# Patient Record
Sex: Male | Born: 1955
Health system: Southern US, Community
[De-identification: ages and names within clinical notes are randomized; demographics above are authoritative.]

## PROBLEM LIST (undated history)

## (undated) DIAGNOSIS — Z87442 Personal history of urinary calculi: Secondary | ICD-10-CM

## (undated) DIAGNOSIS — E1140 Type 2 diabetes mellitus with diabetic neuropathy, unspecified: Secondary | ICD-10-CM

## (undated) DIAGNOSIS — G709 Myoneural disorder, unspecified: Secondary | ICD-10-CM

## (undated) DIAGNOSIS — G629 Polyneuropathy, unspecified: Secondary | ICD-10-CM

## (undated) DIAGNOSIS — G8929 Other chronic pain: Secondary | ICD-10-CM

## (undated) DIAGNOSIS — E785 Hyperlipidemia, unspecified: Secondary | ICD-10-CM

## (undated) DIAGNOSIS — L4050 Arthropathic psoriasis, unspecified: Secondary | ICD-10-CM

## (undated) DIAGNOSIS — K759 Inflammatory liver disease, unspecified: Secondary | ICD-10-CM

## (undated) DIAGNOSIS — L309 Dermatitis, unspecified: Secondary | ICD-10-CM

## (undated) DIAGNOSIS — N200 Calculus of kidney: Secondary | ICD-10-CM

## (undated) DIAGNOSIS — I10 Essential (primary) hypertension: Secondary | ICD-10-CM

## (undated) DIAGNOSIS — G913 Post-traumatic hydrocephalus, unspecified: Secondary | ICD-10-CM

## (undated) DIAGNOSIS — R7989 Other specified abnormal findings of blood chemistry: Secondary | ICD-10-CM

## (undated) DIAGNOSIS — M1990 Unspecified osteoarthritis, unspecified site: Secondary | ICD-10-CM

## (undated) DIAGNOSIS — M19132 Post-traumatic osteoarthritis, left wrist: Secondary | ICD-10-CM

## (undated) DIAGNOSIS — R519 Headache, unspecified: Secondary | ICD-10-CM

## (undated) DIAGNOSIS — K219 Gastro-esophageal reflux disease without esophagitis: Secondary | ICD-10-CM

## (undated) DIAGNOSIS — T8859XA Other complications of anesthesia, initial encounter: Secondary | ICD-10-CM

## (undated) DIAGNOSIS — R931 Abnormal findings on diagnostic imaging of heart and coronary circulation: Secondary | ICD-10-CM

## (undated) DIAGNOSIS — K760 Fatty (change of) liver, not elsewhere classified: Secondary | ICD-10-CM

## (undated) DIAGNOSIS — K5792 Diverticulitis of intestine, part unspecified, without perforation or abscess without bleeding: Secondary | ICD-10-CM

## (undated) DIAGNOSIS — L409 Psoriasis, unspecified: Secondary | ICD-10-CM

## (undated) DIAGNOSIS — R51 Headache: Secondary | ICD-10-CM

## (undated) DIAGNOSIS — G4730 Sleep apnea, unspecified: Secondary | ICD-10-CM

## (undated) DIAGNOSIS — Z5189 Encounter for other specified aftercare: Secondary | ICD-10-CM

## (undated) DIAGNOSIS — F32A Depression, unspecified: Secondary | ICD-10-CM

## (undated) DIAGNOSIS — T4145XA Adverse effect of unspecified anesthetic, initial encounter: Secondary | ICD-10-CM

## (undated) DIAGNOSIS — F329 Major depressive disorder, single episode, unspecified: Secondary | ICD-10-CM

## (undated) DIAGNOSIS — G4700 Insomnia, unspecified: Secondary | ICD-10-CM

## (undated) DIAGNOSIS — K7460 Unspecified cirrhosis of liver: Secondary | ICD-10-CM

## (undated) DIAGNOSIS — K635 Polyp of colon: Secondary | ICD-10-CM

## (undated) DIAGNOSIS — K259 Gastric ulcer, unspecified as acute or chronic, without hemorrhage or perforation: Secondary | ICD-10-CM

## (undated) DIAGNOSIS — E349 Endocrine disorder, unspecified: Secondary | ICD-10-CM

## (undated) DIAGNOSIS — R945 Abnormal results of liver function studies: Secondary | ICD-10-CM

## (undated) HISTORY — PX: JOINT REPLACEMENT: SHX530

## (undated) HISTORY — DX: Fatty (change of) liver, not elsewhere classified: K76.0

## (undated) HISTORY — DX: Type 2 diabetes mellitus with diabetic neuropathy, unspecified: E11.40

## (undated) HISTORY — DX: Other specified abnormal findings of blood chemistry: R79.89

## (undated) HISTORY — PX: TONSILLECTOMY: SUR1361

## (undated) HISTORY — DX: Abnormal findings on diagnostic imaging of heart and coronary circulation: R93.1

## (undated) HISTORY — PX: BRAIN SURGERY: SHX531

## (undated) HISTORY — DX: Headache: R51

## (undated) HISTORY — DX: Headache, unspecified: R51.9

## (undated) HISTORY — DX: Unspecified cirrhosis of liver: K74.60

## (undated) HISTORY — PX: COLONOSCOPY: SHX174

## (undated) HISTORY — DX: Other chronic pain: G89.29

## (undated) HISTORY — DX: Morbid (severe) obesity due to excess calories: E66.01

## (undated) HISTORY — DX: Polyp of colon: K63.5

## (undated) HISTORY — DX: Abnormal results of liver function studies: R94.5

## (undated) HISTORY — DX: Post-traumatic osteoarthritis, left wrist: M19.132

## (undated) HISTORY — DX: Insomnia, unspecified: G47.00

## (undated) HISTORY — DX: Endocrine disorder, unspecified: E34.9

## (undated) HISTORY — DX: Hyperlipidemia, unspecified: E78.5

## (undated) HISTORY — DX: Encounter for other specified aftercare: Z51.89

## (undated) HISTORY — DX: Depression, unspecified: F32.A

## (undated) HISTORY — DX: Arthropathic psoriasis, unspecified: L40.50

## (undated) HISTORY — DX: Major depressive disorder, single episode, unspecified: F32.9

## (undated) HISTORY — DX: Diverticulitis of intestine, part unspecified, without perforation or abscess without bleeding: K57.92

---
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
## 1978-06-29 HISTORY — PX: BACK SURGERY: SHX140

## 2005-06-29 HISTORY — PX: VENTRICULOPERITONEAL SHUNT: SHX204

## 2008-06-10 ENCOUNTER — Emergency Department (HOSPITAL_BASED_OUTPATIENT_CLINIC_OR_DEPARTMENT_OTHER): Admission: EM | Admit: 2008-06-10 | Discharge: 2008-06-10 | Payer: Self-pay | Admitting: Emergency Medicine

## 2008-06-10 ENCOUNTER — Ambulatory Visit: Payer: Self-pay | Admitting: Interventional Radiology

## 2008-06-29 HISTORY — PX: SHOULDER SURGERY: SHX246

## 2009-01-07 ENCOUNTER — Ambulatory Visit (HOSPITAL_COMMUNITY): Admission: RE | Admit: 2009-01-07 | Discharge: 2009-01-07 | Payer: Self-pay | Admitting: Orthopaedic Surgery

## 2009-06-29 HISTORY — PX: TOTAL HIP ARTHROPLASTY: SHX124

## 2009-09-12 ENCOUNTER — Ambulatory Visit (HOSPITAL_COMMUNITY): Admission: RE | Admit: 2009-09-12 | Discharge: 2009-09-12 | Payer: Self-pay | Admitting: Orthopaedic Surgery

## 2009-12-25 ENCOUNTER — Encounter: Admission: RE | Admit: 2009-12-25 | Discharge: 2009-12-25 | Payer: Self-pay | Admitting: Orthopaedic Surgery

## 2010-01-21 ENCOUNTER — Inpatient Hospital Stay (HOSPITAL_COMMUNITY): Admission: RE | Admit: 2010-01-21 | Discharge: 2010-01-24 | Payer: Self-pay | Admitting: Orthopaedic Surgery

## 2010-09-13 LAB — CBC
HCT: 33.8 % — ABNORMAL LOW (ref 39.0–52.0)
HCT: 38 % — ABNORMAL LOW (ref 39.0–52.0)
Hemoglobin: 11.7 g/dL — ABNORMAL LOW (ref 13.0–17.0)
MCH: 31.8 pg (ref 26.0–34.0)
MCV: 93.4 fL (ref 78.0–100.0)
MCV: 93.4 fL (ref 78.0–100.0)
MCV: 94 fL (ref 78.0–100.0)
Platelets: 138 10*3/uL — ABNORMAL LOW (ref 150–400)
Platelets: 159 10*3/uL (ref 150–400)
Platelets: 167 10*3/uL (ref 150–400)
RBC: 3.62 MIL/uL — ABNORMAL LOW (ref 4.22–5.81)
RBC: 4.04 MIL/uL — ABNORMAL LOW (ref 4.22–5.81)
WBC: 9.1 10*3/uL (ref 4.0–10.5)

## 2010-09-13 LAB — DIFFERENTIAL
Basophils Relative: 0 % (ref 0–1)
Eosinophils Absolute: 0.2 10*3/uL (ref 0.0–0.7)
Lymphocytes Relative: 35 % (ref 12–46)
Lymphs Abs: 2.2 10*3/uL (ref 0.7–4.0)
Neutro Abs: 3.4 10*3/uL (ref 1.7–7.7)

## 2010-09-13 LAB — BASIC METABOLIC PANEL
BUN: 11 mg/dL (ref 6–23)
BUN: 15 mg/dL (ref 6–23)
CO2: 24 mEq/L (ref 19–32)
CO2: 25 mEq/L (ref 19–32)
Calcium: 8.3 mg/dL — ABNORMAL LOW (ref 8.4–10.5)
Chloride: 101 mEq/L (ref 96–112)
Chloride: 104 mEq/L (ref 96–112)
Chloride: 99 mEq/L (ref 96–112)
Creatinine, Ser: 1.02 mg/dL (ref 0.4–1.5)
GFR calc Af Amer: >60 mL/min (ref >60)
GFR calc Af Amer: >60 mL/min (ref >60)
GFR calc Af Amer: >60 mL/min (ref >60)
GFR calc Af Amer: >60 mL/min (ref >60)
GFR calc non Af Amer: >60 mL/min (ref >60)
Glucose, Bld: 122 mg/dL — ABNORMAL HIGH (ref 70–99)
Glucose, Bld: 125 mg/dL — ABNORMAL HIGH (ref 70–99)
Glucose, Bld: 95 mg/dL (ref 70–99)
Potassium: 4.2 mEq/L (ref 3.5–5.1)
Potassium: 4.3 mEq/L (ref 3.5–5.1)
Potassium: 4.3 mEq/L (ref 3.5–5.1)
Sodium: 135 mEq/L (ref 135–145)
Sodium: 137 mEq/L (ref 135–145)

## 2010-09-13 LAB — SURGICAL PCR SCREEN: Staphylococcus aureus: NEGATIVE

## 2010-09-13 LAB — CROSSMATCH
ABO/RH(D): B POS
Antibody Screen: NEGATIVE

## 2010-09-13 LAB — ABO/RH: ABO/RH(D): B POS

## 2010-09-13 LAB — APTT: aPTT: 30 seconds (ref 24–37)

## 2010-09-13 LAB — HEPATIC FUNCTION PANEL: Bilirubin, Direct: 0.2 mg/dL (ref 0.0–0.3)

## 2010-09-13 LAB — PROTIME-INR
INR: 0.98 (ref 0.00–1.49)
Prothrombin Time: 12.9 seconds (ref 11.6–15.2)

## 2011-04-03 LAB — URINALYSIS, ROUTINE W REFLEX MICROSCOPIC
Glucose, UA: NEGATIVE mg/dL
Hgb urine dipstick: NEGATIVE
Ketones, ur: NEGATIVE mg/dL
Specific Gravity, Urine: 1.024 (ref 1.005–1.030)
pH: 6 (ref 5.0–8.0)

## 2011-04-28 ENCOUNTER — Ambulatory Visit (INDEPENDENT_AMBULATORY_CARE_PROVIDER_SITE_OTHER): Payer: Self-pay | Admitting: Pharmacist

## 2011-04-28 ENCOUNTER — Encounter: Payer: Self-pay | Admitting: Pharmacist

## 2011-04-28 VITALS — BP 143/85 | HR 69 | Ht 71.0 in | Wt 266.3 lb

## 2011-04-28 DIAGNOSIS — K219 Gastro-esophageal reflux disease without esophagitis: Secondary | ICD-10-CM

## 2011-04-28 DIAGNOSIS — R339 Retention of urine, unspecified: Secondary | ICD-10-CM

## 2011-04-28 DIAGNOSIS — R519 Headache, unspecified: Secondary | ICD-10-CM | POA: Insufficient documentation

## 2011-04-28 DIAGNOSIS — E349 Endocrine disorder, unspecified: Secondary | ICD-10-CM | POA: Insufficient documentation

## 2011-04-28 DIAGNOSIS — R51 Headache: Secondary | ICD-10-CM

## 2011-04-28 DIAGNOSIS — I10 Essential (primary) hypertension: Secondary | ICD-10-CM

## 2011-04-28 DIAGNOSIS — E291 Testicular hypofunction: Secondary | ICD-10-CM

## 2011-04-28 DIAGNOSIS — L4050 Arthropathic psoriasis, unspecified: Secondary | ICD-10-CM

## 2011-04-28 HISTORY — DX: Endocrine disorder, unspecified: E34.9

## 2011-04-28 HISTORY — DX: Headache, unspecified: R51.9

## 2011-04-28 HISTORY — DX: Gastro-esophageal reflux disease without esophagitis: K21.9

## 2011-04-28 NOTE — Assessment & Plan Note (Addendum)
Pt is fairly knowledgeable on medication doses and indications, and he reports compliance with his medications.   Pt counseled to continue taking medications as prescribed . Pt able to afford medications and report Humira is working well.  Time spent with patient: 15 minute. Patient seen with Volanda Napoleon, PharmD Candidate and Ralene Bathe, Pharmacy Resident

## 2011-04-28 NOTE — Progress Notes (Signed)
  Subjective:    Patient ID: James [name], male    DOB: [birth year], 55 y.o.   MRN: [id number]  HPI Pleasant 19 yoM arrive to clinic for a medication review and update of diagnoses in [facility].    Pt  reports compliance with his medications an d states he can afford Humira and that it is working well for him.   Pt states he began having urinary retention problem after starting Cymbalta (duloxetine) for headaches.  Review of Systems     Objective:   Physical Exam        Assessment & Plan:  Medication reviewed no significant problems identified. Pt is fairly knowledgeable on medication doses and indications, and he reports compliance with his medications.   Encouraged to continue taking medications as prescribed.   Pt states he began having urinary retention problem after starting Cymbalta (duloxetine) for headaches.  He is currently taking Uroxatral (alfuzosin) for urination retention.  It is unclear if this is drug-induced or due to another cause (ie: BPH).  We counseled patient on other medication options for urinary retention, specifically Flomax (tamsulosin), that may have fewer side effect and be more cost-effective.  Pt will bring up this issue with PCP at next office visit.    Time spent with patient: 15 minute. Patient seen with Volanda Napoleon, PharmD Candidate and Ralene Bathe, Pharmacy Resident

## 2011-04-28 NOTE — Assessment & Plan Note (Addendum)
Pt states he began having urinary retention problem after starting Cymbalta (duloxetine) for headaches.  He is currently taking Uroxatral (alfuzosin) for urination retention.  It is unclear if this is drug-induced or due to another cause (ie: BPH).  We counseled patient on other medication options for urinary retention, specifically Flomax (tamsulosin), that may have fewer side effect and be more cost-effective.  Pt will bring up this issue with PCP at next office visit.

## 2011-04-28 NOTE — Patient Instructions (Signed)
Thanks for coming in today, even in the bad weather!  Please consider alternative to Uroxatrol (alfuzosin) - tamsulosin is another option for you.  It was nice to meet you.

## 2011-04-28 NOTE — Progress Notes (Signed)
  Subjective:    Patient ID: James [name], male    DOB: [birth year], 55 y.o.   MRN: [id number]  HPI Reviewed and agree with Dr. Graylin Shiver management.    Review of Systems     Objective:   Physical Exam        Assessment & Plan:

## 2011-07-28 DIAGNOSIS — Z982 Presence of cerebrospinal fluid drainage device: Secondary | ICD-10-CM

## 2011-07-28 HISTORY — DX: Presence of cerebrospinal fluid drainage device: Z98.2

## 2012-04-15 ENCOUNTER — Ambulatory Visit (INDEPENDENT_AMBULATORY_CARE_PROVIDER_SITE_OTHER): Payer: 59 | Admitting: Pharmacist

## 2012-04-15 ENCOUNTER — Encounter: Payer: Self-pay | Admitting: Pharmacist

## 2012-04-15 VITALS — BP 112/75 | HR 73 | Ht 71.0 in | Wt 270.2 lb

## 2012-04-15 DIAGNOSIS — L4050 Arthropathic psoriasis, unspecified: Secondary | ICD-10-CM

## 2012-04-15 NOTE — Progress Notes (Signed)
  Subjective:    Patient ID: James [name], male    DOB: April 23, 1956, 56 y.o.   MRN: [id number]  HPI Patient arrives in good spirits for medication review.   Reports seeing Lanier Prude as primary care provider ([place]).  Reports being diagnosed with psoriatic arthritis for about 30 years and states this is currently under an acceptable level of control.      Review of Systems     Objective:   Physical Exam        Assessment & Plan:  Following medication review, we suggested a change of his immediate release carvedilol to CR formulation since the patient prefers to take it once daily. Complete medication list provided to patient.  Total time in face to face medication review: 25 minutes.  Patient seen with: Wenda Low, PharmD Candidate and Dicky Doe, PharmD, Pharmacy Resident.

## 2012-04-15 NOTE — Progress Notes (Signed)
Patient ID: James [name], male   DOB: 14-Sep-1955, 56 y.o.   MRN: [id number] Reviewed and agree with Dr. Graylin Shiver management and documentation.

## 2012-04-15 NOTE — Patient Instructions (Addendum)
Thank you for coming in today for medication review. Please discuss with your primary care provider regarding changing your carvedilol to a longer-acting form known as carvedilol CR so that you may continue to take that once daily. Please give me a call if you have any questions regarding your medication. Have a great day!

## 2012-04-15 NOTE — Assessment & Plan Note (Signed)
Following medication review, no suggestions for change.  Complete medication list provided to patient.  Total time in face to face medication review: 10 minutes.  Patient seen with: Wenda Low, PharmD Candidate and Dicky Doe, PharmD, Pharmacy Resident.

## 2012-06-10 ENCOUNTER — Ambulatory Visit (HOSPITAL_COMMUNITY): Payer: Medicare Other | Attending: Cardiology

## 2012-06-10 ENCOUNTER — Ambulatory Visit (HOSPITAL_COMMUNITY): Payer: 59 | Attending: Cardiology | Admitting: Radiology

## 2012-06-10 ENCOUNTER — Other Ambulatory Visit (HOSPITAL_COMMUNITY): Payer: Self-pay | Admitting: Radiology

## 2012-06-10 ENCOUNTER — Encounter: Payer: Self-pay | Admitting: Cardiology

## 2012-06-10 DIAGNOSIS — R0602 Shortness of breath: Secondary | ICD-10-CM

## 2012-06-10 DIAGNOSIS — G4733 Obstructive sleep apnea (adult) (pediatric): Secondary | ICD-10-CM | POA: Insufficient documentation

## 2012-06-10 DIAGNOSIS — R079 Chest pain, unspecified: Secondary | ICD-10-CM | POA: Insufficient documentation

## 2012-06-10 DIAGNOSIS — R0609 Other forms of dyspnea: Secondary | ICD-10-CM | POA: Insufficient documentation

## 2012-06-10 DIAGNOSIS — R0989 Other specified symptoms and signs involving the circulatory and respiratory systems: Secondary | ICD-10-CM | POA: Insufficient documentation

## 2012-06-10 DIAGNOSIS — E785 Hyperlipidemia, unspecified: Secondary | ICD-10-CM | POA: Insufficient documentation

## 2012-06-10 DIAGNOSIS — I10 Essential (primary) hypertension: Secondary | ICD-10-CM | POA: Insufficient documentation

## 2012-06-10 DIAGNOSIS — Z8249 Family history of ischemic heart disease and other diseases of the circulatory system: Secondary | ICD-10-CM | POA: Insufficient documentation

## 2012-06-10 NOTE — Progress Notes (Signed)
Stress Echocardiogram performed.

## 2012-07-27 ENCOUNTER — Ambulatory Visit (HOSPITAL_COMMUNITY): Payer: 59 | Attending: Cardiology | Admitting: Radiology

## 2012-07-27 VITALS — BP 141/87 | Ht 71.5 in | Wt 270.0 lb

## 2012-07-27 DIAGNOSIS — R0989 Other specified symptoms and signs involving the circulatory and respiratory systems: Secondary | ICD-10-CM | POA: Insufficient documentation

## 2012-07-27 DIAGNOSIS — R0789 Other chest pain: Secondary | ICD-10-CM | POA: Insufficient documentation

## 2012-07-27 DIAGNOSIS — R0609 Other forms of dyspnea: Secondary | ICD-10-CM | POA: Insufficient documentation

## 2012-07-27 DIAGNOSIS — R079 Chest pain, unspecified: Secondary | ICD-10-CM

## 2012-07-27 DIAGNOSIS — I10 Essential (primary) hypertension: Secondary | ICD-10-CM | POA: Insufficient documentation

## 2012-07-27 MED ORDER — TECHNETIUM TC 99M SESTAMIBI GENERIC - CARDIOLITE
11.0000 | Freq: Once | INTRAVENOUS | Status: AC | PRN
  Administered 2012-07-27: 11 via INTRAVENOUS

## 2012-07-27 MED ORDER — REGADENOSON 0.4 MG/5ML IV SOLN
0.4000 mg | Freq: Once | INTRAVENOUS | Status: AC
  Administered 2012-07-27: 0.4 mg via INTRAVENOUS

## 2012-07-27 MED ORDER — TECHNETIUM TC 99M SESTAMIBI GENERIC - CARDIOLITE
33.0000 | Freq: Once | INTRAVENOUS | Status: AC | PRN
  Administered 2012-07-27: 33 via INTRAVENOUS

## 2012-07-27 NOTE — Progress Notes (Signed)
[facility] [address] [place], [place] [address] [phone]    Cardiology Nuclear Med Study  James [name] is a 57 y.o. male     MRN : [id number]     DOB: [birth year]  Procedure Date: 07/27/2012  Nuclear Med Background Indication for Stress Test:  Evaluation for Ischemia History:  06/14/12 STRESS ECHO: NL Cardiac Risk Factors: Family History - CAD, Hypertension and Lipids  Symptoms:  Chest Pain, Chest Pressure, DOE, Fatigue and SOB   Nuclear Pre-Procedure Caffeine/Decaff Intake:  None > 12 hrs NPO After: 8:00pm   Lungs:  clear O2 Sat: 95% on room air. IV 0.9% NS with Angio Cath:  20g  IV Site: R Antecubital x 1, tolerated well IV Started by:  Irven Baltimore, RN  Chest Size (in):  48 Cup Size: n/a  Height: 5' 11.5" (1.816 m)  Weight:  270 lb (122.471 kg)  BMI:  Body mass index is 37.13 kg/(m^2). Tech Comments:  Held Carvedilol x 36 hrs    Nuclear Med Study 1 or 2 day study: 1 day  Stress Test Type:  Treadmill/Lexiscan  Reading MD: Darlin Coco, MD  Order Authorizing Provider:  Rubie Maid, MD, and L. Beane, PAC  Resting Radionuclide: Technetium 8mSestamibi  Resting Radionuclide Dose: 11.0 mCi   Stress Radionuclide:  Technetium 986mestamibi  Stress Radionuclide Dose: 33.0 mCi           Stress Protocol Rest HR: 82 Stress HR: 125  Rest BP: 141/87 Stress BP: 151/90  Exercise Time (min): n/a METS: n/a   Predicted Max HR: 166 bpm % Max HR: 75.3 bpm Rate Pressure Product: [id number]  Dose of Adenosine (mg):  n/a Dose of Lexiscan: 0.4 mg  Dose of Atropine (mg): n/a Dose of Dobutamine: n/a mcg/kg/min (at max HR)  Stress Test Technologist: SaPerrin MalteseEMT-P  Nuclear Technologist:  ToAnnye RuskCNMT     Rest Procedure:  Myocardial perfusion imaging was performed at rest 45 minutes following the intravenous administration of Technetium 9911mstamibi. Rest ECG: NSR - Normal EKG  Stress Procedure:  The patient received IV  Lexiscan 0.4 mg over 15-seconds with concurrent low level exercise and then Technetium 65m30mtamibi was injected at 30-seconds while the patient continued walking one more minute. The patient had sob and was lt. Headed with LexiUnion Pacific Corporationuantitative spect images were obtained after a 45-minute delay. Stress ECG: No significant change from baseline ECG  QPS Raw Data Images:  Normal; no motion artifact; normal heart/lung ratio. Stress Images:  Normal homogeneous uptake in all areas of the myocardium. Rest Images:  Normal homogeneous uptake in all areas of the myocardium. Subtraction (SDS):  No evidence of ischemia. Transient Ischemic Dilatation (Normal <1.22):  1.08 Lung/Heart Ratio (Normal <0.45):  0.26  Quantitative Gated Spect Images QGS EDV:  118 ml QGS ESV:  56 ml  Impression Exercise Capacity:  Lexiscan with low level exercise. BP Response:  Normal blood pressure response. Clinical Symptoms:  There is dyspnea. ECG Impression:  No significant ST segment change suggestive of ischemia. Comparison with Prior Nuclear Study: No images to compare  Overall Impression:  Normal stress nuclear study.  LV Ejection Fraction: 52%.  LV Wall Motion:  NL LV Function; NL Wall Motion  [employer]

## 2012-07-28 ENCOUNTER — Encounter (HOSPITAL_COMMUNITY): Payer: Self-pay | Admitting: Family Medicine

## 2013-03-05 ENCOUNTER — Emergency Department (HOSPITAL_BASED_OUTPATIENT_CLINIC_OR_DEPARTMENT_OTHER)
Admission: EM | Admit: 2013-03-05 | Discharge: 2013-03-05 | Disposition: A | Discharge status: Home / Self Care | Payer: 59 | Attending: Emergency Medicine | Admitting: Emergency Medicine

## 2013-03-05 ENCOUNTER — Encounter (HOSPITAL_BASED_OUTPATIENT_CLINIC_OR_DEPARTMENT_OTHER): Payer: Self-pay | Admitting: *Deleted

## 2013-03-05 DIAGNOSIS — E119 Type 2 diabetes mellitus without complications: Secondary | ICD-10-CM | POA: Insufficient documentation

## 2013-03-05 DIAGNOSIS — H16139 Photokeratitis, unspecified eye: Secondary | ICD-10-CM | POA: Insufficient documentation

## 2013-03-05 DIAGNOSIS — Z8669 Personal history of other diseases of the nervous system and sense organs: Secondary | ICD-10-CM | POA: Insufficient documentation

## 2013-03-05 DIAGNOSIS — Z79899 Other long term (current) drug therapy: Secondary | ICD-10-CM | POA: Insufficient documentation

## 2013-03-05 DIAGNOSIS — I10 Essential (primary) hypertension: Secondary | ICD-10-CM | POA: Insufficient documentation

## 2013-03-05 DIAGNOSIS — H16133 Photokeratitis, bilateral: Secondary | ICD-10-CM

## 2013-03-05 DIAGNOSIS — K219 Gastro-esophageal reflux disease without esophagitis: Secondary | ICD-10-CM | POA: Insufficient documentation

## 2013-03-05 DIAGNOSIS — Z8711 Personal history of peptic ulcer disease: Secondary | ICD-10-CM | POA: Insufficient documentation

## 2013-03-05 HISTORY — DX: Gastro-esophageal reflux disease without esophagitis: K21.9

## 2013-03-05 HISTORY — DX: Gastric ulcer, unspecified as acute or chronic, without hemorrhage or perforation: K25.9

## 2013-03-05 HISTORY — DX: Essential (primary) hypertension: I10

## 2013-03-05 MED ORDER — ERYTHROMYCIN 5 MG/GM OP OINT
TOPICAL_OINTMENT | OPHTHALMIC | Status: AC
  Filled 2013-03-05: qty 3.5

## 2013-03-05 MED ORDER — TETRACAINE HCL 0.5 % OP SOLN
OPHTHALMIC | Status: AC
  Administered 2013-03-05: 08:00:00
  Filled 2013-03-05: qty 2

## 2013-03-05 MED ORDER — ERYTHROMYCIN 5 MG/GM OP OINT
TOPICAL_OINTMENT | Freq: Every day | OPHTHALMIC | Status: DC
  Administered 2013-03-05: 09:00:00 via OPHTHALMIC

## 2013-03-05 MED ORDER — FLUORESCEIN SODIUM 1 MG OP STRP
ORAL_STRIP | OPHTHALMIC | Status: AC
  Administered 2013-03-05: 08:00:00
  Filled 2013-03-05: qty 1

## 2013-03-05 NOTE — ED Provider Notes (Signed)
CSN: [id number]     Arrival date & time 03/05/13  4854 History   First MD Initiated Contact with Patient 03/05/13 0700     Chief Complaint  Patient presents with  . Eye Pain   (Consider location/radiation/quality/duration/timing/severity/associated sxs/prior Treatment) HPI Comments: Patient complains of onset of bilateral eye burning and watering awoke him from sleep at about 0400 am.  He went to sleep around 0200 and did not have symptoms at that time. He has no known exposures to [employer], soldering, or sick contacts, but did install a uv light for raising fish last night. He states his vision is blurry.  No fever or chills, some rhinorrhea but no coughing sneezing, or fever.    Patient is a 57 y.o. male presenting with eye pain. The history is provided by the patient.  Eye Pain This is a new problem. The current episode started 1 to 2 hours ago. The problem occurs constantly. The problem has not changed since onset.Exacerbated by: Worsened by light.  Nothing relieves the symptoms. He has tried nothing for the symptoms. The treatment provided no relief.    Past Medical History  Diagnosis Date  . Hypertension   . Diabetes mellitus without complication     borderline per primary MD  . Stomach ulcer   . Acid reflux   . Neuropathy of both feet    Past Surgical History  Procedure Laterality Date  . Brain stent    . Total hip arthroplasty    . Shoulder surgery Left   . Back surgery     History reviewed. No pertinent family history. History  Substance Use Topics  . Smoking status: Never Smoker   . Smokeless tobacco: Never Used     Comment: Tried when younger.    . Alcohol Use: No    Review of Systems  Eyes: Positive for pain.  All other systems reviewed and are negative.    Allergies  Morphine and related and Sulfa drugs cross reactors  Home Medications   Current Outpatient Rx  Name  Route  Sig  Dispense  Refill  . Armodafinil (NUVIGIL) 250 MG  tablet   Oral   Take 250 mg by mouth daily.         Marland Kitchen gabapentin (NEURONTIN) 100 MG capsule   Oral   Take 100 mg by mouth 3 (three) times daily.         . metFORMIN (GLUCOPHAGE) 500 MG tablet   Oral   Take 500 mg by mouth daily.         Marland Kitchen testosterone cypionate (DEPOTESTOTERONE CYPIONATE) 200 MG/ML injection   Intramuscular   Inject into the muscle every 14 (fourteen) days.         Marland Kitchen adalimumab (HUMIRA) 40 MG/0.8ML injection   Subcutaneous   Inject 0.8 mLs (40 mg total) into the skin every 14 (fourteen) days.         Marland Kitchen alfuzosin (UROXATRAL) 10 MG 24 hr tablet   Oral   Take 1 tablet (10 mg total) by mouth daily. Take 1 tablet (65m total) by mouth daily with food.         . carvedilol (COREG) 6.25 MG tablet   Oral   Take 1 tablet (6.25 mg total) by mouth 2 (two) times daily.         . DULoxetine (CYMBALTA) 60 MG capsule   Oral   Take 1 capsule (60 mg total) by mouth daily. Take 2 capsule by mouth once daily         .  losartan (COZAAR) 100 MG tablet   Oral   Take 1 tablet (100 mg total) by mouth daily.         . Naproxen Sodium (ALEVE) 220 MG CAPS   Oral   Take 220 mg by mouth at bedtime as needed.         Marland Kitchen omeprazole (PRILOSEC) 40 MG capsule   Oral   Take 1 capsule (40 mg total) by mouth 2 (two) times daily.         . Testosterone 30 MG/ACT SOLN   Transdermal   Place 30 mg onto the skin daily.          BP 120/83  Pulse 76  Temp(Src) 98 F (36.7 C) (Oral)  Resp 20  Ht 5' 11"  (1.803 m)  Wt 270 lb (122.471 kg)  BMI 37.67 kg/m2  SpO2 97% Physical Exam  Nursing note and vitals reviewed. Constitutional: He appears well-developed and well-nourished.  HENT:  Head: Normocephalic and atraumatic.  Right Ear: External ear normal.  Left Ear: External ear normal.  Eyes: EOM and lids are normal. Pupils are equal, round, and reactive to light. Right conjunctiva is injected. Left conjunctiva is injected.  Slit lamp exam:      The right eye  shows fluorescein uptake. The right eye shows no corneal abrasion, no corneal ulcer, no foreign body, no hyphema and no hypopyon.       The left eye shows no corneal abrasion, no corneal ulcer, no foreign body, no hyphema, no hypopyon and no fluorescein uptake.   mildDiffuse corneal edema, right cornea with some 6 o clock fluorescein uptake.   Visual acuity od 20/30 os 20/40  Neck: Normal range of motion. Neck supple.    ED Course  Procedures (including critical care time) Labs Review Labs Reviewed - No data to display Imaging Review No results found.  MDM  Patient with exposure to uv light and exam consistent with solar UV photokeratitis.  Patient will be treated with oral analgesics and lubricant ointment.  Patient advised to follow up with his eye doctor in 48 hours, avoid light, and future eye protection.     Shaune Pollack, MD 03/05/13 [phone]

## 2013-03-05 NOTE — ED Notes (Signed)
Patient states he woke this A.M. To eye pain, unable to open eyelids w/out pain. Patient is accompanied by wife who states patient may be having reaction to meds. Patient's wife has large bag of meds. I asked her to explain to nurse. I took patient by wheelchair to get a visual acuity test. Patient was unable to open eyelids due to pain. Patient states he has no other pain.

## 2013-03-29 DIAGNOSIS — K5792 Diverticulitis of intestine, part unspecified, without perforation or abscess without bleeding: Secondary | ICD-10-CM | POA: Insufficient documentation

## 2013-03-29 HISTORY — DX: Diverticulitis of intestine, part unspecified, without perforation or abscess without bleeding: K57.92

## 2013-04-24 ENCOUNTER — Encounter (HOSPITAL_BASED_OUTPATIENT_CLINIC_OR_DEPARTMENT_OTHER): Payer: Self-pay | Admitting: Emergency Medicine

## 2013-04-24 DIAGNOSIS — K219 Gastro-esophageal reflux disease without esophagitis: Secondary | ICD-10-CM | POA: Diagnosis present

## 2013-04-24 DIAGNOSIS — I10 Essential (primary) hypertension: Secondary | ICD-10-CM | POA: Diagnosis present

## 2013-04-24 DIAGNOSIS — G5790 Unspecified mononeuropathy of unspecified lower limb: Secondary | ICD-10-CM | POA: Diagnosis present

## 2013-04-24 DIAGNOSIS — K7689 Other specified diseases of liver: Secondary | ICD-10-CM | POA: Diagnosis present

## 2013-04-24 DIAGNOSIS — Z96649 Presence of unspecified artificial hip joint: Secondary | ICD-10-CM

## 2013-04-24 DIAGNOSIS — L259 Unspecified contact dermatitis, unspecified cause: Secondary | ICD-10-CM | POA: Diagnosis present

## 2013-04-24 DIAGNOSIS — K5732 Diverticulitis of large intestine without perforation or abscess without bleeding: Secondary | ICD-10-CM | POA: Diagnosis present

## 2013-04-24 DIAGNOSIS — E119 Type 2 diabetes mellitus without complications: Secondary | ICD-10-CM | POA: Diagnosis present

## 2013-04-24 DIAGNOSIS — Z982 Presence of cerebrospinal fluid drainage device: Secondary | ICD-10-CM

## 2013-04-24 DIAGNOSIS — G4700 Insomnia, unspecified: Secondary | ICD-10-CM | POA: Diagnosis present

## 2013-04-24 DIAGNOSIS — Z79899 Other long term (current) drug therapy: Secondary | ICD-10-CM

## 2013-04-24 DIAGNOSIS — A419 Sepsis, unspecified organism: Principal | ICD-10-CM | POA: Diagnosis present

## 2013-04-24 DIAGNOSIS — L4050 Arthropathic psoriasis, unspecified: Secondary | ICD-10-CM | POA: Diagnosis present

## 2013-04-24 LAB — URINALYSIS, ROUTINE W REFLEX MICROSCOPIC
Bilirubin Urine: NEGATIVE
Hgb urine dipstick: NEGATIVE
Nitrite: NEGATIVE
Specific Gravity, Urine: 1.019 (ref 1.005–1.030)
Urobilinogen, UA: 1 mg/dL (ref 0.0–1.0)
pH: 5.5 (ref 5.0–8.0)

## 2013-04-24 NOTE — ED Notes (Signed)
Suprapubic pain since 5pm.

## 2013-04-25 ENCOUNTER — Encounter (HOSPITAL_BASED_OUTPATIENT_CLINIC_OR_DEPARTMENT_OTHER): Payer: Self-pay | Admitting: Emergency Medicine

## 2013-04-25 ENCOUNTER — Emergency Department (HOSPITAL_BASED_OUTPATIENT_CLINIC_OR_DEPARTMENT_OTHER): Payer: 59

## 2013-04-25 ENCOUNTER — Inpatient Hospital Stay (HOSPITAL_BASED_OUTPATIENT_CLINIC_OR_DEPARTMENT_OTHER)
Admission: EM | Admit: 2013-04-25 | Discharge: 2013-04-28 | DRG: 872 | Disposition: A | Discharge status: Home / Self Care | Payer: 59 | Attending: Internal Medicine | Admitting: Internal Medicine

## 2013-04-25 DIAGNOSIS — G4700 Insomnia, unspecified: Secondary | ICD-10-CM

## 2013-04-25 DIAGNOSIS — K5732 Diverticulitis of large intestine without perforation or abscess without bleeding: Secondary | ICD-10-CM

## 2013-04-25 DIAGNOSIS — K7460 Unspecified cirrhosis of liver: Secondary | ICD-10-CM

## 2013-04-25 DIAGNOSIS — E119 Type 2 diabetes mellitus without complications: Secondary | ICD-10-CM

## 2013-04-25 DIAGNOSIS — R51 Headache: Secondary | ICD-10-CM

## 2013-04-25 DIAGNOSIS — L4050 Arthropathic psoriasis, unspecified: Secondary | ICD-10-CM

## 2013-04-25 DIAGNOSIS — K219 Gastro-esophageal reflux disease without esophagitis: Secondary | ICD-10-CM

## 2013-04-25 DIAGNOSIS — E349 Endocrine disorder, unspecified: Secondary | ICD-10-CM

## 2013-04-25 DIAGNOSIS — I10 Essential (primary) hypertension: Secondary | ICD-10-CM

## 2013-04-25 DIAGNOSIS — R339 Retention of urine, unspecified: Secondary | ICD-10-CM

## 2013-04-25 DIAGNOSIS — A419 Sepsis, unspecified organism: Secondary | ICD-10-CM

## 2013-04-25 HISTORY — DX: Dermatitis, unspecified: L30.9

## 2013-04-25 HISTORY — DX: Psoriasis, unspecified: L40.9

## 2013-04-25 HISTORY — DX: Calculus of kidney: N20.0

## 2013-04-25 HISTORY — DX: Type 2 diabetes mellitus without complications: E11.9

## 2013-04-25 HISTORY — DX: Diverticulitis of large intestine without perforation or abscess without bleeding: K57.32

## 2013-04-25 HISTORY — DX: Post-traumatic hydrocephalus, unspecified: G91.3

## 2013-04-25 LAB — URINALYSIS, ROUTINE W REFLEX MICROSCOPIC
Bilirubin Urine: NEGATIVE
Glucose, UA: NEGATIVE mg/dL
Hgb urine dipstick: NEGATIVE
Specific Gravity, Urine: 1.031 — ABNORMAL HIGH (ref 1.005–1.030)
pH: 5.5 (ref 5.0–8.0)

## 2013-04-25 LAB — HEPATITIS PANEL, ACUTE
HCV Ab: NEGATIVE
Hepatitis B Surface Ag: NEGATIVE

## 2013-04-25 LAB — COMPREHENSIVE METABOLIC PANEL
AST: 42 U/L — ABNORMAL HIGH (ref 0–37)
Albumin: 4 g/dL (ref 3.5–5.2)
Alkaline Phosphatase: 118 U/L — ABNORMAL HIGH (ref 39–117)
BUN: 17 mg/dL (ref 6–23)
CO2: 26 mEq/L (ref 19–32)
Calcium: 10.3 mg/dL (ref 8.4–10.5)
Chloride: 98 mEq/L (ref 96–112)
GFR calc non Af Amer: 74 mL/min — ABNORMAL LOW (ref >90)
Glucose, Bld: 152 mg/dL — ABNORMAL HIGH (ref 70–99)
Potassium: 4.7 mEq/L (ref 3.5–5.1)
Total Bilirubin: 1.3 mg/dL — ABNORMAL HIGH (ref 0.3–1.2)

## 2013-04-25 LAB — CBC WITH DIFFERENTIAL/PLATELET
Basophils Relative: 0 % (ref 0–1)
Eosinophils Relative: 1 % (ref 0–5)
HCT: 46.8 % (ref 39.0–52.0)
Hemoglobin: 16.3 g/dL (ref 13.0–17.0)
Lymphocytes Relative: 13 % (ref 12–46)
MCHC: 34.8 g/dL (ref 30.0–36.0)
Monocytes Absolute: 1.4 10*3/uL — ABNORMAL HIGH (ref 0.1–1.0)
Monocytes Relative: 9 % (ref 3–12)
Neutro Abs: 11.8 10*3/uL — ABNORMAL HIGH (ref 1.7–7.7)
Neutrophils Relative %: 77 % (ref 43–77)
RBC: 5.23 MIL/uL (ref 4.22–5.81)
WBC: 15.4 10*3/uL — ABNORMAL HIGH (ref 4.0–10.5)

## 2013-04-25 LAB — GLUCOSE, CAPILLARY
Glucose-Capillary: 179 mg/dL — ABNORMAL HIGH (ref 70–99)
Glucose-Capillary: 152 mg/dL — ABNORMAL HIGH (ref 70–99)

## 2013-04-25 LAB — LACTIC ACID, PLASMA: Lactic Acid, Venous: 1.6 mmol/L (ref 0.5–2.2)

## 2013-04-25 LAB — HEMOGLOBIN A1C
Hgb A1c MFr Bld: 7.7 % — ABNORMAL HIGH (ref <5.7)
Mean Plasma Glucose: 174 mg/dL — ABNORMAL HIGH (ref <117)

## 2013-04-25 MED ORDER — SODIUM CHLORIDE 0.9 % IV SOLN
500.0000 mg | Freq: Once | INTRAVENOUS | Status: AC
  Administered 2013-04-25: 500 mg via INTRAVENOUS

## 2013-04-25 MED ORDER — PIPERACILLIN-TAZOBACTAM 3.375 G IVPB
3.3750 g | Freq: Three times a day (TID) | INTRAVENOUS | Status: DC
  Administered 2013-04-25 – 2013-04-27 (×6): 3.375 g via INTRAVENOUS
  Filled 2013-04-25 (×9): qty 50

## 2013-04-25 MED ORDER — IOHEXOL 300 MG/ML  SOLN
50.0000 mL | Freq: Once | INTRAMUSCULAR | Status: AC | PRN
  Administered 2013-04-25: 50 mL via ORAL

## 2013-04-25 MED ORDER — GABAPENTIN 100 MG PO CAPS
100.0000 mg | ORAL_CAPSULE | Freq: Three times a day (TID) | ORAL | Status: DC
  Administered 2013-04-25 – 2013-04-28 (×10): 100 mg via ORAL
  Filled 2013-04-25 (×14): qty 1

## 2013-04-25 MED ORDER — ONDANSETRON HCL 4 MG/2ML IJ SOLN: 4.0000 mg | Freq: Four times a day (QID) | INTRAMUSCULAR | Status: DC | PRN

## 2013-04-25 MED ORDER — FENTANYL CITRATE 0.05 MG/ML IJ SOLN: 200.0000 ug | Freq: Once | INTRAMUSCULAR | Status: DC

## 2013-04-25 MED ORDER — VANCOMYCIN HCL IN DEXTROSE 1-5 GM/200ML-% IV SOLN
1000.0000 mg | Freq: Three times a day (TID) | INTRAVENOUS | Status: DC
  Administered 2013-04-25 – 2013-04-26 (×2): 1000 mg via INTRAVENOUS
  Filled 2013-04-25 (×4): qty 200

## 2013-04-25 MED ORDER — ACETAMINOPHEN 325 MG PO TABS: 650.0000 mg | ORAL_TABLET | Freq: Four times a day (QID) | ORAL | Status: DC | PRN

## 2013-04-25 MED ORDER — HYDROMORPHONE HCL PF 1 MG/ML IJ SOLN
0.5000 mg | INTRAMUSCULAR | Status: DC | PRN
  Administered 2013-04-25 (×2): 1 mg via INTRAVENOUS
  Filled 2013-04-25 (×2): qty 1

## 2013-04-25 MED ORDER — FENTANYL CITRATE 0.05 MG/ML IJ SOLN
100.0000 ug | INTRAMUSCULAR | Status: DC | PRN
  Administered 2013-04-25 (×4): 100 ug via INTRAVENOUS
  Filled 2013-04-25 (×4): qty 2

## 2013-04-25 MED ORDER — INSULIN ASPART 100 UNIT/ML ~~LOC~~ SOLN
0.0000 [IU] | SUBCUTANEOUS | Status: DC
  Administered 2013-04-25: 2 [IU] via SUBCUTANEOUS
  Administered 2013-04-25 (×2): 1 [IU] via SUBCUTANEOUS
  Administered 2013-04-25: 2 [IU] via SUBCUTANEOUS
  Administered 2013-04-26 (×3): 1 [IU] via SUBCUTANEOUS

## 2013-04-25 MED ORDER — SODIUM CHLORIDE 0.9 % IV SOLN
INTRAVENOUS | Status: AC
  Administered 2013-04-25: 500 mg via INTRAVENOUS
  Filled 2013-04-25: qty 500

## 2013-04-25 MED ORDER — FENTANYL CITRATE 0.05 MG/ML IJ SOLN
INTRAMUSCULAR | Status: AC
  Filled 2013-04-25: qty 4

## 2013-04-25 MED ORDER — DOCUSATE SODIUM 100 MG PO CAPS
100.0000 mg | ORAL_CAPSULE | Freq: Two times a day (BID) | ORAL | Status: DC
  Administered 2013-04-25 – 2013-04-28 (×7): 100 mg via ORAL
  Filled 2013-04-25 (×7): qty 1

## 2013-04-25 MED ORDER — ONDANSETRON HCL 4 MG/2ML IJ SOLN: 4.0000 mg | Freq: Once | INTRAMUSCULAR | Status: DC

## 2013-04-25 MED ORDER — SODIUM CHLORIDE 0.9 % IV SOLN
INTRAVENOUS | Status: DC
  Administered 2013-04-26 (×2): via INTRAVENOUS

## 2013-04-25 MED ORDER — PIPERACILLIN-TAZOBACTAM 3.375 G IVPB 30 MIN
3.3750 g | Freq: Once | INTRAVENOUS | Status: DC
  Filled 2013-04-25: qty 50

## 2013-04-25 MED ORDER — DULOXETINE HCL 60 MG PO CPEP
60.0000 mg | ORAL_CAPSULE | Freq: Every day | ORAL | Status: DC
  Administered 2013-04-25 – 2013-04-27 (×3): 60 mg via ORAL
  Filled 2013-04-25 (×6): qty 1

## 2013-04-25 MED ORDER — ACETAMINOPHEN 650 MG RE SUPP: 650.0000 mg | Freq: Four times a day (QID) | RECTAL | Status: DC | PRN

## 2013-04-25 MED ORDER — SODIUM CHLORIDE 0.9 % IV SOLN
INTRAVENOUS | Status: DC
  Administered 2013-04-25 – 2013-04-26 (×3): via INTRAVENOUS

## 2013-04-25 MED ORDER — IOHEXOL 300 MG/ML  SOLN
100.0000 mL | Freq: Once | INTRAMUSCULAR | Status: AC | PRN
  Administered 2013-04-25: 100 mL via INTRAVENOUS

## 2013-04-25 MED ORDER — SODIUM CHLORIDE 0.9 % IV BOLUS (SEPSIS)
3000.0000 mL | Freq: Once | INTRAVENOUS | Status: AC
  Administered 2013-04-25: 3000 mL via INTRAVENOUS

## 2013-04-25 MED ORDER — HYDROMORPHONE HCL PF 1 MG/ML IJ SOLN
1.0000 mg | INTRAMUSCULAR | Status: DC | PRN
  Administered 2013-04-25 – 2013-04-27 (×9): 1 mg via INTRAVENOUS
  Filled 2013-04-25 (×9): qty 1

## 2013-04-25 MED ORDER — HYDROMORPHONE HCL PF 1 MG/ML IJ SOLN: 1.0000 mg | INTRAMUSCULAR | Status: DC | PRN

## 2013-04-25 MED ORDER — HYDROCODONE-ACETAMINOPHEN 5-325 MG PO TABS: 1.0000 | ORAL_TABLET | ORAL | Status: DC | PRN

## 2013-04-25 MED ORDER — SODIUM CHLORIDE 0.9 % IV SOLN
INTRAVENOUS | Status: DC
  Administered 2013-04-25: 02:00:00 via INTRAVENOUS

## 2013-04-25 MED ORDER — DULOXETINE HCL 60 MG PO CPEP
60.0000 mg | ORAL_CAPSULE | Freq: Every day | ORAL | Status: DC
Start: 2013-04-25 — End: 2013-04-25
  Administered 2013-04-25: 60 mg via ORAL
  Filled 2013-04-25: qty 1

## 2013-04-25 MED ORDER — PANTOPRAZOLE SODIUM 40 MG IV SOLR
40.0000 mg | Freq: Every day | INTRAVENOUS | Status: DC
  Administered 2013-04-25: 40 mg via INTRAVENOUS
  Filled 2013-04-25 (×2): qty 40

## 2013-04-25 MED ORDER — ONDANSETRON HCL 4 MG PO TABS: 4.0000 mg | ORAL_TABLET | Freq: Four times a day (QID) | ORAL | Status: DC | PRN

## 2013-04-25 MED ORDER — FENTANYL CITRATE 0.05 MG/ML IJ SOLN: 100.0000 ug | Freq: Once | INTRAMUSCULAR | Status: DC

## 2013-04-25 MED ORDER — FENTANYL CITRATE 0.05 MG/ML IJ SOLN
200.0000 ug | Freq: Once | INTRAMUSCULAR | Status: AC
  Administered 2013-04-25: 200 ug via INTRAMUSCULAR

## 2013-04-25 MED ORDER — LOSARTAN POTASSIUM 50 MG PO TABS
100.0000 mg | ORAL_TABLET | Freq: Every day | ORAL | Status: DC
  Administered 2013-04-25 – 2013-04-28 (×4): 100 mg via ORAL
  Filled 2013-04-25 (×4): qty 2

## 2013-04-25 MED ORDER — CARVEDILOL 6.25 MG PO TABS
6.2500 mg | ORAL_TABLET | Freq: Two times a day (BID) | ORAL | Status: DC
  Administered 2013-04-25 – 2013-04-28 (×7): 6.25 mg via ORAL
  Filled 2013-04-25 (×10): qty 1

## 2013-04-25 MED ORDER — ONDANSETRON HCL 4 MG/2ML IJ SOLN: 4.0000 mg | Freq: Three times a day (TID) | INTRAMUSCULAR | Status: DC | PRN

## 2013-04-25 NOTE — ED Notes (Signed)
MD at bedside. 

## 2013-04-25 NOTE — Progress Notes (Signed)
  Patient is 57 year old male with history of traumatic hydrocephalus requiring VP shunt placement, presented to emergency department with main concern of persistent, worsening suprapubic abdominal pain that started several hours prior to admission, with no specific alleviating or aggravating factors, no similar events in the past. Patient did report subjective fevers and chills but has not checked it. In emergency department he was noted fever low-grade of 99.5 F. CT of the abdomen and pelvis showed acute sigmoid diverticulitis without bowel perforation or abscess. Patient was given ertapenem in emergency department. Medical floor requested. Admission accepted  Faye Ramsay, MD  [facility] Pager [phone]  If 7PM-7AM, please contact night-coverage [email or web address] Password [email or web address]

## 2013-04-25 NOTE — ED Notes (Signed)
Patient is resting comfortably. 

## 2013-04-25 NOTE — ED Notes (Signed)
Family at bedside. 

## 2013-04-25 NOTE — H&P (Addendum)
PCP:   Manfred Shirts    Chief Complaint:  Abdominal pain  HPI: James [name] is a 57 y.o. male   has a past medical history of Hypertension; Diabetes mellitus without complication; Stomach ulcer; Acid reflux; Neuropathy of both feet; Kidney stone; Post-traumatic hydrocephalus; Psoriasis; and Eczema.   Presented with  He started to have sever supra pubic pain and presented to [facility]. He was having low grade fever and some chills. Denies nausea vomiting or diarrhea. CT scan showed Acute sigmoid diverticulitis, without bowel perforation or abscess. Suspected adjacent epiploic appendagitis. Patient has been transferred to [facility] under hospitalist service. He was started on Imepenem in ED.  Of note patient is sp ventricular shunt due to traumatic hydrocephalus.    Review of Systems:    Pertinent positives include: Fevers, chills, abdominal pain,   Constitutional:  No weight loss, night sweats,  fatigue, weight loss  HEENT:  No headaches, Difficulty swallowing,Tooth/dental problems,Sore throat,  No sneezing, itching, ear ache, nasal congestion, post nasal drip,  Cardio-vascular:  No chest pain, Orthopnea, PND, anasarca, dizziness, palpitations.no Bilateral lower extremity swelling  GI:  No heartburn, indigestion, nausea, vomiting, diarrhea, change in bowel habits, loss of appetite, melena, blood in stool, hematemesis Resp:  no shortness of breath at rest. No dyspnea on exertion, No excess mucus, no productive cough, No non-productive cough, No coughing up of blood.No change in color of mucus.No wheezing. Skin:  no rash or lesions. No jaundice GU:  no dysuria, change in color of urine, no urgency or frequency. No straining to urinate.  No flank pain.  Musculoskeletal:  No joint pain or no joint swelling. No decreased range of motion. No back pain.  Psych:  No change in mood or affect. No depression or anxiety. No memory loss.  Neuro: no localizing neurological complaints, no tingling, no  weakness, no double vision, no gait abnormality, no slurred speech, no confusion  Otherwise ROS are negative except for above, 10 systems were reviewed  Past Medical History: Past Medical History  Diagnosis Date  . Hypertension   . Diabetes mellitus without complication     borderline per primary MD  . Stomach ulcer   . Acid reflux   . Neuropathy of both feet   . Kidney stone   . Post-traumatic hydrocephalus   . Psoriasis   . Eczema    Past Surgical History  Procedure Laterality Date  . Total hip arthroplasty    . Shoulder surgery Left   . Back surgery    . Ventriculoperitoneal shunt       Medications: Prior to Admission medications   Medication Sig Start Date End Date Taking? Authorizing Provider  adalimumab (HUMIRA) 40 MG/0.8ML injection Inject 0.8 mLs (40 mg total) into the skin every 14 (fourteen) days. 04/28/11   Zigmund Gottron, MD  alfuzosin (UROXATRAL) 10 MG 24 hr tablet Take 1 tablet (10 mg total) by mouth daily. Take 1 tablet (39m total) by mouth daily with food. 04/28/11   WZigmund Gottron MD  Armodafinil (NUVIGIL) 250 MG tablet Take 250 mg by mouth daily.    Historical Provider, MD  carvedilol (COREG) 6.25 MG tablet Take 1 tablet (6.25 mg total) by mouth 2 (two) times daily. 04/28/11   WZigmund Gottron MD  DULoxetine (CYMBALTA) 60 MG capsule Take 1 capsule (60 mg total) by mouth daily. Take 2 capsule by mouth once daily 04/28/11   WZigmund Gottron MD  gabapentin (NEURONTIN) 100 MG capsule Take 100 mg by mouth  3 (three) times daily.    Historical Provider, MD  losartan (COZAAR) 100 MG tablet Take 1 tablet (100 mg total) by mouth daily. 04/28/11   Zigmund Gottron, MD  metFORMIN (GLUCOPHAGE) 500 MG tablet Take 500 mg by mouth daily.    Historical Provider, MD  Naproxen Sodium (ALEVE) 220 MG CAPS Take 220 mg by mouth at bedtime as needed.    Historical Provider, MD  omeprazole (PRILOSEC) 40 MG capsule Take 1 capsule (40 mg total) by mouth  2 (two) times daily. 04/28/11   Zigmund Gottron, MD  Testosterone 30 MG/ACT SOLN Place 30 mg onto the skin daily. 04/28/11   Zigmund Gottron, MD  testosterone cypionate (DEPOTESTOTERONE CYPIONATE) 200 MG/ML injection Inject into the muscle every 14 (fourteen) days.    Historical Provider, MD    Allergies:   Allergies  Allergen Reactions  . Morphine And Related Other (See Comments)    hallucinations  . Sulfa Drugs Cross Reactors Rash    Social History:  Ambulatory  Independently  Lives at home with wife   reports that he has never smoked. He has never used smokeless tobacco. He reports that he does not drink alcohol or use illicit drugs.   Family History: family history includes Heart disease in his brother; Lung cancer in his father.    Physical Exam: Patient Vitals for the past 24 hrs:  BP Temp Temp src Pulse Resp SpO2 Height Weight  04/25/13 0515 136/79 mmHg 100.2 F (37.9 C) Oral 94 20 97 % 5' 11"  (1.803 m) 121.4 kg (267 lb 10.2 oz)  04/25/13 0307 130/80 mmHg 100.3 F (37.9 C) Oral 104 20 96 % - -  04/24/13 2302 156/85 mmHg 101 F (38.3 C) Oral 96 16 97 % 5' 11"  (1.803 m) 122.471 kg (270 lb)    1. General:  in No Acute distress 2. Psychological: Alert and  Oriented 3. Head/ENT:   Moist Mucous Membranes                          Head Non traumatic, neck supple                          NormalDentition 4. SKIN: decreased Skin turgor,  Skin clean Dry evince of psoriasis over lower ext 5. Heart: Regular rate and rhythm no Murmur, Rub or gallop 6. Lungs: Clear to auscultation bilaterally, no wheezes or crackles   7. Abdomen: Soft, suprapubic tenderness, Non distended, bowel sounds present. Rebound tenderness noted 8. Lower extremities: no clubbing, cyanosis, or edema 9. Neurologically Grossly intact, moving all 4 extremities equally 10. MSK: Normal range of motion  body mass index is 37.34 kg/(m^2).   Labs on Admission:   Recent Labs  04/25/13 0155  NA  135  K 4.7  CL 98  CO2 26  GLUCOSE 152*  BUN 17  CREATININE 1.10  CALCIUM 10.3    Recent Labs  04/25/13 0155  AST 42*  ALT 58*  ALKPHOS 118*  BILITOT 1.3*  PROT 9.5*  ALBUMIN 4.0   No results found for this basename: LIPASE, AMYLASE,  in the last 72 hours  Recent Labs  04/25/13 0155  WBC 15.4*  NEUTROABS 11.8*  HGB 16.3  HCT 46.8  MCV 89.5  PLT 206   No results found for this basename: CKTOTAL, CKMB, CKMBINDEX, TROPONINI,  in the last 72 hours No results found for this basename: TSH, T4TOTAL, FREET3,  T3FREE, THYROIDAB,  in the last 72 hours No results found for this basename: VITAMINB12, FOLATE, FERRITIN, TIBC, IRON, RETICCTPCT,  in the last 72 hours No results found for this basename: HGBA1C    Estimated Creatinine Clearance: 100.6 ml/min (by C-G formula based on Cr of 1.1). ABG No results found for this basename: phart, pco2, po2, hco3, tco2, acidbasedef, o2sat     No results found for this basename: DDIMER     Other results:   UA no evidence of infection  Cultures: No results found for this basename: sdes, specrequest, cult, reptstatus       Radiological Exams on Admission: Ct Abdomen Pelvis W Contrast  04/25/2013   CLINICAL DATA:  Abdominal pain, a ventriculoperitoneal shunts, fever.  EXAM: CT ABDOMEN AND PELVIS WITH CONTRAST  TECHNIQUE: Multidetector CT imaging of the abdomen and pelvis was performed using the standard protocol following bolus administration of intravenous contrast.  CONTRAST:  27m OMNIPAQUE IOHEXOL 300 MG/ML SOLN, 1062mOMNIPAQUE IOHEXOL 300 MG/ML SOLN  COMPARISON:  CT of the abdomen and pelvis June 10, 2012  FINDINGS: Limited view of the lung bases are clear. Included heart and pericardium are unremarkable.  Sigmoid diverticulosis with superimposed inflammatory changes, no bowel perforation, abscess or free fluid in the pelvis. Adjacent to this sigmoid colon is a mildly enhancing fatty the 2.7 x 1.2 cm present epiploic  appendagitis, axial 73/96. Ventriculoperitoneal shunt in place, terminating in the pelvis. The stomach, small bowel are unremarkable, contrast has yet to reach the large bowel. Normal appendix.  Fatty liver, with nodular contour concerning for cirrhosis. The spleen, pancreas, adrenal glands and gallbladder are unremarkable.  Normal appearance of the kidneys. Great vessels are normal in course and caliber. Urinary bladder is partially distended, unremarkable ; evaluation the pelvis is limited by streak artifact from left hip arthroplasty. Deformity of the left iliac bone may reflect remote injury. Degenerative change of the lumbar spine.  IMPRESSION: Acute sigmoid diverticulitis, without bowel perforation or abscess. Suspected adjacent epiploic appendagitis.  Nodule liver concerning for cirrhosis.   Electronically Signed   By: CoElon Alas On: 04/25/2013 03:18    Chart has been reviewed  Assessment/Plan  [age]o M w hx of DM here with evidence of  epiploic appendagitis/ diverticulitis   Present on Admission:  . Sigmoid diverticulitis epiploic appendagitis/ diverticulitis - Spoke to Dr. BlNinfa Lindenurgery on call. At this point no indication for operative intervention. Pain management, will change to Zosyn, NPO except sips with meds for now and observe . Hypertension - continue home meds . GERD (gastroesophageal reflux disease)  - protonix . Type II or unspecified type diabetes mellitus without mention of complication, not stated as uncontrolled - SSI hold metformin Cirrhosis of the liver according to CT, denies any EtOH use, will obtain hepatitis serologies, would need to review drugs for hepato toxicity   Prophylaxis: SCD Protonix  CODE STATUS FULL CODE  Other plan as per orders.  I have spent a total of 55 min on this admission  Braxtyn Dorff 04/25/2013, 5:43 AM

## 2013-04-25 NOTE — Progress Notes (Signed)
[facility] PROGRESS NOTE  James [name] SKA:[id number] DOB: March 13, 1956 DOA: 04/25/2013 PCP: Manfred Shirts  Assessment/Plan: Sepsis; patient meets criteria for SIRS and has a source of infection; therefore meets the criteria for sepsis -Patient will require Zosyn+ vancomycin (pharmacy to handle). Counseled patient and family may need extended period of coverage. -Obtain blood cultures x2, urine culture times one, and Procalcitonin, lactic acid -Obtain daily CMP, CBC with differential. -Bolus 3 L normal saline, then restart normal saline at 145m/hr  Sigmoid diverticulitis epiploic appendagitis/ diverticulitis -admission H. and P. Phone consult with Dr. BNinfa Lindensurgery on call was obtained by admitting physician. No indication for operative intervention.  -Pain management, continue pain meds unchanged except for D/C Norco/Vicodin secondary to elevated liver enzymes. Increase Dilaudid. Continue Cymbalta, Neurontin  Hypertension - continue home meds  GERD (gastroesophageal reflux disease) - protonix   Type II or unspecified type diabetes mellitus without mention of complication, not stated as uncontrolled - continue SSI,  hold metformin   Liver cirrhosis; according to CT, denies any EtOH use, will obtain hepatitis serologies,  -Patient on Humira which can be hepatotoxic, given that his enzymes only slightly elevated would not stop at this point. NOTE last dose given 10/20 and patient receives every 14 days    Psoriatic arthritis; patient on Humira for this as well as his eczema last dose 10/20      Code Status: Full Family Communication: Family present for discussion of care Disposition Plan:   Consultants:    Procedures:  CT abdomen and pelvis with contrast 04/17/2013 Sigmoid diverticulosis with superimposed inflammatory changes, no  bowel perforation, abscess or free fluid in the pelvis. Adjacent to  this sigmoid colon is a mildly enhancing fatty the 2.7 x 1.2 cm   present epiploic appendagitis, axial 73/96. Ventriculoperitoneal  shunt in place, terminating in the pelvis. The stomach, small bowel  are unremarkable, contrast has yet to reach the large bowel. Normal  appendix.  Fatty liver, with nodular contour concerning for cirrhosis. The  spleen, pancreas, adrenal glands and gallbladder are unremarkable.  Normal appearance of the kidneys. Great vessels are normal in course  and caliber. Urinary bladder is partially distended, unremarkable ;  evaluation the pelvis is limited by streak artifact from left hip  arthroplasty.    Antibiotics:  Zosyn 10/28>>>  Vancomycin 10/28>>    HPI/Subjective: KFINDLEY VIis a 57y.o. WM PMHx  Hypertension; Diabetes mellitus without complication; Stomach ulcer; Acid reflux; Neuropathy of both feet; Kidney stone; Post-traumatic hydrocephalus S/P Ventriculoperitoneal shunt x2 (last placed 2007), Psoriasis; and Eczema, psoriatic arthritis on immunosuppressive therapy. Present[facility] with severe supra pubic pain [facility] He was having low grade fever and some chills. Denies nausea vomiting or diarrhea. CT scan showed Acute sigmoid diverticulitis, without bowel perforation or abscess. Suspected adjacent epiploic appendagitis. [facility] He was started on Imepenem in ED.  Currently (+) Abd pain controlled w/ pain meds, (+)Diaphoresis, (-) N/V/D     Objective: Filed Vitals:   04/25/13 0307 04/25/13 0515 04/25/13 1031 04/25/13 1342  BP: 130/80 136/79 132/76 134/72  Pulse: 104 94 96 95  Temp: 100.3 F (37.9 C) 100.2 F (37.9 C) 99.8 F (37.7 C) 99.8 F (37.7 C)  TempSrc: Oral Oral Oral Oral  Resp: 20 20 20 18   Height:  5' 11"  (1.803 m)    Weight:  121.4 kg (267 lb 10.2 oz)    SpO2: 96% 97% 98% 99%  Intake/Output Summary (Last 24 hours) at 04/25/13 1711 Last data filed at 04/25/13 1400  Gross per 24 hour  Intake   1000 ml  Output      0 ml   Net   1000 ml   Filed Weights   04/24/13 2302 04/25/13 0515  Weight: 122.471 kg (270 lb) 121.4 kg (267 lb 10.2 oz)    Exam:   General: A./O. X4, A. Mild distress (diaphoretic)  Cardiovascular: Regular rate, negative murmurs rubs or gallops, DP/PT pulses 1+ bilateral   Respiratory:  clear to auscultation bilateral   Abdomen: Obese, tender palpation left lower quadrant/suprapubic/right lower quadrant, plus bowel sounds  Musculoskeletal: Negative pedal edema, bilateral sausage fingers on hands consistent with psoriatic arthritis    Data Reviewed: Basic Metabolic Panel:  Recent Labs Lab 04/25/13 0155  NA 135  K 4.7  CL 98  CO2 26  GLUCOSE 152*  BUN 17  CREATININE 1.10  CALCIUM 10.3   Liver Function Tests:  Recent Labs Lab 04/25/13 0155  AST 42*  ALT 58*  ALKPHOS 118*  BILITOT 1.3*  PROT 9.5*  ALBUMIN 4.0   No results found for this basename: LIPASE, AMYLASE,  in the last 168 hours No results found for this basename: AMMONIA,  in the last 168 hours CBC:  Recent Labs Lab 04/25/13 0155  WBC 15.4*  NEUTROABS 11.8*  HGB 16.3  HCT 46.8  MCV 89.5  PLT 206   Cardiac Enzymes: No results found for this basename: CKTOTAL, CKMB, CKMBINDEX, TROPONINI,  in the last 168 hours BNP (last 3 results) No results found for this basename: PROBNP,  in the last 8760 hours CBG:  Recent Labs Lab 04/25/13 0743 04/25/13 1155 04/25/13 1559  GLUCAP 146* 179* 152*    No results found for this or any previous visit (from the past 240 hour(s)).   Studies: Ct Abdomen Pelvis W Contrast  04/25/2013   CLINICAL DATA:  Abdominal pain, a ventriculoperitoneal shunts, fever.  EXAM: CT ABDOMEN AND PELVIS WITH CONTRAST  TECHNIQUE: Multidetector CT imaging of the abdomen and pelvis was performed using the standard protocol following bolus administration of intravenous contrast.  CONTRAST:  37m OMNIPAQUE IOHEXOL 300 MG/ML SOLN, 102mOMNIPAQUE IOHEXOL 300 MG/ML SOLN  COMPARISON:  CT  of the abdomen and pelvis June 10, 2012  FINDINGS: Limited view of the lung bases are clear. Included heart and pericardium are unremarkable.  Sigmoid diverticulosis with superimposed inflammatory changes, no bowel perforation, abscess or free fluid in the pelvis. Adjacent to this sigmoid colon is a mildly enhancing fatty the 2.7 x 1.2 cm present epiploic appendagitis, axial 73/96. Ventriculoperitoneal shunt in place, terminating in the pelvis. The stomach, small bowel are unremarkable, contrast has yet to reach the large bowel. Normal appendix.  Fatty liver, with nodular contour concerning for cirrhosis. The spleen, pancreas, adrenal glands and gallbladder are unremarkable.  Normal appearance of the kidneys. Great vessels are normal in course and caliber. Urinary bladder is partially distended, unremarkable ; evaluation the pelvis is limited by streak artifact from left hip arthroplasty. Deformity of the left iliac bone may reflect remote injury. Degenerative change of the lumbar spine.  IMPRESSION: Acute sigmoid diverticulitis, without bowel perforation or abscess. Suspected adjacent epiploic appendagitis.  Nodule liver concerning for cirrhosis.   Electronically Signed   By: CoElon Alas On: 04/25/2013 03:18    Scheduled Meds: . carvedilol  6.25 mg Oral BID WC  . docusate sodium  100 mg Oral BID  . DULoxetine  60 mg Oral Daily  . gabapentin  100 mg Oral TID  . insulin aspart  0-9 Units Subcutaneous Q4H  . losartan  100 mg Oral Daily  . ondansetron (ZOFRAN) IV  4 mg Intravenous Once  . pantoprazole (PROTONIX) IV  40 mg Intravenous QHS  . piperacillin-tazobactam  3.375 g Intravenous Once  . piperacillin-tazobactam (ZOSYN)  IV  3.375 g Intravenous Q8H   Continuous Infusions: . sodium chloride 125 mL/hr at 04/25/13 1254    Active Problems:   GERD (gastroesophageal reflux disease)   Hypertension   Sigmoid diverticulitis   Type II or unspecified type diabetes mellitus without mention of  complication, not stated as uncontrolled   Cirrhosis    Time spent: 60 minutes   Deakon Frix, J  [facility] Pager [phone]. If 7PM-7AM, please contact night-coverage at [email or web address], password [facility] 04/25/2013, 5:11 PM  LOS: 0 days

## 2013-04-25 NOTE — Progress Notes (Signed)
ANTIBIOTIC CONSULT NOTE - INITIAL  Pharmacy Consult for Vancomycin Indication: Sepsis, Diverticulitis  Allergies  Allergen Reactions  . Morphine And Related Other (See Comments)    hallucinations  . Sulfa Drugs Cross Reactors Rash    Patient Measurements: Height: 5' 11"  (180.3 cm) Weight: 267 lb 10.2 oz (121.4 kg) IBW/kg (Calculated) : 75.3 Adjusted Body Weight:   Vital Signs: Temp: 99.4 F (37.4 C) (10/28 1856) Temp src: Oral (10/28 1856) BP: 130/76 mmHg (10/28 1856) Pulse Rate: 96 (10/28 1856) Intake/Output from previous day:   Intake/Output from this shift:    Labs:  Recent Labs  04/25/13 0155  WBC 15.4*  HGB 16.3  PLT 206  CREATININE 1.10   Estimated Creatinine Clearance: 100.6 ml/min (by C-G formula based on Cr of 1.1). No results found for this basename: VANCOTROUGH, VANCOPEAK, VANCORANDOM, GENTTROUGH, GENTPEAK, GENTRANDOM, TOBRATROUGH, TOBRAPEAK, TOBRARND, AMIKACINPEAK, AMIKACINTROU, AMIKACIN,  in the last 72 hours   Microbiology: No results found for this or any previous visit (from the past 720 hour(s)).  Medical History: Past Medical History  Diagnosis Date  . Hypertension   . Diabetes mellitus without complication     borderline per primary MD  . Stomach ulcer   . Acid reflux   . Neuropathy of both feet   . Kidney stone   . Post-traumatic hydrocephalus   . Psoriasis   . Eczema     Medications:  Scheduled:  . carvedilol  6.25 mg Oral BID WC  . docusate sodium  100 mg Oral BID  . DULoxetine  60 mg Oral Daily  . gabapentin  100 mg Oral TID  . insulin aspart  0-9 Units Subcutaneous Q4H  . losartan  100 mg Oral Daily  . ondansetron (ZOFRAN) IV  4 mg Intravenous Once  . pantoprazole (PROTONIX) IV  40 mg Intravenous QHS  . piperacillin-tazobactam  3.375 g Intravenous Once  . piperacillin-tazobactam (ZOSYN)  IV  3.375 g Intravenous Q8H  . sodium chloride  3,000 mL Intravenous Once  . vancomycin  1,000 mg Intravenous Q8H   Assessment: 57 yr  old male presents with severe supra pubic pain. He was seen at [facility] and transferred to [facility]. Pt has a ventricular shunt due to traumatic hydrocephalus. He also has DM, diverticulitis, HTN, Gerd. He has now gone into sepsis. He is getting vancomycin and Zosyn per pharmacy dosing.   Goal of Therapy:  Vancomycin trough level 15-20 mcg/ml  Plan:  1) Vancomycin 1 gm IV q8h. 2) Vanc trough levels when appropriate.  Minta Balsam 04/25/2013,7:21 PM

## 2013-04-25 NOTE — ED Provider Notes (Addendum)
CSN: [id number]     Arrival date & time 04/24/13  2247 History   None    This chart was scribed for James Fines, MD by Forrestine Him, ED Scribe. This patient was seen in room MH10/MH10 and the patient's care was started 12:59 AM.   Chief Complaint  Patient presents with  . Abdominal Pain   HPI HPI Comments: James [name] is a 57 y.o. male with a history of traumatic hydrocephalus requiring VP shunt placement. He presents to the Emergency Department complaining of gradually worsening, though somewhat intermittent, suprapubic abdominal pain that started around 5 PM today. Pt states the pain is hard to describe, but is sharp at times. Pt also reports associated fever, and nausea when the pain is at its worst. Pt denies vomiting, constipation, diarrhea, headache or neck stiffness.  PCP- [facility] here in [employer] Past Medical History  Diagnosis Date  . Hypertension   . Diabetes mellitus without complication     borderline per primary MD  . Stomach ulcer   . Acid reflux   . Neuropathy of both feet   . Kidney stone   . Post-traumatic hydrocephalus   . Psoriasis   . Eczema    Past Surgical History  Procedure Laterality Date  . Total hip arthroplasty    . Shoulder surgery Left   . Back surgery    . Ventriculoperitoneal shunt     No family history on file. History  Substance Use Topics  . Smoking status: Never Smoker   . Smokeless tobacco: Never Used     Comment: Tried when younger.    . Alcohol Use: No    Review of Systems  All other systems reviewed and are negative.    Allergies  Morphine and related and Sulfa drugs cross reactors  Home Medications   Current Outpatient Rx  Name  Route  Sig  Dispense  Refill  . adalimumab (HUMIRA) 40 MG/0.8ML injection   Subcutaneous   Inject 0.8 mLs (40 mg total) into the skin every 14 (fourteen) days.         Marland Kitchen alfuzosin (UROXATRAL) 10 MG 24 hr tablet   Oral   Take 1 tablet (10 mg total) by mouth daily. Take 1 tablet  (50m total) by mouth daily with food.         . Armodafinil (NUVIGIL) 250 MG tablet   Oral   Take 250 mg by mouth daily.         . carvedilol (COREG) 6.25 MG tablet   Oral   Take 1 tablet (6.25 mg total) by mouth 2 (two) times daily.         . DULoxetine (CYMBALTA) 60 MG capsule   Oral   Take 1 capsule (60 mg total) by mouth daily. Take 2 capsule by mouth once daily         . gabapentin (NEURONTIN) 100 MG capsule   Oral   Take 100 mg by mouth 3 (three) times daily.         .Marland Kitchenlosartan (COZAAR) 100 MG tablet   Oral   Take 1 tablet (100 mg total) by mouth daily.         . metFORMIN (GLUCOPHAGE) 500 MG tablet   Oral   Take 500 mg by mouth daily.         . Naproxen Sodium (ALEVE) 220 MG CAPS   Oral   Take 220 mg by mouth at bedtime as needed.         .Marland Kitchen  omeprazole (PRILOSEC) 40 MG capsule   Oral   Take 1 capsule (40 mg total) by mouth 2 (two) times daily.         . Testosterone 30 MG/ACT SOLN   Transdermal   Place 30 mg onto the skin daily.         Marland Kitchen testosterone cypionate (DEPOTESTOTERONE CYPIONATE) 200 MG/ML injection   Intramuscular   Inject into the muscle every 14 (fourteen) days.          BP 156/85  Pulse 96  Temp(Src) 101 F (38.3 C) (Oral)  Resp 16  Ht 5' 11"  (1.803 m)  Wt 270 lb (122.471 kg)  BMI 37.67 kg/m2  SpO2 97%  Physical Exam  General: Well-developed, well-nourished male in no acute distress; appearance consistent with age of record HENT: normocephalic; atraumatic. No meningeal signs. VP shunt palpable in scalp superior and posterior to left ear. Not tender to palpation. Eyes: pupils equal, round and reactive to light; extraocular muscles intact Neck: supple Heart: regular rate and rhythm; no murmurs, rubs or gallops Lungs: clear to auscultation bilaterally Abdomen: soft; nondistended; no masses or hepatosplenomegaly; bowel sounds present; suprapubic tenderness. Extremities: No deformity; full range of motion; pulses  normal; no edema Neurologic: Awake, alert and oriented; motor function intact in all extremities and symmetric; no facial droop Skin: Warm and dry. Scattered scaly plaques consistent with psoriasis  Psychiatric: Normal mood and affect   ED Course  Procedures (including critical care time)  DIAGNOSTIC STUDIES: Oxygen Saturation is 97% on RA, Normal by my interpretation.    COORDINATION OF CARE: 12:59 AM-Discussed treatment plan with pt at bedside and pt agreed to plan.    MDM   Nursing notes and vitals signs, including pulse oximetry, reviewed.  Summary of this visit's results, reviewed by myself:  Labs:  Results for orders placed during the hospital encounter of 04/25/13 (from the past 24 hour(s))  URINALYSIS, ROUTINE W REFLEX MICROSCOPIC     Status: None   Collection Time    04/24/13 11:10 PM      Result Value Range   Color, Urine YELLOW  YELLOW   APPearance CLEAR  CLEAR   Specific Gravity, Urine 1.019  1.005 - 1.030   pH 5.5  5.0 - 8.0   Glucose, UA NEGATIVE  NEGATIVE mg/dL   Hgb urine dipstick NEGATIVE  NEGATIVE   Bilirubin Urine NEGATIVE  NEGATIVE   Ketones, ur NEGATIVE  NEGATIVE mg/dL   Protein, ur NEGATIVE  NEGATIVE mg/dL   Urobilinogen, UA 1.0  0.0 - 1.0 mg/dL   Nitrite NEGATIVE  NEGATIVE   Leukocytes, UA NEGATIVE  NEGATIVE  CBC WITH DIFFERENTIAL     Status: Abnormal   Collection Time    04/25/13  1:55 AM      Result Value Range   WBC 15.4 (*) 4.0 - 10.5 K/uL   RBC 5.23  4.22 - 5.81 MIL/uL   Hemoglobin 16.3  13.0 - 17.0 g/dL   HCT 46.8  39.0 - 52.0 %   MCV 89.5  78.0 - 100.0 fL   MCH 31.2  26.0 - 34.0 pg   MCHC 34.8  30.0 - 36.0 g/dL   RDW 14.0  11.5 - 15.5 %   Platelets 206  150 - 400 K/uL   Neutrophils Relative % 77  43 - 77 %   Neutro Abs 11.8 (*) 1.7 - 7.7 K/uL   Lymphocytes Relative 13  12 - 46 %   Lymphs Abs 2.1  0.7 - 4.0 K/uL  Monocytes Relative 9  3 - 12 %   Monocytes Absolute 1.4 (*) 0.1 - 1.0 K/uL   Eosinophils Relative 1  0 - 5 %    Eosinophils Absolute 0.1  0.0 - 0.7 K/uL   Basophils Relative 0  0 - 1 %   Basophils Absolute 0.0  0.0 - 0.1 K/uL  COMPREHENSIVE METABOLIC PANEL     Status: Abnormal   Collection Time    04/25/13  1:55 AM      Result Value Range   Sodium 135  135 - 145 mEq/L   Potassium 4.7  3.5 - 5.1 mEq/L   Chloride 98  96 - 112 mEq/L   CO2 26  19 - 32 mEq/L   Glucose, Bld 152 (*) 70 - 99 mg/dL   BUN 17  6 - 23 mg/dL   Creatinine, Ser 1.10  0.50 - 1.35 mg/dL   Calcium 10.3  8.4 - 10.5 mg/dL   Total Protein 9.5 (*) 6.0 - 8.3 g/dL   Albumin 4.0  3.5 - 5.2 g/dL   AST 42 (*) 0 - 37 U/L   ALT 58 (*) 0 - 53 U/L   Alkaline Phosphatase 118 (*) 39 - 117 U/L   Total Bilirubin 1.3 (*) 0.3 - 1.2 mg/dL   GFR calc non Af Amer 74 (*) >90 mL/min   GFR calc Af Amer 86 (*) >90 mL/min    Imaging Studies: Ct Abdomen Pelvis W Contrast  04/25/2013   CLINICAL DATA:  Abdominal pain, a ventriculoperitoneal shunts, fever.  EXAM: CT ABDOMEN AND PELVIS WITH CONTRAST  TECHNIQUE: Multidetector CT imaging of the abdomen and pelvis was performed using the standard protocol following bolus administration of intravenous contrast.  CONTRAST:  49m OMNIPAQUE IOHEXOL 300 MG/ML SOLN, 1038mOMNIPAQUE IOHEXOL 300 MG/ML SOLN  COMPARISON:  CT of the abdomen and pelvis June 10, 2012  FINDINGS: Limited view of the lung bases are clear. Included heart and pericardium are unremarkable.  Sigmoid diverticulosis with superimposed inflammatory changes, no bowel perforation, abscess or free fluid in the pelvis. Adjacent to this sigmoid colon is a mildly enhancing fatty the 2.7 x 1.2 cm present epiploic appendagitis, axial 73/96. Ventriculoperitoneal shunt in place, terminating in the pelvis. The stomach, small bowel are unremarkable, contrast has yet to reach the large bowel. Normal appendix.  Fatty liver, with nodular contour concerning for cirrhosis. The spleen, pancreas, adrenal glands and gallbladder are unremarkable.  Normal appearance of the  kidneys. Great vessels are normal in course and caliber. Urinary bladder is partially distended, unremarkable ; evaluation the pelvis is limited by streak artifact from left hip arthroplasty. Deformity of the left iliac bone may reflect remote injury. Degenerative change of the lumbar spine.  IMPRESSION: Acute sigmoid diverticulitis, without bowel perforation or abscess. Suspected adjacent epiploic appendagitis.  Nodule liver concerning for cirrhosis.   Electronically Signed   By: CoElon Alas On: 04/25/2013 03:18   3:26 AM Primaxin 50061mtarted for diverticulitis. No neurologic symptoms (headache, stiff neck, altered LOC) at this time.   I personally performed the services described in this documentation, which was scribed in my presence.  The recorded information has been reviewed and is accurate.  JohWynetta FinesD 04/25/13 0327  JohWynetta FinesD 04/25/13 [phone]

## 2013-04-25 NOTE — ED Notes (Signed)
Pt. Has a brown to bloody nasal drainage he is spitting out.

## 2013-04-26 DIAGNOSIS — G4700 Insomnia, unspecified: Secondary | ICD-10-CM

## 2013-04-26 DIAGNOSIS — K7460 Unspecified cirrhosis of liver: Secondary | ICD-10-CM

## 2013-04-26 DIAGNOSIS — L4050 Arthropathic psoriasis, unspecified: Secondary | ICD-10-CM

## 2013-04-26 HISTORY — DX: Insomnia, unspecified: G47.00

## 2013-04-26 LAB — GLUCOSE, CAPILLARY
Glucose-Capillary: 117 mg/dL — ABNORMAL HIGH (ref 70–99)
Glucose-Capillary: 122 mg/dL — ABNORMAL HIGH (ref 70–99)
Glucose-Capillary: 130 mg/dL — ABNORMAL HIGH (ref 70–99)
Glucose-Capillary: 139 mg/dL — ABNORMAL HIGH (ref 70–99)
Glucose-Capillary: 76 mg/dL (ref 70–99)
Glucose-Capillary: 86 mg/dL (ref 70–99)

## 2013-04-26 LAB — COMPREHENSIVE METABOLIC PANEL
AST: 23 U/L (ref 0–37)
Albumin: 2.8 g/dL — ABNORMAL LOW (ref 3.5–5.2)
Alkaline Phosphatase: 88 U/L (ref 39–117)
CO2: 22 mEq/L (ref 19–32)
Calcium: 7.7 mg/dL — ABNORMAL LOW (ref 8.4–10.5)
Creatinine, Ser: 1.15 mg/dL (ref 0.50–1.35)
GFR calc non Af Amer: 70 mL/min — ABNORMAL LOW (ref >90)
Potassium: 4.2 mEq/L (ref 3.5–5.1)
Total Protein: 7 g/dL (ref 6.0–8.3)

## 2013-04-26 LAB — CBC WITH DIFFERENTIAL/PLATELET
Basophils Absolute: 0 10*3/uL (ref 0.0–0.1)
Eosinophils Absolute: 0.2 10*3/uL (ref 0.0–0.7)
Hemoglobin: 12.6 g/dL — ABNORMAL LOW (ref 13.0–17.0)
Lymphs Abs: 2.6 10*3/uL (ref 0.7–4.0)
MCH: 31 pg (ref 26.0–34.0)
MCHC: 34.6 g/dL (ref 30.0–36.0)
Monocytes Relative: 9 % (ref 3–12)
Neutrophils Relative %: 68 % (ref 43–77)
RBC: 4.06 MIL/uL — ABNORMAL LOW (ref 4.22–5.81)

## 2013-04-26 LAB — MAGNESIUM: Magnesium: 1.7 mg/dL (ref 1.5–2.5)

## 2013-04-26 LAB — PHOSPHORUS: Phosphorus: 2.2 mg/dL — ABNORMAL LOW (ref 2.3–4.6)

## 2013-04-26 LAB — TSH: TSH: 1.939 u[IU]/mL (ref 0.350–4.500)

## 2013-04-26 MED ORDER — INSULIN ASPART 100 UNIT/ML ~~LOC~~ SOLN: 0.0000 [IU] | Freq: Three times a day (TID) | SUBCUTANEOUS | Status: DC

## 2013-04-26 MED ORDER — PANTOPRAZOLE SODIUM 40 MG PO TBEC
40.0000 mg | DELAYED_RELEASE_TABLET | Freq: Every day | ORAL | Status: DC
  Administered 2013-04-27 – 2013-04-28 (×2): 40 mg via ORAL
  Filled 2013-04-26 (×2): qty 1

## 2013-04-26 MED ORDER — ZOLPIDEM TARTRATE 5 MG PO TABS
5.0000 mg | ORAL_TABLET | Freq: Every evening | ORAL | Status: DC | PRN
  Administered 2013-04-26 – 2013-04-27 (×2): 5 mg via ORAL
  Filled 2013-04-26 (×2): qty 1

## 2013-04-26 NOTE — Progress Notes (Signed)
Chart reviewed.  [facility] PROGRESS NOTE  James [name] QTM:[id number] DOB: Jan 11, 1956 DOA: 04/25/2013 PCP: Manfred Shirts  Assessment/Plan: No evidence of ongoing sepsis. Vitals all normal.  WBC improving.    Sigmoid diverticulitis epiploic appendagitis/ diverticulitis -admission H. and P. Phone consult with Dr. Ninfa Linden surgery on call was obtained by admitting physician. No indication for operative intervention.  Start clears. Decrease IVF, continue zosyn. Colonoscopy as outpt. Never had one.  Hypertension - stable  GERD (gastroesophageal reflux disease) - protonix   Type II or unspecified type diabetes mellitus without mention of complication, not stated as uncontrolled - continue SSI,  hold metformin   Liver cirrhosis; hepatitis panel negative, and denies h/o heavy alcohol.  Likely nonalcoholic fatty liver disease  Psoriatic arthritis; patient on Humira for this as well as his eczema last dose 10/20  H/o VP shunt. No reported neurologic sequelae  Code Status: Full Family Communication: Family present for discussion of care Disposition Plan:   Consultants:    Procedures:  CT abdomen and pelvis with contrast 04/17/2013 Sigmoid diverticulosis with superimposed inflammatory changes, no  bowel perforation, abscess or free fluid in the pelvis. Adjacent to  this sigmoid colon is a mildly enhancing fatty the 2.7 x 1.2 cm  present epiploic appendagitis, axial 73/96. Ventriculoperitoneal  shunt in place, terminating in the pelvis. The stomach, small bowel  are unremarkable, contrast has yet to reach the large bowel. Normal  appendix.  Fatty liver, with nodular contour concerning for cirrhosis. The  spleen, pancreas, adrenal glands and gallbladder are unremarkable.  Normal appearance of the kidneys. Great vessels are normal in course  and caliber. Urinary bladder is partially distended, unremarkable ;  evaluation the pelvis is limited by streak artifact from left hip   arthroplasty.    Antibiotics:  Zosyn 10/28>>>  Vancomycin 10/28>>    HPI/Subjective: Still with pain.  No nausea.  No h/o diverticulitis.  Never had colonoscopy  Objective: Filed Vitals:   04/25/13 2045 04/26/13 0221 04/26/13 0525 04/26/13 1013  BP: 98/64 103/64 103/56 110/68  Pulse: 75 76 83 82  Temp: 98.2 F (36.8 C) 98 F (36.7 C) 97.9 F (36.6 C) 98 F (36.7 C)  TempSrc: Oral   Oral  Resp: 18 18 18 18   Height:      Weight:      SpO2: 95% 95% 95% 98%    Intake/Output Summary (Last 24 hours) at 04/26/13 1302 Last data filed at 04/26/13 1109  Gross per 24 hour  Intake   1190 ml  Output      0 ml  Net   1190 ml   Filed Weights   04/24/13 2302 04/25/13 0515  Weight: 122.471 kg (270 lb) 121.4 kg (267 lb 10.2 oz)    Exam:   General: comfortable. Watching TV. nontoxic  Cardiovascular: Regular rate, (diaphoretic)negative murmurs rubs or gallops, DP/PT pulses 1+ bilateral   Respiratory:  clear to auscultation bilateral   Abdomen: Obese, tender palpation lower abdomen. BS present  Musculoskeletal: no edema  Skin: silvery plaques on extensor surfaces  Data Reviewed: Basic Metabolic Panel:  Recent Labs Lab 04/25/13 0155 04/26/13 0510  NA 135 136  K 4.7 4.2  CL 98 104  CO2 26 22  GLUCOSE 152* 130*  BUN 17 14  CREATININE 1.10 1.15  CALCIUM 10.3 7.7*  MG  --  1.7  PHOS  --  2.2*   Liver Function Tests:  Recent Labs Lab 04/25/13 0155 04/26/13 0510  AST 42* 23  ALT  58* 32  ALKPHOS 118* 88  BILITOT 1.3* 1.9*  PROT 9.5* 7.0  ALBUMIN 4.0 2.8*   No results found for this basename: LIPASE, AMYLASE,  in the last 168 hours No results found for this basename: AMMONIA,  in the last 168 hours CBC:  Recent Labs Lab 04/25/13 0155 04/26/13 0510  WBC 15.4* 12.3*  NEUTROABS 11.8* 8.4*  HGB 16.3 12.6*  HCT 46.8 36.4*  MCV 89.5 89.7  PLT 206 144*   Cardiac Enzymes: No results found for this basename: CKTOTAL, CKMB, CKMBINDEX, TROPONINI,  in  the last 168 hours BNP (last 3 results) No results found for this basename: PROBNP,  in the last 8760 hours CBG:  Recent Labs Lab 04/25/13 1951 04/25/13 2357 04/26/13 0419 04/26/13 0754 04/26/13 1153  GLUCAP 125* 117* 130* 122* 139*    No results found for this or any previous visit (from the past 240 hour(s)).   Studies: Ct Abdomen Pelvis W Contrast  04/25/2013   CLINICAL DATA:  Abdominal pain, a ventriculoperitoneal shunts, fever.  EXAM: CT ABDOMEN AND PELVIS WITH CONTRAST  TECHNIQUE: Multidetector CT imaging of the abdomen and pelvis was performed using the standard protocol following bolus administration of intravenous contrast.  CONTRAST:  79m OMNIPAQUE IOHEXOL 300 MG/ML SOLN, 1032mOMNIPAQUE IOHEXOL 300 MG/ML SOLN  COMPARISON:  CT of the abdomen and pelvis June 10, 2012  FINDINGS: Limited view of the lung bases are clear. Included heart and pericardium are unremarkable.  Sigmoid diverticulosis with superimposed inflammatory changes, no bowel perforation, abscess or free fluid in the pelvis. Adjacent to this sigmoid colon is a mildly enhancing fatty the 2.7 x 1.2 cm present epiploic appendagitis, axial 73/96. Ventriculoperitoneal shunt in place, terminating in the pelvis. The stomach, small bowel are unremarkable, contrast has yet to reach the large bowel. Normal appendix.  Fatty liver, with nodular contour concerning for cirrhosis. The spleen, pancreas, adrenal glands and gallbladder are unremarkable.  Normal appearance of the kidneys. Great vessels are normal in course and caliber. Urinary bladder is partially distended, unremarkable ; evaluation the pelvis is limited by streak artifact from left hip arthroplasty. Deformity of the left iliac bone may reflect remote injury. Degenerative change of the lumbar spine.  IMPRESSION: Acute sigmoid diverticulitis, without bowel perforation or abscess. Suspected adjacent epiploic appendagitis.  Nodule liver concerning for cirrhosis.    Electronically Signed   By: CoElon Alas On: 04/25/2013 03:18    Scheduled Meds: . carvedilol  6.25 mg Oral BID WC  . docusate sodium  100 mg Oral BID  . DULoxetine  60 mg Oral Daily  . gabapentin  100 mg Oral TID  . insulin aspart  0-9 Units Subcutaneous Q4H  . losartan  100 mg Oral Daily  . ondansetron (ZOFRAN) IV  4 mg Intravenous Once  . pantoprazole (PROTONIX) IV  40 mg Intravenous QHS  . piperacillin-tazobactam  3.375 g Intravenous Once  . piperacillin-tazobactam (ZOSYN)  IV  3.375 g Intravenous Q8H   Continuous Infusions: . sodium chloride 125 mL/hr at 04/25/13 1254  . sodium chloride 150 mL/hr at 04/26/13 1116   Time spent: 35 minutes  SUDelfina RedwoodMD  [facility] Pager [phone] 7PM-7AM, please contact night-coverage at [email or web address], password [email or web address]0/29/2014, 1:02 PM  LOS: 1 day

## 2013-04-26 NOTE — Progress Notes (Signed)
Met with James [name] at bedside to explain Link to Wellness program for DM and HTN management. States his wife is an Advertising copywriter [employer]. Reports he does not come to [facility] much so he is not sure if he will want to follow up with a Link to [facility] or not. Left Link to Wellness packet and contact information for [place] employees/dependents with [employer] insurance at bedside. James Stecher states he will have his wife look at information. Explained benefits of program. Appreciative of visit and wished patient well as he reports he is not feeling well at this time.  Marthenia Rolling, MSN-Ed, RN,BSN- [facility] [phone]

## 2013-04-27 LAB — URINE CULTURE
Colony Count: NO GROWTH
Culture: NO GROWTH

## 2013-04-27 LAB — GLUCOSE, CAPILLARY
Glucose-Capillary: 86 mg/dL (ref 70–99)
Glucose-Capillary: 96 mg/dL (ref 70–99)

## 2013-04-27 MED ORDER — METRONIDAZOLE 500 MG PO TABS
500.0000 mg | ORAL_TABLET | Freq: Three times a day (TID) | ORAL | Status: DC
  Administered 2013-04-27 – 2013-04-28 (×4): 500 mg via ORAL
  Filled 2013-04-27 (×6): qty 1

## 2013-04-27 MED ORDER — CIPROFLOXACIN HCL 500 MG PO TABS
500.0000 mg | ORAL_TABLET | Freq: Two times a day (BID) | ORAL | Status: DC
  Administered 2013-04-27 – 2013-04-28 (×3): 500 mg via ORAL
  Filled 2013-04-27 (×5): qty 1

## 2013-04-27 MED ORDER — HYDROMORPHONE HCL PF 1 MG/ML IJ SOLN
1.0000 mg | INTRAMUSCULAR | Status: DC | PRN
Start: 2013-04-27 — End: 2013-04-28

## 2013-04-27 MED ORDER — OXYCODONE HCL 5 MG PO TABS
5.0000 mg | ORAL_TABLET | ORAL | Status: DC | PRN
  Administered 2013-04-27 – 2013-04-28 (×3): 10 mg via ORAL
  Filled 2013-04-27 (×3): qty 2

## 2013-04-27 NOTE — Progress Notes (Signed)
[facility] PROGRESS NOTE  James [name] LSL:[id number] DOB: 02/10/1956 DOA: 04/25/2013 PCP: Manfred Shirts  Assessment/Plan: No evidence of ongoing sepsis.   Sigmoid diverticulitis epiploic appendagitis/ diverticulitis -tolerating clear liquids. Pain slightly improved but still requiring frequent IV pain medication.  will change to oral antibiotics. Advance to low residue diet. Home tomorrow if stable.increase activity.  Hypertension - stable  GERD (gastroesophageal reflux disease) - protonix   Type II or unspecified type diabetes mellitus without mention of complication, not stated as uncontrolled - continue SSI,  hold metformin   Liver cirrhosis; hepatitis panel negative, and denies h/o heavy alcohol.  Likely nonalcoholic fatty liver disease  Psoriatic arthritis; patient on Humira for this as well as his eczema last dose 10/20  H/o VP shunt. No reported neurologic sequelae  Code Status: Full Family Communication: Family present for discussion of care Disposition Plan:   Consultants:    Procedures:  CT abdomen and pelvis with contrast 04/17/2013 Sigmoid diverticulosis with superimposed inflammatory changes, no  bowel perforation, abscess or free fluid in the pelvis. Adjacent to  this sigmoid colon is a mildly enhancing fatty the 2.7 x 1.2 cm  present epiploic appendagitis, axial 73/96. Ventriculoperitoneal  shunt in place, terminating in the pelvis. The stomach, small bowel  are unremarkable, contrast has yet to reach the large bowel. Normal  appendix.  Fatty liver, with nodular contour concerning for cirrhosis. The  spleen, pancreas, adrenal glands and gallbladder are unremarkable.  Normal appearance of the kidneys. Great vessels are normal in course  and caliber. Urinary bladder is partially distended, unremarkable ;  evaluation the pelvis is limited by streak artifact from left hip  arthroplasty.    Antibiotics:  Zosyn 10/28>>>  Vancomycin  10/28>>    HPI/Subjective: Still requiring frequent pain medication but overall improved. No nausea vomiting. Tolerating clear liquid diet. No fevers or chills.  Objective: Filed Vitals:   04/26/13 1450 04/26/13 1700 04/26/13 2149 04/27/13 0504  BP: 105/66 101/65 107/60 123/73  Pulse: 88 70 69 79  Temp: 98.4 F (36.9 C) 98.7 F (37.1 C) 98.3 F (36.8 C) 97.6 F (36.4 C)  TempSrc: Oral Oral Oral   Resp: 18 18 19 18   Height:      Weight:      SpO2: 99% 93% 97% 94%    Intake/Output Summary (Last 24 hours) at 04/27/13 1049 Last data filed at 04/27/13 0500  Gross per 24 hour  Intake 5043.75 ml  Output      0 ml  Net 5043.75 ml   Filed Weights   04/24/13 2302 04/25/13 0515  Weight: 122.471 kg (270 lb) 121.4 kg (267 lb 10.2 oz)    Exam:   General: comfortable. Watching TV. nontoxic  Cardiovascular: Regular rate, (diaphoretic)negative murmurs rubs or gallops, DP/PT pulses 1+ bilateral   Respiratory:  clear to auscultation bilateral   Abdomen: Obese, tender palpation lower abdomen. BS present  Musculoskeletal: no edema  Skin: silvery plaques on extensor surfaces  Data Reviewed: Basic Metabolic Panel:  Recent Labs Lab 04/25/13 0155 04/26/13 0510  NA 135 136  K 4.7 4.2  CL 98 104  CO2 26 22  GLUCOSE 152* 130*  BUN 17 14  CREATININE 1.10 1.15  CALCIUM 10.3 7.7*  MG  --  1.7  PHOS  --  2.2*   Liver Function Tests:  Recent Labs Lab 04/25/13 0155 04/26/13 0510  AST 42* 23  ALT 58* 32  ALKPHOS 118* 88  BILITOT 1.3* 1.9*  PROT 9.5* 7.0  ALBUMIN 4.0 2.8*   No results found for this basename: LIPASE, AMYLASE,  in the last 168 hours No results found for this basename: AMMONIA,  in the last 168 hours CBC:  Recent Labs Lab 04/25/13 0155 04/26/13 0510  WBC 15.4* 12.3*  NEUTROABS 11.8* 8.4*  HGB 16.3 12.6*  HCT 46.8 36.4*  MCV 89.5 89.7  PLT 206 144*   Cardiac Enzymes: No results found for this basename: CKTOTAL, CKMB, CKMBINDEX, TROPONINI,   in the last 168 hours BNP (last 3 results) No results found for this basename: PROBNP,  in the last 8760 hours CBG:  Recent Labs Lab 04/26/13 0754 04/26/13 1153 04/26/13 1636 04/26/13 2205 04/27/13 0754  GLUCAP 122* 139* 86 76 92    Recent Results (from the past 240 hour(s))  URINE CULTURE     Status: None   Collection Time    04/25/13  6:43 PM      Result Value Range Status   Specimen Description URINE, CLEAN CATCH   Final   Special Requests Immunocompromised   Final   Culture  Setup Time     Final   Value: 04/26/2013 02:39     Performed at [place]     Final   Value: NO GROWTH     Performed at [facility]   Culture     Final   Value: NO GROWTH     Performed at [facility]   Report Status 04/27/2013 FINAL   Final  CULTURE, BLOOD (ROUTINE X 2)     Status: None   Collection Time    04/25/13  8:32 PM      Result Value Range Status   Specimen Description BLOOD RIGHT FOREARM   Final   Special Requests     Final   Value: BOTTLES DRAWN AEROBIC AND ANAEROBIC 10CC AER,8CC ANA   Culture  Setup Time     Final   Value: 04/26/2013 02:05     Performed at [facility]   Culture     Final   Value:        BLOOD CULTURE RECEIVED NO GROWTH TO DATE CULTURE WILL BE HELD FOR 5 DAYS BEFORE ISSUING A FINAL NEGATIVE REPORT     Performed at [facility]   Report Status PENDING   Incomplete  CULTURE, BLOOD (ROUTINE X 2)     Status: None   Collection Time    04/25/13  8:39 PM      Result Value Range Status   Specimen Description BLOOD RIGHT HAND   Final   Special Requests BOTTLES DRAWN AEROBIC AND ANAEROBIC 10CC   Final   Culture  Setup Time     Final   Value: 04/26/2013 02:04     Performed at [facility]   Culture     Final   Value:        BLOOD CULTURE RECEIVED NO GROWTH TO DATE CULTURE WILL BE HELD FOR 5 DAYS BEFORE ISSUING A FINAL NEGATIVE REPORT     Performed at [facility]   Report Status PENDING    Incomplete     Studies: No results found.  Scheduled Meds: . carvedilol  6.25 mg Oral BID WC  . docusate sodium  100 mg Oral BID  . DULoxetine  60 mg Oral Daily  . gabapentin  100 mg Oral TID  . insulin aspart  0-9 Units Subcutaneous TID WC  . losartan  100 mg Oral  Daily  . ondansetron (ZOFRAN) IV  4 mg Intravenous Once  . pantoprazole  40 mg Oral Daily  . piperacillin-tazobactam  3.375 g Intravenous Once  . piperacillin-tazobactam (ZOSYN)  IV  3.375 g Intravenous Q8H   Continuous Infusions: . sodium chloride 50 mL/hr at 04/26/13 2145   Time spent: 25 minutes  Delfina Redwood, MD  [facility] Pager [phone]. If 7PM-7AM, please contact night-coverage at [email or web address], password [facility] 04/27/2013, 10:49 AM  LOS: 2 days

## 2013-04-28 LAB — GLUCOSE, CAPILLARY: Glucose-Capillary: 116 mg/dL — ABNORMAL HIGH (ref 70–99)

## 2013-04-28 MED ORDER — METRONIDAZOLE 500 MG PO TABS: 500.0000 mg | ORAL_TABLET | Freq: Three times a day (TID) | ORAL | Status: DC

## 2013-04-28 MED ORDER — CIPROFLOXACIN HCL 500 MG PO TABS: 500.0000 mg | ORAL_TABLET | Freq: Two times a day (BID) | ORAL | Status: DC

## 2013-04-28 MED ORDER — OXYCODONE HCL 5 MG PO TABS: 5.0000 mg | ORAL_TABLET | Freq: Four times a day (QID) | ORAL | Status: DC | PRN

## 2013-04-28 MED ORDER — ACETAMINOPHEN 325 MG PO TABS: 650.0000 mg | ORAL_TABLET | Freq: Four times a day (QID) | ORAL | Status: DC | PRN

## 2013-04-28 NOTE — Progress Notes (Signed)
Discharge instructions reviewed with pt and pt's wife and prescriptions given.  Also gave information about a low fiber and high fiber diet.  Pt and pt's wife verbalized understanding and had no questions.  Pt discharged in stable condition with wife.  James [name] Capac

## 2013-04-28 NOTE — Discharge Summary (Signed)
Physician Discharge Summary  James [name] LMB:[id number] DOB: 1955/08/23 DOA: 04/25/2013  PCP: Manfred Shirts  Admit date: 04/25/2013 Discharge date: 04/28/2013  Time spent: greater than 30 minutes  Recommendations for Outpatient Follow-up:  1. Outpatient colonoscopy after resolution of diverticulitis  Discharge Diagnoses:  Sigmoid diverticulitis   Psoriatic arthritis   GERD (gastroesophageal reflux disease)   Hypertension   Type II or unspecified type diabetes mellitus without mention of complication, not stated as uncontrolled Nonalcoholic fatty liver disease   Insomnia   Discharge Condition: stable  Filed Weights   04/24/13 2302 04/25/13 0515  Weight: 122.471 kg (270 lb) 121.4 kg (267 lb 10.2 oz)    History of present illness:  57 y.o. male  has a past medical history of Hypertension; Diabetes mellitus without complication; Stomach ulcer; Acid reflux; Neuropathy of both feet; Kidney stone; Post-traumatic hydrocephalus; Psoriasis; and Eczema.  Presented with  He started to have sever supra pubic pain and presented to [facility]. He was having low grade fever and some chills. Denies nausea vomiting or diarrhea. CT scan showed Acute sigmoid diverticulitis, without bowel perforation or abscess. Suspected adjacent epiploic appendagitis. Patient has been transferred to [facility] under hospitalist service. He was started on Imepenem in ED.  Of note patient is sp ventricular shunt due to traumatic hydrocephalus.   Hospital Course:  Admitted to medsurg.  Started on bowel rest, IV antibiotics, pain medications.  Diet advanced as pain improved.  Leukocytosis improved.  Afebrile, pain improved, tolerating a solid diet at discharge. Has never had screening colonoscopy.  Referred to GI as outpatient.    CT scan showed nodular liver contour.  Patient has no known history of liver disease, heavy alcohol use or viral hepatitis.  Hepatitis serologies normal.  Likely nonalcoholic fatty liver  disease  Other medical problems stable  Procedures:  none  Consultations:  none  Discharge Exam: Filed Vitals:   04/28/13 0529  BP: 123/75  Pulse: 67  Temp: 98.2 F (36.8 C)  Resp: 19    General: comfortable Cardiovascular: RRR without MGR Respiratory: CTA without WRR Abd: S, NT, ND Ext: no CCE  Discharge Instructions  Discharge Orders   Future Orders Complete By Expires   Activity as tolerated - No restrictions  As directed    Discharge instructions  As directed    Comments:     Low fiber for 2 weeks, then high fiber, heart healthy       Medication List         acetaminophen 325 MG tablet  Commonly known as:  TYLENOL  Take 2 tablets (650 mg total) by mouth every 6 (six) hours as needed.     ALEVE PO  Take 1 tablet by mouth every 12 (twelve) hours as needed (pain).     carvedilol 6.25 MG tablet  Commonly known as:  COREG  Take 12.5 mg by mouth daily.     ciprofloxacin 500 MG tablet  Commonly known as:  CIPRO  Take 1 tablet (500 mg total) by mouth 2 (two) times daily.     COZAAR 100 MG tablet  Generic drug:  losartan  Take 1 tablet (100 mg total) by mouth daily.     DULoxetine 60 MG capsule  Commonly known as:  CYMBALTA  Take 120 mg by mouth daily.     gabapentin 100 MG capsule  Commonly known as:  NEURONTIN  Take 100 mg by mouth daily.     HUMIRA 40 MG/0.8ML injection  Generic drug:  adalimumab  Inject 0.8 mLs (40 mg total) into the skin every 14 (fourteen) days.     metFORMIN 500 MG 24 hr tablet  Commonly known as:  GLUCOPHAGE-XR  Take 500 mg by mouth daily with breakfast.     metroNIDAZOLE 500 MG tablet  Commonly known as:  FLAGYL  Take 1 tablet (500 mg total) by mouth every 8 (eight) hours.     omeprazole 40 MG capsule  Commonly known as:  PRILOSEC  Take 40 mg by mouth daily.     oxyCODONE 5 MG immediate release tablet  Commonly known as:  Oxy IR/ROXICODONE  Take 1 tablet (5 mg total) by mouth every 6 (six) hours as needed.      PRESCRIPTION MEDICATION  Inject 1 vial into the muscle See admin instructions. Testosterone injection at Dr. Arsenio Loader office at [facility] in [place].  Receives every few months when doctor instructs him.       Allergies  Allergen Reactions  . Morphine And Related Other (See Comments)    hallucinations  . Sulfa Drugs Cross Reactors Rash       Follow-up Information   Follow up with Scarlette Shorts, MD. Schedule an appointment as soon as possible for a visit in 2 months. (TO SCHEDULE COLONOSCOPY)    Specialty:  Gastroenterology   Contact information:   [address] [place] [address] [phone]        The results of significant diagnostics from this hospitalization (including imaging, microbiology, ancillary and laboratory) are listed below for reference.    Significant Diagnostic Studies: Ct Abdomen Pelvis W Contrast  04/25/2013   CLINICAL DATA:  Abdominal pain, a ventriculoperitoneal shunts, fever.  EXAM: CT ABDOMEN AND PELVIS WITH CONTRAST  TECHNIQUE: Multidetector CT imaging of the abdomen and pelvis was performed using the standard protocol following bolus administration of intravenous contrast.  CONTRAST:  24m OMNIPAQUE IOHEXOL 300 MG/ML SOLN, 1014mOMNIPAQUE IOHEXOL 300 MG/ML SOLN  COMPARISON:  CT of the abdomen and pelvis June 10, 2012  FINDINGS: Limited view of the lung bases are clear. Included heart and pericardium are unremarkable.  Sigmoid diverticulosis with superimposed inflammatory changes, no bowel perforation, abscess or free fluid in the pelvis. Adjacent to this sigmoid colon is a mildly enhancing fatty the 2.7 x 1.2 cm present epiploic appendagitis, axial 73/96. Ventriculoperitoneal shunt in place, terminating in the pelvis. The stomach, small bowel are unremarkable, contrast has yet to reach the large bowel. Normal appendix.  Fatty liver, with nodular contour concerning for cirrhosis. The spleen, pancreas, adrenal glands and gallbladder are  unremarkable.  Normal appearance of the kidneys. Great vessels are normal in course and caliber. Urinary bladder is partially distended, unremarkable ; evaluation the pelvis is limited by streak artifact from left hip arthroplasty. Deformity of the left iliac bone may reflect remote injury. Degenerative change of the lumbar spine.  IMPRESSION: Acute sigmoid diverticulitis, without bowel perforation or abscess. Suspected adjacent epiploic appendagitis.  Nodule liver concerning for cirrhosis.   Electronically Signed   By: CoElon Alas On: 04/25/2013 03:18    Microbiology: Recent Results (from the past 240 hour(s))  URINE CULTURE     Status: None   Collection Time    04/25/13  6:43 PM      Result Value Range Status   Specimen Description URINE, CLEAN CATCH   Final   Special Requests Immunocompromised   Final   Culture  Setup Time     Final   Value: 04/26/2013 02:39  Performed at [place]     Final   Value: NO GROWTH     Performed at [facility]   Culture     Final   Value: NO GROWTH     Performed at [facility]   Report Status 04/27/2013 FINAL   Final  CULTURE, BLOOD (ROUTINE X 2)     Status: None   Collection Time    04/25/13  8:32 PM      Result Value Range Status   Specimen Description BLOOD RIGHT FOREARM   Final   Special Requests     Final   Value: BOTTLES DRAWN AEROBIC AND ANAEROBIC 10CC AER,8CC ANA   Culture  Setup Time     Final   Value: 04/26/2013 02:05     Performed at [facility]   Culture     Final   Value:        BLOOD CULTURE RECEIVED NO GROWTH TO DATE CULTURE WILL BE HELD FOR 5 DAYS BEFORE ISSUING A FINAL NEGATIVE REPORT     Performed at [facility]   Report Status PENDING   Incomplete  CULTURE, BLOOD (ROUTINE X 2)     Status: None   Collection Time    04/25/13  8:39 PM      Result Value Range Status   Specimen Description BLOOD RIGHT HAND   Final   Special Requests BOTTLES DRAWN AEROBIC AND  ANAEROBIC 10CC   Final   Culture  Setup Time     Final   Value: 04/26/2013 02:04     Performed at [facility]   Culture     Final   Value:        BLOOD CULTURE RECEIVED NO GROWTH TO DATE CULTURE WILL BE HELD FOR 5 DAYS BEFORE ISSUING A FINAL NEGATIVE REPORT     Performed at [facility]   Report Status PENDING   Incomplete     Labs: Basic Metabolic Panel:  Recent Labs Lab 04/25/13 0155 04/26/13 0510  NA 135 136  K 4.7 4.2  CL 98 104  CO2 26 22  GLUCOSE 152* 130*  BUN 17 14  CREATININE 1.10 1.15  CALCIUM 10.3 7.7*  MG  --  1.7  PHOS  --  2.2*   Liver Function Tests:  Recent Labs Lab 04/25/13 0155 04/26/13 0510  AST 42* 23  ALT 58* 32  ALKPHOS 118* 88  BILITOT 1.3* 1.9*  PROT 9.5* 7.0  ALBUMIN 4.0 2.8*   No results found for this basename: LIPASE, AMYLASE,  in the last 168 hours No results found for this basename: AMMONIA,  in the last 168 hours CBC:  Recent Labs Lab 04/25/13 0155 04/26/13 0510  WBC 15.4* 12.3*  NEUTROABS 11.8* 8.4*  HGB 16.3 12.6*  HCT 46.8 36.4*  MCV 89.5 89.7  PLT 206 144*   Cardiac Enzymes: No results found for this basename: CKTOTAL, CKMB, CKMBINDEX, TROPONINI,  in the last 168 hours BNP: BNP (last 3 results) No results found for this basename: PROBNP,  in the last 8760 hours CBG:  Recent Labs Lab 04/27/13 1154 04/27/13 1659 04/27/13 2152 04/28/13 0743 04/28/13 1210  GLUCAP 99 86 96 89 116*    Signed:  Kailynne Ferrington L  [facility] 04/28/2013, 12:20 PM

## 2013-05-02 LAB — CULTURE, BLOOD (ROUTINE X 2): Culture: NO GROWTH

## 2013-06-02 ENCOUNTER — Encounter: Payer: Self-pay | Admitting: Internal Medicine

## 2013-06-05 ENCOUNTER — Encounter: Payer: Self-pay | Admitting: Internal Medicine

## 2013-07-24 ENCOUNTER — Ambulatory Visit (AMBULATORY_SURGERY_CENTER): Payer: Self-pay | Admitting: *Deleted

## 2013-07-24 ENCOUNTER — Telehealth: Payer: Self-pay | Admitting: *Deleted

## 2013-07-24 VITALS — Ht 72.0 in | Wt 269.2 lb

## 2013-07-24 DIAGNOSIS — Z1211 Encounter for screening for malignant neoplasm of colon: Secondary | ICD-10-CM

## 2013-07-24 MED ORDER — MOVIPREP 100 G PO SOLR: ORAL | Status: DC

## 2013-07-24 NOTE — Telephone Encounter (Signed)
Talked with pt; he is aware that colonoscopy will proceed as scheduled

## 2013-07-24 NOTE — Progress Notes (Signed)
No allergies to eggs or soy. No problems with anesthesia.

## 2013-07-24 NOTE — Telephone Encounter (Signed)
No answer.  Will call pt later

## 2013-07-24 NOTE — Telephone Encounter (Signed)
[place] for direct.

## 2013-07-24 NOTE — Telephone Encounter (Signed)
Dr Henrene Pastor: pt is scheduled for direct colonoscopy on 08/02/13.  He was admitted to hospital 10/28 for diverticulitis.  He did not have any follow up care after discharge.  During PV he says that he continues to have right sided abdominal pain intermittently since hospital admission.  Is he okay for direct colonoscopy or does he need OV with you before scheduling colon?  Thanks, Juliann Pulse

## 2013-08-02 ENCOUNTER — Encounter: Payer: Self-pay | Admitting: Internal Medicine

## 2013-08-02 ENCOUNTER — Ambulatory Visit (AMBULATORY_SURGERY_CENTER): Payer: 59 | Admitting: Internal Medicine

## 2013-08-02 VITALS — BP 118/78 | HR 74 | Temp 97.3°F | Resp 16 | Ht 72.0 in | Wt 269.0 lb

## 2013-08-02 DIAGNOSIS — D126 Benign neoplasm of colon, unspecified: Secondary | ICD-10-CM

## 2013-08-02 DIAGNOSIS — Z1211 Encounter for screening for malignant neoplasm of colon: Secondary | ICD-10-CM

## 2013-08-02 DIAGNOSIS — Z86010 Personal history of colonic polyps: Secondary | ICD-10-CM

## 2013-08-02 LAB — GLUCOSE, CAPILLARY
Glucose-Capillary: 157 mg/dL — ABNORMAL HIGH (ref 70–99)
Glucose-Capillary: 124 mg/dL — ABNORMAL HIGH (ref 70–99)

## 2013-08-02 MED ORDER — SODIUM CHLORIDE 0.9 % IV SOLN: 500.0000 mL | INTRAVENOUS | Status: DC

## 2013-08-02 NOTE — Patient Instructions (Signed)
YOU HAD AN ENDOSCOPIC PROCEDURE TODAY AT THE [facility]: Refer to the procedure report that was given to you for any specific questions about what was found during the examination.  If the procedure report does not answer your questions, please call your gastroenterologist to clarify.  If you requested that your care partner not be given the details of your procedure findings, then the procedure report has been included in a sealed envelope for you to review at your convenience later.  YOU SHOULD EXPECT: Some feelings of bloating in the abdomen. Passage of more gas than usual.  Walking can help get rid of the air that was put into your GI tract during the procedure and reduce the bloating. If you had a lower endoscopy (such as a colonoscopy or flexible sigmoidoscopy) you may notice spotting of blood in your stool or on the toilet paper. If you underwent a bowel prep for your procedure, then you may not have a normal bowel movement for a few days.  DIET: Your first meal following the procedure should be a light meal and then it is ok to progress to your normal diet.  A half-sandwich or bowl of soup is an example of a good first meal.  Heavy or fried foods are harder to digest and may make you feel nauseous or bloated.  Likewise meals heavy in dairy and vegetables can cause extra gas to form and this can also increase the bloating.  Drink plenty of fluids but you should avoid alcoholic beverages for 24 hours.  ACTIVITY: Your care partner should take you home directly after the procedure.  You should plan to take it easy, moving slowly for the rest of the day.  You can resume normal activity the day after the procedure however you should NOT DRIVE or use heavy machinery for 24 hours (because of the sedation medicines used during the test).    SYMPTOMS TO REPORT IMMEDIATELY: A gastroenterologist can be reached at any hour.  During normal business hours, 8:30 AM to 5:00 PM Monday through Friday,  call [phone].  After hours and on weekends, please call the GI answering service at [phone] who will take a message and have the physician on call contact you.   Following lower endoscopy (colonoscopy or flexible sigmoidoscopy):  Excessive amounts of blood in the stool  Significant tenderness or worsening of abdominal pains  Swelling of the abdomen that is new, acute  Fever of 100F or higher    FOLLOW UP: If any biopsies were taken you will be contacted by phone or by letter within the next 1-3 weeks.  Call your gastroenterologist if you have not heard about the biopsies in 3 weeks.  Our staff will call the home number listed on your records the next business day following your procedure to check on you and address any questions or concerns that you may have at that time regarding the information given to you following your procedure. This is a courtesy call and so if there is no answer at the home number and we have not heard from you through the emergency physician on call, we will assume that you have returned to your regular daily activities without incident.  SIGNATURES/CONFIDENTIALITY: You and/or your care partner have signed paperwork which will be entered into your electronic medical record.  These signatures attest to the fact that that the information above on your After Visit Summary has been reviewed and is understood.  Full responsibility of the confidentiality   of this discharge information lies with you and/or your care-partner.     

## 2013-08-02 NOTE — Op Note (Signed)
[facility]  [address] [place], [address]   COLONOSCOPY PROCEDURE REPORT  PATIENT: James [name], James [name]  MR#: [id number] BIRTHDATE: 1955-10-21 , 77  yrs. old GENDER: Male ENDOSCOPIST: Eustace Quail, MD REFERRED [id number] Tonita Cong, M.D. PROCEDURE DATE:  08/02/2013 PROCEDURE:   Colonoscopy with snare polypectomy x 1 First Screening Colonoscopy - Avg.  risk and is 50 yrs.  old or older Yes.  Prior Negative Screening - Now for repeat screening. N/A  History of Adenoma - Now for follow-up colonoscopy & has been > or = to 3 yrs.  N/A  Polyps Removed Today? Yes. ASA CLASS:   Class II INDICATIONS:average risk screening. MEDICATIONS: MAC sedation, administered by CRNA and propofol (Diprivan) 279m IV  DESCRIPTION OF PROCEDURE:   After the risks benefits and alternatives of the procedure were thoroughly explained, informed consent was obtained.  A digital rectal exam revealed no abnormalities of the rectum.   The LB [id number] endoscope was introduced through the anus and advanced to the cecum, which was identified by both the appendix and ileocecal valve. No adverse events experienced.   The quality of the prep was excellent, using MoviPrep  The instrument was then slowly withdrawn as the colon was fully examined.      COLON FINDINGS: A pedunculated polyp measuring 8 mm in size was found in the sigmoid colon.  A polypectomy was performed using snare cautery.  The resection was complete and the polyp tissue was completely retrieved.   Moderate diverticulosis was noted throughout the entire examined colon.   The colon mucosa was otherwise normal.  Retroflexed views revealed internal hemorrhoids. The time to cecum=2 minutes 26 seconds.  Withdrawal time=12 minutes 04 seconds.  The scope was withdrawn and the procedure completed. COMPLICATIONS: There were no complications.  ENDOSCOPIC IMPRESSION: 1.   Pedunculated polyp measuring 8 mm in size was found in  the sigmoid colon; polypectomy was performed using snare cautery 2.   Moderate diverticulosis was noted throughout the entire examined colon 3.   The colon mucosa was otherwise normal  RECOMMENDATIONS: Repeat colonoscopy in 5 years if polyp adenomatous; otherwise 10 years   eSigned:  JEustace Quail MD 08/02/2013 11:49 AM   cc: The Patient and  LLanier Prude MD   PATIENT NAME:  [name], CatoMR#: [id number]

## 2013-08-02 NOTE — Progress Notes (Signed)
Called to room to assist during endoscopic procedure.  Patient ID and intended procedure confirmed with present staff. Received instructions for my participation in the procedure from the performing physician. ewm 

## 2013-08-02 NOTE — Progress Notes (Signed)
Report to pacu rn, vss, bbs=clear 

## 2013-08-03 ENCOUNTER — Telehealth: Payer: Self-pay

## 2013-08-03 NOTE — Telephone Encounter (Signed)
  Follow up Call-  Call back number 08/02/2013  Post procedure Call Back phone  # [phone]  Permission to leave phone message Yes     Patient questions:  Do you have a fever, pain , or abdominal swelling? no Pain Score  0 *  Have you tolerated food without any problems? yes  Have you been able to return to your normal activities? yes  Do you have any questions about your discharge instructions: Diet   no Medications  no Follow up visit  no  Do you have questions or concerns about your Care? no  Actions: * If pain score is 4 or above: No action needed, pain <4.

## 2013-08-07 ENCOUNTER — Encounter: Payer: Self-pay | Admitting: Internal Medicine

## 2013-09-20 DIAGNOSIS — M2040 Other hammer toe(s) (acquired), unspecified foot: Secondary | ICD-10-CM | POA: Diagnosis not present

## 2013-10-04 DIAGNOSIS — R197 Diarrhea, unspecified: Secondary | ICD-10-CM | POA: Diagnosis not present

## 2013-10-04 DIAGNOSIS — N179 Acute kidney failure, unspecified: Secondary | ICD-10-CM | POA: Diagnosis not present

## 2013-10-04 DIAGNOSIS — IMO0002 Reserved for concepts with insufficient information to code with codable children: Secondary | ICD-10-CM | POA: Diagnosis not present

## 2013-10-04 DIAGNOSIS — L408 Other psoriasis: Secondary | ICD-10-CM | POA: Diagnosis not present

## 2013-10-04 DIAGNOSIS — IMO0001 Reserved for inherently not codable concepts without codable children: Secondary | ICD-10-CM | POA: Diagnosis present

## 2013-10-04 DIAGNOSIS — L4050 Arthropathic psoriasis, unspecified: Secondary | ICD-10-CM | POA: Diagnosis not present

## 2013-10-04 DIAGNOSIS — I10 Essential (primary) hypertension: Secondary | ICD-10-CM | POA: Diagnosis not present

## 2013-10-04 DIAGNOSIS — E875 Hyperkalemia: Secondary | ICD-10-CM | POA: Diagnosis present

## 2013-10-04 DIAGNOSIS — M6281 Muscle weakness (generalized): Secondary | ICD-10-CM | POA: Diagnosis not present

## 2013-10-04 DIAGNOSIS — Z8249 Family history of ischemic heart disease and other diseases of the circulatory system: Secondary | ICD-10-CM | POA: Diagnosis not present

## 2013-10-04 DIAGNOSIS — N178 Other acute kidney failure: Secondary | ICD-10-CM | POA: Diagnosis not present

## 2013-10-04 DIAGNOSIS — E1165 Type 2 diabetes mellitus with hyperglycemia: Secondary | ICD-10-CM | POA: Diagnosis not present

## 2013-10-04 DIAGNOSIS — R112 Nausea with vomiting, unspecified: Secondary | ICD-10-CM | POA: Diagnosis not present

## 2013-10-04 DIAGNOSIS — R5381 Other malaise: Secondary | ICD-10-CM | POA: Diagnosis not present

## 2013-10-04 DIAGNOSIS — E876 Hypokalemia: Secondary | ICD-10-CM | POA: Diagnosis not present

## 2013-10-04 DIAGNOSIS — Z833 Family history of diabetes mellitus: Secondary | ICD-10-CM | POA: Diagnosis not present

## 2013-10-04 DIAGNOSIS — Z79899 Other long term (current) drug therapy: Secondary | ICD-10-CM | POA: Diagnosis not present

## 2013-10-04 DIAGNOSIS — E872 Acidosis, unspecified: Secondary | ICD-10-CM | POA: Diagnosis present

## 2013-10-04 DIAGNOSIS — R945 Abnormal results of liver function studies: Secondary | ICD-10-CM | POA: Diagnosis not present

## 2013-10-04 DIAGNOSIS — A0472 Enterocolitis due to Clostridium difficile, not specified as recurrent: Secondary | ICD-10-CM | POA: Diagnosis not present

## 2013-10-04 DIAGNOSIS — R404 Transient alteration of awareness: Secondary | ICD-10-CM | POA: Diagnosis not present

## 2013-10-04 DIAGNOSIS — E119 Type 2 diabetes mellitus without complications: Secondary | ICD-10-CM | POA: Diagnosis not present

## 2013-10-04 DIAGNOSIS — R5383 Other fatigue: Secondary | ICD-10-CM | POA: Diagnosis not present

## 2013-10-04 DIAGNOSIS — E860 Dehydration: Secondary | ICD-10-CM | POA: Diagnosis not present

## 2013-10-23 DIAGNOSIS — K5730 Diverticulosis of large intestine without perforation or abscess without bleeding: Secondary | ICD-10-CM | POA: Insufficient documentation

## 2013-10-23 DIAGNOSIS — G911 Obstructive hydrocephalus: Secondary | ICD-10-CM | POA: Diagnosis not present

## 2013-10-23 DIAGNOSIS — G932 Benign intracranial hypertension: Secondary | ICD-10-CM | POA: Diagnosis not present

## 2013-10-23 DIAGNOSIS — Z982 Presence of cerebrospinal fluid drainage device: Secondary | ICD-10-CM | POA: Diagnosis not present

## 2013-11-04 ENCOUNTER — Encounter: Payer: 59 | Attending: General Practice

## 2013-11-04 VITALS — Ht 71.0 in | Wt 261.6 lb

## 2013-11-04 DIAGNOSIS — Z713 Dietary counseling and surveillance: Secondary | ICD-10-CM | POA: Insufficient documentation

## 2013-11-04 DIAGNOSIS — E119 Type 2 diabetes mellitus without complications: Secondary | ICD-10-CM | POA: Insufficient documentation

## 2013-11-04 NOTE — Progress Notes (Signed)
Patient was seen on 11/04/13 for the complete diabetes self-management series at the [facility]. This is a part of the [facility] [employer].  Current A1c = 9.1 on 09/2013  Handouts given during class include:  Living Well with Diabetes book  Carb Counting and Meal Planning book  Meal Plan Card  Carbohydrate guide  Meal planning worksheet  Low Sodium Flavoring Tips  The diabetes portion plate  Low Carbohydrate Snack Suggestions  A1c to eAG Conversion Chart  Diabetes Medications  Stress Management  Diabetes Recommended Care Schedule  Diabetes Success Plan  Core Class Satisfaction Survey  The following learning objectives were met by the patient during this course:  Describe diabetes  State some common risk factors for diabetes  Defines the role of glucose and insulin  Identifies type of diabetes and pathophysiology  Describe the relationship between diabetes and cardiovascular risk  State the members of the Healthcare Team  States the rationale for glucose monitoring  State when to test glucose  State their individual Target Range  State the importance of logging glucose readings  Describe how to interpret glucose readings  Identifies A1C target  Explain the correlation between A1c and eAG values  State symptoms and treatment of high blood glucose  State symptoms and treatment of low blood glucose  Explain proper technique for glucose testing  Identifies proper sharps disposal  Describe the role of different macronutrients on glucose  Explain how carbohydrates affect blood glucose  State what foods contain the most carbohydrates  Demonstrate carbohydrate counting  Demonstrate how to read Nutrition Facts food label  Describe effects of various fats on heart health  Describe the importance of good nutrition for health and healthy eating strategies  Describe techniques for managing your shopping, cooking and  meal planning  List strategies to follow meal plan when dining out  Describe the effects of alcohol on glucose and how to use it safely   State the amount of activity recommended for healthy living   Describe activities suitable for individual needs   Identify ways to regularly incorporate activity into daily life   Identify barriers to activity and ways to over come these barriers  Identify diabetes medications being personally used and their primary action for lowering glucose and possible side effects   Describe role of stress on blood glucose and develop strategies to address psychosocial issues   Identify diabetes complications and ways to prevent them  Explain how to manage diabetes during illness   Evaluate success in meeting personal goal   Establish 2-3 goals that they will plan to diligently work on until they return for the  19-monthfollow-up visit  Goals:  Follow Diabetes Meal Plan as instructed  Eat 3 meals and 2 snacks, every 3-5 hrs  Limit carbohydrate intake to 45 grams carbohydrate/meal Limit carbohydrate intake to 15 grams carbohydrate/snack Add lean protein foods to meals/snacks  Monitor glucose levels as instructed by your doctor  Aim for 15-30 mins of physical activity daily as tolerated  Bring food record and glucose log to all healthcare visits  Your patient has established the following 4 month goals in their individualized success plan:  Count carbohydrates at most meals and snacks  Be active 15 minutes 3 times a week  Test glucose BID  Your patient has identified these potential barriers to change:  motivation  Your patient has identified their diabetes self-care support plan as  [facility]  American Diabetes Association web site  wife  Plan: Attend Core 4 in 4 months

## 2013-12-14 DIAGNOSIS — L408 Other psoriasis: Secondary | ICD-10-CM | POA: Diagnosis not present

## 2013-12-14 DIAGNOSIS — Z79899 Other long term (current) drug therapy: Secondary | ICD-10-CM | POA: Diagnosis not present

## 2014-01-05 DIAGNOSIS — L408 Other psoriasis: Secondary | ICD-10-CM | POA: Diagnosis not present

## 2014-05-30 ENCOUNTER — Ambulatory Visit: Payer: 59 | Admitting: Internal Medicine

## 2014-06-05 ENCOUNTER — Encounter: Payer: Self-pay | Admitting: Internal Medicine

## 2014-06-05 ENCOUNTER — Ambulatory Visit (INDEPENDENT_AMBULATORY_CARE_PROVIDER_SITE_OTHER): Payer: 59 | Admitting: Internal Medicine

## 2014-06-05 VITALS — BP 123/80 | HR 78 | Temp 98.3°F | Wt 263.4 lb

## 2014-06-05 DIAGNOSIS — N200 Calculus of kidney: Secondary | ICD-10-CM | POA: Insufficient documentation

## 2014-06-05 DIAGNOSIS — R339 Retention of urine, unspecified: Secondary | ICD-10-CM

## 2014-06-05 DIAGNOSIS — I10 Essential (primary) hypertension: Secondary | ICD-10-CM

## 2014-06-05 DIAGNOSIS — L4050 Arthropathic psoriasis, unspecified: Secondary | ICD-10-CM

## 2014-06-05 DIAGNOSIS — E291 Testicular hypofunction: Secondary | ICD-10-CM

## 2014-06-05 DIAGNOSIS — E349 Endocrine disorder, unspecified: Secondary | ICD-10-CM

## 2014-06-05 DIAGNOSIS — K7460 Unspecified cirrhosis of liver: Secondary | ICD-10-CM

## 2014-06-05 DIAGNOSIS — E1142 Type 2 diabetes mellitus with diabetic polyneuropathy: Secondary | ICD-10-CM

## 2014-06-05 LAB — COMPREHENSIVE METABOLIC PANEL
ALBUMIN: 4.2 g/dL (ref 3.5–5.2)
ALT: 52 U/L (ref 0–53)
AST: 30 U/L (ref 0–37)
Alkaline Phosphatase: 108 U/L (ref 39–117)
BUN: 20 mg/dL (ref 6–23)
CALCIUM: 10 mg/dL (ref 8.4–10.5)
CHLORIDE: 101 meq/L (ref 96–112)
CO2: 22 mEq/L (ref 19–32)
Creatinine, Ser: 1.4 mg/dL (ref 0.4–1.5)
GFR: 55.21 mL/min — ABNORMAL LOW (ref >60.00)
GLUCOSE: 289 mg/dL — AB (ref 70–99)
Potassium: 4.4 mEq/L (ref 3.5–5.1)
SODIUM: 136 meq/L (ref 135–145)
Total Bilirubin: 1.2 mg/dL (ref 0.2–1.2)
Total Protein: 8.9 g/dL — ABNORMAL HIGH (ref 6.0–8.3)

## 2014-06-05 LAB — CBC WITH DIFFERENTIAL/PLATELET
BASOS PCT: 0.3 % (ref 0.0–3.0)
Basophils Absolute: 0 10*3/uL (ref 0.0–0.1)
EOS ABS: 0.2 10*3/uL (ref 0.0–0.7)
Eosinophils Relative: 2.3 % (ref 0.0–5.0)
HCT: 42.7 % (ref 39.0–52.0)
Hemoglobin: 14.9 g/dL (ref 13.0–17.0)
Lymphocytes Relative: 23.9 % (ref 12.0–46.0)
Lymphs Abs: 1.6 10*3/uL (ref 0.7–4.0)
MCHC: 34.9 g/dL (ref 30.0–36.0)
MCV: 92.3 fl (ref 78.0–100.0)
MONO ABS: 0.5 10*3/uL (ref 0.1–1.0)
Monocytes Relative: 6.9 % (ref 3.0–12.0)
NEUTROS PCT: 66.6 % (ref 43.0–77.0)
Neutro Abs: 4.5 10*3/uL (ref 1.4–7.7)
Platelets: 174 10*3/uL (ref 150.0–400.0)
RBC: 4.63 Mil/uL (ref 4.22–5.81)
RDW: 14.4 % (ref 11.5–15.5)
WBC: 6.7 10*3/uL (ref 4.0–10.5)

## 2014-06-05 LAB — PROTIME-INR
INR: 1 ratio (ref 0.8–1.0)
Prothrombin Time: 10.8 s (ref 9.6–13.1)

## 2014-06-05 LAB — HEMOGLOBIN A1C: HEMOGLOBIN A1C: 9.4 % — AB (ref 4.6–6.5)

## 2014-06-05 LAB — APTT: aPTT: 26.3 s (ref 23.4–32.7)

## 2014-06-05 MED ORDER — GABAPENTIN 100 MG PO CAPS: 100.0000 mg | ORAL_CAPSULE | Freq: Three times a day (TID) | ORAL | Status: DC

## 2014-06-05 NOTE — Progress Notes (Signed)
Subjective:    Patient ID: James [name], male    DOB: 1956-06-29, 58 y.o.   MRN: [id number]  DOS:  06/05/2014 Type of visit - description : new pt, transferring from Dr Tonita Cong @ [place] who left the practice Today we used most of the visit  time getting familiar with his multiple medical problems  Diabetes and a history of neuropathy, good compliance with metformin and gabapentin, neuropathy symptoms not completely well controlled. CBGs recently where high, as high as in the 300s.  Hypertension, good medication compliance, not ambulatory BPs  Low testosterone, no treatment at this time  History of psoriasis, under the care of dermatology, symptoms not completely well control. See assessment and plan    ROS Was seen 3 weeks ago at another office with respiratory symptoms, status post antibiotics, feeling better. Denies chest pain or difficulty breathing No nausea, vomiting, diarrhea No dysuria, gross hematuria or difficulty urinating  Past Medical History  Diagnosis Date  . Hypertension   . Diabetes mellitus without complication     borderline per primary MD  . Stomach ulcer   . Acid reflux   . Neuropathy of both feet   . Kidney stone   . Post-traumatic hydrocephalus     s/p shunts x 2 (first got infected )  . Psoriasis     sees Dr [name]  . Eczema   . Diverticulitis 03/2013  . Testosterone deficiency 04/28/2011  . Insomnia 04/26/2013    Past Surgical History  Procedure Laterality Date  . Total hip arthroplasty Left 2011  . Shoulder surgery Left 2010  . Back surgery  1980  . Ventriculoperitoneal shunt  2007    x2    History   Social History  . Marital Status: Married    Spouse Name: [name]    Number of Children: 2  . Years of Education: N/A   Occupational History  . disable     Social History Main Topics  . Smoking status: Never Smoker   . Smokeless tobacco: Never Used     Comment: Tried when younger.    . Alcohol Use: No  . Drug Use: No  .  Sexual Activity: Not on file   Other Topics Concern  . Not on file   Social History Narrative   Household-- pt , wife, one children     Family History  Problem Relation Age of Onset  . Lung cancer Father     alive, former smoker   . Heart disease Brother     MI age 68  . Diabetes Neg Hx   . Prostate cancer Neg Hx   . Colon cancer Neg Hx       Medication List       This list is accurate as of: 06/05/14  5:31 PM.  Always use your most recent med list.               acetaminophen 325 MG tablet  Commonly known as:  TYLENOL  Take 2 tablets (650 mg total) by mouth every 6 (six) hours as needed.     acitretin 25 MG capsule  Commonly known as:  SORIATANE  Take 25 mg by mouth daily before breakfast.     ALEVE PO  Take 1 tablet by mouth every 12 (twelve) hours as needed (pain).     carvedilol 6.25 MG tablet  Commonly known as:  COREG  Take 12.5 mg by mouth daily.     COSENTYX [place]  Inject 300 mg  into the skin. Every 4 weeks     COZAAR 100 MG tablet  Generic drug:  losartan  Take 1 tablet (100 mg total) by mouth daily.     DULoxetine 60 MG capsule  Commonly known as:  CYMBALTA  Take 120 mg by mouth daily.     gabapentin 100 MG capsule  Commonly known as:  NEURONTIN  Take 1 capsule (100 mg total) by mouth 3 (three) times daily.     metFORMIN 500 MG 24 hr tablet  Commonly known as:  GLUCOPHAGE-XR  Take 500 mg by mouth daily with breakfast.     omeprazole 40 MG capsule  Commonly known as:  PRILOSEC  Take 40 mg by mouth daily.           Objective:   Physical Exam BP 123/80 mmHg  Pulse 78  Temp(Src) 98.3 F (36.8 C) (Oral)  Wt 263 lb 6 oz (119.466 kg)  SpO2 98%  General -- alert, well-developed, NAD.  Neck --no thyromegaly  HEENT-- Not pale.  Lungs -- normal respiratory effort, no intercostal retractions, no accessory muscle use, and normal breath sounds.  Heart-- normal rate, regular rhythm, no murmur.  Abdomen-- Not distended, good bowel  sounds,soft, non-tender. Extremities-- no pretibial edema bilaterally  Neurologic--  alert & oriented X3. Speech normal, gait appropriate for age, strength symmetric and appropriate for age.  Psych-- Cognition and judgment appear intact. Cooperative with normal attention span and concentration. No anxious or depressed appearing.     Assessment & Plan:     F2F> 30

## 2014-06-05 NOTE — Assessment & Plan Note (Signed)
CT  2014 while inpatient for diverticulitis:  Fatty liver, with nodular contour concerning for cirrhosis. The spleen, pancreas, adrenal glands and gallbladder are unremarkable  No h/o ETOH Plan-- u/s, PT PTT LFTs

## 2014-06-05 NOTE — Assessment & Plan Note (Signed)
  good compliance w/ medication, not ambulatory BPs, BP today normal. Plan: Continue with carvedilol, losartan, check a BMP

## 2014-06-05 NOTE — Assessment & Plan Note (Signed)
Hypogonadism, Has taken HRT on and off, never stay on it for too long, when he was using hormones he did not feel any different or better subjectively

## 2014-06-05 NOTE — Patient Instructions (Signed)
Get your blood work before you leave   Increase gabapentin dose   SIGN A  release of information, I need to see the records from your previous PCP: Last 5 office visits, labs, x-rays and test of the last 3 years  Please come back to the office in 3 months for a routine check up   Come back fasting

## 2014-06-05 NOTE — Progress Notes (Signed)
Pre visit review using our clinic review tool, if applicable. No additional management support is needed unless otherwise documented below in the visit note. 

## 2014-06-05 NOTE — Assessment & Plan Note (Signed)
  Psoriasis, under the care of Dr. [name], they recently stop Humira because it wasn't working well, currently on cosentyx

## 2014-06-05 NOTE — Assessment & Plan Note (Signed)
Diabetes and neuropathy, We'll check A1c, continue with metformin Neuropathy --on gabapentin, symptoms not well-controlled, increased dose from 100 mg daily to 100 mg 3 times a day

## 2014-06-05 NOTE — Assessment & Plan Note (Signed)
Had mild sx when started cymbalta, now asx

## 2014-06-06 ENCOUNTER — Telehealth: Payer: Self-pay | Admitting: Internal Medicine

## 2014-06-06 MED ORDER — METFORMIN HCL ER 500 MG PO TB24: 500.0000 mg | ORAL_TABLET | Freq: Two times a day (BID) | ORAL | Status: DC

## 2014-06-06 MED ORDER — GLIMEPIRIDE 4 MG PO TABS: 4.0000 mg | ORAL_TABLET | Freq: Every day | ORAL | Status: DC

## 2014-06-06 NOTE — Telephone Encounter (Signed)
Advise patient: Diabetes needs better control Start glimepiride 4 mg 1 tablet with breakfast. (rx sent) Increase metformin from one tablet to 2 tablets daily Watch for low sugar symptoms, check CBGs twice a day, call if they are getting less than 100. Other labs okay,next visit in 3 months as planned

## 2014-06-06 NOTE — Telephone Encounter (Signed)
Spoke with [name], Pts wife, informed her of lab results, instructed her to inform Pt to begin Glimepiride 4 mg 1 tablet with breakfast and increase Metformin to 2 tablets daily. Informed her to check blood sugars daily (if Pt doesn't have meter we can supply one), call if they get below 100.

## 2014-06-07 ENCOUNTER — Ambulatory Visit (HOSPITAL_BASED_OUTPATIENT_CLINIC_OR_DEPARTMENT_OTHER)
Admission: RE | Admit: 2014-06-07 | Discharge: 2014-06-07 | Disposition: A | Discharge status: Home / Self Care | Payer: 59 | Source: Ambulatory Visit | Attending: Internal Medicine | Admitting: Internal Medicine

## 2014-06-07 DIAGNOSIS — E119 Type 2 diabetes mellitus without complications: Secondary | ICD-10-CM | POA: Diagnosis not present

## 2014-06-07 DIAGNOSIS — K760 Fatty (change of) liver, not elsewhere classified: Secondary | ICD-10-CM | POA: Insufficient documentation

## 2014-06-07 DIAGNOSIS — K7460 Unspecified cirrhosis of liver: Secondary | ICD-10-CM

## 2014-06-08 ENCOUNTER — Ambulatory Visit (HOSPITAL_BASED_OUTPATIENT_CLINIC_OR_DEPARTMENT_OTHER): Payer: 59

## 2014-07-01 ENCOUNTER — Telehealth: Payer: Self-pay | Admitting: Internal Medicine

## 2014-07-01 NOTE — Telephone Encounter (Signed)
Records reviewed. Will keep the relevant ones, others  will go back to the patient for safe keeping History of admission to the hospital with C. difficile April 2015 History of idiopathic intracranial hypertension Negative PPD 2012 Boostrix 05/22/2013  Labs from 09/22/2013: Creatinine 1.5, LFTs negative. Cholesterol 250, TG 582, LDL 124 A1c 9.7  Labs from 01/25/2014:  Creatinine 1.0, potassium 5.0, LFTs normal. Cholesterol 263, TG 434, LDL 166 A1c 6.8 A1c 6.8

## 2014-07-02 ENCOUNTER — Encounter: Payer: Self-pay | Admitting: Internal Medicine

## 2014-07-02 ENCOUNTER — Ambulatory Visit (INDEPENDENT_AMBULATORY_CARE_PROVIDER_SITE_OTHER): Payer: 59 | Admitting: Internal Medicine

## 2014-07-02 VITALS — BP 120/75 | HR 88 | Temp 98.3°F | Wt 265.2 lb

## 2014-07-02 DIAGNOSIS — J069 Acute upper respiratory infection, unspecified: Secondary | ICD-10-CM

## 2014-07-02 DIAGNOSIS — E1142 Type 2 diabetes mellitus with diabetic polyneuropathy: Secondary | ICD-10-CM

## 2014-07-02 MED ORDER — HYDROCODONE-HOMATROPINE 5-1.5 MG/5ML PO SYRP: 5.0000 mL | ORAL_SOLUTION | Freq: Four times a day (QID) | ORAL | Status: DC | PRN

## 2014-07-02 NOTE — Progress Notes (Signed)
Pre visit review using our clinic review tool, if applicable. No additional management support is needed unless otherwise documented below in the visit note. 

## 2014-07-02 NOTE — Patient Instructions (Addendum)
Rest, fluids , tylenol Take Mucinex DM twice a day x 1 week then as needed  If the cough severe take hydrocodone, will cause drowsiness  If nasal  congestion use OTC Nasocort or Flonase : 2 nasal sprays on each side of the nose daily until you feel better  Call if not gradually better over the next  10 days Call anytime if the symptoms are severe   Diabetes: Check your blood sugar   once a day at different times  GOALS: Fasting before a meal 70- 130 2 hours after a meal less than 180 At bedtime 90-150 Call if consistently not at goal ---- Eat 3 meals a day Watch for low sugars   Next visit by 08-2014  Hypoglycemia Hypoglycemia occurs when the glucose in your blood is too low. Glucose is a type of sugar that is your body's main energy source. Hormones, such as insulin and glucagon, control the level of glucose in the blood. Insulin lowers blood glucose and glucagon increases blood glucose. Having too much insulin in your blood stream, or not eating enough food containing sugar, can result in hypoglycemia. Hypoglycemia can happen to people with or without diabetes. It can develop quickly and can be a medical emergency.  CAUSES   Missing or delaying meals.  Not eating enough carbohydrates at meals.  Taking too much diabetes medicine.  Not timing your oral diabetes medicine or insulin doses with meals, snacks, and exercise.  Nausea and vomiting.  Certain medicines.  Severe illnesses, such as hepatitis, kidney disorders, and certain eating disorders.  Increased activity or exercise without eating something extra or adjusting medicines.  Drinking too much alcohol.  A nerve disorder that affects body functions like your heart rate, blood pressure, and digestion (autonomic neuropathy).  A condition where the stomach muscles do not function properly (gastroparesis). Therefore, medicines and food may not absorb properly.  Rarely, a tumor of the pancreas can produce too much  insulin. SYMPTOMS   Hunger.  Sweating (diaphoresis).  Change in body temperature.  Shakiness.  Headache.  Anxiety.  Lightheadedness.  Irritability.  Difficulty concentrating.  Dry mouth.  Tingling or numbness in the hands or feet.  Restless sleep or sleep disturbances.  Altered speech and coordination.  Change in mental status.  Seizures or prolonged convulsions.  Combativeness.  Drowsiness (lethargic).  Weakness.  Increased heart rate or palpitations.  Confusion.  Pale, gray skin color.  Blurred or double vision.  Fainting. DIAGNOSIS  A physical exam and medical history will be performed. Your caregiver may make a diagnosis based on your symptoms. Blood tests and other lab tests may be performed to confirm a diagnosis. Once the diagnosis is made, your caregiver will see if your signs and symptoms go away once your blood glucose is raised.  TREATMENT  Usually, you can easily treat your hypoglycemia when you notice symptoms.  Check your blood glucose. If it is less than 70 mg/dl, take one of the following:   3-4 glucose tablets.    cup juice.    cup regular soda.   1 cup skim milk.   -1 tube of glucose gel.   5-6 hard candies.   Avoid high-fat drinks or food that may delay a rise in blood glucose levels.  Do not take more than the recommended amount of sugary foods, drinks, gel, or tablets. Doing so will cause your blood glucose to go too high.   Wait 10-15 minutes and recheck your blood glucose. If it is still less  than 70 mg/dl or below your target range, repeat treatment.   Eat a snack if it is more than 1 hour until your next meal.  There may be a time when your blood glucose may go so low that you are unable to treat yourself at home when you start to notice symptoms. You may need someone to help you. You may even faint or be unable to swallow. If you cannot treat yourself, someone will need to bring you to the hospital.   [place]  If you have diabetes, follow your diabetes management plan by:  Taking your medicines as directed.  Following your exercise plan.  Following your meal plan. Do not skip meals. Eat on time.  Testing your blood glucose regularly. Check your blood glucose before and after exercise. If you exercise longer or different than usual, be sure to check blood glucose more frequently.  Wearing your medical alert jewelry that says you have diabetes.  Identify the cause of your hypoglycemia. Then, develop ways to prevent the recurrence of hypoglycemia.  Do not take a hot bath or shower right after an insulin shot.  Always carry treatment with you. Glucose tablets are the easiest to carry.  If you are going to drink alcohol, drink it only with meals.  Tell friends or family members ways to keep you safe during a seizure. This may include removing hard or sharp objects from the area or turning you on your side.  Maintain a healthy weight. SEEK MEDICAL CARE IF:   You are having problems keeping your blood glucose in your target range.  You are having frequent episodes of hypoglycemia.  You feel you might be having side effects from your medicines.  You are not sure why your blood glucose is dropping so low.  You notice a change in vision or a new problem with your vision. SEEK IMMEDIATE MEDICAL CARE IF:   Confusion develops.  A change in mental status occurs.  The inability to swallow develops.  Fainting occurs. Document Released: 06/15/2005 Document Revised: 06/20/2013 Document Reviewed: 10/12/2011 [facility] Patient Information 2015 [place], [place]. This information is not intended to replace advice given to you by your health care provider. Make sure you discuss any questions you have with your health care provider.

## 2014-07-02 NOTE — Progress Notes (Signed)
Subjective:    Patient ID: James [name], male    DOB: 1955/09/09, 59 y.o.   MRN: [id number]  DOS:  07/02/2014 Type of visit - description : acute Interval history:  Symptoms started 2 weeks ago with "deep" cough, no sputum production. Has not been taking any medications Also discussed diabetes, see assessment and plan.    ROS Diet ~ the same ,portions slightly smaller?. Denies fever, chills, aches or pains No nausea, vomiting, diarrhea. Denies wheezing, no history of asthma   Past Medical History  Diagnosis Date  . Hypertension   . Diabetes mellitus without complication     borderline per primary MD  . Stomach ulcer   . Acid reflux   . Neuropathy of both feet   . Kidney stone   . Post-traumatic hydrocephalus     s/p shunts x 2 (first got infected )  . Psoriasis     sees Dr [name]  . Eczema   . Diverticulitis 03/2013  . Testosterone deficiency 04/28/2011  . Insomnia 04/26/2013    Past Surgical History  Procedure Laterality Date  . Total hip arthroplasty Left 2011  . Shoulder surgery Left 2010  . Back surgery  1980  . Ventriculoperitoneal shunt  2007    x2    History   Social History  . Marital Status: Married    Spouse Name: [name]    Number of Children: 2  . Years of Education: N/A   Occupational History  . disable     Social History Main Topics  . Smoking status: Never Smoker   . Smokeless tobacco: Never Used     Comment: Tried when younger.    . Alcohol Use: No  . Drug Use: No  . Sexual Activity: Not on file   Other Topics Concern  . Not on file   Social History Narrative   Household-- pt , wife, one children        Medication List       This list is accurate as of: 07/02/14 11:59 PM.  Always use your most recent med list.               acetaminophen 325 MG tablet  Commonly known as:  TYLENOL  Take 2 tablets (650 mg total) by mouth every 6 (six) hours as needed.     acitretin 25 MG capsule  Commonly known as:  SORIATANE  Take  25 mg by mouth daily before breakfast.     ALEVE PO  Take 1 tablet by mouth every 12 (twelve) hours as needed (pain).     carvedilol 6.25 MG tablet  Commonly known as:  COREG  Take 12.5 mg by mouth daily.     COSENTYX [place]  Inject 300 mg into the skin. Every 4 weeks     COZAAR 100 MG tablet  Generic drug:  losartan  Take 1 tablet (100 mg total) by mouth daily.     DULoxetine 60 MG capsule  Commonly known as:  CYMBALTA  Take 120 mg by mouth daily.     gabapentin 100 MG capsule  Commonly known as:  NEURONTIN  Take 1 capsule (100 mg total) by mouth 3 (three) times daily.     glimepiride 4 MG tablet  Commonly known as:  AMARYL  Take 1 tablet (4 mg total) by mouth daily before breakfast.     HYDROcodone-homatropine 5-1.5 MG/5ML syrup  Commonly known as:  HYCODAN  Take 5 mLs by mouth every 6 (six) hours as  needed for cough.     metFORMIN 1000 MG tablet  Commonly known as:  GLUCOPHAGE  Take 1,000 mg by mouth 2 (two) times daily with a meal.     omeprazole 40 MG capsule  Commonly known as:  PRILOSEC  Take 40 mg by mouth daily.           Objective:   Physical Exam BP 120/75 mmHg  Pulse 88  Temp(Src) 98.3 F (36.8 C) (Oral)  Wt 265 lb 4 oz (120.317 kg)  SpO2 98% General -- alert, well-developed, NAD.  HEENT-- Not pale.  R Ear-- normal L ear-- normal Throat symmetric, no redness or discharge. Face symmetric, sinuses not tender to palpation. Nose not congested. Lungs -- normal respiratory effort, no intercostal retractions, no accessory muscle use, and normal breath sounds.  Heart-- normal rate, regular rhythm, no murmur.   Extremities-- no pretibial edema bilaterally  Neurologic--  alert & oriented X3. Speech normal, gait appropriate for age, strength symmetric and appropriate for age.  Psych-- Cognition and judgment appear intact. Cooperative with normal attention span and concentration. No anxious or depressed appearing.        Assessment & Plan:   URI, URI  with persisting cough. He has a history of C. difficile. Conservative treatment, will consider antibiotic in few days if he is not improving

## 2014-07-02 NOTE — Assessment & Plan Note (Signed)
Recent A1c was elevated, patient was already taking metformin 1000 mg daily, he is now taking 1000 mg twice a day. We had a conversation about diet and exercise, he is used to eat 1 time a day, I encouraged him to rather have 3  smaller size meals, he is somehow reluctant to my advice. No recent CBGs but a couple of times sugar has been as low as 60 (he remained asymptomatic while low sugars). We discussed the risk of low blood sugar and encouraged him to check daily and lett me know if the hypoglycemias are freq

## 2014-07-16 ENCOUNTER — Other Ambulatory Visit: Payer: Self-pay

## 2014-07-16 MED ORDER — GLUCOSE BLOOD VI STRP
ORAL_STRIP | Status: DC
Start: 2014-07-16 — End: 2015-08-09

## 2014-08-27 DIAGNOSIS — Z79899 Other long term (current) drug therapy: Secondary | ICD-10-CM | POA: Diagnosis not present

## 2014-08-27 DIAGNOSIS — L409 Psoriasis, unspecified: Secondary | ICD-10-CM | POA: Diagnosis not present

## 2014-08-27 LAB — LIPID PANEL: TRIGLYCERIDES: 427 mg/dL — AB (ref 40–160)

## 2014-09-04 ENCOUNTER — Ambulatory Visit (INDEPENDENT_AMBULATORY_CARE_PROVIDER_SITE_OTHER): Payer: 59 | Admitting: Internal Medicine

## 2014-09-04 ENCOUNTER — Telehealth: Payer: Self-pay

## 2014-09-04 ENCOUNTER — Other Ambulatory Visit: Payer: 59

## 2014-09-04 ENCOUNTER — Encounter: Payer: Self-pay | Admitting: Internal Medicine

## 2014-09-04 VITALS — BP 132/74 | HR 81 | Temp 98.0°F | Ht 71.0 in | Wt 264.4 lb

## 2014-09-04 DIAGNOSIS — I10 Essential (primary) hypertension: Secondary | ICD-10-CM

## 2014-09-04 DIAGNOSIS — R51 Headache: Secondary | ICD-10-CM

## 2014-09-04 DIAGNOSIS — R519 Headache, unspecified: Secondary | ICD-10-CM

## 2014-09-04 DIAGNOSIS — F329 Major depressive disorder, single episode, unspecified: Secondary | ICD-10-CM

## 2014-09-04 DIAGNOSIS — E1142 Type 2 diabetes mellitus with diabetic polyneuropathy: Secondary | ICD-10-CM

## 2014-09-04 DIAGNOSIS — E291 Testicular hypofunction: Secondary | ICD-10-CM

## 2014-09-04 DIAGNOSIS — F32A Depression, unspecified: Secondary | ICD-10-CM | POA: Insufficient documentation

## 2014-09-04 DIAGNOSIS — G4733 Obstructive sleep apnea (adult) (pediatric): Secondary | ICD-10-CM | POA: Insufficient documentation

## 2014-09-04 DIAGNOSIS — K7460 Unspecified cirrhosis of liver: Secondary | ICD-10-CM

## 2014-09-04 DIAGNOSIS — E349 Endocrine disorder, unspecified: Secondary | ICD-10-CM

## 2014-09-04 HISTORY — DX: Obstructive sleep apnea (adult) (pediatric): G47.33

## 2014-09-04 LAB — BASIC METABOLIC PANEL
BUN: 18 mg/dL (ref 6–23)
CO2: 25 meq/L (ref 19–32)
CREATININE: 1.15 mg/dL (ref 0.40–1.50)
Calcium: 9.6 mg/dL (ref 8.4–10.5)
Chloride: 105 mEq/L (ref 96–112)
GFR: 69.79 mL/min (ref >60.00)
GLUCOSE: 145 mg/dL — AB (ref 70–99)
Potassium: 4.4 mEq/L (ref 3.5–5.1)
Sodium: 137 mEq/L (ref 135–145)

## 2014-09-04 LAB — LIPID PANEL
CHOLESTEROL: 212 mg/dL — AB (ref 0–200)
HDL: 30.5 mg/dL — ABNORMAL LOW (ref >39.00)
NonHDL: 181.5
Total CHOL/HDL Ratio: 7
Triglycerides: 376 mg/dL — ABNORMAL HIGH (ref 0.0–149.0)
VLDL: 75.2 mg/dL — ABNORMAL HIGH (ref 0.0–40.0)

## 2014-09-04 LAB — HEMOGLOBIN A1C: Hgb A1c MFr Bld: 6.8 % — ABNORMAL HIGH (ref 4.6–6.5)

## 2014-09-04 LAB — AST: AST: 33 U/L (ref 0–37)

## 2014-09-04 LAB — LDL CHOLESTEROL, DIRECT: LDL DIRECT: 93 mg/dL

## 2014-09-04 LAB — ALT: ALT: 40 U/L (ref 0–53)

## 2014-09-04 LAB — HM DIABETES FOOT EXAM: HM Diabetic Foot Exam: POSITIVE

## 2014-09-04 MED ORDER — GABAPENTIN 300 MG PO CAPS: 300.0000 mg | ORAL_CAPSULE | Freq: Three times a day (TID) | ORAL | Status: DC

## 2014-09-04 NOTE — Assessment & Plan Note (Addendum)
Uncontrolled diabetes with neuropathy. Recently started on glimepiride, metformin dose increase. CBGs better, had a single episode of low blood sugar. Also, I received a fax from dermatology, triglycerides few days ago where 427.  Plan: A1c, BMP, LFTs, FLP Strongly consider Victoza to help wt loss (has OSA) Diet-exercise-consequences of DM (early death)  Discussed -- refer to nutritionist Declined pneumonia shot Increased gabapentin dose

## 2014-09-04 NOTE — Assessment & Plan Note (Signed)
Well controlled 

## 2014-09-04 NOTE — Telephone Encounter (Signed)
Okay to add free and total testosterone, DX hypogonadism

## 2014-09-04 NOTE — Assessment & Plan Note (Signed)
Wife requested labs, will do

## 2014-09-04 NOTE — Patient Instructions (Signed)
Get your blood work before you leave    Increase gabapentin 300 mg: 1 tablet twice a day for 2 weeks, then 1 tablet 3 times a day  Watch for low sugar symptoms, carrie glucose with you.   Come back to the office in 3 months   for a routine check up     Diabetes and Foot Care Diabetes may cause you to have problems because of poor blood supply (circulation) to your feet and legs. This may cause the skin on your feet to become thinner, break easier, and heal more slowly. Your skin may become dry, and the skin may peel and crack. You may also have nerve damage in your legs and feet causing decreased feeling in them. You may not notice minor injuries to your feet that could lead to infections or more serious problems. Taking care of your feet is one of the most important things you can do for yourself.  HOME CARE INSTRUCTIONS  Wear shoes at all times, even in the house. Do not go barefoot. Bare feet are easily injured.  Check your feet daily for blisters, cuts, and redness. If you cannot see the bottom of your feet, use a mirror or ask someone for help.  Wash your feet with warm water (do not use hot water) and mild soap. Then pat your feet and the areas between your toes until they are completely dry. Do not soak your feet as this can dry your skin.  Apply a moisturizing lotion or petroleum jelly (that does not contain alcohol and is unscented) to the skin on your feet and to dry, brittle toenails. Do not apply lotion between your toes.  Trim your toenails straight across. Do not dig under them or around the cuticle. File the edges of your nails with an emery board or nail file.  Do not cut corns or calluses or try to remove them with medicine.  Wear clean socks or stockings every day. Make sure they are not too tight. Do not wear knee-high stockings since they may decrease blood flow to your legs.  Wear shoes that fit properly and have enough cushioning. To break in new shoes, wear them for  just a few hours a day. This prevents you from injuring your feet. Always look in your shoes before you put them on to be sure there are no objects inside.  Do not cross your legs. This may decrease the blood flow to your feet.  If you find a minor scrape, cut, or break in the skin on your feet, keep it and the skin around it clean and dry. These areas may be cleansed with mild soap and water. Do not cleanse the area with peroxide, alcohol, or iodine.  When you remove an adhesive bandage, be sure not to damage the skin around it.  If you have a wound, look at it several times a day to make sure it is healing.  Do not use heating pads or hot water bottles. They may burn your skin. If you have lost feeling in your feet or legs, you may not know it is happening until it is too late.  Make sure your health care provider performs a complete foot exam at least annually or more often if you have foot problems. Report any cuts, sores, or bruises to your health care provider immediately. SEEK MEDICAL CARE IF:   You have an injury that is not healing.  You have cuts or breaks in the skin.  You have an ingrown nail.  You notice redness on your legs or feet.  You feel burning or tingling in your legs or feet.  You have pain or cramps in your legs and feet.  Your legs or feet are numb.  Your feet always feel cold. SEEK IMMEDIATE MEDICAL CARE IF:   There is increasing redness, swelling, or pain in or around a wound.  There is a red line that goes up your leg.  Pus is coming from a wound.  You develop a fever or as directed by your health care provider.  You notice a bad smell coming from an ulcer or wound. Document Released: 06/12/2000 Document Revised: 02/15/2013 Document Reviewed: 11/22/2012 [facility] Patient Information 2015 [place], [place]. This information is not intended to replace advice given to you by your health care provider. Make sure you discuss any questions you have with  your health care provider.

## 2014-09-04 NOTE — Telephone Encounter (Signed)
Add on request faxed to main lab.

## 2014-09-04 NOTE — Progress Notes (Signed)
Pre visit review using our clinic review tool, if applicable. No additional management support is needed unless otherwise documented below in the visit note. 

## 2014-09-04 NOTE — Assessment & Plan Note (Addendum)
Last LFTs, PT/PTT normal. Normal platelets Liver ultrasound 05-2014 as follows: IMPRESSION: 1. Echogenic inhomogeneous liver most consistent with fatty infiltration. The nodularity of the margins of the liver described on prior CT suggesting cirrhosis is not as well seen by ultrasound, but the prior CT does suggest early changes of cirrhosis. No focal abnormality is seen. 2. No gallstones. 3. Bowel gas obscures assessment of the pancreas and portions of the abdominal aorta.

## 2014-09-04 NOTE — Assessment & Plan Note (Signed)
History of chronic headaches, well controlled on Cymbalta

## 2014-09-04 NOTE — Telephone Encounter (Signed)
Spoke with [name], Pt's wife, she would like to request add on to check Pt's Testosterone.   Please advise

## 2014-09-04 NOTE — Progress Notes (Signed)
Subjective:    Patient ID: James [name], male    DOB: July 16, 1955, 59 y.o.   MRN: [id number]  DOS:  09/04/2014 Type of visit - description : f/u Interval history: Diabetes with neuropathy, gabapentin dose increased, not doing any better. DM medication was adjusted, ambulatory blood sugars around 119, one time CBG was 54. History of cirrhosis, workup reviewed. Complain of fatigue, patient reports he was diagnosed with sleep apnea 2012, intolerant to CPAP    Review of Systems Denies chest pain or difficulty breathing No nausea, vomiting, diarrhea. + Snoring.   Past Medical History  Diagnosis Date  . Hypertension   . Diabetes mellitus without complication     borderline per primary MD  . Stomach ulcer   . Acid reflux   . Neuropathy of both feet   . Kidney stone   . Post-traumatic hydrocephalus     s/p shunts x 2 (first got infected )  . Psoriasis     sees Dr [name]  . Eczema   . Diverticulitis 03/2013  . Testosterone deficiency 04/28/2011  . Insomnia 04/26/2013    Past Surgical History  Procedure Laterality Date  . Total hip arthroplasty Left 2011  . Shoulder surgery Left 2010  . Back surgery  1980  . Ventriculoperitoneal shunt  2007    x2    History   Social History  . Marital Status: Married    Spouse Name: [name]  . Number of Children: 2  . Years of Education: N/A   Occupational History  . disable     Social History Main Topics  . Smoking status: Never Smoker   . Smokeless tobacco: Never Used     Comment: Tried when younger.    . Alcohol Use: No  . Drug Use: No  . Sexual Activity: Not on file   Other Topics Concern  . Not on file   Social History Narrative   Household-- pt , wife, one children        Medication List       This list is accurate as of: 09/04/14  8:40 AM.  Always use your most recent med list.               acetaminophen 325 MG tablet  Commonly known as:  TYLENOL  Take 2 tablets (650 mg total) by mouth every 6 (six)  hours as needed.     acitretin 25 MG capsule  Commonly known as:  SORIATANE  Take 25 mg by mouth daily before breakfast.     ALEVE PO  Take 1 tablet by mouth every 12 (twelve) hours as needed (pain).     carvedilol 6.25 MG tablet  Commonly known as:  COREG  Take 12.5 mg by mouth daily.     COSENTYX [place]  Inject 300 mg into the skin. Every 4 weeks     COZAAR 100 MG tablet  Generic drug:  losartan  Take 1 tablet (100 mg total) by mouth daily.     DULoxetine 60 MG capsule  Commonly known as:  CYMBALTA  Take 120 mg by mouth daily.     gabapentin 100 MG capsule  Commonly known as:  NEURONTIN  Take 1 capsule (100 mg total) by mouth 3 (three) times daily.     glimepiride 4 MG tablet  Commonly known as:  AMARYL  Take 1 tablet (4 mg total) by mouth daily before breakfast.     glucose blood test strip  Check blood sugar twice daily  HYDROcodone-homatropine 5-1.5 MG/5ML syrup  Commonly known as:  HYCODAN  Take 5 mLs by mouth every 6 (six) hours as needed for cough.     metFORMIN 1000 MG tablet  Commonly known as:  GLUCOPHAGE  Take 1,000 mg by mouth 2 (two) times daily with a meal.     omeprazole 40 MG capsule  Commonly known as:  PRILOSEC  Take 40 mg by mouth daily.           Objective:   Physical Exam BP 132/74 mmHg  Pulse 81  Temp(Src) 98 F (36.7 C) (Oral)  Ht 5' 11"  (1.803 m)  Wt 264 lb 6 oz (119.92 kg)  BMI 36.89 kg/m2  SpO2 97%  General:   Well developed, well nourished . NAD.  HEENT:  Normocephalic . Face symmetric, atraumatic Lungs:  CTA B Normal respiratory effort, no intercostal retractions, no accessory muscle use. Heart: RRR,  no murmur.  Foot exam: No edema, good pedal pulses, decreased pinprick examination distally bilaterally. Nails are small and dystrophic Neurologic:  alert & oriented X3.  Speech normal, gait appropriate for age and unassisted Psych--  Cognition and judgment appear intact.  Cooperative with normal attention span and  concentration.  Behavior appropriate. No anxious or depressed appearing.       Assessment & Plan:

## 2014-09-05 LAB — TESTOSTERONE, FREE, TOTAL, SHBG
Sex Hormone Binding: 53 nmol/L (ref 22–77)
TESTOSTERONE: 217 ng/dL — AB (ref 300–890)
Testosterone, Free: 30.4 pg/mL — ABNORMAL LOW (ref 47.0–244.0)
Testosterone-% Free: 1.4 % — ABNORMAL LOW (ref 1.6–2.9)

## 2014-09-13 ENCOUNTER — Encounter: Payer: 59 | Attending: Internal Medicine | Admitting: *Deleted

## 2014-09-13 VITALS — Ht 71.0 in | Wt 262.5 lb

## 2014-09-13 DIAGNOSIS — Z713 Dietary counseling and surveillance: Secondary | ICD-10-CM | POA: Insufficient documentation

## 2014-09-13 DIAGNOSIS — E119 Type 2 diabetes mellitus without complications: Secondary | ICD-10-CM | POA: Diagnosis present
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
## 2014-09-13 NOTE — Patient Instructions (Signed)
Plan:  Aim for 3 Carb Choices per meal (45 grams) +/- 1 either way (at least 2 to help prevent low BG during the day) Aim for 0-1 Carbs per snack if hungry  Include protein in moderation with your meals and snacks Continue  with your activity level daily as tolerated Consider checking BG at alternate times per day as directed by MD  Continue taking medications as directed by MD

## 2014-09-30 ENCOUNTER — Encounter: Payer: Self-pay | Admitting: *Deleted

## 2014-10-10 ENCOUNTER — Other Ambulatory Visit (INDEPENDENT_AMBULATORY_CARE_PROVIDER_SITE_OTHER): Payer: 59

## 2014-10-10 DIAGNOSIS — E291 Testicular hypofunction: Secondary | ICD-10-CM | POA: Diagnosis not present

## 2014-10-10 LAB — FOLLICLE STIMULATING HORMONE: FSH: 4.4 m[IU]/mL (ref 1.4–18.1)

## 2014-10-10 LAB — LUTEINIZING HORMONE: LH: 2.75 m[IU]/mL (ref 1.50–9.30)

## 2014-10-11 LAB — PROLACTIN: Prolactin: 8.7 ng/mL (ref 2.1–17.1)

## 2014-10-12 ENCOUNTER — Other Ambulatory Visit: Payer: Self-pay | Admitting: *Deleted

## 2014-10-12 ENCOUNTER — Encounter: Payer: Self-pay | Admitting: *Deleted

## 2014-10-12 VITALS — BP 120/80 | Ht 71.0 in | Wt 261.6 lb

## 2014-10-12 DIAGNOSIS — G479 Sleep disorder, unspecified: Secondary | ICD-10-CM

## 2014-10-12 DIAGNOSIS — E118 Type 2 diabetes mellitus with unspecified complications: Secondary | ICD-10-CM

## 2014-10-12 LAB — POCT CBG (FASTING - GLUCOSE)-MANUAL ENTRY: Glucose Fasting, POC: 207 mg/dL — AB (ref 70–99)

## 2014-10-12 NOTE — Patient Outreach (Signed)
[facility]) Care Management   10/12/2014  James [name] 27-Sep-1955 [id number]  James [name] is an 59 y.o. male who presents for routine quarterly Link To Wellness follow up. Subjective:  States he is doing "the same". Denies significant behavior change to explain significantly lower A1C. States he is pleased with the results of the new psoriasis medicine he is taking.  Objective:   Review of Systems  Constitutional: Negative.     Physical Exam  Constitutional: He is oriented to person, place, and time. He appears well-developed and well-nourished. He is cooperative.  Neurological: He is alert and oriented to person, place, and time.  Skin:      Filed Weights   10/12/14 1003  Weight: 261 lb 9.6 oz (118.661 kg)   Filed Vitals:   10/12/14 1003  BP: 120/80    Current Medications:   Current Outpatient Prescriptions  Medication Sig Dispense Refill  . acetaminophen (TYLENOL) 325 MG tablet Take 2 tablets (650 mg total) by mouth every 6 (six) hours as needed.    Marland Kitchen acitretin (SORIATANE) 25 MG capsule Take 25 mg by mouth daily before breakfast.    . carvedilol (COREG) 6.25 MG tablet Take 12.5 mg by mouth daily.    . DULoxetine (CYMBALTA) 60 MG capsule Take 120 mg by mouth daily.    Marland Kitchen gabapentin (NEURONTIN) 300 MG capsule Take 1 capsule (300 mg total) by mouth 3 (three) times daily. 90 capsule 3  . glimepiride (AMARYL) 4 MG tablet Take 1 tablet (4 mg total) by mouth daily before breakfast. 30 tablet 3  . glucose blood test strip Check blood sugar twice daily 100 each 12  . losartan (COZAAR) 100 MG tablet Take 1 tablet (100 mg total) by mouth daily.    . metFORMIN (GLUCOPHAGE) 1000 MG tablet Take 1,000 mg by mouth 2 (two) times daily with a meal.    . Naproxen Sodium (ALEVE PO) Take 1 tablet by mouth every 12 (twelve) hours as needed (pain).    Marland Kitchen omeprazole (PRILOSEC) 40 MG capsule Take 40 mg by mouth daily.    . Secukinumab (COSENTYX [place]) Inject 300 mg into the  skin. Every 4 weeks    . HYDROcodone-homatropine (HYCODAN) 5-1.5 MG/5ML syrup Take 5 mLs by mouth every 6 (six) hours as needed for cough. (Patient not taking: Reported on 10/12/2014) 200 mL 0   No current facility-administered medications for this visit.    Functional Status:   In your present state of health, do you have any difficulty performing the following activities: 10/12/2014  Hearing? N  Vision? N  Difficulty concentrating or making decisions? N  Walking or climbing stairs? N  Dressing or bathing? N  Doing errands, shopping? N    Fall/Depression Screening:    PHQ 2/9 Scores 09/13/2014  PHQ - 2 Score 0   [facility] CM Care Plan        Patient Outreach from 10/12/2014 in [place] Problem One  Type II DM meeting A1C target as evidenced by A1C= 6.8 on 09/04/14   Care Plan for Problem One  Active   Interventions for Problem One Long Term Goal  using graphic reviewed basic pathopysiologic glucose metabolic defects in Type II DM, reviewed patient medications, discussed mechanism of action of Metformin and glimepiride, reminded patient to eat within 30 minutes of taking glimepiride to reduce risk of hypoglycemia,  reviewed lab work of 3/8 and discussed strategies to reduce  triglycerides, advised  patient that  Sor[place]ane  has increased triglycerides listed as a common side effect, reviewed notes and plan to improve food choices as outlined in patient's visit with RD on 3/17, advised patient that glucometer/strips will be changed to True Metrix at the op pharmacy when current  supplies are exhausted, reviewed likely cause of elevated fasting POC CBG as consumption of quart of fried rice, eggroll and ice cream at dinner last pm, reviewed date of upcoming appointment with Dr [name] on June 8 at 8:30 and reinforced the importance of keeping the appointment   [place] Term Goal (31-90 days)  Continued good glycemic control as evidenced by A1C<7.0% at next check by  primary care provider   [place] Term Goal Start Date  10/12/14      Assessment:   Link To Wellness member with Type II DM with improved glucemic control with addition of sulfonylurea Glimepiride in Dec 2015.  Plan:  RNCM will send today's note to Dr. [name] via [place]. RNCM will meet quarterly and as needed with patient per Link To Wellness program guidelines to assist with Type II DM self-management and assess patient's progress toward mutually set goals.   Barrington Ellison RN,CCM,CDE [place] Management Coordinator Office Phone [phone] Office Fax [phone]

## 2014-11-13 ENCOUNTER — Encounter: Payer: Self-pay | Admitting: *Deleted

## 2014-12-05 ENCOUNTER — Encounter: Payer: Self-pay | Admitting: Internal Medicine

## 2014-12-05 ENCOUNTER — Ambulatory Visit (INDEPENDENT_AMBULATORY_CARE_PROVIDER_SITE_OTHER): Payer: 59 | Admitting: Internal Medicine

## 2014-12-05 VITALS — BP 130/84 | HR 81 | Temp 97.6°F | Ht 71.0 in | Wt 258.0 lb

## 2014-12-05 DIAGNOSIS — Z79899 Other long term (current) drug therapy: Secondary | ICD-10-CM | POA: Diagnosis not present

## 2014-12-05 DIAGNOSIS — G629 Polyneuropathy, unspecified: Secondary | ICD-10-CM

## 2014-12-05 DIAGNOSIS — E291 Testicular hypofunction: Secondary | ICD-10-CM | POA: Diagnosis not present

## 2014-12-05 DIAGNOSIS — L409 Psoriasis, unspecified: Secondary | ICD-10-CM | POA: Diagnosis not present

## 2014-12-05 DIAGNOSIS — G4733 Obstructive sleep apnea (adult) (pediatric): Secondary | ICD-10-CM

## 2014-12-05 DIAGNOSIS — E119 Type 2 diabetes mellitus without complications: Secondary | ICD-10-CM

## 2014-12-05 DIAGNOSIS — E349 Endocrine disorder, unspecified: Secondary | ICD-10-CM

## 2014-12-05 LAB — VITAMIN B12: Vitamin B-12: 613 pg/mL (ref 211–911)

## 2014-12-05 LAB — FOLATE: FOLATE: 14 ng/mL (ref >5.9)

## 2014-12-05 LAB — HEMOGLOBIN A1C: Hgb A1c MFr Bld: 8.2 % — ABNORMAL HIGH (ref 4.6–6.5)

## 2014-12-05 MED ORDER — GLIMEPIRIDE 4 MG PO TABS: 4.0000 mg | ORAL_TABLET | Freq: Every day | ORAL | Status: DC

## 2014-12-05 NOTE — Assessment & Plan Note (Addendum)
Complaining  of fatigue and feeling sleepy, his CPAP equipment is very old, has been unable to get a new one. Plan: Refer to pulmonary

## 2014-12-05 NOTE — Assessment & Plan Note (Addendum)
Last free testosterone was slightly low, recheck. If continued to be only slightly low will probably not start HRT, would   Recommend to lose some weight and use a CPAP before further treatment

## 2014-12-05 NOTE — Progress Notes (Signed)
Pre visit review using our clinic review tool, if applicable. No additional management support is needed unless otherwise documented below in the visit note. 

## 2014-12-05 NOTE — Patient Instructions (Signed)
Get your blood work before you leave    Please see your eye doctor regularly

## 2014-12-05 NOTE — Assessment & Plan Note (Addendum)
Last A1c satisfactory, good compliance with medication, will check A1c.  diabetic foot exam  08-2014 showed neuropathy Eye  examined recommended strongly Continue current meds. --- Has moderate neuropathy, very bothersome to the patient, likely due to diabetes. Will check X27, folic acid, vitamin D, HIV and RPR to rule out other underlying pathologies

## 2014-12-05 NOTE — Progress Notes (Signed)
Subjective:    Patient ID: James [name], male    DOB: [birth year], 59 y.o.   MRN: [id number]  DOS:  12/05/2014 Type of visit - description : rov Interval history: Diabetes, good compliance of medication, ambulatory blood sugars range from 99-140. Neuropathy, ongoing problem described as numbness at the bottom of the feet and toes. Sleep apnea, not using CPAP, equipment is very old. Complain of severe fatigue and feeling sleepy Hypertension, good compliance with medication.    Review of Systems  Denies chest pain or difficulty breathing. No nausea or vomiting, had diarrhea temporarily this week. Symptoms resolved. No anxiety or depression. Has occasional headache and dizziness but symptoms are on and off, not new to him and at baseline.  Past Medical History  Diagnosis Date  . Hypertension   . Diabetes mellitus with neuropathy     borderline per primary MD  . Stomach ulcer   . Acid reflux   . Kidney stone   . Post-traumatic hydrocephalus     s/p shunts x 2 (first got infected )  . Psoriasis     sees Dr [name]  . Eczema   . Diverticulitis 03/2013  . Testosterone deficiency 04/28/2011  . Insomnia 04/26/2013  . Depression     on cymbalta  . Chronic headaches     on cymbalta  . Psoriatic arthritis     Past Surgical History  Procedure Laterality Date  . Total hip arthroplasty Left 2011  . Shoulder surgery Left 2010  . Back surgery  1980  . Ventriculoperitoneal shunt  2007    x2    History   Social History  . Marital Status: Married    Spouse Name: [name]  . Number of Children: 2  . Years of Education: N/A   Occupational History  . disable     Social History Main Topics  . Smoking status: Never Smoker   . Smokeless tobacco: Never Used     Comment: Tried when younger.    . Alcohol Use: No     Comment: patient states he cannot drink etoh because it causes him to flush   . Drug Use: No  . Sexual Activity: Not on file   Other Topics Concern  . Not on  file   Social History Narrative   Household-- pt , wife, one children        Medication List       This list is accurate as of: 12/05/14 11:59 PM.  Always use your most recent med list.               acetaminophen 325 MG tablet  Commonly known as:  TYLENOL  Take 2 tablets (650 mg total) by mouth every 6 (six) hours as needed.     acitretin 25 MG capsule  Commonly known as:  SORIATANE  Take 25 mg by mouth daily before breakfast.     ALEVE PO  Take 1 tablet by mouth every 12 (twelve) hours as needed (pain).     carvedilol 6.25 MG tablet  Commonly known as:  COREG  Take 12.5 mg by mouth daily.     COSENTYX [place]  Inject 300 mg into the skin. Every 4 weeks     COZAAR 100 MG tablet  Generic drug:  losartan  Take 1 tablet (100 mg total) by mouth daily.     DULoxetine 60 MG capsule  Commonly known as:  CYMBALTA  Take 120 mg by mouth daily.     gabapentin  300 MG capsule  Commonly known as:  NEURONTIN  Take 1 capsule (300 mg total) by mouth 3 (three) times daily.     glimepiride 4 MG tablet  Commonly known as:  AMARYL  Take 1 tablet (4 mg total) by mouth daily before breakfast.     glucose blood test strip  Check blood sugar twice daily     HYDROcodone-homatropine 5-1.5 MG/5ML syrup  Commonly known as:  HYCODAN  Take 5 mLs by mouth every 6 (six) hours as needed for cough.     metFORMIN 1000 MG tablet  Commonly known as:  GLUCOPHAGE  Take 1,000 mg by mouth 2 (two) times daily with a meal.     omeprazole 40 MG capsule  Commonly known as:  PRILOSEC  Take 40 mg by mouth daily.           Objective:   Physical Exam BP 130/84 mmHg  Pulse 81  Temp(Src) 97.6 F (36.4 C) (Oral)  Ht 5' 11"  (1.803 m)  Wt 258 lb (117.028 kg)  BMI 36.00 kg/m2  SpO2 95% General:   Well developed, well nourished . NAD.  HEENT:  Normocephalic . Face symmetric, atraumatic Lungs:  CTA B Normal respiratory effort, no intercostal retractions, no accessory muscle use. Heart: RRR,   no murmur.  no pretibial edema bilaterally  Skin: Not pale. Not jaundice Neurologic:  alert & oriented X3.  Speech normal, gait appropriate for age and unassisted Psych--  Cognition and judgment appear intact.  Cooperative with normal attention span and concentration.  Behavior appropriate. No anxious or depressed appearing.      Assessment & Plan:

## 2014-12-06 LAB — RPR

## 2014-12-06 LAB — HIV ANTIBODY (ROUTINE TESTING W REFLEX): HIV 1&2 Ab, 4th Generation: NONREACTIVE

## 2014-12-06 LAB — TESTOSTERONE, FREE, TOTAL, SHBG
Sex Hormone Binding: 47 nmol/L (ref 22–77)
Testosterone, Free: 54.3 pg/mL (ref 47.0–244.0)
Testosterone-% Free: 1.6 % (ref 1.6–2.9)
Testosterone: 342 ng/dL (ref 300–890)

## 2014-12-09 LAB — VITAMIN D 1,25 DIHYDROXY
Vitamin D 1, 25 (OH)2 Total: 27 pg/mL (ref 18–72)
Vitamin D2 1, 25 (OH)2: <8 pg/mL
Vitamin D3 1, 25 (OH)2: 27 pg/mL

## 2014-12-12 MED ORDER — SITAGLIPTIN PHOSPHATE 100 MG PO TABS: 100.0000 mg | ORAL_TABLET | Freq: Every day | ORAL | Status: DC

## 2014-12-12 NOTE — Addendum Note (Signed)
Addended by: Wilfrid Lund on: 12/12/2014 10:43 AM   Modules accepted: Orders

## 2015-01-28 ENCOUNTER — Encounter: Payer: Self-pay | Admitting: Neurology

## 2015-01-28 ENCOUNTER — Ambulatory Visit (INDEPENDENT_AMBULATORY_CARE_PROVIDER_SITE_OTHER): Payer: 59 | Admitting: Neurology

## 2015-01-28 DIAGNOSIS — G4710 Hypersomnia, unspecified: Secondary | ICD-10-CM | POA: Diagnosis not present

## 2015-01-28 DIAGNOSIS — E291 Testicular hypofunction: Secondary | ICD-10-CM | POA: Diagnosis not present

## 2015-01-28 DIAGNOSIS — R5382 Chronic fatigue, unspecified: Secondary | ICD-10-CM | POA: Diagnosis not present

## 2015-01-28 DIAGNOSIS — G4730 Sleep apnea, unspecified: Secondary | ICD-10-CM | POA: Diagnosis not present

## 2015-01-28 DIAGNOSIS — G911 Obstructive hydrocephalus: Secondary | ICD-10-CM | POA: Diagnosis not present

## 2015-01-28 DIAGNOSIS — E349 Endocrine disorder, unspecified: Secondary | ICD-10-CM

## 2015-01-28 HISTORY — DX: Hypersomnia, unspecified: G47.10

## 2015-01-28 HISTORY — DX: Chronic fatigue, unspecified: R53.82

## 2015-01-28 HISTORY — DX: Obstructive hydrocephalus: G91.1

## 2015-01-28 HISTORY — DX: Morbid (severe) obesity due to excess calories: E66.01
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
## 2015-01-28 NOTE — Patient Instructions (Signed)
Polysomnography (Sleep Studies) Polysomnography (PSG) is a series of tests used for detecting (diagnosing) obstructive sleep apnea and other sleep disorders. The tests measure how some parts of your body are working while you are sleeping. The tests are extensive and expensive. They are done in a sleep lab or hospital, and vary from center to center. Your caregiver may perform other more simple sleep studies and questionnaires before doing more complete and involved testing. Testing may not be covered by insurance. Some of these tests are:  An EEG (Electroencephalogram). This tests your brain waves and stages of sleep.  An EOG (Electrooculogram). This measures the movements of your eyes. It detects periods of REM (rapid eye movement) sleep, which is your dream sleep.  An EKG (Electrocardiogram). This measures your heart rhythm.  EMG (Electromyography). This is a measurement of how the muscles are working in your upper airway and your legs while sleeping.  An oximetry measurement. It measures how much oxygen (air) you are getting while sleeping.  Breathing efforts may be measured. The same test can be interpreted (understood) differently by different caregivers and centers that study sleep.  Studies may be given an apnea/hypopnea index (AHI). This is a number which is found by counting the times of no breathing or under breathing during the night, and relating those numbers to the amount of time spent in bed. When the AHI is greater than 15, the patient is likely to complain of daytime sleepiness. When the AHI is greater than 30, the patient is at increased risk for heart problems and must be followed more closely. Following the AHI also allows you to know how treatment is working. Simple oximetry (tracking the amount of oxygen that is taken in) can be used for screening patients who:  Do not have symptoms (problems) of OSA.  Have a normal Epworth Sleepiness Scale Score.  Have a low pre-test  probability of having OSA.  Have none of the upper airway problems likely to cause apnea.  Oximetry is also used to determine if treatment is effective in patients who showed significant desaturations (not getting enough oxygen) on their home sleep study. One extra measure of safety is to perform additional studies for the person who only snores. This is because no one can predict with absolute certainty who will have OSA. Those who show significant desaturations (not getting enough oxygen) are recommended to have a more detailed sleep study. Document Released: 12/20/2002 Document Revised: 09/07/2011 Document Reviewed: 08/21/2013 [facility] Patient Information 2015 [place], [place]. This information is not intended to replace advice given to you by your health care provider. Make sure you discuss any questions you have with your health care provider.

## 2015-02-06 ENCOUNTER — Telehealth: Payer: Self-pay

## 2015-02-11 ENCOUNTER — Other Ambulatory Visit: Payer: Self-pay

## 2015-02-11 DIAGNOSIS — I10 Essential (primary) hypertension: Secondary | ICD-10-CM

## 2015-02-11 MED ORDER — METFORMIN HCL 1000 MG PO TABS: 1000.0000 mg | ORAL_TABLET | Freq: Two times a day (BID) | ORAL | Status: DC

## 2015-02-11 MED ORDER — CARVEDILOL 6.25 MG PO TABS: 12.5000 mg | ORAL_TABLET | Freq: Every day | ORAL | Status: DC

## 2015-02-11 MED ORDER — LOSARTAN POTASSIUM 100 MG PO TABS: 100.0000 mg | ORAL_TABLET | Freq: Every day | ORAL | Status: DC

## 2015-02-13 ENCOUNTER — Other Ambulatory Visit: Payer: Self-pay | Admitting: *Deleted

## 2015-02-13 NOTE — Patient Outreach (Signed)
Left message for James [name] on his cell phone requesting he call and schedule Link To Wellness follow up appointment. He saw Dr. Larose [name] on 12/05/14 and his A1C was 8.2%, up from 6.8% on 09/04/14, so Januvia 100 mg was added. He was directed to call James [name], James [name] assistant, at [phone], to schedule a follow up appointment. James Ellison RN,CCM,CDE [place] Management Coordinator Link To Wellness Office Phone [phone] Office Fax [phone]

## 2015-02-14 ENCOUNTER — Telehealth: Payer: Self-pay

## 2015-02-19 ENCOUNTER — Other Ambulatory Visit: Payer: Self-pay | Admitting: Internal Medicine

## 2015-02-19 NOTE — Telephone Encounter (Signed)
Okay to refill for 6 months 

## 2015-02-19 NOTE — Telephone Encounter (Signed)
Pt is requesting refill on Gabapentin.  Last OV: 12/05/2014 Last Fill: 09/04/2014 #[id number]   Okay to refill?

## 2015-02-19 NOTE — Telephone Encounter (Signed)
Rx sent to pharmacy   

## 2015-03-11 ENCOUNTER — Ambulatory Visit (INDEPENDENT_AMBULATORY_CARE_PROVIDER_SITE_OTHER): Payer: 59 | Admitting: Neurology

## 2015-03-11 DIAGNOSIS — R5382 Chronic fatigue, unspecified: Secondary | ICD-10-CM

## 2015-03-11 DIAGNOSIS — G4733 Obstructive sleep apnea (adult) (pediatric): Secondary | ICD-10-CM | POA: Diagnosis not present

## 2015-03-11 DIAGNOSIS — E349 Endocrine disorder, unspecified: Secondary | ICD-10-CM

## 2015-03-11 DIAGNOSIS — G911 Obstructive hydrocephalus: Secondary | ICD-10-CM

## 2015-03-11 DIAGNOSIS — G4710 Hypersomnia, unspecified: Secondary | ICD-10-CM

## 2015-03-11 DIAGNOSIS — G4730 Sleep apnea, unspecified: Secondary | ICD-10-CM

## 2015-03-12 NOTE — Sleep Study (Signed)
Please see the scanned sleep study interpretation located in the Procedure tab within the Chart Review section. 

## 2015-03-14 ENCOUNTER — Other Ambulatory Visit: Payer: Self-pay | Admitting: *Deleted

## 2015-03-15 ENCOUNTER — Encounter: Payer: Self-pay | Admitting: *Deleted

## 2015-03-15 NOTE — Patient Outreach (Signed)
[facility]) Care Management   03/14/15  James [name] 1955-10-24 [id number]  James [name] is an 59 y.o. male who presents to the [place] office for routine Link To Wellness follow up for self management assistance with Type II DM.  Subjective:  [name] says his wife has been out of work for about a month due to mini strokes and she has some right sided weakness and is receiving physical therapy. He says he only checks his blood sugar when he doesn't feel well and does no exercise other than the physical exertion that goes into maintaining his [place]. He says he does not follow a CHO controlled meal plan and is not up to date on his eye exam even though he knows he should have one yearly. He says he saw Dr. [name] in June and another medication was added to his DM medication regimen. He says he takes all of his pills at one time, at night, because that is the only way he can remember to take them all.  Objective:   Review of Systems  Constitutional: Negative.     Physical Exam  Constitutional: He appears well-developed and well-nourished.  Skin: Skin is warm and dry.  Psychiatric: He has a normal mood and affect. His behavior is normal. Judgment and thought content normal.  . Filed Vitals:   03/14/15 0947  BP: 104/82   Filed Weights   03/14/15 0947  Weight: 264 lb 6.4 oz (119.931 kg)    Current Medications:   Current Outpatient Prescriptions  Medication Sig Dispense Refill  . Adalimumab (HUMIRA) 40 MG/0.8ML PSKT Inject into the skin.    . carvedilol (COREG) 6.25 MG tablet Take 2 tablets (12.5 mg total) by mouth daily. 60 tablet 2  . DULoxetine (CYMBALTA) 60 MG capsule Take 120 mg by mouth daily.    Marland Kitchen gabapentin (NEURONTIN) 300 MG capsule Take 1 capsule (300 mg total) by mouth 3 (three) times daily. 90 capsule 6  . glimepiride (AMARYL) 4 MG tablet Take 1 tablet (4 mg total) by mouth daily before breakfast. 30 tablet 6  . glucose blood test strip  Check blood sugar twice daily 100 each 12  . losartan (COZAAR) 100 MG tablet Take 1 tablet (100 mg total) by mouth daily. 30 tablet 2  . metFORMIN (GLUCOPHAGE) 1000 MG tablet Take 1 tablet (1,000 mg total) by mouth 2 (two) times daily with a meal. 60 tablet 2  . Naproxen Sodium (ALEVE PO) Take 1 tablet by mouth every 12 (twelve) hours as needed (pain).    Marland Kitchen omeprazole (PRILOSEC) 40 MG capsule Take 40 mg by mouth daily.    . sitaGLIPtin (JANUVIA) 100 MG tablet Take 1 tablet (100 mg total) by mouth daily. 30 tablet 6  . acitretin (SORIATANE) 25 MG capsule Take 25 mg by mouth daily before breakfast.    . Secukinumab (COSENTYX [place]) Inject 300 mg into the skin. Every 4 weeks     No current facility-administered medications for this visit.    Functional Status:   In your present state of health, do you have any difficulty performing the following activities: 03/14/2015 10/12/2014  Hearing? N N  Vision? N N  Difficulty concentrating or making decisions? N N  Walking or climbing stairs? N N  Dressing or bathing? N N  Doing errands, shopping? N N    Fall/Depression Screening:    PHQ 2/9 Scores 03/14/2015 01/28/2015 09/13/2014  PHQ - 2 Score 1 2 0  PHQ- 9 Score - 11 -    Assessment:   Spouse of [employer] employee and Link To Wellness member with Type II DM no longer meeting target A1C of <7.0% as evidenced by A1C= 8.2% on 12/05/14  Plan:  [place] Problem One        Most Recent Value   Care Plan Problem One  Type II DM not meeting A1C target as evidenced by A1C= 8.2% on 12/05/14   Role Documenting the Problem One  Care Management [place] for Problem One  Active   [facility] Long Term Goal (31-90 days)  Improved glycemic control as evidenced by A1C<7.0% at next check by primary care provider   [place] Term Goal Start Date  03/14/15   Interventions for Problem One Long Term Goal  discussed mechanism of action, dosage, dosing schedule and common side effects of Januvia, reviewed  the need for a combination of DM medications to correct the pathophysiologic core deficits of glucose metabolism and to prevent or slow beta cell failure, reinforced the importance of taking all of his DM medications since he is not ready to start an exercise program or follow a CHO controlled meal plan, arranged for Link To wellness follow up in January     Barrington Ellison RN,CCM,CDE [place] Management Coordinator [facility] To [facility]ness Office Phone [phone] Office Fax [phone]

## 2015-03-19 ENCOUNTER — Telehealth: Payer: Self-pay

## 2015-03-19 DIAGNOSIS — G4733 Obstructive sleep apnea (adult) (pediatric): Secondary | ICD-10-CM

## 2015-03-19 NOTE — Telephone Encounter (Signed)
error 

## 2015-03-19 NOTE — Telephone Encounter (Signed)
Called pt to discuss sleep study results. I advised him that severe osa was seen in his PSG and cpap therapy is advised. Pt is willing to proceed again with cpap. I advised pt that I would send his referral to [facility]. I advised him to wear the cpap at least four hours a night. Appt was made for pt on 11/8 at 11:00 for insurance requirements. Pt verbalized understanding.

## 2015-03-20 ENCOUNTER — Ambulatory Visit: Payer: Medicare Other | Admitting: *Deleted

## 2015-04-04 DIAGNOSIS — L409 Psoriasis, unspecified: Secondary | ICD-10-CM | POA: Diagnosis not present

## 2015-04-04 DIAGNOSIS — Z79899 Other long term (current) drug therapy: Secondary | ICD-10-CM | POA: Diagnosis not present

## 2015-04-04 LAB — HEPATIC FUNCTION PANEL
ALK PHOS: 70 U/L (ref 25–125)
ALT: 42 U/L — AB (ref 10–40)
AST: 32 U/L (ref 14–40)
Bilirubin, Direct: 0.1 mg/dL (ref 0.01–0.4)
Bilirubin, Total: 0.7 mg/dL

## 2015-04-04 LAB — BASIC METABOLIC PANEL
BUN: 28 mg/dL — AB (ref 4–21)
Creatinine: 1.4 mg/dL — AB (ref 0.6–1.3)
GLUCOSE: 143 mg/dL
POTASSIUM: 5.1 mmol/L (ref 3.4–5.3)
SODIUM: 136 mmol/L — AB (ref 137–147)

## 2015-04-08 ENCOUNTER — Ambulatory Visit (INDEPENDENT_AMBULATORY_CARE_PROVIDER_SITE_OTHER): Payer: 59 | Admitting: Internal Medicine

## 2015-04-08 ENCOUNTER — Encounter: Payer: Self-pay | Admitting: Internal Medicine

## 2015-04-08 VITALS — BP 118/74 | HR 73 | Temp 97.9°F | Ht 71.0 in | Wt 263.2 lb

## 2015-04-08 DIAGNOSIS — N529 Male erectile dysfunction, unspecified: Secondary | ICD-10-CM | POA: Diagnosis not present

## 2015-04-08 DIAGNOSIS — Z09 Encounter for follow-up examination after completed treatment for conditions other than malignant neoplasm: Secondary | ICD-10-CM

## 2015-04-08 DIAGNOSIS — R7989 Other specified abnormal findings of blood chemistry: Secondary | ICD-10-CM | POA: Diagnosis not present

## 2015-04-08 DIAGNOSIS — M71332 Other bursal cyst, left wrist: Secondary | ICD-10-CM

## 2015-04-08 DIAGNOSIS — Z0000 Encounter for general adult medical examination without abnormal findings: Secondary | ICD-10-CM | POA: Diagnosis not present

## 2015-04-08 DIAGNOSIS — E119 Type 2 diabetes mellitus without complications: Secondary | ICD-10-CM | POA: Diagnosis not present

## 2015-04-08 DIAGNOSIS — Z125 Encounter for screening for malignant neoplasm of prostate: Secondary | ICD-10-CM | POA: Diagnosis not present

## 2015-04-08 HISTORY — DX: Encounter for follow-up examination after completed treatment for conditions other than malignant neoplasm: Z09

## 2015-04-08 HISTORY — DX: Encounter for general adult medical examination without abnormal findings: Z00.00

## 2015-04-08 LAB — TSH: TSH: 1.62 u[IU]/mL (ref 0.35–4.50)

## 2015-04-08 LAB — BASIC METABOLIC PANEL
BUN: 19 mg/dL (ref 6–23)
CALCIUM: 10.3 mg/dL (ref 8.4–10.5)
CO2: 24 meq/L (ref 19–32)
CREATININE: 1.25 mg/dL (ref 0.40–1.50)
Chloride: 103 mEq/L (ref 96–112)
GFR: 63.25 mL/min (ref >60.00)
GLUCOSE: 153 mg/dL — AB (ref 70–99)
Potassium: 4.8 mEq/L (ref 3.5–5.1)
Sodium: 137 mEq/L (ref 135–145)

## 2015-04-08 LAB — LIPID PANEL
CHOL/HDL RATIO: 7
CHOLESTEROL: 270 mg/dL — AB (ref 0–200)
HDL: 36.3 mg/dL — AB (ref >39.00)
Triglycerides: 516 mg/dL — ABNORMAL HIGH (ref 0.0–149.0)

## 2015-04-08 LAB — LDL CHOLESTEROL, DIRECT: LDL DIRECT: 117 mg/dL

## 2015-04-08 LAB — AST: AST: 32 U/L (ref 0–37)

## 2015-04-08 LAB — PSA: PSA: 0.26 ng/mL (ref 0.10–4.00)

## 2015-04-08 LAB — ALT: ALT: 44 U/L (ref 0–53)

## 2015-04-08 LAB — HEMOGLOBIN A1C: HEMOGLOBIN A1C: 6.7 % — AB (ref 4.6–6.5)

## 2015-04-08 MED ORDER — SILDENAFIL CITRATE 20 MG PO TABS: 40.0000 mg | ORAL_TABLET | Freq: Every day | ORAL | Status: DC | PRN

## 2015-04-08 NOTE — Patient Instructions (Signed)
Get your blood work before you leave    Diabetes: Check your blood sugar  v  3-4 times a week  Check your blood sugar  at different times of the day  GOALS: Fasting before a meal 70- 130 2 hours after a meal less than 180 At bedtime 90-150 Call if consistently not at goal ---- Remember that you need your eyes checked at least once a year to be sure you don't have "retinopathy" Check your feet regularly    Next visit  for a routine checkup, no fasting, in 4 months    (30 minutes) Please schedule an appointment at the front desk

## 2015-04-08 NOTE — Assessment & Plan Note (Signed)
DM: Started Januvia d/t A1c of 8.2 on  11-2014. Check labs.He has seen a nutritionist recently.Encourage a healthier ifestyle. Hyperlipidemia:Labs  Sleep apnea: Started a trial with CPAP 2 weeks ago, risk of untreated sleep apnea discussed. ED: new problem. Trial with Viagra, compliance of medication side effects discussed. History of hypogonadism but normal testosterone recently

## 2015-04-08 NOTE — Assessment & Plan Note (Addendum)
Td 2014; declined the flu shot, pneumonia shot , zostavax --benefits discussed Colonoscopy 07-2013, Dr. Henrene Pastor, had a polyp, next per GI DRE normal today, check a PSA Diet and exercise discussed

## 2015-04-08 NOTE — Progress Notes (Signed)
Pre visit review using our clinic review tool, if applicable. No additional management support is needed unless otherwise documented below in the visit note. 

## 2015-04-08 NOTE — Progress Notes (Signed)
Subjective:    Patient ID: James [name], male    DOB: 1955-08-27, 59 y.o.   MRN: [id number]  DOS:  04/08/2015 Type of visit - description : CPX In addition to the CPX we discussed the following Left wrist noted a painful knot. Diabetes: Started Januvia a few months ago, CBGs ranged from 80-120. No apparent side effects. Reports ED  for many years, never been treated but interested in Viagra.   Review of Systems  Constitutional: No fever. No chills. No unexplained wt changes. No unusual sweats  HEENT: No dental problems, no ear discharge, no facial swelling, no voice changes. No eye discharge, no eye  redness , no  intolerance to light   Respiratory: No wheezing , no  difficulty breathing. No cough , no mucus production  Cardiovascular: No CP, no leg swelling , no  Palpitations  GI: no nausea, no vomiting, no diarrhea , no  abdominal pain.  No blood in the stools. No dysphagia, no odynophagia    Endocrine: No polyphagia, no polyuria , no polydipsia  GU: No dysuria, gross hematuria, difficulty urinating. No urinary urgency, no frequency.  Musculoskeletal: No joint swellings or unusual aches or pains  Skin: No change in the color of the skin, palor , no  Rash  Allergic, immunologic: No environmental allergies , no  food allergies  Neurological: No dizziness no  syncope. No headaches. No diplopia, no slurred, no slurred speech, no motor deficits, no facial  Numbness  Hematological: No enlarged lymph nodes, no easy bruising , no unusual bleedings  Psychiatry: No suicidal ideas, no hallucinations, no beavior problems, no confusion.  No unusual/severe anxiety, no depression  Past Medical History  Diagnosis Date  . Hypertension   . Diabetes mellitus with neuropathy ([place])     borderline per primary MD  . Stomach ulcer   . Acid reflux   . Kidney stone   . Post-traumatic hydrocephalus     s/p shunts x 2 (first got infected )  . Psoriasis     sees Dr [name]  . Eczema     . Diverticulitis 03/2013  . Testosterone deficiency 04/28/2011  . Insomnia 04/26/2013  . Depression     on cymbalta  . Chronic headaches     on cymbalta  . Psoriatic arthritis [facility])     Past Surgical History  Procedure Laterality Date  . Total hip arthroplasty Left 2011  . Shoulder surgery Left 2010  . Back surgery  1980  . Ventriculoperitoneal shunt  2007    x2    Social History   Social History  . Marital Status: Married    Spouse Name: [name]  . Number of Children: 2  . Years of Education: N/A   Occupational History  . disable     Social History Main Topics  . Smoking status: Never Smoker   . Smokeless tobacco: Never Used     Comment: Tried when younger.    . Alcohol Use: No     Comment: patient states he cannot drink etoh because it causes him to flush   . Drug Use: No  . Sexual Activity: Not on file   Other Topics Concern  . Not on file   Social History Narrative   Household-- pt , wife, one children         Family History  Problem Relation Age of Onset  . Lung cancer Father     alive, former smoker   . Heart disease Brother  MI age 59  . Diabetes Neg Hx   . Prostate cancer Neg Hx   . Colon cancer Neg Hx        Medication List       This list is accurate as of: 04/08/15  1:09 PM.  Always use your most recent med list.               ALEVE PO  Take 1 tablet by mouth every 12 (twelve) hours as needed (pain).     carvedilol 6.25 MG tablet  Commonly known as:  COREG  Take 2 tablets (12.5 mg total) by mouth daily.     DULoxetine 60 MG capsule  Commonly known as:  CYMBALTA  Take 120 mg by mouth daily.     gabapentin 300 MG capsule  Commonly known as:  NEURONTIN  Take 1 capsule (300 mg total) by mouth 3 (three) times daily.     glimepiride 4 MG tablet  Commonly known as:  AMARYL  Take 1 tablet (4 mg total) by mouth daily before breakfast.     glucose blood test strip  Check blood sugar twice daily     HUMIRA 40 MG/0.8ML Pskt   Generic drug:  Adalimumab  Inject into the skin.     losartan 100 MG tablet  Commonly known as:  COZAAR  Take 1 tablet (100 mg total) by mouth daily.     metFORMIN 1000 MG tablet  Commonly known as:  GLUCOPHAGE  Take 1 tablet (1,000 mg total) by mouth 2 (two) times daily with a meal.     omeprazole 40 MG capsule  Commonly known as:  PRILOSEC  Take 40 mg by mouth daily.     sildenafil 20 MG tablet  Commonly known as:  REVATIO  Take 2-3 tablets (40-60 mg total) by mouth daily as needed.     sitaGLIPtin 100 MG tablet  Commonly known as:  JANUVIA  Take 1 tablet (100 mg total) by mouth daily.           Objective:   Physical Exam BP 118/74 mmHg  Pulse 73  Temp(Src) 97.9 F (36.6 C) (Oral)  Ht 5' 11"  (1.803 m)  Wt 263 lb 4 oz (119.409 kg)  BMI 36.73 kg/m2  SpO2 97% General:   Well developed, well nourished . NAD.  Neck:  Normal carotid pulse  HEENT:  Normocephalic . Face symmetric, atraumatic Lungs:  CTA B Normal respiratory effort, no intercostal retractions, no accessory muscle use. Heart: RRR,  no murmur.  No pretibial edema bilaterally  Abdomen:  Not distended, soft, non-tender. No rebound or rigidity. No mass,organomegaly Rectal:  External abnormalities: none. Normal sphincter tone. No rectal masses or tenderness.  No stools found MSK: Dorsal aspect of the L wrist has a slightly tender, less than 1 cm fluctuant mass consistent with a cyst Prostate: Prostate gland firm and smooth, no enlargement, nodularity, tenderness, mass, asymmetry or induration.  Skin: Exposed areas without rash. Not pale. Not jaundice Neurologic:  alert & oriented X3.  Speech normal, gait appropriate for age and unassisted Strength symmetric and appropriate for age.  Psych: Cognition and judgment appear intact.  Cooperative with normal attention span and concentration.  Behavior appropriate. No anxious or depressed appearing.    Assessment & Plan:   Assessment > DM w/  neuropathy  HTN OSA ,   dx 2012, sleep study again 02-2015 Dr Dohmeier--> severe OSA, rx CPAP Depression, insomnia ----on Cymbalta Chronic headaches -----on Cymbalta Fatty liver per [place] 05-2014 (?  Of cirrhosis per CT 2014) MSK: on disability d/t back pain Disability: d/t back pain, HAs Hypogonadism  Dx 2012, normal T 11-2014 (on no RX) GI: GERD, diverticulitis 2014, h/o PUD Psoriasis, psoriatic arthritis -- on HUMIRA  Posttraumatic hydrocephalus s/p 2 shunts (first got infected) H/u urolithiasis +FH CAD brother MI age 52  Plan  DM: Started Januvia d/t A1c of 8.2 on  11-2014. Check labs.He has seen a nutritionist recently.Encourage a healthier ifestyle. Hyperlipidemia:Labs  Sleep apnea: Started a trial with CPAP 2 weeks ago, risk of untreated sleep apnea discussed. ED: new problem. Trial with Viagra, compliance of medication side effects discussed. History of hypogonadism but normal testosterone recently Left wrist cyst: Symptomatic, refer to orthopedic surgery

## 2015-04-11 MED ORDER — FENOFIBRATE 160 MG PO TABS: 160.0000 mg | ORAL_TABLET | Freq: Every day | ORAL | Status: DC

## 2015-04-11 NOTE — Addendum Note (Signed)
Addended by: Wilfrid Lund on: 04/11/2015 02:01 PM   Modules accepted: Orders

## 2015-04-12 ENCOUNTER — Encounter: Payer: Self-pay | Admitting: Internal Medicine

## 2015-04-12 ENCOUNTER — Ambulatory Visit (INDEPENDENT_AMBULATORY_CARE_PROVIDER_SITE_OTHER): Payer: 59 | Admitting: Internal Medicine

## 2015-04-12 ENCOUNTER — Ambulatory Visit (HOSPITAL_BASED_OUTPATIENT_CLINIC_OR_DEPARTMENT_OTHER)
Admission: RE | Admit: 2015-04-12 | Discharge: 2015-04-12 | Disposition: A | Discharge status: Home / Self Care | Payer: 59 | Source: Ambulatory Visit | Attending: Internal Medicine | Admitting: Internal Medicine

## 2015-04-12 VITALS — BP 116/74 | HR 72 | Temp 97.5°F | Ht 71.0 in | Wt 263.2 lb

## 2015-04-12 DIAGNOSIS — Z09 Encounter for follow-up examination after completed treatment for conditions other than malignant neoplasm: Secondary | ICD-10-CM

## 2015-04-12 DIAGNOSIS — R071 Chest pain on breathing: Secondary | ICD-10-CM | POA: Diagnosis not present

## 2015-04-12 DIAGNOSIS — R52 Pain, unspecified: Secondary | ICD-10-CM | POA: Insufficient documentation

## 2015-04-12 DIAGNOSIS — W19XXXA Unspecified fall, initial encounter: Secondary | ICD-10-CM | POA: Diagnosis not present

## 2015-04-12 DIAGNOSIS — R0781 Pleurodynia: Secondary | ICD-10-CM | POA: Diagnosis not present

## 2015-04-12 MED ORDER — OXYCODONE-ACETAMINOPHEN 5-325 MG PO TABS: 1.0000 | ORAL_TABLET | Freq: Three times a day (TID) | ORAL | Status: DC | PRN

## 2015-04-12 NOTE — Progress Notes (Signed)
Pre visit review using our clinic review tool, if applicable. No additional management support is needed unless otherwise documented below in the visit note. 

## 2015-04-12 NOTE — Patient Instructions (Addendum)
Get your XR at the first floor  Take  Aleve OTC with food as needed   Take oxycodone if the pain continue, watch for any type of intolerance or reaction such as constipation or a rash or sedation.  Call if no better in the next 2 or 3 weeks.

## 2015-04-12 NOTE — Progress Notes (Signed)
Subjective:    Patient ID: James [name], male    DOB: 1956-02-22, 59 y.o.   MRN: [id number]  DOS:  04/12/2015 Type of visit - description : Acute, here with his wife Interval history: Was working in his yard yesterday, had a fall, landing on the right side of the chest, having persistent pain since then, worse by moving, taking deep breaths. No taking any pain meds  Review of Systems Denies other injuries, no direct impact,on the head, no loss of consciousness. No abdominal pain, neck or back pain. No nausea or vomiting. No bruising or bleeding.  Past Medical History  Diagnosis Date  . Hypertension   . Diabetes mellitus with neuropathy ([place])     borderline per primary MD  . Stomach ulcer   . Acid reflux   . Kidney stone   . Post-traumatic hydrocephalus     s/p shunts x 2 (first got infected )  . Psoriasis     sees Dr [name]  . Eczema   . Diverticulitis 03/2013  . Testosterone deficiency 04/28/2011  . Insomnia 04/26/2013  . Depression     on cymbalta  . Chronic headaches     on cymbalta  . Psoriatic arthritis [facility])     Past Surgical History  Procedure Laterality Date  . Total hip arthroplasty Left 2011  . Shoulder surgery Left 2010  . Back surgery  1980  . Ventriculoperitoneal shunt  2007    x2    Social History   Social History  . Marital Status: Married    Spouse Name: [name]  . Number of Children: 2  . Years of Education: N/A   Occupational History  . disable     Social History Main Topics  . Smoking status: Never Smoker   . Smokeless tobacco: Never Used     Comment: Tried when younger.    . Alcohol Use: No     Comment: patient states he cannot drink etoh because it causes him to flush   . Drug Use: No  . Sexual Activity: Not on file   Other Topics Concern  . Not on file   Social History Narrative   Household-- pt , wife, one children                Medication List       This list is accurate as of: 04/12/15 11:59 PM.  Always  use your most recent med list.               ALEVE PO  Take 1 tablet by mouth every 12 (twelve) hours as needed (pain).     carvedilol 6.25 MG tablet  Commonly known as:  COREG  Take 2 tablets (12.5 mg total) by mouth daily.     DULoxetine 60 MG capsule  Commonly known as:  CYMBALTA  Take 120 mg by mouth daily.     fenofibrate 160 MG tablet  Take 1 tablet (160 mg total) by mouth daily.     gabapentin 300 MG capsule  Commonly known as:  NEURONTIN  Take 1 capsule (300 mg total) by mouth 3 (three) times daily.     glimepiride 4 MG tablet  Commonly known as:  AMARYL  Take 1 tablet (4 mg total) by mouth daily before breakfast.     glucose blood test strip  Check blood sugar twice daily     HUMIRA 40 MG/0.8ML Pskt  Generic drug:  Adalimumab  Inject into the skin.  losartan 100 MG tablet  Commonly known as:  COZAAR  Take 1 tablet (100 mg total) by mouth daily.     metFORMIN 1000 MG tablet  Commonly known as:  GLUCOPHAGE  Take 1 tablet (1,000 mg total) by mouth 2 (two) times daily with a meal.     omeprazole 40 MG capsule  Commonly known as:  PRILOSEC  Take 40 mg by mouth daily.     oxyCODONE-acetaminophen 5-325 MG tablet  Commonly known as:  ROXICET  Take 1 tablet by mouth every 8 (eight) hours as needed for severe pain.     sildenafil 20 MG tablet  Commonly known as:  REVATIO  Take 2-3 tablets (40-60 mg total) by mouth daily as needed.     sitaGLIPtin 100 MG tablet  Commonly known as:  JANUVIA  Take 1 tablet (100 mg total) by mouth daily.           Objective:   Physical Exam BP 116/74 mmHg  Pulse 72  Temp(Src) 97.5 F (36.4 C) (Oral)  Ht 5' 11"  (1.803 m)  Wt 263 lb 4 oz (119.409 kg)  BMI 36.73 kg/m2  SpO2 96% General:   Well developed, well nourished, + antalgic posture and movements.  Chest wall TTP without crackles at theright lateral anterior aspect of the chest. No bruising. HEENT:  Normocephalic . Face symmetric, atraumatic Lungs:  CTA  B Normal respiratory effort, no intercostal retractions, no accessory muscle use. Heart: RRR,  no murmur.  No pretibial edema bilaterally  Skin: Not pale. Not jaundice Neurologic:  alert & oriented X3.  Speech normal, gait appropriate for age and unassisted Psych--  Cognition and judgment appear intact.  Cooperative with normal attention span and concentration.  Behavior appropriate. No anxious or depressed appearing.       Assessment & Plan:   Assessment > DM w/ neuropathy  HTN OSA ,   dx 2012, sleep study again 02-2015 Dr Dohmeier--> severe OSA, rx CPAP Depression, insomnia ----on Cymbalta Chronic headaches -----on Cymbalta Fatty liver per [place] 05-2014 (? Of cirrhosis per CT 2014) MSK: on disability d/t back pain Disability: d/t back pain, HAs Hypogonadism  Dx 2012, normal T 11-2014 (on no RX) GI: GERD, diverticulitis 2014, h/o PUD Psoriasis, psoriatic arthritis -- on HUMIRA  Posttraumatic hydrocephalus s/p 2 shunts (first got infected) H/u urolithiasis +FH CAD brother MI age 18   Plan  Chest contusion:  X-ray to rule out a fracture. Aleve as needed which he tolerates well, GI precautions discussed OxyContin if pain persists, reports that has taken it previously without problems.see instructions

## 2015-04-14 NOTE — Assessment & Plan Note (Signed)
Chest contusion:  X-ray to rule out a fracture. Aleve as needed which he tolerates well, GI precautions discussed OxyContin if pain persists, reports that has taken it previously without problems.see instructions

## 2015-04-19 ENCOUNTER — Other Ambulatory Visit: Payer: Self-pay

## 2015-04-19 MED ORDER — DULOXETINE HCL 60 MG PO CPEP: 120.0000 mg | ORAL_CAPSULE | Freq: Every day | ORAL | Status: DC

## 2015-04-19 MED ORDER — OMEPRAZOLE 40 MG PO CPDR: 40.0000 mg | DELAYED_RELEASE_CAPSULE | Freq: Every day | ORAL | Status: DC

## 2015-04-22 ENCOUNTER — Encounter: Payer: Self-pay | Admitting: Internal Medicine

## 2015-04-24 ENCOUNTER — Other Ambulatory Visit: Payer: Self-pay | Admitting: Internal Medicine

## 2015-04-24 MED ORDER — NATEGLINIDE 60 MG PO TABS: 60.0000 mg | ORAL_TABLET | Freq: Every day | ORAL | Status: DC

## 2015-04-30 DIAGNOSIS — M19132 Post-traumatic osteoarthritis, left wrist: Secondary | ICD-10-CM

## 2015-04-30 HISTORY — DX: Post-traumatic osteoarthritis, left wrist: M19.132

## 2015-05-07 ENCOUNTER — Ambulatory Visit (INDEPENDENT_AMBULATORY_CARE_PROVIDER_SITE_OTHER): Payer: 59 | Admitting: Neurology

## 2015-05-07 ENCOUNTER — Encounter: Payer: Self-pay | Admitting: Neurology

## 2015-05-07 VITALS — BP 142/90 | HR 72 | Resp 20 | Ht 71.0 in | Wt 268.0 lb

## 2015-05-07 DIAGNOSIS — Z9889 Other specified postprocedural states: Secondary | ICD-10-CM | POA: Diagnosis not present

## 2015-05-07 DIAGNOSIS — G4733 Obstructive sleep apnea (adult) (pediatric): Secondary | ICD-10-CM

## 2015-05-07 DIAGNOSIS — Z9989 Dependence on other enabling machines and devices: Secondary | ICD-10-CM

## 2015-05-07 HISTORY — DX: Other specified postprocedural states: Z98.890

## 2015-05-07 MED ORDER — ZOLPIDEM TARTRATE 5 MG PO TABS: 5.0000 mg | ORAL_TABLET | Freq: Every evening | ORAL | Status: DC | PRN
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
## 2015-05-07 NOTE — Patient Instructions (Addendum)
CPAP and BIPAP Information CPAP and BIPAP are methods of helping you breathe with the use of air pressure. CPAP stands for "continuous positive airway pressure." BIPAP stands for "bi-level positive airway pressure." In both methods, air is blown into your air passages to help keep you breathing well. With CPAP, the amount of pressure stays the same while you breathe in and out. CPAP is most commonly used for obstructive sleep apnea. For obstructive sleep apnea, CPAP works by holding your airways open so that they do not collapse when your muscles relax during sleep. BIPAP is similar to CPAP except the amount of pressure is increased when you inhale. This helps you take larger breaths. Your health care provider will recommend whether CPAP or BIPAP would be more helpful for you.  WHY ARE CPAP AND BIPAP TREATMENTS USED? CPAP or BIPAP can be helpful if you have:   Sleep apnea.   Chronic obstructive pulmonary disease (COPD).   Diseases that weaken the muscles of the chest, including muscular dystrophy or neurological diseases such as amyotrophic lateral sclerosis (ALS).  Other problems that cause breathing to be weak, abnormal, or difficult.  HOW IS CPAP OR BIPAP ADMINISTERED? Both CPAP and BIPAP are provided by a small machine with a flexible plastic tube that attaches to a plastic mask. The mask fits on your face, and air is blown into your air passages through your nose or mouth. The amount of pressure that is used to blow the air into your air passages can be set on the machine. Your health care provider will determine the pressure setting that should be used based on your individual needs. WHEN SHOULD CPAP OR BIPAP BE USED? In most cases, the mask is worn only when sleeping. Generally, you will need to wear the mask throughout the night and during the daytime if you take a nap. In a few cases involving certain medical conditions, people also need to wear the mask at other times when they are awake.  Follow your health care provider's instructions for when to use the machine.  USING THE MASK  Because the mask needs to be snug, some people feel a trapped or closed-in feeling (claustrophobic) when first using the mask. You may need to get used to the mask gradually. To do this, you can first hold the mask loosely over your nose or mouth. Gradually apply the mask more snugly. You can also gradually increase the amount of time that you use the mask.  Masks are available in various types and sizes. Some fit over your mouth and nose, and some fit over just your nose. If your mask does not fit well, talk to your health care provider about getting a different one.  If you are using a nasal mask and you tend to breathe through your mouth, a chin strap may be applied to help keep your mouth closed.   The CPAP and BIPAP machines have alarms that may sound if the mask comes off or develops a leak.  If you have trouble with the mask, it is very important that you talk to your health care provider about finding a way to make the mask easier to tolerate. Do not stop using the mask. This could have a negative impact on your health. TIPS FOR USING THE MACHINE  Place your CPAP or BIPAP machine on a secure table or stand near an electrical outlet.   Know where the on-off switch is located on the machine.  Follow your health care provider's  instructions for how to set the pressure on your machine and when you should use it.  Do not eat or drink while the CPAP or BIPAP machine is on. Food or fluids could get pushed into your lungs by the pressure of the CPAP or BIPAP.  Do not smoke. Tobacco smoke residue can damage the machine.   For home use, CPAP and BIPAP machines can be rented or purchased through home health care companies. Many different brands of machines are available. Renting a machine before purchasing may help you find out which particular machine works well for you. SEEK IMMEDIATE MEDICAL CARE  IF:  You have redness or open areas around your nose or mouth where the mask fits.   You have trouble operating the CPAP or BIPAP machine.   You cannot tolerate wearing the CPAP or BIPAP mask.    This information is not intended to replace advice given to you by your health care provider. Make sure you discuss any questions you have with your health care provider.   Document Released: 03/13/2004 Document Revised: 07/06/2014 Document Reviewed: 01/12/2013 Elsevier Interactive Patient Education 2016 [place] with Np in 6 month and with CPAP compliance data. Zolpidem tablets What is this medicine? ZOLPIDEM (zole PI dem) is used to treat insomnia. This medicine helps you to fall asleep and sleep through the night. This medicine may be used for other purposes; ask your health care provider or pharmacist if you have questions. What should I tell my health care provider before I take this medicine? They need to know if you have any of these conditions: -depression -history of drug abuse or addiction -if you often drink alcohol -liver disease -lung or breathing disease -myasthenia gravis -sleep apnea -suicidal thoughts, plans, or attempt; a previous suicide attempt by you or a family member -an unusual or allergic reaction to zolpidem, other medicines, foods, dyes, or preservatives -pregnant or trying to get pregnant -breast-feeding How should I use this medicine? Take this medicine by mouth with a glass of water. Follow the directions on the prescription label. It is better to take this medicine on an empty stomach and only when you are ready for bed. Do not take your medicine more often than directed. If you have been taking this medicine for several weeks and suddenly stop taking it, you may get unpleasant withdrawal symptoms. Your doctor or health care professional may want to gradually reduce the dose. Do not stop taking this medicine on your own. Always follow your doctor  or health care professional's advice. A special MedGuide will be given to you by the pharmacist with each prescription and refill. Be sure to read this information carefully each time. Talk to your pediatrician regarding the use of this medicine in children. Special care may be needed. Overdosage: If you think you have taken too much of this medicine contact a poison control center or emergency room at once. NOTE: This medicine is only for you. Do not share this medicine with others. What if I miss a dose? This does not apply. This medicine should only be taken immediately before going to sleep. Do not take double or extra doses. What may interact with this medicine? -alcohol -antihistamines for allergy, cough and cold -certain medicines for anxiety or sleep -certain medicines for depression, like amitriptyline, fluoxetine, sertraline -certain medicines for fungal infections like ketoconazole and itraconazole -certain medicines for seizures like phenobarbital, primidone -ciprofloxacin -dietary supplements for sleep, like valerian or kava kava -general anesthetics  like halothane, isoflurane, methoxyflurane, propofol -local anesthetics like lidocaine, pramoxine, tetracaine -medicines that relax muscles for surgery -narcotic medicines for pain -phenothiazines like chlorpromazine, mesoridazine, prochlorperazine, thioridazine -rifampin This list may not describe all possible interactions. Give your health care provider a list of all the medicines, herbs, non-prescription drugs, or dietary supplements you use. Also tell them if you smoke, drink alcohol, or use illegal drugs. Some items may interact with your medicine. What should I watch for while using this medicine? Visit your doctor or health care professional for regular checks on your progress. Keep a regular sleep schedule by going to bed at about the same time each night. Avoid caffeine-containing drinks in the evening hours. When sleep  medicines are used every night for more than a few weeks, they may stop working. Talk to your doctor if you still have trouble sleeping. After taking this medicine for sleep, you may get up out of bed while not being fully awake and do an activity that you do not know you are doing. The next morning, you may have no memory of the event. Activities such as driving a car ("sleep-driving"), making and eating food, talking on the phone, sexual activity, and sleep-walking have been reported. Call your doctor right away if you find out you have done any of these activities. Do not take this medicine if you have used alcohol that evening or before bed or taken another medicine for sleep since your risk of doing these sleep-related activities will be increased. Wait for at least 8 hours after you take a dose before driving or doing other activities that require full mental alertness. Do not take this medicine unless you are able to stay in bed for a full night (7 to 8 hours) before you must be active again. You may have a decrease in mental alertness the day after use, even if you feel that you are fully awake. Tell your doctor if you will need to perform activities requiring full alertness, such as driving, the next day. Do not stand or sit up quickly after taking this medicine, especially if you are an older patient. This reduces the risk of dizzy or fainting spells. If you or your family notice any changes in your behavior, such as new or worsening depression, thoughts of harming yourself, anxiety, other unusual or disturbing thoughts, or memory loss, call your doctor right away. After you stop taking this medicine, you may have trouble falling asleep. This is called rebound insomnia. This problem usually goes away on its own after 1 or 2 nights. What side effects may I notice from receiving this medicine? Side effects that you should report to your doctor or health care professional as soon as possible: -allergic  reactions like skin rash, itching or hives, swelling of the face, lips, or tongue -breathing problems -changes in vision -confusion -depressed mood or other changes in moods or emotions -feeling faint or lightheaded, falls -hallucinations -loss of balance or coordination -loss of memory -restlessness, excitability, or feelings of anxiety or agitation -suicidal thoughts -unusual activities while asleep like driving, eating, making phone calls, or sexual activity Side effects that usually do not require medical attention (report to your doctor or health care professional if they continue or are bothersome): -dizziness -drowsiness the day after you take this medicine -headache This list may not describe all possible side effects. Call your doctor for medical advice about side effects. You may report side effects to FDA at 1-800-FDA-1088. Where should I keep my medicine?  Keep out of the reach of children. This medicine can be abused. Keep your medicine in a safe place to protect it from theft. Do not share this medicine with anyone. Selling or giving away this medicine is dangerous and against the law. This medicine may cause accidental overdose and death if taken by other adults, children, or pets. Mix any unused medicine with a substance like cat litter or coffee grounds. Then throw the medicine away in a sealed container like a sealed bag or a coffee can with a lid. Do not use the medicine after the expiration date. Store at room temperature between 20 and 25 degrees C (68 and 77 degrees F). NOTE: This sheet is a summary. It may not cover all possible information. If you have questions about this medicine, talk to your doctor, pharmacist, or health care provider.    2016, Elsevier/Gold Standard. (2015-02-18 17:53:29)

## 2015-05-10 DIAGNOSIS — M19032 Primary osteoarthritis, left wrist: Secondary | ICD-10-CM | POA: Diagnosis not present

## 2015-05-13 ENCOUNTER — Other Ambulatory Visit: Payer: Self-pay | Admitting: Internal Medicine

## 2015-06-07 DIAGNOSIS — M19032 Primary osteoarthritis, left wrist: Secondary | ICD-10-CM | POA: Diagnosis not present

## 2015-06-27 ENCOUNTER — Emergency Department (HOSPITAL_BASED_OUTPATIENT_CLINIC_OR_DEPARTMENT_OTHER): Payer: 59

## 2015-06-27 ENCOUNTER — Emergency Department (HOSPITAL_BASED_OUTPATIENT_CLINIC_OR_DEPARTMENT_OTHER)
Admission: EM | Admit: 2015-06-27 | Discharge: 2015-06-27 | Disposition: A | Discharge status: Home / Self Care | Payer: 59 | Attending: Emergency Medicine | Admitting: Emergency Medicine

## 2015-06-27 ENCOUNTER — Encounter (HOSPITAL_BASED_OUTPATIENT_CLINIC_OR_DEPARTMENT_OTHER): Payer: Self-pay | Admitting: *Deleted

## 2015-06-27 DIAGNOSIS — I10 Essential (primary) hypertension: Secondary | ICD-10-CM | POA: Diagnosis not present

## 2015-06-27 DIAGNOSIS — K529 Noninfective gastroenteritis and colitis, unspecified: Secondary | ICD-10-CM | POA: Diagnosis not present

## 2015-06-27 DIAGNOSIS — Z79899 Other long term (current) drug therapy: Secondary | ICD-10-CM | POA: Diagnosis not present

## 2015-06-27 DIAGNOSIS — Z872 Personal history of diseases of the skin and subcutaneous tissue: Secondary | ICD-10-CM | POA: Insufficient documentation

## 2015-06-27 DIAGNOSIS — G8929 Other chronic pain: Secondary | ICD-10-CM | POA: Diagnosis not present

## 2015-06-27 DIAGNOSIS — R1032 Left lower quadrant pain: Secondary | ICD-10-CM

## 2015-06-27 DIAGNOSIS — G4700 Insomnia, unspecified: Secondary | ICD-10-CM | POA: Insufficient documentation

## 2015-06-27 DIAGNOSIS — N179 Acute kidney failure, unspecified: Secondary | ICD-10-CM | POA: Diagnosis not present

## 2015-06-27 DIAGNOSIS — K219 Gastro-esophageal reflux disease without esophagitis: Secondary | ICD-10-CM | POA: Diagnosis not present

## 2015-06-27 DIAGNOSIS — R630 Anorexia: Secondary | ICD-10-CM | POA: Diagnosis not present

## 2015-06-27 DIAGNOSIS — F329 Major depressive disorder, single episode, unspecified: Secondary | ICD-10-CM | POA: Diagnosis not present

## 2015-06-27 DIAGNOSIS — Z87442 Personal history of urinary calculi: Secondary | ICD-10-CM | POA: Insufficient documentation

## 2015-06-27 DIAGNOSIS — R1084 Generalized abdominal pain: Secondary | ICD-10-CM | POA: Diagnosis present

## 2015-06-27 LAB — CBC WITH DIFFERENTIAL/PLATELET
BASOS PCT: 0 %
Basophils Absolute: 0 10*3/uL (ref 0.0–0.1)
EOS ABS: 0.3 10*3/uL (ref 0.0–0.7)
Eosinophils Relative: 3 %
HCT: 41.2 % (ref 39.0–52.0)
HEMOGLOBIN: 14.1 g/dL (ref 13.0–17.0)
Lymphocytes Relative: 31 %
Lymphs Abs: 2.7 10*3/uL (ref 0.7–4.0)
MCH: 30.5 pg (ref 26.0–34.0)
MCHC: 34.2 g/dL (ref 30.0–36.0)
MCV: 89.2 fL (ref 78.0–100.0)
Monocytes Absolute: 0.6 10*3/uL (ref 0.1–1.0)
Monocytes Relative: 7 %
NEUTROS PCT: 59 %
Neutro Abs: 5 10*3/uL (ref 1.7–7.7)
Platelets: 187 10*3/uL (ref 150–400)
RBC: 4.62 MIL/uL (ref 4.22–5.81)
RDW: 13.5 % (ref 11.5–15.5)
WBC: 8.5 10*3/uL (ref 4.0–10.5)

## 2015-06-27 LAB — COMPREHENSIVE METABOLIC PANEL
ALT: 39 U/L (ref 17–63)
ANION GAP: 7 (ref 5–15)
AST: 50 U/L — ABNORMAL HIGH (ref 15–41)
Albumin: 4.4 g/dL (ref 3.5–5.0)
Alkaline Phosphatase: 49 U/L (ref 38–126)
BUN: 29 mg/dL — ABNORMAL HIGH (ref 6–20)
CALCIUM: 9.6 mg/dL (ref 8.9–10.3)
CHLORIDE: 105 mmol/L (ref 101–111)
CO2: 24 mmol/L (ref 22–32)
CREATININE: 1.81 mg/dL — AB (ref 0.61–1.24)
GFR, EST AFRICAN AMERICAN: 46 mL/min — AB (ref >60)
GFR, EST NON AFRICAN AMERICAN: 40 mL/min — AB (ref >60)
Glucose, Bld: 108 mg/dL — ABNORMAL HIGH (ref 65–99)
Potassium: 4.4 mmol/L (ref 3.5–5.1)
Sodium: 136 mmol/L (ref 135–145)
Total Bilirubin: 1.1 mg/dL (ref 0.3–1.2)
Total Protein: 8.9 g/dL — ABNORMAL HIGH (ref 6.5–8.1)

## 2015-06-27 LAB — CBG MONITORING, ED: GLUCOSE-CAPILLARY: 130 mg/dL — AB (ref 65–99)

## 2015-06-27 LAB — URINALYSIS, ROUTINE W REFLEX MICROSCOPIC
Bilirubin Urine: NEGATIVE
Glucose, UA: NEGATIVE mg/dL
Hgb urine dipstick: NEGATIVE
Ketones, ur: NEGATIVE mg/dL
NITRITE: NEGATIVE
Protein, ur: NEGATIVE mg/dL
SPECIFIC GRAVITY, URINE: 1.021 (ref 1.005–1.030)
pH: 5.5 (ref 5.0–8.0)

## 2015-06-27 LAB — URINE MICROSCOPIC-ADD ON: RBC / HPF: NONE SEEN RBC/hpf (ref 0–5)

## 2015-06-27 MED ORDER — IOHEXOL 300 MG/ML  SOLN
50.0000 mL | Freq: Once | INTRAMUSCULAR | Status: AC | PRN
  Administered 2015-06-27: 50 mL via ORAL

## 2015-06-27 MED ORDER — ONDANSETRON HCL 4 MG/2ML IJ SOLN
4.0000 mg | Freq: Once | INTRAMUSCULAR | Status: AC
  Administered 2015-06-27: 4 mg via INTRAVENOUS
  Filled 2015-06-27: qty 2

## 2015-06-27 MED ORDER — SODIUM CHLORIDE 0.9 % IV BOLUS (SEPSIS)
1000.0000 mL | Freq: Once | INTRAVENOUS | Status: AC
  Administered 2015-06-27: 1000 mL via INTRAVENOUS

## 2015-06-27 MED ORDER — ONDANSETRON HCL 4 MG PO TABS: 4.0000 mg | ORAL_TABLET | Freq: Four times a day (QID) | ORAL | Status: DC

## 2015-06-27 NOTE — ED Provider Notes (Signed)
CSN: [id number]     Arrival date & time 06/27/15  1538 History  By signing my name below, I, Meriel Pica, attest that this documentation has been prepared under the direction and in the presence of Quintella Reichert, MD. Electronically Signed: Meriel Pica, ED Scribe. 06/27/2015. 6:47 PM.   Chief Complaint  Patient presents with  . Abdominal Pain   The history is provided by the patient. No language interpreter was used.   HPI Comments: James [name] is a 59 y.o. male, with a PMhx of HTN, DM, peptic ulcer, GERD, Cdiff, diverticulitis, and hydrocephalus s/p VP shunt who presents to the Emergency Department complaining of constant, moderate, dull and burning pain to generalized left abdomen X 3 days. He associates diarrhea onset 2 days ago that has since resolved, nausea, and a decrease in fluid and food intake secondary to nausea. Pt also states he has been experiencing hot and cold flashes but has not taken his temperature at home. He is afebrile on triage vitals. Per wife, the pt has a history of hydrocephalus s/p VP shunt placed 19 years ago. He also has a PMhx of diverticulitis for which he was admitted to the hospital for 2 years ago. Pt notes his pain is similar to the pain he experienced with diverticulitis, however it is not currently as severe. The pt is on daily Humira for psoriasis. Denies vomiting, a h/o seizure disorder or frequent EtOH consumption. No antibiotic use in the past month.    Past Medical History  Diagnosis Date  . Hypertension   . Diabetes mellitus with neuropathy ([place])     borderline per primary MD  . Stomach ulcer   . Acid reflux   . Kidney stone   . Post-traumatic hydrocephalus     s/p shunts x 2 (first got infected )  . Psoriasis     sees Dr [name]  . Eczema   . Diverticulitis 03/2013  . Testosterone deficiency 04/28/2011  . Insomnia 04/26/2013  . Depression     on cymbalta  . Chronic headaches     on cymbalta  . Psoriatic arthritis ([place])   .  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 Total hip arthroplasty Left 2011  . Shoulder surgery Left 2010  . Back surgery  1980  . Ventriculoperitoneal shunt  2007    x2   Family History  Problem Relation Age of Onset  . Lung cancer Father     alive, former smoker   . Heart disease Brother     MI age 57  . Diabetes Neg Hx   . Prostate cancer Neg Hx   . Colon cancer Neg Hx    Social History  Substance Use Topics  . Smoking status: Never Smoker   . Smokeless tobacco: Never Used     Comment: Tried when younger.    . Alcohol Use: No     Comment: patient states he cannot drink etoh because it causes him to flush     Review of Systems  Constitutional: Positive for fever, chills and appetite change.  Gastrointestinal: Positive for nausea, abdominal pain and diarrhea. Negative for vomiting.  All other systems reviewed and are negative.  Allergies  Morphine and related; Sulfa drugs cross reactors; and Hydrocodone-homatropine  Home Medications   Prior to Admission medications   Medication Sig Start Date End Date Taking? Authorizing Provider  Adalimumab (HUMIRA) 40 MG/0.8ML PSKT Inject  into the skin.    Historical Provider, MD  carvedilol (COREG) 6.25 MG tablet Take 1 tablet (6.25 mg total) by mouth 2 (two) times daily with a meal. 05/13/15   Colon Branch, MD  DULoxetine (CYMBALTA) 60 MG capsule Take 2 capsules (120 mg total) by mouth daily. 04/19/15   Colon Branch, MD  fenofibrate 160 MG tablet Take 1 tablet (160 mg total) by mouth daily. 04/11/15   Colon Branch, MD  gabapentin (NEURONTIN) 300 MG capsule Take 1 capsule (300 mg total) by mouth 3 (three) times daily. 02/19/15   Colon Branch, MD  glimepiride (AMARYL) 4 MG tablet Take 1 tablet (4 mg total) by mouth daily before breakfast. 12/05/14   Colon Branch, MD  glucose blood test strip Check blood sugar twice daily 07/16/14   Colon Branch, MD  losartan  (COZAAR) 100 MG tablet Take 1 tablet (100 mg total) by mouth daily. 05/13/15   Colon Branch, MD  metFORMIN (GLUCOPHAGE) 1000 MG tablet Take 1 tablet (1,000 mg total) by mouth 2 (two) times daily with a meal. 05/13/15   Colon Branch, MD  omeprazole (PRILOSEC) 40 MG capsule Take 1 capsule (40 mg total) by mouth daily. 04/19/15   Colon Branch, MD  ondansetron (ZOFRAN) 4 MG tablet Take 1 tablet (4 mg total) by mouth every 6 (six) hours. 06/27/15   Quintella Reichert, MD  oxyCODONE-acetaminophen (ROXICET) 5-325 MG tablet Take 1 tablet by mouth every 8 (eight) hours as needed for severe pain. 04/12/15   Colon Branch, MD  sildenafil (REVATIO) 20 MG tablet Take 2-3 tablets (40-60 mg total) by mouth daily as needed. 04/08/15   Colon Branch, MD  sitaGLIPtin (JANUVIA) 100 MG tablet Take 1 tablet (100 mg total) by mouth daily. 12/12/14   Colon Branch, MD  zolpidem (AMBIEN) 5 MG tablet Take 1 tablet (5 mg total) by mouth at bedtime as needed for sleep. 05/07/15   Carmen Dohmeier, MD   BP 140/86 mmHg  Pulse 94  Temp(Src) 98.4 F (36.9 C) (Oral)  Resp 17  Ht 5' 11"  (1.803 m)  Wt 260 lb (117.935 kg)  BMI 36.28 kg/m2  SpO2 96% Physical Exam  Constitutional: He is oriented to person, place, and time. He appears well-developed and well-nourished.  HENT:  Head: Normocephalic and atraumatic.  Cardiovascular: Normal rate and regular rhythm.   No murmur heard. Pulmonary/Chest: Effort normal and breath sounds normal. No respiratory distress.  Abdominal: Soft. There is tenderness in the left lower quadrant. There is no rebound and no guarding.  Mild LLQ tenderness.   Musculoskeletal: He exhibits no edema or tenderness.  Neurological: He is alert and oriented to person, place, and time.  Skin: Skin is warm and dry.  Psychiatric: He has a normal mood and affect. His behavior is normal.  Nursing note and vitals reviewed.   ED Course  Procedures  DIAGNOSTIC STUDIES: Oxygen Saturation is 96% on RA, adequate by my  interpretation.    COORDINATION OF CARE: 6:42 PM Discussed treatment plan which includes to order IV fluids and antiemetics. Will order CT abdomen pelvis and diagnostic labs. Pt acknowledges and agrees to plan.   Labs Review Labs Reviewed  COMPREHENSIVE METABOLIC PANEL - Abnormal; Notable for the following:    Glucose, Bld 108 (*)    BUN 29 (*)    Creatinine, Ser 1.81 (*)    Total Protein 8.9 (*)    AST 50 (*)    GFR  calc non Af Amer 40 (*)    GFR calc Af Amer 46 (*)    All other components within normal limits  URINALYSIS, ROUTINE W REFLEX MICROSCOPIC (NOT AT [facility]) - Abnormal; Notable for the following:    Leukocytes, UA SMALL (*)    All other components within normal limits  URINE MICROSCOPIC-ADD ON - Abnormal; Notable for the following:    Squamous Epithelial / LPF 0-5 (*)    Bacteria, UA RARE (*)    All other components within normal limits  CBG MONITORING, ED - Abnormal; Notable for the following:    Glucose-Capillary 130 (*)    All other components within normal limits  URINE CULTURE  CBC WITH DIFFERENTIAL/PLATELET    Imaging Review Ct Abdomen Pelvis Wo Contrast  06/27/2015  CLINICAL DATA:  59 year old male with left lower quadrant abdominal pain, and diarrhea. EXAM: CT ABDOMEN AND PELVIS WITHOUT CONTRAST TECHNIQUE: Multidetector CT imaging of the abdomen and pelvis was performed following the standard protocol without IV contrast. COMPARISON:  Abdominal ultrasound dated 06/07/2014 and CT dated 04/25/2013 FINDINGS: Evaluation of this exam is limited in the absence of intravenous contrast. The visualized lung bases are clear. No intra-abdominal free air or free fluid. Cirrhosis with diffuse hepatic steatosis. There is a 3.2 x 2.1 cm nodular soft tissue density adjacent to the tail of the pancreas (series 2, image 32) which appears similar to the study dated 04/25/2013. This may represent a portion of the pancreatic tissue. A pancreatic lesion is less likely but not excluded. MRI  may provide better evaluation. The pancreas is otherwise unremarkable. There is no pancreatic atrophy or dilatation of the pancreatic duct. No peripancreatic stranding. The gallbladder, spleen, adrenal glands, kidneys, visualized ureters, and urinary bladder appear unremarkable. The prostate and seminal vesicles are grossly unremarkable. There is sigmoid diverticulosis with muscular hypertrophy. No definite active inflammatory changes identified. There is apparent diffuse thickening of the colon, likely related to underdistention. No pericolonic stranding noted. There is mild apparent thickening of the jejunal folds. Clinical correlation is recommended to evaluate for enteritis. No evidence of bowel obstruction. Normal appendix. The abdominal aorta and IVC appear grossly unremarkable on this noncontrast study. No portal venous gas identified. There is no adenopathy. There is a circumaortic left renal vein. Small fat containing umbilical hernia. A VP shunt catheter is partially visualized with tip in the anterior pelvis. There multilevel degenerative changes of the spine. There is a total left hip arthroplasty. No acute fracture. IMPRESSION: Sigmoid diverticulosis without evidence of active inflammation. Mild thickening of the jejunal folds. Clinical correlation is recommended to evaluate for enteritis. No bowel obstruction. Normal appendix. Cirrhosis. Stable appearing nodular density adjacent to the tail of the pancreas similar to the study dated 2014 likely representing pancreatic tissue. A pancreatic lesion is less likely. CT with contrast or MRI may provide better evaluation. Electronically Signed   By: Anner Crete M.D.   On: 06/27/2015 21:45   I have personally reviewed and evaluated these images and lab results as part of my medical decision-making.   MDM   Final diagnoses:  Left lower quadrant pain  Enteritis  Acute kidney injury [facility]   Patient here for evaluation of abdominal pain, recently  had diarrhea but this is now resolved. BMP demonstrates acute on chronic kidney injury. Provided IV fluids in the emergency department. CT scan with no evidence of diverticulitis. There is some evidence of enteritis, his diarrhea has resolved, do not feel acute treatment is indicated at this time. Discussed  with patient importance of oral fluid hydration, PCP follow-up as well as GI follow-up and repeat BMP to recheck his renal function. Return precautions were discussed. He has been tolerating oral fluids without difficulty in the emergency department.  I personally performed the services described in this documentation, which was scribed in my presence. The recorded information has been reviewed and is accurate.    Quintella Reichert, MD 06/28/15 [phone]

## 2015-06-27 NOTE — Discharge Instructions (Signed)
Your creatinine was elevated today.  Drink plenty of fluids. Do not take any ibuprofen or Aleve. Get your kidney function (BMP) rechecked by your family doctor. Get rechecked in the emergency department if you develop any significant abdominal pain, vomiting, or new concerning symptoms.   Abdominal Pain, Adult Many things can cause abdominal pain. Usually, abdominal pain is not caused by a disease and will improve without treatment. It can often be observed and treated at home. Your health care provider will do a physical exam and possibly order blood tests and X-rays to help determine the seriousness of your pain. However, in many cases, more time must pass before a clear cause of the pain can be found. Before that point, your health care provider may not know if you need more testing or further treatment. HOME CARE INSTRUCTIONS Monitor your abdominal pain for any changes. The following actions may help to alleviate any discomfort you are experiencing:  Only take over-the-counter or prescription medicines as directed by your health care provider.  Do not take laxatives unless directed to do so by your health care provider.  Try a clear liquid diet (broth, tea, or water) as directed by your health care provider. Slowly move to a bland diet as tolerated. SEEK MEDICAL CARE IF:  You have unexplained abdominal pain.  You have abdominal pain associated with nausea or diarrhea.  You have pain when you urinate or have a bowel movement.  You experience abdominal pain that wakes you in the night.  You have abdominal pain that is worsened or improved by eating food.  You have abdominal pain that is worsened with eating fatty foods.  You have a fever. SEEK IMMEDIATE MEDICAL CARE IF:  Your pain does not go away within 2 hours.  You keep throwing up (vomiting).  Your pain is felt only in portions of the abdomen, such as the right side or the left lower portion of the abdomen.  You pass bloody  or black tarry stools. MAKE SURE YOU:  Understand these instructions.  Will watch your condition.  Will get help right away if you are not doing well or get worse.   This information is not intended to replace advice given to you by your health care provider. Make sure you discuss any questions you have with your health care provider.   Document Released: 03/25/2005 Document Revised: 03/06/2015 Document Reviewed: 02/22/2013 Elsevier Interactive Patient Education [employer].

## 2015-06-27 NOTE — ED Notes (Signed)
Pt. Reports abd. Pain since Monday.  Pt. Reports a few bouts of diarrhea  With more severe diarrhea on Monday and Tuesday.  No vomiting per Pt.

## 2015-06-28 ENCOUNTER — Telehealth: Payer: Self-pay | Admitting: Internal Medicine

## 2015-06-28 ENCOUNTER — Other Ambulatory Visit (INDEPENDENT_AMBULATORY_CARE_PROVIDER_SITE_OTHER): Payer: 59

## 2015-06-28 DIAGNOSIS — E860 Dehydration: Secondary | ICD-10-CM

## 2015-06-28 LAB — COMPREHENSIVE METABOLIC PANEL
ALT: 36 U/L (ref 0–53)
AST: 44 U/L — AB (ref 0–37)
Albumin: 4.2 g/dL (ref 3.5–5.2)
Alkaline Phosphatase: 47 U/L (ref 39–117)
BILIRUBIN TOTAL: 1 mg/dL (ref 0.2–1.2)
BUN: 24 mg/dL — AB (ref 6–23)
CALCIUM: 9.9 mg/dL (ref 8.4–10.5)
CO2: 25 meq/L (ref 19–32)
CREATININE: 1.76 mg/dL — AB (ref 0.40–1.50)
Chloride: 102 mEq/L (ref 96–112)
GFR: 42.58 mL/min — ABNORMAL LOW (ref >60.00)
GLUCOSE: 135 mg/dL — AB (ref 70–99)
Potassium: 4.2 mEq/L (ref 3.5–5.1)
SODIUM: 135 meq/L (ref 135–145)
Total Protein: 8.5 g/dL — ABNORMAL HIGH (ref 6.0–8.3)

## 2015-06-28 NOTE — Telephone Encounter (Signed)
ER records reviewed: "Patient here for evaluation of abdominal pain, recently had diarrhea but this is now resolved. BMP demonstrates acute on chronic kidney injury. Provided IV fluids in the emergency department. CT scan with no evidence of diverticulitis. There is some evidence of enteritis, his diarrhea has resolved, do not feel acute treatment is indicated at this time. Discussed with patient importance of oral fluid hydration, PCP follow-up as well as GI follow-up and repeat BMP to recheck his renal function. Return precautions were discussed. He has been tolerating oral fluids without difficulty in the emergency department. " Advise patient: 1. Unfortunately we don't have an appointment available today but I like to see him. Please arrange something for next week 2. Come back now for a CMP DX dehydration 3. Drink plenty of fluids, if he continued to have symptoms or is feeling poorly needs to go to urgent care or ER.

## 2015-06-28 NOTE — Telephone Encounter (Signed)
Pt's wife informed, CMP ordered STAT.

## 2015-06-28 NOTE — Telephone Encounter (Signed)
patient wife called in stating that pt was seen at the [facility] ED last night and was told that patient had cirrhosis of the liver. She states that the ED dr told them that this has been going on since 2014. Patient wife is wanting to talk to a nurse regarding this.

## 2015-06-28 NOTE — Telephone Encounter (Signed)
Please look at [facility] and other surrounding [place]'s. He must be seen somewhere today or he will need to go back to ED or Urgent Care.

## 2015-06-28 NOTE — Telephone Encounter (Signed)
Relation to IZ:XYOF Call back number:[phone]  Reason for call:  Spouse called and stated patient was seen in the [facility] ED and advised to follow up with PCP today, advised no availability and spouse stated the ED states its important patient has labs done today (reference ED telephone note) requesting orders.

## 2015-06-28 NOTE — Telephone Encounter (Signed)
Patient is scheduled for Dr. Henrene Pastor for 09/02/15 10:15

## 2015-06-28 NOTE — Telephone Encounter (Signed)
Pt will need to be seen by another provider in office or at another [facility] location.

## 2015-06-28 NOTE — Telephone Encounter (Signed)
No available slots anywhere, please advise

## 2015-06-29 LAB — URINE CULTURE

## 2015-07-02 ENCOUNTER — Ambulatory Visit (INDEPENDENT_AMBULATORY_CARE_PROVIDER_SITE_OTHER): Payer: 59 | Admitting: Internal Medicine

## 2015-07-02 ENCOUNTER — Encounter: Payer: Self-pay | Admitting: Internal Medicine

## 2015-07-02 VITALS — BP 136/82 | HR 83 | Temp 98.2°F | Ht 71.0 in | Wt 267.1 lb

## 2015-07-02 DIAGNOSIS — E1142 Type 2 diabetes mellitus with diabetic polyneuropathy: Secondary | ICD-10-CM

## 2015-07-02 DIAGNOSIS — I10 Essential (primary) hypertension: Secondary | ICD-10-CM

## 2015-07-02 LAB — COMPREHENSIVE METABOLIC PANEL
ALT: 38 U/L (ref 0–53)
AST: 30 U/L (ref 0–37)
Albumin: 4.1 g/dL (ref 3.5–5.2)
Alkaline Phosphatase: 63 U/L (ref 39–117)
BILIRUBIN TOTAL: 0.6 mg/dL (ref 0.2–1.2)
BUN: 24 mg/dL — ABNORMAL HIGH (ref 6–23)
CALCIUM: 10 mg/dL (ref 8.4–10.5)
CHLORIDE: 103 meq/L (ref 96–112)
CO2: 24 meq/L (ref 19–32)
Creatinine, Ser: 1.76 mg/dL — ABNORMAL HIGH (ref 0.40–1.50)
GFR: 42.58 mL/min — AB (ref >60.00)
GLUCOSE: 192 mg/dL — AB (ref 70–99)
POTASSIUM: 4.3 meq/L (ref 3.5–5.1)
Sodium: 135 mEq/L (ref 135–145)
Total Protein: 8.2 g/dL (ref 6.0–8.3)

## 2015-07-02 MED ORDER — CARVEDILOL 12.5 MG PO TABS: 12.5000 mg | ORAL_TABLET | Freq: Two times a day (BID) | ORAL | Status: DC

## 2015-07-02 MED FILL — HUMIRA PEN 40 MG/0.8ML PNKT: 40 | 30 days supply | Qty: 2 | Fill #2

## 2015-07-02 NOTE — Progress Notes (Signed)
Pre visit review using our clinic review tool, if applicable. No additional management support is needed unless otherwise documented below in the visit note. 

## 2015-07-02 NOTE — Patient Instructions (Addendum)
BEFORE YOU LEAVE THE OFFICE:  GO TO THE LAB  Get the blood work      AFTER YOU LEAVE THE OFFICE:  Go back on your routine med   Check the  blood pressure 2  weekly  Be sure your blood pressure is between 110/65 and  145/85. If it is consistently higher or lower, let me know   IF YOU EVER GET DEHYDRATED: STOP METFORMIN, LOSARTAN!

## 2015-07-02 NOTE — Progress Notes (Signed)
Subjective:    Patient ID: James [name], male    DOB: 1956-03-23, 60 y.o.   MRN: [id number]  DOS:  07/02/2015 Type of visit - description : ER follow-up Interval history:  Went to the ER 06/27/2015 with abdominal pain, diarrhea, nausea and decreased intake by mouth. CT abdomen showing enteritis, creatinine was increased, CBC normal. Subsequently labs were done, creatinine decreased to 1.76, still above baseline.  Review of Systems Based on the last CMP the patient was recommended to hold metformin, losartan. he did temporarily, but on 06/29/2015 notice his blood sugar to be elevated and he had a headache. BP was 170/130. He decided to go back on those medications. Currently feeling well No abdominal pain, good by mouth tolerance. No nausea, vomiting, diarrhea.   Past Medical History  Diagnosis Date  . Hypertension   . Diabetes mellitus with neuropathy ([place])     borderline per primary MD  . Stomach ulcer   . Acid reflux   . Kidney stone   . Post-traumatic hydrocephalus     s/p shunts x 2 (first got infected )  . Psoriasis     sees Dr [name]  . Eczema   . Diverticulitis 03/2013  . Testosterone deficiency 04/28/2011  . Insomnia 04/26/2013  . Depression     on cymbalta  . Chronic headaches     on cymbalta  . Psoriatic arthritis ([place])   . 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 Total hip arthroplasty Left 2011  . Shoulder surgery Left 2010  . Back surgery  1980  . Ventriculoperitoneal shunt  2007    x2    Social History   Social History  . Marital Status: Married    Spouse Name: [name]  . Number of Children: 2  . Years of Education: N/A   Occupational History  . disable     Social History Main Topics  . Smoking status: Never Smoker   . Smokeless tobacco: Never Used     Comment: Tried when younger.    . Alcohol Use: No     Comment: patient states he cannot drink  etoh because it causes him to flush   . Drug Use: No  . Sexual Activity: Not on file   Other Topics Concern  . Not on file   Social History Narrative   Household-- pt , wife, one children            Medication List       This list is accurate as of: 07/02/15  5:59 PM.  Always use your most recent med list.               carvedilol 6.25 MG tablet  Commonly known as:  COREG  Take 1 tablet (6.25 mg total) by mouth 2 (two) times daily with a meal.     DULoxetine 60 MG capsule  Commonly known as:  CYMBALTA  Take 2 capsules (120 mg total) by mouth daily.     fenofibrate 160 MG tablet  Take 1 tablet (160 mg total) by mouth daily.     gabapentin 300 MG capsule  Commonly known as:  NEURONTIN  Take 1 capsule (300 mg total) by mouth 3 (three) times daily.     glimepiride 4 MG tablet  Commonly known as:  AMARYL  Take 1 tablet (4 mg total) by mouth daily before breakfast.  glucose blood test strip  Check blood sugar twice daily     HUMIRA 40 MG/0.8ML Pskt  Generic drug:  Adalimumab  Inject into the skin.     losartan 100 MG tablet  Commonly known as:  COZAAR  Take 1 tablet (100 mg total) by mouth daily.     metFORMIN 1000 MG tablet  Commonly known as:  GLUCOPHAGE  Take 1 tablet (1,000 mg total) by mouth 2 (two) times daily with a meal.     omeprazole 40 MG capsule  Commonly known as:  PRILOSEC  Take 1 capsule (40 mg total) by mouth daily.     ondansetron 4 MG tablet  Commonly known as:  ZOFRAN  Take 1 tablet (4 mg total) by mouth every 6 (six) hours.     oxyCODONE-acetaminophen 5-325 MG tablet  Commonly known as:  ROXICET  Take 1 tablet by mouth every 8 (eight) hours as needed for severe pain.     sildenafil 20 MG tablet  Commonly known as:  REVATIO  Take 2-3 tablets (40-60 mg total) by mouth daily as needed.     sitaGLIPtin 100 MG tablet  Commonly known as:  JANUVIA  Take 1 tablet (100 mg total) by mouth daily.     zolpidem 5 MG tablet  Commonly  known as:  AMBIEN  Take 1 tablet (5 mg total) by mouth at bedtime as needed for sleep.           Objective:   Physical Exam BP 136/82 mmHg  Pulse 83  Temp(Src) 98.2 F (36.8 C) (Oral)  Ht 5' 11"  (1.803 m)  Wt 267 lb 2 oz (121.167 kg)  BMI 37.27 kg/m2  SpO2 97% General:   Well developed, well nourished . NAD.  HEENT:  Normocephalic . Face symmetric, atraumatic Lungs:  CTA B Normal respiratory effort, no intercostal retractions, no accessory muscle use. Heart: RRR,  no murmur.  no pretibial edema bilaterally  Abdomen:  Not distended, soft, non-tender. No rebound or rigidity. No mass,organomegaly Skin: Not pale. Not jaundice Neurologic:  alert & oriented X3.  Speech normal, gait appropriate for age and unassisted Psych--  Cognition and judgment appear intact.  Cooperative with normal attention span and concentration.  Behavior appropriate. No anxious or depressed appearing.    Assessment & Plan:   Assessment > DM w/ neuropathy  HTN Hyperlipidemia: Started fenofibrate 04/09/2015 OSA ,   dx 2012, sleep study again 02-2015 Dr Dohmeier--> severe OSA, rx CPAP Depression, insomnia ----on Cymbalta Chronic headaches -----on Cymbalta Fatty liver per [place] 05-2014 (? Of cirrhosis per CT 2014) MSK: on disability d/t back pain Disability: d/t back pain, HAs Hypogonadism  Dx 2012, normal T 11-2014 (on no RX) GI: GERD, diverticulitis 2014, h/o PUD Psoriasis, psoriatic arthritis -- on HUMIRA  Posttraumatic hydrocephalus s/p 2 shunts (first got infected) H/u urolithiasis +FH CAD brother MI age 27   Plan  Acute gastroenteritis: Resolved, it was complicated by increased creatinine. HTN:  temporarily held losartan due to increased creatinine,  check a CMP  DM: Held temporarily metformin, back on it. Check labs  Addendum:  Creatinine continue to be elevated: d/c  metformin, decrease losartan to 50 mg daily, increase carvedilol to 12.5 twice a day. Call if BP or blood sugar very  high. We will repeat her labs when he comes back. Discussed with the patient, he  verbalize understanding. If not better will need nephrology referral RTC 07-2014 as schedule

## 2015-07-03 MED FILL — CARVEDILOL 12.5 MG TABLET: 12.5 | 30 days supply | Qty: 60 | Fill #0

## 2015-07-19 ENCOUNTER — Other Ambulatory Visit: Payer: Self-pay | Admitting: Internal Medicine

## 2015-07-19 MED FILL — DULoxetine HCL 60 MG CPEP: 60 | 30 days supply | Qty: 60 | Fill #3

## 2015-07-19 MED FILL — FENOFIBRATE 160 MG TABLET: 160 | 30 days supply | Qty: 30 | Fill #3

## 2015-07-19 MED FILL — NATEGLINIDE 60 MG TABLET: 60 | 30 days supply | Qty: 30 | Fill #3

## 2015-07-19 MED FILL — JANUVIA 100 MG TABLET: 100 | 30 days supply | Qty: 30 | Fill #0

## 2015-07-19 MED FILL — OMEPRAZOLE DR 40 MG CAPSULE: 40 | 30 days supply | Qty: 30 | Fill #3

## 2015-07-25 ENCOUNTER — Encounter: Payer: Self-pay | Admitting: *Deleted

## 2015-07-25 ENCOUNTER — Encounter: Payer: Self-pay | Admitting: Internal Medicine

## 2015-07-25 ENCOUNTER — Other Ambulatory Visit: Payer: Self-pay | Admitting: *Deleted

## 2015-07-25 NOTE — Patient Outreach (Signed)
[facility]) Care Management   07/25/2015  James [name] 1956-02-07 [id number]  James [name] is an 60 y.o. male who presents to the [place] [facility] office for routine Link To Wellness follow up for self management assistance with Type II DM, HTN and hyperlipidemia.  Subjective:  James [name] says he went to the ED in late December (12/29) for a "stomach infection" and was found to have cirrhosis, fatty liver disease and acute on chronic kidney disease. He was told to stop taking his Metformin and to drink plenty of fluids since he was severely dehydrated when he was seen in the ED.Marland Kitchen He says he checked his fasting blood sugar this morning and it was 275 so he wants to start back on the Metformin. He says he is scheduled to see a specialist for his liver disease and may have to see a nephrologist if his kidney function doesn't improve. He says his psoriasis is much better since starting Humira. He also says she saw a neurologist for his sleep apnea and is currently working on getting an appliance that will enable him to adhere to CPAP therapy. He says he and his wife attended a bariatric surgery information session as she wishes to pursue weight loss surgery. James [name] also says he is in the midst of having teeth pulled and will eventually get partial dentures.  Objective:   Review of Systems  Constitutional: Negative.     Physical Exam  Constitutional: He appears well-developed and well-nourished.  Respiratory: Effort normal.  Skin: Skin is warm and dry.  Psychiatric: He has a normal mood and affect. His behavior is normal. Judgment and thought content normal.  Psoriatic lesions significantly improved with only mild redness and dryness on his elbows.  Filed Weights   07/25/15 0955  Weight: 266 lb 3.2 oz (120.748 kg)   Filed Vitals:   07/25/15 0955  BP: 108/72   Current Medications:   Current Outpatient Prescriptions  Medication Sig  Dispense Refill  . Adalimumab (HUMIRA) 40 MG/0.8ML PSKT Inject into the skin.    . carvedilol (COREG) 12.5 MG tablet Take 1 tablet (12.5 mg total) by mouth 2 (two) times daily with a meal. 60 tablet 3  . DULoxetine (CYMBALTA) 60 MG capsule Take 2 capsules (120 mg total) by mouth daily. 60 capsule 6  . fenofibrate 160 MG tablet Take 1 tablet (160 mg total) by mouth daily. 30 tablet 5  . gabapentin (NEURONTIN) 300 MG capsule Take 1 capsule (300 mg total) by mouth 3 (three) times daily. 90 capsule 6  . glimepiride (AMARYL) 4 MG tablet Take 1 tablet (4 mg total) by mouth daily before breakfast. 30 tablet 6  . losartan (COZAAR) 100 MG tablet Take 50 mg by mouth daily.    Marland Kitchen omeprazole (PRILOSEC) 40 MG capsule Take 1 capsule (40 mg total) by mouth daily. 30 capsule 6  . sildenafil (REVATIO) 20 MG tablet Take 2-3 tablets (40-60 mg total) by mouth daily as needed. 30 tablet 1  . sitaGLIPtin (JANUVIA) 100 MG tablet Take 1 tablet (100 mg total) by mouth daily. 30 tablet 3  . glucose blood test strip Check blood sugar twice daily (Patient not taking: Reported on 07/02/2015) 100 each 12  . ondansetron (ZOFRAN) 4 MG tablet Take 1 tablet (4 mg total) by mouth every 6 (six) hours. (Patient not taking: Reported on 07/02/2015) 12 tablet 0  . oxyCODONE-acetaminophen (ROXICET) 5-325 MG tablet Take 1 tablet by mouth every 8 (eight)  hours as needed for severe pain. (Patient not taking: Reported on 07/02/2015) 20 tablet 0  . zolpidem (AMBIEN) 5 MG tablet Take 1 tablet (5 mg total) by mouth at bedtime as needed for sleep. (Patient not taking: Reported on 07/25/2015) 30 tablet 0   No current facility-administered medications for this visit.    Functional Status:   In your present state of health, do you have any difficulty performing the following activities: 04/08/2015 03/14/2015  Hearing? N N  Vision? N N  Difficulty concentrating or making decisions? N N  Walking or climbing stairs? N N  Dressing or bathing? N N  Doing  errands, shopping? N N    Fall/Depression Screening:    PHQ 2/9 Scores 05/07/2015 04/08/2015 03/14/2015 01/28/2015 09/13/2014  PHQ - 2 Score 0 0 1 2 0  PHQ- 9 Score - - - 11 -    Assessment:   Spouse of [employer] employee and Link To Wellness member with Type II DM, HTN and hyperlipidemia. Meeting treatment target for DM (Hgb A1C= 6.7% on 04/08/15)  and HTN but with abnormal lipid profile, now on fenofibrate.  Plan:  [facility] CM Care Plan Problem One        Most Recent Value   Care Plan Problem One  Type II DM now meeting Hgb A1C target as evidenced by Hgb A1C= 6.7% on 04/08/15, meeting treatment targets for BP but abnormal lipid profile with elevated triglycerides and total cholesterol on 04/08/15   Role Documenting the Problem One  Care Management [place] for Problem One  Active   [facility] Long Term Goal (31-90 days)  Ongoing good glycemic control as evidenced by Hgb A1C<7.0%, improved lipid profile and ongoing BP readings of <140/<90 at next assessment    [facility] Long Term Goal Start Date  07/25/15   [facility] Long Term Goal Met Date     Interventions for Problem One Long Term Goal  reviewed medications, reviewed results of labs drawn 12/30 and reason for addition of fenofibrate to his medicine regimen, discussed mechanism of action of fenofibrate, discussed contraindications for resuming Metformin (decreased GFR and elevated creatinine) and advised him to call Dr. [name] to schedule labs before resuming Metformin to ensure his creatinine and GFR have improved, encouraged him to continue to work at finding a CPAP appliance that he can consistently use, discussed reasons for the significant improvement in his Hgb A1C most likely due to the addition of Januvia in June 2016, reviewed upcoming appointments with Dr. [name], Dr. Henrene Pastor and his neurologist, arranged for Link To Wellness follow up in April      RNCM to fax today's office visit note to Dr. [name]. RNCM will meet quarterly and as needed with  patient per Link To Wellness program guidelines to assist with Type II DM, HTN and hyperlipidemia self-management and assess patient's progress toward mutually set goals. Barrington Ellison RN,CCM,CDE [place] [facility] Coordinator Link To Wellness Office Phone [phone] Office Fax [phone]

## 2015-07-28 DIAGNOSIS — G4733 Obstructive sleep apnea (adult) (pediatric): Secondary | ICD-10-CM | POA: Diagnosis not present

## 2015-07-29 MED FILL — GABAPENTIN 300 MG CAPSULE: 300 | 30 days supply | Qty: 90 | Fill #5

## 2015-08-02 DIAGNOSIS — M19032 Primary osteoarthritis, left wrist: Secondary | ICD-10-CM | POA: Diagnosis not present

## 2015-08-05 DIAGNOSIS — S96912A Strain of unspecified muscle and tendon at ankle and foot level, left foot, initial encounter: Secondary | ICD-10-CM | POA: Diagnosis not present

## 2015-08-05 DIAGNOSIS — M25572 Pain in left ankle and joints of left foot: Secondary | ICD-10-CM | POA: Diagnosis not present

## 2015-08-07 ENCOUNTER — Encounter: Payer: Self-pay | Admitting: Internal Medicine

## 2015-08-07 ENCOUNTER — Ambulatory Visit (INDEPENDENT_AMBULATORY_CARE_PROVIDER_SITE_OTHER): Payer: 59 | Admitting: Internal Medicine

## 2015-08-07 VITALS — BP 114/76 | HR 71 | Temp 98.1°F | Ht 71.0 in | Wt 263.1 lb

## 2015-08-07 DIAGNOSIS — I10 Essential (primary) hypertension: Secondary | ICD-10-CM | POA: Diagnosis not present

## 2015-08-07 DIAGNOSIS — E119 Type 2 diabetes mellitus without complications: Secondary | ICD-10-CM

## 2015-08-07 LAB — BASIC METABOLIC PANEL
BUN: 27 mg/dL — ABNORMAL HIGH (ref 6–23)
CO2: 27 mEq/L (ref 19–32)
Calcium: 10.3 mg/dL (ref 8.4–10.5)
Chloride: 99 mEq/L (ref 96–112)
Creatinine, Ser: 2.06 mg/dL — ABNORMAL HIGH (ref 0.40–1.50)
GFR: 35.5 mL/min — AB (ref >60.00)
Glucose, Bld: 465 mg/dL — ABNORMAL HIGH (ref 70–99)
POTASSIUM: 5 meq/L (ref 3.5–5.1)
SODIUM: 133 meq/L — AB (ref 135–145)

## 2015-08-07 LAB — HEMOGLOBIN A1C: HEMOGLOBIN A1C: 10.1 % — AB (ref 4.6–6.5)

## 2015-08-07 MED FILL — CARVEDILOL 12.5 MG TABLET: 12.5 | 90 days supply | Qty: 180 | Fill #1

## 2015-08-07 NOTE — Progress Notes (Signed)
Pre visit review using our clinic review tool, if applicable. No additional management support is needed unless otherwise documented below in the visit note. 

## 2015-08-07 NOTE — Progress Notes (Signed)
Subjective:    Patient ID: James [name], male    DOB: 1955-12-01, 60 y.o.   MRN: [id number]  DOS:  08/07/2015 Type of visit - description : Follow-up from previous visit Interval history: Medications were adjusted, good compliance weight plan. Ambulatory BPs in the 120s. CBGs are not check consistently but sometimes has been as high as 275.    Review of Systems No further GI symptoms, no nausea, vomiting, diarrhea. Appetite is normal  Past Medical History  Diagnosis Date  . Hypertension   . Diabetes mellitus with neuropathy ([place])     borderline per primary MD  . Stomach ulcer   . Acid reflux   . Kidney stone   . Post-traumatic hydrocephalus     s/p shunts x 2 (first got infected )  . Psoriasis     sees Dr [name]  . Eczema   . Diverticulitis 03/2013  . Testosterone deficiency 04/28/2011  . Insomnia 04/26/2013  . Depression     on cymbalta  . Chronic headaches     on cymbalta  . Psoriatic arthritis ([place])   . 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 Total hip arthroplasty Left 2011  . Shoulder surgery Left 2010  . Back surgery  1980  . Ventriculoperitoneal shunt  2007    x2    Social History   Social History  . Marital Status: Married    Spouse Name: [name]  . Number of Children: 2  . Years of Education: N/A   Occupational History  . disable     Social History Main Topics  . Smoking status: Never Smoker   . Smokeless tobacco: Never Used     Comment: Tried when younger.    . Alcohol Use: No     Comment: patient states he cannot drink etoh because it causes him to flush   . Drug Use: No  . Sexual Activity: Not on file   Other Topics Concern  . Not on file   Social History Narrative   Household-- pt , wife, one children            Medication List       This list is accurate as of: 08/07/15  5:07 PM.  Always use your most recent med list.             carvedilol 12.5 MG tablet  Commonly known as:  COREG  Take 1 tablet (12.5 mg total) by mouth 2 (two) times daily with a meal.     DULoxetine 60 MG capsule  Commonly known as:  CYMBALTA  Take 2 capsules (120 mg total) by mouth daily.     fenofibrate 160 MG tablet  Take 1 tablet (160 mg total) by mouth daily.     gabapentin 300 MG capsule  Commonly known as:  NEURONTIN  Take 1 capsule (300 mg total) by mouth 3 (three) times daily.     glimepiride 4 MG tablet  Commonly known as:  AMARYL  Take 1 tablet (4 mg total) by mouth daily before breakfast.     glucose blood test strip  Check blood sugar twice daily     HUMIRA 40 MG/0.8ML Pskt  Generic drug:  Adalimumab  Inject into the skin.     losartan 100 MG tablet  Commonly known as:  COZAAR  Take 50 mg by mouth daily.     omeprazole 40 MG  capsule  Commonly known as:  PRILOSEC  Take 1 capsule (40 mg total) by mouth daily.     ondansetron 4 MG tablet  Commonly known as:  ZOFRAN  Take 1 tablet (4 mg total) by mouth every 6 (six) hours.     oxyCODONE-acetaminophen 5-325 MG tablet  Commonly known as:  ROXICET  Take 1 tablet by mouth every 8 (eight) hours as needed for severe pain.     sildenafil 20 MG tablet  Commonly known as:  REVATIO  Take 2-3 tablets (40-60 mg total) by mouth daily as needed.     sitaGLIPtin 100 MG tablet  Commonly known as:  JANUVIA  Take 1 tablet (100 mg total) by mouth daily.     zolpidem 5 MG tablet  Commonly known as:  AMBIEN  Take 1 tablet (5 mg total) by mouth at bedtime as needed for sleep.           Objective:   Physical Exam BP 114/76 mmHg  Pulse 71  Temp(Src) 98.1 F (36.7 C) (Oral)  Ht 5' 11"  (1.803 m)  Wt 263 lb 2 oz (119.353 kg)  BMI 36.71 kg/m2  SpO2 97% General:   Well developed, well nourished . NAD.  HEENT:  Normocephalic . Face symmetric, atraumatic Lungs:  CTA B Normal respiratory effort, no intercostal retractions, no accessory muscle use. Heart: RRR,  no  murmur.  no pretibial edema bilaterally  Abdomen:  Not distended, soft, non-tender. No rebound or rigidity. No mass,organomegaly Skin: Not pale. Not jaundice Neurologic:  alert & oriented X3.  Speech normal, gait appropriate for age and unassisted Psych--  Cognition and judgment appear intact.  Cooperative with normal attention span and concentration.  Behavior appropriate. No anxious or depressed appearing.    Assessment & Plan:   Assessment > DM w/ neuropathy  HTN Hyperlipidemia: Started fenofibrate 04/09/2015 OSA ,   dx 2012, sleep study again 02-2015 Dr Dohmeier--> severe OSA, rx CPAP Depression, insomnia ----on Cymbalta Chronic headaches -----on Cymbalta MSK: on disability d/t back pain Disability: d/t back pain, HAs Hypogonadism  Dx 2012, normal T 11-2014 (on no RX) GI:  --GERD, diverticulitis 2014, h/o PUD --Fatty liver per [place] 05-2014 (? Of cirrhosis per CT 2014) Psoriasis, psoriatic arthritis -- on HUMIRA  Posttraumatic hydrocephalus s/p 2 shunts (first got infected) H/u urolithiasis +FH CAD brother MI age 31   Plan  HTN: At the last visit, creat remained elevated >> losartan dose decreased to 50 mg and carvedilol dose increased to 12.5 twice a day. Good compliance, ambulatory BPs normal. Check a BMP, avoid NSAIDs, drink plenty of fluids  DM: metformin dc at the last visit due to elevated creat. Rechecking renal fx, further advise w/ results: ?restart metformin ?increase amaryl. rec to check CBGs qd RTC 3 months

## 2015-08-07 NOTE — Patient Instructions (Addendum)
GO TO THE LAB : Get the blood work    GO TO THE FRONT DESK  Schedule a routine office visit or check up to be done in  3 months  Please be fasting   Front desk:    30  Diabetes: Check your blood sugar  once a day      Check your blood sugar  at different times of the day  GOALS: Fasting before a meal 70- 130 2 hours after a meal less than 180 At bedtime 90-150

## 2015-08-09 ENCOUNTER — Encounter: Payer: Self-pay | Admitting: Internal Medicine

## 2015-08-09 ENCOUNTER — Ambulatory Visit (INDEPENDENT_AMBULATORY_CARE_PROVIDER_SITE_OTHER): Payer: 59 | Admitting: Internal Medicine

## 2015-08-09 VITALS — BP 90/62 | HR 74 | Temp 97.9°F | Resp 16 | Ht 71.0 in | Wt 263.8 lb

## 2015-08-09 DIAGNOSIS — E1165 Type 2 diabetes mellitus with hyperglycemia: Secondary | ICD-10-CM

## 2015-08-09 DIAGNOSIS — N19 Unspecified kidney failure: Secondary | ICD-10-CM

## 2015-08-09 MED ORDER — INSULIN GLARGINE 100 UNIT/ML SOLOSTAR PEN: 15.0000 [IU] | PEN_INJECTOR | Freq: Every day | SUBCUTANEOUS | Status: DC

## 2015-08-09 MED ORDER — INSULIN PEN NEEDLE 32G X 4 MM MISC: Status: DC

## 2015-08-09 MED ORDER — GLUCOSE BLOOD VI STRP: ORAL_STRIP | Status: DC

## 2015-08-09 MED FILL — BASAGLAR 100 UNIT/ML KWIKPE: 100 | 90 days supply | Qty: 15 | Fill #0

## 2015-08-09 MED FILL — BD PEN NDL NANO 32GX5/32: 32G X 4 MM | 25 days supply | Qty: 100 | Fill #0

## 2015-08-09 MED FILL — TRUE METRIX GLUCOSE TEST ST: 50 days supply | Qty: 100 | Fill #0

## 2015-08-09 NOTE — Patient Instructions (Addendum)
Start Basaglar 15 units every night  Check your blood sugar at least once a day in the mornings  Increase Basaglar  by 4 units every 2 days until  your morning sugar is 150 or less  Watch for low blood sugars  Stop losartan: Check your blood pressures daily, if they go over 145/85 let me know   Decrease Januvia 100 mg to only half tablet a day  Drink plenty of fluids  Avoid Motrin or similar medication  Call me with your blood sugar readings in 10 days  Come back in one month

## 2015-08-09 NOTE — Progress Notes (Signed)
Pre visit review using our clinic review tool, if applicable. No additional management support is needed unless otherwise documented below in the visit note/SLS  

## 2015-08-09 NOTE — Progress Notes (Signed)
Subjective:    Patient ID: James [name], male    DOB: 1955-12-15, 60 y.o.   MRN: [id number]  DOS:  08/09/2015 Type of visit - description : Acute visit to discuss diabetes management Interval history: A1c was extremely high and creatinine continue increasing. Last night, CBG was in the 500th. This morning 350. Other than polyuria he feels okay. Denied chest pain, difficulty breathing, nausea or vomiting.   Review of Systems   Past Medical History  Diagnosis Date  . Hypertension   . Diabetes mellitus with neuropathy ([place])     borderline per primary MD  . Stomach ulcer   . Acid reflux   . Kidney stone   . Post-traumatic hydrocephalus     s/p shunts x 2 (first got infected )  . Psoriasis     sees Dr [name]  . Eczema   . Diverticulitis 03/2013  . Testosterone deficiency 04/28/2011  . Insomnia 04/26/2013  . Depression     on cymbalta  . Chronic headaches     on cymbalta  . Psoriatic arthritis ([place])   . 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 Total hip arthroplasty Left 2011  . Shoulder surgery Left 2010  . Back surgery  1980  . Ventriculoperitoneal shunt  2007    x2    Social History   Social History  . Marital Status: Married    Spouse Name: [name]  . Number of Children: 2  . Years of Education: N/A   Occupational History  . disable     Social History Main Topics  . Smoking status: Never Smoker   . Smokeless tobacco: Never Used     Comment: Tried when younger.    . Alcohol Use: No     Comment: patient states he cannot drink etoh because it causes him to flush   . Drug Use: No  . Sexual Activity: Not on file   Other Topics Concern  . Not on file   Social History Narrative   Household-- pt , wife, one children            Medication List       This list is accurate as of: 08/09/15 11:59 PM.  Always use your most recent med list.               carvedilol 12.5 MG tablet  Commonly known as:  COREG  Take 1 tablet (12.5 mg total) by mouth 2 (two) times daily with a meal.     DULoxetine 60 MG capsule  Commonly known as:  CYMBALTA  Take 2 capsules (120 mg total) by mouth daily.     fenofibrate 160 MG tablet  Take 1 tablet (160 mg total) by mouth daily.     gabapentin 300 MG capsule  Commonly known as:  NEURONTIN  Take 1 capsule (300 mg total) by mouth 3 (three) times daily.     glimepiride 4 MG tablet  Commonly known as:  AMARYL  Take 1 tablet (4 mg total) by mouth daily before breakfast.     glucose blood test strip  Check blood sugar twice daily     HUMIRA 40 MG/0.8ML Pskt  Generic drug:  Adalimumab  Inject into the skin.     Insulin Glargine 100 UNIT/ML Solostar Pen  Commonly known as:  BASAGLAR KWIKPEN  Inject 15 Units into the skin daily at 10 pm. Increase  4 units every 2 days until blood sugars are 150 or less.     Insulin Pen Needle 32G X 4 MM Misc  To use with Basaglar     omeprazole 40 MG capsule  Commonly known as:  PRILOSEC  Take 1 capsule (40 mg total) by mouth daily.     ondansetron 4 MG tablet  Commonly known as:  ZOFRAN  Take 1 tablet (4 mg total) by mouth every 6 (six) hours.     oxyCODONE-acetaminophen 5-325 MG tablet  Commonly known as:  ROXICET  Take 1 tablet by mouth every 8 (eight) hours as needed for severe pain.     sildenafil 20 MG tablet  Commonly known as:  REVATIO  Take 2-3 tablets (40-60 mg total) by mouth daily as needed.     sitaGLIPtin 100 MG tablet  Commonly known as:  JANUVIA  Take 50 mg by mouth daily.     zolpidem 5 MG tablet  Commonly known as:  AMBIEN  Take 1 tablet (5 mg total) by mouth at bedtime as needed for sleep.           Objective:   Physical Exam BP 90/62 mmHg  Pulse 74  Temp(Src) 97.9 F (36.6 C) (Oral)  Resp 16  Ht 5' 11"  (1.803 m)  Wt 263 lb 13 oz (119.665 kg)  BMI 36.81 kg/m2  SpO2 97% General:   Well developed, well nourished . NAD.    HEENT:  Normocephalic . Face symmetric, atraumatic Neurologic:  alert & oriented X3.  Speech normal, gait appropriate for age and unassisted Psych--  Cognition and judgment appear intact.  Cooperative with normal attention span and concentration.  Behavior appropriate. No anxious or depressed appearing.      Assessment & Plan:   Assessment > DM w/ neuropathy ; rx insulin 08-09-15 HTN Hyperlipidemia: Started fenofibrate 04/09/2015 OSA ,   dx 2012, sleep study again 02-2015 Dr Dohmeier--> severe OSA, rx CPAP Depression, insomnia ----on Cymbalta Chronic headaches -----on Cymbalta MSK: on disability d/t back pain Disability: d/t back pain, HAs Hypogonadism  Dx 2012, normal T 11-2014 (on no RX) GI:  --GERD, diverticulitis 2014, h/o PUD --Fatty liver per [place] 05-2014 (? Of cirrhosis per CT 2014) Psoriasis, psoriatic arthritis -- on HUMIRA  Posttraumatic hydrocephalus s/p 2 shunts (first got infected) H/u urolithiasis +FH CAD brother MI age 13   PLAN 08-09-15 DM: poorly control, fortunately he is tolerating hyperglycemia well. Currently on glimepiride and Januvia 100 mg. Last A1c 10.1. Creatinine continue to be elevated. Plan: Start Basaglar 15 units daily at bedtime, increased by 4 units every 2 days. See instructions Decrease Januvia to 50 mg d/t kidney function RTC one month. Renal failure: Creatinine went from 1.7 to 2.06 >> dc  losartan, recheck renal function one month, refer to renal.  Previous plans Plan 08-07-15 HTN: At the last visit, creat remained elevated >> losartan dose decreased to 50 mg and carvedilol dose increased to 12.5 twice a day. Good compliance, ambulatory BPs normal. Check a BMP, avoid NSAIDs, drink plenty of fluids  DM: metformin dc at the last visit due to elevated creat. Rechecking renal fx, further advise w/ results: ?restart metformin ?increase amaryl. rec to check CBGs qd  Plan 07-02-15 Acute gastroenteritis: Resolved, it was complicated by increased  creatinine. HTN:  temporarily held losartan due to increased creatinine,  check a CMP  DM: Held temporarily metformin, back on it. Check labs  Addendum:  Creatinine continue to be elevated: d/c  metformin, decrease losartan  to 50 mg daily, increase carvedilol to 12.5 twice a day. Call if BP or blood sugar very high. We will repeat her labs when he comes back. Discussed with the patient, he  verbalize understanding. If not better will need nephrology referral   04-08-15 DM: Started Januvia d/t A1c of 8.2 on  11-2014. Check labs.He has seen a nutritionist recently.Encourage a healthier ifestyle. Hyperlipidemia:Labs  Sleep apnea: Started a trial with CPAP 2 weeks ago, risk of untreated sleep apnea discussed. ED: new problem. Trial with Viagra, compliance of medication side effects discussed. History of hypogonadism but normal testosterone recently Left wrist cyst: Symptomatic, refer to orthopedic surgery

## 2015-08-12 ENCOUNTER — Ambulatory Visit: Payer: Medicare Other | Admitting: Internal Medicine

## 2015-08-14 ENCOUNTER — Emergency Department (HOSPITAL_BASED_OUTPATIENT_CLINIC_OR_DEPARTMENT_OTHER)
Admission: EM | Admit: 2015-08-14 | Discharge: 2015-08-14 | Disposition: A | Discharge status: Home / Self Care | Payer: 59 | Attending: Emergency Medicine | Admitting: Emergency Medicine

## 2015-08-14 ENCOUNTER — Telehealth: Payer: Self-pay | Admitting: Internal Medicine

## 2015-08-14 ENCOUNTER — Ambulatory Visit (INDEPENDENT_AMBULATORY_CARE_PROVIDER_SITE_OTHER): Payer: 59 | Admitting: Internal Medicine

## 2015-08-14 ENCOUNTER — Encounter: Payer: Self-pay | Admitting: Internal Medicine

## 2015-08-14 ENCOUNTER — Encounter (HOSPITAL_BASED_OUTPATIENT_CLINIC_OR_DEPARTMENT_OTHER): Payer: Self-pay

## 2015-08-14 VITALS — BP 132/84 | HR 69 | Temp 98.2°F | Ht 71.0 in | Wt 263.0 lb

## 2015-08-14 DIAGNOSIS — E1122 Type 2 diabetes mellitus with diabetic chronic kidney disease: Secondary | ICD-10-CM

## 2015-08-14 DIAGNOSIS — F329 Major depressive disorder, single episode, unspecified: Secondary | ICD-10-CM | POA: Insufficient documentation

## 2015-08-14 DIAGNOSIS — N182 Chronic kidney disease, stage 2 (mild): Secondary | ICD-10-CM

## 2015-08-14 DIAGNOSIS — N19 Unspecified kidney failure: Secondary | ICD-10-CM | POA: Diagnosis not present

## 2015-08-14 DIAGNOSIS — Z872 Personal history of diseases of the skin and subcutaneous tissue: Secondary | ICD-10-CM | POA: Insufficient documentation

## 2015-08-14 DIAGNOSIS — I10 Essential (primary) hypertension: Secondary | ICD-10-CM | POA: Diagnosis not present

## 2015-08-14 DIAGNOSIS — Z79899 Other long term (current) drug therapy: Secondary | ICD-10-CM | POA: Diagnosis not present

## 2015-08-14 DIAGNOSIS — E1165 Type 2 diabetes mellitus with hyperglycemia: Secondary | ICD-10-CM

## 2015-08-14 DIAGNOSIS — E1140 Type 2 diabetes mellitus with diabetic neuropathy, unspecified: Secondary | ICD-10-CM | POA: Diagnosis not present

## 2015-08-14 DIAGNOSIS — H81399 Other peripheral vertigo, unspecified ear: Secondary | ICD-10-CM | POA: Insufficient documentation

## 2015-08-14 DIAGNOSIS — Z794 Long term (current) use of insulin: Secondary | ICD-10-CM | POA: Diagnosis not present

## 2015-08-14 DIAGNOSIS — E860 Dehydration: Secondary | ICD-10-CM | POA: Diagnosis not present

## 2015-08-14 DIAGNOSIS — N289 Disorder of kidney and ureter, unspecified: Secondary | ICD-10-CM | POA: Diagnosis not present

## 2015-08-14 DIAGNOSIS — R739 Hyperglycemia, unspecified: Secondary | ICD-10-CM

## 2015-08-14 DIAGNOSIS — Z87442 Personal history of urinary calculi: Secondary | ICD-10-CM | POA: Insufficient documentation

## 2015-08-14 DIAGNOSIS — G4700 Insomnia, unspecified: Secondary | ICD-10-CM | POA: Diagnosis not present

## 2015-08-14 DIAGNOSIS — R0981 Nasal congestion: Secondary | ICD-10-CM | POA: Diagnosis not present

## 2015-08-14 DIAGNOSIS — K219 Gastro-esophageal reflux disease without esophagitis: Secondary | ICD-10-CM | POA: Diagnosis not present

## 2015-08-14 DIAGNOSIS — Z09 Encounter for follow-up examination after completed treatment for conditions other than malignant neoplasm: Secondary | ICD-10-CM

## 2015-08-14 DIAGNOSIS — Z7984 Long term (current) use of oral hypoglycemic drugs: Secondary | ICD-10-CM | POA: Diagnosis not present

## 2015-08-14 LAB — CBC
HEMATOCRIT: 39 % (ref 39.0–52.0)
Hemoglobin: 13.5 g/dL (ref 13.0–17.0)
MCH: 29.9 pg (ref 26.0–34.0)
MCHC: 34.6 g/dL (ref 30.0–36.0)
MCV: 86.3 fL (ref 78.0–100.0)
Platelets: 143 10*3/uL — ABNORMAL LOW (ref 150–400)
RBC: 4.52 MIL/uL (ref 4.22–5.81)
RDW: 13 % (ref 11.5–15.5)
WBC: 6.3 10*3/uL (ref 4.0–10.5)

## 2015-08-14 LAB — URINE MICROSCOPIC-ADD ON
Bacteria, UA: NONE SEEN
RBC / HPF: NONE SEEN RBC/hpf (ref 0–5)
WBC UA: NONE SEEN WBC/hpf (ref 0–5)

## 2015-08-14 LAB — BASIC METABOLIC PANEL
ANION GAP: 6 (ref 5–15)
Anion gap: 9 (ref 5–15)
BUN: 27 mg/dL — AB (ref 6–20)
BUN: 24 mg/dL — ABNORMAL HIGH (ref 6–20)
CHLORIDE: 99 mmol/L — AB (ref 101–111)
CHLORIDE: 105 mmol/L (ref 101–111)
CO2: 25 mmol/L (ref 22–32)
CO2: 26 mmol/L (ref 22–32)
CREATININE: 1.99 mg/dL — AB (ref 0.61–1.24)
Calcium: 9.4 mg/dL (ref 8.9–10.3)
Calcium: 8.5 mg/dL — ABNORMAL LOW (ref 8.9–10.3)
Creatinine, Ser: 1.75 mg/dL — ABNORMAL HIGH (ref 0.61–1.24)
GFR calc Af Amer: 41 mL/min — ABNORMAL LOW (ref >60)
GFR calc non Af Amer: 36 mL/min — ABNORMAL LOW (ref >60)
GFR calc non Af Amer: 41 mL/min — ABNORMAL LOW (ref >60)
GFR, EST AFRICAN AMERICAN: 48 mL/min — AB (ref >60)
Glucose, Bld: 421 mg/dL — ABNORMAL HIGH (ref 65–99)
Glucose, Bld: 245 mg/dL — ABNORMAL HIGH (ref 65–99)
POTASSIUM: 4.7 mmol/L (ref 3.5–5.1)
POTASSIUM: 4 mmol/L (ref 3.5–5.1)
SODIUM: 133 mmol/L — AB (ref 135–145)
Sodium: 137 mmol/L (ref 135–145)

## 2015-08-14 LAB — URINALYSIS, ROUTINE W REFLEX MICROSCOPIC
Bilirubin Urine: NEGATIVE
HGB URINE DIPSTICK: NEGATIVE
Ketones, ur: NEGATIVE mg/dL
LEUKOCYTES UA: NEGATIVE
Nitrite: NEGATIVE
PH: 5.5 (ref 5.0–8.0)
Protein, ur: NEGATIVE mg/dL
Specific Gravity, Urine: 1.03 (ref 1.005–1.030)

## 2015-08-14 LAB — CBG MONITORING, ED
GLUCOSE-CAPILLARY: 303 mg/dL — AB (ref 65–99)
Glucose-Capillary: 250 mg/dL — ABNORMAL HIGH (ref 65–99)

## 2015-08-14 LAB — GLUCOSE, POCT (MANUAL RESULT ENTRY): POC GLUCOSE: 364 mg/dL — AB (ref 70–99)

## 2015-08-14 MED ORDER — SODIUM CHLORIDE 0.9 % IV BOLUS (SEPSIS)
1000.0000 mL | Freq: Once | INTRAVENOUS | Status: AC
  Administered 2015-08-14: 1000 mL via INTRAVENOUS

## 2015-08-14 MED ORDER — INSULIN REGULAR HUMAN 100 UNIT/ML IJ SOLN
10.0000 [IU] | Freq: Once | INTRAMUSCULAR | Status: AC
  Administered 2015-08-14: 10 [IU] via INTRAVENOUS
  Filled 2015-08-14: qty 1

## 2015-08-14 MED ORDER — ONDANSETRON HCL 4 MG/2ML IJ SOLN
4.0000 mg | Freq: Once | INTRAMUSCULAR | Status: AC
  Administered 2015-08-14: 4 mg via INTRAVENOUS
  Filled 2015-08-14: qty 2

## 2015-08-14 MED ORDER — LORATADINE 10 MG PO TABS: 10.0000 mg | ORAL_TABLET | Freq: Every day | ORAL | Status: DC

## 2015-08-14 MED ORDER — MECLIZINE HCL 25 MG PO TABS: 25.0000 mg | ORAL_TABLET | Freq: Three times a day (TID) | ORAL | Status: DC | PRN

## 2015-08-14 MED ORDER — MECLIZINE HCL 25 MG PO TABS
25.0000 mg | ORAL_TABLET | Freq: Once | ORAL | Status: AC
Start: 2015-08-14 — End: 2015-08-14
  Administered 2015-08-14: 25 mg via ORAL
  Filled 2015-08-14: qty 1

## 2015-08-14 NOTE — Telephone Encounter (Signed)
Informed patient of the provider's recommendations below. He understood, but expressed that he would like to keep his current appointment to be seen today at 2:30 PM.

## 2015-08-14 NOTE — ED Notes (Signed)
Pt sent form PCP in the building for elevated BS-BS 364 from paperwork sent-no meds given in office per pt

## 2015-08-14 NOTE — Telephone Encounter (Signed)
Patient Name: James [name]  DOB: June 10, 1956    Initial Comment Caller states his blood sugar has been high, over 500 yesterday and day before, has been having confusion and dizziness   Nurse Assessment  Nurse: Mallie Mussel, RN, Alveta Heimlich Date/Time Eilene Ghazi Time): 08/14/2015 10:11:47 AM  Confirm and document reason for call. If symptomatic, describe symptoms. You must click the next button to save text entered. ---Caller states that his blood sugar was 500 yesterday and the day before. This morning, the reading was 365. Current reading is 362. He was put on insulin recently. He uses Basaglar insulin which is a long acting insulin. His doctor took him off of Metformin. He also take Nateglinide and Januvia. Denies vomiting.  Has the patient traveled out of the country within the last 30 days? ---No  Does the patient have any new or worsening symptoms? ---Yes  Will a triage be completed? ---Yes  Related visit to physician within the last 2 weeks? ---No  Does the PT have any chronic conditions? (i.e. diabetes, asthma, etc.) ---Yes  List chronic conditions. ---Diabetes, HTN, Hypercholesterolemia  Is this a behavioral health or substance abuse call? ---No     Guidelines    Guideline Title Affirmed Question Affirmed Notes  Diabetes - High Blood Sugar [1] Blood glucose > 300 mg/dl (16.5 mmol/l) AND [2] two or more times in a row    Final Disposition User   Call PCP Now Mallie Mussel, RN, [place] states that he already has an appointment scheduled for today at 2:30pm. I advised him to drink a full glass of water ever hour for at least 4 hours then check his blood sugar again. I also advised him to call us back if anything changes.  Late Entry He is alert and answering all questions. He states that he only gets dizzy if he bends over then stands back up.   Referrals  REFERRED TO PCP OFFICE   Disagree/Comply: Comply

## 2015-08-14 NOTE — Progress Notes (Signed)
Pre visit review using our clinic review tool, if applicable. No additional management support is needed unless otherwise documented below in the visit note. 

## 2015-08-14 NOTE — ED Notes (Signed)
Pt verbalizes understanding of d/c instructions and denies any further needs at this time. 

## 2015-08-14 NOTE — Patient Instructions (Addendum)
Please go to the ER downstairs, we need to admit you to the hospital for treatment of  diabetes.

## 2015-08-14 NOTE — Telephone Encounter (Addendum)
Increase basaglar to 30 units in the morning. Increase by 5 units every 2 days until blood sugar in the mornings in the 150s. Call with blood sugar readings in 4-5 days Refer to endocrinology.  BP is okay for now, if it goes higher will increase carvedilol.

## 2015-08-14 NOTE — Telephone Encounter (Signed)
Advise patient:

## 2015-08-14 NOTE — ED Provider Notes (Signed)
CSN: [id number]     Arrival date & time 08/14/15  1514 History   First MD Initiated Contact with Patient 08/14/15 1551     Chief Complaint  Patient presents with  . Hyperglycemia     (Consider location/radiation/quality/duration/timing/severity/associated sxs/prior Treatment) HPI Patient is referred from his primary physician for evaluation for persistent hyperglycemia. Recently taken off his metformin and started on insulin. States he's been compliant with the medical regimen. The last couple days he's had increased fatigue, frontal headache and episodes of dizziness. Describes dizziness as room spinning sensation associated with nausea. He's had no fever or chills. Denies any vomiting or diarrhea. Reports increased urinary frequency. Past Medical History  Diagnosis Date  . Hypertension   . Diabetes mellitus with neuropathy ([place])     borderline per primary MD  . Stomach ulcer   . Acid reflux   . Kidney stone   . Post-traumatic hydrocephalus     s/p shunts x 2 (first got infected )  . Psoriasis     sees Dr [name]  . Eczema   . Diverticulitis 03/2013  . Testosterone deficiency 04/28/2011  . Insomnia 04/26/2013  . Depression     on cymbalta  . Chronic headaches     on cymbalta  . Psoriatic arthritis ([employer])   . 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 Total hip arthroplasty Left 2011  . Shoulder surgery Left 2010  . Back surgery  1980  . Ventriculoperitoneal shunt  2007    x2   Family History  Problem Relation Age of Onset  . Lung cancer Father     alive, former smoker   . Heart disease Brother     MI age 60  . Diabetes Neg Hx   . Prostate cancer Neg Hx   . Colon cancer Neg Hx    Social History  Substance Use Topics  . Smoking status: Never Smoker   . Smokeless tobacco: Never Used  . Alcohol Use: No    Review of Systems  Constitutional: Positive for fatigue. Negative for  fever and chills.  HENT: Positive for congestion and sinus pressure. Negative for ear pain, hearing loss, sore throat and tinnitus.   Eyes: Negative for visual disturbance.  Respiratory: Negative for cough and shortness of breath.   Cardiovascular: Negative for chest pain.  Gastrointestinal: Positive for nausea. Negative for vomiting, abdominal pain and diarrhea.  Genitourinary: Positive for frequency. Negative for dysuria, hematuria and flank pain.  Musculoskeletal: Negative for myalgias, back pain and neck pain.  Skin: Negative for rash and wound.  Neurological: Positive for dizziness and headaches. Negative for syncope, weakness, light-headedness and numbness.  All other systems reviewed and are negative.     Allergies  Morphine and related; Sulfa drugs cross reactors; and Hydrocodone-homatropine  Home Medications   Prior to Admission medications   Medication Sig Start Date End Date Taking? Authorizing Provider  Adalimumab (HUMIRA) 40 MG/0.8ML PSKT Inject into the skin.    Historical Provider, MD  carvedilol (COREG) 12.5 MG tablet Take 1 tablet (12.5 mg total) by mouth 2 (two) times daily with a meal. 07/02/15   Colon Branch, MD  DULoxetine (CYMBALTA) 60 MG capsule Take 2 capsules (120 mg total) by mouth daily. 04/19/15   Colon Branch, MD  fenofibrate 160 MG tablet Take 1 tablet (160 mg total) by mouth daily. 04/11/15   Colon Branch, MD  gabapentin (NEURONTIN)  300 MG capsule Take 1 capsule (300 mg total) by mouth 3 (three) times daily. 02/19/15   Colon Branch, MD  glimepiride (AMARYL) 4 MG tablet Take 1 tablet (4 mg total) by mouth daily before breakfast. 12/05/14   Colon Branch, MD  glucose blood test strip Check blood sugar twice daily Patient not taking: Reported on 08/14/2015 08/09/15   Colon Branch, MD  Insulin Glargine [facility]) 100 UNIT/ML Solostar Pen Inject 15 Units into the skin daily at 10 pm. Increase 4 units every 2 days until blood sugars are 150 or less. 08/09/15   Colon Branch, MD   Insulin Pen Needle 32G X 4 MM MISC To use with Basaglar Patient not taking: Reported on 08/14/2015 08/09/15   Colon Branch, MD  loratadine (CLARITIN) 10 MG tablet Take 1 tablet (10 mg total) by mouth daily. 08/14/15   Julianne Rice, MD  meclizine (ANTIVERT) 25 MG tablet Take 1 tablet (25 mg total) by mouth 3 (three) times daily as needed for dizziness or nausea. 08/14/15   Julianne Rice, MD  omeprazole (PRILOSEC) 40 MG capsule Take 1 capsule (40 mg total) by mouth daily. 04/19/15   Colon Branch, MD  ondansetron (ZOFRAN) 4 MG tablet Take 1 tablet (4 mg total) by mouth every 6 (six) hours. 06/27/15   Quintella Reichert, MD  oxyCODONE-acetaminophen (ROXICET) 5-325 MG tablet Take 1 tablet by mouth every 8 (eight) hours as needed for severe pain. 04/12/15   Colon Branch, MD  sildenafil (REVATIO) 20 MG tablet Take 2-3 tablets (40-60 mg total) by mouth daily as needed. 04/08/15   Colon Branch, MD  sitaGLIPtin (JANUVIA) 100 MG tablet Take 50 mg by mouth daily.    Historical Provider, MD  zolpidem (AMBIEN) 5 MG tablet Take 1 tablet (5 mg total) by mouth at bedtime as needed for sleep. 05/07/15   Carmen Dohmeier, MD   BP 128/87 mmHg  Pulse 64  Temp(Src) 97.5 F (36.4 C) (Oral)  Resp 20  Ht 5' 11"  (1.803 m)  Wt 263 lb (119.296 kg)  BMI 36.70 kg/m2  SpO2 97% Physical Exam  Constitutional: He is oriented to person, place, and time. He appears well-developed and well-nourished. No distress.  HENT:  Head: Normocephalic and atraumatic.  Mouth/Throat: Oropharynx is clear and moist. No oropharyngeal exudate.  No sinus tenderness with percussion. Patient does have bulging bilateral TMs. Dry lips  Eyes: EOM are normal. Pupils are equal, round, and reactive to light.  Fatigable rotary nystagmus  Neck: Normal range of motion. Neck supple.  Cardiovascular: Normal rate and regular rhythm.  Exam reveals no gallop and no friction rub.   No murmur heard. Pulmonary/Chest: Effort normal and breath sounds normal. No  respiratory distress. He has no wheezes. He has no rales.  Abdominal: Soft. Bowel sounds are normal. He exhibits no distension and no mass. There is no tenderness. There is no rebound and no guarding.  Musculoskeletal: Normal range of motion. He exhibits no edema or tenderness.  No lower extremity swelling or pain. Distal pulses equal and intact.  Lymphadenopathy:    He has no cervical adenopathy.  Neurological: He is alert and oriented to person, place, and time.  Patient is alert and oriented x3 with clear, goal oriented speech. Patient has 5/5 motor in all extremities. Sensation is intact to light touch. Bilateral finger-to-nose is normal with no signs of dysmetria.  Skin: Skin is warm and dry. No rash noted. He is not diaphoretic. No erythema.  Psychiatric: He has a normal mood and affect. His behavior is normal.  Nursing note and vitals reviewed.   ED Course  Procedures (including critical care time) Labs Review Labs Reviewed  BASIC METABOLIC PANEL - Abnormal; Notable for the following:    Sodium 133 (*)    Chloride 99 (*)    Glucose, Bld 421 (*)    BUN 27 (*)    Creatinine, Ser 1.99 (*)    GFR calc non Af Amer 36 (*)    GFR calc Af Amer 41 (*)    All other components within normal limits  CBC - Abnormal; Notable for the following:    Platelets 143 (*)    All other components within normal limits  URINALYSIS, ROUTINE W REFLEX MICROSCOPIC (NOT AT [facility]) - Abnormal; Notable for the following:    Glucose, UA >1000 (*)    All other components within normal limits  URINE MICROSCOPIC-ADD ON - Abnormal; Notable for the following:    Squamous Epithelial / LPF 0-5 (*)    All other components within normal limits  BASIC METABOLIC PANEL - Abnormal; Notable for the following:    Glucose, Bld 245 (*)    BUN 24 (*)    Creatinine, Ser 1.75 (*)    Calcium 8.5 (*)    GFR calc non Af Amer 41 (*)    GFR calc Af Amer 48 (*)    All other components within normal limits  CBG MONITORING, ED -  Abnormal; Notable for the following:    Glucose-Capillary 303 (*)    All other components within normal limits  CBG MONITORING, ED - Abnormal; Notable for the following:    Glucose-Capillary 250 (*)    All other components within normal limits  CBG MONITORING, ED    Imaging Review No results found. I have personally reviewed and evaluated these images and lab results as part of my medical decision-making.   EKG Interpretation   Date/Time:  Wednesday August 14 2015 16:19:09 EST Ventricular Rate:  68 PR Interval:  126 QRS Duration: 94 QT Interval:  403 QTC Calculation: 429 R Axis:   -9 Text Interpretation:  Sinus rhythm Abnormal R-wave progression, early  transition Inferior infarct, old Confirmed by Lita Mains  MD, Shelba Susi ([id number])  on 08/14/2015 9:47:01 PM      MDM   Final diagnoses:  Hyperglycemia  Renal insufficiency  Dehydration  Peripheral vertigo, unspecified laterality   Patient is feeling better after IV fluids. Repeat BMP shows improved glucose and improved creatinine. Believe the patient's dizziness is likely multifactorial. I believe it is related to dehydration but patient also has vertiginous symptoms. Patient does have bulging TMs bilaterally. He is ambulating without any dizziness. He has no focal weakness. His vital signs have remained stable in the emergency department. We'll discharge home to follow-up with his primary doctor. We'll give meclizine for vertiginous symptoms. Patient has been encouraged to drink plenty of fluids. Return precautions have been given.     Julianne Rice, MD 08/14/15 2147

## 2015-08-14 NOTE — Telephone Encounter (Signed)
Endo referral placed.

## 2015-08-14 NOTE — Telephone Encounter (Signed)
Pt called in to give an update on his blood sugar and blood pressure.   Blood sugar in morning running range 400- 596   Blood pressure has been running 120/90 as high as 147/90   He is experiencing some dizziness. Scheduled pt an appt. ALSO, transferred  pt to [facility] due to current symptom.

## 2015-08-14 NOTE — Addendum Note (Signed)
Addended byDamita Dunnings D on: 08/14/2015 11:44 AM   Modules accepted: Orders

## 2015-08-14 NOTE — Discharge Instructions (Signed)
Dehydration, Adult Dehydration is a condition in which you do not have enough fluid or water in your body. It happens when you take in less fluid than you lose. Vital organs such as the kidneys, brain, and heart cannot function without a proper amount of fluids. Any loss of fluids from the body can cause dehydration.  Dehydration can range from mild to severe. This condition should be treated right away to help prevent it from becoming severe. CAUSES  This condition may be caused by:  Vomiting.  Diarrhea.  Excessive sweating, such as when exercising in hot or humid weather.  Not drinking enough fluid during strenuous exercise or during an illness.  Excessive urine output.  Fever.  Certain medicines. RISK FACTORS This condition is more likely to develop in:  People who are taking certain medicines that cause the body to lose excess fluid (diuretics).   People who have a chronic illness, such as diabetes, that may increase urination.  Older adults.   People who live at high altitudes.   People who participate in endurance sports.  SYMPTOMS  Mild Dehydration  Thirst.  Dry lips.  Slightly dry mouth.  Dry, warm skin. Moderate Dehydration  Very dry mouth.   Muscle cramps.   Dark urine and decreased urine production.   Decreased tear production.   Headache.   Light-headedness, especially when you stand up from a sitting position.  Severe Dehydration  Changes in skin.   Cold and clammy skin.   Skin does not spring back quickly when lightly pinched and released.   Changes in body fluids.   Extreme thirst.   No tears.   Not able to sweat when body temperature is high, such as in hot weather.   Minimal urine production.   Changes in vital signs.   Rapid, weak pulse (more than 100 beats per minute when you are sitting still).   Rapid breathing.   Low blood pressure.   Other changes.   Sunken eyes.   Cold hands and feet.    Confusion.  Lethargy and difficulty being awakened.  Fainting (syncope).   Short-term weight loss.   Unconsciousness. DIAGNOSIS  This condition may be diagnosed based on your symptoms. You may also have tests to determine how severe your dehydration is. These tests may include:   Urine tests.   Blood tests.  TREATMENT  Treatment for this condition depends on the severity. Mild or moderate dehydration can often be treated at home. Treatment should be started right away. Do not wait until dehydration becomes severe. Severe dehydration needs to be treated at the hospital. Treatment for Mild Dehydration  Drinking plenty of water to replace the fluid you have lost.   Replacing minerals in your blood (electrolytes) that you may have lost.  Treatment for Moderate Dehydration  Consuming oral rehydration solution (ORS). Treatment for Severe Dehydration  Receiving fluid through an IV tube.   Receiving electrolyte solution through a feeding tube that is passed through your nose and into your stomach (nasogastric tube or NG tube).  Correcting any abnormalities in electrolytes. HOME CARE INSTRUCTIONS   Drink enough fluid to keep your urine clear or pale yellow.   Drink water or fluid slowly by taking small sips. You can also try sucking on ice cubes.  Have food or beverages that contain electrolytes. Examples include bananas and sports drinks.  Take over-the-counter and prescription medicines only as told by your health care provider.   Prepare ORS according to the manufacturer's instructions. Take sips  of ORS every 5 minutes until your urine returns to normal.  If you have vomiting or diarrhea, continue to try to drink water, ORS, or both.   If you have diarrhea, avoid:   Beverages that contain caffeine.   Fruit juice.   Milk.   Carbonated soft drinks.  Do not take salt tablets. This can lead to the condition of having too much sodium in your body  (hypernatremia).  SEEK MEDICAL CARE IF:  You cannot eat or drink without vomiting.  You have had moderate diarrhea during a period of more than 24 hours.  You have a fever. SEEK IMMEDIATE MEDICAL CARE IF:   You have extreme thirst.  You have severe diarrhea.  You have not urinated in 6-8 hours, or you have urinated only a small amount of very dark urine.  You have shriveled skin.  You are dizzy, confused, or both.   This information is not intended to replace advice given to you by your health care provider. Make sure you discuss any questions you have with your health care provider.   Document Released: 06/15/2005 Document Revised: 03/06/2015 Document Reviewed: 10/31/2014 Elsevier Interactive Patient Education 2016 [employer]  Dizziness Dizziness is a common problem. It is a feeling of unsteadiness or light-headedness. You may feel like you are about to faint. Dizziness can lead to injury if you stumble or fall. Anyone can become dizzy, but dizziness is more common in older adults. This condition can be caused by a number of things, including medicines, dehydration, or illness. HOME CARE INSTRUCTIONS Taking these steps may help with your condition: Eating and Drinking  Drink enough fluid to keep your urine clear or pale yellow. This helps to keep you from becoming dehydrated. Try to drink more clear fluids, such as water.  Do not drink alcohol.  Limit your caffeine intake if directed by your health care provider.  Limit your salt intake if directed by your health care provider. Activity  Avoid making quick movements.  Rise slowly from chairs and steady yourself until you feel okay.  In the morning, first sit up on the side of the bed. When you feel okay, stand slowly while you hold onto something until you know that your balance is fine.  Move your legs often if you need to stand in one place for a long time. Tighten and relax your muscles in your legs while you  are standing.  Do not drive or operate heavy machinery if you feel dizzy.  Avoid bending down if you feel dizzy. Place items in your home so that they are easy for you to reach without leaning over. Lifestyle  Do not use any tobacco products, including cigarettes, chewing tobacco, or electronic cigarettes. If you need help quitting, ask your health care provider.  Try to reduce your stress level, such as with yoga or meditation. Talk with your health care provider if you need help. General Instructions  Watch your dizziness for any changes.  Take medicines only as directed by your health care provider. Talk with your health care provider if you think that your dizziness is caused by a medicine that you are taking.  Tell a friend or a family member that you are feeling dizzy. If he or she notices any changes in your behavior, have this person call your health care provider.  Keep all follow-up visits as directed by your health care provider. This is important. SEEK MEDICAL CARE IF:  Your dizziness does not go away.  Your dizziness or light-headedness gets worse.  You feel nauseous.  You have reduced hearing.  You have new symptoms.  You are unsteady on your feet or you feel like the room is spinning. SEEK IMMEDIATE MEDICAL CARE IF:  You vomit or have diarrhea and are unable to eat or drink anything.  You have problems talking, walking, swallowing, or using your arms, hands, or legs.  You feel generally weak.  You are not thinking clearly or you have trouble forming sentences. It may take a friend or family member to notice this.  You have chest pain, abdominal pain, shortness of breath, or sweating.  Your vision changes.  You notice any bleeding.  You have a headache.  You have neck pain or a stiff neck.  You have a fever.   This information is not intended to replace advice given to you by your health care provider. Make sure you discuss any questions you have  with your health care provider.   Document Released: 12/09/2000 Document Revised: 10/30/2014 Document Reviewed: 06/11/2014 Elsevier Interactive Patient Education 2016 [place].  Hyperglycemia Hyperglycemia occurs when the glucose (sugar) in your blood is too high. Hyperglycemia can happen for many reasons, but it most often happens to people who do not know they have diabetes or are not managing their diabetes properly.  CAUSES  Whether you have diabetes or not, there are other causes of hyperglycemia. Hyperglycemia can occur when you have diabetes, but it can also occur in other situations that you might not be as aware of, such as: Diabetes  If you have diabetes and are having problems controlling your blood glucose, hyperglycemia could occur because of some of the following reasons:  Not following your meal plan.  Not taking your diabetes medications or not taking it properly.  Exercising less or doing less activity than you normally do.  Being sick. Pre-diabetes  This cannot be ignored. Before people develop Type 2 diabetes, they almost always have "pre-diabetes." This is when your blood glucose levels are higher than normal, but not yet high enough to be diagnosed as diabetes. Research has shown that some long-term damage to the body, especially the heart and circulatory system, may already be occurring during pre-diabetes. If you take action to manage your blood glucose when you have pre-diabetes, you may delay or prevent Type 2 diabetes from developing. Stress  If you have diabetes, you may be "diet" controlled or on oral medications or insulin to control your diabetes. However, you may find that your blood glucose is higher than usual in the hospital whether you have diabetes or not. This is often referred to as "stress hyperglycemia." Stress can elevate your blood glucose. This happens because of hormones put out by the body during times of stress. If stress has been the cause  of your high blood glucose, it can be followed regularly by your caregiver. That way he/she can make sure your hyperglycemia does not continue to get worse or progress to diabetes. Steroids  Steroids are medications that act on the infection fighting system (immune system) to block inflammation or infection. One side effect can be a rise in blood glucose. Most people can produce enough extra insulin to allow for this rise, but for those who cannot, steroids make blood glucose levels go even higher. It is not unusual for steroid treatments to "uncover" diabetes that is developing. It is not always possible to determine if the hyperglycemia will go away after the steroids are stopped. A special  blood test called an A1c is sometimes done to determine if your blood glucose was elevated before the steroids were started. SYMPTOMS  Thirsty.  Frequent urination.  Dry mouth.  Blurred vision.  Tired or fatigue.  Weakness.  Sleepy.  Tingling in feet or leg. DIAGNOSIS  Diagnosis is made by monitoring blood glucose in one or all of the following ways:  A1c test. This is a chemical found in your blood.  Fingerstick blood glucose monitoring.  Laboratory results. TREATMENT  First, knowing the cause of the hyperglycemia is important before the hyperglycemia can be treated. Treatment may include, but is not be limited to:  Education.  Change or adjustment in medications.  Change or adjustment in meal plan.  Treatment for an illness, infection, etc.  More frequent blood glucose monitoring.  Change in exercise plan.  Decreasing or stopping steroids.  Lifestyle changes. HOME CARE INSTRUCTIONS   Test your blood glucose as directed.  Exercise regularly. Your caregiver will give you instructions about exercise. Pre-diabetes or diabetes which comes on with stress is helped by exercising.  Eat wholesome, balanced meals. Eat often and at regular, fixed times. Your caregiver or nutritionist  will give you a meal plan to guide your sugar intake.  Being at an ideal weight is important. If needed, losing as little as 10 to 15 pounds may help improve blood glucose levels. SEEK MEDICAL CARE IF:   You have questions about medicine, activity, or diet.  You continue to have symptoms (problems such as increased thirst, urination, or weight gain). SEEK IMMEDIATE MEDICAL CARE IF:   You are vomiting or have diarrhea.  Your breath smells fruity.  You are breathing faster or slower.  You are very sleepy or incoherent.  You have numbness, tingling, or pain in your feet or hands.  You have chest pain.  Your symptoms get worse even though you have been following your caregiver's orders.  If you have any other questions or concerns.   This information is not intended to replace advice given to you by your health care provider. Make sure you discuss any questions you have with your health care provider.   Document Released: 12/09/2000 Document Revised: 09/07/2011 Document Reviewed: 02/19/2015 Elsevier Interactive Patient Education [employer].

## 2015-08-14 NOTE — Telephone Encounter (Signed)
FYI

## 2015-08-14 NOTE — Progress Notes (Signed)
Subjective:    Patient ID: James [name], male    DOB: December 23, 1955, 60 y.o.   MRN: [id number]  DOS:  08/14/2015 Type of visit - description : Acute visit, dizziness Interval history: The patient started insulin as recommended approximately 5 days ago with Basaglar15 units, CBGs in the morning in the 350 range, afternoon CBGs remain elevated above 500. The last 3 days he increased Basaglar to 19 units but  he is still having similar results. In fact, his blood sugar midmorning yesterday was in the 500th. CBG now 365. He is seen today urgently today because  is getting dizzy for the last 3 days, mostly when he bends over and stands up and in general feeling poorly despite initiation of insulin.  Review of Systems Occasionally vision is blurred. No abdominal pain, nausea, vomiting. he drove to [place] and come back yesterday, denies chest pain, difficulty breathing, leg swelling. Reports feet numbness, worse than previously No slurred speech or facial numbness  Past Medical History  Diagnosis Date  . Hypertension   . Diabetes mellitus with neuropathy ([place])     borderline per primary MD  . Stomach ulcer   . Acid reflux   . Kidney stone   . Post-traumatic hydrocephalus     s/p shunts x 2 (first got infected )  . Psoriasis     sees Dr [name]  . Eczema   . Diverticulitis 03/2013  . Testosterone deficiency 04/28/2011  . Insomnia 04/26/2013  . Depression     on cymbalta  . Chronic headaches     on cymbalta  . Psoriatic arthritis ([place])   . 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 Total hip arthroplasty Left 2011  . Shoulder surgery Left 2010  . Back surgery  1980  . Ventriculoperitoneal shunt  2007    x2    Social History   Social History  . Marital Status: Married    Spouse Name: [name]  . Number of Children: 2  . Years of Education: N/A   Occupational History  . disable      Social History Main Topics  . Smoking status: Never Smoker   . Smokeless tobacco: Never Used     Comment: Tried when younger.    . Alcohol Use: No     Comment: patient states he cannot drink etoh because it causes him to flush   . Drug Use: No  . Sexual Activity: Not on file   Other Topics Concern  . Not on file   Social History Narrative   Household-- pt , wife, one children            Medication List       This list is accurate as of: 08/14/15  2:42 PM.  Always use your most recent med list.               carvedilol 12.5 MG tablet  Commonly known as:  COREG  Take 1 tablet (12.5 mg total) by mouth 2 (two) times daily with a meal.     DULoxetine 60 MG capsule  Commonly known as:  CYMBALTA  Take 2 capsules (120 mg total) by mouth daily.     fenofibrate 160 MG tablet  Take 1 tablet (160 mg total) by mouth daily.     gabapentin 300 MG capsule  Commonly known as:  NEURONTIN  Take 1 capsule (300 mg  total) by mouth 3 (three) times daily.     glimepiride 4 MG tablet  Commonly known as:  AMARYL  Take 1 tablet (4 mg total) by mouth daily before breakfast.     glucose blood test strip  Check blood sugar twice daily     HUMIRA 40 MG/0.8ML Pskt  Generic drug:  Adalimumab  Inject into the skin.     Insulin Glargine 100 UNIT/ML Solostar Pen  Commonly known as:  BASAGLAR KWIKPEN  Inject 15 Units into the skin daily at 10 pm. Increase 4 units every 2 days until blood sugars are 150 or less.     Insulin Pen Needle 32G X 4 MM Misc  To use with Basaglar     omeprazole 40 MG capsule  Commonly known as:  PRILOSEC  Take 1 capsule (40 mg total) by mouth daily.     ondansetron 4 MG tablet  Commonly known as:  ZOFRAN  Take 1 tablet (4 mg total) by mouth every 6 (six) hours.     oxyCODONE-acetaminophen 5-325 MG tablet  Commonly known as:  ROXICET  Take 1 tablet by mouth every 8 (eight) hours as needed for severe pain.     sildenafil 20 MG tablet  Commonly known as:   REVATIO  Take 2-3 tablets (40-60 mg total) by mouth daily as needed.     sitaGLIPtin 100 MG tablet  Commonly known as:  JANUVIA  Take 50 mg by mouth daily.     zolpidem 5 MG tablet  Commonly known as:  AMBIEN  Take 1 tablet (5 mg total) by mouth at bedtime as needed for sleep.           Objective:   Physical Exam BP 132/84 mmHg  Pulse 69  Temp(Src) 98.2 F (36.8 C) (Oral)  Ht 5' 11"  (1.803 m)  Wt 263 lb (119.296 kg)  BMI 36.70 kg/m2  SpO2 97% General:   Well developed, well nourished, she definitely looks slightly weaker than previous days.  HEENT:  Normocephalic . Face symmetric, atraumatic Lungs:  CTA B Normal respiratory effort, no intercostal retractions, no accessory muscle use. Heart: RRR,  no murmur.  No pretibial edema bilaterally  Skin: Not pale. Not jaundice Neurologic:  alert & oriented X3.  Speech normal, gait appropriate for age and unassisted. He got MI, strength symmetric Psych--  Cognition and judgment appear intact.  Cooperative with normal attention span and concentration.  Behavior appropriate. No anxious or depressed appearing.      Assessment & Plan:   Assessment > DM w/ neuropathy ; rx insulin 08-09-15 HTN Hyperlipidemia: Started fenofibrate 04/09/2015 OSA ,   dx 2012, sleep study again 02-2015 Dr Dohmeier--> severe OSA, rx CPAP Depression, insomnia ----on Cymbalta Chronic headaches -----on Cymbalta MSK: on disability d/t back pain Disability: d/t back pain, HAs Hypogonadism  Dx 2012, normal T 11-2014 (on no RX) GI:  --GERD, diverticulitis 2014, h/o PUD --Fatty liver per [place] 05-2014 (? Of cirrhosis per CT 2014) Psoriasis, psoriatic arthritis -- on HUMIRA  Posttraumatic hydrocephalus s/p 2 shunts (first got infected) H/u urolithiasis +FH CAD brother MI age 56  PLAN DM: Diabetes previously well controlled (a1c 6.04 April 2016); CBGs started to increase significantly over the last few weeks, started Basaglar 15 units  5 days ago, CBGs  in the morning  in the 350s but at night they increase to  the 500s.  He was feeling okay until 3 days ago but now he has become more symptomatic. I discussed the situation  with the patient and the family, recommend admission to the hospital for symptomatic blood sugars > 500. They are in agreement. Care discussed with the ER doctor who agreed to take the patient. Likely will need IV fluids, aggressive insulin therapy and diabetes education. Renal failure: Creatinine increasing since December despite d/c  losartan, I already referred him to the nephrologists as an outpatient. His renal function needs to be closely monitored during this admission. HTN: Losartan was discontinued, currently on carvedilol, likely meds  need to be adjusted   Addendum:  Last treated in the emergency room and released home.

## 2015-08-15 ENCOUNTER — Encounter: Payer: Self-pay | Admitting: Internal Medicine

## 2015-08-15 ENCOUNTER — Telehealth: Payer: Self-pay | Admitting: Internal Medicine

## 2015-08-15 MED ORDER — INSULIN LISPRO 100 UNIT/ML (KWIKPEN): PEN_INJECTOR | SUBCUTANEOUS | Status: DC

## 2015-08-15 MED FILL — OMEPRAZOLE DR 40 MG CAPSULE: 40 | 30 days supply | Qty: 30 | Fill #4

## 2015-08-15 MED FILL — HUMALOG 100 UNITS/ML KWIKPE: 100 | 75 days supply | Qty: 15 | Fill #0

## 2015-08-15 MED FILL — DULoxetine HCL 60 MG CPEP: 60 | 30 days supply | Qty: 60 | Fill #4

## 2015-08-15 MED FILL — JANUVIA 100 MG TABLET: 100 | 30 days supply | Qty: 30 | Fill #1

## 2015-08-15 MED FILL — FENOFIBRATE 160 MG TABLET: 160 | 30 days supply | Qty: 30 | Fill #4

## 2015-08-15 MED FILL — NATEGLINIDE 60 MG TABLET: 60 | 30 days supply | Qty: 30 | Fill #4

## 2015-08-15 NOTE — Assessment & Plan Note (Signed)
DM: Diabetes previously well controlled (a1c 6.04 April 2016); CBGs started to increase significantly over the last few weeks, started Basaglar 15 units  5 days ago, CBGs in the morning  in the 350s but at night they increase to  the 500s.  He was feeling okay until 3 days ago but now he has become more symptomatic. I discussed the situation with the patient and the family, recommend admission to the hospital for symptomatic blood sugars > 500. They are in agreement. Care discussed with the ER doctor who agreed to take the patient. Likely will need IV fluids, aggressive insulin therapy and diabetes education. Renal failure: Creatinine increasing since December despite d/c  losartan, I already referred him to the nephrologists as an outpatient. His renal function needs to be closely monitored during this admission. HTN: Losartan was discontinued, currently on carvedilol, likely meds  need to be adjusted   Addendum:  Last treated in the emergency room and released home.

## 2015-08-15 NOTE — Telephone Encounter (Signed)
Was discharged from the ER after feeling better post IV fluids. CBGs morning 350. Call patient: Plan: basaglar 30 units qhs Start Humalog: 5 units before breakfast, 5 units before lunch and 10 units before dinner  (provide samples and Rx) Check CBGs fasting every morning and 2 hours after each meal. Call with readings tomorrow.

## 2015-08-15 NOTE — Telephone Encounter (Signed)
Spoke w/ Pt, informed him to increase Basaglar to 30 units qhs, and start Humalog (5 units before breakfast, 5 before lunch, and 10 units before dinner). Instructed him to check blood sugar fasting every morning and 2 hours after each meal. Instructed him to call us w/ readings tomorrow. Pt verbalized understanding. Humalog sent to [facility]. Informed him we are also still trying to get him in w/ [facility]. Pt again verbalized understanding.

## 2015-08-21 ENCOUNTER — Encounter: Payer: Self-pay | Admitting: Endocrinology

## 2015-08-21 ENCOUNTER — Ambulatory Visit (INDEPENDENT_AMBULATORY_CARE_PROVIDER_SITE_OTHER): Payer: 59 | Admitting: Endocrinology

## 2015-08-21 ENCOUNTER — Other Ambulatory Visit: Payer: Self-pay

## 2015-08-21 VITALS — BP 118/62 | HR 76 | Temp 98.9°F | Ht 71.0 in | Wt 267.0 lb

## 2015-08-21 DIAGNOSIS — G629 Polyneuropathy, unspecified: Secondary | ICD-10-CM

## 2015-08-21 DIAGNOSIS — E1165 Type 2 diabetes mellitus with hyperglycemia: Secondary | ICD-10-CM | POA: Diagnosis not present

## 2015-08-21 DIAGNOSIS — Z794 Long term (current) use of insulin: Secondary | ICD-10-CM | POA: Diagnosis not present

## 2015-08-21 LAB — GLUCOSE, POCT (MANUAL RESULT ENTRY): POC Glucose: 324 mg/dl — AB (ref 70–99)

## 2015-08-21 MED ORDER — GLUCOSE BLOOD VI STRP: ORAL_STRIP | Status: DC

## 2015-08-21 MED ORDER — DULAGLUTIDE 0.75 MG/0.5ML ~~LOC~~ SOAJ: SUBCUTANEOUS | Status: DC

## 2015-08-21 MED FILL — TRULICITY 0.75 MG/0.5 ML PE: 0.75 | 28 days supply | Qty: 2 | Fill #0

## 2015-08-21 NOTE — Progress Notes (Signed)
Patient ID: James [name], male   DOB: 01/06/1956, 60 y.o.   MRN: [id number]           Reason for Appointment: Consultation for Type 2 Diabetes  Referring physician: [name]  History of Present Illness:          Date of diagnosis of type 2 diabetes mellitus:  ?  2014      Background history:  He is not clear how his diabetes was diagnosed, likely on routine testing. Initially had been treated with metformin and also tried on Amaryl  Recent history:   INSULIN regimen is:   Basaglar 30 at bedtime, Humalog 10-31-08  before meals     Current blood sugar patterns and problems identified:  Patient had progressive increase in his blood sugars in January and February with stopping metformin and being on the regimen of Amaryl and Januvia, he thinks his blood sugars went up to 601 and he was having excessive urination  He was started on insulin with basal insulin probably 10 units and this was titrated up by 2 units.  He thinks blood sugars have now started to come down in the morning with increasing it to 30 units last night  Not clear how often he is checking his blood sugars after meals but he thinks they are generally higher by at least 50 mg at lunch and dinner and probably higher at bedtime  Today he took only 5 units for his high-fat breakfast and blood sugar is 324 in the office about 4 hours later  Although he has been seen by diabetes educators for diabetes education he is still not consistently watching his diet or avoiding drinks with sugar  He thinks he is not able to exercise because of various joint problems, back pain and other issues  Non-insulin hypoglycemic drugs the patient is taking are:      Side effects from medications have been:  Compliance with the medical regimen: Fair  Glucose monitoring:  done  times a day         Glucometer:  true result Blood Glucose readings by recall   PREMEAL Breakfast Lunch Dinner Bedtime  Overall   Glucose range: 170-270 300 300  up to  450   Median:        Self-care: The diet that the patient has been following is: None .     Meal times are:  Breakfast is periodically skipped Lunch: 2-3 PM Dinner: 5-6 PM   Typical meal intake: Breakfast is frequently nothing otherwise bacon and eggs.  He is drinking lemonade, Gatorade and green tea but does not avoid sugar drinks               Dietician visit, most recent:?.  He is being seen by her diabetes educator periodically               Exercise: none   Weight history:  Wt Readings from Last 3 Encounters:  08/21/15 267 lb (121.11 kg)  08/14/15 263 lb (119.296 kg)  08/14/15 263 lb (119.296 kg)    Glycemic control:   Lab Results  Component Value Date   HGBA1C 10.1* 08/07/2015   HGBA1C 6.7* 04/08/2015   HGBA1C 8.2* 12/05/2014   Lab Results  Component Value Date   CREATININE 1.75* 08/14/2015          Medication List       This list is accurate as of: 08/21/15  3:47 PM.  Always use your most recent med list.  carvedilol 12.5 MG tablet  Commonly known as:  COREG  Take 1 tablet (12.5 mg total) by mouth 2 (two) times daily with a meal.     Dulaglutide 0.75 MG/0.5ML Sopn  Commonly known as:  TRULICITY  Inject in the abdominal skin as directed once a week     DULoxetine 60 MG capsule  Commonly known as:  CYMBALTA  Take 2 capsules (120 mg total) by mouth daily.     fenofibrate 160 MG tablet  Take 1 tablet (160 mg total) by mouth daily.     gabapentin 300 MG capsule  Commonly known as:  NEURONTIN  Take 1 capsule (300 mg total) by mouth 3 (three) times daily.     glimepiride 4 MG tablet  Commonly known as:  AMARYL  Take 1 tablet (4 mg total) by mouth daily before breakfast.     glucose blood test strip  Check blood sugar twice daily     HUMIRA 40 MG/0.8ML Pskt  Generic drug:  Adalimumab  Inject into the skin.     Insulin Glargine 100 UNIT/ML Solostar Pen  Commonly known as:  BASAGLAR KWIKPEN  Inject 15 Units into the skin daily at  10 pm. Increase 4 units every 2 days until blood sugars are 150 or less.     insulin lispro 100 UNIT/ML KiwkPen  Commonly known as:  HUMALOG KWIKPEN  Inject 5 units before breakfast, 5 units before lunch and 10 units before dinner     Insulin Pen Needle 32G X 4 MM Misc  To use with Basaglar     loratadine 10 MG tablet  Commonly known as:  CLARITIN  Take 1 tablet (10 mg total) by mouth daily.     meclizine 25 MG tablet  Commonly known as:  ANTIVERT  Take 1 tablet (25 mg total) by mouth 3 (three) times daily as needed for dizziness or nausea.     omeprazole 40 MG capsule  Commonly known as:  PRILOSEC  Take 1 capsule (40 mg total) by mouth daily.     ondansetron 4 MG tablet  Commonly known as:  ZOFRAN  Take 1 tablet (4 mg total) by mouth every 6 (six) hours.     sildenafil 20 MG tablet  Commonly known as:  REVATIO  Take 2-3 tablets (40-60 mg total) by mouth daily as needed.     sitaGLIPtin 100 MG tablet  Commonly known as:  JANUVIA  Take 50 mg by mouth daily.     zolpidem 5 MG tablet  Commonly known as:  AMBIEN  Take 1 tablet (5 mg total) by mouth at bedtime as needed for sleep.        Allergies:  Allergies  Allergen Reactions  . Morphine And Related Other (See Comments)    Hallucinations, back in the 80s. States has taken vicodin before w/o problems   . Sulfa Drugs Cross Reactors Rash  . Hydrocodone-Homatropine Other (See Comments)    Depressed feeling    Past Medical History  Diagnosis Date  . Hypertension   . Diabetes mellitus with neuropathy ([place])     borderline per primary MD  . Stomach ulcer   . Acid reflux   . Kidney stone   . Post-traumatic hydrocephalus     s/p shunts x 2 (first got infected )  . Psoriasis     sees Dr [name]  . Eczema   . Diverticulitis 03/2013  . Testosterone deficiency 04/28/2011  . Insomnia 04/26/2013  . Depression  on cymbalta  . Chronic headaches     on cymbalta  . Psoriatic arthritis ([place])   . Scapholunate  advanced collapse of left wrist 04/2015    see's Dr.Ortmann    Past Surgical History  Procedure Laterality Date  . Total hip arthroplasty Left 2011  . Shoulder surgery Left 2010  . Back surgery  1980  . Ventriculoperitoneal shunt  2007    x2    Family History  Problem Relation Age of Onset  . Lung cancer Father     alive, former smoker   . Heart disease Brother     MI age 50  . Diabetes Neg Hx   . Prostate cancer Neg Hx   . Colon cancer Neg Hx     Social History:  reports that he has never smoked. He has never used smokeless tobacco. He reports that he does not drink alcohol or use illicit drugs.    Review of Systems    Lipid history: Only on fenofibrate for treatment, appears to have had high triglycerides    Lab Results  Component Value Date   CHOL 270* 04/08/2015   HDL 36.30* 04/08/2015   LDLDIRECT 117.0 04/08/2015   TRIG * 04/08/2015    516.0 Triglyceride is over 400; calculations on Lipids are invalid.   CHOLHDL 7 04/08/2015            Most recent eye exam was at least 2 years ago  Most recent foot exam: 07/2015  Review of Systems  Constitutional: Negative for weight loss and reduced appetite.  HENT: Negative for headaches.   Respiratory: Negative for shortness of breath.   Cardiovascular: Negative for chest pain, palpitations and leg swelling.  Gastrointestinal: Negative for constipation and diarrhea.  Endocrine: Positive for erectile dysfunction. Negative for abnormal weight gain, cold intolerance and polydipsia.  Genitourinary: Positive for frequency.  Musculoskeletal: Positive for back pain.  Neurological: Positive for numbness.  Psychiatric/Behavioral:       Depression controlled with Cymbalta      Physical Examination:  BP 118/62 mmHg  Pulse 76  Temp(Src) 98.9 F (37.2 C) (Oral)  Ht 5' 11"  (1.803 m)  Wt 267 lb (121.11 kg)  BMI 37.26 kg/m2  SpO2 96%  GENERAL:         Patient has generalized obesity.   HEENT:         Eye exam shows  normal external appearance. Fundus exam shows no retinopathy. Oral exam shows normal mucosa .  NECK:   There is no lymphadenopathy Thyroid is not enlarged and no nodules felt.  Carotids are normal to palpation and no bruit heard LUNGS:         Chest is symmetrical. Lungs are clear to auscultation.Marland Kitchen   HEART:         Heart sounds:  S1 and S2 are normal. No murmur or click heard., no S3 or S4.   ABDOMEN:   There is no distention present. Liver and spleen are not palpable. No other mass or tenderness present.   NEUROLOGICAL:   Ankle jerks are absent bilaterally.    Diabetic Foot Exam - Simple   No data filed             Vibration sense is  reduced in distal first toes. MUSCULOSKELETAL:  There is no swelling or deformity of the peripheral joints. Spine is normal to inspection.   EXTREMITIES:     There is no edema. No skin lesions present.Marland Kitchen SKIN:  No rash or lesions of concern.        ASSESSMENT:  Diabetes type 2, uncontrolled with BMI 37 His blood sugars escalated significantly after stopping metformin in 12/16, appears to have been better controlled previously Currently his blood sugars are poorly controlled even with taking regimen of insulin but primarily getting a basal insulin with low-dose mealtime coverage.  This indicates he is significantly insulin resistant Appears to have mostly high postprandial readings including today in the office after his first meal He is on insulin only at this time and does need to have significant motor weight loss which she may not achieve without additional pharmacological measures   Complications:?  Neuropathy.  He claims that his neuropathy antedated his diagnosis of diabetes but has no other explanation for his neuropathy  Multiple other medical problems including psoriatic arthritis, sleep apnea and history of obstructive hydrocephalus  PLAN:     Change Basaglar to twice a day for better 24 control  Titrate the dose Basaglar up to 40 units  for now and may need to increase it further if blood sugars consistently high fasting  Increase coverage at breakfast and lunch by 10 units and at suppertime by 5 units at least  Also would consider using the V-go pump depending on his final insulin dosage  Discussed timing and targets of blood sugars at various times  Discussed with the patient the nature of GLP-1 drugs, the action on various organ systems, how they benefit blood glucose control, as well as the benefit of weight loss and  increase satiety . Explained possible side effects especially nausea and vomiting; discussed safety information in package insert. Demonstrated the medication injection device and injection technique to the patient. Discussed injection sites and titration of Trulicity starting with 0.75 mg once a week  until his next visit  Stop Amaryl and Januvia as these are ineffective at this time  He will hold his fenofibrate until renal function improved  Improve diet with avoiding high-fat foods and drinks with sugar   [facility]   Patient Instructions  Change Basaglar to 20 units twice daily on waking up and 8-9 pm  HUMALOG 15 AT LUNCH/Bsft AND 20 AT SUPPER, if eating those meals  Check blood sugars on waking up 3  times a week Also check blood sugars about 2 hours after a meal and do this after different meals by rotation  Recommended blood sugar levels on waking up is 90-130 and about 2 hours after meal is 130-160  Please bring your blood sugar monitor to each visit, thank you  Stop fenofibrate, Januvia and Glimeperide  Start TRULICITY with the pen as shown once weekly on the same day of the week.  You may inject in the stomach, thigh or arm as indicated in the brochure given.   You will feel fullness of the stomach with starting the medication and should try to keep the portions at meals small.  You may experience nausea in the first few  days which usually gets better over time   If any questions or concerns are present call the office or the  [facility] at [phone]. Also visit [email or web address] website for more useful information       Counseling time on subjects discussed above is over 50% of today's 60 minute visit  Rayyan Burley 08/21/2015, 3:47 PM   Note: This office note was prepared with Dragon voice recognition system technology. Any transcriptional errors that result  from this process are unintentional.

## 2015-08-21 NOTE — Patient Instructions (Addendum)
Change Basaglar to 20 units twice daily on waking up and 8-9 pm  HUMALOG 15 AT LUNCH/Bsft AND 20 AT SUPPER, if eating those meals  Check blood sugars on waking up 3  times a week Also check blood sugars about 2 hours after a meal and do this after different meals by rotation  Recommended blood sugar levels on waking up is 90-130 and about 2 hours after meal is 130-160  Please bring your blood sugar monitor to each visit, thank you  Stop fenofibrate, Januvia and Glimeperide  Start TRULICITY with the pen as shown once weekly on the same day of the week.  You may inject in the stomach, thigh or arm as indicated in the brochure given.   You will feel fullness of the stomach with starting the medication and should try to keep the portions at meals small.  You may experience nausea in the first few days which usually gets better over time   If any questions or concerns are present call the office or the  [place] at [phone]. Also visit [email or web address] website for more useful information

## 2015-08-27 DIAGNOSIS — G4733 Obstructive sleep apnea (adult) (pediatric): Secondary | ICD-10-CM | POA: Diagnosis not present

## 2015-08-29 ENCOUNTER — Ambulatory Visit (HOSPITAL_BASED_OUTPATIENT_CLINIC_OR_DEPARTMENT_OTHER)
Admission: RE | Admit: 2015-08-29 | Discharge: 2015-08-29 | Disposition: A | Discharge status: Home / Self Care | Payer: 59 | Source: Ambulatory Visit | Attending: Internal Medicine | Admitting: Internal Medicine

## 2015-08-29 ENCOUNTER — Encounter: Payer: Self-pay | Admitting: Internal Medicine

## 2015-08-29 ENCOUNTER — Ambulatory Visit (INDEPENDENT_AMBULATORY_CARE_PROVIDER_SITE_OTHER): Payer: 59 | Admitting: Internal Medicine

## 2015-08-29 ENCOUNTER — Encounter: Payer: Self-pay | Admitting: Endocrinology

## 2015-08-29 VITALS — BP 128/76 | HR 81 | Temp 98.2°F | Ht 71.0 in | Wt 263.0 lb

## 2015-08-29 DIAGNOSIS — M50323 Other cervical disc degeneration at C6-C7 level: Secondary | ICD-10-CM | POA: Diagnosis not present

## 2015-08-29 DIAGNOSIS — Z09 Encounter for follow-up examination after completed treatment for conditions other than malignant neoplasm: Secondary | ICD-10-CM

## 2015-08-29 DIAGNOSIS — M542 Cervicalgia: Secondary | ICD-10-CM | POA: Insufficient documentation

## 2015-08-29 DIAGNOSIS — M50322 Other cervical disc degeneration at C5-C6 level: Secondary | ICD-10-CM | POA: Insufficient documentation

## 2015-08-29 MED ORDER — TRAMADOL HCL 50 MG PO TABS: 50.0000 mg | ORAL_TABLET | Freq: Three times a day (TID) | ORAL | Status: DC | PRN

## 2015-08-29 MED FILL — traMADol HCL 50 MG TABS: 50 | 30 days supply | Qty: 30 | Fill #0

## 2015-08-29 MED FILL — GABAPENTIN 300 MG CAPSULE: 300 | 30 days supply | Qty: 90 | Fill #6

## 2015-08-29 NOTE — Progress Notes (Signed)
Pre visit review using our clinic review tool, if applicable. No additional management support is needed unless otherwise documented below in the visit note. 

## 2015-08-29 NOTE — Patient Instructions (Signed)
  STOP BY THE FIRST FLOOR:  get the XR     Tylenol  500 mg OTC 2 tabs a day every 8 hours as needed for pain  If tylenol is not helping enough, try Ultram as needed; watch carefully for side effects   Call if not gradually improving, call if you feel worse

## 2015-08-29 NOTE — Progress Notes (Signed)
Subjective:    Patient ID: James [name], male    DOB: Nov 16, 1955, 60 y.o.   MRN: [id number]  DOS:  08/29/2015 Type of visit - description : Acute visit Interval history: Symptoms started approximately 10 days ago with right-sided neck pain, steady but mild, definitely increases depending on how he moves his head, some radiation upwards but no radiation to the shoulder or arms. No recent injury, fall. No upper or lower extremity paresthesias. Gait is at baseline   Review of Systems   Past Medical History  Diagnosis Date  . Hypertension   . Diabetes mellitus with neuropathy ([place])     borderline per primary MD  . Stomach ulcer   . Acid reflux   . Kidney stone   . Post-traumatic hydrocephalus     s/p shunts x 2 (first got infected )  . Psoriasis     sees Dr [name]  . Eczema   . Diverticulitis 03/2013  . Testosterone deficiency 04/28/2011  . Insomnia 04/26/2013  . Depression     on cymbalta  . Chronic headaches     on cymbalta  . Psoriatic arthritis ([place])   . 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 Colon polyps     Past Surgical History  Procedure Laterality Date  . Total hip arthroplasty Left 2011  . Shoulder surgery Left 2010  . Back surgery  1980  . Ventriculoperitoneal shunt  2007    x2    Social History   Social History  . Marital Status: Married    Spouse Name: [name]  . Number of Children: 2  . Years of Education: N/A   Occupational History  . disable     Social History Main Topics  . Smoking status: Never Smoker   . Smokeless tobacco: Never Used  . Alcohol Use: No  . Drug Use: No  . Sexual Activity: Not on file   Other Topics Concern  . Not on file   Social History Narrative   Household-- pt , wife, one children            Medication List       This list is accurate as of: 08/29/15 11:59 PM.  Always use your most recent med list.               carvedilol 12.5 MG tablet  Commonly known as:   COREG  Take 1 tablet (12.5 mg total) by mouth 2 (two) times daily with a meal.     Dulaglutide 0.75 MG/0.5ML Sopn  Commonly known as:  TRULICITY  Inject in the abdominal skin as directed once a week     DULoxetine 60 MG capsule  Commonly known as:  CYMBALTA  Take 2 capsules (120 mg total) by mouth daily.     fenofibrate 160 MG tablet  Take 1 tablet (160 mg total) by mouth daily.     gabapentin 300 MG capsule  Commonly known as:  NEURONTIN  Take 1 capsule (300 mg total) by mouth 3 (three) times daily.     glimepiride 4 MG tablet  Commonly known as:  AMARYL  Take 1 tablet (4 mg total) by mouth daily before breakfast.     glucose blood test strip  Check blood sugar twice daily     HUMIRA 40 MG/0.8ML Pskt  Generic drug:  Adalimumab  Inject into the skin.     Insulin Glargine 100 UNIT/ML Solostar Pen  Commonly known as:  BASAGLAR KWIKPEN  Inject 15 Units into the skin daily at 10 pm. Increase 4 units every 2 days until blood sugars are 150 or less.     insulin lispro 100 UNIT/ML KiwkPen  Commonly known as:  HUMALOG KWIKPEN  Inject 5 units before breakfast, 5 units before lunch and 10 units before dinner     Insulin Pen Needle 32G X 4 MM Misc  To use with Basaglar     loratadine 10 MG tablet  Commonly known as:  CLARITIN  Take 1 tablet (10 mg total) by mouth daily.     meclizine 25 MG tablet  Commonly known as:  ANTIVERT  Take 1 tablet (25 mg total) by mouth 3 (three) times daily as needed for dizziness or nausea.     omeprazole 40 MG capsule  Commonly known as:  PRILOSEC  Take 1 capsule (40 mg total) by mouth daily.     ondansetron 4 MG tablet  Commonly known as:  ZOFRAN  Take 1 tablet (4 mg total) by mouth every 6 (six) hours.     sildenafil 20 MG tablet  Commonly known as:  REVATIO  Take 2-3 tablets (40-60 mg total) by mouth daily as needed.     traMADol 50 MG tablet  Commonly known as:  ULTRAM  Take 1 tablet (50 mg total) by mouth every 8 (eight) hours as  needed.     zolpidem 5 MG tablet  Commonly known as:  AMBIEN  Take 1 tablet (5 mg total) by mouth at bedtime as needed for sleep.           Objective:   Physical Exam BP 128/76 mmHg  Pulse 81  Temp(Src) 98.2 F (36.8 C) (Oral)  Ht 5' 11"  (1.803 m)  Wt 263 lb (119.296 kg)  BMI 36.70 kg/m2  SpO2 95% General:   Well developed, well nourished . NAD.  HEENT:  Normocephalic . Face symmetric, atraumatic Neck: No TTP at the cervical spine. Range of motion: Slightly limited by pain when he tries to hyperextend the neck, also some pain when he moves his head laterally. Lungs:  CTA B Normal respiratory effort, no intercostal retractions, no accessory muscle use. Heart: RRR,  no murmur.  No pretibial edema bilaterally  Skin: Not pale. Not jaundice Neurologic:  alert & oriented X3.  Speech normal, gait appropriate for age and unassisted   DTRs, motor symmetric;  Normal  coordination Psych--  Cognition and judgment appear intact.  Cooperative with normal attention span and concentration.  Behavior appropriate. No anxious or depressed appearing.      Assessment & Plan:   Assessment > DM w/ neuropathy ; rx insulin 08-09-15 HTN Hyperlipidemia: Started fenofibrate 04/09/2015 OSA ,   dx 2012, sleep study again 02-2015 Dr Dohmeier--> severe OSA, rx CPAP Depression, insomnia ----on Cymbalta Chronic headaches -----on Cymbalta MSK: on disability d/t back pain Disability: d/t back pain, HAs Hypogonadism  Dx 2012, normal T 11-2014 (on no RX) GI:  --GERD, diverticulitis 2014, h/o PUD --Fatty liver per [place] 05-2014 (? Of cirrhosis per CT 2014) Psoriasis, psoriatic arthritis -- on HUMIRA  Posttraumatic hydrocephalus s/p 2 shunts (first got infected) H/u urolithiasis +FH CAD brother MI age 64  PLAN Acute neck pain: Neck pain with no evidence of radiculopathy/myelopathy on clinical grounds. Unable to use steroids or NSAIDs. Will try Tylenol, heat/ice . He is intolerant to morphine but  we agreed to try Ultram  with close monitoring of side effects. Get x-ray Call if not improving DM:  Now under the care of endocrinology Renal failure: Recheck a BMP when he comes backs around 09/16/2015

## 2015-08-30 ENCOUNTER — Other Ambulatory Visit: Payer: Self-pay | Admitting: *Deleted

## 2015-08-30 MED ORDER — GLUCOSE BLOOD VI STRP: ORAL_STRIP | Status: DC

## 2015-08-30 MED ORDER — BAYER MICROLET LANCETS MISC: Status: DC

## 2015-08-30 MED FILL — MICROLET LANCETS: 30 days supply | Qty: 100 | Fill #0

## 2015-08-30 MED FILL — CONTOUR NEXT STRIPS: 30 days supply | Qty: 100 | Fill #0

## 2015-08-30 NOTE — Assessment & Plan Note (Signed)
Acute neck pain: Neck pain with no evidence of radiculopathy/myelopathy on clinical grounds. Unable to use steroids or NSAIDs. Will try Tylenol, heat/ice . He is intolerant to morphine but we agreed to try Ultram  with close monitoring of side effects. Get x-ray Call if not improving DM: Now under the care of endocrinology Renal failure: Recheck a BMP when he comes backs around 09/16/2015

## 2015-09-02 ENCOUNTER — Ambulatory Visit (INDEPENDENT_AMBULATORY_CARE_PROVIDER_SITE_OTHER): Payer: 59 | Admitting: Internal Medicine

## 2015-09-02 ENCOUNTER — Other Ambulatory Visit (INDEPENDENT_AMBULATORY_CARE_PROVIDER_SITE_OTHER): Payer: 59

## 2015-09-02 ENCOUNTER — Encounter: Payer: Self-pay | Admitting: Internal Medicine

## 2015-09-02 VITALS — BP 118/78 | HR 76 | Ht 69.29 in | Wt 265.2 lb

## 2015-09-02 DIAGNOSIS — R935 Abnormal findings on diagnostic imaging of other abdominal regions, including retroperitoneum: Secondary | ICD-10-CM | POA: Diagnosis not present

## 2015-09-02 DIAGNOSIS — R945 Abnormal results of liver function studies: Principal | ICD-10-CM

## 2015-09-02 DIAGNOSIS — R799 Abnormal finding of blood chemistry, unspecified: Secondary | ICD-10-CM | POA: Diagnosis not present

## 2015-09-02 DIAGNOSIS — K7469 Other cirrhosis of liver: Secondary | ICD-10-CM

## 2015-09-02 DIAGNOSIS — K7460 Unspecified cirrhosis of liver: Secondary | ICD-10-CM | POA: Diagnosis not present

## 2015-09-02 DIAGNOSIS — R7989 Other specified abnormal findings of blood chemistry: Secondary | ICD-10-CM | POA: Diagnosis not present

## 2015-09-02 LAB — IBC PANEL
IRON: 117 ug/dL (ref 42–165)
SATURATION RATIOS: 30.4 % (ref 20.0–50.0)
TRANSFERRIN: 275 mg/dL (ref 212.0–360.0)

## 2015-09-02 LAB — PROTIME-INR
INR: 1.1 ratio — AB (ref 0.8–1.0)
PROTHROMBIN TIME: 11.6 s (ref 9.6–13.1)

## 2015-09-02 LAB — IRON: Iron: 117 ug/dL (ref 42–165)

## 2015-09-02 LAB — FERRITIN: Ferritin: 116.4 ng/mL (ref 22.0–322.0)

## 2015-09-02 NOTE — Progress Notes (Signed)
HISTORY OF PRESENT ILLNESS:  James [name] is a 60 y.o. male who is sent today by his primary care physician Dr. [name] with chief complaint of new diagnosed hepatic cirrhosis. The patient has not been seen in this office previously but did undergo routine screening colonoscopy in February 2015. He was found to have diverticulosis and a non-adenomatous colon polyp for which follow-up in 10 years was recommended. I have reviewed the patient's available laboratories and x-ray studies in Epic. Briefly, he was seen in the emergency room late December with complaints of left lower quadrant abdominal pain and diarrhea. A CT scan of the abdomen and pelvis was obtained. This revealed diverticulosis without inflammation. Nonspecific mild thickening of jejunal folds. An evidence for hepatic cirrhosis. This was the first time that he became aware of such. He has had intermittent mild elevation of hepatic transaminases (at least back to 2011). Most recent levels were normal. Normal albumin. Last CBC with slightly depressed platelets 143,000. Patient underwent imaging study 2014 which revealed fatty liver. It have liver test abnormalities at that time. Serologies for hepatitis A, B, and C were negative. The patient does not use alcohol. He denies a personal or family history of liver disease. He is accompanied today by his wife. He did use methotrexate for 7 or 8 years about 27 years ago. I'm told that he had liver biopsy for routine monitoring which was unremarkable. He has not been on the drug for 20 years. He does have psoriatic arthritis. Currently on Humira. He is insulin requiring diabetic with poor control currently. He is now seeing the endocrinologist. His last hemoglobin A1c was 10.1. He has been obese for years. Current BMI 39. He does have occasional acid reflux symptoms for which she takes omeprazole with good results. GI review of systems is otherwise negative. No evidence for encephalopathy or issues with edema  on questioning  REVIEW OF SYSTEMS:  All non-GI ROS negative except for visual change, headaches, increased urination  Past Medical History  Diagnosis Date  . Hypertension   . Diabetes mellitus with neuropathy ([place])     borderline per primary MD  . Stomach ulcer   . Acid reflux   . Kidney stone   . Post-traumatic hydrocephalus     s/p shunts x 2 (first got infected )  . Psoriasis     sees Dr [name]  . Eczema   . Diverticulitis 03/2013  . Testosterone deficiency 04/28/2011  . Insomnia 04/26/2013  . Depression     on cymbalta  . Chronic headaches     on cymbalta  . Psoriatic arthritis ([place])   . 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 Colon polyps     Past Surgical History  Procedure Laterality Date  . Total hip arthroplasty Left 2011  . Shoulder surgery Left 2010  . Back surgery  1980  . Ventriculoperitoneal shunt  2007    x2    Social History [place]  reports that he has never smoked. He has never used smokeless tobacco. He reports that he does not drink alcohol or use illicit drugs.  family history includes Heart disease in his brother; Lung cancer in his father. There is no history of Diabetes, Prostate cancer, or Colon cancer.  Allergies  Allergen Reactions  . Morphine And Related Other (See Comments)    Hallucinations, back in the 80s. States has taken vicodin before w/o problems   . Sulfa Drugs Cross Reactors Rash  .  Hydrocodone-Homatropine Other (See Comments)    Depressed feeling       PHYSICAL EXAMINATION: Vital signs: BP 118/78 mmHg  Pulse 76  Ht 5' 9.29" (1.76 m)  Wt 265 lb 4 oz (120.317 kg)  BMI 38.84 kg/m2  Constitutional:Pleasant, obese, generally well-appearing, no acute distress Psychiatric: alert and oriented x3, cooperative Eyes: extraocular movements intact, anicteric, conjunctiva pink Mouth: oral pharynx moist, no lesions Neck: supple without thyromegaly Lymph: no  lymphadenopathy Cardiovascular: heart regular rate and rhythm, no murmur Lungs: clear to auscultation bilaterally Abdomen: soft, obese, nontender, nondistended, no obvious ascites, no peritoneal signs, normal bowel sounds, no organomegaly appreciated Rectal: Omitted. Normal 2015 Extremities: no clubbing cyanosis or lower extremity edema bilaterally Skin: no lesions on visible extremities Neuro: No focal deficits. No asterixis.  ASSESSMENT:  #1. Hepatic cirrhosis on imaging. Suspect secondary to NASH. Negative hepatitis serologies previously. Appears well compensated #2. Chronic elevation of hepatic transaminases. Likely secondary to NASH. Rule out other etiologies #3. Morbid obesity #4. Multiple medical problems including poorly controlled diabetes   PLAN:  #1. Extensive discussion on hepatic cirrhosis, etiologies, complications, and outcomes #2. Discussed fatty liver as this is the most likely etiology #3. Discussed evaluation for other etiologies with blood work. We'll obtain other serologies to evaluate for chronic liver disease as well as prothrombin time and iron studies #4. Stressed the critical importance of exercise and weight loss #5. Repeat screening colonoscopy 2025 #6. Routine GI follow-up 2 months.  A copy of this consultation note has been sent to Dr. [name]

## 2015-09-02 NOTE — Patient Instructions (Signed)
Your physician has requested that you go to the basement for lab work before leaving today  Please follow up on 10/28/2015 at 10:00am

## 2015-09-03 LAB — MITOCHONDRIAL ANTIBODIES

## 2015-09-03 LAB — ANTI-SMOOTH MUSCLE ANTIBODY, IGG: Smooth Muscle Ab: 33 U — ABNORMAL HIGH (ref <20)

## 2015-09-03 LAB — ANA: ANA: NEGATIVE

## 2015-09-04 LAB — ALPHA-1-ANTITRYPSIN: A-1 Antitrypsin, Ser: 161 mg/dL (ref 83–199)

## 2015-09-04 LAB — CERULOPLASMIN: CERULOPLASMIN: 23 mg/dL (ref 18–36)

## 2015-09-11 MED FILL — AMOXICILLIN 500 MG CAPSULE: 500 | 7 days supply | Qty: 28 | Fill #0

## 2015-09-11 MED FILL — OMEPRAZOLE DR 40 MG CAPSULE: 40 | 30 days supply | Qty: 30 | Fill #5

## 2015-09-11 MED FILL — HUMIRA PEN 40 MG/0.8ML PNKT: 40 | 30 days supply | Qty: 2 | Fill #3

## 2015-09-11 MED FILL — DULoxetine HCL 60 MG CPEP: 60 | 30 days supply | Qty: 60 | Fill #5

## 2015-09-11 MED FILL — BD PEN NDL NANO 32GX5/32: 32G X 4 MM | 25 days supply | Qty: 100 | Fill #1

## 2015-09-16 ENCOUNTER — Encounter: Payer: Self-pay | Admitting: Endocrinology

## 2015-09-16 ENCOUNTER — Ambulatory Visit (INDEPENDENT_AMBULATORY_CARE_PROVIDER_SITE_OTHER): Payer: 59 | Admitting: Endocrinology

## 2015-09-16 VITALS — BP 134/84 | HR 72 | Temp 98.9°F | Resp 14 | Ht 69.0 in | Wt 263.4 lb

## 2015-09-16 DIAGNOSIS — E1165 Type 2 diabetes mellitus with hyperglycemia: Secondary | ICD-10-CM

## 2015-09-16 DIAGNOSIS — Z794 Long term (current) use of insulin: Secondary | ICD-10-CM

## 2015-09-16 LAB — BASIC METABOLIC PANEL
BUN: 14 mg/dL (ref 6–23)
CALCIUM: 9.4 mg/dL (ref 8.4–10.5)
CO2: 26 meq/L (ref 19–32)
CREATININE: 1.21 mg/dL (ref 0.40–1.50)
Chloride: 103 mEq/L (ref 96–112)
GFR: 65.57 mL/min (ref >60.00)
GLUCOSE: 108 mg/dL — AB (ref 70–99)
Potassium: 3.8 mEq/L (ref 3.5–5.1)
Sodium: 137 mEq/L (ref 135–145)

## 2015-09-16 NOTE — Progress Notes (Signed)
Patient ID: James [name], male   DOB: 1955/07/01, 60 y.o.   MRN: [id number]           Reason for Appointment:  for Type 2 Diabetes  Referring physician: [name]  History of Present Illness:          Date of diagnosis of type 2 diabetes mellitus:  ?  2014      Background history:  He is not clear how his diabetes was diagnosed, likely on routine testing. Initially had been treated with metformin and also tried on Amaryl Patient had progressive increase in his blood sugars since 1/17 with stopping metformin and being on the regimen of Amaryl and Januvia, he thinks his blood sugars went up to 601.  He was then started on basal bolus insulin   Recent history:   INSULIN regimen is:   Basaglar 30 bid, Humalog 15   before meals     Current management, blood sugar patterns and problems identified:  His insulin doses were increased on his initial consultation because he was having consistently high readings with 30 units of basal insulin and 5-10 units of mealtime doses  He was supposed to take 20 units of Basaglar twice a day but is taking 30 units  Although he has been started on Trulicity does not think it improves his satiety; however his weight is down 3 pounds despite improving blood sugars  He was told to start improving his diet that the avoiding drinks with sugar   His blood sugars have been improving but not consistently after evening meals when they are still over 200  He is not adjusting his mealtime doses based on his meal size pre-meal blood sugar  He thinks he is not able to exercise because of various joint problems, back pain and other issues  No hypoglycemia, his glucose was low normal at supper yesterday because of not eating all day  Non-insulin hypoglycemic drugs the patient is taking are: Trulicity 4.09 mg weekly      Side effects from medications have been: None  Compliance with the medical regimen: Fair  Glucose monitoring:  done  times a day          Glucometer:  Contour Blood Glucose readings by monitor download  Mean values apply above for all meters except median for One Touch  PRE-MEAL Fasting Lunch Dinner Bedtime Overall  Glucose range: 93-336   88-362   228-315    Mean/median: 153    198  196   Self-care: The diet that the patient has been following is: Less sugar .     Meal times are:  Breakfast is periodically skipped.  Lunch: 2-3 PM Dinner: 5-6 PM   Typical meal intake: Breakfast is frequently nothing otherwise bacon and eggs.  He is drinking lemonade, Gatorade and green tea but does not avoid sugar drinks                Dietician visit, most recent:?.  He is being seen by  diabetes educator periodically               Exercise: none   Weight history:  Wt Readings from Last 3 Encounters:  09/16/15 263 lb 6.4 oz (119.477 kg)  09/02/15 265 lb 4 oz (120.317 kg)  08/29/15 263 lb (119.296 kg)    Glycemic control:   Lab Results  Component Value Date   HGBA1C 10.1* 08/07/2015   HGBA1C 6.7* 04/08/2015   HGBA1C 8.2* 12/05/2014   Lab Results  Component Value Date   CREATININE 1.21 09/16/2015          Medication List       This list is accurate as of: 09/16/15  9:03 PM.  Always use your most recent med list.               amoxicillin 500 MG capsule  Commonly known as:  AMOXIL     BAYER MICROLET LANCETS lancets  Use as instructed to check blood sugar 3 times per day dx code E11.9     carvedilol 12.5 MG tablet  Commonly known as:  COREG  Take 1 tablet (12.5 mg total) by mouth 2 (two) times daily with a meal.     Dulaglutide 0.75 MG/0.5ML Sopn  Commonly known as:  TRULICITY  Inject in the abdominal skin as directed once a week     DULoxetine 60 MG capsule  Commonly known as:  CYMBALTA  Take 2 capsules (120 mg total) by mouth daily.     fenofibrate 160 MG tablet  Take 1 tablet (160 mg total) by mouth daily.     gabapentin 300 MG capsule  Commonly known as:  NEURONTIN  Take 1 capsule (300 mg  total) by mouth 3 (three) times daily.     glimepiride 4 MG tablet  Commonly known as:  AMARYL  Take 1 tablet (4 mg total) by mouth daily before breakfast.     glucose blood test strip  Commonly known as:  BAYER CONTOUR NEXT TEST  Use as instructed to check blood sugar 3 times per day dx code E11.9     HUMIRA 40 MG/0.8ML Pskt  Generic drug:  Adalimumab  Inject into the skin.     HUMIRA PEN 40 MG/0.8ML Pnkt  Generic drug:  Adalimumab     Insulin Glargine 100 UNIT/ML Solostar Pen  Commonly known as:  BASAGLAR KWIKPEN  Inject 15 Units into the skin daily at 10 pm. Increase 4 units every 2 days until blood sugars are 150 or less.     insulin lispro 100 UNIT/ML KiwkPen  Commonly known as:  HUMALOG KWIKPEN  Inject 5 units before breakfast, 5 units before lunch and 10 units before dinner     Insulin Pen Needle 32G X 4 MM Misc  To use with Basaglar     loratadine 10 MG tablet  Commonly known as:  CLARITIN  Take 1 tablet (10 mg total) by mouth daily.     meclizine 25 MG tablet  Commonly known as:  ANTIVERT  Take 1 tablet (25 mg total) by mouth 3 (three) times daily as needed for dizziness or nausea.     omeprazole 40 MG capsule  Commonly known as:  PRILOSEC  Take 1 capsule (40 mg total) by mouth daily.     ondansetron 4 MG tablet  Commonly known as:  ZOFRAN  Take 1 tablet (4 mg total) by mouth every 6 (six) hours.     sildenafil 20 MG tablet  Commonly known as:  REVATIO  Take 2-3 tablets (40-60 mg total) by mouth daily as needed.     traMADol 50 MG tablet  Commonly known as:  ULTRAM  Take 1 tablet (50 mg total) by mouth every 8 (eight) hours as needed.     zolpidem 5 MG tablet  Commonly known as:  AMBIEN  Take 1 tablet (5 mg total) by mouth at bedtime as needed for sleep.        Allergies:  Allergies  Allergen Reactions  .  Morphine And Related Other (See Comments)    Hallucinations, back in the 80s. States has taken vicodin before w/o problems   . Sulfa Drugs  Cross Reactors Rash  . Hydrocodone-Homatropine Other (See Comments)    Depressed feeling    Past Medical History  Diagnosis Date  . Hypertension   . Diabetes mellitus with neuropathy ([place])     borderline per primary MD  . Stomach ulcer   . Acid reflux   . Kidney stone   . Post-traumatic hydrocephalus     s/p shunts x 2 (first got infected )  . Psoriasis     sees Dr [name]  . Eczema   . Diverticulitis 03/2013  . Testosterone deficiency 04/28/2011  . Insomnia 04/26/2013  . Depression     on cymbalta  . Chronic headaches     on cymbalta  . Psoriatic arthritis ([place])   . 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 Colon polyps     Past Surgical History  Procedure Laterality Date  . Total hip arthroplasty Left 2011  . Shoulder surgery Left 2010  . Back surgery  1980  . Ventriculoperitoneal shunt  2007    x2    Family History  Problem Relation Age of Onset  . Lung cancer Father     alive, former smoker   . Heart disease Brother     MI age 34  . Diabetes Neg Hx   . Prostate cancer Neg Hx   . Colon cancer Neg Hx     Social History:  reports that he has never smoked. He has never used smokeless tobacco. He reports that he does not drink alcohol or use illicit drugs.    Review of Systems    Lipid history: Only on fenofibrate for treatment, appears to have had high triglycerides    Lab Results  Component Value Date   CHOL 270* 04/08/2015   HDL 36.30* 04/08/2015   LDLDIRECT 117.0 04/08/2015   TRIG * 04/08/2015    516.0 Triglyceride is over 400; calculations on Lipids are invalid.   CHOLHDL 7 04/08/2015            Most recent eye exam was at least 2 years ago  Most recent foot exam: 07/2015  Review of Systems    Physical Examination:  BP 134/84 mmHg  Pulse 72  Temp(Src) 98.9 F (37.2 C)  Resp 14  Ht 5' 9"  (1.753 m)  Wt 263 lb 6.4 oz (119.477 kg)  BMI 38.88 kg/m2  SpO2 94%      ASSESSMENT:  Diabetes type 2,  uncontrolled with BMI 39 See history of present illness for detailed discussion of current diabetes management, blood sugar patterns and problems identified  His blood sugars are gradually improving with increasing his insulin along with Trulicity 5.36 mg He is also doing a little better with diet but has lost only 3 pounds He had a marked increase in blood sugars with stopping metformin earlier this year for renal dysfunction Currently he is very reluctant to continue multiple injection doses  He has not had any formal meal planning instructions from her dietitian and needs to do this especially with need for weight loss  PLAN:    Check electrolytes and renal function and consider metformin again  Increase Humalog 5 units at suppertime as readings are the highest after supper  Increase Trulicity to 1.5 mg weekly  If morning sugars starts coming down below 90 reduce the [employer]  down to 25 units in the evening  Continue to improve diet  Discussed timing of glucose monitoring and blood sugar targets  If unable to taper off his insulin will consider V-go pump  Needs follow-up lipids when blood sugars are better controlled for significant hypertriglyceridemia  Consultation with dietitian   Patient Instructions  HUMALOG 15 AT LUNCH/Bsft AND 20 AT SUPPER  Basaglar 30 as before but if am sugar <90 reduce pm dose to 25    Counseling time on subjects discussed above is over 50% of today's 25 minute visit  Rosser [place] 09/16/2015, 9:03 PM   Note: This office note was prepared with Estate agent. Any transcriptional errors that result from this process are unintentional.

## 2015-09-16 NOTE — Patient Instructions (Signed)
HUMALOG 15 AT LUNCH/Bsft AND 20 AT SUPPER  Basaglar 30 as before but if am sugar <90 reduce pm dose to 25

## 2015-09-16 NOTE — Progress Notes (Signed)
Quick Note:  Please let patient know that the kidney function is normal, start metformin 1 g twice a day and reduce all insulin doses by 5 units, to taper off slowly. Will still need to change Trulicity to 1.5 mg weekly instead of 0.75 ______

## 2015-09-23 DIAGNOSIS — E119 Type 2 diabetes mellitus without complications: Secondary | ICD-10-CM | POA: Diagnosis not present

## 2015-09-23 DIAGNOSIS — H04123 Dry eye syndrome of bilateral lacrimal glands: Secondary | ICD-10-CM | POA: Diagnosis not present

## 2015-09-24 ENCOUNTER — Encounter: Payer: Self-pay | Admitting: Internal Medicine

## 2015-09-24 ENCOUNTER — Encounter: Payer: Self-pay | Admitting: *Deleted

## 2015-09-24 ENCOUNTER — Ambulatory Visit (INDEPENDENT_AMBULATORY_CARE_PROVIDER_SITE_OTHER): Payer: 59 | Admitting: Internal Medicine

## 2015-09-24 ENCOUNTER — Other Ambulatory Visit: Payer: Self-pay | Admitting: *Deleted

## 2015-09-24 VITALS — BP 126/74 | HR 71 | Temp 97.9°F | Ht 69.0 in | Wt 263.0 lb

## 2015-09-24 DIAGNOSIS — Z09 Encounter for follow-up examination after completed treatment for conditions other than malignant neoplasm: Secondary | ICD-10-CM | POA: Diagnosis not present

## 2015-09-24 DIAGNOSIS — N182 Chronic kidney disease, stage 2 (mild): Secondary | ICD-10-CM | POA: Diagnosis not present

## 2015-09-24 DIAGNOSIS — E1122 Type 2 diabetes mellitus with diabetic chronic kidney disease: Secondary | ICD-10-CM | POA: Diagnosis not present

## 2015-09-24 MED ORDER — GABAPENTIN 600 MG PO TABS: 600.0000 mg | ORAL_TABLET | Freq: Three times a day (TID) | ORAL | Status: DC

## 2015-09-24 MED ORDER — DULAGLUTIDE 1.5 MG/0.5ML ~~LOC~~ SOAJ: SUBCUTANEOUS | Status: DC

## 2015-09-24 MED ORDER — METFORMIN HCL 1000 MG PO TABS: 1000.0000 mg | ORAL_TABLET | Freq: Two times a day (BID) | ORAL | Status: DC

## 2015-09-24 MED FILL — NATEGLINIDE 60 MG TABLET: 60 | 30 days supply | Qty: 30 | Fill #5

## 2015-09-24 MED FILL — GABAPENTIN 600 MG TABLET: 600 | 90 days supply | Qty: 270 | Fill #0

## 2015-09-24 NOTE — Progress Notes (Signed)
Subjective:    Patient ID: James [name], male    DOB: 11-28-1955, 60 y.o.   MRN: [id number]  DOS:  09/24/2015 Type of visit - description : check up Interval history: His main concern today is neuropathy, on gabapentin 3 times a day, continue with symptoms described as numbness, "walking on thick socks or  on  top of cotton" Note from endocrinology reviewed, last creatinine 1.2, was recommended to go back on metformin and reduce insulin however the patient has not received the instructions.  Had his eyes checked yesterday. Reports exam was normal  Review of Systems Was recently seen with neck pain: Resolved spontaneously. Denies any back pain or rash  Past Medical History  Diagnosis Date  . Hypertension   . Diabetes mellitus with neuropathy ([place])     borderline per primary MD  . Stomach ulcer   . Acid reflux   . Kidney stone   . Post-traumatic hydrocephalus     s/p shunts x 2 (first got infected )  . Psoriasis     sees Dr [name]  . Eczema   . Diverticulitis 03/2013  . Testosterone deficiency 04/28/2011  . Insomnia 04/26/2013  . Depression     on cymbalta  . Chronic headaches     on cymbalta  . Psoriatic arthritis ([place])   . 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 Colon polyps     Past Surgical History  Procedure Laterality Date  . Total hip arthroplasty Left 2011  . Shoulder surgery Left 2010  . Back surgery  1980  . Ventriculoperitoneal shunt  2007    x2    Social History   Social History  . Marital Status: Married    Spouse Name: James [name]  . Number of Children: 2  . Years of Education: N/A   Occupational History  . disable     Social History Main Topics  . Smoking status: Never Smoker   . Smokeless tobacco: Never Used  . Alcohol Use: No  . Drug Use: No  . Sexual Activity: Not on file   Other Topics Concern  . Not on file   Social History Narrative   Household-- pt , wife, one children              Medication List       This list is accurate as of: 09/24/15  5:58 PM.  Always use your most recent med list.               aspirin 81 MG tablet  Take 81 mg by mouth daily.     BAYER MICROLET LANCETS lancets  Use as instructed to check blood sugar 3 times per day dx code E11.9     carvedilol 12.5 MG tablet  Commonly known as:  COREG  Take 1 tablet (12.5 mg total) by mouth 2 (two) times daily with a meal.     Dulaglutide 1.5 MG/0.5ML Sopn  Commonly known as:  TRULICITY  Inject the contents of one pen once per week     DULoxetine 60 MG capsule  Commonly known as:  CYMBALTA  Take 2 capsules (120 mg total) by mouth daily.     fenofibrate 160 MG tablet  Take 1 tablet (160 mg total) by mouth daily.     gabapentin 600 MG tablet  Commonly known as:  NEURONTIN  Take 1 tablet (600 mg total) by mouth 3 (three) times daily.     glimepiride  4 MG tablet  Commonly known as:  AMARYL  Take 1 tablet (4 mg total) by mouth daily before breakfast.     glucose blood test strip  Commonly known as:  BAYER CONTOUR NEXT TEST  Use as instructed to check blood sugar 3 times per day dx code E11.9     HUMIRA 40 MG/0.8ML Pskt  Generic drug:  Adalimumab  Inject into the skin.     Insulin Glargine 100 UNIT/ML Solostar Pen  Commonly known as:  BASAGLAR KWIKPEN  Inject 15 Units into the skin daily at 10 pm. Increase 4 units every 2 days until blood sugars are 150 or less.     insulin lispro 100 UNIT/ML KiwkPen  Commonly known as:  HUMALOG KWIKPEN  Inject 5 units before breakfast, 5 units before lunch and 10 units before dinner     Insulin Pen Needle 32G X 4 MM Misc  To use with Basaglar     loratadine 10 MG tablet  Commonly known as:  CLARITIN  Take 1 tablet (10 mg total) by mouth daily.     meclizine 25 MG tablet  Commonly known as:  ANTIVERT  Take 1 tablet (25 mg total) by mouth 3 (three) times daily as needed for dizziness or nausea.     metFORMIN 1000 MG tablet  Commonly known as:   GLUCOPHAGE  Take 1 tablet (1,000 mg total) by mouth 2 (two) times daily with a meal.     omeprazole 40 MG capsule  Commonly known as:  PRILOSEC  Take 1 capsule (40 mg total) by mouth daily.     ondansetron 4 MG tablet  Commonly known as:  ZOFRAN  Take 1 tablet (4 mg total) by mouth every 6 (six) hours.     sildenafil 20 MG tablet  Commonly known as:  REVATIO  Take 2-3 tablets (40-60 mg total) by mouth daily as needed.     zolpidem 5 MG tablet  Commonly known as:  AMBIEN  Take 1 tablet (5 mg total) by mouth at bedtime as needed for sleep.           Objective:   Physical Exam BP 126/74 mmHg  Pulse 71  Temp(Src) 97.9 F (36.6 C) (Oral)  Ht 5' 9"  (1.753 m)  Wt 263 lb (119.296 kg)  BMI 38.82 kg/m2  SpO2 97% General:   Well developed, well nourished . NAD.  HEENT:  Normocephalic . Face symmetric, atraumatic  DIABETIC FEET EXAM: No lower extremity edema Normal pedal pulses bilaterally Skin normal, nails small and dystrophic Pinprick examination: Patchy numbness  Neurologic:  alert & oriented X3.  Speech normal, gait appropriate for age and unassisted Psych--  Cognition and judgment appear intact.  Cooperative with normal attention span and concentration.  Behavior appropriate. No anxious or depressed appearing.      Assessment & Plan:   Assessment > DM w/ neuropathy (x years, rx bgaba 05-2014) ; rx insulin 08-09-15 HTN Hyperlipidemia: Started fenofibrate 04/09/2015 OSA ,   dx 2012, sleep study again 02-2015 Dr Dohmeier--> severe OSA, rx CPAP Depression, insomnia ----on Cymbalta Chronic headaches -----on Cymbalta MSK: on disability d/t back pain Disability: d/t back pain, HAs Hypogonadism  Dx 2012, normal T 11-2014 (on no RX) GI:  --GERD, diverticulitis 2014, h/o PUD --Fatty liver per [place] 05-2014 (? Of cirrhosis per CT 2014) -- NASH per GI note 08-2015  Psoriasis, psoriatic arthritis -- on HUMIRA  Posttraumatic hydrocephalus s/p 2 shunts (first got  infected) H/u urolithiasis +FH CAD brother MI  age 41  PLAN Acute neck pain: Improved, did not get to use Ultram DM: Letter from endocrinology regards last labs  printed, okay to go back on metformin, reduce  Insulin dose . Diabetic neuropathy: Going on for years, started gabapentin 2015, symptoms not well-controlled. Increase gabapentin to 600 mg 3 times a day; feet care  discussed  Renal failure: Last creatinine 1.2. Improved. RTC 6 months

## 2015-09-24 NOTE — Patient Instructions (Signed)
GO TO THE FRONT DESK Schedule your next appointment for a  Check up When?   6 months   Increase gabapentin to 600 mg 3 times a day, if that is not helping please call the office  Please read carefully the endocrinology letter.    Diabetes and Foot Care Diabetes may cause you to have problems because of poor blood supply (circulation) to your feet and legs. This may cause the skin on your feet to become thinner, break easier, and heal more slowly. Your skin may become dry, and the skin may peel and crack. You may also have nerve damage in your legs and feet causing decreased feeling in them. You may not notice minor injuries to your feet that could lead to infections or more serious problems. Taking care of your feet is one of the most important things you can do for yourself.  HOME CARE INSTRUCTIONS  Wear shoes at all times, even in the house. Do not go barefoot. Bare feet are easily injured.  Check your feet daily for blisters, cuts, and redness. If you cannot see the bottom of your feet, use a mirror or ask someone for help.  Wash your feet with warm water (do not use hot water) and mild soap. Then pat your feet and the areas between your toes until they are completely dry. Do not soak your feet as this can dry your skin.  Apply a moisturizing lotion or petroleum jelly (that does not contain alcohol and is unscented) to the skin on your feet and to dry, brittle toenails. Do not apply lotion between your toes.  Trim your toenails straight across. Do not dig under them or around the cuticle. File the edges of your nails with an emery board or nail file.  Do not cut corns or calluses or try to remove them with medicine.  Wear clean socks or stockings every day. Make sure they are not too tight. Do not wear knee-high stockings since they may decrease blood flow to your legs.  Wear shoes that fit properly and have enough cushioning. To break in new shoes, wear them for just a few hours a  day. This prevents you from injuring your feet. Always look in your shoes before you put them on to be sure there are no objects inside.  Do not cross your legs. This may decrease the blood flow to your feet.  If you find a minor scrape, cut, or break in the skin on your feet, keep it and the skin around it clean and dry. These areas may be cleansed with mild soap and water. Do not cleanse the area with peroxide, alcohol, or iodine.  When you remove an adhesive bandage, be sure not to damage the skin around it.  If you have a wound, look at it several times a day to make sure it is healing.  Do not use heating pads or hot water bottles. They may burn your skin. If you have lost feeling in your feet or legs, you may not know it is happening until it is too late.  Make sure your health care provider performs a complete foot exam at least annually or more often if you have foot problems. Report any cuts, sores, or bruises to your health care provider immediately. SEEK MEDICAL CARE IF:   You have an injury that is not healing.  You have cuts or breaks in the skin.  You have an ingrown nail.  You notice redness on your  legs or feet.  You feel burning or tingling in your legs or feet.  You have pain or cramps in your legs and feet.  Your legs or feet are numb.  Your feet always feel cold. SEEK IMMEDIATE MEDICAL CARE IF:   There is increasing redness, swelling, or pain in or around a wound.  There is a red line that goes up your leg.  Pus is coming from a wound.  You develop a fever or as directed by your health care provider.  You notice a bad smell coming from an ulcer or wound.   This information is not intended to replace advice given to you by your health care provider. Make sure you discuss any questions you have with your health care provider.   Document Released: 06/12/2000 Document Revised: 02/15/2013 Document Reviewed: 11/22/2012 Elsevier Interactive Patient Education  [employer].

## 2015-09-24 NOTE — Progress Notes (Signed)
Pre visit review using our clinic review tool, if applicable. No additional management support is needed unless otherwise documented below in the visit note. 

## 2015-09-24 NOTE — Assessment & Plan Note (Signed)
Acute neck pain: Improved, did not get to use Ultram DM: Letter from endocrinology regards last labs  printed, okay to go back on metformin, reduce  Insulin dose . Diabetic neuropathy: Going on for years, started gabapentin 2015, symptoms not well-controlled. Increase gabapentin to 600 mg 3 times a day; feet care  discussed  Renal failure: Last creatinine 1.2. Improved. RTC 6 months

## 2015-09-25 DIAGNOSIS — G4733 Obstructive sleep apnea (adult) (pediatric): Secondary | ICD-10-CM | POA: Diagnosis not present

## 2015-09-26 ENCOUNTER — Ambulatory Visit: Payer: Medicare Other | Admitting: Internal Medicine

## 2015-10-01 MED FILL — metFORMIN HCL 1000 MG TABS: 1000 | 30 days supply | Qty: 60 | Fill #0

## 2015-10-01 MED FILL — TRULICITY 1.5 MG/0.5 ML PEN: 1.5 | 28 days supply | Qty: 2 | Fill #0

## 2015-10-03 DIAGNOSIS — D225 Melanocytic nevi of trunk: Secondary | ICD-10-CM | POA: Diagnosis not present

## 2015-10-03 DIAGNOSIS — Z79899 Other long term (current) drug therapy: Secondary | ICD-10-CM | POA: Diagnosis not present

## 2015-10-03 DIAGNOSIS — L409 Psoriasis, unspecified: Secondary | ICD-10-CM | POA: Diagnosis not present

## 2015-10-14 DIAGNOSIS — N183 Chronic kidney disease, stage 3 (moderate): Secondary | ICD-10-CM | POA: Diagnosis not present

## 2015-10-14 DIAGNOSIS — I10 Essential (primary) hypertension: Secondary | ICD-10-CM | POA: Diagnosis not present

## 2015-10-14 DIAGNOSIS — K7581 Nonalcoholic steatohepatitis (NASH): Secondary | ICD-10-CM | POA: Diagnosis not present

## 2015-10-17 ENCOUNTER — Other Ambulatory Visit: Payer: Self-pay | Admitting: Internal Medicine

## 2015-10-17 MED FILL — DULoxetine HCL 60 MG CPEP: 60 | 30 days supply | Qty: 60 | Fill #6

## 2015-10-17 MED FILL — HUMIRA PEN 40 MG/0.8ML PNKT: 40 | 30 days supply | Qty: 2 | Fill #4

## 2015-10-17 MED FILL — OMEPRAZOLE DR 40 MG CAPSULE: 40 | 30 days supply | Qty: 30 | Fill #6

## 2015-10-23 NOTE — Telephone Encounter (Signed)
Rx discontinued by ED 06/24/2015 d/t Kidney Function.

## 2015-10-24 ENCOUNTER — Other Ambulatory Visit: Payer: Self-pay | Admitting: *Deleted

## 2015-10-24 ENCOUNTER — Other Ambulatory Visit (INDEPENDENT_AMBULATORY_CARE_PROVIDER_SITE_OTHER): Payer: 59

## 2015-10-24 ENCOUNTER — Other Ambulatory Visit: Payer: 59

## 2015-10-24 ENCOUNTER — Encounter: Payer: Self-pay | Admitting: *Deleted

## 2015-10-24 ENCOUNTER — Encounter: Payer: Self-pay | Admitting: Dietician

## 2015-10-24 ENCOUNTER — Encounter: Payer: 59 | Attending: Endocrinology | Admitting: Dietician

## 2015-10-24 VITALS — BP 120/88 | Ht 69.0 in | Wt 266.0 lb

## 2015-10-24 VITALS — Ht 70.0 in | Wt 266.0 lb

## 2015-10-24 DIAGNOSIS — E1142 Type 2 diabetes mellitus with diabetic polyneuropathy: Secondary | ICD-10-CM

## 2015-10-24 DIAGNOSIS — Z794 Long term (current) use of insulin: Secondary | ICD-10-CM

## 2015-10-24 DIAGNOSIS — E1165 Type 2 diabetes mellitus with hyperglycemia: Secondary | ICD-10-CM | POA: Insufficient documentation

## 2015-10-24 DIAGNOSIS — E118 Type 2 diabetes mellitus with unspecified complications: Secondary | ICD-10-CM

## 2015-10-24 LAB — COMPREHENSIVE METABOLIC PANEL
ALBUMIN: 4 g/dL (ref 3.5–5.2)
ALK PHOS: 73 U/L (ref 39–117)
ALT: 42 U/L (ref 0–53)
AST: 36 U/L (ref 0–37)
BILIRUBIN TOTAL: 0.8 mg/dL (ref 0.2–1.2)
BUN: 20 mg/dL (ref 6–23)
CO2: 25 mEq/L (ref 19–32)
Calcium: 10.1 mg/dL (ref 8.4–10.5)
Chloride: 106 mEq/L (ref 96–112)
Creatinine, Ser: 1.15 mg/dL (ref 0.40–1.50)
GFR: 69.51 mL/min (ref >60.00)
GLUCOSE: 130 mg/dL — AB (ref 70–99)
POTASSIUM: 4.7 meq/L (ref 3.5–5.1)
SODIUM: 138 meq/L (ref 135–145)
TOTAL PROTEIN: 8.2 g/dL (ref 6.0–8.3)

## 2015-10-24 LAB — POCT CBG (FASTING - GLUCOSE)-MANUAL ENTRY: Glucose Fasting, POC: 151 mg/dL — AB (ref 70–99)

## 2015-10-24 LAB — HEMOGLOBIN A1C: HEMOGLOBIN A1C: 7.9 % — AB (ref 4.6–6.5)

## 2015-10-24 NOTE — Progress Notes (Signed)
  Medical Nutrition Therapy:  Appt start time: 0800 end time:  0930.   Assessment:  Primary concerns today: Patient is her alone.  He does not know why he is here or what he wants to learn.  He has Type 2 Diabetes since around 2014.  Other hx includes:  rheumatoid arthritis, psoriasis HTN,, CKD, and GERD.  His GFR is 65.  HgbA1C 10.1% 08/07/15 increased from 6.7 04/08/15.  He is not taking the cholesterol medication.  He takes his other medication all at once every evening.  He states that if he takes it as it is prescribed that he would forget to take it.  He has an appointment with [facility] today after this appointment.  His weight today is 266 lbs which is his highest weight but overall stable for several days.  Patient lives with his wife.  He does the shopping and cooking.  "That way I get what I want."  Wife just had a gastric bypass sleeve.  He is on disability.   Preferred Learning Style:   No preference indicated   Learning Readiness:   Contemplating  MEDICATIONS: see list to include Trulicity, Amaryl, and Metformin   DIETARY INTAKE:  Usual eating pattern includes 1-2 meals and 1 snacks per day.  24-hr recall:  B ( AM): usually skips  Snk ( AM): none  L ( PM): often skips Snk ( PM): none D ( PM): pizza OR steak, potato with butter and sour cream, fruit Snk ( PM): sometimes popsickle or toast with jelly Beverages: powerade, cocoa cola, coolade or lemonade, bottled water, sweet green tea  Usual physical activity: working in the yard.  Neuropathy and hx of hip replacement.  Estimated energy needs: 1800 calories 200 g carbohydrates 113 g protein 60 g fat  Progress Towards Goal(s):  In progress.   Nutritional Diagnosis:  NB-1.1 Food and nutrition-related knowledge deficit As related to balance of carbohydrate, protein and fat.  As evidenced by diet hx and patient report.    Intervention:  Nutrition counseling and diabetes education initiated. Discussed Carb Counting by food  group as method of portion control, reading food labels, and benefits of increased activity. Also discussed basic physiology of Diabetes, target BG ranges pre and post meals, and A1c. He verbalized understanding but did not seem interested in change. He was seen last year by RD.  Rethink your drink! Stay as active as possible.  Find something that you enjoy most days of the week.  Try the pool. Don't skip meals.  Breakfast, Lunch, Dinner daily Be a spreader not a glopper (butter, salad dressing, mayo, and other fats) Aim for 3 Carb Choices per meal (45 grams) +/- 1 either way  Aim for 0-1 Carbs per snack if hungry  Include protein in moderation with your meals and snacks Consider reading food labels for Total Carbohydrate and Fat Grams of foods Consider checking BG at alternate times per day as directed by MD  Consider taking medication as directed by MD  Teaching Method Utilized: see list Visual Auditory Hands on  Handouts given during visit include: Living Well with Diabetes Carb Counting and Food Label handouts Meal Plan Card Snack list  HgbA1C sheet Label reading  Barriers to learning/adherence to lifestyle change: desire to change, physical problems  Demonstrated degree of understanding via:  Teach Back   Monitoring/Evaluation:  Dietary intake, exercise, label reading, and body weight prn.

## 2015-10-24 NOTE — Patient Outreach (Addendum)
[name]) Care Management   10/24/2015  James [name] 14-May-1956 [id number]  James [name] is an 60 y.o. male who presents to the [place] [facility] office for routine Link To Wellness follow up for self management assistance with Type II DM, HTN and hyperlipidemia.  Subjective:  James [name] says he just finished an appointment with [name] RD for DM nutritional management assistance. He says he is feeling better now that he is off insulin and back on Metformin. He says he is now taking Trulicity and denies adverse side effects.He says he is adherent with his medication but continues to take them all at one time in the evening otherwise he skips doses. He said he was sent to the ED by his primary care MD on 2/15 and received hydration and insulin because he had severe hyperglycemia. He relates the high blood sugars to being off Metformin and inefficacy of basal and bolus insulin that Dr. [name] prescribed after he saw him initially on 2/22. James [name] says his current blood sugars are much better with fasting sugars averaging 130. He says he is no longer requiring either insulins. He says he will have labs drawn  at Dr. [name] office today and see him in early  May.  Objective:   Review of Systems  Constitutional: Negative.     Physical Exam  Constitutional: He is oriented to person, place, and time. He appears well-developed and well-nourished.  Respiratory: Effort normal.  Neurological: He is alert and oriented to person, place, and time.  Skin: Skin is warm and dry.  Psychiatric: He has a normal mood and affect. His behavior is normal. Judgment and thought content normal.   Filed Weights   10/24/15 1013  Weight: 266 lb (120.657 kg)   Filed Vitals:   10/24/15 1013  BP: 120/88    Encounter Medications:   Outpatient Encounter Prescriptions as of 10/24/2015  Medication Sig Note  . Adalimumab (HUMIRA) 40 MG/0.8ML PSKT Inject into the  skin. 03/14/2015: Inject every other week  . BAYER MICROLET LANCETS lancets Use as instructed to check blood sugar 3 times per day dx code E11.9   . carvedilol (COREG) 12.5 MG tablet Take 1 tablet (12.5 mg total) by mouth 2 (two) times daily with a meal.   . Dulaglutide (TRULICITY) 1.5 KG/4.0NU SOPN Inject the contents of one pen once per week 10/24/2015: Injects Wednesday  . DULoxetine (CYMBALTA) 60 MG capsule Take 2 capsules (120 mg total) by mouth daily.   . fenofibrate 160 MG tablet Take 1 tablet (160 mg total) by mouth daily.   Marland Kitchen gabapentin (NEURONTIN) 600 MG tablet Take 1 tablet (600 mg total) by mouth 3 (three) times daily.   Marland Kitchen glimepiride (AMARYL) 4 MG tablet Take 1 tablet (4 mg total) by mouth daily before breakfast.   . glucose blood (BAYER CONTOUR NEXT TEST) test strip Use as instructed to check blood sugar 3 times per day dx code E11.9   . metFORMIN (GLUCOPHAGE) 1000 MG tablet Take 1 tablet (1,000 mg total) by mouth 2 (two) times daily with a meal.   . omeprazole (PRILOSEC) 40 MG capsule Take 1 capsule (40 mg total) by mouth daily.   Marland Kitchen aspirin 81 MG tablet Take 81 mg by mouth daily. Reported on 10/24/2015 Says he is not taking so counseled on the role of aspirin therapy with DM  . Insulin Glargine (BASAGLAR KWIKPEN) 100 UNIT/ML Solostar Pen Inject 15 Units into the skin daily at 10  pm. Increase 4 units every 2 days until blood sugars are 150 or less. (Patient not taking: Reported on 10/24/2015)   . insulin lispro (HUMALOG KWIKPEN) 100 UNIT/ML KiwkPen Inject 5 units before breakfast, 5 units before lunch and 10 units before dinner (Patient not taking: Reported on 10/24/2015)   . Insulin Pen Needle 32G X 4 MM MISC To use with Basaglar   . loratadine (CLARITIN) 10 MG tablet Take 1 tablet (10 mg total) by mouth daily. (Patient not taking: Reported on 10/24/2015)   . meclizine (ANTIVERT) 25 MG tablet Take 1 tablet (25 mg total) by mouth 3 (three) times daily as needed for dizziness or nausea.  (Patient not taking: Reported on 09/24/2015)   . ondansetron (ZOFRAN) 4 MG tablet Take 1 tablet (4 mg total) by mouth every 6 (six) hours. (Patient not taking: Reported on 09/24/2015)   . sildenafil (REVATIO) 20 MG tablet Take 2-3 tablets (40-60 mg total) by mouth daily as needed.   . zolpidem (AMBIEN) 5 MG tablet Take 1 tablet (5 mg total) by mouth at bedtime as needed for sleep. (Patient not taking: Reported on 10/24/2015)    No facility-administered encounter medications on file as of 10/24/2015.    Functional Status:   In your present state of health, do you have any difficulty performing the following activities: 10/24/2015 07/25/2015  Hearing? N N  Vision? N N  Difficulty concentrating or making decisions? N N  Walking or climbing stairs? N N  Dressing or bathing? N N  Doing errands, shopping? N N    Fall/Depression Screening:    PHQ 2/9 Scores 10/24/2015 05/07/2015 04/08/2015 03/14/2015 01/28/2015 09/13/2014  PHQ - 2 Score 0 0 0 1 2 0  PHQ- 9 Score - - - - 11 -    Assessment:   Spouse of [employer] employee and Link To Wellness member with increased  Hgb A1C = 10.1% on 08/07/15 but with improving glycemic control as evidenced by self monitored blood sugars ,   Plan:  [facility] CM Care Plan Problem One        Most Recent Value   Care Plan Problem One  Link To Wellness member with Type II DM with significantly worsened glycemic control as evidenced by Hgb A1C= 10.1% on 08/07/15, previous Hgb A1C= 6.7%, also with morbid obesity (BMI= 39.3) and HTN and hyperlipidemia with abnormal lipid profile on 04/14/15    Role Documenting the Problem One  Care Management [place] for Problem One  Active   [facility]N Long Term Goal (31-90 days)  Improved glycemic control as evidenced by Hgb A1C<8.0%, improved lipid profile at next check, ongoing good control of HTN as evidenced by consistent BP readings <140/<90 and no evidence of weight gain or weight loss at next assessment   [facility]N Long Term Goal Start  Date  10/24/15   Interventions for Problem One Long Term Goal  discussed details of  ED visit on 08/14/15 for hyperglycemia and subsequent referral to endocrinologist on 08/21/15, reviewed medications and medication adherence, discussed in detail mechanism of action of Trulicity and reviewed mechanism of action of Metformin, reviewed common side effects of current DM medications, reviewed office visit notes of 3/20 with Dr. [name] and of 3/28 with Dr. [name], discussed today's visit with RD [name] and reinforced nutritional goals set at the appointment, reviewed most recent labs result of 3/20 including improved creatinine and GFR, ensured health maintenance checks are up to date and positive reinforcement given to [facility] for  getting his dilated eye exam on 09/23/15, reviewed upcoming appointments for labs today at Dr. [name] office, with Dr Henrene Pastor on 5/1 related to fatty liver disease, with Dr. [name] on 5/2. with neurologist on 5/8 for OSA , and with Dr. [name] on 5/8, arranged for Link To Wellness follow up in July     RNCM to fax today's office visit note to Dr. [name]. RNCM will meet quarterly and as needed with patient per Link To Wellness program guidelines to assist with Type II DM, HTN and hyperlipidemia self-management and assess patient's progress toward mutually set goals.

## 2015-10-24 NOTE — Patient Instructions (Signed)
Rethink your drink! Stay as active as possible.  Find something that you enjoy most days of the week.  Try the pool. Don't skip meals.  Breakfast, Lunch, Dinner daily Be a spreader not a glopper (butter, salad dressing, mayo, and other fats) Aim for 3 Carb Choices per meal (45 grams) +/- 1 either way  Aim for 0-1 Carbs per snack if hungry  Include protein in moderation with your meals and snacks Consider reading food labels for Total Carbohydrate and Fat Grams of foods Consider checking BG at alternate times per day as directed by MD  Consider taking medication as directed by MD

## 2015-10-25 ENCOUNTER — Telehealth: Payer: Self-pay

## 2015-10-25 ENCOUNTER — Other Ambulatory Visit: Payer: 59

## 2015-10-26 DIAGNOSIS — G4733 Obstructive sleep apnea (adult) (pediatric): Secondary | ICD-10-CM | POA: Diagnosis not present

## 2015-10-28 ENCOUNTER — Encounter: Payer: Self-pay | Admitting: Internal Medicine

## 2015-10-28 ENCOUNTER — Ambulatory Visit (INDEPENDENT_AMBULATORY_CARE_PROVIDER_SITE_OTHER): Payer: 59 | Admitting: Internal Medicine

## 2015-10-28 VITALS — BP 110/86 | HR 76 | Ht 69.0 in | Wt 260.6 lb

## 2015-10-28 DIAGNOSIS — R7989 Other specified abnormal findings of blood chemistry: Secondary | ICD-10-CM

## 2015-10-28 DIAGNOSIS — K7460 Unspecified cirrhosis of liver: Secondary | ICD-10-CM

## 2015-10-28 DIAGNOSIS — R935 Abnormal findings on diagnostic imaging of other abdominal regions, including retroperitoneum: Secondary | ICD-10-CM

## 2015-10-28 DIAGNOSIS — Z23 Encounter for immunization: Secondary | ICD-10-CM

## 2015-10-28 DIAGNOSIS — R945 Abnormal results of liver function studies: Secondary | ICD-10-CM

## 2015-10-28 DIAGNOSIS — K7581 Nonalcoholic steatohepatitis (NASH): Secondary | ICD-10-CM

## 2015-10-28 NOTE — Patient Instructions (Addendum)
You have been scheduled for an endoscopy. Please follow written instructions given to you at your visit today. If you use inhalers (even only as needed), please bring them with you on the day of your procedure. Your physician has requested that you go to www.startemmi.com and enter the access code given to you at your visit today. This web site gives a general overview about your procedure. However, you should still follow specific instructions given to you by our office regarding your preparation for the procedure.   You have been given a Twin rix injection.  You are scheduled for your 2nd injection on 11/07/15 @ 10:00 am.

## 2015-10-28 NOTE — Progress Notes (Signed)
HISTORY OF PRESENT ILLNESS:  James [name] is a 60 y.o. male who was evaluated 09/02/2015 regarding newly diagnosed hepatic cirrhosis on imaging. See that dictation for details. The etiology was suspected to be NASH. Negative hepatitis serologies. Multiple other causes for liver disease were negative except for elevated anti-smooth muscle antibody. The patient has psoriatic arthritis. He continues to be obese with poorly controlled diabetes. No new complaints  REVIEW OF SYSTEMS:  All non-GI ROS negative upon review today  Past Medical History  Diagnosis Date  . Hypertension   . Diabetes mellitus with neuropathy ([place])     borderline per primary MD  . Stomach ulcer   . Acid reflux   . Kidney stone   . Post-traumatic hydrocephalus     s/p shunts x 2 (first got infected )  . Psoriasis     sees Dr [name]  . Eczema   . Diverticulitis 03/2013  . Testosterone deficiency 04/28/2011  . Insomnia 04/26/2013  . Depression     on cymbalta  . Chronic headaches     on cymbalta  . Psoriatic arthritis ([place])   . 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 Colon polyps   . Cirrhosis ([place])   . Elevated LFTs   . Morbid obesity ([place])   . Fatty liver   . Diverticulosis     Past Surgical History  Procedure Laterality Date  . Total hip arthroplasty Left 2011  . Shoulder surgery Left 2010  . Back surgery  1980  . Ventriculoperitoneal shunt  2007    x2    Social History THEO KRUMHOLZ  reports that he has never smoked. He has never used smokeless tobacco. He reports that he does not drink alcohol or use illicit drugs.  family history includes Heart disease in his brother; Lung cancer in his father. There is no history of Diabetes, Prostate cancer, or Colon cancer.  Allergies  Allergen Reactions  . Morphine And Related Other (See Comments)    Hallucinations, back in the 80s. States has taken vicodin before w/o problems   . Sulfa Drugs Cross Reactors  Rash  . Hydrocodone-Homatropine Other (See Comments)    Depressed feeling       PHYSICAL EXAMINATION: Vital signs: BP 110/86 mmHg  Pulse 76  Ht 5' 9"  (1.753 m)  Wt 260 lb 9.6 oz (118.207 kg)  BMI 38.47 kg/m2  Constitutional: Obese but generally well-appearing, no acute distress Psychiatric: alert and oriented x3, cooperative Eyes: extraocular movements intact, anicteric, conjunctiva pink Mouth: oral pharynx moist, no lesions Neck: supple without thyromegaly Lymph: no lymphadenopathy Cardiovascular: heart regular rate and rhythm, no murmur Lungs: clear to auscultation bilaterally Abdomen: soft, obese, nontender, nondistended, no obvious ascites, no peritoneal signs, normal bowel sounds, no organomegaly Rectal: Omitted Extremities: no clubbing cyanosis or lower extremity edema bilaterally Skin: no lesions on visible extremities Neuro: No focal deficits. No asterixis.  ASSESSMENT:  #1. Hepatic cirrhosis likely secondary to NASH. Compensated. Viral hepatitis nave #2. Obesity #3. Diabetes mellitus #4. Psoriatic arthritis on Humira #5. Negative screening colonoscopy 2015  PLAN:  #1. Long discussion today on hepatic cirrhosis. [place] cirrhosis. Complications of cirrhosis. #2. Recommend Twinrix vaccination series. Patient agreeable #3. Schedule screening upper endoscopy to rule out varices. Patient agreeable.The nature of the procedure, as well as the risks, benefits, and alternatives were carefully and thoroughly reviewed with the patient. Ample time for discussion and questions allowed. The patient understood, was satisfied, and agreed to proceed. #4.  Exercise and weight loss #5. Routine GI follow-up one year

## 2015-10-29 ENCOUNTER — Ambulatory Visit (INDEPENDENT_AMBULATORY_CARE_PROVIDER_SITE_OTHER): Payer: 59 | Admitting: Endocrinology

## 2015-10-29 ENCOUNTER — Encounter: Payer: Self-pay | Admitting: Endocrinology

## 2015-10-29 VITALS — BP 122/78 | HR 75 | Temp 98.6°F | Resp 16 | Ht 69.0 in | Wt 261.2 lb

## 2015-10-29 DIAGNOSIS — E1165 Type 2 diabetes mellitus with hyperglycemia: Secondary | ICD-10-CM

## 2015-10-29 NOTE — Progress Notes (Signed)
Patient ID: James [name], male   DOB: 22-Jul-1955, 60 y.o.   MRN: [id number]           Reason for Appointment:  for Type 2 Diabetes  Referring physician: [name]  History of Present Illness:          Date of diagnosis of type 2 diabetes mellitus:  ?  2014      Background history:  He is not clear how his diabetes was diagnosed, likely on routine testing. Initially had been treated with metformin and also tried on Amaryl Patient had progressive increase in his blood sugars since 1/17 with stopping metformin and being on the regimen of Amaryl and Januvia, he thinks his blood sugars went up to 601.  He was then started on basal bolus insulin   Recent history:   INSULIN regimen is:   none Non-insulin hypoglycemic drugs the patient is taking are: Trulicity 2.50 mg weekly,Metformin 2000 mg at dinner       Current management, blood sugar patterns and problems identified:   he was started back on 2000 mg a day In 3/17when his renal function has gone back to normal   He has also been on Trulicity 5.39 mg weekly  Although he was told to gradually reduce his insulin since his sugars were improving he stopped taking this completely a couple of weeks ago.  His blood sugars have been improving and are fairly good including after meals; blood sugars are better than when he was on insulin  Fasting readings may be still slightly high  He has maintained his weight   A1c has improved, now 7.9 compared to 10.1  Side effects from medications have been: None  Compliance with the medical regimen: Fair  Glucose monitoring:  done  times a day         Glucometer:  Contour Blood Glucose readings by monitor download for the last 4 weeks  Mean values apply above for all meters except median for One Touch  PRE-MEAL Fasting Lunch Dinner Bedtime Overall  Glucose range:  100-184  131-184   123-196   Mean/median:  142    150  145    Self-care: The diet that the patient has been following is: Less sugar  .     Meal times are:  Breakfast is periodically skipped.  Lunch: 2-3 PM Dinner: 5-6 PM   Typical meal intake: Breakfast is frequently nothing otherwise bacon and eggs.          Dietician visit, most recent:/2017               Exercise: gardening  Weight history:  Wt Readings from Last 3 Encounters:  10/29/15 261 lb 3.2 oz (118.48 kg)  10/28/15 260 lb 9.6 oz (118.207 kg)  10/24/15 266 lb (120.657 kg)    Glycemic control:   Lab Results  Component Value Date   HGBA1C 7.9* 10/24/2015   HGBA1C 10.1* 08/07/2015   HGBA1C 6.7* 04/08/2015   Lab Results  Component Value Date   CREATININE 1.15 10/24/2015    Lab on 10/24/2015  Component Date Value Ref Range Status  . Sodium 10/24/2015 138  135 - 145 mEq/L Final  . Potassium 10/24/2015 4.7  3.5 - 5.1 mEq/L Final  . Chloride 10/24/2015 106  96 - 112 mEq/L Final  . CO2 10/24/2015 25  19 - 32 mEq/L Final  . Glucose, Bld 10/24/2015 130* 70 - 99 mg/dL Final  . BUN 10/24/2015 20  6 - 23 mg/dL Final  .  Creatinine, Ser 10/24/2015 1.15  0.40 - 1.50 mg/dL Final  . Total Bilirubin 10/24/2015 0.8  0.2 - 1.2 mg/dL Final  . Alkaline Phosphatase 10/24/2015 73  39 - 117 U/L Final  . AST 10/24/2015 36  0 - 37 U/L Final  . ALT 10/24/2015 42  0 - 53 U/L Final  . Total Protein 10/24/2015 8.2  6.0 - 8.3 g/dL Final  . Albumin 10/24/2015 4.0  3.5 - 5.2 g/dL Final  . Calcium 10/24/2015 10.1  8.4 - 10.5 mg/dL Final  . GFR 10/24/2015 69.51  >60.00 mL/min Final  . Hgb A1c MFr Bld 10/24/2015 7.9* 4.6 - 6.5 % Final   Glycemic Control Guidelines for People with Diabetes:Non Diabetic:  <6%Goal of Therapy: <7%Additional Action Suggested:  >8%   Patient Outreach on 10/24/2015  Component Date Value Ref Range Status  . Glucose Fasting, POC 10/24/2015 151* 70 - 99 mg/dL Final   Specimen was not fasting, one hour post conumptio of 16 oz of grapefruit juice approx 46 CHOs         Medication List       This list is accurate as of: 10/29/15  2:38 PM.  Always  use your most recent med list.               aspirin 81 MG tablet  Take 81 mg by mouth daily. Reported on 10/24/2015     BAYER MICROLET LANCETS lancets  Use as instructed to check blood sugar 3 times per day dx code E11.9     carvedilol 12.5 MG tablet  Commonly known as:  COREG  Take 1 tablet (12.5 mg total) by mouth 2 (two) times daily with a meal.     Dulaglutide 1.5 MG/0.5ML Sopn  Commonly known as:  TRULICITY  Inject the contents of one pen once per week     DULoxetine 60 MG capsule  Commonly known as:  CYMBALTA  Take 2 capsules (120 mg total) by mouth daily.     fenofibrate 160 MG tablet  Take 1 tablet (160 mg total) by mouth daily.     gabapentin 600 MG tablet  Commonly known as:  NEURONTIN  Take 1 tablet (600 mg total) by mouth 3 (three) times daily.     glucose blood test strip  Commonly known as:  BAYER CONTOUR NEXT TEST  Use as instructed to check blood sugar 3 times per day dx code E11.9     HUMIRA 40 MG/0.8ML Pskt  Generic drug:  Adalimumab  Inject into the skin.     metFORMIN 1000 MG tablet  Commonly known as:  GLUCOPHAGE  Take 1 tablet (1,000 mg total) by mouth 2 (two) times daily with a meal.     omeprazole 40 MG capsule  Commonly known as:  PRILOSEC  Take 1 capsule (40 mg total) by mouth daily.        Allergies:  Allergies  Allergen Reactions  . Morphine And Related Other (See Comments)    Hallucinations, back in the 80s. States has taken vicodin before w/o problems   . Sulfa Drugs Cross Reactors Rash  . Hydrocodone-Homatropine Other (See Comments)    Depressed feeling    Past Medical History  Diagnosis Date  . Hypertension   . Diabetes mellitus with neuropathy ([place])     borderline per primary MD  . Stomach ulcer   . Acid reflux   . Kidney stone   . Post-traumatic hydrocephalus     s/p shunts x 2 (first got  infected )  . Psoriasis     sees Dr [name]  . Eczema   . Diverticulitis 03/2013  . Testosterone deficiency 04/28/2011  .  Insomnia 04/26/2013  . Depression     on cymbalta  . Chronic headaches     on cymbalta  . Psoriatic arthritis ([place])   . 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 Colon polyps   . Cirrhosis ([place])   . Elevated LFTs   . Morbid obesity ([place])   . Fatty liver   . Diverticulosis     Past Surgical History  Procedure Laterality Date  . Total hip arthroplasty Left 2011  . Shoulder surgery Left 2010  . Back surgery  1980  . Ventriculoperitoneal shunt  2007    x2    Family History  Problem Relation Age of Onset  . Lung cancer Father     alive, former smoker   . Heart disease Brother     MI age 1  . Diabetes Neg Hx   . Prostate cancer Neg Hx   . Colon cancer Neg Hx     Social History:  reports that he has never smoked. He has never used smokeless tobacco. He reports that he does not drink alcohol or use illicit drugs.    Review of Systems    Lipid history: Only on fenofibrate for treatment, appears to have had high triglycerides    Lab Results  Component Value Date   CHOL 270* 04/08/2015   HDL 36.30* 04/08/2015   LDLDIRECT 117.0 04/08/2015   TRIG * 04/08/2015    516.0 Triglyceride is over 400; calculations on Lipids are invalid.   CHOLHDL 7 04/08/2015            Most recent eye exam was at least 2 years ago  Most recent foot exam: 07/2015  Review of Systems    Physical Examination:  BP 122/78 mmHg  Pulse 75  Temp(Src) 98.6 F (37 C)  Resp 16  Ht 5' 9"  (1.753 m)  Wt 261 lb 3.2 oz (118.48 kg)  BMI 38.56 kg/m2  SpO2 95%      ASSESSMENT:  Diabetes type 2, uncontrolled with BMI 39 See history of present illness for detailed discussion of current diabetes management, blood sugar patterns and problems identified  His blood sugars are gradually improving with starting back on metformin and continuing Trulicity He has benefited significantly from adding metformin back even though he is taking all the dosage at suppertime  instead of twice a day He is also doing a little better with diet and starting to be a little more active  does need significant amount of weight loss  PLAN:     continue metformin but change this to lunch and suppertime  Start walking for regular exercise  Consistent diet   check blood sugars at various times as discussed   Patient Instructions  Check blood sugars on waking up   times a week Also check blood sugars about 2 hours after a meal and do this after different meals by rotation  Recommended blood sugar levels on waking up is 90-130 and about 2 hours after meal is 130-160  Please bring your blood sugar monitor to each visit, thank you  Call if sugars go up  Walk 10 miles a week at least  Take Metformin at lunch and supper    Conny Moening 10/29/2015, 2:38 PM   Note: This office note was prepared with Dragon voice recognition  system technology. Any transcriptional errors that result from this process are unintentional.

## 2015-10-29 NOTE — Patient Instructions (Addendum)
Check blood sugars on waking up   times a week Also check blood sugars about 2 hours after a meal and do this after different meals by rotation  Recommended blood sugar levels on waking up is 90-130 and about 2 hours after meal is 130-160  Please bring your blood sugar monitor to each visit, thank you  Call if sugars go up  Walk 10 miles a week at least  Take Metformin at lunch and supper

## 2015-10-31 MED FILL — metFORMIN HCL 1000 MG TABS: 1000 | 30 days supply | Qty: 60 | Fill #1

## 2015-11-04 ENCOUNTER — Ambulatory Visit: Payer: Medicare Other | Admitting: Internal Medicine

## 2015-11-04 ENCOUNTER — Ambulatory Visit: Payer: 59 | Admitting: Adult Health

## 2015-11-05 ENCOUNTER — Encounter: Payer: 59 | Admitting: Internal Medicine

## 2015-11-07 ENCOUNTER — Ambulatory Visit (INDEPENDENT_AMBULATORY_CARE_PROVIDER_SITE_OTHER): Payer: 59 | Admitting: Internal Medicine

## 2015-11-07 DIAGNOSIS — Z23 Encounter for immunization: Secondary | ICD-10-CM

## 2015-11-12 ENCOUNTER — Other Ambulatory Visit: Payer: Self-pay | Admitting: Internal Medicine

## 2015-11-12 MED FILL — HUMIRA PEN 40 MG/0.8ML PNKT: 40 | 30 days supply | Qty: 2 | Fill #5

## 2015-11-12 MED FILL — OMEPRAZOLE DR 40 MG CAPSULE: 40 | 30 days supply | Qty: 30 | Fill #0

## 2015-11-12 MED FILL — CARVEDILOL 12.5 MG TABLET: 12.5 | 30 days supply | Qty: 60 | Fill #0

## 2015-11-12 MED FILL — DULoxetine HCL 60 MG CPEP: 60 | 30 days supply | Qty: 60 | Fill #0

## 2015-11-12 MED FILL — TRULICITY 1.5 MG/0.5 ML PEN: 1.5 | 28 days supply | Qty: 2 | Fill #1

## 2015-11-13 ENCOUNTER — Ambulatory Visit (AMBULATORY_SURGERY_CENTER): Payer: 59 | Admitting: Internal Medicine

## 2015-11-13 ENCOUNTER — Encounter: Payer: Self-pay | Admitting: Internal Medicine

## 2015-11-13 VITALS — BP 127/82 | HR 70 | Temp 98.6°F | Resp 17 | Ht 69.0 in | Wt 260.0 lb

## 2015-11-13 DIAGNOSIS — K7581 Nonalcoholic steatohepatitis (NASH): Secondary | ICD-10-CM | POA: Diagnosis not present

## 2015-11-13 DIAGNOSIS — Z13810 Encounter for screening for upper gastrointestinal disorder: Secondary | ICD-10-CM | POA: Diagnosis not present

## 2015-11-13 DIAGNOSIS — K7460 Unspecified cirrhosis of liver: Secondary | ICD-10-CM

## 2015-11-13 LAB — GLUCOSE, CAPILLARY
Glucose-Capillary: 129 mg/dL — ABNORMAL HIGH (ref 65–99)
Glucose-Capillary: 108 mg/dL — ABNORMAL HIGH (ref 65–99)

## 2015-11-13 MED ORDER — SODIUM CHLORIDE 0.9 % IV SOLN: 500.0000 mL | INTRAVENOUS | Status: DC

## 2015-11-13 NOTE — Op Note (Signed)
[facility] Patient Name: James [name] Procedure Date: 11/13/2015 10:11 AM MRN: [id number] Endoscopist: Docia Chuck. Henrene Pastor , MD Age: 60 Referring MD:  Date of Birth: [birth year] Gender: Male Procedure:                Upper GI endoscopy Indications:              Cirrhosis rule out esophageal varices. Compensated                            NASH cirrhosis Medicines:                Monitored Anesthesia Care Procedure:                Pre-Anesthesia Assessment:                           - Prior to the procedure, a History and Physical                            was performed, and patient medications and                            allergies were reviewed. The patient's tolerance of                            previous anesthesia was also reviewed. The risks                            and benefits of the procedure and the sedation                            options and risks were discussed with the patient.                            All questions were answered, and informed consent                            was obtained. Prior Anticoagulants: The patient has                            taken no previous anticoagulant or antiplatelet                            agents. ASA Grade Assessment: III - A patient with                            severe systemic disease. After reviewing the risks                            and benefits, the patient was deemed in                            satisfactory condition to undergo the procedure.  After obtaining informed consent, the endoscope was                            passed under direct vision. Throughout the                            procedure, the patient's blood pressure, pulse, and                            oxygen saturations were monitored continuously. The                            Model GIF-[id number]) scope was introduced                            through the mouth, and advanced to the second part                    of duodenum. The upper GI endoscopy was                            accomplished without difficulty. The patient                            tolerated the procedure well. Scope In: Scope Out: Findings:                 The esophagus was normal. No varices.                           The stomach was normal.                           The examined duodenum was normal.                           The cardia and gastric fundus were normal on                            retroflexion. Complications:            No immediate complications. Estimated Blood Loss:     Estimated blood loss: none. Impression:               - Normal esophagus.                           - Normal stomach.                           - Normal examined duodenum.                           - No specimens collected. Recommendation:           - Continued weight loss and exercise.                           - Repeat upper endoscopy in 2-3 years for screening  purposes.                           - Return to GI clinic in 1 year, for routine                            follow-up. Docia Chuck. Henrene Pastor, MD 11/13/2015 10:32:01 AM This report has been signed electronically. CC Letter to:             Kathlene November, MD

## 2015-11-13 NOTE — Progress Notes (Signed)
A and O x 3 Report to rn

## 2015-11-13 NOTE — Patient Instructions (Signed)
YOU HAD AN ENDOSCOPIC PROCEDURE TODAY AT [facility]:   Refer to the procedure report that was given to you for any specific questions about what was found during the examination.  If the procedure report does not answer your questions, please call your gastroenterologist to clarify.  If you requested that your care partner not be given the details of your procedure findings, then the procedure report has been included in a sealed envelope for you to review at your convenience later.  YOU SHOULD EXPECT: Some feelings of bloating in the abdomen. Passage of more gas than usual.  Walking can help get rid of the air that was put into your GI tract during the procedure and reduce the bloating. If you had a lower endoscopy (such as a colonoscopy or flexible sigmoidoscopy) you may notice spotting of blood in your stool or on the toilet paper. If you underwent a bowel prep for your procedure, you may not have a normal bowel movement for a few days.  Please Note:  You might notice some irritation and congestion in your nose or some drainage.  This is from the oxygen used during your procedure.  There is no need for concern and it should clear up in a day or so.  SYMPTOMS TO REPORT IMMEDIATELY:    Following upper endoscopy (EGD)  Vomiting of blood or coffee ground material  New chest pain or pain under the shoulder blades  Painful or persistently difficult swallowing  New shortness of breath  Fever of 100F or higher  Black, tarry-looking stools  For urgent or emergent issues, a gastroenterologist can be reached at any hour by calling [phone].   DIET: Your first meal following the procedure should be a small meal and then it is ok to progress to your normal diet. Heavy or fried foods are harder to digest and may make you feel nauseous or bloated.  Likewise, meals heavy in dairy and vegetables can increase bloating.  Drink plenty of fluids but you should avoid alcoholic beverages  for 24 hours.  ACTIVITY:  You should plan to take it easy for the rest of today and you should NOT DRIVE or use heavy machinery until tomorrow (because of the sedation medicines used during the test).    FOLLOW UP: Our staff will call the number listed on your records the next business day following your procedure to check on you and address any questions or concerns that you may have regarding the information given to you following your procedure. If we do not reach you, we will leave a message.  However, if you are feeling well and you are not experiencing any problems, there is no need to return our call.  We will assume that you have returned to your regular daily activities without incident.  If any biopsies were taken you will be contacted by phone or by letter within the next 1-3 weeks.  Please call us at [phone] if you have not heard about the biopsies in 3 weeks.    SIGNATURES/CONFIDENTIALITY: You and/or your care partner have signed paperwork which will be entered into your electronic medical record.  These signatures attest to the fact that that the information above on your After Visit Summary has been reviewed and is understood.  Full responsibility of the confidentiality of this discharge information lies with you and/or your care-partner.  Repeat endoscopy in 2-3 years for screening.  See Dr. Henrene Pastor in one year for follow-up.

## 2015-11-14 ENCOUNTER — Telehealth: Payer: Self-pay

## 2015-11-14 ENCOUNTER — Ambulatory Visit: Payer: 59 | Admitting: Pharmacist

## 2015-11-14 DIAGNOSIS — L4050 Arthropathic psoriasis, unspecified: Secondary | ICD-10-CM

## 2015-11-14 MED ORDER — ADALIMUMAB 40 MG/0.8ML ~~LOC~~ PSKT: 40.0000 mg | PREFILLED_SYRINGE | SUBCUTANEOUS | Status: DC

## 2015-11-14 NOTE — Telephone Encounter (Signed)
  Follow up Call-  Call back number 11/13/2015 08/02/2013  Post procedure Call Back phone  # [phone]  Permission to leave phone message Yes Yes     Patient questions:  Do you have a fever, pain , or abdominal swelling? No. Pain Score  0 *  Have you tolerated food without any problems? Yes.    Have you been able to return to your normal activities? Yes.    Do you have any questions about your discharge instructions: Diet   No. Medications  No. Follow up visit  No.  Do you have questions or concerns about your Care? No.  Actions: * If pain score is 4 or above: No action needed, pain <4.

## 2015-11-14 NOTE — Progress Notes (Signed)
S: Patient presents today to the [facility].  Patient is currently taking Humira for psoriatic arthritis. Patient is managed by Crista Luria for this.   Adherence: denies any missed doses  Dosing: SubQ: 40 mg every other week  Drug-drug interactions:none  Screening: TB test: completed prior to drug initiation Hepatitis: completed prior to drug initiation   Monitoring: S/sx of infection: none CBC: last CBC normal S/sx of hypersensitivity: none S/sx of malignancy: none S/sx of heart failure: no diagnosis of HF  O:     Lab Results  Component Value Date   WBC 6.3 08/14/2015   HGB 13.5 08/14/2015   HCT 39.0 08/14/2015   MCV 86.3 08/14/2015   PLT 143* 08/14/2015      Chemistry      Component Value Date/Time   NA 138 10/24/2015 1132   NA 136* 04/04/2015   K 4.7 10/24/2015 1132   CL 106 10/24/2015 1132   CO2 25 10/24/2015 1132   BUN 20 10/24/2015 1132   BUN 28* 04/04/2015   CREATININE 1.15 10/24/2015 1132   CREATININE 1.4* 04/04/2015   GLU 143 04/04/2015      Component Value Date/Time   CALCIUM 10.1 10/24/2015 1132   ALKPHOS 73 10/24/2015 1132   AST 36 10/24/2015 1132   ALT 42 10/24/2015 1132   BILITOT 0.8 10/24/2015 1132       A/P: 1. Medication review: Patient on Humira for psoriatic arthritis and is tolerating it well. Medication reviewed with patient. Humira is a TNF blocking agent indicated for ankylosing spondylitis, Crohn's disease, Hidradenitis suppurativa, psoriatic arthritis, plaque psoriasis, ulcerative colitis, and uveitis. The most common adverse effects are infections, headache, and injection site reactions. There is the possibility of an increased risk of malignancy but it is not well understood if this increased risk is due to there medication or the disease state. There are rare cases of pancytopenia and aplastic anemia. Last CBC was normal and he has his CBC regularly monitored. No suggestions for  change. Patient will follow up with rheumatology as directed.   Nicoletta Ba, PharmD, BCPS, [facility] [phone]

## 2015-11-20 ENCOUNTER — Ambulatory Visit (INDEPENDENT_AMBULATORY_CARE_PROVIDER_SITE_OTHER): Payer: 59 | Admitting: Internal Medicine

## 2015-11-20 DIAGNOSIS — Z23 Encounter for immunization: Secondary | ICD-10-CM | POA: Diagnosis not present

## 2015-11-20 DIAGNOSIS — K7581 Nonalcoholic steatohepatitis (NASH): Principal | ICD-10-CM

## 2015-11-20 DIAGNOSIS — K7460 Unspecified cirrhosis of liver: Secondary | ICD-10-CM

## 2015-11-21 ENCOUNTER — Telehealth: Payer: Self-pay | Admitting: *Deleted

## 2015-11-21 NOTE — Telephone Encounter (Signed)
Completed form mailed to pt's home address, copy sent for scanning. JG//CMA

## 2015-11-25 DIAGNOSIS — G4733 Obstructive sleep apnea (adult) (pediatric): Secondary | ICD-10-CM | POA: Diagnosis not present

## 2015-11-29 NOTE — Telephone Encounter (Signed)
Erroneous Encounter

## 2015-12-02 MED FILL — metFORMIN HCL 1000 MG TABS: 1000 | 30 days supply | Qty: 60 | Fill #2

## 2015-12-17 MED FILL — CONTOUR NEXT STRIPS: 30 days supply | Qty: 100 | Fill #1

## 2015-12-17 MED FILL — OMEPRAZOLE DR 40 MG CAPSULE: 40 | 30 days supply | Qty: 30 | Fill #1

## 2015-12-17 MED FILL — TRULICITY 1.5 MG/0.5 ML PEN: 1.5 | 28 days supply | Qty: 2 | Fill #2

## 2015-12-17 MED FILL — HUMIRA PEN 40 MG/0.8ML PNKT: 40 | 28 days supply | Qty: 2 | Fill #0

## 2015-12-17 MED FILL — DULoxetine HCL 60 MG CPEP: 60 | 30 days supply | Qty: 60 | Fill #1

## 2015-12-17 MED FILL — CARVEDILOL 12.5 MG TABLET: 12.5 | 30 days supply | Qty: 60 | Fill #1

## 2015-12-17 MED FILL — GABAPENTIN 600 MG TABLET: 600 | 90 days supply | Qty: 270 | Fill #1

## 2015-12-26 DIAGNOSIS — G4733 Obstructive sleep apnea (adult) (pediatric): Secondary | ICD-10-CM | POA: Diagnosis not present

## 2016-01-02 ENCOUNTER — Other Ambulatory Visit: Payer: Self-pay | Admitting: *Deleted

## 2016-01-02 VITALS — BP 128/80 | Ht 69.0 in | Wt 262.2 lb

## 2016-01-02 DIAGNOSIS — K7460 Unspecified cirrhosis of liver: Secondary | ICD-10-CM

## 2016-01-02 DIAGNOSIS — K7581 Nonalcoholic steatohepatitis (NASH): Principal | ICD-10-CM

## 2016-01-02 DIAGNOSIS — K7469 Other cirrhosis of liver: Secondary | ICD-10-CM | POA: Insufficient documentation

## 2016-01-02 HISTORY — DX: Unspecified cirrhosis of liver: K74.60

## 2016-01-02 MED FILL — metFORMIN HCL 1000 MG TABS: 1000 | 90 days supply | Qty: 180 | Fill #2

## 2016-01-02 NOTE — Patient Outreach (Addendum)
[facility]) Care Management   01/02/2016  James [name] 1955/12/21 [id number]  James [name] is an 60 y.o. male who presents to the [place] Management office for routine Link To Wellness follow up for self management assistance with Type II DM, HTN and obesity.  Subjective:  James [name] says he is doing OK. No complaints except ongoing bilateral foot pain and numbness. He says he will see Dr. Larose [name] for on 7/14 to discuss increasing his Neurontin. He confirmed that he was seen by Dr. Henrene [name] for non alcoholic liver cirrhosis and received Twinrix injections for hepatitis A and B prevention. He confirms he also had an endoscopy on 5/17 that was normal and no esophageal varices.  He says he continues to go for long periods of time without eating and will check his blood sugar if he does not feel well, the lowest blood sugar in the last month was in the 70's.  Objective:   Review of Systems  Constitutional: Negative.     Physical Exam  Constitutional: He is oriented to person, place, and time. He appears well-developed and well-nourished.  Respiratory: Effort normal.  Neurological: He is alert and oriented to person, place, and time.  Skin: Skin is warm and dry.     Psychiatric: He has a normal mood and affect. His behavior is normal. Judgment and thought content normal.   Filed Weights   01/02/16 1008  Weight: 262 lb 3.2 oz (118.933 kg)   Filed Vitals:   01/02/16 1008  BP: 128/80    Encounter Medications:   Outpatient Encounter Prescriptions as of 01/02/2016  Medication Sig Note  . Adalimumab (HUMIRA) 40 MG/0.8ML PSKT Inject 0.8 mLs (40 mg total) into the skin every 14 (fourteen) days.   Marland Kitchen aspirin 81 MG tablet Take 81 mg by mouth daily. Reported on 10/24/2015   . BAYER MICROLET LANCETS lancets Use as instructed to check blood sugar 3 times per day dx code E11.9   . carvedilol (COREG) 12.5 MG tablet Take 1 tablet (12.5 mg total) by mouth  2 (two) times daily with a meal.   . Dulaglutide (TRULICITY) 1.5 ZS/8.2LM SOPN Inject the contents of one pen once per week 01/02/2016: Injects on Friday  . DULoxetine (CYMBALTA) 60 MG capsule Take 2 capsules (120 mg total) by mouth daily.   Marland Kitchen gabapentin (NEURONTIN) 600 MG tablet Take 1 tablet (600 mg total) by mouth 3 (three) times daily.   Marland Kitchen glucose blood (BAYER CONTOUR NEXT TEST) test strip Use as instructed to check blood sugar 3 times per day dx code E11.9   . metFORMIN (GLUCOPHAGE) 1000 MG tablet Take 1 tablet (1,000 mg total) by mouth 2 (two) times daily with a meal.   . omeprazole (PRILOSEC) 40 MG capsule Take 1 capsule (40 mg total) by mouth daily.   . fenofibrate 160 MG tablet Take 1 tablet (160 mg total) by mouth daily. (Patient not taking: Reported on 01/02/2016) 10/28/2015: Per pharmacy has not filled since February 2017   No facility-administered encounter medications on file as of 01/02/2016.    Functional Status:   In your present state of health, do you have any difficulty performing the following activities: 01/02/2016 10/24/2015  Hearing? N N  Vision? N N  Difficulty concentrating or making decisions? N N  Walking or climbing stairs? N N  Dressing or bathing? N N  Doing errands, shopping? N N    Fall/Depression Screening:    PHQ 2/9 Scores 01/02/2016  10/24/2015 05/07/2015 04/08/2015 03/14/2015 01/28/2015 09/13/2014  PHQ - 2 Score 0 0 0 0 1 2 0  PHQ- 9 Score - - - - - 11 -    Assessment:   Spouse of James [name] employee with Type II DM, HTN and obesity. Meeting treatment targets for HTN. Most recent Hgb A1C= 7.9% on 10/24/15, improved from 10.1% on 08/07/15. Abnormal lipid profile on 1010/16 and taking no medications.  Plan:  [facility] CM Care Plan Problem One        Most Recent Value   Care Plan Problem One  Link To Wellness member with Type II DM with improved glycemic control as evidenced by Hgb A1C= 7.9% on 10/24/15, previously 10.1% on 08/07/15,  also with morbid obesity (BMI= 39.3) and HTN  and meeting treatment targets as evidenced by consistent BP readings <140/<90, and hyperlipidemia with abnormal lipid profile on 04/08/15 and taking no medications   Role Documenting the Problem One  Care Management [place] for Problem One  Active   [facility] Long Term Goal (31-90 days)  Improved glycemic control as evidenced by Hgb A1C<8.0%, improved lipid profile at next check, ongoing good control of HTN as evidenced by consistent BP readings <140/<90 and no evidence of weight gain or weight loss at next assessment   [facility] Long Term Goal Start Date  01/02/16   [facility] Long Term Goal Met Date  01/02/16   Interventions for Problem One Long Term Goal  discussed details of  visit with gastroenterologist for consultation related to non alcoholic cirrhosis, results of endoscopy on 5/17 and result of colonoscopy on 08/02/13,  reviewed medications and medication adherence, discussed in detail mechanism of action of Trulicity and reviewed mechanism of action of Metformin, reviewed common side effects of current DM medications, reviewed office visit notes of 5/2 with Dr. [name], reviewed results of Hgb A1C of 4/27 and congratulated James [name] on improved glycemic control,  ensured health maintenance checks are up to date, reviewed upcoming appointments with Dr. Larose [name] on 01/10/16, will send in basket message to Dr. Larose [name] that James [name] is not taking fenofibrate, appointment for labs on 8/8 and appointment with Dr. [name] on 8/11, arranged for Link To Wellness follow up on 04/16/16     RNCM to fax today's office visit note to Dr. Larose [name]. RNCM will meet quarterly and as needed with patient per Link To Wellness program guidelines to assist with Type II DM, HTN, hyperlipidemia and obesity self-management and assess patient's progress toward mutually set goals. Barrington Ellison RN,CCM,CDE [place] Management Coordinator Link To Wellness Office Phone [phone] Office Fax [phone]

## 2016-01-10 ENCOUNTER — Ambulatory Visit (INDEPENDENT_AMBULATORY_CARE_PROVIDER_SITE_OTHER): Payer: 59 | Admitting: Internal Medicine

## 2016-01-10 ENCOUNTER — Encounter: Payer: Self-pay | Admitting: Internal Medicine

## 2016-01-10 VITALS — BP 128/78 | HR 63 | Temp 97.9°F | Ht 69.0 in | Wt 252.2 lb

## 2016-01-10 DIAGNOSIS — G629 Polyneuropathy, unspecified: Secondary | ICD-10-CM | POA: Diagnosis not present

## 2016-01-10 NOTE — Assessment & Plan Note (Signed)
Neuropathy: Persistent symptoms despite escalating gabapentin dose, currently 600 mg 3 times a day. Suspected sx d/t DM, previous labs negative (see assessment). Plan: Refer to neurology, further labs? NCS? Switch to Elavil? Lyrica? RTC 03-2016 CPX

## 2016-01-10 NOTE — Progress Notes (Signed)
Pre visit review using our clinic review tool, if applicable. No additional management support is needed unless otherwise documented below in the visit note. 

## 2016-01-10 NOTE — Patient Instructions (Signed)
Please try Capsaicin OTC twice a day  Will refer you to a neurologist

## 2016-01-10 NOTE — Progress Notes (Signed)
Subjective:    Patient ID: BRIANNA ESSON, male    DOB: 1955-07-04, 60 y.o.   MRN: [id number]  DOS:  01/10/2016 Type of visit - description : Acute visit Interval history:  Neuropathy symptoms are not well controlled despite taking gabapentin as prescribed Again he described sx as feet  feeling cold, discomfort, from the ankles down bilaterally. No neck pain, back pain, difficulty coordinating his lower extremities. Discomfort is worse when he walks Does not feel like is a  MSK type pain.  Review of Systems No claudication  Past Medical History  Diagnosis Date  . Hypertension   . Diabetes mellitus with neuropathy ([place])     borderline per primary MD  . Stomach ulcer   . Acid reflux   . Kidney stone   . Post-traumatic hydrocephalus     s/p shunts x 2 (first got infected )  . Psoriasis     sees Dr [name]  . Eczema   . Diverticulitis 03/2013  . Testosterone deficiency 04/28/2011  . Insomnia 04/26/2013  . Depression     on cymbalta  . Chronic headaches     on cymbalta  . Psoriatic arthritis ([place])   . 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 Colon polyps   . Cirrhosis ([place])   . Elevated LFTs   . Morbid obesity ([place])   . Fatty liver   . Diverticulosis     Past Surgical History  Procedure Laterality Date  . Total hip arthroplasty Left 2011  . Shoulder surgery Left 2010  . Back surgery  1980  . Ventriculoperitoneal shunt  2007    x2    Social History   Social History  . Marital Status: Married    Spouse Name: [name]  . Number of Children: 2  . Years of Education: N/A   Occupational History  . disable     Social History Main Topics  . Smoking status: Never Smoker   . Smokeless tobacco: Never Used  . Alcohol Use: No  . Drug Use: No  . Sexual Activity: Not on file   Other Topics Concern  . Not on file   Social History Narrative   Household-- pt , wife, one adult son with Down's syndrome, younger son lives in  [place]            Medication List       This list is accurate as of: 01/10/16  5:40 PM.  Always use your most recent med list.               Adalimumab 40 MG/0.8ML Pskt  Commonly known as:  HUMIRA  Inject 0.8 mLs (40 mg total) into the skin every 14 (fourteen) days.     aspirin 81 MG tablet  Take 81 mg by mouth daily. Reported on 10/24/2015     BAYER MICROLET LANCETS lancets  Use as instructed to check blood sugar 3 times per day dx code E11.9     carvedilol 12.5 MG tablet  Commonly known as:  COREG  Take 1 tablet (12.5 mg total) by mouth 2 (two) times daily with a meal.     Dulaglutide 1.5 MG/0.5ML Sopn  Commonly known as:  TRULICITY  Inject the contents of one pen once per week     DULoxetine 60 MG capsule  Commonly known as:  CYMBALTA  Take 2 capsules (120 mg total) by mouth daily.     fenofibrate 160 MG tablet  Take 1 tablet (160 mg total) by mouth daily.     gabapentin 600 MG tablet  Commonly known as:  NEURONTIN  Take 1 tablet (600 mg total) by mouth 3 (three) times daily.     glucose blood test strip  Commonly known as:  BAYER CONTOUR NEXT TEST  Use as instructed to check blood sugar 3 times per day dx code E11.9     metFORMIN 1000 MG tablet  Commonly known as:  GLUCOPHAGE  Take 1 tablet (1,000 mg total) by mouth 2 (two) times daily with a meal.     omeprazole 40 MG capsule  Commonly known as:  PRILOSEC  Take 1 capsule (40 mg total) by mouth daily.           Objective:   Physical Exam BP 128/78 mmHg  Pulse 63  Temp(Src) 97.9 F (36.6 C) (Oral)  Ht 5' 9"  (1.753 m)  Wt 252 lb 4 oz (114.42 kg)  BMI 37.23 kg/m2  SpO2 96% General:   Well developed, well nourished . NAD.  HEENT:  Normocephalic . Face symmetric, atraumatic Lower extremities: Normal pedal pulses Pinprick examination: Decreased sensation distally bilaterally at the plantar aspect of the feet more than at the dorsum. Neurologic:  alert & oriented X3.  Speech normal, gait  appropriate for age and unassisted DTRs symmetric Psych--  Cognition and judgment appear intact.  Cooperative with normal attention span and concentration.  Behavior appropriate. No anxious or depressed appearing.      Assessment & Plan:   Assessment > DM w/ neuropathy (x years, rx gaba 05-2014, w/u 11-2014  RPR neg, vit D-B12-Folic Acid wnl ) ; rx insulin 08-09-15 HTN Hyperlipidemia: Started fenofibrate 04/09/2015 OSA ,   dx 2012, sleep study again 02-2015 Dr Dohmeier--> severe OSA, rx CPAP Depression, insomnia ----on Cymbalta Chronic headaches -----on Cymbalta MSK: on disability d/t back pain Disability: d/t back pain, HAs Hypogonadism  Dx 2012, normal T 11-2014 (on no RX) GI:  --GERD, diverticulitis 2014, h/o PUD --Fatty liver per [place] 05-2014 (? Of cirrhosis per CT 2014) -- NASH per GI note 08-2015  Psoriasis, psoriatic arthritis -- on HUMIRA  Posttraumatic hydrocephalus s/p 2 shunts (first got infected) H/u urolithiasis +FH CAD brother MI age 71  PLAN Neuropathy: Persistent symptoms despite escalating gabapentin dose, currently 600 mg 3 times a day. Suspected sx d/t DM, previous labs negative (see assessment). Plan: Refer to neurology, further labs? NCS? Switch to Elavil? Lyrica? RTC 03-2016 CPX

## 2016-01-15 MED FILL — HUMIRA PEN 40 MG/0.8ML PNKT: 40 | 28 days supply | Qty: 2 | Fill #1

## 2016-01-15 MED FILL — DULoxetine HCL 60 MG CPEP: 60 | 30 days supply | Qty: 60 | Fill #2

## 2016-01-15 MED FILL — TRULICITY 1.5 MG/0.5 ML PEN: 1.5 | 28 days supply | Qty: 2 | Fill #3

## 2016-01-15 MED FILL — CARVEDILOL 12.5 MG TABLET: 12.5 | 30 days supply | Qty: 60 | Fill #2

## 2016-01-15 MED FILL — OMEPRAZOLE DR 40 MG CAPSULE: 40 | 30 days supply | Qty: 30 | Fill #2

## 2016-01-29 ENCOUNTER — Other Ambulatory Visit: Payer: 59

## 2016-02-04 ENCOUNTER — Other Ambulatory Visit (INDEPENDENT_AMBULATORY_CARE_PROVIDER_SITE_OTHER): Payer: 59

## 2016-02-04 DIAGNOSIS — E1165 Type 2 diabetes mellitus with hyperglycemia: Secondary | ICD-10-CM

## 2016-02-04 DIAGNOSIS — R7889 Finding of other specified substances, not normally found in blood: Secondary | ICD-10-CM

## 2016-02-04 LAB — MICROALBUMIN / CREATININE URINE RATIO
Creatinine,U: 180.1 mg/dL
MICROALB/CREAT RATIO: 0.4 mg/g (ref 0.0–30.0)
Microalb, Ur: <0.7 mg/dL (ref 0.0–1.9)

## 2016-02-04 LAB — LDL CHOLESTEROL, DIRECT: LDL DIRECT: 103 mg/dL

## 2016-02-04 LAB — COMPREHENSIVE METABOLIC PANEL
ALT: 29 U/L (ref 0–53)
AST: 25 U/L (ref 0–37)
Albumin: 4.1 g/dL (ref 3.5–5.2)
Alkaline Phosphatase: 76 U/L (ref 39–117)
BILIRUBIN TOTAL: 0.7 mg/dL (ref 0.2–1.2)
BUN: 17 mg/dL (ref 6–23)
CHLORIDE: 104 meq/L (ref 96–112)
CO2: 26 mEq/L (ref 19–32)
CREATININE: 1.14 mg/dL (ref 0.40–1.50)
Calcium: 10.1 mg/dL (ref 8.4–10.5)
GFR: 70.14 mL/min (ref >60.00)
GLUCOSE: 126 mg/dL — AB (ref 70–99)
POTASSIUM: 4.7 meq/L (ref 3.5–5.1)
Sodium: 138 mEq/L (ref 135–145)
TOTAL PROTEIN: 7.9 g/dL (ref 6.0–8.3)

## 2016-02-04 LAB — LIPID PANEL
CHOLESTEROL: 235 mg/dL — AB (ref 0–200)
HDL: 40.9 mg/dL (ref >39.00)
NonHDL: 194.41
TRIGLYCERIDES: 325 mg/dL — AB (ref 0.0–149.0)
Total CHOL/HDL Ratio: 6
VLDL: 65 mg/dL — ABNORMAL HIGH (ref 0.0–40.0)

## 2016-02-04 LAB — HEMOGLOBIN A1C: HEMOGLOBIN A1C: 6.1 % (ref 4.6–6.5)

## 2016-02-07 ENCOUNTER — Ambulatory Visit: Payer: 59 | Admitting: Endocrinology

## 2016-02-11 ENCOUNTER — Ambulatory Visit (INDEPENDENT_AMBULATORY_CARE_PROVIDER_SITE_OTHER): Payer: 59 | Admitting: Endocrinology

## 2016-02-11 ENCOUNTER — Encounter: Payer: Self-pay | Admitting: Endocrinology

## 2016-02-11 VITALS — BP 120/71 | HR 73 | Ht 69.0 in | Wt 259.0 lb

## 2016-02-11 DIAGNOSIS — E1165 Type 2 diabetes mellitus with hyperglycemia: Secondary | ICD-10-CM | POA: Diagnosis not present

## 2016-02-11 NOTE — Patient Instructions (Signed)
Check blood sugars on waking up    Also check blood sugars about 2 hours after a meal and do this after different meals by rotation  Recommended blood sugar levels on waking up is 90-130 and about 2 hours after meal is 130-160  Please bring your blood sugar monitor to each visit, thank you  Change metformin to ER next time

## 2016-02-11 NOTE — Progress Notes (Signed)
Patient ID: James [name], male   DOB: [birth year], 58 y.o.   MRN: [id number]           Reason for Appointment: f/u for Type 2 Diabetes  Referring physician: [name]  History of Present Illness:          Date of diagnosis of type 2 diabetes mellitus:  ?  2014      Background history:  He is not clear how his diabetes was diagnosed, likely on routine testing. Initially had been treated with metformin and also tried on Amaryl Patient had progressive increase in his blood sugars since 1/17 with stopping metformin and being on the regimen of Amaryl and Januvia, he thinks his blood sugars went up to 601.  He was then started on basal bolus insulin   Recent history:   Non-insulin hypoglycemic drugs the patient is taking are: Trulicity 1.5 mg weekly,Metformin 2000 mg at dinner     His A1c is now normal at 6.1 and has  progressively improved this year     Current management, blood sugar patterns and problems identified:   His Trulicity was increased to 1.5 mg to enable better control and some weight loss  He has not had any side effects on this but his weight is down only 2 pounds  He did not bring his monitor for download  He is apparently checking his blood sugars very sporadically and frequently not after meals  He is not doing any programmed exercise except yardwork  He was told to take metformin twice a day but he forgets to take this and takes both tablets at bedtime  Side effects from medications have been: None  Compliance with the medical regimen: Fair  Glucose monitoring:  done  times a day         Glucometer:  Contour Blood Glucose readings by recall  for the last 4 weeks Am 100-120 PC <150  Self-care: The diet that the patient has been following is: Decreased sugar intake .     Meal times are:  Breakfast is periodically skipped.  Lunch: 2-3 PM Dinner: 5-6 PM   Typical meal intake: Breakfast is frequently nothing otherwise bacon and eggs.          Dietician visit, most  recent:09/2015               Exercise: gardening  Weight history:  Wt Readings from Last 3 Encounters:  02/11/16 259 lb (117.5 kg)  01/10/16 252 lb 4 oz (114.4 kg)  01/02/16 262 lb 3.2 oz (118.9 kg)    Glycemic control:   Lab Results  Component Value Date   HGBA1C 6.1 02/04/2016   HGBA1C 7.9 (H) 10/24/2015   HGBA1C 10.1 (H) 08/07/2015   Lab Results  Component Value Date   MICROALBUR <0.7 02/04/2016   CREATININE 1.14 02/04/2016    No visits with results within 1 Week(s) from this visit.  Latest known visit with results is:  Lab on 02/04/2016  Component Date Value Ref Range Status  . Hgb A1c MFr Bld 02/04/2016 6.1  4.6 - 6.5 % Final  . Sodium 02/04/2016 138  135 - 145 mEq/L Final  . Potassium 02/04/2016 4.7  3.5 - 5.1 mEq/L Final  . Chloride 02/04/2016 104  96 - 112 mEq/L Final  . CO2 02/04/2016 26  19 - 32 mEq/L Final  . Glucose, Bld 02/04/2016 126* 70 - 99 mg/dL Final  . BUN 02/04/2016 17  6 - 23 mg/dL Final  . Creatinine,  Ser 02/04/2016 1.14  0.40 - 1.50 mg/dL Final  . Total Bilirubin 02/04/2016 0.7  0.2 - 1.2 mg/dL Final  . Alkaline Phosphatase 02/04/2016 76  39 - 117 U/L Final  . AST 02/04/2016 25  0 - 37 U/L Final  . ALT 02/04/2016 29  0 - 53 U/L Final  . Total Protein 02/04/2016 7.9  6.0 - 8.3 g/dL Final  . Albumin 02/04/2016 4.1  3.5 - 5.2 g/dL Final  . Calcium 02/04/2016 10.1  8.4 - 10.5 mg/dL Final  . GFR 02/04/2016 70.14  >60.00 mL/min Final  . Cholesterol 02/04/2016 235* 0 - 200 mg/dL Final  . Triglycerides 02/04/2016 325.0* 0.0 - 149.0 mg/dL Final  . HDL 02/04/2016 40.90  >39.00 mg/dL Final  . VLDL 02/04/2016 65.0* 0.0 - 40.0 mg/dL Final  . Total CHOL/HDL Ratio 02/04/2016 6   Final  . NonHDL 02/04/2016 194.41   Final  . Microalb, Ur 02/04/2016 <0.7  0.0 - 1.9 mg/dL Final  . Creatinine,U 02/04/2016 180.1  mg/dL Final  . Microalb Creat Ratio 02/04/2016 0.4  0.0 - 30.0 mg/g Final  . Direct LDL 02/04/2016 103.0  mg/dL Final         Medication List         Accurate as of 02/11/16  3:05 PM. Always use your most recent med list.          Adalimumab 40 MG/0.8ML Pskt Commonly known as:  HUMIRA Inject 0.8 mLs (40 mg total) into the skin every 14 (fourteen) days.   aspirin 81 MG tablet Take 81 mg by mouth daily. Reported on 10/24/2015   BAYER MICROLET LANCETS lancets Use as instructed to check blood sugar 3 times per day dx code E11.9   carvedilol 12.5 MG tablet Commonly known as:  COREG Take 1 tablet (12.5 mg total) by mouth 2 (two) times daily with a meal.   Dulaglutide 1.5 MG/0.5ML Sopn Commonly known as:  TRULICITY Inject the contents of one pen once per week   DULoxetine 60 MG capsule Commonly known as:  CYMBALTA Take 2 capsules (120 mg total) by mouth daily.   fenofibrate 160 MG tablet Take 1 tablet (160 mg total) by mouth daily.   gabapentin 600 MG tablet Commonly known as:  NEURONTIN Take 1 tablet (600 mg total) by mouth 3 (three) times daily.   glucose blood test strip Commonly known as:  BAYER CONTOUR NEXT TEST Use as instructed to check blood sugar 3 times per day dx code E11.9   metFORMIN 1000 MG tablet Commonly known as:  GLUCOPHAGE Take 1 tablet (1,000 mg total) by mouth 2 (two) times daily with a meal.   omeprazole 40 MG capsule Commonly known as:  PRILOSEC Take 1 capsule (40 mg total) by mouth daily.       Allergies:  Allergies  Allergen Reactions  . Hydrocodone-Homatropine Other (See Comments)    Depressed feeling  . Morphine And Related Other (See Comments)    Hallucinations, back in the 80s. States has taken vicodin before w/o problems   . Sulfa Drugs Cross Reactors Rash    Past Medical History:  Diagnosis Date  . Acid reflux   . Chronic headaches    on cymbalta  . Cirrhosis ([place])   . Colon polyps   . Depression    on cymbalta  . Diabetes mellitus with neuropathy ([place])    borderline per primary MD  . Diverticulitis 03/2013  . Diverticulosis   . Eczema   . Elevated LFTs   .  Fatty liver   . Hypertension   . Insomnia 04/26/2013  . Kidney stone   . Morbid obesity ([place])   . Post-traumatic hydrocephalus    s/p shunts x 2 (first got infected )  . Psoriasis    sees Dr [name]  . Psoriatic arthritis ([place])   . 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 Stomach ulcer   . Testosterone deficiency 04/28/2011    Past Surgical History:  Procedure Laterality Date  . BACK SURGERY  1980  . SHOULDER SURGERY Left 2010  . TOTAL HIP ARTHROPLASTY Left 2011  . VENTRICULOPERITONEAL SHUNT  2007   x2    Family History  Problem Relation Age of Onset  . Lung cancer Father     alive, former smoker   . Heart disease Brother     MI age 52  . Diabetes Neg Hx   . Prostate cancer Neg Hx   . Colon cancer Neg Hx     Social History:  reports that he has never smoked. He has never used smokeless tobacco. He reports that he does not drink alcohol or use drugs.    Review of Systems    Lipid history: Only on fenofibrate for treatment, appears to have had high triglycerides, still not well controlled    Lab Results  Component Value Date   CHOL 235 (H) 02/04/2016   HDL 40.90 02/04/2016   LDLDIRECT 103.0 02/04/2016   TRIG 325.0 (H) 02/04/2016   CHOLHDL 6 02/04/2016            Most recent eye exam was  2017  Most recent foot exam: 07/2015 He has symptomatic neuropathy and is being referred to the neurologist for continued pain not relieved by gabapentin and Cymbalta large doses  Review of Systems    Physical Examination:  BP 120/71   Pulse 73   Ht 5' 9"  (1.753 m)   Wt 259 lb (117.5 kg)   SpO2 97%   BMI 38.25 kg/m       ASSESSMENT:  Diabetes type 2, uncontrolled with BMI 39 See history of present illness for detailed discussion of current diabetes management, blood sugar patterns and problems identified  His blood sugars are much better as judged by his A1c of 6.1 now He has benefited significantly from maximum dose  metformin and Trulicity Did not bring his monitor and not clear if he is having any postprandial hyperglycemia However has difficulty losing weight and can be more consistent with dietary choices and regular exercise  HYPERLIPIDEMIA: Triglycerides are still high, needs weight loss, may also consider fish oil in addition to fenofibrate  PLAN:    Follow instructions previously given by dietitian  More postprandial readings and bring monitor for download on the next visit  Brisk walking if not able to do much physical activity during the day  No change in medications except switch to metformin ER on the next refill    Patient Instructions  Check blood sugars on waking up    Also check blood sugars about 2 hours after a meal and do this after different meals by rotation  Recommended blood sugar levels on waking up is 90-130 and about 2 hours after meal is 130-160  Please bring your blood sugar monitor to each visit, thank you  Change metformin to ER next time   [facility] 02/11/2016, 3:05 PM   Note: This office note was prepared with Dragon voice recognition system technology. Any transcriptional errors that result  from this process are unintentional.

## 2016-02-14 MED FILL — DULoxetine HCL 60 MG CPEP: 60 | 30 days supply | Qty: 60 | Fill #3

## 2016-02-14 MED FILL — OMEPRAZOLE DR 40 MG CAPSULE: 40 | 30 days supply | Qty: 30 | Fill #3

## 2016-02-14 MED FILL — CARVEDILOL 12.5 MG TABLET: 12.5 | 30 days supply | Qty: 60 | Fill #3

## 2016-03-09 ENCOUNTER — Encounter: Payer: Self-pay | Admitting: Neurology

## 2016-03-09 ENCOUNTER — Ambulatory Visit (INDEPENDENT_AMBULATORY_CARE_PROVIDER_SITE_OTHER): Payer: 59 | Admitting: Neurology

## 2016-03-09 VITALS — BP 124/70 | HR 70 | Ht 70.0 in | Wt 256.0 lb

## 2016-03-09 DIAGNOSIS — E669 Obesity, unspecified: Secondary | ICD-10-CM

## 2016-03-09 DIAGNOSIS — E1142 Type 2 diabetes mellitus with diabetic polyneuropathy: Secondary | ICD-10-CM | POA: Diagnosis not present

## 2016-03-09 DIAGNOSIS — E0842 Diabetes mellitus due to underlying condition with diabetic polyneuropathy: Secondary | ICD-10-CM

## 2016-03-09 NOTE — Progress Notes (Signed)
James [name]   Date: 03/09/16  James [name] MRN: [id number] DOB: 09-Jan-1956   Dear Dr. [name]:  Thank you for your kind referral of James [name] for consultation of neuropathy. Although his history is well known to you, please allow [place] to reiterate it for the purpose of our medical record. The patient was accompanied to the clinic by self.    History of Present Illness: James [name] is a 60 y.o. right-handed Caucasian male with diabetes mellitus, psoriatic arthritis on humira, GERD, post-traumatic hydrocephalus s/p shunt x 2, NASH, depression, morbid obesity, hypertension, hyperlipidemia, and severe OSA presenting for evaluation of neuropathy.    Starting around ~2010, he began having cold and numbness of the toes, which has gradually increased to involve the soles of the feet up to the ankle.  He does not have a lot of tingling or burning. Symptoms are constant and alleviating by nothing.  He currently takes gabapentin 635m TID for neuropathy and Cymbalta 672mfor headaches and depression and does not feel this has helped at all.  His does complain of imbalance and stumbles frequently.  He has suffered 3-4 falls this years without significant injury.  He walks independently.    He denies similar sensation of the hands. No history of alcohol use.  His father, who does not have diabetes, also has neuropathy which he developed in his 5024's His father is living and walks with cane, now 8045years of age.  Out-side paper records, electronic medical record, and images have been reviewed where available and summarized as:  Lab Results  Component Value Date   TSH 1.62 04/08/2015   Lab Results  Component Value Date   HGBA1C 6.1 02/04/2016   Lab Results  Component Value Date   [id number] 4406/01/2015   Lab Results  Component Value Date   FOLATE 14.0 12/05/2014   MRI lumbar spine wo contrast 12/25/2009:  Essentially normal  examination for a patient this age.  No cause of left hip pain identified.  There are very minimal disc bulges in the lower lumbar region and there is mild facet degeneration at L4-5 and L5-S1.  Past Medical History:  Diagnosis Date  . Acid reflux   . Chronic headaches    on cymbalta  . Cirrhosis ([facility]  . Colon polyps   . Depression    on cymbalta  . Diabetes mellitus with neuropathy ([facility]   borderline per primary MD  . Diverticulitis 03/2013  . Diverticulosis   . Eczema   . Elevated LFTs   . Fatty liver   . Hypertension   . Insomnia 04/26/2013  . Kidney stone   . Morbid obesity ([place]  . Post-traumatic hydrocephalus    s/p shunts x 2 (first got infected )  . Psoriasis    sees Dr GrHedy Jacob. Psoriatic arthritis ([facility]  . 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 Stomach ulcer   . Testosterone deficiency 04/28/2011    Past Surgical History:  Procedure Laterality Date  . BACK SURGERY  1980  . SHOULDER SURGERY Left 2010  . TOTAL HIP ARTHROPLASTY Left 2011  . VENTRICULOPERITONEAL SHUNT  2007   x2     Medications:  Outpatient Encounter Prescriptions as of 03/09/2016  Medication Sig Note  . Adalimumab (HUMIRA) 40 MG/0.8ML PSKT Inject 0.8 mLs (40 mg total) into the skin every 14 (fourteen) days.   . carvedilol (  COREG) 12.5 MG tablet Take 1 tablet (12.5 mg total) by mouth 2 (two) times daily with a meal.   . Dulaglutide (TRULICITY) 1.5 YJ/8.5UD SOPN Inject the contents of one pen once per week 01/02/2016: Injects on Friday  . DULoxetine (CYMBALTA) 60 MG capsule Take 2 capsules (120 mg total) by mouth daily.   . fenofibrate 160 MG tablet Take 1 tablet (160 mg total) by mouth daily.   Marland Kitchen gabapentin (NEURONTIN) 600 MG tablet Take 1 tablet (600 mg total) by mouth 3 (three) times daily. (Patient taking differently: Take 1,800 mg by mouth at bedtime. )   . metFORMIN (GLUCOPHAGE) 1000 MG tablet Take 1 tablet (1,000 mg total) by mouth 2 (two) times  daily with a meal.   . omeprazole (PRILOSEC) 40 MG capsule Take 1 capsule (40 mg total) by mouth daily.   Marland Kitchen BAYER MICROLET LANCETS lancets Use as instructed to check blood sugar 3 times per day dx code E11.9 (Patient not taking: Reported on 03/09/2016)   . [DISCONTINUED] aspirin 81 MG tablet Take 81 mg by mouth daily. Reported on 10/24/2015   . [DISCONTINUED] glucose blood (BAYER CONTOUR NEXT TEST) test strip Use as instructed to check blood sugar 3 times per day dx code E11.9    No facility-administered encounter medications on file as of 03/09/2016.      Allergies:  Allergies  Allergen Reactions  . Hydrocodone-Homatropine Other (See Comments)    Depressed feeling  . Morphine And Related Other (See Comments)    Hallucinations, back in the 80s. States has taken vicodin before w/o problems   . Sulfa Drugs Cross Reactors Rash    Family History: Family History  Problem Relation Age of Onset  . Healthy Mother   . Lung cancer Father     alive, former smoker   . Heart disease Brother     MI age 39  . Other Brother     Murdered  . Down syndrome Son   . Diabetes Neg Hx   . Prostate cancer Neg Hx   . Colon cancer Neg Hx     Social History: Social History  Substance Use Topics  . Smoking status: Never Smoker  . Smokeless tobacco: Never Used  . 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    Review of Systems:  CONSTITUTIONAL: No fevers, chills, night sweats, or weight loss.   EYES: No visual changes or eye pain ENT: No hearing changes.  No history of nose bleeds.   RESPIRATORY: No cough, wheezing and shortness of breath.   CARDIOVASCULAR: Negative for chest pain, and palpitations.   GI: Negative for abdominal discomfort, blood in stools or black stools.  No recent change in bowel habits.   GU:   No history of incontinence.   MUSCLOSKELETAL: No history of joint pain or swelling.  No myalgias.   SKIN: Negative for lesions, rash, and itching.   HEMATOLOGY/ONCOLOGY: Negative for prolonged bleeding, bruising easily, and swollen nodes.  No history of cancer.   ENDOCRINE: Negative for cold or heat intolerance, polydipsia or goiter.   PSYCH:  +depression or anxiety symptoms.   NEURO: As Above.   Vital Signs:  BP 124/70 (BP Location: Right Arm, Patient Position: Sitting, Cuff Size: Normal)   Pulse 70   Ht 5' 10"  (1.778 m)  Wt 256 lb (116.1 kg)   BMI 36.73 kg/m  Pain Scale: 0 on a scale of 0-10   General Medical Exam:   General:  Well appearing, comfortable.   Eyes/ENT: see cranial nerve examination.  Skull deformity from previous craniotomy for VP shunt Neck: No masses appreciated.  Full range of motion without tenderness.  No carotid bruits. Respiratory:  Clear to auscultation, good air entry bilaterally.   Cardiac:  Regular rate and rhythm, no murmur.   Extremities:  No deformities, edema, or skin discoloration.  Skin:  No rashes or lesions.  Neurological Exam: MENTAL STATUS including orientation to time, place, person, recent and remote memory, attention span and concentration, language, and fund of knowledge is normal.  Speech is not dysarthric.  CRANIAL NERVES: II:  No visual field defects.  Unremarkable fundi.   III-IV-VI: Pupils equal round and reactive to light.  Normal conjugate, extra-ocular eye movements in all directions of gaze.  No nystagmus.  No ptosis.   V:  Normal facial sensation   VII:  Normal facial symmetry and movements.  No pathologic facial reflexes.  VIII:  Normal hearing and vestibular function.   IX-X:  Normal palatal movement.   XI:  Normal shoulder shrug and head rotation.   XII:  Normal tongue strength and range of motion, no deviation or fasciculation.  MOTOR:  No atrophy, fasciculations or abnormal movements.  No pronator drift.  Tone is  normal.    Right Upper Extremity:    Left Upper Extremity:    Deltoid  5/5   Deltoid  5/5   Biceps  5/5   Biceps  5/5   Triceps  5/5   Triceps  5/5   Wrist extensors  5/5   Wrist extensors  5/5   Wrist flexors  5/5   Wrist flexors  5/5   Finger extensors  5/5   Finger extensors  5/5   Finger flexors  5/5   Finger flexors  5/5   Dorsal interossei  5/5   Dorsal interossei  5/5   Abductor pollicis  5/5   Abductor pollicis  5/5   Tone (Ashworth scale)  0  Tone (Ashworth scale)  0   Right Lower Extremity:    Left Lower Extremity:    Hip flexors  5/5   Hip flexors  5/5   Hip extensors  5/5   Hip extensors  5/5   Knee flexors  5/5   Knee flexors  5/5   Knee extensors  5/5   Knee extensors  5/5   Dorsiflexors  5/5   Dorsiflexors  5/5   Plantarflexors  5/5   Plantarflexors  5/5   Toe extensors  5-/5   Toe extensors  5-/5   Toe flexors  4/5   Toe flexors  4/5   Tone (Ashworth scale)  0  Tone (Ashworth scale)  0   MSRs:  Right                                                                 Left brachioradialis 2+  brachioradialis 2+  biceps 2+  biceps 2+  triceps 2+  triceps 2+  patellar 3+  patellar 3+  ankle jerk 1+  ankle jerk 1+  Hoffman no  Hoffman no  plantar response down  plantar response down   SENSORY:  All sensory modalities are reduced distal to ankles bilaterally.  There is moderate sway with Rhomberg testing.  COORDINATION/GAIT: Normal finger-to- nose-finger.  Intact rapid alternating movements bilaterally.  Able to rise from a chair without using arms.  Gait wide-based and stable. He is unsteady with stressed gait and unable to perform tandem gait.   IMPRESSION: 1.  Distal and symmetric neuropathy due to diabetes.  There are reports of demyelinating neuropathy associated with Humira, but given that his symptoms are chronic without abrupt onset or worsening, the likelihood of a demyelinating neuropathy is low.  He seems to have more negative symptoms (numbness, cold  sensation), than positive (burning, tingling, pain) which is less responsive to medications than burning paresthesias, so it would be reasonable to try to lower his gabapentin to 641m BID to minimize side effects of sedation and weight gain.   He will also have NCS/EMG of the legs to be sure this is axonal neuropathy (non-diabetic father also has neuropathy). Start using a cane for balance.  Fall precautions discussed and literature provided.  2.  Obesity.  Encouraged him to start low-sugar/carb diet and to start exercise program.  Further recommendations will be based on the results of the testing   The duration of this appointment [name] was 45 minutes of face-to-face time with the patient.  Greater than 50% of this time was spent in counseling, explanation of diagnosis, planning of further management, and coordination of care.   Thank you for allowing me to participate in patient's care.  If I can answer any additional questions, I would be pleased to do so.    Sincerely,    Donika K. PPosey Pronto DO

## 2016-03-09 NOTE — Patient Instructions (Signed)
1.  NCS/EMG of the legs 2.  You can try lowering the dose of gabapentin 636m twice daily 3.  Please start using a cane  [facility]  Preventing Falls in the Home   Falls are common, often dreaded events in the lives of older people. Aside from the obvious injuries and even death that may result, falls can cause wide-ranging consequences including loss of independence, mental decline, decreased activity, and mobility. Younger people are also at risk of falling, especially those with chronic illnesses and fatigue.  Ways to reduce the risk for falling:  * Examine diet and medications. Warm foods and alcohol dilate blood vessels, which can lead to dizziness when standing. Sleep aids, antidepressants, and pain medications can also increase the likelihood of a fall.  * Get a vison exam. Poor vision, cataracts, and glaucoma increase the chances of falling.  * Check foot gear. Shoes should fit snugly and have a sturdy, nonskid sole and broad, low heel.  * Participate in a physician-approved exercise program to build and maintain muscle strength and improve balance and coordination.  * Increase vitamin D intake. Vitamin D improves muscle strength and increases the amount of calcium the body is able to absorb and deposit in bones.  How to prevent falls from common hazards:  * Floors - Remove all loose wires, cords, and throw rugs. Minimize clutter. Make sure rugs are anchored and smooth. Keep furniture in its usual place.  * Chairs - Use chairs with straight backs, armrests, and firm seats. Add firm cushions to existing pieces to add height.  * Bathroom - Install grab bars and non-skid tape in the tub or shower. Use a bathtub transfer bench or a shower chair with a back support. Use an elevated toilet seat and/or safety rails to assist standing from a low surface. Do not use towel racks or bathroom tissue holders to help you stand.  * Lighting - Make sure halls, stairways, and entrances are well-lit.  Install a night light in your bathroom or hallway. Make sure there is a light switch at the top and bottom of the staircase. Turn lights on if you get up in the middle of the night. Make sure lamps or light switches are within reach of the bed if you have to get up during the night.  * Kitchen - Install non-skid rubber mats near the sink and stove. Clean spills immediately. Store frequently used utensils, pots, and pans between waist and eye level. This helps prevent reaching and bending. Sit when getting things out of the lower cupboards.  * Living room / BRepublicfurniture with wide spaces in between, giving enough room to move around. Establish a route through the living room that gives you something to hold onto as you walk.  * Stairs - Make sure treads, rails, and rugs are secure. Install a rail on both sides of the stairs. If stairs are a threat, it might be helpful to arrange most of your activities on the lower level to reduce the number of times you must climb the stairs.  * Entrances and doorways - Install metal handles on the walls adjacent to the doorknobs of all doors to make it more secure as you travel through the doorway.  Tips for maintaining balance:  * Keep at least one hand free at all times Try using a backpack or fanny pack to hold things rather than carrying them in your hands. Never carry objects in both hands when walking as  this interferes with keeping your balance.  * Attempt to swing both arms from front to back while walking. This might require a conscious effort if Parkinson's disease has diminished your movement. It will, however, help you to maintain balance and posture, and reduce fatigue.  * Consciously lift your feet off the ground when walking. Shuffling and dragging of the feet is a common culprit in losing your balance.  * When trying to navigate turns, use a "U" technique of facing forward and making a wide turn, rather than pivoting sharply.  * Try to stand  with your feet shoulder-length apart. When your feet are close together for any length of time, you increase your risk of losing your balance and falling.  * Do one thing at a time. Do not try to walk and accomplish another task, such as reading or looking around. The decrease in your automatic reflexes complicates motor function, so the less distraction, the better.  * Do not wear rubber or gripping soled shoes, they might "catch" on the floor and cause tripping.  * Move slowly when changing positions. Use deliberate, concentrated movements and, if needed, use a grab bar or walking aid. Count fifteen (15) seconds after standing to begin walking.  * If balance is a continuous problem, you might want to consider a walking aid such as a cane, walking stick, or walker. Once you have mastered walking with help, you may be ready to try it again on your own.  This information is provided by [facility] and is not intended to replace the medical advice of your physician or other health care providers. Please consult your physician or other health care providers for advice regarding your specific medical condition.

## 2016-03-12 ENCOUNTER — Other Ambulatory Visit: Payer: Self-pay | Admitting: Internal Medicine

## 2016-03-12 ENCOUNTER — Other Ambulatory Visit: Payer: Self-pay | Admitting: Endocrinology

## 2016-03-12 MED FILL — TRULICITY 1.5 MG/0.5 ML PEN: 1.5 | 28 days supply | Qty: 2 | Fill #0

## 2016-03-12 MED FILL — CARVEDILOL 12.5 MG TABLET: 12.5 | 30 days supply | Qty: 60 | Fill #4

## 2016-03-12 MED FILL — DULoxetine HCL 60 MG CPEP: 60 | 30 days supply | Qty: 60 | Fill #4

## 2016-03-12 MED FILL — OMEPRAZOLE DR 40 MG CAPSULE: 40 | 30 days supply | Qty: 30 | Fill #4

## 2016-03-13 ENCOUNTER — Other Ambulatory Visit: Payer: Self-pay | Admitting: Pharmacist

## 2016-03-13 MED ORDER — ADALIMUMAB 40 MG/0.8ML ~~LOC~~ PSKT: 40.0000 mg | PREFILLED_SYRINGE | SUBCUTANEOUS | 0 refills | Status: DC

## 2016-03-13 MED FILL — GABAPENTIN 600 MG TABLET: 600 | 90 days supply | Qty: 270 | Fill #2

## 2016-03-16 ENCOUNTER — Other Ambulatory Visit: Payer: Self-pay | Admitting: Pharmacist

## 2016-03-16 MED ORDER — ADALIMUMAB 40 MG/0.8ML ~~LOC~~ AJKT: 0.8000 mL | AUTO-INJECTOR | SUBCUTANEOUS | 0 refills | Status: DC

## 2016-03-24 MED FILL — HUMIRA PEN 40 MG/0.8ML PNKT: 40 | 28 days supply | Qty: 2 | Fill #0

## 2016-03-30 ENCOUNTER — Other Ambulatory Visit: Payer: Self-pay | Admitting: Pharmacist

## 2016-03-30 DIAGNOSIS — L409 Psoriasis, unspecified: Secondary | ICD-10-CM | POA: Diagnosis not present

## 2016-03-30 DIAGNOSIS — Z23 Encounter for immunization: Secondary | ICD-10-CM | POA: Diagnosis not present

## 2016-03-30 DIAGNOSIS — Z79899 Other long term (current) drug therapy: Secondary | ICD-10-CM | POA: Diagnosis not present

## 2016-03-30 MED ORDER — ADALIMUMAB 40 MG/0.8ML ~~LOC~~ AJKT
0.8000 mL | AUTO-INJECTOR | SUBCUTANEOUS | 5 refills | Status: DC
Start: 2016-03-30 — End: 2017-02-09

## 2016-03-30 MED FILL — TRIAMCINOLONE 0.1% OINTMENT: 0.1 | 14 days supply | Qty: 80 | Fill #0

## 2016-04-03 ENCOUNTER — Other Ambulatory Visit: Payer: Self-pay | Admitting: Internal Medicine

## 2016-04-03 NOTE — Telephone Encounter (Signed)
Patient see's Dr.Kumar for DM. Please advise    KP

## 2016-04-06 ENCOUNTER — Other Ambulatory Visit: Payer: Self-pay | Admitting: Internal Medicine

## 2016-04-06 ENCOUNTER — Other Ambulatory Visit: Payer: Self-pay | Admitting: *Deleted

## 2016-04-06 MED ORDER — METFORMIN HCL 1000 MG PO TABS: 1000.0000 mg | ORAL_TABLET | Freq: Two times a day (BID) | ORAL | 1 refills | Status: DC

## 2016-04-06 MED FILL — CARVEDILOL 12.5 MG TABLET: 12.5 | 30 days supply | Qty: 60 | Fill #5

## 2016-04-06 MED FILL — TRULICITY 1.5 MG/0.5 ML PEN: 1.5 | 28 days supply | Qty: 2 | Fill #1

## 2016-04-06 MED FILL — DULoxetine HCL 60 MG CPEP: 60 | 30 days supply | Qty: 60 | Fill #5

## 2016-04-06 MED FILL — OMEPRAZOLE DR 40 MG CAPSULE: 40 | 30 days supply | Qty: 30 | Fill #5

## 2016-04-06 MED FILL — metFORMIN HCL 1000 MG TABS: 1000 | 30 days supply | Qty: 60 | Fill #0

## 2016-04-06 NOTE — Telephone Encounter (Signed)
Caller name: [place] Relation to pt: self  Call back number: [place]  Reason for call: Pt came in office stating that pharmacy had sent a requesting refill for  metFORMIN (GLUCOPHAGE) 1000 MG tablet still have not receive refill at pharmacy, pt is requesting 90 days. Pt states is going to waiting for it at the pharmacy. Please advise ASAP.

## 2016-04-06 NOTE — Telephone Encounter (Signed)
Rx faxed.    KP 

## 2016-04-07 ENCOUNTER — Ambulatory Visit (INDEPENDENT_AMBULATORY_CARE_PROVIDER_SITE_OTHER): Payer: 59 | Admitting: Neurology

## 2016-04-07 DIAGNOSIS — E0842 Diabetes mellitus due to underlying condition with diabetic polyneuropathy: Secondary | ICD-10-CM

## 2016-04-07 DIAGNOSIS — E1140 Type 2 diabetes mellitus with diabetic neuropathy, unspecified: Secondary | ICD-10-CM

## 2016-04-07 NOTE — Procedures (Signed)
[facility]  [address]  [place], [place] [address] Tel: [phone] Fax:  [phone] Test Date:  04/07/2016  Patient: James [name] DOB: 01/21/1956 Physician: Narda Amber, DO  Sex: Male Height: 5' 11"  Ref Phys: Narda Amber, DO  ID#: [id number] Temp: 34.4C Technician: Jerilynn Mages. Dean   Patient Complaints: This is a 60 year old gentleman with history of diabetes and psoriatic arthritis on Humira referred for evaluation of bilateral feet paresthesias.  He also has a long history of pes planus since childhood.  NCV & EMG Findings: Extensive electrodiagnostic testing of the right lower extremity and additional studies of the left shows: 1. Bilateral sural and superficial peroneal sensory responses are absent. 2. Bilateral peroneal motor responses recording at the extensor digitorum brevis showed reduced amplitude and mild conduction velocity slowing on the left; however, peroneal motor responses recording at the tibialis anterior is within normal limits. Bilateral tibial motor responses are absent. 3. Bilateral tibial H reflexes show prolonged latency.  Right tibial F-wave reflex is absent.  4. Sparse chronic motor axon loss changes are seen affecting bilateral flexor digitorum longus muscles, without accompanied active denervation.  Impression: 1. The electrophysiologic findings are most consistent with a chronic, distal and symmetric sensorimotor polyneuropathy, predominantly axon loss in type, affecting the lower extremities. 2. There is no evidence of a superimposed lumbosacral radiculopathy.   ___________________________ Narda Amber, DO    Nerve Conduction Studies Anti Sensory Summary Table   Site NR Peak (ms) Norm Peak (ms) P-T Amp (V) Norm P-T Amp  Left Sup Peroneal Anti Sensory (Ant Lat Mall)  34.4C  12 cm NR  <4.6  >4  Right Sup Peroneal Anti Sensory (Ant Lat Mall)  34.4C  12 cm NR  <4.6  >4  Left Sural Anti Sensory (Lat Mall)  34.4C  Calf NR   <4.6  >4  Right Sural Anti Sensory (Lat Mall)  34.4C  Calf NR  <4.6  >4   Motor Summary Table   Site NR Onset (ms) Norm Onset (ms) O-P Amp (mV) Norm O-P Amp Site1 Site2 Delta-0 (ms) Dist (cm) Vel (m/s) Norm Vel (m/s)  Left Peroneal Motor (Ext Dig Brev)  34.4C  Ankle    4.3 <6.0 1.5 >2.5 B Fib Ankle 11.0 40.0 36 >40  B Fib    15.3  1.2  Poplt B Fib 2.7 10.0 37 >40  Poplt    18.0  1.1         Right Peroneal Motor (Ext Dig Brev)  34.4C  Ankle    4.7 <6.0 0.3 >2.5 B Fib Ankle 9.7 39.0 40 >40  B Fib    14.4  0.2  Poplt B Fib 2.5 10.0 40 >40  Poplt    16.9  0.2         Left Peroneal TA Motor (Tib Ant)  34.4C  Fib Head    3.2 <4.5 3.4 >3 Poplit Fib Head 2.5 10.0 40 >40  Poplit    5.7  3.3         Right Peroneal TA Motor (Tib Ant)  34.4C  Fib Head    3.0 <4.5 3.5 >3 Poplit Fib Head 2.5 10.0 40 >40  Poplit    5.5  3.5         Left Tibial Motor (Abd Hall Brev)  34.4C  Ankle NR  <6.0  >4 Knee Ankle  0.0  >40  Knee NR            Right Tibial Motor (  Abd Hall Brev)  34.4C  Ankle NR  <6.0  >4 Knee Ankle  0.0  >40  Knee NR             F Wave Studies   NR F-Lat (ms) Lat Norm (ms) L-R F-Lat (ms)  Right Tibial (Mrkrs) (Abd Hallucis)  34.4C  NR  <55    H Reflex Studies   NR H-Lat (ms) Lat Norm (ms) L-R H-Lat (ms) M-Lat (ms) HLat-MLat (ms)  Left Tibial (Gastroc)  34.4C     42.18 <35 2.31 5.85 36.33  Right Tibial (Gastroc)  34.4C     44.49 <35 2.31 5.85 38.64   EMG   Side Muscle Ins Act Fibs Psw Fasc Number Recrt Dur Dur. Amp Amp. Poly Poly. Comment  Left AntTibialis Nml Nml Nml Nml Nml Nml Nml Nml Nml Nml Nml Nml N/A  Left Gastroc Nml Nml Nml Nml Nml Nml Nml Nml Nml Nml Nml Nml N/A  Left Flex Dig Long Nml Nml Nml Nml 1- Rapid Some 1+ Some 1+ Nml Nml N/A  Left RectFemoris Nml Nml Nml Nml Nml Nml Nml Nml Nml Nml Nml Nml N/A  Left GluteusMed Nml Nml Nml Nml Nml Nml Nml Nml Nml Nml Nml Nml N/A  Left BicepsFemS Nml Nml Nml Nml Nml Nml Nml Nml Nml Nml Nml Nml N/A  Right AntTibialis  Nml Nml Nml Nml Nml Nml Nml Nml Nml Nml Nml Nml N/A  Right Gastroc Nml Nml Nml Nml Nml Nml Nml Nml Nml Nml Nml Nml N/A  Right Flex Dig Long Nml Nml Nml Nml 1- Rapid Some 1+ Some 1+ Nml Nml N/A  Right RectFemoris Nml Nml Nml Nml Nml Nml Nml Nml Nml Nml Nml Nml N/A  Right GluteusMed Nml Nml Nml Nml Nml Nml Nml Nml Nml Nml Nml Nml N/A  Right BicepsFemS Nml Nml Nml Nml Nml Nml Nml Nml Nml Nml Nml Nml N/A      Waveforms:

## 2016-04-14 ENCOUNTER — Ambulatory Visit: Payer: 59 | Admitting: Internal Medicine

## 2016-04-16 ENCOUNTER — Encounter: Payer: Self-pay | Admitting: *Deleted

## 2016-04-16 ENCOUNTER — Other Ambulatory Visit: Payer: Self-pay | Admitting: *Deleted

## 2016-04-16 NOTE — Patient Outreach (Signed)
[facility]) Care Management   04/16/2016  James [name] [birth year] [id number]  James [name] is an 60 y.o. male who presents to the [place] Management office for routine Link To Wellness follow up for self management assistance with Type II DM, HTN, hyperlipidemia  and obesity.  Subjective: James [name] says he is doing OK. No complaints except ongoing bilateral foot pain and numbness. He says he is seeing a neurologist, Dr. Posey Pronto,  for the neuropathy and last saw her on 9/11 and she reduced his Neurontin dosage. He says he also had a nerve conduction test done on 04/07/16. He says the neuropathy has not improved despite an improvement in his Hgb A1C.  He says he continues to go for long periods of time without eating and will check his blood sugar if he does not feel well, he denies hypoglycemia since his last visit. 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 He says he saw Dr. [name] on 8/15 and he was told his Metformin will be switched to extended release at the next refill since he takes all of his medicines at the same time.  He says his psoriasis is "acting up" again despite his adherence with Humira.  Objective:   Review of Systems  Constitutional: Negative.     Physical Exam  Constitutional: He is oriented to person, place, and time. He appears well-developed and well-nourished.  Respiratory: Effort normal.  Neurological: He is alert and oriented to person, place, and time.  Skin: Skin is warm and dry.     Psychiatric: He has a normal mood and affect. His behavior is normal. Judgment and thought content normal.  Scaly white plaque lesions on both lower legs.   Vitals:   04/16/16 1018  BP: 120/88   Filed Weights   04/16/16 1018  Weight: 254 lb 3.2 oz (115.3 kg)   Encounter Medications:   Outpatient Encounter Prescriptions as of 04/16/2016    Medication Sig Note  . Adalimumab (HUMIRA PEN) 40 MG/0.8ML PNKT Inject 0.8 mLs into the skin every 14 (fourteen) days.   . carvedilol (COREG) 12.5 MG tablet Take 1 tablet (12.5 mg total) by mouth 2 (two) times daily with a meal.   . DULoxetine (CYMBALTA) 60 MG capsule Take 2 capsules (120 mg total) by mouth daily.   . fenofibrate 160 MG tablet Take 1 tablet (160 mg total) by mouth daily. Patient has not had this filled since 08/15/15  . gabapentin (NEURONTIN) 600 MG tablet Take 1 tablet (600 mg total) by mouth 3 (three) times daily. (Patient taking differently: Take 1,800 mg by mouth at bedtime. ) 04/16/2016: Takes two 600 mg tablets at night  . metFORMIN (GLUCOPHAGE) 1000 MG tablet TAKE 1 TABLET BY MOUTH TWICE DAILY WITH A MEAL 04/16/2016: At next refill, he will go to extended release  . omeprazole (PRILOSEC) 40 MG capsule Take 1 capsule (40 mg total) by mouth daily.   . TRULICITY 1.5 JG/8.1LX SOPN INJECT THE CONTENTS OF ONE PEN ONCE PER WEEK   . BAYER MICROLET LANCETS lancets Use as instructed to check blood sugar 3 times per day dx code E11.9 (Patient not taking: Reported on 03/09/2016)   . metFORMIN (GLUCOPHAGE) 1000 MG tablet Take 1 tablet (1,000 mg total) by mouth 2 (two) times daily with a meal. (Patient not taking: Reported on 04/16/2016) 72/62/0355: duplication   No facility-administered encounter  medications on file as of 04/16/2016.     Functional Status:   In your present state of health, do you have any difficulty performing the following activities: 01/02/2016 10/24/2015  Hearing? N N  Vision? N N  Difficulty concentrating or making decisions? N N  Walking or climbing stairs? N N  Dressing or bathing? N N  Doing errands, shopping? N N  Some recent data might be hidden    Fall/Depression Screening:    PHQ 2/9 Scores 01/02/2016 10/24/2015 05/07/2015 04/08/2015 03/14/2015 01/28/2015 09/13/2014  PHQ - 2 Score 0 0 0 0 1 2 0  PHQ- 9 Score - - - - - 11 -    Assessment:  Spouse of [employer] employee with Type II DM, HTN, hyperlipidemia and obesity. Meeting treatment targets for HTN and Hgb A1C.  Most recent Hgb A1C= 6.1% on 02/04/16 previously 7.9% on 10/24/15, and much improved from 10.1% on 08/07/15. Abnormal lipid profile on 02/04/16 and taking no medications  Plan:   [facility] CM Care Plan Problem One        Most Recent Value   Care Plan Problem One  Link To Wellness member with Type II DM with ongoing improved  glycemic control as evidenced by Hgb A1C= 6.1% on 02/04/16, previously 7.9% on  10/24/15, and 10.1% on 08/07/15,  also with morbid obesity (BMI= 37.5) and HTN and meeting treatment targets as evidenced by consistent BP readings <140/<90, and hyperlipidemia with abnormal lipid profile on 02/04/16 and taking no medications   Role Documenting the Problem One  Care Management [place] for Problem One  Active   [facility] Long Term Goal (31-90 days)  Sustained good  glycemic control as evidenced by Hgb A1C<6.5% at next assessment, improved lipid profile at next check, ongoing good control of HTN as evidenced by consistent BP readings <140/<90 and no evidence of weight gain or weight loss at next assessment   [facility] Long Term Goal Start Date  04/16/16   [facility] Long Term Goal Met Date     Interventions for Problem One Long Term Goal  discussed details of  visit with neurologist on 9/11 and results of nerve conduction tests on 10/10,  reviewed medications and medication adherence, reviewed mechanism of action of Trulicity and Metformin, reviewed common side effects of both, will send in basket message to Dr. [name] and Dr. [name] that James [name] is not taking fenofibrate and that his last fill date was 08/15/15, reviewed office visit notes of 8/15 with Dr. [name] and lab results of Hgb A1C and lipid profile and CMET drawn 02/04/16, congratulated [name] on ongoing improvement in  glycemic control,  ensured health maintenance checks are up to date, reviewed upcoming  appointment for labs on 11/10 and appointment with  Dr. [name] on 11/15, since James [name] is not eligible to enroll in [facility] because he does not have a smartphone arranged for Link To Wellness follow up in January 2018     RNCM to fax today's office visit note to Dr. [name] and Dr. [name]. RNCM will meet quarterly and as needed with patient per Link To Wellness program guidelines to assist with Type II DM, HTN, hyperlipidemia and obesity self-management and assess patient's progress toward mutually set goals. Barrington Ellison RN,CCM,CDE [place] Management Coordinator Link To Wellness Office Phone [phone] Office Fax [phone]

## 2016-04-17 ENCOUNTER — Other Ambulatory Visit: Payer: Self-pay | Admitting: Endocrinology

## 2016-04-17 MED ORDER — FENOFIBRATE 160 MG PO TABS: 160.0000 mg | ORAL_TABLET | Freq: Every day | ORAL | 5 refills | Status: DC

## 2016-04-17 MED FILL — FENOFIBRATE 160 MG TABLET: 160 | 30 days supply | Qty: 30 | Fill #0

## 2016-04-21 ENCOUNTER — Other Ambulatory Visit: Payer: Self-pay | Admitting: *Deleted

## 2016-04-21 NOTE — Patient Outreach (Signed)
James [name] returned call and this RNCM advised him his prescription for fenofibrate is ready to be picked up at the [facility] outpatient pharmacy at [place]. Explained the medicine is to treat his elevated triglycerides. James [name] voiced understanding and compliance and said he will likely pick up the medicine today. Barrington Ellison RN,CCM,CDE [place] Management Coordinator Link To Wellness Office Phone [phone] Office Fax [phone]

## 2016-04-21 NOTE — Patient Outreach (Signed)
Left message on [name]'s cell phone requesting he return call to this RNCM to discuss resumption of fenofibrate therapy per Dr. [name] advice.  Await return call. Barrington Ellison RN,CCM,CDE [place] Management Coordinator Link To Wellness Office Phone [phone] Office Fax [phone]

## 2016-04-30 MED FILL — HUMIRA PEN 40 MG/0.8ML PNKT: 40 | 28 days supply | Qty: 2 | Fill #0

## 2016-05-06 MED FILL — metFORMIN HCL 1000 MG TABS: 1000 | 30 days supply | Qty: 60 | Fill #1

## 2016-05-08 ENCOUNTER — Other Ambulatory Visit (INDEPENDENT_AMBULATORY_CARE_PROVIDER_SITE_OTHER): Payer: 59

## 2016-05-08 DIAGNOSIS — E1165 Type 2 diabetes mellitus with hyperglycemia: Secondary | ICD-10-CM

## 2016-05-08 LAB — BASIC METABOLIC PANEL
BUN: 23 mg/dL (ref 6–23)
CO2: 24 meq/L (ref 19–32)
Calcium: 9.4 mg/dL (ref 8.4–10.5)
Chloride: 105 mEq/L (ref 96–112)
Creatinine, Ser: 1.57 mg/dL — ABNORMAL HIGH (ref 0.40–1.50)
GFR: 48.44 mL/min — AB (ref >60.00)
Glucose, Bld: 164 mg/dL — ABNORMAL HIGH (ref 70–99)
POTASSIUM: 4.5 meq/L (ref 3.5–5.1)
SODIUM: 138 meq/L (ref 135–145)

## 2016-05-08 LAB — HEMOGLOBIN A1C: Hgb A1c MFr Bld: 5.9 % (ref 4.6–6.5)

## 2016-05-13 ENCOUNTER — Ambulatory Visit (INDEPENDENT_AMBULATORY_CARE_PROVIDER_SITE_OTHER): Payer: 59 | Admitting: Endocrinology

## 2016-05-13 ENCOUNTER — Encounter: Payer: Self-pay | Admitting: Endocrinology

## 2016-05-13 VITALS — BP 138/80 | HR 78 | Ht 69.0 in | Wt 257.0 lb

## 2016-05-13 DIAGNOSIS — E1165 Type 2 diabetes mellitus with hyperglycemia: Secondary | ICD-10-CM

## 2016-05-13 NOTE — Patient Instructions (Addendum)
Check blood sugars on waking up  2x per week  Also check blood sugars about 2 hours after a meal and do this after different meals by rotation  Recommended blood sugar levels on waking up is 90-130 and about 2 hours after meal is 130-160  Please bring your blood sugar monitor to each visit, thank you  Start exercise daily  Metformin 1/2 in am and 1 at dinner  Fenofibrate 3x per week

## 2016-05-13 NOTE — Progress Notes (Signed)
Patient ID: James [name], male   DOB: 05/05/1956, 60 y.o.   MRN: [id number]           Reason for Appointment: f/u for Type 2 Diabetes  Referring physician: [name]  History of Present Illness:          Date of diagnosis of type 2 diabetes mellitus:  ?  2014      Background history:  James [name] is not clear how his diabetes was diagnosed, likely on routine testing. Initially had been treated with metformin and also tried on Amaryl Patient had progressive increase in his blood sugars since 1/17 with stopping metformin and being on the regimen of Amaryl and Januvia, James [name] thinks his blood sugars went up to 601.  James [name] was then started on basal bolus insulin   Recent history:   Non-insulin hypoglycemic drugs the patient is taking are: Trulicity 1.5 mg weekly,Metformin 2000 mg at dinner     His A1c is again normal at 5.9, previously 6.1  However his actual blood sugars are higher than expected  Current management, blood sugar patterns and problems identified:   Recently despite increasing his Trulicity James [name] has not lost any weight  James [name] has checked his blood sugars only very sporadically  James [name] has only about 4 readings midday and James [name] thinks these are usually before eating and is range between 122-201 with average of 150  Glucose before suppertime 106 and at 8 PM = 171  James [name] has been doing better with taking 2000 mg of metformin daily  Side effects from medications have been: None  Compliance with the medical regimen: Fair  Glucose monitoring:  done <1 times a day         Glucometer:  Contour Blood Glucose readings by download as above  Overall average 146  Self-care: The diet that the patient has been following is: Decreased sugar intake .     Meal times are:  Breakfast is periodically skipped.  Lunch: 2-3 PM Dinner: 5-6 PM   Typical meal intake: Breakfast is frequently nothing otherwise bacon and eggs.          Dietician visit, most recent:09/2015               Exercise:  minimal, just some  yardwork  Weight history:  Wt Readings from Last 3 Encounters:  05/13/16 257 lb (116.6 kg)  04/16/16 254 lb 3.2 oz (115.3 kg)  03/09/16 256 lb (116.1 kg)    Glycemic control:   Lab Results  Component Value Date   HGBA1C 5.9 05/08/2016   HGBA1C 6.1 02/04/2016   HGBA1C 7.9 (H) 10/24/2015   Lab Results  Component Value Date   MICROALBUR <0.7 02/04/2016   CREATININE 1.57 (H) 05/08/2016    Lab on 05/08/2016  Component Date Value Ref Range Status  . Hgb A1c MFr Bld 05/08/2016 5.9  4.6 - 6.5 % Final  . Sodium 05/08/2016 138  135 - 145 mEq/L Final  . Potassium 05/08/2016 4.5  3.5 - 5.1 mEq/L Final  . Chloride 05/08/2016 105  96 - 112 mEq/L Final  . CO2 05/08/2016 24  19 - 32 mEq/L Final  . Glucose, Bld 05/08/2016 164* 70 - 99 mg/dL Final  . BUN 05/08/2016 23  6 - 23 mg/dL Final  . Creatinine, Ser 05/08/2016 1.57* 0.40 - 1.50 mg/dL Final  . Calcium 05/08/2016 9.4  8.4 - 10.5 mg/dL Final  . GFR 05/08/2016 48.44* >60.00 mL/min Final         Medication List  Accurate as of 05/13/16  2:40 PM. Always use your most recent med list.          Adalimumab 40 MG/0.8ML Pnkt Commonly known as:  HUMIRA PEN Inject 0.8 mLs into the skin every 14 (fourteen) days.   BAYER MICROLET LANCETS lancets Use as instructed to check blood sugar 3 times per day dx code E11.9   carvedilol 12.5 MG tablet Commonly known as:  COREG Take 1 tablet (12.5 mg total) by mouth 2 (two) times daily with a meal.   DULoxetine 60 MG capsule Commonly known as:  CYMBALTA Take 2 capsules (120 mg total) by mouth daily.   fenofibrate 160 MG tablet Take 1 tablet (160 mg total) by mouth daily.   gabapentin 600 MG tablet Commonly known as:  NEURONTIN Take 1 tablet (600 mg total) by mouth 3 (three) times daily.   metFORMIN 1000 MG tablet Commonly known as:  GLUCOPHAGE Take 1 tablet (1,000 mg total) by mouth 2 (two) times daily with a meal.   metFORMIN 1000 MG tablet Commonly known as:   GLUCOPHAGE TAKE 1 TABLET BY MOUTH TWICE DAILY WITH A MEAL   omeprazole 40 MG capsule Commonly known as:  PRILOSEC Take 1 capsule (40 mg total) by mouth daily.   TRULICITY 1.5 UE/4.5WU Sopn Generic drug:  Dulaglutide INJECT THE CONTENTS OF ONE PEN ONCE PER WEEK       Allergies:  Allergies  Allergen Reactions  . Hydrocodone-Homatropine Other (See Comments)    Depressed feeling  . Morphine And Related Other (See Comments)    Hallucinations, back in the 80s. States has taken vicodin before w/o problems   . Sulfa Drugs Cross Reactors Rash    Past Medical History:  Diagnosis Date  . Acid reflux   . Chronic headaches    on cymbalta  . Cirrhosis ([place])   . Colon polyps   . Depression    on cymbalta  . Diabetes mellitus with neuropathy ([place])    borderline per primary MD  . Diverticulitis 03/2013  . Diverticulosis   . Eczema   . Elevated LFTs   . Fatty liver   . Hypertension   . Insomnia 04/26/2013  . Kidney stone   . Morbid obesity ([place])   . Post-traumatic hydrocephalus    s/p shunts x 2 (first got infected )  . Psoriasis    sees Dr [name]  . Psoriatic arthritis ([place])   . 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 Stomach ulcer   . Testosterone deficiency 04/28/2011    Past Surgical History:  Procedure Laterality Date  . BACK SURGERY  1980  . SHOULDER SURGERY Left 2010  . TOTAL HIP ARTHROPLASTY Left 2011  . VENTRICULOPERITONEAL SHUNT  2007   x2    Family History  Problem Relation Age of Onset  . Healthy Mother   . Lung cancer Father     alive, former smoker   . Heart disease Brother     MI age 60  . Other Brother     Murdered  . Down syndrome Son   . Diabetes Neg Hx   . Prostate cancer Neg Hx   . Colon cancer Neg Hx     Social History:  reports that James [name] has never smoked. James [name] has never used smokeless tobacco. James [name] reports that James [name] does not drink alcohol or use drugs.    Review of Systems    Lipid history: Currently  on fenofibrate for treatment, appears to have  had high triglycerides LDL 103 on the last lipid levels, still not well controlled    Lab Results  Component Value Date   CHOL 235 (H) 02/04/2016   HDL 40.90 02/04/2016   LDLDIRECT 103.0 02/04/2016   TRIG 325.0 (H) 02/04/2016   CHOLHDL 6 02/04/2016            Most recent eye exam was  2017  Most recent foot exam: 07/2015 James [name] has symptomatic neuropathy and is taking gabapentin twice a day and also Cymbalta  Review of Systems  RENAL dysfunction: His creatinine is higher than usual.  James [name] takes Aleve only once a week or so and no other nonsteroidal drugs Not on antihypertensives   Physical Examination:  BP 138/80   Pulse 78   Ht 5' 9"  (1.753 m)   Wt 257 lb (116.6 kg)   SpO2 94%   BMI 37.95 kg/m       ASSESSMENT:  Diabetes type 2, uncontrolled with BMI 38 See history of present illness for detailed discussion of current diabetes management, blood sugar patterns and problems identified  His blood sugars are much better as judged by his A1c of 5.9 although his blood sugars are higher than expected and averaging about 140+ Still has difficulty losing weight despite using Trulicity 1.5 mg Currently not motivated to exercise  HYPERLIPIDEMIA: Triglycerides are still high, needs weight loss, will recheck on the next visit  CREATININE is higher than usual, previously normal, etiology unclear  PLAN:    More postprandial readings and check blood sugar at least twice a week  Start exercising at the gym, James [name] is going to go with his wife.  Reduce metformin to 1500 mg for now until renal function improved  Take fenofibrate every other day  Follow-up with PCP for high creatinine  James [name] refuses the flu vaccine  Patient Instructions  Check blood sugars on waking up  2x per week  Also check blood sugars about 2 hours after a meal and do this after different meals by rotation  Recommended blood sugar levels on waking up is 90-130 and  about 2 hours after meal is 130-160  Please bring your blood sugar monitor to each visit, thank you  Start exercise daily  Metformin 1/2 in am and 1 at dinner  Fenofibrate 3x per week      James [name] 05/13/2016, 2:40 PM   Note: This office note was prepared with Estate agent. Any transcriptional errors that result from this process are unintentional.

## 2016-05-15 MED FILL — FENOFIBRATE 160 MG TABLET: 160 | 30 days supply | Qty: 30 | Fill #1

## 2016-05-15 MED FILL — TRULICITY 1.5 MG/0.5 ML PEN: 1.5 | 28 days supply | Qty: 2 | Fill #2

## 2016-05-15 MED FILL — CARVEDILOL 12.5 MG TABLET: 12.5 | 30 days supply | Qty: 60 | Fill #6

## 2016-05-15 MED FILL — DULoxetine HCL 60 MG CPEP: 60 | 30 days supply | Qty: 60 | Fill #6

## 2016-06-04 MED FILL — HUMIRA PEN 40 MG/0.8ML PNKT: 40 | 28 days supply | Qty: 2 | Fill #1

## 2016-06-04 MED FILL — metFORMIN HCL 1000 MG TABS: 1000 | 90 days supply | Qty: 180 | Fill #0

## 2016-06-12 ENCOUNTER — Other Ambulatory Visit: Payer: Self-pay | Admitting: Internal Medicine

## 2016-06-12 MED FILL — TRULICITY 1.5 MG/0.5 ML PEN: 1.5 | 28 days supply | Qty: 2 | Fill #3

## 2016-06-12 MED FILL — CARVEDILOL 12.5 MG TABLET: 12.5 | 30 days supply | Qty: 60 | Fill #0

## 2016-06-12 MED FILL — DULoxetine HCL 60 MG CPEP: 60 | 30 days supply | Qty: 60 | Fill #0

## 2016-06-12 MED FILL — FENOFIBRATE 160 MG TABLET: 160 | 30 days supply | Qty: 30 | Fill #2

## 2016-06-12 MED FILL — OMEPRAZOLE DR 40 MG CAPSULE: 40 | 30 days supply | Qty: 30 | Fill #6

## 2016-06-29 HISTORY — PX: TOE SURGERY: SHX1073

## 2016-07-13 MED FILL — FENOFIBRATE 160 MG TABLET: 160 | 30 days supply | Qty: 30 | Fill #3

## 2016-07-13 MED FILL — CARVEDILOL 12.5 MG TABLET: 12.5 | 30 days supply | Qty: 60 | Fill #1

## 2016-07-13 MED FILL — HUMIRA PEN 40 MG/0.8ML PNKT: 40 | 28 days supply | Qty: 2 | Fill #2

## 2016-07-13 MED FILL — DULoxetine HCL 60 MG CPEP: 60 | 30 days supply | Qty: 60 | Fill #1

## 2016-07-16 ENCOUNTER — Ambulatory Visit: Payer: Self-pay | Admitting: *Deleted

## 2016-07-24 ENCOUNTER — Encounter: Payer: Self-pay | Admitting: Pharmacist

## 2016-07-24 ENCOUNTER — Telehealth (HOSPITAL_BASED_OUTPATIENT_CLINIC_OR_DEPARTMENT_OTHER): Payer: 59 | Admitting: Pharmacist

## 2016-07-24 DIAGNOSIS — Z79899 Other long term (current) drug therapy: Secondary | ICD-10-CM

## 2016-07-24 NOTE — Progress Notes (Signed)
S: Patient has a follow up today for his specialty medication, Humira.   Patient is currently taking Humira for psoriatic arthritis. Patient is managed by Crista Luria for this.   Adherence: denies any missed doses  Dosing: SubQ: 40 mg every other week  Drug-drug interactions:none  Screening: TB test: completed prior to drug initiation Hepatitis: completed prior to drug initiation   Monitoring: S/sx of infection: none CBC: last CBC normal, needs a more recent one. S/sx of hypersensitivity: none S/sx of malignancy: none S/sx of heart failure: no diagnosis of HF    O:     Lab Results  Component Value Date   WBC 6.3 08/14/2015   HGB 13.5 08/14/2015   HCT 39.0 08/14/2015   MCV 86.3 08/14/2015   PLT 143 (L) 08/14/2015      Chemistry      Component Value Date/Time   NA 138 05/08/2016 1052   NA 136 (A) 04/04/2015   K 4.5 05/08/2016 1052   CL 105 05/08/2016 1052   CO2 24 05/08/2016 1052   BUN 23 05/08/2016 1052   BUN 28 (A) 04/04/2015   CREATININE 1.57 (H) 05/08/2016 1052   GLU 143 04/04/2015      Component Value Date/Time   CALCIUM 9.4 05/08/2016 1052   ALKPHOS 76 02/04/2016 1015   AST 25 02/04/2016 1015   ALT 29 02/04/2016 1015   BILITOT 0.7 02/04/2016 1015       A/P: 1. Medication review: Patient on Humira for psoriatic arthritis and is tolerating it well with no adverse effects and reported control of psoriasis. His last metabolic panel was relatively normal but he had elevated SCr - there are no drug dose reductions in reduced renal function with Humira. No recent CBC, recommended that he get an updated CBC either through dermatology or his primary care provider. Patient verbalized understanding. I have faxed Dr. Tonia Brooms, the dermatologist managing his psoriasis, for her most recent office note and any labs. No recommendations for any changes at this time.    Christella Hartigan, PharmD, BCPS, BCACP, [place] [facility] [phone]

## 2016-08-04 ENCOUNTER — Ambulatory Visit (INDEPENDENT_AMBULATORY_CARE_PROVIDER_SITE_OTHER): Payer: 59 | Admitting: Podiatry

## 2016-08-04 ENCOUNTER — Ambulatory Visit (HOSPITAL_BASED_OUTPATIENT_CLINIC_OR_DEPARTMENT_OTHER)
Admission: RE | Admit: 2016-08-04 | Discharge: 2016-08-04 | Disposition: A | Discharge status: Home / Self Care | Payer: 59 | Source: Ambulatory Visit | Attending: Podiatry | Admitting: Podiatry

## 2016-08-04 ENCOUNTER — Encounter: Payer: Self-pay | Admitting: Podiatry

## 2016-08-04 DIAGNOSIS — Q828 Other specified congenital malformations of skin: Secondary | ICD-10-CM

## 2016-08-04 DIAGNOSIS — M2042 Other hammer toe(s) (acquired), left foot: Secondary | ICD-10-CM | POA: Diagnosis not present

## 2016-08-04 DIAGNOSIS — E1149 Type 2 diabetes mellitus with other diabetic neurological complication: Secondary | ICD-10-CM | POA: Diagnosis not present

## 2016-08-04 DIAGNOSIS — M79675 Pain in left toe(s): Secondary | ICD-10-CM | POA: Diagnosis not present

## 2016-08-04 DIAGNOSIS — L84 Corns and callosities: Secondary | ICD-10-CM | POA: Diagnosis not present

## 2016-08-04 DIAGNOSIS — M19072 Primary osteoarthritis, left ankle and foot: Secondary | ICD-10-CM | POA: Diagnosis not present

## 2016-08-04 NOTE — Patient Instructions (Signed)

## 2016-08-04 NOTE — Progress Notes (Signed)
   Subjective:    Patient ID: James [name], male    DOB: 1955/12/31, 61 y.o.   MRN: [id number]  HPI  James [name] presents to the office today for concerns of a very painful corn on the left 5th toe which has been ongoing for about 20 years. He states he will trim the callus and pad the area and it starts to feel better but then the corn comes right back and he has pain. He has also tried changing his shoes without any relief. At this time he would like to discuss surgery to help decrease his pain and deformity to the toe. He has no other complaints today.    Review of Systems  All other systems reviewed and are negative.      Objective:   Physical Exam General: AAO x3, NAD  Dermatological: Hyperkeratotic lesion to the dorsal lateal aspect of the left 5th PIPJ. After debridement there is no underlying ulceration, drainage, or signs of infection. The toenail on the right 3rd toe did come off yesterday and the wound bed is granular. There is no surrounding erythema or increase in warmth. No other open lesions or identified today.   Vascular: Dorsalis Pedis artery and Posterior Tibial artery pedal pulses are 2/4 bilateral with immedate capillary fill time. Pedal hair growth present. No varicosities and no lower extremity edema present bilateral. There is no pain with calf compression, swelling, warmth, erythema.   Neruologic: Sensation decreasd with James [name] monofilament.  Musculoskeletal:hammertoes are present lesser digits most notably the left fifth toe is contracted and in an adductovarus position causing pressure to the left 5th toe.   Gait: Unassisted, Nonantalgic.     Assessment & Plan:  61 year old male with hammertoe resulting in an painful hyperkeratotic lesion. -Treatment options discussed including all alternatives, risks, and complications -Etiology of symptoms were discussed -X-rays were obtained and reviewed with the patient.  -Hyperkeratotic lesion was sharply  without complications or bleeding x 1 -I discussed both conservative and surgical treatment options with the patient. At this time he wishes to proceed with surgical intervention as he has tried numerous conservative treatments -Discussed Left 5th digit PIPJ arthroplasty. He wishes to proceed understanding potential risks and complications. -The incision placement as well as the postoperative course was discussed with the patient. I discussed risks of the surgery which include, but not limited to, infection, bleeding, pain, swelling, need for further surgery, delayed or nonhealing, painful or ugly scar, numbness or sensation changes, over/under correction, recurrence, transfer lesions, further deformity, hardware failure, DVT/PE, loss of toe/foot. Patient understands these risks and wishes to proceed with surgery. The surgical consent was reviewed with the patient all 3 pages were signed. No promises or guarantees were given to the outcome of the procedure. All questions were answered to the best of my ability. Before the surgery the patient was encouraged to call the office if there is any further questions. The surgery will be performed at the [facility] on an outpatient basis. -Surgical shoe dispensed for postop.   Celesta Gentile, DPM

## 2016-08-11 ENCOUNTER — Other Ambulatory Visit: Payer: Self-pay | Admitting: Endocrinology

## 2016-08-11 ENCOUNTER — Other Ambulatory Visit: Payer: Self-pay | Admitting: Internal Medicine

## 2016-08-11 MED FILL — GABAPENTIN 600 MG TABLET: 600 | 30 days supply | Qty: 90 | Fill #0

## 2016-08-11 MED FILL — HUMIRA PEN 40 MG/0.8ML PNKT: 40 | 28 days supply | Qty: 2 | Fill #3

## 2016-08-11 MED FILL — FENOFIBRATE 160 MG TABLET: 160 | 30 days supply | Qty: 30 | Fill #4

## 2016-08-11 MED FILL — OMEPRAZOLE DR 40 MG CAPSULE: 40 | 30 days supply | Qty: 30 | Fill #0

## 2016-08-11 MED FILL — TRULICITY 1.5 MG/0.5 ML PEN: 1.5 | 28 days supply | Qty: 2 | Fill #0

## 2016-08-11 MED FILL — CARVEDILOL 12.5 MG TABLET: 12.5 | 30 days supply | Qty: 60 | Fill #0

## 2016-08-11 MED FILL — DULoxetine HCL 60 MG CPEP: 60 | 30 days supply | Qty: 60 | Fill #0

## 2016-08-12 ENCOUNTER — Encounter: Payer: Self-pay | Admitting: Podiatry

## 2016-08-12 DIAGNOSIS — M2042 Other hammer toe(s) (acquired), left foot: Secondary | ICD-10-CM | POA: Diagnosis not present

## 2016-08-12 DIAGNOSIS — I10 Essential (primary) hypertension: Secondary | ICD-10-CM | POA: Diagnosis not present

## 2016-08-12 MED FILL — PROMETHAZINE 25 MG TABLET: 25 | 6 days supply | Qty: 20 | Fill #0

## 2016-08-12 MED FILL — HYDROCODON-APAP 5-325: 5-325 | 3 days supply | Qty: 30 | Fill #0

## 2016-08-12 MED FILL — CEPHALEXIN 500 MG CAPSULE: 500 | 7 days supply | Qty: 21 | Fill #0

## 2016-08-13 ENCOUNTER — Ambulatory Visit: Payer: Self-pay | Admitting: *Deleted

## 2016-08-18 ENCOUNTER — Encounter: Payer: Self-pay | Admitting: Podiatry

## 2016-08-18 ENCOUNTER — Ambulatory Visit (INDEPENDENT_AMBULATORY_CARE_PROVIDER_SITE_OTHER): Payer: Self-pay | Admitting: Podiatry

## 2016-08-18 ENCOUNTER — Ambulatory Visit (HOSPITAL_BASED_OUTPATIENT_CLINIC_OR_DEPARTMENT_OTHER): Admission: RE | Admit: 2016-08-18 | Payer: 59 | Source: Ambulatory Visit

## 2016-08-18 DIAGNOSIS — M2042 Other hammer toe(s) (acquired), left foot: Secondary | ICD-10-CM

## 2016-08-18 NOTE — Progress Notes (Signed)
Subjective: CORDARRYL MONRREAL is a 61 y.o. is seen today in office s/p left 5th digit hammertoe repair due to painful callus on the toe preformed on 08/12/16. He states that he is not having any pain. He has remained in the surgical shoe.  Denies any systemic complaints such as fevers, chills, nausea, vomiting. No calf pain, chest /pain, shortness of breath.   Objective: General: No acute distress, AAOx3  DP/PT pulses palpable 2/4, CRT < 3 sec to all digits.  Protective sensation intact. Motor function intact.  Right foot: Incision is well coapted without any evidence of dehiscence and sutures are intact. There is no surrounding erythema, ascending cellulitis, fluctuance, crepitus, malodor, drainage/purulence. There is minimal edema around the surgical site. There is no pain along the surgical site. Toe sits in a much more rectus position.  No other areas of tenderness to bilateral lower extremities.  No other open lesions or pre-ulcerative lesions.  No pain with calf compression, swelling, warmth, erythema.   Assessment and Plan:  Status post right hammertoe repair, doing well with no complications   -Treatment options discussed including all alternatives, risks, and complications -X-ray ordered today. Directed him to stop at the 1st floor radiology suite on the way out to get an x-ray. He understood -Antibiotic ointment and a bandage was applied. Keep the bandage clean, dry, intact. -Ice/elevation -Pain medication as needed. He has not been taking any medication -Monitor for any clinical signs or symptoms of infection and DVT/PE and directed to call the office immediately should any occur or go to the ER. -Follow-up in 1 week for suture removal or sooner if any problems arise. In the meantime, encouraged to call the office with any questions, concerns, change in symptoms.   *After the suture removal he can start to transition to a regular shoe as tolerated. I will see him back 2-3 weeks after  suture removal.   Celesta Gentile, DPM

## 2016-08-25 ENCOUNTER — Ambulatory Visit (INDEPENDENT_AMBULATORY_CARE_PROVIDER_SITE_OTHER): Payer: Self-pay

## 2016-08-25 DIAGNOSIS — M2042 Other hammer toe(s) (acquired), left foot: Secondary | ICD-10-CM

## 2016-08-25 NOTE — Progress Notes (Signed)
Patient presents today for POV #2 s/p 5th left hammertoe repair on 08/12/16. Sutures were removed and incision remained intact. Patient states toe feels good, no signs of redness, swelling or drainage. Informed patient for the next 48 hours to keep area covered with a band aid and to lightly wash and dry the area thoroughly. Reviewed signs of infections and asked patient to call if there were concerns. Follow up with Dr. Jacqualyn Posey in 2 weeks.

## 2016-08-26 NOTE — Progress Notes (Signed)
DOS 02.14.2018 Left fifth toe hammertoe repair.

## 2016-08-31 MED FILL — metFORMIN HCL 1000 MG TABS: 1000 | 90 days supply | Qty: 180 | Fill #1

## 2016-09-03 ENCOUNTER — Ambulatory Visit (INDEPENDENT_AMBULATORY_CARE_PROVIDER_SITE_OTHER): Payer: 59 | Admitting: Internal Medicine

## 2016-09-03 ENCOUNTER — Encounter: Payer: Self-pay | Admitting: Internal Medicine

## 2016-09-03 VITALS — BP 130/80 | HR 76 | Ht 69.0 in | Wt 261.6 lb

## 2016-09-03 DIAGNOSIS — R1032 Left lower quadrant pain: Secondary | ICD-10-CM | POA: Diagnosis not present

## 2016-09-03 DIAGNOSIS — K5732 Diverticulitis of large intestine without perforation or abscess without bleeding: Secondary | ICD-10-CM | POA: Diagnosis not present

## 2016-09-03 DIAGNOSIS — K7581 Nonalcoholic steatohepatitis (NASH): Secondary | ICD-10-CM | POA: Diagnosis not present

## 2016-09-03 DIAGNOSIS — K7460 Unspecified cirrhosis of liver: Secondary | ICD-10-CM

## 2016-09-03 MED ORDER — AMOXICILLIN-POT CLAVULANATE 875-125 MG PO TABS: 1.0000 | ORAL_TABLET | Freq: Two times a day (BID) | ORAL | 0 refills | Status: DC

## 2016-09-03 MED FILL — AMOX-CLAV 875-125 MG TABLET: 875-125 | 10 days supply | Qty: 20 | Fill #0

## 2016-09-03 NOTE — Progress Notes (Signed)
HISTORY OF PRESENT ILLNESS:  James [name] is a 61 y.o. male with multiple medical problems including psoriatic arthritis for which she is on Humira, obesity with metabolic syndrome, and [place] cirrhosis. His liver disease is compensated. Screening upper endoscopy May 2017 did not reveal varices. He is on carvedilol for cardiovascular purposes. He presents today with a chief complaint of 1-2 weeks of intermittent, and at times significant, left lower quadrant abdominal pain. Discomfort is worse with direct palpation or certain movements. He does have a history of kidney stones. This is different. No flank pain, hematuria, or dysuria, does have a history of diverticulitis. This seems similar. He denies fevers. He continues to work on weight reduction. No other complaints. Previous colonoscopy for routine screening was performed February 2015. He was found to have moderate diverticulosis throughout the colon and a non-adenomatous colon polyp.  REVIEW OF SYSTEMS:  All non-GI ROS negative upon comprehensive review  Past Medical History:  Diagnosis Date  . Acid reflux   . Chronic headaches    on cymbalta  . Cirrhosis ([place])   . Colon polyps   . Depression    on cymbalta  . Diabetes mellitus with neuropathy ([place])    borderline per primary MD  . Diverticulitis 03/2013  . Diverticulosis   . Eczema   . Elevated LFTs   . Fatty liver   . Hypertension   . Insomnia 04/26/2013  . Kidney stone   . Morbid obesity ([place])   . Post-traumatic hydrocephalus    s/p shunts x 2 (first got infected )  . Psoriasis    sees Dr [name]  . Psoriatic arthritis ([place])   . 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 Stomach ulcer   . Testosterone deficiency 04/28/2011    Past Surgical History:  Procedure Laterality Date  . BACK SURGERY  1980  . SHOULDER SURGERY Left 2010  . TOE SURGERY Left 2018  . TOTAL HIP ARTHROPLASTY Left 2011  . VENTRICULOPERITONEAL SHUNT  2007   x2     Social History JEANLUC WEGMAN  reports that he has never smoked. He has never used smokeless tobacco. He reports that he uses drugs, including Psilocybin. He reports that he does not drink alcohol.  family history includes Down syndrome in his son; Healthy in his mother; Heart disease in his brother; Lung cancer in his father; Other in his brother.  Allergies  Allergen Reactions  . Hydrocodone-Homatropine Other (See Comments)    Depressed feeling  . Morphine And Related Other (See Comments)    Hallucinations, back in the 80s. States has taken vicodin before w/o problems   . Sulfa Drugs Cross Reactors Rash       PHYSICAL EXAMINATION: Vital signs: BP 130/80   Pulse 76   Ht 5' 9"  (1.753 m)   Wt 261 lb 9.6 oz (118.7 kg)   BMI 38.63 kg/m   Constitutional: generally well-appearing, no acute distress Psychiatric: alert and oriented x3, cooperative Eyes: extraocular movements intact, anicteric, conjunctiva pink Mouth: oral pharynx moist, no lesions Neck: supple no lymphadenopathy Cardiovascular: heart regular rate and rhythm, no murmur Lungs: clear to auscultation bilaterally Abdomen: soft, obese, moderate tenderness to palpation in the left mid abdomen, nondistended, no obvious ascites, no peritoneal signs, normal bowel sounds, no organomegaly Rectal: Noted Extremities: no clubbing cyanosis or lower extremity edema bilaterally Skin: no lesions on visible extremities Neuro: No focal deficits. No asterixis.   ASSESSMENT:  #1. Acute diverticulitis to explain problems  with left lower quadrant pain #2. National cirrhosis. Compensated   PLAN:  #1. Low residue diet #2. Prescribed Augmentin 875 mg twice daily for 10 days #3. Told to contact the office after completing antibiotics if he is doing better for documentation purposes. However also told to contact the office at that time or in the interim should his symptoms persist or worsen. If so, he may need CT imaging for further  evaluation #4. Repeat screening EGD around May 2020 #5. Repeat screening colonoscopy around February 2025

## 2016-09-03 NOTE — Patient Instructions (Signed)
We have sent the following medications to your pharmacy for you to pick up at your convenience:  Augmentin  Follow a bland diet

## 2016-09-07 ENCOUNTER — Other Ambulatory Visit (INDEPENDENT_AMBULATORY_CARE_PROVIDER_SITE_OTHER): Payer: 59

## 2016-09-07 DIAGNOSIS — E1165 Type 2 diabetes mellitus with hyperglycemia: Secondary | ICD-10-CM

## 2016-09-07 LAB — COMPREHENSIVE METABOLIC PANEL
ALT: 28 U/L (ref 0–53)
AST: 28 U/L (ref 0–37)
Albumin: 4.2 g/dL (ref 3.5–5.2)
Alkaline Phosphatase: 47 U/L (ref 39–117)
BILIRUBIN TOTAL: 0.9 mg/dL (ref 0.2–1.2)
BUN: 25 mg/dL — ABNORMAL HIGH (ref 6–23)
CALCIUM: 9.9 mg/dL (ref 8.4–10.5)
CO2: 25 meq/L (ref 19–32)
CREATININE: 1.75 mg/dL — AB (ref 0.40–1.50)
Chloride: 105 mEq/L (ref 96–112)
GFR: 42.69 mL/min — AB (ref >60.00)
GLUCOSE: 137 mg/dL — AB (ref 70–99)
Potassium: 4.5 mEq/L (ref 3.5–5.1)
Sodium: 137 mEq/L (ref 135–145)
Total Protein: 8.2 g/dL (ref 6.0–8.3)

## 2016-09-07 LAB — LIPID PANEL
CHOL/HDL RATIO: 5
Cholesterol: 209 mg/dL — ABNORMAL HIGH (ref 0–200)
HDL: 43.2 mg/dL (ref >39.00)
LDL Cholesterol: 137 mg/dL — ABNORMAL HIGH (ref 0–99)
NONHDL: 165.68
TRIGLYCERIDES: 142 mg/dL (ref 0.0–149.0)
VLDL: 28.4 mg/dL (ref 0.0–40.0)

## 2016-09-07 LAB — HEMOGLOBIN A1C: Hgb A1c MFr Bld: 6.9 % — ABNORMAL HIGH (ref 4.6–6.5)

## 2016-09-08 ENCOUNTER — Ambulatory Visit (INDEPENDENT_AMBULATORY_CARE_PROVIDER_SITE_OTHER): Payer: Self-pay | Admitting: Podiatry

## 2016-09-08 ENCOUNTER — Encounter: Payer: Self-pay | Admitting: Podiatry

## 2016-09-08 DIAGNOSIS — M2042 Other hammer toe(s) (acquired), left foot: Secondary | ICD-10-CM

## 2016-09-08 DIAGNOSIS — L84 Corns and callosities: Secondary | ICD-10-CM

## 2016-09-08 MED FILL — DULoxetine HCL 60 MG CPEP: 60 | 30 days supply | Qty: 60 | Fill #1

## 2016-09-08 MED FILL — HUMIRA PEN 40 MG/0.8ML PNKT: 40 | 28 days supply | Qty: 2 | Fill #4

## 2016-09-08 MED FILL — FENOFIBRATE 160 MG TABLET: 160 | 30 days supply | Qty: 30 | Fill #5

## 2016-09-08 MED FILL — CARVEDILOL 12.5 MG TABLET: 12.5 | 30 days supply | Qty: 60 | Fill #1

## 2016-09-08 MED FILL — TRULICITY 1.5 MG/0.5 ML PEN: 1.5 | 28 days supply | Qty: 2 | Fill #1

## 2016-09-08 NOTE — Progress Notes (Signed)
Subjective: James [name] is a 61 y.o. is seen today in office s/p left 5th digit hammertoe repair due to painful callus on the toe preformed on 08/12/16. He states that he is not having any pain. He has returned to a regular shoe and he is able to wear without any issues. He states that his pain has resolved since the surgery. Denies any systemic complaints such as fevers, chills, nausea, vomiting. No calf pain, chest /pain, shortness of breath.   Objective: General: No acute distress, AAOx3  DP/PT pulses palpable 2/4, CRT < 3 sec to all digits.  Protective sensation intact. Motor function intact.  Right foot: Incision is well coapted without any evidence of dehiscence and scar has formed.  There is no surrounding erythema, ascending cellulitis, fluctuance, crepitus, malodor, drainage/purulence. There is minimal edema around the surgical site. There is no pain along the surgical site. Toe sits in a more rectus position compared to prior to surgery. There are hammer toes of the other lesser digits.  No other areas of tenderness to bilateral lower extremities.  No other open lesions or pre-ulcerative lesions.  No pain with calf compression, swelling, warmth, erythema.   Assessment and Plan:  Status post right hammertoe repair, doing well with no complications   -Treatment options discussed including all alternatives, risks, and complications -At this time he is doing well she's having no pain. Continue supportive shoe gear. I will discharge him from his postoperative course. He has any issues to call the office for follow-up. He agrees to this plan.  Celesta Gentile, DPM

## 2016-09-10 ENCOUNTER — Ambulatory Visit (INDEPENDENT_AMBULATORY_CARE_PROVIDER_SITE_OTHER): Payer: 59 | Admitting: Endocrinology

## 2016-09-10 ENCOUNTER — Encounter: Payer: Self-pay | Admitting: Endocrinology

## 2016-09-10 ENCOUNTER — Other Ambulatory Visit: Payer: Self-pay

## 2016-09-10 ENCOUNTER — Telehealth: Payer: Self-pay | Admitting: Internal Medicine

## 2016-09-10 VITALS — BP 130/86 | HR 72 | Ht 69.0 in | Wt 257.0 lb

## 2016-09-10 DIAGNOSIS — E1165 Type 2 diabetes mellitus with hyperglycemia: Secondary | ICD-10-CM | POA: Diagnosis not present

## 2016-09-10 DIAGNOSIS — R1030 Lower abdominal pain, unspecified: Secondary | ICD-10-CM

## 2016-09-10 MED ORDER — ATORVASTATIN CALCIUM 10 MG PO TABS: 10.0000 mg | ORAL_TABLET | Freq: Every day | ORAL | 3 refills | Status: DC

## 2016-09-10 MED FILL — ATORVASTATIN 10 MG TABLET: 10 | 90 days supply | Qty: 90 | Fill #0

## 2016-09-10 NOTE — Telephone Encounter (Signed)
He needs a CT abdomen and pelvis with oral and IV contrast.  However, he is on metformin, and radiology will require him to be off the metformin a certain period of time before receiving IV contrast.  Please find out from radiology how long they need him to be off.  If it is 48 hours, then we will most likely have to do the CT with oral contrast only.  Please let me know ASAP today.

## 2016-09-10 NOTE — Telephone Encounter (Signed)
Holding the metformin is day of exam and 48 hours after. Patient has also had recent BUN/Creatinine done on 3/12.

## 2016-09-10 NOTE — Patient Instructions (Addendum)
Metformin 1/2 in am and 1 at pm  Brisk walk daily  Stop Fenofibrate and start Lipitor 1 daily  More sugars at nite   Check blood sugars on waking up  2x weekly  Also check blood sugars about 2 hours after a meal and do this after different meals by rotation  Recommended blood sugar levels on waking up is 90-130 and about 2 hours after meal is 130-160  Please bring your blood sugar monitor to each visit, thank you

## 2016-09-10 NOTE — Progress Notes (Signed)
Patient ID: James [name], male   DOB: 11/05/1955, 61 y.o.   MRN: [id number]           Reason for Appointment: f/u for Type 2 Diabetes  Referring physician: [name]  History of Present Illness:          Date of diagnosis of type 2 diabetes mellitus:  ?  2014      Background history:  He is not clear how his diabetes was diagnosed, likely on routine testing. Initially had been treated with metformin and also tried on Amaryl Patient had progressive increase in his blood sugars since 1/17 with stopping metformin and being on the regimen of Amaryl and Januvia, he thinks his blood sugars went up to 601.  He was then started on basal bolus insulin   Recent history:   Non-insulin hypoglycemic drugs the patient is taking are: Trulicity 1.5 mg weekly,Metformin 2000 mg at dinner     His A1c is relatively higher at 6.9, previously has been around 6%  Current management, blood sugar patterns and problems identified:  He has checked his blood sugars only very sporadically and has only 4 readings in the mornings for the last month including today, recent range 129-158 with average 139  He has not been doing any formal exercise as directed  Also not very consistent with diet, sometimes may have higher fat meals  He was told to reduce his metformin because of abnormal renal function but he has not done so  He is doing his Trulicity every week and does not think he has any side effects  Side effects from medications have been: None  Compliance with the medical regimen: Fair  Glucose monitoring:  done <1 times a day         Glucometer:  Contour Blood Glucose readings by download as above  Overall average 139  Self-care: The diet that the patient has been following is: Decreased sugar intake .     Meal times are:  Breakfast is periodically skipped.  Lunch: 2-3 PM Dinner: 5-6 PM   Typical meal intake: Breakfast is frequently nothing otherwise bacon and eggs.          Dietician visit, most  recent:09/2015               Exercise:  minimal, just some yardwork  Weight history:  Wt Readings from Last 3 Encounters:  09/10/16 257 lb (116.6 kg)  09/03/16 261 lb 9.6 oz (118.7 kg)  05/13/16 257 lb (116.6 kg)    Glycemic control:   Lab Results  Component Value Date   HGBA1C 6.9 (H) 09/07/2016   HGBA1C 5.9 05/08/2016   HGBA1C 6.1 02/04/2016   Lab Results  Component Value Date   MICROALBUR <0.7 02/04/2016   LDLCALC 137 (H) 09/07/2016   CREATININE 1.75 (H) 09/07/2016    Lab on 09/07/2016  Component Date Value Ref Range Status  . Hgb A1c MFr Bld 09/07/2016 6.9* 4.6 - 6.5 % Final  . Sodium 09/07/2016 137  135 - 145 mEq/L Final  . Potassium 09/07/2016 4.5  3.5 - 5.1 mEq/L Final  . Chloride 09/07/2016 105  96 - 112 mEq/L Final  . CO2 09/07/2016 25  19 - 32 mEq/L Final  . Glucose, Bld 09/07/2016 137* 70 - 99 mg/dL Final  . BUN 09/07/2016 25* 6 - 23 mg/dL Final  . Creatinine, Ser 09/07/2016 1.75* 0.40 - 1.50 mg/dL Final  . Total Bilirubin 09/07/2016 0.9  0.2 - 1.2 mg/dL Final  .  Alkaline Phosphatase 09/07/2016 47  39 - 117 U/L Final  . AST 09/07/2016 28  0 - 37 U/L Final  . ALT 09/07/2016 28  0 - 53 U/L Final  . Total Protein 09/07/2016 8.2  6.0 - 8.3 g/dL Final  . Albumin 09/07/2016 4.2  3.5 - 5.2 g/dL Final  . Calcium 09/07/2016 9.9  8.4 - 10.5 mg/dL Final  . GFR 09/07/2016 42.69* >60.00 mL/min Final  . Cholesterol 09/07/2016 209* 0 - 200 mg/dL Final  . Triglycerides 09/07/2016 142.0  0.0 - 149.0 mg/dL Final  . HDL 09/07/2016 43.20  >39.00 mg/dL Final  . VLDL 09/07/2016 28.4  0.0 - 40.0 mg/dL Final  . LDL Cholesterol 09/07/2016 137* 0 - 99 mg/dL Final  . Total CHOL/HDL Ratio 09/07/2016 5   Final  . NonHDL 09/07/2016 165.68   Final       Allergies as of 09/10/2016      Reactions   Hydrocodone-homatropine Other (See Comments)   Depressed feeling   Morphine And Related Other (See Comments)   Hallucinations, back in the 80s. States has taken vicodin before w/o  problems    Sulfa Drugs Cross Reactors Rash      Medication List       Accurate as of 09/10/16 12:48 PM. Always use your most recent med list.          Adalimumab 40 MG/0.8ML Pnkt Commonly known as:  HUMIRA PEN Inject 0.8 mLs into the skin every 14 (fourteen) days.   amoxicillin-clavulanate 875-125 MG tablet Commonly known as:  AUGMENTIN Take 1 tablet by mouth 2 (two) times daily.   atorvastatin 10 MG tablet Commonly known as:  LIPITOR Take 1 tablet (10 mg total) by mouth daily.   BAYER MICROLET LANCETS lancets Use as instructed to check blood sugar 3 times per day dx code E11.9   carvedilol 12.5 MG tablet Commonly known as:  COREG Take 1 tablet (12.5 mg total) by mouth 2 (two) times daily with a meal.   cephALEXin 500 MG capsule Commonly known as:  KEFLEX Take 500 mg by mouth 3 (three) times daily.   DULoxetine 60 MG capsule Commonly known as:  CYMBALTA Take 2 capsules (120 mg total) by mouth daily.   gabapentin 600 MG tablet Commonly known as:  NEURONTIN Take 1 tablet (600 mg total) by mouth 3 (three) times daily.   HYDROcodone-acetaminophen 5-325 MG tablet Commonly known as:  NORCO/VICODIN Take 1-2 tablets by mouth every 4 (four) hours as needed for moderate pain.   metFORMIN 1000 MG tablet Commonly known as:  GLUCOPHAGE TAKE 1 TABLET BY MOUTH TWICE DAILY WITH A MEAL   omeprazole 40 MG capsule Commonly known as:  PRILOSEC Take 1 capsule (40 mg total) by mouth daily.   promethazine 25 MG tablet Commonly known as:  PHENERGAN Take 25 mg by mouth every 8 (eight) hours as needed for nausea or vomiting.   TRULICITY 1.5 RA/3.0NM Sopn Generic drug:  Dulaglutide INJECT THE CONTENTS OF ONE PEN ONCE PER WEEK       Allergies:  Allergies  Allergen Reactions  . Hydrocodone-Homatropine Other (See Comments)    Depressed feeling  . Morphine And Related Other (See Comments)    Hallucinations, back in the 80s. States has taken vicodin before w/o problems   .  Sulfa Drugs Cross Reactors Rash    Past Medical History:  Diagnosis Date  . Acid reflux   . Chronic headaches    on cymbalta  . Cirrhosis ([place])   .  Colon polyps   . Depression    on cymbalta  . Diabetes mellitus with neuropathy ([place])    borderline per primary MD  . Diverticulitis 03/2013  . Diverticulosis   . Eczema   . Elevated LFTs   . Fatty liver   . Hypertension   . Insomnia 04/26/2013  . Kidney stone   . Morbid obesity ([place])   . Post-traumatic hydrocephalus    s/p shunts x 2 (first got infected )  . Psoriasis    sees Dr [name]  . Psoriatic arthritis ([place])   . 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 Stomach ulcer   . Testosterone deficiency 04/28/2011    Past Surgical History:  Procedure Laterality Date  . BACK SURGERY  1980  . SHOULDER SURGERY Left 2010  . TOE SURGERY Left 2018  . TOTAL HIP ARTHROPLASTY Left 2011  . VENTRICULOPERITONEAL SHUNT  2007   x2    Family History  Problem Relation Age of Onset  . Healthy Mother   . Lung cancer Father     alive, former smoker   . Heart disease Brother     MI age 30  . Other Brother     Murdered  . Down syndrome Son   . Diabetes Neg Hx   . Prostate cancer Neg Hx   . Colon cancer Neg Hx     Social History:  reports that he has never smoked. He has never used smokeless tobacco. He reports that he uses drugs, including Psilocybin. He reports that he does not drink alcohol.    Review of Systems    Lipid history: Currently on fenofibrate for treatment, appears to have had high triglycerides LDL 103 on the last lipid levels, still not well controlled    Lab Results  Component Value Date   CHOL 209 (H) 09/07/2016   HDL 43.20 09/07/2016   LDLCALC 137 (H) 09/07/2016   LDLDIRECT 103.0 02/04/2016   TRIG 142.0 09/07/2016   CHOLHDL 5 09/07/2016            Most recent eye exam was  2017  Most recent foot exam: 07/2015  He has symptomatic neuropathy and is taking  gabapentin twice a day and also Cymbalta  Review of Systems  RENAL dysfunction: His creatinine is higher than usual.  He takes Aleve only once a week or so and no other nonsteroidal drugs Not on antihypertensives   Physical Examination:  BP 130/86   Pulse 72   Ht 5' 9"  (1.753 m)   Wt 257 lb (116.6 kg)   SpO2 95%   BMI 37.95 kg/m       ASSESSMENT:  Diabetes type 2, uncontrolled with BMI 38 See history of present illness for detailed discussion of current diabetes management, blood sugar patterns and problems identified  His blood sugars are much better as judged by his A1c of 5.9 although his blood sugars are higher than expected and averaging about 140+ Still has difficulty losing weight despite using Trulicity 1.5 mg Currently not motivated to exercise  HYPERLIPIDEMIA: Triglycerides are still high, needs weight loss, will recheck on the next visit  CREATININE is higher than usual, previously normal, etiology unclear  PLAN:    More postprandial readings and check blood sugar at least twice a week  Start exercising at the gym, he is going to go with his wife.  Reduce metformin to 1500 mg for now until renal function improved  Take fenofibrate every other day  Follow-up with PCP for high creatinine  He refuses the flu vaccine  Patient Instructions  Metformin 1/2 in am and 1 at pm  Brisk walk daily  Stop Fenofibrate and start Lipitor 1 daily  More sugars at nite   Check blood sugars on waking up  2x weekly  Also check blood sugars about 2 hours after a meal and do this after different meals by rotation  Recommended blood sugar levels on waking up is 90-130 and about 2 hours after meal is 130-160  Please bring your blood sugar monitor to each visit, thank you    [facility] 09/10/2016, 12:48 PM   Note: This office note was prepared with Dragon voice recognition system technology. Any transcriptional errors that result from this process are  unintentional. Patient ID: James [name], male   DOB: 08-Jan-1956, 61 y.o.   MRN: [id number]           Reason for Appointment: f/u for Type 2 Diabetes  Referring physician: [name]  History of Present Illness:          Date of diagnosis of type 2 diabetes mellitus:  ?  2014      Background history:  He is not clear how his diabetes was diagnosed, likely on routine testing. Initially had been treated with metformin and also tried on Amaryl Patient had progressive increase in his blood sugars since 1/17 with stopping metformin and being on the regimen of Amaryl and Januvia, he thinks his blood sugars went up to 601.  He was then started on basal bolus insulin   Recent history:   Non-insulin hypoglycemic drugs the patient is taking are: Trulicity 1.5 mg weekly,Metformin 2000 mg at dinner     His A1c is again normal at 5.9, previously 6.1  However his actual blood sugars are higher than expected  Current management, blood sugar patterns and problems identified:   Recently despite increasing his Trulicity he has not lost any weight  He has checked his blood sugars only very sporadically  He has only about 4 readings midday and he thinks these are usually before eating and is range between 122-201 with average of 150  Glucose before suppertime 106 and at 8 PM = 171  He has been doing better with taking 2000 mg of metformin daily  Side effects from medications have been: None  Compliance with the medical regimen: Fair  Glucose monitoring:  done <1 times a day         Glucometer:  Contour Blood Glucose readings by download as above  Overall average 146  Self-care: The diet that the patient has been following is: Decreased sugar intake .     Meal times are:  Breakfast is periodically skipped.  Lunch: 2-3 PM Dinner: 5-6 PM   Typical meal intake: Breakfast is frequently nothing otherwise bacon and eggs.          Dietician visit, most recent:09/2015               Exercise:  minimal,  just some yardwork  Weight history:  Wt Readings from Last 3 Encounters:  09/10/16 257 lb (116.6 kg)  09/03/16 261 lb 9.6 oz (118.7 kg)  05/13/16 257 lb (116.6 kg)    Glycemic control:   Lab Results  Component Value Date   HGBA1C 6.9 (H) 09/07/2016   HGBA1C 5.9 05/08/2016   HGBA1C 6.1 02/04/2016   Lab Results  Component Value Date   MICROALBUR <0.7 02/04/2016   LDLCALC 137 (H) 09/07/2016  CREATININE 1.75 (H) 09/07/2016    Lab on 09/07/2016  Component Date Value Ref Range Status  . Hgb A1c MFr Bld 09/07/2016 6.9* 4.6 - 6.5 % Final  . Sodium 09/07/2016 137  135 - 145 mEq/L Final  . Potassium 09/07/2016 4.5  3.5 - 5.1 mEq/L Final  . Chloride 09/07/2016 105  96 - 112 mEq/L Final  . CO2 09/07/2016 25  19 - 32 mEq/L Final  . Glucose, Bld 09/07/2016 137* 70 - 99 mg/dL Final  . BUN 09/07/2016 25* 6 - 23 mg/dL Final  . Creatinine, Ser 09/07/2016 1.75* 0.40 - 1.50 mg/dL Final  . Total Bilirubin 09/07/2016 0.9  0.2 - 1.2 mg/dL Final  . Alkaline Phosphatase 09/07/2016 47  39 - 117 U/L Final  . AST 09/07/2016 28  0 - 37 U/L Final  . ALT 09/07/2016 28  0 - 53 U/L Final  . Total Protein 09/07/2016 8.2  6.0 - 8.3 g/dL Final  . Albumin 09/07/2016 4.2  3.5 - 5.2 g/dL Final  . Calcium 09/07/2016 9.9  8.4 - 10.5 mg/dL Final  . GFR 09/07/2016 42.69* >60.00 mL/min Final  . Cholesterol 09/07/2016 209* 0 - 200 mg/dL Final  . Triglycerides 09/07/2016 142.0  0.0 - 149.0 mg/dL Final  . HDL 09/07/2016 43.20  >39.00 mg/dL Final  . VLDL 09/07/2016 28.4  0.0 - 40.0 mg/dL Final  . LDL Cholesterol 09/07/2016 137* 0 - 99 mg/dL Final  . Total CHOL/HDL Ratio 09/07/2016 5   Final  . NonHDL 09/07/2016 165.68   Final       Allergies as of 09/10/2016      Reactions   Hydrocodone-homatropine Other (See Comments)   Depressed feeling   Morphine And Related Other (See Comments)   Hallucinations, back in the 80s. States has taken vicodin before w/o problems    Sulfa Drugs Cross Reactors Rash        Medication List       Accurate as of 09/10/16 12:48 PM. Always use your most recent med list.          Adalimumab 40 MG/0.8ML Pnkt Commonly known as:  HUMIRA PEN Inject 0.8 mLs into the skin every 14 (fourteen) days.   amoxicillin-clavulanate 875-125 MG tablet Commonly known as:  AUGMENTIN Take 1 tablet by mouth 2 (two) times daily.   atorvastatin 10 MG tablet Commonly known as:  LIPITOR Take 1 tablet (10 mg total) by mouth daily.   BAYER MICROLET LANCETS lancets Use as instructed to check blood sugar 3 times per day dx code E11.9   carvedilol 12.5 MG tablet Commonly known as:  COREG Take 1 tablet (12.5 mg total) by mouth 2 (two) times daily with a meal.   cephALEXin 500 MG capsule Commonly known as:  KEFLEX Take 500 mg by mouth 3 (three) times daily.   DULoxetine 60 MG capsule Commonly known as:  CYMBALTA Take 2 capsules (120 mg total) by mouth daily.   gabapentin 600 MG tablet Commonly known as:  NEURONTIN Take 1 tablet (600 mg total) by mouth 3 (three) times daily.   HYDROcodone-acetaminophen 5-325 MG tablet Commonly known as:  NORCO/VICODIN Take 1-2 tablets by mouth every 4 (four) hours as needed for moderate pain.   metFORMIN 1000 MG tablet Commonly known as:  GLUCOPHAGE TAKE 1 TABLET BY MOUTH TWICE DAILY WITH A MEAL   omeprazole 40 MG capsule Commonly known as:  PRILOSEC Take 1 capsule (40 mg total) by mouth daily.   promethazine 25 MG tablet Commonly known  as:  PHENERGAN Take 25 mg by mouth every 8 (eight) hours as needed for nausea or vomiting.   TRULICITY 1.5 EP/3.2RJ Sopn Generic drug:  Dulaglutide INJECT THE CONTENTS OF ONE PEN ONCE PER WEEK       Allergies:  Allergies  Allergen Reactions  . Hydrocodone-Homatropine Other (See Comments)    Depressed feeling  . Morphine And Related Other (See Comments)    Hallucinations, back in the 80s. States has taken vicodin before w/o problems   . Sulfa Drugs Cross Reactors Rash    Past Medical  History:  Diagnosis Date  . Acid reflux   . Chronic headaches    on cymbalta  . Cirrhosis ([place])   . Colon polyps   . Depression    on cymbalta  . Diabetes mellitus with neuropathy ([place])    borderline per primary MD  . Diverticulitis 03/2013  . Diverticulosis   . Eczema   . Elevated LFTs   . Fatty liver   . Hypertension   . Insomnia 04/26/2013  . Kidney stone   . Morbid obesity ([place])   . Post-traumatic hydrocephalus    s/p shunts x 2 (first got infected )  . Psoriasis    sees Dr [name]  . Psoriatic arthritis ([place])   . 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 Stomach ulcer   . Testosterone deficiency 04/28/2011    Past Surgical History:  Procedure Laterality Date  . BACK SURGERY  1980  . SHOULDER SURGERY Left 2010  . TOE SURGERY Left 2018  . TOTAL HIP ARTHROPLASTY Left 2011  . VENTRICULOPERITONEAL SHUNT  2007   x2    Family History  Problem Relation Age of Onset  . Healthy Mother   . Lung cancer Father     alive, former smoker   . Heart disease Brother     MI age 42  . Other Brother     Murdered  . Down syndrome Son   . Diabetes Neg Hx   . Prostate cancer Neg Hx   . Colon cancer Neg Hx     Social History:  reports that he has never smoked. He has never used smokeless tobacco. He reports that he uses drugs, including Psilocybin. He reports that he does not drink alcohol.    Review of Systems    Lipid history: Currently on fenofibrate for treatment, appears to have had high triglycerides previously LDL has gone up further up to 137    Lab Results  Component Value Date   CHOL 209 (H) 09/07/2016   HDL 43.20 09/07/2016   LDLCALC 137 (H) 09/07/2016   LDLDIRECT 103.0 02/04/2016   TRIG 142.0 09/07/2016   CHOLHDL 5 09/07/2016            Most recent eye exam was  2017  Most recent foot exam: 07/2015 He has symptomatic neuropathy and is taking gabapentin twice a day and also Cymbalta, Has fair relief of the  symptoms  Review of Systems  RENAL dysfunction: His creatinine is higher than usual. And has gone up further  He has not discussed this with his PCP Not taking any significant amounts of Aleve or Advil Does not appear to have any other nephrotoxic drugs   Physical Examination:  BP 130/86   Pulse 72   Ht 5' 9"  (1.753 m)   Wt 257 lb (116.6 kg)   SpO2 95%   BMI 37.95 kg/m       ASSESSMENT:  Diabetes  type 2, uncontrolled with BMI 38 See history of present illness for detailed discussion of current diabetes management, blood sugar patterns and problems identified  His blood sugars are higher as judged by his A1c of 6.9, this is increased 1% from his last visit Most likely is not doing well with his diet even with taking Trulicity Not motivated to exercise and is only working around the house   HYPERLIPIDEMIA: Triglycerides are better but his LDL has gone up  CREATININE is higher than before and etiology is not evident He needs to be evaluated by PCP Meanwhile he can stop taking fenofibrate  PLAN:    More postprandial readings and check blood sugar at least twice a week  Start exercising with walking regularly  Reduce metformin to 1500 mg for now until renal function improved  Take Lipitor 10 mg daily  Stop fenofibrate   Follow-up with PCP for high creatinine and further evaluation   Patient Instructions  Metformin 1/2 in am and 1 at pm  Brisk walk daily  Stop Fenofibrate and start Lipitor 1 daily  More sugars at nite   Check blood sugars on waking up  2x weekly  Also check blood sugars about 2 hours after a meal and do this after different meals by rotation  Recommended blood sugar levels on waking up is 90-130 and about 2 hours after meal is 130-160  Please bring your blood sugar monitor to each visit, thank you    [facility] 09/10/2016, 12:48 PM   Note: This office note was prepared with Dragon voice recognition system technology. Any  transcriptional errors that result from this process are unintentional.

## 2016-09-10 NOTE — Telephone Encounter (Signed)
Patient advised of CT abdomen/pelvis scheduled for 3/16 at [facility], 10:30. Patient will come by our office to pick up oral contrast and prep instructions.

## 2016-09-10 NOTE — Telephone Encounter (Signed)
His recent creatinine of 1.75 means NO IV contrast.  Please arrange CT scan abdomen and pelvis with oral contrast only tomorrow.  Since he will not have IV contrast, he can continue his metformin as usual.

## 2016-09-10 NOTE — Telephone Encounter (Signed)
Patient of Dr. Henrene Pastor, who is out of the office. Routing to Dr. Loletha Carrow who is DOD:  patient called office to let us know he is still having abdominal pain on left but also now on right side. Denies fever, no blood in stool, and he is having bm's. Started on Augmentin 875 mg on 3/9. Looking at Dr. Blanch Media office note, he wanted patient to contact us if sxs worsening, possible CT. Please advise.

## 2016-09-11 ENCOUNTER — Ambulatory Visit (HOSPITAL_COMMUNITY)
Admission: RE | Admit: 2016-09-11 | Discharge: 2016-09-11 | Disposition: A | Discharge status: Home / Self Care | Payer: 59 | Source: Ambulatory Visit | Attending: Gastroenterology | Admitting: Gastroenterology

## 2016-09-11 ENCOUNTER — Telehealth: Payer: Self-pay | Admitting: Internal Medicine

## 2016-09-11 ENCOUNTER — Encounter (HOSPITAL_COMMUNITY): Payer: Self-pay

## 2016-09-11 DIAGNOSIS — R1030 Lower abdominal pain, unspecified: Secondary | ICD-10-CM

## 2016-09-11 DIAGNOSIS — K7460 Unspecified cirrhosis of liver: Secondary | ICD-10-CM | POA: Diagnosis not present

## 2016-09-11 DIAGNOSIS — K5730 Diverticulosis of large intestine without perforation or abscess without bleeding: Secondary | ICD-10-CM | POA: Insufficient documentation

## 2016-09-11 DIAGNOSIS — I700 Atherosclerosis of aorta: Secondary | ICD-10-CM | POA: Diagnosis not present

## 2016-09-11 DIAGNOSIS — K8020 Calculus of gallbladder without cholecystitis without obstruction: Secondary | ICD-10-CM | POA: Insufficient documentation

## 2016-09-11 NOTE — Telephone Encounter (Signed)
James [name] pt that called yesterday with abd pain, CT scan ordered and was done today. Pt calling for CT results. Please advise.

## 2016-09-11 NOTE — Telephone Encounter (Signed)
CT scan shows that he does not have diverticulitis.  He can stop any antibiotics that he has left. CT scan shows no visible cause for the pain he is describing.  It is not clear to me why he has pain since I have not seen him in clinic.  I will ask Dr Henrene Pastor to either call him or see him in clinic early next week.

## 2016-09-11 NOTE — Telephone Encounter (Signed)
Spoke with pt and he is aware.

## 2016-09-11 NOTE — Telephone Encounter (Signed)
Vaughan Basta, have him see APP next week as I'm in the hospital. Thanks

## 2016-09-14 NOTE — Telephone Encounter (Signed)
Spoke with pt and he is aware of appt.

## 2016-09-14 NOTE — Telephone Encounter (Signed)
Pt scheduled to see Tye Savoy NP 09/17/16@3pm . Left message for pt to call back.

## 2016-09-17 ENCOUNTER — Ambulatory Visit (INDEPENDENT_AMBULATORY_CARE_PROVIDER_SITE_OTHER): Payer: 59 | Admitting: Nurse Practitioner

## 2016-09-17 ENCOUNTER — Encounter: Payer: Self-pay | Admitting: *Deleted

## 2016-09-17 VITALS — BP 126/74 | HR 70 | Ht 69.0 in | Wt 259.0 lb

## 2016-09-17 DIAGNOSIS — R109 Unspecified abdominal pain: Secondary | ICD-10-CM

## 2016-09-17 MED ORDER — DICLOFENAC SODIUM 1 % TD GEL: 2.0000 g | Freq: Four times a day (QID) | TRANSDERMAL | 1 refills | Status: DC

## 2016-09-17 MED FILL — DICLOFENAC SODIUM 1% GEL: 1 | 50 days supply | Qty: 400 | Fill #0

## 2016-09-17 NOTE — Patient Instructions (Signed)
If you are age 59 or older, your body mass index should be between 23-30. Your Body mass index is 38.25 kg/m. If this is out of the aforementioned range listed, please consider follow up with your Primary Care Provider.  If you are age 60 or younger, your body mass index should be between 19-25. Your Body mass index is 38.25 kg/m. If this is out of the aformentioned range listed, please consider follow up with your Primary Care Provider.   We have sent the following medications to your pharmacy for you to pick up at your convenience:  Voltaren Gel  Please call Dr. Blanch Media nurse in a few days with an update.  Thank you.

## 2016-09-17 NOTE — Progress Notes (Signed)
     HPI: Patient is a 61 year old male with multiple medical problems as listed below. He saw Dr. Henrene Pastor on the eighth of this month with left lower quadrant pain. Acute diverticulitis felt likely, low residue diet recommended and  Ten days of Augmentin prescribed. Patient called the office March 15 for persistent abdominal pain.  CT scan done, did not show diverticulitis or any acute findings. There was a non-obstructing right renal stone. Antibiotics were stopped. Patient was given an appointment for today  His pain did not improve at all with antibiotics. He describes the pain as being aggravated by deep breaths and twisting but sometimes it occurs when just sitting down watching TV. Pain sometimes shoots upwards all the way into his neck. Episodes last 15-30 minutes then resolve spontaneously. No skin lesions in the area to suggest shingles. Bowel movements are fine. No urinary symptoms. No fevers. He has a history of back surgery / rods   Past Medical History:  Diagnosis Date  . Acid reflux   . Cholelithiasis   . Chronic headaches    on cymbalta  . Cirrhosis ([place])   . Colon polyps   . Depression    on cymbalta  . Diabetes mellitus with neuropathy ([place])    borderline per primary MD  . Diverticulitis 03/2013  . Diverticulosis   . Eczema   . Elevated LFTs   . Fatty liver   . Hypertension   . Insomnia 04/26/2013  . Kidney stone   . Morbid obesity ([place])   . Post-traumatic hydrocephalus    s/p shunts x 2 (first got infected )  . Psoriasis    sees Dr [name]  . Psoriatic arthritis ([place])   . 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 Stomach ulcer   . Testosterone deficiency 04/28/2011    Patient's surgical history, family medical history, social history, medications and allergies were all reviewed in Epic    Physical Exam: BP 126/74   Pulse 70   Ht 5' 9"  (1.753 m)   Wt 259 lb (117.5 kg)   BMI 38.25 kg/m   GENERAL: obese white male in  NAD PSYCH: :Pleasant, cooperative, normal affect EENT:  conjunctiva pink, mucous membranes moist, neck supple without masses CARDIAC:  RRR,  no peripheral edema PULM: Normal respiratory effort, lungs CTA bilaterally, no wheezing ABDOMEN:  soft, obese, nondistended, no obvious masses, normal bowel sounds. Localized area of superficial tenderness to very lateral left aspect of mid abdomen.  SKIN:  turgor, no lesions seen Musculoskeletal:  Normal muscle tone, normal strength NEURO: Alert and oriented x 3, no focal neurologic deficits   ASSESSMENT and PLAN:   61 yo old male with left sided abdominal pain (very lateral aspect of left mid abdomen) where there is localized superficial tenderness. Pain has strong positional component. Treated empirically for diverticulitis several days ago without any improvement in pain. Suspect musculoskeletal vr abdominal wall pain.  -Trial of Voltaren Gel QID.  -call us next week if not improving.     Tye Savoy , NP 09/17/2016, 3:14 PM

## 2016-09-18 NOTE — Progress Notes (Signed)
Agree with initial assessment and plan

## 2016-09-23 ENCOUNTER — Encounter: Payer: Self-pay | Admitting: Internal Medicine

## 2016-09-23 ENCOUNTER — Ambulatory Visit (INDEPENDENT_AMBULATORY_CARE_PROVIDER_SITE_OTHER): Payer: 59 | Admitting: Internal Medicine

## 2016-09-23 VITALS — BP 124/72 | HR 77 | Temp 97.8°F | Resp 14 | Ht 69.0 in | Wt 257.0 lb

## 2016-09-23 DIAGNOSIS — E785 Hyperlipidemia, unspecified: Secondary | ICD-10-CM

## 2016-09-23 DIAGNOSIS — Z0000 Encounter for general adult medical examination without abnormal findings: Secondary | ICD-10-CM

## 2016-09-23 MED ORDER — DULOXETINE HCL 60 MG PO CPEP: 120.0000 mg | ORAL_CAPSULE | Freq: Every day | ORAL | 5 refills | Status: DC

## 2016-09-23 MED ORDER — OMEPRAZOLE 40 MG PO CPDR: 40.0000 mg | DELAYED_RELEASE_CAPSULE | Freq: Every day | ORAL | 5 refills | Status: DC

## 2016-09-23 MED ORDER — GABAPENTIN 600 MG PO TABS: 600.0000 mg | ORAL_TABLET | Freq: Three times a day (TID) | ORAL | 1 refills | Status: DC

## 2016-09-23 MED ORDER — CARVEDILOL 12.5 MG PO TABS: 12.5000 mg | ORAL_TABLET | Freq: Two times a day (BID) | ORAL | 5 refills | Status: DC

## 2016-09-23 MED FILL — OMEPRAZOLE DR 40 MG CAPSULE: 40 | 90 days supply | Qty: 90 | Fill #0

## 2016-09-23 MED FILL — GABAPENTIN 600 MG TABLET: 600 | 30 days supply | Qty: 90 | Fill #0

## 2016-09-23 NOTE — Progress Notes (Signed)
Subjective:    Patient ID: James [name], male    DOB: 04/18/1956, 61 y.o.   MRN: [id number]  DOS:  09/23/2016 Type of visit - description : cpx Interval history: Reviewed labs and medication list.No new major concerns.   Review of Systems He had left-sided abdominal pain, saw GI, a noncontast CT of the abdomen showed no findings to explain his symptoms. Patient decided to stop Trulicity 2 weeks ago and the pain for the last 3 days is better. He thinks the medication is the culprit. At this point denies nausea, vomiting, diarrhea. No blood in the stools. Doesn't check CBGs regularly and could not tell me if by stopping trulicity CBGs are much higher.  . Other than above, a 14 point review of systems is negative     Past Medical History:  Diagnosis Date  . Acid reflux   . Cholelithiasis   . Chronic headaches    on cymbalta  . Cirrhosis ([place])   . Colon polyps   . Depression    on cymbalta  . Diabetes mellitus with neuropathy ([place])    borderline per primary MD  . Diverticulitis 03/2013  . Diverticulosis   . Eczema   . Elevated LFTs   . Fatty liver   . Hypertension   . Insomnia 04/26/2013  . Kidney stone   . Morbid obesity ([place])   . Post-traumatic hydrocephalus    s/p shunts x 2 (first got infected )  . Psoriasis    sees Dr [name]  . Psoriatic arthritis ([place])   . 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 Stomach ulcer   . Testosterone deficiency 04/28/2011    Past Surgical History:  Procedure Laterality Date  . BACK SURGERY  1980  . SHOULDER SURGERY Left 2010  . TOE SURGERY Left 2018  . TOTAL HIP ARTHROPLASTY Left 2011  . VENTRICULOPERITONEAL SHUNT  2007   x2    Social History   Social History  . Marital status: Married    Spouse name: [name]  . Number of children: 2  . Years of education: N/A   Occupational History  . disable     Social History Main Topics  . Smoking status: Never Smoker  . Smokeless tobacco:  Never Used  . Alcohol use No  . Drug use: Yes    Types: Psilocybin  . Sexual activity: Not on file   Other Topics Concern  . 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     Family History  Problem Relation Age of Onset  . Healthy Mother   . Lung cancer Father     alive, former smoker   . Heart disease Brother     MI age 68  . Other Brother     Murdered  . Down syndrome Son   . Diabetes Neg Hx   . Prostate cancer Neg Hx   . Colon cancer Neg Hx      Allergies as of 09/23/2016      Reactions   Hydrocodone-homatropine Other (See Comments)   Depressed feeling   Morphine And Related Other (See Comments)   Hallucinations, back in the 80s. States has taken vicodin before w/o problems    Sulfa Drugs Cross Reactors Rash      Medication List  Accurate as of 09/23/16 11:59 PM. Always use your most recent med list.          Adalimumab 40 MG/0.8ML Pnkt Commonly known as:  HUMIRA PEN Inject 0.8 mLs into the skin every 14 (fourteen) days.   atorvastatin 10 MG tablet Commonly known as:  LIPITOR Take 1 tablet (10 mg total) by mouth daily.   BAYER MICROLET LANCETS lancets Use as instructed to check blood sugar 3 times per day dx code E11.9   carvedilol 12.5 MG tablet Commonly known as:  COREG Take 1 tablet (12.5 mg total) by mouth 2 (two) times daily with a meal.   diclofenac sodium 1 % Gel Commonly known as:  VOLTAREN Apply 2 g topically 4 (four) times daily. Apply topically to affected area four times daily   DULoxetine 60 MG capsule Commonly known as:  CYMBALTA Take 2 capsules (120 mg total) by mouth daily.   gabapentin 600 MG tablet Commonly known as:  NEURONTIN Take 1 tablet (600 mg total) by mouth 3 (three) times daily.   metFORMIN 1000 MG tablet Commonly known as:  GLUCOPHAGE TAKE 1 TABLET  BY MOUTH TWICE DAILY WITH A MEAL   omeprazole 40 MG capsule Commonly known as:  PRILOSEC Take 1 capsule (40 mg total) by mouth daily.   promethazine 25 MG tablet Commonly known as:  PHENERGAN Take 25 mg by mouth every 8 (eight) hours as needed for nausea or vomiting.   TRULICITY 1.5 XT/0.6YI Sopn Generic drug:  Dulaglutide INJECT THE CONTENTS OF ONE PEN ONCE PER WEEK          Objective:   Physical Exam BP 124/72 (BP Location: Left Arm, Patient Position: Sitting, Cuff Size: Normal)   Pulse 77   Temp 97.8 F (36.6 C) (Oral)   Resp 14   Ht 5' 9"  (1.753 m)   Wt 257 lb (116.6 kg)   SpO2 98%   BMI 37.95 kg/m  General:   Well developed, well nourished . NAD.  HEENT:  Normocephalic . Face symmetric, atraumatic Neck: No thyromegaly Lungs:  CTA B Normal respiratory effort, no intercostal retractions, no accessory muscle use. Heart: RRR,  no murmur.  no pretibial edema bilaterally  Abdomen:  Not distended, soft, non-tender. No rebound or rigidity.  Skin: Not pale. Not jaundice Neurologic:  alert & oriented X3.  Speech normal, gait appropriate for age and unassisted Psych--  Cognition and judgment appear intact.  Cooperative with normal attention span and concentration.  Behavior appropriate. No anxious or depressed appearing.    Assessment & Plan:   Assessment  DM Neuropathy (x years, rx gaba 05-2014, w/u 11-2014  RPR neg, vit D-B12-Folic Acid wnl ); Saw Dr Posey Pronto, [place] 1. The electrophysiologic findings are most consistent with a chronic, distal and symmetric sensorimotor polyneuropathy, predominantly axon loss in type, affecting the lower extremities. 2. There is no evidence of a superimposed lumbosacral radiculopathy. CRI HTN Hyperlipidemia  OSA , dx 2012, sleep study again 02-2015 Dr Dohmeier--> severe OSA, intolerant to CPAP Depression, insomnia ----on Cymbalta Chronic headaches -----on Cymbalta MSK: on disability d/t back pain- HAs Hypogonadism  Dx  2012, normal T 11-2014 (on no RX) GI:  --GERD, diverticulitis 2014, h/o PUD --Fatty liver per [place] 05-2014 (? Of cirrhosis per CT 2014) -- NASH per GI note 08-2015  Psoriasis, psoriatic arthritis -- on HUMIRA  Posttraumatic hydrocephalus s/p 2 shunts (first got infected) H/u urolithiasis +FH CAD brother MI age 27  PLAN DM: Self DC Trulicity 2 weeks ago d/t  stomach pain, rec to notify endo, although pt plans to reintroduce trulicity in 2 weeks and see how he does. Hyperlipidemia: Check a FLP, currently on Lipitor Chronic renal insufficiency: Due to see renal, patient reluctant to go. I encouraged him to f/u w/ them at least yearly. Abdominal pain: saw GI, noncontrast CT with no explanation of his symptoms. Apparently getting better after patient DC Trulicity,  RTC: 4 months, if he decide to see renal okay to follow-up in 6-8 months.

## 2016-09-23 NOTE — Assessment & Plan Note (Addendum)
Td 2014; declined the flu shot, pneumonia shot , zostavax --benefits discussed particularly in the setting of DM and taking Humira Colonoscopy 07-2013, Dr. Henrene Pastor, had a polyp, next per GI DRE  PSA wnl 2016  Diet and exercise discussed

## 2016-09-23 NOTE — Progress Notes (Signed)
Pre visit review using our clinic review tool, if applicable. No additional management support is needed unless otherwise documented below in the visit note. 

## 2016-09-23 NOTE — Patient Instructions (Addendum)
  GO TO THE FRONT DESK  Schedule labs to be done fasting tomorrow .  Next visit at this office in 4 months however if you decide to see the kidney doctor, then I will need to see you  in 6-8 months.  Please notify Dr. [name] about trulicity   If you have any pain, remember the only medication you can take is Tylenol, do not take Motrin, naproxen or any similar medications OTC as they can damage her kidney.

## 2016-09-24 NOTE — Assessment & Plan Note (Signed)
DM: Self DC Trulicity 2 weeks ago d/t stomach pain, rec to notify endo, although pt plans to reintroduce trulicity in 2 weeks and see how he does. Hyperlipidemia: Check a FLP, currently on Lipitor Chronic renal insufficiency: Due to see renal, patient reluctant to go. I encouraged him to f/u w/ them at least yearly. Abdominal pain: saw GI, noncontrast CT with no explanation of his symptoms. Apparently getting better after patient DC Trulicity,  RTC: 4 months, if he decide to see renal okay to follow-up in 6-8 months.

## 2016-09-28 ENCOUNTER — Other Ambulatory Visit (INDEPENDENT_AMBULATORY_CARE_PROVIDER_SITE_OTHER): Payer: 59

## 2016-09-28 DIAGNOSIS — E785 Hyperlipidemia, unspecified: Secondary | ICD-10-CM | POA: Diagnosis not present

## 2016-09-28 LAB — LIPID PANEL
CHOL/HDL RATIO: 4
CHOLESTEROL: 158 mg/dL (ref 0–200)
HDL: 40.1 mg/dL (ref >39.00)
NonHDL: 118.18
TRIGLYCERIDES: 216 mg/dL — AB (ref 0.0–149.0)
VLDL: 43.2 mg/dL — ABNORMAL HIGH (ref 0.0–40.0)

## 2016-09-28 LAB — LDL CHOLESTEROL, DIRECT: Direct LDL: 72 mg/dL

## 2016-10-01 ENCOUNTER — Other Ambulatory Visit: Payer: Self-pay | Admitting: *Deleted

## 2016-10-04 NOTE — Patient Outreach (Signed)
[facility]) Care Management   10/01/2016  DRAYSON DORKO 10-26-1955 [id number]  James FARRUGGIA is an 61 y.o. male who presents to the [facility] office for routine Link To Wellness follow up for self management assistance with Type II DM, HTN, hyperlipidemia  and obesity.  Subjective: James [name] says he is doing OK. He says he had left 5th toe surgery to correct hammertoe on 08/12/16 and has done well. He reports ongoing bilateral foot pain and numbness. He continues to see a neurologist, Dr. Posey Pronto,  for the neuropathy. James [name] says he stopped taking the Trulicity several weeks ago due to left lower abdominal pain. He says he had a CT of his abdomen that did not show any reason for the pain. He says he will likely restart the Trulicity to see if the abdominal pain returns. If it does not, he says he will continue to take the Trulicity.  He says he continues to go for long periods of time without eating and will check his blood sugar if he does not feel well, he denies hypoglycemia since his last visit. 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 He says he saw Dr. [name] on 3/15  and his Hgb A1C was 6.9%.  He says his psoriasis is well controlled at present with Humira.  Objective:   Review of Systems  Constitutional: Negative.     Physical Exam  Constitutional: He is oriented to person, place, and time. He appears well-developed and well-nourished.  Respiratory: Effort normal.  Neurological: He is alert and oriented to person, place, and time.  Skin: Skin is warm and dry.  Some dry scaly skin on lower legs but no redness.   Psychiatric: He has a normal mood and affect. His behavior is normal. Judgment and thought content normal.  Scaly white plaque lesions on both lower legs.   Vitals:   10/01/16 1033  BP: 120/84   Filed Weights   10/01/16 1033   Weight: 255 lb 6.4 oz (115.8 kg)   POC CBG= 153 after eating a banana 1.5 hours ago  Encounter Medications:   Outpatient Encounter Prescriptions as of 04/16/2016  Medication Sig Note  . Adalimumab (HUMIRA PEN) 40 MG/0.8ML PNKT Inject 0.8 mLs into the skin every 14 (fourteen) days.   . carvedilol (COREG) 12.5 MG tablet Take 1 tablet (12.5 mg total) by mouth 2 (two) times daily with a meal.   . DULoxetine (CYMBALTA) 60 MG capsule Take 2 capsules (120 mg total) by mouth daily.   Marland Kitchen gabapentin (NEURONTIN) 600 MG tablet Take 1 tablet (600 mg total) by mouth 3 (three) times daily. (Patient taking differently: Take 1,800 mg by mouth at bedtime. ) 04/16/2016: Takes two 600 mg tablets at night  . metFORMIN (GLUCOPHAGE) 1000 MG tablet TAKE 1 TABLET BY MOUTH TWICE DAILY WITH A MEAL 04/16/2016: At next refill, he will go to extended release  . omeprazole (PRILOSEC) 40 MG capsule Take 1 capsule (40 mg total) by mouth daily.   . TRULICITY 1.59 YO/3.7CH SOPN INJECT THE CONTENTS OF ONE PEN ONCE PER WEEK   . BAYER MICROLET LANCETS lancets Use as instructed to check blood sugar 3 times per day dx code E11.9 (Patient not taking: Reported on 03/09/2016)   . metFORMIN (GLUCOPHAGE) 1000 MG tablet Take 1 tablet (1,000 mg total) by mouth 2 (two) times daily with a meal. (  Patient not taking: Reported on 04/16/2016) [id number]/42/6834: duplication   No facility-administered encounter medications on file as of 04/16/2016.     Functional Status:   In your present state of health, do you have any difficulty performing the following activities: 01/02/2016 10/24/2015  Hearing? N N  Vision? N N  Difficulty concentrating or making decisions? N N  Walking or climbing stairs? N N  Dressing or bathing? N N  Doing errands, shopping? N N  Some recent data might be hidden    Fall/Depression Screening:    PHQ 2/9 Scores 01/02/2016 10/24/2015 05/07/2015 04/08/2015 03/14/2015 01/28/2015 09/13/2014  PHQ - 2 Score 0 0 0 0 1 2 0  PHQ- 9 Score - -  - - - 11 -    Assessment:  Spouse of [employer] employee with Type II DM, HTN, hyperlipidemia and obesity. Meeting treatment targets for HTN and Hgb A1C.  Most recent Hgb A1C= 6.9% on 09/08/15 previously 5.9% on 05/08/16.  Lipid profile on 09/28/16 showed elevated triglycerides a 216;  taking no medications, current body mass index= 37.8  Plan:   [facility] CM Care Plan Problem One        Most Recent Value   Care Plan Problem One  Link To Wellness member with Type II DM meeting Hgb A1C of <7.0%  as evidenced by Hgb A1C= 6.9% on 09/07/16, previously 5.9%,  also with morbid obesity (BMI= 37.5) and HTN and meeting treatment targets as evidenced by consistent BP readings <140/<90, hypertriglyceridemia with abnormal lipid profile on 09/28/16 and taking no medications   Role Documenting the Problem One  Care Management Coordinator   Care Plan for Problem One  Active   [facility]N Long Term Goal (31-90 days)  Improved glycemic control as evidenced by Hgb A1C<6.5% at next assessment, improved lipid profile at next check, ongoing good control of HTN as evidenced by consistent BP readings <140/<90 and no evidence of weight gain or weight loss at next assessment   [facility]N Long Term Goal Start Date  10/01/16   [facility] Long Term Goal Met Date     Interventions for Problem One Long Term Goal  reviewed medications and medication adherence, discussed toe surgery ,  reviewed office visit notes of 3/15 with Dr. [name] and lab results of Hgb A1C done 09/07/16 and lipid profile done 09/28/16, discussed results of the CT of abdomen that showed NASH and cirrhosis, discussed chronic kidney disease as evidenced by low GFR and increased creatinine and encouraged him to make follow up appointment with Dr. Joelyn Oms , his nephrologist , assessed POC CBG and discussed results and targets, encouraged James [name] to check his blood sugars since he stopped taking the Trulicity,  ensured health maintenance checks are up to date, reviewed upcoming  appointment with Dr [name] on  02/26/17 and need to make follow up appointment with Dr [name] in June, since James [name] is not eligible to enroll in [facility] because he does not have a smart phone arranged for Link To Wellness follow on 01/07/17     RNCM to fax today's office visit note to Dr. [name] and Dr. [name]. RNCM will meet quarterly and as needed with patient per Link To Wellness program guidelines to assist with Type II DM, HTN, hyperlipidemia and obesity self-management and assess patient's progress toward mutually set goals.  Barrington Ellison RN,CCM,CDE [place] Management Coordinator Link To Wellness Office Phone [phone] Office Fax [phone]

## 2016-10-07 ENCOUNTER — Other Ambulatory Visit: Payer: Self-pay | Admitting: Internal Medicine

## 2016-10-07 MED FILL — DULoxetine HCL 60 MG CPEP: 60 | 30 days supply | Qty: 60 | Fill #0

## 2016-10-14 ENCOUNTER — Encounter: Payer: Self-pay | Admitting: Internal Medicine

## 2016-10-15 ENCOUNTER — Encounter (HOSPITAL_COMMUNITY): Payer: Self-pay | Admitting: Emergency Medicine

## 2016-10-15 ENCOUNTER — Emergency Department (HOSPITAL_COMMUNITY): Payer: 59

## 2016-10-15 ENCOUNTER — Emergency Department (HOSPITAL_COMMUNITY)
Admission: EM | Admit: 2016-10-15 | Discharge: 2016-10-15 | Disposition: A | Discharge status: Home / Self Care | Payer: 59 | Attending: Emergency Medicine | Admitting: Emergency Medicine

## 2016-10-15 DIAGNOSIS — Z96642 Presence of left artificial hip joint: Secondary | ICD-10-CM | POA: Insufficient documentation

## 2016-10-15 DIAGNOSIS — R519 Headache, unspecified: Secondary | ICD-10-CM

## 2016-10-15 DIAGNOSIS — E1140 Type 2 diabetes mellitus with diabetic neuropathy, unspecified: Secondary | ICD-10-CM | POA: Insufficient documentation

## 2016-10-15 DIAGNOSIS — Z7984 Long term (current) use of oral hypoglycemic drugs: Secondary | ICD-10-CM | POA: Insufficient documentation

## 2016-10-15 DIAGNOSIS — Z79899 Other long term (current) drug therapy: Secondary | ICD-10-CM | POA: Diagnosis not present

## 2016-10-15 DIAGNOSIS — R51 Headache: Secondary | ICD-10-CM | POA: Insufficient documentation

## 2016-10-15 DIAGNOSIS — I10 Essential (primary) hypertension: Secondary | ICD-10-CM | POA: Diagnosis not present

## 2016-10-15 LAB — CBC WITH DIFFERENTIAL/PLATELET
BASOS PCT: 0 %
Basophils Absolute: 0 10*3/uL (ref 0.0–0.1)
EOS ABS: 0.3 10*3/uL (ref 0.0–0.7)
Eosinophils Relative: 4 %
HCT: 43.4 % (ref 39.0–52.0)
HEMOGLOBIN: 15.5 g/dL (ref 13.0–17.0)
Lymphocytes Relative: 36 %
Lymphs Abs: 3.1 10*3/uL (ref 0.7–4.0)
MCH: 31.6 pg (ref 26.0–34.0)
MCHC: 35.7 g/dL (ref 30.0–36.0)
MCV: 88.6 fL (ref 78.0–100.0)
MONOS PCT: 6 %
Monocytes Absolute: 0.5 10*3/uL (ref 0.1–1.0)
NEUTROS PCT: 54 %
Neutro Abs: 4.7 10*3/uL (ref 1.7–7.7)
Platelets: 152 10*3/uL (ref 150–400)
RBC: 4.9 MIL/uL (ref 4.22–5.81)
RDW: 14.4 % (ref 11.5–15.5)
WBC: 8.7 10*3/uL (ref 4.0–10.5)

## 2016-10-15 LAB — BASIC METABOLIC PANEL
Anion gap: 6 (ref 5–15)
BUN: 20 mg/dL (ref 6–20)
CALCIUM: 9.9 mg/dL (ref 8.9–10.3)
CO2: 26 mmol/L (ref 22–32)
CREATININE: 1.24 mg/dL (ref 0.61–1.24)
Chloride: 107 mmol/L (ref 101–111)
GFR calc non Af Amer: >60 mL/min (ref >60)
Glucose, Bld: 125 mg/dL — ABNORMAL HIGH (ref 65–99)
Potassium: 4.7 mmol/L (ref 3.5–5.1)
SODIUM: 139 mmol/L (ref 135–145)

## 2016-10-15 NOTE — Discharge Instructions (Signed)
Keep your scheduled appointment with your neurosurgical office tomorrow. Take the disc of the CT scan of your head that he had with you to your office visit. If you wish a different neurosurgeon for the future, you can call [facility]. Your blood pressure should be rechecked within the next 3 weeks. Today's was mildly elevated at 148/93

## 2016-10-15 NOTE — ED Provider Notes (Signed)
[facility] DEPT Provider Note   CSN: [id number] Arrival date & time: 10/15/16  1454     History   Chief Complaint Chief Complaint  Patient presents with  . Headache    VP shunt    HPI MYSON LEVI is a 61 y.o. male.Complains of diffuse headache gradual onset 2 or 3 days ago. Associated symptoms include mild confusion and slightly unsteady gait and symptoms of room spinning. Treated himself with Tylenol without relief. He feels improved today over yesterday. Denies fever no nausea or vomiting no other associated symptoms. No visual changes. He reports that he gets headaches proximal me twice per month however not similar to this  HPI  Past Medical History:  Diagnosis Date  . Acid reflux   . Cholelithiasis   . Chronic headaches    on cymbalta  . Cirrhosis ([place])   . Colon polyps   . Depression    on cymbalta  . Diabetes mellitus with neuropathy ([place])    borderline per primary MD  . Diverticulitis 03/2013  . Diverticulosis   . Eczema   . Elevated LFTs   . Fatty liver   . Hypertension   . Insomnia 04/26/2013  . Kidney stone   . Morbid obesity ([place])   . Post-traumatic hydrocephalus    s/p shunts x 2 (first got infected )  . Psoriasis    sees Dr [name]  . Psoriatic arthritis ([place])   . 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 Stomach ulcer   . Testosterone deficiency 04/28/2011    Patient Active Problem List   Diagnosis Date Noted  . Liver cirrhosis secondary to NASH (nonalcoholic steatohepatitis) ([place]) 01/02/2016  . H/O craniotomy 05/07/2015  . OSA on CPAP 05/07/2015  . Annual physical exam 04/08/2015  . PCP NOTES >>> 04/08/2015  . Hypersomnia with sleep apnea 01/28/2015  . Severe obesity (BMI >= 40) ([place]) 01/28/2015  . Obstructive hydrocephalus 01/28/2015  . Chronic fatigue 01/28/2015  . Depression 09/04/2014  . OSA -- dx ~ 2012, cpap intolerant 09/04/2014  . Insomnia 04/26/2013  . Sigmoid diverticulitis 04/25/2013   . Diabetes with neuropathy 04/25/2013  . Cirrhosis ([place]) 04/25/2013  . Psoriatic arthritis ([place]) 04/28/2011  . Headache 04/28/2011  . GERD (gastroesophageal reflux disease) 04/28/2011  . Urinary retention 04/28/2011  . Hypertension 04/28/2011  . Testosterone deficiency 04/28/2011    Past Surgical History:  Procedure Laterality Date  . BACK SURGERY  1980  . SHOULDER SURGERY Left 2010  . TOE SURGERY Left 2018  . TOTAL HIP ARTHROPLASTY Left 2011  . VENTRICULOPERITONEAL SHUNT  2007   x2     VP shunt placed 2007   Home Medications    Prior to Admission medications   Medication Sig Start Date End Date Taking? Authorizing Provider  Adalimumab (HUMIRA PEN) 40 MG/0.8ML PNKT Inject 0.8 mLs into the skin every 14 (fourteen) days. 03/30/16   Tresa Garter, MD  atorvastatin (LIPITOR) 10 MG tablet Take 1 tablet (10 mg total) by mouth daily. 09/10/16   Elayne Snare, MD  BAYER MICROLET LANCETS lancets Use as instructed to check blood sugar 3 times per day dx code E11.9 Patient not taking: Reported on 09/23/2016 08/30/15   Elayne Snare, MD  carvedilol (COREG) 12.5 MG tablet Take 1 tablet (12.5 mg total) by mouth 2 (two) times daily with a meal. 09/23/16   Colon Branch, MD  diclofenac sodium (VOLTAREN) 1 % GEL Apply 2 g topically 4 (four) times daily. Apply  topically to affected area four times daily Patient not taking: Reported on 10/01/2016 09/17/16   Willia Craze, NP  DULoxetine (CYMBALTA) 60 MG capsule Take 2 capsules (120 mg total) by mouth daily. 09/23/16   Colon Branch, MD  gabapentin (NEURONTIN) 600 MG tablet Take 1 tablet (600 mg total) by mouth 3 (three) times daily. 09/23/16   Colon Branch, MD  metFORMIN (GLUCOPHAGE) 1000 MG tablet TAKE 1 TABLET BY MOUTH TWICE DAILY WITH A MEAL 04/09/16   Elayne Snare, MD  omeprazole (PRILOSEC) 40 MG capsule Take 1 capsule (40 mg total) by mouth daily. 09/23/16   Colon Branch, MD  promethazine (PHENERGAN) 25 MG tablet Take 25 mg by mouth every 8 (eight) hours as  needed for nausea or vomiting. 08/12/16   Trula Slade, DPM  TRULICITY 1.5 FV/4.9SW [facility] INJECT THE CONTENTS OF ONE PEN ONCE PER WEEK Patient not taking: Reported on 10/01/2016 08/11/16   Elayne Snare, MD    Family History Family History  Problem Relation Age of Onset  . Healthy Mother   . Lung cancer Father     alive, former smoker   . Heart disease Brother     MI age 2  . Other Brother     Murdered  . Down syndrome Son   . Diabetes Neg Hx   . Prostate cancer Neg Hx   . Colon cancer Neg Hx     Social History Social History  Substance Use Topics  . Smoking status: Never Smoker  . Smokeless tobacco: Never Used  . Alcohol use No     Allergies   Hydrocodone-homatropine; Morphine and related; and Sulfa drugs cross reactors   Review of Systems Review of Systems  Musculoskeletal: Positive for gait problem.  Allergic/Immunologic: Positive for immunocompromised state.       Diabetic  Neurological: Positive for headaches.     Physical Exam Updated Vital Signs BP (!) 141/98 (BP Location: Left Arm)   Pulse 79   Temp 98 F (36.7 C) (Oral)   Resp 16   SpO2 97%   Physical Exam  Constitutional: He is oriented to person, place, and time. He appears well-developed and well-nourished. No distress.  Alert [place] Coma Score 15  HENT:  Head: Normocephalic and atraumatic.  Eyes: Conjunctivae are normal. Pupils are equal, round, and reactive to light.  Fundi benign  Neck: Neck supple. No tracheal deviation present. No thyromegaly present.  Cardiovascular: Normal rate and regular rhythm.   No murmur heard. Pulmonary/Chest: Effort normal and breath sounds normal.  Abdominal: Soft. Bowel sounds are normal. He exhibits no distension. There is no tenderness.  Musculoskeletal: Normal range of motion. He exhibits no edema or tenderness.  Neurological: He is alert and oriented to person, place, and time. Coordination normal.  Gait normal Romberg normal pronator drift normal finger  to nose normal. DTR symmetric bilaterally at knee jerk ankle jerk and biceps. Toes downgoing bilaterally  Skin: Skin is warm and dry. No rash noted.  Psychiatric: He has a normal mood and affect.  Nursing note and vitals reviewed.    ED Treatments / Results  Labs (all labs ordered are listed, but only abnormal results are displayed) Labs Reviewed  BASIC METABOLIC PANEL  CBC WITH DIFFERENTIAL/PLATELET    EKG  EKG Interpretation  Date/Time:  Thursday October 15 2016 15:04:08 EDT Ventricular Rate:  80 PR Interval:    QRS Duration: 90 QT Interval:  356 QTC Calculation: 411 R Axis:   -2 Text Interpretation:  Sinus rhythm Abnormal R-wave progression, early transition Borderline T abnormalities, lateral leads Confirmed by Jeneen Rinks  MD, [place] ([id number]) on 10/15/2016 3:21:33 PM       Radiology No results found.  Procedures Procedures (including critical care time)  Medications Ordered in ED Medications - No data to display  Results for orders placed or performed during the hospital encounter of 94/70/96  Basic metabolic panel  Result Value Ref Range   Sodium 139 135 - 145 mmol/L   Potassium 4.7 3.5 - 5.1 mmol/L   Chloride 107 101 - 111 mmol/L   CO2 26 22 - 32 mmol/L   Glucose, Bld 125 (H) 65 - 99 mg/dL   BUN 20 6 - 20 mg/dL   Creatinine, Ser 1.24 0.61 - 1.24 mg/dL   Calcium 9.9 8.9 - 10.3 mg/dL   GFR calc non Af Amer >60 >60 mL/min   GFR calc Af Amer >60 >60 mL/min   Anion gap 6 5 - 15  CBC with Differential/Platelet  Result Value Ref Range   WBC 8.7 4.0 - 10.5 K/uL   RBC 4.90 4.22 - 5.81 MIL/uL   Hemoglobin 15.5 13.0 - 17.0 g/dL   HCT 43.4 39.0 - 52.0 %   MCV 88.6 78.0 - 100.0 fL   MCH 31.6 26.0 - 34.0 pg   MCHC 35.7 30.0 - 36.0 g/dL   RDW 14.4 11.5 - 15.5 %   Platelets 152 150 - 400 K/uL   Neutrophils Relative % 54 %   Neutro Abs 4.7 1.7 - 7.7 K/uL   Lymphocytes Relative 36 %   Lymphs Abs 3.1 0.7 - 4.0 K/uL   Monocytes Relative 6 %   Monocytes Absolute 0.5 0.1 -  1.0 K/uL   Eosinophils Relative 4 %   Eosinophils Absolute 0.3 0.0 - 0.7 K/uL   Basophils Relative 0 %   Basophils Absolute 0.0 0.0 - 0.1 K/uL   Ct Head Wo Contrast  Result Date: 10/15/2016 CLINICAL DATA:  Headache with ventriculoperitoneal shunt EXAM: CT HEAD WITHOUT CONTRAST TECHNIQUE: Contiguous axial images were obtained from the base of the skull through the vertex without intravenous contrast. COMPARISON:  Head CT 07/17/2011 FINDINGS: Brain: The shunt catheter entering from a left frontal approach is in unchanged position with the tip located intraparenchymal E within the right caudate head. Compared to the study of 07/17/2011, the right lateral ventricle has increased in size slightly, but there is no hydrocephalus. No intracranial hemorrhage or evidence of acute infarct. No midline shift or other mass effect. Vascular: No hyperdense vessel or unexpected calcification. Skull: Bilateral frontal burr holes. Sinuses/Orbits: The visualized portions of the paranasal sinuses and mastoid air cells are free of fluid. No advanced mucosal thickening. The visualized orbits are normal. Other: None IMPRESSION: 1. Unchanged position of left frontal approach VP shunt catheter with tip located intraparenchymal E within the right caudate head. No associated hemorrhage. 2. No hydrocephalus or other evidence of shunt failure. Electronically Signed   By: Ulyses Jarred M.D.   On: 10/15/2016 18:08  I consulted [facility] neurosurgical center spoke with 33 assistant who requested noncontrasted CT scan of head to check for chest malfunction Initial Impression / Assessment and Plan / ED Course  I have reviewed the triage vital signs and the nursing notes.  Pertinent labs & imaging results that were available during my care of the patient were reviewed by me and considered in my medical decision making (see chart for details).    6:35 PM patient states his headache is  mild. He is alert appropriate Glasgow Coma  Score 15. There is no evidence of shunt dysfunction. No evidence of stroke or encephalopathy Plan he will keep his scheduled plan with neurosurgical often [place] tomorrow.  Final Clinical Impressions(s) / ED Diagnoses  Diagnosis #1headache #2 elevated blood pressure Final diagnoses:  None    New Prescriptions New Prescriptions   No medications on file     Orlie Dakin, MD 10/15/16 1842

## 2016-10-15 NOTE — ED Triage Notes (Signed)
Pt c/o progressive generalized headaches and dizziness, symmetrical bilateral weakness, losing track of time, confusion. VP shunt in place. No vision changes.

## 2016-10-16 ENCOUNTER — Other Ambulatory Visit (HOSPITAL_BASED_OUTPATIENT_CLINIC_OR_DEPARTMENT_OTHER): Payer: Self-pay | Admitting: Neurosurgery

## 2016-10-16 DIAGNOSIS — I951 Orthostatic hypotension: Secondary | ICD-10-CM | POA: Diagnosis not present

## 2016-10-16 DIAGNOSIS — G932 Benign intracranial hypertension: Secondary | ICD-10-CM

## 2016-10-16 DIAGNOSIS — R42 Dizziness and giddiness: Secondary | ICD-10-CM | POA: Diagnosis not present

## 2016-10-23 ENCOUNTER — Other Ambulatory Visit (HOSPITAL_COMMUNITY): Payer: Self-pay | Admitting: Neurosurgery

## 2016-10-23 DIAGNOSIS — G932 Benign intracranial hypertension: Secondary | ICD-10-CM

## 2016-10-26 ENCOUNTER — Other Ambulatory Visit: Payer: Self-pay | Admitting: Radiology

## 2016-10-26 ENCOUNTER — Other Ambulatory Visit: Payer: Self-pay | Admitting: Endocrinology

## 2016-10-26 ENCOUNTER — Ambulatory Visit (HOSPITAL_COMMUNITY)
Admission: RE | Admit: 2016-10-26 | Discharge: 2016-10-26 | Disposition: A | Discharge status: Home / Self Care | Payer: 59 | Source: Ambulatory Visit | Attending: Neurosurgery | Admitting: Neurosurgery

## 2016-10-26 DIAGNOSIS — G932 Benign intracranial hypertension: Secondary | ICD-10-CM | POA: Insufficient documentation

## 2016-10-26 DIAGNOSIS — R51 Headache: Secondary | ICD-10-CM | POA: Insufficient documentation

## 2016-10-26 DIAGNOSIS — Z982 Presence of cerebrospinal fluid drainage device: Secondary | ICD-10-CM | POA: Insufficient documentation

## 2016-10-26 MED ORDER — LIDOCAINE HCL (PF) 1 % IJ SOLN
5.0000 mL | Freq: Once | INTRAMUSCULAR | Status: AC
  Administered 2016-10-26: 10 mL via INTRADERMAL

## 2016-10-26 MED ORDER — LIDOCAINE HCL 1 % IJ SOLN
INTRAMUSCULAR | Status: AC
  Filled 2016-10-26: qty 10

## 2016-10-26 MED FILL — TRULICITY 1.5 MG/0.5 ML PEN: 1.5 | 28 days supply | Qty: 2 | Fill #2

## 2016-10-26 MED FILL — CARVEDILOL 12.5 MG TABLET: 12.5 | 30 days supply | Qty: 60 | Fill #0

## 2016-10-26 MED FILL — HUMIRA PEN 40 MG/0.8ML PNKT: 40 | 28 days supply | Qty: 2 | Fill #5

## 2016-10-26 NOTE — Procedures (Signed)
LP performed at the L4-5 and L3-4 levels.  No fluid was able to be remove with the patient prone and decubitus positions.

## 2016-10-26 NOTE — Discharge Instructions (Signed)
Lumbar Puncture, Care After Refer to this sheet in the next few weeks. These instructions provide you with information on caring for yourself after your procedure. Your health care provider may also give you more specific instructions. Your treatment has been planned according to current medical practices, but problems sometimes occur. Call your health care provider if you have any problems or questions after your procedure. What can I expect after the procedure? After your procedure, it is typical to have the following sensations:  Mild discomfort or pain at the insertion site.  Mild headache that is relieved with pain medicines. Follow these instructions at home:   Avoid lifting anything heavier than 10 lb (4.5 kg) for at least 12 hours after the procedure.  Drink enough fluids to keep your urine clear or pale yellow. Contact a health care provider if:  You have fever or chills.  You have nausea or vomiting.  You have a headache that lasts for more than 2 days. Get help right away if:  You have any numbness or tingling in your legs.  You are unable to control your bowel or bladder.  You have bleeding or swelling in your back at the insertion site.  You are dizzy or faint. This information is not intended to replace advice given to you by your health care provider. Make sure you discuss any questions you have with your health care provider. Document Released: 06/20/2013 Document Revised: 11/21/2015 Document Reviewed: 02/21/2013 Elsevier Interactive Patient Education  2017 [employer].

## 2016-10-27 ENCOUNTER — Telehealth: Payer: Self-pay

## 2016-10-27 NOTE — Telephone Encounter (Signed)
-----   Message from Algernon Huxley, RN sent at 11/20/2015 10:52 AM EDT ----- Regarding: Twinrix#3 Pt needs last twinrix

## 2016-10-27 NOTE — Telephone Encounter (Signed)
Reminder letter mailed to pt.

## 2016-10-28 DIAGNOSIS — G4489 Other headache syndrome: Secondary | ICD-10-CM | POA: Diagnosis not present

## 2016-10-28 DIAGNOSIS — G932 Benign intracranial hypertension: Secondary | ICD-10-CM | POA: Diagnosis not present

## 2016-10-28 DIAGNOSIS — Z982 Presence of cerebrospinal fluid drainage device: Secondary | ICD-10-CM | POA: Diagnosis not present

## 2016-10-29 ENCOUNTER — Other Ambulatory Visit: Payer: Self-pay

## 2016-10-29 ENCOUNTER — Telehealth: Payer: Self-pay | Admitting: *Deleted

## 2016-10-29 MED ORDER — ONETOUCH VERIO IQ SYSTEM W/DEVICE KIT: PACK | 2 refills | Status: DC

## 2016-10-29 MED ORDER — GLUCOSE BLOOD VI STRP: ORAL_STRIP | 2 refills | Status: DC

## 2016-10-29 MED ORDER — FREESTYLE FREEDOM LITE W/DEVICE KIT: PACK | 1 refills | Status: DC

## 2016-10-29 MED ORDER — GLUCOSE BLOOD VI STRP: ORAL_STRIP | 12 refills | Status: DC

## 2016-10-29 MED ORDER — FREESTYLE LANCETS MISC: 12 refills | Status: DC

## 2016-10-29 MED ORDER — ONETOUCH DELICA LANCETS 33G MISC: 2 refills | Status: DC

## 2016-10-29 MED FILL — FREESTYLE LITE METER: 30 days supply | Qty: 1 | Fill #0

## 2016-10-29 MED FILL — FREESTYLE LANCETS: 34 days supply | Qty: 100 | Fill #0

## 2016-10-29 MED FILL — FREESTYLE LITE TEST STRIP: 34 days supply | Qty: 100 | Fill #0

## 2016-10-29 NOTE — Telephone Encounter (Signed)
[facility] called stating they sent over a request to change a medication on this patient and they received something different than requested. Please call [phone]

## 2016-10-29 NOTE — Telephone Encounter (Signed)
I have sent this to the pharmacy for the patient.

## 2016-10-29 NOTE — Telephone Encounter (Signed)
I have sent the Freestyle Lite Kit, test strips, and lancets to this pharmacy.

## 2016-11-04 MED FILL — DULoxetine HCL 60 MG CPEP: 60 | 30 days supply | Qty: 60 | Fill #1

## 2016-11-12 ENCOUNTER — Ambulatory Visit (HOSPITAL_BASED_OUTPATIENT_CLINIC_OR_DEPARTMENT_OTHER)
Admission: RE | Admit: 2016-11-12 | Discharge: 2016-11-12 | Disposition: A | Discharge status: Home / Self Care | Payer: 59 | Source: Ambulatory Visit | Attending: Neurosurgery | Admitting: Neurosurgery

## 2016-11-12 DIAGNOSIS — R51 Headache: Secondary | ICD-10-CM | POA: Diagnosis not present

## 2016-11-12 DIAGNOSIS — S0990XA Unspecified injury of head, initial encounter: Secondary | ICD-10-CM | POA: Diagnosis not present

## 2016-11-12 DIAGNOSIS — G4489 Other headache syndrome: Secondary | ICD-10-CM | POA: Diagnosis not present

## 2016-11-12 DIAGNOSIS — G932 Benign intracranial hypertension: Secondary | ICD-10-CM | POA: Insufficient documentation

## 2016-11-12 MED FILL — GABAPENTIN 600 MG TABLET: 600 | 30 days supply | Qty: 90 | Fill #1

## 2016-11-13 DIAGNOSIS — G4489 Other headache syndrome: Secondary | ICD-10-CM | POA: Diagnosis not present

## 2016-11-13 DIAGNOSIS — G932 Benign intracranial hypertension: Secondary | ICD-10-CM | POA: Diagnosis not present

## 2016-11-13 DIAGNOSIS — S0990XD Unspecified injury of head, subsequent encounter: Secondary | ICD-10-CM | POA: Diagnosis not present

## 2016-11-18 MED FILL — metFORMIN HCL 1000 MG TABS: 1000 | 30 days supply | Qty: 60 | Fill #2

## 2016-11-19 DIAGNOSIS — S0990XA Unspecified injury of head, initial encounter: Secondary | ICD-10-CM | POA: Insufficient documentation

## 2016-11-19 MED FILL — CARVEDILOL 12.5 MG TABLET: 12.5 | 90 days supply | Qty: 180 | Fill #1

## 2016-11-20 DIAGNOSIS — N183 Chronic kidney disease, stage 3 (moderate): Secondary | ICD-10-CM | POA: Diagnosis not present

## 2016-11-20 DIAGNOSIS — I10 Essential (primary) hypertension: Secondary | ICD-10-CM | POA: Diagnosis not present

## 2016-11-26 ENCOUNTER — Telehealth: Payer: Self-pay | Admitting: Neurology

## 2016-11-26 NOTE — Telephone Encounter (Signed)
This patient saw you in [facility] office and is being referred to [place] for HA syndrome and benign intracranial hypertension. He has seen Dr Brett Fairy here for sleep issues but wants to see you now. Is it ok to switch to you? dg

## 2016-11-26 NOTE — Telephone Encounter (Signed)
Yes , of course ! C. Sherard Sutch

## 2016-11-26 NOTE — Telephone Encounter (Signed)
This patient has seen you for sleep issues and is being referred to [place] for HA syndrome and benign intracranial hypertension and is requesting Dr Felecia Shelling He saw Dr Felecia Shelling in [facility] office and wants to see him now Is it ok to schedule with Dr Felecia Shelling? dg

## 2016-11-26 NOTE — Telephone Encounter (Signed)
ok 

## 2016-12-03 NOTE — Telephone Encounter (Signed)
Completed close out

## 2016-12-07 ENCOUNTER — Other Ambulatory Visit: Payer: 59

## 2016-12-07 ENCOUNTER — Other Ambulatory Visit (INDEPENDENT_AMBULATORY_CARE_PROVIDER_SITE_OTHER): Payer: 59

## 2016-12-07 DIAGNOSIS — E1165 Type 2 diabetes mellitus with hyperglycemia: Secondary | ICD-10-CM

## 2016-12-07 LAB — COMPREHENSIVE METABOLIC PANEL
ALT: 32 U/L (ref 0–53)
AST: 24 U/L (ref 0–37)
Albumin: 4.2 g/dL (ref 3.5–5.2)
Alkaline Phosphatase: 81 U/L (ref 39–117)
BUN: 22 mg/dL (ref 6–23)
CO2: 20 meq/L (ref 19–32)
CREATININE: 1.16 mg/dL (ref 0.40–1.50)
Calcium: 9.5 mg/dL (ref 8.4–10.5)
Chloride: 105 mEq/L (ref 96–112)
GFR: 68.55 mL/min (ref >60.00)
GLUCOSE: 167 mg/dL — AB (ref 70–99)
Potassium: 4.7 mEq/L (ref 3.5–5.1)
SODIUM: 136 meq/L (ref 135–145)
Total Bilirubin: 0.8 mg/dL (ref 0.2–1.2)
Total Protein: 7.8 g/dL (ref 6.0–8.3)

## 2016-12-07 LAB — HEMOGLOBIN A1C: HEMOGLOBIN A1C: 6.5 % (ref 4.6–6.5)

## 2016-12-07 MED FILL — DULoxetine HCL 60 MG CPEP: 60 | 30 days supply | Qty: 60 | Fill #2

## 2016-12-07 MED FILL — ATORVASTATIN 10 MG TABLET: 10 | 90 days supply | Qty: 90 | Fill #1

## 2016-12-10 ENCOUNTER — Ambulatory Visit: Payer: 59 | Admitting: Endocrinology

## 2016-12-10 NOTE — Progress Notes (Signed)
Patient ID: James [name], male   DOB: [birth year], 61 y.o.   MRN: [id number]           Reason for Appointment: f/u for Type 2 Diabetes  Referring physician: [name]  History of Present Illness:          Date of diagnosis of type 2 diabetes mellitus:  ?  2014      Background history:  He is not clear how his diabetes was diagnosed, likely on routine testing. Initially had been treated with metformin and also tried on Amaryl Patient had progressive increase in his blood sugars since 1/17 with stopping metformin and being on the regimen of Amaryl and Januvia, he thinks his blood sugars went up to 601.  He was then started on basal bolus insulin   Recent history:   Non-insulin hypoglycemic drugs the patient is taking are: Trulicity 1.5 mg weekly, Metformin 2000 mg at dinner     His A1c is better at 6.5, previously higher at 6.9  Current management, blood sugar patterns and problems identified:  He has done better and has lost weight since his last visit  Continues to benefit from taking Trulicity and able to keep portions better controlled now  Also tolerating metformin maximum dose, recent renal function better  However he is taking metformin at bedtime instead of at mealtimes  He is however checking his blood sugars mostly in the morning hours and has only 5 readings in the last month  checked his blood sugars only very sporadically and has only 4 readings in the mornings for the last month including today,   Blood sugar recent range 136-148  He says currently he is a little more active with doing outside work but no formal exercise  Side effects from medications have been: None  Compliance with the medical regimen: Fair  Glucose monitoring:  done <1 times a day         Glucometer:  Contour Blood Glucose readings by download as above  Overall average   Self-care: The diet that the patient has been following is: Decreased sugar intake .     Meal times are:  Breakfast is  periodically skipped.  Lunch: 2-3 PM Dinner: 5-6 PM           Dietician visit, most recent:09/2015               Exercise: just some yardwork  Weight history:  Wt Readings from Last 3 Encounters:  12/11/16 250 lb 3.2 oz (113.5 kg)  10/01/16 255 lb 6.4 oz (115.8 kg)  09/23/16 257 lb (116.6 kg)    Glycemic control:   Lab Results  Component Value Date   HGBA1C 6.5 12/07/2016   HGBA1C 6.9 (H) 09/07/2016   HGBA1C 5.9 05/08/2016   Lab Results  Component Value Date   MICROALBUR <0.7 02/04/2016   LDLCALC 137 (H) 09/07/2016   CREATININE 1.16 12/07/2016    Lab on 12/07/2016  Component Date Value Ref Range Status  . Hgb A1c MFr Bld 12/07/2016 6.5  4.6 - 6.5 % Final   Glycemic Control Guidelines for People with Diabetes:Non Diabetic:  <6%Goal of Therapy: <7%Additional Action Suggested:  >8%   . Sodium 12/07/2016 136  135 - 145 mEq/L Final  . Potassium 12/07/2016 4.7  3.5 - 5.1 mEq/L Final  . Chloride 12/07/2016 105  96 - 112 mEq/L Final  . CO2 12/07/2016 20  19 - 32 mEq/L Final  . Glucose, Bld 12/07/2016 167* 70 - 99 mg/dL  Final  . BUN 12/07/2016 22  6 - 23 mg/dL Final  . Creatinine, Ser 12/07/2016 1.16  0.40 - 1.50 mg/dL Final  . Total Bilirubin 12/07/2016 0.8  0.2 - 1.2 mg/dL Final  . Alkaline Phosphatase 12/07/2016 81  39 - 117 U/L Final  . AST 12/07/2016 24  0 - 37 U/L Final  . ALT 12/07/2016 32  0 - 53 U/L Final  . Total Protein 12/07/2016 7.8  6.0 - 8.3 g/dL Final  . Albumin 12/07/2016 4.2  3.5 - 5.2 g/dL Final  . Calcium 12/07/2016 9.5  8.4 - 10.5 mg/dL Final  . GFR 12/07/2016 68.55  >60.00 mL/min Final       Allergies as of 12/11/2016      Reactions   Hydrocodone-homatropine Other (See Comments)   Depressed feeling   Morphine And Related Other (See Comments)   Hallucinations, back in the 80s. States has taken vicodin before w/o problems    Sulfa Drugs Cross Reactors Rash      Medication List       Accurate as of 12/11/16 11:59 PM. Always use your most recent  med list.          ACETAMINOPHEN PO Take 2 tablets by mouth every 6 (six) hours as needed (headache).   Adalimumab 40 MG/0.8ML Pnkt Commonly known as:  HUMIRA PEN Inject 0.8 mLs into the skin every 14 (fourteen) days.   atorvastatin 10 MG tablet Commonly known as:  LIPITOR Take 1 tablet (10 mg total) by mouth daily.   carvedilol 12.5 MG tablet Commonly known as:  COREG Take 1 tablet (12.5 mg total) by mouth 2 (two) times daily with a meal.   diclofenac sodium 1 % Gel Commonly known as:  VOLTAREN Apply 2 g topically 4 (four) times daily. Apply topically to affected area four times daily   DULoxetine 60 MG capsule Commonly known as:  CYMBALTA Take 2 capsules (120 mg total) by mouth daily.   FREESTYLE FREEDOM LITE w/Device Kit Use to check blood sugars 3 times daily. Dx code: E11.9   freestyle lancets Use to check blood sugar 3 times daily. Dx code E11.9   gabapentin 600 MG tablet Commonly known as:  NEURONTIN Take 1 tablet (600 mg total) by mouth 3 (three) times daily.   glucose blood test strip Commonly known as:  FREESTYLE LITE Use to check blood sugar 3 times daily. Dx code E11.9   metFORMIN 1000 MG tablet Commonly known as:  GLUCOPHAGE TAKE 1 TABLET BY MOUTH TWICE DAILY WITH A MEAL   omeprazole 40 MG capsule Commonly known as:  PRILOSEC Take 1 capsule (40 mg total) by mouth daily.   TRULICITY 1.5 ER/1.5QM Sopn Generic drug:  Dulaglutide INJECT THE CONTENTS OF ONE PEN ONCE PER WEEK       Allergies:  Allergies  Allergen Reactions  . Hydrocodone-Homatropine Other (See Comments)    Depressed feeling  . Morphine And Related Other (See Comments)    Hallucinations, back in the 80s. States has taken vicodin before w/o problems   . Sulfa Drugs Cross Reactors Rash    Past Medical History:  Diagnosis Date  . Acid reflux   . Cholelithiasis   . Chronic headaches    on cymbalta  . Cirrhosis ([place])   . Colon polyps   . Depression    on cymbalta  .  Diabetes mellitus with neuropathy ([place])    borderline per primary MD  . Diverticulitis 03/2013  . Eczema   . Elevated LFTs   .  Fatty liver   . Hypertension   . Insomnia 04/26/2013  . Kidney stone   . Morbid obesity ([place])   . Post-traumatic hydrocephalus    s/p shunts x 2 (first got infected )  . Psoriasis    sees Dr [name]  . Psoriatic arthritis ([place])   . 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 Stomach ulcer   . Testosterone deficiency 04/28/2011    Past Surgical History:  Procedure Laterality Date  . BACK SURGERY  1980  . SHOULDER SURGERY Left 2010  . TOE SURGERY Left 2018  . TOTAL HIP ARTHROPLASTY Left 2011  . VENTRICULOPERITONEAL SHUNT  2007   x2    Family History  Problem Relation Age of Onset  . Healthy Mother   . Lung cancer Father        alive, former smoker   . Heart disease Brother        MI age 67  . Other Brother        Murdered  . Down syndrome Son   . Diabetes Neg Hx   . Prostate cancer Neg Hx   . Colon cancer Neg Hx     Social History:  reports that he has never smoked. He has never used smokeless tobacco. He reports that he uses drugs, including Psilocybin. He reports that he does not drink alcohol.    Review of Systems    HYPERTENSION: Blood pressure appears to be better today, was high on his first measurement   BP Readings from Last 3 Encounters:  12/11/16 105/80  10/26/16 128/87  10/15/16 140/87    Lipid history: He is on fenofibrate for treatment, has mildly increased triglycerides LDL improved in April     Lab Results  Component Value Date   CHOL 158 09/28/2016   HDL 40.10 09/28/2016   LDLCALC 137 (H) 09/07/2016   LDLDIRECT 72.0 09/28/2016   TRIG 216.0 (H) 09/28/2016   CHOLHDL 4 09/28/2016            Most recent eye exam was  2017  Most recent foot exam: 07/2015  He has symptomatic neuropathy and is taking gabapentin twice a day and also Cymbalta  Review of Systems    Physical  Examination:  BP 105/80   Pulse 83   Ht _0  (1.753 m)   Wt 250 lb 3.2 oz (113.5 kg)   SpO2 96%   BMI 36.95 kg/m       ASSESSMENT:  Diabetes type 2, uncontrolled with BMI 38 See history of present illness for detailed discussion of current diabetes management, blood sugar patterns and problems identified  His blood sugars are much better as judged by his A1c of 6.5, previously 6.9 He has been a little more active and is finally losing a little weight However checking blood sugars infrequently and usually not after meals  HYPERLIPIDEMIA: Triglycerides are still high, will recheck on the next visit  CREATININE is back to normal  PLAN:    More consistent glucose monitoring especially after meals  He will need to cut back on his Pepsi and other sweet drinks  Check blood pressure at consistently  Take metformin at dinnertime instead of bedtime  Consider Ozempic and he will check on the coverage before the next visit    Patient Instructions  More sugars after dinner  Low sugar drinks  Check coverage for Ozempic, replaces Trulicity  BP checks   Metformin with dinner   Kory Panjwani 12/12/2016, 2:22 PM  Note: This office note was prepared with Estate agent. Any transcriptional errors that result from this process are unintentional.

## 2016-12-11 ENCOUNTER — Encounter: Payer: Self-pay | Admitting: Endocrinology

## 2016-12-11 ENCOUNTER — Ambulatory Visit (INDEPENDENT_AMBULATORY_CARE_PROVIDER_SITE_OTHER): Payer: 59 | Admitting: Endocrinology

## 2016-12-11 VITALS — BP 105/80 | HR 83 | Ht 69.0 in | Wt 250.2 lb

## 2016-12-11 DIAGNOSIS — E1165 Type 2 diabetes mellitus with hyperglycemia: Secondary | ICD-10-CM

## 2016-12-11 NOTE — Patient Instructions (Addendum)
More sugars after dinner  Low sugar drinks  Check coverage for Ozempic, replaces Trulicity  BP checks   Metformin with dinner

## 2016-12-23 MED FILL — HUMIRA PEN 40 MG/0.8ML PNKT: 40 | 28 days supply | Qty: 2 | Fill #0

## 2016-12-23 MED FILL — OMEPRAZOLE DR 40 MG CAPSULE: 40 | 30 days supply | Qty: 30 | Fill #1

## 2016-12-23 MED FILL — TRULICITY 1.5 MG/0.5 ML PEN: 1.5 | 28 days supply | Qty: 2 | Fill #3

## 2016-12-23 MED FILL — GABAPENTIN 600 MG TABLET: 600 | 30 days supply | Qty: 90 | Fill #1

## 2016-12-23 MED FILL — metFORMIN HCL 1000 MG TABS: 1000 | 60 days supply | Qty: 120 | Fill #0

## 2017-01-07 ENCOUNTER — Ambulatory Visit: Payer: Self-pay | Admitting: *Deleted

## 2017-01-07 ENCOUNTER — Encounter: Payer: Self-pay | Admitting: *Deleted

## 2017-01-07 ENCOUNTER — Other Ambulatory Visit: Payer: Self-pay | Admitting: *Deleted

## 2017-01-07 MED FILL — DULoxetine HCL 60 MG CPEP: 60 | 30 days supply | Qty: 60 | Fill #3

## 2017-01-07 NOTE — Patient Outreach (Addendum)
[facility]) Care Management   01/07/2017  James [name] 1955/11/10 [id number]  SRIKAR CHIANG is an 61 y.o. male who presents to the [facility] office for routine Link To Wellness follow up for self management assistance with Type II DM, HTN, hyperlipidemia  and obesity.  Subjective: James [name] says he is doing OK. He says he has recently been bothered with headaches and dizziness that sent him to the [facility] emergency room on 4/19. He was subsequently seen by his neurosurgeon in [facility], Dr. Rollene Rotunda on 4/20 for an adjustment to his VP shunt without resolution of headache so he underwent a lumbar puncture on 5/5. He saw his neurosurgeon in follow up again on 5/18. He says the dizziness is usually associated with position changes.  James [name] attributes his ongoing slow weight loss to working outside for long periods of time on his [employer] and large tanks and doing yard work. He continues to go for long periods of time without eating. He says he only checks his blood sugar if he does not feel well, he denies hypoglycemia since his last visit. He says he saw Dr. [name] on 6/15  and his Hgb A1C was improved at 6.5% previously 6.9%.  He says he resumed the Trulicity after he stopped it for a short time due to abdominal pain.  He says his psoriasis remains well controlled at present with Humira.  Objective:   Review of Systems  Constitutional: Negative.     Physical Exam  Constitutional: He is oriented to person, place, and time. He appears well-developed and well-nourished.  Respiratory: Effort normal.  Neurological: He is alert and oriented to person, place, and time.  Skin: Skin is warm and dry.  Psychiatric: He has a normal mood and affect. His behavior is normal. Judgment and thought content normal.   Today's Vitals   01/07/17 1129  SpO2: 97%  Weight: 249 lb 9.6 oz (113.2 kg)  Height: 1.753 m (5' 9")  PainSc: 0-No pain    Orthostatic Vitals taken in right arm: Sitting: BP= 122/82, P= 81 Standing: BP= 100/70, P= 94   Encounter Medications:   Outpatient Encounter Prescriptions as of 04/16/2016  Medication Sig Note  . Adalimumab (HUMIRA PEN) 40 MG/0.8ML PNKT Inject 0.8 mLs into the skin every 14 (fourteen) days.   . carvedilol (COREG) 12.5 MG tablet Take 1 tablet (12.5 mg total) by mouth 2 (two) times daily with a meal.   . DULoxetine (CYMBALTA) 60 MG capsule Take 2 capsules (120 mg total) by mouth daily.   Marland Kitchen gabapentin (NEURONTIN) 600 MG tablet Take 1 tablet (600 mg total) by mouth 3 (three) times daily. (Patient taking differently: Take 1,800 mg by mouth at bedtime. ) 04/16/2016: Takes two 600 mg tablets at night  . metFORMIN (GLUCOPHAGE) 1000 MG tablet TAKE 1 TABLET BY MOUTH TWICE DAILY WITH A MEAL 04/16/2016: At next refill, he will go to extended release  . omeprazole (PRILOSEC) 40 MG capsule Take 1 capsule (40 mg total) by mouth daily.   . TRULICITY 1.5 WI/0.9BD SOPN INJECT THE CONTENTS OF ONE PEN ONCE PER WEEK   . BAYER MICROLET LANCETS lancets Use as instructed to check blood sugar 3 times per day dx code E11.9 (Patient not taking: Reported on 03/09/2016)   . metFORMIN (GLUCOPHAGE) 1000 MG tablet Take 1 tablet (1,000 mg total) by mouth 2 (two) times daily with a meal. (Patient not taking: Reported on 04/16/2016) 53/29/9242: duplication  No facility-administered encounter medications on file as of 04/16/2016.     Functional Status:   In your present state of health, do you have any difficulty performing the following activities: 01/07/2017  Hearing? N  Vision? N  Difficulty concentrating or making decisions? N  Walking or climbing stairs? N  Dressing or bathing? N  Doing errands, shopping? N  Preparing Food and eating ? N  Using the Toilet? N  In the past six months, have you accidently leaked urine? N  Do you have problems with loss of bowel control? N  Managing your Medications? N  Managing  your Finances? N  Housekeeping or managing your Housekeeping? N  Some recent data might be hidden    Fall/Depression Screening:    PHQ 2/9 Scores 01/02/2016 10/24/2015 05/07/2015 04/08/2015 03/14/2015 01/28/2015 09/13/2014  PHQ - 2 Score 0 0 0 0 1 2 0  PHQ- 9 Score - - - - - 11 -    Assessment:  Spouse of [employer] employee with Type II DM, HTN, hyperlipidemia and obesity. Meeting treatment targets for HTN and Hgb A1C.  Most recent Hgb A1C= 6.5% on 12/07/16 previously 6.9.  Lipid profile on 09/28/16 showed elevated triglycerides a 216;  taking no medications, decreased body mass index= 36.9  Plan:   [facility] CM Care Plan Problem One        Most Recent Value   Care Plan Problem One  Link To Wellness member with Type II DM meeting Hgb A1C of <7.0%  as evidenced by Hgb A1C= 6.5% on 12/07/16, previously 6.9%,  morbid obesity but with ongoing slow eight loss with current body mass index= 36.9 and HTN and meeting treatment targets as evidenced by consistent BP readings <140/<90 but with symptom of orthostatic hypotension demonstrated with orthostatic vitals, hypertriglyceridemia with abnormal lipid profile on 09/28/16 and taking no medications   Role Documenting the Problem One  Care Management Coordinator   Care Plan for Problem One  Active   [facility] Long Term Goal (31-90 days)  Improved glycemic control as evidenced by Hgb A1C<6.5% without hypoglycemic episodes at next assessment, improved lipid profile at next check, ongoing good control of HTN as evidenced by consistent BP readings <140/<90, James [name] will monitor orthostatic blood pressure at home and take home monitor to his visit with Dr. [name] on 8/31 to assist with medication adjustment if needed, no evidence of weight gain or evidence of weight loss at next assessment   [facility] Long Term Goal Start Date  01/07/17   [facility] Long Term Goal Met Date     Interventions for Problem One Long Term Goal  Discussed workup and treatment of headaches and dizziness, assessed orthostatic  vitals and discussed results, encouraged James [name] to monitor his orthostatic blood pressure at home and take his monitor to his primary care provider visit on 8/31 in case he needs to have his HTN medications adjusted, discussed fall prevention strategies, reviewed medications and medication adherence, reviewed office visit notes of 6/15 with Dr. [name] and lab results of Hgb A1C, reviewed upcoming  appointment with Dr [name] on 02/26/17 and for labs on 9/19 and with Dr [name] on 9/21, since James [name] is not eligible to enroll in [facility] because he does not have a smart phone arranged for Link To Wellness follow up on 04/15/17     RNCM to fax today's office visit note to Dr. [name] and Dr. [name]. RNCM will meet quarterly and as needed with patient per Link To Wellness program guidelines to assist  with Type II DM, HTN, hyperlipidemia and obesity self-management and assess patient's progress toward mutually set goals.  Barrington Ellison RN,CCM,CDE [facility] Coordinator Link To Wellness Office Phone [phone] Office Fax [phone]

## 2017-01-14 ENCOUNTER — Ambulatory Visit (INDEPENDENT_AMBULATORY_CARE_PROVIDER_SITE_OTHER): Payer: 59 | Admitting: Neurology

## 2017-01-14 ENCOUNTER — Encounter: Payer: Self-pay | Admitting: Neurology

## 2017-01-14 VITALS — BP 140/90 | HR 76 | Resp 18 | Ht 69.0 in | Wt 250.0 lb

## 2017-01-14 DIAGNOSIS — G932 Benign intracranial hypertension: Secondary | ICD-10-CM

## 2017-01-14 DIAGNOSIS — L4050 Arthropathic psoriasis, unspecified: Secondary | ICD-10-CM

## 2017-01-14 DIAGNOSIS — Z9989 Dependence on other enabling machines and devices: Secondary | ICD-10-CM | POA: Diagnosis not present

## 2017-01-14 DIAGNOSIS — R51 Headache: Secondary | ICD-10-CM

## 2017-01-14 DIAGNOSIS — G4733 Obstructive sleep apnea (adult) (pediatric): Secondary | ICD-10-CM

## 2017-01-14 DIAGNOSIS — G4752 REM sleep behavior disorder: Secondary | ICD-10-CM | POA: Insufficient documentation

## 2017-01-14 DIAGNOSIS — G8929 Other chronic pain: Secondary | ICD-10-CM

## 2017-01-14 DIAGNOSIS — G4700 Insomnia, unspecified: Secondary | ICD-10-CM | POA: Diagnosis not present

## 2017-01-14 DIAGNOSIS — R519 Headache, unspecified: Secondary | ICD-10-CM

## 2017-01-14 HISTORY — DX: Benign intracranial hypertension: G93.2

## 2017-01-14 HISTORY — DX: REM sleep behavior disorder: G47.52

## 2017-01-14 MED ORDER — CLONAZEPAM 0.5 MG PO TABS: 0.5000 mg | ORAL_TABLET | Freq: Every day | ORAL | 5 refills | Status: DC

## 2017-01-14 MED ORDER — LEVETIRACETAM 750 MG PO TABS: 750.0000 mg | ORAL_TABLET | Freq: Two times a day (BID) | ORAL | 11 refills | Status: DC

## 2017-01-14 MED ORDER — CYCLOBENZAPRINE HCL 5 MG PO TABS: 5.0000 mg | ORAL_TABLET | Freq: Every day | ORAL | 11 refills | Status: DC

## 2017-01-14 MED FILL — CYCLOBENZAPRINE 5 MG TABLET: 5 | 30 days supply | Qty: 30 | Fill #0

## 2017-01-14 MED FILL — clonazePAM 0.5 MG TABS: 0.5 | 30 days supply | Qty: 30 | Fill #0

## 2017-01-14 MED FILL — levETIRAcetam 750 MG TABS: 750 | 30 days supply | Qty: 60 | Fill #0

## 2017-01-14 NOTE — Patient Instructions (Signed)
Take 1/2 pill levetiracetam twice a day for 3 days then take 1 pill twice a day
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
## 2017-02-05 ENCOUNTER — Other Ambulatory Visit: Payer: Self-pay | Admitting: Endocrinology

## 2017-02-05 ENCOUNTER — Other Ambulatory Visit: Payer: Self-pay | Admitting: Internal Medicine

## 2017-02-05 MED FILL — DULoxetine HCL 60 MG CPEP: 60 | 30 days supply | Qty: 60 | Fill #4

## 2017-02-05 MED FILL — TRULICITY 1.5 MG/0.5 ML PEN: 1.5 | 28 days supply | Qty: 2 | Fill #0

## 2017-02-08 DIAGNOSIS — Z79899 Other long term (current) drug therapy: Secondary | ICD-10-CM | POA: Diagnosis not present

## 2017-02-08 DIAGNOSIS — L409 Psoriasis, unspecified: Secondary | ICD-10-CM | POA: Diagnosis not present

## 2017-02-09 ENCOUNTER — Other Ambulatory Visit: Payer: Self-pay | Admitting: Pharmacist

## 2017-02-09 MED ORDER — ADALIMUMAB 40 MG/0.8ML ~~LOC~~ AJKT: 0.8000 mL | AUTO-INJECTOR | SUBCUTANEOUS | 5 refills | Status: DC

## 2017-02-09 MED FILL — HUMIRA PEN 40 MG/0.8ML PNKT: 40 | 28 days supply | Qty: 2 | Fill #0

## 2017-02-12 ENCOUNTER — Telehealth: Payer: Self-pay | Admitting: Internal Medicine

## 2017-02-16 ENCOUNTER — Other Ambulatory Visit: Payer: Self-pay | Admitting: Internal Medicine

## 2017-02-16 MED FILL — OMEPRAZOLE DR 40 MG CAPSULE: 40 | 90 days supply | Qty: 90 | Fill #1

## 2017-02-16 MED FILL — levETIRAcetam 750 MG TABS: 750 | 30 days supply | Qty: 60 | Fill #1

## 2017-02-16 MED FILL — GABAPENTIN 600 MG TABLET: 600 | 30 days supply | Qty: 90 | Fill #0

## 2017-02-16 MED FILL — CYCLOBENZAPRINE 5 MG TABLET: 5 | 30 days supply | Qty: 30 | Fill #1

## 2017-02-26 ENCOUNTER — Ambulatory Visit: Payer: 59 | Admitting: Internal Medicine

## 2017-02-26 NOTE — Telephone Encounter (Signed)
Completed.

## 2017-03-05 ENCOUNTER — Other Ambulatory Visit: Payer: Self-pay | Admitting: Endocrinology

## 2017-03-05 MED FILL — metFORMIN HCL 1000 MG TABS: 1000 | 30 days supply | Qty: 60 | Fill #0

## 2017-03-05 MED FILL — CARVEDILOL 12.5 MG TABLET: 12.5 | 60 days supply | Qty: 120 | Fill #2

## 2017-03-05 MED FILL — ATORVASTATIN 10 MG TABLET: 10 | 90 days supply | Qty: 90 | Fill #2

## 2017-03-05 MED FILL — DULoxetine HCL 60 MG CPEP: 60 | 30 days supply | Qty: 60 | Fill #5

## 2017-03-17 ENCOUNTER — Other Ambulatory Visit (INDEPENDENT_AMBULATORY_CARE_PROVIDER_SITE_OTHER): Payer: 59

## 2017-03-17 DIAGNOSIS — E1165 Type 2 diabetes mellitus with hyperglycemia: Secondary | ICD-10-CM

## 2017-03-17 LAB — COMPREHENSIVE METABOLIC PANEL
ALT: 24 U/L (ref 0–53)
AST: 22 U/L (ref 0–37)
Albumin: 3.9 g/dL (ref 3.5–5.2)
Alkaline Phosphatase: 77 U/L (ref 39–117)
BUN: 19 mg/dL (ref 6–23)
CHLORIDE: 105 meq/L (ref 96–112)
CO2: 23 meq/L (ref 19–32)
Calcium: 9.4 mg/dL (ref 8.4–10.5)
Creatinine, Ser: 1.27 mg/dL (ref 0.40–1.50)
GFR: 61.68 mL/min (ref >60.00)
GLUCOSE: 186 mg/dL — AB (ref 70–99)
POTASSIUM: 4.4 meq/L (ref 3.5–5.1)
Sodium: 136 mEq/L (ref 135–145)
Total Bilirubin: 0.8 mg/dL (ref 0.2–1.2)
Total Protein: 7.6 g/dL (ref 6.0–8.3)

## 2017-03-17 LAB — LIPID PANEL
Cholesterol: 159 mg/dL (ref 0–200)
HDL: 41.7 mg/dL (ref >39.00)
NONHDL: 117.77
Total CHOL/HDL Ratio: 4
Triglycerides: 292 mg/dL — ABNORMAL HIGH (ref 0.0–149.0)
VLDL: 58.4 mg/dL — AB (ref 0.0–40.0)

## 2017-03-17 LAB — LDL CHOLESTEROL, DIRECT: Direct LDL: 77 mg/dL

## 2017-03-17 LAB — HEMOGLOBIN A1C: HEMOGLOBIN A1C: 6.2 % (ref 4.6–6.5)

## 2017-03-19 ENCOUNTER — Ambulatory Visit (INDEPENDENT_AMBULATORY_CARE_PROVIDER_SITE_OTHER): Payer: 59 | Admitting: Internal Medicine

## 2017-03-19 ENCOUNTER — Encounter: Payer: Self-pay | Admitting: Endocrinology

## 2017-03-19 ENCOUNTER — Ambulatory Visit (INDEPENDENT_AMBULATORY_CARE_PROVIDER_SITE_OTHER): Payer: 59 | Admitting: Endocrinology

## 2017-03-19 ENCOUNTER — Encounter: Payer: Self-pay | Admitting: Internal Medicine

## 2017-03-19 VITALS — BP 128/78 | HR 78 | Temp 98.0°F | Resp 14 | Ht 69.0 in | Wt 254.4 lb

## 2017-03-19 VITALS — BP 116/86 | HR 81 | Temp 98.1°F | Resp 15 | Ht 69.0 in | Wt 252.0 lb

## 2017-03-19 DIAGNOSIS — E1122 Type 2 diabetes mellitus with diabetic chronic kidney disease: Secondary | ICD-10-CM | POA: Diagnosis not present

## 2017-03-19 DIAGNOSIS — N182 Chronic kidney disease, stage 2 (mild): Secondary | ICD-10-CM | POA: Diagnosis not present

## 2017-03-19 DIAGNOSIS — E785 Hyperlipidemia, unspecified: Secondary | ICD-10-CM | POA: Diagnosis not present

## 2017-03-19 DIAGNOSIS — E1165 Type 2 diabetes mellitus with hyperglycemia: Secondary | ICD-10-CM

## 2017-03-19 MED ORDER — FENOFIBRATE MICRONIZED 134 MG PO CAPS: 134.0000 mg | ORAL_CAPSULE | Freq: Every day | ORAL | 3 refills | Status: DC

## 2017-03-19 MED FILL — GABAPENTIN 600 MG TABLET: 600 | 30 days supply | Qty: 90 | Fill #1

## 2017-03-19 MED FILL — levETIRAcetam 750 MG TABS: 750 | 30 days supply | Qty: 60 | Fill #2

## 2017-03-19 MED FILL — HUMIRA PEN 40 MG/0.8ML PNKT: 40 | 28 days supply | Qty: 2 | Fill #1

## 2017-03-19 MED FILL — FENOFIBRATE 134 MG CAPSULE: 134 | 30 days supply | Qty: 30 | Fill #0

## 2017-03-19 MED FILL — CYCLOBENZAPRINE 5 MG TABLET: 5 | 30 days supply | Qty: 30 | Fill #2

## 2017-03-19 NOTE — Progress Notes (Signed)
Pre visit review using our clinic review tool, if applicable. No additional management support is needed unless otherwise documented below in the visit note. 

## 2017-03-19 NOTE — Patient Instructions (Addendum)
Take both METFORMIN at dinner  Check blood sugars on waking up  3/7  Also check blood sugars about 2 hours after a meal and do this after different meals by rotation  Recommended blood sugar levels on waking up is 90-130 and about 2 hours after meal is 130-160  Please bring your blood sugar monitor to each visit, thank you  Stop all sugar drinks, review diet  Walk briskly  30 min daily

## 2017-03-19 NOTE — Patient Instructions (Signed)
   GO TO THE FRONT DESK Schedule your next appointment for a  Physical in 8 months

## 2017-03-19 NOTE — Progress Notes (Signed)
Patient ID: James [name], male   DOB: 1955-08-01, 61 y.o.   MRN: [id number]           Reason for Appointment: Follow-up for Type 2 Diabetes  Referring physician: [name]  History of Present Illness:          Date of diagnosis of type 2 diabetes mellitus:  ?  2014      Background history:  He is not clear how his diabetes was diagnosed, likely on routine testing. Initially had been treated with metformin and also tried on Amaryl Patient had progressive increase in his blood sugars since 1/17 with stopping metformin and being on the regimen of Amaryl and Januvia, he thinks his blood sugars went up to 601.  He was then started on basal bolus insulin   Recent history:   Non-insulin hypoglycemic drugs the patient is taking are: Trulicity 1.5 mg weekly, Metformin 1 g twice a day  His A1c is better at 6.2 and improving  Current management, blood sugar patterns and problems identified:  He has has not checked his blood sugar much and only sporadically in the morning, did not bring his monitor for download  Even though his A1c is better he cannot explain why his lab glucose was 185 fasting  He does not think his blood sugars are as high as this at home  He does not remember to check readings after meals  Although he has been advised to cut back on high sugar drinks he is still drinking regular soft drinks and Gatorade  He has seen the dietitian but does not always follow instructions  Blood sugar recent range 136-148 at home, mostly fastings He tries to be active with outside work but not doing any formal exercise of walking despite reminders  Side  effects from medications have been: None  Compliance with the medical regimen: Fair  Glucose monitoring:  done <1 times a day         Glucometer:  Contour Blood Glucose readings by 130-150 am  Self-care: The diet that the patient has been following is:   reducing fried food   Meal times are:  Breakfast is periodically skipped.  Lunch:  2-3 PM Dinner: 5-6 PM           Dietician visit, most recent:09/2015               Exercise: just some yardwork  Weight history:  Wt Readings from Last 3 Encounters:  03/19/17 252 lb (114.3 kg)  01/14/17 250 lb (113.4 kg)  01/07/17 249 lb 9.6 oz (113.2 kg)    Glycemic control:   Lab Results  Component Value Date   HGBA1C 6.2 03/17/2017   HGBA1C 6.5 12/07/2016   HGBA1C 6.9 (H) 09/07/2016   Lab Results  Component Value Date   MICROALBUR <0.7 02/04/2016   LDLCALC 137 (H) 09/07/2016   CREATININE 1.27 03/17/2017    Lab on 03/17/2017  Component Date Value Ref Range Status  . Sodium 03/17/2017 136  135 - 145 mEq/L Final  . Potassium 03/17/2017 4.4  3.5 - 5.1 mEq/L Final  . Chloride 03/17/2017 105  96 - 112 mEq/L Final  . CO2 03/17/2017 23  19 - 32 mEq/L Final  . Glucose, Bld 03/17/2017 186* 70 - 99 mg/dL Final  . BUN 03/17/2017 19  6 - 23 mg/dL Final  . Creatinine, Ser 03/17/2017 1.27  0.40 - 1.50 mg/dL Final  . Total Bilirubin 03/17/2017 0.8  0.2 - 1.2 mg/dL Final  .  Alkaline Phosphatase 03/17/2017 77  39 - 117 U/L Final  . AST 03/17/2017 22  0 - 37 U/L Final  . ALT 03/17/2017 24  0 - 53 U/L Final  . Total Protein 03/17/2017 7.6  6.0 - 8.3 g/dL Final  . Albumin 03/17/2017 3.9  3.5 - 5.2 g/dL Final  . Calcium 03/17/2017 9.4  8.4 - 10.5 mg/dL Final  . GFR 03/17/2017 61.68  >60.00 mL/min Final  . Cholesterol 03/17/2017 159  0 - 200 mg/dL Final   ATP III Classification       Desirable:  < 200 mg/dL               Borderline High:  200 - 239 mg/dL          High:  > = 240 mg/dL  . Triglycerides 03/17/2017 292.0* 0.0 - 149.0 mg/dL Final   Normal:  <150 mg/dLBorderline High:  150 - 199 mg/dL  . HDL 03/17/2017 41.70  >39.00 mg/dL Final  . VLDL 03/17/2017 58.4* 0.0 - 40.0 mg/dL Final  . Total CHOL/HDL Ratio 03/17/2017 4   Final                  Men          Women1/2 Average Risk     3.4          3.3Average Risk          5.0          4.42X Average Risk          9.6          7.13X  Average Risk          15.0          11.0                      . NonHDL 03/17/2017 117.77   Final   NOTE:  Non-HDL goal should be 30 mg/dL higher than patient's LDL goal (i.e. LDL goal of < 70 mg/dL, would have non-HDL goal of < 100 mg/dL)  . Hgb A1c MFr Bld 03/17/2017 6.2  4.6 - 6.5 % Final   Glycemic Control Guidelines for People with Diabetes:Non Diabetic:  <6%Goal of Therapy: <7%Additional Action Suggested:  >8%   . Direct LDL 03/17/2017 77.0  mg/dL Final   Optimal:  <100 mg/dLNear or Above Optimal:  100-129 mg/dLBorderline High:  130-159 mg/dLHigh:  160-189 mg/dLVery High:  >190 mg/dL       Allergies as of 03/19/2017      Reactions   Hydrocodone-homatropine Other (See Comments)   Depressed feeling   Morphine And Related Other (See Comments)   Hallucinations, back in the 80s. States has taken vicodin before w/o problems    Sulfa Drugs Cross Reactors Rash      Medication List       Accurate as of 03/19/17 12:36 PM. Always use your most recent med list.          ACETAMINOPHEN PO Take 2 tablets by mouth every 6 (six) hours as needed (headache).   Adalimumab 40 MG/0.8ML Pnkt Commonly known as:  HUMIRA PEN Inject 0.8 mLs into the skin every 14 (fourteen) days.   atorvastatin 10 MG tablet Commonly known as:  LIPITOR Take 1 tablet (10 mg total) by mouth daily.   carvedilol 12.5 MG tablet Commonly known as:  COREG Take 1 tablet (12.5 mg total) by mouth 2 (two) times daily with a meal.   clonazePAM  0.5 MG tablet Commonly known as:  KLONOPIN Take 1 tablet (0.5 mg total) by mouth at bedtime. Fax to [facility]  [phone]   cyclobenzaprine 5 MG tablet Commonly known as:  FLEXERIL Take 1 tablet (5 mg total) by mouth at bedtime.   DULoxetine 60 MG capsule Commonly known as:  CYMBALTA Take 2 capsules (120 mg total) by mouth daily.   fenofibrate micronized 134 MG capsule Commonly known as:  LOFIBRA Take 1 capsule (134 mg total) by mouth daily before breakfast.     FREESTYLE FREEDOM LITE w/Device Kit Use to check blood sugars 3 times daily. Dx code: E11.9   freestyle lancets Use to check blood sugar 3 times daily. Dx code E11.9   gabapentin 600 MG tablet Commonly known as:  NEURONTIN Take 1 tablet (600 mg total) by mouth 3 (three) times daily.   glucose blood test strip Commonly known as:  FREESTYLE LITE Use to check blood sugar 3 times daily. Dx code E11.9   levETIRAcetam 750 MG tablet Commonly known as:  KEPPRA Take 1 tablet (750 mg total) by mouth 2 (two) times daily.   metFORMIN 1000 MG tablet Commonly known as:  GLUCOPHAGE TAKE 1 TABLET BY MOUTH TWICE DAILY WITH A MEAL   omeprazole 40 MG capsule Commonly known as:  PRILOSEC Take 1 capsule (40 mg total) by mouth daily.   TRULICITY 1.5 XA/0.3VQ Sopn Generic drug:  Dulaglutide INJECT THE CONTENTS OF ONE PEN ONCE PER WEEK            Discharge Care Instructions        Start     Ordered   03/19/17 0000  Hemoglobin A1c     03/19/17 1118   03/19/17 0000  Lipid panel     03/19/17 1118   03/19/17 0000  Comprehensive metabolic panel     56/46/98 1118   03/19/17 0000  Microalbumin / creatinine urine ratio     03/19/17 1118   03/19/17 0000  fenofibrate micronized (LOFIBRA) 134 MG capsule  Daily before breakfast     03/19/17 1118      Allergies:  Allergies  Allergen Reactions  . Hydrocodone-Homatropine Other (See Comments)    Depressed feeling  . Morphine And Related Other (See Comments)    Hallucinations, back in the 80s. States has taken vicodin before w/o problems   . Sulfa Drugs Cross Reactors Rash    Past Medical History:  Diagnosis Date  . Acid reflux   . Cholelithiasis   . Chronic headaches    on cymbalta  . Cirrhosis ([place])   . Colon polyps   . Depression    on cymbalta  . Diabetes mellitus with neuropathy ([place])    borderline per primary MD  . Diverticulitis 03/2013  . Eczema   . Elevated LFTs   . Fatty liver   . Hypertension   . Insomnia  04/26/2013  . Kidney stone   . Morbid obesity ([place])   . Post-traumatic hydrocephalus    s/p shunts x 2 (first got infected )  . Psoriasis    sees Dr [name]  . Psoriatic arthritis ([place])   . 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 Stomach ulcer   . Testosterone deficiency 04/28/2011    Past Surgical History:  Procedure Laterality Date  . BACK SURGERY  1980  . SHOULDER SURGERY Left 2010  . TOE SURGERY Left 2018  . TOTAL HIP ARTHROPLASTY Left 2011  . VENTRICULOPERITONEAL SHUNT  2007  x2    Family History  Problem Relation Age of Onset  . Healthy Mother   . Lung cancer Father        alive, former smoker   . Heart disease Brother        MI age 29  . Other Brother        Murdered  . Down syndrome Son   . Diabetes Neg Hx   . Prostate cancer Neg Hx   . Colon cancer Neg Hx     Social History:  reports that he has never smoked. He has never used smokeless tobacco. He reports that he uses drugs, including Psilocybin. He reports that he does not drink alcohol.    Review of Systems    HYPERTENSION: Blood pressure  has been somewhat variable Followed by PCP   BP Readings from Last 3 Encounters:  03/19/17 116/86  01/14/17 140/90  12/11/16 105/80    Lipid history: He is on Lipitor only with good control of LDL    Was previously also on fenofibrate for treatment, has increased triglycerides     Lab Results  Component Value Date   CHOL 159 03/17/2017   HDL 41.70 03/17/2017   LDLCALC 137 (H) 09/07/2016   LDLDIRECT 77.0 03/17/2017   TRIG 292.0 (H) 03/17/2017   CHOLHDL 4 03/17/2017            Most recent eye exam was  2017  Most recent foot exam: 12/2015  He has symptomatic neuropathy and is taking gabapentin twice a day and  Cymbalta  Review of Systems    Physical Examination:  BP 116/86 (BP Location: Left Arm, Patient Position: Sitting, Cuff Size: Normal)   Pulse 81   Temp 98.1 F (36.7 C) (Oral)   Resp 15   Ht 5'  9" (1.753 m)   Wt 252 lb (114.3 kg)   SpO2 97%   BMI 37.21 kg/m       ASSESSMENT:  Diabetes type 2, uncontrolled with BMI 38 See history of present illness for detailed discussion of current diabetes management, blood sugar patterns and problems identified  His blood sugars are Still well controlled with A1c 6.2 However his lab glucose was 185 even though he reports readings below 140 usually at home Overall still has low motivation for exercising and not able to lose weight despite continuing Trulicity He does not monitor after meals  HYPERLIPIDEMIA: Triglycerides are still high, will need more treatment than Lipitor alone   PLAN:    More consistent glucose monitoring especially after meals, needs to bring his monitor  He will need to cut back on his drinking regular drinks including Gatorade  Check blood pressure at home consistently  Take metformin altogether at dinnertime instead of twice a day  Consider Ozempic if covered  Fenofibrate 134 mg daily  Needs follow-up fasting lipids  Encouraged him to start walking at least every other day    Patient Instructions  Take both METFORMIN at dinner  Check blood sugars on waking up  3/7  Also check blood sugars about 2 hours after a meal and do this after different meals by rotation  Recommended blood sugar levels on waking up is 90-130 and about 2 hours after meal is 130-160  Please bring your blood sugar monitor to each visit, thank you  Stop all sugar drinks, review diet  Walk briskly  30 min daily   Leena Tiede 03/19/2017, 12:36 PM   Note: This office note was prepared with Dragon voice recognition  system technology. Any transcriptional errors that result from this process are unintentional.

## 2017-03-19 NOTE — Progress Notes (Signed)
Subjective:    Patient ID: James [name], male    DOB: [birth year], 61 y.o.   MRN: [id number]  DOS:  03/19/2017 Type of visit - description : f/u Interval history: Since the last office visit, saw nephrology, note reviewed. Saw endocrinology today, labs were order. Good compliance of medication, no ambulatory BPs   Review of Systems Diet exercise: About the same, reports that he oftentimes skips meals and sometimes drink sugary sodas  or liquids. No anxiety or depression  Past Medical History:  Diagnosis Date  . Acid reflux   . Cholelithiasis   . Chronic headaches    on cymbalta  . Cirrhosis ([place])   . Colon polyps   . Depression    on cymbalta  . Diabetes mellitus with neuropathy ([place])    borderline per primary MD  . Diverticulitis 03/2013  . Eczema   . Elevated LFTs   . Fatty liver   . Hypertension   . Insomnia 04/26/2013  . Kidney stone   . Morbid obesity ([place])   . Post-traumatic hydrocephalus    s/p shunts x 2 (first got infected )  . Psoriasis    sees Dr [name]  . Psoriatic arthritis ([place])   . 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 Stomach ulcer   . Testosterone deficiency 04/28/2011    Past Surgical History:  Procedure Laterality Date  . BACK SURGERY  1980  . SHOULDER SURGERY Left 2010  . TOE SURGERY Left 2018  . TOTAL HIP ARTHROPLASTY Left 2011  . VENTRICULOPERITONEAL SHUNT  2007   x2    Social History   Social History  . Marital status: Married    Spouse name: [name]  . Number of children: 2  . Years of education: N/A   Occupational History  . disable     Social History Main Topics  . Smoking status: Never Smoker  . Smokeless tobacco: Never Used  . Alcohol use No  . Drug use: Yes    Types: Psilocybin  . Sexual activity: Not on file   Other Topics Concern  . 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      Allergies as of 03/19/2017      Reactions   Hydrocodone-homatropine Other (See Comments)   Depressed feeling   Morphine And Related Other (See Comments)   Hallucinations, back in the 80s. States has taken vicodin before w/o problems    Sulfa Drugs Cross Reactors Rash      Medication List       Accurate as of 03/19/17 11:59 PM. Always use your most recent med list.          ACETAMINOPHEN PO Take 2 tablets by mouth every 6 (six) hours as needed (headache).   Adalimumab 40 MG/0.8ML Pnkt Commonly known as:  HUMIRA PEN Inject 0.8 mLs into the skin every 14 (fourteen) days.   atorvastatin 10 MG tablet Commonly known as:  LIPITOR Take 1 tablet (10 mg total) by mouth daily.   carvedilol 12.5 MG tablet Commonly known as:  COREG Take 1 tablet (12.5 mg total) by mouth 2 (two) times daily with a meal.   clonazePAM 0.5 MG tablet Commonly known as:  KLONOPIN Take 1 tablet (0.5 mg total) by mouth at bedtime. Fax to [facility]  [phone]   cyclobenzaprine 5 MG tablet Commonly known as:  FLEXERIL Take 1 tablet (5 mg total) by mouth at bedtime.   DULoxetine 60 MG capsule Commonly known as:  CYMBALTA Take 2 capsules (120 mg total) by mouth daily.   fenofibrate micronized 134 MG capsule Commonly known as:  LOFIBRA Take 1 capsule (134 mg total) by mouth daily before breakfast.   FREESTYLE FREEDOM LITE w/Device Kit Use to check blood sugars 3 times daily. Dx code: E11.9   freestyle lancets Use to check blood sugar 3 times daily. Dx code E11.9   gabapentin 600 MG tablet Commonly known as:  NEURONTIN Take 1 tablet (600 mg total) by mouth 3 (three) times daily.   glucose blood test strip Commonly known as:  FREESTYLE LITE Use to check blood sugar 3 times daily. Dx code E11.9   levETIRAcetam 750 MG tablet Commonly known as:  KEPPRA Take 1 tablet (750 mg total) by mouth 2 (two) times daily.     metFORMIN 1000 MG tablet Commonly known as:  GLUCOPHAGE TAKE 1 TABLET BY MOUTH TWICE DAILY WITH A MEAL   omeprazole 40 MG capsule Commonly known as:  PRILOSEC Take 1 capsule (40 mg total) by mouth daily.   TRULICITY 1.5 BZ/1.6RC Sopn Generic drug:  Dulaglutide INJECT THE CONTENTS OF ONE PEN ONCE PER WEEK          Objective:   Physical Exam BP 128/78 (BP Location: Left Arm, Patient Position: Sitting, Cuff Size: Normal)   Pulse 78   Temp 98 F (36.7 C) (Oral)   Resp 14   Ht _0  (1.753 m)   Wt 254 lb 6 oz (115.4 kg)   SpO2 96%   BMI 37.56 kg/m  General:   Well developed, well nourished . NAD.  HEENT:  Normocephalic . Face symmetric, atraumatic Lungs:  CTA B Normal respiratory effort, no intercostal retractions, no accessory muscle use. Heart: RRR,  no murmur.  No pretibial edema bilaterally  Skin: Not pale. Not jaundice Neurologic:  alert & oriented X3.  Speech normal, gait appropriate for age and unassisted Psych--  Cognition and judgment appear intact.  Cooperative with normal attention span and concentration.  Behavior appropriate. No anxious or depressed appearing.      Assessment & Plan:   Assessment  DM Neuropathy (x years, rx gaba 05-2014, w/u 11-2014  RPR neg, vit D-B12-Folic Acid wnl ); Saw Dr Posey Pronto, [place] 1. The electrophysiologic findings are most consistent with a chronic, distal and symmetric sensorimotor polyneuropathy, predominantly axon loss in type, affecting the lower extremities. 2. There is no evidence of a superimposed lumbosacral radiculopathy. CRI HTN Hyperlipidemia  OSA , dx 2012, sleep study again 02-2015 Dr Dohmeier--> severe OSA, intolerant to CPAP Depression, insomnia ----on Cymbalta Chronic headaches -----on Cymbalta MSK: on disability d/t back pain- HAs Hypogonadism  Dx 2012, normal T 11-2014 (on no RX) GI:  --GERD, diverticulitis 2014, h/o PUD --Fatty liver per [place] 05-2014 (? Of cirrhosis per CT 2014) -- NASH per GI  note 08-2015  Psoriasis, psoriatic arthritis -- on HUMIRA  Posttraumatic hydrocephalus s/p 2 shunts (first got infected) H/u urolithiasis +FH CAD brother MI age 20  PLAN Labs from today reviewed. DM: Per endocrinology, last A1c satisfactory. Dietary advice provided, he does not seem to be motivated to change. Chronic renal insufficiency: Saw nephrology 10/2016, felt to be stable, follow-up in one year. Creatinine today 1.27. Stable HTN: No ambulatory BPs. BP today is very good. High cholesterol: LDL today 77, continue  atorvastatin and fenofibrate. Depression and insomnia: Well control. Continue Cymbalta Declined a flu shot. RTC 8 months, CPX

## 2017-03-20 NOTE — Assessment & Plan Note (Signed)
Labs from today reviewed. DM: Per endocrinology, last A1c satisfactory. Dietary advice provided, he does not seem to be motivated to change. Chronic renal insufficiency: Saw nephrology 10/2016, felt to be stable, follow-up in one year. Creatinine today 1.27. Stable HTN: No ambulatory BPs. BP today is very good. High cholesterol: LDL today 77, continue atorvastatin and fenofibrate. Depression and insomnia: Well control. Continue Cymbalta Declined a flu shot. RTC 8 months, CPX

## 2017-04-08 ENCOUNTER — Other Ambulatory Visit: Payer: Self-pay | Admitting: Internal Medicine

## 2017-04-08 MED FILL — DULoxetine HCL 60 MG CPEP: 60 | 30 days supply | Qty: 60 | Fill #0

## 2017-04-08 MED FILL — clonazePAM 0.5 MG TABS: 0.5 | 30 days supply | Qty: 30 | Fill #1

## 2017-04-09 MED FILL — metFORMIN HCL 1000 MG TABS: 1000 | 90 days supply | Qty: 180 | Fill #1

## 2017-04-15 ENCOUNTER — Telehealth: Payer: Self-pay | Admitting: Neurology

## 2017-04-15 ENCOUNTER — Encounter: Payer: Self-pay | Admitting: *Deleted

## 2017-04-15 ENCOUNTER — Other Ambulatory Visit: Payer: Self-pay | Admitting: *Deleted

## 2017-04-15 DIAGNOSIS — M1990 Unspecified osteoarthritis, unspecified site: Secondary | ICD-10-CM

## 2017-04-15 DIAGNOSIS — R292 Abnormal reflex: Secondary | ICD-10-CM

## 2017-04-15 DIAGNOSIS — G4489 Other headache syndrome: Secondary | ICD-10-CM

## 2017-04-15 NOTE — Addendum Note (Signed)
Addended by: France Ravens I on: 04/15/2017 04:33 PM   Modules accepted: Orders

## 2017-04-15 NOTE — Telephone Encounter (Signed)
MRI c-spine was to be ordered at last ov, but for some reason, was not completed.  Spoke with RAS and he would still like MRI to be done.  Spoke with pt. and he is agreeable. Order placed in EPIC today./fim

## 2017-04-15 NOTE — Patient Outreach (Signed)
[facility]) Care Management  04/15/2017  James [name] 11-09-1955 [id number]  James [name] is an 61 y.o. male who presents to the [place] Management office for routine Link To Wellness follow up for self management assistance with Type II DM, HTN, hyperlipidemia  and obesity.  Subjective: James [name] says he is doing OK but says he sleeps a lot during the day.  When questioned about his unsteady gait he says " I'm always like this".   He continues to go for long periods of time without eating. He says he only checks his blood sugar if he does not feel well, he denies hypoglycemia since his last visit. He says he saw Dr. [name] on 9/21 and his Hgb A1C was improved again at 6.2% previously 6.5%. He remains on Metformin and Trulicity.  He says he saw his neurologist Dr. Felecia Shelling on 7/19 and had new medications added to treat his headaches and sleep disorder. He says he cannot tolerate a CPAP mask even though he has OSA. He says he saw Dr. [name] on 9/21 and declined the flu shot. He says his psoriasis remains well controlled at present with Humira.  Objective:   James [name]'s gait was initially quite unsteady when obtaining his standing weight, it was steady when he walked out of the office at the end of his appointment.   Review of Systems  Constitutional: Negative.     Physical Exam  Constitutional: He is oriented to person, place, and time. He appears well-developed and well-nourished.  Respiratory: Effort normal.  Neurological: He is alert and oriented to person, place, and time.  Skin: Skin is warm and dry.  Psychiatric: He has a normal mood and affect. His behavior is normal. Judgment and thought content normal.   Today's Vitals   04/15/17 1053  BP: 96/76  Weight: 255 lb (115.7 kg)  Height: 1.753 m (5' 9" )      Outpatient Encounter Prescriptions as of 04/15/2017  Medication Sig  . Adalimumab (HUMIRA PEN) 40 MG/0.8ML PNKT Inject 0.8 mLs  into the skin every 14 (fourteen) days.  Marland Kitchen atorvastatin (LIPITOR) 10 MG tablet Take 1 tablet (10 mg total) by mouth daily.  . carvedilol (COREG) 12.5 MG tablet Take 1 tablet (12.5 mg total) by mouth 2 (two) times daily with a meal.  . clonazePAM (KLONOPIN) 0.5 MG tablet Take 1 tablet (0.5 mg total) by mouth at bedtime. Fax to [employer]  [phone]  . cyclobenzaprine (FLEXERIL) 5 MG tablet Take 1 tablet (5 mg total) by mouth at bedtime.  . DULoxetine (CYMBALTA) 60 MG capsule Take 2 capsules (120 mg total) by mouth daily.  . fenofibrate micronized (LOFIBRA) 134 MG capsule Take 1 capsule (134 mg total) by mouth daily before breakfast.  . gabapentin (NEURONTIN) 600 MG tablet Take 1 tablet (600 mg total) by mouth 3 (three) times daily.  Marland Kitchen levETIRAcetam (KEPPRA) 750 MG tablet Take 1 tablet (750 mg total) by mouth 2 (two) times daily.  . metFORMIN (GLUCOPHAGE) 1000 MG tablet TAKE 1 TABLET BY MOUTH TWICE DAILY WITH A MEAL  . omeprazole (PRILOSEC) 40 MG capsule Take 1 capsule (40 mg total) by mouth daily.  . TRULICITY 1.5 OM/7.6HM SOPN INJECT THE CONTENTS OF ONE PEN ONCE PER WEEK  . ACETAMINOPHEN PO Take 2 tablets by mouth every 6 (six) hours as needed (headache).  . Blood Glucose Monitoring Suppl (FREESTYLE FREEDOM LITE) w/Device KIT Use to check blood sugars 3 times daily. Dx code: E11.9  .  glucose blood (FREESTYLE LITE) test strip Use to check blood sugar 3 times daily. Dx code E11.9  . Lancets (FREESTYLE) lancets Use to check blood sugar 3 times daily. Dx code E11.9   No facility-administered encounter medications on file as of 04/15/2017.      Functional Status:   In your present state of health, do you have any difficulty performing the following activities: 01/07/2017  Hearing? N  Vision? N  Difficulty concentrating or making decisions? N  Walking or climbing stairs? N  Dressing or bathing? N  Doing errands, shopping? N  Preparing Food and eating ? N  Using the Toilet? N  In the past  six months, have you accidently leaked urine? N  Do you have problems with loss of bowel control? N  Managing your Medications? N  Managing your Finances? N  Housekeeping or managing your Housekeeping? N  Some recent data might be hidden    Fall/Depression Screening:    PHQ 2/9 Scores 01/02/2016 10/24/2015 05/07/2015 04/08/2015 03/14/2015 01/28/2015 09/13/2014  PHQ - 2 Score 0 0 0 0 1 2 0  PHQ- 9 Score - - - - - 11 -    Assessment:  Spouse of James [name] employee with Type II DM, HTN, hyperlipidemia and obesity. Meeting treatment targets for HTN and Hgb A1C.  Most recent Hgb A1C= 6.2% on 03/17/17 previously 6.5%.  Lipid profile on 03/17/17 showed ongoing elevated triglycerides a 216 ;  Now taking Lipitor (started 09/10/16) and fenofibrate (started 03/19/17), obese with increased  body mass index= 37.64  Plan:   [facility] CM Care Plan Problem One        Most Recent Value   Care Plan Problem One  Link To Wellness member with Type II DM meeting Hgb A1C of <7.0%  as evidenced by Hgb A1C= 6.2% on 03/17/17, previously 6.5%,  morbid obesity but with weight gain with current body mass index= 367.64  and HTN and meeting treatment targets as evidenced by consistent BP readings <140/<90 but reading low normal today,  hypertriglyceridemia with abnormal lipid profile on 03/17/17 now on lipitor and fenofibrate, intermittent unsteady gait likely secondary to side effects of medications to treat headache and sleep disorder   Role Documenting the Problem One  Care Management Coordinator   Care Plan for Problem One  Active   [facility] Long Term Goal (31-90 days)  Ongoing well managed glycemic control as evidenced by Hgb A1C<6.5% without hypoglycemic episodes at next assessment, improved lipid profile at next check, ongoing good control of HTN as evidenced by consistent BP readings <140/<90, James [name] will discuss drowsiness and unsteady gait with Dr. Felecia Shelling on 10/31,  no evidence of weight gain or evidence of weight loss at next assessment,  James [name] will onboard to the [employer] and be an active participant   [place] Term Goal Start Date  04/15/17   [facility] Long Term Goal Met Date     Interventions for Problem One Long Term Goal  Discussed workup and treatment of headaches and dizziness, reviewed office visit notes of Dr Garth Bigness on 7/19 and called Dr. Garth Bigness office and left message asking if James [name] is to have the MRI of his C spine as discussed in the note,  assessed blood pressure and discussed fall prevention strategies, reviewed medications and medication adherence, discussed common side effects of Flexeril, Klonopin and Keppra, reviewed office visit notes of 9/21 with Dr. [name] and Dr [name] and reviewed lab results of Hgb A1C and lipid profile of 03/17/17  and the addition of fenofibrate to treat his chronically elevated triglycerides, reviewed upcoming appointment  with Dr. Felecia Shelling on 04/28/17, for labs on 07/15/17, then with Dr [name] on 07/19/17, facilitated James [name]'s meeting with Danella Penton for [facility] onboarding but unable to complete onboarding because he did not know his [employer] ID number, arranged for Link To Wellness follow up in January 2019 in the event he is not a participant in the [place] to fax today's office visit note to Dr. [name] and Dr. [name]. RNCM will meet quarterly and as needed with patient per Link To Wellness program guidelines to assist with Type II DM, HTN, hyperlipidemia and obesity self-management and assess patient's progress toward mutually set goals.  Barrington Ellison RN,CCM,CDE [place] Management Coordinator Link To Wellness Office Phone [phone] Office Fax [phone]

## 2017-04-15 NOTE — Telephone Encounter (Signed)
Lovie Chol RN Case manager with [facility] saw pt and asked him when he had his MRI based on her reading Dr Garth Bigness notes from 7-19 that one would be looked into for pt's cervical spine.  Pt informed her he did not have one.  RN Marcie Bal is asking for a call to know more about why it was not done.  Marcie Bal can be reached at [phone]

## 2017-04-16 ENCOUNTER — Other Ambulatory Visit: Payer: Self-pay | Admitting: Neurology

## 2017-04-16 MED FILL — CYCLOBENZAPRINE 5 MG TABLET: 5 | 30 days supply | Qty: 30 | Fill #3

## 2017-04-16 MED FILL — FENOFIBRATE 134 MG CAPSULE: 134 | 30 days supply | Qty: 30 | Fill #1

## 2017-04-20 ENCOUNTER — Ambulatory Visit: Payer: 59 | Admitting: Neurology

## 2017-04-21 MED FILL — TRULICITY 1.5 MG/0.5 ML PEN: 1.5 | 28 days supply | Qty: 2 | Fill #1

## 2017-04-21 MED FILL — HUMIRA PEN 40 MG/0.8ML PNKT: 40 | 28 days supply | Qty: 2 | Fill #2

## 2017-04-27 ENCOUNTER — Ambulatory Visit
Admission: RE | Admit: 2017-04-27 | Discharge: 2017-04-27 | Disposition: A | Discharge status: Home / Self Care | Payer: 59 | Source: Ambulatory Visit | Attending: Neurology | Admitting: Neurology

## 2017-04-27 DIAGNOSIS — M47812 Spondylosis without myelopathy or radiculopathy, cervical region: Secondary | ICD-10-CM | POA: Diagnosis not present

## 2017-04-27 DIAGNOSIS — R292 Abnormal reflex: Secondary | ICD-10-CM

## 2017-04-27 DIAGNOSIS — G4489 Other headache syndrome: Secondary | ICD-10-CM

## 2017-04-27 DIAGNOSIS — M1990 Unspecified osteoarthritis, unspecified site: Secondary | ICD-10-CM

## 2017-04-28 ENCOUNTER — Encounter: Payer: Self-pay | Admitting: Neurology

## 2017-04-28 ENCOUNTER — Ambulatory Visit (INDEPENDENT_AMBULATORY_CARE_PROVIDER_SITE_OTHER): Payer: 59 | Admitting: Neurology

## 2017-04-28 VITALS — BP 123/81 | HR 74 | Resp 18 | Ht 69.0 in | Wt 258.0 lb

## 2017-04-28 DIAGNOSIS — L4050 Arthropathic psoriasis, unspecified: Secondary | ICD-10-CM

## 2017-04-28 DIAGNOSIS — G4730 Sleep apnea, unspecified: Secondary | ICD-10-CM

## 2017-04-28 DIAGNOSIS — R519 Headache, unspecified: Secondary | ICD-10-CM

## 2017-04-28 DIAGNOSIS — G4752 REM sleep behavior disorder: Secondary | ICD-10-CM

## 2017-04-28 DIAGNOSIS — G932 Benign intracranial hypertension: Secondary | ICD-10-CM | POA: Diagnosis not present

## 2017-04-28 DIAGNOSIS — G4733 Obstructive sleep apnea (adult) (pediatric): Secondary | ICD-10-CM

## 2017-04-28 DIAGNOSIS — G4710 Hypersomnia, unspecified: Secondary | ICD-10-CM

## 2017-04-28 DIAGNOSIS — R51 Headache: Secondary | ICD-10-CM | POA: Diagnosis not present

## 2017-04-28 MED ORDER — ARMODAFINIL 250 MG PO TABS: 250.0000 mg | ORAL_TABLET | Freq: Every day | ORAL | 5 refills | Status: DC

## 2017-04-28 MED FILL — ARMODAFINIL 250 MG TABLET: 250 | 30 days supply | Qty: 30 | Fill #0
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
## 2017-04-28 NOTE — Patient Instructions (Signed)
Stop the cyclobenzaprine.  Continue clonazepam and levetiracetam.  Nuvigil 250 mg every morning for the excessive sleepiness. This will be faxed into the [facility] [place].  I am going to refer you to ENT for consultation regarding possible obstructive sleep apnea surgery.

## 2017-05-04 DIAGNOSIS — G4733 Obstructive sleep apnea (adult) (pediatric): Secondary | ICD-10-CM | POA: Diagnosis not present

## 2017-05-04 DIAGNOSIS — R0683 Snoring: Secondary | ICD-10-CM | POA: Diagnosis not present

## 2017-05-07 MED FILL — DULoxetine HCL 60 MG CPEP: 60 | 30 days supply | Qty: 60 | Fill #1

## 2017-05-17 ENCOUNTER — Other Ambulatory Visit: Payer: Self-pay | Admitting: *Deleted

## 2017-05-17 NOTE — Patient Outreach (Signed)
Left message on [name]'s mobile requesting return call in order to close case to diabetes Link to Wellness program and explain transition of disease management services from Link To wellness to [facility] or Active Health Management.  Will close case to diabetes Link To [place] RN,CCM,CDE [place] Management Coordinator Link To Wellness and [employer] [phone] Office Fax [phone]

## 2017-05-21 ENCOUNTER — Other Ambulatory Visit: Payer: Self-pay | Admitting: Internal Medicine

## 2017-05-21 MED FILL — levETIRAcetam 750 MG TABS: 750 | 30 days supply | Qty: 60 | Fill #3

## 2017-05-21 MED FILL — FENOFIBRATE 134 MG CAPSULE: 134 | 30 days supply | Qty: 30 | Fill #2

## 2017-05-21 MED FILL — CYCLOBENZAPRINE 5 MG TABLET: 5 | 30 days supply | Qty: 30 | Fill #4

## 2017-05-24 MED FILL — CARVEDILOL 12.5 MG TABLET: 12.5 | 30 days supply | Qty: 60 | Fill #0

## 2017-05-25 ENCOUNTER — Other Ambulatory Visit: Payer: Self-pay | Admitting: *Deleted

## 2017-05-25 NOTE — Patient Outreach (Addendum)
[name] returned phone call to this [employer] and he was advised that disease self-management services will be transitioned from the Link To Wellness program to either [facility] or Active Health Management in 2019 for all [place] members.  Also advised him that a letter will be mailed to his home address with details of this transition.  Will close case to the diabetes Link To [place] RN,CCM,CDE [place] Management Coordinator Link To Wellness and [employer] [phone] Office Fax [phone]

## 2017-05-27 MED FILL — TRULICITY 1.5 MG/0.5 ML PEN: 1.5 | 28 days supply | Qty: 2 | Fill #2

## 2017-05-27 MED FILL — HUMIRA PEN 40 MG/0.8ML PNKT: 40 | 28 days supply | Qty: 2 | Fill #3

## 2017-06-03 ENCOUNTER — Other Ambulatory Visit: Payer: Self-pay | Admitting: Internal Medicine

## 2017-06-03 DIAGNOSIS — R0683 Snoring: Secondary | ICD-10-CM | POA: Diagnosis not present

## 2017-06-03 DIAGNOSIS — G4730 Sleep apnea, unspecified: Secondary | ICD-10-CM | POA: Diagnosis not present

## 2017-06-03 MED FILL — GABAPENTIN 600 MG TABS: 600 | 30 days supply | Qty: 90 | Fill #0

## 2017-06-03 MED FILL — ATORVASTATIN 10 MG TABLET: 10 | 90 days supply | Qty: 90 | Fill #3

## 2017-06-03 MED FILL — DULoxetine HCL 60 MG CPEP: 60 | 30 days supply | Qty: 60 | Fill #2

## 2017-06-17 MED FILL — CYCLOBENZAPRINE 5 MG TABLET: 5 | 30 days supply | Qty: 30 | Fill #5

## 2017-06-17 MED FILL — HUMIRA PEN 40 MG/0.8ML PNKT: 40 | 28 days supply | Qty: 2 | Fill #4

## 2017-06-17 MED FILL — FENOFIBRATE 134 MG CAPSULE: 134 | 30 days supply | Qty: 30 | Fill #3

## 2017-07-07 ENCOUNTER — Other Ambulatory Visit: Payer: Self-pay | Admitting: Endocrinology

## 2017-07-07 MED FILL — DULoxetine HCL 60 MG CPEP: 60 | 30 days supply | Qty: 60 | Fill #3

## 2017-07-07 MED FILL — CARVEDILOL 12.5 MG TABLET: 12.5 | 30 days supply | Qty: 60 | Fill #1

## 2017-07-07 MED FILL — levETIRAcetam 750 MG TABS: 750 | 30 days supply | Qty: 60 | Fill #4

## 2017-07-07 MED FILL — metFORMIN HCL 1000 MG TABS: 1000 | 90 days supply | Qty: 180 | Fill #2

## 2017-07-07 MED FILL — GABAPENTIN 600 MG TABS: 600 | 30 days supply | Qty: 90 | Fill #1

## 2017-07-15 ENCOUNTER — Other Ambulatory Visit (INDEPENDENT_AMBULATORY_CARE_PROVIDER_SITE_OTHER): Payer: 59

## 2017-07-15 ENCOUNTER — Ambulatory Visit: Payer: 59 | Admitting: *Deleted

## 2017-07-15 DIAGNOSIS — E1165 Type 2 diabetes mellitus with hyperglycemia: Secondary | ICD-10-CM

## 2017-07-15 LAB — LIPID PANEL
CHOL/HDL RATIO: 4
Cholesterol: 164 mg/dL (ref 0–200)
HDL: 39.4 mg/dL (ref >39.00)
NONHDL: 124.5
TRIGLYCERIDES: 212 mg/dL — AB (ref 0.0–149.0)
VLDL: 42.4 mg/dL — ABNORMAL HIGH (ref 0.0–40.0)

## 2017-07-15 LAB — MICROALBUMIN / CREATININE URINE RATIO
CREATININE, U: 182.6 mg/dL
MICROALB UR: 1.9 mg/dL (ref 0.0–1.9)
MICROALB/CREAT RATIO: 1 mg/g (ref 0.0–30.0)

## 2017-07-15 LAB — COMPREHENSIVE METABOLIC PANEL
ALT: 40 U/L (ref 0–53)
AST: 35 U/L (ref 0–37)
Albumin: 4.3 g/dL (ref 3.5–5.2)
Alkaline Phosphatase: 67 U/L (ref 39–117)
BUN: 21 mg/dL (ref 6–23)
CALCIUM: 10.3 mg/dL (ref 8.4–10.5)
CHLORIDE: 101 meq/L (ref 96–112)
CO2: 23 meq/L (ref 19–32)
Creatinine, Ser: 1.44 mg/dL (ref 0.40–1.50)
GFR: 53.3 mL/min — ABNORMAL LOW (ref >60.00)
GLUCOSE: 195 mg/dL — AB (ref 70–99)
Potassium: 4.4 mEq/L (ref 3.5–5.1)
SODIUM: 135 meq/L (ref 135–145)
Total Bilirubin: 1.2 mg/dL (ref 0.2–1.2)
Total Protein: 8.1 g/dL (ref 6.0–8.3)

## 2017-07-15 LAB — LDL CHOLESTEROL, DIRECT: Direct LDL: 101 mg/dL

## 2017-07-15 LAB — HEMOGLOBIN A1C: HEMOGLOBIN A1C: 7.3 % — AB (ref 4.6–6.5)

## 2017-07-17 ENCOUNTER — Emergency Department (HOSPITAL_COMMUNITY)
Admission: EM | Admit: 2017-07-17 | Discharge: 2017-07-17 | Disposition: A | Discharge status: Home / Self Care | Payer: 59 | Attending: Emergency Medicine | Admitting: Emergency Medicine

## 2017-07-17 ENCOUNTER — Other Ambulatory Visit: Payer: Self-pay

## 2017-07-17 ENCOUNTER — Emergency Department (HOSPITAL_COMMUNITY): Payer: 59

## 2017-07-17 ENCOUNTER — Encounter (HOSPITAL_COMMUNITY): Payer: Self-pay

## 2017-07-17 DIAGNOSIS — K8020 Calculus of gallbladder without cholecystitis without obstruction: Secondary | ICD-10-CM | POA: Diagnosis not present

## 2017-07-17 DIAGNOSIS — R109 Unspecified abdominal pain: Secondary | ICD-10-CM | POA: Diagnosis not present

## 2017-07-17 DIAGNOSIS — Z79899 Other long term (current) drug therapy: Secondary | ICD-10-CM | POA: Insufficient documentation

## 2017-07-17 DIAGNOSIS — R1084 Generalized abdominal pain: Secondary | ICD-10-CM | POA: Diagnosis not present

## 2017-07-17 DIAGNOSIS — I10 Essential (primary) hypertension: Secondary | ICD-10-CM | POA: Diagnosis not present

## 2017-07-17 DIAGNOSIS — Z96642 Presence of left artificial hip joint: Secondary | ICD-10-CM | POA: Insufficient documentation

## 2017-07-17 DIAGNOSIS — E1140 Type 2 diabetes mellitus with diabetic neuropathy, unspecified: Secondary | ICD-10-CM | POA: Diagnosis not present

## 2017-07-17 DIAGNOSIS — Z7984 Long term (current) use of oral hypoglycemic drugs: Secondary | ICD-10-CM | POA: Insufficient documentation

## 2017-07-17 HISTORY — DX: Polyneuropathy, unspecified: G62.9

## 2017-07-17 LAB — CBC
HEMATOCRIT: 44.9 % (ref 39.0–52.0)
HEMOGLOBIN: 15.9 g/dL (ref 13.0–17.0)
MCH: 31.7 pg (ref 26.0–34.0)
MCHC: 35.4 g/dL (ref 30.0–36.0)
MCV: 89.6 fL (ref 78.0–100.0)
Platelets: 162 10*3/uL (ref 150–400)
RBC: 5.01 MIL/uL (ref 4.22–5.81)
RDW: 14.8 % (ref 11.5–15.5)
WBC: 8.8 10*3/uL (ref 4.0–10.5)

## 2017-07-17 LAB — COMPREHENSIVE METABOLIC PANEL
ALT: 46 U/L (ref 17–63)
ANION GAP: 7 (ref 5–15)
AST: 40 U/L (ref 15–41)
Albumin: 4.5 g/dL (ref 3.5–5.0)
Alkaline Phosphatase: 77 U/L (ref 38–126)
BUN: 23 mg/dL — AB (ref 6–20)
CHLORIDE: 102 mmol/L (ref 101–111)
CO2: 27 mmol/L (ref 22–32)
Calcium: 10.3 mg/dL (ref 8.9–10.3)
Creatinine, Ser: 1.53 mg/dL — ABNORMAL HIGH (ref 0.61–1.24)
GFR calc Af Amer: 56 mL/min — ABNORMAL LOW (ref >60)
GFR, EST NON AFRICAN AMERICAN: 48 mL/min — AB (ref >60)
Glucose, Bld: 147 mg/dL — ABNORMAL HIGH (ref 65–99)
POTASSIUM: 4.4 mmol/L (ref 3.5–5.1)
Sodium: 136 mmol/L (ref 135–145)
Total Bilirubin: 1.6 mg/dL — ABNORMAL HIGH (ref 0.3–1.2)
Total Protein: 9.2 g/dL — ABNORMAL HIGH (ref 6.5–8.1)

## 2017-07-17 LAB — LIPASE, BLOOD: LIPASE: 80 U/L — AB (ref 11–51)

## 2017-07-17 MED ORDER — IOPAMIDOL (ISOVUE-300) INJECTION 61%
INTRAVENOUS | Status: AC
  Administered 2017-07-17: 75 mL
  Filled 2017-07-17: qty 75

## 2017-07-17 MED ORDER — SUCRALFATE 1 GM/10ML PO SUSP: 1.0000 g | Freq: Three times a day (TID) | ORAL | 0 refills | Status: DC

## 2017-07-17 MED ORDER — RANITIDINE HCL 150 MG PO CAPS: 150.0000 mg | ORAL_CAPSULE | Freq: Every day | ORAL | 0 refills | Status: DC

## 2017-07-17 NOTE — ED Notes (Signed)
Pt has been provided a urinal at this time to obtain a urine sample when ready.

## 2017-07-17 NOTE — ED Provider Notes (Signed)
[facility] DEPT Provider Note   CSN: [id number] Arrival date & time: 07/17/17  0806     History   Chief Complaint Chief Complaint  Patient presents with  . Abdominal Pain  . Headache    HPI James [name] is a 62 y.o. male with h/o GERD, ulcer, diverticulitis presents for evaluation of gradually worsening abdominal pain described as "achy" and "crampy" x 2 weeks. Pain is all over abdomen. Associated symptoms include mild nausea, increased flatus, belching with regurgitation, bloating, and constipation and diarrhea. Aggravating factors include eating, reports onset of symptoms soon after meals. States he will go 2-3 days without BM then will have diarrhea. Reports he has a VP shunt into his stomach and drug induced cirrhosis.   He denies fevers, chill, vomiting, CP, SOB, cough, dysuria, melena, hematochezia.   HPI  Past Medical History:  Diagnosis Date  . Acid reflux   . Cholelithiasis   . Chronic headaches    on cymbalta  . Cirrhosis ([place])   . Colon polyps   . Depression    on cymbalta  . Diabetes mellitus with neuropathy ([place])    borderline per primary MD  . Diverticulitis 03/2013  . Eczema   . Elevated LFTs   . Fatty liver   . Hypertension   . Insomnia 04/26/2013  . Kidney stone   . Morbid obesity ([place])   . Neuropathy   . Post-traumatic hydrocephalus    s/p shunts x 2 (first got infected )  . Psoriasis    sees Dr [name]  . Psoriatic arthritis ([place])   . 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 Stomach ulcer   . Testosterone deficiency 04/28/2011    Patient Active Problem List   Diagnosis Date Noted  . Idiopathic intracranial hypertension 01/14/2017  . REM behavioral disorder 01/14/2017  . Liver cirrhosis secondary to NASH (nonalcoholic steatohepatitis) ([place]) 01/02/2016  . H/O craniotomy 05/07/2015  . OSA on CPAP 05/07/2015  . Annual physical exam 04/08/2015  . PCP NOTES >>> 04/08/2015    . Hypersomnia with sleep apnea 01/28/2015  . Severe obesity (BMI >= 40) ([place]) 01/28/2015  . Obstructive hydrocephalus 01/28/2015  . Chronic fatigue 01/28/2015  . Depression 09/04/2014  . OSA -- dx ~ 2012, cpap intolerant 09/04/2014  . Insomnia 04/26/2013  . Sigmoid diverticulitis 04/25/2013  . Diabetes with neuropathy 04/25/2013  . Cirrhosis ([place]) 04/25/2013  . Presence of cerebrospinal fluid drainage device 07/28/2011  . Psoriatic arthritis ([place]) 04/28/2011  . Headache 04/28/2011  . GERD (gastroesophageal reflux disease) 04/28/2011  . Urinary retention 04/28/2011  . Hypertension 04/28/2011  . Testosterone deficiency 04/28/2011    Past Surgical History:  Procedure Laterality Date  . BACK SURGERY  1980  . SHOULDER SURGERY Left 2010  . TOE SURGERY Left 2018  . TOTAL HIP ARTHROPLASTY Left 2011  . VENTRICULOPERITONEAL SHUNT  2007   x2       Home Medications    Prior to Admission medications   Medication Sig Start Date End Date Taking? Authorizing Provider  Adalimumab (HUMIRA PEN) 40 MG/0.8ML PNKT Inject 0.8 mLs into the skin every 14 (fourteen) days. 02/09/17  Yes Tresa Garter, MD  atorvastatin (LIPITOR) 10 MG tablet Take 1 tablet (10 mg total) by mouth daily. 09/10/16  Yes Elayne Snare, MD  carvedilol (COREG) 12.5 MG tablet Take 1 tablet (12.5 mg total) by mouth 2 (two) times daily with a meal. 05/24/17  Yes Colon Branch, MD  clonazePAM (KLONOPIN) 0.5 MG tablet Take 1 tablet (0.5 mg total) by mouth at bedtime. Fax to [facility]  [phone] 01/14/17  Yes Sater, Nanine Means, MD  cyclobenzaprine (FLEXERIL) 5 MG tablet Take 1 tablet (5 mg total) by mouth at bedtime. 01/14/17  Yes Sater, Nanine Means, MD  DULoxetine (CYMBALTA) 60 MG capsule Take 2 capsules (120 mg total) by mouth daily. 04/08/17  Yes Paz, Alda Berthold, MD  fenofibrate micronized (LOFIBRA) 134 MG capsule TAKE 1 CAPSULE (134 MG TOTAL) BY MOUTH DAILY BEFORE BREAKFAST. 07/07/17  Yes Elayne Snare, MD  gabapentin (NEURONTIN) 600  MG tablet Take 1 tablet (600 mg total) by mouth 3 (three) times daily. 06/03/17  Yes Colon Branch, MD  levETIRAcetam (KEPPRA) 750 MG tablet Take 1 tablet (750 mg total) by mouth 2 (two) times daily. 01/14/17  Yes Sater, Nanine Means, MD  metFORMIN (GLUCOPHAGE) 1000 MG tablet TAKE 1 TABLET BY MOUTH TWICE DAILY WITH A MEAL 03/05/17  Yes Elayne Snare, MD  naproxen sodium (ALEVE) 220 MG tablet Take 440 mg by mouth daily as needed (PAIN).   Yes [provider]  omeprazole (PRILOSEC) 40 MG capsule Take 1 capsule (40 mg total) by mouth daily. 09/23/16  Yes Paz, Alda Berthold, MD  TRULICITY 1.5 AY/0.4HT [facility] INJECT THE CONTENTS OF ONE PEN ONCE PER WEEK 02/05/17  Yes Elayne Snare, MD  Armodafinil (NUVIGIL) 250 MG tablet Take 1 tablet (250 mg total) by mouth daily. Patient not taking: Reported on 07/17/2017 04/28/17   Britt Bottom, MD  Blood Glucose Monitoring Suppl (FREESTYLE FREEDOM LITE) w/Device KIT Use to check blood sugars 3 times daily. Dx code: E11.9 10/29/16   Elayne Snare, MD  glucose blood (FREESTYLE LITE) test strip Use to check blood sugar 3 times daily. Dx code E11.9 10/29/16   Elayne Snare, MD  Lancets (FREESTYLE) lancets Use to check blood sugar 3 times daily. Dx code E11.9 10/29/16   Elayne Snare, MD  ranitidine (ZANTAC) 150 MG capsule Take 1 capsule (150 mg total) by mouth daily. 07/17/17   Kinnie Feil, PA-C  sucralfate (CARAFATE) 1 GM/10ML suspension Take 10 mLs (1 g total) by mouth 4 (four) times daily -  with meals and at bedtime. 07/17/17   Kinnie Feil, PA-C    Family History Family History  Problem Relation Age of Onset  . Healthy Mother   . Lung cancer Father        alive, former smoker   . Heart disease Brother        MI age 33  . Other Brother        Murdered  . Down syndrome Son   . Diabetes Neg Hx   . Prostate cancer Neg Hx   . Colon cancer Neg Hx     Social History Social History   Tobacco Use  . Smoking status: Never Smoker  . Smokeless tobacco: Never Used    Substance Use Topics  . Alcohol use: No  . Drug use: Yes    Types: Psilocybin     Allergies   Hydrocodone-homatropine; Morphine and related; and Sulfa drugs cross reactors   Review of Systems Review of Systems  Gastrointestinal: Positive for abdominal distention, abdominal pain, constipation, diarrhea and nausea.  All other systems reviewed and are negative.    Physical Exam Updated Vital Signs BP 132/86 (BP Location: Right Arm)   Pulse 78   Temp 98.6 F (37 C) (Oral)   Resp 18   Ht _0  (1.803 m)  Wt 115.7 kg (255 lb)   SpO2 95%   BMI 35.57 kg/m   Physical Exam  Constitutional: He is oriented to person, place, and time. He appears well-developed and well-nourished. No distress.  Non toxic.  HENT:  Head: Normocephalic and atraumatic.  Nose: Nose normal.  Mouth/Throat: No oropharyngeal exudate.  Moist mucous membranes   Eyes: Conjunctivae and EOM are normal. Pupils are equal, round, and reactive to light.  Neck: Normal range of motion.  Cardiovascular: Normal rate, regular rhythm, normal heart sounds and intact distal pulses.  No murmur heard. 2+ DP and radial pulses bilaterally. No LE edema.   Pulmonary/Chest: Effort normal and breath sounds normal.  Abdominal: Soft. Bowel sounds are normal. There is tenderness in the epigastric area, suprapubic area, left upper quadrant and left lower quadrant.  TTP to epigastrium and diffuse lower abdomen. No G/R/R. No suprapubic or CVA tenderness.   Musculoskeletal: Normal range of motion. He exhibits no deformity.  Neurological: He is alert and oriented to person, place, and time.  Skin: Skin is warm and dry. Capillary refill takes less than 2 seconds.  Psychiatric: He has a normal mood and affect. His behavior is normal. Judgment and thought content normal.  Nursing note and vitals reviewed.    ED Treatments / Results  Labs (all labs ordered are listed, but only abnormal results are displayed) Labs Reviewed   LIPASE, BLOOD - Abnormal; Notable for the following components:      Result Value   Lipase 80 (*)    All other components within normal limits  COMPREHENSIVE METABOLIC PANEL - Abnormal; Notable for the following components:   Glucose, Bld 147 (*)    BUN 23 (*)    Creatinine, Ser 1.53 (*)    Total Protein 9.2 (*)    Total Bilirubin 1.6 (*)    GFR calc non Af Amer 48 (*)    GFR calc Af Amer 56 (*)    All other components within normal limits  CBC    EKG  EKG Interpretation None       Radiology Ct Abdomen Pelvis W Contrast  Result Date: 07/17/2017 CLINICAL DATA:  Abdominal pain for several weeks EXAM: CT ABDOMEN AND PELVIS WITH CONTRAST TECHNIQUE: Multidetector CT imaging of the abdomen and pelvis was performed using the standard protocol following bolus administration of intravenous contrast. CONTRAST:  80m ISOVUE-300 IOPAMIDOL (ISOVUE-300) INJECTION 61% COMPARISON:  CT 09/11/2016 FINDINGS: Lower chest: Lung bases are clear. Hepatobiliary: Liver has a nodular contour. Caudate lobe is enlarged. Peritoneal catheter terminates along the anterior margin liver. No collection. No enhancing hepatic lesion. Gallstones noted. Common bile duct Pancreas: Pancreas is normal. No ductal dilatation. No pancreatic inflammation. Spleen: Normal spleen Adrenals/urinary tract: Adrenal glands and kidneys are normal. The ureters and bladder normal. Stomach/Bowel: Stomach, small bowel, appendix, and cecum are normal. Several diverticular the descending colon sigmoid colon Vascular/Lymphatic: Abdominal aorta is normal caliber. There is no retroperitoneal or periportal lymphadenopathy. No pelvic lymphadenopathy. Reproductive: Prostate normal Other: No free fluid. Musculoskeletal: LEFT hip prosthetic.  No aggressive osseous lesion IMPRESSION: 1. No acute abdominopelvic findings. 2. Nodule liver with enlarged caudate lobe suggests early cirrhosis. No ascites 3. Cholelithiasis without cholecystitis 4. Diverticulosis  without evidence of diverticulitis. Electronically Signed   By: SSuzy BouchardM.D.   On: 07/17/2017 14:15    Procedures Procedures (including critical care time)  Medications Ordered in ED Medications  iopamidol (ISOVUE-300) 61 % injection (75 mLs  Contrast Given 07/17/17 1327)     Initial  Impression / Assessment and Plan / ED Course  I have reviewed the triage vital signs and the nursing notes.  Pertinent labs & imaging results that were available during my care of the patient were reviewed by me and considered in my medical decision making (see chart for details).  Clinical Course as of Jul 17 2000  Sat Jul 17, 2017  1302 Lipase: (!) 80 [CG]  1302 Creatinine: (!) 1.53 [CG]  1302 GFR, Est Non African American: (!) 48 [CG]  1959 IMPRESSION: 1. No acute abdominopelvic findings. 2. Nodule liver with enlarged caudate lobe suggests early cirrhosis. No ascites 3. Cholelithiasis without cholecystitis 4. Diverticulosis without evidence of diverticulitis. CT Abdomen Pelvis W Contrast [CG]    Clinical Course User Index [CG] Kinnie Feil, PA-C   63 yo male here for abdominal pain x 2 weeks. Worse after meals and associated with increased gas, bloating, regurgitation, diarrhea, constipation.   Exam remarkable for diffuse abdominal tenderness Negative Murphy's and McBurney's. Considering poorly controlled GERD, PUD, gastritis, pancreatitis, diverticulitis. Doubt UTI as he has no symptoms.   Final Clinical Impressions(s) / ED Diagnoses   ED work up remarkable for lipase 80. Creatinine 1.53.  CT AP w/o acute findings. Cholelithiasis noted which could be culprit, he does not have significant TTP at Murphy's on repeat exam. Discussed work up with pt and wife. He is tolerating PO challenge and has not needed analgesia or antiemetics. Will d/c with carafate, zantace, omeprazole and GI f/u. He has appt with Dr Henrene Pastor in 3 days. Discussed return precautions.  Final diagnoses:  Calculus of  gallbladder without cholecystitis without obstruction  Generalized abdominal pain    ED Discharge Orders        Ordered    sucralfate (CARAFATE) 1 GM/10ML suspension  3 times daily with meals & bedtime     07/17/17 1539    ranitidine (ZANTAC) 150 MG capsule  Daily     07/17/17 1539       Arlean Hopping 07/17/17 Johnnette Barrios, MD 07/19/17 2112

## 2017-07-17 NOTE — ED Notes (Signed)
2 failed attempts to collect labs.

## 2017-07-17 NOTE — Discharge Instructions (Signed)
CT scan showed stones in your gallbladder. This may be contributing to your symptoms.   Use carafate suspension 20-30 min before meals. Zantac twice daily. Omeprazole as prescribed. Avoid heavy, fatty, spicy, acidic foods. Avoid ibuprofen and alcohol.   Follow up with Dr Henrene Pastor as scheduled. Return to ED if you develop local pain to right upper abdomen, vomiting, fevers, bloody diarrhea.

## 2017-07-17 NOTE — ED Triage Notes (Signed)
Pt c/o generalized abdominal pain x 2 weeks, constipation for "a while" until diarrhea starting this morning, and headache and nausea starting this morning.  Pain score 4/10.  Pt has not taken anything for symptoms.  Pt reports excessive gas/abdominal distention.  Also, pt reports having a shunt in his brain.

## 2017-07-18 NOTE — Progress Notes (Signed)
Patient ID: James [name], male   DOB: 04-Feb-1956, 62 y.o.   MRN: [id number]           Reason for Appointment: Follow-up for Type 2 Diabetes  Referring physician: [name]  History of Present Illness:          Date of diagnosis of type 2 diabetes mellitus:  ?  2014      Background history:  He is not clear how his diabetes was diagnosed, likely on routine testing. Initially had been treated with metformin and also tried on Amaryl Patient had progressive increase in his blood sugars since 1/17 with stopping metformin and being on the regimen of Amaryl and Januvia, he thinks his blood sugars went up to 601.  He was then started on basal bolus insulin   Recent history:   Non-insulin hypoglycemic drugs the patient is taking are: Trulicity 1.5 mg weekly, Metformin 1 g twice a day  His A1c is generally under 7% and now is 7.3 compared to 6.2  Current management, blood sugar patterns and problems identified:  He has had higher blood sugars overall recently although still checking mostly fasting readings  Also he cannot explain why his blood sugar was 217 yesterday afternoon before suppertime  Also not clear if he is having high postprandial readings, lowest reading 135 before lunch, lab glucose 147  For various reasons he has not done any exercise and not losing weight  He was told not to start metformin 2 days ago because of having a CAT scan with contrast  He still is drinking juice and other drinks with sugar    Side  effects from medications have been: None  Compliance with the medical regimen: Fair  Glucose monitoring:  done <1 times a day         Glucometer:  Contour Blood Glucose readings by download  FASTING range 148-185 with AVERAGE 166 Nonfasting 135-217 with only 2 readings  Self-care: The diet that the patient has been following is:   reducing fried food   Meal times are:  Breakfast is periodically skipped.  Lunch: 2-3 PM Dinner: 5-6 PM          Dietician visit,  most recent:09/2015               Exercise: none recently  Weight history:  Wt Readings from Last 3 Encounters:  07/19/17 257 lb (116.6 kg)  07/17/17 255 lb (115.7 kg)  04/28/17 258 lb (117 kg)    Glycemic control:   Lab Results  Component Value Date   HGBA1C 7.3 (H) 07/15/2017   HGBA1C 6.2 03/17/2017   HGBA1C 6.5 12/07/2016   Lab Results  Component Value Date   MICROALBUR 1.9 07/15/2017   LDLCALC 137 (H) 09/07/2016   CREATININE 1.53 (H) 07/17/2017    Admission on 07/17/2017, Discharged on 07/17/2017  Component Date Value Ref Range Status  . Lipase 07/17/2017 80* 11 - 51 U/L Final  . Sodium 07/17/2017 136  135 - 145 mmol/L Final  . Potassium 07/17/2017 4.4  3.5 - 5.1 mmol/L Final  . Chloride 07/17/2017 102  101 - 111 mmol/L Final  . CO2 07/17/2017 27  22 - 32 mmol/L Final  . Glucose, Bld 07/17/2017 147* 65 - 99 mg/dL Final  . BUN 07/17/2017 23* 6 - 20 mg/dL Final  . Creatinine, Ser 07/17/2017 1.53* 0.61 - 1.24 mg/dL Final  . Calcium 07/17/2017 10.3  8.9 - 10.3 mg/dL Final  . Total Protein 07/17/2017 9.2* 6.5 - 8.1  g/dL Final  . Albumin 07/17/2017 4.5  3.5 - 5.0 g/dL Final  . AST 07/17/2017 40  15 - 41 U/L Final  . ALT 07/17/2017 46  17 - 63 U/L Final  . Alkaline Phosphatase 07/17/2017 77  38 - 126 U/L Final  . Total Bilirubin 07/17/2017 1.6* 0.3 - 1.2 mg/dL Final  . GFR calc non Af Amer 07/17/2017 48* >60 mL/min Final  . GFR calc Af Amer 07/17/2017 56* >60 mL/min Final   Comment: (NOTE) The eGFR has been calculated using the CKD EPI equation. This calculation has not been validated in all clinical situations. eGFR's persistently <60 mL/min signify possible Chronic Kidney Disease.   . Anion gap 07/17/2017 7  5 - 15 Final  . WBC 07/17/2017 8.8  4.0 - 10.5 K/uL Final  . RBC 07/17/2017 5.01  4.22 - 5.81 MIL/uL Final  . Hemoglobin 07/17/2017 15.9  13.0 - 17.0 g/dL Final  . HCT 07/17/2017 44.9  39.0 - 52.0 % Final  . MCV 07/17/2017 89.6  78.0 - 100.0 fL Final  . MCH  07/17/2017 31.7  26.0 - 34.0 pg Final  . MCHC 07/17/2017 35.4  30.0 - 36.0 g/dL Final  . RDW 07/17/2017 14.8  11.5 - 15.5 % Final  . Platelets 07/17/2017 162  150 - 400 K/uL Final  Lab on 07/15/2017  Component Date Value Ref Range Status  . Microalb, Ur 07/15/2017 1.9  0.0 - 1.9 mg/dL Final  . Creatinine,U 07/15/2017 182.6  mg/dL Final  . Microalb Creat Ratio 07/15/2017 1.0  0.0 - 30.0 mg/g Final  . Sodium 07/15/2017 135  135 - 145 mEq/L Final  . Potassium 07/15/2017 4.4  3.5 - 5.1 mEq/L Final  . Chloride 07/15/2017 101  96 - 112 mEq/L Final  . CO2 07/15/2017 23  19 - 32 mEq/L Final  . Glucose, Bld 07/15/2017 195* 70 - 99 mg/dL Final  . BUN 07/15/2017 21  6 - 23 mg/dL Final  . Creatinine, Ser 07/15/2017 1.44  0.40 - 1.50 mg/dL Final  . Total Bilirubin 07/15/2017 1.2  0.2 - 1.2 mg/dL Final  . Alkaline Phosphatase 07/15/2017 67  39 - 117 U/L Final  . AST 07/15/2017 35  0 - 37 U/L Final  . ALT 07/15/2017 40  0 - 53 U/L Final  . Total Protein 07/15/2017 8.1  6.0 - 8.3 g/dL Final  . Albumin 07/15/2017 4.3  3.5 - 5.2 g/dL Final  . Calcium 07/15/2017 10.3  8.4 - 10.5 mg/dL Final  . GFR 07/15/2017 53.30* >60.00 mL/min Final  . Cholesterol 07/15/2017 164  0 - 200 mg/dL Final   ATP III Classification       Desirable:  < 200 mg/dL               Borderline High:  200 - 239 mg/dL          High:  > = 240 mg/dL  . Triglycerides 07/15/2017 212.0* 0.0 - 149.0 mg/dL Final   Normal:  <150 mg/dLBorderline High:  150 - 199 mg/dL  . HDL 07/15/2017 39.40  >39.00 mg/dL Final  . VLDL 07/15/2017 42.4* 0.0 - 40.0 mg/dL Final  . Total CHOL/HDL Ratio 07/15/2017 4   Final                  Men          Women1/2 Average Risk     3.4          3.3Average Risk  5.0          4.42X Average Risk          9.6          7.13X Average Risk          15.0          11.0                      . NonHDL 07/15/2017 124.50   Final   NOTE:  Non-HDL goal should be 30 mg/dL higher than patient's LDL goal (i.e. LDL goal of < 70  mg/dL, would have non-HDL goal of < 100 mg/dL)  . Hgb A1c MFr Bld 07/15/2017 7.3* 4.6 - 6.5 % Final   Glycemic Control Guidelines for People with Diabetes:Non Diabetic:  <6%Goal of Therapy: <7%Additional Action Suggested:  >8%   . Direct LDL 07/15/2017 101.0  mg/dL Final   Optimal:  <100 mg/dLNear or Above Optimal:  100-129 mg/dLBorderline High:  130-159 mg/dLHigh:  160-189 mg/dLVery High:  >190 mg/dL       Allergies as of 07/19/2017      Reactions   Hydrocodone-homatropine Other (See Comments)   Depressed feeling   Morphine And Related Other (See Comments)   Hallucinations, back in the 80s. States has taken vicodin before w/o problems    Sulfa Drugs Cross Reactors Rash      Medication List        Accurate as of 07/19/17  3:06 PM. Always use your most recent med list.          Adalimumab 40 MG/0.8ML Pnkt Commonly known as:  HUMIRA PEN Inject 0.8 mLs into the skin every 14 (fourteen) days.   Armodafinil 250 MG tablet Commonly known as:  NUVIGIL Take 1 tablet (250 mg total) by mouth daily.   atorvastatin 10 MG tablet Commonly known as:  LIPITOR Take 1 tablet (10 mg total) by mouth daily.   carvedilol 12.5 MG tablet Commonly known as:  COREG Take 1 tablet (12.5 mg total) by mouth 2 (two) times daily with a meal.   clonazePAM 0.5 MG tablet Commonly known as:  KLONOPIN Take 1 tablet (0.5 mg total) by mouth at bedtime. Fax to [facility]  [phone]   cyclobenzaprine 5 MG tablet Commonly known as:  FLEXERIL Take 1 tablet (5 mg total) by mouth at bedtime.   DULoxetine 60 MG capsule Commonly known as:  CYMBALTA Take 2 capsules (120 mg total) by mouth daily.   fenofibrate micronized 134 MG capsule Commonly known as:  LOFIBRA TAKE 1 CAPSULE (134 MG TOTAL) BY MOUTH DAILY BEFORE BREAKFAST.   FREESTYLE FREEDOM LITE w/Device Kit Use to check blood sugars 3 times daily. Dx code: E11.9   freestyle lancets Use to check blood sugar 3 times daily. Dx code E11.9     gabapentin 600 MG tablet Commonly known as:  NEURONTIN Take 1 tablet (600 mg total) by mouth 3 (three) times daily.   glimepiride 1 MG tablet Commonly known as:  AMARYL Take 1 tablet (1 mg total) by mouth daily before supper.   glucose blood test strip Commonly known as:  ACCU-CHEK GUIDE Use to test blood sugar three times daily   levETIRAcetam 750 MG tablet Commonly known as:  KEPPRA Take 1 tablet (750 mg total) by mouth 2 (two) times daily.   metFORMIN 1000 MG tablet Commonly known as:  GLUCOPHAGE TAKE 1 TABLET BY MOUTH TWICE DAILY WITH A MEAL   naproxen sodium 220 MG tablet Commonly  known as:  ALEVE Take 440 mg by mouth daily as needed (PAIN).   omeprazole 40 MG capsule Commonly known as:  PRILOSEC Take 1 capsule (40 mg total) by mouth daily.   ranitidine 150 MG capsule Commonly known as:  ZANTAC Take 1 capsule (150 mg total) by mouth daily.   sucralfate 1 GM/10ML suspension Commonly known as:  CARAFATE Take 10 mLs (1 g total) by mouth 4 (four) times daily -  with meals and at bedtime.   TRULICITY 1.5 OZ/3.0QM Sopn Generic drug:  Dulaglutide INJECT THE CONTENTS OF ONE PEN ONCE PER WEEK       Allergies:  Allergies  Allergen Reactions  . Hydrocodone-Homatropine Other (See Comments)    Depressed feeling  . Morphine And Related Other (See Comments)    Hallucinations, back in the 80s. States has taken vicodin before w/o problems   . Sulfa Drugs Cross Reactors Rash    Past Medical History:  Diagnosis Date  . Acid reflux   . Cholelithiasis   . Chronic headaches    on cymbalta  . Cirrhosis ([place])   . Colon polyps   . Depression    on cymbalta  . Diabetes mellitus with neuropathy ([place])    borderline per primary MD  . Diverticulitis 03/2013  . Eczema   . Elevated LFTs   . Fatty liver   . Hypertension   . Insomnia 04/26/2013  . Kidney stone   . Morbid obesity ([place])   . Neuropathy   . Post-traumatic hydrocephalus    s/p shunts x 2 (first got infected  )  . Psoriasis    sees Dr [name]  . Psoriatic arthritis ([place])   . 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 Stomach ulcer   . Testosterone deficiency 04/28/2011    Past Surgical History:  Procedure Laterality Date  . BACK SURGERY  1980  . SHOULDER SURGERY Left 2010  . TOE SURGERY Left 2018  . TOTAL HIP ARTHROPLASTY Left 2011  . VENTRICULOPERITONEAL SHUNT  2007   x2    Family History  Problem Relation Age of Onset  . Healthy Mother   . Lung cancer Father        alive, former smoker   . Heart disease Brother        MI age 9  . Other Brother        Murdered  . Down syndrome Son   . Diabetes Neg Hx   . Prostate cancer Neg Hx   . Colon cancer Neg Hx     Social History:  reports that  has never smoked. he has never used smokeless tobacco. He reports that he uses drugs. Drug: Psilocybin. He reports that he does not drink alcohol.    Review of Systems    HYPERTENSION: Blood pressure  has been somewhat variable, recently high Followed by PCP   BP Readings from Last 3 Encounters:  07/19/17 132/90  07/17/17 132/86  04/28/17 123/81    Lipid history: He is on Lipitor  with fair control of LDL and is followed by PCP Is now on fenofibrate for triglyceride treatment, has increased triglycerides again     Lab Results  Component Value Date   CHOL 164 07/15/2017   HDL 39.40 07/15/2017   LDLCALC 137 (H) 09/07/2016   LDLDIRECT 101.0 07/15/2017   TRIG 212.0 (H) 07/15/2017   CHOLHDL 4 07/15/2017            Most recent eye exam  was  2017  Most recent foot exam: 12/2015  He has symptomatic neuropathy and is taking gabapentin twice a day and  Cymbalta  Review of Systems    Physical Examination:  BP 132/90   Pulse 97   Ht _0  (1.753 m)   Wt 257 lb (116.6 kg)   SpO2 97%   BMI 37.95 kg/m       ASSESSMENT:  Diabetes type 2, uncontrolled with BMI 38 See history of present illness for detailed discussion of current  diabetes management, blood sugar patterns and problems identified  His blood sugars are not looking as well controlled with A1c 7.3 and higher than before He is monitoring readings sporadically and they appear to be mostly high even fasting and has minimal readings after meals He can do better with diet and exercise and lose weight  Although he may benefit from [place] his renal function is recently worse  HYPERLIPIDEMIA: Triglycerides are slightly better with adding fenofibrate  HYPERTENSION: He needs to follow-up with PCP  PLAN:    Consultation with dietitian  Stop all juices and drinks with sugar  Trial of Amaryl 1 mg at dinnertime since fasting readings are rarely consistently high More consistent glucose monitoring after dinner Start walking when able to Check creatinine today and if back to normal can go back to 2 g metformin otherwise start only 1 g at dinnertime   Patient Instructions  Start Glimeperide at supper  Check blood sugars on waking up  2-3/7  Also check blood sugars about 2 hours after a meal and do this after different meals by rotation  Recommended blood sugar levels on waking up is 90-130 and about 2 hours after meal is 130-160  Please bring your blood sugar monitor to each visit, thank you   Stop grape juices, all drinks sugar free  Metformin 1 daily at supper  Avoid Alleve  See Dr [name]    Elayne Snare 07/19/2017, 3:06 PM   Note: This office note was prepared with Dragon voice recognition system technology. Any transcriptional errors that result from this process are unintentional.

## 2017-07-19 ENCOUNTER — Ambulatory Visit (INDEPENDENT_AMBULATORY_CARE_PROVIDER_SITE_OTHER): Payer: 59 | Admitting: Endocrinology

## 2017-07-19 ENCOUNTER — Encounter: Payer: Self-pay | Admitting: Endocrinology

## 2017-07-19 ENCOUNTER — Other Ambulatory Visit: Payer: Self-pay

## 2017-07-19 VITALS — BP 132/90 | HR 97 | Ht 69.0 in | Wt 257.0 lb

## 2017-07-19 DIAGNOSIS — E1165 Type 2 diabetes mellitus with hyperglycemia: Secondary | ICD-10-CM

## 2017-07-19 LAB — CREATININE, SERUM: CREATININE: 1.45 mg/dL (ref 0.40–1.50)

## 2017-07-19 MED ORDER — GLUCOSE BLOOD VI STRP: ORAL_STRIP | 5 refills | Status: DC

## 2017-07-19 MED ORDER — GLIMEPIRIDE 1 MG PO TABS: 1.0000 mg | ORAL_TABLET | Freq: Every day | ORAL | 1 refills | Status: DC

## 2017-07-19 NOTE — Patient Instructions (Addendum)
Start Glimeperide at supper  Check blood sugars on waking up  2-3/7  Also check blood sugars about 2 hours after a meal and do this after different meals by rotation  Recommended blood sugar levels on waking up is 90-130 and about 2 hours after meal is 130-160  Please bring your blood sugar monitor to each visit, thank you   Stop grape juices, all drinks sugar free  Metformin 1 daily at supper  Avoid Alleve  See Dr [name]

## 2017-07-20 ENCOUNTER — Encounter: Payer: Self-pay | Admitting: Endocrinology

## 2017-07-20 ENCOUNTER — Encounter: Payer: Self-pay | Admitting: Gastroenterology

## 2017-07-20 ENCOUNTER — Ambulatory Visit (INDEPENDENT_AMBULATORY_CARE_PROVIDER_SITE_OTHER): Payer: 59 | Admitting: Gastroenterology

## 2017-07-20 VITALS — BP 122/78 | HR 93 | Ht 71.0 in | Wt 259.0 lb

## 2017-07-20 DIAGNOSIS — R1011 Right upper quadrant pain: Secondary | ICD-10-CM

## 2017-07-20 DIAGNOSIS — K8020 Calculus of gallbladder without cholecystitis without obstruction: Secondary | ICD-10-CM | POA: Diagnosis not present

## 2017-07-20 DIAGNOSIS — K219 Gastro-esophageal reflux disease without esophagitis: Secondary | ICD-10-CM | POA: Diagnosis not present

## 2017-07-20 MED ORDER — OMEPRAZOLE 40 MG PO CPDR: 40.0000 mg | DELAYED_RELEASE_CAPSULE | Freq: Two times a day (BID) | ORAL | 1 refills | Status: DC

## 2017-07-20 MED ORDER — RANITIDINE HCL 150 MG PO TABS: 150.0000 mg | ORAL_TABLET | Freq: Every day | ORAL | 1 refills | Status: DC

## 2017-07-20 MED FILL — HUMIRA PEN 40 MG/0.8ML PNKT: 40 | 28 days supply | Qty: 2 | Fill #5

## 2017-07-20 MED FILL — GLIMEPIRIDE 1 MG TABLET: 1 | 30 days supply | Qty: 30 | Fill #0

## 2017-07-20 MED FILL — raNITIdine HCL 150 MG TABS: 150 | 90 days supply | Qty: 90 | Fill #0

## 2017-07-20 MED FILL — OMEPRAZOLE DR 40 MG CAPSULE: 40 | 90 days supply | Qty: 180 | Fill #0

## 2017-07-20 MED FILL — ACCU-CHEK GUIDE TEST STRIP: 90 days supply | Qty: 300 | Fill #0

## 2017-07-20 NOTE — Progress Notes (Signed)
07/20/2017 James [name] [id number] December 25, 1955   HISTORY OF PRESENT ILLNESS:  This is a 62 year old male who is known to Dr. Henrene Pastor.  He has been seen here twice earlier this year with complaints of abdominal pain.  Again is here today with complaints of abdominal pain.  Reports right upper quadrant abdominal pain, but also right lower quadrant abdominal pain and then says sometimes he has pain on the left side as well.  Also reports some issues with bad reflux at nighttime recently.  Denies nausea, vomiting, changes in bowel habits.  Currently he is taking his omeprazole 40 mg daily, but takes the medication right before bed.  He was seen in the ER 3 days ago where he was given prescriptions for Zantac and Carafate, but has not yet filled those medications.  He said that he wanted to be seen here first.  CT scan of the abdomen and pelvis with contrast showed only gallstones.  Lipase was slightly elevated at 80 but pancreas was normal on CT scan.  CBC was normal.  CMP showed a slight elevation in total bili at 1.6 but other LFT's normal.  Had a slight bump in Cr as well.  Last EGD was in May 2017 at which time the study was normal.  Colonoscopy is up-to-date with last being in February 2015 at which time he had one polyp removed that actually showed benign vascular proliferation.  Also had diverticulosis.  He is quite set on the fact that he has gallstones and if they may be causing his pain then he wants it taken out because "something needs to be done".  Just of note, he had a bruise in his right epigastrium that he said was from bending over to reach inside his fish aquarium.   Past Medical History:  Diagnosis Date  . Acid reflux   . Cholelithiasis   . Chronic headaches    on cymbalta  . Cirrhosis ([place])   . Colon polyps   . Depression    on cymbalta  . Diabetes mellitus with neuropathy ([place])    borderline per primary MD  . Diverticulitis 03/2013  . Eczema   . Elevated LFTs   . Fatty  liver   . Hypertension   . Insomnia 04/26/2013  . Kidney stone   . Morbid obesity ([place])   . Neuropathy   . Post-traumatic hydrocephalus    s/p shunts x 2 (first got infected )  . Psoriasis    sees Dr [name]  . Psoriatic arthritis ([place])   . 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 Stomach ulcer   . Testosterone deficiency 04/28/2011   Past Surgical History:  Procedure Laterality Date  . BACK SURGERY  1980  . SHOULDER SURGERY Left 2010  . TOE SURGERY Left 2018  . TOTAL HIP ARTHROPLASTY Left 2011  . VENTRICULOPERITONEAL SHUNT  2007   x2    reports that  has never smoked. he has never used smokeless tobacco. He reports that he uses drugs. Drug: Psilocybin. He reports that he does not drink alcohol. family history includes Down syndrome in his son; Healthy in his mother; Heart disease in his brother; Lung cancer in his father; Other in his brother. Allergies  Allergen Reactions  . Hydrocodone-Homatropine Other (See Comments)    Depressed feeling  . Morphine And Related Other (See Comments)    Hallucinations, back in the 80s. States has taken vicodin before w/o problems   .  Sulfa Drugs Cross Reactors Rash      Outpatient Encounter Medications as of 07/20/2017  Medication Sig  . Adalimumab (HUMIRA PEN) 40 MG/0.8ML PNKT Inject 0.8 mLs into the skin every 14 (fourteen) days.  . Armodafinil (NUVIGIL) 250 MG tablet Take 1 tablet (250 mg total) by mouth daily.  Marland Kitchen atorvastatin (LIPITOR) 10 MG tablet Take 1 tablet (10 mg total) by mouth daily.  . Blood Glucose Monitoring Suppl (FREESTYLE FREEDOM LITE) w/Device KIT Use to check blood sugars 3 times daily. Dx code: E11.9  . carvedilol (COREG) 12.5 MG tablet Take 1 tablet (12.5 mg total) by mouth 2 (two) times daily with a meal.  . clonazePAM (KLONOPIN) 0.5 MG tablet Take 1 tablet (0.5 mg total) by mouth at bedtime. Fax to [facility]  [phone]  . cyclobenzaprine (FLEXERIL) 5 MG tablet Take 1 tablet (5  mg total) by mouth at bedtime.  . DULoxetine (CYMBALTA) 60 MG capsule Take 2 capsules (120 mg total) by mouth daily.  . fenofibrate micronized (LOFIBRA) 134 MG capsule TAKE 1 CAPSULE (134 MG TOTAL) BY MOUTH DAILY BEFORE BREAKFAST.  Marland Kitchen gabapentin (NEURONTIN) 600 MG tablet Take 1 tablet (600 mg total) by mouth 3 (three) times daily.  Marland Kitchen glimepiride (AMARYL) 1 MG tablet Take 1 tablet (1 mg total) by mouth daily before supper.  Marland Kitchen glucose blood (ACCU-CHEK GUIDE) test strip Use to test blood sugar three times daily  . Lancets (FREESTYLE) lancets Use to check blood sugar 3 times daily. Dx code E11.9  . levETIRAcetam (KEPPRA) 750 MG tablet Take 1 tablet (750 mg total) by mouth 2 (two) times daily.  . metFORMIN (GLUCOPHAGE) 1000 MG tablet TAKE 1 TABLET BY MOUTH TWICE DAILY WITH A MEAL  . naproxen sodium (ALEVE) 220 MG tablet Take 440 mg by mouth daily as needed (PAIN).  Marland Kitchen omeprazole (PRILOSEC) 40 MG capsule Take 1 capsule (40 mg total) by mouth daily.  . ranitidine (ZANTAC) 150 MG capsule Take 1 capsule (150 mg total) by mouth daily.  . sucralfate (CARAFATE) 1 GM/10ML suspension Take 10 mLs (1 g total) by mouth 4 (four) times daily -  with meals and at bedtime.  . TRULICITY 1.5 GD/9.2EQ SOPN INJECT THE CONTENTS OF ONE PEN ONCE PER WEEK   No facility-administered encounter medications on file as of 07/20/2017.      REVIEW OF SYSTEMS  : All other systems reviewed and negative except where noted in the History of Present Illness.   PHYSICAL EXAM: BP 122/78   Pulse 93   Ht _0  (1.803 m)   Wt 259 lb (117.5 kg)   BMI 36.12 kg/m  General: Well developed white male in no acute distress Head: Normocephalic and atraumatic Eyes:  Sclerae anicteric, conjunctiva pink. Ears: Normal auditory acuity Lungs: Clear throughout to auscultation; no increased WOB. Heart: Regular rate and rhythm; no M/R/G. Abdomen: Soft, non-distended.  BS present.  Mild RUQ and epigastric TTP. Musculoskeletal: Symmetrical with  no gross deformities  Skin: No lesions on visible extremities Extremities: No edema  Neurological: Alert oriented x 4, grossly non-focal Psychological:  Alert and cooperative. Normal mood and affect  ASSESSMENT AND PLAN: *62 year old male with complaints of right sided abdominal pain and GERD.  He thinks that his gallstones are causing his symptoms.  Does have some RUQ/epigastric tenderness, which could be gallstone related, but could also be GERD related.  Also reports RLQ abdominal pain that is likely too low to be gallbladder related.  I am unsure what is causing his  pain.  Recent CT scan and labs otherwise negative.  ? If he has some IBS.  Seems somewhat anxious, but denies this and denies increased stressors.  Will treat his reflux maximally for now by increasing omeprazole to 40 mg BID and have him take zantac 150 mg at bedtime.  I will refer him to [facility] for consult to see what their opinion is regarding his symptoms and the gallstones.   CC:  Colon Branch, MD

## 2017-07-20 NOTE — Patient Instructions (Addendum)
We will schedule you for an appointment with [facility]. Please arrive at least 15 minutes early for registration. Make certain to bring a list of current medications, including any over the counter medications or vitamins. Also bring your co-pay if you have one as well as your insurance cards. [facility] is located at [address]. Should you need to reschedule your appointment, please contact them at [phone].  We have sent the following medications to your pharmacy for you to pick up at your convenience: Omeprazole 40 mg twice a day, 30-60 minutes before breakfast and dinner  Zantac 150 mg at bedtime.

## 2017-07-21 NOTE — Progress Notes (Signed)
This could very well could be symptomatic cholelithiasis without cholecystitis. I agree with surgical opinion

## 2017-07-26 ENCOUNTER — Telehealth: Payer: Self-pay | Admitting: Gastroenterology

## 2017-07-26 ENCOUNTER — Encounter: Payer: Self-pay | Admitting: Emergency Medicine

## 2017-07-26 NOTE — Telephone Encounter (Signed)
Patient wife states patient was suppose to be referred to [facility] per ov note on 1.22.19 and wants to check status of referral. Pt wife requesting call to pt after referral is sent.

## 2017-07-26 NOTE — Telephone Encounter (Signed)
Records have been faxed and refaxed today to [facility]. I did receive a confirmation on my end. I will call tomorrow if the patient does not receive a phone call today. I will message him via mychart.

## 2017-07-27 NOTE — Telephone Encounter (Signed)
Received fax from [facility] appt is scheduled on 07-29-17 with Dr. Marlou Starks. Patient was informed.

## 2017-07-29 ENCOUNTER — Ambulatory Visit: Payer: Self-pay | Admitting: General Surgery

## 2017-07-29 DIAGNOSIS — K8020 Calculus of gallbladder without cholecystitis without obstruction: Secondary | ICD-10-CM | POA: Diagnosis not present

## 2017-08-06 MED FILL — TRULICITY 1.5 MG/0.5 ML PEN: 1.5 | 28 days supply | Qty: 2 | Fill #3

## 2017-08-06 MED FILL — DULoxetine HCL 60 MG CPEP: 60 | 30 days supply | Qty: 60 | Fill #4

## 2017-08-12 ENCOUNTER — Encounter (HOSPITAL_COMMUNITY): Payer: Self-pay

## 2017-08-12 NOTE — Pre-Procedure Instructions (Addendum)
PAULETTE LYNCH  08/12/2017      [place] [place] [address] Phone: [phone] Fax: [phone]    Your procedure is scheduled on 08-25-2017 Wednesday  Report to [facility] Admitting at 6:30 AM  .  Call this number if you have problems the morning of surgery:  [phone]   Remember:  Do not eat food or drink liquids after midnight.   Take these medicines the morning of surgery with A SIP OF WATER    Atorvastatin(Lipitor) Carvedilol(Coreg) Duloxetine(Cymbalta) Fenofibrate(Lofibra) Gabapentin(Neurotin) Levetiracetam(Keppra) Omeprazole(Prilosec)   STOP ASPIRIN,ANTIINFLAMATORIES (IBUPROFEN,ALEVE,MOTRIN,ADVIL,GOODY'S POWDERS),HERBAL SUPPLEMENTS,FISH OIL,AND VITAMINS 5-7 DAYS PRIOR TO SURGERY   Drink the bottle of Ensure you were given before leaving for the hospital the morning of surgery. Try to drink it all at one time instead of sipping      How to Manage Your Diabetes Before and After Surgery  Why is it important to control my blood sugar before and after surgery? . Improving blood sugar levels before and after surgery helps healing and can limit problems. . A way of improving blood sugar control is eating a healthy diet by: o  Eating less sugar and carbohydrates o  Increasing activity/exercise o  Talking with your doctor about reaching your blood sugar goals . High blood sugars (greater than 180 mg/dL) can raise your risk of infections and slow your recovery, so you will need to focus on controlling your diabetes during the weeks before surgery. . Make sure that the doctor who takes care of your diabetes knows about your planned surgery including the date and location.  How do I manage my blood sugar before surgery? . Check your blood sugar at least 4 times a day, starting 2 days before surgery, to make sure that the level is not too high or  low. o Check your blood sugar the morning of your surgery when you wake up and every 2 hours until you get to the Short Stay unit. . If your blood sugar is less than 70 mg/dL, you will need to treat for low blood sugar: o Do not take insulin. o Treat a low blood sugar (less than 70 mg/dL) with  cup of clear juice (cranberry or apple), 4 glucose tablets, OR glucose gel. Recheck blood sugar in 15 minutes after treatment (to make sure it is greater than 70 mg/dL). If your blood sugar is not greater than 70 mg/dL on recheck, call [phone] o  for further instructions. . Report your blood sugar to the short stay nurse when you get to Short Stay.  . If you are admitted to the hospital after surgery: o Your blood sugar will be checked by the staff and you will probably be given insulin after surgery (instead of oral diabetes medicines) to make sure you have good blood sugar levels. o The goal for blood sugar control after surgery is 80-180 mg/dL.              WHAT DO I DO ABOUT MY DIABETES MEDICATION?   Marland Kitchen Do not take oral diabetes medicines (pills) the morning of surgery.Metformin,glimepiride(Amaryl) .    .  . The day of surgery, do not take other diabetes injectables, including Byetta (exenatide), Bydureon (exenatide ER), Victoza (liraglutide), or Trulicity (dulaglutide).  . If your CBG is greater than 220 mg/dL, you may take  of your sliding scale (correction) dose of insulin.  Other Instructions:          Patient Signature:  Date:   Nurse Signature:  Date:   Reviewed and Endorsed by [facility] Patient Education Committee, August 2015   Do not wear jewelry, make-up or nail polish.  Do not wear lotions, powders, or perfumes, or deodorant.  Do not shave 48 hours prior to surgery.  Men may shave face and neck.  Do not bring valuables to the hospital.  [facility] is not responsible for any belongings or valuables.  Contacts, dentures or bridgework may not be worn  into surgery.  Leave your suitcase in the car.  After surgery it may be brought to your room.  For patients admitted to the hospital, discharge time will be determined by your treatment team.  Patients discharged the day of surgery will not be allowed to drive home  Special Instructions: [facility] - Preparing for Surgery  Before surgery, you can play an important role.  Because skin is not sterile, your skin needs to be as free of germs as possible.  You can reduce the number of germs on you skin by washing with CHG (chlorahexidine gluconate) soap before surgery.  CHG is an antiseptic cleaner which kills germs and bonds with the skin to continue killing germs even after washing.  Please DO NOT use if you have an allergy to CHG or antibacterial soaps.  If your skin becomes reddened/irritated stop using the CHG and inform your nurse when you arrive at Short Stay.  Do not shave (including legs and underarms) for at least 48 hours prior to the first CHG shower.  You may shave your face.  Please follow these instructions carefully:   1.  Shower with CHG Soap the night before surgery and the   morning of Surgery.  2.  If you choose to wash your hair, wash your hair first as usual with your normal shampoo.  3.  After you shampoo, rinse your hair and body thoroughly to remove the  Shampoo.  4.  Use CHG as you would any other liquid soap.  You can apply chg directly  to the skin and wash gently with scrungie or a clean washcloth.  5.  Apply the CHG Soap to your body ONLY FROM THE NECK DOWN.   Do not use on open wounds or open sores.  Avoid contact with your eyes,  ears, mouth and genitals (private parts).  Wash genitals (private parts) with your normal soap.  6.  Wash thoroughly, paying special attention to the area where your surgery will be performed.  7.  Thoroughly rinse your body with warm water from the neck down.  8.  DO NOT shower/wash with your normal soap after using and rinsing o  the CHG  Soap.  9.  Pat yourself dry with a clean towel.            10.  Wear clean pajamas.            11.  Place clean sheets on your bed the night of your first shower and do not sleep with pets.  Day of Surgery  Do not apply any lotions/deodorants the morning of surgery.  Please wear clean clothes to the hospital/surgery center.    Please read over the following fact sheets that you were given. Coughing and Deep Breathing and Surgical Site Infection Prevention

## 2017-08-13 ENCOUNTER — Ambulatory Visit (INDEPENDENT_AMBULATORY_CARE_PROVIDER_SITE_OTHER): Payer: 59

## 2017-08-13 ENCOUNTER — Ambulatory Visit (INDEPENDENT_AMBULATORY_CARE_PROVIDER_SITE_OTHER): Payer: 59 | Admitting: Orthopaedic Surgery

## 2017-08-13 ENCOUNTER — Encounter (HOSPITAL_COMMUNITY): Payer: Self-pay

## 2017-08-13 ENCOUNTER — Encounter (HOSPITAL_COMMUNITY)
Admission: RE | Admit: 2017-08-13 | Discharge: 2017-08-13 | Disposition: A | Discharge status: Home / Self Care | Payer: 59 | Source: Ambulatory Visit | Attending: General Surgery | Admitting: General Surgery

## 2017-08-13 ENCOUNTER — Encounter (INDEPENDENT_AMBULATORY_CARE_PROVIDER_SITE_OTHER): Payer: Self-pay | Admitting: Orthopaedic Surgery

## 2017-08-13 VITALS — BP 136/90 | HR 77 | Resp 16 | Ht 69.0 in | Wt 256.0 lb

## 2017-08-13 DIAGNOSIS — Z79899 Other long term (current) drug therapy: Secondary | ICD-10-CM | POA: Insufficient documentation

## 2017-08-13 DIAGNOSIS — K219 Gastro-esophageal reflux disease without esophagitis: Secondary | ICD-10-CM | POA: Insufficient documentation

## 2017-08-13 DIAGNOSIS — E1122 Type 2 diabetes mellitus with diabetic chronic kidney disease: Secondary | ICD-10-CM | POA: Insufficient documentation

## 2017-08-13 DIAGNOSIS — G913 Post-traumatic hydrocephalus, unspecified: Secondary | ICD-10-CM | POA: Insufficient documentation

## 2017-08-13 DIAGNOSIS — I129 Hypertensive chronic kidney disease with stage 1 through stage 4 chronic kidney disease, or unspecified chronic kidney disease: Secondary | ICD-10-CM | POA: Insufficient documentation

## 2017-08-13 DIAGNOSIS — M25522 Pain in left elbow: Secondary | ICD-10-CM | POA: Diagnosis not present

## 2017-08-13 DIAGNOSIS — Z01812 Encounter for preprocedural laboratory examination: Secondary | ICD-10-CM | POA: Diagnosis not present

## 2017-08-13 DIAGNOSIS — N183 Chronic kidney disease, stage 3 (moderate): Secondary | ICD-10-CM | POA: Diagnosis not present

## 2017-08-13 DIAGNOSIS — K7460 Unspecified cirrhosis of liver: Secondary | ICD-10-CM | POA: Diagnosis not present

## 2017-08-13 HISTORY — DX: Sleep apnea, unspecified: G47.30

## 2017-08-13 HISTORY — DX: Adverse effect of unspecified anesthetic, initial encounter: T41.45XA

## 2017-08-13 HISTORY — DX: Myoneural disorder, unspecified: G70.9

## 2017-08-13 HISTORY — DX: Other complications of anesthesia, initial encounter: T88.59XA

## 2017-08-13 HISTORY — DX: Unspecified osteoarthritis, unspecified site: M19.90

## 2017-08-13 HISTORY — DX: Personal history of urinary calculi: Z87.442

## 2017-08-13 LAB — PROTIME-INR
INR: 1.11
Prothrombin Time: 14.3 seconds (ref 11.4–15.2)

## 2017-08-13 LAB — COMPREHENSIVE METABOLIC PANEL
ALBUMIN: 3.9 g/dL (ref 3.5–5.0)
ALK PHOS: 73 U/L (ref 38–126)
ALT: 39 U/L (ref 17–63)
ANION GAP: 13 (ref 5–15)
AST: 40 U/L (ref 15–41)
BUN: 22 mg/dL — ABNORMAL HIGH (ref 6–20)
CALCIUM: 9.4 mg/dL (ref 8.9–10.3)
CHLORIDE: 106 mmol/L (ref 101–111)
CO2: 20 mmol/L — AB (ref 22–32)
CREATININE: 1.67 mg/dL — AB (ref 0.61–1.24)
GFR calc Af Amer: 50 mL/min — ABNORMAL LOW (ref >60)
GFR calc non Af Amer: 43 mL/min — ABNORMAL LOW (ref >60)
Glucose, Bld: 192 mg/dL — ABNORMAL HIGH (ref 65–99)
Potassium: 4.3 mmol/L (ref 3.5–5.1)
SODIUM: 139 mmol/L (ref 135–145)
Total Bilirubin: 0.9 mg/dL (ref 0.3–1.2)
Total Protein: 7.8 g/dL (ref 6.5–8.1)

## 2017-08-13 LAB — CBC WITH DIFFERENTIAL/PLATELET
Basophils Absolute: 0 10*3/uL (ref 0.0–0.1)
Basophils Relative: 0 %
EOS ABS: 0.3 10*3/uL (ref 0.0–0.7)
Eosinophils Relative: 4 %
HCT: 42.6 % (ref 39.0–52.0)
HEMOGLOBIN: 14.8 g/dL (ref 13.0–17.0)
Lymphocytes Relative: 38 %
Lymphs Abs: 3.1 10*3/uL (ref 0.7–4.0)
MCH: 31.2 pg (ref 26.0–34.0)
MCHC: 34.7 g/dL (ref 30.0–36.0)
MCV: 89.7 fL (ref 78.0–100.0)
Monocytes Absolute: 0.4 10*3/uL (ref 0.1–1.0)
Monocytes Relative: 5 %
NEUTROS PCT: 53 %
Neutro Abs: 4.3 10*3/uL (ref 1.7–7.7)
Platelets: 141 10*3/uL — ABNORMAL LOW (ref 150–400)
RBC: 4.75 MIL/uL (ref 4.22–5.81)
RDW: 14.8 % (ref 11.5–15.5)
WBC: 8.2 10*3/uL (ref 4.0–10.5)

## 2017-08-13 LAB — GLUCOSE, CAPILLARY: GLUCOSE-CAPILLARY: 162 mg/dL — AB (ref 65–99)

## 2017-08-13 NOTE — Progress Notes (Signed)
Does not see a cardiologist Denies any problems with chest pain or shortness  Of breath   Pt. States that does not check his blood sugars on a regular basis. Instructed pt. To check the DOS and follow the instructions given to him.

## 2017-08-13 NOTE — Progress Notes (Signed)
Office Visit Note   Patient: James [name]           Date of Birth: 04/12/1956           MRN: [id number] Visit Date: 08/13/2017              Requested by: Colon Branch, [address] [place], [place] [address] PCP: Colon Branch, MD   Assessment & Plan: Visit Diagnoses:  1. Pain in left elbow     Plan: Areas of tenderness along the proximal left ulna just appears to be small fibroadipose nodules. I can't find any other pathology. I discussed these with him and just suggested protecting with elbow pads or joint Pennsaid. Samples given. We'll plan to see back as necessary  Follow-Up Instructions: Return if symptoms worsen or fail to improve.   Orders:  Orders Placed This Encounter  Procedures  . XR Elbow 2 Views Left   No orders of the defined types were placed in this encounter.     Procedures: No procedures performed   Clinical Data: No additional findings.   Subjective: Chief Complaint  Patient presents with  . Left Elbow - Pain    [name] is a 62 y o here today for Left elbow pain x several months. Denies injury. When he lays his L arm onto a solid surface, he feels pain and that there is "something in there moving around." hasn't grown Very irritating  No history of injury or trauma. Discomfort is is along the proximal ulnar border but distal to the olecranon. No skin changes. Has noted some small nodules beneath the skin but no skin change. No loss of motion of the elbow numbness or tingling. 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  HENT: Negative for hearing loss.   Respiratory: Negative for apnea, chest tightness and shortness of breath.   Cardiovascular: Negative for chest pain, palpitations and leg swelling.  Gastrointestinal: Positive for constipation and diarrhea. Negative for blood in stool.  Genitourinary: Negative for difficulty urinating.   Musculoskeletal: Negative for arthralgias, back pain, joint swelling, myalgias, neck pain and neck stiffness.  Neurological: Negative for weakness, numbness and headaches.  Hematological: Does not bruise/bleed easily.  Psychiatric/Behavioral: Positive for suicidal ideas. Negative for sleep disturbance. The patient is not nervous/anxious.      Objective: Vital Signs: BP 136/90   Pulse 77   Resp 16   Ht 5' 9"  (1.753 m)   Wt 256 lb (116.1 kg)   BMI 37.80 kg/m   Physical Exam  Ortho Exam awake alert and oriented 3. Comfortable sitting has some local tenderness along the ulnar border several inches distal to the olecranon. No skin changes. No numbness or tingling. No Tinel's. No loss of motion of elbow and pronation supination flexion or extension compared to the right elbow. No induration around the elbow biceps and triceps intact. No Tinel's along any of the major nerves. Several small fibroadipose nodules over the ulna which are uncomfortable but no skin changes. They are very mobile  Specialty Comments:  No specialty comments available.  Imaging: Xr Elbow 2 Views Left  Result Date: 08/13/2017 Films of the left elbow were obtained in 2 projections. Area of discomfort along the proximal ulna was negative for any obvious pathology. There is some prominence and hypertrophy about the coronoid. Some spurring along the distal humerus at the joint  line. Both could be consistent with some mild arthritis of the elbow joint    PMFS History: Patient Active Problem List   Diagnosis Date Noted  . RUQ abdominal pain 07/20/2017  . Gallstones 07/20/2017  . Idiopathic intracranial hypertension 01/14/2017  . REM behavioral disorder 01/14/2017  . Liver cirrhosis secondary to NASH (nonalcoholic steatohepatitis) ([place]) 01/02/2016  . H/O craniotomy 05/07/2015  . OSA on CPAP 05/07/2015  . Annual physical exam 04/08/2015  . PCP NOTES >>> 04/08/2015  . Hypersomnia with sleep apnea 01/28/2015  .  Severe obesity (BMI >= 40) ([place]) 01/28/2015  . Obstructive hydrocephalus 01/28/2015  . Chronic fatigue 01/28/2015  . Depression 09/04/2014  . OSA -- dx ~ 2012, cpap intolerant 09/04/2014  . Insomnia 04/26/2013  . Sigmoid diverticulitis 04/25/2013  . Diabetes with neuropathy 04/25/2013  . Cirrhosis ([place]) 04/25/2013  . Presence of cerebrospinal fluid drainage device 07/28/2011  . Psoriatic arthritis ([place]) 04/28/2011  . Headache 04/28/2011  . GERD (gastroesophageal reflux disease) 04/28/2011  . Urinary retention 04/28/2011  . Hypertension 04/28/2011  . Testosterone deficiency 04/28/2011   Past Medical History:  Diagnosis Date  . Acid reflux   . Arthritis   . Cholelithiasis   . Chronic headaches    on cymbalta  . Cirrhosis ([place])   . Colon polyps   . Complication of anesthesia    problems waking up from anesthesia  . Depression    on cymbalta  . Diabetes mellitus with neuropathy ([place])    borderline per primary MD  . Diverticulitis 03/2013  . Eczema   . Elevated LFTs   . Fatty liver   . History of kidney stones   . Hypertension   . Insomnia 04/26/2013  . Kidney stone   . Morbid obesity ([place])   . Neuromuscular disorder ([facility])    neuropathy  . Neuropathy   . Post-traumatic hydrocephalus    s/p shunts x 2 (first got infected )  . Psoriasis    sees Dr [name]  . Psoriatic arthritis ([place])   . 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 Sleep apnea    no CPAP     . Stomach ulcer   . Testosterone deficiency 04/28/2011    Family History  Problem Relation Age of Onset  . Healthy Mother   . Lung cancer Father        alive, former smoker   . Heart disease Brother        MI age 71  . Other Brother        Murdered  . Down syndrome Son   . Diabetes Neg Hx   . Prostate cancer Neg Hx   . Colon cancer Neg Hx     Past Surgical History:  Procedure Laterality Date  . BACK SURGERY  1980  . SHOULDER SURGERY Left 2010  . TOE SURGERY Left 2018   . TOTAL HIP ARTHROPLASTY Left 2011  . VENTRICULOPERITONEAL SHUNT  2007   x2   Social History   Occupational History  . Occupation: disable   Tobacco Use  . Smoking status: Never Smoker  . Smokeless tobacco: Never Used  Substance and Sexual Activity  . Alcohol use: No  . Drug use: No  . Sexual activity: Yes    Partners: Female

## 2017-08-16 ENCOUNTER — Telehealth: Payer: Self-pay | Admitting: Internal Medicine

## 2017-08-16 NOTE — Telephone Encounter (Signed)
Received message from [name], see below. I think he is ok to proceed w/ surgery , has well know mild renal insufficiency, needs to avoid hypotension and dehydration ; rec to hold metformin for 2-3 days post-op. Please advise pt to come to my office 3 days post op to check labs , thx JP ========= I am a NP with anesthesiology at [facility]. Your patient, James [name], is scheduled for a cholecystectomy with Dr. Autumn Messing on 08/25/17. At pre-admission testing, his renal function was worse than his usual baseline with a Cr of 1.67. Do you have any concerns with Mr. Berthelot proceeding with surgery as scheduled?   Thank you for your input.   [name], FNP-BC  [facility] Short Stay Surgical Center/Anesthesiology  Phone: [phone]

## 2017-08-16 NOTE — Progress Notes (Addendum)
Anesthesia Chart Review:  Pt is a 62 year old male scheduled for laparoscopic cholecystectomy with intraoperative cholangiogram on 08/25/2017 with [name], MD  - PCP is Kathlene November, MD - Endocrinologist is Elayne Snare, MD - Neurologist is Arlice Colt, MD - Nephrologist is Pearson Grippe, MD.  Last office visit 11/20/16; 1 year f/u recommended  PMH includes: HTN, DM, OSA, CKD (stage 3), fatty liver, cirrhosis, post-traumatic hydrocephalus (s/p shunt), GERD.  Never smoker.  BMI 38.  Medications include: Humira, Lipitor, carvedilol, fenofibrate, glimepiride, Keppra, metformin, Prilosec, Zantac, Trulicity  BP 763/94   Pulse 75   Temp 36.5 C   Resp 20   Ht 5' 11"  (1.803 m)   Wt 256 lb 12.8 oz (116.5 kg)   SpO2 98%   BMI 35.82 kg/m   Preoperative labs reviewed.   - Cr 1.67, BUN 22. Cr has ranged 1.16 - 1.75 over past year.  - Glucose 192. HbA1c was 7.3 on 07/15/17  EKG 10/15/16: Sinus rhythm. Abnormal R-wave progression, early transition. Borderline T abnormalities, lateral leads  Nuclear stress test 07/27/12:  - Normal stress nuclear study. - LV Ejection Fraction: 52%.  LV Wall Motion:  NL LV Function; NL Wall Motion  Stress echo 06/10/12:  - Stress ECG conclusions: The stress ECG was normal. - Impressions: Difficult acoustic windows. Normal study after maximal exercise.  [name], FNP-BC [facility] Short Stay Surgical Center/Anesthesiology Phone: [phone] 08/16/2017 4:49 PM  Addendum:   I reached out to Dr. [name] about pt's current renal function status. He responded "I think he is ok to proceed w/ surgery , has well know mild renal insufficiency, needs to avoid hypotension and dehydration ; rec to hold metformin for 2-3 days post-op. Please advise pt to come to my office 3 days post op to check labs"   I have notified pt and Abigail Butts in Dr. Ethlyn Gallery office of Dr. Ethel Rana recommendations.   If no changes, I anticipate pt can proceed with surgery as scheduled.   [name],  FNP-BC [facility] Short Stay Surgical Center/Anesthesiology Phone: [phone] 08/17/2017 9:37 AM

## 2017-08-17 ENCOUNTER — Ambulatory Visit: Payer: Self-pay | Admitting: *Deleted

## 2017-08-17 ENCOUNTER — Other Ambulatory Visit: Payer: Self-pay | Admitting: Endocrinology

## 2017-08-17 ENCOUNTER — Ambulatory Visit (INDEPENDENT_AMBULATORY_CARE_PROVIDER_SITE_OTHER): Payer: 59 | Admitting: Internal Medicine

## 2017-08-17 ENCOUNTER — Encounter: Payer: Self-pay | Admitting: Internal Medicine

## 2017-08-17 VITALS — BP 142/68 | HR 68 | Temp 98.0°F | Resp 14 | Ht 69.0 in | Wt 259.1 lb

## 2017-08-17 DIAGNOSIS — R079 Chest pain, unspecified: Secondary | ICD-10-CM | POA: Diagnosis not present

## 2017-08-17 DIAGNOSIS — E1122 Type 2 diabetes mellitus with diabetic chronic kidney disease: Secondary | ICD-10-CM

## 2017-08-17 DIAGNOSIS — N182 Chronic kidney disease, stage 2 (mild): Secondary | ICD-10-CM | POA: Diagnosis not present

## 2017-08-17 MED FILL — ATORVASTATIN 10 MG TABLET: 10 | 90 days supply | Qty: 90 | Fill #0

## 2017-08-17 MED FILL — levETIRAcetam 750 MG TABS: 750 | 30 days supply | Qty: 60 | Fill #5

## 2017-08-17 MED FILL — GLIMEPIRIDE 1 MG TABLET: 1 | 30 days supply | Qty: 30 | Fill #1

## 2017-08-17 MED FILL — CARVEDILOL 12.5 MG TABLET: 12.5 | 30 days supply | Qty: 60 | Fill #2

## 2017-08-17 MED FILL — FENOFIBRATE 134 MG CAPSULE: 134 | 30 days supply | Qty: 30 | Fill #0

## 2017-08-17 NOTE — Progress Notes (Signed)
Subjective:    Patient ID: James [name], male    DOB: Sep 22, 1955, 62 y.o.   MRN: [id number]  DOS:  08/17/2017 Type of visit - description : acute visit due to elevated BP, here with his wife.  He reports also chest pain. Interval history: Since the last office visit 02-2017 , he went to the ER complaining of upper abdominal pain, workup showed gallbladder stone by  CT, subsequently saw GI, then refer to surgery, they are scheduling him for a cholecystectomy. He continued to have on and off upper abdominal discomfort.  Not related to food.  On further questioning, the wife reports that he actually complained of chest pain, lasted 1 hour, nocturnal, located at the right/mid anterior chest, 2 episodes last week and the last one was last night. Apparently this pains are different from upper abdominal discomfort.  They are at rest, some associated nausea, no shortness of breath, no radiation, not related to food.  Recent   presurgery labs show slight increased creatinine. Finally, his BP today was elevated at 152/105.  BP Readings from Last 3 Encounters:  08/18/17 130/90  08/17/17 (!) 142/68  08/13/17 136/89    Review of Systems Able to do all his ADLs without difficulty breathing, no exertional chest pain. Denies blood in the stools Good p.o. Tolerance. Last night developed mild chills, mild cough. Having a bad headache today, history of chronic headaches  Past Medical History:  Diagnosis Date  . Acid reflux   . Arthritis   . Cholelithiasis   . Chronic headaches    on cymbalta  . Cirrhosis ([place])   . Colon polyps   . Complication of anesthesia    problems waking up from anesthesia  . Depression    on cymbalta  . Diabetes mellitus with neuropathy ([place])    borderline per primary MD  . Diverticulitis 03/2013  . Eczema   . Elevated LFTs   . Fatty liver   . History of kidney stones   . Hypertension   . Insomnia 04/26/2013  . Kidney stone   . Morbid obesity ([place])   .  Neuromuscular disorder (HCC)    neuropathy  . Neuropathy   . Post-traumatic hydrocephalus    s/p shunts x 2 (first got infected )  . Psoriasis    sees Dr [name]  . Psoriatic arthritis ([place])   . 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 Sleep apnea    no CPAP     . Stomach ulcer   . Testosterone deficiency 04/28/2011    Past Surgical History:  Procedure Laterality Date  . BACK SURGERY  1980  . SHOULDER SURGERY Left 2010  . TOE SURGERY Left 2018  . TOTAL HIP ARTHROPLASTY Left 2011  . VENTRICULOPERITONEAL SHUNT  2007   x2    Social History   Socioeconomic History  . Marital status: Married    Spouse name: [name]  . Number of children: 2  . Years of education: Not on file  . Highest education level: Not on file  Social Needs  . Financial resource strain: Not on file  . Food insecurity - worry: Not on file  . Food insecurity - inability: Not on file  . Transportation needs - medical: Not on file  . Transportation needs - non-medical: Not on file  Occupational History  . Occupation: disable   Tobacco Use  . Smoking status: Never Smoker  . Smokeless tobacco: Never Used  Substance and Sexual  Activity  . Alcohol use: No  . Drug use: No  . Sexual activity: Yes    Partners: Female  Other Topics Concern  . 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      Allergies as of 08/17/2017      Reactions   Hydrocodone-homatropine Other (See Comments)   Depressed feeling   Morphine And Related Other (See Comments)   Hallucinations, back in the 80s. States has taken vicodin before w/o problems    Sulfa Drugs Cross Reactors Rash      Medication List        Accurate as of 08/17/17 11:59 PM. Always use your most recent med list.          Adalimumab 40 MG/0.8ML Pnkt Commonly  known as:  HUMIRA PEN Inject 0.8 mLs into the skin every 14 (fourteen) days.   Armodafinil 250 MG tablet Commonly known as:  NUVIGIL Take 1 tablet (250 mg total) by mouth daily.   atorvastatin 10 MG tablet Commonly known as:  LIPITOR TAKE 1 TABLET (10 MG TOTAL) BY MOUTH DAILY.   carvedilol 12.5 MG tablet Commonly known as:  COREG Take 1 tablet (12.5 mg total) by mouth 2 (two) times daily with a meal.   clonazePAM 0.5 MG tablet Commonly known as:  KLONOPIN Take 1 tablet (0.5 mg total) by mouth at bedtime. Fax to [facility]  [phone]   cyclobenzaprine 5 MG tablet Commonly known as:  FLEXERIL Take 1 tablet (5 mg total) by mouth at bedtime.   DULoxetine 60 MG capsule Commonly known as:  CYMBALTA Take 2 capsules (120 mg total) by mouth daily.   fenofibrate micronized 134 MG capsule Commonly known as:  LOFIBRA TAKE 1 CAPSULE (134 MG TOTAL) BY MOUTH DAILY BEFORE BREAKFAST.   FREESTYLE FREEDOM LITE w/Device Kit Use to check blood sugars 3 times daily. Dx code: E11.9   freestyle lancets Use to check blood sugar 3 times daily. Dx code E11.9   gabapentin 600 MG tablet Commonly known as:  NEURONTIN Take 1 tablet (600 mg total) by mouth 3 (three) times daily.   glimepiride 1 MG tablet Commonly known as:  AMARYL Take 1 tablet (1 mg total) by mouth daily before supper.   glucose blood test strip Commonly known as:  ACCU-CHEK GUIDE Use to test blood sugar three times daily   levETIRAcetam 750 MG tablet Commonly known as:  KEPPRA Take 1 tablet (750 mg total) by mouth 2 (two) times daily.   metFORMIN 1000 MG tablet Commonly known as:  GLUCOPHAGE TAKE 1 TABLET BY MOUTH TWICE DAILY WITH A MEAL   naproxen sodium 220 MG tablet Commonly known as:  ALEVE Take 440 mg by mouth daily as needed (PAIN).   omeprazole 40 MG capsule Commonly known as:  PRILOSEC Take 1 capsule (40 mg total) by mouth 2 (two) times daily before a meal.   ranitidine 150 MG tablet Commonly known as:   ZANTAC Take 1 tablet (150 mg total) by mouth at bedtime.   sucralfate 1 GM/10ML suspension Commonly known as:  CARAFATE Take 10 mLs (1 g total) by mouth 4 (four) times daily -  with meals and at bedtime.   TRULICITY 1.5 HU/3.1SH Sopn Generic drug:  Dulaglutide INJECT THE CONTENTS OF ONE PEN ONCE PER WEEK  Objective:   Physical Exam  Musculoskeletal:       Arms:  BP (!) 142/68 (BP Location: Left Arm, Patient Position: Sitting, Cuff Size: Normal)   Pulse 68   Temp 98 F (36.7 C) (Oral)   Resp 14   Ht _0  (1.753 m)   Wt 259 lb 2 oz (117.5 kg)   SpO2 96%   BMI 38.27 kg/m  General:   Well developed, well nourished . NAD.  HEENT:  Normocephalic . Face symmetric, atraumatic Lungs:  CTA B Normal respiratory effort, no intercostal retractions, no accessory muscle use. Heart: RRR,  no murmur.  no pretibial edema bilaterally  Abdomen:  Not distended, soft, non-tender. No rebound or rigidity.   Skin: Not pale. Not jaundice Neurologic:  alert & oriented X3.  Speech normal, gait appropriate for age and unassisted Psych--  Cognition and judgment appear intact.  Cooperative with normal attention span and concentration.  Behavior appropriate. No anxious or depressed appearing.     Assessment & Plan:    Assessment  DM Neuropathy (x years, rx gaba 05-2014, w/u 11-2014  RPR neg, vit D-B12-Folic Acid wnl ); Saw Dr Posey Pronto, [place] 1. The electrophysiologic findings are most consistent with a chronic, distal and symmetric sensorimotor polyneuropathy, predominantly axon loss in type, affecting the lower extremities. 2. There is no evidence of a superimposed lumbosacral radiculopathy. CRI HTN Hyperlipidemia  OSA , dx 2012, sleep study again 02-2015 Dr Dohmeier--> severe OSA, intolerant to CPAP Depression, insomnia ----on Cymbalta Chronic headaches -----on Cymbalta MSK: on disability d/t back pain- HAs Hypogonadism  Dx 2012, normal T 11-2014 (on no RX) GI:    --GERD, diverticulitis 2014, h/o PUD --Fatty liver per [place] 05-2014 (? Of cirrhosis per CT 2014) -- NASH per GI note 08-2015  Psoriasis, psoriatic arthritis -- on HUMIRA  Posttraumatic hydrocephalus s/p 2 shunts (first got infected) H/u urolithiasis +FH CAD brother MI age 12  PLAN Chest pain: 3 episodes of nocturnal chest pain over the last week, last episode last night, lasted 1 hour, currently chest pain-free.  He has multiple cardiovascular RF.  This could be related to his gallbladder stone however CP is somewhat different to GB pain which is more abdominal.  Had a Myoview 5 years ago: (-). EKG today: Sinus rhythm Plan: card  referral ASAP.  Start aspirin.  ER if severe/persistent chest pain Cholelithiasis: Hold surgery until he is eval by cardiology HTN: Usually well controlled, BP today slightly elevated, currently on carvedilol, monitor BPs, consider adjust medications. Chronic kidney disease: creatinine fluctuates, last creatinine 4 days ago  1.67, slightly more than in the last year.  Recommend to have a BMP 2 days after  cholecystectomy DM: Hold metformin for 2-3 days after cholecystectomy Chronic headache: Having a headache today, not unusual for him.  Observation

## 2017-08-17 NOTE — Telephone Encounter (Signed)
Patient's wife is calling to reports patient's BP is elevated and he is not feeling well. PB reading - 153/105 and 152/101 with glucose 101. Call to patient - he states he is scheduled for surgery for his gallbladder and he had some discomfort with that last night. He answered protocol questions and retook his BP- 157/109 P 85. Per protocol patient should be seen within 24 hours. Diastolic approaching 292. Call to office and per office PCP will see him at 4:oo. Patient notified with instruction to go to ED if he has any changes. Reason for Disposition . Systolic BP  >= 909 OR Diastolic >= 030  Answer Assessment - Initial Assessment Questions 1. BLOOD PRESSURE: "What is the blood pressure?" "Did you take at least two measurements 5 minutes apart?"     153/105   152/101  2. ONSET: "When did you take your blood pressure?"     10:00 3. HOW: "How did you obtain the blood pressure?" (e.g., visiting nurse, automatic home BP monitor)     Automatic cuff 4. HISTORY: "Do you have a history of high blood pressure?"     yes 5. MEDICATIONS: "Are you taking any medications for blood pressure?" "Have you missed any doses recently?"     Yes- no missed doses 6. OTHER SYMPTOMS: "Do you have any symptoms?" (e.g., headache, chest pain, blurred vision, difficulty breathing, weakness)     Jumpy- gallbladder stone 7. PREGNANCY: "Is there any chance you are pregnant?" "When was your last menstrual period?"     n/a  Protocols used: HIGH BLOOD PRESSURE-A-AH

## 2017-08-17 NOTE — Telephone Encounter (Signed)
thx

## 2017-08-17 NOTE — Progress Notes (Signed)
Pre visit review using our clinic review tool, if applicable. No additional management support is needed unless otherwise documented below in the visit note. 

## 2017-08-17 NOTE — Patient Instructions (Signed)
Start Aspirin 81 mg 1 a day  ER if severe chest pain  Check the  blood pressure daily Be sure your blood pressure is between 110/65 and  135/85. If it is consistently higher or lower, let me know

## 2017-08-17 NOTE — Telephone Encounter (Signed)
FYI. Pt to come at [facility].

## 2017-08-18 ENCOUNTER — Telehealth (HOSPITAL_COMMUNITY): Payer: Self-pay | Admitting: *Deleted

## 2017-08-18 ENCOUNTER — Encounter: Payer: Self-pay | Admitting: Cardiology

## 2017-08-18 ENCOUNTER — Other Ambulatory Visit (HOSPITAL_COMMUNITY): Payer: 59

## 2017-08-18 ENCOUNTER — Ambulatory Visit (INDEPENDENT_AMBULATORY_CARE_PROVIDER_SITE_OTHER): Payer: 59 | Admitting: Cardiology

## 2017-08-18 VITALS — BP 130/90 | HR 86 | Ht 69.0 in | Wt 257.0 lb

## 2017-08-18 DIAGNOSIS — I10 Essential (primary) hypertension: Secondary | ICD-10-CM | POA: Diagnosis not present

## 2017-08-18 DIAGNOSIS — K7581 Nonalcoholic steatohepatitis (NASH): Secondary | ICD-10-CM

## 2017-08-18 DIAGNOSIS — R079 Chest pain, unspecified: Secondary | ICD-10-CM

## 2017-08-18 DIAGNOSIS — K7460 Unspecified cirrhosis of liver: Secondary | ICD-10-CM | POA: Diagnosis not present

## 2017-08-18 DIAGNOSIS — N182 Chronic kidney disease, stage 2 (mild): Secondary | ICD-10-CM

## 2017-08-18 DIAGNOSIS — E1122 Type 2 diabetes mellitus with diabetic chronic kidney disease: Secondary | ICD-10-CM

## 2017-08-18 DIAGNOSIS — G932 Benign intracranial hypertension: Secondary | ICD-10-CM

## 2017-08-18 NOTE — Telephone Encounter (Signed)
Patient given detailed instructions per Myocardial Perfusion Study Information Sheet for the test on 08/24/17 Patient notified to arrive 15 minutes early and that it is imperative to arrive on time for appointment to keep from having the test rescheduled.  If you need to cancel or reschedule your appointment, please call the office within 24 hours of your appointment. . Patient verbalized understanding.James [name]

## 2017-08-18 NOTE — Progress Notes (Signed)
Cardiology Consultation:    Date:  08/18/2017   ID:  James [name], DOB [birth year], MRN [id number]  PCP:  Colon Branch, MD  Cardiologist:  Jenne Campus, MD   Referring MD: Colon Branch, MD   Chief Complaint  Patient presents with  . Pre-op Exam    Gall bladder surgery  I need gallbladder surgery  History of Present Illness:    James [name] is a 62 y.o. male who is being seen today for the evaluation of heart before noncardiac surgery at the request of Colon Branch, MD.  He was referred to [place] for evaluation before gallbladder surgery.  He complained of having pain in the right upper quadrant.  CT of his abdomen was done he was find to have gallstone likely no evidence of active cholecystitis.  Does have multiple risk factors for coronary artery disease namely long-standing hypertension, long-standing diabetes, dyslipidemia.  Luckily he never smoked.  There is no family history of premature coronary artery disease.  He does not exercise on the regular basis he said his legs are bothering him he does have diabetic neuropathy involving lower extremities he said when he walks to have pain in his feet.  Denies having atypical tightness squeezing pressure burning chest that would be related to exercise.  Described however to have some sensation on the right upper quadrant of the abdomen as low as on the left side of his chest but not related to exercise lasting only 4 minutes.  Past Medical History:  Diagnosis Date  . Acid reflux   . Arthritis   . Cholelithiasis   . Chronic headaches    on cymbalta  . Cirrhosis ([place])   . Colon polyps   . Complication of anesthesia    problems waking up from anesthesia  . Depression    on cymbalta  . Diabetes mellitus with neuropathy ([place])    borderline per primary MD  . Diverticulitis 03/2013  . Eczema   . Elevated LFTs   . Fatty liver   . History of kidney stones   . Hypertension   . Insomnia 04/26/2013  . Kidney stone   . Morbid obesity  ([place])   . Neuromuscular disorder (HCC)    neuropathy  . Neuropathy   . Post-traumatic hydrocephalus    s/p shunts x 2 (first got infected )  . Psoriasis    sees Dr [name]  . Psoriatic arthritis ([place])   . 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 Sleep apnea    no CPAP     . Stomach ulcer   . Testosterone deficiency 04/28/2011    Past Surgical History:  Procedure Laterality Date  . BACK SURGERY  1980  . SHOULDER SURGERY Left 2010  . TOE SURGERY Left 2018  . TOTAL HIP ARTHROPLASTY Left 2011  . VENTRICULOPERITONEAL SHUNT  2007   x2    Current Medications: Current Meds  Medication Sig  . Adalimumab (HUMIRA PEN) 40 MG/0.8ML PNKT Inject 0.8 mLs into the skin every 14 (fourteen) days.  Marland Kitchen atorvastatin (LIPITOR) 10 MG tablet TAKE 1 TABLET (10 MG TOTAL) BY MOUTH DAILY.  Marland Kitchen Blood Glucose Monitoring Suppl (FREESTYLE FREEDOM LITE) w/Device KIT Use to check blood sugars 3 times daily. Dx code: E11.9  . carvedilol (COREG) 12.5 MG tablet Take 1 tablet (12.5 mg total) by mouth 2 (two) times daily with a meal.  . clonazePAM (KLONOPIN) 0.5 MG tablet Take 1 tablet (0.5 mg total)  by mouth at bedtime. Fax to [facility]  [phone]  . DULoxetine (CYMBALTA) 60 MG capsule Take 2 capsules (120 mg total) by mouth daily. (Patient taking differently: Take 60 mg by mouth 2 (two) times daily. )  . fenofibrate micronized (LOFIBRA) 134 MG capsule TAKE 1 CAPSULE (134 MG TOTAL) BY MOUTH DAILY BEFORE BREAKFAST.  Marland Kitchen gabapentin (NEURONTIN) 600 MG tablet Take 1 tablet (600 mg total) by mouth 3 (three) times daily.  Marland Kitchen glimepiride (AMARYL) 1 MG tablet Take 1 tablet (1 mg total) by mouth daily before supper.  Marland Kitchen glucose blood (ACCU-CHEK GUIDE) test strip Use to test blood sugar three times daily  . Lancets (FREESTYLE) lancets Use to check blood sugar 3 times daily. Dx code E11.9  . levETIRAcetam (KEPPRA) 750 MG tablet Take 1 tablet (750 mg total) by mouth 2 (two) times daily.  .  metFORMIN (GLUCOPHAGE) 1000 MG tablet TAKE 1 TABLET BY MOUTH TWICE DAILY WITH A MEAL  . naproxen sodium (ALEVE) 220 MG tablet Take 440 mg by mouth daily as needed (PAIN).  Marland Kitchen omeprazole (PRILOSEC) 40 MG capsule Take 1 capsule (40 mg total) by mouth 2 (two) times daily before a meal.  . TRULICITY 1.5 NO/0.3BC SOPN INJECT THE CONTENTS OF ONE PEN ONCE PER WEEK     Allergies:   Hydrocodone-homatropine; Morphine and related; and Sulfa drugs cross reactors   Social History   Socioeconomic History  . Marital status: Married    Spouse name: [name]  . Number of children: 2  . Years of education: None  . Highest education level: None  Social Needs  . Financial resource strain: None  . Food insecurity - worry: None  . Food insecurity - inability: None  . Transportation needs - medical: None  . Transportation needs - non-medical: None  Occupational History  . Occupation: disable   Tobacco Use  . Smoking status: Never Smoker  . Smokeless tobacco: Never Used  Substance and Sexual Activity  . Alcohol use: No  . Drug use: No  . Sexual activity: Yes    Partners: Female  Other Topics Concern  . 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     Family History: The patient's family history includes Down syndrome in his son; Healthy in his mother; Heart disease in his brother; Lung cancer in his father; Other in his brother. There is no history of Diabetes, Prostate cancer, or Colon cancer. ROS:   Please see the history of present illness.    All 14 point review of systems negative except as described per history of present illness.  EKGs/Labs/Other Studies Reviewed:    The following studies were reviewed today: CT of his abdomen has been reviewed.  EKG:  EKG is  ordered today.  The ekg ordered today demonstrates EKG showed normal sinus  rhythm with some APCs.  There is questionable Q in lead aVF and III.  I cannot rule out possibility of inferior wall microinfarction.  Nonspecific ST segment changes.  Recent Labs: 08/13/2017: ALT 39; BUN 22; Creatinine, Ser 1.67; Hemoglobin 14.8; Platelets 141; Potassium 4.3; Sodium 139  Recent Lipid Panel    Component Value Date/Time   CHOL 164 07/15/2017 1112   TRIG 212.0 (H) 07/15/2017 1112   HDL 39.40 07/15/2017 1112   CHOLHDL 4 07/15/2017 1112   VLDL 42.4 (H) 07/15/2017 1112  LDLCALC 137 (H) 09/07/2016 1025   LDLDIRECT 101.0 07/15/2017 1112    Physical Exam:    VS:  BP 130/90 (BP Location: Right Arm, Patient Position: Sitting, Cuff Size: Large)   Pulse 86   Ht 5' 9"  (1.753 m)   Wt 257 lb (116.6 kg)   SpO2 96%   BMI 37.95 kg/m     Wt Readings from Last 3 Encounters:  08/18/17 257 lb (116.6 kg)  08/17/17 259 lb 2 oz (117.5 kg)  08/13/17 256 lb 12.8 oz (116.5 kg)     GEN:  Well nourished, well developed in no acute distress HEENT: Normal NECK: No JVD; No carotid bruits LYMPHATICS: No lymphadenopathy CARDIAC: RRR, no murmurs, no rubs, no gallops RESPIRATORY:  Clear to auscultation without rales, wheezing or rhonchi  ABDOMEN: Soft, non-tender, non-distended MUSCULOSKELETAL:  No edema; No deformity  SKIN: Warm and dry NEUROLOGIC:  Alert and oriented x 3 PSYCHIATRIC:  Normal affect   ASSESSMENT:    1. Essential hypertension   2. Chest pain, unspecified type   3. Idiopathic intracranial hypertension   4. Cirrhosis of liver without ascites, unspecified hepatic cirrhosis type ([place])   5. Liver cirrhosis secondary to NASH (nonalcoholic steatohepatitis) ([place])   6. Type 2 diabetes mellitus with stage 2 chronic kidney disease, without long-term current use of insulin (HCC)    PLAN:    In order of problems listed above:  1. Preop evaluation for this gentleman with multiple risk factors for coronary artery disease.  I recommend stress testing to rule out significant  obstructive disease.  I did review his record he did have a stress test done a few years ago actually twice both of them were negative but because of his risk factors I think it still reasonable to perform another stress testing.  As a part of evaluation of these heart I will ask him to have an echocardiogram to assess his left ventricular ejection fraction and right ventricle pressure.  Ideally he need to be taking one baby aspirin every single day and he also need to be on statin however because of liver cirrhosis we need to be careful with statin therapy.  He does have elevated LDL but that can improve after cholecystectomy. 2. Idiopathic intracranial hypertension: Status post shunt is stable. 3. Liver cirrhosis which is not related to alcohol.  Apparently stable. 4. Dyslipidemia: On moderate intensity statin which I will continue for now   Medication Adjustments/Labs and Tests Ordered: Current medicines are reviewed at length with the patient today.  Concerns regarding medicines are outlined above.  Orders Placed This Encounter  Procedures  . Myocardial Perfusion Imaging  . ECHOCARDIOGRAM COMPLETE   No orders of the defined types were placed in this encounter.   Signed, Park Liter, MD, [facility]. 08/18/2017 11:19 AM    [place]

## 2017-08-18 NOTE — Assessment & Plan Note (Signed)
Chest pain: 3 episodes of nocturnal chest pain over the last week, last episode last night, lasted 1 hour, currently chest pain-free.  He has multiple cardiovascular RF.  This could be related to his gallbladder stone however CP is somewhat different to GB pain which is more abdominal.  Had a Myoview 5 years ago: (-). EKG today: Sinus rhythm Plan: card  referral ASAP.  Start aspirin.  ER if severe/persistent chest pain Cholelithiasis: Hold surgery until he is eval by cardiology HTN: Usually well controlled, BP today slightly elevated, currently on carvedilol, monitor BPs, consider adjust medications. Chronic kidney disease: creatinine fluctuates, last creatinine 4 days ago  1.67, slightly more than in the last year.  Recommend to have a BMP 2 days after  cholecystectomy DM: Hold metformin for 2-3 days after cholecystectomy Chronic headache: Having a headache today, not unusual for him.  Observation

## 2017-08-18 NOTE — Patient Instructions (Signed)
Medication Instructions:  Your physician recommends that you continue on your current medications as directed. Please refer to the Current Medication list given to you today.  Labwork: None ordered  Testing/Procedures: Your physician has requested that you have an echocardiogram. Echocardiography is a painless test that uses sound waves to create images of your heart. It provides your doctor with information about the size and shape of your heart and how well your heart's chambers and valves are working. This procedure takes approximately one hour. There are no restrictions for this procedure.  Your physician has requested that you have a lexiscan myoview. For further information please visit [email or web address]. Please follow instruction sheet, as given.  Follow-Up: Your physician recommends that you schedule a follow-up appointment in: 1 month with Dr. Agustin Cree   Any Other Special Instructions Will Be Listed Below (If Applicable).     If you need a refill on your cardiac medications before your next appointment, please call your pharmacy.

## 2017-08-24 ENCOUNTER — Ambulatory Visit (HOSPITAL_COMMUNITY): Payer: 59 | Attending: Cardiovascular Disease

## 2017-08-24 ENCOUNTER — Ambulatory Visit (HOSPITAL_BASED_OUTPATIENT_CLINIC_OR_DEPARTMENT_OTHER): Payer: 59

## 2017-08-24 VITALS — Ht 69.0 in | Wt 257.0 lb

## 2017-08-24 DIAGNOSIS — R079 Chest pain, unspecified: Secondary | ICD-10-CM

## 2017-08-24 DIAGNOSIS — I10 Essential (primary) hypertension: Secondary | ICD-10-CM

## 2017-08-24 DIAGNOSIS — R51 Headache: Secondary | ICD-10-CM

## 2017-08-24 DIAGNOSIS — R519 Headache, unspecified: Secondary | ICD-10-CM

## 2017-08-24 LAB — ECHOCARDIOGRAM COMPLETE
Height: 69 in
WEIGHTICAEL: 4112 [oz_av]

## 2017-08-24 LAB — MYOCARDIAL PERFUSION IMAGING
CHL CUP NUCLEAR SRS: 7
LHR: 0.3
LV dias vol: 114 mL (ref 62–150)
LV sys vol: 53 mL
NUC STRESS TID: 1.05
SDS: 0
SSS: 7

## 2017-08-24 MED ORDER — TECHNETIUM TC 99M TETROFOSMIN IV KIT
33.0000 | PACK | Freq: Once | INTRAVENOUS | Status: AC | PRN
  Administered 2017-08-24: 33 via INTRAVENOUS
  Filled 2017-08-24: qty 33

## 2017-08-24 MED ORDER — REGADENOSON 0.4 MG/5ML IV SOLN
0.4000 mg | Freq: Once | INTRAVENOUS | Status: AC
  Administered 2017-08-24: 0.4 mg via INTRAVENOUS

## 2017-08-24 MED ORDER — AMINOPHYLLINE 25 MG/ML IV SOLN
75.0000 mg | Freq: Once | INTRAVENOUS | Status: AC
  Administered 2017-08-24: 75 mg via INTRAVENOUS

## 2017-08-24 MED ORDER — TECHNETIUM TC 99M TETROFOSMIN IV KIT
10.2000 | PACK | Freq: Once | INTRAVENOUS | Status: AC | PRN
  Administered 2017-08-24: 10.2 via INTRAVENOUS
  Filled 2017-08-24: qty 11

## 2017-08-25 ENCOUNTER — Encounter (HOSPITAL_COMMUNITY)
Admission: RE | Disposition: A | Discharge status: Home / Self Care | Payer: Self-pay | Source: Ambulatory Visit | Attending: General Surgery

## 2017-08-25 ENCOUNTER — Ambulatory Visit (HOSPITAL_COMMUNITY): Payer: 59 | Admitting: Emergency Medicine

## 2017-08-25 ENCOUNTER — Other Ambulatory Visit: Payer: Self-pay

## 2017-08-25 ENCOUNTER — Ambulatory Visit (HOSPITAL_COMMUNITY): Payer: 59

## 2017-08-25 ENCOUNTER — Ambulatory Visit (HOSPITAL_COMMUNITY)
Admission: RE | Admit: 2017-08-25 | Discharge: 2017-08-26 | Disposition: A | Discharge status: Home / Self Care | Payer: 59 | Source: Ambulatory Visit | Attending: General Surgery | Admitting: General Surgery

## 2017-08-25 ENCOUNTER — Encounter (HOSPITAL_COMMUNITY): Payer: Self-pay

## 2017-08-25 ENCOUNTER — Ambulatory Visit (HOSPITAL_COMMUNITY): Payer: 59 | Admitting: Certified Registered"

## 2017-08-25 DIAGNOSIS — K8010 Calculus of gallbladder with chronic cholecystitis without obstruction: Secondary | ICD-10-CM | POA: Diagnosis not present

## 2017-08-25 DIAGNOSIS — Z882 Allergy status to sulfonamides status: Secondary | ICD-10-CM | POA: Diagnosis not present

## 2017-08-25 DIAGNOSIS — K7460 Unspecified cirrhosis of liver: Secondary | ICD-10-CM | POA: Diagnosis not present

## 2017-08-25 DIAGNOSIS — G4730 Sleep apnea, unspecified: Secondary | ICD-10-CM | POA: Diagnosis not present

## 2017-08-25 DIAGNOSIS — E7800 Pure hypercholesterolemia, unspecified: Secondary | ICD-10-CM | POA: Insufficient documentation

## 2017-08-25 DIAGNOSIS — K219 Gastro-esophageal reflux disease without esophagitis: Secondary | ICD-10-CM | POA: Diagnosis not present

## 2017-08-25 DIAGNOSIS — Z79899 Other long term (current) drug therapy: Secondary | ICD-10-CM | POA: Insufficient documentation

## 2017-08-25 DIAGNOSIS — I10 Essential (primary) hypertension: Secondary | ICD-10-CM | POA: Insufficient documentation

## 2017-08-25 DIAGNOSIS — E1140 Type 2 diabetes mellitus with diabetic neuropathy, unspecified: Secondary | ICD-10-CM | POA: Diagnosis not present

## 2017-08-25 DIAGNOSIS — E119 Type 2 diabetes mellitus without complications: Secondary | ICD-10-CM | POA: Diagnosis not present

## 2017-08-25 DIAGNOSIS — Z7984 Long term (current) use of oral hypoglycemic drugs: Secondary | ICD-10-CM | POA: Insufficient documentation

## 2017-08-25 DIAGNOSIS — K8020 Calculus of gallbladder without cholecystitis without obstruction: Secondary | ICD-10-CM | POA: Diagnosis not present

## 2017-08-25 DIAGNOSIS — Z419 Encounter for procedure for purposes other than remedying health state, unspecified: Secondary | ICD-10-CM

## 2017-08-25 HISTORY — PX: CHOLECYSTECTOMY: SHX55

## 2017-08-25 LAB — GLUCOSE, CAPILLARY
GLUCOSE-CAPILLARY: 137 mg/dL — AB (ref 65–99)
GLUCOSE-CAPILLARY: 315 mg/dL — AB (ref 65–99)
Glucose-Capillary: 232 mg/dL — ABNORMAL HIGH (ref 65–99)
Glucose-Capillary: 296 mg/dL — ABNORMAL HIGH (ref 65–99)
Glucose-Capillary: 430 mg/dL — ABNORMAL HIGH (ref 65–99)
Glucose-Capillary: 366 mg/dL — ABNORMAL HIGH (ref 65–99)

## 2017-08-25 SURGERY — LAPAROSCOPIC CHOLECYSTECTOMY WITH INTRAOPERATIVE CHOLANGIOGRAM
Anesthesia: General | Site: Abdomen

## 2017-08-25 MED ORDER — OXYCODONE-ACETAMINOPHEN 5-325 MG PO TABS
1.0000 | ORAL_TABLET | ORAL | Status: DC | PRN
  Administered 2017-08-25 – 2017-08-26 (×3): 2 via ORAL
  Filled 2017-08-25 (×3): qty 2

## 2017-08-25 MED ORDER — KETAMINE HCL-SODIUM CHLORIDE 100-0.9 MG/10ML-% IV SOSY
PREFILLED_SYRINGE | INTRAVENOUS | Status: AC
  Filled 2017-08-25: qty 10

## 2017-08-25 MED ORDER — PROPOFOL 10 MG/ML IV BOLUS
INTRAVENOUS | Status: AC
  Filled 2017-08-25: qty 20

## 2017-08-25 MED ORDER — DEXAMETHASONE SODIUM PHOSPHATE 10 MG/ML IJ SOLN
INTRAMUSCULAR | Status: DC | PRN
  Administered 2017-08-25: 10 mg via INTRAVENOUS

## 2017-08-25 MED ORDER — FENTANYL CITRATE (PF) 100 MCG/2ML IJ SOLN: 25.0000 ug | INTRAMUSCULAR | Status: DC | PRN

## 2017-08-25 MED ORDER — LACTATED RINGERS IV SOLN
INTRAVENOUS | Status: DC
  Administered 2017-08-25 (×2): via INTRAVENOUS

## 2017-08-25 MED ORDER — ONDANSETRON HCL 4 MG/2ML IJ SOLN
INTRAMUSCULAR | Status: DC | PRN
  Administered 2017-08-25: 4 mg via INTRAVENOUS

## 2017-08-25 MED ORDER — INSULIN ASPART 100 UNIT/ML ~~LOC~~ SOLN
0.0000 [IU] | Freq: Three times a day (TID) | SUBCUTANEOUS | Status: DC
  Administered 2017-08-25 – 2017-08-26 (×2): 5 [IU] via SUBCUTANEOUS
  Administered 2017-08-26: 3 [IU] via SUBCUTANEOUS

## 2017-08-25 MED ORDER — GABAPENTIN 300 MG PO CAPS
ORAL_CAPSULE | ORAL | Status: AC
  Filled 2017-08-25: qty 1

## 2017-08-25 MED ORDER — INSULIN ASPART 100 UNIT/ML ~~LOC~~ SOLN
15.0000 [IU] | Freq: Once | SUBCUTANEOUS | Status: AC
  Administered 2017-08-25: 15 [IU] via SUBCUTANEOUS

## 2017-08-25 MED ORDER — FENTANYL CITRATE (PF) 250 MCG/5ML IJ SOLN
INTRAMUSCULAR | Status: DC | PRN
  Administered 2017-08-25: 100 ug via INTRAVENOUS

## 2017-08-25 MED ORDER — CHLORHEXIDINE GLUCONATE CLOTH 2 % EX PADS: 6.0000 | MEDICATED_PAD | Freq: Once | CUTANEOUS | Status: DC

## 2017-08-25 MED ORDER — BUPIVACAINE-EPINEPHRINE 0.5% -1:200000 IJ SOLN
INTRAMUSCULAR | Status: DC | PRN
  Administered 2017-08-25: 20 mL

## 2017-08-25 MED ORDER — SODIUM CHLORIDE 0.9 % IV SOLN
INTRAVENOUS | Status: DC | PRN
  Administered 2017-08-25: 15 mL

## 2017-08-25 MED ORDER — PHENYLEPHRINE 40 MCG/ML (10ML) SYRINGE FOR IV PUSH (FOR BLOOD PRESSURE SUPPORT)
PREFILLED_SYRINGE | INTRAVENOUS | Status: DC | PRN
  Administered 2017-08-25 (×2): 200 ug via INTRAVENOUS

## 2017-08-25 MED ORDER — POTASSIUM CHLORIDE IN NACL 20-0.9 MEQ/L-% IV SOLN: INTRAVENOUS | Status: DC

## 2017-08-25 MED ORDER — DULOXETINE HCL 60 MG PO CPEP
60.0000 mg | ORAL_CAPSULE | Freq: Two times a day (BID) | ORAL | Status: DC
  Administered 2017-08-25 – 2017-08-26 (×2): 60 mg via ORAL
  Filled 2017-08-25 (×2): qty 1

## 2017-08-25 MED ORDER — SUCCINYLCHOLINE CHLORIDE 200 MG/10ML IV SOSY
PREFILLED_SYRINGE | INTRAVENOUS | Status: DC | PRN
  Administered 2017-08-25: 120 mg via INTRAVENOUS

## 2017-08-25 MED ORDER — FENTANYL CITRATE (PF) 100 MCG/2ML IJ SOLN
25.0000 ug | INTRAMUSCULAR | Status: DC | PRN
  Administered 2017-08-25 (×2): 50 ug via INTRAVENOUS
  Filled 2017-08-25 (×2): qty 2

## 2017-08-25 MED ORDER — PHENYLEPHRINE HCL 10 MG/ML IJ SOLN
INTRAVENOUS | Status: DC | PRN
  Administered 2017-08-25: 50 ug/min via INTRAVENOUS

## 2017-08-25 MED ORDER — SODIUM CHLORIDE 0.9 % IR SOLN
Status: DC | PRN
  Administered 2017-08-25: 1000 mL

## 2017-08-25 MED ORDER — ACETAMINOPHEN 325 MG PO TABS: 325.0000 mg | ORAL_TABLET | ORAL | Status: DC | PRN

## 2017-08-25 MED ORDER — ACETAMINOPHEN 500 MG PO TABS
ORAL_TABLET | ORAL | Status: AC
  Administered 2017-08-25: 1000 mg via ORAL
  Filled 2017-08-25: qty 2

## 2017-08-25 MED ORDER — INSULIN ASPART 100 UNIT/ML ~~LOC~~ SOLN: 0.0000 [IU] | Freq: Every day | SUBCUTANEOUS | Status: DC

## 2017-08-25 MED ORDER — CARVEDILOL 12.5 MG PO TABS
12.5000 mg | ORAL_TABLET | Freq: Two times a day (BID) | ORAL | Status: DC
  Administered 2017-08-25 – 2017-08-26 (×2): 12.5 mg via ORAL
  Filled 2017-08-25 (×2): qty 1

## 2017-08-25 MED ORDER — GABAPENTIN 600 MG PO TABS
600.0000 mg | ORAL_TABLET | Freq: Three times a day (TID) | ORAL | Status: DC
  Administered 2017-08-25 – 2017-08-26 (×3): 600 mg via ORAL
  Filled 2017-08-25 (×3): qty 1

## 2017-08-25 MED ORDER — INSULIN ASPART 100 UNIT/ML ~~LOC~~ SOLN
20.0000 [IU] | Freq: Once | SUBCUTANEOUS | Status: AC
  Administered 2017-08-25: 20 [IU] via SUBCUTANEOUS

## 2017-08-25 MED ORDER — ACETAMINOPHEN 500 MG PO TABS
1000.0000 mg | ORAL_TABLET | ORAL | Status: AC
  Administered 2017-08-25: 1000 mg via ORAL

## 2017-08-25 MED ORDER — GABAPENTIN 300 MG PO CAPS: 300.0000 mg | ORAL_CAPSULE | ORAL | Status: DC

## 2017-08-25 MED ORDER — FENTANYL CITRATE (PF) 100 MCG/2ML IJ SOLN
25.0000 ug | INTRAMUSCULAR | Status: DC | PRN
  Administered 2017-08-25 (×2): 50 ug via INTRAVENOUS

## 2017-08-25 MED ORDER — PROPOFOL 10 MG/ML IV BOLUS
INTRAVENOUS | Status: DC | PRN
  Administered 2017-08-25: 200 mg via INTRAVENOUS

## 2017-08-25 MED ORDER — FENTANYL CITRATE (PF) 100 MCG/2ML IJ SOLN
INTRAMUSCULAR | Status: AC
  Administered 2017-08-25: 50 ug via INTRAVENOUS
  Filled 2017-08-25: qty 2

## 2017-08-25 MED ORDER — FENTANYL CITRATE (PF) 250 MCG/5ML IJ SOLN
INTRAMUSCULAR | Status: AC
  Filled 2017-08-25: qty 5

## 2017-08-25 MED ORDER — LEVETIRACETAM 750 MG PO TABS
750.0000 mg | ORAL_TABLET | Freq: Two times a day (BID) | ORAL | Status: DC
  Administered 2017-08-25 – 2017-08-26 (×2): 750 mg via ORAL
  Filled 2017-08-25 (×2): qty 1

## 2017-08-25 MED ORDER — HEMOSTATIC AGENTS (NO CHARGE) OPTIME
TOPICAL | Status: DC | PRN
  Administered 2017-08-25: 1 via TOPICAL

## 2017-08-25 MED ORDER — IOPAMIDOL (ISOVUE-300) INJECTION 61%
INTRAVENOUS | Status: AC
  Filled 2017-08-25: qty 50

## 2017-08-25 MED ORDER — SUGAMMADEX SODIUM 200 MG/2ML IV SOLN
INTRAVENOUS | Status: DC | PRN
  Administered 2017-08-25: 250 mg via INTRAVENOUS

## 2017-08-25 MED ORDER — 0.9 % SODIUM CHLORIDE (POUR BTL) OPTIME
TOPICAL | Status: DC | PRN
  Administered 2017-08-25: 1000 mL

## 2017-08-25 MED ORDER — LIDOCAINE 2% (20 MG/ML) 5 ML SYRINGE
INTRAMUSCULAR | Status: DC | PRN
  Administered 2017-08-25: 60 mg via INTRAVENOUS

## 2017-08-25 MED ORDER — METFORMIN HCL 500 MG PO TABS
1000.0000 mg | ORAL_TABLET | Freq: Every day | ORAL | Status: DC
  Administered 2017-08-26: 1000 mg via ORAL
  Filled 2017-08-25: qty 2

## 2017-08-25 MED ORDER — ONDANSETRON 4 MG PO TBDP: 4.0000 mg | ORAL_TABLET | Freq: Four times a day (QID) | ORAL | Status: DC | PRN

## 2017-08-25 MED ORDER — OXYCODONE HCL 5 MG/5ML PO SOLN: 5.0000 mg | Freq: Once | ORAL | Status: DC | PRN

## 2017-08-25 MED ORDER — PANTOPRAZOLE SODIUM 40 MG PO TBEC
40.0000 mg | DELAYED_RELEASE_TABLET | Freq: Every day | ORAL | Status: DC
  Administered 2017-08-26: 40 mg via ORAL
  Filled 2017-08-25 (×2): qty 1

## 2017-08-25 MED ORDER — ONDANSETRON HCL 4 MG/2ML IJ SOLN: 4.0000 mg | Freq: Four times a day (QID) | INTRAMUSCULAR | Status: DC | PRN

## 2017-08-25 MED ORDER — ACETAMINOPHEN 160 MG/5ML PO SOLN: 325.0000 mg | ORAL | Status: DC | PRN

## 2017-08-25 MED ORDER — GLIMEPIRIDE 1 MG PO TABS
1.0000 mg | ORAL_TABLET | Freq: Every day | ORAL | Status: DC
  Administered 2017-08-25: 1 mg via ORAL
  Filled 2017-08-25: qty 1

## 2017-08-25 MED ORDER — ROCURONIUM BROMIDE 10 MG/ML (PF) SYRINGE
PREFILLED_SYRINGE | INTRAVENOUS | Status: DC | PRN
  Administered 2017-08-25: 50 mg via INTRAVENOUS
  Administered 2017-08-25: 20 mg via INTRAVENOUS

## 2017-08-25 MED ORDER — MIDAZOLAM HCL 2 MG/2ML IJ SOLN
INTRAMUSCULAR | Status: AC
  Filled 2017-08-25: qty 2

## 2017-08-25 MED ORDER — LIDOCAINE IN D5W 4-5 MG/ML-% IV SOLN
1.0000 mg/min | INTRAVENOUS | Status: AC
  Administered 2017-08-25: 25 ug/kg/min via INTRAVENOUS
  Filled 2017-08-25: qty 500

## 2017-08-25 MED ORDER — CEFAZOLIN SODIUM-DEXTROSE 2-4 GM/100ML-% IV SOLN
2.0000 g | INTRAVENOUS | Status: AC
  Administered 2017-08-25: 2 g via INTRAVENOUS

## 2017-08-25 MED ORDER — OXYCODONE HCL 5 MG PO TABS: 5.0000 mg | ORAL_TABLET | Freq: Once | ORAL | Status: DC | PRN

## 2017-08-25 MED ORDER — BUPIVACAINE-EPINEPHRINE (PF) 0.5% -1:200000 IJ SOLN
INTRAMUSCULAR | Status: AC
  Filled 2017-08-25: qty 30

## 2017-08-25 MED ORDER — CEFAZOLIN SODIUM-DEXTROSE 2-4 GM/100ML-% IV SOLN
INTRAVENOUS | Status: AC
  Filled 2017-08-25: qty 100

## 2017-08-25 MED ORDER — FENOFIBRATE 54 MG PO TABS
54.0000 mg | ORAL_TABLET | Freq: Every day | ORAL | Status: DC
  Administered 2017-08-26: 54 mg via ORAL
  Filled 2017-08-25: qty 1

## 2017-08-25 SURGICAL SUPPLY — 36 items
APPLIER CLIP 5 13 M/L LIGAMAX5 (MISCELLANEOUS) ×2
BLADE CLIPPER SURG (BLADE) IMPLANT
CANISTER SUCT 3000ML PPV (MISCELLANEOUS) ×2 IMPLANT
CATH REDDICK CHOLANGI 4FR 50CM (CATHETERS) ×2 IMPLANT
CHLORAPREP W/TINT 26ML (MISCELLANEOUS) ×2 IMPLANT
CLIP APPLIE 5 13 M/L LIGAMAX5 (MISCELLANEOUS) ×1 IMPLANT
COVER MAYO STAND STRL (DRAPES) ×2 IMPLANT
COVER SURGICAL LIGHT HANDLE (MISCELLANEOUS) ×2 IMPLANT
DERMABOND ADVANCED (GAUZE/BANDAGES/DRESSINGS) ×1
DERMABOND ADVANCED .7 DNX12 (GAUZE/BANDAGES/DRESSINGS) ×1 IMPLANT
DRAPE C-ARM 42X72 X-RAY (DRAPES) ×2 IMPLANT
ELECT REM PT RETURN 9FT ADLT (ELECTROSURGICAL) ×2
ELECTRODE REM PT RTRN 9FT ADLT (ELECTROSURGICAL) ×1 IMPLANT
GLOVE BIO SURGEON STRL SZ7 (GLOVE) ×2 IMPLANT
GLOVE BIO SURGEON STRL SZ7.5 (GLOVE) ×4 IMPLANT
GOWN STRL REUS W/ TWL LRG LVL3 (GOWN DISPOSABLE) ×4 IMPLANT
GOWN STRL REUS W/TWL LRG LVL3 (GOWN DISPOSABLE) ×4
HEMOSTAT SNOW SURGICEL 2X4 (HEMOSTASIS) ×2 IMPLANT
IV CATH 14GX2 1/4 (CATHETERS) ×2 IMPLANT
KIT BASIN OR (CUSTOM PROCEDURE TRAY) ×2 IMPLANT
KIT ROOM TURNOVER OR (KITS) ×2 IMPLANT
NS IRRIG 1000ML POUR BTL (IV SOLUTION) ×2 IMPLANT
PAD ARMBOARD 7.5X6 YLW CONV (MISCELLANEOUS) ×2 IMPLANT
POUCH SPECIMEN RETRIEVAL 10MM (ENDOMECHANICALS) ×2 IMPLANT
SCISSORS LAP 5X35 DISP (ENDOMECHANICALS) ×2 IMPLANT
SET IRRIG TUBING LAPAROSCOPIC (IRRIGATION / IRRIGATOR) ×2 IMPLANT
SLEEVE ENDOPATH XCEL 5M (ENDOMECHANICALS) ×4 IMPLANT
SPECIMEN JAR SMALL (MISCELLANEOUS) ×2 IMPLANT
SUT MNCRL AB 4-0 PS2 18 (SUTURE) ×2 IMPLANT
TOWEL OR 17X24 6PK STRL BLUE (TOWEL DISPOSABLE) ×2 IMPLANT
TOWEL OR 17X26 10 PK STRL BLUE (TOWEL DISPOSABLE) IMPLANT
TRAY LAPAROSCOPIC MC (CUSTOM PROCEDURE TRAY) ×2 IMPLANT
TROCAR XCEL BLUNT TIP 100MML (ENDOMECHANICALS) ×2 IMPLANT
TROCAR XCEL NON-BLD 5MMX100MML (ENDOMECHANICALS) ×2 IMPLANT
TUBING INSUFFLATION (TUBING) ×2 IMPLANT
WATER STERILE IRR 1000ML POUR (IV SOLUTION) ×2 IMPLANT

## 2017-08-25 NOTE — Anesthesia Preprocedure Evaluation (Signed)
Anesthesia Evaluation  Patient identified by MRN, date of birth, ID band Patient awake    Reviewed: Allergy & Precautions, NPO status , Patient's Chart, lab work & pertinent test results, reviewed documented beta blocker date and time   History of Anesthesia Complications (+) PROLONGED EMERGENCE and history of anesthetic complications  Airway Mallampati: III  TM Distance: >3 FB Neck ROM: Full    Dental  (+) Edentulous Upper   Pulmonary sleep apnea and Continuous Positive Airway Pressure Ventilation ,    breath sounds clear to auscultation       Cardiovascular hypertension, Pt. on medications and Pt. on home beta blockers  Rhythm:Regular     Neuro/Psych  Headaches, PSYCHIATRIC DISORDERS Depression  Neuromuscular disease    GI/Hepatic PUD, GERD  Medicated and Controlled,  Endo/Other  diabetes, Type 2Morbid obesity  Renal/GU Renal InsufficiencyRenal disease     Musculoskeletal  (+) Arthritis ,   Abdominal   Peds  Hematology   Anesthesia Other Findings Pt is a 62 year old male scheduled for laparoscopic cholecystectomy with intraoperative cholangiogram on 08/25/2017 with [name], MD  - PCP is Kathlene November, MD - Endocrinologist is Elayne Snare, MD - Neurologist is Arlice Colt, MD - Nephrologist is Pearson Grippe, MD.  Last office visit 11/20/16; 1 year f/u recommended  PMH includes: HTN, DM, OSA, CKD (stage 3), fatty liver, cirrhosis, post-traumatic hydrocephalus (s/p shunt), GERD.  Never smoker.  BMI 38.  Medications include: Humira, Lipitor, carvedilol, fenofibrate, glimepiride, Keppra, metformin, Prilosec, Zantac, Trulicity  BP 631/49   Pulse 75   Temp 36.5 C   Resp 20   Ht 5' 11"  (1.803 m)   Wt 256 lb 12.8 oz (116.5 kg)   SpO2 98%   BMI 35.82 kg/m   Preoperative labs reviewed.   - Cr 1.67, BUN 22. Cr has ranged 1.16 - 1.75 over past year.  - Glucose 192. HbA1c was 7.3 on 07/15/17  EKG 10/15/16: Sinus  rhythm. Abnormal R-wave progression, early transition. Borderline T abnormalities, lateral leads  Nuclear stress test 07/27/12:  - Normal stress nuclear study. - LV Ejection Fraction: 52%. LV Wall Motion: NL LV Function; NL Wall Motion  Stress echo 06/10/12:  - Stress ECG conclusions: The stress ECG was normal. - Impressions: Difficult acoustic windows. Normal study after maximal exercise.     Reproductive/Obstetrics                             Anesthesia Physical Anesthesia Plan  ASA: III  Anesthesia Plan: General   Post-op Pain Management:    Induction: Intravenous  PONV Risk Score and Plan: 2 and Ondansetron and Dexamethasone  Airway Management Planned: Oral ETT  Additional Equipment: None  Intra-op Plan:   Post-operative Plan: Extubation in OR  Informed Consent: I have reviewed the patients History and Physical, chart, labs and discussed the procedure including the risks, benefits and alternatives for the proposed anesthesia with the patient or authorized representative who has indicated his/her understanding and acceptance.   Dental advisory given  Plan Discussed with: CRNA and Surgeon  Anesthesia Plan Comments:         Anesthesia Quick Evaluation

## 2017-08-25 NOTE — Interval H&P Note (Signed)
History and Physical Interval Note:  08/25/2017 8:24 AM  James [name]  has presented today for surgery, with the diagnosis of GALLSTONES  The various methods of treatment have been discussed with the patient and family. After consideration of risks, benefits and other options for treatment, the patient has consented to  Procedure(s): LAPAROSCOPIC CHOLECYSTECTOMY WITH INTRAOPERATIVE CHOLANGIOGRAM (N/A) as a surgical intervention .  The patient's history has been reviewed, patient examined, no change in status, stable for surgery.  I have reviewed the patient's chart and labs.  Questions were answered to the patient's satisfaction.     TOTH III,PAUL S

## 2017-08-25 NOTE — Anesthesia Postprocedure Evaluation (Signed)
Anesthesia Post Note  Patient: James [name]  Procedure(s) Performed: LAPAROSCOPIC CHOLECYSTECTOMY WITH INTRAOPERATIVE CHOLANGIOGRAM (N/A Abdomen)     Patient location during evaluation: PACU Anesthesia Type: General Level of consciousness: awake and alert Pain management: pain level controlled Vital Signs Assessment: post-procedure vital signs reviewed and stable Respiratory status: spontaneous breathing, nonlabored ventilation, respiratory function stable and patient connected to nasal cannula oxygen Cardiovascular status: blood pressure returned to baseline and stable Postop Assessment: no apparent nausea or vomiting Anesthetic complications: no    Last Vitals:  Vitals:   08/25/17 1200 08/25/17 1443  BP: 113/79 121/72  Pulse:  80  Resp: 13 17  Temp: 36.8 C 36.9 C  SpO2: 94% 94%    Last Pain:  Vitals:   08/25/17 1443  TempSrc: Oral  PainSc:                  [name]

## 2017-08-25 NOTE — Transfer of Care (Signed)
Immediate Anesthesia Transfer of Care Note  Patient: James [name]  Procedure(s) Performed: LAPAROSCOPIC CHOLECYSTECTOMY WITH INTRAOPERATIVE CHOLANGIOGRAM (N/A Abdomen)  Patient Location: PACU  Anesthesia Type:General  Level of Consciousness: awake and alert   Airway & Oxygen Therapy: Patient Spontanous Breathing and Patient connected to nasal cannula oxygen  Post-op Assessment: Report given to RN and Post -op Vital signs reviewed and stable  Post vital signs: Reviewed and stable  Last Vitals:  Vitals:   08/25/17 0635  BP: (!) 151/90  Pulse: 80  Resp: 16  Temp: 36.7 C  SpO2: 98%    Last Pain:  Vitals:   08/25/17 0730  TempSrc:   PainSc: 0-No pain      Patients Stated Pain Goal: 5 (67/01/10 0349)  Complications: No apparent anesthesia complications

## 2017-08-25 NOTE — Anesthesia Procedure Notes (Signed)
Performed by: Imagene Riches, CRNA

## 2017-08-25 NOTE — Anesthesia Procedure Notes (Addendum)
Procedure Name: Intubation Date/Time: 08/25/2017 8:43 AM Performed by: Imagene Riches, CRNA Pre-anesthesia Checklist: Patient identified, Emergency Drugs available, Suction available and Patient being monitored Patient Re-evaluated:Patient Re-evaluated prior to induction Oxygen Delivery Method: Circle System Utilized Preoxygenation: Pre-oxygenation with 100% oxygen Induction Type: IV induction Laryngoscope Size: Miller and 3 Grade View: Grade I Tube type: Oral Tube size: 7.5 mm Number of attempts: 1 Airway Equipment and Method: Stylet and Oral airway Placement Confirmation: ETT inserted through vocal cords under direct vision,  positive ETCO2 and breath sounds checked- equal and bilateral Secured at: 23 cm Tube secured with: Tape Dental Injury: Teeth and Oropharynx as per pre-operative assessment

## 2017-08-25 NOTE — H&P (Signed)
James [name]  Location: [facility] Patient #: [id number] DOB: 06/07/1956 Married / Language: English / Race: White Male   History of Present Illness  The patient is a 61 year old male who presents with abdominal pain. We are asked to see the patient in consultation by Dr. Zehr to evaluate him for gallstones. The patient is a 61-year-old white male who is been experiencing right upper quadrant pain for the last 6 months or so. The pain often time last for a week before it resolves. The pain is been associated with significant nausea but no vomiting. His recent CT scan did show stones in the gallbladder but no gallbladder wall thickening or ductal dilatation. His most recent liver functions were essentially normal. He does have underlying cirrhosis and sleep apnea.   Past Surgical History  Foot Surgery  Left. Hip Surgery  Left. Shoulder Surgery  Left.  Diagnostic Studies History  Colonoscopy  1-5 years ago  Allergies Sulfa Drugs  Morphine Derivatives  Allergies Reconciled   Medication History  Accu-Chek Guide (In Vitro) Active. Atorvastatin Calcium (10MG Tablet, Oral) Active. Carvedilol (12.5MG Tablet, Oral) Active. Cyclobenzaprine HCl (5MG Tablet, Oral) Active. Gabapentin (600MG Tablet, Oral) Active. Glimepiride (1MG Tablet, Oral) Active. LevETIRAcetam (750MG Tablet, Oral) Active. Humira Pen (40MG/0.8ML Pen-inj Kit, Subcutaneous) Active. MetFORMIN HCl (1000MG Tablet, Oral) Active. Omeprazole (40MG Capsule DR, Oral) Active. RaNITidine HCl (150MG Tablet, Oral) Active. Fenofibrate Micronized (134MG Capsule, Oral) Active. Trulicity (1.5MG/0.5ML Soln Pen-inj, Subcutaneous) Active. Medications Reconciled  Social History  Caffeine use  Carbonated beverages, Tea. No alcohol use  No drug use  Tobacco use  Never smoker.  Family History  Cerebrovascular Accident  Father. Respiratory Condition  Father.  Other Problems Arthritis   Back Pain  Cholelithiasis  Cirrhosis Of Liver  Diabetes Mellitus  Diverticulosis  Gastric Ulcer  Gastroesophageal Reflux Disease  High blood pressure  Hypercholesterolemia  Kidney Stone  Migraine Headache  Sleep Apnea  Transfusion history     Review of Systems  General Present- Fatigue. Not Present- Appetite Loss, Chills, Fever, Night Sweats, Weight Gain and Weight Loss. Skin Not Present- Change in Wart/Mole, Dryness, Hives, Jaundice, New Lesions, Non-Healing Wounds, Rash and Ulcer. HEENT Present- Wears glasses/contact lenses. Not Present- Earache, Hearing Loss, Hoarseness, Nose Bleed, Oral Ulcers, Ringing in the Ears, Seasonal Allergies, Sinus Pain, Sore Throat, Visual Disturbances and Yellow Eyes. Respiratory Present- Snoring. Not Present- Bloody sputum, Chronic Cough, Difficulty Breathing and Wheezing. Breast Not Present- Breast Mass, Breast Pain, Nipple Discharge and Skin Changes. Cardiovascular Not Present- Chest Pain, Difficulty Breathing Lying Down, Leg Cramps, Palpitations, Rapid Heart Rate, Shortness of Breath and Swelling of Extremities. Gastrointestinal Present- Abdominal Pain, Change in Bowel Habits, Constipation and Excessive gas. Not Present- Bloating, Bloody Stool, Chronic diarrhea, Difficulty Swallowing, Gets full quickly at meals, Hemorrhoids, Indigestion, Nausea, Rectal Pain and Vomiting. Musculoskeletal Present- Joint Pain. Not Present- Back Pain, Joint Stiffness, Muscle Pain, Muscle Weakness and Swelling of Extremities. Neurological Present- Headaches and Numbness. Not Present- Decreased Memory, Fainting, Seizures, Tingling, Tremor, Trouble walking and Weakness. Psychiatric Not Present- Anxiety, Bipolar, Change in Sleep Pattern, Depression, Fearful and Frequent crying. Endocrine Not Present- Cold Intolerance, Excessive Hunger, Hair Changes, Heat Intolerance, Hot flashes and New Diabetes. Hematology Not Present- Blood Thinners, Easy Bruising, Excessive  bleeding, Gland problems, HIV and Persistent Infections.  Vitals Weight: 260.6 lb Height: 71in Body Surface Area: 2.36 m Body Mass Index: 36.35 kg/m  Temp.: 98.4F  Pulse: 96 (Regular)  BP: 145/90 (Sitting, Left Arm, Standard)         Physical Exam  General Mental Status-Alert. General Appearance-Consistent with stated age. Hydration-Well hydrated. Voice-Normal.  Head and Neck Head-normocephalic, atraumatic with no lesions or palpable masses. Trachea-midline. Thyroid Gland Characteristics - normal size and consistency.  Eye Eyeball - Bilateral-Extraocular movements intact. Sclera/Conjunctiva - Bilateral-No scleral icterus.  Chest and Lung Exam Chest and lung exam reveals -quiet, even and easy respiratory effort with no use of accessory muscles and on auscultation, normal breath sounds, no adventitious sounds and normal vocal resonance. Inspection Chest Wall - Normal. Back - normal.  Cardiovascular Cardiovascular examination reveals -normal heart sounds, regular rate and rhythm with no murmurs and normal pedal pulses bilaterally.  Abdomen Note: There is moderate right upper quadrant tenderness but no guarding or peritonitis. There is no palpable mass.   Neurologic Neurologic evaluation reveals -alert and oriented x 3 with no impairment of recent or remote memory. Mental Status-Normal.  Musculoskeletal Normal Exam - Left-Upper Extremity Strength Normal and Lower Extremity Strength Normal. Normal Exam - Right-Upper Extremity Strength Normal and Lower Extremity Strength Normal.  Lymphatic Head & Neck  General Head & Neck Lymphatics: Bilateral - Description - Normal. Axillary  General Axillary Region: Bilateral - Description - Normal. Tenderness - Non Tender. Femoral & Inguinal  Generalized Femoral & Inguinal Lymphatics: Bilateral - Description - Normal. Tenderness - Non Tender.    Assessment & Plan  GALLSTONES  (K80.20) Impression: The patient appears to have symptomatic gallstones. Because of the risk of further painful episodes and possible pancreatitis or think he would benefit from having his gallbladder removed. He would also like to have this done. I have discussed with him in detail the risks and benefits of the operation as well as some of the technical aspects and he understands and wishes to proceed. He realizes that he is at higher risk because of his underlying cirrhosis and his liver functions have been normal. He also has a VP shunt that we will try to avoid. I will plan for a laparoscopic cholecystectomy with intraoperative cholangiogram     

## 2017-08-25 NOTE — Op Note (Signed)
08/25/2017  9:47 AM  PATIENT:  James [name]  62 y.o. male  PRE-OPERATIVE DIAGNOSIS:  GALLSTONES  POST-OPERATIVE DIAGNOSIS:  GALLSTONES  PROCEDURE:  Procedure(s): LAPAROSCOPIC CHOLECYSTECTOMY WITH INTRAOPERATIVE CHOLANGIOGRAM (N/A)  SURGEON:  Surgeon(s) and Role:    * [name], MD - Primary    * Donnie Mesa, MD - Assisting  PHYSICIAN ASSISTANT:   ASSISTANTS: Dr. Georgette Dover   ANESTHESIA:   local and general  EBL:  minimal   BLOOD ADMINISTERED:none  DRAINS: none   LOCAL MEDICATIONS USED:  MARCAINE     SPECIMEN:  Source of Specimen:  gallbladder  DISPOSITION OF SPECIMEN:  PATHOLOGY  COUNTS:  YES  TOURNIQUET:  * No tourniquets in log *  DICTATION: .Dragon Dictation   Procedure: After informed consent was obtained the patient was brought to the operating room and placed in the supine position on the operating room table. After adequate induction of general anesthesia the patient's abdomen was prepped with ChloraPrep allowed to dry and draped in usual sterile manner. An appropriate timeout was performed. The area below the umbilicus was infiltrated with quarter percent  Marcaine. A small incision was made with a 15 blade knife. The incision was carried down through the subcutaneous tissue bluntly with a hemostat and [employer] retractors. The linea alba was identified. The linea alba was incised with a 15 blade knife and each side was grasped with Coker clamps. The preperitoneal space was then probed with a hemostat until the peritoneum was opened and access was gained to the abdominal cavity. A 0 Vicryl pursestring stitch was placed in the fascia surrounding the opening. A Hassan cannula was then placed through the opening and anchored in place with the previously placed Vicryl purse string stitch. The abdomen was insufflated with carbon dioxide without difficulty. A laparoscope was inserted through the [facility] cannula in the right upper quadrant was inspected. The liver had a  nodular cirrhotic appearance.  Next the epigastric region was infiltrated with % Marcaine. A small incision was made with a 15 blade knife. A 5 mm port was placed bluntly through this incision into the abdominal cavity under direct vision. Next 2 sites were chosen laterally on the right side of the abdomen for placement of 5 mm ports. Each of these areas was infiltrated with quarter percent Marcaine. Small stab incisions were made with a 15 blade knife. 5 mm ports were then placed bluntly through these incisions into the abdominal cavity under direct vision without difficulty. A blunt grasper was placed through the lateralmost 5 mm port and used to grasp the dome of the gallbladder and elevated anteriorly and superiorly. Another blunt grasper was placed through the other 5 mm port and used to retract the body and neck of the gallbladder. There were some omental adhesions to the body of the gallbladder that were taken down by blunt and sharp dissection.  A dissector was placed through the epigastric port and using the electrocautery the peritoneal reflection at the gallbladder neck was opened. Blunt dissection was then carried out in this area until the gallbladder neck-cystic duct junction was readily identified and a good window was created. A single clip was placed on the gallbladder neck. A small  ductotomy was made just below the clip with laparoscopic scissors. A 14-gauge Angiocath was then placed through the anterior abdominal wall under direct vision. A Reddick cholangiogram catheter was then placed through the Angiocath and flushed. The catheter was then placed in the cystic duct and anchored in place with  a clip. A cholangiogram was obtained that showed no filling defects good emptying into the duodenum an adequate length on the cystic duct. The anchoring clip and catheters were then removed from the patient. 3 clips were placed proximally on the cystic duct and the duct was divided between the 2 sets of  clips. Posterior to this the cystic artery was identified and again dissected bluntly in a circumferential manner until a good window  was created. 2 clips were placed proximally and one distally on the artery and the artery was divided between the 2 sets of clips. Next a laparoscopic hook cautery device was used to separate the gallbladder from the liver bed. Prior to completely detaching the gallbladder from the liver bed the liver bed was inspected and several small bleeding points were coagulated with the electrocautery until the area was completely hemostatic. The gallbladder was then detached the rest of it from the liver bed without difficulty. A laparoscopic bag was inserted through the hassan port. The laparoscope was moved to the epigastric port. The gallbladder was placed within the bag and the bag was sealed.  The bag with the gallbladder was then removed with the [facility] cannula through the infraumbilical port without difficulty. The fascial defect was then closed with the previously placed Vicryl pursestring stitch as well as with another figure-of-eight 0 Vicryl stitch. The liver bed was inspected again and found to be hemostatic. A piece of surgicel snow was placed in the liver bed. The abdomen was irrigated with copious amounts of saline until the effluent was clear. The ports were then removed under direct vision without difficulty and were found to be hemostatic. The gas was allowed to escape. The skin incisions were all closed with interrupted 4-0 Monocryl subcuticular stitches. Dermabond dressings were applied. The patient tolerated the procedure well. At the end of the case all needle sponge and instrument counts were correct. The patient was then awakened and taken to recovery in stable condition  PLAN OF CARE: Admit for overnight observation  PATIENT DISPOSITION:  PACU - hemodynamically stable.   Delay start of Pharmacological VTE agent (>24hrs) due to surgical blood loss or risk of  bleeding: yes

## 2017-08-26 ENCOUNTER — Telehealth: Payer: Self-pay | Admitting: Endocrinology

## 2017-08-26 ENCOUNTER — Other Ambulatory Visit (INDEPENDENT_AMBULATORY_CARE_PROVIDER_SITE_OTHER): Payer: 59

## 2017-08-26 ENCOUNTER — Encounter (HOSPITAL_COMMUNITY): Payer: Self-pay | Admitting: General Surgery

## 2017-08-26 DIAGNOSIS — K219 Gastro-esophageal reflux disease without esophagitis: Secondary | ICD-10-CM | POA: Diagnosis not present

## 2017-08-26 DIAGNOSIS — E1165 Type 2 diabetes mellitus with hyperglycemia: Secondary | ICD-10-CM

## 2017-08-26 DIAGNOSIS — E119 Type 2 diabetes mellitus without complications: Secondary | ICD-10-CM | POA: Diagnosis not present

## 2017-08-26 DIAGNOSIS — E7800 Pure hypercholesterolemia, unspecified: Secondary | ICD-10-CM | POA: Diagnosis not present

## 2017-08-26 DIAGNOSIS — Z79899 Other long term (current) drug therapy: Secondary | ICD-10-CM | POA: Diagnosis not present

## 2017-08-26 DIAGNOSIS — K7460 Unspecified cirrhosis of liver: Secondary | ICD-10-CM | POA: Diagnosis not present

## 2017-08-26 DIAGNOSIS — Z7984 Long term (current) use of oral hypoglycemic drugs: Secondary | ICD-10-CM | POA: Diagnosis not present

## 2017-08-26 DIAGNOSIS — I10 Essential (primary) hypertension: Secondary | ICD-10-CM | POA: Diagnosis not present

## 2017-08-26 DIAGNOSIS — K8010 Calculus of gallbladder with chronic cholecystitis without obstruction: Secondary | ICD-10-CM | POA: Diagnosis not present

## 2017-08-26 DIAGNOSIS — G4730 Sleep apnea, unspecified: Secondary | ICD-10-CM | POA: Diagnosis not present

## 2017-08-26 LAB — BASIC METABOLIC PANEL
BUN: 21 mg/dL (ref 6–23)
CALCIUM: 9.6 mg/dL (ref 8.4–10.5)
CO2: 25 mEq/L (ref 19–32)
CREATININE: 1.6 mg/dL — AB (ref 0.40–1.50)
Chloride: 101 mEq/L (ref 96–112)
GFR: 47.18 mL/min — AB (ref >60.00)
GLUCOSE: 283 mg/dL — AB (ref 70–99)
Potassium: 4.8 mEq/L (ref 3.5–5.1)
SODIUM: 135 meq/L (ref 135–145)

## 2017-08-26 LAB — GLUCOSE, CAPILLARY
GLUCOSE-CAPILLARY: 286 mg/dL — AB (ref 65–99)
Glucose-Capillary: 225 mg/dL — ABNORMAL HIGH (ref 65–99)
Glucose-Capillary: 270 mg/dL — ABNORMAL HIGH (ref 65–99)

## 2017-08-26 MED ORDER — OXYCODONE-ACETAMINOPHEN 5-325 MG PO TABS: 1.0000 | ORAL_TABLET | Freq: Four times a day (QID) | ORAL | 0 refills | Status: DC | PRN

## 2017-08-26 NOTE — Telephone Encounter (Signed)
Patient came in today to let dr know that patient had surgery and since then his blood sugar has been very high.  Yesterday was up to 430 This morning it was 271 Dr [name] instructed patient to not take the metformin in order to have the surgery since the patients enzymes were high  Patient would like some advice on what he should be doing   Please advise   Please call  [phone]

## 2017-08-26 NOTE — Progress Notes (Signed)
Pts' CBG at 2115 is 430. Dr Hulen Skains notified w/ an order to give 20units of novolog sq and recheck Cbg after 1hr and notify Md if it's >350. Cbg 1hr after giving insulin was 366. Md said to rechecked it again after another hour and if it's above 300 to give another 15units of novolog.

## 2017-08-26 NOTE — Discharge Summary (Signed)
Physician Discharge Summary  Patient ID: James [name] MRN: [id number] DOB/AGE: 60-Jan-1957 60 y.o.  Admit date: 08/25/2017 Discharge date: 08/26/2017  Admission Diagnoses:  Discharge Diagnoses:  Active Problems:   Gallstones   Discharged Condition: good  Hospital Course: the patient underwent lap chole. He tolerated surgery well. Because of his sleep apnea and cirrhosis he stayed overnight. His course was unremarkable. On pod 1 he was ready for discharge home  Consults: None  Significant Diagnostic Studies: none  Treatments: surgery: as above  Discharge Exam: Blood pressure 124/70, pulse 65, temperature 97.8 F (36.6 C), temperature source Oral, resp. rate 14, height 5' 11"  (1.803 m), weight 120 kg (264 lb 8.8 oz), SpO2 95 %. General appearance: alert and cooperative Resp: clear to auscultation bilaterally Cardio: regular rate and rhythm GI: soft, minimal tenderness  Disposition: 01-Home or Self Care  Discharge Instructions    Call MD for:  difficulty breathing, headache or visual disturbances   Complete by:  As directed    Call MD for:  extreme fatigue   Complete by:  As directed    Call MD for:  hives   Complete by:  As directed    Call MD for:  persistant dizziness or light-headedness   Complete by:  As directed    Call MD for:  persistant nausea and vomiting   Complete by:  As directed    Call MD for:  redness, tenderness, or signs of infection (pain, swelling, redness, odor or green/yellow discharge around incision site)   Complete by:  As directed    Call MD for:  severe uncontrolled pain   Complete by:  As directed    Call MD for:  temperature >100.4   Complete by:  As directed    Diet - low sodium heart healthy   Complete by:  As directed    Discharge instructions   Complete by:  As directed    May shower. Low fat diet. No heavy lifting   Increase activity slowly   Complete by:  As directed    No wound care   Complete by:  As directed       Allergies as of 08/26/2017      Reactions   Hydrocodone-homatropine Other (See Comments)   Depressed feeling   Morphine And Related Other (See Comments)   Hallucinations, back in the 80s. States has taken vicodin before w/o problems    Sulfa Drugs Cross Reactors Rash      Medication List    TAKE these medications   Adalimumab 40 MG/0.8ML Pnkt Commonly known as:  HUMIRA PEN Inject 0.8 mLs into the skin every 14 (fourteen) days.   atorvastatin 10 MG tablet Commonly known as:  LIPITOR TAKE 1 TABLET (10 MG TOTAL) BY MOUTH DAILY.   carvedilol 12.5 MG tablet Commonly known as:  COREG Take 1 tablet (12.5 mg total) by mouth 2 (two) times daily with a meal.   clonazePAM 0.5 MG tablet Commonly known as:  KLONOPIN Take 1 tablet (0.5 mg total) by mouth at bedtime. Fax to [facility]  [phone]   DULoxetine 60 MG capsule Commonly known as:  CYMBALTA Take 2 capsules (120 mg total) by mouth daily. What changed:    how much to take  when to take this   fenofibrate micronized 134 MG capsule Commonly known as:  LOFIBRA TAKE 1 CAPSULE (134 MG TOTAL) BY MOUTH DAILY BEFORE BREAKFAST.   FREESTYLE FREEDOM LITE w/Device Kit Use to check blood sugars 3 times daily. Dx code:  E11.9   freestyle lancets Use to check blood sugar 3 times daily. Dx code E11.9   gabapentin 600 MG tablet Commonly known as:  NEURONTIN Take 1 tablet (600 mg total) by mouth 3 (three) times daily.   glimepiride 1 MG tablet Commonly known as:  AMARYL Take 1 tablet (1 mg total) by mouth daily before supper.   glucose blood test strip Commonly known as:  ACCU-CHEK GUIDE Use to test blood sugar three times daily   levETIRAcetam 750 MG tablet Commonly known as:  KEPPRA Take 1 tablet (750 mg total) by mouth 2 (two) times daily.   metFORMIN 1000 MG tablet Commonly known as:  GLUCOPHAGE TAKE 1 TABLET BY MOUTH TWICE DAILY WITH A MEAL   naproxen sodium 220 MG tablet Commonly known as:  ALEVE Take 440 mg by  mouth daily as needed (PAIN).   omeprazole 40 MG capsule Commonly known as:  PRILOSEC Take 1 capsule (40 mg total) by mouth 2 (two) times daily before a meal.   oxyCODONE-acetaminophen 5-325 MG tablet Commonly known as:  PERCOCET/ROXICET Take 1-2 tablets by mouth every 6 (six) hours as needed for moderate pain.   TRULICITY 1.5 JQ/9.6KR Sopn Generic drug:  Dulaglutide INJECT THE CONTENTS OF ONE PEN ONCE PER WEEK      Follow-up Information    Autumn Messing III, MD Follow up in 2 week(s).   Specialty:  General Surgery Contact information: [address] [place] [address] [phone]           Signed: Merrie Roof 08/26/2017, 8:30 AM

## 2017-08-26 NOTE — Telephone Encounter (Signed)
Gave advice to the patients wife from the note below and requested she call us tomorrow if his blood sugar is still high tomorrow- she stated an understanding

## 2017-08-26 NOTE — Progress Notes (Signed)
1 Day Post-Op   Subjective/Chief Complaint: No complaints   Objective: Vital signs in last 24 hours: Temp:  [97.2 F (36.2 C)-98.5 F (36.9 C)] 97.8 F (36.6 C) (02/28 0700) Pulse Rate:  [65-97] 65 (02/28 0700) Resp:  [12-22] 14 (02/28 0700) BP: (108-144)/(70-85) 124/70 (02/28 0700) SpO2:  [92 %-97 %] 95 % (02/28 0700) Weight:  [120 kg (264 lb 8.8 oz)] 120 kg (264 lb 8.8 oz) (02/27 1200) Last BM Date: 08/24/17  Intake/Output from previous day: 02/27 0701 - 02/28 0700 In: 2925 [P.O.:1320; I.V.:1530] Out: 725 [Urine:700; Blood:25] Intake/Output this shift: No intake/output data recorded.  General appearance: alert and cooperative Resp: clear to auscultation bilaterally Cardio: regular rate and rhythm GI: soft, minimal tenderness  Lab Results:  No results for input(s): WBC, HGB, HCT, PLT in the last 72 hours. BMET No results for input(s): NA, K, CL, CO2, GLUCOSE, BUN, CREATININE, CALCIUM in the last 72 hours. PT/INR No results for input(s): LABPROT, INR in the last 72 hours. ABG No results for input(s): PHART, HCO3 in the last 72 hours.  Invalid input(s): PCO2, PO2  Studies/Results: Dg Cholangiogram Operative  Result Date: 08/25/2017 CLINICAL DATA:  Gallstones EXAM: INTRAOPERATIVE CHOLANGIOGRAM TECHNIQUE: Cholangiographic images from the C-arm fluoroscopic device were submitted for interpretation post-operatively. Please see the procedural report for the amount of contrast and the fluoroscopy time utilized. COMPARISON:  None. FINDINGS: Contrast fills the biliary tree and duodenum without filling defects in the common bile duct. There is web-like narrowing in the distal common bile duct. IMPRESSION: No common bile duct stones. There is web-like narrowing in the distal common bile duct. Electronically Signed   By: Marybelle Killings M.D.   On: 08/25/2017 10:52    Anti-infectives: Anti-infectives (From admission, onward)   Start     Dose/Rate Route Frequency Ordered Stop   08/25/17 0655  ceFAZolin (ANCEF) IVPB 2g/100 mL premix     2 g 200 mL/hr over 30 Minutes Intravenous On call to O.R. 08/25/17 8882 08/25/17 0851      Assessment/Plan: s/p Procedure(s): LAPAROSCOPIC CHOLECYSTECTOMY WITH INTRAOPERATIVE CHOLANGIOGRAM (N/A) Advance diet Discharge  LOS: 0 days    TOTH III,PAUL S 08/26/2017

## 2017-08-26 NOTE — Telephone Encounter (Signed)
His kidney test is pending from today but can still take 1 metformin tablet daily in the evening.  Also while blood sugars are higher he will take 3 tablets of glimepiride daily instead of 1.  He will need to call us if his blood sugar is still high tomorrow to start insulin

## 2017-08-27 ENCOUNTER — Other Ambulatory Visit: Payer: 59

## 2017-08-27 LAB — FRUCTOSAMINE: FRUCTOSAMINE: 323 umol/L — AB (ref 0–285)

## 2017-08-29 NOTE — Progress Notes (Signed)
Patient ID: James [name], male   DOB: June 26, 1956, 62 y.o.   MRN: [id number]           Reason for Appointment: Follow-up for Type 2 Diabetes  Referring physician: [name]  History of Present Illness:          Date of diagnosis of type 2 diabetes mellitus:  ?  2014      Background history:  He is not clear how his diabetes was diagnosed, likely on routine testing. Initially had been treated with metformin and also tried on Amaryl Patient had progressive increase in his blood sugars since 1/17 with stopping metformin and being on the regimen of Amaryl and Januvia, he thinks his blood sugars went up to 601.  He was then started on basal bolus insulin   Recent history:   Non-insulin hypoglycemic drugs the patient is taking are: Trulicity 1.5 mg weekly, Metformin 1 g twice a day  His A1c is generally under 7% and now is 7.3 compared to 6.2  Current management, blood sugar patterns and problems identified:  He has had higher sugars in the evenings more recently especially after his surgery and leaving off metformin for a couple of days  Has not had any steroids  However he was started on additional 2 mg of Amaryl during the day when he called recently  He has taken this but his blood sugars are still mostly high after supper, has not checked in a couple of days  Again checking blood sugar very sporadically at home  His main meal is in the evening and he is still drinking glass of sweet tea although he has cut back on juices  Still very inactive  His fasting blood sugars are however fairly close to normal and blood sugars are variably high in the afternoon   Side  effects from medications have been: None  Compliance with the medical regimen: Fair  Glucose monitoring:  done <1 times a day         Glucometer:  Accu-Chek Blood Glucose readings by download  Mean values apply above for all meters except median for One Touch  PRE-MEAL Fasting Lunch Dinner Bedtime Overall  Glucose  range:  89-136  100  118-201  112-359   Mean/median:      167    Self-care: The diet that the patient has been following is:   reducing fried food   Meal times are:  Breakfast is not always eaten.  Lunch: 2-3 PM Dinner: 5-6 PM          Dietician visit, most recent:09/2015               Exercise: none recently  Weight history:  Wt Readings from Last 3 Encounters:  08/30/17 260 lb 6.4 oz (118.1 kg)  08/25/17 264 lb 8.8 oz (120 kg)  08/24/17 257 lb (116.6 kg)    Glycemic control:   Lab Results  Component Value Date   HGBA1C 7.3 (H) 07/15/2017   HGBA1C 6.2 03/17/2017   HGBA1C 6.5 12/07/2016   Lab Results  Component Value Date   MICROALBUR 1.9 07/15/2017   LDLCALC 137 (H) 09/07/2016   CREATININE 1.60 (H) 08/26/2017    Admission on 08/25/2017, Discharged on 08/26/2017  Component Date Value Ref Range Status  . Glucose-Capillary 08/25/2017 232* 65 - 99 mg/dL Final  . Comment 1 08/25/2017 Notify RN   Final  . Comment 2 08/25/2017 Document in Chart   Final  . Glucose-Capillary 08/25/2017 137* 65 -  99 mg/dL Final  . Glucose-Capillary 08/25/2017 296* 65 - 99 mg/dL Final  . Glucose-Capillary 08/25/2017 430* 65 - 99 mg/dL Final  . Glucose-Capillary 08/25/2017 366* 65 - 99 mg/dL Final  . Glucose-Capillary 08/25/2017 315* 65 - 99 mg/dL Final  . Glucose-Capillary 08/26/2017 286* 65 - 99 mg/dL Final  . Glucose-Capillary 08/26/2017 225* 65 - 99 mg/dL Final  . Glucose-Capillary 08/26/2017 270* 65 - 99 mg/dL Final  Lab on 08/26/2017  Component Date Value Ref Range Status  . Sodium 08/26/2017 135  135 - 145 mEq/L Final  . Potassium 08/26/2017 4.8  3.5 - 5.1 mEq/L Final  . Chloride 08/26/2017 101  96 - 112 mEq/L Final  . CO2 08/26/2017 25  19 - 32 mEq/L Final  . Glucose, Bld 08/26/2017 283* 70 - 99 mg/dL Final  . BUN 08/26/2017 21  6 - 23 mg/dL Final  . Creatinine, Ser 08/26/2017 1.60* 0.40 - 1.50 mg/dL Final  . Calcium 08/26/2017 9.6  8.4 - 10.5 mg/dL Final  . GFR 08/26/2017 47.18*  >60.00 mL/min Final  . Fructosamine 08/26/2017 323* 0 - 285 umol/L Final   Comment: Published reference interval for apparently healthy subjects between age 89 and 74 is 56 - 285 umol/L and in a poorly controlled diabetic population is 228 - 563 umol/L with a mean of 396 umol/L.   Appointment on 08/24/2017  Component Date Value Ref Range Status  . Weight 08/24/2017 4,112  oz Final  . Height 08/24/2017 69.000  in Final  Appointment on 08/24/2017  Component Date Value Ref Range Status  . SSS 08/24/2017 7   Final  . [facility] 08/24/2017 7   Final  . SDS 08/24/2017 0   Final  . LHR 08/24/2017 0.30   Final  . TID 08/24/2017 1.05   Final  . LV sys vol 08/24/2017 53  mL Final  . LV dias vol 08/24/2017 114  62 - 150 mL Final       Allergies as of 08/30/2017      Reactions   Hydrocodone-homatropine Other (See Comments)   Depressed feeling   Morphine And Related Other (See Comments)   Hallucinations, back in the 80s. States has taken vicodin before w/o problems    Sulfa Drugs Cross Reactors Rash      Medication List        Accurate as of 08/30/17  1:14 PM. Always use your most recent med list.          Adalimumab 40 MG/0.8ML Pnkt Commonly known as:  HUMIRA PEN Inject 0.8 mLs into the skin every 14 (fourteen) days.   atorvastatin 10 MG tablet Commonly known as:  LIPITOR TAKE 1 TABLET (10 MG TOTAL) BY MOUTH DAILY.   carvedilol 12.5 MG tablet Commonly known as:  COREG Take 1 tablet (12.5 mg total) by mouth 2 (two) times daily with a meal.   clonazePAM 0.5 MG tablet Commonly known as:  KLONOPIN Take 1 tablet (0.5 mg total) by mouth at bedtime. Fax to [facility]  [phone]   DULoxetine 60 MG capsule Commonly known as:  CYMBALTA Take 2 capsules (120 mg total) by mouth daily.   fenofibrate micronized 134 MG capsule Commonly known as:  LOFIBRA TAKE 1 CAPSULE (134 MG TOTAL) BY MOUTH DAILY BEFORE BREAKFAST.   FREESTYLE FREEDOM LITE w/Device Kit Use to check blood sugars 3  times daily. Dx code: E11.9   freestyle lancets Use to check blood sugar 3 times daily. Dx code E11.9   gabapentin 600 MG tablet Commonly  known as:  NEURONTIN Take 1 tablet (600 mg total) by mouth 3 (three) times daily.   glimepiride 1 MG tablet Commonly known as:  AMARYL Take 1 tablet (1 mg total) by mouth daily before supper.   glucose blood test strip Commonly known as:  ACCU-CHEK GUIDE Use to test blood sugar three times daily   levETIRAcetam 750 MG tablet Commonly known as:  KEPPRA Take 1 tablet (750 mg total) by mouth 2 (two) times daily.   metFORMIN 1000 MG tablet Commonly known as:  GLUCOPHAGE TAKE 1 TABLET BY MOUTH TWICE DAILY WITH A MEAL   omeprazole 40 MG capsule Commonly known as:  PRILOSEC Take 1 capsule (40 mg total) by mouth 2 (two) times daily before a meal.   oxyCODONE-acetaminophen 5-325 MG tablet Commonly known as:  PERCOCET/ROXICET Take 1-2 tablets by mouth every 6 (six) hours as needed for moderate pain.   TRULICITY 1.5 WU/9.8JX Sopn Generic drug:  Dulaglutide INJECT THE CONTENTS OF ONE PEN ONCE PER WEEK       Allergies:  Allergies  Allergen Reactions  . Hydrocodone-Homatropine Other (See Comments)    Depressed feeling  . Morphine And Related Other (See Comments)    Hallucinations, back in the 80s. States has taken vicodin before w/o problems   . Sulfa Drugs Cross Reactors Rash    Past Medical History:  Diagnosis Date  . Acid reflux   . Arthritis   . Cholelithiasis   . Chronic headaches    on cymbalta  . Cirrhosis ([place])   . Colon polyps   . Complication of anesthesia    problems waking up from anesthesia  . Depression    on cymbalta  . Diabetes mellitus with neuropathy ([place])    borderline per primary MD  . Diverticulitis 03/2013  . Eczema   . Elevated LFTs   . Fatty liver   . History of kidney stones   . Hypertension   . Insomnia 04/26/2013  . Kidney stone   . Morbid obesity ([place])   . Neuromuscular disorder ([facility])     neuropathy  . Neuropathy   . Post-traumatic hydrocephalus    s/p shunts x 2 (first got infected )  . Psoriasis    sees Dr [name]  . Psoriatic arthritis ([place])   . 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 Sleep apnea    no CPAP     . Stomach ulcer   . Testosterone deficiency 04/28/2011    Past Surgical History:  Procedure Laterality Date  . BACK SURGERY  1980  . CHOLECYSTECTOMY  08/25/2017   laproscopic   . CHOLECYSTECTOMY N/A 08/25/2017   Procedure: LAPAROSCOPIC CHOLECYSTECTOMY WITH INTRAOPERATIVE CHOLANGIOGRAM;  Surgeon: [name], MD;  Location: [facility];  Service: General;  Laterality: N/A;  . SHOULDER SURGERY Left 2010  . TOE SURGERY Left 2018  . TOTAL HIP ARTHROPLASTY Left 2011  . VENTRICULOPERITONEAL SHUNT  2007   x2    Family History  Problem Relation Age of Onset  . Healthy Mother   . Lung cancer Father        alive, former smoker   . Heart disease Brother        MI age 77  . Other Brother        Murdered  . Down syndrome Son   . Diabetes Neg Hx   . Prostate cancer Neg Hx   . Colon cancer Neg Hx     Social History:  reports that  has never smoked. he has never used smokeless tobacco. He reports that he does not drink alcohol or use drugs.    Review of Systems     HYPERTENSION: Blood pressure    Followed by PCP   BP Readings from Last 3 Encounters:  08/30/17 124/80  08/26/17 124/70  08/18/17 130/90    Lipid history: He is on Lipitor  with fair control of LDL and is followed by PCP Is now on fenofibrate for triglyceride treatment, has increased triglycerides again     Lab Results  Component Value Date   CHOL 164 07/15/2017   HDL 39.40 07/15/2017   LDLCALC 137 (H) 09/07/2016   LDLDIRECT 101.0 07/15/2017   TRIG 212.0 (H) 07/15/2017   CHOLHDL 4 07/15/2017            Most recent eye exam was  2017  Most recent foot exam: 08/2017  He has symptomatic neuropathy and is taking gabapentin twice a day and   Cymbalta  Review of Systems    Physical Examination:  BP 124/80 (BP Location: Left Arm, Patient Position: Sitting, Cuff Size: Large)   Pulse 68   Ht 5' 11"  (1.803 m)   Wt 260 lb 6.4 oz (118.1 kg)   SpO2 95%   BMI 36.32 kg/m   Diabetic Foot Exam - Simple   Simple Foot Form Diabetic Foot exam was performed with the following findings:  Yes   Visual Inspection No deformities, no ulcerations, no other skin breakdown bilaterally:  Yes Sensation Testing Pulse Check Posterior Tibialis and Dorsalis pulse intact bilaterally:  Yes See comments:  Yes Comments Monofilament sensation absent in his toes except for some sensation in his left first toe Pedal pulses decreased on left and barely palpable        ASSESSMENT:  Diabetes type 2, uncontrolled not on insulin  See history of present illness for discussion of current diabetes management, blood sugar patterns and problems identified  He has postprandial hyperglycemia and getting some benefit from Amaryl However not losing any of his weight because of inactivity and inconsistent diet Most of his hypoglycemia is related to drinking sweet tea at dinnertime Also his metformin dose is limited by his renal dysfunction and cirrhosis  Currently not taking enough readings especially after meals and encouraged him to do so  Neuropathy with sensory loss: Advised him to check his feet daily  RENAL dysfunction: Etiology is unclear but for the time being will stop his fenofibrate and reassess dosage adjustment on his follow-up visit   PLAN:    Cut down intake of sweet tea by half at least  Consultation with dietitian to help with weight loss  Reduce metformin to 1500 mg a day with half a tablet in the evening  Take Amaryl 1 twice a day instead of 3  Stop fenofibrate Do not take any more naproxen in the future Would recommend that he discuss nephrology consultation with his PCP   Patient Instructions  Stop Fenofbrate   Take  only 1/2 metformin at dinner  Cut sweet tea in 1/2  Glimeperide 1 in am and 1 at dinner  Need to see eye Dr.   Elayne Snare 08/30/2017, 1:14 PM   Note: This office note was prepared with Dragon voice recognition system technology. Any transcriptional errors that result from this process are unintentional.

## 2017-08-30 ENCOUNTER — Encounter: Payer: Self-pay | Admitting: Endocrinology

## 2017-08-30 ENCOUNTER — Ambulatory Visit: Payer: 59 | Admitting: Neurology

## 2017-08-30 ENCOUNTER — Ambulatory Visit (INDEPENDENT_AMBULATORY_CARE_PROVIDER_SITE_OTHER): Payer: 59 | Admitting: Endocrinology

## 2017-08-30 VITALS — BP 124/80 | HR 68 | Ht 71.0 in | Wt 260.4 lb

## 2017-08-30 DIAGNOSIS — E1165 Type 2 diabetes mellitus with hyperglycemia: Secondary | ICD-10-CM

## 2017-08-30 NOTE — Patient Instructions (Addendum)
Stop Fenofbrate   Take only 1/2 metformin at dinner  Cut sweet tea in 1/2  Glimeperide 1 in am and 1 at dinner  Need to see eye Dr.

## 2017-09-02 ENCOUNTER — Telehealth: Payer: Self-pay

## 2017-09-02 NOTE — Telephone Encounter (Signed)
Per Dr. Lajuan Lines for Pt to restart metformin- post-op BMP looks good. Pt's wife- [name] informed.

## 2017-09-03 ENCOUNTER — Emergency Department (HOSPITAL_COMMUNITY): Payer: 59

## 2017-09-03 ENCOUNTER — Other Ambulatory Visit: Payer: Self-pay

## 2017-09-03 ENCOUNTER — Emergency Department (HOSPITAL_COMMUNITY)
Admission: EM | Admit: 2017-09-03 | Discharge: 2017-09-03 | Disposition: A | Discharge status: Home / Self Care | Payer: 59 | Attending: Emergency Medicine | Admitting: Emergency Medicine

## 2017-09-03 ENCOUNTER — Encounter (HOSPITAL_COMMUNITY): Payer: Self-pay | Admitting: Emergency Medicine

## 2017-09-03 DIAGNOSIS — I10 Essential (primary) hypertension: Secondary | ICD-10-CM | POA: Insufficient documentation

## 2017-09-03 DIAGNOSIS — R1031 Right lower quadrant pain: Secondary | ICD-10-CM | POA: Diagnosis not present

## 2017-09-03 DIAGNOSIS — E1140 Type 2 diabetes mellitus with diabetic neuropathy, unspecified: Secondary | ICD-10-CM | POA: Insufficient documentation

## 2017-09-03 DIAGNOSIS — Z7984 Long term (current) use of oral hypoglycemic drugs: Secondary | ICD-10-CM | POA: Diagnosis not present

## 2017-09-03 DIAGNOSIS — R110 Nausea: Secondary | ICD-10-CM | POA: Diagnosis not present

## 2017-09-03 DIAGNOSIS — G8918 Other acute postprocedural pain: Secondary | ICD-10-CM | POA: Diagnosis not present

## 2017-09-03 DIAGNOSIS — R109 Unspecified abdominal pain: Secondary | ICD-10-CM | POA: Diagnosis not present

## 2017-09-03 LAB — CBC
HCT: 41.9 % (ref 39.0–52.0)
Hemoglobin: 14.2 g/dL (ref 13.0–17.0)
MCH: 30.5 pg (ref 26.0–34.0)
MCHC: 33.9 g/dL (ref 30.0–36.0)
MCV: 90.1 fL (ref 78.0–100.0)
PLATELETS: 164 10*3/uL (ref 150–400)
RBC: 4.65 MIL/uL (ref 4.22–5.81)
RDW: 15.1 % (ref 11.5–15.5)
WBC: 6.9 10*3/uL (ref 4.0–10.5)

## 2017-09-03 LAB — URINALYSIS, ROUTINE W REFLEX MICROSCOPIC
BILIRUBIN URINE: NEGATIVE
Bacteria, UA: NONE SEEN
GLUCOSE, UA: NEGATIVE mg/dL
Hgb urine dipstick: NEGATIVE
Ketones, ur: NEGATIVE mg/dL
Leukocytes, UA: NEGATIVE
NITRITE: NEGATIVE
PH: 5 (ref 5.0–8.0)
Protein, ur: 30 mg/dL — AB
Specific Gravity, Urine: 1.024 (ref 1.005–1.030)

## 2017-09-03 LAB — COMPREHENSIVE METABOLIC PANEL
ALT: 43 U/L (ref 17–63)
AST: 35 U/L (ref 15–41)
Albumin: 3.8 g/dL (ref 3.5–5.0)
Alkaline Phosphatase: 71 U/L (ref 38–126)
Anion gap: 11 (ref 5–15)
BILIRUBIN TOTAL: 1.2 mg/dL (ref 0.3–1.2)
BUN: 14 mg/dL (ref 6–20)
CO2: 22 mmol/L (ref 22–32)
CREATININE: 1.59 mg/dL — AB (ref 0.61–1.24)
Calcium: 9.1 mg/dL (ref 8.9–10.3)
Chloride: 107 mmol/L (ref 101–111)
GFR, EST AFRICAN AMERICAN: 53 mL/min — AB (ref >60)
GFR, EST NON AFRICAN AMERICAN: 46 mL/min — AB (ref >60)
Glucose, Bld: 117 mg/dL — ABNORMAL HIGH (ref 65–99)
Potassium: 4.1 mmol/L (ref 3.5–5.1)
Sodium: 140 mmol/L (ref 135–145)
TOTAL PROTEIN: 7.7 g/dL (ref 6.5–8.1)

## 2017-09-03 LAB — LIPASE, BLOOD: Lipase: 41 U/L (ref 11–51)

## 2017-09-03 MED ORDER — IOPAMIDOL (ISOVUE-300) INJECTION 61%
INTRAVENOUS | Status: AC
  Administered 2017-09-03: 80 mL
  Filled 2017-09-03: qty 100

## 2017-09-03 NOTE — ED Notes (Signed)
Patient transported to CT 

## 2017-09-03 NOTE — Discharge Instructions (Signed)
Return here as needed.  Follow-up with your surgeon.

## 2017-09-03 NOTE — ED Triage Notes (Signed)
Pt to ER for evaluation of RLQ abdominal pain onset two days ago. States cholecystectomy last Wednesday without complication. States is having normal bowel movements, denies nausea and vomiting. States is relieved with prescribed pain medication. NAD at this time.

## 2017-09-03 NOTE — ED Provider Notes (Signed)
[facility] EMERGENCY DEPARTMENT Provider Note   CSN: [id number] Arrival date & time: 09/03/17  0825     History   Chief Complaint Chief Complaint  Patient presents with  . Post-op Problem  . Abdominal Pain    HPI James [name] is a 62 y.o. male.  HPI Patient presents to the emergency department with right lower abdominal pain that started 2 days ago.  Patient states he had cholecystectomy last week.  The patient states that he has been doing well up until this point when he started having this pain.  The patient states he been having bowel movements.  The patient states that nothing seems to make the condition better or worse.  Patient has had some nausea. states he did take his pain medication from his surgery which seemed to help with his symptoms.  The patient denies chest pain, shortness of breath, headache,blurred vision, neck pain, fever, cough, weakness, numbness, dizziness, anorexia, edema,  vomiting, diarrhea, rash, back pain, dysuria, hematemesis, bloody stool, near syncope, or syncope. Past Medical History:  Diagnosis Date  . Acid reflux   . Arthritis   . Cholelithiasis   . Chronic headaches    on cymbalta  . Cirrhosis ([place])   . Colon polyps   . Complication of anesthesia    problems waking up from anesthesia  . Depression    on cymbalta  . Diabetes mellitus with neuropathy ([place])    borderline per primary MD  . Diverticulitis 03/2013  . Eczema   . Elevated LFTs   . Fatty liver   . History of kidney stones   . Hypertension   . Insomnia 04/26/2013  . Kidney stone   . Morbid obesity ([place])   . Neuromuscular disorder ([facility])    neuropathy  . Neuropathy   . Post-traumatic hydrocephalus    s/p shunts x 2 (first got infected )  . Psoriasis    sees Dr [name]  . Psoriatic arthritis ([place])   . 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 Sleep apnea    no CPAP     . Stomach ulcer   . Testosterone deficiency  04/28/2011    Patient Active Problem List   Diagnosis Date Noted  . RUQ abdominal pain 07/20/2017  . Gallstones 07/20/2017  . Idiopathic intracranial hypertension 01/14/2017  . REM behavioral disorder 01/14/2017  . Liver cirrhosis secondary to NASH (nonalcoholic steatohepatitis) ([place]) 01/02/2016  . H/O craniotomy 05/07/2015  . OSA on CPAP 05/07/2015  . Annual physical exam 04/08/2015  . PCP NOTES >>> 04/08/2015  . Hypersomnia with sleep apnea 01/28/2015  . Severe obesity (BMI >= 40) ([place]) 01/28/2015  . Obstructive hydrocephalus 01/28/2015  . Chronic fatigue 01/28/2015  . Depression 09/04/2014  . OSA -- dx ~ 2012, cpap intolerant 09/04/2014  . Insomnia 04/26/2013  . Sigmoid diverticulitis 04/25/2013  . Diabetes with neuropathy 04/25/2013  . Cirrhosis ([place]) 04/25/2013  . Presence of cerebrospinal fluid drainage device 07/28/2011  . Psoriatic arthritis ([place]) 04/28/2011  . Headache 04/28/2011  . GERD (gastroesophageal reflux disease) 04/28/2011  . Urinary retention 04/28/2011  . Hypertension 04/28/2011  . Testosterone deficiency 04/28/2011    Past Surgical History:  Procedure Laterality Date  . BACK SURGERY  1980  . CHOLECYSTECTOMY  08/25/2017   laproscopic   . CHOLECYSTECTOMY N/A 08/25/2017   Procedure: LAPAROSCOPIC CHOLECYSTECTOMY WITH INTRAOPERATIVE CHOLANGIOGRAM;  Surgeon: [name], MD;  Location: [facility];  Service: General;  Laterality: N/A;  .  SHOULDER SURGERY Left 2010  . TOE SURGERY Left 2018  . TOTAL HIP ARTHROPLASTY Left 2011  . VENTRICULOPERITONEAL SHUNT  2007   x2       Home Medications    Prior to Admission medications   Medication Sig Start Date End Date Taking? Authorizing Provider  Adalimumab (HUMIRA PEN) 40 MG/0.8ML PNKT Inject 0.8 mLs into the skin every 14 (fourteen) days. 02/09/17  Yes Jegede, Olugbemiga E, MD  atorvastatin (LIPITOR) 10 MG tablet TAKE 1 TABLET (10 MG TOTAL) BY MOUTH DAILY. 08/17/17  Yes Elayne Snare, MD  Blood Glucose Monitoring  Suppl (FREESTYLE FREEDOM LITE) w/Device KIT Use to check blood sugars 3 times daily. Dx code: E11.9 10/29/16  Yes Elayne Snare, MD  carvedilol (COREG) 12.5 MG tablet Take 1 tablet (12.5 mg total) by mouth 2 (two) times daily with a meal. 05/24/17  Yes Paz, Alda Berthold, MD  clonazePAM (KLONOPIN) 0.5 MG tablet Take 1 tablet (0.5 mg total) by mouth at bedtime. Fax to [facility]  [phone] 01/14/17  Yes Sater, Nanine Means, MD  DULoxetine (CYMBALTA) 60 MG capsule Take 2 capsules (120 mg total) by mouth daily. Patient taking differently: Take 60 mg by mouth 2 (two) times daily.  04/08/17  Yes Paz, Alda Berthold, MD  fenofibrate micronized (LOFIBRA) 134 MG capsule TAKE 1 CAPSULE (134 MG TOTAL) BY MOUTH DAILY BEFORE BREAKFAST. 07/07/17  Yes Elayne Snare, MD  gabapentin (NEURONTIN) 600 MG tablet Take 1 tablet (600 mg total) by mouth 3 (three) times daily. 06/03/17  Yes Paz, Alda Berthold, MD  glimepiride (AMARYL) 1 MG tablet Take 1 tablet (1 mg total) by mouth daily before supper. 07/19/17  Yes Elayne Snare, MD  glucose blood (ACCU-CHEK GUIDE) test strip Use to test blood sugar three times daily 07/19/17  Yes Elayne Snare, MD  Lancets (FREESTYLE) lancets Use to check blood sugar 3 times daily. Dx code E11.9 10/29/16  Yes Elayne Snare, MD  levETIRAcetam (KEPPRA) 750 MG tablet Take 1 tablet (750 mg total) by mouth 2 (two) times daily. 01/14/17  Yes Sater, Nanine Means, MD  metFORMIN (GLUCOPHAGE) 1000 MG tablet TAKE 1 TABLET BY MOUTH TWICE DAILY WITH A MEAL 03/05/17  Yes Elayne Snare, MD  omeprazole (PRILOSEC) 40 MG capsule Take 1 capsule (40 mg total) by mouth 2 (two) times daily before a meal. 07/20/17  Yes Zehr, Laban Emperor, PA-C  oxyCODONE-acetaminophen (PERCOCET/ROXICET) 5-325 MG tablet Take 1-2 tablets by mouth every 6 (six) hours as needed for moderate pain. 08/26/17  Yes [name], MD  TRULICITY 1.5 PN/3.6[facility] THE CONTENTS OF ONE PEN ONCE PER WEEK 02/05/17  Yes Elayne Snare, MD    Family History Family History  Problem Relation Age  of Onset  . Healthy Mother   . Lung cancer Father        alive, former smoker   . Heart disease Brother        MI age 32  . Other Brother        Murdered  . Down syndrome Son   . Diabetes Neg Hx   . Prostate cancer Neg Hx   . Colon cancer Neg Hx     Social History Social History   Tobacco Use  . Smoking status: Never Smoker  . Smokeless tobacco: Never Used  Substance Use Topics  . Alcohol use: No  . Drug use: No     Allergies   Hydrocodone-homatropine; Morphine and related; and Sulfa drugs cross reactors   Review of Systems Review of  Systems All other systems negative except as documented in the HPI. All pertinent positives and negatives as reviewed in the HPI.  Physical Exam Updated Vital Signs BP 136/88   Pulse 64   Temp 98.7 F (37.1 C) (Oral)   Resp 17   Ht 5' 11" (1.803 m)   Wt 115.7 kg (255 lb)   SpO2 98%   BMI 35.57 kg/m   Physical Exam  Constitutional: He is oriented to person, place, and time. He appears well-developed and well-nourished. No distress.  HENT:  Head: Normocephalic and atraumatic.  Mouth/Throat: Oropharynx is clear and moist.  Eyes: Pupils are equal, round, and reactive to light.  Neck: Normal range of motion. Neck supple.  Cardiovascular: Normal rate, regular rhythm and normal heart sounds. Exam reveals no gallop and no friction rub.  No murmur heard. Pulmonary/Chest: Effort normal and breath sounds normal. No respiratory distress. He has no wheezes.  Abdominal: Soft. Bowel sounds are normal. He exhibits no distension. There is tenderness in the right lower quadrant. There is no rigidity, no rebound and no guarding.  Neurological: He is alert and oriented to person, place, and time. He exhibits normal muscle tone. Coordination normal.  Skin: Skin is warm and dry. Capillary refill takes less than 2 seconds. No rash noted. No erythema.  Psychiatric: He has a normal mood and affect. His behavior is normal.  Nursing note and vitals  reviewed.    ED Treatments / Results  Labs (all labs ordered are listed, but only abnormal results are displayed) Labs Reviewed  COMPREHENSIVE METABOLIC PANEL - Abnormal; Notable for the following components:      Result Value   Glucose, Bld 117 (*)    Creatinine, Ser 1.59 (*)    GFR calc non Af Amer 46 (*)    GFR calc Af Amer 53 (*)    All other components within normal limits  URINALYSIS, ROUTINE W REFLEX MICROSCOPIC - Abnormal; Notable for the following components:   Color, Urine AMBER (*)    Protein, ur 30 (*)    Squamous Epithelial / LPF 0-5 (*)    All other components within normal limits  LIPASE, BLOOD  CBC    EKG  EKG Interpretation None       Radiology Ct Abdomen Pelvis W Contrast  Result Date: 09/03/2017 CLINICAL DATA:  Generalized acute abdominal pain, RIGHT upper quadrant pain, cholecystectomy last Wednesday, history of VP shunt, diabetes mellitus, cirrhosis, hypertension EXAM: CT ABDOMEN AND PELVIS WITH CONTRAST TECHNIQUE: Multidetector CT imaging of the abdomen and pelvis was performed using the standard protocol following bolus administration of intravenous contrast. Sagittal and coronal MPR images reconstructed from axial data set. CONTRAST:  70m ISOVUE-300 IOPAMIDOL (ISOVUE-300) INJECTION 61% IV. No oral contrast. COMPARISON:  07/17/2017 FINDINGS: Lower chest: Subpleural fat at inferior RIGHT hemithorax. Lung bases otherwise clear. Hepatobiliary: Gallbladder surgically absent with a small amount of fluid at gallbladder fossa. No definite defined fluid collection/abscess. Liver is slightly low in attenuation with diffusely nodular margins consistent with cirrhosis. No discrete hepatic mass lesion. Pancreas: Normal appearance Spleen: Normal appearance Adrenals/Urinary Tract: Adrenal glands, kidneys, ureters, and bladder normal appearance Stomach/Bowel: Normal appendix. Stomach and bowel loops normal appearance. Vascular/Lymphatic: Minimal atherosclerotic calcification  aorta. Aorta normal caliber. Circumaortic LEFT renal vein. No adenopathy. Scattered normal sized retroperitoneal nodes. Reproductive: Upper normal prostate size. Seminal vesicles normal appearance. Other: Scattered beam hardening artifacts in pelvis from LEFT hip prosthesis. VP shunt tubing RIGHT abdomen. No free air. Minimal perihepatic free fluid which may  be related to surgery or cirrhosis. No hernia. Mild stranding at umbilicus likely related to laparoscopic procedure. Musculoskeletal: Unremarkable IMPRESSION: Small amount of fluid at gallbladder fossa not unexpected post recent cholecystectomy. Minimal peripelvic fluid which could be related to surgery or cirrhotic liver. Otherwise negative exam. Electronically Signed   By: Lavonia Dana M.D.   On: 09/03/2017 12:03    Procedures Procedures (including critical care time)  Medications Ordered in ED Medications  iopamidol (ISOVUE-300) 61 % injection (80 mLs  Contrast Given 09/03/17 1119)     Initial Impression / Assessment and Plan / ED Course  I have reviewed the triage vital signs and the nursing notes.  Pertinent labs & imaging results that were available during my care of the patient were reviewed by me and considered in my medical decision making (see chart for details).     The patient's laboratory testing and CT scans do not show any significant abnormality at this time.  I did review the CT scan images.  I did speak with general surgery just to get their take on the patient's CT scan along with his laboratory testing and chief complaint they agreed that at this point a monitor the situation for any worsening.  They did so they will follow-up in the office with the patient.  Patient is advised of the results and the plan and agrees.  All questions were answered for the patient.  patient voices an understanding of the plan and results Final Clinical Impressions(s) / ED Diagnoses   Final diagnoses:  Right lower quadrant abdominal pain    Post-op pain    ED Discharge Orders    None       Dalia Heading, PA-C 09/05/17 0488    Lajean Saver, MD 09/06/17 1104

## 2017-09-03 NOTE — ED Notes (Signed)
Patient ambulatory to bathroom with steady gait at this time 

## 2017-09-03 NOTE — ED Notes (Signed)
Patient verbalizes understanding of discharge instructions. Opportunity for questioning and answers were provided. Armband removed by staff, pt discharged from ED ambulatory.   

## 2017-09-08 ENCOUNTER — Other Ambulatory Visit: Payer: Self-pay | Admitting: Neurology

## 2017-09-08 MED FILL — DULoxetine HCL 60 MG CPEP: 60 | 30 days supply | Qty: 60 | Fill #5

## 2017-09-08 MED FILL — clonazePAM 0.5 MG TABS: 0.5 | 30 days supply | Qty: 30 | Fill #0

## 2017-09-09 ENCOUNTER — Other Ambulatory Visit: Payer: Self-pay

## 2017-09-09 ENCOUNTER — Ambulatory Visit (INDEPENDENT_AMBULATORY_CARE_PROVIDER_SITE_OTHER): Payer: 59 | Admitting: Neurology

## 2017-09-09 ENCOUNTER — Encounter: Payer: Self-pay | Admitting: Neurology

## 2017-09-09 VITALS — BP 120/80 | HR 70 | Resp 18 | Ht 71.0 in | Wt 256.0 lb

## 2017-09-09 DIAGNOSIS — K7581 Nonalcoholic steatohepatitis (NASH): Secondary | ICD-10-CM

## 2017-09-09 DIAGNOSIS — E1122 Type 2 diabetes mellitus with diabetic chronic kidney disease: Secondary | ICD-10-CM

## 2017-09-09 DIAGNOSIS — L4050 Arthropathic psoriasis, unspecified: Secondary | ICD-10-CM | POA: Diagnosis not present

## 2017-09-09 DIAGNOSIS — K7460 Unspecified cirrhosis of liver: Secondary | ICD-10-CM | POA: Diagnosis not present

## 2017-09-09 DIAGNOSIS — G932 Benign intracranial hypertension: Secondary | ICD-10-CM | POA: Diagnosis not present

## 2017-09-09 DIAGNOSIS — G4752 REM sleep behavior disorder: Secondary | ICD-10-CM

## 2017-09-09 DIAGNOSIS — N182 Chronic kidney disease, stage 2 (mild): Secondary | ICD-10-CM | POA: Diagnosis not present

## 2017-09-09 DIAGNOSIS — R1031 Right lower quadrant pain: Secondary | ICD-10-CM | POA: Diagnosis not present

## 2017-09-09 DIAGNOSIS — G4733 Obstructive sleep apnea (adult) (pediatric): Secondary | ICD-10-CM | POA: Diagnosis not present

## 2017-09-09 MED ORDER — LEVETIRACETAM 750 MG PO TABS: 750.0000 mg | ORAL_TABLET | Freq: Two times a day (BID) | ORAL | 11 refills | Status: DC

## 2017-09-09 MED ORDER — CLONAZEPAM 0.5 MG PO TABS: 0.5000 mg | ORAL_TABLET | Freq: Every day | ORAL | 5 refills | Status: DC
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
## 2017-09-09 NOTE — Patient Instructions (Signed)
We discussed weight loss for the OSA as he was unable to use CPAP and does nit think he would use an oral appliance.   He reports ENT told him he is not a surgical candidate.     Try to exercise more and eat healthy.   We discussed joining a gym.      Reduce Cymbalta to one 60 mg pill.

## 2017-09-12 ENCOUNTER — Other Ambulatory Visit: Payer: Self-pay

## 2017-09-12 ENCOUNTER — Emergency Department (HOSPITAL_COMMUNITY): Payer: 59

## 2017-09-12 ENCOUNTER — Encounter (HOSPITAL_COMMUNITY): Payer: Self-pay | Admitting: Emergency Medicine

## 2017-09-12 ENCOUNTER — Emergency Department (HOSPITAL_COMMUNITY)
Admission: EM | Admit: 2017-09-12 | Discharge: 2017-09-13 | Disposition: A | Discharge status: Home / Self Care | Payer: 59 | Attending: Emergency Medicine | Admitting: Emergency Medicine

## 2017-09-12 DIAGNOSIS — I10 Essential (primary) hypertension: Secondary | ICD-10-CM | POA: Diagnosis not present

## 2017-09-12 DIAGNOSIS — Z96642 Presence of left artificial hip joint: Secondary | ICD-10-CM | POA: Diagnosis not present

## 2017-09-12 DIAGNOSIS — Z7984 Long term (current) use of oral hypoglycemic drugs: Secondary | ICD-10-CM | POA: Diagnosis not present

## 2017-09-12 DIAGNOSIS — R1031 Right lower quadrant pain: Secondary | ICD-10-CM | POA: Diagnosis not present

## 2017-09-12 DIAGNOSIS — Z79899 Other long term (current) drug therapy: Secondary | ICD-10-CM | POA: Insufficient documentation

## 2017-09-12 DIAGNOSIS — K5730 Diverticulosis of large intestine without perforation or abscess without bleeding: Secondary | ICD-10-CM | POA: Diagnosis not present

## 2017-09-12 DIAGNOSIS — R51 Headache: Secondary | ICD-10-CM | POA: Diagnosis not present

## 2017-09-12 DIAGNOSIS — R1030 Lower abdominal pain, unspecified: Secondary | ICD-10-CM | POA: Diagnosis present

## 2017-09-12 DIAGNOSIS — E1140 Type 2 diabetes mellitus with diabetic neuropathy, unspecified: Secondary | ICD-10-CM | POA: Diagnosis not present

## 2017-09-12 DIAGNOSIS — R0989 Other specified symptoms and signs involving the circulatory and respiratory systems: Secondary | ICD-10-CM | POA: Diagnosis not present

## 2017-09-12 LAB — CBC
HEMATOCRIT: 42.4 % (ref 39.0–52.0)
HEMOGLOBIN: 14.2 g/dL (ref 13.0–17.0)
MCH: 29.7 pg (ref 26.0–34.0)
MCHC: 33.5 g/dL (ref 30.0–36.0)
MCV: 88.7 fL (ref 78.0–100.0)
Platelets: 174 10*3/uL (ref 150–400)
RBC: 4.78 MIL/uL (ref 4.22–5.81)
RDW: 14.7 % (ref 11.5–15.5)
WBC: 6.5 10*3/uL (ref 4.0–10.5)

## 2017-09-12 LAB — COMPREHENSIVE METABOLIC PANEL
ALT: 38 U/L (ref 17–63)
ANION GAP: 12 (ref 5–15)
AST: 41 U/L (ref 15–41)
Albumin: 4.1 g/dL (ref 3.5–5.0)
Alkaline Phosphatase: 75 U/L (ref 38–126)
BUN: 22 mg/dL — ABNORMAL HIGH (ref 6–20)
CO2: 20 mmol/L — AB (ref 22–32)
Calcium: 10.2 mg/dL (ref 8.9–10.3)
Chloride: 104 mmol/L (ref 101–111)
Creatinine, Ser: 1.59 mg/dL — ABNORMAL HIGH (ref 0.61–1.24)
GFR calc Af Amer: 53 mL/min — ABNORMAL LOW (ref >60)
GFR calc non Af Amer: 46 mL/min — ABNORMAL LOW (ref >60)
Glucose, Bld: 123 mg/dL — ABNORMAL HIGH (ref 65–99)
POTASSIUM: 4.2 mmol/L (ref 3.5–5.1)
SODIUM: 136 mmol/L (ref 135–145)
TOTAL PROTEIN: 8.3 g/dL — AB (ref 6.5–8.1)
Total Bilirubin: 1.2 mg/dL (ref 0.3–1.2)

## 2017-09-12 LAB — URINALYSIS, ROUTINE W REFLEX MICROSCOPIC
Bilirubin Urine: NEGATIVE
Glucose, UA: NEGATIVE mg/dL
Hgb urine dipstick: NEGATIVE
Ketones, ur: NEGATIVE mg/dL
Leukocytes, UA: NEGATIVE
NITRITE: NEGATIVE
PH: 5 (ref 5.0–8.0)
Protein, ur: NEGATIVE mg/dL
SPECIFIC GRAVITY, URINE: 1.027 (ref 1.005–1.030)

## 2017-09-12 LAB — LIPASE, BLOOD: LIPASE: 72 U/L — AB (ref 11–51)

## 2017-09-12 MED ORDER — SODIUM CHLORIDE 0.9 % IV BOLUS (SEPSIS)
1000.0000 mL | Freq: Once | INTRAVENOUS | Status: AC
Start: 2017-09-12 — End: 2017-09-12
  Administered 2017-09-12: 1000 mL via INTRAVENOUS

## 2017-09-12 MED ORDER — HYDROMORPHONE HCL 1 MG/ML IJ SOLN
1.0000 mg | Freq: Once | INTRAMUSCULAR | Status: AC
Start: 2017-09-12 — End: 2017-09-12
  Administered 2017-09-12: 1 mg via INTRAVENOUS
  Filled 2017-09-12: qty 1

## 2017-09-12 MED ORDER — IOPAMIDOL (ISOVUE-300) INJECTION 61%
INTRAVENOUS | Status: AC
  Administered 2017-09-12: 100 mL
  Filled 2017-09-12: qty 30

## 2017-09-12 MED ORDER — HYDROMORPHONE HCL 1 MG/ML IJ SOLN
1.0000 mg | Freq: Once | INTRAMUSCULAR | Status: AC
  Administered 2017-09-12: 1 mg via INTRAVENOUS
  Filled 2017-09-12: qty 1

## 2017-09-12 MED ORDER — IOPAMIDOL (ISOVUE-300) INJECTION 61%
INTRAVENOUS | Status: AC
  Filled 2017-09-12: qty 100

## 2017-09-12 NOTE — Discharge Instructions (Signed)
Follow-up with your general surgeon and your neurosurgeon this week as directed.  If you were given medicines take as directed.  If you are on coumadin or contraceptives realize their levels and effectiveness is altered by many different medicines.  If you have any reaction (rash, tongues swelling, other) to the medicines stop taking and see a physician.    If your blood pressure was elevated in the ER make sure you follow up for management with a primary doctor or return for chest pain, shortness of breath or stroke symptoms.  Please follow up as directed and return to the ER or see a physician for new or worsening symptoms.  Thank you. Vitals:   09/12/17 1815 09/12/17 2046 09/12/17 2223 09/12/17 2315  BP: 133/90 120/85 126/90 130/78  Pulse: 80 78 78 74  Resp: 15 14 18 18   Temp:      TempSrc:      SpO2: 92% 94% 95% 91%  Weight:      Height:

## 2017-09-12 NOTE — ED Notes (Signed)
Patient transported to CT 

## 2017-09-12 NOTE — ED Notes (Signed)
Per CT- pt to be given oral contrast and will be scanned at 2200.

## 2017-09-12 NOTE — ED Provider Notes (Signed)
[facility] EMERGENCY DEPARTMENT Provider Note   CSN: [id number] Arrival date & time: 09/12/17  1456     History   Chief Complaint Chief Complaint  Patient presents with  . Abdominal Pain    HPI James [name] is a 62 y.o. male.  Patient with history of reflux, cirrhosis, cholecystectomy approximately 3 weeks ago by Dr. Marlou Starks, , kidney stone, obesity, posttraumatic hydrocephalus with shunt infection and revision 2007, stomach ulcer presents with worsening right-sided abdominal pain for the past week. Patient felt that surgery went overall well patient was seen in the emergency room for workup and then by his surgeon who felt it was likely related to constipation. Patient's been taking laxatives with no improvement in the pain. Pain is worsened now in the right lower quadrant. No fevers.patient has had headaches intermittent overall similar to previous. No new neurologic deficits.      Past Medical History:  Diagnosis Date  . Acid reflux   . Arthritis   . Cholelithiasis   . Chronic headaches    on cymbalta  . Cirrhosis ([place])   . Colon polyps   . Complication of anesthesia    problems waking up from anesthesia  . Depression    on cymbalta  . Diabetes mellitus with neuropathy ([place])    borderline per primary MD  . Diverticulitis 03/2013  . Eczema   . Elevated LFTs   . Fatty liver   . History of kidney stones   . Hypertension   . Insomnia 04/26/2013  . Kidney stone   . Morbid obesity ([place])   . Neuromuscular disorder ([facility])    neuropathy  . Neuropathy   . Post-traumatic hydrocephalus    s/p shunts x 2 (first got infected )  . Psoriasis    sees Dr [name]  . Psoriatic arthritis ([place])   . 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 Sleep apnea    no CPAP     . Stomach ulcer   . Testosterone deficiency 04/28/2011    Patient Active Problem List   Diagnosis Date Noted  . RUQ abdominal pain 07/20/2017  .  Gallstones 07/20/2017  . Idiopathic intracranial hypertension 01/14/2017  . REM behavioral disorder 01/14/2017  . Liver cirrhosis secondary to NASH (nonalcoholic steatohepatitis) ([place]) 01/02/2016  . H/O craniotomy 05/07/2015  . OSA on CPAP 05/07/2015  . Annual physical exam 04/08/2015  . PCP NOTES >>> 04/08/2015  . Hypersomnia with sleep apnea 01/28/2015  . Severe obesity (BMI >= 40) ([place]) 01/28/2015  . Obstructive hydrocephalus 01/28/2015  . Chronic fatigue 01/28/2015  . Depression 09/04/2014  . OSA -- dx ~ 2012, cpap intolerant 09/04/2014  . Insomnia 04/26/2013  . Sigmoid diverticulitis 04/25/2013  . Diabetes with neuropathy 04/25/2013  . Cirrhosis ([place]) 04/25/2013  . Presence of cerebrospinal fluid drainage device 07/28/2011  . Psoriatic arthritis ([place]) 04/28/2011  . Headache 04/28/2011  . GERD (gastroesophageal reflux disease) 04/28/2011  . Urinary retention 04/28/2011  . Hypertension 04/28/2011  . Testosterone deficiency 04/28/2011    Past Surgical History:  Procedure Laterality Date  . BACK SURGERY  1980  . CHOLECYSTECTOMY  08/25/2017   laproscopic   . CHOLECYSTECTOMY N/A 08/25/2017   Procedure: LAPAROSCOPIC CHOLECYSTECTOMY WITH INTRAOPERATIVE CHOLANGIOGRAM;  Surgeon: [name], MD;  Location: [facility];  Service: General;  Laterality: N/A;  . SHOULDER SURGERY Left 2010  . TOE SURGERY Left 2018  . TOTAL HIP ARTHROPLASTY Left 2011  . VENTRICULOPERITONEAL SHUNT  2007  x2       Home Medications    Prior to Admission medications   Medication Sig Start Date End Date Taking? Authorizing Provider  Adalimumab (HUMIRA PEN) 40 MG/0.8ML PNKT Inject 0.8 mLs into the skin every 14 (fourteen) days. 02/09/17   Tresa Garter, MD  atorvastatin (LIPITOR) 10 MG tablet TAKE 1 TABLET (10 MG TOTAL) BY MOUTH DAILY. 08/17/17   Elayne Snare, MD  Blood Glucose Monitoring Suppl (FREESTYLE FREEDOM LITE) w/Device KIT Use to check blood sugars 3 times daily. Dx code: E11.9 10/29/16    Elayne Snare, MD  carvedilol (COREG) 12.5 MG tablet Take 1 tablet (12.5 mg total) by mouth 2 (two) times daily with a meal. 05/24/17   Colon Branch, MD  clonazePAM (KLONOPIN) 0.5 MG tablet Take 1 tablet (0.5 mg total) by mouth at bedtime. 09/09/17   Sater, Nanine Means, MD  DULoxetine (CYMBALTA) 60 MG capsule Take 2 capsules (120 mg total) by mouth daily. Patient taking differently: Take 60 mg by mouth 2 (two) times daily.  04/08/17   Colon Branch, MD  fenofibrate micronized (LOFIBRA) 134 MG capsule TAKE 1 CAPSULE (134 MG TOTAL) BY MOUTH DAILY BEFORE BREAKFAST. 07/07/17   Elayne Snare, MD  gabapentin (NEURONTIN) 600 MG tablet Take 1 tablet (600 mg total) by mouth 3 (three) times daily. 06/03/17   Colon Branch, MD  glimepiride (AMARYL) 1 MG tablet Take 1 tablet (1 mg total) by mouth daily before supper. 07/19/17   Elayne Snare, MD  glucose blood (ACCU-CHEK GUIDE) test strip Use to test blood sugar three times daily 07/19/17   Elayne Snare, MD  Lancets (FREESTYLE) lancets Use to check blood sugar 3 times daily. Dx code E11.9 10/29/16   Elayne Snare, MD  levETIRAcetam (KEPPRA) 750 MG tablet Take 1 tablet (750 mg total) by mouth 2 (two) times daily. 09/09/17   Sater, Nanine Means, MD  metFORMIN (GLUCOPHAGE) 1000 MG tablet TAKE 1 TABLET BY MOUTH TWICE DAILY WITH A MEAL 03/05/17   Elayne Snare, MD  omeprazole (PRILOSEC) 40 MG capsule Take 1 capsule (40 mg total) by mouth 2 (two) times daily before a meal. 07/20/17   Zehr, Janett Billow D, PA-C  TRULICITY 1.5 YD/7.4JO [facility] INJECT THE CONTENTS OF ONE PEN ONCE PER WEEK 02/05/17   Elayne Snare, MD    Family History Family History  Problem Relation Age of Onset  . Healthy Mother   . Lung cancer Father        alive, former smoker   . Heart disease Brother        MI age 53  . Other Brother        Murdered  . Down syndrome Son   . Diabetes Neg Hx   . Prostate cancer Neg Hx   . Colon cancer Neg Hx     Social History Social History   Tobacco Use  . Smoking status: Never Smoker  .  Smokeless tobacco: Never Used  Substance Use Topics  . Alcohol use: No  . Drug use: No     Allergies   Hydrocodone-homatropine; Morphine and related; and Sulfa drugs cross reactors   Review of Systems Review of Systems  Constitutional: Positive for appetite change. Negative for fever.  HENT: Negative for congestion.   Eyes: Negative for visual disturbance.  Respiratory: Negative for shortness of breath.   Cardiovascular: Negative for chest pain.  Gastrointestinal: Positive for abdominal pain. Negative for vomiting.  Genitourinary: Negative for dysuria and flank pain.  Musculoskeletal: Negative for  back pain, neck pain and neck stiffness.  Skin: Negative for rash.  Neurological: Positive for headaches. Negative for light-headedness.     Physical Exam Updated Vital Signs BP 130/78   Pulse 74   Temp 98.6 F (37 C) (Oral)   Resp 18   Ht 5' 11"  (1.803 m)   Wt 115.7 kg (255 lb)   SpO2 91%   BMI 35.57 kg/m   Physical Exam  Constitutional: He is oriented to person, place, and time. He appears well-developed and well-nourished.  HENT:  Head: Normocephalic and atraumatic.  Eyes: Conjunctivae are normal. Right eye exhibits no discharge. Left eye exhibits no discharge.  Neck: Normal range of motion. Neck supple. No tracheal deviation present.  Cardiovascular: Normal rate and regular rhythm.  Pulmonary/Chest: Effort normal and breath sounds normal.  Abdominal: Soft. He exhibits no distension. There is tenderness in the right lower quadrant. There is no guarding.  Musculoskeletal: He exhibits no edema.  Neurological: He is alert and oriented to person, place, and time.  Skin: Skin is warm. No rash noted.  Psychiatric: He has a normal mood and affect.  Nursing note and vitals reviewed.    ED Treatments / Results  Labs (all labs ordered are listed, but only abnormal results are displayed) Labs Reviewed  LIPASE, BLOOD - Abnormal; Notable for the following components:       Result Value   Lipase 72 (*)    All other components within normal limits  COMPREHENSIVE METABOLIC PANEL - Abnormal; Notable for the following components:   CO2 20 (*)    Glucose, Bld 123 (*)    BUN 22 (*)    Creatinine, Ser 1.59 (*)    Total Protein 8.3 (*)    GFR calc non Af Amer 46 (*)    GFR calc Af Amer 53 (*)    All other components within normal limits  CBC  URINALYSIS, ROUTINE W REFLEX MICROSCOPIC    EKG  EKG Interpretation None       Radiology Dg Chest 1 View  Result Date: 09/12/2017 CLINICAL DATA:  62 year old male with history of ventriculoperitoneal shunt. EXAM: CHEST  1 VIEW COMPARISON:  Chest x-ray 10/04/2013. FINDINGS: Left-sided VP shunt tubing noted. Unfortunately, the lower half of this tubing projects over the spine and is largely obscured on today's examination. The upper half of the tubing is intact without obvious discontinuity. Lung volumes are normal. No consolidative airspace disease. No pleural effusions. No pneumothorax. No pulmonary nodule or mass noted. Pulmonary vasculature and the cardiomediastinal silhouette are within normal limits. Orthopedic fixation hardware in the upper thoracic and lower cervical spine. IMPRESSION: 1. No radiographic evidence of acute cardiopulmonary disease. 2. VP shunt tubing is poorly visualized, but visualized portions appear intact. Electronically Signed   By: Vinnie Langton M.D.   On: 09/12/2017 19:48   Ct Head Wo Contrast  Result Date: 09/12/2017 CLINICAL DATA:  Acute headache. History of sleep apnea, posttraumatic hydrocephalus, hypertension and diabetes. EXAM: CT HEAD WITHOUT CONTRAST TECHNIQUE: Contiguous axial images were obtained from the base of the skull through the vertex without intravenous contrast. COMPARISON:  CT from 11/12/2016 FINDINGS: Brain: Left frontal approach ventricular shunt crosses midline with tip redemonstrated over the right anterior limb of the internal capsule. Mild encephalomalacia along the  shunt tract. No hydrocephalus. No acute intracranial hemorrhage, midline shift or edema. No large vascular territory infarct. Midline fourth ventricle and basal cisterns. Vascular: No hyperdense vessel or unexpected calcification. Skull: The ventricular shunt port is seen projecting  over the left temporal skull. No acute osseous abnormality. Sinuses/Orbits: Minimal bilateral maxillary and ethmoid sinus mucosal thickening. Intact orbits and globes. Other: None IMPRESSION: Chronic stable appearance of the brain with ventricular shunt in place. Stable ventricular size without hydrocephalus. Electronically Signed   By: Ashley Royalty M.D.   On: 09/12/2017 21:00   Ct Abdomen Pelvis W Contrast  Result Date: 09/12/2017 CLINICAL DATA:  Initial evaluation for acute right lower quadrant pain for 4 days, nausea. History of recent cholecystectomy. EXAM: CT ABDOMEN AND PELVIS WITH CONTRAST TECHNIQUE: Multidetector CT imaging of the abdomen and pelvis was performed using the standard protocol following bolus administration of intravenous contrast. CONTRAST:  119m ISOVUE-300 IOPAMIDOL (ISOVUE-300) INJECTION 61% COMPARISON:  Prior CT from 09/03/2017. FINDINGS: Lower chest: Visualized lung bases are clear. Hepatobiliary: Mild diffuse hypoattenuation of the liver, consistent with steatosis. Nodularity of the patent contour compatible with underlying cirrhosis. No focal intrahepatic masses. Postoperative changes from recent cholecystectomy seen with small amount of fluid density and soft tissue stranding within the gallbladder fossa of, felt to be within normal limits for normal expected postoperative changes. This is relatively similar in appearance as compared to prior exam. No new collections. Trace intrahepatic biliary dilatation within the right hepatic lobe is stable, likely related to post cholecystectomy changes. No extrahepatic biliary dilatation. Pancreas: Pancreas within normal limits. Mild hazy soft tissue stranding  adjacent to the uncinate process/pancreatic head most consistent with postoperative changes, stable. Spleen: Mild splenomegaly with the spleen measuring 15.6 cm in diameter, stable. Spleen otherwise unremarkable. Adrenals/Urinary Tract: Adrenal glands are normal. Kidneys equal size with symmetric enhancement. Few subcentimeter hypodensities noted, too small the characterize, but statistically likely reflects small cyst. Punctate nonobstructive right renal nephrolithiasis measuring up to 3 mm noted. No hydronephrosis. No focal enhancing renal mass. No hydroureter. Evaluation of the distal ureter somewhat limited by streak artifact from adjacent left hip arthroplasty. Bladder largely decompressed without definite acute abnormality. Mild circumferential bladder wall thickening most like related incomplete distension. Stomach/Bowel: Stomach within normal limits. No evidence for bowel obstruction. Appendix within normal limits. Colonic diverticulosis without evidence for acute diverticulitis. No acute inflammatory changes seen about the bowels. Vascular/Lymphatic: Normal intravascular enhancement seen throughout the intra-abdominal aorta no aneurysm. Mesenteric vessels are patent proximally. Retroaortic left renal vein noted. Mild aortic atherosclerosis. No pathologically enlarged intra-abdominal or pelvic lymph nodes. Reproductive: Prostate within normal limits. Other: No free air identified. Small amount of free perihepatic fluid noted, which may be postoperative and/or related to cirrhosis. 2.6 cm focus of fat necrosis within the left lower quadrant noted, similar to multiple previous exams. Small paraumbilical fat containing hernia. Pos periumbilical postoperative stranding has largely resolved since previous. VP shunt catheter again noted with tip terminating in the right lower quadrant. Musculoskeletal: Left total hip arthroplasty. No acute osseus abnormality. No worrisome lytic or blastic osseous lesions. Chronic  facet mediated grade 1 anterolisthesis of L4 on L5 noted. IMPRESSION: 1. Mild residual postoperative stranding within the gallbladder fossa of, within normal limits for normal expected postoperative changes from recent cholecystectomy. No complication identified. 2. No other acute intra-abdominal or pelvic process. 3. Normal appendix. 4. Trace free perihepatic fluid, which may be related to recent surgery and/or hepatic cirrhosis. 5. Mild splenomegaly. 6. Nonobstructive right renal nephrolithiasis. 7. Colonic diverticulosis without evidence for acute diverticulitis. 8. VP shunt catheter in place with tip in the right lower quadrant. Electronically Signed   By: BJeannine BogaM.D.   On: 09/12/2017 23:04    Procedures Procedures (including critical  care time)  Medications Ordered in ED Medications  iopamidol (ISOVUE-300) 61 % injection (not administered)  HYDROmorphone (DILAUDID) injection 1 mg (1 mg Intravenous Given 09/12/17 1923)  sodium chloride 0.9 % bolus 1,000 mL (0 mLs Intravenous Stopped 09/12/17 2149)  iopamidol (ISOVUE-300) 61 % injection (100 mLs  Contrast Given 09/12/17 2200)  HYDROmorphone (DILAUDID) injection 1 mg (1 mg Intravenous Given 09/12/17 2221)     Initial Impression / Assessment and Plan / ED Course  I have reviewed the triage vital signs and the nursing notes.  Pertinent labs & imaging results that were available during my care of the patient were reviewed by me and considered in my medical decision making (see chart for details).    Patient presents with worsening right lower quadrant abdominal pain differential includes appendicitis, liver related, bowel changes and surgery, shunt issue, urine related, other. Plan for CT scan of the head, chest x-ray to evaluate shunt, CT abdomen pelvis, pain meds, IV fluids and reassessment.  On recheck patient's pain improved. Delayed CT scan due to radiology requesting oral contrast.  CT scan results reviewed no acute findings.  On recheck pain control. Discussed importance of follow-up with patient's general surgeon and his neurosurgeon.  Results and differential diagnosis were discussed with the patient/parent/guardian. Xrays were independently reviewed by myself.  Close follow up outpatient was discussed, comfortable with the plan.   Medications  iopamidol (ISOVUE-300) 61 % injection (not administered)  HYDROmorphone (DILAUDID) injection 1 mg (1 mg Intravenous Given 09/12/17 1923)  sodium chloride 0.9 % bolus 1,000 mL (0 mLs Intravenous Stopped 09/12/17 2149)  iopamidol (ISOVUE-300) 61 % injection (100 mLs  Contrast Given 09/12/17 2200)  HYDROmorphone (DILAUDID) injection 1 mg (1 mg Intravenous Given 09/12/17 2221)    Vitals:   09/12/17 1815 09/12/17 2046 09/12/17 2223 09/12/17 2315  BP: 133/90 120/85 126/90 130/78  Pulse: 80 78 78 74  Resp: 15 14 18 18   Temp:      TempSrc:      SpO2: 92% 94% 95% 91%  Weight:      Height:        Final diagnoses:  Right lower quadrant abdominal pain    Final Clinical Impressions(s) / ED Diagnoses   Final diagnoses:  Right lower quadrant abdominal pain    ED Discharge Orders    None       Elnora Morrison, MD 09/12/17 2349

## 2017-09-12 NOTE — ED Triage Notes (Addendum)
PT states he had a cholecystectomy 2 weeks ago.  C/o RLQ pain since Wednesday and nausea today.  Taking Miralax- last BM yesterday.  Decreased urination.

## 2017-09-13 NOTE — ED Notes (Signed)
Pt verbalizes understanding of d/c instructions. Pt ambulatory at d/c with all belongings.

## 2017-09-15 ENCOUNTER — Ambulatory Visit (INDEPENDENT_AMBULATORY_CARE_PROVIDER_SITE_OTHER): Payer: 59 | Admitting: Medical

## 2017-09-15 ENCOUNTER — Telehealth: Payer: Self-pay | Admitting: Medical

## 2017-09-15 ENCOUNTER — Ambulatory Visit (INDEPENDENT_AMBULATORY_CARE_PROVIDER_SITE_OTHER): Payer: 59 | Admitting: Cardiology

## 2017-09-15 ENCOUNTER — Encounter: Payer: Self-pay | Admitting: Medical

## 2017-09-15 ENCOUNTER — Encounter: Payer: Self-pay | Admitting: Cardiology

## 2017-09-15 ENCOUNTER — Ambulatory Visit (HOSPITAL_BASED_OUTPATIENT_CLINIC_OR_DEPARTMENT_OTHER)
Admission: RE | Admit: 2017-09-15 | Discharge: 2017-09-15 | Disposition: A | Discharge status: Home / Self Care | Payer: 59 | Source: Ambulatory Visit | Attending: Medical | Admitting: Medical

## 2017-09-15 ENCOUNTER — Other Ambulatory Visit: Payer: Self-pay | Admitting: Endocrinology

## 2017-09-15 VITALS — BP 132/88 | HR 84 | Temp 98.8°F | Resp 16 | Ht 69.0 in | Wt 257.6 lb

## 2017-09-15 VITALS — BP 136/84 | HR 86 | Ht 69.0 in | Wt 256.0 lb

## 2017-09-15 DIAGNOSIS — K7460 Unspecified cirrhosis of liver: Secondary | ICD-10-CM

## 2017-09-15 DIAGNOSIS — E1122 Type 2 diabetes mellitus with diabetic chronic kidney disease: Secondary | ICD-10-CM

## 2017-09-15 DIAGNOSIS — J40 Bronchitis, not specified as acute or chronic: Secondary | ICD-10-CM | POA: Insufficient documentation

## 2017-09-15 DIAGNOSIS — J111 Influenza due to unidentified influenza virus with other respiratory manifestations: Secondary | ICD-10-CM | POA: Insufficient documentation

## 2017-09-15 DIAGNOSIS — K8020 Calculus of gallbladder without cholecystitis without obstruction: Secondary | ICD-10-CM

## 2017-09-15 DIAGNOSIS — I10 Essential (primary) hypertension: Secondary | ICD-10-CM | POA: Diagnosis not present

## 2017-09-15 DIAGNOSIS — N182 Chronic kidney disease, stage 2 (mild): Secondary | ICD-10-CM | POA: Diagnosis not present

## 2017-09-15 DIAGNOSIS — R05 Cough: Secondary | ICD-10-CM | POA: Insufficient documentation

## 2017-09-15 DIAGNOSIS — R059 Cough, unspecified: Secondary | ICD-10-CM

## 2017-09-15 MED ORDER — OSELTAMIVIR PHOSPHATE 75 MG PO CAPS: 75.0000 mg | ORAL_CAPSULE | Freq: Two times a day (BID) | ORAL | 0 refills | Status: DC

## 2017-09-15 MED FILL — TRULICITY 1.5 MG/0.5 ML PEN: 1.5 | 28 days supply | Qty: 2 | Fill #0

## 2017-09-15 MED FILL — CARVEDILOL 12.5 MG TABLET: 12.5 | 30 days supply | Qty: 60 | Fill #3

## 2017-09-15 MED FILL — BENZONATATE 200 MG CAP: 200 | 10 days supply | Qty: 30 | Fill #0

## 2017-09-15 MED FILL — FENOFIBRATE 134 MG CAPSULE: 134 | 30 days supply | Qty: 30 | Fill #1

## 2017-09-15 MED FILL — GLIMEPIRIDE 1 MG TABLET: 1 | 30 days supply | Qty: 30 | Fill #0

## 2017-09-15 MED FILL — OSELTAMIVIR PHOSPHATE 75 MG: 75 | 5 days supply | Qty: 10 | Fill #0

## 2017-09-15 MED FILL — levETIRAcetam 750 MG TABS: 750 | 30 days supply | Qty: 60 | Fill #6

## 2017-09-15 MED FILL — AZITHROMYCIN 250 MG TABLET: 250 | 5 days supply | Qty: 6 | Fill #0

## 2017-09-15 NOTE — Progress Notes (Signed)
Subjective:    Patient ID: James [name], male    DOB: January 25, 1956, 62 y.o.   MRN: [id number]  HPI   Pt in states last night he started with cough all night. Cough is moderate to severe. He could barely sleep with cough. Pt bodyaches all over. Pt also feels fatigued.  Pt was in ED the other day/sunday and he thinks he caught something there. Pt did not get flu vaccine this year.  Pt has cirrhosis of the liver. Pt had gallbladder removed on August 24, 2017. Pt states surgeon visualized liver and stated surgeon told him liver "did no look good" Also when he had to move liver some to do procedure stated was hard.  Pt work up in ED was negative. No cause found on CT of abdomen/pelvis with contrast. But some other finding such as diverticulosis.    Review of Systems  Constitutional: Positive for chills, fatigue and fever.  HENT: Positive for congestion. Negative for hearing loss, sinus pressure, sinus pain and sore throat.   Respiratory: Positive for cough. Negative for shortness of breath and wheezing.   Cardiovascular: Negative for chest pain and palpitations.  Gastrointestinal: Negative for abdominal pain, diarrhea, nausea, rectal pain and vomiting.       Pt has same level of pain he had the other day in ED. When coughs it is worse.  Genitourinary: Negative for decreased urine volume, frequency and urgency.  Musculoskeletal: Negative for back pain, myalgias and neck stiffness.  Neurological: Negative for dizziness, speech difficulty, weakness, numbness and headaches.  Hematological: Negative for adenopathy. Does not bruise/bleed easily.  Psychiatric/Behavioral: Negative for behavioral problems and confusion.    Past Medical History:  Diagnosis Date  . Acid reflux   . Arthritis   . Cholelithiasis   . Chronic headaches    on cymbalta  . Cirrhosis ([place])   . Colon polyps   . Complication of anesthesia    problems waking up from anesthesia  . Depression    on cymbalta  .  Diabetes mellitus with neuropathy ([place])    borderline per primary MD  . Diverticulitis 03/2013  . Eczema   . Elevated LFTs   . Fatty liver   . History of kidney stones   . Hypertension   . Insomnia 04/26/2013  . Kidney stone   . Morbid obesity ([place])   . Neuromuscular disorder (H[facility])    neuropathy  . Neuropathy   . Post-traumatic hydrocephalus    s/p shunts x 2 (first got infected )  . Psoriasis    sees Dr [name]  . Psoriatic arthritis ([place])   . 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 Sleep apnea    no CPAP     . Stomach ulcer   . Testosterone deficiency 04/28/2011     Social History   Socioeconomic History  . Marital status: Married    Spouse name: [name]  . Number of children: 2  . Years of education: Not on file  . Highest education level: Not on file  Social Needs  . Financial resource strain: Not on file  . Food insecurity - worry: Not on file  . Food insecurity - inability: Not on file  . Transportation needs - medical: Not on file  . Transportation needs - non-medical: Not on file  Occupational History  . Occupation: disable   Tobacco Use  . Smoking status: Never Smoker  . Smokeless tobacco: Never Used  Substance and Sexual Activity  .  Alcohol use: No  . Drug use: No  . Sexual activity: Yes    Partners: Female  Other Topics Concern  . 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    Past Surgical History:  Procedure Laterality Date  . BACK SURGERY  1980  . CHOLECYSTECTOMY  08/25/2017   laproscopic   . CHOLECYSTECTOMY N/A 08/25/2017   Procedure: LAPAROSCOPIC CHOLECYSTECTOMY WITH INTRAOPERATIVE CHOLANGIOGRAM;  Surgeon: [name], MD;  Location: [facility];  Service: General;  Laterality: N/A;  . SHOULDER SURGERY Left 2010  . TOE SURGERY Left 2018  . TOTAL  HIP ARTHROPLASTY Left 2011  . VENTRICULOPERITONEAL SHUNT  2007   x2    Family History  Problem Relation Age of Onset  . Healthy Mother   . Lung cancer Father        alive, former smoker   . Heart disease Brother        MI age 38  . Other Brother        Murdered  . Down syndrome Son   . Diabetes Neg Hx   . Prostate cancer Neg Hx   . Colon cancer Neg Hx     Allergies  Allergen Reactions  . Hydrocodone-Homatropine Other (See Comments)    Depressed feeling  . Morphine And Related Other (See Comments)    Hallucinations, back in the 80s. States has taken vicodin before w/o problems   . Sulfa Drugs Cross Reactors Rash    Current Outpatient Medications on File Prior to Visit  Medication Sig Dispense Refill  . Adalimumab (HUMIRA PEN) 40 MG/0.8ML PNKT Inject 0.8 mLs into the skin every 14 (fourteen) days. 2 each 5  . atorvastatin (LIPITOR) 10 MG tablet TAKE 1 TABLET (10 MG TOTAL) BY MOUTH DAILY. 90 tablet 3  . Blood Glucose Monitoring Suppl (FREESTYLE FREEDOM LITE) w/Device KIT Use to check blood sugars 3 times daily. Dx code: E11.9 1 each 1  . carvedilol (COREG) 12.5 MG tablet Take 1 tablet (12.5 mg total) by mouth 2 (two) times daily with a meal. 60 tablet 8  . clonazePAM (KLONOPIN) 0.5 MG tablet Take 1 tablet (0.5 mg total) by mouth at bedtime. 30 tablet 5  . DULoxetine (CYMBALTA) 60 MG capsule Take 2 capsules (120 mg total) by mouth daily. (Patient taking differently: Take 60 mg by mouth 2 (two) times daily. ) 60 capsule 8  . fenofibrate micronized (LOFIBRA) 134 MG capsule TAKE 1 CAPSULE (134 MG TOTAL) BY MOUTH DAILY BEFORE BREAKFAST. 30 capsule 3  . gabapentin (NEURONTIN) 600 MG tablet Take 1 tablet (600 mg total) by mouth 3 (three) times daily. 90 tablet 5  . glucose blood (ACCU-CHEK GUIDE) test strip Use to test blood sugar three times daily 300 each 5  . Lancets (FREESTYLE) lancets Use to check blood sugar 3 times daily. Dx code E11.9 100 each 12  . levETIRAcetam (KEPPRA) 750  MG tablet Take 1 tablet (750 mg total) by mouth 2 (two) times daily. 60 tablet 11  . metFORMIN (GLUCOPHAGE) 1000 MG tablet TAKE 1 TABLET BY MOUTH TWICE DAILY WITH A MEAL 60 tablet 6  . omeprazole (PRILOSEC) 40 MG capsule Take 1 capsule (40 mg total) by mouth 2 (two) times daily before a meal. 180 capsule 1   No current facility-administered medications on file prior to visit.  BP 132/88   Pulse 84   Temp 98.8 F (37.1 C) (Oral)   Resp 16   Ht 5' 9"  (1.753 m)   Wt 257 lb 9.6 oz (116.8 kg)   SpO2 98%   BMI 38.04 kg/m       Objective:   Physical Exam   General  Mental Status - Alert. General Appearance - Well groomed. Not in acute distress. Severe cough during interview.  Skin Rashes- No Rashes.  HEENT Head- Normal. Ear Auditory Canal - Left- Normal. Right - Normal.Tympanic Membrane- Left- Normal. Right- Normal. Eye Sclera/Conjunctiva- Left- Normal. Right- Normal. Nose & Sinuses Nasal Mucosa- Left-  Boggy and Congested. Right-  Boggy and  Congested.Bilateral no maxillary and no  frontal sinus pressure. Mouth & Throat Lips: Upper Lip- Normal: no dryness, cracking, pallor, cyanosis, or vesicular eruption. Lower Lip-Normal: no dryness, cracking, pallor, cyanosis or vesicular eruption. Buccal Mucosa- Bilateral- No Aphthous ulcers. Oropharynx- No Discharge or Erythema. Tonsils: Characteristics- Bilateral- No Erythema or Congestion. Size/Enlargement- Bilateral- No enlargement. Discharge- bilateral-None.  Neck Neck- Supple. No Masses.   Chest and Lung Exam Auscultation: Breath Sounds:-Clear even and unlabored but severe cough.   Cardiovascular Auscultation:Rythm- Regular, rate and rhythm. Murmurs & Other Heart Sounds:Ausculatation of the heart reveal- No Murmurs.  Lymphatic Head & Neck General Head & Neck Lymphatics: Bilateral: Description- No Localized lymphadenopathy.   Abdomen Inspection:-Inspection Normal.  Palpation/Perucssion: Palpation and Percussion of  the abdomen reveal- Non Tender, No Rebound tenderness, No rigidity(Guarding) and No Palpable abdominal masses.  Liver:-Normal.  Spleen:- Normal.   .      Assessment & Plan:  Your flu test was positive for type A.  Please start Tamiflu today.  With your very severe dry cough, I am prescribing benzonatate.  We will also prescribe a azithromycin antibiotic as I have concerned may have already developed early secondary infection.(Note history of Humira use.)  If you develop any wheezing, I am making albuterol inhaler available.  Prescription and Flonase for nasal congestion.  Please get chest x-ray, CBC and CMP today.  The workup other day did not reveal any cause for abdomen pain.  However I would still be careful and if you have any worsening abdomen pain particularly right lower quadrant pain then would be seen again in the emergency department.  Note also with flu there are sometimes secondary severe complications so if you feel a lot worse rather than gradual improvement then be seen in the ED as well.  Follow-up in 7 days or as needed.  Mackie Pai, PA-C

## 2017-09-15 NOTE — Telephone Encounter (Signed)
Would you please place flu test order again and resulted as positive type today.  I had to delete the order in order to close the chart last night.

## 2017-09-15 NOTE — Progress Notes (Signed)
Cardiology Office Note:    Date:  09/15/2017   ID:  James [name], DOB [birth year], MRN [id number]  PCP:  Colon Branch, MD  Cardiologist:  Jenne Campus, MD    Referring MD: Colon Branch, MD   Chief Complaint  Patient presents with  . 1 month follow up  Recovering after surgery  History of Present Illness:    James [name] is a 62 y.o. male whom I seen for evaluation before his elective gallbladder surgery.  Stress test was done which was negative for exercise-induced myocardial ischemia, echocardiogram was done which showed preserved left ventricular ejection fraction.  He did have surgery and apparently uneventful but he did have a rough time recovering.  Still complaining of having pain in the right upper quadrant of the abdomen.  He ended up being in the emergency room twice since the time of surgery on top of that he is complaining of having cold-like symptoms.  Denies have any chest pain tightness squeezing pressure burning chest.  The purpose of the visit today was to talk about modification of his risk factors for coronary artery disease which I initiated conversation about already.  However it looks like is not after the part to have this discussion today we briefly mentioned about the fact that him to take baby aspirin every single day we talked about the fact that his cholesterol need to be perfectly controlled and will try to call his primary care physician to get last fasting lipid profile.  We will talk about exercises on the regular basis but again in this kind of situation at this moment he is not up to the point to do that yet.  Therefore, I will see him back in my office in about 3 months or sooner to revisit all those issues  Past Medical History:  Diagnosis Date  . Acid reflux   . Arthritis   . Cholelithiasis   . Chronic headaches    on cymbalta  . Cirrhosis ([place])   . Colon polyps   . Complication of anesthesia    problems waking up from anesthesia  . Depression      on cymbalta  . Diabetes mellitus with neuropathy ([place])    borderline per primary MD  . Diverticulitis 03/2013  . Eczema   . Elevated LFTs   . Fatty liver   . History of kidney stones   . Hypertension   . Insomnia 04/26/2013  . Kidney stone   . Morbid obesity ([place])   . Neuromuscular disorder (HCC)    neuropathy  . Neuropathy   . Post-traumatic hydrocephalus    s/p shunts x 2 (first got infected )  . Psoriasis    sees Dr [name]  . Psoriatic arthritis ([place])   . 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 Sleep apnea    no CPAP     . Stomach ulcer   . Testosterone deficiency 04/28/2011    Past Surgical History:  Procedure Laterality Date  . BACK SURGERY  1980  . CHOLECYSTECTOMY  08/25/2017   laproscopic   . CHOLECYSTECTOMY N/A 08/25/2017   Procedure: LAPAROSCOPIC CHOLECYSTECTOMY WITH INTRAOPERATIVE CHOLANGIOGRAM;  Surgeon: [name], MD;  Location: [facility];  Service: General;  Laterality: N/A;  . SHOULDER SURGERY Left 2010  . TOE SURGERY Left 2018  . TOTAL HIP ARTHROPLASTY Left 2011  . VENTRICULOPERITONEAL SHUNT  2007   x2    Current Medications: Current Meds  Medication  Sig  . Adalimumab (HUMIRA PEN) 40 MG/0.8ML PNKT Inject 0.8 mLs into the skin every 14 (fourteen) days.  Marland Kitchen atorvastatin (LIPITOR) 10 MG tablet TAKE 1 TABLET (10 MG TOTAL) BY MOUTH DAILY.  Marland Kitchen Blood Glucose Monitoring Suppl (FREESTYLE FREEDOM LITE) w/Device KIT Use to check blood sugars 3 times daily. Dx code: E11.9  . carvedilol (COREG) 12.5 MG tablet Take 1 tablet (12.5 mg total) by mouth 2 (two) times daily with a meal.  . clonazePAM (KLONOPIN) 0.5 MG tablet Take 1 tablet (0.5 mg total) by mouth at bedtime.  . DULoxetine (CYMBALTA) 60 MG capsule Take 2 capsules (120 mg total) by mouth daily. (Patient taking differently: Take 60 mg by mouth 2 (two) times daily. )  . fenofibrate micronized (LOFIBRA) 134 MG capsule TAKE 1 CAPSULE (134 MG TOTAL) BY MOUTH DAILY BEFORE  BREAKFAST.  Marland Kitchen gabapentin (NEURONTIN) 600 MG tablet Take 1 tablet (600 mg total) by mouth 3 (three) times daily.  Marland Kitchen glimepiride (AMARYL) 1 MG tablet Take 1 tablet (1 mg total) by mouth daily before supper.  Marland Kitchen glucose blood (ACCU-CHEK GUIDE) test strip Use to test blood sugar three times daily  . Lancets (FREESTYLE) lancets Use to check blood sugar 3 times daily. Dx code E11.9  . levETIRAcetam (KEPPRA) 750 MG tablet Take 1 tablet (750 mg total) by mouth 2 (two) times daily.  . metFORMIN (GLUCOPHAGE) 1000 MG tablet TAKE 1 TABLET BY MOUTH TWICE DAILY WITH A MEAL  . omeprazole (PRILOSEC) 40 MG capsule Take 1 capsule (40 mg total) by mouth 2 (two) times daily before a meal.  . TRULICITY 1.5 OI/7.8MV SOPN INJECT THE CONTENTS OF ONE PEN ONCE PER WEEK     Allergies:   Hydrocodone-homatropine; Morphine and related; and Sulfa drugs cross reactors   Social History   Socioeconomic History  . Marital status: Married    Spouse name: [name]  . Number of children: 2  . Years of education: None  . Highest education level: None  Social Needs  . Financial resource strain: None  . Food insecurity - worry: None  . Food insecurity - inability: None  . Transportation needs - medical: None  . Transportation needs - non-medical: None  Occupational History  . Occupation: disable   Tobacco Use  . Smoking status: Never Smoker  . Smokeless tobacco: Never Used  Substance and Sexual Activity  . Alcohol use: No  . Drug use: No  . Sexual activity: Yes    Partners: Female  Other Topics Concern  . 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     Family History: The patient's family history includes Down syndrome in his son; Healthy in his mother; Heart disease in his brother; Lung cancer in his father; Other in his brother. There is no history of Diabetes,  Prostate cancer, or Colon cancer. ROS:   Please see the history of present illness.    All 14 point review of systems negative except as described per history of present illness  EKGs/Labs/Other Studies Reviewed:      Recent Labs: 09/12/2017: ALT 38; BUN 22; Creatinine, Ser 1.59; Hemoglobin 14.2; Platelets 174; Potassium 4.2; Sodium 136  Recent Lipid Panel    Component Value Date/Time   CHOL 164 07/15/2017 1112   TRIG 212.0 (H) 07/15/2017 1112   HDL 39.40 07/15/2017 1112   CHOLHDL  4 07/15/2017 1112   VLDL 42.4 (H) 07/15/2017 1112   LDLCALC 137 (H) 09/07/2016 1025   LDLDIRECT 101.0 07/15/2017 1112    Physical Exam:    VS:  BP 136/84   Pulse 86   Ht 5' 9"  (1.753 m)   Wt 256 lb (116.1 kg)   SpO2 97%   BMI 37.80 kg/m     Wt Readings from Last 3 Encounters:  09/15/17 256 lb (116.1 kg)  09/12/17 255 lb (115.7 kg)  09/09/17 256 lb (116.1 kg)     GEN:  Well nourished, well developed in no acute distress HEENT: Normal NECK: No JVD; No carotid bruits LYMPHATICS: No lymphadenopathy CARDIAC: RRR, no murmurs, no rubs, no gallops RESPIRATORY:  Clear to auscultation without rales, wheezing or rhonchi  ABDOMEN: Soft, non-tender, non-distended MUSCULOSKELETAL:  No edema; No deformity  SKIN: Warm and dry LOWER EXTREMITIES: no swelling NEUROLOGIC:  Alert and oriented x 3 PSYCHIATRIC:  Normal affect   ASSESSMENT:    1. Essential hypertension   2. Cirrhosis of liver without ascites, unspecified hepatic cirrhosis type (HCC)   3. Gallstones   4. Type 2 diabetes mellitus with stage 2 chronic kidney disease, without long-term current use of insulin (HCC)    PLAN:    In order of problems listed above:  1. Essential hypertension: Blood pressure well controlled continue present medications. 2. Cirrhosis of the liver: Followed by internal medicine team and GI 3. Gallstones status post cholecystectomy.  Rough time recovering. 4. Diabetes: He said since the time of surgery he gets  some difficult time controlling his diabetes.  That will be followed by internal medicine team.   Medication Adjustments/Labs and Tests Ordered: Current medicines are reviewed at length with the patient today.  Concerns regarding medicines are outlined above.  No orders of the defined types were placed in this encounter.  Medication changes: No orders of the defined types were placed in this encounter.   Signed, Park Liter, MD, [facility] 09/15/2017 10:45 AM    [place]

## 2017-09-15 NOTE — Patient Instructions (Signed)

## 2017-09-15 NOTE — Patient Instructions (Addendum)
Your flu test was positive for type A.  Please start Tamiflu today.  With your very severe dry cough, I am prescribing benzonatate.  We will also prescribe a azithromycin antibiotic as I have concerned may have already developed early secondary infection.(Note history of Humira use.)  If you develop any wheezing, I am making albuterol inhaler available.  Prescription and Flonase for nasal congestion.  Please get chest x-ray, CBC and CMP today.  The workup other day did not reveal any cause for abdomen pain.  However I would still be careful and if you have any worsening abdomen pain particularly right lower quadrant pain then would be seen again in the emergency department.  Note also with flu there are sometimes secondary severe complications so if you feel a lot worse rather than gradual improvement then be seen in the ED as well.  Follow-up in 7 days or as needed.  Call downstairs to our pharmacy benzonatate 200 mg 3 times daily as needed cough #30 Also call down the standard azithromycin 5-day pack.  Epic  Error sending some prescription electronically.

## 2017-09-16 ENCOUNTER — Ambulatory Visit: Payer: 59 | Admitting: Nurse Practitioner

## 2017-09-16 LAB — COMPREHENSIVE METABOLIC PANEL
ALBUMIN: 4.5 g/dL (ref 3.5–5.2)
ALT: 49 U/L (ref 0–53)
AST: 57 U/L — ABNORMAL HIGH (ref 0–37)
Alkaline Phosphatase: 76 U/L (ref 39–117)
BUN: 17 mg/dL (ref 6–23)
CALCIUM: 10 mg/dL (ref 8.4–10.5)
CHLORIDE: 104 meq/L (ref 96–112)
CO2: 24 meq/L (ref 19–32)
Creatinine, Ser: 1.44 mg/dL (ref 0.40–1.50)
GFR: 53.27 mL/min — AB (ref >60.00)
Glucose, Bld: 148 mg/dL — ABNORMAL HIGH (ref 70–99)
POTASSIUM: 4.5 meq/L (ref 3.5–5.1)
Sodium: 139 mEq/L (ref 135–145)
Total Bilirubin: 0.5 mg/dL (ref 0.2–1.2)
Total Protein: 8.3 g/dL (ref 6.0–8.3)

## 2017-09-16 LAB — POC INFLUENZA A&B (BINAX/QUICKVUE)
INFLUENZA A, POC: POSITIVE — AB
Influenza B, POC: NEGATIVE

## 2017-09-16 NOTE — Addendum Note (Signed)
Addended by: Hinton Dyer on: 09/16/2017 09:11 AM   Modules accepted: Orders

## 2017-09-17 DIAGNOSIS — K760 Fatty (change of) liver, not elsewhere classified: Secondary | ICD-10-CM | POA: Diagnosis not present

## 2017-09-17 DIAGNOSIS — R630 Anorexia: Secondary | ICD-10-CM | POA: Diagnosis not present

## 2017-09-17 DIAGNOSIS — R531 Weakness: Secondary | ICD-10-CM | POA: Diagnosis not present

## 2017-09-17 DIAGNOSIS — R404 Transient alteration of awareness: Secondary | ICD-10-CM | POA: Diagnosis not present

## 2017-09-17 DIAGNOSIS — R110 Nausea: Secondary | ICD-10-CM | POA: Diagnosis not present

## 2017-09-17 DIAGNOSIS — J111 Influenza due to unidentified influenza virus with other respiratory manifestations: Secondary | ICD-10-CM | POA: Diagnosis not present

## 2017-09-17 DIAGNOSIS — Z01419 Encounter for gynecological examination (general) (routine) without abnormal findings: Secondary | ICD-10-CM | POA: Diagnosis not present

## 2017-09-17 DIAGNOSIS — J101 Influenza due to other identified influenza virus with other respiratory manifestations: Secondary | ICD-10-CM | POA: Diagnosis not present

## 2017-09-17 DIAGNOSIS — R6883 Chills (without fever): Secondary | ICD-10-CM | POA: Diagnosis not present

## 2017-09-17 DIAGNOSIS — R1011 Right upper quadrant pain: Secondary | ICD-10-CM | POA: Diagnosis not present

## 2017-09-18 DIAGNOSIS — K219 Gastro-esophageal reflux disease without esophagitis: Secondary | ICD-10-CM | POA: Diagnosis not present

## 2017-09-18 DIAGNOSIS — Z794 Long term (current) use of insulin: Secondary | ICD-10-CM | POA: Diagnosis not present

## 2017-09-18 DIAGNOSIS — E119 Type 2 diabetes mellitus without complications: Secondary | ICD-10-CM | POA: Diagnosis not present

## 2017-09-18 DIAGNOSIS — J111 Influenza due to unidentified influenza virus with other respiratory manifestations: Secondary | ICD-10-CM | POA: Diagnosis not present

## 2017-09-18 DIAGNOSIS — E785 Hyperlipidemia, unspecified: Secondary | ICD-10-CM | POA: Diagnosis not present

## 2017-09-18 DIAGNOSIS — J101 Influenza due to other identified influenza virus with other respiratory manifestations: Secondary | ICD-10-CM | POA: Diagnosis not present

## 2017-09-18 DIAGNOSIS — K760 Fatty (change of) liver, not elsewhere classified: Secondary | ICD-10-CM | POA: Diagnosis not present

## 2017-09-18 DIAGNOSIS — R197 Diarrhea, unspecified: Secondary | ICD-10-CM | POA: Diagnosis not present

## 2017-09-18 DIAGNOSIS — R1011 Right upper quadrant pain: Secondary | ICD-10-CM | POA: Diagnosis not present

## 2017-09-18 DIAGNOSIS — E860 Dehydration: Secondary | ICD-10-CM | POA: Diagnosis not present

## 2017-09-18 DIAGNOSIS — R110 Nausea: Secondary | ICD-10-CM | POA: Diagnosis not present

## 2017-09-18 DIAGNOSIS — I10 Essential (primary) hypertension: Secondary | ICD-10-CM | POA: Diagnosis not present

## 2017-09-18 DIAGNOSIS — R630 Anorexia: Secondary | ICD-10-CM | POA: Diagnosis not present

## 2017-09-18 DIAGNOSIS — R509 Fever, unspecified: Secondary | ICD-10-CM | POA: Diagnosis not present

## 2017-09-19 DIAGNOSIS — J111 Influenza due to unidentified influenza virus with other respiratory manifestations: Secondary | ICD-10-CM | POA: Diagnosis not present

## 2017-09-19 DIAGNOSIS — E119 Type 2 diabetes mellitus without complications: Secondary | ICD-10-CM | POA: Diagnosis not present

## 2017-09-19 DIAGNOSIS — R110 Nausea: Secondary | ICD-10-CM | POA: Diagnosis not present

## 2017-09-19 DIAGNOSIS — J101 Influenza due to other identified influenza virus with other respiratory manifestations: Secondary | ICD-10-CM | POA: Diagnosis not present

## 2017-09-19 DIAGNOSIS — E860 Dehydration: Secondary | ICD-10-CM | POA: Diagnosis not present

## 2017-09-19 DIAGNOSIS — E785 Hyperlipidemia, unspecified: Secondary | ICD-10-CM | POA: Diagnosis not present

## 2017-09-19 DIAGNOSIS — K219 Gastro-esophageal reflux disease without esophagitis: Secondary | ICD-10-CM | POA: Diagnosis not present

## 2017-09-19 DIAGNOSIS — R197 Diarrhea, unspecified: Secondary | ICD-10-CM | POA: Diagnosis not present

## 2017-09-19 DIAGNOSIS — Z794 Long term (current) use of insulin: Secondary | ICD-10-CM | POA: Diagnosis not present

## 2017-09-19 DIAGNOSIS — R1011 Right upper quadrant pain: Secondary | ICD-10-CM | POA: Diagnosis not present

## 2017-09-19 DIAGNOSIS — R630 Anorexia: Secondary | ICD-10-CM | POA: Diagnosis not present

## 2017-09-19 DIAGNOSIS — I10 Essential (primary) hypertension: Secondary | ICD-10-CM | POA: Diagnosis not present

## 2017-09-20 DIAGNOSIS — E119 Type 2 diabetes mellitus without complications: Secondary | ICD-10-CM | POA: Diagnosis not present

## 2017-09-20 DIAGNOSIS — J101 Influenza due to other identified influenza virus with other respiratory manifestations: Secondary | ICD-10-CM | POA: Diagnosis not present

## 2017-09-20 DIAGNOSIS — R630 Anorexia: Secondary | ICD-10-CM | POA: Diagnosis not present

## 2017-09-20 DIAGNOSIS — R110 Nausea: Secondary | ICD-10-CM | POA: Diagnosis not present

## 2017-09-20 DIAGNOSIS — R1011 Right upper quadrant pain: Secondary | ICD-10-CM | POA: Diagnosis not present

## 2017-09-20 DIAGNOSIS — I10 Essential (primary) hypertension: Secondary | ICD-10-CM | POA: Diagnosis not present

## 2017-09-20 DIAGNOSIS — R51 Headache: Secondary | ICD-10-CM | POA: Diagnosis not present

## 2017-09-20 DIAGNOSIS — J111 Influenza due to unidentified influenza virus with other respiratory manifestations: Secondary | ICD-10-CM | POA: Diagnosis not present

## 2017-09-21 DIAGNOSIS — R110 Nausea: Secondary | ICD-10-CM | POA: Diagnosis not present

## 2017-09-21 DIAGNOSIS — J111 Influenza due to unidentified influenza virus with other respiratory manifestations: Secondary | ICD-10-CM | POA: Diagnosis not present

## 2017-09-21 DIAGNOSIS — R630 Anorexia: Secondary | ICD-10-CM | POA: Diagnosis not present

## 2017-09-21 DIAGNOSIS — R51 Headache: Secondary | ICD-10-CM | POA: Diagnosis not present

## 2017-09-21 DIAGNOSIS — J101 Influenza due to other identified influenza virus with other respiratory manifestations: Secondary | ICD-10-CM | POA: Diagnosis not present

## 2017-09-21 DIAGNOSIS — E119 Type 2 diabetes mellitus without complications: Secondary | ICD-10-CM | POA: Diagnosis not present

## 2017-09-21 DIAGNOSIS — R1011 Right upper quadrant pain: Secondary | ICD-10-CM | POA: Diagnosis not present

## 2017-09-21 DIAGNOSIS — I10 Essential (primary) hypertension: Secondary | ICD-10-CM | POA: Diagnosis not present

## 2017-09-21 MED ORDER — HYDROMORPHONE HCL 1 MG/ML IJ SOLN
.20 | INTRAMUSCULAR | Status: DC
Start: ? — End: 2017-09-21

## 2017-09-21 MED ORDER — GENERIC EXTERNAL MEDICATION
750.00 | Status: DC
Start: 2017-09-21 — End: 2017-09-21

## 2017-09-21 MED ORDER — GLUCOSE 40 % PO GEL
15.00 g | ORAL | Status: DC
Start: ? — End: 2017-09-21

## 2017-09-21 MED ORDER — HYDRALAZINE HCL 20 MG/ML IJ SOLN
10.00 | INTRAMUSCULAR | Status: DC
Start: ? — End: 2017-09-21

## 2017-09-21 MED ORDER — BENZONATATE 100 MG PO CAPS
100.00 | ORAL_CAPSULE | ORAL | Status: DC
Start: ? — End: 2017-09-21

## 2017-09-21 MED ORDER — GENERIC EXTERNAL MEDICATION
1.00 | Status: DC
Start: ? — End: 2017-09-21

## 2017-09-21 MED ORDER — ATORVASTATIN CALCIUM 10 MG PO TABS
10.00 | ORAL_TABLET | ORAL | Status: DC
Start: 2017-09-21 — End: 2017-09-21

## 2017-09-21 MED ORDER — DULOXETINE HCL 60 MG PO CPEP
120.00 | ORAL_CAPSULE | ORAL | Status: DC
Start: 2017-09-22 — End: 2017-09-21

## 2017-09-21 MED ORDER — GABAPENTIN 300 MG PO CAPS
600.00 | ORAL_CAPSULE | ORAL | Status: DC
Start: 2017-09-21 — End: 2017-09-21

## 2017-09-21 MED ORDER — CLONAZEPAM 0.5 MG PO TABS
0.50 | ORAL_TABLET | ORAL | Status: DC
Start: 2017-09-21 — End: 2017-09-21

## 2017-09-21 MED ORDER — DEXTROSE 50 % IV SOLN
12.00 g | INTRAVENOUS | Status: DC
Start: ? — End: 2017-09-21

## 2017-09-21 MED ORDER — ONDANSETRON HCL 4 MG/2ML IJ SOLN
4.00 | INTRAMUSCULAR | Status: DC
Start: ? — End: 2017-09-21

## 2017-09-21 MED ORDER — INSULIN LISPRO 100 UNIT/ML ~~LOC~~ SOLN
1.00 | SUBCUTANEOUS | Status: DC
Start: 2017-09-21 — End: 2017-09-21

## 2017-09-21 MED ORDER — PANTOPRAZOLE SODIUM 40 MG PO TBEC
40.00 | DELAYED_RELEASE_TABLET | ORAL | Status: DC
Start: 2017-09-22 — End: 2017-09-21

## 2017-09-21 MED ORDER — HEPARIN SODIUM (PORCINE) 5000 UNIT/ML IJ SOLN
5000.00 | INTRAMUSCULAR | Status: DC
Start: 2017-09-21 — End: 2017-09-21

## 2017-09-21 MED ORDER — SODIUM CHLORIDE 0.9 % IV SOLN
INTRAVENOUS | Status: DC
Start: ? — End: 2017-09-21

## 2017-09-21 MED ORDER — TRAMADOL HCL 50 MG PO TABS
50.00 | ORAL_TABLET | ORAL | Status: DC
Start: ? — End: 2017-09-21

## 2017-09-21 MED ORDER — ACETAMINOPHEN 325 MG PO TABS
650.00 | ORAL_TABLET | ORAL | Status: DC
Start: ? — End: 2017-09-21

## 2017-09-21 MED ORDER — CARVEDILOL 12.5 MG PO TABS
12.50 | ORAL_TABLET | ORAL | Status: DC
Start: 2017-09-21 — End: 2017-09-21

## 2017-09-22 ENCOUNTER — Ambulatory Visit: Payer: 59 | Admitting: Physician Assistant

## 2017-09-22 MED FILL — GABAPENTIN 600 MG TAB: 600 | 30 days supply | Qty: 90 | Fill #2

## 2017-09-25 ENCOUNTER — Emergency Department (HOSPITAL_COMMUNITY): Payer: 59

## 2017-09-25 ENCOUNTER — Inpatient Hospital Stay (HOSPITAL_COMMUNITY)
Admission: EM | Admit: 2017-09-25 | Discharge: 2017-09-28 | DRG: 392 | Disposition: A | Discharge status: Home / Self Care | Payer: 59 | Attending: Internal Medicine | Admitting: Internal Medicine

## 2017-09-25 ENCOUNTER — Encounter (HOSPITAL_COMMUNITY): Payer: Self-pay | Admitting: Emergency Medicine

## 2017-09-25 DIAGNOSIS — M1990 Unspecified osteoarthritis, unspecified site: Secondary | ICD-10-CM | POA: Diagnosis present

## 2017-09-25 DIAGNOSIS — G4733 Obstructive sleep apnea (adult) (pediatric): Secondary | ICD-10-CM | POA: Diagnosis present

## 2017-09-25 DIAGNOSIS — T383X5A Adverse effect of insulin and oral hypoglycemic [antidiabetic] drugs, initial encounter: Secondary | ICD-10-CM | POA: Diagnosis not present

## 2017-09-25 DIAGNOSIS — R52 Pain, unspecified: Secondary | ICD-10-CM

## 2017-09-25 DIAGNOSIS — K7581 Nonalcoholic steatohepatitis (NASH): Secondary | ICD-10-CM | POA: Diagnosis present

## 2017-09-25 DIAGNOSIS — E119 Type 2 diabetes mellitus without complications: Secondary | ICD-10-CM

## 2017-09-25 DIAGNOSIS — G913 Post-traumatic hydrocephalus, unspecified: Secondary | ICD-10-CM | POA: Diagnosis not present

## 2017-09-25 DIAGNOSIS — R197 Diarrhea, unspecified: Secondary | ICD-10-CM | POA: Diagnosis not present

## 2017-09-25 DIAGNOSIS — E785 Hyperlipidemia, unspecified: Secondary | ICD-10-CM | POA: Diagnosis not present

## 2017-09-25 DIAGNOSIS — Z86010 Personal history of colonic polyps: Secondary | ICD-10-CM

## 2017-09-25 DIAGNOSIS — E876 Hypokalemia: Secondary | ICD-10-CM | POA: Diagnosis present

## 2017-09-25 DIAGNOSIS — F419 Anxiety disorder, unspecified: Secondary | ICD-10-CM | POA: Diagnosis present

## 2017-09-25 DIAGNOSIS — E1122 Type 2 diabetes mellitus with diabetic chronic kidney disease: Secondary | ICD-10-CM

## 2017-09-25 DIAGNOSIS — I10 Essential (primary) hypertension: Secondary | ICD-10-CM | POA: Diagnosis present

## 2017-09-25 DIAGNOSIS — K219 Gastro-esophageal reflux disease without esophagitis: Secondary | ICD-10-CM | POA: Diagnosis present

## 2017-09-25 DIAGNOSIS — E1140 Type 2 diabetes mellitus with diabetic neuropathy, unspecified: Secondary | ICD-10-CM | POA: Diagnosis not present

## 2017-09-25 DIAGNOSIS — R05 Cough: Secondary | ICD-10-CM | POA: Diagnosis not present

## 2017-09-25 DIAGNOSIS — Z885 Allergy status to narcotic agent status: Secondary | ICD-10-CM

## 2017-09-25 DIAGNOSIS — Z87442 Personal history of urinary calculi: Secondary | ICD-10-CM

## 2017-09-25 DIAGNOSIS — K7460 Unspecified cirrhosis of liver: Secondary | ICD-10-CM | POA: Diagnosis present

## 2017-09-25 DIAGNOSIS — R112 Nausea with vomiting, unspecified: Secondary | ICD-10-CM | POA: Diagnosis not present

## 2017-09-25 DIAGNOSIS — Z79899 Other long term (current) drug therapy: Secondary | ICD-10-CM

## 2017-09-25 DIAGNOSIS — Z8711 Personal history of peptic ulcer disease: Secondary | ICD-10-CM

## 2017-09-25 DIAGNOSIS — L4050 Arthropathic psoriasis, unspecified: Secondary | ICD-10-CM | POA: Diagnosis present

## 2017-09-25 DIAGNOSIS — K591 Functional diarrhea: Secondary | ICD-10-CM | POA: Diagnosis not present

## 2017-09-25 DIAGNOSIS — Z7984 Long term (current) use of oral hypoglycemic drugs: Secondary | ICD-10-CM

## 2017-09-25 DIAGNOSIS — N182 Chronic kidney disease, stage 2 (mild): Secondary | ICD-10-CM | POA: Diagnosis not present

## 2017-09-25 DIAGNOSIS — Z982 Presence of cerebrospinal fluid drainage device: Secondary | ICD-10-CM

## 2017-09-25 DIAGNOSIS — Z882 Allergy status to sulfonamides status: Secondary | ICD-10-CM

## 2017-09-25 DIAGNOSIS — Z8249 Family history of ischemic heart disease and other diseases of the circulatory system: Secondary | ICD-10-CM

## 2017-09-25 LAB — COMPREHENSIVE METABOLIC PANEL
ALBUMIN: 3.9 g/dL (ref 3.5–5.0)
ALK PHOS: 76 U/L (ref 38–126)
ALT: 38 U/L (ref 17–63)
AST: 34 U/L (ref 15–41)
Anion gap: 10 (ref 5–15)
BUN: 11 mg/dL (ref 6–20)
CALCIUM: 8.9 mg/dL (ref 8.9–10.3)
CO2: 21 mmol/L — AB (ref 22–32)
CREATININE: 1.27 mg/dL — AB (ref 0.61–1.24)
Chloride: 106 mmol/L (ref 101–111)
GFR calc Af Amer: >60 mL/min (ref >60)
GFR calc non Af Amer: >60 mL/min (ref >60)
GLUCOSE: 124 mg/dL — AB (ref 65–99)
Potassium: 3.9 mmol/L (ref 3.5–5.1)
SODIUM: 137 mmol/L (ref 135–145)
Total Bilirubin: 1.9 mg/dL — ABNORMAL HIGH (ref 0.3–1.2)
Total Protein: 7.7 g/dL (ref 6.5–8.1)

## 2017-09-25 LAB — URINALYSIS, ROUTINE W REFLEX MICROSCOPIC
BILIRUBIN URINE: NEGATIVE
Bacteria, UA: NONE SEEN
Glucose, UA: NEGATIVE mg/dL
Hgb urine dipstick: NEGATIVE
Ketones, ur: NEGATIVE mg/dL
Leukocytes, UA: NEGATIVE
Nitrite: NEGATIVE
Protein, ur: 30 mg/dL — AB
SPECIFIC GRAVITY, URINE: 1.026 (ref 1.005–1.030)
pH: 5 (ref 5.0–8.0)

## 2017-09-25 LAB — CBG MONITORING, ED: GLUCOSE-CAPILLARY: 70 mg/dL (ref 65–99)

## 2017-09-25 LAB — CBC
HCT: 41.5 % (ref 39.0–52.0)
HEMOGLOBIN: 14.4 g/dL (ref 13.0–17.0)
MCH: 30.6 pg (ref 26.0–34.0)
MCHC: 34.7 g/dL (ref 30.0–36.0)
MCV: 88.1 fL (ref 78.0–100.0)
PLATELETS: 205 10*3/uL (ref 150–400)
RBC: 4.71 MIL/uL (ref 4.22–5.81)
RDW: 14.5 % (ref 11.5–15.5)
WBC: 7.1 10*3/uL (ref 4.0–10.5)

## 2017-09-25 LAB — LIPASE, BLOOD: Lipase: 37 U/L (ref 11–51)

## 2017-09-25 MED ORDER — LEVETIRACETAM 750 MG PO TABS
750.0000 mg | ORAL_TABLET | Freq: Two times a day (BID) | ORAL | Status: DC
  Administered 2017-09-25 – 2017-09-28 (×6): 750 mg via ORAL
  Filled 2017-09-25 (×6): qty 1

## 2017-09-25 MED ORDER — ATORVASTATIN CALCIUM 10 MG PO TABS
10.0000 mg | ORAL_TABLET | Freq: Every day | ORAL | Status: DC
  Administered 2017-09-26 – 2017-09-28 (×3): 10 mg via ORAL
  Filled 2017-09-25 (×3): qty 1

## 2017-09-25 MED ORDER — ACETAMINOPHEN 325 MG PO TABS
650.0000 mg | ORAL_TABLET | Freq: Four times a day (QID) | ORAL | Status: DC | PRN
  Administered 2017-09-26 – 2017-09-28 (×6): 650 mg via ORAL
  Filled 2017-09-25 (×7): qty 2

## 2017-09-25 MED ORDER — GABAPENTIN 600 MG PO TABS
600.0000 mg | ORAL_TABLET | Freq: Three times a day (TID) | ORAL | Status: DC
  Administered 2017-09-25 – 2017-09-28 (×8): 600 mg via ORAL
  Filled 2017-09-25 (×8): qty 1

## 2017-09-25 MED ORDER — ONDANSETRON HCL 4 MG/2ML IJ SOLN
4.0000 mg | Freq: Once | INTRAMUSCULAR | Status: AC
  Administered 2017-09-25: 4 mg via INTRAVENOUS
  Filled 2017-09-25: qty 2

## 2017-09-25 MED ORDER — ONDANSETRON 4 MG PO TBDP
4.0000 mg | ORAL_TABLET | Freq: Once | ORAL | Status: AC | PRN
  Administered 2017-09-25: 4 mg via ORAL
  Filled 2017-09-25: qty 1

## 2017-09-25 MED ORDER — ENOXAPARIN SODIUM 40 MG/0.4ML ~~LOC~~ SOLN
40.0000 mg | SUBCUTANEOUS | Status: DC
  Administered 2017-09-26 – 2017-09-28 (×3): 40 mg via SUBCUTANEOUS
  Filled 2017-09-25 (×3): qty 0.4

## 2017-09-25 MED ORDER — METRONIDAZOLE IN NACL 5-0.79 MG/ML-% IV SOLN
500.0000 mg | Freq: Three times a day (TID) | INTRAVENOUS | Status: DC
  Administered 2017-09-25 – 2017-09-28 (×8): 500 mg via INTRAVENOUS
  Filled 2017-09-25 (×9): qty 100

## 2017-09-25 MED ORDER — PNEUMOCOCCAL VAC POLYVALENT 25 MCG/0.5ML IJ INJ: 0.5000 mL | INJECTION | INTRAMUSCULAR | Status: DC | PRN

## 2017-09-25 MED ORDER — ACETAMINOPHEN 650 MG RE SUPP: 650.0000 mg | Freq: Four times a day (QID) | RECTAL | Status: DC | PRN

## 2017-09-25 MED ORDER — SODIUM CHLORIDE 0.9 % IV BOLUS
1000.0000 mL | Freq: Once | INTRAVENOUS | Status: AC
  Administered 2017-09-25: 1000 mL via INTRAVENOUS

## 2017-09-25 MED ORDER — FENOFIBRATE 160 MG PO TABS
160.0000 mg | ORAL_TABLET | Freq: Every day | ORAL | Status: DC
  Administered 2017-09-26 – 2017-09-28 (×3): 160 mg via ORAL
  Filled 2017-09-25 (×3): qty 1

## 2017-09-25 MED ORDER — LOPERAMIDE HCL 2 MG PO CAPS
2.0000 mg | ORAL_CAPSULE | ORAL | Status: DC | PRN
  Administered 2017-09-27: 2 mg via ORAL
  Filled 2017-09-25 (×2): qty 1

## 2017-09-25 MED ORDER — CLONAZEPAM 0.5 MG PO TABS
0.5000 mg | ORAL_TABLET | Freq: Every day | ORAL | Status: DC
  Administered 2017-09-25 – 2017-09-27 (×3): 0.5 mg via ORAL
  Filled 2017-09-25 (×3): qty 1

## 2017-09-25 MED ORDER — SODIUM CHLORIDE 0.9 % IV SOLN
INTRAVENOUS | Status: DC
  Administered 2017-09-25: via INTRAVENOUS

## 2017-09-25 MED ORDER — PANTOPRAZOLE SODIUM 40 MG PO TBEC
40.0000 mg | DELAYED_RELEASE_TABLET | Freq: Every day | ORAL | Status: DC
  Administered 2017-09-26 – 2017-09-28 (×3): 40 mg via ORAL
  Filled 2017-09-25 (×3): qty 1

## 2017-09-25 MED ORDER — DULOXETINE HCL 60 MG PO CPEP
60.0000 mg | ORAL_CAPSULE | Freq: Two times a day (BID) | ORAL | Status: DC
  Administered 2017-09-25 – 2017-09-28 (×6): 60 mg via ORAL
  Filled 2017-09-25 (×6): qty 1

## 2017-09-25 MED ORDER — CARVEDILOL 12.5 MG PO TABS
12.5000 mg | ORAL_TABLET | Freq: Two times a day (BID) | ORAL | Status: DC
  Administered 2017-09-26 – 2017-09-28 (×5): 12.5 mg via ORAL
  Filled 2017-09-25 (×5): qty 1

## 2017-09-25 MED ORDER — ACETAMINOPHEN 325 MG PO TABS
650.0000 mg | ORAL_TABLET | Freq: Once | ORAL | Status: AC
  Administered 2017-09-25: 650 mg via ORAL
  Filled 2017-09-25: qty 2

## 2017-09-25 MED ORDER — ONDANSETRON HCL 4 MG/2ML IJ SOLN: 4.0000 mg | Freq: Four times a day (QID) | INTRAMUSCULAR | Status: DC | PRN

## 2017-09-25 NOTE — ED Triage Notes (Signed)
Pt to ER for evaluation of diarrhea, nausea, and vomiting onset Wednesday. States hx of C Diff 4-5 years ago. States has received antibiotics over the last month for cholecystectomy and last week for concern for pneumonia. Pt reports very foul odor to stool. Reports too many episodes per day to count.

## 2017-09-25 NOTE — Progress Notes (Signed)
@  2300 Pt arrived to unit from ED. Admitted for possible C-diff. A&Ox4. Denies c/o pain. On Enteric precautions. Oriented to room and call bell.

## 2017-09-25 NOTE — ED Provider Notes (Signed)
[facility]ERGENCY DEPARTMENT Provider Note   CSN: [id number] Arrival date & time: 09/25/17  1225     History   Chief Complaint Chief Complaint  Patient presents with  . Abdominal Pain    HPI James [name] is a 62 y.o. male.  62 year old male with prior history of GERD, cirrhosis, diabetes, depression, psoriasis, and hypertension presents with complaint of nausea, vomiting, and profuse diarrhea.  Patient reports that he was recently admitted at [facility] for "severe influenza" and was hospitalized for "5 days."  His symptoms apparently improved and he was discharged on the 26th.  He returns today complaining of increased nausea, vomiting, and diarrhea over the last 36 hours.  Patient reports that he has been unable to keep anything down today at home.  Patient denies abdominal pain.  Patient denies recent fever in the last 48 hours.  Patient reports he has been using Zofran at home without improvement.  Patient with a prior history of C. difficile infection and feels that his symptoms today may be reflective of same.  Patient thinks that he was given antibiotics for a possible pneumonia while admitted earlier this week.  Care everywhere records reveal recent admission in [facility] from 3/23 through 3/26.  Patient was treated with Tamiflu for influenza.  Patient was given antiemetics for his persistent nausea and vomiting.  The history is provided by the patient and the spouse.  Emesis   This is a recurrent problem. The current episode started 2 days ago. The problem occurs more than 10 times per day. The problem has not changed since onset.There has been no fever. Associated symptoms include chills and a fever. Pertinent negatives include no abdominal pain.    Past Medical History:  Diagnosis Date  . Acid reflux   . Arthritis   . Cholelithiasis   . Chronic headaches    on cymbalta  . Cirrhosis ([place])   . Colon polyps   . Complication of anesthesia    problems waking up from anesthesia  . Depression    on cymbalta  . Diabetes mellitus with neuropathy ([place])    borderline per primary MD  . Diverticulitis 03/2013  . Eczema   . Elevated LFTs   . Fatty liver   . History of kidney stones   . Hypertension   . Insomnia 04/26/2013  . Kidney stone   . Morbid obesity ([place])   . Neuromuscular disorder (HCC)    neuropathy  . Neuropathy   . Post-traumatic hydrocephalus    s/p shunts x 2 (first got infected )  . Psoriasis    sees Dr [name]  . Psoriatic arthritis ([place])   . 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 Sleep apnea    no CPAP     . Stomach ulcer   . Testosterone deficiency 04/28/2011    Patient Active Problem List   Diagnosis Date Noted  . RUQ abdominal pain 07/20/2017  . Gallstones 07/20/2017  . Idiopathic intracranial hypertension 01/14/2017  . REM behavioral disorder 01/14/2017  . Liver cirrhosis secondary to NASH (nonalcoholic steatohepatitis) ([place]) 01/02/2016  . H/O craniotomy 05/07/2015  . OSA on CPAP 05/07/2015  . Annual physical exam 04/08/2015  . PCP NOTES >>> 04/08/2015  . Hypersomnia with sleep apnea 01/28/2015  . Severe obesity (BMI >= 40) ([place]) 01/28/2015  . Obstructive hydrocephalus 01/28/2015  . Chronic fatigue 01/28/2015  . Depression 09/04/2014  . OSA -- dx ~ 2012, cpap intolerant 09/04/2014  .  Insomnia 04/26/2013  . Sigmoid diverticulitis 04/25/2013  . Diabetes with neuropathy 04/25/2013  . Cirrhosis ([place]) 04/25/2013  . Presence of cerebrospinal fluid drainage device 07/28/2011  . Psoriatic arthritis ([place]) 04/28/2011  . Headache 04/28/2011  . GERD (gastroesophageal reflux disease) 04/28/2011  . Urinary retention 04/28/2011  . Hypertension 04/28/2011  . Testosterone deficiency 04/28/2011    Past Surgical History:  Procedure Laterality Date  . BACK SURGERY  1980  . CHOLECYSTECTOMY  08/25/2017   laproscopic   . CHOLECYSTECTOMY N/A 08/25/2017   Procedure:  LAPAROSCOPIC CHOLECYSTECTOMY WITH INTRAOPERATIVE CHOLANGIOGRAM;  Surgeon: [name], MD;  Location: [facility];  Service: General;  Laterality: N/A;  . SHOULDER SURGERY Left 2010  . TOE SURGERY Left 2018  . TOTAL HIP ARTHROPLASTY Left 2011  . VENTRICULOPERITONEAL SHUNT  2007   x2        Home Medications    Prior to Admission medications   Medication Sig Start Date End Date Taking? Authorizing Provider  Adalimumab (HUMIRA PEN) 40 MG/0.8ML PNKT Inject 0.8 mLs into the skin every 14 (fourteen) days. 02/09/17   Tresa Garter, MD  atorvastatin (LIPITOR) 10 MG tablet TAKE 1 TABLET (10 MG TOTAL) BY MOUTH DAILY. 08/17/17   Elayne Snare, MD  Blood Glucose Monitoring Suppl (FREESTYLE FREEDOM LITE) w/Device KIT Use to check blood sugars 3 times daily. Dx code: E11.9 10/29/16   Elayne Snare, MD  carvedilol (COREG) 12.5 MG tablet Take 1 tablet (12.5 mg total) by mouth 2 (two) times daily with a meal. 05/24/17   Colon Branch, MD  clonazePAM (KLONOPIN) 0.5 MG tablet Take 1 tablet (0.5 mg total) by mouth at bedtime. 09/09/17   Sater, Nanine Means, MD  DULoxetine (CYMBALTA) 60 MG capsule Take 2 capsules (120 mg total) by mouth daily. Patient taking differently: Take 60 mg by mouth 2 (two) times daily.  04/08/17   Colon Branch, MD  fenofibrate micronized (LOFIBRA) 134 MG capsule TAKE 1 CAPSULE (134 MG TOTAL) BY MOUTH DAILY BEFORE BREAKFAST. 07/07/17   Elayne Snare, MD  gabapentin (NEURONTIN) 600 MG tablet Take 1 tablet (600 mg total) by mouth 3 (three) times daily. 06/03/17   Colon Branch, MD  glimepiride (AMARYL) 1 MG tablet TAKE 1 TABLET (1 MG TOTAL) BY MOUTH DAILY BEFORE SUPPER. 09/15/17   Elayne Snare, MD  glucose blood (ACCU-CHEK GUIDE) test strip Use to test blood sugar three times daily 07/19/17   Elayne Snare, MD  Lancets (FREESTYLE) lancets Use to check blood sugar 3 times daily. Dx code E11.9 10/29/16   Elayne Snare, MD  levETIRAcetam (KEPPRA) 750 MG tablet Take 1 tablet (750 mg total) by mouth 2 (two) times  daily. 09/09/17   Sater, Nanine Means, MD  metFORMIN (GLUCOPHAGE) 1000 MG tablet TAKE 1 TABLET BY MOUTH TWICE DAILY WITH A MEAL 03/05/17   Elayne Snare, MD  omeprazole (PRILOSEC) 40 MG capsule Take 1 capsule (40 mg total) by mouth 2 (two) times daily before a meal. 07/20/17   Zehr, Laban Emperor, PA-C  oseltamivir (TAMIFLU) 75 MG capsule Take 1 capsule (75 mg total) by mouth 2 (two) times daily. 09/15/17   Saguier, Percell Miller, PA-C  TRULICITY 1.5 BR/8.3EN [facility] THE CONTENTS OF ONE PEN ONCE PER WEEK 09/15/17   Elayne Snare, MD    Family History Family History  Problem Relation Age of Onset  . Healthy Mother   . Lung cancer Father        alive, former smoker   . Heart disease Brother  MI age 30  . Other Brother        Murdered  . Down syndrome Son   . Diabetes Neg Hx   . Prostate cancer Neg Hx   . Colon cancer Neg Hx     Social History Social History   Tobacco Use  . Smoking status: Never Smoker  . Smokeless tobacco: Never Used  Substance Use Topics  . Alcohol use: No  . Drug use: No     Allergies   Hydrocodone-homatropine; Morphine and related; and Sulfa drugs cross reactors   Review of Systems Review of Systems  Constitutional: Positive for chills and fever.  Gastrointestinal: Positive for vomiting. Negative for abdominal pain.  All other systems reviewed and are negative.    Physical Exam Updated Vital Signs BP (!) 147/89   Pulse 67   Temp 98.6 F (37 C) (Oral)   Resp 17   Ht 5' 11"  (1.803 m)   Wt 115.7 kg (255 lb)   SpO2 100%   BMI 35.57 kg/m   Physical Exam  Constitutional: He is oriented to person, place, and time. He appears well-developed and well-nourished. No distress.  HENT:  Head: Normocephalic and atraumatic.  Mouth/Throat: Oropharynx is clear and moist.  Eyes: Pupils are equal, round, and reactive to light. Conjunctivae and EOM are normal.  Neck: Normal range of motion. Neck supple.  Cardiovascular: Normal rate, regular rhythm and normal heart  sounds.  Pulmonary/Chest: Effort normal and breath sounds normal. No respiratory distress.  Abdominal: Soft. He exhibits no distension. There is no tenderness.  Musculoskeletal: Normal range of motion. He exhibits no edema or deformity.  Neurological: He is alert and oriented to person, place, and time.  Skin: Skin is warm and dry.  Psychiatric: He has a normal mood and affect.  Nursing note and vitals reviewed.    ED Treatments / Results  Labs (all labs ordered are listed, but only abnormal results are displayed) Labs Reviewed  COMPREHENSIVE METABOLIC PANEL - Abnormal; Notable for the following components:      Result Value   CO2 21 (*)    Glucose, Bld 124 (*)    Creatinine, Ser 1.27 (*)    Total Bilirubin 1.9 (*)    All other components within normal limits  URINALYSIS, ROUTINE W REFLEX MICROSCOPIC - Abnormal; Notable for the following components:   Color, Urine AMBER (*)    APPearance HAZY (*)    Protein, ur 30 (*)    Squamous Epithelial / LPF 0-5 (*)    All other components within normal limits  C DIFFICILE QUICK SCREEN W PCR REFLEX  LIPASE, BLOOD  CBC  CBG MONITORING, ED    EKG None  Radiology Dg Chest Port 1 View  Result Date: 09/25/2017 CLINICAL DATA:  Cough. EXAM: PORTABLE CHEST 1 VIEW COMPARISON:  Radiographs of September 18, 2017. FINDINGS: Stable cardiomegaly. No pneumothorax or pleural effusion is noted. Left-sided ventriculoperitoneal shunt is unchanged. No acute pulmonary disease is noted. Harrington rods are again noted in the upper thoracic spine. IMPRESSION: No acute cardiopulmonary abnormality seen. Electronically Signed   By: Marijo Conception, M.D.   On: 09/25/2017 18:38   Dg Abd 2 Views  Result Date: 09/25/2017 CLINICAL DATA:  Diarrhea, nausea and vomiting EXAM: ABDOMEN - 2 VIEW COMPARISON:  None. FINDINGS: The bowel gas pattern is normal. There is no evidence of free air. No radio-opaque calculi or other significant radiographic abnormality is seen.  IMPRESSION: Negative. Electronically Signed   By: Cletus Gash.D.  On: 09/25/2017 19:06    Procedures Procedures (including critical care time)  Medications Ordered in ED Medications  ondansetron (ZOFRAN-ODT) disintegrating tablet 4 mg (4 mg Oral Given 09/25/17 1237)  sodium chloride 0.9 % bolus 1,000 mL (0 mLs Intravenous Stopped 09/25/17 1954)  ondansetron (ZOFRAN) injection 4 mg (4 mg Intravenous Given 09/25/17 1853)  acetaminophen (TYLENOL) tablet 650 mg (650 mg Oral Given 09/25/17 1839)  sodium chloride 0.9 % bolus 1,000 mL (1,000 mLs Intravenous New Bag/Given 09/25/17 2001)     Initial Impression / Assessment and Plan / ED Course  I have reviewed the triage vital signs and the nursing notes.  Pertinent labs & imaging results that were available during my care of the patient were reviewed by me and considered in my medical decision making (see chart for details).     2100 Patient reevaluation-  Patient continues to complain of persistent nausea.  Patient has been given the 2 L of IV fluids thus far.   MDM  Screen complete  Patient is presenting with complaints of nausea, vomiting, and diarrhea.  Screening labs do not suggest significant pathology.  C. difficile remains on the differential diagnosis.  Patient will be admitted for observation and continued IV fluids to the hospitalist service.   Final Clinical Impressions(s) / ED Diagnoses   Final diagnoses:  Non-intractable vomiting with nausea, unspecified vomiting type  Diarrhea, unspecified type    ED Discharge Orders    None       Valarie Merino, MD 09/25/17 2147

## 2017-09-25 NOTE — H&P (Addendum)
TRH H&P   Patient Demographics:    James [name], is a 62 y.o. male  MRN: [id number]   DOB - April 06, 1956  Admit Date - 09/25/2017  Outpatient Primary MD for the patient is Colon Branch, MD  Referring MD/NP/PA:   Dr. Francia Greaves  Outpatient Specialists:     Patient coming from: home  Chief Complaint  Patient presents with  . Abdominal Pain      HPI:    James [name]  is a 62 y.o. male, w hypertension, dm2, diabetic neuropathy, cirrhosis ([facility]), OSA, (not on cpap), VP shunt,  Psoriasis, apparently presents with c/o diarrhea, and nausea and vomitting.  Pt has had diarrhea "liquid stool" reminds him of C. Diff since Thursday.  Pt denies fever, chills, abd pain, constipation, brbpr, black stool.   In ED,  CXR  IMPRESSION: No acute cardiopulmonary abnormality seen.  Abdominal xray IMPRESSION: Negative.  Na 137, K 3.9, Glucose 124, Bun 11, Creatinine 1.27 Hco3 21 Ast 34, Alt 38 Alk phos 76, T. Bili 1.9  Wbc 7.1, Hgb 14.4, Plt 205  Urinalysis negative  Pt will be admitted for w/up of diarrhea, and nausea and vomitting.    Review of systems:    In addition to the HPI above,    No Fever-chills, No Headache, No changes with Vision or hearing, No problems swallowing food or Liquids, No Chest pain, Cough or Shortness of Breath, No Abdominal pain,  No Blood in stool or Urine, No dysuria, No new skin rashes or bruises, No new joints pains-aches,  No new weakness, tingling, numbness in any extremity, No recent weight gain or loss, No polyuria, polydypsia or polyphagia, No significant Mental Stressors.  A full 10 point Review of Systems was done, except as stated above, all other Review of Systems were negative.   With Past History of the following :    Past Medical History:  Diagnosis Date  . Acid reflux   . Arthritis   . Cholelithiasis   . Chronic headaches      on cymbalta  . Cirrhosis ([place])   . Colon polyps   . Complication of anesthesia    problems waking up from anesthesia  . Depression    on cymbalta  . Diabetes mellitus with neuropathy ([place])    borderline per primary MD  . Diverticulitis 03/2013  . Eczema   . Elevated LFTs   . Fatty liver   . History of kidney stones   . Hypertension   . Insomnia 04/26/2013  . Kidney stone   . Morbid obesity ([place])   . Neuromuscular disorder (HCC)    neuropathy  . Neuropathy   . Post-traumatic hydrocephalus    s/p shunts x 2 (first got infected )  . Psoriasis    sees Dr [name]  . Psoriatic arthritis ([place])   . 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   see's  Dr.Ortmann  . Sleep apnea    no CPAP     . Stomach ulcer   . Testosterone deficiency 04/28/2011      Past Surgical History:  Procedure Laterality Date  . BACK SURGERY  1980  . CHOLECYSTECTOMY  08/25/2017   laproscopic   . CHOLECYSTECTOMY N/A 08/25/2017   Procedure: LAPAROSCOPIC CHOLECYSTECTOMY WITH INTRAOPERATIVE CHOLANGIOGRAM;  Surgeon: [name], MD;  Location: [facility];  Service: General;  Laterality: N/A;  . SHOULDER SURGERY Left 2010  . TOE SURGERY Left 2018  . TOTAL HIP ARTHROPLASTY Left 2011  . VENTRICULOPERITONEAL SHUNT  2007   x2      Social History:     Social History   Tobacco Use  . Smoking status: Never Smoker  . Smokeless tobacco: Never Used  Substance Use Topics  . Alcohol use: No     Lives - at home  Mobility - walks by self   Family History :     Family History  Problem Relation Age of Onset  . Healthy Mother   . Lung cancer Father        alive, former smoker   . Heart disease Brother        MI age 5  . Other Brother        Murdered  . Down syndrome Son   . Diabetes Neg Hx   . Prostate cancer Neg Hx   . Colon cancer Neg Hx       Home Medications:   Prior to Admission medications   Medication Sig Start Date End Date Taking? Authorizing Provider  Adalimumab  (HUMIRA PEN) 40 MG/0.8ML PNKT Inject 0.8 mLs into the skin every 14 (fourteen) days. 02/09/17   Tresa Garter, MD  atorvastatin (LIPITOR) 10 MG tablet TAKE 1 TABLET (10 MG TOTAL) BY MOUTH DAILY. 08/17/17   Elayne Snare, MD  Blood Glucose Monitoring Suppl (FREESTYLE FREEDOM LITE) w/Device KIT Use to check blood sugars 3 times daily. Dx code: E11.9 10/29/16   Elayne Snare, MD  carvedilol (COREG) 12.5 MG tablet Take 1 tablet (12.5 mg total) by mouth 2 (two) times daily with a meal. 05/24/17   Colon Branch, MD  clonazePAM (KLONOPIN) 0.5 MG tablet Take 1 tablet (0.5 mg total) by mouth at bedtime. 09/09/17   Sater, Nanine Means, MD  DULoxetine (CYMBALTA) 60 MG capsule Take 2 capsules (120 mg total) by mouth daily. Patient taking differently: Take 60 mg by mouth 2 (two) times daily.  04/08/17   Colon Branch, MD  fenofibrate micronized (LOFIBRA) 134 MG capsule TAKE 1 CAPSULE (134 MG TOTAL) BY MOUTH DAILY BEFORE BREAKFAST. 07/07/17   Elayne Snare, MD  gabapentin (NEURONTIN) 600 MG tablet Take 1 tablet (600 mg total) by mouth 3 (three) times daily. 06/03/17   Colon Branch, MD  glimepiride (AMARYL) 1 MG tablet TAKE 1 TABLET (1 MG TOTAL) BY MOUTH DAILY BEFORE SUPPER. 09/15/17   Elayne Snare, MD  glucose blood (ACCU-CHEK GUIDE) test strip Use to test blood sugar three times daily 07/19/17   Elayne Snare, MD  Lancets (FREESTYLE) lancets Use to check blood sugar 3 times daily. Dx code E11.9 10/29/16   Elayne Snare, MD  levETIRAcetam (KEPPRA) 750 MG tablet Take 1 tablet (750 mg total) by mouth 2 (two) times daily. 09/09/17   Sater, Nanine Means, MD  metFORMIN (GLUCOPHAGE) 1000 MG tablet TAKE 1 TABLET BY MOUTH TWICE DAILY WITH A MEAL 03/05/17   Elayne Snare, MD  omeprazole (PRILOSEC) 40 MG capsule  Take 1 capsule (40 mg total) by mouth 2 (two) times daily before a meal. 07/20/17   Zehr, Laban Emperor, PA-C  oseltamivir (TAMIFLU) 75 MG capsule Take 1 capsule (75 mg total) by mouth 2 (two) times daily. 09/15/17   Saguier, Percell Miller, PA-C  TRULICITY 1.5  PJ/8.2NK [facility] THE CONTENTS OF ONE PEN ONCE PER WEEK 09/15/17   Elayne Snare, MD     Allergies:     Allergies  Allergen Reactions  . Hydrocodone-Homatropine Other (See Comments)    Depressed feeling  . Morphine And Related Other (See Comments)    Hallucinations, back in the 80s. States has taken vicodin before w/o problems   . Sulfa Drugs Cross Reactors Rash     Physical Exam:   Vitals  Blood pressure (!) 147/89, pulse 67, temperature 98.6 F (37 C), temperature source Oral, resp. rate 17, height _0  (1.803 m), weight 115.7 kg (255 lb), SpO2 100 %.   1. General  lying in bed in NAD,    2. Normal affect and insight, Not Suicidal or Homicidal, Awake Alert, Oriented X 3.  3. No F.N deficits, ALL C.Nerves Intact, Strength 5/5 all 4 extremities, Sensation intact all 4 extremities, Plantars down going.  4. Ears and Eyes appear Normal, Conjunctivae clear, PERRLA. Moist Oral Mucosa.  5. Supple Neck, No JVD, No cervical lymphadenopathy appriciated, No Carotid Bruits.  6. Symmetrical Chest wall movement, Good air movement bilaterally, CTAB.  7. RRR, No Gallops, Rubs or Murmurs, No Parasternal Heave.  8. Positive Bowel Sounds, Abdomen Soft, No tenderness, No organomegaly appriciated,No rebound -guarding or rigidity.  9.  No Cyanosis, Normal Skin Turgor, No Skin Rash or Bruise.  10. Good muscle tone,  joints appear normal , no effusions, Normal ROM.  11. No Palpable Lymph Nodes in Neck or Axillae     Data Review:    CBC Recent Labs  Lab 09/25/17 1251  WBC 7.1  HGB 14.4  HCT 41.5  PLT 205  MCV 88.1  MCH 30.6  MCHC 34.7  RDW 14.5   ------------------------------------------------------------------------------------------------------------------  Chemistries  Recent Labs  Lab 09/25/17 1251  NA 137  K 3.9  CL 106  CO2 21*  GLUCOSE 124*  BUN 11  CREATININE 1.27*  CALCIUM 8.9  AST 34  ALT 38  ALKPHOS 76  BILITOT 1.9*    ------------------------------------------------------------------------------------------------------------------ estimated creatinine clearance is 81.1 mL/min (A) (by C-G formula based on SCr of 1.27 mg/dL (H)). ------------------------------------------------------------------------------------------------------------------ No results for input(s): TSH, T4TOTAL, T3FREE, THYROIDAB in the last 72 hours.  Invalid input(s): FREET3  Coagulation profile No results for input(s): INR, PROTIME in the last 168 hours. ------------------------------------------------------------------------------------------------------------------- No results for input(s): DDIMER in the last 72 hours. -------------------------------------------------------------------------------------------------------------------  Cardiac Enzymes No results for input(s): CKMB, TROPONINI, MYOGLOBIN in the last 168 hours.  Invalid input(s): CK ------------------------------------------------------------------------------------------------------------------ No results found for: BNP   ---------------------------------------------------------------------------------------------------------------  Urinalysis    Component Value Date/Time   COLORURINE AMBER (A) 09/25/2017 1304   APPEARANCEUR HAZY (A) 09/25/2017 1304   LABSPEC 1.026 09/25/2017 1304   PHURINE 5.0 09/25/2017 1304   GLUCOSEU NEGATIVE 09/25/2017 1304   HGBUR NEGATIVE 09/25/2017 1304   BILIRUBINUR NEGATIVE 09/25/2017 1304   KETONESUR NEGATIVE 09/25/2017 1304   PROTEINUR 30 (A) 09/25/2017 1304   UROBILINOGEN 1.0 04/25/2013 1843   NITRITE NEGATIVE 09/25/2017 1304   LEUKOCYTESUR NEGATIVE 09/25/2017 1304    ----------------------------------------------------------------------------------------------------------------   Imaging Results:    Dg Chest Port 1 View  Result Date: 09/25/2017 CLINICAL DATA:  Cough. EXAM:  PORTABLE CHEST 1 VIEW COMPARISON:   Radiographs of September 18, 2017. FINDINGS: Stable cardiomegaly. No pneumothorax or pleural effusion is noted. Left-sided ventriculoperitoneal shunt is unchanged. No acute pulmonary disease is noted. Harrington rods are again noted in the upper thoracic spine. IMPRESSION: No acute cardiopulmonary abnormality seen. Electronically Signed   By: Marijo Conception, M.D.   On: 09/25/2017 18:38   Dg Abd 2 Views  Result Date: 09/25/2017 CLINICAL DATA:  Diarrhea, nausea and vomiting EXAM: ABDOMEN - 2 VIEW COMPARISON:  None. FINDINGS: The bowel gas pattern is normal. There is no evidence of free air. No radio-opaque calculi or other significant radiographic abnormality is seen. IMPRESSION: Negative. Electronically Signed   By: Ulyses Jarred M.D.   On: 09/25/2017 19:06       Assessment & Plan:    Principal Problem:   Diarrhea Active Problems:   Diabetes with neuropathy   Nausea & vomiting    Diarrhea STOP METFORMIN Check stool for GI pathogen panel Check stool for C. Diff NPO Hydrate with NS iv Start Flagyl 5104m iv tid Consider GI consultation in am   Nausea and vomitting ? Diabetic gastroparesis STOP TRULICITY Consider ruling out diabetic gastroparesis  Dm2 w neuropathy Cont Gabapentin fsbs ac and qhs, ISS  Hypertension Cont Carvedilol  Hyperlipidemia Cont fenofibrate Cont Lipitor 140mpo qhs  Chronic headaches Cont Keppra  Anxiety Cont Cymbalta Cont Clonazepam  Psoriasis HOLD off on Humira  DVT Prophylaxis Lovenox - SCDs   AM Labs Ordered, also please review Full Orders  Family Communication: Admission, patients condition and plan of care including tests being ordered have been discussed with the patient  who indicate understanding and agree with the plan and Code Status.  Code Status FULL CODE  Likely DC to  home  Condition GUARDED FULL CODE  Consults called: none  Admission status: observation  Time spent in minutes : 45   JaJani Gravel.D on 09/25/2017 at  9:30 PM  Between 7am to 7pm - Pager - [phone]. After 7pm go to [email or web address] - password [email or web address] - Office  [phone]

## 2017-09-26 ENCOUNTER — Encounter: Payer: Self-pay | Admitting: Internal Medicine

## 2017-09-26 LAB — COMPREHENSIVE METABOLIC PANEL
ALT: 30 U/L (ref 17–63)
ANION GAP: 10 (ref 5–15)
AST: 26 U/L (ref 15–41)
Albumin: 3.3 g/dL — ABNORMAL LOW (ref 3.5–5.0)
Alkaline Phosphatase: 62 U/L (ref 38–126)
BILIRUBIN TOTAL: 1.7 mg/dL — AB (ref 0.3–1.2)
BUN: 11 mg/dL (ref 6–20)
CO2: 22 mmol/L (ref 22–32)
Calcium: 8.3 mg/dL — ABNORMAL LOW (ref 8.9–10.3)
Chloride: 107 mmol/L (ref 101–111)
Creatinine, Ser: 1.2 mg/dL (ref 0.61–1.24)
GFR calc non Af Amer: >60 mL/min (ref >60)
Glucose, Bld: 65 mg/dL (ref 65–99)
POTASSIUM: 3.4 mmol/L — AB (ref 3.5–5.1)
Sodium: 139 mmol/L (ref 135–145)
TOTAL PROTEIN: 6.7 g/dL (ref 6.5–8.1)

## 2017-09-26 LAB — C DIFFICILE QUICK SCREEN W PCR REFLEX
C Diff antigen: NEGATIVE
C Diff interpretation: NOT DETECTED
C Diff toxin: NEGATIVE

## 2017-09-26 LAB — CBC
HEMATOCRIT: 37.6 % — AB (ref 39.0–52.0)
Hemoglobin: 12.8 g/dL — ABNORMAL LOW (ref 13.0–17.0)
MCH: 29.8 pg (ref 26.0–34.0)
MCHC: 34 g/dL (ref 30.0–36.0)
MCV: 87.6 fL (ref 78.0–100.0)
Platelets: 162 10*3/uL (ref 150–400)
RBC: 4.29 MIL/uL (ref 4.22–5.81)
RDW: 14.6 % (ref 11.5–15.5)
WBC: 6.1 10*3/uL (ref 4.0–10.5)

## 2017-09-26 LAB — GASTROINTESTINAL PANEL BY PCR, STOOL (REPLACES STOOL CULTURE)
ADENOVIRUS F40/41: NOT DETECTED
ASTROVIRUS: NOT DETECTED
CAMPYLOBACTER SPECIES: NOT DETECTED
CYCLOSPORA CAYETANENSIS: NOT DETECTED
Cryptosporidium: NOT DETECTED
ENTEROAGGREGATIVE E COLI (EAEC): NOT DETECTED
ENTEROPATHOGENIC E COLI (EPEC): NOT DETECTED
ENTEROTOXIGENIC E COLI (ETEC): NOT DETECTED
Entamoeba histolytica: NOT DETECTED
Giardia lamblia: NOT DETECTED
Norovirus GI/GII: NOT DETECTED
PLESIMONAS SHIGELLOIDES: NOT DETECTED
Rotavirus A: NOT DETECTED
Salmonella species: NOT DETECTED
Sapovirus (I, II, IV, and V): NOT DETECTED
Shiga like toxin producing E coli (STEC): NOT DETECTED
Shigella/Enteroinvasive E coli (EIEC): NOT DETECTED
VIBRIO CHOLERAE: NOT DETECTED
VIBRIO SPECIES: NOT DETECTED
Yersinia enterocolitica: NOT DETECTED

## 2017-09-26 LAB — TSH: TSH: 1.274 u[IU]/mL (ref 0.350–4.500)

## 2017-09-26 LAB — GLUCOSE, CAPILLARY: GLUCOSE-CAPILLARY: 76 mg/dL (ref 65–99)

## 2017-09-26 LAB — HIV ANTIBODY (ROUTINE TESTING W REFLEX): HIV Screen 4th Generation wRfx: NONREACTIVE

## 2017-09-26 MED ORDER — POTASSIUM CHLORIDE IN NACL 20-0.9 MEQ/L-% IV SOLN
INTRAVENOUS | Status: AC
  Administered 2017-09-26 – 2017-09-27 (×2): via INTRAVENOUS
  Filled 2017-09-26 (×2): qty 1000

## 2017-09-26 NOTE — Progress Notes (Signed)
Patient ID: James [name], male   DOB: July 07, 1955, 62 y.o.   MRN: [id number]                                                                PROGRESS NOTE                                                                                                                                                                                                             Patient Demographics:    James [name], is a 62 y.o. male, DOB - [birth year], AUQ:[id number]  Admit date - 09/25/2017   Admitting Physician Jani Gravel, MD  Outpatient Primary MD for the patient is Colon Branch, MD  LOS - 0  Outpatient Specialists:     Chief Complaint  Patient presents with  . Abdominal Pain       Brief Narrative   62 y.o. male, w hypertension, dm2, diabetic neuropathy, cirrhosis (NASH), OSA, (not on cpap), VP shunt,  Psoriasis, apparently presents with c/o diarrhea, and nausea and vomitting.  Pt has had diarrhea "liquid stool" reminds him of C. Diff since Thursday.  Pt denies fever, chills, abd pain, constipation, brbpr, black stool.   In ED,  CXR  IMPRESSION: No acute cardiopulmonary abnormality seen.  Abdominal xray IMPRESSION: Negative.  Na 137, K 3.9, Glucose 124, Bun 11, Creatinine 1.27 Hco3 21 Ast 34, Alt 38 Alk phos 76, T. Bili 1.9  Wbc 7.1, Hgb 14.4, Plt 205  Urinalysis negative  Pt will be admitted for w/up of diarrhea, and nausea and vomitting.      Subjective:    [name] today has had 2 liquid bm but this seems to be getting better.  Denies fever, chills, n/v, constipation, abd pain, brbpr, black stool  No headache, No chest pain, No new weakness tingling or numbness, No Cough - SOB.    Assessment  & Plan :    Principal Problem:   Diarrhea Active Problems:   Diabetes with neuropathy   Nausea & vomiting     Diarrhea (C. Diff negative) STOPPED METFORMIN on admission Check stool for GI pathogen panel Clear liquid diet and advance as tolerated Hydrate with NS iv Cont   Flagyl 530m iv tid Consider GI consultation in am if not improving, otherwise  Switch to PO  Flagyl and discharge if doing better.  Start Florastor   Hypokalemia Change IVF to NS with 20 meq KCL Check cmp in am  Nausea and vomitting ? Diabetic gastroparesis STOP TRULICITY on admission Consider ruling out diabetic gastroparesis  Dm2 w neuropathy Cont Gabapentin fsbs ac and qhs, ISS  Hypertension Cont Carvedilol  Hyperlipidemia Cont fenofibrate Cont Lipitor 66m po qhs  Chronic headaches Cont Keppra  Anxiety Cont Cymbalta Cont Clonazepam  Psoriasis HOLD off on Humira      Code Status :  FULL CODE  Family Communication  : w patient  Disposition Plan  : home  Barriers For Discharge :   Consults  :  none  Procedures  :   DVT Prophylaxis  :  Lovenox - - SCDs  Lab Results  Component Value Date   PLT 162 09/26/2017    Antibiotics  :  Flagyl 3/30=>  Anti-infectives (From admission, onward)   Start     Dose/Rate Route Frequency Ordered Stop   09/25/17 2330  metroNIDAZOLE (FLAGYL) IVPB 500 mg     500 mg 100 mL/hr over 60 Minutes Intravenous Every 8 hours 09/25/17 2251          Objective:   Vitals:   09/25/17 2211 09/25/17 2300 09/26/17 0450 09/26/17 0500  BP:  (!) 141/96 124/78   Pulse:  70 66   Resp:  18 18   Temp: 98.2 F (36.8 C) 98.2 F (36.8 C) 97.8 F (36.6 C)   TempSrc: Oral Oral    SpO2:  97% 97%   Weight:    112.4 kg (247 lb 12.8 oz)  Height:  _0  (1.803 m)      Wt Readings from Last 3 Encounters:  09/26/17 112.4 kg (247 lb 12.8 oz)  09/15/17 116.8 kg (257 lb 9.6 oz)  09/15/17 116.1 kg (256 lb)     Intake/Output Summary (Last 24 hours) at 09/26/2017 1155 Last data filed at 09/26/2017 0500 Gross per 24 hour  Intake 2798.31 ml  Output -  Net 2798.31 ml     Physical Exam  Awake Alert, Oriented X 3, No new F.N deficits, Normal affect [place].AT,PERRAL Supple Neck,No JVD, No cervical lymphadenopathy appriciated.    Symmetrical Chest wall movement, Good air movement bilaterally, CTAB RRR,No Gallops,Rubs or new Murmurs, No Parasternal Heave +ve B.Sounds, Abd Soft, No tenderness, No organomegaly appriciated, No rebound - guarding or rigidity. No Cyanosis, Clubbing or edema, No new Rash or bruise      Data Review:    CBC Recent Labs  Lab 09/25/17 1251 09/26/17 0351  WBC 7.1 6.1  HGB 14.4 12.8*  HCT 41.5 37.6*  PLT 205 162  MCV 88.1 87.6  MCH 30.6 29.8  MCHC 34.7 34.0  RDW 14.5 14.6    Chemistries  Recent Labs  Lab 09/25/17 1251 09/26/17 0351  NA 137 139  K 3.9 3.4*  CL 106 107  CO2 21* 22  GLUCOSE 124* 65  BUN 11 11  CREATININE 1.27* 1.20  CALCIUM 8.9 8.3*  AST 34 26  ALT 38 30  ALKPHOS 76 62  BILITOT 1.9* 1.7*   ------------------------------------------------------------------------------------------------------------------ No results for input(s): CHOL, HDL, LDLCALC, TRIG, CHOLHDL, LDLDIRECT in the last 72 hours.  Lab Results  Component Value Date   HGBA1C 7.3 (H) 07/15/2017   ------------------------------------------------------------------------------------------------------------------ Recent Labs    09/26/17 0351  TSH 1.274   ------------------------------------------------------------------------------------------------------------------ No results for input(s): VITAMINB12, FOLATE, FERRITIN, TIBC, IRON, RETICCTPCT in the last 72 hours.  Coagulation profile No results  for input(s): INR, PROTIME in the last 168 hours.  No results for input(s): DDIMER in the last 72 hours.  Cardiac Enzymes No results for input(s): CKMB, TROPONINI, MYOGLOBIN in the last 168 hours.  Invalid input(s): CK ------------------------------------------------------------------------------------------------------------------ No results found for: BNP  Inpatient Medications  Scheduled Meds: . atorvastatin  10 mg Oral Daily  . carvedilol  12.5 mg Oral BID WC  . clonazePAM  0.5  mg Oral QHS  . DULoxetine  60 mg Oral BID  . enoxaparin (LOVENOX) injection  40 mg Subcutaneous Q24H  . fenofibrate  160 mg Oral Daily  . gabapentin  600 mg Oral TID  . levETIRAcetam  750 mg Oral BID  . pantoprazole  40 mg Oral Daily   Continuous Infusions: . 0.9 % NaCl with KCl 20 mEq / L    . metronidazole 500 mg (09/26/17 0459)   PRN Meds:.acetaminophen **OR** acetaminophen, loperamide, ondansetron (ZOFRAN) IV, pneumococcal 23 valent vaccine  Micro Results Recent Results (from the past 240 hour(s))  C difficile quick scan w PCR reflex     Status: None   Collection Time: 09/25/17  9:23 PM  Result Value Ref Range Status   C Diff antigen NEGATIVE NEGATIVE Final   C Diff toxin NEGATIVE NEGATIVE Final   C Diff interpretation No C. difficile detected.  Final    Radiology Reports Dg Chest 1 View  Result Date: 09/12/2017 CLINICAL DATA:  62 year old male with history of ventriculoperitoneal shunt. EXAM: CHEST  1 VIEW COMPARISON:  Chest x-ray 10/04/2013. FINDINGS: Left-sided VP shunt tubing noted. Unfortunately, the lower half of this tubing projects over the spine and is largely obscured on today's examination. The upper half of the tubing is intact without obvious discontinuity. Lung volumes are normal. No consolidative airspace disease. No pleural effusions. No pneumothorax. No pulmonary nodule or mass noted. Pulmonary vasculature and the cardiomediastinal silhouette are within normal limits. Orthopedic fixation hardware in the upper thoracic and lower cervical spine. IMPRESSION: 1. No radiographic evidence of acute cardiopulmonary disease. 2. VP shunt tubing is poorly visualized, but visualized portions appear intact. Electronically Signed   By: Vinnie Langton M.D.   On: 09/12/2017 19:48   Dg Chest 2 View  Result Date: 09/15/2017 CLINICAL DATA:  Cough with flu like symptoms. EXAM: CHEST - 2 VIEW COMPARISON:  09/12/2017 FINDINGS: There is no focal parenchymal opacity. There is no pleural  effusion or pneumothorax. The heart and mediastinal contours are unremarkable. There is VP shunt catheter tubing again noted without discontinuity. There are posterior spinal fixation rods transfixing the cervicothoracic spine. IMPRESSION: No active cardiopulmonary disease. Electronically Signed   By: Kathreen Devoid   On: 09/15/2017 15:30   Ct Head Wo Contrast  Result Date: 09/12/2017 CLINICAL DATA:  Acute headache. History of sleep apnea, posttraumatic hydrocephalus, hypertension and diabetes. EXAM: CT HEAD WITHOUT CONTRAST TECHNIQUE: Contiguous axial images were obtained from the base of the skull through the vertex without intravenous contrast. COMPARISON:  CT from 11/12/2016 FINDINGS: Brain: Left frontal approach ventricular shunt crosses midline with tip redemonstrated over the right anterior limb of the internal capsule. Mild encephalomalacia along the shunt tract. No hydrocephalus. No acute intracranial hemorrhage, midline shift or edema. No large vascular territory infarct. Midline fourth ventricle and basal cisterns. Vascular: No hyperdense vessel or unexpected calcification. Skull: The ventricular shunt port is seen projecting over the left temporal skull. No acute osseous abnormality. Sinuses/Orbits: Minimal bilateral maxillary and ethmoid sinus mucosal thickening. Intact orbits and globes. Other: None IMPRESSION: Chronic stable appearance  of the brain with ventricular shunt in place. Stable ventricular size without hydrocephalus. Electronically Signed   By: Ashley Royalty M.D.   On: 09/12/2017 21:00   Ct Abdomen Pelvis W Contrast  Result Date: 09/12/2017 CLINICAL DATA:  Initial evaluation for acute right lower quadrant pain for 4 days, nausea. History of recent cholecystectomy. EXAM: CT ABDOMEN AND PELVIS WITH CONTRAST TECHNIQUE: Multidetector CT imaging of the abdomen and pelvis was performed using the standard protocol following bolus administration of intravenous contrast. CONTRAST:  168m  ISOVUE-300 IOPAMIDOL (ISOVUE-300) INJECTION 61% COMPARISON:  Prior CT from 09/03/2017. FINDINGS: Lower chest: Visualized lung bases are clear. Hepatobiliary: Mild diffuse hypoattenuation of the liver, consistent with steatosis. Nodularity of the patent contour compatible with underlying cirrhosis. No focal intrahepatic masses. Postoperative changes from recent cholecystectomy seen with small amount of fluid density and soft tissue stranding within the gallbladder fossa of, felt to be within normal limits for normal expected postoperative changes. This is relatively similar in appearance as compared to prior exam. No new collections. Trace intrahepatic biliary dilatation within the right hepatic lobe is stable, likely related to post cholecystectomy changes. No extrahepatic biliary dilatation. Pancreas: Pancreas within normal limits. Mild hazy soft tissue stranding adjacent to the uncinate process/pancreatic head most consistent with postoperative changes, stable. Spleen: Mild splenomegaly with the spleen measuring 15.6 cm in diameter, stable. Spleen otherwise unremarkable. Adrenals/Urinary Tract: Adrenal glands are normal. Kidneys equal size with symmetric enhancement. Few subcentimeter hypodensities noted, too small the characterize, but statistically likely reflects small cyst. Punctate nonobstructive right renal nephrolithiasis measuring up to 3 mm noted. No hydronephrosis. No focal enhancing renal mass. No hydroureter. Evaluation of the distal ureter somewhat limited by streak artifact from adjacent left hip arthroplasty. Bladder largely decompressed without definite acute abnormality. Mild circumferential bladder wall thickening most like related incomplete distension. Stomach/Bowel: Stomach within normal limits. No evidence for bowel obstruction. Appendix within normal limits. Colonic diverticulosis without evidence for acute diverticulitis. No acute inflammatory changes seen about the bowels.  Vascular/Lymphatic: Normal intravascular enhancement seen throughout the intra-abdominal aorta no aneurysm. Mesenteric vessels are patent proximally. Retroaortic left renal vein noted. Mild aortic atherosclerosis. No pathologically enlarged intra-abdominal or pelvic lymph nodes. Reproductive: Prostate within normal limits. Other: No free air identified. Small amount of free perihepatic fluid noted, which may be postoperative and/or related to cirrhosis. 2.6 cm focus of fat necrosis within the left lower quadrant noted, similar to multiple previous exams. Small paraumbilical fat containing hernia. Pos periumbilical postoperative stranding has largely resolved since previous. VP shunt catheter again noted with tip terminating in the right lower quadrant. Musculoskeletal: Left total hip arthroplasty. No acute osseus abnormality. No worrisome lytic or blastic osseous lesions. Chronic facet mediated grade 1 anterolisthesis of L4 on L5 noted. IMPRESSION: 1. Mild residual postoperative stranding within the gallbladder fossa of, within normal limits for normal expected postoperative changes from recent cholecystectomy. No complication identified. 2. No other acute intra-abdominal or pelvic process. 3. Normal appendix. 4. Trace free perihepatic fluid, which may be related to recent surgery and/or hepatic cirrhosis. 5. Mild splenomegaly. 6. Nonobstructive right renal nephrolithiasis. 7. Colonic diverticulosis without evidence for acute diverticulitis. 8. VP shunt catheter in place with tip in the right lower quadrant. Electronically Signed   By: BJeannine BogaM.D.   On: 09/12/2017 23:04   Ct Abdomen Pelvis W Contrast  Result Date: 09/03/2017 CLINICAL DATA:  Generalized acute abdominal pain, RIGHT upper quadrant pain, cholecystectomy last Wednesday, history of VP shunt, diabetes mellitus, cirrhosis, hypertension EXAM: CT  ABDOMEN AND PELVIS WITH CONTRAST TECHNIQUE: Multidetector CT imaging of the abdomen and pelvis  was performed using the standard protocol following bolus administration of intravenous contrast. Sagittal and coronal MPR images reconstructed from axial data set. CONTRAST:  75m ISOVUE-300 IOPAMIDOL (ISOVUE-300) INJECTION 61% IV. No oral contrast. COMPARISON:  07/17/2017 FINDINGS: Lower chest: Subpleural fat at inferior RIGHT hemithorax. Lung bases otherwise clear. Hepatobiliary: Gallbladder surgically absent with a small amount of fluid at gallbladder fossa. No definite defined fluid collection/abscess. Liver is slightly low in attenuation with diffusely nodular margins consistent with cirrhosis. No discrete hepatic mass lesion. Pancreas: Normal appearance Spleen: Normal appearance Adrenals/Urinary Tract: Adrenal glands, kidneys, ureters, and bladder normal appearance Stomach/Bowel: Normal appendix. Stomach and bowel loops normal appearance. Vascular/Lymphatic: Minimal atherosclerotic calcification aorta. Aorta normal caliber. Circumaortic LEFT renal vein. No adenopathy. Scattered normal sized retroperitoneal nodes. Reproductive: Upper normal prostate size. Seminal vesicles normal appearance. Other: Scattered beam hardening artifacts in pelvis from LEFT hip prosthesis. VP shunt tubing RIGHT abdomen. No free air. Minimal perihepatic free fluid which may be related to surgery or cirrhosis. No hernia. Mild stranding at umbilicus likely related to laparoscopic procedure. Musculoskeletal: Unremarkable IMPRESSION: Small amount of fluid at gallbladder fossa not unexpected post recent cholecystectomy. Minimal peripelvic fluid which could be related to surgery or cirrhotic liver. Otherwise negative exam. Electronically Signed   By: MLavonia DanaM.D.   On: 09/03/2017 12:03   Dg Chest Port 1 View  Result Date: 09/25/2017 CLINICAL DATA:  Cough. EXAM: PORTABLE CHEST 1 VIEW COMPARISON:  Radiographs of September 18, 2017. FINDINGS: Stable cardiomegaly. No pneumothorax or pleural effusion is noted. Left-sided  ventriculoperitoneal shunt is unchanged. No acute pulmonary disease is noted. Harrington rods are again noted in the upper thoracic spine. IMPRESSION: No acute cardiopulmonary abnormality seen. Electronically Signed   By: JMarijo Conception M.D.   On: 09/25/2017 18:38   Dg Abd 2 Views  Result Date: 09/25/2017 CLINICAL DATA:  Diarrhea, nausea and vomiting EXAM: ABDOMEN - 2 VIEW COMPARISON:  None. FINDINGS: The bowel gas pattern is normal. There is no evidence of free air. No radio-opaque calculi or other significant radiographic abnormality is seen. IMPRESSION: Negative. Electronically Signed   By: KUlyses JarredM.D.   On: 09/25/2017 19:06    Time Spent in minutes  30   JJani GravelM.D on 09/26/2017 at 11:55 AM  Between 7am to 7pm - Pager - [phone] After 7pm go to [email or web address] - password [email or web address] -  Office  [phone]

## 2017-09-27 ENCOUNTER — Inpatient Hospital Stay: Payer: 59 | Admitting: Internal Medicine

## 2017-09-27 ENCOUNTER — Other Ambulatory Visit: Payer: Self-pay | Admitting: Pharmacist

## 2017-09-27 DIAGNOSIS — M1990 Unspecified osteoarthritis, unspecified site: Secondary | ICD-10-CM | POA: Diagnosis present

## 2017-09-27 DIAGNOSIS — Z885 Allergy status to narcotic agent status: Secondary | ICD-10-CM | POA: Diagnosis not present

## 2017-09-27 DIAGNOSIS — R112 Nausea with vomiting, unspecified: Secondary | ICD-10-CM | POA: Diagnosis present

## 2017-09-27 DIAGNOSIS — Z8249 Family history of ischemic heart disease and other diseases of the circulatory system: Secondary | ICD-10-CM | POA: Diagnosis not present

## 2017-09-27 DIAGNOSIS — Z79899 Other long term (current) drug therapy: Secondary | ICD-10-CM | POA: Diagnosis not present

## 2017-09-27 DIAGNOSIS — K591 Functional diarrhea: Secondary | ICD-10-CM | POA: Diagnosis not present

## 2017-09-27 DIAGNOSIS — Z8711 Personal history of peptic ulcer disease: Secondary | ICD-10-CM | POA: Diagnosis not present

## 2017-09-27 DIAGNOSIS — G4733 Obstructive sleep apnea (adult) (pediatric): Secondary | ICD-10-CM | POA: Diagnosis present

## 2017-09-27 DIAGNOSIS — K7581 Nonalcoholic steatohepatitis (NASH): Secondary | ICD-10-CM | POA: Diagnosis present

## 2017-09-27 DIAGNOSIS — Z86010 Personal history of colonic polyps: Secondary | ICD-10-CM | POA: Diagnosis not present

## 2017-09-27 DIAGNOSIS — Z982 Presence of cerebrospinal fluid drainage device: Secondary | ICD-10-CM | POA: Diagnosis not present

## 2017-09-27 DIAGNOSIS — R197 Diarrhea, unspecified: Principal | ICD-10-CM

## 2017-09-27 DIAGNOSIS — E876 Hypokalemia: Secondary | ICD-10-CM | POA: Diagnosis present

## 2017-09-27 DIAGNOSIS — Z7984 Long term (current) use of oral hypoglycemic drugs: Secondary | ICD-10-CM | POA: Diagnosis not present

## 2017-09-27 DIAGNOSIS — Z882 Allergy status to sulfonamides status: Secondary | ICD-10-CM | POA: Diagnosis not present

## 2017-09-27 DIAGNOSIS — K219 Gastro-esophageal reflux disease without esophagitis: Secondary | ICD-10-CM | POA: Diagnosis present

## 2017-09-27 DIAGNOSIS — I10 Essential (primary) hypertension: Secondary | ICD-10-CM | POA: Diagnosis not present

## 2017-09-27 DIAGNOSIS — K7460 Unspecified cirrhosis of liver: Secondary | ICD-10-CM | POA: Diagnosis present

## 2017-09-27 DIAGNOSIS — E1122 Type 2 diabetes mellitus with diabetic chronic kidney disease: Secondary | ICD-10-CM | POA: Diagnosis not present

## 2017-09-27 DIAGNOSIS — Z87442 Personal history of urinary calculi: Secondary | ICD-10-CM | POA: Diagnosis not present

## 2017-09-27 DIAGNOSIS — L4050 Arthropathic psoriasis, unspecified: Secondary | ICD-10-CM | POA: Diagnosis present

## 2017-09-27 DIAGNOSIS — G913 Post-traumatic hydrocephalus, unspecified: Secondary | ICD-10-CM | POA: Diagnosis present

## 2017-09-27 DIAGNOSIS — F419 Anxiety disorder, unspecified: Secondary | ICD-10-CM | POA: Diagnosis present

## 2017-09-27 DIAGNOSIS — E785 Hyperlipidemia, unspecified: Secondary | ICD-10-CM | POA: Diagnosis present

## 2017-09-27 DIAGNOSIS — T383X5A Adverse effect of insulin and oral hypoglycemic [antidiabetic] drugs, initial encounter: Secondary | ICD-10-CM | POA: Diagnosis present

## 2017-09-27 DIAGNOSIS — E1140 Type 2 diabetes mellitus with diabetic neuropathy, unspecified: Secondary | ICD-10-CM | POA: Diagnosis present

## 2017-09-27 DIAGNOSIS — N182 Chronic kidney disease, stage 2 (mild): Secondary | ICD-10-CM | POA: Diagnosis not present

## 2017-09-27 LAB — CBC
HEMATOCRIT: 36.1 % — AB (ref 39.0–52.0)
Hemoglobin: 12.4 g/dL — ABNORMAL LOW (ref 13.0–17.0)
MCH: 30.3 pg (ref 26.0–34.0)
MCHC: 34.3 g/dL (ref 30.0–36.0)
MCV: 88.3 fL (ref 78.0–100.0)
Platelets: 145 10*3/uL — ABNORMAL LOW (ref 150–400)
RBC: 4.09 MIL/uL — ABNORMAL LOW (ref 4.22–5.81)
RDW: 15 % (ref 11.5–15.5)
WBC: 3.8 10*3/uL — ABNORMAL LOW (ref 4.0–10.5)

## 2017-09-27 LAB — COMPREHENSIVE METABOLIC PANEL
ALBUMIN: 3.3 g/dL — AB (ref 3.5–5.0)
ALT: 26 U/L (ref 17–63)
ANION GAP: 7 (ref 5–15)
AST: 22 U/L (ref 15–41)
Alkaline Phosphatase: 59 U/L (ref 38–126)
BILIRUBIN TOTAL: 1.3 mg/dL — AB (ref 0.3–1.2)
BUN: 8 mg/dL (ref 6–20)
CO2: 23 mmol/L (ref 22–32)
Calcium: 8.4 mg/dL — ABNORMAL LOW (ref 8.9–10.3)
Chloride: 110 mmol/L (ref 101–111)
Creatinine, Ser: 1.23 mg/dL (ref 0.61–1.24)
GFR calc non Af Amer: >60 mL/min (ref >60)
GLUCOSE: 97 mg/dL (ref 65–99)
POTASSIUM: 3.8 mmol/L (ref 3.5–5.1)
Sodium: 140 mmol/L (ref 135–145)
TOTAL PROTEIN: 6.4 g/dL — AB (ref 6.5–8.1)

## 2017-09-27 LAB — GLUCOSE, CAPILLARY
GLUCOSE-CAPILLARY: 99 mg/dL (ref 65–99)
Glucose-Capillary: 101 mg/dL — ABNORMAL HIGH (ref 65–99)
Glucose-Capillary: 182 mg/dL — ABNORMAL HIGH (ref 65–99)

## 2017-09-27 LAB — MAGNESIUM: Magnesium: 1.6 mg/dL — ABNORMAL LOW (ref 1.7–2.4)

## 2017-09-27 MED ORDER — INSULIN ASPART 100 UNIT/ML ~~LOC~~ SOLN
0.0000 [IU] | Freq: Three times a day (TID) | SUBCUTANEOUS | Status: DC
  Administered 2017-09-27: 3 [IU] via SUBCUTANEOUS
  Administered 2017-09-28: 2 [IU] via SUBCUTANEOUS

## 2017-09-27 MED ORDER — CHOLESTYRAMINE 4 G PO PACK
4.0000 g | PACK | Freq: Two times a day (BID) | ORAL | Status: DC
  Administered 2017-09-27 – 2017-09-28 (×3): 4 g via ORAL
  Filled 2017-09-27 (×4): qty 1

## 2017-09-27 MED ORDER — INSULIN ASPART 100 UNIT/ML ~~LOC~~ SOLN: 0.0000 [IU] | Freq: Every day | SUBCUTANEOUS | Status: DC

## 2017-09-27 MED ORDER — ADALIMUMAB 40 MG/0.8ML ~~LOC~~ AJKT: 0.8000 mL | AUTO-INJECTOR | SUBCUTANEOUS | 0 refills | Status: DC

## 2017-09-27 NOTE — Progress Notes (Signed)
PROGRESS NOTE    James [name]  LOV:[id number] DOB: Oct 29, 1955 DOA: 09/25/2017 PCP: Colon Branch, MD   Outpatient Specialists:     Brief Narrative:   James [name]  is a 62 y.o. male, w hypertension, dm2, diabetic neuropathy, cirrhosis (NASH), OSA, (not on cpap), VP shunt,  Psoriasis, apparently presents with c/o diarrhea, and nausea and vomitting.  Pt has had diarrhea "liquid stool" reminds him of C. Diff since Thursday.  REcently treated for the flu with tamiflu and had GB removed in [place]:   Principal Problem:   Diarrhea Active Problems:   Diabetes with neuropathy   Nausea & vomiting   Diarrhea (C. Diff negative/GI pathogen panel negative) -d/c METFORMIN -advance diet -on flagyl (not sure needs this) -recently on tamiflu and had GB removed a few months ago -add cholestymine Start Florastor  Hypokalemia -replete -check Mg  Nausea and vomitting ? Diabetic gastroparesis - no further  Dm2 w neuropathy Cont Gabapentin -SSI  Hypertension Cont Carvedilol  Hyperlipidemia Cont fenofibrate Cont Lipitor 23m po qhs  Anxiety Cont Cymbalta Cont Clonazepam  Psoriasis HOLD off on Humira      DVT prophylaxis:  Lovenox   Code Status: Full Code   Family Communication:   Disposition Plan:  Once diarrhea lessened   Subjective: Still with multiple stools-- red tinged now per patient  Objective: Vitals:   09/26/17 0500 09/26/17 1300 09/26/17 2141 09/27/17 0500  BP:  131/86 138/83 127/78  Pulse:  65 64 63  Resp:   17 18  Temp:  98.4 F (36.9 C) 97.7 F (36.5 C) 97.7 F (36.5 C)  TempSrc:  Oral Oral Oral  SpO2:  96% 97% 96%  Weight: 112.4 kg (247 lb 12.8 oz)   113.3 kg (249 lb 12.5 oz)  Height:        Intake/Output Summary (Last 24 hours) at 09/27/2017 1145 Last data filed at 09/27/2017 1038 Gross per 24 hour  Intake 1980 ml  Output -  Net 1980 ml   Filed Weights   09/25/17 1231 09/26/17 0500 09/27/17 0500    Weight: 115.7 kg (255 lb) 112.4 kg (247 lb 12.8 oz) 113.3 kg (249 lb 12.5 oz)    Examination:  General exam: Appears calm and comfortable  Respiratory system: Clear to auscultation. Respiratory effort normal. Cardiovascular system: S1 & S2 heard, RRR. No JVD, murmurs, rubs, gallops or clicks. No pedal edema. Gastrointestinal system: Abdomen is nondistended, soft and nontender. No organomegaly or masses felt. Normal bowel sounds heard. Central nervous system: Alert and oriented. No focal neurological deficits. Extremities: Symmetric 5 x 5 power. Skin: No rashes, lesions or ulcers Psychiatry: Judgement and insight appear normal. Mood & affect appropriate.     Data Reviewed: I have personally reviewed following labs and imaging studies  CBC: Recent Labs  Lab 09/25/17 1251 09/26/17 0351 09/27/17 0411  WBC 7.1 6.1 3.8*  HGB 14.4 12.8* 12.4*  HCT 41.5 37.6* 36.1*  MCV 88.1 87.6 88.3  PLT 205 162 1884   Basic Metabolic Panel: Recent Labs  Lab 09/25/17 1251 09/26/17 0351 09/27/17 0411  NA 137 139 140  K 3.9 3.4* 3.8  CL 106 107 110  CO2 21* 22 23  GLUCOSE 124* 65 97  BUN 11 11 8   CREATININE 1.27* 1.20 1.23  CALCIUM 8.9 8.3* 8.4*   GFR: Estimated Creatinine Clearance: 82.8 mL/min (by C-G formula based on SCr of 1.23 mg/dL). Liver Function Tests: Recent Labs  Lab 09/25/17 1251  09/26/17 0351 09/27/17 0411  AST 34 26 22  ALT 38 30 26  ALKPHOS 76 62 59  BILITOT 1.9* 1.7* 1.3*  PROT 7.7 6.7 6.4*  ALBUMIN 3.9 3.3* 3.3*   Recent Labs  Lab 09/25/17 1251  LIPASE 37   No results for input(s): AMMONIA in the last 168 hours. Coagulation Profile: No results for input(s): INR, PROTIME in the last 168 hours. Cardiac Enzymes: No results for input(s): CKTOTAL, CKMB, CKMBINDEX, TROPONINI in the last 168 hours. BNP (last 3 results) No results for input(s): PROBNP in the last 8760 hours. HbA1C: No results for input(s): HGBA1C in the last 72 hours. CBG: Recent Labs   Lab 09/25/17 1752 09/26/17 0540  GLUCAP 70 76   Lipid Profile: No results for input(s): CHOL, HDL, LDLCALC, TRIG, CHOLHDL, LDLDIRECT in the last 72 hours. Thyroid Function Tests: Recent Labs    09/26/17 0351  TSH 1.274   Anemia Panel: No results for input(s): VITAMINB12, FOLATE, FERRITIN, TIBC, IRON, RETICCTPCT in the last 72 hours. Urine analysis:    Component Value Date/Time   COLORURINE AMBER (A) 09/25/2017 1304   APPEARANCEUR HAZY (A) 09/25/2017 1304   LABSPEC 1.026 09/25/2017 1304   PHURINE 5.0 09/25/2017 1304   GLUCOSEU NEGATIVE 09/25/2017 1304   HGBUR NEGATIVE 09/25/2017 1304   BILIRUBINUR NEGATIVE 09/25/2017 1304   KETONESUR NEGATIVE 09/25/2017 1304   PROTEINUR 30 (A) 09/25/2017 1304   UROBILINOGEN 1.0 04/25/2013 1843   NITRITE NEGATIVE 09/25/2017 1304   LEUKOCYTESUR NEGATIVE 09/25/2017 1304     ) Recent Results (from the past 240 hour(s))  C difficile quick scan w PCR reflex     Status: None   Collection Time: 09/25/17  9:23 PM  Result Value Ref Range Status   C Diff antigen NEGATIVE NEGATIVE Final   C Diff toxin NEGATIVE NEGATIVE Final   C Diff interpretation No C. difficile detected.  Final  Gastrointestinal Panel by PCR , Stool     Status: None   Collection Time: 09/25/17 11:47 PM  Result Value Ref Range Status   Campylobacter species NOT DETECTED NOT DETECTED Final   Plesimonas shigelloides NOT DETECTED NOT DETECTED Final   Salmonella species NOT DETECTED NOT DETECTED Final   Yersinia enterocolitica NOT DETECTED NOT DETECTED Final   Vibrio species NOT DETECTED NOT DETECTED Final   Vibrio cholerae NOT DETECTED NOT DETECTED Final   Enteroaggregative E coli (EAEC) NOT DETECTED NOT DETECTED Final   Enteropathogenic E coli (EPEC) NOT DETECTED NOT DETECTED Final   Enterotoxigenic E coli (ETEC) NOT DETECTED NOT DETECTED Final   Shiga like toxin producing E coli (STEC) NOT DETECTED NOT DETECTED Final   Shigella/Enteroinvasive E coli (EIEC) NOT DETECTED NOT  DETECTED Final   Cryptosporidium NOT DETECTED NOT DETECTED Final   Cyclospora cayetanensis NOT DETECTED NOT DETECTED Final   Entamoeba histolytica NOT DETECTED NOT DETECTED Final   Giardia lamblia NOT DETECTED NOT DETECTED Final   Adenovirus F40/41 NOT DETECTED NOT DETECTED Final   Astrovirus NOT DETECTED NOT DETECTED Final   Norovirus GI/GII NOT DETECTED NOT DETECTED Final   Rotavirus A NOT DETECTED NOT DETECTED Final   Sapovirus (I, II, IV, and V) NOT DETECTED NOT DETECTED Final    Comment: Performed at [facility], [address] [place], [place] [address]      Anti-infectives (From admission, onward)   Start     Dose/Rate Route Frequency Ordered Stop   09/25/17 2330  metroNIDAZOLE (FLAGYL) IVPB 500 mg     500 mg  100 mL/hr over 60 Minutes Intravenous Every 8 hours 09/25/17 2251         Radiology Studies: Dg Chest Port 1 View  Result Date: 09/25/2017 CLINICAL DATA:  Cough. EXAM: PORTABLE CHEST 1 VIEW COMPARISON:  Radiographs of September 18, 2017. FINDINGS: Stable cardiomegaly. No pneumothorax or pleural effusion is noted. Left-sided ventriculoperitoneal shunt is unchanged. No acute pulmonary disease is noted. Harrington rods are again noted in the upper thoracic spine. IMPRESSION: No acute cardiopulmonary abnormality seen. Electronically Signed   By: Marijo Conception, M.D.   On: 09/25/2017 18:38   Dg Abd 2 Views  Result Date: 09/25/2017 CLINICAL DATA:  Diarrhea, nausea and vomiting EXAM: ABDOMEN - 2 VIEW COMPARISON:  None. FINDINGS: The bowel gas pattern is normal. There is no evidence of free air. No radio-opaque calculi or other significant radiographic abnormality is seen. IMPRESSION: Negative. Electronically Signed   By: Ulyses Jarred M.D.   On: 09/25/2017 19:06        Scheduled Meds: . atorvastatin  10 mg Oral Daily  . carvedilol  12.5 mg Oral BID WC  . cholestyramine  4 g Oral Q12H  . clonazePAM  0.5 mg Oral QHS  . DULoxetine  60 mg Oral BID  . enoxaparin  (LOVENOX) injection  40 mg Subcutaneous Q24H  . fenofibrate  160 mg Oral Daily  . gabapentin  600 mg Oral TID  . levETIRAcetam  750 mg Oral BID  . pantoprazole  40 mg Oral Daily   Continuous Infusions: . 0.9 % NaCl with KCl 20 mEq / L 75 mL/hr at 09/27/17 0146  . metronidazole Stopped (09/27/17 0122)     LOS: 0 days    Time spent: 35 min    Geradine Girt, DO [facility] Pager [phone]  If 7PM-7AM, please contact night-coverage [email or web address] Password [email or web address] 09/27/2017, 11:45 AM

## 2017-09-27 NOTE — Telephone Encounter (Signed)
Patient currently hospitalized. Will contact once discharged to follow up with [facility].

## 2017-09-28 ENCOUNTER — Other Ambulatory Visit: Payer: Self-pay

## 2017-09-28 ENCOUNTER — Encounter (HOSPITAL_COMMUNITY): Payer: Self-pay | Admitting: General Practice

## 2017-09-28 DIAGNOSIS — N182 Chronic kidney disease, stage 2 (mild): Secondary | ICD-10-CM

## 2017-09-28 DIAGNOSIS — E1122 Type 2 diabetes mellitus with diabetic chronic kidney disease: Secondary | ICD-10-CM

## 2017-09-28 DIAGNOSIS — K591 Functional diarrhea: Secondary | ICD-10-CM

## 2017-09-28 LAB — GLUCOSE, CAPILLARY: Glucose-Capillary: 124 mg/dL — ABNORMAL HIGH (ref 65–99)

## 2017-09-28 MED ORDER — DULOXETINE HCL 60 MG PO CPEP: 60.0000 mg | ORAL_CAPSULE | Freq: Two times a day (BID) | ORAL | Status: DC

## 2017-09-28 MED ORDER — CHOLESTYRAMINE 4 G PO PACK: 4.0000 g | PACK | Freq: Two times a day (BID) | ORAL | 0 refills | Status: DC

## 2017-09-28 MED ORDER — METRONIDAZOLE 500 MG PO TABS: 500.0000 mg | ORAL_TABLET | Freq: Three times a day (TID) | ORAL | Status: DC

## 2017-09-28 MED FILL — CHOLESTYRAMINE PACKET: 4 | 30 days supply | Qty: 60 | Fill #0

## 2017-09-28 NOTE — Progress Notes (Signed)
Pt for discharge going home, discontinued peripheral IV line, no bowel movement at this time, given health instructions, schedule meds and next appointment, given all his personal belongings and tylenol given, no s/s of distress at this time.

## 2017-09-28 NOTE — Discharge Summary (Signed)
Physician Discharge Summary  James [name] YYT:[id number] DOB: 1955/10/12 DOA: 09/25/2017  PCP: Colon Branch, MD  Admit date: 09/25/2017 Discharge date: 09/28/2017   Recommendations for Outpatient Follow-Up:   1. Close outpatient follow up 2. Holding diabetic medications (could worsen diarrhea-- have been on hold in hospital with good blood sugars)   Discharge Diagnosis:   Principal Problem:   Diarrhea Active Problems:   Diabetes with neuropathy   Nausea & vomiting   Discharge disposition:  Home  Discharge Condition: Improved.  Diet recommendation: carb mod diet  Wound care: None.   History of Present Illness:   James [name]  is a 62 y.o. male, w hypertension, dm2, diabetic neuropathy, cirrhosis (NASH), OSA, (not on cpap), VP shunt,  Psoriasis, apparently presents with c/o diarrhea, and nausea and vomitting.  Pt has had diarrhea "liquid stool" reminds him of C. Diff since Thursday.  Pt denies fever, chills, abd pain, constipation, brbpr, black stool.      Hospital Course by Problem:    Diarrhea(C. Diff negative/GI pathogen panel negative) -d/c METFORMIN/trulicity -advance diet -d/c flagyl -recently on tamiflu and had GB removed a few months ago -added cholestymine with good results   Hypokalemia -repleted -replace Mg  Nausea and vomitting -resolved  Dm2 w neuropathy Cont Gabapentin - blood sugars (76-124)  Hypertension Cont Carvedilol  Hyperlipidemia Cont fenofibrate Cont Lipitor 57m po qhs  Anxiety Cont Cymbalta Cont Clonazepam  Psoriasis -resume home meds     Medical Consultants:    None.   Discharge Exam:   Vitals:   09/27/17 2145 09/28/17 0428  BP: (!) 145/98 133/73  Pulse: (!) 59 61  Resp: 17 18  Temp: 98.7 F (37.1 C) 98 F (36.7 C)  SpO2: 98% 98%   Vitals:   09/27/17 1801 09/27/17 2145 09/28/17 0428 09/28/17 0500  BP: 133/75 (!) 145/98 133/73   Pulse: 63 (!) 59 61   Resp:  17 18   Temp:  98.7 F  (37.1 C) 98 F (36.7 C)   TempSrc:  Oral Oral   SpO2: 96% 98% 98%   Weight:    113.3 kg (249 lb 12.5 oz)  Height:        Gen:  NAD    The results of significant diagnostics from this hospitalization (including imaging, microbiology, ancillary and laboratory) are listed below for reference.     Procedures and Diagnostic Studies:   Dg Chest Port 1 View  Result Date: 09/25/2017 CLINICAL DATA:  Cough. EXAM: PORTABLE CHEST 1 VIEW COMPARISON:  Radiographs of September 18, 2017. FINDINGS: Stable cardiomegaly. No pneumothorax or pleural effusion is noted. Left-sided ventriculoperitoneal shunt is unchanged. No acute pulmonary disease is noted. Harrington rods are again noted in the upper thoracic spine. IMPRESSION: No acute cardiopulmonary abnormality seen. Electronically Signed   By: JMarijo Conception M.D.   On: 09/25/2017 18:38   Dg Abd 2 Views  Result Date: 09/25/2017 CLINICAL DATA:  Diarrhea, nausea and vomiting EXAM: ABDOMEN - 2 VIEW COMPARISON:  None. FINDINGS: The bowel gas pattern is normal. There is no evidence of free air. No radio-opaque calculi or other significant radiographic abnormality is seen. IMPRESSION: Negative. Electronically Signed   By: KUlyses JarredM.D.   On: 09/25/2017 19:06     Labs:   Basic Metabolic Panel: Recent Labs  Lab 09/25/17 1251 09/26/17 0351 09/27/17 0411  NA 137 139 140  K 3.9 3.4* 3.8  CL 106 107 110  CO2 21* 22 23  GLUCOSE 124* 65 97  BUN _0 CREATININE 1.27* 1.20 1.23  CALCIUM 8.9 8.3* 8.4*  MG  --   --  1.6*   GFR Estimated Creatinine Clearance: 82.8 mL/min (by C-G formula based on SCr of 1.23 mg/dL). Liver Function Tests: Recent Labs  Lab 09/25/17 1251 09/26/17 0351 09/27/17 0411  AST 34 26 22  ALT 38 30 26  ALKPHOS 76 62 59  BILITOT 1.9* 1.7* 1.3*  PROT 7.7 6.7 6.4*  ALBUMIN 3.9 3.3* 3.3*   Recent Labs  Lab 09/25/17 1251  LIPASE 37   No results for input(s): AMMONIA in the last 168 hours. Coagulation profile No  results for input(s): INR, PROTIME in the last 168 hours.  CBC: Recent Labs  Lab 09/25/17 1251 09/26/17 0351 09/27/17 0411  WBC 7.1 6.1 3.8*  HGB 14.4 12.8* 12.4*  HCT 41.5 37.6* 36.1*  MCV 88.1 87.6 88.3  PLT 205 162 145*   Cardiac Enzymes: No results for input(s): CKTOTAL, CKMB, CKMBINDEX, TROPONINI in the last 168 hours. BNP: Invalid input(s): POCBNP CBG: Recent Labs  Lab 09/26/17 0540 09/27/17 1219 09/27/17 1620 09/27/17 2142 09/28/17 0754  GLUCAP 76 101* 182* 99 124*   D-Dimer No results for input(s): DDIMER in the last 72 hours. Hgb A1c No results for input(s): HGBA1C in the last 72 hours. Lipid Profile No results for input(s): CHOL, HDL, LDLCALC, TRIG, CHOLHDL, LDLDIRECT in the last 72 hours. Thyroid function studies Recent Labs    09/26/17 0351  TSH 1.274   Anemia work up No results for input(s): VITAMINB12, FOLATE, FERRITIN, TIBC, IRON, RETICCTPCT in the last 72 hours. Microbiology Recent Results (from the past 240 hour(s))  C difficile quick scan w PCR reflex     Status: None   Collection Time: 09/25/17  9:23 PM  Result Value Ref Range Status   C Diff antigen NEGATIVE NEGATIVE Final   C Diff toxin NEGATIVE NEGATIVE Final   C Diff interpretation No C. difficile detected.  Final  Gastrointestinal Panel by PCR , Stool     Status: None   Collection Time: 09/25/17 11:47 PM  Result Value Ref Range Status   Campylobacter species NOT DETECTED NOT DETECTED Final   Plesimonas shigelloides NOT DETECTED NOT DETECTED Final   Salmonella species NOT DETECTED NOT DETECTED Final   Yersinia enterocolitica NOT DETECTED NOT DETECTED Final   Vibrio species NOT DETECTED NOT DETECTED Final   Vibrio cholerae NOT DETECTED NOT DETECTED Final   Enteroaggregative E coli (EAEC) NOT DETECTED NOT DETECTED Final   Enteropathogenic E coli (EPEC) NOT DETECTED NOT DETECTED Final   Enterotoxigenic E coli (ETEC) NOT DETECTED NOT DETECTED Final   Shiga like toxin producing E coli  (STEC) NOT DETECTED NOT DETECTED Final   Shigella/Enteroinvasive E coli (EIEC) NOT DETECTED NOT DETECTED Final   Cryptosporidium NOT DETECTED NOT DETECTED Final   Cyclospora cayetanensis NOT DETECTED NOT DETECTED Final   Entamoeba histolytica NOT DETECTED NOT DETECTED Final   Giardia lamblia NOT DETECTED NOT DETECTED Final   Adenovirus F40/41 NOT DETECTED NOT DETECTED Final   Astrovirus NOT DETECTED NOT DETECTED Final   Norovirus GI/GII NOT DETECTED NOT DETECTED Final   Rotavirus A NOT DETECTED NOT DETECTED Final   Sapovirus (I, II, IV, and V) NOT DETECTED NOT DETECTED Final    Comment: Performed at [facility], [address] [place], [place] [address]     Discharge Instructions:   Discharge Instructions    Diet Carb Modified   Complete by:  As directed  Discharge instructions   Complete by:  As directed    Both metformin and trulicity can cause diarrhea/abd pain-- have held until you can follow up with your PCP to discuss -see attached about diet  -close follow up with your PCP and surgeon   Increase activity slowly   Complete by:  As directed      Allergies as of 09/28/2017      Reactions   Bee Venom Swelling   Hydrocodone-homatropine Other (See Comments)   Depressed feeling   Morphine And Related Other (See Comments)   Hallucinations, back in the 80s. States has taken vicodin before w/o problems    Sulfa Drugs Cross Reactors Rash      Medication List    STOP taking these medications   azithromycin 250 MG tablet Commonly known as:  ZITHROMAX   benzonatate 100 MG capsule Commonly known as:  TESSALON   metFORMIN 1000 MG tablet Commonly known as:  GLUCOPHAGE   oseltamivir 75 MG capsule Commonly known as:  TAMIFLU   TRULICITY 1.5 QQ/2.2LN Sopn Generic drug:  Dulaglutide     TAKE these medications   acetaminophen 500 MG tablet Commonly known as:  TYLENOL Take 1,000 mg by mouth every 6 (six) hours as needed for mild pain or headache.     Adalimumab 40 MG/0.8ML Pnkt Commonly known as:  HUMIRA PEN Inject 0.8 mLs into the skin every 14 (fourteen) days.   atorvastatin 10 MG tablet Commonly known as:  LIPITOR TAKE 1 TABLET (10 MG TOTAL) BY MOUTH DAILY.   carvedilol 12.5 MG tablet Commonly known as:  COREG Take 1 tablet (12.5 mg total) by mouth 2 (two) times daily with a meal.   cholestyramine 4 g packet Commonly known as:  QUESTRAN Take 1 packet (4 g total) by mouth every 12 (twelve) hours.   clonazePAM 0.5 MG tablet Commonly known as:  KLONOPIN Take 1 tablet (0.5 mg total) by mouth at bedtime.   DULoxetine 60 MG capsule Commonly known as:  CYMBALTA Take 1 capsule (60 mg total) by mouth 2 (two) times daily.   fenofibrate micronized 134 MG capsule Commonly known as:  LOFIBRA TAKE 1 CAPSULE (134 MG TOTAL) BY MOUTH DAILY BEFORE BREAKFAST.   FREESTYLE FREEDOM LITE w/Device Kit Use to check blood sugars 3 times daily. Dx code: E11.9   freestyle lancets Use to check blood sugar 3 times daily. Dx code E11.9   gabapentin 600 MG tablet Commonly known as:  NEURONTIN Take 1 tablet (600 mg total) by mouth 3 (three) times daily.   glimepiride 1 MG tablet Commonly known as:  AMARYL TAKE 1 TABLET (1 MG TOTAL) BY MOUTH DAILY BEFORE SUPPER.   glucose blood test strip Commonly known as:  ACCU-CHEK GUIDE Use to test blood sugar three times daily   levETIRAcetam 750 MG tablet Commonly known as:  KEPPRA Take 1 tablet (750 mg total) by mouth 2 (two) times daily.   omeprazole 40 MG capsule Commonly known as:  PRILOSEC Take 1 capsule (40 mg total) by mouth 2 (two) times daily before a meal.      Follow-up Information    Colon Branch, MD Follow up in 1 week(s).   Specialty:  Internal Medicine Contact information: [address] [place] [address] [phone]            Time coordinating discharge: 35 min  Signed:  Geradine Girt   [facility] 09/28/2017, 9:38  AM

## 2017-09-29 ENCOUNTER — Telehealth: Payer: Self-pay | Admitting: Internal Medicine

## 2017-09-29 NOTE — Telephone Encounter (Signed)
Needs TCM visit

## 2017-09-29 NOTE — Telephone Encounter (Signed)
No answer

## 2017-09-30 DIAGNOSIS — H04123 Dry eye syndrome of bilateral lacrimal glands: Secondary | ICD-10-CM | POA: Diagnosis not present

## 2017-09-30 DIAGNOSIS — E119 Type 2 diabetes mellitus without complications: Secondary | ICD-10-CM | POA: Diagnosis not present

## 2017-09-30 LAB — HM DIABETES EYE EXAM

## 2017-09-30 NOTE — Telephone Encounter (Signed)
No answer

## 2017-10-01 ENCOUNTER — Ambulatory Visit (INDEPENDENT_AMBULATORY_CARE_PROVIDER_SITE_OTHER): Payer: 59 | Admitting: Internal Medicine

## 2017-10-01 ENCOUNTER — Encounter: Payer: Self-pay | Admitting: Internal Medicine

## 2017-10-01 ENCOUNTER — Other Ambulatory Visit: Payer: Self-pay | Admitting: *Deleted

## 2017-10-01 VITALS — BP 126/68 | HR 61 | Temp 97.6°F | Resp 14 | Ht 69.0 in | Wt 252.4 lb

## 2017-10-01 DIAGNOSIS — E1122 Type 2 diabetes mellitus with diabetic chronic kidney disease: Secondary | ICD-10-CM

## 2017-10-01 DIAGNOSIS — R197 Diarrhea, unspecified: Secondary | ICD-10-CM

## 2017-10-01 DIAGNOSIS — N182 Chronic kidney disease, stage 2 (mild): Secondary | ICD-10-CM

## 2017-10-01 DIAGNOSIS — L409 Psoriasis, unspecified: Secondary | ICD-10-CM | POA: Diagnosis not present

## 2017-10-01 LAB — CBC WITH DIFFERENTIAL/PLATELET
BASOS PCT: 0.6 % (ref 0.0–3.0)
Basophils Absolute: 0 10*3/uL (ref 0.0–0.1)
Eosinophils Absolute: 0.1 10*3/uL (ref 0.0–0.7)
Eosinophils Relative: 2.2 % (ref 0.0–5.0)
HCT: 41.6 % (ref 39.0–52.0)
HEMOGLOBIN: 14.1 g/dL (ref 13.0–17.0)
Lymphocytes Relative: 28.1 % (ref 12.0–46.0)
Lymphs Abs: 1.6 10*3/uL (ref 0.7–4.0)
MCHC: 33.8 g/dL (ref 30.0–36.0)
MCV: 91.1 fl (ref 78.0–100.0)
MONO ABS: 0.3 10*3/uL (ref 0.1–1.0)
MONOS PCT: 6.1 % (ref 3.0–12.0)
Neutro Abs: 3.6 10*3/uL (ref 1.4–7.7)
Neutrophils Relative %: 63 % (ref 43.0–77.0)
Platelets: 178 10*3/uL (ref 150.0–400.0)
RBC: 4.57 Mil/uL (ref 4.22–5.81)
RDW: 15.8 % — AB (ref 11.5–15.5)
WBC: 5.7 10*3/uL (ref 4.0–10.5)

## 2017-10-01 LAB — BASIC METABOLIC PANEL
BUN: 12 mg/dL (ref 6–23)
CO2: 25 mEq/L (ref 19–32)
Calcium: 9.5 mg/dL (ref 8.4–10.5)
Chloride: 107 mEq/L (ref 96–112)
Creatinine, Ser: 1.2 mg/dL (ref 0.40–1.50)
GFR: 65.73 mL/min (ref >60.00)
GLUCOSE: 108 mg/dL — AB (ref 70–99)
POTASSIUM: 4.2 meq/L (ref 3.5–5.1)
Sodium: 140 mEq/L (ref 135–145)

## 2017-10-01 MED ORDER — CHOLESTYRAMINE 4 G PO PACK: 4.0000 g | PACK | Freq: Two times a day (BID) | ORAL | 1 refills | Status: DC

## 2017-10-01 NOTE — Patient Outreach (Addendum)
[name]) Care Management  10/01/2017  James [name] 12-18-1955 [id number]   Post hospital transition of care call completed. See transition of care template for details. James [name] was in [facility] from 3/30-4/2 for non- intractable vomiting , nausea and diarrhea. James [name] and his wife state they believe the symptoms were a reaction to Tamiflu  after he tested positive for the flu on 3/21. C. difficile stool  test done 3/30 was negative. He states he was also in [facility] from 3/23-3/26 for generalized malaise, nausea, diarrhea and poor oral intake. James [name], his wife also informed this RNCM during the call that James [name] was diagnosed with severe non alcoholic liver cirrhosis when his gallbladder was removed on 08/25/17. James [name] says they told James [name] the chronic right sided abdominal pain he has experienced since his gallbladder was removed may be related to the cirrhosis. James [name] says they were told the cirrhosis was likely caused the numerous drugs James [name] has taken for his psoriatic arthritis over the years.  James [name] states his blood sugars have been between 80 and 300 and that he was told to stop Metformin and Trulicity upon discharge from the hospital but Dr [name] told him today it was [facility] to restart them when James [name] saw him in follow up.  He remains on Glimepiride and he was cautioned to watch for hypoglycemia. Dr. [name] continued the Questran twice daily to treat the diarrhea. Reviewed dates and times of provider follow up appointments with [facility] as follows: 5/3 10:00 Dr Henrene Pastor -gastroenterology, 5/6 lab appointment, 5/9 10:00 am Dr [name]- endocrinologist, 5/21 10:40 am- Dr [name] (primary care provider). No immediate care management needs identified so James [name] will resume chronic disease self management assistance with [facility].    Barrington Ellison RN,CCM,CDE [facility] Management Coordinator Office Phone [phone] Office Fax [phone]  Barrington Ellison RN,CCM,CDE [facility] Management Coordinator Office Phone [phone] Office Fax [phone]

## 2017-10-01 NOTE — Patient Instructions (Addendum)
GO TO THE LAB : Get the blood work     GO TO THE FRONT DESK Schedule your next appointment for a physical exam in 3 or 4 months from today  Go back on metformin and Trulicity, watch for low sugar symptoms. If diarrhea come back please let me know  Talk with your surgeon about the right-sided abdominal pain, if symptoms severe go to the ER.

## 2017-10-01 NOTE — Progress Notes (Signed)
Subjective:    Patient ID: James [name], male    DOB: 1956/05/29, 62 y.o.   MRN: [id number]  DOS:  10/01/2017 Type of visit - description : TCM 7 Interval history: Admitted to the hospital and discharge 09-28-17. He presented with diarrhea, nausea, vomiting. Was provided supportive care, C Diff and other pathogens in the stools were negative. Eventually improved and was discharged home.  Also, had a cholecystectomy and since then chronic pain at the right abdomen, status post ER  eval, CT negative except for recent surgical changes.   Review of Systems Since he left the hospital is feeling better. No fever chills Appetite is improving No vomiting, diarrhea.  Bowel movements are daily and solid. Continue with episodic right-sided abdominal pain. Not taking metformin or Trulicity (per hospital team recommendations)  Past Medical History:  Diagnosis Date  . Acid reflux   . Arthritis   . Cholelithiasis   . Chronic headaches    on cymbalta  . Cirrhosis ([place])   . Colon polyps   . Complication of anesthesia    problems waking up from anesthesia  . Depression    on cymbalta  . Diabetes mellitus with neuropathy ([place])    borderline per primary MD  . Diverticulitis 03/2013  . Eczema   . Elevated LFTs   . Fatty liver   . History of kidney stones   . Hypertension   . Insomnia 04/26/2013  . Kidney stone   . Morbid obesity ([place])   . Neuromuscular disorder (H[facility])    neuropathy  . Neuropathy   . Post-traumatic hydrocephalus    s/p shunts x 2 (first got infected )  . Psoriasis    sees Dr [name]  . Psoriatic arthritis ([place])   . 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 Sleep apnea    no CPAP     . Stomach ulcer   . Testosterone deficiency 04/28/2011    Past Surgical History:  Procedure Laterality Date  . BACK SURGERY  1980  . CHOLECYSTECTOMY  08/25/2017   laproscopic   . CHOLECYSTECTOMY N/A 08/25/2017   Procedure: LAPAROSCOPIC  CHOLECYSTECTOMY WITH INTRAOPERATIVE CHOLANGIOGRAM;  Surgeon: [name], MD;  Location: [facility];  Service: General;  Laterality: N/A;  . SHOULDER SURGERY Left 2010  . TOE SURGERY Left 2018  . TOTAL HIP ARTHROPLASTY Left 2011  . VENTRICULOPERITONEAL SHUNT  2007   x2    Social History   Socioeconomic History  . Marital status: Married    Spouse name: [name]  . Number of children: 2  . Years of education: Not on file  . Highest education level: Not on file  Occupational History  . Occupation: disable   Social Needs  . Financial resource strain: Not on file  . Food insecurity:    Worry: Not on file    Inability: Not on file  . Transportation needs:    Medical: Not on file    Non-medical: Not on file  Tobacco Use  . Smoking status: Never Smoker  . Smokeless tobacco: Never Used  Substance and Sexual Activity  . Alcohol use: No  . Drug use: No  . Sexual activity: Yes    Partners: Female  Lifestyle  . Physical activity:    Days per week: Not on file    Minutes per session: Not on file  . Stress: Not on file  Relationships  . Social connections:    Talks on phone: Not on file  Gets together: Not on file    Attends religious service: Not on file    Active member of club or organization: Not on file    Attends meetings of clubs or organizations: Not on file    Relationship status: Not on file  . Intimate partner violence:    Fear of current or ex partner: Not on file    Emotionally abused: Not on file    Physically abused: Not on file    Forced sexual activity: Not on file  Other Topics Concern  . 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      Allergies as of 10/01/2017      Reactions   Bee Venom Swelling   Hydrocodone-homatropine Other (See Comments)   Depressed feeling   Morphine And Related Other  (See Comments)   Hallucinations, back in the 80s. States has taken vicodin before w/o problems    Sulfa Drugs Cross Reactors Rash      Medication List        Accurate as of 10/01/17 11:59 PM. Always use your most recent med list.          acetaminophen 500 MG tablet Commonly known as:  TYLENOL Take 1,000 mg by mouth every 6 (six) hours as needed for mild pain or headache.   Adalimumab 40 MG/0.8ML Pnkt Commonly known as:  HUMIRA PEN Inject 0.8 mLs into the skin every 14 (fourteen) days.   atorvastatin 10 MG tablet Commonly known as:  LIPITOR TAKE 1 TABLET (10 MG TOTAL) BY MOUTH DAILY.   carvedilol 12.5 MG tablet Commonly known as:  COREG Take 1 tablet (12.5 mg total) by mouth 2 (two) times daily with a meal.   cholestyramine 4 g packet Commonly known as:  QUESTRAN Take 1 packet (4 g total) by mouth every 12 (twelve) hours.   clonazePAM 0.5 MG tablet Commonly known as:  KLONOPIN Take 1 tablet (0.5 mg total) by mouth at bedtime.   DULoxetine 60 MG capsule Commonly known as:  CYMBALTA Take 1 capsule (60 mg total) by mouth 2 (two) times daily.   fenofibrate micronized 134 MG capsule Commonly known as:  LOFIBRA TAKE 1 CAPSULE (134 MG TOTAL) BY MOUTH DAILY BEFORE BREAKFAST.   FREESTYLE FREEDOM LITE w/Device Kit Use to check blood sugars 3 times daily. Dx code: E11.9   freestyle lancets Use to check blood sugar 3 times daily. Dx code E11.9   gabapentin 600 MG tablet Commonly known as:  NEURONTIN Take 1 tablet (600 mg total) by mouth 3 (three) times daily.   glimepiride 1 MG tablet Commonly known as:  AMARYL TAKE 1 TABLET (1 MG TOTAL) BY MOUTH DAILY BEFORE SUPPER.   glucose blood test strip Commonly known as:  ACCU-CHEK GUIDE Use to test blood sugar three times daily   levETIRAcetam 750 MG tablet Commonly known as:  KEPPRA Take 1 tablet (750 mg total) by mouth 2 (two) times daily.   metFORMIN 1000 MG tablet Commonly known as:  GLUCOPHAGE Take 1 tablet (1,000  mg total) by mouth 2 (two) times daily with a meal.   omeprazole 40 MG capsule Commonly known as:  PRILOSEC Take 1 capsule (40 mg total) by mouth 2 (two) times daily before a meal.   TRULICITY 1.5 NK/5.3ZJ Sopn Generic drug:  Dulaglutide Inject 1.5 mg into  the skin once a week.          Objective:   Physical Exam BP 126/68 (BP Location: Left Arm, Patient Position: Sitting, Cuff Size: Normal)   Pulse 61   Temp 97.6 F (36.4 C) (Oral)   Resp 14   Ht 5' 9" (1.753 m)   Wt 252 lb 6 oz (114.5 kg)   SpO2 96%   BMI 37.27 kg/m  General:   Well developed, well nourished . NAD.  HEENT:  Normocephalic . Face symmetric, atraumatic Lungs:  CTA B Normal respiratory effort, no intercostal retractions, no accessory muscle use. Heart: RRR,  no murmur.  no pretibial edema bilaterally  Abdomen:  Not distended, soft, upon palpation he is a slightly tender on the right lower abdomen without mass or rebound.  Minimal pain with deeper palpation.  Good bowel sounds. Skin: Not pale. Not jaundice Neurologic:  alert & oriented X3.  Speech normal, gait appropriate for age and unassisted Psych--  Cognition and judgment appear intact.  Cooperative with normal attention span and concentration.  Behavior appropriate. No anxious or depressed appearing.     Assessment & Plan:   Assessment  DM - Dr [name] Neuropathy (x years, rx gaba 05-2014, w/u 11-2014  RPR neg, vit D-B12-Folic Acid wnl ); Saw Dr Posey Pronto, [place] 1. The electrophysiologic findings are most consistent with a chronic, distal and symmetric sensorimotor polyneuropathy, predominantly axon loss in type, affecting the lower extremities. 2. There is no evidence of a superimposed lumbosacral radiculopathy. CRI HTN Hyperlipidemia  OSA , dx 2012, sleep study again 02-2015 Dr Dohmeier--> severe OSA, intolerant to CPAP Depression, insomnia ----on Cymbalta Chronic headaches -----on Cymbalta MSK: on disability d/t back pain-  HAs Hypogonadism  Dx 2012, normal T 11-2014 (on no RX) GI:  --GERD, diverticulitis 2014, h/o PUD --Fatty liver per [place] 05-2014 (? Of cirrhosis per CT 2014) -- NASH per GI note 08-2015  Psoriasis, psoriatic arthritis -- on HUMIRA  Posttraumatic hydrocephalus s/p 2 shunts (first got infected) H/u urolithiasis +FH CAD brother MI age 45  PLAN Diarrhea: Resolved, recheck a BMP and CBC. Was rx Lucrezia Starch for symptomatic treatment, continue for now, RF sent  DM: Due to recent nausea vomiting diarrhea metformin and Trulicity were hold.  Okay to restart. Right-sided abdominal pain after cholecystectomy: Chronic since surgery, already seen at the ER and CT was unremarkable except for surgical changes.  Recommend to discuss with surgery. Psoriasis: Also request a referral to a different dermatology practice, referred to [facility] dermatologist RTC 3-4 months, CPX

## 2017-10-01 NOTE — Progress Notes (Signed)
Pre visit review using our clinic review tool, if applicable. No additional management support is needed unless otherwise documented below in the visit note. 

## 2017-10-03 NOTE — Assessment & Plan Note (Signed)
Diarrhea: Resolved, recheck a BMP and CBC. Was rx Lucrezia Starch for symptomatic treatment, continue for now, RF sent  DM: Due to recent nausea vomiting diarrhea metformin and Trulicity were hold.  Okay to restart. Right-sided abdominal pain after cholecystectomy: Chronic since surgery, already seen at the ER and CT was unremarkable except for surgical changes.  Recommend to discuss with surgery. Psoriasis: Also request a referral to a different dermatology practice, referred to [facility] dermatologist RTC 3-4 months, CPX

## 2017-10-04 MED FILL — HUMIRA PEN 40 MG/0.8ML PNKT: 40 | 28 days supply | Qty: 2 | Fill #0

## 2017-10-05 ENCOUNTER — Encounter: Payer: Self-pay | Admitting: Internal Medicine

## 2017-10-07 ENCOUNTER — Telehealth: Payer: Self-pay | Admitting: *Deleted

## 2017-10-07 NOTE — Telephone Encounter (Signed)
Received FMLA/STD paperwork from [employer] for spouse, [name], to care for patient during recent infectious illness [Flu & Bronchitis], completed as much as possible; forwarded to provider/SLS 04/11

## 2017-10-11 NOTE — Telephone Encounter (Signed)
Forms signed and faxed to [employer] at [phone]. Forms will be kept for approximately 30 days then sent for scanning.

## 2017-10-11 NOTE — Telephone Encounter (Signed)
Received fax confirmation

## 2017-10-13 NOTE — Telephone Encounter (Signed)
Receivedpaperwork from [employer], asking for update on questions #6 & 7 with estimated parameters for these questions. Paperwork can only be filed for [1] illness; this was for viral illness [inflluenza & related symptoms] when patient was out for continuous period and needed assistance. Question #4 stated that incapacity would be 1-2 weeks from when condition commenced [09/15/17]. Questions #6 & 7 remain with No for answers. Paperwork faxed back to [facility] at [phone]/GBM 04/17

## 2017-10-22 MED FILL — CARVEDILOL 12.5 MG TABLET: 12.5 | 30 days supply | Qty: 60 | Fill #4

## 2017-10-22 MED FILL — clonazePAM 0.5 MG TABS: 0.5 | 30 days supply | Qty: 30 | Fill #1

## 2017-10-22 MED FILL — FENOFIBRATE 134 MG CAPSULE: 134 | 30 days supply | Qty: 30 | Fill #2

## 2017-10-22 MED FILL — levETIRAcetam 750 MG TABS: 750 | 30 days supply | Qty: 60 | Fill #7

## 2017-10-22 MED FILL — TRULICITY 1.5 MG/0.5 ML PEN: 1.5 | 28 days supply | Qty: 2 | Fill #1

## 2017-10-22 MED FILL — GLIMEPIRIDE 1 MG TABLET: 1 | 30 days supply | Qty: 30 | Fill #1

## 2017-10-22 MED FILL — GABAPENTIN 600 MG TABS: 600 | 30 days supply | Qty: 90 | Fill #3

## 2017-10-22 MED FILL — DULoxetine HCL 60 MG CPEP: 60 | 30 days supply | Qty: 60 | Fill #6

## 2017-10-25 NOTE — Telephone Encounter (Signed)
Copy & Paste from spouse regarding FMLA update:   10/25/17 11:10 AM  Note    Received copy of FMLA/STD paperwork from 10/11/17 that was completed for Viral Illness [Flu & Bronchitis] and subsequent Hospital admittance for these; the first time resent from [employer] as claimant [for spouse care] requested an intermittent basis time off, but it was not allowed d/t the fact that paperwork was for Acute illness. I have already addressed this issue once with return paperwork to [employer] on 10/13/17:  "Received paperwork from original FMLA, asking for update on questions #6 & 7 with estimated parameters for these questions. Paperwork can only be filed for [1] illness; this was for viral illness [inflluenza & related symptoms] when patient was out for continuous period and needed assistance. Question #4 stated that incapacity would be 1-2 weeks from when condition commenced [09/15/17]. Questions #6 & 7 remain with No for answers. Paperwork faxed back to [facility] at [phone]/NOI 04/17" LMOM with contact name and number for return call, if needed RE: this matter and to inform spouse to request New FMLA paperwork from [employer] to cover Intermittent absences for Chronic illnesses and we would complete in a timely manner/SLS 04/29

## 2017-10-27 ENCOUNTER — Telehealth: Payer: Self-pay | Admitting: Pharmacist

## 2017-10-27 DIAGNOSIS — K759 Inflammatory liver disease, unspecified: Secondary | ICD-10-CM

## 2017-10-27 HISTORY — DX: Inflammatory liver disease, unspecified: K75.9

## 2017-10-27 NOTE — Telephone Encounter (Signed)
Called patient to schedule an appointment for the [facility]. I was unable to reach the patient so I left a HIPAA-compliant message requesting that the patient return my call.

## 2017-10-29 ENCOUNTER — Encounter: Payer: Self-pay | Admitting: Internal Medicine

## 2017-10-29 ENCOUNTER — Ambulatory Visit (INDEPENDENT_AMBULATORY_CARE_PROVIDER_SITE_OTHER): Payer: 59 | Admitting: Internal Medicine

## 2017-10-29 VITALS — BP 100/62 | HR 96 | Ht 71.0 in | Wt 252.0 lb

## 2017-10-29 DIAGNOSIS — K7460 Unspecified cirrhosis of liver: Secondary | ICD-10-CM

## 2017-10-29 DIAGNOSIS — K8020 Calculus of gallbladder without cholecystitis without obstruction: Secondary | ICD-10-CM | POA: Diagnosis not present

## 2017-10-29 DIAGNOSIS — Z23 Encounter for immunization: Secondary | ICD-10-CM | POA: Diagnosis not present

## 2017-10-29 DIAGNOSIS — K219 Gastro-esophageal reflux disease without esophagitis: Secondary | ICD-10-CM

## 2017-10-29 NOTE — Progress Notes (Signed)
HISTORY OF PRESENT ILLNESS:  James [name] is a 62 y.o. male with multiple medical problems including diabetes mellitus with neuropathy, hypertension, psoriatic arthritis, morbid obesity, NASH cirrhosis, history of diverticulitis, GERD, non-adenomatous colon polyps, and symptomatic cholelithiasis status post cholecystectomy. He is accompanied today by his wife. They're chief complaint is understanding about his liver disease. The patient was seen earlier this year for symptomatic cholelithiasis for which she underwent cholecystectomy in February. He was told by the general surgeon that he had a nodular cirrhotic liver. This concerned his wife. He was first identified as having cirrhosis on incidental imaging. Formally evaluated in this office initially March 2017. His wife is present at that time. We had an extensive discussion regarding cirrhosis. We evaluated him for multiple potential causes for hepatic cirrhosis and determined that his etiology was most likely NASH. He was advised with regards to exercise and weight loss. He was vaccinated. He underwent screening upper endoscopy in May 2017. No varices. Relook in 2-3 years recommended. His abdominal pain resolved post cholecystectomy. He was seen in the emergency room on several occasions for other abdominal discomfort. 2 CT scans revealed postoperative changes and hepatic cirrhosis. No mass lesions in the liver. Patient continues to be overweight. He does not exercise area she does watch his sodium. No problems with encephalopathy. He does have VP shunt from prior trauma related hydrocephalus. No problems with significant edema. No GI bleeding. No change in mental status from baseline. Review of outside laboratories shows most recent sodium 140, creatinine 1.2, hemoglobin 14.1, platelet count 178,000, INR 1.1, and total bilirubin 1.3. At this time his GI review of systems is unremarkable. He does take omeprazole 40 mg twice daily for GERD symptom relief.  His last complete colonoscopy was performed February 2015. Non-adenomatous polyp removed. His wife inquires about the prospects of transplantation evaluation.  REVIEW OF SYSTEMS:  All non-GI ROS negative unless otherwise stated in the history of present illness except for arthritis, back pain, visual change, confusion, cough, fatigue, headaches, skin rash, sleeping problems  Past Medical History:  Diagnosis Date  . Acid reflux   . Arthritis   . Cholelithiasis   . Chronic headaches    on cymbalta  . Cirrhosis ([place])   . Colon polyps   . Complication of anesthesia    problems waking up from anesthesia  . Depression    on cymbalta  . Diabetes mellitus with neuropathy ([place])    borderline per primary MD  . Diverticulitis 03/2013  . Eczema   . Elevated LFTs   . Fatty liver   . History of kidney stones   . Hypertension   . Insomnia 04/26/2013  . Kidney stone   . Morbid obesity ([place])   . Neuromuscular disorder (HCC)    neuropathy  . Neuropathy   . Post-traumatic hydrocephalus    s/p shunts x 2 (first got infected )  . Psoriasis    sees Dr [name]  . Psoriatic arthritis ([place])   . 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 Sleep apnea    no CPAP     . Stomach ulcer   . Testosterone deficiency 04/28/2011    Past Surgical History:  Procedure Laterality Date  . BACK SURGERY  1980  . CHOLECYSTECTOMY  08/25/2017   laproscopic   . CHOLECYSTECTOMY N/A 08/25/2017   Procedure: LAPAROSCOPIC CHOLECYSTECTOMY WITH INTRAOPERATIVE CHOLANGIOGRAM;  Surgeon: [name], MD;  Location: [facility];  Service: General;  Laterality: N/A;  .  SHOULDER SURGERY Left 2010  . TOE SURGERY Left 2018  . TOTAL HIP ARTHROPLASTY Left 2011  . VENTRICULOPERITONEAL SHUNT  2007   x2    Social History SKEETER SHEARD  reports that he has never smoked. He has never used smokeless tobacco. He reports that he does not drink alcohol or use drugs.  family history includes Down  syndrome in his son; Healthy in his mother; Heart disease in his brother; Lung cancer in his father; Other in his brother.  Allergies  Allergen Reactions  . Bee Venom Swelling  . Hydrocodone-Homatropine Other (See Comments)    Depressed feeling  . Morphine And Related Other (See Comments)    Hallucinations, back in the 80s. States has taken vicodin before w/o problems   . Sulfa Drugs Cross Reactors Rash       PHYSICAL EXAMINATION: Vital signs: BP 100/62   Pulse 96   Ht 5' 11"  (1.803 m)   Wt 252 lb (114.3 kg)   BMI 35.15 kg/m   Constitutional: obese, unhealthy appearing, no acute distress Psychiatric: alert and oriented x3, cooperative. Flat affect Eyes: extraocular movements intact, anicteric, conjunctiva pink Mouth: oral pharynx moist, no lesions Neck: supple no lymphadenopathy Cardiovascular: heart regular rate and rhythm, no murmur Lungs: clear to auscultation bilaterally Abdomen: soft,obese, nontender, nondistended, no obvious ascites, no peritoneal signs, normal bowel sounds, no organomegaly Rectal:omitted Extremities: no clubbing, cyanosis, or lower extremity edema bilaterally Skin: psoriatic plaques the left lower extremity. No additional relevant lesions on visible extremities Neuro: No focal deficits. No asterixis.  ASSESSMENT:  #1. NASH cirrhosis. Current MELD score 10 (6% chance of mortality at 3 months). Compensated #2. Status post cholecystectomy for symptomatic cholelithiasis #3. Morbid obesity #4. Multiple medical problems #5. Negative screening upper endoscopy 2 years ago #6. Status post vaccination series #7.Screening colonoscopy up-to-date #8. Multiple medical problems #9. Suboptimal medical insight #10. GERD  PLAN:  #1. Exhaustively and comprehensibly reviewed cirrhosis in general. We discussed complications of impaired hepatic synthetic function. We discussed complications of portal hypertension. We discussed the risk of hepatocellular carcinoma.  We discussed MELD score and is implication. We discussed the role of hepatic transplantation, exclusion factors, and optimal timing issues. #2. Exercise and weight loss #3. Low sodium diet #4. Repeat screening EGD in the next year #5. Screening colonoscopy 2025 #6. Routine office follow-up in 6 months. Ultrasound at that time. #7. Ongoing general medical care with other medical providers.  45 minutes was spent face-to-face with the patient. Greater than 50% a time use for counseling regarding his hepatic cirrhosis.

## 2017-10-29 NOTE — Patient Instructions (Signed)
Please continue a low sodium diet, weight loss, and exercise.   Please follow up in 6 months

## 2017-11-01 ENCOUNTER — Other Ambulatory Visit (INDEPENDENT_AMBULATORY_CARE_PROVIDER_SITE_OTHER): Payer: 59

## 2017-11-01 ENCOUNTER — Ambulatory Visit (INDEPENDENT_AMBULATORY_CARE_PROVIDER_SITE_OTHER): Payer: 59 | Admitting: Pharmacist

## 2017-11-01 DIAGNOSIS — E1165 Type 2 diabetes mellitus with hyperglycemia: Secondary | ICD-10-CM

## 2017-11-01 DIAGNOSIS — Z79899 Other long term (current) drug therapy: Secondary | ICD-10-CM

## 2017-11-01 LAB — COMPREHENSIVE METABOLIC PANEL
ALBUMIN: 3.9 g/dL (ref 3.5–5.2)
ALK PHOS: 68 U/L (ref 39–117)
ALT: 35 U/L (ref 0–53)
AST: 37 U/L (ref 0–37)
BILIRUBIN TOTAL: 0.6 mg/dL (ref 0.2–1.2)
BUN: 20 mg/dL (ref 6–23)
CALCIUM: 9.3 mg/dL (ref 8.4–10.5)
CO2: 22 mEq/L (ref 19–32)
Chloride: 107 mEq/L (ref 96–112)
Creatinine, Ser: 1.25 mg/dL (ref 0.40–1.50)
GFR: 62.69 mL/min (ref >60.00)
Glucose, Bld: 156 mg/dL — ABNORMAL HIGH (ref 70–99)
Potassium: 4.6 mEq/L (ref 3.5–5.1)
Sodium: 139 mEq/L (ref 135–145)
TOTAL PROTEIN: 7.4 g/dL (ref 6.0–8.3)

## 2017-11-01 LAB — LDL CHOLESTEROL, DIRECT: LDL DIRECT: 83 mg/dL

## 2017-11-01 LAB — LIPID PANEL
CHOLESTEROL: 152 mg/dL (ref 0–200)
HDL: 36.7 mg/dL — AB (ref >39.00)
NONHDL: 115.4
TRIGLYCERIDES: 275 mg/dL — AB (ref 0.0–149.0)
Total CHOL/HDL Ratio: 4
VLDL: 55 mg/dL — ABNORMAL HIGH (ref 0.0–40.0)

## 2017-11-01 LAB — HEMOGLOBIN A1C: HEMOGLOBIN A1C: 6 % (ref 4.6–6.5)

## 2017-11-01 NOTE — Telephone Encounter (Signed)
Called patient to schedule an appointment for the [facility]. I was unable to reach the patient so I left a HIPAA-compliant message requesting that the patient return my call.

## 2017-11-01 NOTE — Progress Notes (Signed)
S: Patient has a follow up today for his specialty medication, Humira.   Patient is currently taking Humira for psoriatic arthritis. Patient is managed by James [name] for this.   Adherence: denies any missed doses except for when he has had to hold it for infections (admitted to hospital earlier this year for influenza).  Efficacy: feels like it is working well for the psoriasis and arthritis.  Dosing: SubQ: 40 mg every other week  Drug-drug interactions:none  Screening: TB test: completed prior to drug initiation, reports that follow up testing has been negative.   Hepatitis: completed prior to drug initiation   Monitoring: S/sx of infection: none currently CBC: last CBC normal S/sx of hypersensitivity: none S/sx of malignancy: none S/sx of heart failure: no diagnosis of HF    O:     Lab Results  Component Value Date   WBC 5.7 10/01/2017   HGB 14.1 10/01/2017   HCT 41.6 10/01/2017   MCV 91.1 10/01/2017   PLT 178.0 10/01/2017      Chemistry      Component Value Date/Time   NA 140 10/01/2017 1235   NA 136 (A) 04/04/2015   K 4.2 10/01/2017 1235   CL 107 10/01/2017 1235   CO2 25 10/01/2017 1235   BUN 12 10/01/2017 1235   BUN 28 (A) 04/04/2015   CREATININE 1.20 10/01/2017 1235   GLU 143 04/04/2015      Component Value Date/Time   CALCIUM 9.5 10/01/2017 1235   ALKPHOS 59 09/27/2017 0411   AST 22 09/27/2017 0411   ALT 26 09/27/2017 0411   BILITOT 1.3 (H) 09/27/2017 0411       A/P: 1. Medication review: Patient on Humira for psoriatic arthritis and is tolerating it well with no adverse effects and reported control of psoriatic arthritis. His last metabolic panel was relatively normal but he had elevated SCr - there are no drug dose reductions in reduced renal function with Humira. No recent CBC, recommended that he get an updated CBC either through dermatology or his primary care provider. Patient verbalized understanding. I have faxed Dr. Tonia [name], the  dermatologist managing his psoriasis, for her most recent office note and any labs. No recommendations for any changes at this time.    James [name], PharmD, BCPS, BCACP, [facility] [phone]

## 2017-11-04 ENCOUNTER — Encounter: Payer: Self-pay | Admitting: Endocrinology

## 2017-11-04 ENCOUNTER — Ambulatory Visit (INDEPENDENT_AMBULATORY_CARE_PROVIDER_SITE_OTHER): Payer: 59 | Admitting: Endocrinology

## 2017-11-04 VITALS — BP 128/80 | HR 78 | Ht 71.0 in | Wt 257.6 lb

## 2017-11-04 DIAGNOSIS — E1165 Type 2 diabetes mellitus with hyperglycemia: Secondary | ICD-10-CM

## 2017-11-04 NOTE — Patient Instructions (Signed)
Less sweet tea  Check blood sugars on waking up  2-3/7  Also check blood sugars about 2 hours after a meal and do this after different meals by rotation  Recommended blood sugar levels on waking up is 90-130 and about 2 hours after meal is 130-160  Please bring your blood sugar monitor to each visit, thank you  Low Carb low fat diet

## 2017-11-04 NOTE — Progress Notes (Signed)
Patient ID: James [name], male   DOB: 01/03/1956, 62 y.o.   MRN: [id number]           Reason for Appointment: Follow-up for Type 2 Diabetes  Referring physician: [name]  History of Present Illness:          Date of diagnosis of type 2 diabetes mellitus:  ?  2014      Background history:  He is not clear how his diabetes was diagnosed, likely on routine testing. Initially had been treated with metformin and also tried on Amaryl Patient had progressive increase in his blood sugars since 1/17 with stopping metformin and being on the regimen of Amaryl and Januvia, he thinks his blood sugars went up to 601.  He was then started on basal bolus insulin   Recent history:   Non-insulin hypoglycemic drugs the patient is taking are: Trulicity 1.5 mg weekly, Metformin 1 g twice a day, Amaryl 70m  His A1c is significantly better at 6% compared to 7.3 on the last visit  Current management, blood sugar patterns and problems identified:  He did not bring his monitor for download  Not clear if he is checking his readings consistently especially after meals as he does not think his blood sugars are over 150  Previously has had higher sugars in the evenings and occasionally over 300 also  He again has had some issues with GI problems in March and may not have taken his metformin consistently but is back on it now  Although his lab glucose was 156 fasting he reports better readings at home  Again diet may not be consistently controlled and he may still have some sweet tea with his meals especially in the evening, this is prepared by his wife  Not able to do much physical activity but trying to increase  His weight is down possibly because of intercurrent illnesses  He was told to see the dietitian but he has not done so   Side  effects from medications have been: None  Compliance with the medical regimen: Fair  Glucose monitoring:  done <1 times a day         Glucometer:  Accu-Chek Blood  Glucose readings by recall  Recent range 108-150    Self-care:   Meal times are:  Breakfast is not always eaten.  Lunch: 2-3 PM Dinner: 5-6 PM         Dietician visit, most recent:09/2015               Exercise:  recently a little yard work  Weight history:  Wt Readings from Last 3 Encounters:  11/04/17 257 lb 9.6 oz (116.8 kg)  10/29/17 252 lb (114.3 kg)  10/01/17 252 lb 6 oz (114.5 kg)    Glycemic control:   Lab Results  Component Value Date   HGBA1C 6.0 11/01/2017   HGBA1C 7.3 (H) 07/15/2017   HGBA1C 6.2 03/17/2017   Lab Results  Component Value Date   MICROALBUR 1.9 07/15/2017   LDLCALC 137 (H) 09/07/2016   CREATININE 1.25 11/01/2017    Lab on 11/01/2017  Component Date Value Ref Range Status  . Cholesterol 11/01/2017 152  0 - 200 mg/dL Final   ATP III Classification       Desirable:  < 200 mg/dL               Borderline High:  200 - 239 mg/dL          High:  > = 240  mg/dL  . Triglycerides 11/01/2017 275.0* 0.0 - 149.0 mg/dL Final   Normal:  <150 mg/dLBorderline High:  150 - 199 mg/dL  . HDL 11/01/2017 36.70* >39.00 mg/dL Final  . VLDL 11/01/2017 55.0* 0.0 - 40.0 mg/dL Final  . Total CHOL/HDL Ratio 11/01/2017 4   Final                  Men          Women1/2 Average Risk     3.4          3.3Average Risk          5.0          4.42X Average Risk          9.6          7.13X Average Risk          15.0          11.0                      . NonHDL 11/01/2017 115.40   Final   NOTE:  Non-HDL goal should be 30 mg/dL higher than patient's LDL goal (i.e. LDL goal of < 70 mg/dL, would have non-HDL goal of < 100 mg/dL)  . Sodium 11/01/2017 139  135 - 145 mEq/L Final  . Potassium 11/01/2017 4.6  3.5 - 5.1 mEq/L Final  . Chloride 11/01/2017 107  96 - 112 mEq/L Final  . CO2 11/01/2017 22  19 - 32 mEq/L Final  . Glucose, Bld 11/01/2017 156* 70 - 99 mg/dL Final  . BUN 11/01/2017 20  6 - 23 mg/dL Final  . Creatinine, Ser 11/01/2017 1.25  0.40 - 1.50 mg/dL Final  . Total  Bilirubin 11/01/2017 0.6  0.2 - 1.2 mg/dL Final  . Alkaline Phosphatase 11/01/2017 68  39 - 117 U/L Final  . AST 11/01/2017 37  0 - 37 U/L Final  . ALT 11/01/2017 35  0 - 53 U/L Final  . Total Protein 11/01/2017 7.4  6.0 - 8.3 g/dL Final  . Albumin 11/01/2017 3.9  3.5 - 5.2 g/dL Final  . Calcium 11/01/2017 9.3  8.4 - 10.5 mg/dL Final  . GFR 11/01/2017 62.69  >60.00 mL/min Final  . Hgb A1c MFr Bld 11/01/2017 6.0  4.6 - 6.5 % Final   Glycemic Control Guidelines for People with Diabetes:Non Diabetic:  <6%Goal of Therapy: <7%Additional Action Suggested:  >8%   . Direct LDL 11/01/2017 83.0  mg/dL Final   Optimal:  <100 mg/dLNear or Above Optimal:  100-129 mg/dLBorderline High:  130-159 mg/dLHigh:  160-189 mg/dLVery High:  >190 mg/dL       Allergies as of 11/04/2017      Reactions   Bee Venom Swelling   Hydrocodone-homatropine Other (See Comments)   Depressed feeling   Morphine And Related Other (See Comments)   Hallucinations, back in the 80s. States has taken vicodin before w/o problems    Sulfa Drugs Cross Reactors Rash      Medication List        Accurate as of 11/04/17 12:00 PM. Always use your most recent med list.          acetaminophen 500 MG tablet Commonly known as:  TYLENOL Take 1,000 mg by mouth every 6 (six) hours as needed for mild pain or headache.   Adalimumab 40 MG/0.8ML Pnkt Commonly known as:  HUMIRA PEN Inject 0.8 mLs into the skin every 14 (fourteen) days.   atorvastatin  10 MG tablet Commonly known as:  LIPITOR TAKE 1 TABLET (10 MG TOTAL) BY MOUTH DAILY.   carvedilol 12.5 MG tablet Commonly known as:  COREG Take 1 tablet (12.5 mg total) by mouth 2 (two) times daily with a meal.   clonazePAM 0.5 MG tablet Commonly known as:  KLONOPIN Take 1 tablet (0.5 mg total) by mouth at bedtime.   DULoxetine 60 MG capsule Commonly known as:  CYMBALTA Take 1 capsule (60 mg total) by mouth 2 (two) times daily.   fenofibrate micronized 134 MG capsule Commonly  known as:  LOFIBRA TAKE 1 CAPSULE (134 MG TOTAL) BY MOUTH DAILY BEFORE BREAKFAST.   FREESTYLE FREEDOM LITE w/Device Kit Use to check blood sugars 3 times daily. Dx code: E11.9   freestyle lancets Use to check blood sugar 3 times daily. Dx code E11.9   gabapentin 600 MG tablet Commonly known as:  NEURONTIN Take 1 tablet (600 mg total) by mouth 3 (three) times daily.   glimepiride 1 MG tablet Commonly known as:  AMARYL TAKE 1 TABLET (1 MG TOTAL) BY MOUTH DAILY BEFORE SUPPER.   glucose blood test strip Commonly known as:  ACCU-CHEK GUIDE Use to test blood sugar three times daily   levETIRAcetam 750 MG tablet Commonly known as:  KEPPRA Take 1 tablet (750 mg total) by mouth 2 (two) times daily.   metFORMIN 1000 MG tablet Commonly known as:  GLUCOPHAGE Take 1 tablet (1,000 mg total) by mouth 2 (two) times daily with a meal.   omeprazole 40 MG capsule Commonly known as:  PRILOSEC Take 1 capsule (40 mg total) by mouth 2 (two) times daily before a meal.   TRULICITY 1.5 JF/3.5KT Sopn Generic drug:  Dulaglutide Inject 1.5 mg into the skin once a week.       Allergies:  Allergies  Allergen Reactions  . Bee Venom Swelling  . Hydrocodone-Homatropine Other (See Comments)    Depressed feeling  . Morphine And Related Other (See Comments)    Hallucinations, back in the 80s. States has taken vicodin before w/o problems   . Sulfa Drugs Cross Reactors Rash    Past Medical History:  Diagnosis Date  . Acid reflux   . Arthritis   . Cholelithiasis   . Chronic headaches    on cymbalta  . Cirrhosis ([place])   . Colon polyps   . Complication of anesthesia    problems waking up from anesthesia  . Depression    on cymbalta  . Diabetes mellitus with neuropathy ([place])    borderline per primary MD  . Diverticulitis 03/2013  . Eczema   . Elevated LFTs   . Fatty liver   . History of kidney stones   . Hypertension   . Insomnia 04/26/2013  . Kidney stone   . Morbid obesity ([place])   .  Neuromuscular disorder ([facility])    neuropathy  . Neuropathy   . Post-traumatic hydrocephalus    s/p shunts x 2 (first got infected )  . Psoriasis    sees Dr [name]  . Psoriatic arthritis ([place])   . 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 Sleep apnea    no CPAP     . Stomach ulcer   . Testosterone deficiency 04/28/2011    Past Surgical History:  Procedure Laterality Date  . BACK SURGERY  1980  . CHOLECYSTECTOMY  08/25/2017   laproscopic   . CHOLECYSTECTOMY N/A 08/25/2017   Procedure: LAPAROSCOPIC CHOLECYSTECTOMY WITH INTRAOPERATIVE CHOLANGIOGRAM;  Surgeon: [name], MD;  Location: [facility];  Service: General;  Laterality: N/A;  . SHOULDER SURGERY Left 2010  . TOE SURGERY Left 2018  . TOTAL HIP ARTHROPLASTY Left 2011  . VENTRICULOPERITONEAL SHUNT  2007   x2    Family History  Problem Relation Age of Onset  . Healthy Mother   . Lung cancer Father        alive, former smoker   . Heart disease Brother        MI age 15  . Other Brother        Murdered  . Down syndrome Son   . Diabetes Neg Hx   . Prostate cancer Neg Hx   . Colon cancer Neg Hx   . Stomach cancer Neg Hx     Social History:  reports that he has never smoked. He has never used smokeless tobacco. He reports that he does not drink alcohol or use drugs.    Review of Systems     HYPERTENSION: Blood pressure   is controlled Followed by PCP   BP Readings from Last 3 Encounters:  11/04/17 128/80  10/29/17 100/62  10/01/17 126/68    Lipid history: He is on Lipitor 10 mg with control of LDL and is followed by PCP Is on fenofibrate for triglyceride treatment, has increased triglycerides again He thinks he is regular with this    Lab Results  Component Value Date   CHOL 152 11/01/2017   HDL 36.70 (L) 11/01/2017   LDLCALC 137 (H) 09/07/2016   LDLDIRECT 83.0 11/01/2017   TRIG 275.0 (H) 11/01/2017   CHOLHDL 4 11/01/2017            Most recent foot exam:  08/2017  He has symptomatic neuropathy and is taking gabapentin twice a day and  Cymbalta, still has difficulties with pain Is followed by neurologist Also is having difficulties with balance and some falls  Review of Systems    Physical Examination:  BP 128/80 (BP Location: Left Arm, Patient Position: Sitting, Cuff Size: Normal)   Pulse 78   Ht 5' 11"  (1.803 m)   Wt 257 lb 9.6 oz (116.8 kg)   SpO2 98%   BMI 35.93 kg/m       ASSESSMENT:  Diabetes type 2 with obesity  See history of present illness for discussion of current diabetes management, blood sugar patterns and problems identified  Currently on a regimen of metformin, Trulicity and low-dose Amaryl  Although his A1c is only 6% his blood sugars probably are higher and fasting lab glucose was 156 He is likely not monitoring his blood sugar much and did not bring his meter  Neuropathy with sensory loss: He needs to be taking fall precautions  RENAL dysfunction: Improved    PLAN:    More consistent monitoring especially after meals  Keep reducing sweet drinks  No change in medications as yet  Discussed importance of weight loss especially with continued increase in triglycerides despite medications    Patient Instructions  Less sweet tea  Check blood sugars on waking up  2-3/7  Also check blood sugars about 2 hours after a meal and do this after different meals by rotation  Recommended blood sugar levels on waking up is 90-130 and about 2 hours after meal is 130-160  Please bring your blood sugar monitor to each visit, thank you  Low Carb low fat diet   Layne Lebon 11/04/2017, 12:00 PM   Note: This office note was prepared  with Estate agent. Any transcriptional errors that result from this process are unintentional.

## 2017-11-05 ENCOUNTER — Other Ambulatory Visit: Payer: Self-pay | Admitting: Endocrinology

## 2017-11-05 MED FILL — metFORMIN HCL 1000 MG TABS: 1000 | 30 days supply | Qty: 60 | Fill #0

## 2017-11-09 DIAGNOSIS — L409 Psoriasis, unspecified: Secondary | ICD-10-CM | POA: Diagnosis not present

## 2017-11-09 DIAGNOSIS — Z79899 Other long term (current) drug therapy: Secondary | ICD-10-CM | POA: Diagnosis not present

## 2017-11-11 ENCOUNTER — Other Ambulatory Visit: Payer: Self-pay | Admitting: Pharmacist

## 2017-11-11 MED ORDER — ADALIMUMAB 40 MG/0.8ML ~~LOC~~ AJKT: 0.8000 mL | AUTO-INJECTOR | SUBCUTANEOUS | 5 refills | Status: DC

## 2017-11-11 MED FILL — TRIAMCINOLONE 0.1% OINTMENT: 0.1 | 14 days supply | Qty: 454 | Fill #0

## 2017-11-11 MED FILL — HUMIRA PEN 40 MG/0.8ML PNKT: 40 | 28 days supply | Qty: 2 | Fill #0

## 2017-11-16 ENCOUNTER — Ambulatory Visit (INDEPENDENT_AMBULATORY_CARE_PROVIDER_SITE_OTHER): Payer: 59 | Admitting: Internal Medicine

## 2017-11-16 ENCOUNTER — Encounter: Payer: Self-pay | Admitting: Internal Medicine

## 2017-11-16 ENCOUNTER — Encounter: Payer: 59 | Admitting: Internal Medicine

## 2017-11-16 VITALS — BP 118/82 | HR 71 | Temp 97.9°F | Ht 71.0 in | Wt 259.1 lb

## 2017-11-16 DIAGNOSIS — Z23 Encounter for immunization: Secondary | ICD-10-CM | POA: Diagnosis not present

## 2017-11-16 DIAGNOSIS — Z0000 Encounter for general adult medical examination without abnormal findings: Secondary | ICD-10-CM

## 2017-11-16 DIAGNOSIS — Z125 Encounter for screening for malignant neoplasm of prostate: Secondary | ICD-10-CM

## 2017-11-16 LAB — PSA: PSA: 0.45 ng/mL (ref 0.10–4.00)

## 2017-11-16 MED FILL — clonazePAM 0.5 MG TABS: 0.5 | 30 days supply | Qty: 30 | Fill #2

## 2017-11-16 MED FILL — DULoxetine HCL 60 MG CPEP: 60 | 30 days supply | Qty: 60 | Fill #7

## 2017-11-16 NOTE — Progress Notes (Signed)
Pre visit review using our clinic review tool, if applicable. No additional management support is needed unless otherwise documented below in the visit note. 

## 2017-11-16 NOTE — Progress Notes (Signed)
Subjective:    Patient ID: James [name], male    DOB: Nov 09, 1955, 62 y.o.   MRN: [id number]  DOS:  11/16/2017 Type of visit - description : CPX  Interval history: Here for a CPX, he reports 3 falls in the last 2 weeks, he thinks associated with neuropathy as he has very little feeling on his feet. He bumped his head a couple of times, no LOC. At this point he is asymptomatic with no headache, nausea, vomiting or neck pain per se.   Review of Systems  Other than above, a 14 point review of systems is negative     Past Medical History:  Diagnosis Date  . Acid reflux   . Arthritis   . Chronic headaches    on cymbalta  . Cirrhosis ([place])   . Colon polyps   . Complication of anesthesia    problems waking up from anesthesia  . Depression    on cymbalta  . Diabetes mellitus with neuropathy ([place])    borderline per primary MD  . Diverticulitis 03/2013  . Eczema   . Elevated LFTs   . Fatty liver   . History of kidney stones   . Hypertension   . Insomnia 04/26/2013  . Kidney stone   . Morbid obesity ([place])   . Neuromuscular disorder ([facility])    neuropathy  . Neuropathy   . Post-traumatic hydrocephalus    s/p shunts x 2 (first got infected )  . Psoriasis    sees Dr [name]  . Psoriatic arthritis ([place])   . 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 Sleep apnea    no CPAP     . Stomach ulcer   . Testosterone deficiency 04/28/2011    Past Surgical History:  Procedure Laterality Date  . BACK SURGERY  1980  . CHOLECYSTECTOMY  08/25/2017   laproscopic   . CHOLECYSTECTOMY N/A 08/25/2017   Procedure: LAPAROSCOPIC CHOLECYSTECTOMY WITH INTRAOPERATIVE CHOLANGIOGRAM;  Surgeon: [name], MD;  Location: [facility];  Service: General;  Laterality: N/A;  . SHOULDER SURGERY Left 2010  . TOE SURGERY Left 2018  . TOTAL HIP ARTHROPLASTY Left 2011  . VENTRICULOPERITONEAL SHUNT  2007   x2    Social History   Socioeconomic History  . Marital status:  Married    Spouse name: [name]  . Number of children: 2  . Years of education: Not on file  . Highest education level: Not on file  Occupational History  . Occupation: disable   Social Needs  . Financial resource strain: Not on file  . Food insecurity:    Worry: Not on file    Inability: Not on file  . Transportation needs:    Medical: Not on file    Non-medical: Not on file  Tobacco Use  . Smoking status: Never Smoker  . Smokeless tobacco: Never Used  Substance and Sexual Activity  . Alcohol use: No  . Drug use: No  . Sexual activity: Yes    Partners: Female  Lifestyle  . Physical activity:    Days per week: Not on file    Minutes per session: Not on file  . Stress: Not on file  Relationships  . Social connections:    Talks on phone: Not on file    Gets together: Not on file    Attends religious service: Not on file    Active member of club or organization: Not on file    Attends meetings  of clubs or organizations: Not on file    Relationship status: Not on file  . Intimate partner violence:    Fear of current or ex partner: Not on file    Emotionally abused: Not on file    Physically abused: Not on file    Forced sexual activity: Not on file  Other Topics Concern  . 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     Family History  Problem Relation Age of Onset  . Healthy Mother   . Lung cancer Father        alive, former smoker   . Heart disease Brother        MI age 45  . Other Brother        Murdered  . Down syndrome Son   . Diabetes Neg Hx   . Prostate cancer Neg Hx   . Colon cancer Neg Hx   . Stomach cancer Neg Hx      Allergies as of 11/16/2017      Reactions   Bee Venom Swelling   Hydrocodone-homatropine Other (See Comments)   Depressed feeling   Morphine And Related Other (See  Comments)   Hallucinations, back in the 80s. States has taken vicodin before w/o problems    Sulfa Drugs Cross Reactors Rash      Medication List        Accurate as of 11/16/17 11:59 PM. Always use your most recent med list.          acetaminophen 500 MG tablet Commonly known as:  TYLENOL Take 1,000 mg by mouth every 6 (six) hours as needed for mild pain or headache.   Adalimumab 40 MG/0.8ML Pnkt Commonly known as:  HUMIRA PEN Inject 0.8 mLs into the skin every 14 (fourteen) days. Inject 40 mg (0.8 ml) under the skin every other week.   atorvastatin 10 MG tablet Commonly known as:  LIPITOR TAKE 1 TABLET (10 MG TOTAL) BY MOUTH DAILY.   carvedilol 12.5 MG tablet Commonly known as:  COREG Take 1 tablet (12.5 mg total) by mouth 2 (two) times daily with a meal.   clonazePAM 0.5 MG tablet Commonly known as:  KLONOPIN Take 1 tablet (0.5 mg total) by mouth at bedtime.   DULoxetine 60 MG capsule Commonly known as:  CYMBALTA Take 1 capsule (60 mg total) by mouth 2 (two) times daily.   fenofibrate micronized 134 MG capsule Commonly known as:  LOFIBRA TAKE 1 CAPSULE (134 MG TOTAL) BY MOUTH DAILY BEFORE BREAKFAST.   FREESTYLE FREEDOM LITE w/Device Kit Use to check blood sugars 3 times daily. Dx code: E11.9   freestyle lancets Use to check blood sugar 3 times daily. Dx code E11.9   gabapentin 600 MG tablet Commonly known as:  NEURONTIN Take 1 tablet (600 mg total) by mouth 3 (three) times daily.   glimepiride 1 MG tablet Commonly known as:  AMARYL TAKE 1 TABLET (1 MG TOTAL) BY MOUTH DAILY BEFORE SUPPER.   glucose blood test strip Commonly known as:  ACCU-CHEK GUIDE Use to test blood sugar three times daily   levETIRAcetam 750 MG tablet Commonly known as:  KEPPRA Take 1 tablet (750 mg total) by mouth 2 (two) times daily.   metFORMIN 1000 MG tablet Commonly known as:  GLUCOPHAGE Take 1 tablet (1,000 mg total) by  mouth 2 (two) times daily with a meal.   metFORMIN 1000  MG tablet Commonly known as:  GLUCOPHAGE TAKE 1 TABLET (1000 Mg) BY MOUTH TWICE DAILY WITH A MEAL   omeprazole 40 MG capsule Commonly known as:  PRILOSEC Take 1 capsule (40 mg total) by mouth 2 (two) times daily before a meal.   TRULICITY 1.5 UK/0.2RK Sopn Generic drug:  Dulaglutide Inject 1.5 mg into the skin once a week.          Objective:   Physical Exam BP 118/82 (BP Location: Left Arm, Patient Position: Sitting, Cuff Size: Large)   Pulse 71   Temp 97.9 F (36.6 C) (Oral)   Ht 5' 11"  (1.803 m)   Wt 259 lb 2 oz (117.5 kg)   SpO2 95%   BMI 36.14 kg/m  General:   Well developed, obese appearing. NAD.  Neck: No  thyromegaly  HEENT:  Normocephalic . Face symmetric, atraumatic Lungs:  CTA B Normal respiratory effort, no intercostal retractions, no accessory muscle use. Heart: RRR,  no murmur.  No pretibial edema bilaterally  Abdomen:  Not distended, soft, non-tender. No rebound or rigidity.   Skin: Exposed areas without rash. Not pale. Not jaundice Rectal: External abnormalities: none. Normal sphincter tone. No rectal masses or tenderness.  Brown stools Prostate: Prostate gland firm and smooth, no enlargement, nodularity, tenderness, mass, asymmetry or induration Neurologic:  alert & oriented X3.  Speech normal, gait appropriate for age and unassisted Strength symmetric and appropriate for age.  Psych: Cognition and judgment appear intact.  Cooperative with normal attention span and concentration.  Behavior appropriate. No anxious or depressed appearing.     Assessment & Plan:   Assessment  DM - Dr [name] Neuropathy (x years, rx gaba 05-2014, w/u 11-2014  RPR neg, vit D-B12-Folic Acid wnl ); Saw Dr Posey Pronto, [place] 1. The electrophysiologic findings are most consistent with a chronic, distal and symmetric sensorimotor polyneuropathy, predominantly axon loss in type, affecting the lower extremities. 2. There is no evidence of a superimposed lumbosacral  radiculopathy. CRI HTN Hyperlipidemia (TG in the 500s 2016) OSA , dx 2012, sleep study again 02-2015 Dr Dohmeier--> severe OSA, intolerant to CPAP Depression, insomnia ----on Cymbalta NEURO: --Chronic headaches -----on Cymbalta --Posttraumatic hydrocephalus s/p 2 shunts (first got infected) MSK: on disability d/t back pain- HAs Hypogonadism  Dx 2012, normal T 11-2014 (on no RX) GI:  --GERD, diverticulitis 2014, h/o PUD -- NASH with cirrhosis per GI note 10/2017, s/p Hep A/B shots Psoriasis, psoriatic arthritis -- on HUMIRA  H/u urolithiasis +FH CAD brother MI age 44  PLAN DM, HTN, high cholesterol: Controlled on current meds.  Recommend ambulatory BPs Gait disorder, frequent falls: Recommend PT. Fall prevention discussed.  Declining PT referral, encouraged to use a cane which he has.  DM: saw endocrinology 11/04/17; no changes made last A1c few days ago 6.0 Cirrhosis: Saw GI 10/29/2017, note reviewed, they plan to follow-up every 6 months RTC 1 year, he sees multiple providers.

## 2017-11-16 NOTE — Patient Instructions (Addendum)
GO TO THE LAB : Get the blood work     GO TO THE FRONT DESK Schedule your next appointment for a physical exam in 1 year   Check the  blood pressure 2 or 3 times a month   Be sure your blood pressure is between 110/65 and  135/85. If it is consistently higher or lower, let me know  Fall Prevention in the Home Falls can cause injuries and can affect people from all age groups. There are many simple things that you can do to make your home safe and to help prevent falls. What can I do on the outside of my home?  Regularly repair the edges of walkways and driveways and fix any cracks.  Remove high doorway thresholds.  Trim any shrubbery on the main path into your home.  Use bright outdoor lighting.  Clear walkways of debris and clutter, including tools and rocks.  Regularly check that handrails are securely fastened and in good repair. Both sides of any steps should have handrails.  Install guardrails along the edges of any raised decks or porches.  Have leaves, snow, and ice cleared regularly.  Use sand or salt on walkways during winter months.  In the garage, clean up any spills right away, including grease or oil spills. What can I do in the bathroom?  Use night lights.  Install grab bars by the toilet and in the tub and shower. Do not use towel bars as grab bars.  Use non-skid mats or decals on the floor of the tub or shower.  If you need to sit down while you are in the shower, use a plastic, non-slip stool.  Keep the floor dry. Immediately clean up any water that spills on the floor.  Remove soap buildup in the tub or shower on a regular basis.  Attach bath mats securely with double-sided non-slip rug tape.  Remove throw rugs and other tripping hazards from the floor. What can I do in the bedroom?  Use night lights.  Make sure that a bedside light is easy to reach.  Do not use oversized bedding that drapes onto the floor.  Have a firm chair that has side  arms to use for getting dressed.  Remove throw rugs and other tripping hazards from the floor. What can I do in the kitchen?  Clean up any spills right away.  Avoid walking on wet floors.  Place frequently used items in easy-to-reach places.  If you need to reach for something above you, use a sturdy step stool that has a grab bar.  Keep electrical cables out of the way.  Do not use floor polish or wax that makes floors slippery. If you have to use wax, make sure that it is non-skid floor wax.  Remove throw rugs and other tripping hazards from the floor. What can I do in the stairways?  Do not leave any items on the stairs.  Make sure that there are handrails on both sides of the stairs. Fix handrails that are broken or loose. Make sure that handrails are as long as the stairways.  Check any carpeting to make sure that it is firmly attached to the stairs. Fix any carpet that is loose or worn.  Avoid having throw rugs at the top or bottom of stairways, or secure the rugs with carpet tape to prevent them from moving.  Make sure that you have a light switch at the top of the stairs and the bottom of the  stairs. If you do not have them, have them installed. What are some other fall prevention tips?  Wear closed-toe shoes that fit well and support your feet. Wear shoes that have rubber soles or low heels.  When you use a stepladder, make sure that it is completely opened and that the sides are firmly locked. Have someone hold the ladder while you are using it. Do not climb a closed stepladder.  Add color or contrast paint or tape to grab bars and handrails in your home. Place contrasting color strips on the first and last steps.  Use mobility aids as needed, such as canes, walkers, scooters, and crutches.  Turn on lights if it is dark. Replace any light bulbs that burn out.  Set up furniture so that there are clear paths. Keep the furniture in the same spot.  Fix any uneven  floor surfaces.  Choose a carpet design that does not hide the edge of steps of a stairway.  Be aware of any and all pets.  Review your medicines with your healthcare provider. Some medicines can cause dizziness or changes in blood pressure, which increase your risk of falling. Talk with your health care provider about other ways that you can decrease your risk of falls. This may include working with a physical therapist or trainer to improve your strength, balance, and endurance. This information is not intended to replace advice given to you by your health care provider. Make sure you discuss any questions you have with your health care provider. Document Released: 06/05/2002 Document Revised: 11/12/2015 Document Reviewed: 07/20/2014 Elsevier Interactive Patient Education  [employer].

## 2017-11-16 NOTE — Assessment & Plan Note (Addendum)
--   Td 2014; PNM 23 today, PNM 13 at age 62 per guidelines ; shingrex not available  --Colonoscopy 07-2013, Dr. Henrene Pastor, had a polyp, next per GI --DRE wnl, check a   PSA --Diet and exercise discussed --Recent labs reviewed, all okay, needs a PSA

## 2017-11-17 NOTE — Assessment & Plan Note (Signed)
DM, HTN, high cholesterol: Controlled on current meds.  Recommend ambulatory BPs Gait disorder, frequent falls: Recommend PT. Fall prevention discussed.  Declining PT referral, encouraged to use a cane which he has.  DM: saw endocrinology 11/04/17; no changes made last A1c few days ago 6.0 Cirrhosis: Saw GI 10/29/2017, note reviewed, they plan to follow-up every 6 months RTC 1 year, he sees multiple providers.

## 2017-11-29 ENCOUNTER — Encounter: Payer: Self-pay | Admitting: Internal Medicine

## 2017-11-29 ENCOUNTER — Ambulatory Visit (INDEPENDENT_AMBULATORY_CARE_PROVIDER_SITE_OTHER): Payer: 59 | Admitting: Internal Medicine

## 2017-11-29 VITALS — BP 128/80 | HR 79 | Temp 97.8°F | Resp 16 | Ht 71.0 in | Wt 259.4 lb

## 2017-11-29 DIAGNOSIS — J40 Bronchitis, not specified as acute or chronic: Secondary | ICD-10-CM

## 2017-11-29 MED ORDER — BENZONATATE 200 MG PO CAPS: 200.0000 mg | ORAL_CAPSULE | Freq: Three times a day (TID) | ORAL | 0 refills | Status: DC | PRN

## 2017-11-29 MED ORDER — DOXYCYCLINE HYCLATE 100 MG PO TABS: 100.0000 mg | ORAL_TABLET | Freq: Two times a day (BID) | ORAL | 0 refills | Status: DC

## 2017-11-29 MED FILL — DOXYCYCLINE HYCLATE 100 MG: 100 | 8 days supply | Qty: 15 | Fill #0

## 2017-11-29 MED FILL — BENZONATATE 200 MG CAP: 200 | 10 days supply | Qty: 30 | Fill #0

## 2017-11-29 NOTE — Patient Instructions (Addendum)
Rest, fluids , tylenol  For cough:  Take Mucinex DM twice a day as needed until better Continue with Lewayne Bunting   Take the antibiotic as prescribed  (doxy)  Call if not gradually better over the next  10 days  Call anytime if the symptoms are severe, you have high fever, short of breath, chest pain

## 2017-11-29 NOTE — Progress Notes (Signed)
Pre visit review using our clinic review tool, if applicable. No additional management support is needed unless otherwise documented below in the visit note. 

## 2017-11-29 NOTE — Progress Notes (Signed)
Subjective:    Patient ID: James [name], male    DOB: 01-18-1956, 62 y.o.   MRN: [id number]  DOS:  11/29/2017 Type of visit - description : acute Interval history: Symptoms started 2 days ago with cough. no having any other symptoms. Taking NyQuil and leftover Tessalon Perles with mild help.   Review of Systems No fever chills, no sick contacts. No sputum production Some runny nose but otherwise sinuses are okay. No history of asthma or wheezing  Past Medical History:  Diagnosis Date  . Acid reflux   . Arthritis   . Chronic headaches    on cymbalta  . Cirrhosis ([place])   . Colon polyps   . Complication of anesthesia    problems waking up from anesthesia  . Depression    on cymbalta  . Diabetes mellitus with neuropathy ([place])    borderline per primary MD  . Diverticulitis 03/2013  . Eczema   . Elevated LFTs   . Fatty liver   . History of kidney stones   . Hypertension   . Insomnia 04/26/2013  . Kidney stone   . Morbid obesity ([place])   . Neuromuscular disorder ([facility])    neuropathy  . Neuropathy   . Post-traumatic hydrocephalus    s/p shunts x 2 (first got infected )  . Psoriasis    sees Dr [name]  . Psoriatic arthritis ([place])   . 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 Sleep apnea    no CPAP     . Stomach ulcer   . Testosterone deficiency 04/28/2011    Past Surgical History:  Procedure Laterality Date  . BACK SURGERY  1980  . CHOLECYSTECTOMY  08/25/2017   laproscopic   . CHOLECYSTECTOMY N/A 08/25/2017   Procedure: LAPAROSCOPIC CHOLECYSTECTOMY WITH INTRAOPERATIVE CHOLANGIOGRAM;  Surgeon: [name], MD;  Location: [facility];  Service: General;  Laterality: N/A;  . SHOULDER SURGERY Left 2010  . TOE SURGERY Left 2018  . TOTAL HIP ARTHROPLASTY Left 2011  . VENTRICULOPERITONEAL SHUNT  2007   x2    Social History   Socioeconomic History  . Marital status: Married    Spouse name: [name]  . Number of children: 2  . Years  of education: Not on file  . Highest education level: Not on file  Occupational History  . Occupation: disable   Social Needs  . Financial resource strain: Not on file  . Food insecurity:    Worry: Not on file    Inability: Not on file  . Transportation needs:    Medical: Not on file    Non-medical: Not on file  Tobacco Use  . Smoking status: Never Smoker  . Smokeless tobacco: Never Used  Substance and Sexual Activity  . Alcohol use: No  . Drug use: No  . Sexual activity: Yes    Partners: Female  Lifestyle  . Physical activity:    Days per week: Not on file    Minutes per session: Not on file  . Stress: Not on file  Relationships  . Social connections:    Talks on phone: Not on file    Gets together: Not on file    Attends religious service: Not on file    Active member of club or organization: Not on file    Attends meetings of clubs or organizations: Not on file    Relationship status: Not on file  . Intimate partner violence:    Fear of  current or ex partner: Not on file    Emotionally abused: Not on file    Physically abused: Not on file    Forced sexual activity: Not on file  Other Topics Concern  . 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      Allergies as of 11/29/2017      Reactions   Bee Venom Swelling   Hydrocodone-homatropine Other (See Comments)   Depressed feeling   Morphine And Related Other (See Comments)   Hallucinations, back in the 80s. States has taken vicodin before w/o problems    Sulfa Drugs Cross Reactors Rash      Medication List        Accurate as of 11/29/17 11:59 PM. Always use your most recent med list.          acetaminophen 500 MG tablet Commonly known as:  TYLENOL Take 1,000 mg by mouth every 6 (six) hours as needed for mild pain or headache.   Adalimumab 40  MG/0.8ML Pnkt Commonly known as:  HUMIRA PEN Inject 0.8 mLs into the skin every 14 (fourteen) days. Inject 40 mg (0.8 ml) under the skin every other week.   atorvastatin 10 MG tablet Commonly known as:  LIPITOR TAKE 1 TABLET (10 MG TOTAL) BY MOUTH DAILY.   benzonatate 200 MG capsule Commonly known as:  TESSALON Take 1 capsule (200 mg total) by mouth 3 (three) times daily as needed for cough.   carvedilol 12.5 MG tablet Commonly known as:  COREG Take 1 tablet (12.5 mg total) by mouth 2 (two) times daily with a meal.   clonazePAM 0.5 MG tablet Commonly known as:  KLONOPIN Take 1 tablet (0.5 mg total) by mouth at bedtime.   doxycycline 100 MG tablet Commonly known as:  VIBRA-TABS Take 1 tablet (100 mg total) by mouth 2 (two) times daily.   DULoxetine 60 MG capsule Commonly known as:  CYMBALTA Take 1 capsule (60 mg total) by mouth 2 (two) times daily.   fenofibrate micronized 134 MG capsule Commonly known as:  LOFIBRA TAKE 1 CAPSULE (134 MG TOTAL) BY MOUTH DAILY BEFORE BREAKFAST.   FREESTYLE FREEDOM LITE w/Device Kit Use to check blood sugars 3 times daily. Dx code: E11.9   freestyle lancets Use to check blood sugar 3 times daily. Dx code E11.9   gabapentin 600 MG tablet Commonly known as:  NEURONTIN Take 1 tablet (600 mg total) by mouth 3 (three) times daily.   glimepiride 1 MG tablet Commonly known as:  AMARYL TAKE 1 TABLET (1 MG TOTAL) BY MOUTH DAILY BEFORE SUPPER.   glucose blood test strip Commonly known as:  ACCU-CHEK GUIDE Use to test blood sugar three times daily   levETIRAcetam 750 MG tablet Commonly known as:  KEPPRA Take 1 tablet (750 mg total) by mouth 2 (two) times daily.   metFORMIN 1000 MG tablet Commonly known as:  GLUCOPHAGE Take 1 tablet (1,000 mg total) by mouth 2 (two) times daily with a meal.   metFORMIN 1000 MG tablet Commonly known as:  GLUCOPHAGE TAKE 1 TABLET (1000 Mg) BY MOUTH TWICE DAILY WITH A MEAL   omeprazole 40 MG  capsule Commonly known as:  PRILOSEC Take 1 capsule (40 mg total) by mouth 2 (two) times daily before a meal.   TRULICITY 1.5 GY/5.6LS Sopn Generic drug:  Dulaglutide Inject 1.5 mg into the skin once a week.          Objective:   Physical Exam BP 128/80 (BP Location: Left Arm, Patient Position: Sitting, Cuff Size: Normal)   Pulse 79   Temp 97.8 F (36.6 C) (Oral)   Resp 16   Ht _0  (1.803 m)   Wt 259 lb 6 oz (117.7 kg)   SpO2 97%   BMI 36.18 kg/m  General:   Well developed, well nourished . NAD.  Spasmodic cough noted with prolonged expiration time. HEENT:  Normocephalic . Face symmetric, atraumatic.  TMs normal, throat symmetric and not red.  Nose not congested  lungs:  No wheezing, crackles.  Few rhonchi that clear with cough Normal respiratory effort, no intercostal retractions, no accessory muscle use. Heart: RRR,  no murmur.  No pretibial edema bilaterally  Skin: Not pale. Not jaundice Neurologic:  alert & oriented X3.  Speech normal, gait appropriate for age and unassisted Psych--  Cognition and judgment appear intact.  Cooperative with normal attention span and concentration.  Behavior appropriate. No anxious or depressed appearing.      Assessment & Plan:    Assessment  DM - Dr [name] Neuropathy (x years, rx gaba 05-2014, w/u 11-2014  RPR neg, vit D-B12-Folic Acid wnl ); Saw Dr Posey Pronto, [place] 1. The electrophysiologic findings are most consistent with a chronic, distal and symmetric sensorimotor polyneuropathy, predominantly axon loss in type, affecting the lower extremities. 2. There is no evidence of a superimposed lumbosacral radiculopathy. CRI HTN Hyperlipidemia (TG in the 500s 2016) OSA , dx 2012, sleep study again 02-2015 Dr Dohmeier--> severe OSA, intolerant to CPAP Depression, insomnia ----on Cymbalta NEURO: --Chronic headaches -----on Cymbalta --Posttraumatic hydrocephalus s/p 2 shunts (first got infected) MSK: on disability d/t back  pain- HAs Hypogonadism  Dx 2012, normal T 11-2014 (on no RX) GI:  --GERD, diverticulitis 2014, h/o PUD -- NASH with cirrhosis per GI note 10/2017, s/p Hep A/B shots Psoriasis, psoriatic arthritis -- on HUMIRA  H/u urolithiasis +FH CAD brother MI age 74  PLAN Bronchitis: Cough for a couple of days, seems to be a spasmodic cough without wheezing.  Patient is immunosuppressed.  Atypical bronchitis?  Will cover him with doxycycline, cough control with Mucinex DM and Tessalon Perles that seemed to help some.  He is intolerant to morphine and related meds. Definitely call if not gradually improving.  See AVS.

## 2017-11-30 NOTE — Assessment & Plan Note (Signed)
Bronchitis: Cough for a couple of days, seems to be a spasmodic cough without wheezing.  Patient is immunosuppressed.  Atypical bronchitis?  Will cover him with doxycycline, cough control with Mucinex DM and Tessalon Perles that seemed to help some.  He is intolerant to morphine and related meds. Definitely call if not gradually improving.  See AVS.

## 2017-12-03 ENCOUNTER — Other Ambulatory Visit: Payer: Self-pay | Admitting: Endocrinology

## 2017-12-03 MED FILL — metFORMIN HCL 1000 MG TABS: 1000 | 30 days supply | Qty: 60 | Fill #1

## 2017-12-03 MED FILL — GLIMEPIRIDE 1 MG TABLET: 1 | 30 days supply | Qty: 30 | Fill #0

## 2017-12-03 MED FILL — ATORVASTATIN 10 MG TABLET: 10 | 90 days supply | Qty: 90 | Fill #1

## 2017-12-03 MED FILL — levETIRAcetam 750 MG TABS: 750 | 30 days supply | Qty: 60 | Fill #8

## 2017-12-03 MED FILL — TRULICITY 1.5 MG/0.5 ML PEN: 1.5 | 28 days supply | Qty: 2 | Fill #2

## 2017-12-03 MED FILL — CARVEDILOL 12.5 MG TABLET: 12.5 | 30 days supply | Qty: 60 | Fill #5

## 2017-12-03 MED FILL — HUMIRA PEN 40 MG/0.8ML PNKT: 40 | 28 days supply | Qty: 2 | Fill #1

## 2017-12-23 MED FILL — clonazePAM 0.5 MG TABS: 0.5 | 30 days supply | Qty: 30 | Fill #3

## 2017-12-23 MED FILL — DULoxetine HCL 60 MG CPEP: 60 | 30 days supply | Qty: 60 | Fill #8

## 2017-12-24 MED FILL — HUMIRA PEN 40 MG/0.8ML PNKT: 40 | 28 days supply | Qty: 2 | Fill #2

## 2017-12-29 MED FILL — GABAPENTIN 600 MG TABLET: 600 | 30 days supply | Qty: 90 | Fill #4

## 2017-12-29 MED FILL — GLIMEPIRIDE 1 MG TABLET: 1 | 30 days supply | Qty: 30 | Fill #1

## 2017-12-29 MED FILL — levETIRAcetam 750 MG TABS: 750 | 30 days supply | Qty: 60 | Fill #9

## 2018-01-10 MED FILL — metFORMIN HCL 1000 MG TABS: 1000 | 30 days supply | Qty: 60 | Fill #2

## 2018-01-19 ENCOUNTER — Other Ambulatory Visit: Payer: Self-pay | Admitting: Internal Medicine

## 2018-01-19 ENCOUNTER — Other Ambulatory Visit: Payer: Self-pay

## 2018-01-19 MED ORDER — EPINEPHRINE 0.3 MG/0.3ML IJ SOAJ: 0.3000 mg | Freq: Once | INTRAMUSCULAR | 1 refills | Status: DC | PRN

## 2018-01-19 MED FILL — EPINEPHRINE 0.3 MG AUTO-INJ: 0.3 | 2 days supply | Qty: 2 | Fill #0

## 2018-01-19 MED FILL — clonazePAM 0.5 MG TABS: 0.5 | 30 days supply | Qty: 30 | Fill #4

## 2018-01-19 MED FILL — DULoxetine HCL 60 MG CPEP: 60 | 90 days supply | Qty: 180 | Fill #0

## 2018-01-28 ENCOUNTER — Other Ambulatory Visit (INDEPENDENT_AMBULATORY_CARE_PROVIDER_SITE_OTHER): Payer: 59

## 2018-01-28 DIAGNOSIS — I10 Essential (primary) hypertension: Secondary | ICD-10-CM | POA: Diagnosis not present

## 2018-01-28 DIAGNOSIS — E1165 Type 2 diabetes mellitus with hyperglycemia: Secondary | ICD-10-CM

## 2018-01-28 DIAGNOSIS — N183 Chronic kidney disease, stage 3 (moderate): Secondary | ICD-10-CM | POA: Diagnosis not present

## 2018-01-28 LAB — BASIC METABOLIC PANEL
BUN: 17 mg/dL (ref 6–23)
CO2: 22 mEq/L (ref 19–32)
Calcium: 9.3 mg/dL (ref 8.4–10.5)
Chloride: 101 mEq/L (ref 96–112)
Creatinine, Ser: 1.26 mg/dL (ref 0.40–1.50)
GFR: 62.07 mL/min (ref >60.00)
Glucose, Bld: 265 mg/dL — ABNORMAL HIGH (ref 70–99)
POTASSIUM: 4.7 meq/L (ref 3.5–5.1)
Sodium: 135 mEq/L (ref 135–145)

## 2018-01-28 LAB — HEMOGLOBIN A1C: HEMOGLOBIN A1C: 6.6 % — AB (ref 4.6–6.5)

## 2018-02-01 ENCOUNTER — Other Ambulatory Visit: Payer: 59

## 2018-02-02 MED FILL — levETIRAcetam 750 MG TABS: 750 | 30 days supply | Qty: 60 | Fill #0

## 2018-02-02 MED FILL — GLIMEPIRIDE 1 MG TABLET: 1 | 30 days supply | Qty: 30 | Fill #2

## 2018-02-02 MED FILL — HUMIRA PEN 40 MG/0.8ML PNKT: 40 | 28 days supply | Qty: 2 | Fill #3

## 2018-02-03 ENCOUNTER — Encounter: Payer: 59 | Admitting: Internal Medicine

## 2018-02-04 ENCOUNTER — Ambulatory Visit (INDEPENDENT_AMBULATORY_CARE_PROVIDER_SITE_OTHER): Payer: 59 | Admitting: Endocrinology

## 2018-02-04 ENCOUNTER — Encounter: Payer: Self-pay | Admitting: Endocrinology

## 2018-02-04 VITALS — BP 124/82 | HR 82 | Ht 71.0 in | Wt 258.2 lb

## 2018-02-04 DIAGNOSIS — E1165 Type 2 diabetes mellitus with hyperglycemia: Secondary | ICD-10-CM | POA: Diagnosis not present

## 2018-02-04 LAB — GLUCOSE, POCT (MANUAL RESULT ENTRY): POC Glucose: 159 mg/dl — AB (ref 70–99)

## 2018-02-04 NOTE — Patient Instructions (Signed)
Get new battery   Less Cokes  Check blood sugars on waking up  2-3/7  Also check blood sugars about 2 hours after a meal and do this after different meals by rotation  Recommended blood sugar levels on waking up is 90-130 and about 2 hours after meal is 130-160  Please bring your blood sugar monitor to each visit, thank you

## 2018-02-04 NOTE — Progress Notes (Signed)
Patient ID: James [name], male   DOB: 1955/12/14, 62 y.o.   MRN: [id number]           Reason for Appointment: Follow-up for Type 2 Diabetes  Referring physician: [name]  History of Present Illness:          Date of diagnosis of type 2 diabetes mellitus:  ?  2014      Background history:  He is not clear how his diabetes was diagnosed, likely on routine testing. Initially had been treated with metformin and also tried on Amaryl Patient had progressive increase in his blood sugars since 1/17 with stopping metformin and being on the regimen of Amaryl and Januvia, he thinks his blood sugars went up to 601.  He was then started on basal bolus insulin   Recent history:   Non-insulin hypoglycemic drugs the patient is taking are: Trulicity 1.5 mg weekly, Metformin 1 g twice a day, Amaryl 60m  His A1c is somewhat higher again at 6.6 compared to 6% before   Current management, blood sugar patterns and problems identified:  He did not bring his monitor for download again and he says he is needing a new battery  Again not sure how often he is checking his blood sugars and he does not think his blood sugars are high  However his lab glucose was 265, likely after drinking a regular soft drink and he cannot tolerate diet drinks  Today fasting reading is over 150  Despite being fairly active now he is not able to lose weight  Has been reluctant to see the dietitian in the past  Appears to have higher readings than expected for his A1c  Side  effects from medications have been: None  Compliance with the medical regimen: Fair  Glucose monitoring:  done <1 times a day         Glucometer:  Accu-Chek Blood Glucose readings not available and patient does not remember his readings  Self-care:   Meal times are:  Breakfast is not always eaten.  Lunch: 2-3 PM Dinner: 5-6 PM         Dietician visit, most recent:09/2015               Exercise:  recently a little yard work  Weight  history:  Wt Readings from Last 3 Encounters:  02/04/18 258 lb 3.2 oz (117.1 kg)  11/29/17 259 lb 6 oz (117.7 kg)  11/16/17 259 lb 2 oz (117.5 kg)    Glycemic control:   Lab Results  Component Value Date   HGBA1C 6.6 (H) 01/28/2018   HGBA1C 6.0 11/01/2017   HGBA1C 7.3 (H) 07/15/2017   Lab Results  Component Value Date   MICROALBUR 1.9 07/15/2017   LDLCALC 137 (H) 09/07/2016   CREATININE 1.26 01/28/2018    Office Visit on 02/04/2018  Component Date Value Ref Range Status  . POC Glucose 02/04/2018 159* 70 - 99 mg/dl Final       Allergies as of 02/04/2018      Reactions   Bee Venom Swelling   Hydrocodone-homatropine Other (See Comments)   Depressed feeling   Morphine And Related Other (See Comments)   Hallucinations, back in the 80s. States has taken vicodin before w/o problems    Sulfa Drugs Cross Reactors Rash      Medication List        Accurate as of 02/04/18  9:23 AM. Always use your most recent med list.  acetaminophen 500 MG tablet Commonly known as:  TYLENOL Take 1,000 mg by mouth every 6 (six) hours as needed for mild pain or headache.   Adalimumab 40 MG/0.8ML Pnkt Inject 0.8 mLs into the skin every 14 (fourteen) days. Inject 40 mg (0.8 ml) under the skin every other week.   atorvastatin 10 MG tablet Commonly known as:  LIPITOR TAKE 1 TABLET (10 MG TOTAL) BY MOUTH DAILY.   benzonatate 200 MG capsule Commonly known as:  TESSALON Take 1 capsule (200 mg total) by mouth 3 (three) times daily as needed for cough.   carvedilol 12.5 MG tablet Commonly known as:  COREG Take 1 tablet (12.5 mg total) by mouth 2 (two) times daily with a meal.   clonazePAM 0.5 MG tablet Commonly known as:  KLONOPIN Take 1 tablet (0.5 mg total) by mouth at bedtime.   doxycycline 100 MG tablet Commonly known as:  VIBRA-TABS Take 1 tablet (100 mg total) by mouth 2 (two) times daily.   DULoxetine 60 MG capsule Commonly known as:  CYMBALTA Take 1 capsule (60 mg  total) by mouth 2 (two) times daily.   EPINEPHrine 0.3 mg/0.3 mL Soaj injection Commonly known as:  EPI-PEN Inject 0.3 mLs (0.3 mg total) into the muscle once as needed for up to 1 dose.   fenofibrate micronized 134 MG capsule Commonly known as:  LOFIBRA TAKE 1 CAPSULE (134 MG TOTAL) BY MOUTH DAILY BEFORE BREAKFAST.   FREESTYLE FREEDOM LITE w/Device Kit Use to check blood sugars 3 times daily. Dx code: E11.9   freestyle lancets Use to check blood sugar 3 times daily. Dx code E11.9   gabapentin 600 MG tablet Commonly known as:  NEURONTIN Take 1 tablet (600 mg total) by mouth 3 (three) times daily.   glimepiride 1 MG tablet Commonly known as:  AMARYL TAKE 1 TABLET (1 MG TOTAL) BY MOUTH DAILY BEFORE SUPPER.   glucose blood test strip Use to test blood sugar three times daily   levETIRAcetam 750 MG tablet Commonly known as:  KEPPRA Take 1 tablet (750 mg total) by mouth 2 (two) times daily.   metFORMIN 1000 MG tablet Commonly known as:  GLUCOPHAGE Take 1 tablet (1,000 mg total) by mouth 2 (two) times daily with a meal.   metFORMIN 1000 MG tablet Commonly known as:  GLUCOPHAGE TAKE 1 TABLET (1000 Mg) BY MOUTH TWICE DAILY WITH A MEAL   omeprazole 40 MG capsule Commonly known as:  PRILOSEC Take 1 capsule (40 mg total) by mouth 2 (two) times daily before a meal.   TRULICITY 1.5 IE/3.3IR Sopn Generic drug:  Dulaglutide Inject 1.5 mg into the skin once a week.       Allergies:  Allergies  Allergen Reactions  . Bee Venom Swelling  . Hydrocodone-Homatropine Other (See Comments)    Depressed feeling  . Morphine And Related Other (See Comments)    Hallucinations, back in the 80s. States has taken vicodin before w/o problems   . Sulfa Drugs Cross Reactors Rash    Past Medical History:  Diagnosis Date  . Acid reflux   . Arthritis   . Chronic headaches    on cymbalta  . Cirrhosis ([place])   . Colon polyps   . Complication of anesthesia    problems waking up from  anesthesia  . Depression    on cymbalta  . Diabetes mellitus with neuropathy ([place])    borderline per primary MD  . Diverticulitis 03/2013  . Eczema   . Elevated LFTs   .  Fatty liver   . History of kidney stones   . Hypertension   . Insomnia 04/26/2013  . Kidney stone   . Morbid obesity ([place])   . Neuromuscular disorder ([facility])    neuropathy  . Neuropathy   . Post-traumatic hydrocephalus    s/p shunts x 2 (first got infected )  . Psoriasis    sees Dr [name]  . Psoriatic arthritis ([place])   . 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 Sleep apnea    no CPAP     . Stomach ulcer   . Testosterone deficiency 04/28/2011    Past Surgical History:  Procedure Laterality Date  . BACK SURGERY  1980  . CHOLECYSTECTOMY  08/25/2017   laproscopic   . CHOLECYSTECTOMY N/A 08/25/2017   Procedure: LAPAROSCOPIC CHOLECYSTECTOMY WITH INTRAOPERATIVE CHOLANGIOGRAM;  Surgeon: [name], MD;  Location: [facility];  Service: General;  Laterality: N/A;  . SHOULDER SURGERY Left 2010  . TOE SURGERY Left 2018  . TOTAL HIP ARTHROPLASTY Left 2011  . VENTRICULOPERITONEAL SHUNT  2007   x2    Family History  Problem Relation Age of Onset  . Healthy Mother   . Lung cancer Father        alive, former smoker   . Heart disease Brother        MI age 77  . Other Brother        Murdered  . Down syndrome Son   . Diabetes Neg Hx   . Prostate cancer Neg Hx   . Colon cancer Neg Hx   . Stomach cancer Neg Hx     Social History:  reports that he has never smoked. He has never used smokeless tobacco. He reports that he does not drink alcohol or use drugs.    Review of Systems     HYPERTENSION: Blood pressure  is controlled Followed by PCP   BP Readings from Last 3 Encounters:  02/04/18 124/82  11/29/17 128/80  11/16/17 118/82    Lipid history: He is on Lipitor 10 mg with control of LDL and is followed by PCP Is on fenofibrate for triglyceride treatment, has  increased triglycerides consistently     Lab Results  Component Value Date   CHOL 152 11/01/2017   HDL 36.70 (L) 11/01/2017   LDLCALC 137 (H) 09/07/2016   LDLDIRECT 83.0 11/01/2017   TRIG 275.0 (H) 11/01/2017   CHOLHDL 4 11/01/2017            Most recent foot exam: 08/2017  He has symptomatic neuropathy and is taking gabapentin twice a day and  Cymbalta, still has difficulties with pain was followed by neurologist Also is having difficulties with balance and some falls  Review of Systems    Physical Examination:  BP 124/82 (BP Location: Left Arm, Patient Position: Sitting, Cuff Size: Normal)   Pulse 82   Ht 5' 11"  (1.803 m)   Wt 258 lb 3.2 oz (117.1 kg)   SpO2 97%   BMI 36.01 kg/m       ASSESSMENT:  Diabetes type 2 with obesity  See history of present illness for discussion of current diabetes management, blood sugar patterns and problems identified  Currently on a regimen of metformin, Trulicity and low-dose Amaryl  Although his A1c is only 6.6 his lab glucose readings are usually higher Most of his hypoglycemia is related to poor diet, regular soft drinks and not having balanced meal He is not able to lose weight  Discussed that he will likely have some progression of his diabetes, already on 3 drugs. May end up needing insulin in the future and this was discussed and he is agreeable to seeing the dietitian now   PLAN:    He will get a battery replacement for his meter  Discussed when and how often to check his blood sugar and goals for blood sugars  He needs to reduce portions of his regular soft drinks  Referral made for dietitian to see him  Again emphasized need for weight loss  Since he tends to be variably active will not increase his Amaryl as he may potentially get hypoglycemia    There are no Patient Instructions on file for this visit.  Elayne Snare 02/04/2018, 9:23 AM   Note: This office note was prepared with Dragon voice recognition  system technology. Any transcriptional errors that result from this process are unintentional.

## 2018-02-10 ENCOUNTER — Other Ambulatory Visit: Payer: Self-pay | Admitting: Nurse Practitioner

## 2018-02-10 MED FILL — GABAPENTIN 600 MG TABS: 600 | 30 days supply | Qty: 90 | Fill #5

## 2018-02-10 MED FILL — metFORMIN HCL 1000 MG TABS: 1000 | 30 days supply | Qty: 60 | Fill #3

## 2018-02-10 MED FILL — CARVEDILOL 12.5 MG TABLET: 12.5 | 30 days supply | Qty: 60 | Fill #6

## 2018-03-02 MED FILL — levETIRAcetam 750 MG TABS: 750 | 30 days supply | Qty: 60 | Fill #1

## 2018-03-02 MED FILL — HUMIRA PEN 40 MG/0.8ML PNKT: 40 | 28 days supply | Qty: 2 | Fill #4

## 2018-03-02 MED FILL — clonazePAM 0.5 MG TABS: 0.5 | 30 days supply | Qty: 30 | Fill #5

## 2018-03-02 MED FILL — ATORVASTATIN 10 MG TABLET: 10 | 90 days supply | Qty: 90 | Fill #2

## 2018-03-02 MED FILL — GLIMEPIRIDE 1 MG TABLET: 1 | 30 days supply | Qty: 30 | Fill #3

## 2018-03-02 MED FILL — TRULICITY 1.5 MG/0.5 ML PEN: 1.5 | 28 days supply | Qty: 2 | Fill #3

## 2018-03-17 ENCOUNTER — Ambulatory Visit: Payer: 59 | Admitting: Dietician

## 2018-03-17 MED FILL — metFORMIN HCL 1000 MG TABS: 1000 | 30 days supply | Qty: 60 | Fill #4

## 2018-03-17 MED FILL — OMEPRAZOLE 40 MG CPDR: 40 | 90 days supply | Qty: 180 | Fill #1

## 2018-03-21 ENCOUNTER — Ambulatory Visit: Payer: 59 | Admitting: Neurology

## 2018-03-22 ENCOUNTER — Ambulatory Visit: Payer: 59 | Admitting: Neurology

## 2018-03-23 ENCOUNTER — Ambulatory Visit: Payer: 59 | Admitting: Cardiology

## 2018-03-31 ENCOUNTER — Other Ambulatory Visit: Payer: Self-pay | Admitting: Internal Medicine

## 2018-03-31 ENCOUNTER — Other Ambulatory Visit: Payer: Self-pay | Admitting: Endocrinology

## 2018-03-31 MED FILL — HUMIRA PEN 40 MG/0.8ML PNKT: 40 | 28 days supply | Qty: 2 | Fill #5

## 2018-03-31 MED FILL — GABAPENTIN 600 MG TABS: 600 | 30 days supply | Qty: 90 | Fill #0

## 2018-03-31 MED FILL — GLIMEPIRIDE 1 MG TABLET: 1 | 30 days supply | Qty: 30 | Fill #0

## 2018-03-31 MED FILL — levETIRAcetam 750 MG TABS: 750 | 30 days supply | Qty: 60 | Fill #2

## 2018-04-12 ENCOUNTER — Other Ambulatory Visit: Payer: Self-pay | Admitting: Neurology

## 2018-04-12 NOTE — Telephone Encounter (Signed)
Registry checked. Last fill date is 03/02/18 for #30. Next OV is 07/06/18.

## 2018-04-13 MED FILL — clonazePAM 0.5 MG TABS: 0.5 | 30 days supply | Qty: 30 | Fill #0

## 2018-04-18 MED FILL — metFORMIN HCL 1000 MG TABS: 1000 | 30 days supply | Qty: 60 | Fill #5

## 2018-04-18 MED FILL — DULoxetine HCL 60 MG CPEP: 60 | 90 days supply | Qty: 180 | Fill #1

## 2018-04-24 IMAGING — RF DG CHOLANGIOGRAM OPERATIVE
1 series · 4 of 4 positions shown · non-contrast
Comparison: None.

CLINICAL DATA: Gallstones

EXAM:
INTRAOPERATIVE CHOLANGIOGRAM
TECHNIQUE: Cholangiographic images from the C-arm fluoroscopic device were
submitted for interpretation post-operatively. Please see the
procedural report for the amount of contrast and the fluoroscopy
time utilized.

[Series 1: run · 4 of 68 frames shown]
[frame 1/68]
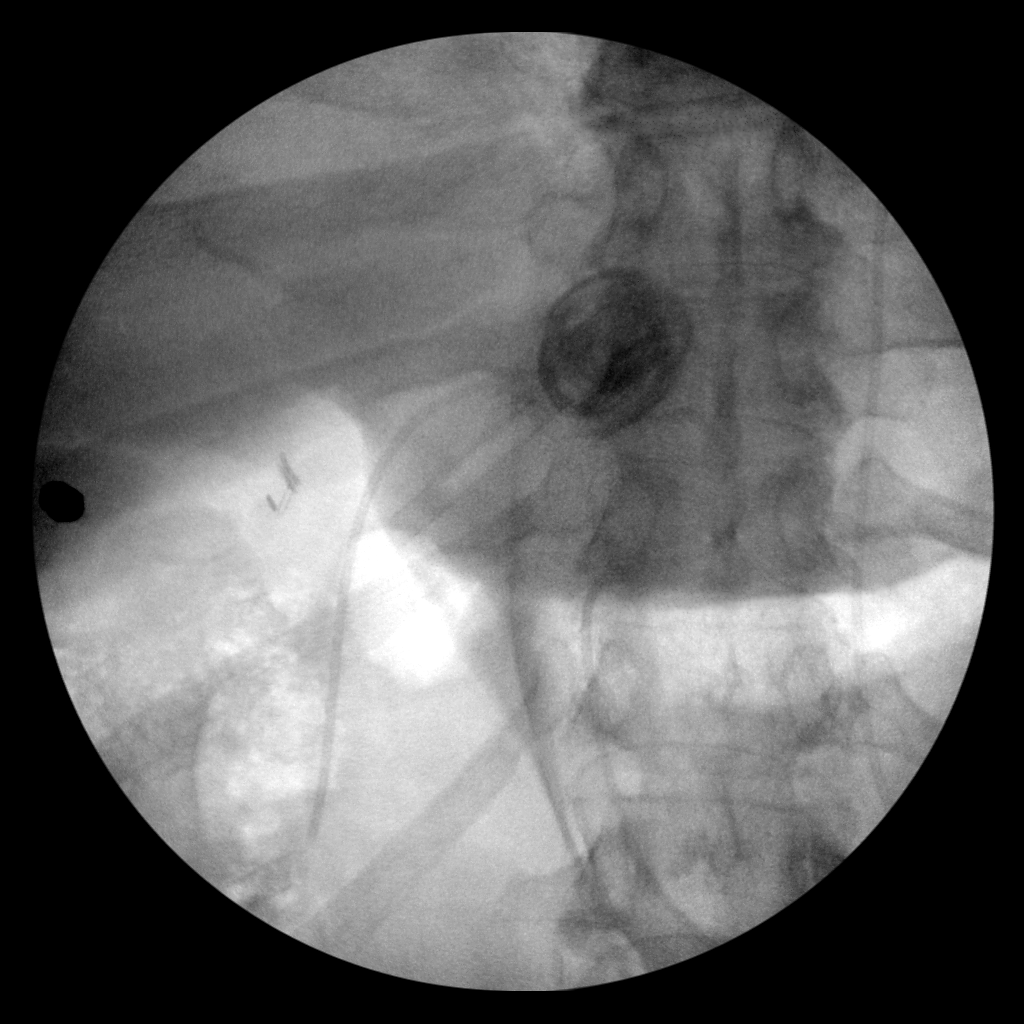
[frame 11/68]
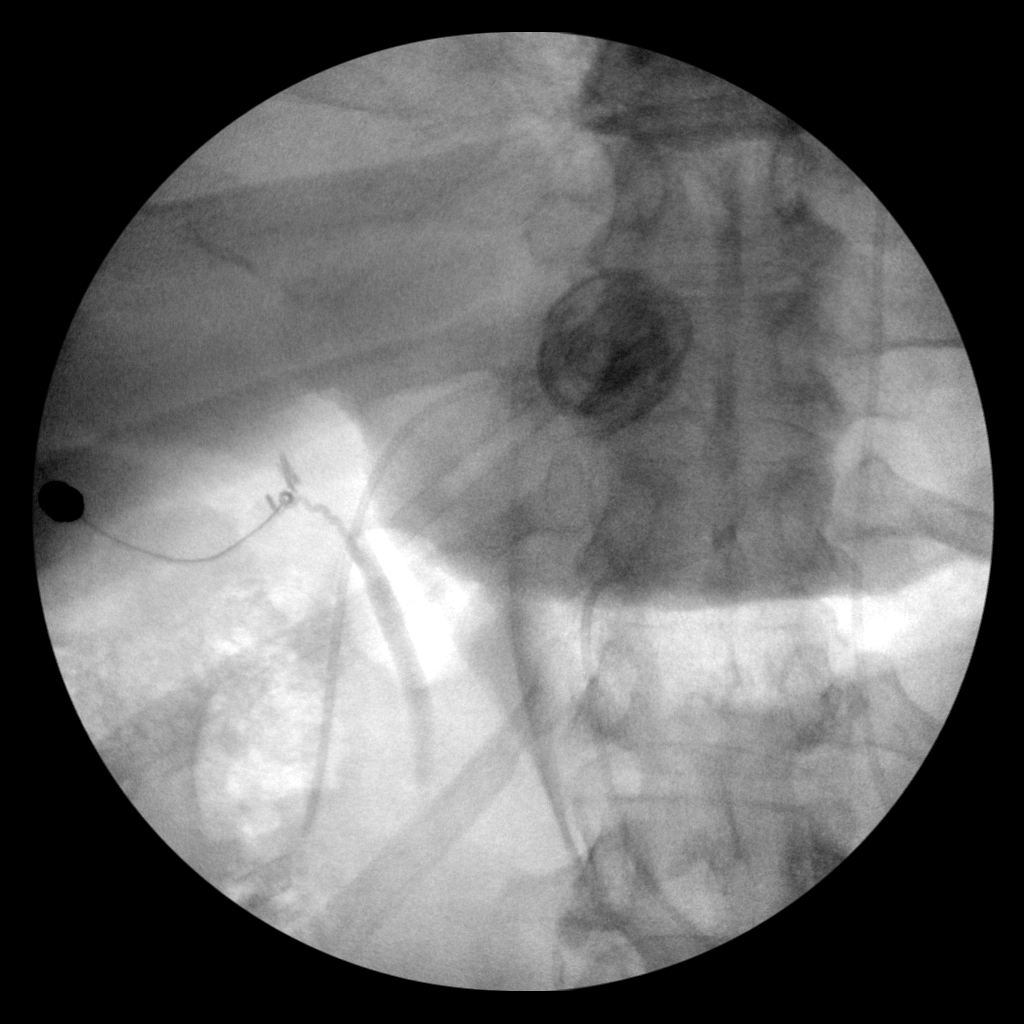
[frame 35/68]
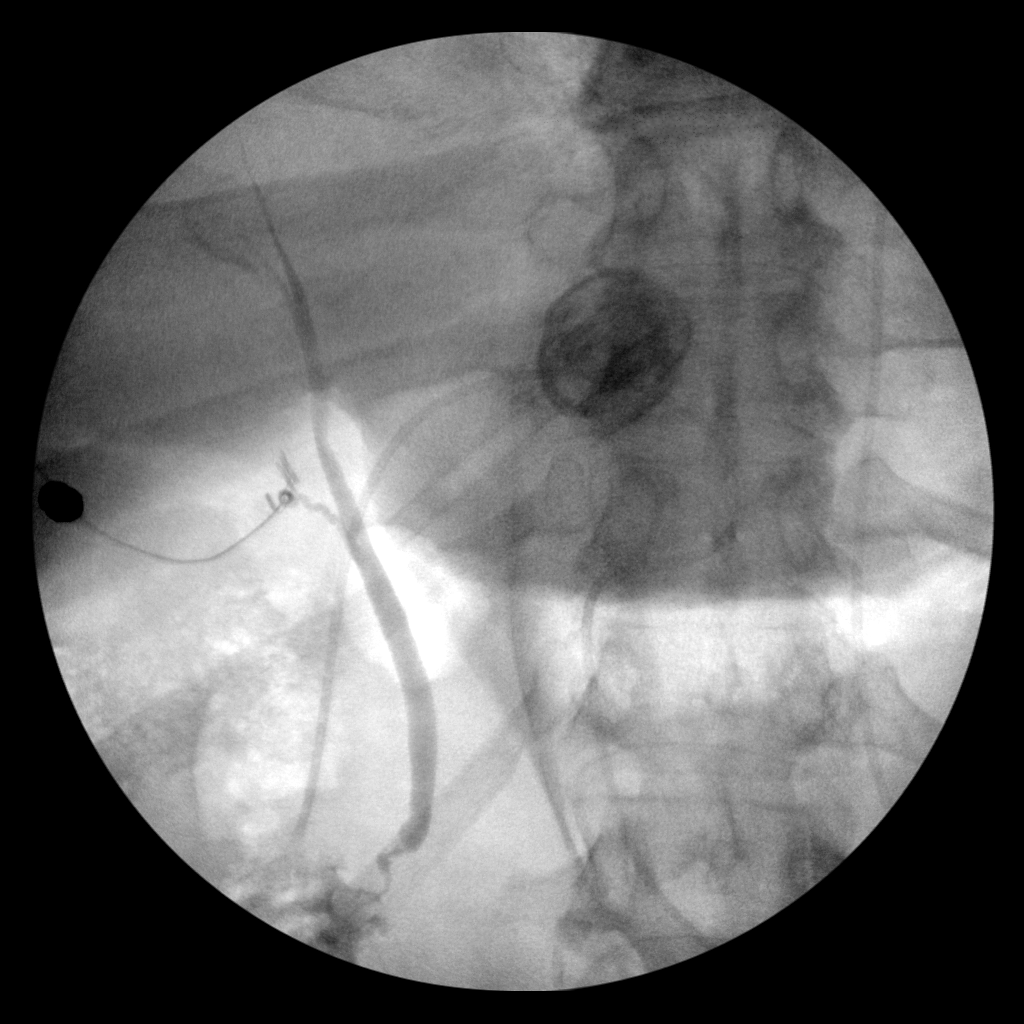
[frame 58/68]
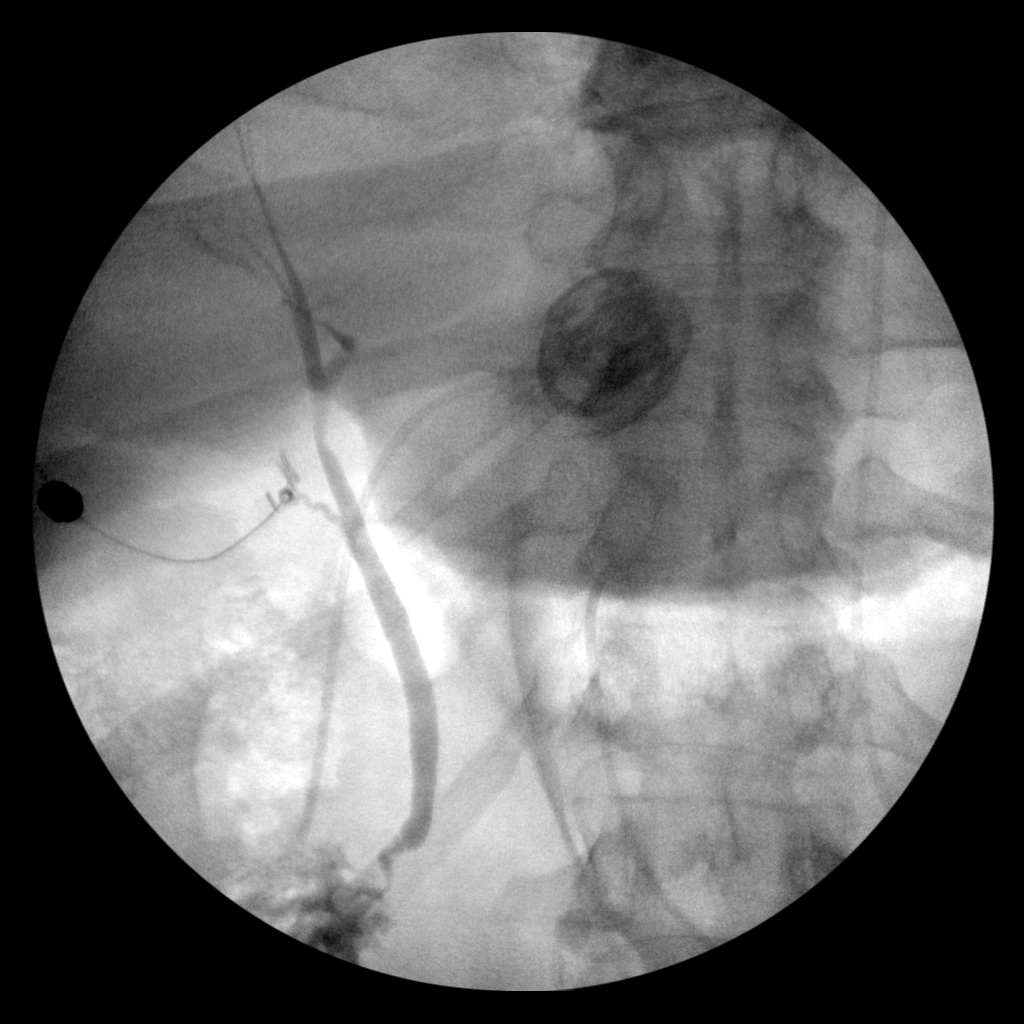

[4 of 4 positions shown; findings below may reference images not displayed]

FINDINGS: Contrast fills the biliary tree and duodenum without filling defects
in the common bile duct. There is web-like narrowing in the distal
common bile duct.
IMPRESSION: No common bile duct stones. There is web-like narrowing in the
distal common bile duct.

## 2018-05-03 ENCOUNTER — Other Ambulatory Visit (INDEPENDENT_AMBULATORY_CARE_PROVIDER_SITE_OTHER): Payer: 59

## 2018-05-03 DIAGNOSIS — E1165 Type 2 diabetes mellitus with hyperglycemia: Secondary | ICD-10-CM

## 2018-05-03 LAB — COMPREHENSIVE METABOLIC PANEL
ALBUMIN: 4.1 g/dL (ref 3.5–5.2)
ALT: 35 U/L (ref 0–53)
AST: 28 U/L (ref 0–37)
Alkaline Phosphatase: 89 U/L (ref 39–117)
BILIRUBIN TOTAL: 0.7 mg/dL (ref 0.2–1.2)
BUN: 23 mg/dL (ref 6–23)
CALCIUM: 9.4 mg/dL (ref 8.4–10.5)
CO2: 24 meq/L (ref 19–32)
CREATININE: 1.3 mg/dL (ref 0.40–1.50)
Chloride: 102 mEq/L (ref 96–112)
GFR: 59.81 mL/min — ABNORMAL LOW (ref >60.00)
Glucose, Bld: 258 mg/dL — ABNORMAL HIGH (ref 70–99)
Potassium: 4.8 mEq/L (ref 3.5–5.1)
Sodium: 136 mEq/L (ref 135–145)
Total Protein: 7.8 g/dL (ref 6.0–8.3)

## 2018-05-03 LAB — LIPID PANEL
CHOL/HDL RATIO: 4
CHOLESTEROL: 170 mg/dL (ref 0–200)
HDL: 43 mg/dL (ref >39.00)
NonHDL: 126.65
Triglycerides: 332 mg/dL — ABNORMAL HIGH (ref 0.0–149.0)
VLDL: 66.4 mg/dL — AB (ref 0.0–40.0)

## 2018-05-03 LAB — LDL CHOLESTEROL, DIRECT: Direct LDL: 89 mg/dL

## 2018-05-03 LAB — HEMOGLOBIN A1C: Hgb A1c MFr Bld: 6.8 % — ABNORMAL HIGH (ref 4.6–6.5)

## 2018-05-04 ENCOUNTER — Other Ambulatory Visit: Payer: Self-pay | Admitting: Endocrinology

## 2018-05-04 MED FILL — TRULICITY 1.5 MG/0.5 ML PEN: 1.5 | 28 days supply | Qty: 2 | Fill #0

## 2018-05-04 MED FILL — levETIRAcetam 750 MG TABS: 750 | 30 days supply | Qty: 60 | Fill #3

## 2018-05-04 MED FILL — GLIMEPIRIDE 1 MG TABLET: 1 | 30 days supply | Qty: 30 | Fill #1

## 2018-05-04 MED FILL — CARVEDILOL 12.5 MG TABLET: 12.5 | 30 days supply | Qty: 60 | Fill #7

## 2018-05-05 ENCOUNTER — Other Ambulatory Visit: Payer: Self-pay | Admitting: Pharmacist

## 2018-05-05 MED ORDER — ADALIMUMAB 40 MG/0.8ML ~~LOC~~ AJKT: 0.8000 mL | AUTO-INJECTOR | SUBCUTANEOUS | 1 refills | Status: DC

## 2018-05-05 MED FILL — HUMIRA PEN 40 MG/0.8ML PNKT: 40 | 28 days supply | Qty: 2 | Fill #0

## 2018-05-09 ENCOUNTER — Ambulatory Visit: Payer: 59 | Admitting: Endocrinology

## 2018-05-23 ENCOUNTER — Ambulatory Visit: Payer: 59 | Admitting: Endocrinology

## 2018-05-23 ENCOUNTER — Other Ambulatory Visit: Payer: Self-pay | Admitting: Internal Medicine

## 2018-05-23 MED FILL — GABAPENTIN 600 MG TABS: 600 | 30 days supply | Qty: 90 | Fill #1

## 2018-05-23 MED FILL — clonazePAM 0.5 MG TABS: 0.5 | 30 days supply | Qty: 30 | Fill #1

## 2018-05-23 MED FILL — metFORMIN HCL 1000 MG TABS: 1000 | 30 days supply | Qty: 60 | Fill #6

## 2018-05-27 MED FILL — HUMIRA PEN 40 MG/0.8ML PNKT: 40 | 28 days supply | Qty: 2 | Fill #1

## 2018-06-01 MED FILL — GLIMEPIRIDE 1 MG TABLET: 1 | 30 days supply | Qty: 30 | Fill #2

## 2018-06-01 MED FILL — levETIRAcetam 750 MG TABS: 750 | 30 days supply | Qty: 60 | Fill #4

## 2018-06-10 ENCOUNTER — Encounter: Payer: Self-pay | Admitting: Internal Medicine

## 2018-06-10 ENCOUNTER — Ambulatory Visit (INDEPENDENT_AMBULATORY_CARE_PROVIDER_SITE_OTHER): Payer: 59 | Admitting: Internal Medicine

## 2018-06-10 VITALS — BP 132/80 | HR 85 | Temp 98.0°F | Resp 16 | Ht 71.0 in | Wt 263.1 lb

## 2018-06-10 DIAGNOSIS — R5383 Other fatigue: Secondary | ICD-10-CM | POA: Diagnosis not present

## 2018-06-10 DIAGNOSIS — N182 Chronic kidney disease, stage 2 (mild): Secondary | ICD-10-CM

## 2018-06-10 DIAGNOSIS — E1122 Type 2 diabetes mellitus with diabetic chronic kidney disease: Secondary | ICD-10-CM

## 2018-06-10 DIAGNOSIS — G4733 Obstructive sleep apnea (adult) (pediatric): Secondary | ICD-10-CM

## 2018-06-10 LAB — CBC WITH DIFFERENTIAL/PLATELET
BASOS ABS: 0 10*3/uL (ref 0.0–0.1)
Basophils Relative: 0.5 % (ref 0.0–3.0)
EOS PCT: 3.4 % (ref 0.0–5.0)
Eosinophils Absolute: 0.3 10*3/uL (ref 0.0–0.7)
HEMATOCRIT: 39.3 % (ref 39.0–52.0)
Hemoglobin: 12.8 g/dL — ABNORMAL LOW (ref 13.0–17.0)
LYMPHS PCT: 31.5 % (ref 12.0–46.0)
Lymphs Abs: 2.4 10*3/uL (ref 0.7–4.0)
MCHC: 32.7 g/dL (ref 30.0–36.0)
MCV: 84.2 fl (ref 78.0–100.0)
MONOS PCT: 7.2 % (ref 3.0–12.0)
Monocytes Absolute: 0.6 10*3/uL (ref 0.1–1.0)
Neutro Abs: 4.4 10*3/uL (ref 1.4–7.7)
Neutrophils Relative %: 57.4 % (ref 43.0–77.0)
Platelets: 167 10*3/uL (ref 150.0–400.0)
RBC: 4.67 Mil/uL (ref 4.22–5.81)
RDW: 16.7 % — ABNORMAL HIGH (ref 11.5–15.5)
WBC: 7.7 10*3/uL (ref 4.0–10.5)

## 2018-06-10 LAB — B12 AND FOLATE PANEL
FOLATE: 14.2 ng/mL (ref >5.9)
Vitamin B-12: 490 pg/mL (ref 211–911)

## 2018-06-10 LAB — TSH: TSH: 2.34 u[IU]/mL (ref 0.35–4.50)

## 2018-06-10 MED ORDER — VALACYCLOVIR HCL 1 G PO TABS: 1000.0000 mg | ORAL_TABLET | Freq: Three times a day (TID) | ORAL | 0 refills | Status: DC

## 2018-06-10 MED FILL — valACYclovir HCL 1 GM TABS: 1 | 7 days supply | Qty: 21 | Fill #0

## 2018-06-10 MED FILL — ATORVASTATIN 10 MG TABLET: 10 | 90 days supply | Qty: 90 | Fill #3

## 2018-06-10 NOTE — Progress Notes (Signed)
Pre visit review using our clinic review tool, if applicable. No additional management support is needed unless otherwise documented below in the visit note. 

## 2018-06-10 NOTE — Patient Instructions (Signed)
GO TO THE LAB : Get the blood work    GO TO THE FRONT DESK Schedule your next appointment for a  Check up in 3 months  Start Valtrex for shingles ASAP. Tylenol as needed for pain If the pain is not improving let me know   Is very important that you try to use your CPAP again. Take clonazepam at bedtime to help you sleep If you need help fitting the mask let us know or call your CPAP provider.

## 2018-06-10 NOTE — Progress Notes (Signed)
Subjective:    Patient ID: James [name], male    DOB: May 09, 1956, 62 y.o.   MRN: [id number]  DOS:  06/10/2018 Type of visit - description : acute Developed a rash 3 days ago at the left back.  The rash is painful and itchy.  The area surrounding the rash -specifically at the left lateral chest- is also painful. Also, 1 to 2 months history of feeling very fatigued and no energy.  Review of Systems Denies fever chills. No weight frequency. No actual chest pain, DOE or lower extremity edema  Past Medical History:  Diagnosis Date  . Acid reflux   . Arthritis   . Chronic headaches    on cymbalta  . Cirrhosis ([place])   . Colon polyps   . Complication of anesthesia    problems waking up from anesthesia  . Depression    on cymbalta  . Diabetes mellitus with neuropathy ([place])    borderline per primary MD  . Diverticulitis 03/2013  . Eczema   . Elevated LFTs   . Fatty liver   . History of kidney stones   . Hypertension   . Insomnia 04/26/2013  . Kidney stone   . Morbid obesity ([place])   . Neuromuscular disorder (HCC)    neuropathy  . Neuropathy   . Post-traumatic hydrocephalus ([facility])    s/p shunts x 2 (first got infected )  . Psoriasis    sees Dr [name]  . Psoriatic arthritis ([place])   . 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 Sleep apnea    no CPAP     . Stomach ulcer   . Testosterone deficiency 04/28/2011    Past Surgical History:  Procedure Laterality Date  . BACK SURGERY  1980  . CHOLECYSTECTOMY  08/25/2017   laproscopic   . CHOLECYSTECTOMY N/A 08/25/2017   Procedure: LAPAROSCOPIC CHOLECYSTECTOMY WITH INTRAOPERATIVE CHOLANGIOGRAM;  Surgeon: [name], MD;  Location: [facility];  Service: General;  Laterality: N/A;  . SHOULDER SURGERY Left 2010  . TOE SURGERY Left 2018  . TOTAL HIP ARTHROPLASTY Left 2011  . VENTRICULOPERITONEAL SHUNT  2007   x2    Social History   Socioeconomic History  . Marital status: Married    Spouse  name: [name]  . Number of children: 2  . Years of education: Not on file  . Highest education level: Not on file  Occupational History  . Occupation: disable   Social Needs  . Financial resource strain: Not on file  . Food insecurity:    Worry: Not on file    Inability: Not on file  . Transportation needs:    Medical: Not on file    Non-medical: Not on file  Tobacco Use  . Smoking status: Never Smoker  . Smokeless tobacco: Never Used  Substance and Sexual Activity  . Alcohol use: No  . Drug use: No  . Sexual activity: Yes    Partners: Female  Lifestyle  . Physical activity:    Days per week: Not on file    Minutes per session: Not on file  . Stress: Not on file  Relationships  . Social connections:    Talks on phone: Not on file    Gets together: Not on file    Attends religious service: Not on file    Active member of club or organization: Not on file    Attends meetings of clubs or organizations: Not on file    Relationship  status: Not on file  . Intimate partner violence:    Fear of current or ex partner: Not on file    Emotionally abused: Not on file    Physically abused: Not on file    Forced sexual activity: Not on file  Other Topics Concern  . 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      Allergies as of 06/10/2018      Reactions   Bee Venom Swelling   Hydrocodone-homatropine Other (See Comments)   Depressed feeling   Morphine And Related Other (See Comments)   Hallucinations, back in the 80s. States has taken vicodin before w/o problems    Sulfa Drugs Cross Reactors Rash      Medication List       Accurate as of June 10, 2018  5:19 PM. Always use your most recent med list.        acetaminophen 500 MG tablet Commonly known as:  TYLENOL Take 1,000 mg by mouth every 6 (six) hours  as needed for mild pain or headache.   Adalimumab 40 MG/0.8ML Pnkt Commonly known as:  HUMIRA PEN Inject 0.8 mLs into the skin every 14 (fourteen) days. Inject 40 mg (0.8 ml) under the skin every other week.   atorvastatin 10 MG tablet Commonly known as:  LIPITOR TAKE 1 TABLET (10 MG TOTAL) BY MOUTH DAILY.   carvedilol 12.5 MG tablet Commonly known as:  COREG Take 1 tablet (12.5 mg total) by mouth 2 (two) times daily with a meal.   clonazePAM 0.5 MG tablet Commonly known as:  KLONOPIN TAKE 1 TABLET BY MOUTH AT BEDTIME   DULoxetine 60 MG capsule Commonly known as:  CYMBALTA Take 1 capsule (60 mg total) by mouth 2 (two) times daily.   EPINEPHrine 0.3 mg/0.3 mL Soaj injection Commonly known as:  EPIPEN 2-PAK Inject 0.3 mLs (0.3 mg total) into the muscle once as needed for up to 1 dose.   fenofibrate micronized 134 MG capsule Commonly known as:  LOFIBRA TAKE 1 CAPSULE (134 MG TOTAL) BY MOUTH DAILY BEFORE BREAKFAST.   FREESTYLE FREEDOM LITE w/Device Kit Use to check blood sugars 3 times daily. Dx code: E11.9   freestyle lancets Use to check blood sugar 3 times daily. Dx code E11.9   gabapentin 600 MG tablet Commonly known as:  NEURONTIN Take 1 tablet (600 mg total) by mouth 3 (three) times daily.   glimepiride 1 MG tablet Commonly known as:  AMARYL TAKE 1 TABLET (1 MG TOTAL) BY MOUTH DAILY BEFORE SUPPER.   glucose blood test strip Commonly known as:  ACCU-CHEK GUIDE Use to test blood sugar three times daily   levETIRAcetam 750 MG tablet Commonly known as:  KEPPRA Take 1 tablet (750 mg total) by mouth 2 (two) times daily.   metFORMIN 1000 MG tablet Commonly known as:  GLUCOPHAGE TAKE 1 TABLET (1000 Mg) BY MOUTH TWICE DAILY WITH A MEAL   omeprazole 40 MG capsule Commonly known as:  PRILOSEC Take 1 capsule (40 mg total) by mouth 2 (two) times daily before a meal.   TRULICITY 1.5 OM/3.5DH Sopn Generic drug:  Dulaglutide Inject 1.5 mg into the skin once a week.     valACYclovir 1000 MG tablet Commonly known as:  VALTREX Take 1 tablet (1,000 mg total) by mouth 3 (three) times daily.  Objective:   Physical Exam Skin:        BP 132/80 (BP Location: Left Arm, Patient Position: Sitting, Cuff Size: Normal)   Pulse 85   Temp 98 F (36.7 C) (Oral)   Resp 16   Ht _0  (1.803 m)   Wt 263 lb 2 oz (119.4 kg)   SpO2 96%   BMI 36.70 kg/m  General:   Well developed, NAD, BMI noted. HEENT:  Normocephalic . Face symmetric, atraumatic Neck: No JVD Lungs:  CTA B Normal respiratory effort, no intercostal retractions, no accessory muscle use. Heart: RRR,  no murmur.  No pretibial edema bilaterally  Neurologic:  alert & oriented X3.  Speech normal, gait appropriate for age and unassisted Psych--  Cognition and judgment appear intact.  Cooperative with normal attention span and concentration.  Behavior appropriate. No anxious or depressed appearing.        Assessment & Plan:     Assessment  DM - Dr [name] Neuropathy (x years, rx gaba 05-2014, w/u 11-2014  RPR neg, vit D-B12-Folic Acid wnl ); Saw Dr Posey Pronto, [place]. The electrophysiologic findings are most consistent with a chronic, distal and symmetric sensorimotor polyneuropathy, predominantly axon loss in type, affecting the lower extremities. 2. There is no evidence of a superimposed lumbosacral radiculopathy. CRI HTN Hyperlipidemia (TG in the 500s 2016) OSA , dx 2012, sleep study again 02-2015 Dr Dohmeier--> severe OSA, intolerant to CPAP Depression, insomnia ----on Cymbalta NEURO: --Chronic headaches -----on Cymbalta --Posttraumatic hydrocephalus s/p 2 shunts (first got infected) MSK: on disability d/t back pain- HAs Hypogonadism  Dx 2012, normal T 11-2014 (on no RX) GI:  --GERD, diverticulitis 2014, h/o PUD -- NASH with cirrhosis per GI note 10/2017, s/p Hep A/B shots Psoriasis, psoriatic arthritis -- on HUMIRA  H/u urolithiasis +FH CAD brother MI age  60  PLAN Shingles: Symptoms and findings consistent with shingles, although is a little late, will start Valtrex.  Pain management with Tylenol, if pain persists he will let me know. Fatigue: Chart is reviewed, most noticeable he carries a diagnosis of severe sleep apnea, has been intolerant to the CPAP.  Although fatigue is probably multifactorial, OSA is likely the main driver. Labs reviewed, nothing to account for his symptoms. For completeness we will get a CBC, TSH, F63, folic acid and vitamin D. Strongly recommend to try to use his CPAP again, use clonazepam to help with sleep, call his CPAP provider or me if he is unable to use it. Declined need referral to neurology for now. OSA: see above RTC 3 months

## 2018-06-10 NOTE — Assessment & Plan Note (Signed)
Shingles: Symptoms and findings consistent with shingles, although is a little late, will start Valtrex.  Pain management with Tylenol, if pain persists he will let me know. Fatigue: Chart is reviewed, most noticeable he carries a diagnosis of severe sleep apnea, has been intolerant to the CPAP.  Although fatigue is probably multifactorial, OSA is likely the main driver. Labs reviewed, nothing to account for his symptoms. For completeness we will get a CBC, TSH, Y23, folic acid and vitamin D. Strongly recommend to try to use his CPAP again, use clonazepam to help with sleep, call his CPAP provider or me if he is unable to use it. Declined need referral to neurology for now. OSA: see above RTC 3 months

## 2018-06-13 ENCOUNTER — Ambulatory Visit: Payer: 59 | Admitting: Endocrinology

## 2018-06-14 LAB — VITAMIN D 1,25 DIHYDROXY
Vitamin D 1, 25 (OH)2 Total: 19 pg/mL (ref 18–72)
Vitamin D2 1, 25 (OH)2: <8 pg/mL
Vitamin D3 1, 25 (OH)2: 19 pg/mL

## 2018-06-15 ENCOUNTER — Encounter: Payer: Self-pay | Admitting: Endocrinology

## 2018-06-15 ENCOUNTER — Ambulatory Visit (INDEPENDENT_AMBULATORY_CARE_PROVIDER_SITE_OTHER): Payer: 59 | Admitting: Endocrinology

## 2018-06-15 VITALS — BP 124/78 | HR 83 | Ht 71.0 in | Wt 264.0 lb

## 2018-06-15 DIAGNOSIS — E1165 Type 2 diabetes mellitus with hyperglycemia: Secondary | ICD-10-CM | POA: Diagnosis not present

## 2018-06-15 NOTE — Progress Notes (Signed)
Patient ID: DRAGON THRUSH, male   DOB: 05-21-1956, 62 y.o.   MRN: [id number]           Reason for Appointment: Follow-up for Type 2 Diabetes  Referring physician: [name]  History of Present Illness:          Date of diagnosis of type 2 diabetes mellitus:  ?  2014      Background history:  He is not clear how his diabetes was diagnosed, likely on routine testing. Initially had been treated with metformin and also tried on Amaryl Patient had progressive increase in his blood sugars since 1/17 with stopping metformin and being on the regimen of Amaryl and Januvia, he thinks his blood sugars went up to 601.  He was then started on basal bolus insulin   Recent history:   Non-insulin hypoglycemic drugs the patient is taking are: Trulicity 1.5 mg weekly, Metformin 1 g twice a day, Amaryl 4m  His A1c is somewhat higher again at 6.8  Current management, blood sugar patterns and problems identified:  He did not bring his monitor for download as usual  He says he checks his blood sugar only when he does not feel good  His lab glucose was 258 last month possibly with drinking regular soft drink  He is still not motivated to exercise or do walking  Weight is recently going up and this is despite saying that he is taking his Trulicity regularly  No hypoglycemia with Amaryl  Side  effects from medications have been: None  Compliance with the medical regimen: Fair  Glucose monitoring:  done <1 times a day         Glucometer:  Accu-Chek Blood Glucose readings reportedly around 100  Self-care:   Meal times are:  Breakfast is not always eaten.  Lunch: 2-3 PM Dinner: 5-6 PM    Dietician visit, most recent:09/2015               Exercise:none  Weight history:  Wt Readings from Last 3 Encounters:  06/15/18 264 lb (119.7 kg)  06/10/18 263 lb 2 oz (119.4 kg)  02/04/18 258 lb 3.2 oz (117.1 kg)    Glycemic control:   Lab Results  Component Value Date   HGBA1C 6.8 (H) 05/03/2018   HGBA1C 6.6 (H) 01/28/2018   HGBA1C 6.0 11/01/2017   Lab Results  Component Value Date   MICROALBUR 1.9 07/15/2017   LDLCALC 137 (H) 09/07/2016   CREATININE 1.30 05/03/2018    Office Visit on 06/10/2018  Component Date Value Ref Range Status  . WBC 06/10/2018 7.7  4.0 - 10.5 K/uL Final  . RBC 06/10/2018 4.67  4.22 - 5.81 Mil/uL Final  . Hemoglobin 06/10/2018 12.8* 13.0 - 17.0 g/dL Final  . HCT 06/10/2018 39.3  39.0 - 52.0 % Final  . MCV 06/10/2018 84.2  78.0 - 100.0 fl Final  . MCHC 06/10/2018 32.7  30.0 - 36.0 g/dL Final  . RDW 06/10/2018 16.7* 11.5 - 15.5 % Final  . Platelets 06/10/2018 167.0  150.0 - 400.0 K/uL Final  . Neutrophils Relative % 06/10/2018 57.4  43.0 - 77.0 % Final  . Lymphocytes Relative 06/10/2018 31.5  12.0 - 46.0 % Final  . Monocytes Relative 06/10/2018 7.2  3.0 - 12.0 % Final  . Eosinophils Relative 06/10/2018 3.4  0.0 - 5.0 % Final  . Basophils Relative 06/10/2018 0.5  0.0 - 3.0 % Final  . Neutro Abs 06/10/2018 4.4  1.4 - 7.7 K/uL Final  . Lymphs  Abs 06/10/2018 2.4  0.7 - 4.0 K/uL Final  . Monocytes Absolute 06/10/2018 0.6  0.1 - 1.0 K/uL Final  . Eosinophils Absolute 06/10/2018 0.3  0.0 - 0.7 K/uL Final  . Basophils Absolute 06/10/2018 0.0  0.0 - 0.1 K/uL Final  . TSH 06/10/2018 2.34  0.35 - 4.50 uIU/mL Final  . Vitamin D 1, 25 (OH)2 Total 06/10/2018 19  18 - 72 pg/mL Final  . Vitamin D3 1, 25 (OH)2 06/10/2018 19  pg/mL Final  . Vitamin D2 1, 25 (OH)2 06/10/2018 <8  pg/mL Final   Comment: Marland Kitchen Vitamin D3, 1,25(OH)2 indicates both endogenous production and supplementation. Vitamin D2, 1,25(OH)2 is an indicator of exogenous sources, such as diet or supplementation.  Interpretation and therapy are based on measurement of Vitamin D,1,25(OH)2, Total. . . This test was developed and its analytical performance characteristics have been determined by [facility], [place], [place]. It has not been cleared or approved by the FDA. This assay  has been validated pursuant to the CLIA regulations and is used for clinical purposes. .   . Vitamin B-12 06/10/2018 490  211 - 911 pg/mL Final  . Folate 06/10/2018 14.2  >5.9 ng/mL Final       Allergies as of 06/15/2018      Reactions   Bee Venom Swelling   Hydrocodone-homatropine Other (See Comments)   Depressed feeling   Morphine And Related Other (See Comments)   Hallucinations, back in the 80s. States has taken vicodin before w/o problems    Sulfa Drugs Cross Reactors Rash      Medication List       Accurate as of June 15, 2018  2:53 PM. Always use your most recent med list.        acetaminophen 500 MG tablet Commonly known as:  TYLENOL Take 1,000 mg by mouth every 6 (six) hours as needed for mild pain or headache.   Adalimumab 40 MG/0.8ML Pnkt Commonly known as:  HUMIRA PEN Inject 0.8 mLs into the skin every 14 (fourteen) days. Inject 40 mg (0.8 ml) under the skin every other week.   atorvastatin 10 MG tablet Commonly known as:  LIPITOR TAKE 1 TABLET (10 MG TOTAL) BY MOUTH DAILY.   carvedilol 12.5 MG tablet Commonly known as:  COREG Take 1 tablet (12.5 mg total) by mouth 2 (two) times daily with a meal.   clonazePAM 0.5 MG tablet Commonly known as:  KLONOPIN TAKE 1 TABLET BY MOUTH AT BEDTIME   DULoxetine 60 MG capsule Commonly known as:  CYMBALTA Take 1 capsule (60 mg total) by mouth 2 (two) times daily.   EPINEPHrine 0.3 mg/0.3 mL Soaj injection Commonly known as:  EPIPEN 2-PAK Inject 0.3 mLs (0.3 mg total) into the muscle once as needed for up to 1 dose.   fenofibrate micronized 134 MG capsule Commonly known as:  LOFIBRA TAKE 1 CAPSULE (134 MG TOTAL) BY MOUTH DAILY BEFORE BREAKFAST.   FREESTYLE FREEDOM LITE w/Device Kit Use to check blood sugars 3 times daily. Dx code: E11.9   freestyle lancets Use to check blood sugar 3 times daily. Dx code E11.9   gabapentin 600 MG tablet Commonly known as:  NEURONTIN Take 1 tablet (600 mg total) by  mouth 3 (three) times daily.   glimepiride 1 MG tablet Commonly known as:  AMARYL TAKE 1 TABLET (1 MG TOTAL) BY MOUTH DAILY BEFORE SUPPER.   glucose blood test strip Commonly known as:  ACCU-CHEK GUIDE Use to test blood sugar three times  daily   levETIRAcetam 750 MG tablet Commonly known as:  KEPPRA Take 1 tablet (750 mg total) by mouth 2 (two) times daily.   metFORMIN 1000 MG tablet Commonly known as:  GLUCOPHAGE TAKE 1 TABLET (1000 Mg) BY MOUTH TWICE DAILY WITH A MEAL   omeprazole 40 MG capsule Commonly known as:  PRILOSEC Take 1 capsule (40 mg total) by mouth 2 (two) times daily before a meal.   TRULICITY 1.5 ZO/1.0RU Sopn Generic drug:  Dulaglutide Inject 1.5 mg into the skin once a week.   valACYclovir 1000 MG tablet Commonly known as:  VALTREX Take 1 tablet (1,000 mg total) by mouth 3 (three) times daily.       Allergies:  Allergies  Allergen Reactions  . Bee Venom Swelling  . Hydrocodone-Homatropine Other (See Comments)    Depressed feeling  . Morphine And Related Other (See Comments)    Hallucinations, back in the 80s. States has taken vicodin before w/o problems   . Sulfa Drugs Cross Reactors Rash    Past Medical History:  Diagnosis Date  . Acid reflux   . Arthritis   . Chronic headaches    on cymbalta  . Cirrhosis ([place])   . Colon polyps   . Complication of anesthesia    problems waking up from anesthesia  . Depression    on cymbalta  . Diabetes mellitus with neuropathy ([place])    borderline per primary MD  . Diverticulitis 03/2013  . Eczema   . Elevated LFTs   . Fatty liver   . History of kidney stones   . Hypertension   . Insomnia 04/26/2013  . Kidney stone   . Morbid obesity ([place])   . Neuromuscular disorder (HCC)    neuropathy  . Neuropathy   . Post-traumatic hydrocephalus (HCC)    s/p shunts x 2 (first got infected )  . Psoriasis    sees Dr [name]  . Psoriatic arthritis ([place])   . Scapholunate advanced collapse of left wrist  04/2015   see's Dr.Ortmann  . Sleep apnea    no CPAP     . Stomach ulcer   . Testosterone deficiency 04/28/2011    Past Surgical History:  Procedure Laterality Date  . BACK SURGERY  1980  . CHOLECYSTECTOMY  08/25/2017   laproscopic   . CHOLECYSTECTOMY N/A 08/25/2017   Procedure: LAPAROSCOPIC CHOLECYSTECTOMY WITH INTRAOPERATIVE CHOLANGIOGRAM;  Surgeon: [name], MD;  Location: [facility];  Service: General;  Laterality: N/A;  . SHOULDER SURGERY Left 2010  . TOE SURGERY Left 2018  . TOTAL HIP ARTHROPLASTY Left 2011  . VENTRICULOPERITONEAL SHUNT  2007   x2    Family History  Problem Relation Age of Onset  . Healthy Mother   . Lung cancer Father        alive, former smoker   . Heart disease Brother        MI age 34  . Other Brother        Murdered  . Down syndrome Son   . Diabetes Neg Hx   . Prostate cancer Neg Hx   . Colon cancer Neg Hx   . Stomach cancer Neg Hx     Social History:  reports that he has never smoked. He has never used smokeless tobacco. He reports that he does not drink alcohol or use drugs.    Review of Systems     HYPERTENSION: Blood pressure  is controlled Followed by PCP   BP Readings from Last 3 Encounters:  06/15/18 124/78  06/10/18 132/80  02/04/18 124/82    Lipid history: He is on Lipitor 10 mg with control of LDL and is followed by PCP Is on fenofibrate for triglyceride treatment, has increased triglycerides usually     Lab Results  Component Value Date   CHOL 170 05/03/2018   HDL 43.00 05/03/2018   LDLCALC 137 (H) 09/07/2016   LDLDIRECT 89.0 05/03/2018   TRIG 332.0 (H) 05/03/2018   CHOLHDL 4 05/03/2018            Most recent foot exam: 08/2017  He has symptomatic neuropathy and is taking gabapentin twice a day and  Cymbalta Has been followed by neurologist Also is having difficulties with balance   Review of Systems    Physical Examination:  BP 124/78 (BP Location: Left Arm, Patient Position: Sitting, Cuff Size:  Large)   Pulse 83   Ht 5' 11"  (1.803 m)   Wt 264 lb (119.7 kg)   SpO2 92%   BMI 36.82 kg/m       ASSESSMENT:  Diabetes type 2 with obesity  See history of present illness for discussion of current diabetes management, blood sugar patterns and problems identified  Currently on a regimen of metformin, Trulicity and low-dose Amaryl  A1c 6.8 although he has had periodically high readings over 200  Checking blood sugar very randomly and usually does not bring his meter for download He has been recommended seeing the dietitian to improve his diet, lose weight and cut down regular soft drinks but he has not done so    PLAN:    Consider Ozempic next year if covered by his insurance  Consistent monitoring of blood sugars routinely even after meals.  Needs to bring his meter for download each visit  Reduce amounts of regular soft drinks  Start walking regularly  Referral made for dietitian and this will be scheduled  Follow-up in 3 months    Patient Instructions  Check blood sugars on waking up 2 days a week  Also check blood sugars about 2 hours after meals and do this after different meals by rotation  Recommended blood sugar levels on waking up are 90-130 and about 2 hours after meal is 130-160  Please bring your blood sugar monitor to each visit, thank you  Walk daily      Elayne Snare 06/15/2018, 2:53 PM   Note: This office note was prepared with Dragon voice recognition system technology. Any transcriptional errors that result from this process are unintentional.

## 2018-06-15 NOTE — Patient Instructions (Addendum)
Check blood sugars on waking up 2 days a week  Also check blood sugars about 2 hours after meals and do this after different meals by rotation  Recommended blood sugar levels on waking up are 90-130 and about 2 hours after meal is 130-160  Please bring your blood sugar monitor to each visit, thank you  Walk daily

## 2018-06-16 ENCOUNTER — Ambulatory Visit: Payer: 59 | Admitting: Endocrinology

## 2018-06-21 ENCOUNTER — Other Ambulatory Visit: Payer: Self-pay | Admitting: Endocrinology

## 2018-06-21 MED FILL — metFORMIN HCL 1000 MG TABS: 1000 | 30 days supply | Qty: 60 | Fill #0

## 2018-06-21 MED FILL — clonazePAM 0.5 MG TABS: 0.5 | 30 days supply | Qty: 30 | Fill #2

## 2018-06-27 ENCOUNTER — Other Ambulatory Visit: Payer: Self-pay | Admitting: Internal Medicine

## 2018-06-27 ENCOUNTER — Other Ambulatory Visit: Payer: Self-pay | Admitting: Pharmacist

## 2018-06-27 MED ORDER — ADALIMUMAB 40 MG/0.8ML ~~LOC~~ AJKT: 0.8000 mL | AUTO-INJECTOR | SUBCUTANEOUS | 0 refills | Status: DC

## 2018-06-27 MED FILL — levETIRAcetam 750 MG TABS: 750 | 30 days supply | Qty: 60 | Fill #5

## 2018-06-27 MED FILL — GLIMEPIRIDE 1 MG TABLET: 1 | 30 days supply | Qty: 30 | Fill #3

## 2018-06-27 MED FILL — CARVEDILOL 12.5 MG TABLET: 12.5 | 90 days supply | Qty: 180 | Fill #0

## 2018-06-27 MED FILL — HUMIRA PEN 40 MG/0.8ML PNKT: 40 | 28 days supply | Qty: 2 | Fill #0

## 2018-06-27 MED FILL — GABAPENTIN 600 MG TABLET: 600 | 30 days supply | Qty: 90 | Fill #2

## 2018-07-06 ENCOUNTER — Encounter

## 2018-07-06 ENCOUNTER — Ambulatory Visit: Payer: 59 | Admitting: Neurology

## 2018-07-15 ENCOUNTER — Ambulatory Visit: Payer: 59 | Admitting: Dietician

## 2018-07-21 ENCOUNTER — Other Ambulatory Visit: Payer: Self-pay | Admitting: Endocrinology

## 2018-07-21 ENCOUNTER — Other Ambulatory Visit: Payer: Self-pay | Admitting: Internal Medicine

## 2018-07-21 ENCOUNTER — Other Ambulatory Visit: Payer: Self-pay | Admitting: Pharmacist

## 2018-07-21 MED ORDER — ADALIMUMAB 40 MG/0.8ML ~~LOC~~ AJKT: 0.8000 mL | AUTO-INJECTOR | SUBCUTANEOUS | 0 refills | Status: DC

## 2018-07-21 MED FILL — DULOXETINE HCL 60 MG CPEP: 60 | 30 days supply | Qty: 60 | Fill #0

## 2018-07-21 MED FILL — GLIMEPIRIDE 1 MG TABLET: 1 | 30 days supply | Qty: 30 | Fill #0

## 2018-07-21 MED FILL — TRULICITY 1.5 MG/0.5 ML PEN: 1.5 | 28 days supply | Qty: 2 | Fill #1

## 2018-07-21 MED FILL — metFORMIN HCL 1000 MG TABS: 1000 | 30 days supply | Qty: 60 | Fill #1

## 2018-07-21 MED FILL — clonazePAM 0.5 MG TABS: 0.5 | 30 days supply | Qty: 30 | Fill #3

## 2018-07-21 MED FILL — HUMIRA PEN 40 MG/0.8ML PNKT: 40 | 28 days supply | Qty: 2 | Fill #0

## 2018-08-01 ENCOUNTER — Telehealth: Payer: Self-pay

## 2018-08-01 ENCOUNTER — Ambulatory Visit: Payer: Self-pay | Admitting: *Deleted

## 2018-08-01 NOTE — Telephone Encounter (Signed)
Pt scheduled tomorrow for R "side" pain- described as burning for 2 days. PEC- please call Pt to triage. No hx of appendectomy.

## 2018-08-01 NOTE — Telephone Encounter (Signed)
PEC triaged. See telephone note.

## 2018-08-01 NOTE — Telephone Encounter (Signed)
Noted  

## 2018-08-01 NOTE — Telephone Encounter (Signed)
Damita Dunnings, CMA 1 hour ago (12:14 PM)      Pt scheduled tomorrow for R "side" pain- described as burning for 2 days. PEC- please call Pt to triage. No hx of appendectomy.       Documentation     Reason for Disposition . [1] MILD-MODERATE pain AND [2] constant AND [3] present > 2 hours    Call to office- patient describes mild pain- only when he moves. No other symptoms. Call to office- ok to wait until tomorrow- if he has any changes he will call back.  Answer Assessment - Initial Assessment Questions 1. LOCATION: "Where does it hurt?"      R side- skin surface and internal- side at "love handle" 2. RADIATION: "Does the pain shoot anywhere else?" (e.g., chest, back)     no 3. ONSET: "When did the pain begin?" (Minutes, hours or days ago)      yesterday 4. SUDDEN: "Gradual or sudden onset?"     sudden 5. PATTERN "Does the pain come and go, or is it constant?"    - If constant: "Is it getting better, staying the same, or worsening?"      (Note: Constant means the pain never goes away completely; most serious pain is constant and it progresses)     - If intermittent: "How long does it last?" "Do you have pain now?"     (Note: Intermittent means the pain goes away completely between bouts)     Comes and goes 6. SEVERITY: "How bad is the pain?"  (e.g., Scale 1-10; mild, moderate, or severe)    - MILD (1-3): doesn't interfere with normal activities, abdomen soft and not tender to touch     - MODERATE (4-7): interferes with normal activities or awakens from sleep, tender to touch     - SEVERE (8-10): excruciating pain, doubled over, unable to do any normal activities       Mild- as long as not moving 7. RECURRENT SYMPTOM: "Have you ever had this type of abdominal pain before?" If so, ask: "When was the last time?" and "What happened that time?"      no 8. CAUSE: "What do you think is causing the abdominal pain?"     unknown 9. RELIEVING/AGGRAVATING FACTORS: "What makes it better or  worse?" (e.g., movement, antacids, bowel movement)     Movement- brushing against things 10. OTHER SYMPTOMS: "Has there been any vomiting, diarrhea, constipation, or urine problems?"       no  Protocols used: ABDOMINAL PAIN - MALE-A-AH

## 2018-08-01 NOTE — Telephone Encounter (Signed)
FYI

## 2018-08-02 ENCOUNTER — Encounter: Payer: Self-pay | Admitting: Internal Medicine

## 2018-08-02 ENCOUNTER — Ambulatory Visit (INDEPENDENT_AMBULATORY_CARE_PROVIDER_SITE_OTHER): Payer: 59 | Admitting: Internal Medicine

## 2018-08-02 VITALS — BP 132/80 | HR 73 | Temp 98.3°F | Resp 16 | Ht 71.0 in | Wt 271.0 lb

## 2018-08-02 DIAGNOSIS — D649 Anemia, unspecified: Secondary | ICD-10-CM

## 2018-08-02 DIAGNOSIS — R109 Unspecified abdominal pain: Secondary | ICD-10-CM | POA: Diagnosis not present

## 2018-08-02 LAB — CBC WITH DIFFERENTIAL/PLATELET
Basophils Absolute: 0 10*3/uL (ref 0.0–0.1)
Basophils Relative: 0.4 % (ref 0.0–3.0)
Eosinophils Absolute: 0.2 10*3/uL (ref 0.0–0.7)
Eosinophils Relative: 4.2 % (ref 0.0–5.0)
HEMATOCRIT: 36.1 % — AB (ref 39.0–52.0)
Hemoglobin: 11.9 g/dL — ABNORMAL LOW (ref 13.0–17.0)
Lymphocytes Relative: 26.5 % (ref 12.0–46.0)
Lymphs Abs: 1.2 10*3/uL (ref 0.7–4.0)
MCHC: 32.9 g/dL (ref 30.0–36.0)
MCV: 84.1 fl (ref 78.0–100.0)
Monocytes Absolute: 0.4 10*3/uL (ref 0.1–1.0)
Monocytes Relative: 7.5 % (ref 3.0–12.0)
NEUTROS ABS: 2.9 10*3/uL (ref 1.4–7.7)
Neutrophils Relative %: 61.4 % (ref 43.0–77.0)
Platelets: 134 10*3/uL — ABNORMAL LOW (ref 150.0–400.0)
RBC: 4.29 Mil/uL (ref 4.22–5.81)
RDW: 18.6 % — ABNORMAL HIGH (ref 11.5–15.5)
WBC: 4.7 10*3/uL (ref 4.0–10.5)

## 2018-08-02 LAB — IRON: Iron: 63 ug/dL (ref 42–165)

## 2018-08-02 LAB — POC URINALSYSI DIPSTICK (AUTOMATED)
Bilirubin, UA: NEGATIVE
Blood, UA: NEGATIVE
Glucose, UA: POSITIVE — AB
KETONES UA: NEGATIVE
LEUKOCYTES UA: NEGATIVE
Nitrite, UA: NEGATIVE
Protein, UA: POSITIVE — AB
SPEC GRAV UA: 1.025 (ref 1.010–1.025)
Urobilinogen, UA: 0.2 E.U./dL
pH, UA: 6 (ref 5.0–8.0)

## 2018-08-02 LAB — URINALYSIS, ROUTINE W REFLEX MICROSCOPIC
Bilirubin Urine: NEGATIVE
Hgb urine dipstick: NEGATIVE
Ketones, ur: NEGATIVE
Leukocytes, UA: NEGATIVE
Nitrite: NEGATIVE
RBC / HPF: NONE SEEN (ref 0–?)
Specific Gravity, Urine: >=1.03 — AB (ref 1.000–1.030)
Total Protein, Urine: NEGATIVE
URINE GLUCOSE: 250 — AB
Urobilinogen, UA: 0.2 (ref 0.0–1.0)
pH: 5 (ref 5.0–8.0)

## 2018-08-02 LAB — BASIC METABOLIC PANEL
BUN: 17 mg/dL (ref 6–23)
CALCIUM: 9.4 mg/dL (ref 8.4–10.5)
CHLORIDE: 102 meq/L (ref 96–112)
CO2: 24 meq/L (ref 19–32)
Creatinine, Ser: 1.09 mg/dL (ref 0.40–1.50)
GFR: 68.91 mL/min (ref >60.00)
Glucose, Bld: 245 mg/dL — ABNORMAL HIGH (ref 70–99)
Potassium: 4.8 mEq/L (ref 3.5–5.1)
Sodium: 135 mEq/L (ref 135–145)

## 2018-08-02 LAB — FERRITIN: FERRITIN: 15 ng/mL — AB (ref 22.0–322.0)

## 2018-08-02 MED FILL — OMEPRAZOLE 40 MG CPDR: 40 | 90 days supply | Qty: 180 | Fill #0

## 2018-08-02 MED FILL — levETIRAcetam 750 MG TABS: 750 | 30 days supply | Qty: 60 | Fill #6

## 2018-08-02 NOTE — Progress Notes (Signed)
Pre visit review using our clinic review tool, if applicable. No additional management support is needed unless otherwise documented below in the visit note. 

## 2018-08-02 NOTE — Patient Instructions (Addendum)
GO TO THE LAB : Get the blood work    Close observation for the next 24 to 48 hours:   --Call or go to the ER if you have persistent or severe symptoms. --Symptoms of concern:  Fever, chills, nausea, vomiting, constipation, lack of appetite. Difficulty urinating, blood in the urine

## 2018-08-02 NOTE — Progress Notes (Signed)
Subjective:    Patient ID: James [name], male    DOB: [birth year], 63 y.o.   MRN: [id number]  DOS:  08/02/2018 Type of visit - description: acute Sx started approximately 3 days ago: Has pain located at the right side of the abdomen ("love handle"), see graphic. The area seems swollen to him. The pain comes on and off, mostly depending on how he moves and also even "if the T-shirt touch the area". Denies any rash. History of urolithiasis, symptoms are different from when he had kidney stones. History of chronic back pain, apparently the right side pain is no "connected" or radiating from the back    Review of Systems No fever chills Appetite is increased, "I am been eating a lot lately". Denies nausea, vomiting, diarrhea.  No blood in the stools.  No constipation.  BMs are regular Denies dysuria or gross hematuria  Past Medical History:  Diagnosis Date  . Acid reflux   . Arthritis   . Chronic headaches    on cymbalta  . Cirrhosis ([place])   . Colon polyps   . Complication of anesthesia    problems waking up from anesthesia  . Depression    on cymbalta  . Diabetes mellitus with neuropathy ([place])    borderline per primary MD  . Diverticulitis 03/2013  . Eczema   . Elevated LFTs   . Fatty liver   . History of kidney stones   . Hypertension   . Insomnia 04/26/2013  . Kidney stone   . Morbid obesity ([place])   . Neuromuscular disorder (HCC)    neuropathy  . Neuropathy   . Post-traumatic hydrocephalus (HCC)    s/p shunts x 2 (first got infected )  . Psoriasis    sees Dr [name]  . Psoriatic arthritis ([place])   . 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 Sleep apnea    no CPAP     . Stomach ulcer   . Testosterone deficiency 04/28/2011    Past Surgical History:  Procedure Laterality Date  . BACK SURGERY  1980  . CHOLECYSTECTOMY  08/25/2017   laproscopic   . CHOLECYSTECTOMY N/A 08/25/2017   Procedure: LAPAROSCOPIC CHOLECYSTECTOMY WITH  INTRAOPERATIVE CHOLANGIOGRAM;  Surgeon: [name], MD;  Location: [facility];  Service: General;  Laterality: N/A;  . SHOULDER SURGERY Left 2010  . TOE SURGERY Left 2018  . TOTAL HIP ARTHROPLASTY Left 2011  . VENTRICULOPERITONEAL SHUNT  2007   x2    Social History   Socioeconomic History  . Marital status: Married    Spouse name: [name]  . Number of children: 2  . Years of education: Not on file  . Highest education level: Not on file  Occupational History  . Occupation: disable   Social Needs  . Financial resource strain: Not on file  . Food insecurity:    Worry: Not on file    Inability: Not on file  . Transportation needs:    Medical: Not on file    Non-medical: Not on file  Tobacco Use  . Smoking status: Never Smoker  . Smokeless tobacco: Never Used  Substance and Sexual Activity  . Alcohol use: No  . Drug use: No  . Sexual activity: Yes    Partners: Female  Lifestyle  . Physical activity:    Days per week: Not on file    Minutes per session: Not on file  . Stress: Not on file  Relationships  .  Social connections:    Talks on phone: Not on file    Gets together: Not on file    Attends religious service: Not on file    Active member of club or organization: Not on file    Attends meetings of clubs or organizations: Not on file    Relationship status: Not on file  . Intimate partner violence:    Fear of current or ex partner: Not on file    Emotionally abused: Not on file    Physically abused: Not on file    Forced sexual activity: Not on file  Other Topics Concern  . 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      Allergies as of 08/02/2018      Reactions   Bee Venom Swelling   Hydrocodone-homatropine Other (See Comments)   Depressed feeling   Morphine And Related Other (See Comments)    Hallucinations, back in the 80s. States has taken vicodin before w/o problems    Sulfa Drugs Cross Reactors Rash      Medication List       Accurate as of August 02, 2018 11:31 AM. Always use your most recent med list.        acetaminophen 500 MG tablet Commonly known as:  TYLENOL Take 1,000 mg by mouth every 6 (six) hours as needed for mild pain or headache.   Adalimumab 40 MG/0.8ML Pnkt Commonly known as:  HUMIRA PEN Inject 0.8 mLs into the skin every 14 (fourteen) days. Inject 40 mg (0.8 ml) under the skin every other week.   atorvastatin 10 MG tablet Commonly known as:  LIPITOR TAKE 1 TABLET (10 MG TOTAL) BY MOUTH DAILY.   carvedilol 12.5 MG tablet Commonly known as:  COREG Take 1 tablet (12.5 mg total) by mouth 2 (two) times daily with a meal.   clonazePAM 0.5 MG tablet Commonly known as:  KLONOPIN TAKE 1 TABLET BY MOUTH AT BEDTIME   DULoxetine 60 MG capsule Commonly known as:  CYMBALTA Take 2 capsules (120 mg total) by mouth daily.   EPINEPHrine 0.3 mg/0.3 mL Soaj injection Commonly known as:  EPIPEN 2-PAK Inject 0.3 mLs (0.3 mg total) into the muscle once as needed for up to 1 dose.   fenofibrate micronized 134 MG capsule Commonly known as:  LOFIBRA TAKE 1 CAPSULE (134 MG TOTAL) BY MOUTH DAILY BEFORE BREAKFAST.   FREESTYLE FREEDOM LITE w/Device Kit Use to check blood sugars 3 times daily. Dx code: E11.9   freestyle lancets Use to check blood sugar 3 times daily. Dx code E11.9   gabapentin 600 MG tablet Commonly known as:  NEURONTIN Take 1 tablet (600 mg total) by mouth 3 (three) times daily.   glimepiride 1 MG tablet Commonly known as:  AMARYL TAKE 1 TABLET (1 MG TOTAL) BY MOUTH DAILY BEFORE SUPPER.   glucose blood test strip Commonly known as:  ACCU-CHEK GUIDE Use to test blood sugar three times daily   levETIRAcetam 750 MG tablet Commonly known as:  KEPPRA Take 1 tablet (750 mg total) by mouth 2 (two) times daily.   metFORMIN 1000 MG  tablet Commonly known as:  GLUCOPHAGE TAKE 1 TABLET BY MOUTH TWICE DAILY WITH A MEAL   omeprazole 40 MG capsule Commonly known as:  PRILOSEC Take 1 capsule (40 mg total) by mouth  2 (two) times daily before a meal.   TRULICITY 1.5 NI/7.7OE Sopn Generic drug:  Dulaglutide Inject 1.5 mg into the skin once a week.   valACYclovir 1000 MG tablet Commonly known as:  VALTREX Take 1 tablet (1,000 mg total) by mouth 3 (three) times daily.           Objective:   Physical Exam Abdominal:      BP 132/80 (BP Location: Left Arm, Patient Position: Sitting, Cuff Size: Normal)   Pulse 73   Temp 98.3 F (36.8 C) (Oral)   Resp 16   Ht 5' 11"  (1.803 m)   Wt 271 lb (122.9 kg)   SpO2 97%   BMI 37.80 kg/m  General:   Well developed, NAD, BMI noted. HEENT:  Normocephalic . Face symmetric, atraumatic Lungs:  CTA B Normal respiratory effort, no intercostal retractions, no accessory muscle use. Heart: RRR,  no murmur.  No pretibial edema bilaterally Abdomen: Not distended, soft, globular/symmetric, well-healed surgical scar.  Slightly TTP on the right side without mass or rebound.  See graphic Skin: Not pale. Not jaundice MSK: Thoracolumbar spine no TTP.  + Antalgic posture when he laid down on the examining table. Neurologic:  alert & oriented X3.  Speech normal, gait appropriate for age and unassisted Psych--  Cognition and judgment appear intact.  Cooperative with normal attention span and concentration.  Behavior appropriate. No anxious or depressed appearing.      Assessment      Assessment  DM - Dr [name] Neuropathy (x years, rx gaba 05-2014, w/u 11-2014  RPR neg, vit D-B12-Folic Acid wnl ); Saw Dr Posey Pronto, [place] 1. The electrophysiologic findings are most consistent with a chronic, distal and symmetric sensorimotor polyneuropathy, predominantly axon loss in type, affecting the lower extremities. 2. There is no evidence of a superimposed lumbosacral  radiculopathy. CRI HTN Hyperlipidemia (TG in the 500s 2016) OSA , dx 2012, sleep study again 02-2015 Dr Dohmeier--> severe OSA, intolerant to CPAP Depression, insomnia ----on Cymbalta NEURO: --Chronic headaches -----on Cymbalta --Posttraumatic hydrocephalus s/p 2 shunts (first got infected) MSK: on disability d/t back pain- HAs Hypogonadism  Dx 2012, normal T 11-2014 (on no RX) GI:  --GERD, diverticulitis 2014, h/o PUD -- NASH with cirrhosis per GI note 10/2017, s/p Hep A/B shots Psoriasis, psoriatic arthritis -- on HUMIRA  H/o urolithiasis +FH CAD brother MI age 68  PLAN Right-sided flank/abdominal pain: PMH include cholecystectomy, urolithiasis. Symptoms are atypical for acute appendicitis or kidney stones.  They sound more MSK or neuropathic.  No rash.  He recently had shingles but on the left side. Urinalysis showed no red or white cells. Plan: UA, urine culture, CBC, BMP.  Observe for 24 hours, if fever, chills, nausea, vomiting, constipation, LUTS, he will let me know. Mild anemia: See last visit, checking iron and ferritin.

## 2018-08-03 LAB — URINE CULTURE
MICRO NUMBER:: 147864
Result:: NO GROWTH
SPECIMEN QUALITY:: ADEQUATE

## 2018-08-03 NOTE — Assessment & Plan Note (Signed)
Right-sided flank/abdominal pain: PMH include cholecystectomy, urolithiasis. Symptoms are atypical for acute appendicitis or kidney stones.  They sound more MSK or neuropathic.  No rash.  He recently had shingles but on the left side. Urinalysis showed no red or white cells. Plan: UA, urine culture, CBC, BMP.  Observe for 24 hours, if fever, chills, nausea, vomiting, constipation, LUTS, he will let me know. Mild anemia: See last visit, checking iron and ferritin.

## 2018-08-04 ENCOUNTER — Ambulatory Visit: Payer: 59 | Admitting: Dietician

## 2018-08-05 ENCOUNTER — Other Ambulatory Visit: Payer: Self-pay | Admitting: *Deleted

## 2018-08-05 ENCOUNTER — Ambulatory Visit (INDEPENDENT_AMBULATORY_CARE_PROVIDER_SITE_OTHER): Payer: 59 | Admitting: Orthopaedic Surgery

## 2018-08-05 DIAGNOSIS — D649 Anemia, unspecified: Secondary | ICD-10-CM

## 2018-08-08 ENCOUNTER — Encounter: Payer: 59 | Attending: Endocrinology | Admitting: Dietician

## 2018-08-08 ENCOUNTER — Encounter (INDEPENDENT_AMBULATORY_CARE_PROVIDER_SITE_OTHER): Payer: Self-pay | Admitting: Orthopaedic Surgery

## 2018-08-08 ENCOUNTER — Ambulatory Visit (INDEPENDENT_AMBULATORY_CARE_PROVIDER_SITE_OTHER): Payer: 59

## 2018-08-08 ENCOUNTER — Ambulatory Visit (INDEPENDENT_AMBULATORY_CARE_PROVIDER_SITE_OTHER): Payer: 59 | Admitting: Orthopaedic Surgery

## 2018-08-08 ENCOUNTER — Encounter: Payer: Self-pay | Admitting: Dietician

## 2018-08-08 ENCOUNTER — Other Ambulatory Visit (INDEPENDENT_AMBULATORY_CARE_PROVIDER_SITE_OTHER): Payer: Self-pay | Admitting: Orthopaedic Surgery

## 2018-08-08 VITALS — BP 141/86 | HR 84 | Ht 71.0 in | Wt 260.0 lb

## 2018-08-08 DIAGNOSIS — E1122 Type 2 diabetes mellitus with diabetic chronic kidney disease: Secondary | ICD-10-CM | POA: Insufficient documentation

## 2018-08-08 DIAGNOSIS — M25551 Pain in right hip: Secondary | ICD-10-CM

## 2018-08-08 DIAGNOSIS — G8929 Other chronic pain: Secondary | ICD-10-CM

## 2018-08-08 DIAGNOSIS — M545 Low back pain: Secondary | ICD-10-CM

## 2018-08-08 DIAGNOSIS — N182 Chronic kidney disease, stage 2 (mild): Secondary | ICD-10-CM | POA: Insufficient documentation

## 2018-08-08 DIAGNOSIS — Z96642 Presence of left artificial hip joint: Secondary | ICD-10-CM

## 2018-08-08 DIAGNOSIS — M5442 Lumbago with sciatica, left side: Principal | ICD-10-CM

## 2018-08-08 DIAGNOSIS — M5441 Lumbago with sciatica, right side: Principal | ICD-10-CM

## 2018-08-08 NOTE — Patient Instructions (Addendum)
Consider Vitamin D3 2000 units daily (especially November-February).  Consider reducing the number of regular sodas and other sweetened drinks.  Drink more water.   Continue to share food with your wife when eating out.  Eat slowly and stop when you are satisfied. 1/2 your plate should be non starchy vegetables. Aim for 30 minutes of walking (or light weights) most days. Eat at least 2 meals per day.   Be mindful of your food choices.  Consider checking your blood sugar. Especially for 2 weeks prior to Dr. [name] appointment and bring your meter.

## 2018-08-08 NOTE — Progress Notes (Signed)
Office Visit Note   Patient: James [name]           Date of Birth: November 30, 1955           MRN: [id number] Visit Date: 08/08/2018              Requested by: Colon Branch, [address] [place], [place] [address] PCP: Colon Branch, MD   Assessment & Plan: Visit Diagnoses:  1. Pain in right hip     Plan: Chronic low back pain with bilateral thigh discomfort could be radiculopathy.  Certainly has some mild degenerative changes lumbar spine.  I think it is worth obtaining an MRI scan with the possibility of stenosis and need for therapy and/or epidural steroid injection.  Long discussion over 30 minutes regarding all of the above.  Follow-Up Instructions: Return after MRI L-S spine.   Orders:  Orders Placed This Encounter  Procedures  . XR Lumbar Spine 2-3 Views  . XR Pelvis 1-2 Views   No orders of the defined types were placed in this encounter.     Procedures: No procedures performed   Clinical Data: No additional findings.   Subjective: Chief Complaint  Patient presents with  . Right Hip - Pain  Patient presents today with right hip pain X62month. No known injury. He said that the pain is located on at his buttock on the right side and radiates down the right lateral side of his thigh. The pain is constant but gets worse with bending over. He said that prolonged standing or sitting causes pain as well. He is taking Aleve for pain, which does help. He has a history of T-Spine surgery in 1980.  Has had bilateral leg pain predominate in both thighs more on the right than the left.  He is not experiencing much pain distal to his knees.  He does have history of psoriasis with skin lesions in both legs .  Mr. BGerryis on disability based on "old brain surgery and injury and his arthritis. Has tried exercises but notes that he has had a very difficult time getting up and getting down to even perform the above. HPI  Review of Systems  Constitutional: Negative  for fatigue.  HENT: Negative for ear pain.   Eyes: Negative for pain.  Respiratory: Negative for shortness of breath.   Cardiovascular: Negative for leg swelling.  Gastrointestinal: Negative for constipation and diarrhea.  Endocrine: Negative for cold intolerance and heat intolerance.  Genitourinary: Negative for difficulty urinating.  Musculoskeletal: Negative for joint swelling.  Skin: Negative for rash.  Allergic/Immunologic: Negative for food allergies.  Neurological: Negative for weakness.  Hematological: Does not bruise/bleed easily.  Psychiatric/Behavioral: Negative for sleep disturbance.     Objective: Vital Signs: BP (!) 141/86   Pulse 84   Ht 5' 11"  (1.803 m)   Wt 260 lb (117.9 kg)   BMI 36.26 kg/m   Physical Exam Constitutional:      Appearance: He is well-developed.  Eyes:     Pupils: Pupils are equal, round, and reactive to light.  Pulmonary:     Effort: Pulmonary effort is normal.  Skin:    General: Skin is warm and dry.  Neurological:     Mental Status: He is alert and oriented to person, place, and time.  Psychiatric:        Behavior: Behavior normal.     Ortho Exam painless range of motion of both hips.  Straight leg raise  negative.  Some percussible tenderness along the lumbar spine but mild.  No pain over the sacroiliac joints.  Does have psoriatic skin lesions beneath his knees along the anterior aspect of both legs.  Motor exam intact painless range of motion of both hips with internal and external rotation.  Has had prior left hip replacement Specialty Comments:  No specialty comments available.  Imaging: Xr Lumbar Spine 2-3 Views  Result Date: 08/08/2018 Films of the lumbar spine were obtained in several projections.  There is slight anterior listhesis of L4 on 5.  Mild facet sclerosis at L5-S1.  Disc spaces appear to be well-maintained 5 nonrib-bearing lumbar vertebrae.  No curvature  Xr Pelvis 1-2 Views  Result Date: 08/08/2018 AP the  pelvis demonstrates an intact left total hip replacement without complication.  Minimal degenerative change in the right hip    PMFS History: Patient Active Problem List   Diagnosis Date Noted  . Idiopathic intracranial hypertension 01/14/2017  . REM behavioral disorder 01/14/2017  . Liver cirrhosis secondary to NASH (nonalcoholic steatohepatitis) ([place]) 01/02/2016  . H/O craniotomy 05/07/2015  . Annual physical exam 04/08/2015  . PCP NOTES >>> 04/08/2015  . Hypersomnia with sleep apnea 01/28/2015  . Severe obesity (BMI >= 40) ([facility]) 01/28/2015  . Obstructive hydrocephalus ([place]) 01/28/2015  . Chronic fatigue 01/28/2015  . Depression 09/04/2014  . OSA -- dx ~ 2012, cpap intolerant 09/04/2014  . Insomnia 04/26/2013  . Sigmoid diverticulitis 04/25/2013  . Diabetes with neuropathy 04/25/2013  . Presence of cerebrospinal fluid drainage device 07/28/2011  . Psoriatic arthritis ([facility]) 04/28/2011  . Headache 04/28/2011  . GERD (gastroesophageal reflux disease) 04/28/2011  . Testosterone deficiency 04/28/2011   Past Medical History:  Diagnosis Date  . Acid reflux   . Arthritis   . Chronic headaches    on cymbalta  . Cirrhosis ([place])   . Colon polyps   . Complication of anesthesia    problems waking up from anesthesia  . Depression    on cymbalta  . Diabetes mellitus with neuropathy ([place])    borderline per primary MD  . Diverticulitis 03/2013  . Eczema   . Elevated LFTs   . Fatty liver   . History of kidney stones   . Hypertension   . Insomnia 04/26/2013  . Kidney stone   . Morbid obesity ([place])   . Neuromuscular disorder (HCC)    neuropathy  . Neuropathy   . Post-traumatic hydrocephalus (HCC)    s/p shunts x 2 (first got infected )  . Psoriasis    sees Dr [name]  . Psoriatic arthritis ([facility])   . 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 Sleep apnea    no CPAP     . Stomach ulcer   . Testosterone deficiency 04/28/2011    Family  History  Problem Relation Age of Onset  . Healthy Mother   . Lung cancer Father        alive, former smoker   . Heart disease Brother        MI age 42  . Other Brother        Murdered  . Down syndrome Son   . Diabetes Neg Hx   . Prostate cancer Neg Hx   . Colon cancer Neg Hx   . Stomach cancer Neg Hx     Past Surgical History:  Procedure Laterality Date  . BACK SURGERY  1980  . CHOLECYSTECTOMY  08/25/2017   laproscopic   .  CHOLECYSTECTOMY N/A 08/25/2017   Procedure: LAPAROSCOPIC CHOLECYSTECTOMY WITH INTRAOPERATIVE CHOLANGIOGRAM;  Surgeon: [name], MD;  Location: [facility];  Service: General;  Laterality: N/A;  . SHOULDER SURGERY Left 2010  . TOE SURGERY Left 2018  . TOTAL HIP ARTHROPLASTY Left 2011  . VENTRICULOPERITONEAL SHUNT  2007   x2   Social History   Occupational History  . Occupation: disable   Tobacco Use  . Smoking status: Never Smoker  . Smokeless tobacco: Never Used  Substance and Sexual Activity  . Alcohol use: No  . Drug use: No  . Sexual activity: Yes    Partners: Female
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
## 2018-08-15 ENCOUNTER — Ambulatory Visit
Admission: RE | Admit: 2018-08-15 | Discharge: 2018-08-15 | Disposition: A | Discharge status: Home / Self Care | Payer: 59 | Source: Ambulatory Visit | Attending: Orthopaedic Surgery | Admitting: Orthopaedic Surgery

## 2018-08-15 DIAGNOSIS — M48061 Spinal stenosis, lumbar region without neurogenic claudication: Secondary | ICD-10-CM | POA: Diagnosis not present

## 2018-08-15 DIAGNOSIS — M5441 Lumbago with sciatica, right side: Principal | ICD-10-CM

## 2018-08-15 DIAGNOSIS — M5442 Lumbago with sciatica, left side: Principal | ICD-10-CM

## 2018-08-15 DIAGNOSIS — M5126 Other intervertebral disc displacement, lumbar region: Secondary | ICD-10-CM | POA: Diagnosis not present

## 2018-08-15 DIAGNOSIS — G8929 Other chronic pain: Secondary | ICD-10-CM

## 2018-08-18 MED FILL — GABAPENTIN 600 MG TABS: 600 | 30 days supply | Qty: 90 | Fill #3

## 2018-08-18 MED FILL — metFORMIN HCL 1000 MG TABS: 1000 | 30 days supply | Qty: 60 | Fill #2

## 2018-08-18 MED FILL — DULOXETINE HCL 60 MG CPEP: 60 | 30 days supply | Qty: 60 | Fill #1

## 2018-08-18 MED FILL — clonazePAM 0.5 MG TABS: 0.5 | 30 days supply | Qty: 30 | Fill #4

## 2018-08-22 ENCOUNTER — Other Ambulatory Visit: Payer: Self-pay | Admitting: Pharmacist

## 2018-08-22 DIAGNOSIS — L820 Inflamed seborrheic keratosis: Secondary | ICD-10-CM | POA: Diagnosis not present

## 2018-08-22 DIAGNOSIS — L409 Psoriasis, unspecified: Secondary | ICD-10-CM | POA: Diagnosis not present

## 2018-08-22 DIAGNOSIS — Z79899 Other long term (current) drug therapy: Secondary | ICD-10-CM | POA: Diagnosis not present

## 2018-08-22 DIAGNOSIS — Z23 Encounter for immunization: Secondary | ICD-10-CM | POA: Diagnosis not present

## 2018-08-22 LAB — CBC AND DIFFERENTIAL
HCT: 37 — AB (ref 41–53)
Hemoglobin: 12.2 — AB (ref 13.5–17.5)
Neutrophils Absolute: 3540
Platelets: 157 (ref 150–399)
WBC: 5.9

## 2018-08-22 MED ORDER — ADALIMUMAB 40 MG/0.8ML ~~LOC~~ AJKT: 0.8000 mL | AUTO-INJECTOR | SUBCUTANEOUS | 2 refills | Status: DC

## 2018-08-24 DIAGNOSIS — Z79899 Other long term (current) drug therapy: Secondary | ICD-10-CM | POA: Diagnosis not present

## 2018-08-24 DIAGNOSIS — L409 Psoriasis, unspecified: Secondary | ICD-10-CM | POA: Diagnosis not present

## 2018-08-25 ENCOUNTER — Encounter (INDEPENDENT_AMBULATORY_CARE_PROVIDER_SITE_OTHER): Payer: Self-pay | Admitting: Orthopaedic Surgery

## 2018-08-25 ENCOUNTER — Ambulatory Visit (INDEPENDENT_AMBULATORY_CARE_PROVIDER_SITE_OTHER): Payer: 59 | Admitting: Orthopaedic Surgery

## 2018-08-25 VITALS — BP 136/85 | HR 83 | Ht 71.0 in | Wt 270.0 lb

## 2018-08-25 DIAGNOSIS — M5441 Lumbago with sciatica, right side: Secondary | ICD-10-CM

## 2018-08-25 DIAGNOSIS — G8929 Other chronic pain: Secondary | ICD-10-CM | POA: Diagnosis not present

## 2018-08-25 DIAGNOSIS — M5442 Lumbago with sciatica, left side: Secondary | ICD-10-CM | POA: Diagnosis not present

## 2018-08-25 NOTE — Progress Notes (Signed)
Office Visit Note   Patient: James [name]           Date of Birth: Jun 07, 1956           MRN: [id number] Visit Date: 08/25/2018              Requested by: Colon Branch, [address] [place],  [address] PCP: Colon Branch, MD   Assessment & Plan: Visit Diagnoses:  1. Chronic bilateral low back pain with bilateral sciatica     Plan: MRI scan was performed on February 17 compared to an MRI scan of the lumbar spine in 2011.  There is evidence of progressive epidural lipomatosis with progressive compression of the thecal sac and spinal stenosis at L3-4, L4-5 and L5-S1 compared to the prior study.  He also has progressive facet degeneration and anterior listhesis of at L4-L5 since the study in 2011.  James [name] is experiencing lower extremity claudication.  The further he walks the longer he stands his legs begin to feel "rubbery".  He is diabetic and has evidence of distal neuropathy based on EMGs and nerve conduction studies I discussed the study with him over 30 minutes and even discussed some of the potential treatment options.  He would prefer to consider surgery as he is "tired" of the pain.  I will refer him to Dr. Louanne Skye  Follow-Up Instructions: No follow-ups on file.   Orders:  No orders of the defined types were placed in this encounter.  No orders of the defined types were placed in this encounter.     Procedures: No procedures performed   Clinical Data: No additional findings.   Subjective: Chief Complaint  Patient presents with  . Lower Back - Follow-up  Patient presents today for follow up on his lower back pain. He had an MRI on 08/15/2018. Patient states that his back is hurting worse today. He occasionally takes Aleve as needed. James [name] has a history of hepatic cirrhosis and is careful with any medicines.  He has had chronic pain in his back and wishes to consider surgery rather than pursue any further conservative treatment.  I have  discussed the MRI scan findings with him.  He has had prior EMGs and nerve conduction studies per his own admission that demonstrates diabetic neuropathy HPI  Review of Systems   Objective: Vital Signs: BP 136/85   Pulse 83   Ht 5' 11"  (1.803 m)   Wt 270 lb (122.5 kg)   BMI 37.66 kg/m   Physical Exam Constitutional:      Appearance: He is well-developed.  Eyes:     Pupils: Pupils are equal, round, and reactive to light.  Pulmonary:     Effort: Pulmonary effort is normal.  Skin:    General: Skin is warm and dry.  Neurological:     Mental Status: He is alert and oriented to person, place, and time.  Psychiatric:        Behavior: Behavior normal.     Ortho Exam awake alert and oriented x3.  Comfortable sitting.  Straight leg raise was negative.  He did have +1 pulses a posterior tibial pulses bilaterally.  Feet were warm.  He does have some altered sensibility in his toes and his feet from his diabetes.  Appeared to have good strength and normal motor exam.  Specialty Comments:  No specialty comments available.  Imaging: No results found.   PMFS History: Patient Active Problem List  Diagnosis Date Noted  . Idiopathic intracranial hypertension 01/14/2017  . REM behavioral disorder 01/14/2017  . Liver cirrhosis secondary to NASH (nonalcoholic steatohepatitis) ([place]) 01/02/2016  . H/O craniotomy 05/07/2015  . Annual physical exam 04/08/2015  . PCP NOTES >>> 04/08/2015  . Hypersomnia with sleep apnea 01/28/2015  . Severe obesity (BMI >= 40) ([facility]) 01/28/2015  . Obstructive hydrocephalus ([facility]) 01/28/2015  . Chronic fatigue 01/28/2015  . Depression 09/04/2014  . OSA -- dx ~ 2012, cpap intolerant 09/04/2014  . Insomnia 04/26/2013  . Sigmoid diverticulitis 04/25/2013  . Diabetes with neuropathy 04/25/2013  . Presence of cerebrospinal fluid drainage device 07/28/2011  . Psoriatic arthritis ([facility]) 04/28/2011  . Headache 04/28/2011  . GERD (gastroesophageal reflux disease)  04/28/2011  . Testosterone deficiency 04/28/2011   Past Medical History:  Diagnosis Date  . Acid reflux   . Arthritis   . Chronic headaches    on cymbalta  . Cirrhosis ([facility])   . Colon polyps   . Complication of anesthesia    problems waking up from anesthesia  . Depression    on cymbalta  . Diabetes mellitus with neuropathy ([facility])    borderline per primary MD  . Diverticulitis 03/2013  . Eczema   . Elevated LFTs   . Fatty liver   . History of kidney stones   . Hypertension   . Insomnia 04/26/2013  . Kidney stone   . Morbid obesity ([facility] [place])   . Neuromuscular disorder (HCC)    neuropathy  . Neuropathy   . Post-traumatic hydrocephalus (HCC)    s/p shunts x 2 (first got infected )  . Psoriasis    sees Dr [name]  . Psoriatic arthritis ([facility])   . 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 Sleep apnea    no CPAP     . Stomach ulcer   . Testosterone deficiency 04/28/2011    Family History  Problem Relation Age of Onset  . Healthy Mother   . Lung cancer Father        alive, former smoker   . Heart disease Brother        MI age 36  . Other Brother        Murdered  . Down syndrome Son   . Diabetes Neg Hx   . Prostate cancer Neg Hx   . Colon cancer Neg Hx   . Stomach cancer Neg Hx     Past Surgical History:  Procedure Laterality Date  . BACK SURGERY  1980  . CHOLECYSTECTOMY  08/25/2017   laproscopic   . CHOLECYSTECTOMY N/A 08/25/2017   Procedure: LAPAROSCOPIC CHOLECYSTECTOMY WITH INTRAOPERATIVE CHOLANGIOGRAM;  Surgeon: [name], MD;  Location: [facility];  Service: General;  Laterality: N/A;  . SHOULDER SURGERY Left 2010  . TOE SURGERY Left 2018  . TOTAL HIP ARTHROPLASTY Left 2011  . VENTRICULOPERITONEAL SHUNT  2007   x2   Social History   Occupational History  . Occupation: disable   Tobacco Use  . Smoking status: Never Smoker  . Smokeless tobacco: Never Used  Substance and Sexual Activity  . Alcohol use: No  . Drug use:  No  . Sexual activity: Yes    Partners: Female

## 2018-08-30 ENCOUNTER — Ambulatory Visit (INDEPENDENT_AMBULATORY_CARE_PROVIDER_SITE_OTHER): Payer: 59 | Admitting: Orthopaedic Surgery

## 2018-08-31 ENCOUNTER — Other Ambulatory Visit: Payer: Self-pay | Admitting: Endocrinology

## 2018-08-31 MED FILL — GLIMEPIRIDE 1 MG TABLET: 1 | 30 days supply | Qty: 30 | Fill #1

## 2018-08-31 MED FILL — ATORVASTATIN 10 MG TABLET: 10 | 90 days supply | Qty: 90 | Fill #0

## 2018-08-31 MED FILL — levETIRAcetam 750 MG TABS: 750 | 30 days supply | Qty: 60 | Fill #7

## 2018-09-01 MED FILL — TRIAMCINOLONE 0.1% CREAM: 0.1 | 30 days supply | Qty: 454 | Fill #0

## 2018-09-06 ENCOUNTER — Encounter: Payer: Self-pay | Admitting: Internal Medicine

## 2018-09-06 DIAGNOSIS — G932 Benign intracranial hypertension: Secondary | ICD-10-CM | POA: Diagnosis not present

## 2018-09-06 DIAGNOSIS — Z982 Presence of cerebrospinal fluid drainage device: Secondary | ICD-10-CM | POA: Diagnosis not present

## 2018-09-06 DIAGNOSIS — R400 Somnolence: Secondary | ICD-10-CM | POA: Diagnosis not present

## 2018-09-06 DIAGNOSIS — R410 Disorientation, unspecified: Secondary | ICD-10-CM | POA: Diagnosis not present

## 2018-09-06 DIAGNOSIS — R2681 Unsteadiness on feet: Secondary | ICD-10-CM | POA: Diagnosis not present

## 2018-09-07 ENCOUNTER — Other Ambulatory Visit (HOSPITAL_BASED_OUTPATIENT_CLINIC_OR_DEPARTMENT_OTHER): Payer: Self-pay | Admitting: Neurosurgery

## 2018-09-07 DIAGNOSIS — R410 Disorientation, unspecified: Secondary | ICD-10-CM | POA: Insufficient documentation

## 2018-09-07 DIAGNOSIS — Z982 Presence of cerebrospinal fluid drainage device: Secondary | ICD-10-CM

## 2018-09-07 DIAGNOSIS — Z9289 Personal history of other medical treatment: Secondary | ICD-10-CM | POA: Insufficient documentation

## 2018-09-08 ENCOUNTER — Encounter (INDEPENDENT_AMBULATORY_CARE_PROVIDER_SITE_OTHER): Payer: Self-pay | Admitting: Specialist

## 2018-09-08 ENCOUNTER — Ambulatory Visit (INDEPENDENT_AMBULATORY_CARE_PROVIDER_SITE_OTHER): Payer: Self-pay

## 2018-09-08 ENCOUNTER — Ambulatory Visit (INDEPENDENT_AMBULATORY_CARE_PROVIDER_SITE_OTHER): Payer: 59 | Admitting: Specialist

## 2018-09-08 ENCOUNTER — Other Ambulatory Visit: Payer: Self-pay

## 2018-09-08 VITALS — BP 123/82 | HR 85 | Ht 71.0 in | Wt 270.0 lb

## 2018-09-08 DIAGNOSIS — M4316 Spondylolisthesis, lumbar region: Secondary | ICD-10-CM | POA: Diagnosis not present

## 2018-09-08 DIAGNOSIS — Z981 Arthrodesis status: Secondary | ICD-10-CM | POA: Diagnosis not present

## 2018-09-08 DIAGNOSIS — M4804 Spinal stenosis, thoracic region: Secondary | ICD-10-CM

## 2018-09-08 DIAGNOSIS — M5442 Lumbago with sciatica, left side: Secondary | ICD-10-CM

## 2018-09-08 DIAGNOSIS — M5441 Lumbago with sciatica, right side: Secondary | ICD-10-CM

## 2018-09-08 DIAGNOSIS — G8929 Other chronic pain: Secondary | ICD-10-CM | POA: Diagnosis not present

## 2018-09-08 DIAGNOSIS — M48062 Spinal stenosis, lumbar region with neurogenic claudication: Secondary | ICD-10-CM

## 2018-09-08 DIAGNOSIS — Z982 Presence of cerebrospinal fluid drainage device: Secondary | ICD-10-CM

## 2018-09-08 DIAGNOSIS — M4807 Spinal stenosis, lumbosacral region: Secondary | ICD-10-CM

## 2018-09-08 DIAGNOSIS — M4324 Fusion of spine, thoracic region: Secondary | ICD-10-CM | POA: Diagnosis not present

## 2018-09-08 NOTE — Progress Notes (Signed)
Office Visit Note   Patient: James [name]           Date of Birth: [birth year]           MRN: [id number] Visit Date: 09/08/2018              Requested by: Colon Branch, [address] [place], [place] [address] PCP: Colon Branch, MD   Assessment & Plan: Visit Diagnoses:  1. Chronic bilateral low back pain with bilateral sciatica   2. Spinal stenosis of lumbar region with neurogenic claudication   3. Spondylolisthesis, lumbar region   4. Spinal stenosis of lumbosacral region   5. Arthrodesis status   6. Spinal stenosis of thoracic region   7. Fusion of spine, thoracic region   8. S/P VP shunt     Plan: Avoid bending, stooping and avoid lifting weights greater than 10 lbs. Avoid prolong standing and walking. Avoid frequent bending and stooping  No lifting greater than 10 lbs. May use ice or moist heat for pain. Weight loss is of benefit. Handicap license is approved. Myelogram of the lumbar and thoracici spine and post myelogram CT scan.  Follow-Up Instructions: Return in about 4 weeks (around 10/06/2018).   Orders:  Orders Placed This Encounter  Procedures  . XR Lumb Spine Flex&Ext Only  . DG Myelogram Lumbar  . CT LUMBAR SPINE W CONTRAST  . DG Myelogram Thoracic  . CT THORACIC SPINE W CONTRAST  . Ambulatory referral to Neurosurgery   No orders of the defined types were placed in this encounter.     Procedures: No procedures performed   Clinical Data: No additional findings.   Subjective: Chief Complaint  Patient presents with  . Lower Back - Follow-up    63 year old male with history of upper T3-5 fusion with rods and screws with paralysis for 2 1/2 months had a shunt placed for build up of spinal fluid in the brain in 2007. He now describes problems with activity related low back pain. Increases with standing and walking. Unexpected trips to [place] with significant pain. Trying to do to much worsens the pain. The bathroom is undergoing  remodelling and he is having to go down stairs to the toilet. He has pain sitting still About a "4" out of 10. He feels a know to the right at the L3-4 level. No bowel or bladder difficulty. He would not be able to walk a mile.He is making short trips to the grocery store and avoiding prolong standing. He is taking aleve for pain.      Review of Systems  Constitutional: Negative for activity change, appetite change, chills, diaphoresis, fatigue, fever and unexpected weight change.  HENT: Negative.  Negative for congestion, dental problem, drooling, ear pain, facial swelling, hearing loss, mouth sores, nosebleeds, postnasal drip, rhinorrhea, sinus pressure, sinus pain, sneezing, sore throat, tinnitus, trouble swallowing and voice change.   Eyes: Negative.  Negative for photophobia, pain, discharge, redness, itching and visual disturbance.  Respiratory: Negative.  Negative for apnea, cough, choking, chest tightness, shortness of breath, wheezing and stridor.   Cardiovascular: Negative.  Negative for chest pain, palpitations and leg swelling.  Gastrointestinal: Positive for abdominal distention. Negative for abdominal pain, anal bleeding, blood in stool and constipation.  Endocrine: Negative.   Genitourinary: Negative.  Negative for difficulty urinating, dysuria and enuresis.  Musculoskeletal: Positive for back pain and gait problem. Negative for arthralgias, joint swelling, myalgias, neck pain and neck stiffness.  Skin: Negative.   Allergic/Immunologic: Negative.  Negative for environmental allergies, food allergies and immunocompromised state.  Neurological: Negative for dizziness, tremors, seizures, syncope, facial asymmetry, speech difficulty, weakness, light-headedness, numbness and headaches.  Hematological: Negative.  Negative for adenopathy. Does not bruise/bleed easily.  Psychiatric/Behavioral: Negative.  Negative for agitation, behavioral problems, confusion, decreased concentration,  dysphoric mood, hallucinations, self-injury, sleep disturbance and suicidal ideas. The patient is not nervous/anxious and is not hyperactive.      Objective: Vital Signs: BP 123/82 (BP Location: Left Arm, Patient Position: Sitting)   Pulse 85   Ht 5' 11"  (1.803 m)   Wt 270 lb (122.5 kg)   BMI 37.66 kg/m   Physical Exam  Back Exam   Tenderness  The patient is experiencing tenderness in the lumbar.  Range of Motion  Extension: abnormal  Flexion:  70 normal  Lateral bend right: abnormal  Rotation right: abnormal   Muscle Strength  Right Quadriceps:  5/5  Left Quadriceps:  5/5  Right Hamstrings:  5/5  Left Hamstrings:  5/5   Reflexes  Achilles: abnormal  Other  Toe walk: abnormal Heel walk: abnormal Sensation: normal Gait: normal       Specialty Comments:  No specialty comments available.  Imaging: Xr Lumb Spine Flex&ext Only  Result Date: 09/08/2018 Lateral flexion and extension radiographs of the lumbar spine show spondylolisthesis that worsens with flexion from 4 mm to 6-7 mm. Spondylosis of the facets at L4-5 and disc narrowing L4-5.    PMFS History: Patient Active Problem List   Diagnosis Date Noted  . Idiopathic intracranial hypertension 01/14/2017  . REM behavioral disorder 01/14/2017  . Liver cirrhosis secondary to NASH (nonalcoholic steatohepatitis) ([place]) 01/02/2016  . H/O craniotomy 05/07/2015  . Annual physical exam 04/08/2015  . PCP NOTES >>> 04/08/2015  . Hypersomnia with sleep apnea 01/28/2015  . Severe obesity (BMI >= 40) ([place]) 01/28/2015  . Obstructive hydrocephalus ([place]) 01/28/2015  . Chronic fatigue 01/28/2015  . Depression 09/04/2014  . OSA -- dx ~ 2012, cpap intolerant 09/04/2014  . Insomnia 04/26/2013  . Sigmoid diverticulitis 04/25/2013  . Diabetes with neuropathy 04/25/2013  . Presence of cerebrospinal fluid drainage device 07/28/2011  . Psoriatic arthritis ([place]) 04/28/2011  . Headache 04/28/2011  . GERD (gastroesophageal  reflux disease) 04/28/2011  . Testosterone deficiency 04/28/2011   Past Medical History:  Diagnosis Date  . Acid reflux   . Arthritis   . Chronic headaches    on cymbalta  . Cirrhosis ([place])   . Colon polyps   . Complication of anesthesia    problems waking up from anesthesia  . Depression    on cymbalta  . Diabetes mellitus with neuropathy ([place])    borderline per primary MD  . Diverticulitis 03/2013  . Eczema   . Elevated LFTs   . Fatty liver   . History of kidney stones   . Hypertension   . Insomnia 04/26/2013  . Kidney stone   . Morbid obesity ([place])   . Neuromuscular disorder ([facility])    neuropathy  . Neuropathy   . Post-traumatic hydrocephalus ([facility])    s/p shunts x 2 (first got infected )  . Psoriasis    sees Dr [name]  . Psoriatic arthritis ([place])   . 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 Sleep apnea    no CPAP     . Stomach ulcer   . Testosterone deficiency 04/28/2011    Family History  Problem Relation Age of Onset  .  Healthy Mother   . Lung cancer Father        alive, former smoker   . Heart disease Brother        MI age 46  . Other Brother        Murdered  . Down syndrome Son   . Diabetes Neg Hx   . Prostate cancer Neg Hx   . Colon cancer Neg Hx   . Stomach cancer Neg Hx     Past Surgical History:  Procedure Laterality Date  . BACK SURGERY  1980  . CHOLECYSTECTOMY  08/25/2017   laproscopic   . CHOLECYSTECTOMY N/A 08/25/2017   Procedure: LAPAROSCOPIC CHOLECYSTECTOMY WITH INTRAOPERATIVE CHOLANGIOGRAM;  Surgeon: [name], MD;  Location: [facility];  Service: General;  Laterality: N/A;  . SHOULDER SURGERY Left 2010  . TOE SURGERY Left 2018  . TOTAL HIP ARTHROPLASTY Left 2011  . VENTRICULOPERITONEAL SHUNT  2007   x2   Social History   Occupational History  . Occupation: disable   Tobacco Use  . Smoking status: Never Smoker  . Smokeless tobacco: Never Used  Substance and Sexual Activity  . Alcohol use: No   . Drug use: No  . Sexual activity: Yes    Partners: Female

## 2018-09-08 NOTE — Patient Instructions (Signed)
Plan: Avoid bending, stooping and avoid lifting weights greater than 10 lbs. Avoid prolong standing and walking. Avoid frequent bending and stooping  No lifting greater than 10 lbs. May use ice or moist heat for pain. Weight loss is of benefit. Handicap license is approved. Myelogram of the lumbar and thoracici spine and post myelogram CT scan.

## 2018-09-09 ENCOUNTER — Ambulatory Visit: Payer: 59 | Admitting: Internal Medicine

## 2018-09-09 ENCOUNTER — Telehealth: Payer: Self-pay

## 2018-09-09 ENCOUNTER — Ambulatory Visit (HOSPITAL_BASED_OUTPATIENT_CLINIC_OR_DEPARTMENT_OTHER)
Admission: RE | Admit: 2018-09-09 | Discharge: 2018-09-09 | Disposition: A | Discharge status: Home / Self Care | Payer: 59 | Source: Ambulatory Visit | Attending: Neurosurgery | Admitting: Neurosurgery

## 2018-09-09 ENCOUNTER — Other Ambulatory Visit (HOSPITAL_BASED_OUTPATIENT_CLINIC_OR_DEPARTMENT_OTHER): Payer: Self-pay | Admitting: Neurosurgery

## 2018-09-09 DIAGNOSIS — R410 Disorientation, unspecified: Secondary | ICD-10-CM | POA: Insufficient documentation

## 2018-09-09 DIAGNOSIS — Z982 Presence of cerebrospinal fluid drainage device: Secondary | ICD-10-CM

## 2018-09-09 DIAGNOSIS — R51 Headache: Secondary | ICD-10-CM | POA: Diagnosis not present

## 2018-09-09 NOTE — Telephone Encounter (Signed)
Phone call to patient to verify medication list and allergies for myelogram procedure. Pt instructed to hold Cymbalta for 48hrs prior to myelogram appointment time. Pt verbalized understanding. Pt denies fever, cough, or travel outside of the [place] in the past 14 days.

## 2018-09-13 ENCOUNTER — Ambulatory Visit: Payer: 59 | Admitting: Internal Medicine

## 2018-09-14 ENCOUNTER — Other Ambulatory Visit: Payer: Self-pay | Admitting: Neurology

## 2018-09-14 MED FILL — HUMIRA PEN 40 MG/0.8ML PNKT: 40 | 28 days supply | Qty: 2 | Fill #0

## 2018-09-14 MED FILL — GLIMEPIRIDE 1 MG TABLET: 1 | 30 days supply | Qty: 30 | Fill #2

## 2018-09-14 MED FILL — CARVEDILOL 12.5 MG TABLET: 12.5 | 90 days supply | Qty: 180 | Fill #1

## 2018-09-14 MED FILL — metFORMIN HCL 1000 MG TABS: 1000 | 90 days supply | Qty: 180 | Fill #3

## 2018-09-14 MED FILL — GABAPENTIN 600 MG TABS: 600 | 30 days supply | Qty: 90 | Fill #4

## 2018-09-14 MED FILL — ATORVASTATIN 10 MG TABLET: 10 | 30 days supply | Qty: 30 | Fill #1

## 2018-09-14 MED FILL — OMEPRAZOLE 40 MG CPDR: 40 | 90 days supply | Qty: 180 | Fill #1

## 2018-09-14 MED FILL — DULOXETINE HCL 60 MG CPEP: 60 | 30 days supply | Qty: 60 | Fill #2

## 2018-09-16 ENCOUNTER — Encounter: Payer: Self-pay | Admitting: Internal Medicine

## 2018-09-16 ENCOUNTER — Other Ambulatory Visit: Payer: Self-pay | Admitting: Neurology

## 2018-09-19 ENCOUNTER — Ambulatory Visit: Payer: Self-pay | Admitting: *Deleted

## 2018-09-19 ENCOUNTER — Emergency Department (HOSPITAL_BASED_OUTPATIENT_CLINIC_OR_DEPARTMENT_OTHER): Payer: 59

## 2018-09-19 ENCOUNTER — Other Ambulatory Visit: Payer: Self-pay

## 2018-09-19 ENCOUNTER — Emergency Department (HOSPITAL_BASED_OUTPATIENT_CLINIC_OR_DEPARTMENT_OTHER)
Admission: EM | Admit: 2018-09-19 | Discharge: 2018-09-19 | Disposition: A | Discharge status: Home / Self Care | Payer: 59 | Attending: Emergency Medicine | Admitting: Emergency Medicine

## 2018-09-19 ENCOUNTER — Other Ambulatory Visit: Payer: 59

## 2018-09-19 ENCOUNTER — Encounter (HOSPITAL_BASED_OUTPATIENT_CLINIC_OR_DEPARTMENT_OTHER): Payer: Self-pay | Admitting: *Deleted

## 2018-09-19 DIAGNOSIS — Z7984 Long term (current) use of oral hypoglycemic drugs: Secondary | ICD-10-CM | POA: Diagnosis not present

## 2018-09-19 DIAGNOSIS — R079 Chest pain, unspecified: Secondary | ICD-10-CM | POA: Diagnosis not present

## 2018-09-19 DIAGNOSIS — F329 Major depressive disorder, single episode, unspecified: Secondary | ICD-10-CM | POA: Diagnosis not present

## 2018-09-19 DIAGNOSIS — R42 Dizziness and giddiness: Secondary | ICD-10-CM | POA: Insufficient documentation

## 2018-09-19 DIAGNOSIS — R1013 Epigastric pain: Secondary | ICD-10-CM | POA: Insufficient documentation

## 2018-09-19 DIAGNOSIS — K7460 Unspecified cirrhosis of liver: Secondary | ICD-10-CM | POA: Diagnosis not present

## 2018-09-19 DIAGNOSIS — I10 Essential (primary) hypertension: Secondary | ICD-10-CM | POA: Insufficient documentation

## 2018-09-19 DIAGNOSIS — Z982 Presence of cerebrospinal fluid drainage device: Secondary | ICD-10-CM | POA: Diagnosis not present

## 2018-09-19 DIAGNOSIS — Z96642 Presence of left artificial hip joint: Secondary | ICD-10-CM | POA: Diagnosis not present

## 2018-09-19 DIAGNOSIS — R748 Abnormal levels of other serum enzymes: Secondary | ICD-10-CM | POA: Diagnosis not present

## 2018-09-19 DIAGNOSIS — Z79899 Other long term (current) drug therapy: Secondary | ICD-10-CM | POA: Insufficient documentation

## 2018-09-19 DIAGNOSIS — R072 Precordial pain: Secondary | ICD-10-CM | POA: Diagnosis not present

## 2018-09-19 DIAGNOSIS — Z9049 Acquired absence of other specified parts of digestive tract: Secondary | ICD-10-CM | POA: Insufficient documentation

## 2018-09-19 DIAGNOSIS — R109 Unspecified abdominal pain: Secondary | ICD-10-CM | POA: Diagnosis not present

## 2018-09-19 LAB — COMPREHENSIVE METABOLIC PANEL
ALT: 44 U/L (ref 0–44)
AST: 45 U/L — ABNORMAL HIGH (ref 15–41)
Albumin: 3.9 g/dL (ref 3.5–5.0)
Alkaline Phosphatase: 91 U/L (ref 38–126)
Anion gap: 9 (ref 5–15)
BUN: 26 mg/dL — ABNORMAL HIGH (ref 6–20)
CO2: 23 mmol/L (ref 22–32)
Calcium: 9.6 mg/dL (ref 8.9–10.3)
Chloride: 103 mmol/L (ref 98–111)
Creatinine, Ser: 1.13 mg/dL (ref 0.61–1.24)
GFR calc Af Amer: >60 mL/min (ref >60)
GFR calc non Af Amer: >60 mL/min (ref >60)
Glucose, Bld: 187 mg/dL — ABNORMAL HIGH (ref 70–99)
POTASSIUM: 4.7 mmol/L (ref 3.5–5.1)
Sodium: 135 mmol/L (ref 135–145)
TOTAL PROTEIN: 8 g/dL (ref 6.5–8.1)
Total Bilirubin: 0.7 mg/dL (ref 0.3–1.2)

## 2018-09-19 LAB — CBC WITH DIFFERENTIAL/PLATELET
Abs Immature Granulocytes: 0.02 10*3/uL (ref 0.00–0.07)
BASOS ABS: 0 10*3/uL (ref 0.0–0.1)
Basophils Relative: 0 %
Eosinophils Absolute: 0.3 10*3/uL (ref 0.0–0.5)
Eosinophils Relative: 5 %
HCT: 36.6 % — ABNORMAL LOW (ref 39.0–52.0)
Hemoglobin: 11.4 g/dL — ABNORMAL LOW (ref 13.0–17.0)
Immature Granulocytes: 0 %
Lymphocytes Relative: 28 %
Lymphs Abs: 1.8 10*3/uL (ref 0.7–4.0)
MCH: 26.3 pg (ref 26.0–34.0)
MCHC: 31.1 g/dL (ref 30.0–36.0)
MCV: 84.5 fL (ref 80.0–100.0)
Monocytes Absolute: 0.5 10*3/uL (ref 0.1–1.0)
Monocytes Relative: 7 %
Neutro Abs: 3.9 10*3/uL (ref 1.7–7.7)
Neutrophils Relative %: 60 %
Platelets: 164 10*3/uL (ref 150–400)
RBC: 4.33 MIL/uL (ref 4.22–5.81)
RDW: 16.6 % — AB (ref 11.5–15.5)
WBC: 6.5 10*3/uL (ref 4.0–10.5)
nRBC: 0 % (ref 0.0–0.2)

## 2018-09-19 LAB — TROPONIN I
Troponin I: <0.03 ng/mL (ref <0.03)
Troponin I: <0.03 ng/mL (ref <0.03)

## 2018-09-19 LAB — LIPASE, BLOOD: Lipase: 88 U/L — ABNORMAL HIGH (ref 11–51)

## 2018-09-19 MED ORDER — DIPHENHYDRAMINE HCL 50 MG/ML IJ SOLN
25.0000 mg | Freq: Once | INTRAMUSCULAR | Status: AC
  Administered 2018-09-19: 25 mg via INTRAVENOUS
  Filled 2018-09-19: qty 1

## 2018-09-19 MED ORDER — LIDOCAINE VISCOUS HCL 2 % MT SOLN
15.0000 mL | Freq: Once | OROMUCOSAL | Status: AC
  Administered 2018-09-19: 15 mL via ORAL
  Filled 2018-09-19: qty 15

## 2018-09-19 MED ORDER — ASPIRIN 325 MG PO TABS
325.0000 mg | ORAL_TABLET | Freq: Once | ORAL | Status: AC
  Administered 2018-09-19: 325 mg via ORAL
  Filled 2018-09-19: qty 1

## 2018-09-19 MED ORDER — METOCLOPRAMIDE HCL 5 MG/ML IJ SOLN
10.0000 mg | Freq: Once | INTRAMUSCULAR | Status: AC
  Administered 2018-09-19: 10 mg via INTRAVENOUS
  Filled 2018-09-19: qty 2

## 2018-09-19 MED ORDER — ALUM & MAG HYDROXIDE-SIMETH 200-200-20 MG/5ML PO SUSP
30.0000 mL | Freq: Once | ORAL | Status: AC
  Administered 2018-09-19: 30 mL via ORAL
  Filled 2018-09-19: qty 30

## 2018-09-19 MED ORDER — SODIUM CHLORIDE 0.9% FLUSH
3.0000 mL | Freq: Once | INTRAVENOUS | Status: AC
  Administered 2018-09-19: 3 mL via INTRAVENOUS
  Filled 2018-09-19: qty 3

## 2018-09-19 MED ORDER — FAMOTIDINE 20 MG PO TABS
20.0000 mg | ORAL_TABLET | Freq: Once | ORAL | Status: AC
  Administered 2018-09-19: 20 mg via ORAL
  Filled 2018-09-19: qty 1

## 2018-09-19 MED ORDER — SODIUM CHLORIDE 0.9 % IV BOLUS
1000.0000 mL | Freq: Once | INTRAVENOUS | Status: AC
  Administered 2018-09-19: 1000 mL via INTRAVENOUS

## 2018-09-19 MED ORDER — HYDROMORPHONE HCL 1 MG/ML IJ SOLN: 1.0000 mg | Freq: Once | INTRAMUSCULAR | Status: DC

## 2018-09-19 MED ORDER — IOHEXOL 300 MG/ML  SOLN
100.0000 mL | Freq: Once | INTRAMUSCULAR | Status: AC | PRN
  Administered 2018-09-19: 100 mL via INTRAVENOUS

## 2018-09-19 MED ORDER — SUCRALFATE 1 G PO TABS: 1.0000 g | ORAL_TABLET | Freq: Three times a day (TID) | ORAL | 0 refills | Status: DC | PRN

## 2018-09-19 NOTE — ED Triage Notes (Signed)
Pt c/o mid sternal chest pain x 4 hrs

## 2018-09-19 NOTE — ED Provider Notes (Signed)
[facility] EMERGENCY DEPARTMENT Provider Note   CSN: [id number] Arrival date & time: 09/19/18  1837    History   Chief Complaint Chief Complaint  Patient presents with  . Chest Pain    HPI James [name] is a 63 y.o. male history of hypertension, borderline diabetes, cirrhosis here presenting with chest pain.  Patient states that he has substernal chest pain and epigastric pain for the last 4 hours.  States that it is a dull achy pain.  Denies any radiation to the pain.  Associated with some nausea but no vomiting.  Denies any shortness of breath.  Patient denies any recent travel or sick contacts or fever or cough.  Of note, patient has been having dizziness for the last several weeks.  He has a VP shunt and had a CT head and shunt series that were unremarkable about 10 days ago.  Patient also saw neurosurgeon several days ago for his back pain as well.  Denies any worsening back pain or leg pain or numbness or weakness.  Patient has no known CAD and saw cardiology about a year ago and had a nuclear stress test that was unremarkable.     The history is provided by the patient.    Past Medical History:  Diagnosis Date  . Acid reflux   . Arthritis   . Chronic headaches    on cymbalta  . Cirrhosis ([place])   . Colon polyps   . Complication of anesthesia    problems waking up from anesthesia  . Depression    on cymbalta  . Diabetes mellitus with neuropathy ([place])    borderline per primary MD  . Diverticulitis 03/2013  . Eczema   . Elevated LFTs   . Fatty liver   . History of kidney stones   . Hypertension   . Insomnia 04/26/2013  . Kidney stone   . Morbid obesity ([place])   . Neuromuscular disorder (HCC)    neuropathy  . Neuropathy   . Post-traumatic hydrocephalus ([facility])    s/p shunts x 2 (first got infected )  . Psoriasis    sees Dr [name]  . Psoriatic arthritis ([place])   . 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 Sleep apnea     no CPAP     . Stomach ulcer   . Testosterone deficiency 04/28/2011    Patient Active Problem List   Diagnosis Date Noted  . Idiopathic intracranial hypertension 01/14/2017  . REM behavioral disorder 01/14/2017  . Liver cirrhosis secondary to NASH (nonalcoholic steatohepatitis) ([place]) 01/02/2016  . H/O craniotomy 05/07/2015  . Annual physical exam 04/08/2015  . PCP NOTES >>> 04/08/2015  . Hypersomnia with sleep apnea 01/28/2015  . Severe obesity (BMI >= 40) ([place]) 01/28/2015  . Obstructive hydrocephalus ([place]) 01/28/2015  . Chronic fatigue 01/28/2015  . Depression 09/04/2014  . OSA -- dx ~ 2012, cpap intolerant 09/04/2014  . Insomnia 04/26/2013  . Sigmoid diverticulitis 04/25/2013  . Diabetes with neuropathy 04/25/2013  . Presence of cerebrospinal fluid drainage device 07/28/2011  . Psoriatic arthritis ([place]) 04/28/2011  . Headache 04/28/2011  . GERD (gastroesophageal reflux disease) 04/28/2011  . Testosterone deficiency 04/28/2011    Past Surgical History:  Procedure Laterality Date  . BACK SURGERY  1980  . CHOLECYSTECTOMY  08/25/2017   laproscopic   . CHOLECYSTECTOMY N/A 08/25/2017   Procedure: LAPAROSCOPIC CHOLECYSTECTOMY WITH INTRAOPERATIVE CHOLANGIOGRAM;  Surgeon: [name], MD;  Location: [facility];  Service: General;  Laterality: N/A;  . SHOULDER SURGERY Left 2010  . TOE SURGERY Left 2018  . TOTAL HIP ARTHROPLASTY Left 2011  . VENTRICULOPERITONEAL SHUNT  2007   x2        Home Medications    Prior to Admission medications   Medication Sig Start Date End Date Taking? Authorizing Provider  acetaminophen (TYLENOL) 500 MG tablet Take 1,000 mg by mouth every 6 (six) hours as needed for mild pain or headache.    [provider]  Adalimumab (HUMIRA PEN) 40 MG/0.8ML PNKT Inject 0.8 mLs into the skin every 14 (fourteen) days. Inject 40 mg (0.8 ml) under the skin every other week. 08/22/18   Tresa Garter, MD  atorvastatin (LIPITOR) 10 MG tablet TAKE 1  TABLET (10 MG TOTAL) BY MOUTH DAILY. 08/31/18   Elayne Snare, MD  Blood Glucose Monitoring Suppl (FREESTYLE FREEDOM LITE) w/Device KIT Use to check blood sugars 3 times daily. Dx code: E11.9 10/29/16   Elayne Snare, MD  carvedilol (COREG) 12.5 MG tablet Take 1 tablet (12.5 mg total) by mouth 2 (two) times daily with a meal. 06/27/18   Colon Branch, MD  clonazePAM (KLONOPIN) 0.5 MG tablet TAKE 1 TABLET BY MOUTH AT BEDTIME 04/13/18   Sater, Nanine Means, MD  DULoxetine (CYMBALTA) 60 MG capsule Take 2 capsules (120 mg total) by mouth daily. 07/21/18   Colon Branch, MD  EPINEPHrine (EPIPEN 2-PAK) 0.3 mg/0.3 mL IJ SOAJ injection Inject 0.3 mLs (0.3 mg total) into the muscle once as needed for up to 1 dose. 01/19/18   Colon Branch, MD  fenofibrate micronized (LOFIBRA) 134 MG capsule TAKE 1 CAPSULE (134 MG TOTAL) BY MOUTH DAILY BEFORE BREAKFAST. 07/07/17   Elayne Snare, MD  gabapentin (NEURONTIN) 600 MG tablet Take 1 tablet (600 mg total) by mouth 3 (three) times daily. 03/31/18   Colon Branch, MD  glimepiride (AMARYL) 1 MG tablet TAKE 1 TABLET (1 MG TOTAL) BY MOUTH DAILY BEFORE SUPPER. 07/21/18   Elayne Snare, MD  glucose blood (ACCU-CHEK GUIDE) test strip Use to test blood sugar three times daily 07/19/17   Elayne Snare, MD  Lancets (FREESTYLE) lancets Use to check blood sugar 3 times daily. Dx code E11.9 10/29/16   Elayne Snare, MD  levETIRAcetam (KEPPRA) 750 MG tablet Take 1 tablet (750 mg total) by mouth 2 (two) times daily. 09/09/17   Sater, Nanine Means, MD  metFORMIN (GLUCOPHAGE) 1000 MG tablet TAKE 1 TABLET BY MOUTH TWICE DAILY WITH A MEAL 06/21/18   Elayne Snare, MD  naproxen sodium (ALEVE) 220 MG tablet Take 220 mg by mouth.    [provider]  omeprazole (PRILOSEC) 40 MG capsule Take 1 capsule (40 mg total) by mouth 2 (two) times daily before a meal. 05/23/18   Colon Branch, MD  triamcinolone cream (KENALOG) 0.1 %  08/22/18   [provider]  TRULICITY 1.5 LO/7.5IE SOPN Inject 1.5 mg into the skin once a week.  10/01/17   Colon Branch, MD  valACYclovir (VALTREX) 1000 MG tablet Take 1 tablet (1,000 mg total) by mouth 3 (three) times daily. 06/10/18   Colon Branch, MD    Family History Family History  Problem Relation Age of Onset  . Healthy Mother   . Lung cancer Father        alive, former smoker   . Heart disease Brother        MI age 10  . Other Brother  Murdered  . Down syndrome Son   . Diabetes Neg Hx   . Prostate cancer Neg Hx   . Colon cancer Neg Hx   . Stomach cancer Neg Hx     Social History Social History   Tobacco Use  . Smoking status: Never Smoker  . Smokeless tobacco: Never Used  Substance Use Topics  . Alcohol use: No  . Drug use: No     Allergies   Bee venom; Hydrocodone-homatropine; Morphine and related; and Sulfa drugs cross reactors   Review of Systems Review of Systems  Cardiovascular: Positive for chest pain.  All other systems reviewed and are negative.    Physical Exam Updated Vital Signs BP (!) 143/94   Pulse 85   Temp 98.2 F (36.8 C)   Resp (!) 22   Ht 5' 11" (1.803 m)   Wt 122.4 kg   SpO2 95%   BMI 37.64 kg/m   Physical Exam Vitals signs and nursing note reviewed.  Constitutional:      Appearance: He is well-developed.  HENT:     Head: Normocephalic.  Eyes:     Extraocular Movements: Extraocular movements intact.     Pupils: Pupils are equal, round, and reactive to light.  Neck:     Musculoskeletal: Normal range of motion and neck supple.  Cardiovascular:     Rate and Rhythm: Normal rate and regular rhythm.     Heart sounds: Normal heart sounds.  Pulmonary:     Effort: Pulmonary effort is normal.     Breath sounds: Normal breath sounds.  Abdominal:     Palpations: Abdomen is soft.     Comments: Minimal epigastric tenderness   Musculoskeletal: Normal range of motion.  Skin:    General: Skin is warm.     Capillary Refill: Capillary refill takes less than 2 seconds.  Neurological:     General: No focal deficit  present.     Mental Status: He is alert and oriented to person, place, and time.  Psychiatric:        Mood and Affect: Mood normal.        Behavior: Behavior normal.      ED Treatments / Results  Labs (all labs ordered are listed, but only abnormal results are displayed) Labs Reviewed  CBC WITH DIFFERENTIAL/PLATELET - Abnormal; Notable for the following components:      Result Value   Hemoglobin 11.4 (*)    HCT 36.6 (*)    RDW 16.6 (*)    All other components within normal limits  COMPREHENSIVE METABOLIC PANEL - Abnormal; Notable for the following components:   Glucose, Bld 187 (*)    BUN 26 (*)    AST 45 (*)    All other components within normal limits  LIPASE, BLOOD - Abnormal; Notable for the following components:   Lipase 88 (*)    All other components within normal limits  TROPONIN I    EKG EKG Interpretation  Date/Time:  Monday September 19 2018 18:45:42 EDT Ventricular Rate:  92 PR Interval:  126 QRS Duration: 90 QT Interval:  340 QTC Calculation: 420 R Axis:   -17 Text Interpretation:  Normal sinus rhythm with sinus arrhythmia Moderate voltage criteria for LVH, may be normal variant Lateral infarct , age undetermined Inferior infarct , age undetermined Abnormal ECG No significant change since last tracing Confirmed by Wandra Arthurs ([id number]) on 09/19/2018 6:49:41 PM   Radiology Dg Chest 2 View  Result Date: 09/19/2018 CLINICAL DATA:  Chest  pain for 4 hours. History of gastroesophageal reflux disease. EXAM: CHEST - 2 VIEW COMPARISON:  09/25/2017 FINDINGS: Upper thoracic spine fixation. Mild right hemidiaphragm elevation. Normal heart size and mediastinal contours. No pleural effusion or pneumothorax. Multiple wires and leads project over the right-side of the chest. Clear lungs. Left-sided VP shunt catheter. IMPRESSION: No acute cardiopulmonary disease. Electronically Signed   By: Abigail Miyamoto M.D.   On: 09/19/2018 19:04    Procedures Procedures (including critical  care time)  Medications Ordered in ED Medications  sodium chloride flush (NS) 0.9 % injection 3 mL (3 mLs Intravenous Given 09/19/18 1928)  aspirin tablet 325 mg (325 mg Oral Given 09/19/18 1929)  metoCLOPramide (REGLAN) injection 10 mg (10 mg Intravenous Given 09/19/18 1934)  diphenhydrAMINE (BENADRYL) injection 25 mg (25 mg Intravenous Given 09/19/18 1932)  sodium chloride 0.9 % bolus 1,000 mL (1,000 mLs Intravenous New Bag/Given 09/19/18 1928)  alum & mag hydroxide-simeth (MAALOX/MYLANTA) 200-200-20 MG/5ML suspension 30 mL (30 mLs Oral Given 09/19/18 1930)    And  lidocaine (XYLOCAINE) 2 % viscous mouth solution 15 mL (15 mLs Oral Given 09/19/18 1930)  famotidine (PEPCID) tablet 20 mg (20 mg Oral Given 09/19/18 1930)     Initial Impression / Assessment and Plan / ED Course  I have reviewed the triage vital signs and the nursing notes.  Pertinent labs & imaging results that were available during my care of the patient were reviewed by me and considered in my medical decision making (see chart for details).        James [name] is a 63 y.o. male here with chest pain, epigastric pain. Low suspicion for ACS and he had nl stress test a year ago so will get trop x 2. Consider gastritis vs pancreatitis. Patient had cholecystectomy already. He does have dizziness for the last several weeks and has VP shunt but CT head and shunt series recently were negative and patient has normal neuro exam so will hold off on repeat imaging. Will get labs, lipase, trop x 2, CXR.   10:46 PM Lipase slightly elevated at 88. CT showed no obvious pancreatitis, just cirrhosis. Delta trop neg. Patient felt better after GI cocktail. I wonder if he has gastritis vs gastric ulcer. He is on prilosec. Will add carafate prn. He follows up with Dr. Henrene Pastor from GI and I encouraged him to call office for follow up    Final Clinical Impressions(s) / ED Diagnoses   Final diagnoses:  None    ED Discharge Orders    None        Drenda Freeze, MD 09/19/18 2248

## 2018-09-19 NOTE — Telephone Encounter (Signed)
Pt's wife calling, pt present as well and answering all questions. Pt reports chest "heaviness" 5/10, constant, onset few hours ago, does not radiate. Also reports dizziness, spinning, "Having to hold on to things" to ambulate. Denies any nausea, diaphoresis.BP checked 15 minutes prior to call, 172/107 HR 84. BS 201. Is not on any BP meds. Has not missed any doses of other meds. Also reports headache "All day."  Pt directed to ED, states will follow disposition. Care advise given per protocol.  Reason for Disposition . Dizziness or lightheadedness  Answer Assessment - Initial Assessment Questions 1. LOCATION: "Where does it hurt?"       "Heaviness" center of chest. 5/10 2. RADIATION: "Does the pain go anywhere else?" (e.g., into neck, jaw, arms, back)     no 3. ONSET: "When did the chest pain begin?" (Minutes, hours or days)      Few hours ago 4. PATTERN "Does the pain come and go, or has it been constant since it started?"  "Does it get worse with exertion?"      constant 5. DURATION: "How long does it last" (e.g., seconds, minutes, hours)     constant 6. SEVERITY: "How bad is the pain?"  (e.g., Scale 1-10; mild, moderate, or severe)    - MILD (1-3): doesn't interfere with normal activities     - MODERATE (4-7): interferes with normal activities or awakens from sleep    - SEVERE (8-10): excruciating pain, unable to do any normal activities       5/10 7. CARDIAC RISK FACTORS: "Do you have any history of heart problems or risk factors for heart disease?" (e.g., prior heart attack, angina; high blood pressure, diabetes, being overweight, high cholesterol, smoking, or strong family history of heart disease)     *No Answer* 8. PULMONARY RISK FACTORS: "Do you have any history of lung disease?"  (e.g., blood clots in lung, asthma, emphysema, birth control pills)     *No Answer* 9. CAUSE: "What do you think is causing the chest pain?"     unsure 10. OTHER SYMPTOMS: "Do you have any other  symptoms?" (e.g., dizziness, nausea, vomiting, sweating, fever, difficulty breathing, cough)      Dizziness, headache all day. BP 172/107  HR 84  BS 201  Protocols used: CHEST PAIN-A-AH

## 2018-09-19 NOTE — Discharge Instructions (Addendum)
Continue taking your prilosec.   Add carafate as needed for pain.   Avoid spicy food or fatty foods   See your GI doctor and primary care doctor for follow up   Return to ER if you have worse chest pain, trouble breathing, abdominal pain, vomiting.

## 2018-09-20 ENCOUNTER — Ambulatory Visit: Payer: Self-pay | Admitting: *Deleted

## 2018-09-20 NOTE — Telephone Encounter (Signed)
Called pt regarding the message that he had chest heaviness last evening. He stated that it was in the center of his chest and was annoying. He denied having nausea, sweating, shortness of breath, weakness. But he did have a headache and some dizziness. He was triaged last evening and advised to go to the ED. His b/p was elevated in the ED , 172/110 per pt.  He had an EKG, blood work and CT scan. He was discharged to home. This morning it is 129/81 and HR 87.  He denies any cardiac symptoms. Advise to call 911 for chest pain, nausea, vomiting, headache, shortness of breath, sweating, weakness or just not feel well. Pt voiced understanding. Routing to flow at [facility].

## 2018-09-20 NOTE — Telephone Encounter (Signed)
FYI. Pt went to ED.

## 2018-09-21 MED FILL — SUCRALFATE 1 GM TABLET: 1 | 6 days supply | Qty: 20 | Fill #0

## 2018-09-22 ENCOUNTER — Ambulatory Visit: Payer: 59 | Admitting: Endocrinology

## 2018-09-22 ENCOUNTER — Other Ambulatory Visit: Payer: 59

## 2018-09-26 ENCOUNTER — Ambulatory Visit: Payer: 59 | Admitting: Endocrinology

## 2018-09-29 ENCOUNTER — Encounter: Payer: Self-pay | Admitting: Internal Medicine

## 2018-09-29 ENCOUNTER — Other Ambulatory Visit: Payer: Self-pay

## 2018-09-29 ENCOUNTER — Ambulatory Visit (INDEPENDENT_AMBULATORY_CARE_PROVIDER_SITE_OTHER): Payer: 59 | Admitting: Internal Medicine

## 2018-09-29 DIAGNOSIS — R0789 Other chest pain: Secondary | ICD-10-CM | POA: Diagnosis not present

## 2018-09-29 DIAGNOSIS — K219 Gastro-esophageal reflux disease without esophagitis: Secondary | ICD-10-CM

## 2018-09-29 DIAGNOSIS — K7581 Nonalcoholic steatohepatitis (NASH): Secondary | ICD-10-CM | POA: Diagnosis not present

## 2018-09-29 DIAGNOSIS — K7460 Unspecified cirrhosis of liver: Secondary | ICD-10-CM

## 2018-09-29 NOTE — Progress Notes (Signed)
HISTORY OF PRESENT ILLNESS:  James [name] is a 63 y.o. male with multiple significant medical problems including morbid obesity, diabetes mellitus with neuropathy, hypertension, psoriatic arthritis, and hepatic cirrhosis secondary to NASH.  Patient is followed in this office for his liver disease, GERD, a history of symptomatic cholelithiasis status post cholecystectomy, surveillance colonoscopy program, and a history of diverticulitis.  He was last evaluated in this office Oct 29, 2017.  At that time his MELD score was 10.  His cirrhosis compensated.  He is status post vaccination series.  Last screening upper endoscopy for varices May 2017.  At the time of his last visit he was instructed with regards to weight loss, sodium restriction, and asked to follow-up in 6 months with plans for hepatic imaging for [facility] screening.  Patient follows up at this time after having had a visit to the emergency room September 19, 2018 regarding atypical chest pain.  As possible GI etiology entertained, he was encouraged to contact this office.  Thus, he presents for evaluation of this complaint as well as follow-up on his liver disease.  This encounter is being performed at the initiation of the patient via telemedicine due to the coronavirus pandemic.  Patient describes to me vague chest discomfort over the past 3 to 4 weeks which occurs daily.  He cannot identify any relieving factors or exacerbating factors.  He denies that the discomfort is affected by movements, deep breathing, meals, or other entities.  No nausea or vomiting.  No classic reflux symptoms as he continues to take omeprazole 40 mg twice daily.  No dysphasia though he does have occasional coughing or choking when eating.  Overall he feels like his discomfort is less noticeable and problematic.  He does have a history of musculoskeletal related pain.  He did undergo extensive work-up in the emergency room.  Troponin level was normal.  Comprehensive metabolic panel  was normal except for elevated glucose, BUN, and AST (45)..  Bilirubin was 0.7.  CBC was unremarkable.  Hemoglobin 11.4.  Platelets 164,000.  He did have a mildly elevated lipase at 88.  Contrast-enhanced CT scan of the abdomen and pelvis was performed.  Examination revealed cirrhosis.  No focal lesion.  The pancreas was normal.  He is status post cholecystectomy.  No other abnormalities.  Incidental diverticulosis without diverticulitis.  He was given sucralfate empirically.  No new problems since that visit.  In terms of his liver disease, he has gained 8 pounds since his last visit stating that his current weight is 260 pounds.  He denies problems with edema, change in mental status, or GI bleeding.  His only GI medication is omeprazole.  Patient's last colonoscopy was performed February 2015 with non-adenomatous polyp.  He does tell me that he is having significant problems with back pain for which he is anticipating extensive back surgery.  REVIEW OF SYSTEMS:  All non-GI ROS negative unless otherwise stated in the HPI except for arthritis, headache, anxiety/depression, back pain  Past Medical History:  Diagnosis Date  . Acid reflux   . Arthritis   . Chronic headaches    on cymbalta  . Cirrhosis ([place])   . Colon polyps   . Complication of anesthesia    problems waking up from anesthesia  . Depression    on cymbalta  . Diabetes mellitus with neuropathy ([place])    borderline per primary MD  . Diverticulitis 03/2013  . Eczema   . Elevated LFTs   . Fatty liver   .  History of kidney stones   . Hypertension   . Insomnia 04/26/2013  . Kidney stone   . Morbid obesity ([place])   . Neuromuscular disorder (H[facility])    neuropathy  . Neuropathy   . Post-traumatic hydrocephalus ([facility])    s/p shunts x 2 (first got infected )  . Psoriasis    sees Dr [name]  . Psoriatic arthritis ([place])   . 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 Sleep apnea    no CPAP     .  Stomach ulcer   . Testosterone deficiency 04/28/2011    Past Surgical History:  Procedure Laterality Date  . BACK SURGERY  1980  . CHOLECYSTECTOMY N/A 08/25/2017   Procedure: LAPAROSCOPIC CHOLECYSTECTOMY WITH INTRAOPERATIVE CHOLANGIOGRAM;  Surgeon: [name], MD;  Location: [facility];  Service: General;  Laterality: N/A;  . SHOULDER SURGERY Left 2010  . TOE SURGERY Left 2018  . TOTAL HIP ARTHROPLASTY Left 2011  . VENTRICULOPERITONEAL SHUNT  2007   x2    Social History James [name]  reports that he has never smoked. He has never used smokeless tobacco. He reports that he does not drink alcohol or use drugs.  family history includes Down syndrome in his son; Healthy in his mother; Heart disease in his brother; Lung cancer in his father; Other in his brother.  Allergies  Allergen Reactions  . Bee Venom Swelling  . Hydrocodone-Homatropine Other (See Comments)    Depressed feeling  . Morphine And Related Other (See Comments)    Hallucinations, back in the 80s. States has taken vicodin before w/o problems   . Sulfa Drugs Cross Reactors Rash       PHYSICAL EXAMINATION: This was a telemedicine visit (due to the coronavirus pandemic).  Thus, no physical examination   ASSESSMENT:  1.  NASH cirrhosis.  Compensated.  Stable MELD score at 10 2.  Morbid obesity.  Ongoing 3.  Atypical chest pain.  There is no evidence that this is GI in origin based on history, recent physical examination in the emergency room, and extensive work-up. 4.  Trivial elevation of lipase of uncertain clinical significance.  Seriously doubt related to his complaints.  CT the pancreas normal. 5.  GERD.  Ongoing.  Classic symptoms controlled with PPI 6.  Status post cholecystectomy 7.  History of diverticulitis 8.  Colonoscopy 2015.  Negative for neoplasia 9.  Status post vaccination series 10.Screening upper endoscopy May 2017- for varices 11.  Multiple significant medical problems  PLAN:  1.   Reassurance from a GI standpoint with regards to atypical chest pain.  If he has further problems, he should contact his PCP to evaluate for non-GI causes. 2.  Reflux precautions 3.  Weight loss 4.  Continue to engage in low-sodium diet 5.  Patient will need upper endoscopy to screen for varices.  Given the viral pandemic, we will tentatively plan this for about 6 months and place recall in the system.  I will ask my [place] to assist with this directive. 6.  Interval GI follow-up as needed 7.  Repeat hepatic imaging for HCC screening in 6 to 12 months  This telemedicine visit was initiated by the patient.  The patient was in his home during the encounter and I was in my office.  Patient consented to this visit and understands there may be associated charges.  The encounter lasted approximately 25 minutes.

## 2018-09-29 NOTE — Patient Instructions (Addendum)
If you have further problems with atypical chest pain, you should contact your primary care provider to evaluate for non-GI causes.  Please follow reflux precautions as recommended.  Work on weight loss.  Continue to engage in a low-salt diet (2 grams or less daily)  You will need an upper endoscopy to screen for varices.  Given the viral pandemic, we will tentatively plan this for about 6 months. You will get a letter in the mail when it gets closer to that time.   Please schedule interval GI follow-up as needed.  Repeat liver imaging for [place] screening in 6 to 12 months.   Gastroesophageal Reflux Disease, Adult Gastroesophageal reflux (GER) happens when acid from the stomach flows up into the tube that connects the mouth and the stomach (esophagus). Normally, food travels down the esophagus and stays in the stomach to be digested. With GER, food and stomach acid sometimes move back up into the esophagus. You may have a disease called gastroesophageal reflux disease (GERD) if the reflux:  Happens often.  Causes frequent or very bad symptoms.  Causes problems such as damage to the esophagus. When this happens, the esophagus becomes sore and swollen (inflamed). Over time, GERD can make small holes (ulcers) in the lining of the esophagus. What are the causes? This condition is caused by a problem with the muscle between the esophagus and the stomach. When this muscle is weak or not normal, it does not close properly to keep food and acid from coming back up from the stomach. The muscle can be weak because of:  Tobacco use.  Pregnancy.  Having a certain type of hernia (hiatal hernia).  Alcohol use.  Certain foods and drinks, such as coffee, chocolate, onions, and peppermint. What increases the risk? You are more likely to develop this condition if you:  Are overweight.  Have a disease that affects your connective tissue.  Use NSAID medicines. What are the signs or  symptoms? Symptoms of this condition include:  Heartburn.  Difficult or painful swallowing.  The feeling of having a lump in the throat.  A bitter taste in the mouth.  Bad breath.  Having a lot of saliva.  Having an upset or bloated stomach.  Belching.  Chest pain. Different conditions can cause chest pain. Make sure you see your doctor if you have chest pain.  Shortness of breath or noisy breathing (wheezing).  Ongoing (chronic) cough or a cough at night.  Wearing away of the surface of teeth (tooth enamel).  Weight loss. How is this treated? Treatment will depend on how bad your symptoms are. Your doctor may suggest:  Changes to your diet.  Medicine.  Surgery. Follow these instructions at home: Eating and drinking   Follow a diet as told by your doctor. You may need to avoid foods and drinks such as: ? Coffee and tea (with or without caffeine). ? Drinks that contain alcohol. ? Energy drinks and sports drinks. ? Bubbly (carbonated) drinks or sodas. ? Chocolate and cocoa. ? Peppermint and mint flavorings. ? Garlic and onions. ? Horseradish. ? Spicy and acidic foods. These include peppers, chili powder, curry powder, vinegar, hot sauces, and BBQ sauce. ? Citrus fruit juices and citrus fruits, such as oranges, lemons, and limes. ? Tomato-based foods. These include red sauce, chili, salsa, and pizza with red sauce. ? Fried and fatty foods. These include donuts, french fries, potato chips, and high-fat dressings. ? High-fat meats. These include hot dogs, rib eye steak, sausage, ham, and  bacon. ? High-fat dairy items, such as whole milk, butter, and cream cheese.  Eat small meals often. Avoid eating large meals.  Avoid drinking large amounts of liquid with your meals.  Avoid eating meals during the 2-3 hours before bedtime.  Avoid lying down right after you eat.  Do not exercise right after you eat. Lifestyle   Do not use any products that contain  nicotine or tobacco. These include cigarettes, e-cigarettes, and chewing tobacco. If you need help quitting, ask your doctor.  Try to lower your stress. If you need help doing this, ask your doctor.  If you are overweight, lose an amount of weight that is healthy for you. Ask your doctor about a safe weight loss goal. General instructions  Pay attention to any changes in your symptoms.  Take over-the-counter and prescription medicines only as told by your doctor. Do not take aspirin, ibuprofen, or other NSAIDs unless your doctor says it is okay.  Wear loose clothes. Do not wear anything tight around your waist.  Raise (elevate) the head of your bed about 6 inches (15 cm).  Avoid bending over if this makes your symptoms worse.  Keep all follow-up visits as told by your doctor. This is important. Contact a doctor if:  You have new symptoms.  You lose weight and you do not know why.  You have trouble swallowing or it hurts to swallow.  You have wheezing or a cough that keeps happening.  Your symptoms do not get better with treatment.  You have a hoarse voice. Get help right away if:  You have pain in your arms, neck, jaw, teeth, or back.  You feel sweaty, dizzy, or light-headed.  You have chest pain or shortness of breath.  You throw up (vomit) and your throw-up looks like blood or coffee grounds.  You pass out (faint).  Your poop (stool) is bloody or black.  You cannot swallow, drink, or eat. Summary  If a person has gastroesophageal reflux disease (GERD), food and stomach acid move back up into the esophagus and cause symptoms or problems such as damage to the esophagus.  Treatment will depend on how bad your symptoms are.  Follow a diet as told by your doctor.  Take all medicines only as told by your doctor. This information is not intended to replace advice given to you by your health care provider. Make sure you discuss any questions you have with your health  care provider. Document Released: 12/02/2007 Document Revised: 12/22/2017 Document Reviewed: 12/22/2017 Elsevier Interactive Patient Education  2019 [employer].

## 2018-09-30 ENCOUNTER — Ambulatory Visit: Payer: 59 | Admitting: Internal Medicine

## 2018-10-04 ENCOUNTER — Other Ambulatory Visit: Payer: Self-pay | Admitting: Neurology

## 2018-10-04 MED FILL — levETIRAcetam 750 MG TABS: 750 | 30 days supply | Qty: 60 | Fill #0

## 2018-10-05 DIAGNOSIS — M4316 Spondylolisthesis, lumbar region: Secondary | ICD-10-CM | POA: Diagnosis not present

## 2018-10-05 DIAGNOSIS — M5416 Radiculopathy, lumbar region: Secondary | ICD-10-CM | POA: Diagnosis not present

## 2018-10-05 DIAGNOSIS — K7581 Nonalcoholic steatohepatitis (NASH): Secondary | ICD-10-CM

## 2018-10-05 DIAGNOSIS — M545 Low back pain: Secondary | ICD-10-CM | POA: Diagnosis not present

## 2018-10-05 DIAGNOSIS — D1779 Benign lipomatous neoplasm of other sites: Secondary | ICD-10-CM

## 2018-10-05 DIAGNOSIS — E882 Lipomatosis, not elsewhere classified: Secondary | ICD-10-CM | POA: Insufficient documentation

## 2018-10-05 DIAGNOSIS — G911 Obstructive hydrocephalus: Secondary | ICD-10-CM | POA: Diagnosis not present

## 2018-10-05 HISTORY — DX: Benign lipomatous neoplasm of other sites: D17.79

## 2018-10-05 HISTORY — DX: Lipomatosis, not elsewhere classified: E88.2

## 2018-10-05 HISTORY — DX: Nonalcoholic steatohepatitis (NASH): K75.81

## 2018-10-10 ENCOUNTER — Ambulatory Visit (INDEPENDENT_AMBULATORY_CARE_PROVIDER_SITE_OTHER): Payer: 59 | Admitting: Specialist

## 2018-10-13 ENCOUNTER — Telehealth: Payer: Self-pay | Admitting: Neurology

## 2018-10-13 NOTE — Telephone Encounter (Signed)
Pt consented to a Virtual Visit and for the insurance to be billed as such. E-mail was confirmed but may call back to change it once wife gets home.

## 2018-10-13 NOTE — Telephone Encounter (Signed)
Noted  

## 2018-10-13 NOTE — Telephone Encounter (Signed)
Katie or Raquel Sarna- can you get pt set up for virtual visit since he consented per Jael? Thank you!

## 2018-10-13 NOTE — Telephone Encounter (Signed)
Set up virtual visit and sent email.

## 2018-10-19 NOTE — Telephone Encounter (Signed)
Called, LVM for pt to call office back so I can update med list/pharmacy/allergies on file prior to VV on Monday with Dr. Felecia Shelling.

## 2018-10-20 NOTE — Telephone Encounter (Signed)
I called pt. Pt's meds, allergies, and PMH were updated.  Pt does not have any questions prior to his VV on Monday. Pt understands that he will get a call 30 mins prior to the appt on Monday for a check in.

## 2018-10-21 MED FILL — TRULICITY 1.5 MG/0.5 ML PEN: 1.5 | 28 days supply | Qty: 2 | Fill #2

## 2018-10-21 MED FILL — DULOXETINE HCL 60 MG CPEP: 60 | 30 days supply | Qty: 60 | Fill #3

## 2018-10-24 ENCOUNTER — Encounter: Payer: Self-pay | Admitting: Neurology

## 2018-10-24 ENCOUNTER — Telehealth: Payer: Self-pay | Admitting: *Deleted

## 2018-10-24 ENCOUNTER — Ambulatory Visit (INDEPENDENT_AMBULATORY_CARE_PROVIDER_SITE_OTHER): Payer: 59 | Admitting: Neurology

## 2018-10-24 ENCOUNTER — Other Ambulatory Visit: Payer: Self-pay

## 2018-10-24 ENCOUNTER — Telehealth: Payer: Self-pay | Admitting: Neurology

## 2018-10-24 DIAGNOSIS — G43709 Chronic migraine without aura, not intractable, without status migrainosus: Secondary | ICD-10-CM

## 2018-10-24 DIAGNOSIS — G4733 Obstructive sleep apnea (adult) (pediatric): Secondary | ICD-10-CM | POA: Diagnosis not present

## 2018-10-24 DIAGNOSIS — Z982 Presence of cerebrospinal fluid drainage device: Secondary | ICD-10-CM | POA: Diagnosis not present

## 2018-10-24 DIAGNOSIS — G911 Obstructive hydrocephalus: Secondary | ICD-10-CM

## 2018-10-24 DIAGNOSIS — IMO0002 Reserved for concepts with insufficient information to code with codable children: Secondary | ICD-10-CM

## 2018-10-24 HISTORY — DX: Chronic migraine without aura, not intractable, without status migrainosus: G43.709

## 2018-10-24 MED ORDER — GALCANEZUMAB-GNLM 120 MG/ML ~~LOC~~ SOAJ: 1.0000 "pen " | SUBCUTANEOUS | 4 refills | Status: DC

## 2018-10-24 NOTE — Telephone Encounter (Signed)
Took call from phone staff. Pt never received email for VV. I resent but pt appt at 830am and past appt time. I placed pt on hold, contacted Dr. Felecia Shelling. Dr. Felecia Shelling will call and convert to telephone visit instead.

## 2018-10-24 NOTE — Telephone Encounter (Signed)
Submitted PA emgality on CMM. Key: [id number]. Waiting on determination.
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
## 2018-10-24 NOTE — Telephone Encounter (Signed)
10/24/18 - LVM to schedule 4 mo f/u

## 2018-10-24 NOTE — Telephone Encounter (Signed)
-----   Message from Britt Bottom, MD sent at 10/24/2018  9:00 AM EDT ----- Please schedule follow-up in 4 months

## 2018-10-26 MED FILL — HUMIRA PEN 40 MG/0.8ML PNKT: 40 | 28 days supply | Qty: 2 | Fill #1

## 2018-10-26 MED FILL — EMGALITY 120 MG/ML SOAJ: 120 | 84 days supply | Qty: 3 | Fill #0

## 2018-10-26 MED FILL — EPINEPHRINE 0.3 MG AUTO-INJ: 0.3 | 2 days supply | Qty: 2 | Fill #1

## 2018-10-26 NOTE — Telephone Encounter (Addendum)
Received fax notification from [employer] that [place] approved 10/25/18-11/23/18 for 1 fill. PA reference number: [id number]. Also approved 11/17/18-04/18/19 for 5 fills. Ref Josem Kaufmann #[id number].   Faxed notification to [facility] at [phone]. Received fax confirmation.

## 2018-10-31 MED FILL — GLIMEPIRIDE 1 MG TABLET: 1 | 30 days supply | Qty: 30 | Fill #3

## 2018-11-07 DIAGNOSIS — Z760 Encounter for issue of repeat prescription: Secondary | ICD-10-CM | POA: Diagnosis not present

## 2018-11-07 MED FILL — ATORVASTATIN 10 MG TABLET: 10 | 30 days supply | Qty: 30 | Fill #2

## 2018-11-07 MED FILL — GABAPENTIN 600 MG TABS: 600 | 30 days supply | Qty: 90 | Fill #5

## 2018-11-07 MED FILL — levETIRAcetam 750 MG TABS: 750 | 30 days supply | Qty: 60 | Fill #1

## 2018-11-09 ENCOUNTER — Encounter: Payer: Self-pay | Admitting: Internal Medicine

## 2018-11-09 ENCOUNTER — Ambulatory Visit: Payer: 59

## 2018-11-09 ENCOUNTER — Other Ambulatory Visit: Payer: Self-pay

## 2018-11-09 VITALS — Ht 71.0 in | Wt 260.0 lb

## 2018-11-09 DIAGNOSIS — K7460 Unspecified cirrhosis of liver: Secondary | ICD-10-CM

## 2018-11-09 NOTE — Progress Notes (Signed)
Per pt, no allergies to soy or egg products.Pt not taking any weight loss meds or using  O2 at home. Pt states he is hard to wake up past sedation!  The PV was done over the phone due to COVID-19. I verified the pt's address and insurance with him. I reviewed the prep instructions and paperwork  with the pt and will mail the paperwork to the pt today. The pt was advised to call our office if he has further questions or has any changes prior to his procedure. Pt understood.

## 2018-11-14 ENCOUNTER — Telehealth: Payer: Self-pay | Admitting: Nurse Practitioner

## 2018-11-14 NOTE — Telephone Encounter (Signed)
Covid-19 travel screening questions  Have you traveled in the last 14 days?no If yes where?  Do you now or have you had a fever in the last 14 days?no   Do you have any respiratory symptoms of shortness of breath or cough now or in the last 14 days? no Do you have a medical history of Congestive Heart Failure?  Do you have a medical history of lung disease?  Do you have any family members or close contacts with diagnosed or suspected Covid-19?no   Pt aware of care partner policy and the need to bring a mask in.  Pt agreed

## 2018-11-14 NOTE — Telephone Encounter (Signed)
Phone call to patient to verify medication list and allergies for myelogram procedure. Pt instructed to hold Cymbalta for 48hrs prior to myelogram appointment time. Pt verbalized understanding.

## 2018-11-16 ENCOUNTER — Ambulatory Visit (AMBULATORY_SURGERY_CENTER): Payer: 59 | Admitting: Internal Medicine

## 2018-11-16 ENCOUNTER — Encounter: Payer: Self-pay | Admitting: Internal Medicine

## 2018-11-16 ENCOUNTER — Other Ambulatory Visit: Payer: Self-pay

## 2018-11-16 VITALS — BP 138/91 | HR 76 | Temp 99.2°F | Resp 19 | Ht 71.0 in | Wt 260.0 lb

## 2018-11-16 DIAGNOSIS — K7460 Unspecified cirrhosis of liver: Secondary | ICD-10-CM | POA: Diagnosis not present

## 2018-11-16 DIAGNOSIS — K317 Polyp of stomach and duodenum: Secondary | ICD-10-CM

## 2018-11-16 DIAGNOSIS — K3189 Other diseases of stomach and duodenum: Secondary | ICD-10-CM

## 2018-11-16 MED ORDER — SODIUM CHLORIDE 0.9 % IV SOLN: 500.0000 mL | Freq: Once | INTRAVENOUS | Status: DC

## 2018-11-16 NOTE — Op Note (Signed)
[facility] Patient Name: James [name] Procedure Date: 11/16/2018 10:11 AM MRN: [id number] Endoscopist: Docia Chuck. Henrene Pastor , MD Age: 63 Referring MD:  Date of Birth: 23-May-1956 Gender: Male Account #: [id number] Procedure:                Upper GI endoscopy with biopsy Indications:              Abdominal pain, Cirrhosis rule out esophageal                            varices Medicines:                Monitored Anesthesia Care Procedure:                Pre-Anesthesia Assessment:                           - Prior to the procedure, a History and Physical                            was performed, and patient medications and                            allergies were reviewed. The patient's tolerance of                            previous anesthesia was also reviewed. The risks                            and benefits of the procedure and the sedation                            options and risks were discussed with the patient.                            All questions were answered, and informed consent                            was obtained. Prior Anticoagulants: The patient has                            taken no previous anticoagulant or antiplatelet                            agents. ASA Grade Assessment: III - A patient with                            severe systemic disease. After reviewing the risks                            and benefits, the patient was deemed in                            satisfactory condition to undergo the procedure.  After obtaining informed consent, the endoscope was                            passed under direct vision. Throughout the                            procedure, the patient's blood pressure, pulse, and                            oxygen saturations were monitored continuously. The                            Model GIF-HQ190 [id number]) scope was introduced                            through the mouth, and advanced to the  second part                            of duodenum. The upper GI endoscopy was                            accomplished without difficulty. The patient                            tolerated the procedure well. Scope In: Scope Out: Findings:                 The esophagus was normal. No varices.                           The stomach revealed mild portal hypertensive                            gastropathy and mild changes in the antrum                            consistent with GAVE. Also isolated 12 mm                            inflammatory appearing polyp in the proximal                            gastric body. Biopsies were taken with a cold                            forceps for histology.                           The examined duodenum was normal.                           The cardia and gastric fundus were normal on                            retroflexion. Complications:  No immediate complications. Estimated Blood Loss:     Estimated blood loss: none. Impression:               1. Incidental gastric polyp status post biopsy                           2. Mild changes of portal gastropathy                           3. Otherwise normal exam. NO VARICES. Recommendation:           - Patient has a contact number available for                            emergencies. The signs and symptoms of potential                            delayed complications were discussed with the                            patient. Return to normal activities tomorrow.                            Written discharge instructions were provided to the                            patient.                           - Resume previous diet.                           - Continue present medications.                           - Await pathology results.                           - Routine office follow-up 1 year                           - Repeat EGD 2 to 3 years Io Dieujuste N. Henrene Pastor, MD 11/16/2018 10:56:06 AM This report  has been signed electronically.

## 2018-11-16 NOTE — Progress Notes (Signed)
PT taken to PACU. Monitors in place. VSS. Report given to RN. 

## 2018-11-16 NOTE — Progress Notes (Signed)
Dr. Henrene Pastor aware of patient blood sugar. No new order.

## 2018-11-16 NOTE — Progress Notes (Signed)
Called to room to assist during endoscopic procedure.  Patient ID and intended procedure confirmed with present staff. Received instructions for my participation in the procedure from the performing physician.  

## 2018-11-16 NOTE — Progress Notes (Signed)
Pt's states no medical or surgical changes since previsit or office visit. Courtney Washington-Temps,Judy Branson-vital signs.

## 2018-11-16 NOTE — Patient Instructions (Addendum)
Follow up in 1 year and repeat EGD in 2-3 years.   YOU HAD AN ENDOSCOPIC PROCEDURE TODAY AT [facility]:   Refer to the procedure report that was given to you for any specific questions about what was found during the examination.  If the procedure report does not answer your questions, please call your gastroenterologist to clarify.  If you requested that your care partner not be given the details of your procedure findings, then the procedure report has been included in a sealed envelope for you to review at your convenience later.  YOU SHOULD EXPECT: Some feelings of bloating in the abdomen. Passage of more gas than usual.  Walking can help get rid of the air that was put into your GI tract during the procedure and reduce the bloating. If you had a lower endoscopy (such as a colonoscopy or flexible sigmoidoscopy) you may notice spotting of blood in your stool or on the toilet paper. If you underwent a bowel prep for your procedure, you may not have a normal bowel movement for a few days.  Please Note:  You might notice some irritation and congestion in your nose or some drainage.  This is from the oxygen used during your procedure.  There is no need for concern and it should clear up in a day or so.  SYMPTOMS TO REPORT IMMEDIATELY:   Following upper endoscopy (EGD)  Vomiting of blood or coffee ground material  New chest pain or pain under the shoulder blades  Painful or persistently difficult swallowing  New shortness of breath  Fever of 100F or higher  Black, tarry-looking stools  For urgent or emergent issues, a gastroenterologist can be reached at any hour by calling [phone].   DIET:  We do recommend a small meal at first, but then you may proceed to your regular diet.  Drink plenty of fluids but you should avoid alcoholic beverages for 24 hours.  ACTIVITY:  You should plan to take it easy for the rest of today and you should NOT DRIVE or use heavy machinery  until tomorrow (because of the sedation medicines used during the test).    FOLLOW UP: Our staff will call the number listed on your records 48-72 hours following your procedure to check on you and address any questions or concerns that you may have regarding the information given to you following your procedure. If we do not reach you, we will leave a message.  We will attempt to reach you two times.  During this call, we will ask if you have developed any symptoms of COVID 19. If you develop any symptoms (for example fever, flu-like symptoms, shortness of breath, cough etc.) before then, please call [phone].  If any biopsies were taken you will be contacted by phone or by letter within the next 1-3 weeks.  Please call us at [phone] if you have not heard about the biopsies in 3 weeks.    SIGNATURES/CONFIDENTIALITY: You and/or your care partner have signed paperwork which will be entered into your electronic medical record.  These signatures attest to the fact that that the information above on your After Visit Summary has been reviewed and is understood.  Full responsibility of the confidentiality of this discharge information lies with you and/or your care-partner.

## 2018-11-18 ENCOUNTER — Telehealth: Payer: Self-pay | Admitting: *Deleted

## 2018-11-18 NOTE — Telephone Encounter (Signed)
  Follow up Call-  Call back number 11/16/2018  Post procedure Call Back phone  # [phone]  Permission to leave phone message Yes  Some recent data might be hidden     Patient questions:  Do you have a fever, pain , or abdominal swelling? No. Pain Score  0 *  Have you tolerated food without any problems? Yes.    Have you been able to return to your normal activities? Yes.    Do you have any questions about your discharge instructions: Diet   No. Medications  No. Follow up visit  No.  Do you have questions or concerns about your Care? No.  Actions: * If pain score is 4 or above: No action needed, pain <4.

## 2018-11-22 ENCOUNTER — Other Ambulatory Visit: Payer: Self-pay

## 2018-11-22 ENCOUNTER — Encounter: Payer: Self-pay | Admitting: Internal Medicine

## 2018-11-22 ENCOUNTER — Other Ambulatory Visit: Payer: Self-pay | Admitting: Endocrinology

## 2018-11-22 ENCOUNTER — Ambulatory Visit (INDEPENDENT_AMBULATORY_CARE_PROVIDER_SITE_OTHER): Payer: 59 | Admitting: Internal Medicine

## 2018-11-22 DIAGNOSIS — E1122 Type 2 diabetes mellitus with diabetic chronic kidney disease: Secondary | ICD-10-CM

## 2018-11-22 DIAGNOSIS — N182 Chronic kidney disease, stage 2 (mild): Secondary | ICD-10-CM

## 2018-11-22 DIAGNOSIS — D649 Anemia, unspecified: Secondary | ICD-10-CM | POA: Diagnosis not present

## 2018-11-22 DIAGNOSIS — K7460 Unspecified cirrhosis of liver: Secondary | ICD-10-CM | POA: Diagnosis not present

## 2018-11-22 MED FILL — DULOXETINE HCL 60 MG CPEP: 60 | 30 days supply | Qty: 60 | Fill #4

## 2018-11-22 NOTE — Progress Notes (Signed)
Subjective:    Patient ID: James [name], male    DOB: Sep 18, 1955, 63 y.o.   MRN: [id number]  DOS:  11/22/2018 Type of visit - description: Virtual Visit via Video Note  I connected with@ on 11/23/18 at  9:00 AM EDT by a video enabled telemedicine application and verified that I am speaking with the correct person using two identifiers.   THIS ENCOUNTER IS A VIRTUAL VISIT DUE TO COVID-19 - PATIENT WAS NOT SEEN IN THE OFFICE. PATIENT HAS CONSENTED TO VIRTUAL VISIT / TELEMEDICINE VISIT   Location of patient: home  Location of provider: office  I discussed the limitations of evaluation and management by telemedicine and the availability of in person appointments. The patient expressed understanding and agreed to proceed.  History of Present Illness: Routine  follow-up Headaches: Under the care of neurology, improving to some extent. Cirrhosis: Had a recent EGD, report reviewed HTN: Good medication compliance, no ambulatory BPs but reports they are okay when checked. Coronavirus: Good compliance with sheltering. Anemia: Chart reviewed   BP Readings from Last 3 Encounters:  11/16/18 (!) 138/91  09/19/18 (!) 143/81  09/08/18 123/82   Review of Systems Denies fever chills No nausea, vomiting, diarrhea. No cough  Past Medical History:  Diagnosis Date  . Acid reflux   . Arthritis   . Chronic headaches    on cymbalta  . Cirrhosis ([place])   . Colon polyps   . Complication of anesthesia    problems waking up from anesthesia  . Depression    on cymbalta  . Diabetes mellitus with neuropathy ([place])    borderline per primary MD  . Diverticulitis 03/2013  . Eczema   . Elevated LFTs   . Fatty liver   . History of kidney stones   . Hypertension   . Insomnia 04/26/2013  . Kidney stone   . Morbid obesity ([place])   . Neuromuscular disorder ([facility])    neuropathy  . Neuropathy   . Post-traumatic hydrocephalus ([facility])    s/p shunts x 2 (first got infected )  . Psoriasis    sees Dr  [name]  . Psoriatic arthritis ([place])   . 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 Sleep apnea    no CPAP     . Stomach ulcer   . Testosterone deficiency 04/28/2011    Past Surgical History:  Procedure Laterality Date  . BACK SURGERY  1980  . CHOLECYSTECTOMY N/A 08/25/2017   Procedure: LAPAROSCOPIC CHOLECYSTECTOMY WITH INTRAOPERATIVE CHOLANGIOGRAM;  Surgeon: [name], MD;  Location: [facility];  Service: General;  Laterality: N/A;  . COLONOSCOPY    . SHOULDER SURGERY Left 2010  . TOE SURGERY Left 2018  . TOTAL HIP ARTHROPLASTY Left 2011  . VENTRICULOPERITONEAL SHUNT  2007   x2    Social History   Socioeconomic History  . Marital status: Married    Spouse name: [name]  . Number of children: 2  . Years of education: Not on file  . Highest education level: Not on file  Occupational History  . Occupation: disable   Social Needs  . Financial resource strain: Not on file  . Food insecurity:    Worry: Not on file    Inability: Not on file  . Transportation needs:    Medical: Not on file    Non-medical: Not on file  Tobacco Use  . Smoking status: Never Smoker  . Smokeless tobacco: Never Used  Substance and Sexual Activity  .  Alcohol use: No  . Drug use: No  . Sexual activity: Yes    Partners: Female  Lifestyle  . Physical activity:    Days per week: Not on file    Minutes per session: Not on file  . Stress: Not on file  Relationships  . Social connections:    Talks on phone: Not on file    Gets together: Not on file    Attends religious service: Not on file    Active member of club or organization: Not on file    Attends meetings of clubs or organizations: Not on file    Relationship status: Not on file  . Intimate partner violence:    Fear of current or ex partner: Not on file    Emotionally abused: Not on file    Physically abused: Not on file    Forced sexual activity: Not on file  Other Topics Concern  . 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      Allergies as of 11/22/2018      Reactions   Bee Venom Swelling   Hydrocodone-homatropine Other (See Comments)   Depressed feeling   Morphine And Related Other (See Comments)   Hallucinations, back in the 80s. States has taken vicodin before w/o problems    Sulfa Drugs Cross Reactors Rash      Medication List       Accurate as of Nov 22, 2018 11:59 PM. If you have any questions, ask your nurse or doctor.        Adalimumab 40 MG/0.8ML Pnkt Commonly known as:  Humira Pen Inject 0.8 mLs into the skin every 14 (fourteen) days. Inject 40 mg (0.8 ml) under the skin every other week.   atorvastatin 10 MG tablet Commonly known as:  LIPITOR TAKE 1 TABLET (10 MG TOTAL) BY MOUTH DAILY.   carvedilol 12.5 MG tablet Commonly known as:  COREG Take 1 tablet (12.5 mg total) by mouth 2 (two) times daily with a meal.   DULoxetine 60 MG capsule Commonly known as:  CYMBALTA Take 2 capsules (120 mg total) by mouth daily.   EPINEPHrine 0.3 mg/0.3 mL Soaj injection Commonly known as:  EpiPen 2-Pak Inject 0.3 mLs (0.3 mg total) into the muscle once as needed for up to 1 dose.   fenofibrate micronized 134 MG capsule Commonly known as:  LOFIBRA TAKE 1 CAPSULE (134 MG TOTAL) BY MOUTH DAILY BEFORE BREAKFAST.   FreeStyle Freedom Lite w/Device Kit Use to check blood sugars 3 times daily. Dx code: E11.9   freestyle lancets Use to check blood sugar 3 times daily. Dx code E11.9   gabapentin 600 MG tablet Commonly known as:  NEURONTIN Take 1 tablet (600 mg total) by mouth 2 (two) times daily.   Galcanezumab-gnlm 120 MG/ML Soaj Commonly known as:  Emgality Inject 1 pen into the skin every 28 (twenty-eight) days.   glimepiride 1 MG tablet Commonly known as:  AMARYL TAKE 1 TABLET (1 MG TOTAL) BY  MOUTH DAILY BEFORE SUPPER.   glucose blood test strip Commonly known as:  Accu-Chek Guide Use to test blood sugar three times daily   levETIRAcetam 750 MG tablet Commonly known as:  KEPPRA TAKE 1 TABLET (750 MG TOTAL) BY MOUTH 2 (TWO) TIMES DAILY.   metFORMIN 1000 MG tablet Commonly known as:  GLUCOPHAGE TAKE 1 TABLET BY MOUTH TWICE DAILY WITH A MEAL   naproxen sodium 220 MG tablet Commonly known as:  ALEVE Take 220 mg by mouth as needed.   omeprazole 40 MG capsule Commonly known as:  PRILOSEC Take 1 capsule (40 mg total) by mouth 2 (two) times daily before a meal.   triamcinolone cream 0.1 % Commonly known as:  KENALOG Apply 1 application topically as needed.   Trulicity 1.5 UL/8.4TX Sopn Generic drug:  Dulaglutide Inject 1.5 mg into the skin once a week.           Objective:   Physical Exam There were no vitals taken for this visit. This is a virtual video visit, patient is alert oriented x3, no apparent distress    Assessment     Assessment  DM - Dr [name] Neuropathy (x years, rx gaba 05-2014, w/u 11-2014  RPR neg, vit D-B12-Folic Acid wnl ); Saw Dr Posey Pronto, [place] 1. The electrophysiologic findings are most consistent with a chronic, distal and symmetric sensorimotor polyneuropathy, predominantly axon loss in type, affecting the lower extremities. 2. There is no evidence of a superimposed lumbosacral radiculopathy. CRI HTN Hyperlipidemia (TG in the 500s 2016) OSA , dx 2012, sleep study again 02-2015 Dr Dohmeier--> severe OSA, intolerant to CPAP Depression, insomnia ----on Cymbalta NEURO: --Chronic headaches -----on Cymbalta --Posttraumatic hydrocephalus s/p 2 shunts (first got infected) MSK: on disability d/t back pain- HAs Hypogonadism  Dx 2012, normal T 11-2014 (on no RX) GI:  --GERD, diverticulitis 2014, h/o PUD -- NASH with cirrhosis per GI note 10/2017, s/p Hep A/B shots Psoriasis, psoriatic arthritis -- on HUMIRA  H/o urolithiasis +FH CAD brother  MI age 67  PLAN DM: Well-controlled, seen by endocrinology Neuropathy: Gabapentin dose decreased to twice a day according to the patient CRI: Last BMP stable HTN: No ambulatory BPs, on carvedilol.  Per chart review BPs trending up (?).  Will recheck on RTC Cirrhosis: Had a EGD 10/2018, screening varices negative, had a gastric polyp (MI:WOEHOZY inflammatory polyp with ulceration.  No H. pylori, no metaplasia or dysplasia.) Back pain: Last neurosurgery visit reviewed: Was recommended a myelogram of the lumbar and thoracic spine (pending)  Anemia:  Low iron noted 07-2018, last hemoglobin 11.4 on March 2020.  H/o wnl  [id number] folic acid   Had a EGD 10/2018. Last colonoscopy 07-2013, 1 polyp, next 5 years. Plan: We will get a CBC, iron, ferritin, reticulocyte count.  Will need further GI eval depending on results RTC 3 months CPX  I discussed the assessment and treatment plan with the patient. The patient was provided an opportunity to ask questions and all were answered. The patient agreed with the plan and demonstrated an understanding of the instructions.   The patient was advised to call back or seek an in-person evaluation if the symptoms worsen or if the condition fails to improve as anticipated.

## 2018-11-23 ENCOUNTER — Telehealth: Payer: Self-pay | Admitting: Internal Medicine

## 2018-11-23 NOTE — Telephone Encounter (Signed)
3 MONTH CPE & LAB IN FEW DAYS.Marland Kitchen LEFT MSG FOR PT TO CALLBACK

## 2018-11-23 NOTE — Assessment & Plan Note (Signed)
DM: Well-controlled, seen by endocrinology Neuropathy: Gabapentin dose decreased to twice a day according to the patient CRI: Last BMP stable HTN: No ambulatory BPs, on carvedilol.  Per chart review BPs trending up (?).  Will recheck on RTC Cirrhosis: Had a EGD 10/2018, screening varices negative, had a gastric polyp (GN:FAOZHYQ inflammatory polyp with ulceration.  No H. pylori, no metaplasia or dysplasia.) Back pain: Last neurosurgery visit reviewed: Was recommended a myelogram of the lumbar and thoracic spine (pending)  Anemia:  Low iron noted 07-2018, last hemoglobin 11.4 on March 2020.  H/o wnl  [id number] folic acid   Had a EGD 10/2018. Last colonoscopy 07-2013, 1 polyp, next 5 years. Plan: We will get a CBC, iron, ferritin, reticulocyte count.  Will need further GI eval depending on results RTC 3 months CPX

## 2018-11-28 ENCOUNTER — Other Ambulatory Visit (INDEPENDENT_AMBULATORY_CARE_PROVIDER_SITE_OTHER): Payer: 59

## 2018-11-28 ENCOUNTER — Other Ambulatory Visit: Payer: Self-pay

## 2018-11-28 DIAGNOSIS — D649 Anemia, unspecified: Secondary | ICD-10-CM | POA: Diagnosis not present

## 2018-11-28 LAB — CBC WITH DIFFERENTIAL/PLATELET
Basophils Absolute: 0 10*3/uL (ref 0.0–0.1)
Basophils Relative: 0.7 % (ref 0.0–3.0)
Eosinophils Absolute: 0.2 10*3/uL (ref 0.0–0.7)
Eosinophils Relative: 4.5 % (ref 0.0–5.0)
HCT: 35.7 % — ABNORMAL LOW (ref 39.0–52.0)
Hemoglobin: 12 g/dL — ABNORMAL LOW (ref 13.0–17.0)
Lymphocytes Relative: 32.9 % (ref 12.0–46.0)
Lymphs Abs: 1.7 10*3/uL (ref 0.7–4.0)
MCHC: 33.5 g/dL (ref 30.0–36.0)
MCV: 84.5 fl (ref 78.0–100.0)
Monocytes Absolute: 0.4 10*3/uL (ref 0.1–1.0)
Monocytes Relative: 8.2 % (ref 3.0–12.0)
Neutro Abs: 2.7 10*3/uL (ref 1.4–7.7)
Neutrophils Relative %: 53.7 % (ref 43.0–77.0)
Platelets: 152 10*3/uL (ref 150.0–400.0)
RBC: 4.23 Mil/uL (ref 4.22–5.81)
RDW: 17.1 % — ABNORMAL HIGH (ref 11.5–15.5)
WBC: 5.1 10*3/uL (ref 4.0–10.5)

## 2018-11-28 LAB — IRON: Iron: 53 ug/dL (ref 42–165)

## 2018-11-28 LAB — FERRITIN: Ferritin: 22.7 ng/mL (ref 22.0–322.0)

## 2018-11-28 LAB — RETICULOCYTES
ABS Retic: 68000 cells/uL (ref 25000–9000)
Retic Ct Pct: 1.6 %

## 2018-11-28 MED FILL — GLIMEPIRIDE 1 MG TABLET: 1 | 30 days supply | Qty: 30 | Fill #0

## 2018-11-29 ENCOUNTER — Ambulatory Visit
Admission: RE | Admit: 2018-11-29 | Discharge: 2018-11-29 | Disposition: A | Discharge status: Home / Self Care | Payer: 59 | Source: Ambulatory Visit | Attending: Specialist | Admitting: Specialist

## 2018-11-29 ENCOUNTER — Other Ambulatory Visit: Payer: 59

## 2018-11-29 DIAGNOSIS — M4316 Spondylolisthesis, lumbar region: Secondary | ICD-10-CM | POA: Diagnosis not present

## 2018-11-29 DIAGNOSIS — M4804 Spinal stenosis, thoracic region: Secondary | ICD-10-CM | POA: Diagnosis not present

## 2018-11-29 DIAGNOSIS — Z981 Arthrodesis status: Secondary | ICD-10-CM

## 2018-11-29 DIAGNOSIS — M4324 Fusion of spine, thoracic region: Secondary | ICD-10-CM

## 2018-11-29 DIAGNOSIS — M4807 Spinal stenosis, lumbosacral region: Secondary | ICD-10-CM

## 2018-11-29 MED ORDER — IOPAMIDOL (ISOVUE-M 300) INJECTION 61%
10.0000 mL | Freq: Once | INTRAMUSCULAR | Status: AC | PRN
  Administered 2018-11-29: 10 mL via INTRATHECAL

## 2018-11-29 MED ORDER — MEPERIDINE HCL 100 MG/ML IJ SOLN
75.0000 mg | Freq: Once | INTRAMUSCULAR | Status: AC
  Administered 2018-11-29: 75 mg via INTRAMUSCULAR

## 2018-11-29 MED ORDER — ONDANSETRON HCL 4 MG/2ML IJ SOLN
4.0000 mg | Freq: Once | INTRAMUSCULAR | Status: AC
  Administered 2018-11-29: 4 mg via INTRAMUSCULAR

## 2018-11-29 MED ORDER — DIAZEPAM 5 MG PO TABS
10.0000 mg | ORAL_TABLET | Freq: Once | ORAL | Status: AC
  Administered 2018-11-29: 10 mg via ORAL

## 2018-11-29 NOTE — Discharge Instructions (Signed)

## 2018-12-01 ENCOUNTER — Other Ambulatory Visit: Payer: Self-pay | Admitting: Internal Medicine

## 2018-12-01 ENCOUNTER — Other Ambulatory Visit: Payer: Self-pay | Admitting: Endocrinology

## 2018-12-01 MED ORDER — IRON 325 (65 FE) MG PO TABS: 1.0000 | ORAL_TABLET | Freq: Two times a day (BID) | ORAL | 6 refills | Status: DC

## 2018-12-01 MED FILL — levETIRAcetam 750 MG TABS: 750 | 30 days supply | Qty: 60 | Fill #2

## 2018-12-01 MED FILL — FERROUS SULFATE 325 MG TAB: 325 (65 FE) | 50 days supply | Qty: 100 | Fill #0

## 2018-12-02 ENCOUNTER — Other Ambulatory Visit: Payer: Self-pay | Admitting: Neurosurgery

## 2018-12-02 ENCOUNTER — Ambulatory Visit: Payer: 59 | Admitting: Endocrinology

## 2018-12-02 DIAGNOSIS — M5416 Radiculopathy, lumbar region: Secondary | ICD-10-CM | POA: Diagnosis not present

## 2018-12-02 DIAGNOSIS — D1779 Benign lipomatous neoplasm of other sites: Secondary | ICD-10-CM | POA: Diagnosis not present

## 2018-12-02 DIAGNOSIS — G911 Obstructive hydrocephalus: Secondary | ICD-10-CM | POA: Diagnosis not present

## 2018-12-02 DIAGNOSIS — K7581 Nonalcoholic steatohepatitis (NASH): Secondary | ICD-10-CM | POA: Diagnosis not present

## 2018-12-02 DIAGNOSIS — M545 Low back pain: Secondary | ICD-10-CM | POA: Diagnosis not present

## 2018-12-02 DIAGNOSIS — I10 Essential (primary) hypertension: Secondary | ICD-10-CM | POA: Diagnosis not present

## 2018-12-02 DIAGNOSIS — Z6837 Body mass index (BMI) 37.0-37.9, adult: Secondary | ICD-10-CM | POA: Diagnosis not present

## 2018-12-02 DIAGNOSIS — M4316 Spondylolisthesis, lumbar region: Secondary | ICD-10-CM | POA: Diagnosis not present

## 2018-12-06 ENCOUNTER — Other Ambulatory Visit: Payer: Self-pay | Admitting: Endocrinology

## 2018-12-21 MED FILL — metFORMIN HCL 1000 MG TABS: 1000 | 30 days supply | Qty: 60 | Fill #4

## 2018-12-21 MED FILL — DULOXETINE HCL 60 MG CPEP: 60 | 30 days supply | Qty: 60 | Fill #5

## 2018-12-22 MED FILL — GLIMEPIRIDE 1 MG TABLET: 1 | 30 days supply | Qty: 30 | Fill #0

## 2018-12-28 ENCOUNTER — Other Ambulatory Visit: Payer: Self-pay

## 2018-12-28 ENCOUNTER — Encounter: Payer: Self-pay | Admitting: Endocrinology

## 2018-12-28 ENCOUNTER — Ambulatory Visit (INDEPENDENT_AMBULATORY_CARE_PROVIDER_SITE_OTHER): Payer: 59 | Admitting: Endocrinology

## 2018-12-28 VITALS — BP 132/82 | HR 82 | Ht 71.0 in | Wt 267.6 lb

## 2018-12-28 DIAGNOSIS — N289 Disorder of kidney and ureter, unspecified: Secondary | ICD-10-CM | POA: Diagnosis not present

## 2018-12-28 DIAGNOSIS — E1165 Type 2 diabetes mellitus with hyperglycemia: Secondary | ICD-10-CM

## 2018-12-28 DIAGNOSIS — E1142 Type 2 diabetes mellitus with diabetic polyneuropathy: Secondary | ICD-10-CM

## 2018-12-28 DIAGNOSIS — IMO0002 Reserved for concepts with insufficient information to code with codable children: Secondary | ICD-10-CM

## 2018-12-28 LAB — POCT GLYCOSYLATED HEMOGLOBIN (HGB A1C): Hemoglobin A1C: 11 % — AB (ref 4.0–5.6)

## 2018-12-28 LAB — COMPREHENSIVE METABOLIC PANEL
ALT: 31 U/L (ref 0–53)
AST: 33 U/L (ref 0–37)
Albumin: 4 g/dL (ref 3.5–5.2)
Alkaline Phosphatase: 72 U/L (ref 39–117)
BUN: 21 mg/dL (ref 6–23)
CO2: 23 mEq/L (ref 19–32)
Calcium: 8.7 mg/dL (ref 8.4–10.5)
Chloride: 104 mEq/L (ref 96–112)
Creatinine, Ser: 1.73 mg/dL — ABNORMAL HIGH (ref 0.40–1.50)
GFR: 40.38 mL/min — ABNORMAL LOW (ref >60.00)
Glucose, Bld: 291 mg/dL — ABNORMAL HIGH (ref 70–99)
Potassium: 5 mEq/L (ref 3.5–5.1)
Sodium: 136 mEq/L (ref 135–145)
Total Bilirubin: 0.6 mg/dL (ref 0.2–1.2)
Total Protein: 7.8 g/dL (ref 6.0–8.3)

## 2018-12-28 LAB — MICROALBUMIN / CREATININE URINE RATIO
Creatinine,U: 225.9 mg/dL
Microalb Creat Ratio: 1.5 mg/g (ref 0.0–30.0)
Microalb, Ur: 3.4 mg/dL — ABNORMAL HIGH (ref 0.0–1.9)

## 2018-12-28 LAB — GLUCOSE, POCT (MANUAL RESULT ENTRY): POC Glucose: 275 mg/dl — AB (ref 70–99)

## 2018-12-28 MED ORDER — RYBELSUS 3 MG PO TABS: 3.0000 mg | ORAL_TABLET | Freq: Every day | ORAL | 0 refills | Status: DC

## 2018-12-28 MED FILL — RYBELSUS 3 MG TABS: 3 | 30 days supply | Qty: 30 | Fill #0

## 2018-12-28 NOTE — Progress Notes (Signed)
Patient ID: James [name], male   DOB: 28-Sep-1955, 63 y.o.   MRN: [id number]           Reason for Appointment: Follow-up for Type 2 Diabetes  Referring physician: [name]  History of Present Illness:          Date of diagnosis of type 2 diabetes mellitus:  ?  2014      Background history:  He is not clear how his diabetes was diagnosed, likely on routine testing. Initially had been treated with metformin and also tried on Amaryl Patient had progressive increase in his blood sugars since 1/17 with stopping metformin and being on the regimen of Amaryl and Januvia, he thinks his blood sugars went up to 601.  He was then started on basal bolus insulin   Recent history:   Non-insulin hypoglycemic drugs the patient is taking are: Trulicity 1.5 mg weekly, Metformin 1 g twice a day, Amaryl 68m  His A1c is 11% which is higher than he has had in the last 4 years Last A1c was 6.8  Current management, blood sugar patterns and problems identified:  He did not bring his monitor for download again  Apparently he has checked his sugar only 2 times in the last month or so  He says because of family issues this year he has not been paying attention to his diabetes with going off his diet, not taking his medications consistently especially Trulicity  He has also gained weight recently despite his high readings  Also does not think he has excessive thirst or urination  Usually avoiding all drinks with sugar  He does not like injections and he thinks he may have taken only half of his doses of Trulicity recently  Also because of his back pain he has not had any physical activity  Lab glucose today nonfasting was 275  Side  effects from medications have been: None  Compliance with the medical regimen: Fair  Glucose monitoring:  done <1 times a day         Glucometer:  Accu-Chek Blood Glucose readings in the last 1 at home: 213, 239  Self-care:   Meal times are:  Breakfast may be skipped.   Lunch: 2-3 PM Dinner: 5-6 PM    Dietician visit, most recent:09/2015                Weight history:  Wt Readings from Last 3 Encounters:  12/28/18 267 lb 9.6 oz (121.4 kg)  11/16/18 260 lb (117.9 kg)  11/09/18 260 lb (117.9 kg)    Glycemic control:   Lab Results  Component Value Date   HGBA1C 11.0 (A) 12/28/2018   HGBA1C 6.8 (H) 05/03/2018   HGBA1C 6.6 (H) 01/28/2018   Lab Results  Component Value Date   MICROALBUR 3.4 (H) 12/28/2018   LDLCALC 137 (H) 09/07/2016   CREATININE 1.73 (H) 12/28/2018    Office Visit on 12/28/2018  Component Date Value Ref Range Status  . Microalb, Ur 12/28/2018 3.4* 0.0 - 1.9 mg/dL Final  . Creatinine,U 12/28/2018 225.9  mg/dL Final  . Microalb Creat Ratio 12/28/2018 1.5  0.0 - 30.0 mg/g Final  . Sodium 12/28/2018 136  135 - 145 mEq/L Final  . Potassium 12/28/2018 5.0  3.5 - 5.1 mEq/L Final  . Chloride 12/28/2018 104  96 - 112 mEq/L Final  . CO2 12/28/2018 23  19 - 32 mEq/L Final  . Glucose, Bld 12/28/2018 291* 70 - 99 mg/dL Final  . BUN 12/28/2018 21  6 - 23 mg/dL Final  . Creatinine, Ser 12/28/2018 1.73* 0.40 - 1.50 mg/dL Final  . Total Bilirubin 12/28/2018 0.6  0.2 - 1.2 mg/dL Final  . Alkaline Phosphatase 12/28/2018 72  39 - 117 U/L Final  . AST 12/28/2018 33  0 - 37 U/L Final  . ALT 12/28/2018 31  0 - 53 U/L Final  . Total Protein 12/28/2018 7.8  6.0 - 8.3 g/dL Final  . Albumin 12/28/2018 4.0  3.5 - 5.2 g/dL Final  . Calcium 12/28/2018 8.7  8.4 - 10.5 mg/dL Final  . GFR 12/28/2018 40.38* >60.00 mL/min Final  . Hemoglobin A1C 12/28/2018 11.0* 4.0 - 5.6 % Final  . POC Glucose 12/28/2018 275* 70 - 99 mg/dl Final       Allergies as of 12/28/2018      Reactions   Bee Venom Swelling   Hydrocodone-homatropine Other (See Comments)   Depressed feeling   Morphine And Related Other (See Comments)   Hallucinations, back in the 80s. States has taken vicodin before w/o problems    Sulfa Drugs Cross Reactors Rash      Medication List        Accurate as of December 28, 2018  9:03 PM. If you have any questions, ask your nurse or doctor.        Adalimumab 40 MG/0.8ML Pnkt Commonly known as: Humira Pen Inject 0.8 mLs into the skin every 14 (fourteen) days. Inject 40 mg (0.8 ml) under the skin every other week.   atorvastatin 10 MG tablet Commonly known as: LIPITOR TAKE 1 TABLET (10 MG TOTAL) BY MOUTH DAILY.   carvedilol 12.5 MG tablet Commonly known as: COREG Take 1 tablet (12.5 mg total) by mouth 2 (two) times daily with a meal.   DULoxetine 60 MG capsule Commonly known as: CYMBALTA Take 2 capsules (120 mg total) by mouth daily.   EPINEPHrine 0.3 mg/0.3 mL Soaj injection Commonly known as: EpiPen 2-Pak Inject 0.3 mLs (0.3 mg total) into the muscle once as needed for up to 1 dose.   fenofibrate micronized 134 MG capsule Commonly known as: LOFIBRA TAKE 1 CAPSULE (134 MG TOTAL) BY MOUTH DAILY BEFORE BREAKFAST.   FreeStyle Freedom Lite w/Device Kit Use to check blood sugars 3 times daily. Dx code: E11.9   freestyle lancets Use to check blood sugar 3 times daily. Dx code E11.9   gabapentin 600 MG tablet Commonly known as: NEURONTIN Take 1 tablet (600 mg total) by mouth 2 (two) times daily.   Galcanezumab-gnlm 120 MG/ML Soaj Commonly known as: Emgality Inject 1 pen into the skin every 28 (twenty-eight) days.   glimepiride 1 MG tablet Commonly known as: AMARYL TAKE 1 TABLET (1 MG TOTAL) BY MOUTH DAILY BEFORE SUPPER.   glucose blood test strip Commonly known as: Accu-Chek Guide Use to test blood sugar three times daily   Iron 325 (65 Fe) MG Tabs Take 1 tablet (325 mg total) by mouth 2 (two) times daily. Take on a empty stomach   levETIRAcetam 750 MG tablet Commonly known as: KEPPRA TAKE 1 TABLET (750 MG TOTAL) BY MOUTH 2 (TWO) TIMES DAILY.   metFORMIN 1000 MG tablet Commonly known as: GLUCOPHAGE TAKE 1 TABLET BY MOUTH TWICE DAILY WITH A MEAL   naproxen sodium 220 MG tablet Commonly known as: ALEVE  Take 220 mg by mouth as needed.   omeprazole 40 MG capsule Commonly known as: PRILOSEC Take 1 capsule (40 mg total) by mouth 2 (two) times daily before a meal.  Rybelsus 3 MG Tabs Generic drug: Semaglutide Take 3 mg by mouth daily before breakfast. Started by: Elayne Snare, MD   triamcinolone cream 0.1 % Commonly known as: KENALOG Apply 1 application topically as needed.   Trulicity 1.5 JQ/3.0SP Sopn Generic drug: Dulaglutide Inject 1.5 mg into the skin once a week.       Allergies:  Allergies  Allergen Reactions  . Bee Venom Swelling  . Hydrocodone-Homatropine Other (See Comments)    Depressed feeling  . Morphine And Related Other (See Comments)    Hallucinations, back in the 80s. States has taken vicodin before w/o problems   . Sulfa Drugs Cross Reactors Rash    Past Medical History:  Diagnosis Date  . Acid reflux   . Arthritis   . Chronic headaches    on cymbalta  . Cirrhosis ([place])   . Colon polyps   . Complication of anesthesia    problems waking up from anesthesia  . Depression    on cymbalta  . Diabetes mellitus with neuropathy ([place])    borderline per primary MD  . Diverticulitis 03/2013  . Eczema   . Elevated LFTs   . Fatty liver   . History of kidney stones   . Hypertension   . Insomnia 04/26/2013  . Kidney stone   . Morbid obesity ([place])   . Neuromuscular disorder ([facility])    neuropathy  . Neuropathy   . Post-traumatic hydrocephalus ([facility])    s/p shunts x 2 (first got infected )  . Psoriasis    sees Dr [name]  . Psoriatic arthritis ([place])   . 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 Sleep apnea    no CPAP     . Stomach ulcer   . Testosterone deficiency 04/28/2011    Past Surgical History:  Procedure Laterality Date  . BACK SURGERY  1980  . CHOLECYSTECTOMY N/A 08/25/2017   Procedure: LAPAROSCOPIC CHOLECYSTECTOMY WITH INTRAOPERATIVE CHOLANGIOGRAM;  Surgeon: [name], MD;  Location: [facility];  Service:  General;  Laterality: N/A;  . COLONOSCOPY    . SHOULDER SURGERY Left 2010  . TOE SURGERY Left 2018  . TOTAL HIP ARTHROPLASTY Left 2011  . VENTRICULOPERITONEAL SHUNT  2007   x2    Family History  Problem Relation Age of Onset  . Other Mother   . Lung cancer Father        alive, former smoker   . Heart disease Brother        MI age 56  . Other Brother        Murdered  . Down syndrome Son   . Diabetes Neg Hx   . Prostate cancer Neg Hx   . Colon cancer Neg Hx   . Stomach cancer Neg Hx   . Pancreatic cancer Neg Hx   . Liver disease Neg Hx     Social History:  reports that he has never smoked. He has never used smokeless tobacco. He reports that he does not drink alcohol or use drugs.    Review of Systems     HYPERTENSION: Blood pressure  is controlled Followed by PCP   BP Readings from Last 3 Encounters:  12/28/18 132/82  11/29/18 131/76  11/16/18 (!) 138/91    Lipid history: He is on Lipitor 10 mg with control of LDL previously Is on fenofibrate for triglyceride treatment, has increased triglycerides persistently     Lab Results  Component Value Date   CHOL 170 05/03/2018  HDL 43.00 05/03/2018   LDLCALC 137 (H) 09/07/2016   LDLDIRECT 89.0 05/03/2018   TRIG 332.0 (H) 05/03/2018   CHOLHDL 4 05/03/2018            Most recent foot exam: 08/2017  He has symptomatic painful neuropathy and is taking gabapentin twice a day and  Cymbalta Also has difficulty with balance Has been followed by neurologist    Review of Systems    Physical Examination:  BP 132/82 (BP Location: Left Arm, Patient Position: Sitting, Cuff Size: Normal)   Pulse 82   Ht 5' 11"  (1.803 m)   Wt 267 lb 9.6 oz (121.4 kg)   SpO2 96%   BMI 37.32 kg/m       ASSESSMENT:  Diabetes type 2 with obesity  See history of present illness for discussion of current diabetes management, blood sugar patterns and problems identified  Currently on a regimen of metformin, Trulicity and  low-dose Amaryl  A1c is 11% which is markedly higher than usual  This is without any recent steroids High blood sugar is likely to be from a combination of poor diet, weight gain, progression of the diabetes and irregular administration of his Trulicity He is not motivated to take care of his diabetes and is not monitoring his glucose Also did not call to report his blood sugars remaining over 200 in the last few weeks  Currently he is scheduled for back surgery and not clear if he can do this if his blood sugars are persistently poorly controlled  LIPIDS: Will need fasting lipids when diabetes is better controlled  PLAN:    Trial of Rybelsus instead of Trulicity  He will use 3 mg daily for the first 2 weeks and then 2 tablets of the 3 mg  Discussed how this works, timing of taking the medication, possible side effects  Given patient information booklet on this  Also increase Amaryl to twice daily  He will start checking his blood sugars at least once a day  Consistent diet  He will call if his blood sugars are not improved in 2 weeks  Labs to be checked today  Discussed that if his blood sugars are progressively higher he may need to go back on insulin  Detailed instructions written out on AVS and explained to patient    Patient Instructions  Check blood sugars on waking up 2-3 days a week  Also check blood sugars about 2 hours after meals and do this after different meals by rotation  Recommended blood sugar levels on waking up are 90-130 and about 2 hours after meal is 130-160  Please bring your blood sugar monitor to each visit, thank you  Cut snacks like down and avoid all regular soft drinks  Stop Trulicity and start RYBELSUS as directed  Take 3 mg Rybelsus in the morning daily for 2 weeks and then go up to 2 pills daily at the same time When prescription is finished please call for prescription for 7 mg dosage  Increase GLIMEPIRIDE to twice a day   Continue metformin  Call if blood sugars are still over 200 in about 2 weeks  Counseling time on subjects discussed in assessment and plan sections is over 50% of today's 25 minute visit   Elayne Snare 12/28/2018, 9:03 PM   Note: This office note was prepared with Dragon voice recognition system technology. Any transcriptional errors that result from this process are unintentional.     ADDENDUM: His creatinine is 1.73 with GFR  40.  Need to reduce his metformin to half a tablet in the morning for now Also reduce fenofibrate to 3 times a week, stop naproxen and follow-up with PCP  Elayne Snare

## 2018-12-28 NOTE — Patient Instructions (Addendum)
Check blood sugars on waking up 2-3 days a week  Also check blood sugars about 2 hours after meals and do this after different meals by rotation  Recommended blood sugar levels on waking up are 90-130 and about 2 hours after meal is 130-160  Please bring your blood sugar monitor to each visit, thank you  Cut snacks like down and avoid all regular soft drinks  Stop Trulicity and start RYBELSUS as directed  Take 3 mg Rybelsus in the morning daily for 2 weeks and then go up to 2 pills daily at the same time When prescription is finished please call for prescription for 7 mg dosage  Increase GLIMEPIRIDE to twice a day  Continue metformin  Call if blood sugars are still over 200 in about 2 weeks

## 2018-12-29 ENCOUNTER — Telehealth: Payer: Self-pay | Admitting: Internal Medicine

## 2018-12-29 ENCOUNTER — Ambulatory Visit: Payer: Self-pay

## 2018-12-29 DIAGNOSIS — R7989 Other specified abnormal findings of blood chemistry: Secondary | ICD-10-CM

## 2018-12-29 NOTE — Telephone Encounter (Signed)
See TE for 12/29/18

## 2018-12-29 NOTE — Telephone Encounter (Signed)
Patient called and advised of the note below by Dr. [name], he verbalized understanding. He says he has to go for pre-op work 7 days before his surgery on 01/19/19 and will need to be quarantined, since he's having a covid test. I advised to call the surgeon to see if he will be having blood work and to let them know of the request from Dr. [name], he says he will call on Monday.

## 2018-12-29 NOTE — Telephone Encounter (Addendum)
Labs at endocrinology showed increased creatinine, advised patient I recommend to check a BMP 2 weeks from today.  DX: Increased creatinine Otherwise, he needs to follow-up the recommendations of endocrinology (drink plenty fluids,  stop naproxen, etc.)

## 2018-12-29 NOTE — Telephone Encounter (Signed)
LMOM informing Pt to return call. Okay for PEC to discuss. BMP ordered.

## 2019-01-02 ENCOUNTER — Other Ambulatory Visit: Payer: Self-pay

## 2019-01-02 NOTE — Telephone Encounter (Signed)
Waiting for Pt's call back.

## 2019-01-02 NOTE — Telephone Encounter (Signed)
FYI. See note below.

## 2019-01-02 NOTE — Telephone Encounter (Signed)
Advised patient to proceed with BMP in this office next week

## 2019-01-04 NOTE — Telephone Encounter (Signed)
Lab appt scheduled 01/05/2019.

## 2019-01-05 ENCOUNTER — Other Ambulatory Visit (INDEPENDENT_AMBULATORY_CARE_PROVIDER_SITE_OTHER): Payer: 59

## 2019-01-05 ENCOUNTER — Other Ambulatory Visit: Payer: Self-pay

## 2019-01-05 ENCOUNTER — Other Ambulatory Visit: Payer: Self-pay | Admitting: Internal Medicine

## 2019-01-05 ENCOUNTER — Encounter: Payer: Self-pay | Admitting: Internal Medicine

## 2019-01-05 ENCOUNTER — Other Ambulatory Visit: Payer: Self-pay | Admitting: Endocrinology

## 2019-01-05 DIAGNOSIS — R7989 Other specified abnormal findings of blood chemistry: Secondary | ICD-10-CM | POA: Diagnosis not present

## 2019-01-05 LAB — BASIC METABOLIC PANEL
BUN: 22 mg/dL (ref 6–23)
CO2: 23 mEq/L (ref 19–32)
Calcium: 9.3 mg/dL (ref 8.4–10.5)
Chloride: 100 mEq/L (ref 96–112)
Creatinine, Ser: 1.97 mg/dL — ABNORMAL HIGH (ref 0.40–1.50)
GFR: 34.76 mL/min — ABNORMAL LOW (ref >60.00)
Glucose, Bld: 385 mg/dL — ABNORMAL HIGH (ref 70–99)
Potassium: 4.7 mEq/L (ref 3.5–5.1)
Sodium: 133 mEq/L — ABNORMAL LOW (ref 135–145)

## 2019-01-05 MED FILL — FENOFIBRATE 134 MG CAPSULE: 134 | 89 days supply | Qty: 38 | Fill #0

## 2019-01-05 MED FILL — GABAPENTIN 600 MG TABS: 600 | 30 days supply | Qty: 60 | Fill #0

## 2019-01-05 MED FILL — levETIRAcetam 750 MG TABS: 750 | 30 days supply | Qty: 60 | Fill #3

## 2019-01-06 ENCOUNTER — Ambulatory Visit (HOSPITAL_BASED_OUTPATIENT_CLINIC_OR_DEPARTMENT_OTHER)
Admission: RE | Admit: 2019-01-06 | Discharge: 2019-01-06 | Disposition: A | Discharge status: Home / Self Care | Payer: 59 | Source: Ambulatory Visit | Attending: Internal Medicine | Admitting: Internal Medicine

## 2019-01-06 ENCOUNTER — Telehealth: Payer: Self-pay | Admitting: Internal Medicine

## 2019-01-06 DIAGNOSIS — R7989 Other specified abnormal findings of blood chemistry: Secondary | ICD-10-CM | POA: Insufficient documentation

## 2019-01-06 DIAGNOSIS — Z01818 Encounter for other preprocedural examination: Secondary | ICD-10-CM | POA: Diagnosis not present

## 2019-01-06 NOTE — Telephone Encounter (Signed)
Creatinine has been gradually increasing in the last 3 months. He had CT abdomen with contrast 09/13/2018.  No renal obstruction noted at that time. Plan: Renal ultrasound We will send a message to endocrinology regards to increased creatinine. Refer to nephrology Advise patient:  Needs extremity good hydration with water. Do not take any prescribed or OTC NSAIDs. BMP in 1 month Please arrange all of the above.

## 2019-01-06 NOTE — Telephone Encounter (Signed)
Spoke w/ PCP- Pt to have spinal surgery w/ Dr. Vertell Limber on 01/19/2019- per PCP instead of repeating BMP in 1 month repeat in 10 days.

## 2019-01-06 NOTE — Telephone Encounter (Signed)
Spoke w/ Pt- informed of results and recommendations. Pt will come to office on 01/16/2019 at 8am to recheck bmp prior to pre-op labs (he will have to quarantine after covid test that day) and will try to get renal ultrasound scheduled prior to surgery. Also informed Pt that I would send these labs and plan to Dr. Vertell Limber as well. Pt verbalized understanding.

## 2019-01-08 ENCOUNTER — Telehealth: Payer: Self-pay | Admitting: Endocrinology

## 2019-01-08 NOTE — Telephone Encounter (Signed)
Have reviewed recent labs done by PCP.  His blood sugar was 385 so will need to start him on insulin. He will start on Humalog mix 10 units before breakfast and dinner, continue Rybelsus. Need to be scheduled to see Vaughan Basta for instructions this week.  Meanwhile need to check his blood sugars 4 times a day Also to stop metformin and fenofibrate because kidney test is worse. He needs to keep his appointment on 7/16 with me but prefer telehealth visit

## 2019-01-09 ENCOUNTER — Other Ambulatory Visit: Payer: Self-pay | Admitting: Endocrinology

## 2019-01-09 ENCOUNTER — Other Ambulatory Visit: Payer: Self-pay

## 2019-01-09 DIAGNOSIS — E1165 Type 2 diabetes mellitus with hyperglycemia: Secondary | ICD-10-CM

## 2019-01-09 MED ORDER — INSULIN LISPRO PROT & LISPRO (75-25 MIX) 100 UNIT/ML KWIKPEN: PEN_INJECTOR | SUBCUTANEOUS | 0 refills | Status: DC

## 2019-01-09 MED ORDER — INSULIN PEN NEEDLE 31G X 5 MM MISC: 1 refills | Status: DC

## 2019-01-09 MED FILL — BD PEN NDL MINI 31GX5MM: 31G X 5 MM | 50 days supply | Qty: 100 | Fill #0

## 2019-01-09 MED FILL — HUMALOG MIX 75-25 KWIKPEN: (75-25) 100 | 75 days supply | Qty: 15 | Fill #0

## 2019-01-09 NOTE — Telephone Encounter (Signed)
Will need rx for Humalog mix sent in.

## 2019-01-09 NOTE — Telephone Encounter (Signed)
Humalog mix has been sent.  Referral for James [name] has been made, please schedule appointment.  Please confirm that he is checking blood sugars 4 times a day.

## 2019-01-09 NOTE — Telephone Encounter (Signed)
Patient aware of below. He has agreed to switch visit on 7/16 to a doxy visit. Email listed in appt notes. Transferred call to [facility] to schedule appt with Vaughan Basta

## 2019-01-11 ENCOUNTER — Other Ambulatory Visit: Payer: Self-pay

## 2019-01-11 ENCOUNTER — Encounter: Payer: 59 | Attending: Endocrinology | Admitting: Nutrition

## 2019-01-11 DIAGNOSIS — E1165 Type 2 diabetes mellitus with hyperglycemia: Secondary | ICD-10-CM | POA: Insufficient documentation

## 2019-01-11 NOTE — Patient Instructions (Signed)
1.  eat 1 Tbsp of peanut butter or handful of nuts, or piece of cheese before drinking sweet drink int he AM,  to slow blood sugar rise down.  2.  Test blood sugars before meals and at bedtime.  3. Call us in one week , or before, if blood sugars drop low

## 2019-01-11 NOTE — Progress Notes (Signed)
Patient and his wife are here to learn how to give insulin.  He has done this before (when he was in the hospital during his last surgery). We reviewed how to use the pen and where, when and how much to take.  Written instructions were given to take 10u before breakfast and supper.   He gave his first injection in the office today, injecting into his abdomen.  He did not test his blood sugar this AM, but wife says it is running in the 300s.  BD starter kit given with directions for pen use, and glucose tablets and general diabetes information.   We revirwed low blood sugars--symptoms and treatment.  He reported good understanding of this.  Typical day: 8-9AM up.  NO FOOD   Does drink sweet flavored waters (30g) or sweet tea, or soda during the morning.  No food 1:30 PM  NO FOOD.  Usually will go outside and do something around the house.  Having much back pain, so exercise is limited.   6PM-7PM: Supper:  6 ounces protein, 30 grams of carb in the form of a starchy veg., and a non starchy veg.  No bread.  Sweet tea to drink. 9PM: piece of fruit.    Suggestions given:   1.  Stop sweet drinks.  Pt. Says " no".  So suggested he eat 1 Tbsp of peanut butter or handful of nut to slow blood sugar rise down.  He agreed to do this.  Also told him His current diet will  require he take a lot more insulin, which will make him gain weight.  Strongly suggested her reduce the amount of sweetened beverages each day.    2.  Test blood sugars ac and HS.   3. Call us in one week , or before, if blood sugars drop low

## 2019-01-12 ENCOUNTER — Ambulatory Visit: Payer: 59 | Admitting: Endocrinology

## 2019-01-12 ENCOUNTER — Other Ambulatory Visit: Payer: 59

## 2019-01-12 DIAGNOSIS — E119 Type 2 diabetes mellitus without complications: Secondary | ICD-10-CM | POA: Diagnosis not present

## 2019-01-12 DIAGNOSIS — K7581 Nonalcoholic steatohepatitis (NASH): Secondary | ICD-10-CM | POA: Diagnosis not present

## 2019-01-12 DIAGNOSIS — I10 Essential (primary) hypertension: Secondary | ICD-10-CM | POA: Diagnosis not present

## 2019-01-12 DIAGNOSIS — N183 Chronic kidney disease, stage 3 (moderate): Secondary | ICD-10-CM | POA: Diagnosis not present

## 2019-01-12 LAB — BASIC METABOLIC PANEL
BUN: 26 — AB (ref 4–21)
Creatinine: 2 — AB (ref 0.6–1.3)
Glucose: 372
Potassium: 5.2 (ref 3.4–5.3)
Sodium: 131 — AB (ref 137–147)

## 2019-01-12 LAB — CBC AND DIFFERENTIAL
HCT: 39 — AB (ref 41–53)
Hemoglobin: 12.8 — AB (ref 13.5–17.5)
Neutrophils Absolute: 5
Platelets: 189 (ref 150–399)
WBC: 7.8

## 2019-01-13 NOTE — Pre-Procedure Instructions (Signed)
[place], [place] [address] Phone: [phone] Fax: [phone]      Your procedure is scheduled on Thursday July 23rd.  Report to [facility] Main Entrance "A" at 5:30 A.M., and check in at the Admitting office.  Call this number if you have problems the morning of surgery:  [phone]  Call [phone] if you have any questions prior to your surgery date Monday-Friday 8am-4pm    Remember:  Do not eat or drink after midnight the night before your surgery   Take these medicines the morning of surgery with A SIP OF WATER  carvedilol (COREG) DULoxetine (CYMBALTA)  gabapentin (NEURONTIN) levETIRAcetam (KEPPRA) omeprazole (PRILOSEC)    HOW TO MANAGE YOUR DIABETES BEFORE AND AFTER SURGERY  Why is it important to control my blood sugar before and after surgery? . Improving blood sugar levels before and after surgery helps healing and can limit problems. . A way of improving blood sugar control is eating a healthy diet by: o  Eating less sugar and carbohydrates o  Increasing activity/exercise o  Talking with your doctor about reaching your blood sugar goals . High blood sugars (greater than 180 mg/dL) can raise your risk of infections and slow your recovery, so you will need to focus on controlling your diabetes during the weeks before surgery. . Make sure that the doctor who takes care of your diabetes knows about your planned surgery including the date and location.  How do I manage my blood sugar before surgery? . Check your blood sugar at least 4 times a day, starting 2 days before surgery, to make sure that the level is not too high or low. o Check your blood sugar the morning of your surgery when you wake up and every 2 hours until you get to the Short Stay unit. . If your blood sugar is less than 70 mg/dL, you will need to treat for low blood sugar: o Do not take  insulin. o Treat a low blood sugar (less than 70 mg/dL) with  cup of clear juice (cranberry or apple), 4 glucose tablets, OR glucose gel. Recheck blood sugar in 15 minutes after treatment (to make sure it is greater than 70 mg/dL). If your blood sugar is not greater than 70 mg/dL on recheck, call [phone] o  for further instructions. . Report your blood sugar to the short stay nurse when you get to Short Stay.  . If you are admitted to the hospital after surgery: o Your blood sugar will be checked by the staff and you will probably be given insulin after surgery (instead of oral diabetes medicines) to make sure you have good blood sugar levels. o The goal for blood sugar control after surgery is 80-180 mg/dL.     WHAT DO I DO ABOUT MY DIABETES MEDICATION?   Marland Kitchen Do not take oral diabetes medicines (pills): Semaglutide (RYBELSUS), glimepiride (AMARYL) or metFORMIN (GLUCOPHAGE) the morning of surgery.  . THE NIGHT BEFORE SURGERY, do not take bedtime dose of  Insulin Lispro Prot & Lispro (HUMALOG MIX 75/25 KWIKPEN)      . The day of surgery, do not take other diabetes injectables, including Byetta (exenatide), Bydureon (exenatide ER), Victoza (liraglutide), or Trulicity (dulaglutide).   As of today, STOP taking any Aspirin (unless otherwise instructed by your surgeon), Aleve, Naproxen, Ibuprofen, Motrin, Advil, Goody's, BC's, all herbal medications, fish oil, and all vitamins.  The Morning of Surgery  Do not wear jewelry, make-up or nail polish.  Do not wear lotions, powders, or perfumes/colognes, or deodorant  Do not shave 48 hours prior to surgery.  Men may shave face and neck.  Do not bring valuables to the hospital.  [facility] is not responsible for any belongings or valuables.  If you are a smoker, DO NOT Smoke 24 hours prior to surgery IF you wear a CPAP at night please bring your mask, tubing, and machine the morning of surgery   Remember that you must have someone to  transport you home after your surgery, and remain with you for 24 hours if you are discharged the same day.   Contacts, glasses, hearing aids, dentures or bridgework may not be worn into surgery.    Leave your suitcase in the car.  After surgery it may be brought to your room.  For patients admitted to the hospital, discharge time will be determined by your treatment team.  Patients discharged the day of surgery will not be allowed to drive home.    Special instructions:   [facility]- Preparing For Surgery  Before surgery, you can play an important role. Because skin is not sterile, your skin needs to be as free of germs as possible. You can reduce the number of germs on your skin by washing with CHG (chlorahexidine gluconate) Soap before surgery.  CHG is an antiseptic cleaner which kills germs and bonds with the skin to continue killing germs even after washing.    Oral Hygiene is also important to reduce your risk of infection.  Remember - BRUSH YOUR TEETH THE MORNING OF SURGERY WITH YOUR REGULAR TOOTHPASTE  Please do not use if you have an allergy to CHG or antibacterial soaps. If your skin becomes reddened/irritated stop using the CHG.  Do not shave (including legs and underarms) for at least 48 hours prior to first CHG shower. It is OK to shave your face.  Please follow these instructions carefully.   1. Shower the NIGHT BEFORE SURGERY and the MORNING OF SURGERY with CHG Soap.   2. If you chose to wash your hair, wash your hair first as usual with your normal shampoo.  3. After you shampoo, rinse your hair and body thoroughly to remove the shampoo.  4. Use CHG as you would any other liquid soap. You can apply CHG directly to the skin and wash gently with a scrungie or a clean washcloth.   5. Apply the CHG Soap to your body ONLY FROM THE NECK DOWN.  Do not use on open wounds or open sores. Avoid contact with your eyes, ears, mouth and genitals (private parts). Wash Face and  genitals (private parts)  with your normal soap.   6. Wash thoroughly, paying special attention to the area where your surgery will be performed.  7. Thoroughly rinse your body with warm water from the neck down.  8. DO NOT shower/wash with your normal soap after using and rinsing off the CHG Soap.  9. Pat yourself dry with a CLEAN TOWEL.  10. Wear CLEAN PAJAMAS to bed the night before surgery, wear comfortable clothes the morning of surgery  11. Place CLEAN SHEETS on your bed the night of your first shower and DO NOT SLEEP WITH PETS.    Day of Surgery:  Do not apply any deodorants/lotions. Please shower the morning of surgery with the CHG soap  Please wear clean clothes to the hospital/surgery center.   Remember  to brush your teeth WITH YOUR REGULAR TOOTHPASTE.   Please read over the following fact sheets that you were given.

## 2019-01-16 ENCOUNTER — Encounter (HOSPITAL_COMMUNITY): Payer: Self-pay | Admitting: Vascular Surgery

## 2019-01-16 ENCOUNTER — Other Ambulatory Visit: Payer: Self-pay

## 2019-01-16 ENCOUNTER — Ambulatory Visit: Payer: 59 | Admitting: Endocrinology

## 2019-01-16 ENCOUNTER — Other Ambulatory Visit (HOSPITAL_COMMUNITY)
Admission: RE | Admit: 2019-01-16 | Discharge: 2019-01-16 | Disposition: A | Discharge status: Home / Self Care | Payer: 59 | Source: Ambulatory Visit | Attending: Neurosurgery | Admitting: Neurosurgery

## 2019-01-16 ENCOUNTER — Ambulatory Visit: Payer: 59 | Admitting: Internal Medicine

## 2019-01-16 ENCOUNTER — Encounter (HOSPITAL_COMMUNITY): Payer: Self-pay

## 2019-01-16 ENCOUNTER — Encounter (HOSPITAL_COMMUNITY)
Admission: RE | Admit: 2019-01-16 | Discharge: 2019-01-16 | Disposition: A | Discharge status: Home / Self Care | Payer: 59 | Source: Ambulatory Visit | Attending: Neurosurgery | Admitting: Neurosurgery

## 2019-01-16 DIAGNOSIS — Z20828 Contact with and (suspected) exposure to other viral communicable diseases: Secondary | ICD-10-CM | POA: Insufficient documentation

## 2019-01-16 DIAGNOSIS — Z01812 Encounter for preprocedural laboratory examination: Secondary | ICD-10-CM | POA: Diagnosis not present

## 2019-01-16 LAB — SURGICAL PCR SCREEN
MRSA, PCR: NEGATIVE
Staphylococcus aureus: POSITIVE — AB

## 2019-01-16 LAB — COMPREHENSIVE METABOLIC PANEL
ALT: 45 U/L — ABNORMAL HIGH (ref 0–44)
AST: 52 U/L — ABNORMAL HIGH (ref 15–41)
Albumin: 3.6 g/dL (ref 3.5–5.0)
Alkaline Phosphatase: 75 U/L (ref 38–126)
Anion gap: 11 (ref 5–15)
BUN: 21 mg/dL — ABNORMAL HIGH (ref 6–20)
CO2: 21 mmol/L — ABNORMAL LOW (ref 22–32)
Calcium: 9.2 mg/dL (ref 8.9–10.3)
Chloride: 97 mmol/L — ABNORMAL LOW (ref 98–111)
Creatinine, Ser: 1.89 mg/dL — ABNORMAL HIGH (ref 0.61–1.24)
GFR calc Af Amer: 44 mL/min — ABNORMAL LOW (ref >60)
GFR calc non Af Amer: 38 mL/min — ABNORMAL LOW (ref >60)
Glucose, Bld: 397 mg/dL — ABNORMAL HIGH (ref 70–99)
Potassium: 4.6 mmol/L (ref 3.5–5.1)
Sodium: 129 mmol/L — ABNORMAL LOW (ref 135–145)
Total Bilirubin: 1.1 mg/dL (ref 0.3–1.2)
Total Protein: 7.6 g/dL (ref 6.5–8.1)

## 2019-01-16 LAB — TYPE AND SCREEN
ABO/RH(D): B POS
Antibody Screen: NEGATIVE

## 2019-01-16 LAB — SARS CORONAVIRUS 2 (TAT 6-24 HRS): SARS Coronavirus 2: NEGATIVE

## 2019-01-16 LAB — GLUCOSE, CAPILLARY: Glucose-Capillary: 377 mg/dL — ABNORMAL HIGH (ref 70–99)

## 2019-01-16 NOTE — Progress Notes (Addendum)
Anesthesia Chart Review:  Case: [id number] Date/Time: 01/19/19 0715   Procedure: Lumbar 4-5 Lumbar 5 Sacral 1 Posterior lumbar interbody fusion (N/A Back) - Lumbar 4-5 Lumbar 5 Sacral 1 Posterior lumbar interbody fusion   Anesthesia type: General   Pre-op diagnosis: Spondylolisthesis, Lumbar region   Location: [facility] OR ROOM 21 / [place] OR   Surgeon: Erline Levine, MD      DISCUSSION: Patient is a 63 year old male scheduled for the above procedure.  History includes never smoker, HTN, DM2 (with neuropathy), fatty liver/cirrhosis, psoriatic arthritis, chronic headaches (history of migraines, idiopathic intracranial hypertension), acid reflux, OSA (CPAP intolerance), posttraumatic hydrocephalus (s/p VP shunt 2007), left THA 01/21/10. BMI is consistent with obesity.   - ED visit 09/19/18 for chest pain. Troponin negative. Lipase slightly elevated at 88. CT without obvious pancreatitis. Symptoms improved after GI cocktail. Advised to follow-up with GI. Of note, EKG showed inferior and lateral Q waves. ED provider felt tracing stable when compared to last tracing; however, Q waves in V5-6 appears new since 08/17/17, although small Q wave in V6 noted on post-infusion EKG from stress test 08/24/17.   - 12/28/18 labs (done by PCP Dr. [name]) showed worsening diabetes control (since 04/2018) and renal function (since 08/2018). He was referred back to endocrinology and referral sent to nephrology.  Multiple issues to be sorted out before surgery:  - ED visit for chest pain 09/19/18, although ED provider felt more likely GI etiology. Question if new Q waves in lateral leads. To review with anesthesiologist. He last saw Dr. Agustin Cree in March 2019.  - Uncontrolled diabetes. A1c increased from 11.0 on 12/28/18 (up from 6.8 05/03/18). Dr. [name] started start him on insulin (Humalog 75/25) with instructions to stop metformin and fenofibrate because of worsening renal function. He received insulin injection teaching on 01/11/19.  Reportedly home CBGs are still running 300-400's. If surgery is not urgent, he would likely benefit from postponing surgery to get DM better controlled. - Worsening renal function since March 2020. He has pending nephrology referral. May need to clarify with PCP if this should occur prior to surgery.      Creatinine, Ser (mg/dL)  Date Value  01/16/2019 1.89 (H)  01/05/2019 1.97 (H)  12/28/2018 1.73 (H)  09/19/2018 1.13  08/02/2018 1.09  05/03/2018 1.30   I have left voice message with Janett Billow at Dr. Melven Sartorius office regarding issues with diabetes and renal function. I will follow-up with her on 01/17/19 following review with anesthesiologist.    ADDENDUM 01/17/19 10:34 AM: Reviewed chart with anesthesiologist Oren Bracket, MD including 09/19/18 EKG. He also felt Q waves in V5-6 were new and noted that inferior Q waves and T wave abnormality more pronounced and would need further cardiology input. With recently worsened DM control and renal function, he also agreed that elective surgery should be postponed to get these further evaluated/better controlled. I will follow-up with Janett Billow at Dr. Melven Sartorius office.    VS: BP 129/78   Pulse 88   Temp (!) 36.3 C   Resp 20   Ht 5' 11"  (1.803 m)   Wt 118.8 kg   SpO2 98%   BMI 36.51 kg/m    PROVIDERS: Colon Branch, MD is PCP.  Elayne Snare, MD is endocrinologist. - Arlice Colt, MD is neurologist. Last visit 10/24/18.  Scarlette Shorts, MD is GI. Last visit 09/29/18 with EGD on 11/16/18 that showed incidental gastric polyp (s/p biopsy: inflammation, no malignancy), mild changes of portal gastropathy, and no varices. Joycelyn Rua,  Herbie Baltimore, MD is cardiologist. Seen 08/18/17 for preoperative evaluation prior to cholecystectomy. Stress and echo ordered. Last seen 09/15/17 for post-operative follow-up and on-going communication about CAD risk factor modification (ie, baby ASA, monitoring cholesterol, exercise regularly when recovered from surgery). He had  planned on seeing patient in three months.  - He has pending nephrology referral to [facility].   LABS: Labs reviewed. Mild hyponatremia at 129. Glucose significantly elevated at 397. Creatinine remains elevated at 1.89. AST 52, ALT 45, mildly elevated.  (all labs ordered are listed, but only abnormal results are displayed)  Labs Reviewed  SURGICAL PCR SCREEN - Abnormal; Notable for the following components:      Result Value   Staphylococcus aureus POSITIVE (*)    All other components within normal limits  GLUCOSE, CAPILLARY - Abnormal; Notable for the following components:   Glucose-Capillary 377 (*)    All other components within normal limits  COMPREHENSIVE METABOLIC PANEL - Abnormal; Notable for the following components:   Sodium 129 (*)    Chloride 97 (*)    CO2 21 (*)    Glucose, Bld 397 (*)    BUN 21 (*)    Creatinine, Ser 1.89 (*)    AST 52 (*)    ALT 45 (*)    GFR calc non Af Amer 38 (*)    GFR calc Af Amer 44 (*)    All other components within normal limits  TYPE AND SCREEN     IMAGES: CT T/L spine 11/29/18: IMPRESSION: Thoracic spine: 1. Spondylosis from T7-T8 through T9-T10 with moderate spinal canal stenosis and mild cord flattening at T9-T10. Lumbar spine: 1. Severe facet arthropathy at L4-L5 with grade 1 anterolisthesis. Instability with standing, flexion, and extension. 2. Unchanged prominent epidural lipomatosis in the lumbar spine with moderate to severe thecal sac compression at L4-L5 and L5-S1. 3. Hepatic steatosis. 4. Punctate nonobstructive right nephrolithiasis.   EKG: 09/19/18: Normal sinus rhythm with sinus arrhythmia Moderate voltage criteria for LVH, may be normal variant Lateral infarct , age undetermined Inferior infarct , age undetermined Abnormal ECG No significant change since last tracing Confirmed by Wandra Arthurs [phone]) on 09/19/2018 6:49:41 PM - Q waves in V5-6 appear new (low voltage QRS in V4) when compared to  tracings from 08/17/17 and 10/15/16--although small q wave in V6 noted on post-infusion stress EKG from 08/24/17 (in [place])   CV: Nuclear stress test 08/24/17: Study Highlights:  Nuclear stress EF: 53%.  There was no ST segment deviation noted during stress.  This is a low risk study.  The left ventricular ejection fraction is mildly decreased (45-54%). Attenuation of the inferior and inferior lateral wall with normal wall motion no ischemia EF 53% Most consistent with artifact low risk study   Echo 08/24/17: Study Conclusions - Left ventricle: The cavity size was normal. Systolic function was   normal. Wall motion was normal; there were no regional wall   motion abnormalities. Doppler parameters are consistent with   abnormal left ventricular relaxation (grade 1 diastolic   dysfunction). Doppler parameters are consistent with   indeterminate ventricular filling pressure. - Aortic valve: Transvalvular velocity was within the normal range.   There was no stenosis. There was no regurgitation. - Mitral valve: Transvalvular velocity was within the normal range.   There was no evidence for stenosis. There was no regurgitation. - Right ventricle: The cavity size was normal. Wall thickness was   normal. Systolic function was normal. - Atrial septum: No defect or patent  foramen ovale was identified   by color flow Doppler. - Tricuspid valve: There was trivial regurgitation. - Pulmonary arteries: Systolic pressure was within the normal   range. PA peak pressure: 24 mm Hg (S).    Past Medical History:  Diagnosis Date  . Acid reflux   . Arthritis   . Chronic headaches    on cymbalta  . Cirrhosis ([place])   . Colon polyps   . Complication of anesthesia    problems waking up from anesthesia  . Depression    on cymbalta  . Diabetes mellitus with neuropathy ([place])    borderline per primary MD  . Diverticulitis 03/2013  . Eczema   . Elevated LFTs   . Fatty liver   . History of kidney  stones   . Hypertension   . Insomnia 04/26/2013  . Kidney stone   . Morbid obesity ([place])   . Neuromuscular disorder ([facility])    neuropathy  . Neuropathy   . Post-traumatic hydrocephalus ([facility])    s/p shunts x 2 (first got infected )  . Psoriasis    sees Dr [name]  . Psoriatic arthritis ([place])   . 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 Sleep apnea    no CPAP     . Stomach ulcer   . Testosterone deficiency 04/28/2011    Past Surgical History:  Procedure Laterality Date  . BACK SURGERY  1980  . CHOLECYSTECTOMY N/A 08/25/2017   Procedure: LAPAROSCOPIC CHOLECYSTECTOMY WITH INTRAOPERATIVE CHOLANGIOGRAM;  Surgeon: [name], MD;  Location: [facility];  Service: General;  Laterality: N/A;  . COLONOSCOPY    . SHOULDER SURGERY Left 2010  . TOE SURGERY Left 2018  . TONSILLECTOMY     as a child  . TOTAL HIP ARTHROPLASTY Left 2011  . VENTRICULOPERITONEAL SHUNT  2007   x2    MEDICATIONS: . Adalimumab (HUMIRA PEN) 40 MG/0.8ML PNKT  . atorvastatin (LIPITOR) 10 MG tablet  . Blood Glucose Monitoring Suppl (FREESTYLE FREEDOM LITE) w/Device KIT  . carvedilol (COREG) 12.5 MG tablet  . DULoxetine (CYMBALTA) 60 MG capsule  . EPINEPHrine (EPIPEN 2-PAK) 0.3 mg/0.3 mL IJ SOAJ injection  . fenofibrate micronized (LOFIBRA) 134 MG capsule  . Ferrous Sulfate (IRON) 325 (65 Fe) MG TABS  . gabapentin (NEURONTIN) 600 MG tablet  . Galcanezumab-gnlm (EMGALITY) 120 MG/ML SOAJ  . glimepiride (AMARYL) 1 MG tablet  . glucose blood (ACCU-CHEK GUIDE) test strip  . Insulin Lispro Prot & Lispro (HUMALOG MIX 75/25 KWIKPEN) (75-25) 100 UNIT/ML Kwikpen  . Insulin Pen Needle 31G X 5 MM MISC  . Lancets (FREESTYLE) lancets  . levETIRAcetam (KEPPRA) 750 MG tablet  . metFORMIN (GLUCOPHAGE) 1000 MG tablet  . omeprazole (PRILOSEC) 40 MG capsule  . Semaglutide (RYBELSUS) 3 MG TABS  . triamcinolone cream (KENALOG) 0.1 %  . TRULICITY 1.5 LI/1.0VU SOPN   . 0.9 %  sodium chloride  infusion    Myra Gianotti, PA-C Surgical Short Stay/Anesthesiology [facility] Phone [phone] [facility] Phone [phone] 01/16/2019 6:07 PM

## 2019-01-16 NOTE — Progress Notes (Signed)
PCP - Kathlene November Endocrinologist-Dr. Valeda Malm Cardiologist - Dr. Agustin Cree  Chest x-ray - 09/19/18 EKG - 09/20/18 Stress Test - 08/24/17 ECHO - 08/24/17 Cardiac Cath - denies  Sleep Study - 2016 CPAP - does not use- "can't tolerate"  Fasting Blood Sugar - 300-340 Checks Blood Sugar 3-4 times a day   Aspirin Instructions: Patient instructed to hold all Aspirin, NSAID's, herbal medications, fish oil and vitamins 7 days prior to surgery.   Anesthesia review: a1c 11.0- order for DM coordinator consult DOS put in. Patient has just started new medication regimen within the last week, reviewed with patient, and that if CBG's are over 250 on DOS, there is a possibility of his case being canceled.   Patient denies shortness of breath, fever, cough and chest pain at PAT appointment   Patient verbalized understanding of instructions that were given to them at the PAT appointment. Patient was also instructed that they will need to review over the PAT instructions again at home before surgery.

## 2019-01-17 ENCOUNTER — Encounter: Payer: Self-pay | Admitting: Internal Medicine

## 2019-01-17 ENCOUNTER — Ambulatory Visit (INDEPENDENT_AMBULATORY_CARE_PROVIDER_SITE_OTHER): Payer: 59 | Admitting: Endocrinology

## 2019-01-17 ENCOUNTER — Encounter: Payer: Self-pay | Admitting: Endocrinology

## 2019-01-17 DIAGNOSIS — E1165 Type 2 diabetes mellitus with hyperglycemia: Secondary | ICD-10-CM

## 2019-01-17 MED ORDER — RYBELSUS 7 MG PO TABS: 1.0000 | ORAL_TABLET | Freq: Every day | ORAL | 3 refills | Status: DC

## 2019-01-17 MED FILL — RYBELSUS 7 MG TABS: 7 | 30 days supply | Qty: 30 | Fill #0

## 2019-01-17 NOTE — Progress Notes (Signed)
Patient ID: James [name], male   DOB: 1956-03-10, 63 y.o.   MRN: [id number]           Reason for Appointment: Follow-up for Type 2 Diabetes  Today's office visit was provided via telemedicine using a telephone call to the patient Patient has been explained the limitations of evaluation and management by telemedicine and the availability of in person appointments.  The patient understood the limitations and agreed to proceed. Patient also understood that the telehealth visit is billable. . Location of the patient: Home . Location of the provider: Office Only the patient and myself were participating in the encounter  Referring physician: Larose [name]  History of Present Illness:          Date of diagnosis of type 2 diabetes mellitus:  ?  2014      Background history:  He is not clear how his diabetes was diagnosed, likely on routine testing. Initially had been treated with metformin and also tried on Amaryl Patient had progressive increase in his blood sugars since 1/17 with stopping metformin and being on the regimen of Amaryl and Januvia, he thinks his blood sugars went up to 601.  He was then started on basal bolus insulin   Recent history:   INSULIN regimen: Humalog mix 75/25, 15 units twice daily  Non-insulin hypoglycemic drugs the patient is taking are: Rybelsus 3 mg daily, Amaryl 43m  His A1c is recently 11% which is higher than he has had in the last 4 years Last A1c was 6.8  Current management, blood sugar patterns and problems identified:  He was started on Rybelsus as a trial when he was having blood sugars over 200 about 3 weeks ago  However because of progressively rising blood sugars and need to stop metformin for renal dysfunction he has been started on insulin  He has been on Humalog mix insulin before breakfast and dinner, initially started on 10 units  However with continuing high sugars he was told to go up 5 units  Currently with 15 units twice daily before  breakfast and dinner his blood sugars are still markedly elevated and frequently over 300 throughout the day.  Lab glucose recently was 397  He has started checking his blood sugars more often also per  He was told to increase his Rybelsus to 2 tablets after 15 days but he has not done so  Has not had any steroids of any kind recently  Also because of his back pain he has not had any physical activity  He has been told to try improve his diet consistently but he will still drink some sweet tea  Has no difficulty with doing the injection process for insulin  Side  effects from medications have been: None  Compliance with the medical regimen: Fair  Glucose monitoring:  done up to 3 times a day         Glucometer:  Accu-Chek Blood Glucose readings   PRE-MEAL Fasting Lunch Dinner Bedtime Overall  Glucose range:  290-338  366-463  300-478  346-419   Mean/median:         Self-care:   Meal times are:  Breakfast may be skipped.  Lunch: 2-3 PM Dinner: 5-6 PM    Dietician visit, most recent:09/2015               CDE consultation: 12/2018  Weight history:  Wt Readings from Last 3 Encounters:  01/16/19 261 lb 12.8 oz (118.8 kg)  12/28/18 267 lb 9.6  oz (121.4 kg)  11/16/18 260 lb (117.9 kg)    Glycemic control:   Lab Results  Component Value Date   HGBA1C 11.0 (A) 12/28/2018   HGBA1C 6.8 (H) 05/03/2018   HGBA1C 6.6 (H) 01/28/2018   Lab Results  Component Value Date   MICROALBUR 3.4 (H) 12/28/2018   LDLCALC 137 (H) 09/07/2016   CREATININE 1.89 (H) 01/16/2019    Abstract on 01/17/2019  Component Date Value Ref Range Status  . Hemoglobin 01/12/2019 12.8* 13.5 - 17.5 Final  . HCT 01/12/2019 39* 41 - 53 Final  . Neutrophils Absolute 01/12/2019 5   Final  . Platelets 01/12/2019 189  150 - 399 Final  . WBC 01/12/2019 7.8   Final  . Glucose 01/12/2019 372   Final  . BUN 01/12/2019 26* 4 - 21 Final  . Creatinine 01/12/2019 2.0* 0.6 - 1.3 Final  . Potassium 01/12/2019 5.2   3.4 - 5.3 Final  . Sodium 01/12/2019 131* 137 - 147 Final  Hospital Outpatient Visit on 01/16/2019  Component Date Value Ref Range Status  . SARS Coronavirus 2 01/16/2019 NEGATIVE  NEGATIVE Final   Comment: (NOTE) SARS-CoV-2 target nucleic acids are NOT DETECTED. The SARS-CoV-2 RNA is generally detectable in upper and lower respiratory specimens during the acute phase of infection. Negative results do not preclude SARS-CoV-2 infection, do not rule out co-infections with other pathogens, and should not be used as the sole basis for treatment or other patient management decisions. Negative results must be combined with clinical observations, patient history, and epidemiological information. The expected result is Negative. Fact Sheet for Patients: SugarRoll.be Fact Sheet for Healthcare Providers: https://www.woods-mathews.com/ This test is not yet approved or cleared by the [place] FDA and  has been authorized for detection and/or diagnosis of SARS-CoV-2 by FDA under an Emergency Use Authorization (EUA). This EUA will remain  in effect (meaning this test can be used) for the duration of the COVID-19 declaration under Section 56                          4(b)(1) of the Act, 21 U.S.C. section 360bbb-3(b)(1), unless the authorization is terminated or revoked sooner. Performed at [facility], [place] [address], [place], [place] [address]   Hospital Outpatient Visit on 01/16/2019  Component Date Value Ref Range Status  . Glucose-Capillary 01/16/2019 377* 70 - 99 mg/dL Final  . MRSA, PCR 01/16/2019 NEGATIVE  NEGATIVE Final  . Staphylococcus aureus 01/16/2019 POSITIVE* NEGATIVE Final   Comment: (NOTE) The Xpert SA Assay (FDA approved for NASAL specimens in patients 56 years of age and older), is one component of a comprehensive surveillance program. It is not intended to diagnose infection nor to guide or monitor treatment. Performed at  [facility], [place] [address], [place], [place] [address]   . ABO/RH(D) 01/16/2019 B POS   Final  . Antibody Screen 01/16/2019 NEG   Final  . Sample Expiration 01/16/2019 01/30/2019,2359   Final  . Extend sample reason 01/16/2019    Final                   Value:NO TRANSFUSIONS OR PREGNANCY IN THE PAST 3 MONTHS Performed at [facility], [place] [address] [place], [place] [address]   . Sodium 01/16/2019 129* 135 - 145 mmol/L Final  . Potassium 01/16/2019 4.6  3.5 - 5.1 mmol/L Final  . Chloride 01/16/2019 97* 98 - 111 mmol/L Final  .  CO2 01/16/2019 21* 22 - 32 mmol/L Final  . Glucose, Bld 01/16/2019 397* 70 - 99 mg/dL Final  . BUN 01/16/2019 21* 6 - 20 mg/dL Final  . Creatinine, Ser 01/16/2019 1.89* 0.61 - 1.24 mg/dL Final  . Calcium 01/16/2019 9.2  8.9 - 10.3 mg/dL Final  . Total Protein 01/16/2019 7.6  6.5 - 8.1 g/dL Final  . Albumin 01/16/2019 3.6  3.5 - 5.0 g/dL Final  . AST 01/16/2019 52* 15 - 41 U/L Final  . ALT 01/16/2019 45* 0 - 44 U/L Final  . Alkaline Phosphatase 01/16/2019 75  38 - 126 U/L Final  . Total Bilirubin 01/16/2019 1.1  0.3 - 1.2 mg/dL Final  . GFR calc non Af Amer 01/16/2019 38* >60 mL/min Final  . GFR calc Af Amer 01/16/2019 44* >60 mL/min Final  . Anion gap 01/16/2019 11  5 - 15 Final   Performed at [facility], [place] [address], [place], [place] [address]       Allergies as of 01/17/2019      Reactions   Bee Venom Swelling   Hydrocodone-homatropine Other (See Comments)   Depressed feeling   Morphine And Related Other (See Comments)   Hallucinations, back in the 80s. States has taken vicodin before w/o problems    Sulfa Drugs Cross Reactors Rash      Medication List       Accurate as of January 17, 2019 11:31 AM. If you have any questions, ask your nurse or doctor.        Adalimumab 40 MG/0.8ML Pnkt Commonly known as: Humira Pen Inject 0.8 mLs into the skin every 14 (fourteen) days. Inject 40 mg (0.8 ml) under the skin every other  week.   atorvastatin 10 MG tablet Commonly known as: LIPITOR TAKE 1 TABLET (10 MG TOTAL) BY MOUTH DAILY.   carvedilol 12.5 MG tablet Commonly known as: COREG Take 1 tablet (12.5 mg total) by mouth 2 (two) times daily with a meal.   DULoxetine 60 MG capsule Commonly known as: CYMBALTA Take 2 capsules (120 mg total) by mouth daily.   EPINEPHrine 0.3 mg/0.3 mL Soaj injection Commonly known as: EpiPen 2-Pak Inject 0.3 mLs (0.3 mg total) into the muscle once as needed for up to 1 dose.   fenofibrate micronized 134 MG capsule Commonly known as: LOFIBRA Take 1 tablet by mouth three times weekly. What changed:   how much to take  how to take this  when to take this  additional instructions   FreeStyle Freedom Lite w/Device Kit Use to check blood sugars 3 times daily. Dx code: E11.9   freestyle lancets Use to check blood sugar 3 times daily. Dx code E11.9   gabapentin 600 MG tablet Commonly known as: NEURONTIN Take 1 tablet (600 mg total) by mouth 2 (two) times daily.   Galcanezumab-gnlm 120 MG/ML Soaj Commonly known as: Emgality Inject 1 pen into the skin every 28 (twenty-eight) days.   glimepiride 1 MG tablet Commonly known as: AMARYL TAKE 1 TABLET (1 MG TOTAL) BY MOUTH DAILY BEFORE SUPPER.   glucose blood test strip Commonly known as: Accu-Chek Guide Use to test blood sugar three times daily   Insulin Lispro Prot & Lispro (75-25) 100 UNIT/ML Kwikpen Commonly known as: HumaLOG Mix 75/25 KwikPen 10 units before meals twice daily or as directed What changed: additional instructions   Insulin Pen Needle 31G X 5 MM Misc Use for insulin pen twice a day   Iron 325 (65 Fe) MG  Tabs Take 1 tablet (325 mg total) by mouth 2 (two) times daily. Take on a empty stomach   levETIRAcetam 750 MG tablet Commonly known as: KEPPRA TAKE 1 TABLET (750 MG TOTAL) BY MOUTH 2 (TWO) TIMES DAILY.   metFORMIN 1000 MG tablet Commonly known as: GLUCOPHAGE Take 1,000 mg by mouth daily  with supper.   omeprazole 40 MG capsule Commonly known as: PRILOSEC Take 1 capsule (40 mg total) by mouth 2 (two) times daily before a meal.   Rybelsus 3 MG Tabs Generic drug: Semaglutide Take 3 mg by mouth daily before breakfast.   triamcinolone cream 0.1 % Commonly known as: KENALOG Apply 1 application topically daily as needed (rash).   Trulicity 1.5 EY/8.1KG Sopn Generic drug: Dulaglutide Inject 1.5 mg into the skin once a week.       Allergies:  Allergies  Allergen Reactions  . Bee Venom Swelling  . Hydrocodone-Homatropine Other (See Comments)    Depressed feeling  . Morphine And Related Other (See Comments)    Hallucinations, back in the 80s. States has taken vicodin before w/o problems   . Sulfa Drugs Cross Reactors Rash    Past Medical History:  Diagnosis Date  . Acid reflux   . Arthritis   . Chronic headaches    on cymbalta  . Cirrhosis ([place])   . Colon polyps   . Complication of anesthesia    problems waking up from anesthesia  . Depression    on cymbalta  . Diabetes mellitus with neuropathy ([facility])   . Diverticulitis 03/2013  . Eczema   . Elevated LFTs   . Fatty liver   . History of kidney stones   . Hypertension   . Insomnia 04/26/2013  . Kidney stone   . Morbid obesity ([place])   . Neuromuscular disorder ([facility])    neuropathy  . Neuropathy   . Post-traumatic hydrocephalus ([facility])    s/p shunts x 2 (first got infected )  . Psoriasis    sees Dr [name]  . Psoriatic arthritis ([facility])   . 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 Sleep apnea    no CPAP     . Stomach ulcer   . Testosterone deficiency 04/28/2011    Past Surgical History:  Procedure Laterality Date  . BACK SURGERY  1980  . CHOLECYSTECTOMY N/A 08/25/2017   Procedure: LAPAROSCOPIC CHOLECYSTECTOMY WITH INTRAOPERATIVE CHOLANGIOGRAM;  Surgeon: [name], MD;  Location: [facility];  Service: General;  Laterality: N/A;  . COLONOSCOPY    . SHOULDER SURGERY  Left 2010  . TOE SURGERY Left 2018  . TONSILLECTOMY     as a child  . TOTAL HIP ARTHROPLASTY Left 2011  . VENTRICULOPERITONEAL SHUNT  2007   x2    Family History  Problem Relation Age of Onset  . Other Mother   . Lung cancer Father        alive, former smoker   . Heart disease Brother        MI age 87  . Other Brother        Murdered  . Down syndrome Son   . Diabetes Neg Hx   . Prostate cancer Neg Hx   . Colon cancer Neg Hx   . Stomach cancer Neg Hx   . Pancreatic cancer Neg Hx   . Liver disease Neg Hx     Social History:  reports that he has never smoked. He has never used smokeless tobacco. He reports  that he does not drink alcohol or use drugs.    Review of Systems     HYPERTENSION: Blood pressure  is controlled on carvedilol Followed by PCP   BP Readings from Last 3 Encounters:  01/16/19 129/78  12/28/18 132/82  11/29/18 131/76    Lipid history: He is on Lipitor 10 mg with control of LDL previously Is on fenofibrate for triglyceride treatment, has increased triglycerides usually  No recent labs available   Lab Results  Component Value Date   CHOL 170 05/03/2018   HDL 43.00 05/03/2018   LDLCALC 137 (H) 09/07/2016   LDLDIRECT 89.0 05/03/2018   TRIG 332.0 (H) 05/03/2018   CHOLHDL 4 05/03/2018            Most recent foot exam: 08/2017  He has symptomatic painful neuropathy and is taking gabapentin 600 mg twice a day and  Cymbalta Also has difficulty with balance Has been followed by neurologist    Review of Systems    Physical Examination:  There were no vitals taken for this visit.      ASSESSMENT:  Diabetes type 2 with obesity  See history of present illness for discussion of current diabetes management, blood sugar patterns and problems identified  Currently on a regimen of metformin, Trulicity and low-dose Amaryl  A1c is 11% which is markedly higher than usual  He is appearing to be insulin deficient now with marked increase in  blood sugars This is without any exposure to steroids reportedly Also may be getting higher sugars because of not taking metformin Currently with a total of 30 units of insulin daily his blood sugars appear to be averaging over 300 Although he does need to lose weight with exercise and better diet he appears to be needing much more insulin with his insulin resistance He can do a little better with his diet and eliminate sweet tea completely  Discussed need to bring his blood sugars are at least below 200 over the next few days This will have to be done with marked increase in insulin doses Also he has not increased his Rybelsus to 7 mg as directed Currently his lumbar spine surgery appears to be canceled because of hyperglycemia   PLAN:   Trial of Rybelsus 7 mg daily, he will use up his current prescription with taking 2 tablets daily in the morning Increase insulin to 40 units in the morning and 25 in the evening Continue checking blood sugars 2-3 times a day at least Follow-up in 1 week Also consider freestyle libre sensor when he comes in to simplify his monitoring Cut back on all sweet drinks completely and increase fluid intake   There are no Patient Instructions on file for this visit.    Elayne Snare 01/17/2019, 11:31 AM

## 2019-01-18 ENCOUNTER — Telehealth: Payer: Self-pay

## 2019-01-18 NOTE — Telephone Encounter (Signed)
Surgical clearance form received from [facility]- per PCP- Pt must be cleared by nephrology before they can proceed. Form faxed back to ATTN: Jessica at [phone]. Form sent for scanning.

## 2019-01-18 NOTE — Telephone Encounter (Signed)
Fax confirmation received. 

## 2019-01-19 ENCOUNTER — Inpatient Hospital Stay (HOSPITAL_COMMUNITY): Admission: RE | Admit: 2019-01-19 | Payer: 59 | Source: Home / Self Care | Admitting: Neurosurgery

## 2019-01-19 ENCOUNTER — Encounter (HOSPITAL_COMMUNITY): Admission: RE | Payer: Self-pay | Source: Home / Self Care

## 2019-01-19 SURGERY — POSTERIOR LUMBAR FUSION 2 LEVEL
Anesthesia: General | Site: Back

## 2019-01-21 ENCOUNTER — Encounter (HOSPITAL_BASED_OUTPATIENT_CLINIC_OR_DEPARTMENT_OTHER): Payer: Self-pay | Admitting: *Deleted

## 2019-01-21 ENCOUNTER — Emergency Department (HOSPITAL_BASED_OUTPATIENT_CLINIC_OR_DEPARTMENT_OTHER)
Admission: EM | Admit: 2019-01-21 | Discharge: 2019-01-21 | Disposition: A | Discharge status: Home / Self Care | Payer: 59 | Attending: Emergency Medicine | Admitting: Emergency Medicine

## 2019-01-21 ENCOUNTER — Other Ambulatory Visit: Payer: Self-pay

## 2019-01-21 DIAGNOSIS — R42 Dizziness and giddiness: Secondary | ICD-10-CM | POA: Diagnosis not present

## 2019-01-21 DIAGNOSIS — Z79899 Other long term (current) drug therapy: Secondary | ICD-10-CM | POA: Insufficient documentation

## 2019-01-21 DIAGNOSIS — Z96642 Presence of left artificial hip joint: Secondary | ICD-10-CM | POA: Diagnosis not present

## 2019-01-21 DIAGNOSIS — E1165 Type 2 diabetes mellitus with hyperglycemia: Secondary | ICD-10-CM | POA: Diagnosis not present

## 2019-01-21 DIAGNOSIS — Z794 Long term (current) use of insulin: Secondary | ICD-10-CM | POA: Insufficient documentation

## 2019-01-21 DIAGNOSIS — I10 Essential (primary) hypertension: Secondary | ICD-10-CM | POA: Insufficient documentation

## 2019-01-21 DIAGNOSIS — R739 Hyperglycemia, unspecified: Secondary | ICD-10-CM

## 2019-01-21 LAB — CBC
HCT: 37 % — ABNORMAL LOW (ref 39.0–52.0)
Hemoglobin: 11.6 g/dL — ABNORMAL LOW (ref 13.0–17.0)
MCH: 27.2 pg (ref 26.0–34.0)
MCHC: 31.4 g/dL (ref 30.0–36.0)
MCV: 86.7 fL (ref 80.0–100.0)
Platelets: 152 10*3/uL (ref 150–400)
RBC: 4.27 MIL/uL (ref 4.22–5.81)
RDW: 16.1 % — ABNORMAL HIGH (ref 11.5–15.5)
WBC: 4.5 10*3/uL (ref 4.0–10.5)
nRBC: 0 % (ref 0.0–0.2)

## 2019-01-21 LAB — URINALYSIS, ROUTINE W REFLEX MICROSCOPIC
Bilirubin Urine: NEGATIVE
Glucose, UA: >=500 mg/dL — AB
Hgb urine dipstick: NEGATIVE
Ketones, ur: NEGATIVE mg/dL
Leukocytes,Ua: NEGATIVE
Nitrite: NEGATIVE
Protein, ur: NEGATIVE mg/dL
Specific Gravity, Urine: 1.01 (ref 1.005–1.030)
pH: 6 (ref 5.0–8.0)

## 2019-01-21 LAB — POCT I-STAT EG7
Acid-base deficit: 1 mmol/L (ref 0.0–2.0)
Bicarbonate: 25.7 mmol/L (ref 20.0–28.0)
Calcium, Ion: 1.24 mmol/L (ref 1.15–1.40)
HCT: 37 % — ABNORMAL LOW (ref 39.0–52.0)
Hemoglobin: 12.6 g/dL — ABNORMAL LOW (ref 13.0–17.0)
O2 Saturation: 52 %
Patient temperature: 98.4
Potassium: 4.8 mmol/L (ref 3.5–5.1)
Sodium: 133 mmol/L — ABNORMAL LOW (ref 135–145)
TCO2: 27 mmol/L (ref 22–32)
pCO2, Ven: 50.1 mmHg (ref 44.0–60.0)
pH, Ven: 7.318 (ref 7.250–7.430)
pO2, Ven: 30 mmHg — CL (ref 32.0–45.0)

## 2019-01-21 LAB — URINALYSIS, MICROSCOPIC (REFLEX): RBC / HPF: NONE SEEN RBC/hpf (ref 0–5)

## 2019-01-21 LAB — BASIC METABOLIC PANEL
Anion gap: 10 (ref 5–15)
BUN: 23 mg/dL — ABNORMAL HIGH (ref 6–20)
CO2: 23 mmol/L (ref 22–32)
Calcium: 8.8 mg/dL — ABNORMAL LOW (ref 8.9–10.3)
Chloride: 97 mmol/L — ABNORMAL LOW (ref 98–111)
Creatinine, Ser: 2.01 mg/dL — ABNORMAL HIGH (ref 0.61–1.24)
GFR calc Af Amer: 41 mL/min — ABNORMAL LOW (ref >60)
GFR calc non Af Amer: 35 mL/min — ABNORMAL LOW (ref >60)
Glucose, Bld: 663 mg/dL (ref 70–99)
Potassium: 4.6 mmol/L (ref 3.5–5.1)
Sodium: 130 mmol/L — ABNORMAL LOW (ref 135–145)

## 2019-01-21 LAB — CBG MONITORING, ED
Glucose-Capillary: 572 mg/dL (ref 70–99)
Glucose-Capillary: 496 mg/dL — ABNORMAL HIGH (ref 70–99)
Glucose-Capillary: 412 mg/dL — ABNORMAL HIGH (ref 70–99)

## 2019-01-21 MED ORDER — INSULIN ASPART 100 UNIT/ML ~~LOC~~ SOLN
15.0000 [IU] | Freq: Once | SUBCUTANEOUS | Status: AC
  Administered 2019-01-21: 15 [IU] via SUBCUTANEOUS
  Filled 2019-01-21: qty 1

## 2019-01-21 MED ORDER — INSULIN ASPART 100 UNIT/ML ~~LOC~~ SOLN
10.0000 [IU] | Freq: Once | SUBCUTANEOUS | Status: AC
  Administered 2019-01-21: 10 [IU] via SUBCUTANEOUS
  Filled 2019-01-21: qty 1

## 2019-01-21 MED ORDER — SODIUM CHLORIDE 0.9 % IV BOLUS
1000.0000 mL | Freq: Once | INTRAVENOUS | Status: AC
  Administered 2019-01-21: 1000 mL via INTRAVENOUS

## 2019-01-21 NOTE — ED Notes (Signed)
CBG [id number]

## 2019-01-21 NOTE — Discharge Instructions (Signed)
You should take your diabetes medicines as prescribed. You may use your humalog tonight   Avoid sodas or sugary drinks   Call your endocrinologist on Monday regarding adjusting your insulin regimen   Return to ER if you have fever, vomiting, severe abdominal pain, glucose > 600 or less than 60

## 2019-01-21 NOTE — ED Triage Notes (Signed)
Pt here with elevated blood sugar. States he was started on insulin 11 days ago. Today his meter read "high". Pt c/o headache

## 2019-01-21 NOTE — ED Provider Notes (Signed)
[facility] EMERGENCY DEPARTMENT Provider Note   CSN: [id number] Arrival date & time: 01/21/19  1624    History   Chief Complaint Chief Complaint  Patient presents with  . Hyperglycemia    HPI James [name] is a 63 y.o. male history of cirrhosis, depression, uncontrolled diabetes, here presenting with elevated blood sugar, dizziness, polydipsia.  Patient states that he was told he had diabetes and ended up seeing an endocrinologist about 2 weeks ago.  He was started on insulin and multiple oral medicines to control his blood sugar.  He states that his sugar is still elevated around 300 in the morning and 400-500 at night.  His endocrinologist increased his insulin dose recently.  He states that despite that, his blood sugar is still elevated and today he read high.  He states that he has been lightheaded and dizzy and is been urinating a lot.  Has any fevers or chills or vomiting or abdominal pain.  Patient denies any confusion.  He was supposed to get back surgery several days ago but was canceled due to his uncontrolled blood sugar.     The history is provided by the patient.    Past Medical History:  Diagnosis Date  . Acid reflux   . Arthritis   . Chronic headaches    on cymbalta  . Cirrhosis ([place])   . Colon polyps   . Complication of anesthesia    problems waking up from anesthesia  . Depression    on cymbalta  . Diabetes mellitus with neuropathy ([place])   . Diverticulitis 03/2013  . Eczema   . Elevated LFTs   . Fatty liver   . History of kidney stones   . Hypertension   . Insomnia 04/26/2013  . Kidney stone   . Morbid obesity ([place])   . Neuromuscular disorder (HCC)    neuropathy  . Neuropathy   . Post-traumatic hydrocephalus (HCC)    s/p shunts x 2 (first got infected )  . Psoriasis    sees Dr [name]  . Psoriatic arthritis ([place])   . 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 Sleep apnea    no CPAP     . Stomach  ulcer   . Testosterone deficiency 04/28/2011    Patient Active Problem List   Diagnosis Date Noted  . Chronic migraine 10/24/2018  . Idiopathic intracranial hypertension 01/14/2017  . REM behavioral disorder 01/14/2017  . Liver cirrhosis secondary to NASH (nonalcoholic steatohepatitis) ([place]) 01/02/2016  . H/O craniotomy 05/07/2015  . Annual physical exam 04/08/2015  . PCP NOTES >>> 04/08/2015  . Hypersomnia with sleep apnea 01/28/2015  . Severe obesity (BMI >= 40) ([place]) 01/28/2015  . Obstructive hydrocephalus ([place]) 01/28/2015  . Chronic fatigue 01/28/2015  . Depression 09/04/2014  . OSA -- dx ~ 2012, cpap intolerant 09/04/2014  . Insomnia 04/26/2013  . Sigmoid diverticulitis 04/25/2013  . Diabetes with neuropathy 04/25/2013  . Presence of cerebrospinal fluid drainage device 07/28/2011  . Psoriatic arthritis ([place]) 04/28/2011  . Headache 04/28/2011  . GERD (gastroesophageal reflux disease) 04/28/2011  . Testosterone deficiency 04/28/2011    Past Surgical History:  Procedure Laterality Date  . BACK SURGERY  1980  . CHOLECYSTECTOMY N/A 08/25/2017   Procedure: LAPAROSCOPIC CHOLECYSTECTOMY WITH INTRAOPERATIVE CHOLANGIOGRAM;  Surgeon: [name], MD;  Location: [facility];  Service: General;  Laterality: N/A;  . COLONOSCOPY    . SHOULDER SURGERY Left 2010  . TOE SURGERY Left 2018  .  TONSILLECTOMY     as a child  . TOTAL HIP ARTHROPLASTY Left 2011  . VENTRICULOPERITONEAL SHUNT  2007   x2        Home Medications    Prior to Admission medications   Medication Sig Start Date End Date Taking? Authorizing Provider  Adalimumab (HUMIRA PEN) 40 MG/0.8ML PNKT Inject 0.8 mLs into the skin every 14 (fourteen) days. Inject 40 mg (0.8 ml) under the skin every other week. 08/22/18   Tresa Garter, MD  atorvastatin (LIPITOR) 10 MG tablet TAKE 1 TABLET (10 MG TOTAL) BY MOUTH DAILY. 08/31/18   Elayne Snare, MD  Blood Glucose Monitoring Suppl (FREESTYLE FREEDOM LITE) w/Device KIT Use to  check blood sugars 3 times daily. Dx code: E11.9 10/29/16   Elayne Snare, MD  carvedilol (COREG) 12.5 MG tablet Take 1 tablet (12.5 mg total) by mouth 2 (two) times daily with a meal. 06/27/18   Colon Branch, MD  DULoxetine (CYMBALTA) 60 MG capsule Take 2 capsules (120 mg total) by mouth daily. 07/21/18   Colon Branch, MD  EPINEPHrine (EPIPEN 2-PAK) 0.3 mg/0.3 mL IJ SOAJ injection Inject 0.3 mLs (0.3 mg total) into the muscle once as needed for up to 1 dose. 01/19/18   Colon Branch, MD  fenofibrate micronized (LOFIBRA) 134 MG capsule Take 1 tablet by mouth three times weekly. Patient taking differently: Take 134 mg by mouth 3 (three) times a week.  01/05/19   Elayne Snare, MD  Ferrous Sulfate (IRON) 325 (65 Fe) MG TABS Take 1 tablet (325 mg total) by mouth 2 (two) times daily. Take on a empty stomach 12/01/18   Colon Branch, MD  gabapentin (NEURONTIN) 600 MG tablet Take 1 tablet (600 mg total) by mouth 2 (two) times daily. 01/05/19   Paz, Alda Berthold, MD  Galcanezumab-gnlm [facility]) 120 MG/ML SOAJ Inject 1 pen into the skin every 28 (twenty-eight) days. 10/24/18   Sater, Nanine Means, MD  glimepiride (AMARYL) 1 MG tablet TAKE 1 TABLET (1 MG TOTAL) BY MOUTH DAILY BEFORE SUPPER. 12/06/18   Elayne Snare, MD  glucose blood (ACCU-CHEK GUIDE) test strip Use to test blood sugar three times daily 07/19/17   Elayne Snare, MD  Insulin Lispro Prot & Lispro (HUMALOG MIX 75/25 KWIKPEN) (75-25) 100 UNIT/ML Kwikpen 10 units before meals twice daily or as directed Patient taking differently: 15 units before meals twice daily or as directed 01/09/19   Elayne Snare, MD  Insulin Pen Needle 31G X 5 MM MISC Use for insulin pen twice a day 01/09/19   Elayne Snare, MD  Lancets (FREESTYLE) lancets Use to check blood sugar 3 times daily. Dx code E11.9 10/29/16   Elayne Snare, MD  levETIRAcetam (KEPPRA) 750 MG tablet TAKE 1 TABLET (750 MG TOTAL) BY MOUTH 2 (TWO) TIMES DAILY. 10/04/18   Sater, Nanine Means, MD  metFORMIN (GLUCOPHAGE) 1000 MG tablet Take 1,000 mg by  mouth daily with supper.     [provider]  omeprazole (PRILOSEC) 40 MG capsule Take 1 capsule (40 mg total) by mouth 2 (two) times daily before a meal. 05/23/18   Paz, Alda Berthold, MD  Semaglutide [facility]) 7 MG TABS Take 1 capsule by mouth daily before breakfast. 01/17/19   Elayne Snare, MD  triamcinolone cream (KENALOG) 0.1 % Apply 1 application topically daily as needed (rash).  08/22/18   [provider]    Family History Family History  Problem Relation Age of Onset  . Other Mother   . Lung  cancer Father        alive, former smoker   . Heart disease Brother        MI age 109  . Other Brother        Murdered  . Down syndrome Son   . Diabetes Neg Hx   . Prostate cancer Neg Hx   . Colon cancer Neg Hx   . Stomach cancer Neg Hx   . Pancreatic cancer Neg Hx   . Liver disease Neg Hx     Social History Social History   Tobacco Use  . Smoking status: Never Smoker  . Smokeless tobacco: Never Used  Substance Use Topics  . Alcohol use: No  . Drug use: No     Allergies   Bee venom, Hydrocodone-homatropine, Morphine and related, and Sulfa drugs cross reactors   Review of Systems Review of Systems  Genitourinary: Positive for frequency.  Neurological: Positive for dizziness.  All other systems reviewed and are negative.    Physical Exam Updated Vital Signs BP (!) 143/90   Pulse 72   Temp 98.4 F (36.9 C) (Oral)   Resp 17   Ht 5' 11"  (1.803 m)   Wt 121 kg   SpO2 99%   BMI 37.21 kg/m   Physical Exam Vitals signs and nursing note reviewed.  Constitutional:      Appearance: Normal appearance.  HENT:     Head: Normocephalic.     Nose: Nose normal.     Mouth/Throat:     Mouth: Mucous membranes are dry.  Eyes:     Extraocular Movements: Extraocular movements intact.     Pupils: Pupils are equal, round, and reactive to light.  Neck:     Musculoskeletal: Normal range of motion.  Cardiovascular:     Rate and Rhythm: Normal rate and regular rhythm.      Pulses: Normal pulses.  Pulmonary:     Effort: Pulmonary effort is normal.     Breath sounds: Normal breath sounds.  Abdominal:     General: Abdomen is flat.     Palpations: Abdomen is soft.  Musculoskeletal: Normal range of motion.  Skin:    General: Skin is warm.     Capillary Refill: Capillary refill takes less than 2 seconds.  Neurological:     General: No focal deficit present.     Mental Status: He is alert and oriented to person, place, and time.     Cranial Nerves: No cranial nerve deficit.     Sensory: No sensory deficit.     Motor: No weakness.     Coordination: Coordination normal.  Psychiatric:        Mood and Affect: Mood normal.        Behavior: Behavior normal.      ED Treatments / Results  Labs (all labs ordered are listed, but only abnormal results are displayed) Labs Reviewed  BASIC METABOLIC PANEL - Abnormal; Notable for the following components:      Result Value   Sodium 130 (*)    Chloride 97 (*)    Glucose, Bld 663 (*)    BUN 23 (*)    Creatinine, Ser 2.01 (*)    Calcium 8.8 (*)    GFR calc non Af Amer 35 (*)    GFR calc Af Amer 41 (*)    All other components within normal limits  CBC - Abnormal; Notable for the following components:   Hemoglobin 11.6 (*)    HCT 37.0 (*)  RDW 16.1 (*)    All other components within normal limits  URINALYSIS, ROUTINE W REFLEX MICROSCOPIC - Abnormal; Notable for the following components:   Glucose, UA >=500 (*)    All other components within normal limits  URINALYSIS, MICROSCOPIC (REFLEX) - Abnormal; Notable for the following components:   Bacteria, UA RARE (*)    All other components within normal limits  CBG MONITORING, ED - Abnormal; Notable for the following components:   Glucose-Capillary 572 (*)    All other components within normal limits  CBG MONITORING, ED - Abnormal; Notable for the following components:   Glucose-Capillary 496 (*)    All other components within normal limits  POCT I-STAT  EG7 - Abnormal; Notable for the following components:   pO2, Ven 30.0 (*)    Sodium 133 (*)    HCT 37.0 (*)    Hemoglobin 12.6 (*)    All other components within normal limits  CBG MONITORING, ED - Abnormal; Notable for the following components:   Glucose-Capillary 412 (*)    All other components within normal limits  BLOOD GAS, VENOUS  CBG MONITORING, ED    EKG None  Radiology No results found.  Procedures Procedures (including critical care time)  Medications Ordered in ED Medications  sodium chloride 0.9 % bolus 1,000 mL (0 mLs Intravenous Stopped 01/21/19 1754)  insulin aspart (novoLOG) injection 10 Units (10 Units Subcutaneous Given 01/21/19 1702)  sodium chloride 0.9 % bolus 1,000 mL (0 mLs Intravenous Stopped 01/21/19 1903)  insulin aspart (novoLOG) injection 15 Units (15 Units Subcutaneous Given 01/21/19 1835)     Initial Impression / Assessment and Plan / ED Course  I have reviewed the triage vital signs and the nursing notes.  Pertinent labs & imaging results that were available during my care of the patient were reviewed by me and considered in my medical decision making (see chart for details).       James [name] is a 63 y.o. male here with hyperglycemia, dizziness.  Patient has normal neuro exam currently.  Has no vomiting or fevers history is just DKA.  Will get chemistry and VBG.  Will hydrate patient and try subQ insulin and recheck CBG.  7:24 PM  Initial glucose was 663 on chemistry. Nl AG.  Patient was given 2 L and normal saline bolus as well as subcu insulin and blood sugars down to 410.  I told him that he can go home and take his nighttime Humalog.  He needs to call his endocrinologist on Monday regarding adjustment for his Humalog. He does drink some soda daily and I told him to stop drinking soda. Stable for discharge    Final Clinical Impressions(s) / ED Diagnoses   Final diagnoses:  None    ED Discharge Orders    None       Drenda Freeze, MD 01/21/19 1925

## 2019-01-23 ENCOUNTER — Other Ambulatory Visit: Payer: Self-pay

## 2019-01-23 ENCOUNTER — Encounter: Payer: Self-pay | Admitting: Cardiology

## 2019-01-23 ENCOUNTER — Telehealth: Payer: Self-pay | Admitting: Endocrinology

## 2019-01-23 ENCOUNTER — Ambulatory Visit (INDEPENDENT_AMBULATORY_CARE_PROVIDER_SITE_OTHER): Payer: 59 | Admitting: Cardiology

## 2019-01-23 VITALS — BP 152/82 | HR 72 | Ht 71.0 in | Wt 270.4 lb

## 2019-01-23 DIAGNOSIS — E1122 Type 2 diabetes mellitus with diabetic chronic kidney disease: Secondary | ICD-10-CM | POA: Diagnosis not present

## 2019-01-23 DIAGNOSIS — N182 Chronic kidney disease, stage 2 (mild): Secondary | ICD-10-CM

## 2019-01-23 DIAGNOSIS — I2510 Atherosclerotic heart disease of native coronary artery without angina pectoris: Secondary | ICD-10-CM

## 2019-01-23 DIAGNOSIS — G4733 Obstructive sleep apnea (adult) (pediatric): Secondary | ICD-10-CM | POA: Diagnosis not present

## 2019-01-23 HISTORY — DX: Atherosclerotic heart disease of native coronary artery without angina pectoris: I25.10

## 2019-01-23 MED FILL — BD PEN NDL MINI 31GX5MM: 31G X 5 MM | 50 days supply | Qty: 100 | Fill #0

## 2019-01-23 MED FILL — HUMALOG MIX 75-25 KWIKPEN: (75-25) 100 | 75 days supply | Qty: 15 | Fill #0

## 2019-01-23 NOTE — Telephone Encounter (Signed)
Patient called requesting to speak with James [name] in regards to an ED visit he had this weekend with blood sugars at 662.   Please Advise, Thanks

## 2019-01-23 NOTE — Telephone Encounter (Signed)
Pt was calling back to speak to Olen Cordial but he was on the phone so I let him know I would send him a message to call back when he has a moment

## 2019-01-23 NOTE — Addendum Note (Signed)
Addended by: Ashok Norris on: 01/23/2019 02:08 PM   Modules accepted: Orders

## 2019-01-23 NOTE — Progress Notes (Signed)
Cardiology Office Note:    Date:  01/23/2019   ID:  James [name], DOB [birth year], MRN [id number]  PCP:  Colon Branch, MD  Cardiologist:  Jenne Campus, MD    Referring MD: Colon Branch, MD   Chief Complaint  Patient presents with  . Pre-op Exam  I need to back surgery  History of Present Illness:    James [name] is a 63 y.o. male with multiple risk factors for coronary artery disease which include hypertension recently poorly controlled diabetes as well as dyslipidemia.  He is supposed to have a back surgery however when he went to see anesthesiologist EKG was done new changes has been identified he was referred back to [place] for evaluation.  I did see him a year ago when he was evaluated before gallbladder surgery.  He did have a stress test as well as echocardiogram both were normal and he went to surgery with no difficulties however in March she presented to hospital because of chest pain.  He ruled out for myocardial infarction he was discharged home.  At that time his EKG already showed Q waves in inferior lateral leads.  EKG done today showed similar finding.  There is no changes between EKG done in March and now.  He denies having any chest pain tightness squeezing pressure burning chest.  His ability to exercise is limited because of chronic back problem.   Past Medical History:  Diagnosis Date  . Acid reflux   . Arthritis   . Chronic headaches    on cymbalta  . Cirrhosis ([place])   . Colon polyps   . Complication of anesthesia    problems waking up from anesthesia  . Depression    on cymbalta  . Diabetes mellitus with neuropathy ([place])   . Diverticulitis 03/2013  . Eczema   . Elevated LFTs   . Fatty liver   . History of kidney stones   . Hypertension   . Insomnia 04/26/2013  . Kidney stone   . Morbid obesity ([place])   . Neuromuscular disorder (HCC)    neuropathy  . Neuropathy   . Post-traumatic hydrocephalus (HCC)    s/p shunts x 2 (first got infected )  .  Psoriasis    sees Dr [name]  . Psoriatic arthritis ([place])   . 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 Sleep apnea    no CPAP     . Stomach ulcer   . Testosterone deficiency 04/28/2011    Past Surgical History:  Procedure Laterality Date  . BACK SURGERY  1980  . CHOLECYSTECTOMY N/A 08/25/2017   Procedure: LAPAROSCOPIC CHOLECYSTECTOMY WITH INTRAOPERATIVE CHOLANGIOGRAM;  Surgeon: [name], MD;  Location: [facility];  Service: General;  Laterality: N/A;  . COLONOSCOPY    . SHOULDER SURGERY Left 2010  . TOE SURGERY Left 2018  . TONSILLECTOMY     as a child  . TOTAL HIP ARTHROPLASTY Left 2011  . VENTRICULOPERITONEAL SHUNT  2007   x2    Current Medications: Current Meds  Medication Sig  . Adalimumab (HUMIRA PEN) 40 MG/0.8ML PNKT Inject 0.8 mLs into the skin every 14 (fourteen) days. Inject 40 mg (0.8 ml) under the skin every other week.  Marland Kitchen atorvastatin (LIPITOR) 10 MG tablet TAKE 1 TABLET (10 MG TOTAL) BY MOUTH DAILY.  Marland Kitchen Blood Glucose Monitoring Suppl (FREESTYLE FREEDOM LITE) w/Device KIT Use to check blood sugars 3 times daily. Dx code: E11.9  . carvedilol (  COREG) 12.5 MG tablet Take 1 tablet (12.5 mg total) by mouth 2 (two) times daily with a meal.  . DULoxetine (CYMBALTA) 60 MG capsule Take 2 capsules (120 mg total) by mouth daily.  Marland Kitchen EPINEPHrine (EPIPEN 2-PAK) 0.3 mg/0.3 mL IJ SOAJ injection Inject 0.3 mLs (0.3 mg total) into the muscle once as needed for up to 1 dose.  . fenofibrate micronized (LOFIBRA) 134 MG capsule Take 1 tablet by mouth three times weekly. (Patient taking differently: Take 134 mg by mouth 3 (three) times a week. )  . Ferrous Sulfate (IRON) 325 (65 Fe) MG TABS Take 1 tablet (325 mg total) by mouth 2 (two) times daily. Take on a empty stomach  . gabapentin (NEURONTIN) 600 MG tablet Take 1 tablet (600 mg total) by mouth 2 (two) times daily.  . Galcanezumab-gnlm (EMGALITY) 120 MG/ML SOAJ Inject 1 pen into the skin every 28  (twenty-eight) days.  Marland Kitchen glimepiride (AMARYL) 1 MG tablet TAKE 1 TABLET (1 MG TOTAL) BY MOUTH DAILY BEFORE SUPPER.  Marland Kitchen glucose blood (ACCU-CHEK GUIDE) test strip Use to test blood sugar three times daily  . Insulin Lispro Prot & Lispro (HUMALOG MIX 75/25 KWIKPEN) (75-25) 100 UNIT/ML Kwikpen 10 units before meals twice daily or as directed (Patient taking differently: 15 units before meals twice daily or as directed)  . Insulin Pen Needle 31G X 5 MM MISC Use for insulin pen twice a day  . Lancets (FREESTYLE) lancets Use to check blood sugar 3 times daily. Dx code E11.9  . levETIRAcetam (KEPPRA) 750 MG tablet TAKE 1 TABLET (750 MG TOTAL) BY MOUTH 2 (TWO) TIMES DAILY.  Marland Kitchen omeprazole (PRILOSEC) 40 MG capsule Take 1 capsule (40 mg total) by mouth 2 (two) times daily before a meal.  . Semaglutide (RYBELSUS) 7 MG TABS Take 1 capsule by mouth daily before breakfast.  . triamcinolone cream (KENALOG) 0.1 % Apply 1 application topically daily as needed (rash).    Current Facility-Administered Medications for the 01/23/19 encounter (Office Visit) with Park Liter, MD  Medication  . 0.9 %  sodium chloride infusion     Allergies:   Bee venom, Hydrocodone-homatropine, Morphine and related, and Sulfa drugs cross reactors   Social History   Socioeconomic History  . Marital status: Married    Spouse name: [name]  . Number of children: 2  . Years of education: Not on file  . Highest education level: Not on file  Occupational History  . Occupation: disable   Social Needs  . Financial resource strain: Not on file  . Food insecurity    Worry: Not on file    Inability: Not on file  . Transportation needs    Medical: Not on file    Non-medical: Not on file  Tobacco Use  . Smoking status: Never Smoker  . Smokeless tobacco: Never Used  Substance and Sexual Activity  . Alcohol use: No  . Drug use: No  . Sexual activity: Yes    Partners: Female  Lifestyle  . Physical activity    Days per week:  Not on file    Minutes per session: Not on file  . Stress: Not on file  Relationships  . Social Herbalist on phone: Not on file    Gets together: Not on file    Attends religious service: Not on file    Active member of club or organization: Not on file    Attends meetings of clubs or organizations: Not on file  Relationship status: Not on file  Other Topics Concern  . 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     Family History: The patient's family history includes Down syndrome in his son; Heart disease in his brother; Lung cancer in his father; Other in his brother and mother. There is no history of Diabetes, Prostate cancer, Colon cancer, Stomach cancer, Pancreatic cancer, or Liver disease. ROS:   Please see the history of present illness.    All 14 point review of systems negative except as described per history of present illness  EKGs/Labs/Other Studies Reviewed:      Recent Labs: 06/10/2018: TSH 2.34 01/16/2019: ALT 45 01/21/2019: BUN 23; Creatinine, Ser 2.01; Hemoglobin 12.6; Platelets 152; Potassium 4.8; Sodium 133  Recent Lipid Panel    Component Value Date/Time   CHOL 170 05/03/2018 1048   TRIG 332.0 (H) 05/03/2018 1048   HDL 43.00 05/03/2018 1048   CHOLHDL 4 05/03/2018 1048   VLDL 66.4 (H) 05/03/2018 1048   LDLCALC 137 (H) 09/07/2016 1025   LDLDIRECT 89.0 05/03/2018 1048    Physical Exam:    VS:  BP (!) 152/82   Pulse 72   Ht _0  (1.803 m)   Wt 270 lb 6.4 oz (122.7 kg)   SpO2 98%   BMI 37.71 kg/m     Wt Readings from Last 3 Encounters:  01/23/19 270 lb 6.4 oz (122.7 kg)  01/21/19 266 lb 12.1 oz (121 kg)  01/16/19 261 lb 12.8 oz (118.8 kg)     GEN:  Well nourished, well developed in no acute distress HEENT: Normal NECK: No JVD; No carotid bruits LYMPHATICS: No  lymphadenopathy CARDIAC: RRR, no murmurs, no rubs, no gallops RESPIRATORY:  Clear to auscultation without rales, wheezing or rhonchi  ABDOMEN: Soft, non-tender, non-distended MUSCULOSKELETAL:  No edema; No deformity  SKIN: Warm and dry LOWER EXTREMITIES: no swelling NEUROLOGIC:  Alert and oriented x 3 PSYCHIATRIC:  Normal affect   ASSESSMENT:    1. Type 2 diabetes mellitus with stage 2 chronic kidney disease, without long-term current use of insulin ([place])   2. Coronary artery disease involving native coronary artery of native heart without angina pectoris   3. OSA -- dx ~ 2012, cpap intolerant    PLAN:    In order of problems listed above:  1. Abnormal EKG rising suspicion for inferior lateral wall myocardial infarction.  Asking to start taking baby aspirin every single day I do think surgery will happen within the next 7 days.  I asked him to have an echocardiogram done to assess left ventricular ejection fraction and degree of hypokinesis if he truly does 1.  We will continue statin therapy.  Future recommendation will depend on finding on the echocardiogram.  He may require cardiac catheterization. 2. 2.  Type 2 diabetes slightly is difficult to control he did apparently get hemoglobin A1c level he has been working hard with the endocrinologist trying to get this better. 3. Obstructive sleep apnea intolerant to CPAP mask.   Medication Adjustments/Labs and Tests Ordered: Current medicines are reviewed at length with the patient today.  Concerns regarding medicines are outlined above.  No orders of the defined types were placed in this encounter.  Medication changes: No orders of the defined types were placed in this encounter.   Signed, Park Liter,  MD, [facility] 01/23/2019 1:39 PM    [place]

## 2019-01-23 NOTE — Patient Instructions (Signed)
Medication Instructions:  Your physician recommends that you continue on your current medications as directed. Please refer to the Current Medication list given to you today.  If you need a refill on your cardiac medications before your next appointment, please call your pharmacy.   Lab work: None.  If you have labs (blood work) drawn today and your tests are completely normal, you will receive your results only by: Marland Kitchen MyChart Message (if you have MyChart) OR . A paper copy in the mail If you have any lab test that is abnormal or we need to change your treatment, we will call you to review the results.  Testing/Procedures: Your physician has requested that you have an echocardiogram. Echocardiography is a painless test that uses sound waves to create images of your heart. It provides your doctor with information about the size and shape of your heart and how well your heart's chambers and valves are working. This procedure takes approximately one hour. There are no restrictions for this procedure.    Follow-Up: At [facility], you and your health needs are our priority.  As part of our continuing mission to provide you with exceptional heart care, we have created designated Provider Care Teams.  These Care Teams include your primary Cardiologist (physician) and Advanced Practice Providers (APPs -  Physician Assistants and Nurse Practitioners) who all work together to provide you with the care you need, when you need it. You will need a follow up appointment in 1 months.  Please call our office 2 months in advance to schedule this appointment.  You may see No primary care provider on file. or another member of our [employer] Provider Team in [place]: Shirlee More, MD . Jyl Heinz, MD  Any Other Special Instructions Will Be Listed Below (If Applicable).   Echocardiogram An echocardiogram is a procedure that uses painless sound waves (ultrasound) to produce an image of the heart.  Images from an echocardiogram can provide important information about:  Signs of coronary artery disease (CAD).  Aneurysm detection. An aneurysm is a weak or damaged part of an artery wall that bulges out from the normal force of blood pumping through the body.  Heart size and shape. Changes in the size or shape of the heart can be associated with certain conditions, including heart failure, aneurysm, and CAD.  Heart muscle function.  Heart valve function.  Signs of a past heart attack.  Fluid buildup around the heart.  Thickening of the heart muscle.  A tumor or infectious growth around the heart valves. Tell a health care provider about:  Any allergies you have.  All medicines you are taking, including vitamins, herbs, eye drops, creams, and over-the-counter medicines.  Any blood disorders you have.  Any surgeries you have had.  Any medical conditions you have.  Whether you are pregnant or may be pregnant. What are the risks? Generally, this is a safe procedure. However, problems may occur, including:  Allergic reaction to dye (contrast) that may be used during the procedure. What happens before the procedure? No specific preparation is needed. You may eat and drink normally. What happens during the procedure?   An IV tube may be inserted into one of your veins.  You may receive contrast through this tube. A contrast is an injection that improves the quality of the pictures from your heart.  A gel will be applied to your chest.  A wand-like tool (transducer) will be moved over your chest. The gel will help to  transmit the sound waves from the transducer.  The sound waves will harmlessly bounce off of your heart to allow the heart images to be captured in real-time motion. The images will be recorded on a computer. The procedure may vary among health care providers and hospitals. What happens after the procedure?  You may return to your normal, everyday life,  including diet, activities, and medicines, unless your health care provider tells you not to do that. Summary  An echocardiogram is a procedure that uses painless sound waves (ultrasound) to produce an image of the heart.  Images from an echocardiogram can provide important information about the size and shape of your heart, heart muscle function, heart valve function, and fluid buildup around your heart.  You do not need to do anything to prepare before this procedure. You may eat and drink normally.  After the echocardiogram is completed, you may return to your normal, everyday life, unless your health care provider tells you not to do that. This information is not intended to replace advice given to you by your health care provider. Make sure you discuss any questions you have with your health care provider. Document Released: 06/12/2000 Document Revised: 10/06/2018 Document Reviewed: 07/18/2016 Elsevier Patient Education  2020 [employer].

## 2019-01-23 NOTE — Telephone Encounter (Signed)
Called and left voicemail for pt to return call.

## 2019-01-23 NOTE — Addendum Note (Signed)
Addended by: Ashok Norris on: 01/23/2019 01:48 PM   Modules accepted: Orders

## 2019-01-24 ENCOUNTER — Other Ambulatory Visit: Payer: Self-pay

## 2019-01-24 ENCOUNTER — Ambulatory Visit (INDEPENDENT_AMBULATORY_CARE_PROVIDER_SITE_OTHER): Payer: 59 | Admitting: Internal Medicine

## 2019-01-24 ENCOUNTER — Encounter: Payer: Self-pay | Admitting: Internal Medicine

## 2019-01-24 ENCOUNTER — Ambulatory Visit (HOSPITAL_BASED_OUTPATIENT_CLINIC_OR_DEPARTMENT_OTHER)
Admission: RE | Admit: 2019-01-24 | Discharge: 2019-01-24 | Disposition: A | Discharge status: Home / Self Care | Payer: 59 | Source: Ambulatory Visit | Attending: Cardiology | Admitting: Cardiology

## 2019-01-24 VITALS — BP 137/89 | HR 69 | Temp 98.6°F | Resp 16 | Ht 71.0 in | Wt 266.1 lb

## 2019-01-24 DIAGNOSIS — E1122 Type 2 diabetes mellitus with diabetic chronic kidney disease: Secondary | ICD-10-CM | POA: Diagnosis not present

## 2019-01-24 DIAGNOSIS — D500 Iron deficiency anemia secondary to blood loss (chronic): Secondary | ICD-10-CM | POA: Diagnosis not present

## 2019-01-24 DIAGNOSIS — R9431 Abnormal electrocardiogram [ECG] [EKG]: Secondary | ICD-10-CM

## 2019-01-24 DIAGNOSIS — I2510 Atherosclerotic heart disease of native coronary artery without angina pectoris: Secondary | ICD-10-CM

## 2019-01-24 DIAGNOSIS — N182 Chronic kidney disease, stage 2 (mild): Secondary | ICD-10-CM | POA: Diagnosis not present

## 2019-01-24 DIAGNOSIS — K625 Hemorrhage of anus and rectum: Secondary | ICD-10-CM

## 2019-01-24 DIAGNOSIS — D509 Iron deficiency anemia, unspecified: Secondary | ICD-10-CM

## 2019-01-24 HISTORY — DX: Iron deficiency anemia, unspecified: D50.9

## 2019-01-24 LAB — CBC WITH DIFFERENTIAL/PLATELET
Basophils Absolute: 0 10*3/uL (ref 0.0–0.1)
Basophils Relative: 0.7 % (ref 0.0–3.0)
Eosinophils Absolute: 0.3 10*3/uL (ref 0.0–0.7)
Eosinophils Relative: 5.9 % — ABNORMAL HIGH (ref 0.0–5.0)
HCT: 34.8 % — ABNORMAL LOW (ref 39.0–52.0)
Hemoglobin: 11.4 g/dL — ABNORMAL LOW (ref 13.0–17.0)
Lymphocytes Relative: 24 % (ref 12.0–46.0)
Lymphs Abs: 1.2 10*3/uL (ref 0.7–4.0)
MCHC: 32.8 g/dL (ref 30.0–36.0)
MCV: 84.4 fl (ref 78.0–100.0)
Monocytes Absolute: 0.3 10*3/uL (ref 0.1–1.0)
Monocytes Relative: 6.3 % (ref 3.0–12.0)
Neutro Abs: 3.1 10*3/uL (ref 1.4–7.7)
Neutrophils Relative %: 63.1 % (ref 43.0–77.0)
Platelets: 150 10*3/uL (ref 150.0–400.0)
RBC: 4.13 Mil/uL — ABNORMAL LOW (ref 4.22–5.81)
RDW: 17.4 % — ABNORMAL HIGH (ref 11.5–15.5)
WBC: 4.9 10*3/uL (ref 4.0–10.5)

## 2019-01-24 LAB — FERRITIN: Ferritin: 24.5 ng/mL (ref 22.0–322.0)

## 2019-01-24 LAB — ECHOCARDIOGRAM COMPLETE
Height: 71 in
Weight: 4258 oz

## 2019-01-24 LAB — IRON: Iron: 55 ug/dL (ref 42–165)

## 2019-01-24 MED ORDER — DULOXETINE HCL 60 MG PO CPEP: 120.0000 mg | ORAL_CAPSULE | Freq: Every day | ORAL | 5 refills | Status: DC

## 2019-01-24 MED ORDER — HYDROCORTISONE ACETATE 25 MG RE SUPP: 25.0000 mg | Freq: Two times a day (BID) | RECTAL | 1 refills | Status: DC | PRN

## 2019-01-24 MED FILL — HEMMOREX-HC 25 MG SUPP: 25 | 6 days supply | Qty: 12 | Fill #0

## 2019-01-24 MED FILL — DULOXETINE HCL 60 MG CPEP: 60 | 30 days supply | Qty: 60 | Fill #0

## 2019-01-24 MED FILL — HUMIRA PEN 40 MG/0.8ML PNKT: 40 | 28 days supply | Qty: 2 | Fill #2

## 2019-01-24 MED FILL — EMGALITY 120 MG/ML SOAJ: 120 | 90 days supply | Qty: 3 | Fill #1

## 2019-01-24 NOTE — Progress Notes (Signed)
Pre visit review using our clinic review tool, if applicable. No additional management support is needed unless otherwise documented below in the visit note. 

## 2019-01-24 NOTE — Assessment & Plan Note (Signed)
Red blood per rectum: Likely d/t  hemorrhoids.  Denies dizzines. Last colonoscopy was 07/2013, had polyps  Check a CBC, iron, ferritin. Treat with Anusol HC, ER if severe symptoms if mild but persistent symptoms refer back to GI. Anemia: Mild anemia over time, GI believes is due to GAVE and a inflammatory polyp found on recent EGD (10-2018) CRI: Creatinine baseline is increasing from the 1.4 to around 2.0.  Renal ultrasound 01/06/2019 with no obstruction. On chart review he had a CT of the abdomen with contrast on 09/19/2018 and a myelogram with contrast 11/29/2018. Has been referred to nephrology. DM: Not well controlled, chart reviewed: Recent CBG at the ER in the 600s, follow-up by Endo Abnormal EKG: Found preoperatively, seen by cardiology, note reviewed, planning an echo

## 2019-01-24 NOTE — Progress Notes (Signed)
  Echocardiogram 2D Echocardiogram has been performed.  James [name] 01/24/2019, 11:51 AM

## 2019-01-24 NOTE — Telephone Encounter (Signed)
Called patient and informed him to increase Humalog 75/25 mix to 60 units BID. Also informed him of metformin once daily at dinnertime however, pt does not have metformin listed on active med list. What dose of Metformin should he be on so I can add to his med list.

## 2019-01-24 NOTE — Patient Instructions (Signed)
GO TO THE LAB : Get the blood work    Drink plenty fluids  Use a suppository twice a day for 2 days, then as needed  If you notice severe bleeding: Call or go to the ER  If your bleeding is not severe but persisting: Please call the office in 3 to 4 days.  Also go to the ER if you feel weak, dizzy     Check the  blood pressure daily BP GOAL is between 110/65 and  135/85. If it is consistently higher or lower, let me know

## 2019-01-24 NOTE — Telephone Encounter (Signed)
Called pt again and left another voicemail requesting a call back.

## 2019-01-24 NOTE — Telephone Encounter (Addendum)
Pt called states that his BS is still running high - range 440s Pt states that he also has increased urine output.  Pt has a virtual appt 01/26/19 with Dr [name].  Pt feels something needs to done asap to get his sugars down.   Pt still taking as directed  Humalog 40 units in AM and 25 units in PM Amaryl 36m daily Rybelsus 724mdaily  Please advise.

## 2019-01-24 NOTE — Telephone Encounter (Signed)
Called pt and he stated that he last checked his blood sugar before eating lunch and it was noted to be 440. Blood sugar now is 377. Please advise.

## 2019-01-24 NOTE — Telephone Encounter (Signed)
500 mg daily which he may already have at home

## 2019-01-24 NOTE — Progress Notes (Signed)
 Subjective:    Patient ID: James [name], male    DOB: 04/03/1956, 62 y.o.   MRN: [id number]  DOS:  01/24/2019 Type of visit - description: acute His main concern today is red blood per rectum, going on for 2 or 3 days, only with bowel movements, blood is red/fresh. It is not mixed with stools. It "drips" with BMs, denies any rectal itching or pain  Additionally, chart was reviewed:  01/21/2019: Went to the ER, Due to hyperglycemia, BMP showed blood sugar of 663, creatinine 2.01.  Receive insulin.  01/23/2019: Went to cardiology, at the time of preop eval, anesthesiologist saw an abnormal EKG.  Cardiology is planning echocardiogram.  Depending on results may require cardiac cath.  renal insufficiency: Creatinine used to be in the 1.2-1.4 range,  in the last few weeks it has increased to 1.9 and 2.0   Review of Systems Denies fever chills No nausea, vomiting, diarrhea.  No abdominal pain No constipation or hard stools No heartburn or epigastric pain.  Past Medical History:  Diagnosis Date  . Acid reflux   . Arthritis   . Chronic headaches    on cymbalta  . Cirrhosis (HCC)   . Colon polyps   . Complication of anesthesia    problems waking up from anesthesia  . Depression    on cymbalta  . Diabetes mellitus with neuropathy (HCC)   . Diverticulitis 03/2013  . Eczema   . Elevated LFTs   . Fatty liver   . History of kidney stones   . Hypertension   . Insomnia 04/26/2013  . Kidney stone   . Morbid obesity (HCC)   . Neuromuscular disorder (HCC)    neuropathy  . Neuropathy   . Post-traumatic hydrocephalus (HCC)    s/p shunts x 2 (first got infected )  . Psoriasis    sees Dr Grueber  . Psoriatic arthritis (HCC)   . 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 Sleep apnea    no CPAP     . Stomach ulcer   . Testosterone deficiency 04/28/2011    Past Surgical History:  Procedure Laterality Date  . BACK SURGERY  1980  . CHOLECYSTECTOMY  N/A 08/25/2017   Procedure: LAPAROSCOPIC CHOLECYSTECTOMY WITH INTRAOPERATIVE CHOLANGIOGRAM;  Surgeon: Toth, Paul III, MD;  Location: [facility] OR;  Service: General;  Laterality: N/A;  . COLONOSCOPY    . SHOULDER SURGERY Left 2010  . TOE SURGERY Left 2018  . TONSILLECTOMY     as a child  . TOTAL HIP ARTHROPLASTY Left 2011  . VENTRICULOPERITONEAL SHUNT  2007   x2    Social History   Socioeconomic History  . Marital status: Married    Spouse name: Wanda  . Number of children: 2  . Years of education: Not on file  . Highest education level: Not on file  Occupational History  . Occupation: disable   Social Needs  . Financial resource strain: Not on file  . Food insecurity    Worry: Not on file    Inability: Not on file  . Transportation needs    Medical: Not on file    Non-medical: Not on file  Tobacco Use  . Smoking status: Never Smoker  . Smokeless tobacco: Never Used  Substance and Sexual Activity  . Alcohol use: No  . Drug use: No  . Sexual activity: Yes    Partners: Female  Lifestyle  . Physical activity    Days per week:   Not on file    Minutes per session: Not on file  . Stress: Not on file  Relationships  . Social Herbalist on phone: Not on file    Gets together: Not on file    Attends religious service: Not on file    Active member of club or organization: Not on file    Attends meetings of clubs or organizations: Not on file    Relationship status: Not on file  . Intimate partner violence    Fear of current or ex partner: Not on file    Emotionally abused: Not on file    Physically abused: Not on file    Forced sexual activity: Not on file  Other Topics Concern  . 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      Allergies as of 01/24/2019      Reactions   Bee Venom Swelling    Hydrocodone-homatropine Other (See Comments)   Depressed feeling   Morphine And Related Other (See Comments)   Hallucinations, back in the 80s. States has taken vicodin before w/o problems    Sulfa Drugs Cross Reactors Rash      Medication List       Accurate as of January 24, 2019  9:06 AM. If you have any questions, ask your nurse or doctor.        Adalimumab 40 MG/0.8ML Pnkt Commonly known as: Humira Pen Inject 0.8 mLs into the skin every 14 (fourteen) days. Inject 40 mg (0.8 ml) under the skin every other week.   atorvastatin 10 MG tablet Commonly known as: LIPITOR TAKE 1 TABLET (10 MG TOTAL) BY MOUTH DAILY.   carvedilol 12.5 MG tablet Commonly known as: COREG Take 1 tablet (12.5 mg total) by mouth 2 (two) times daily with a meal.   DULoxetine 60 MG capsule Commonly known as: CYMBALTA Take 2 capsules (120 mg total) by mouth daily.   EPINEPHrine 0.3 mg/0.3 mL Soaj injection Commonly known as: EpiPen 2-Pak Inject 0.3 mLs (0.3 mg total) into the muscle once as needed for up to 1 dose.   fenofibrate micronized 134 MG capsule Commonly known as: LOFIBRA Take 1 tablet by mouth three times weekly. What changed:   how much to take  how to take this  when to take this  additional instructions   FreeStyle Freedom Lite w/Device Kit Use to check blood sugars 3 times daily. Dx code: E11.9   freestyle lancets Use to check blood sugar 3 times daily. Dx code E11.9   gabapentin 600 MG tablet Commonly known as: NEURONTIN Take 1 tablet (600 mg total) by mouth 2 (two) times daily.   Galcanezumab-gnlm 120 MG/ML Soaj Commonly known as: Emgality Inject 1 pen into the skin every 28 (twenty-eight) days.   glimepiride 1 MG tablet Commonly known as: AMARYL TAKE 1 TABLET (1 MG TOTAL) BY MOUTH DAILY BEFORE SUPPER.   glucose blood test strip Commonly known as: Accu-Chek Guide Use to test blood sugar three times daily   Insulin Lispro Prot & Lispro (75-25) 100 UNIT/ML Kwikpen  Commonly known as: HumaLOG Mix 75/25 KwikPen 10 units before meals twice daily or as directed What changed: additional instructions   Insulin Pen Needle 31G X 5 MM Misc Use for insulin pen twice a day   Iron 325 (65  Fe) MG Tabs Take 1 tablet (325 mg total) by mouth 2 (two) times daily. Take on a empty stomach   levETIRAcetam 750 MG tablet Commonly known as: KEPPRA TAKE 1 TABLET (750 MG TOTAL) BY MOUTH 2 (TWO) TIMES DAILY.   omeprazole 40 MG capsule Commonly known as: PRILOSEC Take 1 capsule (40 mg total) by mouth 2 (two) times daily before a meal.   Rybelsus 7 MG Tabs Generic drug: Semaglutide Take 1 capsule by mouth daily before breakfast.   triamcinolone cream 0.1 % Commonly known as: KENALOG Apply 1 application topically daily as needed (rash).           Objective:   Physical Exam BP 137/89 (BP Location: Left Arm, Patient Position: Sitting, Cuff Size: Small)   Pulse 69   Temp 98.6 F (37 C) (Oral)   Resp 16   Ht 5' 11" (1.803 m)   Wt 266 lb 2 oz (120.7 kg)   SpO2 99%   BMI 37.12 kg/m  General:   Well developed, NAD, BMI noted.  HEENT:  Normocephalic . Face symmetric, atraumatic  Abdomen:  Not distended, soft, non-tender. No rebound or rigidity.   DRE: External hemorrhoids noted, no mass felt, no stool, blood or mucus found. Anoscopy: He has multiple internal hemorrhoids, he has a large one that looks slightly irritated at the 6 o'clock position (anteriorly) Skin: Not pale. Not jaundice Neurologic:  alert & oriented X3.  Speech normal, gait appropriate for age and unassisted Psych--  Cognition and judgment appear intact.  Cooperative with normal attention span and concentration.  Behavior appropriate. No anxious or depressed appearing.     Assessment     Assessment  DM - Dr Kumar Neuropathy (x years, rx gaba 05-2014, w/u 11-2014  RPR neg, vit D-B12-Folic Acid wnl ); Saw Dr Patel, NCS [id number] 1. The electrophysiologic findings are most consistent  with a chronic, distal and symmetric sensorimotor polyneuropathy, predominantly axon loss in type, affecting the lower extremities. 2. There is no evidence of a superimposed lumbosacral radiculopathy. CRI HTN Hyperlipidemia (TG in the 500s 2016) OSA , dx 2012, sleep study again 02-2015 Dr Dohmeier--> severe OSA, intolerant to CPAP Depression, insomnia ----on Cymbalta NEURO: --Chronic headaches -----on Cymbalta --Posttraumatic hydrocephalus s/p 2 shunts (first got infected) MSK: on disability d/t back pain- HAs Hypogonadism  Dx 2012, normal T 11-2014 (on no RX) GI:  --GERD, diverticulitis 2014, h/o PUD -- NASH with cirrhosis per GI note 10/2017, s/p Hep A/B shots --Anemia: Mild, on and off, felt to be d/t   GAVE (gastric antral vascular ectasia) and a inflammatory polyp (per EGD 10/2018)  Psoriasis, psoriatic arthritis -- on HUMIRA  H/o urolithiasis +FH CAD brother MI age 49  PLAN Red blood per rectum: Likely d/t  hemorrhoids.  Denies dizzines. Last colonoscopy was 07/2013, had polyps  Check a CBC, iron, ferritin. Treat with Anusol HC, ER if severe symptoms if mild but persistent symptoms refer back to GI. Anemia: Mild anemia over time, GI believes is due to GAVE and a inflammatory polyp found on recent EGD (10-2018) CRI: Creatinine baseline is increasing from the 1.4 to around 2.0.  Renal ultrasound 01/06/2019 with no obstruction. On chart review he had a CT of the abdomen with contrast on 09/19/2018 and a myelogram with contrast 11/29/2018. Has been referred to nephrology. DM: Not well controlled, chart reviewed: Recent CBG at the ER in the 600s, follow-up by Endo Abnormal EKG: Found preoperatively, seen by cardiology, note reviewed, planning an echo    

## 2019-01-24 NOTE — Telephone Encounter (Signed)
He will need to increase his insulin to 60 units twice a day.  Also start taking 1 tablet of metformin at dinnertime daily

## 2019-01-25 ENCOUNTER — Other Ambulatory Visit: Payer: Self-pay

## 2019-01-25 NOTE — Telephone Encounter (Signed)
Pt does have some Metformin at home, but I was not aware of dose. This has been added to his chart to make is accurate.

## 2019-01-26 ENCOUNTER — Encounter: Payer: Self-pay | Admitting: Endocrinology

## 2019-01-26 ENCOUNTER — Ambulatory Visit (INDEPENDENT_AMBULATORY_CARE_PROVIDER_SITE_OTHER): Payer: 59 | Admitting: Endocrinology

## 2019-01-26 ENCOUNTER — Other Ambulatory Visit: Payer: Self-pay

## 2019-01-26 DIAGNOSIS — I2510 Atherosclerotic heart disease of native coronary artery without angina pectoris: Secondary | ICD-10-CM

## 2019-01-26 DIAGNOSIS — N183 Chronic kidney disease, stage 3 (moderate): Secondary | ICD-10-CM | POA: Diagnosis not present

## 2019-01-26 DIAGNOSIS — E1165 Type 2 diabetes mellitus with hyperglycemia: Secondary | ICD-10-CM | POA: Diagnosis not present

## 2019-01-26 DIAGNOSIS — K219 Gastro-esophageal reflux disease without esophagitis: Secondary | ICD-10-CM | POA: Diagnosis not present

## 2019-01-26 DIAGNOSIS — E119 Type 2 diabetes mellitus without complications: Secondary | ICD-10-CM | POA: Diagnosis not present

## 2019-01-26 DIAGNOSIS — K7581 Nonalcoholic steatohepatitis (NASH): Secondary | ICD-10-CM | POA: Diagnosis not present

## 2019-01-26 DIAGNOSIS — I10 Essential (primary) hypertension: Secondary | ICD-10-CM | POA: Diagnosis not present

## 2019-01-26 LAB — BASIC METABOLIC PANEL
BUN: 21 (ref 4–21)
Creatinine: 1.8 — AB (ref 0.6–1.3)
Glucose: 436
Potassium: 5.2 (ref 3.4–5.3)
Sodium: 135 — AB (ref 137–147)

## 2019-01-26 MED ORDER — RYBELSUS 14 MG PO TABS: 1.0000 | ORAL_TABLET | Freq: Every day | ORAL | 2 refills | Status: DC

## 2019-01-26 NOTE — Progress Notes (Signed)
Patient ID: James [name], male   DOB: August 12, 1955, 63 y.o.   MRN: [id number]           Reason for Appointment: Follow-up for Type 2 Diabetes  Today's office visit was provided via telemedicine using a telephone call to the patient Patient has been explained the limitations of evaluation and management by telemedicine and the availability of in person appointments.  The patient understood the limitations and agreed to proceed. Patient also understood that the telehealth visit is billable. . Location of the patient: Home . Location of the provider: Office Only the patient and myself were participating in the encounter   History of Present Illness:          Date of diagnosis of type 2 diabetes mellitus:  ?  2014      Background history:  He is not clear how his diabetes was diagnosed, likely on routine testing. Initially had been treated with metformin and also tried on Amaryl Patient had progressive increase in his blood sugars since 1/17 with stopping metformin and being on the regimen of Amaryl and Januvia, he thinks his blood sugars went up to 601.  He was then started on basal bolus insulin   Recent history:   INSULIN regimen: Humalog mix 75/25, 60 units twice daily  Non-insulin hypoglycemic drugs the patient is taking are: Rybelsus 7 mg daily, Amaryl 59m, metformin 500 mg daily  His A1c is recently 11% which is higher than he has had in the last 4 years Prior A1c was 6.8  Current management, blood sugar patterns and problems identified:  He was started on Humalog mix insulin along with Rybelsus because of his significantly higher sugars  However despite increasing his insulin progressively and also going up to 7 mg on Rybelsus his blood sugars are still poorly controlled  His glucose was 663 in the emergency room about 5 days ago when he presented with feeling dizzy and having excessive thirst  He has been told to cut back on sweet tea and improved with diet but he still  drinking juices and lemonade although probably no more than 2 glasses a day  Because of his back pain he cannot do any exercise  His insulin was increased significantly on 7/27 but his blood sugars do not appear to be significantly better  LOWEST blood sugar has been 200 and 3 in the morning  Blood sugars are higher before and after dinnertime  This is despite is trying to be compliant with taking his insulin twice a day before meals  No nausea with 7 mg Rybelsus which he has taken for at least a week  He also has started with metformin 500 mg once a day since his creatinine was below 2.0  Side  effects from medications have been: None  Compliance with the medical regimen: Fair  Glucose monitoring:  done up to 3 times a day         Glucometer:  Accu-Chek Blood Glucose readings   PRE-MEAL Fasting Lunch Dinner Bedtime Overall  Glucose range:  203-330  241-500+  366-463  372-556   Mean/median:     ?   Previous readings:  PRE-MEAL Fasting Lunch Dinner Bedtime Overall  Glucose range:  290-338  366-463  300-478  346-419   Mean/median:         Self-care:   Meal times are:  Breakfast may be skipped.  Lunch: 2-3 PM Dinner: 5-6 PM    Dietician visit, most recent:09/2015  CDE consultation: 12/2018  Weight history:  Wt Readings from Last 3 Encounters:  01/24/19 266 lb 2 oz (120.7 kg)  01/23/19 270 lb 6.4 oz (122.7 kg)  01/21/19 266 lb 12.1 oz (121 kg)    Glycemic control:   Lab Results  Component Value Date   HGBA1C 11.0 (A) 12/28/2018   HGBA1C 6.8 (H) 05/03/2018   HGBA1C 6.6 (H) 01/28/2018   Lab Results  Component Value Date   MICROALBUR 3.4 (H) 12/28/2018   LDLCALC 137 (H) 09/07/2016   CREATININE 2.01 (H) 01/21/2019    Office Visit on 01/24/2019  Component Date Value Ref Range Status  . WBC 01/24/2019 4.9  4.0 - 10.5 K/uL Final  . RBC 01/24/2019 4.13* 4.22 - 5.81 Mil/uL Final  . Hemoglobin 01/24/2019 11.4* 13.0 - 17.0 g/dL Final  . HCT  01/24/2019 34.8* 39.0 - 52.0 % Final  . MCV 01/24/2019 84.4  78.0 - 100.0 fl Final  . MCHC 01/24/2019 32.8  30.0 - 36.0 g/dL Final  . RDW 01/24/2019 17.4* 11.5 - 15.5 % Final  . Platelets 01/24/2019 150.0  150.0 - 400.0 K/uL Final  . Neutrophils Relative % 01/24/2019 63.1  43.0 - 77.0 % Final  . Lymphocytes Relative 01/24/2019 24.0  12.0 - 46.0 % Final  . Monocytes Relative 01/24/2019 6.3  3.0 - 12.0 % Final  . Eosinophils Relative 01/24/2019 5.9* 0.0 - 5.0 % Final  . Basophils Relative 01/24/2019 0.7  0.0 - 3.0 % Final  . Neutro Abs 01/24/2019 3.1  1.4 - 7.7 K/uL Final  . Lymphs Abs 01/24/2019 1.2  0.7 - 4.0 K/uL Final  . Monocytes Absolute 01/24/2019 0.3  0.1 - 1.0 K/uL Final  . Eosinophils Absolute 01/24/2019 0.3  0.0 - 0.7 K/uL Final  . Basophils Absolute 01/24/2019 0.0  0.0 - 0.1 K/uL Final  . Iron 01/24/2019 55  42 - 165 ug/dL Final  . Ferritin 01/24/2019 24.5  22.0 - 322.0 ng/mL Final  Office Visit on 01/23/2019  Component Date Value Ref Range Status  . Weight 01/24/2019 4,258  oz Final  . Height 01/24/2019 71  in Final  . BP 01/24/2019 137/89  mmHg Final  Admission on 01/21/2019, Discharged on 01/21/2019  Component Date Value Ref Range Status  . Glucose-Capillary 01/21/2019 572* 70 - 99 mg/dL Final  . Comment 1 01/21/2019 Notify RN   Final  . Comment 2 01/21/2019 Call MD NNP PA CNM   Final  . Sodium 01/21/2019 130* 135 - 145 mmol/L Final  . Potassium 01/21/2019 4.6  3.5 - 5.1 mmol/L Final  . Chloride 01/21/2019 97* 98 - 111 mmol/L Final  . CO2 01/21/2019 23  22 - 32 mmol/L Final  . Glucose, Bld 01/21/2019 663* 70 - 99 mg/dL Final   Comment: CRITICAL RESULT CALLED TO, READ BACK BY AND VERIFIED WITH: REED CINDY RN AT 1729 ON 01/21/19 BY [email or web address]   . BUN 01/21/2019 23* 6 - 20 mg/dL Final  . Creatinine, Ser 01/21/2019 2.01* 0.61 - 1.24 mg/dL Final  . Calcium 01/21/2019 8.8* 8.9 - 10.3 mg/dL Final  . GFR calc non Af Amer 01/21/2019 35* >60 mL/min Final  . GFR calc Af Amer  01/21/2019 41* >60 mL/min Final  . Anion gap 01/21/2019 10  5 - 15 Final   Performed at [facility], [address] [place], [place] [address]  . WBC 01/21/2019 4.5  4.0 - 10.5 K/uL Final  . RBC 01/21/2019 4.27  4.22 - 5.81 MIL/uL Final  .  Hemoglobin 01/21/2019 11.6* 13.0 - 17.0 g/dL Final  . HCT 01/21/2019 37.0* 39.0 - 52.0 % Final  . MCV 01/21/2019 86.7  80.0 - 100.0 fL Final  . MCH 01/21/2019 27.2  26.0 - 34.0 pg Final  . MCHC 01/21/2019 31.4  30.0 - 36.0 g/dL Final  . RDW 01/21/2019 16.1* 11.5 - 15.5 % Final  . Platelets 01/21/2019 152  150 - 400 K/uL Final  . nRBC 01/21/2019 0.0  0.0 - 0.2 % Final   Performed at [facility], [address] [place], [place] [address]  . Color, Urine 01/21/2019 YELLOW  YELLOW Final  . APPearance 01/21/2019 CLEAR  CLEAR Final  . Specific Gravity, Urine 01/21/2019 1.010  1.005 - 1.030 Final  . pH 01/21/2019 6.0  5.0 - 8.0 Final  . Glucose, UA 01/21/2019 >=500* NEGATIVE mg/dL Final  . Hgb urine dipstick 01/21/2019 NEGATIVE  NEGATIVE Final  . Bilirubin Urine 01/21/2019 NEGATIVE  NEGATIVE Final  . Ketones, ur 01/21/2019 NEGATIVE  NEGATIVE mg/dL Final  . Protein, ur 01/21/2019 NEGATIVE  NEGATIVE mg/dL Final  . Nitrite 01/21/2019 NEGATIVE  NEGATIVE Final  . Chalmers Guest 01/21/2019 NEGATIVE  NEGATIVE Final   Performed at [facility], [address] [place], [place] [address]  . Glucose-Capillary 01/21/2019 496* 70 - 99 mg/dL Final  . pH, Ven 01/21/2019 7.318  7.250 - 7.430 Final  . pCO2, Ven 01/21/2019 50.1  44.0 - 60.0 mmHg Final  . pO2, Ven 01/21/2019 30.0* 32.0 - 45.0 mmHg Final  . Bicarbonate 01/21/2019 25.7  20.0 - 28.0 mmol/L Final  . TCO2 01/21/2019 27  22 - 32 mmol/L Final  . O2 Saturation 01/21/2019 52.0  % Final  . Acid-base deficit 01/21/2019 1.0  0.0 - 2.0 mmol/L Final  . Sodium 01/21/2019 133* 135 - 145 mmol/L Final  . Potassium 01/21/2019 4.8  3.5 - 5.1 mmol/L Final  . Calcium, Ion 01/21/2019 1.24   1.15 - 1.40 mmol/L Final  . HCT 01/21/2019 37.0* 39.0 - 52.0 % Final  . Hemoglobin 01/21/2019 12.6* 13.0 - 17.0 g/dL Final  . Patient temperature 01/21/2019 98.4 F   Final  . Collection site 01/21/2019 IV START   Final  . Drawn by 01/21/2019 Nurse   Final  . Sample type 01/21/2019 VENOUS   Final  . Comment 01/21/2019 VALUES EXPECTED, NO REPEAT   Final  . RBC / HPF 01/21/2019 NONE SEEN  0 - 5 RBC/hpf Final  . WBC, UA 01/21/2019 0-5  0 - 5 WBC/hpf Final  . Bacteria, UA 01/21/2019 RARE* NONE SEEN Final  . Squamous Epithelial / LPF 01/21/2019 0-5  0 - 5 Final   Performed at [facility], [address] [place], [place] [address]  . Glucose-Capillary 01/21/2019 412* 70 - 99 mg/dL Final       Allergies as of 01/26/2019      Reactions   Bee Venom Swelling   Hydrocodone-homatropine Other (See Comments)   Depressed feeling   Morphine And Related Other (See Comments)   Hallucinations, back in the 80s. States has taken vicodin before w/o problems    Sulfa Drugs Cross Reactors Rash      Medication List       Accurate as of January 26, 2019  4:08 PM. If you have any questions, ask your nurse or doctor.        Adalimumab 40 MG/0.8ML Pnkt Commonly known as: Humira Pen Inject 0.8 mLs into the skin every 14 (fourteen)  days. Inject 40 mg (0.8 ml) under the skin every other week.   atorvastatin 10 MG tablet Commonly known as: LIPITOR TAKE 1 TABLET (10 MG TOTAL) BY MOUTH DAILY.   carvedilol 12.5 MG tablet Commonly known as: COREG Take 1 tablet (12.5 mg total) by mouth 2 (two) times daily with a meal.   DULoxetine 60 MG capsule Commonly known as: CYMBALTA Take 2 capsules (120 mg total) by mouth daily.   EPINEPHrine 0.3 mg/0.3 mL Soaj injection Commonly known as: EpiPen 2-Pak Inject 0.3 mLs (0.3 mg total) into the muscle once as needed for up to 1 dose.   fenofibrate micronized 134 MG capsule Commonly known as: LOFIBRA Take 1 tablet by mouth three times weekly. What  changed:   how much to take  how to take this  when to take this  additional instructions   FreeStyle Freedom Lite w/Device Kit Use to check blood sugars 3 times daily. Dx code: E11.9   freestyle lancets Use to check blood sugar 3 times daily. Dx code E11.9   gabapentin 600 MG tablet Commonly known as: NEURONTIN Take 1 tablet (600 mg total) by mouth 2 (two) times daily.   Galcanezumab-gnlm 120 MG/ML Soaj Commonly known as: Emgality Inject 1 pen into the skin every 28 (twenty-eight) days.   glimepiride 1 MG tablet Commonly known as: AMARYL TAKE 1 TABLET (1 MG TOTAL) BY MOUTH DAILY BEFORE SUPPER.   glucose blood test strip Commonly known as: Accu-Chek Guide Use to test blood sugar three times daily   hydrocortisone 25 MG suppository Commonly known as: ANUSOL-HC Place 1 suppository (25 mg total) rectally 2 (two) times daily as needed for hemorrhoids.   Insulin Lispro Prot & Lispro (75-25) 100 UNIT/ML Kwikpen Commonly known as: HumaLOG Mix 75/25 KwikPen 10 units before meals twice daily or as directed What changed: additional instructions   Insulin Pen Needle 31G X 5 MM Misc Use for insulin pen twice a day   Iron 325 (65 Fe) MG Tabs Take 1 tablet (325 mg total) by mouth 2 (two) times daily. Take on a empty stomach   levETIRAcetam 750 MG tablet Commonly known as: KEPPRA TAKE 1 TABLET (750 MG TOTAL) BY MOUTH 2 (TWO) TIMES DAILY.   metFORMIN 500 MG tablet Commonly known as: GLUCOPHAGE Take 500 mg by mouth daily. Take 1 tablet by mouth daily at dinnertime.   omeprazole 40 MG capsule Commonly known as: PRILOSEC Take 1 capsule (40 mg total) by mouth 2 (two) times daily before a meal.   Rybelsus 7 MG Tabs Generic drug: Semaglutide Take 1 capsule by mouth daily before breakfast.   triamcinolone cream 0.1 % Commonly known as: KENALOG Apply 1 application topically daily as needed (rash).       Allergies:  Allergies  Allergen Reactions  . Bee Venom Swelling   . Hydrocodone-Homatropine Other (See Comments)    Depressed feeling  . Morphine And Related Other (See Comments)    Hallucinations, back in the 80s. States has taken vicodin before w/o problems   . Sulfa Drugs Cross Reactors Rash    Past Medical History:  Diagnosis Date  . Acid reflux   . Arthritis   . Chronic headaches    on cymbalta  . Cirrhosis ([place])   . Colon polyps   . Complication of anesthesia    problems waking up from anesthesia  . Depression    on cymbalta  . Diabetes mellitus with neuropathy ([place])   . Diverticulitis 03/2013  . Eczema   .  Elevated LFTs   . Fatty liver   . History of kidney stones   . Hypertension   . Insomnia 04/26/2013  . Kidney stone   . Morbid obesity ([place])   . Neuromuscular disorder (H[facility])    neuropathy  . Neuropathy   . Post-traumatic hydrocephalus (H[facility])    s/p shunts x 2 (first got infected )  . Psoriasis    sees Dr [name]  . Psoriatic arthritis ([place])   . 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 Sleep apnea    no CPAP     . Stomach ulcer   . Testosterone deficiency 04/28/2011    Past Surgical History:  Procedure Laterality Date  . BACK SURGERY  1980  . CHOLECYSTECTOMY N/A 08/25/2017   Procedure: LAPAROSCOPIC CHOLECYSTECTOMY WITH INTRAOPERATIVE CHOLANGIOGRAM;  Surgeon: [name], MD;  Location: [place];  Service: General;  Laterality: N/A;  . COLONOSCOPY    . SHOULDER SURGERY Left 2010  . TOE SURGERY Left 2018  . TONSILLECTOMY     as a child  . TOTAL HIP ARTHROPLASTY Left 2011  . VENTRICULOPERITONEAL SHUNT  2007   x2    Family History  Problem Relation Age of Onset  . Other Mother   . Lung cancer Father        alive, former smoker   . Heart disease Brother        MI age 50  . Other Brother        Murdered  . Down syndrome Son   . Diabetes Neg Hx   . Prostate cancer Neg Hx   . Colon cancer Neg Hx   . Stomach cancer Neg Hx   . Pancreatic cancer Neg Hx   . Liver disease Neg Hx      Social History:  reports that he has never smoked. He has never used smokeless tobacco. He reports that he does not drink alcohol or use drugs.    Review of Systems     HYPERTENSION: Blood pressure  is controlled on carvedilol Followed by PCP   BP Readings from Last 3 Encounters:  01/24/19 137/89  01/23/19 (!) 152/82  01/21/19 (!) 149/103    Lipid history: He is on Lipitor 10 mg with control of LDL previously Is on fenofibrate for triglyceride treatment, has increased triglycerides usually  No recent labs available   Lab Results  Component Value Date   CHOL 170 05/03/2018   HDL 43.00 05/03/2018   LDLCALC 137 (H) 09/07/2016   LDLDIRECT 89.0 05/03/2018   TRIG 332.0 (H) 05/03/2018   CHOLHDL 4 05/03/2018            Most recent foot exam: 08/2017  He has symptomatic painful neuropathy and is taking gabapentin 600 mg twice a day and  Cymbalta Also has difficulty with balance Has been followed by neurologist    Review of Systems    Physical Examination:  There were no vitals taken for this visit.      ASSESSMENT:  Diabetes type 2 with obesity  See history of present illness for discussion of current diabetes management, blood sugar patterns and problems identified  Currently on a regimen of premixed insulin, Rybelsus and low-dose Amaryl  A1c is last 11%  He has marked insulin deficiency and also insulin resistance and appears to have had marked increase in blood sugars after stopping metformin because of renal dysfunction  Even with 120 units of insulin a day his blood sugars are mostly  over 300 with only a couple of readings in the 200 range at breakfast and lunchtime  His diet appears to be is presently good although he does need to eliminate all sweet drinks completely .  He has had increased thirst which is slightly better Also symptomatic with fatigue   PLAN:   Trial of Rybelsus 14 mg from tomorrow morning He will start taking insulin 3 times a  day He will take 60 units before breakfast, 40 before lunch and 50 units before dinnertime for now He will call if blood sugars are not better in the next week or so but will follow-up in 3 weeks. Stop drinking lemonade and fruit juices and use artificially sweetened drinks Increase water intake Continue 500 mg metformin for now and await input from nephrologist also  There are no Patient Instructions on file for this visit.    Elayne Snare 01/26/2019, 4:08 PM

## 2019-01-27 ENCOUNTER — Telehealth: Payer: Self-pay | Admitting: *Deleted

## 2019-01-27 NOTE — Telephone Encounter (Signed)
Pt would like to know the results of echo.

## 2019-01-30 ENCOUNTER — Telehealth: Payer: Self-pay | Admitting: Cardiology

## 2019-01-30 NOTE — Telephone Encounter (Signed)
Normal LVDEF  Mild LVH

## 2019-01-30 NOTE — Telephone Encounter (Signed)
Left message for patient to return call.

## 2019-01-30 NOTE — Telephone Encounter (Signed)
Patient returned your call, please call back. °

## 2019-01-30 NOTE — Telephone Encounter (Signed)
Patient calling in reporting chest pain that happened x2 last night. He reports it was on the left side of his chest with no radiation of the pain. He denies shortness of breath. He states that the pain lasted 1 min and returned again for a second time. He denies pain right now. Patient advised to go to the emergency department if the pain returns. Will route to DOD for further instruction.

## 2019-01-30 NOTE — Telephone Encounter (Signed)
Ask him to let us know if that happemn egain, for now continue prsent management. His echo did not show MI, looks good

## 2019-01-30 NOTE — Telephone Encounter (Signed)
Patient called back and was informed of results. No further questions.

## 2019-01-30 NOTE — Telephone Encounter (Signed)
Called patient back informed him of Dr. Wendy Poet recommendation. He verbally understood. No further questions.

## 2019-01-30 NOTE — Telephone Encounter (Signed)
Had 2 episodes of chest pains last night that woke him from his sleep.  Please call patient to discuss [phone]

## 2019-02-03 ENCOUNTER — Encounter: Payer: Self-pay | Admitting: Endocrinology

## 2019-02-03 MED ORDER — INSULIN LISPRO PROT & LISPRO (75-25 MIX) 100 UNIT/ML KWIKPEN: PEN_INJECTOR | SUBCUTANEOUS | 0 refills | Status: DC

## 2019-02-03 MED FILL — OMRON 3 SERIES BP MONITOR D: 1 days supply | Qty: 1 | Fill #0

## 2019-02-03 MED FILL — levETIRAcetam 750 MG TABS: 750 | 30 days supply | Qty: 60 | Fill #4

## 2019-02-03 MED FILL — GABAPENTIN 600 MG TABS: 600 | 30 days supply | Qty: 60 | Fill #1

## 2019-02-03 MED FILL — ATORVASTATIN 10 MG TABLET: 10 | 90 days supply | Qty: 90 | Fill #3

## 2019-02-03 NOTE — Telephone Encounter (Signed)
Refilling Humalog as pt's dose has increased recently and he is nearly out. Pt tried to call Endocrine office but got no answer so I did this one time only

## 2019-02-04 MED FILL — HUMALOG MIX 75-25 KWIKPEN: (75-25) 100 | 14 days supply | Qty: 15 | Fill #0

## 2019-02-06 ENCOUNTER — Telehealth: Payer: Self-pay | Admitting: Endocrinology

## 2019-02-06 NOTE — Telephone Encounter (Signed)
MEDICATION: Insulin Lispro Prot & Lispro (HUMALOG MIX 75/25 KWIKPEN) (75-25) 100 UNIT/ML Kwikpen  PHARMACY:  [place], [place] - [place] A 90 DAY SUPPLY :   IS PATIENT OUT OF MEDICATION: YES  IF NOT; HOW MUCH IS LEFT:   LAST APPOINTMENT DATE: @7 /30/2020  NEXT APPOINTMENT DATE:@Visit  date not found  DO WE HAVE YOUR PERMISSION TO LEAVE A DETAILED MESSAGE:  OTHER COMMENTS:  Patients wife is stating the pharmacy is requesting a prior auth for the increase   Please call wife back Michaeal Davis / [phone])  Also, patients surgeon is waiting on Dr.Kumar for clearance on his back surgery.  **Let patient know to contact pharmacy at the end of the day to make sure medication is ready. **  ** Please notify patient to allow 48-72 hours to process**  **Encourage patient to contact the pharmacy for refills or they can request refills through [facility]**

## 2019-02-07 NOTE — Telephone Encounter (Signed)
Artemus [place]: [id number] help? Call us at [phone] Outcome Additional Information Required Prior Authorization is not required for this medication dosage form and strength at the quantity and days supply requested. Drug HumaLOG Mix 75/25 KwikPen (75-25) 100UNIT/ML pen-injectors Form [employer] ePA Form  Prior Authorization is not required for this medication dosage form and strength at the quantity and days supply requested.

## 2019-02-09 ENCOUNTER — Encounter: Payer: Self-pay | Admitting: Endocrinology

## 2019-02-09 ENCOUNTER — Other Ambulatory Visit: Payer: Self-pay

## 2019-02-09 ENCOUNTER — Ambulatory Visit (INDEPENDENT_AMBULATORY_CARE_PROVIDER_SITE_OTHER): Payer: 59 | Admitting: Endocrinology

## 2019-02-09 ENCOUNTER — Encounter: Payer: Self-pay | Admitting: Internal Medicine

## 2019-02-09 DIAGNOSIS — E1165 Type 2 diabetes mellitus with hyperglycemia: Secondary | ICD-10-CM

## 2019-02-09 DIAGNOSIS — I2510 Atherosclerotic heart disease of native coronary artery without angina pectoris: Secondary | ICD-10-CM

## 2019-02-09 MED ORDER — INSULIN LISPRO PROT & LISPRO (75-25 MIX) 100 UNIT/ML KWIKPEN: PEN_INJECTOR | SUBCUTANEOUS | 2 refills | Status: DC

## 2019-02-09 MED ORDER — RYBELSUS 14 MG PO TABS: 1.0000 | ORAL_TABLET | Freq: Every day | ORAL | 2 refills | Status: DC

## 2019-02-09 MED FILL — RYBELSUS 14 MG TABS: 14 | 30 days supply | Qty: 30 | Fill #0

## 2019-02-09 NOTE — Progress Notes (Signed)
Patient ID: James [name], male   DOB: [birth year], 63 y.o.   MRN: [id number]           Reason for Appointment: Follow-up for Type 2 Diabetes  Today's office visit was provided via telemedicine using a telephone call to the patient Patient has been explained the limitations of evaluation and management by telemedicine and the availability of in person appointments.  The patient understood the limitations and agreed to proceed. Patient also understood that the telehealth visit is billable.  Location of the patient: Home  Location of the provider: Office Only the patient and myself were participating in the encounter   History of Present Illness:          Date of diagnosis of type 2 diabetes mellitus:  ?  2014      Background history:  He is not clear how his diabetes was diagnosed, likely on routine testing. Initially had been treated with metformin and also tried on Amaryl Patient had progressive increase in his blood sugars since 1/17 with stopping metformin and being on the regimen of Amaryl and Januvia, he thinks his blood sugars went up to 601.  He was then started on basal bolus insulin   Recent history:   INSULIN regimen: Humalog mix 75/25, 60 units in the morning, 40 at lunch and 50 before dinner   Non-insulin hypoglycemic drugs the patient is taking are: Rybelsus 7 mg daily, Amaryl 12m, metformin 500 mg daily  His A1c is last 11% which is higher than he has had in the last 4 years Prior A1c was 6.8  Current management, blood sugar patterns and problems identified:  He was started on additional lunchtime Humalog mix insulin on his last visit about 2 weeks ago  He is here for short-term follow-up since he wants to get clearance for his spine surgery  His blood sugars on the last visit were as high as 556  However with increasing his insulin dose overall and adding another dose at lunchtime his blood sugars have been mostly improved although blood sugars were somewhat  higher in the evenings on 8/10 and 8/11  FASTING readings are also higher the last 3 mornings  He has been trying to be regular with his medications and insulin  Although he has been asked to completely eliminate all drinks with sugar he still likes to drink a glass of sweet tea, he claims that there is not much sugar in this  He also thinks that he is not snacking at night except some fruit after supper  Yesterday with doing some yard work and being more active his blood sugar went down to 73 since he did not eat his lunch on time  No nausea with 7 mg Rybelsus which was increased last month  He also has stayed on his metformin 500 mg once a day since his creatinine was below 2.0  Side  effects from medications have been: None  Compliance with the medical regimen: Fair  Glucose monitoring:  done up to 3 times a day         Glucometer:  Accu-Chek Blood Glucose readings   PRE-MEAL Fasting Lunch Dinner Bedtime Overall  Glucose range:  130-278  73-214  88-222  199-212   Mean/median:     ?   Readings from last visit in 12/2018:  PRE-MEAL Fasting Lunch Dinner Bedtime Overall  Glucose range:  203-330  241-500+  366-463  372-556   Mean/median:     ?  Self-care:   Meal times are:  Breakfast variable, lunch: 2-3 PM Dinner: 5-6 PM    Dietician visit, most recent:09/2015               CDE consultation: 12/2018  Weight history:  Wt Readings from Last 3 Encounters:  01/24/19 266 lb 2 oz (120.7 kg)  01/23/19 270 lb 6.4 oz (122.7 kg)  01/21/19 266 lb 12.1 oz (121 kg)    Glycemic control:   Lab Results  Component Value Date   HGBA1C 11.0 (A) 12/28/2018   HGBA1C 6.8 (H) 05/03/2018   HGBA1C 6.6 (H) 01/28/2018   Lab Results  Component Value Date   MICROALBUR 3.4 (H) 12/28/2018   LDLCALC 137 (H) 09/07/2016   CREATININE 1.8 (A) 01/26/2019    Abstract on 02/09/2019  Component Date Value Ref Range Status   Glucose 01/26/2019 436   Final   BUN 01/26/2019 21  4 - 21  Final   Creatinine 01/26/2019 1.8* 0.6 - 1.3 Final   Potassium 01/26/2019 5.2  3.4 - 5.3 Final   Sodium 01/26/2019 135* 137 - 147 Final       Allergies as of 02/09/2019      Reactions   Bee Venom Swelling   Hydrocodone-homatropine Other (See Comments)   Depressed feeling   Morphine And Related Other (See Comments)   Hallucinations, back in the 80s. States has taken vicodin before w/o problems    Sulfa Drugs Cross Reactors Rash      Medication List       Accurate as of February 09, 2019  3:16 PM. If you have any questions, ask your nurse or doctor.        Adalimumab 40 MG/0.8ML Pnkt Commonly known as: Humira Pen Inject 0.8 mLs into the skin every 14 (fourteen) days. Inject 40 mg (0.8 ml) under the skin every other week.   atorvastatin 10 MG tablet Commonly known as: LIPITOR TAKE 1 TABLET (10 MG TOTAL) BY MOUTH DAILY.   carvedilol 12.5 MG tablet Commonly known as: COREG Take 1 tablet (12.5 mg total) by mouth 2 (two) times daily with a meal.   DULoxetine 60 MG capsule Commonly known as: CYMBALTA Take 2 capsules (120 mg total) by mouth daily.   EPINEPHrine 0.3 mg/0.3 mL Soaj injection Commonly known as: EpiPen 2-Pak Inject 0.3 mLs (0.3 mg total) into the muscle once as needed for up to 1 dose.   fenofibrate micronized 134 MG capsule Commonly known as: LOFIBRA Take 1 tablet by mouth three times weekly. What changed:   how much to take  how to take this  when to take this  additional instructions   FreeStyle Freedom Lite w/Device Kit Use to check blood sugars 3 times daily. Dx code: E11.9   freestyle lancets Use to check blood sugar 3 times daily. Dx code E11.9   gabapentin 600 MG tablet Commonly known as: NEURONTIN Take 1 tablet (600 mg total) by mouth 2 (two) times daily.   Galcanezumab-gnlm 120 MG/ML Soaj Commonly known as: Emgality Inject 1 pen into the skin every 28 (twenty-eight) days.   glimepiride 1 MG tablet Commonly known as: AMARYL TAKE  1 TABLET (1 MG TOTAL) BY MOUTH DAILY BEFORE SUPPER.   glucose blood test strip Commonly known as: Accu-Chek Guide Use to test blood sugar three times daily   hydrocortisone 25 MG suppository Commonly known as: ANUSOL-HC Place 1 suppository (25 mg total) rectally 2 (two) times daily as needed for hemorrhoids.   Insulin Lispro Prot &  Lispro (75-25) 100 UNIT/ML Kwikpen Commonly known as: HumaLOG Mix 75/25 KwikPen 60 units SQ before breakfast, 40 units SQ before lunch, and 50 units SQ at bedtime   Insulin Pen Needle 31G X 5 MM Misc Use for insulin pen twice a day   Iron 325 (65 Fe) MG Tabs Take 1 tablet (325 mg total) by mouth 2 (two) times daily. Take on a empty stomach   levETIRAcetam 750 MG tablet Commonly known as: KEPPRA TAKE 1 TABLET (750 MG TOTAL) BY MOUTH 2 (TWO) TIMES DAILY.   metFORMIN 500 MG tablet Commonly known as: GLUCOPHAGE Take 500 mg by mouth daily. Take 1 tablet by mouth daily in the morning.   omeprazole 40 MG capsule Commonly known as: PRILOSEC Take 1 capsule (40 mg total) by mouth 2 (two) times daily before a meal.   Rybelsus 14 MG Tabs Generic drug: Semaglutide Take 1 tablet by mouth daily before breakfast.   triamcinolone cream 0.1 % Commonly known as: KENALOG Apply 1 application topically daily as needed (rash).       Allergies:  Allergies  Allergen Reactions   Bee Venom Swelling   Hydrocodone-Homatropine Other (See Comments)    Depressed feeling   Morphine And Related Other (See Comments)    Hallucinations, back in the 80s. States has taken vicodin before w/o problems    Sulfa Drugs Cross Reactors Rash    Past Medical History:  Diagnosis Date   Acid reflux    Arthritis    Chronic headaches    on cymbalta   Cirrhosis (HCC)    Colon polyps    Complication of anesthesia    problems waking up from anesthesia   Depression    on cymbalta   Diabetes mellitus with neuropathy ([place])    Diverticulitis 03/2013   Eczema     Elevated LFTs    Fatty liver    History of kidney stones    Hypertension    Insomnia 04/26/2013   Kidney stone    Morbid obesity ([place])    Neuromuscular disorder ([place])    neuropathy   Neuropathy    Post-traumatic hydrocephalus (HCC)    s/p shunts x 2 (first got infected )   Psoriasis    sees Dr [name]   Psoriatic arthritis [facility])    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   see's Dr.Ortmann   Sleep apnea    no CPAP      Stomach ulcer    Testosterone deficiency 04/28/2011    Past Surgical History:  Procedure Laterality Date   BACK SURGERY  1980   CHOLECYSTECTOMY N/A 08/25/2017   Procedure: LAPAROSCOPIC CHOLECYSTECTOMY WITH INTRAOPERATIVE CHOLANGIOGRAM;  Surgeon: [name], MD;  Location: [facility];  Service: General;  Laterality: N/A;   COLONOSCOPY     SHOULDER SURGERY Left 2010   TOE SURGERY Left 2018   TONSILLECTOMY     as a child   TOTAL HIP ARTHROPLASTY Left 2011   VENTRICULOPERITONEAL SHUNT  2007   x2    Family History  Problem Relation Age of Onset   Other Mother    Lung cancer Father        alive, former smoker    Heart disease Brother        MI age 45   Other Brother        Murdered   Down syndrome Son    Diabetes Neg Hx    Prostate cancer Neg Hx    Colon cancer Neg Hx  Stomach cancer Neg Hx    Pancreatic cancer Neg Hx    Liver disease Neg Hx     Social History:  reports that he has never smoked. He has never used smokeless tobacco. He reports that he does not drink alcohol or use drugs.    Review of Systems     HYPERTENSION: Blood pressure  is controlled on carvedilol Followed by PCP   BP Readings from Last 3 Encounters:  01/24/19 137/89  01/23/19 (!) 152/82  01/21/19 (!) 149/103    Lipid history: He is on Lipitor 10 mg with control of LDL previously Is on fenofibrate for triglyceride treatment, has increased triglycerides previously  No recent labs available   Lab Results  Component  Value Date   CHOL 170 05/03/2018   HDL 43.00 05/03/2018   LDLCALC 137 (H) 09/07/2016   LDLDIRECT 89.0 05/03/2018   TRIG 332.0 (H) 05/03/2018   CHOLHDL 4 05/03/2018            Most recent foot exam: 08/2017  He has symptomatic painful neuropathy and is taking gabapentin 600 mg twice a day and  Cymbalta Also has difficulty with balance Has been followed by neurologist    Review of Systems    Physical Examination:  There were no vitals taken for this visit.      ASSESSMENT:  Diabetes type 2 with obesity  See history of present illness for discussion of current diabetes management, blood sugar patterns and problems identified  Currently on a regimen of premixed insulin, Rybelsus and low-dose Amaryl  A1c is last 11%  He has continued need for high doses of insulin despite taking Rybelsus at the same time Highest blood sugars are now after dinner at night and some in the mornings Likely has higher readings with increased carbohydrate intake and when he is drinking more sweet tea Only yesterday his blood sugars were in the normal range because of his skipping his lunch and being more active doing yard work Currently no side effects with 7 mg Rybelsus   PLAN:   Trial of Rybelsus 14 mg  We will increase his dinnertime insulin to 65 units At the same time reduce morning dose to 50 units but continue 40 units at lunch  Again advised him to cut back on portions of high carbohydrate carbohydrate meals and reduce amounts of sweet drinks such as iced tea with sugar He will try to eat his meals on time and not skip any meals No change in metformin until his follow-up renal function is available Surgical clearance approved Follow-up in 6 weeks  There are no Patient Instructions on file for this visit.    Elayne Snare 02/09/2019, 3:16 PM

## 2019-02-10 ENCOUNTER — Telehealth: Payer: Self-pay | Admitting: Endocrinology

## 2019-02-10 NOTE — Telephone Encounter (Signed)
Dylan from [facility] ph# [phone] called re: PA for Humalog-patient is almost out of medication. Insurance will not pay for the RX that was sent to [facility] on 02/09/19 without a PA. The above PHARM is closed on weekends and patient needs medication asap.

## 2019-02-13 ENCOUNTER — Other Ambulatory Visit: Payer: Self-pay | Admitting: Neurosurgery

## 2019-02-13 ENCOUNTER — Other Ambulatory Visit: Payer: Self-pay

## 2019-02-13 MED ORDER — NOVOLOG MIX 70/30 FLEXPEN (70-30) 100 UNIT/ML ~~LOC~~ SUPN: PEN_INJECTOR | SUBCUTANEOUS | 1 refills | Status: DC

## 2019-02-13 NOTE — Telephone Encounter (Signed)
Another PA was requested to be completed to stated that the patient needs a quantity limit exception. This was done and requested as urgent. James [name] (Key: [id number]) Rx #: [id number] NovoLOG Mix 70/30 FlexPen (70-30) 100UNIT/ML pen-injectors   Form [employer] ePA Form

## 2019-02-13 NOTE — Telephone Encounter (Signed)
Sent Rx for Novolog 70/30, same dosage, but Epic stated that this was not covered either. Urgent PA was completed for Humalog 75/25. Pincus Badder Key: [id number] - PA Case ID: [id number] help? Call us at [phone] Status Sent to [facility] HumaLOG Mix 75/25 KwikPen (75-25) 100UNIT/ML pen-injectors Form MedImpact ePA Form

## 2019-02-13 NOTE — Telephone Encounter (Signed)
PA needs to be completed. PA or change medication?

## 2019-02-13 NOTE — Telephone Encounter (Signed)
Ask them if they will cover NovoLog Mix 70/30, same doses

## 2019-02-14 ENCOUNTER — Other Ambulatory Visit: Payer: Self-pay | Admitting: Endocrinology

## 2019-02-14 ENCOUNTER — Telehealth: Payer: Self-pay | Admitting: Endocrinology

## 2019-02-14 ENCOUNTER — Other Ambulatory Visit: Payer: Self-pay | Admitting: Internal Medicine

## 2019-02-14 MED FILL — CARVEDILOL 12.5 MG TABLET: 12.5 | 90 days supply | Qty: 180 | Fill #0

## 2019-02-14 MED FILL — metFORMIN HCL 500 MG TABS: 500 | 90 days supply | Qty: 90 | Fill #0

## 2019-02-14 MED FILL — GLIMEPIRIDE 1 MG TABLET: 1 | 30 days supply | Qty: 30 | Fill #1

## 2019-02-14 NOTE — Telephone Encounter (Signed)
Medical Review called in regards to having questions about patients prior auth on insulin aspart protamine - aspart (NOVOLOG MIX 70/30 FLEXPEN) (70-30) 100 UNIT/ML FlexPen.   How many units per day?  Ph # [phone] Reference # [id number]  Please Advise, Thanks

## 2019-02-15 ENCOUNTER — Other Ambulatory Visit: Payer: Self-pay

## 2019-02-15 ENCOUNTER — Telehealth: Payer: Self-pay | Admitting: Endocrinology

## 2019-02-15 MED ORDER — INSULIN LISPRO PROT & LISPRO (75-25 MIX) 100 UNIT/ML KWIKPEN: PEN_INJECTOR | SUBCUTANEOUS | 11 refills | Status: DC

## 2019-02-15 MED ORDER — INSULIN LISPRO PROT & LISPRO (75-25 MIX) 100 UNIT/ML KWIKPEN: PEN_INJECTOR | SUBCUTANEOUS | 2 refills | Status: DC

## 2019-02-15 MED FILL — BD PEN NDL MINI 31GX5MM: 31G X 5 MM | 25 days supply | Qty: 100 | Fill #1

## 2019-02-15 MED FILL — HUMALOG MIX 75-25 KWIKPEN: (75-25) 100 | 20 days supply | Qty: 30 | Fill #0

## 2019-02-15 NOTE — Telephone Encounter (Signed)
Novolog was d/c'd and PA for Humalog 75/25 was approved.

## 2019-02-15 NOTE — Telephone Encounter (Signed)
Informed patient of information below  MEDICATION: Humalog 75/25  PHARMACY:  [facility] :   IS PATIENT OUT OF MEDICATION: YES  IF NOT; HOW MUCH IS LEFT:   LAST APPOINTMENT DATE: @8 /17/2020  NEXT APPOINTMENT DATE:@Visit  date not found  DO WE HAVE YOUR PERMISSION TO LEAVE A DETAILED MESSAGE:  OTHER COMMENTS:    **Let patient know to contact pharmacy at the end of the day to make sure medication is ready. **  ** Please notify patient to allow 48-72 hours to process**  **Encourage patient to contact the pharmacy for refills or they can request refills through [facility]**

## 2019-02-15 NOTE — Telephone Encounter (Signed)
Rx sent 

## 2019-02-15 NOTE — Telephone Encounter (Signed)
PA was approved for Humalog 75/25 insulin. Pincus Badder Key: [id number] - PA Case ID: [id number] help? Call us at [phone] Outcome Approvedon August 18 The request has been approved. The authorization is effective for a maximum of 12 fills from 02/14/2019 to 02/13/2020, as long as the member is enrolled in their current health plan. The request was reviewed and approved by a licensed clinical pharmacist. This request is approved for 1.64m per day. A written notification letter will follow with additional details. Drug HumaLOG Mix 75/25 KwikPen (75-25) 100UNIT/ML pen-injectors Form [employer] ePA Form

## 2019-02-15 NOTE — Telephone Encounter (Signed)
Patients wife has called in regards to the RX. Another Prior James [name] is needed due to the change of directions. They will be getting the 19 days the pharmacy offered but after that he will be completely out.  Please Advise, Thanks

## 2019-02-16 NOTE — Telephone Encounter (Signed)
Called pharmacy to clarify what the issue is because a PA was approved yesterday. PA was approved for 1.65m per day and pt needs 1.567mper day. New pa is being initiated for 1.5579mer day.

## 2019-02-16 NOTE — Telephone Encounter (Signed)
Checked into status of pt's PA. PT determination was NA because pt was previously approved for 1.28m per day and pt needs 1.564mper day. Insurance stated that they will not approved the 1.558mer day because they did not approve the maximum qty per month, which is 2 boxes of pens. Spoke with pharmacist Phil at [facility] he stated that according to what he can see, the pt can get the amount of insulin he needs, he just cannot get a full 30 day supply at one time. Pt can only get 19 day supply at on time, but the 2 boxes he gets per 19 days is sufficient for his dosing. Pt's wife was called and informed of this and she verbalized understanding.

## 2019-02-16 NOTE — Telephone Encounter (Signed)
PA initiated for quantity limit exception for Humalog 75/25 via [email or web address]. James [name] Key: [id number] - PA Case ID: [id number] - Rx #: [id number] Need help? Call us at [phone] Status Sent to [facility] HumaLOG Mix 75/25 KwikPen (75-25) 100UNIT/ML pen-injectors Form Med[employer] ePA Form Original Claim Info 9G Quantity Dispensed Exceeds Maximum Allowed

## 2019-02-17 NOTE — Telephone Encounter (Signed)
Received PA exception form via fax today as well as form for [email or web address] for the same exception on Humalog 75/25. The form was filled out and sent back immediately. PA on [email or web address] was completed as well in an attempt to ensure pt gets the appropriate amount of insulin that he requires. Jaylee Soltero Key: [id number] - PA Case ID: [id number] - Rx #: [id number] Need help? Call us at [phone] Status Sent to [facility] HumaLOG Mix 75/25 KwikPen (75-25) 100UNIT/ML pen-injectors Form MedImpact ePA Form Original Claim Info 9G Quantity Dispensed Exceeds Maximum Allowed

## 2019-02-20 ENCOUNTER — Telehealth: Payer: Self-pay

## 2019-02-20 NOTE — Telephone Encounter (Signed)
Called pt's wife and gave her new instructions for pt's insulin dosing. Pt's wife verbalized understanding. Also called pt and informed him of new dosing instructions and he verbalized understanding of this and wrote the instructions down as well. Pt also checked his blood sugar while he was on the phone with me and it was noted to be 156 and he states that his headache is not as severe as it was when his blood sugar was low. Pt was encouraged to call office for anything he should need and he verbalized understanding of this.

## 2019-02-20 NOTE — Telephone Encounter (Signed)
Pt's wife called and stated pt is now experiencing low blood sugars. Wife states that she thinks the pt is taking insulin regardless of whether he eats or not. Pt's wife stated that she thinks the lowest blood sugar he has had recently was 50, and it has been in the 60's range all day today.  Pt's wife would not allow pt to give himself insulin yesterday evening because blood sugar was in the 60's. Wife states that she just spoke with the pt and he had a sever headache.  Called pt and he stated that he was having orange juice and a salmi sandwich to bring his blood sugar up. He states that he has been having these issues since Friday and throughout the weekend. I got blood sugar logs from the pt since Friday and will give to Dr. [name] for review. Pt did confirm that this morning, he did not eat after taking his insulin.  Dr. [name], please advise on blood sugars that were placed on your desk. Pt is taking insulin as directed at last appt. (50, 40, 65)

## 2019-02-20 NOTE — Telephone Encounter (Signed)
He can reduce his dosage to 40 units in the morning, 35 at lunch and 60 at dinnertime. He needs to make sure he is eating when he takes his insulin.   Need to get some diabetic boost or Glucerna to consume if he does not feel like eating. Also if he is not eating a meal he can reduce his dose by 10 units at that time

## 2019-02-21 NOTE — Telephone Encounter (Signed)
Received fax from [place] stating that they did approve pt's request to increase daily max dosage of insulin from 1.39m per day to 268mper day. Pt's wife and left voicemail requesting a call back in order to be notified.

## 2019-02-22 ENCOUNTER — Other Ambulatory Visit: Payer: Self-pay

## 2019-02-22 MED ORDER — GLUCOSE BLOOD VI STRP: ORAL_STRIP | 2 refills | Status: DC

## 2019-02-22 MED FILL — FREESTYLE LITE TEST STRIP: 25 days supply | Qty: 100 | Fill #0

## 2019-02-22 MED FILL — DULOXETINE HCL 60 MG CPEP: 60 | 30 days supply | Qty: 60 | Fill #1

## 2019-02-23 ENCOUNTER — Ambulatory Visit (INDEPENDENT_AMBULATORY_CARE_PROVIDER_SITE_OTHER): Payer: 59 | Admitting: Cardiology

## 2019-02-23 ENCOUNTER — Other Ambulatory Visit: Payer: Self-pay

## 2019-02-23 ENCOUNTER — Encounter: Payer: Self-pay | Admitting: Cardiology

## 2019-02-23 VITALS — BP 138/76 | HR 74 | Ht 71.0 in | Wt 273.0 lb

## 2019-02-23 DIAGNOSIS — N182 Chronic kidney disease, stage 2 (mild): Secondary | ICD-10-CM

## 2019-02-23 DIAGNOSIS — I2510 Atherosclerotic heart disease of native coronary artery without angina pectoris: Secondary | ICD-10-CM | POA: Diagnosis not present

## 2019-02-23 DIAGNOSIS — G4733 Obstructive sleep apnea (adult) (pediatric): Secondary | ICD-10-CM | POA: Diagnosis not present

## 2019-02-23 DIAGNOSIS — E1122 Type 2 diabetes mellitus with diabetic chronic kidney disease: Secondary | ICD-10-CM | POA: Diagnosis not present

## 2019-02-23 NOTE — Addendum Note (Signed)
Addended by: Ashok Norris on: 02/23/2019 02:19 PM   Modules accepted: Orders

## 2019-02-23 NOTE — Progress Notes (Signed)
Cardiology Office Note:    Date:  02/23/2019   ID:  James [name], DOB Feb 25, 1956, MRN [id number]  PCP:  Colon Branch, MD  Cardiologist:  Jenne Campus, MD    Referring MD: Colon Branch, MD   Chief Complaint  Patient presents with  . Follow-up    History of Present Illness:    James [name] is a 64 y.o. male he was referred to [place] because of abnormal EKG that was identified during the screening before surgery.  EKG suggested old myocardial infarction.  I did echocardiogram on him however echocardiogram being somewhat poor in quality did not show significant segmental motion abnormalities.  He complained of having some chest pain which is not exercise related the biggest problem with exercising he have to is issue with his back.  Denies having any shortness of breath.  We talked in length about what to do with the situation and the idea was if he truly had myocardial infarction then cardiac catheterization will be done if not stress testing.  Therefore, we will proceed with stress testing.  Past Medical History:  Diagnosis Date  . Acid reflux   . Arthritis   . Chronic headaches    on cymbalta  . Cirrhosis ([place])   . Colon polyps   . Complication of anesthesia    problems waking up from anesthesia  . Depression    on cymbalta  . Diabetes mellitus with neuropathy ([place])   . Diverticulitis 03/2013  . Eczema   . Elevated LFTs   . Fatty liver   . History of kidney stones   . Hypertension   . Insomnia 04/26/2013  . Kidney stone   . Morbid obesity ([place])   . Neuromuscular disorder (HCC)    neuropathy  . Neuropathy   . Post-traumatic hydrocephalus (HCC)    s/p shunts x 2 (first got infected )  . Psoriasis    sees Dr [name]  . Psoriatic arthritis ([place])   . 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 Sleep apnea    no CPAP     . Stomach ulcer   . Testosterone deficiency 04/28/2011    Past Surgical History:  Procedure Laterality Date  .  BACK SURGERY  1980  . CHOLECYSTECTOMY N/A 08/25/2017   Procedure: LAPAROSCOPIC CHOLECYSTECTOMY WITH INTRAOPERATIVE CHOLANGIOGRAM;  Surgeon: [name], MD;  Location: [facility];  Service: General;  Laterality: N/A;  . COLONOSCOPY    . SHOULDER SURGERY Left 2010  . TOE SURGERY Left 2018  . TONSILLECTOMY     as a child  . TOTAL HIP ARTHROPLASTY Left 2011  . VENTRICULOPERITONEAL SHUNT  2007   x2    Current Medications: Current Meds  Medication Sig  . Adalimumab (HUMIRA PEN) 40 MG/0.8ML PNKT Inject 0.8 mLs into the skin every 14 (fourteen) days. Inject 40 mg (0.8 ml) under the skin every other week.  Marland Kitchen atorvastatin (LIPITOR) 10 MG tablet TAKE 1 TABLET (10 MG TOTAL) BY MOUTH DAILY.  Marland Kitchen Blood Glucose Monitoring Suppl (FREESTYLE FREEDOM LITE) w/Device KIT Use to check blood sugars 3 times daily. Dx code: E11.9  . carvedilol (COREG) 12.5 MG tablet Take 1 tablet (12.5 mg total) by mouth 2 (two) times daily with a meal.  . DULoxetine (CYMBALTA) 60 MG capsule Take 2 capsules (120 mg total) by mouth daily.  Marland Kitchen EPINEPHrine (EPIPEN 2-PAK) 0.3 mg/0.3 mL IJ SOAJ injection Inject 0.3 mLs (0.3 mg total) into the muscle once as  needed for up to 1 dose.  . fenofibrate micronized (LOFIBRA) 134 MG capsule Take 1 tablet by mouth three times weekly. (Patient taking differently: Take 134 mg by mouth 3 (three) times a week. )  . Ferrous Sulfate (IRON) 325 (65 Fe) MG TABS Take 1 tablet (325 mg total) by mouth 2 (two) times daily. Take on a empty stomach  . gabapentin (NEURONTIN) 600 MG tablet Take 1 tablet (600 mg total) by mouth 2 (two) times daily.  . Galcanezumab-gnlm (EMGALITY) 120 MG/ML SOAJ Inject 1 pen into the skin every 28 (twenty-eight) days.  Marland Kitchen glimepiride (AMARYL) 1 MG tablet TAKE 1 TABLET (1 MG TOTAL) BY MOUTH DAILY BEFORE SUPPER.  Marland Kitchen glucose blood test strip Use Freestyle Freedom Lite test strips as instructed to check blood sugar 4 times daily.  . Insulin Lispro Prot & Lispro (HUMALOG MIX 75/25 KWIKPEN)  (75-25) 100 UNIT/ML Kwikpen Inject 50 units under the skin at breakfast, 40 at lunch, and 65 at dinner.  . Insulin Pen Needle 31G X 5 MM MISC Use for insulin pen twice a day  . Lancets (FREESTYLE) lancets Use to check blood sugar 3 times daily. Dx code E11.9  . levETIRAcetam (KEPPRA) 750 MG tablet TAKE 1 TABLET (750 MG TOTAL) BY MOUTH 2 (TWO) TIMES DAILY.  . metFORMIN (GLUCOPHAGE) 500 MG tablet TAKE 1 TABLET BY MOUTH DAILY WITH A MEAL  . omeprazole (PRILOSEC) 40 MG capsule Take 1 capsule (40 mg total) by mouth 2 (two) times daily before a meal.  . Semaglutide (RYBELSUS) 14 MG TABS Take 1 tablet by mouth daily. Take 1 tablet by mouth once daily.     Allergies:   Bee venom, Hydrocodone-homatropine, Morphine and related, and Sulfa drugs cross reactors   Social History   Socioeconomic History  . Marital status: Married    Spouse name: [name]  . Number of children: 2  . Years of education: Not on file  . Highest education level: Not on file  Occupational History  . Occupation: disable   Social Needs  . Financial resource strain: Not on file  . Food insecurity    Worry: Not on file    Inability: Not on file  . Transportation needs    Medical: Not on file    Non-medical: Not on file  Tobacco Use  . Smoking status: Never Smoker  . Smokeless tobacco: Never Used  Substance and Sexual Activity  . Alcohol use: No  . Drug use: No  . Sexual activity: Yes    Partners: Female  Lifestyle  . Physical activity    Days per week: Not on file    Minutes per session: Not on file  . Stress: Not on file  Relationships  . Social Herbalist on phone: Not on file    Gets together: Not on file    Attends religious service: Not on file    Active member of club or organization: Not on file    Attends meetings of clubs or organizations: Not on file    Relationship status: Not on file  Other Topics Concern  . 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     Family History: The patient's family history includes Down syndrome in his son; Heart disease in his brother; Lung cancer in his father; Other in  his brother and mother. There is no history of Diabetes, Prostate cancer, Colon cancer, Stomach cancer, Pancreatic cancer, or Liver disease. ROS:   Please see the history of present illness.    All 14 point review of systems negative except as described per history of present illness  EKGs/Labs/Other Studies Reviewed:      Recent Labs: 06/10/2018: TSH 2.34 01/16/2019: ALT 45 01/24/2019: Hemoglobin 11.4; Platelets 150.0 01/26/2019: BUN 21; Creatinine 1.8; Potassium 5.2; Sodium 135  Recent Lipid Panel    Component Value Date/Time   CHOL 170 05/03/2018 1048   TRIG 332.0 (H) 05/03/2018 1048   HDL 43.00 05/03/2018 1048   CHOLHDL 4 05/03/2018 1048   VLDL 66.4 (H) 05/03/2018 1048   LDLCALC 137 (H) 09/07/2016 1025   LDLDIRECT 89.0 05/03/2018 1048    Physical Exam:    VS:  BP 138/76   Pulse 74   Ht 5' 11"  (1.803 m)   Wt 273 lb (123.8 kg)   SpO2 97%   BMI 38.08 kg/m     Wt Readings from Last 3 Encounters:  02/23/19 273 lb (123.8 kg)  01/24/19 266 lb 2 oz (120.7 kg)  01/23/19 270 lb 6.4 oz (122.7 kg)     GEN:  Well nourished, well developed in no acute distress HEENT: Normal NECK: No JVD; No carotid bruits LYMPHATICS: No lymphadenopathy CARDIAC: RRR, no murmurs, no rubs, no gallops RESPIRATORY:  Clear to auscultation without rales, wheezing or rhonchi  ABDOMEN: Soft, non-tender, non-distended MUSCULOSKELETAL:  No edema; No deformity  SKIN: Warm and dry LOWER EXTREMITIES: no swelling NEUROLOGIC:  Alert and oriented x 3 PSYCHIATRIC:  Normal affect   ASSESSMENT:    1. Coronary artery disease involving native coronary artery of native heart without angina pectoris   2. Type 2 diabetes mellitus with  stage 2 chronic kidney disease, without long-term current use of insulin ([place])   3. OSA -- dx ~ 2012, cpap intolerant    PLAN:    In order of problems listed above:  1. Coronary disease.  EKG showing possibility of old myocardial infarction.  Stress test will be done to clarify the diagnosis as well as stratified. 2. Type 2 diabetes followed by 10 medicine team better but still some room for improvement 3. Obstructive sleep apnea followed by 10 medicine team he is intolerant to CPAP. 4. Dyslipidemia he is on Lipitor which I will continue.   Medication Adjustments/Labs and Tests Ordered: Current medicines are reviewed at length with the patient today.  Concerns regarding medicines are outlined above.  No orders of the defined types were placed in this encounter.  Medication changes: No orders of the defined types were placed in this encounter.   Signed, Park Liter, MD, [facility] 02/23/2019 2:03 PM    [place]

## 2019-02-23 NOTE — Patient Instructions (Signed)
Medication Instructions:  Your physician recommends that you continue on your current medications as directed. Please refer to the Current Medication list given to you today.  If you need a refill on your cardiac medications before your next appointment, please call your pharmacy.   Lab work: Your physician recommends that you continue on your current medications as directed. Please refer to the Current Medication list given to you today.  If you have labs (blood work) drawn today and your tests are completely normal, you will receive your results only by: Marland Kitchen MyChart Message (if you have MyChart) OR . A paper copy in the mail If you have any lab test that is abnormal or we need to change your treatment, we will call you to review the results.  Testing/Procedures: Your physician has requested that you have a lexiscan myoview. For further information please visit [email or web address]. Please follow instruction sheet, as given.    Follow-Up: At [facility], you and your health needs are our priority.  As part of our continuing mission to provide you with exceptional heart care, we have created designated Provider Care Teams.  These Care Teams include your primary Cardiologist (physician) and Advanced Practice Providers (APPs -  Physician Assistants and Nurse Practitioners) who all work together to provide you with the care you need, when you need it. You will need a follow up appointment in 3 months.  Please call our office 2 months in advance to schedule this appointment.  You may see No primary care provider on file. or another member of our [employer] Provider Team in [place]: Shirlee More, MD . Jyl Heinz, MD  Any Other Special Instructions Will Be Listed Below (If Applicable).   Cardiac Nuclear Scan A cardiac nuclear scan is a test that measures blood flow to the heart when a person is resting and when he or she is exercising. The test looks for problems such as:  Not  enough blood reaching a portion of the heart.  The heart muscle not working normally. You may need this test if:  You have heart disease.  You have had abnormal lab results.  You have had heart surgery or a balloon procedure to open up blocked arteries (angioplasty).  You have chest pain.  You have shortness of breath. In this test, a radioactive dye (tracer) is injected into your bloodstream. After the tracer has traveled to your heart, an imaging device is used to measure how much of the tracer is absorbed by or distributed to various areas of your heart. This procedure is usually done at a hospital and takes 2-4 hours. Tell a health care provider about:  Any allergies you have.  All medicines you are taking, including vitamins, herbs, eye drops, creams, and over-the-counter medicines.  Any problems you or family members have had with anesthetic medicines.  Any blood disorders you have.  Any surgeries you have had.  Any medical conditions you have.  Whether you are pregnant or may be pregnant. What are the risks? Generally, this is a safe procedure. However, problems may occur, including:  Serious chest pain and heart attack. This is only a risk if the stress portion of the test is done.  Rapid heartbeat.  Sensation of warmth in your chest. This usually passes quickly.  Allergic reaction to the tracer. What happens before the procedure?  Ask your health care provider about changing or stopping your regular medicines. This is especially important if you are taking diabetes medicines or blood thinners.  Follow instructions from your health care provider about eating or drinking restrictions.  Remove your jewelry on the day of the procedure. What happens during the procedure?  An IV will be inserted into one of your veins.  Your health care provider will inject a small amount of radioactive tracer through the IV.  You will wait for 20-40 minutes while the tracer  travels through your bloodstream.  Your heart activity will be monitored with an electrocardiogram (ECG).  You will lie down on an exam table.  Images of your heart will be taken for about 15-20 minutes.  You may also have a stress test. For this test, one of the following may be done: ? You will exercise on a treadmill or stationary bike. While you exercise, your heart's activity will be monitored with an ECG, and your blood pressure will be checked. ? You will be given medicines that will increase blood flow to parts of your heart. This is done if you are unable to exercise.  When blood flow to your heart has peaked, a tracer will again be injected through the IV.  After 20-40 minutes, you will get back on the exam table and have more images taken of your heart.  Depending on the type of tracer used, scans may need to be repeated 3-4 hours later.  Your IV line will be removed when the procedure is over. The procedure may vary among health care providers and hospitals. What happens after the procedure?  Unless your health care provider tells you otherwise, you may return to your normal schedule, including diet, activities, and medicines.  Unless your health care provider tells you otherwise, you may increase your fluid intake. This will help to flush the contrast dye from your body. Drink enough fluid to keep your urine pale yellow.  Ask your health care provider, or the department that is doing the test: ? When will my results be ready? ? How will I get my results? Summary  A cardiac nuclear scan measures the blood flow to the heart when a person is resting and when he or she is exercising.  Tell your health care provider if you are pregnant.  Before the procedure, ask your health care provider about changing or stopping your regular medicines. This is especially important if you are taking diabetes medicines or blood thinners.  After the procedure, unless your health care  provider tells you otherwise, increase your fluid intake. This will help flush the contrast dye from your body.  After the procedure, unless your health care provider tells you otherwise, you may return to your normal schedule, including diet, activities, and medicines. This information is not intended to replace advice given to you by your health care provider. Make sure you discuss any questions you have with your health care provider. Document Released: 07/10/2004 Document Revised: 11/29/2017 Document Reviewed: 11/29/2017 Elsevier Patient Education  2020 [employer].

## 2019-02-24 ENCOUNTER — Telehealth (HOSPITAL_COMMUNITY): Payer: Self-pay

## 2019-02-24 DIAGNOSIS — L409 Psoriasis, unspecified: Secondary | ICD-10-CM | POA: Diagnosis not present

## 2019-02-24 LAB — HEPATIC FUNCTION PANEL
ALT: 25 (ref 10–40)
AST: 31 (ref 14–40)
Alkaline Phosphatase: 56 (ref 25–125)
Bilirubin, Total: 0.6

## 2019-02-24 NOTE — Telephone Encounter (Signed)
Encounter complete. 

## 2019-02-27 LAB — BASIC METABOLIC PANEL
BUN: 21 (ref 4–21)
Creatinine: 1.7 — AB (ref 0.6–1.3)
Glucose: 189
Potassium: 4.6 (ref 3.4–5.3)
Sodium: 139 (ref 137–147)

## 2019-02-28 ENCOUNTER — Other Ambulatory Visit: Payer: Self-pay | Admitting: Pharmacist

## 2019-02-28 MED ORDER — HUMIRA PEN 40 MG/0.8ML ~~LOC~~ PNKT: 0.8000 mL | PEN_INJECTOR | SUBCUTANEOUS | 5 refills | Status: DC

## 2019-02-28 MED FILL — HUMIRA PEN 40 MG/0.8ML PNKT: 40 | 28 days supply | Qty: 2 | Fill #0

## 2019-03-01 ENCOUNTER — Ambulatory Visit (HOSPITAL_COMMUNITY)
Admission: RE | Admit: 2019-03-01 | Discharge: 2019-03-01 | Disposition: A | Discharge status: Home / Self Care | Payer: 59 | Source: Ambulatory Visit | Attending: Cardiology | Admitting: Cardiology

## 2019-03-01 ENCOUNTER — Other Ambulatory Visit: Payer: Self-pay

## 2019-03-01 DIAGNOSIS — I2510 Atherosclerotic heart disease of native coronary artery without angina pectoris: Secondary | ICD-10-CM | POA: Insufficient documentation

## 2019-03-01 LAB — MYOCARDIAL PERFUSION IMAGING
LV dias vol: 105 mL (ref 62–150)
LV sys vol: 52 mL
Peak HR: 105 {beats}/min
Rest HR: 74 {beats}/min
SDS: 1
SRS: 0
SSS: 1
TID: 1.18

## 2019-03-01 MED ORDER — TECHNETIUM TC 99M TETROFOSMIN IV KIT
31.6000 | PACK | Freq: Once | INTRAVENOUS | Status: AC | PRN
  Administered 2019-03-01: 31.6 via INTRAVENOUS
  Filled 2019-03-01: qty 32

## 2019-03-01 MED ORDER — REGADENOSON 0.4 MG/5ML IV SOLN
0.4000 mg | Freq: Once | INTRAVENOUS | Status: AC
  Administered 2019-03-01: 0.4 mg via INTRAVENOUS

## 2019-03-01 MED ORDER — AMINOPHYLLINE 25 MG/ML IV SOLN
75.0000 mg | Freq: Once | INTRAVENOUS | Status: AC
  Administered 2019-03-01: 75 mg via INTRAVENOUS

## 2019-03-01 MED ORDER — TECHNETIUM TC 99M TETROFOSMIN IV KIT
10.2000 | PACK | Freq: Once | INTRAVENOUS | Status: AC | PRN
  Administered 2019-03-01: 10.2 via INTRAVENOUS
  Filled 2019-03-01: qty 11

## 2019-03-03 MED FILL — FENOFIBRATE 134 MG CAPSULE: 134 | 51 days supply | Qty: 22 | Fill #1

## 2019-03-03 MED FILL — GABAPENTIN 600 MG TABS: 600 | 30 days supply | Qty: 60 | Fill #2

## 2019-03-03 MED FILL — HUMALOG MIX 75-25 KWIKPEN: (75-25) 100 | 31 days supply | Qty: 48 | Fill #1

## 2019-03-03 MED FILL — levETIRAcetam 750 MG TABS: 750 | 30 days supply | Qty: 60 | Fill #5

## 2019-03-07 NOTE — Progress Notes (Signed)
[place], [address] [place] [address] Phone: [phone] Fax: [phone]      Your procedure is scheduled on March 16, 2019.  Report to [facility] Main Entrance "A" at 10:00 A.M., and check in at the Admitting office.  Call this number if you have problems the morning of surgery:  [phone]  Call [phone] if you have any questions prior to your surgery date Monday-Friday 8am-4pm    Remember:  Do not eat or drink after midnight the night before your surgery    Take these medicines the morning of surgery with A SIP OF WATER : Carvedilol (Coreg) Fenofibrate (Lofibra) Gabapentin (Neurontin) Levetiracetam (Keppra)  Atorvastatin (Lipitor) Omeprazole (Prilosec) Epi Pen if needed  7 days prior to surgery STOP taking any Aspirin (unless otherwise instructed by your surgeon), Aleve, Naproxen, Ibuprofen, Motrin, Advil, Goody's, BC's, all herbal medications, fish oil, and all vitamins.   WHAT DO I DO ABOUT MY DIABETES MEDICATION?   Marland Kitchen Do not take oral diabetes medicines (pills) the morning of surgery. Do NOT take Glimepiride (Amaryl), Semaglutide (Rybelsus), OR Metformin (Glucophage) the morning of surgery.  . THE NIGHT BEFORE SURGERY, DO NOT take evening dose of Glimepiride (Amaryl).  Take 45 units of your Insulin Lispro 75/25 insulin.   . THE MORNING OF SURGERY, DO NOT TAKE your Insulin Lispro 75/25.  . The day of surgery, do not take other diabetes injectables, including Byetta (exenatide), Bydureon (exenatide ER), Victoza (liraglutide), or Trulicity (dulaglutide).  . If your CBG is greater than 220 mg/dL, you may take  of your sliding scale (correction) dose of insulin.   How to Manage Your Diabetes Before and After Surgery  Why is it important to control my blood sugar before and after surgery? . Improving blood sugar levels before and after surgery helps healing  and can limit problems. . A way of improving blood sugar control is eating a healthy diet by: o  Eating less sugar and carbohydrates o  Increasing activity/exercise o  Talking with your doctor about reaching your blood sugar goals . High blood sugars (greater than 180 mg/dL) can raise your risk of infections and slow your recovery, so you will need to focus on controlling your diabetes during the weeks before surgery. . Make sure that the doctor who takes care of your diabetes knows about your planned surgery including the date and location.  How do I manage my blood sugar before surgery? . Check your blood sugar at least 4 times a day, starting 2 days before surgery, to make sure that the level is not too high or low. o Check your blood sugar the morning of your surgery when you wake up and every 2 hours until you get to the Short Stay unit. . If your blood sugar is less than 70 mg/dL, you will need to treat for low blood sugar: o Do not take insulin. o Treat a low blood sugar (less than 70 mg/dL) with  cup of clear juice (cranberry or apple), 4 glucose tablets, OR glucose gel. o Recheck blood sugar in 15 minutes after treatment (to make sure it is greater than 70 mg/dL). If your blood sugar is not greater than 70 mg/dL on recheck, call [phone] for further instructions. . Report your blood sugar to the short stay nurse when you get to Short Stay.  . If you are admitted to the hospital after surgery: o  Your blood sugar will be checked by the staff and you will probably be given insulin after surgery (instead of oral diabetes medicines) to make sure you have good blood sugar levels. o The goal for blood sugar control after surgery is 80-180 mg/dL.    The Morning of Surgery  Do not wear jewelry.  Do not wear lotions, powders, or perfumes/colognes, or deodorant  Do not shave 48 hours prior to surgery.  Men may shave face and neck.  Do not bring valuables to the hospital.  [facility] is  not responsible for any belongings or valuables.  If you are a smoker, DO NOT Smoke 24 hours prior to surgery IF you wear a CPAP at night please bring your mask, tubing, and machine the morning of surgery   Remember that you must have someone to transport you home after your surgery, and remain with you for 24 hours if you are discharged the same day.   Contacts, glasses, hearing aids, dentures or bridgework may not be worn into surgery.    Leave your suitcase in the car.  After surgery it may be brought to your room.  For patients admitted to the hospital, discharge time will be determined by your treatment team.  Patients discharged the day of surgery will not be allowed to drive home.    Special instructions:   [place]- Preparing For Surgery  Before surgery, you can play an important role. Because skin is not sterile, your skin needs to be as free of germs as possible. You can reduce the number of germs on your skin by washing with CHG (chlorahexidine gluconate) Soap before surgery.  CHG is an antiseptic cleaner which kills germs and bonds with the skin to continue killing germs even after washing.    Oral Hygiene is also important to reduce your risk of infection.  Remember - BRUSH YOUR TEETH THE MORNING OF SURGERY WITH YOUR REGULAR TOOTHPASTE  Please do not use if you have an allergy to CHG or antibacterial soaps. If your skin becomes reddened/irritated stop using the CHG.  Do not shave (including legs and underarms) for at least 48 hours prior to first CHG shower. It is OK to shave your face.  Please follow these instructions carefully.   1. Shower the NIGHT BEFORE SURGERY and the MORNING OF SURGERY with CHG Soap.   2. If you chose to wash your hair, wash your hair first as usual with your normal shampoo.  3. After you shampoo, rinse your hair and body thoroughly to remove the shampoo.  4. Use CHG as you would any other liquid soap. You can apply CHG directly to the skin  and wash gently with a scrungie or a clean washcloth.   5. Apply the CHG Soap to your body ONLY FROM THE NECK DOWN.  Do not use on open wounds or open sores. Avoid contact with your eyes, ears, mouth and genitals (private parts). Wash Face and genitals (private parts)  with your normal soap.   6. Wash thoroughly, paying special attention to the area where your surgery will be performed.  7. Thoroughly rinse your body with warm water from the neck down.  8. DO NOT shower/wash with your normal soap after using and rinsing off the CHG Soap.  9. Pat yourself dry with a CLEAN TOWEL.  10. Wear CLEAN PAJAMAS to bed the night before surgery, wear comfortable clothes the morning of surgery  11. Place CLEAN SHEETS on your bed the night of  your first shower and DO NOT SLEEP WITH PETS.    Day of Surgery:  Do not apply any deodorants/lotions. Please shower the morning of surgery with the CHG soap  Please wear clean clothes to the hospital/surgery center.   Remember to brush your teeth WITH YOUR REGULAR TOOTHPASTE.   Please read over the following fact sheets that you were given.

## 2019-03-08 ENCOUNTER — Encounter (HOSPITAL_COMMUNITY): Payer: Self-pay

## 2019-03-08 ENCOUNTER — Other Ambulatory Visit: Payer: Self-pay

## 2019-03-08 ENCOUNTER — Encounter (HOSPITAL_COMMUNITY)
Admission: RE | Admit: 2019-03-08 | Discharge: 2019-03-08 | Disposition: A | Discharge status: Home / Self Care | Payer: 59 | Source: Ambulatory Visit | Attending: Neurosurgery | Admitting: Neurosurgery

## 2019-03-08 DIAGNOSIS — Z01812 Encounter for preprocedural laboratory examination: Secondary | ICD-10-CM | POA: Diagnosis not present

## 2019-03-08 DIAGNOSIS — Z0184 Encounter for antibody response examination: Secondary | ICD-10-CM | POA: Diagnosis not present

## 2019-03-08 LAB — CBC
HCT: 38.2 % — ABNORMAL LOW (ref 39.0–52.0)
Hemoglobin: 11.9 g/dL — ABNORMAL LOW (ref 13.0–17.0)
MCH: 27.2 pg (ref 26.0–34.0)
MCHC: 31.2 g/dL (ref 30.0–36.0)
MCV: 87.2 fL (ref 80.0–100.0)
Platelets: 140 10*3/uL — ABNORMAL LOW (ref 150–400)
RBC: 4.38 MIL/uL (ref 4.22–5.81)
RDW: 16.6 % — ABNORMAL HIGH (ref 11.5–15.5)
WBC: 4 10*3/uL (ref 4.0–10.5)
nRBC: 0 % (ref 0.0–0.2)

## 2019-03-08 LAB — PROTIME-INR
INR: 1.1 (ref 0.8–1.2)
Prothrombin Time: 13.7 seconds (ref 11.4–15.2)

## 2019-03-08 LAB — COMPREHENSIVE METABOLIC PANEL
ALT: 37 U/L (ref 0–44)
AST: 34 U/L (ref 15–41)
Albumin: 3.7 g/dL (ref 3.5–5.0)
Alkaline Phosphatase: 69 U/L (ref 38–126)
Anion gap: 5 (ref 5–15)
BUN: 19 mg/dL (ref 8–23)
CO2: 26 mmol/L (ref 22–32)
Calcium: 9 mg/dL (ref 8.9–10.3)
Chloride: 108 mmol/L (ref 98–111)
Creatinine, Ser: 1.47 mg/dL — ABNORMAL HIGH (ref 0.61–1.24)
GFR calc Af Amer: 59 mL/min — ABNORMAL LOW (ref >60)
GFR calc non Af Amer: 51 mL/min — ABNORMAL LOW (ref >60)
Glucose, Bld: 144 mg/dL — ABNORMAL HIGH (ref 70–99)
Potassium: 4.5 mmol/L (ref 3.5–5.1)
Sodium: 139 mmol/L (ref 135–145)
Total Bilirubin: 0.7 mg/dL (ref 0.3–1.2)
Total Protein: 7.6 g/dL (ref 6.5–8.1)

## 2019-03-08 LAB — SURGICAL PCR SCREEN
MRSA, PCR: POSITIVE — AB
Staphylococcus aureus: POSITIVE — AB

## 2019-03-08 LAB — GLUCOSE, CAPILLARY: Glucose-Capillary: 162 mg/dL — ABNORMAL HIGH (ref 70–99)

## 2019-03-08 MED FILL — MUPIROCIN 2% OINTMENT: 2 | 5 days supply | Qty: 22 | Fill #0

## 2019-03-08 NOTE — Progress Notes (Signed)
[place], [address] [place] [address] Phone: [phone] Fax: [phone]      Your procedure is scheduled on March 16, 2019.  Report to [facility] Main Entrance "A" at 10:00 A.M., and check in at the Admitting office.  Call this number if you have problems the morning of surgery:  [phone]  Call [phone] if you have any questions prior to your surgery date Monday-Friday 8am-4pm    Remember:  Do not eat or drink after midnight the night before your surgery    Take these medicines the morning of surgery with A SIP OF WATER : Carvedilol (Coreg) Fenofibrate (Lofibra) Gabapentin (Neurontin) Levetiracetam (Keppra)  Atorvastatin (Lipitor) Omeprazole (Prilosec) Epi Pen if needed  7 days prior to surgery STOP taking any Aspirin (unless otherwise instructed by your surgeon), Aleve, Naproxen, Ibuprofen, Motrin, Advil, Goody's, BC's, all herbal medications, fish oil, and all vitamins.   WHAT DO I DO ABOUT MY DIABETES MEDICATION?   Marland Kitchen Do not take oral diabetes medicines (pills) the morning of surgery. Do NOT take Glimepiride (Amaryl), Semaglutide (Rybelsus), OR Metformin (Glucophage) the morning of surgery.  . THE NIGHT BEFORE SURGERY, DO NOT take evening dose of Glimepiride (Amaryl).  Take 42 units of your Insulin Lispro 75/25 insulin.   . THE MORNING OF SURGERY, DO NOT TAKE your Insulin Lispro 75/25.  . The day of surgery, do not take other diabetes injectables, including Byetta (exenatide), Bydureon (exenatide ER), Victoza (liraglutide), or Trulicity (dulaglutide).  . If your CBG is greater than 220 mg/dL, you may take  of your sliding scale (correction) dose of insulin.   How to Manage Your Diabetes Before and After Surgery  Why is it important to control my blood sugar before and after surgery? . Improving blood sugar levels before and after surgery helps healing  and can limit problems. . A way of improving blood sugar control is eating a healthy diet by: o  Eating less sugar and carbohydrates o  Increasing activity/exercise o  Talking with your doctor about reaching your blood sugar goals . High blood sugars (greater than 180 mg/dL) can raise your risk of infections and slow your recovery, so you will need to focus on controlling your diabetes during the weeks before surgery. . Make sure that the doctor who takes care of your diabetes knows about your planned surgery including the date and location.  How do I manage my blood sugar before surgery? . Check your blood sugar at least 4 times a day, starting 2 days before surgery, to make sure that the level is not too high or low. o Check your blood sugar the morning of your surgery when you wake up and every 2 hours until you get to the Short Stay unit. . If your blood sugar is less than 70 mg/dL, you will need to treat for low blood sugar: o Do not take insulin. o Treat a low blood sugar (less than 70 mg/dL) with  cup of clear juice (cranberry or apple), 4 glucose tablets, OR glucose gel. o Recheck blood sugar in 15 minutes after treatment (to make sure it is greater than 70 mg/dL). If your blood sugar is not greater than 70 mg/dL on recheck, call [phone] for further instructions. . Report your blood sugar to the short stay nurse when you get to Short Stay.  . If you are admitted to the hospital after surgery: o  Your blood sugar will be checked by the staff and you will probably be given insulin after surgery (instead of oral diabetes medicines) to make sure you have good blood sugar levels. o The goal for blood sugar control after surgery is 80-180 mg/dL.    The Morning of Surgery  Do not wear jewelry.  Do not wear lotions, powders, or perfumes/colognes, or deodorant  Do not shave 48 hours prior to surgery.  Men may shave face and neck.  Do not bring valuables to the hospital.  [facility] is  not responsible for any belongings or valuables.  If you are a smoker, DO NOT Smoke 24 hours prior to surgery IF you wear a CPAP at night please bring your mask, tubing, and machine the morning of surgery   Remember that you must have someone to transport you home after your surgery, and remain with you for 24 hours if you are discharged the same day.   Contacts, glasses, hearing aids, dentures or bridgework may not be worn into surgery.    Leave your suitcase in the car.  After surgery it may be brought to your room.  For patients admitted to the hospital, discharge time will be determined by your treatment team.  Patients discharged the day of surgery will not be allowed to drive home.    Special instructions:   [place]- Preparing For Surgery  Before surgery, you can play an important role. Because skin is not sterile, your skin needs to be as free of germs as possible. You can reduce the number of germs on your skin by washing with CHG (chlorahexidine gluconate) Soap before surgery.  CHG is an antiseptic cleaner which kills germs and bonds with the skin to continue killing germs even after washing.    Oral Hygiene is also important to reduce your risk of infection.  Remember - BRUSH YOUR TEETH THE MORNING OF SURGERY WITH YOUR REGULAR TOOTHPASTE  Please do not use if you have an allergy to CHG or antibacterial soaps. If your skin becomes reddened/irritated stop using the CHG.  Do not shave (including legs and underarms) for at least 48 hours prior to first CHG shower. It is OK to shave your face.  Please follow these instructions carefully.   1. Shower the NIGHT BEFORE SURGERY and the MORNING OF SURGERY with CHG Soap.   2. If you chose to wash your hair, wash your hair first as usual with your normal shampoo.  3. After you shampoo, rinse your hair and body thoroughly to remove the shampoo.  4. Use CHG as you would any other liquid soap. You can apply CHG directly to the skin  and wash gently with a scrungie or a clean washcloth.   5. Apply the CHG Soap to your body ONLY FROM THE NECK DOWN.  Do not use on open wounds or open sores. Avoid contact with your eyes, ears, mouth and genitals (private parts). Wash Face and genitals (private parts)  with your normal soap.   6. Wash thoroughly, paying special attention to the area where your surgery will be performed.  7. Thoroughly rinse your body with warm water from the neck down.  8. DO NOT shower/wash with your normal soap after using and rinsing off the CHG Soap.  9. Pat yourself dry with a CLEAN TOWEL.  10. Wear CLEAN PAJAMAS to bed the night before surgery, wear comfortable clothes the morning of surgery  11. Place CLEAN SHEETS on your bed the night of  your first shower and DO NOT SLEEP WITH PETS.    Day of Surgery:  Do not apply any deodorants/lotions. Please shower the morning of surgery with the CHG soap  Please wear clean clothes to the hospital/surgery center.   Remember to brush your teeth WITH YOUR REGULAR TOOTHPASTE.   Please read over the following fact sheets that you were given.

## 2019-03-08 NOTE — Progress Notes (Signed)
PCP - Dr. Kathlene November Cardiologist - Dr. Jenne Campus Neurologist - Dr. Arlice Colt Endocrinologist - Dr. Elayne Snare  Chest x-ray - 09/19/2018 EKG - 01/23/2019 Stress Test - 03/01/2019 ECHO - 01/24/2019 Cardiac Cath - denies  Sleep Study - 03/11/2015 CPAP - patient has one at home but refuses to use it  Fasting Blood Sugar - 60-140s Checks Blood Sugar _3_ times a day  Recent A1C - 11.0 Recent ED visit for hyperglycemia  Blood Thinner Instructions: N/A Aspirin Instructions: N/A  Anesthesia review: Yes; recent ED visit for chest pain 09/19/18; uncontrolled CBGs (see above)  Patient denies shortness of breath, fever, cough and chest pain at PAT appointment  COVID test scheduled for 03/13/2019; patient is aware and states verbal understanding of quarantine guidelines.   Coronavirus Screening  Have you experienced the following symptoms:  Cough yes/no: No Fever (>100.71F)  yes/no: No Runny nose yes/no: No Sore throat yes/no: No Difficulty breathing/shortness of breath  yes/no: No  Have you or a family member traveled in the last 14 days and where? yes/no: No   If the patient indicates "YES" to the above questions, their PAT will be rescheduled to limit the exposure to others and, the surgeon will be notified. THE PATIENT WILL NEED TO BE ASYMPTOMATIC FOR 14 DAYS.   If the patient is not experiencing any of these symptoms, the PAT nurse will instruct them to NOT bring anyone with them to their appointment since they may have these symptoms or traveled as well.   Please remind your patients and families that hospital visitation restrictions are in effect and the importance of the restrictions.    Patient verbalized understanding of instructions that were given to them at the PAT appointment. Patient was also instructed that they will need to review over the PAT instructions again at home before surgery.

## 2019-03-09 ENCOUNTER — Encounter: Payer: Self-pay | Admitting: Internal Medicine

## 2019-03-09 LAB — TYPE AND SCREEN
ABO/RH(D): B POS
Antibody Screen: NEGATIVE

## 2019-03-09 LAB — HEMOGLOBIN A1C
Hgb A1c MFr Bld: 9.2 % — ABNORMAL HIGH (ref 4.8–5.6)
Mean Plasma Glucose: 217 mg/dL

## 2019-03-09 NOTE — Progress Notes (Signed)
Anesthesia Chart Review:   Case: [id number] Date/Time: 03/16/19 1150   Procedure: Lumbar 4-5 Lumbar 5 Sacral 1 Posterior lumbar interbody fusion (N/A Back) - Lumbar 4-5 Lumbar 5 Sacral 1 Posterior lumbar interbody fusion   Anesthesia type: General   Pre-op diagnosis: Spondylolisthesis, Lumbar region   Location: [facility] OR ROOM 21 / [facility]   Surgeon: Erline Levine, MD      DISCUSSION:  - Pt is a 63 year old male with hx HTN, DM, OSA, CKD (stage 3), fatty liver, cirrhosis, post-traumatic hydrocephalus (s/p shunt)  - Saw cardiologist is Jenne Campus, MD this summer for pre-op eval due to abnormal EKG.  Stress test and echo ordered; normal results below.   - HbA1c is 9.2 (was 11 12/28/18).  Per Janett Billow in Dr. Melven Sartorius office, pt's endocrinologist (Dr. [name]) has cleared pt to proceed with surgery based on improving DM control.    VS: BP 128/86   Pulse 70   Temp (!) 36.3 C   Resp 20   Ht _0  (1.803 m)   Wt 122.5 kg   SpO2 98%   BMI 37.67 kg/m   PROVIDERS: - PCP is Colon Branch, MD - Endocrinologist is Elayne Snare, MD. Last virtual visit 02/09/19 - Neurologist is Arlice Colt, MD - Nephrologist is Pearson Grippe, MD. Last office visit 01/26/19 (note in media tab) - Saw cardiologist is Jenne Campus, MD this summer for pre-op eval due to abnormal EKG.  Stress test and echo ordered; normal results below.   LABS:  - HbA1c is 9.2, glucose 144. (HbA1c was 11 on 12/28/18)  (all labs ordered are listed, but only abnormal results are displayed)  Labs Reviewed  SURGICAL PCR SCREEN - Abnormal; Notable for the following components:      Result Value   MRSA, PCR POSITIVE (*)    Staphylococcus aureus POSITIVE (*)    All other components within normal limits  GLUCOSE, CAPILLARY - Abnormal; Notable for the following components:   Glucose-Capillary 162 (*)    All other components within normal limits  HEMOGLOBIN A1C - Abnormal; Notable for the following components:   Hgb A1c MFr Bld 9.2 (*)    All other components within normal limits  COMPREHENSIVE METABOLIC PANEL - Abnormal; Notable for the following components:   Glucose, Bld 144 (*)    Creatinine, Ser 1.47 (*)    GFR calc non Af Amer 51 (*)    GFR calc Af Amer 59 (*)    All other components within normal limits  CBC - Abnormal; Notable for the following components:   Hemoglobin 11.9 (*)    HCT 38.2 (*)    RDW 16.6 (*)    Platelets 140 (*)    All other components within normal limits  PROTIME-INR  TYPE AND SCREEN     IMAGES:  Renal US 01/06/19: No cause for abnormal creatinine identified.  CXR 09/19/18: No acute cardiopulmonary disease.   EKG 01/23/19: sinus rhythm with marked sinus arrhythmia. Inferior infarct, age undetermined.    CV:  Nuclear stress test 03/01/19:   The left ventricular ejection fraction is mildly decreased (45-54%).  Nuclear stress EF: 51%.  There was no ST segment deviation noted during stress.  The study is normal.  This is a low risk study.  No ischemia.   Echo 01/24/19:  1. The left ventricle has normal systolic function with an ejection fraction of 60-65%. The cavity size was normal. There is moderately increased left ventricular wall thickness. Left ventricular diastolic parameters  were normal. 2. The right ventricle has normal systolic function. The cavity was normal. There is no increase in right ventricular wall thickness. 3. The aorta is normal in size and structure.    Past Medical History:  Diagnosis Date  . Acid reflux   . Arthritis   . Chronic headaches    on cymbalta  . Cirrhosis ([place])   . Colon polyps   . Complication of anesthesia    problems waking up from anesthesia  . Depression    on cymbalta  . Diabetes mellitus with neuropathy ([place])   . Diverticulitis 03/2013  . Eczema   . Elevated LFTs   . Fatty liver   . Hepatitis 10/2017   NASH cirrhosis  . History of kidney stones   . Hypertension   . Insomnia 04/26/2013  . Kidney stone   . Morbid  obesity ([place])   . Neuromuscular disorder ([facility])    neuropathy  . Neuropathy   . Post-traumatic hydrocephalus ([facility])    s/p shunts x 2 (first got infected )  . Psoriasis    sees Dr [name]  . Psoriatic arthritis ([place])   . 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 Sleep apnea    no CPAP     . Stomach ulcer   . Testosterone deficiency 04/28/2011    Past Surgical History:  Procedure Laterality Date  . BACK SURGERY  1980  . BRAIN SURGERY     VP shunts placed in 2007  . CHOLECYSTECTOMY N/A 08/25/2017   Procedure: LAPAROSCOPIC CHOLECYSTECTOMY WITH INTRAOPERATIVE CHOLANGIOGRAM;  Surgeon: [name], MD;  Location: [facility];  Service: General;  Laterality: N/A;  . COLONOSCOPY    . JOINT REPLACEMENT     total hip  . SHOULDER SURGERY Left 2010  . TOE SURGERY Left 2018  . TONSILLECTOMY     as a child  . TOTAL HIP ARTHROPLASTY Left 2011  . VENTRICULOPERITONEAL SHUNT  2007   x2    MEDICATIONS: . Adalimumab (HUMIRA PEN) 40 MG/0.8ML PNKT  . atorvastatin (LIPITOR) 10 MG tablet  . Blood Glucose Monitoring Suppl (FREESTYLE FREEDOM LITE) w/Device KIT  . carvedilol (COREG) 12.5 MG tablet  . DULoxetine (CYMBALTA) 60 MG capsule  . EPINEPHrine (EPIPEN 2-PAK) 0.3 mg/0.3 mL IJ SOAJ injection  . fenofibrate micronized (LOFIBRA) 134 MG capsule  . Ferrous Sulfate (IRON) 325 (65 Fe) MG TABS  . gabapentin (NEURONTIN) 600 MG tablet  . Galcanezumab-gnlm (EMGALITY) 120 MG/ML SOAJ  . glimepiride (AMARYL) 1 MG tablet  . glucose blood test strip  . hydrocortisone (ANUSOL-HC) 25 MG suppository  . Insulin Lispro Prot & Lispro (HUMALOG MIX 75/25 KWIKPEN) (75-25) 100 UNIT/ML Kwikpen  . Insulin Pen Needle 31G X 5 MM MISC  . Lancets (FREESTYLE) lancets  . levETIRAcetam (KEPPRA) 750 MG tablet  . metFORMIN (GLUCOPHAGE) 500 MG tablet  . omeprazole (PRILOSEC) 40 MG capsule  . Semaglutide (RYBELSUS) 14 MG TABS   No current facility-administered medications for this encounter.      If glucose acceptable day of surgery, I anticipate pt can proceed with surgery as scheduled.  [name], FNP-BC [facility] Short Stay Surgical Center/Anesthesiology Phone: [phone] 03/09/2019 1:25 PM

## 2019-03-09 NOTE — Anesthesia Preprocedure Evaluation (Addendum)
Anesthesia Evaluation  Patient identified by MRN, date of birth, ID band Patient awake    Reviewed: Allergy & Precautions, NPO status , Patient's Chart, lab work & pertinent test results, reviewed documented beta blocker date and time   History of Anesthesia Complications (+) PROLONGED EMERGENCE and history of anesthetic complications  Airway Mallampati: III  TM Distance: >3 FB Neck ROM: Full    Dental  (+) Dental Advisory Given, Edentulous Upper   Pulmonary sleep apnea ,    Pulmonary exam normal breath sounds clear to auscultation       Cardiovascular hypertension, Pt. on home beta blockers and Pt. on medications + CAD  Normal cardiovascular exam Rhythm:Regular Rate:Normal  Nuclear stress test 03/01/19:   The left ventricular ejection fraction is mildly decreased (45-54%).  Nuclear stress EF: 51%.  There was no ST segment deviation noted during stress.  The study is normal.  This is a low risk study.  No ischemia.   Echo 01/24/19:  1. The left ventricle has normal systolic function with an ejection fraction of 60-65%. The cavity size was normal. There is moderately increased left ventricular wall thickness. Left ventricular diastolic parameters were normal. 2. The right ventricle has normal systolic function. The cavity was normal. There is no increase in right ventricular wall thickness. 3. The aorta is normal in size and structure.   Neuro/Psych  Headaches, PSYCHIATRIC DISORDERS Depression S/p VP shunt  Neuromuscular disease    GI/Hepatic PUD, GERD  Medicated,(+) Hepatitis -  Endo/Other  diabetes, Type 2, Oral Hypoglycemic Agents, Insulin DependentObesity   Renal/GU Renal InsufficiencyRenal disease     Musculoskeletal  (+) Arthritis ,   Abdominal   Peds  Hematology  (+) Blood dyscrasia (Thrombocytopenia--Plt 140k), anemia ,   Anesthesia Other Findings Day of surgery medications reviewed with the  patient.  Reproductive/Obstetrics                           Anesthesia Physical Anesthesia Plan  ASA: III  Anesthesia Plan: General   Post-op Pain Management:    Induction: Intravenous  PONV Risk Score and Plan: 3 and Midazolam, Dexamethasone and Ondansetron  Airway Management Planned: Oral ETT  Additional Equipment:   Intra-op Plan:   Post-operative Plan: Extubation in OR  Informed Consent: I have reviewed the patients History and Physical, chart, labs and discussed the procedure including the risks, benefits and alternatives for the proposed anesthesia with the patient or authorized representative who has indicated his/her understanding and acceptance.     Dental advisory given  Plan Discussed with: CRNA  Anesthesia Plan Comments: (See APP note by Durel Salts, FNP)       Anesthesia Quick Evaluation

## 2019-03-13 ENCOUNTER — Other Ambulatory Visit (HOSPITAL_COMMUNITY)
Admission: RE | Admit: 2019-03-13 | Discharge: 2019-03-13 | Disposition: A | Discharge status: Home / Self Care | Payer: 59 | Source: Ambulatory Visit | Attending: Neurosurgery | Admitting: Neurosurgery

## 2019-03-13 DIAGNOSIS — K7460 Unspecified cirrhosis of liver: Secondary | ICD-10-CM | POA: Diagnosis not present

## 2019-03-13 DIAGNOSIS — Z882 Allergy status to sulfonamides status: Secondary | ICD-10-CM | POA: Diagnosis not present

## 2019-03-13 DIAGNOSIS — Z20828 Contact with and (suspected) exposure to other viral communicable diseases: Secondary | ICD-10-CM | POA: Diagnosis not present

## 2019-03-13 DIAGNOSIS — M48061 Spinal stenosis, lumbar region without neurogenic claudication: Secondary | ICD-10-CM | POA: Diagnosis not present

## 2019-03-13 DIAGNOSIS — Z885 Allergy status to narcotic agent status: Secondary | ICD-10-CM | POA: Diagnosis not present

## 2019-03-13 DIAGNOSIS — G911 Obstructive hydrocephalus: Secondary | ICD-10-CM | POA: Diagnosis not present

## 2019-03-13 DIAGNOSIS — M5416 Radiculopathy, lumbar region: Secondary | ICD-10-CM | POA: Diagnosis not present

## 2019-03-13 DIAGNOSIS — M4316 Spondylolisthesis, lumbar region: Secondary | ICD-10-CM | POA: Diagnosis not present

## 2019-03-14 LAB — NOVEL CORONAVIRUS, NAA (HOSP ORDER, SEND-OUT TO REF LAB; TAT 18-24 HRS): SARS-CoV-2, NAA: NOT DETECTED

## 2019-03-14 NOTE — H&P (Signed)
Patient ID:   [id number] Patient: James [name]  Date of Birth: 1955-08-13 Visit Type: Office Visit   Date: 12/02/2018 09:15 AM Provider: Marchia Meiers. Vertell Limber MD   This 63 year old male presents for Follow Up of back and hip pain.  HISTORY OF PRESENT ILLNESS:  1.  Follow Up of back and hip pain  Patient returns to review his myelogram  The patient is myelogram and post myelographic CT scan was reviewed with him and with his wife.  These demonstrate severe degenerative changes with facet arthropathy and marked spinal stenosis at the L4-5 and L5-S1 levels.  The patient continues to have significant back and bilateral lower extremity pain.  He has spoken with Dr. Louanne Skye who recommended to the patient that he follow-up with me for further care because of his shunt and obstructive hydrocephalus.  He felt that I would be able to address all of his neurosurgical issues at the same time.  At this point his shunt is working fine and I reviewed his most recent head CT which does not show ventriculomegaly.  The patient wants to go ahead with surgery.  I had a lengthy talk with the patient and his wife about his cirrhosis of the liver and he says that Dr. Henrene Pastor has cleared him for proceeding with surgery.  We also talked about the need for weight loss and the fatty liver changes were redemonstrated on his recent imaging.  I have asked the patient to get clearance from his gastroenterologist before proceeding with surgery and explained the risks of anesthesia and how they can affect his liver function and cause further liver damage.  Despite these concerns they wish to proceed with surgery.  We talked about injections and other nonsurgical options but the patient says he wants to get it fixed and not take temporizing or half measures.         Medical/Surgical/Interim History Reviewed, no change.  Last detailed document date:10/05/2018.     PAST MEDICAL HISTORY, SURGICAL HISTORY, FAMILY HISTORY,  SOCIAL HISTORY AND REVIEW OF SYSTEMS I have reviewed the patient's past medical, surgical, family and social history as well as the comprehensive review of systems as included on the [facility] history form dated 09/28/2018, which I have signed.  Family History:  Reviewed, no changes.  Last detailed document date:10/05/2018.   Social History: Reviewed, no changes. Last detailed document date: 10/05/2018.    MEDICATIONS: (added, continued or stopped this visit) Started Medication Directions Instruction Stopped   Aleve 220 mg tablet take 1 tablet by oral route  every 12 hours as needed     atorvastatin 10 mg tablet take 1 tablet by oral route  every day     carvedilol 12.5 mg tablet take 1 tablet by oral route 2 times every day with food     clonazepam take 0.3 mg/ tablet by oral route  every day at bedtime     Cymbalta 60 mg capsule,delayed release take 2 capsule by oral route  every day     fenofibrate micronized 134 mg capsule take 1 capsule by oral route  every day with food     ferrous sulfate 325 mg (65 mg iron) tablet take 1 tablet by oral route  every day     gabapentin 600 mg tablet take 1 tablet by oral route 3 times every day     glimepiride 1 mg tablet take 1 tablet by oral route  every day     Humira 40  mg/0.8 mL subcutaneous syringe kit inject 1 milliliter by subcutaneous route  every week in the abdomen or thigh (rotate sites)     Keppra 750 mg tablet take 2 tablet by oral route 2 times every day     metformin 1,000 mg tablet take 1 tablet by oral route 2 times every day with morning and evening meals     omeprazole 40 mg capsule,delayed release take 1 capsule by oral route  up to 2 time  before a meal     Trulicity 1.5 BP/7.9 mL subcutaneous pen injector inject 0.5 milliliter by subcutaneous route  every week in the abdomen, thigh, or upper arm rotating injection sites     Tylenol      Valtrex 1 gram tablet take 1 tablet by oral route 3 time daily        ALLERGIES: Ingredient Reaction Medication Name Comment  MORPHINE HCL     HYDROCODONE     SULFA (SULFONAMIDE ANTIBIOTICS)         PHYSICAL EXAM:   Vitals Date Temp F BP Pulse Ht In Wt Lb BMI BSA Pain Score  12/02/2018 97.1 128/76 78 71 270 37.66  6/10      IMPRESSION:   Severe lumbar spinal stenosis with severe degenerative changes L4-5 and L5-S1 levels in the face of morbid obesity and cirrhosis of the liver.  I explained to the patient that he is at high risk for surgical complication but he says he is unable to function and wants to go ahead with surgery.  PLAN:  Proceed with GI clearance and then L4-5 and L5-S1 decompression and fusion surgery.  Risks and benefits were discussed in detail with the patient and he was given a prescription for lumbosacral orthosis.  Orders: Diagnostic Procedures: Assessment Procedure  M54.16 Lumbar Spine- AP/Lat  Instruction(s)/Education: Assessment Instruction  I10 Hypertension education  Z68.37 Lifestyle education regarding diet  Miscellaneous: Assessment   M43.16 LSO Brace   Completed Orders (this encounter) Order Details Reason Side Interpretation Result Initial Treatment Date Region  Hypertension education Patient to follow up with primary care provider        Lifestyle education regarding diet Patient encouraged to eat a well balance diet         Assessment/Plan   # Detail Type Description   1. Assessment Epidural lipomatosis (D17.79).       2. Assessment Low back pain, unspecified back pain laterality, with sciatica presence unspecified (M54.5).       3. Assessment Obstructive hydrocephalus (G91.1).       4. Assessment NASH (nonalcoholic steatohepatitis) (K75.81).       5. Assessment Spondylolisthesis, lumbar region (M43.16).   Plan Orders LSO Brace. Clinical information/comments: gave written script to patient.       6. Assessment Radiculopathy, lumbar region (M54.16).       7. Assessment Essential (primary)  hypertension (I10).       8. Assessment Body mass index (BMI) 37.0-37.9, adult (Z68.37).   Plan Orders Today's instructions / counseling include(s) Lifestyle education regarding diet. Clinical information/comments: Patient encouraged to eat a well balance diet.         Pain Management Plan Pain Scale: 6/10. Method: Numeric Pain Intensity Scale. Location: back and R hip. Onset: 08/06/2018. Duration: varies. Quality: discomforting. Pain management follow-up plan of care: Patient taking medication as prescribed.  Fall Risk Plan The patient has not fallen in the last year.              Provider:  Marchia Meiers. Vertell Limber MD  12/03/2018 09:44 AM    Dictation edited by: Marchia Meiers. Vertell Limber    CC Providers: Basil Dess  [facility] [address], [place] [id number]-               Electronically signed by Marchia Meiers Vertell Limber MD on 12/03/2018 09:44 AM

## 2019-03-15 MED ORDER — VANCOMYCIN HCL 10 G IV SOLR
1500.0000 mg | INTRAVENOUS | Status: AC
  Administered 2019-03-16: 1500 mg via INTRAVENOUS
  Filled 2019-03-15: qty 1500

## 2019-03-15 MED ORDER — DEXTROSE 5 % IV SOLN
3.0000 g | INTRAVENOUS | Status: AC
  Administered 2019-03-16: 3 g via INTRAVENOUS
  Filled 2019-03-15: qty 3

## 2019-03-16 ENCOUNTER — Inpatient Hospital Stay (HOSPITAL_COMMUNITY): Payer: 59 | Admitting: Anesthesiology

## 2019-03-16 ENCOUNTER — Other Ambulatory Visit: Payer: Self-pay

## 2019-03-16 ENCOUNTER — Inpatient Hospital Stay (HOSPITAL_COMMUNITY): Payer: 59 | Admitting: Emergency Medicine

## 2019-03-16 ENCOUNTER — Encounter (HOSPITAL_COMMUNITY)
Admission: RE | Disposition: A | Discharge status: Home / Self Care | Payer: Self-pay | Source: Home / Self Care | Attending: Neurosurgery

## 2019-03-16 ENCOUNTER — Inpatient Hospital Stay (HOSPITAL_COMMUNITY): Payer: 59

## 2019-03-16 ENCOUNTER — Encounter (HOSPITAL_COMMUNITY): Payer: Self-pay

## 2019-03-16 ENCOUNTER — Inpatient Hospital Stay (HOSPITAL_COMMUNITY)
Admission: RE | Admit: 2019-03-16 | Discharge: 2019-03-17 | DRG: 454 | Disposition: A | Discharge status: Home / Self Care | Payer: 59 | Attending: Neurosurgery | Admitting: Neurosurgery

## 2019-03-16 DIAGNOSIS — I10 Essential (primary) hypertension: Secondary | ICD-10-CM | POA: Diagnosis present

## 2019-03-16 DIAGNOSIS — E882 Lipomatosis, not elsewhere classified: Secondary | ICD-10-CM | POA: Diagnosis not present

## 2019-03-16 DIAGNOSIS — M5416 Radiculopathy, lumbar region: Secondary | ICD-10-CM | POA: Diagnosis present

## 2019-03-16 DIAGNOSIS — M4317 Spondylolisthesis, lumbosacral region: Secondary | ICD-10-CM | POA: Diagnosis not present

## 2019-03-16 DIAGNOSIS — Z20828 Contact with and (suspected) exposure to other viral communicable diseases: Secondary | ICD-10-CM | POA: Diagnosis present

## 2019-03-16 DIAGNOSIS — Z6837 Body mass index (BMI) 37.0-37.9, adult: Secondary | ICD-10-CM | POA: Diagnosis not present

## 2019-03-16 DIAGNOSIS — Z981 Arthrodesis status: Secondary | ICD-10-CM | POA: Diagnosis not present

## 2019-03-16 DIAGNOSIS — M4327 Fusion of spine, lumbosacral region: Secondary | ICD-10-CM | POA: Diagnosis not present

## 2019-03-16 DIAGNOSIS — Z882 Allergy status to sulfonamides status: Secondary | ICD-10-CM

## 2019-03-16 DIAGNOSIS — M48061 Spinal stenosis, lumbar region without neurogenic claudication: Secondary | ICD-10-CM | POA: Diagnosis present

## 2019-03-16 DIAGNOSIS — G911 Obstructive hydrocephalus: Secondary | ICD-10-CM | POA: Diagnosis present

## 2019-03-16 DIAGNOSIS — M4316 Spondylolisthesis, lumbar region: Principal | ICD-10-CM | POA: Diagnosis present

## 2019-03-16 DIAGNOSIS — K7581 Nonalcoholic steatohepatitis (NASH): Secondary | ICD-10-CM | POA: Diagnosis present

## 2019-03-16 DIAGNOSIS — Z885 Allergy status to narcotic agent status: Secondary | ICD-10-CM

## 2019-03-16 DIAGNOSIS — K7460 Unspecified cirrhosis of liver: Secondary | ICD-10-CM | POA: Diagnosis present

## 2019-03-16 DIAGNOSIS — Z79899 Other long term (current) drug therapy: Secondary | ICD-10-CM | POA: Diagnosis not present

## 2019-03-16 DIAGNOSIS — Z419 Encounter for procedure for purposes other than remedying health state, unspecified: Secondary | ICD-10-CM

## 2019-03-16 HISTORY — PX: LUMBAR FUSION: SHX111

## 2019-03-16 HISTORY — DX: Spondylolisthesis, lumbar region: M43.16

## 2019-03-16 LAB — GLUCOSE, CAPILLARY
Glucose-Capillary: 174 mg/dL — ABNORMAL HIGH (ref 70–99)
Glucose-Capillary: 85 mg/dL (ref 70–99)
Glucose-Capillary: 193 mg/dL — ABNORMAL HIGH (ref 70–99)
Glucose-Capillary: 193 mg/dL — ABNORMAL HIGH (ref 70–99)

## 2019-03-16 SURGERY — POSTERIOR LUMBAR FUSION 2 LEVEL
Anesthesia: General | Site: Back

## 2019-03-16 MED ORDER — KCL IN DEXTROSE-NACL 20-5-0.45 MEQ/L-%-% IV SOLN: INTRAVENOUS | Status: DC

## 2019-03-16 MED ORDER — HYDROMORPHONE HCL 1 MG/ML IJ SOLN
1.0000 mg | INTRAMUSCULAR | Status: DC | PRN
  Administered 2019-03-16: 1 mg via INTRAVENOUS
  Filled 2019-03-16: qty 1

## 2019-03-16 MED ORDER — 0.9 % SODIUM CHLORIDE (POUR BTL) OPTIME
TOPICAL | Status: DC | PRN
  Administered 2019-03-16 (×2): 1000 mL

## 2019-03-16 MED ORDER — ONDANSETRON HCL 4 MG/2ML IJ SOLN
INTRAMUSCULAR | Status: DC | PRN
  Administered 2019-03-16: 4 mg via INTRAVENOUS

## 2019-03-16 MED ORDER — PHENYLEPHRINE 40 MCG/ML (10ML) SYRINGE FOR IV PUSH (FOR BLOOD PRESSURE SUPPORT)
PREFILLED_SYRINGE | INTRAVENOUS | Status: DC | PRN
  Administered 2019-03-16 (×2): 80 ug via INTRAVENOUS

## 2019-03-16 MED ORDER — DEXAMETHASONE SODIUM PHOSPHATE 10 MG/ML IJ SOLN
INTRAMUSCULAR | Status: AC
  Filled 2019-03-16: qty 1

## 2019-03-16 MED ORDER — ROCURONIUM BROMIDE 10 MG/ML (PF) SYRINGE
PREFILLED_SYRINGE | INTRAVENOUS | Status: AC
  Filled 2019-03-16: qty 10

## 2019-03-16 MED ORDER — LIDOCAINE 2% (20 MG/ML) 5 ML SYRINGE
INTRAMUSCULAR | Status: AC
  Filled 2019-03-16: qty 5

## 2019-03-16 MED ORDER — METHOCARBAMOL 500 MG PO TABS
500.0000 mg | ORAL_TABLET | Freq: Four times a day (QID) | ORAL | Status: DC | PRN
  Administered 2019-03-16 – 2019-03-17 (×3): 500 mg via ORAL
  Filled 2019-03-16 (×2): qty 1

## 2019-03-16 MED ORDER — CARVEDILOL 12.5 MG PO TABS
12.5000 mg | ORAL_TABLET | Freq: Two times a day (BID) | ORAL | Status: DC
  Administered 2019-03-16 – 2019-03-17 (×2): 12.5 mg via ORAL
  Filled 2019-03-16 (×2): qty 1

## 2019-03-16 MED ORDER — ROCURONIUM BROMIDE 10 MG/ML (PF) SYRINGE
PREFILLED_SYRINGE | INTRAVENOUS | Status: DC | PRN
  Administered 2019-03-16 (×2): 30 mg via INTRAVENOUS
  Administered 2019-03-16: 50 mg via INTRAVENOUS

## 2019-03-16 MED ORDER — MIDAZOLAM HCL 5 MG/5ML IJ SOLN
INTRAMUSCULAR | Status: DC | PRN
  Administered 2019-03-16: 2 mg via INTRAVENOUS

## 2019-03-16 MED ORDER — HYDROCODONE-ACETAMINOPHEN 5-325 MG PO TABS
2.0000 | ORAL_TABLET | ORAL | Status: DC | PRN
  Administered 2019-03-16 – 2019-03-17 (×4): 2 via ORAL
  Filled 2019-03-16 (×4): qty 2

## 2019-03-16 MED ORDER — CHLORHEXIDINE GLUCONATE CLOTH 2 % EX PADS: 6.0000 | MEDICATED_PAD | Freq: Once | CUTANEOUS | Status: DC

## 2019-03-16 MED ORDER — SODIUM CHLORIDE 0.9% FLUSH: 3.0000 mL | INTRAVENOUS | Status: DC | PRN

## 2019-03-16 MED ORDER — VANCOMYCIN HCL 1000 MG IV SOLR
INTRAVENOUS | Status: AC
  Filled 2019-03-16: qty 1000

## 2019-03-16 MED ORDER — FERROUS SULFATE 325 (65 FE) MG PO TABS
325.0000 mg | ORAL_TABLET | Freq: Two times a day (BID) | ORAL | Status: DC
  Administered 2019-03-17: 325 mg via ORAL
  Filled 2019-03-16: qty 1

## 2019-03-16 MED ORDER — EPHEDRINE SULFATE 50 MG/ML IJ SOLN
INTRAMUSCULAR | Status: DC | PRN
  Administered 2019-03-16 (×2): 5 mg via INTRAVENOUS
  Administered 2019-03-16: 10 mg via INTRAVENOUS

## 2019-03-16 MED ORDER — ALBUMIN HUMAN 5 % IV SOLN
INTRAVENOUS | Status: DC | PRN
  Administered 2019-03-16 (×2): via INTRAVENOUS

## 2019-03-16 MED ORDER — BUPIVACAINE HCL (PF) 0.5 % IJ SOLN
INTRAMUSCULAR | Status: AC
  Filled 2019-03-16: qty 30

## 2019-03-16 MED ORDER — INSULIN ASPART PROT & ASPART (70-30 MIX) 100 UNIT/ML ~~LOC~~ SUSP
60.0000 [IU] | Freq: Every day | SUBCUTANEOUS | Status: DC
  Filled 2019-03-16: qty 10

## 2019-03-16 MED ORDER — INSULIN ASPART PROT & ASPART (70-30 MIX) 100 UNIT/ML ~~LOC~~ SUSP
35.0000 [IU] | Freq: Every day | SUBCUTANEOUS | Status: DC
  Filled 2019-03-16: qty 10

## 2019-03-16 MED ORDER — THROMBIN 5000 UNITS EX SOLR
OROMUCOSAL | Status: DC | PRN
  Administered 2019-03-16: 5 mL via TOPICAL

## 2019-03-16 MED ORDER — ACETAMINOPHEN 650 MG RE SUPP: 650.0000 mg | RECTAL | Status: DC | PRN

## 2019-03-16 MED ORDER — CEFAZOLIN SODIUM-DEXTROSE 2-4 GM/100ML-% IV SOLN
2.0000 g | Freq: Three times a day (TID) | INTRAVENOUS | Status: AC
  Administered 2019-03-16 – 2019-03-17 (×2): 2 g via INTRAVENOUS
  Filled 2019-03-16 (×2): qty 100

## 2019-03-16 MED ORDER — OXYCODONE HCL 5 MG PO TABS
ORAL_TABLET | ORAL | Status: AC
  Filled 2019-03-16: qty 1

## 2019-03-16 MED ORDER — LIDOCAINE-EPINEPHRINE 1 %-1:100000 IJ SOLN
INTRAMUSCULAR | Status: DC | PRN
  Administered 2019-03-16: 5 mL

## 2019-03-16 MED ORDER — PROPOFOL 10 MG/ML IV BOLUS
INTRAVENOUS | Status: AC
  Filled 2019-03-16: qty 20

## 2019-03-16 MED ORDER — SODIUM CHLORIDE 0.9 % IV SOLN
INTRAVENOUS | Status: DC | PRN
  Administered 2019-03-16: 60 ug/min via INTRAVENOUS

## 2019-03-16 MED ORDER — HYDROCORTISONE ACETATE 25 MG RE SUPP
25.0000 mg | Freq: Two times a day (BID) | RECTAL | Status: DC | PRN
  Filled 2019-03-16: qty 1

## 2019-03-16 MED ORDER — FENTANYL CITRATE (PF) 100 MCG/2ML IJ SOLN
INTRAMUSCULAR | Status: AC
  Administered 2019-03-16: 25 ug via INTRAVENOUS
  Filled 2019-03-16: qty 2

## 2019-03-16 MED ORDER — PROPOFOL 10 MG/ML IV BOLUS
INTRAVENOUS | Status: DC | PRN
  Administered 2019-03-16: 200 mg via INTRAVENOUS

## 2019-03-16 MED ORDER — SUCCINYLCHOLINE CHLORIDE 200 MG/10ML IV SOSY
PREFILLED_SYRINGE | INTRAVENOUS | Status: AC
  Filled 2019-03-16: qty 10

## 2019-03-16 MED ORDER — POLYETHYLENE GLYCOL 3350 17 G PO PACK: 17.0000 g | PACK | Freq: Every day | ORAL | Status: DC | PRN

## 2019-03-16 MED ORDER — THROMBIN 20000 UNITS EX SOLR
CUTANEOUS | Status: DC | PRN
  Administered 2019-03-16: 20 mL via TOPICAL

## 2019-03-16 MED ORDER — ZOLPIDEM TARTRATE 5 MG PO TABS: 5.0000 mg | ORAL_TABLET | Freq: Every evening | ORAL | Status: DC | PRN

## 2019-03-16 MED ORDER — INSULIN ASPART PROT & ASPART (70-30 MIX) 100 UNIT/ML ~~LOC~~ SUSP
40.0000 [IU] | Freq: Every day | SUBCUTANEOUS | Status: DC
  Filled 2019-03-16: qty 10

## 2019-03-16 MED ORDER — PANTOPRAZOLE SODIUM 40 MG PO TBEC: 40.0000 mg | DELAYED_RELEASE_TABLET | Freq: Every day | ORAL | Status: DC

## 2019-03-16 MED ORDER — SODIUM CHLORIDE 0.9% FLUSH
3.0000 mL | Freq: Two times a day (BID) | INTRAVENOUS | Status: DC
  Administered 2019-03-16: 3 mL via INTRAVENOUS

## 2019-03-16 MED ORDER — LEVETIRACETAM 250 MG PO TABS
750.0000 mg | ORAL_TABLET | Freq: Two times a day (BID) | ORAL | Status: DC
  Administered 2019-03-16 – 2019-03-17 (×2): 750 mg via ORAL
  Filled 2019-03-16 (×2): qty 3

## 2019-03-16 MED ORDER — METHOCARBAMOL 1000 MG/10ML IJ SOLN
500.0000 mg | Freq: Four times a day (QID) | INTRAVENOUS | Status: DC | PRN
  Filled 2019-03-16: qty 5

## 2019-03-16 MED ORDER — ATORVASTATIN CALCIUM 10 MG PO TABS
10.0000 mg | ORAL_TABLET | Freq: Every day | ORAL | Status: DC
  Administered 2019-03-16 – 2019-03-17 (×2): 10 mg via ORAL
  Filled 2019-03-16 (×2): qty 1

## 2019-03-16 MED ORDER — PHENYLEPHRINE HCL (PRESSORS) 10 MG/ML IV SOLN
INTRAVENOUS | Status: AC
  Filled 2019-03-16: qty 1

## 2019-03-16 MED ORDER — PHENOL 1.4 % MT LIQD: 1.0000 | OROMUCOSAL | Status: DC | PRN

## 2019-03-16 MED ORDER — LACTATED RINGERS IV SOLN
INTRAVENOUS | Status: DC | PRN
  Administered 2019-03-16: 11:00:00 via INTRAVENOUS

## 2019-03-16 MED ORDER — OXYCODONE HCL 5 MG PO TABS
5.0000 mg | ORAL_TABLET | ORAL | Status: DC | PRN
  Administered 2019-03-16: 5 mg via ORAL

## 2019-03-16 MED ORDER — ACETAMINOPHEN 500 MG PO TABS
1000.0000 mg | ORAL_TABLET | Freq: Once | ORAL | Status: AC
  Administered 2019-03-16: 1000 mg via ORAL
  Filled 2019-03-16: qty 2

## 2019-03-16 MED ORDER — BUPIVACAINE LIPOSOME 1.3 % IJ SUSP
20.0000 mL | INTRAMUSCULAR | Status: AC
  Administered 2019-03-16: 20 mL
  Filled 2019-03-16: qty 20

## 2019-03-16 MED ORDER — METFORMIN HCL 500 MG PO TABS
500.0000 mg | ORAL_TABLET | Freq: Every day | ORAL | Status: DC
  Administered 2019-03-16: 500 mg via ORAL
  Filled 2019-03-16: qty 1

## 2019-03-16 MED ORDER — BUPIVACAINE HCL (PF) 0.5 % IJ SOLN
INTRAMUSCULAR | Status: DC | PRN
  Administered 2019-03-16: 5 mL

## 2019-03-16 MED ORDER — MENTHOL 3 MG MT LOZG: 1.0000 | LOZENGE | OROMUCOSAL | Status: DC | PRN

## 2019-03-16 MED ORDER — FENTANYL CITRATE (PF) 100 MCG/2ML IJ SOLN
25.0000 ug | INTRAMUSCULAR | Status: DC | PRN
  Administered 2019-03-16 (×2): 25 ug via INTRAVENOUS

## 2019-03-16 MED ORDER — EPINEPHRINE 0.3 MG/0.3ML IJ SOAJ: 0.3000 mg | Freq: Once | INTRAMUSCULAR | Status: DC | PRN

## 2019-03-16 MED ORDER — VANCOMYCIN HCL 1000 MG IV SOLR
INTRAVENOUS | Status: DC | PRN
  Administered 2019-03-16: 1000 mg via TOPICAL

## 2019-03-16 MED ORDER — GALCANEZUMAB-GNLM 120 MG/ML ~~LOC~~ SOAJ: 1.0000 "pen " | SUBCUTANEOUS | Status: DC

## 2019-03-16 MED ORDER — GLIMEPIRIDE 2 MG PO TABS: 1.0000 mg | ORAL_TABLET | Freq: Every day | ORAL | Status: DC

## 2019-03-16 MED ORDER — ONDANSETRON HCL 4 MG PO TABS: 4.0000 mg | ORAL_TABLET | Freq: Four times a day (QID) | ORAL | Status: DC | PRN

## 2019-03-16 MED ORDER — GABAPENTIN 600 MG PO TABS
600.0000 mg | ORAL_TABLET | Freq: Two times a day (BID) | ORAL | Status: DC
  Administered 2019-03-16 – 2019-03-17 (×2): 600 mg via ORAL
  Filled 2019-03-16 (×2): qty 1

## 2019-03-16 MED ORDER — SODIUM CHLORIDE 0.9 % IV SOLN: 250.0000 mL | INTRAVENOUS | Status: DC

## 2019-03-16 MED ORDER — THROMBIN 5000 UNITS EX SOLR
CUTANEOUS | Status: AC
  Filled 2019-03-16: qty 5000

## 2019-03-16 MED ORDER — FENTANYL CITRATE (PF) 250 MCG/5ML IJ SOLN
INTRAMUSCULAR | Status: DC | PRN
  Administered 2019-03-16: 50 ug via INTRAVENOUS
  Administered 2019-03-16: 100 ug via INTRAVENOUS

## 2019-03-16 MED ORDER — ACETAMINOPHEN 325 MG PO TABS
650.0000 mg | ORAL_TABLET | ORAL | Status: DC | PRN
  Filled 2019-03-16: qty 2

## 2019-03-16 MED ORDER — LIDOCAINE 2% (20 MG/ML) 5 ML SYRINGE
INTRAMUSCULAR | Status: DC | PRN
  Administered 2019-03-16: 100 mg via INTRAVENOUS

## 2019-03-16 MED ORDER — INSULIN LISPRO PROT & LISPRO (75-25 MIX) 100 UNIT/ML KWIKPEN: 35.0000 [IU] | PEN_INJECTOR | SUBCUTANEOUS | Status: DC

## 2019-03-16 MED ORDER — BISACODYL 10 MG RE SUPP: 10.0000 mg | Freq: Every day | RECTAL | Status: DC | PRN

## 2019-03-16 MED ORDER — ONDANSETRON HCL 4 MG/2ML IJ SOLN: 4.0000 mg | Freq: Four times a day (QID) | INTRAMUSCULAR | Status: DC | PRN

## 2019-03-16 MED ORDER — ONDANSETRON HCL 4 MG/2ML IJ SOLN
INTRAMUSCULAR | Status: AC
  Filled 2019-03-16: qty 4

## 2019-03-16 MED ORDER — LACTATED RINGERS IV SOLN
INTRAVENOUS | Status: DC
  Administered 2019-03-16 (×2): via INTRAVENOUS

## 2019-03-16 MED ORDER — DULOXETINE HCL 30 MG PO CPEP
120.0000 mg | ORAL_CAPSULE | Freq: Every day | ORAL | Status: DC
  Administered 2019-03-16: 120 mg via ORAL
  Filled 2019-03-16: qty 4

## 2019-03-16 MED ORDER — SEMAGLUTIDE 14 MG PO TABS: 14.0000 mg | ORAL_TABLET | Freq: Every day | ORAL | Status: DC

## 2019-03-16 MED ORDER — FENTANYL CITRATE (PF) 250 MCG/5ML IJ SOLN
INTRAMUSCULAR | Status: AC
  Filled 2019-03-16: qty 5

## 2019-03-16 MED ORDER — LIDOCAINE-EPINEPHRINE 1 %-1:100000 IJ SOLN
INTRAMUSCULAR | Status: AC
  Filled 2019-03-16: qty 1

## 2019-03-16 MED ORDER — MIDAZOLAM HCL 2 MG/2ML IJ SOLN
INTRAMUSCULAR | Status: AC
  Filled 2019-03-16: qty 2

## 2019-03-16 MED ORDER — FENOFIBRATE 54 MG PO TABS
54.0000 mg | ORAL_TABLET | Freq: Every day | ORAL | Status: DC
  Filled 2019-03-16: qty 1

## 2019-03-16 MED ORDER — THROMBIN 20000 UNITS EX SOLR
CUTANEOUS | Status: AC
  Filled 2019-03-16: qty 20000

## 2019-03-16 MED ORDER — METHOCARBAMOL 500 MG PO TABS
ORAL_TABLET | ORAL | Status: AC
  Filled 2019-03-16: qty 1

## 2019-03-16 MED ORDER — FLEET ENEMA 7-19 GM/118ML RE ENEM: 1.0000 | ENEMA | Freq: Once | RECTAL | Status: DC | PRN

## 2019-03-16 MED ORDER — PROMETHAZINE HCL 25 MG/ML IJ SOLN: 6.2500 mg | INTRAMUSCULAR | Status: DC | PRN

## 2019-03-16 MED ORDER — EPHEDRINE 5 MG/ML INJ
INTRAVENOUS | Status: AC
  Filled 2019-03-16: qty 10

## 2019-03-16 MED ORDER — ALUM & MAG HYDROXIDE-SIMETH 200-200-20 MG/5ML PO SUSP: 30.0000 mL | Freq: Four times a day (QID) | ORAL | Status: DC | PRN

## 2019-03-16 MED ORDER — DOCUSATE SODIUM 100 MG PO CAPS
100.0000 mg | ORAL_CAPSULE | Freq: Two times a day (BID) | ORAL | Status: DC
  Administered 2019-03-16 – 2019-03-17 (×2): 100 mg via ORAL
  Filled 2019-03-16 (×2): qty 1

## 2019-03-16 MED ORDER — PANTOPRAZOLE SODIUM 40 MG IV SOLR
40.0000 mg | Freq: Every day | INTRAVENOUS | Status: DC
  Administered 2019-03-16: 40 mg via INTRAVENOUS
  Filled 2019-03-16: qty 40

## 2019-03-16 MED ORDER — SUGAMMADEX SODIUM 200 MG/2ML IV SOLN
INTRAVENOUS | Status: DC | PRN
  Administered 2019-03-16: 100 mg via INTRAVENOUS
  Administered 2019-03-16: 200 mg via INTRAVENOUS

## 2019-03-16 MED ORDER — VANCOMYCIN HCL 1000 MG IV SOLR
INTRAVENOUS | Status: DC | PRN
  Administered 2019-03-16: 1500 mg via INTRAVENOUS

## 2019-03-16 MED ORDER — PHENYLEPHRINE 40 MCG/ML (10ML) SYRINGE FOR IV PUSH (FOR BLOOD PRESSURE SUPPORT)
PREFILLED_SYRINGE | INTRAVENOUS | Status: AC
  Filled 2019-03-16: qty 10

## 2019-03-16 SURGICAL SUPPLY — 83 items
BASKET BONE COLLECTION (BASKET) ×3 IMPLANT
BLADE CLIPPER SURG (BLADE) ×2 IMPLANT
BUR MATCHSTICK NEURO 3.0 LAGG (BURR) ×3 IMPLANT
BUR PRECISION FLUTE 5.0 (BURR) ×3 IMPLANT
CAGE COROENT 12X9X23-8 (Cage) ×4 IMPLANT
CAGE COROENT LG 12X9X23-12 (Cage) ×4 IMPLANT
CANISTER SUCT 3000ML PPV (MISCELLANEOUS) ×3 IMPLANT
CARTRIDGE OIL MAESTRO DRILL (MISCELLANEOUS) ×1 IMPLANT
CONT SPEC 4OZ CLIKSEAL STRL BL (MISCELLANEOUS) ×3 IMPLANT
COVER BACK TABLE 60X90IN (DRAPES) ×3 IMPLANT
COVER WAND RF STERILE (DRAPES) ×1 IMPLANT
DECANTER SPIKE VIAL GLASS SM (MISCELLANEOUS) ×3 IMPLANT
DERMABOND ADVANCED (GAUZE/BANDAGES/DRESSINGS) ×2
DERMABOND ADVANCED .7 DNX12 (GAUZE/BANDAGES/DRESSINGS) ×1 IMPLANT
DIFFUSER DRILL AIR PNEUMATIC (MISCELLANEOUS) ×3 IMPLANT
DRAPE C-ARM 42X72 X-RAY (DRAPES) ×3 IMPLANT
DRAPE C-ARMOR (DRAPES) ×3 IMPLANT
DRAPE LAPAROTOMY 100X72X124 (DRAPES) ×3 IMPLANT
DRAPE SURG 17X23 STRL (DRAPES) ×3 IMPLANT
DRSG OPSITE POSTOP 4X8 (GAUZE/BANDAGES/DRESSINGS) ×2 IMPLANT
DURAPREP 26ML APPLICATOR (WOUND CARE) ×3 IMPLANT
ELECT BLADE 4.0 EZ CLEAN MEGAD (MISCELLANEOUS) ×3
ELECT REM PT RETURN 9FT ADLT (ELECTROSURGICAL) ×3
ELECTRODE BLDE 4.0 EZ CLN MEGD (MISCELLANEOUS) IMPLANT
ELECTRODE REM PT RTRN 9FT ADLT (ELECTROSURGICAL) ×1 IMPLANT
EVACUATOR 1/8 PVC DRAIN (DRAIN) ×2 IMPLANT
GAUZE 4X4 16PLY RFD (DISPOSABLE) ×2 IMPLANT
GAUZE SPONGE 4X4 12PLY STRL (GAUZE/BANDAGES/DRESSINGS) ×1 IMPLANT
GLOVE BIO SURGEON STRL SZ8 (GLOVE) ×8 IMPLANT
GLOVE BIO SURGEON STRL SZ8.5 (GLOVE) ×2 IMPLANT
GLOVE BIOGEL PI IND STRL 6.5 (GLOVE) IMPLANT
GLOVE BIOGEL PI IND STRL 7.5 (GLOVE) IMPLANT
GLOVE BIOGEL PI IND STRL 8 (GLOVE) ×2 IMPLANT
GLOVE BIOGEL PI IND STRL 8.5 (GLOVE) ×2 IMPLANT
GLOVE BIOGEL PI INDICATOR 6.5 (GLOVE) ×2
GLOVE BIOGEL PI INDICATOR 7.5 (GLOVE) ×2
GLOVE BIOGEL PI INDICATOR 8 (GLOVE) ×12
GLOVE BIOGEL PI INDICATOR 8.5 (GLOVE) ×4
GLOVE ECLIPSE 8.0 STRL XLNG CF (GLOVE) ×6 IMPLANT
GLOVE EXAM NITRILE XL STR (GLOVE) IMPLANT
GLOVE SURG SS PI 6.5 STRL IVOR (GLOVE) ×4 IMPLANT
GLOVE SURG SS PI 7.0 STRL IVOR (GLOVE) ×2 IMPLANT
GOWN STRL REUS W/ TWL LRG LVL3 (GOWN DISPOSABLE) IMPLANT
GOWN STRL REUS W/ TWL XL LVL3 (GOWN DISPOSABLE) ×3 IMPLANT
GOWN STRL REUS W/TWL 2XL LVL3 (GOWN DISPOSABLE) ×8 IMPLANT
GOWN STRL REUS W/TWL LRG LVL3 (GOWN DISPOSABLE) ×2
GOWN STRL REUS W/TWL XL LVL3 (GOWN DISPOSABLE) ×6
HEMOSTAT POWDER KIT SURGIFOAM (HEMOSTASIS) ×3 IMPLANT
KIT BASIN OR (CUSTOM PROCEDURE TRAY) ×3 IMPLANT
KIT INFUSE X SMALL 1.4CC (Orthopedic Implant) ×2 IMPLANT
KIT POSITION SURG JACKSON T1 (MISCELLANEOUS) ×3 IMPLANT
KIT TURNOVER KIT B (KITS) ×3 IMPLANT
MILL MEDIUM DISP (BLADE) ×2 IMPLANT
NDL HYPO 21X1.5 SAFETY (NEEDLE) IMPLANT
NDL HYPO 25X1 1.5 SAFETY (NEEDLE) ×1 IMPLANT
NDL SPNL 18GX3.5 QUINCKE PK (NEEDLE) IMPLANT
NEEDLE HYPO 21X1.5 SAFETY (NEEDLE) ×3 IMPLANT
NEEDLE HYPO 25X1 1.5 SAFETY (NEEDLE) ×3 IMPLANT
NEEDLE SPNL 18GX3.5 QUINCKE PK (NEEDLE) ×3 IMPLANT
NS IRRIG 1000ML POUR BTL (IV SOLUTION) ×5 IMPLANT
OIL CARTRIDGE MAESTRO DRILL (MISCELLANEOUS) ×3
PACK LAMINECTOMY NEURO (CUSTOM PROCEDURE TRAY) ×3 IMPLANT
PAD ARMBOARD 7.5X6 YLW CONV (MISCELLANEOUS) ×9 IMPLANT
PATTIES SURGICAL .5 X.5 (GAUZE/BANDAGES/DRESSINGS) IMPLANT
PATTIES SURGICAL .5 X1 (DISPOSABLE) IMPLANT
PATTIES SURGICAL 1X1 (DISPOSABLE) IMPLANT
ROD RELINE TI LORD 5.5X70 (Rod) ×4 IMPLANT
SCREW LOCK RELINE 5.5 TULIP (Screw) ×12 IMPLANT
SCREW RELINE-O POLY 7.5X50 (Screw) ×12 IMPLANT
SCREW RLINE PLY 2S 50X7.5XPA (Screw) IMPLANT
SPONGE LAP 4X18 RFD (DISPOSABLE) IMPLANT
SPONGE SURGIFOAM ABS GEL 100 (HEMOSTASIS) ×2 IMPLANT
STAPLER SKIN PROX WIDE 3.9 (STAPLE) IMPLANT
SUT VIC AB 1 CT1 18XBRD ANBCTR (SUTURE) ×2 IMPLANT
SUT VIC AB 1 CT1 8-18 (SUTURE) ×4
SUT VIC AB 2-0 CT1 18 (SUTURE) ×6 IMPLANT
SUT VIC AB 3-0 SH 8-18 (SUTURE) ×6 IMPLANT
SYR 20ML LL LF (SYRINGE) ×2 IMPLANT
SYR 5ML LL (SYRINGE) IMPLANT
TOWEL GREEN STERILE (TOWEL DISPOSABLE) ×3 IMPLANT
TOWEL GREEN STERILE FF (TOWEL DISPOSABLE) ×3 IMPLANT
TRAY FOLEY MTR SLVR 16FR STAT (SET/KITS/TRAYS/PACK) ×3 IMPLANT
WATER STERILE IRR 1000ML POUR (IV SOLUTION) ×3 IMPLANT

## 2019-03-16 NOTE — Interval H&P Note (Signed)
History and Physical Interval Note:  03/16/2019 9:03 AM  James [name]  has presented today for surgery, with the diagnosis of Spondylolisthesis, Lumbar region.  The various methods of treatment have been discussed with the patient and family. After consideration of risks, benefits and other options for treatment, the patient has consented to  Procedure(s) with comments: Lumbar 4-5 Lumbar 5 Sacral 1 Posterior lumbar interbody fusion (N/A) - Lumbar 4-5 Lumbar 5 Sacral 1 Posterior lumbar interbody fusion as a surgical intervention.  The patient's history has been reviewed, patient examined, no change in status, stable for surgery.  I have reviewed the patient's chart and labs.  Questions were answered to the patient's satisfaction.     [name]

## 2019-03-16 NOTE — Anesthesia Procedure Notes (Signed)
Procedure Name: Intubation Date/Time: 03/16/2019 11:25 AM Performed by: Marsa Aris, CRNA Pre-anesthesia Checklist: Patient identified, Emergency Drugs available, Suction available and Patient being monitored Patient Re-evaluated:Patient Re-evaluated prior to induction Oxygen Delivery Method: Circle System Utilized Preoxygenation: Pre-oxygenation with 100% oxygen Induction Type: IV induction Ventilation: Mask ventilation without difficulty Laryngoscope Size: Miller and 2 Grade View: Grade I Tube type: Oral Tube size: 7.5 mm Number of attempts: 1 Airway Equipment and Method: Stylet and Bite block Placement Confirmation: ETT inserted through vocal cords under direct vision,  positive ETCO2 and breath sounds checked- equal and bilateral Secured at: 22 cm Tube secured with: Tape Dental Injury: Teeth and Oropharynx as per pre-operative assessment  Comments: No change in dentition from pre-procedure

## 2019-03-16 NOTE — Op Note (Signed)
03/16/2019  3:59 PM  PATIENT:  James [name]  63 y.o. male  PRE-OPERATIVE DIAGNOSIS:  Spondylolisthesis, Lumbar region, stenosis, epidural lipomatosis, radiculopathy, lumbago L 45 and L 5 S 1 levels  POST-OPERATIVE DIAGNOSIS:  Spondylolisthesis, Lumbar region, stenosis, epidural lipomatosis, radiculopathy, lumbago L 45 and L 5 S 1 levels   PROCEDURE:  Procedure(s): Lumbar four-five Lumbar five Sacral one Posterior lumbar interbody fusion (N/A) with PEEK cages, autograft, pedicle screw fixation and posterolateral arthrodesis with autograft  SURGEON:  Surgeon(s) and Role:    * Luceal Hollibaugh, MD - Primary    * Jenkins, Jeffrey, MD - Assisting  PHYSICIAN ASSISTANT:   ASSISTANTS: Poteat, RN   ANESTHESIA:   general  EBL:  400 mL   BLOOD ADMINISTERED:125 CC PRBC  DRAINS: (Medium ) Hemovact drain(s) in the epidural space with  Suction Open   LOCAL MEDICATIONS USED:  MARCAINE    and LIDOCAINE   SPECIMEN:  No Specimen  DISPOSITION OF SPECIMEN:  N/A  COUNTS:  YES  TOURNIQUET:  * No tourniquets in log *  DICTATION: Patient is 63-year-old man with  Spondylolisthesis, spondylosis, scoliosis, stenosis, DDD, radiculopathy L4/5, L5/S1. She has a severe bilateral leg pain and weakness. It was elected to take him to surgery for decompression and fusion at L4/5 and L5/S1 levels.    Procedure: Patient was placed in a prone position on the [place] table after smooth and uncomplicated induction of general endotracheal anesthesia. His low back was prepped and draped in usual sterile fashion with betadine scrub and DuraPrep. Area of incision was infiltrated with local lidocaine. Incision was made to the lumbodorsal fascia was incised and exposure was performed of the L4/5, L5/S1 spinous processes laminae facet joint and transverse processes. Intraoperative x-ray was obtained which confirmed correct orientation. A total laminectomy of L4 and L5 was performed with disarticulation of the facet joints  at this level and thorough decompression was performed of both L4, L5 and S1 nerve roots along with the common dural tube. There was densely adherent spondylytic material compressing the thecal sac and both L4 and L5 nerve roots. There was also extensive epidural lipomatosis with epidural fat compressing all neural elements.  This fat was removed with significant decompression of thecal sac and all neural elements.   Decompression was greater than would be typically performed for simple interbody fusion. A thorough discectomy and preparation of the endplates was performed at both the L4/5 and L5/S1 levels.  The interspaces were packed with extra small BMP and autograft, and PEEK cages. Bone autograft was packed within the interspace bilaterally along with small BMP kit and NexOss bone graft extender. 10 cc of autograft was placed medial to the first cage in the L5/S1 interspace along with paired  12 x 9 x 23 mm x 12 degreemm PEEK PLIF spacers. After a thorough decompression with bilateral discectomy at L4/5, bilateral 12 x 8 x 23 mm x 8 degree mm peek cages were packed with BMP and autograft and was inserted the interspace and countersunk appropriately with 10 cc autograft medial to the second cage. The posterolateral region was extensively decorticated and pedicle probes were placed at L4, L5 and S1 bilaterally. Intraoperative fluoroscopy confirmed correct orientationin the AP and lateral plane. 50 x 7.5 mm pedicle screws were placed at S1 bilaterally and 50 x 7.5 mm screws placed at L5 bilaterally and 50 x 7.5mm screws were placed at L4 bilaterally.  Final x-rays demonstrated well-positioned interbody grafts and pedicle screw fixation. 70 mm lordotic rods   were placed and locked down in situ and the posterolateral region was packed with the remaining 20 cc bone autograft  on the right. A medium Hemovac drain was placed and anchored with a stitch.  Long-acting Marcaine was injected in the deep musculature.  Fascia  was closed with 1 Vicryl sutures skin edges were reapproximated 2 and 3-0 Vicryl sutures. The wound is dressed with Dermabond and an occlusive dressing. The patient was extubated in the operating room and taken to recovery in stable satisfactory condition having tolerated the operation well. Counts were correct at the end of the case.    PLAN OF CARE: Admit to inpatient   PATIENT DISPOSITION:  PACU - hemodynamically stable.   Delay start of Pharmacological VTE agent (>24hrs) due to surgical blood loss or risk of bleeding: yes  

## 2019-03-16 NOTE — Anesthesia Postprocedure Evaluation (Signed)
Anesthesia Post Note  Patient: SAMAEL BLADES  Procedure(s) Performed: Lumbar four-five Lumbar five Sacral one Posterior lumbar interbody fusion (N/A Back)     Patient location during evaluation: PACU Anesthesia Type: General Level of consciousness: awake and alert Pain management: pain level controlled Vital Signs Assessment: post-procedure vital signs reviewed and stable Respiratory status: spontaneous breathing, nonlabored ventilation and respiratory function stable Cardiovascular status: blood pressure returned to baseline and stable Postop Assessment: no apparent nausea or vomiting Anesthetic complications: no    Last Vitals:  Vitals:   03/16/19 1715 03/16/19 1726  BP: 127/77 127/77  Pulse: 77 74  Resp: 17   Temp:    SpO2: 100% 97%    Last Pain:  Vitals:   03/16/19 1703  TempSrc:   PainSc: 6                  Catalina Gravel

## 2019-03-16 NOTE — Transfer of Care (Signed)
Immediate Anesthesia Transfer of Care Note  Patient: James [name]  Procedure(s) Performed: Lumbar four-five Lumbar five Sacral one Posterior lumbar interbody fusion (N/A Back)  Patient Location: PACU  Anesthesia Type:General  Level of Consciousness: awake, alert  and oriented  Airway & Oxygen Therapy: Patient Spontanous Breathing and Patient connected to face mask oxygen  Post-op Assessment: Report given to RN and Post -op Vital signs reviewed and stable  Post vital signs: Reviewed and stable bp 144/93  Last Vitals:  Vitals Value Taken Time  BP    Temp 36.6 C 03/16/19 1610  Pulse 77 03/16/19 1612  Resp 17 03/16/19 1612  SpO2 100 % 03/16/19 1612  Vitals shown include unvalidated device data.  Last Pain:  Vitals:   03/16/19 0911  TempSrc:   PainSc: 3       Patients Stated Pain Goal: 2 (03/31/48 6116)  Complications: No apparent anesthesia complications

## 2019-03-16 NOTE — Brief Op Note (Signed)
03/16/2019  3:59 PM  PATIENT:  James [name]  63 y.o. male  PRE-OPERATIVE DIAGNOSIS:  Spondylolisthesis, Lumbar region, stenosis, epidural lipomatosis, radiculopathy, lumbago L 45 and L 5 S 1 levels  POST-OPERATIVE DIAGNOSIS:  Spondylolisthesis, Lumbar region, stenosis, epidural lipomatosis, radiculopathy, lumbago L 45 and L 5 S 1 levels   PROCEDURE:  Procedure(s): Lumbar four-five Lumbar five Sacral one Posterior lumbar interbody fusion (N/A) with PEEK cages, autograft, pedicle screw fixation and posterolateral arthrodesis with autograft  SURGEON:  Surgeon(s) and Role:    Erline Levine, MD - Primary    * Newman Pies, MD - Assisting  PHYSICIAN ASSISTANT:   ASSISTANTS: Poteat, RN   ANESTHESIA:   general  EBL:  400 mL   BLOOD ADMINISTERED:125 CC PRBC  DRAINS: (Medium ) Hemovact drain(s) in the epidural space with  Suction Open   LOCAL MEDICATIONS USED:  MARCAINE    and LIDOCAINE   SPECIMEN:  No Specimen  DISPOSITION OF SPECIMEN:  N/A  COUNTS:  YES  TOURNIQUET:  * No tourniquets in log *  DICTATION: Patient is 63 year old man with  Spondylolisthesis, spondylosis, scoliosis, stenosis, DDD, radiculopathy L4/5, L5/S1. She has a severe bilateral leg pain and weakness. It was elected to take him to surgery for decompression and fusion at L4/5 and L5/S1 levels.    Procedure: Patient was placed in a prone position on the [place] table after smooth and uncomplicated induction of general endotracheal anesthesia. His low back was prepped and draped in usual sterile fashion with betadine scrub and DuraPrep. Area of incision was infiltrated with local lidocaine. Incision was made to the lumbodorsal fascia was incised and exposure was performed of the L4/5, L5/S1 spinous processes laminae facet joint and transverse processes. Intraoperative x-ray was obtained which confirmed correct orientation. A total laminectomy of L4 and L5 was performed with disarticulation of the facet joints  at this level and thorough decompression was performed of both L4, L5 and S1 nerve roots along with the common dural tube. There was densely adherent spondylytic material compressing the thecal sac and both L4 and L5 nerve roots. There was also extensive epidural lipomatosis with epidural fat compressing all neural elements.  This fat was removed with significant decompression of thecal sac and all neural elements.   Decompression was greater than would be typically performed for simple interbody fusion. A thorough discectomy and preparation of the endplates was performed at both the L4/5 and L5/S1 levels.  The interspaces were packed with extra small BMP and autograft, and PEEK cages. Bone autograft was packed within the interspace bilaterally along with small BMP kit and NexOss bone graft extender. 10 cc of autograft was placed medial to the first cage in the L5/S1 interspace along with paired  12 x 9 x 23 mm x 12 degreemm PEEK PLIF spacers. After a thorough decompression with bilateral discectomy at L4/5, bilateral 12 x 8 x 23 mm x 8 degree mm peek cages were packed with BMP and autograft and was inserted the interspace and countersunk appropriately with 10 cc autograft medial to the second cage. The posterolateral region was extensively decorticated and pedicle probes were placed at L4, L5 and S1 bilaterally. Intraoperative fluoroscopy confirmed correct orientationin the AP and lateral plane. 50 x 7.5 mm pedicle screws were placed at S1 bilaterally and 50 x 7.5 mm screws placed at L5 bilaterally and 50 x 7.64m screws were placed at L4 bilaterally.  Final x-rays demonstrated well-positioned interbody grafts and pedicle screw fixation. 70 mm lordotic rods  were placed and locked down in situ and the posterolateral region was packed with the remaining 20 cc bone autograft  on the right. A medium Hemovac drain was placed and anchored with a stitch.  Long-acting Marcaine was injected in the deep musculature.  Fascia  was closed with 1 Vicryl sutures skin edges were reapproximated 2 and 3-0 Vicryl sutures. The wound is dressed with Dermabond and an occlusive dressing. The patient was extubated in the operating room and taken to recovery in stable satisfactory condition having tolerated the operation well. Counts were correct at the end of the case.    PLAN OF CARE: Admit to inpatient   PATIENT DISPOSITION:  PACU - hemodynamically stable.   Delay start of Pharmacological VTE agent (>24hrs) due to surgical blood loss or risk of bleeding: yes

## 2019-03-17 LAB — GLUCOSE, CAPILLARY: Glucose-Capillary: 137 mg/dL — ABNORMAL HIGH (ref 70–99)

## 2019-03-17 MED ORDER — DIPHENHYDRAMINE HCL 25 MG PO CAPS
25.0000 mg | ORAL_CAPSULE | Freq: Three times a day (TID) | ORAL | Status: DC | PRN
  Administered 2019-03-17: 50 mg via ORAL
  Filled 2019-03-17: qty 2

## 2019-03-17 MED ORDER — HYDROCODONE-ACETAMINOPHEN 5-325 MG PO TABS: 1.0000 | ORAL_TABLET | Freq: Four times a day (QID) | ORAL | 0 refills | Status: DC | PRN

## 2019-03-17 MED ORDER — METHOCARBAMOL 500 MG PO TABS: 500.0000 mg | ORAL_TABLET | Freq: Four times a day (QID) | ORAL | 1 refills | Status: DC | PRN

## 2019-03-17 MED FILL — HYDROCODON-APAP 5-325: 5-325 | 4 days supply | Qty: 30 | Fill #0

## 2019-03-17 MED FILL — METHOCARBAMOL 500 MG TABS: 500 | 15 days supply | Qty: 60 | Fill #0

## 2019-03-17 NOTE — Discharge Instructions (Signed)
Wound Care Leave incision open to air. AFTER  You may shower. Do not scrub directly on incision.  Do not put any creams, lotions, or ointments on incision. Activity Walk each and every day, increasing distance each day. No lifting greater than 5 lbs.  Avoid bending, arching, and twisting. No driving for 2 weeks; may ride as a passenger locally. If provided with back brace, wear when out of bed.  It is not necessary to wear in bed. Diet Resume your normal diet.  Return to Work Will be discussed at you follow up appointment. Call Your Doctor If Any of These Occur Redness, drainage, or swelling at the wound.  Temperature greater than 101 degrees. Severe pain not relieved by pain medication. Incision starts to come apart. Follow Up Appt Call today for appointment in 3-4 weeks ([phone]) or for problems.  If you have any hardware placed in your spine, you will need an x-ray before your appointment.

## 2019-03-17 NOTE — Progress Notes (Addendum)
Subjective: Patient reports "Just sore"  Objective: Vital signs in last 24 hours: Temp:  [97.5 F (36.4 C)-98.5 F (36.9 C)] 98.5 F (36.9 C) (09/18 0739) Pulse Rate:  [65-86] 75 (09/18 0739) Resp:  [11-23] 17 (09/18 0739) BP: (113-152)/(74-99) 129/77 (09/18 0739) SpO2:  [90 %-100 %] 97 % (09/18 0739)  Intake/Output from previous day: 09/17 0701 - 09/18 0700 In: 2660 [P.O.:240; I.V.:1800; Blood:120; IV Piggyback:500] Out: 2240 [Urine:1375; Drains:465; Blood:400] Intake/Output this shift: No intake/output data recorded.  Alert, conversant, reports some lumbar soreness. Incision flat without erythema or drainage beneath honeycomb and dermabond. good strength BLE. Ambulating.   Lab Results: No results for input(s): WBC, HGB, HCT, PLT in the last 72 hours. BMET No results for input(s): NA, K, CL, CO2, GLUCOSE, BUN, CREATININE, CALCIUM in the last 72 hours.  Studies/Results: Dg Lumbar Spine 2-3 Views  Result Date: 03/16/2019 CLINICAL DATA:  L4-S1 PLIF. EXAM: LUMBAR SPINE - 2-3 VIEW; DG C-ARM 1-60 MIN COMPARISON:  09/08/2018 FINDINGS: Examination demonstrates evidence of patient's recent posterior fusion from L4-S1 with hardware intact. Interbody fusion at the L4-5 and L5-S1 disc spaces. Vertebral body alignment and heights are normal. Minimal spondylosis is present. IMPRESSION: Intraoperative localization as described for posterior fusion of L4-S1 with hardware intact. Electronically Signed   By: Marin Olp M.D.   On: 03/16/2019 15:31   Dg C-arm 1-60 Min  Result Date: 03/16/2019 CLINICAL DATA:  L4-S1 PLIF. EXAM: LUMBAR SPINE - 2-3 VIEW; DG C-ARM 1-60 MIN COMPARISON:  09/08/2018 FINDINGS: Examination demonstrates evidence of patient's recent posterior fusion from L4-S1 with hardware intact. Interbody fusion at the L4-5 and L5-S1 disc spaces. Vertebral body alignment and heights are normal. Minimal spondylosis is present. IMPRESSION: Intraoperative localization as described for  posterior fusion of L4-S1 with hardware intact. Electronically Signed   By: Marin Olp M.D.   On: 03/16/2019 15:31    Assessment/Plan: improving  LOS: 1 day  Per Dr. Vertell Limber, d/c IV, d/c to home. Pt verbalizes understanding of d/c instructions and agrees to call office to schedule 3-4 wk f/u appt. Rx's Norco 5/325 and Robaxin 571m will be eRx'ed to his pharmcy for prn home use.   PVerdis Prime9/18/2020, 9:18 AM   Patient is doing well.  Discharge home.

## 2019-03-17 NOTE — Plan of Care (Signed)
Patient alert and oriented, mae's well, voiding adequate amount of urine, swallowing without difficulty, no c/o pain at time of discharge. Patient discharged home with family. Script and discharged instructions given to patient. Patient and family stated understanding of instructions given. Patient has an appointment with Dr. Vertell Limber

## 2019-03-17 NOTE — Evaluation (Signed)
Occupational Therapy Evaluation Patient Details Name: James [name] MRN: [id number] DOB: 06-23-1956 Today's Date: 03/17/2019    History of Present Illness Patient is a 63 y/o male who presents s/p L4-5, L5-S1 PLIF 03/16/19. PMH includes neuropathy, HTN, Hep, DM, depression, Cirrhosis.   Clinical Impression   Patient evaluated by Occupational Therapy with no further acute OT needs identified. All education has been completed and the patient has no further questions. Pt is unable to perform figure 4 to access feet for LB ADLs, but reports wife will assist him until he is able to perform (was able to perform figure 4 PTA).  All instruction completed.  See below for any follow-up Occupational Therapy or equipment needs. OT is signing off. Thank you for this referral.      Follow Up Recommendations  No OT follow up;Supervision/Assistance - 24 hour(initially )    Equipment Recommendations  None recommended by OT    Recommendations for Other Services       Precautions / Restrictions Precautions Precautions: Back;Fall Precaution Booklet Issued: Yes (comment) Precaution Comments: Reviewed back precautions/handout Required Braces or Orthoses: Spinal Brace Spinal Brace: Lumbar corset;Applied in standing position Restrictions Weight Bearing Restrictions: No      Mobility Bed Mobility Overal bed mobility: Needs Assistance Bed Mobility: Rolling;Sidelying to Sit;Sit to Sidelying Rolling: Supervision Sidelying to sit: Supervision     Sit to sidelying: Supervision General bed mobility comments: HOB minimally elevated, no use of rail to simulate home; cues for log roll technique. Increased time/effort.  Transfers Overall transfer level: Needs assistance Equipment used: None;Rolling walker (2 wheeled) Transfers: Sit to/from [employer] Sit to Stand: Min guard Stand pivot transfers: Min guard       General transfer comment: min guard for safety     Balance Overall  balance assessment: History of Falls;Needs assistance Sitting-balance support: Feet supported;No upper extremity supported Sitting balance-Leahy Scale: Good Sitting balance - Comments: Able to donn brace without assist.   Standing balance support: During functional activity Standing balance-Leahy Scale: Fair Standing balance comment: Able to stand statically without UE support but requires Ue support for longer distances.                           ADL either performed or assessed with clinical judgement   ADL Overall ADL's : Needs assistance/impaired Eating/Feeding: Independent   Grooming: Wash/dry hands;Wash/dry face;Oral care;Min guard;Standing Grooming Details (indicate cue type and reason): reviewed safe technique for shaving and oral care  Upper Body Bathing: Set up;Supervision/ safety;Sitting   Lower Body Bathing: Minimal assistance;Sit to/from stand Lower Body Bathing Details (indicate cue type and reason): unable to perform figure 4 - reports wife can assist until he is able to access feet  Upper Body Dressing : Set up;Supervision/safety;Sitting   Lower Body Dressing: Sit to/from stand;Moderate assistance Lower Body Dressing Details (indicate cue type and reason): unable to perform figure 4.  Discussed option of AE, but reports he was able to perform figure 4 PTA, and wife will assist him until he is again able to do so.  He does have a reacher - instructed him in use of reacher for donning pants   Toilet Transfer: Min guard;Ambulation;Comfort height toilet;RW   Toileting- Clothing Manipulation and Hygiene: Supervision/safety;Sit to/from stand   Tub/ Shower Transfer: Walk-in shower;Min guard;Ambulation;Shower seat;Grab Engineer, agricultural Details (indicate cue type and reason): recommend pt sit to shower until he has less pain and is more stable.  He  verbalizes agreement  Functional mobility during ADLs: Min guard;Rolling walker General ADL  Comments: Pt able to don brace with supervision/set up.  Reviewed back precautions and safety with ADLs      Vision         Perception     Praxis      Pertinent Vitals/Pain Pain Assessment: Faces Faces Pain Scale: Hurts even more Pain Location: back, LEs Pain Descriptors / Indicators: Sharp;Sore;Operative site guarding;Grimacing Pain Intervention(s): Monitored during session     Hand Dominance     Extremity/Trunk Assessment Upper Extremity Assessment Upper Extremity Assessment: Overall WFL for tasks assessed   Lower Extremity Assessment Lower Extremity Assessment: Defer to PT evaluation RLE Sensation: history of peripheral neuropathy(foot) LLE Sensation: history of peripheral neuropathy(foot)   Cervical / Trunk Assessment Cervical / Trunk Assessment: Other exceptions Cervical / Trunk Exceptions: s/p spine surgery   Communication Communication Communication: No difficulties   Cognition Arousal/Alertness: Awake/alert Behavior During Therapy: WFL for tasks assessed/performed Overall Cognitive Status: Within Functional Limits for tasks assessed                                     General Comments  Incision- clean, dry and intact.    Exercises     Shoulder Instructions      Home Living Family/patient expects to be discharged to:: Private residence Living Arrangements: Spouse/significant other;Children Available Help at Discharge: Family;Available 24 hours/day Type of Home: House Home Access: Stairs to enter [employer] of Steps: 1 entry step   Home Layout: One level     Bathroom Shower/Tub: Occupational psychologist: Handicapped height Bathroom Accessibility: Yes How Accessible: Accessible via walker Home Equipment: [place] - 2 wheels;Cane - single point;Shower seat - built in;Grab bars - tub/shower;Adaptive equipment Adaptive Equipment: Reacher        Prior Functioning/Environment Level of Independence: Independent         Comments: 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 Reports 1 fall a few days before surgery.        OT Problem List: Decreased activity tolerance;Decreased knowledge of use of DME or AE;Decreased knowledge of precautions;Obesity;Pain      OT Treatment/Interventions:      OT Goals(Current goals can be found in the care plan section) Acute Rehab OT Goals Patient Stated Goal: to have less pain  OT Goal Formulation: All assessment and education complete, DC therapy  OT Frequency:     Barriers to D/C:            Co-evaluation              AM-PAC OT "6 Clicks" Daily Activity     Outcome Measure Help from another person eating meals?: None Help from another person taking care of personal grooming?: A Little Help from another person toileting, which includes using toliet, bedpan, or urinal?: A Little Help from another person bathing (including washing, rinsing, drying)?: A Little Help from another person to put on and taking off regular upper body clothing?: A Little Help from another person to put on and taking off regular lower body clothing?: A Lot 6 Click Score: 18   End of Session Equipment Utilized During Treatment: Gait belt;Back brace Nurse Communication: Mobility status  Activity Tolerance: Patient tolerated treatment well Patient left: in bed;with call bell/phone within reach  OT Visit Diagnosis: Pain Pain - part of body: (back )  Time: 2883-3744 OT Time Calculation (min): 17 min Charges:  OT General Charges $OT Visit: 1 Visit OT Evaluation $OT Eval Low Complexity: 1 Low  Lucille Passy, OTR/L [place] Pager [phone] Office [phone]   Lucille Passy M 03/17/2019, 10:09 AM

## 2019-03-17 NOTE — Consult Note (Signed)
   [facility] CM Inpatient Consult   03/17/2019  James [name] [birth year] [id number]   Patient screened for pending follow up status with [facility] for post hospital care needs in the [facility] insurance plan.  MD discharge diagnosis for this admission includes but not limited to as follows:  Spondylolisthesis, Lumbar region, stenosis, epidural lipomatosis, radiculopathy, lumbago L 45 and L 5 S 1 levelss/pLumbar four-five Lumbar five Sacral one Posterior lumbar interbody fusion (N/A) with PEEK cages, autograft, pedicle screw fixation and posterolateral arthrodesis with autograft  Spoke with the patient [HIPAA verified] and planning for returning home today.  Denies any major issues for post hospital follow up.  He verbally agrees to post hospital follow up.  Plan:  Patient to be followed for transition of care needs by a [facility].   Please place a [facility] Care Management consult as appropriate for additional care needs and for questions contact:   Natividad Brood, RN BSN [facility] Liaison  [phone] business mobile phone Toll free office [phone]  Fax number: [phone] [email or web address]

## 2019-03-17 NOTE — Evaluation (Signed)
Physical Therapy Evaluation Patient Details Name: James [name] MRN: [id number] DOB: [birth year] Today's Date: 03/17/2019   History of Present Illness  Patient is a 62 y/o male who presents s/p L4-5, L5-S1 PLIF 03/16/19. PMH includes neuropathy, HTN, Hep, DM, depression, Cirrhosis.  Clinical Impression  Patient presents with pain and post surgical deficits s/p above surgery. Pt independent PTA and lives with his wife and child with Down syndrome. 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 Today, pt requires supervision for bed mobility and transfers and Min-Min guard assist for gait training due to pain and bil knee instability. Requires standing rest break halfway through ambulation and HHA on return. Encouraged use of RW at home to decrease fall risk. Pt agreeable. Education re: back precautions, brace, positioning, log roll technique, walking program etc. Will follow acutely to maximize independence and mobility prior to return home.     Follow Up Recommendations No PT follow up;Supervision for mobility/OOB    Equipment Recommendations  None recommended by PT    Recommendations for Other Services       Precautions / Restrictions Precautions Precautions: Back;Fall Precaution Booklet Issued: Yes (comment) Precaution Comments: Reviewed back precautions/handout Required Braces or Orthoses: Spinal Brace Spinal Brace: Lumbar corset;Applied in standing position Restrictions Weight Bearing Restrictions: No      Mobility  Bed Mobility Overal bed mobility: Needs Assistance Bed Mobility: Rolling;Sidelying to Sit;Sit to Sidelying Rolling: Supervision Sidelying to sit: Supervision     Sit to sidelying: Supervision General bed mobility comments: HOB minimally elevated, no use of rail to simulate home; cues for log roll technique. Increased time/effort.  Transfers Overall transfer level: Needs assistance Equipment used: None Transfers: Sit  to/from Stand Sit to Stand: Min guard         General transfer comment: Min guard for safety. Stood from [employer].  Ambulation/Gait Ambulation/Gait assistance: Min assist;Min guard Gait Distance (Feet): 400 Feet Assistive device: None(rail in hallway/HHA) Gait Pattern/deviations: Step-through pattern;Wide base of support;Drifts right/left;Decreased stride length Gait velocity: decreased Gait velocity interpretation: <1.8 ft/sec, indicate of risk for recurrent falls General Gait Details: Slow, unsteady gait with bil knee instability (RLE>LLE). Reaching for rail for support, needed standing rest break due to weakness. HHA needed for walk back to room.  Stairs            Wheelchair Mobility    Modified Rankin (Stroke Patients Only)       Balance Overall balance assessment: History of Falls;Needs assistance Sitting-balance support: Feet supported;No upper extremity supported Sitting balance-Leahy Scale: Good Sitting balance - Comments: Able to donn brace without assist.   Standing balance support: During functional activity Standing balance-Leahy Scale: Fair Standing balance comment: Able to stand statically without UE support but requires Ue support for longer distances.                             Pertinent Vitals/Pain Pain Assessment: Faces Faces Pain Scale: Hurts whole lot Pain Location: back, LEs Pain Descriptors / Indicators: Sharp;Sore;Operative site guarding;Grimacing Pain Intervention(s): Repositioned;Monitored during session;Limited activity within patient's tolerance    Home Living Family/patient expects to be discharged to:: Private residence Living Arrangements: Spouse/significant other;Children(child has down syndrome) Available Help at Discharge: Family;Available 24 hours/day Type of Home: House Home Access: Stairs to enter   [employer] of Steps: 1 entry step Home Layout: One level Home Equipment: Walker - 2 wheels;Cane -  single point  Prior Function Level of Independence: Independent         Comments: 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 Reports 1 fall a few days before surgery.     Hand Dominance        Extremity/Trunk Assessment   Upper Extremity Assessment Upper Extremity Assessment: Defer to OT evaluation    Lower Extremity Assessment Lower Extremity Assessment: Generalized weakness;LLE deficits/detail;RLE deficits/detail RLE Sensation: history of peripheral neuropathy(foot) LLE Sensation: history of peripheral neuropathy(foot)    Cervical / Trunk Assessment Cervical / Trunk Assessment: Other exceptions Cervical / Trunk Exceptions: s/p spine surgery  Communication   Communication: No difficulties  Cognition Arousal/Alertness: Awake/alert Behavior During Therapy: WFL for tasks assessed/performed Overall Cognitive Status: Within Functional Limits for tasks assessed                                        General Comments General comments (skin integrity, edema, etc.): Incision- clean, dry and intact.    Exercises     Assessment/Plan    PT Assessment Patient needs continued PT services  PT Problem List Decreased strength;Decreased mobility;Pain;Impaired sensation;Decreased balance;Decreased activity tolerance;Decreased skin integrity;Decreased knowledge of precautions       PT Treatment Interventions Therapeutic activities;Gait training;Therapeutic exercise;DME instruction;Patient/family education;Balance training;Neuromuscular re-education;Functional mobility training    PT Goals (Current goals can be found in the Care Plan section)  Acute Rehab PT Goals Patient Stated Goal: to be able to take care of my fish ponds and do yard work PT Goal Formulation: With patient Time For Goal Achievement: 03/31/19 Potential to Achieve Goals: Good    Frequency Min 5X/week   Barriers to discharge        Co-evaluation                AM-PAC PT "6 Clicks" Mobility  Outcome Measure Help needed turning from your back to your side while in a flat bed without using bedrails?: None Help needed moving from lying on your back to sitting on the side of a flat bed without using bedrails?: A Little Help needed moving to and from a bed to a chair (including a wheelchair)?: A Little Help needed standing up from a chair using your arms (e.g., wheelchair or bedside chair)?: A Little Help needed to walk in hospital room?: A Little Help needed climbing 3-5 steps with a railing? : A Little 6 Click Score: 19    End of Session Equipment Utilized During Treatment: Back brace;Gait belt Activity Tolerance: Patient tolerated treatment well;Patient limited by pain Patient left: in bed;with call bell/phone within reach Nurse Communication: Mobility status PT Visit Diagnosis: Pain;Unsteadiness on feet (R26.81);Muscle weakness (generalized) (M62.81);Difficulty in walking, not elsewhere classified (R26.2) Pain - part of body: (back, BLEs)    Time: 0630-1601 PT Time Calculation (min) (ACUTE ONLY): 19 min   Charges:   PT Evaluation $PT Eval Moderate Complexity: 1 Mod          Wray Kearns, PT, DPT Acute Rehabilitation Services Pager [phone] Office [phone]      Marguarite Arbour A Sabra Heck 03/17/2019, 9:06 AM

## 2019-03-17 NOTE — Discharge Summary (Signed)
Physician Discharge Summary  Patient ID: James [name] MRN: [id number] DOB/AGE: 61-Feb-1957 61 y.o.  Admit date: 03/16/2019 Discharge date: 03/17/2019  Admission Diagnoses:Spondylolisthesis, Lumbar region, stenosis, epidural lipomatosis, radiculopathy, lumbago L 45 and L 5 S 1 levels    Discharge Diagnoses: Spondylolisthesis, Lumbar region, stenosis, epidural lipomatosis, radiculopathy, lumbago L 45 and L 5 S 1 levels s/p Lumbar four-five Lumbar five Sacral one Posterior lumbar interbody fusion (N/A) with PEEK cages, autograft, pedicle screw fixation and posterolateral arthrodesis with autograft     Active Problems:   Spondylolisthesis of lumbar region   Discharged Condition: good  Hospital Course: Mcclellan Demarais was admitted for surgery with dx lumbar stenosis and radiculopathy. Following surgery (above) he recovered well and transferred to [facility] for nursing care and therapies. He is mobilizing well with good pain control.  Consults: None  Significant Diagnostic Studies: radiology: X-Ray: intra-op  Treatments: surgery: Lumbar four-five Lumbar five Sacral one Posterior lumbar interbody fusion (N/A) with PEEK cages, autograft, pedicle screw fixation and posterolateral arthrodesis with autograft    Discharge Exam: Blood pressure 129/77, pulse 75, temperature 98.5 F (36.9 C), temperature source Oral, resp. rate 17, SpO2 97 %. Alert, conversant, reports some lumbar soreness. Incision flat without erythema or drainage beneath honeycomb and dermabond. good strength BLE. Ambulating.    Disposition: Discharge disposition: 01-Home or Self Care  Pt verbalizes understanding of d/c instructions and agrees to call office to schedule 3-4 wk f/u appt. Rx'sNorco 5/325 and Robaxin 547mwill be eRx'ed to his pharmcy for prn home use.        Discharge Instructions    Diet - low sodium heart healthy   Complete by: As directed    Increase activity slowly   Complete  by: As directed      Allergies as of 03/17/2019      Reactions   Bee Venom Swelling   Hydrocodone-homatropine Other (See Comments)   Depressed feeling   Morphine And Related Other (See Comments)   Hallucinations, back in the 80s. States has taken vicodin before w/o problems    Sulfa Drugs Cross Reactors Rash      Medication List    STOP taking these medications   Humira Pen 40 MG/0.8ML Pnkt Generic drug: Adalimumab     TAKE these medications   atorvastatin 10 MG tablet Commonly known as: LIPITOR TAKE 1 TABLET (10 MG TOTAL) BY MOUTH DAILY.   carvedilol 12.5 MG tablet Commonly known as: COREG Take 1 tablet (12.5 mg total) by mouth 2 (two) times daily with a meal.   DULoxetine 60 MG capsule Commonly known as: CYMBALTA Take 2 capsules (120 mg total) by mouth daily. What changed: when to take this   EPINEPHrine 0.3 mg/0.3 mL Soaj injection Commonly known as: EpiPen 2-Pak Inject 0.3 mLs (0.3 mg total) into the muscle once as needed for up to 1 dose.   fenofibrate micronized 134 MG capsule Commonly known as: LOFIBRA Take 1 tablet by mouth three times weekly. What changed:   how much to take  how to take this  when to take this  additional instructions   FreeStyle Freedom Lite w/Device Kit Use to check blood sugars 3 times daily. Dx code: E11.9   freestyle lancets Use to check blood sugar 3 times daily. Dx code E11.9   gabapentin 600 MG tablet Commonly known as: NEURONTIN Take 1 tablet (600 mg total) by mouth 2 (two) times daily.   Galcanezumab-gnlm 120 MG/ML Soaj Commonly known as: Emgality Inject 1 pen into the skin  every 28 (twenty-eight) days.   glimepiride 1 MG tablet Commonly known as: AMARYL TAKE 1 TABLET (1 MG TOTAL) BY MOUTH DAILY BEFORE SUPPER.   glucose blood test strip Use Freestyle Freedom Lite test strips as instructed to check blood sugar 4 times daily.   HYDROcodone-acetaminophen 5-325 MG tablet Commonly known as: NORCO/VICODIN Take  1-2 tablets by mouth every 6 (six) hours as needed for severe pain ((score 7 to 10)).   hydrocortisone 25 MG suppository Commonly known as: ANUSOL-HC Place 1 suppository (25 mg total) rectally 2 (two) times daily as needed for hemorrhoids.   Insulin Lispro Prot & Lispro (75-25) 100 UNIT/ML Kwikpen Commonly known as: HumaLOG Mix 75/25 KwikPen Inject 50 units under the skin at breakfast, 40 at lunch, and 65 at dinner. What changed:   how much to take  how to take this  when to take this  additional instructions   Insulin Pen Needle 31G X 5 MM Misc Use for insulin pen twice a day   Iron 325 (65 Fe) MG Tabs Take 1 tablet (325 mg total) by mouth 2 (two) times daily. Take on a empty stomach   levETIRAcetam 750 MG tablet Commonly known as: KEPPRA TAKE 1 TABLET (750 MG TOTAL) BY MOUTH 2 (TWO) TIMES DAILY.   metFORMIN 500 MG tablet Commonly known as: GLUCOPHAGE TAKE 1 TABLET BY MOUTH DAILY WITH A MEAL What changed:   how much to take  how to take this  when to take this  additional instructions   methocarbamol 500 MG tablet Commonly known as: ROBAXIN Take 1 tablet (500 mg total) by mouth every 6 (six) hours as needed for muscle spasms.   omeprazole 40 MG capsule Commonly known as: PRILOSEC Take 1 capsule (40 mg total) by mouth 2 (two) times daily before a meal.   Rybelsus 14 MG Tabs Generic drug: Semaglutide Take 1 tablet by mouth daily. Take 1 tablet by mouth once daily. What changed:   how much to take  additional instructions      Follow-up Information    Erline Levine, MD Follow up.   Specialty: Neurosurgery Contact information: [address] [place] [address] [place] [address] [phone]           Signed: [name], MD 03/17/2019, 9:39 AM

## 2019-03-19 ENCOUNTER — Inpatient Hospital Stay (HOSPITAL_COMMUNITY)
Admission: EM | Admit: 2019-03-19 | Discharge: 2019-03-21 | DRG: 948 | Disposition: A | Discharge status: Home / Self Care | Payer: 59 | Attending: Neurosurgery | Admitting: Neurosurgery

## 2019-03-19 ENCOUNTER — Emergency Department (HOSPITAL_COMMUNITY): Payer: 59

## 2019-03-19 ENCOUNTER — Other Ambulatory Visit: Payer: Self-pay

## 2019-03-19 ENCOUNTER — Encounter (HOSPITAL_COMMUNITY): Payer: Self-pay | Admitting: Emergency Medicine

## 2019-03-19 DIAGNOSIS — K7581 Nonalcoholic steatohepatitis (NASH): Secondary | ICD-10-CM | POA: Diagnosis present

## 2019-03-19 DIAGNOSIS — Z79891 Long term (current) use of opiate analgesic: Secondary | ICD-10-CM

## 2019-03-19 DIAGNOSIS — K219 Gastro-esophageal reflux disease without esophagitis: Secondary | ICD-10-CM | POA: Diagnosis present

## 2019-03-19 DIAGNOSIS — K7460 Unspecified cirrhosis of liver: Secondary | ICD-10-CM | POA: Diagnosis present

## 2019-03-19 DIAGNOSIS — E662 Morbid (severe) obesity with alveolar hypoventilation: Secondary | ICD-10-CM | POA: Diagnosis present

## 2019-03-19 DIAGNOSIS — Z20828 Contact with and (suspected) exposure to other viral communicable diseases: Secondary | ICD-10-CM | POA: Diagnosis present

## 2019-03-19 DIAGNOSIS — Z79899 Other long term (current) drug therapy: Secondary | ICD-10-CM

## 2019-03-19 DIAGNOSIS — Z885 Allergy status to narcotic agent status: Secondary | ICD-10-CM | POA: Diagnosis not present

## 2019-03-19 DIAGNOSIS — D1779 Benign lipomatous neoplasm of other sites: Secondary | ICD-10-CM | POA: Diagnosis present

## 2019-03-19 DIAGNOSIS — F329 Major depressive disorder, single episode, unspecified: Secondary | ICD-10-CM | POA: Diagnosis present

## 2019-03-19 DIAGNOSIS — K5900 Constipation, unspecified: Secondary | ICD-10-CM | POA: Diagnosis present

## 2019-03-19 DIAGNOSIS — I10 Essential (primary) hypertension: Secondary | ICD-10-CM | POA: Diagnosis present

## 2019-03-19 DIAGNOSIS — E1140 Type 2 diabetes mellitus with diabetic neuropathy, unspecified: Secondary | ICD-10-CM | POA: Diagnosis present

## 2019-03-19 DIAGNOSIS — Z91030 Bee allergy status: Secondary | ICD-10-CM | POA: Diagnosis not present

## 2019-03-19 DIAGNOSIS — Z87442 Personal history of urinary calculi: Secondary | ICD-10-CM

## 2019-03-19 DIAGNOSIS — Z96642 Presence of left artificial hip joint: Secondary | ICD-10-CM | POA: Diagnosis present

## 2019-03-19 DIAGNOSIS — Z6839 Body mass index (BMI) 39.0-39.9, adult: Secondary | ICD-10-CM | POA: Diagnosis not present

## 2019-03-19 DIAGNOSIS — Z86010 Personal history of colonic polyps: Secondary | ICD-10-CM | POA: Diagnosis not present

## 2019-03-19 DIAGNOSIS — I2510 Atherosclerotic heart disease of native coronary artery without angina pectoris: Secondary | ICD-10-CM | POA: Diagnosis present

## 2019-03-19 DIAGNOSIS — Z794 Long term (current) use of insulin: Secondary | ICD-10-CM

## 2019-03-19 DIAGNOSIS — Z03818 Encounter for observation for suspected exposure to other biological agents ruled out: Secondary | ICD-10-CM | POA: Diagnosis not present

## 2019-03-19 DIAGNOSIS — Z0389 Encounter for observation for other suspected diseases and conditions ruled out: Secondary | ICD-10-CM | POA: Diagnosis not present

## 2019-03-19 DIAGNOSIS — Z981 Arthrodesis status: Secondary | ICD-10-CM

## 2019-03-19 DIAGNOSIS — E882 Lipomatosis, not elsewhere classified: Secondary | ICD-10-CM

## 2019-03-19 DIAGNOSIS — R339 Retention of urine, unspecified: Secondary | ICD-10-CM | POA: Diagnosis present

## 2019-03-19 DIAGNOSIS — G8918 Other acute postprocedural pain: Principal | ICD-10-CM | POA: Diagnosis present

## 2019-03-19 DIAGNOSIS — Z8249 Family history of ischemic heart disease and other diseases of the circulatory system: Secondary | ICD-10-CM | POA: Diagnosis not present

## 2019-03-19 DIAGNOSIS — Z882 Allergy status to sulfonamides status: Secondary | ICD-10-CM | POA: Diagnosis not present

## 2019-03-19 DIAGNOSIS — G4700 Insomnia, unspecified: Secondary | ICD-10-CM | POA: Diagnosis present

## 2019-03-19 DIAGNOSIS — M6281 Muscle weakness (generalized): Secondary | ICD-10-CM | POA: Diagnosis not present

## 2019-03-19 HISTORY — DX: Other acute postprocedural pain: G89.18

## 2019-03-19 LAB — CBC WITH DIFFERENTIAL/PLATELET
Abs Immature Granulocytes: 0.06 10*3/uL (ref 0.00–0.07)
Basophils Absolute: 0 10*3/uL (ref 0.0–0.1)
Basophils Relative: 0 %
Eosinophils Absolute: 0.2 10*3/uL (ref 0.0–0.5)
Eosinophils Relative: 3 %
HCT: 31.8 % — ABNORMAL LOW (ref 39.0–52.0)
Hemoglobin: 10.4 g/dL — ABNORMAL LOW (ref 13.0–17.0)
Immature Granulocytes: 1 %
Lymphocytes Relative: 18 %
Lymphs Abs: 1.5 10*3/uL (ref 0.7–4.0)
MCH: 28.7 pg (ref 26.0–34.0)
MCHC: 32.7 g/dL (ref 30.0–36.0)
MCV: 87.6 fL (ref 80.0–100.0)
Monocytes Absolute: 0.9 10*3/uL (ref 0.1–1.0)
Monocytes Relative: 10 %
Neutro Abs: 5.7 10*3/uL (ref 1.7–7.7)
Neutrophils Relative %: 68 %
Platelets: 130 10*3/uL — ABNORMAL LOW (ref 150–400)
RBC: 3.63 MIL/uL — ABNORMAL LOW (ref 4.22–5.81)
RDW: 17.2 % — ABNORMAL HIGH (ref 11.5–15.5)
WBC: 8.3 10*3/uL (ref 4.0–10.5)
nRBC: 0 % (ref 0.0–0.2)

## 2019-03-19 LAB — BASIC METABOLIC PANEL
Anion gap: 11 (ref 5–15)
BUN: 17 mg/dL (ref 8–23)
CO2: 22 mmol/L (ref 22–32)
Calcium: 8.9 mg/dL (ref 8.9–10.3)
Chloride: 102 mmol/L (ref 98–111)
Creatinine, Ser: 1.56 mg/dL — ABNORMAL HIGH (ref 0.61–1.24)
GFR calc Af Amer: 55 mL/min — ABNORMAL LOW (ref >60)
GFR calc non Af Amer: 47 mL/min — ABNORMAL LOW (ref >60)
Glucose, Bld: 136 mg/dL — ABNORMAL HIGH (ref 70–99)
Potassium: 4.5 mmol/L (ref 3.5–5.1)
Sodium: 135 mmol/L (ref 135–145)

## 2019-03-19 LAB — CBG MONITORING, ED: Glucose-Capillary: 117 mg/dL — ABNORMAL HIGH (ref 70–99)

## 2019-03-19 MED ORDER — SODIUM CHLORIDE 0.9% FLUSH
3.0000 mL | INTRAVENOUS | Status: DC | PRN
  Administered 2019-03-19: 22:00:00 3 mL via INTRAVENOUS

## 2019-03-19 MED ORDER — DEXAMETHASONE SODIUM PHOSPHATE 10 MG/ML IJ SOLN
10.0000 mg | Freq: Four times a day (QID) | INTRAMUSCULAR | Status: DC
  Administered 2019-03-20 (×3): 10 mg via INTRAVENOUS
  Filled 2019-03-19 (×7): qty 1

## 2019-03-19 MED ORDER — ONDANSETRON HCL 4 MG PO TABS: 4.0000 mg | ORAL_TABLET | Freq: Four times a day (QID) | ORAL | Status: DC | PRN

## 2019-03-19 MED ORDER — HYDROMORPHONE HCL 1 MG/ML IJ SOLN
1.0000 mg | Freq: Once | INTRAMUSCULAR | Status: AC
  Administered 2019-03-19: 1 mg via INTRAVENOUS
  Filled 2019-03-19: qty 1

## 2019-03-19 MED ORDER — HYDROMORPHONE HCL 1 MG/ML IJ SOLN
0.5000 mg | INTRAMUSCULAR | Status: DC | PRN
  Administered 2019-03-19 – 2019-03-20 (×3): 1 mg via INTRAVENOUS
  Administered 2019-03-20: 09:00:00 0.5 mg via INTRAVENOUS
  Filled 2019-03-19: qty 0.5
  Filled 2019-03-19 (×3): qty 1

## 2019-03-19 MED ORDER — ONDANSETRON HCL 4 MG/2ML IJ SOLN
4.0000 mg | Freq: Four times a day (QID) | INTRAMUSCULAR | Status: DC | PRN
  Administered 2019-03-20 (×2): 4 mg via INTRAVENOUS
  Filled 2019-03-19 (×2): qty 2

## 2019-03-19 MED ORDER — SODIUM CHLORIDE 0.9% FLUSH
3.0000 mL | Freq: Two times a day (BID) | INTRAVENOUS | Status: DC
  Administered 2019-03-20 – 2019-03-21 (×3): 3 mL via INTRAVENOUS

## 2019-03-19 MED ORDER — ACETAMINOPHEN 650 MG RE SUPP: 650.0000 mg | Freq: Four times a day (QID) | RECTAL | Status: DC | PRN

## 2019-03-19 MED ORDER — OXYCODONE HCL 5 MG PO TABS
5.0000 mg | ORAL_TABLET | ORAL | Status: DC | PRN
  Administered 2019-03-20: 5 mg via ORAL
  Filled 2019-03-19 (×2): qty 1

## 2019-03-19 MED ORDER — ACETAMINOPHEN 325 MG PO TABS
650.0000 mg | ORAL_TABLET | Freq: Four times a day (QID) | ORAL | Status: DC | PRN
  Administered 2019-03-21: 650 mg via ORAL
  Filled 2019-03-19: qty 2

## 2019-03-19 MED ORDER — SODIUM CHLORIDE 0.9 % IV SOLN: 250.0000 mL | INTRAVENOUS | Status: DC | PRN

## 2019-03-19 MED ORDER — GADOBUTROL 1 MMOL/ML IV SOLN
10.0000 mL | Freq: Once | INTRAVENOUS | Status: AC | PRN
  Administered 2019-03-19: 17:00:00 10 mL via INTRAVENOUS

## 2019-03-19 MED ORDER — KETOROLAC TROMETHAMINE 30 MG/ML IJ SOLN
30.0000 mg | Freq: Four times a day (QID) | INTRAMUSCULAR | Status: DC | PRN
  Administered 2019-03-19: 30 mg via INTRAVENOUS
  Filled 2019-03-19: qty 1

## 2019-03-19 NOTE — ED Notes (Signed)
Attempted to call report.  Nurse in pt room.  Will make another attempt

## 2019-03-19 NOTE — Progress Notes (Signed)
Per Dr. Trenton Gammon pt needs to follow-up with [facility] Neurosurgery office and get his shunt checked within 72 hrs after his MRI. Pt to call their office and get a time and date scheduled when they open on Monday morning.

## 2019-03-19 NOTE — ED Triage Notes (Signed)
Last pain medication was at 0300, did not take anymore because I was coming here.

## 2019-03-19 NOTE — ED Triage Notes (Signed)
Given Hydrocodone and robaxin for pain and not helping, I also have not had a bowel movement. The pain medication after taking 2 Hydrocodone is not helping, I have to wait every 6 hours and not working.

## 2019-03-19 NOTE — ED Provider Notes (Signed)
[facility] EMERGENCY DEPARTMENT Provider Note   CSN: [id number] Arrival date & time: 03/19/19  1941     History   Chief Complaint Chief Complaint  Patient presents with  . Post-op Problem  . Leg Pain  . Urinary Retention    HPI James [name] is a 63 y.o. male who presents to the ED today complaining of worsening pain s/p L4 L5 lumbar fusion done by Dr. Vertell Limber on 09/17. Pt also complains of difficulty urinating and constipation since his surgery. Reports that he was able to urinate until last night and now has only been dribbling. Pt is still passing gas. He has been taking 2 Norco every 6 hours without relief. He called Dr. Melven Sartorius office today and was told to come to the ED for further evaluation. Denies fever, chills, nausea, vomiting, obstipation, worsening numbness in saddle region (states that this has been chronic prior to the surgery), or any other associated symptoms.        Past Medical History:  Diagnosis Date  . Acid reflux   . Arthritis   . Chronic headaches    on cymbalta  . Cirrhosis ([place])   . Colon polyps   . Complication of anesthesia    problems waking up from anesthesia  . Depression    on cymbalta  . Diabetes mellitus with neuropathy ([place])   . Diverticulitis 03/2013  . Eczema   . Elevated LFTs   . Fatty liver   . Hepatitis 10/2017   NASH cirrhosis  . History of kidney stones   . Hypertension   . Insomnia 04/26/2013  . Kidney stone   . Morbid obesity ([place])   . Neuromuscular disorder (HCC)    neuropathy  . Neuropathy   . Post-traumatic hydrocephalus (HCC)    s/p shunts x 2 (first got infected )  . Psoriasis    sees Dr [name]  . Psoriatic arthritis ([place])   . 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 Sleep apnea    no CPAP     . Stomach ulcer   . Testosterone deficiency 04/28/2011    Patient Active Problem List   Diagnosis Date Noted  . Spondylolisthesis of lumbar region 03/16/2019   . IDA (iron deficiency anemia) 01/24/2019  . Coronary artery disease 01/23/2019  . Chronic migraine 10/24/2018  . Idiopathic intracranial hypertension 01/14/2017  . REM behavioral disorder 01/14/2017  . Liver cirrhosis secondary to NASH (nonalcoholic steatohepatitis) ([place]) 01/02/2016  . H/O craniotomy 05/07/2015  . Annual physical exam 04/08/2015  . PCP NOTES >>> 04/08/2015  . Hypersomnia with sleep apnea 01/28/2015  . Severe obesity (BMI >= 40) ([place]) 01/28/2015  . Obstructive hydrocephalus ([place]) 01/28/2015  . Chronic fatigue 01/28/2015  . Depression 09/04/2014  . OSA -- dx ~ 2012, cpap intolerant 09/04/2014  . Insomnia 04/26/2013  . Sigmoid diverticulitis 04/25/2013  . Diabetes with neuropathy 04/25/2013  . Presence of cerebrospinal fluid drainage device 07/28/2011  . Psoriatic arthritis ([place]) 04/28/2011  . Headache 04/28/2011  . GERD (gastroesophageal reflux disease) 04/28/2011  . Testosterone deficiency 04/28/2011    Past Surgical History:  Procedure Laterality Date  . BACK SURGERY  1980  . BRAIN SURGERY     VP shunts placed in 2007  . CHOLECYSTECTOMY N/A 08/25/2017   Procedure: LAPAROSCOPIC CHOLECYSTECTOMY WITH INTRAOPERATIVE CHOLANGIOGRAM;  Surgeon: [name], MD;  Location: [facility];  Service: General;  Laterality: N/A;  . COLONOSCOPY    . JOINT REPLACEMENT  total hip  . SHOULDER SURGERY Left 2010  . TOE SURGERY Left 2018  . TONSILLECTOMY     as a child  . TOTAL HIP ARTHROPLASTY Left 2011  . VENTRICULOPERITONEAL SHUNT  2007   x2        Home Medications    Prior to Admission medications   Medication Sig Start Date End Date Taking? Authorizing Provider  atorvastatin (LIPITOR) 10 MG tablet TAKE 1 TABLET (10 MG TOTAL) BY MOUTH DAILY. Patient taking differently: Take 10 mg by mouth at bedtime.  08/31/18  Yes Elayne Snare, MD  carvedilol (COREG) 12.5 MG tablet Take 1 tablet (12.5 mg total) by mouth 2 (two) times daily with a meal. 02/14/19  Yes Paz, Alda Berthold, MD   DULoxetine (CYMBALTA) 60 MG capsule Take 2 capsules (120 mg total) by mouth daily. Patient taking differently: Take 120 mg by mouth at bedtime.  01/24/19  Yes Paz, Alda Berthold, MD  EPINEPHrine (EPIPEN 2-PAK) 0.3 mg/0.3 mL IJ SOAJ injection Inject 0.3 mLs (0.3 mg total) into the muscle once as needed for up to 1 dose. Patient taking differently: Inject 0.3 mg into the muscle once as needed for anaphylaxis.  01/19/18  Yes Paz, Alda Berthold, MD  fenofibrate micronized (LOFIBRA) 134 MG capsule Take 1 tablet by mouth three times weekly. Patient taking differently: Take 134 mg by mouth every Monday, Wednesday, and Friday.  01/05/19  Yes Elayne Snare, MD  gabapentin (NEURONTIN) 600 MG tablet Take 1 tablet (600 mg total) by mouth 2 (two) times daily. 01/05/19  Yes Paz, Jose E, MD  Galcanezumab-gnlm [facility]) 120 MG/ML SOAJ Inject 1 pen into the skin every 28 (twenty-eight) days. Patient taking differently: Inject 120 mg into the skin every 28 (twenty-eight) days.  10/24/18  Yes Sater, Nanine Means, MD  glimepiride (AMARYL) 1 MG tablet TAKE 1 TABLET (1 MG TOTAL) BY MOUTH DAILY BEFORE SUPPER. Patient taking differently: Take 1 mg by mouth at bedtime.  12/06/18  Yes Elayne Snare, MD  HYDROcodone-acetaminophen (NORCO/VICODIN) 5-325 MG tablet Take 1-2 tablets by mouth every 6 (six) hours as needed for severe pain ((score 7 to 10)). 03/17/19  Yes Erline Levine, MD  Insulin Lispro Prot & Lispro (HUMALOG MIX 75/25 KWIKPEN) (75-25) 100 UNIT/ML Kwikpen Inject 50 units under the skin at breakfast, 40 at lunch, and 65 at dinner. Patient taking differently: Inject 35-60 Units into the skin See admin instructions. If CBG is over 200: Inject 40 units under the skin at breakfast, 35 units at lunch, and 60 units at dinner. 02/15/19  Yes Elayne Snare, MD  levETIRAcetam (KEPPRA) 750 MG tablet TAKE 1 TABLET (750 MG TOTAL) BY MOUTH 2 (TWO) TIMES DAILY. 10/04/18  Yes Sater, Nanine Means, MD  metFORMIN (GLUCOPHAGE) 500 MG tablet TAKE 1 TABLET BY MOUTH DAILY WITH  A MEAL Patient taking differently: Take 500 mg by mouth at bedtime.  02/14/19  Yes Elayne Snare, MD  methocarbamol (ROBAXIN) 500 MG tablet Take 1 tablet (500 mg total) by mouth every 6 (six) hours as needed for muscle spasms. 03/17/19  Yes Erline Levine, MD  omeprazole (PRILOSEC) 40 MG capsule Take 1 capsule (40 mg total) by mouth 2 (two) times daily before a meal. 05/23/18  Yes Paz, Alda Berthold, MD  Semaglutide (RYBELSUS) 14 MG TABS Take 1 tablet by mouth daily. Take 1 tablet by mouth once daily. Patient taking differently: Take 14 mg by mouth daily before breakfast.  02/09/19  Yes Elayne Snare, MD  Blood Glucose Monitoring Suppl (FREESTYLE FREEDOM LITE)  w/Device KIT Use to check blood sugars 3 times daily. Dx code: E11.9 10/29/16   Elayne Snare, MD  Ferrous Sulfate (IRON) 325 (65 Fe) MG TABS Take 1 tablet (325 mg total) by mouth 2 (two) times daily. Take on a empty stomach Patient not taking: Reported on 03/03/2019 12/01/18   Colon Branch, MD  glucose blood test strip Use Freestyle Freedom Lite test strips as instructed to check blood sugar 4 times daily. 02/22/19   Elayne Snare, MD  hydrocortisone (ANUSOL-HC) 25 MG suppository Place 1 suppository (25 mg total) rectally 2 (two) times daily as needed for hemorrhoids. Patient not taking: Reported on 02/23/2019 01/24/19   Colon Branch, MD  Insulin Pen Needle 31G X 5 MM MISC Use for insulin pen twice a day 01/09/19   Elayne Snare, MD  Lancets (FREESTYLE) lancets Use to check blood sugar 3 times daily. Dx code E11.9 10/29/16   Elayne Snare, MD    Family History Family History  Problem Relation Age of Onset  . Other Mother   . Lung cancer Father        alive, former smoker   . Heart disease Brother        MI age 61  . Other Brother        Murdered  . Down syndrome Son   . Diabetes Neg Hx   . Prostate cancer Neg Hx   . Colon cancer Neg Hx   . Stomach cancer Neg Hx   . Pancreatic cancer Neg Hx   . Liver disease Neg Hx     Social History Social History   Tobacco  Use  . Smoking status: Never Smoker  . Smokeless tobacco: Never Used  Substance Use Topics  . Alcohol use: No  . Drug use: No     Allergies   Bee venom, Hydrocodone-homatropine, Morphine and related, and Sulfa drugs cross reactors   Review of Systems Review of Systems  Constitutional: Negative for chills and fever.  HENT: Negative for congestion.   Eyes: Negative for visual disturbance.  Respiratory: Negative for cough and shortness of breath.   Cardiovascular: Negative for chest pain.  Gastrointestinal: Positive for abdominal pain and constipation. Negative for diarrhea, nausea and vomiting.  Genitourinary: Positive for difficulty urinating.  Musculoskeletal: Positive for arthralgias and back pain.  Skin: Negative for rash and wound.  Neurological: Negative for syncope and headaches.     Physical Exam Updated Vital Signs BP 126/78   Pulse 82   Temp 99.3 F (37.4 C)   Resp 17   SpO2 97%   Physical Exam Vitals signs and nursing note reviewed.  Constitutional:      Appearance: He is obese. He is not ill-appearing.  HENT:     Head: Normocephalic and atraumatic.  Eyes:     Extraocular Movements: Extraocular movements intact.     Conjunctiva/sclera: Conjunctivae normal.     Pupils: Pupils are equal, round, and reactive to light.  Neck:     Musculoskeletal: Neck supple.  Cardiovascular:     Rate and Rhythm: Normal rate and regular rhythm.     Pulses: Normal pulses.  Pulmonary:     Effort: Pulmonary effort is normal.     Breath sounds: Normal breath sounds.  Abdominal:     General: There is distension.     Palpations: Abdomen is soft.     Tenderness: There is abdominal tenderness. There is no right CVA tenderness, left CVA tenderness, guarding or rebound.     Comments:  Soft,  Decreased BS throughout with TTP diffusely, no r/g/r, neg murphy's, neg mcburney's, no CVA TTP   Musculoskeletal:     Comments: Dressing present over incision site to L4 L5. No overlying  skin changes including erythema, edema, drainage.   No C or T midline tenderness. L midline spinal tenderness with paraspinal lumbar tenderness bilaterally present. Strength limited to BLEs; strength 4/5 to RLE and 3/5 to LLE. Sensation intact to dull and sharp touch throughout. 2+ DP and PT pulses.   Skin:    General: Skin is warm and dry.  Neurological:     Mental Status: He is alert.      ED Treatments / Results  Labs (all labs ordered are listed, but only abnormal results are displayed) Labs Reviewed  CBC WITH DIFFERENTIAL/PLATELET - Abnormal; Notable for the following components:      Result Value   RBC 3.63 (*)    Hemoglobin 10.4 (*)    HCT 31.8 (*)    RDW 17.2 (*)    Platelets 130 (*)    All other components within normal limits  BASIC METABOLIC PANEL - Abnormal; Notable for the following components:   Glucose, Bld 136 (*)    Creatinine, Ser 1.56 (*)    GFR calc non Af Amer 47 (*)    GFR calc Af Amer 55 (*)    All other components within normal limits  CBG MONITORING, ED - Abnormal; Notable for the following components:   Glucose-Capillary 117 (*)    All other components within normal limits  SARS CORONAVIRUS 2 (TAT 6-24 HRS)  HIV ANTIBODY (ROUTINE TESTING W REFLEX)    EKG None  Radiology Mr Lumbar Spine W Wo Contrast  Result Date: 03/19/2019 CLINICAL DATA:  Worsening back pain and lower extremity weakness. Urinary retention after spinal surgery few days ago. EXAM: MRI LUMBAR SPINE WITHOUT AND WITH CONTRAST TECHNIQUE: Multiplanar and multiecho pulse sequences of the lumbar spine were obtained without and with intravenous contrast. CONTRAST:  30m GADAVIST GADOBUTROL 1 MMOL/ML IV SOLN COMPARISON:  08/15/2018 FINDINGS: Segmentation:  Standard. Alignment:  Physiologic. Vertebrae:  No fracture, evidence of discitis, or bone lesion. Conus medullaris and cauda equina: Conus extends to the L1 level. Conus and cauda equina appear normal. Paraspinal and other soft tissues:  Postsurgical changes in the posterior paraspinal soft tissues at L4-5. Disc levels: Disc spaces: Posterior lumbar interbody fusion and decompression from L4 through S1. T12-L1: No significant disc bulge. No evidence of neural foraminal stenosis. No central canal stenosis. L1-L2: No significant disc bulge. No evidence of neural foraminal stenosis. No central canal stenosis. L2-L3: Mild broad-based disc bulge. Mild bilateral facet arthropathy. No foraminal stenosis. No central canal stenosis. L3-L4: No significant disc bulge. Prominence of the epidural fat deforming the thecal sac as can be seen with epidural lipomatosis. No evidence of neural foraminal stenosis. L4-L5: Interbody fusion. Prominence of the epidural fat deforming the thecal sac consistent with epidural lipomatosis. In the posterior surgical bed at the laminectomy site there is a 4.2 x 2.4 x 2.5 cm fluid collection further deforming the thecal sac along the lateral aspects and dorsal aspect resulting in severe spinal stenosis and possible mass effect on the intraspinal L5 nerve roots. No evidence of neural foraminal stenosis. L5-S1: Interbody fusion. Prior laminectomy. Prominence of the epidural fat deforming the thecal sac as can be seen with epidural lipomatosis. No evidence of neural foraminal stenosis. No central canal stenosis. IMPRESSION: 1. At L4-S1 there is posterior lumbar interbody fusion and decompression.  At L4-5 there is Prominence of the epidural fat deforming the thecal sac consistent with epidural lipomatosis. At L4-5, in the posterior surgical bed at the laminectomy site there is a 4.2 x 2.4 x 2.5 cm fluid collection further deforming the thecal sac along the lateral aspects and dorsal aspect resulting in severe spinal stenosis and possible mass effect on the intraspinal L5 nerve roots. 2. Epidural lipomatosis at L3-4 and L5-S1. 3. At L2-3 there is mild broad-based disc bulge. Mild bilateral facet arthropathy. Electronically Signed   By:  Kathreen Devoid   On: 03/19/2019 17:25   Dg Abd Acute W/chest  Result Date: 03/19/2019 CLINICAL DATA:  Status post fusion on Thursday. Rule out small bowel obstruction. EXAM: DG ABDOMEN ACUTE W/ 1V CHEST COMPARISON:  Chest x-ray dated 09/18/2017. Abdominal x-ray dated 09/09/2018. FINDINGS: Single-view of the chest: Stable cardiomegaly. Lungs are clear. No pleural effusion or pneumothorax is seen. Fusion rods within the thoracic spine appear stable in alignment. Supine and upright views of the abdomen: Bowel gas pattern is nonobstructive. No evidence of soft tissue mass or abnormal fluid collection. No evidence of free intraperitoneal air. No evidence of renal or ureteral calculi. Cholecystectomy clips in the RIGHT upper quadrant. PLIF hardware appears appropriately positioned within the lumbosacral spine. Ventriculoperitoneal shunt catheter terminates in the LEFT lower quadrant. IMPRESSION: 1. No active cardiopulmonary disease. No evidence of pneumonia or pulmonary edema. 2. Nonobstructive bowel gas pattern and no evidence of acute intra-abdominal abnormality. Electronically Signed   By: Franki Cabot M.D.   On: 03/19/2019 12:57    Procedures Procedures (including critical care time)  Medications Ordered in ED Medications  0.9 %  sodium chloride infusion (has no administration in time range)  acetaminophen (TYLENOL) tablet 650 mg (has no administration in time range)    Or  acetaminophen (TYLENOL) suppository 650 mg (has no administration in time range)  oxyCODONE (Oxy IR/ROXICODONE) immediate release tablet 5-10 mg (has no administration in time range)  ketorolac (TORADOL) 30 MG/ML injection 30 mg (has no administration in time range)  ondansetron (ZOFRAN) tablet 4 mg (has no administration in time range)    Or  ondansetron (ZOFRAN) injection 4 mg (has no administration in time range)  HYDROmorphone (DILAUDID) injection 1 mg (1 mg Intravenous Given 03/19/19 1442)  gadobutrol (GADAVIST) 1 MMOL/ML  injection 10 mL (10 mLs Intravenous Contrast Given 03/19/19 1704)  HYDROmorphone (DILAUDID) injection 1 mg (1 mg Intravenous Given 03/19/19 1754)     Initial Impression / Assessment and Plan / ED Course  I have reviewed the triage vital signs and the nursing notes.  Pertinent labs & imaging results that were available during my care of the patient were reviewed by me and considered in my medical decision making (see chart for details).  Clinical Course as of Mar 18 2000  Nancy Fetter Mar 19, 2019  1759 Discussed case with Dr. Annette Stable who will take a look at the MRI and call back with reccs   [MV]  1803 Dr. Annette Stable at bedside   [MV]    Clinical Course User Index [MV] Eustaquio Maize, [place]   63 year old male who presents to the ED with worsening back pain, urinary retention, and constipation s/p L4 L5 lumbar fusion 09/17 by Dr. Vertell Limber. Discussed case with Dr. Zada Finders who is on call for neurosurg - recommends MRI L spine if patient truly having urinary retention to rule out any type of hematoma that is compressing cord. Given patient is also complaining of constipation will obtain  acute abd series to rule out SBO. Bladder scan and post void residual also ordered with baseline bloodwork.   Bladder scan with 379 CCs urine. Will in and out cath patient to help decompress.   Acute abd series without any signs of SBO.   Pt given 1 mg Dilaudid for pain control. Afterwards he was able to ambulate with assistance to the restroom and was able to void on his own. Post void residual with 0 CCs. No need for in and out at this time.   Labwork reassuring today. Hgb stable compared to previous. Creatinine unchanged from baseline as well. Pending MRI at this time.   MRI with abnormal findings and fluid collection near thecal sac. Will consult neurosurgery at this time.   6:12 PM Dr. Annette Stable to admit pt at this time.   This note was prepared using Dragon voice recognition software and may include unintentional dictation  errors due to the inherent limitations of voice recognition software.       Final Clinical Impressions(s) / ED Diagnoses   Final diagnoses:  Post-op pain  Epidural lipomatosis    ED Discharge Orders    None       Eustaquio Maize, Hershal Coria 03/19/19 2001    Lacretia Leigh, MD 03/22/19 0900

## 2019-03-19 NOTE — ED Provider Notes (Signed)
Medical screening examination/treatment/procedure(s) were conducted as a shared visit with non-physician practitioner(s) and myself.  I personally evaluated the patient during the encounter.    63 year old male who presents with worsening back pain as well as lower extremity weakness with some urinary retention after he had spinal surgery few days ago.  On exam here patient has 3-5 strength in his bilateral lower extremities.  Did have 375 cc of urine.  Case was discussed with neurosurgery who recommends lumbar MRI.   Lacretia Leigh, MD 03/19/19 [id number]

## 2019-03-19 NOTE — ED Notes (Signed)
Pt bladder scanned. Highest amount scanned was 379. Informed Margaux - PA.

## 2019-03-19 NOTE — ED Triage Notes (Signed)
Pt. Stated, I had a fusion on Thursday, and was sent home on Friday. Ive not been able to urinate except a little dribble, the pain in my legs I can't sleep, unable to turn, my hips are bad. Dr. Vertell Limber was called this morning and told to come here. Marland Kitchen

## 2019-03-19 NOTE — ED Notes (Signed)
Patient transported to MRI 

## 2019-03-19 NOTE — H&P (Signed)
James [name] is an 63 y.o. male.   Chief Complaint: Back pain HPI: 63 year old male over over 3 days status post L4-5 and L5-S1 decompression and fusion surgery by Dr. Vertell Limber.  Surgery was uncomplicated but it was notable for significant resection of lumbar epidural lipomatosis as well.  Postop the patient did well.  He is able to be discharged on Friday morning.  He presents now 2 days later with severe back pain and some radiation to both lower extremities.  Patient reports difficulty standing and walking secondary to pain.  He has some burning dysesthesias into both distal lower extremities.  He has had some transient urinary retention but this is now passed and he is voiding normally.  Patient's pain is greatly increased with attempts at mobilization.  MRI scanning was done in the emergency department today which demonstrates good appearance of his decompression at L4-5 and L5-S1.  There is continued constriction of his thecal sac with surrounding fluid which is not consistent with hematoma.  Possibilities include CSF versus serous fluid secondary to inflammation.  Past Medical History:  Diagnosis Date  . Acid reflux   . Arthritis   . Chronic headaches    on cymbalta  . Cirrhosis ([place])   . Colon polyps   . Complication of anesthesia    problems waking up from anesthesia  . Depression    on cymbalta  . Diabetes mellitus with neuropathy ([place])   . Diverticulitis 03/2013  . Eczema   . Elevated LFTs   . Fatty liver   . Hepatitis 10/2017   NASH cirrhosis  . History of kidney stones   . Hypertension   . Insomnia 04/26/2013  . Kidney stone   . Morbid obesity ([place])   . Neuromuscular disorder (HCC)    neuropathy  . Neuropathy   . Post-traumatic hydrocephalus (HCC)    s/p shunts x 2 (first got infected )  . Psoriasis    sees Dr [name]  . Psoriatic arthritis ([place])   . 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 Sleep apnea    no CPAP     . Stomach  ulcer   . Testosterone deficiency 04/28/2011    Past Surgical History:  Procedure Laterality Date  . BACK SURGERY  1980  . BRAIN SURGERY     VP shunts placed in 2007  . CHOLECYSTECTOMY N/A 08/25/2017   Procedure: LAPAROSCOPIC CHOLECYSTECTOMY WITH INTRAOPERATIVE CHOLANGIOGRAM;  Surgeon: [name], MD;  Location: [facility];  Service: General;  Laterality: N/A;  . COLONOSCOPY    . JOINT REPLACEMENT     total hip  . SHOULDER SURGERY Left 2010  . TOE SURGERY Left 2018  . TONSILLECTOMY     as a child  . TOTAL HIP ARTHROPLASTY Left 2011  . VENTRICULOPERITONEAL SHUNT  2007   x2    Family History  Problem Relation Age of Onset  . Other Mother   . Lung cancer Father        alive, former smoker   . Heart disease Brother        MI age 74  . Other Brother        Murdered  . Down syndrome Son   . Diabetes Neg Hx   . Prostate cancer Neg Hx   . Colon cancer Neg Hx   . Stomach cancer Neg Hx   . Pancreatic cancer Neg Hx   . Liver disease Neg Hx    Social History:  reports that he has never smoked. He has never used smokeless tobacco. He reports that he does not drink alcohol or use drugs.  Allergies:  Allergies  Allergen Reactions  . Bee Venom Swelling  . Hydrocodone-Homatropine Other (See Comments)    Depressed feeling  . Morphine And Related Other (See Comments)    Hallucinations, back in the 80s. States has taken vicodin before w/o problems   . Sulfa Drugs Cross Reactors Rash    (Not in a hospital admission)   Results for orders placed or performed during the hospital encounter of 03/19/19 (from the past 48 hour(s))  CBC with Differential     Status: Abnormal   Collection Time: 03/19/19  1:03 PM  Result Value Ref Range   WBC 8.3 4.0 - 10.5 K/uL   RBC 3.63 (L) 4.22 - 5.81 MIL/uL   Hemoglobin 10.4 (L) 13.0 - 17.0 g/dL   HCT 31.8 (L) 39.0 - 52.0 %   MCV 87.6 80.0 - 100.0 fL   MCH 28.7 26.0 - 34.0 pg   MCHC 32.7 30.0 - 36.0 g/dL   RDW 17.2 (H) 11.5 - 15.5 %    Platelets 130 (L) 150 - 400 K/uL   nRBC 0.0 0.0 - 0.2 %   Neutrophils Relative % 68 %   Neutro Abs 5.7 1.7 - 7.7 K/uL   Lymphocytes Relative 18 %   Lymphs Abs 1.5 0.7 - 4.0 K/uL   Monocytes Relative 10 %   Monocytes Absolute 0.9 0.1 - 1.0 K/uL   Eosinophils Relative 3 %   Eosinophils Absolute 0.2 0.0 - 0.5 K/uL   Basophils Relative 0 %   Basophils Absolute 0.0 0.0 - 0.1 K/uL   Immature Granulocytes 1 %   Abs Immature Granulocytes 0.06 0.00 - 0.07 K/uL    Comment: Performed at [facility], [address] [place], [place] [address]  Basic metabolic panel     Status: Abnormal   Collection Time: 03/19/19  1:03 PM  Result Value Ref Range   Sodium 135 135 - 145 mmol/L   Potassium 4.5 3.5 - 5.1 mmol/L   Chloride 102 98 - 111 mmol/L   CO2 22 22 - 32 mmol/L   Glucose, Bld 136 (H) 70 - 99 mg/dL   BUN 17 8 - 23 mg/dL   Creatinine, Ser 1.56 (H) 0.61 - 1.24 mg/dL   Calcium 8.9 8.9 - 10.3 mg/dL   GFR calc non Af Amer 47 (L) >60 mL/min   GFR calc Af Amer 55 (L) >60 mL/min   Anion gap 11 5 - 15    Comment: Performed at [facility] [address] [place], [place] [address]  CBG monitoring, ED     Status: Abnormal   Collection Time: 03/19/19  4:23 PM  Result Value Ref Range   Glucose-Capillary 117 (H) 70 - 99 mg/dL   Mr Lumbar Spine W Wo Contrast  Result Date: 03/19/2019 CLINICAL DATA:  Worsening back pain and lower extremity weakness. Urinary retention after spinal surgery few days ago. EXAM: MRI LUMBAR SPINE WITHOUT AND WITH CONTRAST TECHNIQUE: Multiplanar and multiecho pulse sequences of the lumbar spine were obtained without and with intravenous contrast. CONTRAST:  43m GADAVIST GADOBUTROL 1 MMOL/ML IV SOLN COMPARISON:  08/15/2018 FINDINGS: Segmentation:  Standard. Alignment:  Physiologic. Vertebrae:  No fracture, evidence of discitis, or bone lesion. Conus medullaris and cauda equina: Conus extends to the L1 level. Conus and cauda equina appear normal. Paraspinal and other  soft tissues: Postsurgical changes  in the posterior paraspinal soft tissues at L4-5. Disc levels: Disc spaces: Posterior lumbar interbody fusion and decompression from L4 through S1. T12-L1: No significant disc bulge. No evidence of neural foraminal stenosis. No central canal stenosis. L1-L2: No significant disc bulge. No evidence of neural foraminal stenosis. No central canal stenosis. L2-L3: Mild broad-based disc bulge. Mild bilateral facet arthropathy. No foraminal stenosis. No central canal stenosis. L3-L4: No significant disc bulge. Prominence of the epidural fat deforming the thecal sac as can be seen with epidural lipomatosis. No evidence of neural foraminal stenosis. L4-L5: Interbody fusion. Prominence of the epidural fat deforming the thecal sac consistent with epidural lipomatosis. In the posterior surgical bed at the laminectomy site there is a 4.2 x 2.4 x 2.5 cm fluid collection further deforming the thecal sac along the lateral aspects and dorsal aspect resulting in severe spinal stenosis and possible mass effect on the intraspinal L5 nerve roots. No evidence of neural foraminal stenosis. L5-S1: Interbody fusion. Prior laminectomy. Prominence of the epidural fat deforming the thecal sac as can be seen with epidural lipomatosis. No evidence of neural foraminal stenosis. No central canal stenosis. IMPRESSION: 1. At L4-S1 there is posterior lumbar interbody fusion and decompression. At L4-5 there is Prominence of the epidural fat deforming the thecal sac consistent with epidural lipomatosis. At L4-5, in the posterior surgical bed at the laminectomy site there is a 4.2 x 2.4 x 2.5 cm fluid collection further deforming the thecal sac along the lateral aspects and dorsal aspect resulting in severe spinal stenosis and possible mass effect on the intraspinal L5 nerve roots. 2. Epidural lipomatosis at L3-4 and L5-S1. 3. At L2-3 there is mild broad-based disc bulge. Mild bilateral facet arthropathy.  Electronically Signed   By: Kathreen Devoid   On: 03/19/2019 17:25   Dg Abd Acute W/chest  Result Date: 03/19/2019 CLINICAL DATA:  Status post fusion on Thursday. Rule out small bowel obstruction. EXAM: DG ABDOMEN ACUTE W/ 1V CHEST COMPARISON:  Chest x-ray dated 09/18/2017. Abdominal x-ray dated 09/09/2018. FINDINGS: Single-view of the chest: Stable cardiomegaly. Lungs are clear. No pleural effusion or pneumothorax is seen. Fusion rods within the thoracic spine appear stable in alignment. Supine and upright views of the abdomen: Bowel gas pattern is nonobstructive. No evidence of soft tissue mass or abnormal fluid collection. No evidence of free intraperitoneal air. No evidence of renal or ureteral calculi. Cholecystectomy clips in the RIGHT upper quadrant. PLIF hardware appears appropriately positioned within the lumbosacral spine. Ventriculoperitoneal shunt catheter terminates in the LEFT lower quadrant. IMPRESSION: 1. No active cardiopulmonary disease. No evidence of pneumonia or pulmonary edema. 2. Nonobstructive bowel gas pattern and no evidence of acute intra-abdominal abnormality. Electronically Signed   By: Franki Cabot M.D.   On: 03/19/2019 12:57    Pertinent items noted in HPI and remainder of comprehensive ROS otherwise negative.  Blood pressure (!) 144/88, pulse 97, temperature 99.3 F (37.4 C), resp. rate 14, height 5' 11"  (1.803 m), weight 122.5 kg, SpO2 96 %.  Patient is awake and alert.  He is oriented and appropriate.  He is obviously uncomfortable.  He does not appear to be toxic.  Examination of the head ears eyes nose and throat is unremarkable.  Neck is supple.  Pulses are normal bilaterally.  Airway is midline.  Chest and abdomen are benign.  Extremities are free from injury or deformity.  Neurologically he is awake and alert.  He is oriented and appropriate.  Speech is fluent.  Judgment insight are intact.  Cranial nerve function normal bilateral.  Motor examination of his upper  extremities 5/5 bilaterally.  Motor examination of his lower extremities 5/5 bilaterally.  Sensory examination with some patchy distal sensory loss in both distal lower extremities.  Good sacral sensation.  Normal rectal tone.  Wound clean and dry.  Assessment/Plan Patient with significant postoperative pain crisis with abnormal findings on his MRI scan.  Question whether this represents a compressive lesion versus simple postoperative edema.  Plan to admit for observation.  Treat with IV steroids and Toradol in hopes of controlling his pain.  Dr. Vertell Limber to evaluate in the morning.  Cooper Render Denelda Akerley 03/19/2019, 6:15 PM

## 2019-03-20 DIAGNOSIS — Z6839 Body mass index (BMI) 39.0-39.9, adult: Secondary | ICD-10-CM | POA: Diagnosis not present

## 2019-03-20 DIAGNOSIS — K7460 Unspecified cirrhosis of liver: Secondary | ICD-10-CM | POA: Diagnosis present

## 2019-03-20 DIAGNOSIS — D1779 Benign lipomatous neoplasm of other sites: Secondary | ICD-10-CM | POA: Diagnosis present

## 2019-03-20 DIAGNOSIS — G4700 Insomnia, unspecified: Secondary | ICD-10-CM | POA: Diagnosis present

## 2019-03-20 DIAGNOSIS — K219 Gastro-esophageal reflux disease without esophagitis: Secondary | ICD-10-CM | POA: Diagnosis present

## 2019-03-20 DIAGNOSIS — Z86010 Personal history of colonic polyps: Secondary | ICD-10-CM | POA: Diagnosis not present

## 2019-03-20 DIAGNOSIS — Z8249 Family history of ischemic heart disease and other diseases of the circulatory system: Secondary | ICD-10-CM | POA: Diagnosis not present

## 2019-03-20 DIAGNOSIS — I10 Essential (primary) hypertension: Secondary | ICD-10-CM | POA: Diagnosis present

## 2019-03-20 DIAGNOSIS — G8918 Other acute postprocedural pain: Secondary | ICD-10-CM | POA: Diagnosis present

## 2019-03-20 DIAGNOSIS — Z79891 Long term (current) use of opiate analgesic: Secondary | ICD-10-CM | POA: Diagnosis not present

## 2019-03-20 DIAGNOSIS — Z96642 Presence of left artificial hip joint: Secondary | ICD-10-CM | POA: Diagnosis present

## 2019-03-20 DIAGNOSIS — I2510 Atherosclerotic heart disease of native coronary artery without angina pectoris: Secondary | ICD-10-CM | POA: Diagnosis present

## 2019-03-20 DIAGNOSIS — Z882 Allergy status to sulfonamides status: Secondary | ICD-10-CM | POA: Diagnosis not present

## 2019-03-20 DIAGNOSIS — Z87442 Personal history of urinary calculi: Secondary | ICD-10-CM | POA: Diagnosis not present

## 2019-03-20 DIAGNOSIS — R339 Retention of urine, unspecified: Secondary | ICD-10-CM | POA: Diagnosis present

## 2019-03-20 DIAGNOSIS — Z91030 Bee allergy status: Secondary | ICD-10-CM | POA: Diagnosis not present

## 2019-03-20 DIAGNOSIS — Z20828 Contact with and (suspected) exposure to other viral communicable diseases: Secondary | ICD-10-CM | POA: Diagnosis present

## 2019-03-20 DIAGNOSIS — E662 Morbid (severe) obesity with alveolar hypoventilation: Secondary | ICD-10-CM | POA: Diagnosis present

## 2019-03-20 DIAGNOSIS — Z794 Long term (current) use of insulin: Secondary | ICD-10-CM | POA: Diagnosis not present

## 2019-03-20 DIAGNOSIS — E1140 Type 2 diabetes mellitus with diabetic neuropathy, unspecified: Secondary | ICD-10-CM | POA: Diagnosis present

## 2019-03-20 DIAGNOSIS — Z79899 Other long term (current) drug therapy: Secondary | ICD-10-CM | POA: Diagnosis not present

## 2019-03-20 DIAGNOSIS — F329 Major depressive disorder, single episode, unspecified: Secondary | ICD-10-CM | POA: Diagnosis present

## 2019-03-20 DIAGNOSIS — K7581 Nonalcoholic steatohepatitis (NASH): Secondary | ICD-10-CM | POA: Diagnosis present

## 2019-03-20 DIAGNOSIS — K5900 Constipation, unspecified: Secondary | ICD-10-CM | POA: Diagnosis present

## 2019-03-20 DIAGNOSIS — Z885 Allergy status to narcotic agent status: Secondary | ICD-10-CM | POA: Diagnosis not present

## 2019-03-20 LAB — SARS CORONAVIRUS 2 (TAT 6-24 HRS): SARS Coronavirus 2: NEGATIVE

## 2019-03-20 LAB — GLUCOSE, CAPILLARY
Glucose-Capillary: 96 mg/dL (ref 70–99)
Glucose-Capillary: 176 mg/dL — ABNORMAL HIGH (ref 70–99)
Glucose-Capillary: 253 mg/dL — ABNORMAL HIGH (ref 70–99)
Glucose-Capillary: 254 mg/dL — ABNORMAL HIGH (ref 70–99)
Glucose-Capillary: 250 mg/dL — ABNORMAL HIGH (ref 70–99)

## 2019-03-20 LAB — HIV ANTIBODY (ROUTINE TESTING W REFLEX): HIV Screen 4th Generation wRfx: NONREACTIVE

## 2019-03-20 MED ORDER — INSULIN ASPART PROT & ASPART (70-30 MIX) 100 UNIT/ML ~~LOC~~ SUSP
50.0000 [IU] | Freq: Every day | SUBCUTANEOUS | Status: DC
  Filled 2019-03-20: qty 10

## 2019-03-20 MED ORDER — PANTOPRAZOLE SODIUM 40 MG PO TBEC
40.0000 mg | DELAYED_RELEASE_TABLET | Freq: Every day | ORAL | Status: DC
  Administered 2019-03-20 – 2019-03-21 (×2): 40 mg via ORAL
  Filled 2019-03-20 (×2): qty 1

## 2019-03-20 MED ORDER — INSULIN ASPART 100 UNIT/ML ~~LOC~~ SOLN
0.0000 [IU] | Freq: Three times a day (TID) | SUBCUTANEOUS | Status: DC
  Administered 2019-03-20: 11 [IU] via SUBCUTANEOUS
  Administered 2019-03-20 – 2019-03-21 (×2): 7 [IU] via SUBCUTANEOUS

## 2019-03-20 MED ORDER — METHOCARBAMOL 500 MG PO TABS: 500.0000 mg | ORAL_TABLET | Freq: Four times a day (QID) | ORAL | Status: DC | PRN

## 2019-03-20 MED ORDER — SEMAGLUTIDE 14 MG PO TABS
14.0000 mg | ORAL_TABLET | Freq: Every day | ORAL | Status: DC
  Administered 2019-03-21: 07:00:00 14 mg via ORAL
  Filled 2019-03-20: qty 1

## 2019-03-20 MED ORDER — DULOXETINE HCL 60 MG PO CPEP
120.0000 mg | ORAL_CAPSULE | Freq: Every day | ORAL | Status: DC
  Administered 2019-03-20: 21:00:00 120 mg via ORAL
  Filled 2019-03-20: qty 2

## 2019-03-20 MED ORDER — ATORVASTATIN CALCIUM 10 MG PO TABS
10.0000 mg | ORAL_TABLET | Freq: Every day | ORAL | Status: DC
  Administered 2019-03-20: 21:00:00 10 mg via ORAL
  Filled 2019-03-20: qty 1

## 2019-03-20 MED ORDER — LEVETIRACETAM 750 MG PO TABS
750.0000 mg | ORAL_TABLET | Freq: Two times a day (BID) | ORAL | Status: DC
  Administered 2019-03-20 – 2019-03-21 (×2): 750 mg via ORAL
  Filled 2019-03-20 (×2): qty 1

## 2019-03-20 MED ORDER — CARVEDILOL 12.5 MG PO TABS
12.5000 mg | ORAL_TABLET | Freq: Two times a day (BID) | ORAL | Status: DC
  Administered 2019-03-20 – 2019-03-21 (×2): 12.5 mg via ORAL
  Filled 2019-03-20 (×2): qty 1

## 2019-03-20 MED ORDER — INSULIN ASPART PROT & ASPART (70-30 MIX) 100 UNIT/ML ~~LOC~~ SUSP: 40.0000 [IU] | Freq: Every day | SUBCUTANEOUS | Status: DC

## 2019-03-20 MED ORDER — GLIMEPIRIDE 1 MG PO TABS
1.0000 mg | ORAL_TABLET | Freq: Every day | ORAL | Status: DC
  Administered 2019-03-20: 21:00:00 1 mg via ORAL
  Filled 2019-03-20 (×2): qty 1

## 2019-03-20 MED ORDER — FENOFIBRATE 160 MG PO TABS
160.0000 mg | ORAL_TABLET | Freq: Every day | ORAL | Status: DC
  Administered 2019-03-20 – 2019-03-21 (×2): 160 mg via ORAL
  Filled 2019-03-20 (×2): qty 1

## 2019-03-20 MED ORDER — GABAPENTIN 600 MG PO TABS
600.0000 mg | ORAL_TABLET | Freq: Two times a day (BID) | ORAL | Status: DC
  Administered 2019-03-20 – 2019-03-21 (×2): 600 mg via ORAL
  Filled 2019-03-20 (×2): qty 1

## 2019-03-20 MED ORDER — INSULIN LISPRO PROT & LISPRO (75-25 MIX) 100 UNIT/ML KWIKPEN: 40.0000 [IU] | PEN_INJECTOR | SUBCUTANEOUS | Status: DC

## 2019-03-20 MED ORDER — FENOFIBRATE 54 MG PO TABS
134.0000 mg | ORAL_TABLET | Freq: Every day | ORAL | Status: DC
  Filled 2019-03-20: qty 1

## 2019-03-20 MED ORDER — METFORMIN HCL 500 MG PO TABS
500.0000 mg | ORAL_TABLET | Freq: Every day | ORAL | Status: DC
  Administered 2019-03-20: 500 mg via ORAL
  Filled 2019-03-20: qty 1

## 2019-03-20 MED ORDER — INSULIN ASPART PROT & ASPART (70-30 MIX) 100 UNIT/ML ~~LOC~~ SUSP: 65.0000 [IU] | Freq: Every day | SUBCUTANEOUS | Status: DC

## 2019-03-20 MED ORDER — IRON 325 (65 FE) MG PO TABS: 1.0000 | ORAL_TABLET | Freq: Two times a day (BID) | ORAL | Status: DC

## 2019-03-20 NOTE — Significant Event (Addendum)
Rapid Response Event Note  Overview: Panic Attack   Received a call from nursing staff about patient having what appears to be a panic attack, nursing staff reached out the [facility] surgery and have not received any orders. I was with another patient in an emergency and informed the nursing staff to reach out to [facility] again for guidance and orders. Per nurse, patient was diaphoretic, hypertensive, and anxious but not in pain.   Nurse mentioned that the MEWS was now Yellow - 2 and she also called to notify me, per the guideline, is it not mandatory that RR be called Yellow MEWS.   I will try to come see the patient as soon I as I can. I called back from an update at 1648, per nursing, NSU NP to place orders for home meds, patient was better per staff. I did not see the patient.   PLAN: -- RN to page MD and seek guidance.  James [name] R

## 2019-03-20 NOTE — Progress Notes (Signed)
Stable overnight.  Patient still with significant back pain with some radiation to his lower extremities.  Patient states symptoms are similar to where they were last night.  He is afebrile.  His vital signs are stable.  He is awake and alert.  His motor and sensory function are stable.  Wound is clean and dry.  Overall stable.  Continue IV steroids and medications for pain control.  I discussed situation with Dr. Vertell Limber.  He is out of the office till Thursday so I will assume care.  He would like to try to treat this nonoperatively if possible.

## 2019-03-20 NOTE — Progress Notes (Signed)
Pt endorses that Oxycodone makes him hallucinate. Dr. Ellene Route informed at 2226.

## 2019-03-20 NOTE — Progress Notes (Signed)
Pt has a change in condition. Pt up standing in hall complaining of feeling extremely hot, with perfuse sweating. BP (!) 171/92   Pulse 88   Temp 98.3 F (36.8 C) (Oral)   Resp (!) 32   Ht 5' 11"  (1.803 m)   Wt 127.1 kg   SpO2 98%   BMI 39.08 kg/m  EKG taken and placed in pt chart for MD review. MD notified of patient change in condition. Will continue to monitor.   Janett Billow, RN

## 2019-03-20 NOTE — Progress Notes (Signed)
Patient's wife came to the nursing station to notify the RN that the patient was complaining of his heart racing and that he states that he is having trouble swallowing and it feelsfeels numb in his throat.

## 2019-03-20 NOTE — Progress Notes (Signed)
Pt walked in the hallway with back brace in place using the front wheel walker. Patient has no complaints of pain and tolerated the walk well.

## 2019-03-20 NOTE — Progress Notes (Signed)
Pt's blood pressure is currently elevated (174/97) . RN called MD office and spoke with [facility] about the provider re-starting Pt's blood pressure medicine and daily meds. Awaiting orders from MD.

## 2019-03-20 NOTE — Plan of Care (Signed)

## 2019-03-20 NOTE — Progress Notes (Signed)
RN called MD stern office again and spoke to [place]. Pt's wife is at the bedside and would like pt's home medications re-started. Deidre Ala stated she would pass the message to the appropriate doctor. Will await orders.

## 2019-03-20 NOTE — Progress Notes (Signed)
Pt having another episode of of perfuse sweating and nausea. MD from neurosurgery returned the call and gave the order to give Zofran for his nausea. Pt BP elevated.  Asked MD about restarting pt normal medication regimen. MD stated he did not want to  restart any home medications at this time. MD made aware of current elevated BP.

## 2019-03-21 ENCOUNTER — Other Ambulatory Visit: Payer: Self-pay | Admitting: Endocrinology

## 2019-03-21 LAB — GLUCOSE, CAPILLARY: Glucose-Capillary: 222 mg/dL — ABNORMAL HIGH (ref 70–99)

## 2019-03-21 MED ORDER — METHYLPREDNISOLONE 4 MG PO TBPK: ORAL_TABLET | ORAL | 0 refills | Status: DC

## 2019-03-21 MED FILL — GLIMEPIRIDE 1 MG TABLET: 1 | 30 days supply | Qty: 30 | Fill #0

## 2019-03-21 MED FILL — DULOXETINE HCL 60 MG CPEP: 60 | 30 days supply | Qty: 60 | Fill #2

## 2019-03-21 MED FILL — METHYLPREDNISOLONE 4 MG TBP: 4 | 6 days supply | Qty: 21 | Fill #0

## 2019-03-21 MED FILL — FREESTYLE LITE TEST STRIP: 25 days supply | Qty: 100 | Fill #1

## 2019-03-21 MED FILL — Sodium Chloride IV Soln 0.9%: INTRAVENOUS | Qty: 1000 | Status: AC

## 2019-03-21 MED FILL — Heparin Sodium (Porcine) Inj 1000 Unit/ML: INTRAMUSCULAR | Qty: 30 | Status: AC

## 2019-03-21 NOTE — TOC Transition Note (Signed)
Transition of Care [facility]) - CM/SW Discharge Note   Patient Details  Name: James [name] MRN: [id number] Date of Birth: [birth year]  [facility] CM/SW Contact:  Pollie Friar, RN Phone Number: 03/21/2019, 10:25 AM   Clinical Narrative:    Pt is discharging home with self care. Pt has hospital f/u, insurance and transportation home.    Final next level of care: Home/Self Care Barriers to Discharge: No Barriers Identified   Patient Goals and CMS Choice        Discharge Placement                       Discharge Plan and Services                                     Social Determinants of Health (SDOH) Interventions     Readmission Risk Interventions No flowsheet data found.

## 2019-03-21 NOTE — Consult Note (Signed)
   [facility] Inpatient Consult   03/21/2019  James [name] Mar 01, 1956 [id number]  [facility] Status: pending  [place] Coordinator aware of re-hospitalization and will follow for post hospital needs.  For questions, please contact: Natividad Brood, RN BSN [facility] Liaison  [phone] business mobile phone Toll free office [phone]  Fax number: [phone] [name]

## 2019-03-21 NOTE — Progress Notes (Signed)
NURSING PROGRESS NOTE  James [name] [id number] Discharge Data: 03/21/2019 11:06 AM Attending Provider: Earnie Larsson, MD PCP:[name], James Berthold, MD     James [name] to be D/C'd Home per MD order.  Discussed with the patient the After Visit Summary and all questions fully answered. All IV's discontinued with no bleeding noted. All belongings returned to patient for patient to take home.   Last Vital Signs:  Blood pressure (!) 151/87, pulse 86, temperature 98.7 F (37.1 C), temperature source Oral, resp. rate (!) 21, height _0  (1.803 m), weight 127.1 kg, SpO2 99 %.  Discharge Medication List Allergies as of 03/21/2019      Reactions   Bee Venom Anaphylaxis   Hydrocodone-homatropine Other (See Comments)   Depressed feeling   Morphine And Related Other (See Comments)   Hallucinations, back in the 80s. States has taken vicodin before w/o problems    Sulfa Drugs Cross Reactors Rash      Medication List    TAKE these medications   atorvastatin 10 MG tablet Commonly known as: LIPITOR TAKE 1 TABLET (10 MG TOTAL) BY MOUTH DAILY. What changed: when to take this Notes to patient: 03/21/2019   carvedilol 12.5 MG tablet Commonly known as: COREG Take 1 tablet (12.5 mg total) by mouth 2 (two) times daily with a meal. Notes to patient: 03/21/2019   DULoxetine 60 MG capsule Commonly known as: CYMBALTA Take 2 capsules (120 mg total) by mouth daily. What changed: when to take this Notes to patient: 03/21/2019   EPINEPHrine 0.3 mg/0.3 mL Soaj injection Commonly known as: EpiPen 2-Pak Inject 0.3 mLs (0.3 mg total) into the muscle once as needed for up to 1 dose. What changed: reasons to take this   fenofibrate micronized 134 MG capsule Commonly known as: LOFIBRA Take 1 tablet by mouth three times weekly. What changed:   how much to take  how to take this  when to take this  additional instructions Notes to patient: 03/21/2019   FreeStyle Freedom Lite w/Device Kit Use to check  blood sugars 3 times daily. Dx code: E11.9 Notes to patient: 03/21/2019   freestyle lancets Use to check blood sugar 3 times daily. Dx code E11.9 Notes to patient: 03/21/2019   gabapentin 600 MG tablet Commonly known as: NEURONTIN Take 1 tablet (600 mg total) by mouth 2 (two) times daily. Notes to patient: 03/21/2019   Galcanezumab-gnlm 120 MG/ML Soaj Commonly known as: Emgality Inject 1 pen into the skin every 28 (twenty-eight) days. What changed: how much to take Notes to patient: Continue home  schedule    glimepiride 1 MG tablet Commonly known as: AMARYL TAKE 1 TABLET (1 MG TOTAL) BY MOUTH DAILY BEFORE SUPPER. What changed: when to take this Notes to patient: 03/21/2019   glucose blood test strip Use Freestyle Freedom Lite test strips as instructed to check blood sugar 4 times daily. Notes to patient: 03/21/2019   HYDROcodone-acetaminophen 5-325 MG tablet Commonly known as: NORCO/VICODIN Take 1-2 tablets by mouth every 6 (six) hours as needed for severe pain ((score 7 to 10)).   hydrocortisone 25 MG suppository Commonly known as: ANUSOL-HC Place 1 suppository (25 mg total) rectally 2 (two) times daily as needed for hemorrhoids.   Insulin Lispro Prot & Lispro (75-25) 100 UNIT/ML Kwikpen Commonly known as: HumaLOG Mix 75/25 KwikPen Inject 50 units under the skin at breakfast, 40 at lunch, and 65 at dinner. What changed:   how much to take  how to take this  when  to take this  additional instructions Notes to patient: Continue hoe schedule    Insulin Pen Needle 31G X 5 MM Misc Use for insulin pen twice a day Notes to patient: 03/21/2019   Iron 325 (65 Fe) MG Tabs Take 1 tablet (325 mg total) by mouth 2 (two) times daily. Take on a empty stomach Notes to patient: 03/21/2019   levETIRAcetam 750 MG tablet Commonly known as: KEPPRA TAKE 1 TABLET (750 MG TOTAL) BY MOUTH 2 (TWO) TIMES DAILY. Notes to patient: 03/21/2019   metFORMIN 500 MG tablet Commonly known as:  GLUCOPHAGE TAKE 1 TABLET BY MOUTH DAILY WITH A MEAL What changed:   how much to take  how to take this  when to take this  additional instructions Notes to patient: 03/21/2019   methocarbamol 500 MG tablet Commonly known as: ROBAXIN Take 1 tablet (500 mg total) by mouth every 6 (six) hours as needed for muscle spasms.   methylPREDNISolone 4 MG Tbpk tablet Commonly known as: MEDROL DOSEPAK follow package directions Notes to patient: 03/21/2019   omeprazole 40 MG capsule Commonly known as: PRILOSEC Take 1 capsule (40 mg total) by mouth 2 (two) times daily before a meal. Notes to patient: 03/21/2019   Rybelsus 14 MG Tabs Generic drug: Semaglutide Take 1 tablet by mouth daily. Take 1 tablet by mouth once daily. What changed:   how much to take  when to take this  additional instructions Notes to patient: 03/22/2019

## 2019-03-21 NOTE — Discharge Summary (Signed)
Physician Discharge Summary  Patient ID: James [name] MRN: [id number] DOB/AGE: 12-01-1955 63 y.o.  Admit date: 03/19/2019 Discharge date: 03/21/2019  Admission Diagnoses:  Discharge Diagnoses:  Active Problems:   Post-op pain   Discharged Condition: good  Hospital Course: Patient of Dr. Julaine Fusi readmitted for postoperative pain crisis.  Patient with severe back and bilateral lower extremity pain approximately 4 days status post decompression and fusion surgery at L4-5 and L5S1.  Postoperative MRI scan demonstrated evidence of significant fluid and possibly some hemorrhage in the operative bed with some narrowing of the thecal sac.  Patient with good motor strength and normal bowel and bladder function.  He was admitted for pain control.  He was placed on IV steroids and his pain has dramatically lessened over the past 48 hours.  Currently he is comfortable.  He is standing and walking without difficulty.  He is voiding.  He feels comfortable with discharge home.  Consults:   Significant Diagnostic Studies:   Treatments:   Discharge Exam: Blood pressure (!) 147/91, pulse 86, temperature (!) 97.5 F (36.4 C), temperature source Oral, resp. rate 18, height 5' 11"  (1.803 m), weight 127.1 kg, SpO2 97 %. Awake and alert.  Oriented and appropriate.  Speech is fluent.  Judgment insight are intact.  Cranial nerve function normal bilaterally.  Motor and sensory function extremities intact.  Wound clean and dry.  Chest and abdomen benign.  Disposition: Discharge disposition: 01-Home or Self Care        Allergies as of 03/21/2019      Reactions   Bee Venom Anaphylaxis   Hydrocodone-homatropine Other (See Comments)   Depressed feeling   Morphine And Related Other (See Comments)   Hallucinations, back in the 80s. States has taken vicodin before w/o problems    Sulfa Drugs Cross Reactors Rash      Medication List    TAKE these medications   atorvastatin 10 MG tablet Commonly known  as: LIPITOR TAKE 1 TABLET (10 MG TOTAL) BY MOUTH DAILY. What changed: when to take this   carvedilol 12.5 MG tablet Commonly known as: COREG Take 1 tablet (12.5 mg total) by mouth 2 (two) times daily with a meal.   DULoxetine 60 MG capsule Commonly known as: CYMBALTA Take 2 capsules (120 mg total) by mouth daily. What changed: when to take this   EPINEPHrine 0.3 mg/0.3 mL Soaj injection Commonly known as: EpiPen 2-Pak Inject 0.3 mLs (0.3 mg total) into the muscle once as needed for up to 1 dose. What changed: reasons to take this   fenofibrate micronized 134 MG capsule Commonly known as: LOFIBRA Take 1 tablet by mouth three times weekly. What changed:   how much to take  how to take this  when to take this  additional instructions   FreeStyle Freedom Lite w/Device Kit Use to check blood sugars 3 times daily. Dx code: E11.9   freestyle lancets Use to check blood sugar 3 times daily. Dx code E11.9   gabapentin 600 MG tablet Commonly known as: NEURONTIN Take 1 tablet (600 mg total) by mouth 2 (two) times daily.   Galcanezumab-gnlm 120 MG/ML Soaj Commonly known as: Emgality Inject 1 pen into the skin every 28 (twenty-eight) days. What changed: how much to take   glimepiride 1 MG tablet Commonly known as: AMARYL TAKE 1 TABLET (1 MG TOTAL) BY MOUTH DAILY BEFORE SUPPER. What changed: when to take this   glucose blood test strip Use Freestyle Freedom Lite test strips as instructed to  check blood sugar 4 times daily.   HYDROcodone-acetaminophen 5-325 MG tablet Commonly known as: NORCO/VICODIN Take 1-2 tablets by mouth every 6 (six) hours as needed for severe pain ((score 7 to 10)).   hydrocortisone 25 MG suppository Commonly known as: ANUSOL-HC Place 1 suppository (25 mg total) rectally 2 (two) times daily as needed for hemorrhoids.   Insulin Lispro Prot & Lispro (75-25) 100 UNIT/ML Kwikpen Commonly known as: HumaLOG Mix 75/25 KwikPen Inject 50 units under the  skin at breakfast, 40 at lunch, and 65 at dinner. What changed:   how much to take  how to take this  when to take this  additional instructions   Insulin Pen Needle 31G X 5 MM Misc Use for insulin pen twice a day   Iron 325 (65 Fe) MG Tabs Take 1 tablet (325 mg total) by mouth 2 (two) times daily. Take on a empty stomach   levETIRAcetam 750 MG tablet Commonly known as: KEPPRA TAKE 1 TABLET (750 MG TOTAL) BY MOUTH 2 (TWO) TIMES DAILY.   metFORMIN 500 MG tablet Commonly known as: GLUCOPHAGE TAKE 1 TABLET BY MOUTH DAILY WITH A MEAL What changed:   how much to take  how to take this  when to take this  additional instructions   methocarbamol 500 MG tablet Commonly known as: ROBAXIN Take 1 tablet (500 mg total) by mouth every 6 (six) hours as needed for muscle spasms.   methylPREDNISolone 4 MG Tbpk tablet Commonly known as: MEDROL DOSEPAK follow package directions   omeprazole 40 MG capsule Commonly known as: PRILOSEC Take 1 capsule (40 mg total) by mouth 2 (two) times daily before a meal.   Rybelsus 14 MG Tabs Generic drug: Semaglutide Take 1 tablet by mouth daily. Take 1 tablet by mouth once daily. What changed:   how much to take  when to take this  additional instructions        Signed: Charlie Pitter 03/21/2019, 8:31 AM

## 2019-03-22 ENCOUNTER — Other Ambulatory Visit: Payer: Self-pay

## 2019-03-22 MED ORDER — INSULIN PEN NEEDLE 31G X 5 MM MISC: 1 refills | Status: DC

## 2019-03-22 MED FILL — BD PEN NDL MINI 31GX5MM: 31G X 5 MM | 30 days supply | Qty: 100 | Fill #0

## 2019-03-23 ENCOUNTER — Encounter: Payer: Self-pay | Admitting: *Deleted

## 2019-03-23 ENCOUNTER — Other Ambulatory Visit: Payer: Self-pay | Admitting: *Deleted

## 2019-03-23 NOTE — Patient Outreach (Signed)
James [name]) Care Management  03/23/2019  James [name] 1955/08/16 [id number]   Transition of care call/case closure   Referral received: 03/17/19 Initial outreach: 03/23/19 Insurance: [employer] Health Save Plan   Subjective: This patient well known to this RNCM as he was a [facility] patient of this RNCM for many years. Initial successful telephone call to patient's home number in order to complete transition of care assessment; 2 HIPAA identifiers verified. Explained purpose of call and completed transition of care assessment.  James [name] states he is doing much better, denies post-operative problems, says surgical incisions are unremarkable, states surgical pain well managed with tylenol. He says oxycodone makes him "crazy in the head" , tolerating diet, denies bowel or bladder problems.  Spouse, James [name], is assisting with his recovery.  James [name] says he has not checked his blood sugar today but the numbers have 'been all over the place" due to his surgery with post operative complications  and the need for steroids added to his treatment plan. He says he has an appointment with James [name] (endocrinologist) on 9/29 that he will likely change to a telehealth visit.  James [name] gave permission for this RNCM to speak with James [name] and she asked this RNCM to review chart to determine what medications may have contributed to [facility] hallucinations on 9/20 and 9/21. James [name] expressed disappointment and frustration that [facility] home medications were not given to him upon readmission to the hospital, despite her numerous requests,since he is on two medications that should not be discontinued abruptly, Keppra and Cymbalta.  James [name] states he is an active member of [employer]  chronic disease management program. He says he uses a [facility] outpatient pharmacy.    Objective:  James [name] was hospitalized at [facility] from 9/17-9/18/2020 for surgery:Lumbar four-five Lumbar  five Sacral one Posterior lumbar interbody fusion with PEEK cages, autograft, pedicle screw fixation and posterolateral arthrodesis with autograft. He was readmitted to [facility] on 9/20- 9/22 for treatment of severe post operative pain related to significant fluid and possibly some hemorrhage in the operative bed with some narrowing of the thecal sac  Comorbidities include: CAD, Type 2 DM, chronic migraine, HTN, OSA, cirrhosis, GERD,  Stomach ulcer, idiopathic intracranial HTN, obstructive hydrocephalus s/p shunt placement, psoriatic arthritis, eczema, morbid obesity, testosterone deficiency He was discharged to home on 03/21/19 without the need for home health services or DME.   Assessment:  Patient voices good understanding of all discharge instructions.  See transition of care flowsheet for assessment details.   Plan:  After extensive chart review, per James [name]'s request, of medications given to James [name]  to determine possible causes of James [name]'s hallucinations and confusion on 9/20 and 9/21, intolerance to Oxycodone and Toradol noted under patient's allergies with side effect of hallucinations and confusion and delirium. Encouraged James [name] to contact [employer] of Patient Experience or to comment on the hospital discharge survey James [name] should receive regarding her concerns with his care issues during his recent hospitalizations. James [name] voiced understanding and compliance. Reviewed hospital discharge diagnosis of lumbar surgery and readmission to treat severe post operative pain and discharge treatment plan using hospital discharge instructions, assessing medication adherence, reviewing problems requiring provider notification, and discussing the importance of follow up with surgeon and specialists as directed. Reviewed [place]'s announcements that all [place] members will receive the Healthy Lifestyle Premium rate in 2021.  Using [place] website,  verified that patient is an active participate in  [place]'s [employer] chronic disease management program.   No ongoing care management needs identified so will close case to [facility] Management services and route successful outreach letter with [facility] pamphlet and 24 Hour Nurse Line Magnet to [place] Management clinical pool to be mailed to patient's home address.   Barrington Ellison RN,CCM,CDE [place] Management Coordinator Office Phone [phone] Office Fax [phone]

## 2019-03-28 ENCOUNTER — Encounter: Payer: Self-pay | Admitting: Endocrinology

## 2019-03-28 ENCOUNTER — Ambulatory Visit (INDEPENDENT_AMBULATORY_CARE_PROVIDER_SITE_OTHER): Payer: 59 | Admitting: Endocrinology

## 2019-03-28 DIAGNOSIS — I10 Essential (primary) hypertension: Secondary | ICD-10-CM

## 2019-03-28 DIAGNOSIS — E1165 Type 2 diabetes mellitus with hyperglycemia: Secondary | ICD-10-CM | POA: Diagnosis not present

## 2019-03-28 DIAGNOSIS — I2510 Atherosclerotic heart disease of native coronary artery without angina pectoris: Secondary | ICD-10-CM

## 2019-03-28 DIAGNOSIS — E782 Mixed hyperlipidemia: Secondary | ICD-10-CM | POA: Diagnosis not present

## 2019-03-28 MED ORDER — GLIMEPIRIDE 1 MG PO TABS: 1.0000 mg | ORAL_TABLET | Freq: Two times a day (BID) | ORAL | 2 refills | Status: DC

## 2019-03-28 NOTE — Progress Notes (Signed)
Patient ID: James [name], male   DOB: 03/01/1956, 62 y.o.   MRN: [id number]           Reason for Appointment: Follow-up for Type 2 Diabetes  Today's office visit was provided via telemedicine using a telephone call to the patient Patient has been explained the limitations of evaluation and management by telemedicine and the availability of in person appointments.  The patient understood the limitations and agreed to proceed. Patient also understood that the telehealth visit is billable. . Location of the patient: Home . Location of the provider: Office Only the patient and myself were participating in the encounter   History of Present Illness:          Date of diagnosis of type 2 diabetes mellitus:  ?  2014      Background history:  He is not clear how his diabetes was diagnosed, likely on routine testing. Initially had been treated with metformin and also tried on Amaryl Patient had progressive increase in his blood sugars since 1/17 with stopping metformin and being on the regimen of Amaryl and Januvia, he thinks his blood sugars went up to 601.  He was then started on basal bolus insulin   Recent history:   INSULIN regimen: Humalog mix 75/25, 60 units in the morning, 40 at lunch and 50 before dinner 40 units in the morning, 35 at lunch and 60 at dinnertime.  Non-insulin hypoglycemic drugs the patient is taking are: Rybelsus 14 mg daily, Amaryl 43m bedtime, metformin 500 mg daily  His A1c is 9.2 compared to 11% Lowest A1c was 6.8  Current management, blood sugar patterns and problems identified:  He had been on increasing doses of insulin but he had called in late August to report lower blood sugars  His insulin doses were reduced but appears that he needed further reduction of his insulin during his hospitalization for back surgery and since his last admission he has not taken any insulin  Also Rybelsus was increased to 40 mg  His blood sugars have been checked before  meals and not after  Highest blood sugars are usually at lunchtime  Today glucose was 202 but he drank a regular soft and  Also he is trying to avoid a lot of bread he is probably getting high fat meats at breakfast  FASTING blood sugars are slightly higher on Sunday and Monday but he has been on a Medrol Dosepak from his back surgeon recently for back pain  Was started on additional lunchtime Humalog mix insulin on his last visit about 2 weeks ago  He is here for short-term follow-up since he wants to get clearance for his spine surgery  His pain level is improving, previously may have been getting more pain because of this  Side  effects from medications have been: None  Compliance with the medical regimen: Fair  Glucose monitoring:  done up to 3 times a day         Glucometer:  Accu-Chek Blood Glucose readings from patient recall   PRE-MEAL Fasting Lunch Dinner Bedtime Overall  Glucose range:  85-151  118-234  116-185    Mean/median:     ?   Previous readings:  PRE-MEAL Fasting Lunch Dinner Bedtime Overall  Glucose range:  130-278  73-214  88-222  199-212   Mean/median:     ?      Self-care:   Meal times are:  Breakfast variable, lunch: 2-3 PM Dinner: 5-6 PM    Dietician  visit, most recent:09/2015               CDE consultation: 12/2018  Weight history:  Wt Readings from Last 3 Encounters:  03/20/19 280 lb 3.3 oz (127.1 kg)  03/08/19 270 lb 1.6 oz (122.5 kg)  03/01/19 273 lb (123.8 kg)    Glycemic control:   Lab Results  Component Value Date   HGBA1C 9.2 (H) 03/08/2019   HGBA1C 11.0 (A) 12/28/2018   HGBA1C 6.8 (H) 05/03/2018   Lab Results  Component Value Date   MICROALBUR 3.4 (H) 12/28/2018   LDLCALC 137 (H) 09/07/2016   CREATININE 1.56 (H) 03/19/2019    No visits with results within 1 Week(s) from this visit.  Latest known visit with results is:  Admission on 03/19/2019, Discharged on 03/21/2019  Component Date Value Ref Range Status  . WBC  03/19/2019 8.3  4.0 - 10.5 K/uL Final  . RBC 03/19/2019 3.63* 4.22 - 5.81 MIL/uL Final  . Hemoglobin 03/19/2019 10.4* 13.0 - 17.0 g/dL Final  . HCT 03/19/2019 31.8* 39.0 - 52.0 % Final  . MCV 03/19/2019 87.6  80.0 - 100.0 fL Final  . MCH 03/19/2019 28.7  26.0 - 34.0 pg Final  . MCHC 03/19/2019 32.7  30.0 - 36.0 g/dL Final  . RDW 03/19/2019 17.2* 11.5 - 15.5 % Final  . Platelets 03/19/2019 130* 150 - 400 K/uL Final  . nRBC 03/19/2019 0.0  0.0 - 0.2 % Final  . Neutrophils Relative % 03/19/2019 68  % Final  . Neutro Abs 03/19/2019 5.7  1.7 - 7.7 K/uL Final  . Lymphocytes Relative 03/19/2019 18  % Final  . Lymphs Abs 03/19/2019 1.5  0.7 - 4.0 K/uL Final  . Monocytes Relative 03/19/2019 10  % Final  . Monocytes Absolute 03/19/2019 0.9  0.1 - 1.0 K/uL Final  . Eosinophils Relative 03/19/2019 3  % Final  . Eosinophils Absolute 03/19/2019 0.2  0.0 - 0.5 K/uL Final  . Basophils Relative 03/19/2019 0  % Final  . Basophils Absolute 03/19/2019 0.0  0.0 - 0.1 K/uL Final  . Immature Granulocytes 03/19/2019 1  % Final  . Abs Immature Granulocytes 03/19/2019 0.06  0.00 - 0.07 K/uL Final   Performed at [facility], [place] [address] [place], [place] [address]  . Sodium 03/19/2019 135  135 - 145 mmol/L Final  . Potassium 03/19/2019 4.5  3.5 - 5.1 mmol/L Final  . Chloride 03/19/2019 102  98 - 111 mmol/L Final  . CO2 03/19/2019 22  22 - 32 mmol/L Final  . Glucose, Bld 03/19/2019 136* 70 - 99 mg/dL Final  . BUN 03/19/2019 17  8 - 23 mg/dL Final  . Creatinine, Ser 03/19/2019 1.56* 0.61 - 1.24 mg/dL Final  . Calcium 03/19/2019 8.9  8.9 - 10.3 mg/dL Final  . GFR calc non Af Amer 03/19/2019 47* >60 mL/min Final  . GFR calc Af Amer 03/19/2019 55* >60 mL/min Final  . Anion gap 03/19/2019 11  5 - 15 Final   Performed at [facility], [place] [address] [place], [place] [address]  . Glucose-Capillary 03/19/2019 117* 70 - 99 mg/dL Final  . SARS Coronavirus 2 03/19/2019 NEGATIVE  NEGATIVE Final    Comment: (NOTE) SARS-CoV-2 target nucleic acids are NOT DETECTED. The SARS-CoV-2 RNA is generally detectable in upper and lower respiratory specimens during the acute phase of infection. Negative results do not preclude SARS-CoV-2 infection, do not rule out co-infections with other pathogens, and should not be used as  the sole basis for treatment or other patient management decisions. Negative results must be combined with clinical observations, patient history, and epidemiological information. The expected result is Negative. Fact Sheet for Patients: SugarRoll.be Fact Sheet for Healthcare Providers: https://www.woods-mathews.com/ This test is not yet approved or cleared by the [place] FDA and  has been authorized for detection and/or diagnosis of SARS-CoV-2 by FDA under an Emergency Use Authorization (EUA). This EUA will remain  in effect (meaning this test can be used) for the duration of the COVID-19 declaration under Section 56                          4(b)(1) of the Act, 21 U.S.C. section 360bbb-3(b)(1), unless the authorization is terminated or revoked sooner. Performed at [facility], [place] [address] [place], [place] [address]   . HIV Screen 4th Generation wRfx 03/20/2019 Non Reactive  Non Reactive Final   Comment: (NOTE) Performed At: [facility] [place], [place] [id number] Rush Farmer MD SR:[id number]   . Glucose-Capillary 03/20/2019 96  70 - 99 mg/dL Final  . Comment 1 03/20/2019 Document in Chart   Final  . Glucose-Capillary 03/20/2019 176* 70 - 99 mg/dL Final  . Glucose-Capillary 03/20/2019 253* 70 - 99 mg/dL Final  . Glucose-Capillary 03/20/2019 254* 70 - 99 mg/dL Final  . Comment 1 03/20/2019 Notify RN   Final  . Comment 2 03/20/2019 Document in Chart   Final  . Glucose-Capillary 03/20/2019 250* 70 - 99 mg/dL Final  . Comment 1 03/20/2019 Notify RN   Final  . Comment 2 03/20/2019  Document in Chart   Final  . Glucose-Capillary 03/21/2019 222* 70 - 99 mg/dL Final  . Comment 1 03/21/2019 Notify RN   Final  . Comment 2 03/21/2019 Document in Chart   Final       Allergies as of 03/28/2019      Reactions   Bee Venom Anaphylaxis   Hydrocodone-homatropine Other (See Comments)   Hallucinations, confusion, delirium Depressed feeling   Toradol [ketorolac Tromethamine] Other (See Comments)   Hallucinations, confusion, delirium   Morphine And Related Other (See Comments)   Hallucinations, back in the 80s. States has taken vicodin before w/o problems    Sulfa Drugs Cross Reactors Rash      Medication List       Accurate as of March 28, 2019  8:34 PM. If you have any questions, ask your nurse or doctor.        STOP taking these medications   Insulin Lispro Prot & Lispro (75-25) 100 UNIT/ML Kwikpen Commonly known as: HumaLOG Mix 75/25 KwikPen     TAKE these medications   atorvastatin 10 MG tablet Commonly known as: LIPITOR TAKE 1 TABLET (10 MG TOTAL) BY MOUTH DAILY. What changed: when to take this   carvedilol 12.5 MG tablet Commonly known as: COREG Take 1 tablet (12.5 mg total) by mouth 2 (two) times daily with a meal.   DULoxetine 60 MG capsule Commonly known as: CYMBALTA Take 2 capsules (120 mg total) by mouth daily. What changed: when to take this   EPINEPHrine 0.3 mg/0.3 mL Soaj injection Commonly known as: EpiPen 2-Pak Inject 0.3 mLs (0.3 mg total) into the muscle once as needed for up to 1 dose. What changed: reasons to take this   fenofibrate micronized 134 MG capsule Commonly known as: LOFIBRA Take 1 tablet by mouth three times weekly. What changed:   how much  to take  how to take this  when to take this  additional instructions   FreeStyle Freedom Lite w/Device Kit Use to check blood sugars 3 times daily. Dx code: E11.9   freestyle lancets Use to check blood sugar 3 times daily. Dx code E11.9   gabapentin 600 MG tablet  Commonly known as: NEURONTIN Take 1 tablet (600 mg total) by mouth 2 (two) times daily.   Galcanezumab-gnlm 120 MG/ML Soaj Commonly known as: Emgality Inject 1 pen into the skin every 28 (twenty-eight) days. What changed: how much to take   glimepiride 1 MG tablet Commonly known as: AMARYL Take 1 tablet (1 mg total) by mouth 2 (two) times daily. What changed: when to take this   glucose blood test strip Use Freestyle Freedom Lite test strips as instructed to check blood sugar 4 times daily.   hydrocortisone 25 MG suppository Commonly known as: ANUSOL-HC Place 1 suppository (25 mg total) rectally 2 (two) times daily as needed for hemorrhoids.   Insulin Pen Needle 31G X 5 MM Misc Use for insulin pen three times a day   Iron 325 (65 Fe) MG Tabs Take 1 tablet (325 mg total) by mouth 2 (two) times daily. Take on a empty stomach   levETIRAcetam 750 MG tablet Commonly known as: KEPPRA TAKE 1 TABLET (750 MG TOTAL) BY MOUTH 2 (TWO) TIMES DAILY.   metFORMIN 500 MG tablet Commonly known as: GLUCOPHAGE TAKE 1 TABLET BY MOUTH DAILY WITH A MEAL What changed:   how much to take  how to take this  when to take this  additional instructions   methocarbamol 500 MG tablet Commonly known as: ROBAXIN Take 1 tablet (500 mg total) by mouth every 6 (six) hours as needed for muscle spasms.   methylPREDNISolone 4 MG Tbpk tablet Commonly known as: MEDROL DOSEPAK follow package directions   omeprazole 40 MG capsule Commonly known as: PRILOSEC Take 1 capsule (40 mg total) by mouth 2 (two) times daily before a meal.   Rybelsus 14 MG Tabs Generic drug: Semaglutide Take 1 tablet by mouth daily. Take 1 tablet by mouth once daily. What changed:   how much to take  when to take this  additional instructions       Allergies:  Allergies  Allergen Reactions  . Bee Venom Anaphylaxis  . Hydrocodone-Homatropine Other (See Comments)    Hallucinations, confusion, delirium Depressed  feeling  . Toradol [Ketorolac Tromethamine] Other (See Comments)    Hallucinations, confusion, delirium  . Morphine And Related Other (See Comments)    Hallucinations, back in the 80s. States has taken vicodin before w/o problems   . Sulfa Drugs Cross Reactors Rash    Past Medical History:  Diagnosis Date  . Acid reflux   . Arthritis   . Chronic headaches    on cymbalta  . Cirrhosis ([facility])   . Colon polyps   . Complication of anesthesia    problems waking up from anesthesia  . Depression    on cymbalta  . Diabetes mellitus with neuropathy ([facility])   . Diverticulitis 03/2013  . Eczema   . Elevated LFTs   . Fatty liver   . Hepatitis 10/2017   NASH cirrhosis  . History of kidney stones   . Hypertension   . Insomnia 04/26/2013  . Kidney stone   . Morbid obesity ([place])   . Neuromuscular disorder (HCC)    neuropathy  . Neuropathy   . Post-traumatic hydrocephalus (HCC)    s/p shunts x  2 (first got infected )  . Psoriasis    sees Dr [name]  . Psoriatic arthritis ([place])   . 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 Sleep apnea    no CPAP     . Stomach ulcer   . Testosterone deficiency 04/28/2011    Past Surgical History:  Procedure Laterality Date  . BACK SURGERY  1980  . BRAIN SURGERY     VP shunts placed in 2007  . CHOLECYSTECTOMY N/A 08/25/2017   Procedure: LAPAROSCOPIC CHOLECYSTECTOMY WITH INTRAOPERATIVE CHOLANGIOGRAM;  Surgeon: [name], MD;  Location: [facility];  Service: General;  Laterality: N/A;  . COLONOSCOPY    . JOINT REPLACEMENT     total hip  . SHOULDER SURGERY Left 2010  . TOE SURGERY Left 2018  . TONSILLECTOMY     as a child  . TOTAL HIP ARTHROPLASTY Left 2011  . VENTRICULOPERITONEAL SHUNT  2007   x2    Family History  Problem Relation Age of Onset  . Other Mother   . Lung cancer Father        alive, former smoker   . Heart disease Brother        MI age 13  . Other Brother        Murdered  . Down syndrome  Son   . Diabetes Neg Hx   . Prostate cancer Neg Hx   . Colon cancer Neg Hx   . Stomach cancer Neg Hx   . Pancreatic cancer Neg Hx   . Liver disease Neg Hx     Social History:  reports that he has never smoked. He has never used smokeless tobacco. He reports that he does not drink alcohol or use drugs.    Review of Systems     HYPERTENSION: On carvedilol for this Followed by PCP   BP Readings from Last 3 Encounters:  03/21/19 (!) 151/87  03/17/19 129/77  03/08/19 128/86   RENAL dysfunction: Etiology unclear but creatinine improving  Lab Results  Component Value Date   CREATININE 1.56 (H) 03/19/2019   CREATININE 1.47 (H) 03/08/2019   CREATININE 1.7 (A) 02/24/2019    Lipid history: He is on Lipitor 10 mg with control of LDL previously Is on fenofibrate for triglyceride treatment, has increased triglycerides by history  No recent labs available   Lab Results  Component Value Date   CHOL 170 05/03/2018   HDL 43.00 05/03/2018   LDLCALC 137 (H) 09/07/2016   LDLDIRECT 89.0 05/03/2018   TRIG 332.0 (H) 05/03/2018   CHOLHDL 4 05/03/2018            Most recent foot exam: 08/2017  He has symptomatic painful neuropathy and is taking gabapentin 600 mg twice a day and  Cymbalta 60 mg  Has been followed by neurologist    Review of Systems    Physical Examination:  There were no vitals taken for this visit.      ASSESSMENT:  Diabetes type 2 with obesity  See history of present illness for discussion of current diabetes management, blood sugar patterns and problems identified  Currently on a regimen of premixed insulin, Rybelsus and low-dose Amaryl  A1c is slightly better at 9.2  He has now been off insulin This is despite his previously needing larger doses of insulin 3 times a day Most likely with continuing to titrate his Rybelsus, relief of pain from his back pain after surgery as well as reduction of glucose toxicity  he is not responding better to  non-insulin treatments  Currently also on minimal doses of metformin and Amaryl Even with taking prednisone Dosepak recently his blood sugars only a couple of times have been over 200 Also discussed needing to improve his diet with cutting back on high fat foods and regular soft and His wife is asking about doing a keto diet but discussed that this may not be a balanced diet especially with not being able to keep this going long-term May also benefit from follow-up consultation with dietitian   PLAN:   He will try Amaryl 1 mg twice a day for now No change in Rybelsus May consider increasing metformin if renal function continues to improve Needs short-term follow-up in 6 weeks More readings after meals Dietary changes as discussed above   There are no Patient Instructions on file for this visit.  Counseling time on subjects discussed in assessment and plan sections is over 50% of today's 25 minute visit   Elayne Snare 03/28/2019, 8:34 PM

## 2019-04-03 ENCOUNTER — Telehealth: Payer: Self-pay | Admitting: *Deleted

## 2019-04-03 DIAGNOSIS — K7460 Unspecified cirrhosis of liver: Secondary | ICD-10-CM

## 2019-04-03 NOTE — Telephone Encounter (Signed)
-----   Message from Larina Bras, [place] sent at 09/29/2018 12:34 PM EDT ----- Pt needs hcc screening ultrasound due to cirrhosis around 03/2019. James [name] patient. See 09/29/18 televisit note

## 2019-04-03 NOTE — Telephone Encounter (Signed)
Patient has been scheduled for abdominal ultrasound for [facility] screen at [facility] on 05/09/19 at 9 am, 8:45 am arrival. NPO midnight. I have spoken to patient to advise of this and he verbalizes understanding of this and is agreement with plan. He had requested we put off ultrasound until November due to a recent back surgery that he is recovering from.

## 2019-04-05 DIAGNOSIS — Z6835 Body mass index (BMI) 35.0-35.9, adult: Secondary | ICD-10-CM | POA: Insufficient documentation

## 2019-04-05 DIAGNOSIS — Z6832 Body mass index (BMI) 32.0-32.9, adult: Secondary | ICD-10-CM | POA: Insufficient documentation

## 2019-04-05 DIAGNOSIS — M5416 Radiculopathy, lumbar region: Secondary | ICD-10-CM | POA: Diagnosis not present

## 2019-04-05 DIAGNOSIS — M545 Low back pain, unspecified: Secondary | ICD-10-CM

## 2019-04-05 DIAGNOSIS — K7581 Nonalcoholic steatohepatitis (NASH): Secondary | ICD-10-CM | POA: Diagnosis not present

## 2019-04-05 DIAGNOSIS — E669 Obesity, unspecified: Secondary | ICD-10-CM | POA: Insufficient documentation

## 2019-04-05 DIAGNOSIS — G911 Obstructive hydrocephalus: Secondary | ICD-10-CM | POA: Diagnosis not present

## 2019-04-05 DIAGNOSIS — I10 Essential (primary) hypertension: Secondary | ICD-10-CM | POA: Insufficient documentation

## 2019-04-05 HISTORY — DX: Low back pain, unspecified: M54.50

## 2019-04-05 HISTORY — DX: Essential (primary) hypertension: I10

## 2019-04-05 HISTORY — DX: Body mass index (BMI) 35.0-35.9, adult: Z68.35

## 2019-04-06 ENCOUNTER — Other Ambulatory Visit: Payer: Self-pay | Admitting: *Deleted

## 2019-04-06 NOTE — Patient Outreach (Signed)
[facility]) Care Management  04/06/2019  James [name] 1955-09-10 [id number]  Monthly telephonic UMR case management review with [facility] and Dr. Alonza Bogus. Update provided to include: Transition of care call completed 9/24. Patient completed vrtual visit with endocrinologist 9/29. He will continue on Rybelsus and Metformin- off all insulin, Amaryl increased to 1 mg twice daily as most recent A1C was 9.2% on 9/9.  Barrington Ellison RN,CCM,CDE [place] Management Coordinator Office Phone [phone] Office Fax [phone]

## 2019-04-10 MED FILL — GABAPENTIN 600 MG TABS: 600 | 30 days supply | Qty: 60 | Fill #3

## 2019-04-10 MED FILL — OMEPRAZOLE 40 MG CPDR: 40 | 90 days supply | Qty: 180 | Fill #2

## 2019-04-10 MED FILL — EMGALITY 120 MG/ML SOAJ: 120 | 90 days supply | Qty: 3 | Fill #2

## 2019-04-10 MED FILL — levETIRAcetam 750 MG TABS: 750 | 30 days supply | Qty: 60 | Fill #6

## 2019-04-10 MED FILL — HUMIRA PEN 40 MG/0.8ML PNKT: 40 | 28 days supply | Qty: 2 | Fill #1

## 2019-04-12 ENCOUNTER — Telehealth (INDEPENDENT_AMBULATORY_CARE_PROVIDER_SITE_OTHER): Payer: 59 | Admitting: Pharmacist

## 2019-04-12 DIAGNOSIS — Z79899 Other long term (current) drug therapy: Secondary | ICD-10-CM

## 2019-04-12 NOTE — Progress Notes (Signed)
S: Patient has a follow up today for his specialty medication, Humira.   Patient is currently taking Humira for psoriatic arthritis. Patient is managed by Crista Luria for this.   Adherence: denies any missed doses except for when he had back surgery.   Efficacy: reports that it is still working well for him.  Dosing: SubQ: 40 mg every other week  Drug-drug interactions:none  Screening: TB test: negative per patient  Hepatitis: completed prior to drug initiation   Monitoring: S/sx of infection: none currently CBC: last CBC low (see below) S/sx of hypersensitivity: none S/sx of malignancy: none S/sx of heart failure: no diagnosis of HF    O:     Lab Results  Component Value Date   WBC 8.3 03/19/2019   HGB 10.4 (L) 03/19/2019   HCT 31.8 (L) 03/19/2019   MCV 87.6 03/19/2019   PLT 130 (L) 03/19/2019      Chemistry      Component Value Date/Time   NA 135 03/19/2019 1303   NA 139 02/24/2019   K 4.5 03/19/2019 1303   CL 102 03/19/2019 1303   CO2 22 03/19/2019 1303   BUN 17 03/19/2019 1303   BUN 21 02/24/2019   CREATININE 1.56 (H) 03/19/2019 1303   GLU 189 02/24/2019      Component Value Date/Time   CALCIUM 8.9 03/19/2019 1303   ALKPHOS 69 03/08/2019 1155   AST 34 03/08/2019 1155   ALT 37 03/08/2019 1155   BILITOT 0.7 03/08/2019 1155       A/P: 1. Medication review: Patient on Humira for psoriatic arthritis and is tolerating it well with no adverse effects and reported control of psoriatic arthritis. Patient has been stable on medication for a while now. Patient denies any questions/concerns. No recommendations for any changes.    Christella Hartigan, PharmD, BCPS, BCACP, CPP Clinical Pharmacist Practitioner  [phone]

## 2019-04-18 MED FILL — RYBELSUS 14 MG TABS: 14 | 30 days supply | Qty: 30 | Fill #1

## 2019-04-18 MED FILL — GLIMEPIRIDE 1 MG TABLET: 1 | 30 days supply | Qty: 60 | Fill #0

## 2019-04-24 ENCOUNTER — Ambulatory Visit (INDEPENDENT_AMBULATORY_CARE_PROVIDER_SITE_OTHER): Payer: 59 | Admitting: Internal Medicine

## 2019-04-24 ENCOUNTER — Other Ambulatory Visit: Payer: Self-pay

## 2019-04-24 ENCOUNTER — Other Ambulatory Visit: Payer: Self-pay | Admitting: Endocrinology

## 2019-04-24 ENCOUNTER — Encounter: Payer: Self-pay | Admitting: Internal Medicine

## 2019-04-24 VITALS — BP 136/79 | HR 88 | Temp 97.3°F | Resp 16 | Ht 71.0 in | Wt 256.0 lb

## 2019-04-24 DIAGNOSIS — E1122 Type 2 diabetes mellitus with diabetic chronic kidney disease: Secondary | ICD-10-CM

## 2019-04-24 DIAGNOSIS — N182 Chronic kidney disease, stage 2 (mild): Secondary | ICD-10-CM | POA: Diagnosis not present

## 2019-04-24 DIAGNOSIS — Z23 Encounter for immunization: Secondary | ICD-10-CM | POA: Diagnosis not present

## 2019-04-24 DIAGNOSIS — D500 Iron deficiency anemia secondary to blood loss (chronic): Secondary | ICD-10-CM

## 2019-04-24 DIAGNOSIS — K219 Gastro-esophageal reflux disease without esophagitis: Secondary | ICD-10-CM

## 2019-04-24 DIAGNOSIS — Z0000 Encounter for general adult medical examination without abnormal findings: Secondary | ICD-10-CM | POA: Diagnosis not present

## 2019-04-24 MED ORDER — DOXYCYCLINE HYCLATE 100 MG PO TABS: 100.0000 mg | ORAL_TABLET | Freq: Two times a day (BID) | ORAL | 0 refills | Status: DC

## 2019-04-24 MED ORDER — PANTOPRAZOLE SODIUM 40 MG PO TBEC: 40.0000 mg | DELAYED_RELEASE_TABLET | Freq: Every day | ORAL | 1 refills | Status: DC

## 2019-04-24 MED FILL — DULOXETINE HCL 60 MG CPEP: 60 | 30 days supply | Qty: 60 | Fill #3

## 2019-04-24 MED FILL — PANTOPRAZOLE SOD DR 40 MG T: 40 | 60 days supply | Qty: 60 | Fill #0

## 2019-04-24 MED FILL — DOXYCYCLINE HYCLATE 100 MG: 100 | 7 days supply | Qty: 14 | Fill #0

## 2019-04-24 MED FILL — FENOFIBRATE 134 MG CAPSULE: 134 | 5 days supply | Qty: 15 | Fill #0

## 2019-04-24 NOTE — Progress Notes (Signed)
Pre visit review using our clinic review tool, if applicable. No additional management support is needed unless otherwise documented below in the visit note. 

## 2019-04-24 NOTE — Patient Instructions (Addendum)
Please schedule Medicare Wellness with Glenard Haring.   Per our records you are due for an eye exam. Please contact your eye doctor to schedule an appointment. Please have them send copies of your office visit notes to [place]. Our fax number is [phone].    GO TO THE FRONT DESK Schedule a lab appointment within few days, fasting  Schedule your next appointment   with me in 6 months   Take pantoprazole, and acid reducer: one  tablet before breakfast every day for 4 weeks, then as needed.  If the burning chest pain returns call me or go to the ER if severe

## 2019-04-24 NOTE — Progress Notes (Signed)
Subjective:    Patient ID: James [name], male    DOB: 22-Apr-1956, 63 y.o.   MRN: [id number]  DOS:  04/24/2019 Type of visit - description: CPX No new concerns   Review of Systems 2 days ago started with chest pain described mild anterior chest burning, steady throughout the day, decreased with Pepto-Bismol, did not change with exertion.  No associated shortness of breath, cough, nausea, change in the color of the stools. Today pain is better  Occasionally sees discharge from the incision of recent back surgery   Other than above, a 14 point review of systems is negative    Past Medical History:  Diagnosis Date  . Acid reflux   . Arthritis   . Chronic headaches    on cymbalta  . Cirrhosis ([place])   . Colon polyps   . Complication of anesthesia    problems waking up from anesthesia  . Depression    on cymbalta  . Diabetes mellitus with neuropathy ([place])   . Diverticulitis 03/2013  . Eczema   . Elevated LFTs   . Fatty liver   . Hepatitis 10/2017   NASH cirrhosis  . History of kidney stones   . Hypertension   . Insomnia 04/26/2013  . Kidney stone   . Morbid obesity ([place])   . Neuromuscular disorder (H[facility])    neuropathy  . Neuropathy   . Post-traumatic hydrocephalus ([facility])    s/p shunts x 2 (first got infected )  . Psoriasis    sees Dr [name]  . Psoriatic arthritis ([place])   . 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 Sleep apnea    no CPAP     . Stomach ulcer   . Testosterone deficiency 04/28/2011    Past Surgical History:  Procedure Laterality Date  . BACK SURGERY  1980  . BRAIN SURGERY     VP shunts placed in 2007  . CHOLECYSTECTOMY N/A 08/25/2017   Procedure: LAPAROSCOPIC CHOLECYSTECTOMY WITH INTRAOPERATIVE CHOLANGIOGRAM;  Surgeon: [name], MD;  Location: [facility];  Service: General;  Laterality: N/A;  . COLONOSCOPY    . JOINT REPLACEMENT     total hip  . LUMBAR FUSION  03/16/2019  . SHOULDER SURGERY Left 2010  .  TOE SURGERY Left 2018  . TONSILLECTOMY     as a child  . TOTAL HIP ARTHROPLASTY Left 2011  . VENTRICULOPERITONEAL SHUNT  2007   x2    Social History   Socioeconomic History  . Marital status: Married    Spouse name: [name]  . Number of children: 2  . Years of education: Not on file  . Highest education level: Not on file  Occupational History  . Occupation: disabled   Social Needs  . Financial resource strain: Not on file  . Food insecurity    Worry: Not on file    Inability: Not on file  . Transportation needs    Medical: Not on file    Non-medical: Not on file  Tobacco Use  . Smoking status: Never Smoker  . Smokeless tobacco: Never Used  Substance and Sexual Activity  . Alcohol use: No  . Drug use: No  . Sexual activity: Yes    Partners: Female  Lifestyle  . Physical activity    Days per week: Not on file    Minutes per session: Not on file  . Stress: Not on file  Relationships  . Social Herbalist on  phone: Not on file    Gets together: Not on file    Attends religious service: Not on file    Active member of club or organization: Not on file    Attends meetings of clubs or organizations: Not on file    Relationship status: Not on file  . Intimate partner violence    Fear of current or ex partner: Not on file    Emotionally abused: Not on file    Physically abused: Not on file    Forced sexual activity: Not on file  Other Topics Concern  . 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     Family History  Problem Relation Age of Onset  . Other Mother   . Lung cancer Father        alive, former smoker   . Heart disease Brother        MI age 57  . Other Brother        Murdered  . Down syndrome Son   . Diabetes Neg Hx   . Prostate cancer Neg Hx   . Colon cancer Neg Hx   .  Stomach cancer Neg Hx   . Pancreatic cancer Neg Hx   . Liver disease Neg Hx      Allergies as of 04/24/2019      Reactions   Bee Venom Anaphylaxis   Hydrocodone-homatropine Other (See Comments)   Hallucinations, confusion, delirium Depressed feeling   Toradol [ketorolac Tromethamine] Other (See Comments)   Hallucinations, confusion, delirium   Morphine And Related Other (See Comments)   Hallucinations, back in the 80s. States has taken vicodin before w/o problems    Sulfa Drugs Cross Reactors Rash      Medication List       Accurate as of April 24, 2019 11:59 PM. If you have any questions, ask your nurse or doctor.        STOP taking these medications   Iron 325 (65 Fe) MG Tabs Stopped by: Kathlene November, MD   methocarbamol 500 MG tablet Commonly known as: ROBAXIN Stopped by: Kathlene November, MD   methylPREDNISolone 4 MG Tbpk tablet Commonly known as: [place] by: Kathlene November, MD     TAKE these medications   atorvastatin 10 MG tablet Commonly known as: LIPITOR TAKE 1 TABLET (10 MG TOTAL) BY MOUTH DAILY. What changed: when to take this   carvedilol 12.5 MG tablet Commonly known as: COREG Take 1 tablet (12.5 mg total) by mouth 2 (two) times daily with a meal.   doxycycline 100 MG tablet Commonly known as: VIBRA-TABS Take 1 tablet (100 mg total) by mouth 2 (two) times daily. Started by: Kathlene November, MD   DULoxetine 60 MG capsule Commonly known as: CYMBALTA Take 2 capsules (120 mg total) by mouth daily. What changed: when to take this   EPINEPHrine 0.3 mg/0.3 mL Soaj injection Commonly known as: EpiPen 2-Pak Inject 0.3 mLs (0.3 mg total) into the muscle once as needed for up to 1 dose.   fenofibrate micronized 134 MG capsule Commonly known as: LOFIBRA TAKE 1 CAPSULE BY MOUTH THREE TIMES WEEKLY. What changed: additional instructions Changed by: Elayne Snare, MD   FreeStyle Freedom Lite w/Device Kit Use to check blood sugars 3 times daily. Dx code: E11.9    freestyle  lancets Use to check blood sugar 3 times daily. Dx code E11.9   gabapentin 600 MG tablet Commonly known as: NEURONTIN Take 1 tablet (600 mg total) by mouth 2 (two) times daily.   Galcanezumab-gnlm 120 MG/ML Soaj Commonly known as: Emgality Inject 1 pen into the skin every 28 (twenty-eight) days. What changed: how much to take   glimepiride 1 MG tablet Commonly known as: AMARYL Take 1 tablet (1 mg total) by mouth 2 (two) times daily.   glucose blood test strip Use Freestyle Freedom Lite test strips as instructed to check blood sugar 4 times daily.   Humira 20 MG/0.2ML Pskt Generic drug: Adalimumab Inject into the skin.   hydrocortisone 25 MG suppository Commonly known as: ANUSOL-HC Place 1 suppository (25 mg total) rectally 2 (two) times daily as needed for hemorrhoids.   Insulin Pen Needle 31G X 5 MM Misc Use for insulin pen three times a day   levETIRAcetam 750 MG tablet Commonly known as: KEPPRA TAKE 1 TABLET (750 MG TOTAL) BY MOUTH 2 (TWO) TIMES DAILY.   metFORMIN 500 MG tablet Commonly known as: GLUCOPHAGE TAKE 1 TABLET BY MOUTH DAILY WITH A MEAL What changed:   how much to take  how to take this  when to take this  additional instructions   omeprazole 40 MG capsule Commonly known as: PRILOSEC Take 1 capsule (40 mg total) by mouth 2 (two) times daily before a meal.   pantoprazole 40 MG tablet Commonly known as: PROTONIX Take 1 tablet (40 mg total) by mouth daily before breakfast. Started by: Kathlene November, MD   Rybelsus 14 MG Tabs Generic drug: Semaglutide Take 1 tablet by mouth daily. Take 1 tablet by mouth once daily. What changed:   how much to take  when to take this  additional instructions           Objective:   Physical Exam BP 136/79 (BP Location: Left Arm, Patient Position: Sitting, Cuff Size: Normal)   Pulse 88   Temp (!) 97.3 F (36.3 C) (Temporal)   Resp 16   Ht 5' 11"  (1.803 m)   Wt 256 lb (116.1 kg)   SpO2 99%    BMI 35.70 kg/m   General: Well developed, NAD, BMI noted Neck: No  thyromegaly  HEENT:  Normocephalic . Face symmetric, atraumatic Lungs:  CTA B Normal respiratory effort, no intercostal retractions, no accessory muscle use. Heart: RRR,  no murmur.  No pretibial edema bilaterally  Abdomen:  Not distended, soft, non-tender. No rebound or rigidity.   Skin:  At the site of recent lumbar surgery, there is a small opening, I was able to get a minute amount of discharge.  No abscess, no cellulitis type of changes.  No TTP at the area Neurologic:  alert & oriented X3.  Speech normal, gait appropriate for age and unassisted Strength symmetric and appropriate for age.  Psych: Cognition and judgment appear intact.  Cooperative with normal attention span and concentration.  Behavior appropriate. No anxious or depressed appearing.     Assessment    Assessment  DM - Dr [name] Neuropathy (x years, rx gaba 05-2014, w/u 11-2014  RPR neg, vit D-B12-Folic Acid wnl ); Saw Dr Posey Pronto, [facility] [phone] (see results) CRI HTN Hyperlipidemia (TG in the 500s 2016) OSA , dx 2012, sleep study again 02-2015 Dr Dohmeier--> severe OSA, intolerant to CPAP Depression, insomnia: on Cymbalta NEURO: --Chronic headaches :on Cymbalta & Emgality --Posttraumatic hydrocephalus s/p 2 shunts (first got infected) MSK: on disability d/t  back pain- HAs Hypogonadism  Dx 2012, normal T 11-2014 (on no RX) GI:  --GERD, diverticulitis 2014, h/o PUD -- NASH with cirrhosis per GI note 10/2017, s/p Hep A/B shots --Anemia: Mild, on and off, felt to be d/t   GAVE (gastric antral vascular ectasia) and a inflammatory polyp (per EGD 10/2018)  Psoriasis, psoriatic arthritis: used  HUMIRA  H/o urolithiasis +FH CAD brother MI age 67 Abnormal EKG, saw cardiology 01-2019, echo essentially negative, perfusion stress test 03/01/2019 (-)   PLAN Here for CPX DM: Per endocrinology CRI: Check a BMP HTN: BP today is very good, continue  carvedilol. Hyperlipidemia: On fenofibrate, not fasting, check a lipid profile. Chest pain: As described above, recent perfusion stress test negative.  Rx pantoprazole for 4 weeks.  ER if symptoms resurface. History of anemia: Check a CBC RBPR: no further sxs  Incision  infection: See physical exam, back surgery incision seems to have a superficial infection, recommend to call his surgeon today.  Rx doxycycline for now. Plan: Labs in few days fasting. Follow-up with me 6 months

## 2019-04-25 NOTE — Assessment & Plan Note (Signed)
Here for CPX DM: Per endocrinology CRI: Check a BMP HTN: BP today is very good, continue carvedilol. Hyperlipidemia: On fenofibrate, not fasting, check a lipid profile. Chest pain: As described above, recent perfusion stress test negative.  Rx pantoprazole for 4 weeks.  ER if symptoms resurface. History of anemia: Check a CBC RBPR: no further sxs  Incision  infection: See physical exam, back surgery incision seems to have a superficial infection, recommend to call his surgeon today.  Rx doxycycline for now. Plan: Labs in few days fasting. Follow-up with me 6 months

## 2019-04-25 NOTE — Assessment & Plan Note (Signed)
--   Td 2014 - PNM 23: 2019 - PNM 15 at age 63 per guidelines - shingrex : declined, benefits d/w pt   - flu shot: Today --Colonoscopy 07-2013, Dr. Henrene Pastor, had a polyp, next per GI --DRE - PSA wnl 2019.  LUTS?  C/o sometimes not feeling when his bladder is full, denies any other urinary symptoms, no history of UTIs.  Recommend observation for now. --Diet and exercise discussed --Labs: BMP, FLP, CBC.

## 2019-04-27 ENCOUNTER — Other Ambulatory Visit: Payer: 59

## 2019-04-28 ENCOUNTER — Other Ambulatory Visit (INDEPENDENT_AMBULATORY_CARE_PROVIDER_SITE_OTHER): Payer: 59

## 2019-04-28 ENCOUNTER — Other Ambulatory Visit: Payer: Self-pay

## 2019-04-28 DIAGNOSIS — Z0000 Encounter for general adult medical examination without abnormal findings: Secondary | ICD-10-CM

## 2019-04-28 DIAGNOSIS — E782 Mixed hyperlipidemia: Secondary | ICD-10-CM

## 2019-04-28 DIAGNOSIS — E1165 Type 2 diabetes mellitus with hyperglycemia: Secondary | ICD-10-CM | POA: Diagnosis not present

## 2019-04-28 DIAGNOSIS — D500 Iron deficiency anemia secondary to blood loss (chronic): Secondary | ICD-10-CM

## 2019-04-28 LAB — CBC WITH DIFFERENTIAL/PLATELET
Basophils Absolute: 0 10*3/uL (ref 0.0–0.1)
Basophils Relative: 0.3 % (ref 0.0–3.0)
Eosinophils Absolute: 0.1 10*3/uL (ref 0.0–0.7)
Eosinophils Relative: 1.6 % (ref 0.0–5.0)
HCT: 37.1 % — ABNORMAL LOW (ref 39.0–52.0)
Hemoglobin: 11.9 g/dL — ABNORMAL LOW (ref 13.0–17.0)
Lymphocytes Relative: 22.6 % (ref 12.0–46.0)
Lymphs Abs: 1.4 10*3/uL (ref 0.7–4.0)
MCHC: 32.2 g/dL (ref 30.0–36.0)
MCV: 83.7 fl (ref 78.0–100.0)
Monocytes Absolute: 0.4 10*3/uL (ref 0.1–1.0)
Monocytes Relative: 6.9 % (ref 3.0–12.0)
Neutro Abs: 4.4 10*3/uL (ref 1.4–7.7)
Neutrophils Relative %: 68.6 % (ref 43.0–77.0)
Platelets: 225 10*3/uL (ref 150.0–400.0)
RBC: 4.43 Mil/uL (ref 4.22–5.81)
RDW: 17.6 % — ABNORMAL HIGH (ref 11.5–15.5)
WBC: 6.4 10*3/uL (ref 4.0–10.5)

## 2019-04-28 LAB — LIPID PANEL
Cholesterol: 169 mg/dL (ref 0–200)
Cholesterol: 181 mg/dL (ref 0–200)
HDL: 38.9 mg/dL — ABNORMAL LOW (ref >39.00)
HDL: 40.4 mg/dL (ref >39.00)
LDL Cholesterol: 102 mg/dL — ABNORMAL HIGH (ref 0–99)
LDL Cholesterol: 106 mg/dL — ABNORMAL HIGH (ref 0–99)
NonHDL: 130.35
NonHDL: 140.12
Total CHOL/HDL Ratio: 4
Total CHOL/HDL Ratio: 4
Triglycerides: 143 mg/dL (ref 0.0–149.0)
Triglycerides: 170 mg/dL — ABNORMAL HIGH (ref 0.0–149.0)
VLDL: 28.6 mg/dL (ref 0.0–40.0)
VLDL: 34 mg/dL (ref 0.0–40.0)

## 2019-04-28 LAB — COMPREHENSIVE METABOLIC PANEL
ALT: 25 U/L (ref 0–53)
AST: 28 U/L (ref 0–37)
Albumin: 4.1 g/dL (ref 3.5–5.2)
Alkaline Phosphatase: 89 U/L (ref 39–117)
BUN: 18 mg/dL (ref 6–23)
CO2: 26 mEq/L (ref 19–32)
Calcium: 9.9 mg/dL (ref 8.4–10.5)
Chloride: 103 mEq/L (ref 96–112)
Creatinine, Ser: 1.31 mg/dL (ref 0.40–1.50)
GFR: 55.6 mL/min — ABNORMAL LOW (ref >60.00)
Glucose, Bld: 173 mg/dL — ABNORMAL HIGH (ref 70–99)
Potassium: 4.5 mEq/L (ref 3.5–5.1)
Sodium: 137 mEq/L (ref 135–145)
Total Bilirubin: 0.6 mg/dL (ref 0.2–1.2)
Total Protein: 7.9 g/dL (ref 6.0–8.3)

## 2019-04-28 LAB — BASIC METABOLIC PANEL
BUN: 18 mg/dL (ref 6–23)
CO2: 27 mEq/L (ref 19–32)
Calcium: 9.8 mg/dL (ref 8.4–10.5)
Chloride: 103 mEq/L (ref 96–112)
Creatinine, Ser: 1.35 mg/dL (ref 0.40–1.50)
GFR: 53.7 mL/min — ABNORMAL LOW (ref >60.00)
Glucose, Bld: 183 mg/dL — ABNORMAL HIGH (ref 70–99)
Potassium: 4.8 mEq/L (ref 3.5–5.1)
Sodium: 139 mEq/L (ref 135–145)

## 2019-04-28 LAB — HEMOGLOBIN A1C: Hgb A1c MFr Bld: 7.6 % — ABNORMAL HIGH (ref 4.6–6.5)

## 2019-04-29 LAB — FRUCTOSAMINE: Fructosamine: 301 umol/L — ABNORMAL HIGH (ref 0–285)

## 2019-05-01 MED ORDER — ATORVASTATIN CALCIUM 20 MG PO TABS: 20.0000 mg | ORAL_TABLET | Freq: Every day | ORAL | 1 refills | Status: DC

## 2019-05-01 MED FILL — ATORVASTATIN 20 MG TABLET: 20 | 90 days supply | Qty: 90 | Fill #0

## 2019-05-01 NOTE — Addendum Note (Signed)
Addended byDamita Dunnings D on: 05/01/2019 08:35 AM   Modules accepted: Orders

## 2019-05-02 ENCOUNTER — Other Ambulatory Visit: Payer: Self-pay

## 2019-05-03 ENCOUNTER — Ambulatory Visit (INDEPENDENT_AMBULATORY_CARE_PROVIDER_SITE_OTHER): Payer: 59 | Admitting: Endocrinology

## 2019-05-03 ENCOUNTER — Encounter: Payer: Self-pay | Admitting: Endocrinology

## 2019-05-03 VITALS — BP 140/86 | HR 85 | Ht 71.0 in | Wt 256.6 lb

## 2019-05-03 DIAGNOSIS — E782 Mixed hyperlipidemia: Secondary | ICD-10-CM | POA: Diagnosis not present

## 2019-05-03 DIAGNOSIS — E1165 Type 2 diabetes mellitus with hyperglycemia: Secondary | ICD-10-CM | POA: Diagnosis not present

## 2019-05-03 DIAGNOSIS — I2510 Atherosclerotic heart disease of native coronary artery without angina pectoris: Secondary | ICD-10-CM

## 2019-05-03 NOTE — Patient Instructions (Addendum)
No Cokes, reg drinks  Humalog mix 10 units in am and supper daily  Keep am sugar <130 by going up on pm dose  Must check at 8-10 pm  Take Glimeperide 2 pills at bedtime only  Walk upto 15-20 min daily  Lo fat diet

## 2019-05-03 NOTE — Progress Notes (Signed)
Patient ID: James [name], male   DOB: Nov 20, 1955, 63 y.o.   MRN: [id number]           Reason for Appointment: Follow-up for Type 2 Diabetes    History of Present Illness:          Date of diagnosis of type 2 diabetes mellitus:  ?  2014      Background history:  He is not clear how his diabetes was diagnosed, likely on routine testing. Initially had been treated with metformin and also tried on Amaryl Patient had progressive increase in his blood sugars since 1/17 with stopping metformin and being on the regimen of Amaryl and Januvia, he thinks his blood sugars went up to 601.  He was then started on basal bolus insulin   Recent history:    Non-insulin hypoglycemic drugs the patient is taking are: Rybelsus 14 mg daily, Amaryl 76m bedtime, metformin 500 mg daily  His A1c is down to 7.6, and was 9.2 Fructosamine is 301  Lowest A1c was 6.8 previously  Current management, blood sugar patterns and problems identified:  He had been on insulin prior to his surgery for his back and none since after the surgery  Although his blood sugars were looking reasonably good on his last visit in September they are looking higher now  However he is not checking his blood sugars much because he feels fingersticks are uncomfortable  No readings after meals  Despite starting AMARYL on his last visit his blood sugars are higher even in the morning  He has not done any formal exercise or walking even though he can do some walking now  His diet is poor with high fat foods and regular soft drinks consistently  Taking Rybelsus consistently every morning as directed  Although he has lost weight this year this is now plateaued He is asking about doing the freestyle libre  Side  effects from medications have been: None  Compliance with the medical regimen: Fair  Glucose monitoring:  done up to 3 times a day         Glucometer:  Accu-Chek Blood Glucose readings    PRE-MEAL Fasting Lunch  Dinner Bedtime Overall  Glucose range:  128-178  106, 189  138-268    Mean/median:  157   195   166   Previous readings:  PRE-MEAL Fasting Lunch Dinner Bedtime Overall  Glucose range:  85-151  118-234  116-185    Mean/median:     ?     Self-care:   Meal times are:  Breakfast variable, lunch: 2-3 PM Dinner: 5-6 PM    Dietician visit, most recent:09/2015               CDE consultation: 12/2018  Weight history:  Wt Readings from Last 3 Encounters:  05/03/19 256 lb 9.6 oz (116.4 kg)  04/24/19 256 lb (116.1 kg)  03/20/19 280 lb 3.3 oz (127.1 kg)    Glycemic control:   Lab Results  Component Value Date   HGBA1C 7.6 (H) 04/28/2019   HGBA1C 9.2 (H) 03/08/2019   HGBA1C 11.0 (A) 12/28/2018   Lab Results  Component Value Date   MICROALBUR 3.4 (H) 12/28/2018   LDLCALC 106 (H) 04/28/2019   CREATININE 1.35 04/28/2019    Lab on 04/28/2019  Component Date Value Ref Range Status  . WBC 04/28/2019 6.4  4.0 - 10.5 K/uL Final  . RBC 04/28/2019 4.43  4.22 - 5.81 Mil/uL Final  . Hemoglobin 04/28/2019 11.9* 13.0 -  17.0 g/dL Final  . HCT 04/28/2019 37.1* 39.0 - 52.0 % Final  . MCV 04/28/2019 83.7  78.0 - 100.0 fl Final  . MCHC 04/28/2019 32.2  30.0 - 36.0 g/dL Final  . RDW 04/28/2019 17.6* 11.5 - 15.5 % Final  . Platelets 04/28/2019 225.0  150.0 - 400.0 K/uL Final  . Neutrophils Relative % 04/28/2019 68.6  43.0 - 77.0 % Final  . Lymphocytes Relative 04/28/2019 22.6  12.0 - 46.0 % Final  . Monocytes Relative 04/28/2019 6.9  3.0 - 12.0 % Final  . Eosinophils Relative 04/28/2019 1.6  0.0 - 5.0 % Final  . Basophils Relative 04/28/2019 0.3  0.0 - 3.0 % Final  . Neutro Abs 04/28/2019 4.4  1.4 - 7.7 K/uL Final  . Lymphs Abs 04/28/2019 1.4  0.7 - 4.0 K/uL Final  . Monocytes Absolute 04/28/2019 0.4  0.1 - 1.0 K/uL Final  . Eosinophils Absolute 04/28/2019 0.1  0.0 - 0.7 K/uL Final  . Basophils Absolute 04/28/2019 0.0  0.0 - 0.1 K/uL Final  . Cholesterol 04/28/2019 181  0 - 200 mg/dL Final    ATP III Classification       Desirable:  < 200 mg/dL               Borderline High:  200 - 239 mg/dL          High:  > = 240 mg/dL  . Triglycerides 04/28/2019 170.0* 0.0 - 149.0 mg/dL Final   Normal:  <150 mg/dLBorderline High:  150 - 199 mg/dL  . HDL 04/28/2019 40.40  >39.00 mg/dL Final  . VLDL 04/28/2019 34.0  0.0 - 40.0 mg/dL Final  . LDL Cholesterol 04/28/2019 106* 0 - 99 mg/dL Final  . Total CHOL/HDL Ratio 04/28/2019 4   Final                  Men          Women1/2 Average Risk     3.4          3.3Average Risk          5.0          4.42X Average Risk          9.6          7.13X Average Risk          15.0          11.0                      . NonHDL 04/28/2019 140.12   Final   NOTE:  Non-HDL goal should be 30 mg/dL higher than patient's LDL goal (i.e. LDL goal of < 70 mg/dL, would have non-HDL goal of < 100 mg/dL)  . Sodium 04/28/2019 139  135 - 145 mEq/L Final  . Potassium 04/28/2019 4.8  3.5 - 5.1 mEq/L Final  . Chloride 04/28/2019 103  96 - 112 mEq/L Final  . CO2 04/28/2019 27  19 - 32 mEq/L Final  . Glucose, Bld 04/28/2019 183* 70 - 99 mg/dL Final  . BUN 04/28/2019 18  6 - 23 mg/dL Final  . Creatinine, Ser 04/28/2019 1.35  0.40 - 1.50 mg/dL Final  . Calcium 04/28/2019 9.8  8.4 - 10.5 mg/dL Final  . GFR 04/28/2019 53.70* >60.00 mL/min Final  Lab on 04/28/2019  Component Date Value Ref Range Status  . Cholesterol 04/28/2019 169  0 - 200 mg/dL Final   ATP III Classification  Desirable:  < 200 mg/dL               Borderline High:  200 - 239 mg/dL          High:  > = 240 mg/dL  . Triglycerides 04/28/2019 143.0  0.0 - 149.0 mg/dL Final   Normal:  <150 mg/dLBorderline High:  150 - 199 mg/dL  . HDL 04/28/2019 38.90* >39.00 mg/dL Final  . VLDL 04/28/2019 28.6  0.0 - 40.0 mg/dL Final  . LDL Cholesterol 04/28/2019 102* 0 - 99 mg/dL Final  . Total CHOL/HDL Ratio 04/28/2019 4   Final                  Men          Women1/2 Average Risk     3.4          3.3Average Risk          5.0           4.42X Average Risk          9.6          7.13X Average Risk          15.0          11.0                      . NonHDL 04/28/2019 130.35   Final   NOTE:  Non-HDL goal should be 30 mg/dL higher than patient's LDL goal (i.e. LDL goal of < 70 mg/dL, would have non-HDL goal of < 100 mg/dL)  . Fructosamine 04/28/2019 301* 0 - 285 umol/L Final   Comment: Published reference interval for apparently healthy subjects between age 1 and 41 is 81 - 285 umol/L and in a poorly controlled diabetic population is 228 - 563 umol/L with a mean of 396 umol/L.   Marland Kitchen Sodium 04/28/2019 137  135 - 145 mEq/L Final  . Potassium 04/28/2019 4.5  3.5 - 5.1 mEq/L Final  . Chloride 04/28/2019 103  96 - 112 mEq/L Final  . CO2 04/28/2019 26  19 - 32 mEq/L Final  . Glucose, Bld 04/28/2019 173* 70 - 99 mg/dL Final  . BUN 04/28/2019 18  6 - 23 mg/dL Final  . Creatinine, Ser 04/28/2019 1.31  0.40 - 1.50 mg/dL Final  . Total Bilirubin 04/28/2019 0.6  0.2 - 1.2 mg/dL Final  . Alkaline Phosphatase 04/28/2019 89  39 - 117 U/L Final  . AST 04/28/2019 28  0 - 37 U/L Final  . ALT 04/28/2019 25  0 - 53 U/L Final  . Total Protein 04/28/2019 7.9  6.0 - 8.3 g/dL Final  . Albumin 04/28/2019 4.1  3.5 - 5.2 g/dL Final  . Calcium 04/28/2019 9.9  8.4 - 10.5 mg/dL Final  . GFR 04/28/2019 55.60* >60.00 mL/min Final  . Hgb A1c MFr Bld 04/28/2019 7.6* 4.6 - 6.5 % Final   Glycemic Control Guidelines for People with Diabetes:Non Diabetic:  <6%Goal of Therapy: <7%Additional Action Suggested:  >8%        Allergies as of 05/03/2019      Reactions   Bee Venom Anaphylaxis   Hydrocodone-homatropine Other (See Comments)   Hallucinations, confusion, delirium Depressed feeling   Toradol [ketorolac Tromethamine] Other (See Comments)   Hallucinations, confusion, delirium   Morphine And Related Other (See Comments)   Hallucinations, back in the 80s. States has taken vicodin before w/o problems    Sulfa Drugs Cross Reactors Rash  Medication List       Accurate as of May 03, 2019 11:59 PM. If you have any questions, ask your nurse or doctor.        STOP taking these medications   doxycycline 100 MG tablet Commonly known as: VIBRA-TABS Stopped by: Elayne Snare, MD   Insulin Pen Needle 31G X 5 MM Misc Stopped by: Elayne Snare, MD     TAKE these medications   atorvastatin 20 MG tablet Commonly known as: LIPITOR Take 1 tablet (20 mg total) by mouth at bedtime.   carvedilol 12.5 MG tablet Commonly known as: COREG Take 1 tablet (12.5 mg total) by mouth 2 (two) times daily with a meal.   DULoxetine 60 MG capsule Commonly known as: CYMBALTA Take 2 capsules (120 mg total) by mouth daily. What changed: when to take this   EPINEPHrine 0.3 mg/0.3 mL Soaj injection Commonly known as: EpiPen 2-Pak Inject 0.3 mLs (0.3 mg total) into the muscle once as needed for up to 1 dose.   fenofibrate micronized 134 MG capsule Commonly known as: LOFIBRA TAKE 1 CAPSULE BY MOUTH THREE TIMES WEEKLY.   FreeStyle Freedom Lite w/Device Kit Use to check blood sugars 3 times daily. Dx code: E11.9   freestyle lancets Use to check blood sugar 3 times daily. Dx code E11.9   gabapentin 600 MG tablet Commonly known as: NEURONTIN Take 1 tablet (600 mg total) by mouth 2 (two) times daily.   Galcanezumab-gnlm 120 MG/ML Soaj Commonly known as: Emgality Inject 1 pen into the skin every 28 (twenty-eight) days. What changed: how much to take   glimepiride 1 MG tablet Commonly known as: AMARYL Take 1 tablet (1 mg total) by mouth 2 (two) times daily.   glucose blood test strip Use Freestyle Freedom Lite test strips as instructed to check blood sugar 4 times daily.   Humira 20 MG/0.2ML Pskt Generic drug: Adalimumab Inject into the skin.   hydrocortisone 25 MG suppository Commonly known as: ANUSOL-HC Place 1 suppository (25 mg total) rectally 2 (two) times daily as needed for hemorrhoids.   levETIRAcetam 750 MG tablet  Commonly known as: KEPPRA TAKE 1 TABLET (750 MG TOTAL) BY MOUTH 2 (TWO) TIMES DAILY.   metFORMIN 500 MG tablet Commonly known as: GLUCOPHAGE TAKE 1 TABLET BY MOUTH DAILY WITH A MEAL What changed:   how much to take  how to take this  when to take this  additional instructions   omeprazole 40 MG capsule Commonly known as: PRILOSEC Take 1 capsule (40 mg total) by mouth 2 (two) times daily before a meal.   pantoprazole 40 MG tablet Commonly known as: PROTONIX Take 1 tablet (40 mg total) by mouth daily before breakfast.   Rybelsus 14 MG Tabs Generic drug: Semaglutide Take 1 tablet by mouth daily. Take 1 tablet by mouth once daily. What changed:   how much to take  when to take this  additional instructions       Allergies:  Allergies  Allergen Reactions  . Bee Venom Anaphylaxis  . Hydrocodone-Homatropine Other (See Comments)    Hallucinations, confusion, delirium Depressed feeling  . Toradol [Ketorolac Tromethamine] Other (See Comments)    Hallucinations, confusion, delirium  . Morphine And Related Other (See Comments)    Hallucinations, back in the 80s. States has taken vicodin before w/o problems   . Sulfa Drugs Cross Reactors Rash    Past Medical History:  Diagnosis Date  . Acid reflux   . Arthritis   . Chronic headaches  on cymbalta  . Cirrhosis ([place])   . Colon polyps   . Complication of anesthesia    problems waking up from anesthesia  . Depression    on cymbalta  . Diabetes mellitus with neuropathy ([place])   . Diverticulitis 03/2013  . Eczema   . Elevated LFTs   . Fatty liver   . Hepatitis 10/2017   NASH cirrhosis  . History of kidney stones   . Hypertension   . Insomnia 04/26/2013  . Kidney stone   . Morbid obesity ([place])   . Neuromuscular disorder (H[facility])    neuropathy  . Neuropathy   . Post-traumatic hydrocephalus (H[facility])    s/p shunts x 2 (first got infected )  . Psoriasis    sees Dr [name]  . Psoriatic arthritis ([place])   .  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 Sleep apnea    no CPAP     . Stomach ulcer   . Testosterone deficiency 04/28/2011    Past Surgical History:  Procedure Laterality Date  . BACK SURGERY  1980  . BRAIN SURGERY     VP shunts placed in 2007  . CHOLECYSTECTOMY N/A 08/25/2017   Procedure: LAPAROSCOPIC CHOLECYSTECTOMY WITH INTRAOPERATIVE CHOLANGIOGRAM;  Surgeon: [name], MD;  Location: [facility];  Service: General;  Laterality: N/A;  . COLONOSCOPY    . JOINT REPLACEMENT     total hip  . LUMBAR FUSION  03/16/2019  . SHOULDER SURGERY Left 2010  . TOE SURGERY Left 2018  . TONSILLECTOMY     as a child  . TOTAL HIP ARTHROPLASTY Left 2011  . VENTRICULOPERITONEAL SHUNT  2007   x2    Family History  Problem Relation Age of Onset  . Other Mother   . Lung cancer Father        alive, former smoker   . Heart disease Brother        MI age 1  . Other Brother        Murdered  . Down syndrome Son   . Diabetes Neg Hx   . Prostate cancer Neg Hx   . Colon cancer Neg Hx   . Stomach cancer Neg Hx   . Pancreatic cancer Neg Hx   . Liver disease Neg Hx     Social History:  reports that he has never smoked. He has never used smokeless tobacco. He reports that he does not drink alcohol or use drugs.    Review of Systems     HYPERTENSION: On carvedilol for treatment Followed by PCP   BP Readings from Last 3 Encounters:  05/03/19 140/86  04/24/19 136/79  03/21/19 (!) 151/87   RENAL dysfunction: Etiology unclear but creatinine improved  Lab Results  Component Value Date   CREATININE 1.35 04/28/2019   CREATININE 1.31 04/28/2019   CREATININE 1.56 (H) 03/19/2019    Lipid history: He is on Lipitor 10 mg and PCP has just increased the dose to 20 mg for LDL over 100  Is on fenofibrate for triglyceride treatment, has increased triglycerides, relatively better now      Lab Results  Component Value Date   CHOL 181 04/28/2019   HDL 40.40  04/28/2019   LDLCALC 106 (H) 04/28/2019   LDLDIRECT 89.0 05/03/2018   TRIG 170.0 (H) 04/28/2019   CHOLHDL 4 04/28/2019            Most recent foot exam: 08/2017  He has symptomatic painful neuropathy and is taking  gabapentin 600 mg twice a day and  Cymbalta 60 mg  Has been followed by neurologist    Review of Systems    Physical Examination:  BP 140/86 (BP Location: Left Arm, Patient Position: Sitting, Cuff Size: Normal)   Pulse 85   Ht _0  (1.803 m)   Wt 256 lb 9.6 oz (116.4 kg)   SpO2 96%   BMI 35.79 kg/m       ASSESSMENT:  Diabetes type 2 with obesity  See history of present illness for discussion of current diabetes management, blood sugar patterns and problems identified  Currently on a regimen of premixed insulin, Rybelsus and low-dose Amaryl  A1c is improved at 7.6  He has been off insulin and his blood sugar control appears to be getting worse especially after meals and also some fasting  This is despite his being on 14 mg Rybelsus and starting Amaryl twice a day His diet is poor and he is getting a lot of high fat foods and regular soft drinks  Currently however is only on 500 mg of metformin which was reduced because of renal dysfunction Renal function has come back nearly to normal   PLAN:   He will try his Humalog mix again, 10 units before breakfast and dinner Increase Metformin to 1 g twice daily as before Stop eating all high-fat foods and given a list of foods to avoid Eliminate all regular soft drinks Start walking for exercise May try freestyle libre, discussed how this works and how it would be applied and need to check blood sugars every 6-8 hours to keep a record He will call if he has any difficulty with starting this or downloading the app on his phone Change glimepiride to 2 mg at bedtime for now Start checking blood sugars after meals consistently  Continue follow-up with PCP for lipids and informed him that he has been given a  new prescription for Lipitor by PCP   Patient Instructions  No Cokes, reg drinks  Humalog mix 10 units in am and supper daily  Keep am sugar <130 by going up on pm dose  Must check at 8-10 pm  Take Glimeperide 2 pills at bedtime only  Walk upto 15-20 min daily  Lo fat diet   Counseling time on subjects discussed in assessment and plan sections is over 50% of today's 25 minute visit   Elayne Snare 05/04/2019, 9:12 AM

## 2019-05-04 ENCOUNTER — Other Ambulatory Visit: Payer: Self-pay

## 2019-05-04 ENCOUNTER — Telehealth: Payer: Self-pay | Admitting: Endocrinology

## 2019-05-04 MED ORDER — FREESTYLE LIBRE 2 SENSOR SYSTM MISC: 2.0000 [IU] | 3 refills | Status: DC

## 2019-05-04 MED ORDER — FREESTYLE LIBRE 2 READER SYSTM DEVI: 1.0000 | Freq: Once | 0 refills | Status: AC

## 2019-05-04 MED ORDER — METFORMIN HCL 500 MG PO TABS: ORAL_TABLET | ORAL | 3 refills | Status: DC

## 2019-05-04 MED FILL — FREESTYLE LITE TEST STRIP: 25 days supply | Qty: 100 | Fill #2

## 2019-05-04 MED FILL — HUMIRA PEN 40 MG/0.8ML PNKT: 40 | 28 days supply | Qty: 2 | Fill #2

## 2019-05-04 MED FILL — ATORVASTATIN 10 MG TABLET: 10 | 90 days supply | Qty: 90 | Fill #4

## 2019-05-04 MED FILL — metFORMIN HCL 500 MG TABS: 500 | 90 days supply | Qty: 360 | Fill #0

## 2019-05-04 MED FILL — FENOFIBRATE 134 MG CAPSULE: 134 | 5 days supply | Qty: 15 | Fill #1

## 2019-05-04 NOTE — Telephone Encounter (Signed)
Please call patient, his kidney test has come back normal so we need to increase his Metformin to 2 tablets of 500 mg twice daily

## 2019-05-04 NOTE — Telephone Encounter (Signed)
Called pt and gave him MD message. Pt verbalized understanding.

## 2019-05-08 ENCOUNTER — Telehealth: Payer: Self-pay

## 2019-05-08 MED FILL — GABAPENTIN 600 MG TABS: 600 | 30 days supply | Qty: 60 | Fill #4

## 2019-05-08 NOTE — Telephone Encounter (Signed)
PA initiated via [email or web address] for [employer] 14 day sensors and readers.  Pa for the Sensors: James [name] Key: [id number] - PA Case ID: [id number] help? Call us at [phone] Status Sent to [place] 14 Day Sensor Form Med[employer] ePA Form  [place] for Reader: James [name] Key: [id number] - PA Case ID: [id number] help? Call us at [phone] Status Sent to [employer] FreeStyle Libre 14 Day Reader device Form [employer] ePA Form

## 2019-05-09 ENCOUNTER — Ambulatory Visit (HOSPITAL_COMMUNITY)
Admission: RE | Admit: 2019-05-09 | Discharge: 2019-05-09 | Disposition: A | Discharge status: Home / Self Care | Payer: 59 | Source: Ambulatory Visit | Attending: Internal Medicine | Admitting: Internal Medicine

## 2019-05-09 ENCOUNTER — Other Ambulatory Visit: Payer: Self-pay

## 2019-05-09 DIAGNOSIS — K7460 Unspecified cirrhosis of liver: Secondary | ICD-10-CM | POA: Diagnosis not present

## 2019-05-09 NOTE — Telephone Encounter (Signed)
Received fax from [place] stating that pt has been approved for the FreeStyle [place] 2 reader and sensors.  Approval is good from 05/09/2019 through 05/07/2020

## 2019-05-10 MED FILL — FREESTYLE LIBRE 14 DAY READ: 30 days supply | Qty: 1 | Fill #0

## 2019-05-10 MED FILL — FREESTYLE LIBRE 14 DAY SENS: 28 days supply | Qty: 2 | Fill #0

## 2019-05-12 MED FILL — GLIMEPIRIDE 1 MG TABLET: 1 | 30 days supply | Qty: 60 | Fill #1

## 2019-05-12 MED FILL — levETIRAcetam 750 MG TABS: 750 | 30 days supply | Qty: 60 | Fill #7

## 2019-05-15 DIAGNOSIS — M545 Low back pain: Secondary | ICD-10-CM | POA: Diagnosis not present

## 2019-05-15 DIAGNOSIS — G911 Obstructive hydrocephalus: Secondary | ICD-10-CM | POA: Diagnosis not present

## 2019-05-15 DIAGNOSIS — K7581 Nonalcoholic steatohepatitis (NASH): Secondary | ICD-10-CM | POA: Diagnosis not present

## 2019-05-15 DIAGNOSIS — M5416 Radiculopathy, lumbar region: Secondary | ICD-10-CM | POA: Diagnosis not present

## 2019-05-15 DIAGNOSIS — M4316 Spondylolisthesis, lumbar region: Secondary | ICD-10-CM | POA: Diagnosis not present

## 2019-05-23 MED FILL — DULOXETINE HCL 60 MG CPEP: 60 | 30 days supply | Qty: 60 | Fill #4

## 2019-05-23 MED FILL — CARVEDILOL 12.5 MG TABLET: 12.5 | 90 days supply | Qty: 180 | Fill #1

## 2019-05-29 ENCOUNTER — Encounter: Payer: Self-pay | Admitting: Cardiology

## 2019-05-29 ENCOUNTER — Ambulatory Visit (INDEPENDENT_AMBULATORY_CARE_PROVIDER_SITE_OTHER): Payer: 59 | Admitting: Cardiology

## 2019-05-29 ENCOUNTER — Other Ambulatory Visit: Payer: Self-pay

## 2019-05-29 VITALS — BP 134/78 | HR 82 | Ht 71.0 in | Wt 258.8 lb

## 2019-05-29 DIAGNOSIS — I2510 Atherosclerotic heart disease of native coronary artery without angina pectoris: Secondary | ICD-10-CM | POA: Diagnosis not present

## 2019-05-29 DIAGNOSIS — E1122 Type 2 diabetes mellitus with diabetic chronic kidney disease: Secondary | ICD-10-CM | POA: Diagnosis not present

## 2019-05-29 DIAGNOSIS — E785 Hyperlipidemia, unspecified: Secondary | ICD-10-CM

## 2019-05-29 DIAGNOSIS — N182 Chronic kidney disease, stage 2 (mild): Secondary | ICD-10-CM | POA: Diagnosis not present

## 2019-05-29 HISTORY — DX: Hyperlipidemia, unspecified: E78.5

## 2019-05-29 NOTE — Patient Instructions (Addendum)
Medication Instructions:  Your physician recommends that you continue on your current medications as directed. Please refer to the Current Medication list given to you today.  *If you need a refill on your cardiac medications before your next appointment, please call your pharmacy*  Lab Work: None.  If you have labs (blood work) drawn today and your tests are completely normal, you will receive your results only by: Marland Kitchen MyChart Message (if you have MyChart) OR . A paper copy in the mail If you have any lab test that is abnormal or we need to change your treatment, we will call you to review the results.  Testing/Procedures: None.   Follow-Up: At [facility], you and your health needs are our priority.  As part of our continuing mission to provide you with exceptional heart care, we have created designated Provider Care Teams.  These Care Teams include your primary Cardiologist (physician) and Advanced Practice Providers (APPs -  Physician Assistants and Nurse Practitioners) who all work together to provide you with the care you need, when you need it.  Your next appointment:   6 month(s)  The format for your next appointment:   In Person  Provider:   You may see Dr. Agustin Cree or the following Advanced Practice Provider on your designated Care Team:    Laurann Montana, FNP   Other Instructions

## 2019-05-29 NOTE — Progress Notes (Signed)
Cardiology Office Note:    Date:  05/29/2019   ID:  James [name], DOB 09-09-1955, MRN [id number]  PCP:  Colon Branch, MD  Cardiologist:  Jenne Campus, MD    Referring MD: Colon Branch, MD   Chief Complaint  Patient presents with  . Follow-up  Doing well  History of Present Illness:    James [name] is a 63 y.o. male with abnormal EKG, stress test showing no evidence of ischemia, echocardiogram showed preserved left ventricle ejection fraction, diabetes, hypertension, dyslipidemia. Comes today to my office for follow-up. Overall doing well, denies have any chest pain, tightness, pressure, burning in the chest. Busy taking care of his family apparently his father recently required pacemaker, his mother required angioplasty of 2 vessels.  Past Medical History:  Diagnosis Date  . Acid reflux   . Arthritis   . Chronic headaches    on cymbalta  . Cirrhosis ([place])   . Colon polyps   . Complication of anesthesia    problems waking up from anesthesia  . Depression    on cymbalta  . Diabetes mellitus with neuropathy ([place])   . Diverticulitis 03/2013  . Eczema   . Elevated LFTs   . Fatty liver   . Hepatitis 10/2017   NASH cirrhosis  . History of kidney stones   . Hypertension   . Insomnia 04/26/2013  . Kidney stone   . Morbid obesity ([place])   . Neuromuscular disorder (HCC)    neuropathy  . Neuropathy   . Post-traumatic hydrocephalus (HCC)    s/p shunts x 2 (first got infected )  . Psoriasis    sees Dr [name]  . Psoriatic arthritis ([place])   . 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 Sleep apnea    no CPAP     . Stomach ulcer   . Testosterone deficiency 04/28/2011    Past Surgical History:  Procedure Laterality Date  . BACK SURGERY  1980  . BRAIN SURGERY     VP shunts placed in 2007  . CHOLECYSTECTOMY N/A 08/25/2017   Procedure: LAPAROSCOPIC CHOLECYSTECTOMY WITH INTRAOPERATIVE CHOLANGIOGRAM;  Surgeon: [name], MD;   Location: [facility];  Service: General;  Laterality: N/A;  . COLONOSCOPY    . JOINT REPLACEMENT     total hip  . LUMBAR FUSION  03/16/2019  . SHOULDER SURGERY Left 2010  . TOE SURGERY Left 2018  . TONSILLECTOMY     as a child  . TOTAL HIP ARTHROPLASTY Left 2011  . VENTRICULOPERITONEAL SHUNT  2007   x2    Current Medications: Current Meds  Medication Sig  . Adalimumab (HUMIRA) 20 MG/0.2ML PSKT Inject into the skin.  Marland Kitchen atorvastatin (LIPITOR) 20 MG tablet Take 1 tablet (20 mg total) by mouth at bedtime.  . Blood Glucose Monitoring Suppl (FREESTYLE FREEDOM LITE) w/Device KIT Use to check blood sugars 3 times daily. Dx code: E11.9  . carvedilol (COREG) 12.5 MG tablet Take 1 tablet (12.5 mg total) by mouth 2 (two) times daily with a meal.  . Continuous Blood Gluc Sensor (FREESTYLE LIBRE 2 SENSOR SYSTM) MISC 2 Units by Does not apply route every 14 (fourteen) days.  . DULoxetine (CYMBALTA) 60 MG capsule Take 2 capsules (120 mg total) by mouth daily. (Patient taking differently: Take 120 mg by mouth at bedtime. )  . EPINEPHrine (EPIPEN 2-PAK) 0.3 mg/0.3 mL IJ SOAJ injection Inject 0.3 mLs (0.3 mg total) into the muscle once as needed  for up to 1 dose.  . fenofibrate micronized (LOFIBRA) 134 MG capsule TAKE 1 CAPSULE BY MOUTH THREE TIMES WEEKLY.  . gabapentin (NEURONTIN) 600 MG tablet Take 1 tablet (600 mg total) by mouth 2 (two) times daily.  . Galcanezumab-gnlm (EMGALITY) 120 MG/ML SOAJ Inject 1 pen into the skin every 28 (twenty-eight) days. (Patient taking differently: Inject 120 mg into the skin every 28 (twenty-eight) days. )  . glimepiride (AMARYL) 1 MG tablet Take 1 tablet (1 mg total) by mouth 2 (two) times daily.  Marland Kitchen glucose blood test strip Use Freestyle Freedom Lite test strips as instructed to check blood sugar 4 times daily.  . hydrocortisone (ANUSOL-HC) 25 MG suppository Place 1 suppository (25 mg total) rectally 2 (two) times daily as needed for hemorrhoids.  . Lancets (FREESTYLE)  lancets Use to check blood sugar 3 times daily. Dx code E11.9  . levETIRAcetam (KEPPRA) 750 MG tablet TAKE 1 TABLET (750 MG TOTAL) BY MOUTH 2 (TWO) TIMES DAILY.  . metFORMIN (GLUCOPHAGE) 500 MG tablet TAKE 2 TABLET BY MOUTH DAILY TWICE DAILY.  Marland Kitchen omeprazole (PRILOSEC) 40 MG capsule Take 1 capsule (40 mg total) by mouth 2 (two) times daily before a meal.  . Semaglutide (RYBELSUS) 14 MG TABS Take 1 tablet by mouth daily. Take 1 tablet by mouth once daily. (Patient taking differently: Take 14 mg by mouth daily before breakfast. )     Allergies:   Bee venom, Hydrocodone-homatropine, Toradol [ketorolac tromethamine], Morphine and related, and Sulfa drugs cross reactors   Social History   Socioeconomic History  . Marital status: Married    Spouse name: [name]  . Number of children: 2  . Years of education: Not on file  . Highest education level: Not on file  Occupational History  . Occupation: disabled   Social Needs  . Financial resource strain: Not on file  . Food insecurity    Worry: Not on file    Inability: Not on file  . Transportation needs    Medical: Not on file    Non-medical: Not on file  Tobacco Use  . Smoking status: Never Smoker  . Smokeless tobacco: Never Used  Substance and Sexual Activity  . Alcohol use: No  . Drug use: No  . Sexual activity: Yes    Partners: Female  Lifestyle  . Physical activity    Days per week: Not on file    Minutes per session: Not on file  . Stress: Not on file  Relationships  . Social Herbalist on phone: Not on file    Gets together: Not on file    Attends religious service: Not on file    Active member of club or organization: Not on file    Attends meetings of clubs or organizations: Not on file    Relationship status: Not on file  Other Topics Concern  . 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     Family History: The patient's family history includes Down syndrome in his son; Heart disease in his brother; Lung cancer in his father; Other in his brother and mother. There is no history of Diabetes, Prostate cancer, Colon cancer, Stomach cancer, Pancreatic cancer, or Liver disease. ROS:   Please see the history of present illness.    All 14  point review of systems negative except as described per history of present illness  EKGs/Labs/Other Studies Reviewed:      Recent Labs: 06/10/2018: TSH 2.34 04/28/2019: ALT 25; BUN 18; Creatinine, Ser 1.35; Hemoglobin 11.9; Platelets 225.0; Potassium 4.8; Sodium 139  Recent Lipid Panel    Component Value Date/Time   CHOL 181 04/28/2019 1000   TRIG 170.0 (H) 04/28/2019 1000   HDL 40.40 04/28/2019 1000   CHOLHDL 4 04/28/2019 1000   VLDL 34.0 04/28/2019 1000   LDLCALC 106 (H) 04/28/2019 1000   LDLDIRECT 89.0 05/03/2018 1048    Physical Exam:    VS:  BP 134/78   Pulse 82   Ht 5' 11" (1.803 m)   Wt 258 lb 12.8 oz (117.4 kg)   SpO2 98%   BMI 36.10 kg/m     Wt Readings from Last 3 Encounters:  05/29/19 258 lb 12.8 oz (117.4 kg)  05/03/19 256 lb 9.6 oz (116.4 kg)  04/24/19 256 lb (116.1 kg)     GEN:  Well nourished, well developed in no acute distress HEENT: Normal NECK: No JVD; No carotid bruits LYMPHATICS: No lymphadenopathy CARDIAC: RRR, no murmurs, no rubs, no gallops RESPIRATORY:  Clear to auscultation without rales, wheezing or rhonchi  ABDOMEN: Soft, non-tender, non-distended MUSCULOSKELETAL:  No edema; No deformity  SKIN: Warm and dry LOWER EXTREMITIES: no swelling NEUROLOGIC:  Alert and oriented x 3 PSYCHIATRIC:  Normal affect   ASSESSMENT:    1. Coronary artery disease involving native coronary artery of native heart without angina pectoris   2. Type 2 diabetes mellitus with stage 2 chronic kidney disease, without long-term current use of insulin ([place])   3.  Dyslipidemia    PLAN:    In order of problems listed above:  1. Coronary disease stable recent stress test negative. 2. Diabetes mellitus doing well from that point review followed by internal medicine team. 3. Dyslipidemia recent fasting lipid profile showed elevation of LDL Lipitor has been increased awaiting results of repeat of fasting lipid profile   Medication Adjustments/Labs and Tests Ordered: Current medicines are reviewed at length with the patient today.  Concerns regarding medicines are outlined above.  No orders of the defined types were placed in this encounter.  Medication changes: No orders of the defined types were placed in this encounter.   Signed, Park Liter, MD, [facility] 05/29/2019 2:37 PM    [place]

## 2019-06-15 MED FILL — HUMALOG MIX 75-25 KWIKPEN: (75-25) 100 | 31 days supply | Qty: 48 | Fill #2

## 2019-06-15 MED FILL — FREESTYLE LIBRE 14 DAY SENS: 28 days supply | Qty: 2 | Fill #1

## 2019-06-15 MED FILL — levETIRAcetam 750 MG TABS: 750 | 30 days supply | Qty: 60 | Fill #8

## 2019-06-15 MED FILL — HUMIRA PEN 40 MG/0.8ML PNKT: 40 | 28 days supply | Qty: 2 | Fill #3

## 2019-06-15 MED FILL — GABAPENTIN 600 MG TABLET: 600 | 30 days supply | Qty: 60 | Fill #5

## 2019-06-15 MED FILL — GLIMEPIRIDE 1 MG TABLET: 1 | 30 days supply | Qty: 60 | Fill #2

## 2019-06-21 ENCOUNTER — Other Ambulatory Visit: Payer: Self-pay | Admitting: Internal Medicine

## 2019-06-21 MED ORDER — OMEPRAZOLE 40 MG PO CPDR: 40.0000 mg | DELAYED_RELEASE_CAPSULE | Freq: Two times a day (BID) | ORAL | 3 refills | Status: DC

## 2019-06-21 MED FILL — OMEPRAZOLE 40 MG CPDR: 40 | 90 days supply | Qty: 180 | Fill #0

## 2019-06-21 MED FILL — BD PEN NDL MINI 31GX5MM: 31G X 5 MM | 30 days supply | Qty: 100 | Fill #0

## 2019-06-21 MED FILL — DULOXETINE HCL 60 MG CPEP: 60 | 30 days supply | Qty: 60 | Fill #5

## 2019-07-10 ENCOUNTER — Other Ambulatory Visit (INDEPENDENT_AMBULATORY_CARE_PROVIDER_SITE_OTHER): Payer: 59

## 2019-07-10 ENCOUNTER — Other Ambulatory Visit: Payer: Self-pay

## 2019-07-10 DIAGNOSIS — E1165 Type 2 diabetes mellitus with hyperglycemia: Secondary | ICD-10-CM | POA: Diagnosis not present

## 2019-07-10 LAB — BASIC METABOLIC PANEL
BUN: 17 mg/dL (ref 6–23)
CO2: 28 mEq/L (ref 19–32)
Calcium: 10 mg/dL (ref 8.4–10.5)
Chloride: 102 mEq/L (ref 96–112)
Creatinine, Ser: 1.46 mg/dL (ref 0.40–1.50)
GFR: 49.03 mL/min — ABNORMAL LOW (ref >60.00)
Glucose, Bld: 128 mg/dL — ABNORMAL HIGH (ref 70–99)
Potassium: 4.5 mEq/L (ref 3.5–5.1)
Sodium: 138 mEq/L (ref 135–145)

## 2019-07-11 LAB — FRUCTOSAMINE: Fructosamine: 375 umol/L — ABNORMAL HIGH (ref 0–285)

## 2019-07-12 ENCOUNTER — Ambulatory Visit (INDEPENDENT_AMBULATORY_CARE_PROVIDER_SITE_OTHER): Payer: 59 | Admitting: Endocrinology

## 2019-07-12 ENCOUNTER — Other Ambulatory Visit: Payer: Self-pay

## 2019-07-12 ENCOUNTER — Encounter: Payer: Self-pay | Admitting: Endocrinology

## 2019-07-12 VITALS — Ht 71.0 in

## 2019-07-12 DIAGNOSIS — E1165 Type 2 diabetes mellitus with hyperglycemia: Secondary | ICD-10-CM

## 2019-07-12 MED ORDER — GLIMEPIRIDE 4 MG PO TABS: 4.0000 mg | ORAL_TABLET | Freq: Every day | ORAL | 3 refills | Status: DC

## 2019-07-12 MED ORDER — INSULIN LISPRO PROT & LISPRO (75-25 MIX) 100 UNIT/ML KWIKPEN: PEN_INJECTOR | SUBCUTANEOUS | 0 refills | Status: DC

## 2019-07-12 MED ORDER — RYBELSUS 7 MG PO TABS: 1.0000 | ORAL_TABLET | Freq: Every day | ORAL | 3 refills | Status: DC

## 2019-07-12 MED FILL — GLIMEPIRIDE 4 MG TABLET: 4 | 30 days supply | Qty: 30 | Fill #0

## 2019-07-12 MED FILL — RYBELSUS 7 MG TABS: 7 | 30 days supply | Qty: 30 | Fill #0

## 2019-07-12 NOTE — Patient Instructions (Signed)
Restart RYBELSUS with 7 mg dose on waking up. Make sure you have a snack at least about 30 minutes later HUMALOG mix insulin: Take at least 30-35 units in the morning with a snack regardless of blood sugar Increase the dose at lunchtime to 60 units and go up to 70 units if blood sugars are still over 200 at dinnertime Change GLIMEPIRIDE to 4 mg daily in the morning, new prescription has been sent. Avoid any foods with high fat content or sugar Start walking regularly for exercise at least 15 to 20 minutes a day

## 2019-07-12 NOTE — Progress Notes (Signed)
Patient ID: James [name], male   DOB: [birth year], 64 y.o.   MRN: [id number]           Reason for Appointment: Follow-up for Type 2 Diabetes  I connected with the above-named patient by video enabled telemedicine application and verified that I am speaking with the correct person. The patient was explained the limitations of evaluation and management by telemedicine and the availability of in person appointments.  Patient also understood that there may be a patient responsible charge related to this service . Location of the patient: Patient's home . Location of the provider: Physician office Only the patient and myself were participating in the encounter The patient understood the above statements and agreed to proceed.   History of Present Illness:          Date of diagnosis of type 2 diabetes mellitus:  ?  2014      Background history:  He is not clear how his diabetes was diagnosed, likely on routine testing. Initially had been treated with metformin and also tried on Amaryl Patient had progressive increase in his blood sugars since 1/17 with stopping metformin and being on the regimen of Amaryl and Januvia, he thinks his blood sugars went up to 601.  He was then started on basal bolus insulin   Lowest A1c was 6.8 previously  Recent history:    Non-insulin hypoglycemic drugs the patient is taking are: Rybelsus 14 mg: Not taking, Amaryl 42m bedtime, metformin 1000 mg twice daily  INSULIN regimen: Humalog mix 75/25: Usually 50 at lunch and 60 at dinner  His A1c is last improved to 7.6, and was 9.2 Fructosamine is 375, was 301   Current management, blood sugar patterns and problems identified:   He had been told to start back on Humalog mix on his last visit because of rising blood sugars especially nonfasting  Also started on the freestyle libre on his last visit about 2 months ago  He now says that he stopped taking Rybelsus 14 mg daily because it was making him  nauseated but he did not let uKoreaknow; also previously had been taking it without any reported side effects  He has continued to increase his insulin dose at home but blood sugars are not improving  However he only takes insulin pump based on his premeal blood sugar frequently not taking any insulin in the morning  With this his blood sugars are rising sharply in the midmorning and continued to rise into late at night  He does not do any exercise  He thinks he is avoiding fried food and usually restricting high sugar drinks along with relatively small portions  Not losing weight, recently about 260  Side  effects from medications have been: None  Compliance with the medical regimen: Fair  Glucose monitoring:  done up to 5 times a day         Glucometer:  Freestyle libre Blood Glucose readings    CONTINUOUS GLUCOSE MONITORING RECORD INTERPRETATION    Dates of Recording: 12/29 through 07/10/19 Sensor description: [employer] Results statistics:   CGM use % of time  87  Average and SD  231 9, GV 37  Time in range     34   %  % Time Above 180  23  % Time above 250  43  % Time Below target  0    PRE-MEAL Fasting Lunch Dinner Bedtime Overall  Glucose range:       Mean/median:  132   256  282   231   POST-MEAL PC Breakfast PC Lunch PC Dinner  Glucose range:     Mean/median: 167  286  311    Glycemic patterns summary: Blood sugars from the [place] are slightly lower than the lab glucose His blood sugars are showing significant hyperglycemia at all times except early morning No hypoglycemia HIGHEST blood sugars are after 10 PM until 2 AM  Hyperglycemic episodes are occurring during the day with blood sugars rising significantly after 10 AM and continuing to rise later in the day especially after 10 PM  Hypoglycemic episodes have not occurred  Overnight periods: Blood sugars declining after about midnight when they are averaging about 330 and declining until about 9 AM  when they are about 130  Preprandial periods: Blood sugars are mildly increased around 130 in the early mornings Blood sugars at noon are averaging 250 and around dinnertime 280  Postprandial periods:   After breakfast: Blood sugars are rising significantly after about 10 AM although he may not be eating a meal in the morning  After lunch:   Blood sugars continue to rise after the morning increase and plateau around 2-4 PM with blood sugars around 280  After dinner: Blood sugars are variably high but in the upper 200 range fairly consistently   Previous readings  PRE-MEAL Fasting Lunch Dinner Bedtime Overall  Glucose range:  128-178  106, 189  138-268    Mean/median:  157   195   166    Self-care:   Meal times are:  Breakfast variable, lunch: 2-3 PM Dinner: 5-6 PM    Dietician visit, most recent:09/2015               CDE consultation: 12/2018  Weight history:  Wt Readings from Last 3 Encounters:  05/29/19 258 lb 12.8 oz (117.4 kg)  05/03/19 256 lb 9.6 oz (116.4 kg)  04/24/19 256 lb (116.1 kg)    Glycemic control:   Lab Results  Component Value Date   HGBA1C 7.6 (H) 04/28/2019   HGBA1C 9.2 (H) 03/08/2019   HGBA1C 11.0 (A) 12/28/2018   Lab Results  Component Value Date   MICROALBUR 3.4 (H) 12/28/2018   LDLCALC 106 (H) 04/28/2019   CREATININE 1.46 07/10/2019    Lab on 07/10/2019  Component Date Value Ref Range Status  . Fructosamine 07/10/2019 375* 0 - 285 umol/L Final   Comment: Published reference interval for apparently healthy subjects between age 43 and 21 is 65 - 285 umol/L and in a poorly controlled diabetic population is 228 - 563 umol/L with a mean of 396 umol/L.   Marland Kitchen Sodium 07/10/2019 138  135 - 145 mEq/L Final  . Potassium 07/10/2019 4.5  3.5 - 5.1 mEq/L Final  . Chloride 07/10/2019 102  96 - 112 mEq/L Final  . CO2 07/10/2019 28  19 - 32 mEq/L Final  . Glucose, Bld 07/10/2019 128* 70 - 99 mg/dL Final  . BUN 07/10/2019 17  6 - 23 mg/dL Final  .  Creatinine, Ser 07/10/2019 1.46  0.40 - 1.50 mg/dL Final  . GFR 07/10/2019 49.03* >60.00 mL/min Final  . Calcium 07/10/2019 10.0  8.4 - 10.5 mg/dL Final       Allergies as of 07/12/2019      Reactions   Bee Venom Anaphylaxis   Hydrocodone-homatropine Other (See Comments)   Hallucinations, confusion, delirium Depressed feeling   Toradol [ketorolac Tromethamine] Other (See Comments)   Hallucinations, confusion, delirium   Morphine  And Related Other (See Comments)   Hallucinations, back in the 80s. States has taken vicodin before w/o problems    Sulfa Drugs Cross Reactors Rash      Medication List       Accurate as of July 12, 2019  1:20 PM. If you have any questions, ask your nurse or doctor.        atorvastatin 20 MG tablet Commonly known as: LIPITOR Take 1 tablet (20 mg total) by mouth at bedtime.   carvedilol 12.5 MG tablet Commonly known as: COREG Take 1 tablet (12.5 mg total) by mouth 2 (two) times daily with a meal.   DULoxetine 60 MG capsule Commonly known as: CYMBALTA Take 2 capsules (120 mg total) by mouth daily. What changed: when to take this   EPINEPHrine 0.3 mg/0.3 mL Soaj injection Commonly known as: EpiPen 2-Pak Inject 0.3 mLs (0.3 mg total) into the muscle once as needed for up to 1 dose.   fenofibrate micronized 134 MG capsule Commonly known as: LOFIBRA TAKE 1 CAPSULE BY MOUTH THREE TIMES WEEKLY.   FreeStyle Freedom Lite w/Device Kit Use to check blood sugars 3 times daily. Dx code: E11.9   freestyle lancets Use to check blood sugar 3 times daily. Dx code E11.9   FreeStyle Libre 2 Sensor Systm Misc 2 Units by Does not apply route every 14 (fourteen) days.   gabapentin 600 MG tablet Commonly known as: NEURONTIN Take 1 tablet (600 mg total) by mouth 2 (two) times daily.   Galcanezumab-gnlm 120 MG/ML Soaj Commonly known as: Emgality Inject 1 pen into the skin every 28 (twenty-eight) days. What changed: how much to take   glimepiride 4  MG tablet Commonly known as: AMARYL Take 1 tablet (4 mg total) by mouth daily before breakfast. What changed:   medication strength  how much to take  when to take this Changed by: Elayne Snare, MD   glucose blood test strip Use Freestyle Freedom Lite test strips as instructed to check blood sugar 4 times daily.   Humira 20 MG/0.2ML Pskt Generic drug: Adalimumab Inject into the skin.   hydrocortisone 25 MG suppository Commonly known as: ANUSOL-HC Place 1 suppository (25 mg total) rectally 2 (two) times daily as needed for hemorrhoids.   Insulin Lispro Prot & Lispro (75-25) 100 UNIT/ML Kwikpen Commonly known as: HumaLOG Mix 75/25 KwikPen 40 units before breakfast, 60 before lunch and 60 units before dinner Started by: Elayne Snare, MD   levETIRAcetam 750 MG tablet Commonly known as: KEPPRA TAKE 1 TABLET (750 MG TOTAL) BY MOUTH 2 (TWO) TIMES DAILY.   metFORMIN 500 MG tablet Commonly known as: GLUCOPHAGE TAKE 2 TABLET BY MOUTH DAILY TWICE DAILY.   omeprazole 40 MG capsule Commonly known as: PRILOSEC Take 1 capsule (40 mg total) by mouth 2 (two) times daily before a meal.   Rybelsus 7 MG Tabs Generic drug: Semaglutide Take 1 tablet by mouth daily before breakfast. Take 30 minutes before breakfast with water What changed:   medication strength  when to take this  additional instructions Changed by: Elayne Snare, MD       Allergies:  Allergies  Allergen Reactions  . Bee Venom Anaphylaxis  . Hydrocodone-Homatropine Other (See Comments)    Hallucinations, confusion, delirium Depressed feeling  . Toradol [Ketorolac Tromethamine] Other (See Comments)    Hallucinations, confusion, delirium  . Morphine And Related Other (See Comments)    Hallucinations, back in the 80s. States has taken vicodin before w/o problems   . Sulfa  Drugs Cross Reactors Rash    Past Medical History:  Diagnosis Date  . Acid reflux   . Arthritis   . Chronic headaches    on cymbalta  .  Cirrhosis ([place])   . Colon polyps   . Complication of anesthesia    problems waking up from anesthesia  . Depression    on cymbalta  . Diabetes mellitus with neuropathy ([place])   . Diverticulitis 03/2013  . Eczema   . Elevated LFTs   . Fatty liver   . Hepatitis 10/2017   NASH cirrhosis  . History of kidney stones   . Hypertension   . Insomnia 04/26/2013  . Kidney stone   . Morbid obesity ([place])   . Neuromuscular disorder (HCC)    neuropathy  . Neuropathy   . Post-traumatic hydrocephalus (H[facility])    s/p shunts x 2 (first got infected )  . Psoriasis    sees Dr [name]  . Psoriatic arthritis ([place])   . 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 Sleep apnea    no CPAP     . Stomach ulcer   . Testosterone deficiency 04/28/2011    Past Surgical History:  Procedure Laterality Date  . BACK SURGERY  1980  . BRAIN SURGERY     VP shunts placed in 2007  . CHOLECYSTECTOMY N/A 08/25/2017   Procedure: LAPAROSCOPIC CHOLECYSTECTOMY WITH INTRAOPERATIVE CHOLANGIOGRAM;  Surgeon: [name], MD;  Location: [facility];  Service: General;  Laterality: N/A;  . COLONOSCOPY    . JOINT REPLACEMENT     total hip  . LUMBAR FUSION  03/16/2019  . SHOULDER SURGERY Left 2010  . TOE SURGERY Left 2018  . TONSILLECTOMY     as a child  . TOTAL HIP ARTHROPLASTY Left 2011  . VENTRICULOPERITONEAL SHUNT  2007   x2    Family History  Problem Relation Age of Onset  . Other Mother   . Lung cancer Father        alive, former smoker   . Heart disease Brother        MI age 92  . Other Brother        Murdered  . Down syndrome Son   . Diabetes Neg Hx   . Prostate cancer Neg Hx   . Colon cancer Neg Hx   . Stomach cancer Neg Hx   . Pancreatic cancer Neg Hx   . Liver disease Neg Hx     Social History:  reports that he has never smoked. He has never used smokeless tobacco. He reports that he does not drink alcohol or use drugs.    Review of Systems     HYPERTENSION: On  carvedilol for some time Followed by PCP   BP Readings from Last 3 Encounters:  05/29/19 134/78  05/03/19 140/86  04/24/19 136/79   RENAL dysfunction: Previously unclear why creatinine has fluctuated but it is relatively stable now  Lab Results  Component Value Date   CREATININE 1.46 07/10/2019   CREATININE 1.35 04/28/2019   CREATININE 1.31 04/28/2019    Lipid history: He is on Lipitor  20 mg, last LDL over 100 and needs follow-up  Is on fenofibrate for triglyceride treatment, has increased triglycerides with results as below      Lab Results  Component Value Date   CHOL 181 04/28/2019   HDL 40.40 04/28/2019   LDLCALC 106 (H) 04/28/2019   LDLDIRECT 89.0 05/03/2018   TRIG 170.0 (H) 04/28/2019  CHOLHDL 4 04/28/2019            Most recent foot exam: 08/2017  He has symptomatic painful neuropathy and is on gabapentin 600 mg twice a day and  Cymbalta 60 mg  Has been followed by neurologist    Review of Systems    Physical Examination:  Ht 5' 11" (1.803 m)   BMI 36.10 kg/m       ASSESSMENT:  Diabetes type 2 with obesity  See history of present illness for discussion of current diabetes management, blood sugar patterns and problems identified  Currently on a regimen of premixed insulin, Metformin and low-dose Amaryl  A1c was last 7.7  However fructosamine is 375  He has been back on insulin but his blood sugar continues to be getting more difficult to control despite taking over 100 units of insulin a day He may have been benefiting from Rybelsus but he stopped this on his own because of nausea from 14 mg Metformin was also increased previously  PLAN:   Instructions given on my chart message He agrees to try Rybelsus at a low dose of 7 mg but needs to take a snack about 30 minutes later  If he has nausea with this he can go down to 3 mg temporarily Advised him to avoid any high fat meals Start walking regularly Take his insulin in the morning  despite blood sugars being normal and can have a small snack Need to increase his insulin at least at lunchtime by 10 units and subsequently another 10 units if blood sugars continue to be high Follow-up in 1 month Check A1c on the next visit   Patient Instructions  Restart RYBELSUS with 7 mg dose on waking up. Make sure you have a snack at least about 30 minutes later HUMALOG mix insulin: Take at least 30-35 units in the morning with a snack regardless of blood sugar Increase the dose at lunchtime to 60 units and go up to 70 units if blood sugars are still over 200 at dinnertime Change GLIMEPIRIDE to 4 mg daily in the morning, new prescription has been sent. Avoid any foods with high fat content or sugar Start walking regularly for exercise at least 15 to 20 minutes a day      Elayne Snare 07/12/2019, 1:20 PM

## 2019-07-14 ENCOUNTER — Other Ambulatory Visit: Payer: Self-pay | Admitting: Endocrinology

## 2019-07-14 ENCOUNTER — Other Ambulatory Visit: Payer: Self-pay | Admitting: Internal Medicine

## 2019-07-14 MED FILL — GABAPENTIN 600 MG TABLET: 600 | 30 days supply | Qty: 60 | Fill #0

## 2019-07-14 MED FILL — FENOFIBRATE 134 MG CAPSULE: 134 | 5 days supply | Qty: 15 | Fill #2

## 2019-07-14 MED FILL — levETIRAcetam 750 MG TABS: 750 | 30 days supply | Qty: 60 | Fill #9

## 2019-07-14 MED FILL — FREESTYLE LIBRE 14 DAY SENS: 28 days supply | Qty: 2 | Fill #2

## 2019-07-18 MED FILL — HUMIRA PEN 40 MG/0.8ML PNKT: 40 | 28 days supply | Qty: 2 | Fill #0

## 2019-07-26 ENCOUNTER — Other Ambulatory Visit: Payer: Self-pay | Admitting: Internal Medicine

## 2019-07-26 MED FILL — DULOXETINE HCL 60 MG CPEP: 60 | 30 days supply | Qty: 60 | Fill #0

## 2019-07-28 MED FILL — FENOFIBRATE 134 MG CAPSULE: 134 | 5 days supply | Qty: 15 | Fill #3

## 2019-08-10 ENCOUNTER — Other Ambulatory Visit: Payer: Self-pay | Admitting: Pharmacist

## 2019-08-10 DIAGNOSIS — L409 Psoriasis, unspecified: Secondary | ICD-10-CM | POA: Diagnosis not present

## 2019-08-10 DIAGNOSIS — Z79899 Other long term (current) drug therapy: Secondary | ICD-10-CM | POA: Diagnosis not present

## 2019-08-10 DIAGNOSIS — L219 Seborrheic dermatitis, unspecified: Secondary | ICD-10-CM | POA: Diagnosis not present

## 2019-08-10 DIAGNOSIS — Z23 Encounter for immunization: Secondary | ICD-10-CM | POA: Diagnosis not present

## 2019-08-10 MED ORDER — HUMIRA PEN 40 MG/0.8ML ~~LOC~~ PNKT: 40.0000 mg | PEN_INJECTOR | SUBCUTANEOUS | 5 refills | Status: DC

## 2019-08-10 MED FILL — GABAPENTIN 600 MG TABLET: 600 | 30 days supply | Qty: 60 | Fill #1

## 2019-08-10 MED FILL — GLIMEPIRIDE 4 MG TABLET: 4 | 30 days supply | Qty: 30 | Fill #1

## 2019-08-10 MED FILL — RYBELSUS 7 MG TABS: 7 | 30 days supply | Qty: 30 | Fill #1

## 2019-08-10 MED FILL — levETIRAcetam 750 MG TABS: 750 | 30 days supply | Qty: 60 | Fill #10

## 2019-08-10 MED FILL — FREESTYLE LIBRE 14 DAY SENS: 28 days supply | Qty: 2 | Fill #3

## 2019-08-10 MED FILL — CLOBETASOL PROPIONATE 0.05: 0.05 | 10 days supply | Qty: 45 | Fill #0

## 2019-08-11 MED FILL — metFORMIN HCL 500 MG TABS: 500 | 30 days supply | Qty: 120 | Fill #1

## 2019-08-14 DIAGNOSIS — M545 Low back pain: Secondary | ICD-10-CM | POA: Diagnosis not present

## 2019-08-14 DIAGNOSIS — S39012A Strain of muscle, fascia and tendon of lower back, initial encounter: Secondary | ICD-10-CM | POA: Diagnosis not present

## 2019-08-14 DIAGNOSIS — M533 Sacrococcygeal disorders, not elsewhere classified: Secondary | ICD-10-CM | POA: Diagnosis not present

## 2019-08-14 DIAGNOSIS — M5416 Radiculopathy, lumbar region: Secondary | ICD-10-CM | POA: Diagnosis not present

## 2019-08-14 DIAGNOSIS — S3992XA Unspecified injury of lower back, initial encounter: Secondary | ICD-10-CM

## 2019-08-14 HISTORY — DX: Sacrococcygeal disorders, not elsewhere classified: M53.3

## 2019-08-14 HISTORY — DX: Unspecified injury of lower back, initial encounter: S39.92XA

## 2019-08-16 MED FILL — HUMIRA PEN 40 MG/0.8ML PNKT: 40 | 28 days supply | Qty: 2 | Fill #1

## 2019-08-22 ENCOUNTER — Other Ambulatory Visit: Payer: Self-pay | Admitting: Internal Medicine

## 2019-08-22 MED FILL — DULoxetine HCL 60 MG CPEP: 60 | 30 days supply | Qty: 60 | Fill #1

## 2019-08-22 MED FILL — CARVEDILOL 12.5 MG TABLET: 12.5 | 90 days supply | Qty: 180 | Fill #0

## 2019-09-03 ENCOUNTER — Ambulatory Visit: Payer: 59 | Attending: Internal Medicine

## 2019-09-03 DIAGNOSIS — Z23 Encounter for immunization: Secondary | ICD-10-CM | POA: Insufficient documentation

## 2019-09-03 NOTE — Progress Notes (Signed)
   Covid-19 Vaccination Clinic  Name:  James [name]    MRN: [id number] DOB: 03/11/1956  09/03/2019  [name] was observed post Covid-19 immunization for 30 minutes based on pre-vaccination screening without incident. He was provided with Vaccine Information Sheet and instruction to access the V-Safe system.   [name] was instructed to call 911 with any severe reactions post vaccine: Marland Kitchen Difficulty breathing  . Swelling of face and throat  . A fast heartbeat  . A bad rash all over body  . Dizziness and weakness   Immunizations Administered    Name Date Dose VIS Date Route   Pfizer COVID-19 Vaccine 09/03/2019  1:36 PM 0.3 mL 06/09/2019 Intramuscular   Manufacturer: [place]   Lot: [id number]   [place]: [id number]

## 2019-09-08 ENCOUNTER — Other Ambulatory Visit: Payer: Self-pay | Admitting: Endocrinology

## 2019-09-11 ENCOUNTER — Other Ambulatory Visit: Payer: Self-pay | Admitting: Endocrinology

## 2019-09-11 MED FILL — ATORVASTATIN 20 MG TABLET: 20 | 90 days supply | Qty: 90 | Fill #1

## 2019-09-11 MED FILL — metFORMIN HCL 500 MG TABS: 500 | 30 days supply | Qty: 120 | Fill #0

## 2019-09-11 MED FILL — BD PEN NDL MINI 31GX5MM: 31G X 5 MM | 30 days supply | Qty: 100 | Fill #1

## 2019-09-11 MED FILL — levETIRAcetam 750 MG TABS: 750 | 30 days supply | Qty: 60 | Fill #11

## 2019-09-11 MED FILL — FREESTYLE LIBRE 14 DAY SENS: 28 days supply | Qty: 2 | Fill #0

## 2019-09-11 MED FILL — HUMALOG MIX 75-25 KWIKPEN: (75-25) 100 | 31 days supply | Qty: 48 | Fill #3

## 2019-09-11 MED FILL — GABAPENTIN 600 MG TABLET: 600 | 30 days supply | Qty: 60 | Fill #2

## 2019-09-13 MED FILL — HUMIRA PEN 40 MG/0.8ML PNKT: 40 | 28 days supply | Qty: 2 | Fill #0

## 2019-09-21 MED FILL — DULoxetine HCL 60 MG CPEP: 60 | 30 days supply | Qty: 60 | Fill #2

## 2019-09-26 ENCOUNTER — Ambulatory Visit: Payer: 59 | Attending: Internal Medicine

## 2019-09-26 DIAGNOSIS — Z23 Encounter for immunization: Secondary | ICD-10-CM

## 2019-09-26 NOTE — Progress Notes (Signed)
   Covid-19 Vaccination Clinic  Name:  James [name]    MRN: [id number] DOB: 1955/09/20  09/26/2019  [name] was observed post Covid-19 immunization for 30 minutes based on pre-vaccination screening without incident. He was provided with Vaccine Information Sheet and instruction to access the V-Safe system.   [name] was instructed to call 911 with any severe reactions post vaccine: Marland Kitchen Difficulty breathing  . Swelling of face and throat  . A fast heartbeat  . A bad rash all over body  . Dizziness and weakness   Immunizations Administered    Name Date Dose VIS Date Route   Pfizer COVID-19 Vaccine 09/26/2019  1:35 PM 0.3 mL 06/09/2019 Intramuscular   Manufacturer: [employer]   Lot: [id number]   [place]: [id number]

## 2019-10-04 ENCOUNTER — Ambulatory Visit: Payer: 59

## 2019-10-06 ENCOUNTER — Other Ambulatory Visit: Payer: Self-pay

## 2019-10-06 MED FILL — FREESTYLE LIBRE 14 DAY SENS: 28 days supply | Qty: 2 | Fill #1

## 2019-10-09 ENCOUNTER — Encounter: Payer: Self-pay | Admitting: Internal Medicine

## 2019-10-09 ENCOUNTER — Ambulatory Visit (INDEPENDENT_AMBULATORY_CARE_PROVIDER_SITE_OTHER): Payer: 59 | Admitting: Internal Medicine

## 2019-10-09 ENCOUNTER — Other Ambulatory Visit: Payer: Self-pay

## 2019-10-09 VITALS — BP 144/81 | HR 78 | Temp 96.7°F | Resp 18 | Ht 71.0 in | Wt 269.4 lb

## 2019-10-09 DIAGNOSIS — G44209 Tension-type headache, unspecified, not intractable: Secondary | ICD-10-CM

## 2019-10-09 DIAGNOSIS — D500 Iron deficiency anemia secondary to blood loss (chronic): Secondary | ICD-10-CM

## 2019-10-09 DIAGNOSIS — G932 Benign intracranial hypertension: Secondary | ICD-10-CM | POA: Diagnosis not present

## 2019-10-09 DIAGNOSIS — E785 Hyperlipidemia, unspecified: Secondary | ICD-10-CM

## 2019-10-09 DIAGNOSIS — N1831 Chronic kidney disease, stage 3a: Secondary | ICD-10-CM | POA: Diagnosis not present

## 2019-10-09 DIAGNOSIS — I10 Essential (primary) hypertension: Secondary | ICD-10-CM | POA: Diagnosis not present

## 2019-10-09 LAB — COMPREHENSIVE METABOLIC PANEL
ALT: 24 U/L (ref 0–53)
AST: 24 U/L (ref 0–37)
Albumin: 4.1 g/dL (ref 3.5–5.2)
Alkaline Phosphatase: 100 U/L (ref 39–117)
BUN: 28 mg/dL — ABNORMAL HIGH (ref 6–23)
CO2: 23 mEq/L (ref 19–32)
Calcium: 9.5 mg/dL (ref 8.4–10.5)
Chloride: 104 mEq/L (ref 96–112)
Creatinine, Ser: 1.5 mg/dL (ref 0.40–1.50)
GFR: 47.48 mL/min — ABNORMAL LOW (ref >60.00)
Glucose, Bld: 182 mg/dL — ABNORMAL HIGH (ref 70–99)
Potassium: 4.8 mEq/L (ref 3.5–5.1)
Sodium: 137 mEq/L (ref 135–145)
Total Bilirubin: 0.7 mg/dL (ref 0.2–1.2)
Total Protein: 7.2 g/dL (ref 6.0–8.3)

## 2019-10-09 LAB — CBC WITH DIFFERENTIAL/PLATELET
Basophils Absolute: 0 10*3/uL (ref 0.0–0.1)
Basophils Relative: 0.5 % (ref 0.0–3.0)
Eosinophils Absolute: 0.3 10*3/uL (ref 0.0–0.7)
Eosinophils Relative: 5.9 % — ABNORMAL HIGH (ref 0.0–5.0)
HCT: 34.3 % — ABNORMAL LOW (ref 39.0–52.0)
Hemoglobin: 11.1 g/dL — ABNORMAL LOW (ref 13.0–17.0)
Lymphocytes Relative: 31.5 % (ref 12.0–46.0)
Lymphs Abs: 1.4 10*3/uL (ref 0.7–4.0)
MCHC: 32.3 g/dL (ref 30.0–36.0)
MCV: 82.5 fl (ref 78.0–100.0)
Monocytes Absolute: 0.4 10*3/uL (ref 0.1–1.0)
Monocytes Relative: 8 % (ref 3.0–12.0)
Neutro Abs: 2.5 10*3/uL (ref 1.4–7.7)
Neutrophils Relative %: 54.1 % (ref 43.0–77.0)
Platelets: 134 10*3/uL — ABNORMAL LOW (ref 150.0–400.0)
RBC: 4.16 Mil/uL — ABNORMAL LOW (ref 4.22–5.81)
RDW: 18.9 % — ABNORMAL HIGH (ref 11.5–15.5)
WBC: 4.6 10*3/uL (ref 4.0–10.5)

## 2019-10-09 LAB — LIPID PANEL
Cholesterol: 172 mg/dL (ref 0–200)
HDL: 30.9 mg/dL — ABNORMAL LOW (ref >39.00)
NonHDL: 140.68
Total CHOL/HDL Ratio: 6
Triglycerides: 283 mg/dL — ABNORMAL HIGH (ref 0.0–149.0)
VLDL: 56.6 mg/dL — ABNORMAL HIGH (ref 0.0–40.0)

## 2019-10-09 LAB — LDL CHOLESTEROL, DIRECT: Direct LDL: 83 mg/dL

## 2019-10-09 MED FILL — HUMIRA PEN 40 MG/0.8ML PNKT: 40 | 28 days supply | Qty: 2 | Fill #1

## 2019-10-09 NOTE — Progress Notes (Signed)
Pre visit review using our clinic review tool, if applicable. No additional management support is needed unless otherwise documented below in the visit note. 

## 2019-10-09 NOTE — Patient Instructions (Addendum)
Per our records you are due for an eye exam. Please contact your eye doctor to schedule an appointment. Please have them send copies of your office visit notes to [place]. Our fax number is [phone].  Please check your blood pressure to 3 times a week, keep a log, let me know your readings in 2 weeks. BP GOAL is between 110/65 and  135/85. If it is consistently higher or lower, let me know  For the headaches: If you have severe or unusual headache please go to the ER.  We are referring you back to Dr. Felecia Shelling  GO TO THE LAB : Get the blood work     [place], [place] back for   a checkup in 3 months

## 2019-10-09 NOTE — Progress Notes (Signed)
Subjective:    Patient ID: James [name], male    DOB: 01/27/1956, 64 y.o.   MRN: [id number]  DOS:  10/09/2019 Type of visit - description: Acute His concerns are hypertension and headaches. Few days ago he started to having slightly more frequent headaches.  They are not different to previous headaches, they are not the worst of his life.   Because of that he started to check his blood pressures ~3 days ago, he provided me w/ two reading: 171/107, another 140/90.  Notes from nephrology and neurology reviewed.   BP Readings from Last 3 Encounters:  10/09/19 (!) 144/81  05/29/19 134/78  05/03/19 140/86    Review of Systems Denies nausea, vomiting.  No diarrhea or blood in the stools. Past Medical History:  Diagnosis Date  . Acid reflux   . Arthritis   . Chronic headaches    on cymbalta  . Cirrhosis ([place])   . Colon polyps   . Complication of anesthesia    problems waking up from anesthesia  . Depression    on cymbalta  . Diabetes mellitus with neuropathy ([place])   . Diverticulitis 03/2013  . Eczema   . Elevated LFTs   . Fatty liver   . Hepatitis 10/2017   NASH cirrhosis  . History of kidney stones   . Hypertension   . Insomnia 04/26/2013  . Kidney stone   . Morbid obesity ([place])   . Neuromuscular disorder ([facility])    neuropathy  . Neuropathy   . Post-traumatic hydrocephalus ([facility])    s/p shunts x 2 (first got infected )  . Psoriasis    sees Dr [name]  . Psoriatic arthritis ([place])   . 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 Sleep apnea    no CPAP     . Stomach ulcer   . Testosterone deficiency 04/28/2011    Past Surgical History:  Procedure Laterality Date  . BACK SURGERY  1980  . BRAIN SURGERY     VP shunts placed in 2007  . CHOLECYSTECTOMY N/A 08/25/2017   Procedure: LAPAROSCOPIC CHOLECYSTECTOMY WITH INTRAOPERATIVE CHOLANGIOGRAM;  Surgeon: [name], MD;  Location: [facility];  Service: General;  Laterality: N/A;  .  COLONOSCOPY    . JOINT REPLACEMENT     total hip  . LUMBAR FUSION  03/16/2019  . SHOULDER SURGERY Left 2010  . TOE SURGERY Left 2018  . TONSILLECTOMY     as a child  . TOTAL HIP ARTHROPLASTY Left 2011  . VENTRICULOPERITONEAL SHUNT  2007   x2    Allergies as of 10/09/2019      Reactions   Bee Venom Anaphylaxis   Hydrocodone-homatropine Other (See Comments)   Hallucinations, confusion, delirium Depressed feeling   Toradol [ketorolac Tromethamine] Other (See Comments)   Hallucinations, confusion, delirium   Morphine And Related Other (See Comments)   Hallucinations, back in the 80s. States has taken vicodin before w/o problems    Sulfa Drugs Cross Reactors Rash      Medication List       Accurate as of October 09, 2019 11:59 PM. If you have any questions, ask your nurse or doctor.        atorvastatin 20 MG tablet Commonly known as: LIPITOR Take 1 tablet (20 mg total) by mouth at bedtime.   carvedilol 12.5 MG tablet Commonly known as: COREG Take 1 tablet (12.5 mg total) by mouth 2 (two) times daily with a meal.  DULoxetine 60 MG capsule Commonly known as: CYMBALTA Take 2 capsules (120 mg total) by mouth daily.   EPINEPHrine 0.3 mg/0.3 mL Soaj injection Commonly known as: EpiPen 2-Pak Inject 0.3 mLs (0.3 mg total) into the muscle once as needed for up to 1 dose.   fenofibrate micronized 134 MG capsule Commonly known as: LOFIBRA TAKE 1 CAPSULE BY MOUTH THREE TIMES WEEKLY.   FreeStyle Freedom Lite w/Device Kit Use to check blood sugars 3 times daily. Dx code: E11.9   freestyle lancets Use to check blood sugar 3 times daily. Dx code E11.9   FreeStyle Libre 14 Day Sensor Misc USE AS DIRECTED TO CHECK BLOOD SUGAR   gabapentin 600 MG tablet Commonly known as: NEURONTIN TAKE 1 TABLET BY MOUTH TWICE DAILY   Galcanezumab-gnlm 120 MG/ML Soaj Commonly known as: Emgality Inject 1 pen into the skin every 28 (twenty-eight) days. What changed: how much to take     glimepiride 4 MG tablet Commonly known as: AMARYL Take 1 tablet (4 mg total) by mouth daily before breakfast.   glucose blood test strip Use Freestyle Freedom Lite test strips as instructed to check blood sugar 4 times daily.   Humira Pen 40 MG/0.8ML Pnkt Generic drug: Adalimumab Inject 40 mg into the skin every 14 (fourteen) days.   hydrocortisone 25 MG suppository Commonly known as: ANUSOL-HC Place 1 suppository (25 mg total) rectally 2 (two) times daily as needed for hemorrhoids.   Insulin Lispro Prot & Lispro (75-25) 100 UNIT/ML Kwikpen Commonly known as: HumaLOG Mix 75/25 KwikPen 40 units before breakfast, 60 before lunch and 60 units before dinner   levETIRAcetam 750 MG tablet Commonly known as: KEPPRA TAKE 1 TABLET (750 MG TOTAL) BY MOUTH 2 (TWO) TIMES DAILY.   metFORMIN 500 MG tablet Commonly known as: GLUCOPHAGE TAKE 2 TABLETS BY MOUTH TWICE DAILY.   omeprazole 40 MG capsule Commonly known as: PRILOSEC Take 1 capsule (40 mg total) by mouth 2 (two) times daily before a meal.   Rybelsus 7 MG Tabs Generic drug: Semaglutide Take 1 tablet by mouth daily before breakfast. Take 30 minutes before breakfast with water          Objective:   Physical Exam BP (!) 144/81 (BP Location: Left Arm, Patient Position: Sitting, Cuff Size: Normal)   Pulse 78   Temp (!) 96.7 F (35.9 C) (Temporal)   Resp 18   Ht _0  (1.803 m)   Wt 269 lb 6 oz (122.2 kg)   SpO2 97%   BMI 37.57 kg/m  General:   Well developed, NAD, BMI noted.  HEENT:  Normocephalic . Face symmetric, atraumatic Lungs:  CTA B Normal respiratory effort, no intercostal retractions, no accessory muscle use. Heart: RRR,  no murmur.  Abdomen:  Not distended, soft, non-tender. No rebound or rigidity.   Skin: Not pale. Not jaundice Lower extremities: no pretibial edema bilaterally  Neurologic:  alert & oriented X3.  Speech normal, gait appropriate for age and unassisted Psych--  Cognition and judgment  appear intact.  Cooperative with normal attention span and concentration.  Behavior appropriate. No anxious or depressed appearing.     Assessment     Assessment  DM - Dr [name] Neuropathy (x years, rx gaba 05-2014, w/u 11-2014  RPR neg, vit D-B12-Folic Acid wnl ); Saw Dr Posey Pronto, [facility] [phone] (see results) HTN: History of AKI with ARBs  CRI, sees nephrology, etiology felt to beHyperglycemia, intermittent NSAIDs, contrast exposures Hyperlipidemia (TG in the 500s 2016) OSA , dx 2012, sleep  study again 02-2015 Dr Dohmeier--> severe OSA, intolerant to CPAP Depression, insomnia: on Cymbalta NEURO: --Chronic headaches :on Cymbalta  --Posttraumatic hydrocephalus s/p 2 shunts (first got infected) MSK: on disability d/t back pain- HAs GI:  --GERD, diverticulitis 2014, h/o PUD -- NASH with cirrhosis per GI note 10/2017, s/p Hep A/B shots --Anemia:  Mild, on and off, felt to be d/t   GAVE (gastric antral vascular ectasia) and a inflammatorypolyp (per EGD 10/2018) .  Colonoscopy 2015, 1 polyp, next 10 years per letter.   Psoriasis, psoriatic arthritis: used  HUMIRA  CV: +FH CAD brother MI age 33 Abnormal EKG, saw cardiology 01-2019, echo essentially negative, perfusion stress test 03/01/2019 (-) H/o urolithiasis Hypogonadism  Dx 2012, normal T 11-2014 (on no RX)  PLAN DM: Last visit with endocrinology 07/12/2019, was Rx Rybelsus in addition to metformin, insulin, amaryl HTN: Controlled?  Very mild elevation here, at home he did have a high reading.  Recommend no change and cont  monitoring BPs, see AVS, consider increase the dose of carvedilol.  Check CMP High cholesterol: Lipitor increase it to 20 mg on 04-2019, check a FLP Cardiovascular:Last visit with cardiology 05/29/2019, felt to be stable. CRI: Checking labs.  Last available  nephrology note from 12/2018 reviewed. Chronic headaches: Headaches increasing in the last few days, due to see neurology, refer to Dr. Felecia Shelling.  ER if severe  headaches Anemia: Currently with no sxs, evaluated by GI last year, had a EGD, anemia felt to be due to GAVE (gastric antral vascular ectasia) and a gastric polyp. Will check a CBC and TIBC.  Depending on results may ask GI to see, they  are considering a sooner colonoscopy (last C-scope 2015, next 10 years) RTC 3 months    This visit occurred during the SARS-CoV-2 public health emergency.  Safety protocols were in place, including screening questions prior to the visit, additional usage of staff PPE, and extensive cleaning of exam room while observing appropriate contact time as indicated for disinfecting solutions.

## 2019-10-10 DIAGNOSIS — I10 Essential (primary) hypertension: Secondary | ICD-10-CM | POA: Insufficient documentation

## 2019-10-10 HISTORY — DX: Essential (primary) hypertension: I10

## 2019-10-10 LAB — IRON,TIBC AND FERRITIN PANEL
%SAT: 14 % — ABNORMAL LOW (ref 20–48)
Ferritin: 15 ng/mL — ABNORMAL LOW (ref 24–380)
Iron: 58 ug/dL (ref 50–180)
TIBC: 412 ug/dL (ref 250–425)

## 2019-10-10 NOTE — Assessment & Plan Note (Signed)
DM: Last visit with endocrinology 07/12/2019, was Rx Rybelsus in addition to metformin, insulin, amaryl HTN: Controlled?  Very mild elevation here, at home he did have a high reading.  Recommend no change and cont  monitoring BPs, see AVS, consider increase the dose of carvedilol.  Check CMP High cholesterol: Lipitor increase it to 20 mg on 04-2019, check a FLP Cardiovascular:Last visit with cardiology 05/29/2019, felt to be stable. CRI: Checking labs.  Last available  nephrology note from 12/2018 reviewed. Chronic headaches: Headaches increasing in the last few days, due to see neurology, refer to Dr. Felecia Shelling.  ER if severe headaches Anemia: Currently with no sxs, evaluated by GI last year, had a EGD, anemia felt to be due to GAVE (gastric antral vascular ectasia) and a gastric polyp. Will check a CBC and TIBC.  Depending on results may ask GI to see, they  are considering a sooner colonoscopy (last C-scope 2015, next 10 years) RTC 3 months

## 2019-10-11 ENCOUNTER — Other Ambulatory Visit: Payer: Self-pay

## 2019-10-11 ENCOUNTER — Other Ambulatory Visit (INDEPENDENT_AMBULATORY_CARE_PROVIDER_SITE_OTHER): Payer: 59

## 2019-10-11 DIAGNOSIS — E1165 Type 2 diabetes mellitus with hyperglycemia: Secondary | ICD-10-CM | POA: Diagnosis not present

## 2019-10-11 LAB — BASIC METABOLIC PANEL
BUN: 22 mg/dL (ref 6–23)
CO2: 23 mEq/L (ref 19–32)
Calcium: 9.5 mg/dL (ref 8.4–10.5)
Chloride: 105 mEq/L (ref 96–112)
Creatinine, Ser: 1.32 mg/dL (ref 0.40–1.50)
GFR: 55.03 mL/min — ABNORMAL LOW (ref >60.00)
Glucose, Bld: 155 mg/dL — ABNORMAL HIGH (ref 70–99)
Potassium: 5 mEq/L (ref 3.5–5.1)
Sodium: 135 mEq/L (ref 135–145)

## 2019-10-11 LAB — HEMOGLOBIN A1C: Hgb A1c MFr Bld: 8.3 % — ABNORMAL HIGH (ref 4.6–6.5)

## 2019-10-12 ENCOUNTER — Other Ambulatory Visit: Payer: Self-pay | Admitting: Neurology

## 2019-10-12 ENCOUNTER — Other Ambulatory Visit: Payer: Self-pay | Admitting: Endocrinology

## 2019-10-12 MED ORDER — IRON 325 (65 FE) MG PO TABS: 1.0000 | ORAL_TABLET | Freq: Two times a day (BID) | ORAL | 3 refills | Status: DC

## 2019-10-12 MED FILL — FENOFIBRATE 134 MG CAPSULE: 134 | 30 days supply | Qty: 15 | Fill #0

## 2019-10-12 MED FILL — GABAPENTIN 600 MG TABLET: 600 | 30 days supply | Qty: 60 | Fill #3

## 2019-10-12 MED FILL — GLIMEPIRIDE 4 MG TAB: 4 | 30 days supply | Qty: 30 | Fill #2

## 2019-10-12 MED FILL — OMEPRAZOLE 40 MG CPDR: 40 | 90 days supply | Qty: 180 | Fill #1

## 2019-10-12 MED FILL — levETIRAcetam 750 MG TABS: 750 | 30 days supply | Qty: 60 | Fill #0

## 2019-10-12 MED FILL — METFORMIN HCL 500 MG TABS: 500 | 30 days supply | Qty: 120 | Fill #1

## 2019-10-12 MED FILL — FERROUS SULFATE 325 MG TAB: 325 (65 FE) | 50 days supply | Qty: 100 | Fill #0

## 2019-10-12 MED FILL — HUMALOG MIX 75-25 KWIKPEN: (75-25) 100 | 31 days supply | Qty: 48 | Fill #4

## 2019-10-12 NOTE — Addendum Note (Signed)
Addended byDamita Dunnings D on: 10/12/2019 09:15 AM   Modules accepted: Orders

## 2019-10-12 NOTE — Progress Notes (Signed)
This encounter was created in error - please disregard.

## 2019-10-13 ENCOUNTER — Encounter: Payer: 59 | Admitting: Endocrinology

## 2019-10-13 ENCOUNTER — Other Ambulatory Visit: Payer: Self-pay

## 2019-10-13 ENCOUNTER — Encounter: Payer: Self-pay | Admitting: Endocrinology

## 2019-10-23 ENCOUNTER — Ambulatory Visit: Payer: 59 | Admitting: Internal Medicine

## 2019-10-24 ENCOUNTER — Other Ambulatory Visit: Payer: Self-pay

## 2019-10-25 ENCOUNTER — Encounter: Payer: Self-pay | Admitting: Endocrinology

## 2019-10-25 ENCOUNTER — Ambulatory Visit (INDEPENDENT_AMBULATORY_CARE_PROVIDER_SITE_OTHER): Payer: 59 | Admitting: Endocrinology

## 2019-10-25 VITALS — BP 124/78 | HR 74 | Ht 71.0 in | Wt 267.2 lb

## 2019-10-25 DIAGNOSIS — E1165 Type 2 diabetes mellitus with hyperglycemia: Secondary | ICD-10-CM | POA: Diagnosis not present

## 2019-10-25 DIAGNOSIS — IMO0002 Reserved for concepts with insufficient information to code with codable children: Secondary | ICD-10-CM

## 2019-10-25 DIAGNOSIS — E1142 Type 2 diabetes mellitus with diabetic polyneuropathy: Secondary | ICD-10-CM

## 2019-10-25 MED ORDER — OZEMPIC (0.25 OR 0.5 MG/DOSE) 2 MG/1.5ML ~~LOC~~ SOPN: PEN_INJECTOR | SUBCUTANEOUS | 2 refills | Status: DC

## 2019-10-25 MED FILL — DULoxetine HCL 60 MG CPEP: 60 | 30 days supply | Qty: 60 | Fill #3

## 2019-10-25 MED FILL — OZEMPIC 0.25 OR 0.5 MG/DOSE: 2 | 28 days supply | Qty: 2 | Fill #0

## 2019-10-25 NOTE — Patient Instructions (Addendum)
Check blood sugars on waking up 7 days a week  Also check blood sugars about 2 hours after meals and do this after different meals by rotation  Recommended blood sugar levels on waking up are 90-130 and about 2 hours after meal is 130-160  Please bring your blood sugar monitor to each visit, thank you  Start OZEMPIC injections by dialing 0.25 mg on the pen as shown once weekly on the same day of the week.   You may inject in the sides of the stomach, outer thigh or arm as indicated in the brochure given. If you have any difficulties using the pen see the video at [employer].[email or web address]  You will feel fullness of the stomach with starting the medication and should try to keep the portions at meals small.  You may experience nausea in the first few days which usually gets better over time    After 2 weeks increase the dose to 0.5 mg weekly  If you have any questions or persistent side effects please call the office   You may also talk to a nurse educator with [employer] at [phone] Useful website: [email or web address]

## 2019-10-25 NOTE — Progress Notes (Signed)
Patient ID: James [name], male   DOB: 09/15/1955, 64 y.o.   MRN: [id number]           Reason for Appointment: Follow-up for Type 2 Diabetes   History of Present Illness:          Date of diagnosis of type 2 diabetes mellitus:  ?  2014      Background history:  He is not clear how his diabetes was diagnosed, likely on routine testing. Initially had been treated with metformin and also tried on Amaryl Patient had progressive increase in his blood sugars since 1/17 with stopping metformin and being on the regimen of Amaryl and Januvia, he thinks his blood sugars went up to 601.  He was then started on basal bolus insulin   Lowest A1c was 6.8 previously  Recent history:    Non-insulin hypoglycemic drugs the patient is taking are: Rybelsus 14 mg: Not taking, Amaryl 77m bedtime, metformin 1000 mg twice daily  His A1c is back up to 8.3 compared to 7.6    Current management, blood sugar patterns and problems identified:   He on his own stopped his Humalog mix a couple of weeks ago because he thought his blood sugars are generally lower and was trying to take it only as needed  Blood sugars do appear to be somewhat better in the last 2 weeks mostly  He has been able to continue on the freestyle libre although recently has had the sensor falling off and now using a transparent tape to keep it on  His last visit about 3 months ago  He has been able to tolerate 7 mg of Rybelsus with some improvement in his blood sugars  However he has variable intake for his meals and sometimes in the morning will only have a grapefruit for breakfast  Occasionally on weekends or other days may have a larger breakfast  Evening meals are variable but tends to have higher readings between 8 PM and midnight  He has limited ability to walk because of joints and back still hurting on movement  Side  effects from medications have been: None  Compliance with the medical regimen: Fair  Glucose  monitoring:  done up to 5 times a day         Glucometer:  Freestyle libre Blood Glucose readings    CONTINUOUS GLUCOSE MONITORING RECORD INTERPRETATION    Dates of Recording: Last 2 weeks  Sensor description: Freestyle libre  Results statistics:   CGM use % of time  78  Average and SD  157, GV 25  Time in range    70    %  % Time Above 180  28  % Time above 250 1  % Time Below target 1    PRE-MEAL Fasting Lunch Dinner Bedtime Overall  Glucose range:       Mean/median:  157   137   172  157   POST-MEAL PC Breakfast PC Lunch PC Dinner  Glucose range:     Mean/median:  197   165    Glycemic patterns summary: His blood sugars are the lowest overnight and generally higher midday and early afternoon and late at night also Hypoglycemia has been minimal except rarely  Hyperglycemic episodes are occurring primarily midday occasionally and rarely late evening, occasionally blood sugars are relatively higher late at night the night  Hypoglycemic episodes occurred only once or twice with lowest blood sugar 55 midday and another low episode around 7 PM  Overnight periods: Blood sugars are averaging about 180 at midnight and then usually start coming down by 2 AM but are again rising after about 7 AM  Preprandial periods: Mealtimes are variable and difficult to know which are his mealtimes, however blood sugars are somewhat higher late morning when he is eating breakfast averaging as above Blood sugars are mildly increased overall at dinnertime  Postprandial periods:   After breakfast: Blood sugars are fairly good with about 3 episodes of sharp increases in blood sugars Mostly eating 2 meals a day and evening blood sugars are only significantly going up a couple of times but generally blood sugars are higher by bedtime  Previous readings:   CGM use % of time  87  Average and SD  231 9, GV 37  Time in range     34   %  % Time Above 180  23  % Time above 250  43  % Time Below  target  0    PRE-MEAL Fasting Lunch Dinner Bedtime Overall  Glucose range:       Mean/median:  132   256  282   231   POST-MEAL PC Breakfast PC Lunch PC Dinner  Glucose range:     Mean/median: 167  286  311    Glycemic patterns summary: Blood sugars from the [place] are slightly lower than the lab glucose His blood sugars are showing significant hyperglycemia at all times except early morning No hypoglycemia HIGHEST blood sugars are after 10 PM until 2 AM  Hyperglycemic episodes are occurring during the day with blood sugars rising significantly after 10 AM and continuing to rise later in the day especially after 10 PM  Hypoglycemic episodes have not occurred  Overnight periods: Blood sugars declining after about midnight when they are averaging about 330 and declining until about 9 AM when they are about 130  Preprandial periods: Blood sugars are mildly increased around 130 in the early mornings Blood sugars at noon are averaging 250 and around dinnertime 280  Postprandial periods:   After breakfast: Blood sugars are rising significantly after about 10 AM although he may not be eating a meal in the morning  After lunch:   Blood sugars continue to rise after the morning increase and plateau around 2-4 PM with blood sugars around 280  After dinner: Blood sugars are variably high but in the upper 200 range fairly consistently   Self-care:   Meal times are:  Breakfast variable, lunch: 2-3 PM Dinner: 5-6 PM    Dietician visit, most recent:09/2015               CDE consultation: 12/2018  Weight history:  Wt Readings from Last 3 Encounters:  10/25/19 267 lb 3.2 oz (121.2 kg)  10/09/19 269 lb 6 oz (122.2 kg)  05/29/19 258 lb 12.8 oz (117.4 kg)    Glycemic control:   Lab Results  Component Value Date   HGBA1C 8.3 (H) 10/11/2019   HGBA1C 7.6 (H) 04/28/2019   HGBA1C 9.2 (H) 03/08/2019   Lab Results  Component Value Date   MICROALBUR 3.4 (H) 12/28/2018   LDLCALC 106 (H)  04/28/2019   CREATININE 1.32 10/11/2019    No visits with results within 1 Week(s) from this visit.  Latest known visit with results is:  Lab on 10/11/2019  Component Date Value Ref Range Status  . Hgb A1c MFr Bld 10/11/2019 8.3* 4.6 - 6.5 % Final   Glycemic Control Guidelines for People with Diabetes:Non  Diabetic:  <6%Goal of Therapy: <7%Additional Action Suggested:  >8%   . Sodium 10/11/2019 135  135 - 145 mEq/L Final  . Potassium 10/11/2019 5.0  3.5 - 5.1 mEq/L Final  . Chloride 10/11/2019 105  96 - 112 mEq/L Final  . CO2 10/11/2019 23  19 - 32 mEq/L Final  . Glucose, Bld 10/11/2019 155* 70 - 99 mg/dL Final  . BUN 10/11/2019 22  6 - 23 mg/dL Final  . Creatinine, Ser 10/11/2019 1.32  0.40 - 1.50 mg/dL Final  . GFR 10/11/2019 55.03* >60.00 mL/min Final  . Calcium 10/11/2019 9.5  8.4 - 10.5 mg/dL Final       Allergies as of 10/25/2019      Reactions   Bee Venom Anaphylaxis   Hydrocodone-homatropine Other (See Comments)   Hallucinations, confusion, delirium Depressed feeling   Toradol [ketorolac Tromethamine] Other (See Comments)   Hallucinations, confusion, delirium   Morphine And Related Other (See Comments)   Hallucinations, back in the 80s. States has taken vicodin before w/o problems    Sulfa Drugs Cross Reactors Rash      Medication List       Accurate as of October 25, 2019  4:07 PM. If you have any questions, ask your nurse or doctor.        STOP taking these medications   Rybelsus 7 MG Tabs Generic drug: Semaglutide Replaced by: Ozempic (0.25 or 0.5 MG/DOSE) 2 MG/1.5ML Sopn     TAKE these medications   atorvastatin 20 MG tablet Commonly known as: LIPITOR Take 1 tablet (20 mg total) by mouth at bedtime.   carvedilol 12.5 MG tablet Commonly known as: COREG Take 1 tablet (12.5 mg total) by mouth 2 (two) times daily with a meal.   DULoxetine 60 MG capsule Commonly known as: CYMBALTA Take 2 capsules (120 mg total) by mouth daily.   EPINEPHrine 0.3 mg/0.3  mL Soaj injection Commonly known as: EpiPen 2-Pak Inject 0.3 mLs (0.3 mg total) into the muscle once as needed for up to 1 dose.   fenofibrate micronized 134 MG capsule Commonly known as: LOFIBRA TAKE 1 CAPSULE BY MOUTH THREE TIMES WEEKLY.   FreeStyle Freedom Lite w/Device Kit Use to check blood sugars 3 times daily. Dx code: E11.9   freestyle lancets Use to check blood sugar 3 times daily. Dx code E11.9   FreeStyle Libre 14 Day Sensor Misc USE AS DIRECTED TO CHECK BLOOD SUGAR   gabapentin 600 MG tablet Commonly known as: NEURONTIN TAKE 1 TABLET BY MOUTH TWICE DAILY   Galcanezumab-gnlm 120 MG/ML Soaj Commonly known as: Emgality Inject 1 pen into the skin every 28 (twenty-eight) days. What changed: how much to take   glimepiride 4 MG tablet Commonly known as: AMARYL Take 1 tablet (4 mg total) by mouth daily before breakfast.   glucose blood test strip Use Freestyle Freedom Lite test strips as instructed to check blood sugar 4 times daily.   Humira Pen 40 MG/0.8ML Pnkt Generic drug: Adalimumab Inject 40 mg into the skin every 14 (fourteen) days.   hydrocortisone 25 MG suppository Commonly known as: ANUSOL-HC Place 1 suppository (25 mg total) rectally 2 (two) times daily as needed for hemorrhoids.   Insulin Lispro Prot & Lispro (75-25) 100 UNIT/ML Kwikpen Commonly known as: HumaLOG Mix 75/25 KwikPen 40 units before breakfast, 60 before lunch and 60 units before dinner   Iron 325 (65 Fe) MG Tabs Take 1 tablet (325 mg total) by mouth 2 (two) times daily before a meal.  levETIRAcetam 750 MG tablet Commonly known as: KEPPRA TAKE 1 TABLET (750 MG TOTAL) BY MOUTH 2 (TWO) TIMES DAILY.   metFORMIN 500 MG tablet Commonly known as: GLUCOPHAGE TAKE 2 TABLETS BY MOUTH TWICE DAILY.   omeprazole 40 MG capsule Commonly known as: PRILOSEC Take 1 capsule (40 mg total) by mouth 2 (two) times daily before a meal.   Ozempic (0.25 or 0.5 MG/DOSE) 2 MG/1.5ML Sopn Generic drug:  Semaglutide(0.25 or 0.5MG/DOS) Inject 0.25 mg weekly for 2 doses then 0.5 weekly Replaces: Rybelsus 7 MG Tabs       Allergies:  Allergies  Allergen Reactions  . Bee Venom Anaphylaxis  . Hydrocodone-Homatropine Other (See Comments)    Hallucinations, confusion, delirium Depressed feeling  . Toradol [Ketorolac Tromethamine] Other (See Comments)    Hallucinations, confusion, delirium  . Morphine And Related Other (See Comments)    Hallucinations, back in the 80s. States has taken vicodin before w/o problems   . Sulfa Drugs Cross Reactors Rash    Past Medical History:  Diagnosis Date  . Acid reflux   . Arthritis   . Chronic headaches    on cymbalta  . Cirrhosis ([place])   . Colon polyps   . Complication of anesthesia    problems waking up from anesthesia  . Depression    on cymbalta  . Diabetes mellitus with neuropathy ([place])   . Diverticulitis 03/2013  . Eczema   . Elevated LFTs   . Fatty liver   . Hepatitis 10/2017   NASH cirrhosis  . History of kidney stones   . Hypertension   . Insomnia 04/26/2013  . Kidney stone   . Morbid obesity ([place])   . Neuromuscular disorder (HCC)    neuropathy  . Neuropathy   . Post-traumatic hydrocephalus (HCC)    s/p shunts x 2 (first got infected )  . Psoriasis    sees Dr [name]  . Psoriatic arthritis ([place])   . 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 Sleep apnea    no CPAP     . Stomach ulcer   . Testosterone deficiency 04/28/2011    Past Surgical History:  Procedure Laterality Date  . BACK SURGERY  1980  . BRAIN SURGERY     VP shunts placed in 2007  . CHOLECYSTECTOMY N/A 08/25/2017   Procedure: LAPAROSCOPIC CHOLECYSTECTOMY WITH INTRAOPERATIVE CHOLANGIOGRAM;  Surgeon: [name], MD;  Location: [facility];  Service: General;  Laterality: N/A;  . COLONOSCOPY    . JOINT REPLACEMENT     total hip  . LUMBAR FUSION  03/16/2019  . SHOULDER SURGERY Left 2010  . TOE SURGERY Left 2018  .  TONSILLECTOMY     as a child  . TOTAL HIP ARTHROPLASTY Left 2011  . VENTRICULOPERITONEAL SHUNT  2007   x2    Family History  Problem Relation Age of Onset  . Other Mother   . Lung cancer Father        alive, former smoker   . Heart disease Brother        MI age 72  . Other Brother        Murdered  . Down syndrome Son   . Diabetes Neg Hx   . Prostate cancer Neg Hx   . Colon cancer Neg Hx   . Stomach cancer Neg Hx   . Pancreatic cancer Neg Hx   . Liver disease Neg Hx     Social History:  reports that he has  never smoked. He has never used smokeless tobacco. He reports that he does not drink alcohol or use drugs.    Review of Systems     HYPERTENSION: On carvedilol for some time Followed by PCP   BP Readings from Last 3 Encounters:  10/25/19 124/78  10/09/19 (!) 144/81  05/29/19 134/78   RENAL dysfunction: Has had variable results but recently better  Lab Results  Component Value Date   CREATININE 1.32 10/11/2019   CREATININE 1.50 10/09/2019   CREATININE 1.46 07/10/2019    Lipid history: He is on Lipitor  20 mg, last LDL over 100 and needs follow-up  Is on fenofibrate for triglyceride treatment, has increased triglycerides with results as below      Lab Results  Component Value Date   CHOL 172 10/09/2019   HDL 30.90 (L) 10/09/2019   LDLCALC 106 (H) 04/28/2019   LDLDIRECT 83.0 10/09/2019   TRIG 283.0 (H) 10/09/2019   CHOLHDL 6 10/09/2019            Most recent foot exam: 08/2017  He has symptomatic painful neuropathy and is on gabapentin 600 mg twice a day and  Cymbalta 60 mg No recent increase in symptoms  Has been followed by neurologist    Review of Systems    Physical Examination:  BP 124/78 (BP Location: Left Arm, Patient Position: Sitting, Cuff Size: Normal)   Pulse 74   Ht 5' 11"  (1.803 m)   Wt 267 lb 3.2 oz (121.2 kg)   SpO2 98%   BMI 37.27 kg/m       ASSESSMENT:  Diabetes type 2 with obesity  See history of present  illness for discussion of current diabetes management, blood sugar patterns and problems identified  Currently on a regimen of Rybelsus, Metformin and 4 mg Amaryl  A1c was previously 7.7 and now 8.3  He appears to be quite inconsistent with his treatment regimen Although he is taking Rybelsus again and is not having nausea with the 7 mg capsules he may not always take it consistently because of his irregular breakfast times He thinks his blood sugars were low when he was taking insulin and he has taken none recently Also cannot confirm that he is taking Amaryl in the morning  He appears to have periodic postprandial hyperglycemia depending on his carbohydrate intake and compliance with diet overall He has difficulty losing weight and has not been able to exercise much  However he is able to monitor his blood sugars more closely with the freestyle libre currently  NEUROPATHY: Symptomatically doing well and hopefully will continue to improve with better blood sugar control  Lipids: Recently followed by PCP, still has high triglycerides  PLAN:   Since he likely will do well with the semaglutide treatment plan and will get better efficacy with Ozempic he will switch from Rybelsus This will also give him more consistent medication with once a week injection Hopefully also since this is not oral he may be able to eventually go up to 1 mg dosage with a potential for significant weight loss Explained to the patient what GLP-1 drugs are, the sites of actions and the body, reduction of hunger sensation and improved insulin secretion.  Discussed the benefit of weight loss with these medications. Explained possible side effects of OZEMPIC, most commonly nausea that usually improves over time.  Also explained safety information associated with the medication Demonstrated the medication injection device and injection technique to the patient.  To start the injections with  the 0.25 mg dosage weekly for  the first 2 injections and then go up to 0.5 mg weekly if no continued nausea Since he has been on Rybelsus he does not need to wait the 4 weeks for increasing the dose to 0.5 mg  Patient brochure on Ozempic with enclosed co-pay card given  He needs to continue improving his diet with avoiding high carbohydrate and high fat meals especially in the evenings He will try to be as active as possible including water exercises when he opens his pool at home However discussed that if he is going to eat a large meal such as this past Sunday morning he will likely need 20 to 30 units of the insulin before the meal Discussed A1c and blood sugar targets Continue Amaryl and Metformin unchanged but may consider reducing Amaryl if he has any tendency to low sugars during the day  Follow-up with fructosamine in 2 months   Patient Instructions  Check blood sugars on waking up 7 days a week  Also check blood sugars about 2 hours after meals and do this after different meals by rotation  Recommended blood sugar levels on waking up are 90-130 and about 2 hours after meal is 130-160  Please bring your blood sugar monitor to each visit, thank you  Start OZEMPIC injections by dialing 0.25 mg on the pen as shown once weekly on the same day of the week.   You may inject in the sides of the stomach, outer thigh or arm as indicated in the brochure given. If you have any difficulties using the pen see the video at [employer].co.za  You will feel fullness of the stomach with starting the medication and should try to keep the portions at meals small.  You may experience nausea in the first few days which usually gets better over time    After 2 weeks increase the dose to 0.5 mg weekly  If you have any questions or persistent side effects please call the office   You may also talk to a nurse educator with [employer] at [phone] Useful website: [email or web address]            Elayne Snare  10/25/2019, 4:07 PM

## 2019-11-09 ENCOUNTER — Ambulatory Visit (INDEPENDENT_AMBULATORY_CARE_PROVIDER_SITE_OTHER): Payer: 59 | Admitting: Internal Medicine

## 2019-11-09 ENCOUNTER — Encounter: Payer: Self-pay | Admitting: Internal Medicine

## 2019-11-09 VITALS — BP 130/74 | HR 80 | Temp 97.4°F | Ht 71.0 in | Wt 270.4 lb

## 2019-11-09 DIAGNOSIS — K7460 Unspecified cirrhosis of liver: Secondary | ICD-10-CM

## 2019-11-09 DIAGNOSIS — K317 Polyp of stomach and duodenum: Secondary | ICD-10-CM | POA: Diagnosis not present

## 2019-11-09 DIAGNOSIS — D500 Iron deficiency anemia secondary to blood loss (chronic): Secondary | ICD-10-CM

## 2019-11-09 MED ORDER — SUTAB 1479-225-188 MG PO TABS: 1.0000 | ORAL_TABLET | Freq: Once | ORAL | 0 refills | Status: AC

## 2019-11-09 NOTE — Progress Notes (Signed)
HISTORY OF PRESENT ILLNESS:  James [name] is a 64 y.o. male with compensated [place] cirrhosis, morbid obesity, GERD, history of diverticulitis, prior cholecystectomy, and multiple additional medical problems including diabetes mellitus.  He is sent today regarding iron deficiency anemia.  He is accompanied by his wife.  He was last evaluated virtually April 2020.  Blood work from October 09, 2019 revealed hemoglobin of 11.1 with MCV 82.5.  Iron studies at that time revealed iron saturation of 14% and ferritin level of 15.  Comprehensive metabolic panel revealed normal liver tests with bilirubin 0.7.  Creatinine 1.3.  Sodium 135.  His last abdominal ultrasound was November 2020.  No mass lesion of the liver.  No ascites.  Patient does report having had lots of rectal bleeding several months ago.  Confirmed by his wife.  Bright red blood.  No associated abdominal or rectal pain.  He also complains of generalized abdominal discomfort at times.  Increased gas.  His last upper endoscopy was performed May 2020.  He was found to have mild portal gastropathy and inflammatory gastric polyp.  No varices.  His last colonoscopy was performed February 2015.  1 polyp.  Pandiverticulosis.  He was recently placed on iron supplement.  This constipate him.  Not taking regularly.  Has had some problems with medical compliance.  He has been fully vaccinated to Covid  REVIEW OF SYSTEMS:  All non-GI ROS negative unless otherwise stated in the HPI except for fatigue Past Medical History:  Diagnosis Date  . Acid reflux   . Arthritis   . Chronic headaches    on cymbalta  . Cirrhosis ([place])   . Colon polyps   . Complication of anesthesia    problems waking up from anesthesia  . Depression    on cymbalta  . Diabetes mellitus with neuropathy ([place])   . Diverticulitis 03/2013  . Eczema   . Elevated LFTs   . Fatty liver   . Hepatitis 10/2017   NASH cirrhosis  . History of kidney stones   . Hypertension   . Insomnia  04/26/2013  . Kidney stone   . Morbid obesity ([place])   . Neuromuscular disorder (HCC)    neuropathy  . Neuropathy   . Post-traumatic hydrocephalus (HCC)    s/p shunts x 2 (first got infected )  . Psoriasis    sees Dr [name]  . Psoriatic arthritis ([place])   . 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 Sleep apnea    no CPAP     . Stomach ulcer   . Testosterone deficiency 04/28/2011    Past Surgical History:  Procedure Laterality Date  . BACK SURGERY  1980  . BRAIN SURGERY     VP shunts placed in 2007  . CHOLECYSTECTOMY N/A 08/25/2017   Procedure: LAPAROSCOPIC CHOLECYSTECTOMY WITH INTRAOPERATIVE CHOLANGIOGRAM;  Surgeon: [name], MD;  Location: [facility];  Service: General;  Laterality: N/A;  . COLONOSCOPY    . JOINT REPLACEMENT     total hip  . LUMBAR FUSION  03/16/2019  . SHOULDER SURGERY Left 2010  . TOE SURGERY Left 2018  . TONSILLECTOMY     as a child  . TOTAL HIP ARTHROPLASTY Left 2011  . VENTRICULOPERITONEAL SHUNT  2007   x2    Social History James [name]  reports that he has never smoked. He has never used smokeless tobacco. He reports that he does not drink alcohol or use drugs.  family history includes  Down syndrome in his son; Heart disease in his brother; Lung cancer in his father; Other in his brother and mother.  Allergies  Allergen Reactions  . Bee Venom Anaphylaxis  . Hydrocodone-Homatropine Other (See Comments)    Hallucinations, confusion, delirium Depressed feeling  . Toradol [Ketorolac Tromethamine] Other (See Comments)    Hallucinations, confusion, delirium  . Morphine And Related Other (See Comments)    Hallucinations, back in the 80s. States has taken vicodin before w/o problems   . Sulfa Drugs Cross Reactors Rash       PHYSICAL EXAMINATION: Vital signs: BP 130/74   Pulse 80   Temp (!) 97.4 F (36.3 C)   Ht 5' 11"  (1.803 m)   Wt 270 lb 6.4 oz (122.7 kg)   BMI 37.71 kg/m   Constitutional:  Overweight and unhealthy appearing, no acute distress Psychiatric: alert and oriented x3, cooperative Eyes: extraocular movements intact, anicteric, conjunctiva pink Mouth: oral pharynx moist, no lesions Neck: supple no lymphadenopathy Cardiovascular: heart regular rate and rhythm, no murmur Lungs: clear to auscultation bilaterally Abdomen: soft, obese, nontender, nondistended, no obvious ascites, no peritoneal signs, normal bowel sounds, no organomegaly Rectal: Deferred until colonoscopy Extremities: no clubbing or cyanosis.  Chronic stasis changes with mild lower extremity edema bilaterally Skin: no lesions on visible extremities Neuro: No focal deficits. No asterixis.    ASSESSMENT:  1.  Iron deficiency anemia.  Suspect secondary to portal hypertensive gastropathy and/or inflammatory gastric polyp previously noted. 2.  Rectal bleeding.  Suspect hemorrhoids 3.  NASH cirrhosis of the liver.  Compensated.  Meld score remains 10 4.  GERD.  Asymptomatic on PPI 5.  Intermittent vague abdominal discomfort 6.  Status post cholecystectomy 7.  Variable medical compliance 8.  History of diverticulitis 9.  Status post hepatitis vaccination series and Covid vaccination  PLAN:  1.  Colonoscopy to evaluate rectal bleeding and iron deficiency anemia.  The patient is HIGH RISK given his comorbidities and the need to address his diabetic medications.The nature of the procedure, as well as the risks, benefits, and alternatives were carefully and thoroughly reviewed with the patient. Ample time for discussion and questions allowed. The patient understood, was satisfied, and agreed to proceed. 2.  Upper endoscopy to evaluate iron deficiency anemia and reassess inflammatory polyp.  The patient is high risk as above.The nature of the procedure, as well as the risks, benefits, and alternatives were carefully and thoroughly reviewed with the patient. Ample time for discussion and questions allowed. The  patient understood, was satisfied, and agreed to proceed. 3.  Continue omeprazole 4.  Resume iron therapy 5.  For iron related constipation take stool softener 6.  Schedule abdominal ultrasound to screen for [place] 7.  Exercise and weight loss 8.  Ongoing general medical care with Dr. [name] 9.  Hold diabetic medications the morning of the procedure to avoid unwanted hypoglycemia A total time of 40 minutes was spent preparing to see the patient, reviewing test, x-rays, prior endoscopy reports, and laboratories.  Obtaining comprehensive history and performing comprehensive physical exam.  Counseling the patient and his wife regarding his multiple above listed issues.  Ordering advanced procedures.  Documenting all relevant clinical information in the health record

## 2019-11-09 NOTE — Patient Instructions (Addendum)
If you are age 62 or older, your body mass index should be between 23-30. Your Body mass index is 37.71 kg/m. If this is out of the aforementioned range listed, please consider follow up with your Primary Care Provider.  If you are age 6 or younger, your body mass index should be between 19-25. Your Body mass index is 37.71 kg/m. If this is out of the aformentioned range listed, please consider follow up with your Primary Care Provider.   You have been scheduled for an abdominal ultrasound at [facility] (1st floor of hospital) on 11/14/2019 at 10:00am. Please arrive 15 minutes prior to your appointment for registration. Make certain not to have anything to eat or drink after midnight prior to your appointment. Should you need to reschedule your appointment, please contact radiology at [phone]. This test typically takes about 30 minutes to perform.  You have been scheduled for an endoscopy and colonoscopy. Please follow the written instructions given to you at your visit today. Please pick up your prep supplies at the pharmacy within the next 1-3 days. If you use inhalers (even only as needed), please bring them with you on the day of your procedure.

## 2019-11-14 ENCOUNTER — Other Ambulatory Visit: Payer: Self-pay

## 2019-11-14 ENCOUNTER — Ambulatory Visit (HOSPITAL_COMMUNITY)
Admission: RE | Admit: 2019-11-14 | Discharge: 2019-11-14 | Disposition: A | Discharge status: Home / Self Care | Payer: 59 | Source: Ambulatory Visit | Attending: Internal Medicine | Admitting: Internal Medicine

## 2019-11-14 DIAGNOSIS — K7460 Unspecified cirrhosis of liver: Secondary | ICD-10-CM | POA: Diagnosis not present

## 2019-11-14 DIAGNOSIS — D500 Iron deficiency anemia secondary to blood loss (chronic): Secondary | ICD-10-CM | POA: Diagnosis not present

## 2019-11-14 DIAGNOSIS — K317 Polyp of stomach and duodenum: Secondary | ICD-10-CM | POA: Insufficient documentation

## 2019-11-16 ENCOUNTER — Other Ambulatory Visit: Payer: Self-pay | Admitting: Endocrinology

## 2019-11-16 MED FILL — METFORMIN HCL 500 MG TABS: 500 | 30 days supply | Qty: 120 | Fill #2

## 2019-11-16 MED FILL — GLIMEPIRIDE 4 MG TABLET: 4 | 30 days supply | Qty: 30 | Fill #3

## 2019-11-16 MED FILL — CARVEDILOL 12.5 MG TABLET: 12.5 | 90 days supply | Qty: 180 | Fill #1

## 2019-11-16 MED FILL — levETIRAcetam 750 MG TABS: 750 | 30 days supply | Qty: 60 | Fill #1

## 2019-11-16 MED FILL — FENOFIBRATE 134 MG CAPSULE: 134 | 35 days supply | Qty: 15 | Fill #0

## 2019-11-16 MED FILL — GABAPENTIN 600 MG TABLET: 600 | 30 days supply | Qty: 60 | Fill #4

## 2019-11-20 MED FILL — DULoxetine HCL 60 MG CPEP: 60 | 30 days supply | Qty: 60 | Fill #4

## 2019-11-24 ENCOUNTER — Ambulatory Visit (INDEPENDENT_AMBULATORY_CARE_PROVIDER_SITE_OTHER): Payer: 59 | Admitting: Neurology

## 2019-11-24 ENCOUNTER — Encounter: Payer: Self-pay | Admitting: Neurology

## 2019-11-24 ENCOUNTER — Other Ambulatory Visit: Payer: Self-pay

## 2019-11-24 VITALS — BP 129/83 | HR 79 | Ht 71.0 in | Wt 266.5 lb

## 2019-11-24 DIAGNOSIS — Z6837 Body mass index (BMI) 37.0-37.9, adult: Secondary | ICD-10-CM | POA: Diagnosis not present

## 2019-11-24 DIAGNOSIS — G4733 Obstructive sleep apnea (adult) (pediatric): Secondary | ICD-10-CM | POA: Diagnosis not present

## 2019-11-24 DIAGNOSIS — G43709 Chronic migraine without aura, not intractable, without status migrainosus: Secondary | ICD-10-CM

## 2019-11-24 DIAGNOSIS — G911 Obstructive hydrocephalus: Secondary | ICD-10-CM | POA: Diagnosis not present

## 2019-11-24 DIAGNOSIS — IMO0002 Reserved for concepts with insufficient information to code with codable children: Secondary | ICD-10-CM
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
## 2019-11-29 ENCOUNTER — Telehealth: Payer: Self-pay | Admitting: Neurology

## 2019-11-29 NOTE — Telephone Encounter (Signed)
I called patient to schedule his first Botox appointment. Patient's wife answered and stated that patient had teeth pulled this morning so he is resting. I offered next available on Dr. Garth Bigness schedule, 7/19, and she said that would be good for patient. I stated if anything sooner opened up, I would call and reschedule them. She verbalized understanding.

## 2019-12-04 NOTE — Telephone Encounter (Signed)
No PA required for patient. Patient has [facility] UMR and will be B/B.

## 2019-12-11 MED FILL — CLINDAMYCIN HCL 150 MG CAPS: 150 | 7 days supply | Qty: 21 | Fill #0

## 2019-12-11 MED FILL — FREESTYLE LIBRE 14 DAY SENS: 28 days supply | Qty: 2 | Fill #3

## 2019-12-11 MED FILL — OZEMPIC 0.25 OR 0.5 MG/DOSE: 2 | 28 days supply | Qty: 2 | Fill #1

## 2019-12-21 ENCOUNTER — Encounter: Payer: Self-pay | Admitting: Internal Medicine

## 2019-12-21 ENCOUNTER — Other Ambulatory Visit: Payer: Self-pay

## 2019-12-21 ENCOUNTER — Other Ambulatory Visit (INDEPENDENT_AMBULATORY_CARE_PROVIDER_SITE_OTHER): Payer: 59

## 2019-12-21 DIAGNOSIS — E1165 Type 2 diabetes mellitus with hyperglycemia: Secondary | ICD-10-CM | POA: Diagnosis not present

## 2019-12-21 DIAGNOSIS — E1142 Type 2 diabetes mellitus with diabetic polyneuropathy: Secondary | ICD-10-CM

## 2019-12-21 DIAGNOSIS — IMO0002 Reserved for concepts with insufficient information to code with codable children: Secondary | ICD-10-CM

## 2019-12-21 LAB — BASIC METABOLIC PANEL
BUN: 19 mg/dL (ref 6–23)
CO2: 24 mEq/L (ref 19–32)
Calcium: 9.6 mg/dL (ref 8.4–10.5)
Chloride: 107 mEq/L (ref 96–112)
Creatinine, Ser: 1.5 mg/dL (ref 0.40–1.50)
GFR: 47.45 mL/min — ABNORMAL LOW (ref >60.00)
Glucose, Bld: 189 mg/dL — ABNORMAL HIGH (ref 70–99)
Potassium: 4.3 mEq/L (ref 3.5–5.1)
Sodium: 132 mEq/L — ABNORMAL LOW (ref 135–145)

## 2019-12-22 ENCOUNTER — Other Ambulatory Visit: Payer: Self-pay | Admitting: Neurology

## 2019-12-22 ENCOUNTER — Other Ambulatory Visit: Payer: Self-pay | Admitting: Internal Medicine

## 2019-12-22 LAB — FRUCTOSAMINE: Fructosamine: 315 umol/L — ABNORMAL HIGH (ref 0–285)

## 2019-12-22 MED FILL — ATORVASTATIN 20 MG TABLET: 20 | 90 days supply | Qty: 90 | Fill #0

## 2019-12-22 MED FILL — METFORMIN HCL 500 MG TABS: 500 | 30 days supply | Qty: 120 | Fill #3

## 2019-12-22 MED FILL — GABAPENTIN 600 MG TABLET: 600 | 30 days supply | Qty: 60 | Fill #5

## 2019-12-22 MED FILL — levETIRAcetam 750 MG TABS: 750 | 30 days supply | Qty: 60 | Fill #0

## 2019-12-27 ENCOUNTER — Ambulatory Visit (INDEPENDENT_AMBULATORY_CARE_PROVIDER_SITE_OTHER): Payer: 59 | Admitting: Endocrinology

## 2019-12-27 ENCOUNTER — Other Ambulatory Visit: Payer: Self-pay

## 2019-12-27 ENCOUNTER — Encounter: Payer: Self-pay | Admitting: Endocrinology

## 2019-12-27 VITALS — BP 118/64 | HR 75 | Ht 71.0 in | Wt 260.4 lb

## 2019-12-27 DIAGNOSIS — E1165 Type 2 diabetes mellitus with hyperglycemia: Secondary | ICD-10-CM | POA: Diagnosis not present

## 2019-12-27 DIAGNOSIS — N289 Disorder of kidney and ureter, unspecified: Secondary | ICD-10-CM | POA: Diagnosis not present

## 2019-12-27 DIAGNOSIS — E1142 Type 2 diabetes mellitus with diabetic polyneuropathy: Secondary | ICD-10-CM | POA: Diagnosis not present

## 2019-12-27 DIAGNOSIS — IMO0002 Reserved for concepts with insufficient information to code with codable children: Secondary | ICD-10-CM

## 2019-12-27 MED ORDER — INSULIN LISPRO (1 UNIT DIAL) 100 UNIT/ML (KWIKPEN): PEN_INJECTOR | SUBCUTANEOUS | 1 refills | Status: DC

## 2019-12-27 MED FILL — HUMALOG 100 UNITS/ML KWIKPE: 100 | 63 days supply | Qty: 15 | Fill #0

## 2019-12-27 MED FILL — HUMIRA PEN 40 MG/0.8ML PNKT: 40 | 28 days supply | Qty: 2 | Fill #4

## 2019-12-27 NOTE — Progress Notes (Signed)
Patient ID: James [name], male   DOB: [birth year], 64 y.o.   MRN: [id number]           Reason for Appointment: Follow-up for Type 2 Diabetes   History of Present Illness:          Date of diagnosis of type 2 diabetes mellitus:  ?  2014      Background history:  He is not clear how his diabetes was diagnosed, likely on routine testing. Initially had been treated with metformin and also tried on Amaryl Patient had progressive increase in his blood sugars since 1/17 with stopping metformin and being on the regimen of Amaryl and Januvia, he thinks his blood sugars went up to 601.  He was then started on basal bolus insulin   Lowest A1c was 6.8 previously  Recent history:    Non-insulin hypoglycemic drugs the patient is taking are:  Amaryl 72m bedtime, metformin 1000 mg twice daily, Ozempic 0.5 mg weekly  His A1c is last up to 8.3 compared to 7.6 Fructosamine is high at 313  Current management, blood sugar patterns and problems identified:   He was switched to Ozempic instead of Rybelsus for better compliance and potentially better control  However his average blood sugar is about the same as on the last visit  He has mostly high postprandial readings but this fluctuates based on his diet  Recently because of dental problems he is eating more soft foods  Sometimes will have more carbohydrate such as bread and pancakes  Still eating out periodically  At times will have regular soft drinks and also sweets like [employer] [place]  Although he is doing a little bit of outside activities and yard work he does not do much exercise because of his back pain  However his weight has come down  Overnight blood sugars are generally fairly good although somewhat variable especially late at night because of snacks  Side  effects from medications have been: None  Compliance with the medical regimen: Fair  Glucose monitoring:  done up to 5 times a day         Glucometer:  Freestyle  libre  CGM use % of time  86  2-week average/SD  161+/-31  Time in range        66% was 70  % Time Above 180  29  % Time above 250 4  % Time Below 70 1     PRE-MEAL Fasting Lunch Dinner Bedtime Overall  Glucose range:       Averages:  132   160  184  161   POST-MEAL PC Breakfast PC Lunch PC Dinner  Glucose range:     Averages:  160  201  180     Previous data:   CGM use % of time  78  Average and SD  157, GV 25  Time in range    70    %  % Time Above 180  28  % Time above 250 1  % Time Below target 1    PRE-MEAL Fasting Lunch Dinner Bedtime Overall  Glucose range:       Mean/median:  157   137   172  157   POST-MEAL PC Breakfast PC Lunch PC Dinner  Glucose range:     Mean/median:  197   165    Self-care:   Meal times are:  Breakfast variable, lunch: 2-3 PM Dinner: 5-6 PM    Dietician visit, most recent:09/2015  CDE consultation: 12/2018  Weight history:  Wt Readings from Last 3 Encounters:  12/27/19 260 lb 6.4 oz (118.1 kg)  11/24/19 266 lb 8 oz (120.9 kg)  11/09/19 270 lb 6.4 oz (122.7 kg)    Glycemic control:   Lab Results  Component Value Date   HGBA1C 8.3 (H) 10/11/2019   HGBA1C 7.6 (H) 04/28/2019   HGBA1C 9.2 (H) 03/08/2019   Lab Results  Component Value Date   MICROALBUR 3.4 (H) 12/28/2018   LDLCALC 106 (H) 04/28/2019   CREATININE 1.50 12/21/2019    Lab on 12/21/2019  Component Date Value Ref Range Status  . Fructosamine 12/21/2019 315* 0 - 285 umol/L Final   Comment: Published reference interval for apparently healthy subjects between age 62 and 33 is 57 - 285 umol/L and in a poorly controlled diabetic population is 228 - 563 umol/L with a mean of 396 umol/L.   Marland Kitchen Sodium 12/21/2019 132* 135 - 145 mEq/L Final  . Potassium 12/21/2019 4.3  3.5 - 5.1 mEq/L Final  . Chloride 12/21/2019 107  96 - 112 mEq/L Final  . CO2 12/21/2019 24  19 - 32 mEq/L Final  . Glucose, Bld 12/21/2019 189* 70 - 99 mg/dL Final  . BUN 12/21/2019  19  6 - 23 mg/dL Final  . Creatinine, Ser 12/21/2019 1.50  0.40 - 1.50 mg/dL Final  . GFR 12/21/2019 47.45* >60.00 mL/min Final  . Calcium 12/21/2019 9.6  8.4 - 10.5 mg/dL Final       Allergies as of 12/27/2019      Reactions   Bee Venom Anaphylaxis   Hydrocodone-homatropine Other (See Comments)   Hallucinations, confusion, delirium Depressed feeling   Toradol [ketorolac Tromethamine] Other (See Comments)   Hallucinations, confusion, delirium   Morphine And Related Other (See Comments)   Hallucinations, back in the 80s. States has taken vicodin before w/o problems    Sulfa Drugs Cross Reactors Rash      Medication List       Accurate as of December 27, 2019 12:58 PM. If you have any questions, ask your nurse or doctor.        STOP taking these medications   Insulin Lispro Prot & Lispro (75-25) 100 UNIT/ML Kwikpen Commonly known as: HumaLOG Mix 75/25 KwikPen Stopped by: Elayne Snare, MD     TAKE these medications   atorvastatin 20 MG tablet Commonly known as: LIPITOR Take 1 tablet (20 mg total) by mouth at bedtime.   carvedilol 12.5 MG tablet Commonly known as: COREG Take 1 tablet (12.5 mg total) by mouth 2 (two) times daily with a meal.   DULoxetine 60 MG capsule Commonly known as: CYMBALTA Take 2 capsules (120 mg total) by mouth daily.   EPINEPHrine 0.3 mg/0.3 mL Soaj injection Commonly known as: EpiPen 2-Pak Inject 0.3 mLs (0.3 mg total) into the muscle once as needed for up to 1 dose.   fenofibrate micronized 134 MG capsule Commonly known as: LOFIBRA TAKE 1 CAPSULE BY MOUTH THREE TIMES WEEKLY.   FreeStyle Freedom Lite w/Device Kit Use to check blood sugars 3 times daily. Dx code: E11.9   freestyle lancets Use to check blood sugar 3 times daily. Dx code E11.9   FreeStyle Libre 14 Day Sensor Misc USE AS DIRECTED TO CHECK BLOOD SUGAR   gabapentin 600 MG tablet Commonly known as: NEURONTIN TAKE 1 TABLET BY MOUTH TWICE DAILY   Galcanezumab-gnlm 120 MG/ML  Soaj Commonly known as: Emgality Inject 1 pen into the skin every 28 (twenty-eight) days.  glimepiride 4 MG tablet Commonly known as: AMARYL Take 1 tablet (4 mg total) by mouth daily before breakfast.   glucose blood test strip Use Freestyle Freedom Lite test strips as instructed to check blood sugar 4 times daily.   Humira Pen 40 MG/0.8ML Pnkt Generic drug: Adalimumab Inject 40 mg into the skin every 14 (fourteen) days.   insulin lispro 100 UNIT/ML KwikPen Commonly known as: HumaLOG KwikPen 4-8 units before each meal Started by: Elayne Snare, MD   levETIRAcetam 750 MG tablet Commonly known as: KEPPRA Take 1 tablet (750 mg total) by mouth 2 (two) times daily.   metFORMIN 500 MG tablet Commonly known as: GLUCOPHAGE TAKE 2 TABLETS BY MOUTH TWICE DAILY.   omeprazole 40 MG capsule Commonly known as: PRILOSEC Take 1 capsule (40 mg total) by mouth 2 (two) times daily before a meal.       Allergies:  Allergies  Allergen Reactions  . Bee Venom Anaphylaxis  . Hydrocodone-Homatropine Other (See Comments)    Hallucinations, confusion, delirium Depressed feeling  . Toradol [Ketorolac Tromethamine] Other (See Comments)    Hallucinations, confusion, delirium  . Morphine And Related Other (See Comments)    Hallucinations, back in the 80s. States has taken vicodin before w/o problems   . Sulfa Drugs Cross Reactors Rash    Past Medical History:  Diagnosis Date  . Acid reflux   . Arthritis   . Chronic headaches    on cymbalta  . Cirrhosis ([place])   . Colon polyps   . Complication of anesthesia    problems waking up from anesthesia  . Depression    on cymbalta  . Diabetes mellitus with neuropathy ([place])   . Diverticulitis 03/2013  . Eczema   . Elevated LFTs   . Fatty liver   . Hepatitis 10/2017   NASH cirrhosis  . History of kidney stones   . Hypertension   . Insomnia 04/26/2013  . Kidney stone   . Morbid obesity ([place])   . Neuromuscular disorder ([facility])    neuropathy   . Neuropathy   . Post-traumatic hydrocephalus ([facility])    s/p shunts x 2 (first got infected )  . Psoriasis    sees Dr [name]  . Psoriatic arthritis ([place])   . 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 Sleep apnea    no CPAP     . Stomach ulcer   . Testosterone deficiency 04/28/2011    Past Surgical History:  Procedure Laterality Date  . BACK SURGERY  1980  . BRAIN SURGERY     VP shunts placed in 2007  . CHOLECYSTECTOMY N/A 08/25/2017   Procedure: LAPAROSCOPIC CHOLECYSTECTOMY WITH INTRAOPERATIVE CHOLANGIOGRAM;  Surgeon: [name], MD;  Location: [facility];  Service: General;  Laterality: N/A;  . COLONOSCOPY    . JOINT REPLACEMENT     total hip  . LUMBAR FUSION  03/16/2019  . SHOULDER SURGERY Left 2010  . TOE SURGERY Left 2018  . TONSILLECTOMY     as a child  . TOTAL HIP ARTHROPLASTY Left 2011  . VENTRICULOPERITONEAL SHUNT  2007   x2    Family History  Problem Relation Age of Onset  . Other Mother   . Lung cancer Father        alive, former smoker   . Heart disease Brother        MI age 5  . Other Brother        Murdered  . Down syndrome  Son   . Diabetes Neg Hx   . Prostate cancer Neg Hx   . Colon cancer Neg Hx   . Stomach cancer Neg Hx   . Pancreatic cancer Neg Hx   . Liver disease Neg Hx     Social History:  reports that he has never smoked. He has never used smokeless tobacco. He reports that he does not drink alcohol and does not use drugs.    Review of Systems     HYPERTENSION: On carvedilol for some time Followed by PCP   BP Readings from Last 3 Encounters:  12/27/19 118/64  11/24/19 129/83  11/09/19 130/74   RENAL dysfunction: Has had variable creatinine levels but persistently mildly abnormal  Lab Results  Component Value Date   CREATININE 1.50 12/21/2019   CREATININE 1.32 10/11/2019   CREATININE 1.50 10/09/2019    Lipid history: He is on Lipitor  20 mg, last LDL over 100 and needs follow-up  Is on  fenofibrate for triglyceride treatment, has increased triglycerides with results as below      Lab Results  Component Value Date   CHOL 172 10/09/2019   HDL 30.90 (L) 10/09/2019   LDLCALC 106 (H) 04/28/2019   LDLDIRECT 83.0 10/09/2019   TRIG 283.0 (H) 10/09/2019   CHOLHDL 6 10/09/2019            Most recent foot exam: 08/2017  He has symptomatic painful neuropathy and is on gabapentin 600 mg twice a day and  Cymbalta 60 mg Symptoms are relatively well controlled but he is concerned about numbness in his feet  Has been followed by neurologist    Review of Systems    Physical Examination:  BP 118/64 (BP Location: Right Arm, Patient Position: Sitting)   Pulse 75   Ht 5' 11"  (1.803 m)   Wt 260 lb 6.4 oz (118.1 kg)   SpO2 94%   BMI 36.32 kg/m   Diabetic Foot Exam - Simple   Simple Foot Form Diabetic Foot exam was performed with the following findings: Yes   Visual Inspection No deformities, no ulcerations, no other skin breakdown bilaterally: Yes Sensation Testing See comments: Yes Pulse Check Posterior Tibialis and Dorsalis pulse intact bilaterally: Yes Comments Absent sensation in the feet        ASSESSMENT:  Diabetes type 2 with obesity  See history of present illness for discussion of current diabetes management, blood sugar patterns and problems identified  Currently on a regimen of Rybelsus, Metformin and 4 mg Amaryl  A1c was previously  8.3 Fructosamine is still relatively high at 315  His main difficulty is postprandial hyperglycemia again which is variable based on his diet He is likely still insulin deficient since Ozempic has not controlled his postprandial readings even with his 10 pound weight loss since his last visit Despite using the freestyle Elenor Legato is not making much changes in the type of meals he is eating with more carbohydrate, sweets and regular soft drinks  NEUROPATHY: Still has significant numbness and discussed that this is likely  irreversible and he needs to improve his blood sugar control for preventing any worsening  PLAN:   No change in Ozempic 0.5 as yet since his appetite is somewhat decreased at this time  Start mealtime insulin coverage with Humalog Today discussed in detail the need for mealtime insulin to cover postprandial spikes, action of mealtime insulin, use of the insulin pen, timing and action of the rapid acting insulin as well as starting dose and dosage  titration to target the two-hour reading of under 180 He will start with 4-8 units for covering his meals and large snacks based on his carbohydrate intake and meal size Avoid regular soft drinks and sweets He will try to make sure he has a protein with every meal Recheck A1c in 2 months Continue Metformin unchanged and renal function is fairly good  Discussed checking the bottom of his feet every day with him  Also to make sure he is taking precautions against falls  Patient Instructions  4-8 Units Humalog before meals and large snack  Avoid Cokes      Elayne Snare 12/27/2019, 12:58 PM

## 2019-12-27 NOTE — Patient Instructions (Signed)
4-8 Units Humalog before meals and large snack  Avoid Cokes

## 2019-12-28 ENCOUNTER — Other Ambulatory Visit: Payer: Self-pay | Admitting: Endocrinology

## 2019-12-28 MED FILL — FENOFIBRATE 134 MG CAPSULE: 134 | 30 days supply | Qty: 15 | Fill #0

## 2019-12-28 MED FILL — DULOXETINE HCL 60 MG CPEP: 60 | 30 days supply | Qty: 60 | Fill #5

## 2020-01-04 ENCOUNTER — Ambulatory Visit (AMBULATORY_SURGERY_CENTER): Payer: 59 | Admitting: Internal Medicine

## 2020-01-04 ENCOUNTER — Other Ambulatory Visit: Payer: Self-pay

## 2020-01-04 ENCOUNTER — Encounter: Payer: Self-pay | Admitting: Internal Medicine

## 2020-01-04 VITALS — BP 110/68 | HR 75 | Temp 97.9°F | Resp 23 | Ht 71.0 in | Wt 270.0 lb

## 2020-01-04 DIAGNOSIS — K7460 Unspecified cirrhosis of liver: Secondary | ICD-10-CM

## 2020-01-04 DIAGNOSIS — K317 Polyp of stomach and duodenum: Secondary | ICD-10-CM

## 2020-01-04 DIAGNOSIS — K3189 Other diseases of stomach and duodenum: Secondary | ICD-10-CM

## 2020-01-04 DIAGNOSIS — D125 Benign neoplasm of sigmoid colon: Secondary | ICD-10-CM | POA: Diagnosis not present

## 2020-01-04 DIAGNOSIS — D123 Benign neoplasm of transverse colon: Secondary | ICD-10-CM | POA: Diagnosis not present

## 2020-01-04 DIAGNOSIS — K766 Portal hypertension: Secondary | ICD-10-CM

## 2020-01-04 DIAGNOSIS — D500 Iron deficiency anemia secondary to blood loss (chronic): Secondary | ICD-10-CM | POA: Diagnosis not present

## 2020-01-04 DIAGNOSIS — D509 Iron deficiency anemia, unspecified: Secondary | ICD-10-CM | POA: Diagnosis not present

## 2020-01-04 HISTORY — PX: UPPER GASTROINTESTINAL ENDOSCOPY: SHX188

## 2020-01-04 MED ORDER — SODIUM CHLORIDE 0.9 % IV SOLN: 500.0000 mL | Freq: Once | INTRAVENOUS | Status: DC

## 2020-01-04 NOTE — Progress Notes (Signed)
Robinul 0.1 mg IV given due large amount of secretions upon assessment.  MD made aware, vss

## 2020-01-04 NOTE — Progress Notes (Signed)
Called to room to assist during endoscopic procedure.  Patient ID and intended procedure confirmed with present staff. Received instructions for my participation in the procedure from the performing physician.  

## 2020-01-04 NOTE — Progress Notes (Signed)
Temp check by:JB Vital check by:CW  The medical and surgical history was reviewed and verified with the patient.

## 2020-01-04 NOTE — Op Note (Signed)
[place] Patient Name: James [name] Procedure Date: 01/04/2020 8:00 AM MRN: [id number] Endoscopist: Docia Chuck. Henrene Pastor , MD Age: 64 Referring MD:  Date of Birth: 06-27-1956 Gender: Male Account #: [id number] Procedure:                Colonoscopy with snare polypectomies, cold x 1; hot                            x 1 Indications:              Iron deficiency anemia. Previous colonoscopy 2015                            with non-adenomatous colon polyp and diverticulosis Medicines:                Monitored Anesthesia Care Procedure:                Pre-Anesthesia Assessment:                           - Prior to the procedure, a History and Physical                            was performed, and patient medications and                            allergies were reviewed. The patient's tolerance of                            previous anesthesia was also reviewed. The risks                            and benefits of the procedure and the sedation                            options and risks were discussed with the patient.                            All questions were answered, and informed consent                            was obtained. Prior Anticoagulants: The patient has                            taken no previous anticoagulant or antiplatelet                            agents. ASA Grade Assessment: III - A patient with                            severe systemic disease. After reviewing the risks                            and benefits, the patient was deemed in  satisfactory condition to undergo the procedure.                           After obtaining informed consent, the colonoscope                            was passed under direct vision. Throughout the                            procedure, the patient's blood pressure, pulse, and                            oxygen saturations were monitored continuously. The                            Colonoscope was  introduced through the anus and                            advanced to the the cecum, identified by                            appendiceal orifice and ileocecal valve. The                            ileocecal valve, appendiceal orifice, and rectum                            were photographed. The quality of the bowel                            preparation was excellent. The colonoscopy was                            performed without difficulty. The patient tolerated                            the procedure well. The bowel preparation used was                            SUPREP via split dose instruction. Scope In: 8:38:57 AM Scope Out: 8:53:21 AM Scope Withdrawal Time: 0 hours 11 minutes 52 seconds  Total Procedure Duration: 0 hours 14 minutes 24 seconds  Findings:                 A 12 mm polyp was found in the sigmoid colon. The                            polyp was pedunculated. The polyp was removed with                            a hot snare. Resection and retrieval were complete.                           A 5 mm polyp was found in  the transverse colon. The                            polyp was removed with a cold snare. Resection and                            retrieval were complete.                           Multiple small and large-mouthed diverticula were                            found in the entire colon.                           Internal hemorrhoids were found during                            retroflexion. The hemorrhoids were moderate.                           The exam was otherwise without abnormality on                            direct and retroflexion views. Complications:            No immediate complications. Estimated blood loss:                            None. Estimated Blood Loss:     Estimated blood loss: none. Recommendation:           - Repeat colonoscopy in 3 - 5 years for                            surveillance.                           - Patient has a  contact number available for                            emergencies. The signs and symptoms of potential                            delayed complications were discussed with the                            patient. Return to normal activities tomorrow.                            Written discharge instructions were provided to the                            patient.                           - Resume previous diet.                           -  Continue present medications.                           - Await pathology results. Docia Chuck. Henrene Pastor, MD 01/04/2020 9:20:12 AM This report has been signed electronically.

## 2020-01-04 NOTE — Patient Instructions (Signed)
Handout on polyps given to you today  Await pathology results    YOU HAD AN ENDOSCOPIC PROCEDURE TODAY AT [facility]:   Refer to the procedure report that was given to you for any specific questions about what was found during the examination.  If the procedure report does not answer your questions, please call your gastroenterologist to clarify.  If you requested that your care partner not be given the details of your procedure findings, then the procedure report has been included in a sealed envelope for you to review at your convenience later.  YOU SHOULD EXPECT: Some feelings of bloating in the abdomen. Passage of more gas than usual.  Walking can help get rid of the air that was put into your GI tract during the procedure and reduce the bloating. If you had a lower endoscopy (such as a colonoscopy or flexible sigmoidoscopy) you may notice spotting of blood in your stool or on the toilet paper. If you underwent a bowel prep for your procedure, you may not have a normal bowel movement for a few days.  Please Note:  You might notice some irritation and congestion in your nose or some drainage.  This is from the oxygen used during your procedure.  There is no need for concern and it should clear up in a day or so.  SYMPTOMS TO REPORT IMMEDIATELY:   Following lower endoscopy (colonoscopy or flexible sigmoidoscopy):  Excessive amounts of blood in the stool  Significant tenderness or worsening of abdominal pains  Swelling of the abdomen that is new, acute  Fever of 100F or higher   Following upper endoscopy (EGD)  Vomiting of blood or coffee ground material  New chest pain or pain under the shoulder blades  Painful or persistently difficult swallowing  New shortness of breath  Fever of 100F or higher  Black, tarry-looking stools  For urgent or emergent issues, a gastroenterologist can be reached at any hour by calling [phone]. Do not use MyChart messaging for  urgent concerns.    DIET:  We do recommend a small meal at first, but then you may proceed to your regular diet.  Drink plenty of fluids but you should avoid alcoholic beverages for 24 hours.  ACTIVITY:  You should plan to take it easy for the rest of today and you should NOT DRIVE or use heavy machinery until tomorrow (because of the sedation medicines used during the test).    FOLLOW UP: Our staff will call the number listed on your records 48-72 hours following your procedure to check on you and address any questions or concerns that you may have regarding the information given to you following your procedure. If we do not reach you, we will leave a message.  We will attempt to reach you two times.  During this call, we will ask if you have developed any symptoms of COVID 19. If you develop any symptoms (ie: fever, flu-like symptoms, shortness of breath, cough etc.) before then, please call [phone].  If you test positive for Covid 19 in the 2 weeks post procedure, please call and report this information to [place].    If any biopsies were taken you will be contacted by phone or by letter within the next 1-3 weeks.  Please call us at [phone] if you have not heard about the biopsies in 3 weeks.    SIGNATURES/CONFIDENTIALITY: You and/or your care partner have signed paperwork which will be entered into your electronic medical  record.  These signatures attest to the fact that that the information above on your After Visit Summary has been reviewed and is understood.  Full responsibility of the confidentiality of this discharge information lies with you and/or your care-partner.

## 2020-01-04 NOTE — Progress Notes (Signed)
A/ox3, pleased with MAC, report to RN 

## 2020-01-04 NOTE — Op Note (Signed)
[facility] Patient Name: James [name] Procedure Date: 01/04/2020 7:59 AM MRN: [id number] Endoscopist: Docia Chuck. Henrene Pastor , MD Age: 64 Referring MD:  Date of Birth: 01-30-1956 Gender: Male Account #: [id number] Procedure:                Upper GI endoscopy with biopsies Indications:              Iron deficiency anemia Medicines:                Monitored Anesthesia Care Procedure:                Pre-Anesthesia Assessment:                           - Prior to the procedure, a History and Physical                            was performed, and patient medications and                            allergies were reviewed. The patient's tolerance of                            previous anesthesia was also reviewed. The risks                            and benefits of the procedure and the sedation                            options and risks were discussed with the patient.                            All questions were answered, and informed consent                            was obtained. Prior Anticoagulants: The patient has                            taken no previous anticoagulant or antiplatelet                            agents. ASA Grade Assessment: III - A patient with                            severe systemic disease. After reviewing the risks                            and benefits, the patient was deemed in                            satisfactory condition to undergo the procedure.                           After obtaining informed consent, the endoscope was  passed under direct vision. Throughout the                            procedure, the patient's blood pressure, pulse, and                            oxygen saturations were monitored continuously. The                            Endoscope was introduced through the mouth, and                            advanced to the second part of duodenum. The upper                            GI endoscopy was  accomplished without difficulty.                            The patient tolerated the procedure well. Scope In: Scope Out: Findings:                 The esophagus was normal. No varices                           The stomach revealed mild changes of portal                            hypertensive gastropathy in the proximal portion.                            In the antrum was the endoscopic appearance of                            watermelon stomach (GAVE). Biopsies were taken with                            a cold forceps for histology to differentiate                            between GAVE and portal hypertensive gastropathy.                            No gastric varices.                           The examined duodenum was normal.                           The cardia and gastric fundus were normal on                            retroflexion. Complications:            No immediate complications. Estimated Blood Loss:     Estimated blood loss: none. Impression:  1. GAVE versus portal hypertensive gastropathy                           2. Otherwise unremarkable exam. No varices                           3. Iron deficiency anemia secondary to #1 above. Recommendation:           1. Resume previous medications and diet                           2. Please take an iron supplement EVERY DAY                           3. Follow-up biopsies                           4. Office follow-up with Dr. Henrene Pastor in 6 to 8 weeks. Docia Chuck. Henrene Pastor, MD 01/04/2020 9:24:51 AM This report has been signed electronically.

## 2020-01-08 ENCOUNTER — Telehealth: Payer: Self-pay

## 2020-01-08 NOTE — Telephone Encounter (Signed)
  Follow up Call-  Call back number 01/04/2020 11/29/2018 11/16/2018  Post procedure Call Back phone  # [phone]  Permission to leave phone message Yes - Yes  Some recent data might be hidden     Patient questions:  Do you have a fever, pain , or abdominal swelling? No. Pain Score  0 *  Have you tolerated food without any problems? Yes.    Have you been able to return to your normal activities? Yes.    Do you have any questions about your discharge instructions: Diet   No. Medications  No. Follow up visit  No.  Do you have questions or concerns about your Care? No.  Actions: * If pain score is 4 or above: No action needed, pain <4.  1. Have you developed a fever since your procedure? no  2.   Have you had an respiratory symptoms (SOB or cough) since your procedure? no  3.   Have you tested positive for COVID 19 since your procedure no  4.   Have you had any family members/close contacts diagnosed with the COVID 19 since your procedure?  no   If yes to any of these questions please route to Joylene John, RN and Erenest Rasher, RN

## 2020-01-10 ENCOUNTER — Other Ambulatory Visit: Payer: Self-pay | Admitting: Endocrinology

## 2020-01-10 ENCOUNTER — Encounter: Payer: Self-pay | Admitting: Internal Medicine

## 2020-01-10 ENCOUNTER — Other Ambulatory Visit: Payer: Self-pay

## 2020-01-10 ENCOUNTER — Ambulatory Visit (INDEPENDENT_AMBULATORY_CARE_PROVIDER_SITE_OTHER): Payer: 59 | Admitting: Internal Medicine

## 2020-01-10 VITALS — BP 122/86 | HR 86 | Temp 98.1°F | Resp 18 | Ht 71.0 in | Wt 258.1 lb

## 2020-01-10 DIAGNOSIS — D500 Iron deficiency anemia secondary to blood loss (chronic): Secondary | ICD-10-CM

## 2020-01-10 LAB — CBC WITH DIFFERENTIAL/PLATELET
Basophils Absolute: 0 10*3/uL (ref 0.0–0.1)
Basophils Relative: 0.6 % (ref 0.0–3.0)
Eosinophils Absolute: 0.2 10*3/uL (ref 0.0–0.7)
Eosinophils Relative: 3 % (ref 0.0–5.0)
HCT: 38.9 % — ABNORMAL LOW (ref 39.0–52.0)
Hemoglobin: 13 g/dL (ref 13.0–17.0)
Lymphocytes Relative: 28.6 % (ref 12.0–46.0)
Lymphs Abs: 1.5 10*3/uL (ref 0.7–4.0)
MCHC: 33.5 g/dL (ref 30.0–36.0)
MCV: 87.2 fl (ref 78.0–100.0)
Monocytes Absolute: 0.4 10*3/uL (ref 0.1–1.0)
Monocytes Relative: 8.4 % (ref 3.0–12.0)
Neutro Abs: 3 10*3/uL (ref 1.4–7.7)
Neutrophils Relative %: 59.4 % (ref 43.0–77.0)
Platelets: 134 10*3/uL — ABNORMAL LOW (ref 150.0–400.0)
RBC: 4.47 Mil/uL (ref 4.22–5.81)
RDW: 19.3 % — ABNORMAL HIGH (ref 11.5–15.5)
WBC: 5.1 10*3/uL (ref 4.0–10.5)

## 2020-01-10 MED ORDER — IRON 325 (65 FE) MG PO TABS: 1.0000 | ORAL_TABLET | Freq: Two times a day (BID) | ORAL | 6 refills | Status: DC

## 2020-01-10 MED ORDER — EPINEPHRINE 0.3 MG/0.3ML IJ SOAJ: 0.3000 mg | Freq: Once | INTRAMUSCULAR | 2 refills | Status: AC | PRN

## 2020-01-10 MED FILL — FERROUS SULFATE 325 MG TAB: 325 (65 FE) | 50 days supply | Qty: 100 | Fill #0

## 2020-01-10 MED FILL — FREESTYLE LIBRE 14 DAY SENS: 28 days supply | Qty: 2 | Fill #0

## 2020-01-10 MED FILL — EPINEPHRINE 0.3 MG AUTO-INJ: 0.3 | 1 days supply | Qty: 2 | Fill #0

## 2020-01-10 NOTE — Progress Notes (Signed)
Subjective:    Patient ID: James [name], male    DOB: 06/11/1956, 64 y.o.   MRN: [id number]  DOS:  01/10/2020 Type of visit - description: ROV Since the last office visit had a GI work-up : notes, procedure report and  biopsies reviewed. He has no major concerns. Denies nausea, vomiting, diarrhea or blood in the stools   Review of Systems See above   Past Medical History:  Diagnosis Date  . Acid reflux   . Arthritis   . Blood transfusion without reported diagnosis   . Chronic headaches    on cymbalta  . Cirrhosis ([place])   . Colon polyps   . Complication of anesthesia    problems waking up from anesthesia  . Depression    on cymbalta  . Diabetes mellitus with neuropathy ([place])   . Diverticulitis 03/2013  . Eczema   . Elevated LFTs   . Fatty liver   . Hepatitis 10/2017   NASH cirrhosis  . History of kidney stones   . Hyperlipidemia   . Hypertension   . Insomnia 04/26/2013  . Kidney stone   . Morbid obesity ([place])   . Neuromuscular disorder ([facility])    neuropathy  . Neuropathy   . Post-traumatic hydrocephalus ([facility])    s/p shunts x 2 (first got infected )  . Psoriasis    sees Dr [name]  . Psoriatic arthritis ([place])   . 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 Sleep apnea    no CPAP     . Stomach ulcer   . Testosterone deficiency 04/28/2011    Past Surgical History:  Procedure Laterality Date  . BACK SURGERY  1980  . BRAIN SURGERY     VP shunts placed in 2007  . CHOLECYSTECTOMY N/A 08/25/2017   Procedure: LAPAROSCOPIC CHOLECYSTECTOMY WITH INTRAOPERATIVE CHOLANGIOGRAM;  Surgeon: [name], MD;  Location: [facility];  Service: General;  Laterality: N/A;  . COLONOSCOPY    . JOINT REPLACEMENT     total hip  . LUMBAR FUSION  03/16/2019  . SHOULDER SURGERY Left 2010  . TOE SURGERY Left 2018  . TONSILLECTOMY     as a child  . TOTAL HIP ARTHROPLASTY Left 2011  . UPPER GASTROINTESTINAL ENDOSCOPY  01/04/2020  .  VENTRICULOPERITONEAL SHUNT  2007   x2    Allergies as of 01/10/2020      Reactions   Bee Venom Anaphylaxis   Hydrocodone-homatropine Other (See Comments)   Hallucinations, confusion, delirium Depressed feeling   Toradol [ketorolac Tromethamine] Other (See Comments)   Hallucinations, confusion, delirium   Morphine And Related Other (See Comments)   Hallucinations, back in the 80s. States has taken vicodin before w/o problems    Sulfa Drugs Cross Reactors Rash      Medication List       Accurate as of January 10, 2020 11:59 PM. If you have any questions, ask your nurse or doctor.        atorvastatin 20 MG tablet Commonly known as: LIPITOR Take 1 tablet (20 mg total) by mouth at bedtime.   carvedilol 12.5 MG tablet Commonly known as: COREG Take 1 tablet (12.5 mg total) by mouth 2 (two) times daily with a meal.   DULoxetine 60 MG capsule Commonly known as: CYMBALTA Take 2 capsules (120 mg total) by mouth daily.   EPINEPHrine 0.3 mg/0.3 mL Soaj injection Commonly known as: EpiPen 2-Pak Inject 0.3 mLs (0.3 mg total) into the muscle once  as needed for up to 1 dose for anaphylaxis. What changed: reasons to take this Changed by: Kathlene November, MD   fenofibrate micronized 134 MG capsule Commonly known as: LOFIBRA TAKE 1 CAPSULE BY MOUTH THREE TIMES WEEKLY   FreeStyle Freedom Lite w/Device Kit Use to check blood sugars 3 times daily. Dx code: E11.9   freestyle lancets Use to check blood sugar 3 times daily. Dx code E11.9   FreeStyle Libre 14 Day Sensor Misc USE AS DIRECTED TO CHECK BLOOD SUGAR   gabapentin 600 MG tablet Commonly known as: NEURONTIN TAKE 1 TABLET BY MOUTH TWICE DAILY   Galcanezumab-gnlm 120 MG/ML Soaj Commonly known as: Emgality Inject 1 pen into the skin every 28 (twenty-eight) days.   glimepiride 4 MG tablet Commonly known as: AMARYL Take 1 tablet (4 mg total) by mouth daily before breakfast.   glucose blood test strip Use Freestyle Freedom Lite test  strips as instructed to check blood sugar 4 times daily.   Humira Pen 40 MG/0.8ML Pnkt Generic drug: Adalimumab Inject 40 mg into the skin every 14 (fourteen) days.   insulin lispro 100 UNIT/ML KwikPen Commonly known as: HumaLOG KwikPen 4-8 units before each meal   Iron 325 (65 Fe) MG Tabs Take 1 tablet (325 mg total) by mouth 2 (two) times daily before a meal. Started by: Kathlene November, MD   levETIRAcetam 750 MG tablet Commonly known as: KEPPRA Take 1 tablet (750 mg total) by mouth 2 (two) times daily.   metFORMIN 500 MG tablet Commonly known as: GLUCOPHAGE TAKE 2 TABLETS BY MOUTH TWICE DAILY.   omeprazole 40 MG capsule Commonly known as: PRILOSEC Take 1 capsule (40 mg total) by mouth 2 (two) times daily before a meal.          Objective:   Physical Exam BP 122/86 (BP Location: Left Arm, Patient Position: Sitting, Cuff Size: Normal)   Pulse 86   Temp 98.1 F (36.7 C) (Oral)   Resp 18   Ht 5' 11"  (1.803 m)   Wt 258 lb 2 oz (117.1 kg)   SpO2 97%   BMI 36.00 kg/m   General:   Well developed, NAD, BMI noted. HEENT:  Normocephalic . Face symmetric, atraumatic Lungs:  CTA B Normal respiratory effort, no intercostal retractions, no accessory muscle use. Heart: RRR,  no murmur.  Lower extremities: no pretibial edema bilaterally  Skin: Not pale. Not jaundice Neurologic:  alert & oriented X3.  Speech normal, gait appropriate for age and unassisted Psych--  Cognition and judgment appear intact.  Cooperative with normal attention span and concentration.  Behavior appropriate. No anxious or depressed appearing.      Assessment      Assessment  DM - Dr [name] Neuropathy (x years, rx gaba 05-2014, w/u 11-2014  RPR neg, vit D-B12-Folic Acid wnl ); Saw Dr Posey Pronto, NCS [phone] (see results) HTN: History of AKI with ARBs  CRI, sees nephrology, etiology felt to beHyperglycemia, intermittent NSAIDs, contrast exposures Hyperlipidemia (TG in the 500s 2016) OSA , dx 2012, sleep  study again 02-2015 Dr Dohmeier--> severe OSA, intolerant to CPAP Depression, insomnia: on Cymbalta NEURO: --Chronic headaches :on Cymbalta  --Posttraumatic hydrocephalus s/p 2 shunts (first got infected) MSK: on disability d/t back pain- HAs GI:  --GERD, diverticulitis 2014, h/o PUD -- NASH with cirrhosis per GI note 10/2017, s/p Hep A/B shots --Anemia: - felt to be d/t   GAVE (gastric antral vascular ectasia) and a inflammatory polyp, s/p  EGD 10/2018    -Work-up repeated  10/2019: EGD: GAVE  versus portal hypertensive gastropathy. Colonoscopy polyps.  Tubular adenoma. Gastric BX negative H. pylori, reactive changes. Psoriasis, psoriatic arthritis: used  HUMIRA  CV: +FH CAD brother MI age 62 Abnormal EKG, saw cardiology 01-2019, echo essentially negative, perfusion stress test 03/01/2019 (-) H/o urolithiasis Hypogonadism  Dx 2012, normal T 11-2014 (on no RX)  PLAN DM: Last visit with endocrinology 12/27/2019 Migraines: Saw neurology in May, he failed a number of medicines, was recommended Botox Anemia:  Saw GI 10/2019 d/t  rectal bleeding & iron deficiency.  EGD: GAVE  versus portal hypertensive gastropathy. Colonoscopy polyps.  Tubular adenoma. Gastric BX negative H. pylori, reactive changes. Suspect anemia likely from gave  Plan: Currently on OTC iron, send a prescription for 325 mg of iron twice a day.  Check CBC and TIBC. Allergies: Request a refill on EpiPen, sent. RTC CPX 4 months    This visit occurred during the SARS-CoV-2 public health emergency.  Safety protocols were in place, including screening questions prior to the visit, additional usage of staff PPE, and extensive cleaning of exam room while observing appropriate contact time as indicated for disinfecting solutions.

## 2020-01-10 NOTE — Progress Notes (Signed)
Pre visit review using our clinic review tool, if applicable. No additional management support is needed unless otherwise documented below in the visit note. 

## 2020-01-10 NOTE — Patient Instructions (Addendum)
Please schedule Medicare Wellness with [facility].   Per our records you are due for an eye exam. Please contact your eye doctor to schedule an appointment. Please have them send copies of your office visit notes to [place]. Our fax number is [phone].  Take the prescribed iron 325 mg twice a day on a empty stomach   GO TO THE LAB : Get the blood work     [facility] back for    a physical exam in 4 months

## 2020-01-11 LAB — IRON,TIBC AND FERRITIN PANEL
%SAT: 16 % (calc) — ABNORMAL LOW (ref 20–48)
Ferritin: 35 ng/mL (ref 24–380)
Iron: 62 ug/dL (ref 50–180)
TIBC: 377 mcg/dL (calc) (ref 250–425)

## 2020-01-11 NOTE — Assessment & Plan Note (Addendum)
DM: Last visit with endocrinology 12/27/2019 Migraines: Saw neurology in May, he failed a number of medicines, was recommended Botox Anemia:  Saw GI 10/2019 d/t  rectal bleeding & iron deficiency.  EGD: GAVE  versus portal hypertensive gastropathy. Colonoscopy polyps.  Tubular adenoma. Gastric BX negative H. pylori, reactive changes. Suspect anemia likely from gave  Plan: Currently on OTC iron, send a prescription for 325 mg of iron twice a day.  Check CBC and TIBC. Allergies: Request a refill on EpiPen, sent. RTC CPX 4 months

## 2020-01-12 ENCOUNTER — Encounter: Payer: Self-pay | Admitting: Internal Medicine

## 2020-01-15 ENCOUNTER — Ambulatory Visit (INDEPENDENT_AMBULATORY_CARE_PROVIDER_SITE_OTHER): Payer: 59 | Admitting: Neurology

## 2020-01-15 ENCOUNTER — Other Ambulatory Visit: Payer: Self-pay

## 2020-01-15 VITALS — BP 123/86 | HR 75 | Ht 71.0 in | Wt 257.5 lb

## 2020-01-15 DIAGNOSIS — G4733 Obstructive sleep apnea (adult) (pediatric): Secondary | ICD-10-CM | POA: Diagnosis not present

## 2020-01-15 DIAGNOSIS — Z982 Presence of cerebrospinal fluid drainage device: Secondary | ICD-10-CM | POA: Diagnosis not present

## 2020-01-15 DIAGNOSIS — G43709 Chronic migraine without aura, not intractable, without status migrainosus: Secondary | ICD-10-CM | POA: Diagnosis not present

## 2020-01-15 DIAGNOSIS — IMO0002 Reserved for concepts with insufficient information to code with codable children: Secondary | ICD-10-CM
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
## 2020-01-23 MED FILL — HUMIRA PEN 40 MG/0.8ML PNKT: 40 | 28 days supply | Qty: 2 | Fill #5

## 2020-01-25 ENCOUNTER — Telehealth: Payer: Self-pay | Admitting: Neurology

## 2020-01-25 NOTE — Telephone Encounter (Signed)
Pt called having stabbing pain at the Botox injection site.Pain started Wednesday,7/21/21Pt would like a call from the nurse.

## 2020-01-25 NOTE — Telephone Encounter (Signed)
Called pt back. Relayed Dr. Garth Bigness message. He declined, states pain meds "mess with my head". He would prefer to take OTC meds. Recommended he try heat/ice. Advised him to call back next week if sx do not improve. He verbalized understanding.

## 2020-01-25 NOTE — Telephone Encounter (Signed)
Called pt to get further information. He has been having Injection site pain that started last Wednesday. Denies any drainage. Pain located on left side of neck. Wanting to know what Dr. Felecia Shelling would recommend. Advised I will send him a message and call back with his recommendation.

## 2020-01-25 NOTE — Telephone Encounter (Signed)
Please send in a prescription for tramadol 50 mg #15  1 p.o. 3 times daily as needed no refills

## 2020-02-01 ENCOUNTER — Other Ambulatory Visit: Payer: Self-pay | Admitting: Internal Medicine

## 2020-02-01 ENCOUNTER — Other Ambulatory Visit: Payer: Self-pay | Admitting: Neurology

## 2020-02-01 ENCOUNTER — Other Ambulatory Visit: Payer: Self-pay | Admitting: Endocrinology

## 2020-02-01 MED FILL — DULOXETINE HCL 60 MG CPEP: 60 | 90 days supply | Qty: 180 | Fill #0

## 2020-02-01 MED FILL — OMEPRAZOLE 40 MG CPDR: 40 | 90 days supply | Qty: 180 | Fill #2

## 2020-02-01 MED FILL — FREESTYLE LIBRE 14 DAY SENS: 28 days supply | Qty: 2 | Fill #1

## 2020-02-01 MED FILL — METFORMIN HCL 500 MG TABS: 500 | 30 days supply | Qty: 120 | Fill #0

## 2020-02-01 MED FILL — GLIMEPIRIDE 4 MG TABLET: 4 | 30 days supply | Qty: 30 | Fill #0

## 2020-02-01 MED FILL — GABAPENTIN 600 MG TABLET: 600 | 90 days supply | Qty: 180 | Fill #0

## 2020-02-08 DIAGNOSIS — L409 Psoriasis, unspecified: Secondary | ICD-10-CM | POA: Diagnosis not present

## 2020-02-08 DIAGNOSIS — Z79899 Other long term (current) drug therapy: Secondary | ICD-10-CM | POA: Diagnosis not present

## 2020-02-14 ENCOUNTER — Other Ambulatory Visit: Payer: Self-pay | Admitting: Pharmacist

## 2020-02-14 MED ORDER — HUMIRA PEN 40 MG/0.8ML ~~LOC~~ PNKT: 40.0000 mg | PEN_INJECTOR | SUBCUTANEOUS | 5 refills | Status: DC

## 2020-02-15 ENCOUNTER — Other Ambulatory Visit: Payer: 59

## 2020-02-19 MED FILL — HUMIRA PEN 40 MG/0.8ML PNKT: 40 | 28 days supply | Qty: 2 | Fill #0

## 2020-02-20 ENCOUNTER — Ambulatory Visit: Payer: 59 | Admitting: Endocrinology

## 2020-02-24 ENCOUNTER — Ambulatory Visit: Payer: 59 | Attending: Internal Medicine

## 2020-02-24 DIAGNOSIS — Z23 Encounter for immunization: Secondary | ICD-10-CM

## 2020-02-24 NOTE — Progress Notes (Signed)
   Covid-19 Vaccination Clinic  Name:  SLY PARLEE    MRN: [id number] DOB: 05-09-1956  02/24/2020  Mr. Heisler was observed post Covid-19 immunization for 30 minutes based on pre-vaccination screening without incident. He was provided with Vaccine Information Sheet and instruction to access the V-Safe system.   Mr. Kauth was instructed to call 911 with any severe reactions post vaccine: Marland Kitchen Difficulty breathing  . Swelling of face and throat  . A fast heartbeat  . A bad rash all over body  . Dizziness and weakness

## 2020-02-29 MED FILL — OZEMPIC 0.25 OR 0.5 MG/DOSE: 2 | 28 days supply | Qty: 2 | Fill #2

## 2020-03-06 ENCOUNTER — Ambulatory Visit: Payer: 59 | Admitting: Internal Medicine

## 2020-03-12 ENCOUNTER — Encounter: Payer: Self-pay | Admitting: Internal Medicine

## 2020-03-12 ENCOUNTER — Ambulatory Visit (INDEPENDENT_AMBULATORY_CARE_PROVIDER_SITE_OTHER): Payer: 59 | Admitting: Internal Medicine

## 2020-03-12 ENCOUNTER — Other Ambulatory Visit (INDEPENDENT_AMBULATORY_CARE_PROVIDER_SITE_OTHER): Payer: 59

## 2020-03-12 VITALS — BP 130/90 | HR 80 | Ht 69.5 in | Wt 251.2 lb

## 2020-03-12 DIAGNOSIS — Z86010 Personal history of colonic polyps: Secondary | ICD-10-CM

## 2020-03-12 DIAGNOSIS — K31819 Angiodysplasia of stomach and duodenum without bleeding: Secondary | ICD-10-CM | POA: Diagnosis not present

## 2020-03-12 DIAGNOSIS — K7581 Nonalcoholic steatohepatitis (NASH): Secondary | ICD-10-CM

## 2020-03-12 DIAGNOSIS — D509 Iron deficiency anemia, unspecified: Secondary | ICD-10-CM

## 2020-03-12 DIAGNOSIS — K766 Portal hypertension: Secondary | ICD-10-CM | POA: Diagnosis not present

## 2020-03-12 DIAGNOSIS — K3189 Other diseases of stomach and duodenum: Secondary | ICD-10-CM | POA: Diagnosis not present

## 2020-03-12 DIAGNOSIS — K7460 Unspecified cirrhosis of liver: Secondary | ICD-10-CM | POA: Diagnosis not present

## 2020-03-12 LAB — CBC WITH DIFFERENTIAL/PLATELET
Basophils Absolute: 0 10*3/uL (ref 0.0–0.1)
Basophils Relative: 0.5 % (ref 0.0–3.0)
Eosinophils Absolute: 0.3 10*3/uL (ref 0.0–0.7)
Eosinophils Relative: 5.4 % — ABNORMAL HIGH (ref 0.0–5.0)
HCT: 43 % (ref 39.0–52.0)
Hemoglobin: 14.4 g/dL (ref 13.0–17.0)
Lymphocytes Relative: 30.7 % (ref 12.0–46.0)
Lymphs Abs: 1.9 10*3/uL (ref 0.7–4.0)
MCHC: 33.4 g/dL (ref 30.0–36.0)
MCV: 89.6 fl (ref 78.0–100.0)
Monocytes Absolute: 0.4 10*3/uL (ref 0.1–1.0)
Monocytes Relative: 6.2 % (ref 3.0–12.0)
Neutro Abs: 3.5 10*3/uL (ref 1.4–7.7)
Neutrophils Relative %: 57.2 % (ref 43.0–77.0)
Platelets: 159 10*3/uL (ref 150.0–400.0)
RBC: 4.8 Mil/uL (ref 4.22–5.81)
RDW: 16.1 % — ABNORMAL HIGH (ref 11.5–15.5)
WBC: 6.1 10*3/uL (ref 4.0–10.5)

## 2020-03-12 LAB — FERRITIN: Ferritin: 71.7 ng/mL (ref 22.0–322.0)

## 2020-03-12 NOTE — Progress Notes (Signed)
HISTORY OF PRESENT ILLNESS:  James [name] is a 64 y.o. male with compensated NASH cirrhosis, morbid obesity, GERD, history of diverticulitis, prior cholecystectomy, and multiple medical problems including diabetes mellitus.  He was evaluated in the office Nov 09, 2019 regarding iron deficiency anemia.  See that dictation for details.  At that time his meld score was 10.  He underwent abdominal ultrasound for [place] screening 18 2021.  This was negative.  His hemoglobin prior to that visit was 11.1.  Upper endoscopy and colonoscopy were performed January 04, 2020.  Colonoscopy revealed advanced nonadvanced tubular adenomas.  Follow-up in 3 years recommended..  Incidental findings include diverticulosis and hemorrhoids.  Upper endoscopy revealed changes of portal hypertensive gastropathy in the proximal portion.  Antrum revealed changes were consistent with GAVE.  Biopsies were taken consistent with that diagnosis.  He was instructed to take iron every day.  He was told to follow-up at this time, and does.  Patient has not been particularly compliant with iron therapy.  He acknowledges this.  He tolerated iron just fine.  His last hemoglobin January 10, 2020 was 13.0.  He denies GI bleeding.  No active complaints.  He continues on omeprazole for GERD.  He has been fully vaccinated against Covid.  REVIEW OF SYSTEMS:  All non-GI ROS negative unless otherwise stated in the HPI except for arthritis  Past Medical History:  Diagnosis Date  . Acid reflux   . Arthritis   . Blood transfusion without reported diagnosis   . Chronic headaches    on cymbalta  . Cirrhosis ([place])   . Colon polyps   . Complication of anesthesia    problems waking up from anesthesia  . Depression    on cymbalta  . Diabetes mellitus with neuropathy ([place])   . Diverticulitis 03/2013  . Eczema   . Elevated LFTs   . Fatty liver   . Hepatitis 10/2017   NASH cirrhosis  . History of kidney stones   . Hyperlipidemia   . Hypertension   .  Insomnia 04/26/2013  . Kidney stone   . Morbid obesity ([place])   . Neuromuscular disorder (HCC)    neuropathy  . Neuropathy   . Post-traumatic hydrocephalus (HCC)    s/p shunts x 2 (first got infected )  . Psoriasis    sees Dr [name]  . Psoriatic arthritis ([place])   . 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 Sleep apnea    no CPAP     . Stomach ulcer   . Testosterone deficiency 04/28/2011    Past Surgical History:  Procedure Laterality Date  . BACK SURGERY  1980  . BRAIN SURGERY     VP shunts placed in 2007  . CHOLECYSTECTOMY N/A 08/25/2017   Procedure: LAPAROSCOPIC CHOLECYSTECTOMY WITH INTRAOPERATIVE CHOLANGIOGRAM;  Surgeon: [name], MD;  Location: [facility];  Service: General;  Laterality: N/A;  . COLONOSCOPY    . JOINT REPLACEMENT     total hip  . LUMBAR FUSION  03/16/2019  . SHOULDER SURGERY Left 2010  . TOE SURGERY Left 2018  . TONSILLECTOMY     as a child  . TOTAL HIP ARTHROPLASTY Left 2011  . UPPER GASTROINTESTINAL ENDOSCOPY  01/04/2020  . VENTRICULOPERITONEAL SHUNT  2007   x2    Social History XAVIEN DAUPHINAIS  reports that he has never smoked. He has never used smokeless tobacco. He reports that he does not drink alcohol and does not use drugs.  family history includes Down syndrome in his son; Heart disease in his brother; Lung cancer in his father; Other in his brother and mother.  Allergies  Allergen Reactions  . Bee Venom Anaphylaxis  . Hydrocodone-Homatropine Other (See Comments)    Hallucinations, confusion, delirium Depressed feeling  . Toradol [Ketorolac Tromethamine] Other (See Comments)    Hallucinations, confusion, delirium  . Morphine And Related Other (See Comments)    Hallucinations, back in the 80s. States has taken vicodin before w/o problems   . Sulfa Drugs Cross Reactors Rash       PHYSICAL EXAMINATION: Vital signs: BP 130/90 (BP Location: Left Arm, Patient Position: Sitting, Cuff Size: Normal)    Pulse 80   Ht 5' 9.5" (1.765 m) Comment: height measured without shoes  Wt 251 lb 4 oz (114 kg)   BMI 36.57 kg/m   Constitutional: Overweight but generally well-appearing, no acute distress Psychiatric: alert and oriented x3, cooperative Eyes: extraocular movements intact, anicteric, conjunctiva pink Mouth: oral pharynx moist, no lesions Neck: supple no lymphadenopathy Cardiovascular: heart regular rate and rhythm, no murmur Lungs: clear to auscultation bilaterally Abdomen: soft, obese, nontender, nondistended, no obvious ascites, no peritoneal signs, normal bowel sounds, no organomegaly Rectal: Omitted Extremities: no clubbing or cyanosis.  1+ lower extremity edema bilaterally Skin: no lesions on visible extremities Neuro: No focal deficits. No asterixis.    ASSESSMENT:  1.  Deficiency anemia secondary to GAVE/portal hypertensive gastropathy. 2.  No varices on EGD 3.  GERD 4.  Advanced adenomatous colon polyps 5.  NASH cirrhosis.  Compensated.  MELD score of 10.  [place] screening up-to-date.  Vaccinations up-to-date.   PLAN:  1.  Instructed to resume iron therapy daily 2.  Repeat CBC and ferritin today 3.  Schedule follow-up ultrasound of the liver in 3 months for [place] screening 4.  Surveillance colonoscopy in 3 years 5.  Routine office follow-up 1 year.  Contact the office in the interim for any questions or problems. 6.  Ongoing general medical care with Dr. [name] Total time of 30 minutes was spent preparing to see the patient, reviewing x-rays, lab tests, endoscopy findings, and pathology.  Obtaining comprehensive history, performing comprehensive exam, counseling the patient regarding his above listed issues, ordering blood work and advanced radiology.  Documenting clinical information in the health record.

## 2020-03-12 NOTE — Patient Instructions (Signed)
Your provider has requested that you go to the basement level for lab work before leaving today. Press "B" on the elevator. The lab is located at the first door on the left as you exit the elevator.  Make sure to take your iron daily.  You are going to be due for an ultrasound in three months.  I will call you to schedule this.

## 2020-03-18 MED FILL — HUMIRA PEN 40 MG/0.8ML PNKT: 40 | 28 days supply | Qty: 2 | Fill #1

## 2020-03-18 MED FILL — FREESTYLE LIBRE 14 DAY SENS: 28 days supply | Qty: 2 | Fill #2

## 2020-03-19 ENCOUNTER — Other Ambulatory Visit (INDEPENDENT_AMBULATORY_CARE_PROVIDER_SITE_OTHER): Payer: 59

## 2020-03-19 ENCOUNTER — Other Ambulatory Visit: Payer: Self-pay

## 2020-03-19 DIAGNOSIS — E1165 Type 2 diabetes mellitus with hyperglycemia: Secondary | ICD-10-CM

## 2020-03-19 DIAGNOSIS — E1142 Type 2 diabetes mellitus with diabetic polyneuropathy: Secondary | ICD-10-CM

## 2020-03-19 DIAGNOSIS — IMO0002 Reserved for concepts with insufficient information to code with codable children: Secondary | ICD-10-CM

## 2020-03-19 LAB — BASIC METABOLIC PANEL
BUN: 21 mg/dL (ref 6–23)
CO2: 24 mEq/L (ref 19–32)
Calcium: 9.5 mg/dL (ref 8.4–10.5)
Chloride: 100 mEq/L (ref 96–112)
Creatinine, Ser: 1.36 mg/dL (ref 0.40–1.50)
GFR: 53.09 mL/min — ABNORMAL LOW (ref >60.00)
Glucose, Bld: 310 mg/dL — ABNORMAL HIGH (ref 70–99)
Potassium: 4.3 mEq/L (ref 3.5–5.1)
Sodium: 133 mEq/L — ABNORMAL LOW (ref 135–145)

## 2020-03-19 LAB — MICROALBUMIN / CREATININE URINE RATIO
Creatinine,U: 153.2 mg/dL
Microalb Creat Ratio: 0.6 mg/g (ref 0.0–30.0)
Microalb, Ur: 0.9 mg/dL (ref 0.0–1.9)

## 2020-03-19 LAB — HEMOGLOBIN A1C: Hgb A1c MFr Bld: 8.1 % — ABNORMAL HIGH (ref 4.6–6.5)

## 2020-03-21 ENCOUNTER — Ambulatory Visit: Payer: 59 | Admitting: Endocrinology

## 2020-03-21 ENCOUNTER — Ambulatory Visit (INDEPENDENT_AMBULATORY_CARE_PROVIDER_SITE_OTHER): Payer: 59 | Admitting: Endocrinology

## 2020-03-21 ENCOUNTER — Encounter: Payer: Self-pay | Admitting: Endocrinology

## 2020-03-21 ENCOUNTER — Other Ambulatory Visit: Payer: Self-pay

## 2020-03-21 VITALS — BP 122/88 | HR 82 | Ht 69.0 in | Wt 255.0 lb

## 2020-03-21 DIAGNOSIS — E1142 Type 2 diabetes mellitus with diabetic polyneuropathy: Secondary | ICD-10-CM | POA: Diagnosis not present

## 2020-03-21 DIAGNOSIS — E1165 Type 2 diabetes mellitus with hyperglycemia: Secondary | ICD-10-CM

## 2020-03-21 DIAGNOSIS — IMO0002 Reserved for concepts with insufficient information to code with codable children: Secondary | ICD-10-CM

## 2020-03-21 LAB — GLUCOSE, POCT (MANUAL RESULT ENTRY): POC Glucose: 154 mg/dl — AB (ref 70–99)

## 2020-03-21 MED ORDER — OZEMPIC (1 MG/DOSE) 2 MG/1.5ML ~~LOC~~ SOPN: 1.0000 mg | PEN_INJECTOR | SUBCUTANEOUS | 2 refills | Status: DC

## 2020-03-21 NOTE — Patient Instructions (Addendum)
Humalog 10-14 at meals daily based on meal size and keep sugar <180 after meals  Set reminder on phone for Ozempic weekly  Take 2 shots of Ozempic 0.70m weekly

## 2020-03-21 NOTE — Progress Notes (Signed)
Patient ID: James [name], male   DOB: Jan 01, 1956, 64 y.o.   MRN: [id number]           Reason for Appointment: Follow-up for Type 2 Diabetes   History of Present Illness:          Date of diagnosis of type 2 diabetes mellitus:  ?  2014      Background history:  He is not clear how his diabetes was diagnosed, likely on routine testing. Initially had been treated with metformin and also tried on Amaryl Patient had progressive increase in his blood sugars since 1/17 with stopping metformin and being on the regimen of Amaryl and Januvia, he thinks his blood sugars went up to 601.  He was then started on basal bolus insulin   Lowest A1c was 6.8 previously  Recent history:    Non-insulin hypoglycemic drugs the patient is taking are:  Amaryl 13m , metformin 1000 mg twice daily, Ozempic 0.5 mg weekly  Humalog irregularly  His A1c is 8.1 and about the same   Current management, blood sugar patterns and problems identified:   He was advised to start 6 to 8 units of Humalog and increase the dose as needed for his high postprandial readings 3 months ago  However he has not taken his Humalog as he thinks that he is forgetting to take it when he is eating out  However even though he is eating at home most of the time he is likely not taking it as he does not remember what dose he is taking  Occasionally may forget his Ozempic also on a weekly basis  His blood sugars are progressively higher between morning and bedtime despite taking Amaryl 4 mg in the morning  Has not lost weight despite continuing Ozempic  He says he will drink 1 or 2 cans of soft drinks on a daily basis although not all at one time  Not losing weight  His blood sugar was 311 next morning when he had less lab work even though he thinks he was fasting  However FASTING blood sugars at home are usually near normal   Side  effects from medications have been: None  Compliance with the medical regimen:  Fair  Glucose monitoring:  done up to 5 times a day         Glucometer:  Freestyle libre   CONTINUOUS GLUCOSE MONITORING RECORD INTERPRETATION    Dates of Recording: Last 2 weeks  Sensor description: LElenor Legato Results statistics:   CGM use % of time  51  Average and SD   Time in range       53%  % Time Above 180  47  % Time above 250   % Time Below target 0    PRE-MEAL Fasting Lunch Dinner Bedtime Overall  Glucose range:       Mean/median:  137  188  210  157    POST-MEAL PC Breakfast PC Lunch PC Dinner  Glucose range:     Mean/median:   212  232    Glycemic patterns summary: His blood sugar in the office with fingerstick is comparable to the freestyle libre reading Blood sugars are progressively higher between 6 AM and midnight with moderate variability  Hyperglycemic episodes are occurring frequently in the late afternoon and late evening  Hypoglycemic episodes not present  Overnight periods: Blood sugars are averaging about 225 at midnight and then come down significantly by 5 AM to about 140  Preprandial periods:  Usually not eating breakfast Blood sugars at lunch and dinner are averaging about 180-200  Postprandial periods:     After lunch:   Has variably high readings after lunch but average increases only about 40 mg After dinner: Blood sugar patterns are similar with variable increase are no increase after dinner compared to [place] average blood sugar of about 210   Previous data:  CGM use % of time  86  2-week average/SD  161+/-31  Time in range        66% was 70  % Time Above 180  29  % Time above 250 4  % Time Below 70 1     PRE-MEAL Fasting Lunch Dinner Bedtime Overall  Glucose range:       Averages:  132   160  184  161   POST-MEAL PC Breakfast PC Lunch PC Dinner  Glucose range:     Averages:  160  201  180     Self-care:   Meal times are:  Breakfast variable, lunch: 12-3 PM Dinner: 6 PM    Dietician visit, most recent:09/2015                CDE consultation: 12/2018  Weight history:  Wt Readings from Last 3 Encounters:  03/21/20 255 lb (115.7 kg)  03/12/20 251 lb 4 oz (114 kg)  01/15/20 257 lb 8 oz (116.8 kg)    Glycemic control:   Lab Results  Component Value Date   HGBA1C 8.1 (H) 03/19/2020   HGBA1C 8.3 (H) 10/11/2019   HGBA1C 7.6 (H) 04/28/2019   Lab Results  Component Value Date   MICROALBUR 0.9 03/19/2020   LDLCALC 106 (H) 04/28/2019   CREATININE 1.36 03/19/2020    Office Visit on 03/21/2020  Component Date Value Ref Range Status  . POC Glucose 03/21/2020 154* 70 - 99 mg/dl Final  Lab on 03/19/2020  Component Date Value Ref Range Status  . Microalb, Ur 03/19/2020 0.9  0.0 - 1.9 mg/dL Final  . Creatinine,U 03/19/2020 153.2  mg/dL Final  . Microalb Creat Ratio 03/19/2020 0.6  0.0 - 30.0 mg/g Final  . Sodium 03/19/2020 133* 135 - 145 mEq/L Final  . Potassium 03/19/2020 4.3  3.5 - 5.1 mEq/L Final  . Chloride 03/19/2020 100  96 - 112 mEq/L Final  . CO2 03/19/2020 24  19 - 32 mEq/L Final  . Glucose, Bld 03/19/2020 310* 70 - 99 mg/dL Final  . BUN 03/19/2020 21  6 - 23 mg/dL Final  . Creatinine, Ser 03/19/2020 1.36  0.40 - 1.50 mg/dL Final  . GFR 03/19/2020 53.09* >60.00 mL/min Final  . Calcium 03/19/2020 9.5  8.4 - 10.5 mg/dL Final  . Hgb A1c MFr Bld 03/19/2020 8.1* 4.6 - 6.5 % Final   Glycemic Control Guidelines for People with Diabetes:Non Diabetic:  <6%Goal of Therapy: <7%Additional Action Suggested:  >8%        Allergies as of 03/21/2020      Reactions   Bee Venom Anaphylaxis   Hydrocodone-homatropine Other (See Comments)   Hallucinations, confusion, delirium Depressed feeling   Toradol [ketorolac Tromethamine] Other (See Comments)   Hallucinations, confusion, delirium   Morphine And Related Other (See Comments)   Hallucinations, back in the 80s. States has taken vicodin before w/o problems    Sulfa Drugs Cross Reactors Rash      Medication List       Accurate as of March 21, 2020  3:11 PM. If you have any questions, ask your  nurse or doctor.        atorvastatin 20 MG tablet Commonly known as: LIPITOR Take 1 tablet (20 mg total) by mouth at bedtime.   carvedilol 12.5 MG tablet Commonly known as: COREG Take 1 tablet (12.5 mg total) by mouth 2 (two) times daily with a meal.   DULoxetine 60 MG capsule Commonly known as: CYMBALTA Take 2 capsules (120 mg total) by mouth daily.   EPINEPHrine 0.3 mg/0.3 mL Soaj injection Commonly known as: EpiPen 2-Pak Inject 0.3 mLs (0.3 mg total) into the muscle once as needed for up to 1 dose for anaphylaxis.   fenofibrate micronized 134 MG capsule Commonly known as: LOFIBRA TAKE 1 CAPSULE BY MOUTH THREE TIMES WEEKLY   FreeStyle Freedom Lite w/Device Kit Use to check blood sugars 3 times daily. Dx code: E11.9   freestyle lancets Use to check blood sugar 3 times daily. Dx code E11.9   FreeStyle Libre 14 Day Sensor Misc USE AS DIRECTED TO CHECK BLOOD SUGAR   gabapentin 600 MG tablet Commonly known as: NEURONTIN Take 1 tablet (600 mg total) by mouth 2 (two) times daily.   glimepiride 4 MG tablet Commonly known as: AMARYL TAKE 1 TABLET BY MOUTH ONCE DAILY BEFORE BREAKFAST   glucose blood test strip Use Freestyle Freedom Lite test strips as instructed to check blood sugar 4 times daily.   Humira Pen 40 MG/0.8ML Pnkt Generic drug: Adalimumab Inject 40 mg into the skin every 14 (fourteen) days.   insulin lispro 100 UNIT/ML KwikPen Commonly known as: HumaLOG KwikPen 4-8 units before each meal   Iron 325 (65 Fe) MG Tabs Take 1 tablet (325 mg total) by mouth 2 (two) times daily before a meal.   levETIRAcetam 750 MG tablet Commonly known as: KEPPRA Take 1 tablet (750 mg total) by mouth 2 (two) times daily.   metFORMIN 500 MG tablet Commonly known as: GLUCOPHAGE TAKE 2 TABLETS BY MOUTH TWICE DAILY.   omeprazole 40 MG capsule Commonly known as: PRILOSEC Take 1 capsule (40 mg total) by mouth 2 (two) times  daily before a meal.   Ozempic (0.25 or 0.5 MG/DOSE) 2 MG/1.5ML Sopn Generic drug: Semaglutide(0.25 or 0.5MG/DOS) Inject into the skin once a week.       Allergies:  Allergies  Allergen Reactions  . Bee Venom Anaphylaxis  . Hydrocodone-Homatropine Other (See Comments)    Hallucinations, confusion, delirium Depressed feeling  . Toradol [Ketorolac Tromethamine] Other (See Comments)    Hallucinations, confusion, delirium  . Morphine And Related Other (See Comments)    Hallucinations, back in the 80s. States has taken vicodin before w/o problems   . Sulfa Drugs Cross Reactors Rash    Past Medical History:  Diagnosis Date  . Acid reflux   . Arthritis   . Blood transfusion without reported diagnosis   . Chronic headaches    on cymbalta  . Cirrhosis ([place])   . Colon polyps   . Complication of anesthesia    problems waking up from anesthesia  . Depression    on cymbalta  . Diabetes mellitus with neuropathy ([place])   . Diverticulitis 03/2013  . Eczema   . Elevated LFTs   . Fatty liver   . Hepatitis 10/2017   NASH cirrhosis  . History of kidney stones   . Hyperlipidemia   . Hypertension   . Insomnia 04/26/2013  . Kidney stone   . Morbid obesity ([place])   . Neuromuscular disorder (HCC)    neuropathy  . Neuropathy   .  Post-traumatic hydrocephalus (HCC)    s/p shunts x 2 (first got infected )  . Psoriasis    sees Dr [name]  . Psoriatic arthritis ([place])   . 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 Sleep apnea    no CPAP     . Stomach ulcer   . Testosterone deficiency 04/28/2011    Past Surgical History:  Procedure Laterality Date  . BACK SURGERY  1980  . BRAIN SURGERY     VP shunts placed in 2007  . CHOLECYSTECTOMY N/A 08/25/2017   Procedure: LAPAROSCOPIC CHOLECYSTECTOMY WITH INTRAOPERATIVE CHOLANGIOGRAM;  Surgeon: [name], MD;  Location: [facility];  Service: General;  Laterality: N/A;  . COLONOSCOPY    . JOINT REPLACEMENT      total hip  . LUMBAR FUSION  03/16/2019  . SHOULDER SURGERY Left 2010  . TOE SURGERY Left 2018  . TONSILLECTOMY     as a child  . TOTAL HIP ARTHROPLASTY Left 2011  . UPPER GASTROINTESTINAL ENDOSCOPY  01/04/2020  . VENTRICULOPERITONEAL SHUNT  2007   x2    Family History  Problem Relation Age of Onset  . Other Mother   . Lung cancer Father        alive, former smoker   . Heart disease Brother        MI age 19  . Other Brother        Murdered  . Down syndrome Son   . Diabetes Neg Hx   . Prostate cancer Neg Hx   . Colon cancer Neg Hx   . Stomach cancer Neg Hx   . Pancreatic cancer Neg Hx   . Liver disease Neg Hx     Social History:  reports that he has never smoked. He has never used smokeless tobacco. He reports that he does not drink alcohol and does not use drugs.    Review of Systems     HYPERTENSION: On carvedilol for some time Followed by PCP   BP Readings from Last 3 Encounters:  03/21/20 122/88  03/12/20 130/90  01/15/20 123/86   RENAL dysfunction: Has had variable creatinine levels but persistently mildly abnormal  Lab Results  Component Value Date   CREATININE 1.36 03/19/2020   CREATININE 1.50 12/21/2019   CREATININE 1.32 10/11/2019    Lipid history: He is on Lipitor  20 mg, last LDL over 100 and needs follow-up  Is on fenofibrate for triglyceride treatment, has increased triglycerides with results as below      Lab Results  Component Value Date   CHOL 172 10/09/2019   HDL 30.90 (L) 10/09/2019   LDLCALC 106 (H) 04/28/2019   LDLDIRECT 83.0 10/09/2019   TRIG 283.0 (H) 10/09/2019   CHOLHDL 6 10/09/2019            Most recent foot exam: 08/2017  He has symptomatic painful neuropathy and is on gabapentin 600 mg twice a day and  Cymbalta 60 mg Symptoms are relatively well controlled but he is concerned about numbness in his feet  Has been followed by neurologist    Review of Systems    Physical Examination:  BP 122/88   Pulse 82    Ht 5' 9"  (1.753 m)   Wt 255 lb (115.7 kg)   SpO2 94%   BMI 37.66 kg/m       ASSESSMENT:  Diabetes type 2 with obesity  See history of present illness for discussion of current diabetes management, blood sugar patterns  and problems identified  Currently on a regimen of Ozempic, Metformin and 4 mg Amaryl He has been prescribed Humalog but likely not taking it  A1c is still high at 8.1  He is not taking Humalog as prescribed and does not like to take injections He is also not checking his blood sugar with the libre consistently Blood sugars are progressively higher from morning to night despite taking Amaryl in the morning   PLAN:   He will set a reminder on his phone to take Ozempic regularly every week Increase the dose to 1 mg weekly Consider OmniPod insulin pump and discussed in detail how this works and benefits He will check on the cost of this and paperwork was filled out To start when this is available  Needs to take Humalog consistently at lunch at noon or even if he has to take it with him at restaurants Can start with 10 to 14 units and increase further if postprandial readings are over 200 Cut back on regular soft drinks and high-fat foods   Patient Instructions  Humalog 10-14 at meals daily based on meal size and keep sugar <180 after meals  Set reminder on phone for Ozempic weekly  Take 2 shots of Ozempic 0.59m weekly      AElayne Snare9/23/2021, 3:11 PM

## 2020-03-28 ENCOUNTER — Other Ambulatory Visit: Payer: Self-pay | Admitting: Internal Medicine

## 2020-03-28 ENCOUNTER — Other Ambulatory Visit: Payer: Self-pay | Admitting: Endocrinology

## 2020-03-28 MED FILL — CARVEDILOL 12.5 MG TABLET: 12.5 | 90 days supply | Qty: 180 | Fill #0

## 2020-03-28 MED FILL — METFORMIN HCL 500 MG TABS: 500 | 30 days supply | Qty: 120 | Fill #1

## 2020-03-28 MED FILL — FENOFIBRATE 134 MG CAPSULE: 134 | 35 days supply | Qty: 15 | Fill #0

## 2020-03-28 MED FILL — GLIMEPIRIDE 4 MG TABLET: 4 | 30 days supply | Qty: 30 | Fill #1

## 2020-03-28 MED FILL — ATORVASTATIN CALCIUM 20 MG: 20 | 90 days supply | Qty: 90 | Fill #0

## 2020-04-01 ENCOUNTER — Other Ambulatory Visit (HOSPITAL_BASED_OUTPATIENT_CLINIC_OR_DEPARTMENT_OTHER): Payer: Self-pay | Admitting: Endocrinology

## 2020-04-04 ENCOUNTER — Telehealth: Payer: Self-pay | Admitting: *Deleted

## 2020-04-04 NOTE — Telephone Encounter (Signed)
Faxed/sent complete document to [phone]

## 2020-04-10 ENCOUNTER — Telehealth: Payer: Self-pay

## 2020-04-10 NOTE — Telephone Encounter (Signed)
James [name] is returning James [name]'s call about getting scheduled for an appointment, did advise patient to call back alter this afternoon.

## 2020-04-11 NOTE — Telephone Encounter (Signed)
FYI Angie

## 2020-04-16 ENCOUNTER — Telehealth: Payer: Self-pay | Admitting: Pharmacist

## 2020-04-16 MED FILL — HUMIRA PEN 40 MG/0.8ML PNKT: 40 | 28 days supply | Qty: 2 | Fill #2

## 2020-04-16 NOTE — Telephone Encounter (Signed)
Called patient to schedule an appointment for the [facility]. I was unable to reach the patient so I left a HIPAA-compliant message requesting that the patient return my call.

## 2020-04-17 ENCOUNTER — Ambulatory Visit (INDEPENDENT_AMBULATORY_CARE_PROVIDER_SITE_OTHER): Payer: 59 | Admitting: Neurology

## 2020-04-17 ENCOUNTER — Encounter: Payer: Self-pay | Admitting: Pharmacist

## 2020-04-17 ENCOUNTER — Encounter: Payer: Self-pay | Admitting: Neurology

## 2020-04-17 ENCOUNTER — Other Ambulatory Visit: Payer: Self-pay | Admitting: Neurology

## 2020-04-17 ENCOUNTER — Other Ambulatory Visit: Payer: Self-pay

## 2020-04-17 ENCOUNTER — Ambulatory Visit (HOSPITAL_BASED_OUTPATIENT_CLINIC_OR_DEPARTMENT_OTHER): Payer: 59 | Admitting: Pharmacist

## 2020-04-17 VITALS — BP 158/95 | HR 75 | Ht 69.0 in | Wt 251.5 lb

## 2020-04-17 DIAGNOSIS — G4700 Insomnia, unspecified: Secondary | ICD-10-CM | POA: Diagnosis not present

## 2020-04-17 DIAGNOSIS — G4733 Obstructive sleep apnea (adult) (pediatric): Secondary | ICD-10-CM

## 2020-04-17 DIAGNOSIS — G43709 Chronic migraine without aura, not intractable, without status migrainosus: Secondary | ICD-10-CM

## 2020-04-17 DIAGNOSIS — Z982 Presence of cerebrospinal fluid drainage device: Secondary | ICD-10-CM

## 2020-04-17 DIAGNOSIS — Z79899 Other long term (current) drug therapy: Secondary | ICD-10-CM

## 2020-04-17 MED ORDER — TRAZODONE HCL 50 MG PO TABS: 50.0000 mg | ORAL_TABLET | Freq: Every day | ORAL | 3 refills | Status: DC

## 2020-04-17 MED FILL — traZODone HCL 50 MG TABS: 50 | 90 days supply | Qty: 90 | Fill #0

## 2020-04-17 NOTE — Progress Notes (Signed)
[facility]  PATIENT: James [name] DOB: [birth year]  REFERRING DOCTOR OR PCP:  Kathlene November  SOURCE: patient, notes from Dr. [name], imaging results and MRI and CT scans on PACS personally reviewed  _________________________________   HISTORICAL  CHIEF COMPLAINT:  Chief Complaint  Patient presents with  . Botulinum Toxin Injection    RM 12, alone. Here for botox for migraines. Lot: [id number]. Exp: 09/2022. NDC: 9826-4158-30. 200Ux 1 vial. A couple weeks ago, he started getting headaches again. Getting them daily.     HISTORY OF PRESENT ILLNESS:  James [name] is a 64 y.o. man with idiopathic intracranial hypertension and recent increase in the frequency and severity of headache  Update 03/30/2020:  He was experiencing headaches 29 to 30 days every month.  Botox was started at the last visit and he did better x 2+ months.   HA's are just starting to return now.   He still had left neck pain for 3 weeks after the injections.     Sometimes he wakes up with the headaches and sometimes they develop as the day goes on.  They are associated with nausea at times.  When the headaches more severe he has photophobia and phonophobia.  Headaches generally improved when he is laying down and worsens with movements.    He did not get a benefit from multiple prophylactic medications including Keppra, gabapentin, Emgality, Cymbalta.  He has not tried Botox therapy before today.  He has OSA.  He was unable to tolerate CPAP.  We did discuss other possible treatments but he is not interested at this time.   He has insomnia.  Tylenol PM has not helped.  He has had some RBD with acticve dreams   EPWORTH SLEEPINESS SCALE  On a scale of 0 - 3 what is the chance of dozing:  Sitting and Reading:     0 Watching TV:   3 Sitting inactive in a public place: 3 Passenger in car for one hour: 3 Lying down to rest in the afternoon: 3 Sitting and talking to someone: 0 Sitting quietly after  lunch:  3 In a car, stopped in traffic:  0  Total (out of 24):  15/24  Chronic Migraine History: He was having daily HA with migrainous features.   He did not get a benefit from multiple prophylactic medications including Keppra, gabapentin, Emgality, Cymbalta.   Botox therapy started July 2021. Spine: L-spine MRI 08/15/18 showed progressive epidural lipomatosis in the lower lumbar spine with progressive compression of the thecal sac and spinal stenosis at L3-4, L4-5, and L5-S1 compared to the prior study. Progressive facet degeneration and anterolisthesis at L4-5 since 2011.   MRI of the cervical spine performed 04/27/2017 showed  thoracic fusion hardware from C7 and below. There is mild spondylosis and disc bulging at C3-C4 through C6-C7. There does not appear to be any significant foraminal narrowing and there is no spinal stenosis.   Idiopthic intracranial Hypertension: He was diagnosed with idiopathic intracranial hypertension many years ago and had a VP shunt placed in 2007. A revision was performed July 2007 due to infection. He has a programmable Medtronic valve. Due to the increased headaches, it was reprogrammed from 1.5-1.0. Tapping of the shunt showed a pressure 160 (was drained to 140 mm). There was no infection. He also had an MRI of the brain and MR venogram. CT had shown the left-sided ventricular catheter extends into the right caudate head, similar to the previous study the MR venogram (11/12/2016)  was reportedly normal. The left transverse and sigmoid sinuses are not well evaluated due to the shunt reservoir (but also likely he is right dominant).   CT head 3/13 2020 personally reviewed and no evidence of shunt failure        REVIEW OF SYSTEMS: Constitutional: No fevers, chills, sweats, or change in appetite Eyes: No visual changes, double vision, eye pain Ear, nose and throat: No hearing loss, ear pain, nasal congestion, sore throat Cardiovascular: No chest pain,  palpitations Respiratory: No shortness of breath at rest or with exertion.   No wheezes GastrointestinaI: No nausea, vomiting, diarrhea, abdominal pain, fecal incontinence Genitourinary: No dysuria, urinary retention or frequency.  No nocturia. Musculoskeletal: as above Integumentary: psoriasis on Humira Neurological: as above Psychiatric: No depression at this time.  No anxiety Endocrine: No palpitations, diaphoresis, change in appetite, change in weigh or increased thirst Hematologic/Lymphatic: No anemia, purpura, petechiae. Allergic/Immunologic: No itchy/runny eyes, nasal congestion, recent allergic reactions, rashes  ALLERGIES: Allergies  Allergen Reactions  . Bee Venom Anaphylaxis  . Hydrocodone-Homatropine Other (See Comments)    Hallucinations, confusion, delirium Depressed feeling  . Toradol [Ketorolac Tromethamine] Other (See Comments)    Hallucinations, confusion, delirium  . Morphine And Related Other (See Comments)    Hallucinations, back in the 80s. States has taken vicodin before w/o problems   . Sulfa Drugs Cross Reactors Rash    HOME MEDICATIONS:  Current Outpatient Medications:  .  Adalimumab (HUMIRA PEN) 40 MG/0.8ML PNKT, Inject 40 mg into the skin every 14 (fourteen) days., Disp: 2 each, Rfl: 5 .  atorvastatin (LIPITOR) 20 MG tablet, Take 1 tablet (20 mg total) by mouth at bedtime., Disp: 90 tablet, Rfl: 1 .  Blood Glucose Monitoring Suppl (FREESTYLE FREEDOM LITE) w/Device KIT, Use to check blood sugars 3 times daily. Dx code: E11.9, Disp: 1 each, Rfl: 1 .  carvedilol (COREG) 12.5 MG tablet, Take 1 tablet (12.5 mg total) by mouth 2 (two) times daily with a meal., Disp: 180 tablet, Rfl: 1 .  Continuous Blood Gluc Sensor (FREESTYLE LIBRE 14 DAY SENSOR) MISC, USE AS DIRECTED TO CHECK BLOOD SUGAR, Disp: 2 each, Rfl: 3 .  DULoxetine (CYMBALTA) 60 MG capsule, Take 2 capsules (120 mg total) by mouth daily., Disp: 180 capsule, Rfl: 1 .  EPINEPHrine (EPIPEN 2-PAK) 0.3  mg/0.3 mL IJ SOAJ injection, Inject 0.3 mLs (0.3 mg total) into the muscle once as needed for up to 1 dose for anaphylaxis., Disp: 2 each, Rfl: 2 .  fenofibrate micronized (LOFIBRA) 134 MG capsule, TAKE 1 CAPSULE BY MOUTH THREE TIMES WEEKLY, Disp: 15 capsule, Rfl: 0 .  Ferrous Sulfate (IRON) 325 (65 Fe) MG TABS, Take 1 tablet (325 mg total) by mouth 2 (two) times daily before a meal., Disp: 60 tablet, Rfl: 6 .  gabapentin (NEURONTIN) 600 MG tablet, Take 1 tablet (600 mg total) by mouth 2 (two) times daily., Disp: 180 tablet, Rfl: 1 .  glimepiride (AMARYL) 4 MG tablet, TAKE 1 TABLET BY MOUTH ONCE DAILY BEFORE BREAKFAST, Disp: 30 tablet, Rfl: 3 .  glucose blood test strip, Use Freestyle Freedom Lite test strips as instructed to check blood sugar 4 times daily., Disp: 20 each, Rfl: 2 .  insulin lispro (HUMALOG KWIKPEN) 100 UNIT/ML KwikPen, 4-8 units before each meal, Disp: 15 mL, Rfl: 1 .  Lancets (FREESTYLE) lancets, Use to check blood sugar 3 times daily. Dx code E11.9, Disp: 100 each, Rfl: 12 .  levETIRAcetam (KEPPRA) 750 MG tablet, Take 1 tablet (750 mg total)  by mouth 2 (two) times daily., Disp: 60 tablet, Rfl: 0 .  metFORMIN (GLUCOPHAGE) 500 MG tablet, TAKE 2 TABLETS BY MOUTH TWICE DAILY., Disp: 120 tablet, Rfl: 3 .  omeprazole (PRILOSEC) 40 MG capsule, Take 1 capsule (40 mg total) by mouth 2 (two) times daily before a meal., Disp: 180 capsule, Rfl: 3 .  Semaglutide, 1 MG/DOSE, (OZEMPIC, 1 MG/DOSE,) 2 MG/1.5ML SOPN, Inject 1 mg into the skin once a week., Disp: 1.5 mL, Rfl: 2 .  traZODone (DESYREL) 50 MG tablet, Take 1 tablet (50 mg total) by mouth at bedtime., Disp: 90 tablet, Rfl: 3  PAST MEDICAL HISTORY: Past Medical History:  Diagnosis Date  . Acid reflux   . Arthritis   . Blood transfusion without reported diagnosis   . Chronic headaches    on cymbalta  . Cirrhosis ([place])   . Colon polyps   . Complication of anesthesia    problems waking up from anesthesia  . Depression    on  cymbalta  . Diabetes mellitus with neuropathy ([place])   . Diverticulitis 03/2013  . Eczema   . Elevated LFTs   . Fatty liver   . Hepatitis 10/2017   NASH cirrhosis  . History of kidney stones   . Hyperlipidemia   . Hypertension   . Insomnia 04/26/2013  . Kidney stone   . Morbid obesity ([place])   . Neuromuscular disorder ([facility])    neuropathy  . Neuropathy   . Post-traumatic hydrocephalus ([facility])    s/p shunts x 2 (first got infected )  . Psoriasis    sees Dr [name]  . Psoriatic arthritis ([place])   . 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 Sleep apnea    no CPAP     . Stomach ulcer   . Testosterone deficiency 04/28/2011    PAST SURGICAL HISTORY: Past Surgical History:  Procedure Laterality Date  . BACK SURGERY  1980  . BRAIN SURGERY     VP shunts placed in 2007  . CHOLECYSTECTOMY N/A 08/25/2017   Procedure: LAPAROSCOPIC CHOLECYSTECTOMY WITH INTRAOPERATIVE CHOLANGIOGRAM;  Surgeon: [name], MD;  Location: [facility];  Service: General;  Laterality: N/A;  . COLONOSCOPY    . JOINT REPLACEMENT     total hip  . LUMBAR FUSION  03/16/2019  . SHOULDER SURGERY Left 2010  . TOE SURGERY Left 2018  . TONSILLECTOMY     as a child  . TOTAL HIP ARTHROPLASTY Left 2011  . UPPER GASTROINTESTINAL ENDOSCOPY  01/04/2020  . VENTRICULOPERITONEAL SHUNT  2007   x2    FAMILY HISTORY: Family History  Problem Relation Age of Onset  . Other Mother   . Lung cancer Father        alive, former smoker   . Heart disease Brother        MI age 74  . Other Brother        Murdered  . Down syndrome Son   . Diabetes Neg Hx   . Prostate cancer Neg Hx   . Colon cancer Neg Hx   . Stomach cancer Neg Hx   . Pancreatic cancer Neg Hx   . Liver disease Neg Hx     SOCIAL HISTORY:  Social History   Socioeconomic History  . Marital status: Married    Spouse name: [name]  . Number of children: 2  . Years of education: Not on file  . Highest education level: Not on  file  Occupational History  .  Occupation: disabled   Tobacco Use  . Smoking status: Never Smoker  . Smokeless tobacco: Never Used  Vaping Use  . Vaping Use: Never used  Substance and Sexual Activity  . Alcohol use: No  . Drug use: No  . Sexual activity: Yes    Partners: Female  Other Topics Concern  . 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   Social Determinants of Health   Financial Resource Strain:   . Difficulty of Paying Living Expenses: Not on file  Food Insecurity:   . Worried About Charity fundraiser in the Last Year: Not on file  . Ran Out of Food in the Last Year: Not on file  Transportation Needs:   . Lack of Transportation (Medical): Not on file  . Lack of Transportation (Non-Medical): Not on file  Physical Activity:   . Days of Exercise per Week: Not on file  . Minutes of Exercise per Session: Not on file  Stress:   . Feeling of Stress : Not on file  Social Connections:   . Frequency of Communication with Friends and Family: Not on file  . Frequency of Social Gatherings with Friends and Family: Not on file  . Attends Religious Services: Not on file  . Active Member of Clubs or Organizations: Not on file  . Attends Archivist Meetings: Not on file  . Marital Status: Not on file  Intimate Partner Violence:   . Fear of Current or Ex-Partner: Not on file  . Emotionally Abused: Not on file  . Physically Abused: Not on file  . Sexually Abused: Not on file     PHYSICAL EXAM  Vitals:   04/17/20 1020  BP: (!) 158/95  Pulse: 75  Weight: 251 lb 8 oz (114.1 kg)  Height: _0  (1.753 m)    Body mass index is 37.14 kg/m.   General: The patient is well-developed and well-nourished and in no acute distress,     Neck: He hasmild tenderness at the occiput bilaterally.    Skin:  Extremities are without significant edema.  Neurologic Exam  Mental status: The patient is alert and oriented x 3 at the time of the examination. The patient has apparent normal recent and remote memory, with an apparently normal attention span and concentration ability.   Speech is normal.  Cranial nerves: Extraocular movements are full.  Facial strength and sensation is normal. OK neck strength  Motor:  Muscle bulk is normal.   Tone is normal. Strength is  5 / 5 in the arms and proximal legs and 4+/5 in the intrinsic foot muscles in both feet.   Coordination: Cerebellar testing shows good finger-nose-finger  Gait and station: Station is normal.   Arthritic gait. . Tandem walk is wide... Romberg is negative.   Reflexes: Deep tendon reflexes are 1 and symmetric in the arms, 2 and symmetric at the knees and absent at the ankles.Marland Kitchen       DIAGNOSTIC DATA (LABS, IMAGING, TESTING) - I reviewed patient records, labs, notes, testing and imaging myself where available.  Lab Results  Component Value Date   WBC 6.1 03/12/2020   HGB 14.4 03/12/2020   HCT 43.0 03/12/2020   MCV 89.6 03/12/2020   PLT 159.0 03/12/2020      Component Value Date/Time  NA 133 (L) 03/19/2020 1130   NA 139 02/24/2019 0000   K 4.3 03/19/2020 1130   CL 100 03/19/2020 1130   CO2 24 03/19/2020 1130   GLUCOSE 310 (H) 03/19/2020 1130   BUN 21 03/19/2020 1130   BUN 21 02/24/2019 0000   CREATININE 1.36 03/19/2020 1130   CALCIUM 9.5 03/19/2020 1130   PROT 7.2 10/09/2019 1005   ALBUMIN 4.1 10/09/2019 1005   AST 24 10/09/2019 1005   ALT 24 10/09/2019 1005   ALKPHOS 100 10/09/2019 1005   BILITOT 0.7 10/09/2019 1005   GFRNONAA 47 (L) 03/19/2019 1303   GFRAA 55 (L) 03/19/2019 1303   Lab Results  Component Value Date   CHOL 172 10/09/2019   HDL 30.90 (L) 10/09/2019   LDLCALC 106 (H) 04/28/2019   LDLDIRECT 83.0 10/09/2019   TRIG 283.0 (H) 10/09/2019   CHOLHDL 6 10/09/2019   Lab Results  Component Value Date    HGBA1C 8.1 (H) 03/19/2020   Lab Results  Component Value Date   [id number] 594 06/10/2018   Lab Results  Component Value Date   TSH 2.34 06/10/2018       ASSESSMENT AND PLAN  Chronic migraine w/o aura, not intractable, w/o stat migr  Presence of cerebrospinal fluid drainage device  OSA -- dx ~ 2012, cpap intolerant  Insomnia, unspecified type  1.  Botox 180 units as follows: Frontalis muscle (5 units x 4), corrugators and procerus (5 units x 3), temporalis (5 units x 8), occipitalis (5 units x 4), splenius capitis (12.5 units x 2), splenius cervicis (12.5 units x 2), C6-C7 paraspinal (5 units x 2), trapezius (12.5 units x 2)  2.   Strongly advised to wear CPAP or consider surgery. 3.   Trazodone for insomnia 4.   rtc 6 months, sooner if new or worsening issues.  Jane Broughton A. Felecia Shelling, MD, [facility] 70/76/1518, 34:37 AM Certified in Neurology, Clinical Neurophysiology, Sleep Medicine, Pain Medicine and Neuroimaging  [facility] [address] [place], [place] [address] [phone]

## 2020-04-17 NOTE — Progress Notes (Signed)
S: Patient has a follow up today for his specialty medication, Humira.   Patient is currently taking Humira for psoriatic arthritis. Patient is managed by Dr. Renda Rolls for this.   Adherence: denies any missed doses   Efficacy: reports that it is still working well for him.  Dosing: SubQ: 40 mg every other week  Drug-drug interactions:none  Screening: TB test: negative per patient  Hepatitis: completed prior to drug initiation   Monitoring: S/sx of infection: none currently CBC: last CBC (see below) S/sx of hypersensitivity: none S/sx of malignancy: none S/sx of heart failure: no diagnosis of HF    O:     Lab Results  Component Value Date   WBC 6.1 03/12/2020   HGB 14.4 03/12/2020   HCT 43.0 03/12/2020   MCV 89.6 03/12/2020   PLT 159.0 03/12/2020      Chemistry      Component Value Date/Time   NA 133 (L) 03/19/2020 1130   NA 139 02/24/2019 0000   K 4.3 03/19/2020 1130   CL 100 03/19/2020 1130   CO2 24 03/19/2020 1130   BUN 21 03/19/2020 1130   BUN 21 02/24/2019 0000   CREATININE 1.36 03/19/2020 1130   GLU 189 02/24/2019 0000      Component Value Date/Time   CALCIUM 9.5 03/19/2020 1130   ALKPHOS 100 10/09/2019 1005   AST 24 10/09/2019 1005   ALT 24 10/09/2019 1005   BILITOT 0.7 10/09/2019 1005       A/P: 1. Medication review: Patient on Humira for psoriatic arthritis and is tolerating it well with no adverse effects and reported control of psoriatic arthritis. Patient has been stable on medication for a while now. Patient denies any questions/concerns. No recommendations for any changes.   Benard Halsted, PharmD, [place] [phone]

## 2020-04-25 ENCOUNTER — Other Ambulatory Visit (INDEPENDENT_AMBULATORY_CARE_PROVIDER_SITE_OTHER): Payer: 59

## 2020-04-25 ENCOUNTER — Other Ambulatory Visit: Payer: Self-pay

## 2020-04-25 ENCOUNTER — Other Ambulatory Visit: Payer: Self-pay | Admitting: Endocrinology

## 2020-04-25 DIAGNOSIS — IMO0002 Reserved for concepts with insufficient information to code with codable children: Secondary | ICD-10-CM

## 2020-04-25 DIAGNOSIS — E1142 Type 2 diabetes mellitus with diabetic polyneuropathy: Secondary | ICD-10-CM

## 2020-04-25 DIAGNOSIS — E1165 Type 2 diabetes mellitus with hyperglycemia: Secondary | ICD-10-CM | POA: Diagnosis not present

## 2020-04-25 LAB — BASIC METABOLIC PANEL
BUN: 18 mg/dL (ref 6–23)
CO2: 26 mEq/L (ref 19–32)
Calcium: 9.6 mg/dL (ref 8.4–10.5)
Chloride: 103 mEq/L (ref 96–112)
Creatinine, Ser: 1.46 mg/dL (ref 0.40–1.50)
GFR: 51.35 mL/min — ABNORMAL LOW (ref >60.00)
Glucose, Bld: 194 mg/dL — ABNORMAL HIGH (ref 70–99)
Potassium: 4.1 mEq/L (ref 3.5–5.1)
Sodium: 136 mEq/L (ref 135–145)

## 2020-04-26 LAB — FRUCTOSAMINE: Fructosamine: 341 umol/L — ABNORMAL HIGH (ref 0–285)

## 2020-04-29 ENCOUNTER — Other Ambulatory Visit: Payer: Self-pay

## 2020-04-29 ENCOUNTER — Other Ambulatory Visit: Payer: Self-pay | Admitting: *Deleted

## 2020-04-29 MED ORDER — HUMALOG 100 UNIT/ML ~~LOC~~ SOCT: SUBCUTANEOUS | 1 refills | Status: DC

## 2020-04-29 NOTE — Progress Notes (Signed)
error 

## 2020-04-29 NOTE — Progress Notes (Signed)
Patient ID: James [name], male   DOB: Aug 06, 1955, 64 y.o.   MRN: [id number]           Reason for Appointment: Follow-up for Type 2 Diabetes   History of Present Illness:          Date of diagnosis of type 2 diabetes mellitus:  ?  2014      Background history:  He is not clear how his diabetes was diagnosed, likely on routine testing. Initially had been treated with metformin and also tried on Amaryl Patient had progressive increase in his blood sugars since 1/17 with stopping metformin and being on the regimen of Amaryl and Januvia, he thinks his blood sugars went up to 601.  He was then started on basal bolus insulin   Lowest A1c was 6.8 previously  Recent history:    Non-insulin hypoglycemic drugs the patient is taking are:  Amaryl 77m , metformin 1000 mg twice daily, Ozempic 0.5 mg weekly  OmniPod insulin pump settings as follows Basal rates: 12 AM-6 AM = 0.1, 6 AM-11 AM = 0.5, 11 AM-12 AM = 1.0 Correction 1: 50 carbohydrate coverage 1: 1  His A1c is 8.1 on his last visit About the same   Current management, blood sugar patterns and problems identified:   He was advised to start the insulin pump because of poor compliance with his mealtime insulin  Most of his hyperglycemia is postprandial  Although he started his insulin pump yesterday afternoon he did not take a bolus at dinnertime as glucose was 109  However previously after lunch his blood sugars were over 200 with eating a fast food sandwich  Surprisingly even with his evening meal his blood sugar did not go up and only went up late at night after eating a sweet snack  FASTING glucose was 109 today but by about 10:00 his blood sugar has gone up to 172 without any food or drink  He is not comfortable with changing his basal rate settings and he is usually relying on his wife to help him with adjustments  However has only started using the freestyle libre in the last 5 days with inconsistent monitoring: This  information indicates that his blood sugars are averaging over 200 midday and sometimes late at night   Side  effects from medications have been: None  Compliance with the medical regimen: Fair  Glucose monitoring:  done up to 5 times a day         Glucometer:  Freestyle libre   Previous data:   CGM use % of time  51  Average and SD   Time in range       53%  % Time Above 180  47  % Time above 250   % Time Below target 0    PRE-MEAL Fasting Lunch Dinner Bedtime Overall  Glucose range:       Mean/median:  137  188  210  157    POST-MEAL PC Breakfast PC Lunch PC Dinner  Glucose range:     Mean/median:   212  232     Self-care:   Meal times are:  Breakfast variable, lunch: 12-3 PM Dinner: 6 PM    Dietician visit, most recent:09/2015               CDE consultation: 12/2018  Weight history:  Wt Readings from Last 3 Encounters:  04/30/20 250 lb 3.2 oz (113.5 kg)  04/17/20 251 lb 8 oz (114.1 kg)  03/21/20  255 lb (115.7 kg)    Glycemic control:   Lab Results  Component Value Date   HGBA1C 8.1 (H) 03/19/2020   HGBA1C 8.3 (H) 10/11/2019   HGBA1C 7.6 (H) 04/28/2019   Lab Results  Component Value Date   MICROALBUR 0.9 03/19/2020   LDLCALC 106 (H) 04/28/2019   CREATININE 1.46 04/25/2020    Lab on 04/25/2020  Component Date Value Ref Range Status   Sodium 04/25/2020 136  135 - 145 mEq/L Final   Potassium 04/25/2020 4.1  3.5 - 5.1 mEq/L Final   Chloride 04/25/2020 103  96 - 112 mEq/L Final   CO2 04/25/2020 26  19 - 32 mEq/L Final   Glucose, Bld 04/25/2020 194* 70 - 99 mg/dL Final   BUN 04/25/2020 18  6 - 23 mg/dL Final   Creatinine, Ser 04/25/2020 1.46  0.40 - 1.50 mg/dL Final   GFR 04/25/2020 51.35* >60.00 mL/min Final   Calculated using the CKD-EPI Creatinine Equation (2021)   Calcium 04/25/2020 9.6  8.4 - 10.5 mg/dL Final   Fructosamine 04/25/2020 341* 0 - 285 umol/L Final   Comment: Published reference interval for apparently healthy  subjects between age 11 and 46 is 86 - 285 umol/L and in a poorly controlled diabetic population is 228 - 563 umol/L with a mean of 396 umol/L.        Allergies as of 04/30/2020      Reactions   Bee Venom Anaphylaxis   Hydrocodone-homatropine Other (See Comments)   Hallucinations, confusion, delirium Depressed feeling   Toradol [ketorolac Tromethamine] Other (See Comments)   Hallucinations, confusion, delirium   Morphine And Related Other (See Comments)   Hallucinations, back in the 80s. States has taken vicodin before w/o problems    Sulfa Drugs Cross Reactors Rash      Medication List       Accurate as of April 30, 2020 12:44 PM. If you have any questions, ask your nurse or doctor.        atorvastatin 20 MG tablet Commonly known as: LIPITOR Take 1 tablet (20 mg total) by mouth at bedtime.   carvedilol 12.5 MG tablet Commonly known as: COREG Take 1 tablet (12.5 mg total) by mouth 2 (two) times daily with a meal.   DULoxetine 60 MG capsule Commonly known as: CYMBALTA Take 2 capsules (120 mg total) by mouth daily.   EPINEPHrine 0.3 mg/0.3 mL Soaj injection Commonly known as: EpiPen 2-Pak Inject 0.3 mLs (0.3 mg total) into the muscle once as needed for up to 1 dose for anaphylaxis.   fenofibrate micronized 134 MG capsule Commonly known as: LOFIBRA TAKE 1 CAPSULE BY MOUTH THREE TIMES WEEKLY   FreeStyle Freedom Lite w/Device Kit Use to check blood sugars 3 times daily. Dx code: E11.9   freestyle lancets Use to check blood sugar 3 times daily. Dx code E11.9   FreeStyle Libre 14 Day Sensor Misc USE AS DIRECTED TO CHECK BLOOD SUGAR   gabapentin 600 MG tablet Commonly known as: NEURONTIN Take 1 tablet (600 mg total) by mouth 2 (two) times daily.   glimepiride 4 MG tablet Commonly known as: AMARYL TAKE 1 TABLET BY MOUTH ONCE DAILY BEFORE BREAKFAST   glucose blood test strip Use Freestyle Freedom Lite test strips as instructed to check blood sugar 4 times  daily.   HumaLOG 100 UNIT/ML cartridge Generic drug: insulin lispro Use max 50 units per day in your pump   Humira Pen 40 MG/0.8ML Pnkt Generic drug: Adalimumab Inject 40 mg into  the skin every 14 (fourteen) days.   Iron 325 (65 Fe) MG Tabs Take 1 tablet (325 mg total) by mouth 2 (two) times daily before a meal.   levETIRAcetam 750 MG tablet Commonly known as: KEPPRA Take 1 tablet (750 mg total) by mouth 2 (two) times daily.   metFORMIN 500 MG tablet Commonly known as: GLUCOPHAGE TAKE 2 TABLETS BY MOUTH TWICE DAILY.   omeprazole 40 MG capsule Commonly known as: PRILOSEC Take 1 capsule (40 mg total) by mouth 2 (two) times daily before a meal.   Ozempic (1 MG/DOSE) 2 MG/1.5ML Sopn Generic drug: Semaglutide (1 MG/DOSE) Inject 1 mg into the skin once a week.   traZODone 50 MG tablet Commonly known as: DESYREL Take 1 tablet (50 mg total) by mouth at bedtime.       Allergies:  Allergies  Allergen Reactions   Bee Venom Anaphylaxis   Hydrocodone-Homatropine Other (See Comments)    Hallucinations, confusion, delirium Depressed feeling   Toradol [Ketorolac Tromethamine] Other (See Comments)    Hallucinations, confusion, delirium   Morphine And Related Other (See Comments)    Hallucinations, back in the 80s. States has taken vicodin before w/o problems    Sulfa Drugs Cross Reactors Rash    Past Medical History:  Diagnosis Date   Acid reflux    Arthritis    Blood transfusion without reported diagnosis    Chronic headaches    on cymbalta   Cirrhosis ([place])    Colon polyps    Complication of anesthesia    problems waking up from anesthesia   Depression    on cymbalta   Diabetes mellitus with neuropathy ([place])    Diverticulitis 03/2013   Eczema    Elevated LFTs    Fatty liver    Hepatitis 10/2017   NASH cirrhosis   History of kidney stones    Hyperlipidemia    Hypertension    Insomnia 04/26/2013   Kidney stone    Morbid obesity ([place])     Neuromuscular disorder ([place])    neuropathy   Neuropathy    Post-traumatic hydrocephalus ([place])    s/p shunts x 2 (first got infected )   Psoriasis    sees Dr [name]   Psoriatic arthritis [facility])    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   see's Dr.Ortmann   Sleep apnea    no CPAP      Stomach ulcer    Testosterone deficiency 04/28/2011    Past Surgical History:  Procedure Laterality Date   [place]     VP shunts placed in 2007   CHOLECYSTECTOMY N/A 08/25/2017   Procedure: [place] CHOLANGIOGRAM;  Surgeon: [name], MD;  Location: [facility];  Service: General;  Laterality: N/A;   COLONOSCOPY     JOINT REPLACEMENT     total hip   LUMBAR FUSION  03/16/2019   SHOULDER SURGERY Left 2010   TOE SURGERY Left 2018   TONSILLECTOMY     as a child   TOTAL HIP ARTHROPLASTY Left 2011   UPPER GASTROINTESTINAL ENDOSCOPY  01/04/2020   VENTRICULOPERITONEAL SHUNT  2007   x2    Family History  Problem Relation Age of Onset   Other Mother    Lung cancer Father        alive, former smoker    Heart disease Brother        MI age 7   Other Brother  Murdered   Down syndrome Son    Diabetes Neg Hx    Prostate cancer Neg Hx    Colon cancer Neg Hx    Stomach cancer Neg Hx    Pancreatic cancer Neg Hx    Liver disease Neg Hx     Social History:  reports that he has never smoked. He has never used smokeless tobacco. He reports that he does not drink alcohol and does not use drugs.    Review of Systems     HYPERTENSION: On carvedilol for treatment Hypertension managed by his PCP   BP Readings from Last 3 Encounters:  04/30/20 130/90  04/17/20 (!) 158/95  03/21/20 122/88   RENAL dysfunction: Has had variable creatinine levels but persistently mildly abnormal  Lab Results  Component Value Date   CREATININE 1.46 04/25/2020   CREATININE 1.36 03/19/2020    CREATININE 1.50 12/21/2019    Lipid history: He is on Lipitor  20 mg, last LDL over 100 and needs follow-up  Is on fenofibrate for triglyceride treatment, has increased triglycerides with results as below      Lab Results  Component Value Date   CHOL 172 10/09/2019   HDL 30.90 (L) 10/09/2019   LDLCALC 106 (H) 04/28/2019   LDLDIRECT 83.0 10/09/2019   TRIG 283.0 (H) 10/09/2019   CHOLHDL 6 10/09/2019           He is being followed by gastroenterology for liver cirrhosis secondary to [facility]  Lab Results  Component Value Date   ALT 24 10/09/2019     Most recent foot exam: 08/2017  He has symptomatic painful neuropathy and is on gabapentin 600 mg twice a day and  Cymbalta 60 mg Symptoms are relatively well controlled but he is concerned about numbness in his feet  Has been followed by neurologist    Review of Systems    Physical Examination:  BP 130/90    Pulse 78    Ht _0  (1.753 m)    Wt 250 lb 3.2 oz (113.5 kg)    SpO2 (!) 89%    BMI 36.95 kg/m       ASSESSMENT:  Diabetes type 2 with obesity  See history of present illness for discussion of current diabetes management, blood sugar patterns and problems identified  Currently on a regimen of Ozempic, Metformin and 4 mg Amaryl He has been started on the Omni pod insulin pump yesterday  A1c is last high at 8.1  In the last day he has really not used his pump much with no boluses except when his blood sugar was high after lunch yesterday Although his fasting reading was fairly good this morning he appears to have a dawn phenomenon   PLAN:   He will use a basal rate of 1.0 between 8 AM and 12 midnight but continue with the other basal rates. He needs to start bolusing for all the meals even when blood sugars are normal Emphasized the need to avoid high-fat foods and sweets Given information about the linking his pump to the [facility] website Follow-up tomorrow for further adjustments   There are no Patient  Instructions on file for this visit.     Elayne Snare 04/30/2020, 12:44 PM

## 2020-04-30 ENCOUNTER — Encounter: Payer: Self-pay | Admitting: Endocrinology

## 2020-04-30 ENCOUNTER — Other Ambulatory Visit: Payer: Self-pay

## 2020-04-30 ENCOUNTER — Telehealth: Payer: Self-pay | Admitting: Endocrinology

## 2020-04-30 ENCOUNTER — Ambulatory Visit (INDEPENDENT_AMBULATORY_CARE_PROVIDER_SITE_OTHER): Payer: 59 | Admitting: Endocrinology

## 2020-04-30 VITALS — BP 130/90 | HR 78 | Ht 69.0 in | Wt 250.2 lb

## 2020-04-30 DIAGNOSIS — Z794 Long term (current) use of insulin: Secondary | ICD-10-CM

## 2020-04-30 DIAGNOSIS — E1165 Type 2 diabetes mellitus with hyperglycemia: Secondary | ICD-10-CM | POA: Diagnosis not present

## 2020-04-30 NOTE — Telephone Encounter (Signed)
[facility] called - they need clarification on the Humalog RX that was sent to them

## 2020-05-01 ENCOUNTER — Ambulatory Visit (INDEPENDENT_AMBULATORY_CARE_PROVIDER_SITE_OTHER): Payer: 59 | Admitting: Endocrinology

## 2020-05-01 DIAGNOSIS — E1165 Type 2 diabetes mellitus with hyperglycemia: Secondary | ICD-10-CM | POA: Diagnosis not present

## 2020-05-01 DIAGNOSIS — Z794 Long term (current) use of insulin: Secondary | ICD-10-CM

## 2020-05-01 NOTE — Progress Notes (Signed)
Patient ID: James [name], male   DOB: [birth year], 64 y.o.   MRN: [id number]           Reason for Appointment: Follow-up for Type 2 Diabetes   History of Present Illness:          Date of diagnosis of type 2 diabetes mellitus:  ?  2014      Background history:  He is not clear how his diabetes was diagnosed, likely on routine testing. Initially had been treated with metformin and also tried on Amaryl Patient had progressive increase in his blood sugars since 1/17 with stopping metformin and being on the regimen of Amaryl and Januvia, he thinks his blood sugars went up to 601.  He was then started on basal bolus insulin   Lowest A1c was 6.8 previously  Recent history:    Non-insulin hypoglycemic drugs the patient is taking are:  Amaryl 76m , metformin 1000 mg twice daily, Ozempic 0.5 mg weekly  OMNIPOD insulin pump settings as follows Basal rates: 12 AM-6 AM = 0.1, 6 AM-11 AM = 0.5, 11 AM-12 AM = 1.0 Correction 1: 50 carbohydrate coverage 1: 1  His A1c is 8.1 on his last visit, unchanged  Current management, blood sugar patterns and problems identified:   He was able to do his boluses for his meals yesterday  Blood sugars had gone up on Monday evening from a snack but came down to FASTING of 109 yesterday and 115 today  His blood sugars appear to be going up early in the morning after about 8 AM and up to 212 midmorning although not clear if he had any food or drink for this  With a bolus of 12 units both at lunch and dinner his blood sugar stayed relatively level postprandially  However again his blood sugar started going up last evening after about 9:30 PM and was just over 200 on his sensor  He has had relatively lower 5 meals since yesterday and today with a salad his blood sugar was up to 183 postprandially with a 3 unit bolus  No hypoglycemia  He still not able to connect his pump for the online data upload   Side  effects from medications have been:  None  Compliance with the medical regimen: Fair  Glucose monitoring:  done up to 5 times a day         Glucometer:  [employer]  Previous data:   CGM use % of time  51  Average and SD   Time in range       53%  % Time Above 180  47  % Time above 250   % Time Below target 0    PRE-MEAL Fasting Lunch Dinner Bedtime Overall  Glucose range:       Mean/median:  137  188  210  157    POST-MEAL PC Breakfast PC Lunch PC Dinner  Glucose range:     Mean/median:   212  232     Self-care:   Meal times are:  Breakfast variable, lunch: 12-3 PM Dinner: 6 PM    Dietician visit, most recent:09/2015               CDE consultation: 12/2018  Weight history:  Wt Readings from Last 3 Encounters:  04/30/20 250 lb 3.2 oz (113.5 kg)  04/17/20 251 lb 8 oz (114.1 kg)  03/21/20 255 lb (115.7 kg)    Glycemic control:   Lab Results  Component Value  Date   HGBA1C 8.1 (H) 03/19/2020   HGBA1C 8.3 (H) 10/11/2019   HGBA1C 7.6 (H) 04/28/2019   Lab Results  Component Value Date   MICROALBUR 0.9 03/19/2020   LDLCALC 106 (H) 04/28/2019   CREATININE 1.46 04/25/2020    Lab on 04/25/2020  Component Date Value Ref Range Status  . Sodium 04/25/2020 136  135 - 145 mEq/L Final  . Potassium 04/25/2020 4.1  3.5 - 5.1 mEq/L Final  . Chloride 04/25/2020 103  96 - 112 mEq/L Final  . CO2 04/25/2020 26  19 - 32 mEq/L Final  . Glucose, Bld 04/25/2020 194* 70 - 99 mg/dL Final  . BUN 04/25/2020 18  6 - 23 mg/dL Final  . Creatinine, Ser 04/25/2020 1.46  0.40 - 1.50 mg/dL Final  . GFR 04/25/2020 51.35* >60.00 mL/min Final   Calculated using the CKD-EPI Creatinine Equation (2021)  . Calcium 04/25/2020 9.6  8.4 - 10.5 mg/dL Final  . Fructosamine 04/25/2020 341* 0 - 285 umol/L Final   Comment: Published reference interval for apparently healthy subjects between age 73 and 40 is 66 - 285 umol/L and in a poorly controlled diabetic population is 228 - 563 umol/L with a mean of 396 umol/L.         Allergies as of 05/01/2020      Reactions   Bee Venom Anaphylaxis   Hydrocodone-homatropine Other (See Comments)   Hallucinations, confusion, delirium Depressed feeling   Toradol [ketorolac Tromethamine] Other (See Comments)   Hallucinations, confusion, delirium   Morphine And Related Other (See Comments)   Hallucinations, back in the 80s. States has taken vicodin before w/o problems    Sulfa Drugs Cross Reactors Rash      Medication List       Accurate as of May 01, 2020  4:25 PM. If you have any questions, ask your nurse or doctor.        atorvastatin 20 MG tablet Commonly known as: LIPITOR Take 1 tablet (20 mg total) by mouth at bedtime.   carvedilol 12.5 MG tablet Commonly known as: COREG Take 1 tablet (12.5 mg total) by mouth 2 (two) times daily with a meal.   DULoxetine 60 MG capsule Commonly known as: CYMBALTA Take 2 capsules (120 mg total) by mouth daily.   EPINEPHrine 0.3 mg/0.3 mL Soaj injection Commonly known as: EpiPen 2-Pak Inject 0.3 mLs (0.3 mg total) into the muscle once as needed for up to 1 dose for anaphylaxis.   fenofibrate micronized 134 MG capsule Commonly known as: LOFIBRA TAKE 1 CAPSULE BY MOUTH THREE TIMES WEEKLY   FreeStyle Freedom Lite w/Device Kit Use to check blood sugars 3 times daily. Dx code: E11.9   freestyle lancets Use to check blood sugar 3 times daily. Dx code E11.9   FreeStyle Libre 14 Day Sensor Misc USE AS DIRECTED TO CHECK BLOOD SUGAR   gabapentin 600 MG tablet Commonly known as: NEURONTIN Take 1 tablet (600 mg total) by mouth 2 (two) times daily.   glimepiride 4 MG tablet Commonly known as: AMARYL TAKE 1 TABLET BY MOUTH ONCE DAILY BEFORE BREAKFAST   glucose blood test strip Use Freestyle Freedom Lite test strips as instructed to check blood sugar 4 times daily.   HumaLOG 100 UNIT/ML cartridge Generic drug: insulin lispro Use max 50 units per day in your pump   Humira Pen 40 MG/0.8ML Pnkt Generic  drug: Adalimumab Inject 40 mg into the skin every 14 (fourteen) days.   Iron 325 (65 Fe) MG Tabs Take  1 tablet (325 mg total) by mouth 2 (two) times daily before a meal.   levETIRAcetam 750 MG tablet Commonly known as: KEPPRA Take 1 tablet (750 mg total) by mouth 2 (two) times daily.   metFORMIN 500 MG tablet Commonly known as: GLUCOPHAGE TAKE 2 TABLETS BY MOUTH TWICE DAILY.   omeprazole 40 MG capsule Commonly known as: PRILOSEC Take 1 capsule (40 mg total) by mouth 2 (two) times daily before a meal.   Ozempic (1 MG/DOSE) 2 MG/1.5ML Sopn Generic drug: Semaglutide (1 MG/DOSE) Inject 1 mg into the skin once a week.   traZODone 50 MG tablet Commonly known as: DESYREL Take 1 tablet (50 mg total) by mouth at bedtime.       Allergies:  Allergies  Allergen Reactions  . Bee Venom Anaphylaxis  . Hydrocodone-Homatropine Other (See Comments)    Hallucinations, confusion, delirium Depressed feeling  . Toradol [Ketorolac Tromethamine] Other (See Comments)    Hallucinations, confusion, delirium  . Morphine And Related Other (See Comments)    Hallucinations, back in the 80s. States has taken vicodin before w/o problems   . Sulfa Drugs Cross Reactors Rash    Past Medical History:  Diagnosis Date  . Acid reflux   . Arthritis   . Blood transfusion without reported diagnosis   . Chronic headaches    on cymbalta  . Cirrhosis ([place])   . Colon polyps   . Complication of anesthesia    problems waking up from anesthesia  . Depression    on cymbalta  . Diabetes mellitus with neuropathy ([place])   . Diverticulitis 03/2013  . Eczema   . Elevated LFTs   . Fatty liver   . Hepatitis 10/2017   NASH cirrhosis  . History of kidney stones   . Hyperlipidemia   . Hypertension   . Insomnia 04/26/2013  . Kidney stone   . Morbid obesity ([place])   . Neuromuscular disorder ([facility])    neuropathy  . Neuropathy   . Post-traumatic hydrocephalus ([facility])    s/p shunts x 2 (first got infected )  .  Psoriasis    sees Dr [name]  . Psoriatic arthritis ([place])   . 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 Sleep apnea    no CPAP     . Stomach ulcer   . Testosterone deficiency 04/28/2011    Past Surgical History:  Procedure Laterality Date  . BACK SURGERY  1980  . BRAIN SURGERY     VP shunts placed in 2007  . CHOLECYSTECTOMY N/A 08/25/2017   Procedure: LAPAROSCOPIC CHOLECYSTECTOMY WITH INTRAOPERATIVE CHOLANGIOGRAM;  Surgeon: [name], MD;  Location: [facility];  Service: General;  Laterality: N/A;  . COLONOSCOPY    . JOINT REPLACEMENT     total hip  . LUMBAR FUSION  03/16/2019  . SHOULDER SURGERY Left 2010  . TOE SURGERY Left 2018  . TONSILLECTOMY     as a child  . TOTAL HIP ARTHROPLASTY Left 2011  . UPPER GASTROINTESTINAL ENDOSCOPY  01/04/2020  . VENTRICULOPERITONEAL SHUNT  2007   x2    Family History  Problem Relation Age of Onset  . Other Mother   . Lung cancer Father        alive, former smoker   . Heart disease Brother        MI age 63  . Other Brother        Murdered  . Down syndrome Son   . Diabetes Neg Hx   .  Prostate cancer Neg Hx   . Colon cancer Neg Hx   . Stomach cancer Neg Hx   . Pancreatic cancer Neg Hx   . Liver disease Neg Hx     Social History:  reports that he has never smoked. He has never used smokeless tobacco. He reports that he does not drink alcohol and does not use drugs.    Review of Systems     The following is a copy of the previous note  HYPERTENSION: On carvedilol for treatment Hypertension managed by his PCP   BP Readings from Last 3 Encounters:  04/30/20 130/90  04/17/20 (!) 158/95  03/21/20 122/88   RENAL dysfunction: Has had variable creatinine levels but persistently mildly abnormal  Lab Results  Component Value Date   CREATININE 1.46 04/25/2020   CREATININE 1.36 03/19/2020   CREATININE 1.50 12/21/2019    Lipid history: He is on Lipitor  20 mg, last LDL over 100 and needs  follow-up  Is on fenofibrate for triglyceride treatment, has increased triglycerides with results as below      Lab Results  Component Value Date   CHOL 172 10/09/2019   HDL 30.90 (L) 10/09/2019   LDLCALC 106 (H) 04/28/2019   LDLDIRECT 83.0 10/09/2019   TRIG 283.0 (H) 10/09/2019   CHOLHDL 6 10/09/2019           He is being followed by gastroenterology for liver cirrhosis secondary to [facility]  Lab Results  Component Value Date   ALT 24 10/09/2019     Most recent foot exam: 08/2017  He has symptomatic painful neuropathy and is on gabapentin 600 mg twice a day and  Cymbalta 60 mg Symptoms are relatively well controlled but he is concerned about numbness in his feet  Has been followed by neurologist    Review of Systems    Physical Examination:  There were no vitals taken for this visit.      ASSESSMENT:  Diabetes type 2 with obesity  See history of present illness for discussion of current diabetes management, blood sugar patterns and problems identified  Currently on a regimen of Ozempic, Metformin and 4 mg Amaryl He has been started on the Omni pod insulin pump 2 days ago  A1c is last high at 8.1  He has done better with taking boluses before eating and a little better diet since yesterday He only appears to have some hyperglycemia with a rise in the mornings after about 8 AM as well as late evening after about 9-10 PM Otherwise postprandial readings are relatively well controlled with 12 units boluses for his main meals   PLAN:   He will use a basal rate of 1.2 between 9:30 PM and midnight Also in the morning he will have a basal rate of 0.7 between 8 AM-12 PM Discussed adjusting boluses up or down 2 units for variable carbohydrate intake at meals Continue to bolus for snacks and sweets Follow-up in a month unless having excessive variability   There are no Patient Instructions on file for this visit.     Elayne Snare 05/01/2020, 4:25 PM

## 2020-05-02 ENCOUNTER — Encounter: Payer: Self-pay | Admitting: Endocrinology

## 2020-05-02 ENCOUNTER — Other Ambulatory Visit: Payer: Self-pay

## 2020-05-02 ENCOUNTER — Other Ambulatory Visit: Payer: Self-pay | Admitting: Endocrinology

## 2020-05-02 DIAGNOSIS — R200 Anesthesia of skin: Secondary | ICD-10-CM | POA: Diagnosis not present

## 2020-05-02 DIAGNOSIS — G64 Other disorders of peripheral nervous system: Secondary | ICD-10-CM | POA: Diagnosis not present

## 2020-05-02 MED ORDER — INSULIN LISPRO 100 UNIT/ML ~~LOC~~ SOLN
SUBCUTANEOUS | 1 refills | Status: DC
Start: 2020-05-02 — End: 2020-05-02

## 2020-05-02 MED FILL — HumaLOG 100 UNIT/ML SOLN: 100 | 80 days supply | Qty: 40 | Fill #0

## 2020-05-02 NOTE — Telephone Encounter (Signed)
Spoke with pharmacy

## 2020-05-08 MED FILL — HUMIRA PEN 40 MG/0.8ML PNKT: 40 | 28 days supply | Qty: 2 | Fill #3

## 2020-05-09 ENCOUNTER — Other Ambulatory Visit: Payer: Self-pay | Admitting: Endocrinology

## 2020-05-09 MED FILL — FREESTYLE LIBRE 14 DAY SENS: 28 days supply | Qty: 2 | Fill #3

## 2020-05-09 MED FILL — OMEPRAZOLE 40 MG CPDR: 40 | 90 days supply | Qty: 180 | Fill #3

## 2020-05-09 MED FILL — DULOXETINE HCL 60 MG CPEP: 60 | 90 days supply | Qty: 180 | Fill #1

## 2020-05-09 MED FILL — GABAPENTIN 600 MG TABLET: 600 | 90 days supply | Qty: 180 | Fill #1

## 2020-05-09 MED FILL — GLIMEPIRIDE 4 MG TABLET: 4 | 30 days supply | Qty: 30 | Fill #2

## 2020-05-09 MED FILL — FENOFIBRATE 134 MG CAPSULE: 134 | 28 days supply | Qty: 12 | Fill #0

## 2020-05-09 MED FILL — METFORMIN HCL 500 MG TABS: 500 | 30 days supply | Qty: 120 | Fill #2

## 2020-05-10 ENCOUNTER — Encounter: Payer: Self-pay | Admitting: Internal Medicine

## 2020-05-16 MED FILL — OMNIPOD DASH 5 PACK MISC: 30 days supply | Qty: 10 | Fill #0

## 2020-05-17 ENCOUNTER — Other Ambulatory Visit: Payer: 59

## 2020-05-20 ENCOUNTER — Other Ambulatory Visit: Payer: Self-pay

## 2020-05-20 ENCOUNTER — Telehealth: Payer: Self-pay

## 2020-05-20 DIAGNOSIS — K7460 Unspecified cirrhosis of liver: Secondary | ICD-10-CM

## 2020-05-20 DIAGNOSIS — K7581 Nonalcoholic steatohepatitis (NASH): Secondary | ICD-10-CM

## 2020-05-20 NOTE — Telephone Encounter (Signed)
Pt scheduled for [place] of abd at [facility] 06/03/20@9 :30am, pt to arrive there at 9:15am and be NPO after midnight. Pt notified of appt via mychart.

## 2020-05-20 NOTE — Telephone Encounter (Signed)
-----   Message from Algernon Huxley, RN sent at 11/14/2019  3:10 PM EDT ----- Regarding: [place] Pt needs repeat US of liver in 6 mths

## 2020-05-21 ENCOUNTER — Ambulatory Visit: Payer: 59 | Admitting: Endocrinology

## 2020-05-31 ENCOUNTER — Telehealth (INDEPENDENT_AMBULATORY_CARE_PROVIDER_SITE_OTHER): Payer: 59 | Admitting: Internal Medicine

## 2020-05-31 ENCOUNTER — Encounter: Payer: Self-pay | Admitting: Internal Medicine

## 2020-05-31 ENCOUNTER — Other Ambulatory Visit: Payer: Self-pay | Admitting: Internal Medicine

## 2020-05-31 ENCOUNTER — Other Ambulatory Visit: Payer: 59

## 2020-05-31 ENCOUNTER — Other Ambulatory Visit: Payer: Self-pay

## 2020-05-31 VITALS — BP 120/77 | HR 74 | Temp 98.6°F | Ht 69.0 in | Wt 250.0 lb

## 2020-05-31 DIAGNOSIS — J40 Bronchitis, not specified as acute or chronic: Secondary | ICD-10-CM

## 2020-05-31 MED ORDER — AZITHROMYCIN 250 MG PO TABS: ORAL_TABLET | ORAL | 0 refills | Status: DC

## 2020-05-31 MED ORDER — ALBUTEROL SULFATE HFA 108 (90 BASE) MCG/ACT IN AERS: 2.0000 | INHALATION_SPRAY | Freq: Four times a day (QID) | RESPIRATORY_TRACT | 0 refills | Status: DC | PRN

## 2020-05-31 MED FILL — AZITHROMYCIN 250 MG TABLET: 250 | 5 days supply | Qty: 6 | Fill #0

## 2020-05-31 MED FILL — ALBUTEROL SULFATE HFA 108 (: 108 (90 BAS | 25 days supply | Qty: 18 | Fill #0

## 2020-05-31 NOTE — Progress Notes (Signed)
Subjective:    Patient ID: James [name], male    DOB: 11-16-1955, 64 y.o.   MRN: [id number]  DOS:  05/31/2020 Type of visit - description: Virtual Visit via Video Note  I connected with the above patient  by a video enabled telemedicine application and verified that I am speaking with the correct person using two identifiers.   THIS ENCOUNTER IS A VIRTUAL VISIT DUE TO COVID-19 - PATIENT WAS NOT SEEN IN THE OFFICE. PATIENT HAS CONSENTED TO VIRTUAL VISIT / TELEMEDICINE VISIT   Location of patient: home  Location of provider: office  Persons participating in the virtual visit: patient, provider   I discussed the limitations of evaluation and management by telemedicine and the availability of in person appointments. The patient expressed understanding and agreed to proceed.  Acute: Symptoms started 4 days ago: Cough, chest and sinus congestion, green nasal discharge and sputum production. Wife had similar symptoms before him, she is still sick, she tested negative for Covid. James [name] is currently taking NyQuil and Robitussin.     Review of Systems Denies fevers, has chest pain with cough only.  No shortness of breath, wheezing?. Denies nausea vomiting or unusual aches.  Past Medical History:  Diagnosis Date  . Acid reflux   . Arthritis   . Blood transfusion without reported diagnosis   . Chronic headaches    on cymbalta  . Cirrhosis ([place])   . Colon polyps   . Complication of anesthesia    problems waking up from anesthesia  . Depression    on cymbalta  . Diabetes mellitus with neuropathy ([place])   . Diverticulitis 03/2013  . Eczema   . Elevated LFTs   . Fatty liver   . Hepatitis 10/2017   NASH cirrhosis  . History of kidney stones   . Hyperlipidemia   . Hypertension   . Insomnia 04/26/2013  . Kidney stone   . Morbid obesity ([place])   . Neuromuscular disorder (HCC)    neuropathy  . Neuropathy   . Post-traumatic hydrocephalus (HCC)    s/p shunts x 2 (first got  infected )  . Psoriasis    sees Dr [name]  . Psoriatic arthritis ([place])   . 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 Sleep apnea    no CPAP     . Stomach ulcer   . Testosterone deficiency 04/28/2011    Past Surgical History:  Procedure Laterality Date  . BACK SURGERY  1980  . BRAIN SURGERY     VP shunts placed in 2007  . CHOLECYSTECTOMY N/A 08/25/2017   Procedure: LAPAROSCOPIC CHOLECYSTECTOMY WITH INTRAOPERATIVE CHOLANGIOGRAM;  Surgeon: [name], MD;  Location: [facility];  Service: General;  Laterality: N/A;  . COLONOSCOPY    . JOINT REPLACEMENT     total hip  . LUMBAR FUSION  03/16/2019  . SHOULDER SURGERY Left 2010  . TOE SURGERY Left 2018  . TONSILLECTOMY     as a child  . TOTAL HIP ARTHROPLASTY Left 2011  . UPPER GASTROINTESTINAL ENDOSCOPY  01/04/2020  . VENTRICULOPERITONEAL SHUNT  2007   x2    Allergies as of 05/31/2020      Reactions   Bee Venom Anaphylaxis   Hydrocodone-homatropine Other (See Comments)   Hallucinations, confusion, delirium Depressed feeling   Toradol [ketorolac Tromethamine] Other (See Comments)   Hallucinations, confusion, delirium   Morphine And Related Other (See Comments)   Hallucinations, back in the 80s. States has taken vicodin  before w/o problems    Sulfa Drugs Cross Reactors Rash      Medication List       Accurate as of May 31, 2020  9:17 AM. If you have any questions, ask your nurse or doctor.        atorvastatin 20 MG tablet Commonly known as: LIPITOR Take 1 tablet (20 mg total) by mouth at bedtime.   carvedilol 12.5 MG tablet Commonly known as: COREG Take 1 tablet (12.5 mg total) by mouth 2 (two) times daily with a meal.   DULoxetine 60 MG capsule Commonly known as: CYMBALTA Take 2 capsules (120 mg total) by mouth daily.   EPINEPHrine 0.3 mg/0.3 mL Soaj injection Commonly known as: EpiPen 2-Pak Inject 0.3 mLs (0.3 mg total) into the muscle once as needed for up to 1 dose for  anaphylaxis.   fenofibrate micronized 134 MG capsule Commonly known as: LOFIBRA TAKE 1 CAPSULE BY MOUTH THREE TIMES WEEKLY   FreeStyle Freedom Lite w/Device Kit Use to check blood sugars 3 times daily. Dx code: E11.9   freestyle lancets Use to check blood sugar 3 times daily. Dx code E11.9   FreeStyle Libre 14 Day Sensor Misc USE AS DIRECTED TO CHECK BLOOD SUGAR   gabapentin 600 MG tablet Commonly known as: NEURONTIN Take 1 tablet (600 mg total) by mouth 2 (two) times daily.   glimepiride 4 MG tablet Commonly known as: AMARYL TAKE 1 TABLET BY MOUTH ONCE DAILY BEFORE BREAKFAST   glucose blood test strip Use Freestyle Freedom Lite test strips as instructed to check blood sugar 4 times daily.   Humira Pen 40 MG/0.8ML Pnkt Generic drug: Adalimumab Inject 40 mg into the skin every 14 (fourteen) days.   insulin lispro 100 UNIT/ML injection Commonly known as: HUMALOG 50 units daily in pump   Iron 325 (65 Fe) MG Tabs Take 1 tablet (325 mg total) by mouth 2 (two) times daily before a meal.   levETIRAcetam 750 MG tablet Commonly known as: KEPPRA Take 1 tablet (750 mg total) by mouth 2 (two) times daily.   metFORMIN 500 MG tablet Commonly known as: GLUCOPHAGE TAKE 2 TABLETS BY MOUTH TWICE DAILY.   omeprazole 40 MG capsule Commonly known as: PRILOSEC Take 1 capsule (40 mg total) by mouth 2 (two) times daily before a meal.   Ozempic (1 MG/DOSE) 2 MG/1.5ML Sopn Generic drug: Semaglutide (1 MG/DOSE) Inject 1 mg into the skin once a week.   traZODone 50 MG tablet Commonly known as: DESYREL Take 1 tablet (50 mg total) by mouth at bedtime.          Objective:   Physical Exam BP 120/77 (BP Location: Left Arm, Patient Position: Sitting, Cuff Size: Large)   Pulse 74   Temp 98.6 F (37 C) (Temporal)   Ht 5' 9"  (1.753 m)   Wt 250 lb (113.4 kg)   SpO2 95%   BMI 36.92 kg/m  This is a video visit, he is alert oriented x3, speaking in complete sentences, cough noted along  with large airway congestion, some wheezing?.  Nontoxic-appearing    Assessment     Assessment  DM - Dr [name] Neuropathy (x years, rx gaba 05-2014, w/u 11-2014  RPR neg, vit D-B12-Folic Acid wnl ); Saw Dr Posey Pronto, NCS [phone] (see results) HTN: History of AKI with ARBs  CRI, sees nephrology, etiology felt to beHyperglycemia, intermittent NSAIDs, contrast exposures Hyperlipidemia (TG in the 500s 2016) OSA , dx 2012, sleep study again 02-2015 Dr Dohmeier--> severe OSA,  intolerant to CPAP Depression, insomnia: on Cymbalta NEURO: --Chronic headaches :on Cymbalta  --Posttraumatic hydrocephalus s/p 2 shunts (first got infected) MSK: on disability d/t back pain- HAs GI:  --GERD, diverticulitis 2014, h/o PUD -- NASH with cirrhosis per GI note 10/2017, s/p Hep A/B shots --Anemia: - felt to be d/t   GAVE (gastric antral vascular ectasia) and a inflammatory polyp, s/p  EGD 10/2018    -Work-up repeated 10/2019: EGD: GAVE  versus portal hypertensive gastropathy. Colonoscopy polyps.  Tubular adenoma. Gastric BX negative H. pylori, reactive changes. Psoriasis, psoriatic arthritis: used  HUMIRA  CV: +FH CAD brother MI age 69 Abnormal EKG, saw cardiology 01-2019, echo essentially negative, perfusion stress test 03/01/2019 (-) H/o urolithiasis Hypogonadism  Dx 2012, normal T 11-2014 (on no RX)  PLAN Bronchitis: Respiratory symptoms could be due to bronchitis, viral syndrome, even Covid although he has been vaccinated x3. Wife has similar symptoms, she tested negative for Covid. No history of asthma but some wheezing noted. Plan: Z-Pak, albuterol , Mucinex, recommend to get a PCR Covid testing (if he is not better and Covid came back positive we could potentially treat him with infusion, he has multiple risk factors). Detailed instructions sent via message and discussed with the patient and his wife, they verbalized understanding..      I discussed the assessment and treatment plan with the patient. The  patient was provided an opportunity to ask questions and all were answered. The patient agreed with the plan and demonstrated an understanding of the instructions.   The patient was advised to call back or seek an in-person evaluation if the symptoms worsen or if the condition fails to improve as anticipated.

## 2020-06-02 DIAGNOSIS — Z20822 Contact with and (suspected) exposure to covid-19: Secondary | ICD-10-CM | POA: Diagnosis not present

## 2020-06-02 NOTE — Assessment & Plan Note (Signed)
Bronchitis: Respiratory symptoms could be due to bronchitis, viral syndrome, even Covid although he has been vaccinated x3. Wife has similar symptoms, she tested negative for Covid. No history of asthma but some wheezing noted. Plan: Z-Pak, albuterol , Mucinex, recommend to get a PCR Covid testing (if he is not better and Covid came back positive we could potentially treat him with infusion, he has multiple risk factors). Detailed instructions sent via message and discussed with the patient and his wife, they verbalized understanding.Marland Kitchen

## 2020-06-03 ENCOUNTER — Ambulatory Visit (HOSPITAL_COMMUNITY): Payer: 59 | Attending: Internal Medicine

## 2020-06-04 ENCOUNTER — Ambulatory Visit: Payer: 59 | Admitting: Endocrinology

## 2020-06-04 ENCOUNTER — Encounter: Payer: Self-pay | Admitting: Internal Medicine

## 2020-06-04 MED FILL — HUMIRA PEN 40 MG/0.8ML PNKT: 40 | 28 days supply | Qty: 2 | Fill #4

## 2020-06-05 ENCOUNTER — Other Ambulatory Visit: Payer: Self-pay | Admitting: Endocrinology

## 2020-06-05 MED FILL — FREESTYLE LIBRE 14 DAY SENS: 28 days supply | Qty: 2 | Fill #0

## 2020-06-11 ENCOUNTER — Encounter: Payer: Self-pay | Admitting: Internal Medicine

## 2020-06-11 ENCOUNTER — Ambulatory Visit (INDEPENDENT_AMBULATORY_CARE_PROVIDER_SITE_OTHER): Payer: 59 | Admitting: Internal Medicine

## 2020-06-11 ENCOUNTER — Other Ambulatory Visit: Payer: Self-pay

## 2020-06-11 VITALS — BP 131/88 | HR 73 | Temp 97.8°F | Ht 69.0 in | Wt 253.0 lb

## 2020-06-11 DIAGNOSIS — Z0000 Encounter for general adult medical examination without abnormal findings: Secondary | ICD-10-CM | POA: Diagnosis not present

## 2020-06-11 DIAGNOSIS — I10 Essential (primary) hypertension: Secondary | ICD-10-CM

## 2020-06-11 DIAGNOSIS — Z23 Encounter for immunization: Secondary | ICD-10-CM | POA: Diagnosis not present

## 2020-06-11 DIAGNOSIS — Z125 Encounter for screening for malignant neoplasm of prostate: Secondary | ICD-10-CM

## 2020-06-11 DIAGNOSIS — E785 Hyperlipidemia, unspecified: Secondary | ICD-10-CM

## 2020-06-11 DIAGNOSIS — D649 Anemia, unspecified: Secondary | ICD-10-CM

## 2020-06-11 LAB — LIPID PANEL
Cholesterol: 171 mg/dL (ref 0–200)
HDL: 41.2 mg/dL (ref >39.00)
NonHDL: 129.69
Total CHOL/HDL Ratio: 4
Triglycerides: 214 mg/dL — ABNORMAL HIGH (ref 0.0–149.0)
VLDL: 42.8 mg/dL — ABNORMAL HIGH (ref 0.0–40.0)

## 2020-06-11 LAB — IRON: Iron: 93 ug/dL (ref 42–165)

## 2020-06-11 LAB — AST: AST: 29 U/L (ref 0–37)

## 2020-06-11 LAB — FERRITIN: Ferritin: 55.1 ng/mL (ref 22.0–322.0)

## 2020-06-11 LAB — TSH: TSH: 1.57 u[IU]/mL (ref 0.35–4.50)

## 2020-06-11 LAB — ALT: ALT: 32 U/L (ref 0–53)

## 2020-06-11 LAB — PSA: PSA: 0.45 ng/mL (ref 0.10–4.00)

## 2020-06-11 LAB — LDL CHOLESTEROL, DIRECT: Direct LDL: 91 mg/dL

## 2020-06-11 MED FILL — ATORVASTATIN CALCIUM 20 MG: 20 | 90 days supply | Qty: 90 | Fill #1

## 2020-06-11 NOTE — Patient Instructions (Signed)
GO TO THE LAB : Get the blood work     [place], [place] back for for a checkup in 6 months

## 2020-06-11 NOTE — Progress Notes (Signed)
Subjective:    Patient ID: James [name], male    DOB: 1955-08-12, 64 y.o.   MRN: [id number]  DOS:  06/11/2020 Type of visit - description: Here for CPX  In general feels well. Was recently seen with bronchitis, feeling better.  He has mild cough.  Review of Systems Specifically denies chest pain, difficulty breathing. No LUTS  Other than above, a 14 point review of systems is negative      Past Medical History:  Diagnosis Date  . Acid reflux   . Arthritis   . Blood transfusion without reported diagnosis   . Chronic headaches    on cymbalta  . Cirrhosis ([place])   . Colon polyps   . Complication of anesthesia    problems waking up from anesthesia  . Depression    on cymbalta  . Diabetes mellitus with neuropathy ([place])   . Diverticulitis 03/2013  . Eczema   . Elevated LFTs   . Fatty liver   . Hepatitis 10/2017   NASH cirrhosis  . History of kidney stones   . Hyperlipidemia   . Hypertension   . Insomnia 04/26/2013  . Kidney stone   . Morbid obesity ([place])   . Neuromuscular disorder (H[facility])    neuropathy  . Neuropathy   . Post-traumatic hydrocephalus ([facility])    s/p shunts x 2 (first got infected )  . Psoriasis    sees Dr [name]  . Psoriatic arthritis ([place])   . 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 Sleep apnea    no CPAP     . Stomach ulcer   . Testosterone deficiency 04/28/2011    Past Surgical History:  Procedure Laterality Date  . BACK SURGERY  1980  . BRAIN SURGERY     VP shunts placed in 2007  . CHOLECYSTECTOMY N/A 08/25/2017   Procedure: LAPAROSCOPIC CHOLECYSTECTOMY WITH INTRAOPERATIVE CHOLANGIOGRAM;  Surgeon: [name], MD;  Location: [facility];  Service: General;  Laterality: N/A;  . COLONOSCOPY    . JOINT REPLACEMENT     total hip  . LUMBAR FUSION  03/16/2019  . SHOULDER SURGERY Left 2010  . TOE SURGERY Left 2018  . TONSILLECTOMY     as a child  . TOTAL HIP ARTHROPLASTY Left 2011  . UPPER GASTROINTESTINAL  ENDOSCOPY  01/04/2020  . VENTRICULOPERITONEAL SHUNT  2007   x2    Allergies as of 06/11/2020      Reactions   Bee Venom Anaphylaxis   Hydrocodone-homatropine Other (See Comments)   Hallucinations, confusion, delirium Depressed feeling   Toradol [ketorolac Tromethamine] Other (See Comments)   Hallucinations, confusion, delirium   Morphine And Related Other (See Comments)   Hallucinations, back in the 80s. States has taken vicodin before w/o problems    Sulfa Drugs Cross Reactors Rash      Medication List       Accurate as of June 11, 2020  5:30 PM. If you have any questions, ask your nurse or doctor.        STOP taking these medications   azithromycin 250 MG tablet Commonly known as: Zithromax Z-Pak Stopped by: Kathlene November, MD     TAKE these medications   albuterol 108 (90 Base) MCG/ACT inhaler Commonly known as: VENTOLIN HFA Inhale 2 puffs into the lungs every 6 (six) hours as needed for wheezing or shortness of breath.   atorvastatin 20 MG tablet Commonly known as: LIPITOR Take 1 tablet (20 mg total) by mouth  at bedtime.   carvedilol 12.5 MG tablet Commonly known as: COREG Take 1 tablet (12.5 mg total) by mouth 2 (two) times daily with a meal.   DULoxetine 60 MG capsule Commonly known as: CYMBALTA Take 2 capsules (120 mg total) by mouth daily.   EPINEPHrine 0.3 mg/0.3 mL Soaj injection Commonly known as: EpiPen 2-Pak Inject 0.3 mLs (0.3 mg total) into the muscle once as needed for up to 1 dose for anaphylaxis.   fenofibrate micronized 134 MG capsule Commonly known as: LOFIBRA TAKE 1 CAPSULE BY MOUTH THREE TIMES WEEKLY   FreeStyle Freedom Lite w/Device Kit Use to check blood sugars 3 times daily. Dx code: E11.9   freestyle lancets Use to check blood sugar 3 times daily. Dx code E11.9   FreeStyle Libre 14 Day Sensor Misc USE AS DIRECTED TO CHECK BLOOD SUGAR   gabapentin 600 MG tablet Commonly known as: NEURONTIN Take 1 tablet (600 mg total) by mouth  2 (two) times daily.   glimepiride 4 MG tablet Commonly known as: AMARYL TAKE 1 TABLET BY MOUTH ONCE DAILY BEFORE BREAKFAST   glucose blood test strip Use Freestyle Freedom Lite test strips as instructed to check blood sugar 4 times daily.   Humira Pen 40 MG/0.8ML Pnkt Generic drug: Adalimumab Inject 40 mg into the skin every 14 (fourteen) days.   insulin lispro 100 UNIT/ML injection Commonly known as: HUMALOG 50 units daily in pump   Iron 325 (65 Fe) MG Tabs Take 1 tablet (325 mg total) by mouth 2 (two) times daily before a meal.   levETIRAcetam 750 MG tablet Commonly known as: KEPPRA Take 1 tablet (750 mg total) by mouth 2 (two) times daily.   metFORMIN 500 MG tablet Commonly known as: GLUCOPHAGE TAKE 2 TABLETS BY MOUTH TWICE DAILY.   omeprazole 40 MG capsule Commonly known as: PRILOSEC Take 1 capsule (40 mg total) by mouth 2 (two) times daily before a meal.   Ozempic (1 MG/DOSE) 2 MG/1.5ML Sopn Generic drug: Semaglutide (1 MG/DOSE) Inject 1 mg into the skin once a week.   traZODone 50 MG tablet Commonly known as: DESYREL Take 1 tablet (50 mg total) by mouth at bedtime.          Objective:   Physical Exam BP 131/88 (BP Location: Right Arm, Patient Position: Sitting, Cuff Size: Large)   Pulse 73   Temp 97.8 F (36.6 C) (Oral)   Ht 5' 9" (1.753 m)   Wt 253 lb (114.8 kg)   SpO2 96%   BMI 37.36 kg/m  General: Well developed, NAD, BMI noted Neck: No  thyromegaly  HEENT:  Normocephalic . Face symmetric, atraumatic Lungs:  CTA B Normal respiratory effort, no intercostal retractions, no accessory muscle use. Heart: RRR,  no murmur.  Abdomen:  Not distended, soft, non-tender. No rebound or rigidity.   Lower extremities: no pretibial edema bilaterally DRE: Declined Skin: + Rash noted consistent with psoriasis at the pretibial areas. Neurologic:  alert & oriented X3.  Speech normal, gait appropriate for age and unassisted Strength symmetric and  appropriate for age.  Psych: Cognition and judgment appear intact.  Cooperative with normal attention span and concentration.  Behavior appropriate. No anxious or depressed appearing.     Assessment    Assessment  DM - Dr [name] Neuropathy (x years, rx gaba 05-2014, w/u 11-2014  RPR neg, vit D-B12-Folic Acid wnl ); Saw Dr Posey Pronto, NCS [phone] (see results) HTN: History of AKI with ARBs  CRI, sees nephrology, etiology felt to beHyperglycemia,  intermittent NSAIDs, contrast exposures Hyperlipidemia (TG in the 500s 2016) OSA , dx 2012, sleep study again 02-2015 Dr Dohmeier--> severe OSA, intolerant to CPAP Depression, insomnia: on Cymbalta NEURO: --Chronic headaches :on Cymbalta  --Posttraumatic hydrocephalus s/p 2 shunts (first got infected) MSK: on disability d/t back pain- HAs GI:  --GERD, diverticulitis 2014, h/o PUD -- NASH with cirrhosis per GI note 10/2017, s/p Hep A/B shots --Anemia: - felt to be d/t   GAVE (gastric antral vascular ectasia) and a inflammatory polyp, s/p  EGD 10/2018    -Work-up repeated 10/2019: EGD: GAVE  versus portal hypertensive gastropathy. Colonoscopy polyps.  Tubular adenoma. Gastric BX negative H. pylori, reactive changes. Psoriasis, psoriatic arthritis: used  HUMIRA  CV: +FH CAD brother MI age 19 Abnormal EKG, saw cardiology 01-2019, echo essentially negative, perfusion stress test 03/01/2019 (-) H/o urolithiasis Hypogonadism  Dx 2012, normal T 11-2014 (on no RX)  PLAN DM: Per Endo, recommend eye checks regularly. HTN:BP today is very good, continue carvedilol. Hyperlipidemia, increase triglycerides: On Lipitor, fenofibrate, check FLP. Depression insomnia: On Cymbalta.  Controlled. Bronchitis: Since the last office visit, he was treated for bronchitis, Covid test negative, better except for mild cough. H/o anemia: felt d/t GAVE, on  iron, check iron and ferritin  RTC 6 months     This visit occurred during the SARS-CoV-2 public health emergency.   Safety protocols were in place, including screening questions prior to the visit, additional usage of staff PPE, and extensive cleaning of exam room while observing appropriate contact time as indicated for disinfecting solutions.

## 2020-06-11 NOTE — Assessment & Plan Note (Signed)
--  Td 2014 -PNM 23: 2019 - PNM 13 at age 64 per guidelines - shingrex : declined today again. - s/o covid vax x 3    - flu shot: Today --Colonoscopy 07-2013,  had a polyp.  C-scope 12-2019, next per GI Dr. Henrene Pastor. --Prostate cancer screening: DRE declined today, check a PSA.  No symptoms.  -Diet and exercise discussed --Labs: AST, ALT, FLP, TSH, PSA, iron, ferritin.

## 2020-06-11 NOTE — Assessment & Plan Note (Signed)
DM: Per Endo, recommend eye checks regularly. HTN:BP today is very good, continue carvedilol. Hyperlipidemia, increase triglycerides: On Lipitor, fenofibrate, check FLP. Depression insomnia: On Cymbalta.  Controlled. Bronchitis: Since the last office visit, he was treated for bronchitis, Covid test negative, better except for mild cough. H/o anemia: felt d/t GAVE, on  iron, check iron and ferritin  RTC 6 months

## 2020-06-13 ENCOUNTER — Encounter: Payer: Self-pay | Admitting: Cardiology

## 2020-06-13 ENCOUNTER — Telehealth: Payer: Self-pay | Admitting: Cardiology

## 2020-06-13 ENCOUNTER — Other Ambulatory Visit: Payer: Self-pay

## 2020-06-13 ENCOUNTER — Ambulatory Visit (INDEPENDENT_AMBULATORY_CARE_PROVIDER_SITE_OTHER): Payer: 59 | Admitting: Cardiology

## 2020-06-13 ENCOUNTER — Other Ambulatory Visit: Payer: Self-pay | Admitting: Cardiology

## 2020-06-13 VITALS — BP 124/90 | HR 95 | Ht 70.0 in | Wt 254.0 lb

## 2020-06-13 DIAGNOSIS — K7581 Nonalcoholic steatohepatitis (NASH): Secondary | ICD-10-CM

## 2020-06-13 DIAGNOSIS — R0789 Other chest pain: Secondary | ICD-10-CM | POA: Insufficient documentation

## 2020-06-13 DIAGNOSIS — N182 Chronic kidney disease, stage 2 (mild): Secondary | ICD-10-CM

## 2020-06-13 DIAGNOSIS — R079 Chest pain, unspecified: Secondary | ICD-10-CM

## 2020-06-13 DIAGNOSIS — K7460 Unspecified cirrhosis of liver: Secondary | ICD-10-CM | POA: Diagnosis not present

## 2020-06-13 DIAGNOSIS — I10 Essential (primary) hypertension: Secondary | ICD-10-CM

## 2020-06-13 DIAGNOSIS — E1122 Type 2 diabetes mellitus with diabetic chronic kidney disease: Secondary | ICD-10-CM | POA: Diagnosis not present

## 2020-06-13 HISTORY — DX: Other chest pain: R07.89

## 2020-06-13 MED ORDER — NITROGLYCERIN 0.4 MG SL SUBL: 0.4000 mg | SUBLINGUAL_TABLET | SUBLINGUAL | 5 refills | Status: DC | PRN

## 2020-06-13 MED FILL — NITROGLYCERIN 0.4 MG TAB SL: 0.4 | 8 days supply | Qty: 25 | Fill #0

## 2020-06-13 NOTE — Addendum Note (Signed)
Addended by: Senaida Ores on: 06/13/2020 04:44 PM   Modules accepted: Orders

## 2020-06-13 NOTE — Patient Instructions (Addendum)
Medication Instructions:  Your physician recommends that you continue on your current medications as directed. Please refer to the Current Medication list given to you today.  *If you need a refill on your cardiac medications before your next appointment, please call your pharmacy*   Lab Work: Your physician recommends that you return for lab work today: troponin   Labs 3-7 weeks : before ct   If you have labs (blood work) drawn today and your tests are completely normal, you will receive your results only by: Marland Kitchen MyChart Message (if you have MyChart) OR . A paper copy in the mail If you have any lab test that is abnormal or we need to change your treatment, we will call you to review the results.   Testing/Procedures: Your cardiac CT will be scheduled at one of the below locations:   [facility] [address] [place], [place] [address] [phone]  [place] [address] [place], [place] [address] [phone]  If scheduled at [facility], please arrive at the [facility] main entrance of [facility] 30 minutes prior to test start time. Proceed to the [facility] Radiology Department (first floor) to check-in and test prep.  If scheduled at [facility], please arrive 15 mins early for check-in and test prep.  Please follow these instructions carefully (unless otherwise directed):  Hold all erectile dysfunction medications at least 3 days (72 hrs) prior to test.  On the Night Before the Test: . Be sure to Drink plenty of water. . Do not consume any caffeinated/decaffeinated beverages or chocolate 12 hours prior to your test. . Do not take any antihistamines 12 hours prior to your test.   On the Day of the Test: . Drink plenty of water. Do not drink any water within one hour of the test. . Do not eat any food 4 hours prior to the test. . You may take your  regular medications prior to the test.  . Take carvedilol two hours prior to test. . HOLD Meformin the morning of the test and 48 hours after . HOLD glimepiride the morning of the test  . HOLD: Humalog the morning of test        After the Test: . Drink plenty of water. . After receiving IV contrast, you may experience a mild flushed feeling. This is normal. . On occasion, you may experience a mild rash up to 24 hours after the test. This is not dangerous. If this occurs, you can take Benadryl 25 mg and increase your fluid intake. . If you experience trouble breathing, this can be serious. If it is severe call 911 IMMEDIATELY. If it is mild, please call our office. . If you take any of these medications: Glipizide/Metformin, Avandament, Glucavance, please do not take 48 hours after completing test unless otherwise instructed.   Once we have confirmed authorization from your insurance company, we will call you to set up a date and time for your test. Based on how quickly your insurance processes prior authorizations requests, please allow up to 4 weeks to be contacted for scheduling your Cardiac CT appointment. Be advised that routine Cardiac CT appointments could be scheduled as many as 8 weeks after your provider has ordered it.  For non-scheduling related questions, please contact the cardiac imaging nurse navigator should you have any questions/concerns: Marchia Bond, Cardiac Imaging Nurse [place], Interim Cardiac Imaging Nurse Navigator [facility]  Heart and Vascular Services Direct Office Dial: [phone]   For scheduling needs, including cancellations and rescheduling, please call [place], [phone].     Follow-Up: At [facility], you and your health needs are our priority.  As part of our continuing mission to provide you with exceptional heart care, we have created designated Provider Care Teams.  These Care Teams include your primary Cardiologist (physician)  and Advanced Practice Providers (APPs -  Physician Assistants and Nurse Practitioners) who all work together to provide you with the care you need, when you need it.  We recommend signing up for the patient portal called "MyChart".  Sign up information is provided on this After Visit Summary.  MyChart is used to connect with patients for Virtual Visits (Telemedicine).  Patients are able to view lab/test results, encounter notes, upcoming appointments, etc.  Non-urgent messages can be sent to your provider as well.   To learn more about what you can do with MyChart, go to [email or web address].    Your next appointment:   3 month(s)  The format for your next appointment:   In Person  Provider:   Jenne Campus, MD   Other Instructions   Cardiac CT Angiogram A cardiac CT angiogram is a procedure to look at the heart and the area around the heart. It may be done to help find the cause of chest pains or other symptoms of heart disease. During this procedure, a substance called contrast dye is injected into the blood vessels in the area to be checked. A large X-ray machine, called a CT scanner, then takes detailed pictures of the heart and the surrounding area. The procedure is also sometimes called a coronary CT angiogram, coronary artery scanning, or CTA. A cardiac CT angiogram allows the health care provider to see how well blood is flowing to and from the heart. The health care provider will be able to see if there are any problems, such as:  Blockage or narrowing of the coronary arteries in the heart.  Fluid around the heart.  Signs of weakness or disease in the muscles, valves, and tissues of the heart. Tell a health care provider about:  Any allergies you have. This is especially important if you have had a previous allergic reaction to contrast dye.  All medicines you are taking, including vitamins, herbs, eye drops, creams, and over-the-counter medicines.  Any blood disorders  you have.  Any surgeries you have had.  Any medical conditions you have.  Whether you are pregnant or may be pregnant.  Any anxiety disorders, chronic pain, or other conditions you have that may increase your stress or prevent you from lying still. What are the risks? Generally, this is a safe procedure. However, problems may occur, including:  Bleeding.  Infection.  Allergic reactions to medicines or dyes.  Damage to other structures or organs.  Kidney damage from the contrast dye that is used.  Increased risk of cancer from radiation exposure. This risk is low. Talk with your health care provider about: ? The risks and benefits of testing. ? How you can receive the lowest dose of radiation. What happens before the procedure?  Wear comfortable clothing and remove any jewelry, glasses, dentures, and hearing aids.  Follow instructions from your health care provider about eating and drinking. This may include: ? For 12 hours before the procedure -- avoid caffeine. This includes tea, coffee, soda, energy drinks, and diet pills. Drink plenty of water or other fluids that do not have caffeine in  them. Being well hydrated can prevent complications. ? For 4-6 hours before the procedure -- stop eating and drinking. The contrast dye can cause nausea, but this is less likely if your stomach is empty.  Ask your health care provider about changing or stopping your regular medicines. This is especially important if you are taking diabetes medicines, blood thinners, or medicines to treat problems with erections (erectile dysfunction). What happens during the procedure?   Hair on your chest may need to be removed so that small sticky patches called electrodes can be placed on your chest. These will transmit information that helps to monitor your heart during the procedure.  An IV will be inserted into one of your veins.  You might be given a medicine to control your heart rate during the  procedure. This will help to ensure that good images are obtained.  You will be asked to lie on an exam table. This table will slide in and out of the CT machine during the procedure.  Contrast dye will be injected into the IV. You might feel warm, or you may get a metallic taste in your mouth.  You will be given a medicine called nitroglycerin. This will relax or dilate the arteries in your heart.  The table that you are lying on will move into the CT machine tunnel for the scan.  The person running the machine will give you instructions while the scans are being done. You may be asked to: ? Keep your arms above your head. ? Hold your breath. ? Stay very still, even if the table is moving.  When the scanning is complete, you will be moved out of the machine.  The IV will be removed. The procedure may vary among health care providers and hospitals. What can I expect after the procedure? After your procedure, it is common to have:  A metallic taste in your mouth from the contrast dye.  A feeling of warmth.  A headache from the nitroglycerin. Follow these instructions at home:  Take over-the-counter and prescription medicines only as told by your health care provider.  If you are told, drink enough fluid to keep your urine pale yellow. This will help to flush the contrast dye out of your body.  Most people can return to their normal activities right after the procedure. Ask your health care provider what activities are safe for you.  It is up to you to get the results of your procedure. Ask your health care provider, or the department that is doing the procedure, when your results will be ready.  Keep all follow-up visits as told by your health care provider. This is important. Contact a health care provider if:  You have any symptoms of allergy to the contrast dye. These include: ? Shortness of breath. ? Rash or hives. ? A racing heartbeat. Summary  A cardiac CT angiogram  is a procedure to look at the heart and the area around the heart. It may be done to help find the cause of chest pains or other symptoms of heart disease.  During this procedure, a large X-ray machine, called a CT scanner, takes detailed pictures of the heart and the surrounding area after a contrast dye has been injected into blood vessels in the area.  Ask your health care provider about changing or stopping your regular medicines before the procedure. This is especially important if you are taking diabetes medicines, blood thinners, or medicines to treat erectile dysfunction.  If you  are told, drink enough fluid to keep your urine pale yellow. This will help to flush the contrast dye out of your body. This information is not intended to replace advice given to you by your health care provider. Make sure you discuss any questions you have with your health care provider. Document Revised: 02/08/2019 Document Reviewed: 02/08/2019 Elsevier Patient Education  [place].

## 2020-06-13 NOTE — Progress Notes (Signed)
Cardiology Office Note:    Date:  06/13/2020   ID:  [name], DOB 1956-02-18, MRN [id number]  PCP:  Colon Branch, MD  Cardiologist:  Jenne Campus, MD    Referring MD: Colon Branch, MD   Chief Complaint  Patient presents with  . Chest Pain    History of Present Illness:    James [name] is a 64 y.o. male with past medical history significant for diabetes, hypertension, dyslipidemia, nonalcoholic cirrhosis of the liver.  He requested to be seen today because he woke up at 6:30 in the morning with chest pain pain was located on the left side initially was sharp however then became kind of dull.  No radiation no shortness of breath no sweating associated with this sensation.  Entire duration of the pain was for about half an hour.  After that he was fine he said 1 more time he had this during the day and it subsided after that he was able to go to backyard and work with no difficulties.  He did have numerous stress test done before, likely none showed abnormality.  However at the same time he had multiple risk factors for coronary artery disease and I think it would be appropriate to evaluate him for coronary artery disease  Past Medical History:  Diagnosis Date  . Acid reflux   . Arthritis   . Blood transfusion without reported diagnosis   . Chronic headaches    on cymbalta  . Cirrhosis ([place])   . Colon polyps   . Complication of anesthesia    problems waking up from anesthesia  . Depression    on cymbalta  . Diabetes mellitus with neuropathy ([place])   . Diverticulitis 03/2013  . Eczema   . Elevated LFTs   . Fatty liver   . Hepatitis 10/2017   NASH cirrhosis  . History of kidney stones   . Hyperlipidemia   . Hypertension   . Insomnia 04/26/2013  . Kidney stone   . Morbid obesity ([place])   . Neuromuscular disorder (HCC)    neuropathy  . Neuropathy   . Post-traumatic hydrocephalus (HCC)    s/p shunts x 2 (first got infected )  . Psoriasis    sees Dr [name]  .  Psoriatic arthritis ([place])   . 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 Sleep apnea    no CPAP     . Stomach ulcer   . Testosterone deficiency 04/28/2011    Past Surgical History:  Procedure Laterality Date  . BACK SURGERY  1980  . BRAIN SURGERY     VP shunts placed in 2007  . CHOLECYSTECTOMY N/A 08/25/2017   Procedure: LAPAROSCOPIC CHOLECYSTECTOMY WITH INTRAOPERATIVE CHOLANGIOGRAM;  Surgeon: [name], MD;  Location: [facility];  Service: General;  Laterality: N/A;  . COLONOSCOPY    . JOINT REPLACEMENT     total hip  . LUMBAR FUSION  03/16/2019  . SHOULDER SURGERY Left 2010  . TOE SURGERY Left 2018  . TONSILLECTOMY     as a child  . TOTAL HIP ARTHROPLASTY Left 2011  . UPPER GASTROINTESTINAL ENDOSCOPY  01/04/2020  . VENTRICULOPERITONEAL SHUNT  2007   x2    Current Medications: Current Meds  Medication Sig  . Adalimumab (HUMIRA PEN) 40 MG/0.8ML PNKT Inject 40 mg into the skin every 14 (fourteen) days.  Marland Kitchen albuterol (VENTOLIN HFA) 108 (90 Base) MCG/ACT inhaler Inhale 2 puffs into the lungs every 6 (  six) hours as needed for wheezing or shortness of breath.  Marland Kitchen atorvastatin (LIPITOR) 20 MG tablet Take 1 tablet (20 mg total) by mouth at bedtime.  . carvedilol (COREG) 12.5 MG tablet Take 1 tablet (12.5 mg total) by mouth 2 (two) times daily with a meal.  . DULoxetine (CYMBALTA) 60 MG capsule Take 2 capsules (120 mg total) by mouth daily.  Marland Kitchen EPINEPHrine (EPIPEN 2-PAK) 0.3 mg/0.3 mL IJ SOAJ injection Inject 0.3 mLs (0.3 mg total) into the muscle once as needed for up to 1 dose for anaphylaxis.  . fenofibrate micronized (LOFIBRA) 134 MG capsule TAKE 1 CAPSULE BY MOUTH THREE TIMES WEEKLY  . gabapentin (NEURONTIN) 600 MG tablet Take 1 tablet (600 mg total) by mouth 2 (two) times daily.  Marland Kitchen glimepiride (AMARYL) 4 MG tablet TAKE 1 TABLET BY MOUTH ONCE DAILY BEFORE BREAKFAST  . insulin lispro (HUMALOG) 100 UNIT/ML injection 50 units daily in pump  .  levETIRAcetam (KEPPRA) 750 MG tablet Take 1 tablet (750 mg total) by mouth 2 (two) times daily.  . metFORMIN (GLUCOPHAGE) 500 MG tablet TAKE 2 TABLETS BY MOUTH TWICE DAILY.  Marland Kitchen omeprazole (PRILOSEC) 40 MG capsule Take 1 capsule (40 mg total) by mouth 2 (two) times daily before a meal.  . traZODone (DESYREL) 50 MG tablet Take 1 tablet (50 mg total) by mouth at bedtime.     Allergies:   Bee venom, Hydrocodone-homatropine, Toradol [ketorolac tromethamine], Morphine and related, and Sulfa drugs cross reactors   Social History   Socioeconomic History  . Marital status: Married    Spouse name: [name]  . Number of children: 2  . Years of education: Not on file  . Highest education level: Not on file  Occupational History  . Occupation: disabled   Tobacco Use  . Smoking status: Never Smoker  . Smokeless tobacco: Never Used  Vaping Use  . Vaping Use: Never used  Substance and Sexual Activity  . Alcohol use: No  . Drug use: No  . Sexual activity: Yes    Partners: Female  Other Topics Concern  . 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   Social Determinants of Health   Financial Resource Strain: Not on file  Food Insecurity: Not on file  Transportation Needs: Not on file  Physical Activity: Not on file  Stress: Not on file  Social Connections: Not on file     Family History: The patient's family history includes Down syndrome in his son; Heart disease in his brother; Lung cancer in his father; Other in his brother and mother. There is no history of Diabetes, Prostate cancer, Colon cancer, Stomach cancer, Pancreatic cancer, or Liver disease. ROS:   Please see the history of present illness.    All 14 point review of systems negative except as described per history of present illness  EKGs/Labs/Other Studies Reviewed:       Recent Labs: 03/12/2020: Hemoglobin 14.4; Platelets 159.0 04/25/2020: BUN 18; Creatinine, Ser 1.46; Potassium 4.1; Sodium 136 06/11/2020: ALT 32; TSH 1.57  Recent Lipid Panel    Component Value Date/Time   CHOL 171 06/11/2020 1030   TRIG 214.0 (H) 06/11/2020 1030   HDL 41.20 06/11/2020 1030   CHOLHDL 4 06/11/2020 1030   VLDL 42.8 (H) 06/11/2020 1030   LDLCALC 106 (H) 04/28/2019 1000  LDLDIRECT 91.0 06/11/2020 1030    Physical Exam:    VS:  BP 124/90 (BP Location: Right Arm, Patient Position: Sitting)   Pulse 95   Ht 5' 10"  (1.778 m)   Wt 254 lb (115.2 kg)   SpO2 94%   BMI 36.45 kg/m     Wt Readings from Last 3 Encounters:  06/13/20 254 lb (115.2 kg)  06/11/20 253 lb (114.8 kg)  05/31/20 250 lb (113.4 kg)     GEN:  Well nourished, well developed in no acute distress HEENT: Normal NECK: No JVD; No carotid bruits LYMPHATICS: No lymphadenopathy CARDIAC: RRR, no murmurs, no rubs, no gallops RESPIRATORY:  Clear to auscultation without rales, wheezing or rhonchi  ABDOMEN: Soft, non-tender, non-distended MUSCULOSKELETAL:  No edema; No deformity  SKIN: Warm and dry LOWER EXTREMITIES: no swelling NEUROLOGIC:  Alert and oriented x 3 PSYCHIATRIC:  Normal affect   ASSESSMENT:    1. Chest pain of uncertain etiology   2. Essential hypertension   3. Atypical chest pain   4. Type 2 diabetes mellitus with stage 2 chronic kidney disease, without long-term current use of insulin ([place])   5. Liver cirrhosis secondary to NASH (nonalcoholic steatohepatitis) ([place])    PLAN:    In order of problems listed above:  1. Chest pain I will ask him to have troponin I done.  His EKG did not show any acute changes,I did compare that EKG from EKG from last year and there are no acute changes.  I will schedule him however to have coronary CT angio make sure there is no inducible ischemia.  That will also allow me to prognosticate his symptomatology since almost every year he comes to the  chest pain every year with the stress test. 2. Essential hypertension, blood pressure well controlled today continue present management. 3. Type 2 diabetes followed by internal medicine team.  I do have data from September showing hemoglobin A1c of 8.1 need to be better controlled. 4. Dyslipidemia: I do have his K PN showing LDL of 91 HDL 41, he is on Lipitor 20 which I will continue.   Medication Adjustments/Labs and Tests Ordered: Current medicines are reviewed at length with the patient today.  Concerns regarding medicines are outlined above.  Orders Placed This Encounter  Procedures  . EKG 12-Lead   Medication changes: No orders of the defined types were placed in this encounter.   Signed, Park Liter, MD, [facility] 06/13/2020 4:31 PM    [place]

## 2020-06-13 NOTE — Telephone Encounter (Signed)
Reports chest pain started at 6:30 am this morning rated 5/10 that started sharp then changed to dull that lasted 1 hour. Denies SOB, dizziness or N/V. Denies active chest pain. Appointment scheduled with Dr. Agustin Cree today at 4:20 pm at the [facility] office. Advised if symptoms return or get worse, to go to the ED for an evaluation. Verbalized understanding of plan.

## 2020-06-13 NOTE — Addendum Note (Signed)
Addended by: Senaida Ores on: 06/13/2020 05:04 PM   Modules accepted: Orders

## 2020-06-13 NOTE — Telephone Encounter (Signed)
Pt c/o of Chest Pain: STAT if CP now or developed within 24 hours  1. Are you having CP right now? no  2. Are you experiencing any other symptoms (ex. SOB, nausea, vomiting, sweating)? no  3. How long have you been experiencing CP? Started early this morning  4. Is your CP continuous or coming and going? Came and went  5. Have you taken Nitroglycerin? No   Patient states he started having chest pain at around 6:30 am. He states he is not having the chest pain now. ?

## 2020-06-14 LAB — TROPONIN T: Troponin T (Highly Sensitive): 12 ng/L (ref 0–22)

## 2020-06-17 ENCOUNTER — Other Ambulatory Visit: Payer: 59

## 2020-06-18 ENCOUNTER — Other Ambulatory Visit: Payer: Self-pay | Admitting: Endocrinology

## 2020-06-18 ENCOUNTER — Ambulatory Visit (HOSPITAL_COMMUNITY)
Admission: RE | Admit: 2020-06-18 | Discharge: 2020-06-18 | Disposition: A | Discharge status: Home / Self Care | Payer: 59 | Source: Ambulatory Visit | Attending: Internal Medicine | Admitting: Internal Medicine

## 2020-06-18 ENCOUNTER — Other Ambulatory Visit: Payer: Self-pay

## 2020-06-18 ENCOUNTER — Other Ambulatory Visit (INDEPENDENT_AMBULATORY_CARE_PROVIDER_SITE_OTHER): Payer: 59

## 2020-06-18 DIAGNOSIS — K7460 Unspecified cirrhosis of liver: Secondary | ICD-10-CM | POA: Insufficient documentation

## 2020-06-18 DIAGNOSIS — Z794 Long term (current) use of insulin: Secondary | ICD-10-CM

## 2020-06-18 DIAGNOSIS — E1165 Type 2 diabetes mellitus with hyperglycemia: Secondary | ICD-10-CM | POA: Diagnosis not present

## 2020-06-18 DIAGNOSIS — K7581 Nonalcoholic steatohepatitis (NASH): Secondary | ICD-10-CM | POA: Diagnosis not present

## 2020-06-18 DIAGNOSIS — Z9049 Acquired absence of other specified parts of digestive tract: Secondary | ICD-10-CM | POA: Diagnosis not present

## 2020-06-18 LAB — BASIC METABOLIC PANEL
BUN: 20 mg/dL (ref 6–23)
CO2: 27 mEq/L (ref 19–32)
Calcium: 9.6 mg/dL (ref 8.4–10.5)
Chloride: 104 mEq/L (ref 96–112)
Creatinine, Ser: 1.32 mg/dL (ref 0.40–1.50)
GFR: 57.89 mL/min — ABNORMAL LOW (ref >60.00)
Glucose, Bld: 135 mg/dL — ABNORMAL HIGH (ref 70–99)
Potassium: 4.3 mEq/L (ref 3.5–5.1)
Sodium: 137 mEq/L (ref 135–145)

## 2020-06-18 LAB — HEMOGLOBIN A1C: Hgb A1c MFr Bld: 7.3 % — ABNORMAL HIGH (ref 4.6–6.5)

## 2020-06-19 LAB — FRUCTOSAMINE: Fructosamine: 317 umol/L — ABNORMAL HIGH (ref 0–285)

## 2020-06-20 ENCOUNTER — Telehealth: Payer: Self-pay | Admitting: Internal Medicine

## 2020-06-20 NOTE — Telephone Encounter (Signed)
See result note.  

## 2020-06-24 ENCOUNTER — Encounter: Payer: Self-pay | Admitting: Endocrinology

## 2020-06-24 ENCOUNTER — Other Ambulatory Visit: Payer: Self-pay | Admitting: Endocrinology

## 2020-06-24 ENCOUNTER — Ambulatory Visit (INDEPENDENT_AMBULATORY_CARE_PROVIDER_SITE_OTHER): Payer: 59 | Admitting: Endocrinology

## 2020-06-24 ENCOUNTER — Other Ambulatory Visit: Payer: Self-pay

## 2020-06-24 VITALS — BP 142/88 | HR 75 | Ht 70.0 in | Wt 257.2 lb

## 2020-06-24 DIAGNOSIS — E1165 Type 2 diabetes mellitus with hyperglycemia: Secondary | ICD-10-CM

## 2020-06-24 DIAGNOSIS — E782 Mixed hyperlipidemia: Secondary | ICD-10-CM | POA: Diagnosis not present

## 2020-06-24 DIAGNOSIS — Z794 Long term (current) use of insulin: Secondary | ICD-10-CM

## 2020-06-24 MED ORDER — OZEMPIC (0.25 OR 0.5 MG/DOSE) 2 MG/1.5ML ~~LOC~~ SOPN: 0.5000 mg | PEN_INJECTOR | SUBCUTANEOUS | 2 refills | Status: DC

## 2020-06-24 MED FILL — OZEMPIC 0.25 OR 0.5 MG/DOSE: 2 | 28 days supply | Qty: 2 | Fill #0

## 2020-06-24 NOTE — Progress Notes (Signed)
Patient ID: James [name], male   DOB: 10-10-1955, 64 y.o.   MRN: [id number]           Reason for Appointment: Follow-up for Type 2 Diabetes   History of Present Illness:          Date of diagnosis of type 2 diabetes mellitus:  ?  2014      Background history:  He is not clear how his diabetes was diagnosed, likely on routine testing. Initially had been treated with metformin and also tried on Amaryl Patient had progressive increase in his blood sugars since 1/17 with stopping metformin and being on the regimen of Amaryl and Januvia, he thinks his blood sugars went up to 601.  He was then started on basal bolus insulin   Lowest A1c was 6.8 previously  Recent history:    Non-insulin hypoglycemic drugs the patient is taking are:  Amaryl 34m , metformin 1000 mg twice daily, Ozempic 0.5 mg weekly  OMNIPOD insulin pump settings as follows Basal rates: 12 AM-6 AM = 0.1, 6 AM-11 AM = 0.5, 11 AM- 9:30 PM = 1.0 and 9:30 PM-12 AM = 1.2 Correction 1: 50 carbohydrate coverage 1: 1  His A1c is 7.3 compared to 8.1  Current management, blood sugar patterns and problems identified:   He was started on the pump in 11/21 and he is here for follow-up  His blood sugars are not well controlled as discussed in the interpretation of his CGM below  Apparently he has not been taking his Ozempic for some time  He did not refill the prescription or let [facility]  Although his diet has been poor he is also not bolusing when he is eating and usually bolusing a couple of hours later when the blood sugars go up  He is also snacking late in the evenings and highest blood sugars are around midnight  He thinks he is forgetting to take his bolus before eating especially when he is not at home and eating out but also when he is home  Only remembering to take his bolus when the blood sugar significantly high later in the evenings and not before supper proactively  This may cause low normal or low  sugars early morning and once before dinner also  He is not doing much exercise  His weight is about the same   Side  effects from medications have been: None  Compliance with the medical regimen: Fair  Glucose monitoring:   Glucometer:  Freestyle libre  Interpretation of CGM data:   Patient has marked hyperglycemia late at night with blood sugars escalating after 10 PM until at least 2 AM; the lowest sugars being around 9 AM.  Only has occasional low normal readings at 6 AM and 6 PM  Frequent hyperglycemia present after evening meals and occasionally after breakfast and lunch  Low normal sugars or low sugars are usually preceded by significantly high readings  Overnight blood sugars are mostly significantly higher early part of the night but usually variably improved in the morning  Premeal blood sugars are mostly higher before dinnertime but occasionally low  CGM use % of time  92  2-week average/GV  185/31  Time in range  50     %  % Time Above 180  33  % Time above 250  16  % Time Below 70 1     PRE-MEAL Fasting Lunch Dinner Bedtime Overall  Glucose range:  Averages:  140   169  170  267  185   POST-MEAL PC Breakfast PC Lunch PC Dinner  Glucose range:     Averages: ?   185  209     Previous data:   CGM use % of time  51  Average and SD   Time in range       53%  % Time Above 180  47  % Time above 250   % Time Below target 0    PRE-MEAL Fasting Lunch Dinner Bedtime Overall  Glucose range:       Mean/median:  137  188  210  157    POST-MEAL PC Breakfast PC Lunch PC Dinner  Glucose range:     Mean/median:   212  232     Self-care:   Meal times are:  Breakfast variable, lunch: 12-3 PM Dinner: 6 PM    Dietician visit, most recent:09/2015               CDE consultation: 12/2018  Weight history:  Wt Readings from Last 3 Encounters:  06/24/20 257 lb 3.2 oz (116.7 kg)  06/13/20 254 lb (115.2 kg)  06/11/20 253 lb (114.8 kg)    Glycemic  control:   Lab Results  Component Value Date   HGBA1C 7.3 (H) 06/18/2020   HGBA1C 8.1 (H) 03/19/2020   HGBA1C 8.3 (H) 10/11/2019   Lab Results  Component Value Date   MICROALBUR 0.9 03/19/2020   LDLCALC 106 (H) 04/28/2019   CREATININE 1.32 06/18/2020    Lab on 06/18/2020  Component Date Value Ref Range Status  . Fructosamine 06/18/2020 317* 0 - 285 umol/L Final   Comment: Published reference interval for apparently healthy subjects between age 89 and 10 is 57 - 285 umol/L and in a poorly controlled diabetic population is 228 - 563 umol/L with a mean of 396 umol/L.   Marland Kitchen Sodium 06/18/2020 137  135 - 145 mEq/L Final  . Potassium 06/18/2020 4.3  3.5 - 5.1 mEq/L Final  . Chloride 06/18/2020 104  96 - 112 mEq/L Final  . CO2 06/18/2020 27  19 - 32 mEq/L Final  . Glucose, Bld 06/18/2020 135* 70 - 99 mg/dL Final  . BUN 06/18/2020 20  6 - 23 mg/dL Final  . Creatinine, Ser 06/18/2020 1.32  0.40 - 1.50 mg/dL Final  . GFR 06/18/2020 57.89* >60.00 mL/min Final   Calculated using the CKD-EPI Creatinine Equation (2021)  . Calcium 06/18/2020 9.6  8.4 - 10.5 mg/dL Final  . Hgb A1c MFr Bld 06/18/2020 7.3* 4.6 - 6.5 % Final   Glycemic Control Guidelines for People with Diabetes:Non Diabetic:  <6%Goal of Therapy: <7%Additional Action Suggested:  >8%        Allergies as of 06/24/2020      Reactions   Bee Venom Anaphylaxis   Hydrocodone-homatropine Other (See Comments)   Hallucinations, confusion, delirium Depressed feeling   Toradol [ketorolac Tromethamine] Other (See Comments)   Hallucinations, confusion, delirium   Morphine And Related Other (See Comments)   Hallucinations, back in the 80s. States has taken vicodin before w/o problems    Sulfa Drugs Cross Reactors Rash      Medication List       Accurate as of June 24, 2020  3:17 PM. If you have any questions, ask your nurse or doctor.        albuterol 108 (90 Base) MCG/ACT inhaler Commonly known as: VENTOLIN HFA Inhale  2 puffs into the lungs every  6 (six) hours as needed for wheezing or shortness of breath.   atorvastatin 20 MG tablet Commonly known as: LIPITOR Take 1 tablet (20 mg total) by mouth at bedtime.   carvedilol 12.5 MG tablet Commonly known as: COREG Take 1 tablet (12.5 mg total) by mouth 2 (two) times daily with a meal.   DULoxetine 60 MG capsule Commonly known as: CYMBALTA Take 2 capsules (120 mg total) by mouth daily.   EPINEPHrine 0.3 mg/0.3 mL Soaj injection Commonly known as: EpiPen 2-Pak Inject 0.3 mLs (0.3 mg total) into the muscle once as needed for up to 1 dose for anaphylaxis.   fenofibrate micronized 134 MG capsule Commonly known as: LOFIBRA TAKE 1 CAPSULE BY MOUTH THREE TIMES WEEKLY   gabapentin 600 MG tablet Commonly known as: NEURONTIN Take 1 tablet (600 mg total) by mouth 2 (two) times daily.   glimepiride 4 MG tablet Commonly known as: AMARYL TAKE 1 TABLET BY MOUTH ONCE DAILY BEFORE BREAKFAST   Humira Pen 40 MG/0.8ML Pnkt Generic drug: Adalimumab Inject 40 mg into the skin every 14 (fourteen) days.   insulin lispro 100 UNIT/ML injection Commonly known as: HUMALOG 50 units daily in pump   levETIRAcetam 750 MG tablet Commonly known as: KEPPRA Take 1 tablet (750 mg total) by mouth 2 (two) times daily.   metFORMIN 500 MG tablet Commonly known as: GLUCOPHAGE TAKE 2 TABLETS BY MOUTH TWICE DAILY.   nitroGLYCERIN 0.4 MG SL tablet Commonly known as: NITROSTAT Place 1 tablet (0.4 mg total) under the tongue every 5 (five) minutes as needed for chest pain.   omeprazole 40 MG capsule Commonly known as: PRILOSEC Take 1 capsule (40 mg total) by mouth 2 (two) times daily before a meal.   Ozempic (0.25 or 0.5 MG/DOSE) 2 MG/1.5ML Sopn Generic drug: Semaglutide(0.25 or 0.5MG/DOS) Inject 0.5 mg into the skin once a week. Started by: Elayne Snare, MD   traZODone 50 MG tablet Commonly known as: DESYREL Take 1 tablet (50 mg total) by mouth at bedtime.        Allergies:  Allergies  Allergen Reactions  . Bee Venom Anaphylaxis  . Hydrocodone-Homatropine Other (See Comments)    Hallucinations, confusion, delirium Depressed feeling  . Toradol [Ketorolac Tromethamine] Other (See Comments)    Hallucinations, confusion, delirium  . Morphine And Related Other (See Comments)    Hallucinations, back in the 80s. States has taken vicodin before w/o problems   . Sulfa Drugs Cross Reactors Rash    Past Medical History:  Diagnosis Date  . Acid reflux   . Arthritis   . Blood transfusion without reported diagnosis   . Chronic headaches    on cymbalta  . Cirrhosis ([place])   . Colon polyps   . Complication of anesthesia    problems waking up from anesthesia  . Depression    on cymbalta  . Diabetes mellitus with neuropathy ([place])   . Diverticulitis 03/2013  . Eczema   . Elevated LFTs   . Fatty liver   . Hepatitis 10/2017   NASH cirrhosis  . History of kidney stones   . Hyperlipidemia   . Hypertension   . Insomnia 04/26/2013  . Kidney stone   . Morbid obesity ([place])   . Neuromuscular disorder ([facility])    neuropathy  . Neuropathy   . Post-traumatic hydrocephalus ([facility])    s/p shunts x 2 (first got infected )  . Psoriasis    sees Dr [name]  . Psoriatic arthritis ([place])   . 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 Sleep apnea    no CPAP     . Stomach ulcer   . Testosterone deficiency 04/28/2011    Past Surgical History:  Procedure Laterality Date  . BACK SURGERY  1980  . BRAIN SURGERY     VP shunts placed in 2007  . CHOLECYSTECTOMY N/A 08/25/2017   Procedure: LAPAROSCOPIC CHOLECYSTECTOMY WITH INTRAOPERATIVE CHOLANGIOGRAM;  Surgeon: [name], MD;  Location: [facility];  Service: General;  Laterality: N/A;  . COLONOSCOPY    . JOINT REPLACEMENT     total hip  . LUMBAR FUSION  03/16/2019  . SHOULDER SURGERY Left 2010  . TOE SURGERY Left 2018  . TONSILLECTOMY     as a child  . TOTAL HIP  ARTHROPLASTY Left 2011  . UPPER GASTROINTESTINAL ENDOSCOPY  01/04/2020  . VENTRICULOPERITONEAL SHUNT  2007   x2    Family History  Problem Relation Age of Onset  . Other Mother   . Lung cancer Father        alive, former smoker   . Heart disease Brother        MI age 74  . Other Brother        Murdered  . Down syndrome Son   . Diabetes Neg Hx   . Prostate cancer Neg Hx   . Colon cancer Neg Hx   . Stomach cancer Neg Hx   . Pancreatic cancer Neg Hx   . Liver disease Neg Hx     Social History:  reports that he has never smoked. He has never used smokeless tobacco. He reports that he does not drink alcohol and does not use drugs.    Review of Systems     The following is a copy of the previous note  HYPERTENSION: On carvedilol for treatment Hypertension managed by his PCP   BP Readings from Last 3 Encounters:  06/24/20 (!) 142/88  06/13/20 124/90  06/11/20 131/88   RENAL dysfunction: Has had variable creatinine levels but persistently mildly abnormal  Lab Results  Component Value Date   CREATININE 1.32 06/18/2020   CREATININE 1.46 04/25/2020   CREATININE 1.36 03/19/2020    Lipid history: He is on Lipitor  20 mg  Is on fenofibrate for triglyceride treatment, has increased triglycerides with results as below  LDL is below 100 now   Lab Results  Component Value Date   CHOL 171 06/11/2020   HDL 41.20 06/11/2020   LDLCALC 106 (H) 04/28/2019   LDLDIRECT 91.0 06/11/2020   TRIG 214.0 (H) 06/11/2020   CHOLHDL 4 06/11/2020           He is being followed by gastroenterology for liver cirrhosis secondary to [facility]  Lab Results  Component Value Date   ALT 32 06/11/2020     Most recent foot exam: 08/2017  He has symptomatic painful neuropathy and is on gabapentin 600 mg twice a day and  Cymbalta 60 mg Symptoms are relatively well controlled but he is concerned about numbness in his feet  Has been followed by neurologist    Review of Systems    Physical  Examination:  BP (!) 142/88   Pulse 75   Ht 5' 10"  (1.778 m)   Wt 257 lb 3.2 oz (116.7 kg)   SpO2 97%   BMI 36.90 kg/m       ASSESSMENT:  Diabetes type 2 with obesity  See history of present illness for discussion of current diabetes  management, blood sugar patterns and problems identified  Currently on a regimen of insulin, Metformin 1 g twice daily  He has been started on the Omni pod insulin pump 2 months ago  A1c is relatively better at 7.3  However fructosamine is high at 370  Recent blood sugars are averaging 185 with only 50% of readings within the target range He is not able to remember to do his boluses, use his pump consistently or watch his diet He is also not taking Ozempic and likely snacking more because of this Still not at all motivated to follow all the instructions for better control  Blood sugar patterns and abnormal high or low sugars were discussed He does not know how to program his basal settings    PLAN:   He will need to increase his basal rate in the evenings and will go up to 1.4 at 9:30 PM Also increase basal rate at midnight to 0.39 at 4 AM up to 0.5, 6 AM = 0.63 Restart Ozempic, and the first 2 doses will be 0.25 Will take weekly regularly Basal rates were changed in the office He will increase his suppertime coverage by 2 to 4 units for mealtime coverage Discussed importance of bolusing before eating Also bolus for snacks  Patient Instructions  12-15 units bolus at dinner .  ALWAYS BOLUS WHEN STARTING A MEAK  Bolus 2-4 units for snacks  0ZEMPIC 0.25 TWICE THEN 0.5 WEEKLY        Elayne Snare 06/24/2020, 3:17 PM

## 2020-06-24 NOTE — Patient Instructions (Addendum)
12-15 units bolus at dinner .  ALWAYS BOLUS WHEN STARTING A MEAK  Bolus 2-4 units for snacks  0ZEMPIC 0.25 TWICE THEN 0.5 WEEKLY

## 2020-06-25 ENCOUNTER — Ambulatory Visit: Payer: 59 | Admitting: Cardiology

## 2020-06-27 MED FILL — OMNIPOD DASH 5 PACK MISC: 30 days supply | Qty: 10 | Fill #1

## 2020-06-27 MED FILL — METFORMIN HCL 500 MG TABS: 500 | 30 days supply | Qty: 120 | Fill #3

## 2020-06-27 MED FILL — HumaLOG 100 UNIT/ML SOLN: 100 | 80 days supply | Qty: 40 | Fill #1

## 2020-06-27 MED FILL — FREESTYLE LIBRE 14 DAY SENS: 28 days supply | Qty: 2 | Fill #1

## 2020-07-02 MED FILL — HUMIRA PEN 40 MG/0.8ML PNKT: 40 | 28 days supply | Qty: 2 | Fill #5

## 2020-07-19 MED FILL — FREESTYLE LIBRE 14 DAY SENS: 28 days supply | Qty: 2 | Fill #2

## 2020-07-23 ENCOUNTER — Ambulatory Visit (INDEPENDENT_AMBULATORY_CARE_PROVIDER_SITE_OTHER): Payer: 59 | Admitting: Neurology

## 2020-07-23 ENCOUNTER — Other Ambulatory Visit: Payer: Self-pay | Admitting: Neurology

## 2020-07-23 VITALS — BP 139/80 | HR 80 | Ht 70.0 in | Wt 253.5 lb

## 2020-07-23 DIAGNOSIS — G4733 Obstructive sleep apnea (adult) (pediatric): Secondary | ICD-10-CM

## 2020-07-23 DIAGNOSIS — G911 Obstructive hydrocephalus: Secondary | ICD-10-CM | POA: Diagnosis not present

## 2020-07-23 DIAGNOSIS — G4752 REM sleep behavior disorder: Secondary | ICD-10-CM | POA: Diagnosis not present

## 2020-07-23 DIAGNOSIS — G4700 Insomnia, unspecified: Secondary | ICD-10-CM

## 2020-07-23 DIAGNOSIS — G43709 Chronic migraine without aura, not intractable, without status migrainosus: Secondary | ICD-10-CM | POA: Diagnosis not present

## 2020-07-23 MED ORDER — CLONAZEPAM 0.5 MG PO TABS: 0.5000 mg | ORAL_TABLET | Freq: Every day | ORAL | 3 refills | Status: DC

## 2020-07-23 MED FILL — clonazePAM 0.5 MG TABS: 0.5 | 30 days supply | Qty: 30 | Fill #0

## 2020-07-23 NOTE — Progress Notes (Signed)
[facility]  PATIENT: James [name] DOB: [birth year]  REFERRING DOCTOR OR PCP:  Kathlene November  SOURCE: patient, notes from Dr. [name], imaging results and MRI and CT scans on PACS personally reviewed  _________________________________   HISTORICAL  CHIEF COMPLAINT:  Chief Complaint  Patient presents with  . Botulinum Toxin Injection    RM 12; Here for botox for migraines. B/B 200U total. ZOX:[id number] Exp:07/2022 UJW:[id number]    HISTORY OF PRESENT ILLNESS:  James [name] is a 65 y.o. man with idiopathic intracranial hypertension and recent increase in the frequency and severity of headache  Update 07/23/2020:  He is back to experiencing headaches 29 to 30 days the last month but did better the first 4-6 weeks after the last Botox,     He denies much neck pain (better)  Pain is pressure like mostly.  It is worse on the right.  They are associated with nausea at times.  When the headaches more severe he has photophobia and phonophobia. Sometimes, headaches improve when he is laying down but other times they do not change between sitting and lying down. Moving usually worsens the headaches.  He did not get a benefit from multiple prophylactic medications including Keppra, gabapentin, Emgality, Cymbalta.    He has OSA.  He was unable to tolerate CPAP.  We did discuss other possible treatments but he is not interested at this time.   He has excessive daytime sleepiness and nocturnal insomnia.    He has had some RBD with acticve dreams - with some kicking but not getting out of bed.     Chronic Migraine History: He was having daily HA with migrainous features.   He did not get a benefit from multiple prophylactic medications including Keppra, gabapentin, Emgality, Cymbalta.   Botox therapy started July 2021.  Spine: L-spine MRI 08/15/18 showed progressive epidural lipomatosis in the lower lumbar spine with progressive compression of the thecal sac and spinal stenosis at  L3-4, L4-5, and L5-S1 compared to the prior study. Progressive facet degeneration and anterolisthesis at L4-5 since 2011.   MRI of the cervical spine performed 04/27/2017 showed  thoracic fusion hardware from C7 and below. There is mild spondylosis and disc bulging at C3-C4 through C6-C7. There does not appear to be any significant foraminal narrowing and there is no spinal stenosis.   Idiopthic intracranial Hypertension: He was diagnosed with idiopathic intracranial hypertension many years ago and had a VP shunt placed in 2007. A revision was performed July 2007 due to infection. He has a programmable Medtronic valve. Due to the increased headaches, it was reprogrammed from 1.5-1.0. Tapping of the shunt showed a pressure 160 (was drained to 140 mm). There was no infection. He also had an MRI of the brain and MR venogram. CT had shown the left-sided ventricular catheter extends into the right caudate head, similar to the previous study the MR venogram (11/12/2016) was reportedly normal. The left transverse and sigmoid sinuses are not well evaluated due to the shunt reservoir (but also likely he is right dominant).   CT head 3/13 2020 personally reviewed and no evidence of shunt failure        REVIEW OF SYSTEMS: Constitutional: No fevers, chills, sweats, or change in appetite Eyes: No visual changes, double vision, eye pain Ear, nose and throat: No hearing loss, ear pain, nasal congestion, sore throat Cardiovascular: No chest pain, palpitations Respiratory: No shortness of breath at rest or with exertion.   No wheezes GastrointestinaI: No nausea, vomiting,  diarrhea, abdominal pain, fecal incontinence Genitourinary: No dysuria, urinary retention or frequency.  No nocturia. Musculoskeletal: as above Integumentary: psoriasis on Humira Neurological: as above Psychiatric: No depression at this time.  No anxiety Endocrine: No palpitations, diaphoresis, change in appetite, change in weigh or  increased thirst Hematologic/Lymphatic: No anemia, purpura, petechiae. Allergic/Immunologic: No itchy/runny eyes, nasal congestion, recent allergic reactions, rashes  ALLERGIES: Allergies  Allergen Reactions  . Bee Venom Anaphylaxis  . Hydrocodone-Homatropine Other (See Comments)    Hallucinations, confusion, delirium Depressed feeling  . Toradol [Ketorolac Tromethamine] Other (See Comments)    Hallucinations, confusion, delirium  . Morphine And Related Other (See Comments)    Hallucinations, back in the 80s. States has taken vicodin before w/o problems   . Sulfa Drugs Cross Reactors Rash    HOME MEDICATIONS:  Current Outpatient Medications:  .  Adalimumab (HUMIRA PEN) 40 MG/0.8ML PNKT, Inject 40 mg into the skin every 14 (fourteen) days., Disp: 2 each, Rfl: 5 .  albuterol (VENTOLIN HFA) 108 (90 Base) MCG/ACT inhaler, Inhale 2 puffs into the lungs every 6 (six) hours as needed for wheezing or shortness of breath., Disp: 8 g, Rfl: 0 .  atorvastatin (LIPITOR) 20 MG tablet, Take 1 tablet (20 mg total) by mouth at bedtime., Disp: 90 tablet, Rfl: 1 .  carvedilol (COREG) 12.5 MG tablet, Take 1 tablet (12.5 mg total) by mouth 2 (two) times daily with a meal., Disp: 180 tablet, Rfl: 1 .  clonazePAM (KLONOPIN) 0.5 MG tablet, Take 1 tablet (0.5 mg total) by mouth at bedtime., Disp: 30 tablet, Rfl: 3 .  DULoxetine (CYMBALTA) 60 MG capsule, Take 2 capsules (120 mg total) by mouth daily., Disp: 180 capsule, Rfl: 1 .  EPINEPHrine (EPIPEN 2-PAK) 0.3 mg/0.3 mL IJ SOAJ injection, Inject 0.3 mLs (0.3 mg total) into the muscle once as needed for up to 1 dose for anaphylaxis., Disp: 2 each, Rfl: 2 .  fenofibrate micronized (LOFIBRA) 134 MG capsule, TAKE 1 CAPSULE BY MOUTH THREE TIMES WEEKLY, Disp: 15 capsule, Rfl: 3 .  gabapentin (NEURONTIN) 600 MG tablet, Take 1 tablet (600 mg total) by mouth 2 (two) times daily., Disp: 180 tablet, Rfl: 1 .  glimepiride (AMARYL) 4 MG tablet, TAKE 1 TABLET BY MOUTH ONCE  DAILY BEFORE BREAKFAST, Disp: 30 tablet, Rfl: 3 .  insulin lispro (HUMALOG) 100 UNIT/ML injection, 50 units daily in pump, Disp: 50 mL, Rfl: 1 .  levETIRAcetam (KEPPRA) 750 MG tablet, Take 1 tablet (750 mg total) by mouth 2 (two) times daily., Disp: 60 tablet, Rfl: 0 .  metFORMIN (GLUCOPHAGE) 500 MG tablet, TAKE 2 TABLETS BY MOUTH TWICE DAILY., Disp: 120 tablet, Rfl: 3 .  nitroGLYCERIN (NITROSTAT) 0.4 MG SL tablet, Place 1 tablet (0.4 mg total) under the tongue every 5 (five) minutes as needed for chest pain., Disp: 30 tablet, Rfl: 5 .  omeprazole (PRILOSEC) 40 MG capsule, Take 1 capsule (40 mg total) by mouth 2 (two) times daily before a meal., Disp: 180 capsule, Rfl: 3 .  Semaglutide,0.25 or 0.5MG/DOS, (OZEMPIC, 0.25 OR 0.5 MG/DOSE,) 2 MG/1.5ML SOPN, Inject 0.5 mg into the skin once a week., Disp: 4.5 mL, Rfl: 2 .  traZODone (DESYREL) 50 MG tablet, Take 1 tablet (50 mg total) by mouth at bedtime., Disp: 90 tablet, Rfl: 3  PAST MEDICAL HISTORY: Past Medical History:  Diagnosis Date  . Acid reflux   . Arthritis   . Blood transfusion without reported diagnosis   . Chronic headaches    on cymbalta  . Cirrhosis (  HCC)   . Colon polyps   . Complication of anesthesia    problems waking up from anesthesia  . Depression    on cymbalta  . Diabetes mellitus with neuropathy ([place])   . Diverticulitis 03/2013  . Eczema   . Elevated LFTs   . Fatty liver   . Hepatitis 10/2017   NASH cirrhosis  . History of kidney stones   . Hyperlipidemia   . Hypertension   . Insomnia 04/26/2013  . Kidney stone   . Morbid obesity ([place])   . Neuromuscular disorder (HCC)    neuropathy  . Neuropathy   . Post-traumatic hydrocephalus (HCC)    s/p shunts x 2 (first got infected )  . Psoriasis    sees Dr [name]  . Psoriatic arthritis ([place])   . 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 Sleep apnea    no CPAP     . Stomach ulcer   . Testosterone deficiency 04/28/2011     PAST SURGICAL HISTORY: Past Surgical History:  Procedure Laterality Date  . BACK SURGERY  1980  . BRAIN SURGERY     VP shunts placed in 2007  . CHOLECYSTECTOMY N/A 08/25/2017   Procedure: LAPAROSCOPIC CHOLECYSTECTOMY WITH INTRAOPERATIVE CHOLANGIOGRAM;  Surgeon: [name], MD;  Location: [facility];  Service: General;  Laterality: N/A;  . COLONOSCOPY    . JOINT REPLACEMENT     total hip  . LUMBAR FUSION  03/16/2019  . SHOULDER SURGERY Left 2010  . TOE SURGERY Left 2018  . TONSILLECTOMY     as a child  . TOTAL HIP ARTHROPLASTY Left 2011  . UPPER GASTROINTESTINAL ENDOSCOPY  01/04/2020  . VENTRICULOPERITONEAL SHUNT  2007   x2    FAMILY HISTORY: Family History  Problem Relation Age of Onset  . Other Mother   . Lung cancer Father        alive, former smoker   . Heart disease Brother        MI age 64  . Other Brother        Murdered  . Down syndrome Son   . Diabetes Neg Hx   . Prostate cancer Neg Hx   . Colon cancer Neg Hx   . Stomach cancer Neg Hx   . Pancreatic cancer Neg Hx   . Liver disease Neg Hx     SOCIAL HISTORY:  Social History   Socioeconomic History  . Marital status: Married    Spouse name: [name]  . Number of children: 2  . Years of education: Not on file  . Highest education level: Not on file  Occupational History  . Occupation: disabled   Tobacco Use  . Smoking status: Never Smoker  . Smokeless tobacco: Never Used  Vaping Use  . Vaping Use: Never used  Substance and Sexual Activity  . Alcohol use: No  . Drug use: No  . Sexual activity: Yes    Partners: Female  Other Topics Concern  . 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   Social Determinants of Health   Financial Resource Strain: Not on file  Food Insecurity: Not on file  Transportation Needs: Not on file   Physical Activity: Not on file  Stress: Not on file  Social Connections: Not on file  Intimate Partner Violence: Not on file     PHYSICAL EXAM  Vitals:   07/23/20 1525  BP: 139/80  Pulse: 80  Weight: 253 lb 8 oz (115 kg)  Height: 5' 10"  (1.778 m)    Body mass index is 36.37 kg/m.   General: The patient is well-developed and well-nourished and in no acute distress,     Neck: He hasmild tenderness at the occiput bilaterally.    Skin: Extremities are without significant edema.  Neurologic Exam  Mental status: The patient is alert and oriented x 3 at the time of the examination. The patient has apparent normal recent and remote memory, with an apparently normal attention span and concentration ability.   Speech is normal.  Cranial nerves: Extraocular movements are full.  Facial strength and sensation is normal. OK neck strength  Motor:  Muscle bulk is normal.   Tone is normal. Strength is  5 / 5 in the arms and proximal legs and 4+/5 in the intrinsic foot muscles in both feet.   Coordination: Cerebellar testing shows good finger-nose-finger  Gait and station: Station is normal.   Arthritic gait. . Tandem walk is wide... Romberg is negative.   Reflexes: Deep tendon reflexes are 1 and symmetric in the arms, 2 and symmetric at the knees and absent at the ankles.Marland Kitchen       DIAGNOSTIC DATA (LABS, IMAGING, TESTING) - I reviewed patient records, labs, notes, testing and imaging myself where available.  Lab Results  Component Value Date   WBC 6.1 03/12/2020   HGB 14.4 03/12/2020   HCT 43.0 03/12/2020   MCV 89.6 03/12/2020   PLT 159.0 03/12/2020      Component Value Date/Time   NA 137 06/18/2020 1012   NA 139 02/24/2019 0000   K 4.3 06/18/2020 1012   CL 104 06/18/2020 1012   CO2 27 06/18/2020 1012   GLUCOSE 135 (H) 06/18/2020 1012   BUN 20 06/18/2020 1012   BUN 21 02/24/2019 0000   CREATININE 1.32 06/18/2020 1012   CALCIUM 9.6 06/18/2020 1012   PROT 7.2 10/09/2019  1005   ALBUMIN 4.1 10/09/2019 1005   AST 29 06/11/2020 1030   ALT 32 06/11/2020 1030   ALKPHOS 100 10/09/2019 1005   BILITOT 0.7 10/09/2019 1005   GFRNONAA 47 (L) 03/19/2019 1303   GFRAA 55 (L) 03/19/2019 1303   Lab Results  Component Value Date   CHOL 171 06/11/2020   HDL 41.20 06/11/2020   LDLCALC 106 (H) 04/28/2019   LDLDIRECT 91.0 06/11/2020   TRIG 214.0 (H) 06/11/2020   CHOLHDL 4 06/11/2020   Lab Results  Component Value Date   HGBA1C 7.3 (H) 06/18/2020   Lab Results  Component Value Date   VITAMINB12 490 06/10/2018   Lab Results  Component Value Date   TSH 1.57 06/11/2020       ASSESSMENT AND PLAN  Chronic migraine w/o aura, not intractable, w/o stat migr - Plan: MR BRAIN W WO CONTRAST  OSA -- dx ~ 2012, cpap intolerant  Obstructive hydrocephalus (HCC)  Insomnia, unspecified type  REM behavioral disorder  1.  Botox 180 units as follows: Frontalis muscle (5 units x 4), corrugators and procerus (5 units x 3), temporalis (5 units x 8), occipitalis (5 units x 4), splenius capitis (12.5 units x 2), splenius cervicis (12.5 units x 2), C6-C7 paraspinal (5 units x 2), trapezius (12.5 units x 2)  2.   Strongly advised to wear CPAP or consider surgery. 3.   Clonazepam 0.5  mg for RBD and insomnia 4.   His headaches are worsening we will check an MRI of the brain. He has had a programmable VP shunt placed. He has had MRIs since the shunt was placed. 5.  rtc 6 months, sooner if new or worsening issues.  Pavneet Markwood A. Felecia Shelling, MD, [facility] 0/38/3338, 3:29 PM Certified in Neurology, Clinical Neurophysiology, Sleep Medicine, Pain Medicine and Neuroimaging  [facility] [address], [place], [place] [address] [phone]

## 2020-07-24 ENCOUNTER — Telehealth: Payer: Self-pay | Admitting: Neurology

## 2020-07-24 NOTE — Telephone Encounter (Signed)
Medicare/cone umr order sent to GI. No auth they will reach out to the patient to schedule.

## 2020-07-25 ENCOUNTER — Other Ambulatory Visit: Payer: Self-pay | Admitting: Pharmacist

## 2020-07-25 MED ORDER — HUMIRA PEN 40 MG/0.8ML ~~LOC~~ PNKT: PEN_INJECTOR | SUBCUTANEOUS | 1 refills | Status: DC

## 2020-07-26 ENCOUNTER — Ambulatory Visit
Admission: RE | Admit: 2020-07-26 | Discharge: 2020-07-26 | Disposition: A | Discharge status: Home / Self Care | Payer: 59 | Source: Ambulatory Visit | Attending: Neurology | Admitting: Neurology

## 2020-07-26 DIAGNOSIS — G43709 Chronic migraine without aura, not intractable, without status migrainosus: Secondary | ICD-10-CM | POA: Diagnosis not present

## 2020-07-26 MED ORDER — GADOBENATE DIMEGLUMINE 529 MG/ML IV SOLN
20.0000 mL | Freq: Once | INTRAVENOUS | Status: AC | PRN
  Administered 2020-07-26: 20 mL via INTRAVENOUS

## 2020-07-29 ENCOUNTER — Ambulatory Visit (INDEPENDENT_AMBULATORY_CARE_PROVIDER_SITE_OTHER): Payer: 59 | Admitting: Endocrinology

## 2020-07-29 ENCOUNTER — Other Ambulatory Visit: Payer: Self-pay

## 2020-07-29 ENCOUNTER — Encounter: Payer: Self-pay | Admitting: Endocrinology

## 2020-07-29 ENCOUNTER — Ambulatory Visit: Payer: 59 | Admitting: Endocrinology

## 2020-07-29 VITALS — BP 130/82 | HR 78 | Ht 70.0 in | Wt 255.2 lb

## 2020-07-29 DIAGNOSIS — E1165 Type 2 diabetes mellitus with hyperglycemia: Secondary | ICD-10-CM

## 2020-07-29 DIAGNOSIS — Z794 Long term (current) use of insulin: Secondary | ICD-10-CM | POA: Diagnosis not present

## 2020-07-29 MED FILL — HUMIRA PEN 40 MG/0.8ML PNKT: 40 | 28 days supply | Qty: 2 | Fill #0

## 2020-07-29 NOTE — Progress Notes (Unsigned)
Patient ID: James [name], male   DOB: May 07, 1956, 65 y.o.   MRN: [id number]           Reason for Appointment: Follow-up for Type 2 Diabetes   History of Present Illness:          Date of diagnosis of type 2 diabetes mellitus:  ?  2014      Background history:  He is not clear how his diabetes was diagnosed, likely on routine testing. Initially had been treated with metformin and also tried on Amaryl Patient had progressive increase in his blood sugars since 1/17 with stopping metformin and being on the regimen of Amaryl and Januvia, he thinks his blood sugars went up to 601.  He was then started on basal bolus insulin   Lowest A1c was 6.8 previously  Recent history:    Non-insulin hypoglycemic drugs the patient is taking are:  Amaryl 82m , metformin 1000 mg twice daily, Ozempic 0.5 mg weekly  OMNIPOD insulin pump settings as follows Basal rates: 12 AM-6 AM = 0.1, 6 AM-11 AM = 0.5, 11 AM- 9:30 PM = 1.0 and 9:30 PM-12 AM = 1.2 Correction 1: 50 carbohydrate coverage 1: 1  His A1c is last 7.3 compared to 8.1  Current management, blood sugar patterns and problems identified:   He was started on Ozempic again about a month ago which he had not been taking for some time and not clear why  Not clear if he started with a 0.25 mg weekly as instructed and is taking 0.5 mg apparently without any nausea  However his blood sugars are overall significantly better compared to his last visit  He has checked his blood sugars consistently when he has the sensor on  Diet has been inconsistent, also not able to exercise  Because of having an MRI scan he was told to take off his pump but the left the pump off for almost 4 days now  He has not taking boluses usually even with blood sugars getting higher periodically after lunch or dinner based on his diet  FASTING blood sugars when on the pump are fairly good  However he may tend to have low normal readings early morning around 4-6  AM without symptoms  Blood sugars were about 300 when he was not on the pump on one occasion   Side  effects from medications have been: None  Compliance with the medical regimen: Fair  Glucose monitoring:   Glucometer:  [employer] Interpretation of CGM data:   Blood sugars are generally well controlled but somewhat higher in the afternoons and late evenings  Most of his high sugars were related to only a couple of days of high readings when he was not on the pump  CGM use % of time  87  2-week average/GV  137/34, was 185  Time in range    80    %  % Time Above 180  16  % Time above 250   % Time Below 70 4     PRE-MEAL Fasting Lunch Dinner Bedtime Overall  Glucose range:       Averages:  105  122  134     POST-MEAL PC Breakfast PC Lunch PC Dinner  Glucose range:     Averages:  111  170  145    PREVIOUSLY  CGM use % of time  92  2-week average/GV  185/31  Time in range  50     %  %  Time Above 180  33  % Time above 250  16  % Time Below 70 1     PRE-MEAL Fasting Lunch Dinner Bedtime Overall  Glucose range:       Averages:  140   169  170  267  185   POST-MEAL PC Breakfast PC Lunch PC Dinner  Glucose range:     Averages: ?   185  209    Self-care:   Meal times are:  Breakfast variable, lunch: 12-3 PM Dinner: 6 PM    Dietician visit, most recent:09/2015               CDE consultation: 12/2018  Weight history:  Wt Readings from Last 3 Encounters:  07/29/20 255 lb 3.2 oz (115.8 kg)  07/23/20 253 lb 8 oz (115 kg)  06/24/20 257 lb 3.2 oz (116.7 kg)    Glycemic control:   Lab Results  Component Value Date   HGBA1C 7.3 (H) 06/18/2020   HGBA1C 8.1 (H) 03/19/2020   HGBA1C 8.3 (H) 10/11/2019   Lab Results  Component Value Date   MICROALBUR 0.9 03/19/2020   LDLCALC 106 (H) 04/28/2019   CREATININE 1.32 06/18/2020    No visits with results within 1 Week(s) from this visit.  Latest known visit with results is:  Lab on 06/18/2020  Component  Date Value Ref Range Status  . Fructosamine 06/18/2020 317* 0 - 285 umol/L Final   Comment: Published reference interval for apparently healthy subjects between age 68 and 58 is 66 - 285 umol/L and in a poorly controlled diabetic population is 228 - 563 umol/L with a mean of 396 umol/L.   Marland Kitchen Sodium 06/18/2020 137  135 - 145 mEq/L Final  . Potassium 06/18/2020 4.3  3.5 - 5.1 mEq/L Final  . Chloride 06/18/2020 104  96 - 112 mEq/L Final  . CO2 06/18/2020 27  19 - 32 mEq/L Final  . Glucose, Bld 06/18/2020 135* 70 - 99 mg/dL Final  . BUN 06/18/2020 20  6 - 23 mg/dL Final  . Creatinine, Ser 06/18/2020 1.32  0.40 - 1.50 mg/dL Final  . GFR 06/18/2020 57.89* >60.00 mL/min Final   Calculated using the CKD-EPI Creatinine Equation (2021)  . Calcium 06/18/2020 9.6  8.4 - 10.5 mg/dL Final  . Hgb A1c MFr Bld 06/18/2020 7.3* 4.6 - 6.5 % Final   Glycemic Control Guidelines for People with Diabetes:Non Diabetic:  <6%Goal of Therapy: <7%Additional Action Suggested:  >8%        Allergies as of 07/29/2020      Reactions   Bee Venom Anaphylaxis   Hydrocodone-homatropine Other (See Comments)   Hallucinations, confusion, delirium Depressed feeling   Toradol [ketorolac Tromethamine] Other (See Comments)   Hallucinations, confusion, delirium   Morphine And Related Other (See Comments)   Hallucinations, back in the 80s. States has taken vicodin before w/o problems    Sulfa Drugs Cross Reactors Rash      Medication List       Accurate as of July 29, 2020 11:59 PM. If you have any questions, ask your nurse or doctor.        albuterol 108 (90 Base) MCG/ACT inhaler Commonly known as: VENTOLIN HFA Inhale 2 puffs into the lungs every 6 (six) hours as needed for wheezing or shortness of breath.   atorvastatin 20 MG tablet Commonly known as: LIPITOR Take 1 tablet (20 mg total) by mouth at bedtime.   carvedilol 12.5 MG tablet Commonly known as: COREG Take 1 tablet (  12.5 mg total) by mouth 2 (two)  times daily with a meal.   clonazePAM 0.5 MG tablet Commonly known as: KLONOPIN Take 1 tablet (0.5 mg total) by mouth at bedtime.   DULoxetine 60 MG capsule Commonly known as: CYMBALTA Take 2 capsules (120 mg total) by mouth daily.   EPINEPHrine 0.3 mg/0.3 mL Soaj injection Commonly known as: EpiPen 2-Pak Inject 0.3 mLs (0.3 mg total) into the muscle once as needed for up to 1 dose for anaphylaxis.   fenofibrate micronized 134 MG capsule Commonly known as: LOFIBRA TAKE 1 CAPSULE BY MOUTH THREE TIMES WEEKLY   FreeStyle Libre 14 Day Sensor Misc Apply topically as directed.   gabapentin 600 MG tablet Commonly known as: NEURONTIN Take 1 tablet (600 mg total) by mouth 2 (two) times daily.   glimepiride 4 MG tablet Commonly known as: AMARYL TAKE 1 TABLET BY MOUTH ONCE DAILY BEFORE BREAKFAST   Humira Pen 40 MG/0.8ML Pnkt Generic drug: Adalimumab Inject 0.8 mls under the skin every other week.   insulin lispro 100 UNIT/ML injection Commonly known as: HUMALOG 50 units daily in pump   levETIRAcetam 750 MG tablet Commonly known as: KEPPRA Take 1 tablet (750 mg total) by mouth 2 (two) times daily.   metFORMIN 500 MG tablet Commonly known as: GLUCOPHAGE TAKE 2 TABLETS BY MOUTH TWICE DAILY.   nitroGLYCERIN 0.4 MG SL tablet Commonly known as: NITROSTAT Place 1 tablet (0.4 mg total) under the tongue every 5 (five) minutes as needed for chest pain.   omeprazole 40 MG capsule Commonly known as: PRILOSEC Take 1 capsule (40 mg total) by mouth 2 (two) times daily before a meal.   Ozempic (0.25 or 0.5 MG/DOSE) 2 MG/1.5ML Sopn Generic drug: Semaglutide(0.25 or 0.5MG/DOS) Inject 0.5 mg into the skin once a week.   traZODone 50 MG tablet Commonly known as: DESYREL Take 1 tablet (50 mg total) by mouth at bedtime.       Allergies:  Allergies  Allergen Reactions  . Bee Venom Anaphylaxis  . Hydrocodone-Homatropine Other (See Comments)    Hallucinations, confusion,  delirium Depressed feeling  . Toradol [Ketorolac Tromethamine] Other (See Comments)    Hallucinations, confusion, delirium  . Morphine And Related Other (See Comments)    Hallucinations, back in the 80s. States has taken vicodin before w/o problems   . Sulfa Drugs Cross Reactors Rash    Past Medical History:  Diagnosis Date  . Acid reflux   . Arthritis   . Blood transfusion without reported diagnosis   . Chronic headaches    on cymbalta  . Cirrhosis ([place])   . Colon polyps   . Complication of anesthesia    problems waking up from anesthesia  . Depression    on cymbalta  . Diabetes mellitus with neuropathy ([place])   . Diverticulitis 03/2013  . Eczema   . Elevated LFTs   . Fatty liver   . Hepatitis 10/2017   NASH cirrhosis  . History of kidney stones   . Hyperlipidemia   . Hypertension   . Insomnia 04/26/2013  . Kidney stone   . Morbid obesity ([place])   . Neuromuscular disorder ([facility])    neuropathy  . Neuropathy   . Post-traumatic hydrocephalus ([facility])    s/p shunts x 2 (first got infected )  . Psoriasis    sees Dr [name]  . Psoriatic arthritis ([place])   . 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 Sleep apnea    no  CPAP     . Stomach ulcer   . Testosterone deficiency 04/28/2011    Past Surgical History:  Procedure Laterality Date  . BACK SURGERY  1980  . BRAIN SURGERY     VP shunts placed in 2007  . CHOLECYSTECTOMY N/A 08/25/2017   Procedure: LAPAROSCOPIC CHOLECYSTECTOMY WITH INTRAOPERATIVE CHOLANGIOGRAM;  Surgeon: [name], MD;  Location: [facility];  Service: General;  Laterality: N/A;  . COLONOSCOPY    . JOINT REPLACEMENT     total hip  . LUMBAR FUSION  03/16/2019  . SHOULDER SURGERY Left 2010  . TOE SURGERY Left 2018  . TONSILLECTOMY     as a child  . TOTAL HIP ARTHROPLASTY Left 2011  . UPPER GASTROINTESTINAL ENDOSCOPY  01/04/2020  . VENTRICULOPERITONEAL SHUNT  2007   x2    Family History  Problem Relation Age of Onset   . Other Mother   . Lung cancer Father        alive, former smoker   . Heart disease Brother        MI age 46  . Other Brother        Murdered  . Down syndrome Son   . Diabetes Neg Hx   . Prostate cancer Neg Hx   . Colon cancer Neg Hx   . Stomach cancer Neg Hx   . Pancreatic cancer Neg Hx   . Liver disease Neg Hx     Social History:  reports that he has never smoked. He has never used smokeless tobacco. He reports that he does not drink alcohol and does not use drugs.    Review of Systems      HYPERTENSION: On carvedilol for this Hypertension managed by his PCP   BP Readings from Last 3 Encounters:  07/29/20 130/82  07/23/20 139/80  06/24/20 (!) 142/88   RENAL dysfunction: Has had variable creatinine levels but persistently mildly abnormal  Lab Results  Component Value Date   CREATININE 1.32 06/18/2020   CREATININE 1.46 04/25/2020   CREATININE 1.36 03/19/2020    Lipid history: He is on Lipitor  20 mg  Is on fenofibrate for triglyceride treatment, has increased triglycerides with results as below  LDL is below 100 now   Lab Results  Component Value Date   CHOL 171 06/11/2020   HDL 41.20 06/11/2020   LDLCALC 106 (H) 04/28/2019   LDLDIRECT 91.0 06/11/2020   TRIG 214.0 (H) 06/11/2020   CHOLHDL 4 06/11/2020           He is being followed by gastroenterology for liver cirrhosis secondary to [facility]  Lab Results  Component Value Date   ALT 32 06/11/2020     Most recent foot exam: 08/2017  He has symptomatic painful neuropathy and is on gabapentin 600 mg twice a day and  Cymbalta 60 mg Symptoms are relatively well controlled However he has had difficulties with sciatica-like pains  Has been followed by neurologist    Review of Systems    Physical Examination:  BP 130/82   Pulse 78   Ht 5' 10"  (1.778 m)   Wt 255 lb 3.2 oz (115.8 kg)   SpO2 92%   BMI 36.62 kg/m       ASSESSMENT:  Diabetes type 2 with obesity  See history of present  illness for discussion of current diabetes management, blood sugar patterns and problems identified  Currently on a regimen of insulin with the pump, Ozempic and Metformin 1 g twice daily  With restarting his  Ozempic his blood sugars are excellent as discussed above However he does not appear to be taking boluses much and not clear if he needs any since postprandial readings are better Also early morning readings appear to be low normal now  A1c is last relatively better at 7.3    PLAN:   We will reduce his basal rate 12 AM down to 0.0, this was done in the office Continue Ozempic 0.25 mg weekly Discussed that he may need at least 2 to 4 units to cover his meals when eating significant amount of carbohydrate especially in the evening Avoid regular soft drinks    Patient Instructions  Bolus if eating any hi carb meals or eating out      Elayne Snare 07/30/2020, 9:58 AM

## 2020-07-29 NOTE — Patient Instructions (Signed)
Bolus if eating any hi carb meals or eating out

## 2020-07-31 DIAGNOSIS — I10 Essential (primary) hypertension: Secondary | ICD-10-CM | POA: Diagnosis not present

## 2020-07-31 DIAGNOSIS — Z982 Presence of cerebrospinal fluid drainage device: Secondary | ICD-10-CM

## 2020-07-31 DIAGNOSIS — G911 Obstructive hydrocephalus: Secondary | ICD-10-CM | POA: Diagnosis not present

## 2020-07-31 DIAGNOSIS — Z6835 Body mass index (BMI) 35.0-35.9, adult: Secondary | ICD-10-CM | POA: Diagnosis not present

## 2020-07-31 HISTORY — DX: Presence of cerebrospinal fluid drainage device: Z98.2

## 2020-08-02 ENCOUNTER — Other Ambulatory Visit: Payer: Self-pay | Admitting: Endocrinology

## 2020-08-02 MED FILL — METFORMIN HCL 500 MG TABS: 500 | 30 days supply | Qty: 120 | Fill #0

## 2020-08-02 MED FILL — OZEMPIC 0.25 OR 0.5 MG/DOSE: 2 | 28 days supply | Qty: 2 | Fill #1

## 2020-08-02 MED FILL — FENOFIBRATE 134 MG CAPSULE: 134 | 28 days supply | Qty: 12 | Fill #1

## 2020-08-08 DIAGNOSIS — R079 Chest pain, unspecified: Secondary | ICD-10-CM | POA: Diagnosis not present

## 2020-08-08 DIAGNOSIS — L409 Psoriasis, unspecified: Secondary | ICD-10-CM | POA: Diagnosis not present

## 2020-08-09 LAB — BASIC METABOLIC PANEL
BUN/Creatinine Ratio: 14 (ref 10–24)
BUN: 19 mg/dL (ref 8–27)
CO2: 19 mmol/L — ABNORMAL LOW (ref 20–29)
Calcium: 9.6 mg/dL (ref 8.6–10.2)
Chloride: 105 mmol/L (ref 96–106)
Creatinine, Ser: 1.32 mg/dL — ABNORMAL HIGH (ref 0.76–1.27)
GFR calc Af Amer: 66 mL/min/{1.73_m2} (ref >59)
GFR calc non Af Amer: 57 mL/min/{1.73_m2} — ABNORMAL LOW (ref >59)
Glucose: 171 mg/dL — ABNORMAL HIGH (ref 65–99)
Potassium: 4.6 mmol/L (ref 3.5–5.2)
Sodium: 140 mmol/L (ref 134–144)

## 2020-08-12 ENCOUNTER — Telehealth (HOSPITAL_COMMUNITY): Payer: Self-pay | Admitting: *Deleted

## 2020-08-12 NOTE — Telephone Encounter (Signed)
Attempted to call patient regarding upcoming cardiac CT appointment. °Left message on voicemail with name and callback number ° °Edna Grover RN Navigator Cardiac Imaging °[facility] °[phone] Office °[phone] Cell ° °

## 2020-08-13 ENCOUNTER — Telehealth (HOSPITAL_COMMUNITY): Payer: Self-pay | Admitting: *Deleted

## 2020-08-13 NOTE — Telephone Encounter (Signed)
Pt returning call regarding upcoming cardiac imaging study; pt verbalizes understanding of appt date/time, parking situation and where to check in, pre-test NPO status and medications ordered, and verified current allergies; name and call back number provided for further questions should they arise  Gordy Clement RN Navigator Cardiac [place] [facility] [phone] office [phone] cell

## 2020-08-14 ENCOUNTER — Ambulatory Visit (HOSPITAL_COMMUNITY)
Admission: RE | Admit: 2020-08-14 | Discharge: 2020-08-14 | Disposition: A | Discharge status: Home / Self Care | Payer: 59 | Source: Ambulatory Visit | Attending: Cardiology | Admitting: Cardiology

## 2020-08-14 ENCOUNTER — Encounter (HOSPITAL_COMMUNITY): Payer: Self-pay

## 2020-08-14 ENCOUNTER — Other Ambulatory Visit: Payer: Self-pay

## 2020-08-14 DIAGNOSIS — R079 Chest pain, unspecified: Secondary | ICD-10-CM | POA: Insufficient documentation

## 2020-08-14 DIAGNOSIS — E1122 Type 2 diabetes mellitus with diabetic chronic kidney disease: Secondary | ICD-10-CM | POA: Diagnosis not present

## 2020-08-14 DIAGNOSIS — N182 Chronic kidney disease, stage 2 (mild): Secondary | ICD-10-CM | POA: Diagnosis not present

## 2020-08-14 DIAGNOSIS — R0789 Other chest pain: Secondary | ICD-10-CM | POA: Diagnosis not present

## 2020-08-14 DIAGNOSIS — I10 Essential (primary) hypertension: Secondary | ICD-10-CM | POA: Diagnosis not present

## 2020-08-14 DIAGNOSIS — K7581 Nonalcoholic steatohepatitis (NASH): Secondary | ICD-10-CM | POA: Insufficient documentation

## 2020-08-14 DIAGNOSIS — K7460 Unspecified cirrhosis of liver: Secondary | ICD-10-CM | POA: Diagnosis not present

## 2020-08-14 MED ORDER — METOPROLOL TARTRATE 5 MG/5ML IV SOLN
5.0000 mg | INTRAVENOUS | Status: DC | PRN
  Administered 2020-08-14: 5 mg via INTRAVENOUS

## 2020-08-14 MED ORDER — DILTIAZEM HCL 25 MG/5ML IV SOLN
10.0000 mg | Freq: Once | INTRAVENOUS | Status: AC
  Administered 2020-08-14: 10 mg via INTRAVENOUS
  Filled 2020-08-14: qty 5

## 2020-08-14 MED ORDER — NITROGLYCERIN 0.4 MG SL SUBL: 0.8000 mg | SUBLINGUAL_TABLET | Freq: Once | SUBLINGUAL | Status: DC

## 2020-08-14 MED ORDER — DILTIAZEM HCL 25 MG/5ML IV SOLN
INTRAVENOUS | Status: AC
  Filled 2020-08-14: qty 5

## 2020-08-14 MED ORDER — METOPROLOL TARTRATE 5 MG/5ML IV SOLN
INTRAVENOUS | Status: AC
  Administered 2020-08-14: 5 mg via INTRAVENOUS
  Filled 2020-08-14: qty 10

## 2020-08-14 NOTE — Progress Notes (Signed)
Patient here for a Coronary study to be read by Dr. Harrell Gave.  Patient given 36m Lopressor and 134mDiltiazem with a continued heart rate of 85-88.  Contacted CT Navigator SaClarise Cruzo reschedule patient with follow up to Dr. ChHarrell Gave Patient given a coke post medications with a rebounded pressure of 121/80.

## 2020-08-19 MED FILL — FREESTYLE LIBRE 14 DAY SENS: 28 days supply | Qty: 2 | Fill #3

## 2020-08-26 MED FILL — HUMIRA PEN 40 MG/0.8ML PNKT: 40 | 28 days supply | Qty: 2 | Fill #1

## 2020-08-27 ENCOUNTER — Other Ambulatory Visit: Payer: Self-pay | Admitting: Endocrinology

## 2020-08-27 MED FILL — clonazePAM 0.5 MG TABS: 0.5 | 30 days supply | Qty: 30 | Fill #1

## 2020-08-27 MED FILL — OMNIPOD DASH 5 PACK MISC: 30 days supply | Qty: 10 | Fill #0

## 2020-09-03 ENCOUNTER — Telehealth (HOSPITAL_COMMUNITY): Payer: Self-pay | Admitting: Emergency Medicine

## 2020-09-03 ENCOUNTER — Other Ambulatory Visit (HOSPITAL_COMMUNITY): Payer: Self-pay | Admitting: Emergency Medicine

## 2020-09-03 DIAGNOSIS — R079 Chest pain, unspecified: Secondary | ICD-10-CM

## 2020-09-03 MED ORDER — METOPROLOL TARTRATE 100 MG PO TABS: 100.0000 mg | ORAL_TABLET | Freq: Once | ORAL | 0 refills | Status: DC

## 2020-09-03 NOTE — Telephone Encounter (Signed)
Attempted to call patient regarding upcoming cardiac CT appointment. Left message on voicemail with name and callback number Marchia Bond RN Navigator Cardiac Imaging [facility] [phone] Office [phone] Cell  Detailed message about ordering one time dose 158m metoprolol to be taken 2 hr prior to scan (sent to [facility]) SClarise Cruz

## 2020-09-04 ENCOUNTER — Ambulatory Visit (HOSPITAL_COMMUNITY)
Admission: RE | Admit: 2020-09-04 | Discharge: 2020-09-04 | Disposition: A | Discharge status: Home / Self Care | Payer: 59 | Source: Ambulatory Visit | Attending: Cardiology | Admitting: Cardiology

## 2020-09-04 ENCOUNTER — Other Ambulatory Visit: Payer: Self-pay

## 2020-09-04 ENCOUNTER — Encounter (HOSPITAL_COMMUNITY): Payer: Self-pay

## 2020-09-04 DIAGNOSIS — N182 Chronic kidney disease, stage 2 (mild): Secondary | ICD-10-CM | POA: Insufficient documentation

## 2020-09-04 DIAGNOSIS — I129 Hypertensive chronic kidney disease with stage 1 through stage 4 chronic kidney disease, or unspecified chronic kidney disease: Secondary | ICD-10-CM | POA: Diagnosis not present

## 2020-09-04 DIAGNOSIS — R079 Chest pain, unspecified: Secondary | ICD-10-CM | POA: Insufficient documentation

## 2020-09-04 DIAGNOSIS — Z006 Encounter for examination for normal comparison and control in clinical research program: Secondary | ICD-10-CM

## 2020-09-04 DIAGNOSIS — E1122 Type 2 diabetes mellitus with diabetic chronic kidney disease: Secondary | ICD-10-CM | POA: Diagnosis not present

## 2020-09-04 DIAGNOSIS — R931 Abnormal findings on diagnostic imaging of heart and coronary circulation: Secondary | ICD-10-CM | POA: Diagnosis not present

## 2020-09-04 DIAGNOSIS — I2510 Atherosclerotic heart disease of native coronary artery without angina pectoris: Secondary | ICD-10-CM | POA: Insufficient documentation

## 2020-09-04 DIAGNOSIS — K7460 Unspecified cirrhosis of liver: Secondary | ICD-10-CM

## 2020-09-04 DIAGNOSIS — R0789 Other chest pain: Secondary | ICD-10-CM

## 2020-09-04 DIAGNOSIS — I10 Essential (primary) hypertension: Secondary | ICD-10-CM

## 2020-09-04 DIAGNOSIS — K7469 Other cirrhosis of liver: Secondary | ICD-10-CM

## 2020-09-04 DIAGNOSIS — I700 Atherosclerosis of aorta: Secondary | ICD-10-CM | POA: Insufficient documentation

## 2020-09-04 DIAGNOSIS — K7581 Nonalcoholic steatohepatitis (NASH): Secondary | ICD-10-CM

## 2020-09-04 MED ORDER — DILTIAZEM HCL 25 MG/5ML IV SOLN
INTRAVENOUS | Status: AC
  Administered 2020-09-04: 5 mg via INTRAVENOUS
  Filled 2020-09-04: qty 5

## 2020-09-04 MED ORDER — METOPROLOL TARTRATE 5 MG/5ML IV SOLN
INTRAVENOUS | Status: AC
  Administered 2020-09-04: 5 mg via INTRAVENOUS
  Filled 2020-09-04: qty 5

## 2020-09-04 MED ORDER — METOPROLOL TARTRATE 5 MG/5ML IV SOLN
5.0000 mg | INTRAVENOUS | Status: DC | PRN
  Administered 2020-09-04: 5 mg via INTRAVENOUS

## 2020-09-04 MED ORDER — DILTIAZEM HCL 25 MG/5ML IV SOLN
5.0000 mg | INTRAVENOUS | Status: DC | PRN
  Administered 2020-09-04: 5 mg via INTRAVENOUS

## 2020-09-04 MED ORDER — METOPROLOL TARTRATE 5 MG/5ML IV SOLN
INTRAVENOUS | Status: AC
  Filled 2020-09-04: qty 5

## 2020-09-04 MED ORDER — NITROGLYCERIN 0.4 MG SL SUBL
SUBLINGUAL_TABLET | SUBLINGUAL | Status: AC
  Filled 2020-09-04: qty 2

## 2020-09-04 MED ORDER — IOHEXOL 350 MG/ML SOLN
100.0000 mL | Freq: Once | INTRAVENOUS | Status: AC | PRN
  Administered 2020-09-04: 100 mL via INTRAVENOUS

## 2020-09-04 MED ORDER — NITROGLYCERIN 0.4 MG SL SUBL
0.8000 mg | SUBLINGUAL_TABLET | Freq: Once | SUBLINGUAL | Status: AC
  Administered 2020-09-04: 0.8 mg via SUBLINGUAL

## 2020-09-04 NOTE — Research (Signed)
IDENTIFY Informed Consent                  Subject Name: James [name]   Subject met inclusion and exclusion criteria.  The informed consent form, study requirements and expectations were reviewed with the subject and questions and concerns were addressed prior to the signing of the consent form.  The subject verbalized understanding of the trial requirements.  The subject agreed to participate in the IDENTIFY trial and signed the informed consent at 12:41PM on 09/04/20.  The informed consent was obtained prior to performance of any protocol-specific procedures for the subject.  A copy of the signed informed consent was given to the subject and a copy was placed in the subject's medical record.   Meade Maw, Naval architect

## 2020-09-05 DIAGNOSIS — I2510 Atherosclerotic heart disease of native coronary artery without angina pectoris: Secondary | ICD-10-CM | POA: Diagnosis not present

## 2020-09-06 ENCOUNTER — Telehealth: Payer: Self-pay

## 2020-09-06 NOTE — Telephone Encounter (Signed)
Patient notified of results, he is schedule on 03/21 at 4pm. I message Dr. Agustin Cree if needed to be seen sooner.

## 2020-09-11 ENCOUNTER — Other Ambulatory Visit: Payer: Self-pay | Admitting: Internal Medicine

## 2020-09-13 ENCOUNTER — Other Ambulatory Visit: Payer: Self-pay

## 2020-09-13 DIAGNOSIS — L409 Psoriasis, unspecified: Secondary | ICD-10-CM | POA: Insufficient documentation

## 2020-09-13 DIAGNOSIS — G8929 Other chronic pain: Secondary | ICD-10-CM | POA: Insufficient documentation

## 2020-09-13 DIAGNOSIS — K635 Polyp of colon: Secondary | ICD-10-CM | POA: Insufficient documentation

## 2020-09-13 DIAGNOSIS — G4730 Sleep apnea, unspecified: Secondary | ICD-10-CM | POA: Insufficient documentation

## 2020-09-13 DIAGNOSIS — R519 Headache, unspecified: Secondary | ICD-10-CM | POA: Insufficient documentation

## 2020-09-13 DIAGNOSIS — K259 Gastric ulcer, unspecified as acute or chronic, without hemorrhage or perforation: Secondary | ICD-10-CM | POA: Insufficient documentation

## 2020-09-13 DIAGNOSIS — K219 Gastro-esophageal reflux disease without esophagitis: Secondary | ICD-10-CM | POA: Insufficient documentation

## 2020-09-13 DIAGNOSIS — R7989 Other specified abnormal findings of blood chemistry: Secondary | ICD-10-CM | POA: Insufficient documentation

## 2020-09-13 DIAGNOSIS — E1140 Type 2 diabetes mellitus with diabetic neuropathy, unspecified: Secondary | ICD-10-CM | POA: Insufficient documentation

## 2020-09-13 DIAGNOSIS — G709 Myoneural disorder, unspecified: Secondary | ICD-10-CM | POA: Insufficient documentation

## 2020-09-13 DIAGNOSIS — E785 Hyperlipidemia, unspecified: Secondary | ICD-10-CM | POA: Insufficient documentation

## 2020-09-13 DIAGNOSIS — K760 Fatty (change of) liver, not elsewhere classified: Secondary | ICD-10-CM | POA: Insufficient documentation

## 2020-09-13 DIAGNOSIS — L309 Dermatitis, unspecified: Secondary | ICD-10-CM | POA: Insufficient documentation

## 2020-09-13 DIAGNOSIS — M1990 Unspecified osteoarthritis, unspecified site: Secondary | ICD-10-CM | POA: Insufficient documentation

## 2020-09-13 DIAGNOSIS — Z5189 Encounter for other specified aftercare: Secondary | ICD-10-CM | POA: Insufficient documentation

## 2020-09-13 DIAGNOSIS — K7460 Unspecified cirrhosis of liver: Secondary | ICD-10-CM | POA: Insufficient documentation

## 2020-09-13 DIAGNOSIS — N200 Calculus of kidney: Secondary | ICD-10-CM | POA: Insufficient documentation

## 2020-09-13 DIAGNOSIS — T8859XA Other complications of anesthesia, initial encounter: Secondary | ICD-10-CM | POA: Insufficient documentation

## 2020-09-13 DIAGNOSIS — G913 Post-traumatic hydrocephalus, unspecified: Secondary | ICD-10-CM | POA: Insufficient documentation

## 2020-09-13 DIAGNOSIS — Z87442 Personal history of urinary calculi: Secondary | ICD-10-CM | POA: Insufficient documentation

## 2020-09-13 DIAGNOSIS — G629 Polyneuropathy, unspecified: Secondary | ICD-10-CM | POA: Insufficient documentation

## 2020-09-16 ENCOUNTER — Encounter: Payer: Self-pay | Admitting: Cardiology

## 2020-09-16 ENCOUNTER — Ambulatory Visit (INDEPENDENT_AMBULATORY_CARE_PROVIDER_SITE_OTHER): Payer: 59 | Admitting: Cardiology

## 2020-09-16 ENCOUNTER — Other Ambulatory Visit: Payer: Self-pay | Admitting: Cardiology

## 2020-09-16 ENCOUNTER — Other Ambulatory Visit: Payer: Self-pay

## 2020-09-16 VITALS — BP 132/74 | HR 81 | Ht 71.0 in | Wt 225.0 lb

## 2020-09-16 DIAGNOSIS — I2510 Atherosclerotic heart disease of native coronary artery without angina pectoris: Secondary | ICD-10-CM

## 2020-09-16 DIAGNOSIS — R9439 Abnormal result of other cardiovascular function study: Secondary | ICD-10-CM | POA: Diagnosis not present

## 2020-09-16 DIAGNOSIS — K7581 Nonalcoholic steatohepatitis (NASH): Secondary | ICD-10-CM | POA: Diagnosis not present

## 2020-09-16 DIAGNOSIS — E782 Mixed hyperlipidemia: Secondary | ICD-10-CM

## 2020-09-16 DIAGNOSIS — R0789 Other chest pain: Secondary | ICD-10-CM | POA: Diagnosis not present

## 2020-09-16 MED ORDER — ISOSORBIDE MONONITRATE ER 30 MG PO TB24: 30.0000 mg | ORAL_TABLET | Freq: Every day | ORAL | 1 refills | Status: DC

## 2020-09-16 MED FILL — ISOSORBIDE MN ER 30 MG TAB: 30 | 90 days supply | Qty: 90 | Fill #0

## 2020-09-16 NOTE — H&P (View-Only) (Signed)
Cardiology Office Note:    Date:  09/16/2020   ID:  James [name], DOB 1956/02/11, MRN [id number]  PCP:  Colon Branch, MD  Cardiologist:  Jenne Campus, MD    Referring MD: Colon Branch, MD   Chief Complaint  Patient presents with  . Results    History of Present Illness:    James [name] is a 65 y.o. male with past medical history significant for diabetes, longstanding, essential hypertension, dyslipidemia, nonalcoholic cirrhosis of the liver.  I did see him last time sometimes in December when he requested to be seen because of atypical chest pain.  Initially sharp located on the left side of the chest then became kind of dull.  That chest pain was not associated with shortness of breath no sweating. Since the time we did the test he described to have repeated episode of chest pain sometimes happen with exercise sometimes happening at rest.  We did do fractional flow reserve which showed problem with midportion of right coronary artery as well as midportion of left anterior descending artery.  He comes today to talk about options for the situation.  He his wife is with him and she tells me that he is having more chest pain than before.  We talked about either medical therapy versus cardiac catheterization we discussed all problems and cause for every single approach.  He prefer in favor cardiac catheterization.  I explained procedure to him including all risk benefits as well as alternatives.  We will proceed.  Past Medical History:  Diagnosis Date  . Acid reflux   . Annual physical exam 04/08/2015  . Arthritis   . Blood transfusion without reported diagnosis   . Chronic fatigue 01/28/2015  . Chronic headaches    on cymbalta  . Chronic migraine w/o aura, not intractable, w/o stat migr 10/24/2018  . Cirrhosis ([place])   . Colon polyps   . Complication of anesthesia    problems waking up from anesthesia  . Depression    on cymbalta  . Diabetes mellitus with neuropathy ([place])   .  Diabetes with neuropathy 04/25/2013  . Diverticulitis 03/2013  . Dyslipidemia 05/29/2019  . Eczema   . Elevated LFTs   . Epidural lipomatosis 10/05/2018  . Essential (primary) hypertension 04/05/2019  . Fatty liver   . GERD (gastroesophageal reflux disease) 04/28/2011  . H/O craniotomy 05/07/2015  . Hepatitis 10/2017   NASH cirrhosis  . History of kidney stones   . Hyperlipidemia   . Hypertension   . IDA (iron deficiency anemia) 01/24/2019  . Insomnia 04/26/2013  . Kidney stone   . Liver cirrhosis secondary to NASH (nonalcoholic steatohepatitis) ([place]) 01/02/2016  . Low back pain 04/05/2019  . Lower back injury 08/14/2019  . Morbid obesity ([place])   . Neuromuscular disorder (HCC)    neuropathy  . Neuropathy   . Nonalcoholic steatohepatitis 03/05/2951  . Obstructive hydrocephalus ([place]) 01/28/2015  . OSA -- dx ~ 2012, cpap intolerant 09/04/2014    dx ~ 2012, cpap intolerant   . PCP NOTES >>> 04/08/2015  . Post-op pain 03/19/2019  . Post-traumatic hydrocephalus (HCC)    s/p shunts x 2 (first got infected )  . Presence of cerebrospinal fluid drainage device 07/28/2011  . Psoriasis    sees Dr [name]  . Psoriatic arthritis ([place])   . REM behavioral disorder 01/14/2017  . 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 SI (sacroiliac) joint dysfunction 08/14/2019  . Sigmoid  diverticulitis 04/25/2013  . Sleep apnea    no CPAP     . Spondylolisthesis, lumbar region 03/16/2019  . Stomach ulcer   . Testosterone deficiency 04/28/2011  . VP (ventriculoperitoneal) shunt status 07/31/2020    Past Surgical History:  Procedure Laterality Date  . BACK SURGERY  1980  . BRAIN SURGERY     VP shunts placed in 2007  . CHOLECYSTECTOMY N/A 08/25/2017   Procedure: LAPAROSCOPIC CHOLECYSTECTOMY WITH INTRAOPERATIVE CHOLANGIOGRAM;  Surgeon: [name], MD;  Location: [facility];  Service: General;  Laterality: N/A;  . COLONOSCOPY    . JOINT REPLACEMENT     total hip  . LUMBAR FUSION   03/16/2019  . SHOULDER SURGERY Left 2010  . TOE SURGERY Left 2018  . TONSILLECTOMY     as a child  . TOTAL HIP ARTHROPLASTY Left 2011  . UPPER GASTROINTESTINAL ENDOSCOPY  01/04/2020  . VENTRICULOPERITONEAL SHUNT  2007   x2    Current Medications: Current Meds  Medication Sig  . Adalimumab (HUMIRA PEN) 40 MG/0.8ML PNKT Inject 0.8 mls under the skin every other week.  Marland Kitchen atorvastatin (LIPITOR) 20 MG tablet Take 1 tablet (20 mg total) by mouth at bedtime.  . carvedilol (COREG) 12.5 MG tablet Take 1 tablet (12.5 mg total) by mouth 2 (two) times daily with a meal.  . clonazePAM (KLONOPIN) 0.5 MG tablet Take 1 tablet (0.5 mg total) by mouth at bedtime.  . Continuous Blood Gluc Sensor (FREESTYLE LIBRE 14 DAY SENSOR) MISC Apply topically as directed.  . DULoxetine (CYMBALTA) 60 MG capsule Take 2 capsules (120 mg total) by mouth daily.  Marland Kitchen EPINEPHrine (EPIPEN 2-PAK) 0.3 mg/0.3 mL IJ SOAJ injection Inject 0.3 mLs (0.3 mg total) into the muscle once as needed for up to 1 dose for anaphylaxis.  . fenofibrate micronized (LOFIBRA) 134 MG capsule TAKE 1 CAPSULE BY MOUTH THREE TIMES WEEKLY  . gabapentin (NEURONTIN) 600 MG tablet Take 1 tablet (600 mg total) by mouth 2 (two) times daily.  Marland Kitchen glimepiride (AMARYL) 4 MG tablet TAKE 1 TABLET BY MOUTH ONCE DAILY BEFORE BREAKFAST  . Insulin Disposable Pump (OMNIPOD DASH 5 PACK PODS) MISC CHANGE EVERY 72 HOURS AS DIRECTED  . insulin lispro (HUMALOG) 100 UNIT/ML injection 50 units daily in pump  . levETIRAcetam (KEPPRA) 750 MG tablet Take 1 tablet (750 mg total) by mouth 2 (two) times daily.  . metFORMIN (GLUCOPHAGE) 500 MG tablet TAKE 2 TABLETS BY MOUTH TWICE A DAY  . nitroGLYCERIN (NITROSTAT) 0.4 MG SL tablet Place 1 tablet (0.4 mg total) under the tongue every 5 (five) minutes as needed for chest pain.  Marland Kitchen omeprazole (PRILOSEC) 40 MG capsule Take 1 capsule (40 mg total) by mouth 2 (two) times daily before a meal.  . Semaglutide,0.25 or 0.5MG/DOS, (OZEMPIC, 0.25  OR 0.5 MG/DOSE,) 2 MG/1.5ML SOPN Inject 0.5 mg into the skin once a week.  . traZODone (DESYREL) 50 MG tablet Take 1 tablet (50 mg total) by mouth at bedtime.     Allergies:   Bee venom, Hydrocodone-homatropine, Toradol [ketorolac tromethamine], Hydrocodone, Morphine, Morphine and related, and Sulfa drugs cross reactors   Social History   Socioeconomic History  . Marital status: Married    Spouse name: [name]  . Number of children: 2  . Years of education: Not on file  . Highest education level: Not on file  Occupational History  . Occupation: disabled   Tobacco Use  . Smoking status: Never Smoker  . Smokeless tobacco: Never Used  Vaping Use  .  Vaping Use: Never used  Substance and Sexual Activity  . Alcohol use: No  . Drug use: No  . Sexual activity: Yes    Partners: Female  Other Topics Concern  . 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   Social Determinants of Health   Financial Resource Strain: Not on file  Food Insecurity: Not on file  Transportation Needs: Not on file  Physical Activity: Not on file  Stress: Not on file  Social Connections: Not on file     Family History: The patient's family history includes Down syndrome in his son; Heart disease in his brother; Lung cancer in his father; Other in his brother and mother. There is no history of Diabetes, Prostate cancer, Colon cancer, Stomach cancer, Pancreatic cancer, or Liver disease. ROS:   Please see the history of present illness.    All 14 point review of systems negative except as described per history of present illness  EKGs/Labs/Other Studies Reviewed:      Recent Labs: 03/12/2020: Hemoglobin 14.4; Platelets 159.0 06/11/2020: ALT 32; TSH 1.57 08/08/2020: BUN 19; Creatinine, Ser 1.32; Potassium 4.6; Sodium 140  Recent Lipid Panel     Component Value Date/Time   CHOL 171 06/11/2020 1030   TRIG 214.0 (H) 06/11/2020 1030   HDL 41.20 06/11/2020 1030   CHOLHDL 4 06/11/2020 1030   VLDL 42.8 (H) 06/11/2020 1030   LDLCALC 106 (H) 04/28/2019 1000   LDLDIRECT 91.0 06/11/2020 1030    Physical Exam:    VS:  BP 132/74 (BP Location: Left Arm, Patient Position: Sitting)   Pulse 81   Ht 5' 11"  (1.803 m)   Wt 225 lb (102.1 kg)   SpO2 96%   BMI 31.38 kg/m     Wt Readings from Last 3 Encounters:  09/16/20 225 lb (102.1 kg)  07/29/20 255 lb 3.2 oz (115.8 kg)  07/23/20 253 lb 8 oz (115 kg)     GEN:  Well nourished, well developed in no acute distress HEENT: Normal NECK: No JVD; No carotid bruits LYMPHATICS: No lymphadenopathy CARDIAC: RRR, no murmurs, no rubs, no gallops RESPIRATORY:  Clear to auscultation without rales, wheezing or rhonchi  ABDOMEN: Soft, non-tender, non-distended MUSCULOSKELETAL:  No edema; No deformity  SKIN: Warm and dry LOWER EXTREMITIES: no swelling NEUROLOGIC:  Alert and oriented x 3 PSYCHIATRIC:  Normal affect   ASSESSMENT:    1. Coronary artery disease involving native coronary artery of native heart without angina pectoris   2. Abnormal stress test   3. Nonalcoholic steatohepatitis   4. Mixed hyperlipidemia   5. Atypical chest pain    PLAN:    In order of problems listed above:  1. Coronary disease fractional flow reserve showing problem with midportion of the right coronary artery with GFR of 0.74.  There is also significant stenosis in the midportion of the left anterior descending artery with 0.53.  Again plan is to proceed with cardiac catheterization.  In the meantime I will put him on Imdur 30 mg daily.  He is already on beta-blocker as well as statin and I will continue. 2. Mixed dyslipidemia: Last fasting lipid profile showing LDL of 67 HDL 39.  Crestor 5 mg is already given to the patient which I will continue. 3. Diabetes is being followed by entire medicine team. 4. Liver  steatosis.  Noted.   Medication Adjustments/Labs and Tests Ordered: Current medicines are reviewed at length with the patient today.  Concerns regarding medicines are outlined above.  Orders Placed This Encounter  Procedures  . EKG 12-Lead   Medication changes: No orders of the defined types were placed in this encounter.   Signed, Park Liter, MD, [facility] 09/16/2020 5:00 PM    [place]

## 2020-09-16 NOTE — Progress Notes (Signed)
ekg 

## 2020-09-16 NOTE — Progress Notes (Signed)
Cardiology Office Note:    Date:  09/16/2020   ID:  James [name], DOB 02-07-1956, MRN [id number]  PCP:  Colon Branch, MD  Cardiologist:  Jenne Campus, MD    Referring MD: Colon Branch, MD   Chief Complaint  Patient presents with  . Results    History of Present Illness:    James [name] is a 66 y.o. male with past medical history significant for diabetes, longstanding, essential hypertension, dyslipidemia, nonalcoholic cirrhosis of the liver.  I did see him last time sometimes in December when he requested to be seen because of atypical chest pain.  Initially sharp located on the left side of the chest then became kind of dull.  That chest pain was not associated with shortness of breath no sweating. Since the time we did the test he described to have repeated episode of chest pain sometimes happen with exercise sometimes happening at rest.  We did do fractional flow reserve which showed problem with midportion of right coronary artery as well as midportion of left anterior descending artery.  He comes today to talk about options for the situation.  He his wife is with him and she tells me that he is having more chest pain than before.  We talked about either medical therapy versus cardiac catheterization we discussed all problems and cause for every single approach.  He prefer in favor cardiac catheterization.  I explained procedure to him including all risk benefits as well as alternatives.  We will proceed.  Past Medical History:  Diagnosis Date  . Acid reflux   . Annual physical exam 04/08/2015  . Arthritis   . Blood transfusion without reported diagnosis   . Chronic fatigue 01/28/2015  . Chronic headaches    on cymbalta  . Chronic migraine w/o aura, not intractable, w/o stat migr 10/24/2018  . Cirrhosis ([place])   . Colon polyps   . Complication of anesthesia    problems waking up from anesthesia  . Depression    on cymbalta  . Diabetes mellitus with neuropathy ([place])   .  Diabetes with neuropathy 04/25/2013  . Diverticulitis 03/2013  . Dyslipidemia 05/29/2019  . Eczema   . Elevated LFTs   . Epidural lipomatosis 10/05/2018  . Essential (primary) hypertension 04/05/2019  . Fatty liver   . GERD (gastroesophageal reflux disease) 04/28/2011  . H/O craniotomy 05/07/2015  . Hepatitis 10/2017   NASH cirrhosis  . History of kidney stones   . Hyperlipidemia   . Hypertension   . IDA (iron deficiency anemia) 01/24/2019  . Insomnia 04/26/2013  . Kidney stone   . Liver cirrhosis secondary to NASH (nonalcoholic steatohepatitis) ([place]) 01/02/2016  . Low back pain 04/05/2019  . Lower back injury 08/14/2019  . Morbid obesity ([place])   . Neuromuscular disorder (HCC)    neuropathy  . Neuropathy   . Nonalcoholic steatohepatitis 08/02/5571  . Obstructive hydrocephalus ([place]) 01/28/2015  . OSA -- dx ~ 2012, cpap intolerant 09/04/2014    dx ~ 2012, cpap intolerant   . PCP NOTES >>> 04/08/2015  . Post-op pain 03/19/2019  . Post-traumatic hydrocephalus (HCC)    s/p shunts x 2 (first got infected )  . Presence of cerebrospinal fluid drainage device 07/28/2011  . Psoriasis    sees Dr [name]  . Psoriatic arthritis ([place])   . REM behavioral disorder 01/14/2017  . 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 SI (sacroiliac) joint dysfunction 08/14/2019  . Sigmoid  diverticulitis 04/25/2013  . Sleep apnea    no CPAP     . Spondylolisthesis, lumbar region 03/16/2019  . Stomach ulcer   . Testosterone deficiency 04/28/2011  . VP (ventriculoperitoneal) shunt status 07/31/2020    Past Surgical History:  Procedure Laterality Date  . BACK SURGERY  1980  . BRAIN SURGERY     VP shunts placed in 2007  . CHOLECYSTECTOMY N/A 08/25/2017   Procedure: LAPAROSCOPIC CHOLECYSTECTOMY WITH INTRAOPERATIVE CHOLANGIOGRAM;  Surgeon: [name], MD;  Location: [facility];  Service: General;  Laterality: N/A;  . COLONOSCOPY    . JOINT REPLACEMENT     total hip  . LUMBAR FUSION   03/16/2019  . SHOULDER SURGERY Left 2010  . TOE SURGERY Left 2018  . TONSILLECTOMY     as a child  . TOTAL HIP ARTHROPLASTY Left 2011  . UPPER GASTROINTESTINAL ENDOSCOPY  01/04/2020  . VENTRICULOPERITONEAL SHUNT  2007   x2    Current Medications: Current Meds  Medication Sig  . Adalimumab (HUMIRA PEN) 40 MG/0.8ML PNKT Inject 0.8 mls under the skin every other week.  Marland Kitchen atorvastatin (LIPITOR) 20 MG tablet Take 1 tablet (20 mg total) by mouth at bedtime.  . carvedilol (COREG) 12.5 MG tablet Take 1 tablet (12.5 mg total) by mouth 2 (two) times daily with a meal.  . clonazePAM (KLONOPIN) 0.5 MG tablet Take 1 tablet (0.5 mg total) by mouth at bedtime.  . Continuous Blood Gluc Sensor (FREESTYLE LIBRE 14 DAY SENSOR) MISC Apply topically as directed.  . DULoxetine (CYMBALTA) 60 MG capsule Take 2 capsules (120 mg total) by mouth daily.  Marland Kitchen EPINEPHrine (EPIPEN 2-PAK) 0.3 mg/0.3 mL IJ SOAJ injection Inject 0.3 mLs (0.3 mg total) into the muscle once as needed for up to 1 dose for anaphylaxis.  . fenofibrate micronized (LOFIBRA) 134 MG capsule TAKE 1 CAPSULE BY MOUTH THREE TIMES WEEKLY  . gabapentin (NEURONTIN) 600 MG tablet Take 1 tablet (600 mg total) by mouth 2 (two) times daily.  Marland Kitchen glimepiride (AMARYL) 4 MG tablet TAKE 1 TABLET BY MOUTH ONCE DAILY BEFORE BREAKFAST  . Insulin Disposable Pump (OMNIPOD DASH 5 PACK PODS) MISC CHANGE EVERY 72 HOURS AS DIRECTED  . insulin lispro (HUMALOG) 100 UNIT/ML injection 50 units daily in pump  . levETIRAcetam (KEPPRA) 750 MG tablet Take 1 tablet (750 mg total) by mouth 2 (two) times daily.  . metFORMIN (GLUCOPHAGE) 500 MG tablet TAKE 2 TABLETS BY MOUTH TWICE A DAY  . nitroGLYCERIN (NITROSTAT) 0.4 MG SL tablet Place 1 tablet (0.4 mg total) under the tongue every 5 (five) minutes as needed for chest pain.  Marland Kitchen omeprazole (PRILOSEC) 40 MG capsule Take 1 capsule (40 mg total) by mouth 2 (two) times daily before a meal.  . Semaglutide,0.25 or 0.5MG/DOS, (OZEMPIC, 0.25  OR 0.5 MG/DOSE,) 2 MG/1.5ML SOPN Inject 0.5 mg into the skin once a week.  . traZODone (DESYREL) 50 MG tablet Take 1 tablet (50 mg total) by mouth at bedtime.     Allergies:   Bee venom, Hydrocodone-homatropine, Toradol [ketorolac tromethamine], Hydrocodone, Morphine, Morphine and related, and Sulfa drugs cross reactors   Social History   Socioeconomic History  . Marital status: Married    Spouse name: [name]  . Number of children: 2  . Years of education: Not on file  . Highest education level: Not on file  Occupational History  . Occupation: disabled   Tobacco Use  . Smoking status: Never Smoker  . Smokeless tobacco: Never Used  Vaping Use  .  Vaping Use: Never used  Substance and Sexual Activity  . Alcohol use: No  . Drug use: No  . Sexual activity: Yes    Partners: Female  Other Topics Concern  . 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   Social Determinants of Health   Financial Resource Strain: Not on file  Food Insecurity: Not on file  Transportation Needs: Not on file  Physical Activity: Not on file  Stress: Not on file  Social Connections: Not on file     Family History: The patient's family history includes Down syndrome in his son; Heart disease in his brother; Lung cancer in his father; Other in his brother and mother. There is no history of Diabetes, Prostate cancer, Colon cancer, Stomach cancer, Pancreatic cancer, or Liver disease. ROS:   Please see the history of present illness.    All 14 point review of systems negative except as described per history of present illness  EKGs/Labs/Other Studies Reviewed:      Recent Labs: 03/12/2020: Hemoglobin 14.4; Platelets 159.0 06/11/2020: ALT 32; TSH 1.57 08/08/2020: BUN 19; Creatinine, Ser 1.32; Potassium 4.6; Sodium 140  Recent Lipid Panel     Component Value Date/Time   CHOL 171 06/11/2020 1030   TRIG 214.0 (H) 06/11/2020 1030   HDL 41.20 06/11/2020 1030   CHOLHDL 4 06/11/2020 1030   VLDL 42.8 (H) 06/11/2020 1030   LDLCALC 106 (H) 04/28/2019 1000   LDLDIRECT 91.0 06/11/2020 1030    Physical Exam:    VS:  BP 132/74 (BP Location: Left Arm, Patient Position: Sitting)   Pulse 81   Ht 5' 11"  (1.803 m)   Wt 225 lb (102.1 kg)   SpO2 96%   BMI 31.38 kg/m     Wt Readings from Last 3 Encounters:  09/16/20 225 lb (102.1 kg)  07/29/20 255 lb 3.2 oz (115.8 kg)  07/23/20 253 lb 8 oz (115 kg)     GEN:  Well nourished, well developed in no acute distress HEENT: Normal NECK: No JVD; No carotid bruits LYMPHATICS: No lymphadenopathy CARDIAC: RRR, no murmurs, no rubs, no gallops RESPIRATORY:  Clear to auscultation without rales, wheezing or rhonchi  ABDOMEN: Soft, non-tender, non-distended MUSCULOSKELETAL:  No edema; No deformity  SKIN: Warm and dry LOWER EXTREMITIES: no swelling NEUROLOGIC:  Alert and oriented x 3 PSYCHIATRIC:  Normal affect   ASSESSMENT:    1. Coronary artery disease involving native coronary artery of native heart without angina pectoris   2. Abnormal stress test   3. Nonalcoholic steatohepatitis   4. Mixed hyperlipidemia   5. Atypical chest pain    PLAN:    In order of problems listed above:  1. Coronary disease fractional flow reserve showing problem with midportion of the right coronary artery with GFR of 0.74.  There is also significant stenosis in the midportion of the left anterior descending artery with 0.53.  Again plan is to proceed with cardiac catheterization.  In the meantime I will put him on Imdur 30 mg daily.  He is already on beta-blocker as well as statin and I will continue. 2. Mixed dyslipidemia: Last fasting lipid profile showing LDL of 67 HDL 39.  Crestor 5 mg is already given to the patient which I will continue. 3. Diabetes is being followed by entire medicine team. 4. Liver  steatosis.  Noted.   Medication Adjustments/Labs and Tests Ordered: Current medicines are reviewed at length with the patient today.  Concerns regarding medicines are outlined above.  Orders Placed This Encounter  Procedures  . EKG 12-Lead   Medication changes: No orders of the defined types were placed in this encounter.   Signed, Park Liter, MD, [facility] 09/16/2020 5:00 PM    [place]

## 2020-09-16 NOTE — H&P (View-Only) (Signed)
Cardiology Office Note:    Date:  09/16/2020   ID:  James [name], DOB 01/31/1956, MRN [id number]  PCP:  Colon Branch, MD  Cardiologist:  Jenne Campus, MD    Referring MD: Colon Branch, MD   Chief Complaint  Patient presents with  . Results    History of Present Illness:    James [name] is a 65 y.o. male with past medical history significant for diabetes, longstanding, essential hypertension, dyslipidemia, nonalcoholic cirrhosis of the liver.  I did see him last time sometimes in December when he requested to be seen because of atypical chest pain.  Initially sharp located on the left side of the chest then became kind of dull.  That chest pain was not associated with shortness of breath no sweating. Since the time we did the test he described to have repeated episode of chest pain sometimes happen with exercise sometimes happening at rest.  We did do fractional flow reserve which showed problem with midportion of right coronary artery as well as midportion of left anterior descending artery.  He comes today to talk about options for the situation.  He his wife is with him and she tells me that he is having more chest pain than before.  We talked about either medical therapy versus cardiac catheterization we discussed all problems and cause for every single approach.  He prefer in favor cardiac catheterization.  I explained procedure to him including all risk benefits as well as alternatives.  We will proceed.  Past Medical History:  Diagnosis Date  . Acid reflux   . Annual physical exam 04/08/2015  . Arthritis   . Blood transfusion without reported diagnosis   . Chronic fatigue 01/28/2015  . Chronic headaches    on cymbalta  . Chronic migraine w/o aura, not intractable, w/o stat migr 10/24/2018  . Cirrhosis ([place])   . Colon polyps   . Complication of anesthesia    problems waking up from anesthesia  . Depression    on cymbalta  . Diabetes mellitus with neuropathy ([place])   .  Diabetes with neuropathy 04/25/2013  . Diverticulitis 03/2013  . Dyslipidemia 05/29/2019  . Eczema   . Elevated LFTs   . Epidural lipomatosis 10/05/2018  . Essential (primary) hypertension 04/05/2019  . Fatty liver   . GERD (gastroesophageal reflux disease) 04/28/2011  . H/O craniotomy 05/07/2015  . Hepatitis 10/2017   NASH cirrhosis  . History of kidney stones   . Hyperlipidemia   . Hypertension   . IDA (iron deficiency anemia) 01/24/2019  . Insomnia 04/26/2013  . Kidney stone   . Liver cirrhosis secondary to NASH (nonalcoholic steatohepatitis) ([place]) 01/02/2016  . Low back pain 04/05/2019  . Lower back injury 08/14/2019  . Morbid obesity ([place])   . Neuromuscular disorder (HCC)    neuropathy  . Neuropathy   . Nonalcoholic steatohepatitis 5/0/2774  . Obstructive hydrocephalus ([place]) 01/28/2015  . OSA -- dx ~ 2012, cpap intolerant 09/04/2014    dx ~ 2012, cpap intolerant   . PCP NOTES >>> 04/08/2015  . Post-op pain 03/19/2019  . Post-traumatic hydrocephalus (HCC)    s/p shunts x 2 (first got infected )  . Presence of cerebrospinal fluid drainage device 07/28/2011  . Psoriasis    sees Dr [name]  . Psoriatic arthritis ([place])   . REM behavioral disorder 01/14/2017  . 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 SI (sacroiliac) joint dysfunction 08/14/2019  . Sigmoid  diverticulitis 04/25/2013  . Sleep apnea    no CPAP     . Spondylolisthesis, lumbar region 03/16/2019  . Stomach ulcer   . Testosterone deficiency 04/28/2011  . VP (ventriculoperitoneal) shunt status 07/31/2020    Past Surgical History:  Procedure Laterality Date  . BACK SURGERY  1980  . BRAIN SURGERY     VP shunts placed in 2007  . CHOLECYSTECTOMY N/A 08/25/2017   Procedure: LAPAROSCOPIC CHOLECYSTECTOMY WITH INTRAOPERATIVE CHOLANGIOGRAM;  Surgeon: [name], MD;  Location: [facility];  Service: General;  Laterality: N/A;  . COLONOSCOPY    . JOINT REPLACEMENT     total hip  . LUMBAR FUSION   03/16/2019  . SHOULDER SURGERY Left 2010  . TOE SURGERY Left 2018  . TONSILLECTOMY     as a child  . TOTAL HIP ARTHROPLASTY Left 2011  . UPPER GASTROINTESTINAL ENDOSCOPY  01/04/2020  . VENTRICULOPERITONEAL SHUNT  2007   x2    Current Medications: Current Meds  Medication Sig  . Adalimumab (HUMIRA PEN) 40 MG/0.8ML PNKT Inject 0.8 mls under the skin every other week.  Marland Kitchen atorvastatin (LIPITOR) 20 MG tablet Take 1 tablet (20 mg total) by mouth at bedtime.  . carvedilol (COREG) 12.5 MG tablet Take 1 tablet (12.5 mg total) by mouth 2 (two) times daily with a meal.  . clonazePAM (KLONOPIN) 0.5 MG tablet Take 1 tablet (0.5 mg total) by mouth at bedtime.  . Continuous Blood Gluc Sensor (FREESTYLE LIBRE 14 DAY SENSOR) MISC Apply topically as directed.  . DULoxetine (CYMBALTA) 60 MG capsule Take 2 capsules (120 mg total) by mouth daily.  Marland Kitchen EPINEPHrine (EPIPEN 2-PAK) 0.3 mg/0.3 mL IJ SOAJ injection Inject 0.3 mLs (0.3 mg total) into the muscle once as needed for up to 1 dose for anaphylaxis.  . fenofibrate micronized (LOFIBRA) 134 MG capsule TAKE 1 CAPSULE BY MOUTH THREE TIMES WEEKLY  . gabapentin (NEURONTIN) 600 MG tablet Take 1 tablet (600 mg total) by mouth 2 (two) times daily.  Marland Kitchen glimepiride (AMARYL) 4 MG tablet TAKE 1 TABLET BY MOUTH ONCE DAILY BEFORE BREAKFAST  . Insulin Disposable Pump (OMNIPOD DASH 5 PACK PODS) MISC CHANGE EVERY 72 HOURS AS DIRECTED  . insulin lispro (HUMALOG) 100 UNIT/ML injection 50 units daily in pump  . levETIRAcetam (KEPPRA) 750 MG tablet Take 1 tablet (750 mg total) by mouth 2 (two) times daily.  . metFORMIN (GLUCOPHAGE) 500 MG tablet TAKE 2 TABLETS BY MOUTH TWICE A DAY  . nitroGLYCERIN (NITROSTAT) 0.4 MG SL tablet Place 1 tablet (0.4 mg total) under the tongue every 5 (five) minutes as needed for chest pain.  Marland Kitchen omeprazole (PRILOSEC) 40 MG capsule Take 1 capsule (40 mg total) by mouth 2 (two) times daily before a meal.  . Semaglutide,0.25 or 0.5MG/DOS, (OZEMPIC, 0.25  OR 0.5 MG/DOSE,) 2 MG/1.5ML SOPN Inject 0.5 mg into the skin once a week.  . traZODone (DESYREL) 50 MG tablet Take 1 tablet (50 mg total) by mouth at bedtime.     Allergies:   Bee venom, Hydrocodone-homatropine, Toradol [ketorolac tromethamine], Hydrocodone, Morphine, Morphine and related, and Sulfa drugs cross reactors   Social History   Socioeconomic History  . Marital status: Married    Spouse name: James [name]  . Number of children: 2  . Years of education: Not on file  . Highest education level: Not on file  Occupational History  . Occupation: disabled   Tobacco Use  . Smoking status: Never Smoker  . Smokeless tobacco: Never Used  Vaping Use  .  Vaping Use: Never used  Substance and Sexual Activity  . Alcohol use: No  . Drug use: No  . Sexual activity: Yes    Partners: Female  Other Topics Concern  . 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   Social Determinants of Health   Financial Resource Strain: Not on file  Food Insecurity: Not on file  Transportation Needs: Not on file  Physical Activity: Not on file  Stress: Not on file  Social Connections: Not on file     Family History: The patient's family history includes Down syndrome in his son; Heart disease in his brother; Lung cancer in his father; Other in his brother and mother. There is no history of Diabetes, Prostate cancer, Colon cancer, Stomach cancer, Pancreatic cancer, or Liver disease. ROS:   Please see the history of present illness.    All 14 point review of systems negative except as described per history of present illness  EKGs/Labs/Other Studies Reviewed:      Recent Labs: 03/12/2020: Hemoglobin 14.4; Platelets 159.0 06/11/2020: ALT 32; TSH 1.57 08/08/2020: BUN 19; Creatinine, Ser 1.32; Potassium 4.6; Sodium 140  Recent Lipid Panel     Component Value Date/Time   CHOL 171 06/11/2020 1030   TRIG 214.0 (H) 06/11/2020 1030   HDL 41.20 06/11/2020 1030   CHOLHDL 4 06/11/2020 1030   VLDL 42.8 (H) 06/11/2020 1030   LDLCALC 106 (H) 04/28/2019 1000   LDLDIRECT 91.0 06/11/2020 1030    Physical Exam:    VS:  BP 132/74 (BP Location: Left Arm, Patient Position: Sitting)   Pulse 81   Ht 5' 11"  (1.803 m)   Wt 225 lb (102.1 kg)   SpO2 96%   BMI 31.38 kg/m     Wt Readings from Last 3 Encounters:  09/16/20 225 lb (102.1 kg)  07/29/20 255 lb 3.2 oz (115.8 kg)  07/23/20 253 lb 8 oz (115 kg)     GEN:  Well nourished, well developed in no acute distress HEENT: Normal NECK: No JVD; No carotid bruits LYMPHATICS: No lymphadenopathy CARDIAC: RRR, no murmurs, no rubs, no gallops RESPIRATORY:  Clear to auscultation without rales, wheezing or rhonchi  ABDOMEN: Soft, non-tender, non-distended MUSCULOSKELETAL:  No edema; No deformity  SKIN: Warm and dry LOWER EXTREMITIES: no swelling NEUROLOGIC:  Alert and oriented x 3 PSYCHIATRIC:  Normal affect   ASSESSMENT:    1. Coronary artery disease involving native coronary artery of native heart without angina pectoris   2. Abnormal stress test   3. Nonalcoholic steatohepatitis   4. Mixed hyperlipidemia   5. Atypical chest pain    PLAN:    In order of problems listed above:  1. Coronary disease fractional flow reserve showing problem with midportion of the right coronary artery with GFR of 0.74.  There is also significant stenosis in the midportion of the left anterior descending artery with 0.53.  Again plan is to proceed with cardiac catheterization.  In the meantime I will put him on Imdur 30 mg daily.  He is already on beta-blocker as well as statin and I will continue. 2. Mixed dyslipidemia: Last fasting lipid profile showing LDL of 67 HDL 39.  Crestor 5 mg is already given to the patient which I will continue. 3. Diabetes is being followed by entire medicine team. 4. Liver  steatosis.  Noted.   Medication Adjustments/Labs and Tests Ordered: Current medicines are reviewed at length with the patient today.  Concerns regarding medicines are outlined above.  Orders Placed This Encounter  Procedures  . EKG 12-Lead   Medication changes: No orders of the defined types were placed in this encounter.   Signed, Park Liter, MD, [facility] 09/16/2020 5:00 PM    [place]

## 2020-09-16 NOTE — Patient Instructions (Signed)
Medication Instructions:  Your physician has recommended you make the following change in your medication:  START: imdur 30 mg daily  *If you need a refill on your cardiac medications before your next appointment, please call your pharmacy*   Lab Work: Your physician recommends that you return for lab work today: bmp, cbc  If you have labs (blood work) drawn today and your tests are completely normal, you will receive your results only by: Marland Kitchen MyChart Message (if you have MyChart) OR . A paper copy in the mail If you have any lab test that is abnormal or we need to change your treatment, we will call you to review the results.   Testing/Procedures: A chest x-ray takes a picture of the organs and structures inside the chest, including the heart, lungs, and blood vessels. This test can show several things, including, whether the heart is enlarges; whether fluid is building up in the lungs; and whether pacemaker / defibrillator leads are still in place.     [place] [facility] [place] [address] Dept: [phone] Loc: [phone]  James [name]  09/16/2020  You are scheduled for a Cardiac Catheterization on Tuesday, March 29 with Dr. Shelva Majestic.  1. Please arrive at the [facility] (Main Entrance A) at [facility]: [address] [place], [place] [address] at 5:30 AM (This time is two hours before your procedure to ensure your preparation). Free valet parking service is available.   Special note: Every effort is made to have your procedure done on time. Please understand that emergencies sometimes delay scheduled procedures.  2. Diet: Do not eat solid foods after midnight.  The patient may have clear liquids until 5am upon the day of the procedure.  3. Labs: You will need to have blood drawn today.  4. Medication instructions in preparation for your procedure:   Contrast Allergy:  No    HOLD: Glimepiride, humalog, and ozempic the morning of the procedure.    Do not take Diabetes Med Glucophage (Metformin) on the day of the procedure and HOLD 48 HOURS AFTER THE PROCEDURE.  On the morning of your procedure, take your Aspirin and any morning medicines NOT listed above.  You may use sips of water.  5. Plan for one night stay--bring personal belongings. 6. Bring a current list of your medications and current insurance cards. 7. You MUST have a responsible person to drive you home. 8. Someone MUST be with you the first 24 hours after you arrive home or your discharge will be delayed. 9. Please wear clothes that are easy to get on and off and wear slip-on shoes.  Thank you for allowing [place] to care for you!   -- [place] [facility]    Follow-Up: At [facility], you and your health needs are our priority.  As part of our continuing mission to provide you with exceptional heart care, we have created designated Provider Care Teams.  These Care Teams include your primary Cardiologist (physician) and Advanced Practice Providers (APPs -  Physician Assistants and Nurse Practitioners) who all work together to provide you with the care you need, when you need it.  We recommend signing up for the patient portal called "MyChart".  Sign up information is provided on this After Visit Summary.  MyChart is used to connect with patients for Virtual Visits (Telemedicine).  Patients are able to view lab/test results, encounter notes, upcoming appointments,  etc.  Non-urgent messages can be sent to your provider as well.   To learn more about what you can do with MyChart, go to [email or web address].    Your next appointment:   6 week(s)  The format for your next appointment:   In Person  Provider:   Jenne Campus, MD   Other Instructions   Coronary Angiogram With Stent Coronary angiogram with stent placement is a procedure to widen or open a  narrow blood vessel of the heart (coronary artery). Arteries may become blocked by cholesterol buildup (plaques) in the lining of the artery wall. When a coronary artery becomes partially blocked, blood flow to that area decreases. This may lead to chest pain or a heart attack (myocardial infarction). A stent is a small piece of metal that looks like mesh or spring. Stent placement may be done as treatment after a heart attack, or to prevent a heart attack if a blocked artery is found by a coronary angiogram. Let your health care provider know about:  Any allergies you have, including allergies to medicines or contrast dye.  All medicines you are taking, including vitamins, herbs, eye drops, creams, and over-the-counter medicines.  Any problems you or family members have had with anesthetic medicines.  Any blood disorders you have.  Any surgeries you have had.  Any medical conditions you have, including kidney problems or kidney failure.  Whether you are pregnant or may be pregnant.  Whether you are breastfeeding. What are the risks? Generally, this is a safe procedure. However, serious problems may occur, including:  Damage to nearby structures or organs, such as the heart, blood vessels, or kidneys.  A return of blockage.  Bleeding, infection, or bruising at the insertion site.  A collection of blood under the skin (hematoma) at the insertion site.  A blood clot in another part of the body.  Allergic reaction to medicines or dyes.  Bleeding into the abdomen (retroperitoneal bleeding).  Stroke (rare).  Heart attack (rare). What happens before the procedure? Staying hydrated Follow instructions from your health care provider about hydration, which may include:  Up to 2 hours before the procedure - you may continue to drink clear liquids, such as water, clear fruit juice, black coffee, and plain tea.   Eating and drinking restrictions Follow instructions from your health  care provider about eating and drinking, which may include:  8 hours before the procedure - stop eating heavy meals or foods, such as meat, fried foods, or fatty foods.  6 hours before the procedure - stop eating light meals or foods, such as toast or cereal.  2 hours before the procedure - stop drinking clear liquids. Medicines Ask your health care provider about:  Changing or stopping your regular medicines. This is especially important if you are taking diabetes medicines or blood thinners.  Taking medicines such as aspirin and ibuprofen. These medicines can thin your blood. Do not take these medicines unless your health care provider tells you to take them. ? Generally, aspirin is recommended before a thin tube, called a catheter, is passed through a blood vessel and inserted into the heart (cardiac catheterization).  Taking over-the-counter medicines, vitamins, herbs, and supplements. General instructions  Do not use any products that contain nicotine or tobacco for at least 4 weeks before the procedure. These products include cigarettes, e-cigarettes, and chewing tobacco. If you need help quitting, ask your health care provider.  Plan to have someone take you home from the hospital or clinic.  If you will be going home right after the procedure, plan to have someone with you for 24 hours.  You may have tests and imaging procedures.  Ask your health care provider: ? How your insertion site will be marked. Ask which artery will be used for the procedure. ? What steps will be taken to help prevent infection. These may include:  Removing hair at the insertion site.  Washing skin with a germ-killing soap.  Taking antibiotic medicine. What happens during the procedure?  An IV will be inserted into one of your veins.  Electrodes may be placed on your chest to monitor your heart rate during the procedure.  You will be given one or more of the following: ? A medicine to help  you relax (sedative). ? A medicine to numb the area (local anesthetic) for catheter insertion.  A small incision will be made for catheter insertion.  The catheter will be inserted into an artery using a guide wire. The location may be in your groin, your wrist, or the fold of your arm (near your elbow).  An X-ray procedure (fluoroscopy) will be used to help guide the catheter to the opening of the heart arteries.  A dye will be injected into the catheter. X-rays will be taken. The dye helps to show where any narrowing or blockages are located in the arteries.  Tell your health care provider if you have chest pain or trouble breathing.  A tiny wire will be guided to the blocked spot, and a balloon will be inflated to make the artery wider.  The stent will be expanded to crush the plaques into the wall of the vessel. The stent will hold the area open and improve the blood flow. Most stents have a drug coating to reduce the risk of the stent narrowing over time.  The artery may be made wider using a drill, laser, or other tools that remove plaques.  The catheter will be removed when the blood flow improves. The stent will stay where it was placed, and the lining of the artery will grow over it.  A bandage (dressing) will be placed on the insertion site. Pressure will be applied to stop bleeding.  The IV will be removed. This procedure may vary among health care providers and hospitals.   What happens after the procedure?  Your blood pressure, heart rate, breathing rate, and blood oxygen level will be monitored until you leave the hospital or clinic.  If the procedure is done through the leg, you will lie flat in bed for a few hours or for as long as told by your health care provider. You will be instructed not to bend or cross your legs.  The insertion site and the pulse in your foot or wrist will be checked often.  You may have more blood tests, X-rays, and a test that records the  electrical activity of your heart (electrocardiogram, or ECG).  Do not drive for 24 hours if you were given a sedative during your procedure. Summary  Coronary angiogram with stent placement is a procedure to widen or open a narrowed coronary artery. This is done to treat heart problems.  Before the procedure, let your health care provider know about all the medical conditions and surgeries you have or have had.  This is a safe procedure. However, some problems may occur, including damage to nearby structures or organs, bleeding, blood clots, or allergies.  Follow your health care provider's instructions about eating, drinking, medicines,  and other lifestyle changes, such as quitting tobacco use before the procedure. This information is not intended to replace advice given to you by your health care provider. Make sure you discuss any questions you have with your health care provider. Document Revised: 01/04/2019 Document Reviewed: 01/04/2019 Elsevier Patient Education  2021 [place].   Isosorbide Mononitrate extended-release tablets What is this medicine? ISOSORBIDE MONONITRATE (eye soe SOR bide mon oh NYE trate) is a vasodilator. It relaxes blood vessels, increasing the blood and oxygen supply to your heart. This medicine is used to prevent chest pain caused by angina. It will not help to stop an episode of chest pain. This medicine may be used for other purposes; ask your health care provider or pharmacist if you have questions. COMMON BRAND NAME(S): Imdur, Isotrate ER What should I tell my health care provider before I take this medicine? They need to know if you have any of these conditions:  previous heart attack or heart failure  an unusual or allergic reaction to isosorbide mononitrate, nitrates, other medicines, foods, dyes, or preservatives  pregnant or trying to get pregnant  breast-feeding How should I use this medicine? Take this medicine by mouth with a glass of  water. Follow the directions on the prescription label. Do not crush or chew. Take your medicine at regular intervals. Do not take your medicine more often than directed. Do not stop taking this medicine except on the advice of your doctor or health care professional. Talk to your pediatrician regarding the use of this medicine in children. Special care may be needed. Overdosage: If you think you have taken too much of this medicine contact a poison control center or emergency room at once. NOTE: This medicine is only for you. Do not share this medicine with others. What if I miss a dose? If you miss a dose, take it as soon as you can. If it is almost time for your next dose, take only that dose. Do not take double or extra doses. What may interact with this medicine? Do not take this medicine with any of the following medications:  medicines used to treat erectile dysfunction (ED) like avanafil, sildenafil, tadalafil, and vardenafil  riociguat This medicine may also interact with the following medications:  medicines for high blood pressure  other medicines for angina or heart failure This list may not describe all possible interactions. Give your health care provider a list of all the medicines, herbs, non-prescription drugs, or dietary supplements you use. Also tell them if you smoke, drink alcohol, or use illegal drugs. Some items may interact with your medicine. What should I watch for while using this medicine? Check your heart rate and blood pressure regularly while you are taking this medicine. Ask your doctor or health care professional what your heart rate and blood pressure should be and when you should contact him or her. Tell your doctor or health care professional if you feel your medicine is no longer working. You may get dizzy. Do not drive, use machinery, or do anything that needs mental alertness until you know how this medicine affects you. To reduce the risk of dizzy or  fainting spells, do not sit or stand up quickly, especially if you are an older patient. Alcohol can make you more dizzy, and increase flushing and rapid heartbeats. Avoid alcoholic drinks. Do not treat yourself for coughs, colds, or pain while you are taking this medicine without asking your doctor or health care professional for advice. Some ingredients  may increase your blood pressure. What side effects may I notice from receiving this medicine? Side effects that you should report to your doctor or health care professional as soon as possible:  bluish discoloration of lips, fingernails, or palms of hands  irregular heartbeat, palpitations  low blood pressure  nausea, vomiting  persistent headache  unusually weak or tired Side effects that usually do not require medical attention (report to your doctor or health care professional if they continue or are bothersome):  flushing of the face or neck  rash This list may not describe all possible side effects. Call your doctor for medical advice about side effects. You may report side effects to FDA at 1-800-FDA-1088. Where should I keep my medicine? Keep out of the reach of children. Store between 15 and 30 degrees C (59 and 86 degrees F). Keep container tightly closed. Throw away any unused medicine after the expiration date. NOTE: This sheet is a summary. It may not cover all possible information. If you have questions about this medicine, talk to your doctor, pharmacist, or health care provider.  2021 Elsevier/Gold Standard (2013-04-14 14:48:19)

## 2020-09-17 ENCOUNTER — Ambulatory Visit (HOSPITAL_BASED_OUTPATIENT_CLINIC_OR_DEPARTMENT_OTHER)
Admission: RE | Admit: 2020-09-17 | Discharge: 2020-09-17 | Disposition: A | Discharge status: Home / Self Care | Payer: 59 | Source: Ambulatory Visit | Attending: Cardiology | Admitting: Cardiology

## 2020-09-17 ENCOUNTER — Telehealth: Payer: Self-pay | Admitting: Internal Medicine

## 2020-09-17 DIAGNOSIS — R9439 Abnormal result of other cardiovascular function study: Secondary | ICD-10-CM | POA: Diagnosis not present

## 2020-09-17 DIAGNOSIS — I2510 Atherosclerotic heart disease of native coronary artery without angina pectoris: Secondary | ICD-10-CM | POA: Insufficient documentation

## 2020-09-17 LAB — CBC
Hematocrit: 44.4 % (ref 37.5–51.0)
Hemoglobin: 15 g/dL (ref 13.0–17.7)
MCH: 30.2 pg (ref 26.6–33.0)
MCHC: 33.8 g/dL (ref 31.5–35.7)
MCV: 89 fL (ref 79–97)
Platelets: 175 10*3/uL (ref 150–450)
RBC: 4.97 x10E6/uL (ref 4.14–5.80)
RDW: 15.2 % (ref 11.6–15.4)
WBC: 6.3 10*3/uL (ref 3.4–10.8)

## 2020-09-17 LAB — BASIC METABOLIC PANEL
BUN/Creatinine Ratio: 14 (ref 10–24)
BUN: 21 mg/dL (ref 8–27)
CO2: 19 mmol/L — ABNORMAL LOW (ref 20–29)
Calcium: 9.6 mg/dL (ref 8.6–10.2)
Chloride: 102 mmol/L (ref 96–106)
Creatinine, Ser: 1.55 mg/dL — ABNORMAL HIGH (ref 0.76–1.27)
Glucose: 181 mg/dL — ABNORMAL HIGH (ref 65–99)
Potassium: 4.8 mmol/L (ref 3.5–5.2)
Sodium: 139 mmol/L (ref 134–144)
eGFR: 50 mL/min/{1.73_m2} — ABNORMAL LOW (ref >59)

## 2020-09-17 NOTE — Telephone Encounter (Signed)
Pt came in office wanting to inform provider, pt next Tuesday on Nov 24, 2020 pt is having cardiac cauterization done. Pt just wanted to inform provider so provider can be aware.

## 2020-09-17 NOTE — Telephone Encounter (Signed)
Thank you, I appreciate the update

## 2020-09-17 NOTE — Telephone Encounter (Signed)
FYI

## 2020-09-18 ENCOUNTER — Telehealth: Payer: Self-pay | Admitting: Cardiology

## 2020-09-18 NOTE — Telephone Encounter (Signed)
Patient state he was returning phone call that was left for him

## 2020-09-18 NOTE — Telephone Encounter (Signed)
Called patient informed him that he will need IV hydration due to kidney function. Called cath lab spoke to susie got his cath switched from 530 am with Dr. Claiborne Billings on 09/24/20 to 1030 am with Dr. Irish Lack, patient will still arrive a 530 am on 09/24/20 for iv hydration. No further questions.

## 2020-09-19 ENCOUNTER — Other Ambulatory Visit: Payer: Self-pay | Admitting: Pharmacist

## 2020-09-19 MED ORDER — HUMIRA PEN 40 MG/0.8ML ~~LOC~~ PNKT: PEN_INJECTOR | SUBCUTANEOUS | 5 refills | Status: DC

## 2020-09-20 ENCOUNTER — Other Ambulatory Visit (HOSPITAL_COMMUNITY): Payer: Self-pay

## 2020-09-21 ENCOUNTER — Other Ambulatory Visit (HOSPITAL_COMMUNITY)
Admission: RE | Admit: 2020-09-21 | Discharge: 2020-09-21 | Disposition: A | Discharge status: Home / Self Care | Payer: 59 | Source: Ambulatory Visit | Attending: Interventional Cardiology | Admitting: Interventional Cardiology

## 2020-09-21 ENCOUNTER — Other Ambulatory Visit (HOSPITAL_COMMUNITY): Payer: 59

## 2020-09-21 DIAGNOSIS — Z20822 Contact with and (suspected) exposure to covid-19: Secondary | ICD-10-CM | POA: Diagnosis not present

## 2020-09-21 DIAGNOSIS — Z01812 Encounter for preprocedural laboratory examination: Secondary | ICD-10-CM | POA: Diagnosis not present

## 2020-09-21 LAB — SARS CORONAVIRUS 2 (TAT 6-24 HRS): SARS Coronavirus 2: NEGATIVE

## 2020-09-23 ENCOUNTER — Telehealth: Payer: Self-pay | Admitting: *Deleted

## 2020-09-23 NOTE — Telephone Encounter (Addendum)
Pt contacted pre-catheterization scheduled at [facility] for: Tuesday September 24, 2020 10:30 AM Verified arrival time and place: [facility]) at: 5:30 AM-pre-procedure hydration    No solid food after midnight prior to cath, clear liquids until 5 AM day of procedure.  Hold: Metformin-day of procedure and 48 hours post procedure Glimepiride-AM of procedure  Insulin pump-pt will manage and bring with him.  Except hold medications AM meds can be  taken pre-cath with sips of water including: ASA 81 mg-pt reports he can take and tolerate   Confirmed patient has responsible adult to drive home post procedure and be with patient first 24 hours after arriving home: yes  You are allowed ONE visitor in the waiting room during the time you are at the hospital for your procedure. Both you and your visitor must wear a mask once you enter the hospital.   Reviewed procedure/mask/visitor instructions, pre-procedure hydration with patient.

## 2020-09-24 ENCOUNTER — Other Ambulatory Visit: Payer: Self-pay | Admitting: Physician Assistant

## 2020-09-24 ENCOUNTER — Other Ambulatory Visit: Payer: Self-pay

## 2020-09-24 ENCOUNTER — Ambulatory Visit (HOSPITAL_COMMUNITY)
Admission: RE | Admit: 2020-09-24 | Discharge: 2020-09-24 | Disposition: A | Discharge status: Home / Self Care | Payer: 59 | Attending: Interventional Cardiology | Admitting: Interventional Cardiology

## 2020-09-24 ENCOUNTER — Encounter (HOSPITAL_COMMUNITY)
Admission: RE | Disposition: A | Discharge status: Home / Self Care | Payer: Self-pay | Source: Home / Self Care | Attending: Interventional Cardiology

## 2020-09-24 DIAGNOSIS — Z882 Allergy status to sulfonamides status: Secondary | ICD-10-CM | POA: Diagnosis not present

## 2020-09-24 DIAGNOSIS — E1140 Type 2 diabetes mellitus with diabetic neuropathy, unspecified: Secondary | ICD-10-CM | POA: Diagnosis not present

## 2020-09-24 DIAGNOSIS — Z982 Presence of cerebrospinal fluid drainage device: Secondary | ICD-10-CM | POA: Insufficient documentation

## 2020-09-24 DIAGNOSIS — Z79899 Other long term (current) drug therapy: Secondary | ICD-10-CM | POA: Insufficient documentation

## 2020-09-24 DIAGNOSIS — Z9049 Acquired absence of other specified parts of digestive tract: Secondary | ICD-10-CM | POA: Diagnosis not present

## 2020-09-24 DIAGNOSIS — Z96642 Presence of left artificial hip joint: Secondary | ICD-10-CM | POA: Insufficient documentation

## 2020-09-24 DIAGNOSIS — K7460 Unspecified cirrhosis of liver: Secondary | ICD-10-CM | POA: Insufficient documentation

## 2020-09-24 DIAGNOSIS — K7581 Nonalcoholic steatohepatitis (NASH): Secondary | ICD-10-CM | POA: Insufficient documentation

## 2020-09-24 DIAGNOSIS — Z794 Long term (current) use of insulin: Secondary | ICD-10-CM | POA: Diagnosis not present

## 2020-09-24 DIAGNOSIS — E782 Mixed hyperlipidemia: Secondary | ICD-10-CM | POA: Insufficient documentation

## 2020-09-24 DIAGNOSIS — Z7984 Long term (current) use of oral hypoglycemic drugs: Secondary | ICD-10-CM | POA: Insufficient documentation

## 2020-09-24 DIAGNOSIS — Z8249 Family history of ischemic heart disease and other diseases of the circulatory system: Secondary | ICD-10-CM | POA: Insufficient documentation

## 2020-09-24 DIAGNOSIS — Z6831 Body mass index (BMI) 31.0-31.9, adult: Secondary | ICD-10-CM | POA: Diagnosis not present

## 2020-09-24 DIAGNOSIS — Z885 Allergy status to narcotic agent status: Secondary | ICD-10-CM | POA: Diagnosis not present

## 2020-09-24 DIAGNOSIS — R931 Abnormal findings on diagnostic imaging of heart and coronary circulation: Secondary | ICD-10-CM | POA: Insufficient documentation

## 2020-09-24 DIAGNOSIS — I10 Essential (primary) hypertension: Secondary | ICD-10-CM | POA: Insufficient documentation

## 2020-09-24 DIAGNOSIS — I2510 Atherosclerotic heart disease of native coronary artery without angina pectoris: Secondary | ICD-10-CM

## 2020-09-24 HISTORY — PX: LEFT HEART CATH AND CORONARY ANGIOGRAPHY: CATH118249

## 2020-09-24 LAB — GLUCOSE, CAPILLARY
Glucose-Capillary: 129 mg/dL — ABNORMAL HIGH (ref 70–99)
Glucose-Capillary: 134 mg/dL — ABNORMAL HIGH (ref 70–99)

## 2020-09-24 SURGERY — LEFT HEART CATH AND CORONARY ANGIOGRAPHY
Anesthesia: LOCAL

## 2020-09-24 MED ORDER — ACETAMINOPHEN 325 MG PO TABS
650.0000 mg | ORAL_TABLET | ORAL | Status: DC | PRN
  Administered 2020-09-24: 650 mg via ORAL
  Filled 2020-09-24: qty 2

## 2020-09-24 MED ORDER — ASPIRIN 81 MG PO CHEW: 81.0000 mg | CHEWABLE_TABLET | ORAL | Status: DC

## 2020-09-24 MED ORDER — LABETALOL HCL 5 MG/ML IV SOLN
10.0000 mg | INTRAVENOUS | Status: DC | PRN
Start: 2020-09-24 — End: 2020-09-24

## 2020-09-24 MED ORDER — HEPARIN SODIUM (PORCINE) 1000 UNIT/ML IJ SOLN
INTRAMUSCULAR | Status: DC | PRN
  Administered 2020-09-24: 5000 [IU] via INTRAVENOUS

## 2020-09-24 MED ORDER — HEPARIN (PORCINE) IN NACL 1000-0.9 UT/500ML-% IV SOLN
INTRAVENOUS | Status: AC
  Filled 2020-09-24: qty 1000

## 2020-09-24 MED ORDER — MIDAZOLAM HCL 2 MG/2ML IJ SOLN
INTRAMUSCULAR | Status: DC | PRN
  Administered 2020-09-24: 2 mg via INTRAVENOUS
  Administered 2020-09-24: 1 mg via INTRAVENOUS

## 2020-09-24 MED ORDER — MIDAZOLAM HCL 2 MG/2ML IJ SOLN
INTRAMUSCULAR | Status: AC
  Filled 2020-09-24: qty 2

## 2020-09-24 MED ORDER — LIDOCAINE HCL (PF) 1 % IJ SOLN
INTRAMUSCULAR | Status: DC | PRN
  Administered 2020-09-24: 2 mL

## 2020-09-24 MED ORDER — SODIUM CHLORIDE 0.9 % IV SOLN: 250.0000 mL | INTRAVENOUS | Status: DC | PRN

## 2020-09-24 MED ORDER — HEPARIN (PORCINE) IN NACL 1000-0.9 UT/500ML-% IV SOLN
INTRAVENOUS | Status: DC | PRN
  Administered 2020-09-24 (×2): 500 mL

## 2020-09-24 MED ORDER — CLOPIDOGREL BISULFATE 300 MG PO TABS
300.0000 mg | ORAL_TABLET | Freq: Once | ORAL | Status: AC
  Administered 2020-09-24: 300 mg via ORAL
  Filled 2020-09-24: qty 1

## 2020-09-24 MED ORDER — ONDANSETRON HCL 4 MG/2ML IJ SOLN: 4.0000 mg | Freq: Four times a day (QID) | INTRAMUSCULAR | Status: DC | PRN

## 2020-09-24 MED ORDER — SODIUM CHLORIDE 0.9 % IV SOLN: INTRAVENOUS | Status: DC

## 2020-09-24 MED ORDER — SODIUM CHLORIDE 0.9 % WEIGHT BASED INFUSION
1.0000 mL/kg/h | INTRAVENOUS | Status: DC
  Administered 2020-09-24: 1 mL/kg/h via INTRAVENOUS

## 2020-09-24 MED ORDER — HYDRALAZINE HCL 20 MG/ML IJ SOLN: 10.0000 mg | INTRAMUSCULAR | Status: DC | PRN

## 2020-09-24 MED ORDER — LIDOCAINE HCL (PF) 1 % IJ SOLN
INTRAMUSCULAR | Status: AC
  Filled 2020-09-24: qty 30

## 2020-09-24 MED ORDER — PANTOPRAZOLE SODIUM 40 MG PO TBEC: 40.0000 mg | DELAYED_RELEASE_TABLET | Freq: Every day | ORAL | 3 refills | Status: DC

## 2020-09-24 MED ORDER — SODIUM CHLORIDE 0.9% FLUSH: 3.0000 mL | INTRAVENOUS | Status: DC | PRN

## 2020-09-24 MED ORDER — VERAPAMIL HCL 2.5 MG/ML IV SOLN
INTRAVENOUS | Status: DC | PRN
  Administered 2020-09-24: 2 mg via INTRAVENOUS

## 2020-09-24 MED ORDER — FENTANYL CITRATE (PF) 100 MCG/2ML IJ SOLN
INTRAMUSCULAR | Status: DC | PRN
  Administered 2020-09-24 (×2): 25 ug via INTRAVENOUS

## 2020-09-24 MED ORDER — FENTANYL CITRATE (PF) 100 MCG/2ML IJ SOLN
INTRAMUSCULAR | Status: AC
  Filled 2020-09-24: qty 2

## 2020-09-24 MED ORDER — HEPARIN SODIUM (PORCINE) 1000 UNIT/ML IJ SOLN
INTRAMUSCULAR | Status: AC
  Filled 2020-09-24: qty 1

## 2020-09-24 MED ORDER — VERAPAMIL HCL 2.5 MG/ML IV SOLN
INTRAVENOUS | Status: DC | PRN
  Administered 2020-09-24: 10 mL via INTRA_ARTERIAL

## 2020-09-24 MED ORDER — VERAPAMIL HCL 2.5 MG/ML IV SOLN
INTRAVENOUS | Status: AC
  Filled 2020-09-24: qty 2

## 2020-09-24 MED ORDER — SODIUM CHLORIDE 0.9 % WEIGHT BASED INFUSION
3.0000 mL/kg/h | INTRAVENOUS | Status: AC
  Administered 2020-09-24: 3 mL/kg/h via INTRAVENOUS

## 2020-09-24 MED ORDER — IOHEXOL 350 MG/ML SOLN
INTRAVENOUS | Status: DC | PRN
  Administered 2020-09-24: 90 mL

## 2020-09-24 MED ORDER — SODIUM CHLORIDE 0.9% FLUSH: 3.0000 mL | Freq: Two times a day (BID) | INTRAVENOUS | Status: DC

## 2020-09-24 MED ORDER — CLOPIDOGREL BISULFATE 75 MG PO TABS: 75.0000 mg | ORAL_TABLET | Freq: Every day | ORAL | 3 refills | Status: DC

## 2020-09-24 MED FILL — CLOPIDOGREL 75 MG TABLET: 75 | 90 days supply | Qty: 90 | Fill #0

## 2020-09-24 MED FILL — PANTOPRAZOLE SOD DR 40 MG T: 40 | 90 days supply | Qty: 90 | Fill #0

## 2020-09-24 SURGICAL SUPPLY — 13 items
CATH 5FR JL3.5 JR4 ANG PIG MP (CATHETERS) ×2 IMPLANT
CATH INFINITI 5 FR AR1 MOD (CATHETERS) ×2 IMPLANT
CATH INFINITI 5FR AL1 (CATHETERS) ×2 IMPLANT
DEVICE RAD COMP TR BAND LRG (VASCULAR PRODUCTS) ×2 IMPLANT
GLIDESHEATH SLEND SS 6F .021 (SHEATH) ×2 IMPLANT
GUIDEWIRE INQWIRE 1.5J.035X260 (WIRE) ×1 IMPLANT
INQWIRE 1.5J .035X260CM (WIRE) ×2
KIT HEART LEFT (KITS) ×2 IMPLANT
PACK CARDIAC CATHETERIZATION (CUSTOM PROCEDURE TRAY) ×2 IMPLANT
SHEATH 6FR 85 DEST SLENDER (SHEATH) ×2 IMPLANT
SHEATH PROBE COVER 6X72 (BAG) ×2 IMPLANT
TRANSDUCER W/STOPCOCK (MISCELLANEOUS) ×2 IMPLANT
TUBING CIL FLEX 10 FLL-RA (TUBING) ×2 IMPLANT

## 2020-09-24 NOTE — Interval H&P Note (Signed)
Cath Lab Visit (complete for each Cath Lab visit)  Clinical Evaluation Leading to the Procedure:   ACS: No.  Non-ACS:    Anginal Classification: CCS III  Anti-ischemic medical therapy: Minimal Therapy (1 class of medications)  Non-Invasive Test Results: High-risk stress test findings: cardiac mortality >3%/year  Prior CABG: No previous CABG      History and Physical Interval Note:  09/24/2020 10:37 AM  James [name]  has presented today for surgery, with the diagnosis of abnormal fractional flow reserve.  The various methods of treatment have been discussed with the patient and family. After consideration of risks, benefits and other options for treatment, the patient has consented to  Procedure(s): LEFT HEART CATH AND CORONARY ANGIOGRAPHY (N/A) as a surgical intervention.  The patient's history has been reviewed, patient examined, no change in status, stable for surgery.  I have reviewed the patient's chart and labs.  Questions were answered to the patient's satisfaction.     Larae Grooms

## 2020-09-24 NOTE — Discharge Instructions (Signed)
Stop metformin for now.  Will plan on restarting metformin after kidney function is rechecked.   Get Covid test on Thursday morning 09/26/20.  Arrive at hospital on Friday morning 09/27/20 @ 0800, you will received extra IV fluids to protect kidneys.  You will spend the night.  Nothing to eat of drink after midnight on Thursday night.  Follow same instructions that you had for the the cath procedure today.    DRINK PLENTY OF FLUIDS OVER THE NEXT 2-3 DAYS. Radial Site Care  This sheet gives you information about how to care for yourself after your procedure. Your health care provider may also give you more specific instructions. If you have problems or questions, contact your health care provider. What can I expect after the procedure? After the procedure, it is common to have:  Bruising and tenderness at the catheter insertion area. Follow these instructions at home: Medicines  Take over-the-counter and prescription medicines only as told by your health care provider. Insertion site care  Follow instructions from your health care provider about how to take care of your insertion site. Make sure you: ? Wash your hands with soap and water before you change your bandage (dressing). If soap and water are not available, use hand sanitizer. ? Change your dressing as told by your health care provider. ? Leave stitches (sutures), skin glue, or adhesive strips in place. These skin closures may need to stay in place for 2 weeks or longer. If adhesive strip edges start to loosen and curl up, you may trim the loose edges. Do not remove adhesive strips completely unless your health care provider tells you to do that.  Check your insertion site every day for signs of infection. Check for: ? Redness, swelling, or pain. ? Fluid or blood. ? Pus or a bad smell. ? Warmth.  Do not take baths, swim, or use a hot tub until your health care provider approves.  You may shower 24-48 hours after the procedure,  or as directed by your health care provider. ? Remove the dressing and gently wash the site with plain soap and water. ? Pat the area dry with a clean towel. ? Do not rub the site. That could cause bleeding.  Do not apply powder or lotion to the site. Activity  For 24 hours after the procedure, or as directed by your health care provider: ? Do not flex or bend the affected arm. ? Do not push or pull heavy objects with the affected arm. ? Do not drive yourself home from the hospital or clinic. You may drive 24 hours after the procedure unless your health care provider tells you not to. ? Do not operate machinery or power tools.  Do not lift anything that is heavier than 10 lb (4.5 kg), or the limit that you are told, until your health care provider says that it is safe.  Ask your health care provider when it is okay to: ? Return to work or school. ? Resume usual physical activities or sports. ? Resume sexual activity.   General instructions  If the catheter site starts to bleed, raise your arm and put firm pressure on the site. If the bleeding does not stop, get help right away. This is a medical emergency.  If you went home on the same day as your procedure, a responsible adult should be with you for the first 24 hours after you arrive home.  Keep all follow-up visits as told by your health care provider.  This is important. Contact a health care provider if:  You have a fever.  You have redness, swelling, or yellow drainage around your insertion site. Get help right away if:  You have unusual pain at the radial site.  The catheter insertion area swells very fast.  The insertion area is bleeding, and the bleeding does not stop when you hold steady pressure on the area.  Your arm or hand becomes pale, cool, tingly, or numb. These symptoms may represent a serious problem that is an emergency. Do not wait to see if the symptoms will go away. Get medical help right away. Call your  local emergency services (911 in the [place] Do not drive yourself to the hospital. Summary  After the procedure, it is common to have bruising and tenderness at the site.  Follow instructions from your health care provider about how to take care of your radial site wound. Check the wound every day for signs of infection.  Do not lift anything that is heavier than 10 lb (4.5 kg), or the limit that you are told, until your health care provider says that it is safe. This information is not intended to replace advice given to you by your health care provider. Make sure you discuss any questions you have with your health care provider. Document Revised: 07/21/2017 Document Reviewed: 07/21/2017 Elsevier Patient Education  2021 [employer].

## 2020-09-25 ENCOUNTER — Encounter (HOSPITAL_COMMUNITY): Payer: Self-pay | Admitting: Interventional Cardiology

## 2020-09-25 ENCOUNTER — Other Ambulatory Visit: Payer: Self-pay | Admitting: Endocrinology

## 2020-09-25 MED FILL — clonazePAM 0.5 MG TABS: 0.5 | 30 days supply | Qty: 30 | Fill #2

## 2020-09-25 MED FILL — HUMIRA PEN 40 MG/0.8ML PNKT: 40 | 28 days supply | Qty: 2 | Fill #0

## 2020-09-25 MED FILL — FREESTYLE LIBRE 14 DAY SENS: 28 days supply | Qty: 2 | Fill #0

## 2020-09-26 ENCOUNTER — Telehealth: Payer: Self-pay | Admitting: *Deleted

## 2020-09-26 ENCOUNTER — Other Ambulatory Visit (HOSPITAL_COMMUNITY)
Admission: RE | Admit: 2020-09-26 | Discharge: 2020-09-26 | Disposition: A | Discharge status: Home / Self Care | Payer: 59 | Source: Ambulatory Visit | Attending: Interventional Cardiology | Admitting: Interventional Cardiology

## 2020-09-26 ENCOUNTER — Other Ambulatory Visit: Payer: 59

## 2020-09-26 DIAGNOSIS — Z20822 Contact with and (suspected) exposure to covid-19: Secondary | ICD-10-CM | POA: Diagnosis not present

## 2020-09-26 DIAGNOSIS — Z01812 Encounter for preprocedural laboratory examination: Secondary | ICD-10-CM | POA: Diagnosis not present

## 2020-09-26 LAB — SARS CORONAVIRUS 2 (TAT 6-24 HRS): SARS Coronavirus 2: NEGATIVE

## 2020-09-26 NOTE — Telephone Encounter (Addendum)
Pt contacted pre-coronary stent intervention  scheduled at [facility] for: Friday September 27, 2020 1:30 PM Verified arrival time and place: [facility]) at: 8 AM-pre-procedure hydration per discharge instructions 09/24/20   No solid food after midnight prior to cath, clear liquids until 5 AM day of procedure.  Hold: Glimepiride-AM of procedure Insulin pump-pt will manage and take to hospital Ozempic-weekly on Fridays-will take day before or day after procedure  Except hold medications AM meds can be  taken pre-cath with sips of water including: ASA 81 mg-pt reports he can take and tolerate Plavix 75 mg  Confirmed patient has responsible adult to drive home post procedure and be with patient first 24 hours after arriving home: yes  You are allowed ONE visitor in the waiting room during the time you are at the hospital for your procedure. Both you and your visitor must wear a mask once you enter the hospital.       Reviewed procedure/mask/visitor instructions with patient.

## 2020-09-27 ENCOUNTER — Encounter (HOSPITAL_COMMUNITY): Payer: Self-pay | Admitting: Interventional Cardiology

## 2020-09-27 ENCOUNTER — Other Ambulatory Visit: Payer: Self-pay

## 2020-09-27 ENCOUNTER — Encounter (HOSPITAL_COMMUNITY)
Admission: RE | Disposition: A | Discharge status: Home / Self Care | Payer: Self-pay | Source: Home / Self Care | Attending: Interventional Cardiology

## 2020-09-27 ENCOUNTER — Ambulatory Visit (HOSPITAL_COMMUNITY)
Admission: RE | Admit: 2020-09-27 | Discharge: 2020-09-28 | Disposition: A | Discharge status: Home / Self Care | Payer: 59 | Attending: Interventional Cardiology | Admitting: Interventional Cardiology

## 2020-09-27 DIAGNOSIS — K7581 Nonalcoholic steatohepatitis (NASH): Secondary | ICD-10-CM | POA: Insufficient documentation

## 2020-09-27 DIAGNOSIS — Z794 Long term (current) use of insulin: Secondary | ICD-10-CM | POA: Insufficient documentation

## 2020-09-27 DIAGNOSIS — E782 Mixed hyperlipidemia: Secondary | ICD-10-CM | POA: Insufficient documentation

## 2020-09-27 DIAGNOSIS — E785 Hyperlipidemia, unspecified: Secondary | ICD-10-CM | POA: Diagnosis present

## 2020-09-27 DIAGNOSIS — Z91030 Bee allergy status: Secondary | ICD-10-CM | POA: Diagnosis not present

## 2020-09-27 DIAGNOSIS — Z885 Allergy status to narcotic agent status: Secondary | ICD-10-CM | POA: Diagnosis not present

## 2020-09-27 DIAGNOSIS — Z79899 Other long term (current) drug therapy: Secondary | ICD-10-CM | POA: Insufficient documentation

## 2020-09-27 DIAGNOSIS — I10 Essential (primary) hypertension: Secondary | ICD-10-CM | POA: Insufficient documentation

## 2020-09-27 DIAGNOSIS — Z888 Allergy status to other drugs, medicaments and biological substances status: Secondary | ICD-10-CM | POA: Insufficient documentation

## 2020-09-27 DIAGNOSIS — Z7984 Long term (current) use of oral hypoglycemic drugs: Secondary | ICD-10-CM | POA: Diagnosis not present

## 2020-09-27 DIAGNOSIS — Z882 Allergy status to sulfonamides status: Secondary | ICD-10-CM | POA: Insufficient documentation

## 2020-09-27 DIAGNOSIS — I25118 Atherosclerotic heart disease of native coronary artery with other forms of angina pectoris: Secondary | ICD-10-CM

## 2020-09-27 DIAGNOSIS — R0789 Other chest pain: Secondary | ICD-10-CM | POA: Diagnosis not present

## 2020-09-27 DIAGNOSIS — I2510 Atherosclerotic heart disease of native coronary artery without angina pectoris: Secondary | ICD-10-CM | POA: Insufficient documentation

## 2020-09-27 DIAGNOSIS — K7460 Unspecified cirrhosis of liver: Secondary | ICD-10-CM | POA: Diagnosis not present

## 2020-09-27 DIAGNOSIS — Z955 Presence of coronary angioplasty implant and graft: Secondary | ICD-10-CM

## 2020-09-27 DIAGNOSIS — E119 Type 2 diabetes mellitus without complications: Secondary | ICD-10-CM | POA: Diagnosis not present

## 2020-09-27 HISTORY — PX: LEFT HEART CATH: CATH118248

## 2020-09-27 HISTORY — PX: CORONARY STENT INTERVENTION: CATH118234

## 2020-09-27 HISTORY — PX: INTRAVASCULAR ULTRASOUND/IVUS: CATH118244

## 2020-09-27 LAB — CBC
HCT: 38.4 % — ABNORMAL LOW (ref 39.0–52.0)
Hemoglobin: 13.1 g/dL (ref 13.0–17.0)
MCH: 30 pg (ref 26.0–34.0)
MCHC: 34.1 g/dL (ref 30.0–36.0)
MCV: 88.1 fL (ref 80.0–100.0)
Platelets: 124 10*3/uL — ABNORMAL LOW (ref 150–400)
RBC: 4.36 MIL/uL (ref 4.22–5.81)
RDW: 16.1 % — ABNORMAL HIGH (ref 11.5–15.5)
WBC: 5.2 10*3/uL (ref 4.0–10.5)
nRBC: 0 % (ref 0.0–0.2)

## 2020-09-27 LAB — BASIC METABOLIC PANEL
Anion gap: 6 (ref 5–15)
BUN: 13 mg/dL (ref 8–23)
CO2: 26 mmol/L (ref 22–32)
Calcium: 8.9 mg/dL (ref 8.9–10.3)
Chloride: 107 mmol/L (ref 98–111)
Creatinine, Ser: 1.48 mg/dL — ABNORMAL HIGH (ref 0.61–1.24)
GFR, Estimated: 53 mL/min — ABNORMAL LOW (ref >60)
Glucose, Bld: 122 mg/dL — ABNORMAL HIGH (ref 70–99)
Potassium: 4 mmol/L (ref 3.5–5.1)
Sodium: 139 mmol/L (ref 135–145)

## 2020-09-27 LAB — GLUCOSE, CAPILLARY
Glucose-Capillary: 106 mg/dL — ABNORMAL HIGH (ref 70–99)
Glucose-Capillary: 109 mg/dL — ABNORMAL HIGH (ref 70–99)
Glucose-Capillary: 89 mg/dL (ref 70–99)
Glucose-Capillary: 159 mg/dL — ABNORMAL HIGH (ref 70–99)

## 2020-09-27 LAB — POCT ACTIVATED CLOTTING TIME
Activated Clotting Time: 285 seconds
Activated Clotting Time: 321 seconds
Activated Clotting Time: 255 seconds
Activated Clotting Time: 166 seconds
Activated Clotting Time: 196 seconds

## 2020-09-27 LAB — HEMOGLOBIN A1C
Hgb A1c MFr Bld: 6.8 % — ABNORMAL HIGH (ref 4.8–5.6)
Mean Plasma Glucose: 148.46 mg/dL

## 2020-09-27 SURGERY — CORONARY STENT INTERVENTION
Anesthesia: LOCAL

## 2020-09-27 MED ORDER — ISOSORBIDE MONONITRATE ER 30 MG PO TB24
30.0000 mg | ORAL_TABLET | Freq: Every day | ORAL | Status: DC
  Administered 2020-09-27 – 2020-09-28 (×2): 30 mg via ORAL
  Filled 2020-09-27 (×2): qty 1

## 2020-09-27 MED ORDER — ACETAMINOPHEN 325 MG PO TABS
ORAL_TABLET | ORAL | Status: AC
  Filled 2020-09-27: qty 2

## 2020-09-27 MED ORDER — ASPIRIN 81 MG PO CHEW
81.0000 mg | CHEWABLE_TABLET | Freq: Every day | ORAL | Status: DC
  Administered 2020-09-28: 81 mg via ORAL
  Filled 2020-09-27: qty 1

## 2020-09-27 MED ORDER — MIDAZOLAM HCL 2 MG/2ML IJ SOLN
INTRAMUSCULAR | Status: DC | PRN
  Administered 2020-09-27: 2 mg via INTRAVENOUS
  Administered 2020-09-27: 1 mg via INTRAVENOUS

## 2020-09-27 MED ORDER — CLONAZEPAM 0.5 MG PO TABS
0.5000 mg | ORAL_TABLET | Freq: Every day | ORAL | Status: DC
  Administered 2020-09-27: 0.5 mg via ORAL
  Filled 2020-09-27: qty 1

## 2020-09-27 MED ORDER — SODIUM CHLORIDE 0.9% FLUSH
3.0000 mL | Freq: Two times a day (BID) | INTRAVENOUS | Status: DC
  Administered 2020-09-27: 3 mL via INTRAVENOUS

## 2020-09-27 MED ORDER — IOHEXOL 350 MG/ML SOLN
INTRAVENOUS | Status: AC
  Filled 2020-09-27: qty 1

## 2020-09-27 MED ORDER — SODIUM CHLORIDE 0.9 % WEIGHT BASED INFUSION: 1.0000 mL/kg/h | INTRAVENOUS | Status: DC

## 2020-09-27 MED ORDER — HEPARIN SODIUM (PORCINE) 1000 UNIT/ML IJ SOLN
INTRAMUSCULAR | Status: AC
  Filled 2020-09-27: qty 1

## 2020-09-27 MED ORDER — SODIUM CHLORIDE 0.9 % WEIGHT BASED INFUSION
3.0000 mL/kg/h | INTRAVENOUS | Status: DC
  Administered 2020-09-27: 3 mL/kg/h via INTRAVENOUS

## 2020-09-27 MED ORDER — FENTANYL CITRATE (PF) 100 MCG/2ML IJ SOLN
INTRAMUSCULAR | Status: AC
  Filled 2020-09-27: qty 2

## 2020-09-27 MED ORDER — PANTOPRAZOLE SODIUM 40 MG PO TBEC
40.0000 mg | DELAYED_RELEASE_TABLET | Freq: Every day | ORAL | Status: DC
Start: 2020-09-27 — End: 2020-09-28
  Administered 2020-09-27 – 2020-09-28 (×2): 40 mg via ORAL
  Filled 2020-09-27 (×2): qty 1

## 2020-09-27 MED ORDER — ACETAMINOPHEN 325 MG PO TABS
650.0000 mg | ORAL_TABLET | Freq: Once | ORAL | Status: AC
  Administered 2020-09-27: 650 mg via ORAL
  Filled 2020-09-27: qty 2

## 2020-09-27 MED ORDER — GLIMEPIRIDE 4 MG PO TABS
4.0000 mg | ORAL_TABLET | Freq: Every day | ORAL | Status: DC
  Administered 2020-09-28: 4 mg via ORAL
  Filled 2020-09-27: qty 1

## 2020-09-27 MED ORDER — LIDOCAINE HCL (PF) 1 % IJ SOLN
INTRAMUSCULAR | Status: DC | PRN
  Administered 2020-09-27: 20 mL

## 2020-09-27 MED ORDER — ATORVASTATIN CALCIUM 80 MG PO TABS
80.0000 mg | ORAL_TABLET | Freq: Every day | ORAL | Status: DC
  Administered 2020-09-27: 80 mg via ORAL
  Filled 2020-09-27: qty 1

## 2020-09-27 MED ORDER — DULOXETINE HCL 60 MG PO CPEP
60.0000 mg | ORAL_CAPSULE | Freq: Two times a day (BID) | ORAL | Status: DC
  Administered 2020-09-27 – 2020-09-28 (×2): 60 mg via ORAL
  Filled 2020-09-27 (×2): qty 1

## 2020-09-27 MED ORDER — CLOPIDOGREL BISULFATE 75 MG PO TABS
75.0000 mg | ORAL_TABLET | ORAL | Status: AC
  Administered 2020-09-27: 75 mg via ORAL
  Filled 2020-09-27: qty 1

## 2020-09-27 MED ORDER — SODIUM CHLORIDE 0.9 % IV SOLN
INTRAVENOUS | Status: AC | PRN
  Administered 2020-09-27: 114 mL/h via INTRAVENOUS

## 2020-09-27 MED ORDER — LABETALOL HCL 5 MG/ML IV SOLN: 10.0000 mg | INTRAVENOUS | Status: AC | PRN

## 2020-09-27 MED ORDER — HEPARIN (PORCINE) IN NACL 1000-0.9 UT/500ML-% IV SOLN
INTRAVENOUS | Status: DC | PRN
  Administered 2020-09-27: 500 mL

## 2020-09-27 MED ORDER — SODIUM CHLORIDE 0.9% FLUSH: 3.0000 mL | INTRAVENOUS | Status: DC | PRN

## 2020-09-27 MED ORDER — FENTANYL CITRATE (PF) 100 MCG/2ML IJ SOLN
INTRAMUSCULAR | Status: DC | PRN
  Administered 2020-09-27 (×2): 25 ug via INTRAVENOUS

## 2020-09-27 MED ORDER — CLOPIDOGREL BISULFATE 75 MG PO TABS: 75.0000 mg | ORAL_TABLET | Freq: Every day | ORAL | Status: DC

## 2020-09-27 MED ORDER — NITROGLYCERIN 0.4 MG SL SUBL
SUBLINGUAL_TABLET | SUBLINGUAL | Status: AC
  Filled 2020-09-27: qty 1

## 2020-09-27 MED ORDER — HEPARIN SODIUM (PORCINE) 1000 UNIT/ML IJ SOLN
INTRAMUSCULAR | Status: DC | PRN
  Administered 2020-09-27: 12000 [IU] via INTRAVENOUS
  Administered 2020-09-27: 2000 [IU] via INTRAVENOUS

## 2020-09-27 MED ORDER — HYDRALAZINE HCL 20 MG/ML IJ SOLN
10.0000 mg | INTRAMUSCULAR | Status: AC | PRN
Start: 2020-09-27 — End: 2020-09-27

## 2020-09-27 MED ORDER — ASPIRIN 81 MG PO CHEW: 81.0000 mg | CHEWABLE_TABLET | ORAL | Status: DC

## 2020-09-27 MED ORDER — SEMAGLUTIDE(0.25 OR 0.5MG/DOS) 2 MG/1.5ML ~~LOC~~ SOPN: 0.5000 mg | PEN_INJECTOR | SUBCUTANEOUS | Status: DC

## 2020-09-27 MED ORDER — MIDAZOLAM HCL 2 MG/2ML IJ SOLN
INTRAMUSCULAR | Status: AC
  Filled 2020-09-27: qty 2

## 2020-09-27 MED ORDER — VERAPAMIL HCL 2.5 MG/ML IV SOLN
INTRAVENOUS | Status: AC
  Filled 2020-09-27: qty 2

## 2020-09-27 MED ORDER — ATORVASTATIN CALCIUM 10 MG PO TABS: 20.0000 mg | ORAL_TABLET | Freq: Every day | ORAL | Status: DC

## 2020-09-27 MED ORDER — ACETAMINOPHEN 325 MG PO TABS: 650.0000 mg | ORAL_TABLET | ORAL | Status: DC | PRN

## 2020-09-27 MED ORDER — INSULIN ASPART 100 UNIT/ML ~~LOC~~ SOLN
0.0000 [IU] | Freq: Three times a day (TID) | SUBCUTANEOUS | Status: DC
  Administered 2020-09-28: 3 [IU] via SUBCUTANEOUS

## 2020-09-27 MED ORDER — NITROGLYCERIN 0.4 MG SL SUBL: 0.4000 mg | SUBLINGUAL_TABLET | SUBLINGUAL | Status: DC | PRN

## 2020-09-27 MED ORDER — SODIUM CHLORIDE 0.9 % IV SOLN: INTRAVENOUS | Status: AC

## 2020-09-27 MED ORDER — NITROGLYCERIN 1 MG/10 ML FOR IR/CATH LAB
INTRA_ARTERIAL | Status: AC
  Filled 2020-09-27: qty 10

## 2020-09-27 MED ORDER — LIDOCAINE HCL (PF) 1 % IJ SOLN
INTRAMUSCULAR | Status: AC
  Filled 2020-09-27: qty 30

## 2020-09-27 MED ORDER — ONDANSETRON HCL 4 MG/2ML IJ SOLN: 4.0000 mg | Freq: Four times a day (QID) | INTRAMUSCULAR | Status: DC | PRN

## 2020-09-27 MED ORDER — NITROGLYCERIN 0.4 MG SL SUBL
SUBLINGUAL_TABLET | SUBLINGUAL | Status: AC
  Administered 2020-09-27: 0.4 mg
  Administered 2020-09-27: 4 mg
  Filled 2020-09-27: qty 1

## 2020-09-27 MED ORDER — GABAPENTIN 600 MG PO TABS
600.0000 mg | ORAL_TABLET | Freq: Two times a day (BID) | ORAL | Status: DC
  Administered 2020-09-27 – 2020-09-28 (×2): 600 mg via ORAL
  Filled 2020-09-27 (×2): qty 1

## 2020-09-27 MED ORDER — HEPARIN (PORCINE) IN NACL 1000-0.9 UT/500ML-% IV SOLN
INTRAVENOUS | Status: AC
  Filled 2020-09-27: qty 1000

## 2020-09-27 MED ORDER — NITROGLYCERIN 1 MG/10 ML FOR IR/CATH LAB
INTRA_ARTERIAL | Status: DC | PRN
  Administered 2020-09-27 (×3): 200 ug via INTRACORONARY

## 2020-09-27 MED ORDER — SODIUM CHLORIDE 0.9 % IV SOLN: 250.0000 mL | INTRAVENOUS | Status: DC | PRN

## 2020-09-27 MED ORDER — CARVEDILOL 12.5 MG PO TABS
12.5000 mg | ORAL_TABLET | Freq: Two times a day (BID) | ORAL | Status: DC
  Administered 2020-09-27 – 2020-09-28 (×2): 12.5 mg via ORAL
  Filled 2020-09-27 (×2): qty 1

## 2020-09-27 MED ORDER — IOHEXOL 350 MG/ML SOLN
INTRAVENOUS | Status: DC | PRN
  Administered 2020-09-27: 100 mL

## 2020-09-27 MED ORDER — CLOPIDOGREL BISULFATE 75 MG PO TABS
75.0000 mg | ORAL_TABLET | Freq: Every day | ORAL | Status: DC
  Administered 2020-09-28: 75 mg via ORAL
  Filled 2020-09-27: qty 1

## 2020-09-27 MED ORDER — FENOFIBRATE 54 MG PO TABS
54.0000 mg | ORAL_TABLET | Freq: Every day | ORAL | Status: DC
  Administered 2020-09-27 – 2020-09-28 (×2): 54 mg via ORAL
  Filled 2020-09-27 (×2): qty 1

## 2020-09-27 MED ORDER — SODIUM CHLORIDE 0.9% FLUSH: 3.0000 mL | Freq: Two times a day (BID) | INTRAVENOUS | Status: DC

## 2020-09-27 SURGICAL SUPPLY — 19 items
BALLN SAPPHIRE 2.5X15 (BALLOONS) ×2
BALLN SAPPHIRE ~~LOC~~ 3.0X15 (BALLOONS) ×2 IMPLANT
BALLOON SAPPHIRE 2.5X15 (BALLOONS) ×1 IMPLANT
CATH INFINITI JR4 5F (CATHETERS) ×2 IMPLANT
CATH LAUNCHER 6FR EBU 3.75 (CATHETERS) ×2 IMPLANT
CATH OPTICROSS HD (CATHETERS) ×2 IMPLANT
KIT HEART LEFT (KITS) ×2 IMPLANT
KIT HEMO VALVE WATCHDOG (MISCELLANEOUS) ×2 IMPLANT
PACK CARDIAC CATHETERIZATION (CUSTOM PROCEDURE TRAY) ×2 IMPLANT
SHEATH PINNACLE 6F 10CM (SHEATH) ×2 IMPLANT
SHEATH PROBE COVER 6X72 (BAG) ×2 IMPLANT
SLED PULL BACK IVUS (MISCELLANEOUS) ×2 IMPLANT
STENT SYNERGY XD 2.50X24 (Permanent Stent) ×1 IMPLANT
SYNERGY XD 2.50X24 (Permanent Stent) ×2 IMPLANT
TRANSDUCER W/STOPCOCK (MISCELLANEOUS) ×2 IMPLANT
TUBING CIL FLEX 10 FLL-RA (TUBING) ×2 IMPLANT
WIRE ASAHI PROWATER 180CM (WIRE) ×2 IMPLANT
WIRE EMERALD 3MM-J .035X150CM (WIRE) ×2 IMPLANT
WIRE HI TORQ BMW 190CM (WIRE) ×2 IMPLANT

## 2020-09-27 NOTE — Interval H&P Note (Signed)
Cath Lab Visit (complete for each Cath Lab visit)  Clinical Evaluation Leading to the Procedure:   ACS: Yes.    Non-ACS:    Anginal Classification: CCS IV  Anti-ischemic medical therapy: Maximal Therapy (2 or more classes of medications)  Non-Invasive Test Results: No non-invasive testing performed  Prior CABG: No previous CABG  Had rest pain while in short stay.  Resolved with NTG.     History and Physical Interval Note:  09/27/2020 1:22 PM  James [name]  has presented today for surgery, with the diagnosis of cad.  The various methods of treatment have been discussed with the patient and family. After consideration of risks, benefits and other options for treatment, the patient has consented to  Procedure(s): CORONARY STENT INTERVENTION (N/A) as a surgical intervention.  The patient's history has been reviewed, patient examined, no change in status, stable for surgery.  I have reviewed the patient's chart and labs.  Questions were answered to the patient's satisfaction.     Larae Grooms

## 2020-09-27 NOTE — Progress Notes (Signed)
Pt states chest pain has eased up.  Now complaining of headache.  Vin, PA notified for order for pain med.

## 2020-09-27 NOTE — Progress Notes (Signed)
ACT 196 will recheck in 1 hour

## 2020-09-27 NOTE — Progress Notes (Signed)
Site area: Right groin a 6 french arterial sheath was removed  Site Prior to Removal:  Level 0  Pressure Applied For 25 MINUTES    Bedrest Beginning at 1900 X 4 hours  Manual:   Yes.    Patient Status During Pull:  stable  Post Pull Groin Site:  Level 0  Post Pull Instructions Given:  Yes.    Post Pull Pulses Present:  Yes.    Dressing Applied:  Yes.    Comments:  Care instruction given to patient  Jacques Navy  Reviewed site.

## 2020-09-27 NOTE — Progress Notes (Signed)
Pt complaining of chest pain worse than usual.  O2 started at 2l/min. EKG obtained,  Nitrostat X1 given.  Vin, [place] notified , continue to monitor pain.

## 2020-09-27 NOTE — Progress Notes (Signed)
Patient transferred from cath lab at 1610hrs.  Oriented to unit and plan of care for shift.  Right femoral sheath intact, no bleeding or hematoma noted.

## 2020-09-28 ENCOUNTER — Other Ambulatory Visit (HOSPITAL_BASED_OUTPATIENT_CLINIC_OR_DEPARTMENT_OTHER): Payer: Self-pay

## 2020-09-28 DIAGNOSIS — K7581 Nonalcoholic steatohepatitis (NASH): Secondary | ICD-10-CM | POA: Diagnosis not present

## 2020-09-28 DIAGNOSIS — I10 Essential (primary) hypertension: Secondary | ICD-10-CM | POA: Diagnosis not present

## 2020-09-28 DIAGNOSIS — R0789 Other chest pain: Secondary | ICD-10-CM | POA: Diagnosis not present

## 2020-09-28 DIAGNOSIS — E782 Mixed hyperlipidemia: Secondary | ICD-10-CM | POA: Diagnosis not present

## 2020-09-28 DIAGNOSIS — I2510 Atherosclerotic heart disease of native coronary artery without angina pectoris: Secondary | ICD-10-CM | POA: Diagnosis not present

## 2020-09-28 DIAGNOSIS — I25118 Atherosclerotic heart disease of native coronary artery with other forms of angina pectoris: Secondary | ICD-10-CM | POA: Diagnosis not present

## 2020-09-28 DIAGNOSIS — Z885 Allergy status to narcotic agent status: Secondary | ICD-10-CM | POA: Diagnosis not present

## 2020-09-28 DIAGNOSIS — K7460 Unspecified cirrhosis of liver: Secondary | ICD-10-CM | POA: Diagnosis not present

## 2020-09-28 DIAGNOSIS — Z882 Allergy status to sulfonamides status: Secondary | ICD-10-CM | POA: Diagnosis not present

## 2020-09-28 DIAGNOSIS — E119 Type 2 diabetes mellitus without complications: Secondary | ICD-10-CM | POA: Diagnosis not present

## 2020-09-28 LAB — CBC
HCT: 33.7 % — ABNORMAL LOW (ref 39.0–52.0)
Hemoglobin: 11.4 g/dL — ABNORMAL LOW (ref 13.0–17.0)
MCH: 30.1 pg (ref 26.0–34.0)
MCHC: 33.8 g/dL (ref 30.0–36.0)
MCV: 88.9 fL (ref 80.0–100.0)
Platelets: 114 10*3/uL — ABNORMAL LOW (ref 150–400)
RBC: 3.79 MIL/uL — ABNORMAL LOW (ref 4.22–5.81)
RDW: 16.2 % — ABNORMAL HIGH (ref 11.5–15.5)
WBC: 5.5 10*3/uL (ref 4.0–10.5)
nRBC: 0 % (ref 0.0–0.2)

## 2020-09-28 LAB — BASIC METABOLIC PANEL
Anion gap: 4 — ABNORMAL LOW (ref 5–15)
BUN: 13 mg/dL (ref 8–23)
CO2: 26 mmol/L (ref 22–32)
Calcium: 8.6 mg/dL — ABNORMAL LOW (ref 8.9–10.3)
Chloride: 109 mmol/L (ref 98–111)
Creatinine, Ser: 1.5 mg/dL — ABNORMAL HIGH (ref 0.61–1.24)
GFR, Estimated: 52 mL/min — ABNORMAL LOW (ref >60)
Glucose, Bld: 123 mg/dL — ABNORMAL HIGH (ref 70–99)
Potassium: 4 mmol/L (ref 3.5–5.1)
Sodium: 139 mmol/L (ref 135–145)

## 2020-09-28 LAB — GLUCOSE, CAPILLARY
Glucose-Capillary: 155 mg/dL — ABNORMAL HIGH (ref 70–99)
Glucose-Capillary: 147 mg/dL — ABNORMAL HIGH (ref 70–99)

## 2020-09-28 MED ORDER — ASPIRIN 81 MG PO TBEC: 81.0000 mg | DELAYED_RELEASE_TABLET | Freq: Every day | ORAL | 11 refills | Status: DC

## 2020-09-28 MED ORDER — AMLODIPINE BESYLATE 5 MG PO TABS: 5.0000 mg | ORAL_TABLET | Freq: Every day | ORAL | Status: DC

## 2020-09-28 MED ORDER — AMLODIPINE BESYLATE 5 MG PO TABS
5.0000 mg | ORAL_TABLET | Freq: Every day | ORAL | 1 refills | Status: DC
  Filled 2020-09-28: qty 90, 90d supply, fill #0
  Filled 2020-12-24: qty 90, 90d supply, fill #1

## 2020-09-28 MED ORDER — ASPIRIN EC 81 MG PO TBEC: 81.0000 mg | DELAYED_RELEASE_TABLET | Freq: Every day | ORAL | Status: DC

## 2020-09-28 MED ORDER — ATORVASTATIN CALCIUM 80 MG PO TABS
80.0000 mg | ORAL_TABLET | Freq: Every day | ORAL | 1 refills | Status: DC
  Filled 2020-09-28 (×2): qty 90, 90d supply, fill #0
  Filled 2020-12-24: qty 90, 90d supply, fill #1

## 2020-09-28 NOTE — Discharge Summary (Addendum)
Discharge Summary    Patient ID: FRANKE MENTER MRN: [id number]; DOB: February 02, 1956  Admit date: 09/27/2020 Discharge date: 09/28/2020  PCP:  Colon Branch, [place]  Cardiologist:  Jenne Campus, MD   Discharge Diagnoses    Principal Problem:   CAD (coronary artery disease) Active Problems:   Dyslipidemia   Essential hypertension   Diagnostic Studies/Procedures    Cardiac Catheterization: 09/24/2020  Ost LAD to Prox LAD lesion is 50% stenosed.  1st Diag lesion is 75% stenosed.  2nd Diag lesion is 75% stenosed.  Mid LAD lesion is 99% stenosed.  Mid Cx lesion is 75% stenosed.  RPAV lesion is 50% stenosed.  RPDA lesion is 25% stenosed.  The left ventricular systolic function is normal.  LV end diastolic pressure is normal.  The left ventricular ejection fraction is 55-65% by visual estimate.  There is no aortic valve stenosis.   Plan for staged PCI of the mid LAD.  The diagonal vessels and circumflex are very small and will be managed medically.  Will need to use groin approach given the tortuosity of his right subclavian.  Start clopidogrel 300 mg x 1 today followed by 75 mg daily.    Coronary Stent Intervention: 09/27/2020   Ost LAD to Prox LAD lesion is 50% stenosed. This did not appear to be significant by IVUS during pullback.  1st Diag lesion is 75% stenosed.  2nd Diag lesion is 75% stenosed.  Mid Cx lesion is 75% stenosed.  Mid LAD lesion is 99% stenosed. This appeared to be a chronic total occlusion.  A drug-eluting stent was successfully placed using a SYNERGY XD [id number], postdilated to 3.0 mm and optimized with intravascular ultrasound.  Post intervention, there is a 0% residual stenosis.  LV end diastolic pressure is mildly elevated.  There is no aortic valve stenosis.   Continue aggressive secondary prevention including dual antiplatelet therapy.  Consider clopidogrel monotherapy given his diffuse  disease.  History of Present Illness     RITIK STAVOLA is a 65 y.o. male with past medical history of HTN, HLD, Type 2 DM and nonalcoholic liver cirrhosis who presented to [facility] on 09/24/2020 for planned cardiac catheterization.   He was last examined by Dr. Agustin Cree on 09/16/2020 and reported episodes of chest pain at rest and with activity. Had recently underwent a Coronary CT which showed two areas of significant stenosis by FFR in the LAD and RCA. He was started on Imdur 74m daily and a cardiac catheterization was recommended for definitive evaluation.   He underwent initial catheterization on 09/24/2020 which showed 50% Ost LAD, 99% mid-LAD, 75% 1st Diag, 75% 2nd Diag, 75% mid-LCx, 50% RPAV and 25% RPDA, Staged PCI of the LAD was recommended with medical management of the diagonal and LCx vessels recommended given their small size. He was given Plavix 3064mand started on Plavix 7532maily and discharged home to return on 4/1 for planned PCI.    Hospital Course     Consultants: None   He underwent staged PCI on 09/27/2020 with DES to 99% mid-LAD which appeared to be a CTO. LVEDP was mildly elevated. Was recommended to continue DAPT for 1 year then consider Plavix as monotherapy afterwards given his diffuse CAD. His Atorvastatin was titrated to 39m107mily and he will need repeat FLP and LFT's in 6-8 weeks.   The following morning, he denied any recurrent exertional chest pain. Reported occasional twinges of discomfort lasting for seconds at a  time but different from his presenting symptoms. He ambulated over 240 ft with cardiac rehab and BP was elevated but he had not yet received his morning medications. BP remained elevated following this, therefore Amlodipine 40m daily was added to his medication regimen. Could consider Spironolactone as an outpatient given his cirrhosis but was not added at discharge given his variable renal function. Was in NSR on telemetry with no significant  arrhythmias. Follow-up labs showed creatinine at 1.50 which is consistent with prior values and Hgb was at 11.4. He was deemed stable for discharge and he has scheduled follow-up in 4 weeks.     Discharge Physical Exam and Vitals Blood pressure (!) 143/97, pulse 63, temperature 98.2 F (36.8 C), temperature source Oral, resp. rate 20, height 5' 11"  (1.803 m), weight 113.4 kg, SpO2 95 %.  Filed Weights   09/27/20 0820  Weight: 113.4 kg   General: Pleasant male appearing in NAD Psych: Normal affect. Neuro: Alert and oriented X 3. Moves all extremities spontaneously. HEENT: Normal  Neck: Supple without bruits or JVD. Lungs:  Resp regular and unlabored, CTA without wheezing or rales. Heart: RRR no s3, s4, or murmurs. Abdomen: Soft, non-tender, non-distended, BS + x 4.  Extremities: No clubbing, cyanosis or edema. DP/PT/Radials 2+ and equal bilaterally. Ecchymosis along right groin site but no hematoma.    Labs & Radiologic Studies    CBC Recent Labs    09/27/20 0845 09/28/20 0254  WBC 5.2 5.5  HGB 13.1 11.4*  HCT 38.4* 33.7*  MCV 88.1 88.9  PLT 124* 1536   Basic Metabolic Panel Recent Labs    09/27/20 0845 09/28/20 0254  NA 139 139  K 4.0 4.0  CL 107 109  CO2 26 26  GLUCOSE 122* 123*  BUN 13 13  CREATININE 1.48* 1.50*  CALCIUM 8.9 8.6*   Liver Function Tests No results for input(s): AST, ALT, ALKPHOS, BILITOT, PROT, ALBUMIN in the last 72 hours. No results for input(s): LIPASE, AMYLASE in the last 72 hours. High Sensitivity Troponin:   No results for input(s): TROPONINIHS in the last 720 hours.  BNP Invalid input(s): POCBNP D-Dimer No results for input(s): DDIMER in the last 72 hours. Hemoglobin A1C Recent Labs    09/27/20 0845  HGBA1C 6.8*   Fasting Lipid Panel No results for input(s): CHOL, HDL, LDLCALC, TRIG, CHOLHDL, LDLDIRECT in the last 72 hours. Thyroid Function Tests No results for input(s): TSH, T4TOTAL, T3FREE, THYROIDAB in the last 72  hours.  Invalid input(s): FREET3 _____________  DG Chest 2 View  Result Date: 09/18/2020 CLINICAL DATA:  Pre cath.  Coronary artery disease. EXAM: CHEST - 2 VIEW COMPARISON:  September 19, 2018 FINDINGS: Again noted is posterior fusion hardware of the upper thoracic spine. There is a probable VP shunt coursing along the patient's left chest wall. The heart size is stable. There are probable aortic calcifications. No pneumothorax. No large pleural effusion. No definite acute osseous abnormality detected. IMPRESSION: No active cardiopulmonary disease. Electronically Signed   By: CConstance HolsterM.D.   On: 09/18/2020 12:50   CT CORONARY MORPH W/CTA COR W/SCORE W/CA W/CM &/OR WO/CM  Addendum Date: 09/04/2020   ADDENDUM REPORT: 09/04/2020 17:40 CLINICAL DATA:  65year-old White Male EXAM: Cardiac/Coronary  CTA TECHNIQUE: The patient was scanned on a [employer] FINDINGS: A 100 kV prospective scan was triggered in the descending thoracic aorta at 111 HU's. Axial non-contrast 3 mm slices were carried out through the heart. The data set was analyzed on  a dedicated work station and scored using the [employer]. Gantry rotation speed was 250 msecs and collimation was .6 mm. No beta blockade and 0.8 mg of sl NTG was given. The 3D data set was reconstructed in 5% intervals of the 67-82 % of the R-R cycle. Diastolic phases were analyzed on a dedicated work station using MPR, MIP and VRT modes. The patient received 100 cc of contrast. Aorta:  Normal size.  No calcifications.  No dissection. Aortic Valve:  Tri-leaflet.  Mild leaflet calcification. Coronary Arteries:  Normal coronary origin. Right dominance. Coronary calcium score of 1693. This was 98th percentile for age, sex, and race matched control. RCA is a large dominant artery that gives rise to PDA and PLA. There is a mild non-obstructive (25-44%) soft plaque in the proximal vessel. There are multiple mild non-obstructive (25-44%) calcified plaques in  the mid vessel. There is a mild non-obstructive (25-44%) calcified plaque in the PDA. There is a moderate stenosis (50-70%%) mixed plaque in the distal vessel. Left main is a large artery that gives rise to LAD and LCX arteries. There is no plaque. LAD is a large vessel that gives rise to two diagonal vessels. There is a long (18 mm) moderate stenosis (50-70%) calcified plaque in the proximal vessel. There is a mild non-obstructive (25-44%) mixed plaque in the mid vessel with a napkin ring's sign (high risk feature) in the mid vessel. LCX is a non-dominant artery. There is a mild non-obstructive (25-49%) soft plaque in the mid vessel. There is a ramus intermedius vessel with mild non-obstructive (25-49%) calcified plaque in the ostium of the vessel. Other findings: Normal pulmonary vein drainage into the left atrium. Normal left atrial appendage without a thrombus. Normal size of the pulmonary artery. Mild dilation of the coronary sinus: 12 mm. Extra-cardiac findings: See attached radiology report for non-cardiac structures. IMPRESSION: 1. Coronary calcium score of 1693. This was 98th percentile for age, sex, and race matched control. 2. Normal coronary origin. Right dominance. 3. CAD-RADS 3. Moderate stenosis of multiple vessels. Consider symptom-guided anti-ischemic pharmacotherapy as well as risk factor modification per guideline directed care. Additional analysis with CT FFR will be submitted. 4. Mild dilation of the coronary sinus: 12 mm. Electronically Signed   By: Rudean Haskell MD   On: 09/04/2020 17:40   Result Date: 09/04/2020 EXAM: OVER-READ INTERPRETATION  CT CHEST The following report is an over-read performed by radiologist Dr. Vinnie Langton of [facility], [place] on 09/04/2020. This over-read does not include interpretation of cardiac or coronary anatomy or pathology. The coronary calcium score/coronary CTA interpretation by the cardiologist is attached. COMPARISON:  None. FINDINGS:  Atherosclerotic calcifications in the thoracic aorta. Within the visualized portions of the thorax there are no suspicious appearing pulmonary nodules or masses, there is no acute consolidative airspace disease, no pleural effusions, no pneumothorax and no lymphadenopathy. Visualized portions of the upper abdomen are unremarkable. There are no aggressive appearing lytic or blastic lesions noted in the visualized portions of the skeleton. Orthopedic fixation rod device in the upper thoracic spine incompletely imaged. Small bore catheter in the subcutaneous fat of the anterior left chest wall, likely ventriculoperitoneal shunt catheter. IMPRESSION: 1.  Aortic Atherosclerosis (ICD10-I70.0). Electronically Signed: By: Vinnie Langton M.D. On: 09/04/2020 16:07   CT CORONARY FRACTIONAL FLOW RESERVE DATA PREP  Result Date: 09/05/2020 EXAM: CT-FFR ANALYSIS CLINICAL DATA:  Multi-vessel disease FINDINGS: CT-FFR analysis was performed on the original cardiac CT angiogram dataset. Diagrammatic representation of the CT-FFR analysis is  provided in a separate PDF document in PACS. This dictation was created using the PDF document and an interactive 3D model of the results. 3D model is not available in the EMR/PACS. Normal FFR range is >0.80. 1. Left Main: No significant functional stenosis, CT-FFR 0.99 distal vessel. 2. LAD: Significant functional stenosis, CT-FFR 0.53 at mid-distal lesion. 3. LCX: No significant functional stenosis, CT-FFR 0.87 distal vessel. 4. RI: No significant functional stenosis, CT-FFR 0.91 distal vessel. 5. RCA: Significant functional stenosis, CT-FFR 0.74 at small marginal take off. IMPRESSION: 1. CT FFR analysis shows two areas of significant functional stenosis. Rudean Haskell MD Electronically Signed   By: Rudean Haskell MD   On: 09/05/2020 13:12   Disposition   Pt is being discharged home today in good condition.  Follow-up Plans & Appointments     Follow-up Information     Park Liter, MD Follow up on 10/30/2020.   Specialty: Cardiology Why: Keep scheduled follow-up on 10/30/2020 at 9:00 AM.  Contact information: [place] [id number] [phone]              Discharge Instructions    Amb Referral to Cardiac Rehabilitation   Complete by: As directed    Diagnosis: Coronary Stents   After initial evaluation and assessments completed: Virtual Based Care may be provided alone or in conjunction with Phase 2 Cardiac Rehab based on patient barriers.: Yes   Diet - low sodium heart healthy   Complete by: As directed    Discharge instructions   Complete by: As directed    PLEASE REMEMBER TO BRING ALL OF YOUR MEDICATIONS TO EACH OF [place].  PLEASE ATTEND ALL SCHEDULED FOLLOW-UP APPOINTMENTS.   Activity: Increase activity slowly as tolerated. You may shower, but no soaking baths (or swimming) for 1 week. No driving for 24 hours. No lifting over 5 lbs for 1 week. No sexual activity for 1 week.   You May Return to Work: in 1 week (if applicable)  Wound Care: You may wash cath site gently with soap and water. Keep cath site clean and dry. If you notice pain, swelling, bleeding or pus at your cath site, please call [phone].   Increase activity slowly   Complete by: As directed       Discharge Medications   Allergies as of 09/28/2020      Reactions   Bee Venom Anaphylaxis   Hydrocodone-homatropine Other (See Comments)   Hallucinations, confusion, delirium Depressed feeling   Toradol [ketorolac Tromethamine] Other (See Comments)   Hallucinations, confusion, delirium   Morphine And Related Other (See Comments)   Hallucinations, back in the 80s. States has taken vicodin before w/o problems    Sulfa Drugs Cross Reactors Rash      Medication List    TAKE these medications   aspirin 81 MG EC tablet Take 1 tablet (81 mg total) by mouth daily. Swallow whole. Start taking on: September 29, 2020    atorvastatin 80 MG tablet Commonly known as: LIPITOR Take 1 tablet (80 mg total) by mouth at bedtime. What changed:   medication strength  how much to take   carvedilol 12.5 MG tablet Commonly known as: COREG TAKE 1 TABLET (12.5 MG TOTAL) BY MOUTH 2 (TWO) TIMES DAILY WITH A MEAL.   clonazePAM 0.5 MG tablet Commonly known as: KLONOPIN TAKE 1 TABLET BY MOUTH EVERY NIGHT AT BEDTIME   clopidogrel 75 MG tablet Commonly known as: Plavix Take 1 tablet (75 mg  total) by mouth daily.   DULoxetine 60 MG capsule Commonly known as: CYMBALTA TAKE 2 CAPSULES (120 MG TOTAL) BY MOUTH DAILY. What changed:   how much to take  how to take this  when to take this   EPINEPHrine 0.3 mg/0.3 mL Soaj injection Commonly known as: EpiPen 2-Pak Inject 0.3 mLs (0.3 mg total) into the muscle once as needed for up to 1 dose for anaphylaxis.   fenofibrate micronized 134 MG capsule Commonly known as: LOFIBRA TAKE 1 CAPSULE BY MOUTH THREE TIMES WEEKLY What changed:   how much to take  how to take this  when to take this   FreeStyle Libre 14 Day Sensor Misc USE AS DIRECTED TO CHECK BLOOD SUGAR   gabapentin 600 MG tablet Commonly known as: NEURONTIN TAKE 1 TABLET (600 MG TOTAL) BY MOUTH 2 (TWO) TIMES DAILY.   glimepiride 4 MG tablet Commonly known as: AMARYL TAKE 1 TABLET BY MOUTH ONCE DAILY BEFORE BREAKFAST What changed:   how much to take  when to take this   HumaLOG 100 UNIT/ML injection Generic drug: insulin lispro USE PUMP TO INJECT 50 UNITS DAILY What changed:   when to take this  additional instructions   Humira Pen 40 MG/0.8ML Pnkt Generic drug: Adalimumab INJECT 40 MG (0.8 ML) UNDER THE SKIN EVERY OTHER WEEK.   Humira Pen 40 MG/0.8ML Pnkt Generic drug: Adalimumab INJECT 40 MG (0.8 ML) UNDER THE SKIN EVERY OTHER WEEK SUBCUTANEOUS EVERY OTHER WEEK 28 DAYS   isosorbide mononitrate 30 MG 24 hr tablet Commonly known as: IMDUR TAKE 1 TABLET BY MOUTH ONCE DAILY    nitroGLYCERIN 0.4 MG SL tablet Commonly known as: NITROSTAT PLACE 1 TABLETS UNDER THE TONGUE EVERY 5 MINS AS NEEDED FOR CHEST PAIN   OmniPod Dash 5 Pack Pods Misc CHANGE EVERY 72 HOURS AS DIRECTED   OmniPod Dash 5 Pack Pods Misc CHANGE EVERY 72 HOURS AS DIRECTED   Ozempic (0.25 or 0.5 MG/DOSE) 2 MG/1.5ML Sopn Generic drug: Semaglutide(0.25 or 0.5MG/DOS) INJECT 0.5MG INTO THE SKIN ONCE A WEEK   pantoprazole 40 MG tablet Commonly known as: Protonix Take 1 tablet (40 mg total) by mouth daily.          Outstanding Labs/Studies   FLP and LFT's in 6-8 weeks.   Duration of Discharge Encounter   Greater than 30 minutes including physician time.  Signed, Erma Heritage, PA-C 09/28/2020, 12:21 PM  Patient seen and examined with [place] PA-C.  Agree as above, with the following exceptions and changes as noted below. Patient has occasional chest twinges, we discussed use of SL nitro. Gen: NAD, CV: RRR, no murmurs, cath site stable, Lungs: clear, Abd: soft, Extrem: Warm, well perfused, no edema, Neuro/Psych: alert and oriented x 3, normal mood and affect. All available labs, radiology testing, previous records reviewed. Will add amlodipine for persistently elevated BP which may be contributing to chest twinges. Consider uptitration of imdur as well. Could add spironolactone as outpatient for history of cirrhosis, if renal function can be monitored.  Elouise Munroe, MD 09/28/20 1:01 PM

## 2020-09-28 NOTE — Progress Notes (Signed)
CARDIAC REHAB PHASE I   PRE:  Rate/Rhythm: 67 SR  BP:  Supine: 157/87 Sitting:   Standing:    SaO2: 95% RA  MODE:  Ambulation: 240 ft   POST:  Rate/Rhythm: 81 SR  BP:  Supine:   Sitting: 157/98  Standing:    SaO2: 95% RA  [id number] Patient tolerated ambulation fair with assist x1. Patient c/o feeling lightheaded during walk, BP elevated, resolved with rest. Informed patient's RN about symptoms. To chair after ambulation and assisted to bed after education.  PCI education completed with patient including restrictions, ASA and Plavix use, CP, NTG use, and calling 911, risk factor modification and activity progression. Patient needs reinforcement with heart healthy/diabetic diet. Nutrition and exercise handouts given. Discussed phase 2 cardiac rehab, and patient is interested in the program at [facility]. Referral sent.  Sol Passer, MS, ACSM CEP

## 2020-09-30 ENCOUNTER — Encounter (HOSPITAL_COMMUNITY): Payer: Self-pay | Admitting: Interventional Cardiology

## 2020-10-01 ENCOUNTER — Ambulatory Visit: Payer: 59 | Admitting: Endocrinology

## 2020-10-08 ENCOUNTER — Telehealth (HOSPITAL_COMMUNITY): Payer: Self-pay

## 2020-10-08 ENCOUNTER — Encounter: Payer: Self-pay | Admitting: Internal Medicine

## 2020-10-08 NOTE — Telephone Encounter (Signed)
Pt insurance is active and benefits verified through [place], DED $3,000/$3,000 met, out of pocket $4,000/$4,000 met, co-insurance 0%. no pre-authorization required. Passport, [place]/[facility] 10/08/2020_0 :33pm, REF# [id number]  Will contact patient to see if he is interested in the Cardiac Rehab Program. If interested, patient will need to complete follow up appt. Once completed, patient will be contacted for scheduling upon review by the RN Navigator.

## 2020-10-08 NOTE — Telephone Encounter (Signed)
Attempted to call patient in regards to Cardiac Rehab - LM on VM 

## 2020-10-16 ENCOUNTER — Ambulatory Visit: Payer: 59 | Admitting: Endocrinology

## 2020-10-17 ENCOUNTER — Other Ambulatory Visit (HOSPITAL_COMMUNITY): Payer: Self-pay

## 2020-10-17 MED FILL — Adalimumab Auto-injector Kit 40 MG/0.8ML: SUBCUTANEOUS | 28 days supply | Qty: 2 | Fill #0 | Status: AC

## 2020-10-20 ENCOUNTER — Other Ambulatory Visit: Payer: Self-pay | Admitting: Endocrinology

## 2020-10-20 MED FILL — Continuous Glucose System Sensor: 28 days supply | Qty: 2 | Fill #0 | Status: AC

## 2020-10-21 ENCOUNTER — Other Ambulatory Visit (HOSPITAL_BASED_OUTPATIENT_CLINIC_OR_DEPARTMENT_OTHER): Payer: Self-pay

## 2020-10-21 MED ORDER — OMNIPOD DASH PODS (GEN 4) MISC
0 refills | Status: DC
  Filled 2020-10-21: qty 10, 30d supply, fill #0

## 2020-10-22 ENCOUNTER — Other Ambulatory Visit (HOSPITAL_COMMUNITY): Payer: Self-pay

## 2020-10-22 ENCOUNTER — Telehealth: Payer: Self-pay

## 2020-10-22 ENCOUNTER — Ambulatory Visit (INDEPENDENT_AMBULATORY_CARE_PROVIDER_SITE_OTHER): Payer: 59 | Admitting: Neurology

## 2020-10-22 ENCOUNTER — Encounter: Payer: Self-pay | Admitting: Neurology

## 2020-10-22 ENCOUNTER — Other Ambulatory Visit (HOSPITAL_BASED_OUTPATIENT_CLINIC_OR_DEPARTMENT_OTHER): Payer: Self-pay

## 2020-10-22 VITALS — BP 128/76 | HR 77 | Ht 71.0 in | Wt 251.0 lb

## 2020-10-22 DIAGNOSIS — G43709 Chronic migraine without aura, not intractable, without status migrainosus: Secondary | ICD-10-CM | POA: Diagnosis not present

## 2020-10-22 DIAGNOSIS — G4733 Obstructive sleep apnea (adult) (pediatric): Secondary | ICD-10-CM | POA: Diagnosis not present

## 2020-10-22 DIAGNOSIS — G911 Obstructive hydrocephalus: Secondary | ICD-10-CM | POA: Diagnosis not present

## 2020-10-22 DIAGNOSIS — I2510 Atherosclerotic heart disease of native coronary artery without angina pectoris: Secondary | ICD-10-CM

## 2020-10-22 DIAGNOSIS — G4752 REM sleep behavior disorder: Secondary | ICD-10-CM

## 2020-10-22 NOTE — Telephone Encounter (Signed)
Pt here today w/ son Sheridan Gettel- he needed handicap placard form renewed. Form completed and original given back to Pt. Copy sent for scanning.

## 2020-10-22 NOTE — Addendum Note (Signed)
Addended by: Arlice Colt A on: 10/22/2020 11:20 PM   Modules accepted: Level of Service

## 2020-10-22 NOTE — Progress Notes (Signed)
[facility]  PATIENT: James [name] DOB: [birth year]  REFERRING DOCTOR OR PCP:  Kathlene November  SOURCE: patient, notes from Dr. [name], imaging results and MRI and CT scans on PACS personally reviewed  _________________________________   HISTORICAL  CHIEF COMPLAINT:  Chief Complaint  Patient presents with  . Botulinum Toxin Injection    RM 12. Last seen 07/23/2020. Here for botox for migraines. 200U/1vial. Lot: [id number]. Expiration: 04/2023. [place]: [id number]. Had heart stent placed about 4 weeks ago, doing well. Has follow up with Cardiology 10/30/20 at [facility] w/ Dr. Agustin Cree    HISTORY OF PRESENT ILLNESS:  James [name] is a 65 y.o. man with idiopathic intracranial hypertension and recent increase in the frequency and severity of headache  Update 10/22/2020:  Since the last visit, he has had 3 cardiac stents after being diagnosed with CAD after presenting with chest pain.   He had a small MI.    He is doing very well after the last Botox series in January 2022.  Has had only a few HA this month.  He denies much neck pain (better)  Pain is pressure like mostly.  It is worse on the right.  They are associated with nausea at times.  When the headaches more severe he has photophobia and phonophobia. Sometimes, headaches improve when he is laying down but other times they do not change between sitting and lying down. Moving usually worsens the headaches.  He did not get a benefit from multiple prophylactic medications including Keppra, gabapentin, Emgality, Cymbalta.    He has OSA.  He was unable to tolerate CPAP.  We did discuss other possible treatments but he is not interested at this time.   He has excessive daytime sleepiness and nocturnal insomnia.    He has had some RBD with acticve dreams - with some kicking but not getting out of bed.     Chronic Migraine History: He was having daily HA with migrainous features.   He did not get a benefit from multiple prophylactic  medications including Keppra, gabapentin, Emgality, Cymbalta.   Botox therapy started July 2021.  Spine: L-spine MRI 08/15/18 showed progressive epidural lipomatosis in the lower lumbar spine with progressive compression of the thecal sac and spinal stenosis at L3-4, L4-5, and L5-S1 compared to the prior study. Progressive facet degeneration and anterolisthesis at L4-5 since 2011.   MRI of the cervical spine performed 04/27/2017 showed  thoracic fusion hardware from C7 and below. There is mild spondylosis and disc bulging at C3-C4 through C6-C7. There does not appear to be any significant foraminal narrowing and there is no spinal stenosis.   Idiopthic intracranial Hypertension: He was diagnosed with idiopathic intracranial hypertension many years ago and had a VP shunt placed in 2007. A revision was performed July 2007 due to infection. He has a programmable Medtronic valve. Due to the increased headaches, it was reprogrammed from 1.5-1.0. Tapping of the shunt showed a pressure 160 (was drained to 140 mm). There was no infection. He also had an MRI of the brain and MR venogram. CT had shown the left-sided ventricular catheter extends into the right caudate head, similar to the previous study the MR venogram (11/12/2016) was reportedly normal. The left transverse and sigmoid sinuses are not well evaluated due to the shunt reservoir (but also likely he is right dominant).   CT head 3/13 2020 personally reviewed and no evidence of shunt failure        REVIEW OF SYSTEMS: Constitutional: No fevers,  chills, sweats, or change in appetite Eyes: No visual changes, double vision, eye pain Ear, nose and throat: No hearing loss, ear pain, nasal congestion, sore throat Cardiovascular: No chest pain, palpitations Respiratory: No shortness of breath at rest or with exertion.   No wheezes GastrointestinaI: No nausea, vomiting, diarrhea, abdominal pain, fecal incontinence Genitourinary: No dysuria, urinary  retention or frequency.  No nocturia. Musculoskeletal: as above Integumentary: psoriasis on Humira Neurological: as above Psychiatric: No depression at this time.  No anxiety Endocrine: No palpitations, diaphoresis, change in appetite, change in weigh or increased thirst Hematologic/Lymphatic: No anemia, purpura, petechiae. Allergic/Immunologic: No itchy/runny eyes, nasal congestion, recent allergic reactions, rashes  ALLERGIES: Allergies  Allergen Reactions  . Bee Venom Anaphylaxis  . Hydrocodone-Homatropine Other (See Comments)    Hallucinations, confusion, delirium Depressed feeling  . Toradol [Ketorolac Tromethamine] Other (See Comments)    Hallucinations, confusion, delirium  . Morphine And Related Other (See Comments)    Hallucinations, back in the 80s. States has taken vicodin before w/o problems   . Sulfa Drugs Cross Reactors Rash    HOME MEDICATIONS:  Current Outpatient Medications:  .  Adalimumab 40 MG/0.8ML PNKT, INJECT 40 MG (0.8 ML) UNDER THE SKIN EVERY OTHER WEEK., Disp: 2 each, Rfl: 5 .  Adalimumab 40 MG/0.8ML PNKT, INJECT 40 MG (0.8 ML) UNDER THE SKIN EVERY OTHER WEEK SUBCUTANEOUS EVERY OTHER WEEK 28 DAYS, Disp: 2 each, Rfl: 5 .  amLODipine (NORVASC) 5 MG tablet, Take 1 tablet (5 mg total) by mouth daily., Disp: 90 tablet, Rfl: 1 .  aspirin EC 81 MG EC tablet, Take 1 tablet (81 mg total) by mouth daily. Swallow whole., Disp: 30 tablet, Rfl: 11 .  atorvastatin (LIPITOR) 80 MG tablet, Take 1 tablet (80 mg total) by mouth at bedtime., Disp: 90 tablet, Rfl: 1 .  carvedilol (COREG) 12.5 MG tablet, TAKE 1 TABLET (12.5 MG TOTAL) BY MOUTH 2 (TWO) TIMES DAILY WITH A MEAL., Disp: 180 tablet, Rfl: 1 .  clonazePAM (KLONOPIN) 0.5 MG tablet, TAKE 1 TABLET BY MOUTH EVERY NIGHT AT BEDTIME, Disp: 30 tablet, Rfl: 3 .  clopidogrel (PLAVIX) 75 MG tablet, Take 1 tablet (75 mg total) by mouth daily., Disp: 90 tablet, Rfl: 3 .  clopidogrel (PLAVIX) 75 MG tablet, TAKE 1 TABLET (75 MG  TOTAL) BY MOUTH DAILY., Disp: 90 tablet, Rfl: 3 .  Continuous Blood Gluc Sensor (FREESTYLE LIBRE 14 DAY SENSOR) MISC, USE AS DIRECTED TO CHECK BLOOD SUGAR, Disp: 2 each, Rfl: 3 .  DULoxetine (CYMBALTA) 60 MG capsule, TAKE 2 CAPSULES (120 MG TOTAL) BY MOUTH DAILY. (Patient taking differently: Take 60 mg by mouth 2 (two) times daily.), Disp: 180 capsule, Rfl: 1 .  EPINEPHrine (EPIPEN 2-PAK) 0.3 mg/0.3 mL IJ SOAJ injection, Inject 0.3 mLs (0.3 mg total) into the muscle once as needed for up to 1 dose for anaphylaxis., Disp: 2 each, Rfl: 2 .  fenofibrate micronized (LOFIBRA) 134 MG capsule, TAKE 1 CAPSULE BY MOUTH THREE TIMES WEEKLY (Patient taking differently: Take 134 mg by mouth 3 (three) times a week.), Disp: 15 capsule, Rfl: 3 .  gabapentin (NEURONTIN) 600 MG tablet, TAKE 1 TABLET (600 MG TOTAL) BY MOUTH 2 (TWO) TIMES DAILY., Disp: 180 tablet, Rfl: 1 .  glimepiride (AMARYL) 4 MG tablet, TAKE 1 TABLET BY MOUTH ONCE DAILY BEFORE BREAKFAST (Patient taking differently: Take 4 mg by mouth daily with breakfast.), Disp: 30 tablet, Rfl: 3 .  Insulin Disposable Pump (OMNIPOD DASH 5 PACK PODS) MISC, CHANGE EVERY 72 HOURS AS DIRECTED,  Disp: 10 each, Rfl: 0 .  insulin lispro (HUMALOG) 100 UNIT/ML injection, USE PUMP TO INJECT 50 UNITS DAILY (Patient taking differently: See admin instructions. 50 units daily in pump), Disp: 50 mL, Rfl: 1 .  isosorbide mononitrate (IMDUR) 30 MG 24 hr tablet, TAKE 1 TABLET BY MOUTH ONCE DAILY, Disp: 90 tablet, Rfl: 1 .  nitroGLYCERIN (NITROSTAT) 0.4 MG SL tablet, PLACE 1 TABLETS UNDER THE TONGUE EVERY 5 MINS AS NEEDED FOR CHEST PAIN, Disp: 30 tablet, Rfl: 5 .  pantoprazole (PROTONIX) 40 MG tablet, Take 1 tablet (40 mg total) by mouth daily., Disp: 90 tablet, Rfl: 3 .  pantoprazole (PROTONIX) 40 MG tablet, TAKE 1 TABLET (40 MG TOTAL) BY MOUTH DAILY., Disp: 90 tablet, Rfl: 3 .  Semaglutide,0.25 or 0.5MG/DOS, 2 MG/1.5ML SOPN, INJECT 0.5MG INTO THE SKIN ONCE A WEEK, Disp: 1.5 mL, Rfl:  2  PAST MEDICAL HISTORY: Past Medical History:  Diagnosis Date  . Acid reflux   . Annual physical exam 04/08/2015  . Arthritis   . Blood transfusion without reported diagnosis   . Chronic fatigue 01/28/2015  . Chronic headaches    on cymbalta  . Chronic migraine w/o aura, not intractable, w/o stat migr 10/24/2018  . Cirrhosis ([place])   . Colon polyps   . Complication of anesthesia    problems waking up from anesthesia  . Depression    on cymbalta  . Diabetes mellitus with neuropathy ([place])   . Diabetes with neuropathy 04/25/2013  . Diverticulitis 03/2013  . Dyslipidemia 05/29/2019  . Eczema   . Elevated LFTs   . Epidural lipomatosis 10/05/2018  . Essential (primary) hypertension 04/05/2019  . Fatty liver   . GERD (gastroesophageal reflux disease) 04/28/2011  . H/O craniotomy 05/07/2015  . Hepatitis 10/2017   NASH cirrhosis  . History of kidney stones   . Hyperlipidemia   . Hypertension   . IDA (iron deficiency anemia) 01/24/2019  . Insomnia 04/26/2013  . Kidney stone   . Liver cirrhosis secondary to NASH (nonalcoholic steatohepatitis) ([place]) 01/02/2016  . Low back pain 04/05/2019  . Lower back injury 08/14/2019  . Morbid obesity ([place])   . Neuromuscular disorder (HCC)    neuropathy  . Neuropathy   . Nonalcoholic steatohepatitis 4/0/9811  . Obstructive hydrocephalus ([place]) 01/28/2015  . OSA -- dx ~ 2012, cpap intolerant 09/04/2014    dx ~ 2012, cpap intolerant   . PCP NOTES >>> 04/08/2015  . Post-op pain 03/19/2019  . Post-traumatic hydrocephalus (HCC)    s/p shunts x 2 (first got infected )  . Presence of cerebrospinal fluid drainage device 07/28/2011  . Psoriasis    sees Dr [name]  . Psoriatic arthritis ([place])   . REM behavioral disorder 01/14/2017  . 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 SI (sacroiliac) joint dysfunction 08/14/2019  . Sigmoid diverticulitis 04/25/2013  . Sleep apnea    no CPAP     . Spondylolisthesis, lumbar region  03/16/2019  . Stomach ulcer   . Testosterone deficiency 04/28/2011  . VP (ventriculoperitoneal) shunt status 07/31/2020    PAST SURGICAL HISTORY: Past Surgical History:  Procedure Laterality Date  . BACK SURGERY  1980  . BRAIN SURGERY     VP shunts placed in 2007  . CHOLECYSTECTOMY N/A 08/25/2017   Procedure: LAPAROSCOPIC CHOLECYSTECTOMY WITH INTRAOPERATIVE CHOLANGIOGRAM;  Surgeon: [name], MD;  Location: [facility];  Service: General;  Laterality: N/A;  . COLONOSCOPY    . CORONARY STENT INTERVENTION N/A 09/27/2020  Procedure: CORONARY STENT INTERVENTION;  Surgeon: Jettie Booze, MD;  Location: [facility];  Service: Cardiovascular;  Laterality: N/A;  . INTRAVASCULAR ULTRASOUND/IVUS N/A 09/27/2020   Procedure: Intravascular Ultrasound/IVUS;  Surgeon: Jettie Booze, MD;  Location: [facility];  Service: Cardiovascular;  Laterality: N/A;  . JOINT REPLACEMENT     total hip  . LEFT HEART CATH N/A 09/27/2020   Procedure: Left Heart Cath;  Surgeon: Jettie Booze, MD;  Location: [facility];  Service: Cardiovascular;  Laterality: N/A;  . LEFT HEART CATH AND CORONARY ANGIOGRAPHY N/A 09/24/2020   Procedure: LEFT HEART CATH AND CORONARY ANGIOGRAPHY;  Surgeon: Jettie Booze, MD;  Location: [facility];  Service: Cardiovascular;  Laterality: N/A;  . LUMBAR FUSION  03/16/2019  . SHOULDER SURGERY Left 2010  . TOE SURGERY Left 2018  . TONSILLECTOMY     as a child  . TOTAL HIP ARTHROPLASTY Left 2011  . UPPER GASTROINTESTINAL ENDOSCOPY  01/04/2020  . VENTRICULOPERITONEAL SHUNT  2007   x2    FAMILY HISTORY: Family History  Problem Relation Age of Onset  . Other Mother   . Lung cancer Father        alive, former smoker   . Heart disease Brother        MI age 26  . Other Brother        Murdered  . Down syndrome Son   . Diabetes Neg Hx   . Prostate cancer Neg Hx   . Colon cancer Neg Hx   . Stomach cancer Neg Hx   . Pancreatic cancer Neg Hx    . Liver disease Neg Hx     SOCIAL HISTORY:  Social History   Socioeconomic History  . Marital status: Married    Spouse name: [name]  . Number of children: 2  . Years of education: Not on file  . Highest education level: Not on file  Occupational History  . Occupation: disabled   Tobacco Use  . Smoking status: Never Smoker  . Smokeless tobacco: Never Used  Vaping Use  . Vaping Use: Never used  Substance and Sexual Activity  . Alcohol use: No  . Drug use: No  . Sexual activity: Yes    Partners: Female  Other Topics Concern  . 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   Social Determinants of Health   Financial Resource Strain: Not on file  Food Insecurity: Not on file  Transportation Needs: Not on file  Physical Activity: Not on file  Stress: Not on file  Social Connections: Not on file  Intimate Partner Violence: Not on file     PHYSICAL EXAM  Vitals:   10/22/20 1025  BP: 128/76  Pulse: 77  Weight: 251 lb (113.9 kg)  Height: 5' 11"  (1.803 m)    Body mass index is 35.01 kg/m.   General: The patient is well-developed and well-nourished and in no acute distress,     Neck: He hasmild tenderness at the occiput bilaterally.    Skin: Extremities are without significant edema.  Neurologic Exam  Mental status: The patient is alert and oriented x 3 at the time of the examination. The patient has apparent normal recent and remote memory, with an apparently normal attention span and concentration ability.  Speech is normal.  Cranial nerves: Extraocular movements are full.  Facial strength and sensation is normal. OK neck strength  Motor:  Muscle bulk is normal.   Tone is normal. Strength is  5 / 5 in the arms and proximal legs and 4+/5 in the intrinsic foot muscles in both feet.    Coordination: Cerebellar testing shows good finger-nose-finger  Gait and station: Station is normal. Gait is arthritic. Tandem walk is wide. Romberg is negative.   Reflexes: Deep tendon reflexes are 1 and symmetric in the arms, 2 and symmetric at the knees and absent at the ankles.Marland Kitchen       DIAGNOSTIC DATA (LABS, IMAGING, TESTING) - I reviewed patient records, labs, notes, testing and imaging myself where available.  Lab Results  Component Value Date   WBC 5.5 09/28/2020   HGB 11.4 (L) 09/28/2020   HCT 33.7 (L) 09/28/2020   MCV 88.9 09/28/2020   PLT 114 (L) 09/28/2020      Component Value Date/Time   NA 139 09/28/2020 0254   NA 139 09/16/2020 1724   K 4.0 09/28/2020 0254   CL 109 09/28/2020 0254   CO2 26 09/28/2020 0254   GLUCOSE 123 (H) 09/28/2020 0254   BUN 13 09/28/2020 0254   BUN 21 09/16/2020 1724   CREATININE 1.50 (H) 09/28/2020 0254   CALCIUM 8.6 (L) 09/28/2020 0254   PROT 7.2 10/09/2019 1005   ALBUMIN 4.1 10/09/2019 1005   AST 29 06/11/2020 1030   ALT 32 06/11/2020 1030   ALKPHOS 100 10/09/2019 1005   BILITOT 0.7 10/09/2019 1005   GFRNONAA 52 (L) 09/28/2020 0254   GFRAA 66 08/08/2020 1442   Lab Results  Component Value Date   CHOL 171 06/11/2020   HDL 41.20 06/11/2020   LDLCALC 106 (H) 04/28/2019   LDLDIRECT 91.0 06/11/2020   TRIG 214.0 (H) 06/11/2020   CHOLHDL 4 06/11/2020   Lab Results  Component Value Date   HGBA1C 6.8 (H) 09/27/2020   Lab Results  Component Value Date   VITAMINB12 490 06/10/2018   Lab Results  Component Value Date   TSH 1.57 06/11/2020       ASSESSMENT AND PLAN  Chronic migraine w/o aura, not intractable, w/o stat migr  OSA -- dx ~ 2012, cpap intolerant  Obstructive hydrocephalus (HCC)  REM behavioral disorder  1.  Botox 180 units as follows: Frontalis muscle (5 units x 4), corrugators and procerus (5 units x 3), temporalis (5 units x 8), occipitalis (5 units x 4), splenius capitis (12.5 units x 2), splenius  cervicis (12.5 units x 2), C6-C7 paraspinal (5 units x 2), trapezius (12.5 units x 2)  2.   We discussed the Inspire device for OSA (could not tolerate CPAP) 3.   Clonazepam 0.5 mg for RBD and insomnia 4.   rtc 6 months, sooner if new or worsening issues.  Ilithyia Titzer A. Felecia Shelling, MD, [facility] 12/26/4763, 46:50 AM Certified in Neurology, Clinical Neurophysiology, Sleep Medicine, Pain Medicine and Neuroimaging  [facility] [address], [place], [place] [address] [phone]

## 2020-10-23 DIAGNOSIS — I10 Essential (primary) hypertension: Secondary | ICD-10-CM | POA: Insufficient documentation

## 2020-10-23 DIAGNOSIS — E1140 Type 2 diabetes mellitus with diabetic neuropathy, unspecified: Secondary | ICD-10-CM | POA: Insufficient documentation

## 2020-10-23 DIAGNOSIS — N200 Calculus of kidney: Secondary | ICD-10-CM | POA: Insufficient documentation

## 2020-10-24 ENCOUNTER — Other Ambulatory Visit: Payer: Self-pay

## 2020-10-24 ENCOUNTER — Other Ambulatory Visit (INDEPENDENT_AMBULATORY_CARE_PROVIDER_SITE_OTHER): Payer: 59

## 2020-10-24 DIAGNOSIS — Z794 Long term (current) use of insulin: Secondary | ICD-10-CM

## 2020-10-24 DIAGNOSIS — E1165 Type 2 diabetes mellitus with hyperglycemia: Secondary | ICD-10-CM

## 2020-10-24 DIAGNOSIS — H04123 Dry eye syndrome of bilateral lacrimal glands: Secondary | ICD-10-CM | POA: Diagnosis not present

## 2020-10-24 DIAGNOSIS — E119 Type 2 diabetes mellitus without complications: Secondary | ICD-10-CM | POA: Diagnosis not present

## 2020-10-24 LAB — BASIC METABOLIC PANEL
BUN: 23 mg/dL (ref 6–23)
CO2: 25 mEq/L (ref 19–32)
Calcium: 9.9 mg/dL (ref 8.4–10.5)
Chloride: 102 mEq/L (ref 96–112)
Creatinine, Ser: 1.55 mg/dL — ABNORMAL HIGH (ref 0.40–1.50)
GFR: 47.63 mL/min — ABNORMAL LOW (ref >60.00)
Glucose, Bld: 220 mg/dL — ABNORMAL HIGH (ref 70–99)
Potassium: 4.2 mEq/L (ref 3.5–5.1)
Sodium: 136 mEq/L (ref 135–145)

## 2020-10-24 LAB — HEMOGLOBIN A1C: Hgb A1c MFr Bld: 6.8 % — ABNORMAL HIGH (ref 4.6–6.5)

## 2020-10-24 LAB — HM DIABETES EYE EXAM

## 2020-10-28 ENCOUNTER — Ambulatory Visit: Payer: 59 | Admitting: Endocrinology

## 2020-10-29 ENCOUNTER — Ambulatory Visit: Payer: 59 | Admitting: Cardiology

## 2020-10-29 MED FILL — Clonazepam Tab 0.5 MG: ORAL | 30 days supply | Qty: 30 | Fill #0 | Status: AC

## 2020-10-30 ENCOUNTER — Ambulatory Visit (INDEPENDENT_AMBULATORY_CARE_PROVIDER_SITE_OTHER): Payer: 59 | Admitting: Cardiology

## 2020-10-30 ENCOUNTER — Other Ambulatory Visit: Payer: Self-pay

## 2020-10-30 ENCOUNTER — Encounter: Payer: Self-pay | Admitting: Cardiology

## 2020-10-30 ENCOUNTER — Other Ambulatory Visit (HOSPITAL_BASED_OUTPATIENT_CLINIC_OR_DEPARTMENT_OTHER): Payer: Self-pay

## 2020-10-30 VITALS — BP 110/80 | HR 78 | Ht 71.0 in | Wt 252.0 lb

## 2020-10-30 DIAGNOSIS — E785 Hyperlipidemia, unspecified: Secondary | ICD-10-CM | POA: Diagnosis not present

## 2020-10-30 DIAGNOSIS — I25118 Atherosclerotic heart disease of native coronary artery with other forms of angina pectoris: Secondary | ICD-10-CM

## 2020-10-30 DIAGNOSIS — I10 Essential (primary) hypertension: Secondary | ICD-10-CM

## 2020-10-30 NOTE — Patient Instructions (Signed)
Medication Instructions:  Your physician recommends that you continue on your current medications as directed. Please refer to the Current Medication list given to you today.  *If you need a refill on your cardiac medications before your next appointment, please call your pharmacy*   Lab Work: Your physician recommends that you return for lab work today: lipid  If you have labs (blood work) drawn today and your tests are completely normal, you will receive your results only by: Marland Kitchen MyChart Message (if you have MyChart) OR . A paper copy in the mail If you have any lab test that is abnormal or we need to change your treatment, we will call you to review the results.   Testing/Procedures: None   Follow-Up: At [facility], you and your health needs are our priority.  As part of our continuing mission to provide you with exceptional heart care, we have created designated Provider Care Teams.  These Care Teams include your primary Cardiologist (physician) and Advanced Practice Providers (APPs -  Physician Assistants and Nurse Practitioners) who all work together to provide you with the care you need, when you need it.  We recommend signing up for the patient portal called "MyChart".  Sign up information is provided on this After Visit Summary.  MyChart is used to connect with patients for Virtual Visits (Telemedicine).  Patients are able to view lab/test results, encounter notes, upcoming appointments, etc.  Non-urgent messages can be sent to your provider as well.   To learn more about what you can do with MyChart, go to [email or web address].    Your next appointment:   4 month(s)  The format for your next appointment:   In Person  Provider:   Jenne Campus, MD   Other Instructions

## 2020-10-30 NOTE — Progress Notes (Signed)
Cardiology Office Note:    Date:  10/30/2020   ID:  James [name], DOB May 05, 1956, MRN [id number]  PCP:  Colon Branch, MD  Cardiologist:  Jenne Campus, MD    Referring MD: Colon Branch, MD   Chief Complaint  Patient presents with  . Follow-up  Am doing better  History of Present Illness:    James [name] is a 65 y.o. male with past medical history significant for diabetes, essential hypertension, dyslipidemia, nonalcoholic liver cirrhosis.  He started having some atypical symptoms eventually coronary CT angio has been done which showed lesion in the mid left anterior descending artery that was hemodynamically significant.  He had of having cardiac catheterization just few weeks ago and a month ago he underwent staged procedure for LAD stenting.  Procedure was successful and he is doing quite well since that time.  He did not have typical chest pain tightness squeezing pressure burning chest, now he said he is doing better he got much more energy he can work longer he does not sleep as much.  Overall he considers this procedure successful.  Past Medical History:  Diagnosis Date  . Abnormal cardiac CT angiography   . Acid reflux   . Annual physical exam 04/08/2015  . Arthritis   . Atypical chest pain 06/13/2020  . Blood transfusion without reported diagnosis   . Body mass index (BMI) 35.0-35.9, adult 04/05/2019  . Chronic fatigue 01/28/2015  . Chronic headaches    on cymbalta  . Chronic migraine w/o aura, not intractable, w/o stat migr 10/24/2018  . Cirrhosis ([place])   . Colon polyps   . Complication of anesthesia    problems waking up from anesthesia  . Coronary artery disease 01/23/2019  . Depression    on cymbalta  . Diabetes mellitus with neuropathy ([place])   . Diabetes with neuropathy 04/25/2013  . Diverticulitis 03/2013  . Dyslipidemia 05/29/2019  . Eczema   . Elevated LFTs   . Epidural lipomatosis 10/05/2018  . Essential (primary) hypertension 04/05/2019  . Essential  hypertension 10/10/2019  . Fatty liver   . GERD (gastroesophageal reflux disease) 04/28/2011  . H/O craniotomy 05/07/2015  . Headache 04/28/2011  . Hepatitis 10/2017   NASH cirrhosis  . History of kidney stones   . Hyperlipidemia   . Hypersomnia with sleep apnea 01/28/2015  . Hypertension   . IDA (iron deficiency anemia) 01/24/2019  . Idiopathic intracranial hypertension 01/14/2017  . Insomnia 04/26/2013  . Kidney stone   . Liver cirrhosis secondary to NASH (nonalcoholic steatohepatitis) ([place]) 01/02/2016  . Low back pain 04/05/2019  . Lower back injury 08/14/2019  . Morbid obesity ([place])   . Neuromuscular disorder (HCC)    neuropathy  . Neuropathy   . Nonalcoholic steatohepatitis 09/01/1441  . Obstructive hydrocephalus ([place]) 01/28/2015  . OSA -- dx ~ 2012, cpap intolerant 09/04/2014    dx ~ 2012, cpap intolerant   . PCP NOTES >>> 04/08/2015  . Post-op pain 03/19/2019  . Post-traumatic hydrocephalus (HCC)    s/p shunts x 2 (first got infected )  . Presence of cerebrospinal fluid drainage device 07/28/2011  . Psoriasis    sees Dr [name]  . Psoriatic arthritis ([place])   . REM behavioral disorder 01/14/2017  . 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 Severe obesity (BMI >= 40) ([place]) 01/28/2015  . SI (sacroiliac) joint dysfunction 08/14/2019  . Sigmoid diverticulitis 04/25/2013  . Sleep apnea    no CPAP     .  Spondylolisthesis, lumbar region 03/16/2019  . Stomach ulcer   . Testosterone deficiency 04/28/2011  . VP (ventriculoperitoneal) shunt status 07/31/2020    Past Surgical History:  Procedure Laterality Date  . BACK SURGERY  1980  . BRAIN SURGERY     VP shunts placed in 2007  . CHOLECYSTECTOMY N/A 08/25/2017   Procedure: LAPAROSCOPIC CHOLECYSTECTOMY WITH INTRAOPERATIVE CHOLANGIOGRAM;  Surgeon: [name], MD;  Location: [facility];  Service: General;  Laterality: N/A;  . COLONOSCOPY    . CORONARY STENT INTERVENTION N/A 09/27/2020   Procedure: CORONARY STENT  INTERVENTION;  Surgeon: Jettie Booze, MD;  Location: [facility];  Service: Cardiovascular;  Laterality: N/A;  . INTRAVASCULAR ULTRASOUND/IVUS N/A 09/27/2020   Procedure: Intravascular Ultrasound/IVUS;  Surgeon: Jettie Booze, MD;  Location: [facility];  Service: Cardiovascular;  Laterality: N/A;  . JOINT REPLACEMENT     total hip  . LEFT HEART CATH N/A 09/27/2020   Procedure: Left Heart Cath;  Surgeon: Jettie Booze, MD;  Location: [facility];  Service: Cardiovascular;  Laterality: N/A;  . LEFT HEART CATH AND CORONARY ANGIOGRAPHY N/A 09/24/2020   Procedure: LEFT HEART CATH AND CORONARY ANGIOGRAPHY;  Surgeon: Jettie Booze, MD;  Location: [facility];  Service: Cardiovascular;  Laterality: N/A;  . LUMBAR FUSION  03/16/2019  . SHOULDER SURGERY Left 2010  . TOE SURGERY Left 2018  . TONSILLECTOMY     as a child  . TOTAL HIP ARTHROPLASTY Left 2011  . UPPER GASTROINTESTINAL ENDOSCOPY  01/04/2020  . VENTRICULOPERITONEAL SHUNT  2007   x2    Current Medications: Current Meds  Medication Sig  . Adalimumab 40 MG/0.8ML PNKT INJECT 40 MG (0.8 ML) UNDER THE SKIN EVERY OTHER WEEK. (Patient taking differently: Inject 40 mg into the skin as directed. Every other week)  . amLODipine (NORVASC) 5 MG tablet Take 1 tablet (5 mg total) by mouth daily.  Marland Kitchen aspirin EC 81 MG EC tablet Take 1 tablet (81 mg total) by mouth daily. Swallow whole.  Marland Kitchen atorvastatin (LIPITOR) 80 MG tablet Take 1 tablet (80 mg total) by mouth at bedtime.  . carvedilol (COREG) 12.5 MG tablet TAKE 1 TABLET (12.5 MG TOTAL) BY MOUTH 2 (TWO) TIMES DAILY WITH A MEAL. (Patient taking differently: Take 12.5 mg by mouth 2 (two) times daily with a meal.)  . clonazePAM (KLONOPIN) 0.5 MG tablet TAKE 1 TABLET BY MOUTH EVERY NIGHT AT BEDTIME (Patient taking differently: Take 0.5 mg by mouth at bedtime.)  . clopidogrel (PLAVIX) 75 MG tablet Take 1 tablet (75 mg total) by mouth daily.  . DULoxetine  (CYMBALTA) 60 MG capsule TAKE 2 CAPSULES (120 MG TOTAL) BY MOUTH DAILY. (Patient taking differently: Take 60 mg by mouth 2 (two) times daily.)  . EPINEPHrine (EPIPEN 2-PAK) 0.3 mg/0.3 mL IJ SOAJ injection Inject 0.3 mLs (0.3 mg total) into the muscle once as needed for up to 1 dose for anaphylaxis.  . fenofibrate micronized (LOFIBRA) 134 MG capsule TAKE 1 CAPSULE BY MOUTH THREE TIMES WEEKLY (Patient taking differently: Take 134 mg by mouth 3 (three) times a week.)  . gabapentin (NEURONTIN) 600 MG tablet TAKE 1 TABLET (600 MG TOTAL) BY MOUTH 2 (TWO) TIMES DAILY. (Patient taking differently: Take 600 mg by mouth 2 (two) times daily.)  . glimepiride (AMARYL) 4 MG tablet TAKE 1 TABLET BY MOUTH ONCE DAILY BEFORE BREAKFAST (Patient taking differently: Take 4 mg by mouth daily with breakfast.)  . Insulin Disposable Pump (OMNIPOD DASH 5  PACK PODS) MISC CHANGE EVERY 72 HOURS AS DIRECTED (Patient taking differently: 1 application by Other route daily. Unknown strength)  . insulin lispro (HUMALOG) 100 UNIT/ML injection USE PUMP TO INJECT 50 UNITS DAILY (Patient taking differently: Inject 50 Units into the skin See admin instructions. 50 units daily in pump)  . isosorbide mononitrate (IMDUR) 30 MG 24 hr tablet TAKE 1 TABLET BY MOUTH ONCE DAILY (Patient taking differently: Take 30 mg by mouth daily.)  . nitroGLYCERIN (NITROSTAT) 0.4 MG SL tablet PLACE 1 TABLETS UNDER THE TONGUE EVERY 5 MINS AS NEEDED FOR CHEST PAIN (Patient taking differently: Place 0.4 mg under the tongue every 5 (five) minutes as needed for chest pain.)  . pantoprazole (PROTONIX) 40 MG tablet Take 1 tablet (40 mg total) by mouth daily.  . Semaglutide,0.25 or 0.5MG/DOS, 2 MG/1.5ML SOPN INJECT 0.5MG INTO THE SKIN ONCE A WEEK (Patient taking differently: Inject 0.5 mg into the skin once a week.)     Allergies:   Bee venom, Hydrocodone bit-homatrop mbr, Toradol [ketorolac tromethamine], Morphine and related, and Sulfa drugs cross reactors   Social  History   Socioeconomic History  . Marital status: Married    Spouse name: [name]  . Number of children: 2  . Years of education: Not on file  . Highest education level: Not on file  Occupational History  . Occupation: disabled   Tobacco Use  . Smoking status: Never Smoker  . Smokeless tobacco: Never Used  Vaping Use  . Vaping Use: Never used  Substance and Sexual Activity  . Alcohol use: No  . Drug use: No  . Sexual activity: Yes    Partners: Female  Other Topics Concern  . 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   Social Determinants of Health   Financial Resource Strain: Not on file  Food Insecurity: Not on file  Transportation Needs: Not on file  Physical Activity: Not on file  Stress: Not on file  Social Connections: Not on file     Family History: The patient's family history includes Down syndrome in his son; Heart disease in his brother; Lung cancer in his father; Other in his brother and mother. There is no history of Diabetes, Prostate cancer, Colon cancer, Stomach cancer, Pancreatic cancer, or Liver disease. ROS:   Please see the history of present illness.    All 14 point review of systems negative except as described per history of present illness  EKGs/Labs/Other Studies Reviewed:    Cardiac catheterization from March of this year showed:  Ost LAD to Prox LAD lesion is 50% stenosed.  1st Diag lesion is 75% stenosed.  2nd Diag lesion is 75% stenosed.  Mid LAD lesion is 99% stenosed.  Mid Cx lesion is 75% stenosed.  RPAV lesion is 50% stenosed.  RPDA lesion is 25% stenosed.  The left ventricular systolic function is normal.  LV end diastolic pressure is normal.  The left ventricular ejection fraction is 55-65% by visual estimate.  There is no aortic valve  stenosis.    Recent Labs: 06/11/2020: ALT 32; TSH 1.57 09/28/2020: Hemoglobin 11.4; Platelets 114 10/24/2020: BUN 23; Creatinine, Ser 1.55; Potassium 4.2; Sodium 136  Recent Lipid Panel    Component Value Date/Time   CHOL 171 06/11/2020 1030   TRIG 214.0 (H) 06/11/2020 1030   HDL  41.20 06/11/2020 1030   CHOLHDL 4 06/11/2020 1030   VLDL 42.8 (H) 06/11/2020 1030   LDLCALC 106 (H) 04/28/2019 1000   LDLDIRECT 91.0 06/11/2020 1030    Physical Exam:    VS:  BP 110/80 (BP Location: Right Arm, Patient Position: Sitting)   Pulse 78   Ht 5' 11"  (1.803 m)   Wt 252 lb (114.3 kg)   SpO2 95%   BMI 35.15 kg/m     Wt Readings from Last 3 Encounters:  10/30/20 252 lb (114.3 kg)  10/22/20 251 lb (113.9 kg)  09/27/20 250 lb (113.4 kg)     GEN:  Well nourished, well developed in no acute distress HEENT: Normal NECK: No JVD; No carotid bruits LYMPHATICS: No lymphadenopathy CARDIAC: RRR, no murmurs, no rubs, no gallops RESPIRATORY:  Clear to auscultation without rales, wheezing or rhonchi  ABDOMEN: Soft, non-tender, non-distended MUSCULOSKELETAL:  No edema; No deformity  SKIN: Warm and dry LOWER EXTREMITIES: no swelling NEUROLOGIC:  Alert and oriented x 3 PSYCHIATRIC:  Normal affect   ASSESSMENT:    1. Coronary artery disease involving native coronary artery of native heart with other form of angina pectoris ([place])   2. Essential hypertension   3. Dyslipidemia    PLAN:    In order of problems listed above:  12. Coronary disease status post PTCA and stenting of the mid LAD.  Doing well from that point review on dual antiplatelet therapy which we will continue for at least a year.  Preferably longer. 13. Essential hypertension blood pressure well controlled continue present management. 14. Dyslipidemia I will check his fasting lipid profile, he is taking already high intensity statin which I will continue. 15. Diabetes mellitus I did review his K PN I have his hemoglobin A1c from  10/24/2020 showing 6.8 which is a good number. 16. Exercise from cardiac catheterization in the right groin and right arm inspected well healed doing fine from that point review. 17. I did review record from the hospital for that visit   Medication Adjustments/Labs and Tests Ordered: Current medicines are reviewed at length with the patient today.  Concerns regarding medicines are outlined above.  No orders of the defined types were placed in this encounter.  Medication changes: No orders of the defined types were placed in this encounter.   Signed, Park Liter, MD, FACC 10/30/2020 9:12 AM    [facility]

## 2020-10-31 ENCOUNTER — Ambulatory Visit (INDEPENDENT_AMBULATORY_CARE_PROVIDER_SITE_OTHER): Payer: 59 | Admitting: Endocrinology

## 2020-10-31 ENCOUNTER — Encounter: Payer: Self-pay | Admitting: Endocrinology

## 2020-10-31 ENCOUNTER — Other Ambulatory Visit (HOSPITAL_BASED_OUTPATIENT_CLINIC_OR_DEPARTMENT_OTHER): Payer: Self-pay

## 2020-10-31 VITALS — BP 120/80 | HR 76 | Ht 71.0 in | Wt 252.0 lb

## 2020-10-31 DIAGNOSIS — I2510 Atherosclerotic heart disease of native coronary artery without angina pectoris: Secondary | ICD-10-CM

## 2020-10-31 DIAGNOSIS — E1165 Type 2 diabetes mellitus with hyperglycemia: Secondary | ICD-10-CM

## 2020-10-31 DIAGNOSIS — E782 Mixed hyperlipidemia: Secondary | ICD-10-CM

## 2020-10-31 DIAGNOSIS — Z794 Long term (current) use of insulin: Secondary | ICD-10-CM | POA: Diagnosis not present

## 2020-10-31 LAB — LIPID PANEL
Chol/HDL Ratio: 3.9 ratio (ref 0.0–5.0)
Cholesterol, Total: 152 mg/dL (ref 100–199)
HDL: 39 mg/dL — ABNORMAL LOW (ref >39)
LDL Chol Calc (NIH): 68 mg/dL (ref 0–99)
Triglycerides: 278 mg/dL — ABNORMAL HIGH (ref 0–149)
VLDL Cholesterol Cal: 45 mg/dL — ABNORMAL HIGH (ref 5–40)

## 2020-10-31 MED ORDER — SEMAGLUTIDE (1 MG/DOSE) 4 MG/3ML ~~LOC~~ SOPN
1.0000 mg | PEN_INJECTOR | SUBCUTANEOUS | 2 refills | Status: DC
  Filled 2020-10-31 – 2020-11-07 (×2): qty 3, 28d supply, fill #0
  Filled 2020-12-24: qty 3, 28d supply, fill #1
  Filled 2021-01-30: qty 3, 28d supply, fill #2

## 2020-10-31 MED ORDER — ICOSAPENT ETHYL 1 G PO CAPS
2.0000 g | ORAL_CAPSULE | Freq: Two times a day (BID) | ORAL | 1 refills | Status: DC
  Filled 2020-10-31: qty 120, 30d supply, fill #0

## 2020-10-31 NOTE — Progress Notes (Signed)
Patient ID: James [name], male   DOB: 1955-07-14, 65 y.o.   MRN: [id number]           Reason for Appointment: Follow-up for Type 2 Diabetes   History of Present Illness:          Date of diagnosis of type 2 diabetes mellitus:  ?  2014      Background history:  He is not clear how his diabetes was diagnosed, likely on routine testing. Initially had been treated with metformin and also tried on Amaryl Patient had progressive increase in his blood sugars since 1/17 with stopping metformin and being on the regimen of Amaryl and Januvia, he thinks his blood sugars went up to 601.  He was then started on basal bolus insulin   Lowest A1c was 6.8 previously  Recent history:    Non-insulin hypoglycemic drugs the patient is taking are:  Amaryl 19m?, metformin 1000 mg twice daily, Ozempic 0.5 mg weekly  OMNIPOD insulin pump settings as follows Basal rates: 12 AM-6 AM = 0.1, 6 AM-11 AM = 0.5, 11 AM- 9:30 PM = 1.0 and 9:30 PM-12 AM = 1.2 Correction 1: 50 carbohydrate coverage 1: 1  His A1c is 6.8  Current management, blood sugar patterns and problems identified:   He was not taking metformin for 2 to 3 weeks, he says he was told to stop this because of having his cardiac stent  Also between 4/25 and today he says he has not used his insulin pump or any insulin since he was on vacation and also not getting his supplies back  Even while using the pump appears to have not done boluses consistently  He appears to be taking some boluses randomly but mostly when the blood sugars are going up especially after dinner, rarely taking 3 times a day  He says when he is going out he does not want to take his PDM with him since he cannot leave it in the car  Also he has not monitored his blood sugars for at least a week until recently  Previously was showing patterns of periodic POSTPRANDIAL hyperglycemia at various times especially in the late afternoons  Data was also incomplete on his  freestyle lRy[employer] Incavailable only 31% of the time  He says he forgets to take his ODaytonevery week  Not able to do much exercise especially with recent cardiac issues  Weight is about the same  Also no hypoglycemia   Side  effects from medications have been: None  Compliance with the medical regimen: Fair  Glucose monitoring:   Glucometer:  Freestyle libre  Recent CGM data:   Average blood sugar 146 with 31 % data availability  21% readings above 180  Previous data:  CGM use % of time  87  2-week average/GV  137/34, was 185  Time in range    80    %  % Time Above 180  16  % Time above 250   % Time Below 70 4     PRE-MEAL Fasting Lunch Dinner Bedtime Overall  Glucose range:       Averages:  105  122  134     POST-MEAL PC Breakfast PC Lunch PC Dinner  Glucose range:     Averages:  111  170  145     Self-care:   Meal times are:  Breakfast variable, lunch: 12-3 PM Dinner: 6 PM    Dietician visit, most recent:09/2015  CDE consultation: 12/2018  Weight history:  Wt Readings from Last 3 Encounters:  10/31/20 252 lb (114.3 kg)  10/30/20 252 lb (114.3 kg)  10/22/20 251 lb (113.9 kg)    Glycemic control:   Lab Results  Component Value Date   HGBA1C 6.8 (H) 10/24/2020   HGBA1C 6.8 (H) 09/27/2020   HGBA1C 7.3 (H) 06/18/2020   Lab Results  Component Value Date   MICROALBUR 0.9 03/19/2020   LDLCALC 68 10/30/2020   CREATININE 1.55 (H) 10/24/2020    Office Visit on 10/30/2020  Component Date Value Ref Range Status  . Cholesterol, Total 10/30/2020 152  100 - 199 mg/dL Final  . Triglycerides 10/30/2020 278* 0 - 149 mg/dL Final  . HDL 10/30/2020 39* >39 mg/dL Final  . VLDL Cholesterol Cal 10/30/2020 45* 5 - 40 mg/dL Final  . LDL Chol Calc (NIH) 10/30/2020 68  0 - 99 mg/dL Final  . Chol/HDL Ratio 10/30/2020 3.9  0.0 - 5.0 ratio Final   Comment:                                   T. Chol/HDL Ratio                                              Men  Women                               1/2 Avg.Risk  3.4    3.3                                   Avg.Risk  5.0    4.4                                2X Avg.Risk  9.6    7.1                                3X Avg.Risk 23.4   11.0        Allergies as of 10/31/2020      Reactions   Bee Venom Anaphylaxis   Hydrocodone Bit-homatrop Mbr Other (See Comments)   Hallucinations, confusion, delirium Depressed feeling   Toradol [ketorolac Tromethamine] Other (See Comments)   Hallucinations, confusion, delirium   Morphine And Related Other (See Comments)   Hallucinations, back in the 80s. States has taken vicodin before w/o problems    Sulfa Drugs Cross Reactors Rash      Medication List       Accurate as of Oct 31, 2020 11:49 AM. If you have any questions, ask your nurse or doctor.        STOP taking these medications   Ozempic (0.25 or 0.5 MG/DOSE) 2 MG/1.5ML Sopn Generic drug: Semaglutide(0.25 or 0.5MG/DOS) Replaced by: Ozempic (1 MG/DOSE) 2 MG/1.5ML Sopn Stopped by: Elayne Snare, MD     TAKE these medications   amLODipine 5 MG tablet Commonly known as: NORVASC Take 1 tablet (5 mg total) by mouth daily.   aspirin 81 MG EC tablet Take 1 tablet (81 mg total)  by mouth daily. Swallow whole.   atorvastatin 80 MG tablet Commonly known as: LIPITOR Take 1 tablet (80 mg total) by mouth at bedtime.   carvedilol 12.5 MG tablet Commonly known as: COREG TAKE 1 TABLET (12.5 MG TOTAL) BY MOUTH 2 (TWO) TIMES DAILY WITH A MEAL. What changed: how much to take   clonazePAM 0.5 MG tablet Commonly known as: KLONOPIN TAKE 1 TABLET BY MOUTH EVERY NIGHT AT BEDTIME What changed: how much to take   clopidogrel 75 MG tablet Commonly known as: Plavix Take 1 tablet (75 mg total) by mouth daily.   DULoxetine 60 MG capsule Commonly known as: CYMBALTA TAKE 2 CAPSULES (120 MG TOTAL) BY MOUTH DAILY. What changed:   how much to take  how to take this  when to take this   EPINEPHrine 0.3  mg/0.3 mL Soaj injection Commonly known as: EpiPen 2-Pak Inject 0.3 mLs (0.3 mg total) into the muscle once as needed for up to 1 dose for anaphylaxis.   fenofibrate micronized 134 MG capsule Commonly known as: LOFIBRA TAKE 1 CAPSULE BY MOUTH THREE TIMES WEEKLY What changed:   how much to take  how to take this  when to take this   gabapentin 600 MG tablet Commonly known as: NEURONTIN TAKE 1 TABLET (600 MG TOTAL) BY MOUTH 2 (TWO) TIMES DAILY. What changed:   how much to take  how to take this  when to take this   glimepiride 4 MG tablet Commonly known as: AMARYL TAKE 1 TABLET BY MOUTH ONCE DAILY BEFORE BREAKFAST What changed:   how much to take  when to take this   HumaLOG 100 UNIT/ML injection Generic drug: insulin lispro USE PUMP TO INJECT 50 UNITS DAILY What changed:   how much to take  how to take this  when to take this  additional instructions   Humira Pen 40 MG/0.8ML Pnkt Generic drug: Adalimumab INJECT 40 MG (0.8 ML) UNDER THE SKIN EVERY OTHER WEEK. What changed:   how much to take  how to take this  when to take this  additional instructions   icosapent Ethyl 1 g capsule Commonly known as: VASCEPA Take 2 capsules (2 g total) by mouth 2 (two) times daily. Started by: Elayne Snare, MD   isosorbide mononitrate 30 MG 24 hr tablet Commonly known as: IMDUR TAKE 1 TABLET BY MOUTH ONCE DAILY What changed: how much to take   nitroGLYCERIN 0.4 MG SL tablet Commonly known as: NITROSTAT PLACE 1 TABLETS UNDER THE TONGUE EVERY 5 MINS AS NEEDED FOR CHEST PAIN What changed:   how much to take  how to take this  when to take this  reasons to take this   Omnipod DASH Pods (Gen 4) Misc CHANGE EVERY 72 HOURS AS DIRECTED What changed:   how much to take  how to take this  when to take this  additional instructions   Ozempic (1 MG/DOSE) 2 MG/1.5ML Sopn Generic drug: Semaglutide (1 MG/DOSE) Inject 1 mg into the skin once a  week. Replaces: Ozempic (0.25 or 0.5 MG/DOSE) 2 MG/1.5ML Sopn Started by: Elayne Snare, MD   pantoprazole 40 MG tablet Commonly known as: Protonix Take 1 tablet (40 mg total) by mouth daily.       Allergies:  Allergies  Allergen Reactions  . Bee Venom Anaphylaxis  . Hydrocodone Bit-Homatrop Mbr Other (See Comments)    Hallucinations, confusion, delirium Depressed feeling  . Toradol [Ketorolac Tromethamine] Other (See Comments)    Hallucinations, confusion, delirium  .  Morphine And Related Other (See Comments)    Hallucinations, back in the 80s. States has taken vicodin before w/o problems   . Sulfa Drugs Cross Reactors Rash    Past Medical History:  Diagnosis Date  . Abnormal cardiac CT angiography   . Acid reflux   . Annual physical exam 04/08/2015  . Arthritis   . Atypical chest pain 06/13/2020  . Blood transfusion without reported diagnosis   . Body mass index (BMI) 35.0-35.9, adult 04/05/2019  . Chronic fatigue 01/28/2015  . Chronic headaches    on cymbalta  . Chronic migraine w/o aura, not intractable, w/o stat migr 10/24/2018  . Cirrhosis ([place])   . Colon polyps   . Complication of anesthesia    problems waking up from anesthesia  . Coronary artery disease 01/23/2019  . Depression    on cymbalta  . Diabetes mellitus with neuropathy ([place])   . Diabetes with neuropathy 04/25/2013  . Diverticulitis 03/2013  . Dyslipidemia 05/29/2019  . Eczema   . Elevated LFTs   . Epidural lipomatosis 10/05/2018  . Essential (primary) hypertension 04/05/2019  . Essential hypertension 10/10/2019  . Fatty liver   . GERD (gastroesophageal reflux disease) 04/28/2011  . H/O craniotomy 05/07/2015  . Headache 04/28/2011  . Hepatitis 10/2017   NASH cirrhosis  . History of kidney stones   . Hyperlipidemia   . Hypersomnia with sleep apnea 01/28/2015  . Hypertension   . IDA (iron deficiency anemia) 01/24/2019  . Idiopathic intracranial hypertension 01/14/2017  . Insomnia 04/26/2013  . Kidney  stone   . Liver cirrhosis secondary to NASH (nonalcoholic steatohepatitis) ([place]) 01/02/2016  . Low back pain 04/05/2019  . Lower back injury 08/14/2019  . Morbid obesity ([place])   . Neuromuscular disorder (HCC)    neuropathy  . Neuropathy   . Nonalcoholic steatohepatitis 09/04/2503  . Obstructive hydrocephalus ([place]) 01/28/2015  . OSA -- dx ~ 2012, cpap intolerant 09/04/2014    dx ~ 2012, cpap intolerant   . PCP NOTES >>> 04/08/2015  . Post-op pain 03/19/2019  . Post-traumatic hydrocephalus (HCC)    s/p shunts x 2 (first got infected )  . Presence of cerebrospinal fluid drainage device 07/28/2011  . Psoriasis    sees Dr [name]  . Psoriatic arthritis ([place])   . REM behavioral disorder 01/14/2017  . 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 Severe obesity (BMI >= 40) ([place]) 01/28/2015  . SI (sacroiliac) joint dysfunction 08/14/2019  . Sigmoid diverticulitis 04/25/2013  . Sleep apnea    no CPAP     . Spondylolisthesis, lumbar region 03/16/2019  . Stomach ulcer   . Testosterone deficiency 04/28/2011  . VP (ventriculoperitoneal) shunt status 07/31/2020    Past Surgical History:  Procedure Laterality Date  . BACK SURGERY  1980  . BRAIN SURGERY     VP shunts placed in 2007  . CHOLECYSTECTOMY N/A 08/25/2017   Procedure: LAPAROSCOPIC CHOLECYSTECTOMY WITH INTRAOPERATIVE CHOLANGIOGRAM;  Surgeon: [name], MD;  Location: [facility];  Service: General;  Laterality: N/A;  . COLONOSCOPY    . CORONARY STENT INTERVENTION N/A 09/27/2020   Procedure: CORONARY STENT INTERVENTION;  Surgeon: Jettie Booze, MD;  Location: [facility];  Service: Cardiovascular;  Laterality: N/A;  . INTRAVASCULAR ULTRASOUND/IVUS N/A 09/27/2020   Procedure: Intravascular Ultrasound/IVUS;  Surgeon: Jettie Booze, MD;  Location: [facility];  Service: Cardiovascular;  Laterality: N/A;  . JOINT REPLACEMENT     total hip  .  LEFT HEART CATH N/A 09/27/2020   Procedure: Left Heart  Cath;  Surgeon: Jettie Booze, MD;  Location: [facility];  Service: Cardiovascular;  Laterality: N/A;  . LEFT HEART CATH AND CORONARY ANGIOGRAPHY N/A 09/24/2020   Procedure: LEFT HEART CATH AND CORONARY ANGIOGRAPHY;  Surgeon: Jettie Booze, MD;  Location: [facility];  Service: Cardiovascular;  Laterality: N/A;  . LUMBAR FUSION  03/16/2019  . SHOULDER SURGERY Left 2010  . TOE SURGERY Left 2018  . TONSILLECTOMY     as a child  . TOTAL HIP ARTHROPLASTY Left 2011  . UPPER GASTROINTESTINAL ENDOSCOPY  01/04/2020  . VENTRICULOPERITONEAL SHUNT  2007   x2    Family History  Problem Relation Age of Onset  . Other Mother   . Lung cancer Father        alive, former smoker   . Heart disease Brother        MI age 67  . Other Brother        Murdered  . Down syndrome Son   . Diabetes Neg Hx   . Prostate cancer Neg Hx   . Colon cancer Neg Hx   . Stomach cancer Neg Hx   . Pancreatic cancer Neg Hx   . Liver disease Neg Hx     Social History:  reports that he has never smoked. He has never used smokeless tobacco. He reports that he does not drink alcohol and does not use drugs.    Review of Systems      HYPERTENSION: On carvedilol for this Hypertension managed by his PCP   BP Readings from Last 3 Encounters:  10/31/20 120/80  10/30/20 110/80  10/22/20 128/76   RENAL dysfunction: Has had variable creatinine levels but persistently mildly abnormal  Lab Results  Component Value Date   CREATININE 1.55 (H) 10/24/2020   CREATININE 1.50 (H) 09/28/2020   CREATININE 1.48 (H) 09/27/2020    Lipid history: He is on Lipitor  20 mg  Is on fenofibrate 3 times a week for high triglycerides, not clear if he takes this regularly  LDL is below 70 now   Lab Results  Component Value Date   CHOL 152 10/30/2020   CHOL 171 06/11/2020   CHOL 172 10/09/2019   Lab Results  Component Value Date   HDL 39 (L) 10/30/2020   HDL 41.20 06/11/2020   HDL 30.90 (L)  10/09/2019   Lab Results  Component Value Date   LDLCALC 68 10/30/2020   LDLCALC 106 (H) 04/28/2019   LDLCALC 102 (H) 04/28/2019   Lab Results  Component Value Date   TRIG 278 (H) 10/30/2020   TRIG 214.0 (H) 06/11/2020   TRIG 283.0 (H) 10/09/2019   Lab Results  Component Value Date   CHOLHDL 3.9 10/30/2020   CHOLHDL 4 06/11/2020   CHOLHDL 6 10/09/2019   Lab Results  Component Value Date   LDLDIRECT 91.0 06/11/2020   LDLDIRECT 83.0 10/09/2019   LDLDIRECT 89.0 05/03/2018            He is being followed by gastroenterology for liver cirrhosis secondary to [facility]  Lab Results  Component Value Date   ALT 32 06/11/2020     He has symptomatic painful neuropathy and is on gabapentin 600 mg twice a day and  Cymbalta 60 mg Symptoms are relatively well controlled Also he has had difficulties with sciatica-like pains  Has been followed by neurologist    Review of Systems    Physical  Examination:  BP 120/80 (BP Location: Left Arm, Patient Position: Sitting, Cuff Size: Normal)   Pulse 76   Ht 5' 11"  (1.803 m)   Wt 252 lb (114.3 kg)   SpO2 99%   BMI 35.15 kg/m       ASSESSMENT:  Diabetes type 2 with obesity  See history of present illness for discussion of current diabetes management, blood sugar patterns and problems identified  Currently on a regimen of insulin with the OmniPod pump, Ozempic and Metformin 1 g twice daily  A1c 6.8  This is despite significant difficulty with his following his treatment plan with insulin, Ozempic, metformin as well as glucose monitoring and boluses He is likely doing little better with the diet especially with taking Ozempic although not losing weight However does periodically have high postprandial readings over 200 when he is eating higher carbohydrate meals are other high glycemic index food or drinks Still not exercising much  HYPERLIPIDEMIA: His triglycerides are nearly 300 fasting yesterday although LDL is  controlled  PLAN:   Since he is tolerating Ozempic fairly well we will try him on 1 mg weekly He says he can ask his wife to set up a reminder on his phone to take this consistently every week  Discussed importance of checking blood sugar several times a day He will try to take his insulin regularly with the pump and emphasized the need to bolus before starting to eat rather than waiting till the blood sugar goes up He will need to take his PDM with him when he is going out to eat at restaurants For renal dysfunction we will reduce his metformin to 1500 mg a day No need to take Amaryl Call if having low blood sugars  Avoid regular soft drinks  Start VASCEPA 2 g twice daily for high triglycerides and risk reduction, continue fenofibrate 3 days a week for now until renal function improved  Patient Instructions  Bolus when starting to eat all Carbs meals  Metformin 2 in am and 1 at dinner  Check sugar 4x daily  Take Ozempic weekly regularly, set up reminder on phone        Elayne Snare 10/31/2020, 11:49 AM

## 2020-10-31 NOTE — Patient Instructions (Addendum)
Bolus when starting to eat all Carbs meals  Metformin 2 in am and 1 at dinner  Check sugar 4x daily  Take Ozempic weekly regularly, set up reminder on phone

## 2020-11-07 ENCOUNTER — Other Ambulatory Visit (HOSPITAL_COMMUNITY): Payer: Self-pay

## 2020-11-07 ENCOUNTER — Other Ambulatory Visit (HOSPITAL_BASED_OUTPATIENT_CLINIC_OR_DEPARTMENT_OTHER): Payer: Self-pay

## 2020-11-07 DIAGNOSIS — H16143 Punctate keratitis, bilateral: Secondary | ICD-10-CM | POA: Diagnosis not present

## 2020-11-07 MED ORDER — TOBRAMYCIN-DEXAMETHASONE 0.3-0.1 % OP SUSP
OPHTHALMIC | 0 refills | Status: DC
  Filled 2020-11-07: qty 5, 25d supply, fill #0

## 2020-11-07 MED ORDER — TOBRAMYCIN-DEXAMETHASONE 0.3-0.1 % OP SUSP
OPHTHALMIC | 0 refills | Status: DC
  Filled 2020-11-07: qty 2.5, 5d supply, fill #0
  Filled 2020-11-07: qty 2.5, 7d supply, fill #0

## 2020-11-08 ENCOUNTER — Other Ambulatory Visit (HOSPITAL_BASED_OUTPATIENT_CLINIC_OR_DEPARTMENT_OTHER): Payer: Self-pay

## 2020-11-13 ENCOUNTER — Other Ambulatory Visit (HOSPITAL_COMMUNITY): Payer: Self-pay

## 2020-11-13 MED FILL — Adalimumab Auto-injector Kit 40 MG/0.8ML: SUBCUTANEOUS | 28 days supply | Qty: 2 | Fill #1 | Status: AC

## 2020-11-18 ENCOUNTER — Other Ambulatory Visit (HOSPITAL_BASED_OUTPATIENT_CLINIC_OR_DEPARTMENT_OTHER): Payer: Self-pay

## 2020-11-18 MED ORDER — METFORMIN HCL 500 MG PO TABS
ORAL_TABLET | ORAL | 3 refills | Status: DC
  Filled 2020-11-18: qty 120, 30d supply, fill #0
  Filled 2021-01-22: qty 120, 30d supply, fill #1

## 2020-11-18 MED FILL — Insulin Lispro Inj Soln 100 Unit/ML: INTRAMUSCULAR | 80 days supply | Qty: 40 | Fill #0 | Status: AC

## 2020-11-28 ENCOUNTER — Other Ambulatory Visit: Payer: Self-pay | Admitting: Neurology

## 2020-11-28 MED FILL — Fenofibrate Micronized Cap 134 MG: ORAL | 35 days supply | Qty: 15 | Fill #0 | Status: AC

## 2020-11-29 ENCOUNTER — Ambulatory Visit: Payer: 59 | Attending: Internal Medicine

## 2020-11-29 ENCOUNTER — Other Ambulatory Visit (HOSPITAL_BASED_OUTPATIENT_CLINIC_OR_DEPARTMENT_OTHER): Payer: Self-pay

## 2020-11-29 DIAGNOSIS — Z23 Encounter for immunization: Secondary | ICD-10-CM

## 2020-11-29 NOTE — Progress Notes (Signed)
   Covid-19 Vaccination Clinic  Name:  TREA LATNER    MRN: [id number] DOB: February 13, 1956  11/29/2020  Mr. Dull was observed post Covid-19 immunization for 15 minutes without incident. He was provided with Vaccine Information Sheet and instruction to access the V-Safe system.   Mr. Weiand was instructed to call 911 with any severe reactions post vaccine: Marland Kitchen Difficulty breathing  . Swelling of face and throat  . A fast heartbeat  . A bad rash all over body  . Dizziness and weakness   Immunizations Administered    Name Date Dose VIS Date Route   PFIZER Comrnaty([place] TOP) Covid-19 Vaccine 11/29/2020  3:42 PM 0.3 mL 06/06/2020 Intramuscular   Manufacturer: [employer] [employer]   Lot: [id number]   [place]: [phone]

## 2020-12-02 ENCOUNTER — Telehealth: Payer: Self-pay

## 2020-12-02 ENCOUNTER — Other Ambulatory Visit (HOSPITAL_BASED_OUTPATIENT_CLINIC_OR_DEPARTMENT_OTHER): Payer: Self-pay

## 2020-12-02 DIAGNOSIS — Z006 Encounter for examination for normal comparison and control in clinical research program: Secondary | ICD-10-CM

## 2020-12-02 MED ORDER — CLONAZEPAM 0.5 MG PO TABS
ORAL_TABLET | Freq: Every day | ORAL | 5 refills | Status: DC
  Filled 2020-12-02: qty 30, 30d supply, fill #0
  Filled 2021-01-16: qty 30, 30d supply, fill #1
  Filled 2021-02-14: qty 30, 30d supply, fill #2
  Filled 2021-03-21: qty 30, 30d supply, fill #3
  Filled 2021-04-16: qty 30, 30d supply, fill #4
  Filled 2021-05-18: qty 30, 30d supply, fill #5

## 2020-12-02 NOTE — Telephone Encounter (Signed)
Called patient for 90 day Identify phone call pt stated he is not having anymore cardiac symptoms but he did follow up with PCP. The pt also informed me that he was recommended to have a stent placed following his cardiac cath. I reminded the patient that we would be calling him back around March for a year follow up phone call.

## 2020-12-03 ENCOUNTER — Other Ambulatory Visit (HOSPITAL_BASED_OUTPATIENT_CLINIC_OR_DEPARTMENT_OTHER): Payer: Self-pay

## 2020-12-03 ENCOUNTER — Telehealth (HOSPITAL_COMMUNITY): Payer: Self-pay

## 2020-12-03 MED ORDER — PFIZER-BIONT COVID-19 VAC-TRIS 30 MCG/0.3ML IM SUSP
INTRAMUSCULAR | 0 refills | Status: DC
  Filled 2020-12-03: qty 0.3, 1d supply, fill #0

## 2020-12-03 NOTE — Telephone Encounter (Signed)
Pt is unable to do cardiac rehab at this time. Closed referral

## 2020-12-04 ENCOUNTER — Other Ambulatory Visit: Payer: Self-pay | Admitting: Endocrinology

## 2020-12-04 ENCOUNTER — Other Ambulatory Visit (HOSPITAL_BASED_OUTPATIENT_CLINIC_OR_DEPARTMENT_OTHER): Payer: Self-pay

## 2020-12-04 MED ORDER — FREESTYLE LIBRE 14 DAY SENSOR MISC
3 refills | Status: DC
  Filled 2020-12-04: qty 2, 28d supply, fill #0
  Filled 2020-12-24 – 2020-12-27 (×2): qty 2, 28d supply, fill #1

## 2020-12-05 ENCOUNTER — Other Ambulatory Visit (HOSPITAL_BASED_OUTPATIENT_CLINIC_OR_DEPARTMENT_OTHER): Payer: Self-pay

## 2020-12-09 ENCOUNTER — Other Ambulatory Visit (HOSPITAL_COMMUNITY): Payer: Self-pay

## 2020-12-09 MED FILL — Adalimumab Auto-injector Kit 40 MG/0.8ML: SUBCUTANEOUS | 28 days supply | Qty: 2 | Fill #2 | Status: AC

## 2020-12-10 ENCOUNTER — Ambulatory Visit: Payer: 59 | Admitting: Internal Medicine

## 2020-12-16 ENCOUNTER — Other Ambulatory Visit (HOSPITAL_COMMUNITY): Payer: Self-pay

## 2020-12-18 ENCOUNTER — Other Ambulatory Visit: Payer: Self-pay

## 2020-12-18 DIAGNOSIS — K7460 Unspecified cirrhosis of liver: Secondary | ICD-10-CM

## 2020-12-24 ENCOUNTER — Ambulatory Visit (HOSPITAL_COMMUNITY)
Admission: RE | Admit: 2020-12-24 | Discharge: 2020-12-24 | Disposition: A | Discharge status: Home / Self Care | Payer: 59 | Source: Ambulatory Visit | Attending: Internal Medicine | Admitting: Internal Medicine

## 2020-12-24 ENCOUNTER — Other Ambulatory Visit: Payer: Self-pay

## 2020-12-24 DIAGNOSIS — K7460 Unspecified cirrhosis of liver: Secondary | ICD-10-CM | POA: Insufficient documentation

## 2020-12-24 DIAGNOSIS — K7581 Nonalcoholic steatohepatitis (NASH): Secondary | ICD-10-CM | POA: Insufficient documentation

## 2020-12-24 MED FILL — Duloxetine HCl Enteric Coated Pellets Cap 60 MG (Base Eq): ORAL | 90 days supply | Qty: 180 | Fill #0 | Status: AC

## 2020-12-24 MED FILL — Isosorbide Mononitrate Tab ER 24HR 30 MG: ORAL | 90 days supply | Qty: 90 | Fill #0 | Status: AC

## 2020-12-25 ENCOUNTER — Encounter: Payer: Self-pay | Admitting: Internal Medicine

## 2020-12-25 ENCOUNTER — Other Ambulatory Visit: Payer: Self-pay | Admitting: Cardiology

## 2020-12-25 ENCOUNTER — Other Ambulatory Visit: Payer: Self-pay | Admitting: Internal Medicine

## 2020-12-25 ENCOUNTER — Other Ambulatory Visit (HOSPITAL_BASED_OUTPATIENT_CLINIC_OR_DEPARTMENT_OTHER): Payer: Self-pay

## 2020-12-25 ENCOUNTER — Ambulatory Visit (INDEPENDENT_AMBULATORY_CARE_PROVIDER_SITE_OTHER): Payer: 59 | Admitting: Internal Medicine

## 2020-12-25 VITALS — BP 128/84 | HR 80 | Temp 98.4°F | Resp 18 | Ht 71.0 in | Wt 245.4 lb

## 2020-12-25 DIAGNOSIS — D500 Iron deficiency anemia secondary to blood loss (chronic): Secondary | ICD-10-CM

## 2020-12-25 DIAGNOSIS — N189 Chronic kidney disease, unspecified: Secondary | ICD-10-CM | POA: Diagnosis not present

## 2020-12-25 DIAGNOSIS — Z7185 Encounter for immunization safety counseling: Secondary | ICD-10-CM

## 2020-12-25 DIAGNOSIS — Z0100 Encounter for examination of eyes and vision without abnormal findings: Secondary | ICD-10-CM | POA: Diagnosis not present

## 2020-12-25 DIAGNOSIS — I2510 Atherosclerotic heart disease of native coronary artery without angina pectoris: Secondary | ICD-10-CM

## 2020-12-25 DIAGNOSIS — Z23 Encounter for immunization: Secondary | ICD-10-CM

## 2020-12-25 DIAGNOSIS — E119 Type 2 diabetes mellitus without complications: Secondary | ICD-10-CM

## 2020-12-25 LAB — IRON: Iron: 78 ug/dL (ref 42–165)

## 2020-12-25 LAB — CBC WITH DIFFERENTIAL/PLATELET
Basophils Absolute: 0 10*3/uL (ref 0.0–0.1)
Basophils Relative: 0.4 % (ref 0.0–3.0)
Eosinophils Absolute: 0.2 10*3/uL (ref 0.0–0.7)
Eosinophils Relative: 3.2 % (ref 0.0–5.0)
HCT: 40.5 % (ref 39.0–52.0)
Hemoglobin: 13.6 g/dL (ref 13.0–17.0)
Lymphocytes Relative: 29.8 % (ref 12.0–46.0)
Lymphs Abs: 1.6 10*3/uL (ref 0.7–4.0)
MCHC: 33.6 g/dL (ref 30.0–36.0)
MCV: 91.4 fl (ref 78.0–100.0)
Monocytes Absolute: 0.4 10*3/uL (ref 0.1–1.0)
Monocytes Relative: 6.9 % (ref 3.0–12.0)
Neutro Abs: 3.3 10*3/uL (ref 1.4–7.7)
Neutrophils Relative %: 59.7 % (ref 43.0–77.0)
Platelets: 137 10*3/uL — ABNORMAL LOW (ref 150.0–400.0)
RBC: 4.43 Mil/uL (ref 4.22–5.81)
RDW: 15.3 % (ref 11.5–15.5)
WBC: 5.5 10*3/uL (ref 4.0–10.5)

## 2020-12-25 LAB — BASIC METABOLIC PANEL
BUN: 24 mg/dL — ABNORMAL HIGH (ref 6–23)
CO2: 25 mEq/L (ref 19–32)
Calcium: 9.8 mg/dL (ref 8.4–10.5)
Chloride: 105 mEq/L (ref 96–112)
Creatinine, Ser: 1.54 mg/dL — ABNORMAL HIGH (ref 0.40–1.50)
GFR: 47.94 mL/min — ABNORMAL LOW (ref >60.00)
Glucose, Bld: 145 mg/dL — ABNORMAL HIGH (ref 70–99)
Potassium: 4.5 mEq/L (ref 3.5–5.1)
Sodium: 139 mEq/L (ref 135–145)

## 2020-12-25 LAB — FERRITIN: Ferritin: 119 ng/mL (ref 22.0–322.0)

## 2020-12-25 MED ORDER — CLOPIDOGREL BISULFATE 75 MG PO TABS
75.0000 mg | ORAL_TABLET | Freq: Every day | ORAL | 3 refills | Status: DC
  Filled 2020-12-25: qty 90, 90d supply, fill #0
  Filled 2021-03-21: qty 90, 90d supply, fill #1
  Filled 2021-06-16: qty 90, 90d supply, fill #2
  Filled 2021-09-18: qty 90, 90d supply, fill #3

## 2020-12-25 MED ORDER — ATORVASTATIN CALCIUM 80 MG PO TABS
80.0000 mg | ORAL_TABLET | Freq: Every day | ORAL | 1 refills | Status: DC
  Filled 2020-12-25 – 2021-03-21 (×2): qty 90, 90d supply, fill #0
  Filled 2021-06-16: qty 90, 90d supply, fill #1

## 2020-12-25 MED ORDER — PANTOPRAZOLE SODIUM 40 MG PO TBEC
40.0000 mg | DELAYED_RELEASE_TABLET | Freq: Every day | ORAL | 1 refills | Status: DC
  Filled 2020-12-25: qty 90, 90d supply, fill #0
  Filled 2021-03-21: qty 90, 90d supply, fill #1

## 2020-12-25 NOTE — Progress Notes (Signed)
Subjective:    Patient ID: James [name], male    DOB: 1955/08/05, 65 y.o.   MRN: [id number]  DOS:  12/25/2020 Type of visit - description: ROV Currently feeling well.  Since the last visit  he had a stent, chart reviewed.   Review of Systems Denies chest pain no difficulty breathing No nausea, vomiting, diarrhea.  No blood in the stools  Past Medical History:  Diagnosis Date   Abnormal cardiac CT angiography    Acid reflux    Annual physical exam 04/08/2015   Arthritis    Atypical chest pain 06/13/2020   Blood transfusion without reported diagnosis    Body mass index (BMI) 35.0-35.9, adult 04/05/2019   Chronic fatigue 01/28/2015   Chronic headaches    on cymbalta   Chronic migraine w/o aura, not intractable, w/o stat migr 10/24/2018   Cirrhosis ([place])    Colon polyps    Complication of anesthesia    problems waking up from anesthesia   Coronary artery disease 01/23/2019   Depression    on cymbalta   Diabetes mellitus with neuropathy ([place])    Diabetes with neuropathy 04/25/2013   Diverticulitis 03/2013   Dyslipidemia 05/29/2019   Eczema    Elevated LFTs    Epidural lipomatosis 10/05/2018   Essential (primary) hypertension 04/05/2019   Essential hypertension 10/10/2019   Fatty liver    GERD (gastroesophageal reflux disease) 04/28/2011   H/O craniotomy 05/07/2015   Headache 04/28/2011   Hepatitis 10/2017   NASH cirrhosis   History of kidney stones    Hyperlipidemia    Hypersomnia with sleep apnea 01/28/2015   Hypertension    IDA (iron deficiency anemia) 01/24/2019   Idiopathic intracranial hypertension 01/14/2017   Insomnia 04/26/2013   Kidney stone    Liver cirrhosis secondary to NASH (nonalcoholic steatohepatitis) ([place]) 01/02/2016   Low back pain 04/05/2019   Lower back injury 08/14/2019   Morbid obesity ([place])    Neuromuscular disorder ([place])    neuropathy   Neuropathy    Nonalcoholic steatohepatitis 10/04/5460   Obstructive hydrocephalus ([place]) 01/28/2015   OSA -- dx ~  2012, cpap intolerant 09/04/2014    dx ~ 2012, cpap intolerant    PCP NOTES >>> 04/08/2015   Post-op pain 03/19/2019   Post-traumatic hydrocephalus ([place])    s/p shunts x 2 (first got infected )   Presence of cerebrospinal fluid drainage device 07/28/2011   Psoriasis    sees Dr [name]   Psoriatic arthritis ([place])    REM behavioral disorder 01/14/2017   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   see's Dr.Ortmann   Severe obesity (BMI >= 40) ([place]) 01/28/2015   SI (sacroiliac) joint dysfunction 08/14/2019   Sigmoid diverticulitis 04/25/2013   Sleep apnea    no CPAP      Spondylolisthesis, lumbar region 03/16/2019   Stomach ulcer    Testosterone deficiency 04/28/2011   VP (ventriculoperitoneal) shunt status 07/31/2020    Past Surgical History:  Procedure Laterality Date   [place]     VP shunts placed in 2007   CHOLECYSTECTOMY N/A 08/25/2017   Procedure: LAPAROSCOPIC CHOLECYSTECTOMY WITH INTRAOPERATIVE CHOLANGIOGRAM;  Surgeon: [name], MD;  Location: [facility];  Service: General;  Laterality: N/A;   COLONOSCOPY     CORONARY STENT INTERVENTION N/A 09/27/2020   Procedure: CORONARY STENT INTERVENTION;  Surgeon: Jettie Booze, MD;  Location: [facility];  Service: Cardiovascular;  Laterality: N/A;   INTRAVASCULAR ULTRASOUND/IVUS  N/A 09/27/2020   Procedure: Intravascular Ultrasound/IVUS;  Surgeon: Jettie Booze, MD;  Location: [facility];  Service: Cardiovascular;  Laterality: N/A;   JOINT REPLACEMENT     total hip   LEFT HEART CATH N/A 09/27/2020   Procedure: Left Heart Cath;  Surgeon: Jettie Booze, MD;  Location: [facility];  Service: Cardiovascular;  Laterality: N/A;   LEFT HEART CATH AND CORONARY ANGIOGRAPHY N/A 09/24/2020   Procedure: LEFT HEART CATH AND CORONARY ANGIOGRAPHY;  Surgeon: Jettie Booze, MD;  Location: [facility];  Service: Cardiovascular;  Laterality: N/A;   LUMBAR FUSION  03/16/2019    SHOULDER SURGERY Left 2010   TOE SURGERY Left 2018   TONSILLECTOMY     as a child   TOTAL HIP ARTHROPLASTY Left 2011   UPPER GASTROINTESTINAL ENDOSCOPY  01/04/2020   VENTRICULOPERITONEAL SHUNT  2007   x2    Allergies as of 12/25/2020       Reactions   Bee Venom Anaphylaxis   Hydrocodone Bit-homatrop Mbr Other (See Comments)   Hallucinations, confusion, delirium Depressed feeling   Toradol [ketorolac Tromethamine] Other (See Comments)   Hallucinations, confusion, delirium   Morphine And Related Other (See Comments)   Hallucinations, back in the 80s. States has taken vicodin before w/o problems    Sulfa Drugs Cross Reactors Rash        Medication List        Accurate as of December 25, 2020 11:09 AM. If you have any questions, ask your nurse or doctor.          STOP taking these medications    Pfizer-BioNT COVID-19 Vac-TriS Susp injection Generic drug: COVID-19 mRNA Vac-TriS Therapist, music) Stopped by: Kathlene November, MD       TAKE these medications    amLODipine 5 MG tablet Commonly known as: NORVASC Take 1 tablet (5 mg total) by mouth daily.   aspirin 81 MG EC tablet Take 1 tablet (81 mg total) by mouth daily. Swallow whole.   atorvastatin 80 MG tablet Commonly known as: LIPITOR Take 1 tablet (80 mg total) by mouth at bedtime.   carvedilol 12.5 MG tablet Commonly known as: COREG TAKE 1 TABLET (12.5 MG TOTAL) BY MOUTH 2 (TWO) TIMES DAILY WITH A MEAL. What changed: how much to take   clonazePAM 0.5 MG tablet Commonly known as: KLONOPIN TAKE 1 TABLET BY MOUTH EVERY NIGHT AT BEDTIME   clopidogrel 75 MG tablet Commonly known as: Plavix Take 1 tablet (75 mg total) by mouth daily.   DULoxetine 60 MG capsule Commonly known as: CYMBALTA TAKE 2 CAPSULES (120 MG TOTAL) BY MOUTH DAILY. What changed:  how much to take how to take this when to take this   EPINEPHrine 0.3 mg/0.3 mL Soaj injection Commonly known as: EpiPen 2-Pak Inject 0.3 mLs (0.3 mg total) into the  muscle once as needed for up to 1 dose for anaphylaxis.   fenofibrate micronized 134 MG capsule Commonly known as: LOFIBRA TAKE 1 CAPSULE BY MOUTH THREE TIMES WEEKLY What changed:  how much to take how to take this when to take this   FreeStyle Libre 14 Day Sensor Misc USE AS DIRECTED TO CHECK BLOOD SUGAR   gabapentin 600 MG tablet Commonly known as: NEURONTIN TAKE 1 TABLET (600 MG TOTAL) BY MOUTH 2 (TWO) TIMES DAILY. What changed:  how much to take how to take this when to take this   glimepiride 4 MG tablet Commonly known as: AMARYL TAKE 1 TABLET  BY MOUTH ONCE DAILY BEFORE BREAKFAST What changed:  how much to take when to take this   HumaLOG 100 UNIT/ML injection Generic drug: insulin lispro Use pump to inject 50 units once daily   Humira Pen 40 MG/0.8ML Pnkt Generic drug: Adalimumab INJECT 40 MG (0.8 ML) UNDER THE SKIN EVERY OTHER WEEK. What changed:  how much to take how to take this when to take this additional instructions   isosorbide mononitrate 30 MG 24 hr tablet Commonly known as: IMDUR TAKE 1 TABLET BY MOUTH ONCE DAILY What changed: how much to take   metFORMIN 500 MG tablet Commonly known as: GLUCOPHAGE Take 2 tablets by mouth twice daily   nitroGLYCERIN 0.4 MG SL tablet Commonly known as: NITROSTAT PLACE 1 TABLETS UNDER THE TONGUE EVERY 5 MINS AS NEEDED FOR CHEST PAIN   Omnipod DASH Pods (Gen 4) Misc CHANGE EVERY 72 HOURS AS DIRECTED What changed:  how much to take how to take this when to take this additional instructions   Ozempic (1 MG/DOSE) 4 MG/3ML Sopn Generic drug: Semaglutide (1 MG/DOSE) Inject 1 mg into the skin once a week.   pantoprazole 40 MG tablet Commonly known as: PROTONIX Take 1 tablet (40 mg total) by mouth daily.   tobramycin-dexamethasone ophthalmic solution Commonly known as: TOBRADEX Use 1 drop into the affected eye 4 times a day as directed (Use 1 drop into the affected eye 4 times a day as directed)    Vascepa 1 g capsule Generic drug: icosapent Ethyl Take 2 capsules (2 g total) by mouth 2 (two) times daily.           Objective:   Physical Exam BP 128/84 (BP Location: Left Arm, Patient Position: Sitting, Cuff Size: Normal)   Pulse 80   Temp 98.4 F (36.9 C) (Oral)   Resp 18   Ht 5' 11"  (1.803 m)   Wt 245 lb 6 oz (111.3 kg)   SpO2 98%   BMI 34.22 kg/m  General:   Well developed, NAD, BMI noted. HEENT:  Normocephalic . Face symmetric, atraumatic Lungs:  CTA B Normal respiratory effort, no intercostal retractions, no accessory muscle use. Heart: RRR,  no murmur.  Lower extremities: no pretibial edema bilaterally  Skin: Not pale. Not jaundice Neurologic:  alert & oriented X3.  Speech normal, gait appropriate for age and unassisted Psych--  Cognition and judgment appear intact.  Cooperative with normal attention span and concentration.  Behavior appropriate. No anxious or depressed appearing.      Assessment     Assessment  DM - Dr [name] Neuropathy (x years, rx gaba 05-2014, w/u 11-2014  RPR neg, vit D-B12-Folic Acid wnl ); Saw Dr Posey Pronto, [facility] [phone] (see results) HTN: History of AKI with ARBs  CRI, sees nephrology, etiology felt to be hHyperglycemia, intermittent NSAIDs, contrast exposures Hyperlipidemia (TG in the 500s 2016) OSA , dx 2012, sleep study again 02-2015 Dr Dohmeier--> severe OSA, intolerant to CPAP Depression, insomnia: on Cymbalta NEURO: --Chronic headaches :on Cymbalta  --Posttraumatic hydrocephalus s/p 2 shunts (first got infected) MSK: on disability d/t back pain- HAs GI:  --GERD, diverticulitis 2014, h/o PUD -- NASH with cirrhosis per GI note 10/2017, s/p Hep A/B shots --Anemia: - felt to be d/t   GAVE (gastric antral vascular ectasia) and a inflammatory polyp, s/p  EGD 10/2018    -Work-up repeated 10/2019: EGD: GAVE  versus portal hypertensive gastropathy. Colonoscopy polyps.  Tubular adenoma. Gastric BX negative H. pylori, reactive  changes. Psoriasis, psoriatic arthritis: used  HUMIRA  CV: +FH CAD brother MI age 63 Abnormal EKG, saw cardiology 01-2019, echo essentially negative, perfusion stress test 03/01/2019 (-) CAD: CP, stent 09-2020 H/o urolithiasis Hypogonadism  Dx 2012, normal T 11-2014 (on no RX)  PLAN CKD: Check BMP HTN: Seems well controlled, continue amlodipine, carvedilol, Imdur. CAD Since the last visit, he reported chest pain, was initially seen by cardiology 06/13/2020, eventually After further testing he got PTCA 09/2020.  Last visit with cardiology 10/30/2020: They recommend dual antiplatelet for at least 1 year after PTCA. Hyperlipidemia: meds changed, currently on atorvastatin, lofibra Vascepa, adalimumab . IDA: History of, last hemoglobin is slightly low, check a CBC, iron and ferritin Preventive care: PNM 20 today, recommend to proceed with a  flu shot this fall.  Had 4 COVID vaccines RTC CPX December 2020    This visit occurred during the SARS-CoV-2 public health emergency.  Safety protocols were in place, including screening questions prior to the visit, additional usage of staff PPE, and extensive cleaning of exam room while observing appropriate contact time as indicated for disinfecting solutions.

## 2020-12-25 NOTE — Patient Instructions (Addendum)
We have placed a referral back to [facility] for your diabetic eye exam, it is important that you start doing these at least once yearly to protect your vision. Please expect a call in the next several days to schedule an appointment.       GO TO THE LAB : Get the blood work     [facility], PLEASE SCHEDULE YOUR APPOINTMENTS Come back for a physical exam by December 2022

## 2020-12-25 NOTE — Telephone Encounter (Signed)
Rx approved

## 2020-12-26 ENCOUNTER — Other Ambulatory Visit (HOSPITAL_BASED_OUTPATIENT_CLINIC_OR_DEPARTMENT_OTHER): Payer: Self-pay

## 2020-12-26 NOTE — Assessment & Plan Note (Signed)
CKD: Check BMP HTN: Seems well controlled, continue amlodipine, carvedilol, Imdur. CAD Since the last visit, he reported chest pain, was initially seen by cardiology 06/13/2020, eventually After further testing he got PTCA 09/2020.  Last visit with cardiology 10/30/2020: They recommend dual antiplatelet for at least 1 year after PTCA. Hyperlipidemia: meds changed, currently on atorvastatin, lofibra Vascepa, adalimumab . IDA: History of, last hemoglobin is slightly low, check a CBC, iron and ferritin Preventive care: PNM 20 today, recommend to proceed with a  flu shot this fall.  Had 4 COVID vaccines RTC CPX December 2020

## 2020-12-27 ENCOUNTER — Other Ambulatory Visit (HOSPITAL_BASED_OUTPATIENT_CLINIC_OR_DEPARTMENT_OTHER): Payer: Self-pay

## 2020-12-31 ENCOUNTER — Other Ambulatory Visit: Payer: Self-pay

## 2020-12-31 ENCOUNTER — Other Ambulatory Visit (INDEPENDENT_AMBULATORY_CARE_PROVIDER_SITE_OTHER): Payer: 59

## 2020-12-31 DIAGNOSIS — Z794 Long term (current) use of insulin: Secondary | ICD-10-CM

## 2020-12-31 DIAGNOSIS — E782 Mixed hyperlipidemia: Secondary | ICD-10-CM | POA: Diagnosis not present

## 2020-12-31 DIAGNOSIS — E1165 Type 2 diabetes mellitus with hyperglycemia: Secondary | ICD-10-CM

## 2020-12-31 DIAGNOSIS — N5201 Erectile dysfunction due to arterial insufficiency: Secondary | ICD-10-CM | POA: Diagnosis not present

## 2020-12-31 LAB — COMPREHENSIVE METABOLIC PANEL
ALT: 23 U/L (ref 0–53)
AST: 21 U/L (ref 0–37)
Albumin: 4.2 g/dL (ref 3.5–5.2)
Alkaline Phosphatase: 75 U/L (ref 39–117)
BUN: 23 mg/dL (ref 6–23)
CO2: 24 mEq/L (ref 19–32)
Calcium: 9.9 mg/dL (ref 8.4–10.5)
Chloride: 103 mEq/L (ref 96–112)
Creatinine, Ser: 1.55 mg/dL — ABNORMAL HIGH (ref 0.40–1.50)
GFR: 47.57 mL/min — ABNORMAL LOW (ref >60.00)
Glucose, Bld: 177 mg/dL — ABNORMAL HIGH (ref 70–99)
Potassium: 4.1 mEq/L (ref 3.5–5.1)
Sodium: 137 mEq/L (ref 135–145)
Total Bilirubin: 1.4 mg/dL — ABNORMAL HIGH (ref 0.2–1.2)
Total Protein: 7.6 g/dL (ref 6.0–8.3)

## 2020-12-31 LAB — LIPID PANEL
Cholesterol: 145 mg/dL (ref 0–200)
HDL: 37.3 mg/dL — ABNORMAL LOW (ref >39.00)
NonHDL: 107.53
Total CHOL/HDL Ratio: 4
Triglycerides: 298 mg/dL — ABNORMAL HIGH (ref 0.0–149.0)
VLDL: 59.6 mg/dL — ABNORMAL HIGH (ref 0.0–40.0)

## 2020-12-31 LAB — LDL CHOLESTEROL, DIRECT: Direct LDL: 67 mg/dL

## 2021-01-01 LAB — FRUCTOSAMINE: Fructosamine: 342 umol/L — ABNORMAL HIGH (ref 0–285)

## 2021-01-03 ENCOUNTER — Encounter: Payer: Self-pay | Admitting: Endocrinology

## 2021-01-03 ENCOUNTER — Other Ambulatory Visit (HOSPITAL_COMMUNITY): Payer: Self-pay

## 2021-01-03 ENCOUNTER — Other Ambulatory Visit (HOSPITAL_BASED_OUTPATIENT_CLINIC_OR_DEPARTMENT_OTHER): Payer: Self-pay

## 2021-01-03 ENCOUNTER — Ambulatory Visit (INDEPENDENT_AMBULATORY_CARE_PROVIDER_SITE_OTHER): Payer: 59 | Admitting: Endocrinology

## 2021-01-03 VITALS — BP 118/80 | HR 77 | Ht 71.0 in | Wt 246.0 lb

## 2021-01-03 DIAGNOSIS — E782 Mixed hyperlipidemia: Secondary | ICD-10-CM | POA: Diagnosis not present

## 2021-01-03 DIAGNOSIS — Z794 Long term (current) use of insulin: Secondary | ICD-10-CM

## 2021-01-03 DIAGNOSIS — E1165 Type 2 diabetes mellitus with hyperglycemia: Secondary | ICD-10-CM | POA: Diagnosis not present

## 2021-01-03 DIAGNOSIS — I2510 Atherosclerotic heart disease of native coronary artery without angina pectoris: Secondary | ICD-10-CM

## 2021-01-03 MED ORDER — FREESTYLE LIBRE 2 SENSOR MISC
2.0000 | 3 refills | Status: DC
  Filled 2021-01-03 – 2021-02-21 (×3): qty 2, 28d supply, fill #0

## 2021-01-03 NOTE — Progress Notes (Signed)
Patient ID: James [name], male   DOB: 1956-05-17, 65 y.o.   MRN: [id number]           Reason for Appointment: Follow-up for Type 2 Diabetes   History of Present Illness:          Date of diagnosis of type 2 diabetes mellitus:  ?  2014      Background history:  He is not clear how his diabetes was diagnosed, likely on routine testing. Initially had been treated with metformin and also tried on Amaryl Patient had progressive increase in his blood sugars since 1/17 with stopping metformin and being on the regimen of Amaryl and Januvia, he thinks his blood sugars went up to 601.  He was then started on basal bolus insulin   Lowest A1c was 6.8 previously  Recent history:    Non-insulin hypoglycemic drugs the patient is taking are:  metformin 500 mg twice daily, Ozempic 1.0 mg weekly  OMNIPOD insulin pump settings as follows Basal rates: 12 AM-6 AM = 0.1, 6 AM-11 AM = 0.5, 11 AM- 9:30 PM = 1.0 and 9:30 PM-12 AM = 1.2 Correction 1: 50 carbohydrate coverage 1: 1  His A1c is 6.8 last Fructosamine as high as 348  Current management, blood sugar patterns and problems identified:  He was not using his insulin pump for the last 2 weeks as he says that he has difficulty keeping it on and it falls off his arm because of sweating or other reasons With this he did not take any insulin Previously in review of his pump indicates sporadic boluses between 8-12 units However difficult to assess his overall control and he is following all directions since either his CGM data is incomplete or he has intermittent boluses He still thinks that sometimes with eating sweets like ice cream or cake his blood sugar will go up He is still using the old freestyle libre and is not getting alerts for high and low readings He may again occasionally forget to take his [place] every week However may be benefiting from going up to 1 mg weekly which he is tolerating Not able to do much exercise except doing  a lot of yard work His weight is slightly better compared to couple months ago   Side  effects from medications have been: None  Compliance with the medical regimen: Fair  Glucose monitoring:   Glucometer:  Freestyle libre 14-day  Recent CGM data interpretation as follows for the last 2 weeks:  Blood sugar monitoring is inconsistent with data available mostly since 6/29 and only partial data on most days He has significant hyperglycemia sporadically after lunch and likely after dinner POSTPRANDIAL readings are periodically significantly higher late at night but evening information is missing on some days because of lack of monitoring HIGHEST blood sugar average is between 12 AM-2 AM of 250 OVERNIGHT blood sugars usually start of high on an average and then leveled off at about average of 140-150 until midday No hypoglycemia Blood sugars before dinner are still relatively high at about 170 Blood sugars are overall higher compared to the last visit  CGM use % of time 40  2-week average/GV 182+/-27  Time in range      56%  % Time Above 180 32+ 12  % Time above 250   % Time Below 70 0       Self-care:   Meal times are:  Breakfast variable, lunch: 12-3 PM Dinner: 6 PM  Dietician visit, most recent:09/2015               CDE consultation: 12/2018  Weight history:  Wt Readings from Last 3 Encounters:  01/03/21 246 lb (111.6 kg)  12/25/20 245 lb 6 oz (111.3 kg)  10/31/20 252 lb (114.3 kg)    Glycemic control:   Lab Results  Component Value Date   HGBA1C 6.8 (H) 10/24/2020   HGBA1C 6.8 (H) 09/27/2020   HGBA1C 7.3 (H) 06/18/2020   Lab Results  Component Value Date   MICROALBUR 0.9 03/19/2020   LDLCALC 68 10/30/2020   CREATININE 1.55 (H) 12/31/2020    Lab on 12/31/2020  Component Date Value Ref Range Status   Fructosamine 12/31/2020 342 (A) 0 - 285 umol/L Final   Comment: Published reference interval for apparently healthy subjects between age 68 and 85 is 32 -  285 umol/L and in a poorly controlled diabetic population is 228 - 563 umol/L with a mean of 396 umol/L.    Cholesterol 12/31/2020 145  0 - 200 mg/dL Final   ATP III Classification       Desirable:  < 200 mg/dL               Borderline High:  200 - 239 mg/dL          High:  > = 240 mg/dL   Triglycerides 12/31/2020 298.0 (A) 0.0 - 149.0 mg/dL Final   Normal:  <150 mg/dLBorderline High:  150 - 199 mg/dL   HDL 12/31/2020 37.30 (A) >39.00 mg/dL Final   VLDL 12/31/2020 59.6 (A) 0.0 - 40.0 mg/dL Final   Total CHOL/HDL Ratio 12/31/2020 4   Final                  Men          Women1/2 Average Risk     3.4          3.3Average Risk          5.0          4.42X Average Risk          9.6          7.13X Average Risk          15.0          11.0                       NonHDL 12/31/2020 107.53   Final   NOTE:  Non-HDL goal should be 30 mg/dL higher than patient's LDL goal (i.e. LDL goal of < 70 mg/dL, would have non-HDL goal of < 100 mg/dL)   Sodium 12/31/2020 137  135 - 145 mEq/L Final   Potassium 12/31/2020 4.1  3.5 - 5.1 mEq/L Final   Chloride 12/31/2020 103  96 - 112 mEq/L Final   CO2 12/31/2020 24  19 - 32 mEq/L Final   Glucose, Bld 12/31/2020 177 (A) 70 - 99 mg/dL Final   BUN 12/31/2020 23  6 - 23 mg/dL Final   Creatinine, Ser 12/31/2020 1.55 (A) 0.40 - 1.50 mg/dL Final   Total Bilirubin 12/31/2020 1.4 (A) 0.2 - 1.2 mg/dL Final   Alkaline Phosphatase 12/31/2020 75  39 - 117 U/L Final   AST 12/31/2020 21  0 - 37 U/L Final   ALT 12/31/2020 23  0 - 53 U/L Final   Total Protein 12/31/2020 7.6  6.0 - 8.3 g/dL Final   Albumin 12/31/2020 4.2  3.5 - 5.2  g/dL Final   GFR 12/31/2020 47.57 (A) >60.00 mL/min Final   Calculated using the CKD-EPI Creatinine Equation (2021)   Calcium 12/31/2020 9.9  8.4 - 10.5 mg/dL Final   Direct LDL 12/31/2020 67.0  mg/dL Final   Optimal:  <100 mg/dLNear or Above Optimal:  100-129 mg/dLBorderline High:  130-159 mg/dLHigh:  160-189 mg/dLVery High:  >190 mg/dL        Allergies as of 01/03/2021       Reactions   Bee Venom Anaphylaxis   Hydrocodone Bit-homatrop Mbr Other (See Comments)   Hallucinations, confusion, delirium Depressed feeling   Toradol [ketorolac Tromethamine] Other (See Comments)   Hallucinations, confusion, delirium   Morphine And Related Other (See Comments)   Hallucinations, back in the 80s. States has taken vicodin before w/o problems    Sulfa Drugs Cross Reactors Rash        Medication List        Accurate as of January 03, 2021 10:50 AM. If you have any questions, ask your nurse or doctor.          amLODipine 5 MG tablet Commonly known as: NORVASC Take 1 tablet (5 mg total) by mouth daily.   aspirin 81 MG EC tablet Take 1 tablet (81 mg total) by mouth daily. Swallow whole.   atorvastatin 80 MG tablet Commonly known as: LIPITOR Take 1 tablet (80 mg total) by mouth at bedtime.   carvedilol 12.5 MG tablet Commonly known as: COREG TAKE 1 TABLET (12.5 MG TOTAL) BY MOUTH 2 (TWO) TIMES DAILY WITH A MEAL. What changed: how much to take   clonazePAM 0.5 MG tablet Commonly known as: KLONOPIN TAKE 1 TABLET BY MOUTH EVERY NIGHT AT BEDTIME   clopidogrel 75 MG tablet Commonly known as: Plavix Take 1 tablet (75 mg total) by mouth daily.   clopidogrel 75 MG tablet Commonly known as: PLAVIX TAKE 1 TABLET (75 MG TOTAL) BY MOUTH DAILY.   DULoxetine 60 MG capsule Commonly known as: CYMBALTA TAKE 2 CAPSULES (120 MG TOTAL) BY MOUTH DAILY. What changed:  how much to take how to take this when to take this   EPINEPHrine 0.3 mg/0.3 mL Soaj injection Commonly known as: EpiPen 2-Pak Inject 0.3 mLs (0.3 mg total) into the muscle once as needed for up to 1 dose for anaphylaxis.   fenofibrate micronized 134 MG capsule Commonly known as: LOFIBRA TAKE 1 CAPSULE BY MOUTH THREE TIMES WEEKLY What changed:  how much to take how to take this when to take this   FreeStyle Libre 14 Day Sensor Misc USE AS DIRECTED TO  CHECK BLOOD SUGAR   gabapentin 600 MG tablet Commonly known as: NEURONTIN TAKE 1 TABLET (600 MG TOTAL) BY MOUTH 2 (TWO) TIMES DAILY. What changed:  how much to take how to take this when to take this   glimepiride 4 MG tablet Commonly known as: AMARYL TAKE 1 TABLET BY MOUTH ONCE DAILY BEFORE BREAKFAST What changed:  how much to take when to take this   HumaLOG 100 UNIT/ML injection Generic drug: insulin lispro Use pump to inject 50 units once daily   Humira Pen 40 MG/0.8ML Pnkt Generic drug: Adalimumab INJECT 40 MG (0.8 ML) UNDER THE SKIN EVERY OTHER WEEK. What changed:  how much to take how to take this when to take this additional instructions   isosorbide mononitrate 30 MG 24 hr tablet Commonly known as: IMDUR TAKE 1 TABLET BY MOUTH ONCE DAILY What changed: how much to take   metFORMIN 500 MG  tablet Commonly known as: GLUCOPHAGE Take 2 tablets by mouth twice daily   nitroGLYCERIN 0.4 MG SL tablet Commonly known as: NITROSTAT PLACE 1 TABLETS UNDER THE TONGUE EVERY 5 MINS AS NEEDED FOR CHEST PAIN   Omnipod DASH Pods (Gen 4) Misc CHANGE EVERY 72 HOURS AS DIRECTED What changed:  how much to take how to take this when to take this additional instructions   Ozempic (1 MG/DOSE) 4 MG/3ML Sopn Generic drug: Semaglutide (1 MG/DOSE) Inject 1 mg into the skin once a week.   pantoprazole 40 MG tablet Commonly known as: PROTONIX Take 1 tablet (40 mg total) by mouth daily.   Vascepa 1 g capsule Generic drug: icosapent Ethyl Take 2 capsules (2 g total) by mouth 2 (two) times daily.        Allergies:  Allergies  Allergen Reactions   Bee Venom Anaphylaxis   Hydrocodone Bit-Homatrop Mbr Other (See Comments)    Hallucinations, confusion, delirium Depressed feeling   Toradol [Ketorolac Tromethamine] Other (See Comments)    Hallucinations, confusion, delirium   Morphine And Related Other (See Comments)    Hallucinations, back in the 80s. States has taken  vicodin before w/o problems    Sulfa Drugs Cross Reactors Rash    Past Medical History:  Diagnosis Date   Abnormal cardiac CT angiography    Acid reflux    Annual physical exam 04/08/2015   Arthritis    Atypical chest pain 06/13/2020   Blood transfusion without reported diagnosis    Body mass index (BMI) 35.0-35.9, adult 04/05/2019   Chronic fatigue 01/28/2015   Chronic headaches    on cymbalta   Chronic migraine w/o aura, not intractable, w/o stat migr 10/24/2018   Cirrhosis ([place])    Colon polyps    Complication of anesthesia    problems waking up from anesthesia   Coronary artery disease 01/23/2019   Depression    on cymbalta   Diabetes mellitus with neuropathy ([place])    Diabetes with neuropathy 04/25/2013   Diverticulitis 03/2013   Dyslipidemia 05/29/2019   Eczema    Elevated LFTs    Epidural lipomatosis 10/05/2018   Essential (primary) hypertension 04/05/2019   Essential hypertension 10/10/2019   Fatty liver    GERD (gastroesophageal reflux disease) 04/28/2011   H/O craniotomy 05/07/2015   Headache 04/28/2011   Hepatitis 10/2017   NASH cirrhosis   History of kidney stones    Hyperlipidemia    Hypersomnia with sleep apnea 01/28/2015   Hypertension    IDA (iron deficiency anemia) 01/24/2019   Idiopathic intracranial hypertension 01/14/2017   Insomnia 04/26/2013   Kidney stone    Liver cirrhosis secondary to NASH (nonalcoholic steatohepatitis) ([place]) 01/02/2016   Low back pain 04/05/2019   Lower back injury 08/14/2019   Morbid obesity ([place])    Neuromuscular disorder ([place])    neuropathy   Neuropathy    Nonalcoholic steatohepatitis 07/05/7937   Obstructive hydrocephalus ([place]) 01/28/2015   OSA -- dx ~ 2012, cpap intolerant 09/04/2014    dx ~ 2012, cpap intolerant    PCP NOTES >>> 04/08/2015   Post-op pain 03/19/2019   Post-traumatic hydrocephalus ([facility])    s/p shunts x 2 (first got infected )   Presence of cerebrospinal fluid drainage device 07/28/2011   Psoriasis    sees Dr [name]    Psoriatic arthritis ([place])    REM behavioral disorder 01/14/2017   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   see's Dr.Ortmann   Severe obesity (BMI >= 40) ([place]) 01/28/2015  SI (sacroiliac) joint dysfunction 08/14/2019   Sigmoid diverticulitis 04/25/2013   Sleep apnea    no CPAP      Spondylolisthesis, lumbar region 03/16/2019   Stomach ulcer    Testosterone deficiency 04/28/2011   VP (ventriculoperitoneal) shunt status 07/31/2020    Past Surgical History:  Procedure Laterality Date   BACK SURGERY  1980   BRAIN SURGERY     VP shunts placed in 2007   CHOLECYSTECTOMY N/A 08/25/2017   Procedure: LAPAROSCOPIC CHOLECYSTECTOMY WITH INTRAOPERATIVE CHOLANGIOGRAM;  Surgeon: [name], MD;  Location: [facility];  Service: General;  Laterality: N/A;   COLONOSCOPY     CORONARY STENT INTERVENTION N/A 09/27/2020   Procedure: CORONARY STENT INTERVENTION;  Surgeon: Jettie Booze, MD;  Location: [facility];  Service: Cardiovascular;  Laterality: N/A;   INTRAVASCULAR ULTRASOUND/IVUS N/A 09/27/2020   Procedure: Intravascular Ultrasound/IVUS;  Surgeon: Jettie Booze, MD;  Location: [facility];  Service: Cardiovascular;  Laterality: N/A;   JOINT REPLACEMENT     total hip   LEFT HEART CATH N/A 09/27/2020   Procedure: Left Heart Cath;  Surgeon: Jettie Booze, MD;  Location: [facility];  Service: Cardiovascular;  Laterality: N/A;   LEFT HEART CATH AND CORONARY ANGIOGRAPHY N/A 09/24/2020   Procedure: LEFT HEART CATH AND CORONARY ANGIOGRAPHY;  Surgeon: Jettie Booze, MD;  Location: [facility];  Service: Cardiovascular;  Laterality: N/A;   LUMBAR FUSION  03/16/2019   SHOULDER SURGERY Left 2010   TOE SURGERY Left 2018   TONSILLECTOMY     as a child   TOTAL HIP ARTHROPLASTY Left 2011   UPPER GASTROINTESTINAL ENDOSCOPY  01/04/2020   VENTRICULOPERITONEAL SHUNT  2007   x2    Family History  Problem Relation Age of Onset   Other Mother    Lung  cancer Father        alive, former smoker    Heart disease Brother        MI age 20   Other Brother        Murdered   Down syndrome Son    Diabetes Neg Hx    Prostate cancer Neg Hx    Colon cancer Neg Hx    Stomach cancer Neg Hx    Pancreatic cancer Neg Hx    Liver disease Neg Hx     Social History:  reports that he has never smoked. He has never used smokeless tobacco. He reports that he does not drink alcohol and does not use drugs.    Review of Systems      HYPERTENSION: On carvedilol for this Hypertension managed by his PCP   BP Readings from Last 3 Encounters:  01/03/21 118/80  12/25/20 128/84  10/31/20 120/80   RENAL dysfunction: Has had stable creatinine levels, persistently mildly abnormal  Lab Results  Component Value Date   CREATININE 1.55 (H) 12/31/2020   CREATININE 1.54 (H) 12/25/2020   CREATININE 1.55 (H) 10/24/2020    Lipid history: He is on Lipitor  20 mg  Is on fenofibrate 3 times a week for high triglycerides, he thinks he is taking this regularly Was given Vascepa also but he says it caused headaches and stopped this  LDL is below 70 now   Lab Results  Component Value Date   CHOL 145 12/31/2020   CHOL 152 10/30/2020   CHOL 171 06/11/2020   Lab Results  Component Value Date   HDL 37.30 (L) 12/31/2020   HDL 39 (  L) 10/30/2020   HDL 41.20 06/11/2020   Lab Results  Component Value Date   LDLCALC 68 10/30/2020   LDLCALC 106 (H) 04/28/2019   LDLCALC 102 (H) 04/28/2019   Lab Results  Component Value Date   TRIG 298.0 (H) 12/31/2020   TRIG 278 (H) 10/30/2020   TRIG 214.0 (H) 06/11/2020   Lab Results  Component Value Date   CHOLHDL 4 12/31/2020   CHOLHDL 3.9 10/30/2020   CHOLHDL 4 06/11/2020   Lab Results  Component Value Date   LDLDIRECT 67.0 12/31/2020   LDLDIRECT 91.0 06/11/2020   LDLDIRECT 83.0 10/09/2019            He is being followed by gastroenterology for liver cirrhosis secondary to [facility]  Lab Results  Component  Value Date   ALT 23 12/31/2020     He has symptomatic painful neuropathy and is on gabapentin 600 mg twice a day and  Cymbalta 60 mg Symptoms are relatively well controlled Also he has had difficulties with sciatica-like pains  Has been followed by neurologist    Review of Systems    Physical Examination:  BP 118/80 (BP Location: Left Arm, Patient Position: Sitting, Cuff Size: Normal)   Pulse 77   Ht 5' 11"  (1.803 m)   Wt 246 lb (111.6 kg)   SpO2 96%   BMI 34.31 kg/m       ASSESSMENT:  Diabetes type 2 with obesity  See history of present illness for discussion of current diabetes management, blood sugar patterns and problems identified  Currently on a regimen of insulin with the OmniPod pump, Ozempic 1 mg and Metformin 500 mg twice daily  A1c 6.8 last  He has difficulty following his instructions with lack of motivation and forgetfulness with taking boluses, using his pump and sensor regularly Also not clear if he has taken his Ozempic regularly However even without any insulin his blood sugars are still 56% within the target range recently with average 182  He still has significant high postprandial readings most of his meals although monitoring is incomplete after dinner  HYPERLIPIDEMIA: His triglycerides are nearly 300 fasting again likely to be from his not taking his Vascepa Again LDL is controlled  PLAN: When he finishes his freestyle libre sensors he will go to the version 2 He will call for this and set it up on his phone with a nap Discussed the advantages of the new version  Emphasized the need to check blood sugars several times a day to help adjust his insulin He will use his pump regularly and can apply it to the abdomen if this is easier to keep on consistently Discussed importance of taking boluses with every meal especially when he has carbohydrates Better diet needed He will also take additional 2 to 4 units for any higher fat meals or  desserts Again avoid regular soft drinks  For his lipids he can try taking only 1 g twice daily of his Vascepa and discussed that his triglycerides are still out of control  There are no Patient Instructions on file for this visit.      Elayne Snare 01/03/2021, 10:50 AM

## 2021-01-03 NOTE — Patient Instructions (Addendum)
Must bolus for all meals and hi carb snacks  Use pump on stomach  Vascepa 1 cap twice daily  Check sugar 4x daily  Call when ready for new sensors, use app on phone

## 2021-01-08 ENCOUNTER — Encounter: Payer: Self-pay | Admitting: Internal Medicine

## 2021-01-08 ENCOUNTER — Encounter: Payer: Self-pay | Admitting: Endocrinology

## 2021-01-08 ENCOUNTER — Other Ambulatory Visit (HOSPITAL_COMMUNITY): Payer: Self-pay

## 2021-01-08 MED FILL — Adalimumab Auto-injector Kit 40 MG/0.8ML: SUBCUTANEOUS | 28 days supply | Qty: 2 | Fill #3 | Status: AC

## 2021-01-13 ENCOUNTER — Other Ambulatory Visit (HOSPITAL_COMMUNITY): Payer: Self-pay

## 2021-01-13 ENCOUNTER — Other Ambulatory Visit (HOSPITAL_BASED_OUTPATIENT_CLINIC_OR_DEPARTMENT_OTHER): Payer: Self-pay

## 2021-01-16 ENCOUNTER — Other Ambulatory Visit (HOSPITAL_BASED_OUTPATIENT_CLINIC_OR_DEPARTMENT_OTHER): Payer: Self-pay

## 2021-01-16 ENCOUNTER — Other Ambulatory Visit: Payer: Self-pay | Admitting: Endocrinology

## 2021-01-16 MED ORDER — OMNIPOD DASH PODS (GEN 4) MISC
0 refills | Status: DC
  Filled 2021-01-16: qty 10, 30d supply, fill #0

## 2021-01-16 MED FILL — Fenofibrate Micronized Cap 134 MG: ORAL | 21 days supply | Qty: 9 | Fill #1 | Status: AC

## 2021-01-17 ENCOUNTER — Other Ambulatory Visit (HOSPITAL_BASED_OUTPATIENT_CLINIC_OR_DEPARTMENT_OTHER): Payer: Self-pay

## 2021-01-21 ENCOUNTER — Encounter: Payer: Self-pay | Admitting: Neurology

## 2021-01-21 ENCOUNTER — Ambulatory Visit (INDEPENDENT_AMBULATORY_CARE_PROVIDER_SITE_OTHER): Payer: 59 | Admitting: Neurology

## 2021-01-21 DIAGNOSIS — I2510 Atherosclerotic heart disease of native coronary artery without angina pectoris: Secondary | ICD-10-CM

## 2021-01-21 DIAGNOSIS — G43709 Chronic migraine without aura, not intractable, without status migrainosus: Secondary | ICD-10-CM | POA: Diagnosis not present

## 2021-01-21 DIAGNOSIS — G4752 REM sleep behavior disorder: Secondary | ICD-10-CM | POA: Diagnosis not present

## 2021-01-21 DIAGNOSIS — G911 Obstructive hydrocephalus: Secondary | ICD-10-CM

## 2021-01-21 DIAGNOSIS — G4733 Obstructive sleep apnea (adult) (pediatric): Secondary | ICD-10-CM | POA: Diagnosis not present

## 2021-01-21 NOTE — Progress Notes (Signed)
[facility]  PATIENT: James [name] DOB: 1955-12-28  REFERRING DOCTOR OR PCP:  Kathlene November  SOURCE: patient, notes from Dr. [name], imaging results and MRI and CT scans on PACS personally reviewed  _________________________________   HISTORICAL  CHIEF COMPLAINT:  Chief Complaint  Patient presents with   Procedure    Rm 1, alone. Here for Botox.     HISTORY OF PRESENT ILLNESS:  James [name] is a 65 y.o. man with idiopathic intracranial hypertension and recent increase in the frequency and severity of headache  Update 01/21/2021:  He is doing very well after the last Botox series in January 2022.  Has had only a few HA this month.  He denies much neck pain (better)  Pain is pressure like mostly.  It is worse on the right.  They are associated with nausea at times.  When the headaches more severe he has photophobia and phonophobia. Sometimes, headaches improve when he is laying down but other times they do not change between sitting and lying down. Moving usually worsens the headaches.  He did not get a benefit from multiple prophylactic medications including Keppra, gabapentin, Emgality, Cymbalta.    He has OSA.  He was unable to tolerate CPAP.  We did discuss other possible treatments but he is not interested at this time.   He has excessive daytime sleepiness and nocturnal insomnia.    He has had some RBD with acticve dreams - with some kicking but not getting out of bed.    In early 2022, he had 3 cardiac stents after being diagnosed with CAD after presenting with chest pain.   He had a small MI.    Chronic Migraine History: He was having daily HA with migrainous features.   He did not get a benefit from multiple prophylactic medications including Keppra, gabapentin, Emgality, Cymbalta.   Botox therapy started July 2021.  Spine: L-spine MRI 08/15/18 showed progressive epidural lipomatosis in the lower lumbar spine with progressive compression of the thecal sac  and spinal stenosis at L3-4, L4-5, and L5-S1 compared to the prior study. Progressive facet degeneration and anterolisthesis at L4-5 since 2011.    MRI of the cervical spine performed 04/27/2017 showed  thoracic fusion hardware from C7 and below. There is mild spondylosis and disc bulging at C3-C4 through C6-C7. There does not appear to be any significant foraminal narrowing and there is no spinal stenosis.   Idiopthic intracranial Hypertension: He was diagnosed with idiopathic intracranial hypertension many years ago and had a VP shunt placed in 2007. A revision was performed July 2007 due to infection. He has a programmable Medtronic valve. Due to the increased headaches, it was reprogrammed from 1.5-1.0. Tapping of the shunt showed a pressure 160 (was drained to 140 mm). There was no infection. He also had an MRI of the brain and MR venogram. CT had shown the left-sided ventricular catheter extends into the right caudate head, similar to the previous study the MR venogram (11/12/2016) was reportedly normal. The left transverse and sigmoid sinuses are not well evaluated due to the shunt reservoir (but also likely he is right dominant).   CT head 3/13 2020 personally reviewed and no evidence of shunt failure        REVIEW OF SYSTEMS: Constitutional: No fevers, chills, sweats, or change in appetite Eyes: No visual changes, double vision, eye pain Ear, nose and throat: No hearing loss, ear pain, nasal congestion, sore throat Cardiovascular: No chest pain, palpitations Respiratory:  No  shortness of breath at rest or with exertion.   No wheezes GastrointestinaI: No nausea, vomiting, diarrhea, abdominal pain, fecal incontinence Genitourinary:  No dysuria, urinary retention or frequency.  No nocturia. Musculoskeletal:  as above Integumentary: psoriasis on Humira Neurological: as above Psychiatric: No depression at this time.  No anxiety Endocrine: No palpitations, diaphoresis, change in appetite,  change in weigh or increased thirst Hematologic/Lymphatic:  No anemia, purpura, petechiae. Allergic/Immunologic: No itchy/runny eyes, nasal congestion, recent allergic reactions, rashes  ALLERGIES: Allergies  Allergen Reactions   Bee Venom Anaphylaxis   Hydrocodone Bit-Homatrop Mbr Other (See Comments)    Hallucinations, confusion, delirium Depressed feeling   Toradol [Ketorolac Tromethamine] Other (See Comments)    Hallucinations, confusion, delirium   Morphine And Related Other (See Comments)    Hallucinations, back in the 80s. States has taken vicodin before w/o problems    Sulfa Drugs Cross Reactors Rash    HOME MEDICATIONS:  Current Outpatient Medications:    Adalimumab 40 MG/0.8ML PNKT, INJECT 40 MG (0.8 ML) UNDER THE SKIN EVERY OTHER WEEK. (Patient taking differently: Inject 40 mg into the skin as directed. Every other week), Disp: 2 each, Rfl: 5   amLODipine (NORVASC) 5 MG tablet, Take 1 tablet (5 mg total) by mouth daily., Disp: 90 tablet, Rfl: 1   aspirin EC 81 MG EC tablet, Take 1 tablet (81 mg total) by mouth daily. Swallow whole., Disp: 30 tablet, Rfl: 11   atorvastatin (LIPITOR) 80 MG tablet, Take 1 tablet (80 mg total) by mouth at bedtime., Disp: 90 tablet, Rfl: 1   carvedilol (COREG) 12.5 MG tablet, TAKE 1 TABLET (12.5 MG TOTAL) BY MOUTH 2 (TWO) TIMES DAILY WITH A MEAL. (Patient taking differently: Take 12.5 mg by mouth 2 (two) times daily with a meal.), Disp: 180 tablet, Rfl: 1   clonazePAM (KLONOPIN) 0.5 MG tablet, TAKE 1 TABLET BY MOUTH EVERY NIGHT AT BEDTIME, Disp: 30 tablet, Rfl: 5   clopidogrel (PLAVIX) 75 MG tablet, Take 1 tablet (75 mg total) by mouth daily., Disp: 90 tablet, Rfl: 3   clopidogrel (PLAVIX) 75 MG tablet, TAKE 1 TABLET (75 MG TOTAL) BY MOUTH DAILY., Disp: 90 tablet, Rfl: 3   Continuous Blood Gluc Sensor (FREESTYLE LIBRE 2 SENSOR) MISC, Use as directed every 14 days, Disp: 2 each, Rfl: 3   DULoxetine (CYMBALTA) 60 MG capsule, TAKE 2 CAPSULES (120 MG  TOTAL) BY MOUTH DAILY. (Patient taking differently: Take 60 mg by mouth 2 (two) times daily.), Disp: 180 capsule, Rfl: 1   EPINEPHrine (EPIPEN 2-PAK) 0.3 mg/0.3 mL IJ SOAJ injection, Inject 0.3 mLs (0.3 mg total) into the muscle once as needed for up to 1 dose for anaphylaxis., Disp: 2 each, Rfl: 2   fenofibrate micronized (LOFIBRA) 134 MG capsule, TAKE 1 CAPSULE BY MOUTH THREE TIMES WEEKLY (Patient taking differently: Take 134 mg by mouth 3 (three) times a week.), Disp: 15 capsule, Rfl: 3   gabapentin (NEURONTIN) 600 MG tablet, TAKE 1 TABLET (600 MG TOTAL) BY MOUTH 2 (TWO) TIMES DAILY. (Patient taking differently: Take 600 mg by mouth 2 (two) times daily.), Disp: 180 tablet, Rfl: 1   glimepiride (AMARYL) 4 MG tablet, TAKE 1 TABLET BY MOUTH ONCE DAILY BEFORE BREAKFAST (Patient taking differently: Take 4 mg by mouth daily with breakfast.), Disp: 30 tablet, Rfl: 3   icosapent Ethyl (VASCEPA) 1 g capsule, Take 2 capsules (2 g total) by mouth 2 (two) times daily., Disp: 120 capsule, Rfl: 1   Insulin Disposable Pump (OMNIPOD DASH PODS, GEN 4,)  MISC, CHANGE EVERY 72 HOURS AS DIRECTED, Disp: 10 each, Rfl: 0   insulin lispro (HUMALOG) 100 UNIT/ML injection, Use pump to inject 50 units once daily, Disp: 50 mL, Rfl: 1   isosorbide mononitrate (IMDUR) 30 MG 24 hr tablet, TAKE 1 TABLET BY MOUTH ONCE DAILY (Patient taking differently: Take 30 mg by mouth daily.), Disp: 90 tablet, Rfl: 1   metFORMIN (GLUCOPHAGE) 500 MG tablet, Take 2 tablets by mouth twice daily, Disp: 120 tablet, Rfl: 3   nitroGLYCERIN (NITROSTAT) 0.4 MG SL tablet, PLACE 1 TABLETS UNDER THE TONGUE EVERY 5 MINS AS NEEDED FOR CHEST PAIN, Disp: 30 tablet, Rfl: 5   pantoprazole (PROTONIX) 40 MG tablet, Take 1 tablet (40 mg total) by mouth daily., Disp: 90 tablet, Rfl: 1   Semaglutide, 1 MG/DOSE, 4 MG/3ML SOPN, Inject 1 mg into the skin once a week., Disp: 3 mL, Rfl: 2  PAST MEDICAL HISTORY: Past Medical History:  Diagnosis Date   Abnormal cardiac CT  angiography    Acid reflux    Annual physical exam 04/08/2015   Arthritis    Atypical chest pain 06/13/2020   Blood transfusion without reported diagnosis    Body mass index (BMI) 35.0-35.9, adult 04/05/2019   Chronic fatigue 01/28/2015   Chronic headaches    on cymbalta   Chronic migraine w/o aura, not intractable, w/o stat migr 10/24/2018   Cirrhosis ([place])    Colon polyps    Complication of anesthesia    problems waking up from anesthesia   Coronary artery disease 01/23/2019   Depression    on cymbalta   Diabetes mellitus with neuropathy ([place])    Diabetes with neuropathy 04/25/2013   Diverticulitis 03/2013   Dyslipidemia 05/29/2019   Eczema    Elevated LFTs    Epidural lipomatosis 10/05/2018   Essential (primary) hypertension 04/05/2019   Essential hypertension 10/10/2019   Fatty liver    GERD (gastroesophageal reflux disease) 04/28/2011   H/O craniotomy 05/07/2015   Headache 04/28/2011   Hepatitis 10/2017   NASH cirrhosis   History of kidney stones    Hyperlipidemia    Hypersomnia with sleep apnea 01/28/2015   Hypertension    IDA (iron deficiency anemia) 01/24/2019   Idiopathic intracranial hypertension 01/14/2017   Insomnia 04/26/2013   Kidney stone    Liver cirrhosis secondary to NASH (nonalcoholic steatohepatitis) ([place]) 01/02/2016   Low back pain 04/05/2019   Lower back injury 08/14/2019   Morbid obesity ([place])    Neuromuscular disorder ([place])    neuropathy   Neuropathy    Nonalcoholic steatohepatitis 02/04/2118   Obstructive hydrocephalus ([place]) 01/28/2015   OSA -- dx ~ 2012, cpap intolerant 09/04/2014    dx ~ 2012, cpap intolerant    PCP NOTES >>> 04/08/2015   Post-op pain 03/19/2019   Post-traumatic hydrocephalus ([place])    s/p shunts x 2 (first got infected )   Presence of cerebrospinal fluid drainage device 07/28/2011   Psoriasis    sees Dr [name]   Psoriatic arthritis ([place])    REM behavioral disorder 01/14/2017   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   see's  Dr.Ortmann   Severe obesity (BMI >= 40) ([place]) 01/28/2015   SI (sacroiliac) joint dysfunction 08/14/2019   Sigmoid diverticulitis 04/25/2013   Sleep apnea    no CPAP      Spondylolisthesis, lumbar region 03/16/2019   Stomach ulcer    Testosterone deficiency 04/28/2011   VP (ventriculoperitoneal) shunt status 07/31/2020    PAST SURGICAL HISTORY: Past Surgical History:  Procedure Laterality Date   BACK SURGERY  1980   BRAIN SURGERY     VP shunts placed in 2007   CHOLECYSTECTOMY N/A 08/25/2017   Procedure: LAPAROSCOPIC CHOLECYSTECTOMY WITH INTRAOPERATIVE CHOLANGIOGRAM;  Surgeon: [name], MD;  Location: [facility];  Service: General;  Laterality: N/A;   COLONOSCOPY     CORONARY STENT INTERVENTION N/A 09/27/2020   Procedure: CORONARY STENT INTERVENTION;  Surgeon: Jettie Booze, MD;  Location: [place];  Service: Cardiovascular;  Laterality: N/A;   INTRAVASCULAR ULTRASOUND/IVUS N/A 09/27/2020   Procedure: Intravascular Ultrasound/IVUS;  Surgeon: Jettie Booze, MD;  Location: [place] [facility];  Service: Cardiovascular;  Laterality: N/A;   JOINT REPLACEMENT     total hip   LEFT HEART CATH N/A 09/27/2020   Procedure: Left Heart Cath;  Surgeon: Jettie Booze, MD;  Location: [facility];  Service: Cardiovascular;  Laterality: N/A;   LEFT HEART CATH AND CORONARY ANGIOGRAPHY N/A 09/24/2020   Procedure: LEFT HEART CATH AND CORONARY ANGIOGRAPHY;  Surgeon: Jettie Booze, MD;  Location: [facility];  Service: Cardiovascular;  Laterality: N/A;   LUMBAR FUSION  03/16/2019   SHOULDER SURGERY Left 2010   TOE SURGERY Left 2018   TONSILLECTOMY     as a child   TOTAL HIP ARTHROPLASTY Left 2011   UPPER GASTROINTESTINAL ENDOSCOPY  01/04/2020   VENTRICULOPERITONEAL SHUNT  2007   x2    FAMILY HISTORY: Family History  Problem Relation Age of Onset   Other Mother    Lung cancer Father        alive, former smoker    Heart disease Brother        MI age 40    Other Brother        Murdered   Down syndrome Son    Diabetes Neg Hx    Prostate cancer Neg Hx    Colon cancer Neg Hx    Stomach cancer Neg Hx    Pancreatic cancer Neg Hx    Liver disease Neg Hx     SOCIAL HISTORY:  Social History   Socioeconomic History   Marital status: Married    Spouse name: [name]   Number of children: 2   Years of education: Not on file   Highest education level: Not on file  Occupational History   Occupation: disabled   Tobacco Use   Smoking status: Never   Smokeless tobacco: Never  Vaping Use   Vaping Use: Never used  Substance and Sexual Activity   Alcohol use: No   Drug use: No   Sexual activity: Yes    Partners: Female  Other Topics Concern   Not on file  Social History Narrative   Household-- pt , wife, one adult son with Down's syndrome   younger son lives in [place] 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   Social Determinants of Health   Financial Resource Strain: Not on file  Food Insecurity: Not on file  Transportation Needs: Not on file  Physical Activity: Not on file  Stress: Not on file  Social Connections: Not on file  Intimate Partner Violence: Not on file     PHYSICAL EXAM  There were no vitals filed for this visit.   There is no height or weight on file to calculate BMI.   General: The patient is well-developed and well-nourished and in no acute distress,     Neck: He hasmild tenderness  at the occiput bilaterally.    Skin: Extremities are without significant edema.  Neurologic Exam  Mental status: The patient is alert and oriented x 3 at the time of the examination. The patient has apparent normal recent and remote memory, with an apparently normal attention span and concentration ability.   Speech is normal.  Cranial nerves: Extraocular movements are full.  Facial strength and sensation is normal. OK neck strength  Motor:  Muscle bulk is normal.   Tone is normal.  Strength is  5 / 5 in the arms and proximal legs and 4+/5 in the intrinsic foot muscles in both feet.   Coordination: Cerebellar testing shows good finger-nose-finger  Gait and station: Station is normal. Gait is arthritic. Tandem walk is wide. Romberg is negative.   Reflexes: Deep tendon reflexes are 1 and symmetric in the arms, 2 and symmetric at the knees and absent at the ankles.Marland Kitchen       DIAGNOSTIC DATA (LABS, IMAGING, TESTING) - I reviewed patient records, labs, notes, testing and imaging myself where available.  Lab Results  Component Value Date   WBC 5.5 12/25/2020   HGB 13.6 12/25/2020   HCT 40.5 12/25/2020   MCV 91.4 12/25/2020   PLT 137.0 (L) 12/25/2020      Component Value Date/Time   NA 137 12/31/2020 0936   NA 139 09/16/2020 1724   K 4.1 12/31/2020 0936   CL 103 12/31/2020 0936   CO2 24 12/31/2020 0936   GLUCOSE 177 (H) 12/31/2020 0936   BUN 23 12/31/2020 0936   BUN 21 09/16/2020 1724   CREATININE 1.55 (H) 12/31/2020 0936   CALCIUM 9.9 12/31/2020 0936   PROT 7.6 12/31/2020 0936   ALBUMIN 4.2 12/31/2020 0936   AST 21 12/31/2020 0936   ALT 23 12/31/2020 0936   ALKPHOS 75 12/31/2020 0936   BILITOT 1.4 (H) 12/31/2020 0936   GFRNONAA 52 (L) 09/28/2020 0254   GFRAA 66 08/08/2020 1442   Lab Results  Component Value Date   CHOL 145 12/31/2020   HDL 37.30 (L) 12/31/2020   LDLCALC 68 10/30/2020   LDLDIRECT 67.0 12/31/2020   TRIG 298.0 (H) 12/31/2020   CHOLHDL 4 12/31/2020   Lab Results  Component Value Date   HGBA1C 6.8 (H) 10/24/2020   Lab Results  Component Value Date   VITAMINB12 490 06/10/2018   Lab Results  Component Value Date   TSH 1.57 06/11/2020       ASSESSMENT AND PLAN  No diagnosis found.  1.  Botox 180 units as follows: Frontalis muscle (5 units x 4), corrugators and procerus (5 units x 3), temporalis (5 units x 8), occipitalis (5 units x 4), splenius capitis (12.5 units x 2), splenius cervicis (12.5 units x 2), C6-C7 paraspinal (5  units x 2), trapezius (12.5 units x 2) 2.   He is currently not a candidate for the inspire device for OSA due to the BMI above 32.  If he does lose some weight this could be considered as he is unable to tolerate CPAP.   3.   Continue clonazepam 0.5 mg for RBD and insomnia 4.   rtc 6 months, sooner if new or worsening issues.  Selma Mink A. Felecia Shelling, MD, [facility] 08/10/2480, 50:03 AM Certified in Neurology, Clinical Neurophysiology, Sleep Medicine, Pain Medicine and Neuroimaging  [facility] [address], [place], [place] [address] [phone]

## 2021-01-22 ENCOUNTER — Other Ambulatory Visit (HOSPITAL_BASED_OUTPATIENT_CLINIC_OR_DEPARTMENT_OTHER): Payer: Self-pay

## 2021-01-23 ENCOUNTER — Other Ambulatory Visit: Payer: Self-pay

## 2021-01-23 ENCOUNTER — Encounter: Payer: Self-pay | Admitting: Orthopaedic Surgery

## 2021-01-23 ENCOUNTER — Ambulatory Visit (INDEPENDENT_AMBULATORY_CARE_PROVIDER_SITE_OTHER): Payer: 59

## 2021-01-23 ENCOUNTER — Ambulatory Visit (INDEPENDENT_AMBULATORY_CARE_PROVIDER_SITE_OTHER): Payer: 59 | Admitting: Orthopaedic Surgery

## 2021-01-23 VITALS — Ht 71.0 in | Wt 245.0 lb

## 2021-01-23 DIAGNOSIS — I2510 Atherosclerotic heart disease of native coronary artery without angina pectoris: Secondary | ICD-10-CM

## 2021-01-23 DIAGNOSIS — M25551 Pain in right hip: Secondary | ICD-10-CM

## 2021-01-23 DIAGNOSIS — M79604 Pain in right leg: Secondary | ICD-10-CM

## 2021-01-23 MED ORDER — LIDOCAINE HCL 1 % IJ SOLN
2.0000 mL | INTRAMUSCULAR | Status: AC | PRN
  Administered 2021-01-23: 2 mL

## 2021-01-23 MED ORDER — METHYLPREDNISOLONE ACETATE 40 MG/ML IJ SUSP
80.0000 mg | INTRAMUSCULAR | Status: AC | PRN
  Administered 2021-01-23: 80 mg via INTRA_ARTICULAR

## 2021-01-23 MED ORDER — BUPIVACAINE HCL 0.5 % IJ SOLN
2.0000 mL | INTRAMUSCULAR | Status: AC | PRN
  Administered 2021-01-23: 2 mL via INTRA_ARTICULAR

## 2021-01-23 NOTE — Progress Notes (Signed)
Office Visit Note   Patient: James [name]           Date of Birth: Nov 18, 1955           MRN: [id number] Visit Date: 01/23/2021              Requested by: Colon Branch, [address] [place],  [place] [address] PCP: Colon Branch, MD   Assessment & Plan: Visit Diagnoses:  1. Pain in right leg     Plan: Mr. Gibbons has been experiencing pain in his low right buttock and lateral aspect of his right hip.  He has had a prior L4-5 L5-S1 fusion but does not appear to have any symptoms related to his back.  His straight leg raise was positive only for pain over the greater trochanter of the right hip.  No pain with internal and external rotation of his hip.  He has had a prior left total hip replacement but x-rays did not demonstrate any significant pathology about the right hip.  I am going to inject the greater trochanter right hip and monitor his response.  He could have an ischial bursitis.  He did take the wallet out of his back pocket as it may have been putting pressure on the sciatic nerve.  Obviously could have some issues with his back.  We will just see how he does after the cortisone injection  Follow-Up Instructions: No follow-ups on file.   Orders:  Orders Placed This Encounter  Procedures   XR Lumbar Spine 2-3 Views   XR Pelvis 1-2 Views   No orders of the defined types were placed in this encounter.     Procedures: Large Joint Inj: R greater trochanter on 01/23/2021 2:08 PM Indications: pain and diagnostic evaluation Details: 25 G 1.5 in needle  Arthrogram: No  Medications: 2 mL lidocaine 1 %; 2 mL bupivacaine 0.5 %; 80 mg methylPREDNISolone acetate 40 MG/ML Procedure, treatment alternatives, risks and benefits explained, specific risks discussed. Consent was given by the patient. Immediately prior to procedure a time out was called to verify the correct patient, procedure, equipment, support staff and site/side marked as required. Patient was prepped and  draped in the usual sterile fashion.      Clinical Data: No additional findings.   Subjective: Chief Complaint  Patient presents with   Right Hip - Pain  Patient presents today for right hip pain. He said that he has been having pain for years, but recently it has worsened. His pain is located in his right buttock and radiates down his leg. He said that his right leg is weak and gives out. No numbness or tingling. He had lower back surgery with Dr.Stern 2-3years ago. No lower back pain. He is taking Aleve as needed for pain.  HPI  Review of Systems   Objective: Vital Signs: Ht 5' 11"  (1.803 m)   Wt 245 lb (111.1 kg)   BMI 34.17 kg/m   Physical Exam Constitutional:      Appearance: He is well-developed.  Pulmonary:     Effort: Pulmonary effort is normal.  Skin:    General: Skin is warm and dry.  Neurological:     Mental Status: He is alert and oriented to person, place, and time.  Psychiatric:        Behavior: Behavior normal.    Ortho Exam straight leg raise on the right was positive for pain over the greater trochanter and ischial tuberosity.  No back pain.  No pain in the posterior thigh or leg only lateral thigh.  No percussible tenderness.  No groin pain with internal extra rotation or loss of motion.  Motor exam intact.  No pain in reference to his left total hip replacement  Specialty Comments:  No specialty comments available.  Imaging: No results found.   PMFS History: Patient Active Problem List   Diagnosis Date Noted   Kidney stone    Hypertension    Diabetes mellitus with neuropathy ([place])    CAD (coronary artery disease) 09/27/2020   Abnormal cardiac CT angiography    Stomach ulcer    Sleep apnea    Psoriasis    Post-traumatic hydrocephalus (HCC)    Neuropathy    Neuromuscular disorder ([place])    Morbid obesity ([place])    Hyperlipidemia    History of kidney stones    Fatty liver    Elevated LFTs    Eczema    Complication of anesthesia     Colon polyps    Cirrhosis (HCC)    Chronic headaches    Blood transfusion without reported diagnosis    Arthritis    Acid reflux    VP (ventriculoperitoneal) shunt status 07/31/2020   Atypical chest pain 06/13/2020   Essential hypertension 10/10/2019   Lower back injury 08/14/2019   SI (sacroiliac) joint dysfunction 08/14/2019   Dyslipidemia 05/29/2019   Body mass index (BMI) 35.0-35.9, adult 04/05/2019   Low back pain 04/05/2019   Essential (primary) hypertension 04/05/2019   Post-op pain 03/19/2019   Spondylolisthesis, lumbar region 03/16/2019   IDA (iron deficiency anemia) 01/24/2019   Coronary artery disease 01/23/2019   Chronic migraine w/o aura, not intractable, w/o stat migr 10/24/2018   Epidural lipomatosis 95/28/4132   Nonalcoholic steatohepatitis 44/06/270   Hepatitis 10/2017   Idiopathic intracranial hypertension 01/14/2017   REM behavioral disorder 01/14/2017   Liver cirrhosis secondary to NASH (nonalcoholic steatohepatitis) ([place]) 01/02/2016   H/O craniotomy 05/07/2015   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   Annual physical exam 04/08/2015   PCP NOTES >>> 04/08/2015   Hypersomnia with sleep apnea 01/28/2015   Severe obesity (BMI >= 40) ([place]) 01/28/2015   Obstructive hydrocephalus ([place]) 01/28/2015   Chronic fatigue 01/28/2015   Depression 09/04/2014   OSA -- dx ~ 2012, cpap intolerant 09/04/2014   Insomnia 04/26/2013   Sigmoid diverticulitis 04/25/2013   Diabetes with neuropathy 04/25/2013   Diverticulitis 03/2013   Presence of cerebrospinal fluid drainage device 07/28/2011   Psoriatic arthritis ([place]) 04/28/2011   Headache 04/28/2011   GERD (gastroesophageal reflux disease) 04/28/2011   Testosterone deficiency 04/28/2011   Past Medical History:  Diagnosis Date   Abnormal cardiac CT angiography    Acid reflux    Annual physical exam 04/08/2015   Arthritis    Atypical chest pain 06/13/2020   Blood transfusion without reported diagnosis     Body mass index (BMI) 35.0-35.9, adult 04/05/2019   Chronic fatigue 01/28/2015   Chronic headaches    on cymbalta   Chronic migraine w/o aura, not intractable, w/o stat migr 10/24/2018   Cirrhosis ([place])    Colon polyps    Complication of anesthesia    problems waking up from anesthesia   Coronary artery disease 01/23/2019   Depression    on cymbalta   Diabetes mellitus with neuropathy ([place])    Diabetes with neuropathy 04/25/2013   Diverticulitis 03/2013   Dyslipidemia 05/29/2019   Eczema    Elevated LFTs    Epidural  lipomatosis 10/05/2018   Essential (primary) hypertension 04/05/2019   Essential hypertension 10/10/2019   Fatty liver    GERD (gastroesophageal reflux disease) 04/28/2011   H/O craniotomy 05/07/2015   Headache 04/28/2011   Hepatitis 10/2017   NASH cirrhosis   History of kidney stones    Hyperlipidemia    Hypersomnia with sleep apnea 01/28/2015   Hypertension    IDA (iron deficiency anemia) 01/24/2019   Idiopathic intracranial hypertension 01/14/2017   Insomnia 04/26/2013   Kidney stone    Liver cirrhosis secondary to NASH (nonalcoholic steatohepatitis) ([place]) 01/02/2016   Low back pain 04/05/2019   Lower back injury 08/14/2019   Morbid obesity ([place])    Neuromuscular disorder ([place])    neuropathy   Neuropathy    Nonalcoholic steatohepatitis 01/30/3824   Obstructive hydrocephalus ([place]) 01/28/2015   OSA -- dx ~ 2012, cpap intolerant 09/04/2014    dx ~ 2012, cpap intolerant    PCP NOTES >>> 04/08/2015   Post-op pain 03/19/2019   Post-traumatic hydrocephalus ([place])    s/p shunts x 2 (first got infected )   Presence of cerebrospinal fluid drainage device 07/28/2011   Psoriasis    sees Dr [name]   Psoriatic arthritis ([place])    REM behavioral disorder 01/14/2017   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   see's Dr.Ortmann   Severe obesity (BMI >= 40) (HCC) 01/28/2015   SI (sacroiliac) joint dysfunction 08/14/2019   Sigmoid diverticulitis 04/25/2013   Sleep apnea    no  CPAP      Spondylolisthesis, lumbar region 03/16/2019   Stomach ulcer    Testosterone deficiency 04/28/2011   VP (ventriculoperitoneal) shunt status 07/31/2020    Family History  Problem Relation Age of Onset   Other Mother    Lung cancer Father        alive, former smoker    Heart disease Brother        MI age 3   Other Brother        Murdered   Down syndrome Son    Diabetes Neg Hx    Prostate cancer Neg Hx    Colon cancer Neg Hx    Stomach cancer Neg Hx    Pancreatic cancer Neg Hx    Liver disease Neg Hx     Past Surgical History:  Procedure Laterality Date   [place]     VP shunts placed in 2007   CHOLECYSTECTOMY N/A 08/25/2017   Procedure: LAPAROSCOPIC CHOLECYSTECTOMY WITH INTRAOPERATIVE CHOLANGIOGRAM;  Surgeon: [name], MD;  Location: [facility];  Service: General;  Laterality: N/A;   COLONOSCOPY     CORONARY STENT INTERVENTION N/A 09/27/2020   Procedure: CORONARY STENT INTERVENTION;  Surgeon: Jettie Booze, MD;  Location: [facility];  Service: Cardiovascular;  Laterality: N/A;   INTRAVASCULAR ULTRASOUND/IVUS N/A 09/27/2020   Procedure: Intravascular Ultrasound/IVUS;  Surgeon: Jettie Booze, MD;  Location: [facility];  Service: Cardiovascular;  Laterality: N/A;   JOINT REPLACEMENT     total hip   LEFT HEART CATH N/A 09/27/2020   Procedure: Left Heart Cath;  Surgeon: Jettie Booze, MD;  Location: [facility];  Service: Cardiovascular;  Laterality: N/A;   LEFT HEART CATH AND CORONARY ANGIOGRAPHY N/A 09/24/2020   Procedure: LEFT HEART CATH AND CORONARY ANGIOGRAPHY;  Surgeon: Jettie Booze, MD;  Location: [facility];  Service: Cardiovascular;  Laterality: N/A;   LUMBAR FUSION  03/16/2019  SHOULDER SURGERY Left 2010   TOE SURGERY Left 2018   TONSILLECTOMY     as a child   TOTAL HIP ARTHROPLASTY Left 2011   UPPER GASTROINTESTINAL ENDOSCOPY  01/04/2020   VENTRICULOPERITONEAL SHUNT  2007   x2    Social History   Occupational History   Occupation: disabled   Tobacco Use   Smoking status: Never   Smokeless tobacco: Never  Vaping Use   Vaping Use: Never used  Substance and Sexual Activity   Alcohol use: No   Drug use: No   Sexual activity: Yes    Partners: Female

## 2021-01-30 ENCOUNTER — Other Ambulatory Visit (HOSPITAL_BASED_OUTPATIENT_CLINIC_OR_DEPARTMENT_OTHER): Payer: Self-pay

## 2021-01-30 ENCOUNTER — Other Ambulatory Visit: Payer: Self-pay | Admitting: Internal Medicine

## 2021-01-30 MED ORDER — CARVEDILOL 12.5 MG PO TABS
ORAL_TABLET | Freq: Two times a day (BID) | ORAL | 1 refills | Status: DC
  Filled 2021-01-30: qty 180, 90d supply, fill #0
  Filled 2021-05-06: qty 180, 90d supply, fill #1

## 2021-02-06 DIAGNOSIS — N5201 Erectile dysfunction due to arterial insufficiency: Secondary | ICD-10-CM | POA: Diagnosis not present

## 2021-02-07 ENCOUNTER — Other Ambulatory Visit (HOSPITAL_COMMUNITY): Payer: Self-pay

## 2021-02-07 MED FILL — Adalimumab Auto-injector Kit 40 MG/0.8ML: SUBCUTANEOUS | 28 days supply | Qty: 2 | Fill #4 | Status: AC

## 2021-02-10 ENCOUNTER — Other Ambulatory Visit (HOSPITAL_COMMUNITY): Payer: Self-pay

## 2021-02-11 ENCOUNTER — Telehealth: Payer: Self-pay | Admitting: Endocrinology

## 2021-02-11 NOTE — Telephone Encounter (Signed)
Patient is almost out of the free style sensors. Pt voiced that Dr.Kumar told patient to call back once he was almost finished with this Continuous Blood Gluc Sensor (FREESTYLE LIBRE 2 SENSOR) MISC   Pt is requesting omniopod sensors  [place]

## 2021-02-14 ENCOUNTER — Other Ambulatory Visit: Payer: Self-pay | Admitting: Endocrinology

## 2021-02-14 ENCOUNTER — Other Ambulatory Visit (HOSPITAL_BASED_OUTPATIENT_CLINIC_OR_DEPARTMENT_OTHER): Payer: Self-pay

## 2021-02-14 MED ORDER — FENOFIBRATE MICRONIZED 134 MG PO CAPS
ORAL_CAPSULE | ORAL | 3 refills | Status: DC
  Filled 2021-02-14: qty 15, 35d supply, fill #0
  Filled 2021-03-21: qty 15, 35d supply, fill #1
  Filled 2021-06-16: qty 15, 35d supply, fill #2
  Filled 2021-08-14: qty 15, 35d supply, fill #3

## 2021-02-19 ENCOUNTER — Other Ambulatory Visit (HOSPITAL_BASED_OUTPATIENT_CLINIC_OR_DEPARTMENT_OTHER): Payer: Self-pay

## 2021-02-19 ENCOUNTER — Other Ambulatory Visit: Payer: Self-pay | Admitting: Endocrinology

## 2021-02-19 MED ORDER — OMNIPOD DASH PODS (GEN 4) MISC
1 refills | Status: DC
  Filled 2021-02-19: qty 10, 30d supply, fill #0

## 2021-02-20 ENCOUNTER — Other Ambulatory Visit (HOSPITAL_BASED_OUTPATIENT_CLINIC_OR_DEPARTMENT_OTHER): Payer: Self-pay

## 2021-02-21 ENCOUNTER — Other Ambulatory Visit (HOSPITAL_BASED_OUTPATIENT_CLINIC_OR_DEPARTMENT_OTHER): Payer: Self-pay

## 2021-02-28 ENCOUNTER — Other Ambulatory Visit: Payer: 59

## 2021-03-04 ENCOUNTER — Other Ambulatory Visit: Payer: Self-pay

## 2021-03-04 ENCOUNTER — Other Ambulatory Visit (INDEPENDENT_AMBULATORY_CARE_PROVIDER_SITE_OTHER): Payer: 59

## 2021-03-04 DIAGNOSIS — E1165 Type 2 diabetes mellitus with hyperglycemia: Secondary | ICD-10-CM | POA: Diagnosis not present

## 2021-03-04 DIAGNOSIS — E782 Mixed hyperlipidemia: Secondary | ICD-10-CM | POA: Diagnosis not present

## 2021-03-04 DIAGNOSIS — Z794 Long term (current) use of insulin: Secondary | ICD-10-CM | POA: Diagnosis not present

## 2021-03-04 LAB — COMPREHENSIVE METABOLIC PANEL
ALT: 25 U/L (ref 0–53)
AST: 25 U/L (ref 0–37)
Albumin: 3.9 g/dL (ref 3.5–5.2)
Alkaline Phosphatase: 67 U/L (ref 39–117)
BUN: 18 mg/dL (ref 6–23)
CO2: 24 mEq/L (ref 19–32)
Calcium: 9.3 mg/dL (ref 8.4–10.5)
Chloride: 106 mEq/L (ref 96–112)
Creatinine, Ser: 1.39 mg/dL (ref 0.40–1.50)
GFR: 54.14 mL/min — ABNORMAL LOW (ref >60.00)
Glucose, Bld: 159 mg/dL — ABNORMAL HIGH (ref 70–99)
Potassium: 4.3 mEq/L (ref 3.5–5.1)
Sodium: 138 mEq/L (ref 135–145)
Total Bilirubin: 1.2 mg/dL (ref 0.2–1.2)
Total Protein: 7.4 g/dL (ref 6.0–8.3)

## 2021-03-04 LAB — LIPID PANEL
Cholesterol: 139 mg/dL (ref 0–200)
HDL: 39.9 mg/dL (ref >39.00)
LDL Cholesterol: 69 mg/dL (ref 0–99)
NonHDL: 99
Total CHOL/HDL Ratio: 3
Triglycerides: 151 mg/dL — ABNORMAL HIGH (ref 0.0–149.0)
VLDL: 30.2 mg/dL (ref 0.0–40.0)

## 2021-03-04 LAB — HEMOGLOBIN A1C: Hgb A1c MFr Bld: 6.8 % — ABNORMAL HIGH (ref 4.6–6.5)

## 2021-03-04 LAB — MICROALBUMIN / CREATININE URINE RATIO
Creatinine,U: 279.4 mg/dL
Microalb Creat Ratio: 2 mg/g (ref 0.0–30.0)
Microalb, Ur: 5.5 mg/dL — ABNORMAL HIGH (ref 0.0–1.9)

## 2021-03-05 ENCOUNTER — Ambulatory Visit: Payer: 59 | Admitting: Endocrinology

## 2021-03-06 ENCOUNTER — Telehealth: Payer: 59 | Admitting: Physician Assistant

## 2021-03-06 ENCOUNTER — Encounter: Payer: Self-pay | Admitting: Physician Assistant

## 2021-03-06 DIAGNOSIS — U071 COVID-19: Secondary | ICD-10-CM

## 2021-03-06 DIAGNOSIS — R059 Cough, unspecified: Secondary | ICD-10-CM | POA: Diagnosis not present

## 2021-03-06 DIAGNOSIS — I2510 Atherosclerotic heart disease of native coronary artery without angina pectoris: Secondary | ICD-10-CM

## 2021-03-06 MED ORDER — MOLNUPIRAVIR EUA 200MG CAPSULE: 4.0000 | ORAL_CAPSULE | Freq: Two times a day (BID) | ORAL | 0 refills | Status: DC

## 2021-03-06 MED ORDER — MOLNUPIRAVIR EUA 200MG CAPSULE
4.0000 | ORAL_CAPSULE | Freq: Two times a day (BID) | ORAL | 0 refills | Status: AC
Start: 2021-03-06 — End: 2021-03-11

## 2021-03-06 MED ORDER — BENZONATATE 100 MG PO CAPS: 100.0000 mg | ORAL_CAPSULE | Freq: Three times a day (TID) | ORAL | 0 refills | Status: DC | PRN

## 2021-03-06 NOTE — Addendum Note (Signed)
Addended by: Perlie Mayo on: 03/06/2021 01:04 PM   Modules accepted: Orders

## 2021-03-06 NOTE — Progress Notes (Addendum)
[name], plain are scheduled for a virtual visit with your provider today.    Just as we do with appointments in the office, we must obtain your consent to participate.  Your consent will be active for this visit and any virtual visit you may have with one of our providers in the next 365 days.    If you have a MyChart account, I can also send a copy of this consent to you electronically.  All virtual visits are billed to your insurance company just like a traditional visit in the office.  As this is a virtual visit, video technology does not allow for your provider to perform a traditional examination.  This may limit your provider's ability to fully assess your condition.  If your provider identifies any concerns that need to be evaluated in person or the need to arrange testing such as labs, EKG, etc, we will make arrangements to do so.    Although advances in technology are sophisticated, we cannot ensure that it will always work on either your end or our end.  If the connection with a video visit is poor, we may have to switch to a telephone visit.  With either a video or telephone visit, we are not always able to ensure that we have a secure connection.   I need to obtain your verbal consent now.   Are you willing to proceed with your visit today?   [name] has provided verbal consent on 03/06/2021 for a virtual visit (video or telephone).   Perlie Mayo, NP 03/06/2021  12:47 PM   Date:  03/06/2021   ID:  James [name], DOB 1956-05-09, MRN [id number]  Patient Location: Home Provider Location: Home Office   Participants: Patient and Provider for Visit and Wrap up  Method of visit: Video  Location of Patient: Home Location of Provider: Home Office Consent was obtain for visit over the video. Services rendered by provider: Visit was performed via video  A video enabled telemedicine application was used and I verified that I am speaking with the correct person using two  identifiers.  PCP:  Colon Branch, MD   Chief Complaint:  COVID +  History of Present Illness:    [name] is a 65 y.o. male with history as stated below. Presents video telehealth for an acute care visit due to [place] + via HT today. Onset of symptoms was Wednesday 03/05/21 and symptoms have been persistent  and include: coughing, headache, tired.    Denies having fevers, chills, shortness of breath, cough, chest pain, ear pain,  sore throat or exposure to covid or other sick contacts. Modifying factors include: nothing No other aggravating or relieving factors.  No other c/o.  Past Medical, Surgical, Social History, Allergies, and Medications have been Reviewed.  Past Medical History:  Diagnosis Date   Abnormal cardiac CT angiography    Acid reflux    Annual physical exam 04/08/2015   Arthritis    Atypical chest pain 06/13/2020   Blood transfusion without reported diagnosis    Body mass index (BMI) 35.0-35.9, adult 04/05/2019   Chronic fatigue 01/28/2015   Chronic headaches    on cymbalta   Chronic migraine w/o aura, not intractable, w/o stat migr 10/24/2018   Cirrhosis ([place])    Colon polyps    Complication of anesthesia    problems waking up from anesthesia   Coronary artery disease 01/23/2019   Depression    on cymbalta   Diabetes  mellitus with neuropathy ([place])    Diabetes with neuropathy 04/25/2013   Diverticulitis 03/2013   Dyslipidemia 05/29/2019   Eczema    Elevated LFTs    Epidural lipomatosis 10/05/2018   Essential (primary) hypertension 04/05/2019   Essential hypertension 10/10/2019   Fatty liver    GERD (gastroesophageal reflux disease) 04/28/2011   H/O craniotomy 05/07/2015   Headache 04/28/2011   Hepatitis 10/2017   NASH cirrhosis   History of kidney stones    Hyperlipidemia    Hypersomnia with sleep apnea 01/28/2015   Hypertension    IDA (iron deficiency anemia) 01/24/2019   Idiopathic intracranial hypertension 01/14/2017   Insomnia 04/26/2013   Kidney  stone    Liver cirrhosis secondary to NASH (nonalcoholic steatohepatitis) ([place]) 01/02/2016   Low back pain 04/05/2019   Lower back injury 08/14/2019   Morbid obesity ([place])    Neuromuscular disorder ([place])    neuropathy   Neuropathy    Nonalcoholic steatohepatitis 01/01/2830   Obstructive hydrocephalus ([place]) 01/28/2015   OSA -- dx ~ 2012, cpap intolerant 09/04/2014    dx ~ 2012, cpap intolerant    PCP NOTES >>> 04/08/2015   Post-op pain 03/19/2019   Post-traumatic hydrocephalus ([place])    s/p shunts x 2 (first got infected )   Presence of cerebrospinal fluid drainage device 07/28/2011   Psoriasis    sees Dr [name]   Psoriatic arthritis ([place])    REM behavioral disorder 01/14/2017   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   see's Dr.Ortmann   Severe obesity (BMI >= 40) ([place]) 01/28/2015   SI (sacroiliac) joint dysfunction 08/14/2019   Sigmoid diverticulitis 04/25/2013   Sleep apnea    no CPAP      Spondylolisthesis, lumbar region 03/16/2019   Stomach ulcer    Testosterone deficiency 04/28/2011   VP (ventriculoperitoneal) shunt status 07/31/2020      Allergies:   Bee venom, Hydrocodone bit-homatrop mbr, Toradol [ketorolac tromethamine], Morphine and related, and Sulfa drugs cross reactors   ROS See HPI for history of present illness.  Physical Exam Constitutional:      Appearance: Normal appearance.  HENT:     Head: Normocephalic and atraumatic.     Right Ear: External ear normal.     Left Ear: External ear normal.     Nose: Nose normal.  Eyes:     Conjunctiva/sclera: Conjunctivae normal.  Pulmonary:     Effort: Pulmonary effort is normal.     Comments: No shortness of breath noted, cough present  Neurological:     Mental Status: He is alert and oriented to person, place, and time.              A&P  1. COVID Advised to stay well-hydrated and get plenty of rest. Reviewed OTC and placed on AVS as well  Antiviral given medical Hx   - molnupiravir EUA 200 mg CAPS;  Take 4 capsules (800 mg total) by mouth 2 (two) times daily for 5 days.  Dispense: 40 capsule; Refill: 0 - benzonatate (TESSALON PERLES) 100 MG capsule; Take 1 capsule (100 mg total) by mouth 3 (three) times daily as needed for cough.  Dispense: 20 capsule; Refill: 0  2. Cough Mild cough- perles provided - benzonatate (TESSALON PERLES) 100 MG capsule; Take 1 capsule (100 mg total) by mouth 3 (three) times daily as needed for cough.  Dispense: 20 capsule; Refill: 0   Time:   Today, I have spent 10 minutes with the patient with telehealth technology discussing the  above problems, reviewing the chart, previous notes, medications and orders.   Medication Changes: No orders of the defined types were placed in this encounter.    Disposition:  Follow up PCP  Signed, Perlie Mayo, NP  03/06/2021 12:47 PM

## 2021-03-06 NOTE — Patient Instructions (Signed)
Please keep well-hydrated and get plenty of rest. Start a saline nasal rinse to flush out your nasal passages. You can use plain Mucinex to help thin congestion. If you have a humidifier, running in the bedroom at night. I want you to start OTC vitamin D3 1000 units daily, vitamin C 1000 mg daily, and a zinc supplement. Please take prescribed medications as directed.  You were to quarantine for 5 days from onset of your symptoms.  After day 5, if you have had no fever and you are feeling better, you can end quarantine but need to mask for an additional 5 days. After day 5 if you have a fever or are having significant symptoms, please quarantine for full 10 days.  If you note any worsening of symptoms, any significant shortness of breath or any chest pain, please seek ER evaluation ASAP.  Please do not delay care!  COVID-19: What to Do if You Are Sick If you test positive and are an older adult or someone who is at high risk of getting very sick from COVID-19, treatment may be available. Contact a healthcare provider right away after a positive test to determine if you are eligible, even if your symptoms are mild right now. You can also visit a Test to Treat location and, if eligible, receive a prescription from a provider. Don't delay: Treatment must be started within the first few days to be effective. If you have a fever, cough, or other symptoms, you might have COVID-19. Most people have mild illness and are able to recover at home. If you are sick: Keep track of your symptoms. If you have an emergency warning sign (including trouble breathing), call 911. Steps to help prevent the spread of COVID-19 if you are sick If you are sick with COVID-19 or think you might have COVID-19, follow the steps below to care for yourself and to help protect other people in your home and community. Stay home except to get medical care Stay home. Most people with COVID-19 have mild illness and can recover at  home without medical care. Do not leave your home, except to get medical care. Do not visit public areas and do not go to places where you are unable to wear a mask. Take care of yourself. Get rest and stay hydrated. Take over-the-counter medicines, such as acetaminophen, to help you feel better. Stay in touch with your doctor. Call before you get medical care. Be sure to get care if you have trouble breathing, or have any other emergency warning signs, or if you think it is an emergency. Avoid public transportation, ride-sharing, or taxis if possible. Get tested If you have symptoms of COVID-19, get tested. While waiting for test results, stay away from others, including staying apart from those living in your household. Get tested as soon as possible after your symptoms start. Treatments may be available for people with COVID-19 who are at risk for becoming very sick. Don't delay: Treatment must be started early to be effective--some treatments must begin within 5 days of your first symptoms. Contact your healthcare provider right away if your test result is positive to determine if you are eligible. Self-tests are one of several options for testing for the virus that causes COVID-19 and may be more convenient than laboratory-based tests and point-of-care tests. Ask your healthcare provider or your local health department if you need help interpreting your test results. You can visit your state, tribal, local, and territorial health department's website to look  for the latest local information on testing sites. Separate yourself from other people As much as possible, stay in a specific room and away from other people and pets in your home. If possible, you should use a separate bathroom. If you need to be around other people or animals in or outside of the home, wear a well-fitting mask. Tell your close contacts that they may have been exposed to COVID-19. An infected person can spread COVID-19 starting  48 hours (or 2 days) before the person has any symptoms or tests positive. By letting your close contacts know they may have been exposed to COVID-19, you are helping to protect everyone. See COVID-19 and Animals if you have questions about pets. If you are diagnosed with COVID-19, someone from the health department may call you. Answer the call to slow the spread. Monitor your symptoms Symptoms of COVID-19 include fever, cough, or other symptoms. Follow care instructions from your healthcare provider and local health department. Your local health authorities may give instructions on checking your symptoms and reporting information. When to seek emergency medical attention Look for emergency warning signs* for COVID-19. If someone is showing any of these signs, seek emergency medical care immediately: Trouble breathing Persistent pain or pressure in the chest New confusion Inability to wake or stay awake Pale, gray, or blue-colored skin, lips, or nail beds, depending on skin tone *This list is not all possible symptoms. Please call your medical provider for any other symptoms that are severe or concerning to you. Call 911 or call ahead to your local emergency facility: Notify the operator that you are seeking care for someone who has or may have COVID-19. Call ahead before visiting your doctor Call ahead. Many medical visits for routine care are being postponed or done by phone or telemedicine. If you have a medical appointment that cannot be postponed, call your doctor's office, and tell them you have or may have COVID-19. This will help the office protect themselves and other patients. If you are sick, wear a well-fitting mask You should wear a mask if you must be around other people or animals, including pets (even at home). Wear a mask with the best fit, protection, and comfort for you. You don't need to wear the mask if you are alone. If you can't put on a mask (because of trouble  breathing, for example), cover your coughs and sneezes in some other way. Try to stay at least 6 feet away from other people. This will help protect the people around you. Masks should not be placed on young children under age 83 years, anyone who has trouble breathing, or anyone who is not able to remove the mask without help. Cover your coughs and sneezes Cover your mouth and nose with a tissue when you cough or sneeze. Throw away used tissues in a lined trash can. Immediately wash your hands with soap and water for at least 20 seconds. If soap and water are not available, clean your hands with an alcohol-based hand sanitizer that contains at least 60% alcohol. Clean your hands often Wash your hands often with soap and water for at least 20 seconds. This is especially important after blowing your nose, coughing, or sneezing; going to the bathroom; and before eating or preparing food. Use hand sanitizer if soap and water are not available. Use an alcohol-based hand sanitizer with at least 60% alcohol, covering all surfaces of your hands and rubbing them together until they feel dry. Soap and water are  the best option, especially if hands are visibly dirty. Avoid touching your eyes, nose, and mouth with unwashed hands. Handwashing Tips Avoid sharing personal household items Do not share dishes, drinking glasses, cups, eating utensils, towels, or bedding with other people in your home. Wash these items thoroughly after using them with soap and water or put in the dishwasher. Clean surfaces in your home regularly Clean and disinfect high-touch surfaces (for example, doorknobs, tables, handles, light switches, and countertops) in your "sick room" and bathroom. In shared spaces, you should clean and disinfect surfaces and items after each use by the person who is ill. If you are sick and cannot clean, a caregiver or other person should only clean and disinfect the area around you (such as your bedroom  and bathroom) on an as needed basis. Your caregiver/other person should wait as long as possible (at least several hours) and wear a mask before entering, cleaning, and disinfecting shared spaces that you use. Clean and disinfect areas that may have blood, stool, or body fluids on them. Use household cleaners and disinfectants. Clean visible dirty surfaces with household cleaners containing soap or detergent. Then, use a household disinfectant. Use a product from [employer] List N: Disinfectants for Coronavirus (TIRWE-31). Be sure to follow the instructions on the label to ensure safe and effective use of the product. Many products recommend keeping the surface wet with a disinfectant for a certain period of time (look at "contact time" on the product label). You may also need to wear personal protective equipment, such as gloves, depending on the directions on the product label. Immediately after disinfecting, wash your hands with soap and water for 20 seconds. For completed guidance on cleaning and disinfecting your home, visit Complete Disinfection Guidance. Take steps to improve ventilation at home Improve ventilation (air flow) at home to help prevent from spreading COVID-19 to other people in your household. Clear out COVID-19 virus particles in the air by opening windows, using air filters, and turning on fans in your home. Use this interactive tool to learn how to improve air flow in your home. When you can be around others after being sick with COVID-19 Deciding when you can be around others is different for different situations. Find out when you can safely end home isolation. For any additional questions about your care, contact your healthcare provider or state or local health department. 09/17/2020 Content source: [facility] ([facility]), Division of Viral Diseases This information is not intended to replace advice given to you by your health care  provider. Make sure you discuss any questions you have with your health care provider. Document Revised: 10/31/2020 Document Reviewed: 10/31/2020 [employer] Patient Education  [place].

## 2021-03-07 ENCOUNTER — Other Ambulatory Visit: Payer: Self-pay

## 2021-03-07 ENCOUNTER — Other Ambulatory Visit (HOSPITAL_COMMUNITY): Payer: Self-pay

## 2021-03-08 ENCOUNTER — Other Ambulatory Visit (HOSPITAL_COMMUNITY): Payer: Self-pay

## 2021-03-10 ENCOUNTER — Other Ambulatory Visit: Payer: Self-pay | Admitting: Pharmacist

## 2021-03-10 ENCOUNTER — Other Ambulatory Visit (HOSPITAL_COMMUNITY): Payer: Self-pay

## 2021-03-10 MED ORDER — HUMIRA PEN 40 MG/0.8ML ~~LOC~~ PNKT
PEN_INJECTOR | SUBCUTANEOUS | 0 refills | Status: DC
  Filled 2021-03-10: qty 2, 28d supply, fill #0

## 2021-03-10 MED ORDER — HUMIRA PEN 40 MG/0.8ML ~~LOC~~ PNKT: PEN_INJECTOR | SUBCUTANEOUS | 0 refills | Status: DC

## 2021-03-11 ENCOUNTER — Ambulatory Visit: Payer: 59 | Admitting: Cardiology

## 2021-03-11 ENCOUNTER — Ambulatory Visit: Payer: 59 | Admitting: Endocrinology

## 2021-03-11 ENCOUNTER — Other Ambulatory Visit (HOSPITAL_COMMUNITY): Payer: Self-pay

## 2021-03-14 ENCOUNTER — Other Ambulatory Visit (HOSPITAL_BASED_OUTPATIENT_CLINIC_OR_DEPARTMENT_OTHER): Payer: Self-pay

## 2021-03-18 ENCOUNTER — Ambulatory Visit (INDEPENDENT_AMBULATORY_CARE_PROVIDER_SITE_OTHER): Payer: 59

## 2021-03-18 VITALS — Ht 71.0 in | Wt 240.0 lb

## 2021-03-18 DIAGNOSIS — Z0000 Encounter for general adult medical examination without abnormal findings: Secondary | ICD-10-CM

## 2021-03-18 NOTE — Progress Notes (Signed)
Subjective:   James [name] is a 65 y.o. male who presents for an Initial Medicare Annual Wellness Visit.  I connected with Tomy today by telephone and verified that I am speaking with the correct person using two identifiers. Location patient: home Location provider: work Persons participating in the virtual visit: patient, Marine scientist.    I discussed the limitations, risks, security and privacy concerns of performing an evaluation and management service by telephone and the availability of in person appointments. I also discussed with the patient that there may be a patient responsible charge related to this service. The patient expressed understanding and verbally consented to this telephonic visit.    Interactive audio and video telecommunications were attempted between this provider and patient, however failed, due to patient having technical difficulties OR patient did not have access to video capability.  We continued and completed visit with audio only.  Some vital signs may be absent or patient reported.   Time Spent with patient on telephone encounter: 20 minutes   Review of Systems     Cardiac Risk Factors include: advanced age (>24mn, >>85women);male gender;diabetes mellitus;dyslipidemia;hypertension;sedentary lifestyle;obesity (BMI >30kg/m2)     Objective:    Today's Vitals   03/18/21 1059  Weight: 240 lb (108.9 kg)  Height: 5' 11"  (1.803 m)  PainSc: 5    Body mass index is 33.47 kg/m.  Advanced Directives 03/18/2021 09/27/2020 09/27/2020 09/24/2020 03/19/2019 03/08/2019 01/21/2019  Does Patient Have a Medical Advance Directive? Yes Yes Yes Yes Yes Yes Yes  Type of AParamed[place]of [place]Living will [place] will [place] Lake[place] will Healthcare Power of Attorney Living will;Healthcare Power of AMc[place]Living will -  Does patient want to make changes to medical advance  directive? - No - Patient declined - No - Patient declined - No - Patient declined -  Copy of [place] Chart? No - copy requested No - copy requested - No - copy requested No - copy requested No - copy requested -  Would patient like information on creating a medical advance directive? - - - - - - -  Pre-existing out of facility DNR order (yellow form or pink MOST form) - - - - - - -    Current Medications (verified) Outpatient Encounter Medications as of 03/18/2021  Medication Sig   Adalimumab (HUMIRA PEN) 40 MG/0.8ML PNKT INJECT 40 MG (0.8 ML) UNDER THE SKIN EVERY OTHER WEEK.   amLODipine (NORVASC) 5 MG tablet Take 1 tablet (5 mg total) by mouth daily.   aspirin EC 81 MG EC tablet Take 1 tablet (81 mg total) by mouth daily. Swallow whole.   atorvastatin (LIPITOR) 80 MG tablet Take 1 tablet (80 mg total) by mouth at bedtime.   carvedilol (COREG) 12.5 MG tablet TAKE 1 TABLET (12.5 MG TOTAL) BY MOUTH 2 (TWO) TIMES DAILY WITH A MEAL.   clonazePAM (KLONOPIN) 0.5 MG tablet TAKE 1 TABLET BY MOUTH EVERY NIGHT AT BEDTIME   clopidogrel (PLAVIX) 75 MG tablet TAKE 1 TABLET (75 MG TOTAL) BY MOUTH DAILY.   Continuous Blood Gluc Sensor (FREESTYLE LIBRE 2 SENSOR) MISC Use as directed every 14 days   DULoxetine (CYMBALTA) 60 MG capsule TAKE 2 CAPSULES (120 MG TOTAL) BY MOUTH DAILY. (Patient taking differently: Take 60 mg by mouth 2 (two) times daily.)   EPINEPHrine (EPIPEN 2-PAK) 0.3 mg/0.3 mL IJ SOAJ injection Inject 0.3 mLs (0.3 mg total) into the muscle once as  needed for up to 1 dose for anaphylaxis.   fenofibrate micronized (LOFIBRA) 134 MG capsule TAKE 1 CAPSULE BY MOUTH THREE TIMES WEEKLY   gabapentin (NEURONTIN) 600 MG tablet TAKE 1 TABLET (600 MG TOTAL) BY MOUTH 2 (TWO) TIMES DAILY. (Patient taking differently: Take 600 mg by mouth 2 (two) times daily.)   icosapent Ethyl (VASCEPA) 1 g capsule Take 2 capsules (2 g total) by mouth 2 (two) times daily.   Insulin Disposable Pump  (OMNIPOD DASH PODS, GEN 4,) MISC CHANGE EVERY 72 HOURS AS DIRECTED   insulin lispro (HUMALOG) 100 UNIT/ML injection Use pump to inject 50 units once daily   isosorbide mononitrate (IMDUR) 30 MG 24 hr tablet TAKE 1 TABLET BY MOUTH ONCE DAILY (Patient taking differently: Take 30 mg by mouth daily.)   metFORMIN (GLUCOPHAGE) 500 MG tablet Take 2 tablets by mouth twice daily   nitroGLYCERIN (NITROSTAT) 0.4 MG SL tablet PLACE 1 TABLETS UNDER THE TONGUE EVERY 5 MINS AS NEEDED FOR CHEST PAIN   pantoprazole (PROTONIX) 40 MG tablet Take 1 tablet (40 mg total) by mouth daily.   Semaglutide, 1 MG/DOSE, 4 MG/3ML SOPN Inject 1 mg into the skin once a week.   benzonatate (TESSALON PERLES) 100 MG capsule Take 1 capsule (100 mg total) by mouth 3 (three) times daily as needed for cough. (Patient not taking: Reported on 03/18/2021)   clopidogrel (PLAVIX) 75 MG tablet Take 1 tablet (75 mg total) by mouth daily.   glimepiride (AMARYL) 4 MG tablet TAKE 1 TABLET BY MOUTH ONCE DAILY BEFORE BREAKFAST (Patient taking differently: Take 4 mg by mouth daily with breakfast.)   No facility-administered encounter medications on file as of 03/18/2021.    Allergies (verified) Bee venom, Hydrocodone bit-homatrop mbr, Toradol [ketorolac tromethamine], Morphine and related, and Sulfa drugs cross reactors   History: Past Medical History:  Diagnosis Date   Abnormal cardiac CT angiography    Acid reflux    Annual physical exam 04/08/2015   Arthritis    Atypical chest pain 06/13/2020   Blood transfusion without reported diagnosis    Body mass index (BMI) 35.0-35.9, adult 04/05/2019   Chronic fatigue 01/28/2015   Chronic headaches    on cymbalta   Chronic migraine w/o aura, not intractable, w/o stat migr 10/24/2018   Cirrhosis ([place])    Colon polyps    Complication of anesthesia    problems waking up from anesthesia   Coronary artery disease 01/23/2019   Depression    on cymbalta   Diabetes mellitus with neuropathy ([place])     Diabetes with neuropathy 04/25/2013   Diverticulitis 03/2013   Dyslipidemia 05/29/2019   Eczema    Elevated LFTs    Epidural lipomatosis 10/05/2018   Essential (primary) hypertension 04/05/2019   Essential hypertension 10/10/2019   Fatty liver    GERD (gastroesophageal reflux disease) 04/28/2011   H/O craniotomy 05/07/2015   Headache 04/28/2011   Hepatitis 10/2017   NASH cirrhosis   History of kidney stones    Hyperlipidemia    Hypersomnia with sleep apnea 01/28/2015   Hypertension    IDA (iron deficiency anemia) 01/24/2019   Idiopathic intracranial hypertension 01/14/2017   Insomnia 04/26/2013   Kidney stone    Liver cirrhosis secondary to NASH (nonalcoholic steatohepatitis) ([place]) 01/02/2016   Low back pain 04/05/2019   Lower back injury 08/14/2019   Morbid obesity ([place])    Neuromuscular disorder ([place])    neuropathy   Neuropathy    Nonalcoholic steatohepatitis 08/05/5168   Obstructive hydrocephalus ([place]) 01/28/2015  OSA -- dx ~ 2012, cpap intolerant 09/04/2014    dx ~ 2012, cpap intolerant    PCP NOTES >>> 04/08/2015   Post-op pain 03/19/2019   Post-traumatic hydrocephalus ([place])    s/p shunts x 2 (first got infected )   Presence of cerebrospinal fluid drainage device 07/28/2011   Psoriasis    sees Dr [name]   Psoriatic arthritis ([place])    REM behavioral disorder 01/14/2017   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   see's Dr.Ortmann   Severe obesity (BMI >= 40) ([place]) 01/28/2015   SI (sacroiliac) joint dysfunction 08/14/2019   Sigmoid diverticulitis 04/25/2013   Sleep apnea    no CPAP      Spondylolisthesis, lumbar region 03/16/2019   Stomach ulcer    Testosterone deficiency 04/28/2011   VP (ventriculoperitoneal) shunt status 07/31/2020   Past Surgical History:  Procedure Laterality Date   [place]     VP shunts placed in 2007   CHOLECYSTECTOMY N/A 08/25/2017   Procedure: LAPAROSCOPIC CHOLECYSTECTOMY WITH INTRAOPERATIVE CHOLANGIOGRAM;  Surgeon:  [name], MD;  Location: [place];  Service: General;  Laterality: N/A;   COLONOSCOPY     CORONARY STENT INTERVENTION N/A 09/27/2020   Procedure: CORONARY STENT INTERVENTION;  Surgeon: Jettie Booze, MD;  Location: [facility];  Service: Cardiovascular;  Laterality: N/A;   INTRAVASCULAR ULTRASOUND/IVUS N/A 09/27/2020   Procedure: Intravascular Ultrasound/IVUS;  Surgeon: Jettie Booze, MD;  Location: [facility];  Service: Cardiovascular;  Laterality: N/A;   JOINT REPLACEMENT     total hip   LEFT HEART CATH N/A 09/27/2020   Procedure: Left Heart Cath;  Surgeon: Jettie Booze, MD;  Location: [facility];  Service: Cardiovascular;  Laterality: N/A;   LEFT HEART CATH AND CORONARY ANGIOGRAPHY N/A 09/24/2020   Procedure: LEFT HEART CATH AND CORONARY ANGIOGRAPHY;  Surgeon: Jettie Booze, MD;  Location: [facility];  Service: Cardiovascular;  Laterality: N/A;   LUMBAR FUSION  03/16/2019   SHOULDER SURGERY Left 2010   TOE SURGERY Left 2018   TONSILLECTOMY     as a child   TOTAL HIP ARTHROPLASTY Left 2011   UPPER GASTROINTESTINAL ENDOSCOPY  01/04/2020   VENTRICULOPERITONEAL SHUNT  2007   x2   Family History  Problem Relation Age of Onset   Other Mother    Lung cancer Father        alive, former smoker    Heart disease Brother        MI age 72   Other Brother        Murdered   Down syndrome Son    Diabetes Neg Hx    Prostate cancer Neg Hx    Colon cancer Neg Hx    Stomach cancer Neg Hx    Pancreatic cancer Neg Hx    Liver disease Neg Hx    Social History   Socioeconomic History   Marital status: Married    Spouse name: [name]   Number of children: 2   Years of education: Not on file   Highest education level: Not on file  Occupational History   Occupation: disabled   Tobacco Use   Smoking status: Never   Smokeless tobacco: Never  Vaping Use   Vaping Use: Never used  Substance and Sexual Activity   Alcohol use: No   Drug  use: No   Sexual activity: Yes    Partners: Female  Other Topics  Concern   Not on file  Social History Narrative   Household-- pt , wife, one adult son with Down's syndrome   younger son lives in [place] 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   Social Determinants of Health   Financial Resource Strain: Low Risk    Difficulty of Paying Living Expenses: Not hard at all  Food Insecurity: No Food Insecurity   Worried About Charity fundraiser in the Last Year: Never true   [place] in the Last Year: Never true  Transportation Needs: No Transportation Needs   Lack of Transportation (Medical): No   Lack of Transportation (Non-Medical): No  Physical Activity: Inactive   Days of Exercise per Week: 0 days   Minutes of Exercise per Session: 0 min  Stress: No Stress Concern Present   Feeling of Stress : Not at all  Social Connections: Moderately Integrated   Frequency of Communication with Friends and Family: More than three times a week   Frequency of Social Gatherings with Friends and Family: Once a week   Attends Religious Services: Never   Marine scientist or Organizations: Yes   Attends Music therapist: 1 to 4 times per year   Marital Status: Married    Tobacco Counseling Counseling given: Not Answered   Clinical Intake:  Pre-visit preparation completed: Yes  Pain : 0-10 Pain Score: 5  Pain Type: Chronic pain Pain Location: Back Pain Onset: More than a month ago Pain Frequency: Intermittent     BMI - recorded: 33.47 Nutritional Status: BMI > 30  Obese Nutritional Risks: None Diabetes: Yes CBG done?: No Did pt. bring in CBG monitor from home?: No (phone visit)  How often do you need to have someone help you when you read instructions, pamphlets, or other written materials from your doctor or pharmacy?: 1 - Never  Diabetes:  Is the patient diabetic?  Yes  If diabetic, was a CBG obtained today?  No   Did the patient bring in their glucometer from home?  No phone visit How often do you monitor your CBG's? occasionally.   Financial Strains and Diabetes Management:  Are you having any financial strains with the device, your supplies or your medication? No .  Does the patient want to be seen by Chronic Care Management for management of their diabetes?  No  Would the patient like to be referred to a Nutritionist or for Diabetic Management?  No   Diabetic Exams:  Diabetic Eye Exam: Completed 10/24/2020.  Diabetic Foot Exam: Pt has been advised about the importance in completing this exam.  To be completed by PCP.    Interpreter Needed?: No  Information entered by :: Caroleen Hamman LPN   Activities of Daily Living In your present state of health, do you have any difficulty performing the following activities: 03/18/2021 09/27/2020  Hearing? N N  Vision? N N  Difficulty concentrating or making decisions? N N  Walking or climbing stairs? N N  Dressing or bathing? N N  Doing errands, shopping? N N  Preparing Food and eating ? N -  Using the Toilet? N -  In the past six months, have you accidently leaked urine? N -  Do you have problems with loss of bowel control? N -  Managing your Medications? N -  Managing your Finances? N -  Housekeeping or managing your Housekeeping? N -  Some recent data might be hidden  Patient Care Team: Colon Branch, MD as PCP - General (Internal Medicine) Park Liter, MD as PCP - Cardiology (Cardiology) Iran Planas, MD as Consulting Physician (Orthopedic Surgery) Rexene Agent, MD as Attending Physician (Nephrology) Erline Levine, MD as Consulting Physician (Neurosurgery)  Indicate any recent Medical Services you may have received from other than [facility] providers in the past year (date may be approximate).     Assessment:   This is a routine wellness examination for Tanuj.  Hearing/Vision screen Hearing Screening - Comments:: No  issues Vision Screening - Comments:: Last eye exam-09/2020-[facility]  Dietary issues and exercise activities discussed: Current Exercise Habits: The patient does not participate in regular exercise at present, Exercise limited by: None identified   Goals Addressed             This Visit's Progress    Patient Stated       Lose some weight       Depression Screen PHQ 2/9 Scores 03/18/2021 12/25/2020 01/10/2020 08/08/2018 04/15/2017 01/02/2016 10/24/2015  PHQ - 2 Score 0 2 3 0 0 0 0  PHQ- 9 Score - 8 13 - - - -    Fall Risk Fall Risk  03/18/2021 12/25/2020 04/24/2019 08/08/2018 04/15/2017  Falls in the past year? 1 0 0 0 No  Number falls in past yr: 1 0 - - -  Injury with Fall? 0 0 - - -  Risk for fall due to : History of fall(s) - - - -  Risk for fall due to: Comment - - - - -  Follow up Falls prevention discussed Falls evaluation completed Falls evaluation completed - -    FALL RISK PREVENTION PERTAINING TO THE HOME:  Any stairs in or around the home? Yes  If so, are there any without handrails? Yes -patient aware of fall hazard Home free of loose throw rugs in walkways, pet beds, electrical cords, etc? Yes  Adequate lighting in your home to reduce risk of falls? Yes   ASSISTIVE DEVICES UTILIZED TO PREVENT FALLS:  Life alert? No  Use of a cane, walker or w/c? No  Grab bars in the bathroom? Yes  Shower chair or bench in shower? No  Elevated toilet seat or a handicapped toilet? No   TIMED UP AND GO:  Was the test performed? No .phone visit    Cognitive Function:Normal cognitive status assessed by this Nurse Health Advisor. No abnormalities found.          Immunizations Immunization History  Administered Date(s) Administered   Hep A / Hep B 10/28/2015, 11/07/2015, 11/20/2015   Influenza,inj,Quad PF,6+ Mos 04/24/2019, 06/11/2020   PFIZER Comirnaty(Gray Top)Covid-19 Tri-Sucrose Vaccine 11/29/2020   PFIZER(Purple Top)SARS-COV-2 Vaccination 09/03/2019,  09/26/2019, 02/24/2020   PNEUMOCOCCAL CONJUGATE-20 12/25/2020   Pneumococcal Polysaccharide-23 11/16/2017   Td 05/22/2013    TDAP status: Up to date  Flu Vaccine status: Due, Education has been provided regarding the importance of this vaccine. Advised may receive this vaccine at local pharmacy or Health Dept. Aware to provide a copy of the vaccination record if obtained from local pharmacy or Health Dept. Verbalized acceptance and understanding.  Pneumococcal vaccine status: Up to date  Covid-19 vaccine status: Completed vaccines  Qualifies for Shingles Vaccine? Yes   Zostavax completed No   Shingrix Completed?: No.    Education has been provided regarding the importance of this vaccine. Patient has been advised to call insurance company to determine out of pocket expense if they have not yet received  this vaccine. Advised may also receive vaccine at local pharmacy or Health Dept. Verbalized acceptance and understanding.  Screening Tests Health Maintenance  Topic Date Due   Zoster Vaccines- Shingrix (1 of 2) Never done   FOOT EXAM  12/26/2020   INFLUENZA VACCINE  01/27/2021   COVID-19 Vaccine (5 - Booster for Pfizer series) 03/31/2021   HEMOGLOBIN A1C  09/01/2021   OPHTHALMOLOGY EXAM  10/24/2021   COLONOSCOPY (Pts 45-49yr Insurance coverage will need to be confirmed)  01/04/2023   TETANUS/TDAP  05/23/2023   Hepatitis C Screening  Completed   HIV Screening  Completed   HPV VACCINES  Aged Out    Health Maintenance  Health Maintenance Due  Topic Date Due   Zoster Vaccines- Shingrix (1 of 2) Never done   FOOT EXAM  12/26/2020   INFLUENZA VACCINE  01/27/2021    Colorectal cancer screening: Type of screening: Colonoscopy. Completed 01/04/2020. Repeat every 3 years  Lung Cancer Screening: (Low Dose CT Chest recommended if Age 63-80years, 30 pack-year currently smoking OR have quit w/in 15years.) does not qualify.     Additional Screening:  Hepatitis C Screening: Completed  04/25/2013  Vision Screening: Recommended annual ophthalmology exams for early detection of glaucoma and other disorders of the eye. Is the patient up to date with their annual eye exam?  Yes  Who is the provider or what is the name of the office in which the patient attends annual eye exams? [facility]    Dental Screening: Recommended annual dental exams for proper oral hygiene  Community Resource Referral / Chronic Care Management: CRR required this visit?  No   CCM required this visit?  No      Plan:     I have personally reviewed and noted the following in the patient's chart:   Medical and social history Use of alcohol, tobacco or illicit drugs  Current medications and supplements including opioid prescriptions. Patient is not currently taking opioid prescriptions. Functional ability and status Nutritional status Physical activity Advanced directives List of other physicians Hospitalizations, surgeries, and ER visits in previous 12 months Vitals Screenings to include cognitive, depression, and falls Referrals and appointments  In addition, I have reviewed and discussed with patient certain preventive protocols, quality metrics, and best practice recommendations. A written personalized care plan for preventive services as well as general preventive health recommendations were provided to patient.   Due to this being a telephonic visit, the after visit summary with patients personalized plan was offered to patient via mail or my-chart. Patient would like to access on my-chart.   MMarta Antu LPN   90/13/1438 Nurse Health Advisor  Nurse Notes: None

## 2021-03-18 NOTE — Patient Instructions (Signed)
James [name] , Thank you for taking time to complete your Medicare Wellness Visit. I appreciate your ongoing commitment to your health goals. Please review the following plan we discussed and let me know if I can assist you in the future.   Screening recommendations/referrals: Colonoscopy: \Completed 01/04/2020-Due 01/04/2023 Recommended yearly ophthalmology/optometry visit for glaucoma screening and checkup Recommended yearly dental visit for hygiene and checkup  Vaccinations: Influenza vaccine: Due-May obtain vaccine at our office or your local pharmacy Pneumococcal vaccine: Up to date Tdap vaccine: Up to date-Due-05/23/2023 Shingles vaccine: Discuss with pharmacy   Covid-19: Up to date  Advanced directives: Please bring a copy for your chart  Conditions/risks identified: See problem list  Next appointment: Follow up in one year for your annual wellness visit.   Preventive Care 63 Years and Older, Male Preventive care refers to lifestyle choices and visits with your health care provider that can promote health and wellness. What does preventive care include? A yearly physical exam. This is also called an annual well check. Dental exams once or twice a year. Routine eye exams. Ask your health care provider how often you should have your eyes checked. Personal lifestyle choices, including: Daily care of your teeth and gums. Regular physical activity. Eating a healthy diet. Avoiding tobacco and drug use. Limiting alcohol use. Practicing safe sex. Taking low doses of aspirin every day. Taking vitamin and mineral supplements as recommended by your health care provider. What happens during an annual well check? The services and screenings done by your health care provider during your annual well check will depend on your age, overall health, lifestyle risk factors, and family history of disease. Counseling  Your health care provider may ask you questions about your: Alcohol use. Tobacco  use. Drug use. Emotional well-being. Home and relationship well-being. Sexual activity. Eating habits. History of falls. Memory and ability to understand (cognition). Work and work Statistician. Screening  You may have the following tests or measurements: Height, weight, and BMI. Blood pressure. Lipid and cholesterol levels. These may be checked every 5 years, or more frequently if you are over 63 years old. Skin check. Lung cancer screening. You may have this screening every year starting at age 63 if you have a 30-pack-year history of smoking and currently smoke or have quit within the past 15 years. Fecal occult blood test (FOBT) of the stool. You may have this test every year starting at age 63. Flexible sigmoidoscopy or colonoscopy. You may have a sigmoidoscopy every 5 years or a colonoscopy every 10 years starting at age 40. Prostate cancer screening. Recommendations will vary depending on your family history and other risks. Hepatitis C blood test. Hepatitis B blood test. Sexually transmitted disease (STD) testing. Diabetes screening. This is done by checking your blood sugar (glucose) after you have not eaten for a while (fasting). You may have this done every 1-3 years. Abdominal aortic aneurysm (AAA) screening. You may need this if you are a current or former smoker. Osteoporosis. You may be screened starting at age 63 if you are at high risk. Talk with your health care provider about your test results, treatment options, and if necessary, the need for more tests. Vaccines  Your health care provider may recommend certain vaccines, such as: Influenza vaccine. This is recommended every year. Tetanus, diphtheria, and acellular pertussis (Tdap, Td) vaccine. You may need a Td booster every 10 years. Zoster vaccine. You may need this after age 63. Pneumococcal 13-valent conjugate (PCV13) vaccine. One dose is recommended  after age 18. Pneumococcal polysaccharide (PPSV23) vaccine.  One dose is recommended after age 25. Talk to your health care provider about which screenings and vaccines you need and how often you need them. This information is not intended to replace advice given to you by your health care provider. Make sure you discuss any questions you have with your health care provider. Document Released: 07/12/2015 Document Revised: 03/04/2016 Document Reviewed: 04/16/2015 Elsevier Interactive Patient Education  2017 [employer] Prevention in the Home Falls can cause injuries. They can happen to people of all ages. There are many things you can do to make your home safe and to help prevent falls. What can I do on the outside of my home? Regularly fix the edges of walkways and driveways and fix any cracks. Remove anything that might make you trip as you walk through a door, such as a raised step or threshold. Trim any bushes or trees on the path to your home. Use bright outdoor lighting. Clear any walking paths of anything that might make someone trip, such as rocks or tools. Regularly check to see if handrails are loose or broken. Make sure that both sides of any steps have handrails. Any raised decks and porches should have guardrails on the edges. Have any leaves, snow, or ice cleared regularly. Use sand or salt on walking paths during winter. Clean up any spills in your garage right away. This includes oil or grease spills. What can I do in the bathroom? Use night lights. Install grab bars by the toilet and in the tub and shower. Do not use towel bars as grab bars. Use non-skid mats or decals in the tub or shower. If you need to sit down in the shower, use a plastic, non-slip stool. Keep the floor dry. Clean up any water that spills on the floor as soon as it happens. Remove soap buildup in the tub or shower regularly. Attach bath mats securely with double-sided non-slip rug tape. Do not have throw rugs and other things on the floor that can make  you trip. What can I do in the bedroom? Use night lights. Make sure that you have a light by your bed that is easy to reach. Do not use any sheets or blankets that are too big for your bed. They should not hang down onto the floor. Have a firm chair that has side arms. You can use this for support while you get dressed. Do not have throw rugs and other things on the floor that can make you trip. What can I do in the kitchen? Clean up any spills right away. Avoid walking on wet floors. Keep items that you use a lot in easy-to-reach places. If you need to reach something above you, use a strong step stool that has a grab bar. Keep electrical cords out of the way. Do not use floor polish or wax that makes floors slippery. If you must use wax, use non-skid floor wax. Do not have throw rugs and other things on the floor that can make you trip. What can I do with my stairs? Do not leave any items on the stairs. Make sure that there are handrails on both sides of the stairs and use them. Fix handrails that are broken or loose. Make sure that handrails are as long as the stairways. Check any carpeting to make sure that it is firmly attached to the stairs. Fix any carpet that is loose or worn. Avoid having throw  rugs at the top or bottom of the stairs. If you do have throw rugs, attach them to the floor with carpet tape. Make sure that you have a light switch at the top of the stairs and the bottom of the stairs. If you do not have them, ask someone to add them for you. What else can I do to help prevent falls? Wear shoes that: Do not have high heels. Have rubber bottoms. Are comfortable and fit you well. Are closed at the toe. Do not wear sandals. If you use a stepladder: Make sure that it is fully opened. Do not climb a closed stepladder. Make sure that both sides of the stepladder are locked into place. Ask someone to hold it for you, if possible. Clearly mark and make sure that you can  see: Any grab bars or handrails. First and last steps. Where the edge of each step is. Use tools that help you move around (mobility aids) if they are needed. These include: Canes. Walkers. Scooters. Crutches. Turn on the lights when you go into a dark area. Replace any light bulbs as soon as they burn out. Set up your furniture so you have a clear path. Avoid moving your furniture around. If any of your floors are uneven, fix them. If there are any pets around you, be aware of where they are. Review your medicines with your doctor. Some medicines can make you feel dizzy. This can increase your chance of falling. Ask your doctor what other things that you can do to help prevent falls. This information is not intended to replace advice given to you by your health care provider. Make sure you discuss any questions you have with your health care provider. Document Released: 04/11/2009 Document Revised: 11/21/2015 Document Reviewed: 07/20/2014 Elsevier Interactive Patient Education  2017 [employer].

## 2021-03-20 ENCOUNTER — Other Ambulatory Visit: Payer: Self-pay

## 2021-03-20 ENCOUNTER — Encounter: Payer: Self-pay | Admitting: Endocrinology

## 2021-03-20 ENCOUNTER — Other Ambulatory Visit (HOSPITAL_BASED_OUTPATIENT_CLINIC_OR_DEPARTMENT_OTHER): Payer: Self-pay

## 2021-03-20 ENCOUNTER — Ambulatory Visit (INDEPENDENT_AMBULATORY_CARE_PROVIDER_SITE_OTHER): Payer: 59 | Admitting: Endocrinology

## 2021-03-20 VITALS — BP 114/74 | HR 74 | Ht 71.0 in | Wt 244.0 lb

## 2021-03-20 DIAGNOSIS — I2510 Atherosclerotic heart disease of native coronary artery without angina pectoris: Secondary | ICD-10-CM

## 2021-03-20 DIAGNOSIS — E782 Mixed hyperlipidemia: Secondary | ICD-10-CM

## 2021-03-20 DIAGNOSIS — Z794 Long term (current) use of insulin: Secondary | ICD-10-CM | POA: Diagnosis not present

## 2021-03-20 DIAGNOSIS — E1165 Type 2 diabetes mellitus with hyperglycemia: Secondary | ICD-10-CM

## 2021-03-20 DIAGNOSIS — N289 Disorder of kidney and ureter, unspecified: Secondary | ICD-10-CM | POA: Diagnosis not present

## 2021-03-20 MED ORDER — FREESTYLE LIBRE 2 SENSOR MISC
2.0000 | 3 refills | Status: DC
  Filled 2021-03-20: qty 2, 28d supply, fill #0

## 2021-03-20 MED ORDER — FREESTYLE LIBRE 14 DAY SENSOR MISC
1.0000 [IU] | 4 refills | Status: DC
  Filled 2021-03-20: qty 2, 28d supply, fill #0
  Filled 2021-04-17: qty 2, 28d supply, fill #1
  Filled 2021-05-18: qty 2, 28d supply, fill #2
  Filled 2021-06-16: qty 2, 28d supply, fill #3
  Filled 2021-07-18: qty 2, 28d supply, fill #4

## 2021-03-20 MED ORDER — FREESTYLE LIBRE 2 READER DEVI
0 refills | Status: DC
  Filled 2021-03-20: qty 1, 30d supply, fill #0

## 2021-03-20 MED ORDER — OZEMPIC (2 MG/DOSE) 8 MG/3ML ~~LOC~~ SOPN
PEN_INJECTOR | SUBCUTANEOUS | 2 refills | Status: DC
  Filled 2021-03-20: qty 3, 28d supply, fill #0
  Filled 2021-06-16: qty 3, 28d supply, fill #1
  Filled 2021-10-21: qty 3, 28d supply, fill #2

## 2021-03-20 NOTE — Patient Instructions (Addendum)
Metformin 1050m 2x daily  Ozempic 178mtake 2 shots at a time  No keto pills  Call if sugars above these ranges  Check blood sugars on waking up 7 days a week  Also check blood sugars about 2 hours after meals and do this after different meals by rotation  Recommended blood sugar levels on waking up are 90-130 and about 2 hours after meal is 130-160  Please bring your blood sugar monitor to each visit, thank you

## 2021-03-20 NOTE — Progress Notes (Signed)
Patient ID: James [name], male   DOB: Jul 17, 1955, 65 y.o.   MRN: [id number]           Reason for Appointment: Follow-up for Type 2 Diabetes   History of Present Illness:          Date of diagnosis of type 2 diabetes mellitus:  ?  2014      Background history:  He is not clear how his diabetes was diagnosed, likely on routine testing. Initially had been treated with metformin and also tried on Amaryl Patient had progressive increase in his blood sugars since 1/17 with stopping metformin and being on the regimen of Amaryl and Januvia, he thinks his blood sugars went up to 601.  He was then started on basal bolus insulin   Lowest A1c was 6.8 previously  Recent history:    Non-insulin hypoglycemic drugs the patient is taking are:  metformin 500 mg twice daily, Ozempic 1.0 mg weekly  OMNIPOD insulin pump settings as follows Basal rates: 12 AM-6 AM = 0.1, 6 AM-11 AM = 0.5, 11 AM- 9:30 PM = 1.0 and 9:30 PM-12 AM = 1.2 Correction 1: 50 carbohydrate coverage 1: 1  His A1c is 6.8 last Fructosamine as high as 348  Current management, blood sugar patterns and problems identified:  He again has not been using his insulin pump consistently for various reasons  In the last 3 weeks he has not used it at all since he did not have a blood sugar meter  He says he does not have test strips and will not check his fingersticks  However in August his blood sugars are generally much better than before  He only tends to have high readings when he is eating larger meals, more carbohydrate or snacks especially late at night  Overall he thinks that he is cutting back on sweets and regular soft drinks  Does not appear to have lost any significant weight  He says he is taking keto pills OTC to help with maintaining his diet  He also appears to be more regular with his Ozempic injections weekly Not able to do much exercise except doing a lot of yard work Currently not able to get the freestyle  libre version 2 because his smart phone is not compatible for the app required   Side  effects from medications have been: None  Compliance with the medical regimen: Fair  Glucose monitoring:   Glucometer:  Freestyle libre 14-day  Recent CGM data interpretation as follows from 8/15 through 8/28  Blood sugar monitoring is significantly more complete compared to the last time however he has been out of his sensor for the last 3 weeks HYPERGLYCEMIA occurs only late at night after about 10 PM with blood sugar ranging generally between 160-200 Postprandial hyperglycemia during the day as only occasional late morning or afternoon However overall rise in blood sugar compared to Premeal readings is not excessive Blood sugars are excellent early morning and averaging in the 120s between 4-8 AM No hypoglycemia In the first week of the recording he was having somewhat more high readings after meals including breakfast Overall blood sugars are lower compared to last visit when he was only having 56% within target range  CGM use % of time 70  2-week average/GV 150/24  Time in range     81   %  % Time Above 180 18+1  % Time above 250   % Time Below 70  PRE-MEAL Fasting Lunch Dinner Bedtime Overall  Glucose range:       Averages: 122 161  194    POST-MEAL PC Breakfast PC Lunch PC Dinner  Glucose range:     Averages: 167 161 159   Previously   CGM use % of time 40  2-week average/GV 182+/-27  Time in range      56%  % Time Above 180 32+ 12  % Time above 250   % Time Below 70 0       Self-care:   Meal times are:  Breakfast variable, lunch: 12-3 PM Dinner: 6 PM    Dietician visit, most recent:09/2015               CDE consultation: 12/2018  Weight history:  Wt Readings from Last 3 Encounters:  03/20/21 244 lb (110.7 kg)  03/18/21 240 lb (108.9 kg)  01/23/21 245 lb (111.1 kg)    Glycemic control:   Lab Results  Component Value Date   HGBA1C 6.8 (H) 03/04/2021    HGBA1C 6.8 (H) 10/24/2020   HGBA1C 6.8 (H) 09/27/2020   Lab Results  Component Value Date   MICROALBUR 5.5 (H) 03/04/2021   LDLCALC 69 03/04/2021   CREATININE 1.39 03/04/2021    No visits with results within 1 Week(s) from this visit.  Latest known visit with results is:  Lab on 03/04/2021  Component Date Value Ref Range Status   Cholesterol 03/04/2021 139  0 - 200 mg/dL Final   ATP III Classification       Desirable:  < 200 mg/dL               Borderline High:  200 - 239 mg/dL          High:  > = 240 mg/dL   Triglycerides 03/04/2021 151.0 (A) 0.0 - 149.0 mg/dL Final   Normal:  <150 mg/dLBorderline High:  150 - 199 mg/dL   HDL 03/04/2021 39.90  >39.00 mg/dL Final   VLDL 03/04/2021 30.2  0.0 - 40.0 mg/dL Final   LDL Cholesterol 03/04/2021 69  0 - 99 mg/dL Final   Total CHOL/HDL Ratio 03/04/2021 3   Final                  Men          Women1/2 Average Risk     3.4          3.3Average Risk          5.0          4.42X Average Risk          9.6          7.13X Average Risk          15.0          11.0                       NonHDL 03/04/2021 99.00   Final   NOTE:  Non-HDL goal should be 30 mg/dL higher than patient's LDL goal (i.e. LDL goal of < 70 mg/dL, would have non-HDL goal of < 100 mg/dL)   Sodium 03/04/2021 138  135 - 145 mEq/L Final   Potassium 03/04/2021 4.3  3.5 - 5.1 mEq/L Final   Chloride 03/04/2021 106  96 - 112 mEq/L Final   CO2 03/04/2021 24  19 - 32 mEq/L Final   Glucose, Bld 03/04/2021 159 (A) 70 - 99 mg/dL Final   BUN  03/04/2021 18  6 - 23 mg/dL Final   Creatinine, Ser 03/04/2021 1.39  0.40 - 1.50 mg/dL Final   Total Bilirubin 03/04/2021 1.2  0.2 - 1.2 mg/dL Final   Alkaline Phosphatase 03/04/2021 67  39 - 117 U/L Final   AST 03/04/2021 25  0 - 37 U/L Final   ALT 03/04/2021 25  0 - 53 U/L Final   Total Protein 03/04/2021 7.4  6.0 - 8.3 g/dL Final   Albumin 03/04/2021 3.9  3.5 - 5.2 g/dL Final   GFR 03/04/2021 54.14 (A) >60.00 mL/min Final   Calculated using the  CKD-EPI Creatinine Equation (2021)   Calcium 03/04/2021 9.3  8.4 - 10.5 mg/dL Final   Hgb A1c MFr Bld 03/04/2021 6.8 (A) 4.6 - 6.5 % Final   Glycemic Control Guidelines for People with Diabetes:Non Diabetic:  <6%Goal of Therapy: <7%Additional Action Suggested:  >8%    Microalb, Ur 03/04/2021 5.5 (A) 0.0 - 1.9 mg/dL Final   Creatinine,U 03/04/2021 279.4  mg/dL Final   Microalb Creat Ratio 03/04/2021 2.0  0.0 - 30.0 mg/g Final       Allergies as of 03/20/2021       Reactions   Bee Venom Anaphylaxis   Hydrocodone Bit-homatrop Mbr Other (See Comments)   Hallucinations, confusion, delirium Depressed feeling   Toradol [ketorolac Tromethamine] Other (See Comments)   Hallucinations, confusion, delirium   Morphine And Related Other (See Comments)   Hallucinations, back in the 80s. States has taken vicodin before w/o problems    Sulfa Drugs Cross Reactors Rash        Medication List        Accurate as of March 20, 2021 11:15 AM. If you have any questions, ask your nurse or doctor.          STOP taking these medications    Ozempic (1 MG/DOSE) 4 MG/3ML Sopn Generic drug: Semaglutide (1 MG/DOSE) Replaced by: Ozempic (2 MG/DOSE) 8 MG/3ML Sopn Stopped by: Elayne Snare, MD       TAKE these medications    amLODipine 5 MG tablet Commonly known as: NORVASC Take 1 tablet (5 mg total) by mouth daily.   aspirin 81 MG EC tablet Take 1 tablet (81 mg total) by mouth daily. Swallow whole.   atorvastatin 80 MG tablet Commonly known as: LIPITOR Take 1 tablet (80 mg total) by mouth at bedtime.   benzonatate 100 MG capsule Commonly known as: Tessalon Perles Take 1 capsule (100 mg total) by mouth 3 (three) times daily as needed for cough.   carvedilol 12.5 MG tablet Commonly known as: COREG TAKE 1 TABLET (12.5 MG TOTAL) BY MOUTH 2 (TWO) TIMES DAILY WITH A MEAL.   clonazePAM 0.5 MG tablet Commonly known as: KLONOPIN TAKE 1 TABLET BY MOUTH EVERY NIGHT AT BEDTIME   clopidogrel  75 MG tablet Commonly known as: Plavix Take 1 tablet (75 mg total) by mouth daily.   clopidogrel 75 MG tablet Commonly known as: PLAVIX TAKE 1 TABLET (75 MG TOTAL) BY MOUTH DAILY.   DULoxetine 60 MG capsule Commonly known as: CYMBALTA TAKE 2 CAPSULES (120 MG TOTAL) BY MOUTH DAILY. What changed:  how much to take how to take this when to take this   EPINEPHrine 0.3 mg/0.3 mL Soaj injection Commonly known as: EpiPen 2-Pak Inject 0.3 mLs (0.3 mg total) into the muscle once as needed for up to 1 dose for anaphylaxis.   fenofibrate micronized 134 MG capsule Commonly known as: LOFIBRA TAKE 1 CAPSULE BY MOUTH THREE  TIMES WEEKLY   FreeStyle Libre 14 Day Sensor Misc Apply 1 sensor every 14 (fourteen) days. What changed: how much to take Changed by: Elayne Snare, MD   FreeStyle Libre 2 Reader [facility] Use to check blood sugar daily Started by: Elayne Snare, MD   gabapentin 600 MG tablet Commonly known as: NEURONTIN TAKE 1 TABLET (600 MG TOTAL) BY MOUTH 2 (TWO) TIMES DAILY. What changed:  how much to take how to take this when to take this   glimepiride 4 MG tablet Commonly known as: AMARYL TAKE 1 TABLET BY MOUTH ONCE DAILY BEFORE BREAKFAST What changed:  how much to take when to take this   HumaLOG 100 UNIT/ML injection Generic drug: insulin lispro Use pump to inject 50 units once daily   Humira Pen 40 MG/0.8ML Pnkt Generic drug: Adalimumab INJECT 40 MG (0.8 ML) UNDER THE SKIN EVERY OTHER WEEK.   isosorbide mononitrate 30 MG 24 hr tablet Commonly known as: IMDUR TAKE 1 TABLET BY MOUTH ONCE DAILY What changed: how much to take   metFORMIN 500 MG tablet Commonly known as: GLUCOPHAGE Take 2 tablets by mouth twice daily   nitroGLYCERIN 0.4 MG SL tablet Commonly known as: NITROSTAT PLACE 1 TABLETS UNDER THE TONGUE EVERY 5 MINS AS NEEDED FOR CHEST PAIN   Omnipod 5 G6 Pod (Gen 5) Misc CHANGE EVERY 72 HOURS AS DIRECTED   Ozempic (2 MG/DOSE) 8 MG/3ML Sopn Generic drug:  Semaglutide (2 MG/DOSE) Inject 2 mg into the skin weekly Replaces: Ozempic (1 MG/DOSE) 4 MG/3ML Sopn Started by: Elayne Snare, MD   pantoprazole 40 MG tablet Commonly known as: PROTONIX Take 1 tablet (40 mg total) by mouth daily.   Vascepa 1 g capsule Generic drug: icosapent Ethyl Take 2 capsules (2 g total) by mouth 2 (two) times daily.        Allergies:  Allergies  Allergen Reactions   Bee Venom Anaphylaxis   Hydrocodone Bit-Homatrop Mbr Other (See Comments)    Hallucinations, confusion, delirium Depressed feeling   Toradol [Ketorolac Tromethamine] Other (See Comments)    Hallucinations, confusion, delirium   Morphine And Related Other (See Comments)    Hallucinations, back in the 80s. States has taken vicodin before w/o problems    Sulfa Drugs Cross Reactors Rash    Past Medical History:  Diagnosis Date   Abnormal cardiac CT angiography    Acid reflux    Annual physical exam 04/08/2015   Arthritis    Atypical chest pain 06/13/2020   Blood transfusion without reported diagnosis    Body mass index (BMI) 35.0-35.9, adult 04/05/2019   Chronic fatigue 01/28/2015   Chronic headaches    on cymbalta   Chronic migraine w/o aura, not intractable, w/o stat migr 10/24/2018   Cirrhosis ([place])    Colon polyps    Complication of anesthesia    problems waking up from anesthesia   Coronary artery disease 01/23/2019   Depression    on cymbalta   Diabetes mellitus with neuropathy ([place])    Diabetes with neuropathy 04/25/2013   Diverticulitis 03/2013   Dyslipidemia 05/29/2019   Eczema    Elevated LFTs    Epidural lipomatosis 10/05/2018   Essential (primary) hypertension 04/05/2019   Essential hypertension 10/10/2019   Fatty liver    GERD (gastroesophageal reflux disease) 04/28/2011   H/O craniotomy 05/07/2015   Headache 04/28/2011   Hepatitis 10/2017   NASH cirrhosis   History of kidney stones    Hyperlipidemia    Hypersomnia with sleep apnea 01/28/2015  Hypertension    IDA  (iron deficiency anemia) 01/24/2019   Idiopathic intracranial hypertension 01/14/2017   Insomnia 04/26/2013   Kidney stone    Liver cirrhosis secondary to NASH (nonalcoholic steatohepatitis) ([place]) 01/02/2016   Low back pain 04/05/2019   Lower back injury 08/14/2019   Morbid obesity ([place])    Neuromuscular disorder ([place])    neuropathy   Neuropathy    Nonalcoholic steatohepatitis 01/31/4626   Obstructive hydrocephalus ([place]) 01/28/2015   OSA -- dx ~ 2012, cpap intolerant 09/04/2014    dx ~ 2012, cpap intolerant    PCP NOTES >>> 04/08/2015   Post-op pain 03/19/2019   Post-traumatic hydrocephalus ([place])    s/p shunts x 2 (first got infected )   Presence of cerebrospinal fluid drainage device 07/28/2011   Psoriasis    sees Dr [name]   Psoriatic arthritis ([place])    REM behavioral disorder 01/14/2017   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   see's Dr.Ortmann   Severe obesity (BMI >= 40) ([place]) 01/28/2015   SI (sacroiliac) joint dysfunction 08/14/2019   Sigmoid diverticulitis 04/25/2013   Sleep apnea    no CPAP      Spondylolisthesis, lumbar region 03/16/2019   Stomach ulcer    Testosterone deficiency 04/28/2011   VP (ventriculoperitoneal) shunt status 07/31/2020    Past Surgical History:  Procedure Laterality Date   [place]     VP shunts placed in 2007   CHOLECYSTECTOMY N/A 08/25/2017   Procedure: LAPAROSCOPIC CHOLECYSTECTOMY WITH INTRAOPERATIVE CHOLANGIOGRAM;  Surgeon: [name], MD;  Location: [facility];  Service: General;  Laterality: N/A;   COLONOSCOPY     CORONARY STENT INTERVENTION N/A 09/27/2020   Procedure: CORONARY STENT INTERVENTION;  Surgeon: Jettie Booze, MD;  Location: [facility];  Service: Cardiovascular;  Laterality: N/A;   INTRAVASCULAR ULTRASOUND/IVUS N/A 09/27/2020   Procedure: Intravascular Ultrasound/IVUS;  Surgeon: Jettie Booze, MD;  Location: [facility];  Service: Cardiovascular;  Laterality: N/A;   JOINT  REPLACEMENT     total hip   LEFT HEART CATH N/A 09/27/2020   Procedure: Left Heart Cath;  Surgeon: Jettie Booze, MD;  Location: [facility];  Service: Cardiovascular;  Laterality: N/A;   LEFT HEART CATH AND CORONARY ANGIOGRAPHY N/A 09/24/2020   Procedure: LEFT HEART CATH AND CORONARY ANGIOGRAPHY;  Surgeon: Jettie Booze, MD;  Location: [facility];  Service: Cardiovascular;  Laterality: N/A;   LUMBAR FUSION  03/16/2019   SHOULDER SURGERY Left 2010   TOE SURGERY Left 2018   TONSILLECTOMY     as a child   TOTAL HIP ARTHROPLASTY Left 2011   UPPER GASTROINTESTINAL ENDOSCOPY  01/04/2020   VENTRICULOPERITONEAL SHUNT  2007   x2    Family History  Problem Relation Age of Onset   Other Mother    Lung cancer Father        alive, former smoker    Heart disease Brother        MI age 29   Other Brother        Murdered   Down syndrome Son    Diabetes Neg Hx    Prostate cancer Neg Hx    Colon cancer Neg Hx    Stomach cancer Neg Hx    Pancreatic cancer Neg Hx    Liver disease Neg Hx     Social History:  reports that he has never smoked. He has never used smokeless tobacco.  He reports that he does not drink alcohol and does not use drugs.    Review of Systems      HYPERTENSION: On carvedilol with good control Hypertension managed by his PCP   BP Readings from Last 3 Encounters:  03/20/21 114/74  01/03/21 118/80  12/25/20 128/84   RENAL dysfunction: Has had stable creatinine levels, now appearing to be more normal No history of microalbuminuria  Lab Results  Component Value Date   CREATININE 1.39 03/04/2021   CREATININE 1.55 (H) 12/31/2020   CREATININE 1.54 (H) 12/25/2020    Lipid history: He is on Lipitor  20 mg  Is on fenofibrate 3 times a week for high triglycerides, Was given Vascepa also but he says it caused headaches with the full doses and has been asked to try 1 g twice daily  LDL is below 70 and triglycerides are improving   Lab  Results  Component Value Date   CHOL 139 03/04/2021   CHOL 145 12/31/2020   CHOL 152 10/30/2020   Lab Results  Component Value Date   HDL 39.90 03/04/2021   HDL 37.30 (L) 12/31/2020   HDL 39 (L) 10/30/2020   Lab Results  Component Value Date   LDLCALC 69 03/04/2021   LDLCALC 68 10/30/2020   [place] 106 (H) 04/28/2019   Lab Results  Component Value Date   TRIG 151.0 (H) 03/04/2021   TRIG 298.0 (H) 12/31/2020   TRIG 278 (H) 10/30/2020   Lab Results  Component Value Date   CHOLHDL 3 03/04/2021   CHOLHDL 4 12/31/2020   CHOLHDL 3.9 10/30/2020   Lab Results  Component Value Date   LDLDIRECT 67.0 12/31/2020   LDLDIRECT 91.0 06/11/2020   LDLDIRECT 83.0 10/09/2019            He is being followed by gastroenterology for liver cirrhosis secondary to [facility]  Lab Results  Component Value Date   ALT 25 03/04/2021     He has symptomatic painful neuropathy and is on gabapentin 600 mg twice a day and  Cymbalta 60 mg  Has been followed by neurologist    Review of Systems    Physical Examination:  BP 114/74   Pulse 74   Ht 5' 11"  (1.803 m)   Wt 244 lb (110.7 kg)   SpO2 99%   BMI 34.03 kg/m       ASSESSMENT:  Diabetes type 2 with obesity  See history of present illness for discussion of current diabetes management, blood sugar patterns and problems identified  Currently on a regimen of insulin with the OmniPod pump, Ozempic 1 mg and Metformin 500 mg twice daily  A1c 6.8 again  He has generally been following his diet better with cutting out on sweets and snacks and reducing regular soft drinks  Does not appear to be having significant hyperglycemia although his fasting glucose in the lab was 157  Currently does not appear to be insulin deficient with not using his insulin pump for at least 3 weeks   He has not lost any significant amount of weight  However has tried to take Ozempic more regularly    HYPERLIPIDEMIA: His triglycerides are improving from  better diet and somewhat better diabetes control Also able to take lower doses of Vascepa  Again LDL is controlled  PLAN: He can go back to using the regular freestyle libre since he cannot get the version 2 to work on his phone  Continue to improve diet and avoid high-fat foods and snacks  especially late in the evening Leave off the insulin pump for now unless he has persistent high blood sugars However if he continues to have high postprandial or fasting readings he will need to let us know, may only need mealtime insulin at dinner  Walking much as possible for exercise increase OZEMPIC to 2 mg, sample and prescription given Since his renal function is improved we will go up to 1000 twice daily of metformin Needs to avoid OTC weight loss medicine such as keto pills Blood sugar targets discussed  Continue current lipid-lowering medications  Patient Instructions  Metformin 1079m 2x daily  Ozempic 183mtake 2 shots at a time  No keto pills  Call if sugars above these ranges  Check blood sugars on waking up 7 days a week  Also check blood sugars about 2 hours after meals and do this after different meals by rotation  Recommended blood sugar levels on waking up are 90-130 and about 2 hours after meal is 130-160  Please bring your blood sugar monitor to each visit, thank you      AjElayne Snare/22/2022, 11:15 AM

## 2021-03-21 ENCOUNTER — Other Ambulatory Visit: Payer: Self-pay | Admitting: Cardiology

## 2021-03-21 ENCOUNTER — Other Ambulatory Visit: Payer: Self-pay | Admitting: Student

## 2021-03-21 ENCOUNTER — Other Ambulatory Visit: Payer: Self-pay | Admitting: Internal Medicine

## 2021-03-21 ENCOUNTER — Other Ambulatory Visit (HOSPITAL_BASED_OUTPATIENT_CLINIC_OR_DEPARTMENT_OTHER): Payer: Self-pay

## 2021-03-21 MED ORDER — ISOSORBIDE MONONITRATE ER 30 MG PO TB24
ORAL_TABLET | Freq: Every day | ORAL | 1 refills | Status: DC
  Filled 2021-03-21: qty 90, 90d supply, fill #0
  Filled 2021-06-16: qty 90, 90d supply, fill #1

## 2021-03-21 MED ORDER — AMLODIPINE BESYLATE 5 MG PO TABS
5.0000 mg | ORAL_TABLET | Freq: Every day | ORAL | 2 refills | Status: DC
  Filled 2021-03-21: qty 90, 90d supply, fill #0
  Filled 2021-06-16: qty 90, 90d supply, fill #1
  Filled 2021-09-18: qty 90, 90d supply, fill #2

## 2021-03-21 MED ORDER — DULOXETINE HCL 60 MG PO CPEP
ORAL_CAPSULE | ORAL | 1 refills | Status: DC
  Filled 2021-03-21: qty 180, 90d supply, fill #0
  Filled 2021-06-16: qty 180, 90d supply, fill #1

## 2021-03-21 MED FILL — Gabapentin Tab 600 MG: ORAL | 90 days supply | Qty: 180 | Fill #0 | Status: AC

## 2021-03-25 ENCOUNTER — Telehealth: Payer: Self-pay | Admitting: Neurology

## 2021-03-25 NOTE — Telephone Encounter (Signed)
Patient's next Botox appointment is 11/1. [facility] now requires PA for Botox. I called [facility] @ [phone] and spoke with Thayer Headings. She was able to initiate PA for CPT J0585/64615. PA is requesting  units of Botox every 12 weeks for dx G43.709. Thayer Headings advises that clinical information will need to be faxed to [phone] for review. The request is pending. Pending [phone].

## 2021-03-26 ENCOUNTER — Encounter: Payer: Self-pay | Admitting: Endocrinology

## 2021-03-28 ENCOUNTER — Other Ambulatory Visit (HOSPITAL_BASED_OUTPATIENT_CLINIC_OR_DEPARTMENT_OTHER): Payer: Self-pay

## 2021-03-28 ENCOUNTER — Other Ambulatory Visit: Payer: Self-pay | Admitting: Endocrinology

## 2021-03-31 ENCOUNTER — Other Ambulatory Visit (HOSPITAL_BASED_OUTPATIENT_CLINIC_OR_DEPARTMENT_OTHER): Payer: Self-pay

## 2021-03-31 ENCOUNTER — Other Ambulatory Visit: Payer: Self-pay

## 2021-03-31 DIAGNOSIS — N5201 Erectile dysfunction due to arterial insufficiency: Secondary | ICD-10-CM | POA: Diagnosis not present

## 2021-03-31 DIAGNOSIS — R351 Nocturia: Secondary | ICD-10-CM | POA: Diagnosis not present

## 2021-03-31 DIAGNOSIS — Z794 Long term (current) use of insulin: Secondary | ICD-10-CM

## 2021-03-31 MED ORDER — METFORMIN HCL 500 MG PO TABS
1000.0000 mg | ORAL_TABLET | Freq: Two times a day (BID) | ORAL | 3 refills | Status: DC
  Filled 2021-03-31: qty 120, 30d supply, fill #0
  Filled 2021-05-06: qty 120, 30d supply, fill #1
  Filled 2021-06-16: qty 120, 30d supply, fill #2
  Filled 2021-07-17: qty 120, 30d supply, fill #3

## 2021-03-31 NOTE — Telephone Encounter (Signed)
Pt called in needing new Rx for Metformin. Rx sent to preferred pharmacy. Patient also states that he took 2 of the 52m inject as told to last visit and he did that twice and was sick for 3 days. Patient states that he is going back to only doing 1 injection.

## 2021-04-04 ENCOUNTER — Other Ambulatory Visit (HOSPITAL_COMMUNITY): Payer: Self-pay

## 2021-04-07 ENCOUNTER — Other Ambulatory Visit (HOSPITAL_COMMUNITY): Payer: Self-pay

## 2021-04-09 ENCOUNTER — Other Ambulatory Visit: Payer: Self-pay | Admitting: Pharmacist

## 2021-04-09 ENCOUNTER — Other Ambulatory Visit: Payer: Self-pay

## 2021-04-09 ENCOUNTER — Other Ambulatory Visit (HOSPITAL_COMMUNITY): Payer: Self-pay

## 2021-04-09 DIAGNOSIS — Z006 Encounter for examination for normal comparison and control in clinical research program: Secondary | ICD-10-CM

## 2021-04-09 MED ORDER — HUMIRA PEN 40 MG/0.8ML ~~LOC~~ PNKT
PEN_INJECTOR | SUBCUTANEOUS | 0 refills | Status: DC
  Filled 2021-04-09: qty 2, 28d supply, fill #0

## 2021-04-09 NOTE — Research (Signed)
Subject # [id number] [employer] Lp(a) [id number] Site # [id number]  SEX [x]    Male                      []    Male  Ethnicity []   Hispanic or Latino   [x]   Not Hispanic or Latino  Race [x]   White                 []   Black or African American  []   Asian []   American [place] or [place] Native            []   Native Hawaiian or Other Pacific Islander                      []   Other  Other   Age 65  Subject Group [x]    Local Lab           []   Historical Lp(a) value                                       Results:  Future research [x]    Yes                    []   No    [employer] [id number] Site # [id number] Subject ID # [id number]         ELIGIBILITY CRITERIA WORKSHEET INCLUSION CRITERIA   Subject has provided informed consent prior to the initiation of any study specific activities/procedures []   Age 63 to 47 years [x]   MI (presumed type 1) OR []   PCI (with high-risk features) with at least 1 of the following: [x]   Age >24 []   Diabetes mellitus  HbA1c:6.8 [x]   History of ischemic stroke []   History of peripheral arterial disease []   Residual stenosis ? 50% []   Multivessel PCI (ie, ? 2 vessels, including branch arteries []   EXCLUSIONS THE FOLLOWING N/A [x]   Subjects known to be currently receiving investigational drug in a clinical study that is anticipated to last > 1 year []   Known Lp(a) value <13m/dL or < 200nmol/L []   Subject has a diagnosis of end-stage renal disease or requires dialysis. []   Poorly controlled (glycated hemoglobin [HbA1c] > 10%) diabetes mellitus (type 1 or type 2) []   Subject is receiving or has received lipoprotein apheresis to reduce Lp(a) within 3 months prior to enrollment. []   Known uncontrolled or recurrent ventricular tachycardia in the past 3 months prior to enrollment. []   Known malignancy (except non-melanoma skin cancers, cervical in situ carcinoma, breast ductal carcinoma in situ, or stage 1 prostate carcinoma) within the last 5 years prior to enrollment. []   Known  history or evidence of clinically significant disease (eg, respiratory, gastrointestinal, or psychiatric disease) or unstable disorder or biomarker that, in the opinion of the investigator(s), would result in life expectancy < 5 years. []   Known hemorrhagic stroke. []    [employer] Lp(a) Informed Consent   Subject Name: James [name] Subject met inclusion and exclusion criteria.  The informed consent form, study requirements and expectations were reviewed with the subject and questions and concerns were addressed prior to the signing of the consent form.  The subject verbalized understanding of the trial requirements.  The subject agreed to participate in the [facility] trial and signed the informed consent at 1320 on 04-09-21.  The informed consent was obtained prior to  performance of any protocol-specific procedures for the subject.  A copy of the signed informed consent was given to the subject and a copy was placed in the subject's medical record.   James [name]  [employer] Consent Version 2 Protocol Version 2

## 2021-04-09 NOTE — Telephone Encounter (Signed)
Received approval from [facility]. [place] [phone] (03/21/21- 03/20/22).

## 2021-04-15 LAB — LIPOPROTEIN A (LPA): Lipoprotein (a): 92.6 nmol/L — ABNORMAL HIGH (ref <75.0)

## 2021-04-15 NOTE — Progress Notes (Signed)
Patient notified of the results  

## 2021-04-17 ENCOUNTER — Other Ambulatory Visit (HOSPITAL_BASED_OUTPATIENT_CLINIC_OR_DEPARTMENT_OTHER): Payer: Self-pay

## 2021-04-18 ENCOUNTER — Encounter: Payer: Self-pay | Admitting: Cardiology

## 2021-04-18 ENCOUNTER — Ambulatory Visit (INDEPENDENT_AMBULATORY_CARE_PROVIDER_SITE_OTHER): Payer: 59 | Admitting: Cardiology

## 2021-04-18 ENCOUNTER — Other Ambulatory Visit (HOSPITAL_BASED_OUTPATIENT_CLINIC_OR_DEPARTMENT_OTHER): Payer: Self-pay

## 2021-04-18 ENCOUNTER — Other Ambulatory Visit: Payer: Self-pay

## 2021-04-18 VITALS — BP 98/60 | HR 82 | Ht 71.0 in | Wt 242.0 lb

## 2021-04-18 DIAGNOSIS — I10 Essential (primary) hypertension: Secondary | ICD-10-CM | POA: Diagnosis not present

## 2021-04-18 DIAGNOSIS — K7581 Nonalcoholic steatohepatitis (NASH): Secondary | ICD-10-CM | POA: Diagnosis not present

## 2021-04-18 DIAGNOSIS — K7460 Unspecified cirrhosis of liver: Secondary | ICD-10-CM

## 2021-04-18 DIAGNOSIS — I2510 Atherosclerotic heart disease of native coronary artery without angina pectoris: Secondary | ICD-10-CM

## 2021-04-18 NOTE — Progress Notes (Signed)
Cardiology Office Note:    Date:  04/18/2021   ID:  James [name], DOB 08-11-1955, MRN [id number]  PCP:  Colon Branch, MD  Cardiologist:  Jenne Campus, MD    Referring MD: Colon Branch, MD   Chief Complaint  Patient presents with   Follow-up  I am doing fine  History of Present Illness:    James [name] is a 65 y.o. male  with past medical history significant for diabetes, essential hypertension, dyslipidemia, nonalcoholic liver cirrhosis.  He started having some atypical symptoms eventually coronary CT angio has been done which showed lesion in the mid left anterior descending artery that was hemodynamically significant.  He had of having cardiac catheterization just few weeks ago and a month ago he underwent staged procedure for LAD stenting.  Procedure was successful and he is doing quite well since that time.  He did not have typical chest pain tightness squeezing pressure burning chest, now he said he is doing better he got much more energy he can work longer he does not sleep as much.  Today he is doing well denies of any chest pain tightness squeezing pressure mid chest except for very rare occasions that he will develop some chest pain typically when he sits walking climbing stairs does not bring it up.  Past Medical History:  Diagnosis Date   Abnormal cardiac CT angiography    Acid reflux    Annual physical exam 04/08/2015   Arthritis    Atypical chest pain 06/13/2020   Blood transfusion without reported diagnosis    Body mass index (BMI) 35.0-35.9, adult 04/05/2019   Chronic fatigue 01/28/2015   Chronic headaches    on cymbalta   Chronic migraine w/o aura, not intractable, w/o stat migr 10/24/2018   Cirrhosis ([place])    Colon polyps    Complication of anesthesia    problems waking up from anesthesia   Coronary artery disease 01/23/2019   Depression    on cymbalta   Diabetes mellitus with neuropathy ([place])    Diabetes with neuropathy 04/25/2013   Diverticulitis  03/2013   Dyslipidemia 05/29/2019   Eczema    Elevated LFTs    Epidural lipomatosis 10/05/2018   Essential (primary) hypertension 04/05/2019   Essential hypertension 10/10/2019   Fatty liver    GERD (gastroesophageal reflux disease) 04/28/2011   H/O craniotomy 05/07/2015   Headache 04/28/2011   Hepatitis 10/2017   NASH cirrhosis   History of kidney stones    Hyperlipidemia    Hypersomnia with sleep apnea 01/28/2015   Hypertension    IDA (iron deficiency anemia) 01/24/2019   Idiopathic intracranial hypertension 01/14/2017   Insomnia 04/26/2013   Kidney stone    Liver cirrhosis secondary to NASH (nonalcoholic steatohepatitis) ([place]) 01/02/2016   Low back pain 04/05/2019   Lower back injury 08/14/2019   Morbid obesity ([place])    Neuromuscular disorder ([place])    neuropathy   Neuropathy    Nonalcoholic steatohepatitis 09/05/7671   Obstructive hydrocephalus ([place]) 01/28/2015   OSA -- dx ~ 2012, cpap intolerant 09/04/2014    dx ~ 2012, cpap intolerant    PCP NOTES >>> 04/08/2015   Post-op pain 03/19/2019   Post-traumatic hydrocephalus (HCC)    s/p shunts x 2 (first got infected )   Presence of cerebrospinal fluid drainage device 07/28/2011   Psoriasis    sees Dr [name]   Psoriatic arthritis ([place])    REM behavioral disorder 01/14/2017   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  see's Dr.Ortmann   Severe obesity (BMI >= 40) (HCC) 01/28/2015   SI (sacroiliac) joint dysfunction 08/14/2019   Sigmoid diverticulitis 04/25/2013   Sleep apnea    no CPAP      Spondylolisthesis, lumbar region 03/16/2019   Stomach ulcer    Testosterone deficiency 04/28/2011   VP (ventriculoperitoneal) shunt status 07/31/2020    Past Surgical History:  Procedure Laterality Date   [employer]     VP shunts placed in 2007   CHOLECYSTECTOMY N/A 08/25/2017   Procedure: LAPAROSCOPIC CHOLECYSTECTOMY WITH INTRAOPERATIVE CHOLANGIOGRAM;  Surgeon: [name], MD;  Location: [facility];  Service:  General;  Laterality: N/A;   COLONOSCOPY     CORONARY STENT INTERVENTION N/A 09/27/2020   Procedure: CORONARY STENT INTERVENTION;  Surgeon: Jettie Booze, MD;  Location: [facility];  Service: Cardiovascular;  Laterality: N/A;   INTRAVASCULAR ULTRASOUND/IVUS N/A 09/27/2020   Procedure: Intravascular Ultrasound/IVUS;  Surgeon: Jettie Booze, MD;  Location: [facility];  Service: Cardiovascular;  Laterality: N/A;   JOINT REPLACEMENT     total hip   LEFT HEART CATH N/A 09/27/2020   Procedure: Left Heart Cath;  Surgeon: Jettie Booze, MD;  Location: [facility];  Service: Cardiovascular;  Laterality: N/A;   LEFT HEART CATH AND CORONARY ANGIOGRAPHY N/A 09/24/2020   Procedure: LEFT HEART CATH AND CORONARY ANGIOGRAPHY;  Surgeon: Jettie Booze, MD;  Location: [facility];  Service: Cardiovascular;  Laterality: N/A;   LUMBAR FUSION  03/16/2019   SHOULDER SURGERY Left 2010   TOE SURGERY Left 2018   TONSILLECTOMY     as a child   TOTAL HIP ARTHROPLASTY Left 2011   UPPER GASTROINTESTINAL ENDOSCOPY  01/04/2020   VENTRICULOPERITONEAL SHUNT  2007   x2    Current Medications: Current Meds  Medication Sig   Adalimumab (HUMIRA PEN) 40 MG/0.8ML PNKT INJECT 40 MG (0.8 ML) UNDER THE SKIN EVERY OTHER WEEK. (Patient taking differently: Inject 40 mg into the skin every 14 (fourteen) days.)   amLODipine (NORVASC) 5 MG tablet Take 1 tablet (5 mg total) by mouth daily.   aspirin EC 81 MG EC tablet Take 1 tablet (81 mg total) by mouth daily. Swallow whole.   atorvastatin (LIPITOR) 80 MG tablet Take 1 tablet (80 mg total) by mouth at bedtime.   benzonatate (TESSALON PERLES) 100 MG capsule Take 1 capsule (100 mg total) by mouth 3 (three) times daily as needed for cough.   carvedilol (COREG) 12.5 MG tablet TAKE 1 TABLET (12.5 MG TOTAL) BY MOUTH 2 (TWO) TIMES DAILY WITH A MEAL. (Patient taking differently: Take 12.5 mg by mouth 2 (two) times daily with a meal.)    clonazePAM (KLONOPIN) 0.5 MG tablet TAKE 1 TABLET BY MOUTH EVERY NIGHT AT BEDTIME (Patient taking differently: Take 0.5 mg by mouth at bedtime.)   clopidogrel (PLAVIX) 75 MG tablet TAKE 1 TABLET (75 MG TOTAL) BY MOUTH DAILY.   Continuous Blood Gluc Receiver (FREESTYLE LIBRE 2 READER) DEVI Use to check blood sugar daily (Patient taking differently: 1 each by Other route See admin instructions. Use to check blood sugar daily)   Continuous Blood Gluc Sensor (FREESTYLE LIBRE 14 DAY SENSOR) MISC Apply 1 sensor every 14 (fourteen) days. (Patient taking differently: 1 Units by Does not apply route every 14 (fourteen) days. Glucose check)   DULoxetine (CYMBALTA) 60 MG capsule TAKE 2 CAPSULES (120 MG TOTAL) BY MOUTH DAILY. (Patient taking differently: Take 60 mg  by mouth daily.)   EPINEPHrine (EPIPEN 2-PAK) 0.3 mg/0.3 mL IJ SOAJ injection Inject 0.3 mLs (0.3 mg total) into the muscle once as needed for up to 1 dose for anaphylaxis.   fenofibrate micronized (LOFIBRA) 134 MG capsule TAKE 1 CAPSULE BY MOUTH THREE TIMES WEEKLY (Patient taking differently: Take 134 mg by mouth daily before breakfast.)   gabapentin (NEURONTIN) 600 MG tablet TAKE 1 TABLET (600 MG TOTAL) BY MOUTH 2 (TWO) TIMES DAILY. (Patient taking differently: Take 600 mg by mouth 2 (two) times daily.)   glimepiride (AMARYL) 4 MG tablet TAKE 1 TABLET BY MOUTH ONCE DAILY BEFORE BREAKFAST (Patient taking differently: Take 4 mg by mouth daily with breakfast.)   icosapent Ethyl (VASCEPA) 1 g capsule Take 2 capsules (2 g total) by mouth 2 (two) times daily.   Insulin Disposable Pump (OMNIPOD DASH PODS, GEN 4,) MISC CHANGE EVERY 72 HOURS AS DIRECTED (Patient taking differently: 1 each by Other route See admin instructions. Change every 72 hours)   insulin lispro (HUMALOG) 100 UNIT/ML injection Use pump to inject 50 units once daily (Patient taking differently: Inject 50 Units into the skin daily.)   isosorbide mononitrate (IMDUR) 30 MG 24 hr tablet TAKE 1  TABLET BY MOUTH ONCE DAILY (Patient taking differently: Take 30 mg by mouth daily.)   metFORMIN (GLUCOPHAGE) 500 MG tablet Take 2 tablets (1,000 mg total) by mouth 2 (two) times daily.   nitroGLYCERIN (NITROSTAT) 0.4 MG SL tablet PLACE 1 TABLETS UNDER THE TONGUE EVERY 5 MINS AS NEEDED FOR CHEST PAIN (Patient taking differently: Place 0.4 mg under the tongue every 5 (five) minutes as needed for chest pain.)   pantoprazole (PROTONIX) 40 MG tablet Take 1 tablet (40 mg total) by mouth daily.   Semaglutide, 2 MG/DOSE, (OZEMPIC, 2 MG/DOSE,) 8 MG/3ML SOPN Inject 2 mg into the skin weekly (Patient taking differently: Inject 2 mg into the skin once a week. Inject 2 mg into the skin weekly)     Allergies:   Bee venom, Hydrocodone bit-homatrop mbr, Toradol [ketorolac tromethamine], Sulfadiazine, Morphine and related, and Sulfa drugs cross reactors   Social History   Socioeconomic History   Marital status: Married    Spouse name: [name]   Number of children: 2   Years of education: Not on file   Highest education level: Not on file  Occupational History   Occupation: disabled   Tobacco Use   Smoking status: Never   Smokeless tobacco: Never  Vaping Use   Vaping Use: Never used  Substance and Sexual Activity   Alcohol use: No   Drug use: No   Sexual activity: Yes    Partners: Female  Other Topics Concern   Not on file  Social History Narrative   Household-- pt , wife, one adult son with Down's syndrome   younger son lives in [place] 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   Social Determinants of Health   Financial Resource Strain: Low Risk    Difficulty of Paying Living Expenses: Not hard at all  Food Insecurity: No Food Insecurity   Worried About Charity fundraiser in the Last Year: Never true   [place] in the Last Year: Never true  Transportation Needs: No Transportation Needs   Lack of Transportation (Medical): No   Lack of  Transportation (Non-Medical): No  Physical Activity: Inactive   Days of Exercise per Week: 0 days   Minutes of Exercise per Session: 0  min  Stress: No Stress Concern Present   Feeling of Stress : Not at all  Social Connections: Moderately Integrated   Frequency of Communication with Friends and Family: More than three times a week   Frequency of Social Gatherings with Friends and Family: Once a week   Attends Religious Services: Never   Marine scientist or Organizations: Yes   Attends Music therapist: 1 to 4 times per year   Marital Status: Married     Family History: The patient's family history includes Down syndrome in his son; Heart disease in his brother; Lung cancer in his father; Other in his brother and mother. There is no history of Diabetes, Prostate cancer, Colon cancer, Stomach cancer, Pancreatic cancer, or Liver disease. ROS:   Please see the history of present illness.    All 14 point review of systems negative except as described per history of present illness  EKGs/Labs/Other Studies Reviewed:      Recent Labs: 06/11/2020: TSH 1.57 12/25/2020: Hemoglobin 13.6; Platelets 137.0 03/04/2021: ALT 25; BUN 18; Creatinine, Ser 1.39; Potassium 4.3; Sodium 138  Recent Lipid Panel    Component Value Date/Time   CHOL 139 03/04/2021 1108   CHOL 152 10/30/2020 0920   TRIG 151.0 (H) 03/04/2021 1108   HDL 39.90 03/04/2021 1108   HDL 39 (L) 10/30/2020 0920   CHOLHDL 3 03/04/2021 1108   VLDL 30.2 03/04/2021 1108   LDLCALC 69 03/04/2021 1108   LDLCALC 68 10/30/2020 0920   LDLDIRECT 67.0 12/31/2020 0936    Physical Exam:    VS:  BP 98/60 (BP Location: Left Arm, Patient Position: Sitting)   Pulse 82   Ht 5' 11"  (1.803 m)   Wt 242 lb (109.8 kg)   SpO2 95%   BMI 33.75 kg/m     Wt Readings from Last 3 Encounters:  04/18/21 242 lb (109.8 kg)  03/20/21 244 lb (110.7 kg)  03/18/21 240 lb (108.9 kg)     GEN:  Well nourished, well developed in no acute  distress HEENT: Normal NECK: No JVD; No carotid bruits LYMPHATICS: No lymphadenopathy CARDIAC: RRR, no murmurs, no rubs, no gallops RESPIRATORY:  Clear to auscultation without rales, wheezing or rhonchi  ABDOMEN: Soft, non-tender, non-distended MUSCULOSKELETAL:  No edema; No deformity  SKIN: Warm and dry LOWER EXTREMITIES: no swelling NEUROLOGIC:  Alert and oriented x 3 PSYCHIATRIC:  Normal affect   ASSESSMENT:    1. Coronary artery disease involving native coronary artery of native heart without angina pectoris   2. Essential (primary) hypertension   3. Liver cirrhosis secondary to NASH (nonalcoholic steatohepatitis) (HCC)    PLAN:    In order of problems listed above:  Coronary disease stable from that point to an appropriate medication we will continue dual glucose therapy with this 1 year after implantation of a stent. Essential hypertension blood pressure seems to be well controlled continue present management. Nonalcoholic liver steatosis followed by antimedicine team Dyslipidemia I did review his K PN which show me LDL 69 HDL 39 this is from 03/04/2021 to good cholesterol profile Diabetes.  His last hemoglobin A1c 6.8 which is excellent.   Medication Adjustments/Labs and Tests Ordered: Current medicines are reviewed at length with the patient today.  Concerns regarding medicines are outlined above.  No orders of the defined types were placed in this encounter.  Medication changes: No orders of the defined types were placed in this encounter.   Signed, Park Liter, MD, [facility] 04/18/2021 11:41 AM  [facility]

## 2021-04-18 NOTE — Patient Instructions (Signed)

## 2021-04-21 ENCOUNTER — Ambulatory Visit: Payer: 59 | Attending: Family Medicine | Admitting: Pharmacist

## 2021-04-21 DIAGNOSIS — Z79899 Other long term (current) drug therapy: Secondary | ICD-10-CM

## 2021-04-21 NOTE — Progress Notes (Signed)
S: Patient has a follow up today for his specialty medication, Humira.   Patient is currently taking Humira for psoriatic arthritis. Patient is managed by Dr. Renda Rolls for this.   Adherence: denies any missed doses   Efficacy: reports that it is still working well for him.  Dosing: SubQ: 40 mg every other week  Drug-drug interactions:none  Screening: TB test: negative per patient  Hepatitis: completed prior to drug initiation   Monitoring: S/sx of infection: none currently CBC: last CBC (see below) S/sx of hypersensitivity: none S/sx of malignancy: none S/sx of heart failure: no diagnosis of HF    O:     Lab Results  Component Value Date   WBC 5.5 12/25/2020   HGB 13.6 12/25/2020   HCT 40.5 12/25/2020   MCV 91.4 12/25/2020   PLT 137.0 (L) 12/25/2020      Chemistry      Component Value Date/Time   NA 138 03/04/2021 1108   NA 139 09/16/2020 1724   K 4.3 03/04/2021 1108   CL 106 03/04/2021 1108   CO2 24 03/04/2021 1108   BUN 18 03/04/2021 1108   BUN 21 09/16/2020 1724   CREATININE 1.39 03/04/2021 1108   GLU 189 02/24/2019 0000      Component Value Date/Time   CALCIUM 9.3 03/04/2021 1108   ALKPHOS 67 03/04/2021 1108   AST 25 03/04/2021 1108   ALT 25 03/04/2021 1108   BILITOT 1.2 03/04/2021 1108       A/P: 1. Medication review: Patient on Humira for psoriatic arthritis and is tolerating it well with no adverse effects and reported control of psoriatic arthritis. Patient has been stable on medication for a while now. Patient denies any questions/concerns. No recommendations for any changes.   Benard Halsted, PharmD, [place], [place] [phone]

## 2021-04-29 ENCOUNTER — Ambulatory Visit (INDEPENDENT_AMBULATORY_CARE_PROVIDER_SITE_OTHER): Payer: 59 | Admitting: Neurology

## 2021-04-29 ENCOUNTER — Encounter: Payer: Self-pay | Admitting: Neurology

## 2021-04-29 VITALS — BP 134/82 | HR 72 | Ht 71.0 in | Wt 245.0 lb

## 2021-04-29 DIAGNOSIS — Z982 Presence of cerebrospinal fluid drainage device: Secondary | ICD-10-CM | POA: Diagnosis not present

## 2021-04-29 DIAGNOSIS — G43709 Chronic migraine without aura, not intractable, without status migrainosus: Secondary | ICD-10-CM | POA: Diagnosis not present

## 2021-04-29 DIAGNOSIS — Z6834 Body mass index (BMI) 34.0-34.9, adult: Secondary | ICD-10-CM

## 2021-04-29 DIAGNOSIS — G4752 REM sleep behavior disorder: Secondary | ICD-10-CM | POA: Diagnosis not present

## 2021-04-29 DIAGNOSIS — G4733 Obstructive sleep apnea (adult) (pediatric): Secondary | ICD-10-CM

## 2021-04-29 DIAGNOSIS — G911 Obstructive hydrocephalus: Secondary | ICD-10-CM

## 2021-04-29 DIAGNOSIS — G4700 Insomnia, unspecified: Secondary | ICD-10-CM | POA: Diagnosis not present

## 2021-04-29 DIAGNOSIS — I2510 Atherosclerotic heart disease of native coronary artery without angina pectoris: Secondary | ICD-10-CM

## 2021-04-29 NOTE — Progress Notes (Signed)
[facility]  PATIENT: James [name] DOB: [birth year]  REFERRING DOCTOR OR PCP:  Kathlene November  SOURCE: patient, notes from Dr. [name], imaging results and MRI and CT scans on PACS personally reviewed  _________________________________   HISTORICAL  CHIEF COMPLAINT:  Chief Complaint  Patient presents with   Procedure    Pt alone, rm 1. Here for botox. States things are well.     HISTORY OF PRESENT ILLNESS:  James [name] is a 65 y.o. man with idiopathic intracranial hypertension and recent increase in the frequency and severity of headache  Update 04/29/2021:  He feels the headaches have done well on Botox.  His last series is still working and he has had only a couple of headaches a week that are mild and easy to treat with Excedrin.  Before Botox he was having daily headaches that were more severe.  Neck pain is also doing better.    When headache occurs, pain is pressure, right greater than left.  They are associated with nausea at times.  When the headaches more severe he has photophobia and phonophobia. Sometimes, headaches improve when he is laying down but other times they do not change between sitting and lying down. Moving usually worsens the headaches.  He did not get a benefit from multiple prophylactic medications including Keppra, gabapentin, Emgality, Cymbalta.    He has obstructive sleep apnea but was unable to tolerate CPAP.  We discussed the inspire device and he is potentially interested but he would need to lose about 20 more pounds to get his BMI at 32 . He has excessive daytime sleepiness and nocturnal insomnia.    He has had some RBD with acticve dreams - with some kicking but not getting out of bed.    In early 2022, he had 3 cardiac stents after being diagnosed with CAD after presenting with chest pain.   He had a small MI.    Chronic Migraine History: He was having daily HA with migrainous features.   He did not get a benefit from multiple  prophylactic medications including Keppra, gabapentin, Emgality, Cymbalta.   Botox therapy started July 2021.  Spine: L-spine MRI 08/15/18 showed progressive epidural lipomatosis in the lower lumbar spine with progressive compression of the thecal sac and spinal stenosis at L3-4, L4-5, and L5-S1 compared to the prior study. Progressive facet degeneration and anterolisthesis at L4-5 since 2011.    MRI of the cervical spine performed 04/27/2017 showed  thoracic fusion hardware from C7 and below. There is mild spondylosis and disc bulging at C3-C4 through C6-C7. There does not appear to be any significant foraminal narrowing and there is no spinal stenosis.   Idiopthic intracranial Hypertension: He was diagnosed with idiopathic intracranial hypertension many years ago and had a VP shunt placed in 2007. A revision was performed July 2007 due to infection. He has a programmable Medtronic valve. Due to the increased headaches, it was reprogrammed from 1.5-1.0. Tapping of the shunt showed a pressure 160 (was drained to 140 mm). There was no infection. He also had an MRI of the brain and MR venogram. CT had shown the left-sided ventricular catheter extends into the right caudate head, similar to the previous study the MR venogram (11/12/2016) was reportedly normal. The left transverse and sigmoid sinuses are not well evaluated due to the shunt reservoir (but also likely he is right dominant).   CT head 3/13 2020 personally reviewed and no evidence of shunt failure  REVIEW OF SYSTEMS: Constitutional: No fevers, chills, sweats, or change in appetite Eyes: No visual changes, double vision, eye pain Ear, nose and throat: No hearing loss, ear pain, nasal congestion, sore throat Cardiovascular: No chest pain, palpitations Respiratory:  No shortness of breath at rest or with exertion.   No wheezes GastrointestinaI: No nausea, vomiting, diarrhea, abdominal pain, fecal incontinence Genitourinary:  No dysuria,  urinary retention or frequency.  No nocturia. Musculoskeletal:  as above Integumentary: psoriasis on Humira Neurological: as above Psychiatric: No depression at this time.  No anxiety Endocrine: No palpitations, diaphoresis, change in appetite, change in weigh or increased thirst Hematologic/Lymphatic:  No anemia, purpura, petechiae. Allergic/Immunologic: No itchy/runny eyes, nasal congestion, recent allergic reactions, rashes  ALLERGIES: Allergies  Allergen Reactions   Bee Venom Anaphylaxis   Hydrocodone Bit-Homatrop Mbr Other (See Comments)    Hallucinations, confusion, delirium Depressed feeling   Toradol [Ketorolac Tromethamine] Other (See Comments)    Hallucinations, confusion, delirium   Sulfadiazine     NDC UXNA:[id number] NDC YHCW:[id number] NDC OHYW:[id number]   Morphine And Related Other (See Comments)    Hallucinations, back in the 80s. States has taken vicodin before w/o problems    Sulfa Drugs Cross Reactors Rash    HOME MEDICATIONS:  Current Outpatient Medications:    Adalimumab (HUMIRA PEN) 40 MG/0.8ML PNKT, INJECT 40 MG (0.8 ML) UNDER THE SKIN EVERY OTHER WEEK. (Patient taking differently: Inject 40 mg into the skin every 14 (fourteen) days.), Disp: 2 each, Rfl: 0   amLODipine (NORVASC) 5 MG tablet, Take 1 tablet (5 mg total) by mouth daily., Disp: 90 tablet, Rfl: 2   aspirin EC 81 MG EC tablet, Take 1 tablet (81 mg total) by mouth daily. Swallow whole., Disp: 30 tablet, Rfl: 11   atorvastatin (LIPITOR) 80 MG tablet, Take 1 tablet (80 mg total) by mouth at bedtime., Disp: 90 tablet, Rfl: 1   benzonatate (TESSALON PERLES) 100 MG capsule, Take 1 capsule (100 mg total) by mouth 3 (three) times daily as needed for cough., Disp: 20 capsule, Rfl: 0   carvedilol (COREG) 12.5 MG tablet, TAKE 1 TABLET (12.5 MG TOTAL) BY MOUTH 2 (TWO) TIMES DAILY WITH A MEAL. (Patient taking differently: Take 12.5 mg by mouth 2 (two) times daily with a meal.), Disp: 180 tablet, Rfl: 1    clonazePAM (KLONOPIN) 0.5 MG tablet, TAKE 1 TABLET BY MOUTH EVERY NIGHT AT BEDTIME (Patient taking differently: Take 0.5 mg by mouth at bedtime.), Disp: 30 tablet, Rfl: 5   clopidogrel (PLAVIX) 75 MG tablet, TAKE 1 TABLET (75 MG TOTAL) BY MOUTH DAILY., Disp: 90 tablet, Rfl: 3   Continuous Blood Gluc Receiver (FREESTYLE LIBRE 2 READER) DEVI, Use to check blood sugar daily (Patient taking differently: 1 each by Other route See admin instructions. Use to check blood sugar daily), Disp: 1 each, Rfl: 0   Continuous Blood Gluc Sensor (FREESTYLE LIBRE 14 DAY SENSOR) MISC, Apply 1 sensor every 14 (fourteen) days. (Patient taking differently: 1 Units by Does not apply route every 14 (fourteen) days. Glucose check), Disp: 2 each, Rfl: 4   DULoxetine (CYMBALTA) 60 MG capsule, TAKE 2 CAPSULES (120 MG TOTAL) BY MOUTH DAILY. (Patient taking differently: Take 60 mg by mouth daily.), Disp: 180 capsule, Rfl: 1   EPINEPHrine (EPIPEN 2-PAK) 0.3 mg/0.3 mL IJ SOAJ injection, Inject 0.3 mLs (0.3 mg total) into the muscle once as needed for up to 1 dose for anaphylaxis., Disp: 2 each, Rfl: 2   fenofibrate micronized (LOFIBRA) 134 MG capsule, TAKE 1  CAPSULE BY MOUTH THREE TIMES WEEKLY (Patient taking differently: Take 134 mg by mouth daily before breakfast.), Disp: 15 capsule, Rfl: 3   gabapentin (NEURONTIN) 600 MG tablet, TAKE 1 TABLET (600 MG TOTAL) BY MOUTH 2 (TWO) TIMES DAILY. (Patient taking differently: Take 600 mg by mouth 2 (two) times daily.), Disp: 180 tablet, Rfl: 1   icosapent Ethyl (VASCEPA) 1 g capsule, Take 2 capsules (2 g total) by mouth 2 (two) times daily., Disp: 120 capsule, Rfl: 1   Insulin Disposable Pump (OMNIPOD DASH PODS, GEN 4,) MISC, CHANGE EVERY 72 HOURS AS DIRECTED (Patient taking differently: 1 each by Other route See admin instructions. Change every 72 hours), Disp: 10 each, Rfl: 1   insulin lispro (HUMALOG) 100 UNIT/ML injection, Use pump to inject 50 units once daily (Patient taking differently:  Inject 50 Units into the skin daily.), Disp: 50 mL, Rfl: 1   isosorbide mononitrate (IMDUR) 30 MG 24 hr tablet, TAKE 1 TABLET BY MOUTH ONCE DAILY (Patient taking differently: Take 30 mg by mouth daily.), Disp: 90 tablet, Rfl: 1   metFORMIN (GLUCOPHAGE) 500 MG tablet, Take 2 tablets (1,000 mg total) by mouth 2 (two) times daily., Disp: 120 tablet, Rfl: 3   nitroGLYCERIN (NITROSTAT) 0.4 MG SL tablet, PLACE 1 TABLETS UNDER THE TONGUE EVERY 5 MINS AS NEEDED FOR CHEST PAIN (Patient taking differently: Place 0.4 mg under the tongue every 5 (five) minutes as needed for chest pain.), Disp: 30 tablet, Rfl: 5   pantoprazole (PROTONIX) 40 MG tablet, Take 1 tablet (40 mg total) by mouth daily., Disp: 90 tablet, Rfl: 1   Semaglutide, 2 MG/DOSE, (OZEMPIC, 2 MG/DOSE,) 8 MG/3ML SOPN, Inject 2 mg into the skin weekly (Patient taking differently: Inject 2 mg into the skin once a week. Inject 2 mg into the skin weekly), Disp: 3 mL, Rfl: 2   glimepiride (AMARYL) 4 MG tablet, TAKE 1 TABLET BY MOUTH ONCE DAILY BEFORE BREAKFAST (Patient taking differently: Take 4 mg by mouth daily with breakfast.), Disp: 30 tablet, Rfl: 3  PAST MEDICAL HISTORY: Past Medical History:  Diagnosis Date   Abnormal cardiac CT angiography    Acid reflux    Annual physical exam 04/08/2015   Arthritis    Atypical chest pain 06/13/2020   Blood transfusion without reported diagnosis    Body mass index (BMI) 35.0-35.9, adult 04/05/2019   Chronic fatigue 01/28/2015   Chronic headaches    on cymbalta   Chronic migraine w/o aura, not intractable, w/o stat migr 10/24/2018   Cirrhosis ([place])    Colon polyps    Complication of anesthesia    problems waking up from anesthesia   Coronary artery disease 01/23/2019   Depression    on cymbalta   Diabetes mellitus with neuropathy ([place])    Diabetes with neuropathy 04/25/2013   Diverticulitis 03/2013   Dyslipidemia 05/29/2019   Eczema    Elevated LFTs    Epidural lipomatosis 10/05/2018   Essential  (primary) hypertension 04/05/2019   Essential hypertension 10/10/2019   Fatty liver    GERD (gastroesophageal reflux disease) 04/28/2011   H/O craniotomy 05/07/2015   Headache 04/28/2011   Hepatitis 10/2017   NASH cirrhosis   History of kidney stones    Hyperlipidemia    Hypersomnia with sleep apnea 01/28/2015   Hypertension    IDA (iron deficiency anemia) 01/24/2019   Idiopathic intracranial hypertension 01/14/2017   Insomnia 04/26/2013   Kidney stone    Liver cirrhosis secondary to NASH (nonalcoholic steatohepatitis) ([place]) 01/02/2016   Low  back pain 04/05/2019   Lower back injury 08/14/2019   Morbid obesity ([place])    Neuromuscular disorder ([place])    neuropathy   Neuropathy    Nonalcoholic steatohepatitis 02/03/1102   Obstructive hydrocephalus ([place]) 01/28/2015   OSA -- dx ~ 2012, cpap intolerant 09/04/2014    dx ~ 2012, cpap intolerant    PCP NOTES >>> 04/08/2015   Post-op pain 03/19/2019   Post-traumatic hydrocephalus ([place])    s/p shunts x 2 (first got infected )   Presence of cerebrospinal fluid drainage device 07/28/2011   Psoriasis    sees Dr [name]   Psoriatic arthritis ([place])    REM behavioral disorder 01/14/2017   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   see's Dr.Ortmann   Severe obesity (BMI >= 40) ([place]) 01/28/2015   SI (sacroiliac) joint dysfunction 08/14/2019   Sigmoid diverticulitis 04/25/2013   Sleep apnea    no CPAP      Spondylolisthesis, lumbar region 03/16/2019   Stomach ulcer    Testosterone deficiency 04/28/2011   VP (ventriculoperitoneal) shunt status 07/31/2020    PAST SURGICAL HISTORY: Past Surgical History:  Procedure Laterality Date   [place]     VP shunts placed in 2007   CHOLECYSTECTOMY N/A 08/25/2017   Procedure: LAPAROSCOPIC CHOLECYSTECTOMY WITH INTRAOPERATIVE CHOLANGIOGRAM;  Surgeon: [name], MD;  Location: [facility];  Service: General;  Laterality: N/A;   COLONOSCOPY     CORONARY STENT INTERVENTION N/A 09/27/2020    Procedure: CORONARY STENT INTERVENTION;  Surgeon: Jettie Booze, MD;  Location: [facility];  Service: Cardiovascular;  Laterality: N/A;   INTRAVASCULAR ULTRASOUND/IVUS N/A 09/27/2020   Procedure: Intravascular Ultrasound/IVUS;  Surgeon: Jettie Booze, MD;  Location: [facility];  Service: Cardiovascular;  Laterality: N/A;   JOINT REPLACEMENT     total hip   LEFT HEART CATH N/A 09/27/2020   Procedure: Left Heart Cath;  Surgeon: Jettie Booze, MD;  Location: [facility];  Service: Cardiovascular;  Laterality: N/A;   LEFT HEART CATH AND CORONARY ANGIOGRAPHY N/A 09/24/2020   Procedure: LEFT HEART CATH AND CORONARY ANGIOGRAPHY;  Surgeon: Jettie Booze, MD;  Location: [facility];  Service: Cardiovascular;  Laterality: N/A;   LUMBAR FUSION  03/16/2019   SHOULDER SURGERY Left 2010   TOE SURGERY Left 2018   TONSILLECTOMY     as a child   TOTAL HIP ARTHROPLASTY Left 2011   UPPER GASTROINTESTINAL ENDOSCOPY  01/04/2020   VENTRICULOPERITONEAL SHUNT  2007   x2    FAMILY HISTORY: Family History  Problem Relation Age of Onset   Other Mother    Lung cancer Father        alive, former smoker    Heart disease Brother        MI age 53   Other Brother        Murdered   Down syndrome Son    Diabetes Neg Hx    Prostate cancer Neg Hx    Colon cancer Neg Hx    Stomach cancer Neg Hx    Pancreatic cancer Neg Hx    Liver disease Neg Hx     SOCIAL HISTORY:  Social History   Socioeconomic History   Marital status: Married    Spouse name: [name]   Number of children: 2   Years of education: Not on file   Highest education level: Not on file  Occupational History   Occupation: disabled  Tobacco Use   Smoking status: Never   Smokeless tobacco: Never  Vaping Use   Vaping Use: Never used  Substance and Sexual Activity   Alcohol use: No   Drug use: No   Sexual activity: Yes    Partners: Female  Other Topics Concern   Not on file  Social  History Narrative   Household-- pt , wife, one adult son with Down's syndrome   younger son lives in [place] 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   Social Determinants of Health   Financial Resource Strain: Low Risk    Difficulty of Paying Living Expenses: Not hard at all  Food Insecurity: No Food Insecurity   Worried About Charity fundraiser in the Last Year: Never true   [place] in the Last Year: Never true  Transportation Needs: No Transportation Needs   Lack of Transportation (Medical): No   Lack of Transportation (Non-Medical): No  Physical Activity: Inactive   Days of Exercise per Week: 0 days   Minutes of Exercise per Session: 0 min  Stress: No Stress Concern Present   Feeling of Stress : Not at all  Social Connections: Moderately Integrated   Frequency of Communication with Friends and Family: More than three times a week   Frequency of Social Gatherings with Friends and Family: Once a week   Attends Religious Services: Never   Marine scientist or Organizations: Yes   Attends Music therapist: 1 to 4 times per year   Marital Status: Married  Human resources officer Violence: Not At Risk   Fear of Current or Ex-Partner: No   Emotionally Abused: No   Physically Abused: No   Sexually Abused: No     PHYSICAL EXAM  Vitals:   04/29/21 1039  BP: 134/82  Pulse: 72  Weight: 245 lb (111.1 kg)  Height: 5' 11"  (1.803 m)     Body mass index is 34.17 kg/m.   General: The patient is well-developed and well-nourished and in no acute distress,     Neck: There is mild tenderness at the occiput bilaterally.    Skin: Extremities are without significant edema.  Neurologic Exam  Mental status: The patient is alert and oriented x 3 at the time of the examination. The patient has apparent normal recent and remote memory, with an apparently normal attention span and concentration ability.   Speech is  normal.  Cranial nerves: Extraocular movements are full.  Facial strength and sensation is normal. OK neck strength  Motor:  Muscle bulk is normal.   Tone is normal. Strength is  5 / 5 in the arms and proximal legs and 4+/5 in the intrinsic foot muscles in both feet.   Coordination: Cerebellar testing shows good finger-nose-finger  Gait and station: Station is normal.  The gait is arthritic.  Tandem gait is wide.  Romberg is negative.  Reflexes: Deep tendon reflexes are 1 and symmetric in the arms, 2 and symmetric at the knees and absent at the ankles.Marland Kitchen       DIAGNOSTIC DATA (LABS, IMAGING, TESTING) - I reviewed patient records, labs, notes, testing and imaging myself where available.  Lab Results  Component Value Date   WBC 5.5 12/25/2020   HGB 13.6 12/25/2020   HCT 40.5 12/25/2020   MCV 91.4 12/25/2020   PLT 137.0 (L) 12/25/2020      Component Value Date/Time   NA 138 03/04/2021 1108   NA  139 09/16/2020 1724   K 4.3 03/04/2021 1108   CL 106 03/04/2021 1108   CO2 24 03/04/2021 1108   GLUCOSE 159 (H) 03/04/2021 1108   BUN 18 03/04/2021 1108   BUN 21 09/16/2020 1724   CREATININE 1.39 03/04/2021 1108   CALCIUM 9.3 03/04/2021 1108   PROT 7.4 03/04/2021 1108   ALBUMIN 3.9 03/04/2021 1108   AST 25 03/04/2021 1108   ALT 25 03/04/2021 1108   ALKPHOS 67 03/04/2021 1108   BILITOT 1.2 03/04/2021 1108   GFRNONAA 52 (L) 09/28/2020 0254   GFRAA 66 08/08/2020 1442   Lab Results  Component Value Date   CHOL 139 03/04/2021   HDL 39.90 03/04/2021   LDLCALC 69 03/04/2021   LDLDIRECT 67.0 12/31/2020   TRIG 151.0 (H) 03/04/2021   CHOLHDL 3 03/04/2021   Lab Results  Component Value Date   HGBA1C 6.8 (H) 03/04/2021   Lab Results  Component Value Date   VITAMINB12 490 06/10/2018   Lab Results  Component Value Date   TSH 1.57 06/11/2020       ASSESSMENT AND PLAN  Chronic migraine w/o aura, not intractable, w/o stat migr  OSA -- dx ~ 2012, cpap intolerant  REM  behavioral disorder  Obstructive hydrocephalus (HCC)  Presence of cerebrospinal fluid drainage device  Insomnia, unspecified type  BMI 34.0-34.9,adult  1.  Botox 155 units as follows: Frontalis muscle (5 units x 4), corrugators and procerus (5 units x 3), temporalis (5 units x 8), occipitalis (5 units x 4), splenius capitis (12.5 units x 2), splenius cervicis (10 units x 2), C6-C7 paraspinal (5 units x 2), trapezius (12.5 units x 2) 2.   He has OSA and is unable to tolerate CPAP.  He is potentially interested in the inspire device but his BMI is too high at this time.  We discussed losing further weight to try to get down to a BMI below 32.    3.   Continue clonazepam 0.5 mg for RBD and insomnia 4.   rtc 6 months, sooner if new or worsening issues.  Maahir Horst A. Felecia Shelling, MD, [facility] 82/02/9370, 69:67 AM Certified in Neurology, Clinical Neurophysiology, Sleep Medicine, Pain Medicine and Neuroimaging  [facility] [address], [place], [place] [address] [phone]

## 2021-05-02 ENCOUNTER — Other Ambulatory Visit (HOSPITAL_COMMUNITY): Payer: Self-pay

## 2021-05-05 ENCOUNTER — Other Ambulatory Visit (HOSPITAL_COMMUNITY): Payer: Self-pay

## 2021-05-07 ENCOUNTER — Other Ambulatory Visit (HOSPITAL_BASED_OUTPATIENT_CLINIC_OR_DEPARTMENT_OTHER): Payer: Self-pay

## 2021-05-12 ENCOUNTER — Other Ambulatory Visit (HOSPITAL_COMMUNITY): Payer: Self-pay

## 2021-05-12 ENCOUNTER — Other Ambulatory Visit: Payer: Self-pay | Admitting: Pharmacist

## 2021-05-12 MED ORDER — HUMIRA PEN 40 MG/0.8ML ~~LOC~~ PNKT
PEN_INJECTOR | SUBCUTANEOUS | 0 refills | Status: DC
  Filled 2021-05-12: qty 2, 28d supply, fill #0

## 2021-05-13 ENCOUNTER — Other Ambulatory Visit (HOSPITAL_COMMUNITY): Payer: Self-pay

## 2021-05-15 DIAGNOSIS — M5441 Lumbago with sciatica, right side: Secondary | ICD-10-CM | POA: Diagnosis not present

## 2021-05-15 DIAGNOSIS — G8929 Other chronic pain: Secondary | ICD-10-CM | POA: Diagnosis not present

## 2021-05-15 DIAGNOSIS — I10 Essential (primary) hypertension: Secondary | ICD-10-CM | POA: Diagnosis not present

## 2021-05-15 DIAGNOSIS — Z6833 Body mass index (BMI) 33.0-33.9, adult: Secondary | ICD-10-CM | POA: Diagnosis not present

## 2021-05-15 DIAGNOSIS — M5442 Lumbago with sciatica, left side: Secondary | ICD-10-CM | POA: Diagnosis not present

## 2021-05-15 DIAGNOSIS — M25552 Pain in left hip: Secondary | ICD-10-CM | POA: Diagnosis not present

## 2021-05-15 DIAGNOSIS — M25561 Pain in right knee: Secondary | ICD-10-CM | POA: Diagnosis not present

## 2021-05-16 ENCOUNTER — Other Ambulatory Visit: Payer: Self-pay

## 2021-05-16 ENCOUNTER — Other Ambulatory Visit (INDEPENDENT_AMBULATORY_CARE_PROVIDER_SITE_OTHER): Payer: 59

## 2021-05-16 DIAGNOSIS — E1165 Type 2 diabetes mellitus with hyperglycemia: Secondary | ICD-10-CM | POA: Diagnosis not present

## 2021-05-16 DIAGNOSIS — Z794 Long term (current) use of insulin: Secondary | ICD-10-CM

## 2021-05-16 LAB — BASIC METABOLIC PANEL
BUN: 21 mg/dL (ref 6–23)
CO2: 26 mEq/L (ref 19–32)
Calcium: 10 mg/dL (ref 8.4–10.5)
Chloride: 102 mEq/L (ref 96–112)
Creatinine, Ser: 1.53 mg/dL — ABNORMAL HIGH (ref 0.40–1.50)
GFR: 48.19 mL/min — ABNORMAL LOW (ref >60.00)
Glucose, Bld: 137 mg/dL — ABNORMAL HIGH (ref 70–99)
Potassium: 4.3 mEq/L (ref 3.5–5.1)
Sodium: 138 mEq/L (ref 135–145)

## 2021-05-17 LAB — FRUCTOSAMINE: Fructosamine: 305 umol/L — ABNORMAL HIGH (ref 0–285)

## 2021-05-19 ENCOUNTER — Other Ambulatory Visit (HOSPITAL_BASED_OUTPATIENT_CLINIC_OR_DEPARTMENT_OTHER): Payer: Self-pay

## 2021-05-20 ENCOUNTER — Encounter: Payer: Self-pay | Admitting: Endocrinology

## 2021-05-20 ENCOUNTER — Other Ambulatory Visit: Payer: Self-pay

## 2021-05-20 ENCOUNTER — Ambulatory Visit (INDEPENDENT_AMBULATORY_CARE_PROVIDER_SITE_OTHER): Payer: 59 | Admitting: Endocrinology

## 2021-05-20 ENCOUNTER — Ambulatory Visit: Payer: 59 | Attending: Internal Medicine

## 2021-05-20 VITALS — BP 120/88 | HR 83 | Ht 71.0 in | Wt 239.8 lb

## 2021-05-20 DIAGNOSIS — E1165 Type 2 diabetes mellitus with hyperglycemia: Secondary | ICD-10-CM | POA: Diagnosis not present

## 2021-05-20 DIAGNOSIS — I2510 Atherosclerotic heart disease of native coronary artery without angina pectoris: Secondary | ICD-10-CM

## 2021-05-20 DIAGNOSIS — Z23 Encounter for immunization: Secondary | ICD-10-CM

## 2021-05-20 DIAGNOSIS — N289 Disorder of kidney and ureter, unspecified: Secondary | ICD-10-CM

## 2021-05-20 NOTE — Progress Notes (Signed)
Patient ID: James [name], male   DOB: 26-Sep-1955, 65 y.o.   MRN: [id number]           Reason for Appointment: Follow-up for Type 2 Diabetes   History of Present Illness:          Date of diagnosis of type 2 diabetes mellitus:  ?  2014      Background history:  He is not clear how his diabetes was diagnosed, likely on routine testing. Initially had been treated with metformin and also tried on Amaryl Patient had progressive increase in his blood sugars since 1/17 with stopping metformin and being on the regimen of Amaryl and Januvia, he thinks his blood sugars went up to 601.  He was then started on basal bolus insulin   Lowest A1c was 6.8 previously  OMNIPOD insulin pump settings as follows Basal rates: 12 AM-6 AM = 0.1, 6 AM-11 AM = 0.5, 11 AM- 9:30 PM = 1.0 and 9:30 PM-12 AM = 1.2 Correction 1: 50 carbohydrate coverage 1: 1   Recent history:   INSULIN: None currently Non-insulin hypoglycemic drugs the patient is taking are:  metformin 1000 mg twice daily, Ozempic 1.0 mg weekly   His A1c is 6.8 last Fructosamine as high as 348 and now 305  Current management, blood sugar patterns and problems identified:  He has not been using his insulin pump he still has relatively better blood sugars likely has not used his pump for at least 2 months He is however not checking his blood sugars consistently with his freestyle libre especially in the evenings For this reason difficult to assess his POSTPRANDIAL blood sugars Although he thinks he is eating mostly 1 meal a day in the evening he does have some spikes in his blood sugars midday  This may be related to either eating some carbohydrates or drinking with occasional regular soft drinks  He is not active because of his back pain recently However he appears to be gradually losing weight consistently for various reasons  He says he had nausea with the 2 mg OZEMPIC and went back to 1 mg  However he still appears to be unable  to keep his overall calorie intake restricted and prevent any weight gain He is not using any over-the-counter keto tablets as he was doing before  Currently still using the freestyle libre version #1 since he cannot get the app for the [place] 2 or 3  OVERNIGHT blood sugars are still fairly good Is now taking 1 tablet of metformin with his afternoon meal and another tablet at bedtime  The dose was increased when his creatinine was normal on the last visit but this is higher now  Blood sugar patterns from his Elenor Legato are as follows  Blood sugars are fairly stable and shows very little variability overnight with readings averaging between 116 and 135 HYPERGLYCEMIC episodes are occurring sporadically midday and afternoons However blood sugar data is incomplete on several days after about 8-9 PM No hypoglycemia Postprandial readings are occasionally higher around lunchtime but only occasionally seem to be higher after evening meal but not spiking excessively Average blood sugar at the highest is only 159 between 12-2 PM  CGM use % of time 51  2-week average/GV 138  Time in range 89%  % Time Above 180 10+1  % Time above 250   % Time Below 70      PRE-MEAL Fasting Lunch Dinner Bedtime Overall  Glucose range:  Averages: 120       POST-MEAL PC Breakfast PC Lunch PC Dinner  Glucose range:     Averages:  159 154    Previously   Side  effects from medications have been: None  Compliance with the medical regimen: Fair  Glucose monitoring:   Glucometer:  Freestyle libre 14-day  Recent CGM data interpretation as follows from 8/15 through 8/28  Blood sugar monitoring is significantly more complete compared to the last time however he has been out of his sensor for the last 3 weeks HYPERGLYCEMIA occurs only late at night after about 10 PM with blood sugar ranging generally between 160-200 Postprandial hyperglycemia during the day as only occasional late morning or afternoon However  overall rise in blood sugar compared to P[place] readings is not excessive Blood sugars are excellent early morning and averaging in the 120s between 4-8 AM No hypoglycemia In the first week of the recording he was having somewhat more high readings after meals including breakfast Overall blood sugars are lower compared to last visit when he was only having 56% within target range  CGM use % of time 70  2-week average/GV 150/24  Time in range     81   %  % Time Above 180 18+1  % Time above 250   % Time Below 70      PRE-MEAL Fasting Lunch Dinner Bedtime Overall  Glucose range:       Averages: 122 161  194    POST-MEAL PC Breakfast PC Lunch PC Dinner  Glucose range:     Averages: 167 161 159      Self-care:   Meal times are:  Breakfast variable, lunch: 12-3 PM Dinner: 6 PM    Dietician visit, most recent:09/2015               CDE consultation: 12/2018  Weight history:  Wt Readings from Last 3 Encounters:  05/20/21 239 lb 12.8 oz (108.8 kg)  04/29/21 245 lb (111.1 kg)  04/18/21 242 lb (109.8 kg)    Glycemic control:   Lab Results  Component Value Date   HGBA1C 6.8 (H) 03/04/2021   HGBA1C 6.8 (H) 10/24/2020   HGBA1C 6.8 (H) 09/27/2020   Lab Results  Component Value Date   MICROALBUR 5.5 (H) 03/04/2021   LDLCALC 69 03/04/2021   CREATININE 1.53 (H) 05/16/2021    Lab on 05/16/2021  Component Date Value Ref Range Status   Fructosamine 05/16/2021 305 (H)  0 - 285 umol/L Final   Comment: Published reference interval for apparently healthy subjects between age 93 and 6 is 4 - 285 umol/L and in a poorly controlled diabetic population is 228 - 563 umol/L with a mean of 396 umol/L.    Sodium 05/16/2021 138  135 - 145 mEq/L Final   Potassium 05/16/2021 4.3  3.5 - 5.1 mEq/L Final   Chloride 05/16/2021 102  96 - 112 mEq/L Final   CO2 05/16/2021 26  19 - 32 mEq/L Final   Glucose, Bld 05/16/2021 137 (H)  70 - 99 mg/dL Final   BUN 05/16/2021 21  6 - 23 mg/dL Final    Creatinine, Ser 05/16/2021 1.53 (H)  0.40 - 1.50 mg/dL Final   GFR 05/16/2021 48.19 (L)  >60.00 mL/min Final   Calculated using the CKD-EPI Creatinine Equation (2021)   Calcium 05/16/2021 10.0  8.4 - 10.5 mg/dL Final       Allergies as of 05/20/2021       Reactions  Bee Venom Anaphylaxis   Hydrocodone Bit-homatrop Mbr Other (See Comments)   Hallucinations, confusion, delirium Depressed feeling   Toradol [ketorolac Tromethamine] Other (See Comments)   Hallucinations, confusion, delirium   Sulfadiazine    NDC BWIO:[id number] NDC GTXM:[id number] NDC GNOI:[id number]   Morphine And Related Other (See Comments)   Hallucinations, back in the 80s. States has taken vicodin before w/o problems    Sulfa Drugs Cross Reactors Rash        Medication List        Accurate as of May 20, 2021 11:08 AM. If you have any questions, ask your nurse or doctor.          amLODipine 5 MG tablet Commonly known as: NORVASC Take 1 tablet (5 mg total) by mouth daily.   aspirin 81 MG EC tablet Take 1 tablet (81 mg total) by mouth daily. Swallow whole.   atorvastatin 80 MG tablet Commonly known as: LIPITOR Take 1 tablet (80 mg total) by mouth at bedtime.   benzonatate 100 MG capsule Commonly known as: Tessalon Perles Take 1 capsule (100 mg total) by mouth 3 (three) times daily as needed for cough.   carvedilol 12.5 MG tablet Commonly known as: COREG TAKE 1 TABLET (12.5 MG TOTAL) BY MOUTH 2 (TWO) TIMES DAILY WITH A MEAL. What changed: how much to take   clonazePAM 0.5 MG tablet Commonly known as: KLONOPIN TAKE 1 TABLET BY MOUTH EVERY NIGHT AT BEDTIME What changed: how much to take   clopidogrel 75 MG tablet Commonly known as: PLAVIX TAKE 1 TABLET (75 MG TOTAL) BY MOUTH DAILY.   DULoxetine 60 MG capsule Commonly known as: CYMBALTA TAKE 2 CAPSULES (120 MG TOTAL) BY MOUTH DAILY. What changed:  how much to take how to take this when to take this   EPINEPHrine 0.3 mg/0.3  mL Soaj injection Commonly known as: EpiPen 2-Pak Inject 0.3 mLs (0.3 mg total) into the muscle once as needed for up to 1 dose for anaphylaxis.   fenofibrate micronized 134 MG capsule Commonly known as: LOFIBRA TAKE 1 CAPSULE BY MOUTH THREE TIMES WEEKLY What changed:  how much to take how to take this when to take this   FreeStyle Libre 14 Day Sensor Misc Apply 1 sensor every 14 (fourteen) days. What changed: additional instructions   FreeStyle Libre 2 Reader Devi Use to check blood sugar daily What changed:  how much to take how to take this when to take this   gabapentin 600 MG tablet Commonly known as: NEURONTIN TAKE 1 TABLET (600 MG TOTAL) BY MOUTH 2 (TWO) TIMES DAILY. What changed:  how much to take how to take this when to take this   glimepiride 4 MG tablet Commonly known as: AMARYL TAKE 1 TABLET BY MOUTH ONCE DAILY BEFORE BREAKFAST What changed:  how much to take when to take this   HumaLOG 100 UNIT/ML injection Generic drug: insulin lispro Use pump to inject 50 units once daily What changed:  how much to take when to take this   Humira Pen 40 MG/0.8ML Pnkt Generic drug: Adalimumab INJECT 40 MG (0.8 ML) UNDER THE SKIN EVERY OTHER WEEK.   isosorbide mononitrate 30 MG 24 hr tablet Commonly known as: IMDUR TAKE 1 TABLET BY MOUTH ONCE DAILY What changed: how much to take   metFORMIN 500 MG tablet Commonly known as: GLUCOPHAGE Take 2 tablets (1,000 mg total) by mouth 2 (two) times daily.   nitroGLYCERIN 0.4 MG SL tablet Commonly known as: NITROSTAT PLACE 1 TABLETS  UNDER THE TONGUE EVERY 5 MINS AS NEEDED FOR CHEST PAIN What changed:  how much to take how to take this when to take this reasons to take this   Omnipod 5 G6 Pod (Gen 5) Misc CHANGE EVERY 72 HOURS AS DIRECTED What changed:  how much to take how to take this when to take this additional instructions   Ozempic (2 MG/DOSE) 8 MG/3ML Sopn Generic drug: Semaglutide (2  MG/DOSE) Inject 2 mg into the skin weekly What changed:  how much to take how to take this when to take this   pantoprazole 40 MG tablet Commonly known as: PROTONIX Take 1 tablet (40 mg total) by mouth daily.   Vascepa 1 g capsule Generic drug: icosapent Ethyl Take 2 capsules (2 g total) by mouth 2 (two) times daily.        Allergies:  Allergies  Allergen Reactions   Bee Venom Anaphylaxis   Hydrocodone Bit-Homatrop Mbr Other (See Comments)    Hallucinations, confusion, delirium Depressed feeling   Toradol [Ketorolac Tromethamine] Other (See Comments)    Hallucinations, confusion, delirium   Sulfadiazine     NDC MGQQ:[id number] NDC TIWP:[id number] NDC NLZJ:[id number]   Morphine And Related Other (See Comments)    Hallucinations, back in the 80s. States has taken vicodin before w/o problems    Sulfa Drugs Cross Reactors Rash    Past Medical History:  Diagnosis Date   Abnormal cardiac CT angiography    Acid reflux    Annual physical exam 04/08/2015   Arthritis    Atypical chest pain 06/13/2020   Blood transfusion without reported diagnosis    Body mass index (BMI) 35.0-35.9, adult 04/05/2019   Chronic fatigue 01/28/2015   Chronic headaches    on cymbalta   Chronic migraine w/o aura, not intractable, w/o stat migr 10/24/2018   Cirrhosis ([place])    Colon polyps    Complication of anesthesia    problems waking up from anesthesia   Coronary artery disease 01/23/2019   Depression    on cymbalta   Diabetes mellitus with neuropathy ([place])    Diabetes with neuropathy 04/25/2013   Diverticulitis 03/2013   Dyslipidemia 05/29/2019   Eczema    Elevated LFTs    Epidural lipomatosis 10/05/2018   Essential (primary) hypertension 04/05/2019   Essential hypertension 10/10/2019   Fatty liver    GERD (gastroesophageal reflux disease) 04/28/2011   H/O craniotomy 05/07/2015   Headache 04/28/2011   Hepatitis 10/2017   NASH cirrhosis   History of kidney stones    Hyperlipidemia     Hypersomnia with sleep apnea 01/28/2015   Hypertension    IDA (iron deficiency anemia) 01/24/2019   Idiopathic intracranial hypertension 01/14/2017   Insomnia 04/26/2013   Kidney stone    Liver cirrhosis secondary to NASH (nonalcoholic steatohepatitis) ([place]) 01/02/2016   Low back pain 04/05/2019   Lower back injury 08/14/2019   Morbid obesity ([place])    Neuromuscular disorder ([place])    neuropathy   Neuropathy    Nonalcoholic steatohepatitis 4/0/9735   Obstructive hydrocephalus ([place]) 01/28/2015   OSA -- dx ~ 2012, cpap intolerant 09/04/2014    dx ~ 2012, cpap intolerant    PCP NOTES >>> 04/08/2015   Post-op pain 03/19/2019   Post-traumatic hydrocephalus (HCC)    s/p shunts x 2 (first got infected )   Presence of cerebrospinal fluid drainage device 07/28/2011   Psoriasis    sees Dr [name]   Psoriatic arthritis ([place])    REM behavioral disorder 01/14/2017  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   see's Dr.Ortmann   Severe obesity (BMI >= 40) (HCC) 01/28/2015   SI (sacroiliac) joint dysfunction 08/14/2019   Sigmoid diverticulitis 04/25/2013   Sleep apnea    no CPAP      Spondylolisthesis, lumbar region 03/16/2019   Stomach ulcer    Testosterone deficiency 04/28/2011   VP (ventriculoperitoneal) shunt status 07/31/2020    Past Surgical History:  Procedure Laterality Date   [place]     VP shunts placed in 2007   CHOLECYSTECTOMY N/A 08/25/2017   Procedure: LAPAROSCOPIC CHOLECYSTECTOMY WITH INTRAOPERATIVE CHOLANGIOGRAM;  Surgeon: [name], MD;  Location: [facility];  Service: General;  Laterality: N/A;   COLONOSCOPY     CORONARY STENT INTERVENTION N/A 09/27/2020   Procedure: CORONARY STENT INTERVENTION;  Surgeon: Jettie Booze, MD;  Location: [place];  Service: Cardiovascular;  Laterality: N/A;   INTRAVASCULAR ULTRASOUND/IVUS N/A 09/27/2020   Procedure: Intravascular Ultrasound/IVUS;  Surgeon: Jettie Booze, MD;  Location: [place]  [facility];  Service: Cardiovascular;  Laterality: N/A;   JOINT REPLACEMENT     total hip   LEFT HEART CATH N/A 09/27/2020   Procedure: Left Heart Cath;  Surgeon: Jettie Booze, MD;  Location: [place] [facility];  Service: Cardiovascular;  Laterality: N/A;   LEFT HEART CATH AND CORONARY ANGIOGRAPHY N/A 09/24/2020   Procedure: LEFT HEART CATH AND CORONARY ANGIOGRAPHY;  Surgeon: Jettie Booze, MD;  Location: [facility] [facility];  Service: Cardiovascular;  Laterality: N/A;   LUMBAR FUSION  03/16/2019   SHOULDER SURGERY Left 2010   TOE SURGERY Left 2018   TONSILLECTOMY     as a child   TOTAL HIP ARTHROPLASTY Left 2011   UPPER GASTROINTESTINAL ENDOSCOPY  01/04/2020   VENTRICULOPERITONEAL SHUNT  2007   x2    Family History  Problem Relation Age of Onset   Other Mother    Lung cancer Father        alive, former smoker    Heart disease Brother        MI age 50   Other Brother        Murdered   Down syndrome Son    Diabetes Neg Hx    Prostate cancer Neg Hx    Colon cancer Neg Hx    Stomach cancer Neg Hx    Pancreatic cancer Neg Hx    Liver disease Neg Hx     Social History:  reports that he has never smoked. He has never used smokeless tobacco. He reports that he does not drink alcohol and does not use drugs.    Review of Systems      HYPERTENSION: On carvedilol with good control Hypertension managed by his PCP   BP Readings from Last 3 Encounters:  05/20/21 120/88  04/29/21 134/82  04/18/21 98/60   RENAL dysfunction: Has had stable creatinine levels, now appearing to be more normal No history of microalbuminuria  Lab Results  Component Value Date   CREATININE 1.53 (H) 05/16/2021   CREATININE 1.39 03/04/2021   CREATININE 1.55 (H) 12/31/2020    Lipid history: He is on Lipitor  20 mg  Is on fenofibrate 3 times a week for high triglycerides, Was given Vascepa also but he says it caused headaches with the full doses and has been asked to try 1 g twice  daily  LDL is below 70 and triglycerides are improving   Lab Results  Component Value Date   CHOL 139 03/04/2021   CHOL 145 12/31/2020   CHOL 152 10/30/2020   Lab Results  Component Value Date   HDL 39.90 03/04/2021   HDL 37.30 (L) 12/31/2020   HDL 39 (L) 10/30/2020   Lab Results  Component Value Date   LDLCALC 69 03/04/2021   LDLCALC 68 10/30/2020   LDLCALC 106 (H) 04/28/2019   Lab Results  Component Value Date   TRIG 151.0 (H) 03/04/2021   TRIG 298.0 (H) 12/31/2020   TRIG 278 (H) 10/30/2020   Lab Results  Component Value Date   CHOLHDL 3 03/04/2021   CHOLHDL 4 12/31/2020   CHOLHDL 3.9 10/30/2020   Lab Results  Component Value Date   LDLDIRECT 67.0 12/31/2020   LDLDIRECT 91.0 06/11/2020   LDLDIRECT 83.0 10/09/2019            He is being followed by gastroenterology for liver cirrhosis secondary to [facility]  Lab Results  Component Value Date   ALT 25 03/04/2021     He has symptomatic painful neuropathy and is on gabapentin 600 mg twice a day and  Cymbalta 60 mg  Has been followed by neurologist    Review of Systems    Physical Examination:  BP 120/88   Pulse 83   Ht 5' 11"  (1.803 m)   Wt 239 lb 12.8 oz (108.8 kg)   SpO2 98%   BMI 33.45 kg/m       ASSESSMENT:  Diabetes type 2 with obesity  See history of present illness for discussion of current diabetes management, blood sugar patterns and problems identified  Currently on a regimen of, Ozempic 1 mg and Metformin 1000 mg twice daily  A1c 6.8 most recently but fructosamine is relatively better at 305  He has a little better control possibly from increasing metformin Also may be doing a little better with his diet Has had a little weight loss again even without any exercise  Although his monitoring is incomplete his time in target is 89% from his Elenor Legato, this likely is fairly accurate since his lab glucose was 137 similar to the sensor reading and his fasting readings otherwise are 140 or  less  Although he may benefit from maximum dose Ozempic he cannot apparently tolerate this  RENAL dysfunction: This is little worse now and may be from taking some Aleve at times  PLAN: He needs to start checking his blood sugars consistently after his main meal in the evening to help adjust his diet a little better Discussed that his information is not always accurate on his Elenor Legato and he needs to fingerstick and check his blood sugar otherwise for comparison  Needs to avoid regular soft drinks Again need to modify any high fat or high carbohydrate meals or snacks If he is able to get back to walking he needs to start being more active  Reduce METFORMIN to 500 mg at bedtime  Consistently avoid nonsteroidal drugs and only use pain medications from his back doctor  He will continue Ozempic 1 mg However if he has higher blood sugars or weight gain may consider Mounjaro  There are no Patient Instructions on file for this visit.   Flu vaccine given and encouraged him to get the booster for the COVID bi-Valent vaccine and discussed the differences between this and the regular vaccine   Elayne Snare 05/20/2021, 11:08 AM

## 2021-05-20 NOTE — Progress Notes (Signed)
   Covid-19 Vaccination Clinic  Name:  MOHMED FARVER    MRN: [id number] DOB: 01-Dec-1955  05/20/2021  Mr. Lave was observed post Covid-19 immunization for 15 minutes without incident. He was provided with Vaccine Information Sheet and instruction to access the V-Safe system.   Mr. Mercier was instructed to call 911 with any severe reactions post vaccine: Difficulty breathing  Swelling of face and throat  A fast heartbeat  A bad rash all over body  Dizziness and weakness   Immunizations Administered     Name Date Dose VIS Date Route   Pfizer Covid-19 Vaccine Bivalent Booster 05/20/2021  2:44 PM 0.3 mL 02/26/2021 Intramuscular   Manufacturer: [place]   Lot: [id number]   [place]: [phone]

## 2021-05-20 NOTE — Patient Instructions (Addendum)
Bedtime Metformin is 1/2 tab  Check blood sugars on waking up   Also check blood sugars about 2 hours after meals and do this after different meals by rotation  Recommended blood sugar levels on waking up are 90-130 and about 2 hours after meal is 130-160  Please bring your blood sugar monitor to each visit, thank you  Get Bivalent Booster

## 2021-05-31 ENCOUNTER — Other Ambulatory Visit (HOSPITAL_BASED_OUTPATIENT_CLINIC_OR_DEPARTMENT_OTHER): Payer: Self-pay

## 2021-05-31 MED ORDER — PFIZER COVID-19 VAC BIVALENT 30 MCG/0.3ML IM SUSP
INTRAMUSCULAR | 0 refills | Status: DC
  Filled 2021-05-31: qty 0.3, 1d supply, fill #0

## 2021-06-02 ENCOUNTER — Other Ambulatory Visit (HOSPITAL_BASED_OUTPATIENT_CLINIC_OR_DEPARTMENT_OTHER): Payer: Self-pay

## 2021-06-03 DIAGNOSIS — M5442 Lumbago with sciatica, left side: Secondary | ICD-10-CM | POA: Diagnosis not present

## 2021-06-03 DIAGNOSIS — M4316 Spondylolisthesis, lumbar region: Secondary | ICD-10-CM | POA: Diagnosis not present

## 2021-06-03 DIAGNOSIS — M545 Low back pain, unspecified: Secondary | ICD-10-CM | POA: Diagnosis not present

## 2021-06-03 DIAGNOSIS — M5441 Lumbago with sciatica, right side: Secondary | ICD-10-CM | POA: Diagnosis not present

## 2021-06-03 DIAGNOSIS — Z6833 Body mass index (BMI) 33.0-33.9, adult: Secondary | ICD-10-CM | POA: Diagnosis not present

## 2021-06-03 DIAGNOSIS — Z7689 Persons encountering health services in other specified circumstances: Secondary | ICD-10-CM | POA: Diagnosis not present

## 2021-06-04 DIAGNOSIS — G8929 Other chronic pain: Secondary | ICD-10-CM | POA: Diagnosis not present

## 2021-06-04 DIAGNOSIS — M47816 Spondylosis without myelopathy or radiculopathy, lumbar region: Secondary | ICD-10-CM | POA: Diagnosis not present

## 2021-06-04 DIAGNOSIS — M5441 Lumbago with sciatica, right side: Secondary | ICD-10-CM | POA: Diagnosis not present

## 2021-06-04 DIAGNOSIS — Z982 Presence of cerebrospinal fluid drainage device: Secondary | ICD-10-CM | POA: Diagnosis not present

## 2021-06-04 DIAGNOSIS — M25552 Pain in left hip: Secondary | ICD-10-CM | POA: Diagnosis not present

## 2021-06-05 ENCOUNTER — Other Ambulatory Visit (HOSPITAL_COMMUNITY): Payer: Self-pay

## 2021-06-06 ENCOUNTER — Other Ambulatory Visit: Payer: Self-pay | Admitting: Pharmacist

## 2021-06-06 ENCOUNTER — Other Ambulatory Visit (HOSPITAL_COMMUNITY): Payer: Self-pay

## 2021-06-06 DIAGNOSIS — L409 Psoriasis, unspecified: Secondary | ICD-10-CM | POA: Diagnosis not present

## 2021-06-06 DIAGNOSIS — Z23 Encounter for immunization: Secondary | ICD-10-CM | POA: Diagnosis not present

## 2021-06-06 DIAGNOSIS — Z79899 Other long term (current) drug therapy: Secondary | ICD-10-CM | POA: Diagnosis not present

## 2021-06-06 MED ORDER — HUMIRA PEN 40 MG/0.8ML ~~LOC~~ PNKT
PEN_INJECTOR | SUBCUTANEOUS | 5 refills | Status: DC
  Filled 2021-06-06: qty 2, 28d supply, fill #0
  Filled 2021-07-01: qty 2, 28d supply, fill #1
  Filled 2021-07-28: qty 2, 28d supply, fill #2
  Filled 2021-08-20 – 2021-09-03 (×2): qty 2, 28d supply, fill #3
  Filled 2021-09-19: qty 2, 28d supply, fill #4
  Filled 2021-10-21: qty 2, 28d supply, fill #5

## 2021-06-06 MED ORDER — HUMIRA PEN 40 MG/0.8ML ~~LOC~~ PNKT: PEN_INJECTOR | SUBCUTANEOUS | 5 refills | Status: DC

## 2021-06-09 ENCOUNTER — Other Ambulatory Visit (HOSPITAL_COMMUNITY): Payer: Self-pay

## 2021-06-09 DIAGNOSIS — M5416 Radiculopathy, lumbar region: Secondary | ICD-10-CM | POA: Diagnosis not present

## 2021-06-11 DIAGNOSIS — M545 Low back pain, unspecified: Secondary | ICD-10-CM | POA: Diagnosis not present

## 2021-06-13 DIAGNOSIS — M545 Low back pain, unspecified: Secondary | ICD-10-CM | POA: Diagnosis not present

## 2021-06-16 ENCOUNTER — Other Ambulatory Visit: Payer: Self-pay | Admitting: Neurology

## 2021-06-16 ENCOUNTER — Other Ambulatory Visit: Payer: Self-pay | Admitting: Internal Medicine

## 2021-06-16 ENCOUNTER — Other Ambulatory Visit: Payer: Self-pay

## 2021-06-17 ENCOUNTER — Other Ambulatory Visit (HOSPITAL_BASED_OUTPATIENT_CLINIC_OR_DEPARTMENT_OTHER): Payer: Self-pay

## 2021-06-17 ENCOUNTER — Encounter: Payer: Self-pay | Admitting: Internal Medicine

## 2021-06-17 ENCOUNTER — Ambulatory Visit (INDEPENDENT_AMBULATORY_CARE_PROVIDER_SITE_OTHER): Payer: 59 | Admitting: Internal Medicine

## 2021-06-17 VITALS — BP 118/84 | HR 86 | Temp 98.0°F | Resp 18 | Ht 71.0 in | Wt 236.0 lb

## 2021-06-17 DIAGNOSIS — E1140 Type 2 diabetes mellitus with diabetic neuropathy, unspecified: Secondary | ICD-10-CM | POA: Diagnosis not present

## 2021-06-17 DIAGNOSIS — I2510 Atherosclerotic heart disease of native coronary artery without angina pectoris: Secondary | ICD-10-CM

## 2021-06-17 DIAGNOSIS — R432 Parageusia: Secondary | ICD-10-CM

## 2021-06-17 DIAGNOSIS — G8929 Other chronic pain: Secondary | ICD-10-CM | POA: Diagnosis not present

## 2021-06-17 DIAGNOSIS — M545 Low back pain, unspecified: Secondary | ICD-10-CM

## 2021-06-17 LAB — POC COVID19 BINAXNOW: SARS Coronavirus 2 Ag: NEGATIVE

## 2021-06-17 LAB — POCT GLUCOSE (DEVICE FOR HOME USE): Glucose Fasting, POC: 224 mg/dL — AB (ref 70–99)

## 2021-06-17 MED ORDER — CLONAZEPAM 0.5 MG PO TABS
ORAL_TABLET | Freq: Every day | ORAL | 5 refills | Status: DC
  Filled 2021-06-17: qty 30, 30d supply, fill #0
  Filled 2021-07-17: qty 30, 30d supply, fill #1
  Filled 2021-08-14: qty 30, 30d supply, fill #2
  Filled 2021-09-18: qty 30, 30d supply, fill #3
  Filled 2021-10-21: qty 30, 30d supply, fill #4
  Filled 2021-11-19: qty 30, 30d supply, fill #5

## 2021-06-17 MED ORDER — PANTOPRAZOLE SODIUM 40 MG PO TBEC
40.0000 mg | DELAYED_RELEASE_TABLET | Freq: Every day | ORAL | 1 refills | Status: DC
  Filled 2021-06-17: qty 90, 90d supply, fill #0
  Filled 2021-09-18: qty 90, 90d supply, fill #1

## 2021-06-17 NOTE — Progress Notes (Addendum)
Subjective:    Patient ID: James [name], male    DOB: 12/16/1955, 65 y.o.   MRN: [id number]  DOS:  06/17/2021 Type of visit - description: Acute visit  Got a epidural injection June 09, 2021, 3 days later developed a number of symptoms: Change in taste ("everything tastes different".  Not necessarily a metallic taste, not necessarily a lack of taste). Ambulatory CBGs are fluctuating more than usual from 350-120. Occasional hoarseness Dry mouth   Review of Systems Denies fever chills No sore throat or runny nose, does not feel like URI. No myalgias No nausea or vomiting.  No abdominal pain No cough or sputum production. No headache or mental fogginess.  Past Medical History:  Diagnosis Date   Abnormal cardiac CT angiography    Acid reflux    Annual physical exam 04/08/2015   Arthritis    Atypical chest pain 06/13/2020   Blood transfusion without reported diagnosis    Body mass index (BMI) 35.0-35.9, adult 04/05/2019   Chronic fatigue 01/28/2015   Chronic headaches    on cymbalta   Chronic migraine w/o aura, not intractable, w/o stat migr 10/24/2018   Cirrhosis ([place])    Colon polyps    Complication of anesthesia    problems waking up from anesthesia   Coronary artery disease 01/23/2019   Depression    on cymbalta   Diabetes mellitus with neuropathy ([place])    Diabetes with neuropathy 04/25/2013   Diverticulitis 03/2013   Dyslipidemia 05/29/2019   Eczema    Elevated LFTs    Epidural lipomatosis 10/05/2018   Essential (primary) hypertension 04/05/2019   Essential hypertension 10/10/2019   Fatty liver    GERD (gastroesophageal reflux disease) 04/28/2011   H/O craniotomy 05/07/2015   Headache 04/28/2011   Hepatitis 10/2017   NASH cirrhosis   History of kidney stones    Hyperlipidemia    Hypersomnia with sleep apnea 01/28/2015   Hypertension    IDA (iron deficiency anemia) 01/24/2019   Idiopathic intracranial hypertension 01/14/2017   Insomnia 04/26/2013   Kidney  stone    Liver cirrhosis secondary to NASH (nonalcoholic steatohepatitis) ([place]) 01/02/2016   Low back pain 04/05/2019   Lower back injury 08/14/2019   Morbid obesity ([place])    Neuromuscular disorder ([place])    neuropathy   Neuropathy    Nonalcoholic steatohepatitis 09/30/345   Obstructive hydrocephalus ([place]) 01/28/2015   OSA -- dx ~ 2012, cpap intolerant 09/04/2014    dx ~ 2012, cpap intolerant    PCP NOTES >>> 04/08/2015   Post-op pain 03/19/2019   Post-traumatic hydrocephalus ([place])    s/p shunts x 2 (first got infected )   Presence of cerebrospinal fluid drainage device 07/28/2011   Psoriasis    sees Dr [name]   Psoriatic arthritis ([place])    REM behavioral disorder 01/14/2017   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   see's Dr.Ortmann   Severe obesity (BMI >= 40) ([place]) 01/28/2015   SI (sacroiliac) joint dysfunction 08/14/2019   Sigmoid diverticulitis 04/25/2013   Sleep apnea    no CPAP      Spondylolisthesis, lumbar region 03/16/2019   Stomach ulcer    Testosterone deficiency 04/28/2011   VP (ventriculoperitoneal) shunt status 07/31/2020    Past Surgical History:  Procedure Laterality Date   [place]     VP shunts placed in 2007   CHOLECYSTECTOMY N/A 08/25/2017   Procedure: LAPAROSCOPIC CHOLECYSTECTOMY WITH INTRAOPERATIVE CHOLANGIOGRAM;  Surgeon: Autumn Messing III,  MD;  Location: [facility];  Service: General;  Laterality: N/A;   COLONOSCOPY     CORONARY STENT INTERVENTION N/A 09/27/2020   Procedure: CORONARY STENT INTERVENTION;  Surgeon: Jettie Booze, MD;  Location: [facility];  Service: Cardiovascular;  Laterality: N/A;   INTRAVASCULAR ULTRASOUND/IVUS N/A 09/27/2020   Procedure: Intravascular Ultrasound/IVUS;  Surgeon: Jettie Booze, MD;  Location: [facility];  Service: Cardiovascular;  Laterality: N/A;   JOINT REPLACEMENT     total hip   LEFT HEART CATH N/A 09/27/2020   Procedure: Left Heart Cath;  Surgeon: Jettie Booze,  MD;  Location: [facility];  Service: Cardiovascular;  Laterality: N/A;   LEFT HEART CATH AND CORONARY ANGIOGRAPHY N/A 09/24/2020   Procedure: LEFT HEART CATH AND CORONARY ANGIOGRAPHY;  Surgeon: Jettie Booze, MD;  Location: [facility];  Service: Cardiovascular;  Laterality: N/A;   LUMBAR FUSION  03/16/2019   SHOULDER SURGERY Left 2010   TOE SURGERY Left 2018   TONSILLECTOMY     as a child   TOTAL HIP ARTHROPLASTY Left 2011   UPPER GASTROINTESTINAL ENDOSCOPY  01/04/2020   VENTRICULOPERITONEAL SHUNT  2007   x2    Allergies as of 06/17/2021       Reactions   Bee Venom Anaphylaxis   Hydrocodone Bit-homatrop Mbr Other (See Comments)   Hallucinations, confusion, delirium Depressed feeling   Toradol [ketorolac Tromethamine] Other (See Comments)   Hallucinations, confusion, delirium   Sulfadiazine    NDC XBWI:[id number] NDC AGTX:[id number] NDC MGNO:[id number]   Morphine And Related Other (See Comments)   Hallucinations, back in the 80s. States has taken vicodin before w/o problems    Sulfa Drugs Cross Reactors Rash        Medication List        Accurate as of June 17, 2021 11:59 PM. If you have any questions, ask your nurse or doctor.          amLODipine 5 MG tablet Commonly known as: NORVASC Take 1 tablet (5 mg total) by mouth daily.   aspirin 81 MG EC tablet Take 1 tablet (81 mg total) by mouth daily. Swallow whole.   atorvastatin 80 MG tablet Commonly known as: LIPITOR Take 1 tablet (80 mg total) by mouth at bedtime.   carvedilol 12.5 MG tablet Commonly known as: COREG TAKE 1 TABLET (12.5 MG TOTAL) BY MOUTH 2 (TWO) TIMES DAILY WITH A MEAL. What changed: how much to take   clonazePAM 0.5 MG tablet Commonly known as: KLONOPIN TAKE 1 TABLET BY MOUTH EVERY NIGHT AT BEDTIME   clopidogrel 75 MG tablet Commonly known as: PLAVIX TAKE 1 TABLET (75 MG TOTAL) BY MOUTH DAILY.   DULoxetine 60 MG capsule Commonly known as: CYMBALTA TAKE  2 CAPSULES (120 MG TOTAL) BY MOUTH DAILY. What changed:  how much to take how to take this when to take this   EPINEPHrine 0.3 mg/0.3 mL Soaj injection Commonly known as: EpiPen 2-Pak Inject 0.3 mLs (0.3 mg total) into the muscle once as needed for up to 1 dose for anaphylaxis.   fenofibrate micronized 134 MG capsule Commonly known as: LOFIBRA TAKE 1 CAPSULE BY MOUTH THREE TIMES WEEKLY What changed:  how much to take how to take this when to take this   FreeStyle Libre 14 Day Sensor Misc Apply 1 sensor every 14 (fourteen) days. What changed: additional instructions   FreeStyle Libre 2 Reader [facility] Use to check blood sugar daily What changed:  how much  to take how to take this when to take this   gabapentin 600 MG tablet Commonly known as: NEURONTIN TAKE 1 TABLET (600 MG TOTAL) BY MOUTH 2 (TWO) TIMES DAILY. What changed:  how much to take how to take this when to take this   glimepiride 4 MG tablet Commonly known as: AMARYL TAKE 1 TABLET BY MOUTH ONCE DAILY BEFORE BREAKFAST What changed:  how much to take when to take this   HumaLOG 100 UNIT/ML injection Generic drug: insulin lispro Use pump to inject 50 units once daily What changed:  how much to take when to take this   Humira Pen 40 MG/0.8ML Pnkt Generic drug: Adalimumab INJECT 40 MG (0.8 ML) UNDER THE SKIN EVERY OTHER WEEK.   isosorbide mononitrate 30 MG 24 hr tablet Commonly known as: IMDUR TAKE 1 TABLET BY MOUTH ONCE DAILY What changed: how much to take   metFORMIN 500 MG tablet Commonly known as: GLUCOPHAGE Take 2 tablets (1,000 mg total) by mouth 2 (two) times daily.   nitroGLYCERIN 0.4 MG SL tablet Commonly known as: NITROSTAT PLACE 1 TABLETS UNDER THE TONGUE EVERY 5 MINS AS NEEDED FOR CHEST PAIN   Omnipod 5 G6 Pod (Gen 5) Misc CHANGE EVERY 72 HOURS AS DIRECTED What changed:  how much to take how to take this when to take this additional instructions   Ozempic (2 MG/DOSE) 8 MG/3ML  Sopn Generic drug: Semaglutide (2 MG/DOSE) Inject 2 mg into the skin weekly What changed:  how much to take how to take this when to take this   pantoprazole 40 MG tablet Commonly known as: PROTONIX Take 1 tablet (40 mg total) by mouth daily.   Vascepa 1 g capsule Generic drug: icosapent Ethyl Take 2 capsules (2 g total) by mouth 2 (two) times daily.           Objective:   Physical Exam BP 118/84 (BP Location: Right Arm, Patient Position: Sitting, Cuff Size: Normal)    Pulse 86    Temp 98 F (36.7 C) (Oral)    Resp 18    Ht 5' 11"  (1.803 m)    Wt 236 lb (107 kg)    SpO2 98%    BMI 32.92 kg/m  General:   Well developed, NAD, BMI noted.  No cough during the visit. HEENT:  Normocephalic . Face symmetric, atraumatic. Tongue with no white patches.  Oral membranes in general slightly dry.  Mild hoarseness noted. Lungs:  CTA B Normal respiratory effort, no intercostal retractions, no accessory muscle use. Heart: RRR,  no murmur.  Lower extremities: no pretibial edema bilaterally  Skin: Not pale. Not jaundice Neurologic:  alert & oriented X3.  Speech normal, gait appropriate for age and unassisted Psych--  Cognition and judgment appear intact.  Cooperative with normal attention span and concentration.  Behavior appropriate. No anxious or depressed appearing.      Assessment     Assessment  DM - Dr [name] Neuropathy (x years, rx gaba 05-2014, w/u 11-2014  RPR neg, vit D-B12-Folic Acid wnl ); Saw Dr Posey Pronto, [facility] [phone] (see results) HTN: History of AKI with ARBs  CRI, sees nephrology, etiology felt to be hHyperglycemia, intermittent NSAIDs, contrast exposures Hyperlipidemia (TG in the 500s 2016) OSA , dx 2012, sleep study again 02-2015 Dr Dohmeier--> severe OSA, intolerant to CPAP Depression, insomnia: on Cymbalta NEURO: --Chronic headaches :on Cymbalta  --Posttraumatic hydrocephalus s/p 2 shunts (first got infected) MSK: on disability d/t back pain- HAs GI:  --GERD,  diverticulitis 2014,  h/o PUD -- NASH with cirrhosis per GI note 10/2017, s/p Hep A/B shots --Anemia: - felt to be d/t   GAVE (gastric antral vascular ectasia) and a inflammatory polyp, s/p  EGD 10/2018    -Work-up repeated 10/2019: EGD: GAVE  versus portal hypertensive gastropathy. Colonoscopy polyps.  Tubular adenoma. Gastric BX negative H. pylori, reactive changes. Psoriasis, psoriatic arthritis: used  HUMIRA  CV: +FH CAD brother MI age 52 Abnormal EKG, saw cardiology 01-2019, echo essentially negative, perfusion stress test 03/01/2019 (-) CAD: CP, stent 09-2020 H/o urolithiasis Hypogonadism  Dx 2012, normal T 11-2014 (on no RX)  PLAN Dysgeusia: Develop several symptoms (funny taste, dry mouth, fluctuant CBGs) after a local steroid injection (epidural). Possibly side effects? Exam is benign, I tested for COVID: Negative.  Rec observation, good hydration.  Call if symptoms increase. DM: CBG now 244, if they are consistently going over 350 contact Endo. Back pain: had a epidural injection December 12, it is helping the pain but has developed some side effects, see above      This visit occurred during the SARS-CoV-2 public health emergency.  Safety protocols were in place, including screening questions prior to the visit, additional usage of staff PPE, and extensive cleaning of exam room while observing appropriate contact time as indicated for disinfecting solutions.

## 2021-06-17 NOTE — Telephone Encounter (Signed)
Patient is up to date on his appointments and is due for a refill on his klonopin.  Controlled Substance Registry checked and is appropriate.  Will send to [facility] in Dr. Garth Bigness absence.

## 2021-06-17 NOTE — Patient Instructions (Addendum)
Continue the same medications  Drink plenty of water  If your sugars are more than 350 consistently contact endocrinology  If you feel much worse if you are not gradually improving   let us know.

## 2021-06-18 DIAGNOSIS — M545 Low back pain, unspecified: Secondary | ICD-10-CM | POA: Diagnosis not present

## 2021-06-18 NOTE — Assessment & Plan Note (Addendum)
Dysgeusia: Develop several symptoms (funny taste, dry mouth, fluctuant CBGs) after a local steroid injection (epidural). Possibly side effects? Exam is benign, I tested for COVID: Negative.  Rec observation, good hydration.  Call if symptoms increase. DM: CBG now 244, if they are consistently going over 350 contact Endo. Back pain: had a epidural injection December 12, it is helping the pain but has developed some side effects, see above

## 2021-06-20 DIAGNOSIS — M545 Low back pain, unspecified: Secondary | ICD-10-CM | POA: Diagnosis not present

## 2021-06-24 DIAGNOSIS — M545 Low back pain, unspecified: Secondary | ICD-10-CM | POA: Diagnosis not present

## 2021-06-26 ENCOUNTER — Ambulatory Visit (INDEPENDENT_AMBULATORY_CARE_PROVIDER_SITE_OTHER): Payer: 59 | Admitting: Internal Medicine

## 2021-06-26 ENCOUNTER — Other Ambulatory Visit (HOSPITAL_BASED_OUTPATIENT_CLINIC_OR_DEPARTMENT_OTHER): Payer: Self-pay

## 2021-06-26 ENCOUNTER — Encounter: Payer: Self-pay | Admitting: Internal Medicine

## 2021-06-26 ENCOUNTER — Other Ambulatory Visit: Payer: Self-pay

## 2021-06-26 VITALS — BP 132/84 | HR 75 | Temp 97.9°F | Resp 18 | Ht 71.0 in | Wt 237.6 lb

## 2021-06-26 DIAGNOSIS — K7581 Nonalcoholic steatohepatitis (NASH): Secondary | ICD-10-CM | POA: Diagnosis not present

## 2021-06-26 DIAGNOSIS — Z0000 Encounter for general adult medical examination without abnormal findings: Secondary | ICD-10-CM | POA: Diagnosis not present

## 2021-06-26 DIAGNOSIS — K7460 Unspecified cirrhosis of liver: Secondary | ICD-10-CM

## 2021-06-26 DIAGNOSIS — E785 Hyperlipidemia, unspecified: Secondary | ICD-10-CM | POA: Diagnosis not present

## 2021-06-26 DIAGNOSIS — K3189 Other diseases of stomach and duodenum: Secondary | ICD-10-CM

## 2021-06-26 DIAGNOSIS — M545 Low back pain, unspecified: Secondary | ICD-10-CM | POA: Diagnosis not present

## 2021-06-26 DIAGNOSIS — I10 Essential (primary) hypertension: Secondary | ICD-10-CM | POA: Diagnosis not present

## 2021-06-26 LAB — CBC WITH DIFFERENTIAL/PLATELET
Basophils Absolute: 0 10*3/uL (ref 0.0–0.1)
Basophils Relative: 0.2 % (ref 0.0–3.0)
Eosinophils Absolute: 0 10*3/uL (ref 0.0–0.7)
Eosinophils Relative: 0.7 % (ref 0.0–5.0)
HCT: 42.4 % (ref 39.0–52.0)
Hemoglobin: 14 g/dL (ref 13.0–17.0)
Lymphocytes Relative: 24.1 % (ref 12.0–46.0)
Lymphs Abs: 1.6 10*3/uL (ref 0.7–4.0)
MCHC: 33.1 g/dL (ref 30.0–36.0)
MCV: 92.1 fl (ref 78.0–100.0)
Monocytes Absolute: 0.5 10*3/uL (ref 0.1–1.0)
Monocytes Relative: 7.1 % (ref 3.0–12.0)
Neutro Abs: 4.5 10*3/uL (ref 1.4–7.7)
Neutrophils Relative %: 67.9 % (ref 43.0–77.0)
Platelets: 142 10*3/uL — ABNORMAL LOW (ref 150.0–400.0)
RBC: 4.61 Mil/uL (ref 4.22–5.81)
RDW: 16.6 % — ABNORMAL HIGH (ref 11.5–15.5)
WBC: 6.6 10*3/uL (ref 4.0–10.5)

## 2021-06-26 LAB — HEPATIC FUNCTION PANEL
ALT: 38 U/L (ref 0–53)
AST: 22 U/L (ref 0–37)
Albumin: 4 g/dL (ref 3.5–5.2)
Alkaline Phosphatase: 70 U/L (ref 39–117)
Bilirubin, Direct: 0.2 mg/dL (ref 0.0–0.3)
Total Bilirubin: 0.9 mg/dL (ref 0.2–1.2)
Total Protein: 7.2 g/dL (ref 6.0–8.3)

## 2021-06-26 LAB — TSH: TSH: 1.22 u[IU]/mL (ref 0.35–5.50)

## 2021-06-26 MED ORDER — NITROGLYCERIN 0.4 MG SL SUBL
0.4000 mg | SUBLINGUAL_TABLET | SUBLINGUAL | 6 refills | Status: DC | PRN
  Filled 2021-06-26: qty 25, 1d supply, fill #0

## 2021-06-26 NOTE — Assessment & Plan Note (Signed)
--   Td 2014 - PNM 23: 2019 - PNM 20 : 11/2020 - shingrex: recommended, declined. -Last COVID-vaccine 04-2021, up-to-date. - Had a flu shot --Colonoscopy 07-2013,  had a polyp.  C-scope 12-2019, next per GI Dr. Henrene Pastor. --Prostate cancer screening: All previous PSAs normal, last one year ago.  No FH. Reassess next year -Diet and exercise discussed --Labs: CBC, LFTs, TSH.  Other labs are up-to-date.  -ACP package of information provided

## 2021-06-26 NOTE — Assessment & Plan Note (Signed)
Here for CPX DM: Last visit with Endo 05/20/2021. Neuropathy: Physical exam confirmatory, on Cymbalta.  Feet care discussed. HTN: Seems well controlled on amlodipine, carvedilol, Imdur. Dyslipidemia: On Lipitor, fenofibrate, Vascepa.  Last LDL satisfactory few months ago.  Recheck on RTC. CAD, stent 09/2020, last visit with cardiology 04/18/2021, felt to be stable.  ASX, rec to renew NTG every few months, rx  sent. CRI, last creatinine 1.5, stable.  No recent nephrology visit. ED: Saw urology 03/31/2021, they noted good respond with Trimix Posttraumatic hydrocephalus, back pain: F/u by neurosurgery per pt , had a OV 06/14/2021, actual note not available. Had a recent local epidural  injection  RTC 4 months

## 2021-06-26 NOTE — Patient Instructions (Signed)
Check the  blood pressure regularly BP GOAL is between 110/65 and  135/85. If it is consistently higher or lower, let me know  Please read the information about   advance care planning, if you have a living will recommend to bring a copy  GO TO THE LAB : Get the blood work     [place], [place] back for a checkup in 4 months    Diabetes Mellitus and [place] care is an important part of your health, especially when you have diabetes. Diabetes may cause you to have problems because of poor blood flow (circulation) to your feet and legs, which can cause your skin to: Become thinner and drier. Break more easily. Heal more slowly. Peel and crack. You may also have nerve damage (neuropathy) in your legs and feet, causing decreased feeling in them. This means that you may not notice minor injuries to your feet that could lead to more serious problems. Noticing and addressing any potential problems early is the best way to prevent future foot problems. How to care for your feet Foot hygiene  Wash your feet daily with warm water and mild soap. Do not use hot water. Then, pat your feet and the areas between your toes until they are completely dry. Do not soak your feet as this can dry your skin. Trim your toenails straight across. Do not dig under them or around the cuticle. File the edges of your nails with an emery board or nail file. Apply a moisturizing lotion or petroleum jelly to the skin on your feet and to dry, brittle toenails. Use lotion that does not contain alcohol and is unscented. Do not apply lotion between your toes. Shoes and socks Wear clean socks or stockings every day. Make sure they are not too tight. Do not wear knee-high stockings since they may decrease blood flow to your legs. Wear shoes that fit properly and have enough cushioning. Always look in your shoes before you put them on to be sure there are no objects inside. To  break in new shoes, wear them for just a few hours a day. This prevents injuries on your feet. Wounds, scrapes, corns, and calluses  Check your feet daily for blisters, cuts, bruises, sores, and redness. If you cannot see the bottom of your feet, use a mirror or ask someone for help. Do not cut corns or calluses or try to remove them with medicine. If you find a minor scrape, cut, or break in the skin on your feet, keep it and the skin around it clean and dry. You may clean these areas with mild soap and water. Do not clean the area with peroxide, alcohol, or iodine. If you have a wound, scrape, corn, or callus on your foot, look at it several times a day to make sure it is healing and not infected. Check for: Redness, swelling, or pain. Fluid or blood. Warmth. Pus or a bad smell. General tips Do not cross your legs. This may decrease blood flow to your feet. Do not use heating pads or hot water bottles on your feet. They may burn your skin. If you have lost feeling in your feet or legs, you may not know this is happening until it is too late. Protect your feet from hot and cold by wearing shoes, such as at the beach or on hot pavement. Schedule a complete foot exam at least once a year (annually) or more  often if you have foot problems. Report any cuts, sores, or bruises to your health care provider immediately. Where to find more information American Diabetes Association: www.diabetes.org Association of Diabetes Care & Education Specialists: www.diabeteseducator.org Contact a health care provider if: You have a medical condition that increases your risk of infection and you have any cuts, sores, or bruises on your feet. You have an injury that is not healing. You have redness on your legs or feet. You feel burning or tingling in your legs or feet. You have pain or cramps in your legs and feet. Your legs or feet are numb. Your feet always feel cold. You have pain around any toenails. Get  help right away if: You have a wound, scrape, corn, or callus on your foot and: You have pain, swelling, or redness that gets worse. You have fluid or blood coming from the wound, scrape, corn, or callus. Your wound, scrape, corn, or callus feels warm to the touch. You have pus or a bad smell coming from the wound, scrape, corn, or callus. You have a fever. You have a red line going up your leg. Summary Check your feet every day for blisters, cuts, bruises, sores, and redness. Apply a moisturizing lotion or petroleum jelly to the skin on your feet and to dry, brittle toenails. Wear shoes that fit properly and have enough cushioning. If you have foot problems, report any cuts, sores, or bruises to your health care provider immediately. Schedule a complete foot exam at least once a year (annually) or more often if you have foot problems. This information is not intended to replace advice given to you by your health care provider. Make sure you discuss any questions you have with your health care provider. Document Revised: 01/04/2020 Document Reviewed: 01/04/2020 Elsevier Patient Education  [place].

## 2021-06-26 NOTE — Progress Notes (Signed)
Subjective:    Patient ID: James [name], male    DOB: August 09, 1955, 65 y.o.   MRN: [id number]  DOS:  06/26/2021 Type of visit - description: cpx  Here for physical exam. In general feels well. He specifically denies chest pain no difficulty breathing. Emotionally doing well. Neuropathy symptoms at baseline. Sees other physicians, notes reviewed.   Review of Systems  Other than above, a 14 point review of systems is negative     Past Medical History:  Diagnosis Date   Abnormal cardiac CT angiography    Acid reflux    Annual physical exam 04/08/2015   Arthritis    Atypical chest pain 06/13/2020   Blood transfusion without reported diagnosis    Body mass index (BMI) 35.0-35.9, adult 04/05/2019   Chronic fatigue 01/28/2015   Chronic headaches    on cymbalta   Chronic migraine w/o aura, not intractable, w/o stat migr 10/24/2018   Cirrhosis ([place])    Colon polyps    Complication of anesthesia    problems waking up from anesthesia   Coronary artery disease 01/23/2019   Depression    on cymbalta   Diabetes mellitus with neuropathy ([place])    Diabetes with neuropathy 04/25/2013   Diverticulitis 03/2013   Dyslipidemia 05/29/2019   Eczema    Elevated LFTs    Epidural lipomatosis 10/05/2018   Essential (primary) hypertension 04/05/2019   Essential hypertension 10/10/2019   Fatty liver    GERD (gastroesophageal reflux disease) 04/28/2011   H/O craniotomy 05/07/2015   Headache 04/28/2011   Hepatitis 10/2017   NASH cirrhosis   History of kidney stones    Hyperlipidemia    Hypersomnia with sleep apnea 01/28/2015   Hypertension    IDA (iron deficiency anemia) 01/24/2019   Idiopathic intracranial hypertension 01/14/2017   Insomnia 04/26/2013   Kidney stone    Liver cirrhosis secondary to NASH (nonalcoholic steatohepatitis) ([place]) 01/02/2016   Low back pain 04/05/2019   Lower back injury 08/14/2019   Morbid obesity ([place])    Neuromuscular disorder ([place])    neuropathy   Neuropathy     Nonalcoholic steatohepatitis 03/05/6733   Obstructive hydrocephalus ([place]) 01/28/2015   OSA -- dx ~ 2012, cpap intolerant 09/04/2014    dx ~ 2012, cpap intolerant    PCP NOTES >>> 04/08/2015   Post-op pain 03/19/2019   Post-traumatic hydrocephalus ([place])    s/p shunts x 2 (first got infected )   Presence of cerebrospinal fluid drainage device 07/28/2011   Psoriasis    sees Dr [name]   Psoriatic arthritis ([place])    REM behavioral disorder 01/14/2017   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   see's Dr.Ortmann   Severe obesity (BMI >= 40) ([place]) 01/28/2015   SI (sacroiliac) joint dysfunction 08/14/2019   Sigmoid diverticulitis 04/25/2013   Sleep apnea    no CPAP      Spondylolisthesis, lumbar region 03/16/2019   Stomach ulcer    Testosterone deficiency 04/28/2011   VP (ventriculoperitoneal) shunt status 07/31/2020    Past Surgical History:  Procedure Laterality Date   [place]     VP shunts placed in 2007   CHOLECYSTECTOMY N/A 08/25/2017   Procedure: LAPAROSCOPIC CHOLECYSTECTOMY WITH INTRAOPERATIVE CHOLANGIOGRAM;  Surgeon: [name], MD;  Location: [facility];  Service: General;  Laterality: N/A;   COLONOSCOPY     CORONARY STENT INTERVENTION N/A 09/27/2020   Procedure: CORONARY STENT INTERVENTION;  Surgeon: Jettie Booze, MD;  Location: [facility]  INVASIVE CV LAB;  Service: Cardiovascular;  Laterality: N/A;   INTRAVASCULAR ULTRASOUND/IVUS N/A 09/27/2020   Procedure: Intravascular Ultrasound/IVUS;  Surgeon: Jettie Booze, MD;  Location: [facility] CV LAB;  Service: Cardiovascular;  Laterality: N/A;   JOINT REPLACEMENT     total hip   LEFT HEART CATH N/A 09/27/2020   Procedure: Left Heart Cath;  Surgeon: Jettie Booze, MD;  Location: [facility] CV LAB;  Service: Cardiovascular;  Laterality: N/A;   LEFT HEART CATH AND CORONARY ANGIOGRAPHY N/A 09/24/2020   Procedure: LEFT HEART CATH AND CORONARY ANGIOGRAPHY;  Surgeon: Jettie Booze, MD;   Location: [facility] CV LAB;  Service: Cardiovascular;  Laterality: N/A;   LUMBAR FUSION  03/16/2019   SHOULDER SURGERY Left 2010   TOE SURGERY Left 2018   TONSILLECTOMY     as a child   TOTAL HIP ARTHROPLASTY Left 2011   UPPER GASTROINTESTINAL ENDOSCOPY  01/04/2020   VENTRICULOPERITONEAL SHUNT  2007   x2    Social History   Socioeconomic History   Marital status: Married    Spouse name: [name]   Number of children: 2   Years of education: Not on file   Highest education level: Not on file  Occupational History   Occupation: disabled   Tobacco Use   Smoking status: Never   Smokeless tobacco: Never  Vaping Use   Vaping Use: Never used  Substance and Sexual Activity   Alcohol use: No   Drug use: No   Sexual activity: Yes    Partners: Female  Other Topics Concern   Not on file  Social History Narrative   Household-- pt , wife, one adult son with Down's syndrome   younger son lives in [place] 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   Social Determinants of Health   Financial Resource Strain: Low Risk    Difficulty of Paying Living Expenses: Not hard at all  Food Insecurity: No Food Insecurity   Worried About Charity fundraiser in the Last Year: Never true   [place] in the Last Year: Never true  Transportation Needs: No Transportation Needs   Lack of Transportation (Medical): No   Lack of Transportation (Non-Medical): No  Physical Activity: Inactive   Days of Exercise per Week: 0 days   Minutes of Exercise per Session: 0 min  Stress: No Stress Concern Present   Feeling of Stress : Not at all  Social Connections: Moderately Integrated   Frequency of Communication with Friends and Family: More than three times a week   Frequency of Social Gatherings with Friends and Family: Once a week   Attends Religious Services: Never   Marine scientist or Organizations: Yes   Attends Music therapist: 1 to 4  times per year   Marital Status: Married  Human resources officer Violence: Not At Risk   Fear of Current or Ex-Partner: No   Emotionally Abused: No   Physically Abused: No   Sexually Abused: No     Allergies as of 06/26/2021       Reactions   Bee Venom Anaphylaxis   Hydrocodone Bit-homatrop Mbr Other (See Comments)   Hallucinations, confusion, delirium Depressed feeling   Toradol [ketorolac Tromethamine] Other (See Comments)   Hallucinations, confusion, delirium   Sulfadiazine    NDC GQQP:[id number] NDC IWPY:[id number] NDC LZJQ:[id number]   Morphine And Related Other (See Comments)   Hallucinations, back in the 80s. States has taken  vicodin before w/o problems    Sulfa Drugs Cross Reactors Rash        Medication List        Accurate as of June 26, 2021  5:18 PM. If you have any questions, ask your nurse or doctor.          amLODipine 5 MG tablet Commonly known as: NORVASC Take 1 tablet (5 mg total) by mouth daily.   aspirin 81 MG EC tablet Take 1 tablet (81 mg total) by mouth daily. Swallow whole.   atorvastatin 80 MG tablet Commonly known as: LIPITOR Take 1 tablet (80 mg total) by mouth at bedtime.   carvedilol 12.5 MG tablet Commonly known as: COREG TAKE 1 TABLET (12.5 MG TOTAL) BY MOUTH 2 (TWO) TIMES DAILY WITH A MEAL. What changed: how much to take   clonazePAM 0.5 MG tablet Commonly known as: KLONOPIN TAKE 1 TABLET BY MOUTH EVERY NIGHT AT BEDTIME   clopidogrel 75 MG tablet Commonly known as: PLAVIX TAKE 1 TABLET (75 MG TOTAL) BY MOUTH DAILY.   DULoxetine 60 MG capsule Commonly known as: CYMBALTA TAKE 2 CAPSULES (120 MG TOTAL) BY MOUTH DAILY. What changed:  how much to take how to take this when to take this   EPINEPHrine 0.3 mg/0.3 mL Soaj injection Commonly known as: EpiPen 2-Pak Inject 0.3 mLs (0.3 mg total) into the muscle once as needed for up to 1 dose for anaphylaxis.   fenofibrate micronized 134 MG capsule Commonly known as:  LOFIBRA TAKE 1 CAPSULE BY MOUTH THREE TIMES WEEKLY What changed:  how much to take how to take this when to take this   FreeStyle Libre 14 Day Sensor Misc Apply 1 sensor every 14 (fourteen) days. What changed: additional instructions   FreeStyle Libre 2 Reader Devi Use to check blood sugar daily What changed:  how much to take how to take this when to take this   gabapentin 600 MG tablet Commonly known as: NEURONTIN TAKE 1 TABLET (600 MG TOTAL) BY MOUTH 2 (TWO) TIMES DAILY. What changed:  how much to take how to take this when to take this   glimepiride 4 MG tablet Commonly known as: AMARYL TAKE 1 TABLET BY MOUTH ONCE DAILY BEFORE BREAKFAST What changed:  how much to take when to take this   HumaLOG 100 UNIT/ML injection Generic drug: insulin lispro Use pump to inject 50 units once daily What changed:  how much to take when to take this   Humira Pen 40 MG/0.8ML Pnkt Generic drug: Adalimumab INJECT 40 MG (0.8 ML) UNDER THE SKIN EVERY OTHER WEEK.   isosorbide mononitrate 30 MG 24 hr tablet Commonly known as: IMDUR TAKE 1 TABLET BY MOUTH ONCE DAILY What changed: how much to take   metFORMIN 500 MG tablet Commonly known as: GLUCOPHAGE Take 2 tablets (1,000 mg total) by mouth 2 (two) times daily.   nitroGLYCERIN 0.4 MG SL tablet Commonly known as: NITROSTAT Place 1 tablet (0.4 mg total) under the tongue every 5 (five) minutes for 3 doses as needed for chest pain. What changed:  how much to take how to take this when to take this reasons to take this Changed by: Kathlene November, MD   Omnipod 5 G6 Pod (Gen 5) Misc CHANGE EVERY 72 HOURS AS DIRECTED What changed:  how much to take how to take this when to take this additional instructions   Ozempic (2 MG/DOSE) 8 MG/3ML Sopn Generic drug: Semaglutide (2 MG/DOSE) Inject 2 mg into the skin  weekly What changed:  how much to take how to take this when to take this   pantoprazole 40 MG tablet Commonly known  as: PROTONIX Take 1 tablet (40 mg total) by mouth daily.   Vascepa 1 g capsule Generic drug: icosapent Ethyl Take 2 capsules (2 g total) by mouth 2 (two) times daily.           Objective:   Physical Exam BP 132/84 (BP Location: Left Arm, Patient Position: Sitting, Cuff Size: Normal)    Pulse 75    Temp 97.9 F (36.6 C) (Oral)    Resp 18    Ht 5' 11"  (1.803 m)    Wt 237 lb 9.6 oz (107.8 kg)    SpO2 99%    BMI 33.14 kg/m  General: Well developed, NAD, BMI noted Neck: No  thyromegaly  HEENT:  Normocephalic . Face symmetric, atraumatic Lungs:  CTA B Normal respiratory effort, no intercostal retractions, no accessory muscle use. Heart: RRR,  no murmur.  Abdomen:  Not distended, soft, non-tender. No rebound or rigidity.   DM foot exam: No edema, normal pedal pulses, pinprick examination decrease in sock distribution Skin: Exposed areas without rash. Not pale. Not jaundice Neurologic:  alert & oriented X3.  Speech normal, gait appropriate for age and unassisted Strength symmetric and appropriate for age.  Psych: Cognition and judgment appear intact.  Cooperative with normal attention span and concentration.  Behavior appropriate. No anxious or depressed appearing.     Assessment    Assessment  DM - Dr [name] Neuropathy (x years, rx gaba 05-2014, w/u 11-2014  RPR neg, vit D-B12-Folic Acid wnl ); Saw Dr Posey Pronto, NCS [phone] (see results) HTN: History of AKI with ARBs  CRI, sees nephrology, etiology felt to be hHyperglycemia, intermittent NSAIDs, contrast exposures Hyperlipidemia (TG in the 500s 2016) OSA , dx 2012, sleep study again 02-2015 Dr Dohmeier--> severe OSA, intolerant to CPAP Depression, insomnia: on Cymbalta NEURO: --Chronic headaches :on Cymbalta  --Posttraumatic hydrocephalus s/p 2 shunts (first got infected) MSK: on disability d/t back pain- HAs GI:  --GERD, diverticulitis 2014, h/o PUD -- NASH with cirrhosis per GI note 10/2017, s/p Hep A/B shots --Anemia: -  felt to be d/t   GAVE (gastric antral vascular ectasia) and a inflammatory polyp, s/p  EGD 10/2018    -Work-up repeated 10/2019: EGD: GAVE  versus portal hypertensive gastropathy. Colonoscopy polyps.  Tubular adenoma. Gastric BX negative H. pylori, reactive changes. Psoriasis, psoriatic arthritis: used  HUMIRA  CV: +FH CAD brother MI age 16 Abnormal EKG, saw cardiology 01-2019, echo essentially negative, perfusion stress test 03/01/2019 (-) CAD: CP, stent 09-2020 H/o urolithiasis Hypogonadism  Dx 2012, normal T 11-2014 (on no RX)  PLAN Here for CPX DM: Last visit with Endo 05/20/2021. Neuropathy: Physical exam confirmatory, on Cymbalta.  Feet care discussed. HTN: Seems well controlled on amlodipine, carvedilol, Imdur. Dyslipidemia: On Lipitor, fenofibrate, Vascepa.  Last LDL satisfactory few months ago.  Recheck on RTC. CAD, stent 09/2020, last visit with cardiology 04/18/2021, felt to be stable.  ASX, rec to renew NTG every few months, rx  sent. CRI, last creatinine 1.5, stable.  No recent nephrology visit. ED: Saw urology 03/31/2021, they noted good respond with Trimix Posttraumatic hydrocephalus, back pain: F/u by neurosurgery per pt , had a OV 06/14/2021, actual note not available. Had a recent local epidural  injection  RTC 4 months    This visit occurred during the SARS-CoV-2 public health emergency.  Safety protocols were in place, including screening  questions prior to the visit, additional usage of staff PPE, and extensive cleaning of exam room while observing appropriate contact time as indicated for disinfecting solutions.

## 2021-07-01 ENCOUNTER — Other Ambulatory Visit (HOSPITAL_COMMUNITY): Payer: Self-pay

## 2021-07-02 ENCOUNTER — Telehealth: Payer: Self-pay

## 2021-07-02 ENCOUNTER — Other Ambulatory Visit (HOSPITAL_COMMUNITY): Payer: Self-pay

## 2021-07-02 DIAGNOSIS — M545 Low back pain, unspecified: Secondary | ICD-10-CM | POA: Diagnosis not present

## 2021-07-02 NOTE — Telephone Encounter (Signed)
Per Dr. Blanch Media request, called pt to schedule f/u appt, ideally in March to review results of [place]. LVM requesting returned call.

## 2021-07-02 NOTE — Telephone Encounter (Signed)
-----   Message from Irene Shipper, MD sent at 07/02/2021  1:54 PM EST ----- Regarding: RE: 6 month US liver mass He just saw his primary care provider Dr. [name].  I reviewed that.  I reviewed blood work, which looks fine. As such, routine GI office follow-up in 6 to 12 months would be fine.  Thanks ----- Message ----- From: Aleatha Borer, LPN Sent: 03/07/2425  83:41 PM EST To: Audrea Muscat, CMA, Irene Shipper, MD Subject: Melton Alar: 6 month US liver mass                      Routing this message to Julieanne Cotton, CMA and Dr. Henrene Pastor to determine when he would like pt to schedule f/u appt. LOV with Dr. Henrene Pastor was 03/12/20. Repeat US scheduled as follows:  Appointment Information Name: Ruddy, Swire" MRN: [id number] Date: 09/24/2021 Status: Sch Time: 9:00 AM Length: 60 Visit Type: US ABDOMEN COMPLETE [facility] [[id number]] Copay: $0.00 Provider: [facility] 4 Department: [facility] Referring Provider: Irene Shipper CSN: [id number] Notes: S/w pt arrive @ 845am, NPO after midnight or 6-8hrs prior.Kennieth Rad  Made On: Change Notes: 06/27/2021 2:55 PM 06/27/2021 2:56 PM By: By: Jerrilyn Cairo  ----- Message ----- From: Aleatha Borer, LPN Sent: 70/26/3785   8:53 AM EST To: April H Pait, Roosvelt Maser, # Subject: FW: 6 month US liver mass                      Please call pt to schedule [place]  RADIOLOGY SCHEDULING REQUEST  [facility] Phone: [phone] Fax: [phone]   Imaging Ordered: [place] abd  Diagnosis: liver mass  Ordering Provider: Dr. Henrene Pastor  Is a Prior Authorization needed? We are in the process of obtaining it now  Thank you for your assistance! [facility]   ----- Message ----- From: Shea Stakes, RN Sent: 06/26/2021  12:00 AM EST To: Algernon Huxley, RN Subject: 6 month US liver mass                          6 month US liver mass

## 2021-07-03 NOTE — Telephone Encounter (Signed)
[place] scheduled 09/24/21. Reminder created to call pt to schedule f/u appt to review results per Dr. Blanch Media request.

## 2021-07-04 DIAGNOSIS — M545 Low back pain, unspecified: Secondary | ICD-10-CM | POA: Diagnosis not present

## 2021-07-07 ENCOUNTER — Other Ambulatory Visit (HOSPITAL_COMMUNITY): Payer: Self-pay

## 2021-07-08 ENCOUNTER — Other Ambulatory Visit (HOSPITAL_COMMUNITY): Payer: Self-pay

## 2021-07-09 DIAGNOSIS — M545 Low back pain, unspecified: Secondary | ICD-10-CM | POA: Diagnosis not present

## 2021-07-11 DIAGNOSIS — M545 Low back pain, unspecified: Secondary | ICD-10-CM | POA: Diagnosis not present

## 2021-07-18 ENCOUNTER — Other Ambulatory Visit (HOSPITAL_BASED_OUTPATIENT_CLINIC_OR_DEPARTMENT_OTHER): Payer: Self-pay

## 2021-07-24 ENCOUNTER — Other Ambulatory Visit (HOSPITAL_BASED_OUTPATIENT_CLINIC_OR_DEPARTMENT_OTHER): Payer: Self-pay

## 2021-07-24 ENCOUNTER — Other Ambulatory Visit: Payer: Self-pay | Admitting: Internal Medicine

## 2021-07-24 MED ORDER — GABAPENTIN 600 MG PO TABS
ORAL_TABLET | ORAL | 1 refills | Status: DC
  Filled 2021-07-24: qty 180, 90d supply, fill #0
  Filled 2021-10-21: qty 180, 90d supply, fill #1

## 2021-07-28 ENCOUNTER — Other Ambulatory Visit: Payer: Self-pay

## 2021-07-28 ENCOUNTER — Encounter: Payer: Self-pay | Admitting: Neurology

## 2021-07-28 ENCOUNTER — Other Ambulatory Visit (HOSPITAL_COMMUNITY): Payer: Self-pay

## 2021-07-28 ENCOUNTER — Ambulatory Visit (INDEPENDENT_AMBULATORY_CARE_PROVIDER_SITE_OTHER): Payer: 59 | Admitting: Neurology

## 2021-07-28 VITALS — BP 130/74 | Ht 71.0 in | Wt 232.0 lb

## 2021-07-28 DIAGNOSIS — Z6832 Body mass index (BMI) 32.0-32.9, adult: Secondary | ICD-10-CM

## 2021-07-28 DIAGNOSIS — G4752 REM sleep behavior disorder: Secondary | ICD-10-CM

## 2021-07-28 DIAGNOSIS — G4733 Obstructive sleep apnea (adult) (pediatric): Secondary | ICD-10-CM | POA: Diagnosis not present

## 2021-07-28 DIAGNOSIS — G43709 Chronic migraine without aura, not intractable, without status migrainosus: Secondary | ICD-10-CM

## 2021-07-28 NOTE — Progress Notes (Signed)
[facility]  PATIENT: James [name] DOB: December 15, 1955  REFERRING DOCTOR OR PCP:  Kathlene November  SOURCE: patient, notes from Dr. [name], imaging results and MRI and CT scans on PACS personally reviewed  _________________________________   HISTORICAL  CHIEF COMPLAINT:  Chief Complaint  Patient presents with   Procedure    Rm 2, alone. Here for Botox.     HISTORY OF PRESENT ILLNESS:  James [name] is a 66 y.o. man with idiopathic intracranial hypertension and recent increase in the frequency and severity of headache  Update 07/28/2021:  His headaches continue to do very well on Botox.  He has had only a few headaches this month and on the previous 2 months.  When he gets a headache he is usually able to treat it with Excedrin with benefit.   Before Botox he was having daily headaches that were more severe.  Neck pain is also doing better.    When he has a headache, the quality of the pain pain is pressure like, right greater than left.  They are associated with nausea at times.  When the headaches more severe he has photophobia and phonophobia. Sometimes, headaches improve when he is laying down but other times they do not change between sitting and lying down. Moving usually worsens the headaches.  He did not get a benefit from multiple prophylactic medications including Keppra, gabapentin, Emgality, Cymbalta.    He has obstructive sleep apnea but was unable to tolerate CPAP.  We discussed the inspire device and he is potentially interested.  After losing 20 pounds, his BMI is right around 32.  I gave him the web link so he can learn more about the device and to let us know if he is interested.  It would be good for him to lose some more weight.   He has had some RBD with acticve dreams - with some kicking but not getting out of bed.    In early 2022, he had 3 cardiac stents after being diagnosed with CAD after presenting with chest pain.   He had a small MI.    Chronic  Migraine History: He was having daily HA with migrainous features.   He did not get a benefit from multiple prophylactic medications including Keppra, gabapentin, Emgality, Cymbalta.   Botox therapy started July 2021.  Spine: L-spine MRI 08/15/18 showed progressive epidural lipomatosis in the lower lumbar spine with progressive compression of the thecal sac and spinal stenosis at L3-4, L4-5, and L5-S1 compared to the prior study. Progressive facet degeneration and anterolisthesis at L4-5 since 2011.    MRI of the cervical spine performed 04/27/2017 showed  thoracic fusion hardware from C7 and below. There is mild spondylosis and disc bulging at C3-C4 through C6-C7. There does not appear to be any significant foraminal narrowing and there is no spinal stenosis.   Idiopthic intracranial Hypertension: He was diagnosed with idiopathic intracranial hypertension many years ago and had a VP shunt placed in 2007. A revision was performed July 2007 due to infection. He has a programmable Medtronic valve. Due to the increased headaches, it was reprogrammed from 1.5-1.0. Tapping of the shunt showed a pressure 160 (was drained to 140 mm). There was no infection. He also had an MRI of the brain and MR venogram. CT had shown the left-sided ventricular catheter extends into the right caudate head, similar to the previous study the MR venogram (11/12/2016) was reportedly normal. The left transverse and sigmoid sinuses are not well evaluated  due to the shunt reservoir (but also likely he is right dominant).   CT head 3/13 2020 personally reviewed and no evidence of shunt failure        REVIEW OF SYSTEMS: Constitutional: No fevers, chills, sweats, or change in appetite Eyes: No visual changes, double vision, eye pain Ear, nose and throat: No hearing loss, ear pain, nasal congestion, sore throat Cardiovascular: No chest pain, palpitations Respiratory:  No shortness of breath at rest or with exertion.   No  wheezes GastrointestinaI: No nausea, vomiting, diarrhea, abdominal pain, fecal incontinence Genitourinary:  No dysuria, urinary retention or frequency.  No nocturia. Musculoskeletal:  as above Integumentary: psoriasis on Humira Neurological: as above Psychiatric: No depression at this time.  No anxiety Endocrine: No palpitations, diaphoresis, change in appetite, change in weigh or increased thirst Hematologic/Lymphatic:  No anemia, purpura, petechiae. Allergic/Immunologic: No itchy/runny eyes, nasal congestion, recent allergic reactions, rashes  ALLERGIES: Allergies  Allergen Reactions   Bee Venom Anaphylaxis   Hydrocodone Bit-Homatrop Mbr Other (See Comments)    Hallucinations, confusion, delirium Depressed feeling   Toradol [Ketorolac Tromethamine] Other (See Comments)    Hallucinations, confusion, delirium   Sulfadiazine     NDC WEXH:[id number] NDC BOFB:[id number] NDC EUMP:[id number]   Morphine And Related Other (See Comments)    Hallucinations, back in the 80s. States has taken vicodin before w/o problems    Sulfa Drugs Cross Reactors Rash    HOME MEDICATIONS:  Current Outpatient Medications:    Adalimumab (HUMIRA PEN) 40 MG/0.8ML PNKT, INJECT 40 MG (0.8 ML) UNDER THE SKIN EVERY OTHER WEEK., Disp: 2 each, Rfl: 5   amLODipine (NORVASC) 5 MG tablet, Take 1 tablet (5 mg total) by mouth daily., Disp: 90 tablet, Rfl: 2   aspirin EC 81 MG EC tablet, Take 1 tablet (81 mg total) by mouth daily. Swallow whole., Disp: 30 tablet, Rfl: 11   atorvastatin (LIPITOR) 80 MG tablet, Take 1 tablet (80 mg total) by mouth at bedtime., Disp: 90 tablet, Rfl: 1   carvedilol (COREG) 12.5 MG tablet, TAKE 1 TABLET (12.5 MG TOTAL) BY MOUTH 2 (TWO) TIMES DAILY WITH A MEAL. (Patient taking differently: Take 12.5 mg by mouth 2 (two) times daily with a meal.), Disp: 180 tablet, Rfl: 1   clonazePAM (KLONOPIN) 0.5 MG tablet, TAKE 1 TABLET BY MOUTH EVERY NIGHT AT BEDTIME, Disp: 30 tablet, Rfl: 5    clopidogrel (PLAVIX) 75 MG tablet, TAKE 1 TABLET (75 MG TOTAL) BY MOUTH DAILY., Disp: 90 tablet, Rfl: 3   Continuous Blood Gluc Receiver (FREESTYLE LIBRE 2 READER) DEVI, Use to check blood sugar daily (Patient taking differently: 1 each by Other route See admin instructions. Use to check blood sugar daily), Disp: 1 each, Rfl: 0   Continuous Blood Gluc Sensor (FREESTYLE LIBRE 14 DAY SENSOR) MISC, Apply 1 sensor every 14 (fourteen) days. (Patient taking differently: 1 Units by Does not apply route every 14 (fourteen) days. Glucose check), Disp: 2 each, Rfl: 4   DULoxetine (CYMBALTA) 60 MG capsule, TAKE 2 CAPSULES (120 MG TOTAL) BY MOUTH DAILY. (Patient taking differently: Take 60 mg by mouth daily.), Disp: 180 capsule, Rfl: 1   EPINEPHrine (EPIPEN 2-PAK) 0.3 mg/0.3 mL IJ SOAJ injection, Inject 0.3 mLs (0.3 mg total) into the muscle once as needed for up to 1 dose for anaphylaxis. (Patient not taking: Reported on 06/17/2021), Disp: 2 each, Rfl: 2   fenofibrate micronized (LOFIBRA) 134 MG capsule, TAKE 1 CAPSULE BY MOUTH THREE TIMES WEEKLY (Patient taking differently: Take 134 mg by  mouth daily before breakfast.), Disp: 15 capsule, Rfl: 3   gabapentin (NEURONTIN) 600 MG tablet, TAKE 1 TABLET (600 MG TOTAL) BY MOUTH 2 (TWO) TIMES DAILY., Disp: 180 tablet, Rfl: 1   glimepiride (AMARYL) 4 MG tablet, TAKE 1 TABLET BY MOUTH ONCE DAILY BEFORE BREAKFAST (Patient taking differently: Take 4 mg by mouth daily with breakfast.), Disp: 30 tablet, Rfl: 3   icosapent Ethyl (VASCEPA) 1 g capsule, Take 2 capsules (2 g total) by mouth 2 (two) times daily., Disp: 120 capsule, Rfl: 1   Insulin Disposable Pump (OMNIPOD DASH PODS, GEN 4,) MISC, CHANGE EVERY 72 HOURS AS DIRECTED (Patient taking differently: 1 each by Other route See admin instructions. Change every 72 hours), Disp: 10 each, Rfl: 1   insulin lispro (HUMALOG) 100 UNIT/ML injection, Use pump to inject 50 units once daily (Patient taking differently: Inject 50 Units  into the skin daily.), Disp: 50 mL, Rfl: 1   isosorbide mononitrate (IMDUR) 30 MG 24 hr tablet, TAKE 1 TABLET BY MOUTH ONCE DAILY (Patient taking differently: Take 30 mg by mouth daily.), Disp: 90 tablet, Rfl: 1   metFORMIN (GLUCOPHAGE) 500 MG tablet, Take 2 tablets (1,000 mg total) by mouth 2 (two) times daily., Disp: 120 tablet, Rfl: 3   nitroGLYCERIN (NITROSTAT) 0.4 MG SL tablet, Place 1 tablet (0.4 mg total) under the tongue every 5 (five) minutes for 3 doses as needed for chest pain., Disp: 25 tablet, Rfl: 6   pantoprazole (PROTONIX) 40 MG tablet, Take 1 tablet (40 mg total) by mouth daily., Disp: 90 tablet, Rfl: 1   Semaglutide, 2 MG/DOSE, (OZEMPIC, 2 MG/DOSE,) 8 MG/3ML SOPN, Inject 2 mg into the skin weekly (Patient taking differently: Inject 2 mg into the skin once a week. Inject 2 mg into the skin weekly), Disp: 3 mL, Rfl: 2  PAST MEDICAL HISTORY: Past Medical History:  Diagnosis Date   Abnormal cardiac CT angiography    Acid reflux    Annual physical exam 04/08/2015   Arthritis    Atypical chest pain 06/13/2020   Blood transfusion without reported diagnosis    Body mass index (BMI) 35.0-35.9, adult 04/05/2019   Chronic fatigue 01/28/2015   Chronic headaches    on cymbalta   Chronic migraine w/o aura, not intractable, w/o stat migr 10/24/2018   Cirrhosis ([place])    Colon polyps    Complication of anesthesia    problems waking up from anesthesia   Coronary artery disease 01/23/2019   Depression    on cymbalta   Diabetes mellitus with neuropathy ([place])    Diabetes with neuropathy 04/25/2013   Diverticulitis 03/2013   Dyslipidemia 05/29/2019   Eczema    Elevated LFTs    Epidural lipomatosis 10/05/2018   Essential (primary) hypertension 04/05/2019   Essential hypertension 10/10/2019   Fatty liver    GERD (gastroesophageal reflux disease) 04/28/2011   H/O craniotomy 05/07/2015   Headache 04/28/2011   Hepatitis 10/2017   NASH cirrhosis   History of kidney stones    Hyperlipidemia     Hypersomnia with sleep apnea 01/28/2015   Hypertension    IDA (iron deficiency anemia) 01/24/2019   Idiopathic intracranial hypertension 01/14/2017   Insomnia 04/26/2013   Kidney stone    Liver cirrhosis secondary to NASH (nonalcoholic steatohepatitis) ([place]) 01/02/2016   Low back pain 04/05/2019   Lower back injury 08/14/2019   Morbid obesity ([place])    Neuromuscular disorder ([place])    neuropathy   Neuropathy    Nonalcoholic steatohepatitis 7/0/1410   Obstructive  hydrocephalus ([place]) 01/28/2015   OSA -- dx ~ 2012, cpap intolerant 09/04/2014    dx ~ 2012, cpap intolerant    PCP NOTES >>> 04/08/2015   Post-op pain 03/19/2019   Post-traumatic hydrocephalus ([place])    s/p shunts x 2 (first got infected )   Presence of cerebrospinal fluid drainage device 07/28/2011   Psoriasis    sees Dr [name]   Psoriatic arthritis ([place])    REM behavioral disorder 01/14/2017   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   see's Dr.Ortmann   Severe obesity (BMI >= 40) ([place]) 01/28/2015   SI (sacroiliac) joint dysfunction 08/14/2019   Sigmoid diverticulitis 04/25/2013   Sleep apnea    no CPAP      Spondylolisthesis, lumbar region 03/16/2019   Stomach ulcer    Testosterone deficiency 04/28/2011   VP (ventriculoperitoneal) shunt status 07/31/2020    PAST SURGICAL HISTORY: Past Surgical History:  Procedure Laterality Date   BACK SURGERY  1980   BRAIN SURGERY     VP shunts placed in 2007   CHOLECYSTECTOMY N/A 08/25/2017   Procedure: LAPAROSCOPIC CHOLECYSTECTOMY WITH INTRAOPERATIVE CHOLANGIOGRAM;  Surgeon: [name], MD;  Location: [facility];  Service: General;  Laterality: N/A;   COLONOSCOPY     CORONARY STENT INTERVENTION N/A 09/27/2020   Procedure: CORONARY STENT INTERVENTION;  Surgeon: Jettie Booze, MD;  Location: [facility];  Service: Cardiovascular;  Laterality: N/A;   INTRAVASCULAR ULTRASOUND/IVUS N/A 09/27/2020   Procedure: Intravascular Ultrasound/IVUS;  Surgeon: Jettie Booze,  MD;  Location: [facility];  Service: Cardiovascular;  Laterality: N/A;   JOINT REPLACEMENT     total hip   LEFT HEART CATH N/A 09/27/2020   Procedure: Left Heart Cath;  Surgeon: Jettie Booze, MD;  Location: [facility];  Service: Cardiovascular;  Laterality: N/A;   LEFT HEART CATH AND CORONARY ANGIOGRAPHY N/A 09/24/2020   Procedure: LEFT HEART CATH AND CORONARY ANGIOGRAPHY;  Surgeon: Jettie Booze, MD;  Location: [facility];  Service: Cardiovascular;  Laterality: N/A;   LUMBAR FUSION  03/16/2019   SHOULDER SURGERY Left 2010   TOE SURGERY Left 2018   TONSILLECTOMY     as a child   TOTAL HIP ARTHROPLASTY Left 2011   UPPER GASTROINTESTINAL ENDOSCOPY  01/04/2020   VENTRICULOPERITONEAL SHUNT  2007   x2    FAMILY HISTORY: Family History  Problem Relation Age of Onset   Other Mother    Lung cancer Father        alive, former smoker    Heart disease Brother        MI age 61   Other Brother        Murdered   Down syndrome Son    Diabetes Neg Hx    Prostate cancer Neg Hx    Colon cancer Neg Hx    Stomach cancer Neg Hx    Pancreatic cancer Neg Hx    Liver disease Neg Hx     SOCIAL HISTORY:  Social History   Socioeconomic History   Marital status: Married    Spouse name: [name]   Number of children: 2   Years of education: Not on file   Highest education level: Not on file  Occupational History   Occupation: disabled   Tobacco Use   Smoking status: Never   Smokeless tobacco: Never  Vaping Use   Vaping Use: Never used  Substance and Sexual Activity   Alcohol use: No   Drug  use: No   Sexual activity: Yes    Partners: Female  Other Topics Concern   Not on file  Social History Narrative   Household-- pt , wife, one adult son with Down's syndrome   younger son lives in [place] 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   Social Determinants of Health   Financial Resource Strain: Low Risk     Difficulty of Paying Living Expenses: Not hard at all  Food Insecurity: No Food Insecurity   Worried About Charity fundraiser in the Last Year: Never true   [place] in the Last Year: Never true  Transportation Needs: No Transportation Needs   Lack of Transportation (Medical): No   Lack of Transportation (Non-Medical): No  Physical Activity: Inactive   Days of Exercise per Week: 0 days   Minutes of Exercise per Session: 0 min  Stress: No Stress Concern Present   Feeling of Stress : Not at all  Social Connections: Moderately Integrated   Frequency of Communication with Friends and Family: More than three times a week   Frequency of Social Gatherings with Friends and Family: Once a week   Attends Religious Services: Never   Marine scientist or Organizations: Yes   Attends Music therapist: 1 to 4 times per year   Marital Status: Married  Human resources officer Violence: Not At Risk   Fear of Current or Ex-Partner: No   Emotionally Abused: No   Physically Abused: No   Sexually Abused: No     PHYSICAL EXAM Today's Vitals   07/28/21 1751  BP: 130/74  Weight: 232 lb (105.2 kg)  Height: 5' 11"  (1.803 m)   Body mass index is 32.36 kg/m.     General: The patient is well-developed and well-nourished and in no acute distress,     Neck: He has mild tenderness at the occiput.  Good range of motion..    Skin: Extremities are without significant edema.  Neurologic Exam  Mental status: The patient is alert and oriented x 3 at the time of the examination. The patient has apparent normal recent and remote memory, with an apparently normal attention span and concentration ability.   Speech is normal.  Cranial nerves: Extraocular movements are full.  Facial strength and sensation is normal.  Neck strength is normal.  Motor: 5/5 strength  Coordination: Cerebellar testing shows good finger-nose-finger  Gait and station: Station is normal.  The gait is arthritic.   Tandem gait is wide.  Romberg is negative.     DIAGNOSTIC DATA (LABS, IMAGING, TESTING) - I reviewed patient records, labs, notes, testing and imaging myself where available.  Lab Results  Component Value Date   WBC 6.6 06/26/2021   HGB 14.0 06/26/2021   HCT 42.4 06/26/2021   MCV 92.1 06/26/2021   PLT 142.0 (L) 06/26/2021      Component Value Date/Time   NA 138 05/16/2021 0900   NA 139 09/16/2020 1724   K 4.3 05/16/2021 0900   CL 102 05/16/2021 0900   CO2 26 05/16/2021 0900   GLUCOSE 137 (H) 05/16/2021 0900   BUN 21 05/16/2021 0900   BUN 21 09/16/2020 1724   CREATININE 1.53 (H) 05/16/2021 0900   CALCIUM 10.0 05/16/2021 0900   PROT 7.2 06/26/2021 1126   ALBUMIN 4.0 06/26/2021 1126   AST 22 06/26/2021 1126   ALT 38 06/26/2021 1126   ALKPHOS 70 06/26/2021 1126   BILITOT 0.9  06/26/2021 1126   GFRNONAA 52 (L) 09/28/2020 0254   GFRAA 66 08/08/2020 1442   Lab Results  Component Value Date   CHOL 139 03/04/2021   HDL 39.90 03/04/2021   LDLCALC 69 03/04/2021   LDLDIRECT 67.0 12/31/2020   TRIG 151.0 (H) 03/04/2021   CHOLHDL 3 03/04/2021   Lab Results  Component Value Date   HGBA1C 6.8 (H) 03/04/2021   Lab Results  Component Value Date   [id number] 338 06/10/2018   Lab Results  Component Value Date   TSH 1.22 06/26/2021       ASSESSMENT AND PLAN  Chronic migraine w/o aura, not intractable, w/o stat migr  OSA -- dx ~ 2012, cpap intolerant  REM behavioral disorder  BMI 32.0-32.9,adult  1.  Botox 155 units as follows: Frontalis muscle (5 units x 4), corrugators and procerus (5 units x 3), temporalis (5 units x 8), occipitalis (5 units x 4), splenius capitis (12.5 units x 2), splenius cervicis (10 units x 2), C6-C7 paraspinal (5 units x 2), trapezius (12.5 units x 2).  45 units wasted 2.   He has OSA and is unable to tolerate CPAP.  We discussed the inspire device.  BMI is now just over 32.  He would need to lose a few more pounds to qualify. 3.   Continue  clonazepam 0.5 mg for RBD and insomnia 4.   rtc 6 months, sooner if new or worsening issues.  Atina Feeley A. Felecia Shelling, MD, [facility] 09/25/1914, 6:06 PM Certified in Neurology, Clinical Neurophysiology, Sleep Medicine, Pain Medicine and Neuroimaging  [facility] [address], [place], [place] [address] [phone]

## 2021-07-28 NOTE — Progress Notes (Signed)
Botox- 200 units x 1 vial Lot: [id number] Expiration: 02/2024 NDC: 2633-3545-62  Bacteriostatic 0.9% Sodium Chloride- 50m total Lot: [id number]: 01/28/2023 NDC: [address]-11 Dx: GX72.620B/B

## 2021-07-31 ENCOUNTER — Other Ambulatory Visit (HOSPITAL_COMMUNITY): Payer: Self-pay

## 2021-08-03 ENCOUNTER — Other Ambulatory Visit: Payer: Self-pay | Admitting: Internal Medicine

## 2021-08-04 ENCOUNTER — Other Ambulatory Visit (HOSPITAL_BASED_OUTPATIENT_CLINIC_OR_DEPARTMENT_OTHER): Payer: Self-pay

## 2021-08-04 MED ORDER — CARVEDILOL 12.5 MG PO TABS
ORAL_TABLET | Freq: Two times a day (BID) | ORAL | 1 refills | Status: DC
  Filled 2021-08-04: qty 180, 90d supply, fill #0
  Filled 2021-11-19: qty 180, 90d supply, fill #1

## 2021-08-08 ENCOUNTER — Other Ambulatory Visit (HOSPITAL_BASED_OUTPATIENT_CLINIC_OR_DEPARTMENT_OTHER): Payer: Self-pay

## 2021-08-08 DIAGNOSIS — Z20822 Contact with and (suspected) exposure to covid-19: Secondary | ICD-10-CM | POA: Diagnosis not present

## 2021-08-14 ENCOUNTER — Other Ambulatory Visit: Payer: Self-pay | Admitting: Endocrinology

## 2021-08-14 DIAGNOSIS — E1165 Type 2 diabetes mellitus with hyperglycemia: Secondary | ICD-10-CM

## 2021-08-14 DIAGNOSIS — Z794 Long term (current) use of insulin: Secondary | ICD-10-CM

## 2021-08-15 ENCOUNTER — Encounter (HOSPITAL_BASED_OUTPATIENT_CLINIC_OR_DEPARTMENT_OTHER): Payer: Self-pay

## 2021-08-15 ENCOUNTER — Emergency Department (HOSPITAL_BASED_OUTPATIENT_CLINIC_OR_DEPARTMENT_OTHER): Payer: 59

## 2021-08-15 ENCOUNTER — Other Ambulatory Visit: Payer: Self-pay

## 2021-08-15 ENCOUNTER — Other Ambulatory Visit (HOSPITAL_BASED_OUTPATIENT_CLINIC_OR_DEPARTMENT_OTHER): Payer: Self-pay

## 2021-08-15 ENCOUNTER — Inpatient Hospital Stay (HOSPITAL_BASED_OUTPATIENT_CLINIC_OR_DEPARTMENT_OTHER)
Admission: EM | Admit: 2021-08-15 | Discharge: 2021-08-18 | DRG: 287 | Disposition: A | Discharge status: Home / Self Care | Payer: 59 | Attending: Cardiology | Admitting: Cardiology

## 2021-08-15 DIAGNOSIS — Z20822 Contact with and (suspected) exposure to covid-19: Secondary | ICD-10-CM | POA: Diagnosis not present

## 2021-08-15 DIAGNOSIS — Z955 Presence of coronary angioplasty implant and graft: Secondary | ICD-10-CM

## 2021-08-15 DIAGNOSIS — G4700 Insomnia, unspecified: Secondary | ICD-10-CM | POA: Diagnosis not present

## 2021-08-15 DIAGNOSIS — I25110 Atherosclerotic heart disease of native coronary artery with unstable angina pectoris: Principal | ICD-10-CM | POA: Diagnosis present

## 2021-08-15 DIAGNOSIS — E1140 Type 2 diabetes mellitus with diabetic neuropathy, unspecified: Secondary | ICD-10-CM | POA: Diagnosis present

## 2021-08-15 DIAGNOSIS — Z981 Arthrodesis status: Secondary | ICD-10-CM

## 2021-08-15 DIAGNOSIS — E785 Hyperlipidemia, unspecified: Secondary | ICD-10-CM | POA: Diagnosis not present

## 2021-08-15 DIAGNOSIS — Z7984 Long term (current) use of oral hypoglycemic drugs: Secondary | ICD-10-CM | POA: Diagnosis not present

## 2021-08-15 DIAGNOSIS — M1990 Unspecified osteoarthritis, unspecified site: Secondary | ICD-10-CM | POA: Diagnosis present

## 2021-08-15 DIAGNOSIS — K7581 Nonalcoholic steatohepatitis (NASH): Secondary | ICD-10-CM | POA: Diagnosis present

## 2021-08-15 DIAGNOSIS — G4733 Obstructive sleep apnea (adult) (pediatric): Secondary | ICD-10-CM | POA: Diagnosis present

## 2021-08-15 DIAGNOSIS — I10 Essential (primary) hypertension: Secondary | ICD-10-CM | POA: Diagnosis present

## 2021-08-15 DIAGNOSIS — Z9049 Acquired absence of other specified parts of digestive tract: Secondary | ICD-10-CM

## 2021-08-15 DIAGNOSIS — U071 COVID-19: Secondary | ICD-10-CM | POA: Diagnosis not present

## 2021-08-15 DIAGNOSIS — Z7982 Long term (current) use of aspirin: Secondary | ICD-10-CM | POA: Diagnosis not present

## 2021-08-15 DIAGNOSIS — R072 Precordial pain: Secondary | ICD-10-CM | POA: Diagnosis present

## 2021-08-15 DIAGNOSIS — I200 Unstable angina: Secondary | ICD-10-CM | POA: Diagnosis present

## 2021-08-15 DIAGNOSIS — Z79899 Other long term (current) drug therapy: Secondary | ICD-10-CM | POA: Diagnosis not present

## 2021-08-15 DIAGNOSIS — F32A Depression, unspecified: Secondary | ICD-10-CM | POA: Diagnosis present

## 2021-08-15 DIAGNOSIS — E7800 Pure hypercholesterolemia, unspecified: Secondary | ICD-10-CM | POA: Diagnosis not present

## 2021-08-15 DIAGNOSIS — Z87442 Personal history of urinary calculi: Secondary | ICD-10-CM | POA: Diagnosis not present

## 2021-08-15 DIAGNOSIS — I2510 Atherosclerotic heart disease of native coronary artery without angina pectoris: Secondary | ICD-10-CM | POA: Diagnosis not present

## 2021-08-15 DIAGNOSIS — Z91030 Bee allergy status: Secondary | ICD-10-CM

## 2021-08-15 DIAGNOSIS — Z794 Long term (current) use of insulin: Secondary | ICD-10-CM

## 2021-08-15 DIAGNOSIS — G43709 Chronic migraine without aura, not intractable, without status migrainosus: Secondary | ICD-10-CM | POA: Diagnosis present

## 2021-08-15 DIAGNOSIS — L409 Psoriasis, unspecified: Secondary | ICD-10-CM | POA: Diagnosis present

## 2021-08-15 DIAGNOSIS — I25119 Atherosclerotic heart disease of native coronary artery with unspecified angina pectoris: Secondary | ICD-10-CM | POA: Diagnosis not present

## 2021-08-15 DIAGNOSIS — I2583 Coronary atherosclerosis due to lipid rich plaque: Secondary | ICD-10-CM | POA: Diagnosis not present

## 2021-08-15 DIAGNOSIS — Z96642 Presence of left artificial hip joint: Secondary | ICD-10-CM | POA: Diagnosis present

## 2021-08-15 DIAGNOSIS — K219 Gastro-esophageal reflux disease without esophagitis: Secondary | ICD-10-CM | POA: Diagnosis present

## 2021-08-15 DIAGNOSIS — Z7902 Long term (current) use of antithrombotics/antiplatelets: Secondary | ICD-10-CM | POA: Diagnosis not present

## 2021-08-15 DIAGNOSIS — R079 Chest pain, unspecified: Secondary | ICD-10-CM | POA: Diagnosis not present

## 2021-08-15 DIAGNOSIS — Z86010 Personal history of colonic polyps: Secondary | ICD-10-CM | POA: Diagnosis not present

## 2021-08-15 DIAGNOSIS — Z6841 Body Mass Index (BMI) 40.0 and over, adult: Secondary | ICD-10-CM

## 2021-08-15 DIAGNOSIS — Z982 Presence of cerebrospinal fluid drainage device: Secondary | ICD-10-CM

## 2021-08-15 DIAGNOSIS — M4316 Spondylolisthesis, lumbar region: Secondary | ICD-10-CM | POA: Diagnosis present

## 2021-08-15 DIAGNOSIS — Z888 Allergy status to other drugs, medicaments and biological substances status: Secondary | ICD-10-CM

## 2021-08-15 DIAGNOSIS — Z885 Allergy status to narcotic agent status: Secondary | ICD-10-CM

## 2021-08-15 DIAGNOSIS — Z882 Allergy status to sulfonamides status: Secondary | ICD-10-CM

## 2021-08-15 DIAGNOSIS — E1165 Type 2 diabetes mellitus with hyperglycemia: Secondary | ICD-10-CM

## 2021-08-15 LAB — COMPREHENSIVE METABOLIC PANEL
ALT: 28 U/L (ref 0–44)
AST: 24 U/L (ref 15–41)
Albumin: 3.8 g/dL (ref 3.5–5.0)
Alkaline Phosphatase: 65 U/L (ref 38–126)
Anion gap: 8 (ref 5–15)
BUN: 21 mg/dL (ref 8–23)
CO2: 24 mmol/L (ref 22–32)
Calcium: 9.2 mg/dL (ref 8.9–10.3)
Chloride: 104 mmol/L (ref 98–111)
Creatinine, Ser: 1.42 mg/dL — ABNORMAL HIGH (ref 0.61–1.24)
GFR, Estimated: 56 mL/min — ABNORMAL LOW (ref >60)
Glucose, Bld: 144 mg/dL — ABNORMAL HIGH (ref 70–99)
Potassium: 4.4 mmol/L (ref 3.5–5.1)
Sodium: 136 mmol/L (ref 135–145)
Total Bilirubin: 1.1 mg/dL (ref 0.3–1.2)
Total Protein: 7.3 g/dL (ref 6.5–8.1)

## 2021-08-15 LAB — GLUCOSE, CAPILLARY
Glucose-Capillary: 85 mg/dL (ref 70–99)
Glucose-Capillary: 151 mg/dL — ABNORMAL HIGH (ref 70–99)

## 2021-08-15 LAB — CBC WITH DIFFERENTIAL/PLATELET
Abs Immature Granulocytes: 0.02 10*3/uL (ref 0.00–0.07)
Basophils Absolute: 0 10*3/uL (ref 0.0–0.1)
Basophils Relative: 0 %
Eosinophils Absolute: 0.1 10*3/uL (ref 0.0–0.5)
Eosinophils Relative: 2 %
HCT: 38.1 % — ABNORMAL LOW (ref 39.0–52.0)
Hemoglobin: 13 g/dL (ref 13.0–17.0)
Immature Granulocytes: 0 %
Lymphocytes Relative: 26 %
Lymphs Abs: 1.4 10*3/uL (ref 0.7–4.0)
MCH: 30.9 pg (ref 26.0–34.0)
MCHC: 34.1 g/dL (ref 30.0–36.0)
MCV: 90.5 fL (ref 80.0–100.0)
Monocytes Absolute: 0.4 10*3/uL (ref 0.1–1.0)
Monocytes Relative: 7 %
Neutro Abs: 3.3 10*3/uL (ref 1.7–7.7)
Neutrophils Relative %: 65 %
Platelets: 128 10*3/uL — ABNORMAL LOW (ref 150–400)
RBC: 4.21 MIL/uL — ABNORMAL LOW (ref 4.22–5.81)
RDW: 15.4 % (ref 11.5–15.5)
WBC: 5.1 10*3/uL (ref 4.0–10.5)
nRBC: 0 % (ref 0.0–0.2)

## 2021-08-15 LAB — HEPARIN LEVEL (UNFRACTIONATED): Heparin Unfractionated: 0.2 IU/mL — ABNORMAL LOW (ref 0.30–0.70)

## 2021-08-15 LAB — TROPONIN I (HIGH SENSITIVITY)
Troponin I (High Sensitivity): 5 ng/L (ref <18)
Troponin I (High Sensitivity): 5 ng/L (ref <18)
Troponin I (High Sensitivity): 6 ng/L (ref <18)
Troponin I (High Sensitivity): 4 ng/L (ref <18)

## 2021-08-15 LAB — HEMOGLOBIN A1C
Hgb A1c MFr Bld: 6.5 % — ABNORMAL HIGH (ref 4.8–5.6)
Mean Plasma Glucose: 139.85 mg/dL

## 2021-08-15 LAB — RESP PANEL BY RT-PCR (FLU A&B, COVID) ARPGX2
Influenza A by PCR: NEGATIVE
Influenza B by PCR: NEGATIVE
SARS Coronavirus 2 by RT PCR: NEGATIVE

## 2021-08-15 MED ORDER — NITROGLYCERIN 0.4 MG SL SUBL
0.4000 mg | SUBLINGUAL_TABLET | SUBLINGUAL | Status: DC | PRN
  Filled 2021-08-15: qty 1

## 2021-08-15 MED ORDER — HEPARIN (PORCINE) 25000 UT/250ML-% IV SOLN
1400.0000 [IU]/h | INTRAVENOUS | Status: DC
  Administered 2021-08-15: 1200 [IU]/h via INTRAVENOUS
  Administered 2021-08-16 – 2021-08-17 (×2): 1400 [IU]/h via INTRAVENOUS
  Filled 2021-08-15 (×4): qty 250

## 2021-08-15 MED ORDER — ISOSORBIDE MONONITRATE ER 60 MG PO TB24
60.0000 mg | ORAL_TABLET | Freq: Every day | ORAL | Status: DC
  Administered 2021-08-15 – 2021-08-18 (×4): 60 mg via ORAL
  Filled 2021-08-15 (×4): qty 1

## 2021-08-15 MED ORDER — METFORMIN HCL 500 MG PO TABS
1000.0000 mg | ORAL_TABLET | Freq: Two times a day (BID) | ORAL | 3 refills | Status: DC
  Filled 2021-08-15: qty 120, 30d supply, fill #0

## 2021-08-15 MED ORDER — ASPIRIN 81 MG PO CHEW
324.0000 mg | CHEWABLE_TABLET | Freq: Once | ORAL | Status: AC
  Administered 2021-08-15: 324 mg via ORAL
  Filled 2021-08-15: qty 4

## 2021-08-15 MED ORDER — PANTOPRAZOLE SODIUM 40 MG PO TBEC
40.0000 mg | DELAYED_RELEASE_TABLET | Freq: Every day | ORAL | Status: DC
  Administered 2021-08-15 – 2021-08-18 (×4): 40 mg via ORAL
  Filled 2021-08-15 (×4): qty 1

## 2021-08-15 MED ORDER — ICOSAPENT ETHYL 1 G PO CAPS
2.0000 g | ORAL_CAPSULE | Freq: Two times a day (BID) | ORAL | Status: DC
  Administered 2021-08-15 – 2021-08-18 (×6): 2 g via ORAL
  Filled 2021-08-15 (×7): qty 2

## 2021-08-15 MED ORDER — ASPIRIN 81 MG PO CHEW
81.0000 mg | CHEWABLE_TABLET | Freq: Once | ORAL | Status: AC
  Administered 2021-08-15: 81 mg via ORAL
  Filled 2021-08-15: qty 1

## 2021-08-15 MED ORDER — CLONAZEPAM 0.5 MG PO TABS
0.5000 mg | ORAL_TABLET | Freq: Every day | ORAL | Status: DC
  Administered 2021-08-15 – 2021-08-17 (×3): 0.5 mg via ORAL
  Filled 2021-08-15 (×3): qty 1

## 2021-08-15 MED ORDER — GABAPENTIN 600 MG PO TABS
600.0000 mg | ORAL_TABLET | Freq: Two times a day (BID) | ORAL | Status: DC
  Administered 2021-08-15 – 2021-08-18 (×6): 600 mg via ORAL
  Filled 2021-08-15 (×6): qty 1

## 2021-08-15 MED ORDER — AMLODIPINE BESYLATE 5 MG PO TABS
5.0000 mg | ORAL_TABLET | Freq: Every day | ORAL | Status: DC
  Administered 2021-08-15 – 2021-08-18 (×4): 5 mg via ORAL
  Filled 2021-08-15 (×4): qty 1

## 2021-08-15 MED ORDER — SODIUM CHLORIDE 0.9% FLUSH
3.0000 mL | Freq: Two times a day (BID) | INTRAVENOUS | Status: DC
  Administered 2021-08-15 – 2021-08-18 (×5): 3 mL via INTRAVENOUS

## 2021-08-15 MED ORDER — ACETAMINOPHEN 325 MG PO TABS
650.0000 mg | ORAL_TABLET | ORAL | Status: DC | PRN
  Administered 2021-08-16 – 2021-08-17 (×3): 650 mg via ORAL
  Filled 2021-08-15 (×3): qty 2

## 2021-08-15 MED ORDER — INSULIN ASPART 100 UNIT/ML IJ SOLN
0.0000 [IU] | Freq: Three times a day (TID) | INTRAMUSCULAR | Status: DC
  Administered 2021-08-16 (×3): 2 [IU] via SUBCUTANEOUS
  Administered 2021-08-17: 3 [IU] via SUBCUTANEOUS
  Administered 2021-08-17: 2 [IU] via SUBCUTANEOUS

## 2021-08-15 MED ORDER — HEPARIN BOLUS VIA INFUSION
1450.0000 [IU] | Freq: Once | INTRAVENOUS | Status: AC
  Administered 2021-08-15: 1450 [IU] via INTRAVENOUS
  Filled 2021-08-15: qty 1450

## 2021-08-15 MED ORDER — ONDANSETRON HCL 4 MG/2ML IJ SOLN: 4.0000 mg | Freq: Four times a day (QID) | INTRAMUSCULAR | Status: DC | PRN

## 2021-08-15 MED ORDER — DULOXETINE HCL 60 MG PO CPEP
60.0000 mg | ORAL_CAPSULE | Freq: Every day | ORAL | Status: DC
  Administered 2021-08-15 – 2021-08-18 (×4): 60 mg via ORAL
  Filled 2021-08-15 (×4): qty 1

## 2021-08-15 MED ORDER — GLIMEPIRIDE 4 MG PO TABS
4.0000 mg | ORAL_TABLET | Freq: Every day | ORAL | Status: DC
  Administered 2021-08-16 – 2021-08-17 (×2): 4 mg via ORAL
  Filled 2021-08-15 (×3): qty 1

## 2021-08-15 MED ORDER — FENOFIBRATE 160 MG PO TABS
160.0000 mg | ORAL_TABLET | Freq: Every day | ORAL | Status: DC
Start: 2021-08-15 — End: 2021-08-18
  Administered 2021-08-15 – 2021-08-18 (×4): 160 mg via ORAL
  Filled 2021-08-15 (×4): qty 1

## 2021-08-15 MED ORDER — CARVEDILOL 12.5 MG PO TABS
12.5000 mg | ORAL_TABLET | Freq: Two times a day (BID) | ORAL | Status: DC
  Administered 2021-08-16 – 2021-08-17 (×4): 12.5 mg via ORAL
  Filled 2021-08-15 (×4): qty 1

## 2021-08-15 MED ORDER — NITROGLYCERIN 0.4 MG SL SUBL
0.4000 mg | SUBLINGUAL_TABLET | SUBLINGUAL | Status: DC | PRN
  Administered 2021-08-16 (×4): 0.4 mg via SUBLINGUAL
  Filled 2021-08-15 (×2): qty 1

## 2021-08-15 MED ORDER — ATORVASTATIN CALCIUM 80 MG PO TABS
80.0000 mg | ORAL_TABLET | Freq: Every day | ORAL | Status: DC
  Administered 2021-08-15 – 2021-08-18 (×4): 80 mg via ORAL
  Filled 2021-08-15 (×4): qty 1

## 2021-08-15 MED ORDER — METFORMIN HCL 500 MG PO TABS
1000.0000 mg | ORAL_TABLET | Freq: Two times a day (BID) | ORAL | Status: DC
  Administered 2021-08-16: 1000 mg via ORAL
  Filled 2021-08-15: qty 2

## 2021-08-15 MED ORDER — HEPARIN BOLUS VIA INFUSION
4000.0000 [IU] | Freq: Once | INTRAVENOUS | Status: AC
  Administered 2021-08-15: 4000 [IU] via INTRAVENOUS

## 2021-08-15 MED ORDER — CLOPIDOGREL BISULFATE 75 MG PO TABS
75.0000 mg | ORAL_TABLET | Freq: Every day | ORAL | Status: DC
  Administered 2021-08-15 – 2021-08-18 (×4): 75 mg via ORAL
  Filled 2021-08-15 (×4): qty 1

## 2021-08-15 NOTE — ED Triage Notes (Signed)
First contact with patient. Patient arrived via triage with complaints of left sided heavy chest pain 1/10 x this am -Patient has history of stentx1. Pt is A&OX 4. Respirations even/unlabored and speaking in full sentences.

## 2021-08-15 NOTE — ED Notes (Signed)
Care handoff given to RN at [facility]. All questions and concerns addressed.

## 2021-08-15 NOTE — ED Provider Notes (Signed)
Emergency Department Provider Note   I have reviewed the triage vital signs and the nursing notes.   HISTORY  Chief Complaint Chest Pain   HPI James [name] is a 66 y.o. male with past medical history reviewed below including history of CAD, last cath in April 2022, presents to the emergency department with chest heaviness this morning.  He states the pain in his chest is heavy/pressure-like and feels similar to his prior ACS pain.  He states he had brief, intermittent episodes of similar pain in the last 2 weeks but largely ignored it.  This morning he had pain which was more severe and lasted for around 2 hours.  He did not take any nitroglycerin prior to arrival.  He states now his pain is mostly resolved but continues to have some mild residual discomfort.  No shortness of breath or upper respiratory infection symptoms.  He does take most of his medications including Plavix but states he often does not take aspirin.   Past Medical History:  Diagnosis Date   Abnormal cardiac CT angiography    Acid reflux    Annual physical exam 04/08/2015   Arthritis    Atypical chest pain 06/13/2020   Blood transfusion without reported diagnosis    Body mass index (BMI) 35.0-35.9, adult 04/05/2019   Chronic fatigue 01/28/2015   Chronic headaches    on cymbalta   Chronic migraine w/o aura, not intractable, w/o stat migr 10/24/2018   Cirrhosis ([place])    Colon polyps    Complication of anesthesia    problems waking up from anesthesia   Coronary artery disease 01/23/2019   Depression    on cymbalta   Diabetes mellitus with neuropathy ([place])    Diabetes with neuropathy 04/25/2013   Diverticulitis 03/2013   Dyslipidemia 05/29/2019   Eczema    Elevated LFTs    Epidural lipomatosis 10/05/2018   Essential (primary) hypertension 04/05/2019   Essential hypertension 10/10/2019   Fatty liver    GERD (gastroesophageal reflux disease) 04/28/2011   H/O craniotomy 05/07/2015   Headache 04/28/2011    Hepatitis 10/2017   NASH cirrhosis   History of kidney stones    Hyperlipidemia    Hypersomnia with sleep apnea 01/28/2015   Hypertension    IDA (iron deficiency anemia) 01/24/2019   Idiopathic intracranial hypertension 01/14/2017   Insomnia 04/26/2013   Kidney stone    Liver cirrhosis secondary to NASH (nonalcoholic steatohepatitis) ([place]) 01/02/2016   Low back pain 04/05/2019   Lower back injury 08/14/2019   Morbid obesity ([place])    Neuromuscular disorder ([place])    neuropathy   Neuropathy    Nonalcoholic steatohepatitis 10/27/256   Obstructive hydrocephalus ([place]) 01/28/2015   OSA -- dx ~ 2012, cpap intolerant 09/04/2014    dx ~ 2012, cpap intolerant    PCP NOTES >>> 04/08/2015   Post-op pain 03/19/2019   Post-traumatic hydrocephalus ([place])    s/p shunts x 2 (first got infected )   Presence of cerebrospinal fluid drainage device 07/28/2011   Psoriasis    sees Dr [name]   Psoriatic arthritis ([place])    REM behavioral disorder 01/14/2017   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   see's Dr.Ortmann   Severe obesity (BMI >= 40) ([place]) 01/28/2015   SI (sacroiliac) joint dysfunction 08/14/2019   Sigmoid diverticulitis 04/25/2013   Sleep apnea    no CPAP      Spondylolisthesis, lumbar region 03/16/2019   Stomach ulcer    Testosterone deficiency 04/28/2011  VP (ventriculoperitoneal) shunt status 07/31/2020    Review of Systems  Constitutional: No fever/chills Eyes: No visual changes. ENT: No sore throat. Cardiovascular: Positive chest pain. Respiratory: Denies shortness of breath. Gastrointestinal: No abdominal pain.  No nausea, no vomiting.  No diarrhea.  No constipation. Genitourinary: Negative for dysuria. Musculoskeletal: Negative for back pain. Skin: Negative for rash. Neurological: Negative for headaches, focal weakness or numbness.   ____________________________________________   PHYSICAL EXAM:  VITAL SIGNS: ED Triage Vitals  Enc Vitals Group     BP 08/15/21 1112  125/84     Pulse Rate 08/15/21 1112 75     Resp 08/15/21 1112 19     Temp 08/15/21 1112 98.2 F (36.8 C)     Temp Source 08/15/21 1112 Oral     SpO2 08/15/21 1112 95 %     Weight 08/15/21 1109 230 lb (104.3 kg)     Height 08/15/21 1109 5' 11"  (1.803 m)   Constitutional: Alert and oriented. Well appearing and in no acute distress. Eyes: Conjunctivae are normal.  Head: Atraumatic. Nose: No congestion/rhinnorhea. Mouth/Throat: Mucous membranes are moist.  Neck: No stridor.  Cardiovascular: Normal rate, regular rhythm. Good peripheral circulation. Grossly normal heart sounds.   Respiratory: Normal respiratory effort.  No retractions. Lungs CTAB. Gastrointestinal: Soft and nontender. No distention.  Musculoskeletal: No lower extremity tenderness nor edema. No gross deformities of extremities. Neurologic:  Normal speech and language. No gross focal neurologic deficits are appreciated.  Skin:  Skin is warm, dry and intact. No rash noted.   ____________________________________________   LABS (all labs ordered are listed, but only abnormal results are displayed)  Labs Reviewed  COMPREHENSIVE METABOLIC PANEL - Abnormal; Notable for the following components:      Result Value   Glucose, Bld 144 (*)    Creatinine, Ser 1.42 (*)    GFR, Estimated 56 (*)    All other components within normal limits  CBC WITH DIFFERENTIAL/PLATELET - Abnormal; Notable for the following components:   RBC 4.21 (*)    HCT 38.1 (*)    Platelets 128 (*)    All other components within normal limits  HEPARIN LEVEL (UNFRACTIONATED) - Abnormal; Notable for the following components:   Heparin Unfractionated 0.20 (*)    All other components within normal limits  CBC - Abnormal; Notable for the following components:   RBC 3.97 (*)    Hemoglobin 12.0 (*)    HCT 35.9 (*)    Platelets 116 (*)    All other components within normal limits  HEMOGLOBIN A1C - Abnormal; Notable for the following components:   Hgb A1c  MFr Bld 6.5 (*)    All other components within normal limits  BASIC METABOLIC PANEL - Abnormal; Notable for the following components:   Glucose, Bld 146 (*)    Creatinine, Ser 1.37 (*)    GFR, Estimated 58 (*)    All other components within normal limits  GLUCOSE, CAPILLARY - Abnormal; Notable for the following components:   Glucose-Capillary 151 (*)    All other components within normal limits  GLUCOSE, CAPILLARY - Abnormal; Notable for the following components:   Glucose-Capillary 125 (*)    All other components within normal limits  RESP PANEL BY RT-PCR (FLU A&B, COVID) ARPGX2  GLUCOSE, CAPILLARY  HEPARIN LEVEL (UNFRACTIONATED)  LIPID PANEL  TROPONIN I (HIGH SENSITIVITY)  TROPONIN I (HIGH SENSITIVITY)  TROPONIN I (HIGH SENSITIVITY)  TROPONIN I (HIGH SENSITIVITY)   ____________________________________________  EKG   EKG Interpretation  Date/Time:  Friday August 15 2021 11:10:10 EST Ventricular Rate:  76 PR Interval:  122 QRS Duration: 90 QT Interval:  351 QTC Calculation: 395 R Axis:   -2 Text Interpretation: Sinus rhythm Abnormal R-wave progression, early transition Inferior infarct, old Confirmed by Nanda Quinton [phone]) on 08/15/2021 11:12:52 AM        ____________________________________________  RADIOLOGY  DG Chest Portable 1 View  Result Date: 08/15/2021 CLINICAL DATA:  Chest pain. EXAM: PORTABLE CHEST 1 VIEW COMPARISON:  September 17, 2020. FINDINGS: The heart size and mediastinal contours are within normal limits. Both lungs are clear. Harrington rods are noted in the upper thoracic spine. Left-sided ventriculoperitoneal shunt is noted. IMPRESSION: No active disease. Electronically Signed   By: Marijo Conception M.D.   On: 08/15/2021 11:29    ____________________________________________   PROCEDURES  Procedure(s) performed:   Procedures  CRITICAL CARE Performed by: Margette Fast Total critical care time: 35 minutes Critical care time was exclusive of  separately billable procedures and treating other patients. Critical care was necessary to treat or prevent imminent or life-threatening deterioration. Critical care was time spent personally by me on the following activities: development of treatment plan with patient and/or surrogate as well as nursing, discussions with consultants, evaluation of patient's response to treatment, examination of patient, obtaining history from patient or surrogate, ordering and performing treatments and interventions, ordering and review of laboratory studies, ordering and review of radiographic studies, pulse oximetry and re-evaluation of patient's condition.  Nanda Quinton, MD Emergency Medicine  ____________________________________________   INITIAL IMPRESSION / ASSESSMENT AND PLAN / ED COURSE  Pertinent labs & imaging results that were available during my care of the patient were reviewed by me and considered in my medical decision making (see chart for details).   This patient is Presenting for Evaluation of CP, which does require a range of treatment options, and is a complaint that involves a high risk of morbidity and mortality.  The Differential Diagnoses includes all life-threatening causes for chest pain. This includes but is not exclusive to acute coronary syndrome, aortic dissection, pulmonary embolism, cardiac tamponade, community-acquired pneumonia, pericarditis, musculoskeletal chest wall pain, etc.   Critical Interventions- ASA and Nitro   Medications  heparin ADULT infusion 100 units/mL (25000 units/234m) (1,400 Units/hr Intravenous Infusion Verify 08/16/21 0408)  clopidogrel (PLAVIX) tablet 75 mg (75 mg Oral Given 08/15/21 1846)  atorvastatin (LIPITOR) tablet 80 mg (80 mg Oral Given 08/15/21 1846)  isosorbide mononitrate (IMDUR) 24 hr tablet 60 mg (60 mg Oral Given 08/15/21 1846)  amLODipine (NORVASC) tablet 5 mg (5 mg Oral Given 08/15/21 2205)  carvedilol (COREG) tablet 12.5 mg (12.5 mg Oral  Given 08/16/21 0804)  fenofibrate tablet 160 mg (160 mg Oral Given 08/15/21 2205)  icosapent Ethyl (VASCEPA) 1 g capsule 2 g (2 g Oral Given 08/15/21 2205)  DULoxetine (CYMBALTA) DR capsule 60 mg (60 mg Oral Given 08/15/21 2205)  glimepiride (AMARYL) tablet 4 mg (4 mg Oral Given 08/16/21 0803)  metFORMIN (GLUCOPHAGE) tablet 1,000 mg (1,000 mg Oral Given 08/16/21 0804)  pantoprazole (PROTONIX) EC tablet 40 mg (40 mg Oral Given 08/15/21 2205)  clonazePAM (KLONOPIN) tablet 0.5 mg (0.5 mg Oral Given 08/15/21 2205)  gabapentin (NEURONTIN) tablet 600 mg (600 mg Oral Given 08/15/21 2205)  nitroGLYCERIN (NITROSTAT) SL tablet 0.4 mg (0.4 mg Sublingual Given 08/16/21 0653)  acetaminophen (TYLENOL) tablet 650 mg (has no administration in time range)  ondansetron (ZOFRAN) injection 4 mg (has no administration in time range)  sodium chloride flush (NS) 0.9 %  injection 3 mL (3 mLs Intravenous Given 08/15/21 2207)  insulin aspart (novoLOG) injection 0-15 Units (2 Units Subcutaneous Given 08/16/21 0625)  aspirin chewable tablet 324 mg (324 mg Oral Given 08/15/21 1138)  heparin bolus via infusion 4,000 Units (4,000 Units Intravenous Bolus from Bag 08/15/21 1404)  aspirin chewable tablet 81 mg (81 mg Oral Given 08/15/21 1846)  heparin bolus via infusion 1,450 Units (1,450 Units Intravenous Bolus from Bag 08/15/21 1950)    Reassessment after intervention:  patient's pain resolved.    I decided to review pertinent External Data, and in summary last cath in 09/2012 and followed in office by [facility] team.   Clinical Laboratory Tests Ordered, included Troponin negative x 2. COVID and Flu are negative. Creatinine 1.42. Normal electrolytes.   Radiologic Tests Ordered, included CXR. I independently interpreted the images and agree with radiology interpretation.   Cardiac Monitor Tracing which shows NSR.   Social Determinants of Health Risk patient is a non-smoker.   Consult complete with Cardiology, Dr. Harriet Masson. Discussed  case, history, and labs to this point. Agree with plan to start heparin with unstable angina on the differential and can admit to Cardiology service. I have placed temporary bed request at her request. (01:05 PM).   Medical Decision Making: Summary:  Patient presents to the emergency department for evaluation of chest discomfort this morning.  Pain is mostly resolved but continues to have some lingering discomfort.  Plan for full dose aspirin and nitroglycerin.  Some intermittent symptoms over the past 2 weeks but more severe this morning.  Does feel similar to prior ACS.  No STEMI on EKG.   Reevaluation with update and discussion with patient. Remains CP free. Plan for admit.   Disposition: admit  ____________________________________________  FINAL CLINICAL IMPRESSION(S) / ED DIAGNOSES  Final diagnoses:  Unstable angina ([place])     Note:  This document was prepared using Dragon voice recognition software and may include unintentional dictation errors.  Nanda Quinton, MD, [facility] Emergency Medicine    Makaela Cando, [facility], MD 08/16/21 [phone]

## 2021-08-15 NOTE — Progress Notes (Signed)
ANTICOAGULATION CONSULT NOTE - Initial Consult  Pharmacy Consult for heparin Indication: chest pain/ACS  Allergies  Allergen Reactions   Bee Venom Anaphylaxis   Hydrocodone Bit-Homatrop Mbr Other (See Comments)    Hallucinations, confusion, delirium Depressed feeling   Toradol [Ketorolac Tromethamine] Other (See Comments)    Hallucinations, confusion, delirium   Sulfadiazine     NDC GUYQ:[id number] NDC OVFI:[id number] NDC YSAY:[id number]   Morphine And Related Other (See Comments)    Hallucinations, back in the 80s. States has taken vicodin before w/o problems    Sulfa Drugs Cross Reactors Rash    Patient Measurements: Height: 5' 11"  (180.3 cm) Weight: 104.3 kg (230 lb) IBW/kg (Calculated) : 75.3 Heparin Dosing Weight: 97.2kg  Vital Signs: Temp: 98.2 F (36.8 C) (02/17 1112) Temp Source: Oral (02/17 1112) BP: 125/84 (02/17 1112) Pulse Rate: 75 (02/17 1112)  Labs: Recent Labs    08/15/21 1141  HGB 13.0  HCT 38.1*  PLT 128*  CREATININE 1.42*  TROPONINIHS 5    Estimated Creatinine Clearance: 65.4 mL/min (A) (by C-G formula based on SCr of 1.42 mg/dL (H)).   Medical History: Past Medical History:  Diagnosis Date   Abnormal cardiac CT angiography    Acid reflux    Annual physical exam 04/08/2015   Arthritis    Atypical chest pain 06/13/2020   Blood transfusion without reported diagnosis    Body mass index (BMI) 35.0-35.9, adult 04/05/2019   Chronic fatigue 01/28/2015   Chronic headaches    on cymbalta   Chronic migraine w/o aura, not intractable, w/o stat migr 10/24/2018   Cirrhosis ([place])    Colon polyps    Complication of anesthesia    problems waking up from anesthesia   Coronary artery disease 01/23/2019   Depression    on cymbalta   Diabetes mellitus with neuropathy ([place])    Diabetes with neuropathy 04/25/2013   Diverticulitis 03/2013   Dyslipidemia 05/29/2019   Eczema    Elevated LFTs    Epidural lipomatosis 10/05/2018   Essential (primary)  hypertension 04/05/2019   Essential hypertension 10/10/2019   Fatty liver    GERD (gastroesophageal reflux disease) 04/28/2011   H/O craniotomy 05/07/2015   Headache 04/28/2011   Hepatitis 10/2017   NASH cirrhosis   History of kidney stones    Hyperlipidemia    Hypersomnia with sleep apnea 01/28/2015   Hypertension    IDA (iron deficiency anemia) 01/24/2019   Idiopathic intracranial hypertension 01/14/2017   Insomnia 04/26/2013   Kidney stone    Liver cirrhosis secondary to NASH (nonalcoholic steatohepatitis) ([place]) 01/02/2016   Low back pain 04/05/2019   Lower back injury 08/14/2019   Morbid obesity ([place])    Neuromuscular disorder ([place])    neuropathy   Neuropathy    Nonalcoholic steatohepatitis 10/31/7320   Obstructive hydrocephalus ([place]) 01/28/2015   OSA -- dx ~ 2012, cpap intolerant 09/04/2014    dx ~ 2012, cpap intolerant    PCP NOTES >>> 04/08/2015   Post-op pain 03/19/2019   Post-traumatic hydrocephalus ([place])    s/p shunts x 2 (first got infected )   Presence of cerebrospinal fluid drainage device 07/28/2011   Psoriasis    sees Dr [name]   Psoriatic arthritis ([place])    REM behavioral disorder 01/14/2017   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   see's Dr.Ortmann   Severe obesity (BMI >= 40) ([place]) 01/28/2015   SI (sacroiliac) joint dysfunction 08/14/2019   Sigmoid diverticulitis 04/25/2013   Sleep apnea    no CPAP  Spondylolisthesis, lumbar region 03/16/2019   Stomach ulcer    Testosterone deficiency 04/28/2011   VP (ventriculoperitoneal) shunt status 07/31/2020    Medications:  Infusions:   heparin      Assessment: 82 yom presented to the ED with CP. Known history of CAD and now starting IV heparin. Baseline Hgb is WNL but platelets are slightly low. He is not on anticoagulation PTA.   Goal of Therapy:  Heparin level 0.3-0.7 units/ml Monitor platelets by anticoagulation protocol: Yes   Plan:  Heparin bolus 4000 units IV x 1 Heparin gtt 1200 units/hr Check  a 6 hr heparin level Daily heparin level and CBC  Brittanni Cariker, Rande Lawman 08/15/2021,1:15 PM

## 2021-08-15 NOTE — H&P (Signed)
Cardiology Admission History and Physical:   Patient ID: James [name] MRN: [id number]; DOB: 31-Aug-1955   Admission date: 08/15/2021  PCP:  Colon Branch, MD   [facility] Providers Cardiologist:  Jenne Campus, MD        Chief Complaint:  chest pain   Patient Profile:   James [name] is a 66 y.o. male with coronary artery disease status post PCI to the LAD, diabetes mellitus, hypertension, dyslipidemia, nonalcoholic liver cirrhosis who is being seen 08/15/2021 for the evaluation of chest pain.  History of Present Illness:   [name] follows with Dr. Agustin Cree and was last seen by him in October 2022 at that time he appeared to be doing well from a cardiovascular standpoint.  We discussed continuing his dual antiplatelet therapy and no medication changes were made.  The patient was examined at his bedside on the medical floors.  His wife was present during my encounter.  The patient and his wife tells me that he has been experiencing intermittent chest discomfort.  He describes it as a pressure-like sensation.  It would come and go.  He said nothing brought it on.  He notes that he was experiences on exertion and sometimes at rest.  It would last for few minutes he tells me but could not really quantify it resolved.  As time went by he notes that the sensation got worse and worse.  Today he presented to the [facility] to be evaluated for chest pain.  He notes that the same chest pain came today.  Lasted for at least an hour.  He called his wife who directed that he go to the emergency department.  While in the emergency department he tells me that the pain was off and on.  He notes that even after starting a heparin drip he still is experiencing intermittent pain.  He tells me that he had the pain about less than an hour ago.  During my encounter he is pain-free.  Past Medical History:  Diagnosis Date   Abnormal cardiac CT angiography    Acid reflux    Annual physical  exam 04/08/2015   Arthritis    Atypical chest pain 06/13/2020   Blood transfusion without reported diagnosis    Body mass index (BMI) 35.0-35.9, adult 04/05/2019   Chronic fatigue 01/28/2015   Chronic headaches    on cymbalta   Chronic migraine w/o aura, not intractable, w/o stat migr 10/24/2018   Cirrhosis ([place])    Colon polyps    Complication of anesthesia    problems waking up from anesthesia   Coronary artery disease 01/23/2019   Depression    on cymbalta   Diabetes mellitus with neuropathy ([place])    Diabetes with neuropathy 04/25/2013   Diverticulitis 03/2013   Dyslipidemia 05/29/2019   Eczema    Elevated LFTs    Epidural lipomatosis 10/05/2018   Essential (primary) hypertension 04/05/2019   Essential hypertension 10/10/2019   Fatty liver    GERD (gastroesophageal reflux disease) 04/28/2011   H/O craniotomy 05/07/2015   Headache 04/28/2011   Hepatitis 10/2017   NASH cirrhosis   History of kidney stones    Hyperlipidemia    Hypersomnia with sleep apnea 01/28/2015   Hypertension    IDA (iron deficiency anemia) 01/24/2019   Idiopathic intracranial hypertension 01/14/2017   Insomnia 04/26/2013   Kidney stone    Liver cirrhosis secondary to NASH (nonalcoholic steatohepatitis) ([place]) 01/02/2016   Low back pain 04/05/2019   Lower back  injury 08/14/2019   Morbid obesity ([place])    Neuromuscular disorder ([place])    neuropathy   Neuropathy    Nonalcoholic steatohepatitis 0/11/2374   Obstructive hydrocephalus ([place]) 01/28/2015   OSA -- dx ~ 2012, cpap intolerant 09/04/2014    dx ~ 2012, cpap intolerant    PCP NOTES >>> 04/08/2015   Post-op pain 03/19/2019   Post-traumatic hydrocephalus ([place])    s/p shunts x 2 (first got infected )   Presence of cerebrospinal fluid drainage device 07/28/2011   Psoriasis    sees Dr [name]   Psoriatic arthritis ([place])    REM behavioral disorder 01/14/2017   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   see's Dr.Ortmann   Severe obesity (BMI >= 40) ([place])  01/28/2015   SI (sacroiliac) joint dysfunction 08/14/2019   Sigmoid diverticulitis 04/25/2013   Sleep apnea    no CPAP      Spondylolisthesis, lumbar region 03/16/2019   Stomach ulcer    Testosterone deficiency 04/28/2011   VP (ventriculoperitoneal) shunt status 07/31/2020    Past Surgical History:  Procedure Laterality Date   [place]     VP shunts placed in 2007   CHOLECYSTECTOMY N/A 08/25/2017   Procedure: LAPAROSCOPIC CHOLECYSTECTOMY WITH INTRAOPERATIVE CHOLANGIOGRAM;  Surgeon: [name], MD;  Location: [facility];  Service: General;  Laterality: N/A;   COLONOSCOPY     CORONARY STENT INTERVENTION N/A 09/27/2020   Procedure: CORONARY STENT INTERVENTION;  Surgeon: Jettie Booze, MD;  Location: [facility];  Service: Cardiovascular;  Laterality: N/A;   INTRAVASCULAR ULTRASOUND/IVUS N/A 09/27/2020   Procedure: Intravascular Ultrasound/IVUS;  Surgeon: Jettie Booze, MD;  Location: [facility];  Service: Cardiovascular;  Laterality: N/A;   JOINT REPLACEMENT     total hip   LEFT HEART CATH N/A 09/27/2020   Procedure: Left Heart Cath;  Surgeon: Jettie Booze, MD;  Location: [facility];  Service: Cardiovascular;  Laterality: N/A;   LEFT HEART CATH AND CORONARY ANGIOGRAPHY N/A 09/24/2020   Procedure: LEFT HEART CATH AND CORONARY ANGIOGRAPHY;  Surgeon: Jettie Booze, MD;  Location: [facility];  Service: Cardiovascular;  Laterality: N/A;   LUMBAR FUSION  03/16/2019   SHOULDER SURGERY Left 2010   TOE SURGERY Left 2018   TONSILLECTOMY     as a child   TOTAL HIP ARTHROPLASTY Left 2011   UPPER GASTROINTESTINAL ENDOSCOPY  01/04/2020   VENTRICULOPERITONEAL SHUNT  2007   x2     Medications Prior to Admission: Prior to Admission medications   Medication Sig Start Date End Date Taking? Authorizing Provider  Adalimumab (HUMIRA PEN) 40 MG/0.8ML PNKT INJECT 40 MG (0.8 ML) UNDER THE SKIN EVERY OTHER WEEK. Patient taking  differently: Inject 40 mg into the skin every 14 (fourteen) days. 06/06/21   Tresa Garter, MD  amLODipine (NORVASC) 5 MG tablet Take 1 tablet (5 mg total) by mouth daily. 03/21/21   Park Liter, MD  aspirin EC 81 MG EC tablet Take 1 tablet (81 mg total) by mouth daily. Swallow whole. 09/29/20   Strader, Fransisco Hertz, PA-C  atorvastatin (LIPITOR) 80 MG tablet Take 1 tablet (80 mg total) by mouth at bedtime. 12/25/20   Colon Branch, MD  carvedilol (COREG) 12.5 MG tablet TAKE 1 TABLET (12.5 MG TOTAL) BY MOUTH 2 (TWO) TIMES DAILY WITH A MEAL. Patient taking differently: Take 12.5 mg by mouth 2 (two) times daily with a meal. 08/04/21 08/04/22  Paz, Jacqulyn Bath E, MD  clonazePAM (KLONOPIN) 0.5 MG tablet TAKE 1 TABLET BY MOUTH EVERY NIGHT AT BEDTIME Patient taking differently: Take 0.5 mg by mouth at bedtime. 06/17/21 12/14/21  Melvenia Beam, MD  clopidogrel (PLAVIX) 75 MG tablet TAKE 1 TABLET (75 MG TOTAL) BY MOUTH DAILY. 12/25/20 12/25/21  Park Liter, MD  Continuous Blood Gluc Receiver (FREESTYLE LIBRE 2 READER) DEVI Use to check blood sugar daily Patient taking differently: 1 each by Other route See admin instructions. Use to check blood sugar daily 03/20/21   Elayne Snare, MD  Continuous Blood Gluc Sensor (FREESTYLE LIBRE 14 DAY SENSOR) MISC Apply 1 sensor every 14 (fourteen) days. Patient taking differently: 1 Units by Does not apply route every 14 (fourteen) days. Glucose check 03/20/21   Elayne Snare, MD  DULoxetine (CYMBALTA) 60 MG capsule TAKE 2 CAPSULES (120 MG TOTAL) BY MOUTH DAILY. Patient taking differently: Take 60 mg by mouth daily. 03/21/21   Colon Branch, MD  EPINEPHrine (EPIPEN 2-PAK) 0.3 mg/0.3 mL IJ SOAJ injection Inject 0.3 mLs (0.3 mg total) into the muscle once as needed for up to 1 dose for anaphylaxis. 01/10/20   Colon Branch, MD  fenofibrate micronized (LOFIBRA) 134 MG capsule TAKE 1 CAPSULE BY MOUTH THREE TIMES WEEKLY Patient taking differently: Take 134 mg by mouth daily before  breakfast. 02/14/21 02/14/22  Elayne Snare, MD  gabapentin (NEURONTIN) 600 MG tablet TAKE 1 TABLET (600 MG TOTAL) BY MOUTH 2 (TWO) TIMES DAILY. Patient taking differently: Take 600 mg by mouth 2 (two) times daily. 07/24/21 07/24/22  Colon Branch, MD  glimepiride (AMARYL) 4 MG tablet TAKE 1 TABLET BY MOUTH ONCE DAILY BEFORE BREAKFAST Patient taking differently: Take 4 mg by mouth daily with breakfast. 02/01/20 04/18/21  Elayne Snare, MD  icosapent Ethyl (VASCEPA) 1 g capsule Take 2 capsules (2 g total) by mouth 2 (two) times daily. 10/31/20   Elayne Snare, MD  Insulin Disposable Pump (OMNIPOD DASH PODS, GEN 4,) MISC CHANGE EVERY 72 HOURS AS DIRECTED Patient taking differently: 1 each by Other route See admin instructions. Change every 72 hours 02/19/21 02/19/22  Elayne Snare, MD  insulin lispro (HUMALOG) 100 UNIT/ML injection Use pump to inject 50 units once daily Patient taking differently: Inject 50 Units into the skin daily. 05/02/20 05/02/21  Elayne Snare, MD  isosorbide mononitrate (IMDUR) 30 MG 24 hr tablet TAKE 1 TABLET BY MOUTH ONCE DAILY Patient taking differently: Take 30 mg by mouth daily. 03/21/21 03/21/22  Park Liter, MD  metFORMIN (GLUCOPHAGE) 500 MG tablet Take 2 tablets (1,000 mg total) by mouth 2 (two) times daily. 08/15/21   Elayne Snare, MD  nitroGLYCERIN (NITROSTAT) 0.4 MG SL tablet Place 1 tablet (0.4 mg total) under the tongue every 5 (five) minutes for 3 doses as needed for chest pain. 06/26/21   Colon Branch, MD  pantoprazole (PROTONIX) 40 MG tablet Take 1 tablet (40 mg total) by mouth daily. 06/17/21   Colon Branch, MD  Semaglutide, 2 MG/DOSE, (OZEMPIC, 2 MG/DOSE,) 8 MG/3ML SOPN Inject 2 mg into the skin weekly Patient taking differently: Inject 2 mg into the skin once a week. Inject 2 mg into the skin weekly 03/20/21   Elayne Snare, MD     Allergies:    Allergies  Allergen Reactions   Bee Venom Anaphylaxis   Hydrocodone Bit-Homatrop Mbr Other (See Comments)    Hallucinations,  confusion, delirium Depressed feeling   Toradol [Ketorolac Tromethamine] Other (See Comments)    Hallucinations,  confusion, delirium   Sulfadiazine     NDC XBWI:[id number] NDC AGTX:[id number] NDC [id number]:[id number]   Morphine And Related Other (See Comments)    Hallucinations, back in the 80s. States has taken vicodin before w/o problems    Sulfa Drugs Cross Reactors Rash    Social History:   Social History   Socioeconomic History   Marital status: Married    Spouse name: [name]   Number of children: 2   Years of education: Not on file   Highest education level: Not on file  Occupational History   Occupation: disabled   Tobacco Use   Smoking status: Never   Smokeless tobacco: Never  Vaping Use   Vaping Use: Never used  Substance and Sexual Activity   Alcohol use: No   Drug use: No   Sexual activity: Yes    Partners: Female  Other Topics Concern   Not on file  Social History Narrative   Household-- pt , wife, one adult son with Down's syndrome   younger son lives in [place] 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   Social Determinants of Health   Financial Resource Strain: Low Risk    Difficulty of Paying Living Expenses: Not hard at all  Food Insecurity: No Food Insecurity   Worried About Charity fundraiser in the Last Year: Never true   [place] in the Last Year: Never true  Transportation Needs: No Transportation Needs   Lack of Transportation (Medical): No   Lack of Transportation (Non-Medical): No  Physical Activity: Inactive   Days of Exercise per Week: 0 days   Minutes of Exercise per Session: 0 min  Stress: No Stress Concern Present   Feeling of Stress : Not at all  Social Connections: Moderately Integrated   Frequency of Communication with Friends and Family: More than three times a week   Frequency of Social Gatherings with Friends and Family: Once a week   Attends Religious Services: Never   Building surveyor or Organizations: Yes   Attends Music therapist: 1 to 4 times per year   Marital Status: Married  Human resources officer Violence: Not At Risk   Fear of Current or Ex-Partner: No   Emotionally Abused: No   Physically Abused: No   Sexually Abused: No    Family History:   The patient's family history includes Down syndrome in his son; Heart disease in his brother; Lung cancer in his father; Other in his brother and mother. There is no history of Diabetes, Prostate cancer, Colon cancer, Stomach cancer, Pancreatic cancer, or Liver disease.    ROS:  Review of Systems  Constitution: Negative for decreased appetite, fever and weight gain.  HENT: Negative for congestion, ear discharge, hoarse voice and sore throat.   Eyes: Negative for discharge, redness, vision loss in right eye and visual halos.  Cardiovascular: Reports chest pain.  Negative for dyspnea on exertion, leg swelling, orthopnea and palpitations.  Respiratory: Negative for cough, hemoptysis, shortness of breath and snoring.   Endocrine: Negative for heat intolerance and polyphagia.  Hematologic/Lymphatic: Negative for bleeding problem. Does not bruise/bleed easily.  Skin: Negative for flushing, nail changes, rash and suspicious lesions.  Musculoskeletal: Negative for arthritis, joint pain, muscle cramps, myalgias, neck pain and stiffness.  Gastrointestinal: Negative for abdominal pain, bowel incontinence, diarrhea and excessive appetite.  Genitourinary: Negative for decreased libido, genital sores and incomplete emptying.  Neurological: Negative for brief paralysis, focal  weakness, headaches and loss of balance.  Psychiatric/Behavioral: Negative for altered mental status, depression and suicidal ideas.  Allergic/Immunologic: Negative for HIV exposure and persistent infections.   Physical Exam/Data:   Vitals:   08/15/21 1112 08/15/21 1400 08/15/21 1500 08/15/21 1545  BP: 125/84 (!) 137/92 134/87 (!) 149/87   Pulse: 75 71 66 70  Resp: 19 18 18  (!) 21  Temp: 98.2 F (36.8 C)     TempSrc: Oral     SpO2: 95% 99% 98% 95%  Weight:      Height:        Intake/Output Summary (Last 24 hours) at 08/15/2021 1621 Last data filed at 08/15/2021 1424 Gross per 24 hour  Intake 0 ml  Output 500 ml  Net -500 ml   Last 3 Weights 08/15/2021 07/28/2021 06/26/2021  Weight (lbs) 230 lb 232 lb 237 lb 9.6 oz  Weight (kg) 104.327 kg 105.235 kg 107.775 kg     Body mass index is 32.08 kg/m.  General:  Well nourished, well developed, in no acute distress HEENT: normal Neck: no JVD Vascular: No carotid bruits; Distal pulses 2+ bilaterally   Cardiac:  normal S1, S2; RRR; no murmur  Lungs:  clear to auscultation bilaterally, no wheezing, rhonchi or rales  Abd: soft, nontender, no hepatomegaly  Ext: no edema Musculoskeletal:  No deformities, BUE and BLE strength normal and equal Skin: warm and dry  Neuro:  CNs 2-12 intact, no focal abnormalities noted Psych:  Normal affect    EKG:  The ECG that was done was was personally reviewed and demonstrates sinus rhythm heart rate 73-minute with changes suggesting old inferior wall infarction compared to prior EKG no significant change.  Relevant CV Studies:  LHC/PCI 09/27/2020 Ost LAD to Prox LAD lesion is 50% stenosed. This did not appear to be significant by IVUS during pullback. 1st Diag lesion is 75% stenosed. 2nd Diag lesion is 75% stenosed. Mid Cx lesion is 75% stenosed. Mid LAD lesion is 99% stenosed. This appeared to be a chronic total occlusion. A drug-eluting stent was successfully placed using a SYNERGY XD 2.50X24, postdilated to 3.0 mm and optimized with intravascular ultrasound. Post intervention, there is a 0% residual stenosis. LV end diastolic pressure is mildly elevated. There is no aortic valve stenosis.   Continue aggressive secondary prevention including dual antiplatelet therapy.  Consider clopidogrel monotherapy given his diffuse disease.     LHC 09/24/2021  Ost LAD to Prox LAD lesion is 50% stenosed. 1st Diag lesion is 75% stenosed. 2nd Diag lesion is 75% stenosed. Mid LAD lesion is 99% stenosed. Mid Cx lesion is 75% stenosed. RPAV lesion is 50% stenosed. RPDA lesion is 25% stenosed. The left ventricular systolic function is normal. LV end diastolic pressure is normal. The left ventricular ejection fraction is 55-65% by visual estimate. There is no aortic valve stenosis.   Plan for staged PCI of the mid LAD.  The diagonal vessels and circumflex are very small and will be managed medically.  Will need to use groin approach given the tortuosity of his right subclavian.  Start clopidogrel 300 mg x 1 today followed by 75 mg daily   Echo 01/24/2019 IMPRESSIONS     1. The left ventricle has normal systolic function with an ejection  fraction of 60-65%. The cavity size was normal. There is moderately  increased left ventricular wall thickness. Left ventricular diastolic  parameters were normal.   2. The right ventricle has normal systolic function. The cavity was  normal. There is no  increase in right ventricular wall thickness.   3. The aorta is normal in size and structure.   FINDINGS   Left Ventricle: The left ventricle has normal systolic function, with an  ejection fraction of 60-65%. The cavity size was normal. There is  moderately increased left ventricular wall thickness. Left ventricular  diastolic parameters were normal.   Right Ventricle: The right ventricle has normal systolic function. The  cavity was normal. There is no increase in right ventricular wall  thickness.   Left Atrium: Left atrial size was normal in size.   Right Atrium: Right atrial size was normal in size. Right atrial pressure  is estimated at 3 mmHg.   Interatrial Septum: No atrial level shunt detected by color flow Doppler.   Pericardium: There is no evidence of pericardial effusion.   Mitral Valve: The mitral valve is normal in  structure. Mitral valve  regurgitation was not assessed by color flow Doppler.   Tricuspid Valve: The tricuspid valve is normal in structure. Tricuspid  valve regurgitation was not visualized by color flow Doppler.   Aortic Valve: The aortic valve is normal in structure. Aortic valve  regurgitation was not assessed by color flow Doppler.   Pulmonic Valve: The pulmonic valve was normal in structure. Pulmonic valve  regurgitation was not assessed by color flow Doppler.   Aorta: The aorta is normal in size and structure.   Venous: The inferior vena cava measures 1.21 cm, is normal in size with  greater than 50% respiratory variability.   Laboratory Data:  High Sensitivity Troponin:   Recent Labs  Lab 08/15/21 1141 08/15/21 1359  TROPONINIHS 5 5      Chemistry Recent Labs  Lab 08/15/21 1141  NA 136  K 4.4  CL 104  CO2 24  GLUCOSE 144*  BUN 21  CREATININE 1.42*  CALCIUM 9.2  GFRNONAA 56*  ANIONGAP 8    Recent Labs  Lab 08/15/21 1141  PROT 7.3  ALBUMIN 3.8  AST 24  ALT 28  ALKPHOS 65  BILITOT 1.1   Lipids No results for input(s): CHOL, TRIG, HDL, LABVLDL, LDLCALC, CHOLHDL in the last 168 hours. Hematology Recent Labs  Lab 08/15/21 1141  WBC 5.1  RBC 4.21*  HGB 13.0  HCT 38.1*  MCV 90.5  MCH 30.9  MCHC 34.1  RDW 15.4  PLT 128*   Thyroid No results for input(s): TSH, FREET4 in the last 168 hours. BNPNo results for input(s): BNP, PROBNP in the last 168 hours.  DDimer No results for input(s): DDIMER in the last 168 hours.   Radiology/Studies:  DG Chest Portable 1 View  Result Date: 08/15/2021 CLINICAL DATA:  Chest pain. EXAM: PORTABLE CHEST 1 VIEW COMPARISON:  September 17, 2020. FINDINGS: The heart size and mediastinal contours are within normal limits. Both lungs are clear. Harrington rods are noted in the upper thoracic spine. Left-sided ventriculoperitoneal shunt is noted. IMPRESSION: No active disease. Electronically Signed   By: Marijo Conception M.D.    On: 08/15/2021 11:29     Assessment and Plan:   Unstable Angina with hx of CAD s/p PCI to LAD Diabetes mellitus type 2 Hypertension Hyperlipidemia Obesity  He still is experiencing intermittent chest discomfort.  He has been started on heparin drip we will continue this for now.  I have discussed with the patient and his wife the best step forward given his significantly high risk for progression of coronary artery disease is pursuing a left heart catheterization which can be done  early next week.  He is agreeable for the cardiac catheterization. In the meantime we will continue medical therapy with heparin drip, aspirin 81 mg daily, Plavix 75 mg daily, will increase his Imdur to 60 mg daily, continue his beta-blocker as well as his Lipitor 80 mg daily.  We will resume his medication for diabetes including sliding scale.  Hyperlipidemia - continue with current statin medication.  Hypertension-blood pressure slightly elevated at the time of my evaluation, we will continue to monitor and optimize his antihypertensive therapy as appropriate.  Nonalcoholic liver cirrhosis-no signs of right heart failure appears to be stable.  DVT prophylaxis-on heparin drip CODE STATUS-full code Will be admitted for greater than 2 midnight stay, requiring IV heparin   Risk Assessment/Risk Scores:    HEAR Score (for undifferentiated chest pain):      Severity of Illness: The appropriate patient status for this patient is INPATIENT. Inpatient status is judged to be reasonable and necessary in order to provide the required intensity of service to ensure the patient's safety. The patient's presenting symptoms, physical exam findings, and initial radiographic and laboratory data in the context of their chronic comorbidities is felt to place them at high risk for further clinical deterioration. Furthermore, it is not anticipated that the patient will be medically stable for discharge from the hospital within 2  midnights of admission.   * I certify that at the point of admission it is my clinical judgment that the patient will require inpatient hospital care spanning beyond 2 midnights from the point of admission due to high intensity of service, high risk for further deterioration and high frequency of surveillance required.*   For questions or updates, please contact [facility] Please consult [email or web address] for contact info under     Signed, Berniece Salines, DO  08/15/2021 4:21 PM

## 2021-08-15 NOTE — ED Notes (Signed)
Pt report given to [facility]. Pt and wife updated on POC.

## 2021-08-15 NOTE — Progress Notes (Signed)
ANTICOAGULATION CONSULT NOTE - follow-up  Pharmacy Consult for heparin Indication: chest pain/ACS  Allergies  Allergen Reactions   Bee Venom Anaphylaxis   Hydrocodone Bit-Homatrop Mbr Other (See Comments)    Hallucinations, confusion, delirium Depressed feeling   Toradol [Ketorolac Tromethamine] Other (See Comments)    Hallucinations, confusion, delirium   Sulfadiazine     NDC GEZM:[id number] NDC [id number] NDC FVCB:[id number]   Morphine And Related Other (See Comments)    Hallucinations, back in the 80s. States has taken vicodin before w/o problems    Sulfa Drugs Cross Reactors Rash    Patient Measurements: Height: 5' 11"  (180.3 cm) Weight: 105.9 kg (233 lb 7.5 oz) IBW/kg (Calculated) : 75.3 Heparin Dosing Weight: 97.2kg  Vital Signs: Temp: 97.8 F (36.6 C) (02/17 1701) Temp Source: Oral (02/17 1701) BP: 147/89 (02/17 1701) Pulse Rate: 76 (02/17 1701)  Labs: Recent Labs    08/15/21 1141 08/15/21 1359 08/15/21 1822  HGB 13.0  --   --   HCT 38.1*  --   --   PLT 128*  --   --   HEPARINUNFRC  --   --  0.20*  CREATININE 1.42*  --   --   TROPONINIHS 5 5  --      Estimated Creatinine Clearance: 65.9 mL/min (A) (by C-G formula based on SCr of 1.42 mg/dL (H)).   Medical History: Past Medical History:  Diagnosis Date   Abnormal cardiac CT angiography    Acid reflux    Annual physical exam 04/08/2015   Arthritis    Atypical chest pain 06/13/2020   Blood transfusion without reported diagnosis    Body mass index (BMI) 35.0-35.9, adult 04/05/2019   Chronic fatigue 01/28/2015   Chronic headaches    on cymbalta   Chronic migraine w/o aura, not intractable, w/o stat migr 10/24/2018   Cirrhosis ([place])    Colon polyps    Complication of anesthesia    problems waking up from anesthesia   Coronary artery disease 01/23/2019   Depression    on cymbalta   Diabetes mellitus with neuropathy ([place])    Diabetes with neuropathy 04/25/2013   Diverticulitis 03/2013    Dyslipidemia 05/29/2019   Eczema    Elevated LFTs    Epidural lipomatosis 10/05/2018   Essential (primary) hypertension 04/05/2019   Essential hypertension 10/10/2019   Fatty liver    GERD (gastroesophageal reflux disease) 04/28/2011   H/O craniotomy 05/07/2015   Headache 04/28/2011   Hepatitis 10/2017   NASH cirrhosis   History of kidney stones    Hyperlipidemia    Hypersomnia with sleep apnea 01/28/2015   Hypertension    IDA (iron deficiency anemia) 01/24/2019   Idiopathic intracranial hypertension 01/14/2017   Insomnia 04/26/2013   Kidney stone    Liver cirrhosis secondary to NASH (nonalcoholic steatohepatitis) ([place]) 01/02/2016   Low back pain 04/05/2019   Lower back injury 08/14/2019   Morbid obesity ([place])    Neuromuscular disorder ([place])    neuropathy   Neuropathy    Nonalcoholic steatohepatitis 10/02/6597   Obstructive hydrocephalus ([place]) 01/28/2015   OSA -- dx ~ 2012, cpap intolerant 09/04/2014    dx ~ 2012, cpap intolerant    PCP NOTES >>> 04/08/2015   Post-op pain 03/19/2019   Post-traumatic hydrocephalus (HCC)    s/p shunts x 2 (first got infected )   Presence of cerebrospinal fluid drainage device 07/28/2011   Psoriasis    sees Dr [name]   Psoriatic arthritis ([place])    REM behavioral disorder 01/14/2017  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   see's Dr.Ortmann   Severe obesity (BMI >= 40) (HCC) 01/28/2015   SI (sacroiliac) joint dysfunction 08/14/2019   Sigmoid diverticulitis 04/25/2013   Sleep apnea    no CPAP      Spondylolisthesis, lumbar region 03/16/2019   Stomach ulcer    Testosterone deficiency 04/28/2011   VP (ventriculoperitoneal) shunt status 07/31/2020    Medications:  Infusions:   heparin 1,200 Units/hr (08/15/21 1403)    Assessment: 78 yom presented to the ED with CP. Known history of CAD and now starting IV heparin. Baseline Hgb is WNL but platelets are slightly low. He is not on anticoagulation PTA.   2/17 @ 1822: HL 0.2  Goal of Therapy:   Heparin level 0.3-0.7 units/ml Monitor platelets by anticoagulation protocol: Yes   Plan:  Heparin bolus 1450 units IV x 1 Increase heparin infusion to 1400 units/hr Check a heparin level in 7 hours (AM lab draw) Daily heparin level and CBC  Jameon Deller BS, PharmD, BCPS Clinical Pharmacist 08/15/2021,7:41 PM

## 2021-08-16 DIAGNOSIS — R072 Precordial pain: Secondary | ICD-10-CM

## 2021-08-16 LAB — HEPARIN LEVEL (UNFRACTIONATED)
Heparin Unfractionated: 0.38 IU/mL (ref 0.30–0.70)
Heparin Unfractionated: 0.49 IU/mL (ref 0.30–0.70)

## 2021-08-16 LAB — BASIC METABOLIC PANEL
Anion gap: 11 (ref 5–15)
BUN: 19 mg/dL (ref 8–23)
CO2: 24 mmol/L (ref 22–32)
Calcium: 9 mg/dL (ref 8.9–10.3)
Chloride: 103 mmol/L (ref 98–111)
Creatinine, Ser: 1.37 mg/dL — ABNORMAL HIGH (ref 0.61–1.24)
GFR, Estimated: 58 mL/min — ABNORMAL LOW (ref >60)
Glucose, Bld: 146 mg/dL — ABNORMAL HIGH (ref 70–99)
Potassium: 3.9 mmol/L (ref 3.5–5.1)
Sodium: 138 mmol/L (ref 135–145)

## 2021-08-16 LAB — CBC
HCT: 35.9 % — ABNORMAL LOW (ref 39.0–52.0)
Hemoglobin: 12 g/dL — ABNORMAL LOW (ref 13.0–17.0)
MCH: 30.2 pg (ref 26.0–34.0)
MCHC: 33.4 g/dL (ref 30.0–36.0)
MCV: 90.4 fL (ref 80.0–100.0)
Platelets: 116 10*3/uL — ABNORMAL LOW (ref 150–400)
RBC: 3.97 MIL/uL — ABNORMAL LOW (ref 4.22–5.81)
RDW: 15.4 % (ref 11.5–15.5)
WBC: 4.8 10*3/uL (ref 4.0–10.5)
nRBC: 0 % (ref 0.0–0.2)

## 2021-08-16 LAB — LIPID PANEL
Cholesterol: 118 mg/dL (ref 0–200)
HDL: 43 mg/dL (ref >40)
LDL Cholesterol: 55 mg/dL (ref 0–99)
Total CHOL/HDL Ratio: 2.7 RATIO
Triglycerides: 101 mg/dL (ref <150)
VLDL: 20 mg/dL (ref 0–40)

## 2021-08-16 LAB — GLUCOSE, CAPILLARY
Glucose-Capillary: 125 mg/dL — ABNORMAL HIGH (ref 70–99)
Glucose-Capillary: 127 mg/dL — ABNORMAL HIGH (ref 70–99)
Glucose-Capillary: 130 mg/dL — ABNORMAL HIGH (ref 70–99)
Glucose-Capillary: 101 mg/dL — ABNORMAL HIGH (ref 70–99)

## 2021-08-16 NOTE — Progress Notes (Signed)
°   08/16/21 0652  Vitals  BP 123/89  MAP (mmHg) 99  BP Location Left Arm  BP Method Automatic  Patient Position (if appropriate) Lying  ECG Heart Rate 65  Resp 20  MEWS COLOR  MEWS Score Color Green  Oxygen Therapy  SpO2 100 %  O2 Device Nasal Cannula  O2 Flow Rate (L/min) 2 L/min  MEWS Score  MEWS Temp 0  MEWS Systolic 0  MEWS Pulse 0  MEWS RR 0  MEWS LOC 0  MEWS Score 0   Patient reporting 3/10 chest pain at rest. Chest pain protocol initiated.

## 2021-08-16 NOTE — Progress Notes (Signed)
°  Transition of Care [facility]) Screening Note   Patient Details  Name: James [name] Date of Birth: [birth year]   Transition of Care [facility]) CM/SW Contact:    Bary Castilla, LCSW Phone Number: 08/16/2021, 4:26 PM    Transition of Care Department [facility]) has reviewed patient and no TOC needs have been identified at this time. We will continue to monitor patient advancement through interdisciplinary progression rounds. If new patient transition needs arise, please place a TOC consult.

## 2021-08-16 NOTE — Progress Notes (Signed)
ANTICOAGULATION CONSULT NOTE - follow-up  Pharmacy Consult for heparin Indication: chest pain/ACS  Allergies  Allergen Reactions   Bee Venom Anaphylaxis   Hydrocodone Bit-Homatrop Mbr Other (See Comments)    Hallucinations, confusion, delirium Depressed feeling   Toradol [Ketorolac Tromethamine] Other (See Comments)    Hallucinations, confusion, delirium   Sulfadiazine     NDC DVVO:[id number] NDC RSWN:[id number] NDC GHWE:[id number]   Morphine And Related Other (See Comments)    Hallucinations, back in the 80s. States has taken vicodin before w/o problems    Sulfa Drugs Cross Reactors Rash    Patient Measurements: Height: 5' 11"  (180.3 cm) Weight: 105.7 kg (233 lb 1.6 oz) IBW/kg (Calculated) : 75.3 Heparin Dosing Weight: 97.2kg  Vital Signs: Temp: 97.9 F (36.6 C) (02/18 0755) Temp Source: Oral (02/18 0755) BP: 115/79 (02/18 0755) Pulse Rate: 65 (02/18 0755)  Labs: Recent Labs    08/15/21 1141 08/15/21 1359 08/15/21 1822 08/15/21 2028 08/16/21 0401  HGB 13.0  --   --   --  12.0*  HCT 38.1*  --   --   --  35.9*  PLT 128*  --   --   --  116*  HEPARINUNFRC  --   --  0.20*  --  0.38  CREATININE 1.42*  --   --   --  1.37*  TROPONINIHS 5 5 6 4   --      Estimated Creatinine Clearance: 68.3 mL/min (A) (by C-G formula based on SCr of 1.37 mg/dL (H)).   Medical History: Past Medical History:  Diagnosis Date   Abnormal cardiac CT angiography    Acid reflux    Annual physical exam 04/08/2015   Arthritis    Atypical chest pain 06/13/2020   Blood transfusion without reported diagnosis    Body mass index (BMI) 35.0-35.9, adult 04/05/2019   Chronic fatigue 01/28/2015   Chronic headaches    on cymbalta   Chronic migraine w/o aura, not intractable, w/o stat migr 10/24/2018   Cirrhosis ([place])    Colon polyps    Complication of anesthesia    problems waking up from anesthesia   Coronary artery disease 01/23/2019   Depression    on cymbalta   Diabetes mellitus with  neuropathy ([place])    Diabetes with neuropathy 04/25/2013   Diverticulitis 03/2013   Dyslipidemia 05/29/2019   Eczema    Elevated LFTs    Epidural lipomatosis 10/05/2018   Essential (primary) hypertension 04/05/2019   Essential hypertension 10/10/2019   Fatty liver    GERD (gastroesophageal reflux disease) 04/28/2011   H/O craniotomy 05/07/2015   Headache 04/28/2011   Hepatitis 10/2017   NASH cirrhosis   History of kidney stones    Hyperlipidemia    Hypersomnia with sleep apnea 01/28/2015   Hypertension    IDA (iron deficiency anemia) 01/24/2019   Idiopathic intracranial hypertension 01/14/2017   Insomnia 04/26/2013   Kidney stone    Liver cirrhosis secondary to NASH (nonalcoholic steatohepatitis) ([place]) 01/02/2016   Low back pain 04/05/2019   Lower back injury 08/14/2019   Morbid obesity ([place])    Neuromuscular disorder ([place])    neuropathy   Neuropathy    Nonalcoholic steatohepatitis 09/04/1015   Obstructive hydrocephalus ([place]) 01/28/2015   OSA -- dx ~ 2012, cpap intolerant 09/04/2014    dx ~ 2012, cpap intolerant    PCP NOTES >>> 04/08/2015   Post-op pain 03/19/2019   Post-traumatic hydrocephalus (HCC)    s/p shunts x 2 (first got infected )   Presence of cerebrospinal  fluid drainage device 07/28/2011   Psoriasis    sees Dr [name]   Psoriatic arthritis ([place])    REM behavioral disorder 01/14/2017   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   see's Dr.Ortmann   Severe obesity (BMI >= 40) ([place]) 01/28/2015   SI (sacroiliac) joint dysfunction 08/14/2019   Sigmoid diverticulitis 04/25/2013   Sleep apnea    no CPAP      Spondylolisthesis, lumbar region 03/16/2019   Stomach ulcer    Testosterone deficiency 04/28/2011   VP (ventriculoperitoneal) shunt status 07/31/2020    Medications:  Infusions:   heparin 1,400 Units/hr (08/16/21 0408)    Assessment: 48 yom presented to the ED with CP. Known history of CAD (LHC/PCI 09/2020 with DES to LAD). Pharmacy consulted to dose heparin. No  PTA anticoagulation.   LHC scheduled for 2/20.  Heparin level 0.38 therapeutic after 1450 unit bolus and rate increase to 1400 units/hr. Hgb 12, plt 116. No infusion problems or bleeding noted.   Goal of Therapy:  Heparin level 0.3-0.7 units/ml Monitor platelets by anticoagulation protocol: Yes   Plan:  Continue heparin infusion @1400  units/hr Confirmatory 6h heparin level Daily heparin level and CBC  Laurey Arrow, PharmD PGY1 Pharmacy Resident 08/16/2021  10:06 AM  Please check [email or web address] for unit-specific pharmacy phone numbers.

## 2021-08-16 NOTE — Progress Notes (Signed)
°   08/16/21 1843  Vitals  Temp 98.7 F (37.1 C)  Temp Source Oral  BP 113/77  MAP (mmHg) 89  BP Location Left Arm  BP Method Automatic  Patient Position (if appropriate) Lying  Pulse Rate 75  Pulse Rate Source Monitor  Resp 20  Level of Consciousness  Level of Consciousness Alert  MEWS COLOR  MEWS Score Color Green  Oxygen Therapy  SpO2 95 %  O2 Device Nasal Cannula  O2 Flow Rate (L/min) 3 L/min  Patient Activity (if Appropriate) In bed  Pain Assessment  Pain Scale 0-10  Pain Score 1  Pain Type Acute pain  Pain Location Chest  Pain Descriptors / Indicators Pressure  Pain Onset Sudden  Pain Intervention(s) Medication (See eMAR)  MEWS Score  MEWS Temp 0  MEWS Systolic 0  MEWS Pulse 0  MEWS RR 0  MEWS LOC 0  MEWS Score 0   Patient states chest pain has eased some after third nitro given. Spoke to MD. No new orders at this time due to patient's morphine allergy.

## 2021-08-16 NOTE — Progress Notes (Signed)
°   08/16/21 1813  Vitals  Temp 98.7 F (37.1 C)  Temp Source Oral  BP 107/74  MAP (mmHg) 84  BP Location Left Arm  BP Method Automatic  Patient Position (if appropriate) Lying  Pulse Rate 71  Pulse Rate Source Monitor  Resp 18  Level of Consciousness  Level of Consciousness Alert  MEWS COLOR  MEWS Score Color Green  Oxygen Therapy  SpO2 95 %  O2 Device Nasal Cannula  O2 Flow Rate (L/min) 3 L/min  Patient Activity (if Appropriate) In bed  Pain Assessment  Pain Scale 0-10  Pain Score 2  Pain Type Acute pain  Pain Location Chest  Pain Orientation Mid  Pain Descriptors / Indicators Burning;Pressure  Pain Onset Sudden  Pain Intervention(s) Medication (See eMAR)  MEWS Score  MEWS Temp 0  MEWS Systolic 0  MEWS Pulse 0  MEWS RR 0  MEWS LOC 0  MEWS Score 0   Patient called RN stating he was having 2/10 chest pain. Nitrostat given. EKG completed and in chart. Vital signs as noted.

## 2021-08-16 NOTE — Progress Notes (Signed)
°   08/16/21 0705  PACU Vital Signs  ECG Heart Rate 64  Resp 20  BP 120/80  MAP (mmHg) 91  BP Location Left Arm  BP Method Automatic  Patient Position (if appropriate) Lying  Oxygen Therapy  SpO2 100 %  O2 Device Nasal Cannula  O2 Flow Rate (L/min) 3 L/min   Patient report 0/10 chest pain after nitro x1. EKG shows NSR. Will continue to monitor.

## 2021-08-16 NOTE — Plan of Care (Signed)

## 2021-08-16 NOTE — Progress Notes (Signed)
°   08/16/21 1830  Vitals  Temp 98.7 F (37.1 C)  Temp Source Oral  BP 115/68  MAP (mmHg) 81  BP Location Left Arm  BP Method Automatic  Patient Position (if appropriate) Lying  Pulse Rate 70  Pulse Rate Source Monitor  Resp 20  Level of Consciousness  Level of Consciousness Alert  Oxygen Therapy  SpO2 97 %  O2 Device Nasal Cannula  O2 Flow Rate (L/min) 3 L/min  Patient Activity (if Appropriate) In bed  Pain Assessment  Pain Scale 0-10  Pain Score 2  Pain Type Acute pain  Pain Location Chest  Pain Descriptors / Indicators Pressure;Burning  Pain Onset Sudden  Pain Intervention(s) Medication (See eMAR)   Pt continues to complain of chest pressure. Second nitro given. MD paged

## 2021-08-16 NOTE — Progress Notes (Signed)
Progress Note  Patient Name: James [name] Date of Encounter: 08/16/2021  Primary Cardiologist: Jenne Campus, MD   Subjective   C/o intermittent chest pain, "Just like I have last year when I had my stent."  Inpatient Medications    Scheduled Meds:  amLODipine  5 mg Oral Daily   atorvastatin  80 mg Oral Daily   carvedilol  12.5 mg Oral BID WC   clonazePAM  0.5 mg Oral QHS   clopidogrel  75 mg Oral Daily   DULoxetine  60 mg Oral Daily   fenofibrate  160 mg Oral Daily   gabapentin  600 mg Oral BID   glimepiride  4 mg Oral Q breakfast   icosapent Ethyl  2 g Oral BID   insulin aspart  0-15 Units Subcutaneous TID WC   isosorbide mononitrate  60 mg Oral Daily   metFORMIN  1,000 mg Oral BID WC   pantoprazole  40 mg Oral Daily   sodium chloride flush  3 mL Intravenous Q12H   Continuous Infusions:  heparin 1,400 Units/hr (08/16/21 0408)   PRN Meds: acetaminophen, nitroGLYCERIN, ondansetron (ZOFRAN) IV   Vital Signs    Vitals:   08/16/21 0436 08/16/21 0652 08/16/21 0705 08/16/21 0755  BP: 111/77 123/89 120/80 115/79  Pulse: 65   65  Resp: 20 20 20 20   Temp: 97.8 F (36.6 C)   97.9 F (36.6 C)  TempSrc: Oral   Oral  SpO2: 94% 100% 100% 98%  Weight: 105.7 kg     Height:        Intake/Output Summary (Last 24 hours) at 08/16/2021 0937 Last data filed at 08/16/2021 0442 Gross per 24 hour  Intake 539.45 ml  Output 1550 ml  Net -1010.55 ml   Filed Weights   08/15/21 1109 08/15/21 1701 08/16/21 0436  Weight: 104.3 kg 105.9 kg 105.7 kg    Telemetry    nsr - Personally Reviewed  ECG    NSR with NSTTChanges - Personally Reviewed  Physical Exam   GEN: No acute distress.   Neck: No JVD Cardiac: RRR, no murmurs, rubs, or gallops.  Respiratory: Clear to auscultation bilaterally. GI: Soft, nontender, non-distended  MS: No edema; No deformity. Neuro:  Nonfocal  Psych: Normal affect   Labs    Chemistry Recent Labs  Lab 08/15/21 1141 08/16/21 0401   NA 136 138  K 4.4 3.9  CL 104 103  CO2 24 24  GLUCOSE 144* 146*  BUN 21 19  CREATININE 1.42* 1.37*  CALCIUM 9.2 9.0  PROT 7.3  --   ALBUMIN 3.8  --   AST 24  --   ALT 28  --   ALKPHOS 65  --   BILITOT 1.1  --   GFRNONAA 56* 58*  ANIONGAP 8 11     Hematology Recent Labs  Lab 08/15/21 1141 08/16/21 0401  WBC 5.1 4.8  RBC 4.21* 3.97*  HGB 13.0 12.0*  HCT 38.1* 35.9*  MCV 90.5 90.4  MCH 30.9 30.2  MCHC 34.1 33.4  RDW 15.4 15.4  PLT 128* 116*    Cardiac EnzymesNo results for input(s): TROPONINI in the last 168 hours. No results for input(s): TROPIPOC in the last 168 hours.   BNPNo results for input(s): BNP, PROBNP in the last 168 hours.   DDimer No results for input(s): DDIMER in the last 168 hours.   Radiology    DG Chest Portable 1 View  Result Date: 08/15/2021 CLINICAL DATA:  Chest pain. EXAM: PORTABLE CHEST  1 VIEW COMPARISON:  September 17, 2020. FINDINGS: The heart size and mediastinal contours are within normal limits. Both lungs are clear. Harrington rods are noted in the upper thoracic spine. Left-sided ventriculoperitoneal shunt is noted. IMPRESSION: No active disease. Electronically Signed   By: Marijo Conception M.D.   On: 08/15/2021 11:29    Cardiac Studies   Troponin flat  Patient Profile     66 y.o. male admitted with [place]. No objective findings but symptoms identical to what he experienced with admit a year ago which resulted in a stent to LAD.  Assessment & Plan    [place] - continue current meds including IV heparin. Left heart cath on Monday Obesity - he will need to lose weight. HTN - his bp is well controlled. We will follow.   For questions or updates, please contact [place] Please consult [email or web address] for contact info under Cardiology/STEMI.      Signed, Cristopher Peru, MD  08/16/2021, 9:37 AM

## 2021-08-17 LAB — GLUCOSE, CAPILLARY
Glucose-Capillary: 139 mg/dL — ABNORMAL HIGH (ref 70–99)
Glucose-Capillary: 120 mg/dL — ABNORMAL HIGH (ref 70–99)
Glucose-Capillary: 156 mg/dL — ABNORMAL HIGH (ref 70–99)
Glucose-Capillary: 128 mg/dL — ABNORMAL HIGH (ref 70–99)

## 2021-08-17 LAB — CBC
HCT: 37.9 % — ABNORMAL LOW (ref 39.0–52.0)
Hemoglobin: 12.9 g/dL — ABNORMAL LOW (ref 13.0–17.0)
MCH: 31.2 pg (ref 26.0–34.0)
MCHC: 34 g/dL (ref 30.0–36.0)
MCV: 91.5 fL (ref 80.0–100.0)
Platelets: 115 10*3/uL — ABNORMAL LOW (ref 150–400)
RBC: 4.14 MIL/uL — ABNORMAL LOW (ref 4.22–5.81)
RDW: 15.4 % (ref 11.5–15.5)
WBC: 4.5 10*3/uL (ref 4.0–10.5)
nRBC: 0 % (ref 0.0–0.2)

## 2021-08-17 LAB — HEPARIN LEVEL (UNFRACTIONATED): Heparin Unfractionated: 0.51 IU/mL (ref 0.30–0.70)

## 2021-08-17 MED ORDER — SODIUM CHLORIDE 0.9 % WEIGHT BASED INFUSION: 3.0000 mL/kg/h | INTRAVENOUS | Status: DC

## 2021-08-17 MED ORDER — ASPIRIN 81 MG PO CHEW: 81.0000 mg | CHEWABLE_TABLET | ORAL | Status: DC

## 2021-08-17 MED ORDER — SODIUM CHLORIDE 0.9% FLUSH
3.0000 mL | Freq: Two times a day (BID) | INTRAVENOUS | Status: DC
  Administered 2021-08-17 (×2): 3 mL via INTRAVENOUS

## 2021-08-17 MED ORDER — SODIUM CHLORIDE 0.9 % IV SOLN: 250.0000 mL | INTRAVENOUS | Status: DC | PRN

## 2021-08-17 MED ORDER — ADULT MULTIVITAMIN W/MINERALS CH
1.0000 | ORAL_TABLET | Freq: Every day | ORAL | Status: DC
  Administered 2021-08-17 – 2021-08-18 (×2): 1 via ORAL
  Filled 2021-08-17 (×2): qty 1

## 2021-08-17 MED ORDER — SODIUM CHLORIDE 0.9% FLUSH: 3.0000 mL | INTRAVENOUS | Status: DC | PRN

## 2021-08-17 MED ORDER — SODIUM CHLORIDE 0.9 % WEIGHT BASED INFUSION: 1.0000 mL/kg/h | INTRAVENOUS | Status: DC

## 2021-08-17 NOTE — Progress Notes (Signed)
Initial Nutrition Assessment  DOCUMENTATION CODES:   Not applicable  INTERVENTION:   Multivitamin w/ minerals daily Encourage good PO intake  NUTRITION DIAGNOSIS:   Increased nutrient needs related to chronic illness (cirrhosis) as evidenced by estimated needs.  GOAL:   Patient will meet greater than or equal to 90% of their needs  MONITOR:   PO intake, Weight trends, Labs  REASON FOR ASSESSMENT:   Malnutrition Screening Tool    ASSESSMENT:   66 y.o. male presented [facility] ED with chest pain. PMH includes GERD, cirrhosis, T2DM, HTN, and diverticulitis. Pt admitted with unstable angina with plan for L cardiac catheterization.   Pt reports that his appetite has been good at home and while here.  Per EMR, pt intake includes: 2/18: Breakfast 100%, Lunch 100%, Dinner 50% 2/19: Breakfast 100%  Pt reports that he has been losing weight intentionally, but was also unsure how much. Per EMR, pt has had 4% weight loss within 4 months.   Pt denies any ONS use at home, reports that he does not like them. Discussed that we will add double proteins onto his meal trays.   Medications reviewed and include: SSI 0-15 units TID, Protonix Labs reviewed: Hgb A1c 6.5%, 24 hr CBG 101-139  NUTRITION - FOCUSED PHYSICAL EXAM:  Deferred to follow-up due to RD working remotely.   Diet Order:   Diet Order             Diet NPO time specified Except for: Sips with Meds  Diet effective midnight           Diet heart healthy/carb modified Room service appropriate? Yes; Fluid consistency: Thin  Diet effective now                   EDUCATION NEEDS:   No education needs have been identified at this time  Skin:  Skin Assessment: Reviewed RN Assessment  Last BM:  No Documentation  Height:   Ht Readings from Last 1 Encounters:  08/15/21 5' 11"  (1.803 m)    Weight:   Wt Readings from Last 1 Encounters:  08/17/21 107.2 kg    Ideal Body Weight:  78.2 kg  BMI:  Body mass  index is 32.96 kg/m.  Estimated Nutritional Needs:   Kcal:  1657-9038  Protein:  115-130 grams  Fluid:  >/= 2.3 L    Adron Geisel Louie Casa, RD, LDN Clinical Dietitian See [facility] for contact information.

## 2021-08-17 NOTE — Progress Notes (Signed)
Progress Note  Patient Name: James [name] Date of Encounter: 08/17/2021  Primary Cardiologist: Jenne Campus, MD   Subjective   Episode chest pain "Just like my prior heart problems."  Inpatient Medications    Scheduled Meds:  amLODipine  5 mg Oral Daily   [START ON 08/18/2021] aspirin  81 mg Oral Pre-Cath   atorvastatin  80 mg Oral Daily   carvedilol  12.5 mg Oral BID WC   clonazePAM  0.5 mg Oral QHS   clopidogrel  75 mg Oral Daily   DULoxetine  60 mg Oral Daily   fenofibrate  160 mg Oral Daily   gabapentin  600 mg Oral BID   glimepiride  4 mg Oral Q breakfast   icosapent Ethyl  2 g Oral BID   insulin aspart  0-15 Units Subcutaneous TID WC   isosorbide mononitrate  60 mg Oral Daily   multivitamin with minerals  1 tablet Oral Daily   pantoprazole  40 mg Oral Daily   sodium chloride flush  3 mL Intravenous Q12H   Continuous Infusions:  sodium chloride     [START ON 08/18/2021] sodium chloride     Followed by   Derrill Memo ON 08/18/2021] sodium chloride     heparin 1,400 Units/hr (08/17/21 0400)   PRN Meds: sodium chloride, acetaminophen, nitroGLYCERIN, ondansetron (ZOFRAN) IV, sodium chloride flush   Vital Signs    Vitals:   08/17/21 0400 08/17/21 0437 08/17/21 0713 08/17/21 1013  BP:  (!) 136/94 122/87   Pulse:  68 72   Resp:   18   Temp:  98.6 F (37 C) 98 F (36.7 C)   TempSrc:  Oral Oral   SpO2:  97% 97%   Weight: 107.2 kg   107.2 kg  Height:        Intake/Output Summary (Last 24 hours) at 08/17/2021 1053 Last data filed at 08/17/2021 0800 Gross per 24 hour  Intake 694.26 ml  Output 1600 ml  Net -905.74 ml   Filed Weights   08/16/21 0436 08/17/21 0400 08/17/21 1013  Weight: 105.7 kg 107.2 kg 107.2 kg    Telemetry    nsr - Personally Reviewed  ECG    none - Personally Reviewed  Physical Exam   GEN: No acute distress.   Neck: No JVD Cardiac: RRR, no murmurs, rubs, or gallops.  Respiratory: Clear to auscultation bilaterally. GI: Soft,  nontender, non-distended  MS: No edema; No deformity. Neuro:  Nonfocal  Psych: Normal affect   Labs    Chemistry Recent Labs  Lab 08/15/21 1141 08/16/21 0401  NA 136 138  K 4.4 3.9  CL 104 103  CO2 24 24  GLUCOSE 144* 146*  BUN 21 19  CREATININE 1.42* 1.37*  CALCIUM 9.2 9.0  PROT 7.3  --   ALBUMIN 3.8  --   AST 24  --   ALT 28  --   ALKPHOS 65  --   BILITOT 1.1  --   GFRNONAA 56* 58*  ANIONGAP 8 11     Hematology Recent Labs  Lab 08/15/21 1141 08/16/21 0401 08/17/21 0505  WBC 5.1 4.8 4.5  RBC 4.21* 3.97* 4.14*  HGB 13.0 12.0* 12.9*  HCT 38.1* 35.9* 37.9*  MCV 90.5 90.4 91.5  MCH 30.9 30.2 31.2  MCHC 34.1 33.4 34.0  RDW 15.4 15.4 15.4  PLT 128* 116* 115*    Cardiac EnzymesNo results for input(s): TROPONINI in the last 168 hours. No results for input(s): TROPIPOC in the last 168  hours.   BNPNo results for input(s): BNP, PROBNP in the last 168 hours.   DDimer No results for input(s): DDIMER in the last 168 hours.   Radiology    DG Chest Portable 1 View  Result Date: 08/15/2021 CLINICAL DATA:  Chest pain. EXAM: PORTABLE CHEST 1 VIEW COMPARISON:  September 17, 2020. FINDINGS: The heart size and mediastinal contours are within normal limits. Both lungs are clear. Harrington rods are noted in the upper thoracic spine. Left-sided ventriculoperitoneal shunt is noted. IMPRESSION: No active disease. Electronically Signed   By: Marijo Conception M.D.   On: 08/15/2021 11:29    Cardiac Studies   none  Patient Profile     66 y.o. male admitted for evaluation of [place] and known CAD  Assessment & Plan    [place] - he is on schedule for left heart cath tomorrow. CAD - he is s/p prior LAD stent. Continue IV heparin.  For questions or updates, please contact [place] Please consult [email or web address] for contact info under Cardiology/STEMI.      Signed, Cristopher Peru, MD  08/17/2021, 10:53 AM

## 2021-08-17 NOTE — Progress Notes (Addendum)
Progress Note  Patient Name: James [name] Date of Encounter: 08/17/2021  [facility] Cardiologist: Jenne Campus, MD   Subjective   Had recurrent chest pain yesterday evening which resolved with SL nitro x 3. Slept well. Chest pain free this morning. No dyspnea.   Inpatient Medications    Scheduled Meds:  amLODipine  5 mg Oral Daily   atorvastatin  80 mg Oral Daily   carvedilol  12.5 mg Oral BID WC   clonazePAM  0.5 mg Oral QHS   clopidogrel  75 mg Oral Daily   DULoxetine  60 mg Oral Daily   fenofibrate  160 mg Oral Daily   gabapentin  600 mg Oral BID   glimepiride  4 mg Oral Q breakfast   icosapent Ethyl  2 g Oral BID   insulin aspart  0-15 Units Subcutaneous TID WC   isosorbide mononitrate  60 mg Oral Daily   pantoprazole  40 mg Oral Daily   sodium chloride flush  3 mL Intravenous Q12H   Continuous Infusions:  heparin 1,400 Units/hr (08/17/21 0400)   PRN Meds: acetaminophen, nitroGLYCERIN, ondansetron (ZOFRAN) IV   Vital Signs    Vitals:   08/16/21 2325 08/17/21 0400 08/17/21 0437 08/17/21 0713  BP: 114/77  (!) 136/94 122/87  Pulse: 72  68 72  Resp:    18  Temp: 98.3 F (36.8 C)  98.6 F (37 C) 98 F (36.7 C)  TempSrc: Oral  Oral Oral  SpO2: 92%  97% 97%  Weight:  107.2 kg    Height:        Intake/Output Summary (Last 24 hours) at 08/17/2021 0910 Last data filed at 08/17/2021 0800 Gross per 24 hour  Intake 697.26 ml  Output 1600 ml  Net -902.74 ml   Last 3 Weights 08/17/2021 08/16/2021 08/15/2021  Weight (lbs) 236 lb 5.3 oz 233 lb 1.6 oz 233 lb 7.5 oz  Weight (kg) 107.2 kg 105.733 kg 105.9 kg      Telemetry    NSR - Personally Reviewed  ECG    NSR - Personally Reviewed  Physical Exam   GEN: No acute distress.   Neck: No JVD Cardiac: RRR, no murmurs, rubs, or gallops.  Respiratory: Clear to auscultation bilaterally. GI: Soft, nontender, non-distended  MS: No edema; No deformity. Neuro:  Nonfocal  Psych: Normal affect   Labs     High Sensitivity Troponin:   Recent Labs  Lab 08/15/21 1141 08/15/21 1359 08/15/21 1822 08/15/21 2028  TROPONINIHS 5 5 6 4      Chemistry Recent Labs  Lab 08/15/21 1141 08/16/21 0401  NA 136 138  K 4.4 3.9  CL 104 103  CO2 24 24  GLUCOSE 144* 146*  BUN 21 19  CREATININE 1.42* 1.37*  CALCIUM 9.2 9.0  PROT 7.3  --   ALBUMIN 3.8  --   AST 24  --   ALT 28  --   ALKPHOS 65  --   BILITOT 1.1  --   GFRNONAA 56* 58*  ANIONGAP 8 11    Lipids  Recent Labs  Lab 08/16/21 0401  CHOL 118  TRIG 101  HDL 43  LDLCALC 55  CHOLHDL 2.7    Hematology Recent Labs  Lab 08/15/21 1141 08/16/21 0401 08/17/21 0505  WBC 5.1 4.8 4.5  RBC 4.21* 3.97* 4.14*  HGB 13.0 12.0* 12.9*  HCT 38.1* 35.9* 37.9*  MCV 90.5 90.4 91.5  MCH 30.9 30.2 31.2  MCHC 34.1 33.4 34.0  RDW 15.4 15.4  15.4  PLT 128* 116* 115*     Radiology    DG Chest Portable 1 View  Result Date: 08/15/2021 CLINICAL DATA:  Chest pain. EXAM: PORTABLE CHEST 1 VIEW COMPARISON:  September 17, 2020. FINDINGS: The heart size and mediastinal contours are within normal limits. Both lungs are clear. Harrington rods are noted in the upper thoracic spine. Left-sided ventriculoperitoneal shunt is noted. IMPRESSION: No active disease. Electronically Signed   By: Marijo Conception M.D.   On: 08/15/2021 11:29    Cardiac Studies   Pending cath tomorrow   Patient Profile     66 y.o. male  with coronary artery disease status post PCI to the LAD 09/2020, diabetes mellitus, hypertension, dyslipidemia, nonalcoholic liver cirrhosis admitted for [place].   Assessment & Plan    Unstable angina - Hx of CAD s/p DES to LAD 09/2020 -Presented with chest pain concerning for unstable angina.  Troponin negative.  EKG without acute ischemic changes.  He had a recurrent chest pain yesterday requiring sublingual nitroglycerin.  Currently chest pain-free. -Today, patient reported he has stopped taking his aspirin by himself for past few months for unknown  reason.  He thought it was not needed.  Reported taking other medication.  Questionable in-stent restenosis.  Plan for cardiac catheterization tomorrow. -Consider nitroglycerin drip if he has recurrent chest pain today -Patient will need detailed education about medication based on cath report -Continue Plavix 75 mg daily, carvedilol 12.5 mg twice daily, amlodipine 5 mg daily -Imdur increased to 60 mg daily this admission -Continue statin -Add aspirin 81 mg daily  2.  Hyperlipidemia -08/16/2021: Cholesterol 118; HDL 43; LDL Cholesterol 55; Triglycerides 101; VLDL 20  -Continue Lipitor 80 mg daily, fenofibrate 160 mg daily and Vascepa  3. HTN -Blood pressure stable on current medications  For questions or updates, please contact [place] Please consult [email or web address] for contact info under     Jarrett Soho, PA  08/17/2021, 9:10 AM     Cardiology Attending  Patient seen and examined. Agree with above. He will undergo left heart cath. Continue IV heparin.   Carleene Overlie Merilynn Haydu,MD

## 2021-08-17 NOTE — Progress Notes (Signed)
ANTICOAGULATION CONSULT NOTE - follow-up  Pharmacy Consult for heparin Indication: chest pain/ACS  Allergies  Allergen Reactions   Bee Venom Anaphylaxis   Hydrocodone Bit-Homatrop Mbr Other (See Comments)    Hallucinations, confusion, delirium Depressed feeling   Toradol [Ketorolac Tromethamine] Other (See Comments)    Hallucinations, confusion, delirium   Sulfadiazine     NDC ZHYQ:[id number] NDC MWUX:[id number] NDC DGUY:[id number]   Morphine And Related Other (See Comments)    Hallucinations, back in the 80s. States has taken vicodin before w/o problems    Sulfa Drugs Cross Reactors Rash    Patient Measurements: Height: 5' 11"  (180.3 cm) Weight: 107.2 kg (236 lb 5.3 oz) IBW/kg (Calculated) : 75.3 Heparin Dosing Weight: 97.2kg  Vital Signs: Temp: 98 F (36.7 C) (02/19 0713) Temp Source: Oral (02/19 0713) BP: 122/87 (02/19 0713) Pulse Rate: 72 (02/19 0713)  Labs: Recent Labs    08/15/21 1141 08/15/21 1359 08/15/21 1822 08/15/21 1822 08/15/21 2028 08/16/21 0401 08/16/21 1041 08/17/21 0505  HGB 13.0  --   --   --   --  12.0*  --  12.9*  HCT 38.1*  --   --   --   --  35.9*  --  37.9*  PLT 128*  --   --   --   --  116*  --  115*  HEPARINUNFRC  --   --  0.20*   < >  --  0.38 0.49 0.51  CREATININE 1.42*  --   --   --   --  1.37*  --   --   TROPONINIHS 5 5 6   --  4  --   --   --    < > = values in this interval not displayed.     Estimated Creatinine Clearance: 68.8 mL/min (A) (by C-G formula based on SCr of 1.37 mg/dL (H)).   Medical History: Past Medical History:  Diagnosis Date   Abnormal cardiac CT angiography    Acid reflux    Annual physical exam 04/08/2015   Arthritis    Atypical chest pain 06/13/2020   Blood transfusion without reported diagnosis    Body mass index (BMI) 35.0-35.9, adult 04/05/2019   Chronic fatigue 01/28/2015   Chronic headaches    on cymbalta   Chronic migraine w/o aura, not intractable, w/o stat migr 10/24/2018   Cirrhosis  ([place])    Colon polyps    Complication of anesthesia    problems waking up from anesthesia   Coronary artery disease 01/23/2019   Depression    on cymbalta   Diabetes mellitus with neuropathy ([place])    Diabetes with neuropathy 04/25/2013   Diverticulitis 03/2013   Dyslipidemia 05/29/2019   Eczema    Elevated LFTs    Epidural lipomatosis 10/05/2018   Essential (primary) hypertension 04/05/2019   Essential hypertension 10/10/2019   Fatty liver    GERD (gastroesophageal reflux disease) 04/28/2011   H/O craniotomy 05/07/2015   Headache 04/28/2011   Hepatitis 10/2017   NASH cirrhosis   History of kidney stones    Hyperlipidemia    Hypersomnia with sleep apnea 01/28/2015   Hypertension    IDA (iron deficiency anemia) 01/24/2019   Idiopathic intracranial hypertension 01/14/2017   Insomnia 04/26/2013   Kidney stone    Liver cirrhosis secondary to NASH (nonalcoholic steatohepatitis) ([place]) 01/02/2016   Low back pain 04/05/2019   Lower back injury 08/14/2019   Morbid obesity ([place])    Neuromuscular disorder (HCC)    neuropathy   Neuropathy  Nonalcoholic steatohepatitis 09/05/7671   Obstructive hydrocephalus ([place]) 01/28/2015   OSA -- dx ~ 2012, cpap intolerant 09/04/2014    dx ~ 2012, cpap intolerant    PCP NOTES >>> 04/08/2015   Post-op pain 03/19/2019   Post-traumatic hydrocephalus ([place])    s/p shunts x 2 (first got infected )   Presence of cerebrospinal fluid drainage device 07/28/2011   Psoriasis    sees Dr [name]   Psoriatic arthritis ([place])    REM behavioral disorder 01/14/2017   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   see's Dr.Ortmann   Severe obesity (BMI >= 40) ([place]) 01/28/2015   SI (sacroiliac) joint dysfunction 08/14/2019   Sigmoid diverticulitis 04/25/2013   Sleep apnea    no CPAP      Spondylolisthesis, lumbar region 03/16/2019   Stomach ulcer    Testosterone deficiency 04/28/2011   VP (ventriculoperitoneal) shunt status 07/31/2020    Medications:  Infusions:    heparin 1,400 Units/hr (08/17/21 0400)    Assessment: 36 yom presented to the ED with CP. Known history of CAD (LHC/PCI 09/2020 with DES to LAD). Pharmacy consulted to dose heparin. No PTA anticoagulation.   LHC scheduled for 2/20.  Heparin level 0.51 therapeutic on 1400 units/hr. Hgb 12.9, plt 115 stable. No infusion problems or bleeding noted.  Goal of Therapy:  Heparin level 0.3-0.7 units/ml Monitor platelets by anticoagulation protocol: Yes   Plan:  Continue heparin infusion @1400  units/hr Daily heparin level and CBC  Laurey Arrow, PharmD PGY1 Pharmacy Resident 08/17/2021  8:43 AM  Please check [email or web address] for unit-specific pharmacy phone numbers.

## 2021-08-17 NOTE — Plan of Care (Signed)
°  Problem: Education: Goal: Knowledge of General Education information will improve Description: Including pain rating scale, medication(s)/side effects and non-pharmacologic comfort measures Outcome: Progressing   Problem: Health Behavior/Discharge Planning: Goal: Ability to manage health-related needs will improve Outcome: Progressing   Problem: Clinical Measurements: Goal: Ability to maintain clinical measurements within normal limits will improve Outcome: Progressing Goal: Will remain free from infection Outcome: Progressing Goal: Diagnostic test results will improve Outcome: Progressing Goal: Respiratory complications will improve Outcome: Progressing Goal: Cardiovascular complication will be avoided Outcome: Progressing   Problem: Activity: Goal: Risk for activity intolerance will decrease Outcome: Progressing   Problem: Nutrition: Goal: Adequate nutrition will be maintained Outcome: Progressing   Problem: Coping: Goal: Level of anxiety will decrease Outcome: Progressing   Problem: Elimination: Goal: Will not experience complications related to bowel motility Outcome: Progressing Goal: Will not experience complications related to urinary retention Outcome: Progressing   Problem: Pain Managment: Goal: General experience of comfort will improve Outcome: Progressing   Problem: Safety: Goal: Ability to remain free from injury will improve Outcome: Progressing   Problem: Skin Integrity: Goal: Risk for impaired skin integrity will decrease Outcome: Progressing   Problem: Education: Goal: Understanding of cardiac disease, CV risk reduction, and recovery process will improve Outcome: Progressing Goal: Individualized Educational Video(s) Outcome: Progressing   Problem: Activity: Goal: Ability to tolerate increased activity will improve Outcome: Progressing   Problem: Cardiac: Goal: Ability to achieve and maintain adequate cardiovascular perfusion will  improve Outcome: Progressing   Problem: Health Behavior/Discharge Planning: Goal: Ability to safely manage health-related needs after discharge will improve Outcome: Progressing

## 2021-08-18 ENCOUNTER — Other Ambulatory Visit (HOSPITAL_COMMUNITY): Payer: Self-pay

## 2021-08-18 ENCOUNTER — Encounter (HOSPITAL_COMMUNITY): Payer: Self-pay | Admitting: Cardiovascular Disease

## 2021-08-18 ENCOUNTER — Other Ambulatory Visit: Payer: Self-pay | Admitting: Cardiology

## 2021-08-18 ENCOUNTER — Other Ambulatory Visit (HOSPITAL_BASED_OUTPATIENT_CLINIC_OR_DEPARTMENT_OTHER): Payer: Self-pay

## 2021-08-18 ENCOUNTER — Inpatient Hospital Stay (HOSPITAL_COMMUNITY)
Admission: EM | Disposition: A | Discharge status: Home / Self Care | Payer: Self-pay | Source: Home / Self Care | Attending: Cardiology

## 2021-08-18 DIAGNOSIS — I200 Unstable angina: Secondary | ICD-10-CM

## 2021-08-18 DIAGNOSIS — I2583 Coronary atherosclerosis due to lipid rich plaque: Secondary | ICD-10-CM

## 2021-08-18 DIAGNOSIS — I25119 Atherosclerotic heart disease of native coronary artery with unspecified angina pectoris: Secondary | ICD-10-CM

## 2021-08-18 DIAGNOSIS — I2510 Atherosclerotic heart disease of native coronary artery without angina pectoris: Secondary | ICD-10-CM

## 2021-08-18 DIAGNOSIS — E7800 Pure hypercholesterolemia, unspecified: Secondary | ICD-10-CM

## 2021-08-18 DIAGNOSIS — N1831 Chronic kidney disease, stage 3a: Secondary | ICD-10-CM

## 2021-08-18 DIAGNOSIS — Z9889 Other specified postprocedural states: Secondary | ICD-10-CM

## 2021-08-18 HISTORY — PX: LEFT HEART CATH AND CORONARY ANGIOGRAPHY: CATH118249

## 2021-08-18 LAB — GLUCOSE, CAPILLARY
Glucose-Capillary: 93 mg/dL (ref 70–99)
Glucose-Capillary: 93 mg/dL (ref 70–99)
Glucose-Capillary: 90 mg/dL (ref 70–99)

## 2021-08-18 LAB — CBC
HCT: 40.8 % (ref 39.0–52.0)
Hemoglobin: 13.8 g/dL (ref 13.0–17.0)
MCH: 30.7 pg (ref 26.0–34.0)
MCHC: 33.8 g/dL (ref 30.0–36.0)
MCV: 90.7 fL (ref 80.0–100.0)
Platelets: 143 10*3/uL — ABNORMAL LOW (ref 150–400)
RBC: 4.5 MIL/uL (ref 4.22–5.81)
RDW: 15.5 % (ref 11.5–15.5)
WBC: 6.6 10*3/uL (ref 4.0–10.5)
nRBC: 0 % (ref 0.0–0.2)

## 2021-08-18 LAB — BASIC METABOLIC PANEL
Anion gap: 10 (ref 5–15)
BUN: 15 mg/dL (ref 8–23)
CO2: 23 mmol/L (ref 22–32)
Calcium: 9.6 mg/dL (ref 8.9–10.3)
Chloride: 106 mmol/L (ref 98–111)
Creatinine, Ser: 1.38 mg/dL — ABNORMAL HIGH (ref 0.61–1.24)
GFR, Estimated: 57 mL/min — ABNORMAL LOW (ref >60)
Glucose, Bld: 83 mg/dL (ref 70–99)
Potassium: 4.3 mmol/L (ref 3.5–5.1)
Sodium: 139 mmol/L (ref 135–145)

## 2021-08-18 LAB — HEPARIN LEVEL (UNFRACTIONATED): Heparin Unfractionated: 0.45 IU/mL (ref 0.30–0.70)

## 2021-08-18 LAB — POCT ACTIVATED CLOTTING TIME: Activated Clotting Time: 143 seconds

## 2021-08-18 SURGERY — LEFT HEART CATH AND CORONARY ANGIOGRAPHY
Anesthesia: LOCAL

## 2021-08-18 MED ORDER — SODIUM CHLORIDE 0.9 % IV SOLN: INTRAVENOUS | Status: DC

## 2021-08-18 MED ORDER — SODIUM CHLORIDE 0.9% FLUSH
3.0000 mL | Freq: Two times a day (BID) | INTRAVENOUS | Status: DC
  Administered 2021-08-18: 3 mL via INTRAVENOUS

## 2021-08-18 MED ORDER — MIDAZOLAM HCL 2 MG/2ML IJ SOLN
INTRAMUSCULAR | Status: DC | PRN
  Administered 2021-08-18: 1 mg via INTRAVENOUS
  Administered 2021-08-18: 2 mg via INTRAVENOUS

## 2021-08-18 MED ORDER — IOHEXOL 350 MG/ML SOLN
INTRAVENOUS | Status: DC | PRN
Start: 2021-08-18 — End: 2021-08-18
  Administered 2021-08-18: 90 mL

## 2021-08-18 MED ORDER — SODIUM CHLORIDE 0.9% FLUSH: 3.0000 mL | INTRAVENOUS | Status: DC | PRN

## 2021-08-18 MED ORDER — ASPIRIN 81 MG PO CHEW: 81.0000 mg | CHEWABLE_TABLET | Freq: Every day | ORAL | Status: AC

## 2021-08-18 MED ORDER — MIDAZOLAM HCL 2 MG/2ML IJ SOLN
INTRAMUSCULAR | Status: AC
  Filled 2021-08-18: qty 2

## 2021-08-18 MED ORDER — ASPIRIN 81 MG PO CHEW
81.0000 mg | CHEWABLE_TABLET | Freq: Every day | ORAL | Status: DC
  Administered 2021-08-18: 81 mg via ORAL
  Filled 2021-08-18: qty 1

## 2021-08-18 MED ORDER — ISOSORBIDE MONONITRATE ER 60 MG PO TB24
60.0000 mg | ORAL_TABLET | Freq: Every day | ORAL | 6 refills | Status: DC
  Filled 2021-08-18: qty 30, 30d supply, fill #0
  Filled 2021-09-18 – 2021-09-30 (×3): qty 30, 30d supply, fill #1
  Filled 2021-10-21: qty 30, 30d supply, fill #2
  Filled 2021-10-22 – 2021-10-24 (×3): qty 30, 30d supply, fill #0
  Filled 2021-11-28: qty 30, 30d supply, fill #1
  Filled 2022-01-07: qty 30, 30d supply, fill #2
  Filled 2022-02-08: qty 30, 30d supply, fill #3
  Filled 2022-03-23: qty 30, 30d supply, fill #4

## 2021-08-18 MED ORDER — HEPARIN (PORCINE) IN NACL 1000-0.9 UT/500ML-% IV SOLN
INTRAVENOUS | Status: DC | PRN
  Administered 2021-08-18 (×2): 500 mL

## 2021-08-18 MED ORDER — HYDRALAZINE HCL 20 MG/ML IJ SOLN: 10.0000 mg | INTRAMUSCULAR | Status: DC | PRN

## 2021-08-18 MED ORDER — ACETAMINOPHEN 325 MG PO TABS: 650.0000 mg | ORAL_TABLET | ORAL | Status: DC | PRN

## 2021-08-18 MED ORDER — SODIUM CHLORIDE 0.9 % WEIGHT BASED INFUSION
3.0000 mL/kg/h | INTRAVENOUS | Status: DC
  Administered 2021-08-18: 3 mL/kg/h via INTRAVENOUS

## 2021-08-18 MED ORDER — LABETALOL HCL 5 MG/ML IV SOLN: 10.0000 mg | INTRAVENOUS | Status: DC | PRN

## 2021-08-18 MED ORDER — ONDANSETRON HCL 4 MG/2ML IJ SOLN: 4.0000 mg | Freq: Four times a day (QID) | INTRAMUSCULAR | Status: DC | PRN

## 2021-08-18 MED ORDER — CLOPIDOGREL BISULFATE 75 MG PO TABS: 75.0000 mg | ORAL_TABLET | Freq: Every day | ORAL | Status: DC

## 2021-08-18 MED ORDER — HEPARIN SODIUM (PORCINE) 1000 UNIT/ML IJ SOLN
INTRAMUSCULAR | Status: AC
  Filled 2021-08-18: qty 10

## 2021-08-18 MED ORDER — SODIUM CHLORIDE 0.9 % WEIGHT BASED INFUSION
1.0000 mL/kg/h | INTRAVENOUS | Status: DC
  Administered 2021-08-18 (×2): 1 mL/kg/h via INTRAVENOUS

## 2021-08-18 MED ORDER — HEPARIN (PORCINE) IN NACL 1000-0.9 UT/500ML-% IV SOLN
INTRAVENOUS | Status: AC
  Filled 2021-08-18: qty 1000

## 2021-08-18 MED ORDER — ASPIRIN 81 MG PO CHEW
81.0000 mg | CHEWABLE_TABLET | ORAL | Status: AC
  Administered 2021-08-18: 81 mg via ORAL
  Filled 2021-08-18: qty 1

## 2021-08-18 MED ORDER — VERAPAMIL HCL 2.5 MG/ML IV SOLN
INTRAVENOUS | Status: AC
  Filled 2021-08-18: qty 2

## 2021-08-18 MED ORDER — METFORMIN HCL 500 MG PO TABS
1000.0000 mg | ORAL_TABLET | Freq: Two times a day (BID) | ORAL | 3 refills | Status: DC
  Filled 2021-08-18 – 2021-09-30 (×3): qty 120, 30d supply, fill #0
  Filled 2021-12-15: qty 120, 30d supply, fill #1
  Filled 2022-02-09: qty 120, 30d supply, fill #2
  Filled 2022-03-23: qty 120, 30d supply, fill #3

## 2021-08-18 MED ORDER — ACETAMINOPHEN 325 MG PO TABS: 650.0000 mg | ORAL_TABLET | ORAL | Status: AC | PRN

## 2021-08-18 MED ORDER — LIDOCAINE HCL (PF) 1 % IJ SOLN
INTRAMUSCULAR | Status: AC
  Filled 2021-08-18: qty 30

## 2021-08-18 MED ORDER — FENTANYL CITRATE (PF) 100 MCG/2ML IJ SOLN
INTRAMUSCULAR | Status: AC
  Filled 2021-08-18: qty 2

## 2021-08-18 MED ORDER — LIDOCAINE HCL (PF) 1 % IJ SOLN
INTRAMUSCULAR | Status: DC | PRN
Start: 2021-08-18 — End: 2021-08-18
  Administered 2021-08-18: 15 mL

## 2021-08-18 MED ORDER — SODIUM CHLORIDE 0.9 % IV SOLN: 250.0000 mL | INTRAVENOUS | Status: DC | PRN

## 2021-08-18 MED ORDER — FENTANYL CITRATE (PF) 100 MCG/2ML IJ SOLN
INTRAMUSCULAR | Status: DC | PRN
  Administered 2021-08-18 (×2): 25 ug via INTRAVENOUS

## 2021-08-18 SURGICAL SUPPLY — 12 items
CATH INFINITI 5FR JL4 (CATHETERS) ×1 IMPLANT
CATH INFINITI JR4 5F (CATHETERS) ×1 IMPLANT
GLIDESHEATH SLEND SS 6F .021 (SHEATH) IMPLANT
GUIDEWIRE INQWIRE 1.5J.035X260 (WIRE) IMPLANT
INQWIRE 1.5J .035X260CM (WIRE)
KIT HEART LEFT (KITS) ×2 IMPLANT
PACK CARDIAC CATHETERIZATION (CUSTOM PROCEDURE TRAY) ×2 IMPLANT
SHEATH PINNACLE 5F 10CM (SHEATH) ×1 IMPLANT
SHEATH PROBE COVER 6X72 (BAG) ×1 IMPLANT
TRANSDUCER W/STOPCOCK (MISCELLANEOUS) ×2 IMPLANT
TUBING CIL FLEX 10 FLL-RA (TUBING) ×2 IMPLANT
WIRE EMERALD 3MM-J .035X150CM (WIRE) ×1 IMPLANT

## 2021-08-18 NOTE — Discharge Instructions (Addendum)
Call [facility] at [phone] if any bleeding, swelling or drainage at cath site.  May shower, no tub baths for 48 hours for groin sticks. No lifting over 5 pounds for 3 days.  No Driving for 3 days   Heart Healthy Diabetic diet  Call the office for questions or concerns  Please note we adjusted your meds, increased the imdur.   Your carvedilol needs to be twice per day,  it only lasts 12 hours so that may be why you have chest pain if taking differently.  You need to be on both asprin and plavix.  This may help prevent angina.    Hold your Metformin until the 23 of Feb to prevent interaction with this medication with cath dye.  After the 22nd no problems taking   Have lab work done at office on Monday- I sent in order  to check your kidney function.

## 2021-08-18 NOTE — Progress Notes (Signed)
Site area: rt groin arterial sheath Site Prior to Removal:  Level 0 Pressure Applied For: 20 minutes Manual:   yes Patient Status During Pull:  stable Post Pull Site:  Level 0 Post Pull Instructions Given:  yes Post Pull Pulses Present: rt dp palpable Dressing Applied:  gauze and tegaderm Bedrest begins @ 0900 Comments:

## 2021-08-18 NOTE — TOC Transition Note (Signed)
Transition of Care [facility]) - CM/SW Discharge Note   Patient Details  Name: ALLISON SILVA MRN: [id number] Date of Birth: July 04, 1955  [facility] of [facility]) CM/SW Contact:  Zenon Mayo, RN Phone Number: 08/18/2021, 2:01 PM   Clinical Narrative:    Patient for dc home , no needs.   Final next level of care: Home/Self Care Barriers to Discharge: No Barriers Identified   Patient Goals and CMS Choice     Choice offered to / list presented to : NA  Discharge Placement                       Discharge Plan and Services                  DME Agency: NA       HH Arranged: NA          Social Determinants of Health (SDOH) Interventions     Readmission Risk Interventions No flowsheet data found.

## 2021-08-18 NOTE — TOC Progression Note (Signed)
Transition of Care [facility]) - Progression Note    Patient Details  Name: James [name] MRN: [id number] Date of Birth: September 19, 1955  [facility]) CM/SW Contact  Zenon Mayo, RN Phone Number: 08/18/2021, 12:42 PM  Clinical Narrative:    rom home with wife, chest pain, s/p cath today, for medical management. TOC will continue to follow for dc needs.        Expected Discharge Plan and Services                                                 Social Determinants of Health (SDOH) Interventions    Readmission Risk Interventions No flowsheet data found.

## 2021-08-18 NOTE — Consult Note (Signed)
° °  [facility] Inpatient Consult   08/18/2021  James [name] 1956/01/22 [id number]   [employer] [ACO] Patient: James [name] Employee plan  Primary Care Provider:  Colon Branch, MD, [place] at [facility] is an Embedded provider that is listed to the [facility] calls and follow up  Patient has been  followed by telephonic RNchronic disease management services.    Met with patient and wife at the bedside and they endorses Dr. [name] and denies any SDOH as addressed.  Gave patient a reminder card and a 24 hour nurse advise line magnet.  He states a nurse from the plan is following every 3 months for his diabetes.    Plan: No additional follow up needs expressed.     For additional questions or referrals please contact:   Natividad Brood, RN BSN [facility] Liaison  [phone] business mobile phone Toll free office [phone]  Fax number: [phone] [email or web address]

## 2021-08-18 NOTE — Progress Notes (Signed)
Progress Note  Patient Name: JERMARION POFFENBERGER Date of Encounter: 08/18/2021  [facility] Cardiologist: Jenne Campus, MD   Subjective   Post cath sleepy!  No pain, resting, his pain was same as cardiac pain in past.    Inpatient Medications    Scheduled Meds:  amLODipine  5 mg Oral Daily   atorvastatin  80 mg Oral Daily   carvedilol  12.5 mg Oral BID WC   clonazePAM  0.5 mg Oral QHS   clopidogrel  75 mg Oral Daily   DULoxetine  60 mg Oral Daily   fenofibrate  160 mg Oral Daily   gabapentin  600 mg Oral BID   glimepiride  4 mg Oral Q breakfast   icosapent Ethyl  2 g Oral BID   insulin aspart  0-15 Units Subcutaneous TID WC   isosorbide mononitrate  60 mg Oral Daily   multivitamin with minerals  1 tablet Oral Daily   pantoprazole  40 mg Oral Daily   sodium chloride flush  3 mL Intravenous Q12H   Continuous Infusions:  sodium chloride 125 mL/hr at 08/18/21 0847   heparin 1,400 Units/hr (08/18/21 0600)   PRN Meds: acetaminophen, nitroGLYCERIN, ondansetron (ZOFRAN) IV   Vital Signs    Vitals:   08/18/21 0850 08/18/21 0855 08/18/21 0900 08/18/21 0905  BP: 132/83 133/88 131/82 139/80  Pulse: 64 64 63 67  Resp: 18 18 (!) 22 (!) 21  Temp:      TempSrc:      SpO2: 93% 95% 95% 93%  Weight:      Height:        Intake/Output Summary (Last 24 hours) at 08/18/2021 0920 Last data filed at 08/18/2021 0600 Gross per 24 hour  Intake 482.52 ml  Output 400 ml  Net 82.52 ml   Last 3 Weights 08/18/2021 08/17/2021 08/17/2021  Weight (lbs) 231 lb 236 lb 5.3 oz 236 lb 5.3 oz  Weight (kg) 104.781 kg 107.2 kg 107.2 kg      Telemetry    SR - Personally Reviewed  ECG    No new - Personally Reviewed  Physical Exam   GEN: No acute distress.   Neck: No JVD Cardiac: RRR, no murmurs, rubs, or gallops.  Respiratory: Clear to auscultation bilaterally. GI: Soft, nontender, non-distended  MS: No edema; No deformity. Neuro:  Nonfocal  Psych: Normal affect   Labs    High  Sensitivity Troponin:   Recent Labs  Lab 08/15/21 1141 08/15/21 1359 08/15/21 1822 08/15/21 2028  TROPONINIHS 5 5 6 4      Chemistry Recent Labs  Lab 08/15/21 1141 08/16/21 0401 08/18/21 0350  NA 136 138 139  K 4.4 3.9 4.3  CL 104 103 106  CO2 24 24 23   GLUCOSE 144* 146* 83  BUN 21 19 15   CREATININE 1.42* 1.37* 1.38*  CALCIUM 9.2 9.0 9.6  PROT 7.3  --   --   ALBUMIN 3.8  --   --   AST 24  --   --   ALT 28  --   --   ALKPHOS 65  --   --   BILITOT 1.1  --   --   GFRNONAA 56* 58* 57*  ANIONGAP 8 11 10     Lipids  Recent Labs  Lab 08/16/21 0401  CHOL 118  TRIG 101  HDL 43  LDLCALC 55  CHOLHDL 2.7    Hematology Recent Labs  Lab 08/16/21 0401 08/17/21 0505 08/18/21 0350  WBC 4.8 4.5 6.6  RBC 3.97* 4.14* 4.50  HGB 12.0* 12.9* 13.8  HCT 35.9* 37.9* 40.8  MCV 90.4 91.5 90.7  MCH 30.2 31.2 30.7  MCHC 33.4 34.0 33.8  RDW 15.4 15.4 15.5  PLT 116* 115* 143*   Thyroid No results for input(s): TSH, FREET4 in the last 168 hours.  BNPNo results for input(s): BNP, PROBNP in the last 168 hours.  DDimer No results for input(s): DDIMER in the last 168 hours.   Radiology    CARDIAC CATHETERIZATION  Result Date: 08/18/2021   Prox Cx to Mid Cx lesion is 75% stenosed.   RPAV lesion is 50% stenosed.   RPDA lesion is 30% stenosed.   Prox LAD lesion is 50% stenosed.   Mid LAD lesion is 50% stenosed.   1st Diag lesion is 50% stenosed.   2nd Diag lesion is 70% stenosed.   Previously placed Mid LAD to Dist LAD stent (unknown type) is  widely patent. Multivessel CAD with 50% smooth ostial narrowing of the LAD followed by 50% proximal stenosis after the proximal septal perforating artery prior to the stented segment.  The LAD stent is widely patent.  The first and second diagonal vessels are small caliber with ostial narrowing of 50 and 70%; no significant change in the previously noted 75% circumflex stenosis on a sharp bend in the vessel; large dominant RCA with mild luminal  irregularity and no significant change in the 50% stenosis beyond the PDA takeoff with mild 20% mid RCA stenosis and 30% smooth ostial PDA narrowing. Low normal LV function with EF estimated approximately 50% without definitive wall motion abnormalities.  LVEDP 8 mmHg. RECOMMENDATION: Medical therapy.  The patient has been on long-term clopidogrel without aspirin.  With recent increasing chest pain consider reinstitution of aspirin if no contraindication and increased anti-ischemic regimen. Aggressive lipid-lowering therapy with target LDL less than 70%.  Optimal blood pressure control.    Cardiac Studies   Cardiac cath 2.20.23    Prox Cx to Mid Cx lesion is 75% stenosed.   RPAV lesion is 50% stenosed.   RPDA lesion is 30% stenosed.   Prox LAD lesion is 50% stenosed.   Mid LAD lesion is 50% stenosed.   1st Diag lesion is 50% stenosed.   2nd Diag lesion is 70% stenosed.   Previously placed Mid LAD to Dist LAD stent (unknown type) is  widely patent.   Multivessel CAD with 50% smooth ostial narrowing of the LAD followed by 50% proximal stenosis after the proximal septal perforating artery prior to the stented segment.  The LAD stent is widely patent.  The first and second diagonal vessels are small caliber with ostial narrowing of 50 and 70%; no significant change in the previously noted 75% circumflex stenosis on a sharp bend in the vessel; large dominant RCA with mild luminal irregularity and no significant change in the 50% stenosis beyond the PDA takeoff with mild 20% mid RCA stenosis and 30% smooth ostial PDA narrowing.   Low normal LV function with EF estimated approximately 50% without definitive wall motion abnormalities.  LVEDP 8 mmHg.   RECOMMENDATION: Medical therapy.  The patient has been on long-term clopidogrel without aspirin.  With recent increasing chest pain consider reinstitution of aspirin if no contraindication and increased anti-ischemic regimen. Aggressive lipid-lowering  therapy with target LDL less than 70%.  Optimal blood pressure control.  Diagnostic Dominance: Right Intervention    Patient Profile     66 y.o. male  with coronary artery disease status post PCI to  the LAD, diabetes mellitus, hypertension, dyslipidemia, nonalcoholic liver cirrhosis admitted 08/15/21  Assessment & Plan    Unstable angina with hx of CAD and PCI to LAD in past, neg troponin  --cath with non obstructing disease with patent stent- medical therapy.  Place on ASA and Plavix.   Continue BB imdur, ? Add jardiance  Cr 1.38 today ? Reason for pain.    HLD with LDL 55 this admit continue   DM-2 glucose from 120 to 156 today 93.  Resume home meds at discharge amaryl, insulin pump and metformin. And ozempic  HTN controlled  Plan for discharge post recovery from cath     For questions or updates, please contact [place] Please consult [email or web address] for contact info under        Signed, Cecilie Kicks, NP  08/18/2021, 9:20 AM

## 2021-08-18 NOTE — Discharge Summary (Signed)
Discharge Summary    Patient ID: James [name] MRN: [id number]; DOB: 10-05-1955  Admit date: 08/15/2021 Discharge date: 08/18/2021  PCP:  Colon Branch, MD   [facility] Providers Cardiologist:  Jenne Campus, MD        Discharge Diagnoses    Principal Problem:   Precordial chest pain Active Problems:   Coronary artery disease   Dyslipidemia   Hypertension   Unstable angina [facility])    Diagnostic Studies/Procedures    Cardiac cath 08/18/21   Prox Cx to Mid Cx lesion is 75% stenosed.   RPAV lesion is 50% stenosed.   RPDA lesion is 30% stenosed.   Prox LAD lesion is 50% stenosed.   Mid LAD lesion is 50% stenosed.   1st Diag lesion is 50% stenosed.   2nd Diag lesion is 70% stenosed.   Previously placed Mid LAD to Dist LAD stent (unknown type) is  widely patent.   Multivessel CAD with 50% smooth ostial narrowing of the LAD followed by 50% proximal stenosis after the proximal septal perforating artery prior to the stented segment.  The LAD stent is widely patent.  The first and second diagonal vessels are small caliber with ostial narrowing of 50 and 70%; no significant change in the previously noted 75% circumflex stenosis on a sharp bend in the vessel; large dominant RCA with mild luminal irregularity and no significant change in the 50% stenosis beyond the PDA takeoff with mild 20% mid RCA stenosis and 30% smooth ostial PDA narrowing.   Low normal LV function with EF estimated approximately 50% without definitive wall motion abnormalities.  LVEDP 8 mmHg.   RECOMMENDATION: Medical therapy.  The patient has been on long-term clopidogrel without aspirin.  With recent increasing chest pain consider reinstitution of aspirin if no contraindication and increased anti-ischemic regimen. Aggressive lipid-lowering therapy with target LDL less than 70%.  Optimal blood pressure control.   Diagnostic Dominance: Right Intervention  _____________   History of Present Illness      James [name] is a 66 y.o. male with with coronary artery disease status post PCI to the LAD in 2022, diabetes mellitus, hypertension, dyslipidemia, nonalcoholic liver cirrhosis admitted 08/15/21 with intermittent chest pain a pressure like sensation.  Comes with rest and exertion.  Last a few min then resolves.  The discomfort had increased in intensity over the last week.  It felt like prior angina.  On admit the pain had lasted an hour which was longer than the usual.  He was placed on IV heparin without relief.  EKG without acute changes.  Admitted on IV heparin with plans for cardiac cath.     Hospital Course     Consultants: none   Pt had neg troponin. NTG sl would relieve chest pain episodically.  Today he underwent cardiac cath with non obstructive CAD.  His prior stent was patent.  IV heparin stopped.  Plan will be for discharge if no further pain with ambulation.     His Imdur has been increased to 60 mg daily.  Continue ASA and plavix.  Amlodipine, high dose statin and fenofibrate and vascepa along with BB.  His Cr was 1.38 stable.  Will need BMP next week and appt soon.  He has been seen by Dr. Radford Pax and found stable for discharge.  Dr Radford Pax reviewed cath report with pt's wife.  Pt ambulated without pain.    Did the patient have an acute coronary syndrome (MI, NSTEMI, STEMI, etc) this admission?:  No  Did the patient have a percutaneous coronary intervention (stent / angioplasty)?:  No.          _____________  Discharge Vitals Blood pressure 132/90, pulse 66, temperature 98.8 F (37.1 C), temperature source Oral, resp. rate 17, height 5' 11"  (1.803 m), weight 104.8 kg, SpO2 96 %.  Filed Weights   08/17/21 0400 08/17/21 1013 08/18/21 0105  Weight: 107.2 kg 107.2 kg 104.8 kg    Labs & Radiologic Studies    CBC Recent Labs    08/17/21 0505 08/18/21 0350  WBC 4.5 6.6  HGB 12.9* 13.8  HCT 37.9* 40.8  MCV 91.5 90.7  PLT 115* 143*   Basic  Metabolic Panel Recent Labs    08/16/21 0401 08/18/21 0350  NA 138 139  K 3.9 4.3  CL 103 106  CO2 24 23  GLUCOSE 146* 83  BUN 19 15  CREATININE 1.37* 1.38*  CALCIUM 9.0 9.6   Liver Function Tests No results for input(s): AST, ALT, ALKPHOS, BILITOT, PROT, ALBUMIN in the last 72 hours.  No results for input(s): LIPASE, AMYLASE in the last 72 hours. High Sensitivity Troponin:   Recent Labs  Lab 08/15/21 1141 08/15/21 1359 08/15/21 1822 08/15/21 2028  TROPONINIHS 5 5 6 4     BNP Invalid input(s): POCBNP D-Dimer No results for input(s): DDIMER in the last 72 hours. Hemoglobin A1C Recent Labs    08/15/21 1822  HGBA1C 6.5*   Fasting Lipid Panel Recent Labs    08/16/21 0401  CHOL 118  HDL 43  LDLCALC 55  TRIG 101  CHOLHDL 2.7   Thyroid Function Tests No results for input(s): TSH, T4TOTAL, T3FREE, THYROIDAB in the last 72 hours.  Invalid input(s): FREET3 _____________  CARDIAC CATHETERIZATION  Result Date: 08/18/2021   Prox Cx to Mid Cx lesion is 75% stenosed.   RPAV lesion is 50% stenosed.   RPDA lesion is 30% stenosed.   Prox LAD lesion is 50% stenosed.   Mid LAD lesion is 50% stenosed.   1st Diag lesion is 50% stenosed.   2nd Diag lesion is 70% stenosed.   Previously placed Mid LAD to Dist LAD stent (unknown type) is  widely patent. Multivessel CAD with 50% smooth ostial narrowing of the LAD followed by 50% proximal stenosis after the proximal septal perforating artery prior to the stented segment.  The LAD stent is widely patent.  The first and second diagonal vessels are small caliber with ostial narrowing of 50 and 70%; no significant change in the previously noted 75% circumflex stenosis on a sharp bend in the vessel; large dominant RCA with mild luminal irregularity and no significant change in the 50% stenosis beyond the PDA takeoff with mild 20% mid RCA stenosis and 30% smooth ostial PDA narrowing. Low normal LV function with EF estimated approximately 50%  without definitive wall motion abnormalities.  LVEDP 8 mmHg. RECOMMENDATION: Medical therapy.  The patient has been on long-term clopidogrel without aspirin.  With recent increasing chest pain consider reinstitution of aspirin if no contraindication and increased anti-ischemic regimen. Aggressive lipid-lowering therapy with target LDL less than 70%.  Optimal blood pressure control.   DG Chest Portable 1 View  Result Date: 08/15/2021 CLINICAL DATA:  Chest pain. EXAM: PORTABLE CHEST 1 VIEW COMPARISON:  September 17, 2020. FINDINGS: The heart size and mediastinal contours are within normal limits. Both lungs are clear. Harrington rods are noted in the upper thoracic spine. Left-sided ventriculoperitoneal shunt is noted. IMPRESSION: No active disease. Electronically Signed   By: Jeneen Rinks  Murlean Caller M.D.   On: 08/15/2021 11:29   Disposition   Pt is being discharged home today in good condition.  Follow-up Plans & Appointments  Call [facility] at [phone] if any bleeding, swelling or drainage at cath site.  May shower, no tub baths for 48 hours for groin sticks. No lifting over 5 pounds for 3 days.  No Driving for 3 days   Heart Healthy Diabetic diet  Call the office for questions or concerns  Please note we adjusted your meds, increased the imdur.  Your carvedilol needs to be twice per day,  it only lasts 12 hours so that may be why you have chest pain if taking differently.  You need to be on both asprin and plavix.  This may help prevent angina.    Hold your Metformin until the 23 of Feb to prevent interaction with this medication with cath dye.  After the 22nd no problems taking   Have lab work done at office on Monday- I sent in order  to check your kidney function.      Follow-up Information     Park Liter, MD Follow up on 09/19/2021.   Specialty: Cardiology Why: at 11:00AM Contact information: [place] [id number] [phone]          [facility] Follow up on 08/25/2021.   Specialty: Cardiology Why: go to office between 8 am and 11 am to have lab drawn. Contact information: [address] [place]                 Discharge Medications   Allergies as of 08/18/2021       Reactions   Bee Venom Anaphylaxis   Hydrocodone Bit-homatrop Mbr Other (See Comments)   Hallucinations, confusion, delirium Depressed feeling   Toradol [ketorolac Tromethamine] Other (See Comments)   Hallucinations, confusion, delirium   Sulfadiazine    NDC IOEV:[id number] NDC XHBZ:[id number] NDC FBPZ:[id number]   Morphine And Related Other (See Comments)   Hallucinations, back in the 80s. States has taken vicodin before w/o problems    Sulfa Drugs Cross Reactors Rash        Medication List     STOP taking these medications    aspirin 81 MG EC tablet Replaced by: aspirin 81 MG chewable tablet   naproxen sodium 220 MG tablet Commonly known as: ALEVE       TAKE these medications    acetaminophen 325 MG tablet Commonly known as: TYLENOL Take 2 tablets (650 mg total) by mouth every 4 (four) hours as needed for headache or mild pain.   amLODipine 5 MG tablet Commonly known as: NORVASC Take 1 tablet (5 mg total) by mouth daily.   aspirin 81 MG chewable tablet Chew 1 tablet (81 mg total) by mouth daily. Start taking on: August 19, 2021 Replaces: aspirin 81 MG EC tablet   atorvastatin 80 MG tablet Commonly known as: LIPITOR Take 1 tablet (80 mg total) by mouth at bedtime.   carvedilol 12.5 MG tablet Commonly known as: COREG TAKE 1 TABLET (12.5 MG TOTAL) BY MOUTH 2 (TWO) TIMES DAILY WITH A MEAL. What changed: how much to take   clonazePAM 0.5 MG tablet Commonly known as: KLONOPIN TAKE 1 TABLET BY MOUTH EVERY NIGHT AT BEDTIME What changed: how much to take   clopidogrel 75 MG tablet Commonly known as: PLAVIX TAKE 1 TABLET (75 MG  TOTAL) BY  MOUTH DAILY.   DULoxetine 60 MG capsule Commonly known as: CYMBALTA TAKE 2 CAPSULES (120 MG TOTAL) BY MOUTH DAILY. What changed:  how much to take how to take this when to take this   EPINEPHrine 0.3 mg/0.3 mL Soaj injection Commonly known as: EpiPen 2-Pak Inject 0.3 mLs (0.3 mg total) into the muscle once as needed for up to 1 dose for anaphylaxis.   fenofibrate micronized 134 MG capsule Commonly known as: LOFIBRA TAKE 1 CAPSULE BY MOUTH THREE TIMES WEEKLY What changed:  how much to take how to take this when to take this   FreeStyle Libre 14 Day Sensor Misc Apply 1 sensor every 14 (fourteen) days. What changed: additional instructions   gabapentin 600 MG tablet Commonly known as: NEURONTIN TAKE 1 TABLET (600 MG TOTAL) BY MOUTH 2 (TWO) TIMES DAILY. What changed:  how much to take how to take this when to take this   glimepiride 4 MG tablet Commonly known as: AMARYL TAKE 1 TABLET BY MOUTH ONCE DAILY BEFORE BREAKFAST What changed:  how much to take when to take this   HumaLOG 100 UNIT/ML injection Generic drug: insulin lispro Use pump to inject 50 units once daily   Humira Pen 40 MG/0.8ML Pnkt Generic drug: Adalimumab INJECT 40 MG (0.8 ML) UNDER THE SKIN EVERY OTHER WEEK. What changed:  how much to take how to take this when to take this   isosorbide mononitrate 60 MG 24 hr tablet Commonly known as: IMDUR Take 1 tablet (60 mg total) by mouth daily. Start taking on: August 19, 2021 What changed:  medication strength how much to take   metFORMIN 500 MG tablet Commonly known as: GLUCOPHAGE Take 2 tablets (1,000 mg total) by mouth 2 (two) times daily. Start taking on: August 21, 2021 What changed: These instructions start on August 21, 2021. If you are unsure what to do until then, ask your doctor or other care provider.   nitroGLYCERIN 0.4 MG SL tablet Commonly known as: NITROSTAT Place 1 tablet (0.4 mg total) under the tongue every 5  (five) minutes for 3 doses as needed for chest pain.   Omnipod 5 G6 Pod (Gen 5) Misc CHANGE EVERY 72 HOURS AS DIRECTED   Ozempic (2 MG/DOSE) 8 MG/3ML Sopn Generic drug: Semaglutide (2 MG/DOSE) Inject 2 mg into the skin weekly   pantoprazole 40 MG tablet Commonly known as: PROTONIX Take 1 tablet (40 mg total) by mouth daily.   Vascepa 1 g capsule Generic drug: icosapent Ethyl Take 2 capsules (2 g total) by mouth 2 (two) times daily.           Outstanding Labs/Studies   BMP in 1 week   Duration of Discharge Encounter   Greater than 30 minutes including physician time.  Signed, Cecilie Kicks, NP 08/18/2021, 12:02 PM

## 2021-08-18 NOTE — Progress Notes (Addendum)
6666: Patient back from cath lab. Bed rest until 1300. Patient drowsy. Urinal at bedside. R femoral site CDI.   1034: Per Cardiology NP, ok to stop heparin.   1335: Bedrest orders expired, RN walked patient in hallway. Denied any pain. R femoral site soft, CDI before and after walk in hallway.

## 2021-08-19 ENCOUNTER — Telehealth: Payer: Self-pay

## 2021-08-19 MED FILL — Verapamil HCl IV Soln 2.5 MG/ML: INTRAVENOUS | Qty: 2 | Status: AC

## 2021-08-19 NOTE — Telephone Encounter (Signed)
Transition Care Management Unsuccessful Follow-up Telephone Call  Date of discharge and from where:  08/18/2021-[place]  Attempts:  1st Attempt  Reason for unsuccessful TCM follow-up call:  No answer/busy

## 2021-08-20 ENCOUNTER — Other Ambulatory Visit (HOSPITAL_COMMUNITY): Payer: Self-pay

## 2021-08-21 ENCOUNTER — Ambulatory Visit (INDEPENDENT_AMBULATORY_CARE_PROVIDER_SITE_OTHER): Payer: 59 | Admitting: Internal Medicine

## 2021-08-21 ENCOUNTER — Encounter: Payer: Self-pay | Admitting: Internal Medicine

## 2021-08-21 VITALS — BP 126/76 | HR 76 | Temp 97.5°F | Resp 18 | Ht 71.0 in | Wt 234.5 lb

## 2021-08-21 DIAGNOSIS — I25118 Atherosclerotic heart disease of native coronary artery with other forms of angina pectoris: Secondary | ICD-10-CM

## 2021-08-21 DIAGNOSIS — E1140 Type 2 diabetes mellitus with diabetic neuropathy, unspecified: Secondary | ICD-10-CM

## 2021-08-21 LAB — CBC WITH DIFFERENTIAL/PLATELET
Basophils Absolute: 0 10*3/uL (ref 0.0–0.1)
Basophils Relative: 0.3 % (ref 0.0–3.0)
Eosinophils Absolute: 0.1 10*3/uL (ref 0.0–0.7)
Eosinophils Relative: 2.1 % (ref 0.0–5.0)
HCT: 40.2 % (ref 39.0–52.0)
Hemoglobin: 13.4 g/dL (ref 13.0–17.0)
Lymphocytes Relative: 28.2 % (ref 12.0–46.0)
Lymphs Abs: 1.2 10*3/uL (ref 0.7–4.0)
MCHC: 33.4 g/dL (ref 30.0–36.0)
MCV: 92.6 fl (ref 78.0–100.0)
Monocytes Absolute: 0.3 10*3/uL (ref 0.1–1.0)
Monocytes Relative: 7.6 % (ref 3.0–12.0)
Neutro Abs: 2.7 10*3/uL (ref 1.4–7.7)
Neutrophils Relative %: 61.8 % (ref 43.0–77.0)
Platelets: 121 10*3/uL — ABNORMAL LOW (ref 150.0–400.0)
RBC: 4.34 Mil/uL (ref 4.22–5.81)
RDW: 16.6 % — ABNORMAL HIGH (ref 11.5–15.5)
WBC: 4.4 10*3/uL (ref 4.0–10.5)

## 2021-08-21 LAB — BASIC METABOLIC PANEL
BUN: 19 mg/dL (ref 6–23)
CO2: 26 mEq/L (ref 19–32)
Calcium: 9.5 mg/dL (ref 8.4–10.5)
Chloride: 103 mEq/L (ref 96–112)
Creatinine, Ser: 1.43 mg/dL (ref 0.40–1.50)
GFR: 52.16 mL/min — ABNORMAL LOW (ref >60.00)
Glucose, Bld: 342 mg/dL — ABNORMAL HIGH (ref 70–99)
Potassium: 4.2 mEq/L (ref 3.5–5.1)
Sodium: 137 mEq/L (ref 135–145)

## 2021-08-21 NOTE — Patient Instructions (Addendum)
Take the medications as recommended including Ozempic.  Be sure you have fresh nitroglycerin with you at all times  Check the  blood pressure regularly BP GOAL is between 110/65 and  135/85. If it is consistently higher or lower, let me know   GO TO THE LAB : Get the blood work     [place], [place] back for a checkup by June 2023

## 2021-08-21 NOTE — Progress Notes (Signed)
Subjective:    Patient ID: James [name], male    DOB: [birth year], 66 y.o.   MRN: [id number]  DOS:  08/21/2021 Type of visit - description: TCM  Admitted to hospital and discharged 08/18/2021. Presented with chest pain, catheterization done, multivessel CAD, normal LV function, Rx medical therapy. Since he left the hospital, reports he is doing okay. Denies further chest pain. Area of cardiac catheterization (groin) with no swelling or pain.  Denies back pain. Not taking insulin. Ambulatory CBGs okay   Review of Systems See above   Past Medical History:  Diagnosis Date   Abnormal cardiac CT angiography    Acid reflux    Annual physical exam 04/08/2015   Arthritis    Atypical chest pain 06/13/2020   Blood transfusion without reported diagnosis    Body mass index (BMI) 35.0-35.9, adult 04/05/2019   Chronic fatigue 01/28/2015   Chronic headaches    on cymbalta   Chronic migraine w/o aura, not intractable, w/o stat migr 10/24/2018   Cirrhosis ([place])    Colon polyps    Complication of anesthesia    problems waking up from anesthesia   Coronary artery disease 01/23/2019   Depression    on cymbalta   Diabetes mellitus with neuropathy ([place])    Diabetes with neuropathy 04/25/2013   Diverticulitis 03/2013   Dyslipidemia 05/29/2019   Eczema    Elevated LFTs    Epidural lipomatosis 10/05/2018   Essential (primary) hypertension 04/05/2019   Essential hypertension 10/10/2019   Fatty liver    GERD (gastroesophageal reflux disease) 04/28/2011   H/O craniotomy 05/07/2015   Headache 04/28/2011   Hepatitis 10/2017   NASH cirrhosis   History of kidney stones    Hyperlipidemia    Hypersomnia with sleep apnea 01/28/2015   Hypertension    IDA (iron deficiency anemia) 01/24/2019   Idiopathic intracranial hypertension 01/14/2017   Insomnia 04/26/2013   Kidney stone    Liver cirrhosis secondary to NASH (nonalcoholic steatohepatitis) ([place]) 01/02/2016   Low back pain 04/05/2019   Lower back injury  08/14/2019   Morbid obesity ([place])    Neuromuscular disorder ([place])    neuropathy   Neuropathy    Nonalcoholic steatohepatitis 4/0/9811   Obstructive hydrocephalus ([place]) 01/28/2015   OSA -- dx ~ 2012, cpap intolerant 09/04/2014    dx ~ 2012, cpap intolerant    PCP NOTES >>> 04/08/2015   Post-op pain 03/19/2019   Post-traumatic hydrocephalus ([place])    s/p shunts x 2 (first got infected )   Presence of cerebrospinal fluid drainage device 07/28/2011   Psoriasis    sees Dr [name]   Psoriatic arthritis ([place])    REM behavioral disorder 01/14/2017   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   see's Dr.Ortmann   Severe obesity (BMI >= 40) ([place]) 01/28/2015   SI (sacroiliac) joint dysfunction 08/14/2019   Sigmoid diverticulitis 04/25/2013   Sleep apnea    no CPAP      Spondylolisthesis, lumbar region 03/16/2019   Stomach ulcer    Testosterone deficiency 04/28/2011   VP (ventriculoperitoneal) shunt status 07/31/2020    Past Surgical History:  Procedure Laterality Date   [place]     VP shunts placed in 2007   CHOLECYSTECTOMY N/A 08/25/2017   Procedure: LAPAROSCOPIC CHOLECYSTECTOMY WITH INTRAOPERATIVE CHOLANGIOGRAM;  Surgeon: [name], MD;  Location: [place];  Service: General;  Laterality: N/A;   COLONOSCOPY     CORONARY STENT INTERVENTION N/A 09/27/2020  Procedure: CORONARY STENT INTERVENTION;  Surgeon: Jettie Booze, MD;  Location: [place] [facility];  Service: Cardiovascular;  Laterality: N/A;   INTRAVASCULAR ULTRASOUND/IVUS N/A 09/27/2020   Procedure: Intravascular Ultrasound/IVUS;  Surgeon: Jettie Booze, MD;  Location: [place] [facility];  Service: Cardiovascular;  Laterality: N/A;   JOINT REPLACEMENT     total hip   LEFT HEART CATH N/A 09/27/2020   Procedure: Left Heart Cath;  Surgeon: Jettie Booze, MD;  Location: [place] [facility];  Service: Cardiovascular;  Laterality: N/A;   LEFT HEART CATH AND CORONARY ANGIOGRAPHY N/A 09/24/2020    Procedure: LEFT HEART CATH AND CORONARY ANGIOGRAPHY;  Surgeon: Jettie Booze, MD;  Location: [place] [facility];  Service: Cardiovascular;  Laterality: N/A;   LEFT HEART CATH AND CORONARY ANGIOGRAPHY N/A 08/18/2021   Procedure: LEFT HEART CATH AND CORONARY ANGIOGRAPHY;  Surgeon: Troy Sine, MD;  Location: [place] [facility];  Service: Cardiovascular;  Laterality: N/A;   LUMBAR FUSION  03/16/2019   SHOULDER SURGERY Left 2010   TOE SURGERY Left 2018   TONSILLECTOMY     as a child   TOTAL HIP ARTHROPLASTY Left 2011   UPPER GASTROINTESTINAL ENDOSCOPY  01/04/2020   VENTRICULOPERITONEAL SHUNT  2007   x2    Current Outpatient Medications  Medication Instructions   acetaminophen (TYLENOL) 650 mg, Oral, Every 4 hours PRN   Adalimumab (HUMIRA PEN) 40 MG/0.8ML PNKT INJECT 40 MG (0.8 ML) UNDER THE SKIN EVERY OTHER WEEK.   amLODipine (NORVASC) 5 mg, Oral, Daily   aspirin 81 mg, Oral, Daily   atorvastatin (LIPITOR) 80 mg, Oral, Daily at bedtime   carvedilol (COREG) 12.5 MG tablet TAKE 1 TABLET (12.5 MG TOTAL) BY MOUTH 2 (TWO) TIMES DAILY WITH A MEAL.   clonazePAM (KLONOPIN) 0.5 MG tablet TAKE 1 TABLET BY MOUTH EVERY NIGHT AT BEDTIME   clopidogrel (PLAVIX) 75 MG tablet TAKE 1 TABLET (75 MG TOTAL) BY MOUTH DAILY.   Continuous Blood Gluc Sensor (FREESTYLE LIBRE 14 DAY SENSOR) MISC Apply 1 sensor every 14 (fourteen) days.   DULoxetine (CYMBALTA) 60 MG capsule TAKE 2 CAPSULES (120 MG TOTAL) BY MOUTH DAILY.   EPINEPHrine (EPIPEN 2-PAK) 0.3 mg, Intramuscular, Once PRN   fenofibrate micronized (LOFIBRA) 134 MG capsule TAKE 1 CAPSULE BY MOUTH THREE TIMES WEEKLY   gabapentin (NEURONTIN) 600 MG tablet TAKE 1 TABLET (600 MG TOTAL) BY MOUTH 2 (TWO) TIMES DAILY.   glimepiride (AMARYL) 4 MG tablet TAKE 1 TABLET BY MOUTH ONCE DAILY BEFORE BREAKFAST   Insulin Disposable Pump (OMNIPOD DASH PODS, GEN 4,) MISC CHANGE EVERY 72 HOURS AS DIRECTED   insulin lispro (HUMALOG) 100 UNIT/ML injection Use pump to  inject 50 units once daily   isosorbide mononitrate (IMDUR) 60 mg, Oral, Daily   metFORMIN (GLUCOPHAGE) 1,000 mg, Oral, 2 times daily   nitroGLYCERIN (NITROSTAT) 0.4 MG SL tablet Place 1 tablet (0.4 mg total) under the tongue every 5 (five) minutes for 3 doses as needed for chest pain.   pantoprazole (PROTONIX) 40 mg, Oral, Daily   Semaglutide, 2 MG/DOSE, (OZEMPIC, 2 MG/DOSE,) 8 MG/3ML SOPN Inject 2 mg into the skin weekly   Vascepa 2 g, Oral, 2 times daily       Objective:   Physical Exam BP 126/76 (BP Location: Left Arm, Patient Position: Sitting, Cuff Size: Normal)    Pulse 76    Temp (!) 97.5 F (36.4 C) (Oral)    Resp 18    Ht 5' 11"  (1.803 m)  Wt 234 lb 8 oz (106.4 kg)    SpO2 97%    BMI 32.71 kg/m  General:   Well developed, NAD, BMI noted. HEENT:  Normocephalic . Face symmetric, atraumatic Lungs:  CTA B Normal respiratory effort, no intercostal retractions, no accessory muscle use. Heart: RRR,  no murmur.  Lower extremities: no pretibial edema bilaterally  Skin: Not pale. Not jaundice Neurologic:  alert & oriented X3.  Speech normal, gait appropriate for age and unassisted Psych--  Cognition and judgment appear intact.  Cooperative with normal attention span and concentration.  Behavior appropriate. No anxious or depressed appearing.      Assessment      Assessment  DM - Dr [name] Neuropathy (x years, rx gaba 05-2014, w/u 11-2014  RPR neg, vit D-B12-Folic Acid wnl ); Saw Dr Posey Pronto, N[facility] [phone] (see results) HTN: History of AKI with ARBs  CRI, sees nephrology, etiology felt to be hHyperglycemia, intermittent NSAIDs, contrast exposures Hyperlipidemia (TG in the 500s 2016) OSA , dx 2012, sleep study again 02-2015 Dr Dohmeier--> severe OSA, intolerant to CPAP Depression, insomnia: on Cymbalta NEURO: --Chronic headaches :on Cymbalta  --Posttraumatic hydrocephalus s/p 2 shunts (first got infected) MSK: on disability d/t back pain- HAs GI:  --GERD, diverticulitis  2014, h/o PUD -- NASH with cirrhosis per GI note 10/2017, s/p Hep A/B shots --Anemia: - felt to be d/t   GAVE (gastric antral vascular ectasia) and a inflammatory polyp, s/p  EGD 10/2018    -Work-up repeated 10/2019: EGD: GAVE  versus portal hypertensive gastropathy. Colonoscopy polyps.  Tubular adenoma. Gastric BX negative H. pylori, reactive changes. Psoriasis, psoriatic arthritis: used  HUMIRA  CV: +FH CAD brother MI age 85 Abnormal EKG, saw cardiology 01-2019, echo essentially negative, perfusion stress test 03/01/2019 (-) CAD: CP, stent 09-2020 H/o urolithiasis Hypogonadism  Dx 2012, normal T 11-2014 (on no RX)  PLAN CAD: Recently admitted with chest pain, had a cardiac catheterization, Rx medical management.  Currently with no chest pain.  Encouraged to have fresh nitroglycerin with him at all times.  He plans to follow-up with cardiology as recommended. Check BMP and CBC DM: follow-up by Dr. [name].  Last A1c 08/15/2021: 6.5. He quit insulin and Ozempic months ago, taking glimepiride regularly and metformin (holding metformin temporarily due to recent catheterization). Ambulatory CBGs 90-130. I deleted insulin from his medication list, encouraged to go back to Ozempic because multiple cardiovascular benefits.  Watch for low CBGs, encouraged to follow-up with Endo. RTC 11/2021.    This visit occurred during the SARS-CoV-2 public health emergency.  Safety protocols were in place, including screening questions prior to the visit, additional usage of staff PPE, and extensive cleaning of exam room while observing appropriate contact time as indicated for disinfecting solutions.

## 2021-08-22 NOTE — Assessment & Plan Note (Signed)
CAD: Recently admitted with chest pain, had a cardiac catheterization, Rx medical management.  Currently with no chest pain.  Encouraged to have fresh nitroglycerin with him at all times.  He plans to follow-up with cardiology as recommended. Check BMP and CBC DM: follow-up by Dr. [name].  Last A1c 08/15/2021: 6.5. He quit insulin and Ozempic months ago, taking glimepiride regularly and metformin (holding metformin temporarily due to recent catheterization). Ambulatory CBGs 90-130. I deleted insulin from his medication list, encouraged to go back to Ozempic because multiple cardiovascular benefits.  Watch for low CBGs, encouraged to follow-up with Endo. RTC 11/2021.

## 2021-08-24 DIAGNOSIS — Z20828 Contact with and (suspected) exposure to other viral communicable diseases: Secondary | ICD-10-CM | POA: Diagnosis not present

## 2021-08-28 ENCOUNTER — Other Ambulatory Visit (HOSPITAL_COMMUNITY): Payer: Self-pay

## 2021-08-29 ENCOUNTER — Other Ambulatory Visit (HOSPITAL_COMMUNITY): Payer: Self-pay

## 2021-09-01 ENCOUNTER — Other Ambulatory Visit (HOSPITAL_COMMUNITY): Payer: Self-pay

## 2021-09-01 ENCOUNTER — Other Ambulatory Visit: Payer: Self-pay | Admitting: Endocrinology

## 2021-09-01 ENCOUNTER — Other Ambulatory Visit (HOSPITAL_BASED_OUTPATIENT_CLINIC_OR_DEPARTMENT_OTHER): Payer: Self-pay

## 2021-09-02 ENCOUNTER — Other Ambulatory Visit: Payer: Self-pay | Admitting: Endocrinology

## 2021-09-02 ENCOUNTER — Other Ambulatory Visit (HOSPITAL_BASED_OUTPATIENT_CLINIC_OR_DEPARTMENT_OTHER): Payer: Self-pay

## 2021-09-02 ENCOUNTER — Encounter: Payer: Self-pay | Admitting: Endocrinology

## 2021-09-02 DIAGNOSIS — E1165 Type 2 diabetes mellitus with hyperglycemia: Secondary | ICD-10-CM

## 2021-09-03 ENCOUNTER — Other Ambulatory Visit (HOSPITAL_BASED_OUTPATIENT_CLINIC_OR_DEPARTMENT_OTHER): Payer: Self-pay

## 2021-09-03 ENCOUNTER — Other Ambulatory Visit (HOSPITAL_COMMUNITY): Payer: Self-pay

## 2021-09-03 MED ORDER — FREESTYLE LIBRE 14 DAY SENSOR MISC
1.0000 [IU] | 0 refills | Status: DC
  Filled 2021-09-03: qty 2, 28d supply, fill #0

## 2021-09-04 ENCOUNTER — Ambulatory Visit (INDEPENDENT_AMBULATORY_CARE_PROVIDER_SITE_OTHER): Payer: 59 | Admitting: Endocrinology

## 2021-09-04 ENCOUNTER — Encounter: Payer: Self-pay | Admitting: Endocrinology

## 2021-09-04 ENCOUNTER — Other Ambulatory Visit: Payer: Self-pay

## 2021-09-04 VITALS — BP 122/82 | HR 86 | Ht 71.0 in | Wt 236.4 lb

## 2021-09-04 DIAGNOSIS — E1165 Type 2 diabetes mellitus with hyperglycemia: Secondary | ICD-10-CM | POA: Diagnosis not present

## 2021-09-04 DIAGNOSIS — E782 Mixed hyperlipidemia: Secondary | ICD-10-CM | POA: Diagnosis not present

## 2021-09-04 DIAGNOSIS — I200 Unstable angina: Secondary | ICD-10-CM

## 2021-09-04 LAB — POCT GLUCOSE (DEVICE FOR HOME USE): POC Glucose: 130 mg/dl — AB (ref 70–99)

## 2021-09-04 NOTE — Patient Instructions (Addendum)
Take 1/2 Glimeperide before lunch ? ?Take 2 Metformin in am and 1 in pm ? ?Check blood sugars on waking up days a week ? ?Also check blood sugars about 2 hours after meals and do this after different meals by rotation ? ?Recommended blood sugar levels on waking up are 90-130 and about 2 hours after meal is 130-160 ? ?Please bring your blood sugar monitor to each visit, thank you ? ?

## 2021-09-04 NOTE — Progress Notes (Signed)
Patient ID: James [name], male   DOB: Feb 19, 1956, 65 y.o.   MRN: [id number]           Reason for Appointment: Follow-up for Type 2 Diabetes   History of Present Illness:          Date of diagnosis of type 2 diabetes mellitus:  ?  2014      Background history:  He is not clear how his diabetes was diagnosed, likely on routine testing. Initially had been treated with metformin and also tried on Amaryl Patient had progressive increase in his blood sugars since 1/17 with stopping metformin and being on the regimen of Amaryl and Januvia, he thinks his blood sugars went up to 601.  He was then started on basal bolus insulin   Lowest A1c was 6.8 previously  OMNIPOD insulin pump settings as follows Basal rates: 12 AM-6 AM = 0.1, 6 AM-11 AM = 0.5, 11 AM- 9:30 PM = 1.0 and 9:30 PM-12 AM = 1.2 Correction 1: 50 carbohydrate coverage 1: 1   Recent history:   INSULIN: None   Non-insulin hypoglycemic drugs the patient is taking are:  metformin 1000 mg twice daily, Ozempic 1.0 mg weekly, Amaryl 4 mg daily   His A1c is 6.5 last   Current management, blood sugar patterns and problems identified:  He was last seen in November  Now taking Amaryl which apparently was not on his medication list on the last visit  Insulin had been stopped previously because of tendency to low sugars  Since his hospitalization for angina he has tried to improve his diet and only rarely drinking regular soft drinks He called in for urgent appointment because of symptoms of low sugars overnight and blood sugars as low as 48 early morning  He is not clear whether he is taking his Amaryl in the morning or evening but his blood sugars appear to be much lower overnight and gradually rising during the day  Although his metformin was also REDUCED on the last couple of visits he says he is taking the full dose of 2000 mg a day  He could not tolerate 2 mg Ozempic and is continuing the 1 mg dose using half dial of  the 2 mg pen  No hypoglycemia overnight last night However his blood sugar data is incomplete and has complete data for only 3 or 4 days in the last week  Blood sugar patterns from his [facility] [place] are as follows  Blood sugars are showing significant low normal or low sugars around 4-6 AM and then gradually increasing the rest of the day HIGHEST average appears to be after lunch of about 140 and as much as average of 155 at midnight OVERALL average 122   PREVIOUS data:  CGM use % of time 51  2-week average/GV 138  Time in range 89%  % Time Above 180 10+1  % Time above 250   % Time Below 70      PRE-MEAL Fasting Lunch Dinner Bedtime Overall  Glucose range:       Averages: 120       POST-MEAL PC Breakfast PC Lunch PC Dinner  Glucose range:     Averages:  159 154    Previously   Side  effects from medications have been: None  Compliance with the medical regimen: Fair  Glucose monitoring:   Glucometer:  Freestyle libre 14-day  Recent CGM data interpretation as follows from 8/15 through 8/28  Blood sugar monitoring  is significantly more complete compared to the last time however he has been out of his sensor for the last 3 weeks HYPERGLYCEMIA occurs only late at night after about 10 PM with blood sugar ranging generally between 160-200 Postprandial hyperglycemia during the day as only occasional late morning or afternoon However overall rise in blood sugar compared to Premeal readings is not excessive Blood sugars are excellent early morning and averaging in the 120s between 4-8 AM No hypoglycemia In the first week of the recording he was having somewhat more high readings after meals including breakfast Overall blood sugars are lower compared to last visit when he was only having 56% within target range  CGM use % of time 70  2-week average/GV 150/24  Time in range     81   %  % Time Above 180 18+1  % Time above 250   % Time Below 70      PRE-MEAL Fasting Lunch Dinner  Bedtime Overall  Glucose range:       Averages: 122 161  194    POST-MEAL PC Breakfast PC Lunch PC Dinner  Glucose range:     Averages: 167 161 159      Self-care:   Meal times are:  Breakfast variable, lunch: 12-3 PM Dinner: 6 PM    Dietician visit, most recent:09/2015               CDE consultation: 12/2018  Weight history:  Wt Readings from Last 3 Encounters:  09/04/21 236 lb 6.4 oz (107.2 kg)  08/21/21 234 lb 8 oz (106.4 kg)  08/18/21 231 lb (104.8 kg)    Glycemic control:   Lab Results  Component Value Date   HGBA1C 6.5 (H) 08/15/2021   HGBA1C 6.8 (H) 03/04/2021   HGBA1C 6.8 (H) 10/24/2020   Lab Results  Component Value Date   MICROALBUR 5.5 (H) 03/04/2021   LDLCALC 55 08/16/2021   CREATININE 1.43 08/21/2021    Office Visit on 09/04/2021  Component Date Value Ref Range Status   POC Glucose 09/04/2021 130 (A)  70 - 99 mg/dl Final       Allergies as of 09/04/2021       Reactions   Bee Venom Anaphylaxis   Hydrocodone Bit-homatrop Mbr Other (See Comments)   Hallucinations, confusion, delirium Depressed feeling   Toradol [ketorolac Tromethamine] Other (See Comments)   Hallucinations, confusion, delirium   Sulfadiazine    NDC JOIN:[id number] NDC JGGE:[id number] NDC TKPT:[id number]   Morphine And Related Other (See Comments)   Hallucinations, back in the 80s. States has taken vicodin before w/o problems    Sulfa Drugs Cross Reactors Rash        Medication List        Accurate as of September 04, 2021  4:46 PM. If you have any questions, ask your nurse or doctor.          acetaminophen 325 MG tablet Commonly known as: TYLENOL Take 2 tablets (650 mg total) by mouth every 4 (four) hours as needed for headache or mild pain.   amLODipine 5 MG tablet Commonly known as: NORVASC Take 1 tablet (5 mg total) by mouth daily.   aspirin 81 MG chewable tablet Chew 1 tablet (81 mg total) by mouth daily.   atorvastatin 80 MG tablet Commonly known as:  LIPITOR Take 1 tablet (80 mg total) by mouth at bedtime.   carvedilol 12.5 MG tablet Commonly known as: COREG TAKE 1 TABLET (12.5 MG TOTAL) BY MOUTH 2 (TWO)  TIMES DAILY WITH A MEAL. What changed: how much to take   clonazePAM 0.5 MG tablet Commonly known as: KLONOPIN TAKE 1 TABLET BY MOUTH EVERY NIGHT AT BEDTIME What changed: how much to take   clopidogrel 75 MG tablet Commonly known as: PLAVIX TAKE 1 TABLET (75 MG TOTAL) BY MOUTH DAILY.   DULoxetine 60 MG capsule Commonly known as: CYMBALTA TAKE 2 CAPSULES (120 MG TOTAL) BY MOUTH DAILY. What changed:  how much to take how to take this when to take this   EPINEPHrine 0.3 mg/0.3 mL Soaj injection Commonly known as: EpiPen 2-Pak Inject 0.3 mLs (0.3 mg total) into the muscle once as needed for up to 1 dose for anaphylaxis.   fenofibrate micronized 134 MG capsule Commonly known as: LOFIBRA TAKE 1 CAPSULE BY MOUTH THREE TIMES WEEKLY What changed:  how much to take how to take this when to take this   FreeStyle Libre 14 Day Sensor Misc Apply 1 sensor every 14 (fourteen) days.   gabapentin 600 MG tablet Commonly known as: NEURONTIN TAKE 1 TABLET (600 MG TOTAL) BY MOUTH 2 (TWO) TIMES DAILY. What changed:  how much to take how to take this when to take this   glimepiride 4 MG tablet Commonly known as: AMARYL TAKE 1 TABLET BY MOUTH ONCE DAILY BEFORE BREAKFAST What changed:  how much to take when to take this   Humira Pen 40 MG/0.8ML Pnkt Generic drug: Adalimumab INJECT 40 MG (0.8 ML) UNDER THE SKIN EVERY OTHER WEEK. What changed:  how much to take how to take this when to take this   isosorbide mononitrate 60 MG 24 hr tablet Commonly known as: IMDUR Take 1 tablet (60 mg total) by mouth daily.   metFORMIN 500 MG tablet Commonly known as: GLUCOPHAGE Take 2 tablets (1,000 mg total) by mouth 2 (two) times daily.   nitroGLYCERIN 0.4 MG SL tablet Commonly known as: NITROSTAT Place 1 tablet (0.4 mg total) under  the tongue every 5 (five) minutes for 3 doses as needed for chest pain.   Ozempic (2 MG/DOSE) 8 MG/3ML Sopn Generic drug: Semaglutide (2 MG/DOSE) Inject 2 mg into the skin weekly   pantoprazole 40 MG tablet Commonly known as: PROTONIX Take 1 tablet (40 mg total) by mouth daily.   Vascepa 1 g capsule Generic drug: icosapent Ethyl Take 2 capsules (2 g total) by mouth 2 (two) times daily.        Allergies:  Allergies  Allergen Reactions   Bee Venom Anaphylaxis   Hydrocodone Bit-Homatrop Mbr Other (See Comments)    Hallucinations, confusion, delirium Depressed feeling   Toradol [Ketorolac Tromethamine] Other (See Comments)    Hallucinations, confusion, delirium   Sulfadiazine     NDC PYKD:[id number] NDC JQBH:[id number] NDC BDZH:[id number]   Morphine And Related Other (See Comments)    Hallucinations, back in the 80s. States has taken vicodin before w/o problems    Sulfa Drugs Cross Reactors Rash    Past Medical History:  Diagnosis Date   Abnormal cardiac CT angiography    Acid reflux    Annual physical exam 04/08/2015   Arthritis    Atypical chest pain 06/13/2020   Blood transfusion without reported diagnosis    Body mass index (BMI) 35.0-35.9, adult 04/05/2019   Chronic fatigue 01/28/2015   Chronic headaches    on cymbalta   Chronic migraine w/o aura, not intractable, w/o stat migr 10/24/2018   Cirrhosis (HCC)    Colon polyps    Complication of anesthesia  problems waking up from anesthesia   Coronary artery disease 01/23/2019   Depression    on cymbalta   Diabetes mellitus with neuropathy ([place])    Diabetes with neuropathy 04/25/2013   Diverticulitis 03/2013   Dyslipidemia 05/29/2019   Eczema    Elevated LFTs    Epidural lipomatosis 10/05/2018   Essential (primary) hypertension 04/05/2019   Essential hypertension 10/10/2019   Fatty liver    GERD (gastroesophageal reflux disease) 04/28/2011   H/O craniotomy 05/07/2015   Headache 04/28/2011   Hepatitis  10/2017   NASH cirrhosis   History of kidney stones    Hyperlipidemia    Hypersomnia with sleep apnea 01/28/2015   Hypertension    IDA (iron deficiency anemia) 01/24/2019   Idiopathic intracranial hypertension 01/14/2017   Insomnia 04/26/2013   Kidney stone    Liver cirrhosis secondary to NASH (nonalcoholic steatohepatitis) ([place]) 01/02/2016   Low back pain 04/05/2019   Lower back injury 08/14/2019   Morbid obesity ([place])    Neuromuscular disorder ([place])    neuropathy   Neuropathy    Nonalcoholic steatohepatitis 11/02/9036   Obstructive hydrocephalus ([place]) 01/28/2015   OSA -- dx ~ 2012, cpap intolerant 09/04/2014    dx ~ 2012, cpap intolerant    PCP NOTES >>> 04/08/2015   Post-op pain 03/19/2019   Post-traumatic hydrocephalus ([place])    s/p shunts x 2 (first got infected )   Presence of cerebrospinal fluid drainage device 07/28/2011   Psoriasis    sees Dr [name]   Psoriatic arthritis ([place])    REM behavioral disorder 01/14/2017   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   see's Dr.Ortmann   Severe obesity (BMI >= 40) ([place]) 01/28/2015   SI (sacroiliac) joint dysfunction 08/14/2019   Sigmoid diverticulitis 04/25/2013   Sleep apnea    no CPAP      Spondylolisthesis, lumbar region 03/16/2019   Stomach ulcer    Testosterone deficiency 04/28/2011   VP (ventriculoperitoneal) shunt status 07/31/2020    Past Surgical History:  Procedure Laterality Date   [place]     VP shunts placed in 2007   CHOLECYSTECTOMY N/A 08/25/2017   Procedure: LAPAROSCOPIC CHOLECYSTECTOMY WITH INTRAOPERATIVE CHOLANGIOGRAM;  Surgeon: [name], MD;  Location: [facility];  Service: General;  Laterality: N/A;   COLONOSCOPY     CORONARY STENT INTERVENTION N/A 09/27/2020   Procedure: CORONARY STENT INTERVENTION;  Surgeon: Jettie Booze, MD;  Location: [facility];  Service: Cardiovascular;  Laterality: N/A;   INTRAVASCULAR ULTRASOUND/IVUS N/A 09/27/2020   Procedure: Intravascular  Ultrasound/IVUS;  Surgeon: Jettie Booze, MD;  Location: [facility];  Service: Cardiovascular;  Laterality: N/A;   JOINT REPLACEMENT     total hip   LEFT HEART CATH N/A 09/27/2020   Procedure: Left Heart Cath;  Surgeon: Jettie Booze, MD;  Location: [facility];  Service: Cardiovascular;  Laterality: N/A;   LEFT HEART CATH AND CORONARY ANGIOGRAPHY N/A 09/24/2020   Procedure: LEFT HEART CATH AND CORONARY ANGIOGRAPHY;  Surgeon: Jettie Booze, MD;  Location: [facility];  Service: Cardiovascular;  Laterality: N/A;   LEFT HEART CATH AND CORONARY ANGIOGRAPHY N/A 08/18/2021   Procedure: LEFT HEART CATH AND CORONARY ANGIOGRAPHY;  Surgeon: Troy Sine, MD;  Location: [facility];  Service: Cardiovascular;  Laterality: N/A;   LUMBAR FUSION  03/16/2019   SHOULDER SURGERY Left 2010   TOE SURGERY Left 2018   TONSILLECTOMY  as a child   TOTAL HIP ARTHROPLASTY Left 2011   UPPER GASTROINTESTINAL ENDOSCOPY  01/04/2020   VENTRICULOPERITONEAL SHUNT  2007   x2    Family History  Problem Relation Age of Onset   Other Mother    Lung cancer Father        alive, former smoker    Heart disease Brother        MI age 71   Other Brother        Murdered   Down syndrome Son    Diabetes Neg Hx    Prostate cancer Neg Hx    Colon cancer Neg Hx    Stomach cancer Neg Hx    Pancreatic cancer Neg Hx    Liver disease Neg Hx     Social History:  reports that he has never smoked. He has never used smokeless tobacco. He reports that he does not drink alcohol and does not use drugs.    Review of Systems      HYPERTENSION: On carvedilol with good control Hypertension managed by his PCP   BP Readings from Last 3 Encounters:  09/04/21 122/82  08/21/21 126/76  08/18/21 112/72   RENAL dysfunction: Has had stable creatinine levels, now appearing to be more normal No history of microalbuminuria  Lab Results  Component Value Date   CREATININE 1.43  08/21/2021   CREATININE 1.38 (H) 08/18/2021   CREATININE 1.37 (H) 08/16/2021    Lipid history: He is on Lipitor  20 mg  Is on fenofibrate 3 times a week for high triglycerides, Was given Vascepa recently again in the hospital but he says it causes diarrhea and is not taking it now   LDL is below 70 and triglycerides are normal recently   Lab Results  Component Value Date   CHOL 118 08/16/2021   CHOL 139 03/04/2021   CHOL 145 12/31/2020   Lab Results  Component Value Date   HDL 43 08/16/2021   HDL 39.90 03/04/2021   HDL 37.30 (L) 12/31/2020   Lab Results  Component Value Date   LDLCALC 55 08/16/2021   LDLCALC 69 03/04/2021   LDLCALC 68 10/30/2020   Lab Results  Component Value Date   TRIG 101 08/16/2021   TRIG 151.0 (H) 03/04/2021   TRIG 298.0 (H) 12/31/2020   Lab Results  Component Value Date   CHOLHDL 2.7 08/16/2021   CHOLHDL 3 03/04/2021   CHOLHDL 4 12/31/2020   Lab Results  Component Value Date   LDLDIRECT 67.0 12/31/2020   LDLDIRECT 91.0 06/11/2020   LDLDIRECT 83.0 10/09/2019            He is being followed by gastroenterology for liver cirrhosis secondary to [facility]  Lab Results  Component Value Date   ALT 28 08/15/2021     He has symptomatic painful neuropathy and is on gabapentin 600 mg twice a day and  Cymbalta 60 mg  Has been followed by neurologist    Review of Systems    Physical Examination:  BP 122/82    Pulse 86    Ht 5' 11"  (1.803 m)    Wt 236 lb 6.4 oz (107.2 kg)    SpO2 96%    BMI 32.97 kg/m       ASSESSMENT:  Diabetes type 2 with obesity  See history of present illness for discussion of current diabetes management, blood sugar patterns and problems identified  Currently on a regimen of AMARYL, Ozempic 1 mg and Metformin 1000 mg twice daily  A1c 6.5  He has tendency to overnight hypoglycemia on a few days from his Amaryl May be doing a little better with his diet generally since blood sugars are improving and  recently averaging only 122 with limited data on his sensor Sensor was accurate when checked with the fingerstick in the office today  LIPIDS: Recently well controlled with normal triglycerides  PLAN: Continue using the freestyle libre sensor Change Amaryl to only half a tablet to be taken before his first meal of the day Reduce metformin to 1 tablet in the evening  He will continue Ozempic 1 mg May consider Mounjaro as a second option  Continue healthy diet avoiding regular soft drinks May not need specific treatment for hypertriglyceridemia at this time we will continue to monitor   Patient Instructions  Take 1/2 Glimeperide before lunch  Take 2 Metformin in am and 1 in pm  Check blood sugars on waking up days a week  Also check blood sugars about 2 hours after meals and do this after different meals by rotation  Recommended blood sugar levels on waking up are 90-130 and about 2 hours after meal is 130-160  Please bring your blood sugar monitor to each visit, thank you      Elayne Snare 09/04/2021, 4:46 PM

## 2021-09-15 ENCOUNTER — Other Ambulatory Visit: Payer: Self-pay

## 2021-09-15 ENCOUNTER — Other Ambulatory Visit: Payer: 59

## 2021-09-15 ENCOUNTER — Telehealth: Payer: Self-pay | Admitting: Internal Medicine

## 2021-09-15 DIAGNOSIS — K7460 Unspecified cirrhosis of liver: Secondary | ICD-10-CM

## 2021-09-15 NOTE — Telephone Encounter (Signed)
Pt needed a new order for abd [place] as the other order had expired. New order placed and message sent to rad scheduling.  Pt knows they should reach out to him to set up the appt. ?

## 2021-09-15 NOTE — Telephone Encounter (Signed)
Patient stated that the orders for his CT scan need to be resent so patient can be seen for his CT and that way he can have his FU after the scan. Please advise.  ?

## 2021-09-17 ENCOUNTER — Ambulatory Visit: Payer: 59 | Admitting: Endocrinology

## 2021-09-18 ENCOUNTER — Other Ambulatory Visit: Payer: Self-pay | Admitting: Internal Medicine

## 2021-09-19 ENCOUNTER — Other Ambulatory Visit (HOSPITAL_COMMUNITY): Payer: Self-pay

## 2021-09-19 ENCOUNTER — Other Ambulatory Visit: Payer: Self-pay

## 2021-09-19 ENCOUNTER — Ambulatory Visit (INDEPENDENT_AMBULATORY_CARE_PROVIDER_SITE_OTHER): Payer: 59 | Admitting: Cardiology

## 2021-09-19 ENCOUNTER — Other Ambulatory Visit (HOSPITAL_BASED_OUTPATIENT_CLINIC_OR_DEPARTMENT_OTHER): Payer: Self-pay

## 2021-09-19 ENCOUNTER — Encounter: Payer: Self-pay | Admitting: Cardiology

## 2021-09-19 VITALS — BP 122/84 | HR 76 | Ht 71.0 in | Wt 244.0 lb

## 2021-09-19 DIAGNOSIS — I25118 Atherosclerotic heart disease of native coronary artery with other forms of angina pectoris: Secondary | ICD-10-CM

## 2021-09-19 DIAGNOSIS — E785 Hyperlipidemia, unspecified: Secondary | ICD-10-CM | POA: Diagnosis not present

## 2021-09-19 DIAGNOSIS — K7581 Nonalcoholic steatohepatitis (NASH): Secondary | ICD-10-CM | POA: Diagnosis not present

## 2021-09-19 DIAGNOSIS — K7460 Unspecified cirrhosis of liver: Secondary | ICD-10-CM | POA: Diagnosis not present

## 2021-09-19 DIAGNOSIS — E782 Mixed hyperlipidemia: Secondary | ICD-10-CM | POA: Diagnosis not present

## 2021-09-19 MED ORDER — ATORVASTATIN CALCIUM 80 MG PO TABS
80.0000 mg | ORAL_TABLET | Freq: Every day | ORAL | 1 refills | Status: DC
  Filled 2021-09-19: qty 90, 90d supply, fill #0
  Filled 2021-12-15: qty 90, 90d supply, fill #1

## 2021-09-19 MED ORDER — DULOXETINE HCL 60 MG PO CPEP
ORAL_CAPSULE | ORAL | 1 refills | Status: DC
  Filled 2021-09-19: qty 180, 90d supply, fill #0
  Filled 2021-12-15: qty 180, 90d supply, fill #1

## 2021-09-19 NOTE — Progress Notes (Signed)
?Cardiology Office Note:   ? ?Date:  09/19/2021  ? ?ID:  James [name], DOB Dec 30, 1955, MRN [id number] ? ?PCP:  Colon Branch, MD  ?Cardiologist:  Jenne Campus, MD   ? ?Referring MD: Colon Branch, MD  ? ?Chief Complaint  ?Patient presents with  ? Follow-up  ?I was in the hospital ? ?History of Present Illness:   ? ?James [name] is a 66 y.o. male with past medical history significant for coronary artery disease status post PTCA of the LAD in 2022, diabetes, essential hypertension, dyslipidemia, nonalcoholic cirrhosis of the liver.  He was admitted recently to the hospital on 17 February because of intermittent chest pain.  Cardiac catheterization has been performed cardiac catheterization showed unchanged lesion compared to prior cardiac catheterization he does have about 75% lesion on the circumflex artery.  He also got multiple residual 30 to 50% lesions. ?Since the time of hospitalization he said he is doing well.  He denies have any chest pain tightness squeezing pressure burning chest he is trying to be more active but admits that he does not do much.  I encouraged him to go outside and exercise on the regular basis. ? ?Past Medical History:  ?Diagnosis Date  ? Abnormal cardiac CT angiography   ? Acid reflux   ? Annual physical exam 04/08/2015  ? Arthritis   ? Atypical chest pain 06/13/2020  ? Blood transfusion without reported diagnosis   ? Body mass index (BMI) 35.0-35.9, adult 04/05/2019  ? Chronic fatigue 01/28/2015  ? Chronic headaches   ? on cymbalta  ? Chronic migraine w/o aura, not intractable, w/o stat migr 10/24/2018  ? Cirrhosis ([place])   ? Colon polyps   ? Complication of anesthesia   ? problems waking up from anesthesia  ? Coronary artery disease 01/23/2019  ? Depression   ? on cymbalta  ? Diabetes mellitus with neuropathy ([place])   ? Diabetes with neuropathy 04/25/2013  ? Diverticulitis 03/2013  ? Dyslipidemia 05/29/2019  ? Eczema   ? Elevated LFTs   ? Epidural lipomatosis 10/05/2018  ? Essential (primary)  hypertension 04/05/2019  ? Essential hypertension 10/10/2019  ? Fatty liver   ? GERD (gastroesophageal reflux disease) 04/28/2011  ? H/O craniotomy 05/07/2015  ? Headache 04/28/2011  ? Hepatitis 10/2017  ? NASH cirrhosis  ? History of kidney stones   ? Hyperlipidemia   ? Hypersomnia with sleep apnea 01/28/2015  ? Hypertension   ? IDA (iron deficiency anemia) 01/24/2019  ? Idiopathic intracranial hypertension 01/14/2017  ? Insomnia 04/26/2013  ? Kidney stone   ? Liver cirrhosis secondary to NASH (nonalcoholic steatohepatitis) ([place]) 01/02/2016  ? Low back pain 04/05/2019  ? Lower back injury 08/14/2019  ? Morbid obesity ([place])   ? Neuromuscular disorder ([place])   ? neuropathy  ? Neuropathy   ? Nonalcoholic steatohepatitis 0/0/7121  ? Obstructive hydrocephalus ([place]) 01/28/2015  ? OSA -- dx ~ 2012, cpap intolerant 09/04/2014  ?  dx ~ 2012, cpap intolerant   ? PCP NOTES >>> 04/08/2015  ? Post-op pain 03/19/2019  ? Post-traumatic hydrocephalus (HCC)   ? s/p shunts x 2 (first got infected )  ? Presence of cerebrospinal fluid drainage device 07/28/2011  ? Psoriasis   ? sees Dr [name]  ? Psoriatic arthritis ([place])   ? REM behavioral disorder 01/14/2017  ? 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  ? see's Dr.Ortmann  ? Severe obesity (BMI >= 40) ([place]) 01/28/2015  ? SI (sacroiliac) joint dysfunction 08/14/2019  ?  Sigmoid diverticulitis 04/25/2013  ? Sleep apnea   ? no CPAP     ? Spondylolisthesis, lumbar region 03/16/2019  ? Stomach ulcer   ? Testosterone deficiency 04/28/2011  ? VP (ventriculoperitoneal) shunt status 07/31/2020  ? ? ?Past Surgical History:  ?Procedure Laterality Date  ? [place]  ? BRAIN SURGERY    ? VP shunts placed in 2007  ? CHOLECYSTECTOMY N/A 08/25/2017  ? Procedure: LAPAROSCOPIC CHOLECYSTECTOMY WITH INTRAOPERATIVE CHOLANGIOGRAM;  Surgeon: [name], MD;  Location: [facility];  Service: General;  Laterality: N/A;  ? COLONOSCOPY    ? CORONARY STENT INTERVENTION N/A 09/27/2020  ? Procedure: CORONARY STENT  INTERVENTION;  Surgeon: Jettie Booze, MD;  Location: [facility];  Service: Cardiovascular;  Laterality: N/A;  ? INTRAVASCULAR ULTRASOUND/IVUS N/A 09/27/2020  ? Procedure: Intravascular Ultrasound/IVUS;  Surgeon: Jettie Booze, MD;  Location: [facility];  Service: Cardiovascular;  Laterality: N/A;  ? JOINT REPLACEMENT    ? total hip  ? LEFT HEART CATH N/A 09/27/2020  ? Procedure: Left Heart Cath;  Surgeon: Jettie Booze, MD;  Location: [facility];  Service: Cardiovascular;  Laterality: N/A;  ? LEFT HEART CATH AND CORONARY ANGIOGRAPHY N/A 09/24/2020  ? Procedure: LEFT HEART CATH AND CORONARY ANGIOGRAPHY;  Surgeon: Jettie Booze, MD;  Location: [facility];  Service: Cardiovascular;  Laterality: N/A;  ? LEFT HEART CATH AND CORONARY ANGIOGRAPHY N/A 08/18/2021  ? Procedure: LEFT HEART CATH AND CORONARY ANGIOGRAPHY;  Surgeon: Troy Sine, MD;  Location: [facility];  Service: Cardiovascular;  Laterality: N/A;  ? LUMBAR FUSION  03/16/2019  ? SHOULDER SURGERY Left 2010  ? TOE SURGERY Left 2018  ? TONSILLECTOMY    ? as a child  ? TOTAL HIP ARTHROPLASTY Left 2011  ? UPPER GASTROINTESTINAL ENDOSCOPY  01/04/2020  ? VENTRICULOPERITONEAL SHUNT  2007  ? x2  ? ? ?Current Medications: ?Current Meds  ?Medication Sig  ? acetaminophen (TYLENOL) 325 MG tablet Take 2 tablets (650 mg total) by mouth every 4 (four) hours as needed for headache or mild pain.  ? Adalimumab (HUMIRA PEN) 40 MG/0.8ML PNKT INJECT 40 MG (0.8 ML) UNDER THE SKIN EVERY OTHER WEEK. (Patient taking differently: Inject 40 mg into the skin every 14 (fourteen) days.)  ? amLODipine (NORVASC) 5 MG tablet Take 1 tablet (5 mg total) by mouth daily.  ? aspirin 81 MG chewable tablet Chew 1 tablet (81 mg total) by mouth daily.  ? atorvastatin (LIPITOR) 80 MG tablet Take 1 tablet (80 mg total) by mouth at bedtime.  ? carvedilol (COREG) 12.5 MG tablet TAKE 1 TABLET (12.5 MG TOTAL) BY MOUTH 2 (TWO) TIMES DAILY WITH A  MEAL. (Patient taking differently: Take 12.5 mg by mouth 2 (two) times daily with a meal.)  ? clonazePAM (KLONOPIN) 0.5 MG tablet TAKE 1 TABLET BY MOUTH EVERY NIGHT AT BEDTIME (Patient taking differently: Take 0.5 mg by mouth at bedtime.)  ? clopidogrel (PLAVIX) 75 MG tablet TAKE 1 TABLET (75 MG TOTAL) BY MOUTH DAILY.  ? Continuous Blood Gluc Sensor (FREESTYLE LIBRE 14 DAY SENSOR) MISC Apply 1 sensor every 14 (fourteen) days.  ? DULoxetine (CYMBALTA) 60 MG capsule TAKE 2 CAPSULES (120 MG TOTAL) BY MOUTH DAILY. (Patient taking differently: Take 60 mg by mouth daily.)  ? EPINEPHrine (EPIPEN 2-PAK) 0.3 mg/0.3 mL IJ SOAJ injection Inject 0.3 mLs (0.3 mg total) into the muscle once as needed for up to 1 dose for anaphylaxis.  ?  fenofibrate micronized (LOFIBRA) 134 MG capsule TAKE 1 CAPSULE BY MOUTH THREE TIMES WEEKLY (Patient taking differently: Take 134 mg by mouth daily before breakfast.)  ? gabapentin (NEURONTIN) 600 MG tablet TAKE 1 TABLET (600 MG TOTAL) BY MOUTH 2 (TWO) TIMES DAILY. (Patient taking differently: Take 600 mg by mouth 2 (two) times daily.)  ? glimepiride (AMARYL) 4 MG tablet TAKE 1 TABLET BY MOUTH ONCE DAILY BEFORE BREAKFAST (Patient taking differently: Take 4 mg by mouth daily with breakfast.)  ? isosorbide mononitrate (IMDUR) 60 MG 24 hr tablet Take 1 tablet (60 mg total) by mouth daily.  ? metFORMIN (GLUCOPHAGE) 500 MG tablet Take 2 tablets (1,000 mg total) by mouth 2 (two) times daily.  ? nitroGLYCERIN (NITROSTAT) 0.4 MG SL tablet Place 1 tablet (0.4 mg total) under the tongue every 5 (five) minutes for 3 doses as needed for chest pain.  ? pantoprazole (PROTONIX) 40 MG tablet Take 1 tablet (40 mg total) by mouth daily.  ? Semaglutide, 2 MG/DOSE, (OZEMPIC, 2 MG/DOSE,) 8 MG/3ML SOPN Inject 2 mg into the skin weekly (Patient taking differently: Inject 2 mg into the skin once a week. Inject 2 mg into the skin weekly)  ?  ? ?Allergies:   Bee venom, Hydrocodone bit-homatrop mbr, Toradol [ketorolac  tromethamine], Sulfadiazine, Morphine and related, and Sulfa drugs cross reactors  ? ?Social History  ? ?Socioeconomic History  ? Marital status: Married  ?  Spouse name: [name]  ? Number of children: 2  ? Years o

## 2021-09-19 NOTE — Patient Instructions (Signed)
Medication Instructions:  Your physician recommends that you continue on your current medications as directed. Please refer to the Current Medication list given to you today.  *If you need a refill on your cardiac medications before your next appointment, please call your pharmacy*   Lab Work: None. If you have labs (blood work) drawn today and your tests are completely normal, you will receive your results only by: . MyChart Message (if you have MyChart) OR . A paper copy in the mail If you have any lab test that is abnormal or we need to change your treatment, we will call you to review the results.   Testing/Procedures: None.   Follow-Up: At [facility], you and your health needs are our priority.  As part of our continuing mission to provide you with exceptional heart care, we have created designated Provider Care Teams.  These Care Teams include your primary Cardiologist (physician) and Advanced Practice Providers (APPs -  Physician Assistants and Nurse Practitioners) who all work together to provide you with the care you need, when you need it.  We recommend signing up for the patient portal called "MyChart".  Sign up information is provided on this After Visit Summary.  MyChart is used to connect with patients for Virtual Visits (Telemedicine).  Patients are able to view lab/test results, encounter notes, upcoming appointments, etc.  Non-urgent messages can be sent to your provider as well.   To learn more about what you can do with MyChart, go to [email or web address].    Your next appointment:   3 month(s)  The format for your next appointment:   In Person  Provider:   Robert Krasowski, MD   Other Instructions   

## 2021-09-24 ENCOUNTER — Ambulatory Visit (HOSPITAL_COMMUNITY): Payer: 59

## 2021-09-29 ENCOUNTER — Other Ambulatory Visit: Payer: Self-pay | Admitting: Endocrinology

## 2021-09-29 ENCOUNTER — Other Ambulatory Visit (HOSPITAL_COMMUNITY): Payer: Self-pay

## 2021-09-29 DIAGNOSIS — E1165 Type 2 diabetes mellitus with hyperglycemia: Secondary | ICD-10-CM

## 2021-09-30 ENCOUNTER — Other Ambulatory Visit (HOSPITAL_COMMUNITY): Payer: Self-pay

## 2021-09-30 ENCOUNTER — Ambulatory Visit (INDEPENDENT_AMBULATORY_CARE_PROVIDER_SITE_OTHER): Payer: 59 | Admitting: Internal Medicine

## 2021-09-30 ENCOUNTER — Encounter: Payer: Self-pay | Admitting: Internal Medicine

## 2021-09-30 ENCOUNTER — Other Ambulatory Visit (HOSPITAL_BASED_OUTPATIENT_CLINIC_OR_DEPARTMENT_OTHER): Payer: Self-pay

## 2021-09-30 VITALS — BP 120/76 | HR 72 | Temp 97.7°F | Resp 18 | Ht 71.0 in | Wt 247.5 lb

## 2021-09-30 DIAGNOSIS — I200 Unstable angina: Secondary | ICD-10-CM

## 2021-09-30 DIAGNOSIS — S8992XA Unspecified injury of left lower leg, initial encounter: Secondary | ICD-10-CM | POA: Diagnosis not present

## 2021-09-30 DIAGNOSIS — Z23 Encounter for immunization: Secondary | ICD-10-CM

## 2021-09-30 MED ORDER — CEPHALEXIN 500 MG PO CAPS
500.0000 mg | ORAL_CAPSULE | Freq: Four times a day (QID) | ORAL | 0 refills | Status: DC
  Filled 2021-09-30: qty 28, 7d supply, fill #0

## 2021-09-30 MED ORDER — FREESTYLE LIBRE 14 DAY SENSOR MISC
1.0000 [IU] | 0 refills | Status: DC
  Filled 2021-09-30: qty 2, 28d supply, fill #0

## 2021-09-30 NOTE — Progress Notes (Signed)
? ?Subjective:  ? ? Patient ID: James [name], male    DOB: Mar 28, 1956, 66 y.o.   MRN: [id number] ? ?DOS:  09/30/2021 ?Type of visit - description: Acute ? ?Injury happened 09/20/2021, he hit the left pretibial area, he immediately developed bleeding and pain. ?He was able to stop the bleeding and clean at the area really well. ?The next day he went to [place] and come back few days ago. ?He is here because has developed a hematoma, although it looks better he and his wife are still worried. ? ?Denies fever chills ?Admits that the area has been slightly warm. ?No openings but has seen some clear liquid oozing. ?Has some pain. ? ?Review of Systems ?See above  ? ?Past Medical History:  ?Diagnosis Date  ? Abnormal cardiac CT angiography   ? Acid reflux   ? Annual physical exam 04/08/2015  ? Arthritis   ? Atypical chest pain 06/13/2020  ? Blood transfusion without reported diagnosis   ? Body mass index (BMI) 35.0-35.9, adult 04/05/2019  ? Chronic fatigue 01/28/2015  ? Chronic headaches   ? on cymbalta  ? Chronic migraine w/o aura, not intractable, w/o stat migr 10/24/2018  ? Cirrhosis ([place])   ? Colon polyps   ? Complication of anesthesia   ? problems waking up from anesthesia  ? Coronary artery disease 01/23/2019  ? Depression   ? on cymbalta  ? Diabetes mellitus with neuropathy ([place])   ? Diabetes with neuropathy 04/25/2013  ? Diverticulitis 03/2013  ? Dyslipidemia 05/29/2019  ? Eczema   ? Elevated LFTs   ? Epidural lipomatosis 10/05/2018  ? Essential (primary) hypertension 04/05/2019  ? Essential hypertension 10/10/2019  ? Fatty liver   ? GERD (gastroesophageal reflux disease) 04/28/2011  ? H/O craniotomy 05/07/2015  ? Headache 04/28/2011  ? Hepatitis 10/2017  ? NASH cirrhosis  ? History of kidney stones   ? Hyperlipidemia   ? Hypersomnia with sleep apnea 01/28/2015  ? Hypertension   ? IDA (iron deficiency anemia) 01/24/2019  ? Idiopathic intracranial hypertension 01/14/2017  ? Insomnia 04/26/2013  ? Kidney stone   ? Liver cirrhosis  secondary to NASH (nonalcoholic steatohepatitis) ([place]) 01/02/2016  ? Low back pain 04/05/2019  ? Lower back injury 08/14/2019  ? Morbid obesity ([place])   ? Neuromuscular disorder ([place])   ? neuropathy  ? Neuropathy   ? Nonalcoholic steatohepatitis 09/29/3534  ? Obstructive hydrocephalus ([place]) 01/28/2015  ? OSA -- dx ~ 2012, cpap intolerant 09/04/2014  ?  dx ~ 2012, cpap intolerant   ? PCP NOTES >>> 04/08/2015  ? Post-op pain 03/19/2019  ? Post-traumatic hydrocephalus (HCC)   ? s/p shunts x 2 (first got infected )  ? Presence of cerebrospinal fluid drainage device 07/28/2011  ? Psoriasis   ? sees Dr [name]  ? Psoriatic arthritis ([place])   ? REM behavioral disorder 01/14/2017  ? 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  ? see's Dr.Ortmann  ? Severe obesity (BMI >= 40) ([place]) 01/28/2015  ? SI (sacroiliac) joint dysfunction 08/14/2019  ? Sigmoid diverticulitis 04/25/2013  ? Sleep apnea   ? no CPAP     ? Spondylolisthesis, lumbar region 03/16/2019  ? Stomach ulcer   ? Testosterone deficiency 04/28/2011  ? VP (ventriculoperitoneal) shunt status 07/31/2020  ? ? ?Past Surgical History:  ?Procedure Laterality Date  ? [place]  ? BRAIN SURGERY    ? VP shunts placed in 2007  ? CHOLECYSTECTOMY N/A 08/25/2017  ? Procedure: LAPAROSCOPIC CHOLECYSTECTOMY WITH INTRAOPERATIVE  CHOLANGIOGRAM;  Surgeon: [name], MD;  Location: [facility];  Service: General;  Laterality: N/A;  ? COLONOSCOPY    ? CORONARY STENT INTERVENTION N/A 09/27/2020  ? Procedure: CORONARY STENT INTERVENTION;  Surgeon: Jettie Booze, MD;  Location: [facility];  Service: Cardiovascular;  Laterality: N/A;  ? INTRAVASCULAR ULTRASOUND/IVUS N/A 09/27/2020  ? Procedure: Intravascular Ultrasound/IVUS;  Surgeon: Jettie Booze, MD;  Location: [facility];  Service: Cardiovascular;  Laterality: N/A;  ? JOINT REPLACEMENT    ? total hip  ? LEFT HEART CATH N/A 09/27/2020  ? Procedure: Left Heart Cath;  Surgeon: Jettie Booze, MD;  Location: [facility];  Service: Cardiovascular;  Laterality: N/A;  ? LEFT HEART CATH AND CORONARY ANGIOGRAPHY N/A 09/24/2020  ? Procedure: LEFT HEART CATH AND CORONARY ANGIOGRAPHY;  Surgeon: Jettie Booze, MD;  Location: [facility];  Service: Cardiovascular;  Laterality: N/A;  ? LEFT HEART CATH AND CORONARY ANGIOGRAPHY N/A 08/18/2021  ? Procedure: LEFT HEART CATH AND CORONARY ANGIOGRAPHY;  Surgeon: Troy Sine, MD;  Location: [facility];  Service: Cardiovascular;  Laterality: N/A;  ? LUMBAR FUSION  03/16/2019  ? SHOULDER SURGERY Left 2010  ? TOE SURGERY Left 2018  ? TONSILLECTOMY    ? as a child  ? TOTAL HIP ARTHROPLASTY Left 2011  ? UPPER GASTROINTESTINAL ENDOSCOPY  01/04/2020  ? VENTRICULOPERITONEAL SHUNT  2007  ? x2  ? ? ?Current Outpatient Medications  ?Medication Instructions  ? acetaminophen (TYLENOL) 650 mg, Oral, Every 4 hours PRN  ? Adalimumab (HUMIRA PEN) 40 MG/0.8ML PNKT INJECT 40 MG (0.8 ML) UNDER THE SKIN EVERY OTHER WEEK.  ? amLODipine (NORVASC) 5 mg, Oral, Daily  ? aspirin 81 mg, Oral, Daily  ? atorvastatin (LIPITOR) 80 mg, Oral, Daily at bedtime  ? carvedilol (COREG) 12.5 MG tablet TAKE 1 TABLET (12.5 MG TOTAL) BY MOUTH 2 (TWO) TIMES DAILY WITH A MEAL.  ? cephALEXin (KEFLEX) 500 mg, Oral, 4 times daily  ? clonazePAM (KLONOPIN) 0.5 MG tablet TAKE 1 TABLET BY MOUTH EVERY NIGHT AT BEDTIME  ? clopidogrel (PLAVIX) 75 MG tablet TAKE 1 TABLET (75 MG TOTAL) BY MOUTH DAILY.  ? Continuous Blood Gluc Sensor (FREESTYLE LIBRE 14 DAY SENSOR) MISC Apply 1 sensor every 14 (fourteen) days.  ? DULoxetine (CYMBALTA) 60 MG capsule TAKE 2 CAPSULES (120 MG TOTAL) BY MOUTH DAILY.  ? EPINEPHrine (EPIPEN 2-PAK) 0.3 mg, Intramuscular, Once PRN  ? fenofibrate micronized (LOFIBRA) 134 MG capsule TAKE 1 CAPSULE BY MOUTH THREE TIMES WEEKLY  ? gabapentin (NEURONTIN) 600 MG tablet TAKE 1 TABLET (600 MG TOTAL) BY MOUTH 2 (TWO) TIMES DAILY.  ? glimepiride (AMARYL) 4 MG tablet TAKE 1 TABLET BY MOUTH ONCE DAILY  BEFORE BREAKFAST  ? isosorbide mononitrate (IMDUR) 60 mg, Oral, Daily  ? metFORMIN (GLUCOPHAGE) 1,000 mg, Oral, 2 times daily  ? nitroGLYCERIN (NITROSTAT) 0.4 MG SL tablet Place 1 tablet (0.4 mg total) under the tongue every 5 (five) minutes for 3 doses as needed for chest pain.  ? pantoprazole (PROTONIX) 40 mg, Oral, Daily  ? Semaglutide, 2 MG/DOSE, (OZEMPIC, 2 MG/DOSE,) 8 MG/3ML SOPN Inject 2 mg into the skin weekly  ? ? ?   ?Objective:  ? Physical Exam ?BP 120/76 (BP Location: Left Arm, Patient Position: Sitting, Cuff Size: Normal)   Pulse 72   Temp 97.7 ?F (36.5 ?C) (Oral)   Resp 18   Ht 5' 11"  (1.803 m)   Wt 247 lb 8  oz (112.3 kg)   SpO2 96%   BMI 34.52 kg/m?  ?General:   ?Well developed, NAD, BMI noted. ?HEENT:  ?Normocephalic . Face symmetric, atraumatic ?Lower extremities: ?Right leg: Skin changes consistent with psoriasis ?L leg: Skin changes consistent with psoriasis, has a soft, slightly fluctuant hematoma at the pretibial area, area is a slightly warm and tender to touch, not particularly red.  No openings, no discharge noted.  See picture. ?L calf is soft, not TTP ?Skin: Not pale. Not jaundice ?Neurologic:  ?alert & oriented X3.  ?Speech normal, gait appropriate for age and unassisted ?Psych--  ?Cognition and judgment appear intact.  ?Cooperative with normal attention span and concentration.  ?Behavior appropriate. ?No anxious or depressed appearing.  ? ? ?   ?Assessment   ? ? Assessment  ?DM - Dr [name] ?Neuropathy (x years, rx gaba 05-2014, w/u 11-2014  RPR neg, vit D-B12-Folic Acid wnl ); Saw Dr Posey Pronto, [facility] [phone] (see results) ?HTN: History of AKI with ARBs  ?CRI, sees nephrology, etiology felt to be hHyperglycemia, intermittent NSAIDs, contrast exposures ?Hyperlipidemia (TG in the 500s 2016) ?OSA , dx 2012, sleep study again 02-2015 Dr Dohmeier--> severe OSA, intolerant to CPAP ?Depression, insomnia: on Cymbalta ?NEURO: ?--Chronic headaches :on Cymbalta  ?--Posttraumatic hydrocephalus s/p 2  shunts (first got infected) ?MSK: on disability d/t back pain- HAs ?GI:  ?--GERD, diverticulitis 2014, h/o PUD ?-- NASH with cirrhosis per GI note 10/2017, s/p Hep A/B shots ?--Anemia: ?- felt to be d/t   GAVE (gastric antra

## 2021-09-30 NOTE — Assessment & Plan Note (Signed)
Hematoma, left leg: ?Symptoms and findings as described above, likely he has a hematoma, there is a slightly warm, early infection?Marland Kitchen ?Plan: Cold compress, empiric Keflex, watch area closely, call if fever/chills/warmness.  See AVS.  Patient verbalized understanding ?Tdap  today. ?CAD ?Saw cardiology 09/19/2021, rec to cont medical mngmt for CAD. ?   ?

## 2021-09-30 NOTE — Patient Instructions (Signed)
Take antibiotics as prescribed for 1 week. ? ?Place a cold compress twice a day. ? ? ?Call if not gradually better ? ?Call anytime if severe pain, fever, chills.  Also if the area gets really hot or red. ? ?

## 2021-10-08 ENCOUNTER — Ambulatory Visit: Payer: 59 | Admitting: Internal Medicine

## 2021-10-13 ENCOUNTER — Encounter: Payer: Self-pay | Admitting: Internal Medicine

## 2021-10-13 ENCOUNTER — Ambulatory Visit (INDEPENDENT_AMBULATORY_CARE_PROVIDER_SITE_OTHER): Payer: 59 | Admitting: Internal Medicine

## 2021-10-13 VITALS — BP 124/72 | HR 70 | Temp 97.8°F | Resp 18 | Ht 71.0 in | Wt 245.1 lb

## 2021-10-13 DIAGNOSIS — S8012XD Contusion of left lower leg, subsequent encounter: Secondary | ICD-10-CM

## 2021-10-13 DIAGNOSIS — R222 Localized swelling, mass and lump, trunk: Secondary | ICD-10-CM

## 2021-10-13 DIAGNOSIS — I200 Unstable angina: Secondary | ICD-10-CM

## 2021-10-13 NOTE — Progress Notes (Signed)
? ?Subjective:  ? ? Patient ID: James [name], male    DOB: 1955/10/26, 66 y.o.   MRN: [id number] ? ?DOS:  10/13/2021 ?Type of visit - description: acute ? ?See LOV, he injuried his left leg and developed a hematoma. ?Is here because like something to be done about it. ?Overall the swelling decreased at first and now is looking about the same for the last couple of weeks. ?Denies fever chills ?No discharge or bleeding. ?Mildly painful but states "it bothers me". ? ?Also has a "knot" at the low back, is also bothering him. ? ?Review of Systems ?See above  ? ?Past Medical History:  ?Diagnosis Date  ? Abnormal cardiac CT angiography   ? Acid reflux   ? Annual physical exam 04/08/2015  ? Arthritis   ? Atypical chest pain 06/13/2020  ? Blood transfusion without reported diagnosis   ? Body mass index (BMI) 35.0-35.9, adult 04/05/2019  ? Chronic fatigue 01/28/2015  ? Chronic headaches   ? on cymbalta  ? Chronic migraine w/o aura, not intractable, w/o stat migr 10/24/2018  ? Cirrhosis ([place])   ? Colon polyps   ? Complication of anesthesia   ? problems waking up from anesthesia  ? Coronary artery disease 01/23/2019  ? Depression   ? on cymbalta  ? Diabetes mellitus with neuropathy ([place])   ? Diabetes with neuropathy 04/25/2013  ? Diverticulitis 03/2013  ? Dyslipidemia 05/29/2019  ? Eczema   ? Elevated LFTs   ? Epidural lipomatosis 10/05/2018  ? Essential (primary) hypertension 04/05/2019  ? Essential hypertension 10/10/2019  ? Fatty liver   ? GERD (gastroesophageal reflux disease) 04/28/2011  ? H/O craniotomy 05/07/2015  ? Headache 04/28/2011  ? Hepatitis 10/2017  ? NASH cirrhosis  ? History of kidney stones   ? Hyperlipidemia   ? Hypersomnia with sleep apnea 01/28/2015  ? Hypertension   ? IDA (iron deficiency anemia) 01/24/2019  ? Idiopathic intracranial hypertension 01/14/2017  ? Insomnia 04/26/2013  ? Kidney stone   ? Liver cirrhosis secondary to NASH (nonalcoholic steatohepatitis) ([place]) 01/02/2016  ? Low back pain 04/05/2019  ? Lower back  injury 08/14/2019  ? Morbid obesity ([place])   ? Neuromuscular disorder ([place])   ? neuropathy  ? Neuropathy   ? Nonalcoholic steatohepatitis 11/28/9507  ? Obstructive hydrocephalus ([place]) 01/28/2015  ? OSA -- dx ~ 2012, cpap intolerant 09/04/2014  ?  dx ~ 2012, cpap intolerant   ? PCP NOTES >>> 04/08/2015  ? Post-op pain 03/19/2019  ? Post-traumatic hydrocephalus (HCC)   ? s/p shunts x 2 (first got infected )  ? Presence of cerebrospinal fluid drainage device 07/28/2011  ? Psoriasis   ? sees Dr [name]  ? Psoriatic arthritis ([place])   ? REM behavioral disorder 01/14/2017  ? 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  ? see's Dr.Ortmann  ? Severe obesity (BMI >= 40) ([place]) 01/28/2015  ? SI (sacroiliac) joint dysfunction 08/14/2019  ? Sigmoid diverticulitis 04/25/2013  ? Sleep apnea   ? no CPAP     ? Spondylolisthesis, lumbar region 03/16/2019  ? Stomach ulcer   ? Testosterone deficiency 04/28/2011  ? VP (ventriculoperitoneal) shunt status 07/31/2020  ? ? ?Past Surgical History:  ?Procedure Laterality Date  ? [place]  ? BRAIN SURGERY    ? VP shunts placed in 2007  ? CHOLECYSTECTOMY N/A 08/25/2017  ? Procedure: LAPAROSCOPIC CHOLECYSTECTOMY WITH INTRAOPERATIVE CHOLANGIOGRAM;  Surgeon: [name], MD;  Location: [facility];  Service: General;  Laterality: N/A;  ?  COLONOSCOPY    ? CORONARY STENT INTERVENTION N/A 09/27/2020  ? Procedure: CORONARY STENT INTERVENTION;  Surgeon: Jettie Booze, MD;  Location: [facility];  Service: Cardiovascular;  Laterality: N/A;  ? INTRAVASCULAR ULTRASOUND/IVUS N/A 09/27/2020  ? Procedure: Intravascular Ultrasound/IVUS;  Surgeon: Jettie Booze, MD;  Location: [facility];  Service: Cardiovascular;  Laterality: N/A;  ? JOINT REPLACEMENT    ? total hip  ? LEFT HEART CATH N/A 09/27/2020  ? Procedure: Left Heart Cath;  Surgeon: Jettie Booze, MD;  Location: [facility];  Service: Cardiovascular;  Laterality: N/A;  ? LEFT HEART CATH AND CORONARY ANGIOGRAPHY N/A  09/24/2020  ? Procedure: LEFT HEART CATH AND CORONARY ANGIOGRAPHY;  Surgeon: Jettie Booze, MD;  Location: [facility];  Service: Cardiovascular;  Laterality: N/A;  ? LEFT HEART CATH AND CORONARY ANGIOGRAPHY N/A 08/18/2021  ? Procedure: LEFT HEART CATH AND CORONARY ANGIOGRAPHY;  Surgeon: Troy Sine, MD;  Location: [facility];  Service: Cardiovascular;  Laterality: N/A;  ? LUMBAR FUSION  03/16/2019  ? SHOULDER SURGERY Left 2010  ? TOE SURGERY Left 2018  ? TONSILLECTOMY    ? as a child  ? TOTAL HIP ARTHROPLASTY Left 2011  ? UPPER GASTROINTESTINAL ENDOSCOPY  01/04/2020  ? VENTRICULOPERITONEAL SHUNT  2007  ? x2  ? ? ?Current Outpatient Medications  ?Medication Instructions  ? acetaminophen (TYLENOL) 650 mg, Oral, Every 4 hours PRN  ? Adalimumab (HUMIRA PEN) 40 MG/0.8ML PNKT INJECT 40 MG (0.8 ML) UNDER THE SKIN EVERY OTHER WEEK.  ? amLODipine (NORVASC) 5 mg, Oral, Daily  ? aspirin 81 mg, Oral, Daily  ? atorvastatin (LIPITOR) 80 mg, Oral, Daily at bedtime  ? carvedilol (COREG) 12.5 MG tablet TAKE 1 TABLET (12.5 MG TOTAL) BY MOUTH 2 (TWO) TIMES DAILY WITH A MEAL.  ? clonazePAM (KLONOPIN) 0.5 MG tablet TAKE 1 TABLET BY MOUTH EVERY NIGHT AT BEDTIME  ? clopidogrel (PLAVIX) 75 MG tablet TAKE 1 TABLET (75 MG TOTAL) BY MOUTH DAILY.  ? Continuous Blood Gluc Sensor (FREESTYLE LIBRE 14 DAY SENSOR) MISC Apply 1 sensor every 14 (fourteen) days.  ? DULoxetine (CYMBALTA) 60 MG capsule TAKE 2 CAPSULES (120 MG TOTAL) BY MOUTH DAILY.  ? EPINEPHrine (EPIPEN 2-PAK) 0.3 mg, Intramuscular, Once PRN  ? fenofibrate micronized (LOFIBRA) 134 MG capsule TAKE 1 CAPSULE BY MOUTH THREE TIMES WEEKLY  ? gabapentin (NEURONTIN) 600 MG tablet TAKE 1 TABLET (600 MG TOTAL) BY MOUTH 2 (TWO) TIMES DAILY.  ? glimepiride (AMARYL) 4 MG tablet TAKE 1 TABLET BY MOUTH ONCE DAILY BEFORE BREAKFAST  ? isosorbide mononitrate (IMDUR) 60 mg, Oral, Daily  ? metFORMIN (GLUCOPHAGE) 1,000 mg, Oral, 2 times daily  ? nitroGLYCERIN (NITROSTAT) 0.4 MG SL  tablet Place 1 tablet (0.4 mg total) under the tongue every 5 (five) minutes for 3 doses as needed for chest pain.  ? pantoprazole (PROTONIX) 40 mg, Oral, Daily  ? Semaglutide, 2 MG/DOSE, (OZEMPIC, 2 MG/DOSE,) 8 MG/3ML SOPN Inject 2 mg into the skin weekly  ? ? ?   ?Objective:  ? Physical Exam ?BP 124/72 (BP Location: Left Arm, Patient Position: Sitting, Cuff Size: Normal)   Pulse 70   Temp 97.8 ?F (36.6 ?C) (Oral)   Resp 18   Ht 5' 11"  (1.803 m)   Wt 245 lb 2 oz (111.2 kg)   SpO2 97%   BMI 34.19 kg/m?  ?General:   ?Well developed, NAD, BMI noted. ?HEENT:  ?Normocephalic . Face symmetric, atraumatic ?Lower  extremities: Continue to have a hematoma at the left pretibial area, he removed a scab today, no discharge noted.  Area is fluctuant and slightly tender.  Not warm. ?Subcutaneous tissue: At the distal left low back he has a subcu nodule approximately 3 x 1 cm in size, mobile. ?Skin: Not pale. Not jaundice ?Neurologic:  ?alert & oriented X3.  ?Speech normal, gait appropriate for age and unassisted ?Psych--  ?Cognition and judgment appear intact.  ?Cooperative with normal attention span and concentration.  ?Behavior appropriate. ?No anxious or depressed appearing.  ? ? ?   ?Assessment   ? ? ? Assessment  ?DM - Dr [name] ?Neuropathy (x years, rx gaba 05-2014, w/u 11-2014  RPR neg, vit D-B12-Folic Acid wnl ); Saw Dr Posey Pronto, NCS [phone] (see results) ?HTN: History of AKI with ARBs  ?CRI, sees nephrology, etiology felt to be hHyperglycemia, intermittent NSAIDs, contrast exposures ?Hyperlipidemia (TG in the 500s 2016) ?OSA , dx 2012, sleep study again 02-2015 Dr Dohmeier--> severe OSA, intolerant to CPAP ?Depression, insomnia: on Cymbalta ?NEURO: ?--Chronic headaches :on Cymbalta  ?--Posttraumatic hydrocephalus s/p 2 shunts (first got infected) ?MSK: on disability d/t back pain- HAs ?GI:  ?--GERD, diverticulitis 2014, h/o PUD ?-- NASH with cirrhosis per GI note 10/2017, s/p Hep A/B shots ?--Anemia: ?- felt to be d/t    GAVE (gastric antral vascular ectasia) and a inflammatory polyp, s/p  EGD 10/2018    ?-Work-up repeated 10/2019: ?EGD: GAVE  versus portal hypertensive gastropathy. ?Colonoscopy polyps.  Tubular adenoma. ?Gastric

## 2021-10-14 ENCOUNTER — Ambulatory Visit (HOSPITAL_COMMUNITY)
Admission: RE | Admit: 2021-10-14 | Discharge: 2021-10-14 | Disposition: A | Discharge status: Home / Self Care | Payer: 59 | Source: Ambulatory Visit | Attending: Internal Medicine | Admitting: Internal Medicine

## 2021-10-14 DIAGNOSIS — R161 Splenomegaly, not elsewhere classified: Secondary | ICD-10-CM | POA: Diagnosis not present

## 2021-10-14 DIAGNOSIS — K7581 Nonalcoholic steatohepatitis (NASH): Secondary | ICD-10-CM | POA: Diagnosis not present

## 2021-10-14 DIAGNOSIS — K7460 Unspecified cirrhosis of liver: Secondary | ICD-10-CM | POA: Diagnosis not present

## 2021-10-14 DIAGNOSIS — K7689 Other specified diseases of liver: Secondary | ICD-10-CM | POA: Diagnosis not present

## 2021-10-14 NOTE — Assessment & Plan Note (Signed)
Hematoma, left leg: ?Area today does not look infected, the patient   requests something to be done about it.  I do not know if the hematoma would be drainable but we will get the opinion of a plastic surgeon. ?Otherwise recommend observation. ?Subcutaneous nodule: On the left back, he is also concerned about it, referring to a plastic surgeon ? ?  ?

## 2021-10-16 ENCOUNTER — Telehealth: Payer: Self-pay | Admitting: Internal Medicine

## 2021-10-16 NOTE — Telephone Encounter (Signed)
Patient is calling you back. ?

## 2021-10-16 NOTE — Telephone Encounter (Signed)
See result note.  

## 2021-10-21 ENCOUNTER — Other Ambulatory Visit: Payer: Self-pay | Admitting: Endocrinology

## 2021-10-21 ENCOUNTER — Other Ambulatory Visit (HOSPITAL_COMMUNITY): Payer: Self-pay

## 2021-10-21 ENCOUNTER — Encounter: Payer: Self-pay | Admitting: Neurology

## 2021-10-21 ENCOUNTER — Ambulatory Visit: Payer: 59 | Admitting: Neurology

## 2021-10-21 DIAGNOSIS — E1165 Type 2 diabetes mellitus with hyperglycemia: Secondary | ICD-10-CM

## 2021-10-22 ENCOUNTER — Other Ambulatory Visit (HOSPITAL_COMMUNITY): Payer: Self-pay

## 2021-10-22 ENCOUNTER — Ambulatory Visit: Payer: 59 | Admitting: Internal Medicine

## 2021-10-22 ENCOUNTER — Other Ambulatory Visit (HOSPITAL_BASED_OUTPATIENT_CLINIC_OR_DEPARTMENT_OTHER): Payer: Self-pay

## 2021-10-22 MED ORDER — FENOFIBRATE MICRONIZED 134 MG PO CAPS
ORAL_CAPSULE | ORAL | 3 refills | Status: DC
Start: 2021-10-22 — End: 2022-05-26
  Filled 2021-10-22: qty 15, 35d supply, fill #0
  Filled 2021-12-15: qty 15, 35d supply, fill #1
  Filled 2022-02-26: qty 15, 35d supply, fill #2
  Filled 2022-04-06: qty 15, 35d supply, fill #3

## 2021-10-22 MED ORDER — FREESTYLE LIBRE 14 DAY SENSOR MISC
1.0000 [IU] | 0 refills | Status: DC
  Filled 2021-10-22: qty 2, 28d supply, fill #0

## 2021-10-23 ENCOUNTER — Other Ambulatory Visit (HOSPITAL_BASED_OUTPATIENT_CLINIC_OR_DEPARTMENT_OTHER): Payer: Self-pay

## 2021-10-23 NOTE — Telephone Encounter (Signed)
Patient advised ? ?Patient would like to keep appointment cancelled due to recent referral  ?

## 2021-10-24 ENCOUNTER — Ambulatory Visit: Payer: 59 | Admitting: Internal Medicine

## 2021-10-24 ENCOUNTER — Other Ambulatory Visit (HOSPITAL_BASED_OUTPATIENT_CLINIC_OR_DEPARTMENT_OTHER): Payer: Self-pay

## 2021-10-24 ENCOUNTER — Ambulatory Visit (HOSPITAL_BASED_OUTPATIENT_CLINIC_OR_DEPARTMENT_OTHER)
Admission: RE | Admit: 2021-10-24 | Discharge: 2021-10-24 | Disposition: A | Discharge status: Home / Self Care | Payer: 59 | Source: Ambulatory Visit | Attending: Internal Medicine | Admitting: Internal Medicine

## 2021-10-24 ENCOUNTER — Encounter: Payer: Self-pay | Admitting: Internal Medicine

## 2021-10-24 ENCOUNTER — Ambulatory Visit (INDEPENDENT_AMBULATORY_CARE_PROVIDER_SITE_OTHER): Payer: 59 | Admitting: Internal Medicine

## 2021-10-24 ENCOUNTER — Telehealth: Payer: Self-pay

## 2021-10-24 ENCOUNTER — Encounter (HOSPITAL_BASED_OUTPATIENT_CLINIC_OR_DEPARTMENT_OTHER): Payer: Self-pay

## 2021-10-24 VITALS — BP 136/84 | HR 81 | Temp 98.5°F | Resp 18 | Ht 71.0 in | Wt 246.2 lb

## 2021-10-24 DIAGNOSIS — K5792 Diverticulitis of intestine, part unspecified, without perforation or abscess without bleeding: Secondary | ICD-10-CM | POA: Diagnosis not present

## 2021-10-24 DIAGNOSIS — N200 Calculus of kidney: Secondary | ICD-10-CM | POA: Diagnosis not present

## 2021-10-24 DIAGNOSIS — R1032 Left lower quadrant pain: Secondary | ICD-10-CM | POA: Diagnosis not present

## 2021-10-24 DIAGNOSIS — I200 Unstable angina: Secondary | ICD-10-CM

## 2021-10-24 LAB — URINALYSIS, ROUTINE W REFLEX MICROSCOPIC
Bilirubin Urine: NEGATIVE
Hgb urine dipstick: NEGATIVE
Ketones, ur: NEGATIVE
Leukocytes,Ua: NEGATIVE
Nitrite: NEGATIVE
Specific Gravity, Urine: >=1.03 — AB (ref 1.000–1.030)
Urine Glucose: NEGATIVE
Urobilinogen, UA: 0.2 (ref 0.0–1.0)
pH: 5.5 (ref 5.0–8.0)

## 2021-10-24 LAB — CBC WITH DIFFERENTIAL/PLATELET
Basophils Absolute: 0 10*3/uL (ref 0.0–0.1)
Basophils Relative: 0.4 % (ref 0.0–3.0)
Eosinophils Absolute: 0.2 10*3/uL (ref 0.0–0.7)
Eosinophils Relative: 3.1 % (ref 0.0–5.0)
HCT: 39.9 % (ref 39.0–52.0)
Hemoglobin: 13.1 g/dL (ref 13.0–17.0)
Lymphocytes Relative: 22.5 % (ref 12.0–46.0)
Lymphs Abs: 1.6 10*3/uL (ref 0.7–4.0)
MCHC: 32.9 g/dL (ref 30.0–36.0)
MCV: 92.1 fl (ref 78.0–100.0)
Monocytes Absolute: 0.7 10*3/uL (ref 0.1–1.0)
Monocytes Relative: 9.9 % (ref 3.0–12.0)
Neutro Abs: 4.7 10*3/uL (ref 1.4–7.7)
Neutrophils Relative %: 64.1 % (ref 43.0–77.0)
Platelets: 148 10*3/uL — ABNORMAL LOW (ref 150.0–400.0)
RBC: 4.33 Mil/uL (ref 4.22–5.81)
RDW: 15.3 % (ref 11.5–15.5)
WBC: 7.3 10*3/uL (ref 4.0–10.5)

## 2021-10-24 LAB — COMPREHENSIVE METABOLIC PANEL
ALT: 18 U/L (ref 0–53)
AST: 15 U/L (ref 0–37)
Albumin: 4.1 g/dL (ref 3.5–5.2)
Alkaline Phosphatase: 78 U/L (ref 39–117)
BUN: 19 mg/dL (ref 6–23)
CO2: 27 mEq/L (ref 19–32)
Calcium: 9.5 mg/dL (ref 8.4–10.5)
Chloride: 105 mEq/L (ref 96–112)
Creatinine, Ser: 1.52 mg/dL — ABNORMAL HIGH (ref 0.40–1.50)
GFR: 48.42 mL/min — ABNORMAL LOW (ref >60.00)
Glucose, Bld: 151 mg/dL — ABNORMAL HIGH (ref 70–99)
Potassium: 4.3 mEq/L (ref 3.5–5.1)
Sodium: 138 mEq/L (ref 135–145)
Total Bilirubin: 1.8 mg/dL — ABNORMAL HIGH (ref 0.2–1.2)
Total Protein: 7.5 g/dL (ref 6.0–8.3)

## 2021-10-24 MED ORDER — IOHEXOL 300 MG/ML  SOLN
100.0000 mL | Freq: Once | INTRAMUSCULAR | Status: AC | PRN
  Administered 2021-10-24: 80 mL via INTRAVENOUS

## 2021-10-24 MED ORDER — AMOXICILLIN-POT CLAVULANATE 875-125 MG PO TABS
1.0000 | ORAL_TABLET | Freq: Two times a day (BID) | ORAL | 0 refills | Status: DC
  Filled 2021-10-24: qty 20, 10d supply, fill #0

## 2021-10-24 NOTE — Telephone Encounter (Signed)
Nurse Assessment ?Nurse: Kathi Ludwig, RN, Leana Roe Date/Time Eilene Ghazi Time): 10/24/2021 7:38:25 AM ?Confirm and document reason for call. If ?symptomatic, describe symptoms. ?---Caller states severe abd pain started last night. left ?side mid abd. increase in urination. no burning or ?fever. no back pain ?Does the patient have any new or worsening ?symptoms? ---Yes ?Will a triage be completed? ---Yes ?Related visit to physician within the last 2 weeks? ---No ?Does the PT have any chronic conditions? (i.e. ?diabetes, asthma, this includes High risk factors for ?pregnancy, etc.) ?---Yes ?List chronic conditions. ---CAD, DM, HTN ?Is this a behavioral health or substance abuse call? ---No ?Guidelines ?Guideline Title Affirmed Question Affirmed Notes Nurse Date/Time (Eastern ?Time) ?Abdominal Pain - ?Male ?[1] MILDMODERATE pain ?AND [2] constant ?AND [3] present > 2 ?hours ?Kathi Ludwig, RN, Tracie 10/24/2021 7:40:09 ?AM ?Disp. Time (Eastern ?Time) Disposition Final User ?10/24/2021 7:36:35 AM Send to Urgent Queue [facility] ?PLEASE NOTE: All timestamps contained within this report are represented as [place] Standard Time. ?CONFIDENTIALTY NOTICE: This fax transmission is intended only for the addressee. It contains information that is legally privileged, confidential or ?otherwise protected from use or disclosure. If you are not the intended recipient, you are strictly prohibited from reviewing, disclosing, copying using ?or disseminating any of this information or taking any action in reliance on or regarding this information. If you have received this fax in error, please ?notify us immediately by telephone so that we can arrange for its return to [place]. Phone: [phone], Toll-Free: [phone], Fax: [phone] ?Page: 2 of 2 ?Call Id: [id number] ?10/24/2021 7:41:52 AM See HCP within 4 Hours (or ?PCP triage) ?Yes Kathi Ludwig, RN, Leana Roe ?Caller Disagree/Comply Comply ?Caller Understands Yes ?PreDisposition Call  Doctor ?Care Advice Given Per Guideline ?SEE HCP (OR PCP TRIAGE) WITHIN 4 HOURS: * IF OFFICE WILL BE OPEN: You need to be seen within the next 3 or 4 ?hours. Call your doctor (or NP/PA) now or as soon as the office opens. NOTHING BY MOUTH: * Do not eat or drink anything for ?now. REST: * Lie down. * Rest until you are seen. * You become worse CARE ADVICE given per Abdominal Pain, Male (Adult) ?guideline. CALL BACK IF: ?Comments ?User: Estevan Ryder, RN Date/Time Eilene Ghazi Time): 10/24/2021 7:40:59 AM ?pain "2-3" gets better with urination ?Referrals ?REFERRED TO PCP OFFICE ?

## 2021-10-24 NOTE — Progress Notes (Signed)
? ?Subjective:  ? ? Patient ID: James [name], male    DOB: Jun 22, 1956, 66 y.o.   MRN: [id number] ? ?DOS:  10/24/2021 ?Type of visit - description: Acute ? ?Chief complaint is lower abdominal pain, started yesterday, had 2 episodes of watery diarrhea without blood. ? ?No fever chills ?Appetite is okay ?No nausea or vomiting ?He mention dysuria initially but on further questioning he has some lower abdominal discomfort  with urination.  No dysuria, gross hematuria or difficulty urinating. ? ?Review of Systems ?See above  ? ?Past Medical History:  ?Diagnosis Date  ? Abnormal cardiac CT angiography   ? Acid reflux   ? Annual physical exam 04/08/2015  ? Arthritis   ? Atypical chest pain 06/13/2020  ? Blood transfusion without reported diagnosis   ? Body mass index (BMI) 35.0-35.9, adult 04/05/2019  ? Chronic fatigue 01/28/2015  ? Chronic headaches   ? on cymbalta  ? Chronic migraine w/o aura, not intractable, w/o stat migr 10/24/2018  ? Cirrhosis ([place])   ? Colon polyps   ? Complication of anesthesia   ? problems waking up from anesthesia  ? Coronary artery disease 01/23/2019  ? Depression   ? on cymbalta  ? Diabetes mellitus with neuropathy ([place])   ? Diabetes with neuropathy 04/25/2013  ? Diverticulitis 03/2013  ? Dyslipidemia 05/29/2019  ? Eczema   ? Elevated LFTs   ? Epidural lipomatosis 10/05/2018  ? Essential (primary) hypertension 04/05/2019  ? Essential hypertension 10/10/2019  ? Fatty liver   ? GERD (gastroesophageal reflux disease) 04/28/2011  ? H/O craniotomy 05/07/2015  ? Headache 04/28/2011  ? Hepatitis 10/2017  ? NASH cirrhosis  ? History of kidney stones   ? Hyperlipidemia   ? Hypersomnia with sleep apnea 01/28/2015  ? Hypertension   ? IDA (iron deficiency anemia) 01/24/2019  ? Idiopathic intracranial hypertension 01/14/2017  ? Insomnia 04/26/2013  ? Kidney stone   ? Liver cirrhosis secondary to NASH (nonalcoholic steatohepatitis) ([place]) 01/02/2016  ? Low back pain 04/05/2019  ? Lower back injury 08/14/2019  ? Morbid obesity  ([place])   ? Neuromuscular disorder ([place])   ? neuropathy  ? Neuropathy   ? Nonalcoholic steatohepatitis 01/28/5002  ? Obstructive hydrocephalus ([place]) 01/28/2015  ? OSA -- dx ~ 2012, cpap intolerant 09/04/2014  ?  dx ~ 2012, cpap intolerant   ? PCP NOTES >>> 04/08/2015  ? Post-op pain 03/19/2019  ? Post-traumatic hydrocephalus ([facility]CC)   ? s/p shunts x 2 (first got infected )  ? Presence of cerebrospinal fluid drainage device 07/28/2011  ? Psoriasis   ? sees Dr [name]  ? Psoriatic arthritis ([place])   ? REM behavioral disorder 01/14/2017  ? 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  ? see's Dr.Ortmann  ? Severe obesity (BMI >= 40) ([place]) 01/28/2015  ? SI (sacroiliac) joint dysfunction 08/14/2019  ? Sigmoid diverticulitis 04/25/2013  ? Sleep apnea   ? no CPAP     ? Spondylolisthesis, lumbar region 03/16/2019  ? Stomach ulcer   ? Testosterone deficiency 04/28/2011  ? VP (ventriculoperitoneal) shunt status 07/31/2020  ? ? ?Past Surgical History:  ?Procedure Laterality Date  ? [place]  ? BRAIN SURGERY    ? VP shunts placed in 2007  ? CHOLECYSTECTOMY N/A 08/25/2017  ? Procedure: LAPAROSCOPIC CHOLECYSTECTOMY WITH INTRAOPERATIVE CHOLANGIOGRAM;  Surgeon: [name], MD;  Location: [facility];  Service: General;  Laterality: N/A;  ? COLONOSCOPY    ? CORONARY STENT INTERVENTION N/A 09/27/2020  ? Procedure: CORONARY  STENT INTERVENTION;  Surgeon: Jettie Booze, MD;  Location: [facility];  Service: Cardiovascular;  Laterality: N/A;  ? INTRAVASCULAR ULTRASOUND/IVUS N/A 09/27/2020  ? Procedure: Intravascular Ultrasound/IVUS;  Surgeon: Jettie Booze, MD;  Location: [facility];  Service: Cardiovascular;  Laterality: N/A;  ? JOINT REPLACEMENT    ? total hip  ? LEFT HEART CATH N/A 09/27/2020  ? Procedure: Left Heart Cath;  Surgeon: Jettie Booze, MD;  Location: [facility];  Service: Cardiovascular;  Laterality: N/A;  ? LEFT HEART CATH AND CORONARY ANGIOGRAPHY N/A 09/24/2020  ? Procedure: LEFT HEART  CATH AND CORONARY ANGIOGRAPHY;  Surgeon: Jettie Booze, MD;  Location: [facility];  Service: Cardiovascular;  Laterality: N/A;  ? LEFT HEART CATH AND CORONARY ANGIOGRAPHY N/A 08/18/2021  ? Procedure: LEFT HEART CATH AND CORONARY ANGIOGRAPHY;  Surgeon: Troy Sine, MD;  Location: [facility];  Service: Cardiovascular;  Laterality: N/A;  ? LUMBAR FUSION  03/16/2019  ? SHOULDER SURGERY Left 2010  ? TOE SURGERY Left 2018  ? TONSILLECTOMY    ? as a child  ? TOTAL HIP ARTHROPLASTY Left 2011  ? UPPER GASTROINTESTINAL ENDOSCOPY  01/04/2020  ? VENTRICULOPERITONEAL SHUNT  2007  ? x2  ? ? ?Current Outpatient Medications  ?Medication Instructions  ? acetaminophen (TYLENOL) 650 mg, Oral, Every 4 hours PRN  ? Adalimumab (HUMIRA PEN) 40 MG/0.8ML PNKT INJECT 40 MG (0.8 ML) UNDER THE SKIN EVERY OTHER WEEK.  ? amLODipine (NORVASC) 5 mg, Oral, Daily  ? aspirin 81 mg, Oral, Daily  ? atorvastatin (LIPITOR) 80 mg, Oral, Daily at bedtime  ? carvedilol (COREG) 12.5 MG tablet TAKE 1 TABLET (12.5 MG TOTAL) BY MOUTH 2 (TWO) TIMES DAILY WITH A MEAL.  ? clonazePAM (KLONOPIN) 0.5 MG tablet TAKE 1 TABLET BY MOUTH EVERY NIGHT AT BEDTIME  ? clopidogrel (PLAVIX) 75 MG tablet TAKE 1 TABLET (75 MG TOTAL) BY MOUTH DAILY.  ? Continuous Blood Gluc Sensor (FREESTYLE LIBRE 14 DAY SENSOR) MISC Apply 1 sensor every 14 (fourteen) days.  ? DULoxetine (CYMBALTA) 60 MG capsule TAKE 2 CAPSULES (120 MG TOTAL) BY MOUTH DAILY.  ? EPINEPHrine (EPIPEN 2-PAK) 0.3 mg, Intramuscular, Once PRN  ? fenofibrate micronized (LOFIBRA) 134 MG capsule TAKE 1 CAPSULE BY MOUTH THREE TIMES WEEKLY  ? gabapentin (NEURONTIN) 600 MG tablet TAKE 1 TABLET (600 MG TOTAL) BY MOUTH 2 (TWO) TIMES DAILY.  ? glimepiride (AMARYL) 4 MG tablet TAKE 1 TABLET BY MOUTH ONCE DAILY BEFORE BREAKFAST  ? isosorbide mononitrate (IMDUR) 60 mg, Oral, Daily  ? metFORMIN (GLUCOPHAGE) 1,000 mg, Oral, 2 times daily  ? nitroGLYCERIN (NITROSTAT) 0.4 MG SL tablet Place 1 tablet (0.4 mg  total) under the tongue every 5 (five) minutes for 3 doses as needed for chest pain.  ? pantoprazole (PROTONIX) 40 mg, Oral, Daily  ? Semaglutide, 2 MG/DOSE, (OZEMPIC, 2 MG/DOSE,) 8 MG/3ML SOPN Inject 2 mg into the skin weekly  ? ? ?   ?Objective:  ? Physical Exam ?BP 136/84 (BP Location: Left Arm, Patient Position: Sitting, Cuff Size: Normal)   Pulse 81   Temp 98.5 ?F (36.9 ?C) (Oral)   Resp 18   Ht 5' 11"  (1.803 m)   Wt 246 lb 4 oz (111.7 kg)   SpO2 98%   BMI 34.34 kg/m?  ?General:   ?Well developed, NAD, BMI noted.  ?HEENT:  ?Normocephalic . Face symmetric, atraumatic ?Lungs:  ?CTA B ?Normal respiratory effort, no intercostal retractions, no accessory muscle use. ?  Heart: RRR,  no murmur.  ?Abdomen:  ?Not distended, mild to moderately tender to palpation at the LLQ and some at the suprapubic area.  No mass, no rebound, no rigidity. ?Skin: Not pale. Not jaundice ?Lower extremities: no pretibial edema bilaterally  ?Neurologic:  ?alert & oriented X3.  ?Speech normal, gait appropriate for age and unassisted ?Psych--  ?Cognition and judgment appear intact.  ?Cooperative with normal attention span and concentration.  ?Behavior appropriate. ?No anxious or depressed appearing. ? ?   ?Assessment   ? ?  ?Assessment  ?DM - Dr [name] ?Neuropathy (x years, rx gaba 05-2014, w/u 11-2014  RPR neg, vit D-B12-Folic Acid wnl ); Saw Dr Posey Pronto, NCS [phone] (see results) ?HTN: History of AKI with ARBs  ?CRI, sees nephrology, etiology felt to be hHyperglycemia, intermittent NSAIDs, contrast exposures ?Hyperlipidemia (TG in the 500s 2016) ?OSA , dx 2012, sleep study again 02-2015 Dr Dohmeier--> severe OSA, intolerant to CPAP ?Depression, insomnia: on Cymbalta ?NEURO: ?--Chronic headaches :on Cymbalta  ?--Posttraumatic hydrocephalus s/p 2 shunts (first got infected) ?MSK: on disability d/t back pain- HAs ?GI:  ?--GERD, diverticulitis 2014, h/o PUD ?-- NASH with cirrhosis per GI note 10/2017, s/p Hep A/B shots ?--Anemia: ?- felt to be  d/t   GAVE (gastric antral vascular ectasia) and a inflammatory polyp, s/p  EGD 10/2018    ?-Work-up repeated 10/2019: ?EGD: GAVE  versus portal hypertensive gastropathy. ?Colonoscopy polyps.  Tubular adenoma. ?Gastric

## 2021-10-24 NOTE — Patient Instructions (Signed)
Go to the lab, provided a blood and urine sample ? ?We will proceed with a CAT scan this afternoon, you can go to the first floor and schedule it. ?My concern is diverticulitis. ? ? ?Drink plenty of fluids ? ?Avoid any NSAIDs ? ?Go to the ER if: Fever, chills, severe pain, nausea or vomiting. ? ?

## 2021-10-25 LAB — URINE CULTURE
MICRO NUMBER:: 13326398
Result:: NO GROWTH
SPECIMEN QUALITY:: ADEQUATE

## 2021-10-25 NOTE — Assessment & Plan Note (Signed)
Abdominal pain: ?As described above, on clinical grounds suspect diverticulitis (he had episode before), DDx also includes UTI, urolithiasis, colitis   (w/ h/o recent diarrhea), others.   ?Plan: Stat CMP, CBC, UA urine culture.  Proceed with a CT abdomen and pelvis with results. ?ER if symptoms increase. ?Further advised with results ?Addendum: ?CT showed diverticulitis.  Given multiple medical problems, I am electing to prescribe antibiotics. ?Will Rx Augmentin x10 days.  Call if not better.  Will notify patient. ?Other findings include cirrhosis with mild splenomegaly (cirrhosis is well-known) ? ? ?

## 2021-10-28 ENCOUNTER — Other Ambulatory Visit (HOSPITAL_COMMUNITY): Payer: Self-pay

## 2021-10-29 ENCOUNTER — Encounter: Payer: Self-pay | Admitting: Plastic Surgery

## 2021-10-29 ENCOUNTER — Ambulatory Visit (INDEPENDENT_AMBULATORY_CARE_PROVIDER_SITE_OTHER): Payer: 59 | Admitting: Plastic Surgery

## 2021-10-29 VITALS — BP 123/77 | HR 75 | Ht 71.0 in | Wt 248.4 lb

## 2021-10-29 DIAGNOSIS — S8012XA Contusion of left lower leg, initial encounter: Secondary | ICD-10-CM | POA: Diagnosis not present

## 2021-10-29 DIAGNOSIS — R222 Localized swelling, mass and lump, trunk: Secondary | ICD-10-CM | POA: Diagnosis not present

## 2021-10-29 NOTE — Progress Notes (Signed)
Referring Provider Colon Branch, MD [address] [place],  [place] [address]   CC:  Chief Complaint  Patient presents with   Consult           James [name] is an 66 y.o. male.  HPI: Patient presents with 2 issues.  The first of which is involving his left lower extremity.  He had blunt trauma to that area about a month ago and had a significant amount of swelling.  Will intermittently drain.  He wants to see if anything can be done in that spot.  Additionally he reports on his right lower back a palpable subcutaneous mass that is new.  He has had a lumbar spinal operation but this is several centimeters lateral to that on the right side.  He does report that that mass is tender and bothers him when he sitting on it.  Allergies  Allergen Reactions   Bee Venom Anaphylaxis   Hydrocodone Bit-Homatrop Mbr Other (See Comments)    Hallucinations, confusion, delirium Depressed feeling   Toradol [Ketorolac Tromethamine] Other (See Comments)    Hallucinations, confusion, delirium   Sulfadiazine     NDC [id number]:[id number] NDC OBSJ:[id number] NDC [id number]:[id number]   Morphine And Related Other (See Comments)    Hallucinations, back in the 80s. States has taken vicodin before w/o problems    Sulfa Drugs Cross Reactors Rash    Outpatient Encounter Medications as of 10/29/2021  Medication Sig Note   acetaminophen (TYLENOL) 325 MG tablet Take 2 tablets (650 mg total) by mouth every 4 (four) hours as needed for headache or mild pain.    Adalimumab (HUMIRA PEN) 40 MG/0.8ML PNKT INJECT 40 MG (0.8 ML) UNDER THE SKIN EVERY OTHER WEEK. (Patient taking differently: Inject 40 mg into the skin every 14 (fourteen) days.) 08/15/2021: Pt normally takes on Fridays, states he took it on Monday   amLODipine (NORVASC) 5 MG tablet Take 1 tablet (5 mg total) by mouth daily.    amoxicillin-clavulanate (AUGMENTIN) 875-125 MG tablet Take 1 tablet by mouth 2 (two) times daily.    aspirin 81 MG chewable  tablet Chew 1 tablet (81 mg total) by mouth daily.    atorvastatin (LIPITOR) 80 MG tablet Take 1 tablet (80 mg total) by mouth at bedtime.    carvedilol (COREG) 12.5 MG tablet TAKE 1 TABLET (12.5 MG TOTAL) BY MOUTH 2 (TWO) TIMES DAILY WITH A MEAL. (Patient taking differently: Take 12.5 mg by mouth 2 (two) times daily with a meal.) 08/15/2021: Pt states he takes both doses at bedtime   clonazePAM (KLONOPIN) 0.5 MG tablet TAKE 1 TABLET BY MOUTH EVERY NIGHT AT BEDTIME (Patient taking differently: Take 0.5 mg by mouth at bedtime.)    clopidogrel (PLAVIX) 75 MG tablet TAKE 1 TABLET (75 MG TOTAL) BY MOUTH DAILY.    Continuous Blood Gluc Sensor (FREESTYLE LIBRE 14 DAY SENSOR) MISC Apply 1 sensor every 14 (fourteen) days.    DULoxetine (CYMBALTA) 60 MG capsule TAKE 2 CAPSULES (120 MG TOTAL) BY MOUTH DAILY. (Patient taking differently: Take 60 mg by mouth daily.)    EPINEPHrine (EPIPEN 2-PAK) 0.3 mg/0.3 mL IJ SOAJ injection Inject 0.3 mLs (0.3 mg total) into the muscle once as needed for up to 1 dose for anaphylaxis. 08/21/2021: PRN   fenofibrate micronized (LOFIBRA) 134 MG capsule TAKE 1 CAPSULE BY MOUTH THREE TIMES WEEKLY    gabapentin (NEURONTIN) 600 MG tablet TAKE 1 TABLET (600 MG TOTAL) BY MOUTH 2 (TWO) TIMES DAILY. (Patient taking  differently: Take 600 mg by mouth 2 (two) times daily.)    glimepiride (AMARYL) 4 MG tablet TAKE 1 TABLET BY MOUTH ONCE DAILY BEFORE BREAKFAST (Patient taking differently: Take 4 mg by mouth daily with breakfast.)    isosorbide mononitrate (IMDUR) 60 MG 24 hr tablet Take 1 tablet (60 mg total) by mouth daily.    metFORMIN (GLUCOPHAGE) 500 MG tablet Take 2 tablets (1,000 mg total) by mouth 2 (two) times daily.    nitroGLYCERIN (NITROSTAT) 0.4 MG SL tablet Place 1 tablet (0.4 mg total) under the tongue every 5 (five) minutes for 3 doses as needed for chest pain. 09/30/2021: PRN   pantoprazole (PROTONIX) 40 MG tablet Take 1 tablet (40 mg total) by mouth daily.    Semaglutide, 2  MG/DOSE, (OZEMPIC, 2 MG/DOSE,) 8 MG/3ML SOPN Inject 2 mg into the skin weekly (Patient taking differently: Inject 2 mg into the skin once a week. Inject 2 mg into the skin weekly)    No facility-administered encounter medications on file as of 10/29/2021.     Past Medical History:  Diagnosis Date   Abnormal cardiac CT angiography    Acid reflux    Annual physical exam 04/08/2015   Arthritis    Atypical chest pain 06/13/2020   Blood transfusion without reported diagnosis    Body mass index (BMI) 35.0-35.9, adult 04/05/2019   Chronic fatigue 01/28/2015   Chronic headaches    on cymbalta   Chronic migraine w/o aura, not intractable, w/o stat migr 10/24/2018   Cirrhosis ([place])    Colon polyps    Complication of anesthesia    problems waking up from anesthesia   Coronary artery disease 01/23/2019   Depression    on cymbalta   Diabetes mellitus with neuropathy ([place])    Diabetes with neuropathy 04/25/2013   Diverticulitis 03/2013   Dyslipidemia 05/29/2019   Eczema    Elevated LFTs    Epidural lipomatosis 10/05/2018   Essential (primary) hypertension 04/05/2019   Essential hypertension 10/10/2019   Fatty liver    GERD (gastroesophageal reflux disease) 04/28/2011   H/O craniotomy 05/07/2015   Headache 04/28/2011   Hepatitis 10/2017   NASH cirrhosis   History of kidney stones    Hyperlipidemia    Hypersomnia with sleep apnea 01/28/2015   Hypertension    IDA (iron deficiency anemia) 01/24/2019   Idiopathic intracranial hypertension 01/14/2017   Insomnia 04/26/2013   Kidney stone    Liver cirrhosis secondary to NASH (nonalcoholic steatohepatitis) ([place]) 01/02/2016   Low back pain 04/05/2019   Lower back injury 08/14/2019   Morbid obesity ([place])    Neuromuscular disorder ([place])    neuropathy   Neuropathy    Nonalcoholic steatohepatitis 0/11/2692   Obstructive hydrocephalus ([place]) 01/28/2015   OSA -- dx ~ 2012, cpap intolerant 09/04/2014    dx ~ 2012, cpap intolerant    PCP NOTES >>> 04/08/2015    Post-op pain 03/19/2019   Post-traumatic hydrocephalus ([place])    s/p shunts x 2 (first got infected )   Presence of cerebrospinal fluid drainage device 07/28/2011   Psoriasis    sees Dr [name]   Psoriatic arthritis ([place])    REM behavioral disorder 01/14/2017   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   see's Dr.Ortmann   Severe obesity (BMI >= 40) ([place]) 01/28/2015   SI (sacroiliac) joint dysfunction 08/14/2019   Sigmoid diverticulitis 04/25/2013   Sleep apnea    no CPAP      Spondylolisthesis, lumbar region 03/16/2019   Stomach  ulcer    Testosterone deficiency 04/28/2011   VP (ventriculoperitoneal) shunt status 07/31/2020    Past Surgical History:  Procedure Laterality Date   BACK SURGERY  1980   BRAIN SURGERY     VP shunts placed in 2007   CHOLECYSTECTOMY N/A 08/25/2017   Procedure: LAPAROSCOPIC CHOLECYSTECTOMY WITH INTRAOPERATIVE CHOLANGIOGRAM;  Surgeon: [name], MD;  Location: [facility];  Service: General;  Laterality: N/A;   COLONOSCOPY     CORONARY STENT INTERVENTION N/A 09/27/2020   Procedure: CORONARY STENT INTERVENTION;  Surgeon: Jettie Booze, MD;  Location: [facility];  Service: Cardiovascular;  Laterality: N/A;   INTRAVASCULAR ULTRASOUND/IVUS N/A 09/27/2020   Procedure: Intravascular Ultrasound/IVUS;  Surgeon: Jettie Booze, MD;  Location: [facility];  Service: Cardiovascular;  Laterality: N/A;   JOINT REPLACEMENT     total hip   LEFT HEART CATH N/A 09/27/2020   Procedure: Left Heart Cath;  Surgeon: Jettie Booze, MD;  Location: [facility];  Service: Cardiovascular;  Laterality: N/A;   LEFT HEART CATH AND CORONARY ANGIOGRAPHY N/A 09/24/2020   Procedure: LEFT HEART CATH AND CORONARY ANGIOGRAPHY;  Surgeon: Jettie Booze, MD;  Location: [facility];  Service: Cardiovascular;  Laterality: N/A;   LEFT HEART CATH AND CORONARY ANGIOGRAPHY N/A 08/18/2021   Procedure: LEFT HEART CATH AND CORONARY ANGIOGRAPHY;  Surgeon:  Troy Sine, MD;  Location: [facility];  Service: Cardiovascular;  Laterality: N/A;   LUMBAR FUSION  03/16/2019   SHOULDER SURGERY Left 2010   TOE SURGERY Left 2018   TONSILLECTOMY     as a child   TOTAL HIP ARTHROPLASTY Left 2011   UPPER GASTROINTESTINAL ENDOSCOPY  01/04/2020   VENTRICULOPERITONEAL SHUNT  2007   x2    Family History  Problem Relation Age of Onset   Other Mother    Lung cancer Father        alive, former smoker    Heart disease Brother        MI age 57   Other Brother        Murdered   Down syndrome Son    Diabetes Neg Hx    Prostate cancer Neg Hx    Colon cancer Neg Hx    Stomach cancer Neg Hx    Pancreatic cancer Neg Hx    Liver disease Neg Hx     Social History   Social History Narrative   Household-- pt , wife, one adult son with Down's syndrome   younger son lives in [place] 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     Review of Systems General: Denies fevers, chills, weight loss CV: Denies chest pain, shortness of breath, palpitations  Physical Exam    10/29/2021   10:45 AM 10/24/2021    9:23 AM 10/13/2021    1:55 PM  Vitals with BMI  Height 5' 11"  5' 11"  5' 11"   Weight 248 lbs 6 oz 246 lbs 4 oz 245 lbs 2 oz  BMI 34.66 40.98 11.9  Systolic 147 829 562  Diastolic 77 84 72  Pulse 75 81 70    General:  No acute distress,  Alert and oriented, Non-Toxic, Normal speech and affect Left lower extremity: Patient has an area of subcutaneous fluid in the anterior left lower leg.  Small superficial scab in the superior lateral aspect which she says is where some of the drainage has been coming from.  No  fluctuance or erythema or signs of infection. Right lower back shows what feels like a 4 to 5 cm subcutaneous mass that is freely mobile.  No overlying skin changes or scars.  There is a midline scar from his back surgery in his left lower back scar from bone graft harvest for his spine  procedures.  Assessment/Plan Patient presents with what looks like a hematoma in the left lower leg.  I did offer to aspirate this for him today which she agreed to move forward with.  There was prepped and alcohol pad an 18-gauge needle was used to aspirate about 10 cc of sanguinous fluid.  This appeared to totally resolve the palpable area of concern.  Pressure wrap was applied afterwards. Regarding the right lower back mass some can order an ultrasound to help from a diagnostic standpoint and if it is a simple subcutaneous mass I can plan surgical excision of this in the operating room.  We discussed risks and benefits that include bleeding, infection, damage to surrounding structures and need for additional procedures.  We will plan to move forward once we get the ultrasound results.  All of his questions were answered.  Cindra Presume 10/29/2021, 1:01 PM

## 2021-10-30 ENCOUNTER — Other Ambulatory Visit (INDEPENDENT_AMBULATORY_CARE_PROVIDER_SITE_OTHER): Payer: 59

## 2021-10-30 DIAGNOSIS — E1165 Type 2 diabetes mellitus with hyperglycemia: Secondary | ICD-10-CM | POA: Diagnosis not present

## 2021-10-30 LAB — BASIC METABOLIC PANEL
BUN: 21 mg/dL (ref 6–23)
CO2: 25 mEq/L (ref 19–32)
Calcium: 9.9 mg/dL (ref 8.4–10.5)
Chloride: 104 mEq/L (ref 96–112)
Creatinine, Ser: 1.41 mg/dL (ref 0.40–1.50)
GFR: 52.98 mL/min — ABNORMAL LOW (ref >60.00)
Glucose, Bld: 147 mg/dL — ABNORMAL HIGH (ref 70–99)
Potassium: 4.3 mEq/L (ref 3.5–5.1)
Sodium: 138 mEq/L (ref 135–145)

## 2021-10-30 LAB — HEMOGLOBIN A1C: Hgb A1c MFr Bld: 5.9 % (ref 4.6–6.5)

## 2021-11-04 ENCOUNTER — Ambulatory Visit (INDEPENDENT_AMBULATORY_CARE_PROVIDER_SITE_OTHER): Payer: 59 | Admitting: Endocrinology

## 2021-11-04 ENCOUNTER — Other Ambulatory Visit (HOSPITAL_BASED_OUTPATIENT_CLINIC_OR_DEPARTMENT_OTHER): Payer: Self-pay

## 2021-11-04 ENCOUNTER — Encounter: Payer: Self-pay | Admitting: Endocrinology

## 2021-11-04 VITALS — BP 138/86 | HR 67 | Ht 71.0 in | Wt 252.6 lb

## 2021-11-04 DIAGNOSIS — E1165 Type 2 diabetes mellitus with hyperglycemia: Secondary | ICD-10-CM

## 2021-11-04 DIAGNOSIS — Z6835 Body mass index (BMI) 35.0-35.9, adult: Secondary | ICD-10-CM

## 2021-11-04 DIAGNOSIS — K7581 Nonalcoholic steatohepatitis (NASH): Secondary | ICD-10-CM

## 2021-11-04 DIAGNOSIS — I200 Unstable angina: Secondary | ICD-10-CM

## 2021-11-04 DIAGNOSIS — K7460 Unspecified cirrhosis of liver: Secondary | ICD-10-CM | POA: Diagnosis not present

## 2021-11-04 DIAGNOSIS — E782 Mixed hyperlipidemia: Secondary | ICD-10-CM

## 2021-11-04 MED ORDER — OZEMPIC (2 MG/DOSE) 8 MG/3ML ~~LOC~~ SOPN
PEN_INJECTOR | SUBCUTANEOUS | 2 refills | Status: DC
  Filled 2021-11-04 – 2021-12-15 (×2): qty 9, 84d supply, fill #0
  Filled 2022-03-23: qty 3, 28d supply, fill #1
  Filled 2022-05-28: qty 3, 28d supply, fill #2

## 2021-11-04 NOTE — Progress Notes (Signed)
Patient ID: James [name], male   DOB: 1955-08-15, 66 y.o.   MRN: [id number]           Reason for Appointment: Follow-up for Type 2 Diabetes   History of Present Illness:          Date of diagnosis of type 2 diabetes mellitus:  ?  2014      Background history:  He is not clear how his diabetes was diagnosed, likely on routine testing. Initially had been treated with metformin and also tried on Amaryl Patient had progressive increase in his blood sugars since 1/17 with stopping metformin and being on the regimen of Amaryl and Januvia, he thinks his blood sugars went up to 601.  He was then started on basal bolus insulin   Lowest A1c was 6.8 previously  OMNIPOD insulin pump settings as follows Basal rates: 12 AM-6 AM = 0.1, 6 AM-11 AM = 0.5, 11 AM- 9:30 PM = 1.0 and 9:30 PM-12 AM = 1.2 Correction 1: 50 carbohydrate coverage 1: 1   Recent history:   INSULIN: None   Non-insulin hypoglycemic drugs the patient is taking are:  metformin 1000 mg a.m., 500 mg p.m. daily, Ozempic 1.0 mg weekly, Amaryl 2 mg daily   His A1c is improved at 5.9   Current management, blood sugar patterns and problems identified:  Even though he has gained 20 pounds since his last visit his blood sugar control is relatively better Was last seen in March With taking Amaryl in the evening his blood sugars are overall better controlled especially overnight  Continues to use the freestyle libre for monitoring and this shows 87% within target range  Previously could not tolerate 2 mg Ozempic and is taking the 1 mg dose using half dial of the 2 mg pen However now he says that he is forgetting to take this, normally takes this on Friday His medication history indicates he had a prescription filled in December and then again about 3 weeks ago Also if he is traveling he will not take his pen with him as he does not understand it can be left at room temperature No hypoglycemia on most days but he has had a  couple of low readings around 4 AM and 6 PM On the sensor HYPOGLYCEMIA is present 2% of the time METFORMIN was reduced to 500 mg in the evening on the last visit to reduce hypoglycemia Otherwise he has sporadic postprandial hyperglycemia with POSTPRANDIAL readings as high as 262 at different times He is not watching his diet and periodically eating sweets or high fat meals  Blood sugar AVERAGES from his libre on that to hourly basis showing LOWEST average of 106 early morning and highest 193 late at night Overall AVERAGE 135  PREVIOUS data:  CGM use % of time 51  2-week average/GV 138  Time in range 89%  % Time Above 180 10+1  % Time above 250   % Time Below 70      PRE-MEAL Fasting Lunch Dinner Bedtime Overall  Glucose range:       Averages: 120       POST-MEAL PC Breakfast PC Lunch PC Dinner  Glucose range:     Averages:  159 154      Side  effects from medications have been: None  Compliance with the medical regimen: Fair  Glucose monitoring:   Glucometer:  Freestyle libre 14-day  Recent CGM data interpretation as follows from 8/15 through 8/28  Blood sugar monitoring is significantly more complete compared to the last time however he has been out of his sensor for the last 3 weeks HYPERGLYCEMIA occurs only late at night after about 10 PM with blood sugar ranging generally between 160-200 Postprandial hyperglycemia during the day as only occasional late morning or afternoon However overall rise in blood sugar compared to Premeal readings is not excessive Blood sugars are excellent early morning and averaging in the 120s between 4-8 AM No hypoglycemia In the first week of the recording he was having somewhat more high readings after meals including breakfast Overall blood sugars are lower compared to last visit when he was only having 56% within target range    Self-care:   Meal times are:  Breakfast variable, lunch: 12-3 PM Dinner: 6 PM    Dietician visit, most  recent:09/2015               CDE consultation: 12/2018  Weight history:  Wt Readings from Last 3 Encounters:  11/04/21 252 lb 9.6 oz (114.6 kg)  10/29/21 248 lb 6.4 oz (112.7 kg)  10/24/21 246 lb 4 oz (111.7 kg)    Glycemic control:   Lab Results  Component Value Date   HGBA1C 5.9 10/30/2021   HGBA1C 6.5 (H) 08/15/2021   HGBA1C 6.8 (H) 03/04/2021   Lab Results  Component Value Date   MICROALBUR 5.5 (H) 03/04/2021   LDLCALC 55 08/16/2021   CREATININE 1.41 10/30/2021    Lab on 10/30/2021  Component Date Value Ref Range Status   Sodium 10/30/2021 138  135 - 145 mEq/L Final   Potassium 10/30/2021 4.3  3.5 - 5.1 mEq/L Final   Chloride 10/30/2021 104  96 - 112 mEq/L Final   CO2 10/30/2021 25  19 - 32 mEq/L Final   Glucose, Bld 10/30/2021 147 (H)  70 - 99 mg/dL Final   BUN 16/03/9603 21  6 - 23 mg/dL Final   Creatinine, Ser 10/30/2021 1.41  0.40 - 1.50 mg/dL Final   GFR 54/02/8118 52.98 (L)  >60.00 mL/min Final   Calculated using the CKD-EPI Creatinine Equation (2021)   Calcium 10/30/2021 9.9  8.4 - 10.5 mg/dL Final   Hgb J4N MFr Bld 10/30/2021 5.9  4.6 - 6.5 % Final   Glycemic Control Guidelines for People with Diabetes:Non Diabetic:  <6%Goal of Therapy: <7%Additional Action Suggested:  >8%        Allergies as of 11/04/2021       Reactions   Bee Venom Anaphylaxis   Hydrocodone Bit-homatrop Mbr Other (See Comments)   Hallucinations, confusion, delirium Depressed feeling   Toradol [ketorolac Tromethamine] Other (See Comments)   Hallucinations, confusion, delirium   Sulfadiazine    NDC [id number] NDC ONGE:[id number] NDC NUUV:[id number]   Morphine And Related Other (See Comments)   Hallucinations, back in the 80s. States has taken vicodin before w/o problems    Sulfa Drugs Cross Reactors Rash        Medication List        Accurate as of Nov 04, 2021  8:38 PM. If you have any questions, ask your nurse or doctor.          acetaminophen 325 MG  tablet Commonly known as: TYLENOL Take 2 tablets (650 mg total) by mouth every 4 (four) hours as needed for headache or mild pain.   amLODipine 5 MG tablet Commonly known as: NORVASC Take 1 tablet (5 mg total) by mouth daily.   amoxicillin-clavulanate 875-125 MG tablet Commonly known as: Augmentin  Take 1 tablet by mouth 2 (two) times daily.   aspirin 81 MG chewable tablet Chew 1 tablet (81 mg total) by mouth daily.   atorvastatin 80 MG tablet Commonly known as: LIPITOR Take 1 tablet (80 mg total) by mouth at bedtime.   carvedilol 12.5 MG tablet Commonly known as: COREG TAKE 1 TABLET (12.5 MG TOTAL) BY MOUTH 2 (TWO) TIMES DAILY WITH A MEAL. What changed: how much to take   clonazePAM 0.5 MG tablet Commonly known as: KLONOPIN TAKE 1 TABLET BY MOUTH EVERY NIGHT AT BEDTIME What changed: how much to take   clopidogrel 75 MG tablet Commonly known as: PLAVIX TAKE 1 TABLET (75 MG TOTAL) BY MOUTH DAILY.   DULoxetine 60 MG capsule Commonly known as: CYMBALTA TAKE 2 CAPSULES (120 MG TOTAL) BY MOUTH DAILY. What changed:  how much to take how to take this when to take this   EPINEPHrine 0.3 mg/0.3 mL Soaj injection Commonly known as: EpiPen 2-Pak Inject 0.3 mLs (0.3 mg total) into the muscle once as needed for up to 1 dose for anaphylaxis.   fenofibrate micronized 134 MG capsule Commonly known as: LOFIBRA TAKE 1 CAPSULE BY MOUTH THREE TIMES WEEKLY   FreeStyle Libre 14 Day Sensor Misc Apply 1 sensor every 14 (fourteen) days.   gabapentin 600 MG tablet Commonly known as: NEURONTIN TAKE 1 TABLET (600 MG TOTAL) BY MOUTH 2 (TWO) TIMES DAILY. What changed:  how much to take how to take this when to take this   glimepiride 4 MG tablet Commonly known as: AMARYL TAKE 1 TABLET BY MOUTH ONCE DAILY BEFORE BREAKFAST What changed:  how much to take when to take this   Humira Pen 40 MG/0.8ML Pnkt Generic drug: Adalimumab INJECT 40 MG (0.8 ML) UNDER THE SKIN EVERY OTHER  WEEK. What changed:  how much to take how to take this when to take this   isosorbide mononitrate 60 MG 24 hr tablet Commonly known as: IMDUR Take 1 tablet (60 mg total) by mouth daily.   metFORMIN 500 MG tablet Commonly known as: GLUCOPHAGE Take 2 tablets (1,000 mg total) by mouth 2 (two) times daily.   nitroGLYCERIN 0.4 MG SL tablet Commonly known as: NITROSTAT Place 1 tablet (0.4 mg total) under the tongue every 5 (five) minutes for 3 doses as needed for chest pain.   Ozempic (2 MG/DOSE) 8 MG/3ML Sopn Generic drug: Semaglutide (2 MG/DOSE) Inject 2 mg into the skin weekly What changed:  how much to take how to take this when to take this   pantoprazole 40 MG tablet Commonly known as: PROTONIX Take 1 tablet (40 mg total) by mouth daily.        Allergies:  Allergies  Allergen Reactions   Bee Venom Anaphylaxis   Hydrocodone Bit-Homatrop Mbr Other (See Comments)    Hallucinations, confusion, delirium Depressed feeling   Toradol [Ketorolac Tromethamine] Other (See Comments)    Hallucinations, confusion, delirium   Sulfadiazine     NDC YQMV:[id number] NDC UXLK:[id number] NDC UYQI:[id number]   Morphine And Related Other (See Comments)    Hallucinations, back in the 80s. States has taken vicodin before w/o problems    Sulfa Drugs Cross Reactors Rash    Past Medical History:  Diagnosis Date   Abnormal cardiac CT angiography    Acid reflux    Annual physical exam 04/08/2015   Arthritis    Atypical chest pain 06/13/2020   Blood transfusion without reported diagnosis    Body mass index (BMI) 35.0-35.9, adult 04/05/2019  Chronic fatigue 01/28/2015   Chronic headaches    on cymbalta   Chronic migraine w/o aura, not intractable, w/o stat migr 10/24/2018   Cirrhosis (HCC)    Colon polyps    Complication of anesthesia    problems waking up from anesthesia   Coronary artery disease 01/23/2019   Depression    on cymbalta   Diabetes mellitus with neuropathy (HCC)     Diabetes with neuropathy 04/25/2013   Diverticulitis 03/2013   Dyslipidemia 05/29/2019   Eczema    Elevated LFTs    Epidural lipomatosis 10/05/2018   Essential (primary) hypertension 04/05/2019   Essential hypertension 10/10/2019   Fatty liver    GERD (gastroesophageal reflux disease) 04/28/2011   H/O craniotomy 05/07/2015   Headache 04/28/2011   Hepatitis 10/2017   NASH cirrhosis   History of kidney stones    Hyperlipidemia    Hypersomnia with sleep apnea 01/28/2015   Hypertension    IDA (iron deficiency anemia) 01/24/2019   Idiopathic intracranial hypertension 01/14/2017   Insomnia 04/26/2013   Kidney stone    Liver cirrhosis secondary to NASH (nonalcoholic steatohepatitis) (HCC) 01/02/2016   Low back pain 04/05/2019   Lower back injury 08/14/2019   Morbid obesity (HCC)    Neuromuscular disorder (HCC)    neuropathy   Neuropathy    Nonalcoholic steatohepatitis 10/05/2018   Obstructive hydrocephalus (HCC) 01/28/2015   OSA -- dx ~ 2012, cpap intolerant 09/04/2014    dx ~ 2012, cpap intolerant    PCP NOTES >>> 04/08/2015   Post-op pain 03/19/2019   Post-traumatic hydrocephalus (HCC)    s/p shunts x 2 (first got infected )   Presence of cerebrospinal fluid drainage device 07/28/2011   Psoriasis    sees Dr Lenis Noon   Psoriatic arthritis (HCC)    REM behavioral disorder 01/14/2017   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   see's Dr.Ortmann   Severe obesity (BMI >= 40) (HCC) 01/28/2015   SI (sacroiliac) joint dysfunction 08/14/2019   Sigmoid diverticulitis 04/25/2013   Sleep apnea    no CPAP      Spondylolisthesis, lumbar region 03/16/2019   Stomach ulcer    Testosterone deficiency 04/28/2011   VP (ventriculoperitoneal) shunt status 07/31/2020    Past Surgical History:  Procedure Laterality Date   BACK SURGERY  1980   BRAIN SURGERY     VP shunts placed in 2007   CHOLECYSTECTOMY N/A 08/25/2017   Procedure: LAPAROSCOPIC CHOLECYSTECTOMY WITH INTRAOPERATIVE CHOLANGIOGRAM;   Surgeon: Griselda Miner, MD;  Location: [facility] OR;  Service: General;  Laterality: N/A;   COLONOSCOPY     CORONARY STENT INTERVENTION N/A 09/27/2020   Procedure: CORONARY STENT INTERVENTION;  Surgeon: Corky Crafts, MD;  Location: [facility] INVASIVE CV LAB;  Service: Cardiovascular;  Laterality: N/A;   INTRAVASCULAR ULTRASOUND/IVUS N/A 09/27/2020   Procedure: Intravascular Ultrasound/IVUS;  Surgeon: Corky Crafts, MD;  Location: [facility] INVASIVE CV LAB;  Service: Cardiovascular;  Laterality: N/A;   JOINT REPLACEMENT     total hip   LEFT HEART CATH N/A 09/27/2020   Procedure: Left Heart Cath;  Surgeon: Corky Crafts, MD;  Location: [facility] INVASIVE CV LAB;  Service: Cardiovascular;  Laterality: N/A;   LEFT HEART CATH AND CORONARY ANGIOGRAPHY N/A 09/24/2020   Procedure: LEFT HEART CATH AND CORONARY ANGIOGRAPHY;  Surgeon: Corky Crafts, MD;  Location: [facility] INVASIVE CV LAB;  Service: Cardiovascular;  Laterality: N/A;   LEFT HEART CATH AND CORONARY ANGIOGRAPHY N/A 08/18/2021   Procedure: LEFT HEART CATH AND CORONARY  ANGIOGRAPHY;  Surgeon: Lennette Bihari, MD;  Location: [facility] INVASIVE CV LAB;  Service: Cardiovascular;  Laterality: N/A;   LUMBAR FUSION  03/16/2019   SHOULDER SURGERY Left 2010   TOE SURGERY Left 2018   TONSILLECTOMY     as a child   TOTAL HIP ARTHROPLASTY Left 2011   UPPER GASTROINTESTINAL ENDOSCOPY  01/04/2020   VENTRICULOPERITONEAL SHUNT  2007   x2    Family History  Problem Relation Age of Onset   Other Mother    Lung cancer Father        alive, former smoker    Heart disease Brother        MI age 39   Other Brother        Murdered   Down syndrome Son    Diabetes Neg Hx    Prostate cancer Neg Hx    Colon cancer Neg Hx    Stomach cancer Neg Hx    Pancreatic cancer Neg Hx    Liver disease Neg Hx     Social History:  reports that he has never smoked. He has never used smokeless tobacco. He reports that he does not drink alcohol and does not use drugs.    Review of  Systems      HYPERTENSION: On carvedilol with good control Hypertension managed by his PCP   BP Readings from Last 3 Encounters:  11/04/21 138/86  10/29/21 123/77  10/24/21 136/84   RENAL dysfunction: Has had mildly increased creatinine levels, relatively stable No history of microalbuminuria  Lab Results  Component Value Date   CREATININE 1.41 10/30/2021   CREATININE 1.52 (H) 10/24/2021   CREATININE 1.43 08/21/2021    Lipid history: He is on Lipitor  20 mg  Is on fenofibrate 3 times a week for high triglycerides,   LDL is below 70 and triglycerides are normal    Lab Results  Component Value Date   CHOL 118 08/16/2021   CHOL 139 03/04/2021   CHOL 145 12/31/2020   Lab Results  Component Value Date   HDL 43 08/16/2021   HDL 39.90 03/04/2021   HDL 37.30 (L) 12/31/2020   Lab Results  Component Value Date   LDLCALC 55 08/16/2021   LDLCALC 69 03/04/2021   LDLCALC 68 10/30/2020   Lab Results  Component Value Date   TRIG 101 08/16/2021   TRIG 151.0 (H) 03/04/2021   TRIG 298.0 (H) 12/31/2020   Lab Results  Component Value Date   CHOLHDL 2.7 08/16/2021   CHOLHDL 3 03/04/2021   CHOLHDL 4 12/31/2020   Lab Results  Component Value Date   LDLDIRECT 67.0 12/31/2020   LDLDIRECT 91.0 06/11/2020   LDLDIRECT 83.0 10/09/2019            He is being followed by gastroenterology for liver cirrhosis secondary to [facility]  Lab Results  Component Value Date   ALT 18 10/24/2021     He has symptomatic painful neuropathy and is on gabapentin 600 mg twice a day and  Cymbalta 60 mg  Has been followed by gastroenterologist for NASH  Lab Results  Component Value Date   ALT 18 10/24/2021      Review of Systems    Physical Examination:  BP 138/86   Pulse 67   Ht 5\' 11"  (1.803 m)   Wt 252 lb 9.6 oz (114.6 kg)   SpO2 98%   BMI 35.23 kg/m       ASSESSMENT:  Diabetes type 2 with obesity  See history  of present illness for discussion of current diabetes  management, blood sugar patterns and problems identified  Currently on a regimen of AMARYL, Ozempic 1 mg and Metformin 1000 mg a.m. and 500 mg p.m. daily  A1c is 5.9 compared to 6.5  His blood sugar patterns were reviewed from his freestyle [employer] and details as above  He has tendency to periodic blood sugar spikes with his diet being inconsistent or getting sweets or sweet drinks Although he is trying to be a little more active he is not doing much thanks aside Has gained 20 pounds  NASH with cirrhosis: Discussed the importance of weight loss and reduction in insulin resistance Also should benefit from regular use of Ozempic   PLAN: He will need to set up a reminder system to to remind himself to take his Ozempic weekly Suggested that he have his wife set up a reminder on her cell phone He will need to make sure he carries his Ozempic pen if he is not in town Consider reducing glimepiride if blood sugars continue to get low Stop drinking regular sweetened drinks and improve diet Consultation will be made for dietitian to review his meal planning  Consider switching to [facility] if weight loss not achieved   Patient Instructions  Set a reminder on phone to take weekly shot      Reather Littler 11/04/2021, 8:38 PM

## 2021-11-04 NOTE — Patient Instructions (Signed)
Set a reminder on phone to take weekly shot ?

## 2021-11-06 ENCOUNTER — Institutional Professional Consult (permissible substitution): Payer: 59 | Admitting: Plastic Surgery

## 2021-11-11 ENCOUNTER — Telehealth: Payer: Self-pay | Admitting: Plastic Surgery

## 2021-11-11 NOTE — Telephone Encounter (Signed)
Called and spoke with the patient regarding his message below.  Informed the patient that Dr. Claudia Desanctis is not in the office today,but he will be back in the office on tomorrow and we will get the order placed on tomorrow. Patient verbalized understanding and agreed.//AB/CMA

## 2021-11-11 NOTE — Telephone Encounter (Signed)
Pt is calling in to check on the status of the [place] that he thought was being ordered on 10/29/2021 for his lower back .  Pt would like to have a call back to let him know when he can get the [place] done. ?

## 2021-11-12 ENCOUNTER — Other Ambulatory Visit (HOSPITAL_COMMUNITY): Payer: Self-pay | Admitting: Plastic Surgery

## 2021-11-12 ENCOUNTER — Other Ambulatory Visit: Payer: Self-pay | Admitting: Plastic Surgery

## 2021-11-12 ENCOUNTER — Other Ambulatory Visit: Payer: Self-pay

## 2021-11-12 DIAGNOSIS — R222 Localized swelling, mass and lump, trunk: Secondary | ICD-10-CM

## 2021-11-13 NOTE — Telephone Encounter (Signed)
Called and spoke with the patient on (11/12/21) and informed him that I spoke with Dr. Claudia Desanctis and the order was done, but was not sure if the order was placed correctly.    Informed the patient that we reordered the US,but we wanted to make sure it did not need a PA done before he gets the [place].  The patient stated that he has had other testing and they have not needed a PA.    Informed the patient that the order will be sent in,and he will be getting a call regarding the appointment time.  Patient verbalized understanding and agreed.//AB/CMA

## 2021-11-19 ENCOUNTER — Other Ambulatory Visit: Payer: Self-pay | Admitting: Plastic Surgery

## 2021-11-19 ENCOUNTER — Other Ambulatory Visit: Payer: Self-pay | Admitting: Endocrinology

## 2021-11-19 ENCOUNTER — Other Ambulatory Visit (HOSPITAL_COMMUNITY): Payer: Self-pay

## 2021-11-19 ENCOUNTER — Other Ambulatory Visit: Payer: Self-pay

## 2021-11-19 ENCOUNTER — Ambulatory Visit (HOSPITAL_COMMUNITY)
Admission: RE | Admit: 2021-11-19 | Discharge: 2021-11-19 | Disposition: A | Discharge status: Home / Self Care | Payer: 59 | Source: Ambulatory Visit | Attending: Plastic Surgery | Admitting: Plastic Surgery

## 2021-11-19 ENCOUNTER — Other Ambulatory Visit (HOSPITAL_BASED_OUTPATIENT_CLINIC_OR_DEPARTMENT_OTHER): Payer: Self-pay

## 2021-11-19 DIAGNOSIS — R222 Localized swelling, mass and lump, trunk: Secondary | ICD-10-CM | POA: Insufficient documentation

## 2021-11-19 DIAGNOSIS — Z0389 Encounter for observation for other suspected diseases and conditions ruled out: Secondary | ICD-10-CM | POA: Diagnosis not present

## 2021-11-19 MED ORDER — GLIMEPIRIDE 4 MG PO TABS
ORAL_TABLET | Freq: Every day | ORAL | 3 refills | Status: DC
  Filled 2021-11-19: qty 90, 90d supply, fill #0

## 2021-11-20 ENCOUNTER — Other Ambulatory Visit (HOSPITAL_COMMUNITY): Payer: Self-pay

## 2021-11-20 ENCOUNTER — Other Ambulatory Visit (HOSPITAL_BASED_OUTPATIENT_CLINIC_OR_DEPARTMENT_OTHER): Payer: Self-pay

## 2021-11-20 MED ORDER — HUMIRA PEN 40 MG/0.8ML ~~LOC~~ PNKT: PEN_INJECTOR | SUBCUTANEOUS | 1 refills | Status: DC

## 2021-11-21 ENCOUNTER — Other Ambulatory Visit: Payer: Self-pay | Admitting: Pharmacist

## 2021-11-21 ENCOUNTER — Other Ambulatory Visit (HOSPITAL_COMMUNITY): Payer: Self-pay

## 2021-11-21 MED ORDER — HUMIRA PEN 40 MG/0.8ML ~~LOC~~ PNKT
PEN_INJECTOR | SUBCUTANEOUS | 1 refills | Status: DC
  Filled 2021-11-21: qty 2, 28d supply, fill #0

## 2021-11-25 ENCOUNTER — Other Ambulatory Visit (HOSPITAL_COMMUNITY): Payer: Self-pay

## 2021-11-26 ENCOUNTER — Telehealth: Payer: Self-pay | Admitting: *Deleted

## 2021-11-26 ENCOUNTER — Other Ambulatory Visit: Payer: Self-pay | Admitting: Plastic Surgery

## 2021-11-26 DIAGNOSIS — R222 Localized swelling, mass and lump, trunk: Secondary | ICD-10-CM

## 2021-11-26 NOTE — Telephone Encounter (Signed)
Pt called regarding [place] results (released in mychart) to find out what next step would be. Discussed with Dr Claudia Desanctis. Pt informed per Dr. Claudia Desanctis, based on [place] results ("No evidence of mass or focal fluid collection in region of reported palpable mass in right lower back"), the next step would be to have an MRI to evaluate the area further. Pt states he would like to proceed and can go to either [facility] or [facility] for the MRI. Dr. Claudia Desanctis made aware.

## 2021-11-28 ENCOUNTER — Other Ambulatory Visit (HOSPITAL_BASED_OUTPATIENT_CLINIC_OR_DEPARTMENT_OTHER): Payer: Self-pay

## 2021-12-03 ENCOUNTER — Encounter: Payer: Self-pay | Admitting: Internal Medicine

## 2021-12-05 ENCOUNTER — Other Ambulatory Visit (HOSPITAL_COMMUNITY): Payer: Self-pay

## 2021-12-05 DIAGNOSIS — Z79899 Other long term (current) drug therapy: Secondary | ICD-10-CM | POA: Diagnosis not present

## 2021-12-05 DIAGNOSIS — L409 Psoriasis, unspecified: Secondary | ICD-10-CM | POA: Diagnosis not present

## 2021-12-05 MED ORDER — HUMIRA PEN 40 MG/0.8ML ~~LOC~~ PNKT: PEN_INJECTOR | SUBCUTANEOUS | 5 refills | Status: DC

## 2021-12-08 ENCOUNTER — Other Ambulatory Visit (HOSPITAL_COMMUNITY): Payer: Self-pay

## 2021-12-08 ENCOUNTER — Other Ambulatory Visit: Payer: Self-pay | Admitting: Pharmacist

## 2021-12-08 MED ORDER — HUMIRA PEN 40 MG/0.8ML ~~LOC~~ PNKT
PEN_INJECTOR | SUBCUTANEOUS | 5 refills | Status: DC
  Filled 2021-12-08: qty 2, fill #0
  Filled 2021-12-15: qty 2, 28d supply, fill #0
  Filled 2022-01-13: qty 2, 28d supply, fill #1
  Filled 2022-02-09: qty 2, 28d supply, fill #2
  Filled 2022-03-09: qty 2, 28d supply, fill #3
  Filled 2022-04-01: qty 2, 28d supply, fill #4
  Filled 2022-04-27: qty 2, 28d supply, fill #5

## 2021-12-13 ENCOUNTER — Ambulatory Visit (HOSPITAL_COMMUNITY)
Admission: RE | Admit: 2021-12-13 | Discharge: 2021-12-13 | Disposition: A | Discharge status: Home / Self Care | Payer: 59 | Source: Ambulatory Visit | Attending: Plastic Surgery | Admitting: Plastic Surgery

## 2021-12-13 DIAGNOSIS — R222 Localized swelling, mass and lump, trunk: Secondary | ICD-10-CM | POA: Diagnosis not present

## 2021-12-13 DIAGNOSIS — M4696 Unspecified inflammatory spondylopathy, lumbar region: Secondary | ICD-10-CM | POA: Diagnosis not present

## 2021-12-13 DIAGNOSIS — M4326 Fusion of spine, lumbar region: Secondary | ICD-10-CM | POA: Diagnosis not present

## 2021-12-13 DIAGNOSIS — M5126 Other intervertebral disc displacement, lumbar region: Secondary | ICD-10-CM | POA: Diagnosis not present

## 2021-12-13 DIAGNOSIS — M47816 Spondylosis without myelopathy or radiculopathy, lumbar region: Secondary | ICD-10-CM | POA: Diagnosis not present

## 2021-12-13 MED ORDER — GADOBUTROL 1 MMOL/ML IV SOLN
10.0000 mL | Freq: Once | INTRAVENOUS | Status: AC | PRN
  Administered 2021-12-13: 10 mL via INTRAVENOUS

## 2021-12-15 ENCOUNTER — Other Ambulatory Visit (HOSPITAL_COMMUNITY): Payer: Self-pay

## 2021-12-15 ENCOUNTER — Other Ambulatory Visit: Payer: Self-pay | Admitting: Cardiology

## 2021-12-15 ENCOUNTER — Other Ambulatory Visit: Payer: Self-pay | Admitting: Internal Medicine

## 2021-12-15 ENCOUNTER — Other Ambulatory Visit: Payer: Self-pay | Admitting: Neurology

## 2021-12-15 ENCOUNTER — Other Ambulatory Visit (HOSPITAL_BASED_OUTPATIENT_CLINIC_OR_DEPARTMENT_OTHER): Payer: Self-pay

## 2021-12-15 ENCOUNTER — Ambulatory Visit: Payer: 59 | Admitting: Internal Medicine

## 2021-12-15 ENCOUNTER — Other Ambulatory Visit: Payer: Self-pay | Admitting: Endocrinology

## 2021-12-15 ENCOUNTER — Other Ambulatory Visit: Payer: Self-pay

## 2021-12-15 DIAGNOSIS — E1165 Type 2 diabetes mellitus with hyperglycemia: Secondary | ICD-10-CM

## 2021-12-15 MED ORDER — PANTOPRAZOLE SODIUM 40 MG PO TBEC
40.0000 mg | DELAYED_RELEASE_TABLET | Freq: Every day | ORAL | 1 refills | Status: DC
  Filled 2021-12-15: qty 90, 90d supply, fill #0

## 2021-12-15 MED ORDER — AMLODIPINE BESYLATE 5 MG PO TABS
5.0000 mg | ORAL_TABLET | Freq: Every day | ORAL | 3 refills | Status: DC
  Filled 2021-12-15: qty 90, 90d supply, fill #0
  Filled 2022-03-23: qty 90, 90d supply, fill #1
  Filled 2022-06-18: qty 90, 90d supply, fill #2
  Filled 2022-09-29: qty 90, 90d supply, fill #3

## 2021-12-16 ENCOUNTER — Other Ambulatory Visit (HOSPITAL_BASED_OUTPATIENT_CLINIC_OR_DEPARTMENT_OTHER): Payer: Self-pay

## 2021-12-16 ENCOUNTER — Other Ambulatory Visit: Payer: Self-pay | Admitting: Endocrinology

## 2021-12-16 DIAGNOSIS — E1165 Type 2 diabetes mellitus with hyperglycemia: Secondary | ICD-10-CM

## 2021-12-16 MED ORDER — FREESTYLE LIBRE 14 DAY SENSOR MISC
1.0000 [IU] | 2 refills | Status: DC
  Filled 2021-12-16: qty 2, 28d supply, fill #0
  Filled 2022-01-20 (×2): qty 2, 28d supply, fill #1
  Filled 2022-02-20: qty 2, 28d supply, fill #2

## 2021-12-16 MED ORDER — CLOPIDOGREL BISULFATE 75 MG PO TABS
75.0000 mg | ORAL_TABLET | Freq: Every day | ORAL | 3 refills | Status: DC
  Filled 2021-12-16: qty 90, 90d supply, fill #0
  Filled 2022-02-26 – 2022-03-23 (×2): qty 90, 90d supply, fill #1
  Filled 2022-06-18: qty 90, 90d supply, fill #2
  Filled 2022-09-29: qty 90, 90d supply, fill #3

## 2021-12-16 MED ORDER — CLONAZEPAM 0.5 MG PO TABS
ORAL_TABLET | Freq: Every day | ORAL | 5 refills | Status: DC
  Filled 2021-12-16: qty 30, fill #0
  Filled 2021-12-18: qty 30, 30d supply, fill #0
  Filled 2022-01-30: qty 30, 30d supply, fill #1
  Filled 2022-02-26: qty 30, 30d supply, fill #2
  Filled 2022-04-06: qty 30, 30d supply, fill #3
  Filled 2022-05-04: qty 30, 30d supply, fill #4
  Filled 2022-06-04: qty 30, 30d supply, fill #5

## 2021-12-16 NOTE — Telephone Encounter (Signed)
Patient is up to date on his appointments. Patient is due for a refill on his klonopin. [place] Controlled Substance Registry checked and is appropriate.

## 2021-12-17 ENCOUNTER — Other Ambulatory Visit (HOSPITAL_BASED_OUTPATIENT_CLINIC_OR_DEPARTMENT_OTHER): Payer: Self-pay

## 2021-12-18 ENCOUNTER — Other Ambulatory Visit (HOSPITAL_BASED_OUTPATIENT_CLINIC_OR_DEPARTMENT_OTHER): Payer: Self-pay

## 2021-12-19 DIAGNOSIS — Z982 Presence of cerebrospinal fluid drainage device: Secondary | ICD-10-CM | POA: Diagnosis not present

## 2021-12-22 ENCOUNTER — Ambulatory Visit (INDEPENDENT_AMBULATORY_CARE_PROVIDER_SITE_OTHER): Payer: 59 | Admitting: Cardiology

## 2021-12-22 ENCOUNTER — Encounter: Payer: Self-pay | Admitting: Cardiology

## 2021-12-22 ENCOUNTER — Other Ambulatory Visit (HOSPITAL_COMMUNITY): Payer: Self-pay

## 2021-12-22 VITALS — BP 112/74 | HR 69 | Ht 71.0 in | Wt 254.4 lb

## 2021-12-22 DIAGNOSIS — K7581 Nonalcoholic steatohepatitis (NASH): Secondary | ICD-10-CM

## 2021-12-22 DIAGNOSIS — R0789 Other chest pain: Secondary | ICD-10-CM | POA: Diagnosis not present

## 2021-12-22 DIAGNOSIS — I10 Essential (primary) hypertension: Secondary | ICD-10-CM

## 2021-12-22 DIAGNOSIS — I2510 Atherosclerotic heart disease of native coronary artery without angina pectoris: Secondary | ICD-10-CM | POA: Diagnosis not present

## 2021-12-22 NOTE — Progress Notes (Signed)
Cardiology Office Note:    Date:  12/22/2021   ID:  James [name], DOB Oct 16, 1955, MRN [id number]  PCP:  Colon Branch, MD  Cardiologist:  Jenne Campus, MD    Referring MD: Colon Branch, MD   Chief Complaint  Patient presents with   Follow-up  Doing well  History of Present Illness:    James [name] is a 66 y.o. male   with past medical history significant for coronary artery disease status post PTCA of the LAD in 2022, diabetes, essential hypertension, dyslipidemia, nonalcoholic cirrhosis of the liver.  He was admitted recently to the hospital on 17 February because of intermittent chest pain.  Cardiac catheterization has been performed cardiac catheterization showed unchanged lesion compared to prior cardiac catheterization he does have about 75% lesion on the circumflex artery.  He also got multiple residual 30 to 50% lesions. Comes today to my office in follow-up overall doing well.  Denies have any chest pain tightness squeezing pressure burning chest no palpitations dizziness swelling of lower extremities rare episode of chest pain not related to exercise  Past Medical History:  Diagnosis Date   Abnormal cardiac CT angiography    Acid reflux    Annual physical exam 04/08/2015   Arthritis    Atypical chest pain 06/13/2020   Blood transfusion without reported diagnosis    Body mass index (BMI) 35.0-35.9, adult 04/05/2019   Chronic fatigue 01/28/2015   Chronic headaches    on cymbalta   Chronic migraine w/o aura, not intractable, w/o stat migr 10/24/2018   Cirrhosis ([place])    Colon polyps    Complication of anesthesia    problems waking up from anesthesia   Coronary artery disease 01/23/2019   Depression    on cymbalta   Diabetes mellitus with neuropathy ([place])    Diabetes with neuropathy 04/25/2013   Diverticulitis 03/2013   Dyslipidemia 05/29/2019   Eczema    Elevated LFTs    Epidural lipomatosis 10/05/2018   Essential (primary) hypertension 04/05/2019   Essential  hypertension 10/10/2019   Fatty liver    GERD (gastroesophageal reflux disease) 04/28/2011   H/O craniotomy 05/07/2015   Headache 04/28/2011   Hepatitis 10/2017   NASH cirrhosis   History of kidney stones    Hyperlipidemia    Hypersomnia with sleep apnea 01/28/2015   Hypertension    IDA (iron deficiency anemia) 01/24/2019   Idiopathic intracranial hypertension 01/14/2017   Insomnia 04/26/2013   Kidney stone    Liver cirrhosis secondary to NASH (nonalcoholic steatohepatitis) ([place]) 01/02/2016   Low back pain 04/05/2019   Lower back injury 08/14/2019   Morbid obesity ([place])    Neuromuscular disorder ([place])    neuropathy   Neuropathy    Nonalcoholic steatohepatitis 0/02/4708   Obstructive hydrocephalus ([place]) 01/28/2015   OSA -- dx ~ 2012, cpap intolerant 09/04/2014    dx ~ 2012, cpap intolerant    PCP NOTES >>> 04/08/2015   Post-op pain 03/19/2019   Post-traumatic hydrocephalus ([place])    s/p shunts x 2 (first got infected )   Presence of cerebrospinal fluid drainage device 07/28/2011   Psoriasis    sees Dr [name]   Psoriatic arthritis ([place])    REM behavioral disorder 01/14/2017   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   see's Dr.Ortmann   Severe obesity (BMI >= 40) ([place]) 01/28/2015   SI (sacroiliac) joint dysfunction 08/14/2019   Sigmoid diverticulitis 04/25/2013   Sleep apnea    no CPAP  Spondylolisthesis, lumbar region 03/16/2019   Stomach ulcer    Testosterone deficiency 04/28/2011   VP (ventriculoperitoneal) shunt status 07/31/2020    Past Surgical History:  Procedure Laterality Date   BACK SURGERY  1980   BRAIN SURGERY     VP shunts placed in 2007   CHOLECYSTECTOMY N/A 08/25/2017   Procedure: LAPAROSCOPIC CHOLECYSTECTOMY WITH INTRAOPERATIVE CHOLANGIOGRAM;  Surgeon: [name], MD;  Location: [facility];  Service: General;  Laterality: N/A;   COLONOSCOPY     CORONARY STENT INTERVENTION N/A 09/27/2020   Procedure: CORONARY STENT INTERVENTION;  Surgeon: Jettie Booze, MD;  Location: [facility];  Service: Cardiovascular;  Laterality: N/A;   INTRAVASCULAR ULTRASOUND/IVUS N/A 09/27/2020   Procedure: Intravascular Ultrasound/IVUS;  Surgeon: Jettie Booze, MD;  Location: [facility];  Service: Cardiovascular;  Laterality: N/A;   JOINT REPLACEMENT     total hip   LEFT HEART CATH N/A 09/27/2020   Procedure: Left Heart Cath;  Surgeon: Jettie Booze, MD;  Location: [facility];  Service: Cardiovascular;  Laterality: N/A;   LEFT HEART CATH AND CORONARY ANGIOGRAPHY N/A 09/24/2020   Procedure: LEFT HEART CATH AND CORONARY ANGIOGRAPHY;  Surgeon: Jettie Booze, MD;  Location: [facility];  Service: Cardiovascular;  Laterality: N/A;   LEFT HEART CATH AND CORONARY ANGIOGRAPHY N/A 08/18/2021   Procedure: LEFT HEART CATH AND CORONARY ANGIOGRAPHY;  Surgeon: Troy Sine, MD;  Location: [facility];  Service: Cardiovascular;  Laterality: N/A;   LUMBAR FUSION  03/16/2019   SHOULDER SURGERY Left 2010   TOE SURGERY Left 2018   TONSILLECTOMY     as a child   TOTAL HIP ARTHROPLASTY Left 2011   UPPER GASTROINTESTINAL ENDOSCOPY  01/04/2020   VENTRICULOPERITONEAL SHUNT  2007   x2    Current Medications: Current Meds  Medication Sig   acetaminophen (TYLENOL) 325 MG tablet Take 2 tablets (650 mg total) by mouth every 4 (four) hours as needed for headache or mild pain.   Adalimumab (HUMIRA PEN) 40 MG/0.8ML PNKT INJECT 40 MG (0.8 ML) UNDER THE SKIN EVERY OTHER WEEK.   amLODipine (NORVASC) 5 MG tablet Take 1 tablet (5 mg total) by mouth daily.   amoxicillin-clavulanate (AUGMENTIN) 875-125 MG tablet Take 1 tablet by mouth 2 (two) times daily.   aspirin 81 MG chewable tablet Chew 1 tablet (81 mg total) by mouth daily.   atorvastatin (LIPITOR) 80 MG tablet Take 1 tablet (80 mg total) by mouth at bedtime.   carvedilol (COREG) 12.5 MG tablet TAKE 1 TABLET (12.5 MG TOTAL) BY MOUTH 2 (TWO) TIMES DAILY WITH A MEAL. (Patient taking  differently: Take 12.5 mg by mouth 2 (two) times daily with a meal.)   clonazePAM (KLONOPIN) 0.5 MG tablet TAKE 1 TABLET BY MOUTH EVERY NIGHT AT BEDTIME   clopidogrel (PLAVIX) 75 MG tablet TAKE 1 TABLET (75 MG TOTAL) BY MOUTH DAILY.   Continuous Blood Gluc Sensor (FREESTYLE LIBRE 14 DAY SENSOR) MISC Apply 1 sensor every 14 (fourteen) days.   DULoxetine (CYMBALTA) 60 MG capsule TAKE 2 CAPSULES (120 MG TOTAL) BY MOUTH DAILY. (Patient taking differently: Take 60 mg by mouth daily.)   EPINEPHrine (EPIPEN 2-PAK) 0.3 mg/0.3 mL IJ SOAJ injection Inject 0.3 mLs (0.3 mg total) into the muscle once as needed for up to 1 dose for anaphylaxis.   fenofibrate micronized (LOFIBRA) 134 MG capsule TAKE 1 CAPSULE BY MOUTH THREE TIMES WEEKLY (Patient taking differently: Take 134 mg by mouth daily  before breakfast.)   gabapentin (NEURONTIN) 600 MG tablet TAKE 1 TABLET (600 MG TOTAL) BY MOUTH 2 (TWO) TIMES DAILY. (Patient taking differently: Take 600 mg by mouth 2 (two) times daily.)   isosorbide mononitrate (IMDUR) 60 MG 24 hr tablet Take 1 tablet (60 mg total) by mouth daily.   metFORMIN (GLUCOPHAGE) 500 MG tablet Take 2 tablets (1,000 mg total) by mouth 2 (two) times daily.   nitroGLYCERIN (NITROSTAT) 0.4 MG SL tablet Place 1 tablet (0.4 mg total) under the tongue every 5 (five) minutes for 3 doses as needed for chest pain.   pantoprazole (PROTONIX) 40 MG tablet Take 1 tablet (40 mg total) by mouth daily.   Semaglutide, 2 MG/DOSE, (OZEMPIC, 2 MG/DOSE,) 8 MG/3ML SOPN Inject 2 mg into the skin weekly (Patient taking differently: Inject 2 mg into the skin once a week. Inject 2 mg into the skin weekly)     Allergies:   Bee venom, Hydrocodone bit-homatrop mbr, Toradol [ketorolac tromethamine], Sulfadiazine, Morphine and related, and Sulfa drugs cross reactors   Social History   Socioeconomic History   Marital status: Married    Spouse name: [name]   Number of children: 2   Years of education: Not on file   Highest  education level: Not on file  Occupational History   Occupation: disabled   Tobacco Use   Smoking status: Never   Smokeless tobacco: Never  Vaping Use   Vaping Use: Never used  Substance and Sexual Activity   Alcohol use: No   Drug use: No   Sexual activity: Yes    Partners: Female  Other Topics Concern   Not on file  Social History Narrative   Household-- pt , wife, one adult son with Down's syndrome   younger son lives in [place] 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   Social Determinants of Health   Financial Resource Strain: Low Risk  (03/18/2021)   Overall Financial Resource Strain (CARDIA)    Difficulty of Paying Living Expenses: Not hard at all  Food Insecurity: No Food Insecurity (03/18/2021)   Hunger Vital Sign    Worried About Running Out of Food in the Last Year: Never true    Ran Out of Food in the Last Year: Never true  Transportation Needs: No Transportation Needs (03/18/2021)   PRAPARE - Hydrologist (Medical): No    Lack of Transportation (Non-Medical): No  Physical Activity: Inactive (03/18/2021)   Exercise Vital Sign    Days of Exercise per Week: 0 days    Minutes of Exercise per Session: 0 min  Stress: No Stress Concern Present (03/18/2021)   [place]    Feeling of Stress : Not at all  Social Connections: Moderately Integrated (03/18/2021)   Social Connection and Isolation Panel [NHANES]    Frequency of Communication with Friends and Family: More than three times a week    Frequency of Social Gatherings with Friends and Family: Once a week    Attends Religious Services: Never    Marine scientist or Organizations: Yes    Attends Music therapist: 1 to 4 times per year    Marital Status: Married     Family History: The patient's family history includes Down syndrome in his son; Heart disease in his  brother; Lung cancer in his father; Other in his brother and mother. There is no history of Diabetes,  Prostate cancer, Colon cancer, Stomach cancer, Pancreatic cancer, or Liver disease. ROS:   Please see the history of present illness.    All 14 point review of systems negative except as described per history of present illness  EKGs/Labs/Other Studies Reviewed:      Recent Labs: 06/26/2021: TSH 1.22 10/24/2021: ALT 18; Hemoglobin 13.1; Platelets 148.0 10/30/2021: BUN 21; Creatinine, Ser 1.41; Potassium 4.3; Sodium 138  Recent Lipid Panel    Component Value Date/Time   CHOL 118 08/16/2021 0401   CHOL 152 10/30/2020 0920   TRIG 101 08/16/2021 0401   HDL 43 08/16/2021 0401   HDL 39 (L) 10/30/2020 0920   CHOLHDL 2.7 08/16/2021 0401   VLDL 20 08/16/2021 0401   LDLCALC 55 08/16/2021 0401   LDLCALC 68 10/30/2020 0920   LDLDIRECT 67.0 12/31/2020 0936    Physical Exam:    VS:  BP 112/74 (BP Location: Right Arm, Patient Position: Sitting)   Pulse 69   Ht 5' 11"  (1.803 m)   Wt 254 lb 6.4 oz (115.4 kg)   SpO2 96%   BMI 35.48 kg/m     Wt Readings from Last 3 Encounters:  12/22/21 254 lb 6.4 oz (115.4 kg)  11/04/21 252 lb 9.6 oz (114.6 kg)  10/29/21 248 lb 6.4 oz (112.7 kg)     GEN:  Well nourished, well developed in no acute distress HEENT: Normal NECK: No JVD; No carotid bruits LYMPHATICS: No lymphadenopathy CARDIAC: RRR, no murmurs, no rubs, no gallops RESPIRATORY:  Clear to auscultation without rales, wheezing or rhonchi  ABDOMEN: Soft, non-tender, non-distended MUSCULOSKELETAL:  No edema; No deformity  SKIN: Warm and dry LOWER EXTREMITIES: no swelling NEUROLOGIC:  Alert and oriented x 3 PSYCHIATRIC:  Normal affect   ASSESSMENT:    1. Coronary artery disease involving native coronary artery of native heart without angina pectoris   2. Essential (primary) hypertension   3. Nonalcoholic steatohepatitis   4. Atypical chest pain    PLAN:    In order of problems  listed above:  Coronary artery disease, stable from that point review.  On appropriate medications.  That include antiplatelets therapy as well as statin.  Symptomatology is atypical.  No new worsening symptoms ask him to let me know if he start having more chest pain tightness squeezing pressure burning chest in the meantime we will consider risk factors modifications Essential hypertension blood pressure perfectly controlled 112/74 we will continue present management Dyslipidemia I did review his K PN which show me data from March February of this year with LDL of 55 HDL 43 good cholesterol control continue present management. Diabetes.  Hemoglobin A1c 5.9 this is from May of this year.  Continue present management. Liver steatosis.  Followed by internal medicine team   Medication Adjustments/Labs and Tests Ordered: Current medicines are reviewed at length with the patient today.  Concerns regarding medicines are outlined above.  No orders of the defined types were placed in this encounter.  Medication changes: No orders of the defined types were placed in this encounter.   Signed, Park Liter, MD, [facility] 12/22/2021 [place]

## 2021-12-25 ENCOUNTER — Inpatient Hospital Stay (HOSPITAL_BASED_OUTPATIENT_CLINIC_OR_DEPARTMENT_OTHER)
Admission: EM | Admit: 2021-12-25 | Discharge: 2022-01-01 | DRG: 854 | Disposition: A | Discharge status: Home / Self Care | Payer: 59 | Attending: Family Medicine | Admitting: Family Medicine

## 2021-12-25 ENCOUNTER — Other Ambulatory Visit: Payer: Self-pay

## 2021-12-25 ENCOUNTER — Encounter (HOSPITAL_BASED_OUTPATIENT_CLINIC_OR_DEPARTMENT_OTHER): Payer: Self-pay | Admitting: Urology

## 2021-12-25 ENCOUNTER — Encounter: Payer: Self-pay | Admitting: Internal Medicine

## 2021-12-25 ENCOUNTER — Emergency Department (HOSPITAL_BASED_OUTPATIENT_CLINIC_OR_DEPARTMENT_OTHER): Payer: 59

## 2021-12-25 DIAGNOSIS — Z982 Presence of cerebrospinal fluid drainage device: Secondary | ICD-10-CM | POA: Diagnosis not present

## 2021-12-25 DIAGNOSIS — N1831 Chronic kidney disease, stage 3a: Secondary | ICD-10-CM | POA: Diagnosis not present

## 2021-12-25 DIAGNOSIS — Z6834 Body mass index (BMI) 34.0-34.9, adult: Secondary | ICD-10-CM

## 2021-12-25 DIAGNOSIS — E1140 Type 2 diabetes mellitus with diabetic neuropathy, unspecified: Secondary | ICD-10-CM | POA: Diagnosis present

## 2021-12-25 DIAGNOSIS — I129 Hypertensive chronic kidney disease with stage 1 through stage 4 chronic kidney disease, or unspecified chronic kidney disease: Secondary | ICD-10-CM | POA: Diagnosis present

## 2021-12-25 DIAGNOSIS — E11628 Type 2 diabetes mellitus with other skin complications: Secondary | ICD-10-CM | POA: Diagnosis present

## 2021-12-25 DIAGNOSIS — M069 Rheumatoid arthritis, unspecified: Secondary | ICD-10-CM | POA: Diagnosis present

## 2021-12-25 DIAGNOSIS — M86672 Other chronic osteomyelitis, left ankle and foot: Secondary | ICD-10-CM | POA: Diagnosis not present

## 2021-12-25 DIAGNOSIS — R7881 Bacteremia: Secondary | ICD-10-CM | POA: Diagnosis not present

## 2021-12-25 DIAGNOSIS — G4733 Obstructive sleep apnea (adult) (pediatric): Secondary | ICD-10-CM | POA: Diagnosis present

## 2021-12-25 DIAGNOSIS — L089 Local infection of the skin and subcutaneous tissue, unspecified: Secondary | ICD-10-CM | POA: Diagnosis present

## 2021-12-25 DIAGNOSIS — B9561 Methicillin susceptible Staphylococcus aureus infection as the cause of diseases classified elsewhere: Secondary | ICD-10-CM

## 2021-12-25 DIAGNOSIS — E785 Hyperlipidemia, unspecified: Secondary | ICD-10-CM | POA: Diagnosis present

## 2021-12-25 DIAGNOSIS — M869 Osteomyelitis, unspecified: Secondary | ICD-10-CM

## 2021-12-25 DIAGNOSIS — M19072 Primary osteoarthritis, left ankle and foot: Secondary | ICD-10-CM | POA: Diagnosis not present

## 2021-12-25 DIAGNOSIS — I081 Rheumatic disorders of both mitral and tricuspid valves: Secondary | ICD-10-CM | POA: Diagnosis not present

## 2021-12-25 DIAGNOSIS — Z7982 Long term (current) use of aspirin: Secondary | ICD-10-CM

## 2021-12-25 DIAGNOSIS — Z7902 Long term (current) use of antithrombotics/antiplatelets: Secondary | ICD-10-CM

## 2021-12-25 DIAGNOSIS — M65872 Other synovitis and tenosynovitis, left ankle and foot: Secondary | ICD-10-CM | POA: Diagnosis not present

## 2021-12-25 DIAGNOSIS — M7989 Other specified soft tissue disorders: Secondary | ICD-10-CM | POA: Diagnosis not present

## 2021-12-25 DIAGNOSIS — Z7984 Long term (current) use of oral hypoglycemic drugs: Secondary | ICD-10-CM

## 2021-12-25 DIAGNOSIS — Z91030 Bee allergy status: Secondary | ICD-10-CM

## 2021-12-25 DIAGNOSIS — L4050 Arthropathic psoriasis, unspecified: Secondary | ICD-10-CM | POA: Diagnosis present

## 2021-12-25 DIAGNOSIS — N182 Chronic kidney disease, stage 2 (mild): Secondary | ICD-10-CM | POA: Diagnosis not present

## 2021-12-25 DIAGNOSIS — Z79620 Long term (current) use of immunosuppressive biologic: Secondary | ICD-10-CM

## 2021-12-25 DIAGNOSIS — E119 Type 2 diabetes mellitus without complications: Secondary | ICD-10-CM | POA: Diagnosis not present

## 2021-12-25 DIAGNOSIS — I10 Essential (primary) hypertension: Secondary | ICD-10-CM | POA: Diagnosis present

## 2021-12-25 DIAGNOSIS — G4730 Sleep apnea, unspecified: Secondary | ICD-10-CM | POA: Diagnosis not present

## 2021-12-25 DIAGNOSIS — A4101 Sepsis due to Methicillin susceptible Staphylococcus aureus: Principal | ICD-10-CM | POA: Diagnosis present

## 2021-12-25 DIAGNOSIS — Z955 Presence of coronary angioplasty implant and graft: Secondary | ICD-10-CM | POA: Diagnosis not present

## 2021-12-25 DIAGNOSIS — Z79899 Other long term (current) drug therapy: Secondary | ICD-10-CM

## 2021-12-25 DIAGNOSIS — R600 Localized edema: Secondary | ICD-10-CM | POA: Diagnosis not present

## 2021-12-25 DIAGNOSIS — Z96642 Presence of left artificial hip joint: Secondary | ICD-10-CM | POA: Diagnosis not present

## 2021-12-25 DIAGNOSIS — E1169 Type 2 diabetes mellitus with other specified complication: Secondary | ICD-10-CM | POA: Diagnosis present

## 2021-12-25 DIAGNOSIS — K7460 Unspecified cirrhosis of liver: Secondary | ICD-10-CM | POA: Diagnosis present

## 2021-12-25 DIAGNOSIS — Z801 Family history of malignant neoplasm of trachea, bronchus and lung: Secondary | ICD-10-CM

## 2021-12-25 DIAGNOSIS — Z885 Allergy status to narcotic agent status: Secondary | ICD-10-CM

## 2021-12-25 DIAGNOSIS — N179 Acute kidney failure, unspecified: Secondary | ICD-10-CM | POA: Diagnosis present

## 2021-12-25 DIAGNOSIS — E1122 Type 2 diabetes mellitus with diabetic chronic kidney disease: Secondary | ICD-10-CM | POA: Diagnosis not present

## 2021-12-25 DIAGNOSIS — I2510 Atherosclerotic heart disease of native coronary artery without angina pectoris: Secondary | ICD-10-CM | POA: Diagnosis not present

## 2021-12-25 DIAGNOSIS — M25472 Effusion, left ankle: Secondary | ICD-10-CM | POA: Diagnosis not present

## 2021-12-25 DIAGNOSIS — Z794 Long term (current) use of insulin: Secondary | ICD-10-CM

## 2021-12-25 DIAGNOSIS — Z872 Personal history of diseases of the skin and subcutaneous tissue: Secondary | ICD-10-CM | POA: Diagnosis not present

## 2021-12-25 DIAGNOSIS — N1832 Chronic kidney disease, stage 3b: Secondary | ICD-10-CM | POA: Diagnosis present

## 2021-12-25 DIAGNOSIS — Z8279 Family history of other congenital malformations, deformations and chromosomal abnormalities: Secondary | ICD-10-CM

## 2021-12-25 DIAGNOSIS — L0390 Cellulitis, unspecified: Secondary | ICD-10-CM | POA: Diagnosis not present

## 2021-12-25 DIAGNOSIS — E11621 Type 2 diabetes mellitus with foot ulcer: Secondary | ICD-10-CM | POA: Diagnosis not present

## 2021-12-25 DIAGNOSIS — M86072 Acute hematogenous osteomyelitis, left ankle and foot: Secondary | ICD-10-CM | POA: Diagnosis not present

## 2021-12-25 DIAGNOSIS — M1990 Unspecified osteoarthritis, unspecified site: Secondary | ICD-10-CM | POA: Diagnosis present

## 2021-12-25 DIAGNOSIS — Z882 Allergy status to sulfonamides status: Secondary | ICD-10-CM

## 2021-12-25 DIAGNOSIS — Z8249 Family history of ischemic heart disease and other diseases of the circulatory system: Secondary | ICD-10-CM

## 2021-12-25 DIAGNOSIS — S92422A Displaced fracture of distal phalanx of left great toe, initial encounter for closed fracture: Secondary | ICD-10-CM | POA: Diagnosis not present

## 2021-12-25 DIAGNOSIS — I340 Nonrheumatic mitral (valve) insufficiency: Secondary | ICD-10-CM | POA: Diagnosis not present

## 2021-12-25 DIAGNOSIS — M86172 Other acute osteomyelitis, left ankle and foot: Secondary | ICD-10-CM | POA: Diagnosis not present

## 2021-12-25 DIAGNOSIS — R6520 Severe sepsis without septic shock: Secondary | ICD-10-CM | POA: Diagnosis not present

## 2021-12-25 LAB — CBC WITH DIFFERENTIAL/PLATELET
Abs Immature Granulocytes: 0.04 10*3/uL (ref 0.00–0.07)
Basophils Absolute: 0 10*3/uL (ref 0.0–0.1)
Basophils Relative: 0 %
Eosinophils Absolute: 0.1 10*3/uL (ref 0.0–0.5)
Eosinophils Relative: 1 %
HCT: 41.3 % (ref 39.0–52.0)
Hemoglobin: 14.2 g/dL (ref 13.0–17.0)
Immature Granulocytes: 0 %
Lymphocytes Relative: 14 %
Lymphs Abs: 1.4 10*3/uL (ref 0.7–4.0)
MCH: 29.9 pg (ref 26.0–34.0)
MCHC: 34.4 g/dL (ref 30.0–36.0)
MCV: 86.9 fL (ref 80.0–100.0)
Monocytes Absolute: 1 10*3/uL (ref 0.1–1.0)
Monocytes Relative: 10 %
Neutro Abs: 7.4 10*3/uL (ref 1.7–7.7)
Neutrophils Relative %: 75 %
Platelets: 159 10*3/uL (ref 150–400)
RBC: 4.75 MIL/uL (ref 4.22–5.81)
RDW: 15.1 % (ref 11.5–15.5)
WBC: 10 10*3/uL (ref 4.0–10.5)
nRBC: 0 % (ref 0.0–0.2)

## 2021-12-25 LAB — SEDIMENTATION RATE: Sed Rate: 31 mm/hr — ABNORMAL HIGH (ref 0–16)

## 2021-12-25 LAB — BASIC METABOLIC PANEL
Anion gap: 7 (ref 5–15)
BUN: 25 mg/dL — ABNORMAL HIGH (ref 8–23)
CO2: 23 mmol/L (ref 22–32)
Calcium: 9.7 mg/dL (ref 8.9–10.3)
Chloride: 105 mmol/L (ref 98–111)
Creatinine, Ser: 1.8 mg/dL — ABNORMAL HIGH (ref 0.61–1.24)
GFR, Estimated: 42 mL/min — ABNORMAL LOW (ref >60)
Glucose, Bld: 176 mg/dL — ABNORMAL HIGH (ref 70–99)
Potassium: 4.4 mmol/L (ref 3.5–5.1)
Sodium: 135 mmol/L (ref 135–145)

## 2021-12-25 LAB — LACTIC ACID, PLASMA: Lactic Acid, Venous: 2 mmol/L (ref 0.5–1.9)

## 2021-12-25 LAB — C-REACTIVE PROTEIN: CRP: 1.7 mg/dL — ABNORMAL HIGH (ref <1.0)

## 2021-12-25 MED ORDER — OXYCODONE-ACETAMINOPHEN 5-325 MG PO TABS
1.0000 | ORAL_TABLET | Freq: Once | ORAL | Status: AC
  Administered 2021-12-25: 1 via ORAL
  Filled 2021-12-25: qty 1

## 2021-12-25 MED ORDER — METRONIDAZOLE 500 MG/100ML IV SOLN
500.0000 mg | Freq: Two times a day (BID) | INTRAVENOUS | Status: DC
  Administered 2021-12-25 – 2021-12-26 (×2): 500 mg via INTRAVENOUS
  Filled 2021-12-25 (×2): qty 100

## 2021-12-25 MED ORDER — SODIUM CHLORIDE 0.9 % IV BOLUS
500.0000 mL | Freq: Once | INTRAVENOUS | Status: AC
  Administered 2021-12-25: 500 mL via INTRAVENOUS

## 2021-12-25 MED ORDER — SODIUM CHLORIDE 0.9 % IV SOLN: 2.0000 g | Freq: Two times a day (BID) | INTRAVENOUS | Status: DC

## 2021-12-25 MED ORDER — SODIUM CHLORIDE 0.9 % IV SOLN
2.0000 g | Freq: Two times a day (BID) | INTRAVENOUS | Status: DC
  Administered 2021-12-25 – 2021-12-26 (×2): 2 g via INTRAVENOUS
  Filled 2021-12-25 (×2): qty 12.5

## 2021-12-25 MED ORDER — SODIUM CHLORIDE 0.9 % IV SOLN: INTRAVENOUS | Status: DC | PRN

## 2021-12-25 NOTE — Progress Notes (Signed)
Pharmacy Antibiotic Note  James [name] is a 66 y.o. male for which pharmacy has been consulted for cefepime dosing for  suspected diabetic foot infection .  Patient with a history of CAD s/p PTCA of the LAD, DM, HTN, NASH cirrhosis, dyslipidemia. Patient presenting with left great toe pain and infection.  XR w/ erosive changes concerning for acute osteomyelitis  SCr 1.8 - above baseline WBC 10; LA 2; T 97.9 F; HR 85; RR 20  Plan: Metronidazole per MD Cefepime 2g q12hr Trend WBC, Fever, Renal function, & Clinical course F/u cultures, clinical course, WBC, fever De-escalate when able  Height: 5' 11"  (180.3 cm) Weight: 113.4 kg (250 lb) IBW/kg (Calculated) : 75.3  Temp (24hrs), Avg:97.9 F (36.6 C), Min:97.9 F (36.6 C), Max:97.9 F (36.6 C)  Recent Labs  Lab 12/25/21 1734 12/25/21 1743  WBC 10.0  --   CREATININE 1.80*  --   LATICACIDVEN  --  2.0*    Estimated Creatinine Clearance: 53.8 mL/min (A) (by C-G formula based on SCr of 1.8 mg/dL (H)).    Allergies  Allergen Reactions   Bee Venom Anaphylaxis   Hydrocodone Bit-Homatrop Mbr Other (See Comments)    Hallucinations, confusion, delirium Depressed feeling   Toradol [Ketorolac Tromethamine] Other (See Comments)    Hallucinations, confusion, delirium   Sulfadiazine     NDC [id number] NDC LHTD:[id number] NDC IOMB:[id number]   Morphine And Related Other (See Comments)    Hallucinations, back in the 80s. States has taken vicodin before w/o problems    Sulfa Drugs Cross Reactors Rash   Antimicrobials this admission: cefepime 6/29 >>  metronidazole 6/29 >>  Microbiology results: Pending  Thank you for allowing pharmacy to be a part of this patient's care.  Lorelei Pont, PharmD, BCPS 12/25/2021 7:06 PM ED Clinical Pharmacist -  [phone]

## 2021-12-25 NOTE — ED Provider Notes (Signed)
[facility] EMERGENCY DEPARTMENT Provider Note   CSN: [id number] Arrival date & time: 12/25/21  1642     History  Chief Complaint  Patient presents with   Toe Pain    James [name] is a 66 y.o. male.  66 year old male with past medical history of diabetes, hypertension, psoriatic arthritis, neuropathy, CAD presents with concern for left great toe pain and infection.  Patient reports neuropathy in his feet, when he gets ingrown toenails from time to time, he removes the toenail.  He states that he did this a few weeks ago, now has pain, swelling, redness in the area.  Patient has been cleaning with peroxide, applied Neosporin today.  Reports pain up into the foot and ankle.       Home Medications Prior to Admission medications   Medication Sig Start Date End Date Taking? Authorizing Provider  acetaminophen (TYLENOL) 325 MG tablet Take 2 tablets (650 mg total) by mouth every 4 (four) hours as needed for headache or mild pain. 08/18/21   Isaiah Serge, NP  Adalimumab (HUMIRA PEN) 40 MG/0.8ML PNKT INJECT 40 MG (0.8 ML) UNDER THE SKIN EVERY OTHER WEEK. 12/08/21   Tresa Garter, MD  amLODipine (NORVASC) 5 MG tablet Take 1 tablet (5 mg total) by mouth daily. 12/15/21   Park Liter, MD  amoxicillin-clavulanate (AUGMENTIN) 875-125 MG tablet Take 1 tablet by mouth 2 (two) times daily. 10/24/21   Colon Branch, MD  aspirin 81 MG chewable tablet Chew 1 tablet (81 mg total) by mouth daily. 08/19/21   Isaiah Serge, NP  atorvastatin (LIPITOR) 80 MG tablet Take 1 tablet (80 mg total) by mouth at bedtime. 09/19/21   Colon Branch, MD  carvedilol (COREG) 12.5 MG tablet TAKE 1 TABLET (12.5 MG TOTAL) BY MOUTH 2 (TWO) TIMES DAILY WITH A MEAL. Patient taking differently: Take 12.5 mg by mouth 2 (two) times daily with a meal. 08/04/21 08/04/22  Colon Branch, MD  clonazePAM (KLONOPIN) 0.5 MG tablet TAKE 1 TABLET BY MOUTH EVERY NIGHT AT BEDTIME 12/16/21 06/14/22  Sater, Nanine Means, MD   clopidogrel (PLAVIX) 75 MG tablet TAKE 1 TABLET (75 MG TOTAL) BY MOUTH DAILY. 12/16/21 12/16/22  Park Liter, MD  Continuous Blood Gluc Sensor (FREESTYLE LIBRE 14 DAY SENSOR) MISC Apply 1 sensor every 14 (fourteen) days. 12/16/21   Elayne Snare, MD  DULoxetine (CYMBALTA) 60 MG capsule TAKE 2 CAPSULES (120 MG TOTAL) BY MOUTH DAILY. Patient taking differently: Take 60 mg by mouth daily. 09/19/21   Colon Branch, MD  EPINEPHrine (EPIPEN 2-PAK) 0.3 mg/0.3 mL IJ SOAJ injection Inject 0.3 mLs (0.3 mg total) into the muscle once as needed for up to 1 dose for anaphylaxis. 01/10/20   Colon Branch, MD  fenofibrate micronized (LOFIBRA) 134 MG capsule TAKE 1 CAPSULE BY MOUTH THREE TIMES WEEKLY Patient taking differently: Take 134 mg by mouth daily before breakfast. 10/22/21 10/22/22  Elayne Snare, MD  gabapentin (NEURONTIN) 600 MG tablet TAKE 1 TABLET (600 MG TOTAL) BY MOUTH 2 (TWO) TIMES DAILY. Patient taking differently: Take 600 mg by mouth 2 (two) times daily. 07/24/21 07/24/22  Colon Branch, MD  glimepiride (AMARYL) 4 MG tablet TAKE 1 TABLET BY MOUTH ONCE DAILY BEFORE BREAKFAST Patient taking differently: Take 4 mg by mouth daily before breakfast. 11/19/21 11/19/22  Elayne Snare, MD  isosorbide mononitrate (IMDUR) 60 MG 24 hr tablet Take 1 tablet (60 mg total) by mouth daily. 08/19/21   Isaiah Serge,  NP  metFORMIN (GLUCOPHAGE) 500 MG tablet Take 2 tablets (1,000 mg total) by mouth 2 (two) times daily. 08/21/21   Isaiah Serge, NP  nitroGLYCERIN (NITROSTAT) 0.4 MG SL tablet Place 1 tablet (0.4 mg total) under the tongue every 5 (five) minutes for 3 doses as needed for chest pain. 06/26/21   Colon Branch, MD  pantoprazole (PROTONIX) 40 MG tablet Take 1 tablet (40 mg total) by mouth daily. 12/15/21   Colon Branch, MD  Semaglutide, 2 MG/DOSE, (OZEMPIC, 2 MG/DOSE,) 8 MG/3ML SOPN Inject 2 mg into the skin weekly Patient taking differently: Inject 2 mg into the skin once a week. Inject 2 mg into the skin weekly 11/04/21    Elayne Snare, MD      Allergies    Bee venom, Hydrocodone bit-homatrop mbr, Toradol [ketorolac tromethamine], Sulfadiazine, Morphine and related, and Sulfa drugs cross reactors    Review of Systems   Review of Systems Negative except as per HPI  Physical Exam Updated Vital Signs BP 137/80   Pulse 85   Temp 97.9 F (36.6 C) (Oral)   Resp 20   Ht 5' 11"  (1.803 m)   Wt 113.4 kg   SpO2 94%   BMI 34.87 kg/m  Physical Exam Vitals and nursing note reviewed.  Constitutional:      General: He is not in acute distress.    Appearance: He is well-developed. He is not diaphoretic.  HENT:     Head: Normocephalic and atraumatic.  Cardiovascular:     Pulses: Normal pulses.  Pulmonary:     Effort: Pulmonary effort is normal.  Musculoskeletal:        General: Swelling and tenderness present.  Skin:    General: Skin is warm and dry.     Findings: Erythema present.     Comments: Swelling, erythema, tenderness to left great toe, no drainage, question slight redness/streak along 1st metatarsal to ankle.  Neurological:     Mental Status: He is alert and oriented to person, place, and time.     Motor: No weakness.  Psychiatric:        Behavior: Behavior normal.        ED Results / Procedures / Treatments   Labs (all labs ordered are listed, but only abnormal results are displayed) Labs Reviewed  BASIC METABOLIC PANEL - Abnormal; Notable for the following components:      Result Value   Glucose, Bld 176 (*)    BUN 25 (*)    Creatinine, Ser 1.80 (*)    GFR, Estimated 42 (*)    All other components within normal limits  LACTIC ACID, PLASMA - Abnormal; Notable for the following components:   Lactic Acid, Venous 2.0 (*)    All other components within normal limits  CULTURE, BLOOD (ROUTINE X 2)  CULTURE, BLOOD (ROUTINE X 2)  CBC WITH DIFFERENTIAL/PLATELET  SEDIMENTATION RATE  C-REACTIVE PROTEIN    EKG None  Radiology DG Foot Complete Left  Result Date: 12/25/2021 CLINICAL  DATA:  Diabetic foot infection EXAM: LEFT FOOT - COMPLETE 3+ VIEW COMPARISON:  Foot x-rays August 04, 2016. FINDINGS: Erosive changes of the tuft of the first toe. Surrounding soft tissue swelling/ulcer. Milder erosive change along the lateral aspect of the distal first metatarsal. Remote trauma versus postsurgical change of the distal fifth digit. No evidence of acute fracture or joint malalignment. Vascular calcifications. Mild scattered degenerative change. IMPRESSION: 1. Erosive changes of the tuft of the first toe, suspicious for acute osteomyelitis given  the clinical history and surrounding soft tissue swelling/ulcer. 2. Milder erosive change along the lateral aspect of the distal first metatarsal, which could represent age indeterminate sequela of osteomyelitis. 3. MRI could further evaluate if clinically warranted. Electronically Signed   By: Margaretha Sheffield M.D.   On: 12/25/2021 17:22    Procedures Procedures    Medications Ordered in ED Medications  oxyCODONE-acetaminophen (PERCOCET/ROXICET) 5-325 MG per tablet 1 tablet (1 tablet Oral Not Given 12/25/21 1703)  metroNIDAZOLE (FLAGYL) IVPB 500 mg (has no administration in time range)  sodium chloride 0.9 % bolus 500 mL (500 mLs Intravenous New Bag/Given 12/25/21 1825)    ED Course/ Medical Decision Making/ A&P                           Medical Decision Making  This patient presents to the ED for concern of left great toe infection, this involves an extensive number of treatment options, and is a complaint that carries with it a high risk of complications and morbidity.  The differential diagnosis includes but not limited to osteomyelitis, cellulitis  Co morbidities that complicate the patient evaluation  CAD, diabetes, hypertension, hyperlipidemia   Additional history obtained:  External records from outside source obtained and reviewed including cardiac catheterization from 08/18/2021, EF estimated 50%   Lab Tests:  I  Ordered, and personally interpreted labs.  The pertinent results include: CBC with normal white count, BMP with mildly increased creatinine at 1.8 (previously 1.4 one month ago).  Lactic acid elevated at 2.0   Imaging Studies ordered:  I ordered imaging studies including left foot x-ray I independently visualized and interpreted imaging which showed bony changes concerning for osteomyelitis I agree with the radiologist interpretation  Consultations Obtained:  I requested consultation with the right wrist, Dr. Shanda Bumps,  and discussed lab and imaging findings as well as pertinent plan - they recommend: Admit to hospitalist service, Dr. Earleen Newport to follow, requests basic labs with sed rate and CRP, antibiotics per protocol Discussed case with Dr. Flossie Buffy with [facility] hospitalist service who will admit.   Problem List / ED Course / Critical interventions / Medication management  66 year old male with diabetes and additional history as listed above with concern for left great toe infection.  X-ray concerning for osteomyelitis.  Discussed with podiatry who would like patient admitted.  White count normal, creatinine mildly increased.  EF 50 on prior heart cath, given gentle fluid bolus.  Initial lactic is elevated at 2, will repeat and trend. I ordered medication including antibiotics for osteomyelitis, diabetic foot infection Reevaluation of the patient after these medicines showed that the patient stayed the same I have reviewed the patients home medicines and have made adjustments as needed   Social Determinants of Health:  Has PCP and specialty care team   Test / Admission - Considered:  Plan is for admission for osteomyelitis, diabetic foot infection         Final Clinical Impression(s) / ED Diagnoses Final diagnoses:  Osteomyelitis of great toe of left foot ([place])  Diabetic foot infection [facility])    Rx / DC Orders ED Discharge Orders     None         Tacy Learn,  PA-C 12/25/21 [place], [place], DO 12/25/21 2006

## 2021-12-25 NOTE — ED Triage Notes (Signed)
Left great toe pain since this morning States cut toenail out 2 weeks ago Infection symptoms noted, pain radiating up foot  Pt has h/o DM

## 2021-12-25 NOTE — Progress Notes (Signed)
Plan of Care Note for accepted transfer   Patient: James [name] MRN: [id number]   [place]: 12/25/2021  Facility requesting transfer: [facility] ED  Requesting Provider: Rubye Oaks PA  Reason for transfer: Osteomyelitis Facility course:  66 year old male with medical history of CAD s/p PTCA of the LAD, diabetes, hypertension, NASH cirrhosis, dyslipidemia who presented with concerns of left great toe pain and infection.  X-ray shows erosive changes concerning for acute osteomyelitis.  ED PA discussed with Dr. Earleen Newport podiatry and they will see in consultation tomorrow.  Has been started on IV antibiotics.  He is otherwise hemodynamically stable with mild AKI with creatinine of 1.8.  Plan of care: The patient is accepted for admission to Telemetry unit, at [facility]..  ED provider to continue care of patient while he remains in the ED.  Author: Orene Desanctis, DO 12/25/2021  Check [email or web address] for on-call coverage.  Nursing staff, Please call [place] number on Amion as soon as patient's arrival, so appropriate admitting provider can evaluate the pt.

## 2021-12-25 NOTE — ED Notes (Signed)
Transported to [facility] via [facility]

## 2021-12-25 NOTE — ED Notes (Signed)
X-ray at bedside

## 2021-12-26 ENCOUNTER — Encounter (HOSPITAL_COMMUNITY): Payer: Self-pay | Admitting: Family Medicine

## 2021-12-26 ENCOUNTER — Inpatient Hospital Stay (HOSPITAL_COMMUNITY): Payer: 59

## 2021-12-26 ENCOUNTER — Ambulatory Visit: Payer: 59 | Admitting: Internal Medicine

## 2021-12-26 DIAGNOSIS — M86672 Other chronic osteomyelitis, left ankle and foot: Secondary | ICD-10-CM

## 2021-12-26 DIAGNOSIS — M869 Osteomyelitis, unspecified: Secondary | ICD-10-CM | POA: Diagnosis not present

## 2021-12-26 DIAGNOSIS — N179 Acute kidney failure, unspecified: Secondary | ICD-10-CM

## 2021-12-26 LAB — BASIC METABOLIC PANEL
Anion gap: 8 (ref 5–15)
BUN: 18 mg/dL (ref 8–23)
CO2: 23 mmol/L (ref 22–32)
Calcium: 9.6 mg/dL (ref 8.9–10.3)
Chloride: 106 mmol/L (ref 98–111)
Creatinine, Ser: 1.6 mg/dL — ABNORMAL HIGH (ref 0.61–1.24)
GFR, Estimated: 48 mL/min — ABNORMAL LOW (ref >60)
Glucose, Bld: 127 mg/dL — ABNORMAL HIGH (ref 70–99)
Potassium: 3.9 mmol/L (ref 3.5–5.1)
Sodium: 137 mmol/L (ref 135–145)

## 2021-12-26 LAB — BLOOD CULTURE ID PANEL (REFLEXED) - BCID2

## 2021-12-26 LAB — CBC
HCT: 40.7 % (ref 39.0–52.0)
Hemoglobin: 13.5 g/dL (ref 13.0–17.0)
MCH: 29.1 pg (ref 26.0–34.0)
MCHC: 33.2 g/dL (ref 30.0–36.0)
MCV: 87.7 fL (ref 80.0–100.0)
Platelets: 118 10*3/uL — ABNORMAL LOW (ref 150–400)
RBC: 4.64 MIL/uL (ref 4.22–5.81)
RDW: 15.2 % (ref 11.5–15.5)
WBC: 8.7 10*3/uL (ref 4.0–10.5)
nRBC: 0 % (ref 0.0–0.2)

## 2021-12-26 LAB — C-REACTIVE PROTEIN: CRP: 5.4 mg/dL — ABNORMAL HIGH (ref <1.0)

## 2021-12-26 LAB — HIV ANTIBODY (ROUTINE TESTING W REFLEX): HIV Screen 4th Generation wRfx: NONREACTIVE

## 2021-12-26 LAB — SURGICAL PCR SCREEN
MRSA, PCR: POSITIVE — AB
Staphylococcus aureus: POSITIVE — AB

## 2021-12-26 LAB — URIC ACID: Uric Acid, Serum: 6.5 mg/dL (ref 3.7–8.6)

## 2021-12-26 LAB — SEDIMENTATION RATE: Sed Rate: 30 mm/hr — ABNORMAL HIGH (ref 0–16)

## 2021-12-26 LAB — PREALBUMIN: Prealbumin: 19.4 mg/dL (ref 18–38)

## 2021-12-26 LAB — GLUCOSE, CAPILLARY
Glucose-Capillary: 132 mg/dL — ABNORMAL HIGH (ref 70–99)
Glucose-Capillary: 140 mg/dL — ABNORMAL HIGH (ref 70–99)
Glucose-Capillary: 126 mg/dL — ABNORMAL HIGH (ref 70–99)
Glucose-Capillary: 174 mg/dL — ABNORMAL HIGH (ref 70–99)
Glucose-Capillary: 143 mg/dL — ABNORMAL HIGH (ref 70–99)
Glucose-Capillary: 128 mg/dL — ABNORMAL HIGH (ref 70–99)

## 2021-12-26 MED ORDER — CLONAZEPAM 0.5 MG PO TABS
0.5000 mg | ORAL_TABLET | Freq: Every day | ORAL | Status: DC
  Administered 2021-12-26 – 2021-12-31 (×7): 0.5 mg via ORAL
  Filled 2021-12-26 (×7): qty 1

## 2021-12-26 MED ORDER — VANCOMYCIN HCL 1500 MG/300ML IV SOLN
1500.0000 mg | INTRAVENOUS | Status: DC
  Filled 2021-12-26: qty 300

## 2021-12-26 MED ORDER — PROSOURCE PLUS PO LIQD
30.0000 mL | Freq: Two times a day (BID) | ORAL | Status: DC
  Administered 2021-12-26 – 2021-12-29 (×5): 30 mL via ORAL
  Filled 2021-12-26 (×9): qty 30

## 2021-12-26 MED ORDER — SODIUM CHLORIDE 0.9 % IV SOLN: INTRAVENOUS | Status: AC

## 2021-12-26 MED ORDER — CARVEDILOL 12.5 MG PO TABS
12.5000 mg | ORAL_TABLET | Freq: Two times a day (BID) | ORAL | Status: DC
  Administered 2021-12-26 – 2022-01-01 (×12): 12.5 mg via ORAL
  Filled 2021-12-26 (×13): qty 1

## 2021-12-26 MED ORDER — ACETAMINOPHEN 325 MG PO TABS
650.0000 mg | ORAL_TABLET | Freq: Four times a day (QID) | ORAL | Status: DC | PRN
  Administered 2021-12-26 – 2021-12-30 (×3): 650 mg via ORAL
  Filled 2021-12-26 (×3): qty 2

## 2021-12-26 MED ORDER — HEPARIN SODIUM (PORCINE) 5000 UNIT/ML IJ SOLN
5000.0000 [IU] | Freq: Three times a day (TID) | INTRAMUSCULAR | Status: DC
  Administered 2021-12-26 – 2022-01-01 (×16): 5000 [IU] via SUBCUTANEOUS
  Filled 2021-12-26 (×17): qty 1

## 2021-12-26 MED ORDER — HYDROMORPHONE HCL 1 MG/ML IJ SOLN
0.5000 mg | INTRAMUSCULAR | Status: DC | PRN
  Administered 2021-12-26 – 2021-12-29 (×5): 0.5 mg via INTRAVENOUS
  Filled 2021-12-26 (×6): qty 0.5

## 2021-12-26 MED ORDER — GABAPENTIN 300 MG PO CAPS
600.0000 mg | ORAL_CAPSULE | Freq: Two times a day (BID) | ORAL | Status: DC
Start: 2021-12-26 — End: 2021-12-26
  Administered 2021-12-26: 600 mg via ORAL
  Filled 2021-12-26: qty 2

## 2021-12-26 MED ORDER — ATORVASTATIN CALCIUM 80 MG PO TABS
80.0000 mg | ORAL_TABLET | Freq: Every day | ORAL | Status: DC
  Administered 2021-12-26 – 2021-12-31 (×7): 80 mg via ORAL
  Filled 2021-12-26 (×7): qty 1

## 2021-12-26 MED ORDER — VANCOMYCIN HCL 2000 MG/400ML IV SOLN
2000.0000 mg | Freq: Once | INTRAVENOUS | Status: AC
  Administered 2021-12-26: 2000 mg via INTRAVENOUS
  Filled 2021-12-26: qty 400

## 2021-12-26 MED ORDER — PANTOPRAZOLE SODIUM 40 MG PO TBEC
40.0000 mg | DELAYED_RELEASE_TABLET | Freq: Every day | ORAL | Status: DC
  Administered 2021-12-26 – 2022-01-01 (×6): 40 mg via ORAL
  Filled 2021-12-26 (×6): qty 1

## 2021-12-26 MED ORDER — ACETAMINOPHEN 650 MG RE SUPP: 650.0000 mg | Freq: Four times a day (QID) | RECTAL | Status: DC | PRN

## 2021-12-26 MED ORDER — CEFAZOLIN SODIUM-DEXTROSE 2-4 GM/100ML-% IV SOLN
2.0000 g | Freq: Three times a day (TID) | INTRAVENOUS | Status: DC
  Administered 2021-12-26 – 2022-01-01 (×17): 2 g via INTRAVENOUS
  Filled 2021-12-26 (×17): qty 100

## 2021-12-26 MED ORDER — INSULIN ASPART 100 UNIT/ML IJ SOLN
0.0000 [IU] | INTRAMUSCULAR | Status: DC
  Administered 2021-12-26 – 2021-12-27 (×2): 1 [IU] via SUBCUTANEOUS

## 2021-12-26 MED ORDER — ADULT MULTIVITAMIN W/MINERALS CH
1.0000 | ORAL_TABLET | Freq: Every day | ORAL | Status: DC
  Administered 2021-12-26 – 2022-01-01 (×6): 1 via ORAL
  Filled 2021-12-26 (×6): qty 1

## 2021-12-26 MED ORDER — MUPIROCIN 2 % EX OINT
1.0000 | TOPICAL_OINTMENT | Freq: Two times a day (BID) | CUTANEOUS | Status: AC
  Administered 2021-12-26 – 2021-12-30 (×10): 1 via NASAL
  Filled 2021-12-26 (×5): qty 22

## 2021-12-26 MED ORDER — ISOSORBIDE MONONITRATE ER 60 MG PO TB24
60.0000 mg | ORAL_TABLET | Freq: Every day | ORAL | Status: DC
  Administered 2021-12-26 – 2022-01-01 (×6): 60 mg via ORAL
  Filled 2021-12-26 (×6): qty 1

## 2021-12-26 MED ORDER — DULOXETINE HCL 60 MG PO CPEP
60.0000 mg | ORAL_CAPSULE | Freq: Every day | ORAL | Status: DC
  Administered 2021-12-26 – 2021-12-31 (×7): 60 mg via ORAL
  Filled 2021-12-26 (×7): qty 1

## 2021-12-26 MED ORDER — CHLORHEXIDINE GLUCONATE CLOTH 2 % EX PADS
6.0000 | MEDICATED_PAD | Freq: Every day | CUTANEOUS | Status: AC
  Administered 2021-12-26 – 2021-12-30 (×5): 6 via TOPICAL

## 2021-12-26 MED ORDER — GABAPENTIN 300 MG PO CAPS
300.0000 mg | ORAL_CAPSULE | Freq: Two times a day (BID) | ORAL | Status: DC
  Administered 2021-12-26 – 2022-01-01 (×12): 300 mg via ORAL
  Filled 2021-12-26 (×12): qty 1

## 2021-12-26 MED ORDER — AMLODIPINE BESYLATE 5 MG PO TABS
5.0000 mg | ORAL_TABLET | Freq: Every day | ORAL | Status: DC
  Administered 2021-12-26 – 2022-01-01 (×6): 5 mg via ORAL
  Filled 2021-12-26 (×6): qty 1

## 2021-12-26 NOTE — H&P (View-Only) (Signed)
Reason for Consult: Osteomyelitis  Referring Physician: Dr. Eulogio Bear, DO  James [name] is an 66 y.o. male.  HPI: 66 y.o. male with medical history significant for coronary artery disease, cirrhosis, hypertension, OSA with CPAP intolerance, well-controlled type 2 diabetes mellitus, and CKD 3B, now presenting to the emergency department with pain, redness, and swelling involving the left first toe.  Patient reports that he had an ingrown toenail that he removed himself 2 weeks ago, the reason the toe became more painful which brought him to the emergency room.  Recommended admission given osteomyelitis.  Past Medical History:  Diagnosis Date   Abnormal cardiac CT angiography    Acid reflux    Annual physical exam 04/08/2015   Arthritis    Atypical chest pain 06/13/2020   Blood transfusion without reported diagnosis    Body mass index (BMI) 35.0-35.9, adult 04/05/2019   Chronic fatigue 01/28/2015   Chronic headaches    on cymbalta   Chronic migraine w/o aura, not intractable, w/o stat migr 10/24/2018   Cirrhosis ([place])    Colon polyps    Complication of anesthesia    problems waking up from anesthesia   Coronary artery disease 01/23/2019   Depression    on cymbalta   Diabetes mellitus with neuropathy ([place])    Diabetes with neuropathy 04/25/2013   Diverticulitis 03/2013   Dyslipidemia 05/29/2019   Eczema    Elevated LFTs    Epidural lipomatosis 10/05/2018   Essential (primary) hypertension 04/05/2019   Essential hypertension 10/10/2019   Fatty liver    GERD (gastroesophageal reflux disease) 04/28/2011   H/O craniotomy 05/07/2015   Headache 04/28/2011   Hepatitis 10/2017   NASH cirrhosis   History of kidney stones    Hyperlipidemia    Hypersomnia with sleep apnea 01/28/2015   Hypertension    IDA (iron deficiency anemia) 01/24/2019   Idiopathic intracranial hypertension 01/14/2017   Insomnia 04/26/2013   Kidney stone    Liver cirrhosis secondary to NASH (nonalcoholic  steatohepatitis) ([place]) 01/02/2016   Low back pain 04/05/2019   Lower back injury 08/14/2019   Morbid obesity ([place])    Neuromuscular disorder ([place])    neuropathy   Neuropathy    Nonalcoholic steatohepatitis 03/05/6733   Obstructive hydrocephalus ([place]) 01/28/2015   OSA -- dx ~ 2012, cpap intolerant 09/04/2014    dx ~ 2012, cpap intolerant    PCP NOTES >>> 04/08/2015   Post-op pain 03/19/2019   Post-traumatic hydrocephalus ([place])    s/p shunts x 2 (first got infected )   Presence of cerebrospinal fluid drainage device 07/28/2011   Psoriasis    sees Dr [name]   Psoriatic arthritis ([place])    REM behavioral disorder 01/14/2017   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   see's Dr.Ortmann   Severe obesity (BMI >= 40) ([place]) 01/28/2015   SI (sacroiliac) joint dysfunction 08/14/2019   Sigmoid diverticulitis 04/25/2013   Sleep apnea    no CPAP      Spondylolisthesis, lumbar region 03/16/2019   Stomach ulcer    Testosterone deficiency 04/28/2011   VP (ventriculoperitoneal) shunt status 07/31/2020    Past Surgical History:  Procedure Laterality Date   [place]     VP shunts placed in 2007   CHOLECYSTECTOMY N/A 08/25/2017   Procedure: LAPAROSCOPIC CHOLECYSTECTOMY WITH INTRAOPERATIVE CHOLANGIOGRAM;  Surgeon: [name], MD;  Location: [facility];  Service: General;  Laterality: N/A;   COLONOSCOPY     CORONARY STENT INTERVENTION  N/A 09/27/2020   Procedure: CORONARY STENT INTERVENTION;  Surgeon: Jettie Booze, MD;  Location: [facility];  Service: Cardiovascular;  Laterality: N/A;   INTRAVASCULAR ULTRASOUND/IVUS N/A 09/27/2020   Procedure: Intravascular Ultrasound/IVUS;  Surgeon: Jettie Booze, MD;  Location: [facility];  Service: Cardiovascular;  Laterality: N/A;   JOINT REPLACEMENT     total hip   LEFT HEART CATH N/A 09/27/2020   Procedure: Left Heart Cath;  Surgeon: Jettie Booze, MD;  Location: [facility];  Service:  Cardiovascular;  Laterality: N/A;   LEFT HEART CATH AND CORONARY ANGIOGRAPHY N/A 09/24/2020   Procedure: LEFT HEART CATH AND CORONARY ANGIOGRAPHY;  Surgeon: Jettie Booze, MD;  Location: [facility];  Service: Cardiovascular;  Laterality: N/A;   LEFT HEART CATH AND CORONARY ANGIOGRAPHY N/A 08/18/2021   Procedure: LEFT HEART CATH AND CORONARY ANGIOGRAPHY;  Surgeon: Troy Sine, MD;  Location: [facility];  Service: Cardiovascular;  Laterality: N/A;   LUMBAR FUSION  03/16/2019   SHOULDER SURGERY Left 2010   TOE SURGERY Left 2018   TONSILLECTOMY     as a child   TOTAL HIP ARTHROPLASTY Left 2011   UPPER GASTROINTESTINAL ENDOSCOPY  01/04/2020   VENTRICULOPERITONEAL SHUNT  2007   x2    Family History  Problem Relation Age of Onset   Other Mother    Lung cancer Father        alive, former smoker    Heart disease Brother        MI age 26   Other Brother        Murdered   Down syndrome Son    Diabetes Neg Hx    Prostate cancer Neg Hx    Colon cancer Neg Hx    Stomach cancer Neg Hx    Pancreatic cancer Neg Hx    Liver disease Neg Hx     Social History:  reports that he has never smoked. He has never used smokeless tobacco. He reports that he does not drink alcohol and does not use drugs.  Allergies:  Allergies  Allergen Reactions   Bee Venom Anaphylaxis   Hydrocodone Bit-Homatrop Mbr Other (See Comments)    Hallucinations, confusion, delirium Depressed feeling   Toradol [Ketorolac Tromethamine] Other (See Comments)    Hallucinations, confusion, delirium   Sulfadiazine     NDC SVXB:[id number] NDC AQTM:[id number] NDC BWLS:[id number]   Morphine And Related Other (See Comments)    Hallucinations, back in the 80s. States has taken vicodin before w/o problems    Sulfa Drugs Cross Reactors Rash    Medications: I have reviewed the patient's current medications.  Results for orders placed or performed during the hospital encounter of 12/25/21 (from the  past 48 hour(s))  CBC with Differential     Status: None   Collection Time: 12/25/21  5:34 PM  Result Value Ref Range   WBC 10.0 4.0 - 10.5 K/uL   RBC 4.75 4.22 - 5.81 MIL/uL   Hemoglobin 14.2 13.0 - 17.0 g/dL   HCT 41.3 39.0 - 52.0 %   MCV 86.9 80.0 - 100.0 fL   MCH 29.9 26.0 - 34.0 pg   MCHC 34.4 30.0 - 36.0 g/dL   RDW 15.1 11.5 - 15.5 %   Platelets 159 150 - 400 K/uL   nRBC 0.0 0.0 - 0.2 %   Neutrophils Relative % 75 %   Neutro Abs 7.4 1.7 - 7.7 K/uL   Lymphocytes Relative 14 %  Lymphs Abs 1.4 0.7 - 4.0 K/uL   Monocytes Relative 10 %   Monocytes Absolute 1.0 0.1 - 1.0 K/uL   Eosinophils Relative 1 %   Eosinophils Absolute 0.1 0.0 - 0.5 K/uL   Basophils Relative 0 %   Basophils Absolute 0.0 0.0 - 0.1 K/uL   Immature Granulocytes 0 %   Abs Immature Granulocytes 0.04 0.00 - 0.07 K/uL    Comment: Performed at [facility], [address] [place], [place] [address]  Basic metabolic panel     Status: Abnormal   Collection Time: 12/25/21  5:34 PM  Result Value Ref Range   Sodium 135 135 - 145 mmol/L   Potassium 4.4 3.5 - 5.1 mmol/L   Chloride 105 98 - 111 mmol/L   CO2 23 22 - 32 mmol/L   Glucose, Bld 176 (H) 70 - 99 mg/dL    Comment: Glucose reference range applies only to samples taken after fasting for at least 8 hours.   BUN 25 (H) 8 - 23 mg/dL   Creatinine, Ser 1.80 (H) 0.61 - 1.24 mg/dL   Calcium 9.7 8.9 - 10.3 mg/dL   GFR, Estimated 42 (L) >60 mL/min    Comment: (NOTE) Calculated using the CKD-EPI Creatinine Equation (2021)    Anion gap 7 5 - 15    Comment: Performed at [facility], [address] [place], [place] [address]  Sedimentation rate     Status: Abnormal   Collection Time: 12/25/21  5:34 PM  Result Value Ref Range   Sed Rate 31 (H) 0 - 16 mm/hr    Comment: Performed at [facility], [address] [place], [place] [address]  C-reactive protein     Status: Abnormal   Collection Time: 12/25/21  5:35 PM  Result Value  Ref Range   CRP 1.7 (H) <1.0 mg/dL    Comment: Performed at [facility] [address] [place], [place] [address]  Lactic acid     Status: Abnormal   Collection Time: 12/25/21  5:43 PM  Result Value Ref Range   Lactic Acid, Venous 2.0 (HH) 0.5 - 1.9 mmol/L    Comment: CRITICAL RESULT CALLED TO, READ BACK BY AND VERIFIED WITH: Athena Masse RN AT 1828 ON 12/25/21 BY [email or web address] Performed at [facility], [address] [place], [place] [address]   Sedimentation rate     Status: Abnormal   Collection Time: 12/26/21  4:50 AM  Result Value Ref Range   Sed Rate 30 (H) 0 - 16 mm/hr    Comment: Performed at [facility], [place] [address], [place], [place] [address]  C-reactive protein     Status: Abnormal   Collection Time: 12/26/21  4:50 AM  Result Value Ref Range   CRP 5.4 (H) <1.0 mg/dL    Comment: Performed at [facility] [address], [place], [place] [address]  Prealbumin     Status: None   Collection Time: 12/26/21  4:50 AM  Result Value Ref Range   Prealbumin 19.4 18 - 38 mg/dL    Comment: Performed at [facility] [address] [place], [place] [address]  HIV Antibody (routine testing w rflx)     Status: None   Collection Time: 12/26/21  4:50 AM  Result Value Ref Range   HIV Screen 4th Generation wRfx Non Reactive Non Reactive    Comment: Performed at [facility],  [address], [place], [place] [address]  Basic metabolic panel     Status: Abnormal   Collection Time: 12/26/21  4:50 AM  Result Value Ref Range   Sodium 137 135 - 145 mmol/L   Potassium 3.9 3.5 - 5.1 mmol/L   Chloride 106 98 - 111 mmol/L   CO2 23 22 - 32 mmol/L   Glucose, Bld 127 (H) 70 - 99 mg/dL    Comment: Glucose reference range applies only to samples taken after fasting for at least 8 hours.   BUN 18 8 - 23 mg/dL   Creatinine, Ser 1.60 (H) 0.61 - 1.24 mg/dL   Calcium 9.6 8.9 - 10.3 mg/dL   GFR, Estimated 48 (L) >60 mL/min    Comment: (NOTE) Calculated  using the CKD-EPI Creatinine Equation (2021)    Anion gap 8 5 - 15    Comment: Performed at [facility] [address] [place], [place] [address]  CBC     Status: Abnormal   Collection Time: 12/26/21  4:50 AM  Result Value Ref Range   WBC 8.7 4.0 - 10.5 K/uL   RBC 4.64 4.22 - 5.81 MIL/uL   Hemoglobin 13.5 13.0 - 17.0 g/dL   HCT 40.7 39.0 - 52.0 %   MCV 87.7 80.0 - 100.0 fL   MCH 29.1 26.0 - 34.0 pg   MCHC 33.2 30.0 - 36.0 g/dL   RDW 15.2 11.5 - 15.5 %   Platelets 118 (L) 150 - 400 K/uL    Comment: REPEATED TO VERIFY   nRBC 0.0 0.0 - 0.2 %    Comment: Performed at [facility], [place] [address] [place], [place] [address]  Glucose, capillary     Status: Abnormal   Collection Time: 12/26/21  5:09 AM  Result Value Ref Range   Glucose-Capillary 132 (H) 70 - 99 mg/dL    Comment: Glucose reference range applies only to samples taken after fasting for at least 8 hours.  Surgical pcr screen     Status: Abnormal   Collection Time: 12/26/21  5:21 AM   Specimen: Nasal Mucosa; Nasal Swab  Result Value Ref Range   MRSA, PCR POSITIVE (A) NEGATIVE    Comment: RESULT CALLED TO, READ BACK BY AND VERIFIED WITH: RN KAY BROWN 12/26/21@6 :50 BY TW    Staphylococcus aureus POSITIVE (A) NEGATIVE    Comment: (NOTE) The Xpert SA Assay (FDA approved for NASAL specimens in patients 21 years of age and older), is one component of a comprehensive surveillance program. It is not intended to diagnose infection nor to guide or monitor treatment. Performed at [facility], [place] [address], [place], [place] [address]   Glucose, capillary     Status: Abnormal   Collection Time: 12/26/21  8:10 AM  Result Value Ref Range   Glucose-Capillary 140 (H) 70 - 99 mg/dL    Comment: Glucose reference range applies only to samples taken after fasting for at least 8 hours.  Glucose, capillary     Status: Abnormal   Collection Time: 12/26/21 12:15 PM  Result Value Ref Range   Glucose-Capillary 126  (H) 70 - 99 mg/dL    Comment: Glucose reference range applies only to samples taken after fasting for at least 8 hours.   *Note: Due to a large number of results and/or encounters for the requested time period, some results have not been displayed. A complete set of results can be found in Results Review.    DG Foot  Complete Left  Result Date: 12/25/2021 CLINICAL DATA:  Diabetic foot infection EXAM: LEFT FOOT - COMPLETE 3+ VIEW COMPARISON:  Foot x-rays August 04, 2016. FINDINGS: Erosive changes of the tuft of the first toe. Surrounding soft tissue swelling/ulcer. Milder erosive change along the lateral aspect of the distal first metatarsal. Remote trauma versus postsurgical change of the distal fifth digit. No evidence of acute fracture or joint malalignment. Vascular calcifications. Mild scattered degenerative change. IMPRESSION: 1. Erosive changes of the tuft of the first toe, suspicious for acute osteomyelitis given the clinical history and surrounding soft tissue swelling/ulcer. 2. Milder erosive change along the lateral aspect of the distal first metatarsal, which could represent age indeterminate sequela of osteomyelitis. 3. MRI could further evaluate if clinically warranted. Electronically Signed   By: Margaretha Sheffield M.D.   On: 12/25/2021 17:22    Review of Systems Blood pressure 127/82, pulse 83, temperature 99.9 F (37.7 C), resp. rate 17, height 5' 11"  (1.803 m), weight 113.4 kg, SpO2 93 %. Physical Exam General: AAO x3, NAD  Dermatological: On the left hallux there is edema and erythema present to the hallux IPJ distally.  Wound is present along the area of the toenail.  There is no purulence identified.  There is no ascending cellulitis.  He does get tenderness to palpation from the toe extending up to the midfoot along the area of cellulitis.    Vascular: Dorsalis Pedis artery and Posterior Tibial artery pedal pulses are palpable bilateral with immedate capillary fill time. There  is no pain with calf compression, swelling, warmth, erythema.   Neruologic: Sensation decreased.  Musculoskeletal: No other areas of discomfort.   Assessment/Plan: Osteomyelitis left foot  -X-rays reviewed which shows likely osteomyelitis of the distal phalanx.  There is some mild erosive changes along the lateral aspect of the distal first metatarsal which could represent osteomyelitis.  Due to this I ordered an MRI. I called radiology to make sure it was done tonight as it is pending surgery for tomorrow.  -I discussed with the patient surgical versus conservative treatment for osteomyelitis.  After discussion he does agree to proceed with surgery.  We will proceed with a toe amputation tomorrow pending MRI.  We discussed the surgery as well as postoperative course.  Alternatives, risks, complications were discussed.  We discussed risks of surgery including spread of infection, delayed/nonhealing, blood clots, bleeding, scarring, further surgery/more proximal amputation. -NPO after midnight -Continue IV antibiotics for now -Podiatry will continue to follow.  Trula Slade 12/26/2021, 1:06 PM

## 2021-12-26 NOTE — Consult Note (Signed)
Reason for Consult: Osteomyelitis  Referring Physician: Dr. Eulogio Bear, DO  James [name] is an 67 y.o. male.  HPI: 66 y.o. male with medical history significant for coronary artery disease, cirrhosis, hypertension, OSA with CPAP intolerance, well-controlled type 2 diabetes mellitus, and CKD 3B, now presenting to the emergency department with pain, redness, and swelling involving the left first toe.  Patient reports that he had an ingrown toenail that he removed himself 2 weeks ago, the reason the toe became more painful which brought him to the emergency room.  Recommended admission given osteomyelitis.  Past Medical History:  Diagnosis Date   Abnormal cardiac CT angiography    Acid reflux    Annual physical exam 04/08/2015   Arthritis    Atypical chest pain 06/13/2020   Blood transfusion without reported diagnosis    Body mass index (BMI) 35.0-35.9, adult 04/05/2019   Chronic fatigue 01/28/2015   Chronic headaches    on cymbalta   Chronic migraine w/o aura, not intractable, w/o stat migr 10/24/2018   Cirrhosis ([place])    Colon polyps    Complication of anesthesia    problems waking up from anesthesia   Coronary artery disease 01/23/2019   Depression    on cymbalta   Diabetes mellitus with neuropathy ([place])    Diabetes with neuropathy 04/25/2013   Diverticulitis 03/2013   Dyslipidemia 05/29/2019   Eczema    Elevated LFTs    Epidural lipomatosis 10/05/2018   Essential (primary) hypertension 04/05/2019   Essential hypertension 10/10/2019   Fatty liver    GERD (gastroesophageal reflux disease) 04/28/2011   H/O craniotomy 05/07/2015   Headache 04/28/2011   Hepatitis 10/2017   NASH cirrhosis   History of kidney stones    Hyperlipidemia    Hypersomnia with sleep apnea 01/28/2015   Hypertension    IDA (iron deficiency anemia) 01/24/2019   Idiopathic intracranial hypertension 01/14/2017   Insomnia 04/26/2013   Kidney stone    Liver cirrhosis secondary to NASH (nonalcoholic  steatohepatitis) ([place]) 01/02/2016   Low back pain 04/05/2019   Lower back injury 08/14/2019   Morbid obesity ([place])    Neuromuscular disorder ([place])    neuropathy   Neuropathy    Nonalcoholic steatohepatitis 02/04/2799   Obstructive hydrocephalus ([place]) 01/28/2015   OSA -- dx ~ 2012, cpap intolerant 09/04/2014    dx ~ 2012, cpap intolerant    PCP NOTES >>> 04/08/2015   Post-op pain 03/19/2019   Post-traumatic hydrocephalus ([place])    s/p shunts x 2 (first got infected )   Presence of cerebrospinal fluid drainage device 07/28/2011   Psoriasis    sees Dr [name]   Psoriatic arthritis ([place])    REM behavioral disorder 01/14/2017   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   see's Dr.Ortmann   Severe obesity (BMI >= 40) ([place]) 01/28/2015   SI (sacroiliac) joint dysfunction 08/14/2019   Sigmoid diverticulitis 04/25/2013   Sleep apnea    no CPAP      Spondylolisthesis, lumbar region 03/16/2019   Stomach ulcer    Testosterone deficiency 04/28/2011   VP (ventriculoperitoneal) shunt status 07/31/2020    Past Surgical History:  Procedure Laterality Date   [place]     VP shunts placed in 2007   CHOLECYSTECTOMY N/A 08/25/2017   Procedure: LAPAROSCOPIC CHOLECYSTECTOMY WITH INTRAOPERATIVE CHOLANGIOGRAM;  Surgeon: [name], MD;  Location: [facility];  Service: General;  Laterality: N/A;   COLONOSCOPY     CORONARY STENT INTERVENTION  N/A 09/27/2020   Procedure: CORONARY STENT INTERVENTION;  Surgeon: Jettie Booze, MD;  Location: [facility];  Service: Cardiovascular;  Laterality: N/A;   INTRAVASCULAR ULTRASOUND/IVUS N/A 09/27/2020   Procedure: Intravascular Ultrasound/IVUS;  Surgeon: Jettie Booze, MD;  Location: [facility];  Service: Cardiovascular;  Laterality: N/A;   JOINT REPLACEMENT     total hip   LEFT HEART CATH N/A 09/27/2020   Procedure: Left Heart Cath;  Surgeon: Jettie Booze, MD;  Location: [facility];  Service:  Cardiovascular;  Laterality: N/A;   LEFT HEART CATH AND CORONARY ANGIOGRAPHY N/A 09/24/2020   Procedure: LEFT HEART CATH AND CORONARY ANGIOGRAPHY;  Surgeon: Jettie Booze, MD;  Location: [facility];  Service: Cardiovascular;  Laterality: N/A;   LEFT HEART CATH AND CORONARY ANGIOGRAPHY N/A 08/18/2021   Procedure: LEFT HEART CATH AND CORONARY ANGIOGRAPHY;  Surgeon: Troy Sine, MD;  Location: [facility];  Service: Cardiovascular;  Laterality: N/A;   LUMBAR FUSION  03/16/2019   SHOULDER SURGERY Left 2010   TOE SURGERY Left 2018   TONSILLECTOMY     as a child   TOTAL HIP ARTHROPLASTY Left 2011   UPPER GASTROINTESTINAL ENDOSCOPY  01/04/2020   VENTRICULOPERITONEAL SHUNT  2007   x2    Family History  Problem Relation Age of Onset   Other Mother    Lung cancer Father        alive, former smoker    Heart disease Brother        MI age 33   Other Brother        Murdered   Down syndrome Son    Diabetes Neg Hx    Prostate cancer Neg Hx    Colon cancer Neg Hx    Stomach cancer Neg Hx    Pancreatic cancer Neg Hx    Liver disease Neg Hx     Social History:  reports that he has never smoked. He has never used smokeless tobacco. He reports that he does not drink alcohol and does not use drugs.  Allergies:  Allergies  Allergen Reactions   Bee Venom Anaphylaxis   Hydrocodone Bit-Homatrop Mbr Other (See Comments)    Hallucinations, confusion, delirium Depressed feeling   Toradol [Ketorolac Tromethamine] Other (See Comments)    Hallucinations, confusion, delirium   Sulfadiazine     NDC IHWT:[id number] NDC PHXT:[id number] NDC PVVZ:[id number]   Morphine And Related Other (See Comments)    Hallucinations, back in the 80s. States has taken vicodin before w/o problems    Sulfa Drugs Cross Reactors Rash    Medications: I have reviewed the patient's current medications.  Results for orders placed or performed during the hospital encounter of 12/25/21 (from the  past 48 hour(s))  CBC with Differential     Status: None   Collection Time: 12/25/21  5:34 PM  Result Value Ref Range   WBC 10.0 4.0 - 10.5 K/uL   RBC 4.75 4.22 - 5.81 MIL/uL   Hemoglobin 14.2 13.0 - 17.0 g/dL   HCT 41.3 39.0 - 52.0 %   MCV 86.9 80.0 - 100.0 fL   MCH 29.9 26.0 - 34.0 pg   MCHC 34.4 30.0 - 36.0 g/dL   RDW 15.1 11.5 - 15.5 %   Platelets 159 150 - 400 K/uL   nRBC 0.0 0.0 - 0.2 %   Neutrophils Relative % 75 %   Neutro Abs 7.4 1.7 - 7.7 K/uL   Lymphocytes Relative 14 %  Lymphs Abs 1.4 0.7 - 4.0 K/uL   Monocytes Relative 10 %   Monocytes Absolute 1.0 0.1 - 1.0 K/uL   Eosinophils Relative 1 %   Eosinophils Absolute 0.1 0.0 - 0.5 K/uL   Basophils Relative 0 %   Basophils Absolute 0.0 0.0 - 0.1 K/uL   Immature Granulocytes 0 %   Abs Immature Granulocytes 0.04 0.00 - 0.07 K/uL    Comment: Performed at [facility], [address] [place], [place] [address]  Basic metabolic panel     Status: Abnormal   Collection Time: 12/25/21  5:34 PM  Result Value Ref Range   Sodium 135 135 - 145 mmol/L   Potassium 4.4 3.5 - 5.1 mmol/L   Chloride 105 98 - 111 mmol/L   CO2 23 22 - 32 mmol/L   Glucose, Bld 176 (H) 70 - 99 mg/dL    Comment: Glucose reference range applies only to samples taken after fasting for at least 8 hours.   BUN 25 (H) 8 - 23 mg/dL   Creatinine, Ser 1.80 (H) 0.61 - 1.24 mg/dL   Calcium 9.7 8.9 - 10.3 mg/dL   GFR, Estimated 42 (L) >60 mL/min    Comment: (NOTE) Calculated using the CKD-EPI Creatinine Equation (2021)    Anion gap 7 5 - 15    Comment: Performed at [facility], [address] [place], [place] [address]  Sedimentation rate     Status: Abnormal   Collection Time: 12/25/21  5:34 PM  Result Value Ref Range   Sed Rate 31 (H) 0 - 16 mm/hr    Comment: Performed at [facility], [address] [place], [place] [address]  C-reactive protein     Status: Abnormal   Collection Time: 12/25/21  5:35 PM  Result Value  Ref Range   CRP 1.7 (H) <1.0 mg/dL    Comment: Performed at [facility] [address], [place], [place] [address]  Lactic acid     Status: Abnormal   Collection Time: 12/25/21  5:43 PM  Result Value Ref Range   Lactic Acid, Venous 2.0 (HH) 0.5 - 1.9 mmol/L    Comment: CRITICAL RESULT CALLED TO, READ BACK BY AND VERIFIED WITH: Athena Masse RN AT 1828 ON 12/25/21 BY [email or web address] Performed at [facility], [address] [place], [place] [address]   Sedimentation rate     Status: Abnormal   Collection Time: 12/26/21  4:50 AM  Result Value Ref Range   Sed Rate 30 (H) 0 - 16 mm/hr    Comment: Performed at [facility], [place] [address] [place], [place] [address]  C-reactive protein     Status: Abnormal   Collection Time: 12/26/21  4:50 AM  Result Value Ref Range   CRP 5.4 (H) <1.0 mg/dL    Comment: Performed at [facility] [address], [place], [place] [address]  Prealbumin     Status: None   Collection Time: 12/26/21  4:50 AM  Result Value Ref Range   Prealbumin 19.4 18 - 38 mg/dL    Comment: Performed at [facility] [address] [place], [place] [address]  HIV Antibody (routine testing w rflx)     Status: None   Collection Time: 12/26/21  4:50 AM  Result Value Ref Range   HIV Screen 4th Generation wRfx Non Reactive Non Reactive    Comment: Performed at [facility],  [address], [place], [place] [address]  Basic metabolic panel     Status: Abnormal   Collection Time: 12/26/21  4:50 AM  Result Value Ref Range   Sodium 137 135 - 145 mmol/L   Potassium 3.9 3.5 - 5.1 mmol/L   Chloride 106 98 - 111 mmol/L   CO2 23 22 - 32 mmol/L   Glucose, Bld 127 (H) 70 - 99 mg/dL    Comment: Glucose reference range applies only to samples taken after fasting for at least 8 hours.   BUN 18 8 - 23 mg/dL   Creatinine, Ser 1.60 (H) 0.61 - 1.24 mg/dL   Calcium 9.6 8.9 - 10.3 mg/dL   GFR, Estimated 48 (L) >60 mL/min    Comment: (NOTE) Calculated  using the CKD-EPI Creatinine Equation (2021)    Anion gap 8 5 - 15    Comment: Performed at [facility] [address], [place], [place] [address]  CBC     Status: Abnormal   Collection Time: 12/26/21  4:50 AM  Result Value Ref Range   WBC 8.7 4.0 - 10.5 K/uL   RBC 4.64 4.22 - 5.81 MIL/uL   Hemoglobin 13.5 13.0 - 17.0 g/dL   HCT 40.7 39.0 - 52.0 %   MCV 87.7 80.0 - 100.0 fL   MCH 29.1 26.0 - 34.0 pg   MCHC 33.2 30.0 - 36.0 g/dL   RDW 15.2 11.5 - 15.5 %   Platelets 118 (L) 150 - 400 K/uL    Comment: REPEATED TO VERIFY   nRBC 0.0 0.0 - 0.2 %    Comment: Performed at [facility], [place] [address] [place], [place] [address]  Glucose, capillary     Status: Abnormal   Collection Time: 12/26/21  5:09 AM  Result Value Ref Range   Glucose-Capillary 132 (H) 70 - 99 mg/dL    Comment: Glucose reference range applies only to samples taken after fasting for at least 8 hours.  Surgical pcr screen     Status: Abnormal   Collection Time: 12/26/21  5:21 AM   Specimen: Nasal Mucosa; Nasal Swab  Result Value Ref Range   MRSA, PCR POSITIVE (A) NEGATIVE    Comment: RESULT CALLED TO, READ BACK BY AND VERIFIED WITH: RN KAY BROWN 12/26/21@6 :50 BY TW    Staphylococcus aureus POSITIVE (A) NEGATIVE    Comment: (NOTE) The Xpert SA Assay (FDA approved for NASAL specimens in patients 75 years of age and older), is one component of a comprehensive surveillance program. It is not intended to diagnose infection nor to guide or monitor treatment. Performed at [facility], [place] [address] [place], [place] [address]   Glucose, capillary     Status: Abnormal   Collection Time: 12/26/21  8:10 AM  Result Value Ref Range   Glucose-Capillary 140 (H) 70 - 99 mg/dL    Comment: Glucose reference range applies only to samples taken after fasting for at least 8 hours.  Glucose, capillary     Status: Abnormal   Collection Time: 12/26/21 12:15 PM  Result Value Ref Range   Glucose-Capillary 126  (H) 70 - 99 mg/dL    Comment: Glucose reference range applies only to samples taken after fasting for at least 8 hours.   *Note: Due to a large number of results and/or encounters for the requested time period, some results have not been displayed. A complete set of results can be found in Results Review.    DG Foot  Complete Left  Result Date: 12/25/2021 CLINICAL DATA:  Diabetic foot infection EXAM: LEFT FOOT - COMPLETE 3+ VIEW COMPARISON:  Foot x-rays August 04, 2016. FINDINGS: Erosive changes of the tuft of the first toe. Surrounding soft tissue swelling/ulcer. Milder erosive change along the lateral aspect of the distal first metatarsal. Remote trauma versus postsurgical change of the distal fifth digit. No evidence of acute fracture or joint malalignment. Vascular calcifications. Mild scattered degenerative change. IMPRESSION: 1. Erosive changes of the tuft of the first toe, suspicious for acute osteomyelitis given the clinical history and surrounding soft tissue swelling/ulcer. 2. Milder erosive change along the lateral aspect of the distal first metatarsal, which could represent age indeterminate sequela of osteomyelitis. 3. MRI could further evaluate if clinically warranted. Electronically Signed   By: Margaretha Sheffield M.D.   On: 12/25/2021 17:22    Review of Systems Blood pressure 127/82, pulse 83, temperature 99.9 F (37.7 C), resp. rate 17, height 5' 11"  (1.803 m), weight 113.4 kg, SpO2 93 %. Physical Exam General: AAO x3, NAD  Dermatological: On the left hallux there is edema and erythema present to the hallux IPJ distally.  Wound is present along the area of the toenail.  There is no purulence identified.  There is no ascending cellulitis.  He does get tenderness to palpation from the toe extending up to the midfoot along the area of cellulitis.    Vascular: Dorsalis Pedis artery and Posterior Tibial artery pedal pulses are palpable bilateral with immedate capillary fill time. There  is no pain with calf compression, swelling, warmth, erythema.   Neruologic: Sensation decreased.  Musculoskeletal: No other areas of discomfort.   Assessment/Plan: Osteomyelitis left foot  -X-rays reviewed which shows likely osteomyelitis of the distal phalanx.  There is some mild erosive changes along the lateral aspect of the distal first metatarsal which could represent osteomyelitis.  Due to this I ordered an MRI. I called radiology to make sure it was done tonight as it is pending surgery for tomorrow.  -I discussed with the patient surgical versus conservative treatment for osteomyelitis.  After discussion he does agree to proceed with surgery.  We will proceed with a toe amputation tomorrow pending MRI.  We discussed the surgery as well as postoperative course.  Alternatives, risks, complications were discussed.  We discussed risks of surgery including spread of infection, delayed/nonhealing, blood clots, bleeding, scarring, further surgery/more proximal amputation. -NPO after midnight -Continue IV antibiotics for now -Podiatry will continue to follow.  Trula Slade 12/26/2021, 1:06 PM

## 2021-12-26 NOTE — H&P (Signed)
History and Physical    James [name] WIO:[id number] DOB: 09-26-1955 DOA: 12/25/2021  PCP: Colon Branch, MD   Patient coming from: Home  Chief Complaint: Left first toe pain, redness, swelling   HPI: James [name] is a pleasant 66 y.o. male with medical history significant for coronary artery disease, cirrhosis, hypertension, OSA with CPAP intolerance, well-controlled type 2 diabetes mellitus, and CKD 3B, now presenting to the emergency department with pain, redness, and swelling involving the left first toe.  Patient reports that he had an ingrown toenail that he removed himself 2 weeks ago, and then noted this morning that the toe was red, sore, and swollen.  He denies fevers or chills.  [facility] ED Course: Upon arrival to the ED, patient is found to be afebrile and saturating well on room air with normal heart rate and stable blood pressure.  Chemistry panel notable for creatinine 1.80.  Plain radiographs were concerning for osteomyelitis of the toe and possibly the first metatarsal.  Blood cultures were collected and the patient was given IV fluids and empiric antibiotics.  Podiatry was consulted by the ED physician and patient was transferred to [facility] for admission.  Review of Systems:  All other systems reviewed and apart from HPI, are negative.  Past Medical History:  Diagnosis Date   Abnormal cardiac CT angiography    Acid reflux    Annual physical exam 04/08/2015   Arthritis    Atypical chest pain 06/13/2020   Blood transfusion without reported diagnosis    Body mass index (BMI) 35.0-35.9, adult 04/05/2019   Chronic fatigue 01/28/2015   Chronic headaches    on cymbalta   Chronic migraine w/o aura, not intractable, w/o stat migr 10/24/2018   Cirrhosis ([place])    Colon polyps    Complication of anesthesia    problems waking up from anesthesia   Coronary artery disease 01/23/2019   Depression    on cymbalta   Diabetes mellitus with neuropathy ([place])    Diabetes with  neuropathy 04/25/2013   Diverticulitis 03/2013   Dyslipidemia 05/29/2019   Eczema    Elevated LFTs    Epidural lipomatosis 10/05/2018   Essential (primary) hypertension 04/05/2019   Essential hypertension 10/10/2019   Fatty liver    GERD (gastroesophageal reflux disease) 04/28/2011   H/O craniotomy 05/07/2015   Headache 04/28/2011   Hepatitis 10/2017   NASH cirrhosis   History of kidney stones    Hyperlipidemia    Hypersomnia with sleep apnea 01/28/2015   Hypertension    IDA (iron deficiency anemia) 01/24/2019   Idiopathic intracranial hypertension 01/14/2017   Insomnia 04/26/2013   Kidney stone    Liver cirrhosis secondary to NASH (nonalcoholic steatohepatitis) ([place]) 01/02/2016   Low back pain 04/05/2019   Lower back injury 08/14/2019   Morbid obesity ([place])    Neuromuscular disorder ([place])    neuropathy   Neuropathy    Nonalcoholic steatohepatitis 09/28/6832   Obstructive hydrocephalus ([place]) 01/28/2015   OSA -- dx ~ 2012, cpap intolerant 09/04/2014    dx ~ 2012, cpap intolerant    PCP NOTES >>> 04/08/2015   Post-op pain 03/19/2019   Post-traumatic hydrocephalus (HCC)    s/p shunts x 2 (first got infected )   Presence of cerebrospinal fluid drainage device 07/28/2011   Psoriasis    sees Dr [name]   Psoriatic arthritis ([place])    REM behavioral disorder 01/14/2017   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   see's Dr.Ortmann   Severe  obesity (BMI >= 40) ([place]) 01/28/2015   SI (sacroiliac) joint dysfunction 08/14/2019   Sigmoid diverticulitis 04/25/2013   Sleep apnea    no CPAP      Spondylolisthesis, lumbar region 03/16/2019   Stomach ulcer    Testosterone deficiency 04/28/2011   VP (ventriculoperitoneal) shunt status 07/31/2020    Past Surgical History:  Procedure Laterality Date   [place]     VP shunts placed in 2007   CHOLECYSTECTOMY N/A 08/25/2017   Procedure: LAPAROSCOPIC CHOLECYSTECTOMY WITH INTRAOPERATIVE CHOLANGIOGRAM;  Surgeon: [name], MD;  Location: [place];  Service: General;  Laterality: N/A;   COLONOSCOPY     CORONARY STENT INTERVENTION N/A 09/27/2020   Procedure: CORONARY STENT INTERVENTION;  Surgeon: Jettie Booze, MD;  Location: [place] [facility];  Service: Cardiovascular;  Laterality: N/A;   INTRAVASCULAR ULTRASOUND/IVUS N/A 09/27/2020   Procedure: Intravascular Ultrasound/IVUS;  Surgeon: Jettie Booze, MD;  Location: [place] [facility];  Service: Cardiovascular;  Laterality: N/A;   JOINT REPLACEMENT     total hip   LEFT HEART CATH N/A 09/27/2020   Procedure: Left Heart Cath;  Surgeon: Jettie Booze, MD;  Location: [place] [facility];  Service: Cardiovascular;  Laterality: N/A;   LEFT HEART CATH AND CORONARY ANGIOGRAPHY N/A 09/24/2020   Procedure: LEFT HEART CATH AND CORONARY ANGIOGRAPHY;  Surgeon: Jettie Booze, MD;  Location: [place] [facility];  Service: Cardiovascular;  Laterality: N/A;   LEFT HEART CATH AND CORONARY ANGIOGRAPHY N/A 08/18/2021   Procedure: LEFT HEART CATH AND CORONARY ANGIOGRAPHY;  Surgeon: Troy Sine, MD;  Location: [place] [facility];  Service: Cardiovascular;  Laterality: N/A;   LUMBAR FUSION  03/16/2019   SHOULDER SURGERY Left 2010   TOE SURGERY Left 2018   TONSILLECTOMY     as a child   TOTAL HIP ARTHROPLASTY Left 2011   UPPER GASTROINTESTINAL ENDOSCOPY  01/04/2020   VENTRICULOPERITONEAL SHUNT  2007   x2    Social History:   reports that he has never smoked. He has never used smokeless tobacco. He reports that he does not drink alcohol and does not use drugs.  Allergies  Allergen Reactions   Bee Venom Anaphylaxis   Hydrocodone Bit-Homatrop Mbr Other (See Comments)    Hallucinations, confusion, delirium Depressed feeling   Toradol [Ketorolac Tromethamine] Other (See Comments)    Hallucinations, confusion, delirium   Sulfadiazine     NDC LZJQ:[id number] NDC OXBD:[id number] NDC MHDQ:[id number]   Morphine And Related Other (See Comments)     Hallucinations, back in the 80s. States has taken vicodin before w/o problems    Sulfa Drugs Cross Reactors Rash    Family History  Problem Relation Age of Onset   Other Mother    Lung cancer Father        alive, former smoker    Heart disease Brother        MI age 55   Other Brother        Murdered   Down syndrome Son    Diabetes Neg Hx    Prostate cancer Neg Hx    Colon cancer Neg Hx    Stomach cancer Neg Hx    Pancreatic cancer Neg Hx    Liver disease Neg Hx      Prior to Admission medications   Medication Sig Start Date End Date Taking? Authorizing Provider  acetaminophen (TYLENOL) 325 MG tablet Take 2 tablets (650 mg total) by mouth every  4 (four) hours as needed for headache or mild pain. 08/18/21   Isaiah Serge, NP  Adalimumab (HUMIRA PEN) 40 MG/0.8ML PNKT INJECT 40 MG (0.8 ML) UNDER THE SKIN EVERY OTHER WEEK. 12/08/21   Tresa Garter, MD  amLODipine (NORVASC) 5 MG tablet Take 1 tablet (5 mg total) by mouth daily. 12/15/21   Park Liter, MD  amoxicillin-clavulanate (AUGMENTIN) 875-125 MG tablet Take 1 tablet by mouth 2 (two) times daily. 10/24/21   Colon Branch, MD  aspirin 81 MG chewable tablet Chew 1 tablet (81 mg total) by mouth daily. 08/19/21   Isaiah Serge, NP  atorvastatin (LIPITOR) 80 MG tablet Take 1 tablet (80 mg total) by mouth at bedtime. 09/19/21   Colon Branch, MD  carvedilol (COREG) 12.5 MG tablet TAKE 1 TABLET (12.5 MG TOTAL) BY MOUTH 2 (TWO) TIMES DAILY WITH A MEAL. Patient taking differently: Take 12.5 mg by mouth 2 (two) times daily with a meal. 08/04/21 08/04/22  Colon Branch, MD  clonazePAM (KLONOPIN) 0.5 MG tablet TAKE 1 TABLET BY MOUTH EVERY NIGHT AT BEDTIME 12/16/21 06/14/22  Sater, Nanine Means, MD  clopidogrel (PLAVIX) 75 MG tablet TAKE 1 TABLET (75 MG TOTAL) BY MOUTH DAILY. 12/16/21 12/16/22  Park Liter, MD  Continuous Blood Gluc Sensor (FREESTYLE LIBRE 14 DAY SENSOR) MISC Apply 1 sensor every 14 (fourteen) days. 12/16/21   Elayne Snare,  MD  DULoxetine (CYMBALTA) 60 MG capsule TAKE 2 CAPSULES (120 MG TOTAL) BY MOUTH DAILY. Patient taking differently: Take 60 mg by mouth daily. 09/19/21   Colon Branch, MD  EPINEPHrine (EPIPEN 2-PAK) 0.3 mg/0.3 mL IJ SOAJ injection Inject 0.3 mLs (0.3 mg total) into the muscle once as needed for up to 1 dose for anaphylaxis. 01/10/20   Colon Branch, MD  fenofibrate micronized (LOFIBRA) 134 MG capsule TAKE 1 CAPSULE BY MOUTH THREE TIMES WEEKLY Patient taking differently: Take 134 mg by mouth daily before breakfast. 10/22/21 10/22/22  Elayne Snare, MD  gabapentin (NEURONTIN) 600 MG tablet TAKE 1 TABLET (600 MG TOTAL) BY MOUTH 2 (TWO) TIMES DAILY. Patient taking differently: Take 600 mg by mouth 2 (two) times daily. 07/24/21 07/24/22  Colon Branch, MD  glimepiride (AMARYL) 4 MG tablet TAKE 1 TABLET BY MOUTH ONCE DAILY BEFORE BREAKFAST Patient taking differently: Take 4 mg by mouth daily before breakfast. 11/19/21 11/19/22  Elayne Snare, MD  isosorbide mononitrate (IMDUR) 60 MG 24 hr tablet Take 1 tablet (60 mg total) by mouth daily. 08/19/21   Isaiah Serge, NP  metFORMIN (GLUCOPHAGE) 500 MG tablet Take 2 tablets (1,000 mg total) by mouth 2 (two) times daily. 08/21/21   Isaiah Serge, NP  nitroGLYCERIN (NITROSTAT) 0.4 MG SL tablet Place 1 tablet (0.4 mg total) under the tongue every 5 (five) minutes for 3 doses as needed for chest pain. 06/26/21   Colon Branch, MD  pantoprazole (PROTONIX) 40 MG tablet Take 1 tablet (40 mg total) by mouth daily. 12/15/21   Colon Branch, MD  Semaglutide, 2 MG/DOSE, (OZEMPIC, 2 MG/DOSE,) 8 MG/3ML SOPN Inject 2 mg into the skin weekly Patient taking differently: Inject 2 mg into the skin once a week. Inject 2 mg into the skin weekly 11/04/21   Elayne Snare, MD    Physical Exam: Vitals:   12/25/21 1821 12/25/21 1830 12/25/21 2000 12/25/21 2138  BP: 139/79 137/80 139/84 (!) 141/87  Pulse: 84 85 78 78  Resp: 20 20 (!) 21   Temp:  TempSrc:      SpO2: 96% 94% 96% 97%  Weight:       Height:        Constitutional: NAD, calm  Eyes: PERTLA, lids and conjunctivae normal ENMT: Mucous membranes are moist. Posterior pharynx clear of any exudate or lesions.   Neck: supple, no masses  Respiratory: no wheezing, no crackles. No accessory muscle use.  Cardiovascular: S1 & S2 heard, regular rate and rhythm. No extremity edema.   Abdomen: No distension, no tenderness, soft. Bowel sounds active.  Musculoskeletal: no clubbing / cyanosis. No joint deformity upper and lower extremities.   Skin: Swelling, erythema, and tenderness involving left great toe. Warm, dry, well-perfused. Neurologic: CN 2-12 grossly intact. Moving all extremities. Alert and oriented.  Psychiatric: Pleasant. Cooperative.    Labs and Imaging on Admission: I have personally reviewed following labs and imaging studies  CBC: Recent Labs  Lab 12/25/21 1734  WBC 10.0  NEUTROABS 7.4  HGB 14.2  HCT 41.3  MCV 86.9  PLT 016   Basic Metabolic Panel: Recent Labs  Lab 12/25/21 1734  NA 135  K 4.4  CL 105  CO2 23  GLUCOSE 176*  BUN 25*  CREATININE 1.80*  CALCIUM 9.7   GFR: Estimated Creatinine Clearance: 53.8 mL/min (A) (by C-G formula based on SCr of 1.8 mg/dL (H)). Liver Function Tests: No results for input(s): "AST", "ALT", "ALKPHOS", "BILITOT", "PROT", "ALBUMIN" in the last 168 hours. No results for input(s): "LIPASE", "AMYLASE" in the last 168 hours. No results for input(s): "AMMONIA" in the last 168 hours. Coagulation Profile: No results for input(s): "INR", "PROTIME" in the last 168 hours. Cardiac Enzymes: No results for input(s): "CKTOTAL", "CKMB", "CKMBINDEX", "TROPONINI" in the last 168 hours. BNP (last 3 results) No results for input(s): "PROBNP" in the last 8760 hours. HbA1C: No results for input(s): "HGBA1C" in the last 72 hours. CBG: Recent Labs  Lab 12/26/21 0509  GLUCAP 132*   Lipid Profile: No results for input(s): "CHOL", "HDL", "LDLCALC", "TRIG", "CHOLHDL", "LDLDIRECT"  in the last 72 hours. Thyroid Function Tests: No results for input(s): "TSH", "T4TOTAL", "FREET4", "T3FREE", "THYROIDAB" in the last 72 hours. Anemia Panel: No results for input(s): "VITAMINB12", "FOLATE", "FERRITIN", "TIBC", "IRON", "RETICCTPCT" in the last 72 hours. Urine analysis:    Component Value Date/Time   COLORURINE YELLOW 10/24/2021 [place] 10/24/2021 0954   LABSPEC >=1.030 (A) 10/24/2021 0954   PHURINE 5.5 10/24/2021 [place] 10/24/2021 0954   HGBUR NEGATIVE 10/24/2021 0954   BILIRUBINUR NEGATIVE 10/24/2021 0954   BILIRUBINUR Negative 08/02/2018 1154   KETONESUR NEGATIVE 10/24/2021 0954   PROTEINUR NEGATIVE 01/21/2019 1646   UROBILINOGEN 0.2 10/24/2021 0954   NITRITE NEGATIVE 10/24/2021 0954   LEUKOCYTESUR NEGATIVE 10/24/2021 0954   Sepsis Labs: @LABRCNTIP (procalcitonin:4,lacticidven:4) )No results found for this or any previous visit (from the past 240 hour(s)).   Radiological Exams on Admission: DG Foot Complete Left  Result Date: 12/25/2021 CLINICAL DATA:  Diabetic foot infection EXAM: LEFT FOOT - COMPLETE 3+ VIEW COMPARISON:  Foot x-rays August 04, 2016. FINDINGS: Erosive changes of the tuft of the first toe. Surrounding soft tissue swelling/ulcer. Milder erosive change along the lateral aspect of the distal first metatarsal. Remote trauma versus postsurgical change of the distal fifth digit. No evidence of acute fracture or joint malalignment. Vascular calcifications. Mild scattered degenerative change. IMPRESSION: 1. Erosive changes of the tuft of the first toe, suspicious for acute osteomyelitis given the clinical history and surrounding soft tissue swelling/ulcer. 2.  Milder erosive change along the lateral aspect of the distal first metatarsal, which could represent age indeterminate sequela of osteomyelitis. 3. MRI could further evaluate if clinically warranted. Electronically Signed   By: Margaretha Sheffield M.D.   On: 12/25/2021  17:22     Assessment/Plan   1. Left 1st toe osteomyelitis  - Presents with left great toe pain, swelling, and redness after removing his ingrown nail 2 weeks ago  - Plain radiographs concerning for osteomyelitis  - He is not septic on admission - Podiatry was consulted by ED, blood cultures were collected, and antibiotics were started  - Continue antibiotics, follow-up podiatry recommendations    2. AKI on CKD IIIa - SCr is 1.80 on admission, up from 1.4 - He was given IVF in ED  - Continue IVF hydration, renally-dose medications, repeat chem panel   3. CAD  - Stent placed in April 2022  - No anginal complaints  - Continue Lipitor, hold Plavix pending podiatry consult    4. Hypertension  - Continue Norvasc and Coreg    5. Type II DM  - A1c was 5.9% in May 2023  - Check CBGs and use low-intensity SSI if needed    6. Cirrhosis  - Appears compensated     DVT prophylaxis: SCDs  Code Status: Full  Level of Care: Level of care: Med-Surg Family Communication: none present  Disposition Plan:  Patient is from: home  Anticipated d/c is to: home  Anticipated d/c date is: 12/28/21  Patient currently: Pending podiatry consultation  Consults called: Podiatry  Admission status: Inpatient     Vianne Bulls, MD [facility]  12/26/2021, 5:19 AM

## 2021-12-26 NOTE — Progress Notes (Signed)
Pharmacy Antibiotic Note  James [name] is a 66 y.o. male for which pharmacy has been consulted for Vancomycin dosing for  suspected diabetic foot infection .  Plan: Vancomycin 2000 mg IV now, then Vancomycin 1500 mg IV q24h  Height: 5' 11"  (180.3 cm) Weight: 113.4 kg (250 lb) IBW/kg (Calculated) : 75.3  Temp (24hrs), Avg:97.9 F (36.6 C), Min:97.9 F (36.6 C), Max:97.9 F (36.6 C)  Recent Labs  Lab 12/25/21 1734 12/25/21 1743  WBC 10.0  --   CREATININE 1.80*  --   LATICACIDVEN  --  2.0*     Estimated Creatinine Clearance: 53.8 mL/min (A) (by C-G formula based on SCr of 1.8 mg/dL (H)).    Allergies  Allergen Reactions   Bee Venom Anaphylaxis   Hydrocodone Bit-Homatrop Mbr Other (See Comments)    Hallucinations, confusion, delirium Depressed feeling   Toradol [Ketorolac Tromethamine] Other (See Comments)    Hallucinations, confusion, delirium   Sulfadiazine     NDC TMBP:[id number] NDC QHKU:[id number] NDC IPPG:[id number]   Morphine And Related Other (See Comments)    Hallucinations, back in the 80s. States has taken vicodin before w/o problems    Sulfa Drugs Cross Reactors Rash   Antimicrobials this admission: cefepime 6/29 >>  metronidazole 6/29 >>   Phillis Knack, PharmD, BCPS

## 2021-12-26 NOTE — Progress Notes (Signed)
PHARMACY - PHYSICIAN COMMUNICATION CRITICAL VALUE ALERT - BLOOD CULTURE IDENTIFICATION (BCID)  James [name] is an 66 y.o. male who presented to [facility] on 12/25/2021 with infection of the left toe (concern for osteomyelitis)  Assessment:  Blood cultures show GPC in clusters in 2/4 bottles and BCID with staph aureus with no resistance  Name of physician (or Provider) Contacted: Dr. Eliseo Squires  Current antibiotics: Dr. Eliseo Squires  Changes to prescribed antibiotics recommended: Change to ancef 2gm IV q8h Recommendations accepted by provider  Results for orders placed or performed during the hospital encounter of 12/25/21  Blood Culture ID Panel (Reflexed) (Collected: 12/25/2021  5:48 PM)  Result Value Ref Range   Enterococcus faecalis NOT DETECTED NOT DETECTED   Enterococcus Faecium NOT DETECTED NOT DETECTED   Listeria monocytogenes NOT DETECTED NOT DETECTED   Staphylococcus species DETECTED (A) NOT DETECTED   Staphylococcus aureus (BCID) DETECTED (A) NOT DETECTED   Staphylococcus epidermidis NOT DETECTED NOT DETECTED   Staphylococcus lugdunensis NOT DETECTED NOT DETECTED   Streptococcus species NOT DETECTED NOT DETECTED   Streptococcus agalactiae NOT DETECTED NOT DETECTED   Streptococcus pneumoniae NOT DETECTED NOT DETECTED   Streptococcus pyogenes NOT DETECTED NOT DETECTED   A.calcoaceticus-baumannii NOT DETECTED NOT DETECTED   Bacteroides fragilis NOT DETECTED NOT DETECTED   Enterobacterales NOT DETECTED NOT DETECTED   Enterobacter cloacae complex NOT DETECTED NOT DETECTED   Escherichia coli NOT DETECTED NOT DETECTED   Klebsiella aerogenes NOT DETECTED NOT DETECTED   Klebsiella oxytoca NOT DETECTED NOT DETECTED   Klebsiella pneumoniae NOT DETECTED NOT DETECTED   Proteus species NOT DETECTED NOT DETECTED   Salmonella species NOT DETECTED NOT DETECTED   Serratia marcescens NOT DETECTED NOT DETECTED   Haemophilus influenzae NOT DETECTED NOT DETECTED   Neisseria meningitidis NOT DETECTED  NOT DETECTED   Pseudomonas aeruginosa NOT DETECTED NOT DETECTED   Stenotrophomonas maltophilia NOT DETECTED NOT DETECTED   Candida albicans NOT DETECTED NOT DETECTED   Candida auris NOT DETECTED NOT DETECTED   Candida glabrata NOT DETECTED NOT DETECTED   Candida krusei NOT DETECTED NOT DETECTED   Candida parapsilosis NOT DETECTED NOT DETECTED   Candida tropicalis NOT DETECTED NOT DETECTED   Cryptococcus neoformans/gattii NOT DETECTED NOT DETECTED   Meth resistant mecA/C and MREJ NOT DETECTED NOT DETECTED   Hildred Laser, PharmD Clinical Pharmacist **Pharmacist phone directory can now be found on [email or web address] ([email or web address]).  Listed under [place].

## 2021-12-26 NOTE — Progress Notes (Addendum)
Initial Nutrition Assessment  DOCUMENTATION CODES:   Not applicable  INTERVENTION:   Prosource Plus 30 ml PO BID, each packet provides 100 kcal and 15 gm protein  MVI with minerals daily  NUTRITION DIAGNOSIS:   Increased nutrient needs related to wound healing as evidenced by estimated needs.  GOAL:   Patient will meet greater than or equal to 90% of their needs  MONITOR:   PO intake, Supplement acceptance, Labs, Skin  REASON FOR ASSESSMENT:   Consult Wound healing  ASSESSMENT:   66 yo male admitted with diabetic foot infection, L great toe osteomyelitis. PMH includes HTN, diverticulitis, fatty liver, neuropathy, DM, NASH cirrhosis, HLD, OSA.  Patient reports that he has been eating okay PTA at home. No weight changes recently. He usually eats one meal per day (dinner) and a snack later in the day. Discussed the importance of eating 3 meals per day for optimal glucose control. We reviewed good sources of protein to include in his diet to support wound healing. He agreed to try Prosource Plus protein supplement with meals BID. He declines other PO supplements at this time. Will also add a MVI with minerals daily to support wound healing.   Currently NPO for procedure today.  Labs reviewed.  CBG: 132-140  Medications reviewed and include Novolog, Protonix.  Weight history reviewed.  No significant weight changes noted over the past 4 months.  NUTRITION - FOCUSED PHYSICAL EXAM:  Flowsheet Row Most Recent Value  Orbital Region No depletion  Upper Arm Region No depletion  Thoracic and Lumbar Region No depletion  Buccal Region No depletion  Temple Region No depletion  Clavicle Bone Region No depletion  Clavicle and Acromion Bone Region No depletion  Scapular Bone Region No depletion  Dorsal Hand Mild depletion  Patellar Region Unable to assess  Anterior Thigh Region Unable to assess  Posterior Calf Region No depletion  Edema (RD Assessment) Mild  Hair Reviewed   Eyes Reviewed  Mouth Reviewed  Skin Reviewed  Nails Reviewed       Diet Order:   Diet Order             Diet NPO time specified Except for: Ice Chips, Sips with Meds  Diet effective midnight                   EDUCATION NEEDS:   Education needs have been addressed  Skin:  Skin Assessment: Skin Integrity Issues: Skin Integrity Issues:: Diabetic Ulcer Diabetic Ulcer: osteomyelitis of great toe of L foot  Last BM:  6/28  Height:   Ht Readings from Last 1 Encounters:  12/25/21 5' 11"  (1.803 m)    Weight:   Wt Readings from Last 1 Encounters:  12/25/21 113.4 kg    Ideal Body Weight:  78.2 kg  BMI:  Body mass index is 34.87 kg/m.  Estimated Nutritional Needs:   Kcal:  2200-2400  Protein:  125-145 gm  Fluid:  2.2-2.4 L    Lucas Mallow RD, LDN, CNSC Please refer to Amion for contact information.

## 2021-12-26 NOTE — Plan of Care (Signed)

## 2021-12-26 NOTE — Progress Notes (Signed)
PROGRESS NOTE    [name]  MKL:[id number] DOB: 1956-01-18 DOA: 12/25/2021 PCP: Colon Branch, MD    Brief Narrative:   James [name] is a pleasant 66 y.o. male with medical history significant for coronary artery disease, cirrhosis, hypertension, OSA with CPAP intolerance, well-controlled type 2 diabetes mellitus, and CKD 3B, now presenting to the emergency department with pain, redness, and swelling involving the left first toe.  Patient reports that he had an ingrown toenail that he removed himself 2 weeks ago, and then noted this morning that the toe was red, sore, and swollen.  He denies fevers or chills  Assessment and Plan:  Left 1st toe osteomyelitis  - Presents with left great toe pain, swelling, and redness after removing his ingrown nail 2 weeks ago  - Plain radiographs concerning for osteomyelitis  - He is not septic on admission - Podiatry was consulted by ED, blood cultures were collected, and antibiotics were started  - Continue antibiotics, follow-up podiatry recommendations      AKI on CKD IIIa - SCr is 1.80 on admission, up from 1.4 - improved with IVF   CAD  - Stent placed in April 2022  - No anginal complaints  - Continue Lipitor, hold Plavix pending podiatry consult     Hypertension  - Continue Norvasc and Coreg     type II DM  - A1c was 5.9% in May 2023     Cirrhosis  - Appears compensated    Obesity Estimated body mass index is 34.87 kg/m as calculated from the following:   Height as of this encounter: 5' 11"  (1.803 m).   Weight as of this encounter: 113.4 kg.   DVT prophylaxis: heparin injection 5,000 Units Start: 12/26/21 2200 SCDs Start: 12/26/21 0433    Code Status: Full Code Family Communication:   Disposition Plan:  Level of care: Med-Surg Status is: Inpatient Remains inpatient appropriate because: podiatry eval    Consultants:  podiatry   Subjective: Pain controlled-- has neuropathy in legs  Objective: Vitals:    12/25/21 2000 12/25/21 2138 12/26/21 0538 12/26/21 0814  BP: 139/84 (!) 141/87 135/81 127/82  Pulse: 78 78 80 83  Resp: (!) 21  18 17   Temp:   (!) 100.8 F (38.2 C) 99.9 F (37.7 C)  TempSrc:   Oral   SpO2: 96% 97% 92% 93%  Weight:      Height:        Intake/Output Summary (Last 24 hours) at 12/26/2021 1247 Last data filed at 12/26/2021 0900 Gross per 24 hour  Intake 661.93 ml  Output --  Net 661.93 ml   Filed Weights   12/25/21 1648  Weight: 113.4 kg    Examination:   General: Appearance:    Obese male in no acute distress     Lungs:      respirations unlabored  Heart:    Normal heart rate.    MS:   All extremities are intact.    Neurologic:   Awake, alert, oriented x 3. No apparent focal neurological           defect.        Data Reviewed: I have personally reviewed following labs and imaging studies  CBC: Recent Labs  Lab 12/25/21 1734 12/26/21 0450  WBC 10.0 8.7  NEUTROABS 7.4  --   HGB 14.2 13.5  HCT 41.3 40.7  MCV 86.9 87.7  PLT 159 697*   Basic Metabolic Panel: Recent Labs  Lab 12/25/21 1734 12/26/21  0450  NA 135 137  K 4.4 3.9  CL 105 106  CO2 23 23  GLUCOSE 176* 127*  BUN 25* 18  CREATININE 1.80* 1.60*  CALCIUM 9.7 9.6   GFR: Estimated Creatinine Clearance: 60.5 mL/min (A) (by C-G formula based on SCr of 1.6 mg/dL (H)). Liver Function Tests: No results for input(s): "AST", "ALT", "ALKPHOS", "BILITOT", "PROT", "ALBUMIN" in the last 168 hours. No results for input(s): "LIPASE", "AMYLASE" in the last 168 hours. No results for input(s): "AMMONIA" in the last 168 hours. Coagulation Profile: No results for input(s): "INR", "PROTIME" in the last 168 hours. Cardiac Enzymes: No results for input(s): "CKTOTAL", "CKMB", "CKMBINDEX", "TROPONINI" in the last 168 hours. BNP (last 3 results) No results for input(s): "PROBNP" in the last 8760 hours. HbA1C: No results for input(s): "HGBA1C" in the last 72 hours. CBG: Recent Labs  Lab  12/26/21 0509 12/26/21 0810 12/26/21 1215  GLUCAP 132* 140* 126*   Lipid Profile: No results for input(s): "CHOL", "HDL", "LDLCALC", "TRIG", "CHOLHDL", "LDLDIRECT" in the last 72 hours. Thyroid Function Tests: No results for input(s): "TSH", "T4TOTAL", "FREET4", "T3FREE", "THYROIDAB" in the last 72 hours. Anemia Panel: No results for input(s): "VITAMINB12", "FOLATE", "FERRITIN", "TIBC", "IRON", "RETICCTPCT" in the last 72 hours. Sepsis Labs: Recent Labs  Lab 12/25/21 1743  LATICACIDVEN 2.0*    Recent Results (from the past 240 hour(s))  Surgical pcr screen     Status: Abnormal   Collection Time: 12/26/21  5:21 AM   Specimen: Nasal Mucosa; Nasal Swab  Result Value Ref Range Status   MRSA, PCR POSITIVE (A) NEGATIVE Final    Comment: RESULT CALLED TO, READ BACK BY AND VERIFIED WITH: RN KAY BROWN 12/26/21@6 :50 BY TW    Staphylococcus aureus POSITIVE (A) NEGATIVE Final    Comment: (NOTE) The Xpert SA Assay (FDA approved for NASAL specimens in patients 7 years of age and older), is one component of a comprehensive surveillance program. It is not intended to diagnose infection nor to guide or monitor treatment. Performed at [facility], [place] [address], [place], [place] [address]          Radiology Studies: DG Foot Complete Left  Result Date: 12/25/2021 CLINICAL DATA:  Diabetic foot infection EXAM: LEFT FOOT - COMPLETE 3+ VIEW COMPARISON:  Foot x-rays August 04, 2016. FINDINGS: Erosive changes of the tuft of the first toe. Surrounding soft tissue swelling/ulcer. Milder erosive change along the lateral aspect of the distal first metatarsal. Remote trauma versus postsurgical change of the distal fifth digit. No evidence of acute fracture or joint malalignment. Vascular calcifications. Mild scattered degenerative change. IMPRESSION: 1. Erosive changes of the tuft of the first toe, suspicious for acute osteomyelitis given the clinical history and surrounding soft tissue  swelling/ulcer. 2. Milder erosive change along the lateral aspect of the distal first metatarsal, which could represent age indeterminate sequela of osteomyelitis. 3. MRI could further evaluate if clinically warranted. Electronically Signed   By: Margaretha Sheffield M.D.   On: 12/25/2021 17:22        Scheduled Meds:  (feeding supplement) PROSource Plus  30 mL Oral BID   amLODipine  5 mg Oral Daily   atorvastatin  80 mg Oral QHS   carvedilol  12.5 mg Oral BID WC   Chlorhexidine Gluconate Cloth  6 each Topical Q0600   clonazePAM  0.5 mg Oral QHS   DULoxetine  60 mg Oral QHS   gabapentin  300 mg Oral BID   heparin injection (subcutaneous)  5,000  Units Subcutaneous Q8H   insulin aspart  0-6 Units Subcutaneous Q4H   isosorbide mononitrate  60 mg Oral Daily   multivitamin with minerals  1 tablet Oral Daily   mupirocin ointment  1 Application Nasal BID   pantoprazole  40 mg Oral Daily   Continuous Infusions:  sodium chloride 20 mL/hr at 12/25/21 1907   sodium chloride 85 mL/hr at 12/26/21 0516   ceFEPime (MAXIPIME) IV 2 g (12/26/21 1153)   metronidazole 500 mg (12/26/21 0517)   [START ON 12/27/2021] vancomycin       LOS: 1 day    Time spent: 45 minutes spent on chart review, discussion with nursing staff, consultants, updating family and interview/physical exam; more than 50% of that time was spent in counseling and/or coordination of care.    Geradine Girt, DO [facility] Available via Epic secure chat 7am-7pm After these hours, please refer to coverage provider listed on [email or web address] 12/26/2021, 12:47 PM

## 2021-12-27 ENCOUNTER — Encounter (HOSPITAL_COMMUNITY)
Admission: EM | Disposition: A | Discharge status: Home / Self Care | Payer: Self-pay | Source: Home / Self Care | Attending: Internal Medicine

## 2021-12-27 ENCOUNTER — Inpatient Hospital Stay (HOSPITAL_COMMUNITY): Payer: 59

## 2021-12-27 ENCOUNTER — Inpatient Hospital Stay (HOSPITAL_COMMUNITY): Payer: 59 | Admitting: Certified Registered Nurse Anesthetist

## 2021-12-27 ENCOUNTER — Encounter (HOSPITAL_COMMUNITY): Payer: Self-pay | Admitting: Family Medicine

## 2021-12-27 DIAGNOSIS — M7989 Other specified soft tissue disorders: Secondary | ICD-10-CM | POA: Diagnosis not present

## 2021-12-27 DIAGNOSIS — G4730 Sleep apnea, unspecified: Secondary | ICD-10-CM

## 2021-12-27 DIAGNOSIS — E1122 Type 2 diabetes mellitus with diabetic chronic kidney disease: Secondary | ICD-10-CM | POA: Diagnosis not present

## 2021-12-27 DIAGNOSIS — R6520 Severe sepsis without septic shock: Secondary | ICD-10-CM

## 2021-12-27 DIAGNOSIS — I10 Essential (primary) hypertension: Secondary | ICD-10-CM

## 2021-12-27 DIAGNOSIS — B9561 Methicillin susceptible Staphylococcus aureus infection as the cause of diseases classified elsewhere: Secondary | ICD-10-CM

## 2021-12-27 DIAGNOSIS — N179 Acute kidney failure, unspecified: Secondary | ICD-10-CM | POA: Diagnosis not present

## 2021-12-27 DIAGNOSIS — I2510 Atherosclerotic heart disease of native coronary artery without angina pectoris: Secondary | ICD-10-CM | POA: Diagnosis not present

## 2021-12-27 DIAGNOSIS — M86672 Other chronic osteomyelitis, left ankle and foot: Secondary | ICD-10-CM

## 2021-12-27 DIAGNOSIS — M86072 Acute hematogenous osteomyelitis, left ankle and foot: Secondary | ICD-10-CM | POA: Diagnosis not present

## 2021-12-27 DIAGNOSIS — A4101 Sepsis due to Methicillin susceptible Staphylococcus aureus: Secondary | ICD-10-CM

## 2021-12-27 DIAGNOSIS — R7881 Bacteremia: Secondary | ICD-10-CM

## 2021-12-27 HISTORY — PX: AMPUTATION: SHX166

## 2021-12-27 LAB — CBC
HCT: 36.2 % — ABNORMAL LOW (ref 39.0–52.0)
Hemoglobin: 12.3 g/dL — ABNORMAL LOW (ref 13.0–17.0)
MCH: 29.9 pg (ref 26.0–34.0)
MCHC: 34 g/dL (ref 30.0–36.0)
MCV: 87.9 fL (ref 80.0–100.0)
Platelets: 125 10*3/uL — ABNORMAL LOW (ref 150–400)
RBC: 4.12 MIL/uL — ABNORMAL LOW (ref 4.22–5.81)
RDW: 15.1 % (ref 11.5–15.5)
WBC: 8.3 10*3/uL (ref 4.0–10.5)
nRBC: 0 % (ref 0.0–0.2)

## 2021-12-27 LAB — BASIC METABOLIC PANEL
Anion gap: 7 (ref 5–15)
BUN: 17 mg/dL (ref 8–23)
CO2: 22 mmol/L (ref 22–32)
Calcium: 9.5 mg/dL (ref 8.9–10.3)
Chloride: 108 mmol/L (ref 98–111)
Creatinine, Ser: 1.56 mg/dL — ABNORMAL HIGH (ref 0.61–1.24)
GFR, Estimated: 50 mL/min — ABNORMAL LOW (ref >60)
Glucose, Bld: 125 mg/dL — ABNORMAL HIGH (ref 70–99)
Potassium: 4.2 mmol/L (ref 3.5–5.1)
Sodium: 137 mmol/L (ref 135–145)

## 2021-12-27 LAB — GLUCOSE, CAPILLARY
Glucose-Capillary: 119 mg/dL — ABNORMAL HIGH (ref 70–99)
Glucose-Capillary: 121 mg/dL — ABNORMAL HIGH (ref 70–99)
Glucose-Capillary: 122 mg/dL — ABNORMAL HIGH (ref 70–99)
Glucose-Capillary: 124 mg/dL — ABNORMAL HIGH (ref 70–99)
Glucose-Capillary: 132 mg/dL — ABNORMAL HIGH (ref 70–99)
Glucose-Capillary: 139 mg/dL — ABNORMAL HIGH (ref 70–99)
Glucose-Capillary: 152 mg/dL — ABNORMAL HIGH (ref 70–99)

## 2021-12-27 LAB — C-REACTIVE PROTEIN: CRP: 10.1 mg/dL — ABNORMAL HIGH (ref <1.0)

## 2021-12-27 LAB — SEDIMENTATION RATE: Sed Rate: 31 mm/hr — ABNORMAL HIGH (ref 0–16)

## 2021-12-27 SURGERY — AMPUTATION DIGIT
Anesthesia: Monitor Anesthesia Care | Laterality: Left

## 2021-12-27 MED ORDER — CHLORHEXIDINE GLUCONATE CLOTH 2 % EX PADS
6.0000 | MEDICATED_PAD | Freq: Once | CUTANEOUS | Status: AC
  Administered 2021-12-27: 6 via TOPICAL

## 2021-12-27 MED ORDER — PROMETHAZINE HCL 25 MG/ML IJ SOLN: 6.2500 mg | INTRAMUSCULAR | Status: DC | PRN

## 2021-12-27 MED ORDER — AMISULPRIDE (ANTIEMETIC) 5 MG/2ML IV SOLN: 10.0000 mg | Freq: Once | INTRAVENOUS | Status: DC | PRN

## 2021-12-27 MED ORDER — PROPOFOL 10 MG/ML IV BOLUS
INTRAVENOUS | Status: DC | PRN
  Administered 2021-12-27: 20 mg via INTRAVENOUS

## 2021-12-27 MED ORDER — ONDANSETRON HCL 4 MG/2ML IJ SOLN
INTRAMUSCULAR | Status: AC
Start: 2021-12-27 — End: ?
  Filled 2021-12-27: qty 2

## 2021-12-27 MED ORDER — OXYCODONE HCL 5 MG/5ML PO SOLN: 5.0000 mg | Freq: Once | ORAL | Status: DC | PRN

## 2021-12-27 MED ORDER — PHENYLEPHRINE 80 MCG/ML (10ML) SYRINGE FOR IV PUSH (FOR BLOOD PRESSURE SUPPORT)
PREFILLED_SYRINGE | INTRAVENOUS | Status: DC | PRN
  Administered 2021-12-27 (×2): 80 ug via INTRAVENOUS

## 2021-12-27 MED ORDER — LIDOCAINE HCL 2 % IJ SOLN
INTRAMUSCULAR | Status: DC | PRN
  Administered 2021-12-27: 10 mL

## 2021-12-27 MED ORDER — FENTANYL CITRATE (PF) 250 MCG/5ML IJ SOLN
INTRAMUSCULAR | Status: AC
  Filled 2021-12-27: qty 5

## 2021-12-27 MED ORDER — CHLORHEXIDINE GLUCONATE 0.12 % MT SOLN
OROMUCOSAL | Status: AC
  Administered 2021-12-27: 15 mL via OROMUCOSAL
  Filled 2021-12-27: qty 15

## 2021-12-27 MED ORDER — ACETAMINOPHEN 10 MG/ML IV SOLN: 1000.0000 mg | Freq: Once | INTRAVENOUS | Status: DC | PRN

## 2021-12-27 MED ORDER — LACTATED RINGERS IV SOLN
INTRAVENOUS | Status: DC
Start: 2021-12-27 — End: 2021-12-27

## 2021-12-27 MED ORDER — CHLORHEXIDINE GLUCONATE CLOTH 2 % EX PADS: 6.0000 | MEDICATED_PAD | Freq: Once | CUTANEOUS | Status: DC

## 2021-12-27 MED ORDER — FENTANYL CITRATE (PF) 250 MCG/5ML IJ SOLN
INTRAMUSCULAR | Status: DC | PRN
  Administered 2021-12-27: 50 ug via INTRAVENOUS

## 2021-12-27 MED ORDER — 0.9 % SODIUM CHLORIDE (POUR BTL) OPTIME
TOPICAL | Status: DC | PRN
  Administered 2021-12-27: 1000 mL

## 2021-12-27 MED ORDER — CHLORHEXIDINE GLUCONATE 0.12 % MT SOLN: 15.0000 mL | Freq: Once | OROMUCOSAL | Status: AC

## 2021-12-27 MED ORDER — LIDOCAINE HCL 2 % IJ SOLN
INTRAMUSCULAR | Status: AC
  Filled 2021-12-27: qty 20

## 2021-12-27 MED ORDER — MIDAZOLAM HCL 2 MG/2ML IJ SOLN
INTRAMUSCULAR | Status: AC
  Filled 2021-12-27: qty 2

## 2021-12-27 MED ORDER — OXYCODONE HCL 5 MG PO TABS: 5.0000 mg | ORAL_TABLET | Freq: Once | ORAL | Status: DC | PRN

## 2021-12-27 MED ORDER — PROPOFOL 500 MG/50ML IV EMUL
INTRAVENOUS | Status: DC | PRN
  Administered 2021-12-27: 125 ug/kg/min via INTRAVENOUS

## 2021-12-27 MED ORDER — FENTANYL CITRATE (PF) 100 MCG/2ML IJ SOLN: 25.0000 ug | INTRAMUSCULAR | Status: DC | PRN

## 2021-12-27 MED ORDER — BUPIVACAINE HCL (PF) 0.5 % IJ SOLN
INTRAMUSCULAR | Status: AC
  Filled 2021-12-27: qty 30

## 2021-12-27 MED ORDER — BUPIVACAINE HCL (PF) 0.5 % IJ SOLN
INTRAMUSCULAR | Status: DC | PRN
  Administered 2021-12-27: 10 mL

## 2021-12-27 MED ORDER — ONDANSETRON HCL 4 MG/2ML IJ SOLN
INTRAMUSCULAR | Status: DC | PRN
  Administered 2021-12-27: 4 mg via INTRAVENOUS

## 2021-12-27 MED ORDER — MIDAZOLAM HCL 2 MG/2ML IJ SOLN
INTRAMUSCULAR | Status: DC | PRN
  Administered 2021-12-27: 2 mg via INTRAVENOUS

## 2021-12-27 MED ORDER — ORAL CARE MOUTH RINSE
15.0000 mL | Freq: Once | OROMUCOSAL | Status: AC
Start: 2021-12-27 — End: 2021-12-27

## 2021-12-27 SURGICAL SUPPLY — 33 items
BAG COUNTER SPONGE SURGICOUNT (BAG) ×2 IMPLANT
BLADE LONG MED 31X9 (MISCELLANEOUS) ×1 IMPLANT
BNDG CONFORM 2 STRL LF (GAUZE/BANDAGES/DRESSINGS) ×2 IMPLANT
BNDG ELASTIC 3X5.8 VLCR STR LF (GAUZE/BANDAGES/DRESSINGS) ×2 IMPLANT
BNDG ELASTIC 4X5.8 VLCR STR LF (GAUZE/BANDAGES/DRESSINGS) ×1 IMPLANT
BNDG ESMARK 4X9 LF (GAUZE/BANDAGES/DRESSINGS) ×2 IMPLANT
BNDG GAUZE DERMACEA FLUFF (GAUZE/BANDAGES/DRESSINGS) ×1
BNDG GAUZE DERMACEA FLUFF 4 (GAUZE/BANDAGES/DRESSINGS) IMPLANT
BNDG GAUZE ELAST 4 BULKY (GAUZE/BANDAGES/DRESSINGS) ×2 IMPLANT
CUFF TOURN SGL QUICK 18X4 (TOURNIQUET CUFF) IMPLANT
DRSG ADAPTIC 3X8 NADH LF (GAUZE/BANDAGES/DRESSINGS) ×1 IMPLANT
DRSG EMULSION OIL 3X3 NADH (GAUZE/BANDAGES/DRESSINGS) ×2 IMPLANT
DURAPREP 26ML APPLICATOR (WOUND CARE) ×2 IMPLANT
ELECT REM PT RETURN 9FT ADLT (ELECTROSURGICAL) ×2
ELECTRODE REM PT RTRN 9FT ADLT (ELECTROSURGICAL) ×1 IMPLANT
GAUZE SPONGE 4X4 12PLY STRL (GAUZE/BANDAGES/DRESSINGS) ×2 IMPLANT
GLOVE BIO SURGEON STRL SZ8 (GLOVE) ×4 IMPLANT
GOWN STRL REUS W/ TWL LRG LVL3 (GOWN DISPOSABLE) ×1 IMPLANT
GOWN STRL REUS W/ TWL XL LVL3 (GOWN DISPOSABLE) ×1 IMPLANT
GOWN STRL REUS W/TWL LRG LVL3 (GOWN DISPOSABLE) ×1
GOWN STRL REUS W/TWL XL LVL3 (GOWN DISPOSABLE) ×1
KIT BASIN OR (CUSTOM PROCEDURE TRAY) ×2 IMPLANT
NDL HYPO 25X1 1.5 SAFETY (NEEDLE) ×1 IMPLANT
NEEDLE HYPO 25X1 1.5 SAFETY (NEEDLE) ×2 IMPLANT
NS IRRIG 1000ML POUR BTL (IV SOLUTION) IMPLANT
PACK ORTHO EXTREMITY (CUSTOM PROCEDURE TRAY) ×2 IMPLANT
SUCTION FRAZIER HANDLE 10FR (MISCELLANEOUS) ×1
SUCTION TUBE FRAZIER 10FR DISP (MISCELLANEOUS) ×1 IMPLANT
SUT PROLENE 3 0 PS 2 (SUTURE) ×2 IMPLANT
SYR 10ML LL (SYRINGE) IMPLANT
TUBE CONNECTING 12X1/4 (SUCTIONS) ×2 IMPLANT
UNDERPAD 30X36 HEAVY ABSORB (UNDERPADS AND DIAPERS) ×2 IMPLANT
YANKAUER SUCT BULB TIP NO VENT (SUCTIONS) IMPLANT

## 2021-12-27 NOTE — Op Note (Signed)
PATIENT:  James [name]  66 y.o. male  PRE-OPERATIVE DIAGNOSIS:  LEFT GREAT TOE SWELLING  POST-OPERATIVE DIAGNOSIS:  LEFT GREAT TOE SWELLING  PROCEDURE:  Procedure(s): AMPUTATION GREAT TOE (Left)  SURGEON:  Surgeon(s) and Role:    * Trula Slade, DPM - Primary  PHYSICIAN ASSISTANT:   ASSISTANTS: none   ANESTHESIA:   MAC  EBL:  5 mL   BLOOD ADMINISTERED:none  DRAINS: none   LOCAL MEDICATIONS USED:  MARCAINE   , XYLOCAINE , and Amount: 20 ml  SPECIMEN:  Hallux  DISPOSITION OF SPECIMEN:  PATHOLOGY  COUNTS:  YES  TOURNIQUET:  * No tourniquets in log *  DICTATION: .Dragon Dictation  PLAN OF CARE: Admit to inpatient   PATIENT DISPOSITION:  PACU - hemodynamically stable.   Delay start of Pharmacological VTE agent (>24hrs) due to surgical blood loss or risk of bleeding: no  Indications for surgery: 66 year old male admitted to the hospital for worsening wound on his left big toe.  He previously was an ingrown toenail himself and the toe became red and swollen.  He has not purulent drainage coming from the wound and he also states he had drainage last night.  MRI and x-rays were performed.  Given fracture of the distal phalanx which is underneath the area of the infection clinically and he also is bacteremic I do think this is osteomyelitis.  I reviewed the x-ray, MRI findings with the patient.  He is in agreement to proceed with interpretation.  Discussed the partial versus total amputation.  We discussed surgical's postoperative course.  Alternatives risk and complications were discussed.  No promises or guarantees were given second of the procedure and all questions were answered to the best my ability.  Procedure in detail: The patient was both verbally and visually identified by myself, nursing staff, the anesthesia staff preoperatively.  He was then transferred to the operating room via stretcher the surgery took place.  After adequate plane of anesthesia was  obtained 10 cc of lidocaine, Marcaine plain was infiltrated in a regional block fashion after timeout was performed.  The left lower extremities and scrubbed, prepped, draped in normal sterile fashion.  At this time incision was planned in a fishmouth incision along the hallux IPJ.  Incision was made with 15 scalpel from skin to bone excising the area of wound, infection of the dorsal nailbed.  The IPJ was identified and the toe was disarticulated at this level.  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  The proximal phalanx had had some mild discoloration so I elected to proceed with excision of the head of the proximal phalanx.  This was completed with a sagittal saw.  Proximal remaining bone appeared to be viable.  It was hard in nature, white in color.  No proximal tracking or purulence.  Incision was copiously irrigated with saline hemostasis achieved.  The incision was closed with 3-0 Prolene in a simple interrupted fashion without tension.  Adaptic was applied followed by dry sterile dressing.  He was woken from anesthesia and found to tolerate the procedure well any complications.  Transferred to PACU vital signs stable and vascular status intact  Postoperative course: Remain inpatient IV antibiotics.  We will plan on changing the bandage tomorrow.  Weight-bear as tolerated in surgical shoe.

## 2021-12-27 NOTE — Progress Notes (Signed)
Orthopedic Tech Progress Note Patient Details:  James [name] 06/25/1956 [id number]  Ortho Devices Type of Ortho Device: Postop shoe/boot Ortho Device/Splint Location: (L) LE Ortho Device/Splint Interventions: Ordered, Application   Post Interventions Patient Tolerated: Fair Instructions Provided: Care of device  [name] 12/27/2021, 3:10 PM

## 2021-12-27 NOTE — Anesthesia Postprocedure Evaluation (Signed)
Anesthesia Post Note  Patient: James [name]  Procedure(s) Performed: AMPUTATION GREAT TOE (Left)     Patient location during evaluation: PACU Anesthesia Type: MAC Level of consciousness: awake Pain management: pain level controlled Vital Signs Assessment: post-procedure vital signs reviewed and stable Respiratory status: spontaneous breathing, nonlabored ventilation, respiratory function stable and patient connected to nasal cannula oxygen Cardiovascular status: stable and blood pressure returned to baseline Postop Assessment: no apparent nausea or vomiting Anesthetic complications: no   No notable events documented.  Last Vitals:  Vitals:   12/27/21 1405 12/27/21 1430  BP: 117/74 113/77  Pulse: 73 66  Resp: 19 20  Temp: 37 C 36.6 C  SpO2: 90% 92%    Last Pain:  Vitals:   12/27/21 1430  TempSrc: Axillary  PainSc: Asleep                 James [name]

## 2021-12-27 NOTE — Anesthesia Procedure Notes (Signed)
Procedure Name: MAC Date/Time: 12/27/2021 1:02 PM  Performed by: Inda Coke, CRNAPre-anesthesia Checklist: Patient identified, Emergency Drugs available, Suction available, Timeout performed and Patient being monitored Patient Re-evaluated:Patient Re-evaluated prior to induction Oxygen Delivery Method: Simple face mask Induction Type: IV induction Dental Injury: Teeth and Oropharynx as per pre-operative assessment

## 2021-12-27 NOTE — Plan of Care (Signed)

## 2021-12-27 NOTE — Consult Note (Addendum)
[facility]    Date of Admission:  12/25/2021     Reason for Consult: Staph bacteremia     Referring Physician: [facility]  Current antibiotics: Cefazolin      ASSESSMENT:    66 y.o. male admitted with:  MSSA bacteremia: Patient presenting with sepsis due to osteomyelitis of left great toe from recent ingrown toenail.  His blood cultures from 12/25/21 are positive in 2 of 2 sets.  MRI formal read is pending to further evaluate extensiveness of bony involvement and podiatry is planning for amputation later today.   Acute kidney injury: Creatinine improved. Type 2 DM: A1c in May was 5.9. Sepsis: Due to #1. Rheumatoid arthritis: On Humira. History of lumbar PLIF with instrumentation in Sept 2020. History of post-traumatic hydrocephalus s/p VP shunt in ~2007. History of left hip replacement about 15 years ago. Cirrhosis: Well  compensated.  RECOMMENDATIONS:    Continue cefazolin 2gm q8h Repeat blood cultures (ordered) Check TTE (ordered) If TTE is negative and patient has amputation, I think TEE would help to definitively exclude endocarditis as this will impact antibiotic duration MRI shows fracture through tuft of distal 1st phalanx and associated marrow enhancement.  Difficult to determine if this is from the fracture vs osteomyelitis but given bacteremia would assume likely superimposed osteomyelitis With his ankle tenderness, I think this area should receive MRI attention as well to ensure no proximal involvement (not ordered) Check ABI given risk factors (ordered).   Lab monitoring Wound care Glycemic control Will follow    Principal Problem:   Osteomyelitis of great toe of left foot (HCC) Active Problems:   Diabetes with neuropathy   Coronary artery disease   Cirrhosis (HCC)   Hypertension   Acute renal failure superimposed on stage 3a chronic kidney disease (HCC)   MEDICATIONS:    Scheduled Meds:  (feeding supplement)  PROSource Plus  30 mL Oral BID   amLODipine  5 mg Oral Daily   atorvastatin  80 mg Oral QHS   carvedilol  12.5 mg Oral BID WC   Chlorhexidine Gluconate Cloth  6 each Topical Q0600   Chlorhexidine Gluconate Cloth  6 each Topical Once   clonazePAM  0.5 mg Oral QHS   DULoxetine  60 mg Oral QHS   gabapentin  300 mg Oral BID   heparin injection (subcutaneous)  5,000 Units Subcutaneous Q8H   insulin aspart  0-6 Units Subcutaneous Q4H   isosorbide mononitrate  60 mg Oral Daily   multivitamin with minerals  1 tablet Oral Daily   mupirocin ointment  1 Application Nasal BID   pantoprazole  40 mg Oral Daily   Continuous Infusions:  sodium chloride 20 mL/hr at 12/25/21 1907    ceFAZolin (ANCEF) IV 2 g (12/27/21 0511)   PRN Meds:.sodium chloride, acetaminophen **OR** acetaminophen, HYDROmorphone (DILAUDID) injection  HPI:    James [name] is a 66 y.o. male with PMHx of CAD, cirrhosis, CKD 3b, HTN, OSA presenting to the emergency department with pain, redness, and swelling involving the left first toe.  Patient reports that he had an ingrown toenail that he removed himself about 2 weeks ago.  The morning of admission, he noted his toe was more swollen and sore.  He presented to the ED.  He was afebrile at that time but X-rays raised concern for OM of the toe and first metatarsal.  Patient was started on antibiotics following blood cultures.  Podiatry was consulted and he was admitted to  the hospital.    He had an MRI completed early today and the radiology read is pending.  Podiatry notes strong pulses and brisk capillary refill on their exam.  He is planned for amputation later today with podiatry.  We have been consulted because his blood cultures are positive for MSSA.    Past Medical History:  Diagnosis Date   Abnormal cardiac CT angiography    Acid reflux    Annual physical exam 04/08/2015   Arthritis    Atypical chest pain 06/13/2020   Blood transfusion without reported diagnosis     Body mass index (BMI) 35.0-35.9, adult 04/05/2019   Chronic fatigue 01/28/2015   Chronic headaches    on cymbalta   Chronic migraine w/o aura, not intractable, w/o stat migr 10/24/2018   Cirrhosis ([place])    Colon polyps    Complication of anesthesia    problems waking up from anesthesia   Coronary artery disease 01/23/2019   Depression    on cymbalta   Diabetes mellitus with neuropathy ([place])    Diabetes with neuropathy 04/25/2013   Diverticulitis 03/2013   Dyslipidemia 05/29/2019   Eczema    Elevated LFTs    Epidural lipomatosis 10/05/2018   Essential (primary) hypertension 04/05/2019   Essential hypertension 10/10/2019   Fatty liver    GERD (gastroesophageal reflux disease) 04/28/2011   H/O craniotomy 05/07/2015   Headache 04/28/2011   Hepatitis 10/2017   NASH cirrhosis   History of kidney stones    Hyperlipidemia    Hypersomnia with sleep apnea 01/28/2015   Hypertension    IDA (iron deficiency anemia) 01/24/2019   Idiopathic intracranial hypertension 01/14/2017   Insomnia 04/26/2013   Kidney stone    Liver cirrhosis secondary to NASH (nonalcoholic steatohepatitis) ([place]) 01/02/2016   Low back pain 04/05/2019   Lower back injury 08/14/2019   Morbid obesity ([place])    Neuromuscular disorder ([place])    neuropathy   Neuropathy    Nonalcoholic steatohepatitis 12/30/812   Obstructive hydrocephalus ([place]) 01/28/2015   OSA -- dx ~ 2012, cpap intolerant 09/04/2014    dx ~ 2012, cpap intolerant    PCP NOTES >>> 04/08/2015   Post-op pain 03/19/2019   Post-traumatic hydrocephalus (HCC)    s/p shunts x 2 (first got infected )   Presence of cerebrospinal fluid drainage device 07/28/2011   Psoriasis    sees Dr [name]   Psoriatic arthritis ([place])    REM behavioral disorder 01/14/2017   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   see's Dr.Ortmann   Severe obesity (BMI >= 40) (HCC) 01/28/2015   SI (sacroiliac) joint dysfunction 08/14/2019   Sigmoid diverticulitis 04/25/2013   Sleep apnea    no  CPAP      Spondylolisthesis, lumbar region 03/16/2019   Stomach ulcer    Testosterone deficiency 04/28/2011   VP (ventriculoperitoneal) shunt status 07/31/2020    Social History   Tobacco Use   Smoking status: Never   Smokeless tobacco: Never  Vaping Use   Vaping Use: Never used  Substance Use Topics   Alcohol use: No   Drug use: No    Family History  Problem Relation Age of Onset   Other Mother    Lung cancer Father        alive, former smoker    Heart disease Brother        MI age 17   Other Brother        Murdered   Down syndrome Son    Diabetes  Neg Hx    Prostate cancer Neg Hx    Colon cancer Neg Hx    Stomach cancer Neg Hx    Pancreatic cancer Neg Hx    Liver disease Neg Hx     Allergies  Allergen Reactions   Bee Venom Anaphylaxis   Hydrocodone Bit-Homatrop Mbr Other (See Comments)    Hallucinations, confusion, delirium Depressed feeling   Toradol [Ketorolac Tromethamine] Other (See Comments)    Hallucinations, confusion, delirium   Sulfadiazine     NDC URKY:[id number] NDC [id number] NDC RSWN:[id number]   Morphine And Related Other (See Comments)    Hallucinations, back in the 80s. States has taken vicodin before w/o problems    Sulfa Drugs Cross Reactors Rash    Review of Systems  Constitutional:  Positive for fever.  Respiratory: Negative.    Cardiovascular: Negative.   Musculoskeletal:  Positive for joint pain (Left toe and ankle pain.  No hip pain or other joints.). Negative for back pain and neck pain.  All other systems reviewed and are negative.   OBJECTIVE:   Blood pressure 122/84, pulse 79, temperature 99 F (37.2 C), temperature source Oral, resp. rate 18, height 5' 11"  (1.803 m), weight 113.4 kg, SpO2 90 %. Body mass index is 34.87 kg/m.  Physical Exam Constitutional:      General: He is not in acute distress.    Appearance: Normal appearance.  HENT:     Head: Normocephalic and atraumatic.  Eyes:     Extraocular  Movements: Extraocular movements intact.     Conjunctiva/sclera: Conjunctivae normal.  Pulmonary:     Effort: Pulmonary effort is normal. No respiratory distress.  Abdominal:     General: There is no distension.     Palpations: Abdomen is soft.     Tenderness: There is no abdominal tenderness.  Musculoskeletal:     Cervical back: Normal range of motion and neck supple.     Comments: Left great toe is wrapped as he states it was oozing over night.  He is tender over his left medial ankle area without any ulcer or drainage.   Skin:    General: Skin is warm and dry.  Neurological:     General: No focal deficit present.     Mental Status: He is alert and oriented to person, place, and time.  Psychiatric:        Mood and Affect: Mood normal.        Behavior: Behavior normal.      Lab Results: Lab Results  Component Value Date   WBC 8.3 12/27/2021   HGB 12.3 (L) 12/27/2021   HCT 36.2 (L) 12/27/2021   MCV 87.9 12/27/2021   PLT 125 (L) 12/27/2021    Lab Results  Component Value Date   NA 137 12/27/2021   K 4.2 12/27/2021   CO2 22 12/27/2021   GLUCOSE 125 (H) 12/27/2021   BUN 17 12/27/2021   CREATININE 1.56 (H) 12/27/2021   CALCIUM 9.5 12/27/2021   GFRNONAA 50 (L) 12/27/2021   GFRAA 66 08/08/2020    Lab Results  Component Value Date   ALT 18 10/24/2021   AST 15 10/24/2021   ALKPHOS 78 10/24/2021   BILITOT 1.8 (H) 10/24/2021       Component Value Date/Time   CRP 10.1 (H) 12/27/2021 0109       Component Value Date/Time   ESRSEDRATE 31 (H) 12/27/2021 0109    I have reviewed the micro and lab results in Epic.  Imaging: MR FOOT LEFT  WO CONTRAST  Result Date: 12/27/2021 CLINICAL DATA:  Diabetic with foot swelling. Osteomyelitis suspected. Pain, erythema and swelling of the great toe. EXAM: MRI OF THE LEFT FOOT WITHOUT CONTRAST TECHNIQUE: Multiplanar, multisequence MR imaging of the left forefoot was performed. No intravenous contrast was administered. COMPARISON:   Radiographs 12/25/2021 and 08/04/2016. FINDINGS: Bones/Joint/Cartilage In correlation with the recent radiographs, there is a transverse fracture through the distal aspect of the distal 1st phalanx. There is associated nonspecific T2 hyperintensity throughout the distal phalanx. No definite cortical destruction. There are mild degenerative changes at the interphalangeal and metatarsal-phalangeal joints of the great toe. No significant abnormalities of the additional toes or metatarsals. No significant joint effusions. Ligaments Intact Lisfranc ligament and collateral ligaments of the metatarsophalangeal joints. Muscles and Tendons Nonspecific T2 hyperintensity throughout the forefoot musculature, likely due to diabetic myopathy. The tendons appear intact without significant tenosynovitis. Soft tissues No focal forefoot fluid collection. Mild soft tissue swelling in the great toe with questionable skin ulceration at the base of the nail bed, best seen on the sagittal images. IMPRESSION: 1. Transverse fracture through the tuft of the distal 1st phalanx. 2. Associated marrow T2 hyperintensity throughout the distal 1st phalanx is nonspecific, and would be expected in the setting of a fracture. Superimposed osteomyelitis difficult to exclude in this setting. That possibility would be greater if there is a superimposed skin ulcer dorsally as suggested on the sagittal images. Correlate clinically. 3. No focal fluid collections. Electronically Signed   By: Richardean Sale M.D.   On: 12/27/2021 08:26   DG Foot Complete Left  Result Date: 12/25/2021 CLINICAL DATA:  Diabetic foot infection EXAM: LEFT FOOT - COMPLETE 3+ VIEW COMPARISON:  Foot x-rays August 04, 2016. FINDINGS: Erosive changes of the tuft of the first toe. Surrounding soft tissue swelling/ulcer. Milder erosive change along the lateral aspect of the distal first metatarsal. Remote trauma versus postsurgical change of the distal fifth digit. No evidence of  acute fracture or joint malalignment. Vascular calcifications. Mild scattered degenerative change. IMPRESSION: 1. Erosive changes of the tuft of the first toe, suspicious for acute osteomyelitis given the clinical history and surrounding soft tissue swelling/ulcer. 2. Milder erosive change along the lateral aspect of the distal first metatarsal, which could represent age indeterminate sequela of osteomyelitis. 3. MRI could further evaluate if clinically warranted. Electronically Signed   By: Margaretha Sheffield M.D.   On: 12/25/2021 17:22     Imaging independently reviewed in Epic.  [facility] [phone] pager 12/27/2021, 8:45 AM

## 2021-12-27 NOTE — Progress Notes (Signed)
PROGRESS NOTE    James [name]  NID:[id number] DOB: May 21, 1956 DOA: 12/25/2021 PCP: Colon Branch, MD    Brief Narrative:   James [name] is a pleasant 66 y.o. male with medical history significant for coronary artery disease, cirrhosis, hypertension, OSA with CPAP intolerance, well-controlled type 2 diabetes mellitus, and CKD 3B, now presenting to the emergency department with pain, redness, and swelling involving the left first toe.  Patient reports that he had an ingrown toenail that he removed himself 2 weeks ago, and then noted this morning that the toe was red, sore, and swollen. Found to have bacteremia  Assessment and Plan:  Left 1st toe osteomyelitis  - Presents with left great toe pain, swelling, and redness after removing his ingrown nail 2 weeks ago  - Plain radiographs concerning for osteomyelitis  - He is not septic on admission - Podiatry was consulted MRI done - Continue antibiotics, follow-up podiatry recommendations     MSSA bacteremia -IV abx -echo -id consult   AKI on CKD IIIa - SCr is 1.80 on admission, up from 1.4 - improved with IVF   CAD  - Stent placed in April 2022  - No anginal complaints  - Continue Lipitor, hold Plavix pending podiatry consult     Hypertension  - Continue Norvasc and Coreg     type II DM  - A1c was 5.9% in May 2023     Cirrhosis  - Appears compensated    Obesity Estimated body mass index is 34.87 kg/m as calculated from the following:   Height as of this encounter: 5' 11"  (1.803 m).   Weight as of this encounter: 113.4 kg.   DVT prophylaxis: SCD's Start: 12/27/21 0639 heparin injection 5,000 Units Start: 12/26/21 2200 SCDs Start: 12/26/21 0433    Code Status: Full Code   Disposition Plan:  Level of care: Med-Surg Status is: Inpatient Remains inpatient appropriate because: podiatry eval    Consultants:  podiatry   Subjective: No overnight events  Objective: Vitals:   12/26/21 0814 12/26/21 1546  12/27/21 0637 12/27/21 0802  BP: 127/82 102/72 (!) 141/77 122/84  Pulse: 83 79 79 79  Resp: 17  17 18   Temp: 99.9 F (37.7 C) 98.8 F (37.1 C) 99.3 F (37.4 C) 99 F (37.2 C)  TempSrc:   Oral Oral  SpO2: 93% 91% 94% 90%  Weight:      Height:        Intake/Output Summary (Last 24 hours) at 12/27/2021 1112 Last data filed at 12/27/2021 1000 Gross per 24 hour  Intake 100 ml  Output --  Net 100 ml   Filed Weights   12/25/21 1648  Weight: 113.4 kg    Examination:   General: Appearance:    Obese male in no acute distress     Lungs:      respirations unlabored  Heart:    Normal heart rate.    MS:   All extremities are intact.    Neurologic:   Awake, alert, oriented x 3. No apparent focal neurological           defect.        Data Reviewed: I have personally reviewed following labs and imaging studies  CBC: Recent Labs  Lab 12/25/21 1734 12/26/21 0450 12/27/21 0109  WBC 10.0 8.7 8.3  NEUTROABS 7.4  --   --   HGB 14.2 13.5 12.3*  HCT 41.3 40.7 36.2*  MCV 86.9 87.7 87.9  PLT 159 118* 125*  Basic Metabolic Panel: Recent Labs  Lab 12/25/21 1734 12/26/21 0450 12/27/21 0109  NA 135 137 137  K 4.4 3.9 4.2  CL 105 106 108  CO2 23 23 22   GLUCOSE 176* 127* 125*  BUN 25* 18 17  CREATININE 1.80* 1.60* 1.56*  CALCIUM 9.7 9.6 9.5   GFR: Estimated Creatinine Clearance: 62 mL/min (A) (by C-G formula based on SCr of 1.56 mg/dL (H)). Liver Function Tests: No results for input(s): "AST", "ALT", "ALKPHOS", "BILITOT", "PROT", "ALBUMIN" in the last 168 hours. No results for input(s): "LIPASE", "AMYLASE" in the last 168 hours. No results for input(s): "AMMONIA" in the last 168 hours. Coagulation Profile: No results for input(s): "INR", "PROTIME" in the last 168 hours. Cardiac Enzymes: No results for input(s): "CKTOTAL", "CKMB", "CKMBINDEX", "TROPONINI" in the last 168 hours. BNP (last 3 results) No results for input(s): "PROBNP" in the last 8760 hours. HbA1C: No  results for input(s): "HGBA1C" in the last 72 hours. CBG: Recent Labs  Lab 12/26/21 1547 12/26/21 2213 12/26/21 2314 12/27/21 0405 12/27/21 0759  GLUCAP 174* 143* 128* 124* 132*   Lipid Profile: No results for input(s): "CHOL", "HDL", "LDLCALC", "TRIG", "CHOLHDL", "LDLDIRECT" in the last 72 hours. Thyroid Function Tests: No results for input(s): "TSH", "T4TOTAL", "FREET4", "T3FREE", "THYROIDAB" in the last 72 hours. Anemia Panel: No results for input(s): "VITAMINB12", "FOLATE", "FERRITIN", "TIBC", "IRON", "RETICCTPCT" in the last 72 hours. Sepsis Labs: Recent Labs  Lab 12/25/21 1743  LATICACIDVEN 2.0*    Recent Results (from the past 240 hour(s))  Blood Cultures x 2 sites     Status: Abnormal (Preliminary result)   Collection Time: 12/25/21  5:48 PM   Specimen: BLOOD  Result Value Ref Range Status   Specimen Description   Final    BLOOD RIGHT ANTECUBITAL Performed at [facility], [address] [place], [place] [address]    Special Requests   Final    BOTTLES DRAWN AEROBIC AND ANAEROBIC Blood Culture adequate volume Performed at [facility], [address] [place], [place] [address]    Culture  Setup Time   Final    GRAM POSITIVE COCCI IN CLUSTERS AEROBIC BOTTLE ONLY CRITICAL RESULT CALLED TO, READ BACK BY AND VERIFIED WITH: PHARMD A. MEYER [id number] @1750  FH IN BOTH AEROBIC AND ANAEROBIC BOTTLES    Culture (A)  Final    STAPHYLOCOCCUS AUREUS SUSCEPTIBILITIES TO FOLLOW Performed at [facility], [place] [address] [place], [place] [address]    Report Status PENDING  Incomplete  Blood Culture ID Panel (Reflexed)     Status: Abnormal   Collection Time: 12/25/21  5:48 PM  Result Value Ref Range Status   Enterococcus faecalis NOT DETECTED NOT DETECTED Final   Enterococcus Faecium NOT DETECTED NOT DETECTED Final   Listeria monocytogenes NOT DETECTED NOT DETECTED Final   Staphylococcus species DETECTED (A) NOT DETECTED Final    Comment:  CRITICAL RESULT CALLED TO, READ BACK BY AND VERIFIED WITH: PHARMD A. MEYER [id number] @1750  FH    Staphylococcus aureus (BCID) DETECTED (A) NOT DETECTED Final    Comment: CRITICAL RESULT CALLED TO, READ BACK BY AND VERIFIED WITH: PHARMD A. GEZMO [id number] @1750  FH    Staphylococcus epidermidis NOT DETECTED NOT DETECTED Final   Staphylococcus lugdunensis NOT DETECTED NOT DETECTED Final   Streptococcus species NOT DETECTED NOT DETECTED Final   Streptococcus agalactiae NOT DETECTED NOT DETECTED Final   Streptococcus pneumoniae NOT DETECTED NOT DETECTED Final   Streptococcus pyogenes NOT DETECTED NOT  DETECTED Final   A.calcoaceticus-baumannii NOT DETECTED NOT DETECTED Final   Bacteroides fragilis NOT DETECTED NOT DETECTED Final   Enterobacterales NOT DETECTED NOT DETECTED Final   Enterobacter cloacae complex NOT DETECTED NOT DETECTED Final   Escherichia coli NOT DETECTED NOT DETECTED Final   Klebsiella aerogenes NOT DETECTED NOT DETECTED Final   Klebsiella oxytoca NOT DETECTED NOT DETECTED Final   Klebsiella pneumoniae NOT DETECTED NOT DETECTED Final   Proteus species NOT DETECTED NOT DETECTED Final   Salmonella species NOT DETECTED NOT DETECTED Final   Serratia marcescens NOT DETECTED NOT DETECTED Final   Haemophilus influenzae NOT DETECTED NOT DETECTED Final   Neisseria meningitidis NOT DETECTED NOT DETECTED Final   Pseudomonas aeruginosa NOT DETECTED NOT DETECTED Final   Stenotrophomonas maltophilia NOT DETECTED NOT DETECTED Final   Candida albicans NOT DETECTED NOT DETECTED Final   Candida auris NOT DETECTED NOT DETECTED Final   Candida glabrata NOT DETECTED NOT DETECTED Final   Candida krusei NOT DETECTED NOT DETECTED Final   Candida parapsilosis NOT DETECTED NOT DETECTED Final   Candida tropicalis NOT DETECTED NOT DETECTED Final   Cryptococcus neoformans/gattii NOT DETECTED NOT DETECTED Final   Meth resistant mecA/C and MREJ NOT DETECTED NOT DETECTED Final    Comment: Performed at  [facility], [address] [place], [place] [address]  Blood Cultures x 2 sites     Status: Abnormal (Preliminary result)   Collection Time: 12/25/21  6:21 PM   Specimen: BLOOD RIGHT HAND  Result Value Ref Range Status   Specimen Description   Final    BLOOD RIGHT HAND Performed at [facility], [address] [place], [place] [address]    Special Requests   Final    BOTTLES DRAWN AEROBIC AND ANAEROBIC Blood Culture adequate volume Performed at [facility], [address] [place], [place] [address]    Culture  Setup Time   Final    GRAM POSITIVE COCCI IN CLUSTERS AEROBIC BOTTLE ONLY CRITICAL VALUE NOTED.  VALUE IS CONSISTENT WITH PREVIOUSLY REPORTED AND CALLED VALUE. CRITICAL RESULT CALLED TO, READ BACK BY AND VERIFIED WITH: Margreta Journey [id number] @1750  FH Performed at [facility], [address], [place], [place] [address]    Culture STAPHYLOCOCCUS AUREUS (A)  Final   Report Status PENDING  Incomplete  Surgical pcr screen     Status: Abnormal   Collection Time: 12/26/21  5:21 AM   Specimen: Nasal Mucosa; Nasal Swab  Result Value Ref Range Status   MRSA, PCR POSITIVE (A) NEGATIVE Final    Comment: RESULT CALLED TO, READ BACK BY AND VERIFIED WITH: RN KAY BROWN 12/26/21@6 :50 BY TW    Staphylococcus aureus POSITIVE (A) NEGATIVE Final    Comment: (NOTE) The Xpert SA Assay (FDA approved for NASAL specimens in patients 3 years of age and older), is one component of a comprehensive surveillance program. It is not intended to diagnose infection nor to guide or monitor treatment. Performed at [facility], [place] [address] [place], [place] [address]   Culture, blood (Routine X 2) w Reflex to ID Panel     Status: None (Preliminary result)   Collection Time: 12/27/21  7:15 AM   Specimen: BLOOD RIGHT HAND  Result Value Ref Range Status   Specimen Description BLOOD RIGHT HAND  Final   Special Requests   Final    BOTTLES DRAWN AEROBIC AND  ANAEROBIC Blood Culture adequate volume   Culture  Final    NO GROWTH <12 HOURS Performed at [facility], [place] [address], [place], [place] [address]    Report Status PENDING  Incomplete  Culture, blood (Routine X 2) w Reflex to ID Panel     Status: None (Preliminary result)   Collection Time: 12/27/21  7:15 AM   Specimen: BLOOD LEFT HAND  Result Value Ref Range Status   Specimen Description BLOOD LEFT HAND  Final   Special Requests   Final    BOTTLES DRAWN AEROBIC AND ANAEROBIC Blood Culture results may not be optimal due to an excessive volume of blood received in culture bottles   Culture   Final    NO GROWTH <12 HOURS Performed at [facility], [place] [address] [place], [place] [address]    Report Status PENDING  Incomplete         Radiology Studies: MR FOOT LEFT WO CONTRAST  Result Date: 12/27/2021 CLINICAL DATA:  Diabetic with foot swelling. Osteomyelitis suspected. Pain, erythema and swelling of the great toe. EXAM: MRI OF THE LEFT FOOT WITHOUT CONTRAST TECHNIQUE: Multiplanar, multisequence MR imaging of the left forefoot was performed. No intravenous contrast was administered. COMPARISON:  Radiographs 12/25/2021 and 08/04/2016. FINDINGS: Bones/Joint/Cartilage In correlation with the recent radiographs, there is a transverse fracture through the distal aspect of the distal 1st phalanx. There is associated nonspecific T2 hyperintensity throughout the distal phalanx. No definite cortical destruction. There are mild degenerative changes at the interphalangeal and metatarsal-phalangeal joints of the great toe. No significant abnormalities of the additional toes or metatarsals. No significant joint effusions. Ligaments Intact Lisfranc ligament and collateral ligaments of the metatarsophalangeal joints. Muscles and Tendons Nonspecific T2 hyperintensity throughout the forefoot musculature, likely due to diabetic myopathy. The tendons appear intact without significant  tenosynovitis. Soft tissues No focal forefoot fluid collection. Mild soft tissue swelling in the great toe with questionable skin ulceration at the base of the nail bed, best seen on the sagittal images. IMPRESSION: 1. Transverse fracture through the tuft of the distal 1st phalanx. 2. Associated marrow T2 hyperintensity throughout the distal 1st phalanx is nonspecific, and would be expected in the setting of a fracture. Superimposed osteomyelitis difficult to exclude in this setting. That possibility would be greater if there is a superimposed skin ulcer dorsally as suggested on the sagittal images. Correlate clinically. 3. No focal fluid collections. Electronically Signed   By: Richardean Sale M.D.   On: 12/27/2021 08:26   DG Foot Complete Left  Result Date: 12/25/2021 CLINICAL DATA:  Diabetic foot infection EXAM: LEFT FOOT - COMPLETE 3+ VIEW COMPARISON:  Foot x-rays August 04, 2016. FINDINGS: Erosive changes of the tuft of the first toe. Surrounding soft tissue swelling/ulcer. Milder erosive change along the lateral aspect of the distal first metatarsal. Remote trauma versus postsurgical change of the distal fifth digit. No evidence of acute fracture or joint malalignment. Vascular calcifications. Mild scattered degenerative change. IMPRESSION: 1. Erosive changes of the tuft of the first toe, suspicious for acute osteomyelitis given the clinical history and surrounding soft tissue swelling/ulcer. 2. Milder erosive change along the lateral aspect of the distal first metatarsal, which could represent age indeterminate sequela of osteomyelitis. 3. MRI could further evaluate if clinically warranted. Electronically Signed   By: Margaretha Sheffield M.D.   On: 12/25/2021 17:22        Scheduled Meds:  (feeding supplement) PROSource Plus  30 mL Oral BID   amLODipine  5 mg Oral Daily   atorvastatin  80 mg Oral QHS   carvedilol  12.5 mg Oral BID WC   Chlorhexidine Gluconate Cloth  6 each Topical Q0600    Chlorhexidine Gluconate Cloth  6 each Topical Once   clonazePAM  0.5 mg Oral QHS   DULoxetine  60 mg Oral QHS   gabapentin  300 mg Oral BID   heparin injection (subcutaneous)  5,000 Units Subcutaneous Q8H   insulin aspart  0-6 Units Subcutaneous Q4H   isosorbide mononitrate  60 mg Oral Daily   multivitamin with minerals  1 tablet Oral Daily   mupirocin ointment  1 Application Nasal BID   pantoprazole  40 mg Oral Daily   Continuous Infusions:  sodium chloride 20 mL/hr at 12/25/21 1907    ceFAZolin (ANCEF) IV 2 g (12/27/21 0511)     LOS: 2 days    Time spent: 45 minutes spent on chart review, discussion with nursing staff, consultants, updating family and interview/physical exam; more than 50% of that time was spent in counseling and/or coordination of care.    Geradine Girt, DO [facility] Available via Epic secure chat 7am-7pm After these hours, please refer to coverage provider listed on [email or web address] 12/27/2021, 11:12 AM

## 2021-12-27 NOTE — Progress Notes (Signed)
Patient ID: James [name], male   DOB: 1955-08-22, 66 y.o.   MRN: [id number]  James [name] was seen today for reprogramming of his shunt valve due to undergoing an MRI this morning in preporation for surgery today. The shunt valve was reprogrammed post MRI.   Shunt configuration: VP Valve type: Strata Valve to 1.5  Procedure tolerated by patient without complications.  Strata Valve reprogrammed to pre-MRI setting of 1.5 (found to be 2.0 initially with patient reporting no symptoms since MRI)   Marvis Moeller, DNP, AGNP-C Neurosurgery Nurse Practitioner  [facility] [address], [place], [place] [address] P: [phone]    F: [phone]  12/27/2021 7:50 AM

## 2021-12-27 NOTE — Transfer of Care (Signed)
Immediate Anesthesia Transfer of Care Note  Patient: James [name]  Procedure(s) Performed: AMPUTATION GREAT TOE (Left)  Patient Location: PACU  Anesthesia Type:MAC  Level of Consciousness: awake and alert   Airway & Oxygen Therapy: Patient Spontanous Breathing  Post-op Assessment: Report given to RN and Post -op Vital signs reviewed and stable  Post vital signs: Reviewed and stable  Last Vitals:  Vitals Value Taken Time  BP 116/72 12/27/21 1334  Temp    Pulse 74 12/27/21 1338  Resp 18 12/27/21 1338  SpO2 90 % 12/27/21 1338  Vitals shown include unvalidated device data.  Last Pain:  Vitals:   12/27/21 1223  TempSrc: Oral  PainSc:       Patients Stated Pain Goal: 3 (66/59/93 5701)  Complications: No notable events documented.

## 2021-12-27 NOTE — Interval H&P Note (Signed)
History and Physical Interval Note:  12/27/2021 12:14 PM  James [name]  has presented today for surgery, with the diagnosis of LEFT GREAT TOE SWELLING.  The various methods of treatment have been discussed with the patient and family. After consideration of risks, benefits and other options for treatment, the patient has consented to  Procedure(s): AMPUTATION GREAT TOE (Left) as a surgical intervention.  The patient's history has been reviewed, patient examined, no change in status, stable for surgery.  I have reviewed the patient's chart and labs.  Questions were answered to the patient's satisfaction.     Trula Slade

## 2021-12-27 NOTE — Anesthesia Preprocedure Evaluation (Addendum)
Anesthesia Evaluation  Patient identified by MRN, date of birth, ID band Patient awake    Reviewed: Allergy & Precautions, NPO status , Patient's Chart, lab work & pertinent test results  Airway Mallampati: III  TM Distance: >3 FB Neck ROM: Full    Dental  (+) Edentulous Upper, Edentulous Lower   Pulmonary sleep apnea ,    Pulmonary exam normal        Cardiovascular hypertension, Pt. on medications and Pt. on home beta blockers + CAD and + Cardiac Stents (x 1 )  Normal cardiovascular exam     Neuro/Psych  Headaches, PSYCHIATRIC DISORDERS Depression    GI/Hepatic PUD, GERD  Medicated and Controlled,(+) Cirrhosis       , Hepatitis -  Endo/Other  diabetes, Oral Hypoglycemic Agents  Renal/GU Renal disease     Musculoskeletal  (+) Arthritis ,   Abdominal (+) + obese,   Peds  Hematology  (+) Blood dyscrasia, anemia ,   Anesthesia Other Findings LEFT GREAT TOE SWELLING  Reproductive/Obstetrics                            Anesthesia Physical Anesthesia Plan  ASA: 3  Anesthesia Plan: MAC   Post-op Pain Management:    Induction: Intravenous  PONV Risk Score and Plan: 1 and Ondansetron, Dexamethasone, Propofol infusion, Midazolam and Treatment may vary due to age or medical condition  Airway Management Planned: Simple Face Mask  Additional Equipment:   Intra-op Plan:   Post-operative Plan:   Informed Consent: I have reviewed the patients History and Physical, chart, labs and discussed the procedure including the risks, benefits and alternatives for the proposed anesthesia with the patient or authorized representative who has indicated his/her understanding and acceptance.       Plan Discussed with: CRNA and Surgeon  Anesthesia Plan Comments:         Anesthesia Quick Evaluation

## 2021-12-28 ENCOUNTER — Inpatient Hospital Stay (HOSPITAL_COMMUNITY): Payer: 59

## 2021-12-28 ENCOUNTER — Encounter (HOSPITAL_COMMUNITY): Payer: Self-pay | Admitting: Podiatry

## 2021-12-28 DIAGNOSIS — R7881 Bacteremia: Secondary | ICD-10-CM | POA: Diagnosis not present

## 2021-12-28 DIAGNOSIS — B9561 Methicillin susceptible Staphylococcus aureus infection as the cause of diseases classified elsewhere: Secondary | ICD-10-CM | POA: Diagnosis not present

## 2021-12-28 LAB — BASIC METABOLIC PANEL
Anion gap: 9 (ref 5–15)
BUN: 18 mg/dL (ref 8–23)
CO2: 24 mmol/L (ref 22–32)
Calcium: 9.4 mg/dL (ref 8.9–10.3)
Chloride: 107 mmol/L (ref 98–111)
Creatinine, Ser: 1.53 mg/dL — ABNORMAL HIGH (ref 0.61–1.24)
GFR, Estimated: 51 mL/min — ABNORMAL LOW (ref >60)
Glucose, Bld: 120 mg/dL — ABNORMAL HIGH (ref 70–99)
Potassium: 4.2 mmol/L (ref 3.5–5.1)
Sodium: 140 mmol/L (ref 135–145)

## 2021-12-28 LAB — ECHOCARDIOGRAM COMPLETE
AR max vel: 2.15 cm2
AV Peak grad: 5.3 mmHg
Ao pk vel: 1.15 m/s
Area-P 1/2: 3.89 cm2
Calc EF: 57.3 %
Height: 70.984 in
S' Lateral: 2.9 cm
Single Plane A2C EF: 59.7 %
Single Plane A4C EF: 56.4 %
Weight: 3999.99 oz

## 2021-12-28 LAB — CBC
HCT: 37 % — ABNORMAL LOW (ref 39.0–52.0)
Hemoglobin: 12.6 g/dL — ABNORMAL LOW (ref 13.0–17.0)
MCH: 30.4 pg (ref 26.0–34.0)
MCHC: 34.1 g/dL (ref 30.0–36.0)
MCV: 89.2 fL (ref 80.0–100.0)
Platelets: 116 10*3/uL — ABNORMAL LOW (ref 150–400)
RBC: 4.15 MIL/uL — ABNORMAL LOW (ref 4.22–5.81)
RDW: 14.9 % (ref 11.5–15.5)
WBC: 6.2 10*3/uL (ref 4.0–10.5)
nRBC: 0 % (ref 0.0–0.2)

## 2021-12-28 LAB — GLUCOSE, CAPILLARY
Glucose-Capillary: 126 mg/dL — ABNORMAL HIGH (ref 70–99)
Glucose-Capillary: 131 mg/dL — ABNORMAL HIGH (ref 70–99)
Glucose-Capillary: 178 mg/dL — ABNORMAL HIGH (ref 70–99)
Glucose-Capillary: 172 mg/dL — ABNORMAL HIGH (ref 70–99)

## 2021-12-28 LAB — CULTURE, BLOOD (ROUTINE X 2)
Special Requests: ADEQUATE
Special Requests: ADEQUATE

## 2021-12-28 MED ORDER — CLOPIDOGREL BISULFATE 75 MG PO TABS
75.0000 mg | ORAL_TABLET | Freq: Every day | ORAL | Status: DC
  Administered 2021-12-28 – 2022-01-01 (×5): 75 mg via ORAL
  Filled 2021-12-28 (×5): qty 1

## 2021-12-28 MED ORDER — INSULIN ASPART 100 UNIT/ML IJ SOLN: 0.0000 [IU] | Freq: Every day | INTRAMUSCULAR | Status: DC

## 2021-12-28 MED ORDER — INSULIN ASPART 100 UNIT/ML IJ SOLN
0.0000 [IU] | Freq: Three times a day (TID) | INTRAMUSCULAR | Status: DC
  Administered 2021-12-28 – 2021-12-29 (×2): 3 [IU] via SUBCUTANEOUS
  Administered 2021-12-29 – 2021-12-30 (×3): 2 [IU] via SUBCUTANEOUS
  Administered 2021-12-30: 3 [IU] via SUBCUTANEOUS
  Administered 2021-12-31 (×2): 2 [IU] via SUBCUTANEOUS
  Administered 2021-12-31: 3 [IU] via SUBCUTANEOUS
  Administered 2022-01-01: 2 [IU] via SUBCUTANEOUS

## 2021-12-28 NOTE — Progress Notes (Signed)
Subjective: POD # 1 s/p left foot partial hallux amputation. Pain in "alright", reports 3/10.  Occasional fevers and chills but not currently.  Objective: AAO x3, NAD DP/PT pulses palpable bilaterally, CRT less than 3 seconds She is the bandage is bandage cision clean, dry, and intact.  Incision well coapted without any signs of dehiscence at this time.  There is some dried blood on the incision.  There is mild edema to the toe with resolving erythema.  He still gets some pain with ankle joint range of motion but there is no significant erythema or warmth to the ankle joint itself. No pain with calf compression, swelling, warmth, erythema  Assessment: POD # 1 s/p left hallux amputation  Plan: -WBC is 6.2, Tmax 99.2.  -Culture: no growth, prelim -Pathology: pending -ECHO pending for today.  -MRI left ankle ordered to rule out any proximal infection.  -ABI previously ordered, awaiting results  -WBAT in surgical shoe- discussed limited weightbearing.  -Elevation  -Podiatry will continue to follow  Celesta Gentile, DPM

## 2021-12-28 NOTE — Progress Notes (Signed)
Neurosurgeon's office Glenford Peers, NP) was called in to inform that the pt just came back from the MRI and should see the pt to reset the shunt. Awaiting for a call back.

## 2021-12-28 NOTE — Progress Notes (Signed)
Patient ID: James [name], male   DOB: 06/24/1956, 66 y.o.   MRN: [id number]   Patient is POD # 1 s/p left foot partial hallux amputation. An MRI of the patient's left ankle was obtained to rule out any proximal infection. At this time, the patient's MRI has been completed and is need of VP shunt valve reprogramming. The patient reports no headaches or other symptomatology related to his VP Shunt.    Shunt configuration: VP Valve type: Strata Valve to 1.5   Procedure tolerated by patient without complications.   Strata Valve programming was confirmed to be at pre-MRI setting of 1.5 (found to be 1.5 with patient reporting no symptoms since MRI)    Marvis Moeller, DNP, AGNP-C Neurosurgery Nurse Practitioner  [facility] [address], [place], [place] [address] P: [phone]    F: [phone]  12/28/2021 12:26 PM

## 2021-12-28 NOTE — Progress Notes (Signed)
PROGRESS NOTE    James [name]  GYF:[id number] DOB: 04-12-1956 DOA: 12/25/2021 PCP: Colon Branch, MD    Brief Narrative:   James [name] is a pleasant 66 y.o. male with medical history significant for coronary artery disease, cirrhosis, hypertension, OSA with CPAP intolerance, well-controlled type 2 diabetes mellitus, and CKD 3B, now presenting to the emergency department with pain, redness, and swelling involving the left first toe.  Patient reports that he had an ingrown toenail that he removed himself 2 weeks ago, and then noted this morning that the toe was red, sore, and swollen. Found to have bacteremia  Assessment and Plan:  Left 1st toe osteomyelitis  - Presents with left great toe pain, swelling, and redness after removing his ingrown nail 2 weeks ago  - Podiatry was consulted: s/p AMPUTATION GREAT TOE (Left) - Continue antibiotics -MRI ankle pending   MSSA bacteremia -IV abx -echo pending -id consult   AKI on CKD IIIa - improved with IVF   CAD  - Stent placed in April 2022  - No anginal complaints  - Continue Lipitor and resume plavix   Hypertension  - Continue Norvasc and Coreg     type II DM  - A1c was 5.9% in May 2023     Cirrhosis  - Appears compensated    Obesity Estimated body mass index is 34.88 kg/m as calculated from the following:   Height as of this encounter: 5' 10.98" (1.803 m).   Weight as of this encounter: 113.4 kg.    DVT prophylaxis: heparin injection 5,000 Units Start: 12/26/21 2200 SCDs Start: 12/26/21 0433    Code Status: Full Code   Disposition Plan:  Level of care: Med-Surg Status is: Inpatient Remains inpatient appropriate because: podiatry eval    Consultants:  Podiatry ID   Subjective: No complaints  Objective: Vitals:   12/27/21 1619 12/27/21 2106 12/27/21 2351 12/28/21 0832  BP: 120/83 131/84 (!) 143/82 123/81  Pulse: 75 73 75 78  Resp: 18 19 18 19   Temp:  98.3 F (36.8 C) 97.7 F (36.5 C) 99.2 F  (37.3 C)  TempSrc:  Oral    SpO2: 92% 92% 93% 91%  Weight:      Height:        Intake/Output Summary (Last 24 hours) at 12/28/2021 1115 Last data filed at 12/28/2021 0900 Gross per 24 hour  Intake 1050 ml  Output 405 ml  Net 645 ml   Filed Weights   12/25/21 1648 12/27/21 1223  Weight: 113.4 kg 113.4 kg    Examination:  General: Appearance:    Obese male in no acute distress     Lungs:     respirations unlabored  Heart:    Normal heart rate.   MS:   Left foot wrapped  Neurologic:   Awake, alert, oriented x 3. No apparent focal neurological           defect.       Data Reviewed: I have personally reviewed following labs and imaging studies  CBC: Recent Labs  Lab 12/25/21 1734 12/26/21 0450 12/27/21 0109 12/28/21 0041  WBC 10.0 8.7 8.3 6.2  NEUTROABS 7.4  --   --   --   HGB 14.2 13.5 12.3* 12.6*  HCT 41.3 40.7 36.2* 37.0*  MCV 86.9 87.7 87.9 89.2  PLT 159 118* 125* 916*   Basic Metabolic Panel: Recent Labs  Lab 12/25/21 1734 12/26/21 0450 12/27/21 0109 12/28/21 0041  NA 135 137 137 140  K  4.4 3.9 4.2 4.2  CL 105 106 108 107  CO2 23 23 22 24   GLUCOSE 176* 127* 125* 120*  BUN 25* 18 17 18   CREATININE 1.80* 1.60* 1.56* 1.53*  CALCIUM 9.7 9.6 9.5 9.4   GFR: Estimated Creatinine Clearance: 63.3 mL/min (A) (by C-G formula based on SCr of 1.53 mg/dL (H)). Liver Function Tests: No results for input(s): "AST", "ALT", "ALKPHOS", "BILITOT", "PROT", "ALBUMIN" in the last 168 hours. No results for input(s): "LIPASE", "AMYLASE" in the last 168 hours. No results for input(s): "AMMONIA" in the last 168 hours. Coagulation Profile: No results for input(s): "INR", "PROTIME" in the last 168 hours. Cardiac Enzymes: No results for input(s): "CKTOTAL", "CKMB", "CKMBINDEX", "TROPONINI" in the last 168 hours. BNP (last 3 results) No results for input(s): "PROBNP" in the last 8760 hours. HbA1C: No results for input(s): "HGBA1C" in the last 72 hours. CBG: Recent Labs   Lab 12/27/21 1504 12/27/21 2109 12/27/21 2351 12/28/21 0419 12/28/21 0829  GLUCAP 121* 139* 119* 126* 131*   Lipid Profile: No results for input(s): "CHOL", "HDL", "LDLCALC", "TRIG", "CHOLHDL", "LDLDIRECT" in the last 72 hours. Thyroid Function Tests: No results for input(s): "TSH", "T4TOTAL", "FREET4", "T3FREE", "THYROIDAB" in the last 72 hours. Anemia Panel: No results for input(s): "VITAMINB12", "FOLATE", "FERRITIN", "TIBC", "IRON", "RETICCTPCT" in the last 72 hours. Sepsis Labs: Recent Labs  Lab 12/25/21 1743  LATICACIDVEN 2.0*    Recent Results (from the past 240 hour(s))  Blood Cultures x 2 sites     Status: Abnormal   Collection Time: 12/25/21  5:48 PM   Specimen: BLOOD  Result Value Ref Range Status   Specimen Description   Final    BLOOD RIGHT ANTECUBITAL Performed at [facility], [address] [place], [place] [address]    Special Requests   Final    BOTTLES DRAWN AEROBIC AND ANAEROBIC Blood Culture adequate volume Performed at [facility], [address] [place], [place] [address]    Culture  Setup Time   Final    GRAM POSITIVE COCCI IN CLUSTERS CRITICAL RESULT CALLED TO, READ BACK BY AND VERIFIED WITH: PHARMD A. Caffie Pinto @1750  FH IN BOTH AEROBIC AND ANAEROBIC BOTTLES Performed at [facility], [place] [address] [place], [place] [address]    Culture STAPHYLOCOCCUS AUREUS (A)  Final   Report Status 12/28/2021 FINAL  Final   Organism ID, Bacteria STAPHYLOCOCCUS AUREUS  Final      Susceptibility   Staphylococcus aureus - MIC*    CIPROFLOXACIN <=0.5 SENSITIVE Sensitive     ERYTHROMYCIN <=0.25 SENSITIVE Sensitive     GENTAMICIN <=0.5 SENSITIVE Sensitive     OXACILLIN 0.5 SENSITIVE Sensitive     TETRACYCLINE <=1 SENSITIVE Sensitive     VANCOMYCIN <=0.5 SENSITIVE Sensitive     TRIMETH/SULFA <=10 SENSITIVE Sensitive     CLINDAMYCIN <=0.25 SENSITIVE Sensitive     RIFAMPIN <=0.5 SENSITIVE Sensitive     Inducible Clindamycin  NEGATIVE Sensitive     * STAPHYLOCOCCUS AUREUS  Blood Culture ID Panel (Reflexed)     Status: Abnormal   Collection Time: 12/25/21  5:48 PM  Result Value Ref Range Status   Enterococcus faecalis NOT DETECTED NOT DETECTED Final   Enterococcus Faecium NOT DETECTED NOT DETECTED Final   Listeria monocytogenes NOT DETECTED NOT DETECTED Final   Staphylococcus species DETECTED (A) NOT DETECTED Final    Comment: CRITICAL RESULT CALLED TO, READ BACK BY AND VERIFIED WITH: PHARMD A. MEYER [id number] @1750  FH  Staphylococcus aureus (BCID) DETECTED (A) NOT DETECTED Final    Comment: CRITICAL RESULT CALLED TO, READ BACK BY AND VERIFIED WITH: PHARMD A. WYOVZ [id number] @1750  FH    Staphylococcus epidermidis NOT DETECTED NOT DETECTED Final   Staphylococcus lugdunensis NOT DETECTED NOT DETECTED Final   Streptococcus species NOT DETECTED NOT DETECTED Final   Streptococcus agalactiae NOT DETECTED NOT DETECTED Final   Streptococcus pneumoniae NOT DETECTED NOT DETECTED Final   Streptococcus pyogenes NOT DETECTED NOT DETECTED Final   A.calcoaceticus-baumannii NOT DETECTED NOT DETECTED Final   Bacteroides fragilis NOT DETECTED NOT DETECTED Final   Enterobacterales NOT DETECTED NOT DETECTED Final   Enterobacter cloacae complex NOT DETECTED NOT DETECTED Final   Escherichia coli NOT DETECTED NOT DETECTED Final   Klebsiella aerogenes NOT DETECTED NOT DETECTED Final   Klebsiella oxytoca NOT DETECTED NOT DETECTED Final   Klebsiella pneumoniae NOT DETECTED NOT DETECTED Final   Proteus species NOT DETECTED NOT DETECTED Final   Salmonella species NOT DETECTED NOT DETECTED Final   Serratia marcescens NOT DETECTED NOT DETECTED Final   Haemophilus influenzae NOT DETECTED NOT DETECTED Final   Neisseria meningitidis NOT DETECTED NOT DETECTED Final   Pseudomonas aeruginosa NOT DETECTED NOT DETECTED Final   Stenotrophomonas maltophilia NOT DETECTED NOT DETECTED Final   Candida albicans NOT DETECTED NOT DETECTED Final    Candida auris NOT DETECTED NOT DETECTED Final   Candida glabrata NOT DETECTED NOT DETECTED Final   Candida krusei NOT DETECTED NOT DETECTED Final   Candida parapsilosis NOT DETECTED NOT DETECTED Final   Candida tropicalis NOT DETECTED NOT DETECTED Final   Cryptococcus neoformans/gattii NOT DETECTED NOT DETECTED Final   Meth resistant mecA/C and MREJ NOT DETECTED NOT DETECTED Final    Comment: Performed at [facility], [address], [place], [place] [address]  Blood Cultures x 2 sites     Status: Abnormal   Collection Time: 12/25/21  6:21 PM   Specimen: BLOOD RIGHT HAND  Result Value Ref Range Status   Specimen Description   Final    BLOOD RIGHT HAND Performed at [facility], [address] [place], [place] [address]    Special Requests   Final    BOTTLES DRAWN AEROBIC AND ANAEROBIC Blood Culture adequate volume Performed at [facility], [address] [place], [place] [address]    Culture  Setup Time   Final    GRAM POSITIVE COCCI IN CLUSTERS AEROBIC BOTTLE ONLY CRITICAL VALUE NOTED.  VALUE IS CONSISTENT WITH PREVIOUSLY REPORTED AND CALLED VALUE. CRITICAL RESULT CALLED TO, READ BACK BY AND VERIFIED WITH: PHARMD A. NOBSJ [id number] @1750  FH    Culture (A)  Final    STAPHYLOCOCCUS AUREUS SUSCEPTIBILITIES PERFORMED ON PREVIOUS CULTURE WITHIN THE LAST 5 DAYS. Performed at [facility], [place] [address] [place],  [address]    Report Status 12/28/2021 FINAL  Final  Surgical pcr screen     Status: Abnormal   Collection Time: 12/26/21  5:21 AM   Specimen: Nasal Mucosa; Nasal Swab  Result Value Ref Range Status   MRSA, PCR POSITIVE (A) NEGATIVE Final    Comment: RESULT CALLED TO, READ BACK BY AND VERIFIED WITH: RN KAY BROWN 12/26/21@6 :50 BY TW    Staphylococcus aureus POSITIVE (A) NEGATIVE Final    Comment: (NOTE) The Xpert SA Assay (FDA approved for NASAL specimens in patients 81 years of age and older), is one component of a  comprehensive surveillance program. It is not intended to  diagnose infection nor to guide or monitor treatment. Performed at [facility], [place] [address] [place], [place] [address]   Culture, blood (Routine X 2) w Reflex to ID Panel     Status: None (Preliminary result)   Collection Time: 12/27/21  7:15 AM   Specimen: BLOOD RIGHT HAND  Result Value Ref Range Status   Specimen Description BLOOD RIGHT HAND  Final   Special Requests   Final    BOTTLES DRAWN AEROBIC AND ANAEROBIC Blood Culture adequate volume   Culture   Final    NO GROWTH < 24 HOURS Performed at [facility], [place] [address], [place], [place] [address]    Report Status PENDING  Incomplete  Culture, blood (Routine X 2) w Reflex to ID Panel     Status: None (Preliminary result)   Collection Time: 12/27/21  7:15 AM   Specimen: BLOOD LEFT HAND  Result Value Ref Range Status   Specimen Description BLOOD LEFT HAND  Final   Special Requests   Final    BOTTLES DRAWN AEROBIC AND ANAEROBIC Blood Culture results may not be optimal due to an excessive volume of blood received in culture bottles   Culture   Final    NO GROWTH < 24 HOURS Performed at [facility], [place] [address], [place], [place] [address]    Report Status PENDING  Incomplete  Aerobic/Anaerobic Culture w Gram Stain (surgical/deep wound)     Status: None (Preliminary result)   Collection Time: 12/27/21  1:23 PM   Specimen: Soft Tissue, Other  Result Value Ref Range Status   Specimen Description OTHER  Final   Special Requests NONE  Final   Gram Stain   Final    NO SQUAMOUS EPITHELIAL CELLS SEEN NO WBC SEEN NO ORGANISMS SEEN Performed at [facility], [place] [address], [place], [place] [address]    Culture PENDING  Incomplete   Report Status PENDING  Incomplete         Radiology Studies: DG Foot 2 Views Left  Result Date: 12/27/2021 CLINICAL DATA:  Postop check. EXAM: LEFT FOOT - 2 VIEW COMPARISON:  12/25/2021. FINDINGS: Since  the previous exam, the distal phalanx of the great toe has been resected along with a portion of the distal aspect of the proximal phalanx. Resected bone margin is sharp. No areas of bone resorption is seen to suggest additional regions of osteomyelitis. Soft tissue swelling overlies the resected great toe consistent with postoperative edema. No soft tissue air. IMPRESSION: 1. Resection of the great toe distal phalanx and a portion of the distal aspect of the proximal phalanx. No evidence of an operative complication. 2. No current findings of osteomyelitis. Electronically Signed   By: Lajean Manes M.D.   On: 12/27/2021 15:15   MR FOOT LEFT WO CONTRAST  Result Date: 12/27/2021 CLINICAL DATA:  Diabetic with foot swelling. Osteomyelitis suspected. Pain, erythema and swelling of the great toe. EXAM: MRI OF THE LEFT FOOT WITHOUT CONTRAST TECHNIQUE: Multiplanar, multisequence MR imaging of the left forefoot was performed. No intravenous contrast was administered. COMPARISON:  Radiographs 12/25/2021 and 08/04/2016. FINDINGS: Bones/Joint/Cartilage In correlation with the recent radiographs, there is a transverse fracture through the distal aspect of the distal 1st phalanx. There is associated nonspecific T2 hyperintensity throughout the distal phalanx. No definite cortical destruction. There are mild degenerative changes at the interphalangeal and metatarsal-phalangeal joints of the great toe. No significant abnormalities of the additional toes or metatarsals. No significant joint  effusions. Ligaments Intact Lisfranc ligament and collateral ligaments of the metatarsophalangeal joints. Muscles and Tendons Nonspecific T2 hyperintensity throughout the forefoot musculature, likely due to diabetic myopathy. The tendons appear intact without significant tenosynovitis. Soft tissues No focal forefoot fluid collection. Mild soft tissue swelling in the great toe with questionable skin ulceration at the base of the nail bed, best  seen on the sagittal images. IMPRESSION: 1. Transverse fracture through the tuft of the distal 1st phalanx. 2. Associated marrow T2 hyperintensity throughout the distal 1st phalanx is nonspecific, and would be expected in the setting of a fracture. Superimposed osteomyelitis difficult to exclude in this setting. That possibility would be greater if there is a superimposed skin ulcer dorsally as suggested on the sagittal images. Correlate clinically. 3. No focal fluid collections. Electronically Signed   By: Richardean Sale M.D.   On: 12/27/2021 08:26        Scheduled Meds:  (feeding supplement) PROSource Plus  30 mL Oral BID   amLODipine  5 mg Oral Daily   atorvastatin  80 mg Oral QHS   carvedilol  12.5 mg Oral BID WC   Chlorhexidine Gluconate Cloth  6 each Topical Q0600   clonazePAM  0.5 mg Oral QHS   DULoxetine  60 mg Oral QHS   gabapentin  300 mg Oral BID   heparin injection (subcutaneous)  5,000 Units Subcutaneous Q8H   insulin aspart  0-6 Units Subcutaneous Q4H   isosorbide mononitrate  60 mg Oral Daily   multivitamin with minerals  1 tablet Oral Daily   mupirocin ointment  1 Application Nasal BID   pantoprazole  40 mg Oral Daily   Continuous Infusions:  sodium chloride 20 mL/hr at 12/25/21 1907    ceFAZolin (ANCEF) IV 2 g (12/28/21 0545)     LOS: 3 days    Time spent: 45 minutes spent on chart review, discussion with nursing staff, consultants, updating family and interview/physical exam; more than 50% of that time was spent in counseling and/or coordination of care.    Geradine Girt, DO [facility] Available via Epic secure chat 7am-7pm After these hours, please refer to coverage provider listed on [email or web address] 12/28/2021, 11:15 AM

## 2021-12-29 ENCOUNTER — Inpatient Hospital Stay (HOSPITAL_COMMUNITY): Payer: 59

## 2021-12-29 DIAGNOSIS — L0390 Cellulitis, unspecified: Secondary | ICD-10-CM

## 2021-12-29 DIAGNOSIS — B9561 Methicillin susceptible Staphylococcus aureus infection as the cause of diseases classified elsewhere: Secondary | ICD-10-CM | POA: Diagnosis not present

## 2021-12-29 DIAGNOSIS — E1122 Type 2 diabetes mellitus with diabetic chronic kidney disease: Secondary | ICD-10-CM | POA: Diagnosis not present

## 2021-12-29 DIAGNOSIS — E11628 Type 2 diabetes mellitus with other skin complications: Secondary | ICD-10-CM | POA: Diagnosis not present

## 2021-12-29 DIAGNOSIS — M869 Osteomyelitis, unspecified: Secondary | ICD-10-CM | POA: Diagnosis not present

## 2021-12-29 DIAGNOSIS — N179 Acute kidney failure, unspecified: Secondary | ICD-10-CM | POA: Diagnosis not present

## 2021-12-29 DIAGNOSIS — R7881 Bacteremia: Secondary | ICD-10-CM | POA: Diagnosis not present

## 2021-12-29 LAB — GLUCOSE, CAPILLARY
Glucose-Capillary: 101 mg/dL — ABNORMAL HIGH (ref 70–99)
Glucose-Capillary: 102 mg/dL — ABNORMAL HIGH (ref 70–99)
Glucose-Capillary: 105 mg/dL — ABNORMAL HIGH (ref 70–99)
Glucose-Capillary: 137 mg/dL — ABNORMAL HIGH (ref 70–99)
Glucose-Capillary: 147 mg/dL — ABNORMAL HIGH (ref 70–99)
Glucose-Capillary: 158 mg/dL — ABNORMAL HIGH (ref 70–99)

## 2021-12-29 MED ORDER — OXYCODONE HCL 5 MG PO TABS: 5.0000 mg | ORAL_TABLET | ORAL | Status: DC | PRN

## 2021-12-29 NOTE — Progress Notes (Signed)
[facility] for Infectious Disease  Date of Admission:  12/25/2021           Reason for visit: Follow up on MSSA bacteremia  Current antibiotics: Cefazolin   ASSESSMENT:    66 y.o. male admitted with:  MSSA bacteremia: Due to osteomyelitis of left great toe s/p left great toe amputation 7/1 by podiatry.  6/29 blood cultures positive and repeat cultures 7/1 NGTD. OR cultures also growing Staph aureus.   TTE did not show obvious vegetation.  Fortunately, MRI ankle did not reveal evidence of more proximal infection. Acute kidney injury: Improved. Type 2 DM: A1c in May was 5.9. Sepsis: Due to #1 and improved. RA: on Humira. History of lumbar PLIF with instrumentation in Sept 2020. History of post-traumatic hydrocephalus s/p VP shunt in ~2007. History of left hip replacement about 15 years ago. Cirrhosis: Well  compensated.  RECOMMENDATIONS:    Continue cefazolin Follow repeat blood cultures I would recommend a TEE in this setting Follow up ABI Lab monitoring Wound care Glycemic control Will follow   Principal Problem:   MSSA bacteremia Active Problems:   Diabetes with neuropathy   Coronary artery disease   Cirrhosis (HCC)   Hypertension   Osteomyelitis of great toe of left foot (HCC)   Acute renal failure superimposed on stage 3a chronic kidney disease (HCC)    MEDICATIONS:    Scheduled Meds:  (feeding supplement) PROSource Plus  30 mL Oral BID   amLODipine  5 mg Oral Daily   atorvastatin  80 mg Oral QHS   carvedilol  12.5 mg Oral BID WC   Chlorhexidine Gluconate Cloth  6 each Topical Q0600   clonazePAM  0.5 mg Oral QHS   clopidogrel  75 mg Oral Daily   DULoxetine  60 mg Oral QHS   gabapentin  300 mg Oral BID   heparin injection (subcutaneous)  5,000 Units Subcutaneous Q8H   insulin aspart  0-15 Units Subcutaneous TID WC   insulin aspart  0-5 Units Subcutaneous QHS   isosorbide mononitrate  60 mg Oral Daily   multivitamin with minerals  1 tablet  Oral Daily   mupirocin ointment  1 Application Nasal BID   pantoprazole  40 mg Oral Daily   Continuous Infusions:  sodium chloride 20 mL/hr at 12/25/21 1907    ceFAZolin (ANCEF) IV 2 g (12/29/21 0509)   PRN Meds:.sodium chloride, acetaminophen **OR** acetaminophen, HYDROmorphone (DILAUDID) injection  SUBJECTIVE:   24 hour events:  No major events noted  Patient reports feeling better this morning.  Trying to get himself to wake up.  He reports some improvement in his left ankle discomfort.  He was reassured that his MRI was concerning for more proximal infection.  We discussed the need for TEE and he is agreeable.  Review of Systems  All other systems reviewed and are negative.     OBJECTIVE:   Blood pressure 109/67, pulse 75, temperature 98.3 F (36.8 C), resp. rate 18, height 5' 10.98" (1.803 m), weight 113.4 kg, SpO2 95 %. Body mass index is 34.88 kg/m.  Physical Exam Constitutional:      General: He is not in acute distress.    Appearance: Normal appearance.  HENT:     Head: Normocephalic and atraumatic.  Eyes:     Extraocular Movements: Extraocular movements intact.     Conjunctiva/sclera: Conjunctivae normal.  Pulmonary:     Effort: Pulmonary effort is normal. No respiratory distress.  Abdominal:     General:  There is no distension.     Palpations: Abdomen is soft.     Tenderness: There is no abdominal tenderness.  Musculoskeletal:     Cervical back: Normal range of motion and neck supple.     Comments: Status post left great toe amputation.  Foot is otherwise wrapped in Ace bandage.  He is able to wiggle his toes.  Skin:    General: Skin is warm and dry.  Neurological:     General: No focal deficit present.     Mental Status: He is alert and oriented to person, place, and time.  Psychiatric:        Mood and Affect: Mood normal.        Behavior: Behavior normal.      Lab Results: Lab Results  Component Value Date   WBC 6.2 12/28/2021   HGB 12.6 (L)  12/28/2021   HCT 37.0 (L) 12/28/2021   MCV 89.2 12/28/2021   PLT 116 (L) 12/28/2021    Lab Results  Component Value Date   NA 140 12/28/2021   K 4.2 12/28/2021   CO2 24 12/28/2021   GLUCOSE 120 (H) 12/28/2021   BUN 18 12/28/2021   CREATININE 1.53 (H) 12/28/2021   CALCIUM 9.4 12/28/2021   GFRNONAA 51 (L) 12/28/2021   GFRAA 66 08/08/2020    Lab Results  Component Value Date   ALT 18 10/24/2021   AST 15 10/24/2021   ALKPHOS 78 10/24/2021   BILITOT 1.8 (H) 10/24/2021       Component Value Date/Time   CRP 10.1 (H) 12/27/2021 0109       Component Value Date/Time   ESRSEDRATE 31 (H) 12/27/2021 0109     I have reviewed the micro and lab results in Epic.  Imaging: ECHOCARDIOGRAM COMPLETE  Result Date: 12/28/2021    ECHOCARDIOGRAM REPORT   Patient Name:   James [name] Climer Date of Exam: 12/28/2021 Medical Rec #:  [id number]       Height:       71.0 in Accession #:    [id number]      Weight:       250.0 lb Date of Birth:  [birth year]        BSA:          2.318 m Patient Age:    24 years        BP:           143/82 mmHg Patient Gender: M               HR:           72 bpm. Exam Location:  Inpatient Procedure: 2D Echo, Cardiac Doppler and Color Doppler Indications:    Bacteremia  History:        Patient has prior history of Echocardiogram examinations. CAD;                 Risk Factors:Hypertension, Diabetes and Sleep Apnea.  Sonographer:    Jyl Heinz Referring Phys: [id number] [place]  1. Left ventricular ejection fraction, by estimation, is 60 to 65%. The left ventricle has normal function. The left ventricle has no regional wall motion abnormalities. Left ventricular diastolic parameters are consistent with Grade I diastolic dysfunction (impaired relaxation).  2. Right ventricular systolic function is normal. The right ventricular size is normal.  3. The mitral valve is normal in structure. Trivial mitral valve regurgitation. No evidence of mitral stenosis.  4. The aortic  valve is tricuspid. Aortic valve  regurgitation is not visualized. Aortic valve sclerosis/calcification is present, without any evidence of aortic stenosis.  5. The inferior vena cava is normal in size with greater than 50% respiratory variability, suggesting right atrial pressure of 3 mmHg. FINDINGS  Left Ventricle: Left ventricular ejection fraction, by estimation, is 60 to 65%. The left ventricle has normal function. The left ventricle has no regional wall motion abnormalities. The left ventricular internal cavity size was normal in size. There is  no left ventricular hypertrophy. Left ventricular diastolic parameters are consistent with Grade I diastolic dysfunction (impaired relaxation). Right Ventricle: The right ventricular size is normal. Right ventricular systolic function is normal. Left Atrium: Left atrial size was normal in size. Right Atrium: Right atrial size was normal in size. Pericardium: There is no evidence of pericardial effusion. Mitral Valve: The mitral valve is normal in structure. There is mild thickening of the mitral valve leaflet(s). Trivial mitral valve regurgitation. No evidence of mitral valve stenosis. Tricuspid Valve: The tricuspid valve is normal in structure. Tricuspid valve regurgitation is trivial. No evidence of tricuspid stenosis. Aortic Valve: The aortic valve is tricuspid. Aortic valve regurgitation is not visualized. Aortic valve sclerosis/calcification is present, without any evidence of aortic stenosis. Aortic valve peak gradient measures 5.3 mmHg. Pulmonic Valve: The pulmonic valve was normal in structure. Pulmonic valve regurgitation is not visualized. No evidence of pulmonic stenosis. Aorta: The aortic root is normal in size and structure. Venous: The inferior vena cava is normal in size with greater than 50% respiratory variability, suggesting right atrial pressure of 3 mmHg. IAS/Shunts: No atrial level shunt detected by color flow Doppler.  LEFT VENTRICLE PLAX 2D LVIDd:          4.70 cm     Diastology LVIDs:         2.90 cm     LV e' medial:    5.33 cm/s LV PW:         1.10 cm     LV E/e' medial:  9.1 LV IVS:        0.90 cm     LV e' lateral:   6.85 cm/s LVOT diam:     2.00 cm     LV E/e' lateral: 7.1 LV SV:         46 LV SV Index:   20 LVOT Area:     3.14 cm  LV Volumes (MOD) LV vol d, MOD A2C: 79.0 ml LV vol d, MOD A4C: 88.8 ml LV vol s, MOD A2C: 31.8 ml LV vol s, MOD A4C: 38.7 ml LV SV MOD A2C:     47.2 ml LV SV MOD A4C:     88.8 ml LV SV MOD BP:      49.1 ml RIGHT VENTRICLE             IVC RV Basal diam:  1.90 cm     IVC diam: 1.30 cm RV Mid diam:    1.70 cm RV S prime:     10.10 cm/s TAPSE (M-mode): 1.6 cm LEFT ATRIUM             Index        RIGHT ATRIUM           Index LA diam:        3.90 cm 1.68 cm/m   RA Area:     10.70 cm LA Vol (A2C):   35.8 ml 15.44 ml/m  RA Volume:   19.00 ml  8.20 ml/m LA Vol (A4C):  31.7 ml 13.67 ml/m LA Biplane Vol: 34.5 ml 14.88 ml/m  AORTIC VALVE AV Area (Vmax): 2.15 cm AV Vmax:        115.00 cm/s AV Peak Grad:   5.3 mmHg LVOT Vmax:      78.80 cm/s LVOT Vmean:     58.600 cm/s LVOT VTI:       0.145 m  AORTA Ao Root diam: 3.40 cm Ao Asc diam:  3.20 cm MITRAL VALVE MV Area (PHT): 3.89 cm    SHUNTS MV Decel Time: 195 msec    Systemic VTI:  0.14 m MV E velocity: 48.40 cm/s  Systemic Diam: 2.00 cm MV A velocity: 65.70 cm/s MV E/A ratio:  0.74 Kirk Ruths MD Electronically signed by Kirk Ruths MD Signature Date/Time: 12/28/2021/2:44:36 PM    Final    MR ANKLE LEFT WO CONTRAST  Result Date: 12/28/2021 CLINICAL DATA:  Septic arthritis suspected, ankle, xray done EXAM: MRI OF THE LEFT ANKLE WITHOUT CONTRAST TECHNIQUE: Multiplanar, multisequence MR imaging of the ankle was performed. No intravenous contrast was administered. COMPARISON:  X-ray 12/27/2021 FINDINGS: Technical Note: Despite efforts by the technologist and patient, motion artifact is present on today's exam and could not be eliminated. This reduces exam sensitivity and  specificity. Bones/Joint/Cartilage No acute fracture. No dislocation. No bone marrow edema or marrow replacement. Small bone infarct within the distal tibial metaphysis. No suspicious bone lesion. Mild-to-moderate degenerative changes of the midfoot. Small posterior subtalar joint effusion. Trace calcaneocuboid joint effusion. Trace joint fluid across the tarsometatarsal joints. No large joint effusion. No tibiotalar joint effusion. Ligaments Ankle ligaments are grossly intact, evaluation degraded by motion. Intact Lisfranc ligament. Muscles and Tendons Diffuse edema-like signal throughout the foot musculature likely related to diabetic myopathy/denervation. Trace tenosynovitis of the tibialis posterior tendon, nonspecific. Tendinous structures about the ankle are intact. Soft tissues No soft tissue wound or ulceration.  No organized fluid collection. IMPRESSION: 1. Motion degraded exam. 2. No acute osseous abnormality or evidence of osteomyelitis. 3. Mild-to-moderate degenerative changes of the midfoot. 4. Scattered small joint effusions in the hindfoot and midfoot, likely degenerative/reactive. No sizable joint effusion to suggest septic arthritis. 5. Trace tenosynovitis of the tibialis posterior tendon, nonspecific. Electronically Signed   By: Davina Poke D.O.   On: 12/28/2021 14:02   DG Foot 2 Views Left  Result Date: 12/27/2021 CLINICAL DATA:  Postop check. EXAM: LEFT FOOT - 2 VIEW COMPARISON:  12/25/2021. FINDINGS: Since the previous exam, the distal phalanx of the great toe has been resected along with a portion of the distal aspect of the proximal phalanx. Resected bone margin is sharp. No areas of bone resorption is seen to suggest additional regions of osteomyelitis. Soft tissue swelling overlies the resected great toe consistent with postoperative edema. No soft tissue air. IMPRESSION: 1. Resection of the great toe distal phalanx and a portion of the distal aspect of the proximal phalanx. No  evidence of an operative complication. 2. No current findings of osteomyelitis. Electronically Signed   By: Lajean Manes M.D.   On: 12/27/2021 15:15     Imaging independently reviewed in Epic.    [facility] [phone] pager 12/29/2021, 6:29 AM

## 2021-12-29 NOTE — Consult Note (Signed)
   [facility] CM Inpatient Consult   12/29/2021  James [name] Apr 29, 1956 [id number]   [place] Organization [ACO] Patient: [employer] Employee plan  Primary Care Provider:  Colon Branch, MD, [facility] this is an Embedded provider that is listed to provide the [facility] follow up call and visits.     Plan: Continue to follow for progress and needs. Patient with ongoing med/surg management noted. Will continue to assess for needs.   For additional questions or referrals please contact:   Natividad Brood, RN BSN [facility] Liaison  [phone] business mobile phone Toll free office [phone]  Fax number: [phone] [email or web address]

## 2021-12-29 NOTE — Progress Notes (Signed)
PROGRESS NOTE    James [name]  NFA:[id number] DOB: 07-18-1955 DOA: 12/25/2021 PCP: Colon Branch, MD    Brief Narrative:   James [name] is a pleasant 66 y.o. male with medical history significant for coronary artery disease, cirrhosis, hypertension, OSA with CPAP intolerance, well-controlled type 2 diabetes mellitus, and CKD 3B, now presenting to the emergency department with pain, redness, and swelling involving the left first toe.  Patient reports that he had an ingrown toenail that he removed himself 2 weeks ago, and then noted this morning that the toe was red, sore, and swollen. Found to have bacteremia.  TEE pending for Thursday.  Assessment and Plan:  Left 1st toe osteomyelitis  - Presents with left great toe pain, swelling, and redness after removing his ingrown nail 2 weeks ago  - Podiatry was consulted: s/p AMPUTATION GREAT TOE (Left) - Continue antibiotics -MRI ankle w/ infection -TTE unrevealing-- TEE pending for Thursday -ABI pending    MSSA bacteremia -IV abx -TEE pending -id consult   AKI on CKD IIIa - improved with IVF   CAD  - Stent placed in April 2022  - No anginal complaints  - Continue Lipitor and resume plavix   Hypertension  - Continue Norvasc and Coreg     type II DM  - A1c was 5.9% in May 2023     Cirrhosis  - Appears compensated    Obesity Estimated body mass index is 34.88 kg/m as calculated from the following:   Height as of this encounter: 5' 10.98" (1.803 m).   Weight as of this encounter: 113.4 kg.    DVT prophylaxis: heparin injection 5,000 Units Start: 12/26/21 2200 SCDs Start: 12/26/21 0433    Code Status: Full Code   Disposition Plan:  Level of care: Med-Surg Status is: Inpatient Remains inpatient appropriate because: needs TEE    Consultants:  Podiatry ID   Subjective: Up walking around the room  Objective: Vitals:   12/28/21 0832 12/28/21 1508 12/29/21 0858 12/29/21 1203  BP: 123/81 109/67 122/82 120/81   Pulse: 78 75 76 73  Resp: 19 18 18 19   Temp: 99.2 F (37.3 C) 98.3 F (36.8 C) 99 F (37.2 C) 98 F (36.7 C)  TempSrc:      SpO2: 91% 95% 92% 94%  Weight:      Height:        Intake/Output Summary (Last 24 hours) at 12/29/2021 1324 Last data filed at 12/29/2021 1100 Gross per 24 hour  Intake 240 ml  Output --  Net 240 ml   Filed Weights   12/25/21 1648 12/27/21 1223  Weight: 113.4 kg 113.4 kg    Examination:   General: Appearance:    Obese male in no acute distress     Lungs:      respirations unlabored  Heart:    Normal heart rate.   MS:   Left partial toe  Neurologic:   Awake, alert, oriented x 3. No apparent focal neurological           defect.       Data Reviewed: I have personally reviewed following labs and imaging studies  CBC: Recent Labs  Lab 12/25/21 1734 12/26/21 0450 12/27/21 0109 12/28/21 0041  WBC 10.0 8.7 8.3 6.2  NEUTROABS 7.4  --   --   --   HGB 14.2 13.5 12.3* 12.6*  HCT 41.3 40.7 36.2* 37.0*  MCV 86.9 87.7 87.9 89.2  PLT 159 118* 125* 469*   Basic Metabolic  Panel: Recent Labs  Lab 12/25/21 1734 12/26/21 0450 12/27/21 0109 12/28/21 0041  NA 135 137 137 140  K 4.4 3.9 4.2 4.2  CL 105 106 108 107  CO2 23 23 22 24   GLUCOSE 176* 127* 125* 120*  BUN 25* 18 17 18   CREATININE 1.80* 1.60* 1.56* 1.53*  CALCIUM 9.7 9.6 9.5 9.4   GFR: Estimated Creatinine Clearance: 63.3 mL/min (A) (by C-G formula based on SCr of 1.53 mg/dL (H)). Liver Function Tests: No results for input(s): "AST", "ALT", "ALKPHOS", "BILITOT", "PROT", "ALBUMIN" in the last 168 hours. No results for input(s): "LIPASE", "AMYLASE" in the last 168 hours. No results for input(s): "AMMONIA" in the last 168 hours. Coagulation Profile: No results for input(s): "INR", "PROTIME" in the last 168 hours. Cardiac Enzymes: No results for input(s): "CKTOTAL", "CKMB", "CKMBINDEX", "TROPONINI" in the last 168 hours. BNP (last 3 results) No results for input(s): "PROBNP" in the last  8760 hours. HbA1C: No results for input(s): "HGBA1C" in the last 72 hours. CBG: Recent Labs  Lab 12/28/21 2056 12/29/21 0006 12/29/21 0458 12/29/21 0856 12/29/21 1201  GLUCAP 172* 102* 105* 101* 158*   Lipid Profile: No results for input(s): "CHOL", "HDL", "LDLCALC", "TRIG", "CHOLHDL", "LDLDIRECT" in the last 72 hours. Thyroid Function Tests: No results for input(s): "TSH", "T4TOTAL", "FREET4", "T3FREE", "THYROIDAB" in the last 72 hours. Anemia Panel: No results for input(s): "VITAMINB12", "FOLATE", "FERRITIN", "TIBC", "IRON", "RETICCTPCT" in the last 72 hours. Sepsis Labs: Recent Labs  Lab 12/25/21 1743  LATICACIDVEN 2.0*    Recent Results (from the past 240 hour(s))  Blood Cultures x 2 sites     Status: Abnormal   Collection Time: 12/25/21  5:48 PM   Specimen: BLOOD  Result Value Ref Range Status   Specimen Description   Final    BLOOD RIGHT ANTECUBITAL Performed at [facility], [address] [place], [place] [address]    Special Requests   Final    BOTTLES DRAWN AEROBIC AND ANAEROBIC Blood Culture adequate volume Performed at [facility], [address] [place], [place] [address]    Culture  Setup Time   Final    GRAM POSITIVE COCCI IN CLUSTERS CRITICAL RESULT CALLED TO, READ BACK BY AND VERIFIED WITH: PHARMD A. Caffie Pinto @1750  FH IN BOTH AEROBIC AND ANAEROBIC BOTTLES Performed at [facility], [place] [address] [place], [place] [address]    Culture STAPHYLOCOCCUS AUREUS (A)  Final   Report Status 12/28/2021 FINAL  Final   Organism ID, Bacteria STAPHYLOCOCCUS AUREUS  Final      Susceptibility   Staphylococcus aureus - MIC*    CIPROFLOXACIN <=0.5 SENSITIVE Sensitive     ERYTHROMYCIN <=0.25 SENSITIVE Sensitive     GENTAMICIN <=0.5 SENSITIVE Sensitive     OXACILLIN 0.5 SENSITIVE Sensitive     TETRACYCLINE <=1 SENSITIVE Sensitive     VANCOMYCIN <=0.5 SENSITIVE Sensitive     TRIMETH/SULFA <=10 SENSITIVE Sensitive     CLINDAMYCIN  <=0.25 SENSITIVE Sensitive     RIFAMPIN <=0.5 SENSITIVE Sensitive     Inducible Clindamycin NEGATIVE Sensitive     * STAPHYLOCOCCUS AUREUS  Blood Culture ID Panel (Reflexed)     Status: Abnormal   Collection Time: 12/25/21  5:48 PM  Result Value Ref Range Status   Enterococcus faecalis NOT DETECTED NOT DETECTED Final   Enterococcus Faecium NOT DETECTED NOT DETECTED Final   Listeria monocytogenes NOT DETECTED NOT DETECTED Final   Staphylococcus species DETECTED (A) NOT DETECTED Final  Comment: CRITICAL RESULT CALLED TO, READ BACK BY AND VERIFIED WITH: PHARMD A. [id number] @1750  FH    Staphylococcus aureus (BCID) DETECTED (A) NOT DETECTED Final    Comment: CRITICAL RESULT CALLED TO, READ BACK BY AND VERIFIED WITH: PHARMD A. QJJHE [id number] @1750  FH    Staphylococcus epidermidis NOT DETECTED NOT DETECTED Final   Staphylococcus lugdunensis NOT DETECTED NOT DETECTED Final   Streptococcus species NOT DETECTED NOT DETECTED Final   Streptococcus agalactiae NOT DETECTED NOT DETECTED Final   Streptococcus pneumoniae NOT DETECTED NOT DETECTED Final   Streptococcus pyogenes NOT DETECTED NOT DETECTED Final   A.calcoaceticus-baumannii NOT DETECTED NOT DETECTED Final   Bacteroides fragilis NOT DETECTED NOT DETECTED Final   Enterobacterales NOT DETECTED NOT DETECTED Final   Enterobacter cloacae complex NOT DETECTED NOT DETECTED Final   Escherichia coli NOT DETECTED NOT DETECTED Final   Klebsiella aerogenes NOT DETECTED NOT DETECTED Final   Klebsiella oxytoca NOT DETECTED NOT DETECTED Final   Klebsiella pneumoniae NOT DETECTED NOT DETECTED Final   Proteus species NOT DETECTED NOT DETECTED Final   Salmonella species NOT DETECTED NOT DETECTED Final   Serratia marcescens NOT DETECTED NOT DETECTED Final   Haemophilus influenzae NOT DETECTED NOT DETECTED Final   Neisseria meningitidis NOT DETECTED NOT DETECTED Final   Pseudomonas aeruginosa NOT DETECTED NOT DETECTED Final   Stenotrophomonas  maltophilia NOT DETECTED NOT DETECTED Final   Candida albicans NOT DETECTED NOT DETECTED Final   Candida auris NOT DETECTED NOT DETECTED Final   Candida glabrata NOT DETECTED NOT DETECTED Final   Candida krusei NOT DETECTED NOT DETECTED Final   Candida parapsilosis NOT DETECTED NOT DETECTED Final   Candida tropicalis NOT DETECTED NOT DETECTED Final   Cryptococcus neoformans/gattii NOT DETECTED NOT DETECTED Final   Meth resistant mecA/C and MREJ NOT DETECTED NOT DETECTED Final    Comment: Performed at [facility], [address] [place], [place] [address]  Blood Cultures x 2 sites     Status: Abnormal   Collection Time: 12/25/21  6:21 PM   Specimen: BLOOD RIGHT HAND  Result Value Ref Range Status   Specimen Description   Final    BLOOD RIGHT HAND Performed at [facility], [address] [place], [place] [address]    Special Requests   Final    BOTTLES DRAWN AEROBIC AND ANAEROBIC Blood Culture adequate volume Performed at [facility], [address] [place], [place] [address]    Culture  Setup Time   Final    GRAM POSITIVE COCCI IN CLUSTERS AEROBIC BOTTLE ONLY CRITICAL VALUE NOTED.  VALUE IS CONSISTENT WITH PREVIOUSLY REPORTED AND CALLED VALUE. CRITICAL RESULT CALLED TO, READ BACK BY AND VERIFIED WITH: PHARMD A. [id number] [id number] @1750  FH    Culture (A)  Final    STAPHYLOCOCCUS AUREUS SUSCEPTIBILITIES PERFORMED ON PREVIOUS CULTURE WITHIN THE LAST 5 DAYS. Performed at [facility], [place] [address] [place], [place] [address]    Report Status 12/28/2021 FINAL  Final  Surgical pcr screen     Status: Abnormal   Collection Time: 12/26/21  5:21 AM   Specimen: Nasal Mucosa; Nasal Swab  Result Value Ref Range Status   MRSA, PCR POSITIVE (A) NEGATIVE Final    Comment: RESULT CALLED TO, READ BACK BY AND VERIFIED WITH: RN KAY BROWN 12/26/21@6 :50 BY TW    Staphylococcus aureus POSITIVE (A) NEGATIVE Final    Comment: (NOTE) The Xpert SA Assay (FDA  approved for NASAL specimens in  patients 73 years of age and older), is one component of a comprehensive surveillance program. It is not intended to diagnose infection nor to guide or monitor treatment. Performed at [facility], [place] [address], [place], [place] [address]   Culture, blood (Routine X 2) w Reflex to ID Panel     Status: None (Preliminary result)   Collection Time: 12/27/21  7:15 AM   Specimen: BLOOD RIGHT HAND  Result Value Ref Range Status   Specimen Description BLOOD RIGHT HAND  Final   Special Requests   Final    BOTTLES DRAWN AEROBIC AND ANAEROBIC Blood Culture adequate volume   Culture   Final    NO GROWTH < 24 HOURS Performed at [facility], [place] [address] [place], [place] [address]    Report Status PENDING  Incomplete  Culture, blood (Routine X 2) w Reflex to ID Panel     Status: None (Preliminary result)   Collection Time: 12/27/21  7:15 AM   Specimen: BLOOD LEFT HAND  Result Value Ref Range Status   Specimen Description BLOOD LEFT HAND  Final   Special Requests   Final    BOTTLES DRAWN AEROBIC AND ANAEROBIC Blood Culture results may not be optimal due to an excessive volume of blood received in culture bottles   Culture   Final    NO GROWTH < 24 HOURS Performed at [facility], [place] [address], [place], [place] [address]    Report Status PENDING  Incomplete  Aerobic/Anaerobic Culture w Gram Stain (surgical/deep wound)     Status: None (Preliminary result)   Collection Time: 12/27/21  1:23 PM   Specimen: Soft Tissue, Other  Result Value Ref Range Status   Specimen Description OTHER  Final   Special Requests NONE  Final   Gram Stain   Final    NO SQUAMOUS EPITHELIAL CELLS SEEN NO WBC SEEN NO ORGANISMS SEEN Performed at [facility], [place] [address], [place], [place] [address]    Culture   Final    RARE STAPHYLOCOCCUS AUREUS CULTURE REINCUBATED FOR BETTER GROWTH RARE GROUP B STREP(S.AGALACTIAE)ISOLATED TESTING AGAINST S.  AGALACTIAE NOT ROUTINELY PERFORMED DUE TO PREDICTABILITY OF AMP/PEN/VAN SUSCEPTIBILITY. NO ANAEROBES ISOLATED; CULTURE IN PROGRESS FOR 5 DAYS    Report Status PENDING  Incomplete         Radiology Studies: VAS [place] ABI WITH/WO TBI  Result Date: 12/29/2021  LOWER EXTREMITY DOPPLER STUDY Patient Name:  OLUWATIMILEYIN VIVIER  Date of Exam:   12/29/2021 Medical Rec #: [id number]        Accession #:    [id number] Date of Birth: December 31, 1955         Patient Gender: M Patient Age:   74 years Exam Location:  [facility] Procedure:      VAS [place] ABI WITH/WO TBI Referring Phys: Jule Ser --------------------------------------------------------------------------------  Indications: Ulceration. High Risk Factors: Hypertension, Diabetes.  Vascular Interventions: 12/27/2021 - Left AMPUTATION GREAT TOE. Comparison Study: No prior studies. Performing Technologist: Carlos Levering RVT  Examination Guidelines: A complete evaluation includes at minimum, Doppler waveform signals and systolic blood pressure reading at the level of bilateral brachial, anterior tibial, and posterior tibial arteries, when vessel segments are accessible. Bilateral testing is considered an integral part of a complete examination. Photoelectric Plethysmograph (PPG) waveforms and toe systolic pressure readings are included as required and additional duplex testing as needed. Limited examinations for reoccurring indications may be performed as noted.  ABI Findings: +---------+------------------+-----+---------+--------+ Right  Rt Pressure (mmHg)IndexWaveform Comment  +---------+------------------+-----+---------+--------+ Brachial 123                    triphasic         +---------+------------------+-----+---------+--------+ PTA      152               1.23 triphasic         +---------+------------------+-----+---------+--------+ DP       15                0.12 triphasic          +---------+------------------+-----+---------+--------+ Great Toe67                0.54                   +---------+------------------+-----+---------+--------+ +---------+------------------+-----+---------+----------+ Left     Lt Pressure (mmHg)IndexWaveform Comment    +---------+------------------+-----+---------+----------+ Brachial 124                    triphasic           +---------+------------------+-----+---------+----------+ PTA      158               1.27 triphasic           +---------+------------------+-----+---------+----------+ DP       158               1.27 triphasic           +---------+------------------+-----+---------+----------+ Great Toe                                Amputation +---------+------------------+-----+---------+----------+ +-------+-----------+-----------+------------+------------+ ABI/TBIToday's ABIToday's TBIPrevious ABIPrevious TBI +-------+-----------+-----------+------------+------------+ Right  1.24       0.54                                +-------+-----------+-----------+------------+------------+ Left   1.27                                           +-------+-----------+-----------+------------+------------+  Summary: Right: Resting right ankle-brachial index is within normal range. No evidence of significant right lower extremity arterial disease. The right toe-brachial index is abnormal. Left: Resting left ankle-brachial index is within normal range. No evidence of significant left lower extremity arterial disease. Unable to obtain TBI due to great toe amputation. *See table(s) above for measurements and observations.     Preliminary    ECHOCARDIOGRAM COMPLETE  Result Date: 12/28/2021    ECHOCARDIOGRAM REPORT   Patient Name:   BELA NYBORG Delduca Date of Exam: 12/28/2021 Medical Rec #:  [id number]       Height:       71.0 in Accession #:    [id number]      Weight:       250.0 lb Date of Birth:  1955-10-31        BSA:           2.318 m Patient Age:    42 years        BP:           143/82 mmHg Patient Gender: M               HR:  72 bpm. Exam Location:  Inpatient Procedure: 2D Echo, Cardiac Doppler and Color Doppler Indications:    Bacteremia  History:        Patient has prior history of Echocardiogram examinations. CAD;                 Risk Factors:Hypertension, Diabetes and Sleep Apnea.  Sonographer:    Jyl Heinz Referring Phys: [id number] [place]  1. Left ventricular ejection fraction, by estimation, is 60 to 65%. The left ventricle has normal function. The left ventricle has no regional wall motion abnormalities. Left ventricular diastolic parameters are consistent with Grade I diastolic dysfunction (impaired relaxation).  2. Right ventricular systolic function is normal. The right ventricular size is normal.  3. The mitral valve is normal in structure. Trivial mitral valve regurgitation. No evidence of mitral stenosis.  4. The aortic valve is tricuspid. Aortic valve regurgitation is not visualized. Aortic valve sclerosis/calcification is present, without any evidence of aortic stenosis.  5. The inferior vena cava is normal in size with greater than 50% respiratory variability, suggesting right atrial pressure of 3 mmHg. FINDINGS  Left Ventricle: Left ventricular ejection fraction, by estimation, is 60 to 65%. The left ventricle has normal function. The left ventricle has no regional wall motion abnormalities. The left ventricular internal cavity size was normal in size. There is  no left ventricular hypertrophy. Left ventricular diastolic parameters are consistent with Grade I diastolic dysfunction (impaired relaxation). Right Ventricle: The right ventricular size is normal. Right ventricular systolic function is normal. Left Atrium: Left atrial size was normal in size. Right Atrium: Right atrial size was normal in size. Pericardium: There is no evidence of pericardial effusion. Mitral Valve: The  mitral valve is normal in structure. There is mild thickening of the mitral valve leaflet(s). Trivial mitral valve regurgitation. No evidence of mitral valve stenosis. Tricuspid Valve: The tricuspid valve is normal in structure. Tricuspid valve regurgitation is trivial. No evidence of tricuspid stenosis. Aortic Valve: The aortic valve is tricuspid. Aortic valve regurgitation is not visualized. Aortic valve sclerosis/calcification is present, without any evidence of aortic stenosis. Aortic valve peak gradient measures 5.3 mmHg. Pulmonic Valve: The pulmonic valve was normal in structure. Pulmonic valve regurgitation is not visualized. No evidence of pulmonic stenosis. Aorta: The aortic root is normal in size and structure. Venous: The inferior vena cava is normal in size with greater than 50% respiratory variability, suggesting right atrial pressure of 3 mmHg. IAS/Shunts: No atrial level shunt detected by color flow Doppler.  LEFT VENTRICLE PLAX 2D LVIDd:         4.70 cm     Diastology LVIDs:         2.90 cm     LV e' medial:    5.33 cm/s LV PW:         1.10 cm     LV E/e' medial:  9.1 LV IVS:        0.90 cm     LV e' lateral:   6.85 cm/s LVOT diam:     2.00 cm     LV E/e' lateral: 7.1 LV SV:         46 LV SV Index:   20 LVOT Area:     3.14 cm  LV Volumes (MOD) LV vol d, MOD A2C: 79.0 ml LV vol d, MOD A4C: 88.8 ml LV vol s, MOD A2C: 31.8 ml LV vol s, MOD A4C: 38.7 ml LV SV MOD A2C:     47.2  ml LV SV MOD A4C:     88.8 ml LV SV MOD BP:      49.1 ml RIGHT VENTRICLE             IVC RV Basal diam:  1.90 cm     IVC diam: 1.30 cm RV Mid diam:    1.70 cm RV S prime:     10.10 cm/s TAPSE (M-mode): 1.6 cm LEFT ATRIUM             Index        RIGHT ATRIUM           Index LA diam:        3.90 cm 1.68 cm/m   RA Area:     10.70 cm LA Vol (A2C):   35.8 ml 15.44 ml/m  RA Volume:   19.00 ml  8.20 ml/m LA Vol (A4C):   31.7 ml 13.67 ml/m LA Biplane Vol: 34.5 ml 14.88 ml/m  AORTIC VALVE AV Area (Vmax): 2.15 cm AV Vmax:         115.00 cm/s AV Peak Grad:   5.3 mmHg LVOT Vmax:      78.80 cm/s LVOT Vmean:     58.600 cm/s LVOT VTI:       0.145 m  AORTA Ao Root diam: 3.40 cm Ao Asc diam:  3.20 cm MITRAL VALVE MV Area (PHT): 3.89 cm    SHUNTS MV Decel Time: 195 msec    Systemic VTI:  0.14 m MV E velocity: 48.40 cm/s  Systemic Diam: 2.00 cm MV A velocity: 65.70 cm/s MV E/A ratio:  0.74 Kirk Ruths MD Electronically signed by Kirk Ruths MD Signature Date/Time: 12/28/2021/2:44:36 PM    Final    MR ANKLE LEFT WO CONTRAST  Result Date: 12/28/2021 CLINICAL DATA:  Septic arthritis suspected, ankle, xray done EXAM: MRI OF THE LEFT ANKLE WITHOUT CONTRAST TECHNIQUE: Multiplanar, multisequence MR imaging of the ankle was performed. No intravenous contrast was administered. COMPARISON:  X-ray 12/27/2021 FINDINGS: Technical Note: Despite efforts by the technologist and patient, motion artifact is present on today's exam and could not be eliminated. This reduces exam sensitivity and specificity. Bones/Joint/Cartilage No acute fracture. No dislocation. No bone marrow edema or marrow replacement. Small bone infarct within the distal tibial metaphysis. No suspicious bone lesion. Mild-to-moderate degenerative changes of the midfoot. Small posterior subtalar joint effusion. Trace calcaneocuboid joint effusion. Trace joint fluid across the tarsometatarsal joints. No large joint effusion. No tibiotalar joint effusion. Ligaments Ankle ligaments are grossly intact, evaluation degraded by motion. Intact Lisfranc ligament. Muscles and Tendons Diffuse edema-like signal throughout the foot musculature likely related to diabetic myopathy/denervation. Trace tenosynovitis of the tibialis posterior tendon, nonspecific. Tendinous structures about the ankle are intact. Soft tissues No soft tissue wound or ulceration.  No organized fluid collection. IMPRESSION: 1. Motion degraded exam. 2. No acute osseous abnormality or evidence of osteomyelitis. 3. Mild-to-moderate  degenerative changes of the midfoot. 4. Scattered small joint effusions in the hindfoot and midfoot, likely degenerative/reactive. No sizable joint effusion to suggest septic arthritis. 5. Trace tenosynovitis of the tibialis posterior tendon, nonspecific. Electronically Signed   By: Davina Poke D.O.   On: 12/28/2021 14:02   DG Foot 2 Views Left  Result Date: 12/27/2021 CLINICAL DATA:  Postop check. EXAM: LEFT FOOT - 2 VIEW COMPARISON:  12/25/2021. FINDINGS: Since the previous exam, the distal phalanx of the great toe has been resected along with a portion of the distal aspect of the proximal phalanx. Resected bone margin  is sharp. No areas of bone resorption is seen to suggest additional regions of osteomyelitis. Soft tissue swelling overlies the resected great toe consistent with postoperative edema. No soft tissue air. IMPRESSION: 1. Resection of the great toe distal phalanx and a portion of the distal aspect of the proximal phalanx. No evidence of an operative complication. 2. No current findings of osteomyelitis. Electronically Signed   By: Lajean Manes M.D.   On: 12/27/2021 15:15        Scheduled Meds:  (feeding supplement) PROSource Plus  30 mL Oral BID   amLODipine  5 mg Oral Daily   atorvastatin  80 mg Oral QHS   carvedilol  12.5 mg Oral BID WC   Chlorhexidine Gluconate Cloth  6 each Topical Q0600   clonazePAM  0.5 mg Oral QHS   clopidogrel  75 mg Oral Daily   DULoxetine  60 mg Oral QHS   gabapentin  300 mg Oral BID   heparin injection (subcutaneous)  5,000 Units Subcutaneous Q8H   insulin aspart  0-15 Units Subcutaneous TID WC   insulin aspart  0-5 Units Subcutaneous QHS   isosorbide mononitrate  60 mg Oral Daily   multivitamin with minerals  1 tablet Oral Daily   mupirocin ointment  1 Application Nasal BID   pantoprazole  40 mg Oral Daily   Continuous Infusions:  sodium chloride 20 mL/hr at 12/25/21 1907    ceFAZolin (ANCEF) IV 2 g (12/29/21 1224)     LOS: 4 days     Time spent: 45 minutes spent on chart review, discussion with nursing staff, consultants, updating family and interview/physical exam; more than 50% of that time was spent in counseling and/or coordination of care.    Geradine Girt, DO [facility] Available via Epic secure chat 7am-7pm After these hours, please refer to coverage provider listed on [email or web address] 12/29/2021, 1:24 PM

## 2021-12-29 NOTE — Progress Notes (Signed)
[name] have been completed. Preliminary results can be found in CV Proc through chart review.   12/29/21 12:19 PM James [name] RVT

## 2021-12-29 NOTE — Progress Notes (Signed)
PODIATRY PROGRESS NOTE  NAME James [name] MRN [id number] DOB Dec 13, 1955 DOA 12/25/2021   CC: s/p partial toe amputation LT great toe Chief Complaint  Patient presents with   Toe Pain   HPI: 66 y.o. male PMHx well-controlled DM type II S/P partial toe amputation LT hallux.  DOS: 12/27/2021.  Patient feeling well.  Pain improved.  Patient currently pending ABIs and TEE, found to have bacteremia.  Patient resting comfortably states the pain is well controlled.  Dressings intact  Past Medical History:  Diagnosis Date   Abnormal cardiac CT angiography    Acid reflux    Annual physical exam 04/08/2015   Arthritis    Atypical chest pain 06/13/2020   Blood transfusion without reported diagnosis    Body mass index (BMI) 35.0-35.9, adult 04/05/2019   Chronic fatigue 01/28/2015   Chronic headaches    on cymbalta   Chronic migraine w/o aura, not intractable, w/o stat migr 10/24/2018   Cirrhosis ([place])    Colon polyps    Complication of anesthesia    problems waking up from anesthesia   Coronary artery disease 01/23/2019   Depression    on cymbalta   Diabetes mellitus with neuropathy ([place])    Diabetes with neuropathy 04/25/2013   Diverticulitis 03/2013   Dyslipidemia 05/29/2019   Eczema    Elevated LFTs    Epidural lipomatosis 10/05/2018   Essential (primary) hypertension 04/05/2019   Essential hypertension 10/10/2019   Fatty liver    GERD (gastroesophageal reflux disease) 04/28/2011   H/O craniotomy 05/07/2015   Headache 04/28/2011   Hepatitis 10/2017   NASH cirrhosis   History of kidney stones    Hyperlipidemia    Hypersomnia with sleep apnea 01/28/2015   Hypertension    IDA (iron deficiency anemia) 01/24/2019   Idiopathic intracranial hypertension 01/14/2017   Insomnia 04/26/2013   Kidney stone    Liver cirrhosis secondary to NASH (nonalcoholic steatohepatitis) ([place]) 01/02/2016   Low back pain 04/05/2019   Lower back injury 08/14/2019   Morbid obesity ([place])    Neuromuscular disorder  ([place])    neuropathy   Neuropathy    Nonalcoholic steatohepatitis 07/05/107   Obstructive hydrocephalus ([place]) 01/28/2015   OSA -- dx ~ 2012, cpap intolerant 09/04/2014    dx ~ 2012, cpap intolerant    PCP NOTES >>> 04/08/2015   Post-op pain 03/19/2019   Post-traumatic hydrocephalus (HCC)    s/p shunts x 2 (first got infected )   Presence of cerebrospinal fluid drainage device 07/28/2011   Psoriasis    sees Dr [name]   Psoriatic arthritis ([place])    REM behavioral disorder 01/14/2017   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   see's Dr.Ortmann   Severe obesity (BMI >= 40) ([place]) 01/28/2015   SI (sacroiliac) joint dysfunction 08/14/2019   Sigmoid diverticulitis 04/25/2013   Sleep apnea    no CPAP      Spondylolisthesis, lumbar region 03/16/2019   Stomach ulcer    Testosterone deficiency 04/28/2011   VP (ventriculoperitoneal) shunt status 07/31/2020       Latest Ref Rng & Units 12/28/2021   12:41 AM 12/27/2021    1:09 AM 12/26/2021    4:50 AM  CBC  WBC 4.0 - 10.5 K/uL 6.2  8.3  8.7   Hemoglobin 13.0 - 17.0 g/dL 12.6  12.3  13.5   Hematocrit 39.0 - 52.0 % 37.0  36.2  40.7   Platelets 150 - 400 K/uL 116  125  118  Latest Ref Rng & Units 12/28/2021   12:41 AM 12/27/2021    1:09 AM 12/26/2021    4:50 AM  BMP  Glucose 70 - 99 mg/dL 120  125  127   BUN 8 - 23 mg/dL 18  17  18    Creatinine 0.61 - 1.24 mg/dL 1.53  1.56  1.60   Sodium 135 - 145 mmol/L 140  137  137   Potassium 3.5 - 5.1 mmol/L 4.2  4.2  3.9   Chloride 98 - 111 mmol/L 107  108  106   CO2 22 - 32 mmol/L 24  22  23    Calcium 8.9 - 10.3 mg/dL 9.4  9.5  9.6        Physical Exam: General: The patient is alert and oriented x3 in no acute distress.   Dermatology: Skin incision well coapted.  No active bleeding or drainage.  Sutures intact.  Vascular: Palpable pedal pulses bilaterally.  There continues to be some moderate edema with resolving erythema especially localized around the base of the great  toe.  Neurological: Epicritic and protective threshold diminished  Musculoskeletal Exam: S/P partial LT hallux amputation    ASSESSMENT/PLAN OF CARE POV LT hallux amputation.  DOS: 12/27/2021 -Dressings changed.  Wound care orders placed for dressing changes every other day -Continue ABX as per ID.  Discharge ABX as per ID -TEE pending for Thursday -ABIs also pending -From a surgical/podiatry standpoint, the wound is stable.  Continue dressing changes every other day.   -Weightbearing as tolerated in a postsurgical shoe -Podiatry to sign off.  Follow-up within 1 week postdischarge in office    Edrick Kins, DPM [facility]  Dr. Edrick Kins, DPM    [address], [place] [address]                Office [phone]  Fax [phone]

## 2021-12-29 NOTE — Plan of Care (Signed)

## 2021-12-29 NOTE — Progress Notes (Signed)
Mobility Specialist Progress Note   12/29/21 1105  Mobility  Activity Ambulated independently in hallway  Level of Assistance Modified independent, requires aide device or extra time  Assistive Device None  LLE Weight Bearing WBAT  Distance Ambulated (ft) 226 ft  Activity Response Tolerated well  $Mobility charge 1 Mobility   Received pt in bed having no complaints and agreeable to mobility. Pt was asymptomatic throughout ambulation and returned to room w/o fault. Left in bed w/ call bell in reach and all needs met.expressed pain in foot mobility was a 2.  Holland Falling Mobility Specialist [place] #:  [phone] Acute Rehab Office:  [phone]

## 2021-12-29 NOTE — H&P (View-Only) (Signed)
[facility] for Infectious Disease  Date of Admission:  12/25/2021           Reason for visit: Follow up on MSSA bacteremia  Current antibiotics: Cefazolin   ASSESSMENT:    66 y.o. male admitted with:  MSSA bacteremia: Due to osteomyelitis of left great toe s/p left great toe amputation 7/1 by podiatry.  6/29 blood cultures positive and repeat cultures 7/1 NGTD. OR cultures also growing Staph aureus.   TTE did not show obvious vegetation.  Fortunately, MRI ankle did not reveal evidence of more proximal infection. Acute kidney injury: Improved. Type 2 DM: A1c in May was 5.9. Sepsis: Due to #1 and improved. RA: on Humira. History of lumbar PLIF with instrumentation in Sept 2020. History of post-traumatic hydrocephalus s/p VP shunt in ~2007. History of left hip replacement about 15 years ago. Cirrhosis: Well  compensated.  RECOMMENDATIONS:    Continue cefazolin Follow repeat blood cultures I would recommend a TEE in this setting Follow up ABI Lab monitoring Wound care Glycemic control Will follow   Principal Problem:   MSSA bacteremia Active Problems:   Diabetes with neuropathy   Coronary artery disease   Cirrhosis (HCC)   Hypertension   Osteomyelitis of great toe of left foot (HCC)   Acute renal failure superimposed on stage 3a chronic kidney disease (HCC)    MEDICATIONS:    Scheduled Meds:  (feeding supplement) PROSource Plus  30 mL Oral BID   amLODipine  5 mg Oral Daily   atorvastatin  80 mg Oral QHS   carvedilol  12.5 mg Oral BID WC   Chlorhexidine Gluconate Cloth  6 each Topical Q0600   clonazePAM  0.5 mg Oral QHS   clopidogrel  75 mg Oral Daily   DULoxetine  60 mg Oral QHS   gabapentin  300 mg Oral BID   heparin injection (subcutaneous)  5,000 Units Subcutaneous Q8H   insulin aspart  0-15 Units Subcutaneous TID WC   insulin aspart  0-5 Units Subcutaneous QHS   isosorbide mononitrate  60 mg Oral Daily   multivitamin with minerals  1 tablet  Oral Daily   mupirocin ointment  1 Application Nasal BID   pantoprazole  40 mg Oral Daily   Continuous Infusions:  sodium chloride 20 mL/hr at 12/25/21 1907    ceFAZolin (ANCEF) IV 2 g (12/29/21 0509)   PRN Meds:.sodium chloride, acetaminophen **OR** acetaminophen, HYDROmorphone (DILAUDID) injection  SUBJECTIVE:   24 hour events:  No major events noted  Patient reports feeling better this morning.  Trying to get himself to wake up.  He reports some improvement in his left ankle discomfort.  He was reassured that his MRI was concerning for more proximal infection.  We discussed the need for TEE and he is agreeable.  Review of Systems  All other systems reviewed and are negative.     OBJECTIVE:   Blood pressure 109/67, pulse 75, temperature 98.3 F (36.8 C), resp. rate 18, height 5' 10.98" (1.803 m), weight 113.4 kg, SpO2 95 %. Body mass index is 34.88 kg/m.  Physical Exam Constitutional:      General: He is not in acute distress.    Appearance: Normal appearance.  HENT:     Head: Normocephalic and atraumatic.  Eyes:     Extraocular Movements: Extraocular movements intact.     Conjunctiva/sclera: Conjunctivae normal.  Pulmonary:     Effort: Pulmonary effort is normal. No respiratory distress.  Abdominal:     General:  There is no distension.     Palpations: Abdomen is soft.     Tenderness: There is no abdominal tenderness.  Musculoskeletal:     Cervical back: Normal range of motion and neck supple.     Comments: Status post left great toe amputation.  Foot is otherwise wrapped in Ace bandage.  He is able to wiggle his toes.  Skin:    General: Skin is warm and dry.  Neurological:     General: No focal deficit present.     Mental Status: He is alert and oriented to person, place, and time.  Psychiatric:        Mood and Affect: Mood normal.        Behavior: Behavior normal.      Lab Results: Lab Results  Component Value Date   WBC 6.2 12/28/2021   HGB 12.6 (L)  12/28/2021   HCT 37.0 (L) 12/28/2021   MCV 89.2 12/28/2021   PLT 116 (L) 12/28/2021    Lab Results  Component Value Date   NA 140 12/28/2021   K 4.2 12/28/2021   CO2 24 12/28/2021   GLUCOSE 120 (H) 12/28/2021   BUN 18 12/28/2021   CREATININE 1.53 (H) 12/28/2021   CALCIUM 9.4 12/28/2021   GFRNONAA 51 (L) 12/28/2021   GFRAA 66 08/08/2020    Lab Results  Component Value Date   ALT 18 10/24/2021   AST 15 10/24/2021   ALKPHOS 78 10/24/2021   BILITOT 1.8 (H) 10/24/2021       Component Value Date/Time   CRP 10.1 (H) 12/27/2021 0109       Component Value Date/Time   ESRSEDRATE 31 (H) 12/27/2021 0109     I have reviewed the micro and lab results in Epic.  Imaging: ECHOCARDIOGRAM COMPLETE  Result Date: 12/28/2021    ECHOCARDIOGRAM REPORT   Patient Name:   James [name] Date of Exam: 12/28/2021 Medical Rec #:  [id number]       Height:       71.0 in Accession #:    [id number]      Weight:       250.0 lb Date of Birth:  06-01-1956        BSA:          2.318 m Patient Age:    35 years        BP:           143/82 mmHg Patient Gender: M               HR:           72 bpm. Exam Location:  Inpatient Procedure: 2D Echo, Cardiac Doppler and Color Doppler Indications:    Bacteremia  History:        Patient has prior history of Echocardiogram examinations. CAD;                 Risk Factors:Hypertension, Diabetes and Sleep Apnea.  Sonographer:    Jyl Heinz Referring Phys: [id number] [place]  1. Left ventricular ejection fraction, by estimation, is 60 to 65%. The left ventricle has normal function. The left ventricle has no regional wall motion abnormalities. Left ventricular diastolic parameters are consistent with Grade I diastolic dysfunction (impaired relaxation).  2. Right ventricular systolic function is normal. The right ventricular size is normal.  3. The mitral valve is normal in structure. Trivial mitral valve regurgitation. No evidence of mitral stenosis.  4. The aortic  valve is tricuspid. Aortic valve  regurgitation is not visualized. Aortic valve sclerosis/calcification is present, without any evidence of aortic stenosis.  5. The inferior vena cava is normal in size with greater than 50% respiratory variability, suggesting right atrial pressure of 3 mmHg. FINDINGS  Left Ventricle: Left ventricular ejection fraction, by estimation, is 60 to 65%. The left ventricle has normal function. The left ventricle has no regional wall motion abnormalities. The left ventricular internal cavity size was normal in size. There is  no left ventricular hypertrophy. Left ventricular diastolic parameters are consistent with Grade I diastolic dysfunction (impaired relaxation). Right Ventricle: The right ventricular size is normal. Right ventricular systolic function is normal. Left Atrium: Left atrial size was normal in size. Right Atrium: Right atrial size was normal in size. Pericardium: There is no evidence of pericardial effusion. Mitral Valve: The mitral valve is normal in structure. There is mild thickening of the mitral valve leaflet(s). Trivial mitral valve regurgitation. No evidence of mitral valve stenosis. Tricuspid Valve: The tricuspid valve is normal in structure. Tricuspid valve regurgitation is trivial. No evidence of tricuspid stenosis. Aortic Valve: The aortic valve is tricuspid. Aortic valve regurgitation is not visualized. Aortic valve sclerosis/calcification is present, without any evidence of aortic stenosis. Aortic valve peak gradient measures 5.3 mmHg. Pulmonic Valve: The pulmonic valve was normal in structure. Pulmonic valve regurgitation is not visualized. No evidence of pulmonic stenosis. Aorta: The aortic root is normal in size and structure. Venous: The inferior vena cava is normal in size with greater than 50% respiratory variability, suggesting right atrial pressure of 3 mmHg. IAS/Shunts: No atrial level shunt detected by color flow Doppler.  LEFT VENTRICLE PLAX 2D LVIDd:          4.70 cm     Diastology LVIDs:         2.90 cm     LV e' medial:    5.33 cm/s LV PW:         1.10 cm     LV E/e' medial:  9.1 LV IVS:        0.90 cm     LV e' lateral:   6.85 cm/s LVOT diam:     2.00 cm     LV E/e' lateral: 7.1 LV SV:         46 LV SV Index:   20 LVOT Area:     3.14 cm  LV Volumes (MOD) LV vol d, MOD A2C: 79.0 ml LV vol d, MOD A4C: 88.8 ml LV vol s, MOD A2C: 31.8 ml LV vol s, MOD A4C: 38.7 ml LV SV MOD A2C:     47.2 ml LV SV MOD A4C:     88.8 ml LV SV MOD BP:      49.1 ml RIGHT VENTRICLE             IVC RV Basal diam:  1.90 cm     IVC diam: 1.30 cm RV Mid diam:    1.70 cm RV S prime:     10.10 cm/s TAPSE (M-mode): 1.6 cm LEFT ATRIUM             Index        RIGHT ATRIUM           Index LA diam:        3.90 cm 1.68 cm/m   RA Area:     10.70 cm LA Vol (A2C):   35.8 ml 15.44 ml/m  RA Volume:   19.00 ml  8.20 ml/m LA Vol (A4C):  31.7 ml 13.67 ml/m LA Biplane Vol: 34.5 ml 14.88 ml/m  AORTIC VALVE AV Area (Vmax): 2.15 cm AV Vmax:        115.00 cm/s AV Peak Grad:   5.3 mmHg LVOT Vmax:      78.80 cm/s LVOT Vmean:     58.600 cm/s LVOT VTI:       0.145 m  AORTA Ao Root diam: 3.40 cm Ao Asc diam:  3.20 cm MITRAL VALVE MV Area (PHT): 3.89 cm    SHUNTS MV Decel Time: 195 msec    Systemic VTI:  0.14 m MV E velocity: 48.40 cm/s  Systemic Diam: 2.00 cm MV A velocity: 65.70 cm/s MV E/A ratio:  0.74 Kirk Ruths MD Electronically signed by Kirk Ruths MD Signature Date/Time: 12/28/2021/2:44:36 PM    Final    MR ANKLE LEFT WO CONTRAST  Result Date: 12/28/2021 CLINICAL DATA:  Septic arthritis suspected, ankle, xray done EXAM: MRI OF THE LEFT ANKLE WITHOUT CONTRAST TECHNIQUE: Multiplanar, multisequence MR imaging of the ankle was performed. No intravenous contrast was administered. COMPARISON:  X-ray 12/27/2021 FINDINGS: Technical Note: Despite efforts by the technologist and patient, motion artifact is present on today's exam and could not be eliminated. This reduces exam sensitivity and  specificity. Bones/Joint/Cartilage No acute fracture. No dislocation. No bone marrow edema or marrow replacement. Small bone infarct within the distal tibial metaphysis. No suspicious bone lesion. Mild-to-moderate degenerative changes of the midfoot. Small posterior subtalar joint effusion. Trace calcaneocuboid joint effusion. Trace joint fluid across the tarsometatarsal joints. No large joint effusion. No tibiotalar joint effusion. Ligaments Ankle ligaments are grossly intact, evaluation degraded by motion. Intact Lisfranc ligament. Muscles and Tendons Diffuse edema-like signal throughout the foot musculature likely related to diabetic myopathy/denervation. Trace tenosynovitis of the tibialis posterior tendon, nonspecific. Tendinous structures about the ankle are intact. Soft tissues No soft tissue wound or ulceration.  No organized fluid collection. IMPRESSION: 1. Motion degraded exam. 2. No acute osseous abnormality or evidence of osteomyelitis. 3. Mild-to-moderate degenerative changes of the midfoot. 4. Scattered small joint effusions in the hindfoot and midfoot, likely degenerative/reactive. No sizable joint effusion to suggest septic arthritis. 5. Trace tenosynovitis of the tibialis posterior tendon, nonspecific. Electronically Signed   By: Davina Poke D.O.   On: 12/28/2021 14:02   DG Foot 2 Views Left  Result Date: 12/27/2021 CLINICAL DATA:  Postop check. EXAM: LEFT FOOT - 2 VIEW COMPARISON:  12/25/2021. FINDINGS: Since the previous exam, the distal phalanx of the great toe has been resected along with a portion of the distal aspect of the proximal phalanx. Resected bone margin is sharp. No areas of bone resorption is seen to suggest additional regions of osteomyelitis. Soft tissue swelling overlies the resected great toe consistent with postoperative edema. No soft tissue air. IMPRESSION: 1. Resection of the great toe distal phalanx and a portion of the distal aspect of the proximal phalanx. No  evidence of an operative complication. 2. No current findings of osteomyelitis. Electronically Signed   By: Lajean Manes M.D.   On: 12/27/2021 15:15     Imaging independently reviewed in Epic.    [facility] for Infectious Disease [facility] [phone] pager 12/29/2021, 6:29 AM

## 2021-12-30 DIAGNOSIS — R7881 Bacteremia: Secondary | ICD-10-CM | POA: Diagnosis not present

## 2021-12-30 DIAGNOSIS — B9561 Methicillin susceptible Staphylococcus aureus infection as the cause of diseases classified elsewhere: Secondary | ICD-10-CM | POA: Diagnosis not present

## 2021-12-30 LAB — GLUCOSE, CAPILLARY
Glucose-Capillary: 115 mg/dL — ABNORMAL HIGH (ref 70–99)
Glucose-Capillary: 124 mg/dL — ABNORMAL HIGH (ref 70–99)
Glucose-Capillary: 126 mg/dL — ABNORMAL HIGH (ref 70–99)
Glucose-Capillary: 129 mg/dL — ABNORMAL HIGH (ref 70–99)
Glucose-Capillary: 129 mg/dL — ABNORMAL HIGH (ref 70–99)
Glucose-Capillary: 144 mg/dL — ABNORMAL HIGH (ref 70–99)
Glucose-Capillary: 153 mg/dL — ABNORMAL HIGH (ref 70–99)

## 2021-12-30 NOTE — Plan of Care (Signed)
VSS. OOB, ambulating in halls. Mild pain. Encouraged leg elevation. Drsg intact- due to be changed 7/5. Call bell within reach.   Problem: Clinical Measurements: Goal: Ability to maintain clinical measurements within normal limits will improve Outcome: Progressing Goal: Will remain free from infection Outcome: Progressing Goal: Diagnostic test results will improve Outcome: Progressing Goal: Respiratory complications will improve Outcome: Progressing Goal: Cardiovascular complication will be avoided Outcome: Progressing   Problem: Elimination: Goal: Will not experience complications related to bowel motility Outcome: Progressing Goal: Will not experience complications related to urinary retention Outcome: Progressing   Problem: Pain Managment: Goal: General experience of comfort will improve Outcome: Progressing   Problem: Safety: Goal: Ability to remain free from injury will improve Outcome: Progressing   Problem: Skin Integrity: Goal: Risk for impaired skin integrity will decrease Outcome: Progressing

## 2021-12-30 NOTE — Plan of Care (Signed)
  Problem: Education: Goal: Knowledge of General Education information will improve Description Including pain rating scale, medication(s)/side effects and non-pharmacologic comfort measures Outcome: Progressing   

## 2021-12-30 NOTE — Progress Notes (Signed)
Mobility Specialist Progress Note   12/30/21 1000  Mobility  Activity Ambulated independently in hallway  Level of Assistance Independent after set-up  Assistive Device None  LLE Weight Bearing WBAT  Distance Ambulated (ft) 550 ft  Activity Response Tolerated well  $Mobility charge 1 Mobility   Received pt in bed having no complaints and agreeable to mobility. Pt was asymptomatic throughout ambulation and returned to room w/o fault. Left in bed w/ call bell in reach and all needs met.  Holland Falling Mobility Specialist MS [facility] #:  [phone] Acute Rehab Office:  [phone]

## 2021-12-30 NOTE — Progress Notes (Addendum)
PROGRESS NOTE    RYKKER COVIELLO  DVV:[id number] DOB: 1955-09-15 DOA: 12/25/2021 PCP: Colon Branch, MD    Brief Narrative:   JASTEN GUYETTE is a pleasant 66 y.o. male with medical history significant for coronary artery disease, cirrhosis, hypertension, OSA with CPAP intolerance, well-controlled type 2 diabetes mellitus, and CKD 3B, now presenting to the emergency department with pain, redness, and swelling involving the left first toe.  Patient reports that he had an ingrown toenail that he removed himself 2 weeks ago, and then noted this morning that the toe was red, sore, and swollen. Found to have bacteremia and osteomyelitis.  TEE pending for Thursday.  Assessment and Plan:  Left 1st toe osteomyelitis  - Presents with left great toe pain, swelling, and redness after removing his ingrown nail 2 weeks ago  - Podiatry was consulted: s/p AMPUTATION GREAT TOE (Left) - Continue antibiotics -MRI ankle w/ infection -TTE unrevealing-- TEE pending for Thursday -ABI unremarkable    MSSA bacteremia -IV abx -TEE pending -id consult appreciated    AKI on CKD IIIa - improved with IVF   CAD  - Stent placed in April 2022  - No anginal complaints  - Continue Lipitor and resume plavix   Hypertension  - Continue Norvasc and Coreg     type II DM  - A1c was 5.9% in May 2023     Cirrhosis  - Appears compensated    Obesity Estimated body mass index is 34.88 kg/m as calculated from the following:   Height as of this encounter: 5' 10.98" (1.803 m).   Weight as of this encounter: 113.4 kg.    DVT prophylaxis: heparin injection 5,000 Units Start: 12/26/21 2200 SCDs Start: 12/26/21 0433    Code Status: Full Code   Disposition Plan:  Level of care: Med-Surg Status is: Inpatient Remains inpatient appropriate because: needs TEE    Consultants:  Podiatry ID   Subjective: Bored and tired  Objective: Vitals:   12/29/21 2042 12/29/21 2200 12/30/21 0200 12/30/21 0844  BP: (!)  94/57 (!) 94/57  132/82  Pulse: 82 82  72  Resp: 18 18  18   Temp: 98.8 F (37.1 C) 98.8 F (37.1 C)  98.2 F (36.8 C)  TempSrc: Oral Oral  Oral  SpO2: 94% 94% 94% 96%  Weight:      Height:       No intake or output data in the 24 hours ending 12/30/21 1318  Filed Weights   12/25/21 1648 12/27/21 1223  Weight: 113.4 kg 113.4 kg    Examination:   General: Appearance:    Obese male in no acute distress     Lungs:     respirations unlabored  Heart:    Normal heart rate.      Neurologic:   Awake, alert, oriented x 3. No apparent focal neurological           defect.         Data Reviewed: I have personally reviewed following labs and imaging studies  CBC: Recent Labs  Lab 12/25/21 1734 12/26/21 0450 12/27/21 0109 12/28/21 0041  WBC 10.0 8.7 8.3 6.2  NEUTROABS 7.4  --   --   --   HGB 14.2 13.5 12.3* 12.6*  HCT 41.3 40.7 36.2* 37.0*  MCV 86.9 87.7 87.9 89.2  PLT 159 118* 125* 626*   Basic Metabolic Panel: Recent Labs  Lab 12/25/21 1734 12/26/21 0450 12/27/21 0109 12/28/21 0041  NA 135 137 137 140  K  4.4 3.9 4.2 4.2  CL 105 106 108 107  CO2 23 23 22 24   GLUCOSE 176* 127* 125* 120*  BUN 25* 18 17 18   CREATININE 1.80* 1.60* 1.56* 1.53*  CALCIUM 9.7 9.6 9.5 9.4   GFR: Estimated Creatinine Clearance: 63.3 mL/min (A) (by C-G formula based on SCr of 1.53 mg/dL (H)). Liver Function Tests: No results for input(s): "AST", "ALT", "ALKPHOS", "BILITOT", "PROT", "ALBUMIN" in the last 168 hours. No results for input(s): "LIPASE", "AMYLASE" in the last 168 hours. No results for input(s): "AMMONIA" in the last 168 hours. Coagulation Profile: No results for input(s): "INR", "PROTIME" in the last 168 hours. Cardiac Enzymes: No results for input(s): "CKTOTAL", "CKMB", "CKMBINDEX", "TROPONINI" in the last 168 hours. BNP (last 3 results) No results for input(s): "PROBNP" in the last 8760 hours. HbA1C: No results for input(s): "HGBA1C" in the last 72 hours. CBG: Recent  Labs  Lab 12/29/21 2026 12/30/21 0022 12/30/21 0514 12/30/21 0803 12/30/21 1104  GLUCAP 147* 126* 129* 129* 153*   Lipid Profile: No results for input(s): "CHOL", "HDL", "LDLCALC", "TRIG", "CHOLHDL", "LDLDIRECT" in the last 72 hours. Thyroid Function Tests: No results for input(s): "TSH", "T4TOTAL", "FREET4", "T3FREE", "THYROIDAB" in the last 72 hours. Anemia Panel: No results for input(s): "VITAMINB12", "FOLATE", "FERRITIN", "TIBC", "IRON", "RETICCTPCT" in the last 72 hours. Sepsis Labs: Recent Labs  Lab 12/25/21 1743  LATICACIDVEN 2.0*    Recent Results (from the past 240 hour(s))  Blood Cultures x 2 sites     Status: Abnormal   Collection Time: 12/25/21  5:48 PM   Specimen: BLOOD  Result Value Ref Range Status   Specimen Description   Final    BLOOD RIGHT ANTECUBITAL Performed at [facility], [address] [place], [place] [address]    Special Requests   Final    BOTTLES DRAWN AEROBIC AND ANAEROBIC Blood Culture adequate volume Performed at [facility], [address] [place], [place] [address]    Culture  Setup Time   Final    GRAM POSITIVE COCCI IN CLUSTERS CRITICAL RESULT CALLED TO, READ BACK BY AND VERIFIED WITH: PHARMD A. Caffie Pinto @1750  FH IN BOTH AEROBIC AND ANAEROBIC BOTTLES Performed at [facility], [place] [address], [place],  [address]    Culture STAPHYLOCOCCUS AUREUS (A)  Final   Report Status 12/28/2021 FINAL  Final   Organism ID, Bacteria STAPHYLOCOCCUS AUREUS  Final      Susceptibility   Staphylococcus aureus - MIC*    CIPROFLOXACIN <=0.5 SENSITIVE Sensitive     ERYTHROMYCIN <=0.25 SENSITIVE Sensitive     GENTAMICIN <=0.5 SENSITIVE Sensitive     OXACILLIN 0.5 SENSITIVE Sensitive     TETRACYCLINE <=1 SENSITIVE Sensitive     VANCOMYCIN <=0.5 SENSITIVE Sensitive     TRIMETH/SULFA <=10 SENSITIVE Sensitive     CLINDAMYCIN <=0.25 SENSITIVE Sensitive     RIFAMPIN <=0.5 SENSITIVE Sensitive     Inducible  Clindamycin NEGATIVE Sensitive     * STAPHYLOCOCCUS AUREUS  Blood Culture ID Panel (Reflexed)     Status: Abnormal   Collection Time: 12/25/21  5:48 PM  Result Value Ref Range Status   Enterococcus faecalis NOT DETECTED NOT DETECTED Final   Enterococcus Faecium NOT DETECTED NOT DETECTED Final   Listeria monocytogenes NOT DETECTED NOT DETECTED Final   Staphylococcus species DETECTED (A) NOT DETECTED Final    Comment: CRITICAL RESULT CALLED TO, READ BACK BY AND VERIFIED WITH: PHARMD A. IONGE [id number] @1750  FH  Staphylococcus aureus (BCID) DETECTED (A) NOT DETECTED Final    Comment: CRITICAL RESULT CALLED TO, READ BACK BY AND VERIFIED WITH: PHARMD A. JYNWG [id number] @1750  FH    Staphylococcus epidermidis NOT DETECTED NOT DETECTED Final   Staphylococcus lugdunensis NOT DETECTED NOT DETECTED Final   Streptococcus species NOT DETECTED NOT DETECTED Final   Streptococcus agalactiae NOT DETECTED NOT DETECTED Final   Streptococcus pneumoniae NOT DETECTED NOT DETECTED Final   Streptococcus pyogenes NOT DETECTED NOT DETECTED Final   A.calcoaceticus-baumannii NOT DETECTED NOT DETECTED Final   Bacteroides fragilis NOT DETECTED NOT DETECTED Final   Enterobacterales NOT DETECTED NOT DETECTED Final   Enterobacter cloacae complex NOT DETECTED NOT DETECTED Final   Escherichia coli NOT DETECTED NOT DETECTED Final   Klebsiella aerogenes NOT DETECTED NOT DETECTED Final   Klebsiella oxytoca NOT DETECTED NOT DETECTED Final   Klebsiella pneumoniae NOT DETECTED NOT DETECTED Final   Proteus species NOT DETECTED NOT DETECTED Final   Salmonella species NOT DETECTED NOT DETECTED Final   Serratia marcescens NOT DETECTED NOT DETECTED Final   Haemophilus influenzae NOT DETECTED NOT DETECTED Final   Neisseria meningitidis NOT DETECTED NOT DETECTED Final   Pseudomonas aeruginosa NOT DETECTED NOT DETECTED Final   Stenotrophomonas maltophilia NOT DETECTED NOT DETECTED Final   Candida albicans NOT DETECTED NOT DETECTED  Final   Candida auris NOT DETECTED NOT DETECTED Final   Candida glabrata NOT DETECTED NOT DETECTED Final   Candida krusei NOT DETECTED NOT DETECTED Final   Candida parapsilosis NOT DETECTED NOT DETECTED Final   Candida tropicalis NOT DETECTED NOT DETECTED Final   Cryptococcus neoformans/gattii NOT DETECTED NOT DETECTED Final   Meth resistant mecA/C and MREJ NOT DETECTED NOT DETECTED Final    Comment: Performed at [facility], [address] [place], [place] [address]  Blood Cultures x 2 sites     Status: Abnormal   Collection Time: 12/25/21  6:21 PM   Specimen: BLOOD RIGHT HAND  Result Value Ref Range Status   Specimen Description   Final    BLOOD RIGHT HAND Performed at [facility], [address] [place], [place] [address]    Special Requests   Final    BOTTLES DRAWN AEROBIC AND ANAEROBIC Blood Culture adequate volume Performed at [facility], [address] [place], [place] [address]    Culture  Setup Time   Final    GRAM POSITIVE COCCI IN CLUSTERS AEROBIC BOTTLE ONLY CRITICAL VALUE NOTED.  VALUE IS CONSISTENT WITH PREVIOUSLY REPORTED AND CALLED VALUE. CRITICAL RESULT CALLED TO, READ BACK BY AND VERIFIED WITH: PHARMD A. UXLKG [id number] @1750  FH    Culture (A)  Final    STAPHYLOCOCCUS AUREUS SUSCEPTIBILITIES PERFORMED ON PREVIOUS CULTURE WITHIN THE LAST 5 DAYS. Performed at [facility], [place] [address] [place], [place] [address]    Report Status 12/28/2021 FINAL  Final  Surgical pcr screen     Status: Abnormal   Collection Time: 12/26/21  5:21 AM   Specimen: Nasal Mucosa; Nasal Swab  Result Value Ref Range Status   MRSA, PCR POSITIVE (A) NEGATIVE Final    Comment: RESULT CALLED TO, READ BACK BY AND VERIFIED WITH: RN KAY BROWN 12/26/21@6 :50 BY TW    Staphylococcus aureus POSITIVE (A) NEGATIVE Final    Comment: (NOTE) The Xpert SA Assay (FDA approved for NASAL specimens in patients 4 years of age and older), is one component of a  comprehensive surveillance program. It is not intended to  diagnose infection nor to guide or monitor treatment. Performed at [facility], [place] [address] [place], [place] [address]   Culture, blood (Routine X 2) w Reflex to ID Panel     Status: None (Preliminary result)   Collection Time: 12/27/21  7:15 AM   Specimen: BLOOD RIGHT HAND  Result Value Ref Range Status   Specimen Description BLOOD RIGHT HAND  Final   Special Requests   Final    BOTTLES DRAWN AEROBIC AND ANAEROBIC Blood Culture adequate volume   Culture   Final    NO GROWTH 3 DAYS Performed at [facility], [place] [address], [place], [place] [address]    Report Status PENDING  Incomplete  Culture, blood (Routine X 2) w Reflex to ID Panel     Status: None (Preliminary result)   Collection Time: 12/27/21  7:15 AM   Specimen: BLOOD LEFT HAND  Result Value Ref Range Status   Specimen Description BLOOD LEFT HAND  Final   Special Requests   Final    BOTTLES DRAWN AEROBIC AND ANAEROBIC Blood Culture results may not be optimal due to an excessive volume of blood received in culture bottles   Culture   Final    NO GROWTH 3 DAYS Performed at [facility], [place] [address], [place], [place] [address]    Report Status PENDING  Incomplete  Aerobic/Anaerobic Culture w Gram Stain (surgical/deep wound)     Status: None (Preliminary result)   Collection Time: 12/27/21  1:23 PM   Specimen: Soft Tissue, Other  Result Value Ref Range Status   Specimen Description OTHER  Final   Special Requests NONE  Final   Gram Stain   Final    NO SQUAMOUS EPITHELIAL CELLS SEEN NO WBC SEEN NO ORGANISMS SEEN Performed at [facility], [place] [address], [place], [place] [address]    Culture   Final    RARE STAPHYLOCOCCUS AUREUS SUSCEPTIBILITIES TO FOLLOW RARE GROUP B STREP(S.AGALACTIAE)ISOLATED TESTING AGAINST S. AGALACTIAE NOT ROUTINELY PERFORMED DUE TO PREDICTABILITY OF AMP/PEN/VAN SUSCEPTIBILITY. NO ANAEROBES ISOLATED;  CULTURE IN PROGRESS FOR 5 DAYS    Report Status PENDING  Incomplete         Radiology Studies: VAS [place] ABI WITH/WO TBI  Result Date: 12/29/2021  LOWER EXTREMITY DOPPLER STUDY Patient Name:  WISSAM RESOR  Date of Exam:   12/29/2021 Medical Rec #: [id number]        Accession #:    [id number] Date of Birth: 07-09-1955         Patient Gender: M Patient Age:   64 years Exam Location:  [facility] Procedure:      VAS [place] ABI WITH/WO TBI Referring Phys: Jule Ser --------------------------------------------------------------------------------  Indications: Ulceration. High Risk Factors: Hypertension, Diabetes.  Vascular Interventions: 12/27/2021 - Left AMPUTATION GREAT TOE. Comparison Study: No prior studies. Performing Technologist: Carlos Levering RVT  Examination Guidelines: A complete evaluation includes at minimum, Doppler waveform signals and systolic blood pressure reading at the level of bilateral brachial, anterior tibial, and posterior tibial arteries, when vessel segments are accessible. Bilateral testing is considered an integral part of a complete examination. Photoelectric Plethysmograph (PPG) waveforms and toe systolic pressure readings are included as required and additional duplex testing as needed. Limited examinations for reoccurring indications may be performed as noted.  ABI Findings: +---------+------------------+-----+---------+--------+ Right    Rt Pressure (mmHg)IndexWaveform Comment  +---------+------------------+-----+---------+--------+ Brachial 123  triphasic         +---------+------------------+-----+---------+--------+ PTA      152               1.23 triphasic         +---------+------------------+-----+---------+--------+ DP       15                0.12 triphasic         +---------+------------------+-----+---------+--------+ Great Toe67                0.54                    +---------+------------------+-----+---------+--------+ +---------+------------------+-----+---------+----------+ Left     Lt Pressure (mmHg)IndexWaveform Comment    +---------+------------------+-----+---------+----------+ Brachial 124                    triphasic           +---------+------------------+-----+---------+----------+ PTA      158               1.27 triphasic           +---------+------------------+-----+---------+----------+ DP       158               1.27 triphasic           +---------+------------------+-----+---------+----------+ Great Toe                                Amputation +---------+------------------+-----+---------+----------+ +-------+-----------+-----------+------------+------------+ ABI/TBIToday's ABIToday's TBIPrevious ABIPrevious TBI +-------+-----------+-----------+------------+------------+ Right  1.24       0.54                                +-------+-----------+-----------+------------+------------+ Left   1.27                                           +-------+-----------+-----------+------------+------------+  Summary: Right: Resting right ankle-brachial index is within normal range. No evidence of significant right lower extremity arterial disease. The right toe-brachial index is abnormal. Left: Resting left ankle-brachial index is within normal range. No evidence of significant left lower extremity arterial disease. Unable to obtain TBI due to great toe amputation. *See table(s) above for measurements and observations.  Electronically signed by Deitra Mayo MD on 12/29/2021 at 4:24:16 PM.    Final    ECHOCARDIOGRAM COMPLETE  Result Date: 12/28/2021    ECHOCARDIOGRAM REPORT   Patient Name:   MACKENZIE LIA Charland Date of Exam: 12/28/2021 Medical Rec #:  [id number]       Height:       71.0 in Accession #:    [id number]      Weight:       250.0 lb Date of Birth:  1955-10-26        BSA:          2.318 m Patient Age:    42 years         BP:           143/82 mmHg Patient Gender: M               HR:           72 bpm. Exam Location:  Inpatient Procedure: 2D Echo, Cardiac Doppler and Color Doppler Indications:  Bacteremia  History:        Patient has prior history of Echocardiogram examinations. CAD;                 Risk Factors:Hypertension, Diabetes and Sleep Apnea.  Sonographer:    Jyl Heinz Referring Phys: [id number] Mallory  1. Left ventricular ejection fraction, by estimation, is 60 to 65%. The left ventricle has normal function. The left ventricle has no regional wall motion abnormalities. Left ventricular diastolic parameters are consistent with Grade I diastolic dysfunction (impaired relaxation).  2. Right ventricular systolic function is normal. The right ventricular size is normal.  3. The mitral valve is normal in structure. Trivial mitral valve regurgitation. No evidence of mitral stenosis.  4. The aortic valve is tricuspid. Aortic valve regurgitation is not visualized. Aortic valve sclerosis/calcification is present, without any evidence of aortic stenosis.  5. The inferior vena cava is normal in size with greater than 50% respiratory variability, suggesting right atrial pressure of 3 mmHg. FINDINGS  Left Ventricle: Left ventricular ejection fraction, by estimation, is 60 to 65%. The left ventricle has normal function. The left ventricle has no regional wall motion abnormalities. The left ventricular internal cavity size was normal in size. There is  no left ventricular hypertrophy. Left ventricular diastolic parameters are consistent with Grade I diastolic dysfunction (impaired relaxation). Right Ventricle: The right ventricular size is normal. Right ventricular systolic function is normal. Left Atrium: Left atrial size was normal in size. Right Atrium: Right atrial size was normal in size. Pericardium: There is no evidence of pericardial effusion. Mitral Valve: The mitral valve is normal in structure. There is  mild thickening of the mitral valve leaflet(s). Trivial mitral valve regurgitation. No evidence of mitral valve stenosis. Tricuspid Valve: The tricuspid valve is normal in structure. Tricuspid valve regurgitation is trivial. No evidence of tricuspid stenosis. Aortic Valve: The aortic valve is tricuspid. Aortic valve regurgitation is not visualized. Aortic valve sclerosis/calcification is present, without any evidence of aortic stenosis. Aortic valve peak gradient measures 5.3 mmHg. Pulmonic Valve: The pulmonic valve was normal in structure. Pulmonic valve regurgitation is not visualized. No evidence of pulmonic stenosis. Aorta: The aortic root is normal in size and structure. Venous: The inferior vena cava is normal in size with greater than 50% respiratory variability, suggesting right atrial pressure of 3 mmHg. IAS/Shunts: No atrial level shunt detected by color flow Doppler.  LEFT VENTRICLE PLAX 2D LVIDd:         4.70 cm     Diastology LVIDs:         2.90 cm     LV e' medial:    5.33 cm/s LV PW:         1.10 cm     LV E/e' medial:  9.1 LV IVS:        0.90 cm     LV e' lateral:   6.85 cm/s LVOT diam:     2.00 cm     LV E/e' lateral: 7.1 LV SV:         46 LV SV Index:   20 LVOT Area:     3.14 cm  LV Volumes (MOD) LV vol d, MOD A2C: 79.0 ml LV vol d, MOD A4C: 88.8 ml LV vol s, MOD A2C: 31.8 ml LV vol s, MOD A4C: 38.7 ml LV SV MOD A2C:     47.2 ml LV SV MOD A4C:     88.8 ml LV SV MOD BP:  49.1 ml RIGHT VENTRICLE             IVC RV Basal diam:  1.90 cm     IVC diam: 1.30 cm RV Mid diam:    1.70 cm RV S prime:     10.10 cm/s TAPSE (M-mode): 1.6 cm LEFT ATRIUM             Index        RIGHT ATRIUM           Index LA diam:        3.90 cm 1.68 cm/m   RA Area:     10.70 cm LA Vol (A2C):   35.8 ml 15.44 ml/m  RA Volume:   19.00 ml  8.20 ml/m LA Vol (A4C):   31.7 ml 13.67 ml/m LA Biplane Vol: 34.5 ml 14.88 ml/m  AORTIC VALVE AV Area (Vmax): 2.15 cm AV Vmax:        115.00 cm/s AV Peak Grad:   5.3 mmHg LVOT Vmax:       78.80 cm/s LVOT Vmean:     58.600 cm/s LVOT VTI:       0.145 m  AORTA Ao Root diam: 3.40 cm Ao Asc diam:  3.20 cm MITRAL VALVE MV Area (PHT): 3.89 cm    SHUNTS MV Decel Time: 195 msec    Systemic VTI:  0.14 m MV E velocity: 48.40 cm/s  Systemic Diam: 2.00 cm MV A velocity: 65.70 cm/s MV E/A ratio:  0.74 Kirk Ruths MD Electronically signed by Kirk Ruths MD Signature Date/Time: 12/28/2021/2:44:36 PM    Final         Scheduled Meds:  (feeding supplement) PROSource Plus  30 mL Oral BID   amLODipine  5 mg Oral Daily   atorvastatin  80 mg Oral QHS   carvedilol  12.5 mg Oral BID WC   clonazePAM  0.5 mg Oral QHS   clopidogrel  75 mg Oral Daily   DULoxetine  60 mg Oral QHS   gabapentin  300 mg Oral BID   heparin injection (subcutaneous)  5,000 Units Subcutaneous Q8H   insulin aspart  0-15 Units Subcutaneous TID WC   insulin aspart  0-5 Units Subcutaneous QHS   isosorbide mononitrate  60 mg Oral Daily   multivitamin with minerals  1 tablet Oral Daily   mupirocin ointment  1 Application Nasal BID   pantoprazole  40 mg Oral Daily   Continuous Infusions:  sodium chloride 20 mL/hr at 12/25/21 1907    ceFAZolin (ANCEF) IV 2 g (12/30/21 0607)     LOS: 5 days    Time spent: 45 minutes spent on chart review, discussion with nursing staff, consultants, updating family and interview/physical exam; more than 50% of that time was spent in counseling and/or coordination of care.    Geradine Girt, DO [facility] Available via Epic secure chat 7am-7pm After these hours, please refer to coverage provider listed on [email or web address] 12/30/2021, 1:18 PM

## 2021-12-31 ENCOUNTER — Inpatient Hospital Stay: Payer: Self-pay

## 2021-12-31 DIAGNOSIS — E11628 Type 2 diabetes mellitus with other skin complications: Secondary | ICD-10-CM

## 2021-12-31 DIAGNOSIS — K7460 Unspecified cirrhosis of liver: Secondary | ICD-10-CM | POA: Diagnosis not present

## 2021-12-31 DIAGNOSIS — L089 Local infection of the skin and subcutaneous tissue, unspecified: Secondary | ICD-10-CM

## 2021-12-31 DIAGNOSIS — E1122 Type 2 diabetes mellitus with diabetic chronic kidney disease: Secondary | ICD-10-CM | POA: Diagnosis not present

## 2021-12-31 DIAGNOSIS — N179 Acute kidney failure, unspecified: Secondary | ICD-10-CM | POA: Diagnosis not present

## 2021-12-31 DIAGNOSIS — R7881 Bacteremia: Secondary | ICD-10-CM | POA: Diagnosis not present

## 2021-12-31 DIAGNOSIS — M869 Osteomyelitis, unspecified: Secondary | ICD-10-CM | POA: Diagnosis not present

## 2021-12-31 LAB — CBC
HCT: 40.3 % (ref 39.0–52.0)
Hemoglobin: 13.4 g/dL (ref 13.0–17.0)
MCH: 29.1 pg (ref 26.0–34.0)
MCHC: 33.3 g/dL (ref 30.0–36.0)
MCV: 87.4 fL (ref 80.0–100.0)
Platelets: 208 10*3/uL (ref 150–400)
RBC: 4.61 MIL/uL (ref 4.22–5.81)
RDW: 14.7 % (ref 11.5–15.5)
WBC: 6.1 10*3/uL (ref 4.0–10.5)
nRBC: 0 % (ref 0.0–0.2)

## 2021-12-31 LAB — COMPREHENSIVE METABOLIC PANEL
ALT: 14 U/L (ref 0–44)
AST: 25 U/L (ref 15–41)
Albumin: 3.4 g/dL — ABNORMAL LOW (ref 3.5–5.0)
Alkaline Phosphatase: 92 U/L (ref 38–126)
Anion gap: 9 (ref 5–15)
BUN: 20 mg/dL (ref 8–23)
CO2: 26 mmol/L (ref 22–32)
Calcium: 9.4 mg/dL (ref 8.9–10.3)
Chloride: 103 mmol/L (ref 98–111)
Creatinine, Ser: 1.52 mg/dL — ABNORMAL HIGH (ref 0.61–1.24)
GFR, Estimated: 51 mL/min — ABNORMAL LOW (ref >60)
Glucose, Bld: 159 mg/dL — ABNORMAL HIGH (ref 70–99)
Potassium: 3.9 mmol/L (ref 3.5–5.1)
Sodium: 138 mmol/L (ref 135–145)
Total Bilirubin: 1 mg/dL (ref 0.3–1.2)
Total Protein: 8 g/dL (ref 6.5–8.1)

## 2021-12-31 LAB — PROTIME-INR
INR: 1.1 (ref 0.8–1.2)
Prothrombin Time: 13.7 seconds (ref 11.4–15.2)

## 2021-12-31 LAB — GLUCOSE, CAPILLARY
Glucose-Capillary: 109 mg/dL — ABNORMAL HIGH (ref 70–99)
Glucose-Capillary: 122 mg/dL — ABNORMAL HIGH (ref 70–99)
Glucose-Capillary: 130 mg/dL — ABNORMAL HIGH (ref 70–99)
Glucose-Capillary: 160 mg/dL — ABNORMAL HIGH (ref 70–99)
Glucose-Capillary: 168 mg/dL — ABNORMAL HIGH (ref 70–99)

## 2021-12-31 LAB — MAGNESIUM: Magnesium: 1.8 mg/dL (ref 1.7–2.4)

## 2021-12-31 MED ORDER — SODIUM CHLORIDE 0.9% FLUSH: 10.0000 mL | INTRAVENOUS | Status: DC | PRN

## 2021-12-31 MED ORDER — CHLORHEXIDINE GLUCONATE CLOTH 2 % EX PADS
6.0000 | MEDICATED_PAD | Freq: Every day | CUTANEOUS | Status: DC
  Administered 2021-12-31 – 2022-01-01 (×2): 6 via TOPICAL

## 2021-12-31 NOTE — Progress Notes (Signed)
Mobility Specialist Progress Note   12/31/21 1715  Mobility  Activity Ambulated independently in hallway  Level of Assistance Modified independent, requires aide device or extra time  Assistive Device  (IV pole)  LLE Weight Bearing WBAT  Distance Ambulated (ft) 1100 ft  Activity Response Tolerated well  $Mobility charge 1 Mobility   Received pt in hallway having no complaints and mobilizing well. Pt was asymptomatic throughout ambulation and returned to room w/o fault. Left in bed w/ call bell in reach and all needs met.  Holland Falling Mobility Specialist MS [facility] #:  [id number] Acute Rehab Office:  [phone]

## 2021-12-31 NOTE — H&P (View-Only) (Signed)
[facility]  Date of Admission:  12/25/2021           Reason for visit: Follow up on MSSA bacteremia  Current antibiotics: Cefazolin  ASSESSMENT:    66 y.o. male admitted with:  MSSA bacteremia: Due to osteomyelitis of left great toe s/p left great toe amputation 7/1 by podiatry.  6/29 blood cultures positive and repeat cultures 7/1 NGTD. OR cultures also growing Staph aureus and GBS (covered by cefazolin).   TTE did not show obvious vegetation and TEE is planned for tomorrow.  Type 2 DM: A1c in May was 5.9. Sepsis: Due to #1 and resolved. RA: on Humira. History of lumbar PLIF with instrumentation in Sept 2020. History of post-traumatic hydrocephalus s/p VP shunt in ~2007. History of left hip replacement about 15 years ago. Cirrhosis: Well  compensated.  RECOMMENDATIONS:    Continue cefazolin Place PICC line TEE tomorrow to determine final antibiotic plan If TEE is unremarkable, will entertain option of 2 weeks IV therapy followed by oral therapy.  This is a more non-traditional approach but may be more suitable given his upcoming travel to [place] on 01/12/22 and would avoid him having to do so with a PICC line in place If he does convert early to PO therapy then would consider high dose cefadroxil vs linezolid Will follow   Principal Problem:   MSSA bacteremia Active Problems:   Diabetes with neuropathy   Coronary artery disease   Cirrhosis ([place])   Hypertension   Osteomyelitis of great toe of left foot (HCC)   Acute renal failure superimposed on stage 3a chronic kidney disease ([place])    MEDICATIONS:    Scheduled Meds: . (feeding supplement) PROSource Plus  30 mL Oral BID  . amLODipine  5 mg Oral Daily  . atorvastatin  80 mg Oral QHS  . carvedilol  12.5 mg Oral BID WC  . clonazePAM  0.5 mg Oral QHS  . clopidogrel  75 mg Oral Daily  . DULoxetine  60 mg Oral QHS  . gabapentin  300 mg Oral BID  . heparin injection (subcutaneous)   5,000 Units Subcutaneous Q8H  . insulin aspart  0-15 Units Subcutaneous TID WC  . insulin aspart  0-5 Units Subcutaneous QHS  . isosorbide mononitrate  60 mg Oral Daily  . multivitamin with minerals  1 tablet Oral Daily  . pantoprazole  40 mg Oral Daily   Continuous Infusions: . sodium chloride 20 mL/hr at 12/25/21 1907  .  ceFAZolin (ANCEF) IV 2 g (12/31/21 0524)   PRN Meds:.sodium chloride, acetaminophen **OR** acetaminophen, HYDROmorphone (DILAUDID) injection, oxyCODONE  SUBJECTIVE:   24 hour events:  No events noted ABI unremarkable Tolerated amputation TEE tmrw 7/1 OR cx = Staph aureus and GBS 7/1 surg path = pending 7/1 blood cx = NGTD 6/29 blood cx = MSSA   Patient seen up and moving about after using the bathroom.  No complaints.  Understands TEE planned for tomorrow.  He is going on vacation to [place] on the 17th of July.   Review of Systems  All other systems reviewed and are negative.     OBJECTIVE:   Blood pressure 113/85, pulse 74, temperature 98.6 F (37 C), temperature source Oral, resp. rate 18, height 5' 10.98" (1.803 m), weight 113.4 kg, SpO2 91 %. Body mass index is 34.88 kg/m.  Physical Exam Constitutional:      General: He is not in acute distress.    Appearance: Normal appearance.  HENT:     Head: Normocephalic and atraumatic.  Eyes:     Extraocular Movements: Extraocular movements intact.     Conjunctiva/sclera: Conjunctivae normal.  Pulmonary:     Effort: Pulmonary effort is normal. No respiratory distress.  Abdominal:     General: There is no distension.     Palpations: Abdomen is soft.  Musculoskeletal:     Cervical back: Normal range of motion and neck supple.     Comments: Left foot wrapped and in Darco boot.   Skin:    General: Skin is warm and dry.     Findings: No rash.  Neurological:     General: No focal deficit present.     Mental Status: He is alert and oriented to person, place, and time.  Psychiatric:        Mood and  Affect: Mood normal.        Behavior: Behavior normal.      Lab Results: Lab Results  Component Value Date   WBC 6.2 12/28/2021   HGB 12.6 (L) 12/28/2021   HCT 37.0 (L) 12/28/2021   MCV 89.2 12/28/2021   PLT 116 (L) 12/28/2021    Lab Results  Component Value Date   NA 140 12/28/2021   K 4.2 12/28/2021   CO2 24 12/28/2021   GLUCOSE 120 (H) 12/28/2021   BUN 18 12/28/2021   CREATININE 1.53 (H) 12/28/2021   CALCIUM 9.4 12/28/2021   GFRNONAA 51 (L) 12/28/2021   GFRAA 66 08/08/2020    Lab Results  Component Value Date   ALT 18 10/24/2021   AST 15 10/24/2021   ALKPHOS 78 10/24/2021   BILITOT 1.8 (H) 10/24/2021       Component Value Date/Time   CRP 10.1 (H) 12/27/2021 0109       Component Value Date/Time   ESRSEDRATE 31 (H) 12/27/2021 0109     I have reviewed the micro and lab results in Epic.  Imaging: VAS [place] ABI WITH/WO TBI  Result Date: 12/29/2021  LOWER EXTREMITY DOPPLER STUDY Patient Name:  James [name]  Date of Exam:   12/29/2021 Medical Rec #: [id number]        Accession #:    [id number] Date of Birth: 02/03/1956         Patient Gender: M Patient Age:   56 years Exam [facility] Procedure:      VAS [place] ABI WITH/WO TBI Referring Phys: Jule Ser --------------------------------------------------------------------------------  Indications: Ulceration. High Risk Factors: Hypertension, Diabetes.  Vascular Interventions: 12/27/2021 - Left AMPUTATION GREAT TOE. Comparison Study: No prior studies. Performing Technologist: Carlos Levering RVT  Examination Guidelines: A complete evaluation includes at minimum, Doppler waveform signals and systolic blood pressure reading at the level of bilateral brachial, anterior tibial, and posterior tibial arteries, when vessel segments are accessible. Bilateral testing is considered an integral part of a complete examination. Photoelectric Plethysmograph (PPG) waveforms and toe systolic pressure readings are included as  required and additional duplex testing as needed. Limited examinations for reoccurring indications may be performed as noted.  ABI Findings: +---------+------------------+-----+---------+--------+ Right    Rt Pressure (mmHg)IndexWaveform Comment  +---------+------------------+-----+---------+--------+ Brachial 123                    triphasic         +---------+------------------+-----+---------+--------+ PTA      152               1.23 triphasic         +---------+------------------+-----+---------+--------+  DP       15                0.12 triphasic         +---------+------------------+-----+---------+--------+ Great Toe67                0.54                   +---------+------------------+-----+---------+--------+ +---------+------------------+-----+---------+----------+ Left     Lt Pressure (mmHg)IndexWaveform Comment    +---------+------------------+-----+---------+----------+ Brachial 124                    triphasic           +---------+------------------+-----+---------+----------+ PTA      158               1.27 triphasic           +---------+------------------+-----+---------+----------+ DP       158               1.27 triphasic           +---------+------------------+-----+---------+----------+ Great Toe                                Amputation +---------+------------------+-----+---------+----------+ +-------+-----------+-----------+------------+------------+ ABI/TBIToday's ABIToday's TBIPrevious ABIPrevious TBI +-------+-----------+-----------+------------+------------+ Right  1.24       0.54                                +-------+-----------+-----------+------------+------------+ Left   1.27                                           +-------+-----------+-----------+------------+------------+  Summary: Right: Resting right ankle-brachial index is within normal range. No evidence of significant right lower extremity arterial  disease. The right toe-brachial index is abnormal. Left: Resting left ankle-brachial index is within normal range. No evidence of significant left lower extremity arterial disease. Unable to obtain TBI due to great toe amputation. *See table(s) above for measurements and observations.  Electronically signed by Deitra Mayo MD on 12/29/2021 at 4:24:16 PM.    Final      Imaging  independently reviewed in Epic.    Raynelle Highland for [facility] [phone] pager 12/31/2021, 8:08 AM

## 2021-12-31 NOTE — Plan of Care (Signed)
  Problem: Pain Managment: Goal: General experience of comfort will improve Outcome: Progressing   Problem: Safety: Goal: Ability to remain free from injury will improve Outcome: Progressing   Problem: Skin Integrity: Goal: Risk for impaired skin integrity will decrease Outcome: Progressing   

## 2021-12-31 NOTE — Progress Notes (Signed)
    Shared Decision Making/Informed Consent \ The risks [esophageal damage, perforation (1:10,000 risk), bleeding, pharyngeal hematoma as well as other potential complications associated with conscious sedation including aspiration, arrhythmia, respiratory failure and death], benefits (treatment guidance and diagnostic support) and alternatives of a transesophageal echocardiogram were discussed in detail with James [name] and he is willing to proceed.    TEE scheduled tomorrow 01/01/22, NPO after midnight, will repeat labs today, noted compensated cirrhosis. Orders placed.

## 2021-12-31 NOTE — Progress Notes (Signed)
[facility]  PROGRESS NOTE  James [name] EHM:[id number] DOB: 04-13-1956 DOA: 12/25/2021 PCP: Colon Branch, MD   Brief HPI:   66 y.o. male with medical history significant for coronary artery disease, cirrhosis, hypertension, OSA with CPAP intolerance, well-controlled type 2 diabetes mellitus, and CKD 3B, now presenting to the emergency department with pain, redness, and swelling involving the left first toe.  Patient reports that he had an ingrown toenail that he removed himself 2 weeks ago, and then noted this morning that the toe was red, sore, and swollen. Found to have bacteremia and osteomyelitis.  TEE pending for Thursday.    Subjective  Patient denies pain.  Underwent amputation of left big toe.  Plan for TEE on Thursday.     Assessment/Plan:    Left big toe osteomyelitis -Patient presented with left big toe pain, swelling and redness after removing ingrown toenail 2 weeks ago -Podiatry was consulted, s/p amputation of left big toe -Currently on IV cefazolin -MRI ankle showed no acute osseous abnormality -ABI unremarkable  MSSA bacteremia -ID consulted -Continue IV cefazolin -Plan for TEE in a.m.  Acute kidney injury on CKD stage IIIa -Improved with hydration -Creatinine 1.52; at baseline  CAD -Stent placed in April 2022 -No chest pain -Continue Lipitor, Plavix  Hypertension -Continue Norvasc, Coreg  Diabetes mellitus type 2 -CBG well controlled -Continue sliding scale insulin with NovoLog  Liver cirrhosis -Stable  Obesity Estimated body mass index is 34.88 kg/m as calculated from the following:   Height as of this encounter: 5' 10.98" (1.803 m).   Weight as of this encounter: 113.4 kg   Medications     (feeding supplement) PROSource Plus  30 mL Oral BID   amLODipine  5 mg Oral Daily   atorvastatin  80 mg Oral QHS   carvedilol  12.5 mg Oral BID WC   Chlorhexidine Gluconate Cloth  6 each Topical Daily   clonazePAM  0.5 mg Oral QHS    clopidogrel  75 mg Oral Daily   DULoxetine  60 mg Oral QHS   gabapentin  300 mg Oral BID   heparin injection (subcutaneous)  5,000 Units Subcutaneous Q8H   insulin aspart  0-15 Units Subcutaneous TID WC   insulin aspart  0-5 Units Subcutaneous QHS   isosorbide mononitrate  60 mg Oral Daily   multivitamin with minerals  1 tablet Oral Daily   pantoprazole  40 mg Oral Daily     Data Reviewed:   CBG:  Recent Labs  Lab 12/30/21 2329 12/31/21 0410 12/31/21 0719 12/31/21 1141 12/31/21 1615  GLUCAP 115* 109* 130* 168* 122*    SpO2: 95 %    Vitals:   12/30/21 2100 12/31/21 0458 12/31/21 0722 12/31/21 1618  BP: 121/79 116/88 113/85 124/81  Pulse: 81 74 74 75  Resp: 16 16 18 17   Temp: 98.6 F (37 C) 98.2 F (36.8 C) 98.6 F (37 C) 97.6 F (36.4 C)  TempSrc: Oral Oral Oral Oral  SpO2: 96% 90% 91% 95%  Weight:      Height:          Data Reviewed:  Basic Metabolic Panel: Recent Labs  Lab 12/25/21 1734 12/26/21 0450 12/27/21 0109 12/28/21 0041 12/31/21 1003 12/31/21 1004  NA 135 137 137 140  --  138  K 4.4 3.9 4.2 4.2  --  3.9  CL 105 106 108 107  --  103  CO2 23 23 22 24   --  26  GLUCOSE 176* 127* 125*  120*  --  159*  BUN 25* 18 17 18   --  20  CREATININE 1.80* 1.60* 1.56* 1.53*  --  1.52*  CALCIUM 9.7 9.6 9.5 9.4  --  9.4  MG  --   --   --   --  1.8  --     CBC: Recent Labs  Lab 12/25/21 1734 12/26/21 0450 12/27/21 0109 12/28/21 0041 12/31/21 1003  WBC 10.0 8.7 8.3 6.2 6.1  NEUTROABS 7.4  --   --   --   --   HGB 14.2 13.5 12.3* 12.6* 13.4  HCT 41.3 40.7 36.2* 37.0* 40.3  MCV 86.9 87.7 87.9 89.2 87.4  PLT 159 118* 125* 116* 208    LFT Recent Labs  Lab 12/31/21 1004  AST 25  ALT 14  ALKPHOS 92  BILITOT 1.0  PROT 8.0  ALBUMIN 3.4*     Antibiotics: Anti-infectives (From admission, onward)    Start     Dose/Rate Route Frequency Ordered Stop   12/27/21 0600  vancomycin (VANCOREADY) IVPB 1500 mg/300 mL  Status:  Discontinued        1,500  mg 150 mL/hr over 120 Minutes Intravenous Every 24 hours 12/26/21 0518 12/26/21 1826   12/26/21 2200  ceFAZolin (ANCEF) IVPB 2g/100 mL premix        2 g 200 mL/hr over 30 Minutes Intravenous Every 8 hours 12/26/21 1826     12/26/21 0615  vancomycin (VANCOREADY) IVPB 2000 mg/400 mL        2,000 mg 200 mL/hr over 120 Minutes Intravenous  Once 12/26/21 0518 12/26/21 2043   12/25/21 2015  ceFEPIme (MAXIPIME) 2 g in sodium chloride 0.9 % 100 mL IVPB  Status:  Discontinued        2 g 200 mL/hr over 30 Minutes Intravenous Every 12 hours 12/25/21 2001 12/26/21 1826   12/25/21 1915  ceFEPIme (MAXIPIME) 2 g in sodium chloride 0.9 % 100 mL IVPB  Status:  Discontinued        2 g 200 mL/hr over 30 Minutes Intravenous Every 12 hours 12/25/21 1906 12/25/21 2001   12/25/21 1900  metroNIDAZOLE (FLAGYL) IVPB 500 mg  Status:  Discontinued        500 mg 100 mL/hr over 60 Minutes Intravenous Every 12 hours 12/25/21 1857 12/26/21 1826        DVT prophylaxis: Heparin  Code Status: Full code  Family Communication: No family at bedside   CONSULTS podiatry   Objective    Physical Examination:   General-appears in no acute distress Heart-S1-S2, regular, no murmur auscultated Lungs-clear to auscultation bilaterally, no wheezing or crackles auscultated Abdomen-soft, nontender, no organomegaly Extremities-no edema in the lower extremities Foot-left foot in dressing Neuro-alert, oriented x3, no focal deficit noted   Status is: Inpatient: S/p amputation of left big toe        [place]   [facility] If 7PM-7AM, please contact night-coverage at [email or web address], Office  [phone]   12/31/2021, 4:33 PM  LOS: 6 days

## 2021-12-31 NOTE — Progress Notes (Signed)
Peripherally Inserted Central Catheter Placement  The IV Nurse has discussed with the patient and/or persons authorized to consent for the patient, the purpose of this procedure and the potential benefits and risks involved with this procedure.  The benefits include less needle sticks, lab draws from the catheter, and the patient may be discharged home with the catheter. Risks include, but not limited to, infection, bleeding, blood clot (thrombus formation), and puncture of an artery; nerve damage and irregular heartbeat and possibility to perform a PICC exchange if needed/ordered by physician.  Alternatives to this procedure were also discussed.  Bard Power PICC patient education guide, fact sheet on infection prevention and patient information card has been provided to patient /or left at bedside.    PICC Placement Documentation  PICC Single Lumen 90/30/09 Right Basilic 43 cm 0 cm (Active)  Indication for Insertion or Continuance of Line Home intravenous therapies (PICC only) 12/31/21 1232  Exposed Catheter (cm) 0 cm 12/31/21 1232  Site Assessment Clean, Dry, Intact 12/31/21 1232  Line Status Flushed;Saline locked;No blood return 12/31/21 1232  Dressing Type Transparent;Securing device 12/31/21 1232  Dressing Status Antimicrobial disc in place 12/31/21 1232  Dressing Intervention New dressing;Other (Comment) 12/31/21 1232  Dressing Change Due 01/07/22 12/31/21 1232       Christella Noa Albarece 12/31/2021, 12:34 PM

## 2021-12-31 NOTE — Progress Notes (Signed)
[facility] for Infectious Disease  Date of Admission:  12/25/2021           Reason for visit: Follow up on MSSA bacteremia  Current antibiotics: Cefazolin  ASSESSMENT:    66 y.o. male admitted with:  MSSA bacteremia: Due to osteomyelitis of left great toe s/p left great toe amputation 7/1 by podiatry.  6/29 blood cultures positive and repeat cultures 7/1 NGTD. OR cultures also growing Staph aureus and GBS (covered by cefazolin).   TTE did not show obvious vegetation and TEE is planned for tomorrow.  Type 2 DM: A1c in May was 5.9. Sepsis: Due to #1 and resolved. RA: on Humira. History of lumbar PLIF with instrumentation in Sept 2020. History of post-traumatic hydrocephalus s/p VP shunt in ~2007. History of left hip replacement about 15 years ago. Cirrhosis: Well  compensated.  RECOMMENDATIONS:    Continue cefazolin Place PICC line TEE tomorrow to determine final antibiotic plan If TEE is unremarkable, will entertain option of 2 weeks IV therapy followed by oral therapy.  This is a more non-traditional approach but may be more suitable given his upcoming travel to [place] on 01/12/22 and would avoid him having to do so with a PICC line in place If he does convert early to PO therapy then would consider high dose cefadroxil vs linezolid Will follow   Principal Problem:   MSSA bacteremia Active Problems:   Diabetes with neuropathy   Coronary artery disease   Cirrhosis ([place])   Hypertension   Osteomyelitis of great toe of left foot (HCC)   Acute renal failure superimposed on stage 3a chronic kidney disease ([place])    MEDICATIONS:    Scheduled Meds: . (feeding supplement) PROSource Plus  30 mL Oral BID  . amLODipine  5 mg Oral Daily  . atorvastatin  80 mg Oral QHS  . carvedilol  12.5 mg Oral BID WC  . clonazePAM  0.5 mg Oral QHS  . clopidogrel  75 mg Oral Daily  . DULoxetine  60 mg Oral QHS  . gabapentin  300 mg Oral BID  . heparin injection (subcutaneous)   5,000 Units Subcutaneous Q8H  . insulin aspart  0-15 Units Subcutaneous TID WC  . insulin aspart  0-5 Units Subcutaneous QHS  . isosorbide mononitrate  60 mg Oral Daily  . multivitamin with minerals  1 tablet Oral Daily  . pantoprazole  40 mg Oral Daily   Continuous Infusions: . sodium chloride 20 mL/hr at 12/25/21 1907  .  ceFAZolin (ANCEF) IV 2 g (12/31/21 0524)   PRN Meds:.sodium chloride, acetaminophen **OR** acetaminophen, HYDROmorphone (DILAUDID) injection, oxyCODONE  SUBJECTIVE:   24 hour events:  No events noted ABI unremarkable Tolerated amputation TEE tmrw 7/1 OR cx = Staph aureus and GBS 7/1 surg path = pending 7/1 blood cx = NGTD 6/29 blood cx = MSSA   Patient seen up and moving about after using the bathroom.  No complaints.  Understands TEE planned for tomorrow.  He is going on vacation to [place] on the 17th of July.   Review of Systems  All other systems reviewed and are negative.     OBJECTIVE:   Blood pressure 113/85, pulse 74, temperature 98.6 F (37 C), temperature source Oral, resp. rate 18, height 5' 10.98" (1.803 m), weight 113.4 kg, SpO2 91 %. Body mass index is 34.88 kg/m.  Physical Exam Constitutional:      General: He is not in acute distress.    Appearance: Normal appearance.  HENT:     Head: Normocephalic and atraumatic.  Eyes:     Extraocular Movements: Extraocular movements intact.     Conjunctiva/sclera: Conjunctivae normal.  Pulmonary:     Effort: Pulmonary effort is normal. No respiratory distress.  Abdominal:     General: There is no distension.     Palpations: Abdomen is soft.  Musculoskeletal:     Cervical back: Normal range of motion and neck supple.     Comments: Left foot wrapped and in Darco boot.   Skin:    General: Skin is warm and dry.     Findings: No rash.  Neurological:     General: No focal deficit present.     Mental Status: He is alert and oriented to person, place, and time.  Psychiatric:        Mood and  Affect: Mood normal.        Behavior: Behavior normal.      Lab Results: Lab Results  Component Value Date   WBC 6.2 12/28/2021   HGB 12.6 (L) 12/28/2021   HCT 37.0 (L) 12/28/2021   MCV 89.2 12/28/2021   PLT 116 (L) 12/28/2021    Lab Results  Component Value Date   NA 140 12/28/2021   K 4.2 12/28/2021   CO2 24 12/28/2021   GLUCOSE 120 (H) 12/28/2021   BUN 18 12/28/2021   CREATININE 1.53 (H) 12/28/2021   CALCIUM 9.4 12/28/2021   GFRNONAA 51 (L) 12/28/2021   GFRAA 66 08/08/2020    Lab Results  Component Value Date   ALT 18 10/24/2021   AST 15 10/24/2021   ALKPHOS 78 10/24/2021   BILITOT 1.8 (H) 10/24/2021       Component Value Date/Time   CRP 10.1 (H) 12/27/2021 0109       Component Value Date/Time   ESRSEDRATE 31 (H) 12/27/2021 0109     I have reviewed the micro and lab results in Epic.  Imaging: VAS [place] ABI WITH/WO TBI  Result Date: 12/29/2021  LOWER EXTREMITY DOPPLER STUDY Patient Name:  James [name]  Date of Exam:   12/29/2021 Medical Rec #: [id number]        Accession #:    [id number] Date of Birth: [birth year]         Patient Gender: M Patient Age:   35 years Exam Location:  [facility] Procedure:      VAS [place] ABI WITH/WO TBI Referring Phys: Jule Ser --------------------------------------------------------------------------------  Indications: Ulceration. High Risk Factors: Hypertension, Diabetes.  Vascular Interventions: 12/27/2021 - Left AMPUTATION GREAT TOE. Comparison Study: No prior studies. Performing Technologist: Carlos Levering RVT  Examination Guidelines: A complete evaluation includes at minimum, Doppler waveform signals and systolic blood pressure reading at the level of bilateral brachial, anterior tibial, and posterior tibial arteries, when vessel segments are accessible. Bilateral testing is considered an integral part of a complete examination. Photoelectric Plethysmograph (PPG) waveforms and toe systolic pressure readings are included as  required and additional duplex testing as needed. Limited examinations for reoccurring indications may be performed as noted.  ABI Findings: +---------+------------------+-----+---------+--------+ Right    Rt Pressure (mmHg)IndexWaveform Comment  +---------+------------------+-----+---------+--------+ Brachial 123                    triphasic         +---------+------------------+-----+---------+--------+ PTA      152               1.23 triphasic         +---------+------------------+-----+---------+--------+  DP       15                0.12 triphasic         +---------+------------------+-----+---------+--------+ Great Toe67                0.54                   +---------+------------------+-----+---------+--------+ +---------+------------------+-----+---------+----------+ Left     Lt Pressure (mmHg)IndexWaveform Comment    +---------+------------------+-----+---------+----------+ Brachial 124                    triphasic           +---------+------------------+-----+---------+----------+ PTA      158               1.27 triphasic           +---------+------------------+-----+---------+----------+ DP       158               1.27 triphasic           +---------+------------------+-----+---------+----------+ Great Toe                                Amputation +---------+------------------+-----+---------+----------+ +-------+-----------+-----------+------------+------------+ ABI/TBIToday's ABIToday's TBIPrevious ABIPrevious TBI +-------+-----------+-----------+------------+------------+ Right  1.24       0.54                                +-------+-----------+-----------+------------+------------+ Left   1.27                                           +-------+-----------+-----------+------------+------------+  Summary: Right: Resting right ankle-brachial index is within normal range. No evidence of significant right lower extremity arterial  disease. The right toe-brachial index is abnormal. Left: Resting left ankle-brachial index is within normal range. No evidence of significant left lower extremity arterial disease. Unable to obtain TBI due to great toe amputation. *See table(s) above for measurements and observations.  Electronically signed by Deitra Mayo MD on 12/29/2021 at 4:24:16 PM.    Final      Imaging  independently reviewed in Epic.    Raynelle High[facility] for [facility] [phone] pager 12/31/2021, 8:08 AM

## 2021-12-31 NOTE — Plan of Care (Signed)
  Problem: Education: Goal: Knowledge of General Education information will improve Description Including pain rating scale, medication(s)/side effects and non-pharmacologic comfort measures Outcome: Progressing   

## 2022-01-01 ENCOUNTER — Inpatient Hospital Stay (HOSPITAL_COMMUNITY): Payer: 59 | Admitting: Anesthesiology

## 2022-01-01 ENCOUNTER — Inpatient Hospital Stay (HOSPITAL_COMMUNITY): Payer: 59

## 2022-01-01 ENCOUNTER — Other Ambulatory Visit (HOSPITAL_COMMUNITY): Payer: Self-pay

## 2022-01-01 ENCOUNTER — Encounter (HOSPITAL_COMMUNITY)
Admission: EM | Disposition: A | Discharge status: Home / Self Care | Payer: Self-pay | Source: Home / Self Care | Attending: Internal Medicine

## 2022-01-01 ENCOUNTER — Encounter (HOSPITAL_COMMUNITY): Payer: Self-pay | Admitting: Family Medicine

## 2022-01-01 DIAGNOSIS — I340 Nonrheumatic mitral (valve) insufficiency: Secondary | ICD-10-CM | POA: Diagnosis not present

## 2022-01-01 DIAGNOSIS — E1122 Type 2 diabetes mellitus with diabetic chronic kidney disease: Secondary | ICD-10-CM | POA: Diagnosis not present

## 2022-01-01 DIAGNOSIS — I2510 Atherosclerotic heart disease of native coronary artery without angina pectoris: Secondary | ICD-10-CM | POA: Diagnosis not present

## 2022-01-01 DIAGNOSIS — R7881 Bacteremia: Secondary | ICD-10-CM

## 2022-01-01 DIAGNOSIS — I10 Essential (primary) hypertension: Secondary | ICD-10-CM | POA: Diagnosis not present

## 2022-01-01 DIAGNOSIS — G4730 Sleep apnea, unspecified: Secondary | ICD-10-CM

## 2022-01-01 DIAGNOSIS — M869 Osteomyelitis, unspecified: Secondary | ICD-10-CM | POA: Diagnosis not present

## 2022-01-01 DIAGNOSIS — L089 Local infection of the skin and subcutaneous tissue, unspecified: Secondary | ICD-10-CM | POA: Diagnosis not present

## 2022-01-01 DIAGNOSIS — N179 Acute kidney failure, unspecified: Secondary | ICD-10-CM | POA: Diagnosis not present

## 2022-01-01 DIAGNOSIS — E11628 Type 2 diabetes mellitus with other skin complications: Secondary | ICD-10-CM | POA: Diagnosis not present

## 2022-01-01 HISTORY — PX: TEE WITHOUT CARDIOVERSION: SHX5443

## 2022-01-01 LAB — AEROBIC/ANAEROBIC CULTURE W GRAM STAIN (SURGICAL/DEEP WOUND): Gram Stain: NONE SEEN

## 2022-01-01 LAB — BASIC METABOLIC PANEL
Anion gap: 6 (ref 5–15)
BUN: 21 mg/dL (ref 8–23)
CO2: 25 mmol/L (ref 22–32)
Calcium: 9.1 mg/dL (ref 8.9–10.3)
Chloride: 107 mmol/L (ref 98–111)
Creatinine, Ser: 1.51 mg/dL — ABNORMAL HIGH (ref 0.61–1.24)
GFR, Estimated: 52 mL/min — ABNORMAL LOW (ref >60)
Glucose, Bld: 105 mg/dL — ABNORMAL HIGH (ref 70–99)
Potassium: 4.3 mmol/L (ref 3.5–5.1)
Sodium: 138 mmol/L (ref 135–145)

## 2022-01-01 LAB — CULTURE, BLOOD (ROUTINE X 2)
Culture: NO GROWTH
Culture: NO GROWTH
Special Requests: ADEQUATE

## 2022-01-01 LAB — GLUCOSE, CAPILLARY
Glucose-Capillary: 135 mg/dL — ABNORMAL HIGH (ref 70–99)
Glucose-Capillary: 138 mg/dL — ABNORMAL HIGH (ref 70–99)

## 2022-01-01 SURGERY — ECHOCARDIOGRAM, TRANSESOPHAGEAL
Anesthesia: Monitor Anesthesia Care

## 2022-01-01 MED ORDER — CEFAZOLIN IV (FOR PTA / DISCHARGE USE ONLY): 2.0000 g | Freq: Three times a day (TID) | INTRAVENOUS | 0 refills | Status: AC

## 2022-01-01 MED ORDER — LINEZOLID 600 MG PO TABS
600.0000 mg | ORAL_TABLET | Freq: Two times a day (BID) | ORAL | 0 refills | Status: AC
  Filled 2022-01-01: qty 28, 14d supply, fill #0

## 2022-01-01 MED ORDER — PROPOFOL 10 MG/ML IV BOLUS
INTRAVENOUS | Status: DC | PRN
  Administered 2022-01-01 (×2): 20 mg via INTRAVENOUS
  Administered 2022-01-01: 40 mg via INTRAVENOUS

## 2022-01-01 MED ORDER — HEPARIN SOD (PORK) LOCK FLUSH 100 UNIT/ML IV SOLN
250.0000 [IU] | INTRAVENOUS | Status: AC | PRN
  Administered 2022-01-01: 250 [IU]

## 2022-01-01 MED ORDER — OXYCODONE HCL 5 MG PO TABS
5.0000 mg | ORAL_TABLET | Freq: Four times a day (QID) | ORAL | 0 refills | Status: DC | PRN
Start: 2022-01-01 — End: 2022-02-02
  Filled 2022-01-01: qty 20, 5d supply, fill #0

## 2022-01-01 MED ORDER — PROPOFOL 500 MG/50ML IV EMUL
INTRAVENOUS | Status: DC | PRN
  Administered 2022-01-01: 75 ug/kg/min via INTRAVENOUS

## 2022-01-01 NOTE — Progress Notes (Signed)
  Echocardiogram Echocardiogram Transesophageal has been performed.  James [name] 01/01/2022, 8:19 AM

## 2022-01-01 NOTE — Progress Notes (Signed)
PT Cancellation Note  Patient Details Name: James [name] MRN: [id number] DOB: Sep 04, 1955   Cancelled Treatment:    Reason Eval/Treat Not Completed: PT screened, no needs identified, will sign off Per chart, patient ambulating independently in hallways. Discussed with patient about any concerns about returning home. No concerns voiced. No skilled PT needs identified acutely. PT will sign off.   Shannia Jacuinde A. Gilford Rile PT, DPT Acute Rehabilitation Services Office [phone]    Linna Hoff 01/01/2022, 10:06 AM

## 2022-01-01 NOTE — CV Procedure (Signed)
    TRANSESOPHAGEAL ECHOCARDIOGRAM   NAME:  James [name]    MRN: [id number] DOB:  Aug 18, 1955    ADMIT DATE: 12/25/2021  INDICATIONS: bacteremia  PROCEDURE:   Informed consent was obtained prior to the procedure. The risks, benefits and alternatives for the procedure were discussed and the patient comprehended these risks.  Risks include, but are not limited to, cough, sore throat, vomiting, nausea, somnolence, esophageal and stomach trauma or perforation, bleeding, low blood pressure, aspiration, pneumonia, infection, trauma to the teeth and death.    Procedural time out performed. The oropharynx was anesthetized with viscous lidocaine.  Anesthesia was administered by the anaesthesilogy team  to achieve and maintain moderate to deep conscious sedation.  The patient's heart rate, blood pressure, and oxygen saturation were monitored continuously during the procedure.  The transesophageal probe was inserted in the esophagus and stomach without difficulty and multiple views were obtained.   The patient tolerated the procedure well.  COMPLICATIONS:    There were no immediate complications.  KEY FINDINGS:  Normal LV E. No apparent vegetation-please follow up with official report, trivial MR.  Full report to follow. Further management per primary team.   Berniece Salines, DO [place]  8:17 AM

## 2022-01-01 NOTE — Progress Notes (Signed)
Discharge instructions given to patient with medication administration details. He verbalized understanding.  Home health to follow him for IV antibiotic administration. Is to follow up with ID, MD, and Podiatry

## 2022-01-01 NOTE — TOC Initial Note (Signed)
Transition of Care [facility]) - Initial/Assessment Note    Patient Details  Name: James [name] MRN: [id number] Date of Birth: May 09, 1956  [facility]) CM/SW Contact:    Sharin Mons, RN Phone Number: 01/01/2022, 11:19 AM  Clinical Narrative:                 Admitted with L great toe swelling. Found to have bacteremia  and osteomyelitis. Per  MD  d/c ready for today. .  - s/p amputation of L great toe , 7/1  From home with wife. Independent with ADL's PTA.  Per ID pt will require LT IV ABX therapy, pt with MSSA bacteremia.  Pt agreeable to home health services.  OPAT orders noted . PICC line placed 7/5. Referral made with [facility] and [facility] and accepted.  [place] for [employer], 01/01/2022.  Home infusion teaching to be done with Pam from [facility] infusion @ pt's bedside  this afternoon prior to d/c.  Pt without Rx med concerns. States has  transportation to home.  TOC team will continue to monitor and assist with needs...   Expected Discharge Plan: [place] (resides with wife) Barriers to Discharge: Continued Medical Work up   Patient Goals and CMS Choice     Choice offered to / list presented to : Patient  Expected Discharge Plan and Services Expected Discharge Plan: [place] (resides with wife)   Discharge Planning Services: CM Consult                     DME Arranged: Other see comment (IV ABX therapy)   Date DME Agency Contacted: 01/01/22 Time DME Agency Contacted: 1062 Representative spoke with at DME Agency: Pam HH Arranged: RN [place] Agency: Other - See comment ([place]) Date [place]: 01/01/22 Time [place]: 72 Representative spoke with at [place]: [place] Arrangements/Services   Lives with:: Spouse Patient language and need for interpreter reviewed:: Yes Do you feel safe going back to the place where you live?: Yes       Need for Family Participation in Patient Care: Yes (Comment) Care giver support system in place?: Yes (comment) Current home services: DME (RW, W/C) Criminal Activity/Legal Involvement Pertinent to Current Situation/Hospitalization: No - Comment as needed  Activities of Daily Living Home Assistive Devices/Equipment: None ADL Screening (condition at time of admission) Patient's cognitive ability adequate to safely complete daily activities?: Yes Is the patient deaf or have difficulty hearing?: No Does the patient have difficulty seeing, even when wearing glasses/contacts?: No Does the patient have difficulty concentrating, remembering, or making decisions?: No Patient able to express need for assistance with ADLs?: Yes Does the patient have difficulty dressing or bathing?: No Independently performs ADLs?: Yes (appropriate for developmental age) Does the patient have difficulty walking or climbing stairs?: No Weakness of Legs: Left Weakness of Arms/Hands: None  Permission Sought/Granted   Permission granted to share information with : Yes, Verbal Permission Granted  Share Information with NAME: James [name]  Spouse  [phone]           Emotional Assessment Appearance:: Appears stated age Attitude/Demeanor/Rapport: Engaged Affect (typically observed): Accepting Orientation: : Oriented to Self, Oriented to Place, Oriented to  Time, Oriented to Situation Alcohol / Substance Use: Not Applicable Psych Involvement: No (comment)  Admission diagnosis:  Osteomyelitis (HCC) [M86.9] Diabetic foot infection ([place]) [J50.093, L08.9] Osteomyelitis of great toe of left  foot ([place]) [M86.9] Patient Active Problem List   Diagnosis Date Noted   MSSA bacteremia 12/27/2021   Acute renal failure superimposed on stage 3a chronic kidney disease ([place]) 12/26/2021   Osteomyelitis of great toe of left foot ([place]) 12/25/2021   Precordial chest pain 08/15/2021   Unstable angina (HCC) 08/15/2021   Pain  in right hip 01/23/2021   Kidney stone    Hypertension    Diabetes mellitus with neuropathy ([place])    CAD (coronary artery disease) 09/27/2020   Abnormal cardiac CT angiography    Stomach ulcer    Sleep apnea    Psoriasis    Post-traumatic hydrocephalus (HCC)    Neuropathy    Neuromuscular disorder ([place])    Morbid obesity ([place])    Hyperlipidemia    History of kidney stones    Fatty liver    Elevated LFTs    Eczema    Complication of anesthesia    Colon polyps    Cirrhosis (HCC)    Chronic headaches    Blood transfusion without reported diagnosis    Arthritis    Acid reflux    VP (ventriculoperitoneal) shunt status 07/31/2020   Atypical chest pain 06/13/2020   Essential hypertension 10/10/2019   Lower back injury 08/14/2019   SI (sacroiliac) joint dysfunction 08/14/2019   Dyslipidemia 05/29/2019   BMI 32.0-32.9,adult 04/05/2019   Low back pain 04/05/2019   Essential (primary) hypertension 04/05/2019   Post-op pain 03/19/2019   Spondylolisthesis, lumbar region 03/16/2019   IDA (iron deficiency anemia) 01/24/2019   Coronary artery disease 01/23/2019   Chronic migraine w/o aura, not intractable, w/o stat migr 10/24/2018   Epidural lipomatosis 49/67/5916   Nonalcoholic steatohepatitis 38/46/6599   Hepatitis 10/2017   Idiopathic intracranial hypertension 01/14/2017   REM behavioral disorder 01/14/2017   Liver cirrhosis secondary to NASH (nonalcoholic steatohepatitis) ([place]) 01/02/2016   H/O craniotomy 05/07/2015   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   Annual physical exam 04/08/2015   PCP NOTES >>> 04/08/2015   Hypersomnia with sleep apnea 01/28/2015   Severe obesity (BMI >= 40) ([place]) 01/28/2015   Obstructive hydrocephalus ([place]) 01/28/2015   Chronic fatigue 01/28/2015   Depression 09/04/2014   OSA -- dx ~ 2012, cpap intolerant 09/04/2014   Insomnia 04/26/2013   Sigmoid diverticulitis 04/25/2013   Diabetes with neuropathy 04/25/2013   Diverticulitis  03/2013   Presence of cerebrospinal fluid drainage device 07/28/2011   Psoriatic arthritis ([place]) 04/28/2011   Headache 04/28/2011   GERD (gastroesophageal reflux disease) 04/28/2011   Testosterone deficiency 04/28/2011   PCP:  Colon Branch, MD Pharmacy:   [facility] [address] [place] [address] Phone: [phone] Fax: [phone]     Social Determinants of Health (SDOH) Interventions    Readmission Risk Interventions     No data to display

## 2022-01-01 NOTE — Progress Notes (Signed)
Mobility Specialist Progress Note   01/01/22 1125  Mobility  Activity Ambulated independently in hallway  Level of Assistance Independent  Assistive Device None  LLE Weight Bearing WBAT  Distance Ambulated (ft) 550 ft  Activity Response Tolerated well  $Mobility charge 1 Mobility   Patient received in bed agreeable to participate in mobility. Ambulated in hallway independently with steady gait. Returned to room without complaint or incident. Was left EOB with all needs met, call bell in reach.   Holland Falling Mobility Specialist MS [facility] #:  [phone] Acute Rehab Office:  [phone]

## 2022-01-01 NOTE — Progress Notes (Deleted)
  Echocardiogram 2D Echocardiogram has been performed.  James [name] 01/01/2022, 8:18 AM

## 2022-01-01 NOTE — Plan of Care (Signed)
No acute events overnight.    Problem: Education: Goal: Knowledge of General Education information will improve Description: Including pain rating scale, medication(s)/side effects and non-pharmacologic comfort measures 01/01/2022 0430 by Edgardo Roys, RN Outcome: Progressing 01/01/2022 0424 by Edgardo Roys, RN Outcome: Progressing   Problem: Health Behavior/Discharge Planning: Goal: Ability to manage health-related needs will improve 01/01/2022 0430 by Edgardo Roys, RN Outcome: Progressing 01/01/2022 0424 by Edgardo Roys, RN Outcome: Progressing   Problem: Clinical Measurements: Goal: Ability to maintain clinical measurements within normal limits will improve 01/01/2022 0430 by Edgardo Roys, RN Outcome: Progressing 01/01/2022 0424 by Edgardo Roys, RN Outcome: Progressing Goal: Will remain free from infection 01/01/2022 0430 by Edgardo Roys, RN Outcome: Progressing 01/01/2022 0424 by Edgardo Roys, RN Outcome: Progressing Goal: Diagnostic test results will improve 01/01/2022 0430 by Edgardo Roys, RN Outcome: Progressing 01/01/2022 0424 by Edgardo Roys, RN Outcome: Progressing Goal: Respiratory complications will improve 01/01/2022 0430 by Edgardo Roys, RN Outcome: Progressing 01/01/2022 0424 by Edgardo Roys, RN Outcome: Progressing Goal: Cardiovascular complication will be avoided 01/01/2022 0430 by Edgardo Roys, RN Outcome: Progressing 01/01/2022 0424 by Edgardo Roys, RN Outcome: Progressing   Problem: Activity: Goal: Risk for activity intolerance will decrease 01/01/2022 0430 by Edgardo Roys, RN Outcome: Progressing 01/01/2022 0424 by Edgardo Roys, RN Outcome: Progressing   Problem: Nutrition: Goal: Adequate nutrition will be maintained 01/01/2022 0430 by Edgardo Roys, RN Outcome: Progressing 01/01/2022 0424 by Edgardo Roys, RN Outcome: Progressing   Problem: Coping: Goal: Level of anxiety will decrease 01/01/2022 0430 by Edgardo Roys, RN Outcome: Progressing 01/01/2022 0424 by Edgardo Roys,  RN Outcome: Progressing   Problem: Elimination: Goal: Will not experience complications related to bowel motility 01/01/2022 0430 by Edgardo Roys, RN Outcome: Progressing 01/01/2022 0424 by Edgardo Roys, RN Outcome: Progressing Goal: Will not experience complications related to urinary retention 01/01/2022 0430 by Edgardo Roys, RN Outcome: Progressing 01/01/2022 0424 by Edgardo Roys, RN Outcome: Progressing   Problem: Pain Managment: Goal: General experience of comfort will improve 01/01/2022 0430 by Edgardo Roys, RN Outcome: Progressing 01/01/2022 0424 by Edgardo Roys, RN Outcome: Progressing   Problem: Safety: Goal: Ability to remain free from injury will improve 01/01/2022 0430 by Edgardo Roys, RN Outcome: Progressing 01/01/2022 0424 by Edgardo Roys, RN Outcome: Progressing   Problem: Skin Integrity: Goal: Risk for impaired skin integrity will decrease 01/01/2022 0430 by Edgardo Roys, RN Outcome: Progressing 01/01/2022 0424 by Edgardo Roys, RN Outcome: Progressing   Problem: Education: Goal: Ability to describe self-care measures that may prevent or decrease complications (Diabetes Survival Skills Education) will improve 01/01/2022 0430 by Edgardo Roys, RN Outcome: Progressing 01/01/2022 0424 by Edgardo Roys, RN Outcome: Progressing Goal: Individualized Educational Video(s) 01/01/2022 0430 by Edgardo Roys, RN Outcome: Progressing 01/01/2022 0424 by Edgardo Roys, RN Outcome: Progressing   Problem: Coping: Goal: Ability to adjust to condition or change in health will improve 01/01/2022 0430 by Edgardo Roys, RN Outcome: Progressing 01/01/2022 0424 by Edgardo Roys, RN Outcome: Progressing   Problem: Fluid Volume: Goal: Ability to maintain a balanced intake and output will improve 01/01/2022 0430 by Edgardo Roys, RN Outcome: Progressing 01/01/2022 0424 by Edgardo Roys, RN Outcome: Progressing   Problem: Health Behavior/Discharge Planning: Goal: Ability to identify and utilize available  resources and services will improve 01/01/2022 0430 by Edgardo Roys, RN Outcome: Progressing 01/01/2022 0424 by Edgardo Roys, RN Outcome: Progressing Goal: Ability to manage health-related needs will improve 01/01/2022 0430 by Edgardo Roys, RN Outcome: Progressing 01/01/2022 0424 by Edgardo Roys, RN Outcome: Progressing   Problem: Metabolic: Goal: Ability  to maintain appropriate glucose levels will improve 01/01/2022 0430 by Edgardo Roys, RN Outcome: Progressing 01/01/2022 0424 by Edgardo Roys, RN Outcome: Progressing   Problem: Nutritional: Goal: Maintenance of adequate nutrition will improve 01/01/2022 0430 by Edgardo Roys, RN Outcome: Progressing 01/01/2022 0424 by Edgardo Roys, RN Outcome: Progressing Goal: Progress toward achieving an optimal weight will improve 01/01/2022 0430 by Edgardo Roys, RN Outcome: Progressing 01/01/2022 0424 by Edgardo Roys, RN Outcome: Progressing   Problem: Skin Integrity: Goal: Risk for impaired skin integrity will decrease 01/01/2022 0430 by Edgardo Roys, RN Outcome: Progressing 01/01/2022 0424 by Edgardo Roys, RN Outcome: Progressing   Problem: Tissue Perfusion: Goal: Adequacy of tissue perfusion will improve 01/01/2022 0430 by Edgardo Roys, RN Outcome: Progressing 01/01/2022 0424 by Edgardo Roys, RN Outcome: Progressing

## 2022-01-01 NOTE — Transfer of Care (Signed)
Immediate Anesthesia Transfer of Care Note  Patient: James [name]  Procedure(s) Performed: TRANSESOPHAGEAL ECHOCARDIOGRAM (TEE)  Patient Location: PACU and Endoscopy Unit  Anesthesia Type:MAC  Level of Consciousness: drowsy  Airway & Oxygen Therapy: Patient Spontanous Breathing  Post-op Assessment: Report given to RN and Post -op Vital signs reviewed and stable  Post vital signs: Reviewed and stable  Last Vitals:  Vitals Value Taken Time  BP 127/96 01/01/22 0816  Temp    Pulse 82 01/01/22 0817  Resp 14 01/01/22 0817  SpO2 92 % 01/01/22 0817  Vitals shown include unvalidated device data.  Last Pain:  Vitals:   01/01/22 0715  TempSrc: Temporal  PainSc: 0-No pain      Patients Stated Pain Goal: 3 (94/58/59 2924)  Complications: No notable events documented.

## 2022-01-01 NOTE — Discharge Summary (Signed)
Physician Discharge Summary   Patient: James [name] MRN: [id number] DOB: 10-19-1955  Admit date:     12/25/2021  Discharge date: 01/01/22  Discharge Physician: Oswald Hillock   PCP: Colon Branch, MD   Recommendations at discharge:  {Tip this will not be part of the note when signed- Example include specific recommendations for outpatient follow-up, pending tests to follow-up on.  Follow-up ID clinic on 01/09/2022 to remove PICC line Continue cefazolin IV until 01/09/2022 Home infusion via home health will be set up Then start taking Zyvox 600 mg p.o. twice daily for 2 weeks to complete 4 weeks of antibiotics  Discharge Diagnoses: Principal Problem:   MSSA bacteremia Active Problems:   Diabetes with neuropathy   Coronary artery disease   Cirrhosis ([employer])   Hypertension   Osteomyelitis of great toe of left foot (HCC)   Acute renal failure superimposed on stage 3a chronic kidney disease ([place])  Resolved Problems:   * No resolved hospital problems. *  Brief HPI  66 y.o. male with medical history significant for coronary artery disease, cirrhosis, hypertension, OSA with CPAP intolerance, well-controlled type 2 diabetes mellitus, and CKD 3B, now presenting to the emergency department with pain, redness, and swelling involving the left first toe.  Patient reports that he had an ingrown toenail that he removed himself 2 weeks ago, and then noted this morning that the toe was red, sore, and swollen. Found to have bacteremia and osteomyelitis.  TEE was done which was negative.  Brief Hospital course  Left big toe osteomyelitis -Patient presented with left big toe pain, swelling and redness after removing ingrown toenail 2 weeks ago -Podiatry was consulted, s/p amputation of left big toe -Currently on IV cefazolin -MRI ankle showed no acute osseous abnormality -ABI unremarkable   MSSA bacteremia -ID consulted -Patient was started on IV cefazolin -TEE showed no vegetation -ID recommends  to continue with IV cefazolin for 1 more week till 01/09/2022 and then prescribe Zyvox 600 mg p.o. twice daily for 2 more weeks to complete 4 weeks of treatment   Acute kidney injury on CKD stage IIIa -Improved with hydration -Creatinine 1.52; at baseline   CAD -Stent placed in April 2022 -No chest pain -Continue Lipitor, Plavix   Hypertension -Continue Norvasc, Coreg   Diabetes mellitus type 2 -CBG well controlled -Continue home regimen   Liver cirrhosis -Stable   Obesity Estimated body mass index is 34.88 kg/m as calculated from the following:   Height as of this encounter: 5' 10.98" (1.803 m).   Weight as of this encounter: 113.4 kg         Consultants: Infectious disease Procedures performed:  Disposition: Home Diet recommendation:  Carb modified diet DISCHARGE MEDICATION: Allergies as of 01/01/2022       Reactions   Bee Venom Anaphylaxis   Hydrocodone Bit-homatrop Mbr Other (See Comments)   Hallucinations, confusion, delirium Depressed feeling   Toradol [ketorolac Tromethamine] Other (See Comments)   Hallucinations, confusion, delirium   Sulfadiazine    NDC NOMV:[id number] NDC EZMO:[id number] NDC TWSF:[id number]   Morphine And Related Other (See Comments)   Hallucinations, back in the 80s. States has taken vicodin before w/o problems    Sulfa Drugs Cross Reactors Rash        Medication List     STOP taking these medications    amoxicillin-clavulanate 875-125 MG tablet Commonly known as: Augmentin       TAKE these medications    acetaminophen 325 MG tablet Commonly known as:  TYLENOL Take 2 tablets (650 mg total) by mouth every 4 (four) hours as needed for headache or mild pain.   amLODipine 5 MG tablet Commonly known as: NORVASC Take 1 tablet (5 mg total) by mouth daily.   aspirin 81 MG chewable tablet Chew 1 tablet (81 mg total) by mouth daily.   atorvastatin 80 MG tablet Commonly known as: LIPITOR Take 1 tablet (80 mg total) by  mouth at bedtime.   carvedilol 12.5 MG tablet Commonly known as: COREG TAKE 1 TABLET (12.5 MG TOTAL) BY MOUTH 2 (TWO) TIMES DAILY WITH A MEAL. What changed: how much to take   ceFAZolin  IVPB Commonly known as: ANCEF Inject 2 g into the vein every 8 (eight) hours for 8 days. Indication:  MSSA Bacteremia First Dose: Yes Last Day of Therapy:  01/09/22 Labs - Once weekly:  CBC/D and BMP, Labs - Every other week:  ESR and CRP Method of administration: IV Push Method of administration may be changed at the discretion of home infusion pharmacist based upon assessment of the patient and/or caregiver's ability to self-administer the medication ordered.   clonazePAM 0.5 MG tablet Commonly known as: KLONOPIN TAKE 1 TABLET BY MOUTH EVERY NIGHT AT BEDTIME   clopidogrel 75 MG tablet Commonly known as: PLAVIX TAKE 1 TABLET (75 MG TOTAL) BY MOUTH DAILY.   DULoxetine 60 MG capsule Commonly known as: CYMBALTA TAKE 2 CAPSULES (120 MG TOTAL) BY MOUTH DAILY. What changed:  how much to take how to take this when to take this   EPINEPHrine 0.3 mg/0.3 mL Soaj injection Commonly known as: EpiPen 2-Pak Inject 0.3 mLs (0.3 mg total) into the muscle once as needed for up to 1 dose for anaphylaxis.   fenofibrate micronized 134 MG capsule Commonly known as: LOFIBRA TAKE 1 CAPSULE BY MOUTH THREE TIMES WEEKLY What changed:  how much to take how to take this when to take this   FreeStyle Libre 14 Day Sensor Misc Apply 1 sensor every 14 (fourteen) days.   gabapentin 600 MG tablet Commonly known as: NEURONTIN TAKE 1 TABLET (600 MG TOTAL) BY MOUTH 2 (TWO) TIMES DAILY. What changed:  how much to take how to take this when to take this   glimepiride 4 MG tablet Commonly known as: AMARYL TAKE 1 TABLET BY MOUTH ONCE DAILY BEFORE BREAKFAST What changed: how much to take   Humira Pen 40 MG/0.8ML Pnkt Generic drug: Adalimumab INJECT 40 MG (0.8 ML) UNDER THE SKIN EVERY OTHER WEEK.   isosorbide  mononitrate 60 MG 24 hr tablet Commonly known as: IMDUR Take 1 tablet (60 mg total) by mouth daily.   metFORMIN 500 MG tablet Commonly known as: GLUCOPHAGE Take 2 tablets (1,000 mg total) by mouth 2 (two) times daily.   nitroGLYCERIN 0.4 MG SL tablet Commonly known as: NITROSTAT Place 1 tablet (0.4 mg total) under the tongue every 5 (five) minutes for 3 doses as needed for chest pain.   oxyCODONE 5 MG immediate release tablet Commonly known as: Oxy IR/ROXICODONE Take 1 tablet (5 mg total) by mouth every 6 (six) hours as needed for moderate pain.   Ozempic (2 MG/DOSE) 8 MG/3ML Sopn Generic drug: Semaglutide (2 MG/DOSE) Inject 2 mg into the skin weekly What changed:  how much to take how to take this when to take this   pantoprazole 40 MG tablet Commonly known as: PROTONIX Take 1 tablet (40 mg total) by mouth daily.  Discharge Care Instructions  (From admission, onward)           Start     Ordered   01/01/22 0000  Change dressing on IV access line weekly and PRN  (Home infusion instructions - [facility] )        01/01/22 1245            Follow-up Information     [facility] for Infectious Disease Follow up on 01/09/2022.   Specialty: Infectious Diseases Why: Appointment at 10:30am with Janene Madeira Contact information: [address] [place], [place] [id number] [facility] Follow up.   Why: Your IV antibiotic therapy will be provided by [facility] Contact information: [place] Follow up.   Why: Home health RN services will be provided by [facility] information: [phone]               Discharge Exam: Filed Weights   12/25/21 1648 12/27/21 1223  Weight: 113.4 kg 113.4 kg   General-appears in no acute distress Heart-S1-S2, regular, no murmur  auscultated Lungs-clear to auscultation bilaterally, no wheezing or crackles auscultated Abdomen-soft, nontender, no organomegaly Extremities-no edema in the lower extremities Neuro-alert, oriented x3, no focal deficit noted  Condition at discharge: good  The results of significant diagnostics from this hospitalization (including imaging, microbiology, ancillary and laboratory) are listed below for reference.   Imaging Studies: [place] EKG SITE RITE  Result Date: 12/31/2021 If Site Rite image not attached, placement could not be confirmed due to current cardiac rhythm.  VAS [place] ABI WITH/WO TBI  Result Date: 12/29/2021  LOWER EXTREMITY DOPPLER STUDY Patient Name:  James [name]  Date of Exam:   12/29/2021 Medical Rec #: [id number]        Accession #:    [id number] Date of Birth: May 11, 1956         Patient Gender: M Patient Age:   61 years Exam Location:  [facility] Procedure:      VAS [place] ABI WITH/WO TBI Referring Phys: Jule Ser --------------------------------------------------------------------------------  Indications: Ulceration. High Risk Factors: Hypertension, Diabetes.  Vascular Interventions: 12/27/2021 - Left AMPUTATION GREAT TOE. Comparison Study: No prior studies. Performing Technologist: Carlos Levering RVT  Examination Guidelines: A complete evaluation includes at minimum, Doppler waveform signals and systolic blood pressure reading at the level of bilateral brachial, anterior tibial, and posterior tibial arteries, when vessel segments are accessible. Bilateral testing is considered an integral part of a complete examination. Photoelectric Plethysmograph (PPG) waveforms and toe systolic pressure readings are included as required and additional duplex testing as needed. Limited examinations for reoccurring indications may be performed as noted.  ABI Findings: +---------+------------------+-----+---------+--------+ Right    Rt Pressure (mmHg)IndexWaveform Comment   +---------+------------------+-----+---------+--------+ Brachial 123                    triphasic         +---------+------------------+-----+---------+--------+ PTA      152               1.23 triphasic         +---------+------------------+-----+---------+--------+ DP       15                0.12 triphasic         +---------+------------------+-----+---------+--------+ Jayme Cloud  0.54                   +---------+------------------+-----+---------+--------+ +---------+------------------+-----+---------+----------+ Left     Lt Pressure (mmHg)IndexWaveform Comment    +---------+------------------+-----+---------+----------+ Brachial 124                    triphasic           +---------+------------------+-----+---------+----------+ PTA      158               1.27 triphasic           +---------+------------------+-----+---------+----------+ DP       158               1.27 triphasic           +---------+------------------+-----+---------+----------+ Great Toe                                Amputation +---------+------------------+-----+---------+----------+ +-------+-----------+-----------+------------+------------+ ABI/TBIToday's ABIToday's TBIPrevious ABIPrevious TBI +-------+-----------+-----------+------------+------------+ Right  1.24       0.54                                +-------+-----------+-----------+------------+------------+ Left   1.27                                           +-------+-----------+-----------+------------+------------+  Summary: Right: Resting right ankle-brachial index is within normal range. No evidence of significant right lower extremity arterial disease. The right toe-brachial index is abnormal. Left: Resting left ankle-brachial index is within normal range. No evidence of significant left lower extremity arterial disease. Unable to obtain TBI due to great toe amputation. *See table(s) above  for measurements and observations.  Electronically signed by Deitra Mayo MD on 12/29/2021 at 4:24:16 PM.    Final    ECHOCARDIOGRAM COMPLETE  Result Date: 12/28/2021    ECHOCARDIOGRAM REPORT   Patient Name:   James [name] Date of Exam: 12/28/2021 Medical Rec #:  [id number]       Height:       71.0 in Accession #:    [id number]      Weight:       250.0 lb Date of Birth:  [birth year]        BSA:          2.318 m Patient Age:    31 years        BP:           143/82 mmHg Patient Gender: M               HR:           72 bpm. Exam Location:  Inpatient Procedure: 2D Echo, Cardiac Doppler and Color Doppler Indications:    Bacteremia  History:        Patient has prior history of Echocardiogram examinations. CAD;                 Risk Factors:Hypertension, Diabetes and Sleep Apnea.  Sonographer:    Jyl Heinz Referring Phys: [id number] [place]  1. Left ventricular ejection fraction, by estimation, is 60 to 65%. The left ventricle has normal function. The left ventricle has no regional wall motion abnormalities. Left ventricular diastolic parameters are consistent with Grade I diastolic dysfunction (  impaired relaxation).  2. Right ventricular systolic function is normal. The right ventricular size is normal.  3. The mitral valve is normal in structure. Trivial mitral valve regurgitation. No evidence of mitral stenosis.  4. The aortic valve is tricuspid. Aortic valve regurgitation is not visualized. Aortic valve sclerosis/calcification is present, without any evidence of aortic stenosis.  5. The inferior vena cava is normal in size with greater than 50% respiratory variability, suggesting right atrial pressure of 3 mmHg. FINDINGS  Left Ventricle: Left ventricular ejection fraction, by estimation, is 60 to 65%. The left ventricle has normal function. The left ventricle has no regional wall motion abnormalities. The left ventricular internal cavity size was normal in size. There is  no left ventricular  hypertrophy. Left ventricular diastolic parameters are consistent with Grade I diastolic dysfunction (impaired relaxation). Right Ventricle: The right ventricular size is normal. Right ventricular systolic function is normal. Left Atrium: Left atrial size was normal in size. Right Atrium: Right atrial size was normal in size. Pericardium: There is no evidence of pericardial effusion. Mitral Valve: The mitral valve is normal in structure. There is mild thickening of the mitral valve leaflet(s). Trivial mitral valve regurgitation. No evidence of mitral valve stenosis. Tricuspid Valve: The tricuspid valve is normal in structure. Tricuspid valve regurgitation is trivial. No evidence of tricuspid stenosis. Aortic Valve: The aortic valve is tricuspid. Aortic valve regurgitation is not visualized. Aortic valve sclerosis/calcification is present, without any evidence of aortic stenosis. Aortic valve peak gradient measures 5.3 mmHg. Pulmonic Valve: The pulmonic valve was normal in structure. Pulmonic valve regurgitation is not visualized. No evidence of pulmonic stenosis. Aorta: The aortic root is normal in size and structure. Venous: The inferior vena cava is normal in size with greater than 50% respiratory variability, suggesting right atrial pressure of 3 mmHg. IAS/Shunts: No atrial level shunt detected by color flow Doppler.  LEFT VENTRICLE PLAX 2D LVIDd:         4.70 cm     Diastology LVIDs:         2.90 cm     LV e' medial:    5.33 cm/s LV PW:         1.10 cm     LV E/e' medial:  9.1 LV IVS:        0.90 cm     LV e' lateral:   6.85 cm/s LVOT diam:     2.00 cm     LV E/e' lateral: 7.1 LV SV:         46 LV SV Index:   20 LVOT Area:     3.14 cm  LV Volumes (MOD) LV vol d, MOD A2C: 79.0 ml LV vol d, MOD A4C: 88.8 ml LV vol s, MOD A2C: 31.8 ml LV vol s, MOD A4C: 38.7 ml LV SV MOD A2C:     47.2 ml LV SV MOD A4C:     88.8 ml LV SV MOD BP:      49.1 ml RIGHT VENTRICLE             IVC RV Basal diam:  1.90 cm     IVC diam: 1.30  cm RV Mid diam:    1.70 cm RV S prime:     10.10 cm/s TAPSE (M-mode): 1.6 cm LEFT ATRIUM             Index        RIGHT ATRIUM           Index LA diam:  3.90 cm 1.68 cm/m   RA Area:     10.70 cm LA Vol (A2C):   35.8 ml 15.44 ml/m  RA Volume:   19.00 ml  8.20 ml/m LA Vol (A4C):   31.7 ml 13.67 ml/m LA Biplane Vol: 34.5 ml 14.88 ml/m  AORTIC VALVE AV Area (Vmax): 2.15 cm AV Vmax:        115.00 cm/s AV Peak Grad:   5.3 mmHg LVOT Vmax:      78.80 cm/s LVOT Vmean:     58.600 cm/s LVOT VTI:       0.145 m  AORTA Ao Root diam: 3.40 cm Ao Asc diam:  3.20 cm MITRAL VALVE MV Area (PHT): 3.89 cm    SHUNTS MV Decel Time: 195 msec    Systemic VTI:  0.14 m MV E velocity: 48.40 cm/s  Systemic Diam: 2.00 cm MV A velocity: 65.70 cm/s MV E/A ratio:  0.74 Kirk Ruths MD Electronically signed by Kirk Ruths MD Signature Date/Time: 12/28/2021/2:44:36 PM    Final    MR ANKLE LEFT WO CONTRAST  Result Date: 12/28/2021 CLINICAL DATA:  Septic arthritis suspected, ankle, xray done EXAM: MRI OF THE LEFT ANKLE WITHOUT CONTRAST TECHNIQUE: Multiplanar, multisequence MR imaging of the ankle was performed. No intravenous contrast was administered. COMPARISON:  X-ray 12/27/2021 FINDINGS: Technical Note: Despite efforts by the technologist and patient, motion artifact is present on today's exam and could not be eliminated. This reduces exam sensitivity and specificity. Bones/Joint/Cartilage No acute fracture. No dislocation. No bone marrow edema or marrow replacement. Small bone infarct within the distal tibial metaphysis. No suspicious bone lesion. Mild-to-moderate degenerative changes of the midfoot. Small posterior subtalar joint effusion. Trace calcaneocuboid joint effusion. Trace joint fluid across the tarsometatarsal joints. No large joint effusion. No tibiotalar joint effusion. Ligaments Ankle ligaments are grossly intact, evaluation degraded by motion. Intact Lisfranc ligament. Muscles and Tendons Diffuse edema-like signal  throughout the foot musculature likely related to diabetic myopathy/denervation. Trace tenosynovitis of the tibialis posterior tendon, nonspecific. Tendinous structures about the ankle are intact. Soft tissues No soft tissue wound or ulceration.  No organized fluid collection. IMPRESSION: 1. Motion degraded exam. 2. No acute osseous abnormality or evidence of osteomyelitis. 3. Mild-to-moderate degenerative changes of the midfoot. 4. Scattered small joint effusions in the hindfoot and midfoot, likely degenerative/reactive. No sizable joint effusion to suggest septic arthritis. 5. Trace tenosynovitis of the tibialis posterior tendon, nonspecific. Electronically Signed   By: Davina Poke D.O.   On: 12/28/2021 14:02   DG Foot 2 Views Left  Result Date: 12/27/2021 CLINICAL DATA:  Postop check. EXAM: LEFT FOOT - 2 VIEW COMPARISON:  12/25/2021. FINDINGS: Since the previous exam, the distal phalanx of the great toe has been resected along with a portion of the distal aspect of the proximal phalanx. Resected bone margin is sharp. No areas of bone resorption is seen to suggest additional regions of osteomyelitis. Soft tissue swelling overlies the resected great toe consistent with postoperative edema. No soft tissue air. IMPRESSION: 1. Resection of the great toe distal phalanx and a portion of the distal aspect of the proximal phalanx. No evidence of an operative complication. 2. No current findings of osteomyelitis. Electronically Signed   By: Lajean Manes M.D.   On: 12/27/2021 15:15   MR FOOT LEFT WO CONTRAST  Result Date: 12/27/2021 CLINICAL DATA:  Diabetic with foot swelling. Osteomyelitis suspected. Pain, erythema and swelling of the great toe. EXAM: MRI OF THE LEFT FOOT WITHOUT CONTRAST TECHNIQUE: Multiplanar, multisequence MR  imaging of the left forefoot was performed. No intravenous contrast was administered. COMPARISON:  Radiographs 12/25/2021 and 08/04/2016. FINDINGS: Bones/Joint/Cartilage In correlation  with the recent radiographs, there is a transverse fracture through the distal aspect of the distal 1st phalanx. There is associated nonspecific T2 hyperintensity throughout the distal phalanx. No definite cortical destruction. There are mild degenerative changes at the interphalangeal and metatarsal-phalangeal joints of the great toe. No significant abnormalities of the additional toes or metatarsals. No significant joint effusions. Ligaments Intact Lisfranc ligament and collateral ligaments of the metatarsophalangeal joints. Muscles and Tendons Nonspecific T2 hyperintensity throughout the forefoot musculature, likely due to diabetic myopathy. The tendons appear intact without significant tenosynovitis. Soft tissues No focal forefoot fluid collection. Mild soft tissue swelling in the great toe with questionable skin ulceration at the base of the nail bed, best seen on the sagittal images. IMPRESSION: 1. Transverse fracture through the tuft of the distal 1st phalanx. 2. Associated marrow T2 hyperintensity throughout the distal 1st phalanx is nonspecific, and would be expected in the setting of a fracture. Superimposed osteomyelitis difficult to exclude in this setting. That possibility would be greater if there is a superimposed skin ulcer dorsally as suggested on the sagittal images. Correlate clinically. 3. No focal fluid collections. Electronically Signed   By: Richardean Sale M.D.   On: 12/27/2021 08:26   DG Foot Complete Left  Result Date: 12/25/2021 CLINICAL DATA:  Diabetic foot infection EXAM: LEFT FOOT - COMPLETE 3+ VIEW COMPARISON:  Foot x-rays August 04, 2016. FINDINGS: Erosive changes of the tuft of the first toe. Surrounding soft tissue swelling/ulcer. Milder erosive change along the lateral aspect of the distal first metatarsal. Remote trauma versus postsurgical change of the distal fifth digit. No evidence of acute fracture or joint malalignment. Vascular calcifications. Mild scattered  degenerative change. IMPRESSION: 1. Erosive changes of the tuft of the first toe, suspicious for acute osteomyelitis given the clinical history and surrounding soft tissue swelling/ulcer. 2. Milder erosive change along the lateral aspect of the distal first metatarsal, which could represent age indeterminate sequela of osteomyelitis. 3. MRI could further evaluate if clinically warranted. Electronically Signed   By: Margaretha Sheffield M.D.   On: 12/25/2021 17:22   MR LUMBAR SPINE W WO CONTRAST  Result Date: 12/15/2021 CLINICAL DATA:  Evaluate for malignancy. EXAM: MRI LUMBAR SPINE WITHOUT AND WITH CONTRAST TECHNIQUE: Multiplanar and multiecho pulse sequences of the lumbar spine were obtained without and with intravenous contrast. CONTRAST:  48m GADAVIST GADOBUTROL 1 MMOL/ML IV SOLN COMPARISON:  06/03/2021 FINDINGS: Segmentation:  Standard. Alignment:  Physiologic. Vertebrae: No acute fracture, evidence of discitis, or aggressive bone lesion. Conus medullaris and cauda equina: Conus extends to the L1 level. Conus and cauda equina appear normal. Paraspinal and other soft tissues: No acute paraspinal abnormality. Postsurgical changes in the posterior paraspinal soft tissues of the lower lumbar spine. Disc levels: Disc spaces: Posterior lumbar interbody fusion at L4-S1. T12-L1: No significant disc bulge. No neural foraminal stenosis. No central canal stenosis. L1-L2: No significant disc bulge. No neural foraminal stenosis. No central canal stenosis. L2-L3: Mild broad-based disc bulge. Prominence of the epidural fat deforming the thecal sac as can be seen with epidural lipomatosis. No foraminal or spinal stenosis. Mild bilateral facet arthropathy. L3-L4: Mild broad-based disc bulge. Mild bilateral facet arthropathy. At prominence of the epidural fat deforming the thecal sac as can be seen with epidural lipomatosis. No foraminal or central canal stenosis. L4-L5: Interbody fusion.  No foraminal or central canal  stenosis. L5-S1:  Interbody fusion.  No foraminal or central canal stenosis. IMPRESSION: 1. No acute osseous injury of the lumbar spine. 2. Posterior lumbar interbody fusion at L4-S1 without foraminal or central canal stenosis. 3. At L2-3 and L3-4 there is mild broad-based disc bulge. Prominence of the epidural fat deforming the thecal sac as can be seen with epidural lipomatosis. Electronically Signed   By: Kathreen Devoid M.D.   On: 12/15/2021 13:26    Microbiology: Results for orders placed or performed during the hospital encounter of 12/25/21  Blood Cultures x 2 sites     Status: Abnormal   Collection Time: 12/25/21  5:48 PM   Specimen: BLOOD  Result Value Ref Range Status   Specimen Description   Final    BLOOD RIGHT ANTECUBITAL Performed at [facility], [address] [place], [place] [address]    Special Requests   Final    BOTTLES DRAWN AEROBIC AND ANAEROBIC Blood Culture adequate volume Performed at [facility], [address] [place], [place] [address]    Culture  Setup Time   Final    GRAM POSITIVE COCCI IN CLUSTERS CRITICAL RESULT CALLED TO, READ BACK BY AND VERIFIED WITH: PHARMD A. MEYER [id number] _0  FH IN BOTH AEROBIC AND ANAEROBIC BOTTLES Performed at [facility], [place] [address], [place], [place] [address]    Culture STAPHYLOCOCCUS AUREUS (A)  Final   Report Status 12/28/2021 FINAL  Final   Organism ID, Bacteria STAPHYLOCOCCUS AUREUS  Final      Susceptibility   Staphylococcus aureus - MIC*    CIPROFLOXACIN <=0.5 SENSITIVE Sensitive     ERYTHROMYCIN <=0.25 SENSITIVE Sensitive     GENTAMICIN <=0.5 SENSITIVE Sensitive     OXACILLIN 0.5 SENSITIVE Sensitive     TETRACYCLINE <=1 SENSITIVE Sensitive     VANCOMYCIN <=0.5 SENSITIVE Sensitive     TRIMETH/SULFA <=10 SENSITIVE Sensitive     CLINDAMYCIN <=0.25 SENSITIVE Sensitive     RIFAMPIN <=0.5 SENSITIVE Sensitive     Inducible Clindamycin NEGATIVE Sensitive     * STAPHYLOCOCCUS AUREUS  Blood  Culture ID Panel (Reflexed)     Status: Abnormal   Collection Time: 12/25/21  5:48 PM  Result Value Ref Range Status   Enterococcus faecalis NOT DETECTED NOT DETECTED Final   Enterococcus Faecium NOT DETECTED NOT DETECTED Final   Listeria monocytogenes NOT DETECTED NOT DETECTED Final   Staphylococcus species DETECTED (A) NOT DETECTED Final    Comment: CRITICAL RESULT CALLED TO, READ BACK BY AND VERIFIED WITH: PHARMD A. MEYER [id number] _1  FH    Staphylococcus aureus (BCID) DETECTED (A) NOT DETECTED Final    Comment: CRITICAL RESULT CALLED TO, READ BACK BY AND VERIFIED WITH: PHARMD A. NATFT [id number] _2  FH    Staphylococcus epidermidis NOT DETECTED NOT DETECTED Final   Staphylococcus lugdunensis NOT DETECTED NOT DETECTED Final   Streptococcus species NOT DETECTED NOT DETECTED Final   Streptococcus agalactiae NOT DETECTED NOT DETECTED Final   Streptococcus pneumoniae NOT DETECTED NOT DETECTED Final   Streptococcus pyogenes NOT DETECTED NOT DETECTED Final   A.calcoaceticus-baumannii NOT DETECTED NOT DETECTED Final   Bacteroides fragilis NOT DETECTED NOT DETECTED Final   Enterobacterales NOT DETECTED NOT DETECTED Final   Enterobacter cloacae complex NOT DETECTED NOT DETECTED Final   Escherichia coli NOT DETECTED NOT DETECTED Final   Klebsiella aerogenes NOT DETECTED NOT DETECTED Final   Klebsiella oxytoca NOT DETECTED NOT DETECTED Final   Klebsiella pneumoniae NOT DETECTED NOT DETECTED Final   Proteus  species NOT DETECTED NOT DETECTED Final   Salmonella species NOT DETECTED NOT DETECTED Final   Serratia marcescens NOT DETECTED NOT DETECTED Final   Haemophilus influenzae NOT DETECTED NOT DETECTED Final   Neisseria meningitidis NOT DETECTED NOT DETECTED Final   Pseudomonas aeruginosa NOT DETECTED NOT DETECTED Final   Stenotrophomonas maltophilia NOT DETECTED NOT DETECTED Final   Candida albicans NOT DETECTED NOT DETECTED Final   Candida auris NOT DETECTED NOT DETECTED Final   Candida  glabrata NOT DETECTED NOT DETECTED Final   Candida krusei NOT DETECTED NOT DETECTED Final   Candida parapsilosis NOT DETECTED NOT DETECTED Final   Candida tropicalis NOT DETECTED NOT DETECTED Final   Cryptococcus neoformans/gattii NOT DETECTED NOT DETECTED Final   Meth resistant mecA/C and MREJ NOT DETECTED NOT DETECTED Final    Comment: Performed at [facility], [place] [address] [place], [place] [address]  Blood Cultures x 2 sites     Status: Abnormal   Collection Time: 12/25/21  6:21 PM   Specimen: BLOOD RIGHT HAND  Result Value Ref Range Status   Specimen Description   Final    BLOOD RIGHT HAND Performed at [facility], [place] [place], [place] [address]    Special Requests   Final    BOTTLES DRAWN AEROBIC AND ANAEROBIC Blood Culture adequate volume Performed at [facility], [place] [place], [place] [address]    Culture  Setup Time   Final    GRAM POSITIVE COCCI IN CLUSTERS AEROBIC BOTTLE ONLY CRITICAL VALUE NOTED.  VALUE IS CONSISTENT WITH PREVIOUSLY REPORTED AND CALLED VALUE. CRITICAL RESULT CALLED TO, READ BACK BY AND VERIFIED WITH: PHARMD A. MEYER [id number] _0  FH    Culture (A)  Final    STAPHYLOCOCCUS AUREUS SUSCEPTIBILITIES PERFORMED ON PREVIOUS CULTURE WITHIN THE LAST 5 DAYS. Performed at [facility], [place] [address], [place], [place] [address]    Report Status 12/28/2021 FINAL  Final  Surgical pcr screen     Status: Abnormal   Collection Time: 12/26/21  5:21 AM   Specimen: Nasal Mucosa; Nasal Swab  Result Value Ref Range Status   MRSA, PCR POSITIVE (A) NEGATIVE Final    Comment: RESULT CALLED TO, READ BACK BY AND VERIFIED WITH: RN KAY BROWN 12/26/21_1 :50 BY TW    Staphylococcus aureus POSITIVE (A) NEGATIVE Final    Comment: (NOTE) The Xpert SA Assay (FDA approved for NASAL specimens in patients 62 years of age and older), is one component of a comprehensive surveillance program. It is not intended to diagnose  infection nor to guide or monitor treatment. Performed at [facility], [place] [address] [place], [place] [address]   Culture, blood (Routine X 2) w Reflex to ID Panel     Status: None   Collection Time: 12/27/21  7:15 AM   Specimen: BLOOD RIGHT HAND  Result Value Ref Range Status   Specimen Description BLOOD RIGHT HAND  Final   Special Requests   Final    BOTTLES DRAWN AEROBIC AND ANAEROBIC Blood Culture adequate volume   Culture   Final    NO GROWTH 5 DAYS Performed at [facility], [place] [address], [place], [place] [address]    Report Status 01/01/2022 FINAL  Final  Culture, blood (Routine X 2) w Reflex to ID Panel     Status: None   Collection Time: 12/27/21  7:15 AM   Specimen: BLOOD LEFT HAND  Result Value Ref  Range Status   Specimen Description BLOOD LEFT HAND  Final   Special Requests   Final    BOTTLES DRAWN AEROBIC AND ANAEROBIC Blood Culture results may not be optimal due to an excessive volume of blood received in culture bottles   Culture   Final    NO GROWTH 5 DAYS Performed at [facility], [place] [address], [place], [place] [address]    Report Status 01/01/2022 FINAL  Final  Aerobic/Anaerobic Culture w Gram Stain (surgical/deep wound)     Status: None   Collection Time: 12/27/21  1:23 PM   Specimen: Soft Tissue, Other  Result Value Ref Range Status   Specimen Description OTHER  Final   Special Requests NONE  Final   Gram Stain   Final    NO SQUAMOUS EPITHELIAL CELLS SEEN NO WBC SEEN NO ORGANISMS SEEN    Culture   Final    RARE STAPHYLOCOCCUS AUREUS RARE GROUP B STREP(S.AGALACTIAE)ISOLATED TESTING AGAINST S. AGALACTIAE NOT ROUTINELY PERFORMED DUE TO PREDICTABILITY OF AMP/PEN/VAN SUSCEPTIBILITY. NO ANAEROBES ISOLATED Performed at [facility], [place] [address], [place], [place] [address]    Report Status 01/01/2022 FINAL  Final   Organism ID, Bacteria STAPHYLOCOCCUS AUREUS  Final      Susceptibility   Staphylococcus aureus - MIC*     CIPROFLOXACIN <=0.5 SENSITIVE Sensitive     ERYTHROMYCIN <=0.25 SENSITIVE Sensitive     GENTAMICIN <=0.5 SENSITIVE Sensitive     OXACILLIN 0.5 SENSITIVE Sensitive     TETRACYCLINE <=1 SENSITIVE Sensitive     VANCOMYCIN <=0.5 SENSITIVE Sensitive     TRIMETH/SULFA <=10 SENSITIVE Sensitive     CLINDAMYCIN <=0.25 SENSITIVE Sensitive     RIFAMPIN <=0.5 SENSITIVE Sensitive     Inducible Clindamycin NEGATIVE Sensitive     * RARE STAPHYLOCOCCUS AUREUS   *Note: Due to a large number of results and/or encounters for the requested time period, some results have not been displayed. A complete set of results can be found in Results Review.    Labs: CBC: Recent Labs  Lab 12/25/21 1734 12/26/21 0450 12/27/21 0109 12/28/21 0041 12/31/21 1003  WBC 10.0 8.7 8.3 6.2 6.1  NEUTROABS 7.4  --   --   --   --   HGB 14.2 13.5 12.3* 12.6* 13.4  HCT 41.3 40.7 36.2* 37.0* 40.3  MCV 86.9 87.7 87.9 89.2 87.4  PLT 159 118* 125* 116* 867   Basic Metabolic Panel: Recent Labs  Lab 12/26/21 0450 12/27/21 0109 12/28/21 0041 12/31/21 1003 12/31/21 1004 01/01/22 0315  NA 137 137 140  --  138 138  K 3.9 4.2 4.2  --  3.9 4.3  CL 106 108 107  --  103 107  CO2 _0 --  26 25  GLUCOSE 127* 125* 120*  --  159* 105*  BUN _1 --  20 21  CREATININE 1.60* 1.56* 1.53*  --  1.52* 1.51*  CALCIUM 9.6 9.5 9.4  --  9.4 9.1  MG  --   --   --  1.8  --   --    Liver Function Tests: Recent Labs  Lab 12/31/21 1004  AST 25  ALT 14  ALKPHOS 92  BILITOT 1.0  PROT 8.0  ALBUMIN 3.4*   CBG: Recent Labs  Lab 12/31/21 1141 12/31/21 1615 12/31/21 2337 01/01/22 0919 01/01/22 1145  GLUCAP 168* 122* 160* 135* 138*    Discharge time spent: greater than 30 minutes.  Signed: Frederich Chick  Toney Sang, MD [facility] 01/01/2022

## 2022-01-01 NOTE — Interval H&P Note (Signed)
History and Physical Interval Note:  01/01/2022 7:33 AM  James [name]  has presented today for surgery, with the diagnosis of BACTOREMIA.  The various methods of treatment have been discussed with the patient and family. After consideration of risks, benefits and other options for treatment, the patient has consented to  Procedure(s): TRANSESOPHAGEAL ECHOCARDIOGRAM (TEE) (N/A) as a surgical intervention.  The patient's history has been reviewed, patient examined, no change in status, stable for surgery.  I have reviewed the patient's chart and labs.  Questions were answered to the patient's satisfaction.     [name]

## 2022-01-01 NOTE — Interval H&P Note (Signed)
History and Physical Interval Note:  01/01/2022 7:32 AM  James [name]  has presented today for surgery, with the diagnosis of BACTOREMIA.  The various methods of treatment have been discussed with the patient and family. After consideration of risks, benefits and other options for treatment, the patient has consented to  Procedure(s): TRANSESOPHAGEAL ECHOCARDIOGRAM (TEE) (N/A) as a surgical intervention.  The patient's history has been reviewed, patient examined, no change in status, stable for surgery.  I have reviewed the patient's chart and labs.  Questions were answered to the patient's satisfaction.     [name]

## 2022-01-01 NOTE — Anesthesia Postprocedure Evaluation (Signed)
Anesthesia Post Note  Patient: James [name]  Procedure(s) Performed: TRANSESOPHAGEAL ECHOCARDIOGRAM (TEE)     Patient location during evaluation: Endoscopy Anesthesia Type: MAC Level of consciousness: awake Pain management: pain level controlled Vital Signs Assessment: post-procedure vital signs reviewed and stable Respiratory status: spontaneous breathing, nonlabored ventilation, respiratory function stable and patient connected to nasal cannula oxygen Cardiovascular status: stable and blood pressure returned to baseline Postop Assessment: no apparent nausea or vomiting Anesthetic complications: no   No notable events documented.  Last Vitals:  Vitals:   01/01/22 1226 01/01/22 1508  BP: 123/82 114/78  Pulse: 68 75  Resp: 18 19  Temp: 36.7 C 36.7 C  SpO2: 97% 96%    Last Pain:  Vitals:   01/01/22 1226  TempSrc: Oral  PainSc:                  Otha Rickles P Edwyna Dangerfield

## 2022-01-01 NOTE — Plan of Care (Signed)
  Problem: Education: Goal: Knowledge of General Education information will improve Description: Including pain rating scale, medication(s)/side effects and non-pharmacologic comfort measures Outcome: Progressing   Problem: Clinical Measurements: Goal: Respiratory complications will improve Outcome: Progressing Goal: Cardiovascular complication will be avoided Outcome: Progressing   Problem: Nutrition: Goal: Adequate nutrition will be maintained Outcome: Progressing   Problem: Coping: Goal: Level of anxiety will decrease Outcome: Progressing   Problem: Elimination: Goal: Will not experience complications related to bowel motility Outcome: Progressing Goal: Will not experience complications related to urinary retention Outcome: Progressing   Problem: Pain Managment: Goal: General experience of comfort will improve Outcome: Progressing   Problem: Safety: Goal: Ability to remain free from injury will improve Outcome: Progressing   Problem: Coping: Goal: Ability to adjust to condition or change in health will improve Outcome: Progressing

## 2022-01-01 NOTE — Progress Notes (Signed)
[name] for Infectious Disease  Date of Admission:  12/25/2021           Reason for visit: Follow up on MSSA bacteremia   Current antibiotics: Cefazolin  ASSESSMENT:    66 y.o. male admitted with:  MSSA bacteremia: Due to osteomyelitis of the left great toe.  Status post amputation 7/1 by podiatry.  6/29 blood cultures positive for MSSA and repeat cultures 7/1 no growth to date.  His OR cultures also grew MSSA and GBS (covered by cefazolin).  TEE done today negative for apparent vegetations.   RECOMMENDATIONS:    Discussed antibiotic options for patient given his planned travel to [place] later this month. He would prefer to do so without a PICC line or IV antibiotics if possible. Will plan for cefazolin IV through 7/14 (patient departs for [place] 7/15 before travelling on to [place]). Starting 7/15, will transition to linezolid 600 mg every 12 hours p.o. for 2 additional weeks to complete 4 weeks total of antibiotics in the setting of bacteremia status post amputation for his osteomyelitis.  Additionally, discussed with patient it is difficult to discern if all osteomyelitic bone was removed at the time of surgery so this 4-week course also provides "mop up" coverage for this as well. See OPAT information below.  Appointment on 7/14 at [facility] where we can take his PICC line out.  Diagnosis: Bacteremia, osteomyelitis  Culture Result: MSSA  Allergies  Allergen Reactions   Bee Venom Anaphylaxis   Hydrocodone Bit-Homatrop Mbr Other (See Comments)    Hallucinations, confusion, delirium Depressed feeling   Toradol [Ketorolac Tromethamine] Other (See Comments)    Hallucinations, confusion, delirium   Sulfadiazine     NDC YYQM:[id number] NDC BVQX:[id number] NDC LKJZ:[id number]   Morphine And Related Other (See Comments)    Hallucinations, back in the 80s. States has taken vicodin before w/o problems    Sulfa Drugs Cross Reactors Rash    OPAT Orders Discharge  antibiotics to be given via PICC line Discharge antibiotics: Per pharmacy protocol  Cefazolin 2gm q8h  Duration: 2 weeks End Date: 01/09/22  [facility] Care Per Protocol:  Home health RN for IV administration and teaching; PICC line care and labs.    Labs weekly while on IV antibiotics: _xxx_ CBC with differential _xxx_ BMP __ CMP _xxx_ CRP _xxx_ ESR __ Vancomycin trough __ CK  _xxx_ Please pull PIC at completion of IV antibiotics __ Please leave PIC in place until doctor has seen patient or been notified  Fax weekly labs to [phone]  Clinic Follow Up Appt: 01/09/22 at 10:30 am with Janene Madeira    Principal Problem:   MSSA bacteremia Active Problems:   Diabetes with neuropathy   Coronary artery disease   Cirrhosis ([place])   Hypertension   Osteomyelitis of great toe of left foot (HCC)   Acute renal failure superimposed on stage 3a chronic kidney disease ([place])    MEDICATIONS:    Scheduled Meds:  (feeding supplement) PROSource Plus  30 mL Oral BID   amLODipine  5 mg Oral Daily   atorvastatin  80 mg Oral QHS   carvedilol  12.5 mg Oral BID WC   Chlorhexidine Gluconate Cloth  6 each Topical Daily   clonazePAM  0.5 mg Oral QHS   clopidogrel  75 mg Oral Daily   DULoxetine  60 mg Oral QHS   gabapentin  300 mg Oral BID   heparin injection (subcutaneous)  5,000 Units Subcutaneous Q8H   insulin aspart  0-15 Units Subcutaneous TID WC   insulin aspart  0-5 Units Subcutaneous QHS   isosorbide mononitrate  60 mg Oral Daily   multivitamin with minerals  1 tablet Oral Daily   pantoprazole  40 mg Oral Daily   Continuous Infusions:  sodium chloride 20 mL/hr at 12/25/21 1907    ceFAZolin (ANCEF) IV 2 g (01/01/22 0503)   PRN Meds:.sodium chloride, acetaminophen **OR** acetaminophen, HYDROmorphone (DILAUDID) injection, oxyCODONE, sodium chloride flush  SUBJECTIVE:   24 hour events:  No acute events noted overnight Afebrile, Tmax 98.4 Status post TEE this morning  which was reported as negative for vegetations PICC line has been placed  Patient reports no acute complaints.  He overall states he is doing well and he is looking forward to getting home soon.  Review of Systems  All other systems reviewed and are negative.     OBJECTIVE:   Blood pressure 125/86, pulse 70, temperature 97.6 F (36.4 C), temperature source Temporal, resp. rate 15, height 5' 10.98" (1.803 m), weight 113.4 kg, SpO2 97 %. Body mass index is 34.88 kg/m.  Physical Exam Constitutional:      General: He is not in acute distress.    Appearance: Normal appearance.  HENT:     Head: Normocephalic and atraumatic.  Eyes:     Extraocular Movements: Extraocular movements intact.     Conjunctiva/sclera: Conjunctivae normal.  Pulmonary:     Effort: Pulmonary effort is normal. No respiratory distress.  Abdominal:     General: There is no distension.     Palpations: Abdomen is soft.  Musculoskeletal:     Cervical back: Normal range of motion and neck supple.     Comments: PICC line in place Left great toe wrapped  Skin:    General: Skin is warm and dry.  Neurological:     General: No focal deficit present.     Mental Status: He is alert and oriented to person, place, and time.  Psychiatric:        Mood and Affect: Mood normal.        Behavior: Behavior normal.      Lab Results: Lab Results  Component Value Date   WBC 6.1 12/31/2021   HGB 13.4 12/31/2021   HCT 40.3 12/31/2021   MCV 87.4 12/31/2021   PLT 208 12/31/2021    Lab Results  Component Value Date   NA 138 01/01/2022   K 4.3 01/01/2022   CO2 25 01/01/2022   GLUCOSE 105 (H) 01/01/2022   BUN 21 01/01/2022   CREATININE 1.51 (H) 01/01/2022   CALCIUM 9.1 01/01/2022   GFRNONAA 52 (L) 01/01/2022   GFRAA 66 08/08/2020    Lab Results  Component Value Date   ALT 14 12/31/2021   AST 25 12/31/2021   ALKPHOS 92 12/31/2021   BILITOT 1.0 12/31/2021       Component Value Date/Time   CRP 10.1 (H)  12/27/2021 0109       Component Value Date/Time   ESRSEDRATE 31 (H) 12/27/2021 0109     I have reviewed the micro and lab results in Epic.  Imaging: [place] EKG SITE RITE  Result Date: 12/31/2021 If Site Rite image not attached, placement could not be confirmed due to current cardiac rhythm.    Imaging independently reviewed in Epic.    Raynelle Highland for Infectious Disease [facility] [phone] pager 01/01/2022, 10:05 AM  I have personally spent 50 minutes involved in face-to-face and non-face-to-face activities for this  patient on the day of the visit. Professional time spent includes the following activities: Preparing to see the patient (review of tests), Obtaining and/or reviewing separately obtained history (admission/discharge record), Performing a medically appropriate examination and/or evaluation , Ordering medications/tests/procedures, referring and communicating with other health care professionals, Documenting clinical information in the EMR, Independently interpreting results (not separately reported), Communicating results to the patient/family/caregiver, Counseling and educating the patient/family/caregiver and Care coordination (not separately reported).

## 2022-01-01 NOTE — Progress Notes (Signed)
PHARMACY CONSULT NOTE FOR:  OUTPATIENT  PARENTERAL ANTIBIOTIC THERAPY (OPAT)  Indication: MSSA Bacteremia Regimen: Cefazolin 2 gm IV q 8 hours  End date: 01/09/22  IV antibiotic discharge orders are pended. To discharging provider:  please sign these orders via discharge navigator,  Select New Orders & click on the button choice - Manage This Unsigned Work.     Thank you for allowing pharmacy to be a part of this patient's care.  Jimmy Footman, PharmD, BCPS, [facility] Clinical Pharmacist Phone: [phone] 01/01/2022, 10:03 AM

## 2022-01-01 NOTE — Plan of Care (Signed)
No acute events overnight.    Problem: Education: Goal: Knowledge of General Education information will improve Description: Including pain rating scale, medication(s)/side effects and non-pharmacologic comfort measures Outcome: Progressing   Problem: Health Behavior/Discharge Planning: Goal: Ability to manage health-related needs will improve Outcome: Progressing   Problem: Clinical Measurements: Goal: Ability to maintain clinical measurements within normal limits will improve Outcome: Progressing Goal: Will remain free from infection Outcome: Progressing Goal: Diagnostic test results will improve Outcome: Progressing Goal: Respiratory complications will improve Outcome: Progressing Goal: Cardiovascular complication will be avoided Outcome: Progressing   Problem: Activity: Goal: Risk for activity intolerance will decrease Outcome: Progressing   Problem: Nutrition: Goal: Adequate nutrition will be maintained Outcome: Progressing   Problem: Coping: Goal: Level of anxiety will decrease Outcome: Progressing   Problem: Elimination: Goal: Will not experience complications related to bowel motility Outcome: Progressing Goal: Will not experience complications related to urinary retention Outcome: Progressing   Problem: Pain Managment: Goal: General experience of comfort will improve Outcome: Progressing   Problem: Safety: Goal: Ability to remain free from injury will improve Outcome: Progressing   Problem: Skin Integrity: Goal: Risk for impaired skin integrity will decrease Outcome: Progressing   Problem: Education: Goal: Ability to describe self-care measures that may prevent or decrease complications (Diabetes Survival Skills Education) will improve Outcome: Progressing Goal: Individualized Educational Video(s) Outcome: Progressing   Problem: Coping: Goal: Ability to adjust to condition or change in health will improve Outcome: Progressing   Problem: Fluid  Volume: Goal: Ability to maintain a balanced intake and output will improve Outcome: Progressing   Problem: Health Behavior/Discharge Planning: Goal: Ability to identify and utilize available resources and services will improve Outcome: Progressing Goal: Ability to manage health-related needs will improve Outcome: Progressing   Problem: Metabolic: Goal: Ability to maintain appropriate glucose levels will improve Outcome: Progressing   Problem: Nutritional: Goal: Maintenance of adequate nutrition will improve Outcome: Progressing Goal: Progress toward achieving an optimal weight will improve Outcome: Progressing   Problem: Skin Integrity: Goal: Risk for impaired skin integrity will decrease Outcome: Progressing   Problem: Tissue Perfusion: Goal: Adequacy of tissue perfusion will improve Outcome: Progressing

## 2022-01-01 NOTE — Progress Notes (Signed)
Pt off floor to have TEE completed.

## 2022-01-01 NOTE — Anesthesia Preprocedure Evaluation (Addendum)
Anesthesia Evaluation  Patient identified by MRN, date of birth, ID band Patient awake    Reviewed: Allergy & Precautions, NPO status , Patient's Chart, lab work & pertinent test results  Airway Mallampati: III  TM Distance: >3 FB Neck ROM: Full    Dental  (+) Edentulous Upper, Edentulous Lower   Pulmonary sleep apnea ,    Pulmonary exam normal        Cardiovascular hypertension, Pt. on medications and Pt. on home beta blockers + CAD and + Cardiac Stents (x 1 )  Normal cardiovascular exam     Neuro/Psych  Headaches, PSYCHIATRIC DISORDERS Depression  Neuromuscular disease    GI/Hepatic PUD, GERD  Medicated and Controlled,(+) Cirrhosis       , Hepatitis -  Endo/Other  diabetes, Oral Hypoglycemic Agents  Renal/GU Renal disease     Musculoskeletal  (+) Arthritis ,   Abdominal (+) + obese,   Peds  Hematology  (+) Blood dyscrasia, ,   Anesthesia Other Findings BACTEREMIA  Reproductive/Obstetrics                            Anesthesia Physical  Anesthesia Plan  ASA: 3  Anesthesia Plan: MAC   Post-op Pain Management:    Induction: Intravenous  PONV Risk Score and Plan: 1 and Propofol infusion and Treatment may vary due to age or medical condition  Airway Management Planned: Nasal Cannula  Additional Equipment:   Intra-op Plan:   Post-operative Plan:   Informed Consent: I have reviewed the patients History and Physical, chart, labs and discussed the procedure including the risks, benefits and alternatives for the proposed anesthesia with the patient or authorized representative who has indicated his/her understanding and acceptance.       Plan Discussed with: CRNA  Anesthesia Plan Comments:        Anesthesia Quick Evaluation

## 2022-01-02 ENCOUNTER — Encounter (HOSPITAL_COMMUNITY): Payer: Self-pay | Admitting: Cardiology

## 2022-01-02 DIAGNOSIS — M869 Osteomyelitis, unspecified: Secondary | ICD-10-CM | POA: Diagnosis not present

## 2022-01-03 NOTE — Telephone Encounter (Signed)
Spoke to patient and made him aware of the office change.  He would like to come see Dr. Erin Hearing 7/28 at 10:45am.  Note sent to office for change.

## 2022-01-05 ENCOUNTER — Other Ambulatory Visit: Payer: Self-pay

## 2022-01-05 ENCOUNTER — Ambulatory Visit (INDEPENDENT_AMBULATORY_CARE_PROVIDER_SITE_OTHER): Payer: 59 | Admitting: Podiatry

## 2022-01-05 ENCOUNTER — Other Ambulatory Visit (HOSPITAL_BASED_OUTPATIENT_CLINIC_OR_DEPARTMENT_OTHER): Payer: Self-pay

## 2022-01-05 DIAGNOSIS — M86172 Other acute osteomyelitis, left ankle and foot: Secondary | ICD-10-CM | POA: Diagnosis not present

## 2022-01-05 DIAGNOSIS — E1165 Type 2 diabetes mellitus with hyperglycemia: Secondary | ICD-10-CM

## 2022-01-05 DIAGNOSIS — I10 Essential (primary) hypertension: Secondary | ICD-10-CM

## 2022-01-05 DIAGNOSIS — I200 Unstable angina: Secondary | ICD-10-CM

## 2022-01-05 DIAGNOSIS — M5441 Lumbago with sciatica, right side: Secondary | ICD-10-CM | POA: Diagnosis not present

## 2022-01-05 MED ORDER — METHYLPREDNISOLONE 4 MG PO TBPK
ORAL_TABLET | ORAL | 0 refills | Status: DC
  Filled 2022-01-05: qty 21, 6d supply, fill #0

## 2022-01-05 NOTE — Progress Notes (Unsigned)
Subjective: James [name] is a 66 y.o. is seen today in office s/p left hallux partial toe amputation preformed on 12/27/2021.  Pain is controlled.  Denies any fevers or chills.  No new concerns.    He is still on cefazolin, ending 01/09/2022. ID recommends to continue with IV cefazolin for 1 more week till 01/09/2022 and then prescribe Zyvox 600 mg p.o. twice daily for 2 more weeks to complete 4 weeks of treatment  Objective: General: No acute distress, AAOx3  DP/PT pulses palpable, CRT < 3 sec to all digits.  Left foot: Incision is well coapted without any evidence of dehiscence with sutures intact.  Minimal surrounding erythema without any ascending cellulitis.  No fluctuance or crepitation but there is a moderate.. There is minimal edema around the surgical site. There is no pain along the surgical site.  No other areas of tenderness to bilateral lower extremities.  No other open lesions or pre-ulcerative lesions.  No pain with calf compression, swelling, warmth, erythema.   Assessment and Plan:  Status post left hallux amputation,  -Treatment options discussed including all alternatives, risks, and complications -Dressing was changed today.  Appears to be healing well at this time.  Small amount of antibiotic ointment and bandage applied.  Continue daily dressing changes. -Surgical shoe, offloading -Patient has upcoming trip to [place] planned.  Hesitant for this but discussed keeping area clean and dry needs to keep her foot elevated.  She is not able to do straight. -Monitor for any clinical signs or symptoms of infection and DVT/PE and directed to call the office immediately should any occur or go to the ER. -Follow-up as scheduled for suture removal or sooner if any problems arise. In the meantime, encouraged to call the office with any questions, concerns, change in symptoms.   Celesta Gentile, DPM

## 2022-01-06 DIAGNOSIS — R7881 Bacteremia: Secondary | ICD-10-CM | POA: Diagnosis not present

## 2022-01-06 DIAGNOSIS — M869 Osteomyelitis, unspecified: Secondary | ICD-10-CM | POA: Diagnosis not present

## 2022-01-07 ENCOUNTER — Telehealth: Payer: Self-pay | Admitting: *Deleted

## 2022-01-07 ENCOUNTER — Ambulatory Visit: Payer: 59 | Admitting: Plastic Surgery

## 2022-01-07 NOTE — Chronic Care Management (AMB) (Signed)
  Care Management   Outreach Note  01/07/2022 Name: James [name] MRN: [id number] DOB: 12-11-1955  Referred by: Colon Branch, MD Reason for referral : Care Coordination (Initial outreach to schedule referral with [facility] )   An unsuccessful telephone outreach was attempted today. The patient was referred to the case management team for assistance with care management and care coordination.   Follow Up Plan:  A HIPAA compliant phone message was left for the patient providing contact information and requesting a return call.   Julian Hy, [place] Direct Dial: [phone]

## 2022-01-08 ENCOUNTER — Other Ambulatory Visit (HOSPITAL_BASED_OUTPATIENT_CLINIC_OR_DEPARTMENT_OTHER): Payer: Self-pay

## 2022-01-09 ENCOUNTER — Encounter: Payer: Self-pay | Admitting: Infectious Diseases

## 2022-01-09 ENCOUNTER — Ambulatory Visit (INDEPENDENT_AMBULATORY_CARE_PROVIDER_SITE_OTHER): Payer: 59 | Admitting: Infectious Diseases

## 2022-01-09 ENCOUNTER — Other Ambulatory Visit: Payer: Self-pay

## 2022-01-09 DIAGNOSIS — Z792 Long term (current) use of antibiotics: Secondary | ICD-10-CM | POA: Diagnosis not present

## 2022-01-09 DIAGNOSIS — Z452 Encounter for adjustment and management of vascular access device: Secondary | ICD-10-CM | POA: Insufficient documentation

## 2022-01-09 DIAGNOSIS — B9561 Methicillin susceptible Staphylococcus aureus infection as the cause of diseases classified elsewhere: Secondary | ICD-10-CM

## 2022-01-09 DIAGNOSIS — M869 Osteomyelitis, unspecified: Secondary | ICD-10-CM

## 2022-01-09 DIAGNOSIS — R7881 Bacteremia: Secondary | ICD-10-CM | POA: Diagnosis not present

## 2022-01-09 NOTE — Assessment & Plan Note (Addendum)
S/P amputation with intra-op cultures growing out MSSA and GBS and secondary MSSA bacteremia. TEE was negative. He has done well with 2 weeks of IV treatment with cefazolin. He will begin taking linezolid twice a day tomorrow 7/15 to finish out 2 weeks of treatment to ensure residual bone is well treated.  Post Op notes reviewed - incision is nice an dry without any drainage. Sutures to remain another 2 weeks.   FU with podiatry team - we are happy to see him back if any concerns arise.

## 2022-01-09 NOTE — Assessment & Plan Note (Signed)
PICC Removal Note: S: Insertion site WNL without any redness or drainage. Dressing was occlusive and clean/intact  O: PICC line removed from R arm after sterile site prepped per protocol. PICC catheter tip visualized and intact @ 43 cm.  Pressure dressing applied with vaseline dressing and metapore tape. A: No redness, ecchymosis, edema, swelling, or drainage noted at site. P: Instructions provided on post PICC discharge care, including followup notification instructions.

## 2022-01-09 NOTE — Progress Notes (Signed)
Patient: James [name]  DOB: 1955/07/08 MRN: [id number] PCP: Colon Branch, MD  Referring Provider:   Chief Complaint  Patient presents with   Hospitalization Follow-up     Subjective:  James [name] is a 66 y.o. male here for hospital follow up on MSSA bacteremia due to osteomyelitis of the great toe. He is s/p partial left hallux amputation 7/1 with podiatry. TEE was negative for vegetations. OR cultures grew out MSSA and GBS. He cleared bacteremia quickly while inpatient at [facility].   He is doing well and has no concerns today aside from getting PICC line out for upcoming travel to [place]. He feels well, no fevers/chillls. Had post op check with Dr. Jacqualyn Posey yesterday - everything is dry, no dehiscence and intact sutures. Slight erythema but no cellulitis.    Review of Systems  Constitutional:  Negative for chills, fever, malaise/fatigue and weight loss.  HENT:  Negative for sore throat.        No dental problems  Respiratory:  Negative for cough and sputum production.   Cardiovascular:  Negative for chest pain and leg swelling.  Gastrointestinal:  Negative for abdominal pain, diarrhea and vomiting.  Genitourinary:  Negative for dysuria and flank pain.  Musculoskeletal:  Negative for joint pain, myalgias and neck pain.  Skin:  Negative for rash.  Neurological:  Negative for dizziness, tingling and headaches.  Psychiatric/Behavioral:  Negative for depression and substance abuse. The patient is not nervous/anxious and does not have insomnia.      Past Medical History:  Diagnosis Date   Abnormal cardiac CT angiography    Acid reflux    Annual physical exam 04/08/2015   Arthritis    Atypical chest pain 06/13/2020   Blood transfusion without reported diagnosis    Body mass index (BMI) 35.0-35.9, adult 04/05/2019   Chronic fatigue 01/28/2015   Chronic headaches    on cymbalta   Chronic migraine w/o aura, not intractable, w/o stat migr 10/24/2018   Cirrhosis ([place])    Colon  polyps    Complication of anesthesia    problems waking up from anesthesia   Coronary artery disease 01/23/2019   Depression    on cymbalta   Diabetes mellitus with neuropathy ([place])    Diabetes with neuropathy 04/25/2013   Diverticulitis 03/2013   Dyslipidemia 05/29/2019   Eczema    Elevated LFTs    Epidural lipomatosis 10/05/2018   Essential (primary) hypertension 04/05/2019   Essential hypertension 10/10/2019   Fatty liver    GERD (gastroesophageal reflux disease) 04/28/2011   H/O craniotomy 05/07/2015   Headache 04/28/2011   Hepatitis 10/2017   NASH cirrhosis   History of kidney stones    Hyperlipidemia    Hypersomnia with sleep apnea 01/28/2015   Hypertension    IDA (iron deficiency anemia) 01/24/2019   Idiopathic intracranial hypertension 01/14/2017   Insomnia 04/26/2013   Kidney stone    Liver cirrhosis secondary to NASH (nonalcoholic steatohepatitis) ([place]) 01/02/2016   Low back pain 04/05/2019   Lower back injury 08/14/2019   Morbid obesity ([place])    Neuromuscular disorder ([place])    neuropathy   Neuropathy    Nonalcoholic steatohepatitis 0/01/6760   Obstructive hydrocephalus ([place]) 01/28/2015   OSA -- dx ~ 2012, cpap intolerant 09/04/2014    dx ~ 2012, cpap intolerant    PCP NOTES >>> 04/08/2015   Post-op pain 03/19/2019   Post-traumatic hydrocephalus (HCC)    s/p shunts x 2 (first got infected )   Presence  of cerebrospinal fluid drainage device 07/28/2011   Psoriasis    sees Dr [name]   Psoriatic arthritis ([place])    REM behavioral disorder 01/14/2017   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   see's Dr.Ortmann   Severe obesity (BMI >= 40) ([place]) 01/28/2015   SI (sacroiliac) joint dysfunction 08/14/2019   Sigmoid diverticulitis 04/25/2013   Sleep apnea    no CPAP      Spondylolisthesis, lumbar region 03/16/2019   Stomach ulcer    Testosterone deficiency 04/28/2011   VP (ventriculoperitoneal) shunt status 07/31/2020    Outpatient Medications Prior to Visit   Medication Sig Dispense Refill   acetaminophen (TYLENOL) 325 MG tablet Take 2 tablets (650 mg total) by mouth every 4 (four) hours as needed for headache or mild pain.     Adalimumab (HUMIRA PEN) 40 MG/0.8ML PNKT INJECT 40 MG (0.8 ML) UNDER THE SKIN EVERY OTHER WEEK. 2 each 5   amLODipine (NORVASC) 5 MG tablet Take 1 tablet (5 mg total) by mouth daily. 90 tablet 3   atorvastatin (LIPITOR) 80 MG tablet Take 1 tablet (80 mg total) by mouth at bedtime. 90 tablet 1   carvedilol (COREG) 12.5 MG tablet TAKE 1 TABLET (12.5 MG TOTAL) BY MOUTH 2 (TWO) TIMES DAILY WITH A MEAL. (Patient taking differently: Take 12.5 mg by mouth 2 (two) times daily with a meal.) 180 tablet 1   ceFAZolin (ANCEF) IVPB Inject 2 g into the vein every 8 (eight) hours for 8 days. Indication:  MSSA Bacteremia First Dose: Yes Last Day of Therapy:  01/09/22 Labs - Once weekly:  CBC/D and BMP, Labs - Every other week:  ESR and CRP Method of administration: IV Push Method of administration may be changed at the discretion of home infusion pharmacist based upon assessment of the patient and/or caregiver's ability to self-administer the medication ordered. 24 Units 0   clonazePAM (KLONOPIN) 0.5 MG tablet TAKE 1 TABLET BY MOUTH EVERY NIGHT AT BEDTIME 30 tablet 5   clopidogrel (PLAVIX) 75 MG tablet TAKE 1 TABLET (75 MG TOTAL) BY MOUTH DAILY. 90 tablet 3   Continuous Blood Gluc Sensor (FREESTYLE LIBRE 14 DAY SENSOR) MISC Apply 1 sensor every 14 (fourteen) days. 2 each 2   DULoxetine (CYMBALTA) 60 MG capsule TAKE 2 CAPSULES (120 MG TOTAL) BY MOUTH DAILY. (Patient taking differently: Take 60 mg by mouth daily.) 180 capsule 1   EPINEPHrine (EPIPEN 2-PAK) 0.3 mg/0.3 mL IJ SOAJ injection Inject 0.3 mLs (0.3 mg total) into the muscle once as needed for up to 1 dose for anaphylaxis. 2 each 2   fenofibrate micronized (LOFIBRA) 134 MG capsule TAKE 1 CAPSULE BY MOUTH THREE TIMES WEEKLY (Patient taking differently: Take 134 mg by mouth daily before  breakfast.) 15 capsule 3   gabapentin (NEURONTIN) 600 MG tablet TAKE 1 TABLET (600 MG TOTAL) BY MOUTH 2 (TWO) TIMES DAILY. (Patient taking differently: Take 600 mg by mouth 2 (two) times daily.) 180 tablet 1   glimepiride (AMARYL) 4 MG tablet TAKE 1 TABLET BY MOUTH ONCE DAILY BEFORE BREAKFAST (Patient taking differently: Take 4 mg by mouth daily before breakfast.) 30 tablet 3   isosorbide mononitrate (IMDUR) 60 MG 24 hr tablet Take 1 tablet (60 mg total) by mouth daily. 30 tablet 6   [START ON 01/10/2022] linezolid (ZYVOX) 600 MG tablet Take 1 tablet (600 mg total) by mouth 2 (two) times daily for 14 days. Start taking 01/10/22 after PICC line is removed on 7/14. 28 tablet 0  metFORMIN (GLUCOPHAGE) 500 MG tablet Take 2 tablets (1,000 mg total) by mouth 2 (two) times daily. 120 tablet 3   methylPREDNISolone (MEDROL DOSEPAK) 4 MG TBPK tablet Take 6 tablets by mouth on day 1, then 5 tablets on day 2, then 4 tablets on day 3, then 3 tablets on day 4, then 2 tablets on day 5, then 1 tablet on day 6. 21 tablet 0   nitroGLYCERIN (NITROSTAT) 0.4 MG SL tablet Place 1 tablet (0.4 mg total) under the tongue every 5 (five) minutes for 3 doses as needed for chest pain. 25 tablet 6   oxyCODONE (OXY IR/ROXICODONE) 5 MG immediate release tablet Take 1 tablet (5 mg total) by mouth every 6 (six) hours as needed for moderate pain. 20 tablet 0   pantoprazole (PROTONIX) 40 MG tablet Take 1 tablet (40 mg total) by mouth daily. 90 tablet 1   Semaglutide, 2 MG/DOSE, (OZEMPIC, 2 MG/DOSE,) 8 MG/3ML SOPN Inject 2 mg into the skin weekly (Patient taking differently: Inject 2 mg into the skin once a week. Inject 2 mg into the skin weekly) 9 mL 2   aspirin 81 MG chewable tablet Chew 1 tablet (81 mg total) by mouth daily. (Patient not taking: Reported on 12/26/2021)     No facility-administered medications prior to visit.     Allergies  Allergen Reactions   Bee Venom Anaphylaxis   Hydrocodone Bit-Homatrop Mbr Other (See Comments)     Hallucinations, confusion, delirium Depressed feeling   Toradol [Ketorolac Tromethamine] Other (See Comments)    Hallucinations, confusion, delirium   Sulfadiazine     NDC UYEB:[id number] NDC FGBM:[id number] NDC MVVK:[id number]   Morphine And Related Other (See Comments)    Hallucinations, back in the 80s. States has taken vicodin before w/o problems    Sulfa Drugs Cross Reactors Rash    Social History   Tobacco Use   Smoking status: Never   Smokeless tobacco: Never  Vaping Use   Vaping Use: Never used  Substance Use Topics   Alcohol use: No   Drug use: No    Family History  Problem Relation Age of Onset   Other Mother    Lung cancer Father        alive, former smoker    Heart disease Brother        MI age 49   Other Brother        Murdered   Down syndrome Son    Diabetes Neg Hx    Prostate cancer Neg Hx    Colon cancer Neg Hx    Stomach cancer Neg Hx    Pancreatic cancer Neg Hx    Liver disease Neg Hx     Objective:   Vitals:   01/09/22 1033  BP: 108/70  Pulse: 80  Temp: (!) 97.5 F (36.4 C)  TempSrc: Temporal  Weight: 247 lb (112 kg)   Body mass index is 34.47 kg/m.  Physical Exam Vitals reviewed.  Constitutional:      Appearance: Normal appearance. He is not ill-appearing.  HENT:     Head: Normocephalic.     Mouth/Throat:     Mouth: Mucous membranes are moist.     Pharynx: Oropharynx is clear.  Eyes:     General: No scleral icterus. Pulmonary:     Effort: Pulmonary effort is normal.  Musculoskeletal:        General: Normal range of motion.     Cervical back: Normal range of motion.  Skin:    Coloration: Skin  is not jaundiced or pale.  Neurological:     Mental Status: He is alert and oriented to person, place, and time.  Psychiatric:        Mood and Affect: Mood normal.        Judgment: Judgment normal.     Lab Results: Lab Results  Component Value Date   WBC 6.1 12/31/2021   HGB 13.4 12/31/2021   HCT 40.3 12/31/2021    MCV 87.4 12/31/2021   PLT 208 12/31/2021    Lab Results  Component Value Date   CREATININE 1.51 (H) 01/01/2022   BUN 21 01/01/2022   NA 138 01/01/2022   K 4.3 01/01/2022   CL 107 01/01/2022   CO2 25 01/01/2022    Lab Results  Component Value Date   ALT 14 12/31/2021   AST 25 12/31/2021   ALKPHOS 92 12/31/2021   BILITOT 1.0 12/31/2021     Assessment & Plan:   Problem List Items Addressed This Visit       Unprioritized   Long term (current) use of antibiotics    All OPAT lab work reviewed and within normal limits.  ESR ~40 and CRP normalized.  PLT 208       MSSA bacteremia    Secondary to great toe OM. Well treated. PICC line removed and transitioned to PO therapy for ongoing duration.       Osteomyelitis of great toe of left foot (HCC)    S/P amputation with intra-op cultures growing out MSSA and GBS and secondary MSSA bacteremia. TEE was negative. He has done well with 2 weeks of IV treatment with cefazolin. He will begin taking linezolid twice a day tomorrow 7/15 to finish out 2 weeks of treatment to ensure residual bone is well treated.  Post Op notes reviewed - incision is nice an dry without any drainage. Sutures to remain another 2 weeks.   FU with podiatry team - we are happy to see him back if any concerns arise.       PICC (peripherally inserted central catheter) in place    PICC Removal Note: S: Insertion site WNL without any redness or drainage. Dressing was occlusive and clean/intact  O: PICC line removed from R arm after sterile site prepped per protocol. PICC catheter tip visualized and intact @ 43 cm.  Pressure dressing applied with vaseline dressing and metapore tape. A: No redness, ecchymosis, edema, swelling, or drainage noted at site. P: Instructions provided on post PICC discharge care, including followup notification instructions.       Total Encounter Time: 40 min including hospital review, face to face discussion, review of labs and personal  removal of patient's PICC line.    Janene Madeira, MSN, NP-C [facility] Pager: [phone] Office: [phone]   01/09/22  11:59 AM

## 2022-01-09 NOTE — Assessment & Plan Note (Addendum)
All OPAT lab work reviewed and within normal limits.  ESR ~40 and CRP normalized.  PLT 208

## 2022-01-09 NOTE — Patient Instructions (Signed)
[place]!   Please leave your bandage on the arm in place for 48 hours prior to removing this. Please do not submerge in baths/pools until it is removed.  If you notice bleeding that soaks the dressing please go to ER to have this checked - should be OK though.   Please start the linezolid antibiotic tomorrow - one tablet twice a day for 2 weeks.   Keep good care of your foot - would recommend to keep the incision out of any sand / ocean water as this is full of other bacteria you are not used to and I would prefer the incision stay away from that. Keep it covered with a well fitting dressing on all sides.

## 2022-01-09 NOTE — Assessment & Plan Note (Signed)
Secondary to great toe OM. Well treated. PICC line removed and transitioned to PO therapy for ongoing duration.

## 2022-01-12 ENCOUNTER — Telehealth: Payer: Self-pay | Admitting: Cardiology

## 2022-01-12 ENCOUNTER — Ambulatory Visit: Payer: 59 | Admitting: Neurology

## 2022-01-12 NOTE — Telephone Encounter (Signed)
   Pre-operative Risk Assessment    Patient Name: James [name]  DOB: Jan 28, 1956 MRN: [id number]      Request for Surgical Clearance    Procedure:   Lumbar spine-right-L2-L3 interlaminar  Date of Surgery:  Clearance 02/03/22                                 Surgeon:  Dr. Lenord Carbo Surgeon's Group or Practice Name:  [facility]  Phone number:  [phone] Fax number:  [phone]   Type of Clearance Requested:   - Pharmacy:  Hold Clopidogrel (Plavix) 7 days prior    Type of Anesthesia:  Not Indicated   Additional requests/questions:      Gaylyn Lambert   01/12/2022, 12:58 PM

## 2022-01-13 ENCOUNTER — Other Ambulatory Visit (HOSPITAL_COMMUNITY): Payer: Self-pay

## 2022-01-13 NOTE — Telephone Encounter (Signed)
   Patient Name: James [name]  DOB: April 17, 1956 MRN: [id number]  Primary Cardiologist: Jenne Campus, MD  Chart reviewed as part of pre-operative protocol coverage.   Hx of coronary artery disease status post PCI to the LAD in 2022.   Cardiac catheterization has been performed 07/2021 showed unchanged lesion compared to prior cardiac catheterization he does have about 75% lesion on the circumflex artery.  He also got multiple residual 30 to 50% lesions.  Dr. Agustin Cree, Please give your recommendations regarding Plavix. Please forward your response to P CV DIV PREOP.   Thank you   Leanor Kail, PA 01/13/2022, 12:28 PM

## 2022-01-13 NOTE — Chronic Care Management (AMB) (Signed)
  Care Coordination  Note  01/13/2022 Name: James [name] MRN: [id number] DOB: 07/22/1955  James [name] is a 65 y.o. year old male who is a primary care patient of Colon Branch, MD. I reached out to [name] by phone today to offer care coordination services.      [name] was given information about Care Coordination services today including:  The Care Coordination services include support from the care team which includes your Nurse Coordinator, Clinical Social Worker, or Pharmacist.  The Care Coordination team is here to help remove barriers to the health concerns and goals most important to you. Care Coordination services are voluntary and the patient may decline or stop services at any time by request to their care team member.   Patient agreed to services and verbal consent obtained.   Follow up plan: Telephone appointment with care coordination team member scheduled for: 01/22/2022  Julian Hy, [place] Direct Dial: [phone]

## 2022-01-14 ENCOUNTER — Other Ambulatory Visit (HOSPITAL_BASED_OUTPATIENT_CLINIC_OR_DEPARTMENT_OTHER): Payer: Self-pay

## 2022-01-20 ENCOUNTER — Other Ambulatory Visit (HOSPITAL_COMMUNITY): Payer: Self-pay

## 2022-01-20 ENCOUNTER — Other Ambulatory Visit (HOSPITAL_BASED_OUTPATIENT_CLINIC_OR_DEPARTMENT_OTHER): Payer: Self-pay

## 2022-01-20 ENCOUNTER — Other Ambulatory Visit: Payer: Self-pay

## 2022-01-20 ENCOUNTER — Ambulatory Visit (INDEPENDENT_AMBULATORY_CARE_PROVIDER_SITE_OTHER): Payer: 59 | Admitting: Podiatry

## 2022-01-20 DIAGNOSIS — I200 Unstable angina: Secondary | ICD-10-CM

## 2022-01-20 DIAGNOSIS — M86172 Other acute osteomyelitis, left ankle and foot: Secondary | ICD-10-CM

## 2022-01-22 ENCOUNTER — Ambulatory Visit: Payer: Self-pay

## 2022-01-22 NOTE — Patient Outreach (Addendum)
  Care Coordination   Initial Visit Note   01/22/2022 Name: James [name] MRN: [id number] DOB: 1956-04-28  James [name] is a 66 y.o. year old male who sees James [name], James Berthold, MD for primary care. I spoke with  James [name] by phone today  What matters to the patients health and wellness today?  James [name] states everything is going good. Denies any concern, issues or needs at this time.   States has freestyle libre and checks blood sugar sometimes every two hours. States blood sugar this morning was 50 and he treated with drinking regular soda. He states does not usually go down that low. Patient denies any issues with medication management. Recent amputation of great toe. Reports has had follow up with provider and area is healing well. Offered Care Coordination services, education, community resources. Patient declines services. RNCM encouraged patient to contact RN and or PCP if needs change in the future.   Goals Addressed             This Visit's Progress    COMPLETED: Patient Stated: Patient denies any concerns, questions or goals       Encouraged patient to notify Endocrinologist if he has consistent episodes of blood sugar <70/more than 2 episodes.  Discussed low blood sugar and treatment for low blood sugar.  Reviewed upcoming appointments.  Encouraged to continue to attend provider visits as scheduled.  Medicatons reviewed Offered Care Coordination services, education, community resources Reviewed gaps of care with patient RNCM encouraged patient to contact RN and or PCP if needs change in the future.        SDOH assessments and interventions completed:   Yes SDOH Interventions Today    Flowsheet Row Most Recent Value  SDOH Interventions   Food Insecurity Interventions Intervention Not Indicated  Housing Interventions Intervention Not Indicated  Transportation Interventions Intervention Not Indicated      Care Coordination Interventions Activated:  Yes Care  Coordination Interventions:  Yes, provided  Follow up plan: No further intervention required.  Encounter Outcome:  Pt. Visit Completed  James Silversmith, RN, MSN, BSN, CCM Care Management Coordinator [phone]

## 2022-01-22 NOTE — Patient Instructions (Addendum)
Visit Information  Thank you for allowing me to share the care management and care coordination services that are available to you as part of your health plan and services through your primary care provider and medical home. Please reach out to your primary care provider or me at [phone] if the care management/care coordination team may be of assistance to you in the future.   Thea Silversmith, RN, MSN, BSN, CCM Care Management Coordinator [phone]

## 2022-01-23 ENCOUNTER — Ambulatory Visit: Payer: 59 | Admitting: Plastic Surgery

## 2022-01-26 NOTE — Progress Notes (Signed)
Subjective: James [name] is a 66 y.o. is seen today in office s/p left hallux partial toe amputation preformed on 12/27/2021.  Pain is controlled.  Presents today for suture removal.  Denies any fevers or chills.  No new concerns.    He is still on cefazolin, ending 01/09/2022. ID recommends to continue with IV cefazolin for 1 more week till 01/09/2022 and then prescribe Zyvox 600 mg p.o. twice daily for 2 more weeks to complete 4 weeks of treatment  Objective: General: No acute distress, AAOx3  DP/PT pulses palpable, CRT < 3 sec to all digits.  Left foot: Incision is well coapted without any evidence of dehiscence with sutures intact.  Faint rim of erythema but this appears to be improved there is no ascending cellulitis there is no fluctuance or crepitation and there is no malodor.  There is no tenderness on exam. No other open lesions or pre-ulcerative lesions.  No pain with calf compression, swelling, warmth, erythema.   Assessment and Plan:  Status post left hallux amputation  -Treatment options discussed including all alternatives, risks, and complications -Sutures removed today without complications.  Incision is well coapted.  Antibiotic ointment and dressing applied. -Surgical shoe, offloading -Finish course of antibiotics per infectious disease. -Remain in surgical shoe -Monitor for any clinical signs or symptoms of infection and DVT/PE and directed to call the office immediately should any occur or go to the ER. -Follow-up as scheduled for suture removal or sooner if any problems arise. In the meantime, encouraged to call the office with any questions, concerns, change in symptoms.   Celesta Gentile, DPM

## 2022-01-26 NOTE — Telephone Encounter (Signed)
Dr. Agustin Cree, patient is requesting preoperative cardiac evaluation for lumbar spine right L2-L3 interlaminar surgery.  Recently for recommendations on holding clopidogrel.  Procedure date 02/03/2022.  Please advise.  Please direct response to CV DIV preop pool.  Thank you for your help.  Jossie Ng. [place] L[place] NP-C     01/26/2022, 1:26 PM [facility] [place] [address] Office [phone] Fax [phone]

## 2022-01-28 NOTE — Telephone Encounter (Signed)
   Patient Name: James [name]  DOB: Mar 29, 1956 MRN: [id number]  Primary Cardiologist: Jenne Campus, MD  Chart reviewed as part of pre-operative protocol coverage. Pre-op clearance already addressed by colleagues in earlier phone notes. To summarize recommendations:  -"It should be fine to hold Plavix for 5 to 7 days" Dr. Agustin Cree  Will route this bundled recommendation to requesting provider via Epic fax function and remove from pre-op pool. Please call with questions.  Elgie Collard, PA-C 01/28/2022, 4:43 PM

## 2022-01-30 ENCOUNTER — Other Ambulatory Visit (INDEPENDENT_AMBULATORY_CARE_PROVIDER_SITE_OTHER): Payer: 59

## 2022-01-30 ENCOUNTER — Telehealth: Payer: Self-pay | Admitting: Cardiology

## 2022-01-30 ENCOUNTER — Other Ambulatory Visit (HOSPITAL_BASED_OUTPATIENT_CLINIC_OR_DEPARTMENT_OTHER): Payer: Self-pay

## 2022-01-30 DIAGNOSIS — E1165 Type 2 diabetes mellitus with hyperglycemia: Secondary | ICD-10-CM

## 2022-01-30 DIAGNOSIS — E782 Mixed hyperlipidemia: Secondary | ICD-10-CM

## 2022-01-30 LAB — COMPREHENSIVE METABOLIC PANEL
ALT: 25 U/L (ref 0–53)
AST: 26 U/L (ref 0–37)
Albumin: 4.3 g/dL (ref 3.5–5.2)
Alkaline Phosphatase: 89 U/L (ref 39–117)
BUN: 15 mg/dL (ref 6–23)
CO2: 26 mEq/L (ref 19–32)
Calcium: 9.3 mg/dL (ref 8.4–10.5)
Chloride: 107 mEq/L (ref 96–112)
Creatinine, Ser: 1.27 mg/dL (ref 0.40–1.50)
GFR: 59.96 mL/min — ABNORMAL LOW (ref >60.00)
Glucose, Bld: 118 mg/dL — ABNORMAL HIGH (ref 70–99)
Potassium: 4.6 mEq/L (ref 3.5–5.1)
Sodium: 141 mEq/L (ref 135–145)
Total Bilirubin: 1.5 mg/dL — ABNORMAL HIGH (ref 0.2–1.2)
Total Protein: 7.7 g/dL (ref 6.0–8.3)

## 2022-01-30 LAB — MICROALBUMIN / CREATININE URINE RATIO
Creatinine,U: 137.6 mg/dL
Microalb Creat Ratio: 1.5 mg/g (ref 0.0–30.0)
Microalb, Ur: 2.1 mg/dL — ABNORMAL HIGH (ref 0.0–1.9)

## 2022-01-30 LAB — LIPID PANEL
Cholesterol: 137 mg/dL (ref 0–200)
HDL: 42.1 mg/dL (ref >39.00)
LDL Cholesterol: 68 mg/dL (ref 0–99)
NonHDL: 94.98
Total CHOL/HDL Ratio: 3
Triglycerides: 137 mg/dL (ref 0.0–149.0)
VLDL: 27.4 mg/dL (ref 0.0–40.0)

## 2022-01-30 LAB — HEMOGLOBIN A1C: Hgb A1c MFr Bld: 7.1 % — ABNORMAL HIGH (ref 4.6–6.5)

## 2022-01-30 NOTE — Telephone Encounter (Signed)
Refaxed application status

## 2022-01-30 NOTE — Telephone Encounter (Signed)
   Name: James [name]  DOB: 06-Dec-1955  MRN: [id number]   Primary Cardiologist: Jenne Campus, MD  Clearance was originally faxed 01/12/2022 by myself.  Refaxed today by Esaw Grandchild, CMA.   There should be no further communication needed at this point.  Thanks.   Elgie Collard, PA-C 01/30/2022, 9:14 AM

## 2022-01-30 NOTE — Telephone Encounter (Signed)
Follow Up:     Wyatt Portela called to check on the status of this patient clearance. They need this asap please. Please fax to [phone].a

## 2022-02-02 ENCOUNTER — Ambulatory Visit (INDEPENDENT_AMBULATORY_CARE_PROVIDER_SITE_OTHER): Payer: 59 | Admitting: Neurology

## 2022-02-02 DIAGNOSIS — G43709 Chronic migraine without aura, not intractable, without status migrainosus: Secondary | ICD-10-CM

## 2022-02-02 DIAGNOSIS — Z982 Presence of cerebrospinal fluid drainage device: Secondary | ICD-10-CM

## 2022-02-02 DIAGNOSIS — G4752 REM sleep behavior disorder: Secondary | ICD-10-CM | POA: Diagnosis not present

## 2022-02-02 DIAGNOSIS — I200 Unstable angina: Secondary | ICD-10-CM

## 2022-02-02 DIAGNOSIS — G911 Obstructive hydrocephalus: Secondary | ICD-10-CM

## 2022-02-02 DIAGNOSIS — G4733 Obstructive sleep apnea (adult) (pediatric): Secondary | ICD-10-CM | POA: Diagnosis not present

## 2022-02-02 MED ORDER — ONABOTULINUMTOXINA 200 UNITS IJ SOLR
200.0000 [IU] | Freq: Once | INTRAMUSCULAR | Status: AC
  Administered 2022-02-02: 200 [IU] via INTRAMUSCULAR

## 2022-02-02 NOTE — Progress Notes (Signed)
[name]  PATIENT: James [name] DOB: 06-Oct-1955  REFERRING DOCTOR OR PCP:  Kathlene November  SOURCE: patient, notes from Dr. [name], imaging results and MRI and CT scans on PACS personally reviewed  _________________________________   HISTORICAL  CHIEF COMPLAINT:  Chief Complaint  Patient presents with   Procedure    Rm 1, alone. Here for Botox.     HISTORY OF PRESENT ILLNESS:  James [name] is a 66 y.o. man with idiopathic intracranial hypertension and recent increase in the frequency and severity of headache  Update 02/02/2022:  He reports a benefit from the Botox therapy with a significant reduction in headache frequency and intensity.  When he gets a headache he is usually able to treat it with Excedrin with benefit.  Before Botox he was having daily headaches that were more severe.  Neck pain is also doing better.    When pain is present, it is frontal more than occipital and off to the right is affected more than the left.  At times he gets nausea.  When the headaches more severe he has photophobia and phonophobia. Sometimes, headaches improve when he is laying down but other times they do not change between sitting and lying down. Moving usually worsens the headaches.  He did not get a benefit from multiple prophylactic medications including Keppra, gabapentin, Emgality, Cymbalta.    He has obstructive sleep apnea but was unable to tolerate CPAP.  We discussed the inspire device and he is potentially interested.  After losing 20 pounds, his BMI is right around 32.  I gave him the web link so he can learn more about the device and to let us know if he is interested.  It would be good for him to lose some more weight.     His REM behavior disorder has done well on nighttime clonazepam.  In early 2022, he had 3 cardiac stents after being diagnosed with CAD after presenting with chest pain.   He had a small MI.    He has a recent toe amputation.  Chronic  Migraine History: He was having daily HA with migrainous features.   He did not get a benefit from multiple prophylactic medications including Keppra, gabapentin, Emgality, Cymbalta.   Botox therapy started July 2021.  Spine: L-spine MRI 08/15/18 showed progressive epidural lipomatosis in the lower lumbar spine with progressive compression of the thecal sac and spinal stenosis at L3-4, L4-5, and L5-S1 compared to the prior study. Progressive facet degeneration and anterolisthesis at L4-5 since 2011.    MRI of the cervical spine performed 04/27/2017 showed  thoracic fusion hardware from C7 and below. There is mild spondylosis and disc bulging at C3-C4 through C6-C7. There does not appear to be any significant foraminal narrowing and there is no spinal stenosis.   Idiopthic intracranial Hypertension: He was diagnosed with idiopathic intracranial hypertension many years ago and had a VP shunt placed in 2007. A revision was performed July 2007 due to infection. He has a programmable Medtronic valve. Due to the increased headaches, it was reprogrammed from 1.5-1.0. Tapping of the shunt showed a pressure 160 (was drained to 140 mm). There was no infection. He also had an MRI of the brain and MR venogram. CT had shown the left-sided ventricular catheter extends into the right caudate head, similar to the previous study the MR venogram (11/12/2016) was reportedly normal. The left transverse and sigmoid sinuses are not well evaluated due to the shunt reservoir (but also likely he  is right dominant).   CT head 3/13 2020 personally reviewed and no evidence of shunt failure        REVIEW OF SYSTEMS: Constitutional: No fevers, chills, sweats, or change in appetite Eyes: No visual changes, double vision, eye pain Ear, nose and throat: No hearing loss, ear pain, nasal congestion, sore throat Cardiovascular: No chest pain, palpitations Respiratory:  No shortness of breath at rest or with exertion.   No  wheezes GastrointestinaI: No nausea, vomiting, diarrhea, abdominal pain, fecal incontinence Genitourinary:  No dysuria, urinary retention or frequency.  No nocturia. Musculoskeletal:  as above Integumentary: psoriasis on Humira Neurological: as above Psychiatric: No depression at this time.  No anxiety Endocrine: No palpitations, diaphoresis, change in appetite, change in weigh or increased thirst Hematologic/Lymphatic:  No anemia, purpura, petechiae. Allergic/Immunologic: No itchy/runny eyes, nasal congestion, recent allergic reactions, rashes  ALLERGIES: Allergies  Allergen Reactions   Bee Venom Anaphylaxis   Hydrocodone Bit-Homatrop Mbr Other (See Comments)    Hallucinations, confusion, delirium Depressed feeling   Toradol [Ketorolac Tromethamine] Other (See Comments)    Hallucinations, confusion, delirium   Sulfadiazine     NDC QIHK:[id number] NDC IEPP:[id number] NDC YTKZ:[id number]   Morphine And Related Other (See Comments)    Hallucinations, back in the 80s. States has taken vicodin before w/o problems    Sulfa Drugs Cross Reactors Rash    HOME MEDICATIONS:  Current Outpatient Medications:    acetaminophen (TYLENOL) 325 MG tablet, Take 2 tablets (650 mg total) by mouth every 4 (four) hours as needed for headache or mild pain., Disp: , Rfl:    Adalimumab (HUMIRA PEN) 40 MG/0.8ML PNKT, INJECT 40 MG (0.8 ML) UNDER THE SKIN EVERY OTHER WEEK., Disp: 2 each, Rfl: 5   amLODipine (NORVASC) 5 MG tablet, Take 1 tablet (5 mg total) by mouth daily., Disp: 90 tablet, Rfl: 3   aspirin 81 MG chewable tablet, Chew 1 tablet (81 mg total) by mouth daily., Disp: , Rfl:    atorvastatin (LIPITOR) 80 MG tablet, Take 1 tablet (80 mg total) by mouth at bedtime., Disp: 90 tablet, Rfl: 1   carvedilol (COREG) 12.5 MG tablet, TAKE 1 TABLET (12.5 MG TOTAL) BY MOUTH 2 (TWO) TIMES DAILY WITH A MEAL. (Patient taking differently: Take 12.5 mg by mouth 2 (two) times daily with a meal.), Disp: 180  tablet, Rfl: 1   clonazePAM (KLONOPIN) 0.5 MG tablet, TAKE 1 TABLET BY MOUTH EVERY NIGHT AT BEDTIME, Disp: 30 tablet, Rfl: 5   clopidogrel (PLAVIX) 75 MG tablet, TAKE 1 TABLET (75 MG TOTAL) BY MOUTH DAILY., Disp: 90 tablet, Rfl: 3   Continuous Blood Gluc Sensor (FREESTYLE LIBRE 14 DAY SENSOR) MISC, Apply 1 sensor every 14 (fourteen) days., Disp: 2 each, Rfl: 2   DULoxetine (CYMBALTA) 60 MG capsule, TAKE 2 CAPSULES (120 MG TOTAL) BY MOUTH DAILY. (Patient taking differently: Take 60 mg by mouth daily.), Disp: 180 capsule, Rfl: 1   EPINEPHrine (EPIPEN 2-PAK) 0.3 mg/0.3 mL IJ SOAJ injection, Inject 0.3 mLs (0.3 mg total) into the muscle once as needed for up to 1 dose for anaphylaxis., Disp: 2 each, Rfl: 2   fenofibrate micronized (LOFIBRA) 134 MG capsule, TAKE 1 CAPSULE BY MOUTH THREE TIMES WEEKLY (Patient taking differently: Take 134 mg by mouth daily before breakfast.), Disp: 15 capsule, Rfl: 3   gabapentin (NEURONTIN) 600 MG tablet, TAKE 1 TABLET (600 MG TOTAL) BY MOUTH 2 (TWO) TIMES DAILY. (Patient taking differently: Take 600 mg by mouth 2 (two) times daily.), Disp: 180 tablet, Rfl:  1   glimepiride (AMARYL) 4 MG tablet, TAKE 1 TABLET BY MOUTH ONCE DAILY BEFORE BREAKFAST (Patient taking differently: Take 4 mg by mouth daily before breakfast.), Disp: 30 tablet, Rfl: 3   isosorbide mononitrate (IMDUR) 60 MG 24 hr tablet, Take 1 tablet (60 mg total) by mouth daily., Disp: 30 tablet, Rfl: 6   metFORMIN (GLUCOPHAGE) 500 MG tablet, Take 2 tablets (1,000 mg total) by mouth 2 (two) times daily., Disp: 120 tablet, Rfl: 3   nitroGLYCERIN (NITROSTAT) 0.4 MG SL tablet, Place 1 tablet (0.4 mg total) under the tongue every 5 (five) minutes for 3 doses as needed for chest pain., Disp: 25 tablet, Rfl: 6   pantoprazole (PROTONIX) 40 MG tablet, Take 1 tablet (40 mg total) by mouth daily., Disp: 90 tablet, Rfl: 1   Semaglutide, 2 MG/DOSE, (OZEMPIC, 2 MG/DOSE,) 8 MG/3ML SOPN, Inject 2 mg into the skin weekly (Patient  taking differently: Inject 2 mg into the skin once a week. Inject 2 mg into the skin weekly), Disp: 9 mL, Rfl: 2  PAST MEDICAL HISTORY: Past Medical History:  Diagnosis Date   Abnormal cardiac CT angiography    Acid reflux    Annual physical exam 04/08/2015   Arthritis    Atypical chest pain 06/13/2020   Blood transfusion without reported diagnosis    Body mass index (BMI) 35.0-35.9, adult 04/05/2019   Chronic fatigue 01/28/2015   Chronic headaches    on cymbalta   Chronic migraine w/o aura, not intractable, w/o stat migr 10/24/2018   Cirrhosis ([place])    Colon polyps    Complication of anesthesia    problems waking up from anesthesia   Coronary artery disease 01/23/2019   Depression    on cymbalta   Diabetes mellitus with neuropathy ([place])    Diabetes with neuropathy 04/25/2013   Diverticulitis 03/2013   Dyslipidemia 05/29/2019   Eczema    Elevated LFTs    Epidural lipomatosis 10/05/2018   Essential (primary) hypertension 04/05/2019   Essential hypertension 10/10/2019   Fatty liver    GERD (gastroesophageal reflux disease) 04/28/2011   H/O craniotomy 05/07/2015   Headache 04/28/2011   Hepatitis 10/2017   NASH cirrhosis   History of kidney stones    Hyperlipidemia    Hypersomnia with sleep apnea 01/28/2015   Hypertension    IDA (iron deficiency anemia) 01/24/2019   Idiopathic intracranial hypertension 01/14/2017   Insomnia 04/26/2013   Kidney stone    Liver cirrhosis secondary to NASH (nonalcoholic steatohepatitis) ([place]) 01/02/2016   Low back pain 04/05/2019   Lower back injury 08/14/2019   Morbid obesity ([place])    Neuromuscular disorder ([place])    neuropathy   Neuropathy    Nonalcoholic steatohepatitis 0/09/8887   Obstructive hydrocephalus ([place]) 01/28/2015   OSA -- dx ~ 2012, cpap intolerant 09/04/2014    dx ~ 2012, cpap intolerant    PCP NOTES >>> 04/08/2015   Post-op pain 03/19/2019   Post-traumatic hydrocephalus ([place])    s/p shunts x 2 (first got infected )   Presence of  cerebrospinal fluid drainage device 07/28/2011   Psoriasis    sees Dr [name]   Psoriatic arthritis ([place])    REM behavioral disorder 01/14/2017   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   see's Dr.Ortmann   Severe obesity (BMI >= 40) ([place]) 01/28/2015   SI (sacroiliac) joint dysfunction 08/14/2019   Sigmoid diverticulitis 04/25/2013   Sleep apnea    no CPAP      Spondylolisthesis, lumbar region 03/16/2019  Stomach ulcer    Testosterone deficiency 04/28/2011   VP (ventriculoperitoneal) shunt status 07/31/2020    PAST SURGICAL HISTORY: Past Surgical History:  Procedure Laterality Date   AMPUTATION Left 12/27/2021   Procedure: AMPUTATION GREAT TOE;  Surgeon: Trula Slade, DPM;  Location: [facility];  Service: Podiatry;  Laterality: Left;   BACK SURGERY  1980   BRAIN SURGERY     VP shunts placed in 2007   CHOLECYSTECTOMY N/A 08/25/2017   Procedure: LAPAROSCOPIC CHOLECYSTECTOMY WITH INTRAOPERATIVE CHOLANGIOGRAM;  Surgeon: [name], MD;  Location: [facility];  Service: General;  Laterality: N/A;   COLONOSCOPY     CORONARY STENT INTERVENTION N/A 09/27/2020   Procedure: CORONARY STENT INTERVENTION;  Surgeon: Jettie Booze, MD;  Location: [facility];  Service: Cardiovascular;  Laterality: N/A;   INTRAVASCULAR ULTRASOUND/IVUS N/A 09/27/2020   Procedure: Intravascular Ultrasound/IVUS;  Surgeon: Jettie Booze, MD;  Location: [facility];  Service: Cardiovascular;  Laterality: N/A;   JOINT REPLACEMENT     total hip   LEFT HEART CATH N/A 09/27/2020   Procedure: Left Heart Cath;  Surgeon: Jettie Booze, MD;  Location: [facility];  Service: Cardiovascular;  Laterality: N/A;   LEFT HEART CATH AND CORONARY ANGIOGRAPHY N/A 09/24/2020   Procedure: LEFT HEART CATH AND CORONARY ANGIOGRAPHY;  Surgeon: Jettie Booze, MD;  Location: [facility];  Service: Cardiovascular;  Laterality: N/A;   LEFT HEART CATH AND CORONARY ANGIOGRAPHY N/A 08/18/2021    Procedure: LEFT HEART CATH AND CORONARY ANGIOGRAPHY;  Surgeon: Troy Sine, MD;  Location: [facility];  Service: Cardiovascular;  Laterality: N/A;   LUMBAR FUSION  03/16/2019   SHOULDER SURGERY Left 2010   TEE WITHOUT CARDIOVERSION N/A 01/01/2022   Procedure: TRANSESOPHAGEAL ECHOCARDIOGRAM (TEE);  Surgeon: Berniece Salines, DO;  Location: [facility];  Service: Cardiovascular;  Laterality: N/A;   TOE SURGERY Left 2018   TONSILLECTOMY     as a child   TOTAL HIP ARTHROPLASTY Left 2011   UPPER GASTROINTESTINAL ENDOSCOPY  01/04/2020   VENTRICULOPERITONEAL SHUNT  2007   x2    FAMILY HISTORY: Family History  Problem Relation Age of Onset   Other Mother    Lung cancer Father        alive, former smoker    Heart disease Brother        MI age 24   Other Brother        Murdered   Down syndrome Son    Diabetes Neg Hx    Prostate cancer Neg Hx    Colon cancer Neg Hx    Stomach cancer Neg Hx    Pancreatic cancer Neg Hx    Liver disease Neg Hx     SOCIAL HISTORY:  Social History   Socioeconomic History   Marital status: Married    Spouse name: James [name]   Number of children: 2   Years of education: Not on file   Highest education level: Not on file  Occupational History   Occupation: disabled   Tobacco Use   Smoking status: Never   Smokeless tobacco: Never  Vaping Use   Vaping Use: Never used  Substance and Sexual Activity   Alcohol use: No   Drug use: No   Sexual activity: Yes    Partners: Female  Other Topics Concern   Not on file  Social History Narrative   Household-- pt , wife, one adult son with Down's syndrome   younger son  lives in [place] 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   Social Determinants of Health   Financial Resource Strain: Low Risk  (03/18/2021)   Overall Financial Resource Strain (CARDIA)    Difficulty of Paying Living Expenses: Not hard at all  Food Insecurity: No Food Insecurity (01/22/2022)    Hunger Vital Sign    Worried About Running Out of Food in the Last Year: Never true    Ran Out of Food in the Last Year: Never true  Transportation Needs: No Transportation Needs (01/22/2022)   PRAPARE - Hydrologist (Medical): No    Lack of Transportation (Non-Medical): No  Physical Activity: Inactive (03/18/2021)   Exercise Vital Sign    Days of Exercise per Week: 0 days    Minutes of Exercise per Session: 0 min  Stress: No Stress Concern Present (03/18/2021)   [place]    Feeling of Stress : Not at all  Social Connections: Moderately Integrated (03/18/2021)   Social Connection and Isolation Panel [NHANES]    Frequency of Communication with Friends and Family: More than three times a week    Frequency of Social Gatherings with Friends and Family: Once a week    Attends Religious Services: Never    Marine scientist or Organizations: Yes    Attends Archivist Meetings: 1 to 4 times per year    Marital Status: Married  Human resources officer Violence: Not At Risk (01/22/2022)   Humiliation, Afraid, Rape, and Kick questionnaire    Fear of Current or Ex-Partner: No    Emotionally Abused: No    Physically Abused: No    Sexually Abused: No     PHYSICAL EXAM There were no vitals filed for this visit.  There is no height or weight on file to calculate BMI.     General: The patient is well-developed and well-nourished and in no acute distress,     Neck: He has mild tenderness at the occiput.  Good range of motion..    Skin: Extremities are without significant edema.  Neurologic Exam  Mental status: The patient is alert and oriented x 3 at the time of the examination. The patient has apparent normal recent and remote memory, with an apparently normal attention span and concentration ability.   Speech is normal.  Cranial nerves: Extraocular movements are full.  Facial strength  and sensation is normal.  Neck strength is normal.  Motor: 5/5 strength  Coordination: Cerebellar testing shows good finger-nose-finger  Gait and station: Station is normal.  The gait is arthritic.  Tandem gait is wide.  Romberg is negative.     DIAGNOSTIC DATA (LABS, IMAGING, TESTING) - I reviewed patient records, labs, notes, testing and imaging myself where available.  Lab Results  Component Value Date   WBC 6.1 12/31/2021   HGB 13.4 12/31/2021   HCT 40.3 12/31/2021   MCV 87.4 12/31/2021   PLT 208 12/31/2021      Component Value Date/Time   NA 141 01/30/2022 1007   NA 139 09/16/2020 1724   K 4.6 01/30/2022 1007   CL 107 01/30/2022 1007   CO2 26 01/30/2022 1007   GLUCOSE 118 (H) 01/30/2022 1007   BUN 15 01/30/2022 1007   BUN 21 09/16/2020 1724   CREATININE 1.27 01/30/2022 1007   CALCIUM 9.3 01/30/2022 1007   PROT 7.7 01/30/2022 1007   ALBUMIN 4.3 01/30/2022 1007  AST 26 01/30/2022 1007   ALT 25 01/30/2022 1007   ALKPHOS 89 01/30/2022 1007   BILITOT 1.5 (H) 01/30/2022 1007   GFRNONAA 52 (L) 01/01/2022 0315   GFRAA 66 08/08/2020 1442   Lab Results  Component Value Date   CHOL 137 01/30/2022   HDL 42.10 01/30/2022   LDLCALC 68 01/30/2022   LDLDIRECT 67.0 12/31/2020   TRIG 137.0 01/30/2022   CHOLHDL 3 01/30/2022   Lab Results  Component Value Date   HGBA1C 7.1 (H) 01/30/2022   Lab Results  Component Value Date   GKKDPTEL07 615 06/10/2018   Lab Results  Component Value Date   TSH 1.22 06/26/2021       ASSESSMENT AND PLAN  Chronic migraine w/o aura, not intractable, w/o stat migr - Plan: botulinum toxin Type A (BOTOX) injection 200 Units  OSA -- dx ~ 2012, cpap intolerant  Obstructive hydrocephalus (HCC)  Presence of cerebrospinal fluid drainage device  1.  Botox 155 units as follows: Frontalis muscle (5 units x 4), corrugators and procerus (5 units x 3), temporalis (5 units x 8), occipitalis (5 units x 4), splenius capitis (12.5 units x 2),  splenius cervicis (10 units x 2), C6-C7 paraspinal (5 units x 2), trapezius (12.5 units x 2).  45 units wasted 2.    He was unable to tolerate CPAP.  We have discussed the inspire device but he is not interested at this time. 3.   Continue clonazepam 0.5 mg for RBD and insomnia 4.   rtc 6 months, sooner if new or worsening issues.  Ellon Marasco A. Felecia Shelling, MD, [facility] 07/07/3435, 3:57 PM Certified in Neurology, Clinical Neurophysiology, Sleep Medicine, Pain Medicine and Neuroimaging  James [name] [address], [place], [place] [address] [phone]

## 2022-02-02 NOTE — Progress Notes (Signed)
Botox- 200 units x 1 vial Lot: [id number] Expiration: 07/2024 NDC: 0920-0415-93  Bacteriostatic 0.9% Sodium Chloride- 67m total Lot: [id number]: 02/28/2023 NDC: [address]-05 Dx: [id number]/B

## 2022-02-03 ENCOUNTER — Encounter: Payer: Self-pay | Admitting: Endocrinology

## 2022-02-03 ENCOUNTER — Ambulatory Visit (INDEPENDENT_AMBULATORY_CARE_PROVIDER_SITE_OTHER): Payer: 59 | Admitting: Podiatry

## 2022-02-03 ENCOUNTER — Ambulatory Visit (INDEPENDENT_AMBULATORY_CARE_PROVIDER_SITE_OTHER): Payer: 59 | Admitting: Endocrinology

## 2022-02-03 ENCOUNTER — Other Ambulatory Visit (HOSPITAL_BASED_OUTPATIENT_CLINIC_OR_DEPARTMENT_OTHER): Payer: Self-pay

## 2022-02-03 ENCOUNTER — Ambulatory Visit (INDEPENDENT_AMBULATORY_CARE_PROVIDER_SITE_OTHER): Payer: 59

## 2022-02-03 VITALS — BP 112/70 | HR 70 | Ht 64.0 in | Wt 248.6 lb

## 2022-02-03 DIAGNOSIS — E1165 Type 2 diabetes mellitus with hyperglycemia: Secondary | ICD-10-CM

## 2022-02-03 DIAGNOSIS — M86172 Other acute osteomyelitis, left ankle and foot: Secondary | ICD-10-CM | POA: Diagnosis not present

## 2022-02-03 DIAGNOSIS — E782 Mixed hyperlipidemia: Secondary | ICD-10-CM

## 2022-02-03 DIAGNOSIS — S96912A Strain of unspecified muscle and tendon at ankle and foot level, left foot, initial encounter: Secondary | ICD-10-CM

## 2022-02-03 DIAGNOSIS — I200 Unstable angina: Secondary | ICD-10-CM

## 2022-02-03 MED ORDER — GLIMEPIRIDE 4 MG PO TABS
ORAL_TABLET | Freq: Every day | ORAL | 3 refills | Status: DC
  Filled 2022-02-03: qty 30, 30d supply, fill #0
  Filled 2022-02-26 – 2022-03-05 (×2): qty 30, 30d supply, fill #1
  Filled 2022-04-27: qty 30, 30d supply, fill #2
  Filled 2022-05-26: qty 30, 30d supply, fill #3

## 2022-02-03 NOTE — Patient Instructions (Addendum)
Drink only 6 oz coke, less High Carb foods  Set alert on Calender for weekly Ozempic

## 2022-02-03 NOTE — Progress Notes (Signed)
Patient ID: James [name], male   DOB: 1956-02-12, 66 y.o.   MRN: [id number]           Reason for Appointment: Follow-up for Type 2 Diabetes   History of Present Illness:          Date of diagnosis of type 2 diabetes mellitus:  ?  2014      Background history:  He is not clear how his diabetes was diagnosed, likely on routine testing. Initially had been treated with metformin and also tried on Amaryl Patient had progressive increase in his blood sugars since 1/17 with stopping metformin and being on the regimen of Amaryl and Januvia, he thinks his blood sugars went up to 601.  He was then started on basal bolus insulin   Lowest A1c was 6.8 previously  OMNIPOD insulin pump settings as follows Basal rates: 12 AM-6 AM = 0.1, 6 AM-11 AM = 0.5, 11 AM- 9:30 PM = 1.0 and 9:30 PM-12 AM = 1.2 Correction 1: 50 carbohydrate coverage 1: 1   Recent history:    Non-insulin hypoglycemic drugs the patient is taking are:  metformin 1000 mg a.m., 500 mg p.m. daily, Ozempic 2 mg weekly, Amaryl 4 mg daily  His A1c is gone up to 7.1, previously was at 5.9   Current management, blood sugar patterns and problems identified:  Even though he was reminded to take his Ozempic regularly and set up reminder system on his cell phone or otherwise he has forgotten to take this at least for the last 6 weeks  He says he has not been able to do much physical activity because of being hospitalized  Recently however has not gained any weight gained 20 pounds since his last visit his blood sugar control is relatively better Was last seen in 5/23 He was admitted for osteomyelitis of his toe in late June and he thinks his blood sugars were higher during the hospitalization Although he was having some hypoglycemia on his sensor overnight in the first week of the data he was not symptomatic and did not compare his fingersticks with the Elenor Legato He is still not watching his diet and periodically eating sweets or  high fat meals Also will usually have 1 regular soft drink of 12 ounces once a day and sometimes drinking Powerade He does not like diet drinks Because of this his blood sugars are mostly higher after dinner but usually come down significantly overnight  INTERPRETATION of the freestyle libre sensor download for the last 2 weeks as follows  His blood sugars appear to be relatively higher in the second week compared to the first week of visit Also data on some days is incomplete HYPERGLYCEMIA occur is periodically postprandially at all times especially after evening meal around 9-10 PM OVERNIGHT blood sugars are inconsistent with generally low normal or slightly low readings in the first week and variable readings in the last week including a couple of days of mild hyperglycemia POSTPRANDIAL readings are periodically higher after both lunch and dinner and only occasionally after breakfast However patterns are inconsistent highest readings are after dinner  CGM use % of time 74  2-week average/GV 141/38  Time in range    74    %  % Time Above 180 21+4  % Time above 250   % Time Below 70 9     PRE-MEAL Fasting Lunch Dinner Bedtime Overall  Glucose range:       Averages:  141   POST-MEAL PC Breakfast PC Lunch PC Dinner  Glucose range:     Averages: 143 178 187   Previously:  Blood sugar AVERAGES from his libre on that to hourly basis showing LOWEST average of 106 early morning and highest 193 late at night Overall AVERAGE 135   Side  effects from medications have been: None  Compliance with the medical regimen: Fair  Glucose monitoring:   Glucometer:  Freestyle libre 14-day   Self-care:   Meal times are:  Breakfast variable, lunch: 12-3 PM Dinner: 6 PM    Dietician visit, most recent:09/2015               CDE consultation: 12/2018  Weight history:  Wt Readings from Last 3 Encounters:  02/03/22 248 lb 9.6 oz (112.8 kg)  01/09/22 247 lb (112 kg)  12/27/21 250 lb  (113.4 kg)    Glycemic control:   Lab Results  Component Value Date   HGBA1C 7.1 (H) 01/30/2022   HGBA1C 5.9 10/30/2021   HGBA1C 6.5 (H) 08/15/2021   Lab Results  Component Value Date   MICROALBUR 2.1 (H) 01/30/2022   LDLCALC 68 01/30/2022   CREATININE 1.27 01/30/2022    Lab on 01/30/2022  Component Date Value Ref Range Status   Microalb, Ur 01/30/2022 2.1 (H)  0.0 - 1.9 mg/dL Final   Creatinine,U 01/30/2022 137.6  mg/dL Final   Microalb Creat Ratio 01/30/2022 1.5  0.0 - 30.0 mg/g Final   Cholesterol 01/30/2022 137  0 - 200 mg/dL Final   ATP III Classification       Desirable:  < 200 mg/dL               Borderline High:  200 - 239 mg/dL          High:  > = 240 mg/dL   Triglycerides 01/30/2022 137.0  0.0 - 149.0 mg/dL Final   Normal:  <150 mg/dLBorderline High:  150 - 199 mg/dL   HDL 01/30/2022 42.10  >39.00 mg/dL Final   VLDL 01/30/2022 27.4  0.0 - 40.0 mg/dL Final   LDL Cholesterol 01/30/2022 68  0 - 99 mg/dL Final   Total CHOL/HDL Ratio 01/30/2022 3   Final                  Men          Women1/2 Average Risk     3.4          3.3Average Risk          5.0          4.42X Average Risk          9.6          7.13X Average Risk          15.0          11.0                       NonHDL 01/30/2022 94.98   Final   NOTE:  Non-HDL goal should be 30 mg/dL higher than patient's LDL goal (i.e. LDL goal of < 70 mg/dL, would have non-HDL goal of < 100 mg/dL)   Hgb A1c MFr Bld 01/30/2022 7.1 (H)  4.6 - 6.5 % Final   Glycemic Control Guidelines for People with Diabetes:Non Diabetic:  <6%Goal of Therapy: <7%Additional Action Suggested:  >8%    Sodium 01/30/2022 141  135 - 145 mEq/L Final   Potassium 01/30/2022 4.6  3.5 - 5.1 mEq/L Final   Chloride 01/30/2022 107  96 - 112 mEq/L Final   CO2 01/30/2022 26  19 - 32 mEq/L Final   Glucose, Bld 01/30/2022 118 (H)  70 - 99 mg/dL Final   BUN 01/30/2022 15  6 - 23 mg/dL Final   Creatinine, Ser 01/30/2022 1.27  0.40 - 1.50 mg/dL Final   Total Bilirubin  01/30/2022 1.5 (H)  0.2 - 1.2 mg/dL Final   Alkaline Phosphatase 01/30/2022 89  39 - 117 U/L Final   AST 01/30/2022 26  0 - 37 U/L Final   ALT 01/30/2022 25  0 - 53 U/L Final   Total Protein 01/30/2022 7.7  6.0 - 8.3 g/dL Final   Albumin 01/30/2022 4.3  3.5 - 5.2 g/dL Final   GFR 01/30/2022 59.96 (L)  >60.00 mL/min Final   Calculated using the CKD-EPI Creatinine Equation (2021)   Calcium 01/30/2022 9.3  8.4 - 10.5 mg/dL Final       Allergies as of 02/03/2022       Reactions   Bee Venom Anaphylaxis   Hydrocodone Bit-homatrop Mbr Other (See Comments)   Hallucinations, confusion, delirium Depressed feeling   Toradol [ketorolac Tromethamine] Other (See Comments)   Hallucinations, confusion, delirium   Sulfadiazine    NDC NFAO:[id number] NDC GEXB:[id number] NDC UVOZ:[id number]   Morphine And Related Other (See Comments)   Hallucinations, back in the 80s. States has taken vicodin before w/o problems    Sulfa Drugs Cross Reactors Rash        Medication List        Accurate as of February 03, 2022  1:29 PM. If you have any questions, ask your nurse or doctor.          acetaminophen 325 MG tablet Commonly known as: TYLENOL Take 2 tablets (650 mg total) by mouth every 4 (four) hours as needed for headache or mild pain.   amLODipine 5 MG tablet Commonly known as: NORVASC Take 1 tablet (5 mg total) by mouth daily.   aspirin 81 MG chewable tablet Chew 1 tablet (81 mg total) by mouth daily.   atorvastatin 80 MG tablet Commonly known as: LIPITOR Take 1 tablet (80 mg total) by mouth at bedtime.   carvedilol 12.5 MG tablet Commonly known as: COREG TAKE 1 TABLET (12.5 MG TOTAL) BY MOUTH 2 (TWO) TIMES DAILY WITH A MEAL. What changed: how much to take   clonazePAM 0.5 MG tablet Commonly known as: KLONOPIN TAKE 1 TABLET BY MOUTH EVERY NIGHT AT BEDTIME   clopidogrel 75 MG tablet Commonly known as: PLAVIX TAKE 1 TABLET (75 MG TOTAL) BY MOUTH DAILY.   DULoxetine 60 MG  capsule Commonly known as: CYMBALTA TAKE 2 CAPSULES (120 MG TOTAL) BY MOUTH DAILY. What changed:  how much to take how to take this when to take this   EPINEPHrine 0.3 mg/0.3 mL Soaj injection Commonly known as: EpiPen 2-Pak Inject 0.3 mLs (0.3 mg total) into the muscle once as needed for up to 1 dose for anaphylaxis.   fenofibrate micronized 134 MG capsule Commonly known as: LOFIBRA TAKE 1 CAPSULE BY MOUTH THREE TIMES WEEKLY What changed:  how much to take how to take this when to take this   FreeStyle Libre 14 Day Sensor Misc Apply 1 sensor every 14 (fourteen) days.   gabapentin 600 MG tablet Commonly known as: NEURONTIN TAKE 1 TABLET (600 MG TOTAL) BY MOUTH 2 (TWO) TIMES DAILY. What changed:  how much to take how to take this  when to take this   glimepiride 4 MG tablet Commonly known as: AMARYL TAKE 1 TABLET BY MOUTH ONCE DAILY BEFORE BREAKFAST What changed: how much to take   Humira Pen 40 MG/0.8ML Pnkt Generic drug: Adalimumab INJECT 40 MG (0.8 ML) UNDER THE SKIN EVERY OTHER WEEK.   isosorbide mononitrate 60 MG 24 hr tablet Commonly known as: IMDUR Take 1 tablet (60 mg total) by mouth daily.   metFORMIN 500 MG tablet Commonly known as: GLUCOPHAGE Take 2 tablets (1,000 mg total) by mouth 2 (two) times daily.   nitroGLYCERIN 0.4 MG SL tablet Commonly known as: NITROSTAT Place 1 tablet (0.4 mg total) under the tongue every 5 (five) minutes for 3 doses as needed for chest pain.   Ozempic (2 MG/DOSE) 8 MG/3ML Sopn Generic drug: Semaglutide (2 MG/DOSE) Inject 2 mg into the skin weekly What changed:  how much to take how to take this when to take this   pantoprazole 40 MG tablet Commonly known as: PROTONIX Take 1 tablet (40 mg total) by mouth daily.        Allergies:  Allergies  Allergen Reactions   Bee Venom Anaphylaxis   Hydrocodone Bit-Homatrop Mbr Other (See Comments)    Hallucinations, confusion, delirium Depressed feeling   Toradol  [Ketorolac Tromethamine] Other (See Comments)    Hallucinations, confusion, delirium   Sulfadiazine     NDC JSEG:[id number] NDC XTGG:[id number] NDC JJKK:[id number]   Morphine And Related Other (See Comments)    Hallucinations, back in the 80s. States has taken vicodin before w/o problems    Sulfa Drugs Cross Reactors Rash    Past Medical History:  Diagnosis Date   Abnormal cardiac CT angiography    Acid reflux    Annual physical exam 04/08/2015   Arthritis    Atypical chest pain 06/13/2020   Blood transfusion without reported diagnosis    Body mass index (BMI) 35.0-35.9, adult 04/05/2019   Chronic fatigue 01/28/2015   Chronic headaches    on cymbalta   Chronic migraine w/o aura, not intractable, w/o stat migr 10/24/2018   Cirrhosis ([place])    Colon polyps    Complication of anesthesia    problems waking up from anesthesia   Coronary artery disease 01/23/2019   Depression    on cymbalta   Diabetes mellitus with neuropathy ([place])    Diabetes with neuropathy 04/25/2013   Diverticulitis 03/2013   Dyslipidemia 05/29/2019   Eczema    Elevated LFTs    Epidural lipomatosis 10/05/2018   Essential (primary) hypertension 04/05/2019   Essential hypertension 10/10/2019   Fatty liver    GERD (gastroesophageal reflux disease) 04/28/2011   H/O craniotomy 05/07/2015   Headache 04/28/2011   Hepatitis 10/2017   NASH cirrhosis   History of kidney stones    Hyperlipidemia    Hypersomnia with sleep apnea 01/28/2015   Hypertension    IDA (iron deficiency anemia) 01/24/2019   Idiopathic intracranial hypertension 01/14/2017   Insomnia 04/26/2013   Kidney stone    Liver cirrhosis secondary to NASH (nonalcoholic steatohepatitis) ([place]) 01/02/2016   Low back pain 04/05/2019   Lower back injury 08/14/2019   Morbid obesity ([place])    Neuromuscular disorder ([place])    neuropathy   Neuropathy    Nonalcoholic steatohepatitis 12/06/6787   Obstructive hydrocephalus ([place]) 01/28/2015   OSA -- dx ~ 2012, cpap  intolerant 09/04/2014    dx ~ 2012, cpap intolerant    PCP NOTES >>> 04/08/2015   Post-op pain 03/19/2019   Post-traumatic hydrocephalus ([place])  s/p shunts x 2 (first got infected )   Presence of cerebrospinal fluid drainage device 07/28/2011   Psoriasis    sees Dr [name]   Psoriatic arthritis ([place])    REM behavioral disorder 01/14/2017   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   see's Dr.Ortmann   Severe obesity (BMI >= 40) ([place]) 01/28/2015   SI (sacroiliac) joint dysfunction 08/14/2019   Sigmoid diverticulitis 04/25/2013   Sleep apnea    no CPAP      Spondylolisthesis, lumbar region 03/16/2019   Stomach ulcer    Testosterone deficiency 04/28/2011   VP (ventriculoperitoneal) shunt status 07/31/2020    Past Surgical History:  Procedure Laterality Date   AMPUTATION Left 12/27/2021   Procedure: AMPUTATION GREAT TOE;  Surgeon: Trula Slade, DPM;  Location: [facility];  Service: Podiatry;  Laterality: Left;   BACK SURGERY  1980   BRAIN SURGERY     VP shunts placed in 2007   CHOLECYSTECTOMY N/A 08/25/2017   Procedure: LAPAROSCOPIC CHOLECYSTECTOMY WITH INTRAOPERATIVE CHOLANGIOGRAM;  Surgeon: [name], MD;  Location: [place];  Service: General;  Laterality: N/A;   COLONOSCOPY     CORONARY STENT INTERVENTION N/A 09/27/2020   Procedure: CORONARY STENT INTERVENTION;  Surgeon: Jettie Booze, MD;  Location: [place] [facility];  Service: Cardiovascular;  Laterality: N/A;   INTRAVASCULAR ULTRASOUND/IVUS N/A 09/27/2020   Procedure: Intravascular Ultrasound/IVUS;  Surgeon: Jettie Booze, MD;  Location: [place] [facility];  Service: Cardiovascular;  Laterality: N/A;   JOINT REPLACEMENT     total hip   LEFT HEART CATH N/A 09/27/2020   Procedure: Left Heart Cath;  Surgeon: Jettie Booze, MD;  Location: [facility];  Service: Cardiovascular;  Laterality: N/A;   LEFT HEART CATH AND CORONARY ANGIOGRAPHY N/A 09/24/2020   Procedure: LEFT HEART CATH AND CORONARY  ANGIOGRAPHY;  Surgeon: Jettie Booze, MD;  Location: [place] [facility];  Service: Cardiovascular;  Laterality: N/A;   LEFT HEART CATH AND CORONARY ANGIOGRAPHY N/A 08/18/2021   Procedure: LEFT HEART CATH AND CORONARY ANGIOGRAPHY;  Surgeon: Troy Sine, MD;  Location: [facility];  Service: Cardiovascular;  Laterality: N/A;   LUMBAR FUSION  03/16/2019   SHOULDER SURGERY Left 2010   TEE WITHOUT CARDIOVERSION N/A 01/01/2022   Procedure: TRANSESOPHAGEAL ECHOCARDIOGRAM (TEE);  Surgeon: Berniece Salines, DO;  Location: [facility];  Service: Cardiovascular;  Laterality: N/A;   TOE SURGERY Left 2018   TONSILLECTOMY     as a child   TOTAL HIP ARTHROPLASTY Left 2011   UPPER GASTROINTESTINAL ENDOSCOPY  01/04/2020   VENTRICULOPERITONEAL SHUNT  2007   x2    Family History  Problem Relation Age of Onset   Other Mother    Lung cancer Father        alive, former smoker    Heart disease Brother        MI age 20   Other Brother        Murdered   Down syndrome Son    Diabetes Neg Hx    Prostate cancer Neg Hx    Colon cancer Neg Hx    Stomach cancer Neg Hx    Pancreatic cancer Neg Hx    Liver disease Neg Hx     Social History:  reports that he has never smoked. He has never used smokeless tobacco. He reports that he does not drink alcohol and does not use drugs.    Review of Systems  HYPERTENSION: On carvedilol with good control Hypertension managed by his PCP   BP Readings from Last 3 Encounters:  02/03/22 112/70  01/09/22 108/70  01/01/22 114/78   RENAL dysfunction: Has had mildly increased creatinine levels, relatively stable No history of microalbuminuria  Lab Results  Component Value Date   CREATININE 1.27 01/30/2022   CREATININE 1.51 (H) 01/01/2022   CREATININE 1.52 (H) 12/31/2021    Lipid history: He is on Lipitor  20 mg  Is on fenofibrate 3 times a week for high triglycerides,   LDL is below 70 and triglycerides are normal    Lab Results   Component Value Date   CHOL 137 01/30/2022   CHOL 118 08/16/2021   CHOL 139 03/04/2021   Lab Results  Component Value Date   HDL 42.10 01/30/2022   HDL 43 08/16/2021   HDL 39.90 03/04/2021   Lab Results  Component Value Date   LDLCALC 68 01/30/2022   LDLCALC 55 08/16/2021   LDLCALC 69 03/04/2021   Lab Results  Component Value Date   TRIG 137.0 01/30/2022   TRIG 101 08/16/2021   TRIG 151.0 (H) 03/04/2021   Lab Results  Component Value Date   CHOLHDL 3 01/30/2022   CHOLHDL 2.7 08/16/2021   CHOLHDL 3 03/04/2021   Lab Results  Component Value Date   LDLDIRECT 67.0 12/31/2020   LDLDIRECT 91.0 06/11/2020   LDLDIRECT 83.0 10/09/2019            He is being followed by gastroenterology for liver cirrhosis secondary to [facility]  Lab Results  Component Value Date   ALT 25 01/30/2022     He has symptomatic painful neuropathy and is on gabapentin 600 mg twice a day and  Cymbalta 60 mg  Has been followed by gastroenterologist for NASH  Lab Results  Component Value Date   ALT 25 01/30/2022     Review of Systems    Physical Examination:  BP 112/70   Pulse 70   Ht 5' 4"  (1.626 m)   Wt 248 lb 9.6 oz (112.8 kg)   SpO2 94%   BMI 42.67 kg/m       ASSESSMENT:  Diabetes type 2 with obesity  See history of present illness for discussion of current diabetes management, blood sugar patterns and problems identified  Currently on a regimen of AMARYL, Ozempic 1 mg and Metformin 1000 mg a.m. and 500 mg p.m. daily  A1c is relatively higher at 7.1  His blood sugar patterns were reviewed from his freestyle [employer] Again not clear how accurate this is because of possibly falsely low readings overnight in the first week  Blood sugars are mostly high postprandially based on his diet and having regular soft drinks Recently not active but has maintained his weight  PLAN: Discussed importance of taking Ozempic for better control especially postprandial  hyperglycemia Again reminded him to set up a reminder on his calendar on the phone or otherwise to make sure he takes the Ozempic weekly on the same day Also offered him the option of taking Victoza daily for easier routine but he refuses to do this He does need to continue metformin unless blood sugars are low normal overnight He needs to stop or reduce drinking regular soft drinks and drink more water Discussed blood sugar targets after meals Likely may need to reduce his glimepiride if blood sugars are getting low normal or low This may occur if he is able to improve his diet and eliminate more soft  drinks Encouraged him to start more walking or other exercise as tolerated Balanced meals with some protein at each meal   LIPIDS: Well-controlled  Urine microalbumin normal  Patient Instructions  Drink only 6 oz coke, less High Carb foods  Set alert on Calender for weekly Ozempic      Elayne Snare 02/03/2022, 1:29 PM

## 2022-02-05 ENCOUNTER — Encounter: Payer: Self-pay | Admitting: Endocrinology

## 2022-02-06 ENCOUNTER — Other Ambulatory Visit (HOSPITAL_BASED_OUTPATIENT_CLINIC_OR_DEPARTMENT_OTHER): Payer: Self-pay

## 2022-02-06 ENCOUNTER — Other Ambulatory Visit: Payer: Self-pay | Admitting: Internal Medicine

## 2022-02-09 ENCOUNTER — Other Ambulatory Visit (HOSPITAL_BASED_OUTPATIENT_CLINIC_OR_DEPARTMENT_OTHER): Payer: Self-pay

## 2022-02-09 ENCOUNTER — Other Ambulatory Visit (HOSPITAL_COMMUNITY): Payer: Self-pay

## 2022-02-09 ENCOUNTER — Ambulatory Visit (INDEPENDENT_AMBULATORY_CARE_PROVIDER_SITE_OTHER): Payer: 59 | Admitting: Podiatry

## 2022-02-09 DIAGNOSIS — M86172 Other acute osteomyelitis, left ankle and foot: Secondary | ICD-10-CM

## 2022-02-09 DIAGNOSIS — S96912A Strain of unspecified muscle and tendon at ankle and foot level, left foot, initial encounter: Secondary | ICD-10-CM

## 2022-02-09 DIAGNOSIS — I200 Unstable angina: Secondary | ICD-10-CM

## 2022-02-09 MED ORDER — GABAPENTIN 600 MG PO TABS
ORAL_TABLET | ORAL | 1 refills | Status: DC
  Filled 2022-02-09: qty 180, 90d supply, fill #0
  Filled 2022-02-26 – 2022-03-05 (×2): qty 180, 90d supply, fill #1

## 2022-02-10 ENCOUNTER — Encounter: Payer: 59 | Admitting: Podiatry

## 2022-02-10 LAB — CBC WITH DIFFERENTIAL/PLATELET
Basophils Absolute: 0 10*3/uL (ref 0.0–0.2)
Basos: 1 %
EOS (ABSOLUTE): 0.1 10*3/uL (ref 0.0–0.4)
Eos: 3 %
Hematocrit: 36.6 % — ABNORMAL LOW (ref 37.5–51.0)
Hemoglobin: 12 g/dL — ABNORMAL LOW (ref 13.0–17.7)
Immature Grans (Abs): 0 10*3/uL (ref 0.0–0.1)
Immature Granulocytes: 0 %
Lymphocytes Absolute: 1.4 10*3/uL (ref 0.7–3.1)
Lymphs: 34 %
MCH: 28.8 pg (ref 26.6–33.0)
MCHC: 32.8 g/dL (ref 31.5–35.7)
MCV: 88 fL (ref 79–97)
Monocytes Absolute: 0.3 10*3/uL (ref 0.1–0.9)
Monocytes: 7 %
Neutrophils Absolute: 2.3 10*3/uL (ref 1.4–7.0)
Neutrophils: 55 %
Platelets: 174 10*3/uL (ref 150–450)
RBC: 4.16 x10E6/uL (ref 4.14–5.80)
RDW: 15.8 % — ABNORMAL HIGH (ref 11.6–15.4)
WBC: 4.1 10*3/uL (ref 3.4–10.8)

## 2022-02-10 LAB — C-REACTIVE PROTEIN: CRP: 5 mg/L (ref 0–10)

## 2022-02-10 LAB — SEDIMENTATION RATE: Sed Rate: 26 mm/hr (ref 0–30)

## 2022-02-10 NOTE — Progress Notes (Signed)
Subjective: James [name] is a 66 y.o. is seen today in office s/p left hallux partial toe amputation preformed on 12/27/2021.  He states the toe itself is doing well he has new concerns of his second toe staying more straight he has some bruising.  Previous had a hammertoe but he states the toe is now straight.  He is does not recall any injury.  No open lesions that he reports.   Objective: General: No acute distress, AAOx3  DP/PT pulses palpable, CRT < 3 sec to all digits.  Left foot: Incision is well coapted without any evidence of dehiscence and scar is formed.  There is mild edema there is no residual erythema there is no ascending cellulitis.  Today the second toe does sit in a more rectus position there is mild swelling.  No pain on exam and no area pinpoint tenderness.  No other open lesions or pre-ulcerative lesions.  No pain with calf compression, swelling, warmth, erythema.   Assessment and Plan:  Status post left hallux amputation; likely flexor tendon rupture second toe  -Treatment options discussed including all alternatives, risks, and complications -X-rays obtained reviewed of the left foot.  3 views were obtained.  No evidence of acute fracture.  Previous partial hallux amputation -Recommend return to the surgical shoe, elevation. -No signs of infection today and there is no open lesions however discussed daily foot inspection.  Trula Slade DPM

## 2022-02-11 NOTE — Progress Notes (Signed)
Subjective: James [name] is a 66 y.o. is seen today in office s/p left hallux partial toe amputation preformed on 12/27/2021 and for possible second toe injury.  He is not able to tolerate the surgical shoe.  Second toe is still straight.  Intermittent swelling he reports.  But no redness or warmth.  No open lesions.    Objective: General: No acute distress, AAOx3  DP/PT pulses palpable, CRT < 3 sec to all digits.  Left foot: Incision is well coapted without any evidence of dehiscence and scar is formed.  Second toe is rectus.  There is still some minimal edema but there is no erythema or warmth.  There is mild discomfort in the arch of the foot but there is no erythema or signs of infection of this area.  Likely from a flexor tear.  Ankle joint range of motion intact. No pain with calf compression, swelling, warmth, erythema.   Assessment and Plan:  Status post left hallux amputation; likely flexor tendon rupture second toe  -Treatment options discussed including all alternatives, risks, and complications -Not able to tolerate surgical shoe or immobilization.  Continue with stiff soled shoe.  I do monitor blood pressures to rule out infection.  This was ordered today.  Compression, elevation.  Trula Slade DPM

## 2022-02-12 ENCOUNTER — Encounter: Payer: Self-pay | Admitting: Endocrinology

## 2022-02-16 ENCOUNTER — Other Ambulatory Visit (HOSPITAL_COMMUNITY): Payer: Self-pay

## 2022-02-20 ENCOUNTER — Other Ambulatory Visit (HOSPITAL_BASED_OUTPATIENT_CLINIC_OR_DEPARTMENT_OTHER): Payer: Self-pay

## 2022-02-24 DIAGNOSIS — M5416 Radiculopathy, lumbar region: Secondary | ICD-10-CM | POA: Diagnosis not present

## 2022-02-26 ENCOUNTER — Other Ambulatory Visit: Payer: Self-pay | Admitting: Endocrinology

## 2022-02-26 ENCOUNTER — Other Ambulatory Visit: Payer: Self-pay | Admitting: Internal Medicine

## 2022-02-26 DIAGNOSIS — E1165 Type 2 diabetes mellitus with hyperglycemia: Secondary | ICD-10-CM

## 2022-02-27 ENCOUNTER — Other Ambulatory Visit (HOSPITAL_BASED_OUTPATIENT_CLINIC_OR_DEPARTMENT_OTHER): Payer: Self-pay

## 2022-02-27 MED ORDER — FREESTYLE LIBRE 14 DAY SENSOR MISC
1.0000 [IU] | 2 refills | Status: DC
  Filled 2022-02-27 – 2022-03-23 (×3): qty 2, 28d supply, fill #0
  Filled 2022-05-28: qty 2, 28d supply, fill #1

## 2022-02-27 MED ORDER — CARVEDILOL 12.5 MG PO TABS
12.5000 mg | ORAL_TABLET | Freq: Two times a day (BID) | ORAL | 0 refills | Status: DC
  Filled 2022-02-27: qty 60, 30d supply, fill #0

## 2022-03-05 ENCOUNTER — Other Ambulatory Visit: Payer: Self-pay | Admitting: Internal Medicine

## 2022-03-05 ENCOUNTER — Other Ambulatory Visit (HOSPITAL_BASED_OUTPATIENT_CLINIC_OR_DEPARTMENT_OTHER): Payer: Self-pay

## 2022-03-06 ENCOUNTER — Other Ambulatory Visit (HOSPITAL_BASED_OUTPATIENT_CLINIC_OR_DEPARTMENT_OTHER): Payer: Self-pay

## 2022-03-06 ENCOUNTER — Encounter: Payer: Self-pay | Admitting: Internal Medicine

## 2022-03-09 ENCOUNTER — Other Ambulatory Visit (HOSPITAL_COMMUNITY): Payer: Self-pay

## 2022-03-11 ENCOUNTER — Other Ambulatory Visit (HOSPITAL_BASED_OUTPATIENT_CLINIC_OR_DEPARTMENT_OTHER): Payer: Self-pay

## 2022-03-12 ENCOUNTER — Other Ambulatory Visit (HOSPITAL_COMMUNITY): Payer: Self-pay

## 2022-03-18 ENCOUNTER — Telehealth: Payer: Self-pay | Admitting: Internal Medicine

## 2022-03-18 NOTE — Telephone Encounter (Signed)
Patient's wife called to advise that he received letter for [facility] Annual Wellness Visit but he should not be receiving it as he has had [name] for the last 14 years as his primary insurance. Advised I would make a note in his chart.

## 2022-03-23 ENCOUNTER — Encounter: Payer: Self-pay | Admitting: Internal Medicine

## 2022-03-23 ENCOUNTER — Ambulatory Visit (INDEPENDENT_AMBULATORY_CARE_PROVIDER_SITE_OTHER): Payer: 59 | Admitting: Internal Medicine

## 2022-03-23 ENCOUNTER — Other Ambulatory Visit (HOSPITAL_BASED_OUTPATIENT_CLINIC_OR_DEPARTMENT_OTHER): Payer: Self-pay

## 2022-03-23 ENCOUNTER — Ambulatory Visit (INDEPENDENT_AMBULATORY_CARE_PROVIDER_SITE_OTHER): Payer: 59 | Admitting: *Deleted

## 2022-03-23 VITALS — BP 116/64 | HR 70 | Temp 98.3°F | Resp 18 | Ht 71.0 in | Wt 242.1 lb

## 2022-03-23 DIAGNOSIS — Z0000 Encounter for general adult medical examination without abnormal findings: Secondary | ICD-10-CM

## 2022-03-23 DIAGNOSIS — G4733 Obstructive sleep apnea (adult) (pediatric): Secondary | ICD-10-CM

## 2022-03-23 DIAGNOSIS — B9561 Methicillin susceptible Staphylococcus aureus infection as the cause of diseases classified elsewhere: Secondary | ICD-10-CM | POA: Diagnosis not present

## 2022-03-23 DIAGNOSIS — I10 Essential (primary) hypertension: Secondary | ICD-10-CM

## 2022-03-23 DIAGNOSIS — I200 Unstable angina: Secondary | ICD-10-CM

## 2022-03-23 DIAGNOSIS — R7881 Bacteremia: Secondary | ICD-10-CM

## 2022-03-23 DIAGNOSIS — Z23 Encounter for immunization: Secondary | ICD-10-CM

## 2022-03-23 DIAGNOSIS — F32A Depression, unspecified: Secondary | ICD-10-CM | POA: Diagnosis not present

## 2022-03-23 DIAGNOSIS — Z982 Presence of cerebrospinal fluid drainage device: Secondary | ICD-10-CM | POA: Diagnosis not present

## 2022-03-23 DIAGNOSIS — R42 Dizziness and giddiness: Secondary | ICD-10-CM | POA: Diagnosis not present

## 2022-03-23 MED ORDER — ATORVASTATIN CALCIUM 80 MG PO TABS
80.0000 mg | ORAL_TABLET | Freq: Every day | ORAL | 1 refills | Status: DC
  Filled 2022-03-23: qty 90, 90d supply, fill #0
  Filled 2022-04-14 – 2022-06-18 (×2): qty 90, 90d supply, fill #1

## 2022-03-23 MED ORDER — PANTOPRAZOLE SODIUM 40 MG PO TBEC
40.0000 mg | DELAYED_RELEASE_TABLET | Freq: Every day | ORAL | 1 refills | Status: DC
  Filled 2022-03-23: qty 90, 90d supply, fill #0
  Filled 2022-06-18: qty 90, 90d supply, fill #1

## 2022-03-23 MED ORDER — GABAPENTIN 600 MG PO TABS
600.0000 mg | ORAL_TABLET | Freq: Two times a day (BID) | ORAL | 1 refills | Status: DC
  Filled 2022-03-23 – 2022-05-26 (×2): qty 180, 90d supply, fill #0
  Filled 2022-08-06 (×2): qty 180, 90d supply, fill #1

## 2022-03-23 MED ORDER — CARVEDILOL 12.5 MG PO TABS
12.5000 mg | ORAL_TABLET | Freq: Two times a day (BID) | ORAL | 0 refills | Status: DC
  Filled 2022-03-23: qty 60, 30d supply, fill #0

## 2022-03-23 MED ORDER — DULOXETINE HCL 60 MG PO CPEP
120.0000 mg | ORAL_CAPSULE | Freq: Every day | ORAL | 1 refills | Status: DC
  Filled 2022-03-23: qty 180, 90d supply, fill #0
  Filled 2022-06-18: qty 180, 90d supply, fill #1

## 2022-03-23 NOTE — Patient Instructions (Addendum)
Be sure you take aspirin 81 mg every day  Vaccines I recommend:  Shingrix (shingles) Covid booster RSV vaccine Flu shot  Rest and drink plenty of fluids If you have severe dizziness or a major headache: Seek medical attention.  Check the  blood pressure regularly BP GOAL is between 110/65 and  135/85. If it is consistently higher or lower, let me know    [place] back for a checkup in 4 months   Per our records you are due for your diabetic eye exam. Please contact your eye doctor to schedule an appointment. Please have them send copies of your office visit notes to [place]. Our fax number is [phone]. If you need a referral to an eye doctor please let us know.

## 2022-03-23 NOTE — Progress Notes (Cosign Needed)
Subjective:   James [name] is a 66 y.o. male who presents for Medicare Annual/Subsequent preventive examination.  I connected with  James [name] on 03/23/22 by a audio enabled telemedicine application and verified that I am speaking with the correct person using two identifiers.  Patient Location: Home  Provider Location: Office/Clinic  I discussed the limitations of evaluation and management by telemedicine. The patient expressed understanding and agreed to proceed.   Review of Systems    Defer to PCP Cardiac Risk Factors include: advanced age (>81mn, >>34women);diabetes mellitus;dyslipidemia;hypertension;male gender;sedentary lifestyle     Objective:    There were no vitals filed for this visit. There is no height or weight on file to calculate BMI.     03/23/2022    2:24 PM 12/25/2021    9:38 PM 12/25/2021    4:48 PM 08/15/2021    6:00 PM 08/15/2021   11:10 AM 03/18/2021   11:03 AM 09/27/2020    4:10 PM  Advanced Directives  Does Patient Have a Medical Advance Directive? Yes  No No No Yes Yes  Type of AParamedicof [place]olinasLiving will     [place]antLiving will [place]andenberg VillageLiving will  Does patient want to make changes to medical advance directive? No - Patient declined      No - Patient declined  Copy of [place] Chart? No - copy requested     No - copy requested No - copy requested  Would patient like information on creating a medical advance directive?  No - Patient declined  No - Patient declined No - Patient declined      Current Medications (verified) Outpatient Encounter Medications as of 03/23/2022  Medication Sig   acetaminophen (TYLENOL) 325 MG tablet Take 2 tablets (650 mg total) by mouth every 4 (four) hours as needed for headache or mild pain.   Adalimumab (HUMIRA PEN) 40 MG/0.8ML PNKT INJECT 40 MG (0.8 ML) UNDER THE SKIN EVERY OTHER WEEK.   amLODipine (NORVASC) 5 MG tablet  Take 1 tablet (5 mg total) by mouth daily.   aspirin 81 MG chewable tablet Chew 1 tablet (81 mg total) by mouth daily.   atorvastatin (LIPITOR) 80 MG tablet Take 1 tablet (80 mg total) by mouth at bedtime.   carvedilol (COREG) 12.5 MG tablet Take 1 tablet (12.5 mg total) by mouth 2 (two) times daily with a meal.   clonazePAM (KLONOPIN) 0.5 MG tablet TAKE 1 TABLET BY MOUTH EVERY NIGHT AT BEDTIME   clopidogrel (PLAVIX) 75 MG tablet TAKE 1 TABLET (75 MG TOTAL) BY MOUTH DAILY.   Continuous Blood Gluc Sensor (FREESTYLE LIBRE 14 DAY SENSOR) MISC Apply 1 sensor every 14 (fourteen) days.   DULoxetine (CYMBALTA) 60 MG capsule Take 2 capsules (120 mg total) by mouth daily.   EPINEPHrine (EPIPEN 2-PAK) 0.3 mg/0.3 mL IJ SOAJ injection Inject 0.3 mLs (0.3 mg total) into the muscle once as needed for up to 1 dose for anaphylaxis. (Patient not taking: Reported on 03/23/2022)   fenofibrate micronized (LOFIBRA) 134 MG capsule TAKE 1 CAPSULE BY MOUTH THREE TIMES WEEKLY (Patient taking differently: Take 134 mg by mouth daily before breakfast.)   gabapentin (NEURONTIN) 600 MG tablet Take 1 tablet (600 mg total) by mouth 2 (two) times daily.   glimepiride (AMARYL) 4 MG tablet TAKE 1 TABLET BY MOUTH ONCE DAILY BEFORE BREAKFAST   isosorbide mononitrate (IMDUR) 60 MG 24 hr tablet Take 1 tablet (60 mg total)  by mouth daily.   metFORMIN (GLUCOPHAGE) 500 MG tablet Take 2 tablets (1,000 mg total) by mouth 2 (two) times daily.   nitroGLYCERIN (NITROSTAT) 0.4 MG SL tablet Place 1 tablet (0.4 mg total) under the tongue every 5 (five) minutes for 3 doses as needed for chest pain. (Patient not taking: Reported on 03/23/2022)   pantoprazole (PROTONIX) 40 MG tablet Take 1 tablet (40 mg total) by mouth daily.   Semaglutide, 2 MG/DOSE, (OZEMPIC, 2 MG/DOSE,) 8 MG/3ML SOPN Inject 2 mg into the skin weekly (Patient taking differently: Inject 2 mg into the skin once a week. Inject 2 mg into the skin weekly)   No facility-administered  encounter medications on file as of 03/23/2022.    Allergies (verified) Bee venom, Hydrocodone bit-homatrop mbr, Toradol [ketorolac tromethamine], Sulfadiazine, Morphine and related, and Sulfa drugs cross reactors   History: Past Medical History:  Diagnosis Date   Abnormal cardiac CT angiography    Acid reflux    Annual physical exam 04/08/2015   Arthritis    Atypical chest pain 06/13/2020   Blood transfusion without reported diagnosis    Body mass index (BMI) 35.0-35.9, adult 04/05/2019   Chronic fatigue 01/28/2015   Chronic headaches    on cymbalta   Chronic migraine w/o aura, not intractable, w/o stat migr 10/24/2018   Cirrhosis ([place])    Colon polyps    Complication of anesthesia    problems waking up from anesthesia   Coronary artery disease 01/23/2019   Depression    on cymbalta   Diabetes mellitus with neuropathy ([place])    Diabetes with neuropathy 04/25/2013   Diverticulitis 03/2013   Dyslipidemia 05/29/2019   Eczema    Elevated LFTs    Epidural lipomatosis 10/05/2018   Essential (primary) hypertension 04/05/2019   Essential hypertension 10/10/2019   Fatty liver    GERD (gastroesophageal reflux disease) 04/28/2011   H/O craniotomy 05/07/2015   Headache 04/28/2011   Hepatitis 10/2017   NASH cirrhosis   History of kidney stones    Hyperlipidemia    Hypersomnia with sleep apnea 01/28/2015   Hypertension    IDA (iron deficiency anemia) 01/24/2019   Idiopathic intracranial hypertension 01/14/2017   Insomnia 04/26/2013   Kidney stone    Liver cirrhosis secondary to NASH (nonalcoholic steatohepatitis) ([place]) 01/02/2016   Low back pain 04/05/2019   Lower back injury 08/14/2019   Morbid obesity ([place])    Neuromuscular disorder ([place])    neuropathy   Neuropathy    Nonalcoholic steatohepatitis 7/0/6237   Obstructive hydrocephalus ([place]) 01/28/2015   OSA -- dx ~ 2012, cpap intolerant 09/04/2014    dx ~ 2012, cpap intolerant    PCP NOTES >>> 04/08/2015   Post-op pain 03/19/2019    Post-traumatic hydrocephalus ([place])    s/p shunts x 2 (first got infected )   Presence of cerebrospinal fluid drainage device 07/28/2011   Psoriasis    sees Dr [name]   Psoriatic arthritis ([place])    REM behavioral disorder 01/14/2017   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   see's Dr.Ortmann   Severe obesity (BMI >= 40) ([place]) 01/28/2015   SI (sacroiliac) joint dysfunction 08/14/2019   Sigmoid diverticulitis 04/25/2013   Sleep apnea    no CPAP      Spondylolisthesis, lumbar region 03/16/2019   Stomach ulcer    Testosterone deficiency 04/28/2011   VP (ventriculoperitoneal) shunt status 07/31/2020   Past Surgical History:  Procedure Laterality Date   AMPUTATION Left 12/27/2021   Procedure: AMPUTATION GREAT TOE;  Surgeon: Trula Slade, DPM;  Location: [facility];  Service: Podiatry;  Laterality: Left;   BACK SURGERY  1980   BRAIN SURGERY     VP shunts placed in 2007   CHOLECYSTECTOMY N/A 08/25/2017   Procedure: LAPAROSCOPIC CHOLECYSTECTOMY WITH INTRAOPERATIVE CHOLANGIOGRAM;  Surgeon: [name], MD;  Location: [facility];  Service: General;  Laterality: N/A;   COLONOSCOPY     CORONARY STENT INTERVENTION N/A 09/27/2020   Procedure: CORONARY STENT INTERVENTION;  Surgeon: Jettie Booze, MD;  Location: [facility];  Service: Cardiovascular;  Laterality: N/A;   INTRAVASCULAR ULTRASOUND/IVUS N/A 09/27/2020   Procedure: Intravascular Ultrasound/IVUS;  Surgeon: Jettie Booze, MD;  Location: [facility];  Service: Cardiovascular;  Laterality: N/A;   JOINT REPLACEMENT     total hip   LEFT HEART CATH N/A 09/27/2020   Procedure: Left Heart Cath;  Surgeon: Jettie Booze, MD;  Location: [facility];  Service: Cardiovascular;  Laterality: N/A;   LEFT HEART CATH AND CORONARY ANGIOGRAPHY N/A 09/24/2020   Procedure: LEFT HEART CATH AND CORONARY ANGIOGRAPHY;  Surgeon: Jettie Booze, MD;  Location: [facility];  Service: Cardiovascular;  Laterality:  N/A;   LEFT HEART CATH AND CORONARY ANGIOGRAPHY N/A 08/18/2021   Procedure: LEFT HEART CATH AND CORONARY ANGIOGRAPHY;  Surgeon: Troy Sine, MD;  Location: [facility];  Service: Cardiovascular;  Laterality: N/A;   LUMBAR FUSION  03/16/2019   SHOULDER SURGERY Left 2010   TEE WITHOUT CARDIOVERSION N/A 01/01/2022   Procedure: TRANSESOPHAGEAL ECHOCARDIOGRAM (TEE);  Surgeon: Berniece Salines, DO;  Location: [facility];  Service: Cardiovascular;  Laterality: N/A;   TOE SURGERY Left 2018   TONSILLECTOMY     as a child   TOTAL HIP ARTHROPLASTY Left 2011   UPPER GASTROINTESTINAL ENDOSCOPY  01/04/2020   VENTRICULOPERITONEAL SHUNT  2007   x2   Family History  Problem Relation Age of Onset   Other Mother    Lung cancer Father        alive, former smoker    Heart disease Brother        MI age 23   Other Brother        Murdered   Down syndrome Son    Diabetes Neg Hx    Prostate cancer Neg Hx    Colon cancer Neg Hx    Stomach cancer Neg Hx    Pancreatic cancer Neg Hx    Liver disease Neg Hx    Social History   Socioeconomic History   Marital status: Married    Spouse name: [name]   Number of children: 2   Years of education: Not on file   Highest education level: Not on file  Occupational History   Occupation: disabled   Tobacco Use   Smoking status: Never   Smokeless tobacco: Never  Vaping Use   Vaping Use: Never used  Substance and Sexual Activity   Alcohol use: No   Drug use: No   Sexual activity: Yes    Partners: Female  Other Topics Concern   Not on file  Social History Narrative   Household-- pt , wife, one adult son with Down's syndrome   younger son lives in [place] 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   Social Determinants of Health   Financial Resource Strain: Low Risk  (03/18/2021)   Overall Financial Resource Strain (CARDIA)    Difficulty of Paying Living Expenses:  Not hard at all  Food Insecurity: No Food  Insecurity (01/22/2022)   Hunger Vital Sign    Worried About Running Out of Food in the Last Year: Never true    Ran Out of Food in the Last Year: Never true  Transportation Needs: No Transportation Needs (01/22/2022)   PRAPARE - Hydrologist (Medical): No    Lack of Transportation (Non-Medical): No  Physical Activity: Inactive (03/18/2021)   Exercise Vital Sign    Days of Exercise per Week: 0 days    Minutes of Exercise per Session: 0 min  Stress: No Stress Concern Present (03/18/2021)   [place]    Feeling of Stress : Not at all  Social Connections: Moderately Integrated (03/18/2021)   Social Connection and Isolation Panel [NHANES]    Frequency of Communication with Friends and Family: More than three times a week    Frequency of Social Gatherings with Friends and Family: Once a week    Attends Religious Services: Never    Marine scientist or Organizations: Yes    Attends Music therapist: 1 to 4 times per year    Marital Status: Married    Tobacco Counseling Counseling given: Not Answered   Clinical Intake:  Pre-visit preparation completed: Yes  Pain : No/denies pain     Nutritional Risks: None Diabetes: Yes CBG done?: No Did pt. bring in CBG monitor from home?: No (audio visit)  How often do you need to have someone help you when you read instructions, pamphlets, or other written materials from your doctor or pharmacy?: 1 - Never  Diabetic? Yes Nutrition Risk Assessment:  Has the patient had any N/V/D within the last 2 months?  No  Does the patient have any non-healing wounds?  No  Has the patient had any unintentional weight loss or weight gain?  No   Diabetes:  Is the patient diabetic?  Yes  If diabetic, was a CBG obtained today?  No  Did the patient bring in their glucometer from home?   Audio visit How often do you monitor your CBG's? 3-4 times  a day.   Financial Strains and Diabetes Management:  Are you having any financial strains with the device, your supplies or your medication? No .  Does the patient want to be seen by Chronic Care Management for management of their diabetes?  No  Would the patient like to be referred to a Nutritionist or for Diabetic Management?  No   Diabetic Exams:  Diabetic Eye Exam: Overdue for diabetic eye exam. Pt has been advised about the importance in completing this exam. Patient advised to call and schedule an eye exam. Diabetic Foot Exam: Completed 06/26/21    Interpreter Needed?: No  Information entered by :: Beatris Ship, CMA   Activities of Daily Living    03/23/2022    2:24 PM 01/22/2022   10:00 AM  In your present state of health, do you have any difficulty performing the following activities:  Hearing? 0 0  Vision? 0 0  Difficulty concentrating or making decisions? 1 0  Comment some memory loss   Walking or climbing stairs? 1 0  Comment sometimes   Dressing or bathing? 0 0  Doing errands, shopping? 0 1  Comment  reports has transportation and denies any needs  Preparing Food and eating ? N N  Using the Toilet? N N  In the past six months, have  you accidently leaked urine? N   Do you have problems with loss of bowel control? N   Managing your Medications? N N  Managing your Finances? N   Housekeeping or managing your Housekeeping? N     Patient Care Team: Colon Branch, MD as PCP - General (Internal Medicine) Park Liter, MD as PCP - Cardiology (Cardiology) Iran Planas, MD as Consulting Physician (Orthopedic Surgery) Rexene Agent, MD as Attending Physician (Nephrology) Erline Levine, MD as Consulting Physician (Neurosurgery) Lbcardiology, Rounding, MD as Rounding Team (Cardiology)  Indicate any recent Medical Services you may have received from other than [facility] providers in the past year (date may be approximate).     Assessment:   This is a routine  wellness examination for [place].  Hearing/Vision screen No results found.  Dietary issues and exercise activities discussed: Current Exercise Habits: The patient does not participate in regular exercise at present, Exercise limited by: None identified   Goals Addressed   None    Depression Screen    03/23/2022    2:39 PM 03/23/2022   10:33 AM 06/26/2021   11:26 AM 06/26/2021   10:42 AM 06/17/2021    9:36 AM 03/18/2021   11:06 AM 12/25/2020   11:40 AM  PHQ 2/9 Scores  PHQ - 2 Score 2 2 2 2  0 0 2  PHQ- 9 Score 9 9 9  6  8     Fall Risk    03/23/2022    2:23 PM 03/23/2022   10:41 AM 01/22/2022   10:02 AM 06/26/2021   10:42 AM 03/18/2021   11:04 AM  Fall Risk   Falls in the past year? 1 0 1 1 1   Number falls in past yr: 1 0 1 1 1   Comment   going down stairs at night and missed bottom steps    Injury with Fall? 0 0 0 0 0  Risk for fall due to : History of fall(s)  History of fall(s)  History of fall(s)  Follow up Falls evaluation completed Falls evaluation completed   Falls prevention discussed    FALL RISK PREVENTION PERTAINING TO THE HOME:  Any stairs in or around the home? Yes  If so, are there any without handrails? No  Home free of loose throw rugs in walkways, pet beds, electrical cords, etc? Yes  Adequate lighting in your home to reduce risk of falls? Yes   ASSISTIVE DEVICES UTILIZED TO PREVENT FALLS:  Life alert? No  Use of a cane, walker or w/c? No  Grab bars in the bathroom? Yes  Shower chair or bench in shower? Yes  Elevated toilet seat or a handicapped toilet? Yes   TIMED UP AND GO:  Was the test performed? No . Audio visit   Cognitive Function:        03/23/2022    2:29 PM  6CIT Screen  What Year? 0 points  What month? 0 points  What time? 0 points  Count back from 20 0 points  Months in reverse 2 points  Repeat phrase 2 points  Total Score 4 points    Immunizations Immunization History  Administered Date(s) Administered   Hep A / Hep  B 10/28/2015, 11/07/2015, 11/20/2015   Influenza,inj,Quad PF,6+ Mos 04/24/2019, 06/11/2020, 05/20/2021, 03/23/2022   PFIZER Comirnaty(Gray Top)Covid-19 Tri-Sucrose Vaccine 11/29/2020   PFIZER(Purple Top)SARS-COV-2 Vaccination 09/03/2019, 09/26/2019, 02/24/2020   PNEUMOCOCCAL CONJUGATE-20 12/25/2020   Pfizer Covid-19 Vaccine Bivalent Booster 13yr & up 05/20/2021   Pneumococcal Polysaccharide-23 11/16/2017  Td 05/22/2013   Tdap 09/30/2021    TDAP status: Up to date  Flu Vaccine status: Up to date  Pneumococcal vaccine status: Up to date  Covid-19 vaccine status: Information provided on how to obtain vaccines.   Qualifies for Shingles Vaccine? Yes   Zostavax completed No   Shingrix Completed?: No.    Education has been provided regarding the importance of this vaccine. Patient has been advised to call insurance company to determine out of pocket expense if they have not yet received this vaccine. Advised may also receive vaccine at local pharmacy or Health Dept. Verbalized acceptance and understanding.  Screening Tests Health Maintenance  Topic Date Due   Zoster Vaccines- Shingrix (1 of 2) Never done   COVID-19 Vaccine (6 - Pfizer risk series) 07/15/2021   OPHTHALMOLOGY EXAM  10/24/2021   FOOT EXAM  06/26/2022   HEMOGLOBIN A1C  08/02/2022   COLONOSCOPY (Pts 45-59yr Insurance coverage will need to be confirmed)  01/04/2023   Diabetic kidney evaluation - GFR measurement  01/31/2023   Diabetic kidney evaluation - Urine ACR  01/31/2023   TETANUS/TDAP  10/01/2031   INFLUENZA VACCINE  Completed   Hepatitis C Screening  Completed   HIV Screening  Completed   HPV VACCINES  Aged Out    Health Maintenance  Health Maintenance Due  Topic Date Due   Zoster Vaccines- Shingrix (1 of 2) Never done   COVID-19 Vaccine (6 - Pfizer risk series) 07/15/2021   OPHTHALMOLOGY EXAM  10/24/2021    Colorectal cancer screening: Type of screening: Colonoscopy. Completed 01/04/20. Repeat every 3  years  Lung Cancer Screening: (Low Dose CT Chest recommended if Age 64-80years, 30 pack-year currently smoking OR have quit w/in 15years.) does not qualify.   Lung Cancer Screening Referral: N/a  Additional Screening:  Hepatitis C Screening: does qualify; Completed 04/25/13  Vision Screening: Recommended annual ophthalmology exams for early detection of glaucoma and other disorders of the eye. Is the patient up to date with their annual eye exam?  No  Who is the provider or what is the name of the office in which the patient attends annual eye exams? [facility] If pt is not established with a provider, would they like to be referred to a provider to establish care? No .   Dental Screening: Recommended annual dental exams for proper oral hygiene  Community Resource Referral / Chronic Care Management: CRR required this visit?  No   CCM required this visit?  No      Plan:     I have personally reviewed and noted the following in the patient's chart:   Medical and social history Use of alcohol, tobacco or illicit drugs  Current medications and supplements including opioid prescriptions. Patient is not currently taking opioid prescriptions. Functional ability and status Nutritional status Physical activity Advanced directives List of other physicians Hospitalizations, surgeries, and ER visits in previous 12 months Vitals Screenings to include cognitive, depression, and falls Referrals and appointments  In addition, I have reviewed and discussed with patient certain preventive protocols, quality metrics, and best practice recommendations. A written personalized care plan for preventive services as well as general preventive health recommendations were provided to patient.   Due to this being a telephonic visit, the after visit summary with patients personalized plan was offered to patient via mail or my-chart.  Patient would like to access on my-chart.  BBeatris Ship CDeport  03/23/2022   Nurse Notes: None  I have reviewed  and agree with Health Coaches documentation.  Kathlene November, MD

## 2022-03-23 NOTE — Patient Instructions (Signed)
[name] , Thank you for taking time to come for your Medicare Wellness Visit. I appreciate your ongoing commitment to your health goals. Please review the following plan we discussed and let me know if I can assist you in the future.   These are the goals we discussed:  Goals      Patient Stated     Lose some weight        This is a list of the screening recommended for you and due dates:  Health Maintenance  Topic Date Due   Zoster (Shingles) Vaccine (1 of 2) Never done   COVID-19 Vaccine (6 - Pfizer risk series) 07/15/2021   Eye exam for diabetics  10/24/2021   Complete foot exam   06/26/2022   Hemoglobin A1C  08/02/2022   Colon Cancer Screening  01/04/2023   Yearly kidney function blood test for diabetes  01/31/2023   Yearly kidney health urinalysis for diabetes  01/31/2023   Tetanus Vaccine  10/01/2031   Flu Shot  Completed   Hepatitis C Screening: USPSTF Recommendation to screen - Ages 18-79 yo.  Completed   HIV Screening  Completed   HPV Vaccine  Aged Out      Next appointment: Follow up in one year for your annual wellness visit   Preventive Care 40-64 Years, Male Preventive care refers to lifestyle choices and visits with your health care provider that can promote health and wellness. What does preventive care include? A yearly physical exam. This is also called an annual well check. Dental exams once or twice a year. Routine eye exams. Ask your health care provider how often you should have your eyes checked. Personal lifestyle choices, including: Daily care of your teeth and gums. Regular physical activity. Eating a healthy diet. Avoiding tobacco and drug use. Limiting alcohol use. Practicing safe sex. Taking low-dose aspirin every day starting at age 65. What happens during an annual well check? The services and screenings done by your health care provider during your annual well check will depend on your age, overall health, lifestyle risk factors, and  family history of disease. Counseling  Your health care provider may ask you questions about your: Alcohol use. Tobacco use. Drug use. Emotional well-being. Home and relationship well-being. Sexual activity. Eating habits. Work and work Statistician. Screening  You may have the following tests or measurements: Height, weight, and BMI. Blood pressure. Lipid and cholesterol levels. These may be checked every 5 years, or more frequently if you are over 63 years old. Skin check. Lung cancer screening. You may have this screening every year starting at age 63 if you have a 30-pack-year history of smoking and currently smoke or have quit within the past 15 years. Fecal occult blood test (FOBT) of the stool. You may have this test every year starting at age 74. Flexible sigmoidoscopy or colonoscopy. You may have a sigmoidoscopy every 5 years or a colonoscopy every 10 years starting at age 87. Prostate cancer screening. Recommendations will vary depending on your family history and other risks. Hepatitis C blood test. Hepatitis B blood test. Sexually transmitted disease (STD) testing. Diabetes screening. This is done by checking your blood sugar (glucose) after you have not eaten for a while (fasting). You may have this done every 1-3 years. Discuss your test results, treatment options, and if necessary, the need for more tests with your health care provider. Vaccines  Your health care provider may recommend certain vaccines, such as: Influenza vaccine. This is recommended every  year. Tetanus, diphtheria, and acellular pertussis (Tdap, Td) vaccine. You may need a Td booster every 10 years. Zoster vaccine. You may need this after age 72. Pneumococcal 13-valent conjugate (PCV13) vaccine. You may need this if you have certain conditions and have not been vaccinated. Pneumococcal polysaccharide (PPSV23) vaccine. You may need one or two doses if you smoke cigarettes or if you have certain  conditions. Talk to your health care provider about which screenings and vaccines you need and how often you need them. This information is not intended to replace advice given to you by your health care provider. Make sure you discuss any questions you have with your health care provider. Document Released: 07/12/2015 Document Revised: 03/04/2016 Document Reviewed: 04/16/2015 Elsevier Interactive Patient Education  2017 [employer] Prevention in the Home Falls can cause injuries. They can happen to people of all ages. There are many things you can do to make your home safe and to help prevent falls. What can I do on the outside of my home? Regularly fix the edges of walkways and driveways and fix any cracks. Remove anything that might make you trip as you walk through a door, such as a raised step or threshold. Trim any bushes or trees on the path to your home. Use bright outdoor lighting. Clear any walking paths of anything that might make someone trip, such as rocks or tools. Regularly check to see if handrails are loose or broken. Make sure that both sides of any steps have handrails. Any raised decks and porches should have guardrails on the edges. Have any leaves, snow, or ice cleared regularly. Use sand or salt on walking paths during winter. Clean up any spills in your garage right away. This includes oil or grease spills. What can I do in the bathroom? Use night lights. Install grab bars by the toilet and in the tub and shower. Do not use towel bars as grab bars. Use non-skid mats or decals in the tub or shower. If you need to sit down in the shower, use a plastic, non-slip stool. Keep the floor dry. Clean up any water that spills on the floor as soon as it happens. Remove soap buildup in the tub or shower regularly. Attach bath mats securely with double-sided non-slip rug tape. Do not have throw rugs and other things on the floor that can make you trip. What can I do in  the bedroom? Use night lights. Make sure that you have a light by your bed that is easy to reach. Do not use any sheets or blankets that are too big for your bed. They should not hang down onto the floor. Have a firm chair that has side arms. You can use this for support while you get dressed. Do not have throw rugs and other things on the floor that can make you trip. What can I do in the kitchen? Clean up any spills right away. Avoid walking on wet floors. Keep items that you use a lot in easy-to-reach places. If you need to reach something above you, use a strong step stool that has a grab bar. Keep electrical cords out of the way. Do not use floor polish or wax that makes floors slippery. If you must use wax, use non-skid floor wax. Do not have throw rugs and other things on the floor that can make you trip. What can I do with my stairs? Do not leave any items on the stairs. Make sure that there  are handrails on both sides of the stairs and use them. Fix handrails that are broken or loose. Make sure that handrails are as long as the stairways. Check any carpeting to make sure that it is firmly attached to the stairs. Fix any carpet that is loose or worn. Avoid having throw rugs at the top or bottom of the stairs. If you do have throw rugs, attach them to the floor with carpet tape. Make sure that you have a light switch at the top of the stairs and the bottom of the stairs. If you do not have them, ask someone to add them for you. What else can I do to help prevent falls? Wear shoes that: Do not have high heels. Have rubber bottoms. Are comfortable and fit you well. Are closed at the toe. Do not wear sandals. If you use a stepladder: Make sure that it is fully opened. Do not climb a closed stepladder. Make sure that both sides of the stepladder are locked into place. Ask someone to hold it for you, if possible. Clearly mark and make sure that you can see: Any grab bars or  handrails. First and last steps. Where the edge of each step is. Use tools that help you move around (mobility aids) if they are needed. These include: Canes. Walkers. Scooters. Crutches. Turn on the lights when you go into a dark area. Replace any light bulbs as soon as they burn out. Set up your furniture so you have a clear path. Avoid moving your furniture around. If any of your floors are uneven, fix them. If there are any pets around you, be aware of where they are. Review your medicines with your doctor. Some medicines can make you feel dizzy. This can increase your chance of falling. Ask your doctor what other things that you can do to help prevent falls. This information is not intended to replace advice given to you by your health care provider. Make sure you discuss any questions you have with your health care provider. Document Released: 04/11/2009 Document Revised: 11/21/2015 Document Reviewed: 07/20/2014 Elsevier Interactive Patient Education  2017 [employer].

## 2022-03-23 NOTE — Progress Notes (Unsigned)
Subjective:    Patient ID: James [name], male    DOB: 1956/02/17, 66 y.o.   MRN: [id number]  DOS:  03/23/2022 Type of visit - description: Routine checkup  Here for routine checkup, needs multiple refills. In general feeling well although he admits that last night did not sleep well and this morning he feels somewhat dizzy and has a mild headache. On further questioning, this is a common occurrence every time he does not have a good night sleep.  He denies chest pain or difficulty breathing No edema No nausea or vomiting No diplopia, slurred speech or motor deficits.  Since the last visit, had multiple office visit with other doctors, chart is reviewed. Medications reviewed, not taking aspirin "I forget".   Review of Systems See above   Past Medical History:  Diagnosis Date   Abnormal cardiac CT angiography    Acid reflux    Annual physical exam 04/08/2015   Arthritis    Atypical chest pain 06/13/2020   Blood transfusion without reported diagnosis    Body mass index (BMI) 35.0-35.9, adult 04/05/2019   Chronic fatigue 01/28/2015   Chronic headaches    on cymbalta   Chronic migraine w/o aura, not intractable, w/o stat migr 10/24/2018   Cirrhosis ([place])    Colon polyps    Complication of anesthesia    problems waking up from anesthesia   Coronary artery disease 01/23/2019   Depression    on cymbalta   Diabetes mellitus with neuropathy ([place])    Diabetes with neuropathy 04/25/2013   Diverticulitis 03/2013   Dyslipidemia 05/29/2019   Eczema    Elevated LFTs    Epidural lipomatosis 10/05/2018   Essential (primary) hypertension 04/05/2019   Essential hypertension 10/10/2019   Fatty liver    GERD (gastroesophageal reflux disease) 04/28/2011   H/O craniotomy 05/07/2015   Headache 04/28/2011   Hepatitis 10/2017   NASH cirrhosis   History of kidney stones    Hyperlipidemia    Hypersomnia with sleep apnea 01/28/2015   Hypertension    IDA (iron deficiency anemia) 01/24/2019    Idiopathic intracranial hypertension 01/14/2017   Insomnia 04/26/2013   Kidney stone    Liver cirrhosis secondary to NASH (nonalcoholic steatohepatitis) ([place]) 01/02/2016   Low back pain 04/05/2019   Lower back injury 08/14/2019   Morbid obesity ([place])    Neuromuscular disorder ([place])    neuropathy   Neuropathy    Nonalcoholic steatohepatitis 08/04/35   Obstructive hydrocephalus ([place]) 01/28/2015   OSA -- dx ~ 2012, cpap intolerant 09/04/2014    dx ~ 2012, cpap intolerant    PCP NOTES >>> 04/08/2015   Post-op pain 03/19/2019   Post-traumatic hydrocephalus ([place])    s/p shunts x 2 (first got infected )   Presence of cerebrospinal fluid drainage device 07/28/2011   Psoriasis    sees Dr [name]   Psoriatic arthritis ([place])    REM behavioral disorder 01/14/2017   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   see's Dr.Ortmann   Severe obesity (BMI >= 40) ([place]) 01/28/2015   SI (sacroiliac) joint dysfunction 08/14/2019   Sigmoid diverticulitis 04/25/2013   Sleep apnea    no CPAP      Spondylolisthesis, lumbar region 03/16/2019   Stomach ulcer    Testosterone deficiency 04/28/2011   VP (ventriculoperitoneal) shunt status 07/31/2020    Past Surgical History:  Procedure Laterality Date   AMPUTATION Left 12/27/2021   Procedure: AMPUTATION GREAT TOE;  Surgeon: Trula Slade, DPM;  Location: [facility]  OR;  Service: Podiatry;  Laterality: Left;   BACK SURGERY  1980   BRAIN SURGERY     VP shunts placed in 2007   CHOLECYSTECTOMY N/A 08/25/2017   Procedure: LAPAROSCOPIC CHOLECYSTECTOMY WITH INTRAOPERATIVE CHOLANGIOGRAM;  Surgeon: [name], MD;  Location: [facility];  Service: General;  Laterality: N/A;   COLONOSCOPY     CORONARY STENT INTERVENTION N/A 09/27/2020   Procedure: CORONARY STENT INTERVENTION;  Surgeon: Jettie Booze, MD;  Location: [facility];  Service: Cardiovascular;  Laterality: N/A;   INTRAVASCULAR ULTRASOUND/IVUS N/A 09/27/2020   Procedure: Intravascular Ultrasound/IVUS;   Surgeon: Jettie Booze, MD;  Location: [facility] [place] [facility];  Service: Cardiovascular;  Laterality: N/A;   JOINT REPLACEMENT     total hip   LEFT HEART CATH N/A 09/27/2020   Procedure: Left Heart Cath;  Surgeon: Jettie Booze, MD;  Location: [facility];  Service: Cardiovascular;  Laterality: N/A;   LEFT HEART CATH AND CORONARY ANGIOGRAPHY N/A 09/24/2020   Procedure: LEFT HEART CATH AND CORONARY ANGIOGRAPHY;  Surgeon: Jettie Booze, MD;  Location: [facility];  Service: Cardiovascular;  Laterality: N/A;   LEFT HEART CATH AND CORONARY ANGIOGRAPHY N/A 08/18/2021   Procedure: LEFT HEART CATH AND CORONARY ANGIOGRAPHY;  Surgeon: Troy Sine, MD;  Location: [facility];  Service: Cardiovascular;  Laterality: N/A;   LUMBAR FUSION  03/16/2019   SHOULDER SURGERY Left 2010   TEE WITHOUT CARDIOVERSION N/A 01/01/2022   Procedure: TRANSESOPHAGEAL ECHOCARDIOGRAM (TEE);  Surgeon: Berniece Salines, DO;  Location: [facility];  Service: Cardiovascular;  Laterality: N/A;   TOE SURGERY Left 2018   TONSILLECTOMY     as a child   TOTAL HIP ARTHROPLASTY Left 2011   UPPER GASTROINTESTINAL ENDOSCOPY  01/04/2020   VENTRICULOPERITONEAL SHUNT  2007   x2    Current Outpatient Medications  Medication Instructions   acetaminophen (TYLENOL) 650 mg, Oral, Every 4 hours PRN   Adalimumab (HUMIRA PEN) 40 MG/0.8ML PNKT INJECT 40 MG (0.8 ML) UNDER THE SKIN EVERY OTHER WEEK.   amLODipine (NORVASC) 5 mg, Oral, Daily   aspirin 81 mg, Oral, Daily   atorvastatin (LIPITOR) 80 mg, Oral, Daily at bedtime   carvedilol (COREG) 12.5 mg, Oral, 2 times daily with meals   clonazePAM (KLONOPIN) 0.5 MG tablet TAKE 1 TABLET BY MOUTH EVERY NIGHT AT BEDTIME   clopidogrel (PLAVIX) 75 MG tablet TAKE 1 TABLET (75 MG TOTAL) BY MOUTH DAILY.   Continuous Blood Gluc Sensor (FREESTYLE LIBRE 14 DAY SENSOR) MISC Apply 1 sensor every 14 (fourteen) days.   DULoxetine (CYMBALTA) 60 MG capsule TAKE 2 CAPSULES (120 MG  TOTAL) BY MOUTH DAILY.   EPINEPHrine (EPIPEN 2-PAK) 0.3 mg, Intramuscular, Once PRN   fenofibrate micronized (LOFIBRA) 134 MG capsule TAKE 1 CAPSULE BY MOUTH THREE TIMES WEEKLY   gabapentin (NEURONTIN) 600 MG tablet TAKE 1 TABLET (600 MG TOTAL) BY MOUTH 2 (TWO) TIMES DAILY.   glimepiride (AMARYL) 4 MG tablet TAKE 1 TABLET BY MOUTH ONCE DAILY BEFORE BREAKFAST   isosorbide mononitrate (IMDUR) 60 mg, Oral, Daily   metFORMIN (GLUCOPHAGE) 1,000 mg, Oral, 2 times daily   nitroGLYCERIN (NITROSTAT) 0.4 MG SL tablet Place 1 tablet (0.4 mg total) under the tongue every 5 (five) minutes for 3 doses as needed for chest pain.   pantoprazole (PROTONIX) 40 mg, Oral, Daily   Semaglutide, 2 MG/DOSE, (OZEMPIC, 2 MG/DOSE,) 8 MG/3ML SOPN Inject 2 mg into the skin weekly  Objective:   Physical Exam BP 116/64   Pulse 70   Temp 98.3 F (36.8 C) (Oral)   Resp 18   Ht 5' 11"  (1.803 m)   Wt 242 lb 2 oz (109.8 kg)   SpO2 93%   BMI 33.77 kg/m  General:   Well developed, NAD, BMI noted.  HEENT:  Scalp with surgical scar from previous neurosurgeries. Face symmetric, atraumatic Lungs:  CTA B Normal respiratory effort, no intercostal retractions, no accessory muscle use. Heart: RRR,  no murmur.  Abdomen:  Not distended, soft, non-tender. No rebound or rigidity.   Skin: Not pale. Not jaundice Lower extremities: no pretibial edema bilaterally  Neurologic:  alert & oriented X3.  Speech normal, gait appropriate for age and unassisted. Motor symmetric. Unable to perform tandem walking (not a new issue) Psych--  Cognition and judgment appear intact.  Cooperative with normal attention span and concentration.  Behavior appropriate. No anxious or depressed appearing.     Assessment      Assessment  DM - Dr [name] Neuropathy (x years, rx gaba 05-2014, w/u 11-2014  RPR neg, vit D-B12-Folic Acid wnl ); Saw Dr Posey Pronto, NCS [phone] (see results) HTN: History of AKI with ARBs  CRI, sees nephrology,  etiology felt to be hHyperglycemia, intermittent NSAIDs, contrast exposures Hyperlipidemia (TG in the 500s 2016) OSA , dx 2012, sleep study again 02-2015 Dr Dohmeier--> severe OSA, intolerant to CPAP Depression, insomnia: on Cymbalta NEURO: --Chronic headaches :on Cymbalta  --Posttraumatic hydrocephalus s/p 2 shunts (first got infected) MSK: on disability d/t back pain- HAs GI:  --GERD, diverticulitis 2014, h/o PUD -- NASH with cirrhosis per GI note 10/2017, s/p Hep A/B shots --Anemia: - felt to be d/t   GAVE (gastric antral vascular ectasia) and a inflammatory polyp, s/p  EGD 10/2018    -Work-up repeated 10/2019: EGD: GAVE  versus portal hypertensive gastropathy. Colonoscopy polyps.  Tubular adenoma. Gastric BX negative H. pylori, reactive changes. Psoriasis, psoriatic arthritis: used  HUMIRA  CV: +FH CAD brother MI age 84 Abnormal EKG, saw cardiology 01-2019, echo essentially negative, perfusion stress test 03/01/2019 (-) CAD: CP, PTCA of the LAD 09-2020 H/o urolithiasis Hypogonadism  Dx 2012, normal T 11-2014 (on no RX)  PLAN DM, saw Endo 02/03/2022 Neuropathy: RF gabapentin HTN: BP today is very good, continue amlodipine, Imdur, carvedilol (refill sent). Dizziness: Feeling dizzy and having a headache this morning, see HPI, apparently related to not having a good night sleep.  Neurological exam is okay.  Orthostatic vital signs essentially negative.  Recommend rest, drink plenty of fluids and  seek medical attention if severe symptoms. Depression, insomnia: RF Cymbalta, continue clonazepam Osteomyelitis, L toe. History of MSSA bacteremia due to osteomyelitis, status L great toe amputation June 2023 , PICC line removed 01/09/2022 Chronic headaches Saw neurology February 02, 2022, was injected Botox for migraines OSA: CPAP intolerant CAD Last visit with cardiology 12/22/2021, felt to be on appropriate medication ( including aspirin, Plavix), they noted the last cardiac catheterization was  performed in February 2023, essentially unchanged from previous, for medical management. Recommend no change , good compliance with aspirin which he forgets often times. Hydrocephalus: Saw neurosurgery 12/19/2021, at that time he had some headaches and was evaluated.  Shunt was reprogrammed. Preventive care: Flu shot today Labs reviewed, no due for any blood work. RTC 4 months  Time spent 30 minutes due to extensive chart review, multiple refills,and evaluation of dizziness

## 2022-03-24 NOTE — Assessment & Plan Note (Signed)
DM, saw Endo 02/03/2022 Neuropathy: RF gabapentin HTN: BP today is very good, continue amlodipine, Imdur, carvedilol (refill sent). Dizziness: Feeling dizzy and having a headache this morning, see HPI, apparently related to not having a good night sleep.  Neurological exam is okay.  Orthostatic vital signs essentially negative.  Recommend rest, drink plenty of fluids and  seek medical attention if severe symptoms. Depression, insomnia: RF Cymbalta, continue clonazepam Osteomyelitis, L toe. History of MSSA bacteremia due to osteomyelitis, status L great toe amputation June 2023 , PICC line removed 01/09/2022 Chronic headaches Saw neurology February 02, 2022, was injected Botox for migraines OSA: CPAP intolerant CAD Last visit with cardiology 12/22/2021, felt to be on appropriate medication ( including aspirin, Plavix), they noted the last cardiac catheterization was performed in February 2023, essentially unchanged from previous, for medical management. Recommend no change , good compliance with aspirin which he forgets often times. Hydrocephalus: Saw neurosurgery 12/19/2021, at that time he had some headaches and was evaluated.  Shunt was reprogrammed. Preventive care: Flu shot today Labs reviewed, no due for any blood work. RTC 4 months

## 2022-04-01 ENCOUNTER — Other Ambulatory Visit (HOSPITAL_COMMUNITY): Payer: Self-pay

## 2022-04-07 ENCOUNTER — Other Ambulatory Visit (HOSPITAL_BASED_OUTPATIENT_CLINIC_OR_DEPARTMENT_OTHER): Payer: Self-pay

## 2022-04-09 ENCOUNTER — Telehealth: Payer: Self-pay | Admitting: Pharmacist

## 2022-04-09 ENCOUNTER — Other Ambulatory Visit (HOSPITAL_COMMUNITY): Payer: Self-pay

## 2022-04-09 NOTE — Telephone Encounter (Signed)
Called patient to schedule an appointment for the  [facility]. I was unable to reach the patient so I left a HIPAA-compliant message requesting that the patient return my call.   Luke Van Ausdall, PharmD, BCACP, CPP Clinical Pharmacist [facility] [phone]  

## 2022-04-15 ENCOUNTER — Other Ambulatory Visit (HOSPITAL_BASED_OUTPATIENT_CLINIC_OR_DEPARTMENT_OTHER): Payer: Self-pay

## 2022-04-24 ENCOUNTER — Other Ambulatory Visit: Payer: Self-pay

## 2022-04-27 ENCOUNTER — Telehealth: Payer: Self-pay | Admitting: Pharmacist

## 2022-04-27 ENCOUNTER — Other Ambulatory Visit: Payer: Self-pay | Admitting: Cardiology

## 2022-04-27 ENCOUNTER — Other Ambulatory Visit (HOSPITAL_COMMUNITY): Payer: Self-pay

## 2022-04-27 ENCOUNTER — Other Ambulatory Visit: Payer: Self-pay | Admitting: Internal Medicine

## 2022-04-27 DIAGNOSIS — K7460 Unspecified cirrhosis of liver: Secondary | ICD-10-CM

## 2022-04-27 NOTE — Telephone Encounter (Signed)
Called patient to schedule an appointment for the [place] [employer] [facility] [employer] [facility]. I was unable to reach the patient so I left a HIPAA-compliant message requesting that the patient return my call.   Luke Van Ausdall, PharmD, BCACP, CPP Clinical Pharmacist [facility] [phone]  

## 2022-04-28 ENCOUNTER — Other Ambulatory Visit (HOSPITAL_BASED_OUTPATIENT_CLINIC_OR_DEPARTMENT_OTHER): Payer: Self-pay

## 2022-04-28 MED ORDER — CARVEDILOL 12.5 MG PO TABS
12.5000 mg | ORAL_TABLET | Freq: Two times a day (BID) | ORAL | 0 refills | Status: DC
  Filled 2022-04-28: qty 180, 90d supply, fill #0

## 2022-04-28 MED ORDER — COMIRNATY 30 MCG/0.3ML IM SUSY
PREFILLED_SYRINGE | INTRAMUSCULAR | 0 refills | Status: DC
  Filled 2022-04-28: qty 0.3, 1d supply, fill #0

## 2022-04-29 ENCOUNTER — Other Ambulatory Visit (HOSPITAL_BASED_OUTPATIENT_CLINIC_OR_DEPARTMENT_OTHER): Payer: Self-pay

## 2022-04-29 MED ORDER — ISOSORBIDE MONONITRATE ER 60 MG PO TB24
60.0000 mg | ORAL_TABLET | Freq: Every day | ORAL | 1 refills | Status: DC
  Filled 2022-04-29: qty 30, 30d supply, fill #0
  Filled 2022-05-28: qty 30, 30d supply, fill #1

## 2022-05-04 ENCOUNTER — Ambulatory Visit: Payer: 59 | Admitting: Neurology

## 2022-05-04 ENCOUNTER — Telehealth: Payer: Self-pay | Admitting: Neurology

## 2022-05-04 ENCOUNTER — Other Ambulatory Visit (HOSPITAL_BASED_OUTPATIENT_CLINIC_OR_DEPARTMENT_OTHER): Payer: Self-pay

## 2022-05-04 ENCOUNTER — Other Ambulatory Visit (HOSPITAL_COMMUNITY): Payer: Self-pay

## 2022-05-04 ENCOUNTER — Encounter: Payer: Self-pay | Admitting: *Deleted

## 2022-05-04 ENCOUNTER — Ambulatory Visit (INDEPENDENT_AMBULATORY_CARE_PROVIDER_SITE_OTHER): Payer: 59 | Admitting: Neurology

## 2022-05-04 VITALS — BP 129/87 | HR 79 | Ht 71.0 in

## 2022-05-04 DIAGNOSIS — G4752 REM sleep behavior disorder: Secondary | ICD-10-CM

## 2022-05-04 DIAGNOSIS — G43709 Chronic migraine without aura, not intractable, without status migrainosus: Secondary | ICD-10-CM | POA: Diagnosis not present

## 2022-05-04 DIAGNOSIS — M542 Cervicalgia: Secondary | ICD-10-CM

## 2022-05-04 DIAGNOSIS — G4733 Obstructive sleep apnea (adult) (pediatric): Secondary | ICD-10-CM | POA: Diagnosis not present

## 2022-05-04 DIAGNOSIS — I200 Unstable angina: Secondary | ICD-10-CM

## 2022-05-04 DIAGNOSIS — W19XXXA Unspecified fall, initial encounter: Secondary | ICD-10-CM | POA: Diagnosis not present

## 2022-05-04 NOTE — Progress Notes (Signed)
[facility]  PATIENT: James [name] DOB: 03/31/1956  REFERRING DOCTOR OR PCP:  Kathlene November  SOURCE: patient, notes from Dr. [name], imaging results and MRI and CT scans on PACS personally reviewed  _________________________________   HISTORICAL  CHIEF COMPLAINT:  Chief Complaint  Patient presents with   Injections    RM 2, alone.    HISTORY OF PRESENT ILLNESS:  James [name] is a 66 y.o. man with idiopathic intracranial hypertension and recent increase in the frequency and severity of headache  Update 05/05/2022:  He fell 2 weeks ago and hit his arm and head.  No LOC.    His HA worsened after that.  Current HA is holocephalic from occiput to frontal.  Left = riight.   He generally reports a benefit from the Botox therapy with a significant reduction in headache frequency and intensity.  When he gets a headache he is usually able to treat it with Excedrin with benefit.  Before Botox he was having daily headaches that were more severe.  Neck pain is also doing better.  When pain is present, it is frontal more than occipital and off to the right is affected more than the left.  At times he gets nausea.  When the headaches more severe he has photophobia and phonophobia. Sometimes, headaches improve when he is laying down but other times they do not change between sitting and lying down. Moving usually worsens the headaches.  He did not get a benefit from multiple prophylactic medications including Keppra, gabapentin, Emgality, Cymbalta.    He has obstructive sleep apnea but was unable to tolerate CPAP.  We discussed the inspire device and he is potentially interested.  Likelihood of success would be higher if he lost with a bit more weight.  His REM behavior disorder has done well on nighttime clonazepam.  In early 2022, he had 3 cardiac stents after being diagnosed with CAD after presenting with chest pain.   He had a small MI.    He has a recent toe amputation.  He  has cirrhosis.  (Nonalcoholic)  Chronic Migraine History: He was having daily HA with migrainous features.   He did not get a benefit from multiple prophylactic medications including Keppra, gabapentin, Emgality, Cymbalta.   Botox therapy started July 2021.  Spine: L-spine MRI 08/15/18 showed progressive epidural lipomatosis in the lower lumbar spine with progressive compression of the thecal sac and spinal stenosis at L3-4, L4-5, and L5-S1 compared to the prior study. Progressive facet degeneration and anterolisthesis at L4-5 since 2011.    MRI of the cervical spine performed 04/27/2017 showed  thoracic fusion hardware from C7 and below. There is mild spondylosis and disc bulging at C3-C4 through C6-C7. There does not appear to be any significant foraminal narrowing and there is no spinal stenosis.   Idiopthic intracranial Hypertension: He was diagnosed with idiopathic intracranial hypertension many years ago and had a VP shunt placed in 2007. A revision was performed July 2007 due to infection. He has a programmable Medtronic valve. Due to the increased headaches, it was reprogrammed from 1.5-1.0. Tapping of the shunt showed a pressure 160 (was drained to 140 mm). There was no infection. He also had an MRI of the brain and MR venogram. CT had shown the left-sided ventricular catheter extends into the right caudate head, similar to the previous study the MR venogram (11/12/2016) was reportedly normal. The left transverse and sigmoid sinuses are not well evaluated due to the shunt reservoir (but  also likely he is right dominant).   CT head 3/13 2020 personally reviewed and no evidence of shunt failure        REVIEW OF SYSTEMS: Constitutional: No fevers, chills, sweats, or change in appetite Eyes: No visual changes, double vision, eye pain Ear, nose and throat: No hearing loss, ear pain, nasal congestion, sore throat Cardiovascular: No chest pain, palpitations Respiratory:  No shortness of breath  at rest or with exertion.   No wheezes GastrointestinaI: No nausea, vomiting, diarrhea, abdominal pain, fecal incontinence Genitourinary:  No dysuria, urinary retention or frequency.  No nocturia. Musculoskeletal:  as above Integumentary: psoriasis on Humira Neurological: as above Psychiatric: No depression at this time.  No anxiety Endocrine: No palpitations, diaphoresis, change in appetite, change in weigh or increased thirst Hematologic/Lymphatic:  No anemia, purpura, petechiae. Allergic/Immunologic: No itchy/runny eyes, nasal congestion, recent allergic reactions, rashes  ALLERGIES: Allergies  Allergen Reactions   Bee Venom Anaphylaxis   Hydrocodone Bit-Homatrop Mbr Other (See Comments)    Hallucinations, confusion, delirium Depressed feeling   Toradol [Ketorolac Tromethamine] Other (See Comments)    Hallucinations, confusion, delirium   Prednisone     Patient reports it causes cirrhosis to flare up   Sulfadiazine     NDC KZLD:[id number] [place] ZESP:[id number] NDC FHLK:[id number]   Morphine And Related Other (See Comments)    Hallucinations, back in the 80s. States has taken vicodin before w/o problems    Sulfa Drugs Cross Reactors Rash    HOME MEDICATIONS:  Current Outpatient Medications:    acetaminophen (TYLENOL) 325 MG tablet, Take 2 tablets (650 mg total) by mouth every 4 (four) hours as needed for headache or mild pain., Disp: , Rfl:    Adalimumab (HUMIRA PEN) 40 MG/0.8ML PNKT, INJECT 40 MG (0.8 ML) UNDER THE SKIN EVERY OTHER WEEK., Disp: 2 each, Rfl: 5   amLODipine (NORVASC) 5 MG tablet, Take 1 tablet (5 mg total) by mouth daily., Disp: 90 tablet, Rfl: 3   aspirin 81 MG chewable tablet, Chew 1 tablet (81 mg total) by mouth daily., Disp: , Rfl:    atorvastatin (LIPITOR) 80 MG tablet, Take 1 tablet (80 mg total) by mouth at bedtime., Disp: 90 tablet, Rfl: 1   carvedilol (COREG) 12.5 MG tablet, Take 1 tablet (12.5 mg total) by mouth 2 (two) times daily with a meal.,  Disp: 180 tablet, Rfl: 0   clonazePAM (KLONOPIN) 0.5 MG tablet, TAKE 1 TABLET BY MOUTH EVERY NIGHT AT BEDTIME, Disp: 30 tablet, Rfl: 5   clopidogrel (PLAVIX) 75 MG tablet, TAKE 1 TABLET (75 MG TOTAL) BY MOUTH DAILY., Disp: 90 tablet, Rfl: 3   Continuous Blood Gluc Sensor (FREESTYLE LIBRE 14 DAY SENSOR) MISC, Apply 1 sensor every 14 (fourteen) days., Disp: 2 each, Rfl: 2   COVID-19 mRNA vaccine 2023-2024 (COMIRNATY) syringe, Inject into the muscle., Disp: 0.3 mL, Rfl: 0   DULoxetine (CYMBALTA) 60 MG capsule, Take 2 capsules (120 mg total) by mouth daily., Disp: 180 capsule, Rfl: 1   EPINEPHrine (EPIPEN 2-PAK) 0.3 mg/0.3 mL IJ SOAJ injection, Inject 0.3 mLs (0.3 mg total) into the muscle once as needed for up to 1 dose for anaphylaxis. (Patient not taking: Reported on 03/23/2022), Disp: 2 each, Rfl: 2   fenofibrate micronized (LOFIBRA) 134 MG capsule, TAKE 1 CAPSULE BY MOUTH THREE TIMES WEEKLY (Patient taking differently: Take 134 mg by mouth daily before breakfast.), Disp: 15 capsule, Rfl: 3   gabapentin (NEURONTIN) 600 MG tablet, Take 1 tablet (600 mg total) by mouth 2 (two) times  daily., Disp: 180 tablet, Rfl: 1   glimepiride (AMARYL) 4 MG tablet, TAKE 1 TABLET BY MOUTH ONCE DAILY BEFORE BREAKFAST, Disp: 30 tablet, Rfl: 3   isosorbide mononitrate (IMDUR) 60 MG 24 hr tablet, Take 1 tablet (60 mg total) by mouth daily., Disp: 30 tablet, Rfl: 1   metFORMIN (GLUCOPHAGE) 500 MG tablet, Take 2 tablets (1,000 mg total) by mouth 2 (two) times daily., Disp: 120 tablet, Rfl: 3   nitroGLYCERIN (NITROSTAT) 0.4 MG SL tablet, Place 1 tablet (0.4 mg total) under the tongue every 5 (five) minutes for 3 doses as needed for chest pain. (Patient not taking: Reported on 03/23/2022), Disp: 25 tablet, Rfl: 6   pantoprazole (PROTONIX) 40 MG tablet, Take 1 tablet (40 mg total) by mouth daily., Disp: 90 tablet, Rfl: 1   Semaglutide, 2 MG/DOSE, (OZEMPIC, 2 MG/DOSE,) 8 MG/3ML SOPN, Inject 2 mg into the skin weekly (Patient taking  differently: Inject 2 mg into the skin once a week. Inject 2 mg into the skin weekly), Disp: 9 mL, Rfl: 2  PAST MEDICAL HISTORY: Past Medical History:  Diagnosis Date   Abnormal cardiac CT angiography    Acid reflux    Annual physical exam 04/08/2015   Arthritis    Atypical chest pain 06/13/2020   Blood transfusion without reported diagnosis    Body mass index (BMI) 35.0-35.9, adult 04/05/2019   Chronic fatigue 01/28/2015   Chronic headaches    on cymbalta   Chronic migraine w/o aura, not intractable, w/o stat migr 10/24/2018   Cirrhosis ([place])    Colon polyps    Complication of anesthesia    problems waking up from anesthesia   Coronary artery disease 01/23/2019   Depression    on cymbalta   Diabetes mellitus with neuropathy ([place])    Diabetes with neuropathy 04/25/2013   Diverticulitis 03/2013   Dyslipidemia 05/29/2019   Eczema    Elevated LFTs    Epidural lipomatosis 10/05/2018   Essential (primary) hypertension 04/05/2019   Essential hypertension 10/10/2019   Fatty liver    GERD (gastroesophageal reflux disease) 04/28/2011   H/O craniotomy 05/07/2015   Headache 04/28/2011   Hepatitis 10/2017   NASH cirrhosis   History of kidney stones    Hyperlipidemia    Hypersomnia with sleep apnea 01/28/2015   Hypertension    IDA (iron deficiency anemia) 01/24/2019   Idiopathic intracranial hypertension 01/14/2017   Insomnia 04/26/2013   Kidney stone    Liver cirrhosis secondary to NASH (nonalcoholic steatohepatitis) ([place]) 01/02/2016   Low back pain 04/05/2019   Lower back injury 08/14/2019   Morbid obesity ([place])    Neuromuscular disorder ([place])    neuropathy   Neuropathy    Nonalcoholic steatohepatitis 11/04/5275   Obstructive hydrocephalus ([place]) 01/28/2015   OSA -- dx ~ 2012, cpap intolerant 09/04/2014    dx ~ 2012, cpap intolerant    PCP NOTES >>> 04/08/2015   Post-op pain 03/19/2019   Post-traumatic hydrocephalus ([place])    s/p shunts x 2 (first got infected )   Presence of cerebrospinal  fluid drainage device 07/28/2011   Psoriasis    sees Dr [name]   Psoriatic arthritis ([place])    REM behavioral disorder 01/14/2017   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   see's Dr.Ortmann   Severe obesity (BMI >= 40) ([place]) 01/28/2015   SI (sacroiliac) joint dysfunction 08/14/2019   Sigmoid diverticulitis 04/25/2013   Sleep apnea    no CPAP      Spondylolisthesis, lumbar region 03/16/2019  Stomach ulcer    Testosterone deficiency 04/28/2011   VP (ventriculoperitoneal) shunt status 07/31/2020    PAST SURGICAL HISTORY: Past Surgical History:  Procedure Laterality Date   AMPUTATION Left 12/27/2021   Procedure: AMPUTATION GREAT TOE;  Surgeon: Trula Slade, DPM;  Location: [facility];  Service: Podiatry;  Laterality: Left;   BACK SURGERY  1980   BRAIN SURGERY     VP shunts placed in 2007   CHOLECYSTECTOMY N/A 08/25/2017   Procedure: LAPAROSCOPIC CHOLECYSTECTOMY WITH INTRAOPERATIVE CHOLANGIOGRAM;  Surgeon: [name], MD;  Location: [facility];  Service: General;  Laterality: N/A;   COLONOSCOPY     CORONARY STENT INTERVENTION N/A 09/27/2020   Procedure: CORONARY STENT INTERVENTION;  Surgeon: Jettie Booze, MD;  Location: [facility];  Service: Cardiovascular;  Laterality: N/A;   INTRAVASCULAR ULTRASOUND/IVUS N/A 09/27/2020   Procedure: Intravascular Ultrasound/IVUS;  Surgeon: Jettie Booze, MD;  Location: [facility];  Service: Cardiovascular;  Laterality: N/A;   JOINT REPLACEMENT     total hip   LEFT HEART CATH N/A 09/27/2020   Procedure: Left Heart Cath;  Surgeon: Jettie Booze, MD;  Location: [facility];  Service: Cardiovascular;  Laterality: N/A;   LEFT HEART CATH AND CORONARY ANGIOGRAPHY N/A 09/24/2020   Procedure: LEFT HEART CATH AND CORONARY ANGIOGRAPHY;  Surgeon: Jettie Booze, MD;  Location: [facility];  Service: Cardiovascular;  Laterality: N/A;   LEFT HEART CATH AND CORONARY ANGIOGRAPHY N/A 08/18/2021   Procedure:  LEFT HEART CATH AND CORONARY ANGIOGRAPHY;  Surgeon: Troy Sine, MD;  Location: [facility];  Service: Cardiovascular;  Laterality: N/A;   LUMBAR FUSION  03/16/2019   SHOULDER SURGERY Left 2010   TEE WITHOUT CARDIOVERSION N/A 01/01/2022   Procedure: TRANSESOPHAGEAL ECHOCARDIOGRAM (TEE);  Surgeon: Berniece Salines, DO;  Location: [facility];  Service: Cardiovascular;  Laterality: N/A;   TOE SURGERY Left 2018   TONSILLECTOMY     as a child   TOTAL HIP ARTHROPLASTY Left 2011   UPPER GASTROINTESTINAL ENDOSCOPY  01/04/2020   VENTRICULOPERITONEAL SHUNT  2007   x2    FAMILY HISTORY: Family History  Problem Relation Age of Onset   Other Mother    Lung cancer Father        alive, former smoker    Heart disease Brother        MI age 34   Other Brother        Murdered   Down syndrome Son    Diabetes Neg Hx    Prostate cancer Neg Hx    Colon cancer Neg Hx    Stomach cancer Neg Hx    Pancreatic cancer Neg Hx    Liver disease Neg Hx     SOCIAL HISTORY:  Social History   Socioeconomic History   Marital status: Married    Spouse name: [name]   Number of children: 2   Years of education: Not on file   Highest education level: Not on file  Occupational History   Occupation: disabled   Tobacco Use   Smoking status: Never   Smokeless tobacco: Never  Vaping Use   Vaping Use: Never used  Substance and Sexual Activity   Alcohol use: No   Drug use: No   Sexual activity: Yes    Partners: Female  Other Topics Concern   Not on file  Social History Narrative   Household-- pt , wife, one adult son with Down's syndrome   younger son  lives in [place] 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   Social Determinants of Health   Financial Resource Strain: Low Risk  (03/18/2021)   Overall Financial Resource Strain (CARDIA)    Difficulty of Paying Living Expenses: Not hard at all  Food Insecurity: No Food Insecurity (01/22/2022)   Hunger Vital  Sign    Worried About Running Out of Food in the Last Year: Never true    Ran Out of Food in the Last Year: Never true  Transportation Needs: No Transportation Needs (01/22/2022)   PRAPARE - Hydrologist (Medical): No    Lack of Transportation (Non-Medical): No  Physical Activity: Inactive (03/18/2021)   Exercise Vital Sign    Days of Exercise per Week: 0 days    Minutes of Exercise per Session: 0 min  Stress: No Stress Concern Present (03/18/2021)   [place]    Feeling of Stress : Not at all  Social Connections: Moderately Integrated (03/18/2021)   Social Connection and Isolation Panel [NHANES]    Frequency of Communication with Friends and Family: More than three times a week    Frequency of Social Gatherings with Friends and Family: Once a week    Attends Religious Services: Never    Marine scientist or Organizations: Yes    Attends Archivist Meetings: 1 to 4 times per year    Marital Status: Married  Human resources officer Violence: Not At Risk (01/22/2022)   Humiliation, Afraid, Rape, and Kick questionnaire    Fear of Current or Ex-Partner: No    Emotionally Abused: No    Physically Abused: No    Sexually Abused: No     PHYSICAL EXAM Today's Vitals   05/04/22 1403  BP: 129/87  Pulse: 79  Height: 5' 11"  (1.803 m)    Body mass index is 33.77 kg/m.     General: The patient is well-developed and well-nourished and in no acute distress,     Neck: He has moderate tenderness over the splenis capitis, splenius cervicis in the upper cervical paraspinal muscles...    Skin: Extremities are without significant edema.  Neurologic Exam  Mental status: The patient is alert and oriented x 3 at the time of the examination. The patient has apparent normal recent and remote memory, with an apparently normal attention span and concentration ability.   Speech is normal.  Cranial  nerves: Extraocular movements are full.  Facial strength and sensation is normal.  Neck strength is normal.  Motor: 5/5 strength  Coordination: Cerebellar testing shows good finger-nose-finger  Gait and station: Station is normal.  The gait is arthritic.  Tandem gait is wide.  Romberg is negative.     DIAGNOSTIC DATA (LABS, IMAGING, TESTING) - I reviewed patient records, labs, notes, testing and imaging myself where available.  Lab Results  Component Value Date   WBC 4.1 02/09/2022   HGB 12.0 (L) 02/09/2022   HCT 36.6 (L) 02/09/2022   MCV 88 02/09/2022   PLT 174 02/09/2022      Component Value Date/Time   NA 141 01/30/2022 1007   NA 139 09/16/2020 1724   K 4.6 01/30/2022 1007   CL 107 01/30/2022 1007   CO2 26 01/30/2022 1007   GLUCOSE 118 (H) 01/30/2022 1007   BUN 15 01/30/2022 1007   BUN 21 09/16/2020 1724   CREATININE 1.27 01/30/2022 1007   CALCIUM 9.3 01/30/2022  1007   PROT 7.7 01/30/2022 1007   ALBUMIN 4.3 01/30/2022 1007   AST 26 01/30/2022 1007   ALT 25 01/30/2022 1007   ALKPHOS 89 01/30/2022 1007   BILITOT 1.5 (H) 01/30/2022 1007   GFRNONAA 52 (L) 01/01/2022 0315   GFRAA 66 08/08/2020 1442   Lab Results  Component Value Date   CHOL 137 01/30/2022   HDL 42.10 01/30/2022   LDLCALC 68 01/30/2022   LDLDIRECT 67.0 12/31/2020   TRIG 137.0 01/30/2022   CHOLHDL 3 01/30/2022   Lab Results  Component Value Date   HGBA1C 7.1 (H) 01/30/2022   Lab Results  Component Value Date   [id number] 825 06/10/2018   Lab Results  Component Value Date   TSH 1.22 06/26/2021       ASSESSMENT AND PLAN  Neck pain  Chronic migraine w/o aura, not intractable, w/o stat migr  OSA -- dx ~ 2012, cpap intolerant  REM behavioral disorder  1.  Botox 155 units as follows: Frontalis muscle (5 units x 4), corrugators and procerus (5 units x 3), temporalis (5 units x 8), occipitalis (5 units x 4), splenius capitis (12.5 units x 2), splenius cervicis (10 units x 2), C6-C7  paraspinal (5 units x 2), trapezius (12.5 units x 2).  45 units wasted 2.    He was unable to tolerate CPAP.  We have discussed the inspire device but he is not interested at this time. 3.   Continue clonazepam 0.5 mg for RBD and insomnia 4.   rtc 6 months, sooner if new or worsening issues.  Charline Hoskinson A. Felecia Shelling, MD, [facility] 74/02/3551, 1:74 PM Certified in Neurology, Clinical Neurophysiology, Sleep Medicine, Pain Medicine and Neuroimaging  [facility] [address], [place], [place] [address] [phone]

## 2022-05-04 NOTE — Telephone Encounter (Signed)
Needs Botox re-auth asap, has appt today at 1:30pm  Chronic Migraine CPT 64615    Botox J0585 Units:200   G43.709 Chronic migraine w/o aura, not intractable, w/o stat migraine

## 2022-05-04 NOTE — Telephone Encounter (Signed)
Pt's authorization for botox expired 03/20/22 and has an appointment for today. Please advise as to when this pt could be worked in for rescheduling

## 2022-05-04 NOTE — Telephone Encounter (Signed)
BotoxOne Benefit verification submitted   Key: [id number]

## 2022-05-04 NOTE — Telephone Encounter (Signed)
Submitted an expedited Prior Authorization request to  [facility] management  for  Botox 200 units  via Fax. Will update once we receive a response.  Estimated turn around time is 72 hours.  Updated Botox One report has also been submitted.   Fax# [phone] Phone# [phone]

## 2022-05-06 ENCOUNTER — Other Ambulatory Visit (HOSPITAL_COMMUNITY): Payer: Self-pay

## 2022-05-07 ENCOUNTER — Other Ambulatory Visit (HOSPITAL_COMMUNITY): Payer: Self-pay

## 2022-05-08 ENCOUNTER — Other Ambulatory Visit: Payer: Self-pay | Admitting: Cardiology

## 2022-05-08 ENCOUNTER — Other Ambulatory Visit (HOSPITAL_BASED_OUTPATIENT_CLINIC_OR_DEPARTMENT_OTHER): Payer: Self-pay

## 2022-05-08 DIAGNOSIS — E1165 Type 2 diabetes mellitus with hyperglycemia: Secondary | ICD-10-CM

## 2022-05-08 MED ORDER — METFORMIN HCL 500 MG PO TABS
1000.0000 mg | ORAL_TABLET | Freq: Two times a day (BID) | ORAL | 3 refills | Status: DC
  Filled 2022-05-08: qty 120, 30d supply, fill #0
  Filled 2022-06-25: qty 360, 90d supply, fill #1
  Filled 2022-06-26: qty 120, 30d supply, fill #1
  Filled 2022-07-21: qty 120, 30d supply, fill #2
  Filled 2022-09-07: qty 120, 30d supply, fill #3

## 2022-05-11 ENCOUNTER — Other Ambulatory Visit (INDEPENDENT_AMBULATORY_CARE_PROVIDER_SITE_OTHER): Payer: 59

## 2022-05-11 DIAGNOSIS — E1165 Type 2 diabetes mellitus with hyperglycemia: Secondary | ICD-10-CM

## 2022-05-11 LAB — BASIC METABOLIC PANEL
BUN: 32 mg/dL — ABNORMAL HIGH (ref 6–23)
CO2: 23 mEq/L (ref 19–32)
Calcium: 9.8 mg/dL (ref 8.4–10.5)
Chloride: 103 mEq/L (ref 96–112)
Creatinine, Ser: 1.64 mg/dL — ABNORMAL HIGH (ref 0.40–1.50)
GFR: 44.03 mL/min — ABNORMAL LOW (ref >60.00)
Glucose, Bld: 138 mg/dL — ABNORMAL HIGH (ref 70–99)
Potassium: 4.5 mEq/L (ref 3.5–5.1)
Sodium: 137 mEq/L (ref 135–145)

## 2022-05-11 LAB — HEMOGLOBIN A1C: Hgb A1c MFr Bld: 7.1 % — ABNORMAL HIGH (ref 4.6–6.5)

## 2022-05-12 ENCOUNTER — Other Ambulatory Visit (HOSPITAL_COMMUNITY): Payer: Self-pay

## 2022-05-12 MED ORDER — ONABOTULINUMTOXINA 200 UNITS IJ SOLR
INTRAMUSCULAR | 2 refills | Status: DC
  Filled 2022-05-12: qty 1, 84d supply, fill #0
  Filled 2022-05-12: qty 1, fill #0
  Filled 2022-07-22: qty 1, 84d supply, fill #1

## 2022-05-12 NOTE — Telephone Encounter (Addendum)
E-scribed rx to [facility].  Called pt at [phone]. LVM for him to call office.  Called pt at [phone]. Scheduled botox appt for 05/19/22 at 2:00pm with Dr. Felecia Shelling. Advised him to be on look out for phone call from [facility].

## 2022-05-12 NOTE — Telephone Encounter (Signed)
Patient Advocate Encounter  Prior Authorization for Botox 200UNIT solution  has been approved.    PA# [id number] Effective dates: 05/12/2022 through 11/08/2022  Must be filled at [facility], [place] Patient [place] Patient Advocate Team Direct Number: [phone]  Fax: [phone]

## 2022-05-12 NOTE — Addendum Note (Signed)
Addended by: Wyvonnia Lora on: 05/12/2022 02:10 PM   Modules accepted: Orders

## 2022-05-12 NOTE — Telephone Encounter (Signed)
Patient Advocate Encounter   Received notification that prior authorization for Botox 200UNIT solution is required. Submitted as Urgent.   PA submitted on 05/12/2022 Key [id number] Status is pending       Lyndel Safe, [place] Patient Advocate Specialist [place] Patient Advocate Team Direct Number: [phone]  Fax: [phone]

## 2022-05-13 ENCOUNTER — Other Ambulatory Visit (HOSPITAL_COMMUNITY): Payer: Self-pay

## 2022-05-15 ENCOUNTER — Other Ambulatory Visit (HOSPITAL_COMMUNITY): Payer: Self-pay

## 2022-05-19 ENCOUNTER — Encounter: Payer: Self-pay | Admitting: Neurology

## 2022-05-19 ENCOUNTER — Ambulatory Visit (INDEPENDENT_AMBULATORY_CARE_PROVIDER_SITE_OTHER): Payer: 59 | Admitting: Neurology

## 2022-05-19 VITALS — BP 136/87 | HR 67 | Ht 71.0 in | Wt 241.0 lb

## 2022-05-19 DIAGNOSIS — Z982 Presence of cerebrospinal fluid drainage device: Secondary | ICD-10-CM | POA: Diagnosis not present

## 2022-05-19 DIAGNOSIS — G43709 Chronic migraine without aura, not intractable, without status migrainosus: Secondary | ICD-10-CM

## 2022-05-19 DIAGNOSIS — M542 Cervicalgia: Secondary | ICD-10-CM

## 2022-05-19 DIAGNOSIS — G4733 Obstructive sleep apnea (adult) (pediatric): Secondary | ICD-10-CM

## 2022-05-19 NOTE — Progress Notes (Signed)
[facility]  PATIENT: James [name] DOB: Nov 23, 1955  REFERRING DOCTOR OR PCP:  Kathlene November  SOURCE: patient, notes from Dr. [name], imaging results and MRI and CT scans on PACS personally reviewed  _________________________________   HISTORICAL  CHIEF COMPLAINT:  Chief Complaint  Patient presents with   Botulinum Toxin Injection    RM 2, alone. Botox doing well.    HISTORY OF PRESENT ILLNESS:  James [name] is a 66 y.o. man with idiopathic intracranial hypertension and recent increase in the frequency and severity of headache  Update 05/19/2022:  His headaches have generally done well with Botox therapy.  He usually gets a benefit for 2 and half to 3 months.  2 weeks ago, he was due for the Botox but due to insurance issues he was unable to get it at that time.  I did do a splenius capitis trigger point injection to help the headache he was experiencing and he felt that it helped him a lot for a week and is still helping some  Before Botox he was having daily headaches that were more severe.  Neck pain is also doing better.    When pain is present, it is frontal more than occipital and often it is more to the right.  At times he gets nausea but does not have vomiting.  When the headaches more severe he has photophobia and phonophobia. Sometimes, headaches improve when he is laying down but other times they do not change between sitting and lying down. Moving usually worsens the headaches.  He did not get a benefit from multiple prophylactic medications including Keppra, gabapentin, Emgality, Cymbalta.    Besides the chronic migraine, he has obstructive sleep apnea but was unable to tolerate CPAP.  We had discussed the inspire device but he is not interested at this time..  We also discussed that he should try to lose some more weight.   His REM behavior disorder has done well on nighttime clonazepam.  In early 2022, he had 3 cardiac stents after being diagnosed with  CAD after presenting with chest pain.   He had a small MI.    He is also on Humira for psoriatis.  He has Karlene Lineman related cirrhosis.  Chronic Migraine History: He was having daily HA with migrainous features.   He did not get a benefit from multiple prophylactic medications including Keppra, gabapentin, Emgality, Cymbalta.   Botox therapy started July 2021.  Spine: L-spine MRI 08/15/18 showed progressive epidural lipomatosis in the lower lumbar spine with progressive compression of the thecal sac and spinal stenosis at L3-4, L4-5, and L5-S1 compared to the prior study. Progressive facet degeneration and anterolisthesis at L4-5 since 2011.    MRI of the cervical spine performed 04/27/2017 showed  thoracic fusion hardware from C7 and below. There is mild spondylosis and disc bulging at C3-C4 through C6-C7. There does not appear to be any significant foraminal narrowing and there is no spinal stenosis.   Idiopthic intracranial Hypertension: He was diagnosed with idiopathic intracranial hypertension many years ago and had a VP shunt placed in 2007. A revision was performed July 2007 due to infection. He has a programmable Medtronic valve. Due to the increased headaches, it was reprogrammed from 1.5-1.0. Tapping of the shunt showed a pressure 160 (was drained to 140 mm). There was no infection. He also had an MRI of the brain and MR venogram. CT had shown the left-sided ventricular catheter extends into the right caudate head, similar to the  previous study the MR venogram (11/12/2016) was reportedly normal. The left transverse and sigmoid sinuses are not well evaluated due to the shunt reservoir (but also likely he is right dominant).   CT head 3/13 2020 personally reviewed and no evidence of shunt failure        REVIEW OF SYSTEMS: Constitutional: No fevers, chills, sweats, or change in appetite Eyes: No visual changes, double vision, eye pain Ear, nose and throat: No hearing loss, ear pain, nasal  congestion, sore throat Cardiovascular: No chest pain, palpitations Respiratory:  No shortness of breath at rest or with exertion.   No wheezes GastrointestinaI: No nausea, vomiting, diarrhea, abdominal pain, fecal incontinence Genitourinary:  No dysuria, urinary retention or frequency.  No nocturia. Musculoskeletal:  as above Integumentary: psoriasis on Humira Neurological: as above Psychiatric: No depression at this time.  No anxiety Endocrine: No palpitations, diaphoresis, change in appetite, change in weigh or increased thirst Hematologic/Lymphatic:  No anemia, purpura, petechiae. Allergic/Immunologic: No itchy/runny eyes, nasal congestion, recent allergic reactions, rashes  ALLERGIES: Allergies  Allergen Reactions   Bee Venom Anaphylaxis   Hydrocodone Bit-Homatrop Mbr Other (See Comments)    Hallucinations, confusion, delirium Depressed feeling   Toradol [Ketorolac Tromethamine] Other (See Comments)    Hallucinations, confusion, delirium   Prednisone     Patient reports it causes cirrhosis to flare up   Sulfadiazine     NDC MVHQ:[id number] [place] LKGM:[id number] NDC QIHK:[id number]   Morphine And Related Other (See Comments)    Hallucinations, back in the 80s. States has taken vicodin before w/o problems    Sulfa Drugs Cross Reactors Rash    HOME MEDICATIONS:  Current Outpatient Medications:    acetaminophen (TYLENOL) 325 MG tablet, Take 2 tablets (650 mg total) by mouth every 4 (four) hours as needed for headache or mild pain., Disp: , Rfl:    Adalimumab (HUMIRA PEN) 40 MG/0.8ML PNKT, INJECT 40 MG (0.8 ML) UNDER THE SKIN EVERY OTHER WEEK., Disp: 2 each, Rfl: 5   amLODipine (NORVASC) 5 MG tablet, Take 1 tablet (5 mg total) by mouth daily., Disp: 90 tablet, Rfl: 3   aspirin 81 MG chewable tablet, Chew 1 tablet (81 mg total) by mouth daily., Disp: , Rfl:    atorvastatin (LIPITOR) 80 MG tablet, Take 1 tablet (80 mg total) by mouth at bedtime., Disp: 90 tablet, Rfl: 1    botulinum toxin Type A (BOTOX) 200 units injection, Provider to inject 155 units into the muscles of the head and neck every 3 months. Discard remainder., Disp: 1 each, Rfl: 2   carvedilol (COREG) 12.5 MG tablet, Take 1 tablet (12.5 mg total) by mouth 2 (two) times daily with a meal., Disp: 180 tablet, Rfl: 0   clonazePAM (KLONOPIN) 0.5 MG tablet, TAKE 1 TABLET BY MOUTH EVERY NIGHT AT BEDTIME, Disp: 30 tablet, Rfl: 5   clopidogrel (PLAVIX) 75 MG tablet, TAKE 1 TABLET (75 MG TOTAL) BY MOUTH DAILY., Disp: 90 tablet, Rfl: 3   Continuous Blood Gluc Sensor (FREESTYLE LIBRE 14 DAY SENSOR) MISC, Apply 1 sensor every 14 (fourteen) days., Disp: 2 each, Rfl: 2   COVID-19 mRNA vaccine 2023-2024 (COMIRNATY) syringe, Inject into the muscle., Disp: 0.3 mL, Rfl: 0   DULoxetine (CYMBALTA) 60 MG capsule, Take 2 capsules (120 mg total) by mouth daily., Disp: 180 capsule, Rfl: 1   EPINEPHrine (EPIPEN 2-PAK) 0.3 mg/0.3 mL IJ SOAJ injection, Inject 0.3 mLs (0.3 mg total) into the muscle once as needed for up to 1 dose for anaphylaxis., Disp: 2 each,  Rfl: 2   fenofibrate micronized (LOFIBRA) 134 MG capsule, TAKE 1 CAPSULE BY MOUTH THREE TIMES WEEKLY (Patient taking differently: Take 134 mg by mouth daily before breakfast.), Disp: 15 capsule, Rfl: 3   gabapentin (NEURONTIN) 600 MG tablet, Take 1 tablet (600 mg total) by mouth 2 (two) times daily., Disp: 180 tablet, Rfl: 1   glimepiride (AMARYL) 4 MG tablet, TAKE 1 TABLET BY MOUTH ONCE DAILY BEFORE BREAKFAST, Disp: 30 tablet, Rfl: 3   isosorbide mononitrate (IMDUR) 60 MG 24 hr tablet, Take 1 tablet (60 mg total) by mouth daily., Disp: 30 tablet, Rfl: 1   metFORMIN (GLUCOPHAGE) 500 MG tablet, Take 2 tablets (1,000 mg total) by mouth 2 (two) times daily., Disp: 120 tablet, Rfl: 3   nitroGLYCERIN (NITROSTAT) 0.4 MG SL tablet, Place 1 tablet (0.4 mg total) under the tongue every 5 (five) minutes for 3 doses as needed for chest pain., Disp: 25 tablet, Rfl: 6   pantoprazole  (PROTONIX) 40 MG tablet, Take 1 tablet (40 mg total) by mouth daily., Disp: 90 tablet, Rfl: 1   Semaglutide, 2 MG/DOSE, (OZEMPIC, 2 MG/DOSE,) 8 MG/3ML SOPN, Inject 2 mg into the skin weekly (Patient taking differently: Inject 2 mg into the skin once a week. Inject 2 mg into the skin weekly), Disp: 9 mL, Rfl: 2  PAST MEDICAL HISTORY: Past Medical History:  Diagnosis Date   Abnormal cardiac CT angiography    Acid reflux    Annual physical exam 04/08/2015   Arthritis    Atypical chest pain 06/13/2020   Blood transfusion without reported diagnosis    Body mass index (BMI) 35.0-35.9, adult 04/05/2019   Chronic fatigue 01/28/2015   Chronic headaches    on cymbalta   Chronic migraine w/o aura, not intractable, w/o stat migr 10/24/2018   Cirrhosis ([place])    Colon polyps    Complication of anesthesia    problems waking up from anesthesia   Coronary artery disease 01/23/2019   Depression    on cymbalta   Diabetes mellitus with neuropathy ([place])    Diabetes with neuropathy 04/25/2013   Diverticulitis 03/2013   Dyslipidemia 05/29/2019   Eczema    Elevated LFTs    Epidural lipomatosis 10/05/2018   Essential (primary) hypertension 04/05/2019   Essential hypertension 10/10/2019   Fatty liver    GERD (gastroesophageal reflux disease) 04/28/2011   H/O craniotomy 05/07/2015   Headache 04/28/2011   Hepatitis 10/2017   NASH cirrhosis   History of kidney stones    Hyperlipidemia    Hypersomnia with sleep apnea 01/28/2015   Hypertension    IDA (iron deficiency anemia) 01/24/2019   Idiopathic intracranial hypertension 01/14/2017   Insomnia 04/26/2013   Kidney stone    Liver cirrhosis secondary to NASH (nonalcoholic steatohepatitis) ([place]) 01/02/2016   Low back pain 04/05/2019   Lower back injury 08/14/2019   Morbid obesity ([place])    Neuromuscular disorder ([place])    neuropathy   Neuropathy    Nonalcoholic steatohepatitis 08/04/3333   Obstructive hydrocephalus ([place]) 01/28/2015   OSA -- dx ~ 2012, cpap intolerant  09/04/2014    dx ~ 2012, cpap intolerant    PCP NOTES >>> 04/08/2015   Post-op pain 03/19/2019   Post-traumatic hydrocephalus ([facility])    s/p shunts x 2 (first got infected )   Presence of cerebrospinal fluid drainage device 07/28/2011   Psoriasis    sees Dr [name]   Psoriatic arthritis ([place])    REM behavioral disorder 01/14/2017   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   see's Dr.Ortmann   Severe obesity (BMI >= 40) ([place]) 01/28/2015   SI (sacroiliac) joint dysfunction 08/14/2019   Sigmoid diverticulitis 04/25/2013   Sleep apnea    no CPAP      Spondylolisthesis, lumbar region 03/16/2019   Stomach ulcer    Testosterone deficiency 04/28/2011   VP (ventriculoperitoneal) shunt status 07/31/2020    PAST SURGICAL HISTORY: Past Surgical History:  Procedure Laterality Date   AMPUTATION Left 12/27/2021   Procedure: AMPUTATION GREAT TOE;  Surgeon: Trula Slade, DPM;  Location: [facility];  Service: Podiatry;  Laterality: Left;   BACK SURGERY  1980   BRAIN SURGERY     VP shunts placed in 2007   CHOLECYSTECTOMY N/A 08/25/2017   Procedure: LAPAROSCOPIC CHOLECYSTECTOMY WITH INTRAOPERATIVE CHOLANGIOGRAM;  Surgeon: [name], MD;  Location: [facility];  Service: General;  Laterality: N/A;   COLONOSCOPY     CORONARY STENT INTERVENTION N/A 09/27/2020   Procedure: CORONARY STENT INTERVENTION;  Surgeon: Jettie Booze, MD;  Location: [facility];  Service: Cardiovascular;  Laterality: N/A;   INTRAVASCULAR ULTRASOUND/IVUS N/A 09/27/2020   Procedure: Intravascular Ultrasound/IVUS;  Surgeon: Jettie Booze, MD;  Location: [facility];  Service: Cardiovascular;  Laterality: N/A;   JOINT REPLACEMENT     total hip   LEFT HEART CATH N/A 09/27/2020   Procedure: Left Heart Cath;  Surgeon: Jettie Booze, MD;  Location: [facility];  Service: Cardiovascular;  Laterality: N/A;   LEFT HEART CATH AND CORONARY ANGIOGRAPHY N/A 09/24/2020   Procedure: LEFT HEART CATH AND  CORONARY ANGIOGRAPHY;  Surgeon: Jettie Booze, MD;  Location: [facility];  Service: Cardiovascular;  Laterality: N/A;   LEFT HEART CATH AND CORONARY ANGIOGRAPHY N/A 08/18/2021   Procedure: LEFT HEART CATH AND CORONARY ANGIOGRAPHY;  Surgeon: Troy Sine, MD;  Location: [facility];  Service: Cardiovascular;  Laterality: N/A;   LUMBAR FUSION  03/16/2019   SHOULDER SURGERY Left 2010   TEE WITHOUT CARDIOVERSION N/A 01/01/2022   Procedure: TRANSESOPHAGEAL ECHOCARDIOGRAM (TEE);  Surgeon: Berniece Salines, DO;  Location: [facility];  Service: Cardiovascular;  Laterality: N/A;   TOE SURGERY Left 2018   TONSILLECTOMY     as a child   TOTAL HIP ARTHROPLASTY Left 2011   UPPER GASTROINTESTINAL ENDOSCOPY  01/04/2020   VENTRICULOPERITONEAL SHUNT  2007   x2    FAMILY HISTORY: Family History  Problem Relation Age of Onset   Other Mother    Lung cancer Father        alive, former smoker    Heart disease Brother        MI age 42   Other Brother        Murdered   Down syndrome Son    Diabetes Neg Hx    Prostate cancer Neg Hx    Colon cancer Neg Hx    Stomach cancer Neg Hx    Pancreatic cancer Neg Hx    Liver disease Neg Hx     SOCIAL HISTORY:  Social History   Socioeconomic History   Marital status: Married    Spouse name: [name]   Number of children: 2   Years of education: Not on file   Highest education level: Not on file  Occupational History   Occupation: disabled   Tobacco Use   Smoking status: Never   Smokeless tobacco: Never  Vaping Use   Vaping Use: Never used  Substance and Sexual Activity  Alcohol use: No   Drug use: No   Sexual activity: Yes    Partners: Female  Other Topics Concern   Not on file  Social History Narrative   Household-- pt , wife, one adult son with Down's syndrome   younger son lives in [place] 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   Social Determinants of Health   Financial  Resource Strain: Low Risk  (03/18/2021)   Overall Financial Resource Strain (CARDIA)    Difficulty of Paying Living Expenses: Not hard at all  Food Insecurity: No Food Insecurity (01/22/2022)   Hunger Vital Sign    Worried About Running Out of Food in the Last Year: Never true    Ran Out of Food in the Last Year: Never true  Transportation Needs: No Transportation Needs (01/22/2022)   PRAPARE - Hydrologist (Medical): No    Lack of Transportation (Non-Medical): No  Physical Activity: Inactive (03/18/2021)   Exercise Vital Sign    Days of Exercise per Week: 0 days    Minutes of Exercise per Session: 0 min  Stress: No Stress Concern Present (03/18/2021)   [place]    Feeling of Stress : Not at all  Social Connections: Moderately Integrated (03/18/2021)   Social Connection and Isolation Panel [NHANES]    Frequency of Communication with Friends and Family: More than three times a week    Frequency of Social Gatherings with Friends and Family: Once a week    Attends Religious Services: Never    Marine scientist or Organizations: Yes    Attends Archivist Meetings: 1 to 4 times per year    Marital Status: Married  Human resources officer Violence: Not At Risk (01/22/2022)   Humiliation, Afraid, Rape, and Kick questionnaire    Fear of Current or Ex-Partner: No    Emotionally Abused: No    Physically Abused: No    Sexually Abused: No     PHYSICAL EXAM Today's Vitals   05/19/22 1345  BP: 136/87  Pulse: 67  Weight: 241 lb (109.3 kg)  Height: 5' 11"  (1.803 m)    Body mass index is 33.61 kg/m.     General: The patient is well-developed and well-nourished and in no acute distress,     Neck: He has mild tenderness at the occiput.  Good range of motion..    Skin: Extremities are without significant edema.  Psoriasis is well controlled.  Neurologic Exam  Mental status: The  patient is alert and oriented x 3 at the time of the examination. The patient has apparent normal recent and remote memory, with an apparently normal attention span and concentration ability.   Speech is normal.  Cranial nerves: Extraocular movements are full.  Facial strength and sensation is normal.  Neck strength is normal.  Hearing is normal.  Motor: 5/5 strength  Coordination: Cerebellar testing shows good finger-nose-finger  Gait and station: Station is normal.  The gait is arthritic.  Tandem gait is wide.  Romberg is negative.     DIAGNOSTIC DATA (LABS, IMAGING, TESTING) - I reviewed patient records, labs, notes, testing and imaging myself where available.  Lab Results  Component Value Date   WBC 4.1 02/09/2022   HGB 12.0 (L) 02/09/2022   HCT 36.6 (L) 02/09/2022   MCV 88 02/09/2022   PLT 174 02/09/2022      Component Value Date/Time  NA 137 05/11/2022 1032   NA 139 09/16/2020 1724   K 4.5 05/11/2022 1032   CL 103 05/11/2022 1032   CO2 23 05/11/2022 1032   GLUCOSE 138 (H) 05/11/2022 1032   BUN 32 (H) 05/11/2022 1032   BUN 21 09/16/2020 1724   CREATININE 1.64 (H) 05/11/2022 1032   CALCIUM 9.8 05/11/2022 1032   PROT 7.7 01/30/2022 1007   ALBUMIN 4.3 01/30/2022 1007   AST 26 01/30/2022 1007   ALT 25 01/30/2022 1007   ALKPHOS 89 01/30/2022 1007   BILITOT 1.5 (H) 01/30/2022 1007   GFRNONAA 52 (L) 01/01/2022 0315   GFRAA 66 08/08/2020 1442   Lab Results  Component Value Date   CHOL 137 01/30/2022   HDL 42.10 01/30/2022   LDLCALC 68 01/30/2022   LDLDIRECT 67.0 12/31/2020   TRIG 137.0 01/30/2022   CHOLHDL 3 01/30/2022   Lab Results  Component Value Date   HGBA1C 7.1 (H) 05/11/2022   Lab Results  Component Value Date   VITAMINB12 490 06/10/2018   Lab Results  Component Value Date   TSH 1.22 06/26/2021       ASSESSMENT AND PLAN  Chronic migraine w/o aura, not intractable, w/o stat migr  Neck pain  OSA -- dx ~ 2012, cpap intolerant  Presence  of cerebrospinal fluid drainage device  1.  Botox 155 units units as follows: Frontalis muscle (5 units x 4), corrugators and procerus (5 units x 3), temporalis (5 units x 8), occipitalis (5 units x 4), splenius capitis (12.5 units x 2), splenius cervicis (10 units x 2), C6-C7 paraspinal (5 units x 2), trapezius (12.5 units x 2).  45 units wasted.   2.    He was unable to tolerate CPAP.  We have discussed the inspire device but he is not interested at this time. 3.   Continue clonazepam 0.5 mg for RBD and insomnia 4.   rtc 6 months, sooner if new or worsening issues.  Talitha Dicarlo A. Felecia Shelling, MD, [facility] 16/94/5038, 8:82 PM Certified in Neurology, Clinical Neurophysiology, Sleep Medicine, Pain Medicine and Neuroimaging  [facility] [address] [place],  [address] [phone]

## 2022-05-22 ENCOUNTER — Other Ambulatory Visit: Payer: 59

## 2022-05-26 ENCOUNTER — Ambulatory Visit: Payer: 59 | Admitting: Endocrinology

## 2022-05-26 ENCOUNTER — Encounter: Payer: Self-pay | Admitting: Internal Medicine

## 2022-05-26 ENCOUNTER — Other Ambulatory Visit: Payer: Self-pay | Admitting: Endocrinology

## 2022-05-26 ENCOUNTER — Ambulatory Visit (INDEPENDENT_AMBULATORY_CARE_PROVIDER_SITE_OTHER): Payer: 59 | Admitting: Internal Medicine

## 2022-05-26 ENCOUNTER — Other Ambulatory Visit (HOSPITAL_BASED_OUTPATIENT_CLINIC_OR_DEPARTMENT_OTHER): Payer: Self-pay

## 2022-05-26 VITALS — BP 112/78 | HR 77 | Ht 71.0 in | Wt 245.0 lb

## 2022-05-26 DIAGNOSIS — K766 Portal hypertension: Secondary | ICD-10-CM | POA: Diagnosis not present

## 2022-05-26 DIAGNOSIS — K3189 Other diseases of stomach and duodenum: Secondary | ICD-10-CM | POA: Diagnosis not present

## 2022-05-26 DIAGNOSIS — K7581 Nonalcoholic steatohepatitis (NASH): Secondary | ICD-10-CM | POA: Diagnosis not present

## 2022-05-26 DIAGNOSIS — K7460 Unspecified cirrhosis of liver: Secondary | ICD-10-CM | POA: Diagnosis not present

## 2022-05-26 DIAGNOSIS — Z86010 Personal history of colonic polyps: Secondary | ICD-10-CM

## 2022-05-26 DIAGNOSIS — I200 Unstable angina: Secondary | ICD-10-CM

## 2022-05-26 MED ORDER — FENOFIBRATE MICRONIZED 134 MG PO CAPS
134.0000 mg | ORAL_CAPSULE | ORAL | 3 refills | Status: DC
  Filled 2022-05-26: qty 15, 35d supply, fill #0
  Filled 2022-05-26: qty 12, 28d supply, fill #0
  Filled 2022-07-21: qty 15, 35d supply, fill #1
  Filled 2022-08-27: qty 15, 35d supply, fill #2
  Filled 2022-09-29: qty 15, 35d supply, fill #3

## 2022-05-26 NOTE — Progress Notes (Signed)
HISTORY OF PRESENT ILLNESS:  James [name] is a 66 y.o. male with MULTIPLE SIGNIFICANT medical problems as listed below.  He has been followed in this office for history of compensated NASH cirrhosis, GERD, diverticulitis, and prior cholecystectomy.  He also has GAVE and a history of iron deficiency anemia secondary to the same.  He also has morbid obesity, coronary artery disease with stent placement last year now on Plavix, and diabetes with complications including the need for left great toe amputation this summer.  Patient has not been seen in this office since September 2021.  He is overdue for follow-up.  This appointment was secured by his primary care provider.  In terms of his liver, patient has no particular issues to report.  No GI bleeding, issues with edema, encephalopathy, or jaundice.  Last screening abdominal ultrasound April 2023.  Cirrhosis with mild splenomegaly but no hepatic mass.  Also underwent CT scan of the abdomen and pelvis with contrast April 2023.  He was found to have sigmoid diverticulitis which was uncomplicated.  Hepatic cirrhosis with mild splenomegaly.  His last upper endoscopy was performed July 2021.  He was found to have GAVE.  No varices.  His last colonoscopy was performed at that same time.  He was found to have multiple adenomatous and advanced colon polyps for which follow-up in 3 years recommended.  He has no lower GI complaints.  He does take pantoprazole for GERD.  Good control of symptoms.  Review of blood work from May 11, 2022 shows a hemoglobin A1c of 7.1.  Review of blood work from August 2023 reveals hemoglobin 12.0, platelets 174,000, normal liver tests except for mild elevation of bilirubin at 1.5.  Prothrombin time from July 2023 was normal at 1.1.  REVIEW OF SYSTEMS:  All non-GI ROS negative unless otherwise stated in the HPI except for arthritis, back pain, fatigue, headaches, skin rash, sleeping problems, excessive urination  Past Medical  History:  Diagnosis Date   Abnormal cardiac CT angiography    Acid reflux    Annual physical exam 04/08/2015   Arthritis    Atypical chest pain 06/13/2020   Blood transfusion without reported diagnosis    Body mass index (BMI) 35.0-35.9, adult 04/05/2019   Chronic fatigue 01/28/2015   Chronic headaches    on cymbalta   Chronic migraine w/o aura, not intractable, w/o stat migr 10/24/2018   Cirrhosis ([place])    Colon polyps    Complication of anesthesia    problems waking up from anesthesia   Coronary artery disease 01/23/2019   Depression    on cymbalta   Diabetes mellitus with neuropathy ([place])    Diabetes with neuropathy 04/25/2013   Diverticulitis 03/2013   Dyslipidemia 05/29/2019   Eczema    Elevated LFTs    Epidural lipomatosis 10/05/2018   Essential (primary) hypertension 04/05/2019   Essential hypertension 10/10/2019   Fatty liver    GERD (gastroesophageal reflux disease) 04/28/2011   H/O craniotomy 05/07/2015   Headache 04/28/2011   Hepatitis 10/2017   NASH cirrhosis   History of kidney stones    Hyperlipidemia    Hypersomnia with sleep apnea 01/28/2015   Hypertension    IDA (iron deficiency anemia) 01/24/2019   Idiopathic intracranial hypertension 01/14/2017   Insomnia 04/26/2013   Kidney stone    Liver cirrhosis secondary to NASH (nonalcoholic steatohepatitis) ([place]) 01/02/2016   Low back pain 04/05/2019   Lower back injury 08/14/2019   Morbid obesity ([place])    Neuromuscular disorder ([place])  neuropathy   Neuropathy    Nonalcoholic steatohepatitis 08/30/5454   Obstructive hydrocephalus ([place]) 01/28/2015   OSA -- dx ~ 2012, cpap intolerant 09/04/2014    dx ~ 2012, cpap intolerant    PCP NOTES >>> 04/08/2015   Post-op pain 03/19/2019   Post-traumatic hydrocephalus ([place])    s/p shunts x 2 (first got infected )   Presence of cerebrospinal fluid drainage device 07/28/2011   Psoriasis    sees Dr [name]   Psoriatic arthritis ([employer])    REM behavioral disorder 01/14/2017   Scapholunate  advanced collapse of left wrist 04/2015   see's Dr.Ortmann   Severe obesity (BMI >= 40) ([place]) 01/28/2015   SI (sacroiliac) joint dysfunction 08/14/2019   Sigmoid diverticulitis 04/25/2013   Sleep apnea    no CPAP      Spondylolisthesis, lumbar region 03/16/2019   Stomach ulcer    Testosterone deficiency 04/28/2011   VP (ventriculoperitoneal) shunt status 07/31/2020    Past Surgical History:  Procedure Laterality Date   AMPUTATION Left 12/27/2021   Procedure: AMPUTATION GREAT TOE;  Surgeon: Trula Slade, DPM;  Location: [facility];  Service: Podiatry;  Laterality: Left;   BACK SURGERY  1980   BRAIN SURGERY     VP shunts placed in 2007   CHOLECYSTECTOMY N/A 08/25/2017   Procedure: LAPAROSCOPIC CHOLECYSTECTOMY WITH INTRAOPERATIVE CHOLANGIOGRAM;  Surgeon: [name], MD;  Location: [facility];  Service: General;  Laterality: N/A;   COLONOSCOPY     CORONARY STENT INTERVENTION N/A 09/27/2020   Procedure: CORONARY STENT INTERVENTION;  Surgeon: Jettie Booze, MD;  Location: [facility];  Service: Cardiovascular;  Laterality: N/A;   INTRAVASCULAR ULTRASOUND/IVUS N/A 09/27/2020   Procedure: Intravascular Ultrasound/IVUS;  Surgeon: Jettie Booze, MD;  Location: [facility];  Service: Cardiovascular;  Laterality: N/A;   JOINT REPLACEMENT     total hip   LEFT HEART CATH N/A 09/27/2020   Procedure: Left Heart Cath;  Surgeon: Jettie Booze, MD;  Location: [facility];  Service: Cardiovascular;  Laterality: N/A;   LEFT HEART CATH AND CORONARY ANGIOGRAPHY N/A 09/24/2020   Procedure: LEFT HEART CATH AND CORONARY ANGIOGRAPHY;  Surgeon: Jettie Booze, MD;  Location: [facility];  Service: Cardiovascular;  Laterality: N/A;   LEFT HEART CATH AND CORONARY ANGIOGRAPHY N/A 08/18/2021   Procedure: LEFT HEART CATH AND CORONARY ANGIOGRAPHY;  Surgeon: Troy Sine, MD;  Location: [facility];  Service: Cardiovascular;  Laterality: N/A;   LUMBAR FUSION   03/16/2019   SHOULDER SURGERY Left 2010   TEE WITHOUT CARDIOVERSION N/A 01/01/2022   Procedure: TRANSESOPHAGEAL ECHOCARDIOGRAM (TEE);  Surgeon: Berniece Salines, DO;  Location: [facility];  Service: Cardiovascular;  Laterality: N/A;   TOE SURGERY Left 2018   TONSILLECTOMY     as a child   TOTAL HIP ARTHROPLASTY Left 2011   UPPER GASTROINTESTINAL ENDOSCOPY  01/04/2020   VENTRICULOPERITONEAL SHUNT  2007   x2    Social History ISAIR INABINET  reports that he has never smoked. He has never used smokeless tobacco. He reports that he does not drink alcohol and does not use drugs.  family history includes Down syndrome in his son; Heart disease in his brother; Lung cancer in his father; Other in his brother and mother.  Allergies  Allergen Reactions   Bee Venom Anaphylaxis   Hydrocodone Bit-Homatrop Mbr Other (See Comments)    Hallucinations, confusion, delirium Depressed feeling   Toradol [Ketorolac Tromethamine] Other (See Comments)  Hallucinations, confusion, delirium   Prednisone     Patient reports it causes cirrhosis to flare up   Sulfadiazine     NDC LLVD:[id number] [place] WYBR:[id number] NDC TNBZ:[id number]   Morphine And Related Other (See Comments)    Hallucinations, back in the 80s. States has taken vicodin before w/o problems    Sulfa Drugs Cross Reactors Rash       PHYSICAL EXAMINATION: Vital signs: BP 112/78   Pulse 77   Ht _0  (1.803 m)   Wt 245 lb (111.1 kg)   BMI 34.17 kg/m   Constitutional: Overweight and somewhat unhealthy appearing, no acute distress Psychiatric: alert and oriented x3, cooperative Eyes: extraocular movements intact, anicteric, conjunctiva pink.. Mouth: oral pharynx moist, no lesions.  Missing upper teeth Neck: supple no lymphadenopathy Cardiovascular: heart regular rate and rhythm, no murmur Lungs: clear to auscultation bilaterally Abdomen: soft, obese, nontender, nondistended, no obvious ascites, no peritoneal signs, normal bowel  sounds, no organomegaly Rectal: Omitted Extremities: no clubbing, cyanosis, or lower extremity edema bilaterally Skin: no lesions on visible extremities Neuro: No focal deficits. No asterixis.  ASSESSMENT:  1.  NASH cirrhosis.  Compensated.  MELD score 10 2.  GAVE.  Associated iron deficiency anemia.  Corrected with iron replacement 3.  Upper endoscopy 2021.  No varices 4.  Colonoscopy 2021 with adenomatous polyps 5.  GERD.  Symptoms controlled with PPI 6.  Multiple significant medical problems including coronary artery disease on Plavix and diabetes mellitus with complications   PLAN:  1.  Exercise and weight loss 2.  Continue iron replacement 3.  Follow-up surveillance ultrasound of the liver to rule out [place]. 4.  Ongoing general medical care with Dr. [name] 5.  Routine GI office follow-up 6 months.  At that time he will be due for surveillance colonoscopy and upper endoscopy.  Contact the office in the interim for any questions or problems Total time of 30 minutes was spent preparing to see the patient, reviewing the myriad of data, obtaining interval comprehensive history, performing medically appropriate physical examination, counseling and educating the patient regarding the above listed issues, ordering advanced radiology study, directing follow-up, and documenting clinical information in the health record

## 2022-05-26 NOTE — Patient Instructions (Addendum)
_______________________________________________________  If you are age 64 or older, your body mass index should be between 23-30. Your Body mass index is 34.17 kg/m. If this is out of the aforementioned range listed, please consider follow up with your Primary Care Provider.  If you are age 65 or younger, your body mass index should be between 19-25. Your Body mass index is 34.17 kg/m. If this is out of the aformentioned range listed, please consider follow up with your Primary Care Provider.   ________________________________________________________  The [facility] providers would like to encourage you to use [facility] to communicate with providers for non-urgent requests or questions.  Due to long hold times on the telephone, sending your provider a message by [facility] may be a faster and more efficient way to get a response.  Please allow 48 business hours for a response.  Please remember that this is for non-urgent requests.  _______________________________________________________  James [name] have been scheduled for an abdominal ultrasound at [facility] (1st floor of hospital) on 8 hours prior to your appointment. Should you need to reschedule your appointment, please contact radiology at [phone]. This test typically takes about 30 minutes to perform.  Please follow up in 6 months

## 2022-05-28 ENCOUNTER — Other Ambulatory Visit (HOSPITAL_BASED_OUTPATIENT_CLINIC_OR_DEPARTMENT_OTHER): Payer: Self-pay

## 2022-05-28 ENCOUNTER — Other Ambulatory Visit: Payer: Self-pay | Admitting: Internal Medicine

## 2022-05-29 ENCOUNTER — Other Ambulatory Visit (HOSPITAL_BASED_OUTPATIENT_CLINIC_OR_DEPARTMENT_OTHER): Payer: Self-pay

## 2022-05-29 ENCOUNTER — Ambulatory Visit (HOSPITAL_COMMUNITY)
Admission: RE | Admit: 2022-05-29 | Discharge: 2022-05-29 | Disposition: A | Discharge status: Home / Self Care | Payer: 59 | Source: Ambulatory Visit | Attending: Internal Medicine | Admitting: Internal Medicine

## 2022-05-29 DIAGNOSIS — K7460 Unspecified cirrhosis of liver: Secondary | ICD-10-CM | POA: Diagnosis not present

## 2022-05-29 DIAGNOSIS — K7581 Nonalcoholic steatohepatitis (NASH): Secondary | ICD-10-CM | POA: Diagnosis not present

## 2022-05-29 DIAGNOSIS — Z9049 Acquired absence of other specified parts of digestive tract: Secondary | ICD-10-CM | POA: Diagnosis not present

## 2022-06-01 ENCOUNTER — Other Ambulatory Visit (HOSPITAL_COMMUNITY): Payer: Self-pay

## 2022-06-02 ENCOUNTER — Other Ambulatory Visit (HOSPITAL_COMMUNITY): Payer: Self-pay

## 2022-06-02 MED ORDER — HUMIRA PEN 40 MG/0.8ML ~~LOC~~ PNKT: PEN_INJECTOR | SUBCUTANEOUS | 0 refills | Status: DC

## 2022-06-04 ENCOUNTER — Other Ambulatory Visit (HOSPITAL_COMMUNITY): Payer: Self-pay

## 2022-06-04 ENCOUNTER — Other Ambulatory Visit (HOSPITAL_BASED_OUTPATIENT_CLINIC_OR_DEPARTMENT_OTHER): Payer: Self-pay

## 2022-06-05 ENCOUNTER — Ambulatory Visit: Payer: 59 | Attending: Internal Medicine | Admitting: Pharmacist

## 2022-06-05 DIAGNOSIS — Z79899 Other long term (current) drug therapy: Secondary | ICD-10-CM

## 2022-06-05 NOTE — Progress Notes (Signed)
S: Patient has a follow up today for his specialty medication, Humira.   Patient is currently taking Humira for psoriatic arthritis. Patient is managed by Dr. Renda Rolls for this.   Adherence: denies any missed doses   Efficacy: reports that it is still working well for him.  Dosing: SubQ: 40 mg every other week  Drug-drug interactions:none  Screening: TB test: negative per patient  Hepatitis: completed prior to drug initiation   Monitoring: S/sx of infection: none currently CBC: last CBC (see below) S/sx of hypersensitivity: none S/sx of malignancy: none S/sx of heart failure: no diagnosis of HF    O:     Lab Results  Component Value Date   WBC 4.1 02/09/2022   HGB 12.0 (L) 02/09/2022   HCT 36.6 (L) 02/09/2022   MCV 88 02/09/2022   PLT 174 02/09/2022      Chemistry      Component Value Date/Time   NA 137 05/11/2022 1032   NA 139 09/16/2020 1724   K 4.5 05/11/2022 1032   CL 103 05/11/2022 1032   CO2 23 05/11/2022 1032   BUN 32 (H) 05/11/2022 1032   BUN 21 09/16/2020 1724   CREATININE 1.64 (H) 05/11/2022 1032   GLU 189 02/24/2019 0000      Component Value Date/Time   CALCIUM 9.8 05/11/2022 1032   ALKPHOS 89 01/30/2022 1007   AST 26 01/30/2022 1007   ALT 25 01/30/2022 1007   BILITOT 1.5 (H) 01/30/2022 1007       A/P: 1. Medication review: Patient on Humira for psoriatic arthritis and is tolerating it well with no adverse effects and reported control of psoriatic arthritis. Patient has been stable on medication for a while now. Patient denies any questions/concerns. No recommendations for any changes.   Benard Halsted, PharmD, [place], [place] [phone]

## 2022-06-08 ENCOUNTER — Other Ambulatory Visit (HOSPITAL_COMMUNITY): Payer: Self-pay

## 2022-06-08 ENCOUNTER — Other Ambulatory Visit: Payer: Self-pay | Admitting: Pharmacist

## 2022-06-08 MED ORDER — HUMIRA PEN 40 MG/0.8ML ~~LOC~~ PNKT
PEN_INJECTOR | SUBCUTANEOUS | 0 refills | Status: DC
  Filled 2022-06-08: qty 2, 28d supply, fill #0

## 2022-06-18 ENCOUNTER — Other Ambulatory Visit (HOSPITAL_BASED_OUTPATIENT_CLINIC_OR_DEPARTMENT_OTHER): Payer: Self-pay

## 2022-06-18 ENCOUNTER — Encounter: Payer: Self-pay | Admitting: Endocrinology

## 2022-06-18 ENCOUNTER — Ambulatory Visit (INDEPENDENT_AMBULATORY_CARE_PROVIDER_SITE_OTHER): Payer: 59 | Admitting: Endocrinology

## 2022-06-18 VITALS — BP 138/80 | HR 73 | Ht 71.0 in | Wt 252.6 lb

## 2022-06-18 DIAGNOSIS — E1165 Type 2 diabetes mellitus with hyperglycemia: Secondary | ICD-10-CM

## 2022-06-18 DIAGNOSIS — N289 Disorder of kidney and ureter, unspecified: Secondary | ICD-10-CM | POA: Diagnosis not present

## 2022-06-18 DIAGNOSIS — I200 Unstable angina: Secondary | ICD-10-CM

## 2022-06-18 MED ORDER — VICTOZA 18 MG/3ML ~~LOC~~ SOPN
PEN_INJECTOR | SUBCUTANEOUS | 2 refills | Status: DC
  Filled 2022-06-18: qty 6, 28d supply, fill #0
  Filled 2022-07-21: qty 6, 28d supply, fill #1

## 2022-06-18 MED ORDER — FREESTYLE LIBRE 3 SENSOR MISC
3 refills | Status: DC
  Filled 2022-06-18: qty 6, 84d supply, fill #0
  Filled 2022-08-27: qty 6, 84d supply, fill #1

## 2022-06-18 NOTE — Patient Instructions (Signed)
No Metformin in am  Start VICTOZA injection as shown once daily at the same time of the day.  Dial the dose to 0.6 mg on the pen for the first week.  You may inject in the stomach, thigh or arm. You may experience nausea in the first few days which usually goes away.  You will feel fullness of the stomach with starting the medication and should try to keep the portions at meals small. After 1 week increase the dose to 1.45m daily if no nausea present.   If any questions or concerns are present call the office or the [facility] at [phone] Visit [email or web address] more useful information  No Cokes or juice  Check blood sugars on waking up 7 days a week  Also check blood sugars about 2 hours after meals and do this after different meals by rotation  Recommended blood sugar levels on waking up are 90-130 and about 2 hours after meal is 130-160  Please bring your blood sugar monitor to each visit, thank you

## 2022-06-18 NOTE — Progress Notes (Signed)
Patient ID: James [name], male   DOB: 09-29-1955, 66 y.o.   MRN: [id number]           Reason for Appointment: Follow-up for Type 2 Diabetes   History of Present Illness:          Date of diagnosis of type 2 diabetes mellitus:  ?  2014      Background history:  He is not clear how his diabetes was diagnosed, likely on routine testing. Initially had been treated with metformin and also tried on Amaryl Patient had progressive increase in his blood sugars since 1/17 with stopping metformin and being on the regimen of Amaryl and Januvia, he thinks his blood sugars went up to 601.  He was then started on basal bolus insulin   Lowest A1c was 6.8 previously  OMNIPOD insulin pump settings as follows Basal rates: 12 AM-6 AM = 0.1, 6 AM-11 AM = 0.5, 11 AM- 9:30 PM = 1.0 and 9:30 PM-12 AM = 1.2 Correction 1: 50 carbohydrate coverage 1: 1   Recent history:    Non-insulin hypoglycemic drugs the patient is taking are:  metformin 1000 mg a.m., 500 mg p.m. daily, Amaryl 4 mg daily  His A1c is the same at 7.1, previously was at 5.9   Current management, blood sugar patterns and problems identified:  As before he forgets to take his Ozempic and he says that even the smallest dose causes nausea and he does not take it He did not let us know what year he has stopped it As before he appears to be having higher readings partly from drinking regular soft drinks at different times of the day and sometimes with juice also  Does not always restrict carbohydrates especially at dinnertime Is not able to lose any weight Also not doing any exercise partly because of joint pains and gait imbalance Weight has gone up He does not like diet drinks As before blood sugars are mostly higher after dinner but usually come down significantly by morning  INTERPRETATION of the freestyle libre sensor download for the last 2 weeks as follows  His blood sugars have not been monitored consistently with  incomplete data on most days  Also overall blood sugars appear to be relatively higher in the first week compared to the second week of download information HIGHEST blood sugars appear to be late in the evening especially after midnight Although hypoglycemia not present blood sugars are generally much lower early morning Postprandial readings are rising significantly between early morning and mid afternoon but generally flat after lunch Blood sugars appear to be rising after 6 PM but data is somewhat incomplete, blood sugars are generally bring up a higher after midnight and then progressively declining without hypoglycemia   CGM use % of time 54  2-week average/GV 147/30  Time in range      79%  % Time Above 180 19  % Time above 250 2  % Time Below 70      PRE-MEAL Fasting Lunch Dinner Bedtime Overall  Glucose range:       Averages: 103   191    POST-MEAL PC Breakfast PC Lunch PC Dinner  Glucose range:     Averages:  162 171   Previous data:  CGM use % of time 74  2-week average/GV 141/38  Time in range    74    %  % Time Above 180 21+4  % Time above 250   % Time  Below 70 9     PRE-MEAL Fasting Lunch Dinner Bedtime Overall  Glucose range:       Averages:     141   POST-MEAL PC Breakfast PC Lunch PC Dinner  Glucose range:     Averages: 143 178 187   Previously:  Blood sugar AVERAGES from his libre on that to hourly basis showing LOWEST average of 106 early morning and highest 193 late at night Overall AVERAGE 135   Side  effects from medications have been: None  Compliance with the medical regimen: Fair  Glucose monitoring:   Glucometer:  Freestyle libre 14-day   Self-care:   Meal times are:  Breakfast variable, lunch: 12-3 PM Dinner: 6 PM    Dietician visit, most recent:09/2015               CDE consultation: 12/2018  Weight history:  Wt Readings from Last 3 Encounters:  06/18/22 252 lb 9.6 oz (114.6 kg)  05/26/22 245 lb (111.1 kg)  05/19/22 241 lb  (109.3 kg)    Glycemic control:   Lab Results  Component Value Date   HGBA1C 7.1 (H) 05/11/2022   HGBA1C 7.1 (H) 01/30/2022   HGBA1C 5.9 10/30/2021   Lab Results  Component Value Date   MICROALBUR 2.1 (H) 01/30/2022   LDLCALC 68 01/30/2022   CREATININE 1.64 (H) 05/11/2022    No visits with results within 1 Week(s) from this visit.  Latest known visit with results is:  Lab on 05/11/2022  Component Date Value Ref Range Status   Sodium 05/11/2022 137  135 - 145 mEq/L Final   Potassium 05/11/2022 4.5  3.5 - 5.1 mEq/L Final   Chloride 05/11/2022 103  96 - 112 mEq/L Final   CO2 05/11/2022 23  19 - 32 mEq/L Final   Glucose, Bld 05/11/2022 138 (H)  70 - 99 mg/dL Final   BUN 05/11/2022 32 (H)  6 - 23 mg/dL Final   Creatinine, Ser 05/11/2022 1.64 (H)  0.40 - 1.50 mg/dL Final   GFR 05/11/2022 44.03 (L)  >60.00 mL/min Final   Calculated using the CKD-EPI Creatinine Equation (2021)   Calcium 05/11/2022 9.8  8.4 - 10.5 mg/dL Final   Hgb A1c MFr Bld 05/11/2022 7.1 (H)  4.6 - 6.5 % Final   Glycemic Control Guidelines for People with Diabetes:Non Diabetic:  <6%Goal of Therapy: <7%Additional Action Suggested:  >8%        Allergies as of 06/18/2022       Reactions   Bee Venom Anaphylaxis   Hydrocodone Bit-homatrop Mbr Other (See Comments)   Hallucinations, confusion, delirium Depressed feeling   Toradol [ketorolac Tromethamine] Other (See Comments)   Hallucinations, confusion, delirium   Prednisone    Patient reports it causes cirrhosis to flare up   Sulfadiazine    NDC IFOY:[id number] NDC HMCN:[id number] NDC HUTM:[id number]   Morphine And Related Other (See Comments)   Hallucinations, back in the 80s. States has taken vicodin before w/o problems    Sulfa Drugs Cross Reactors Rash        Medication List        Accurate as of June 18, 2022 11:59 PM. If you have any questions, ask your nurse or doctor.          STOP taking these medications    Ozempic (2  MG/DOSE) 8 MG/3ML Sopn Generic drug: Semaglutide (2 MG/DOSE) Stopped by: Elayne Snare, MD       TAKE these medications    acetaminophen 325 MG tablet  Commonly known as: TYLENOL Take 2 tablets (650 mg total) by mouth every 4 (four) hours as needed for headache or mild pain.   amLODipine 5 MG tablet Commonly known as: NORVASC Take 1 tablet (5 mg total) by mouth daily.   aspirin 81 MG chewable tablet Chew 1 tablet (81 mg total) by mouth daily.   atorvastatin 80 MG tablet Commonly known as: LIPITOR Take 1 tablet (80 mg total) by mouth at bedtime.   Botox 200 units injection Generic drug: botulinum toxin Type A Provider to inject 155 units into the muscles of the head and neck every 3 months. Discard remainder.   carvedilol 12.5 MG tablet Commonly known as: COREG Take 1 tablet (12.5 mg total) by mouth 2 (two) times daily with a meal.   clonazePAM 0.5 MG tablet Commonly known as: KLONOPIN TAKE 1 TABLET BY MOUTH EVERY NIGHT AT BEDTIME   clopidogrel 75 MG tablet Commonly known as: PLAVIX TAKE 1 TABLET (75 MG TOTAL) BY MOUTH DAILY.   Comirnaty syringe Generic drug: COVID-19 mRNA vaccine 2023-2024 Inject into the muscle.   DULoxetine 60 MG capsule Commonly known as: CYMBALTA Take 2 capsules (120 mg total) by mouth daily.   EPINEPHrine 0.3 mg/0.3 mL Soaj injection Commonly known as: EpiPen 2-Pak Inject 0.3 mLs (0.3 mg total) into the muscle once as needed for up to 1 dose for anaphylaxis.   fenofibrate micronized 134 MG capsule Commonly known as: LOFIBRA Take 1 capsule (134 mg total) by mouth 3 (three) times a week.   FreeStyle Libre 14 Day Sensor Misc Apply 1 sensor every 14 (fourteen) days. What changed: Another medication with the same name was added. Make sure you understand how and when to take each. Changed by: Elayne Snare, MD   FreeStyle Libre 3 Sensor Misc Place 1 sensor on the skin every 14 days. Use to check glucose continuously What changed: You were  already taking a medication with the same name, and this prescription was added. Make sure you understand how and when to take each. Changed by: Elayne Snare, MD   gabapentin 600 MG tablet Commonly known as: NEURONTIN Take 1 tablet (600 mg total) by mouth 2 (two) times daily.   glimepiride 4 MG tablet Commonly known as: AMARYL TAKE 1 TABLET BY MOUTH ONCE DAILY BEFORE BREAKFAST   Humira Pen 40 MG/0.8ML Pnkt Generic drug: Adalimumab INJECT 40 MG (0.8 ML) UNDER THE SKIN EVERY OTHER WEEK.   isosorbide mononitrate 60 MG 24 hr tablet Commonly known as: IMDUR Take 1 tablet (60 mg total) by mouth daily.   metFORMIN 500 MG tablet Commonly known as: GLUCOPHAGE Take 2 tablets (1,000 mg total) by mouth 2 (two) times daily.   nitroGLYCERIN 0.4 MG SL tablet Commonly known as: NITROSTAT Place 1 tablet (0.4 mg total) under the tongue every 5 (five) minutes for 3 doses as needed for chest pain.   pantoprazole 40 MG tablet Commonly known as: PROTONIX Take 1 tablet (40 mg total) by mouth daily.   Victoza 18 MG/3ML Sopn Generic drug: liraglutide Start with 0.47m subcutaneously once a day for 7 days, then increase to 1.212monce a day Started by: AjElayne SnareMD        Allergies:  Allergies  Allergen Reactions   Bee Venom Anaphylaxis   Hydrocodone Bit-Homatrop Mbr Other (See Comments)    Hallucinations, confusion, delirium Depressed feeling   Toradol [Ketorolac Tromethamine] Other (See Comments)    Hallucinations, confusion, delirium   Prednisone     Patient reports it  causes cirrhosis to flare up   Sulfadiazine     NDC JOIN:[id number] NDC JGGE:[id number] NDC TKPT:[id number]   Morphine And Related Other (See Comments)    Hallucinations, back in the 80s. States has taken vicodin before w/o problems    Sulfa Drugs Cross Reactors Rash    Past Medical History:  Diagnosis Date   Abnormal cardiac CT angiography    Acid reflux    Annual physical exam 04/08/2015   Arthritis     Atypical chest pain 06/13/2020   Blood transfusion without reported diagnosis    Body mass index (BMI) 35.0-35.9, adult 04/05/2019   Chronic fatigue 01/28/2015   Chronic headaches    on cymbalta   Chronic migraine w/o aura, not intractable, w/o stat migr 10/24/2018   Cirrhosis ([place])    Colon polyps    Complication of anesthesia    problems waking up from anesthesia   Coronary artery disease 01/23/2019   Depression    on cymbalta   Diabetes mellitus with neuropathy ([place])    Diabetes with neuropathy 04/25/2013   Diverticulitis 03/2013   Dyslipidemia 05/29/2019   Eczema    Elevated LFTs    Epidural lipomatosis 10/05/2018   Essential (primary) hypertension 04/05/2019   Essential hypertension 10/10/2019   Fatty liver    GERD (gastroesophageal reflux disease) 04/28/2011   H/O craniotomy 05/07/2015   Headache 04/28/2011   Hepatitis 10/2017   NASH cirrhosis   History of kidney stones    Hyperlipidemia    Hypersomnia with sleep apnea 01/28/2015   Hypertension    IDA (iron deficiency anemia) 01/24/2019   Idiopathic intracranial hypertension 01/14/2017   Insomnia 04/26/2013   Kidney stone    Liver cirrhosis secondary to NASH (nonalcoholic steatohepatitis) ([place]) 01/02/2016   Low back pain 04/05/2019   Lower back injury 08/14/2019   Morbid obesity ([place])    Neuromuscular disorder ([place])    neuropathy   Neuropathy    Nonalcoholic steatohepatitis 0/0/1749   Obstructive hydrocephalus ([place]) 01/28/2015   OSA -- dx ~ 2012, cpap intolerant 09/04/2014    dx ~ 2012, cpap intolerant    PCP NOTES >>> 04/08/2015   Post-op pain 03/19/2019   Post-traumatic hydrocephalus ([place])    s/p shunts x 2 (first got infected )   Presence of cerebrospinal fluid drainage device 07/28/2011   Psoriasis    sees Dr [name]   Psoriatic arthritis ([place])    REM behavioral disorder 01/14/2017   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   see's Dr.Ortmann   Severe obesity (BMI >= 40) ([place]) 01/28/2015   SI (sacroiliac) joint  dysfunction 08/14/2019   Sigmoid diverticulitis 04/25/2013   Sleep apnea    no CPAP      Spondylolisthesis, lumbar region 03/16/2019   Stomach ulcer    Testosterone deficiency 04/28/2011   VP (ventriculoperitoneal) shunt status 07/31/2020    Past Surgical History:  Procedure Laterality Date   AMPUTATION Left 12/27/2021   Procedure: AMPUTATION GREAT TOE;  Surgeon: Trula Slade, DPM;  Location: [facility];  Service: Podiatry;  Laterality: Left;   BACK SURGERY  1980   BRAIN SURGERY     VP shunts placed in 2007   CHOLECYSTECTOMY N/A 08/25/2017   Procedure: LAPAROSCOPIC CHOLECYSTECTOMY WITH INTRAOPERATIVE CHOLANGIOGRAM;  Surgeon: [name], MD;  Location: [facility];  Service: General;  Laterality: N/A;   COLONOSCOPY     CORONARY STENT INTERVENTION N/A 09/27/2020   Procedure: CORONARY STENT INTERVENTION;  Surgeon: Jettie Booze, MD;  Location: [facility];  Service: Cardiovascular;  Laterality: N/A;   INTRAVASCULAR ULTRASOUND/IVUS N/A 09/27/2020   Procedure: Intravascular Ultrasound/IVUS;  Surgeon: Jettie Booze, MD;  Location: [facility];  Service: Cardiovascular;  Laterality: N/A;   JOINT REPLACEMENT     total hip   LEFT HEART CATH N/A 09/27/2020   Procedure: Left Heart Cath;  Surgeon: Jettie Booze, MD;  Location: [facility];  Service: Cardiovascular;  Laterality: N/A;   LEFT HEART CATH AND CORONARY ANGIOGRAPHY N/A 09/24/2020   Procedure: LEFT HEART CATH AND CORONARY ANGIOGRAPHY;  Surgeon: Jettie Booze, MD;  Location: [facility];  Service: Cardiovascular;  Laterality: N/A;   LEFT HEART CATH AND CORONARY ANGIOGRAPHY N/A 08/18/2021   Procedure: LEFT HEART CATH AND CORONARY ANGIOGRAPHY;  Surgeon: Troy Sine, MD;  Location: [facility];  Service: Cardiovascular;  Laterality: N/A;   LUMBAR FUSION  03/16/2019   SHOULDER SURGERY Left 2010   TEE WITHOUT CARDIOVERSION N/A 01/01/2022   Procedure: TRANSESOPHAGEAL ECHOCARDIOGRAM (TEE);   Surgeon: Berniece Salines, DO;  [facility];  Service: Cardiovascular;  Laterality: N/A;   TOE SURGERY Left 2018   TONSILLECTOMY     as a child   TOTAL HIP ARTHROPLASTY Left 2011   UPPER GASTROINTESTINAL ENDOSCOPY  01/04/2020   VENTRICULOPERITONEAL SHUNT  2007   x2    Family History  Problem Relation Age of Onset   Other Mother    Lung cancer Father        alive, former smoker    Heart disease Brother        MI age 29   Other Brother        Murdered   Down syndrome Son    Diabetes Neg Hx    Prostate cancer Neg Hx    Colon cancer Neg Hx    Stomach cancer Neg Hx    Pancreatic cancer Neg Hx    Liver disease Neg Hx     Social History:  reports that he has never smoked. He has never used smokeless tobacco. He reports that he does not drink alcohol and does not use drugs.    Review of Systems      HYPERTENSION: On carvedilol with good control Hypertension managed by his PCP   BP Readings from Last 3 Encounters:  06/18/22 138/80  05/26/22 112/78  05/19/22 136/87   RENAL dysfunction: Has had mildly increased creatinine levels, relatively stable No history of microalbuminuria  Lab Results  Component Value Date   CREATININE 1.64 (H) 05/11/2022   CREATININE 1.27 01/30/2022   CREATININE 1.51 (H) 01/01/2022    Lipid history: He is on Lipitor  20 mg  Is on fenofibrate 3 times a week for high triglycerides,   LDL is below 70 and triglycerides are normal    Lab Results  Component Value Date   CHOL 137 01/30/2022   CHOL 118 08/16/2021   CHOL 139 03/04/2021   Lab Results  Component Value Date   HDL 42.10 01/30/2022   HDL 43 08/16/2021   HDL 39.90 03/04/2021   Lab Results  Component Value Date   LDLCALC 68 01/30/2022   LDLCALC 55 08/16/2021   LDLCALC 69 03/04/2021   Lab Results  Component Value Date   TRIG 137.0 01/30/2022   TRIG 101 08/16/2021   TRIG 151.0 (H) 03/04/2021   Lab Results  Component Value Date   CHOLHDL 3 01/30/2022   CHOLHDL  2.7 08/16/2021   CHOLHDL 3 03/04/2021  Lab Results  Component Value Date   LDLDIRECT 67.0 12/31/2020   LDLDIRECT 91.0 06/11/2020   LDLDIRECT 83.0 10/09/2019            He is being followed by gastroenterology for liver cirrhosis secondary to [facility]  Lab Results  Component Value Date   ALT 25 01/30/2022     He has symptomatic painful neuropathy and is on gabapentin 600 mg twice a day and  Cymbalta 60 mg  Has been followed by gastroenterologist for NASH  Lab Results  Component Value Date   ALT 25 01/30/2022     Review of Systems    Physical Examination:  BP 138/80   Pulse 73   Ht 5' 11"  (1.803 m)   Wt 252 lb 9.6 oz (114.6 kg)   SpO2 95%   BMI 35.23 kg/m       ASSESSMENT:  Diabetes type 2 with obesity  See history of present illness for discussion of current diabetes management, blood sugar patterns and problems identified  Currently on a regimen of AMARYL and Metformin 1000 mg a.m. and 500 mg p.m. daily  A1c is relatively higher at 7.1 again  His blood sugar patterns are difficult to assess because his active CGM time is only 54% Most of his high readings are related to increased carbohydrate intake, regular soft drinks especially at dinnertime He claims he has nausea from [place] now and does not remember to take it also Has gained weight also  RENAL dysfunction: Etiology unclear but his creatinine has gone up even without drop in blood pressure or any nephrotoxic drugs    PLAN: Discussed with him the option of taking Victoza daily since this can be started at a low dose and titrated very gradually as needed to help tolerability This would be much better than going on insulin again He will start with 0.6 mg for a week and then if no nausea going up to 1.2 He will do this daily in the evening Optionally if he has nausea with 1.2 mg he can go to 0.6 mg +5 clicks He needs to let us know if he has any difficulty using this Continue improving diet and  eliminating regular soft drinks  He will download the app for the [place] 3 and start using this for better coverage of his blood sugar patterns Showed him how to use the app for continuously displaying his blood sugars and discussed blood sugar targets at various times    Stop METFORMIN in the morning until renal function is improved  More regular follow-up    Patient Instructions  No Metformin in am  Start VICTOZA injection as shown once daily at the same time of the day.  Dial the dose to 0.6 mg on the pen for the first week.  You may inject in the stomach, thigh or arm. You may experience nausea in the first few days which usually goes away.  You will feel fullness of the stomach with starting the medication and should try to keep the portions at meals small. After 1 week increase the dose to 1.82m daily if no nausea present.   If any questions or concerns are present call the office or the [place] [place] [place] at [phone] Visit [email or web address] more useful information  No Cokes or juice  Check blood sugars on waking up 7 days a week  Also check blood sugars about 2 hours after meals and do this after different meals by rotation  Recommended blood sugar levels  on waking up are 90-130 and about 2 hours after meal is 130-160  Please bring your blood sugar monitor to each visit, thank you       Elayne Snare 06/19/2022, 3:01 PM

## 2022-06-25 ENCOUNTER — Encounter: Payer: Self-pay | Admitting: Endocrinology

## 2022-06-25 ENCOUNTER — Other Ambulatory Visit: Payer: Self-pay | Admitting: Endocrinology

## 2022-06-26 ENCOUNTER — Other Ambulatory Visit: Payer: Self-pay

## 2022-06-26 ENCOUNTER — Other Ambulatory Visit (HOSPITAL_BASED_OUTPATIENT_CLINIC_OR_DEPARTMENT_OTHER): Payer: Self-pay

## 2022-06-26 MED ORDER — GLIMEPIRIDE 4 MG PO TABS
4.0000 mg | ORAL_TABLET | Freq: Every day | ORAL | 3 refills | Status: DC
  Filled 2022-06-26: qty 30, 30d supply, fill #0
  Filled 2022-07-30 (×2): qty 30, 30d supply, fill #1
  Filled 2022-08-27: qty 30, 30d supply, fill #2
  Filled 2022-09-29: qty 30, 30d supply, fill #3

## 2022-06-30 ENCOUNTER — Other Ambulatory Visit (HOSPITAL_COMMUNITY): Payer: Self-pay

## 2022-07-01 ENCOUNTER — Other Ambulatory Visit: Payer: Self-pay | Admitting: Neurology

## 2022-07-01 ENCOUNTER — Other Ambulatory Visit: Payer: Self-pay

## 2022-07-01 ENCOUNTER — Other Ambulatory Visit: Payer: Self-pay | Admitting: Cardiology

## 2022-07-01 ENCOUNTER — Other Ambulatory Visit (HOSPITAL_BASED_OUTPATIENT_CLINIC_OR_DEPARTMENT_OTHER): Payer: Self-pay

## 2022-07-01 MED ORDER — CLONAZEPAM 0.5 MG PO TABS
0.5000 mg | ORAL_TABLET | Freq: Every day | ORAL | 5 refills | Status: DC
  Filled 2022-07-01 – 2022-07-02 (×2): qty 30, 30d supply, fill #0
  Filled 2022-07-30 (×2): qty 30, 30d supply, fill #1
  Filled 2022-08-27: qty 30, 30d supply, fill #2
  Filled 2022-09-29: qty 30, 30d supply, fill #3
  Filled ????-??-??: fill #0

## 2022-07-01 MED ORDER — ISOSORBIDE MONONITRATE ER 60 MG PO TB24
60.0000 mg | ORAL_TABLET | Freq: Every day | ORAL | 1 refills | Status: DC
  Filled 2022-07-01: qty 90, 90d supply, fill #0

## 2022-07-01 NOTE — Telephone Encounter (Signed)
Rx refill sent to pharmacy. 

## 2022-07-02 ENCOUNTER — Other Ambulatory Visit (HOSPITAL_BASED_OUTPATIENT_CLINIC_OR_DEPARTMENT_OTHER): Payer: Self-pay

## 2022-07-03 ENCOUNTER — Other Ambulatory Visit (HOSPITAL_COMMUNITY): Payer: Self-pay

## 2022-07-06 ENCOUNTER — Other Ambulatory Visit: Payer: Self-pay | Admitting: Pharmacist

## 2022-07-06 ENCOUNTER — Other Ambulatory Visit (HOSPITAL_BASED_OUTPATIENT_CLINIC_OR_DEPARTMENT_OTHER): Payer: Self-pay

## 2022-07-06 ENCOUNTER — Other Ambulatory Visit (HOSPITAL_COMMUNITY): Payer: Self-pay

## 2022-07-06 DIAGNOSIS — L409 Psoriasis, unspecified: Secondary | ICD-10-CM | POA: Diagnosis not present

## 2022-07-06 DIAGNOSIS — Z79899 Other long term (current) drug therapy: Secondary | ICD-10-CM | POA: Diagnosis not present

## 2022-07-06 LAB — BASIC METABOLIC PANEL
BUN: 21 (ref 4–21)
CO2: 14 (ref 13–22)
Chloride: 114 — AB (ref 99–108)
Creatinine: 1.6 — AB (ref 0.6–1.3)
Glucose: 146
Potassium: 5 mEq/L (ref 3.5–5.1)
Sodium: 143 (ref 137–147)

## 2022-07-06 LAB — HEPATIC FUNCTION PANEL
ALT: 19 U/L (ref 10–40)
AST: 23 (ref 14–40)
Alkaline Phosphatase: 78 (ref 25–125)
Bilirubin, Total: 0.7

## 2022-07-06 LAB — COMPREHENSIVE METABOLIC PANEL
Albumin: 4.4 (ref 3.5–5.0)
Calcium: 9.3 (ref 8.7–10.7)
Globulin: 3.4

## 2022-07-06 MED ORDER — HUMIRA PEN 40 MG/0.8ML ~~LOC~~ PNKT: PEN_INJECTOR | SUBCUTANEOUS | 5 refills | Status: DC

## 2022-07-06 MED ORDER — HUMIRA PEN 40 MG/0.8ML ~~LOC~~ PNKT
PEN_INJECTOR | SUBCUTANEOUS | 5 refills | Status: DC
  Filled 2022-07-06: qty 2, 28d supply, fill #0

## 2022-07-07 ENCOUNTER — Other Ambulatory Visit (HOSPITAL_COMMUNITY): Payer: Self-pay

## 2022-07-07 ENCOUNTER — Other Ambulatory Visit (HOSPITAL_BASED_OUTPATIENT_CLINIC_OR_DEPARTMENT_OTHER): Payer: Self-pay

## 2022-07-13 ENCOUNTER — Other Ambulatory Visit (HOSPITAL_BASED_OUTPATIENT_CLINIC_OR_DEPARTMENT_OTHER): Payer: Self-pay

## 2022-07-14 ENCOUNTER — Encounter: Payer: Self-pay | Admitting: Cardiology

## 2022-07-14 ENCOUNTER — Other Ambulatory Visit (HOSPITAL_COMMUNITY): Payer: Self-pay

## 2022-07-14 ENCOUNTER — Other Ambulatory Visit (HOSPITAL_BASED_OUTPATIENT_CLINIC_OR_DEPARTMENT_OTHER): Payer: Self-pay

## 2022-07-14 ENCOUNTER — Ambulatory Visit: Payer: 59 | Attending: Cardiology | Admitting: Cardiology

## 2022-07-14 VITALS — BP 152/70 | HR 71 | Ht 71.0 in | Wt 256.0 lb

## 2022-07-14 DIAGNOSIS — I10 Essential (primary) hypertension: Secondary | ICD-10-CM | POA: Diagnosis not present

## 2022-07-14 DIAGNOSIS — I25118 Atherosclerotic heart disease of native coronary artery with other forms of angina pectoris: Secondary | ICD-10-CM | POA: Diagnosis not present

## 2022-07-14 DIAGNOSIS — N182 Chronic kidney disease, stage 2 (mild): Secondary | ICD-10-CM

## 2022-07-14 DIAGNOSIS — E1122 Type 2 diabetes mellitus with diabetic chronic kidney disease: Secondary | ICD-10-CM

## 2022-07-14 MED ORDER — HUMIRA (2 PEN) 40 MG/0.8ML ~~LOC~~ AJKT
AUTO-INJECTOR | SUBCUTANEOUS | 4 refills | Status: DC
  Filled 2022-07-30: qty 2, 28d supply, fill #0
  Filled 2022-08-25: qty 2, 28d supply, fill #1
  Filled 2022-09-22: qty 2, 28d supply, fill #2
  Filled 2022-10-29: qty 2, 28d supply, fill #3
  Filled 2022-12-01: qty 2, 28d supply, fill #4

## 2022-07-14 MED ORDER — ISOSORBIDE MONONITRATE ER 120 MG PO TB24
120.0000 mg | ORAL_TABLET | Freq: Every day | ORAL | 3 refills | Status: DC
  Filled 2022-07-14: qty 90, 90d supply, fill #0
  Filled 2022-11-30: qty 90, 90d supply, fill #1
  Filled 2023-02-25: qty 90, 90d supply, fill #2
  Filled 2023-05-30: qty 40, 40d supply, fill #3
  Filled 2023-05-31: qty 50, 50d supply, fill #3

## 2022-07-14 NOTE — Patient Instructions (Signed)
Medication Instructions:   Your physician has recommended you make the following change in your medication: INCREASE Imdur to '120mg'$  daily- you may double your current dose and your next refill will reflect your new dose   INCREASE Imdur to '120mg'$  daily- you may double your current dose and your next refill will reflect your new dose   Lab Work: None Ordered If you have labs (blood work) drawn today and your tests are completely normal, you will receive your results only by: MyChart Message (if you have MyChart) OR A paper copy in the mail If you have any lab test that is abnormal or we need to change your treatment, we will call you to review the results.   Testing/Procedures: None Ordered   Follow-Up: At [facility], you and your health needs are our priority.  As part of our continuing mission to provide you with exceptional heart care, we have created designated Provider Care Teams.  These Care Teams include your primary Cardiologist (physician) and Advanced Practice Providers (APPs -  Physician Assistants and Nurse Practitioners) who all work together to provide you with the care you need, when you need it.  We recommend signing up for the patient portal called "MyChart".  Sign up information is provided on this After Visit Summary.  MyChart is used to connect with patients for Virtual Visits (Telemedicine).  Patients are able to view lab/test results, encounter notes, upcoming appointments, etc.  Non-urgent messages can be sent to your provider as well.   To learn more about what you can do with MyChart, go to [email or web address].    Your next appointment:   3 month(s)  The format for your next appointment:   In Person  Provider:   Jenne Campus, MD    Other Instructions NA

## 2022-07-14 NOTE — Progress Notes (Signed)
Cardiology Office Note:    Date:  07/14/2022   ID:  James [name], DOB 1956-05-31, MRN [id number]  PCP:  Colon Branch, MD  Cardiologist:  Jenne Campus, MD    Referring MD: Colon Branch, MD   Chief Complaint  Patient presents with   Follow-up    History of Present Illness:    James [name] is a 67 y.o. male    with past medical history significant for coronary artery disease status post PTCA of the LAD in 2022, diabetes, essential hypertension, dyslipidemia, nonalcoholic cirrhosis of the liver.  He was admitted recently to the hospital on 17 February because of intermittent chest pain.  Cardiac catheterization has been performed cardiac catheterization showed unchanged lesion compared to prior cardiac catheterization he does have about 75% lesion on the circumflex artery.  He also got multiple residual 30 to 50% lesions.  He comes today to my office for follow-up.  Overall he said he is doing quite well but described for last few months having some chest pain interestingly the chest pain happen typically at night when he tried to lay down in the bed.  At the same time he can walk climb stairs with no major difficulties.  He tells me he can go to do shopping with no problems  Past Medical History:  Diagnosis Date   Abnormal cardiac CT angiography    Acid reflux    Annual physical exam 04/08/2015   Arthritis    Atypical chest pain 06/13/2020   Blood transfusion without reported diagnosis    Body mass index (BMI) 35.0-35.9, adult 04/05/2019   Chronic fatigue 01/28/2015   Chronic headaches    on cymbalta   Chronic migraine w/o aura, not intractable, w/o stat migr 10/24/2018   Cirrhosis ([place])    Colon polyps    Complication of anesthesia    problems waking up from anesthesia   Coronary artery disease 01/23/2019   Depression    on cymbalta   Diabetes mellitus with neuropathy ([place])    Diabetes with neuropathy 04/25/2013   Diverticulitis 03/2013   Dyslipidemia 05/29/2019   Eczema     Elevated LFTs    Epidural lipomatosis 10/05/2018   Essential (primary) hypertension 04/05/2019   Essential hypertension 10/10/2019   Fatty liver    GERD (gastroesophageal reflux disease) 04/28/2011   H/O craniotomy 05/07/2015   Headache 04/28/2011   Hepatitis 10/2017   NASH cirrhosis   History of kidney stones    Hyperlipidemia    Hypersomnia with sleep apnea 01/28/2015   Hypertension    IDA (iron deficiency anemia) 01/24/2019   Idiopathic intracranial hypertension 01/14/2017   Insomnia 04/26/2013   Kidney stone    Liver cirrhosis secondary to NASH (nonalcoholic steatohepatitis) ([place]) 01/02/2016   Low back pain 04/05/2019   Lower back injury 08/14/2019   Morbid obesity ([place])    Neuromuscular disorder ([place])    neuropathy   Neuropathy    Nonalcoholic steatohepatitis 12/04/6718   Obstructive hydrocephalus ([place]) 01/28/2015   OSA -- dx ~ 2012, cpap intolerant 09/04/2014    dx ~ 2012, cpap intolerant    PCP NOTES >>> 04/08/2015   Post-op pain 03/19/2019   Post-traumatic hydrocephalus (HCC)    s/p shunts x 2 (first got infected )   Presence of cerebrospinal fluid drainage device 07/28/2011   Psoriasis    sees Dr [name]   Psoriatic arthritis ([place])    REM behavioral disorder 01/14/2017   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  see's Dr.Ortmann   Severe obesity (BMI >= 40) (HCC) 01/28/2015   SI (sacroiliac) joint dysfunction 08/14/2019   Sigmoid diverticulitis 04/25/2013   Sleep apnea    no CPAP      Spondylolisthesis, lumbar region 03/16/2019   Stomach ulcer    Testosterone deficiency 04/28/2011   VP (ventriculoperitoneal) shunt status 07/31/2020    Past Surgical History:  Procedure Laterality Date   AMPUTATION Left 12/27/2021   Procedure: AMPUTATION GREAT TOE;  Surgeon: Trula Slade, DPM;  Location: [facility];  Service: Podiatry;  Laterality: Left;   BACK SURGERY  1980   BRAIN SURGERY     VP shunts placed in 2007   CHOLECYSTECTOMY N/A 08/25/2017   Procedure: LAPAROSCOPIC  CHOLECYSTECTOMY WITH INTRAOPERATIVE CHOLANGIOGRAM;  Surgeon: [name], MD;  Location: [facility];  Service: General;  Laterality: N/A;   COLONOSCOPY     CORONARY STENT INTERVENTION N/A 09/27/2020   Procedure: CORONARY STENT INTERVENTION;  Surgeon: Jettie Booze, MD;  Location: [facility];  Service: Cardiovascular;  Laterality: N/A;   INTRAVASCULAR ULTRASOUND/IVUS N/A 09/27/2020   Procedure: Intravascular Ultrasound/IVUS;  Surgeon: Jettie Booze, MD;  Location: [facility];  Service: Cardiovascular;  Laterality: N/A;   JOINT REPLACEMENT     total hip   LEFT HEART CATH N/A 09/27/2020   Procedure: Left Heart Cath;  Surgeon: Jettie Booze, MD;  Location: [facility];  Service: Cardiovascular;  Laterality: N/A;   LEFT HEART CATH AND CORONARY ANGIOGRAPHY N/A 09/24/2020   Procedure: LEFT HEART CATH AND CORONARY ANGIOGRAPHY;  Surgeon: Jettie Booze, MD;  Location: [facility];  Service: Cardiovascular;  Laterality: N/A;   LEFT HEART CATH AND CORONARY ANGIOGRAPHY N/A 08/18/2021   Procedure: LEFT HEART CATH AND CORONARY ANGIOGRAPHY;  Surgeon: Troy Sine, MD;  Location: [facility];  Service: Cardiovascular;  Laterality: N/A;   LUMBAR FUSION  03/16/2019   SHOULDER SURGERY Left 2010   TEE WITHOUT CARDIOVERSION N/A 01/01/2022   Procedure: TRANSESOPHAGEAL ECHOCARDIOGRAM (TEE);  Surgeon: Berniece Salines, DO;  Location: [facility];  Service: Cardiovascular;  Laterality: N/A;   TOE SURGERY Left 2018   TONSILLECTOMY     as a child   TOTAL HIP ARTHROPLASTY Left 2011   UPPER GASTROINTESTINAL ENDOSCOPY  01/04/2020   VENTRICULOPERITONEAL SHUNT  2007   x2    Current Medications: Current Meds  Medication Sig   acetaminophen (TYLENOL) 325 MG tablet Take 2 tablets (650 mg total) by mouth every 4 (four) hours as needed for headache or mild pain.   Adalimumab (HUMIRA, 2 PEN,) 40 MG/0.8ML PNKT Inject 40 mg (0.8 ml) under the skin every other week (Patient  taking differently: Inject 40 mg into the skin every 14 (fourteen) days.)   amLODipine (NORVASC) 5 MG tablet Take 1 tablet (5 mg total) by mouth daily.   aspirin 81 MG chewable tablet Chew 1 tablet (81 mg total) by mouth daily.   atorvastatin (LIPITOR) 80 MG tablet Take 1 tablet (80 mg total) by mouth at bedtime.   botulinum toxin Type A (BOTOX) 200 units injection Provider to inject 155 units into the muscles of the head and neck every 3 months. Discard remainder. (Patient taking differently: Inject 200 Units into the muscle every 3 (three) months. Provider to inject 155 units into the muscles of the head and neck every 3 months. Discard remainder.)   carvedilol (COREG) 12.5 MG tablet Take 1 tablet (12.5 mg total) by mouth 2 (two) times daily  with a meal.   clonazePAM (KLONOPIN) 0.5 MG tablet Take 1 tablet (0.5 mg total) by mouth at bedtime.   clopidogrel (PLAVIX) 75 MG tablet TAKE 1 TABLET (75 MG TOTAL) BY MOUTH DAILY.   Continuous Blood Gluc Sensor (FREESTYLE LIBRE 14 DAY SENSOR) MISC Apply 1 sensor every 14 (fourteen) days.   Continuous Blood Gluc Sensor (FREESTYLE LIBRE 3 SENSOR) MISC Place 1 sensor on the skin every 14 days. Use to check glucose continuously (Patient taking differently: 1 each by Other route daily. Place 1 sensor on the skin every 14 days. Use to check glucose continuously)   COVID-19 mRNA vaccine 2023-2024 (COMIRNATY) syringe Inject into the muscle. (Patient taking differently: Inject 0.3 mLs into the muscle once.)   DULoxetine (CYMBALTA) 60 MG capsule Take 2 capsules (120 mg total) by mouth daily.   EPINEPHrine (EPIPEN 2-PAK) 0.3 mg/0.3 mL IJ SOAJ injection Inject 0.3 mLs (0.3 mg total) into the muscle once as needed for up to 1 dose for anaphylaxis.   fenofibrate micronized (LOFIBRA) 134 MG capsule Take 1 capsule (134 mg total) by mouth 3 (three) times a week.   gabapentin (NEURONTIN) 600 MG tablet Take 1 tablet (600 mg total) by mouth 2 (two) times daily.   glimepiride  (AMARYL) 4 MG tablet Take 1 tablet (4 mg total) by mouth daily before breakfast.   isosorbide mononitrate (IMDUR) 60 MG 24 hr tablet Take 1 tablet (60 mg total) by mouth daily. *Please schedule follow up appt*   liraglutide (VICTOZA) 18 MG/3ML SOPN Start with 0.'6mg'$  subcutaneously once a day for 7 days, then increase to 1.'2mg'$  once a day (Patient taking differently: Inject 1.2 mg into the skin daily. Start with 0.'6mg'$  subcutaneously once a day for 7 days, then increase to 1.'2mg'$  once a day)   metFORMIN (GLUCOPHAGE) 500 MG tablet Take 2 tablets (1,000 mg total) by mouth 2 (two) times daily.   nitroGLYCERIN (NITROSTAT) 0.4 MG SL tablet Place 1 tablet (0.4 mg total) under the tongue every 5 (five) minutes for 3 doses as needed for chest pain.   pantoprazole (PROTONIX) 40 MG tablet Take 1 tablet (40 mg total) by mouth daily.     Allergies:   Bee venom, Hydrocodone bit-homatrop mbr, Toradol [ketorolac tromethamine], Prednisone, Sulfadiazine, Morphine and related, and Sulfa drugs cross reactors   Social History   Socioeconomic History   Marital status: Married    Spouse name: [name]   Number of children: 2   Years of education: Not on file   Highest education level: Not on file  Occupational History   Occupation: disabled   Tobacco Use   Smoking status: Never   Smokeless tobacco: Never  Vaping Use   Vaping Use: Never used  Substance and Sexual Activity   Alcohol use: No   Drug use: No   Sexual activity: Yes    Partners: Female  Other Topics Concern   Not on file  Social History Narrative   Household-- pt , wife, one adult son with Down's syndrome   younger son lives in [place] 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   Social Determinants of Health   Financial Resource Strain: Low Risk  (03/18/2021)   Overall Financial Resource Strain (CARDIA)    Difficulty of Paying Living Expenses: Not hard at all  Food Insecurity: No Food Insecurity (01/22/2022)    Hunger Vital Sign    Worried About Running Out of Food in the Last Year: Never true  Ran Out of Food in the Last Year: Never true  Transportation Needs: No Transportation Needs (01/22/2022)   PRAPARE - Hydrologist (Medical): No    Lack of Transportation (Non-Medical): No  Physical Activity: Inactive (03/18/2021)   Exercise Vital Sign    Days of Exercise per Week: 0 days    Minutes of Exercise per Session: 0 min  Stress: No Stress Concern Present (03/18/2021)   [place]    Feeling of Stress : Not at all  Social Connections: Moderately Integrated (03/18/2021)   Social Connection and Isolation Panel [NHANES]    Frequency of Communication with Friends and Family: More than three times a week    Frequency of Social Gatherings with Friends and Family: Once a week    Attends Religious Services: Never    Marine scientist or Organizations: Yes    Attends Music therapist: 1 to 4 times per year    Marital Status: Married     Family History: The patient's family history includes Down syndrome in his son; Heart disease in his brother; Lung cancer in his father; Other in his brother and mother. There is no history of Diabetes, Prostate cancer, Colon cancer, Stomach cancer, Pancreatic cancer, or Liver disease. ROS:   Please see the history of present illness.    All 14 point review of systems negative except as described per history of present illness  EKGs/Labs/Other Studies Reviewed:      Recent Labs: 12/31/2021: Magnesium 1.8 01/30/2022: ALT 25 02/09/2022: Hemoglobin 12.0; Platelets 174 05/11/2022: BUN 32; Creatinine, Ser 1.64; Potassium 4.5; Sodium 137  Recent Lipid Panel    Component Value Date/Time   CHOL 137 01/30/2022 1007   CHOL 152 10/30/2020 0920   TRIG 137.0 01/30/2022 1007   HDL 42.10 01/30/2022 1007   HDL 39 (L) 10/30/2020 0920   CHOLHDL 3 01/30/2022 1007   VLDL  27.4 01/30/2022 1007   LDLCALC 68 01/30/2022 1007   LDLCALC 68 10/30/2020 0920   LDLDIRECT 67.0 12/31/2020 0936    Physical Exam:    VS:  BP (!) 152/70 (BP Location: Left Arm, Patient Position: Sitting)   Pulse 71   Ht '5\' 11"'$  (1.803 m)   Wt 256 lb (116.1 kg)   SpO2 96%   BMI 35.70 kg/m     Wt Readings from Last 3 Encounters:  07/14/22 256 lb (116.1 kg)  06/18/22 252 lb 9.6 oz (114.6 kg)  05/26/22 245 lb (111.1 kg)     GEN:  Well nourished, well developed in no acute distress HEENT: Normal NECK: No JVD; No carotid bruits LYMPHATICS: No lymphadenopathy CARDIAC: RRR, no murmurs, no rubs, no gallops RESPIRATORY:  Clear to auscultation without rales, wheezing or rhonchi  ABDOMEN: Soft, non-tender, non-distended MUSCULOSKELETAL:  No edema; No deformity  SKIN: Warm and dry LOWER EXTREMITIES: no swelling NEUROLOGIC:  Alert and oriented x 3 PSYCHIATRIC:  Normal affect   ASSESSMENT:    1. Coronary artery disease involving native coronary artery of native heart with other form of angina pectoris ([facility])   2. Essential hypertension   3. Type 2 diabetes mellitus with stage 2 chronic kidney disease, without long-term current use of insulin (HCC)    PLAN:    In order of problems listed above:  Coronary disease.  He does have some symptoms which are very atypical.  I still think it will be safe to increase dose of Imdur from 60 to  120 mg to see if there is symptomatology improvement.  I see him in my office in about 3 months to assess that. Essential hypertension uncontrolled.  I will increase Imdur which should help with the blood pressure. Type 2 diabetes.  His last hemoglobin A1c is 7.1 this is from 05/11/2022.  Need to be better controlled. Dyslipidemia I did review his K PN LDL 68 HDL 42 he is on high intense statin which I will continue   Medication Adjustments/Labs and Tests Ordered: Current medicines are reviewed at length with the patient today.  Concerns regarding  medicines are outlined above.  No orders of the defined types were placed in this encounter.  Medication changes: No orders of the defined types were placed in this encounter.   Signed, Park Liter, MD, [facility] 07/14/2022 3:58 PM    [place]

## 2022-07-20 ENCOUNTER — Encounter: Payer: Self-pay | Admitting: Internal Medicine

## 2022-07-21 ENCOUNTER — Other Ambulatory Visit: Payer: Self-pay | Admitting: Internal Medicine

## 2022-07-21 ENCOUNTER — Other Ambulatory Visit (HOSPITAL_BASED_OUTPATIENT_CLINIC_OR_DEPARTMENT_OTHER): Payer: Self-pay

## 2022-07-21 MED ORDER — CARVEDILOL 12.5 MG PO TABS
12.5000 mg | ORAL_TABLET | Freq: Two times a day (BID) | ORAL | 0 refills | Status: DC
  Filled 2022-07-21: qty 180, 90d supply, fill #0

## 2022-07-22 ENCOUNTER — Other Ambulatory Visit (HOSPITAL_COMMUNITY): Payer: Self-pay

## 2022-07-23 ENCOUNTER — Ambulatory Visit (INDEPENDENT_AMBULATORY_CARE_PROVIDER_SITE_OTHER): Payer: 59 | Admitting: Internal Medicine

## 2022-07-23 ENCOUNTER — Other Ambulatory Visit (HOSPITAL_BASED_OUTPATIENT_CLINIC_OR_DEPARTMENT_OTHER): Payer: Self-pay

## 2022-07-23 ENCOUNTER — Encounter: Payer: Self-pay | Admitting: Internal Medicine

## 2022-07-23 VITALS — BP 122/66 | HR 74 | Temp 97.8°F | Resp 18 | Ht 71.0 in | Wt 256.1 lb

## 2022-07-23 DIAGNOSIS — I10 Essential (primary) hypertension: Secondary | ICD-10-CM

## 2022-07-23 DIAGNOSIS — E1122 Type 2 diabetes mellitus with diabetic chronic kidney disease: Secondary | ICD-10-CM

## 2022-07-23 DIAGNOSIS — N182 Chronic kidney disease, stage 2 (mild): Secondary | ICD-10-CM | POA: Diagnosis not present

## 2022-07-23 DIAGNOSIS — Z0000 Encounter for general adult medical examination without abnormal findings: Secondary | ICD-10-CM | POA: Diagnosis not present

## 2022-07-23 DIAGNOSIS — E785 Hyperlipidemia, unspecified: Secondary | ICD-10-CM | POA: Diagnosis not present

## 2022-07-23 LAB — CBC WITH DIFFERENTIAL/PLATELET
Basophils Absolute: 0 10*3/uL (ref 0.0–0.1)
Basophils Relative: 0.4 % (ref 0.0–3.0)
Eosinophils Absolute: 0.2 10*3/uL (ref 0.0–0.7)
Eosinophils Relative: 3.4 % (ref 0.0–5.0)
HCT: 39.1 % (ref 39.0–52.0)
Hemoglobin: 13.3 g/dL (ref 13.0–17.0)
Lymphocytes Relative: 27.8 % (ref 12.0–46.0)
Lymphs Abs: 1.4 10*3/uL (ref 0.7–4.0)
MCHC: 34.1 g/dL (ref 30.0–36.0)
MCV: 89.6 fl (ref 78.0–100.0)
Monocytes Absolute: 0.4 10*3/uL (ref 0.1–1.0)
Monocytes Relative: 8.4 % (ref 3.0–12.0)
Neutro Abs: 3.1 10*3/uL (ref 1.4–7.7)
Neutrophils Relative %: 60 % (ref 43.0–77.0)
Platelets: 143 10*3/uL — ABNORMAL LOW (ref 150.0–400.0)
RBC: 4.36 Mil/uL (ref 4.22–5.81)
RDW: 15.5 % (ref 11.5–15.5)
WBC: 5.2 10*3/uL (ref 4.0–10.5)

## 2022-07-23 LAB — PSA: PSA: 0.45 ng/mL (ref 0.10–4.00)

## 2022-07-23 NOTE — Patient Instructions (Addendum)
Vaccines I recommend:  Shingrix (shingles) RSV vaccine If your last COVID-vaccine was before September 2023, please get a booster  Consider restart a walking program, take a walk daily.  Signing for a gym membership might help you.  Check the  blood pressure regularly BP GOAL is between 110/65 and  135/85. If it is consistently higher or lower, let me know  Protect your kidney, drink plenty of water, avoid taking naproxen or ibuprofen   GO TO THE LAB : Get the blood work     [place], [place] back for checkup in 6 months    "Plantation of attorney" ,  "Living will" (Advance care planning documents)  If you already have a living will or healthcare power of attorney, is recommended you bring the copy to be scanned in your chart.   The document will be available to all the doctors you see in the system.  Advance care planning is a process that supports adults in  understanding and sharing their preferences regarding future medical care.  The patient's preferences are recorded in documents called Advance Directives and the can be modified at any time while the patient is in full mental capacity.   If you don't have one, please consider create one.      More information at: [email or web address]      Per our records you are due for your diabetic eye exam. Please contact your eye doctor to schedule an appointment. Please have them send copies of your office visit notes to [place]. Our fax number is [phone]. If you need a referral to an eye doctor please let us know.

## 2022-07-23 NOTE — Progress Notes (Signed)
Subjective:    Patient ID: James [name], male    DOB: January 22, 1956, 67 y.o.   MRN: [id number]  DOS:  07/23/2022 Type of visit - description: CPX   Here for CPX. Has no major concerns. He specifically denies nausea vomiting or blood in the stools. No GU symptoms.  Review of Systems   A 14 point review of systems is negative    Past Medical History:  Diagnosis Date   Abnormal cardiac CT angiography    Acid reflux    Annual physical exam 04/08/2015   Arthritis    Atypical chest pain 06/13/2020   Blood transfusion without reported diagnosis    Body mass index (BMI) 35.0-35.9, adult 04/05/2019   Chronic fatigue 01/28/2015   Chronic headaches    on cymbalta   Chronic migraine w/o aura, not intractable, w/o stat migr 10/24/2018   Cirrhosis ([place])    Colon polyps    Complication of anesthesia    problems waking up from anesthesia   Coronary artery disease 01/23/2019   Depression    on cymbalta   Diabetes mellitus with neuropathy ([place])    Diabetes with neuropathy 04/25/2013   Diverticulitis 03/2013   Dyslipidemia 05/29/2019   Eczema    Elevated LFTs    Epidural lipomatosis 10/05/2018   Essential (primary) hypertension 04/05/2019   Essential hypertension 10/10/2019   Fatty liver    GERD (gastroesophageal reflux disease) 04/28/2011   H/O craniotomy 05/07/2015   Headache 04/28/2011   Hepatitis 10/2017   NASH cirrhosis   History of kidney stones    Hyperlipidemia    Hypersomnia with sleep apnea 01/28/2015   Hypertension    IDA (iron deficiency anemia) 01/24/2019   Idiopathic intracranial hypertension 01/14/2017   Insomnia 04/26/2013   Kidney stone    Liver cirrhosis secondary to NASH (nonalcoholic steatohepatitis) ([place]) 01/02/2016   Low back pain 04/05/2019   Lower back injury 08/14/2019   Morbid obesity ([place])    Neuromuscular disorder ([place])    neuropathy   Neuropathy    Nonalcoholic steatohepatitis 09/03/9022   Obstructive hydrocephalus ([place]) 01/28/2015   OSA -- dx ~ 2012, cpap  intolerant 09/04/2014    dx ~ 2012, cpap intolerant    PCP NOTES >>> 04/08/2015   Post-op pain 03/19/2019   Post-traumatic hydrocephalus ([place])    s/p shunts x 2 (first got infected )   Presence of cerebrospinal fluid drainage device 07/28/2011   Psoriasis    sees Dr [name]   Psoriatic arthritis ([place])    REM behavioral disorder 01/14/2017   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   see's Dr.Ortmann   Severe obesity (BMI >= 40) ([place]) 01/28/2015   SI (sacroiliac) joint dysfunction 08/14/2019   Sigmoid diverticulitis 04/25/2013   Sleep apnea    no CPAP      Spondylolisthesis, lumbar region 03/16/2019   Stomach ulcer    Testosterone deficiency 04/28/2011   VP (ventriculoperitoneal) shunt status 07/31/2020    Past Surgical History:  Procedure Laterality Date   AMPUTATION Left 12/27/2021   Procedure: AMPUTATION GREAT TOE;  Surgeon: Trula Slade, DPM;  Location: [facility];  Service: Podiatry;  Laterality: Left;   BACK SURGERY  1980   BRAIN SURGERY     VP shunts placed in 2007   CHOLECYSTECTOMY N/A 08/25/2017   Procedure: LAPAROSCOPIC CHOLECYSTECTOMY WITH INTRAOPERATIVE CHOLANGIOGRAM;  Surgeon: [name], MD;  Location: [facility];  Service: General;  Laterality: N/A;   COLONOSCOPY     CORONARY STENT INTERVENTION N/A  09/27/2020   Procedure: CORONARY STENT INTERVENTION;  Surgeon: Jettie Booze, MD;  Location: [facility];  Service: Cardiovascular;  Laterality: N/A;   INTRAVASCULAR ULTRASOUND/IVUS N/A 09/27/2020   Procedure: Intravascular Ultrasound/IVUS;  Surgeon: Jettie Booze, MD;  Location: [facility];  Service: Cardiovascular;  Laterality: N/A;   JOINT REPLACEMENT     total hip   LEFT HEART CATH N/A 09/27/2020   Procedure: Left Heart Cath;  Surgeon: Jettie Booze, MD;  Location: [facility];  Service: Cardiovascular;  Laterality: N/A;   LEFT HEART CATH AND CORONARY ANGIOGRAPHY N/A 09/24/2020   Procedure: LEFT HEART CATH AND CORONARY  ANGIOGRAPHY;  Surgeon: Jettie Booze, MD;  Location: [facility];  Service: Cardiovascular;  Laterality: N/A;   LEFT HEART CATH AND CORONARY ANGIOGRAPHY N/A 08/18/2021   Procedure: LEFT HEART CATH AND CORONARY ANGIOGRAPHY;  Surgeon: Troy Sine, MD;  Location: [facility];  Service: Cardiovascular;  Laterality: N/A;   LUMBAR FUSION  03/16/2019   SHOULDER SURGERY Left 2010   TEE WITHOUT CARDIOVERSION N/A 01/01/2022   Procedure: TRANSESOPHAGEAL ECHOCARDIOGRAM (TEE);  Surgeon: Berniece Salines, DO;  Location: [facility];  Service: Cardiovascular;  Laterality: N/A;   TOE SURGERY Left 2018   TONSILLECTOMY     as a child   TOTAL HIP ARTHROPLASTY Left 2011   UPPER GASTROINTESTINAL ENDOSCOPY  01/04/2020   VENTRICULOPERITONEAL SHUNT  2007   x2   Social History   Socioeconomic History   Marital status: Married    Spouse name: [name]   Number of children: 2   Years of education: Not on file   Highest education level: Not on file  Occupational History   Occupation: disabled   Tobacco Use   Smoking status: Never   Smokeless tobacco: Never  Vaping Use   Vaping Use: Never used  Substance and Sexual Activity   Alcohol use: No   Drug use: No   Sexual activity: Yes    Partners: Female  Other Topics Concern   Not on file  Social History Narrative   Household-- pt , wife, one adult son with Down's syndrome   younger son lives in [place] 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   Social Determinants of Health   Financial Resource Strain: Low Risk  (03/18/2021)   Overall Financial Resource Strain (CARDIA)    Difficulty of Paying Living Expenses: Not hard at all  Food Insecurity: No Food Insecurity (01/22/2022)   Hunger Vital Sign    Worried About Running Out of Food in the Last Year: Never true    Ran Out of Food in the Last Year: Never true  Transportation Needs: No Transportation Needs (01/22/2022)   PRAPARE - Armed forces logistics/support/administrative officer (Medical): No    Lack of Transportation (Non-Medical): No  Physical Activity: Inactive (03/18/2021)   Exercise Vital Sign    Days of Exercise per Week: 0 days    Minutes of Exercise per Session: 0 min  Stress: No Stress Concern Present (03/18/2021)   [place]    Feeling of Stress : Not at all  Social Connections: Moderately Integrated (03/18/2021)   Social Connection and Isolation Panel [NHANES]    Frequency of Communication with Friends and Family: More than three times a week    Frequency of Social Gatherings with Friends and Family: Once a week    Attends  Religious Services: Never    Active Member of Clubs or Organizations: Yes    Attends Archivist Meetings: 1 to 4 times per year    Marital Status: Married  Human resources officer Violence: Not At Risk (01/22/2022)   Humiliation, Afraid, Rape, and Kick questionnaire    Fear of Current or Ex-Partner: No    Emotionally Abused: No    Physically Abused: No    Sexually Abused: No     Current Outpatient Medications  Medication Instructions   acetaminophen (TYLENOL) 650 mg, Oral, Every 4 hours PRN   Adalimumab (HUMIRA, 2 PEN,) 40 MG/0.8ML PNKT Inject 40 mg (0.8 ml) under the skin every other week   amLODipine (NORVASC) 5 mg, Oral, Daily   aspirin 81 mg, Oral, Daily   atorvastatin (LIPITOR) 80 mg, Oral, Daily at bedtime   botulinum toxin Type A (BOTOX) 200 units injection Provider to inject 155 units into the muscles of the head and neck every 3 months. Discard remainder.   carvedilol (COREG) 12.5 mg, Oral, 2 times daily with meals   clonazePAM (KLONOPIN) 0.5 mg, Oral, Daily at bedtime   clopidogrel (PLAVIX) 75 MG tablet TAKE 1 TABLET (75 MG TOTAL) BY MOUTH DAILY.   Continuous Blood Gluc Sensor (FREESTYLE LIBRE 14 DAY SENSOR) MISC Apply 1 sensor every 14 (fourteen) days.   Continuous Blood Gluc Sensor (FREESTYLE LIBRE 3 SENSOR) MISC Place 1  sensor on the skin every 14 days. Use to check glucose continuously   DULoxetine (CYMBALTA) 120 mg, Oral, Daily   EPINEPHrine (EPIPEN 2-PAK) 0.3 mg, Intramuscular, Once PRN   fenofibrate micronized (LOFIBRA) 134 mg, Oral, 3 times weekly   gabapentin (NEURONTIN) 600 mg, Oral, 2 times daily   glimepiride (AMARYL) 4 mg, Oral, Daily before breakfast   isosorbide mononitrate (IMDUR) 120 mg, Oral, Daily   liraglutide (VICTOZA) 18 MG/3ML SOPN Start with 0.'6mg'$  subcutaneously once a day for 7 days, then increase to 1.'2mg'$  once a day   metFORMIN (GLUCOPHAGE) 1,000 mg, Oral, 2 times daily   nitroGLYCERIN (NITROSTAT) 0.4 MG SL tablet Place 1 tablet (0.4 mg total) under the tongue every 5 (five) minutes for 3 doses as needed for chest pain.   pantoprazole (PROTONIX) 40 mg, Oral, Daily       Objective:   Physical Exam BP 122/66   Pulse 74   Temp 97.8 F (36.6 C) (Oral)   Resp 18   Ht '5\' 11"'$  (1.803 m)   Wt 256 lb 2 oz (116.2 kg)   SpO2 97%   BMI 35.72 kg/m  General: Well developed, NAD, BMI noted Neck: No  thyromegaly  HEENT:  Normocephalic . Face symmetric, atraumatic Lungs:  CTA B Normal respiratory effort, no intercostal retractions, no accessory muscle use. Heart: RRR,  no murmur.  Abdomen:  Not distended, soft, non-tender. No rebound or rigidity.   Lower extremities: no pretibial edema bilaterally  Skin: Exposed areas without rash. Not pale. Not jaundice Neurologic:  alert & oriented X3.  Speech normal, gait appropriate for age and unassisted Strength symmetric and appropriate for age.  Psych: Cognition and judgment appear intact.  Cooperative with normal attention span and concentration.  Behavior appropriate. No anxious or depressed appearing.     Assessment    Assessment  DM - Dr [name] Neuropathy (x years, rx gaba 05-2014, w/u 11-2014  RPR neg, vit D-B12-Folic Acid wnl ); Saw Dr Posey Pronto, [facility] [phone] (see results) HTN: History of AKI with ARBs  CRI, sees nephrology,  etiology- Hyperglycemia, intermittent NSAIDs, contrast  exposures Hyperlipidemia (TG in the 500s 2016) OSA , dx 2012, sleep study again 02-2015 Dr Dohmeier--> severe OSA, intolerant to CPAP Depression, insomnia: on Cymbalta NEURO: --Chronic headaches :on Cymbalta  --Posttraumatic hydrocephalus s/p 2 shunts (first got infected) MSK: on disability d/t back pain- HAs GI:  --GERD, diverticulitis 2014, h/o PUD -- NASH with cirrhosis per GI note 10/2017, s/p Hep A/B shots --Anemia: - felt to be d/t   GAVE (gastric antral vascular ectasia) and a inflammatory polyp, s/p  EGD 10/2018    -Work-up repeated 10/2019: EGD: GAVE  versus portal hypertensive gastropathy. Colonoscopy polyps.  Tubular adenoma. Gastric BX negative H. pylori, reactive changes. Psoriasis, psoriatic arthritis: used  HUMIRA  CAD: CP, PTCA of the LAD 09-2020; cath 07-2021 H/o urolithiasis Hypogonadism  Dx 2012, normal T 11-2014 (on no RX)  PLAN Here for CPX DM: [facility] End[facility] 06/18/2022, A1c was 7.1, was Rx Victoza.  Also on glimepiride and metformin. HTN: Seems well-controlled, no change CAD: [facility] cardiology 07/14/2022: Noted to have atypical sxs, Imdur dose increased.  BP was elevated. Liver cirrhosis Saw GI 04-2022, [place] for liver cancer screening was negative, repeat in 6 months CKD: Last creatinine stable, levels fluctuate.  Encourage good hydration and no NSAIDs RCT 6 months

## 2022-07-23 NOTE — Assessment & Plan Note (Signed)
-  Td 2023 - PNM 23: 2019 - PNM 20 : 11/2020 - shingrex: recommended - RSV rec. - If last COVID-vaccine was before 02-2022, recommend an update.   - Had a flu shot --Colonoscopy 07-2013,  had a polyp.  C-scope 12-2019, last GI note they are considering a colonoscopy this year. --Prostate cancer screening: No symptoms, no FH, check PSA. -Diet and exercise: Encouraged heart healthy diet, encourage increased physical activity, explained the benefits but he does not seem motivated. --Labs reviewed, will get a CBC PSA - Healthcare  POA: See AVS

## 2022-07-23 NOTE — Assessment & Plan Note (Signed)
Here for CPX DM: OV Endo 06/18/2022, A1c was 7.1, was Rx Victoza.  Also on glimepiride and metformin. HTN: Seems well-controlled, no change CAD: [facility] cardiology 07/14/2022: Noted to have atypical sxs, Imdur dose increased.  BP was elevated. Liver cirrhosis Saw GI 04-2022, [place] for liver cancer screening was negative, repeat in 6 months CKD: Last creatinine stable, levels fluctuate.  Encourage good hydration and no NSAIDs RCT 6 months

## 2022-07-30 ENCOUNTER — Other Ambulatory Visit (HOSPITAL_COMMUNITY): Payer: Self-pay

## 2022-07-30 ENCOUNTER — Other Ambulatory Visit (HOSPITAL_BASED_OUTPATIENT_CLINIC_OR_DEPARTMENT_OTHER): Payer: Self-pay

## 2022-07-30 ENCOUNTER — Other Ambulatory Visit: Payer: Self-pay

## 2022-07-31 ENCOUNTER — Other Ambulatory Visit (HOSPITAL_COMMUNITY): Payer: Self-pay

## 2022-08-03 ENCOUNTER — Other Ambulatory Visit (HOSPITAL_COMMUNITY): Payer: Self-pay

## 2022-08-04 ENCOUNTER — Other Ambulatory Visit: Payer: Self-pay

## 2022-08-05 ENCOUNTER — Telehealth: Payer: Self-pay | Admitting: Neurology

## 2022-08-05 NOTE — Telephone Encounter (Signed)
James [name], can you check and see if there is an [place] on file for this pt? 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 Thank you!

## 2022-08-06 ENCOUNTER — Other Ambulatory Visit (HOSPITAL_BASED_OUTPATIENT_CLINIC_OR_DEPARTMENT_OTHER): Payer: Self-pay

## 2022-08-06 ENCOUNTER — Other Ambulatory Visit: Payer: Self-pay

## 2022-08-10 ENCOUNTER — Other Ambulatory Visit: Payer: Self-pay

## 2022-08-10 ENCOUNTER — Other Ambulatory Visit (HOSPITAL_COMMUNITY): Payer: Self-pay

## 2022-08-10 NOTE — Telephone Encounter (Signed)
[facility]! Were you able to check to see if pt has an [place] on file? Pt is scheduled for botox injection for 08/17/22

## 2022-08-12 ENCOUNTER — Telehealth: Payer: Self-pay | Admitting: Neurology

## 2022-08-12 NOTE — Telephone Encounter (Signed)
Pt called stating that he is in [place] and is not able to come to his appt on th e19th. Pt would like to come about a week or two later if possible but nothing is available. Pt would like to discuss with RN.

## 2022-08-12 NOTE — Telephone Encounter (Signed)
Please call pt back. You can offer 10/12/22 at 2:30pm with Dr. Felecia Shelling and add to wait list. This is the only other available appt currently to offer

## 2022-08-13 NOTE — Telephone Encounter (Signed)
Noted  

## 2022-08-13 NOTE — Telephone Encounter (Signed)
Called pt back offered him 10/12/22 @ 2:30 pm with Dr. Felecia Shelling. Pt accepted appointment and he was placed on the waistlist for a soon appointment.

## 2022-08-14 NOTE — Telephone Encounter (Signed)
PA effective through 11/08/2022

## 2022-08-17 ENCOUNTER — Ambulatory Visit: Payer: Medicare Other | Admitting: Neurology

## 2022-08-25 ENCOUNTER — Other Ambulatory Visit (HOSPITAL_COMMUNITY): Payer: Self-pay

## 2022-08-27 ENCOUNTER — Other Ambulatory Visit: Payer: Self-pay

## 2022-08-27 ENCOUNTER — Other Ambulatory Visit (HOSPITAL_BASED_OUTPATIENT_CLINIC_OR_DEPARTMENT_OTHER): Payer: Self-pay

## 2022-08-28 ENCOUNTER — Other Ambulatory Visit (HOSPITAL_BASED_OUTPATIENT_CLINIC_OR_DEPARTMENT_OTHER): Payer: Self-pay

## 2022-08-28 ENCOUNTER — Other Ambulatory Visit (HOSPITAL_COMMUNITY): Payer: Self-pay

## 2022-08-31 ENCOUNTER — Other Ambulatory Visit (HOSPITAL_COMMUNITY): Payer: Self-pay

## 2022-09-01 ENCOUNTER — Other Ambulatory Visit (INDEPENDENT_AMBULATORY_CARE_PROVIDER_SITE_OTHER): Payer: 59

## 2022-09-01 DIAGNOSIS — E1165 Type 2 diabetes mellitus with hyperglycemia: Secondary | ICD-10-CM | POA: Diagnosis not present

## 2022-09-01 LAB — BASIC METABOLIC PANEL
BUN: 24 mg/dL — ABNORMAL HIGH (ref 6–23)
CO2: 21 mEq/L (ref 19–32)
Calcium: 9.4 mg/dL (ref 8.4–10.5)
Chloride: 106 mEq/L (ref 96–112)
Creatinine, Ser: 1.55 mg/dL — ABNORMAL HIGH (ref 0.40–1.50)
GFR: 47.01 mL/min — ABNORMAL LOW (ref >60.00)
Glucose, Bld: 184 mg/dL — ABNORMAL HIGH (ref 70–99)
Potassium: 4.4 mEq/L (ref 3.5–5.1)
Sodium: 139 mEq/L (ref 135–145)

## 2022-09-01 LAB — HEMOGLOBIN A1C: Hgb A1c MFr Bld: 7.7 % — ABNORMAL HIGH (ref 4.6–6.5)

## 2022-09-04 ENCOUNTER — Ambulatory Visit: Payer: Commercial Managed Care - PPO | Admitting: Endocrinology

## 2022-09-07 ENCOUNTER — Other Ambulatory Visit (HOSPITAL_BASED_OUTPATIENT_CLINIC_OR_DEPARTMENT_OTHER): Payer: Self-pay

## 2022-09-09 ENCOUNTER — Encounter: Payer: Self-pay | Admitting: Endocrinology

## 2022-09-09 ENCOUNTER — Ambulatory Visit (INDEPENDENT_AMBULATORY_CARE_PROVIDER_SITE_OTHER): Payer: 59 | Admitting: Endocrinology

## 2022-09-09 ENCOUNTER — Other Ambulatory Visit (HOSPITAL_BASED_OUTPATIENT_CLINIC_OR_DEPARTMENT_OTHER): Payer: Self-pay

## 2022-09-09 VITALS — BP 136/84 | HR 77 | Ht 71.0 in | Wt 252.4 lb

## 2022-09-09 DIAGNOSIS — E1165 Type 2 diabetes mellitus with hyperglycemia: Secondary | ICD-10-CM

## 2022-09-09 MED ORDER — DEXCOM G6 TRANSMITTER MISC
1.0000 | Freq: Once | 1 refills | Status: AC
  Filled 2022-09-09 – 2022-09-30 (×2): qty 1, 90d supply, fill #0

## 2022-09-09 MED ORDER — OMNIPOD 5 DEXG7G6 INTRO GEN 5 KIT
1.0000 | PACK | Freq: Once | 0 refills | Status: AC
  Filled 2022-09-09: qty 1, 90d supply, fill #0
  Filled 2022-09-30 – 2022-10-07 (×2): qty 1, 30d supply, fill #0

## 2022-09-09 MED ORDER — DEXCOM G6 SENSOR MISC
3 refills | Status: DC
  Filled 2022-09-09 – 2022-09-30 (×2): qty 3, 30d supply, fill #0

## 2022-09-09 MED ORDER — OMNIPOD 5 DEXG7G6 PODS GEN 5 MISC
1.0000 | 3 refills | Status: DC
  Filled 2022-09-09 – 2022-12-29 (×3): qty 10, 30d supply, fill #0
  Filled 2023-03-01: qty 10, 30d supply, fill #1
  Filled 2023-04-23: qty 10, 30d supply, fill #2
  Filled 2023-06-25: qty 10, 30d supply, fill #3

## 2022-09-09 NOTE — Progress Notes (Signed)
Patient ID: James [name], male   DOB: 1955-07-02, 67 y.o.   MRN: [id number]           Reason for Appointment: Follow-up for Type 2 Diabetes   History of Present Illness:          Date of diagnosis of type 2 diabetes mellitus:  ?  2014      Background history:  He is not clear how his diabetes was diagnosed, likely on routine testing. Initially had been treated with metformin and also tried on Amaryl Patient had progressive increase in his blood sugars since 1/17 with stopping metformin and being on the regimen of Amaryl and Januvia, he thinks his blood sugars went up to 601.  He was then started on basal bolus insulin   Lowest A1c was 6.8 previously  OMNIPOD insulin pump settings previously as follows Basal rates: 12 AM-6 AM = 0.1, 6 AM-11 AM = 0.5, 11 AM- 9:30 PM = 1.0 and 9:30 PM-12 AM = 1.2 Correction 1: 50 carbohydrate coverage 1: 1   Recent history:    Non-insulin hypoglycemic drugs the patient is taking are:  metformin 1000 mg a.m., 1000 mg p.m. daily, Amaryl 4 mg dinner  His A1c is higher at 7.7 compared to 7.1  Current management, blood sugar patterns and problems identified:  Since he would not be compliant with Ozempic he was told to start Victoza in December However he only tried it for 2 weeks and he felt that he did not want to continue injections every day and he stopped it.  Also likely did not increase the dose to 1.2 mg, did not let us know he stopped it His blood sugars are worse than on his last visit when they were 79% within target range Most of his high readings are after meals and late at night He is not watching his diet with eating more carbohydrates and sweets Recently not doing any physical activity He currently takes his Amaryl at bedtime instead of suppertime is also metformin He is currently using the libre 3 sensor  INTERPRETATION of the freestyle libre sensor download for the last 2 weeks as follows  Overnight blood sugars are mostly  significantly high rising after about 10 PM and frequently over 250 but been generally declining gradually by 8 AM without hypoglycemia Fasting blood sugars are averaging about 146 Blood sugar subsequently rise progressively until 3 PM  He has periods of hyperglycemia in the afternoons and early evenings but his Premeal blood sugar at dinnertime is still high at 180 Blood sugars after evening meal variable and highest late at night as above No hypoglycemia with lowest blood sugars occasionally around 4 AM No CGM data available between 2/29 and 3/20  CGM use % of time 63  2-week average/GV 191  Time in range      49  %  % Time Above 180 34  % Time above 250 17  % Time Below 70      PRE-MEAL Fasting Lunch Dinner Bedtime Overall  Glucose range:       Averages: 146       POST-MEAL PC Breakfast PC Lunch PC Dinner  Glucose range:     Averages:  218 245    Previously  CGM use % of time 54  2-week average/GV 147/30  Time in range      79%  % Time Above 180 19  % Time above 250 2  % Time Below 70  PRE-MEAL Fasting Lunch Dinner Bedtime Overall  Glucose range:       Averages: 103   191    POST-MEAL PC Breakfast PC Lunch PC Dinner  Glucose range:     Averages:  162 171    Side  effects from medications have been: None  Compliance with the medical regimen: Fair  Glucose monitoring:   Glucometer:  Freestyle libre 14-day   Self-care:   Meal times are:  Breakfast variable, lunch: 12-3 PM Dinner: 6 PM    Dietician visit, most recent:09/2015               CDE consultation: 12/2018  Weight history:  Wt Readings from Last 3 Encounters:  09/09/22 252 lb 6.4 oz (114.5 kg)  07/23/22 256 lb 2 oz (116.2 kg)  07/14/22 256 lb (116.1 kg)    Glycemic control:   Lab Results  Component Value Date   HGBA1C 7.7 (H) 09/01/2022   HGBA1C 7.1 (H) 05/11/2022   HGBA1C 7.1 (H) 01/30/2022   Lab Results  Component Value Date   MICROALBUR 2.1 (H) 01/30/2022   LDLCALC 68 01/30/2022    CREATININE 1.55 (H) 09/01/2022    No visits with results within 1 Week(s) from this visit.  Latest known visit with results is:  Lab on 09/01/2022  Component Date Value Ref Range Status   Sodium 09/01/2022 139  135 - 145 mEq/L Final   Potassium 09/01/2022 4.4  3.5 - 5.1 mEq/L Final   Chloride 09/01/2022 106  96 - 112 mEq/L Final   CO2 09/01/2022 21  19 - 32 mEq/L Final   Glucose, Bld 09/01/2022 184 (H)  70 - 99 mg/dL Final   BUN 09/01/2022 24 (H)  6 - 23 mg/dL Final   Creatinine, Ser 09/01/2022 1.55 (H)  0.40 - 1.50 mg/dL Final   GFR 09/01/2022 47.01 (L)  >60.00 mL/min Final   Calculated using the CKD-EPI Creatinine Equation (2021)   Calcium 09/01/2022 9.4  8.4 - 10.5 mg/dL Final   Hgb A1c MFr Bld 09/01/2022 7.7 (H)  4.6 - 6.5 % Final   Glycemic Control Guidelines for People with Diabetes:Non Diabetic:  <6%Goal of Therapy: <7%Additional Action Suggested:  >8%        Allergies as of 09/09/2022       Reactions   Bee Venom Anaphylaxis   Hydrocodone Bit-homatrop Mbr Other (See Comments)   Hallucinations, confusion, delirium Depressed feeling   Toradol [ketorolac Tromethamine] Other (See Comments)   Hallucinations, confusion, delirium   Prednisone    Patient reports it causes cirrhosis to flare up   Sulfadiazine    NDC [id number] NDC [id number] NDC [id number]   Morphine And Related Other (See Comments)   Hallucinations, back in the 80s. States has taken vicodin before w/o problems    Sulfa Drugs Cross Reactors Rash        Medication List        Accurate as of September 09, 2022  5:03 PM. If you have any questions, ask your nurse or doctor.          acetaminophen 325 MG tablet Commonly known as: TYLENOL Take 2 tablets (650 mg total) by mouth every 4 (four) hours as needed for headache or mild pain.   amLODipine 5 MG tablet Commonly known as: NORVASC Take 1 tablet (5 mg total) by mouth daily.   aspirin 81 MG chewable tablet Chew 1 tablet (81  mg total) by mouth daily.   atorvastatin 80 MG tablet Commonly known as: LIPITOR  Take 1 tablet (80 mg total) by mouth at bedtime.   Botox 200 units injection Generic drug: botulinum toxin Type A Provider to inject 155 units into the muscles of the head and neck every 3 months. Discard remainder. What changed:  how much to take how to take this when to take this   carvedilol 12.5 MG tablet Commonly known as: COREG Take 1 tablet (12.5 mg total) by mouth 2 (two) times daily with a meal.   clonazePAM 0.5 MG tablet Commonly known as: KLONOPIN Take 1 tablet (0.5 mg total) by mouth at bedtime.   clopidogrel 75 MG tablet Commonly known as: PLAVIX TAKE 1 TABLET (75 MG TOTAL) BY MOUTH DAILY.   Dexcom G6 Transmitter Misc 1 Device by Does not apply route once for 1 dose. Started by: Elayne Snare, MD   DULoxetine 60 MG capsule Commonly known as: CYMBALTA Take 2 capsules (120 mg total) by mouth daily.   EPINEPHrine 0.3 mg/0.3 mL Soaj injection Commonly known as: EpiPen 2-Pak Inject 0.3 mLs (0.3 mg total) into the muscle once as needed for up to 1 dose for anaphylaxis.   fenofibrate micronized 134 MG capsule Commonly known as: LOFIBRA Take 1 capsule (134 mg total) by mouth 3 (three) times a week.   FreeStyle Libre 3 Sensor Misc Place 1 sensor on the skin every 14 days. Use to check glucose continuously What changed:  how much to take how to take this when to take this   Dexcom G6 Sensor Misc Use to monitor blood sugar, change after 10 days What changed:  how much to take how to take this when to take this additional instructions Changed by: Elayne Snare, MD   gabapentin 600 MG tablet Commonly known as: NEURONTIN Take 1 tablet (600 mg total) by mouth 2 (two) times daily.   glimepiride 4 MG tablet Commonly known as: AMARYL Take 1 tablet (4 mg total) by mouth daily before breakfast.   Humira (2 Pen) 40 MG/0.8ML Pnkt Generic drug: Adalimumab Inject 40 mg (0.8 ml) under  the skin every other week What changed:  how much to take how to take this when to take this   isosorbide mononitrate 120 MG 24 hr tablet Commonly known as: IMDUR Take 1 tablet (120 mg total) by mouth daily.   metFORMIN 500 MG tablet Commonly known as: GLUCOPHAGE Take 2 tablets (1,000 mg total) by mouth 2 (two) times daily.   nitroGLYCERIN 0.4 MG SL tablet Commonly known as: NITROSTAT Place 1 tablet (0.4 mg total) under the tongue every 5 (five) minutes for 3 doses as needed for chest pain.   Omnipod 5 G6 Intro (Gen 5) Kit 1 kit by Does not apply route once for 1 dose. Started by: Elayne Snare, MD   Omnipod 5 G6 Pods (Gen 5) Misc 1 Device by Does not apply route every 3 (three) days. Started by: Elayne Snare, MD   pantoprazole 40 MG tablet Commonly known as: PROTONIX Take 1 tablet (40 mg total) by mouth daily.   Victoza 18 MG/3ML Sopn Generic drug: liraglutide Start with 0.'6mg'$  subcutaneously once a day for 7 days, then increase to 1.'2mg'$  once a day What changed:  how much to take how to take this when to take this        Allergies:  Allergies  Allergen Reactions   Bee Venom Anaphylaxis   Hydrocodone Bit-Homatrop Mbr Other (See Comments)    Hallucinations, confusion, delirium Depressed feeling   Toradol [Ketorolac Tromethamine] Other (See Comments)  Hallucinations, confusion, delirium   Prednisone     Patient reports it causes cirrhosis to flare up   Sulfadiazine     NDC [id number] [place] [id number] NDC [id number]   Morphine And Related Other (See Comments)    Hallucinations, back in the 80s. States has taken vicodin before w/o problems    Sulfa Drugs Cross Reactors Rash    Past Medical History:  Diagnosis Date   Abnormal cardiac CT angiography    Acid reflux    Annual physical exam 04/08/2015   Arthritis    Atypical chest pain 06/13/2020   Blood transfusion without reported diagnosis    Body mass index (BMI) 35.0-35.9, adult 04/05/2019    Chronic fatigue 01/28/2015   Chronic headaches    on cymbalta   Chronic migraine w/o aura, not intractable, w/o stat migr 10/24/2018   Cirrhosis ([place])    Colon polyps    Complication of anesthesia    problems waking up from anesthesia   Coronary artery disease 01/23/2019   Depression    on cymbalta   Diabetes mellitus with neuropathy ([place])    Diabetes with neuropathy 04/25/2013   Diverticulitis 03/2013   Dyslipidemia 05/29/2019   Eczema    Elevated LFTs    Epidural lipomatosis 10/05/2018   Essential (primary) hypertension 04/05/2019   Essential hypertension 10/10/2019   Fatty liver    GERD (gastroesophageal reflux disease) 04/28/2011   H/O craniotomy 05/07/2015   Headache 04/28/2011   Hepatitis 10/2017   NASH cirrhosis   History of kidney stones    Hyperlipidemia    Hypersomnia with sleep apnea 01/28/2015   Hypertension    IDA (iron deficiency anemia) 01/24/2019   Idiopathic intracranial hypertension 01/14/2017   Insomnia 04/26/2013   Kidney stone    Liver cirrhosis secondary to NASH (nonalcoholic steatohepatitis) ([place]) 01/02/2016   Low back pain 04/05/2019   Lower back injury 08/14/2019   Morbid obesity ([place])    Neuromuscular disorder ([place])    neuropathy   Neuropathy    Nonalcoholic steatohepatitis 0000000   Obstructive hydrocephalus ([place]) 01/28/2015   OSA -- dx ~ 2012, cpap intolerant 09/04/2014    dx ~ 2012, cpap intolerant    PCP NOTES >>> 04/08/2015   Post-op pain 03/19/2019   Post-traumatic hydrocephalus ([place])    s/p shunts x 2 (first got infected )   Presence of cerebrospinal fluid drainage device 07/28/2011   Psoriasis    sees Dr [name]   Psoriatic arthritis ([place])    REM behavioral disorder 01/14/2017   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   see's Dr.Ortmann   Severe obesity (BMI >= 40) ([place]) 01/28/2015   SI (sacroiliac) joint dysfunction 08/14/2019   Sigmoid diverticulitis 04/25/2013   Sleep apnea    no CPAP      Spondylolisthesis, lumbar region  03/16/2019   Stomach ulcer    Testosterone deficiency 04/28/2011   VP (ventriculoperitoneal) shunt status 07/31/2020    Past Surgical History:  Procedure Laterality Date   AMPUTATION Left 12/27/2021   Procedure: AMPUTATION GREAT TOE;  Surgeon: Trula Slade, DPM;  Location: [facility];  Service: Podiatry;  Laterality: Left;   BACK SURGERY  1980   BRAIN SURGERY     VP shunts placed in 2007   CHOLECYSTECTOMY N/A 08/25/2017   Procedure: LAPAROSCOPIC CHOLECYSTECTOMY WITH INTRAOPERATIVE CHOLANGIOGRAM;  Surgeon: [name], MD;  Location: [facility];  Service: General;  Laterality: N/A;   COLONOSCOPY     CORONARY STENT INTERVENTION N/A 09/27/2020   Procedure: CORONARY  STENT INTERVENTION;  Surgeon: Jettie Booze, MD;  Location: [facility];  Service: Cardiovascular;  Laterality: N/A;   INTRAVASCULAR ULTRASOUND/IVUS N/A 09/27/2020   Procedure: Intravascular Ultrasound/IVUS;  Surgeon: Jettie Booze, MD;  Location: [facility];  Service: Cardiovascular;  Laterality: N/A;   JOINT REPLACEMENT     total hip   LEFT HEART CATH N/A 09/27/2020   Procedure: Left Heart Cath;  Surgeon: Jettie Booze, MD;  Location: [facility];  Service: Cardiovascular;  Laterality: N/A;   LEFT HEART CATH AND CORONARY ANGIOGRAPHY N/A 09/24/2020   Procedure: LEFT HEART CATH AND CORONARY ANGIOGRAPHY;  Surgeon: Jettie Booze, MD;  Location: [facility];  Service: Cardiovascular;  Laterality: N/A;   LEFT HEART CATH AND CORONARY ANGIOGRAPHY N/A 08/18/2021   Procedure: LEFT HEART CATH AND CORONARY ANGIOGRAPHY;  Surgeon: Troy Sine, MD;  Location: [facility];  Service: Cardiovascular;  Laterality: N/A;   LUMBAR FUSION  03/16/2019   SHOULDER SURGERY Left 2010   TEE WITHOUT CARDIOVERSION N/A 01/01/2022   Procedure: TRANSESOPHAGEAL ECHOCARDIOGRAM (TEE);  Surgeon: Berniece Salines, DO;  Location: [facility];  Service: Cardiovascular;  Laterality: N/A;   TOE SURGERY Left 2018    TONSILLECTOMY     as a child   TOTAL HIP ARTHROPLASTY Left 2011   UPPER GASTROINTESTINAL ENDOSCOPY  01/04/2020   VENTRICULOPERITONEAL SHUNT  2007   x2    Family History  Problem Relation Age of Onset   Other Mother    Lung cancer Father        alive, former smoker    Heart disease Brother        MI age 79   Other Brother        Murdered   Down syndrome Son    Diabetes Neg Hx    Prostate cancer Neg Hx    Colon cancer Neg Hx    Stomach cancer Neg Hx    Pancreatic cancer Neg Hx    Liver disease Neg Hx     Social History:  reports that he has never smoked. He has never used smokeless tobacco. He reports that he does not drink alcohol and does not use drugs.    Review of Systems      HYPERTENSION: On carvedilol with good control Hypertension managed by his PCP   BP Readings from Last 3 Encounters:  09/09/22 136/84  07/23/22 122/66  07/14/22 (!) 152/70   RENAL dysfunction: Has had mildly increased creatinine levels, again stable No history of microalbuminuria  Lab Results  Component Value Date   CREATININE 1.55 (H) 09/01/2022   CREATININE 1.6 (A) 07/06/2022   CREATININE 1.64 (H) 05/11/2022    Lipid history: He is on Lipitor 80 mg from his PCP  Is on fenofibrate 3 times a week for high triglycerides,  LDL is below 70 and triglycerides are normal    Lab Results  Component Value Date   CHOL 137 01/30/2022   CHOL 118 08/16/2021   CHOL 139 03/04/2021   Lab Results  Component Value Date   HDL 42.10 01/30/2022   HDL 43 08/16/2021   HDL 39.90 03/04/2021   Lab Results  Component Value Date   LDLCALC 68 01/30/2022   LDLCALC 55 08/16/2021   LDLCALC 69 03/04/2021   Lab Results  Component Value Date   TRIG 137.0 01/30/2022   TRIG 101 08/16/2021   TRIG 151.0 (H) 03/04/2021   Lab Results  Component Value Date  CHOLHDL 3 01/30/2022   CHOLHDL 2.7 08/16/2021   CHOLHDL 3 03/04/2021   Lab Results  Component Value Date   LDLDIRECT 67.0 12/31/2020    LDLDIRECT 91.0 06/11/2020   LDLDIRECT 83.0 10/09/2019            He is being followed by gastroenterology for liver cirrhosis secondary to [facility]  Lab Results  Component Value Date   ALT 19 07/06/2022     He has symptomatic painful neuropathy and is on gabapentin 600 mg twice a day and  Cymbalta 60 mg  Has been followed by gastroenterologist for NASH  Lab Results  Component Value Date   ALT 19 07/06/2022     Review of Systems    Physical Examination:  BP 136/84 (BP Location: Left Arm, Patient Position: Sitting, Cuff Size: Normal)   Pulse 77   Ht '5\' 11"'$  (1.803 m)   Wt 252 lb 6.4 oz (114.5 kg)   SpO2 94%   BMI 35.20 kg/m       ASSESSMENT:  Diabetes type 2 with obesity  See history of present illness for discussion of current diabetes management, blood sugar patterns and problems identified  Currently on a regimen of AMARYL and Metformin 2000 mg a day  A1c is relatively higher at 7.7  He did not take the Victoza as directed and likely will do injections especially multiple insulin injections  Also has difficulty monitoring his diet and carbohydrate intake but was reluctant to take Ozempic because of nausea and irregular schedule  Renal function: Stable  PLAN: Discussed with him the need for better postprandial control He does start back on the OmniPod insulin pump  Will see if he is eligible for the OmniPod 5 pump which will help regulate his basal rates Will also be scheduled for training with the diabetes educator Meanwhile he will need to make sure he is watching his diet with carbohydrates and sweets Encouraged him to start more physical activity Change evening meal metformin and Amaryl to suppertime instead of bedtime    Patient Instructions  Glimeperide and Metformin at dinner time        Elayne Snare 09/09/2022, 5:03 PM

## 2022-09-09 NOTE — Patient Instructions (Signed)
Glimeperide and Metformin at dinner time

## 2022-09-10 ENCOUNTER — Encounter: Payer: Self-pay | Admitting: Endocrinology

## 2022-09-10 ENCOUNTER — Other Ambulatory Visit (HOSPITAL_BASED_OUTPATIENT_CLINIC_OR_DEPARTMENT_OTHER): Payer: Self-pay

## 2022-09-14 ENCOUNTER — Other Ambulatory Visit (HOSPITAL_BASED_OUTPATIENT_CLINIC_OR_DEPARTMENT_OTHER): Payer: Self-pay

## 2022-09-15 ENCOUNTER — Other Ambulatory Visit (HOSPITAL_BASED_OUTPATIENT_CLINIC_OR_DEPARTMENT_OTHER): Payer: Self-pay

## 2022-09-16 ENCOUNTER — Other Ambulatory Visit (HOSPITAL_BASED_OUTPATIENT_CLINIC_OR_DEPARTMENT_OTHER): Payer: Self-pay

## 2022-09-17 ENCOUNTER — Other Ambulatory Visit (HOSPITAL_BASED_OUTPATIENT_CLINIC_OR_DEPARTMENT_OTHER): Payer: Self-pay

## 2022-09-20 ENCOUNTER — Other Ambulatory Visit: Payer: Self-pay | Admitting: Internal Medicine

## 2022-09-21 ENCOUNTER — Other Ambulatory Visit (HOSPITAL_BASED_OUTPATIENT_CLINIC_OR_DEPARTMENT_OTHER): Payer: Self-pay

## 2022-09-21 MED ORDER — PANTOPRAZOLE SODIUM 40 MG PO TBEC
40.0000 mg | DELAYED_RELEASE_TABLET | Freq: Every day | ORAL | 1 refills | Status: DC
  Filled 2022-09-21: qty 90, 90d supply, fill #0
  Filled 2022-12-21: qty 90, 90d supply, fill #1

## 2022-09-22 ENCOUNTER — Other Ambulatory Visit (HOSPITAL_COMMUNITY): Payer: Self-pay

## 2022-09-22 ENCOUNTER — Other Ambulatory Visit (HOSPITAL_BASED_OUTPATIENT_CLINIC_OR_DEPARTMENT_OTHER): Payer: Self-pay

## 2022-09-24 ENCOUNTER — Other Ambulatory Visit (HOSPITAL_BASED_OUTPATIENT_CLINIC_OR_DEPARTMENT_OTHER): Payer: Self-pay

## 2022-09-28 ENCOUNTER — Other Ambulatory Visit (HOSPITAL_BASED_OUTPATIENT_CLINIC_OR_DEPARTMENT_OTHER): Payer: Self-pay

## 2022-09-29 ENCOUNTER — Other Ambulatory Visit: Payer: Self-pay | Admitting: Internal Medicine

## 2022-09-29 ENCOUNTER — Other Ambulatory Visit (HOSPITAL_BASED_OUTPATIENT_CLINIC_OR_DEPARTMENT_OTHER): Payer: Self-pay

## 2022-09-29 ENCOUNTER — Other Ambulatory Visit: Payer: Self-pay

## 2022-09-30 ENCOUNTER — Other Ambulatory Visit (HOSPITAL_BASED_OUTPATIENT_CLINIC_OR_DEPARTMENT_OTHER): Payer: Self-pay

## 2022-09-30 MED ORDER — DULOXETINE HCL 60 MG PO CPEP
120.0000 mg | ORAL_CAPSULE | Freq: Every day | ORAL | 1 refills | Status: DC
  Filled 2022-09-30: qty 180, 90d supply, fill #0
  Filled 2022-12-21: qty 180, 90d supply, fill #1

## 2022-09-30 MED ORDER — GABAPENTIN 600 MG PO TABS
600.0000 mg | ORAL_TABLET | Freq: Two times a day (BID) | ORAL | 1 refills | Status: DC
  Filled 2022-09-30 – 2022-10-06 (×2): qty 180, 90d supply, fill #0
  Filled 2022-10-07: qty 60, 30d supply, fill #0
  Filled 2022-11-18: qty 180, 90d supply, fill #1
  Filled 2023-02-05: qty 180, 90d supply, fill #2
  Filled 2023-02-18: qty 120, 60d supply, fill #2

## 2022-09-30 MED ORDER — ATORVASTATIN CALCIUM 80 MG PO TABS
80.0000 mg | ORAL_TABLET | Freq: Every day | ORAL | 1 refills | Status: DC
  Filled 2022-09-30: qty 90, 90d supply, fill #0
  Filled 2022-12-29: qty 90, 90d supply, fill #1

## 2022-10-01 ENCOUNTER — Other Ambulatory Visit (HOSPITAL_BASED_OUTPATIENT_CLINIC_OR_DEPARTMENT_OTHER): Payer: Self-pay

## 2022-10-01 DIAGNOSIS — N1832 Chronic kidney disease, stage 3b: Secondary | ICD-10-CM | POA: Diagnosis not present

## 2022-10-01 DIAGNOSIS — K7469 Other cirrhosis of liver: Secondary | ICD-10-CM | POA: Diagnosis not present

## 2022-10-01 DIAGNOSIS — L409 Psoriasis, unspecified: Secondary | ICD-10-CM | POA: Diagnosis not present

## 2022-10-01 DIAGNOSIS — Z79899 Other long term (current) drug therapy: Secondary | ICD-10-CM | POA: Diagnosis not present

## 2022-10-01 MED ORDER — CLOBETASOL PROPIONATE 0.05 % EX SOLN
CUTANEOUS | 1 refills | Status: DC
  Filled 2022-10-01: qty 50, 14d supply, fill #0
  Filled 2022-12-29: qty 50, 14d supply, fill #1

## 2022-10-02 ENCOUNTER — Other Ambulatory Visit (HOSPITAL_BASED_OUTPATIENT_CLINIC_OR_DEPARTMENT_OTHER): Payer: Self-pay

## 2022-10-05 ENCOUNTER — Other Ambulatory Visit (HOSPITAL_BASED_OUTPATIENT_CLINIC_OR_DEPARTMENT_OTHER): Payer: Self-pay

## 2022-10-06 ENCOUNTER — Other Ambulatory Visit (HOSPITAL_BASED_OUTPATIENT_CLINIC_OR_DEPARTMENT_OTHER): Payer: Self-pay

## 2022-10-06 ENCOUNTER — Other Ambulatory Visit: Payer: Self-pay | Admitting: Cardiology

## 2022-10-06 DIAGNOSIS — E1165 Type 2 diabetes mellitus with hyperglycemia: Secondary | ICD-10-CM

## 2022-10-06 DIAGNOSIS — M5416 Radiculopathy, lumbar region: Secondary | ICD-10-CM | POA: Diagnosis not present

## 2022-10-06 MED ORDER — METFORMIN HCL 500 MG PO TABS
1000.0000 mg | ORAL_TABLET | Freq: Two times a day (BID) | ORAL | 0 refills | Status: DC
Start: 2022-10-06 — End: 2022-12-30
  Filled 2022-10-06: qty 120, 30d supply, fill #0

## 2022-10-06 NOTE — Telephone Encounter (Signed)
Rx to pharmacy, patient has scheduled appointment with Dr Bing Matter 10/16/22

## 2022-10-07 ENCOUNTER — Encounter: Payer: Self-pay | Admitting: Endocrinology

## 2022-10-07 ENCOUNTER — Ambulatory Visit (INDEPENDENT_AMBULATORY_CARE_PROVIDER_SITE_OTHER): Payer: 59 | Admitting: Endocrinology

## 2022-10-07 ENCOUNTER — Other Ambulatory Visit (HOSPITAL_BASED_OUTPATIENT_CLINIC_OR_DEPARTMENT_OTHER): Payer: Self-pay

## 2022-10-07 VITALS — BP 130/84 | HR 70 | Ht 71.0 in | Wt 254.0 lb

## 2022-10-07 DIAGNOSIS — E1165 Type 2 diabetes mellitus with hyperglycemia: Secondary | ICD-10-CM

## 2022-10-07 MED ORDER — "INSULIN SYRINGE 31G X 5/16"" 1 ML MISC"
0 refills | Status: AC
  Filled 2022-10-07: qty 100, 33d supply, fill #0

## 2022-10-07 MED ORDER — DEXCOM G6 TRANSMITTER MISC
1.0000 | Freq: Once | 1 refills | Status: AC
  Filled 2022-10-07: qty 1, fill #0
  Filled 2022-10-07 – 2022-10-13 (×3): qty 1, 90d supply, fill #0

## 2022-10-07 MED ORDER — INSULIN LISPRO 100 UNIT/ML IJ SOLN
INTRAMUSCULAR | 2 refills | Status: DC
  Filled 2022-10-07: qty 20, 33d supply, fill #0
  Filled 2022-12-29: qty 20, 33d supply, fill #1
  Filled 2023-03-01: qty 20, 33d supply, fill #2

## 2022-10-07 MED ORDER — DEXCOM G6 SENSOR MISC
3 refills | Status: DC
  Filled 2022-10-07 – 2022-10-08 (×5): qty 3, 30d supply, fill #0
  Filled 2022-11-18: qty 3, 30d supply, fill #1
  Filled 2022-12-29: qty 3, 30d supply, fill #2
  Filled 2023-03-01: qty 3, 30d supply, fill #3

## 2022-10-07 NOTE — Progress Notes (Signed)
Patient ID: James [name], male   DOB: [birth year], 67 y.o.   MRN: [id number]           Reason for Appointment: Follow-up for Type 2 Diabetes   History of Present Illness:          Date of diagnosis of type 2 diabetes mellitus:  ?  2014      Background history:  He is not clear how his diabetes was diagnosed, likely on routine testing. Initially had been treated with metformin and also tried on Amaryl Patient had progressive increase in his blood sugars since 1/17 with stopping metformin and being on the regimen of Amaryl and Januvia, he thinks his blood sugars went up to 601.  He was then started on basal bolus insulin   Lowest A1c was 6.8 previously  OMNIPOD insulin pump settings previously as follows Basal rates: 12 AM-6 AM = 0.1, 6 AM-11 AM = 0.5, 11 AM- 9:30 PM = 1.0 and 9:30 PM-12 AM = 1.2 Correction 1: 50 carbohydrate coverage 1: 1   Recent history:    Non-insulin hypoglycemic drugs the patient is taking are:  metformin 1000 mg a.m., 1000 mg p.m. daily, Amaryl 4 mg dinner  His A1c is last higher at 7.7 compared to 7.1  Current management, blood sugar patterns and problems identified:  He was prescribed the OmniPod 5 insulin pump about 4 weeks ago but he still has not been picked up the supplies even though he was told the pump was approved, also needed prescription for the insulin He did not call to request the needed prescriptions and has not made any change in his treatment regimen He was told to also try Victoza daily since he was reluctant to take weekly shots but he has not done so With this his blood sugars are again going markedly higher after meals averaging nearly 300 at bedtime Also yesterday apparently had an epidural shot for his back and his blood sugars are over 350 consistently since yesterday Complaining of increased thirst He is currently using the libre 3 sensor  Recent libre 3 download shows AVERAGE 242 Overnight readings of the lowest between  6-9 AM averaging about 170 except last night and progressively higher until about midnight  Previous data:  CGM use % of time 63  2-week average/GV 191  Time in range      49  %  % Time Above 180 34  % Time above 250 17  % Time Below 70      PRE-MEAL Fasting Lunch Dinner Bedtime Overall  Glucose range:       Averages: 146       POST-MEAL PC Breakfast PC Lunch PC Dinner  Glucose range:     Averages:  218 245     Side  effects from medications have been: None  Compliance with the medical regimen: Fair  Glucose monitoring:   Glucometer:  Freestyle libre 14-day   Self-care:   Meal times are:  Breakfast variable, lunch: 12-3 PM Dinner: 6 PM    Dietician visit, most recent:09/2015               CDE consultation: 12/2018  Weight history:  Wt Readings from Last 3 Encounters:  10/07/22 254 lb (115.2 kg)  09/09/22 252 lb 6.4 oz (114.5 kg)  07/23/22 256 lb 2 oz (116.2 kg)    Glycemic control:   Lab Results  Component Value Date   HGBA1C 7.7 (H) 09/01/2022   HGBA1C  7.1 (H) 05/11/2022   HGBA1C 7.1 (H) 01/30/2022   Lab Results  Component Value Date   MICROALBUR 2.1 (H) 01/30/2022   LDLCALC 68 01/30/2022   CREATININE 1.55 (H) 09/01/2022    No visits with results within 1 Week(s) from this visit.  Latest known visit with results is:  Lab on 09/01/2022  Component Date Value Ref Range Status   Sodium 09/01/2022 139  135 - 145 mEq/L Final   Potassium 09/01/2022 4.4  3.5 - 5.1 mEq/L Final   Chloride 09/01/2022 106  96 - 112 mEq/L Final   CO2 09/01/2022 21  19 - 32 mEq/L Final   Glucose, Bld 09/01/2022 184 (H)  70 - 99 mg/dL Final   BUN 11/65/7903 24 (H)  6 - 23 mg/dL Final   Creatinine, Ser 09/01/2022 1.55 (H)  0.40 - 1.50 mg/dL Final   GFR 83/33/8329 47.01 (L)  >60.00 mL/min Final   Calculated using the CKD-EPI Creatinine Equation (2021)   Calcium 09/01/2022 9.4  8.4 - 10.5 mg/dL Final   Hgb V9T MFr Bld 09/01/2022 7.7 (H)  4.6 - 6.5 % Final   Glycemic Control  Guidelines for People with Diabetes:Non Diabetic:  <6%Goal of Therapy: <7%Additional Action Suggested:  >8%        Allergies as of 10/07/2022       Reactions   Bee Venom Anaphylaxis   Hydrocodone Bit-homatrop Mbr Other (See Comments)   Hallucinations, confusion, delirium Depressed feeling   Toradol [ketorolac Tromethamine] Other (See Comments)   Hallucinations, confusion, delirium   Prednisone    Patient reports it causes cirrhosis to flare up   Sulfadiazine    NDC YOMA:[id number] NDC ELTR:[id number] NDC SHUO:[id number]   Morphine And Related Other (See Comments)   Hallucinations, back in the 80s. States has taken vicodin before w/o problems    Sulfa Drugs Cross Reactors Rash        Medication List        Accurate as of October 07, 2022  3:30 PM. If you have any questions, ask your nurse or doctor.          acetaminophen 325 MG tablet Commonly known as: TYLENOL Take 2 tablets (650 mg total) by mouth every 4 (four) hours as needed for headache or mild pain.   amLODipine 5 MG tablet Commonly known as: NORVASC Take 1 tablet (5 mg total) by mouth daily.   aspirin 81 MG chewable tablet Chew 1 tablet (81 mg total) by mouth daily.   atorvastatin 80 MG tablet Commonly known as: LIPITOR Take 1 tablet (80 mg total) by mouth at bedtime.   Botox 200 units injection Generic drug: botulinum toxin Type A Provider to inject 155 units into the muscles of the head and neck every 3 months. Discard remainder. What changed:  how much to take how to take this when to take this   carvedilol 12.5 MG tablet Commonly known as: COREG Take 1 tablet (12.5 mg total) by mouth 2 (two) times daily with a meal.   clobetasol 0.05 % external solution Commonly known as: TEMOVATE Apply 1 application externally twice a day 14 days.   clonazePAM 0.5 MG tablet Commonly known as: KLONOPIN Take 1 tablet (0.5 mg total) by mouth at bedtime.   clopidogrel 75 MG tablet Commonly known as:  PLAVIX TAKE 1 TABLET (75 MG TOTAL) BY MOUTH DAILY.   Dexcom G6 Sensor Misc Use to monitor blood sugar, change after 10 days What changed: Another medication with the same name was removed.  Continue taking this medication, and follow the directions you see here. Changed by: Reather Littler, MD   Dexcom G6 Transmitter Misc 1 Device by Does not apply route once for 1 dose every 90 days. Started by: Reather Littler, MD   DULoxetine 60 MG capsule Commonly known as: CYMBALTA Take 2 capsules (120 mg total) by mouth daily.   EPINEPHrine 0.3 mg/0.3 mL Soaj injection Commonly known as: EpiPen 2-Pak Inject 0.3 mLs (0.3 mg total) into the muscle once as needed for up to 1 dose for anaphylaxis.   fenofibrate micronized 134 MG capsule Commonly known as: LOFIBRA Take 1 capsule (134 mg total) by mouth 3 (three) times a week.   gabapentin 600 MG tablet Commonly known as: NEURONTIN Take 1 tablet (600 mg total) by mouth 2 (two) times daily.   glimepiride 4 MG tablet Commonly known as: AMARYL Take 1 tablet (4 mg total) by mouth daily before breakfast.   HumaLOG 100 UNIT/ML injection Generic drug: insulin lispro Use up to 60 Units daily in pump Started by: Reather Littler, MD   Humira (2 Pen) 40 MG/0.8ML Pnkt Generic drug: Adalimumab Inject 40 mg (0.8 ml) under the skin every other week What changed:  how much to take how to take this when to take this   isosorbide mononitrate 120 MG 24 hr tablet Commonly known as: IMDUR Take 1 tablet (120 mg total) by mouth daily.   metFORMIN 500 MG tablet Commonly known as: GLUCOPHAGE Take 2 tablets (1,000 mg total) by mouth 2 (two) times daily.   nitroGLYCERIN 0.4 MG SL tablet Commonly known as: NITROSTAT Place 1 tablet (0.4 mg total) under the tongue every 5 (five) minutes for 3 doses as needed for chest pain.   Omnipod 5 G6 Intro (Gen 5) Kit 1 kit by Does not apply route once for 1 dose.   Omnipod 5 G6 Pods (Gen 5) Misc 1 Device by Does not apply route  every 3 (three) days.   pantoprazole 40 MG tablet Commonly known as: PROTONIX Take 1 tablet (40 mg total) by mouth daily.   UltiCare Insulin Syringe 31G X 5/16" 1 ML Misc Generic drug: Insulin Syringe-Needle U-100 Use to administer Humalog 3 times a day Started by: Reather Littler, MD   Victoza 18 MG/3ML Sopn Generic drug: liraglutide Start with 0.6mg  subcutaneously once a day for 7 days, then increase to 1.2mg  once a day        Allergies:  Allergies  Allergen Reactions   Bee Venom Anaphylaxis   Hydrocodone Bit-Homatrop Mbr Other (See Comments)    Hallucinations, confusion, delirium Depressed feeling   Toradol [Ketorolac Tromethamine] Other (See Comments)    Hallucinations, confusion, delirium   Prednisone     Patient reports it causes cirrhosis to flare up   Sulfadiazine     NDC ZOXW:96045409811 NDC BJYN:[id number] NDC VHQI:[id number]   Morphine And Related Other (See Comments)    Hallucinations, back in the 80s. States has taken vicodin before w/o problems    Sulfa Drugs Cross Reactors Rash    Past Medical History:  Diagnosis Date   Abnormal cardiac CT angiography    Acid reflux    Annual physical exam 04/08/2015   Arthritis    Atypical chest pain 06/13/2020   Blood transfusion without reported diagnosis    Body mass index (BMI) 35.0-35.9, adult 04/05/2019   Chronic fatigue 01/28/2015   Chronic headaches    on cymbalta   Chronic migraine w/o aura, not intractable, w/o stat migr 10/24/2018   Cirrhosis  Colon polyps    Complication of anesthesia    problems waking up from anesthesia   Coronary artery disease 01/23/2019   Depression    on cymbalta   Diabetes mellitus with neuropathy    Diabetes with neuropathy 04/25/2013   Diverticulitis 03/2013   Dyslipidemia 05/29/2019   Eczema    Elevated LFTs    Epidural lipomatosis 10/05/2018   Essential (primary) hypertension 04/05/2019   Essential hypertension 10/10/2019   Fatty liver    GERD (gastroesophageal reflux  disease) 04/28/2011   H/O craniotomy 05/07/2015   Headache 04/28/2011   Hepatitis 10/2017   NASH cirrhosis   History of kidney stones    Hyperlipidemia    Hypersomnia with sleep apnea 01/28/2015   Hypertension    IDA (iron deficiency anemia) 01/24/2019   Idiopathic intracranial hypertension 01/14/2017   Insomnia 04/26/2013   Kidney stone    Liver cirrhosis secondary to NASH (nonalcoholic steatohepatitis) 01/02/2016   Low back pain 04/05/2019   Lower back injury 08/14/2019   Morbid obesity    Neuromuscular disorder    neuropathy   Neuropathy    Nonalcoholic steatohepatitis 10/05/2018   Obstructive hydrocephalus 01/28/2015   OSA -- dx ~ 2012, cpap intolerant 09/04/2014    dx ~ 2012, cpap intolerant    PCP NOTES >>> 04/08/2015   Post-op pain 03/19/2019   Post-traumatic hydrocephalus    s/p shunts x 2 (first got infected )   Presence of cerebrospinal fluid drainage device 07/28/2011   Psoriasis    sees Dr Lenis NoonGrueber   Psoriatic arthritis    REM behavioral disorder 01/14/2017   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   see's Dr.Ortmann   Severe obesity (BMI >= 40) 01/28/2015   SI (sacroiliac) joint dysfunction 08/14/2019   Sigmoid diverticulitis 04/25/2013   Sleep apnea    no CPAP      Spondylolisthesis, lumbar region 03/16/2019   Stomach ulcer    Testosterone deficiency 04/28/2011   VP (ventriculoperitoneal) shunt status 07/31/2020    Past Surgical History:  Procedure Laterality Date   AMPUTATION Left 12/27/2021   Procedure: AMPUTATION GREAT TOE;  Surgeon: Vivi BarrackWagoner, Matthew R, DPM;  Location: [facility] OR;  Service: Podiatry;  Laterality: Left;   BACK SURGERY  1980   BRAIN SURGERY     VP shunts placed in 2007   CHOLECYSTECTOMY N/A 08/25/2017   Procedure: LAPAROSCOPIC CHOLECYSTECTOMY WITH INTRAOPERATIVE CHOLANGIOGRAM;  Surgeon: Griselda Mineroth, Paul III, MD;  [facility];  Service: General;  Laterality: N/A;   COLONOSCOPY     CORONARY STENT INTERVENTION N/A 09/27/2020   Procedure: CORONARY STENT  INTERVENTION;  Surgeon: Corky CraftsVaranasi, Jayadeep S, MD;  Location: [facility] INVASIVE CV LAB;  Service: Cardiovascular;  Laterality: N/A;   CORONARY ULTRASOUND/IVUS N/A 09/27/2020   Procedure: Intravascular Ultrasound/IVUS;  Surgeon: Corky CraftsVaranasi, Jayadeep S, MD;  [facility];  Service: Cardiovascular;  Laterality: N/A;   JOINT REPLACEMENT     total hip   LEFT HEART CATH N/A 09/27/2020   Procedure: Left Heart Cath;  Surgeon: Corky CraftsVaranasi, Jayadeep S, MD;  [facility];  Service: Cardiovascular;  Laterality: N/A;   LEFT HEART CATH AND CORONARY ANGIOGRAPHY N/A 09/24/2020   Procedure: LEFT HEART CATH AND CORONARY ANGIOGRAPHY;  Surgeon: Corky CraftsVaranasi, Jayadeep S, MD;  [facility];  Service: Cardiovascular;  Laterality: N/A;   LEFT HEART CATH AND CORONARY ANGIOGRAPHY N/A 08/18/2021   Procedure: LEFT HEART CATH AND CORONARY ANGIOGRAPHY;  Surgeon: Lennette BihariKelly, Thomas A, MD;  [facility];  Service: Cardiovascular;  Laterality: N/A;   LUMBAR FUSION  03/16/2019   SHOULDER SURGERY Left 2010   TEE WITHOUT CARDIOVERSION N/A 01/01/2022   Procedure: TRANSESOPHAGEAL ECHOCARDIOGRAM (TEE);  Surgeon: Thomasene Ripple, DO;  Location: [facility] ENDOSCOPY;  Service: Cardiovascular;  Laterality: N/A;   TOE SURGERY Left 2018   TONSILLECTOMY     as a child   TOTAL HIP ARTHROPLASTY Left 2011   UPPER GASTROINTESTINAL ENDOSCOPY  01/04/2020   VENTRICULOPERITONEAL SHUNT  2007   x2    Family History  Problem Relation Age of Onset   Other Mother    Lung cancer Father        alive, former smoker    Heart disease Brother        MI age 16   Other Brother        Murdered   Down syndrome Son    Diabetes Neg Hx    Prostate cancer Neg Hx    Colon cancer Neg Hx    Stomach cancer Neg Hx    Pancreatic cancer Neg Hx    Liver disease Neg Hx     Social History:  reports that he has never smoked. He has never used smokeless tobacco. He reports that he does not drink alcohol and does not use drugs.     Review of Systems     The following is a copy of the previous note  HYPERTENSION: On carvedilol with good control Hypertension managed by his PCP   BP Readings from Last 3 Encounters:  10/07/22 130/84  09/09/22 136/84  07/23/22 122/66   RENAL dysfunction: Has had mildly increased creatinine levels, again stable No history of microalbuminuria  Lab Results  Component Value Date   CREATININE 1.55 (H) 09/01/2022   CREATININE 1.6 (A) 07/06/2022   CREATININE 1.64 (H) 05/11/2022    Lipid history: He is on Lipitor 80 mg from his PCP  Is on fenofibrate 3 times a week for high triglycerides,  LDL is below 70 and triglycerides are normal    Lab Results  Component Value Date   CHOL 137 01/30/2022   CHOL 118 08/16/2021   CHOL 139 03/04/2021   Lab Results  Component Value Date   HDL 42.10 01/30/2022   HDL 43 08/16/2021   HDL 39.90 03/04/2021   Lab Results  Component Value Date   LDLCALC 68 01/30/2022   LDLCALC 55 08/16/2021   LDLCALC 69 03/04/2021   Lab Results  Component Value Date   TRIG 137.0 01/30/2022   TRIG 101 08/16/2021   TRIG 151.0 (H) 03/04/2021   Lab Results  Component Value Date   CHOLHDL 3 01/30/2022   CHOLHDL 2.7 08/16/2021   CHOLHDL 3 03/04/2021   Lab Results  Component Value Date   LDLDIRECT 67.0 12/31/2020   LDLDIRECT 91.0 06/11/2020   LDLDIRECT 83.0 10/09/2019            He is being followed by gastroenterology for liver cirrhosis secondary to [facility]  Lab Results  Component Value Date   ALT 19 07/06/2022     He has symptomatic painful neuropathy and is on gabapentin 600 mg twice a day and  Cymbalta 60 mg  Has been followed by gastroenterologist for NASH  Lab Results  Component Value Date   ALT 19 07/06/2022     Review of Systems    Physical Examination:  BP 130/84 (BP Location: Left Arm, Patient Position: Sitting, Cuff Size: Small)   Pulse 70   Ht 5\' 11"  (1.803  m)   Wt 254 lb (115.2 kg)   SpO2 95%   BMI 35.43 kg/m        ASSESSMENT:  Diabetes type 2 with obesity  See history of present illness for discussion of current diabetes management, blood sugar patterns and problems identified  Currently on a regimen of AMARYL and Metformin 2000 mg a day  A1c is last higher at 7.7  He did not take the Victoza along with his oral medications Also as above he did not start the OmniPod insulin pump as directed  Blood sugars are higher especially since yesterday when he apparently had an epidural injection, likely with steroids   PLAN:  Prescription sent for the Dexcom sensors, Humalog insulin and insulin syringes He was referred to the diabetes educator to set up an appointment to start the pump next week In the meantime he will start insulin injections using a syringe with the following regimen  HUMALOG: Take 10 units before lunch and 15 at supper and extra 5 for sugar >250  Tresiba 1x daily 20 units daily and reduce to 12 when am sugar <100 Sample of Tresiba given   Patient Instructions  HUMALOG: Take 10 units before lunch and 15 at supper and extra 5 for sugar >250  Tresiba 1x daily 20 units daily and reduce to 12 when am sugar <100       Reather Littler 10/07/2022, 3:30 PM

## 2022-10-07 NOTE — Patient Instructions (Signed)
HUMALOG: Take 10 units before lunch and 15 at supper and extra 5 for sugar >250  Tresiba 1x daily 20 units daily and reduce to 12 when am sugar <100

## 2022-10-08 ENCOUNTER — Other Ambulatory Visit (HOSPITAL_BASED_OUTPATIENT_CLINIC_OR_DEPARTMENT_OTHER): Payer: Self-pay

## 2022-10-08 ENCOUNTER — Telehealth: Payer: Self-pay

## 2022-10-08 NOTE — Telephone Encounter (Signed)
Pharmacy called regarding PA for Dexom submitted for review.

## 2022-10-12 ENCOUNTER — Ambulatory Visit (INDEPENDENT_AMBULATORY_CARE_PROVIDER_SITE_OTHER): Payer: 59 | Admitting: Neurology

## 2022-10-12 ENCOUNTER — Other Ambulatory Visit: Payer: Self-pay

## 2022-10-12 ENCOUNTER — Other Ambulatory Visit (HOSPITAL_BASED_OUTPATIENT_CLINIC_OR_DEPARTMENT_OTHER): Payer: Self-pay

## 2022-10-12 ENCOUNTER — Telehealth: Payer: Self-pay

## 2022-10-12 ENCOUNTER — Other Ambulatory Visit (HOSPITAL_COMMUNITY): Payer: Self-pay

## 2022-10-12 ENCOUNTER — Encounter: Payer: Self-pay | Admitting: Neurology

## 2022-10-12 ENCOUNTER — Encounter: Payer: 59 | Attending: Endocrinology | Admitting: Nutrition

## 2022-10-12 VITALS — BP 141/89 | HR 86 | Ht 71.0 in | Wt 251.5 lb

## 2022-10-12 DIAGNOSIS — G4752 REM sleep behavior disorder: Secondary | ICD-10-CM

## 2022-10-12 DIAGNOSIS — Z982 Presence of cerebrospinal fluid drainage device: Secondary | ICD-10-CM | POA: Diagnosis not present

## 2022-10-12 DIAGNOSIS — M542 Cervicalgia: Secondary | ICD-10-CM | POA: Diagnosis not present

## 2022-10-12 DIAGNOSIS — G43709 Chronic migraine without aura, not intractable, without status migrainosus: Secondary | ICD-10-CM

## 2022-10-12 DIAGNOSIS — G4733 Obstructive sleep apnea (adult) (pediatric): Secondary | ICD-10-CM | POA: Diagnosis not present

## 2022-10-12 DIAGNOSIS — E1165 Type 2 diabetes mellitus with hyperglycemia: Secondary | ICD-10-CM

## 2022-10-12 MED ORDER — ONABOTULINUMTOXINA 200 UNITS IJ SOLR
INTRAMUSCULAR | 2 refills | Status: DC
Start: 2022-10-12 — End: 2023-08-24
  Filled 2022-10-12: qty 1, fill #0
  Filled 2022-10-13: qty 1, 84d supply, fill #0
  Filled 2022-12-30 – 2023-03-16 (×3): qty 1, 84d supply, fill #1
  Filled 2023-06-15: qty 1, 84d supply, fill #2

## 2022-10-12 MED ORDER — CLONAZEPAM 0.5 MG PO TABS
0.5000 mg | ORAL_TABLET | Freq: Every day | ORAL | 5 refills | Status: DC
  Filled 2022-10-12 – 2022-10-29 (×2): qty 30, 30d supply, fill #0
  Filled 2022-11-30: qty 30, 30d supply, fill #1
  Filled 2022-12-29: qty 30, 30d supply, fill #2
  Filled 2023-01-28: qty 30, 30d supply, fill #3
  Filled 2023-02-25: qty 30, 30d supply, fill #4
  Filled 2023-03-24 – 2023-03-26 (×2): qty 30, 30d supply, fill #5

## 2022-10-12 NOTE — Telephone Encounter (Signed)
Patient Advocate Encounter   Received notification from pt msgs that prior authorization is required for Dexcom G6 sensor and transmitter  Submitted: 10/12/22 Key [id number] (Sensor) Key [id number] (transmitter)  Status is pending for both

## 2022-10-12 NOTE — Progress Notes (Signed)
[facility]  PATIENT: FAROUK VIVERO DOB: 1956/04/20  REFERRING DOCTOR OR PCP:  Willow Ora  SOURCE: patient, notes from Dr. Drue Novel, imaging results and MRI and CT scans on PACS personally reviewed  _________________________________   HISTORICAL  CHIEF COMPLAINT:  Chief Complaint  Patient presents with   Room 10    Pt is here Alone. Pt states that he hasn't had any blood thinners today.     HISTORY OF PRESENT ILLNESS:  Safir Michalec is a 67 y.o. man with idiopathic intracranial hypertension and recent increase in the frequency and severity of headache  Update 10/12/2022:  He reports that have done fairly well with the Botox therapy.  As with other cycles, he has had more headaches the last couple weeks of the cycle compared to the first 2 and half months.  Before Botox he was having daily headaches that were more severe.  Neck pain is also doing better.    His migraine pain is usually frontal though there is some occipital pain as well.  Pain is usually symmetric though at times the right will be worse.  When the headaches more severe he has photophobia and phonophobia. Sometimes, headaches improve when he is laying down but other times they do not change between sitting and lying down. Moving usually worsens the headaches.  He did not get a benefit from multiple prophylactic medications including Keppra, gabapentin, Emgality, Cymbalta.    Besides the chronic migraine, he has obstructive sleep apnea but was unable to tolerate CPAP.  We had discussed the inspire device but he is not interested at this time..  We also discussed that he should try to lose some more weight.     His REM behavior disorder has done well on nighttime clonazepam.  No other signs of a synucleinopathy.  In early 2022, he had 3 cardiac stents after being diagnosed with CAD after presenting with chest pain.   He had a small MI.    He is also on Humira for psoriatis.  He has Elita Boone related  cirrhosis.  He has a VP shunt for idiopathic intracranial hypertension, since 2017.  Chronic Migraine History: He was having daily HA with migrainous features.   He did not get a benefit from multiple prophylactic medications including Keppra, gabapentin, Emgality, Cymbalta.   Botox therapy started July 2021.  Spine: L-spine MRI 08/15/18 showed progressive epidural lipomatosis in the lower lumbar spine with progressive compression of the thecal sac and spinal stenosis at L3-4, L4-5, and L5-S1 compared to the prior study. Progressive facet degeneration and anterolisthesis at L4-5 since 2011.    MRI of the cervical spine performed 04/27/2017 showed  thoracic fusion hardware from C7 and below. There is mild spondylosis and disc bulging at C3-C4 through C6-C7. There does not appear to be any significant foraminal narrowing and there is no spinal stenosis.   Idiopthic intracranial Hypertension: He was diagnosed with idiopathic intracranial hypertension many years ago and had a VP shunt placed in 2007. A revision was performed July 2007 due to infection. He has a programmable Medtronic valve. Due to the increased headaches, it was reprogrammed from 1.5-1.0. Tapping of the shunt showed a pressure 160 (was drained to 140 mm). There was no infection. He also had an MRI of the brain and MR venogram. CT had shown the left-sided ventricular catheter extends into the right caudate head, similar to the previous study the MR venogram (11/12/2016) was reportedly normal. The left transverse and sigmoid sinuses are not  well evaluated due to the shunt reservoir (but also likely he is right dominant).   CT head 3/13 2020 personally reviewed and no evidence of shunt failure        REVIEW OF SYSTEMS: Constitutional: No fevers, chills, sweats, or change in appetite Eyes: No visual changes, double vision, eye pain Ear, nose and throat: No hearing loss, ear pain, nasal congestion, sore throat Cardiovascular: No chest  pain, palpitations Respiratory:  No shortness of breath at rest or with exertion.   No wheezes GastrointestinaI: No nausea, vomiting, diarrhea, abdominal pain, fecal incontinence Genitourinary:  No dysuria, urinary retention or frequency.  No nocturia. Musculoskeletal:  as above Integumentary: psoriasis on Humira Neurological: as above Psychiatric: No depression at this time.  No anxiety Endocrine: No palpitations, diaphoresis, change in appetite, change in weigh or increased thirst Hematologic/Lymphatic:  No anemia, purpura, petechiae. Allergic/Immunologic: No itchy/runny eyes, nasal congestion, recent allergic reactions, rashes  ALLERGIES: Allergies  Allergen Reactions   Bee Venom Anaphylaxis   Hydrocodone Bit-Homatrop Mbr Other (See Comments)    Hallucinations, confusion, delirium Depressed feeling   Toradol [Ketorolac Tromethamine] Other (See Comments)    Hallucinations, confusion, delirium   Prednisone     Patient reports it causes cirrhosis to flare up   Sulfadiazine     NDC RAXE:[id number] NDC SRPR:[id number] NDC QKMM:[id number]   Morphine And Related Other (See Comments)    Hallucinations, back in the 80s. States has taken vicodin before w/o problems    Sulfa Drugs Cross Reactors Rash    HOME MEDICATIONS:  Current Outpatient Medications:    acetaminophen (TYLENOL) 325 MG tablet, Take 2 tablets (650 mg total) by mouth every 4 (four) hours as needed for headache or mild pain., Disp: , Rfl:    Adalimumab (HUMIRA, 2 PEN,) 40 MG/0.8ML PNKT, Inject 40 mg (0.8 ml) under the skin every other week (Patient taking differently: Inject 40 mg into the skin every 14 (fourteen) days.), Disp: 2 each, Rfl: 4   amLODipine (NORVASC) 5 MG tablet, Take 1 tablet (5 mg total) by mouth daily., Disp: 90 tablet, Rfl: 3   aspirin 81 MG chewable tablet, Chew 1 tablet (81 mg total) by mouth daily., Disp: , Rfl:    atorvastatin (LIPITOR) 80 MG tablet, Take 1 tablet (80 mg total) by mouth at  bedtime., Disp: 90 tablet, Rfl: 1   carvedilol (COREG) 12.5 MG tablet, Take 1 tablet (12.5 mg total) by mouth 2 (two) times daily with a meal., Disp: 180 tablet, Rfl: 0   clobetasol (TEMOVATE) 0.05 % external solution, Apply 1 application externally twice a day 14 days., Disp: 50 mL, Rfl: 1   clopidogrel (PLAVIX) 75 MG tablet, TAKE 1 TABLET (75 MG TOTAL) BY MOUTH DAILY., Disp: 90 tablet, Rfl: 3   Continuous Blood Gluc Sensor (DEXCOM G6 SENSOR) MISC, Use to monitor blood sugar, change after 10 days, Disp: 3 each, Rfl: 3   DULoxetine (CYMBALTA) 60 MG capsule, Take 2 capsules (120 mg total) by mouth daily., Disp: 180 capsule, Rfl: 1   EPINEPHrine (EPIPEN 2-PAK) 0.3 mg/0.3 mL IJ SOAJ injection, Inject 0.3 mLs (0.3 mg total) into the muscle once as needed for up to 1 dose for anaphylaxis., Disp: 2 each, Rfl: 2   fenofibrate micronized (LOFIBRA) 134 MG capsule, Take 1 capsule (134 mg total) by mouth 3 (three) times a week., Disp: 15 capsule, Rfl: 3   gabapentin (NEURONTIN) 600 MG tablet, Take 1 tablet (600 mg total) by mouth 2 (two) times daily., Disp: 180 tablet, Rfl: 1  glimepiride (AMARYL) 4 MG tablet, Take 1 tablet (4 mg total) by mouth daily before breakfast., Disp: 30 tablet, Rfl: 3   Insulin Disposable Pump (OMNIPOD 5 G6 PODS, GEN 5,) MISC, 1 Device by Does not apply route every 3 (three) days., Disp: 10 each, Rfl: 3   insulin lispro (HUMALOG) 100 UNIT/ML injection, Use up to 60 Units daily in pump, Disp: 20 mL, Rfl: 2   Insulin Syringe-Needle U-100 (INSULIN SYRINGE 1CC/31GX5/16") 31G X 5/16" 1 ML MISC, Use to administer Humalog 3 times a day, Disp: 100 each, Rfl: 0   isosorbide mononitrate (IMDUR) 120 MG 24 hr tablet, Take 1 tablet (120 mg total) by mouth daily., Disp: 90 tablet, Rfl: 3   liraglutide (VICTOZA) 18 MG/3ML SOPN, Start with 0.6mg  subcutaneously once a day for 7 days, then increase to 1.2mg  once a day, Disp: 6 mL, Rfl: 2   metFORMIN (GLUCOPHAGE) 500 MG tablet, Take 2 tablets (1,000 mg  total) by mouth 2 (two) times daily., Disp: 120 tablet, Rfl: 0   nitroGLYCERIN (NITROSTAT) 0.4 MG SL tablet, Place 1 tablet (0.4 mg total) under the tongue every 5 (five) minutes for 3 doses as needed for chest pain., Disp: 25 tablet, Rfl: 6   pantoprazole (PROTONIX) 40 MG tablet, Take 1 tablet (40 mg total) by mouth daily., Disp: 90 tablet, Rfl: 1   botulinum toxin Type A (BOTOX) 200 units injection, Provider to inject 155 units into the muscles of the head and neck every 3 months. Discard remainder., Disp: 1 each, Rfl: 2   clonazePAM (KLONOPIN) 0.5 MG tablet, Take 1 tablet (0.5 mg total) by mouth at bedtime., Disp: 30 tablet, Rfl: 5  PAST MEDICAL HISTORY: Past Medical History:  Diagnosis Date   Abnormal cardiac CT angiography    Acid reflux    Annual physical exam 04/08/2015   Arthritis    Atypical chest pain 06/13/2020   Blood transfusion without reported diagnosis    Body mass index (BMI) 35.0-35.9, adult 04/05/2019   Chronic fatigue 01/28/2015   Chronic headaches    on cymbalta   Chronic migraine w/o aura, not intractable, w/o stat migr 10/24/2018   Cirrhosis    Colon polyps    Complication of anesthesia    problems waking up from anesthesia   Coronary artery disease 01/23/2019   Depression    on cymbalta   Diabetes mellitus with neuropathy    Diabetes with neuropathy 04/25/2013   Diverticulitis 03/2013   Dyslipidemia 05/29/2019   Eczema    Elevated LFTs    Epidural lipomatosis 10/05/2018   Essential (primary) hypertension 04/05/2019   Essential hypertension 10/10/2019   Fatty liver    GERD (gastroesophageal reflux disease) 04/28/2011   H/O craniotomy 05/07/2015   Headache 04/28/2011   Hepatitis 10/2017   NASH cirrhosis   History of kidney stones    Hyperlipidemia    Hypersomnia with sleep apnea 01/28/2015   Hypertension    IDA (iron deficiency anemia) 01/24/2019   Idiopathic intracranial hypertension 01/14/2017   Insomnia 04/26/2013   Kidney stone    Liver cirrhosis  secondary to NASH (nonalcoholic steatohepatitis) 01/02/2016   Low back pain 04/05/2019   Lower back injury 08/14/2019   Morbid obesity    Neuromuscular disorder    neuropathy   Neuropathy    Nonalcoholic steatohepatitis 10/05/2018   Obstructive hydrocephalus 01/28/2015   OSA -- dx ~ 2012, cpap intolerant 09/04/2014    dx ~ 2012, cpap intolerant    PCP NOTES >>> 04/08/2015   Post-op pain  03/19/2019   Post-traumatic hydrocephalus    s/p shunts x 2 (first got infected )   Presence of cerebrospinal fluid drainage device 07/28/2011   Psoriasis    sees Dr Lenis Noon   Psoriatic arthritis    REM behavioral disorder 01/14/2017   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   see's Dr.Ortmann   Severe obesity (BMI >= 40) 01/28/2015   SI (sacroiliac) joint dysfunction 08/14/2019   Sigmoid diverticulitis 04/25/2013   Sleep apnea    no CPAP      Spondylolisthesis, lumbar region 03/16/2019   Stomach ulcer    Testosterone deficiency 04/28/2011   VP (ventriculoperitoneal) shunt status 07/31/2020    PAST SURGICAL HISTORY: Past Surgical History:  Procedure Laterality Date   AMPUTATION Left 12/27/2021   Procedure: AMPUTATION GREAT TOE;  Surgeon: Vivi Barrack, DPM;  Location: [facility] OR;  Service: Podiatry;  Laterality: Left;   BACK SURGERY  1980   BRAIN SURGERY     VP shunts placed in 2007   CHOLECYSTECTOMY N/A 08/25/2017   Procedure: LAPAROSCOPIC CHOLECYSTECTOMY WITH INTRAOPERATIVE CHOLANGIOGRAM;  Surgeon: Griselda Miner, MD;  Location[facility] OR;  Service: General;  Laterality: N/A;   COLONOSCOPY     CORONARY STENT INTERVENTION N/A 09/27/2020   Procedure: CORONARY STENT INTERVENTION;  Surgeon: Corky Crafts, MD;  Location: [facility] INVASIVE CV LAB;  Service: Cardiovascular;  Laterality: N/A;   CORONARY ULTRASOUND/IVUS N/A 09/27/2020   Procedure: Intravascular Ultrasound/IVUS;  Surgeon: Corky Crafts, MD;  Location: [facility] INVASIVE CV LAB;  Service: Cardiovascular;  Laterality: N/A;   JOINT REPLACEMENT      total hip   LEFT HEART CATH N/A 09/27/2020   Procedure: Left Heart Cath;  Surgeon: Corky Crafts, MD;  Location: [facility] INVASIVE CV LAB;  Service: Cardiovascular;  Laterality: N/A;   LEFT HEART CATH AND CORONARY ANGIOGRAPHY N/A 09/24/2020   Procedure: LEFT HEART CATH AND CORONARY ANGIOGRAPHY;  Surgeon: Corky Crafts, MD;  Location: [facility] INVASIVE CV LAB;  Service: Cardiovascular;  Laterality: N/A;   LEFT HEART CATH AND CORONARY ANGIOGRAPHY N/A 08/18/2021   Procedure: LEFT HEART CATH AND CORONARY ANGIOGRAPHY;  Surgeon: Lennette Bihari, MD;  Location: [facility] INVASIVE CV LAB;  Service: Cardiovascular;  Laterality: N/A;   LUMBAR FUSION  03/16/2019   SHOULDER SURGERY Left 2010   TEE WITHOUT CARDIOVERSION N/A 01/01/2022   Procedure: TRANSESOPHAGEAL ECHOCARDIOGRAM (TEE);  Surgeon: Thomasene Ripple, DO;  Location: [facility] ENDOSCOPY;  Service: Cardiovascular;  Laterality: N/A;   TOE SURGERY Left 2018   TONSILLECTOMY     as a child   TOTAL HIP ARTHROPLASTY Left 2011   UPPER GASTROINTESTINAL ENDOSCOPY  01/04/2020   VENTRICULOPERITONEAL SHUNT  2007   x2    FAMILY HISTORY: Family History  Problem Relation Age of Onset   Other Mother    Lung cancer Father        alive, former smoker    Heart disease Brother        MI age 27   Other Brother        Murdered   Down syndrome Son    Diabetes Neg Hx    Prostate cancer Neg Hx    Colon cancer Neg Hx    Stomach cancer Neg Hx    Pancreatic cancer Neg Hx    Liver disease Neg Hx     SOCIAL HISTORY:  Social History   Socioeconomic History   Marital status: Married    Spouse name: Burna Mortimer   Number of children: 2  Years of education: Not on file   Highest education level: Not on file  Occupational History   Occupation: disabled   Tobacco Use   Smoking status: Never   Smokeless tobacco: Never  Vaping Use   Vaping Use: Never used  Substance and Sexual Activity   Alcohol use: No   Drug use: No   Sexual activity: Yes    Partners: Female  Other  Topics Concern   Not on file  Social History Narrative   Household-- pt , wife, one adult son with Down's syndrome   younger son lives in [place]   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   Social Determinants of Health   Financial Resource Strain: Low Risk  (03/18/2021)   Overall Financial Resource Strain (CARDIA)    Difficulty of Paying Living Expenses: Not hard at all  Food Insecurity: No Food Insecurity (01/22/2022)   Hunger Vital Sign    Worried About Running Out of Food in the Last Year: Never true    Ran Out of Food in the Last Year: Never true  Transportation Needs: No Transportation Needs (01/22/2022)   PRAPARE - Administrator, Civil Service (Medical): No    Lack of Transportation (Non-Medical): No  Physical Activity: Inactive (03/18/2021)   Exercise Vital Sign    Days of Exercise per Week: 0 days    Minutes of Exercise per Session: 0 min  Stress: No Stress Concern Present (03/18/2021)   [employer] of Occupational Health - Occupational Stress Questionnaire    Feeling of Stress : Not at all  Social Connections: Moderately Integrated (03/18/2021)   Social Connection and Isolation Panel [NHANES]    Frequency of Communication with Friends and Family: More than three times a week    Frequency of Social Gatherings with Friends and Family: Once a week    Attends Religious Services: Never    Database administrator or Organizations: Yes    Attends Banker Meetings: 1 to 4 times per year    Marital Status: Married  Catering manager Violence: Not At Risk (01/22/2022)   Humiliation, Afraid, Rape, and Kick questionnaire    Fear of Current or Ex-Partner: No    Emotionally Abused: No    Physically Abused: No    Sexually Abused: No     PHYSICAL EXAM Today's Vitals   10/12/22 1348  BP: (!) 141/89  Pulse: 86  Weight: 251 lb 8 oz (114.1 kg)  Height: 5\' 11"  (1.803 m)    Body mass index is 35.08 kg/m.      General: The patient is well-developed and well-nourished and in no acute distress, he has a VP shunt bulb on the left   Neck: He has mild tenderness at the occiput.  Good range of motion..  No weakness.   Skin: Extremities are without significant edema.  Psoriasis is well controlled.  Neurologic Exam  Mental status: The patient is alert and oriented x 3 at the time of the examination.  No ptosis.  The patient has apparent normal recent and remote memory, with an apparently normal attention span and concentration ability.   Speech is normal.  Cranial nerves: Extraocular movements are full.  Facial strength and sensation is normal.  Neck strength is normal.  Hearing is normal.  Motor: 5/5 strength  Coordination: Cerebellar testing shows good finger-nose-finger  Gait and station: Station is normal.  The gait is arthritic.  Tandem gait is wide.  Romberg is  negative.     DIAGNOSTIC DATA (LABS, IMAGING, TESTING) - I reviewed patient records, labs, notes, testing and imaging myself where available.  Lab Results  Component Value Date   WBC 5.2 07/23/2022   HGB 13.3 07/23/2022   HCT 39.1 07/23/2022   MCV 89.6 07/23/2022   PLT 143.0 (L) 07/23/2022      Component Value Date/Time   NA 139 09/01/2022 1107   NA 143 07/06/2022 0000   K 4.4 09/01/2022 1107   CL 106 09/01/2022 1107   CO2 21 09/01/2022 1107   GLUCOSE 184 (H) 09/01/2022 1107   BUN 24 (H) 09/01/2022 1107   BUN 21 07/06/2022 0000   CREATININE 1.55 (H) 09/01/2022 1107   CALCIUM 9.4 09/01/2022 1107   PROT 7.7 01/30/2022 1007   ALBUMIN 4.4 07/06/2022 0000   AST 23 07/06/2022 0000   ALT 19 07/06/2022 0000   ALKPHOS 78 07/06/2022 0000   BILITOT 1.5 (H) 01/30/2022 1007   GFRNONAA 52 (L) 01/01/2022 0315   GFRAA 66 08/08/2020 1442   Lab Results  Component Value Date   CHOL 137 01/30/2022   HDL 42.10 01/30/2022   LDLCALC 68 01/30/2022   LDLDIRECT 67.0 12/31/2020   TRIG 137.0 01/30/2022   CHOLHDL 3 01/30/2022    Lab Results  Component Value Date   HGBA1C 7.7 (H) 09/01/2022   Lab Results  Component Value Date   VITAMINB12 490 06/10/2018   Lab Results  Component Value Date   TSH 1.22 06/26/2021       ASSESSMENT AND PLAN  Neck pain  Chronic migraine w/o aura, not intractable, w/o stat migr - Plan: botulinum toxin Type A (BOTOX) 200 units injection  OSA -- dx ~ 2012, cpap intolerant  Presence of cerebrospinal fluid drainage device  REM behavioral disorder  1.  Botox 155 units as follows: Frontalis muscle (5 units x 4), corrugators and procerus (5 units x 3), temporalis (5 units x 8), occipitalis (5 units x 4), splenius capitis (12.5 units x 2), splenius cervicis (10 units x 2), C6-C7 paraspinal (5 units x 2), trapezius (12.5 units x 2).  45 units wasted.   2.    He was unable to tolerate CPAP.  We have discussed the inspire device but he is not interested at this time. 3.   Continue clonazepam 0.5 mg for RBD and insomnia 4.   rtc 6 months, sooner if new or worsening issues.  Jahyra Sukup A. Epimenio Foot, MD, [facility] 10/12/2022, 3:45 PM Certified in Neurology, Clinical Neurophysiology, Sleep Medicine, Pain Medicine and Neuroimaging  [facility] [address], [address] [place], [place] [address] [phone]

## 2022-10-12 NOTE — Progress Notes (Signed)
Patient does not have the G6 sensors.  Says he has a 3 month supply of Libres he is working through, and they will not send his Dexcoms.  I explained that his pump will not work in the automated mode unless he is using the Dexcom G6 sensors.  He will call the pharmacy to see if he can exchange them and get the G6 sensors to be able to start his pump. He says he is taking the [place], but not the Humalog, Blood sugars are all over 300.  He has uped his dose to 25u.  Suggestions given to increase dose of Tresiba by 2u q 3 days until FBS come down below 180.  He agreed to do this. Strongly suggested he take the Humalog ac meals per Dr. Remus Blake last office note.  Written instructions give for these doses for lunch and supper.  He said he will go by the pharmacy and see if they will take back the sensors and and give him the Dexcom sensors. He will call me if this is the case, and he will keep his appointment with me tomorrow to start his pump.  We set up his Dexcom G6 app on his phone, as well as his Clarity app and linked it to this practice.  We also set up his [employer] account, but were not able to link him, due to his PDM not being set up as yet.

## 2022-10-12 NOTE — Telephone Encounter (Signed)
Pharmacy Patient Advocate Encounter  Prior Authorization for [facility] G6 transmitter and sensor has been approved by [employer] (ins).    Effective dates: 10/12/22 through 10/12/23

## 2022-10-12 NOTE — Telephone Encounter (Signed)
Pt.notified

## 2022-10-13 ENCOUNTER — Other Ambulatory Visit (HOSPITAL_COMMUNITY): Payer: Self-pay

## 2022-10-13 ENCOUNTER — Other Ambulatory Visit: Payer: Self-pay

## 2022-10-13 ENCOUNTER — Other Ambulatory Visit (HOSPITAL_BASED_OUTPATIENT_CLINIC_OR_DEPARTMENT_OTHER): Payer: Self-pay

## 2022-10-14 ENCOUNTER — Other Ambulatory Visit (HOSPITAL_COMMUNITY): Payer: Self-pay

## 2022-10-14 ENCOUNTER — Ambulatory Visit: Payer: 59 | Admitting: Nutrition

## 2022-10-15 ENCOUNTER — Other Ambulatory Visit (HOSPITAL_COMMUNITY): Payer: Self-pay

## 2022-10-15 ENCOUNTER — Other Ambulatory Visit: Payer: Self-pay

## 2022-10-16 ENCOUNTER — Other Ambulatory Visit: Payer: Self-pay

## 2022-10-16 ENCOUNTER — Encounter: Payer: Self-pay | Admitting: Cardiology

## 2022-10-16 ENCOUNTER — Ambulatory Visit: Payer: 59 | Attending: Cardiology | Admitting: Cardiology

## 2022-10-16 VITALS — BP 130/80 | HR 83 | Ht 71.0 in | Wt 250.0 lb

## 2022-10-16 DIAGNOSIS — I2510 Atherosclerotic heart disease of native coronary artery without angina pectoris: Secondary | ICD-10-CM

## 2022-10-16 DIAGNOSIS — I10 Essential (primary) hypertension: Secondary | ICD-10-CM | POA: Diagnosis not present

## 2022-10-16 DIAGNOSIS — G4733 Obstructive sleep apnea (adult) (pediatric): Secondary | ICD-10-CM

## 2022-10-16 NOTE — Patient Instructions (Signed)
Medication Instructions:  Your physician recommends that you continue on your current medications as directed. Please refer to the Current Medication list given to you today.  *If you need a refill on your cardiac medications before your next appointment, please call your pharmacy*   Lab Work: None Ordered If you have labs (blood work) drawn today and your tests are completely normal, you will receive your results only by: MyChart Message (if you have MyChart) OR A paper copy in the mail If you have any lab test that is abnormal or we need to change your treatment, we will call you to review the results.   Testing/Procedures: None Ordered   Follow-Up: At [facility], you and your health needs are our priority.  As part of our continuing mission to provide you with exceptional heart care, we have created designated Provider Care Teams.  These Care Teams include your primary Cardiologist (physician) and Advanced Practice Providers (APPs -  Physician Assistants and Nurse Practitioners) who all work together to provide you with the care you need, when you need it.  We recommend signing up for the patient portal called "MyChart".  Sign up information is provided on this After Visit Summary.  MyChart is used to connect with patients for Virtual Visits (Telemedicine).  Patients are able to view lab/test results, encounter notes, upcoming appointments, etc.  Non-urgent messages can be sent to your provider as well.   To learn more about what you can do with MyChart, go to [email or web address].    Your next appointment:   6 month(s)  The format for your next appointment:   In Person  Provider:   Robert Krasowski, MD    Other Instructions NA  

## 2022-10-16 NOTE — Progress Notes (Signed)
Cardiology Office Note:    Date:  10/16/2022   ID:  James [name], DOB 01/20/1956, MRN [id number]  PCP:  Wanda Plump, MD  Cardiologist:  Gypsy Balsam, MD    Referring MD: Wanda Plump, MD   Chief Complaint  Patient presents with   Follow-up  Doing well  History of Present Illness:    James [name] is a 67 y.o. male    with past medical history significant for coronary artery disease status post PTCA of the LAD in 2022, diabetes, essential hypertension, dyslipidemia, nonalcoholic cirrhosis of the liver.  He was admitted recently to the hospital on 15 August 2021 because of intermittent chest pain.  Cardiac catheterization has been performed cardiac catheterization showed unchanged lesion compared to prior cardiac catheterization he does have about 75% lesion on the circumflex artery.  He also got multiple residual 30 to 50% lesions.  Last time I seen him he was complaining of some chest pain.  Was very atypical happening at night.  We increased dose of Imdur that seems to be helping.  Past Medical History:  Diagnosis Date   Abnormal cardiac CT angiography    Acid reflux    Annual physical exam 04/08/2015   Arthritis    Atypical chest pain 06/13/2020   Blood transfusion without reported diagnosis    Body mass index (BMI) 35.0-35.9, adult 04/05/2019   Chronic fatigue 01/28/2015   Chronic headaches    on cymbalta   Chronic migraine w/o aura, not intractable, w/o stat migr 10/24/2018   Cirrhosis    Colon polyps    Complication of anesthesia    problems waking up from anesthesia   Coronary artery disease 01/23/2019   Depression    on cymbalta   Diabetes mellitus with neuropathy    Diabetes with neuropathy 04/25/2013   Diverticulitis 03/2013   Dyslipidemia 05/29/2019   Eczema    Elevated LFTs    Epidural lipomatosis 10/05/2018   Essential (primary) hypertension 04/05/2019   Essential hypertension 10/10/2019   Fatty liver    GERD (gastroesophageal reflux disease) 04/28/2011    H/O craniotomy 05/07/2015   Headache 04/28/2011   Hepatitis 10/2017   NASH cirrhosis   History of kidney stones    Hyperlipidemia    Hypersomnia with sleep apnea 01/28/2015   Hypertension    IDA (iron deficiency anemia) 01/24/2019   Idiopathic intracranial hypertension 01/14/2017   Insomnia 04/26/2013   Kidney stone    Liver cirrhosis secondary to NASH (nonalcoholic steatohepatitis) 01/02/2016   Low back pain 04/05/2019   Lower back injury 08/14/2019   Morbid obesity    Neuromuscular disorder    neuropathy   Neuropathy    Nonalcoholic steatohepatitis 10/05/2018   Obstructive hydrocephalus 01/28/2015   OSA -- dx ~ 2012, cpap intolerant 09/04/2014    dx ~ 2012, cpap intolerant    PCP NOTES >>> 04/08/2015   Post-op pain 03/19/2019   Post-traumatic hydrocephalus    s/p shunts x 2 (first got infected )   Presence of cerebrospinal fluid drainage device 07/28/2011   Psoriasis    sees Dr Lenis Noon   Psoriatic arthritis    REM behavioral disorder 01/14/2017   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   see's Dr.Ortmann   Severe obesity (BMI >= 40) 01/28/2015   SI (sacroiliac) joint dysfunction 08/14/2019   Sigmoid diverticulitis 04/25/2013   Sleep apnea    no CPAP      Spondylolisthesis, lumbar region 03/16/2019   Stomach ulcer  Testosterone deficiency 04/28/2011   VP (ventriculoperitoneal) shunt status 07/31/2020    Past Surgical History:  Procedure Laterality Date   AMPUTATION Left 12/27/2021   Procedure: AMPUTATION GREAT TOE;  Surgeon: Vivi Barrack, DPM;  Location: [facility] OR;  Service: Podiatry;  Laterality: Left;   BACK SURGERY  1980   BRAIN SURGERY     VP shunts placed in 2007   CHOLECYSTECTOMY N/A 08/25/2017   Procedure: LAPAROSCOPIC CHOLECYSTECTOMY WITH INTRAOPERATIVE CHOLANGIOGRAM;  Surgeon: Griselda Miner, MD;  Location: [facility] OR;  Service: General;  Laterality: N/A;   COLONOSCOPY     CORONARY STENT INTERVENTION N/A 09/27/2020   Procedure: CORONARY STENT INTERVENTION;   Surgeon: Corky Crafts, MD;  Location: [facility] INVASIVE CV LAB;  Service: Cardiovascular;  Laterality: N/A;   CORONARY ULTRASOUND/IVUS N/A 09/27/2020   Procedure: Intravascular Ultrasound/IVUS;  Surgeon: Corky Crafts, MD;  Location: [facility] INVASIVE CV LAB;  Service: Cardiovascular;  Laterality: N/A;   JOINT REPLACEMENT     total hip   LEFT HEART CATH N/A 09/27/2020   Procedure: Left Heart Cath;  Surgeon: Corky Crafts, MD;  Location: [facility] INVASIVE CV LAB;  Service: Cardiovascular;  Laterality: N/A;   LEFT HEART CATH AND CORONARY ANGIOGRAPHY N/A 09/24/2020   Procedure: LEFT HEART CATH AND CORONARY ANGIOGRAPHY;  Surgeon: Corky Crafts, MD;  Location: [facility] INVASIVE CV LAB;  Service: Cardiovascular;  Laterality: N/A;   LEFT HEART CATH AND CORONARY ANGIOGRAPHY N/A 08/18/2021   Procedure: LEFT HEART CATH AND CORONARY ANGIOGRAPHY;  Surgeon: Lennette Bihari, MD;  Location: [facility] INVASIVE CV LAB;  Service: Cardiovascular;  Laterality: N/A;   LUMBAR FUSION  03/16/2019   SHOULDER SURGERY Left 2010   TEE WITHOUT CARDIOVERSION N/A 01/01/2022   Procedure: TRANSESOPHAGEAL ECHOCARDIOGRAM (TEE);  Surgeon: Thomasene Ripple, DO;  Location: [facility] ENDOSCOPY;  Service: Cardiovascular;  Laterality: N/A;   TOE SURGERY Left 2018   TONSILLECTOMY     as a child   TOTAL HIP ARTHROPLASTY Left 2011   UPPER GASTROINTESTINAL ENDOSCOPY  01/04/2020   VENTRICULOPERITONEAL SHUNT  2007   x2    Current Medications: Current Meds  Medication Sig   acetaminophen (TYLENOL) 325 MG tablet Take 2 tablets (650 mg total) by mouth every 4 (four) hours as needed for headache or mild pain.   Adalimumab (HUMIRA, 2 PEN,) 40 MG/0.8ML PNKT Inject 40 mg (0.8 ml) under the skin every other week (Patient taking differently: Inject 40 mg into the skin every 14 (fourteen) days.)   amLODipine (NORVASC) 5 MG tablet Take 1 tablet (5 mg total) by mouth daily.   aspirin 81 MG chewable tablet Chew 1 tablet (81 mg total) by mouth daily.   atorvastatin  (LIPITOR) 80 MG tablet Take 1 tablet (80 mg total) by mouth at bedtime.   botulinum toxin Type A (BOTOX) 200 units injection Provider to inject 155 units into the muscles of the head and neck every 3 months. Discard remainder.   carvedilol (COREG) 12.5 MG tablet Take 1 tablet (12.5 mg total) by mouth 2 (two) times daily with a meal.   clobetasol (TEMOVATE) 0.05 % external solution Apply 1 application externally twice a day 14 days. (Patient taking differently: Apply 1 Application topically 2 (two) times daily.)   clonazePAM (KLONOPIN) 0.5 MG tablet Take 1 tablet (0.5 mg total) by mouth at bedtime.   clopidogrel (PLAVIX) 75 MG tablet TAKE 1 TABLET (75 MG TOTAL) BY MOUTH DAILY.   Continuous Glucose Sensor (DEXCOM G6 SENSOR) MISC Use to monitor blood sugar, change  after 10 days (Patient taking differently: 1 each by Other route See admin instructions. Use to monitor blood sugar, change after 10 days)   DULoxetine (CYMBALTA) 60 MG capsule Take 2 capsules (120 mg total) by mouth daily.   EPINEPHrine (EPIPEN 2-PAK) 0.3 mg/0.3 mL IJ SOAJ injection Inject 0.3 mLs (0.3 mg total) into the muscle once as needed for up to 1 dose for anaphylaxis.   fenofibrate micronized (LOFIBRA) 134 MG capsule Take 1 capsule (134 mg total) by mouth 3 (three) times a week.   gabapentin (NEURONTIN) 600 MG tablet Take 1 tablet (600 mg total) by mouth 2 (two) times daily.   glimepiride (AMARYL) 4 MG tablet Take 1 tablet (4 mg total) by mouth daily before breakfast.   Insulin Disposable Pump (OMNIPOD 5 G6 PODS, GEN 5,) MISC 1 Device by Does not apply route every 3 (three) days.   insulin lispro (HUMALOG) 100 UNIT/ML injection Use up to 60 Units daily in pump (Patient taking differently: Inject 60 Units into the skin See admin instructions. Use up to 60 Units daily in pump)   Insulin Syringe-Needle U-100 (INSULIN SYRINGE 1CC/31GX5/16") 31G X 5/16" 1 ML MISC Use to administer Humalog 3 times a day (Patient taking differently: 1 tablet  by Other route See admin instructions. Use to administer Humalog 3 times a day)   isosorbide mononitrate (IMDUR) 120 MG 24 hr tablet Take 1 tablet (120 mg total) by mouth daily.   liraglutide (VICTOZA) 18 MG/3ML SOPN Start with 0.6mg  subcutaneously once a day for 7 days, then increase to 1.2mg  once a day (Patient taking differently: Inject 0.6 mg into the skin every 7 (seven) days. Start with 0.6mg  subcutaneously once a day for 7 days, then increase to 1.2mg  once a day)   metFORMIN (GLUCOPHAGE) 500 MG tablet Take 2 tablets (1,000 mg total) by mouth 2 (two) times daily.   nitroGLYCERIN (NITROSTAT) 0.4 MG SL tablet Place 1 tablet (0.4 mg total) under the tongue every 5 (five) minutes for 3 doses as needed for chest pain.   pantoprazole (PROTONIX) 40 MG tablet Take 1 tablet (40 mg total) by mouth daily.     Allergies:   Bee venom, Hydrocodone bit-homatrop mbr, Toradol [ketorolac tromethamine], Prednisone, Sulfadiazine, Morphine and related, and Sulfa drugs cross reactors   Social History   Socioeconomic History   Marital status: Married    Spouse name: Burna Mortimer   Number of children: 2   Years of education: Not on file   Highest education level: Not on file  Occupational History   Occupation: disabled   Tobacco Use   Smoking status: Never   Smokeless tobacco: Never  Vaping Use   Vaping Use: Never used  Substance and Sexual Activity   Alcohol use: No   Drug use: No   Sexual activity: Yes    Partners: Female  Other Topics Concern   Not on file  Social History Narrative   Household-- pt , wife, one adult son with Down's syndrome   younger son lives in [place]   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   Social Determinants of Health   Financial Resource Strain: Low Risk  (03/18/2021)   Overall Financial Resource Strain (CARDIA)    Difficulty of Paying Living Expenses: Not hard at all  Food Insecurity: No Food Insecurity (01/22/2022)   Hunger Vital  Sign    Worried About Running Out of Food in the Last Year: Never true    Ran Out of  Food in the Last Year: Never true  Transportation Needs: No Transportation Needs (01/22/2022)   PRAPARE - Administrator, Civil Service (Medical): No    Lack of Transportation (Non-Medical): No  Physical Activity: Inactive (03/18/2021)   Exercise Vital Sign    Days of Exercise per Week: 0 days    Minutes of Exercise per Session: 0 min  Stress: No Stress Concern Present (03/18/2021)   [employer] of Occupational Health - Occupational Stress Questionnaire    Feeling of Stress : Not at all  Social Connections: Moderately Integrated (03/18/2021)   Social Connection and Isolation Panel [NHANES]    Frequency of Communication with Friends and Family: More than three times a week    Frequency of Social Gatherings with Friends and Family: Once a week    Attends Religious Services: Never    Database administrator or Organizations: Yes    Attends Engineer, structural: 1 to 4 times per year    Marital Status: Married     Family History: The patient's family history includes Down syndrome in his son; Heart disease in his brother; Lung cancer in his father; Other in his brother and mother. There is no history of Diabetes, Prostate cancer, Colon cancer, Stomach cancer, Pancreatic cancer, or Liver disease. ROS:   Please see the history of present illness.    All 14 point review of systems negative except as described per history of present illness  EKGs/Labs/Other Studies Reviewed:      Recent Labs: 12/31/2021: Magnesium 1.8 07/06/2022: ALT 19 07/23/2022: Hemoglobin 13.3; Platelets 143.0 09/01/2022: BUN 24; Creatinine, Ser 1.55; Potassium 4.4; Sodium 139  Recent Lipid Panel    Component Value Date/Time   CHOL 137 01/30/2022 1007   CHOL 152 10/30/2020 0920   TRIG 137.0 01/30/2022 1007   HDL 42.10 01/30/2022 1007   HDL 39 (L) 10/30/2020 0920   CHOLHDL 3 01/30/2022 1007   VLDL 27.4  01/30/2022 1007   LDLCALC 68 01/30/2022 1007   LDLCALC 68 10/30/2020 0920   LDLDIRECT 67.0 12/31/2020 0936    Physical Exam:    VS:  BP 130/80 (BP Location: Left Arm, Patient Position: Sitting)   Pulse 83   Ht 5\' 11"  (1.803 m)   Wt 250 lb (113.4 kg)   SpO2 97%   BMI 34.87 kg/m     Wt Readings from Last 3 Encounters:  10/16/22 250 lb (113.4 kg)  10/12/22 251 lb 8 oz (114.1 kg)  10/07/22 254 lb (115.2 kg)     GEN:  Well nourished, well developed in no acute distress HEENT: Normal NECK: No JVD; No carotid bruits LYMPHATICS: No lymphadenopathy CARDIAC: RRR, no murmurs, no rubs, no gallops RESPIRATORY:  Clear to auscultation without rales, wheezing or rhonchi  ABDOMEN: Soft, non-tender, non-distended MUSCULOSKELETAL:  No edema; No deformity  SKIN: Warm and dry LOWER EXTREMITIES: no swelling NEUROLOGIC:  Alert and oriented x 3 PSYCHIATRIC:  Normal affect   ASSESSMENT:    1. Coronary artery disease involving native coronary artery of native heart without angina pectoris   2. Essential hypertension   3. OSA -- dx ~ 2012, cpap intolerant    PLAN:    In order of problems listed above:  Coronary disease.  Stable from that point review and appropriate guideline directed medical therapy which I will continue. Essential hypertension: Blood pressure well-controlled continue present management. Dyslipidemia I did review his K PN which show me LDL 68 HDL 42 we will continue present management. Obstructive  sleep apnea intolerant to mask.   Medication Adjustments/Labs and Tests Ordered: Current medicines are reviewed at length with the patient today.  Concerns regarding medicines are outlined above.  No orders of the defined types were placed in this encounter.  Medication changes: No orders of the defined types were placed in this encounter.   Signed, Georgeanna Lea, MD, [facility] 10/16/2022 10:59 AM    [facility]

## 2022-10-18 ENCOUNTER — Emergency Department (HOSPITAL_BASED_OUTPATIENT_CLINIC_OR_DEPARTMENT_OTHER)
Admission: EM | Admit: 2022-10-18 | Discharge: 2022-10-18 | Disposition: A | Discharge status: Home / Self Care | Payer: 59 | Attending: Emergency Medicine | Admitting: Emergency Medicine

## 2022-10-18 ENCOUNTER — Emergency Department (HOSPITAL_BASED_OUTPATIENT_CLINIC_OR_DEPARTMENT_OTHER): Payer: 59

## 2022-10-18 ENCOUNTER — Other Ambulatory Visit: Payer: Self-pay

## 2022-10-18 DIAGNOSIS — Z79899 Other long term (current) drug therapy: Secondary | ICD-10-CM | POA: Insufficient documentation

## 2022-10-18 DIAGNOSIS — I10 Essential (primary) hypertension: Secondary | ICD-10-CM | POA: Insufficient documentation

## 2022-10-18 DIAGNOSIS — R1032 Left lower quadrant pain: Secondary | ICD-10-CM | POA: Diagnosis present

## 2022-10-18 DIAGNOSIS — Z7902 Long term (current) use of antithrombotics/antiplatelets: Secondary | ICD-10-CM | POA: Insufficient documentation

## 2022-10-18 DIAGNOSIS — I2510 Atherosclerotic heart disease of native coronary artery without angina pectoris: Secondary | ICD-10-CM | POA: Diagnosis not present

## 2022-10-18 DIAGNOSIS — K7460 Unspecified cirrhosis of liver: Secondary | ICD-10-CM | POA: Diagnosis not present

## 2022-10-18 DIAGNOSIS — K5732 Diverticulitis of large intestine without perforation or abscess without bleeding: Secondary | ICD-10-CM | POA: Insufficient documentation

## 2022-10-18 DIAGNOSIS — D696 Thrombocytopenia, unspecified: Secondary | ICD-10-CM | POA: Diagnosis not present

## 2022-10-18 DIAGNOSIS — K5792 Diverticulitis of intestine, part unspecified, without perforation or abscess without bleeding: Secondary | ICD-10-CM

## 2022-10-18 DIAGNOSIS — Z7982 Long term (current) use of aspirin: Secondary | ICD-10-CM | POA: Diagnosis not present

## 2022-10-18 DIAGNOSIS — Z794 Long term (current) use of insulin: Secondary | ICD-10-CM | POA: Insufficient documentation

## 2022-10-18 DIAGNOSIS — K5730 Diverticulosis of large intestine without perforation or abscess without bleeding: Secondary | ICD-10-CM | POA: Diagnosis not present

## 2022-10-18 DIAGNOSIS — N200 Calculus of kidney: Secondary | ICD-10-CM | POA: Diagnosis not present

## 2022-10-18 LAB — COMPREHENSIVE METABOLIC PANEL
ALT: 43 U/L (ref 0–44)
AST: 35 U/L (ref 15–41)
Albumin: 3.4 g/dL — ABNORMAL LOW (ref 3.5–5.0)
Alkaline Phosphatase: 84 U/L (ref 38–126)
Anion gap: 7 (ref 5–15)
BUN: 21 mg/dL (ref 8–23)
CO2: 21 mmol/L — ABNORMAL LOW (ref 22–32)
Calcium: 8.6 mg/dL — ABNORMAL LOW (ref 8.9–10.3)
Chloride: 107 mmol/L (ref 98–111)
Creatinine, Ser: 1.36 mg/dL — ABNORMAL HIGH (ref 0.61–1.24)
GFR, Estimated: 58 mL/min — ABNORMAL LOW (ref >60)
Glucose, Bld: 282 mg/dL — ABNORMAL HIGH (ref 70–99)
Potassium: 4.3 mmol/L (ref 3.5–5.1)
Sodium: 135 mmol/L (ref 135–145)
Total Bilirubin: 1 mg/dL (ref 0.3–1.2)
Total Protein: 7 g/dL (ref 6.5–8.1)

## 2022-10-18 LAB — CBC WITH DIFFERENTIAL/PLATELET
Abs Immature Granulocytes: 0.04 10*3/uL (ref 0.00–0.07)
Basophils Absolute: 0 10*3/uL (ref 0.0–0.1)
Basophils Relative: 0 %
Eosinophils Absolute: 0 10*3/uL (ref 0.0–0.5)
Eosinophils Relative: 0 %
HCT: 37.9 % — ABNORMAL LOW (ref 39.0–52.0)
Hemoglobin: 12.8 g/dL — ABNORMAL LOW (ref 13.0–17.0)
Immature Granulocytes: 0 %
Lymphocytes Relative: 12 %
Lymphs Abs: 1.1 10*3/uL (ref 0.7–4.0)
MCH: 29.1 pg (ref 26.0–34.0)
MCHC: 33.8 g/dL (ref 30.0–36.0)
MCV: 86.1 fL (ref 80.0–100.0)
Monocytes Absolute: 0.7 10*3/uL (ref 0.1–1.0)
Monocytes Relative: 7 %
Neutro Abs: 7.8 10*3/uL — ABNORMAL HIGH (ref 1.7–7.7)
Neutrophils Relative %: 81 %
Platelets: 129 10*3/uL — ABNORMAL LOW (ref 150–400)
RBC: 4.4 MIL/uL (ref 4.22–5.81)
RDW: 15.7 % — ABNORMAL HIGH (ref 11.5–15.5)
WBC: 9.7 10*3/uL (ref 4.0–10.5)
nRBC: 0 % (ref 0.0–0.2)

## 2022-10-18 LAB — URINALYSIS, MICROSCOPIC (REFLEX)
Bacteria, UA: NONE SEEN
RBC / HPF: NONE SEEN RBC/hpf (ref 0–5)

## 2022-10-18 LAB — URINALYSIS, ROUTINE W REFLEX MICROSCOPIC
Bilirubin Urine: NEGATIVE
Glucose, UA: >=500 mg/dL — AB
Hgb urine dipstick: NEGATIVE
Ketones, ur: NEGATIVE mg/dL
Leukocytes,Ua: NEGATIVE
Nitrite: NEGATIVE
Protein, ur: NEGATIVE mg/dL
Specific Gravity, Urine: 1.02 (ref 1.005–1.030)
pH: 6 (ref 5.0–8.0)

## 2022-10-18 LAB — LIPASE, BLOOD: Lipase: 34 U/L (ref 11–51)

## 2022-10-18 MED ORDER — METRONIDAZOLE 500 MG PO TABS
500.0000 mg | ORAL_TABLET | Freq: Two times a day (BID) | ORAL | 0 refills | Status: DC
  Filled 2022-10-18: qty 14, 7d supply, fill #0

## 2022-10-18 MED ORDER — TRAMADOL HCL 50 MG PO TABS
50.0000 mg | ORAL_TABLET | Freq: Once | ORAL | Status: AC
  Administered 2022-10-18: 50 mg via ORAL
  Filled 2022-10-18: qty 1

## 2022-10-18 MED ORDER — CIPROFLOXACIN HCL 500 MG PO TABS
500.0000 mg | ORAL_TABLET | Freq: Two times a day (BID) | ORAL | 0 refills | Status: AC
  Filled 2022-10-18: qty 14, 7d supply, fill #0

## 2022-10-18 MED ORDER — METRONIDAZOLE 500 MG/100ML IV SOLN
500.0000 mg | Freq: Once | INTRAVENOUS | Status: AC
  Administered 2022-10-18: 500 mg via INTRAVENOUS
  Filled 2022-10-18: qty 100

## 2022-10-18 MED ORDER — SODIUM CHLORIDE 0.9 % IV BOLUS (SEPSIS)
1000.0000 mL | Freq: Once | INTRAVENOUS | Status: AC
  Administered 2022-10-18: 1000 mL via INTRAVENOUS

## 2022-10-18 MED ORDER — TRAMADOL HCL 50 MG PO TABS
50.0000 mg | ORAL_TABLET | Freq: Four times a day (QID) | ORAL | 0 refills | Status: DC | PRN
  Filled 2022-10-18: qty 10, 3d supply, fill #0

## 2022-10-18 MED ORDER — ONDANSETRON 4 MG PO TBDP
4.0000 mg | ORAL_TABLET | Freq: Three times a day (TID) | ORAL | 0 refills | Status: DC | PRN
  Filled 2022-10-18: qty 20, 7d supply, fill #0

## 2022-10-18 MED ORDER — IOHEXOL 300 MG/ML  SOLN
100.0000 mL | Freq: Once | INTRAMUSCULAR | Status: AC | PRN
  Administered 2022-10-18: 100 mL via INTRAVENOUS

## 2022-10-18 MED ORDER — SODIUM CHLORIDE 0.9 % IV SOLN
2.0000 g | Freq: Once | INTRAVENOUS | Status: AC
  Administered 2022-10-18: 2 g via INTRAVENOUS
  Filled 2022-10-18: qty 20

## 2022-10-18 NOTE — ED Notes (Signed)
D/c paperwork reviewed with pt, including prescriptions and follow up care.  All questions and/or concerns addressed at time of d/c.  No further needs expressed. . Pt verbalized understanding, Ambulatory with family to ED exit, NAD.   

## 2022-10-18 NOTE — ED Notes (Signed)
Pt ambulatory from triage 

## 2022-10-18 NOTE — ED Triage Notes (Signed)
Patient presents to ED via POV from home. Here with lower abdominal pain. Denies nausea, vomiting, diarrhea or dysuria. Reports he has a shunt that goes into his stomach. History of kidney stones.

## 2022-10-18 NOTE — Discharge Instructions (Addendum)
You were seen for abdominal pain and your CT scan showed evidence of diverticulitis.  Read instruction included in your discharge paperwork.  Take all the antibiotics as prescribed.  Do not miss any doses.  You have been given tramadol to take as needed for severe uncontrolled pain.  You have also been prescribed Zofran to take as needed for nausea or vomiting.  Drink plenty of fluids and slowly advance your diet from liquids to solids.  Follow-up with your GI doctor after your infection has resolved to have a repeat colonoscopy outpatient.  Come back if any severe worsening uncontrolled pain, uncontrollable nausea vomiting, inability to keep your medications down, Confusion, or any other symptoms concerning to you.

## 2022-10-18 NOTE — ED Provider Notes (Signed)
EMERGENCY DEPARTMENT AT [facility] Provider Note   CSN: [id number] Arrival date & time: 10/18/22  1703     History  Chief Complaint  Patient presents with   Abdominal Pain    James [name] is a 67 y.o. male.  With PMH of CAD status post LAD stent, HTN, HLD, obstructive hydrocephalus status post VP shunt, nonalcoholic cirrhosis of liver who presents with left lower abdominal pain.  Patient said he has been having worsening pain over the past week.  He feels it mainly in the left lower quadrant.  He has no associated back pain.  He has had no fevers, no nausea, no vomiting, no diarrhea.  He had 2 bowel movements today which were normal for him.  No melena or bright red blood per rectum.  No headaches or altered mental status.  No new weakness in the extremities.  He tries to avoid any pain medicines because   Abdominal Pain      Home Medications Prior to Admission medications   Medication Sig Start Date End Date Taking? Authorizing Provider  ciprofloxacin (CIPRO) 500 MG tablet Take 1 tablet (500 mg total) by mouth every 12 (twelve) hours for 7 days. 10/18/22 10/25/22 Yes Mardene Sayer, MD  metroNIDAZOLE (FLAGYL) 500 MG tablet Take 1 tablet (500 mg total) by mouth 2 (two) times daily. 10/18/22  Yes Mardene Sayer, MD  ondansetron (ZOFRAN-ODT) 4 MG disintegrating tablet Take 1 tablet (4 mg total) by mouth every 8 (eight) hours as needed. 10/18/22  Yes Mardene Sayer, MD  traMADol (ULTRAM) 50 MG tablet Take 1 tablet (50 mg total) by mouth every 6 (six) hours as needed for up to 10 doses for severe pain. 10/18/22  Yes Mardene Sayer, MD  acetaminophen (TYLENOL) 325 MG tablet Take 2 tablets (650 mg total) by mouth every 4 (four) hours as needed for headache or mild pain. 08/18/21   Leone Brand, NP  Adalimumab (HUMIRA, 2 PEN,) 40 MG/0.8ML PNKT Inject 40 mg (0.8 ml) under the skin every other week Patient taking differently: Inject 40 mg into the  skin every 14 (fourteen) days. 07/06/22   Quentin Angst, MD  amLODipine (NORVASC) 5 MG tablet Take 1 tablet (5 mg total) by mouth daily. 12/15/21   Georgeanna Lea, MD  aspirin 81 MG chewable tablet Chew 1 tablet (81 mg total) by mouth daily. 08/19/21   Leone Brand, NP  atorvastatin (LIPITOR) 80 MG tablet Take 1 tablet (80 mg total) by mouth at bedtime. 09/30/22   Wanda Plump, MD  botulinum toxin Type A (BOTOX) 200 units injection Provider to inject 155 units into the muscles of the head and neck every 3 months. Discard remainder. 10/12/22   Sater, Pearletha Furl, MD  carvedilol (COREG) 12.5 MG tablet Take 1 tablet (12.5 mg total) by mouth 2 (two) times daily with a meal. 07/21/22   Paz, Nolon Rod, MD  clobetasol (TEMOVATE) 0.05 % external solution Apply 1 application externally twice a day 14 days. Patient taking differently: Apply 1 Application topically 2 (two) times daily. 10/01/22     clonazePAM (KLONOPIN) 0.5 MG tablet Take 1 tablet (0.5 mg total) by mouth at bedtime. 10/12/22 04/10/23  Sater, Pearletha Furl, MD  clopidogrel (PLAVIX) 75 MG tablet TAKE 1 TABLET (75 MG TOTAL) BY MOUTH DAILY. 12/16/21 12/29/22  Georgeanna Lea, MD  Continuous Glucose Sensor (DEXCOM G6 SENSOR) MISC Use to monitor blood sugar, change after 10 days Patient taking  differently: 1 each by Other route See admin instructions. Use to monitor blood sugar, change after 10 days 10/07/22   Reather Littler, MD  DULoxetine (CYMBALTA) 60 MG capsule Take 2 capsules (120 mg total) by mouth daily. 09/30/22   Wanda Plump, MD  EPINEPHrine (EPIPEN 2-PAK) 0.3 mg/0.3 mL IJ SOAJ injection Inject 0.3 mLs (0.3 mg total) into the muscle once as needed for up to 1 dose for anaphylaxis. 01/10/20   Wanda Plump, MD  fenofibrate micronized (LOFIBRA) 134 MG capsule Take 1 capsule (134 mg total) by mouth 3 (three) times a week. 05/27/22 05/27/23  Reather Littler, MD  gabapentin (NEURONTIN) 600 MG tablet Take 1 tablet (600 mg total) by mouth 2 (two) times daily. 09/30/22    Wanda Plump, MD  glimepiride (AMARYL) 4 MG tablet Take 1 tablet (4 mg total) by mouth daily before breakfast. 06/26/22   Reather Littler, MD  Insulin Disposable Pump (OMNIPOD 5 G6 PODS, GEN 5,) MISC 1 Device by Does not apply route every 3 (three) days. 09/09/22   Reather Littler, MD  insulin lispro (HUMALOG) 100 UNIT/ML injection Use up to 60 Units daily in pump Patient taking differently: Inject 60 Units into the skin See admin instructions. Use up to 60 Units daily in pump 10/07/22   Reather Littler, MD  Insulin Syringe-Needle U-100 (INSULIN SYRINGE 1CC/31GX5/16") 31G X 5/16" 1 ML MISC Use to administer Humalog 3 times a day Patient taking differently: 1 tablet by Other route See admin instructions. Use to administer Humalog 3 times a day 10/07/22   Reather Littler, MD  isosorbide mononitrate (IMDUR) 120 MG 24 hr tablet Take 1 tablet (120 mg total) by mouth daily. 07/14/22   Georgeanna Lea, MD  liraglutide (VICTOZA) 18 MG/3ML SOPN Start with 0.6mg  subcutaneously once a day for 7 days, then increase to 1.2mg  once a day Patient taking differently: Inject 0.6 mg into the skin every 7 (seven) days. Start with 0.6mg  subcutaneously once a day for 7 days, then increase to 1.2mg  once a day 06/18/22   Reather Littler, MD  metFORMIN (GLUCOPHAGE) 500 MG tablet Take 2 tablets (1,000 mg total) by mouth 2 (two) times daily. 10/06/22   Georgeanna Lea, MD  nitroGLYCERIN (NITROSTAT) 0.4 MG SL tablet Place 1 tablet (0.4 mg total) under the tongue every 5 (five) minutes for 3 doses as needed for chest pain. 06/26/21   Wanda Plump, MD  pantoprazole (PROTONIX) 40 MG tablet Take 1 tablet (40 mg total) by mouth daily. 09/21/22   Wanda Plump, MD      Allergies    Bee venom, Hydrocodone bit-homatrop mbr, Toradol [ketorolac tromethamine], Prednisone, Sulfadiazine, Morphine and related, and Sulfa drugs cross reactors    Review of Systems   Review of Systems  Gastrointestinal:  Positive for abdominal pain.    Physical Exam Updated  Vital Signs BP 132/88   Pulse 72   Temp 98.2 F (36.8 C) (Oral)   Resp 16   Ht 5\' 11"  (1.803 m)   Wt 113.4 kg   SpO2 94%   BMI 34.87 kg/m  Physical Exam Constitutional: Alert and oriented.  Uncomfortable but nontoxic and acting appropriately Eyes: Conjunctivae are normal. ENT      Head: Normocephalic and atraumatic. Neck no meningismus Cardiovascular: S1, S2, regular rate and rhythm. Respiratory: Normal respiratory effort.  O2 sat 94-95 on RA Gastrointestinal: Mildly distended but soft with left lower quadrant tenderness and voluntary guarding.  Abdomen not peritonitic.  No CVAT. Musculoskeletal:  Normal range of motion in all extremities.      Right lower leg: No tenderness or edema.      Left lower leg: No tenderness or edema. Neurologic: Normal speech and language. No gross focal neurologic deficits are appreciated. Skin: Skin is warm, dry and intact. No rash noted. Psychiatric: Mood and affect are normal. Speech and behavior are normal.  ED Results / Procedures / Treatments   Labs (all labs ordered are listed, but only abnormal results are displayed) Labs Reviewed  COMPREHENSIVE METABOLIC PANEL - Abnormal; Notable for the following components:      Result Value   CO2 21 (*)    Glucose, Bld 282 (*)    Creatinine, Ser 1.36 (*)    Calcium 8.6 (*)    Albumin 3.4 (*)    GFR, Estimated 58 (*)    All other components within normal limits  CBC WITH DIFFERENTIAL/PLATELET - Abnormal; Notable for the following components:   Hemoglobin 12.8 (*)    HCT 37.9 (*)    RDW 15.7 (*)    Platelets 129 (*)    Neutro Abs 7.8 (*)    All other components within normal limits  URINALYSIS, ROUTINE W REFLEX MICROSCOPIC - Abnormal; Notable for the following components:   Glucose, UA >=500 (*)    All other components within normal limits  LIPASE, BLOOD  URINALYSIS, MICROSCOPIC (REFLEX)    EKG None  Radiology CT ABDOMEN PELVIS W CONTRAST  Result Date: 10/18/2022 CLINICAL DATA:  Left  lower quadrant pain, tender to palpation, history of cirrhosis EXAM: CT ABDOMEN AND PELVIS WITH CONTRAST TECHNIQUE: Multidetector CT imaging of the abdomen and pelvis was performed using the standard protocol following bolus administration of intravenous contrast. RADIATION DOSE REDUCTION: This exam was performed according to the departmental dose-optimization program which includes automated exposure control, adjustment of the mA and/or kV according to patient size and/or use of iterative reconstruction technique. CONTRAST:  OMNIPAQUE IOHEXOL 300 MG/ML  SOLN COMPARISON:  05/29/2022, 10/24/2021 FINDINGS: Lower chest: No acute pleural or parenchymal lung disease. Hepatobiliary: Stable nodularity of the liver contour consistent with cirrhosis. No focal parenchymal abnormality on this single phase exam. Cholecystectomy. No biliary duct dilation. Pancreas: Unremarkable. No pancreatic ductal dilatation or surrounding inflammatory changes. Spleen: Stable splenomegaly.  No focal parenchymal abnormality. Adrenals/Urinary Tract: Stable 4 mm nonobstructing right renal calculus. Stable appearance of the renal parenchyma. The adrenals are unremarkable. The bladder is decompressed, with no gross abnormality. Stomach/Bowel: There is diverticulosis of the distal colon, with segmental wall thickening and pericolonic fat stranding involving the sigmoid colon, best seen on image 79/2. Findings are consistent with acute diverticulitis, though close follow-up is recommended to document complete resolution and exclude underlying neoplasm, given the lack of documented interval clearance since previous diverticulitis on the prior exam. No evidence of perforation, fluid collection, or abscess. Normal appendix right lower quadrant. Vascular/Lymphatic: Aortic atherosclerosis. No pathologic adenopathy. Reproductive: Prostate is unremarkable. Other: Mesenteric edema left lower quadrant associated with the sigmoid colon wall thickening  described above. There is trace free fluid within the right upper quadrant around the liver. Ventriculoperitoneal shunt enters the abdomen in the periumbilical region, tip within the left lower quadrant. There is no free intraperitoneal gas. No abdominal wall hernia. Musculoskeletal: No acute or destructive bony lesions. Postsurgical changes are seen from left hip arthroplasty and L4-S1 laminectomy and posterior fusion. Reconstructed images demonstrate no additional findings. IMPRESSION: 1. Segmental wall thickening and pericolonic fat stranding within the mid sigmoid colon, compatible with acute  uncomplicated diverticulitis. This is in a similar location to the inflammatory changes seen on the prior CT dated 10/24/2021. Given the lack of documented interval clearance, I would recommend follow-up imaging after appropriate medical management to document resolution and exclude underlying neoplasm. 2. Cirrhosis, with trace ascites right upper quadrant. 3. Stable nonobstructing 4 mm right renal calculus. 4. Stable splenomegaly. 5.  Aortic Atherosclerosis (ICD10-I70.0). Electronically Signed   By: Sharlet Salina M.D.   On: 10/18/2022 18:57    Procedures Procedures    Medications Ordered in ED Medications  cefTRIAXone (ROCEPHIN) 2 g in sodium chloride 0.9 % 100 mL IVPB (2 g Intravenous New Bag/Given 10/18/22 1948)    And  metroNIDAZOLE (FLAGYL) IVPB 500 mg (has no administration in time range)  traMADol (ULTRAM) tablet 50 mg (50 mg Oral Given 10/18/22 1802)  sodium chloride 0.9 % bolus 1,000 mL (0 mLs Intravenous Stopped 10/18/22 1949)  iohexol (OMNIPAQUE) 300 MG/ML solution 100 mL (100 mLs Intravenous Contrast Given 10/18/22 1837)    ED Course/ Medical Decision Making/ A&P                             Medical Decision Making James [name] is a 67 y.o. male.  With PMH of CAD status post LAD stent, HTN, HLD, obstructive hydrocephalus status post VP shunt, nonalcoholic cirrhosis of liver who presents with  left lower abdominal pain.   Based on the patient's left lower quadrant pain, differential includes but is not limited to UTI, SBO, diverticulitis, colitis. Less likely nephrolithiasis or pyelonephritis with no CVAT and no urinary symptoms.  Despite history of cirrhosis, no fevers, no confusion, no vomiting, no diffuse tenderness, not concern for SBP.\  Labs reviewed by me generally unremarkable and reassuring.  Normal white blood cell count 9.7.  Chronic anemia hemoglobin 12.8 and thrombocytopenia 129 likely due to related cirrhosis.  Lipase 34 within normal limits and no transaminitis.  Creatinine 1.36 improved from baseline.  CT abdomen pelvis with contrast obtained which I personally reviewed showing no evidence of bowel obstruction however evidence of acute uncomplicated sigmoid diverticulitis.  Patient not septic and tolerating p.o.  Patient reassessed after fluids, tramadol generally unchanged but tolerating p.o. and getting dose of IV Rocephin and Flagyl here.  He would like to go home and take antibiotics prescribed 7 days of ciprofloxacin and Flagyl as well as as needed tramadol and Zofran for symptom control.  Patient has [facility] who he will follow-up with outpatient for colonoscopy after resolution of infection.  He is aware of return precautions as well as wife.  He will return if any worsening symptoms.  Discharged in good/stable condition.   Amount and/or Complexity of Data Reviewed Labs: ordered. Radiology: ordered.  Risk Prescription drug management.      Final Clinical Impression(s) / ED Diagnoses Final diagnoses:  Left lower quadrant abdominal pain  Diverticulitis    Rx / DC Orders ED Discharge Orders          Ordered    ondansetron (ZOFRAN-ODT) 4 MG disintegrating tablet  Every 8 hours PRN        10/18/22 2000    traMADol (ULTRAM) 50 MG tablet  Every 6 hours PRN        10/18/22 2000    ciprofloxacin (CIPRO) 500 MG tablet  Every 12 hours        10/18/22  2000    metroNIDAZOLE (FLAGYL) 500 MG tablet  2 times daily  10/18/22 2000              Mardene Sayer, MD 10/18/22 2003

## 2022-10-18 NOTE — ED Notes (Signed)
Pt aware urine sample needed 

## 2022-10-19 ENCOUNTER — Other Ambulatory Visit (HOSPITAL_BASED_OUTPATIENT_CLINIC_OR_DEPARTMENT_OTHER): Payer: Self-pay

## 2022-10-19 NOTE — Patient Instructions (Addendum)
1.Go to pharmacy to see if you can return the [place] sensors and exchange them for the dexcom sensors. 2. Increase dose of Tresiba to 25 units and then wait 3 days,  If Morning blood sugars are still over 160, increase dose  by 2u and wait another 3 days to keep increase 2u every 3 days until morning readings are less than 160.  3.  Take Humalog 10-15 minutes before you eat

## 2022-10-22 ENCOUNTER — Other Ambulatory Visit (HOSPITAL_COMMUNITY): Payer: Self-pay

## 2022-10-27 ENCOUNTER — Telehealth: Payer: Self-pay | Admitting: Nutrition

## 2022-10-27 NOTE — Telephone Encounter (Signed)
LVM to call me to schedule pump training 

## 2022-10-28 ENCOUNTER — Other Ambulatory Visit (HOSPITAL_COMMUNITY): Payer: Self-pay

## 2022-10-29 ENCOUNTER — Other Ambulatory Visit: Payer: Self-pay | Admitting: Endocrinology

## 2022-10-29 ENCOUNTER — Other Ambulatory Visit: Payer: Self-pay | Admitting: Internal Medicine

## 2022-10-29 ENCOUNTER — Other Ambulatory Visit (HOSPITAL_COMMUNITY): Payer: Self-pay

## 2022-10-30 ENCOUNTER — Other Ambulatory Visit (HOSPITAL_BASED_OUTPATIENT_CLINIC_OR_DEPARTMENT_OTHER): Payer: Self-pay

## 2022-10-30 ENCOUNTER — Other Ambulatory Visit: Payer: Self-pay

## 2022-10-30 MED ORDER — CARVEDILOL 12.5 MG PO TABS
12.5000 mg | ORAL_TABLET | Freq: Two times a day (BID) | ORAL | 1 refills | Status: DC
  Filled 2022-10-30: qty 180, 90d supply, fill #0
  Filled 2023-01-28: qty 180, 90d supply, fill #1

## 2022-10-30 MED ORDER — GLIMEPIRIDE 4 MG PO TABS
4.0000 mg | ORAL_TABLET | Freq: Every day | ORAL | 3 refills | Status: DC
  Filled 2022-10-30: qty 90, 90d supply, fill #0
  Filled 2023-03-01: qty 90, 90d supply, fill #1

## 2022-11-03 ENCOUNTER — Other Ambulatory Visit: Payer: Self-pay

## 2022-11-03 ENCOUNTER — Encounter: Payer: 59 | Attending: Endocrinology | Admitting: Nutrition

## 2022-11-03 DIAGNOSIS — E0840 Diabetes mellitus due to underlying condition with diabetic neuropathy, unspecified: Secondary | ICD-10-CM

## 2022-11-03 NOTE — Patient Instructions (Signed)
Change sensor every 10 days Change transmitter every 3 months.  Call Dexcom if questions or if sensor falls off

## 2022-11-03 NOTE — Progress Notes (Signed)
Patient was trained on the use of the Dexcom G6 sensors.  His readings were linked to the app on his phone and his clarity app was linked to [facility].  He started a sensor which was placed on his right outer arm without difficulty.  We reviewed the difference with sensor readings and glucose readings and when to use a blood sugar reading.  He had no questions.  Appointment was scheduled for tomorrow for pump training

## 2022-11-04 ENCOUNTER — Encounter: Payer: 59 | Admitting: Nutrition

## 2022-11-04 DIAGNOSIS — E0840 Diabetes mellitus due to underlying condition with diabetic neuropathy, unspecified: Secondary | ICD-10-CM

## 2022-11-04 NOTE — Patient Instructions (Signed)
  REad over starter booklet and folow directions for how to bolus  Call OmniPod help line if questions.

## 2022-11-04 NOTE — Progress Notes (Signed)
Patient was trained on the use of the OmniPod 5 .  Settings were put in per Dr. Emelda Brothers written order: MN: 0.1u/hr, 6AM: 0.3u/hr. 11AM: 0.5u/hr, 7PM: 0.8u/hr,  ISF: 50, I/C; 1, target  110 with corrections over 120.  He filled a pod with Humalog insulin and attached the pod to his right arm.  This was linked to his Dexcom, and blood sugar was 115.  He had not eaten today.  Time was 11:30AM.   His OmniPod account was set up with user name: [email or web address], and pw: [id number].  He was linked to [facility] as well.   Written instructions were given for how to bolus, making sure he adds his blood sugar readings to the bolus calculator.  He redemonstrated this with putting in 5u for smaller meals, and 7u for larger meals.   He reported good understanding and had no final questions.

## 2022-11-06 ENCOUNTER — Telehealth: Payer: Self-pay | Admitting: Dietician

## 2022-11-06 NOTE — Telephone Encounter (Signed)
Patient left a message on our voice mail.  He states that he had a hypoglycemia event, was sweating and removed POD when he removed his shirt.  Phone then locked him out of Omnipod.  He also forgot his charger here this week.    Called patient.  He will pick up his charger next week.  Will leave at the front desk. Patient states that his sensor reading was currently 117 and is controlling it with oral medication.   Dexcom report reviewed.  Noted increased sensor readings after meals and no low events.  Instructed patient to call Omnipod tech support and if further problems to call our office next week.  Oran Rein, RD, LDN, CDCES

## 2022-11-11 ENCOUNTER — Telehealth: Payer: Self-pay | Admitting: Nutrition

## 2022-11-11 NOTE — Telephone Encounter (Signed)
LVM to call me to possibly come in today to see if we can unlock his PDM and get him back on the pump

## 2022-11-12 ENCOUNTER — Encounter (HOSPITAL_BASED_OUTPATIENT_CLINIC_OR_DEPARTMENT_OTHER): Payer: Self-pay | Admitting: Pediatrics

## 2022-11-12 ENCOUNTER — Emergency Department (HOSPITAL_BASED_OUTPATIENT_CLINIC_OR_DEPARTMENT_OTHER): Payer: 59

## 2022-11-12 ENCOUNTER — Other Ambulatory Visit (HOSPITAL_BASED_OUTPATIENT_CLINIC_OR_DEPARTMENT_OTHER): Payer: Self-pay

## 2022-11-12 ENCOUNTER — Emergency Department (HOSPITAL_BASED_OUTPATIENT_CLINIC_OR_DEPARTMENT_OTHER)
Admission: EM | Admit: 2022-11-12 | Discharge: 2022-11-12 | Disposition: A | Discharge status: Home / Self Care | Payer: 59 | Attending: Emergency Medicine | Admitting: Emergency Medicine

## 2022-11-12 ENCOUNTER — Other Ambulatory Visit: Payer: Self-pay

## 2022-11-12 DIAGNOSIS — Z794 Long term (current) use of insulin: Secondary | ICD-10-CM | POA: Insufficient documentation

## 2022-11-12 DIAGNOSIS — Z7982 Long term (current) use of aspirin: Secondary | ICD-10-CM | POA: Insufficient documentation

## 2022-11-12 DIAGNOSIS — N189 Chronic kidney disease, unspecified: Secondary | ICD-10-CM | POA: Diagnosis not present

## 2022-11-12 DIAGNOSIS — R509 Fever, unspecified: Secondary | ICD-10-CM | POA: Diagnosis not present

## 2022-11-12 DIAGNOSIS — K5732 Diverticulitis of large intestine without perforation or abscess without bleeding: Secondary | ICD-10-CM | POA: Insufficient documentation

## 2022-11-12 DIAGNOSIS — K5792 Diverticulitis of intestine, part unspecified, without perforation or abscess without bleeding: Secondary | ICD-10-CM | POA: Diagnosis not present

## 2022-11-12 DIAGNOSIS — I10 Essential (primary) hypertension: Secondary | ICD-10-CM | POA: Diagnosis not present

## 2022-11-12 DIAGNOSIS — R0902 Hypoxemia: Secondary | ICD-10-CM | POA: Diagnosis not present

## 2022-11-12 DIAGNOSIS — I2510 Atherosclerotic heart disease of native coronary artery without angina pectoris: Secondary | ICD-10-CM | POA: Diagnosis not present

## 2022-11-12 DIAGNOSIS — R1032 Left lower quadrant pain: Secondary | ICD-10-CM | POA: Diagnosis not present

## 2022-11-12 DIAGNOSIS — R109 Unspecified abdominal pain: Secondary | ICD-10-CM

## 2022-11-12 DIAGNOSIS — I700 Atherosclerosis of aorta: Secondary | ICD-10-CM | POA: Diagnosis not present

## 2022-11-12 LAB — COMPREHENSIVE METABOLIC PANEL
ALT: 29 U/L (ref 0–44)
AST: 24 U/L (ref 15–41)
Albumin: 4.6 g/dL (ref 3.5–5.0)
Alkaline Phosphatase: 74 U/L (ref 38–126)
Anion gap: 8 (ref 5–15)
BUN: 25 mg/dL — ABNORMAL HIGH (ref 8–23)
CO2: 26 mmol/L (ref 22–32)
Calcium: 10.5 mg/dL — ABNORMAL HIGH (ref 8.9–10.3)
Chloride: 104 mmol/L (ref 98–111)
Creatinine, Ser: 1.93 mg/dL — ABNORMAL HIGH (ref 0.61–1.24)
GFR, Estimated: 38 mL/min — ABNORMAL LOW (ref >60)
Glucose, Bld: 192 mg/dL — ABNORMAL HIGH (ref 70–99)
Potassium: 4.8 mmol/L (ref 3.5–5.1)
Sodium: 138 mmol/L (ref 135–145)
Total Bilirubin: 1.5 mg/dL — ABNORMAL HIGH (ref 0.3–1.2)
Total Protein: 8.2 g/dL — ABNORMAL HIGH (ref 6.5–8.1)

## 2022-11-12 LAB — CBC
HCT: 47.2 % (ref 39.0–52.0)
Hemoglobin: 15.6 g/dL (ref 13.0–17.0)
MCH: 29 pg (ref 26.0–34.0)
MCHC: 33.1 g/dL (ref 30.0–36.0)
MCV: 87.7 fL (ref 80.0–100.0)
Platelets: 214 10*3/uL (ref 150–400)
RBC: 5.38 MIL/uL (ref 4.22–5.81)
RDW: 16.1 % — ABNORMAL HIGH (ref 11.5–15.5)
WBC: 7.3 10*3/uL (ref 4.0–10.5)
nRBC: 0 % (ref 0.0–0.2)

## 2022-11-12 LAB — URINALYSIS, ROUTINE W REFLEX MICROSCOPIC
Bilirubin Urine: NEGATIVE
Glucose, UA: NEGATIVE mg/dL
Hgb urine dipstick: NEGATIVE
Ketones, ur: NEGATIVE mg/dL
Leukocytes,Ua: NEGATIVE
Nitrite: NEGATIVE
Protein, ur: NEGATIVE mg/dL
Specific Gravity, Urine: 1.01 (ref 1.005–1.030)
pH: 5 (ref 5.0–8.0)

## 2022-11-12 LAB — LIPASE, BLOOD: Lipase: 35 U/L (ref 11–51)

## 2022-11-12 LAB — LACTIC ACID, PLASMA: Lactic Acid, Venous: 1.2 mmol/L (ref 0.5–1.9)

## 2022-11-12 MED ORDER — AMOXICILLIN-POT CLAVULANATE 875-125 MG PO TABS
1.0000 | ORAL_TABLET | Freq: Two times a day (BID) | ORAL | 0 refills | Status: DC
  Filled 2022-11-12: qty 20, 10d supply, fill #0

## 2022-11-12 MED ORDER — AMOXICILLIN-POT CLAVULANATE 875-125 MG PO TABS
1.0000 | ORAL_TABLET | Freq: Once | ORAL | Status: AC
  Administered 2022-11-12: 1 via ORAL
  Filled 2022-11-12: qty 1

## 2022-11-12 MED ORDER — SODIUM CHLORIDE 0.9 % IV BOLUS
500.0000 mL | Freq: Once | INTRAVENOUS | Status: AC
  Administered 2022-11-12: 500 mL via INTRAVENOUS

## 2022-11-12 MED ORDER — IOHEXOL 300 MG/ML  SOLN
100.0000 mL | Freq: Once | INTRAMUSCULAR | Status: AC | PRN
  Administered 2022-11-12: 100 mL via INTRAVENOUS

## 2022-11-12 MED ORDER — ONDANSETRON HCL 4 MG/2ML IJ SOLN
4.0000 mg | Freq: Once | INTRAMUSCULAR | Status: AC
  Administered 2022-11-12: 4 mg via INTRAVENOUS
  Filled 2022-11-12: qty 2

## 2022-11-12 MED ORDER — ONDANSETRON HCL 4 MG PO TABS
4.0000 mg | ORAL_TABLET | Freq: Four times a day (QID) | ORAL | 0 refills | Status: DC | PRN
  Filled 2022-11-12: qty 20, 5d supply, fill #0

## 2022-11-12 NOTE — ED Notes (Signed)
   11/12/22 1229  Resting  Supplemental oxygen during test? No  Resting Heart Rate 97  Resting Sp02 95  Lap 1 (250 feet)  HR 109  02 Sat 93   Ambulated to restroom with pulseox, -DOE, SpO2 93-95%

## 2022-11-12 NOTE — ED Triage Notes (Signed)
C/O fever, decreased appetite along with intermittent chills. States has hx of diverticulitis and feels tender of RLQ of his abdominal area.

## 2022-11-12 NOTE — ED Provider Notes (Signed)
[place] [facility] AT [facility] Provider Note   CSN: [id number] Arrival date & time: 11/12/22  1120     History  Chief Complaint  Patient presents with   Fever   Abdominal Pain    James [name] is a 67 y.o. male.  The history is provided by the patient and medical records. No language interpreter was used.  Fever Temp source:  Subjective Severity:  Moderate Onset quality:  Gradual Duration:  5 days Timing:  Intermittent Progression:  Waxing and waning Chronicity:  New Relieved by:  Nothing Worsened by:  Nothing Ineffective treatments:  None tried Associated symptoms: chills and nausea   Associated symptoms: no chest pain, no congestion, no cough, no diarrhea, no dysuria, no headaches, no rash, no rhinorrhea and no vomiting   Abdominal Pain Pain location:  LLQ Pain quality: aching   Pain radiates to:  Does not radiate Associated symptoms: chills, fatigue, fever and nausea   Associated symptoms: no chest pain, no constipation, no cough, no diarrhea, no dysuria, no shortness of breath and no vomiting        Home Medications Prior to Admission medications   Medication Sig Start Date End Date Taking? Authorizing Provider  acetaminophen (TYLENOL) 325 MG tablet Take 2 tablets (650 mg total) by mouth every 4 (four) hours as needed for headache or mild pain. 08/18/21   Leone Brand, NP  Adalimumab (HUMIRA, 2 PEN,) 40 MG/0.8ML PNKT Inject 40 mg (0.8 ml) under the skin every other week Patient taking differently: Inject 40 mg into the skin every 14 (fourteen) days. 07/06/22   Quentin Angst, MD  amLODipine (NORVASC) 5 MG tablet Take 1 tablet (5 mg total) by mouth daily. 12/15/21   Georgeanna Lea, MD  aspirin 81 MG chewable tablet Chew 1 tablet (81 mg total) by mouth daily. 08/19/21   Leone Brand, NP  atorvastatin (LIPITOR) 80 MG tablet Take 1 tablet (80 mg total) by mouth at bedtime. 09/30/22   Wanda Plump, MD  botulinum toxin Type A  (BOTOX) 200 units injection Provider to inject 155 units into the muscles of the head and neck every 3 months. Discard remainder. 10/12/22   Sater, Pearletha Furl, MD  carvedilol (COREG) 12.5 MG tablet Take 1 tablet (12.5 mg total) by mouth 2 (two) times daily with a meal. 10/30/22   Paz, Nolon Rod, MD  clobetasol (TEMOVATE) 0.05 % external solution Apply 1 application externally twice a day 14 days. Patient taking differently: Apply 1 Application topically 2 (two) times daily. 10/01/22     clonazePAM (KLONOPIN) 0.5 MG tablet Take 1 tablet (0.5 mg total) by mouth at bedtime. 10/12/22 04/10/23  Sater, Pearletha Furl, MD  clopidogrel (PLAVIX) 75 MG tablet TAKE 1 TABLET (75 MG TOTAL) BY MOUTH DAILY. 12/16/21 12/29/22  Georgeanna Lea, MD  Continuous Glucose Sensor (DEXCOM G6 SENSOR) MISC Use to monitor blood sugar, change after 10 days Patient taking differently: 1 each by Other route See admin instructions. Use to monitor blood sugar, change after 10 days 10/07/22   Reather Littler, MD  DULoxetine (CYMBALTA) 60 MG capsule Take 2 capsules (120 mg total) by mouth daily. 09/30/22   Wanda Plump, MD  EPINEPHrine (EPIPEN 2-PAK) 0.3 mg/0.3 mL IJ SOAJ injection Inject 0.3 mLs (0.3 mg total) into the muscle once as needed for up to 1 dose for anaphylaxis. 01/10/20   Wanda Plump, MD  fenofibrate micronized (LOFIBRA) 134 MG capsule Take 1 capsule (134 mg  total) by mouth 3 (three) times a week. 05/27/22 05/27/23  Reather Littler, MD  gabapentin (NEURONTIN) 600 MG tablet Take 1 tablet (600 mg total) by mouth 2 (two) times daily. 09/30/22   Wanda Plump, MD  glimepiride (AMARYL) 4 MG tablet Take 1 tablet (4 mg total) by mouth daily before breakfast. 10/30/22   Reather Littler, MD  Insulin Disposable Pump (OMNIPOD 5 G6 PODS, GEN 5,) MISC 1 Device by Does not apply route every 3 (three) days. 09/09/22   Reather Littler, MD  insulin lispro (HUMALOG) 100 UNIT/ML injection Use up to 60 Units daily in pump Patient taking differently: Inject 60 Units into the skin  See admin instructions. Use up to 60 Units daily in pump 10/07/22   Reather Littler, MD  Insulin Syringe-Needle U-100 (INSULIN SYRINGE 1CC/31GX5/16") 31G X 5/16" 1 ML MISC Use to administer Humalog 3 times a day Patient taking differently: 1 tablet by Other route See admin instructions. Use to administer Humalog 3 times a day 10/07/22   Reather Littler, MD  isosorbide mononitrate (IMDUR) 120 MG 24 hr tablet Take 1 tablet (120 mg total) by mouth daily. 07/14/22   Georgeanna Lea, MD  liraglutide (VICTOZA) 18 MG/3ML SOPN Start with 0.6mg  subcutaneously once a day for 7 days, then increase to 1.2mg  once a day Patient taking differently: Inject 0.6 mg into the skin every 7 (seven) days. Start with 0.6mg  subcutaneously once a day for 7 days, then increase to 1.2mg  once a day 06/18/22   Reather Littler, MD  metFORMIN (GLUCOPHAGE) 500 MG tablet Take 2 tablets (1,000 mg total) by mouth 2 (two) times daily. 10/06/22   Georgeanna Lea, MD  metroNIDAZOLE (FLAGYL) 500 MG tablet Take 1 tablet (500 mg total) by mouth 2 (two) times daily. 10/18/22   Mardene Sayer, MD  nitroGLYCERIN (NITROSTAT) 0.4 MG SL tablet Place 1 tablet (0.4 mg total) under the tongue every 5 (five) minutes for 3 doses as needed for chest pain. 06/26/21   Wanda Plump, MD  ondansetron (ZOFRAN-ODT) 4 MG disintegrating tablet Take 1 tablet (4 mg total) by mouth every 8 (eight) hours as needed. 10/18/22   Mardene Sayer, MD  pantoprazole (PROTONIX) 40 MG tablet Take 1 tablet (40 mg total) by mouth daily. 09/21/22   Wanda Plump, MD  traMADol (ULTRAM) 50 MG tablet Take 1 tablet (50 mg total) by mouth every 6 (six) hours as needed for up to 10 doses for severe pain. 10/18/22   Mardene Sayer, MD      Allergies    Bee venom, Hydrocodone bit-homatrop mbr, Toradol [ketorolac tromethamine], Prednisone, Sulfadiazine, Morphine and codeine, and Sulfa drugs cross reactors    Review of Systems   Review of Systems  Constitutional:  Positive for appetite  change, chills, fatigue and fever. Negative for diaphoresis.  HENT:  Negative for congestion and rhinorrhea.   Eyes:  Negative for visual disturbance.  Respiratory:  Negative for cough, chest tightness, shortness of breath and wheezing.   Cardiovascular:  Negative for chest pain and leg swelling.  Gastrointestinal:  Positive for abdominal pain and nausea. Negative for constipation, diarrhea and vomiting.  Genitourinary:  Negative for dysuria, flank pain and frequency.  Musculoskeletal:  Negative for back pain and neck pain.  Skin:  Negative for rash and wound.  Neurological:  Negative for light-headedness and headaches.  Psychiatric/Behavioral:  Negative for agitation.   All other systems reviewed and are negative.   Physical Exam Updated Vital Signs BP 118/88 (  BP Location: Left Arm)   Pulse 93   Temp 97.9 F (36.6 C) (Oral)   Resp 17   Ht 5\' 11"  (1.803 m)   Wt 113.4 kg   SpO2 98%   BMI 34.87 kg/m  Physical Exam Vitals and nursing note reviewed.  Constitutional:      General: He is not in acute distress.    Appearance: He is well-developed. He is not ill-appearing, toxic-appearing or diaphoretic.  HENT:     Head: Normocephalic and atraumatic.     Nose: No congestion or rhinorrhea.     Mouth/Throat:     Mouth: Mucous membranes are dry.     Pharynx: No oropharyngeal exudate or posterior oropharyngeal erythema.  Eyes:     Extraocular Movements: Extraocular movements intact.     Conjunctiva/sclera: Conjunctivae normal.     Pupils: Pupils are equal, round, and reactive to light.  Cardiovascular:     Rate and Rhythm: Normal rate and regular rhythm.     Heart sounds: No murmur heard. Pulmonary:     Effort: Pulmonary effort is normal. No respiratory distress.     Breath sounds: Normal breath sounds. No wheezing, rhonchi or rales.  Chest:     Chest wall: No tenderness.  Abdominal:     General: Abdomen is flat.     Palpations: Abdomen is soft.     Tenderness: There is  abdominal tenderness. There is no right CVA tenderness, left CVA tenderness, guarding or rebound.  Musculoskeletal:        General: No swelling or tenderness.     Cervical back: Neck supple. No tenderness.     Right lower leg: No edema.     Left lower leg: No edema.  Skin:    General: Skin is warm and dry.     Capillary Refill: Capillary refill takes less than 2 seconds.     Findings: No erythema or rash.  Neurological:     Mental Status: He is alert.  Psychiatric:        Mood and Affect: Mood normal.     ED Results / Procedures / Treatments   Labs (all labs ordered are listed, but only abnormal results are displayed) Labs Reviewed  COMPREHENSIVE METABOLIC PANEL - Abnormal; Notable for the following components:      Result Value   Glucose, Bld 192 (*)    BUN 25 (*)    Creatinine, Ser 1.93 (*)    Calcium 10.5 (*)    Total Protein 8.2 (*)    Total Bilirubin 1.5 (*)    GFR, Estimated 38 (*)    All other components within normal limits  CBC - Abnormal; Notable for the following components:   RDW 16.1 (*)    All other components within normal limits  LIPASE, BLOOD  LACTIC ACID, PLASMA  URINALYSIS, ROUTINE W REFLEX MICROSCOPIC    EKG EKG Interpretation  Date/Time:  Thursday Nov 12 2022 11:32:11 EDT Ventricular Rate:  83 PR Interval:  138 QRS Duration: 91 QT Interval:  360 QTC Calculation: 423 R Axis:   -13 Text Interpretation: Sinus rhythm Inferoposterior infarct, age indeterminate when compared top rior, similar appearnce with more wandering baseline. No STEMI Confirmed by Theda Belfast ([id number]) on 11/12/2022 12:22:18 PM  Radiology CT ABDOMEN PELVIS W CONTRAST  Result Date: 11/12/2022 CLINICAL DATA:  LLQ abdominal pain Chills, left lower quadrant abdominal pain, nausea, rule out recurrent diverticulitis EXAM: CT ABDOMEN AND PELVIS WITH CONTRAST TECHNIQUE: Multidetector CT imaging of the abdomen and pelvis was  performed using the standard protocol following bolus  administration of intravenous contrast. RADIATION DOSE REDUCTION: This exam was performed according to the departmental dose-optimization program which includes automated exposure control, adjustment of the mA and/or kV according to patient size and/or use of iterative reconstruction technique. CONTRAST:  OMNIPAQUE IOHEXOL 300 MG/ML  SOLN COMPARISON:  October 18, 2022 FINDINGS: Lower chest: No acute abnormality. Hepatobiliary: Cirrhotic liver morphology. Status post cholecystectomy. Portal vein is patent. Pancreas: Unremarkable. No pancreatic ductal dilatation or surrounding inflammatory changes. Spleen: Splenomegaly. Adrenals/Urinary Tract: Adrenal glands are unremarkable. Kidneys enhance symmetrically. No hydronephrosis. 4 mm nonobstructing RIGHT-sided nephrolithiasis. No obstructing nephrolithiasis. Bladder is decompressed with subjectively mildly prominent walls. Stomach/Bowel: No evidence of bowel obstruction. Mild circumferential bowel wall thickening of the sigmoid colon. Interval decrease of adjacent fat stranding. Diverticulosis predominately within the sigmoid colon and descending colon. Appendix is normal. Stomach is unremarkable for degree of decompression. Vascular/Lymphatic: Circumaortic LEFT renal vein. Atherosclerotic calcifications of the nonaneurysmal abdominal aorta. No new lymphadenopathy. Reproductive: Prostate is obscured by streak artifact from LEFT hip arthroplasty. Prostatomegaly. Other: VP shunt terminates in the LEFT lower quadrant. Trace free fluid. No focal drainable fluid collection. No free air. Musculoskeletal: Status post LEFT hip arthroplasty. Status post posterior fixation of the lower lumbar spine. IMPRESSION: 1. There is mild circumferential bowel wall thickening of the sigmoid colon with interval decrease in adjacent fat stranding. This could reflect sequela of prior or chronic diverticulitis. No focal drainable fluid collection or free air. Recommend correlation with colon  cancer screening history. 2. Cirrhotic liver morphology with sequela of portal hypertension including splenomegaly and trace ascites. 3. Mild circumferential wall prominence of the bladder which likely reflects a sequela of chronic outlet obstruction. Consider correlation with urinalysis. Aortic Atherosclerosis (ICD10-I70.0). Electronically Signed   By: Meda Klinefelter M.D.   On: 11/12/2022 13:12   DG Chest 2 View  Result Date: 11/12/2022 CLINICAL DATA:  Borderline hypoxia.  Chills.  Fever. EXAM: CHEST - 2 VIEW COMPARISON:  AP chest 08/15/2021, chest two views 09/17/2020 FINDINGS: Cardiac silhouette and mediastinal contours are within normal limits. The lungs are clear. No pleural effusion or pneumothorax. Mild-to-moderate multilevel degenerative disc changes of the thoracic spine. Harrington rods are again seen within the posterior aspect of the mid to upper thoracic spine. Catheter tubing from likely left ventriculoperitoneal shunt overlies the left neck, left hemithorax, and left upper abdomen, similar to prior. IMPRESSION: No active cardiopulmonary disease. Electronically Signed   By: Neita Garnet M.D.   On: 11/12/2022 12:59    Procedures Procedures    Medications Ordered in ED Medications  sodium chloride 0.9 % bolus 500 mL (has no administration in time range)    ED Course/ Medical Decision Making/ A&P Clinical Course as of 11/12/22 1541  Thu Nov 12, 2022  2527 Stable 67 year old male presenting with a chief complaint of abdominal pain and subjective fevers. Found to have diverticulitis on CT but also has questionable UTI findings. Planning to collect urine sample, likely start on Augmentin to treat both in the outpatient setting. [CC]    Clinical Course User Index [CC] Glyn Ade, MD                             Medical Decision Making Amount and/or Complexity of Data Reviewed Labs: ordered. Radiology: ordered.  Risk Prescription drug management.    James [name] is a 67 y.o. male with a past medical history significant for GERD,  previous diverticulitis, cirrhosis, CAD, posttraumatic hydrocephalus with VP shunt history, hyperlipidemia, kidney stones, CKD, and migraines who presents with subjective fevers, chills, nausea, and lower abdominal pain.  According to patient, for the last 5 days he has had pain in his left lower quadrant that feels similar to previous diverticulitis.  He reports no trauma and denies any constipation or diarrhea.  He reports decreased urine as he is not drinking as much due to the nausea and decreased appetite.  He denies vomiting.  Reports the pain is mild to moderate.  He reports some chills.  Denies any chest pain, short of breath, or cough but oxygen saturations were around 90% on room air initially.  He denies any flank pain or back pain.  Denies rashes.  Denies trauma.  On exam, lungs clear.  Chest nontender.  Abdomen was tender in the left lower quadrant primarily.  No rash seen.  Bowel sounds appreciated.  Good pulses in extremities.  Patient has dry mucous membranes.  Given his history of diverticulitis in the left lower quadrant abdominal pain, tenderness, nausea, fatigue, and chills, we will get a CT scan and labs.  With his borderline hypoxia will get a chest x-ray and ambulate with pulse oximetry to look for hypoxia.  Will get screening labs.  Anticipate reassessment after workup to determine disposition.  He reports he is not want pain medicine or nausea medicine initially.  Anticipate reassessment after workup.  3:41 PM Imaging shows evidence of diverticulitis on CT.  Also bladder wall thickening.  Will get urinalysis to make sure is not UTI as well however if urinalysis is reassuring and patient is well-appearing, dissipate discharge with antibiotics for the diverticulitis as well as prescription for nausea medicine and pain medicine.  Anticipate outpatient follow-up if he is improved.  Oxygen saturations remained  in the 90s during ambulation and at rest now.  X-ray did not show pneumonia.  Dissipate discharge after workup is completed.         Final Clinical Impression(s) / ED Diagnoses Final diagnoses:  Diverticulitis    Clinical Impression: 1. Diverticulitis     Disposition: Care transferred to oncoming team to wait for urinalysis and reassessment.  Anticipate discharge with antibiotics and medications for diverticulitis management if workup otherwise reassuring.  This note was prepared with assistance of Conservation officer, historic buildings. Occasional wrong-word or sound-a-like substitutions may have occurred due to the inherent limitations of voice recognition software.       Shaneque Merkle, Canary Brim, MD 11/12/22 [phone]

## 2022-11-12 NOTE — ED Provider Notes (Signed)
Care of patient received from prior provider at 3:35 PM, please see their note for complete H/P and care plan.  Received handoff per ED course.  Clinical Course as of 11/12/22 1535  Thu Nov 12, 2022  3680 Stable 67 year old male presenting with a chief complaint of abdominal pain and subjective fevers. Found to have diverticulitis on CT but also has questionable UTI findings. Planning to collect urine sample, likely start on Augmentin to treat both in the outpatient setting. [CC]    Clinical Course User Index [CC] Glyn Ade, MD    Reassessment: Reevaluated personally at bedside.  No acute distress.  CT scan shows findings of chronic diverticulitis.  Urinalysis nondiagnostic.  Shared medical decision making with patient.  He finished a course of Cipro Flagyl a month ago that did result in symptomatic resolution. Will broaden patient to Augmentin for 10 days and recommend close follow-up with his gastroenterologist within 10 days for further care and management. Patient ambulatory tolerating p.o. intake in no acute distress on reassessment feeling comfortable with outpatient care management.     Glyn Ade, MD 11/12/22 1625

## 2022-11-12 NOTE — ED Notes (Signed)
Unable to provide urine specimen at this time.  

## 2022-11-17 ENCOUNTER — Ambulatory Visit: Payer: 59 | Admitting: Endocrinology

## 2022-11-17 ENCOUNTER — Telehealth: Payer: Self-pay | Admitting: Nutrition

## 2022-11-17 NOTE — Telephone Encounter (Signed)
Patient had not been wearing pump, because it fell off on Day 2, and PDM would not unlock.  Also lost cord to controller, so it ran out of power on day 4.  Another cord given to him today. Said had not eaten anything for 5 days due to stomach flu.  No vomiting.  Weight down 13.5 pounds.  Blood sugars 91% time in range for last 7 days.  Has had 2 lows in the 60s with fingerstick.  One yesterday at [facility] and one the day before at 41.  These both required 8 ounces of Coke.  Felt weak now at 12:15PM.  Blood sugar was 72.  Pt. Told to stop one Metformin at HS.  He is currently taking 2 Metformin and one Amaryl at HS.  Says not able to cut Amaryl in half.   Per Dr. Remus Blake order, patient told to stay off pump until his blood sugars go over 200.  PDM was reset with a new unlocking code and date/time reset, and charged with new cord given.   Appointment rescheduled with dr. Lucianne Muss for 2 weeks.

## 2022-11-18 ENCOUNTER — Other Ambulatory Visit: Payer: Self-pay | Admitting: Cardiology

## 2022-11-18 ENCOUNTER — Other Ambulatory Visit (HOSPITAL_BASED_OUTPATIENT_CLINIC_OR_DEPARTMENT_OTHER): Payer: Self-pay

## 2022-11-18 ENCOUNTER — Telehealth: Payer: Self-pay | Admitting: Internal Medicine

## 2022-11-18 ENCOUNTER — Ambulatory Visit (INDEPENDENT_AMBULATORY_CARE_PROVIDER_SITE_OTHER): Payer: 59 | Admitting: Family

## 2022-11-18 VITALS — BP 121/80 | HR 86 | Temp 97.4°F | Resp 16 | Wt 241.0 lb

## 2022-11-18 DIAGNOSIS — K5730 Diverticulosis of large intestine without perforation or abscess without bleeding: Secondary | ICD-10-CM | POA: Diagnosis not present

## 2022-11-18 DIAGNOSIS — N3289 Other specified disorders of bladder: Secondary | ICD-10-CM | POA: Insufficient documentation

## 2022-11-18 DIAGNOSIS — K5792 Diverticulitis of intestine, part unspecified, without perforation or abscess without bleeding: Secondary | ICD-10-CM

## 2022-11-18 DIAGNOSIS — Z794 Long term (current) use of insulin: Secondary | ICD-10-CM

## 2022-11-18 LAB — COMPREHENSIVE METABOLIC PANEL
ALT: 22 U/L (ref 0–53)
AST: 20 U/L (ref 0–37)
Albumin: 4.2 g/dL (ref 3.5–5.2)
Alkaline Phosphatase: 65 U/L (ref 39–117)
BUN: 18 mg/dL (ref 6–23)
CO2: 21 mEq/L (ref 19–32)
Calcium: 9.6 mg/dL (ref 8.4–10.5)
Chloride: 104 mEq/L (ref 96–112)
Creatinine, Ser: 1.43 mg/dL (ref 0.40–1.50)
GFR: 51.71 mL/min — ABNORMAL LOW (ref >60.00)
Glucose, Bld: 169 mg/dL — ABNORMAL HIGH (ref 70–99)
Potassium: 4.5 mEq/L (ref 3.5–5.1)
Sodium: 137 mEq/L (ref 135–145)
Total Bilirubin: 1.1 mg/dL (ref 0.2–1.2)
Total Protein: 7.4 g/dL (ref 6.0–8.3)

## 2022-11-18 LAB — CBC WITH DIFFERENTIAL/PLATELET
Basophils Absolute: 0 10*3/uL (ref 0.0–0.1)
Basophils Relative: 0.5 % (ref 0.0–3.0)
Eosinophils Absolute: 0.1 10*3/uL (ref 0.0–0.7)
Eosinophils Relative: 1.1 % (ref 0.0–5.0)
HCT: 44.2 % (ref 39.0–52.0)
Hemoglobin: 14.6 g/dL (ref 13.0–17.0)
Lymphocytes Relative: 30.7 % (ref 12.0–46.0)
Lymphs Abs: 1.9 10*3/uL (ref 0.7–4.0)
MCHC: 33 g/dL (ref 30.0–36.0)
MCV: 88.7 fl (ref 78.0–100.0)
Monocytes Absolute: 0.4 10*3/uL (ref 0.1–1.0)
Monocytes Relative: 6.3 % (ref 3.0–12.0)
Neutro Abs: 3.8 10*3/uL (ref 1.4–7.7)
Neutrophils Relative %: 61.4 % (ref 43.0–77.0)
Platelets: 186 10*3/uL (ref 150.0–400.0)
RBC: 4.98 Mil/uL (ref 4.22–5.81)
RDW: 17.2 % — ABNORMAL HIGH (ref 11.5–15.5)
WBC: 6.2 10*3/uL (ref 4.0–10.5)

## 2022-11-18 NOTE — Telephone Encounter (Signed)
Patient's wife called requesting for the patient to be seen sooner that 08/02. States the patient has been in ED twice in the last month for diverticulitis. Also states the patient has lost 20 pounds in the last month and is still running a fever. Requesting a call back to discuss this further. Please advise, thank you.

## 2022-11-18 NOTE — Telephone Encounter (Signed)
LMTCB and also sent mychart

## 2022-11-18 NOTE — Assessment & Plan Note (Signed)
Some improvement in imaging but minimal clinical improvement.  I would like to get him back in with GI for further evaluation.  I am not confident that he took both cipro and flagyl following the first ED visit.  He will continue augmentin.  I have sent a note to Dr. Lamar Sprinkles team to see if they can work him in for a follow up appointment.

## 2022-11-18 NOTE — Telephone Encounter (Signed)
Pt scheduled to see Dr. Marina Goodell 12/21/22 at 9am, left message to call back.

## 2022-11-18 NOTE — Assessment & Plan Note (Signed)
He is low risk for bladder cancer as a non-smoker and UA negative for microhematuria.  Will monitor for now.

## 2022-11-18 NOTE — Progress Notes (Signed)
Subjective:     Patient ID: James [name], male    DOB: 31-Jul-1955, 67 y.o.   MRN: [id number]  Chief Complaint  Patient presents with   Follow-up    Here for ED follow up    HPI Patient is in today for follow up of his diverticulitis.  Pmhx is significant for previous hx of diverticulitis in 2014, obesity, psoriatic arthritis, NASH Cirrhosis, IIH, HTN, Diabetes type 2 and CAD.  He initially presented to the ER on 10/18/22 and was diagnosed with diverticulitis. CT noted : Segmental wall thickening and pericolonic fat stranding within the mid sigmoid colon, compatible with acute uncomplicated diverticulitis. This was the same area that we inflamed back on CT 2023.  The patient is followed by GI for cirrhosis. (Dr. Marina Goodell).ER records reveal that he was sent home with rx for cipro/flagyl and prn zofran. Upon questioning, the patient can't remember if he took 2 antibiotics.    He notes that pain did not improve following treatment.  He returned to the ER on 11/12/22.  At that time a CT scan was repeated.  CT noted:  There mild circumferential bowel wall thickening of the sigmoid colon with interval decrease in adjacent fat stranding. This could reflect sequela of prior or chronic diverticulitis. No focal drainable fluid collection or free air. Recommend correlation with colon cancer screening history. (Patient's last colonoscopy was 2021).  He was sent home with a 10 day supply of augmentin. He is about 6 days into his augmentin treatment.  Incidental note was also made of mild circumferential wall prominence of the bladder which likely reflects a sequela of chronic outlet obstruction. UA was clear in the ED.  He denies any issues with voiding. Last PSA 07/23/22 was normal. He is a never smoker, he denies hx of hematuria.   Lab Results  Component Value Date   PSA 0.45 07/23/2022   PSA 0.45 06/11/2020   PSA 0.45 11/16/2017      Health Maintenance Due  Topic Date Due   Zoster Vaccines- Shingrix  (1 of 2) Never done   OPHTHALMOLOGY EXAM  10/24/2021   FOOT EXAM  06/26/2022    Past Medical History:  Diagnosis Date   Abnormal cardiac CT angiography    Acid reflux    Annual physical exam 04/08/2015   Arthritis    Atypical chest pain 06/13/2020   Blood transfusion without reported diagnosis    Body mass index (BMI) 35.0-35.9, adult 04/05/2019   Chronic fatigue 01/28/2015   Chronic headaches    on cymbalta   Chronic migraine w/o aura, not intractable, w/o stat migr 10/24/2018   Cirrhosis (HCC)    Colon polyps    Complication of anesthesia    problems waking up from anesthesia   Coronary artery disease 01/23/2019   Depression    on cymbalta   Diabetes mellitus with neuropathy (HCC)    Diabetes with neuropathy 04/25/2013   Diverticulitis 03/2013   Dyslipidemia 05/29/2019   Eczema    Elevated LFTs    Epidural lipomatosis 10/05/2018   Essential (primary) hypertension 04/05/2019   Essential hypertension 10/10/2019   Fatty liver    GERD (gastroesophageal reflux disease) 04/28/2011   H/O craniotomy 05/07/2015   Headache 04/28/2011   Hepatitis 10/2017   NASH cirrhosis   History of kidney stones    Hyperlipidemia    Hypersomnia with sleep apnea 01/28/2015   Hypertension    IDA (iron deficiency anemia) 01/24/2019   Idiopathic intracranial hypertension 01/14/2017   Insomnia  04/26/2013   Kidney stone    Liver cirrhosis secondary to NASH (nonalcoholic steatohepatitis) (HCC) 01/02/2016   Low back pain 04/05/2019   Lower back injury 08/14/2019   Morbid obesity (HCC)    Neuromuscular disorder (HCC)    neuropathy   Neuropathy    Nonalcoholic steatohepatitis 10/05/2018   Obstructive hydrocephalus (HCC) 01/28/2015   OSA -- dx ~ 2012, cpap intolerant 09/04/2014    dx ~ 2012, cpap intolerant    PCP NOTES >>> 04/08/2015   Post-op pain 03/19/2019   Post-traumatic hydrocephalus (HCC)    s/p shunts x 2 (first got infected )   Presence of cerebrospinal fluid drainage device 07/28/2011   Psoriasis     sees Dr Lenis Noon   Psoriatic arthritis (HCC)    REM behavioral disorder 01/14/2017   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   see's Dr.Ortmann   Severe obesity (BMI >= 40) (HCC) 01/28/2015   SI (sacroiliac) joint dysfunction 08/14/2019   Sigmoid diverticulitis 04/25/2013   Sleep apnea    no CPAP      Spondylolisthesis, lumbar region 03/16/2019   Stomach ulcer    Testosterone deficiency 04/28/2011   VP (ventriculoperitoneal) shunt status 07/31/2020    Past Surgical History:  Procedure Laterality Date   AMPUTATION Left 12/27/2021   Procedure: AMPUTATION GREAT TOE;  Surgeon: Vivi Barrack, DPM;  Location: [facility] OR;  Service: Podiatry;  Laterality: Left;   BACK SURGERY  1980   BRAIN SURGERY     VP shunts placed in 2007   CHOLECYSTECTOMY N/A 08/25/2017   Procedure: LAPAROSCOPIC CHOLECYSTECTOMY WITH INTRAOPERATIVE CHOLANGIOGRAM;  Surgeon: Griselda Miner, MD;  Location: [facility] OR;  Service: General;  Laterality: N/A;   COLONOSCOPY     CORONARY STENT INTERVENTION N/A 09/27/2020   Procedure: CORONARY STENT INTERVENTION;  Surgeon: Corky Crafts, MD;  Location: [facility] INVASIVE CV LAB;  Service: Cardiovascular;  Laterality: N/A;   CORONARY ULTRASOUND/IVUS N/A 09/27/2020   Procedure: Intravascular Ultrasound/IVUS;  Surgeon: Corky Crafts, MD;  Location[facility] INVASIVE CV LAB;  Service: Cardiovascular;  Laterality: N/A;   JOINT REPLACEMENT     total hip   LEFT HEART CATH N/A 09/27/2020   Procedure: Left Heart Cath;  Surgeon: Corky Crafts, MD;  Location[facility] CV LAB;  Service: Cardiovascular;  Laterality: N/A;   LEFT HEART CATH AND CORONARY ANGIOGRAPHY N/A 09/24/2020   Procedure: LEFT HEART CATH AND CORONARY ANGIOGRAPHY;  Surgeon: Corky Crafts, MD;  Location: [facility] INVASIVE CV LAB;  Service: Cardiovascular;  Laterality: N/A;   LEFT HEART CATH AND CORONARY ANGIOGRAPHY N/A 08/18/2021   Procedure: LEFT HEART CATH AND CORONARY ANGIOGRAPHY;  Surgeon: Lennette Bihari, MD;   Location: [facility] INVASIVE CV LAB;  Service: Cardiovascular;  Laterality: N/A;   LUMBAR FUSION  03/16/2019   SHOULDER SURGERY Left 2010   TEE WITHOUT CARDIOVERSION N/A 01/01/2022   Procedure: TRANSESOPHAGEAL ECHOCARDIOGRAM (TEE);  Surgeon: Thomasene Ripple, DO;  Location: [facility] ENDOSCOPY;  Service: Cardiovascular;  Laterality: N/A;   TOE SURGERY Left 2018   TONSILLECTOMY     as a child   TOTAL HIP ARTHROPLASTY Left 2011   UPPER GASTROINTESTINAL ENDOSCOPY  01/04/2020   VENTRICULOPERITONEAL SHUNT  2007   x2    Family History  Problem Relation Age of Onset   Other Mother    Lung cancer Father        alive, former smoker    Heart disease Brother        MI age 87   Other  Brother        Murdered   Down syndrome Son    Diabetes Neg Hx    Prostate cancer Neg Hx    Colon cancer Neg Hx    Stomach cancer Neg Hx    Pancreatic cancer Neg Hx    Liver disease Neg Hx     Social History   Socioeconomic History   Marital status: Married    Spouse name: Burna Mortimer   Number of children: 2   Years of education: Not on file   Highest education level: Not on file  Occupational History   Occupation: disabled   Tobacco Use   Smoking status: Never   Smokeless tobacco: Never  Vaping Use   Vaping Use: Never used  Substance and Sexual Activity   Alcohol use: No   Drug use: No   Sexual activity: Yes    Partners: Female  Other Topics Concern   Not on file  Social History Narrative   Household-- pt , wife, one adult son with Down's syndrome   younger son lives in [place]   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   Social Determinants of Health   Financial Resource Strain: Low Risk  (03/18/2021)   Overall Financial Resource Strain (CARDIA)    Difficulty of Paying Living Expenses: Not hard at all  Food Insecurity: No Food Insecurity (01/22/2022)   Hunger Vital Sign    Worried About Running Out of Food in the Last Year: Never true    Ran Out of Food in the Last Year:  Never true  Transportation Needs: No Transportation Needs (01/22/2022)   PRAPARE - Administrator, Civil Service (Medical): No    Lack of Transportation (Non-Medical): No  Physical Activity: Inactive (03/18/2021)   Exercise Vital Sign    Days of Exercise per Week: 0 days    Minutes of Exercise per Session: 0 min  Stress: No Stress Concern Present (03/18/2021)   [employer] of Occupational Health - Occupational Stress Questionnaire    Feeling of Stress : Not at all  Social Connections: Moderately Integrated (03/18/2021)   Social Connection and Isolation Panel [NHANES]    Frequency of Communication with Friends and Family: More than three times a week    Frequency of Social Gatherings with Friends and Family: Once a week    Attends Religious Services: Never    Database administrator or Organizations: Yes    Attends Banker Meetings: 1 to 4 times per year    Marital Status: Married  Catering manager Violence: Not At Risk (01/22/2022)   Humiliation, Afraid, Rape, and Kick questionnaire    Fear of Current or Ex-Partner: No    Emotionally Abused: No    Physically Abused: No    Sexually Abused: No    Outpatient Medications Prior to Visit  Medication Sig Dispense Refill   acetaminophen (TYLENOL) 325 MG tablet Take 2 tablets (650 mg total) by mouth every 4 (four) hours as needed for headache or mild pain.     Adalimumab (HUMIRA, 2 PEN,) 40 MG/0.8ML PNKT Inject 40 mg (0.8 ml) under the skin every other week (Patient taking differently: Inject 40 mg into the skin every 14 (fourteen) days.) 2 each 4   amLODipine (NORVASC) 5 MG tablet Take 1 tablet (5 mg total) by mouth daily. 90 tablet 3   amoxicillin-clavulanate (AUGMENTIN) 875-125 MG tablet Take 1 tablet by mouth every 12 (twelve) hours. 20 tablet 0  aspirin 81 MG chewable tablet Chew 1 tablet (81 mg total) by mouth daily.     atorvastatin (LIPITOR) 80 MG tablet Take 1 tablet (80 mg total) by mouth at bedtime. 90  tablet 1   botulinum toxin Type A (BOTOX) 200 units injection Provider to inject 155 units into the muscles of the head and neck every 3 months. Discard remainder. 1 each 2   carvedilol (COREG) 12.5 MG tablet Take 1 tablet (12.5 mg total) by mouth 2 (two) times daily with a meal. 180 tablet 1   clobetasol (TEMOVATE) 0.05 % external solution Apply 1 application externally twice a day 14 days. (Patient taking differently: Apply 1 Application topically 2 (two) times daily.) 50 mL 1   clonazePAM (KLONOPIN) 0.5 MG tablet Take 1 tablet (0.5 mg total) by mouth at bedtime. 30 tablet 5   clopidogrel (PLAVIX) 75 MG tablet TAKE 1 TABLET (75 MG TOTAL) BY MOUTH DAILY. 90 tablet 3   Continuous Glucose Sensor (DEXCOM G6 SENSOR) MISC Use to monitor blood sugar, change after 10 days (Patient taking differently: 1 each by Other route See admin instructions. Use to monitor blood sugar, change after 10 days) 3 each 3   DULoxetine (CYMBALTA) 60 MG capsule Take 2 capsules (120 mg total) by mouth daily. 180 capsule 1   EPINEPHrine (EPIPEN 2-PAK) 0.3 mg/0.3 mL IJ SOAJ injection Inject 0.3 mLs (0.3 mg total) into the muscle once as needed for up to 1 dose for anaphylaxis. 2 each 2   fenofibrate micronized (LOFIBRA) 134 MG capsule Take 1 capsule (134 mg total) by mouth 3 (three) times a week. 15 capsule 3   gabapentin (NEURONTIN) 600 MG tablet Take 1 tablet (600 mg total) by mouth 2 (two) times daily. 180 tablet 1   glimepiride (AMARYL) 4 MG tablet Take 1 tablet (4 mg total) by mouth daily before breakfast. 90 tablet 3   Insulin Disposable Pump (OMNIPOD 5 G6 PODS, GEN 5,) MISC 1 Device by Does not apply route every 3 (three) days. 10 each 3   insulin lispro (HUMALOG) 100 UNIT/ML injection Use up to 60 Units daily in pump (Patient taking differently: Inject 60 Units into the skin See admin instructions. Use up to 60 Units daily in pump) 20 mL 2   Insulin Syringe-Needle U-100 (INSULIN SYRINGE 1CC/31GX5/16") 31G X 5/16" 1 ML MISC  Use to administer Humalog 3 times a day (Patient taking differently: 1 tablet by Other route See admin instructions. Use to administer Humalog 3 times a day) 100 each 0   isosorbide mononitrate (IMDUR) 120 MG 24 hr tablet Take 1 tablet (120 mg total) by mouth daily. 90 tablet 3   liraglutide (VICTOZA) 18 MG/3ML SOPN Start with 0.6mg  subcutaneously once a day for 7 days, then increase to 1.2mg  once a day (Patient taking differently: Inject 0.6 mg into the skin every 7 (seven) days. Start with 0.6mg  subcutaneously once a day for 7 days, then increase to 1.2mg  once a day) 6 mL 2   metFORMIN (GLUCOPHAGE) 500 MG tablet Take 2 tablets (1,000 mg total) by mouth 2 (two) times daily. 120 tablet 0   metroNIDAZOLE (FLAGYL) 500 MG tablet Take 1 tablet (500 mg total) by mouth 2 (two) times daily. 14 tablet 0   nitroGLYCERIN (NITROSTAT) 0.4 MG SL tablet Place 1 tablet (0.4 mg total) under the tongue every 5 (five) minutes for 3 doses as needed for chest pain. 25 tablet 6   ondansetron (ZOFRAN) 4 MG tablet Take 1 tablet (4 mg  total) by mouth every 6 (six) hours as needed for nausea or vomiting. 20 tablet 0   ondansetron (ZOFRAN-ODT) 4 MG disintegrating tablet Take 1 tablet (4 mg total) by mouth every 8 (eight) hours as needed. 20 tablet 0   pantoprazole (PROTONIX) 40 MG tablet Take 1 tablet (40 mg total) by mouth daily. 90 tablet 1   traMADol (ULTRAM) 50 MG tablet Take 1 tablet (50 mg total) by mouth every 6 (six) hours as needed for up to 10 doses for severe pain. 10 tablet 0   No facility-administered medications prior to visit.    Allergies  Allergen Reactions   Bee Venom Anaphylaxis   Hydrocodone Bit-Homatrop Mbr Other (See Comments)    Hallucinations, confusion, delirium Depressed feeling   Toradol [Ketorolac Tromethamine] Other (See Comments)    Hallucinations, confusion, delirium   Prednisone     Patient reports it causes cirrhosis to flare up   Sulfadiazine     NDC [id number] NDC  [id number] NDC [id number]   Morphine And Codeine Other (See Comments)    Hallucinations, back in the 80s. States has taken vicodin before w/o problems    Sulfa Drugs Cross Reactors Rash    ROS See HPI    Objective:    Physical Exam Constitutional:      General: He is not in acute distress.    Appearance: He is well-developed.  HENT:     Head: Normocephalic and atraumatic.  Cardiovascular:     Rate and Rhythm: Normal rate and regular rhythm.     Heart sounds: No murmur heard. Pulmonary:     Effort: Pulmonary effort is normal. No respiratory distress.     Breath sounds: Normal breath sounds. No wheezing or rales.  Abdominal:     General: Bowel sounds are normal. There is no distension.     Palpations: Abdomen is soft.     Tenderness: There is abdominal tenderness (epigastric and LLQ). There is no guarding or rebound.     Hernia: A hernia (rectus diastasis) is present.  Skin:    General: Skin is warm and dry.  Neurological:     Mental Status: He is alert and oriented to person, place, and time.  Psychiatric:        Behavior: Behavior normal.        Thought Content: Thought content normal.     BP 121/80 (BP Location: Right Arm, Patient Position: Sitting, Cuff Size: Small)   Pulse 86   Temp (!) 97.4 F (36.3 C) (Oral)   Resp 16   Wt 241 lb (109.3 kg)   SpO2 98%   BMI 33.61 kg/m  Wt Readings from Last 3 Encounters:  11/18/22 241 lb (109.3 kg)  11/12/22 250 lb (113.4 kg)  10/18/22 250 lb (113.4 kg)       Assessment & Plan:   Problem List Items Addressed This Visit       Unprioritized   Diverticulosis of colon    Some improvement in imaging but minimal clinical improvement.  I would like to get him back in with GI for further evaluation.  I am not confident that he took both cipro and flagyl following the first ED visit.  He will continue augmentin.  I have sent a note to Dr. Lamar Sprinkles team to see if they can work him in for a follow up appointment.        Diverticulitis - Primary   Relevant Orders   CBC w/Diff   Comp Met (CMET)   Bladder  wall thickening    He is low risk for bladder cancer as a non-smoker and UA negative for microhematuria.  Will monitor for now.         I am having Randye Lobo "[name]" maintain his EPINEPHrine, nitroGLYCERIN, acetaminophen, aspirin, clopidogrel, amLODipine, fenofibrate micronized, Victoza, Humira (2 Pen), isosorbide mononitrate, Omnipod 5 G6 Pods (Gen 5), pantoprazole, DULoxetine, gabapentin, atorvastatin, clobetasol, metFORMIN, Dexcom G6 Sensor, insulin lispro, INSULIN SYRINGE 1CC/31GX5/16", clonazePAM, botulinum toxin Type A, ondansetron, traMADol, metroNIDAZOLE, carvedilol, glimepiride, ondansetron, and amoxicillin-clavulanate.  No orders of the defined types were placed in this encounter.

## 2022-11-19 NOTE — Telephone Encounter (Signed)
LDTVM  

## 2022-11-19 NOTE — Telephone Encounter (Signed)
Pt scheduled to see Mike Gip PA  11/26/22 at 9:30am. Left detailed message for pt regarding appt, requested he call back to confirm appt.

## 2022-11-19 NOTE — Telephone Encounter (Signed)
Patient called and confirmed the appointment for 5/30.

## 2022-11-19 NOTE — Telephone Encounter (Signed)
LMTRC  JMiller,RMA 

## 2022-11-20 NOTE — Telephone Encounter (Signed)
LMTCB

## 2022-11-24 ENCOUNTER — Telehealth: Payer: Self-pay | Admitting: Family

## 2022-11-24 ENCOUNTER — Encounter: Payer: Self-pay | Admitting: Family

## 2022-11-24 NOTE — Telephone Encounter (Signed)
Left message on voicemail asking pt to send [place] a mychart message to let us know how he is feeling.

## 2022-11-26 ENCOUNTER — Ambulatory Visit: Payer: 59 | Admitting: Physician Assistant

## 2022-11-30 ENCOUNTER — Other Ambulatory Visit: Payer: Self-pay | Admitting: *Deleted

## 2022-11-30 ENCOUNTER — Other Ambulatory Visit: Payer: Self-pay | Admitting: Endocrinology

## 2022-11-30 ENCOUNTER — Telehealth: Payer: Self-pay | Admitting: Internal Medicine

## 2022-11-30 DIAGNOSIS — K7460 Unspecified cirrhosis of liver: Secondary | ICD-10-CM

## 2022-11-30 NOTE — Telephone Encounter (Signed)
Patient is returning a call. °

## 2022-12-01 ENCOUNTER — Other Ambulatory Visit (HOSPITAL_COMMUNITY): Payer: Self-pay

## 2022-12-01 ENCOUNTER — Other Ambulatory Visit (HOSPITAL_BASED_OUTPATIENT_CLINIC_OR_DEPARTMENT_OTHER): Payer: Self-pay

## 2022-12-01 MED ORDER — FENOFIBRATE MICRONIZED 134 MG PO CAPS
134.0000 mg | ORAL_CAPSULE | ORAL | 3 refills | Status: DC
  Filled 2022-12-01: qty 15, 35d supply, fill #0
  Filled 2022-12-29: qty 15, 35d supply, fill #1
  Filled 2023-01-28: qty 15, 35d supply, fill #2
  Filled 2023-03-15: qty 15, 35d supply, fill #3

## 2022-12-02 ENCOUNTER — Telehealth: Payer: Self-pay | Admitting: *Deleted

## 2022-12-02 ENCOUNTER — Ambulatory Visit (INDEPENDENT_AMBULATORY_CARE_PROVIDER_SITE_OTHER): Payer: 59 | Admitting: Endocrinology

## 2022-12-02 ENCOUNTER — Encounter: Payer: Self-pay | Admitting: Endocrinology

## 2022-12-02 ENCOUNTER — Other Ambulatory Visit (HOSPITAL_BASED_OUTPATIENT_CLINIC_OR_DEPARTMENT_OTHER): Payer: Self-pay

## 2022-12-02 VITALS — BP 116/88 | HR 75 | Ht 71.0 in | Wt 251.0 lb

## 2022-12-02 DIAGNOSIS — N1831 Chronic kidney disease, stage 3a: Secondary | ICD-10-CM

## 2022-12-02 DIAGNOSIS — E1165 Type 2 diabetes mellitus with hyperglycemia: Secondary | ICD-10-CM | POA: Diagnosis not present

## 2022-12-02 DIAGNOSIS — Z794 Long term (current) use of insulin: Secondary | ICD-10-CM

## 2022-12-02 MED ORDER — EMPAGLIFLOZIN 10 MG PO TABS
10.0000 mg | ORAL_TABLET | Freq: Every day | ORAL | 3 refills | Status: DC
  Filled 2022-12-02: qty 30, 30d supply, fill #0
  Filled 2022-12-29: qty 30, 30d supply, fill #1
  Filled 2023-01-28: qty 30, 30d supply, fill #2
  Filled 2023-02-25: qty 30, 30d supply, fill #3

## 2022-12-02 NOTE — Progress Notes (Signed)
Patient ID: James [name], male   DOB: 04/11/1956, 67 y.o.   MRN: [id number]           Reason for Appointment: Follow-up for Type 2 Diabetes   History of Present Illness:          Date of diagnosis of type 2 diabetes mellitus:  ?  2014      Background history:  He is not clear how his diabetes was diagnosed, likely on routine testing. Initially had been treated with metformin and also tried on Amaryl Patient had progressive increase in his blood sugars since 1/17 with stopping metformin and being on the regimen of Amaryl and Januvia, he thinks his blood sugars went up to 601.  He was then started on basal bolus insulin   Lowest A1c was 6.8 previously  OMNIPOD insulin pump settings previously as follows Basal rates: 12 AM-6 AM = 0.1, 6 AM-11 AM = 0.3, 11 AM- 7 PM = 0.5 and 7 PM = 0.8 Correction 1: 50 carbohydrate coverage 1: 1  Recent history:    Non-insulin hypoglycemic drugs the patient is taking are:  metformin 1000 mg a.m., 1000 mg p.m. daily, Amaryl 4 mg dinner  His A1c is last higher at 7.7 compared to 7.1  Current management, blood sugar patterns and problems identified:  He was started back on the OmniPod 5 insulin pump about 2 to 3 weeks ago when he was recovering from gastroenteritis However he appears to have used the pump only sporadically and only appears to be active between 5/29 and 6/1  Also he was not in the automated mode except for 1 day  Is also not getting a connection with his Dexcom to his phone app recently despite changing the sensor yesterday However his blood sugars have been going up significantly in the afternoons and late evenings based on his diet He has sporadically done boluses only on 2 days 13 and 14 units at lunchtime and once in the evening He is drinking regular Powerade throughout the day and also regular soft drinks at different times Overall average blood sugar on his CGM recently is 195 with only 46% in target range   Previous  data:  CGM use % of time 63  2-week average/GV 191  Time in range      49  %  % Time Above 180 34  % Time above 250 17  % Time Below 70      PRE-MEAL Fasting Lunch Dinner Bedtime Overall  Glucose range:       Averages: 146       POST-MEAL PC Breakfast PC Lunch PC Dinner  Glucose range:     Averages:  218 245     Side  effects from medications have been: None  Compliance with the medical regimen: Fair  Glucose monitoring:   Glucometer:  Freestyle libre 14-day   Self-care:   Meal times are:  Breakfast variable, lunch: 12-3 PM Dinner: 6 PM    Dietician visit, most recent:09/2015               CDE consultation: 12/2018  Weight history:  Wt Readings from Last 3 Encounters:  12/02/22 251 lb (113.9 kg)  11/18/22 241 lb (109.3 kg)  11/12/22 250 lb (113.4 kg)    Glycemic control:   Lab Results  Component Value Date   HGBA1C 7.7 (H) 09/01/2022   HGBA1C 7.1 (H) 05/11/2022   HGBA1C 7.1 (H) 01/30/2022   Lab Results  Component Value Date   MICROALBUR 2.1 (H) 01/30/2022   LDLCALC 68 01/30/2022   CREATININE 1.43 11/18/2022    No visits with results within 1 Week(s) from this visit.  Latest known visit with results is:  Office Visit on 11/18/2022  Component Date Value Ref Range Status   WBC 11/18/2022 6.2  4.0 - 10.5 K/uL Final   RBC 11/18/2022 4.98  4.22 - 5.81 Mil/uL Final   Hemoglobin 11/18/2022 14.6  13.0 - 17.0 g/dL Final   HCT 40/98/1191 44.2  39.0 - 52.0 % Final   MCV 11/18/2022 88.7  78.0 - 100.0 fl Final   MCHC 11/18/2022 33.0  30.0 - 36.0 g/dL Final   RDW 47/82/9562 17.2 (H)  11.5 - 15.5 % Final   Platelets 11/18/2022 186.0  150.0 - 400.0 K/uL Final   Neutrophils Relative % 11/18/2022 61.4  43.0 - 77.0 % Final   Lymphocytes Relative 11/18/2022 30.7  12.0 - 46.0 % Final   Monocytes Relative 11/18/2022 6.3  3.0 - 12.0 % Final   Eosinophils Relative 11/18/2022 1.1  0.0 - 5.0 % Final   Basophils Relative 11/18/2022 0.5  0.0 - 3.0 % Final   Neutro Abs  11/18/2022 3.8  1.4 - 7.7 K/uL Final   Lymphs Abs 11/18/2022 1.9  0.7 - 4.0 K/uL Final   Monocytes Absolute 11/18/2022 0.4  0.1 - 1.0 K/uL Final   Eosinophils Absolute 11/18/2022 0.1  0.0 - 0.7 K/uL Final   Basophils Absolute 11/18/2022 0.0  0.0 - 0.1 K/uL Final   Sodium 11/18/2022 137  135 - 145 mEq/L Final   Potassium 11/18/2022 4.5  3.5 - 5.1 mEq/L Final   Chloride 11/18/2022 104  96 - 112 mEq/L Final   CO2 11/18/2022 21  19 - 32 mEq/L Final   Glucose, Bld 11/18/2022 169 (H)  70 - 99 mg/dL Final   BUN 13/01/6577 18  6 - 23 mg/dL Final   Creatinine, Ser 11/18/2022 1.43  0.40 - 1.50 mg/dL Final   Total Bilirubin 11/18/2022 1.1  0.2 - 1.2 mg/dL Final   Alkaline Phosphatase 11/18/2022 65  39 - 117 U/L Final   AST 11/18/2022 20  0 - 37 U/L Final   ALT 11/18/2022 22  0 - 53 U/L Final   Total Protein 11/18/2022 7.4  6.0 - 8.3 g/dL Final   Albumin 46/96/2952 4.2  3.5 - 5.2 g/dL Final   GFR 84/13/2440 51.71 (L)  >60.00 mL/min Final   Calculated using the CKD-EPI Creatinine Equation (2021)   Calcium 11/18/2022 9.6  8.4 - 10.5 mg/dL Final       Allergies as of 12/02/2022       Reactions   Bee Venom Anaphylaxis   Hydrocodone Bit-homatrop Mbr Other (See Comments)   Hallucinations, confusion, delirium Depressed feeling   Toradol [ketorolac Tromethamine] Other (See Comments)   Hallucinations, confusion, delirium   Prednisone    Patient reports it causes cirrhosis to flare up   Sulfadiazine    NDC NUUV:[id number] NDC QQVZ:[id number] NDC RJJO:[id number]   Morphine And Codeine Other (See Comments)   Hallucinations, back in the 80s. States has taken vicodin before w/o problems    Sulfa Drugs Cross Reactors Rash        Medication List        Accurate as of December 02, 2022  2:30 PM. If you have any questions, ask your nurse or doctor.          acetaminophen 325 MG tablet Commonly known as: TYLENOL Take  2 tablets (650 mg total) by mouth every 4 (four) hours as needed for  headache or mild pain.   amLODipine 5 MG tablet Commonly known as: NORVASC Take 1 tablet (5 mg total) by mouth daily.   amoxicillin-clavulanate 875-125 MG tablet Commonly known as: AUGMENTIN Take 1 tablet by mouth every 12 (twelve) hours.   aspirin 81 MG chewable tablet Chew 1 tablet (81 mg total) by mouth daily.   atorvastatin 80 MG tablet Commonly known as: LIPITOR Take 1 tablet (80 mg total) by mouth at bedtime.   Botox 200 units injection Generic drug: botulinum toxin Type A Provider to inject 155 units into the muscles of the head and neck every 3 months. Discard remainder.   carvedilol 12.5 MG tablet Commonly known as: COREG Take 1 tablet (12.5 mg total) by mouth 2 (two) times daily with a meal.   clobetasol 0.05 % external solution Commonly known as: TEMOVATE Apply 1 application externally twice a day 14 days. What changed:  how much to take when to take this   clonazePAM 0.5 MG tablet Commonly known as: KLONOPIN Take 1 tablet (0.5 mg total) by mouth at bedtime.   clopidogrel 75 MG tablet Commonly known as: PLAVIX TAKE 1 TABLET (75 MG TOTAL) BY MOUTH DAILY.   Dexcom G6 Sensor Misc Use to monitor blood sugar, change after 10 days What changed:  how much to take how to take this when to take this   DULoxetine 60 MG capsule Commonly known as: CYMBALTA Take 2 capsules (120 mg total) by mouth daily.   empagliflozin 10 MG Tabs tablet Commonly known as: Jardiance Take 1 tablet (10 mg total) by mouth daily with breakfast. Started by: Reather Littler, MD   EPINEPHrine 0.3 mg/0.3 mL Soaj injection Commonly known as: EpiPen 2-Pak Inject 0.3 mLs (0.3 mg total) into the muscle once as needed for up to 1 dose for anaphylaxis.   fenofibrate micronized 134 MG capsule Commonly known as: LOFIBRA Take 1 capsule (134 mg total) by mouth 3 (three) times a week.   gabapentin 600 MG tablet Commonly known as: NEURONTIN Take 1 tablet (600 mg total) by mouth 2 (two) times  daily.   glimepiride 4 MG tablet Commonly known as: AMARYL Take 1 tablet (4 mg total) by mouth daily before breakfast.   HumaLOG 100 UNIT/ML injection Generic drug: insulin lispro Use up to 60 Units daily in pump What changed:  how much to take how to take this when to take this   Humira (2 Pen) 40 MG/0.8ML Pnkt Generic drug: Adalimumab Inject 40 mg (0.8 ml) under the skin every other week What changed:  how much to take how to take this when to take this   isosorbide mononitrate 120 MG 24 hr tablet Commonly known as: IMDUR Take 1 tablet (120 mg total) by mouth daily.   metFORMIN 500 MG tablet Commonly known as: GLUCOPHAGE Take 2 tablets (1,000 mg total) by mouth 2 (two) times daily.   metroNIDAZOLE 500 MG tablet Commonly known as: FLAGYL Take 1 tablet (500 mg total) by mouth 2 (two) times daily.   nitroGLYCERIN 0.4 MG SL tablet Commonly known as: NITROSTAT Place 1 tablet (0.4 mg total) under the tongue every 5 (five) minutes for 3 doses as needed for chest pain.   Omnipod 5 G6 Pods (Gen 5) Misc 1 Device by Does not apply route every 3 (three) days.   ondansetron 4 MG disintegrating tablet Commonly known as: ZOFRAN-ODT Take 1 tablet (4 mg total) by  mouth every 8 (eight) hours as needed.   ondansetron 4 MG tablet Commonly known as: ZOFRAN Take 1 tablet (4 mg total) by mouth every 6 (six) hours as needed for nausea or vomiting.   pantoprazole 40 MG tablet Commonly known as: PROTONIX Take 1 tablet (40 mg total) by mouth daily.   traMADol 50 MG tablet Commonly known as: ULTRAM Take 1 tablet (50 mg total) by mouth every 6 (six) hours as needed for up to 10 doses for severe pain.   UltiCare Insulin Syringe 31G X 5/16" 1 ML Misc Generic drug: Insulin Syringe-Needle U-100 Use to administer Humalog 3 times a day What changed:  how much to take how to take this when to take this   Victoza 18 MG/3ML Sopn Generic drug: liraglutide Start with 0.6mg  subcutaneously  once a day for 7 days, then increase to 1.2mg  once a day What changed:  how much to take how to take this when to take this        Allergies:  Allergies  Allergen Reactions   Bee Venom Anaphylaxis   Hydrocodone Bit-Homatrop Mbr Other (See Comments)    Hallucinations, confusion, delirium Depressed feeling   Toradol [Ketorolac Tromethamine] Other (See Comments)    Hallucinations, confusion, delirium   Prednisone     Patient reports it causes cirrhosis to flare up   Sulfadiazine     NDC ZOXW:[id number] NDC BJYN:[id number] NDC VHQI:[id number]   Morphine And Codeine Other (See Comments)    Hallucinations, back in the 80s. States has taken vicodin before w/o problems    Sulfa Drugs Cross Reactors Rash    Past Medical History:  Diagnosis Date   Abnormal cardiac CT angiography    Acid reflux    Annual physical exam 04/08/2015   Arthritis    Atypical chest pain 06/13/2020   Blood transfusion without reported diagnosis    Body mass index (BMI) 35.0-35.9, adult 04/05/2019   Chronic fatigue 01/28/2015   Chronic headaches    on cymbalta   Chronic migraine w/o aura, not intractable, w/o stat migr 10/24/2018   Cirrhosis (HCC)    Colon polyps    Complication of anesthesia    problems waking up from anesthesia   Coronary artery disease 01/23/2019   Depression    on cymbalta   Diabetes mellitus with neuropathy (HCC)    Diabetes with neuropathy 04/25/2013   Diverticulitis 03/2013   Dyslipidemia 05/29/2019   Eczema    Elevated LFTs    Epidural lipomatosis 10/05/2018   Essential (primary) hypertension 04/05/2019   Essential hypertension 10/10/2019   Fatty liver    GERD (gastroesophageal reflux disease) 04/28/2011   H/O craniotomy 05/07/2015   Headache 04/28/2011   Hepatitis 10/2017   NASH cirrhosis   History of kidney stones    Hyperlipidemia    Hypersomnia with sleep apnea 01/28/2015   Hypertension    IDA (iron deficiency anemia) 01/24/2019   Idiopathic intracranial  hypertension 01/14/2017   Insomnia 04/26/2013   Kidney stone    Liver cirrhosis secondary to NASH (nonalcoholic steatohepatitis) (HCC) 01/02/2016   Low back pain 04/05/2019   Lower back injury 08/14/2019   Morbid obesity (HCC)    Neuromuscular disorder (HCC)    neuropathy   Neuropathy    Nonalcoholic steatohepatitis 10/05/2018   Obstructive hydrocephalus (HCC) 01/28/2015   OSA -- dx ~ 2012, cpap intolerant 09/04/2014    dx ~ 2012, cpap intolerant    PCP NOTES >>> 04/08/2015   Post-op pain 03/19/2019   Post-traumatic hydrocephalus (HCC)  s/p shunts x 2 (first got infected )   Presence of cerebrospinal fluid drainage device 07/28/2011   Psoriasis    sees Dr Lenis Noon   Psoriatic arthritis (HCC)    REM behavioral disorder 01/14/2017   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   see's Dr.Ortmann   Severe obesity (BMI >= 40) (HCC) 01/28/2015   SI (sacroiliac) joint dysfunction 08/14/2019   Sigmoid diverticulitis 04/25/2013   Sleep apnea    no CPAP      Spondylolisthesis, lumbar region 03/16/2019   Stomach ulcer    Testosterone deficiency 04/28/2011   VP (ventriculoperitoneal) shunt status 07/31/2020    Past Surgical History:  Procedure Laterality Date   AMPUTATION Left 12/27/2021   Procedure: AMPUTATION GREAT TOE;  Surgeon: Vivi Barrack, DPM;  Location: [facility] OR;  Service: Podiatry;  Laterality: Left;   BACK SURGERY  1980   BRAIN SURGERY     VP shunts placed in 2007   CHOLECYSTECTOMY N/A 08/25/2017   Procedure: LAPAROSCOPIC CHOLECYSTECTOMY WITH INTRAOPERATIVE CHOLANGIOGRAM;  Surgeon: Griselda Miner, MD;  Location: [facility] OR;  Service: General;  Laterality: N/A;   COLONOSCOPY     CORONARY STENT INTERVENTION N/A 09/27/2020   Procedure: CORONARY STENT INTERVENTION;  Surgeon: Corky Crafts, MD;  Location: [facility] INVASIVE CV LAB;  Service: Cardiovascular;  Laterality: N/A;   CORONARY ULTRASOUND/IVUS N/A 09/27/2020   Procedure: Intravascular Ultrasound/IVUS;  Surgeon: Corky Crafts,  MD;  Location[facility] INVASIVE CV LAB;  Service: Cardiovascular;  Laterality: N/A;   JOINT REPLACEMENT     total hip   LEFT HEART CATH N/A 09/27/2020   Procedure: Left Heart Cath;  Surgeon: Corky Crafts, MD;  Location: [facility] INVASIVE CV LAB;  Service: Cardiovascular;  Laterality: N/A;   LEFT HEART CATH AND CORONARY ANGIOGRAPHY N/A 09/24/2020   Procedure: LEFT HEART CATH AND CORONARY ANGIOGRAPHY;  Surgeon: Corky Crafts, MD;  Location[facility] INVASIVE CV LAB;  Service: Cardiovascular;  Laterality: N/A;   LEFT HEART CATH AND CORONARY ANGIOGRAPHY N/A 08/18/2021   Procedure: LEFT HEART CATH AND CORONARY ANGIOGRAPHY;  Surgeon: Lennette Bihari, MD;  Location: [facility] INVASIVE CV LAB;  Service: Cardiovascular;  Laterality: N/A;   LUMBAR FUSION  03/16/2019   SHOULDER SURGERY Left 2010   TEE WITHOUT CARDIOVERSION N/A 01/01/2022   Procedure: TRANSESOPHAGEAL ECHOCARDIOGRAM (TEE);  Surgeon: Thomasene Ripple, DO;  Location: [facility] ENDOSCOPY;  Service: Cardiovascular;  Laterality: N/A;   TOE SURGERY Left 2018   TONSILLECTOMY     as a child   TOTAL HIP ARTHROPLASTY Left 2011   UPPER GASTROINTESTINAL ENDOSCOPY  01/04/2020   VENTRICULOPERITONEAL SHUNT  2007   x2    Family History  Problem Relation Age of Onset   Other Mother    Lung cancer Father        alive, former smoker    Heart disease Brother        MI age 53   Other Brother        Murdered   Down syndrome Son    Diabetes Neg Hx    Prostate cancer Neg Hx    Colon cancer Neg Hx    Stomach cancer Neg Hx    Pancreatic cancer Neg Hx    Liver disease Neg Hx     Social History:  reports that he has never smoked. He has never used smokeless tobacco. He reports that he does not drink alcohol and does not use drugs.    Review of Systems     The  following is a copy of the previous note  HYPERTENSION: On carvedilol with good control Hypertension managed by his PCP No home BP  BP Readings from Last 3 Encounters:  12/02/22 116/88  11/18/22 121/80   11/12/22 (!) 122/92   RENAL dysfunction: Has had mildly increased creatinine levels, again stable No history of microalbuminuria  Lab Results  Component Value Date   CREATININE 1.43 11/18/2022   CREATININE 1.93 (H) 11/12/2022   CREATININE 1.36 (H) 10/18/2022    Lipid history: He is on Lipitor 80 mg from his PCP  Is on fenofibrate 3 times a week for high triglycerides,  LDL is below 70 and triglycerides are normal    Lab Results  Component Value Date   CHOL 137 01/30/2022   CHOL 118 08/16/2021   CHOL 139 03/04/2021   Lab Results  Component Value Date   HDL 42.10 01/30/2022   HDL 43 08/16/2021   HDL 39.90 03/04/2021   Lab Results  Component Value Date   LDLCALC 68 01/30/2022   LDLCALC 55 08/16/2021   LDLCALC 69 03/04/2021   Lab Results  Component Value Date   TRIG 137.0 01/30/2022   TRIG 101 08/16/2021   TRIG 151.0 (H) 03/04/2021   Lab Results  Component Value Date   CHOLHDL 3 01/30/2022   CHOLHDL 2.7 08/16/2021   CHOLHDL 3 03/04/2021   Lab Results  Component Value Date   LDLDIRECT 67.0 12/31/2020   LDLDIRECT 91.0 06/11/2020   LDLDIRECT 83.0 10/09/2019            He is being followed by gastroenterology for liver cirrhosis secondary to [facility]  Lab Results  Component Value Date   ALT 22 11/18/2022    He has symptomatic painful neuropathy and is on gabapentin 600 mg twice a day and  Cymbalta 60 mg  Has been followed by gastroenterologist for NASH  Lab Results  Component Value Date   ALT 22 11/18/2022     Review of Systems    Physical Examination:  BP 116/88 (BP Location: Left Arm, Patient Position: Sitting, Cuff Size: Normal)   Pulse 75   Ht 5\' 11"  (1.803 m)   Wt 251 lb (113.9 kg)   SpO2 96%   BMI 35.01 kg/m       ASSESSMENT:  Diabetes type 2 with obesity  See history of present illness for discussion of current diabetes management, blood sugar patterns and problems identified  Currently on a regimen of AMARYL and Metformin  2000 mg a day  A1c is last higher at 7.7  He has used the OmniPod 5 insulin pump only for 3 days, previously this was on hold because of relatively low sugars when he had gastroenteritis Overall control is poor because of missed or inadequate boluses, regular soft drinks and other drinks with sugar  CKD: Relatively stable and mild with last GFR 52  PLAN:  He had a new sensor placed in today by the nurse educator and connected to the clarity app He will bring his PDM to make sure this is functioning properly and discussed using the automated mode consistently  Trial of JARDIANCE to help with his diabetes control, mild chronic kidney disease and weight loss Discussed that he will need to monitor his blood pressure was taking this and increase fluid intake With this he will stop his Amaryl Discussed action of SGLT 2 drugs on lowering glucose by decreasing kidney absorption of glucose, benefits of weight loss and lower blood pressure, possible side effects including candidiasis  and dosage regimen   For boluses he will need 15 to 20 units for any meal especially in the evening Stop all sweet drinks Follow-up in 3 to 4 weeks   Patient Instructions  15 -20 units bolus for all meals  No sweet drinks  Keep on automated mode all the time  Stop Glimeperide  Check BP with jardiance    Total visit time for evaluation and management and counseling = 30 minutes   Wendolyn Raso 12/02/2022, 2:30 PM

## 2022-12-02 NOTE — Telephone Encounter (Signed)
-----   Message from Chrystie Nose, RN sent at 11/30/2022  7:56 AM EDT ----- Regarding: FW: [place]  ----- Message ----- From: Chrystie Nose, RN Sent: 11/28/2022  12:00 AM EDT To: Chrystie Nose, RN Subject: [place]                                             Pt needs repeat US in 6 mths

## 2022-12-02 NOTE — Patient Instructions (Addendum)
15 -20 units bolus for all meals  No sweet drinks  Keep on automated mode all the time  Stop Glimeperide  Check BP with jardiance

## 2022-12-02 NOTE — Telephone Encounter (Signed)
Patient called and notified of [place] scheduling for tomorrow

## 2022-12-03 ENCOUNTER — Ambulatory Visit (HOSPITAL_COMMUNITY)
Admission: RE | Admit: 2022-12-03 | Discharge: 2022-12-03 | Disposition: A | Discharge status: Home / Self Care | Payer: 59 | Source: Ambulatory Visit | Attending: Internal Medicine | Admitting: Internal Medicine

## 2022-12-03 DIAGNOSIS — Z9049 Acquired absence of other specified parts of digestive tract: Secondary | ICD-10-CM | POA: Diagnosis not present

## 2022-12-03 DIAGNOSIS — K7581 Nonalcoholic steatohepatitis (NASH): Secondary | ICD-10-CM | POA: Diagnosis not present

## 2022-12-03 DIAGNOSIS — K7460 Unspecified cirrhosis of liver: Secondary | ICD-10-CM | POA: Diagnosis not present

## 2022-12-07 ENCOUNTER — Other Ambulatory Visit: Payer: Self-pay

## 2022-12-07 ENCOUNTER — Telehealth: Payer: Self-pay | Admitting: Neurology

## 2022-12-07 NOTE — Telephone Encounter (Signed)
Sent MyChart message asking for 2024 [employer] card.

## 2022-12-08 ENCOUNTER — Other Ambulatory Visit (INDEPENDENT_AMBULATORY_CARE_PROVIDER_SITE_OTHER): Payer: 59

## 2022-12-08 ENCOUNTER — Ambulatory Visit (INDEPENDENT_AMBULATORY_CARE_PROVIDER_SITE_OTHER): Payer: 59 | Admitting: Orthopaedic Surgery

## 2022-12-08 ENCOUNTER — Encounter: Payer: Self-pay | Admitting: Orthopaedic Surgery

## 2022-12-08 VITALS — Ht 70.75 in | Wt 250.8 lb

## 2022-12-08 DIAGNOSIS — M25561 Pain in right knee: Secondary | ICD-10-CM | POA: Diagnosis not present

## 2022-12-08 DIAGNOSIS — G8929 Other chronic pain: Secondary | ICD-10-CM

## 2022-12-08 MED ORDER — METHYLPREDNISOLONE ACETATE 40 MG/ML IJ SUSP
40.0000 mg | INTRAMUSCULAR | Status: AC | PRN
Start: 2022-12-08 — End: 2022-12-08
  Administered 2022-12-08: 40 mg via INTRA_ARTICULAR

## 2022-12-08 MED ORDER — LIDOCAINE HCL 1 % IJ SOLN
2.0000 mL | INTRAMUSCULAR | Status: AC | PRN
Start: 2022-12-08 — End: 2022-12-08
  Administered 2022-12-08: 2 mL

## 2022-12-08 MED ORDER — BUPIVACAINE HCL 0.5 % IJ SOLN
2.0000 mL | INTRAMUSCULAR | Status: AC | PRN
Start: 2022-12-08 — End: 2022-12-08
  Administered 2022-12-08: 2 mL via INTRA_ARTICULAR

## 2022-12-08 NOTE — Telephone Encounter (Signed)
Initiated new Botox auth via [employer] portal, status is pending. Ref #: [id number]

## 2022-12-08 NOTE — Progress Notes (Signed)
Office Visit Note   Patient: James [name]           Date of Birth: 11-12-1955           MRN: [id number] Visit Date: 12/08/2022              Requested by: Wanda Plump, MD [address] [place] [place],  [place] [address] PCP: Wanda Plump, MD   Assessment & Plan: Visit Diagnoses:  1. Chronic pain of right knee     Plan: Impression 67 year old gentleman with chronic right knee pain probable chondromalacia of the patellofemoral and lateral compartments.  Treatment options were discussed to include physical therapy, weight loss, cortisone injections.  Patient on Plavix therefore not a candidate for NSAIDs.  Patient elected to have cortisone injection today.  Will see him back as needed.  Follow-Up Instructions: No follow-ups on file.   Orders:  Orders Placed This Encounter  Procedures   XR KNEE 3 VIEW RIGHT   No orders of the defined types were placed in this encounter.     Procedures: Large Joint Inj: R knee on 12/08/2022 10:01 AM Indications: pain Details: 22 G needle  Arthrogram: No  Medications: 40 mg methylPREDNISolone acetate 40 MG/ML; 2 mL lidocaine 1 %; 2 mL bupivacaine 0.5 % Consent was given by the patient. Patient was prepped and draped in the usual sterile fashion.       Clinical Data: No additional findings.   Subjective: Chief Complaint  Patient presents with   Right Knee - Pain    HPI James [name] is a very pleasant 67 year old gentleman here for evaluation of chronic right knee pain more so on the lateral side.  Has occasional giving way.  Denies any locking.  Denies any injuries or trauma or previous surgeries.  It bothers him with weather changes and with driving.  Sometimes it feels like it hyperextends when he walks.  Aleve helps temporarily. Review of Systems  Constitutional: Negative.   HENT: Negative.    Eyes: Negative.   Respiratory: Negative.    Cardiovascular: Negative.   Gastrointestinal: Negative.   Endocrine: Negative.    Genitourinary: Negative.   Skin: Negative.   Allergic/Immunologic: Negative.   Neurological: Negative.   Hematological: Negative.   Psychiatric/Behavioral: Negative.    All other systems reviewed and are negative.    Objective: Vital Signs: Ht 5' 10.75" (1.797 m)   Wt 250 lb 12.8 oz (113.8 kg)   BMI 35.23 kg/m   Physical Exam Vitals and nursing note reviewed.  Constitutional:      Appearance: He is well-developed.  Pulmonary:     Effort: Pulmonary effort is normal.  Abdominal:     Palpations: Abdomen is soft.  Skin:    General: Skin is warm.  Neurological:     Mental Status: He is alert and oriented to person, place, and time.  Psychiatric:        Behavior: Behavior normal.        Thought Content: Thought content normal.        Judgment: Judgment normal.    Ortho Exam Examination right knee shows lateral joint line tenderness.  No joint effusion.  2+ patellofemoral crepitus with range of motion. Specialty Comments:  No specialty comments available.  Imaging: No results found.   PMFS History: Patient Active Problem List   Diagnosis Date Noted   Bladder wall thickening 11/18/2022   Long term (current) use of antibiotics 01/09/2022   MSSA bacteremia 12/27/2021  Acute renal failure superimposed on stage 3a chronic kidney disease (HCC) 12/26/2021   Osteomyelitis of great toe of left foot (HCC) 12/25/2021   Precordial chest pain 08/15/2021   Unstable angina (HCC) 08/15/2021   Pain in right hip 01/23/2021   Kidney stone    Hypertension    Diabetes mellitus with neuropathy (HCC)    CAD (coronary artery disease) 09/27/2020   Abnormal cardiac CT angiography    Stomach ulcer    Psoriasis    Post-traumatic hydrocephalus (HCC)    Neuropathy    Neuromuscular disorder (HCC)    Morbid obesity (HCC)    Hyperlipidemia    History of kidney stones    Fatty liver    Elevated LFTs    Eczema    Complication of anesthesia    Colon polyps    Cirrhosis (HCC)     Chronic headaches    Arthritis    Acid reflux    VP (ventriculoperitoneal) shunt status 07/31/2020   Atypical chest pain 06/13/2020   Essential hypertension 10/10/2019   Lower back injury 08/14/2019   SI (sacroiliac) joint dysfunction 08/14/2019   Dyslipidemia 05/29/2019   BMI 32.0-32.9,adult 04/05/2019   Low back pain 04/05/2019   Essential (primary) hypertension 04/05/2019   Spondylolisthesis, lumbar region 03/16/2019   IDA (iron deficiency anemia) 01/24/2019   Coronary artery disease 01/23/2019   Chronic migraine w/o aura, not intractable, w/o stat migr 10/24/2018   Epidural lipomatosis 10/05/2018   Nonalcoholic steatohepatitis 10/05/2018   History of MRI of lumbar spine 09/07/2018   Episodic confusion 09/07/2018   Hepatitis 10/2017   Idiopathic intracranial hypertension 01/14/2017   REM behavioral disorder 01/14/2017   Head trauma 11/19/2016   Liver cirrhosis secondary to NASH (nonalcoholic steatohepatitis) (HCC) 01/02/2016   H/O craniotomy 05/07/2015   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   Annual physical exam 04/08/2015   PCP NOTES >>> 04/08/2015   Hypersomnia with sleep apnea 01/28/2015   Severe obesity (BMI >= 40) (HCC) 01/28/2015   Obstructive hydrocephalus (HCC) 01/28/2015   Chronic fatigue 01/28/2015   Depression 09/04/2014   OSA -- dx ~ 2012, cpap intolerant 09/04/2014   Diverticulosis of colon 10/23/2013   Insomnia 04/26/2013   Sigmoid diverticulitis 04/25/2013   Diabetes with neuropathy 04/25/2013   Diverticulitis 03/2013   Presence of cerebrospinal fluid drainage device 07/28/2011   Psoriatic arthritis (HCC) 04/28/2011   Headache 04/28/2011   GERD (gastroesophageal reflux disease) 04/28/2011   Testosterone deficiency 04/28/2011   Urinary retention 04/28/2011   Past Medical History:  Diagnosis Date   Abnormal cardiac CT angiography    Acid reflux    Annual physical exam 04/08/2015   Arthritis    Atypical chest pain 06/13/2020    Blood transfusion without reported diagnosis    Body mass index (BMI) 35.0-35.9, adult 04/05/2019   Chronic fatigue 01/28/2015   Chronic headaches    on cymbalta   Chronic migraine w/o aura, not intractable, w/o stat migr 10/24/2018   Cirrhosis (HCC)    Colon polyps    Complication of anesthesia    problems waking up from anesthesia   Coronary artery disease 01/23/2019   Depression    on cymbalta   Diabetes mellitus with neuropathy (HCC)    Diabetes with neuropathy 04/25/2013   Diverticulitis 03/2013   Dyslipidemia 05/29/2019   Eczema    Elevated LFTs    Epidural lipomatosis 10/05/2018   Essential (primary) hypertension 04/05/2019   Essential hypertension 10/10/2019   Fatty liver    GERD (gastroesophageal  reflux disease) 04/28/2011   H/O craniotomy 05/07/2015   Headache 04/28/2011   Hepatitis 10/2017   NASH cirrhosis   History of kidney stones    Hyperlipidemia    Hypersomnia with sleep apnea 01/28/2015   Hypertension    IDA (iron deficiency anemia) 01/24/2019   Idiopathic intracranial hypertension 01/14/2017   Insomnia 04/26/2013   Kidney stone    Liver cirrhosis secondary to NASH (nonalcoholic steatohepatitis) (HCC) 01/02/2016   Low back pain 04/05/2019   Lower back injury 08/14/2019   Morbid obesity (HCC)    Neuromuscular disorder (HCC)    neuropathy   Neuropathy    Nonalcoholic steatohepatitis 10/05/2018   Obstructive hydrocephalus (HCC) 01/28/2015   OSA -- dx ~ 2012, cpap intolerant 09/04/2014    dx ~ 2012, cpap intolerant    PCP NOTES >>> 04/08/2015   Post-op pain 03/19/2019   Post-traumatic hydrocephalus (HCC)    s/p shunts x 2 (first got infected )   Presence of cerebrospinal fluid drainage device 07/28/2011   Psoriasis    sees Dr Lenis Noon   Psoriatic arthritis (HCC)    REM behavioral disorder 01/14/2017   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   see's Dr.Ortmann   Severe obesity (BMI >= 40) (HCC) 01/28/2015   SI (sacroiliac) joint dysfunction 08/14/2019   Sigmoid  diverticulitis 04/25/2013   Sleep apnea    no CPAP      Spondylolisthesis, lumbar region 03/16/2019   Stomach ulcer    Testosterone deficiency 04/28/2011   VP (ventriculoperitoneal) shunt status 07/31/2020    Family History  Problem Relation Age of Onset   Other Mother    Lung cancer Father        alive, former smoker    Heart disease Brother        MI age 7   Other Brother        Murdered   Down syndrome Son    Diabetes Neg Hx    Prostate cancer Neg Hx    Colon cancer Neg Hx    Stomach cancer Neg Hx    Pancreatic cancer Neg Hx    Liver disease Neg Hx     Past Surgical History:  Procedure Laterality Date   AMPUTATION Left 12/27/2021   Procedure: AMPUTATION GREAT TOE;  Surgeon: Vivi Barrack, DPM;  Location: [facility] OR;  Service: Podiatry;  Laterality: Left;   BACK SURGERY  1980   BRAIN SURGERY     VP shunts placed in 2007   CHOLECYSTECTOMY N/A 08/25/2017   Procedure: LAPAROSCOPIC CHOLECYSTECTOMY WITH INTRAOPERATIVE CHOLANGIOGRAM;  Surgeon: Griselda Miner, MD;  Location: [facility] OR;  Service: General;  Laterality: N/A;   COLONOSCOPY     CORONARY STENT INTERVENTION N/A 09/27/2020   Procedure: CORONARY STENT INTERVENTION;  Surgeon: Corky Crafts, MD;  Location: [facility] INVASIVE CV LAB;  Service: Cardiovascular;  Laterality: N/A;   CORONARY ULTRASOUND/IVUS N/A 09/27/2020   Procedure: Intravascular Ultrasound/IVUS;  Surgeon: Corky Crafts, MD;  Location: [facility] INVASIVE CV LAB;  Service: Cardiovascular;  Laterality: N/A;   JOINT REPLACEMENT     total hip   LEFT HEART CATH N/A 09/27/2020   Procedure: Left Heart Cath;  Surgeon: Corky Crafts, MD;  Location: [facility] INVASIVE CV LAB;  Service: Cardiovascular;  Laterality: N/A;   LEFT HEART CATH AND CORONARY ANGIOGRAPHY N/A 09/24/2020   Procedure: LEFT HEART CATH AND CORONARY ANGIOGRAPHY;  Surgeon: Corky Crafts, MD;  Location: [facility] INVASIVE CV LAB;  Service: Cardiovascular;  Laterality: N/A;   LEFT  HEART CATH AND CORONARY ANGIOGRAPHY  N/A 08/18/2021   Procedure: LEFT HEART CATH AND CORONARY ANGIOGRAPHY;  Surgeon: Lennette Bihari, MD;  Location: [facility] INVASIVE CV LAB;  Service: Cardiovascular;  Laterality: N/A;   LUMBAR FUSION  03/16/2019   SHOULDER SURGERY Left 2010   TEE WITHOUT CARDIOVERSION N/A 01/01/2022   Procedure: TRANSESOPHAGEAL ECHOCARDIOGRAM (TEE);  Surgeon: Thomasene Ripple, DO;  Location: [facility] ENDOSCOPY;  Service: Cardiovascular;  Laterality: N/A;   TOE SURGERY Left 2018   TONSILLECTOMY     as a child   TOTAL HIP ARTHROPLASTY Left 2011   UPPER GASTROINTESTINAL ENDOSCOPY  01/04/2020   VENTRICULOPERITONEAL SHUNT  2007   x2   Social History   Occupational History   Occupation: disabled   Tobacco Use   Smoking status: Never   Smokeless tobacco: Never  Vaping Use   Vaping Use: Never used  Substance and Sexual Activity   Alcohol use: No   Drug use: No   Sexual activity: Yes    Partners: Female

## 2022-12-10 NOTE — Telephone Encounter (Signed)
Received approval, ref # [id number] (12/09/22-12/09/23).

## 2022-12-21 ENCOUNTER — Telehealth: Payer: Self-pay | Admitting: Cardiology

## 2022-12-21 ENCOUNTER — Telehealth: Payer: Self-pay | Admitting: *Deleted

## 2022-12-21 ENCOUNTER — Other Ambulatory Visit (HOSPITAL_BASED_OUTPATIENT_CLINIC_OR_DEPARTMENT_OTHER): Payer: Self-pay

## 2022-12-21 ENCOUNTER — Other Ambulatory Visit: Payer: Self-pay | Admitting: Cardiology

## 2022-12-21 ENCOUNTER — Ambulatory Visit (INDEPENDENT_AMBULATORY_CARE_PROVIDER_SITE_OTHER): Payer: 59 | Admitting: Internal Medicine

## 2022-12-21 ENCOUNTER — Telehealth: Payer: Self-pay

## 2022-12-21 ENCOUNTER — Encounter: Payer: Self-pay | Admitting: Internal Medicine

## 2022-12-21 VITALS — BP 124/68 | HR 80 | Ht 71.0 in | Wt 245.6 lb

## 2022-12-21 DIAGNOSIS — K7460 Unspecified cirrhosis of liver: Secondary | ICD-10-CM | POA: Diagnosis not present

## 2022-12-21 DIAGNOSIS — K766 Portal hypertension: Secondary | ICD-10-CM | POA: Diagnosis not present

## 2022-12-21 DIAGNOSIS — Z86010 Personal history of colonic polyps: Secondary | ICD-10-CM

## 2022-12-21 DIAGNOSIS — K3189 Other diseases of stomach and duodenum: Secondary | ICD-10-CM | POA: Diagnosis not present

## 2022-12-21 DIAGNOSIS — K7581 Nonalcoholic steatohepatitis (NASH): Secondary | ICD-10-CM

## 2022-12-21 DIAGNOSIS — Z789 Other specified health status: Secondary | ICD-10-CM | POA: Diagnosis not present

## 2022-12-21 DIAGNOSIS — E1165 Type 2 diabetes mellitus with hyperglycemia: Secondary | ICD-10-CM

## 2022-12-21 MED ORDER — NA SULFATE-K SULFATE-MG SULF 17.5-3.13-1.6 GM/177ML PO SOLN
1.0000 | Freq: Once | ORAL | 0 refills | Status: AC
  Filled 2022-12-21 (×2): qty 354, 5d supply, fill #0

## 2022-12-21 NOTE — Telephone Encounter (Signed)
I left a message for the patient to call our office back to schedule a tele visit for pre-op.  

## 2022-12-21 NOTE — Telephone Encounter (Signed)
   Name: James [name]  DOB: 1955/08/04  MRN: [id number]  Primary Cardiologist: Gypsy Balsam, MD   Preoperative team, please contact this patient and set up a phone call appointment for further preoperative risk assessment. Please obtain consent and complete medication review. Thank you for your help.  I confirm that guidance regarding antiplatelet and oral anticoagulation therapy has been completed and, if necessary, noted below.  His Plavix may be held for 5 days prior to his procedure.  Please resume as soon as hemostasis is achieved.  His aspirin will need to be continued throughout the perioperative setting.   Ronney Asters, NP 12/21/2022, 2:23 PM [facility]

## 2022-12-21 NOTE — Progress Notes (Signed)
HISTORY OF PRESENT ILLNESS:  James [name] is a 67 y.o. male  with MULTIPLE SIGNIFICANT medical problems as listed below.  He has been followed in this office for history of compensated NASH cirrhosis, GERD, diverticulitis, and prior cholecystectomy.  He also has GAVE and a history of iron deficiency anemia secondary to the same.  He also has morbid obesity, coronary artery disease with stent placement, now on Plavix, and diabetes with complications including the need for left great toe amputation this summer.  Patient was last seen in this office May 26, 2022.  See that dictation.  The impression and plan from that visit is outlined as follows:   ASSESSMENT:   1.  NASH cirrhosis.  Compensated.  MELD score 10 2.  GAVE.  Associated iron deficiency anemia.  Corrected with iron replacement 3.  Upper endoscopy 2021.  No varices 4.  Colonoscopy 2021 with adenomatous polyps 5.  GERD.  Symptoms controlled with PPI 6.  Multiple significant medical problems including coronary artery disease on Plavix and diabetes mellitus with complications     PLAN:   1.  Exercise and weight loss 2.  Continue iron replacement 3.  Follow-up surveillance ultrasound of the liver to rule out HCC. 4.  Ongoing general medical care with Dr. Drue Novel 5.  Routine GI office follow-up 6 months.  At that time he will be due for surveillance colonoscopy and upper endoscopy.  Contact the office in the interim for any questions or problems  James [name] presents today for follow-up.  Previous colonoscopy in 2021 revealed multiple advanced adenomatous polyps.  Follow-up in 3 years recommended.  His last upper endoscopy was negative for varices.  He is due for surveillance of both examinations.  He is on Coreg for hypertension.  Resting heart rate 80, today.  Review of blood work from Nov 18, 2022 shows normal liver test.  Sodium 137.  CBC with hemoglobin 14.6.  Platelets 186,000.  INR 1 year ago 1.1.  Most recent abdominal ultrasound December 03, 2022 shows cirrhotic morphology of the liver.  However, no focal hepatic lesion he did undergo a CT scan May 2024.  Again, no focal hepatic thickening.  He did have changes of sigmoid diverticulitis.  He was having left lower quadrant pain at that time.  He was treated with antibiotics.  His pain has resolved.  He was told to follow-up in this office     REVIEW OF SYSTEMS:  All non-GI ROS negative unless otherwise stated in the HPI except for arthritis, back pain, headaches, excessive urination  Past Medical History:  Diagnosis Date   Abnormal cardiac CT angiography    Acid reflux    Annual physical exam 04/08/2015   Arthritis    Atypical chest pain 06/13/2020   Blood transfusion without reported diagnosis    Body mass index (BMI) 35.0-35.9, adult 04/05/2019   Chronic fatigue 01/28/2015   Chronic headaches    on cymbalta   Chronic migraine w/o aura, not intractable, w/o stat migr 10/24/2018   Cirrhosis (HCC)    Colon polyps    Complication of anesthesia    problems waking up from anesthesia   Coronary artery disease 01/23/2019   Depression    on cymbalta   Diabetes mellitus with neuropathy (HCC)    Diabetes with neuropathy 04/25/2013   Diverticulitis 03/2013   Dyslipidemia 05/29/2019   Eczema    Elevated LFTs    Epidural lipomatosis 10/05/2018   Essential (primary) hypertension 04/05/2019   Essential hypertension 10/10/2019  Fatty liver    GERD (gastroesophageal reflux disease) 04/28/2011   H/O craniotomy 05/07/2015   Headache 04/28/2011   Hepatitis 10/2017   NASH cirrhosis   History of kidney stones    Hyperlipidemia    Hypersomnia with sleep apnea 01/28/2015   Hypertension    IDA (iron deficiency anemia) 01/24/2019   Idiopathic intracranial hypertension 01/14/2017   Insomnia 04/26/2013   Kidney stone    Liver cirrhosis secondary to NASH (nonalcoholic steatohepatitis) (HCC) 01/02/2016   Low back pain 04/05/2019   Lower back injury 08/14/2019   Morbid obesity (HCC)     Neuromuscular disorder (HCC)    neuropathy   Neuropathy    Nonalcoholic steatohepatitis 10/05/2018   Obstructive hydrocephalus (HCC) 01/28/2015   OSA -- dx ~ 2012, cpap intolerant 09/04/2014    dx ~ 2012, cpap intolerant    PCP NOTES >>> 04/08/2015   Post-op pain 03/19/2019   Post-traumatic hydrocephalus (HCC)    s/p shunts x 2 (first got infected )   Presence of cerebrospinal fluid drainage device 07/28/2011   Psoriasis    sees Dr Lenis Noon   Psoriatic arthritis (HCC)    REM behavioral disorder 01/14/2017   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   see's Dr.Ortmann   Severe obesity (BMI >= 40) (HCC) 01/28/2015   SI (sacroiliac) joint dysfunction 08/14/2019   Sigmoid diverticulitis 04/25/2013   Sleep apnea    no CPAP      Spondylolisthesis, lumbar region 03/16/2019   Stomach ulcer    Testosterone deficiency 04/28/2011   VP (ventriculoperitoneal) shunt status 07/31/2020    Past Surgical History:  Procedure Laterality Date   AMPUTATION Left 12/27/2021   Procedure: AMPUTATION GREAT TOE;  Surgeon: Vivi Barrack, DPM;  Location: [facility] OR;  Service: Podiatry;  Laterality: Left;   BACK SURGERY  1980   BRAIN SURGERY     VP shunts placed in 2007   CHOLECYSTECTOMY N/A 08/25/2017   Procedure: LAPAROSCOPIC CHOLECYSTECTOMY WITH INTRAOPERATIVE CHOLANGIOGRAM;  Surgeon: Griselda Miner, MD;  Location: [facility] OR;  Service: General;  Laterality: N/A;   COLONOSCOPY     CORONARY STENT INTERVENTION N/A 09/27/2020   Procedure: CORONARY STENT INTERVENTION;  Surgeon: Corky Crafts, MD;  Location: [facility] INVASIVE CV LAB;  Service: Cardiovascular;  Laterality: N/A;   CORONARY ULTRASOUND/IVUS N/A 09/27/2020   Procedure: Intravascular Ultrasound/IVUS;  Surgeon: Corky Crafts, MD;  Location: [facility] INVASIVE CV LAB;  Service: Cardiovascular;  Laterality: N/A;   JOINT REPLACEMENT     total hip   LEFT HEART CATH N/A 09/27/2020   Procedure: Left Heart Cath;  Surgeon: Corky Crafts, MD;  Location: [facility]  INVASIVE CV LAB;  Service: Cardiovascular;  Laterality: N/A;   LEFT HEART CATH AND CORONARY ANGIOGRAPHY N/A 09/24/2020   Procedure: LEFT HEART CATH AND CORONARY ANGIOGRAPHY;  Surgeon: Corky Crafts, MD;  Location: [facility] INVASIVE CV LAB;  Service: Cardiovascular;  Laterality: N/A;   LEFT HEART CATH AND CORONARY ANGIOGRAPHY N/A 08/18/2021   Procedure: LEFT HEART CATH AND CORONARY ANGIOGRAPHY;  Surgeon: Lennette Bihari, MD;  Location: [facility] INVASIVE CV LAB;  Service: Cardiovascular;  Laterality: N/A;   LUMBAR FUSION  03/16/2019   SHOULDER SURGERY Left 2010   TEE WITHOUT CARDIOVERSION N/A 01/01/2022   Procedure: TRANSESOPHAGEAL ECHOCARDIOGRAM (TEE);  Surgeon: Thomasene Ripple, DO;  Location: [facility] ENDOSCOPY;  Service: Cardiovascular;  Laterality: N/A;   TOE SURGERY Left 2018   TONSILLECTOMY     as a child   TOTAL HIP ARTHROPLASTY Left 2011  UPPER GASTROINTESTINAL ENDOSCOPY  01/04/2020   VENTRICULOPERITONEAL SHUNT  2007   x2    Social History James [name]  reports that he has never smoked. He has never used smokeless tobacco. He reports that he does not drink alcohol and does not use drugs.  family history includes Down syndrome in his son; Heart disease in his brother; Lung cancer in his father; Other in his brother and mother.  Allergies  Allergen Reactions   Bee Venom Anaphylaxis   Hydrocodone Bit-Homatrop Mbr Other (See Comments)    Hallucinations, confusion, delirium Depressed feeling   Toradol [Ketorolac Tromethamine] Other (See Comments)    Hallucinations, confusion, delirium   Prednisone     Patient reports it causes cirrhosis to flare up   Sulfadiazine     NDC ZOXW:[id number] NDC BJYN:[id number] NDC VHQI:[id number]   Morphine And Codeine Other (See Comments)    Hallucinations, back in the 80s. States has taken vicodin before w/o problems    Sulfa Drugs Cross Reactors Rash       PHYSICAL EXAMINATION: Vital signs: BP 124/68   Pulse 80   Ht 5\' 11"  (1.803 m)   Wt 245 lb  9.6 oz (111.4 kg)   SpO2 96%   BMI 34.25 kg/m   Constitutional: generally well-appearing, no acute distress Psychiatric: alert and oriented x3, cooperative Eyes: extraocular movements intact, anicteric, conjunctiva pink Mouth: oral pharynx moist, no lesions Neck: supple no lymphadenopathy Cardiovascular: heart regular rate and rhythm, no murmur Lungs: clear to auscultation bilaterally Abdomen: soft, nontender, nondistended, no obvious ascites, no peritoneal signs, normal bowel sounds, no organomegaly Rectal: Deferred to colonoscopy Extremities: no clubbing, cyanosis, or lower extremity edema bilaterally Skin: no lesions on visible extremities Neuro: No focal deficits.  Cranial nerves intact  ASSESSMENT:   1.  NASH cirrhosis.  Compensated.  MELD score 10 2.  GAVE.  Associated iron deficiency anemia.  Corrected with iron replacement 3.  Upper endoscopy 2021.  No varices.  Due for surveillance 4.  Colonoscopy 2021 with adenomatous polyps.  Due for surveillance 5.  GERD.  Symptoms controlled with PPI 6.  Multiple significant medical problems including coronary artery disease on Plavix and diabetes mellitus with complications 7.  Recent bout of uncomplicated diverticulitis clinically and on CT.  Resolved with antibiotics     PLAN:   1.  Exercise and weight loss 2.  Continue iron replacement 3.  Follow-up surveillance ultrasound of the liver to rule out HCC, in 6 months. 4.  Schedule surveillance upper endoscopy.  The patient is high risk given his comorbidities. 5.  Schedule surveillance colonoscopy.  Patient is high risk as above 6.  Hold Plavix 5 days prior to the procedure.  We will confirm with his cardiologist if this is acceptable. 7.  Adjust diabetic medications for his procedure to avoid unwanted hypoglycemia 8.  Ongoing general medical care with Dr. Drue Novel 9.  Routine GI office follow-up 6 months after the above completed. Total time of 40 minutes was spent preparing to see  the patient, reviewing the myriad of data, obtaining interval comprehensive history, performing medically appropriate physical examination, counseling and educating the patient regarding the above listed issues, ordering multiple endoscopic procedures, adjusting medications for his procedures, directing post procedure follow-up, and documenting clinical information in the health record

## 2022-12-21 NOTE — Patient Instructions (Signed)
You have been scheduled for an endoscopy and colonoscopy. Please follow the written instructions given to you at your visit today. Please pick up your prep supplies at the pharmacy within the next 1-3 days. If you use inhalers (even only as needed), please bring them with you on the day of your procedure.   _______________________________________________________  If your blood pressure at your visit was 140/90 or greater, please contact your primary care physician to follow up on this.  _______________________________________________________  If you are age 25 or older, your body mass index should be between 23-30. Your Body mass index is 34.25 kg/m. If this is out of the aforementioned range listed, please consider follow up with your Primary Care Provider.  If you are age 60 or younger, your body mass index should be between 19-25. Your Body mass index is 34.25 kg/m. If this is out of the aformentioned range listed, please consider follow up with your Primary Care Provider.   ________________________________________________________  The [place] [facility] providers would like to encourage you to use [facility] to communicate with providers for non-urgent requests or questions.  Due to long hold times on the telephone, sending your provider a message by [facility] may be a faster and more efficient way to get a response.  Please allow 48 business hours for a response.  Please remember that this is for non-urgent requests.  _______________________________________________________

## 2022-12-21 NOTE — Telephone Encounter (Signed)
Patient returned Pre-op call. 

## 2022-12-21 NOTE — Telephone Encounter (Signed)
Pt has been scheduled for tele pre op appt 01/01/23 @ 10:20. Med rec and consent are done.      Patient Consent for Virtual Visit        James [name] has provided verbal consent on 12/21/2022 for a virtual visit (video or telephone).   CONSENT FOR VIRTUAL VISIT FOR:  James [name]  By participating in this virtual visit I agree to the following:  I hereby voluntarily request, consent and authorize [facility] and its employed or contracted physicians, physician assistants, nurse practitioners or other licensed health care professionals (the Practitioner), to provide me with telemedicine health care services (the "Services") as deemed necessary by the treating Practitioner. I acknowledge and consent to receive the Services by the Practitioner via telemedicine. I understand that the telemedicine visit will involve communicating with the Practitioner through live audiovisual communication technology and the disclosure of certain medical information by electronic transmission. I acknowledge that I have been given the opportunity to request an in-person assessment or other available alternative prior to the telemedicine visit and am voluntarily participating in the telemedicine visit.  I understand that I have the right to withhold or withdraw my consent to the use of telemedicine in the course of my care at any time, without affecting my right to future care or treatment, and that the Practitioner or I may terminate the telemedicine visit at any time. I understand that I have the right to inspect all information obtained and/or recorded in the course of the telemedicine visit and may receive copies of available information for a reasonable fee.  I understand that some of the potential risks of receiving the Services via telemedicine include:  Delay or interruption in medical evaluation due to technological equipment failure or disruption; Information transmitted may not be sufficient (e.g.  poor resolution of images) to allow for appropriate medical decision making by the Practitioner; and/or  In rare instances, security protocols could fail, causing a breach of personal health information.  Furthermore, I acknowledge that it is my responsibility to provide information about my medical history, conditions and care that is complete and accurate to the best of my ability. I acknowledge that Practitioner's advice, recommendations, and/or decision may be based on factors not within their control, such as incomplete or inaccurate data provided by me or distortions of diagnostic images or specimens that may result from electronic transmissions. I understand that the practice of medicine is not an exact science and that Practitioner makes no warranties or guarantees regarding treatment outcomes. I acknowledge that a copy of this consent can be made available to me via my patient portal [facility] MyChart), or I can request a printed copy by calling the office of  [facility].    I understand that my insurance will be billed for this visit.   I have read or had this consent read to me. I understand the contents of this consent, which adequately explains the benefits and risks of the Services being provided via telemedicine.  I have been provided ample opportunity to ask questions regarding this consent and the Services and have had my questions answered to my satisfaction. I give my informed consent for the services to be provided through the use of telemedicine in my medical care   .

## 2022-12-21 NOTE — Telephone Encounter (Signed)
[facility] Pre-operative Risk Assessment     Request for surgical clearance:     Endoscopy Procedure  What type of surgery is being performed?     Colon/endo  When is this surgery scheduled?     01/19/2023  What type of clearance is required ?   Pharmacy  Are there any medications that need to be held prior to surgery and how long? Plavix - 5 days  Practice name and name of physician performing surgery?      [facility]  What is your office phone and fax number?      Phone- [phone]  Fax- [phone]  Anesthesia type (None, local, MAC, general) ?       MAC

## 2022-12-21 NOTE — Telephone Encounter (Signed)
Pt has been scheduled for tele pre op appt 01/01/23 @ 10:20. Med rec and consent are done.

## 2022-12-22 ENCOUNTER — Other Ambulatory Visit (HOSPITAL_BASED_OUTPATIENT_CLINIC_OR_DEPARTMENT_OTHER): Payer: Self-pay

## 2022-12-25 ENCOUNTER — Other Ambulatory Visit (HOSPITAL_BASED_OUTPATIENT_CLINIC_OR_DEPARTMENT_OTHER): Payer: Self-pay

## 2022-12-25 ENCOUNTER — Other Ambulatory Visit: Payer: Self-pay | Admitting: Endocrinology

## 2022-12-25 DIAGNOSIS — E1165 Type 2 diabetes mellitus with hyperglycemia: Secondary | ICD-10-CM

## 2022-12-29 ENCOUNTER — Other Ambulatory Visit: Payer: Self-pay | Admitting: Cardiology

## 2022-12-29 ENCOUNTER — Other Ambulatory Visit (HOSPITAL_BASED_OUTPATIENT_CLINIC_OR_DEPARTMENT_OTHER): Payer: Self-pay

## 2022-12-29 ENCOUNTER — Other Ambulatory Visit: Payer: Self-pay

## 2022-12-29 DIAGNOSIS — E1165 Type 2 diabetes mellitus with hyperglycemia: Secondary | ICD-10-CM

## 2022-12-29 MED ORDER — CLOPIDOGREL BISULFATE 75 MG PO TABS
75.0000 mg | ORAL_TABLET | Freq: Every day | ORAL | 3 refills | Status: DC
  Filled 2022-12-29: qty 90, 90d supply, fill #0
  Filled 2023-04-23: qty 90, 90d supply, fill #1

## 2022-12-29 MED ORDER — AMLODIPINE BESYLATE 5 MG PO TABS
5.0000 mg | ORAL_TABLET | Freq: Every day | ORAL | 3 refills | Status: DC
  Filled 2022-12-29: qty 90, 90d supply, fill #0
  Filled 2023-03-24: qty 90, 90d supply, fill #1
  Filled 2023-06-22: qty 90, 90d supply, fill #2
  Filled 2023-09-20: qty 90, 90d supply, fill #3

## 2022-12-30 ENCOUNTER — Other Ambulatory Visit: Payer: Self-pay | Admitting: Endocrinology

## 2022-12-30 ENCOUNTER — Other Ambulatory Visit (HOSPITAL_COMMUNITY): Payer: Self-pay

## 2022-12-30 ENCOUNTER — Encounter: Payer: Self-pay | Admitting: Endocrinology

## 2022-12-30 ENCOUNTER — Other Ambulatory Visit (HOSPITAL_BASED_OUTPATIENT_CLINIC_OR_DEPARTMENT_OTHER): Payer: Self-pay

## 2022-12-30 ENCOUNTER — Other Ambulatory Visit: Payer: Self-pay

## 2022-12-30 DIAGNOSIS — E1165 Type 2 diabetes mellitus with hyperglycemia: Secondary | ICD-10-CM

## 2022-12-30 MED ORDER — METFORMIN HCL 500 MG PO TABS
1000.0000 mg | ORAL_TABLET | Freq: Two times a day (BID) | ORAL | 0 refills | Status: DC
Start: 2022-12-30 — End: 2022-12-30
  Filled 2022-12-30: qty 120, 30d supply, fill #0

## 2022-12-30 MED ORDER — METFORMIN HCL 500 MG PO TABS
1000.0000 mg | ORAL_TABLET | Freq: Two times a day (BID) | ORAL | 0 refills | Status: DC
Start: 2022-12-30 — End: 2023-02-05
  Filled 2022-12-30: qty 120, 30d supply, fill #0

## 2023-01-01 ENCOUNTER — Other Ambulatory Visit (HOSPITAL_COMMUNITY): Payer: Self-pay

## 2023-01-01 ENCOUNTER — Ambulatory Visit: Payer: 59 | Attending: Cardiovascular Disease | Admitting: Student

## 2023-01-01 DIAGNOSIS — Z01810 Encounter for preprocedural cardiovascular examination: Secondary | ICD-10-CM

## 2023-01-01 NOTE — Progress Notes (Signed)
Virtual Visit via Telephone Note   Because of James [name]'s co-morbid illnesses, he is at least at moderate risk for complications without adequate follow up.  This format is felt to be most appropriate for this patient at this time.  The patient did not have access to video technology/had technical difficulties with video requiring transitioning to audio format only (telephone).  All issues noted in this document were discussed and addressed.  No physical exam could be performed with this format.  Please refer to the patient's chart for his consent to telehealth for [facility].  Evaluation Performed:  Preoperative cardiovascular risk assessment _____________   Date:  01/01/2023   Patient ID:  James [name], DOB April 16, 1956, MRN [id number] Patient Location:  Home Provider location:   Office  Primary Care Provider:  Wanda Plump, MD Primary Cardiologist:  Gypsy Balsam, MD  Chief Complaint / Patient Profile   67 y.o. y/o male with a h/o CAD s/p PCI with DES to LAD 2022, hypertension, hyperlipidemia, OSA, GERD who is pending colonoscopy/endoscopy by [facility] and presents today for telephonic preoperative cardiovascular risk assessment.  History of Present Illness    James [name] is a 67 y.o. male who presents via audio/video conferencing for a telehealth visit today.  Pt was last seen in cardiology clinic on 10/16/2022 by Dr. Bing Matter.  At that time James [name] was doing well.  The patient is now pending procedure as outlined above. Since his last visit, he is doing well from a cardiac perspective. Patient denies shortness of breath or dyspnea on exertion. No chest pain, pressure, or tightness. Denies lower extremity edema, orthopnea, or PND. No palpitations.  He stays active performing light to moderate yard work daily.   Past Medical History    Past Medical History:  Diagnosis Date   Abnormal cardiac CT angiography    Acid reflux    Annual physical exam  04/08/2015   Arthritis    Atypical chest pain 06/13/2020   Blood transfusion without reported diagnosis    Body mass index (BMI) 35.0-35.9, adult 04/05/2019   Chronic fatigue 01/28/2015   Chronic headaches    on cymbalta   Chronic migraine w/o aura, not intractable, w/o stat migr 10/24/2018   Cirrhosis (HCC)    Colon polyps    Complication of anesthesia    problems waking up from anesthesia   Coronary artery disease 01/23/2019   Depression    on cymbalta   Diabetes mellitus with neuropathy (HCC)    Diabetes with neuropathy 04/25/2013   Diverticulitis 03/2013   Dyslipidemia 05/29/2019   Eczema    Elevated LFTs    Epidural lipomatosis 10/05/2018   Essential (primary) hypertension 04/05/2019   Essential hypertension 10/10/2019   Fatty liver    GERD (gastroesophageal reflux disease) 04/28/2011   H/O craniotomy 05/07/2015   Headache 04/28/2011   Hepatitis 10/2017   NASH cirrhosis   History of kidney stones    Hyperlipidemia    Hypersomnia with sleep apnea 01/28/2015   Hypertension    IDA (iron deficiency anemia) 01/24/2019   Idiopathic intracranial hypertension 01/14/2017   Insomnia 04/26/2013   Kidney stone    Liver cirrhosis secondary to NASH (nonalcoholic steatohepatitis) (HCC) 01/02/2016   Low back pain 04/05/2019   Lower back injury 08/14/2019   Morbid obesity (HCC)    Neuromuscular disorder (HCC)    neuropathy   Neuropathy    Nonalcoholic steatohepatitis 10/05/2018   Obstructive hydrocephalus (HCC) 01/28/2015   OSA --  dx ~ 2012, cpap intolerant 09/04/2014    dx ~ 2012, cpap intolerant    PCP NOTES >>> 04/08/2015   Post-op pain 03/19/2019   Post-traumatic hydrocephalus (HCC)    s/p shunts x 2 (first got infected )   Presence of cerebrospinal fluid drainage device 07/28/2011   Psoriasis    sees Dr Lenis Noon   Psoriatic arthritis (HCC)    REM behavioral disorder 01/14/2017   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   see's Dr.Ortmann   Severe obesity (BMI >= 40) (HCC)  01/28/2015   SI (sacroiliac) joint dysfunction 08/14/2019   Sigmoid diverticulitis 04/25/2013   Sleep apnea    no CPAP      Spondylolisthesis, lumbar region 03/16/2019   Stomach ulcer    Testosterone deficiency 04/28/2011   VP (ventriculoperitoneal) shunt status 07/31/2020   Past Surgical History:  Procedure Laterality Date   AMPUTATION Left 12/27/2021   Procedure: AMPUTATION GREAT TOE;  Surgeon: Vivi Barrack, DPM;  Location: [facility] OR;  Service: Podiatry;  Laterality: Left;   BACK SURGERY  1980   BRAIN SURGERY     VP shunts placed in 2007   CHOLECYSTECTOMY N/A 08/25/2017   Procedure: LAPAROSCOPIC CHOLECYSTECTOMY WITH INTRAOPERATIVE CHOLANGIOGRAM;  Surgeon: Griselda Miner, MD;  Location[facility];  Service: General;  Laterality: N/A;   COLONOSCOPY     CORONARY STENT INTERVENTION N/A 09/27/2020   Procedure: CORONARY STENT INTERVENTION;  Surgeon: Corky Crafts, MD;  Location: [facility] INVASIVE CV LAB;  Service: Cardiovascular;  Laterality: N/A;   CORONARY ULTRASOUND/IVUS N/A 09/27/2020   Procedure: Intravascular Ultrasound/IVUS;  Surgeon: Corky Crafts, MD;  Location: [facility] INVASIVE CV LAB;  Service: Cardiovascular;  Laterality: N/A;   JOINT REPLACEMENT     total hip   LEFT HEART CATH N/A 09/27/2020   Procedure: Left Heart Cath;  Surgeon: Corky Crafts, MD;  Location: [facility] INVASIVE CV LAB;  Service: Cardiovascular;  Laterality: N/A;   LEFT HEART CATH AND CORONARY ANGIOGRAPHY N/A 09/24/2020   Procedure: LEFT HEART CATH AND CORONARY ANGIOGRAPHY;  Surgeon: Corky Crafts, MD;  Location: [facility] INVASIVE CV LAB;  Service: Cardiovascular;  Laterality: N/A;   LEFT HEART CATH AND CORONARY ANGIOGRAPHY N/A 08/18/2021   Procedure: LEFT HEART CATH AND CORONARY ANGIOGRAPHY;  Surgeon: Lennette Bihari, MD;  Location: [facility] INVASIVE CV LAB;  Service: Cardiovascular;  Laterality: N/A;   LUMBAR FUSION  03/16/2019   SHOULDER SURGERY Left 2010   TEE WITHOUT CARDIOVERSION N/A 01/01/2022   Procedure: TRANSESOPHAGEAL  ECHOCARDIOGRAM (TEE);  Surgeon: Thomasene Ripple, DO;  Location: [facility] ENDOSCOPY;  Service: Cardiovascular;  Laterality: N/A;   TOE SURGERY Left 2018   TONSILLECTOMY     as a child   TOTAL HIP ARTHROPLASTY Left 2011   UPPER GASTROINTESTINAL ENDOSCOPY  01/04/2020   VENTRICULOPERITONEAL SHUNT  2007   x2    Allergies  Allergies  Allergen Reactions   Bee Venom Anaphylaxis   Hydrocodone Bit-Homatrop Mbr Other (See Comments)    Hallucinations, confusion, delirium Depressed feeling   Toradol [Ketorolac Tromethamine] Other (See Comments)    Hallucinations, confusion, delirium   Prednisone     Patient reports it causes cirrhosis to flare up   Sulfadiazine     NDC ZOXW:[id number] NDC BJYN:[id number] NDC VHQI:[id number]   Morphine And Codeine Other (See Comments)    Hallucinations, back in the 80s. States has taken vicodin before w/o problems    Sulfa Drugs Cross Reactors Rash    Home Medications    Prior to Admission  medications   Medication Sig Start Date End Date Taking? Authorizing Provider  acetaminophen (TYLENOL) 325 MG tablet Take 2 tablets (650 mg total) by mouth every 4 (four) hours as needed for headache or mild pain. 08/18/21   Leone Brand, NP  Adalimumab (HUMIRA, 2 PEN,) 40 MG/0.8ML PNKT Inject 40 mg (0.8 ml) under the skin every other week Patient taking differently: Inject 40 mg into the skin every 14 (fourteen) days. 07/06/22   Quentin Angst, MD  amLODipine (NORVASC) 5 MG tablet Take 1 tablet (5 mg total) by mouth daily. 12/29/22   Georgeanna Lea, MD  aspirin 81 MG chewable tablet Chew 1 tablet (81 mg total) by mouth daily. 08/19/21   Leone Brand, NP  atorvastatin (LIPITOR) 80 MG tablet Take 1 tablet (80 mg total) by mouth at bedtime. 09/30/22   Wanda Plump, MD  botulinum toxin Type A (BOTOX) 200 units injection Provider to inject 155 units into the muscles of the head and neck every 3 months. Discard remainder. 10/12/22   Sater, Pearletha Furl, MD  carvedilol (COREG)  12.5 MG tablet Take 1 tablet (12.5 mg total) by mouth 2 (two) times daily with a meal. 10/30/22   Paz, Nolon Rod, MD  clobetasol (TEMOVATE) 0.05 % external solution Apply 1 application externally twice a day 14 days. Patient taking differently: Apply 1 Application topically 2 (two) times daily. 10/01/22     clonazePAM (KLONOPIN) 0.5 MG tablet Take 1 tablet (0.5 mg total) by mouth at bedtime. 10/12/22 04/10/23  Sater, Pearletha Furl, MD  clopidogrel (PLAVIX) 75 MG tablet Take 1 tablet (75 mg total) by mouth daily. 12/29/22 12/29/23  Georgeanna Lea, MD  Continuous Glucose Sensor (DEXCOM G6 SENSOR) MISC Use to monitor blood sugar, change after 10 days Patient taking differently: 1 each by Other route See admin instructions. Use to monitor blood sugar, change after 10 days 10/07/22   Reather Littler, MD  DULoxetine (CYMBALTA) 60 MG capsule Take 2 capsules (120 mg total) by mouth daily. 09/30/22   Wanda Plump, MD  empagliflozin (JARDIANCE) 10 MG TABS tablet Take 1 tablet (10 mg total) by mouth daily with breakfast. 12/02/22   Reather Littler, MD  EPINEPHrine (EPIPEN 2-PAK) 0.3 mg/0.3 mL IJ SOAJ injection Inject 0.3 mLs (0.3 mg total) into the muscle once as needed for up to 1 dose for anaphylaxis. 01/10/20   Wanda Plump, MD  fenofibrate micronized (LOFIBRA) 134 MG capsule Take 1 capsule (134 mg total) by mouth 3 (three) times a week. 12/02/22 12/02/23  Reather Littler, MD  gabapentin (NEURONTIN) 600 MG tablet Take 1 tablet (600 mg total) by mouth 2 (two) times daily. 09/30/22   Wanda Plump, MD  glimepiride (AMARYL) 4 MG tablet Take 1 tablet (4 mg total) by mouth daily before breakfast. 10/30/22   Reather Littler, MD  Insulin Disposable Pump (OMNIPOD 5 G6 PODS, GEN 5,) MISC Use as directed every 3 (three) days. 09/09/22   Reather Littler, MD  insulin lispro (HUMALOG) 100 UNIT/ML injection Use up to 60 Units daily in pump Patient taking differently: Inject 60 Units into the skin See admin instructions. Use up to 60 Units daily in pump 10/07/22   Reather Littler, MD  Insulin Syringe-Needle U-100 (INSULIN SYRINGE 1CC/31GX5/16") 31G X 5/16" 1 ML MISC Use to administer Humalog 3 times a day Patient taking differently: 1 tablet by Other route See admin instructions. Use to administer Humalog 3 times a day 10/07/22   Reather Littler, MD  isosorbide mononitrate (IMDUR) 120 MG 24 hr tablet Take 1 tablet (120 mg total) by mouth daily. 07/14/22   Georgeanna Lea, MD  liraglutide (VICTOZA) 18 MG/3ML SOPN Start with 0.6mg  subcutaneously once a day for 7 days, then increase to 1.2mg  once a day Patient taking differently: Inject 0.6 mg into the skin every 7 (seven) days. Start with 0.6mg  subcutaneously once a day for 7 days, then increase to 1.2mg  once a day 06/18/22   Reather Littler, MD  metFORMIN (GLUCOPHAGE) 500 MG tablet Take 2 tablets (1,000 mg total) by mouth 2 (two) times daily. 12/30/22   Reather Littler, MD  nitroGLYCERIN (NITROSTAT) 0.4 MG SL tablet Place 1 tablet (0.4 mg total) under the tongue every 5 (five) minutes for 3 doses as needed for chest pain. 06/26/21   Wanda Plump, MD  ondansetron (ZOFRAN) 4 MG tablet Take 1 tablet (4 mg total) by mouth every 6 (six) hours as needed for nausea or vomiting. 11/12/22   Glyn Ade, MD  ondansetron (ZOFRAN-ODT) 4 MG disintegrating tablet Take 1 tablet (4 mg total) by mouth every 8 (eight) hours as needed. 10/18/22   Mardene Sayer, MD  pantoprazole (PROTONIX) 40 MG tablet Take 1 tablet (40 mg total) by mouth daily. 09/21/22   Wanda Plump, MD  traMADol (ULTRAM) 50 MG tablet Take 1 tablet (50 mg total) by mouth every 6 (six) hours as needed for up to 10 doses for severe pain. 10/18/22   Mardene Sayer, MD    Physical Exam    Vital Signs:  James [name] does not have vital signs available for review today.  Given telephonic nature of communication, physical exam is limited. AAOx3. NAD. Normal affect.  Speech and respirations are unlabored.  Accessory Clinical Findings    None  Assessment & Plan     Primary Cardiologist: Gypsy Balsam, MD  Preoperative cardiovascular risk assessment. Colonoscopy/endoscopy by [facility].   Chart reviewed as part of pre-operative protocol coverage. According to the RCRI, patient has a 0.4% risk of MACE. Patient reports activity equivalent to >4.0 METS (performing light to moderate yard work daily).   Given past medical history and time since last visit, based on ACC/AHA guidelines, James [name] would be at acceptable risk for the planned procedure without further cardiovascular testing.   Patient was advised that if he develops new symptoms prior to surgery to contact our office to arrange a follow-up appointment.  he verbalized understanding.  Per office protocol, he may hold Plavix for 5 days prior to procedure and should resume as soon as hemodynamically stable postoperatively. Patient to continue aspirin throughout perioperative period.   I will route this recommendation to the requesting party via Epic fax function.  Please call with questions.  Time:   Today, I have spent 5 minutes with the patient with telehealth technology discussing medical history, symptoms, and management plan.     James Levering, NP  01/01/2023, 8:30 AM

## 2023-01-01 NOTE — Telephone Encounter (Signed)
LM for patient, that according to his telephone visit today with Cardiology, he is cleared to hold Plavix for 5 days prior to procedure with Dr. Marina Goodell.  He should hold Plavix starting on 7-18. Dr. Marina Goodell will advise when to resume after the procedure on the 23rd. Asked that patient call back to confirm and let us know if he has any questions

## 2023-01-04 ENCOUNTER — Ambulatory Visit (INDEPENDENT_AMBULATORY_CARE_PROVIDER_SITE_OTHER): Payer: 59 | Admitting: Neurology

## 2023-01-04 ENCOUNTER — Other Ambulatory Visit (HOSPITAL_COMMUNITY): Payer: Self-pay

## 2023-01-04 ENCOUNTER — Encounter: Payer: Self-pay | Admitting: Neurology

## 2023-01-04 DIAGNOSIS — G43709 Chronic migraine without aura, not intractable, without status migrainosus: Secondary | ICD-10-CM | POA: Diagnosis not present

## 2023-01-04 MED ORDER — ONABOTULINUMTOXINA 200 UNITS IJ SOLR
155.0000 [IU] | Freq: Once | INTRAMUSCULAR | Status: DC
Start: 2023-01-04 — End: 2023-09-28

## 2023-01-04 NOTE — Progress Notes (Signed)
Botox- 200 units x 1 vial Lot: [id number] Expiration: 11/2024 NDC: 4540-9811-91  Bacteriostatic 0.9% Sodium Chloride- * mL  Lot: [id number] Expiration:09/28/2023 NDC:0409-1966-02  Dx:G43.709 S/P Witnessed by:Dr.Sater drew up sodium chloride and Botox

## 2023-01-04 NOTE — Progress Notes (Signed)
[facility]  PATIENT: James [name] DOB: [birth year]  REFERRING DOCTOR OR PCP:  Willow Ora  SOURCE: patient, notes from Dr. Drue Novel, imaging results and MRI and CT scans on PACS personally reviewed  _________________________________   HISTORICAL  CHIEF COMPLAINT:  Chief Complaint  Patient presents with   Room 10    Pt is here Alone. Pt states that he hasn't had any blood thinners today.     HISTORY OF PRESENT ILLNESS:  James [name] is a 67 y.o. man with idiopathic intracranial hypertension and recent increase in the frequency and severity of headache  Update 7/8//2024:  He reports that have done fairly well with the Botox therapy.  As with other cycles, he has had more headaches the last couple weeks of the cycle compared to the first 2 and half months.  Before Botox he was having daily headaches that were more severe.  Neck pain is also doing better.    When a a migraine occurs it is bilateral most of the time and occipital and frontal located.   When the headaches more severe he has photophobia and phonophobia. Sometimes, headaches improve when he is laying down but other times they do not change between sitting and lying down. Moving usually worsens the headaches.  He did not get a benefit from multiple prophylactic medications including Keppra, gabapentin, Emgality, Cymbalta.    Besides the chronic migraine, he has obstructive sleep apnea but was unable to tolerate CPAP.  We had discussed the inspire device but he is not interested at this time..  We also discussed that he should try to lose some more weight.     His REM behavior disorder has done well on nighttime clonazepam. He has had no active dreams since starting it .   No other signs of a synucleinopathy.  He has a VP shunt for idiopathic intracranial hypertension, since 2017.  Other medical issues:   In early 2022, he had 3 cardiac stents after being diagnosed with CAD after presenting with chest  pain.   He had a small MI.    He is also on Humira for psoriatis.  He has Elita Boone related cirrhosis.   Chronic Migraine History: He was having daily HA with migrainous features.   He did not get a benefit from multiple prophylactic medications including Keppra, gabapentin, Emgality, Cymbalta.   Botox therapy started July 2021.  Spine: L-spine MRI 08/15/18 showed progressive epidural lipomatosis in the lower lumbar spine with progressive compression of the thecal sac and spinal stenosis at L3-4, L4-5, and L5-S1 compared to the prior study. Progressive facet degeneration and anterolisthesis at L4-5 since 2011.    MRI of the cervical spine performed 04/27/2017 showed  thoracic fusion hardware from C7 and below. There is mild spondylosis and disc bulging at C3-C4 through C6-C7. There does not appear to be any significant foraminal narrowing and there is no spinal stenosis.   Idiopthic intracranial Hypertension: He was diagnosed with idiopathic intracranial hypertension many years ago and had a VP shunt placed in 2007. A revision was performed July 2007 due to infection. He has a programmable Medtronic valve. Due to the increased headaches, it was reprogrammed from 1.5-1.0. Tapping of the shunt showed a pressure 160 (was drained to 140 mm). There was no infection. He also had an MRI of the brain and MR venogram. CT had shown the left-sided ventricular catheter extends into the right caudate head, similar to the previous study the MR venogram (11/12/2016) was reportedly normal.  The left transverse and sigmoid sinuses are not well evaluated due to the shunt reservoir (but also likely he is right dominant).   CT head 3/13 2020 personally reviewed and no evidence of shunt failure        REVIEW OF SYSTEMS: Constitutional: No fevers, chills, sweats, or change in appetite Eyes: No visual changes, double vision, eye pain Ear, nose and throat: No hearing loss, ear pain, nasal congestion, sore  throat Cardiovascular: No chest pain, palpitations Respiratory:  No shortness of breath at rest or with exertion.   No wheezes GastrointestinaI: No nausea, vomiting, diarrhea, abdominal pain, fecal incontinence Genitourinary:  No dysuria, urinary retention or frequency.  No nocturia. Musculoskeletal:  as above Integumentary: psoriasis on Humira Neurological: as above Psychiatric: No depression at this time.  No anxiety Endocrine: No palpitations, diaphoresis, change in appetite, change in weigh or increased thirst Hematologic/Lymphatic:  No anemia, purpura, petechiae. Allergic/Immunologic: No itchy/runny eyes, nasal congestion, recent allergic reactions, rashes  ALLERGIES: Allergies  Allergen Reactions   Bee Venom Anaphylaxis   Hydrocodone Bit-Homatrop Mbr Other (See Comments)    Hallucinations, confusion, delirium Depressed feeling   Toradol [Ketorolac Tromethamine] Other (See Comments)    Hallucinations, confusion, delirium   Prednisone     Patient reports it causes cirrhosis to flare up   Sulfadiazine     NDC ZOXW:[id number] NDC BJYN:[id number] NDC VHQI:[id number]   Morphine And Related Other (See Comments)    Hallucinations, back in the 80s. States has taken vicodin before w/o problems    Sulfa Drugs Cross Reactors Rash    HOME MEDICATIONS:  Current Outpatient Medications:    acetaminophen (TYLENOL) 325 MG tablet, Take 2 tablets (650 mg total) by mouth every 4 (four) hours as needed for headache or mild pain., Disp: , Rfl:    Adalimumab (HUMIRA, 2 PEN,) 40 MG/0.8ML PNKT, Inject 40 mg (0.8 ml) under the skin every other week (Patient taking differently: Inject 40 mg into the skin every 14 (fourteen) days.), Disp: 2 each, Rfl: 4   amLODipine (NORVASC) 5 MG tablet, Take 1 tablet (5 mg total) by mouth daily., Disp: 90 tablet, Rfl: 3   aspirin 81 MG chewable tablet, Chew 1 tablet (81 mg total) by mouth daily., Disp: , Rfl:    atorvastatin (LIPITOR) 80 MG tablet, Take 1  tablet (80 mg total) by mouth at bedtime., Disp: 90 tablet, Rfl: 1   carvedilol (COREG) 12.5 MG tablet, Take 1 tablet (12.5 mg total) by mouth 2 (two) times daily with a meal., Disp: 180 tablet, Rfl: 0   clobetasol (TEMOVATE) 0.05 % external solution, Apply 1 application externally twice a day 14 days., Disp: 50 mL, Rfl: 1   clopidogrel (PLAVIX) 75 MG tablet, TAKE 1 TABLET (75 MG TOTAL) BY MOUTH DAILY., Disp: 90 tablet, Rfl: 3   Continuous Blood Gluc Sensor (DEXCOM G6 SENSOR) MISC, Use to monitor blood sugar, change after 10 days, Disp: 3 each, Rfl: 3   DULoxetine (CYMBALTA) 60 MG capsule, Take 2 capsules (120 mg total) by mouth daily., Disp: 180 capsule, Rfl: 1   EPINEPHrine (EPIPEN 2-PAK) 0.3 mg/0.3 mL IJ SOAJ injection, Inject 0.3 mLs (0.3 mg total) into the muscle once as needed for up to 1 dose for anaphylaxis., Disp: 2 each, Rfl: 2   fenofibrate micronized (LOFIBRA) 134 MG capsule, Take 1 capsule (134 mg total) by mouth 3 (three) times a week., Disp: 15 capsule, Rfl: 3   gabapentin (NEURONTIN) 600 MG tablet, Take 1 tablet (600 mg total) by mouth 2 (  two) times daily., Disp: 180 tablet, Rfl: 1   glimepiride (AMARYL) 4 MG tablet, Take 1 tablet (4 mg total) by mouth daily before breakfast., Disp: 30 tablet, Rfl: 3   Insulin Disposable Pump (OMNIPOD 5 G6 PODS, GEN 5,) MISC, 1 Device by Does not apply route every 3 (three) days., Disp: 10 each, Rfl: 3   insulin lispro (HUMALOG) 100 UNIT/ML injection, Use up to 60 Units daily in pump, Disp: 20 mL, Rfl: 2   Insulin Syringe-Needle U-100 (INSULIN SYRINGE 1CC/31GX5/16") 31G X 5/16" 1 ML MISC, Use to administer Humalog 3 times a day, Disp: 100 each, Rfl: 0   isosorbide mononitrate (IMDUR) 120 MG 24 hr tablet, Take 1 tablet (120 mg total) by mouth daily., Disp: 90 tablet, Rfl: 3   liraglutide (VICTOZA) 18 MG/3ML SOPN, Start with 0.6mg  subcutaneously once a day for 7 days, then increase to 1.2mg  once a day, Disp: 6 mL, Rfl: 2   metFORMIN (GLUCOPHAGE) 500 MG  tablet, Take 2 tablets (1,000 mg total) by mouth 2 (two) times daily., Disp: 120 tablet, Rfl: 0   nitroGLYCERIN (NITROSTAT) 0.4 MG SL tablet, Place 1 tablet (0.4 mg total) under the tongue every 5 (five) minutes for 3 doses as needed for chest pain., Disp: 25 tablet, Rfl: 6   pantoprazole (PROTONIX) 40 MG tablet, Take 1 tablet (40 mg total) by mouth daily., Disp: 90 tablet, Rfl: 1   botulinum toxin Type A (BOTOX) 200 units injection, Provider to inject 155 units into the muscles of the head and neck every 3 months. Discard remainder., Disp: 1 each, Rfl: 2   clonazePAM (KLONOPIN) 0.5 MG tablet, Take 1 tablet (0.5 mg total) by mouth at bedtime., Disp: 30 tablet, Rfl: 5  PAST MEDICAL HISTORY: Past Medical History:  Diagnosis Date   Abnormal cardiac CT angiography    Acid reflux    Annual physical exam 04/08/2015   Arthritis    Atypical chest pain 06/13/2020   Blood transfusion without reported diagnosis    Body mass index (BMI) 35.0-35.9, adult 04/05/2019   Chronic fatigue 01/28/2015   Chronic headaches    on cymbalta   Chronic migraine w/o aura, not intractable, w/o stat migr 10/24/2018   Cirrhosis    Colon polyps    Complication of anesthesia    problems waking up from anesthesia   Coronary artery disease 01/23/2019   Depression    on cymbalta   Diabetes mellitus with neuropathy    Diabetes with neuropathy 04/25/2013   Diverticulitis 03/2013   Dyslipidemia 05/29/2019   Eczema    Elevated LFTs    Epidural lipomatosis 10/05/2018   Essential (primary) hypertension 04/05/2019   Essential hypertension 10/10/2019   Fatty liver    GERD (gastroesophageal reflux disease) 04/28/2011   H/O craniotomy 05/07/2015   Headache 04/28/2011   Hepatitis 10/2017   NASH cirrhosis   History of kidney stones    Hyperlipidemia    Hypersomnia with sleep apnea 01/28/2015   Hypertension    IDA (iron deficiency anemia) 01/24/2019   Idiopathic intracranial hypertension 01/14/2017   Insomnia 04/26/2013   Kidney  stone    Liver cirrhosis secondary to NASH (nonalcoholic steatohepatitis) 01/02/2016   Low back pain 04/05/2019   Lower back injury 08/14/2019   Morbid obesity    Neuromuscular disorder    neuropathy   Neuropathy    Nonalcoholic steatohepatitis 10/05/2018   Obstructive hydrocephalus 01/28/2015   OSA -- dx ~ 2012, cpap intolerant 09/04/2014    dx ~ 2012, cpap intolerant  PCP NOTES >>> 04/08/2015   Post-op pain 03/19/2019   Post-traumatic hydrocephalus    s/p shunts x 2 (first got infected )   Presence of cerebrospinal fluid drainage device 07/28/2011   Psoriasis    sees Dr Lenis Noon   Psoriatic arthritis    REM behavioral disorder 01/14/2017   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   see's Dr.Ortmann   Severe obesity (BMI >= 40) 01/28/2015   SI (sacroiliac) joint dysfunction 08/14/2019   Sigmoid diverticulitis 04/25/2013   Sleep apnea    no CPAP      Spondylolisthesis, lumbar region 03/16/2019   Stomach ulcer    Testosterone deficiency 04/28/2011   VP (ventriculoperitoneal) shunt status 07/31/2020    PAST SURGICAL HISTORY: Past Surgical History:  Procedure Laterality Date   AMPUTATION Left 12/27/2021   Procedure: AMPUTATION GREAT TOE;  Surgeon: Vivi Barrack, DPM;  Location: [facility] OR;  Service: Podiatry;  Laterality: Left;   BACK SURGERY  1980   BRAIN SURGERY     VP shunts placed in 2007   CHOLECYSTECTOMY N/A 08/25/2017   Procedure: LAPAROSCOPIC CHOLECYSTECTOMY WITH INTRAOPERATIVE CHOLANGIOGRAM;  Surgeon: Griselda Miner, MD;  Location: [facility] OR;  Service: General;  Laterality: N/A;   COLONOSCOPY     CORONARY STENT INTERVENTION N/A 09/27/2020   Procedure: CORONARY STENT INTERVENTION;  Surgeon: Corky Crafts, MD;  Location: [facility] INVASIVE CV LAB;  Service: Cardiovascular;  Laterality: N/A;   CORONARY ULTRASOUND/IVUS N/A 09/27/2020   Procedure: Intravascular Ultrasound/IVUS;  Surgeon: Corky Crafts, MD;  Location: [facility] INVASIVE CV LAB;  Service: Cardiovascular;  Laterality:  N/A;   JOINT REPLACEMENT     total hip   LEFT HEART CATH N/A 09/27/2020   Procedure: Left Heart Cath;  Surgeon: Corky Crafts, MD;  [facility] INVASIVE CV LAB;  Service: Cardiovascular;  Laterality: N/A;   LEFT HEART CATH AND CORONARY ANGIOGRAPHY N/A 09/24/2020   Procedure: LEFT HEART CATH AND CORONARY ANGIOGRAPHY;  Surgeon: Corky Crafts, MD;  Location: [facility] INVASIVE CV LAB;  Service: Cardiovascular;  Laterality: N/A;   LEFT HEART CATH AND CORONARY ANGIOGRAPHY N/A 08/18/2021   Procedure: LEFT HEART CATH AND CORONARY ANGIOGRAPHY;  Surgeon: Lennette Bihari, MD;  Location: [facility] INVASIVE CV LAB;  Service: Cardiovascular;  Laterality: N/A;   LUMBAR FUSION  03/16/2019   SHOULDER SURGERY Left 2010   TEE WITHOUT CARDIOVERSION N/A 01/01/2022   Procedure: TRANSESOPHAGEAL ECHOCARDIOGRAM (TEE);  Surgeon: Thomasene Ripple, DO;  Location: [facility] ENDOSCOPY;  Service: Cardiovascular;  Laterality: N/A;   TOE SURGERY Left 2018   TONSILLECTOMY     as a child   TOTAL HIP ARTHROPLASTY Left 2011   UPPER GASTROINTESTINAL ENDOSCOPY  01/04/2020   VENTRICULOPERITONEAL SHUNT  2007   x2    FAMILY HISTORY: Family History  Problem Relation Age of Onset   Other Mother    Lung cancer Father        alive, former smoker    Heart disease Brother        MI age 63   Other Brother        Murdered   Down syndrome Son    Diabetes Neg Hx    Prostate cancer Neg Hx    Colon cancer Neg Hx    Stomach cancer Neg Hx    Pancreatic cancer Neg Hx    Liver disease Neg Hx     SOCIAL HISTORY:  Social History   Socioeconomic History   Marital status: Married    Spouse name: Burna Mortimer  Number of children: 2   Years of education: Not on file   Highest education level: Not on file  Occupational History   Occupation: disabled   Tobacco Use   Smoking status: Never   Smokeless tobacco: Never  Vaping Use   Vaping Use: Never used  Substance and Sexual Activity   Alcohol use: No   Drug use: No   Sexual activity: Yes     Partners: Female  Other Topics Concern   Not on file  Social History Narrative   Household-- pt , wife, one adult son with Down's syndrome   younger son lives in [place]   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   Social Determinants of Health   Financial Resource Strain: Low Risk  (03/18/2021)   Overall Financial Resource Strain (CARDIA)    Difficulty of Paying Living Expenses: Not hard at all  Food Insecurity: No Food Insecurity (01/22/2022)   Hunger Vital Sign    Worried About Running Out of Food in the Last Year: Never true    Ran Out of Food in the Last Year: Never true  Transportation Needs: No Transportation Needs (01/22/2022)   PRAPARE - Administrator, Civil Service (Medical): No    Lack of Transportation (Non-Medical): No  Physical Activity: Inactive (03/18/2021)   Exercise Vital Sign    Days of Exercise per Week: 0 days    Minutes of Exercise per Session: 0 min  Stress: No Stress Concern Present (03/18/2021)   [employer] of Occupational Health - Occupational Stress Questionnaire    Feeling of Stress : Not at all  Social Connections: Moderately Integrated (03/18/2021)   Social Connection and Isolation Panel [NHANES]    Frequency of Communication with Friends and Family: More than three times a week    Frequency of Social Gatherings with Friends and Family: Once a week    Attends Religious Services: Never    Database administrator or Organizations: Yes    Attends Banker Meetings: 1 to 4 times per year    Marital Status: Married  Catering manager Violence: Not At Risk (01/22/2022)   Humiliation, Afraid, Rape, and Kick questionnaire    Fear of Current or Ex-Partner: No    Emotionally Abused: No    Physically Abused: No    Sexually Abused: No     PHYSICAL EXAM Today's Vitals   10/12/22 1348  BP: (!) 141/89  Pulse: 86  Weight: 251 lb 8 oz (114.1 kg)  Height: 5\' 11"  (1.803 m)    Body mass index is  35.08 kg/m.     General: The patient is well-developed and well-nourished and in no acute distress, he has a VP shunt bulb on the left   Neck: He has mild tenderness at the occiput.  Good range of motion..  No weakness.   Skin: Extremities are without significant edema.  Psoriasis is well controlled.  Neurologic Exam  Mental status: The patient is alert and oriented x 3 at the time of the examination.  No ptosis.  The patient has apparent normal recent and remote memory, with an apparently normal attention span and concentration ability.   Speech is normal.  Cranial nerves: Extraocular movements are full.  Facial strength and sensation is normal.  Neck strength is normal.  Hearing is normal.  Motor: 5/5 strength  Coordination: Cerebellar testing shows good finger-nose-finger  Gait and station: Station is normal.  The gait is arthritic.  Tandem  gait is wide.  Romberg is negative.     DIAGNOSTIC DATA (LABS, IMAGING, TESTING) - I reviewed patient records, labs, notes, testing and imaging myself where available.  Lab Results  Component Value Date   WBC 5.2 07/23/2022   HGB 13.3 07/23/2022   HCT 39.1 07/23/2022   MCV 89.6 07/23/2022   PLT 143.0 (L) 07/23/2022      Component Value Date/Time   NA 139 09/01/2022 1107   NA 143 07/06/2022 0000   K 4.4 09/01/2022 1107   CL 106 09/01/2022 1107   CO2 21 09/01/2022 1107   GLUCOSE 184 (H) 09/01/2022 1107   BUN 24 (H) 09/01/2022 1107   BUN 21 07/06/2022 0000   CREATININE 1.55 (H) 09/01/2022 1107   CALCIUM 9.4 09/01/2022 1107   PROT 7.7 01/30/2022 1007   ALBUMIN 4.4 07/06/2022 0000   AST 23 07/06/2022 0000   ALT 19 07/06/2022 0000   ALKPHOS 78 07/06/2022 0000   BILITOT 1.5 (H) 01/30/2022 1007   GFRNONAA 52 (L) 01/01/2022 0315   GFRAA 66 08/08/2020 1442   Lab Results  Component Value Date   CHOL 137 01/30/2022   HDL 42.10 01/30/2022   LDLCALC 68 01/30/2022   LDLDIRECT 67.0 12/31/2020   TRIG 137.0 01/30/2022   CHOLHDL 3  01/30/2022   Lab Results  Component Value Date   HGBA1C 7.7 (H) 09/01/2022   Lab Results  Component Value Date   VITAMINB12 490 06/10/2018   Lab Results  Component Value Date   TSH 1.22 06/26/2021       ASSESSMENT AND PLAN  Neck pain  Chronic migraine w/o aura, not intractable, w/o stat migr - Plan: botulinum toxin Type A (BOTOX) 200 units injection  OSA -- dx ~ 2012, cpap intolerant  Presence of cerebrospinal fluid drainage device  REM behavioral disorder  1.  Botox 155 units as follows: Frontalis muscle (5 units x 4), corrugators and procerus (5 units x 3), temporalis (5 units x 8), occipitalis (5 units x 4), splenius capitis (12.5 units x 2), splenius cervicis (10 units x 2), C6-C7 paraspinal (5 units x 2), trapezius (12.5 units x 2).  45 units wasted.   2.     Continue clonazepam 0.5 mg for RBD and insomnia 3.  He was unable to tolerate CPAP and does not wish to retry.    We have discussed the inspire device but he is not interested at this time. 4.   rtc 6 months, sooner if new or worsening issues.  Jarelyn Bambach A. Epimenio Foot, MD, [facility] 10/12/2022, 3:45 PM Certified in Neurology, Clinical Neurophysiology, Sleep Medicine, Pain Medicine and Neuroimaging  [facility] [address] [place], [place] [address] [phone]

## 2023-01-05 ENCOUNTER — Other Ambulatory Visit (HOSPITAL_COMMUNITY): Payer: Self-pay

## 2023-01-06 ENCOUNTER — Other Ambulatory Visit (HOSPITAL_COMMUNITY): Payer: Self-pay

## 2023-01-07 ENCOUNTER — Ambulatory Visit: Payer: 59 | Admitting: Endocrinology

## 2023-01-07 NOTE — Telephone Encounter (Signed)
Pt called back and clarified his understanding to hold his Plavix for 5 days prior to his procedure.

## 2023-01-07 NOTE — Telephone Encounter (Signed)
L/M ON V/M

## 2023-01-14 ENCOUNTER — Other Ambulatory Visit (HOSPITAL_COMMUNITY): Payer: Self-pay

## 2023-01-15 ENCOUNTER — Other Ambulatory Visit (HOSPITAL_COMMUNITY): Payer: Self-pay

## 2023-01-19 ENCOUNTER — Encounter: Payer: Self-pay | Admitting: Internal Medicine

## 2023-01-19 ENCOUNTER — Ambulatory Visit (AMBULATORY_SURGERY_CENTER): Payer: 59 | Admitting: Internal Medicine

## 2023-01-19 VITALS — BP 116/77 | HR 61 | Temp 98.0°F | Resp 22 | Ht 71.0 in | Wt 245.0 lb

## 2023-01-19 DIAGNOSIS — Z09 Encounter for follow-up examination after completed treatment for conditions other than malignant neoplasm: Secondary | ICD-10-CM | POA: Diagnosis not present

## 2023-01-19 DIAGNOSIS — K766 Portal hypertension: Secondary | ICD-10-CM | POA: Diagnosis not present

## 2023-01-19 DIAGNOSIS — K7460 Unspecified cirrhosis of liver: Secondary | ICD-10-CM | POA: Diagnosis not present

## 2023-01-19 DIAGNOSIS — F32A Depression, unspecified: Secondary | ICD-10-CM | POA: Diagnosis not present

## 2023-01-19 DIAGNOSIS — D122 Benign neoplasm of ascending colon: Secondary | ICD-10-CM

## 2023-01-19 DIAGNOSIS — D123 Benign neoplasm of transverse colon: Secondary | ICD-10-CM

## 2023-01-19 DIAGNOSIS — I10 Essential (primary) hypertension: Secondary | ICD-10-CM | POA: Diagnosis not present

## 2023-01-19 DIAGNOSIS — Z86010 Personal history of colonic polyps: Secondary | ICD-10-CM

## 2023-01-19 DIAGNOSIS — K7581 Nonalcoholic steatohepatitis (NASH): Secondary | ICD-10-CM | POA: Diagnosis not present

## 2023-01-19 MED ORDER — SODIUM CHLORIDE 0.9 % IV SOLN
500.0000 mL | Freq: Once | INTRAVENOUS | Status: DC
Start: 2023-01-19 — End: 2023-01-19

## 2023-01-19 NOTE — Progress Notes (Signed)
A and O x3. Report to RN. Tolerated MAC anesthesia well.Gums unchanged after procedure. 

## 2023-01-19 NOTE — Addendum Note (Signed)
Addended by: Estella Husk on: 01/19/2023 04:57 PM   Modules accepted: Orders

## 2023-01-19 NOTE — Progress Notes (Signed)
Expand All Collapse All HISTORY OF PRESENT ILLNESS:   James [name] is a 67 y.o. male  with MULTIPLE SIGNIFICANT medical problems as listed below.  He has been followed in this office for history of compensated NASH cirrhosis, GERD, diverticulitis, and prior cholecystectomy.  He also has GAVE and a history of iron deficiency anemia secondary to the same.  He also has morbid obesity, coronary artery disease with stent placement, now on Plavix, and diabetes with complications including the need for left great toe amputation this summer.  Patient was last seen in this office May 26, 2022.  See that dictation.  The impression and plan from that visit is outlined as follows:    ASSESSMENT:   1.  NASH cirrhosis.  Compensated.  MELD score 10 2.  GAVE.  Associated iron deficiency anemia.  Corrected with iron replacement 3.  Upper endoscopy 2021.  No varices 4.  Colonoscopy 2021 with adenomatous polyps 5.  GERD.  Symptoms controlled with PPI 6.  Multiple significant medical problems including coronary artery disease on Plavix and diabetes mellitus with complications     PLAN:   1.  Exercise and weight loss 2.  Continue iron replacement 3.  Follow-up surveillance ultrasound of the liver to rule out HCC. 4.  Ongoing general medical care with Dr. Drue Novel 5.  Routine GI office follow-up 6 months.  At that time he will be due for surveillance colonoscopy and upper endoscopy.  Contact the office in the interim for any questions or problems   James [name] presents today for follow-up.  Previous colonoscopy in 2021 revealed multiple advanced adenomatous polyps.  Follow-up in 3 years recommended.  His last upper endoscopy was negative for varices.  He is due for surveillance of both examinations.  He is on Coreg for hypertension.  Resting heart rate 80, today.  Review of blood work from Nov 18, 2022 shows normal liver test.  Sodium 137.  CBC with hemoglobin 14.6.  Platelets 186,000.  INR 1 year ago 1.1.  Most  recent abdominal ultrasound December 03, 2022 shows cirrhotic morphology of the liver.  However, no focal hepatic lesion he did undergo a CT scan May 2024.  Again, no focal hepatic thickening.  He did have changes of sigmoid diverticulitis.  He was having left lower quadrant pain at that time.  He was treated with antibiotics.  His pain has resolved.  He was told to follow-up in this office         REVIEW OF SYSTEMS:   All non-GI ROS negative unless otherwise stated in the HPI except for arthritis, back pain, headaches, excessive urination       Past Medical History:  Diagnosis Date   Abnormal cardiac CT angiography     Acid reflux     Annual physical exam 04/08/2015   Arthritis     Atypical chest pain 06/13/2020   Blood transfusion without reported diagnosis     Body mass index (BMI) 35.0-35.9, adult 04/05/2019   Chronic fatigue 01/28/2015   Chronic headaches      on cymbalta   Chronic migraine w/o aura, not intractable, w/o stat migr 10/24/2018   Cirrhosis (HCC)     Colon polyps     Complication of anesthesia      problems waking up from anesthesia   Coronary artery disease 01/23/2019   Depression      on cymbalta   Diabetes mellitus with neuropathy (HCC)     Diabetes with neuropathy 04/25/2013   Diverticulitis 03/2013  Dyslipidemia 05/29/2019   Eczema     Elevated LFTs     Epidural lipomatosis 10/05/2018   Essential (primary) hypertension 04/05/2019   Essential hypertension 10/10/2019   Fatty liver     GERD (gastroesophageal reflux disease) 04/28/2011   H/O craniotomy 05/07/2015   Headache 04/28/2011   Hepatitis 10/2017    NASH cirrhosis   History of kidney stones     Hyperlipidemia     Hypersomnia with sleep apnea 01/28/2015   Hypertension     IDA (iron deficiency anemia) 01/24/2019   Idiopathic intracranial hypertension 01/14/2017   Insomnia 04/26/2013   Kidney stone     Liver cirrhosis secondary to NASH (nonalcoholic steatohepatitis) (HCC) 01/02/2016   Low back pain 04/05/2019    Lower back injury 08/14/2019   Morbid obesity (HCC)     Neuromuscular disorder (HCC)      neuropathy   Neuropathy     Nonalcoholic steatohepatitis 10/05/2018   Obstructive hydrocephalus (HCC) 01/28/2015   OSA -- dx ~ 2012, cpap intolerant 09/04/2014     dx ~ 2012, cpap intolerant    PCP NOTES >>> 04/08/2015   Post-op pain 03/19/2019   Post-traumatic hydrocephalus (HCC)      s/p shunts x 2 (first got infected )   Presence of cerebrospinal fluid drainage device 07/28/2011   Psoriasis      sees Dr Lenis Noon   Psoriatic arthritis (HCC)     REM behavioral disorder 01/14/2017   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    see's Dr.Ortmann   Severe obesity (BMI >= 40) (HCC) 01/28/2015   SI (sacroiliac) joint dysfunction 08/14/2019   Sigmoid diverticulitis 04/25/2013   Sleep apnea      no CPAP      Spondylolisthesis, lumbar region 03/16/2019   Stomach ulcer     Testosterone deficiency 04/28/2011   VP (ventriculoperitoneal) shunt status 07/31/2020               Past Surgical History:  Procedure Laterality Date   AMPUTATION Left 12/27/2021    Procedure: AMPUTATION GREAT TOE;  Surgeon: Vivi Barrack, DPM;  Location: [facility] OR;  Service: Podiatry;  Laterality: Left;   BACK SURGERY   1980   BRAIN SURGERY        VP shunts placed in 2007   CHOLECYSTECTOMY N/A 08/25/2017    Procedure: LAPAROSCOPIC CHOLECYSTECTOMY WITH INTRAOPERATIVE CHOLANGIOGRAM;  Surgeon: Griselda Miner, MD;  Location: [facility] OR;  Service: General;  Laterality: N/A;   COLONOSCOPY       CORONARY STENT INTERVENTION N/A 09/27/2020    Procedure: CORONARY STENT INTERVENTION;  Surgeon: Corky Crafts, MD;  Location: [facility] INVASIVE CV LAB;  Service: Cardiovascular;  Laterality: N/A;   CORONARY ULTRASOUND/IVUS N/A 09/27/2020    Procedure: Intravascular Ultrasound/IVUS;  Surgeon: Corky Crafts, MD;  Location: [facility] INVASIVE CV LAB;  Service: Cardiovascular;  Laterality: N/A;   JOINT REPLACEMENT        total hip   LEFT HEART CATH  N/A 09/27/2020    Procedure: Left Heart Cath;  Surgeon: Corky Crafts, MD;  Location: [facility] INVASIVE CV LAB;  Service: Cardiovascular;  Laterality: N/A;   LEFT HEART CATH AND CORONARY ANGIOGRAPHY N/A 09/24/2020    Procedure: LEFT HEART CATH AND CORONARY ANGIOGRAPHY;  Surgeon: Corky Crafts, MD;  Location[facility] INVASIVE CV LAB;  Service: Cardiovascular;  Laterality: N/A;   LEFT HEART CATH AND CORONARY ANGIOGRAPHY N/A 08/18/2021    Procedure: LEFT HEART CATH AND CORONARY ANGIOGRAPHY;  Surgeon: Nicki Guadalajara  A, MD;  Location: [facility] INVASIVE CV LAB;  Service: Cardiovascular;  Laterality: N/A;   LUMBAR FUSION   03/16/2019   SHOULDER SURGERY Left 2010   TEE WITHOUT CARDIOVERSION N/A 01/01/2022    Procedure: TRANSESOPHAGEAL ECHOCARDIOGRAM (TEE);  Surgeon: Thomasene Ripple, DO;  Location: [facility] ENDOSCOPY;  Service: Cardiovascular;  Laterality: N/A;   TOE SURGERY Left 2018   TONSILLECTOMY        as a child   TOTAL HIP ARTHROPLASTY Left 2011   UPPER GASTROINTESTINAL ENDOSCOPY   01/04/2020   VENTRICULOPERITONEAL SHUNT   2007    x2          Social History James [name]  reports that he has never smoked. He has never used smokeless tobacco. He reports that he does not drink alcohol and does not use drugs.   family history includes Down syndrome in his son; Heart disease in his brother; Lung cancer in his father; Other in his brother and mother.   Allergies       Allergies  Allergen Reactions   Bee Venom Anaphylaxis   Hydrocodone Bit-Homatrop Mbr Other (See Comments)      Hallucinations, confusion, delirium Depressed feeling   Toradol [Ketorolac Tromethamine] Other (See Comments)      Hallucinations, confusion, delirium   Prednisone        Patient reports it causes cirrhosis to flare up   Sulfadiazine        NDC ONGE:95284132440 NDC NUUV:[id number] NDC QQVZ:[id number]   Morphine And Codeine Other (See Comments)      Hallucinations, back in the 80s. States has taken vicodin before w/o  problems    Sulfa Drugs Cross Reactors Rash            PHYSICAL EXAMINATION: Vital signs: BP 124/68   Pulse 80   Ht 5\' 11"  (1.803 m)   Wt 245 lb 9.6 oz (111.4 kg)   SpO2 96%   BMI 34.25 kg/m   Constitutional: generally well-appearing, no acute distress Psychiatric: alert and oriented x3, cooperative Eyes: extraocular movements intact, anicteric, conjunctiva pink Mouth: oral pharynx moist, no lesions Neck: supple no lymphadenopathy Cardiovascular: heart regular rate and rhythm, no murmur Lungs: clear to auscultation bilaterally Abdomen: soft, nontender, nondistended, no obvious ascites, no peritoneal signs, normal bowel sounds, no organomegaly Rectal: Deferred to colonoscopy Extremities: no clubbing, cyanosis, or lower extremity edema bilaterally Skin: no lesions on visible extremities Neuro: No focal deficits.  Cranial nerves intact   ASSESSMENT:   1.  NASH cirrhosis.  Compensated.  MELD score 10 2.  GAVE.  Associated iron deficiency anemia.  Corrected with iron replacement 3.  Upper endoscopy 2021.  No varices.  Due for surveillance 4.  Colonoscopy 2021 with adenomatous polyps.  Due for surveillance 5.  GERD.  Symptoms controlled with PPI 6.  Multiple significant medical problems including coronary artery disease on Plavix and diabetes mellitus with complications 7.  Recent bout of uncomplicated diverticulitis clinically and on CT.  Resolved with antibiotics     PLAN:   1.  Exercise and weight loss 2.  Continue iron replacement 3.  Follow-up surveillance ultrasound of the liver to rule out HCC, in 6 months. 4.  Schedule surveillance upper endoscopy.  The patient is high risk given his comorbidities. 5.  Schedule surveillance colonoscopy.  Patient is high risk as above 6.  Hold Plavix 5 days prior to the procedure.  We will confirm with his cardiologist if this is acceptable. 7.  Adjust diabetic medications for his procedure to  avoid unwanted hypoglycemia 8.  Ongoing  general medical care with Dr. Drue Novel 9.  Routine GI office follow-up 6 months after the above completed.

## 2023-01-19 NOTE — Op Note (Signed)
[facility] Patient Name: James [name] Procedure Date: 01/19/2023 2:24 PM MRN: [id number] Endoscopist: Wilhemina Bonito. Marina Goodell , MD, [id number] Age: 67 Referring MD:  Date of Birth: [birth year] Gender: Male Account #: [id number] Procedure:                Colonoscopy with cold snare polypectomy x 2; biopsy                            polypectomy x 1 Indications:              High risk colon cancer surveillance: Personal                            history of adenoma (10 mm or greater in size), High                            risk colon cancer surveillance: Personal history of                            multiple adenomas. Previous examination 2021 Medicines:                Monitored Anesthesia Care Procedure:                Pre-Anesthesia Assessment:                           - Prior to the procedure, a History and Physical                            was performed, and patient medications and                            allergies were reviewed. The patient's tolerance of                            previous anesthesia was also reviewed. The risks                            and benefits of the procedure and the sedation                            options and risks were discussed with the patient.                            All questions were answered, and informed consent                            was obtained. Prior Anticoagulants: The patient has                            taken Plavix (clopidogrel), last dose was 6 days                            prior to procedure. ASA Grade Assessment: III - A  patient with severe systemic disease. After                            reviewing the risks and benefits, the patient was                            deemed in satisfactory condition to undergo the                            procedure.                           After obtaining informed consent, the colonoscope                            was passed under direct vision.  Throughout the                            procedure, the patient's blood pressure, pulse, and                            oxygen saturations were monitored continuously. The                            Olympus Scope [id number] was introduced through the                            anus and advanced to the the cecum, identified by                            appendiceal orifice and ileocecal valve. The                            ileocecal valve, appendiceal orifice, and rectum                            were photographed. The quality of the bowel                            preparation was excellent. The colonoscopy was                            performed without difficulty. The patient tolerated                            the procedure well. The bowel preparation used was                            SUPREP via split dose instruction. Scope In: 2:35:10 PM Scope Out: 2:46:36 PM Scope Withdrawal Time: 0 hours 9 minutes 45 seconds  Total Procedure Duration: 0 hours 11 minutes 26 seconds  Findings:                 Two polyps were found in the transverse colon and  ascending colon. The polyps were 3 to 7 mm in size.                            These polyps were removed with a cold snare.                            Resection and retrieval were complete.                           A 1 mm polyp was found in the transverse colon. The                            polyp was removed with a jumbo cold forceps.                            Resection and retrieval were complete.                           Multiple diverticula were found in the entire colon.                           Internal hemorrhoids were found during                            retroflexion. The hemorrhoids were moderate.                           The exam was otherwise without abnormality on                            direct and retroflexion views. Complications:            No immediate complications. Estimated blood  loss:                            None. Estimated Blood Loss:     Estimated blood loss: none. Impression:               - Two 3 to 7 mm polyps in the transverse colon and                            in the ascending colon, removed with a cold snare.                            Resected and retrieved.                           - One 1 mm polyp in the transverse colon, removed                            with a jumbo cold forceps. Resected and retrieved.                           - Diverticulosis in the entire examined colon.                           -  Internal hemorrhoids.                           - The examination was otherwise normal on direct                            and retroflexion views. Recommendation:           - Repeat colonoscopy in 5 years for surveillance.                           - Resume Plavix (clopidogrel) today at prior dose.                           - Patient has a contact number available for                            emergencies. The signs and symptoms of potential                            delayed complications were discussed with the                            patient. Return to normal activities tomorrow.                            Written discharge instructions were provided to the                            patient.                           - Resume previous diet.                           - Continue present medications.                           - Await pathology results. Wilhemina Bonito. Marina Goodell, MD 01/19/2023 2:54:10 PM This report has been signed electronically.

## 2023-01-19 NOTE — Op Note (Signed)
[facility] Patient Name: James [name] Procedure Date: 01/19/2023 2:13 PM MRN: [id number] Endoscopist: Wilhemina Bonito. Marina Goodell , MD, [id number] Age: 67 Referring MD:  Date of Birth: [birth year] Gender: Male Account #: [id number] Procedure:                Upper GI endoscopy Indications:              Cirrhosis rule out esophageal varices Medicines:                Monitored Anesthesia Care Procedure:                Pre-Anesthesia Assessment:                           - Prior to the procedure, a History and Physical                            was performed, and patient medications and                            allergies were reviewed. The patient's tolerance of                            previous anesthesia was also reviewed. The risks                            and benefits of the procedure and the sedation                            options and risks were discussed with the patient.                            All questions were answered, and informed consent                            was obtained. Prior Anticoagulants: The patient has                            taken Plavix (clopidogrel), last dose was 7 days                            prior to procedure. ASA Grade Assessment: III - A                            patient with severe systemic disease. After                            reviewing the risks and benefits, the patient was                            deemed in satisfactory condition to undergo the                            procedure.  After obtaining informed consent, the endoscope was                            passed under direct vision. Throughout the                            procedure, the patient's blood pressure, pulse, and                            oxygen saturations were monitored continuously. The                            GIF [id number] #[id number] was introduced through the                            mouth, and advanced to the second part of  duodenum.                            The upper GI endoscopy was accomplished without                            difficulty. The patient tolerated the procedure                            well. Scope In: Scope Out: Findings:                 The esophagus revealed 1 trace varix distally                            without stigmata. The esophagus was otherwise                            normal.                           The stomach revealed mild portal hypertensive                            gastropathy. 1 small inflammatory appearing polyp.                            Stomach was otherwise normal. No proximal gastric                            varices.                           The examined duodenum was normal.                           The cardia and gastric fundus were normal on                            retroflexion. Complications:            No immediate complications. Estimated Blood Loss:  Estimated blood loss: none. Impression:               1. Trace esophageal varix                           2. Mild portal hypertensive gastropathy                           3. Otherwise unremarkable EGD. Recommendation:           - Patient has a contact number available for                            emergencies. The signs and symptoms of potential                            delayed complications were discussed with the                            patient. Return to normal activities tomorrow.                            Written discharge instructions were provided to the                            patient.                           - Resume previous diet.                           - Continue present medications.                           - Resume Plavix (clopidogrel) at prior dose today.                           ?" Repeat screening EGD in 2 years Cloyd Ragas N. Marina Goodell, MD 01/19/2023 3:04:55 PM This report has been signed electronically.

## 2023-01-19 NOTE — Progress Notes (Signed)
Called to room to assist during endoscopic procedure.  Patient ID and intended procedure confirmed with present staff. Received instructions for my participation in the procedure from the performing physician.  

## 2023-01-19 NOTE — Progress Notes (Signed)
Pt's states no medical or surgical changes since previsit or office visit. 

## 2023-01-19 NOTE — Patient Instructions (Signed)
You may resume Plavix as normal today 01/19/23   YOU HAD AN ENDOSCOPIC PROCEDURE TODAY AT THE [facility]:   Refer to the procedure report that was given to you for any specific questions about what was found during the examination.  If the procedure report does not answer your questions, please call your gastroenterologist to clarify.  If you requested that your care partner not be given the details of your procedure findings, then the procedure report has been included in a sealed envelope for you to review at your convenience later.  YOU SHOULD EXPECT: Some feelings of bloating in the abdomen. Passage of more gas than usual.  Walking can help get rid of the air that was put into your GI tract during the procedure and reduce the bloating. If you had a lower endoscopy (such as a colonoscopy or flexible sigmoidoscopy) you may notice spotting of blood in your stool or on the toilet paper. If you underwent a bowel prep for your procedure, you may not have a normal bowel movement for a few days.  Please Note:  You might notice some irritation and congestion in your nose or some drainage.  This is from the oxygen used during your procedure.  There is no need for concern and it should clear up in a day or so.  SYMPTOMS TO REPORT IMMEDIATELY:  Following lower endoscopy (colonoscopy or flexible sigmoidoscopy):  Excessive amounts of blood in the stool  Significant tenderness or worsening of abdominal pains  Swelling of the abdomen that is new, acute  Fever of 100F or higher  Following upper endoscopy (EGD)  Vomiting of blood or coffee ground material  New chest pain or pain under the shoulder blades  Painful or persistently difficult swallowing  New shortness of breath  Fever of 100F or higher  Black, tarry-looking stools  For urgent or emergent issues, a gastroenterologist can be reached at any hour by calling [phone]. Do not use MyChart messaging for urgent concerns.     DIET:  We do recommend a small meal at first, but then you may proceed to your regular diet.  Drink plenty of fluids but you should avoid alcoholic beverages for 24 hours.  ACTIVITY:  You should plan to take it easy for the rest of today and you should NOT DRIVE or use heavy machinery until tomorrow (because of the sedation medicines used during the test).    FOLLOW UP: Our staff will call the number listed on your records the next business day following your procedure.  We will call around 7:15- 8:00 am to check on you and address any questions or concerns that you may have regarding the information given to you following your procedure. If we do not reach you, we will leave a message.     If any biopsies were taken you will be contacted by phone or by letter within the next 1-3 weeks.  Please call us at [phone] if you have not heard about the biopsies in 3 weeks.    SIGNATURES/CONFIDENTIALITY: You and/or your care partner have signed paperwork which will be entered into your electronic medical record.  These signatures attest to the fact that that the information above on your After Visit Summary has been reviewed and is understood.  Full responsibility of the confidentiality of this discharge information lies with you and/or your care-partner.

## 2023-01-20 ENCOUNTER — Telehealth: Payer: Self-pay

## 2023-01-20 ENCOUNTER — Encounter: Payer: Self-pay | Admitting: Internal Medicine

## 2023-01-20 ENCOUNTER — Ambulatory Visit (INDEPENDENT_AMBULATORY_CARE_PROVIDER_SITE_OTHER): Payer: 59 | Admitting: Internal Medicine

## 2023-01-20 ENCOUNTER — Other Ambulatory Visit (HOSPITAL_COMMUNITY): Payer: Self-pay

## 2023-01-20 VITALS — BP 126/68 | HR 88 | Temp 97.9°F | Resp 18 | Ht 71.0 in | Wt 245.5 lb

## 2023-01-20 DIAGNOSIS — I10 Essential (primary) hypertension: Secondary | ICD-10-CM

## 2023-01-20 DIAGNOSIS — E782 Mixed hyperlipidemia: Secondary | ICD-10-CM

## 2023-01-20 DIAGNOSIS — E1140 Type 2 diabetes mellitus with diabetic neuropathy, unspecified: Secondary | ICD-10-CM | POA: Diagnosis not present

## 2023-01-20 LAB — LIPID PANEL
Cholesterol: 132 mg/dL (ref 0–200)
HDL: 41 mg/dL (ref >39.00)
NonHDL: 91.33
Total CHOL/HDL Ratio: 3
Triglycerides: 240 mg/dL — ABNORMAL HIGH (ref 0.0–149.0)
VLDL: 48 mg/dL — ABNORMAL HIGH (ref 0.0–40.0)

## 2023-01-20 LAB — MICROALBUMIN / CREATININE URINE RATIO
Creatinine,U: 111.2 mg/dL
Microalb Creat Ratio: 0.6 mg/g (ref 0.0–30.0)
Microalb, Ur: 0.7 mg/dL (ref 0.0–1.9)

## 2023-01-20 LAB — LDL CHOLESTEROL, DIRECT: Direct LDL: 59 mg/dL

## 2023-01-20 NOTE — Patient Instructions (Addendum)
Vaccines I recommend to be done at your pharmacy: Shingrix (shingles) RSV vaccine Flu shot this fall  Check the  blood pressure regularly BP GOAL is between 110/65 and  135/85. If it is consistently higher or lower, let me know      GO TO THE LAB : Get the blood work     GO TO THE FRONT DESK, PLEASE SCHEDULE YOUR APPOINTMENTS Come back for   a physical exam in 6 months   Per our records you are due for your diabetic eye exam. Please contact your eye doctor to schedule an appointment. Please have them send copies of your office visit notes to [place]. Our fax number is [phone]. If you need a referral to an eye doctor please let us know.  Please bring or send [place] a copy of your Healthcare Power of Attorney for your chart.

## 2023-01-20 NOTE — Assessment & Plan Note (Signed)
DM: s  A1c was 7.03 September 2022.  Saw Endo last month.  Check micro. HTN: No recent ambulatory BPs, readings today are very good.  Continue amlodipine, carvedilol, and recommend to monitor at home.  Not on ARB's d/t h/o  acute kidney injury. CRI: Used to see nephrology,  last creatinine decreased from 1.9-1.4. Dyslipidemia: Check FLP, continue atorvastatin, fenofibrate. Fatigue: Possibly multifactorial, OSA untreated.  See next OSA: Has CPAP equipment at home, is not willing to retry a CPAP .Made him aware of newer treatment modalities such as inspire, he is absolutely certain he is not interested. GI: Saw  GI 11/2022, all GI issues addressed. Had a colonoscopy and upper endoscopy yesterday Vaccine advice provided RTC 6 months CPX

## 2023-01-20 NOTE — Progress Notes (Signed)
Subjective:    Patient ID: James [name], male    DOB: [birth year], 67 y.o.   MRN: [id number]  DOS:  01/20/2023 Type of visit - description: ROV  Chronic medical problems addressed His only concern is ongoing fatigue, "tired all the time". This is not new. PHQ-9 score 12, he tells me that he is not depressed just tired. Denies chest pain, no difficulty breathing No abdominal pain, nausea or vomiting. No cough or chest congestion  BP Readings from Last 3 Encounters:  01/20/23 126/68  01/19/23 116/77  12/21/22 124/68     Review of Systems See above   Past Medical History:  Diagnosis Date   Abnormal cardiac CT angiography    Acid reflux    Annual physical exam 04/08/2015   Arthritis    Atypical chest pain 06/13/2020   Blood transfusion without reported diagnosis    Body mass index (BMI) 35.0-35.9, adult 04/05/2019   Chronic fatigue 01/28/2015   Chronic headaches    on cymbalta   Chronic migraine w/o aura, not intractable, w/o stat migr 10/24/2018   Cirrhosis (HCC)    Colon polyps    Complication of anesthesia    problems waking up from anesthesia   Coronary artery disease 01/23/2019   Depression    on cymbalta   Diabetes mellitus with neuropathy (HCC)    Diabetes with neuropathy 04/25/2013   Diverticulitis 03/2013   Dyslipidemia 05/29/2019   Eczema    Elevated LFTs    Epidural lipomatosis 10/05/2018   Essential (primary) hypertension 04/05/2019   Essential hypertension 10/10/2019   Fatty liver    GERD (gastroesophageal reflux disease) 04/28/2011   H/O craniotomy 05/07/2015   Headache 04/28/2011   Hepatitis 10/2017   NASH cirrhosis   History of kidney stones    Hyperlipidemia    Hypersomnia with sleep apnea 01/28/2015   Hypertension    IDA (iron deficiency anemia) 01/24/2019   Idiopathic intracranial hypertension 01/14/2017   Insomnia 04/26/2013   Kidney stone    Liver cirrhosis secondary to NASH (nonalcoholic steatohepatitis) (HCC) 01/02/2016   Low back pain  04/05/2019   Lower back injury 08/14/2019   Morbid obesity (HCC)    Neuromuscular disorder (HCC)    neuropathy   Neuropathy    Nonalcoholic steatohepatitis 10/05/2018   Obstructive hydrocephalus (HCC) 01/28/2015   OSA -- dx ~ 2012, cpap intolerant 09/04/2014    dx ~ 2012, cpap intolerant    PCP NOTES >>> 04/08/2015   Post-op pain 03/19/2019   Post-traumatic hydrocephalus (HCC)    s/p shunts x 2 (first got infected )   Presence of cerebrospinal fluid drainage device 07/28/2011   Psoriasis    sees Dr Lenis Noon   Psoriatic arthritis (HCC)    REM behavioral disorder 01/14/2017   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   see's Dr.Ortmann   Severe obesity (BMI >= 40) (HCC) 01/28/2015   SI (sacroiliac) joint dysfunction 08/14/2019   Sigmoid diverticulitis 04/25/2013   Sleep apnea    no CPAP      Spondylolisthesis, lumbar region 03/16/2019   Stomach ulcer    Testosterone deficiency 04/28/2011   VP (ventriculoperitoneal) shunt status 07/31/2020    Past Surgical History:  Procedure Laterality Date   AMPUTATION Left 12/27/2021   Procedure: AMPUTATION GREAT TOE;  Surgeon: Vivi Barrack, DPM;  Location: [facility] OR;  Service: Podiatry;  Laterality: Left;   BACK SURGERY  1980   BRAIN SURGERY     VP shunts placed in 2007   CHOLECYSTECTOMY  N/A 08/25/2017   Procedure: LAPAROSCOPIC CHOLECYSTECTOMY WITH INTRAOPERATIVE CHOLANGIOGRAM;  Surgeon: Griselda Miner, MD;  Location: [facility] OR;  Service: General;  Laterality: N/A;   COLONOSCOPY     CORONARY STENT INTERVENTION N/A 09/27/2020   Procedure: CORONARY STENT INTERVENTION;  Surgeon: Corky Crafts, MD;  Location: [facility] INVASIVE CV LAB;  Service: Cardiovascular;  Laterality: N/A;   CORONARY ULTRASOUND/IVUS N/A 09/27/2020   Procedure: Intravascular Ultrasound/IVUS;  Surgeon: Corky Crafts, MD;  Location[facility] INVASIVE CV LAB;  Service: Cardiovascular;  Laterality: N/A;   JOINT REPLACEMENT     total hip   LEFT HEART CATH N/A 09/27/2020   Procedure: Left  Heart Cath;  Surgeon: Corky Crafts, MD;  Location: [facility] INVASIVE CV LAB;  Service: Cardiovascular;  Laterality: N/A;   LEFT HEART CATH AND CORONARY ANGIOGRAPHY N/A 09/24/2020   Procedure: LEFT HEART CATH AND CORONARY ANGIOGRAPHY;  Surgeon: Corky Crafts, MD;  Location: [facility] INVASIVE CV LAB;  Service: Cardiovascular;  Laterality: N/A;   LEFT HEART CATH AND CORONARY ANGIOGRAPHY N/A 08/18/2021   Procedure: LEFT HEART CATH AND CORONARY ANGIOGRAPHY;  Surgeon: Lennette Bihari, MD;  Location: [facility] INVASIVE CV LAB;  Service: Cardiovascular;  Laterality: N/A;   LUMBAR FUSION  03/16/2019   SHOULDER SURGERY Left 2010   TEE WITHOUT CARDIOVERSION N/A 01/01/2022   Procedure: TRANSESOPHAGEAL ECHOCARDIOGRAM (TEE);  Surgeon: Thomasene Ripple, DO;  Location: [facility] ENDOSCOPY;  Service: Cardiovascular;  Laterality: N/A;   TOE SURGERY Left 2018   TONSILLECTOMY     as a child   TOTAL HIP ARTHROPLASTY Left 2011   UPPER GASTROINTESTINAL ENDOSCOPY  01/04/2020   VENTRICULOPERITONEAL SHUNT  2007   x2    Current Outpatient Medications  Medication Instructions   acetaminophen (TYLENOL) 650 mg, Oral, Every 4 hours PRN   Adalimumab (HUMIRA, 2 PEN,) 40 MG/0.8ML PNKT Inject 40 mg (0.8 ml) under the skin every other week   amLODipine (NORVASC) 5 mg, Oral, Daily   aspirin 81 mg, Oral, Daily   atorvastatin (LIPITOR) 80 mg, Oral, Daily at bedtime   botulinum toxin Type A (BOTOX) 200 units injection Provider to inject 155 units into the muscles of the head and neck every 3 months. Discard remainder.   carvedilol (COREG) 12.5 mg, Oral, 2 times daily with meals   clobetasol (TEMOVATE) 0.05 % external solution Apply 1 application externally twice a day 14 days.   clonazePAM (KLONOPIN) 0.5 mg, Oral, Daily at bedtime   clopidogrel (PLAVIX) 75 mg, Oral, Daily   Continuous Glucose Sensor (DEXCOM G6 SENSOR) MISC Use to monitor blood sugar, change after 10 days   DULoxetine (CYMBALTA) 120 mg, Oral, Daily   EPINEPHrine (EPIPEN 2-PAK)  0.3 mg, Intramuscular, Once PRN   fenofibrate micronized (LOFIBRA) 134 mg, Oral, 3 times weekly   gabapentin (NEURONTIN) 600 mg, Oral, 2 times daily   glimepiride (AMARYL) 4 mg, Oral, Daily before breakfast   Insulin Disposable Pump (OMNIPOD 5 G6 PODS, GEN 5,) MISC Use as directed every 3 (three) days.   insulin lispro (HUMALOG) 100 UNIT/ML injection Use up to 60 Units daily in pump   Insulin Syringe-Needle U-100 (INSULIN SYRINGE 1CC/31GX5/16") 31G X 5/16" 1 ML MISC Use to administer Humalog 3 times a day   isosorbide mononitrate (IMDUR) 120 mg, Oral, Daily   Jardiance 10 mg, Oral, Daily with breakfast   liraglutide (VICTOZA) 18 MG/3ML SOPN Start with 0.6mg  subcutaneously once a day for 7 days, then increase to 1.2mg  once a day   metFORMIN (GLUCOPHAGE) 1,000 mg, Oral,  2 times daily   nitroGLYCERIN (NITROSTAT) 0.4 MG SL tablet Place 1 tablet (0.4 mg total) under the tongue every 5 (five) minutes for 3 doses as needed for chest pain.   ondansetron (ZOFRAN) 4 mg, Oral, Every 6 hours PRN   pantoprazole (PROTONIX) 40 mg, Oral, Daily   traMADol (ULTRAM) 50 mg, Oral, Every 6 hours PRN       Objective:   Physical Exam BP 126/68   Pulse 88   Temp 97.9 F (36.6 C) (Oral)   Resp 18   Ht 5\' 11"  (1.803 m)   Wt 245 lb 8 oz (111.4 kg)   SpO2 95%   BMI 34.24 kg/m  General:   Well developed, NAD, BMI noted. HEENT:  Normocephalic . Face symmetric, atraumatic Lungs:  CTA B Normal respiratory effort, no intercostal retractions, no accessory muscle use. Heart: RRR,  no murmur.  Lower extremities: no pretibial edema bilaterally  Skin: Not pale. Not jaundice Neurologic:  alert & oriented X3.  Speech normal, gait appropriate for age and unassisted Psych--  Cognition and judgment appear intact.  Cooperative with normal attention span and concentration.  Behavior appropriate. No anxious or depressed appearing.      Assessment     Assessment  DM - Dr Lucianne Muss Neuropathy (x years, rx gaba  05-2014, w/u 11-2014  RPR neg, vit D-B12-Folic Acid wnl ); Saw Dr Allena Katz, NCS [phone] (see results) HTN: History of AKI with ARBs  CRI, used to see  nephrology, etiology- Hyperglycemia, intermittent NSAIDs, contrast exposures Hyperlipidemia (TG in the 500s 2016) OSA , dx 2012, sleep study again 02-2015 Dr Dohmeier--> severe OSA, intolerant to CPAP Depression, insomnia: on Cymbalta NEURO: --Chronic headaches :on Cymbalta  --Posttraumatic hydrocephalus s/p 2 shunts (first got infected) MSK: on disability d/t back pain- HAs GI:  --GERD, diverticulitis 2014, h/o PUD -- NASH with cirrhosis per GI note 10/2017, s/p Hep A/B shots --Anemia: - felt to be d/t   GAVE (gastric antral vascular ectasia) and a inflammatory polyp, s/p  EGD 10/2018    -Work-up repeated 10/2019: EGD: GAVE  versus portal hypertensive gastropathy. Colonoscopy polyps.  Tubular adenoma. Gastric BX negative H. pylori, reactive changes. Psoriasis, psoriatic arthritis: used  HUMIRA  CAD: CP, PTCA of the LAD 09-2020; cath 07-2021 H/o urolithiasis Hypogonadism  Dx 2012, normal T 11-2014 (on no RX)  PLAN DM: s  A1c was 7.03 September 2022.  Saw Endo last month.  Check micro. HTN: No recent ambulatory BPs, readings today are very good.  Continue amlodipine, carvedilol, and recommend to monitor at home.  Not on ARB's d/t h/o  acute kidney injury. CRI: Used to see nephrology,  last creatinine decreased from 1.9-1.4. Dyslipidemia: Check FLP, continue atorvastatin, fenofibrate. Fatigue: Possibly multifactorial, OSA untreated.  See next OSA: Has CPAP equipment at home, is not willing to retry a CPAP .Made him aware of newer treatment modalities such as inspire, he is absolutely certain he is not interested. GI: Saw  GI 11/2022, all GI issues addressed. Had a colonoscopy and upper endoscopy yesterday Vaccine advice provided RTC 6 months CPX

## 2023-01-20 NOTE — Telephone Encounter (Signed)
  Follow up Call-     01/19/2023    1:24 PM  Call back number  Post procedure Call Back phone  # [phone]  Permission to leave phone message Yes     Patient questions:  Do you have a fever, pain , or abdominal swelling? No. Pain Score  0 *  Have you tolerated food without any problems? Yes.    Have you been able to return to your normal activities? Yes.    Do you have any questions about your discharge instructions: Diet   No. Medications  No. Follow up visit  No.  Do you have questions or concerns about your Care? No.  Actions: * If pain score is 4 or above: No action needed, pain <4.

## 2023-01-21 ENCOUNTER — Other Ambulatory Visit (HOSPITAL_COMMUNITY): Payer: Self-pay

## 2023-01-27 ENCOUNTER — Other Ambulatory Visit (HOSPITAL_COMMUNITY): Payer: Self-pay

## 2023-01-27 ENCOUNTER — Other Ambulatory Visit: Payer: Self-pay

## 2023-01-27 ENCOUNTER — Other Ambulatory Visit: Payer: Self-pay | Admitting: Pharmacist

## 2023-01-27 MED ORDER — HUMIRA (2 PEN) 40 MG/0.8ML ~~LOC~~ AJKT: AUTO-INJECTOR | SUBCUTANEOUS | 2 refills | Status: DC

## 2023-01-27 MED ORDER — HUMIRA (2 PEN) 40 MG/0.8ML ~~LOC~~ AJKT
AUTO-INJECTOR | SUBCUTANEOUS | 2 refills | Status: DC
  Filled 2023-01-27: qty 1.6, 28d supply, fill #0
  Filled 2023-02-19: qty 1.6, 28d supply, fill #1
  Filled 2023-03-16: qty 1.6, 28d supply, fill #2

## 2023-01-28 ENCOUNTER — Other Ambulatory Visit (HOSPITAL_BASED_OUTPATIENT_CLINIC_OR_DEPARTMENT_OTHER): Payer: Self-pay

## 2023-01-28 ENCOUNTER — Other Ambulatory Visit: Payer: Self-pay

## 2023-01-28 DIAGNOSIS — H25043 Posterior subcapsular polar age-related cataract, bilateral: Secondary | ICD-10-CM | POA: Diagnosis not present

## 2023-01-28 DIAGNOSIS — H35371 Puckering of macula, right eye: Secondary | ICD-10-CM | POA: Diagnosis not present

## 2023-01-28 DIAGNOSIS — H18413 Arcus senilis, bilateral: Secondary | ICD-10-CM | POA: Diagnosis not present

## 2023-01-28 DIAGNOSIS — H11152 Pinguecula, left eye: Secondary | ICD-10-CM | POA: Diagnosis not present

## 2023-01-28 DIAGNOSIS — E119 Type 2 diabetes mellitus without complications: Secondary | ICD-10-CM | POA: Diagnosis not present

## 2023-01-28 LAB — HM DIABETES EYE EXAM

## 2023-01-29 ENCOUNTER — Ambulatory Visit: Payer: 59 | Admitting: Physician Assistant

## 2023-02-05 ENCOUNTER — Other Ambulatory Visit (HOSPITAL_BASED_OUTPATIENT_CLINIC_OR_DEPARTMENT_OTHER): Payer: Self-pay

## 2023-02-05 ENCOUNTER — Other Ambulatory Visit: Payer: Self-pay | Admitting: Endocrinology

## 2023-02-05 ENCOUNTER — Other Ambulatory Visit: Payer: Self-pay | Admitting: *Deleted

## 2023-02-05 ENCOUNTER — Other Ambulatory Visit: Payer: Self-pay

## 2023-02-05 DIAGNOSIS — E1165 Type 2 diabetes mellitus with hyperglycemia: Secondary | ICD-10-CM

## 2023-02-05 MED ORDER — METFORMIN HCL 500 MG PO TABS
1000.0000 mg | ORAL_TABLET | Freq: Two times a day (BID) | ORAL | 0 refills | Status: DC
Start: 2023-02-05 — End: 2023-03-15
  Filled 2023-02-05: qty 120, 30d supply, fill #0

## 2023-02-07 ENCOUNTER — Encounter: Payer: Self-pay | Admitting: Internal Medicine

## 2023-02-07 NOTE — Progress Notes (Signed)
James [name], This pathology report never made it to my in box. The referring physician is listed as " [email or web address]". Please make [place] and Trish aware.  I am copying Dr. Luisa Hart of pathology and Dr. Lavon Paganini as [facility] director. Thanks for picking this up. JP

## 2023-02-10 ENCOUNTER — Ambulatory Visit (INDEPENDENT_AMBULATORY_CARE_PROVIDER_SITE_OTHER): Payer: 59 | Admitting: Internal Medicine

## 2023-02-10 VITALS — BP 100/80 | HR 80 | Temp 98.3°F | Resp 18 | Ht 71.0 in | Wt 241.0 lb

## 2023-02-10 DIAGNOSIS — R197 Diarrhea, unspecified: Secondary | ICD-10-CM | POA: Diagnosis not present

## 2023-02-10 LAB — POC COVID19 BINAXNOW: SARS Coronavirus 2 Ag: NEGATIVE

## 2023-02-10 NOTE — Progress Notes (Signed)
Subjective:    Patient ID: James [name], male    DOB: 1955-10-04, 67 y.o.   MRN: [id number]  DOS:  02/10/2023 Type of visit - description: Acute visit  Symptoms started 4 days ago: Feeling chills, "feeling hot and cold". Has not checked his temperature. Developed diarrhea, watery, 2-3 episodes a day, no blood in the stools. Has a global headache, not the worst of his life. No other family members affected. BP is slightly low, admits he is not eating or drinking much. Mild dizziness when he stands up.  Denies sore throat, sinus congestion.  No myalgias Mild nausea without vomiting. No cough.   BP Readings from Last 3 Encounters:  02/10/23 100/80  01/20/23 126/68  01/19/23 116/77     Review of Systems See above   Past Medical History:  Diagnosis Date   Abnormal cardiac CT angiography    Acid reflux    Annual physical exam 04/08/2015   Arthritis    Atypical chest pain 06/13/2020   Blood transfusion without reported diagnosis    Body mass index (BMI) 35.0-35.9, adult 04/05/2019   Chronic fatigue 01/28/2015   Chronic headaches    on cymbalta   Chronic migraine w/o aura, not intractable, w/o stat migr 10/24/2018   Cirrhosis (HCC)    Colon polyps    Complication of anesthesia    problems waking up from anesthesia   Coronary artery disease 01/23/2019   Depression    on cymbalta   Diabetes mellitus with neuropathy (HCC)    Diabetes with neuropathy 04/25/2013   Diverticulitis 03/2013   Dyslipidemia 05/29/2019   Eczema    Elevated LFTs    Epidural lipomatosis 10/05/2018   Essential (primary) hypertension 04/05/2019   Essential hypertension 10/10/2019   Fatty liver    GERD (gastroesophageal reflux disease) 04/28/2011   H/O craniotomy 05/07/2015   Headache 04/28/2011   Hepatitis 10/2017   NASH cirrhosis   History of kidney stones    Hyperlipidemia    Hypersomnia with sleep apnea 01/28/2015   Hypertension    IDA (iron deficiency anemia) 01/24/2019   Idiopathic  intracranial hypertension 01/14/2017   Insomnia 04/26/2013   Kidney stone    Liver cirrhosis secondary to NASH (nonalcoholic steatohepatitis) (HCC) 01/02/2016   Low back pain 04/05/2019   Lower back injury 08/14/2019   Morbid obesity (HCC)    Neuromuscular disorder (HCC)    neuropathy   Neuropathy    Nonalcoholic steatohepatitis 10/05/2018   Obstructive hydrocephalus (HCC) 01/28/2015   OSA -- dx ~ 2012, cpap intolerant 09/04/2014    dx ~ 2012, cpap intolerant    PCP NOTES >>> 04/08/2015   Post-op pain 03/19/2019   Post-traumatic hydrocephalus (HCC)    s/p shunts x 2 (first got infected )   Presence of cerebrospinal fluid drainage device 07/28/2011   Psoriasis    sees Dr Lenis Noon   Psoriatic arthritis (HCC)    REM behavioral disorder 01/14/2017   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   see's Dr.Ortmann   Severe obesity (BMI >= 40) (HCC) 01/28/2015   SI (sacroiliac) joint dysfunction 08/14/2019   Sigmoid diverticulitis 04/25/2013   Sleep apnea    no CPAP      Spondylolisthesis, lumbar region 03/16/2019   Stomach ulcer    Testosterone deficiency 04/28/2011   VP (ventriculoperitoneal) shunt status 07/31/2020    Past Surgical History:  Procedure Laterality Date   AMPUTATION Left 12/27/2021   Procedure: AMPUTATION GREAT TOE;  Surgeon: Vivi Barrack, DPM;  Location:  [facility] OR;  Service: Podiatry;  Laterality: Left;   BACK SURGERY  1980   BRAIN SURGERY     VP shunts placed in 2007   CHOLECYSTECTOMY N/A 08/25/2017   Procedure: LAPAROSCOPIC CHOLECYSTECTOMY WITH INTRAOPERATIVE CHOLANGIOGRAM;  Surgeon: Griselda Miner, MD;  Location: [facility] OR;  Service: General;  Laterality: N/A;   COLONOSCOPY     CORONARY STENT INTERVENTION N/A 09/27/2020   Procedure: CORONARY STENT INTERVENTION;  Surgeon: Corky Crafts, MD;  Location: [facility] INVASIVE CV LAB;  Service: Cardiovascular;  Laterality: N/A;   CORONARY ULTRASOUND/IVUS N/A 09/27/2020   Procedure: Intravascular Ultrasound/IVUS;  Surgeon: Corky Crafts, MD;  Location: [facility] INVASIVE CV LAB;  Service: Cardiovascular;  Laterality: N/A;   JOINT REPLACEMENT     total hip   LEFT HEART CATH N/A 09/27/2020   Procedure: Left Heart Cath;  Surgeon: Corky Crafts, MD;  Location: [facility] INVASIVE CV LAB;  Service: Cardiovascular;  Laterality: N/A;   LEFT HEART CATH AND CORONARY ANGIOGRAPHY N/A 09/24/2020   Procedure: LEFT HEART CATH AND CORONARY ANGIOGRAPHY;  Surgeon: Corky Crafts, MD;  Location: [facility] INVASIVE CV LAB;  Service: Cardiovascular;  Laterality: N/A;   LEFT HEART CATH AND CORONARY ANGIOGRAPHY N/A 08/18/2021   Procedure: LEFT HEART CATH AND CORONARY ANGIOGRAPHY;  Surgeon: Lennette Bihari, MD;  Location: [facility] INVASIVE CV LAB;  Service: Cardiovascular;  Laterality: N/A;   LUMBAR FUSION  03/16/2019   SHOULDER SURGERY Left 2010   TEE WITHOUT CARDIOVERSION N/A 01/01/2022   Procedure: TRANSESOPHAGEAL ECHOCARDIOGRAM (TEE);  Surgeon: Thomasene Ripple, DO;  Location: [facility] ENDOSCOPY;  Service: Cardiovascular;  Laterality: N/A;   TOE SURGERY Left 2018   TONSILLECTOMY     as a child   TOTAL HIP ARTHROPLASTY Left 2011   UPPER GASTROINTESTINAL ENDOSCOPY  01/04/2020   VENTRICULOPERITONEAL SHUNT  2007   x2    Current Outpatient Medications  Medication Instructions   acetaminophen (TYLENOL) 650 mg, Oral, Every 4 hours PRN   adalimumab (HUMIRA, 2 PEN,) 40 MG/0.8ML PNKT pen Inject 40 mg (0.8 ml) under the skin every other week   amLODipine (NORVASC) 5 mg, Oral, Daily   aspirin 81 mg, Oral, Daily   atorvastatin (LIPITOR) 80 mg, Oral, Daily at bedtime   botulinum toxin Type A (BOTOX) 200 units injection Provider to inject 155 units into the muscles of the head and neck every 3 months. Discard remainder.   carvedilol (COREG) 12.5 mg, Oral, 2 times daily with meals   clobetasol (TEMOVATE) 0.05 % external solution Apply 1 application externally twice a day 14 days.   clonazePAM (KLONOPIN) 0.5 mg, Oral, Daily at bedtime   clopidogrel (PLAVIX) 75 mg, Oral,  Daily   Continuous Glucose Sensor (DEXCOM G6 SENSOR) MISC Use to monitor blood sugar, change after 10 days   DULoxetine (CYMBALTA) 120 mg, Oral, Daily   EPINEPHrine (EPIPEN 2-PAK) 0.3 mg, Intramuscular, Once PRN   fenofibrate micronized (LOFIBRA) 134 mg, Oral, 3 times weekly   gabapentin (NEURONTIN) 600 mg, Oral, 2 times daily   glimepiride (AMARYL) 4 mg, Oral, Daily before breakfast   Insulin Disposable Pump (OMNIPOD 5 G6 PODS, GEN 5,) MISC Use as directed every 3 (three) days.   insulin lispro (HUMALOG) 100 UNIT/ML injection Use up to 60 Units daily in pump   Insulin Syringe-Needle U-100 (INSULIN SYRINGE 1CC/31GX5/16") 31G X 5/16" 1 ML MISC Use to administer Humalog 3 times a day   isosorbide mononitrate (IMDUR) 120 mg, Oral, Daily   Jardiance 10 mg, Oral, Daily with breakfast  liraglutide (VICTOZA) 18 MG/3ML SOPN Start with 0.6mg  subcutaneously once a day for 7 days, then increase to 1.2mg  once a day   metFORMIN (GLUCOPHAGE) 1,000 mg, Oral, 2 times daily   nitroGLYCERIN (NITROSTAT) 0.4 MG SL tablet Place 1 tablet (0.4 mg total) under the tongue every 5 (five) minutes for 3 doses as needed for chest pain.   ondansetron (ZOFRAN) 4 mg, Oral, Every 6 hours PRN   pantoprazole (PROTONIX) 40 mg, Oral, Daily   traMADol (ULTRAM) 50 mg, Oral, Every 6 hours PRN       Objective:   Physical Exam BP 100/80 (BP Location: Right Arm, Patient Position: Sitting, Cuff Size: Normal)   Pulse 80   Temp 98.3 F (36.8 C) (Oral)   Resp 18   Ht 5\' 11"  (1.803 m)   Wt 241 lb (109.3 kg)   SpO2 97%   BMI 33.61 kg/m  General:   Well developed, NAD, BMI noted.  HEENT:  Normocephalic . Face symmetric, atraumatic.  Not pale Oral membranes slightly dry Lungs:  CTA B Normal respiratory effort, no intercostal retractions, no accessory muscle use. Heart: RRR,  no murmur.  Abdomen:  Not distended, soft, non-tender. No rebound or rigidity.   Skin: Lesions consistent with psoriasis noted Lower extremities: no  pretibial edema bilaterally  Neurologic:  alert & oriented X3.  Speech normal, gait appropriate for age and unassisted Psych--  Cognition and judgment appear intact.  Cooperative with normal attention span and concentration.  Behavior appropriate. No anxious or depressed appearing.     Assessment     Assessment  DM - Dr Lucianne Muss Neuropathy (x years, rx gaba 05-2014, w/u 11-2014  RPR neg, vit D-B12-Folic Acid wnl ); Saw Dr Allena Katz, NCS [phone] (see results) HTN: History of AKI with ARBs  CRI, used to see  nephrology, etiology- Hyperglycemia, intermittent NSAIDs, contrast exposures Hyperlipidemia (TG in the 500s 2016) OSA , dx 2012, sleep study again 02-2015 Dr Dohmeier--> severe OSA, intolerant to CPAP Depression, insomnia: on Cymbalta NEURO: --Chronic headaches :on Cymbalta  --Posttraumatic hydrocephalus s/p 2 shunts (first got infected) MSK: on disability d/t back pain- HAs GI:  --GERD, diverticulitis 2014, h/o PUD -- NASH with cirrhosis per GI note 10/2017, s/p Hep A/B shots --Anemia: - felt to be d/t   GAVE (gastric antral vascular ectasia) and a inflammatory polyp, s/p  EGD 10/2018    -Work-up repeated 10/2019: EGD: GAVE  versus portal hypertensive gastropathy. Colonoscopy polyps.  Tubular adenoma. Gastric BX negative H. pylori, reactive changes. Psoriasis, psoriatic arthritis: used  HUMIRA  CAD: CP, PTCA of the LAD 09-2020; cath 07-2021 H/o urolithiasis Hypogonadism  Dx 2012, normal T 11-2014 (on no RX)  PLAN Acute diarrhea: Symptoms started 4 days ago, associated with some nausea and a headache.  Essentially no respiratory symptoms. BP is slightly low, admits he is not drinking or eating much. COVID test yesterday at home negative, COVID test today: Negative Orthostatic vital signs: Mild tachycardia with standing. Most likely the patient has acute viral diarrhea, mildly dehydrated. Plan: Imodium as needed, push fluids, call if not gradually better, hold Jardiance for 2 to 3  days. If blood sugar are 120 or less, hold glimepiride for 2 to 3 days. Call if not gradually better

## 2023-02-10 NOTE — Assessment & Plan Note (Signed)
Acute diarrhea: Symptoms started 4 days ago, associated with some nausea and a headache.  Essentially no respiratory symptoms. BP is slightly low, admits he is not drinking or eating much. COVID test yesterday at home negative, COVID test today: Negative Orthostatic vital signs: Mild tachycardia with standing. Most likely the patient has acute viral diarrhea, mildly dehydrated. Plan: Imodium as needed, push fluids, call if not gradually better, hold Jardiance for 2 to 3 days. If blood sugar are 120 or less, hold glimepiride for 2 to 3 days. Call if not gradually better

## 2023-02-10 NOTE — Patient Instructions (Signed)
You are mildly dehydrated  Your COVID test was negative  Recommend the following:  Rest  Drink plenty of water  Check your blood sugar twice daily, if is 120 or less: Hold glimepiride for 2 to 3 days  Hold Jardiance for 2 to 3 days  Call if not gradually better and if not back to normal in 4 to 5 days.

## 2023-02-11 ENCOUNTER — Encounter: Payer: Self-pay | Admitting: Endocrinology

## 2023-02-11 ENCOUNTER — Ambulatory Visit (INDEPENDENT_AMBULATORY_CARE_PROVIDER_SITE_OTHER): Payer: 59 | Admitting: Endocrinology

## 2023-02-11 ENCOUNTER — Encounter (INDEPENDENT_AMBULATORY_CARE_PROVIDER_SITE_OTHER): Payer: Self-pay

## 2023-02-11 VITALS — BP 95/70 | HR 86 | Ht 71.0 in | Wt 240.2 lb

## 2023-02-11 DIAGNOSIS — E1165 Type 2 diabetes mellitus with hyperglycemia: Secondary | ICD-10-CM

## 2023-02-11 DIAGNOSIS — Z794 Long term (current) use of insulin: Secondary | ICD-10-CM | POA: Diagnosis not present

## 2023-02-11 LAB — POCT GLYCOSYLATED HEMOGLOBIN (HGB A1C): Hemoglobin A1C: 7.3 % — AB (ref 4.0–5.6)

## 2023-02-11 LAB — BASIC METABOLIC PANEL
BUN: 26 mg/dL — ABNORMAL HIGH (ref 6–23)
CO2: 21 mEq/L (ref 19–32)
Calcium: 9.9 mg/dL (ref 8.4–10.5)
Chloride: 102 mEq/L (ref 96–112)
Creatinine, Ser: 2.29 mg/dL — ABNORMAL HIGH (ref 0.40–1.50)
GFR: 29.34 mL/min — ABNORMAL LOW (ref >60.00)
Glucose, Bld: 137 mg/dL — ABNORMAL HIGH (ref 70–99)
Potassium: 4.5 mEq/L (ref 3.5–5.1)
Sodium: 136 mEq/L (ref 135–145)

## 2023-02-11 NOTE — Progress Notes (Addendum)
Outpatient Endocrinology Note [place] Elyssa Pendelton, MD  02/11/23  Patient's Name: James [name]    DOB: Jan 01, 1956    MRN: [id number]                                                    REASON OF VISIT: Follow up for type 2 diabetes mellitus  PCP: Wanda Plump, MD  HISTORY OF PRESENT ILLNESS:   James [name] is a 67 y.o. old male with with past medical history listed below, is here for follow up of type 2 diabetes mellitus.   Pertinent Diabetes History: He was diagnosed with type 2 diabetes mellitus around 2014.  He was initially treated with metformin and glimepiride.  With the worsening of blood sugar control multidose insulin regimen was started around 2017.  He has been on OmniPod insulin pump from November 2021.  Chronic Diabetes Complications : Retinopathy: no. Last ophthalmology exam was done on annually, reportedly. Nephropathy: CKD, no microalbuminuria. Peripheral neuropathy: yes, on gabapentin Coronary artery disease: no Stroke: no  Relevant comorbidities and cardiovascular risk factors: Obesity: yes Body mass index is 33.5 kg/m.  Hypertension: yes Hyperlipidemia. Yes, on a statin. He has been following with gastroenterology for liver cirrhosis secondary to NASH.  Current / Home Diabetic regimen includes: Metformin 1000 mg 2 times a day. Jardiance 10 mg daily.  Insulin Pump setting:  OMNIPOD 5 insulin pump settings as follows with Dexcom G6. Basal MN- 0.1u/hour 6AM- 0.3  11AM- 0.5 7PM- 0.8  Bolus CHO Ratio (1unit:CHO) MN- 1:1  Correction/Sensitivity: MN- 1:50  Target:   Active insulin time:  hours  Prior diabetic medications: Amaryl was stopped June 2024. Januvia  Glycemic data:    CONTINUOUS GLUCOSE MONITORING SYSTEM (CGMS) INTERPRETATION: At today's visit, we reviewed CGM downloads. The full report is scanned in the media. Reviewing the CGM trends, blood glucose are as follows:  Dexcom G6 CGM-  Sensor Download (Sensor download was reviewed and  summarized below.) Dates: August 2 to February 11, 2023 for 14 days  Glucose Management Indicator: 7% Sensor Average: 154 SD 49 CGM/Sensor usage:93%  Glycemic Trends:  <54: 0% 54-70: 0% 71-180: 76% 181-250: 18% 251-400: 6%  Interpretation: -Mostly acceptable blood sugar occasional hyperglycemia postprandially up to 250 range related with meals especially with supper and some time with lunch and breakfast.  Acceptable overnight blood sugar.  No hypoglycemia.  Hypoglycemia: Patient has no hypoglycemic episodes. Patient has hypoglycemia awareness.     Factors modifying glucose control: 1.  Diabetic diet assessment: Not eating much due to abdominal pain/diarrhea/acute gastroenteritis.  2.  Staying active or exercising: No formal exercise.  3.  Medication compliance: compliant most of the time.  Interval history 02/11/23 Patient is having abdominal pain and diarrhea, had seen primary care provider and thought to have acute gastroenteritis.  Patient denies fever.  However he feels hot and cold.  Diarrhea is some what better but he is still feeling dizziness and not able to eat much.  He has nausea, denies vomiting.  Patient is currently not taking Jardiance, with a concern of dehydration.  He has also been not using insulin pump for 1 week as he has not been eating much.  Dexcom CGM data as reviewed above mostly acceptable blood sugar with occasional postprandial hyperglycemia.  He did not bring the pump and the pump receiver,  not able to review setting and download in the clinic today.  REVIEW OF SYSTEMS As per history of present illness.   PAST MEDICAL HISTORY: Past Medical History:  Diagnosis Date   Abnormal cardiac CT angiography    Acid reflux    Annual physical exam 04/08/2015   Arthritis    Atypical chest pain 06/13/2020   Blood transfusion without reported diagnosis    Body mass index (BMI) 35.0-35.9, adult 04/05/2019   Chronic fatigue 01/28/2015   Chronic headaches    on  cymbalta   Chronic migraine w/o aura, not intractable, w/o stat migr 10/24/2018   Cirrhosis (HCC)    Colon polyps    Complication of anesthesia    problems waking up from anesthesia   Coronary artery disease 01/23/2019   Depression    on cymbalta   Diabetes mellitus with neuropathy (HCC)    Diabetes with neuropathy 04/25/2013   Diverticulitis 03/2013   Dyslipidemia 05/29/2019   Eczema    Elevated LFTs    Epidural lipomatosis 10/05/2018   Essential (primary) hypertension 04/05/2019   Essential hypertension 10/10/2019   Fatty liver    GERD (gastroesophageal reflux disease) 04/28/2011   H/O craniotomy 05/07/2015   Headache 04/28/2011   Hepatitis 10/2017   NASH cirrhosis   History of kidney stones    Hyperlipidemia    Hypersomnia with sleep apnea 01/28/2015   Hypertension    IDA (iron deficiency anemia) 01/24/2019   Idiopathic intracranial hypertension 01/14/2017   Insomnia 04/26/2013   Kidney stone    Liver cirrhosis secondary to NASH (nonalcoholic steatohepatitis) (HCC) 01/02/2016   Low back pain 04/05/2019   Lower back injury 08/14/2019   Morbid obesity (HCC)    Neuromuscular disorder (HCC)    neuropathy   Neuropathy    Nonalcoholic steatohepatitis 10/05/2018   Obstructive hydrocephalus (HCC) 01/28/2015   OSA -- dx ~ 2012, cpap intolerant 09/04/2014    dx ~ 2012, cpap intolerant    PCP NOTES >>> 04/08/2015   Post-op pain 03/19/2019   Post-traumatic hydrocephalus (HCC)    s/p shunts x 2 (first got infected )   Presence of cerebrospinal fluid drainage device 07/28/2011   Psoriasis    sees Dr Lenis Noon   Psoriatic arthritis (HCC)    REM behavioral disorder 01/14/2017   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   see's Dr.Ortmann   Severe obesity (BMI >= 40) (HCC) 01/28/2015   SI (sacroiliac) joint dysfunction 08/14/2019   Sigmoid diverticulitis 04/25/2013   Sleep apnea    no CPAP      Spondylolisthesis, lumbar region 03/16/2019   Stomach ulcer    Testosterone deficiency  04/28/2011   VP (ventriculoperitoneal) shunt status 07/31/2020    PAST SURGICAL HISTORY: Past Surgical History:  Procedure Laterality Date   AMPUTATION Left 12/27/2021   Procedure: AMPUTATION GREAT TOE;  Surgeon: Vivi Barrack, DPM;  Location: [facility] OR;  Service: Podiatry;  Laterality: Left;   BACK SURGERY  1980   BRAIN SURGERY     VP shunts placed in 2007   CHOLECYSTECTOMY N/A 08/25/2017   Procedure: LAPAROSCOPIC CHOLECYSTECTOMY WITH INTRAOPERATIVE CHOLANGIOGRAM;  Surgeon: Griselda Miner, MD;  Location: [facility] OR;  Service: General;  Laterality: N/A;   COLONOSCOPY     CORONARY STENT INTERVENTION N/A 09/27/2020   Procedure: CORONARY STENT INTERVENTION;  Surgeon: Corky Crafts, MD;  Location: [facility] INVASIVE CV LAB;  Service: Cardiovascular;  Laterality: N/A;   CORONARY ULTRASOUND/IVUS N/A 09/27/2020   Procedure: Intravascular Ultrasound/IVUS;  Surgeon: Corky Crafts,  MD;  Location: [facility] INVASIVE CV LAB;  Service: Cardiovascular;  Laterality: N/A;   JOINT REPLACEMENT     total hip   LEFT HEART CATH N/A 09/27/2020   Procedure: Left Heart Cath;  Surgeon: Corky Crafts, MD;  Location[facility] INVASIVE CV LAB;  Service: Cardiovascular;  Laterality: N/A;   LEFT HEART CATH AND CORONARY ANGIOGRAPHY N/A 09/24/2020   Procedure: LEFT HEART CATH AND CORONARY ANGIOGRAPHY;  Surgeon: Corky Crafts, MD;  Location[facility] INVASIVE CV LAB;  Service: Cardiovascular;  Laterality: N/A;   LEFT HEART CATH AND CORONARY ANGIOGRAPHY N/A 08/18/2021   Procedure: LEFT HEART CATH AND CORONARY ANGIOGRAPHY;  Surgeon: Lennette Bihari, MD;  Location: [facility] INVASIVE CV LAB;  Service: Cardiovascular;  Laterality: N/A;   LUMBAR FUSION  03/16/2019   SHOULDER SURGERY Left 2010   TEE WITHOUT CARDIOVERSION N/A 01/01/2022   Procedure: TRANSESOPHAGEAL ECHOCARDIOGRAM (TEE);  Surgeon: Thomasene Ripple, DO;  Location: [facility] ENDOSCOPY;  Service: Cardiovascular;  Laterality: N/A;   TOE SURGERY Left 2018   TONSILLECTOMY     as a child   TOTAL  HIP ARTHROPLASTY Left 2011   UPPER GASTROINTESTINAL ENDOSCOPY  01/04/2020   VENTRICULOPERITONEAL SHUNT  2007   x2    ALLERGIES: Allergies  Allergen Reactions   Bee Venom Anaphylaxis   Hydrocodone Bit-Homatrop Mbr Other (See Comments)    Hallucinations, confusion, delirium Depressed feeling   Toradol [Ketorolac Tromethamine] Other (See Comments)    Hallucinations, confusion, delirium   Prednisone     Patient reports it causes cirrhosis to flare up   Sulfadiazine     NDC OVFI:[id number] NDC YSAY:[id number] NDC FTDD:[id number]   Morphine And Codeine Other (See Comments)    Hallucinations, back in the 80s. States has taken vicodin before w/o problems    Sulfa Drugs Cross Reactors Rash    FAMILY HISTORY:  Family History  Problem Relation Age of Onset   Other Mother    Lung cancer Father        alive, former smoker    Heart disease Brother        MI age 53   Other Brother        Murdered   Down syndrome Son    Diabetes Neg Hx    Prostate cancer Neg Hx    Colon cancer Neg Hx    Stomach cancer Neg Hx    Pancreatic cancer Neg Hx    Liver disease Neg Hx     SOCIAL HISTORY: Social History   Socioeconomic History   Marital status: Married    Spouse name: Burna Mortimer   Number of children: 2   Years of education: Not on file   Highest education level: 12th grade  Occupational History   Occupation: disabled   Tobacco Use   Smoking status: Never   Smokeless tobacco: Never  Vaping Use   Vaping status: Never Used  Substance and Sexual Activity   Alcohol use: No   Drug use: No   Sexual activity: Yes    Partners: Female  Other Topics Concern   Not on file  Social History Narrative   Household-- pt , wife, one adult son with Down's syndrome   younger son lives in [place]   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   Social Determinants of Health   Financial Resource Strain: Low Risk  (02/10/2023)   Overall Financial Resource Strain  (CARDIA)    Difficulty of Paying Living Expenses: Not hard at all  Food  Insecurity: No Food Insecurity (02/10/2023)   Hunger Vital Sign    Worried About Running Out of Food in the Last Year: Never true    Ran Out of Food in the Last Year: Never true  Transportation Needs: No Transportation Needs (02/10/2023)   PRAPARE - Administrator, Civil Service (Medical): No    Lack of Transportation (Non-Medical): No  Physical Activity: Unknown (02/10/2023)   Exercise Vital Sign    Days of Exercise per Week: 0 days    Minutes of Exercise per Session: Not on file  Stress: No Stress Concern Present (02/10/2023)   [employer] of Occupational Health - Occupational Stress Questionnaire    Feeling of Stress : Only a little  Social Connections: Moderately Isolated (02/10/2023)   Social Connection and Isolation Panel [NHANES]    Frequency of Communication with Friends and Family: More than three times a week    Frequency of Social Gatherings with Friends and Family: Three times a week    Attends Religious Services: Never    Active Member of Clubs or Organizations: No    Attends Engineer, structural: Not on file    Marital Status: Married    MEDICATIONS:  Current Outpatient Medications  Medication Sig Dispense Refill   adalimumab (HUMIRA, 2 PEN,) 40 MG/0.8ML PNKT pen Inject 40 mg (0.8 ml) under the skin every other week 1.6 mL 2   amLODipine (NORVASC) 5 MG tablet Take 1 tablet (5 mg total) by mouth daily. 90 tablet 3   aspirin 81 MG chewable tablet Chew 1 tablet (81 mg total) by mouth daily.     atorvastatin (LIPITOR) 80 MG tablet Take 1 tablet (80 mg total) by mouth at bedtime. 90 tablet 1   botulinum toxin Type A (BOTOX) 200 units injection Provider to inject 155 units into the muscles of the head and neck every 3 months. Discard remainder. 1 each 2   carvedilol (COREG) 12.5 MG tablet Take 1 tablet (12.5 mg total) by mouth 2 (two) times daily with a meal. 180 tablet 1    clobetasol (TEMOVATE) 0.05 % external solution Apply 1 application externally twice a day 14 days. 50 mL 1   clonazePAM (KLONOPIN) 0.5 MG tablet Take 1 tablet (0.5 mg total) by mouth at bedtime. 30 tablet 5   clopidogrel (PLAVIX) 75 MG tablet Take 1 tablet (75 mg total) by mouth daily. 90 tablet 3   Continuous Glucose Sensor (DEXCOM G6 SENSOR) MISC Use to monitor blood sugar, change after 10 days (Patient taking differently: 1 each by Other route See admin instructions. Use to monitor blood sugar, change after 10 days) 3 each 3   DULoxetine (CYMBALTA) 60 MG capsule Take 2 capsules (120 mg total) by mouth daily. 180 capsule 1   EPINEPHrine (EPIPEN 2-PAK) 0.3 mg/0.3 mL IJ SOAJ injection Inject 0.3 mLs (0.3 mg total) into the muscle once as needed for up to 1 dose for anaphylaxis. 2 each 2   fenofibrate micronized (LOFIBRA) 134 MG capsule Take 1 capsule (134 mg total) by mouth 3 (three) times a week. 15 capsule 3   gabapentin (NEURONTIN) 600 MG tablet Take 1 tablet (600 mg total) by mouth 2 (two) times daily. 180 tablet 1   glimepiride (AMARYL) 4 MG tablet Take 1 tablet (4 mg total) by mouth daily before breakfast. 90 tablet 3   Insulin Disposable Pump (OMNIPOD 5 G6 PODS, GEN 5,) MISC Use as directed every 3 (three) days. 10 each 3   insulin lispro (  HUMALOG) 100 UNIT/ML injection Use up to 60 Units daily in pump (Patient taking differently: Inject 60 Units into the skin See admin instructions. Use up to 60 Units daily in pump) 20 mL 2   Insulin Syringe-Needle U-100 (INSULIN SYRINGE 1CC/31GX5/16") 31G X 5/16" 1 ML MISC Use to administer Humalog 3 times a day (Patient taking differently: 1 tablet by Other route See admin instructions. Use to administer Humalog 3 times a day) 100 each 0   isosorbide mononitrate (IMDUR) 120 MG 24 hr tablet Take 1 tablet (120 mg total) by mouth daily. 90 tablet 3   liraglutide (VICTOZA) 18 MG/3ML SOPN Start with 0.6mg  subcutaneously once a day for 7 days, then increase to 1.2mg   once a day 6 mL 2   metFORMIN (GLUCOPHAGE) 500 MG tablet Take 2 tablets (1,000 mg total) by mouth 2 (two) times daily. 120 tablet 0   nitroGLYCERIN (NITROSTAT) 0.4 MG SL tablet Place 1 tablet (0.4 mg total) under the tongue every 5 (five) minutes for 3 doses as needed for chest pain. 25 tablet 6   ondansetron (ZOFRAN) 4 MG tablet Take 1 tablet (4 mg total) by mouth every 6 (six) hours as needed for nausea or vomiting. 20 tablet 0   pantoprazole (PROTONIX) 40 MG tablet Take 1 tablet (40 mg total) by mouth daily. 90 tablet 1   acetaminophen (TYLENOL) 325 MG tablet Take 2 tablets (650 mg total) by mouth every 4 (four) hours as needed for headache or mild pain. (Patient not taking: Reported on 02/11/2023)     empagliflozin (JARDIANCE) 10 MG TABS tablet Take 1 tablet (10 mg total) by mouth daily with breakfast. (Patient not taking: Reported on 02/11/2023) 30 tablet 3   traMADol (ULTRAM) 50 MG tablet Take 1 tablet (50 mg total) by mouth every 6 (six) hours as needed for up to 10 doses for severe pain. (Patient not taking: Reported on 01/19/2023) 10 tablet 0   Current Facility-Administered Medications  Medication Dose Route Frequency Provider Last Rate Last Admin   botulinum toxin Type A (BOTOX) injection 155 Units  155 Units Intramuscular Once Sater, Richard A, MD        PHYSICAL EXAM: Vitals:   02/11/23 1048  BP: 95/70  Pulse: 86  SpO2: 95%  Weight: 240 lb 3.2 oz (109 kg)  Height: 5\' 11"  (1.803 m)   Body mass index is 33.5 kg/m.  Wt Readings from Last 3 Encounters:  02/11/23 240 lb 3.2 oz (109 kg)  02/10/23 241 lb (109.3 kg)  01/20/23 245 lb 8 oz (111.4 kg)    General: Well developed, well nourished male in no apparent distress.  HEENT: AT/[place], no external lesions.  Eyes: Conjunctiva clear and no icterus. Neck: Neck supple  Lungs: Respirations not labored Neurologic: Alert, oriented, normal speech Extremities / Skin: Dry. No sores or rashes noted.  Psychiatric: Does not appear depressed  or anxious  Diabetic Foot Exam - Simple   No data filed    LABS Reviewed Lab Results  Component Value Date   HGBA1C 7.3 (A) 02/11/2023   HGBA1C 7.7 (H) 09/01/2022   HGBA1C 7.1 (H) 05/11/2022   Lab Results  Component Value Date   FRUCTOSAMINE 305 (H) 05/16/2021   FRUCTOSAMINE 342 (H) 12/31/2020   FRUCTOSAMINE 317 (H) 06/18/2020   Lab Results  Component Value Date   CHOL 132 01/20/2023   HDL 41.00 01/20/2023   LDLCALC 68 01/30/2022   LDLDIRECT 59.0 01/20/2023   TRIG 240.0 (H) 01/20/2023   CHOLHDL 3 01/20/2023  Lab Results  Component Value Date   MICRALBCREAT 0.6 01/20/2023   MICRALBCREAT 1.5 01/30/2022   Lab Results  Component Value Date   CREATININE 1.43 11/18/2022   Lab Results  Component Value Date   GFR 51.71 (L) 11/18/2022    ASSESSMENT / PLAN  1. Uncontrolled type 2 diabetes mellitus with hyperglycemia, with long-term current use of insulin (HCC)     Diabetes Mellitus type 2, complicated by peripheral neuropathy and CKD. - Diabetic status / severity: Uncontrolled.  Lab Results  Component Value Date   HGBA1C 7.3 (A) 02/11/2023    - Hemoglobin A1c goal <7%   - Medications: Patient is having GI symptoms with concern about acute gastroenteritis, evaluated by primary care provider.  He has not been eating much and has not been using insulin pump for about 1 week.  He has also not been taking Jardiance due to concern for dehydration.  Plan: - Okay to stay off pump until start eating better.  Restart the insulin pump when he stopped eating better. - Do not take glimepiride, it was stopped in last visit. - Do not start jardiance until drinking better. -Check BMP.  Based on renal function will decide on dose of the metformin. -If GI symptoms get worse call your PCP/ GI or go to ER.   - Home glucose testing: continue CGM and check blood glucose as needed.  - Discussed/ Gave Hypoglycemia treatment plan.  # Consult : not required at this time.   #  Annual urine for microalbuminuria/ creatinine ratio, no microalbuminuria currently. Last  Lab Results  Component Value Date   MICRALBCREAT 0.6 01/20/2023    # Foot check nightly / neuropathy, continue gabapentin.  # Annual dilated diabetic eye exams.   - Diet: Make healthy diabetic food choices - Life style / activity / exercise: Discussed.  2. Blood pressure  -  BP Readings from Last 1 Encounters:  02/11/23 95/70    - Control is in target.  - No change in current plans.  3. Lipid status / Hyperlipidemia - Last  Lab Results  Component Value Date   LDLCALC 68 01/30/2022   - Continue atorvastatin 100 mg daily.  Managed by primary care provider. He is also on fenofibrate 3 times a week.  Diagnoses and all orders for this visit:  Uncontrolled type 2 diabetes mellitus with hyperglycemia, with long-term current use of insulin (HCC) -     POCT glycosylated hemoglobin (Hb A1C) -     Basic Metabolic Panel (BMET)   Addendum: Labs reviewed he has elevated creatinine with low EGFR.  Hold metformin for now until renal function is back to baseline.  I forwarded results to his primary care provider Dr. Drue Novel for evaluation, monitoring and management of renal insufficiency.   Latest Reference Range & Units 02/11/23 11:22  Sodium 135 - 145 mEq/L 136  Potassium 3.5 - 5.1 mEq/L 4.5  Chloride 96 - 112 mEq/L 102  CO2 19 - 32 mEq/L 21  Glucose 70 - 99 mg/dL 010 (H)  BUN 6 - 23 mg/dL 26 (H)  Creatinine 2.72 - 1.50 mg/dL 5.36 (H)  Calcium 8.4 - 10.5 mg/dL 9.9  GFR >64.40 mL/min 29.34 (L)  (H): Data is abnormally high (L): Data is abnormally low  Addendum: I called and talked to the patient.  Reports no symptoms about the same as office visit yesterday and not getting better.  I have asked not to take metformin due to renal insufficiency.  Also asked patient to  go to ER for IV fluids due to renal insufficiency /AKI on CKD and further evaluation.  Patient agreed and he will go to ER today  at [facility].  DISPOSITION Follow up in clinic in 6 weeks suggested.   All questions answered and patient verbalized understanding of the plan.  [place] Shiri Hodapp, MD [facility] Endocrinology [facility] [address] [place], [address] [place], [place] [address] Phone # [phone]  At least part of this note was generated using voice recognition software. Inadvertent word errors may have occurred, which were not recognized during the proofreading process.

## 2023-02-11 NOTE — Patient Instructions (Signed)
Okay to stay off pump until start eating better. Do not take glimepiride, it was stopped in last visit.  Do not start jardiance until drinking better.  Will check lab today and will let you know about Metformin regarding dose.   If you symptoms get worse call your PCP/ GI or go to ER.

## 2023-02-12 ENCOUNTER — Encounter (HOSPITAL_BASED_OUTPATIENT_CLINIC_OR_DEPARTMENT_OTHER): Payer: Self-pay

## 2023-02-12 ENCOUNTER — Emergency Department (HOSPITAL_BASED_OUTPATIENT_CLINIC_OR_DEPARTMENT_OTHER)
Admission: EM | Admit: 2023-02-12 | Discharge: 2023-02-12 | Disposition: A | Discharge status: Home / Self Care | Payer: 59 | Source: Home / Self Care | Attending: Emergency Medicine | Admitting: Emergency Medicine

## 2023-02-12 ENCOUNTER — Emergency Department (HOSPITAL_BASED_OUTPATIENT_CLINIC_OR_DEPARTMENT_OTHER): Payer: 59

## 2023-02-12 ENCOUNTER — Other Ambulatory Visit: Payer: Self-pay

## 2023-02-12 DIAGNOSIS — I2510 Atherosclerotic heart disease of native coronary artery without angina pectoris: Secondary | ICD-10-CM | POA: Diagnosis not present

## 2023-02-12 DIAGNOSIS — Z7901 Long term (current) use of anticoagulants: Secondary | ICD-10-CM | POA: Insufficient documentation

## 2023-02-12 DIAGNOSIS — R1032 Left lower quadrant pain: Secondary | ICD-10-CM | POA: Diagnosis not present

## 2023-02-12 DIAGNOSIS — R197 Diarrhea, unspecified: Secondary | ICD-10-CM | POA: Diagnosis not present

## 2023-02-12 DIAGNOSIS — Z7984 Long term (current) use of oral hypoglycemic drugs: Secondary | ICD-10-CM | POA: Diagnosis not present

## 2023-02-12 DIAGNOSIS — N179 Acute kidney failure, unspecified: Secondary | ICD-10-CM | POA: Diagnosis not present

## 2023-02-12 DIAGNOSIS — N189 Chronic kidney disease, unspecified: Secondary | ICD-10-CM | POA: Insufficient documentation

## 2023-02-12 DIAGNOSIS — Z79899 Other long term (current) drug therapy: Secondary | ICD-10-CM | POA: Insufficient documentation

## 2023-02-12 DIAGNOSIS — E1122 Type 2 diabetes mellitus with diabetic chronic kidney disease: Secondary | ICD-10-CM | POA: Diagnosis not present

## 2023-02-12 DIAGNOSIS — D631 Anemia in chronic kidney disease: Secondary | ICD-10-CM | POA: Diagnosis not present

## 2023-02-12 DIAGNOSIS — K7460 Unspecified cirrhosis of liver: Secondary | ICD-10-CM | POA: Diagnosis not present

## 2023-02-12 DIAGNOSIS — I129 Hypertensive chronic kidney disease with stage 1 through stage 4 chronic kidney disease, or unspecified chronic kidney disease: Secondary | ICD-10-CM | POA: Insufficient documentation

## 2023-02-12 DIAGNOSIS — Z7982 Long term (current) use of aspirin: Secondary | ICD-10-CM | POA: Insufficient documentation

## 2023-02-12 DIAGNOSIS — K5730 Diverticulosis of large intestine without perforation or abscess without bleeding: Secondary | ICD-10-CM | POA: Diagnosis not present

## 2023-02-12 DIAGNOSIS — I10 Essential (primary) hypertension: Secondary | ICD-10-CM | POA: Diagnosis not present

## 2023-02-12 DIAGNOSIS — Z794 Long term (current) use of insulin: Secondary | ICD-10-CM | POA: Diagnosis not present

## 2023-02-12 DIAGNOSIS — N200 Calculus of kidney: Secondary | ICD-10-CM | POA: Diagnosis not present

## 2023-02-12 DIAGNOSIS — E860 Dehydration: Secondary | ICD-10-CM | POA: Diagnosis not present

## 2023-02-12 LAB — COMPREHENSIVE METABOLIC PANEL
ALT: 29 U/L (ref 0–44)
AST: 32 U/L (ref 15–41)
Albumin: 4.6 g/dL (ref 3.5–5.0)
Alkaline Phosphatase: 76 U/L (ref 38–126)
Anion gap: 11 (ref 5–15)
BUN: 25 mg/dL — ABNORMAL HIGH (ref 8–23)
CO2: 23 mmol/L (ref 22–32)
Calcium: 9.5 mg/dL (ref 8.9–10.3)
Chloride: 104 mmol/L (ref 98–111)
Creatinine, Ser: 1.79 mg/dL — ABNORMAL HIGH (ref 0.61–1.24)
GFR, Estimated: 42 mL/min — ABNORMAL LOW (ref >60)
Glucose, Bld: 136 mg/dL — ABNORMAL HIGH (ref 70–99)
Potassium: 4.3 mmol/L (ref 3.5–5.1)
Sodium: 138 mmol/L (ref 135–145)
Total Bilirubin: 1.6 mg/dL — ABNORMAL HIGH (ref 0.3–1.2)
Total Protein: 8.4 g/dL — ABNORMAL HIGH (ref 6.5–8.1)

## 2023-02-12 LAB — CBC
HCT: 45.5 % (ref 39.0–52.0)
Hemoglobin: 15 g/dL (ref 13.0–17.0)
MCH: 28.5 pg (ref 26.0–34.0)
MCHC: 33 g/dL (ref 30.0–36.0)
MCV: 86.3 fL (ref 80.0–100.0)
Platelets: 196 10*3/uL (ref 150–400)
RBC: 5.27 MIL/uL (ref 4.22–5.81)
RDW: 15.4 % (ref 11.5–15.5)
WBC: 6.7 10*3/uL (ref 4.0–10.5)
nRBC: 0 % (ref 0.0–0.2)

## 2023-02-12 LAB — MAGNESIUM: Magnesium: 1.6 mg/dL — ABNORMAL LOW (ref 1.7–2.4)

## 2023-02-12 LAB — URINALYSIS, ROUTINE W REFLEX MICROSCOPIC
Bilirubin Urine: NEGATIVE
Glucose, UA: >=500 mg/dL — AB
Hgb urine dipstick: NEGATIVE
Ketones, ur: NEGATIVE mg/dL
Leukocytes,Ua: NEGATIVE
Nitrite: NEGATIVE
Protein, ur: NEGATIVE mg/dL
Specific Gravity, Urine: >=1.03 (ref 1.005–1.030)
pH: 5 (ref 5.0–8.0)

## 2023-02-12 LAB — URINALYSIS, MICROSCOPIC (REFLEX)

## 2023-02-12 LAB — LIPASE, BLOOD: Lipase: 33 U/L (ref 11–51)

## 2023-02-12 MED ORDER — ONDANSETRON HCL 4 MG/2ML IJ SOLN
4.0000 mg | Freq: Once | INTRAMUSCULAR | Status: AC
  Administered 2023-02-12: 4 mg via INTRAVENOUS
  Filled 2023-02-12: qty 2

## 2023-02-12 MED ORDER — SODIUM CHLORIDE 0.9 % IV BOLUS
1000.0000 mL | Freq: Once | INTRAVENOUS | Status: AC
  Administered 2023-02-12: 1000 mL via INTRAVENOUS

## 2023-02-12 MED ORDER — DICYCLOMINE HCL 20 MG PO TABS: 20.0000 mg | ORAL_TABLET | Freq: Two times a day (BID) | ORAL | 0 refills | Status: DC | PRN

## 2023-02-12 MED ORDER — SODIUM CHLORIDE 0.9 % IV BOLUS
500.0000 mL | Freq: Once | INTRAVENOUS | Status: AC
  Administered 2023-02-12: 500 mL via INTRAVENOUS

## 2023-02-12 MED ORDER — IOHEXOL 300 MG/ML  SOLN
100.0000 mL | Freq: Once | INTRAMUSCULAR | Status: AC | PRN
  Administered 2023-02-12: 100 mL via INTRAVENOUS

## 2023-02-12 MED ORDER — ONDANSETRON HCL 4 MG PO TABS: 4.0000 mg | ORAL_TABLET | Freq: Three times a day (TID) | ORAL | 0 refills | Status: AC | PRN

## 2023-02-12 NOTE — Discharge Instructions (Signed)
Thank you for letting us take care of you today.  Overall, your workup was reassuring. Your kidney function has improved since yesterday.  Your urine was not infected.  There were no other significant abnormalities in your blood work.  The CT scan did not show a diverticulitis flare or any other abdominal complications today.  I suspect that you likely have a viral syndrome causing your abdominal pain and diarrhea.  You can stop the antibiotics.    With the change in your kidney function though it is improving, it is very important to stay well-hydrated using fluids or electrolyte replacement solutions such as diet Gatorade or Powerade considering your diabetes.  It is most important to stay well-hydrated we can also start reintroducing bland foods into your diet slowly as you regain an appetite.    I am prescribing Zofran which can help with nausea and can also help slow down diarrhea.  You may also take Imodium as recommended by your primary care doctor.  I am also prescribing Bentyl which can help with abdominal pain in addition to over-the-counter medications as needed.  Be rechecked by your PCP next week or sooner for any persistent, severe symptoms.  If you develop worsening symptoms or condition, return to the nearest ED for reevaluation.

## 2023-02-12 NOTE — ED Triage Notes (Signed)
The patient stated has been having abd pain with diarrhea since last Friday. He had routine blood work done and his electrolytes were low according to the patient. He was advised to come and get fluids. Hx of diverticulitis.

## 2023-02-12 NOTE — ED Provider Notes (Signed)
[facility] Provider Note   CSN: [id number] Arrival date & time: 02/12/23  1232     History {Add pertinent medical, surgical, social history, OB history to HPI:1} Chief Complaint  Patient presents with   Abdominal Pain    James [name] is a 67 y.o. male with PMH HTN, HLD, CKD with baseline creatinine ~1.5, iron deficiency anemia, CAD, GERD, DM who presents to ED c/o LLQ abdominal pain with nausea, subjective fever and chills, and decreased PO intake for the last week. He has a history of diverticulitis with similar presentation.  Last flareup of diverticulitis was 2 months ago.  He did have some leftover Cipro and Flagyl from that episode and began taking it last night.  Following previous episode 2 months ago, he had a follow-up EGD and colonoscopy that he reports was unremarkable.  Last bowel movement was last night.  He is having occasional diarrhea.  No hematochezia or melena. No chest pain or shortness of breath. No urinary symptoms. Last EF 60-65%.     Home Medications Prior to Admission medications   Medication Sig Start Date End Date Taking? Authorizing Provider  dicyclomine (BENTYL) 20 MG tablet Take 1 tablet (20 mg total) by mouth 2 (two) times daily as needed for up to 5 days for spasms. 02/12/23 02/17/23 Yes Dominique Ressel L, PA-C  ondansetron (ZOFRAN) 4 MG tablet Take 1 tablet (4 mg total) by mouth every 8 (eight) hours as needed for up to 5 days for nausea or vomiting. 02/12/23 02/17/23 Yes Xzayvion Vaeth L, PA-C  acetaminophen (TYLENOL) 325 MG tablet Take 2 tablets (650 mg total) by mouth every 4 (four) hours as needed for headache or mild pain. Patient not taking: Reported on 02/11/2023 08/18/21   Leone Brand, NP  adalimumab (HUMIRA, 2 PEN,) 40 MG/0.8ML PNKT pen Inject 40 mg (0.8 ml) under the skin every other week 01/27/23   Quentin Angst, MD  amLODipine (NORVASC) 5 MG tablet Take 1 tablet (5 mg total) by mouth daily.  12/29/22   Georgeanna Lea, MD  aspirin 81 MG chewable tablet Chew 1 tablet (81 mg total) by mouth daily. 08/19/21   Leone Brand, NP  atorvastatin (LIPITOR) 80 MG tablet Take 1 tablet (80 mg total) by mouth at bedtime. 09/30/22   Wanda Plump, MD  botulinum toxin Type A (BOTOX) 200 units injection Provider to inject 155 units into the muscles of the head and neck every 3 months. Discard remainder. 10/12/22   Sater, Pearletha Furl, MD  carvedilol (COREG) 12.5 MG tablet Take 1 tablet (12.5 mg total) by mouth 2 (two) times daily with a meal. 10/30/22   Paz, Nolon Rod, MD  clobetasol (TEMOVATE) 0.05 % external solution Apply 1 application externally twice a day 14 days. 10/01/22     clonazePAM (KLONOPIN) 0.5 MG tablet Take 1 tablet (0.5 mg total) by mouth at bedtime. 10/12/22 04/10/23  Sater, Pearletha Furl, MD  clopidogrel (PLAVIX) 75 MG tablet Take 1 tablet (75 mg total) by mouth daily. 12/29/22 12/29/23  Georgeanna Lea, MD  Continuous Glucose Sensor (DEXCOM G6 SENSOR) MISC Use to monitor blood sugar, change after 10 days Patient taking differently: 1 each by Other route See admin instructions. Use to monitor blood sugar, change after 10 days 10/07/22   Reather Littler, MD  DULoxetine (CYMBALTA) 60 MG capsule Take 2 capsules (120 mg total) by mouth daily. 09/30/22   Wanda Plump, MD  empagliflozin (JARDIANCE) 10  MG TABS tablet Take 1 tablet (10 mg total) by mouth daily with breakfast. Patient not taking: Reported on 02/11/2023 12/02/22   Reather Littler, MD  EPINEPHrine (EPIPEN 2-PAK) 0.3 mg/0.3 mL IJ SOAJ injection Inject 0.3 mLs (0.3 mg total) into the muscle once as needed for up to 1 dose for anaphylaxis. 01/10/20   Wanda Plump, MD  fenofibrate micronized (LOFIBRA) 134 MG capsule Take 1 capsule (134 mg total) by mouth 3 (three) times a week. 12/02/22 12/02/23  Reather Littler, MD  gabapentin (NEURONTIN) 600 MG tablet Take 1 tablet (600 mg total) by mouth 2 (two) times daily. 09/30/22   Wanda Plump, MD  glimepiride (AMARYL) 4 MG tablet Take  1 tablet (4 mg total) by mouth daily before breakfast. 10/30/22   Reather Littler, MD  Insulin Disposable Pump (OMNIPOD 5 G6 PODS, GEN 5,) MISC Use as directed every 3 (three) days. 09/09/22   Reather Littler, MD  insulin lispro (HUMALOG) 100 UNIT/ML injection Use up to 60 Units daily in pump Patient taking differently: Inject 60 Units into the skin See admin instructions. Use up to 60 Units daily in pump 10/07/22   Reather Littler, MD  Insulin Syringe-Needle U-100 (INSULIN SYRINGE 1CC/31GX5/16") 31G X 5/16" 1 ML MISC Use to administer Humalog 3 times a day Patient taking differently: 1 tablet by Other route See admin instructions. Use to administer Humalog 3 times a day 10/07/22   Reather Littler, MD  isosorbide mononitrate (IMDUR) 120 MG 24 hr tablet Take 1 tablet (120 mg total) by mouth daily. 07/14/22   Georgeanna Lea, MD  liraglutide (VICTOZA) 18 MG/3ML SOPN Start with 0.6mg  subcutaneously once a day for 7 days, then increase to 1.2mg  once a day 06/18/22   Reather Littler, MD  metFORMIN (GLUCOPHAGE) 500 MG tablet Take 2 tablets (1,000 mg total) by mouth 2 (two) times daily. 02/05/23   Reather Littler, MD  nitroGLYCERIN (NITROSTAT) 0.4 MG SL tablet Place 1 tablet (0.4 mg total) under the tongue every 5 (five) minutes for 3 doses as needed for chest pain. 06/26/21   Wanda Plump, MD  ondansetron (ZOFRAN) 4 MG tablet Take 1 tablet (4 mg total) by mouth every 6 (six) hours as needed for nausea or vomiting. 11/12/22   Glyn Ade, MD  pantoprazole (PROTONIX) 40 MG tablet Take 1 tablet (40 mg total) by mouth daily. 09/21/22   Wanda Plump, MD  traMADol (ULTRAM) 50 MG tablet Take 1 tablet (50 mg total) by mouth every 6 (six) hours as needed for up to 10 doses for severe pain. Patient not taking: Reported on 01/19/2023 10/18/22   Mardene Sayer, MD      Allergies    Bee venom, Hydrocodone bit-homatrop mbr, Toradol [ketorolac tromethamine], Prednisone, Sulfadiazine, Morphine and codeine, and Sulfa drugs cross reactors     Review of Systems   Review of Systems  All other systems reviewed and are negative.   Physical Exam Updated Vital Signs BP 128/87   Pulse 69   Temp 97.9 F (36.6 C) (Oral)   Resp 18   Ht 5\' 11"  (1.803 m)   Wt 109 kg   SpO2 92%   BMI 33.52 kg/m  Physical Exam Vitals and nursing note reviewed.  Constitutional:      General: He is not in acute distress.    Appearance: Normal appearance. He is not toxic-appearing.  HENT:     Head: Normocephalic and atraumatic.     Mouth/Throat:     Comments: Moderately  dry mucous membranes Eyes:     Conjunctiva/sclera: Conjunctivae normal.  Cardiovascular:     Rate and Rhythm: Normal rate and regular rhythm.     Heart sounds: No murmur heard. Pulmonary:     Effort: Pulmonary effort is normal.     Breath sounds: Normal breath sounds.  Abdominal:     General: Abdomen is flat.     Palpations: Abdomen is soft.     Tenderness: There is abdominal tenderness in the left lower quadrant. There is no right CVA tenderness, left CVA tenderness, guarding or rebound. Negative signs include Rovsing's sign and McBurney's sign.  Musculoskeletal:        General: Normal range of motion.     Cervical back: Neck supple.     Right lower leg: No edema.     Left lower leg: No edema.  Skin:    General: Skin is warm and dry.     Capillary Refill: Capillary refill takes less than 2 seconds.  Neurological:     Mental Status: He is alert. Mental status is at baseline.  Psychiatric:        Mood and Affect: Mood normal.        Behavior: Behavior normal.     ED Results / Procedures / Treatments   Labs (all labs ordered are listed, but only abnormal results are displayed) Labs Reviewed  COMPREHENSIVE METABOLIC PANEL - Abnormal; Notable for the following components:      Result Value   Glucose, Bld 136 (*)    BUN 25 (*)    Creatinine, Ser 1.79 (*)    Total Protein 8.4 (*)    Total Bilirubin 1.6 (*)    GFR, Estimated 42 (*)    All other components  within normal limits  URINALYSIS, ROUTINE W REFLEX MICROSCOPIC - Abnormal; Notable for the following components:   Glucose, UA >=500 (*)    All other components within normal limits  MAGNESIUM - Abnormal; Notable for the following components:   Magnesium 1.6 (*)    All other components within normal limits  URINALYSIS, MICROSCOPIC (REFLEX) - Abnormal; Notable for the following components:   Bacteria, UA RARE (*)    All other components within normal limits  LIPASE, BLOOD  CBC    EKG None  Radiology CT ABDOMEN PELVIS W CONTRAST  Result Date: 02/12/2023 CLINICAL DATA:  Left lower quadrant abdominal pain EXAM: CT ABDOMEN AND PELVIS WITH CONTRAST TECHNIQUE: Multidetector CT imaging of the abdomen and pelvis was performed using the standard protocol following bolus administration of intravenous contrast. RADIATION DOSE REDUCTION: This exam was performed according to the departmental dose-optimization program which includes automated exposure control, adjustment of the mA and/or kV according to patient size and/or use of iterative reconstruction technique. CONTRAST:  OMNIPAQUE IOHEXOL 300 MG/ML  SOLN COMPARISON:  11/12/2022 CT abdomen/pelvis FINDINGS: Lower chest: No significant pulmonary nodules or acute consolidative airspace disease. Coronary atherosclerosis. Hepatobiliary: Diffusely irregular liver surface with relative hypertrophy of the lateral segment left liver lobe, compatible with cirrhosis. No liver masses. Cholecystectomy. No biliary ductal dilatation. Pancreas: Normal, with no mass or duct dilation. Spleen: Normal size. No mass. Adrenals/Urinary Tract: Normal adrenals. Nonobstructing 3 mm interpolar right renal stone. No hydronephrosis. Subcentimeter hypodense posterior upper left renal cortical lesion, too small to characterize, for which no follow-up imaging is recommended. No suspicious renal masses. Chronic mildly asymmetrically small left kidney. Normal bladder. Stomach/Bowel:  Normal non-distended stomach. Normal caliber small bowel with no small bowel wall thickening. Normal appendix. Mild  sigmoid diverticulosis with no acute large bowel wall thickening or significant pericolonic fat stranding. Vascular/Lymphatic: Atherosclerotic nonaneurysmal abdominal aorta. Patent portal, splenic, hepatic and renal veins. No pathologically enlarged lymph nodes in the abdomen or pelvis. Reproductive: Normal size prostate. Other: No pneumoperitoneum, ascites or focal fluid collection. Ventral left chest wall subcutaneous shunt catheter terminates in ventral left lower peritoneal cavity with no evidence of kink or discontinuity. Musculoskeletal: No aggressive appearing focal osseous lesions. Left total hip arthroplasty. Bilateral posterior spinal fusion L4-S1. Moderate thoracolumbar spondylosis. IMPRESSION: 1. No acute abnormality. No evidence of bowel obstruction or acute bowel inflammation. Mild sigmoid diverticulosis, with no evidence of acute diverticulitis. 2. Cirrhosis. No liver masses. Normal size spleen.  No ascites. 3. Nonobstructing right nephrolithiasis. 4. Coronary atherosclerosis. 5.  Aortic Atherosclerosis (ICD10-I70.0). Electronically Signed   By: Delbert Phenix M.D.   On: 02/12/2023 16:52    Procedures Procedures  {Document cardiac monitor, telemetry assessment procedure when appropriate:1}  Medications Ordered in ED Medications  sodium chloride 0.9 % bolus 1,000 mL (0 mLs Intravenous Stopped 02/12/23 1529)  ondansetron (ZOFRAN) injection 4 mg (4 mg Intravenous Given 02/12/23 1421)  iohexol (OMNIPAQUE) 300 MG/ML solution 100 mL (100 mLs Intravenous Contrast Given 02/12/23 1545)  sodium chloride 0.9 % bolus 500 mL (0 mLs Intravenous Stopped 02/12/23 1700)    ED Course/ Medical Decision Making/ A&P Clinical Course as of 02/12/23 2038  Fri Feb 12, 2023  1446 Creatinine elevated at 2.29 from patient's labs yesterday.  He has a baseline around 1.5.  Creatinine is improved at today's  visit.  Receiving IV fluids.  CT scan pending. [MG]    Clinical Course User Index [MG] Tonette Lederer, PA-C   {   Click here for ABCD2, HEART and other calculatorsREFRESH Note before signing :1}                              Medical Decision Making Amount and/or Complexity of Data Reviewed Labs: ordered. Decision-making details documented in ED Course. Radiology: ordered. Decision-making details documented in ED Course. ECG/medicine tests: ordered. Decision-making details documented in ED Course.  Risk Prescription drug management.   Medical Decision Making:   James [name] is a 67 y.o. male who presented to the ED today with abdominal pain detailed above.    Patient's presentation is complicated by their history of diverticulitis, hypertension, hyperlipidemia, previous cholecystectomy, diabetes, decreased p.o. intake, CKD.  Complete initial physical exam performed, notably the patient  was in no acute distress, nontoxic-appearing.  He had moderate left lower quadrant abdominal tenderness but abdomen was soft and without rebound or guarding.  No CVA tenderness.  Regular rate and rhythm, lungs clear to auscultation, no respiratory distress.  No lower extremity edema.  Neurologically intact.    Reviewed and confirmed nursing documentation for past medical history, family history, social history.    Initial Assessment:   With the patient's presentation of abdominal pain, differential diagnosis includes but is not limited to AAA, mesenteric ischemia, appendicitis, diverticulitis, DKA, gastritis, gastroenteritis, AMI, nephrolithiasis, pancreatitis, peritonitis, adrenal insufficiency, intestinal ischemia, constipation, UTI, SBO/LBO, splenic rupture, biliary disease, IBD, IBS, PUD, hepatitis, STD, ovarian/testicular torsion, electrolyte disturbance, DKA, dehydration, acute kidney injury, renal failure, cholecystitis, cholelithiasis, choledocholithiasis, abdominal pain of  unknown etiology.    Initial Plan:  Screening labs including CBC and Metabolic panel to evaluate for infectious or metabolic etiology of disease.  Lipase to evaluate for pancreatitis Urinalysis with reflex culture ordered to evaluate  for UTI or relevant urologic/nephrologic pathology.  CT abd/pelvis to evaluate for intra-abdominal pathology EKG to evaluate for cardiac pathology Symptomatic management -declines pain management on initial exam Objective evaluation as reviewed   Initial Study Results:   Laboratory  All laboratory results reviewed without evidence of clinically relevant pathology.   Exceptions include: Creatinine 1.79 improved from yesterday at 2.29  EKG EKG was reviewed independently. ST segments without concerns for elevations.   EKG: normal sinus rhythm. No acute ST-T changes. No STEMI.   Radiology:  All images reviewed independently. Agree with radiology report at this time.   CT ABDOMEN PELVIS W CONTRAST  Result Date: 02/12/2023 CLINICAL DATA:  Left lower quadrant abdominal pain EXAM: CT ABDOMEN AND PELVIS WITH CONTRAST TECHNIQUE: Multidetector CT imaging of the abdomen and pelvis was performed using the standard protocol following bolus administration of intravenous contrast. RADIATION DOSE REDUCTION: This exam was performed according to the departmental dose-optimization program which includes automated exposure control, adjustment of the mA and/or kV according to patient size and/or use of iterative reconstruction technique. CONTRAST:  OMNIPAQUE IOHEXOL 300 MG/ML  SOLN COMPARISON:  11/12/2022 CT abdomen/pelvis FINDINGS: Lower chest: No significant pulmonary nodules or acute consolidative airspace disease. Coronary atherosclerosis. Hepatobiliary: Diffusely irregular liver surface with relative hypertrophy of the lateral segment left liver lobe, compatible with cirrhosis. No liver masses. Cholecystectomy. No biliary ductal dilatation. Pancreas: Normal, with no mass or duct dilation.  Spleen: Normal size. No mass. Adrenals/Urinary Tract: Normal adrenals. Nonobstructing 3 mm interpolar right renal stone. No hydronephrosis. Subcentimeter hypodense posterior upper left renal cortical lesion, too small to characterize, for which no follow-up imaging is recommended. No suspicious renal masses. Chronic mildly asymmetrically small left kidney. Normal bladder. Stomach/Bowel: Normal non-distended stomach. Normal caliber small bowel with no small bowel wall thickening. Normal appendix. Mild sigmoid diverticulosis with no acute large bowel wall thickening or significant pericolonic fat stranding. Vascular/Lymphatic: Atherosclerotic nonaneurysmal abdominal aorta. Patent portal, splenic, hepatic and renal veins. No pathologically enlarged lymph nodes in the abdomen or pelvis. Reproductive: Normal size prostate. Other: No pneumoperitoneum, ascites or focal fluid collection. Ventral left chest wall subcutaneous shunt catheter terminates in ventral left lower peritoneal cavity with no evidence of kink or discontinuity. Musculoskeletal: No aggressive appearing focal osseous lesions. Left total hip arthroplasty. Bilateral posterior spinal fusion L4-S1. Moderate thoracolumbar spondylosis. IMPRESSION: 1. No acute abnormality. No evidence of bowel obstruction or acute bowel inflammation. Mild sigmoid diverticulosis, with no evidence of acute diverticulitis. 2. Cirrhosis. No liver masses. Normal size spleen.  No ascites. 3. Nonobstructing right nephrolithiasis. 4. Coronary atherosclerosis. 5.  Aortic Atherosclerosis (ICD10-I70.0). Electronically Signed   By: Delbert Phenix M.D.   On: 02/12/2023 16:52      Final Assessment and Plan:   67 year old male presents to the ED with left lower quadrant abdominal pain and diarrhea.  History of 2 previous episodes of diverticulitis.  Normal colonoscopy within the last 2 months.  No fevers, nausea, or vomiting.  Is having issues with decreased appetite.  Lab work performed by  PCP this week revealed an AKI and patient sent to the ED for further evaluation.  Labs today show that creatinine is improved from yesterday.  Given 1-1/2 boluses of IV fluids.  Last EF normal.  Does not appear volume overloaded.  Does have some tenderness to the left lower quadrant.  Discussed risk/benefits of obtaining imaging to rule out diverticulitis or other complication and patient agreeable.  CT scan does not show evidence of diverticulitis, abscess, infectious process or  other etiology of patient's symptoms.  He has only been on antibiotics since last night so do not suspect that symptoms were related to an acute diverticulitis.   Clinical Impression:  1. Left lower quadrant abdominal pain   2. Acute kidney injury (HCC)   3. Diarrhea, unspecified type   4. Dehydration      Discharge    {Document critical care time when appropriate:1} {Document review of labs and clinical decision tools ie heart score, Chads2Vasc2 etc:1}  {Document your independent review of radiology images, and any outside records:1} {Document your discussion with family members, caretakers, and with consultants:1} {Document social determinants of health affecting pt's care:1} {Document your decision making why or why not admission, treatments were needed:1} Final Clinical Impression(s) / ED Diagnoses Final diagnoses:  Acute kidney injury (HCC)  Left lower quadrant abdominal pain  Diarrhea, unspecified type  Dehydration    Rx / DC Orders ED Discharge Orders          Ordered    dicyclomine (BENTYL) 20 MG tablet  2 times daily PRN        02/12/23 1802    ondansetron (ZOFRAN) 4 MG tablet  Every 8 hours PRN        02/12/23 1802

## 2023-02-18 ENCOUNTER — Other Ambulatory Visit (HOSPITAL_BASED_OUTPATIENT_CLINIC_OR_DEPARTMENT_OTHER): Payer: Self-pay

## 2023-02-18 MED ORDER — ONDANSETRON HCL 4 MG PO TABS
4.0000 mg | ORAL_TABLET | Freq: Three times a day (TID) | ORAL | 0 refills | Status: DC | PRN
  Filled 2023-02-18: qty 12, 4d supply, fill #0

## 2023-02-18 MED ORDER — DICYCLOMINE HCL 20 MG PO TABS
20.0000 mg | ORAL_TABLET | Freq: Two times a day (BID) | ORAL | 0 refills | Status: AC | PRN
  Filled 2023-02-18: qty 10, 5d supply, fill #0

## 2023-02-19 ENCOUNTER — Other Ambulatory Visit (HOSPITAL_COMMUNITY): Payer: Self-pay

## 2023-02-19 ENCOUNTER — Encounter: Payer: Self-pay | Admitting: Internal Medicine

## 2023-02-24 ENCOUNTER — Other Ambulatory Visit (HOSPITAL_COMMUNITY): Payer: Self-pay

## 2023-02-26 ENCOUNTER — Other Ambulatory Visit (HOSPITAL_BASED_OUTPATIENT_CLINIC_OR_DEPARTMENT_OTHER): Payer: Self-pay

## 2023-02-26 ENCOUNTER — Other Ambulatory Visit: Payer: Self-pay

## 2023-03-01 ENCOUNTER — Other Ambulatory Visit: Payer: Self-pay | Admitting: Endocrinology

## 2023-03-01 DIAGNOSIS — E1165 Type 2 diabetes mellitus with hyperglycemia: Secondary | ICD-10-CM

## 2023-03-02 ENCOUNTER — Other Ambulatory Visit: Payer: Self-pay

## 2023-03-02 ENCOUNTER — Other Ambulatory Visit (HOSPITAL_BASED_OUTPATIENT_CLINIC_OR_DEPARTMENT_OTHER): Payer: Self-pay

## 2023-03-11 NOTE — Telephone Encounter (Signed)
CMM approval: PA Case ID #: [id number] (03/10/23-09/06/23)

## 2023-03-12 ENCOUNTER — Other Ambulatory Visit (HOSPITAL_COMMUNITY): Payer: Self-pay

## 2023-03-15 ENCOUNTER — Other Ambulatory Visit: Payer: Self-pay | Admitting: Internal Medicine

## 2023-03-15 ENCOUNTER — Other Ambulatory Visit: Payer: Self-pay | Admitting: Endocrinology

## 2023-03-15 DIAGNOSIS — E1165 Type 2 diabetes mellitus with hyperglycemia: Secondary | ICD-10-CM

## 2023-03-15 DIAGNOSIS — M5416 Radiculopathy, lumbar region: Secondary | ICD-10-CM | POA: Diagnosis not present

## 2023-03-16 ENCOUNTER — Other Ambulatory Visit (HOSPITAL_COMMUNITY): Payer: Self-pay

## 2023-03-16 ENCOUNTER — Other Ambulatory Visit: Payer: Self-pay

## 2023-03-16 ENCOUNTER — Other Ambulatory Visit (HOSPITAL_BASED_OUTPATIENT_CLINIC_OR_DEPARTMENT_OTHER): Payer: Self-pay

## 2023-03-16 MED ORDER — METFORMIN HCL 500 MG PO TABS
1000.0000 mg | ORAL_TABLET | Freq: Two times a day (BID) | ORAL | 0 refills | Status: DC
Start: 2023-03-16 — End: 2023-04-11
  Filled 2023-03-16: qty 120, 30d supply, fill #0

## 2023-03-16 MED ORDER — PANTOPRAZOLE SODIUM 40 MG PO TBEC
40.0000 mg | DELAYED_RELEASE_TABLET | Freq: Every day | ORAL | 1 refills | Status: DC
  Filled 2023-03-16: qty 90, 90d supply, fill #0
  Filled 2023-06-22: qty 90, 90d supply, fill #1

## 2023-03-18 ENCOUNTER — Telehealth: Payer: Self-pay

## 2023-03-18 NOTE — Telephone Encounter (Signed)
   [facility] Pre-operative Risk Assessment    Request for surgical clearance:  What type of surgery is being performed? LESI L2-3   When is this surgery scheduled? TBD   What type of clearance is required (medical clearance vs. Pharmacy clearance to hold med vs. Both)? Pharmacy  Are there any medications that need to be held prior to surgery and how long?Plavix- hold 7 days prior resume the next day   Practice name and name of physician performing surgery? Dr. Aileen Fass at [facility]   What is your office phone number: [phone]    7.   What is your office fax number: [phone]  8.   Anesthesia type (None, local, MAC, general) ? Not specified   James [name] 03/18/2023, 5:53 PM  _________________________________________________________________   (provider comments below)

## 2023-03-19 NOTE — Telephone Encounter (Signed)
Name: James [name]  DOB: 25-Mar-1956  MRN: [id number]   Primary Cardiologist: Gypsy Balsam, MD  Chart reviewed as part of pre-operative protocol coverage.  Per office protocol, he may hold Plavix for 5 days and Aspirin for 7 days prior to procedure and should resume as soon as hemodynamically stable postoperatively.   I will route this recommendation to the requesting party via Epic fax function and remove from pre-op pool. Please call with questions.  Carlos Levering, NP 03/19/2023, 4:41 PM

## 2023-03-20 ENCOUNTER — Encounter (HOSPITAL_COMMUNITY): Payer: Self-pay

## 2023-03-20 ENCOUNTER — Other Ambulatory Visit: Payer: Self-pay

## 2023-03-22 ENCOUNTER — Telehealth: Payer: Self-pay | Admitting: Cardiology

## 2023-03-22 NOTE — Telephone Encounter (Signed)
Porsha from [facility] is calling because we gave clearance for the patient to hold their Plavix for 5 days. Judithann Sauger stated they need the clearance to say the patient can hold the Plavix for 7 days in order to proceed with the procedure. Please advise.

## 2023-03-24 ENCOUNTER — Other Ambulatory Visit: Payer: Self-pay | Admitting: Internal Medicine

## 2023-03-24 ENCOUNTER — Other Ambulatory Visit (HOSPITAL_BASED_OUTPATIENT_CLINIC_OR_DEPARTMENT_OTHER): Payer: Self-pay

## 2023-03-24 ENCOUNTER — Other Ambulatory Visit: Payer: Self-pay

## 2023-03-24 ENCOUNTER — Other Ambulatory Visit: Payer: Self-pay | Admitting: Endocrinology

## 2023-03-24 MED ORDER — ATORVASTATIN CALCIUM 80 MG PO TABS
80.0000 mg | ORAL_TABLET | Freq: Every day | ORAL | 1 refills | Status: DC
  Filled 2023-03-24: qty 90, 90d supply, fill #0
  Filled 2023-06-22: qty 90, 90d supply, fill #1

## 2023-03-24 MED ORDER — DULOXETINE HCL 60 MG PO CPEP
120.0000 mg | ORAL_CAPSULE | Freq: Every day | ORAL | 1 refills | Status: DC
  Filled 2023-03-24: qty 180, 90d supply, fill #0
  Filled 2023-06-22: qty 180, 90d supply, fill #1

## 2023-03-24 MED ORDER — EMPAGLIFLOZIN 10 MG PO TABS
10.0000 mg | ORAL_TABLET | Freq: Every day | ORAL | 3 refills | Status: DC
  Filled 2023-03-24: qty 30, 30d supply, fill #0
  Filled 2023-04-23: qty 30, 30d supply, fill #1
  Filled 2023-05-30: qty 30, 30d supply, fill #2
  Filled 2023-06-22 – 2023-06-28 (×2): qty 30, 30d supply, fill #3

## 2023-03-25 ENCOUNTER — Ambulatory Visit: Payer: 59 | Admitting: *Deleted

## 2023-03-25 ENCOUNTER — Other Ambulatory Visit: Payer: Self-pay

## 2023-03-25 ENCOUNTER — Encounter: Payer: Self-pay | Admitting: Family Medicine

## 2023-03-25 ENCOUNTER — Ambulatory Visit: Payer: 59 | Admitting: Family Medicine

## 2023-03-25 ENCOUNTER — Other Ambulatory Visit (HOSPITAL_BASED_OUTPATIENT_CLINIC_OR_DEPARTMENT_OTHER): Payer: Self-pay

## 2023-03-25 VITALS — BP 114/71 | HR 98 | Temp 98.2°F | Ht 71.0 in | Wt 237.0 lb

## 2023-03-25 VITALS — BP 114/71 | HR 98 | Ht 71.0 in | Wt 237.4 lb

## 2023-03-25 DIAGNOSIS — R490 Dysphonia: Secondary | ICD-10-CM | POA: Diagnosis not present

## 2023-03-25 DIAGNOSIS — Z0000 Encounter for general adult medical examination without abnormal findings: Secondary | ICD-10-CM

## 2023-03-25 NOTE — Progress Notes (Signed)
Acute Office Visit  Subjective:     Patient ID: KAWAI DEADMON, male    DOB: [birth year], 67 y.o.   MRN: [id number]  Chief Complaint  Patient presents with   Hoarse    HPI Patient is in today for hoarse voice.   Discussed the use of AI scribe software for clinical note transcription with the patient, who gave verbal consent to proceed.  History of Present Illness   The patient, with a history of GERD presents with a two-week history of voice loss and a persistent burning sensation in the esophagus. The burning sensation is not associated with meals and is present throughout the day. The patient reports that drinking exacerbates the discomfort. Despite these symptoms, the patient denies any pain, cough, fatigue, fever, chills, body aches, or recent cold or allergy symptoms.  The patient is currently on daily Protonix for GERD management and reports adherence to the medication regimen. There have been no recent changes to the patient's diet, activity level, or medication regimen. The patient denies any recent weight loss or difficulty swallowing.  The patient was recently hospitalized due to dehydration and gastrointestinal symptoms, specifically diarrhea. However, there were no symptoms of vomiting or nausea reported during that time.   The patient denies any history of smoking or alcohol use. The burning sensation is localized to the upper esophagus and does not radiate. The patient reports no other associated symptoms.          ROS All review of systems negative except what is listed in the HPI      Objective:    BP 114/71   Pulse 98   Temp 98.2 F (36.8 C) (Oral)   Ht 5\' 11"  (1.803 m)   Wt 237 lb (107.5 kg)   SpO2 95%   BMI 33.05 kg/m    Physical Exam Vitals reviewed.  Constitutional:      Appearance: Normal appearance.  HENT:     Head: Normocephalic and atraumatic.     Nose: Nose normal.     Mouth/Throat:     Mouth: Mucous membranes are moist.      Pharynx: Oropharynx is clear. No oropharyngeal exudate or posterior oropharyngeal erythema.     Comments: Mild postnasal drainage; no lesions or inflammation noted Cardiovascular:     Rate and Rhythm: Normal rate and regular rhythm.     Heart sounds: Normal heart sounds.  Pulmonary:     Effort: Pulmonary effort is normal.     Breath sounds: Normal breath sounds.  Musculoskeletal:     Cervical back: No tenderness.  Lymphadenopathy:     Cervical: No cervical adenopathy.  Skin:    General: Skin is warm and dry.  Neurological:     Mental Status: He is alert and oriented to person, place, and time.  Psychiatric:        Mood and Affect: Mood normal.        Behavior: Behavior normal.        Thought Content: Thought content normal.        Judgment: Judgment normal.     No results found for any visits on 03/25/23.      Assessment & Plan:   Problem List Items Addressed This Visit   None Visit Diagnoses     Voice hoarseness    -  Primary   Relevant Orders   Ambulatory referral to ENT     Temporarily increase your Protonix to twice a day for the next 10-14 days Review lifestyle  measures for reflux below Referral to ENT to further evaluate other causes for your new onset hoarseness  Please contact office for follow-up if symptoms do not improve or worsen. Seek emergency care if symptoms become severe.  Lifestyle measures for reflux: - Avoid meals or carbonated beverages within 3 hours of bedtime - Minimize intake of fried, fatty, and spicy foods (this will help decrease gastric acid production) - Raise the head of the bed using 4-6 inch blocks (especially if symptoms are present at night) - Maintain a healthy weight and avoid tight fitting clothes, especially around the waist  - Avoid foods that relax the sphincter or worsen symptoms (chocolate, peppermint, fatty foods, citrus, spicy foods, tomatoes, coffee, caffeine)  - Minimize use of NSAIDs (ibuprofen, Aleve, etc), nicotine,  and alcohol  No orders of the defined types were placed in this encounter.   Return if symptoms worsen or fail to improve.  Clayborne Dana, NP

## 2023-03-25 NOTE — Progress Notes (Signed)
Subjective:   James [name] is a 67 y.o. male who presents for Medicare Annual/Subsequent preventive examination.  Visit Complete: In person   Cardiac Risk Factors include: advanced age (>53men, >22 women);diabetes mellitus;dyslipidemia;male gender;hypertension;obesity (BMI >30kg/m2)     Objective:    Today's Vitals   03/25/23 1453  BP: 114/71  Pulse: 98  SpO2: 95%  Weight: 237 lb 6.4 oz (107.7 kg)  Height: 5\' 11"  (1.803 m)   Body mass index is 33.11 kg/m.     03/25/2023    3:02 PM 02/12/2023   12:43 PM 11/12/2022   11:26 AM 03/23/2022    2:24 PM 12/25/2021    9:38 PM 12/25/2021    4:48 PM 08/15/2021    6:00 PM  Advanced Directives  Does Patient Have a Medical Advance Directive? Yes No No Yes  No No  Type of Estate agent of [place];Living will   Healthcare Power of [place];Living will     Does patient want to make changes to medical advance directive? No - Patient declined   No - Patient declined     Copy of Healthcare Power of Attorney in Chart? No - copy requested   No - copy requested     Would patient like information on creating a medical advance directive?  No - Patient declined No - Patient declined  No - Patient declined  No - Patient declined    Current Medications (verified) Outpatient Encounter Medications as of 03/25/2023  Medication Sig   acetaminophen (TYLENOL) 325 MG tablet Take 2 tablets (650 mg total) by mouth every 4 (four) hours as needed for headache or mild pain. (Patient not taking: Reported on 02/11/2023)   adalimumab (HUMIRA, 2 PEN,) 40 MG/0.8ML PNKT pen Inject 40 mg (0.8 ml) under the skin every other week   amLODipine (NORVASC) 5 MG tablet Take 1 tablet (5 mg total) by mouth daily.   aspirin 81 MG chewable tablet Chew 1 tablet (81 mg total) by mouth daily.   atorvastatin (LIPITOR) 80 MG tablet Take 1 tablet (80 mg total) by mouth at bedtime.   botulinum toxin Type A (BOTOX) 200 units injection Provider to inject 155 units  into the muscles of the head and neck every 3 months. Discard remainder.   carvedilol (COREG) 12.5 MG tablet Take 1 tablet (12.5 mg total) by mouth 2 (two) times daily with a meal.   clobetasol (TEMOVATE) 0.05 % external solution Apply 1 application externally twice a day 14 days.   clonazePAM (KLONOPIN) 0.5 MG tablet Take 1 tablet (0.5 mg total) by mouth at bedtime.   clopidogrel (PLAVIX) 75 MG tablet Take 1 tablet (75 mg total) by mouth daily.   Continuous Glucose Sensor (DEXCOM G6 SENSOR) MISC Use to monitor blood sugar, change after 10 days (Patient taking differently: 1 each by Other route See admin instructions. Use to monitor blood sugar, change after 10 days)   dicyclomine (BENTYL) 20 MG tablet Take 1 tablet (20 mg total) by mouth 2 (two) times daily as needed for up to 5 days for spasms.   dicyclomine (BENTYL) 20 MG tablet Take 1 tablet (20 mg total) by mouth 2 (two) times daily as needed.   DULoxetine (CYMBALTA) 60 MG capsule Take 2 capsules (120 mg total) by mouth daily.   empagliflozin (JARDIANCE) 10 MG TABS tablet Take 1 tablet (10 mg total) by mouth daily with breakfast.   EPINEPHrine (EPIPEN 2-PAK) 0.3 mg/0.3 mL IJ SOAJ injection Inject 0.3 mLs (0.3 mg total) into  the muscle once as needed for up to 1 dose for anaphylaxis.   fenofibrate micronized (LOFIBRA) 134 MG capsule Take 1 capsule (134 mg total) by mouth 3 (three) times a week.   gabapentin (NEURONTIN) 600 MG tablet Take 1 tablet (600 mg total) by mouth 2 (two) times daily.   glimepiride (AMARYL) 4 MG tablet Take 1 tablet (4 mg total) by mouth daily before breakfast.   Insulin Disposable Pump (OMNIPOD 5 G6 PODS, GEN 5,) MISC Use as directed every 3 (three) days.   insulin lispro (HUMALOG) 100 UNIT/ML injection Use up to 60 Units daily in pump (Patient taking differently: Inject 60 Units into the skin See admin instructions. Use up to 60 Units daily in pump)   Insulin Syringe-Needle U-100 (INSULIN SYRINGE 1CC/31GX5/16") 31G X  5/16" 1 ML MISC Use to administer Humalog 3 times a day (Patient taking differently: 1 tablet by Other route See admin instructions. Use to administer Humalog 3 times a day)   isosorbide mononitrate (IMDUR) 120 MG 24 hr tablet Take 1 tablet (120 mg total) by mouth daily.   liraglutide (VICTOZA) 18 MG/3ML SOPN Start with 0.6mg  subcutaneously once a day for 7 days, then increase to 1.2mg  once a day   metFORMIN (GLUCOPHAGE) 500 MG tablet Take 2 tablets (1,000 mg total) by mouth 2 (two) times daily.   nitroGLYCERIN (NITROSTAT) 0.4 MG SL tablet Place 1 tablet (0.4 mg total) under the tongue every 5 (five) minutes for 3 doses as needed for chest pain.   ondansetron (ZOFRAN) 4 MG tablet Take 1 tablet (4 mg total) by mouth every 6 (six) hours as needed for nausea or vomiting.   ondansetron (ZOFRAN) 4 MG tablet Take 1 tablet (4 mg total) by mouth every 8 (eight) hours as needed.   pantoprazole (PROTONIX) 40 MG tablet Take 1 tablet (40 mg total) by mouth daily.   traMADol (ULTRAM) 50 MG tablet Take 1 tablet (50 mg total) by mouth every 6 (six) hours as needed for up to 10 doses for severe pain. (Patient not taking: Reported on 01/19/2023)   Facility-Administered Encounter Medications as of 03/25/2023  Medication   botulinum toxin Type A (BOTOX) injection 155 Units    Allergies (verified) Bee venom, Hydrocodone bit-homatrop mbr, Toradol [ketorolac tromethamine], Prednisone, Sulfadiazine, Morphine and codeine, and Sulfa drugs cross reactors   History: Past Medical History:  Diagnosis Date   Abnormal cardiac CT angiography    Acid reflux    Annual physical exam 04/08/2015   Arthritis    Atypical chest pain 06/13/2020   Blood transfusion without reported diagnosis    Body mass index (BMI) 35.0-35.9, adult 04/05/2019   Chronic fatigue 01/28/2015   Chronic headaches    on cymbalta   Chronic migraine w/o aura, not intractable, w/o stat migr 10/24/2018   Cirrhosis (HCC)    Colon polyps    Complication of  anesthesia    problems waking up from anesthesia   Coronary artery disease 01/23/2019   Depression    on cymbalta   Diabetes mellitus with neuropathy (HCC)    Diabetes with neuropathy 04/25/2013   Diverticulitis 03/2013   Dyslipidemia 05/29/2019   Eczema    Elevated LFTs    Epidural lipomatosis 10/05/2018   Essential (primary) hypertension 04/05/2019   Essential hypertension 10/10/2019   Fatty liver    GERD (gastroesophageal reflux disease) 04/28/2011   H/O craniotomy 05/07/2015   Headache 04/28/2011   Hepatitis 10/2017   NASH cirrhosis   History of kidney stones  Hyperlipidemia    Hypersomnia with sleep apnea 01/28/2015   Hypertension    IDA (iron deficiency anemia) 01/24/2019   Idiopathic intracranial hypertension 01/14/2017   Insomnia 04/26/2013   Kidney stone    Liver cirrhosis secondary to NASH (nonalcoholic steatohepatitis) (HCC) 01/02/2016   Low back pain 04/05/2019   Lower back injury 08/14/2019   Morbid obesity (HCC)    Neuromuscular disorder (HCC)    neuropathy   Neuropathy    Nonalcoholic steatohepatitis 10/05/2018   Obstructive hydrocephalus (HCC) 01/28/2015   OSA -- dx ~ 2012, cpap intolerant 09/04/2014    dx ~ 2012, cpap intolerant    PCP NOTES >>> 04/08/2015   Post-op pain 03/19/2019   Post-traumatic hydrocephalus (HCC)    s/p shunts x 2 (first got infected )   Presence of cerebrospinal fluid drainage device 07/28/2011   Psoriasis    sees Dr Lenis Noon   Psoriatic arthritis (HCC)    REM behavioral disorder 01/14/2017   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   see's Dr.Ortmann   Severe obesity (BMI >= 40) (HCC) 01/28/2015   SI (sacroiliac) joint dysfunction 08/14/2019   Sigmoid diverticulitis 04/25/2013   Sleep apnea    no CPAP      Spondylolisthesis, lumbar region 03/16/2019   Stomach ulcer    Testosterone deficiency 04/28/2011   VP (ventriculoperitoneal) shunt status 07/31/2020   Past Surgical History:  Procedure Laterality Date   AMPUTATION Left 12/27/2021    Procedure: AMPUTATION GREAT TOE;  Surgeon: Vivi Barrack, DPM;  Location: [facility] OR;  Service: Podiatry;  Laterality: Left;   BACK SURGERY  1980   BRAIN SURGERY     VP shunts placed in 2007   CHOLECYSTECTOMY N/A 08/25/2017   Procedure: LAPAROSCOPIC CHOLECYSTECTOMY WITH INTRAOPERATIVE CHOLANGIOGRAM;  Surgeon: Griselda Miner, MD;  Location[facility] OR;  Service: General;  Laterality: N/A;   COLONOSCOPY     CORONARY STENT INTERVENTION N/A 09/27/2020   Procedure: CORONARY STENT INTERVENTION;  Surgeon: Corky Crafts, MD;  Location: [facility] INVASIVE CV LAB;  Service: Cardiovascular;  Laterality: N/A;   CORONARY ULTRASOUND/IVUS N/A 09/27/2020   Procedure: Intravascular Ultrasound/IVUS;  Surgeon: Corky Crafts, MD;  Location[facility] INVAS[facility] CV LAB;  Service: Cardiovascular;  Laterality: N/A;   JOINT REPLACEMENT     total hip   LEFT HEART CATH N/A 09/27/2020   Procedure: Left Heart Cath;  Surgeon: Corky Crafts, MD;  Location: [facility] INVASIVE CV LAB;  Service: Cardiovascular;  Laterality: N/A;   LEFT HEART CATH AND CORONARY ANGIOGRAPHY N/A 09/24/2020   Procedure: LEFT HEART CATH AND CORONARY ANGIOGRAPHY;  Surgeon: Corky Crafts, MD;  Location: [facility] INVASIVE CV LAB;  Service: Cardiovascular;  Laterality: N/A;   LEFT HEART CATH AND CORONARY ANGIOGRAPHY N/A 08/18/2021   Procedure: LEFT HEART CATH AND CORONARY ANGIOGRAPHY;  Surgeon: Lennette Bihari, MD;  Location: [facility] INVASIVE CV LAB;  Service: Cardiovascular;  Laterality: N/A;   LUMBAR FUSION  03/16/2019   SHOULDER SURGERY Left 2010   TEE WITHOUT CARDIOVERSION N/A 01/01/2022   Procedure: TRANSESOPHAGEAL ECHOCARDIOGRAM (TEE);  Surgeon: Thomasene Ripple, DO;  Location: [facility] ENDOSCOPY;  Service: Cardiovascular;  Laterality: N/A;   TOE SURGERY Left 2018   TONSILLECTOMY     as a child   TOTAL HIP ARTHROPLASTY Left 2011   UPPER GASTROINTESTINAL ENDOSCOPY  01/04/2020   VENTRICULOPERITONEAL SHUNT  2007   x2   Family History  Problem Relation Age of Onset    Other Mother    Lung cancer Father  alive, former smoker    Heart disease Brother        MI age 65   Other Brother        Murdered   Down syndrome Son    Diabetes Neg Hx    Prostate cancer Neg Hx    Colon cancer Neg Hx    Stomach cancer Neg Hx    Pancreatic cancer Neg Hx    Liver disease Neg Hx    Social History   Socioeconomic History   Marital status: Married    Spouse name: Burna Mortimer   Number of children: 2   Years of education: Not on file   Highest education level: 12th grade  Occupational History   Occupation: disabled   Tobacco Use   Smoking status: Never   Smokeless tobacco: Never  Vaping Use   Vaping status: Never Used  Substance and Sexual Activity   Alcohol use: No   Drug use: No   Sexual activity: Yes    Partners: Female  Other Topics Concern   Not on file  Social History Narrative   Household-- pt , wife, one adult son with Down's syndrome   younger son lives in [place]   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   Social Determinants of Health   Financial Resource Strain: Low Risk  (02/10/2023)   Overall Financial Resource Strain (CARDIA)    Difficulty of Paying Living Expenses: Not hard at all  Food Insecurity: No Food Insecurity (02/10/2023)   Hunger Vital Sign    Worried About Running Out of Food in the Last Year: Never true    Ran Out of Food in the Last Year: Never true  Transportation Needs: No Transportation Needs (02/10/2023)   PRAPARE - Administrator, Civil Service (Medical): No    Lack of Transportation (Non-Medical): No  Physical Activity: Unknown (02/10/2023)   Exercise Vital Sign    Days of Exercise per Week: 0 days    Minutes of Exercise per Session: Not on file  Stress: No Stress Concern Present (02/10/2023)   [employer] of Occupational Health - Occupational Stress Questionnaire    Feeling of Stress : Only a little  Social Connections: Moderately Isolated (02/10/2023)   Social  Connection and Isolation Panel [NHANES]    Frequency of Communication with Friends and Family: More than three times a week    Frequency of Social Gatherings with Friends and Family: Three times a week    Attends Religious Services: Never    Active Member of Clubs or Organizations: No    Attends Engineer, structural: Not on file    Marital Status: Married    Tobacco Counseling Counseling given: Not Answered   Clinical Intake:  Pre-visit preparation completed: Yes  Pain : No/denies pain  BMI - recorded: 33.11 Nutritional Status: BMI > 30  Obese Nutritional Risks: None Diabetes: Yes CBG done?: No Did pt. bring in CBG monitor from home?: No  How often do you need to have someone help you when you read instructions, pamphlets, or other written materials from your doctor or pharmacy?: 1 - Never  Interpreter Needed?: No  Information entered by :: Donne Anon, CMA   Activities of Daily Living    03/25/2023    2:59 PM  In your present state of health, do you have any difficulty performing the following activities:  Hearing? 0  Vision? 0  Difficulty concentrating or making decisions? 0  Walking or climbing  stairs? 1  Dressing or bathing? 0  Doing errands, shopping? 0  Preparing Food and eating ? N  Using the Toilet? N  In the past six months, have you accidently leaked urine? N  Do you have problems with loss of bowel control? N  Managing your Medications? N  Managing your Finances? N  Housekeeping or managing your Housekeeping? N    Patient Care Team: Wanda Plump, MD as PCP - General (Internal Medicine) Georgeanna Lea, MD as PCP - Cardiology (Cardiology) Bradly Bienenstock, MD as Consulting Physician (Orthopedic Surgery) Arita Miss, MD as Attending Physician (Nephrology) Maeola Harman, MD as Consulting Physician (Neurosurgery) Lbcardiology, Rounding, MD as Rounding Team (Cardiology)  Indicate any recent Medical Services you may have received from  other than [facility] providers in the past year (date may be approximate).     Assessment:   This is a routine wellness examination for Ezra.  Hearing/Vision screen No results found.   Goals Addressed   None    Depression Screen    03/25/2023    3:05 PM 01/20/2023   10:35 AM 07/23/2022   10:37 AM 03/23/2022    2:39 PM 03/23/2022   10:33 AM 06/26/2021   11:26 AM 06/26/2021   10:42 AM  PHQ 2/9 Scores  PHQ - 2 Score 0 4 2 2 2 2 2   PHQ- 9 Score  12 6 9 9 9      Fall Risk    03/25/2023    3:01 PM 01/20/2023    9:59 AM 07/23/2022    9:57 AM 03/23/2022    2:23 PM 03/23/2022   10:41 AM  Fall Risk   Falls in the past year? 1 1 1 1  0  Number falls in past yr: 1 0 1 1 0  Injury with Fall? 0 0 0 0 0  Risk for fall due to : Other (Comment)   History of fall(s)   Risk for fall due to: Comment neuropathy      Follow up Falls evaluation completed Falls evaluation completed Falls evaluation completed Falls evaluation completed Falls evaluation completed    MEDICARE RISK AT HOME: Medicare Risk at Home Any stairs in or around the home?: Yes If so, are there any without handrails?: Yes Home free of loose throw rugs in walkways, pet beds, electrical cords, etc?: Yes Adequate lighting in your home to reduce risk of falls?: Yes Life alert?: No Use of a cane, walker or w/c?: No Grab bars in the bathroom?: Yes Shower chair or bench in shower?: Yes (built in bench) Elevated toilet seat or a handicapped toilet?: Yes (comfort height)  TIMED UP AND GO:  Was the test performed?  Yes  Length of time to ambulate 10 feet: 6 sec Gait steady and fast without use of assistive device    Cognitive Function:        03/25/2023    3:07 PM 03/23/2022    2:29 PM  6CIT Screen  What Year? 0 points 0 points  What month? 0 points 0 points  What time? 0 points 0 points  Count back from 20 0 points 0 points  Months in reverse 2 points 2 points  Repeat phrase 2 points 2 points  Total Score 4 points 4  points    Immunizations Immunization History  Administered Date(s) Administered   Hep A / Hep B 10/28/2015, 11/07/2015, 11/20/2015   Influenza,inj,Quad PF,6+ Mos 04/24/2019, 06/11/2020, 05/20/2021, 03/23/2022   PFIZER Comirnaty(Gray Top)Covid-19 Tri-Sucrose Vaccine 11/29/2020   PFIZER(Purple  Top)SARS-COV-2 Vaccination 09/03/2019, 09/26/2019, 02/24/2020   PNEUMOCOCCAL CONJUGATE-20 12/25/2020   Pfizer Covid-19 Vaccine Bivalent Booster 49yrs & up 05/20/2021   Pfizer(Comirnaty)Fall Seasonal Vaccine 12 years and older 04/28/2022   Pneumococcal Polysaccharide-23 11/16/2017   Td 05/22/2013   Tdap 09/30/2021    TDAP status: Up to date  Flu Vaccine status: Declined, Education has been provided regarding the importance of this vaccine but patient still declined. Advised may receive this vaccine at local pharmacy or Health Dept. Aware to provide a copy of the vaccination record if obtained from local pharmacy or Health Dept. Verbalized acceptance and understanding.  Pneumococcal vaccine status: Up to date  Covid-19 vaccine status: Information provided on how to obtain vaccines.   Qualifies for Shingles Vaccine? Yes   Zostavax completed No   Shingrix Completed?: No.    Education has been provided regarding the importance of this vaccine. Patient has been advised to call insurance company to determine out of pocket expense if they have not yet received this vaccine. Advised may also receive vaccine at local pharmacy or Health Dept. Verbalized acceptance and understanding.  Screening Tests Health Maintenance  Topic Date Due   Zoster Vaccines- Shingrix (1 of 2) Never done   OPHTHALMOLOGY EXAM  10/24/2021   FOOT EXAM  06/26/2022   COVID-19 Vaccine (7 - 2023-24 season) 02/28/2023   Medicare Annual Wellness (AWV)  03/24/2023   INFLUENZA VACCINE  09/27/2023 (Originally 01/28/2023)   HEMOGLOBIN A1C  08/14/2023   Diabetic kidney evaluation - Urine ACR  01/20/2024   Diabetic kidney evaluation - eGFR  measurement  02/12/2024   Colonoscopy  01/19/2028   DTaP/Tdap/Td (3 - Td or Tdap) 10/01/2031   Pneumonia Vaccine 12+ Years old  Completed   Hepatitis C Screening  Completed   HIV Screening  Completed   HPV VACCINES  Aged Out    Health Maintenance  Health Maintenance Due  Topic Date Due   Zoster Vaccines- Shingrix (1 of 2) Never done   OPHTHALMOLOGY EXAM  10/24/2021   FOOT EXAM  06/26/2022   COVID-19 Vaccine (7 - 2023-24 season) 02/28/2023   Medicare Annual Wellness (AWV)  03/24/2023    Colorectal cancer screening: Type of screening: Colonoscopy. Completed 01/19/23. Repeat every 5 years  Lung Cancer Screening: (Low Dose CT Chest recommended if Age 64-80 years, 20 pack-year currently smoking OR have quit w/in 15years.) does not qualify.   Additional Screening:  Hepatitis C Screening: does qualify; Completed 04/25/13  Vision Screening: Recommended annual ophthalmology exams for early detection of glaucoma and other disorders of the eye. Is the patient up to date with their annual eye exam?  Yes  Who is the provider or what is the name of the office in which the patient attends annual eye exams? [facility] If pt is not established with a provider, would they like to be referred to a provider to establish care? No .   Dental Screening: Recommended annual dental exams for proper oral hygiene  Diabetic Foot Exam: Diabetic Foot Exam: Overdue, Pt has been advised about the importance in completing this exam. Pt is scheduled for diabetic foot exam on N/a.  Community Resource Referral / Chronic Care Management: CRR required this visit?  No   CCM required this visit?  No     Plan:     I have personally reviewed and noted the following in the patient's chart:   Medical and social history Use of alcohol, tobacco or illicit drugs  Current medications and supplements including opioid prescriptions.  Patient is not currently taking opioid prescriptions. Functional ability and  status Nutritional status Physical activity Advanced directives List of other physicians Hospitalizations, surgeries, and ER visits in previous 12 months Vitals Screenings to include cognitive, depression, and falls Referrals and appointments  In addition, I have reviewed and discussed with patient certain preventive protocols, quality metrics, and best practice recommendations. A written personalized care plan for preventive services as well as general preventive health recommendations were provided to patient.     Donne Anon, CMA   03/25/2023   After Visit Summary: Sent to mychart  Nurse Notes: None

## 2023-03-25 NOTE — Patient Instructions (Addendum)
Temporarily increase your Protonix to twice a day for the next 10-14 days Review lifestyle measures for reflux below Referral to ENT to further evaluate other causes for your new onset hoarseness  Please contact office for follow-up if symptoms do not improve or worsen. Seek emergency care if symptoms become severe.  Lifestyle measures for reflux: - Avoid meals or carbonated beverages within 3 hours of bedtime - Minimize intake of fried, fatty, and spicy foods (this will help decrease gastric acid production) - Raise the head of the bed using 4-6 inch blocks (especially if symptoms are present at night) - Maintain a healthy weight and avoid tight fitting clothes, especially around the waist  - Avoid foods that relax the sphincter or worsen symptoms (chocolate, peppermint, fatty foods, citrus, spicy foods, tomatoes, coffee, caffeine)  - Minimize use of NSAIDs (ibuprofen, Aleve, etc), nicotine, and alcohol

## 2023-03-25 NOTE — Patient Instructions (Signed)
James [name] , Thank you for taking time to come for your Medicare Wellness Visit. I appreciate your ongoing commitment to your health goals. Please review the following plan we discussed and let me know if I can assist you in the future.     This is a list of the screening recommended for you and due dates:  Health Maintenance  Topic Date Due   Zoster (Shingles) Vaccine (1 of 2) Never done   Eye exam for diabetics  10/24/2021   Complete foot exam   06/26/2022   COVID-19 Vaccine (7 - 2023-24 season) 02/28/2023   Flu Shot  09/27/2023*   Hemoglobin A1C  08/14/2023   Yearly kidney health urinalysis for diabetes  01/20/2024   Yearly kidney function blood test for diabetes  02/12/2024   Medicare Annual Wellness Visit  03/24/2024   Colon Cancer Screening  01/19/2028   DTaP/Tdap/Td vaccine (3 - Td or Tdap) 10/01/2031   Pneumonia Vaccine  Completed   Hepatitis C Screening  Completed   HIV Screening  Completed   HPV Vaccine  Aged Out  *Topic was postponed. The date shown is not the original due date.     Next appointment: Follow up in one year for your annual wellness visit.   Preventive Care 65 Years and Older, Male Preventive care refers to lifestyle choices and visits with your health care provider that can promote health and wellness. What does preventive care include? A yearly physical exam. This is also called an annual well check. Dental exams once or twice a year. Routine eye exams. Ask your health care provider how often you should have your eyes checked. Personal lifestyle choices, including: Daily care of your teeth and gums. Regular physical activity. Eating a healthy diet. Avoiding tobacco and drug use. Limiting alcohol use. Practicing safe sex. Taking low doses of aspirin every day. Taking vitamin and mineral supplements as recommended by your health care provider. What happens during an annual well check? The services and screenings done by your health care provider  during your annual well check will depend on your age, overall health, lifestyle risk factors, and family history of disease. Counseling  Your health care provider may ask you questions about your: Alcohol use. Tobacco use. Drug use. Emotional well-being. Home and relationship well-being. Sexual activity. Eating habits. History of falls. Memory and ability to understand (cognition). Work and work Astronomer. Screening  You may have the following tests or measurements: Height, weight, and BMI. Blood pressure. Lipid and cholesterol levels. These may be checked every 5 years, or more frequently if you are over 43 years old. Skin check. Lung cancer screening. You may have this screening every year starting at age 29 if you have a 30-pack-year history of smoking and currently smoke or have quit within the past 15 years. Fecal occult blood test (FOBT) of the stool. You may have this test every year starting at age 20. Flexible sigmoidoscopy or colonoscopy. You may have a sigmoidoscopy every 5 years or a colonoscopy every 10 years starting at age 106. Prostate cancer screening. Recommendations will vary depending on your family history and other risks. Hepatitis C blood test. Hepatitis B blood test. Sexually transmitted disease (STD) testing. Diabetes screening. This is done by checking your blood sugar (glucose) after you have not eaten for a while (fasting). You may have this done every 1-3 years. Abdominal aortic aneurysm (AAA) screening. You may need this if you are a current or former smoker. Osteoporosis. You may be screened  starting at age 34 if you are at high risk. Talk with your health care provider about your test results, treatment options, and if necessary, the need for more tests. Vaccines  Your health care provider may recommend certain vaccines, such as: Influenza vaccine. This is recommended every year. Tetanus, diphtheria, and acellular pertussis (Tdap, Td) vaccine. You  may need a Td booster every 10 years. Zoster vaccine. You may need this after age 102. Pneumococcal 13-valent conjugate (PCV13) vaccine. One dose is recommended after age 60. Pneumococcal polysaccharide (PPSV23) vaccine. One dose is recommended after age 54. Talk to your health care provider about which screenings and vaccines you need and how often you need them. This information is not intended to replace advice given to you by your health care provider. Make sure you discuss any questions you have with your health care provider. Document Released: 07/12/2015 Document Revised: 03/04/2016 Document Reviewed: 04/16/2015 Elsevier Interactive Patient Education  2017 [employer].  Fall Prevention in the Home Falls can cause injuries. They can happen to people of all ages. There are many things you can do to make your home safe and to help prevent falls. What can I do on the outside of my home? Regularly fix the edges of walkways and driveways and fix any cracks. Remove anything that might make you trip as you walk through a door, such as a raised step or threshold. Trim any bushes or trees on the path to your home. Use bright outdoor lighting. Clear any walking paths of anything that might make someone trip, such as rocks or tools. Regularly check to see if handrails are loose or broken. Make sure that both sides of any steps have handrails. Any raised decks and porches should have guardrails on the edges. Have any leaves, snow, or ice cleared regularly. Use sand or salt on walking paths during winter. Clean up any spills in your garage right away. This includes oil or grease spills. What can I do in the bathroom? Use night lights. Install grab bars by the toilet and in the tub and shower. Do not use towel bars as grab bars. Use non-skid mats or decals in the tub or shower. If you need to sit down in the shower, use a plastic, non-slip stool. Keep the floor dry. Clean up any water that spills  on the floor as soon as it happens. Remove soap buildup in the tub or shower regularly. Attach bath mats securely with double-sided non-slip rug tape. Do not have throw rugs and other things on the floor that can make you trip. What can I do in the bedroom? Use night lights. Make sure that you have a light by your bed that is easy to reach. Do not use any sheets or blankets that are too big for your bed. They should not hang down onto the floor. Have a firm chair that has side arms. You can use this for support while you get dressed. Do not have throw rugs and other things on the floor that can make you trip. What can I do in the kitchen? Clean up any spills right away. Avoid walking on wet floors. Keep items that you use a lot in easy-to-reach places. If you need to reach something above you, use a strong step stool that has a grab bar. Keep electrical cords out of the way. Do not use floor polish or wax that makes floors slippery. If you must use wax, use non-skid floor wax. Do not have throw  rugs and other things on the floor that can make you trip. What can I do with my stairs? Do not leave any items on the stairs. Make sure that there are handrails on both sides of the stairs and use them. Fix handrails that are broken or loose. Make sure that handrails are as long as the stairways. Check any carpeting to make sure that it is firmly attached to the stairs. Fix any carpet that is loose or worn. Avoid having throw rugs at the top or bottom of the stairs. If you do have throw rugs, attach them to the floor with carpet tape. Make sure that you have a light switch at the top of the stairs and the bottom of the stairs. If you do not have them, ask someone to add them for you. What else can I do to help prevent falls? Wear shoes that: Do not have high heels. Have rubber bottoms. Are comfortable and fit you well. Are closed at the toe. Do not wear sandals. If you use a stepladder: Make  sure that it is fully opened. Do not climb a closed stepladder. Make sure that both sides of the stepladder are locked into place. Ask someone to hold it for you, if possible. Clearly mark and make sure that you can see: Any grab bars or handrails. First and last steps. Where the edge of each step is. Use tools that help you move around (mobility aids) if they are needed. These include: Canes. Walkers. Scooters. Crutches. Turn on the lights when you go into a dark area. Replace any light bulbs as soon as they burn out. Set up your furniture so you have a clear path. Avoid moving your furniture around. If any of your floors are uneven, fix them. If there are any pets around you, be aware of where they are. Review your medicines with your doctor. Some medicines can make you feel dizzy. This can increase your chance of falling. Ask your doctor what other things that you can do to help prevent falls. This information is not intended to replace advice given to you by your health care provider. Make sure you discuss any questions you have with your health care provider. Document Released: 04/11/2009 Document Revised: 11/21/2015 Document Reviewed: 07/20/2014 Elsevier Interactive Patient Education  2017 [employer].

## 2023-03-29 ENCOUNTER — Encounter: Payer: Self-pay | Admitting: Neurology

## 2023-03-29 ENCOUNTER — Ambulatory Visit (INDEPENDENT_AMBULATORY_CARE_PROVIDER_SITE_OTHER): Payer: 59 | Admitting: Neurology

## 2023-03-29 ENCOUNTER — Other Ambulatory Visit: Payer: Self-pay

## 2023-03-29 ENCOUNTER — Other Ambulatory Visit (HOSPITAL_BASED_OUTPATIENT_CLINIC_OR_DEPARTMENT_OTHER): Payer: Self-pay

## 2023-03-29 ENCOUNTER — Telehealth: Payer: Self-pay | Admitting: Endocrinology

## 2023-03-29 DIAGNOSIS — G4752 REM sleep behavior disorder: Secondary | ICD-10-CM

## 2023-03-29 DIAGNOSIS — E1165 Type 2 diabetes mellitus with hyperglycemia: Secondary | ICD-10-CM

## 2023-03-29 DIAGNOSIS — G911 Obstructive hydrocephalus: Secondary | ICD-10-CM | POA: Diagnosis not present

## 2023-03-29 DIAGNOSIS — G43709 Chronic migraine without aura, not intractable, without status migrainosus: Secondary | ICD-10-CM | POA: Diagnosis not present

## 2023-03-29 DIAGNOSIS — M542 Cervicalgia: Secondary | ICD-10-CM | POA: Diagnosis not present

## 2023-03-29 MED ORDER — DEXCOM G6 TRANSMITTER MISC
1.0000 | 3 refills | Status: DC
  Filled 2023-03-29: qty 1, 90d supply, fill #0
  Filled 2023-06-25: qty 1, 90d supply, fill #1

## 2023-03-29 MED ORDER — ONABOTULINUMTOXINA 200 UNITS IJ SOLR
155.0000 [IU] | Freq: Once | INTRAMUSCULAR | Status: AC
Start: 2023-03-29 — End: ?

## 2023-03-29 MED ORDER — CLONAZEPAM 0.5 MG PO TABS
0.5000 mg | ORAL_TABLET | Freq: Every day | ORAL | 5 refills | Status: DC
Start: 2023-03-29 — End: 2023-09-28
  Filled 2023-03-29: qty 30, 30d supply, fill #0
  Filled 2023-04-26: qty 30, 30d supply, fill #1
  Filled 2023-05-30: qty 30, 30d supply, fill #2
  Filled 2023-06-28: qty 30, 30d supply, fill #3
  Filled 2023-08-02: qty 30, 30d supply, fill #4
  Filled 2023-09-01: qty 30, 30d supply, fill #5

## 2023-03-29 NOTE — Telephone Encounter (Signed)
MEDICATION: Dexcom G6 Transmitter  PHARMACY:    HAS THE PATIENT CONTACTED THEIR PHARMACY?  No  IS THIS A 90 DAY SUPPLY : yes  IS PATIENT OUT OF MEDICATION: Yes  IF NOT; HOW MUCH IS LEFT:   LAST APPOINTMENT DATE: @8 /15/2024  NEXT APPOINTMENT DATE:@10 /29/2024  DO WE HAVE YOUR PERMISSION TO LEAVE A DETAILED MESSAGE?:  OTHER COMMENTS: This is a new prescription possibly.  Patient is in office now.   **Let patient know to contact pharmacy at the end of the day to make sure medication is ready. **  ** Please notify patient to allow 48-72 hours to process**  **Encourage patient to contact the pharmacy for refills or they can request refills through [facility]**

## 2023-03-29 NOTE — Progress Notes (Signed)
Botox- 200 units x 1 vial Lot: [id number] Expiration:05/2025 NDC: 0023-3921-02  Bacteriostatic 0.9% Sodium Chloride- * mL  VWU:[id number] Expiration: 09/28/2023 [id number]  Dx: G43.709 S/P Witnessed by: Dr.Sater drew up sodium chloride and botox

## 2023-03-29 NOTE — Progress Notes (Signed)
[facility]  PATIENT: James [name] DOB: [birth year]  REFERRING DOCTOR OR PCP:  Willow Ora  SOURCE: patient, notes from Dr. Drue Novel, imaging results and MRI and CT scans on PACS personally reviewed  _________________________________   HISTORICAL  CHIEF COMPLAINT:  Chief Complaint  Patient presents with   Follow-up    Pt in room 10. Here for botox injection for migraines.     HISTORY OF PRESENT ILLNESS:  James [name] is a 67 y.o. man with idiopathic intracranial hypertension and recent increase in the frequency and severity of headache  Update 03/29/2023:  He reports that have done fairly well with the Botox therapy.  Only a few HA, mostly the last couple weeks.   Before Botox he was having daily headaches that were more severe.  Neck pain is also doing better.    When a a migraine occurs it is bilateral most of the time and occipital and frontal located.   When the headaches more severe he has photophobia and phonophobia. Sometimes, headaches improve when he is laying down but other times they do not change between sitting and lying down. Moving usually worsens the headaches.  He did not get a benefit from multiple prophylactic medications including Keppra, gabapentin, Emgality, Cymbalta.    Besides the chronic migraine, he has obstructive sleep apnea but was unable to tolerate CPAP.  We had discussed the inspire device but he is not interested at this time..  We have discussed weight loss might be of benefit since unable to tolerate CPAP    He reports the REM behavior disorder is doing well on nighttime clonazepam. He has had no active dreams since starting it .   No other signs of a synucleinopathy.  He has a VP shunt for idiopathic intracranial hypertension, since 2017.  Other medical issues:   In early 2022, he had 3 cardiac stents after being diagnosed with CAD after presenting with chest pain.   He had a small MI.      He is also on Humira for psoriatis.   He has MASH related cirrhosis.   Chronic Migraine History: He was having daily HA with migrainous features.   He did not get a benefit from multiple prophylactic medications including Keppra, gabapentin, Emgality, Cymbalta.   Botox therapy started July 2021.  Spine: L-spine MRI 08/15/18 showed progressive epidural lipomatosis in the lower lumbar spine with progressive compression of the thecal sac and spinal stenosis at L3-4, L4-5, and L5-S1 compared to the prior study. Progressive facet degeneration and anterolisthesis at L4-5 since 2011.    MRI of the cervical spine performed 04/27/2017 showed  thoracic fusion hardware from C7 and below. There is mild spondylosis and disc bulging at C3-C4 through C6-C7. There does not appear to be any significant foraminal narrowing and there is no spinal stenosis.   Idiopthic intracranial Hypertension: He was diagnosed with idiopathic intracranial hypertension many years ago and had a VP shunt placed in 2007. A revision was performed July 2007 due to infection. He has a programmable Medtronic valve. Due to the increased headaches, it was reprogrammed from 1.5-1.0. Tapping of the shunt showed a pressure 160 (was drained to 140 mm). There was no infection. He also had an MRI of the brain and MR venogram. CT had shown the left-sided ventricular catheter extends into the right caudate head, similar to the previous study the MR venogram (11/12/2016) was reportedly normal. The left transverse and sigmoid sinuses are not well evaluated due to the shunt  reservoir (but also likely he is right dominant).   CT head 3/13 2020 personally reviewed and no evidence of shunt failure        REVIEW OF SYSTEMS: Constitutional: No fevers, chills, sweats, or change in appetite Eyes: No visual changes, double vision, eye pain Ear, nose and throat: No hearing loss, ear pain, nasal congestion, sore throat Cardiovascular: No chest pain, palpitations Respiratory:  No shortness of breath  at rest or with exertion.   No wheezes GastrointestinaI: No nausea, vomiting, diarrhea, abdominal pain, fecal incontinence Genitourinary:  No dysuria, urinary retention or frequency.  No nocturia. Musculoskeletal:  as above Integumentary: psoriasis on Humira Neurological: as above Psychiatric: No depression at this time.  No anxiety Endocrine: No palpitations, diaphoresis, change in appetite, change in weigh or increased thirst Hematologic/Lymphatic:  No anemia, purpura, petechiae. Allergic/Immunologic: No itchy/runny eyes, nasal congestion, recent allergic reactions, rashes  ALLERGIES: Allergies  Allergen Reactions   Bee Venom Anaphylaxis   Hydrocodone Bit-Homatrop Mbr Other (See Comments)    Hallucinations, confusion, delirium Depressed feeling   Toradol [Ketorolac Tromethamine] Other (See Comments)    Hallucinations, confusion, delirium   Prednisone     Patient reports it causes cirrhosis to flare up   Sulfadiazine     NDC XBJY:[id number] NDC MVHQ:[id number] NDC LKGM:[id number]   Morphine And Codeine Other (See Comments)    Hallucinations, back in the 80s. States has taken vicodin before w/o problems    Sulfa Drugs Cross Reactors Rash    HOME MEDICATIONS:  Current Outpatient Medications:    acetaminophen (TYLENOL) 325 MG tablet, Take 2 tablets (650 mg total) by mouth every 4 (four) hours as needed for headache or mild pain., Disp: , Rfl:    adalimumab (HUMIRA, 2 PEN,) 40 MG/0.8ML PNKT pen, Inject 40 mg (0.8 ml) under the skin every other week, Disp: 1.6 mL, Rfl: 2   amLODipine (NORVASC) 5 MG tablet, Take 1 tablet (5 mg total) by mouth daily., Disp: 90 tablet, Rfl: 3   aspirin 81 MG chewable tablet, Chew 1 tablet (81 mg total) by mouth daily., Disp: , Rfl:    atorvastatin (LIPITOR) 80 MG tablet, Take 1 tablet (80 mg total) by mouth at bedtime., Disp: 90 tablet, Rfl: 1   botulinum toxin Type A (BOTOX) 200 units injection, Provider to inject 155 units into the muscles of the  head and neck every 3 months. Discard remainder., Disp: 1 each, Rfl: 2   carvedilol (COREG) 12.5 MG tablet, Take 1 tablet (12.5 mg total) by mouth 2 (two) times daily with a meal., Disp: 180 tablet, Rfl: 1   clobetasol (TEMOVATE) 0.05 % external solution, Apply 1 application externally twice a day 14 days., Disp: 50 mL, Rfl: 1   clonazePAM (KLONOPIN) 0.5 MG tablet, Take 1 tablet (0.5 mg total) by mouth at bedtime., Disp: 30 tablet, Rfl: 5   clopidogrel (PLAVIX) 75 MG tablet, Take 1 tablet (75 mg total) by mouth daily., Disp: 90 tablet, Rfl: 3   Continuous Glucose Sensor (DEXCOM G6 SENSOR) MISC, Use to monitor blood sugar, change after 10 days (Patient taking differently: 1 each by Other route See admin instructions. Use to monitor blood sugar, change after 10 days), Disp: 3 each, Rfl: 3   dicyclomine (BENTYL) 20 MG tablet, Take 1 tablet (20 mg total) by mouth 2 (two) times daily as needed., Disp: 10 tablet, Rfl: 0   DULoxetine (CYMBALTA) 60 MG capsule, Take 2 capsules (120 mg total) by mouth daily., Disp: 180 capsule, Rfl: 1   empagliflozin (  JARDIANCE) 10 MG TABS tablet, Take 1 tablet (10 mg total) by mouth daily with breakfast., Disp: 30 tablet, Rfl: 3   EPINEPHrine (EPIPEN 2-PAK) 0.3 mg/0.3 mL IJ SOAJ injection, Inject 0.3 mLs (0.3 mg total) into the muscle once as needed for up to 1 dose for anaphylaxis., Disp: 2 each, Rfl: 2   fenofibrate micronized (LOFIBRA) 134 MG capsule, Take 1 capsule (134 mg total) by mouth 3 (three) times a week., Disp: 15 capsule, Rfl: 3   gabapentin (NEURONTIN) 600 MG tablet, Take 1 tablet (600 mg total) by mouth 2 (two) times daily., Disp: 180 tablet, Rfl: 1   glimepiride (AMARYL) 4 MG tablet, Take 1 tablet (4 mg total) by mouth daily before breakfast., Disp: 90 tablet, Rfl: 3   Insulin Disposable Pump (OMNIPOD 5 G6 PODS, GEN 5,) MISC, Use as directed every 3 (three) days., Disp: 10 each, Rfl: 3   insulin lispro (HUMALOG) 100 UNIT/ML injection, Use up to 60 Units daily in  pump (Patient taking differently: Inject 60 Units into the skin See admin instructions. Use up to 60 Units daily in pump), Disp: 20 mL, Rfl: 2   Insulin Syringe-Needle U-100 (INSULIN SYRINGE 1CC/31GX5/16") 31G X 5/16" 1 ML MISC, Use to administer Humalog 3 times a day (Patient taking differently: 1 tablet by Other route See admin instructions. Use to administer Humalog 3 times a day), Disp: 100 each, Rfl: 0   isosorbide mononitrate (IMDUR) 120 MG 24 hr tablet, Take 1 tablet (120 mg total) by mouth daily., Disp: 90 tablet, Rfl: 3   liraglutide (VICTOZA) 18 MG/3ML SOPN, Start with 0.6mg  subcutaneously once a day for 7 days, then increase to 1.2mg  once a day, Disp: 6 mL, Rfl: 2   metFORMIN (GLUCOPHAGE) 500 MG tablet, Take 2 tablets (1,000 mg total) by mouth 2 (two) times daily., Disp: 120 tablet, Rfl: 0   nitroGLYCERIN (NITROSTAT) 0.4 MG SL tablet, Place 1 tablet (0.4 mg total) under the tongue every 5 (five) minutes for 3 doses as needed for chest pain., Disp: 25 tablet, Rfl: 6   ondansetron (ZOFRAN) 4 MG tablet, Take 1 tablet (4 mg total) by mouth every 6 (six) hours as needed for nausea or vomiting., Disp: 20 tablet, Rfl: 0   ondansetron (ZOFRAN) 4 MG tablet, Take 1 tablet (4 mg total) by mouth every 8 (eight) hours as needed., Disp: 12 tablet, Rfl: 0   pantoprazole (PROTONIX) 40 MG tablet, Take 1 tablet (40 mg total) by mouth daily., Disp: 90 tablet, Rfl: 1   traMADol (ULTRAM) 50 MG tablet, Take 1 tablet (50 mg total) by mouth every 6 (six) hours as needed for up to 10 doses for severe pain., Disp: 10 tablet, Rfl: 0   dicyclomine (BENTYL) 20 MG tablet, Take 1 tablet (20 mg total) by mouth 2 (two) times daily as needed for up to 5 days for spasms., Disp: 10 tablet, Rfl: 0  Current Facility-Administered Medications:    botulinum toxin Type A (BOTOX) injection 155 Units, 155 Units, Intramuscular, Once, Fiore Detjen A, MD   botulinum toxin Type A (BOTOX) injection 155 Units, 155 Units, Intramuscular,  Once, Rihaan Barrack, Pearletha Furl, MD  PAST MEDICAL HISTORY: Past Medical History:  Diagnosis Date   Abnormal cardiac CT angiography    Acid reflux    Annual physical exam 04/08/2015   Arthritis    Atypical chest pain 06/13/2020   Blood transfusion without reported diagnosis    Body mass index (BMI) 35.0-35.9, adult 04/05/2019   Chronic fatigue 01/28/2015  Chronic headaches    on cymbalta   Chronic migraine w/o aura, not intractable, w/o stat migr 10/24/2018   Cirrhosis (HCC)    Colon polyps    Complication of anesthesia    problems waking up from anesthesia   Coronary artery disease 01/23/2019   Depression    on cymbalta   Diabetes mellitus with neuropathy (HCC)    Diabetes with neuropathy 04/25/2013   Diverticulitis 03/2013   Dyslipidemia 05/29/2019   Eczema    Elevated LFTs    Epidural lipomatosis 10/05/2018   Essential (primary) hypertension 04/05/2019   Essential hypertension 10/10/2019   Fatty liver    GERD (gastroesophageal reflux disease) 04/28/2011   H/O craniotomy 05/07/2015   Headache 04/28/2011   Hepatitis 10/2017   NASH cirrhosis   History of kidney stones    Hyperlipidemia    Hypersomnia with sleep apnea 01/28/2015   Hypertension    IDA (iron deficiency anemia) 01/24/2019   Idiopathic intracranial hypertension 01/14/2017   Insomnia 04/26/2013   Kidney stone    Liver cirrhosis secondary to NASH (nonalcoholic steatohepatitis) (HCC) 01/02/2016   Low back pain 04/05/2019   Lower back injury 08/14/2019   Morbid obesity (HCC)    Neuromuscular disorder (HCC)    neuropathy   Neuropathy    Nonalcoholic steatohepatitis 10/05/2018   Obstructive hydrocephalus (HCC) 01/28/2015   OSA -- dx ~ 2012, cpap intolerant 09/04/2014    dx ~ 2012, cpap intolerant    PCP NOTES >>> 04/08/2015   Post-op pain 03/19/2019   Post-traumatic hydrocephalus (HCC)    s/p shunts x 2 (first got infected )   Presence of cerebrospinal fluid drainage device 07/28/2011   Psoriasis    sees Dr Lenis Noon   Psoriatic  arthritis (HCC)    REM behavioral disorder 01/14/2017   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   see's Dr.Ortmann   Severe obesity (BMI >= 40) (HCC) 01/28/2015   SI (sacroiliac) joint dysfunction 08/14/2019   Sigmoid diverticulitis 04/25/2013   Sleep apnea    no CPAP      Spondylolisthesis, lumbar region 03/16/2019   Stomach ulcer    Testosterone deficiency 04/28/2011   VP (ventriculoperitoneal) shunt status 07/31/2020    PAST SURGICAL HISTORY: Past Surgical History:  Procedure Laterality Date   AMPUTATION Left 12/27/2021   Procedure: AMPUTATION GREAT TOE;  Surgeon: Vivi Barrack, DPM;  Location: [facility] OR;  Service: Podiatry;  Laterality: Left;   BACK SURGERY  1980   BRAIN SURGERY     VP shunts placed in 2007   CHOLECYSTECTOMY N/A 08/25/2017   Procedure: LAPAROSCOPIC CHOLECYSTECTOMY WITH INTRAOPERATIVE CHOLANGIOGRAM;  Surgeon: Griselda Miner, MD;  Location[facility];  Service: General;  Laterality: N/A;   COLONOSCOPY     CORONARY STENT INTERVENTION N/A 09/27/2020   Procedure: CORONARY STENT INTERVENTION;  Surgeon: Corky Crafts, MD;  Location: [facility] INVASIVE CV LAB;  Service: Cardiovascular;  Laterality: N/A;   CORONARY ULTRASOUND/IVUS N/A 09/27/2020   Procedure: Intravascular Ultrasound/IVUS;  Surgeon: Corky Crafts, MD;  Location: [facility] INVASIVE CV LAB;  Service: Cardiovascular;  Laterality: N/A;   JOINT REPLACEMENT     total hip   LEFT HEART CATH N/A 09/27/2020   Procedure: Left Heart Cath;  Surgeon: Corky Crafts, MD;  Location: [facility] INVASIVE CV LAB;  Service: Cardiovascular;  Laterality: N/A;   LEFT HEART CATH AND CORONARY ANGIOGRAPHY N/A 09/24/2020   Procedure: LEFT HEART CATH AND CORONARY ANGIOGRAPHY;  Surgeon: Corky Crafts, MD;  Location: [facility] INVASIVE CV LAB;  Service: Cardiovascular;  Laterality: N/A;   LEFT HEART CATH AND CORONARY ANGIOGRAPHY N/A 08/18/2021   Procedure: LEFT HEART CATH AND CORONARY ANGIOGRAPHY;  Surgeon: Lennette Bihari, MD;  Location: [facility]  INVASIVE CV LAB;  Service: Cardiovascular;  Laterality: N/A;   LUMBAR FUSION  03/16/2019   SHOULDER SURGERY Left 2010   TEE WITHOUT CARDIOVERSION N/A 01/01/2022   Procedure: TRANSESOPHAGEAL ECHOCARDIOGRAM (TEE);  Surgeon: Thomasene Ripple, DO;  Location: [facility] ENDOSCOPY;  Service: Cardiovascular;  Laterality: N/A;   TOE SURGERY Left 2018   TONSILLECTOMY     as a child   TOTAL HIP ARTHROPLASTY Left 2011   UPPER GASTROINTESTINAL ENDOSCOPY  01/04/2020   VENTRICULOPERITONEAL SHUNT  2007   x2    FAMILY HISTORY: Family History  Problem Relation Age of Onset   Other Mother    Lung cancer Father        alive, former smoker    Heart disease Brother        MI age 83   Other Brother        Murdered   Down syndrome Son    Diabetes Neg Hx    Prostate cancer Neg Hx    Colon cancer Neg Hx    Stomach cancer Neg Hx    Pancreatic cancer Neg Hx    Liver disease Neg Hx     SOCIAL HISTORY:  Social History   Socioeconomic History   Marital status: Married    Spouse name: Burna Mortimer   Number of children: 2   Years of education: Not on file   Highest education level: 12th grade  Occupational History   Occupation: disabled   Tobacco Use   Smoking status: Never   Smokeless tobacco: Never  Vaping Use   Vaping status: Never Used  Substance and Sexual Activity   Alcohol use: No   Drug use: No   Sexual activity: Yes    Partners: Female  Other Topics Concern   Not on file  Social History Narrative   Household-- pt , wife, one adult son with Down's syndrome   younger son lives in [place]   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   Social Determinants of Health   Financial Resource Strain: Low Risk  (02/10/2023)   Overall Financial Resource Strain (CARDIA)    Difficulty of Paying Living Expenses: Not hard at all  Food Insecurity: No Food Insecurity (02/10/2023)   Hunger Vital Sign    Worried About Running Out of Food in the Last Year: Never true    Ran Out of  Food in the Last Year: Never true  Transportation Needs: No Transportation Needs (02/10/2023)   PRAPARE - Administrator, Civil Service (Medical): No    Lack of Transportation (Non-Medical): No  Physical Activity: Unknown (02/10/2023)   Exercise Vital Sign    Days of Exercise per Week: 0 days    Minutes of Exercise per Session: Not on file  Stress: No Stress Concern Present (02/10/2023)   [employer] of Occupational Health - Occupational Stress Questionnaire    Feeling of Stress : Only a little  Social Connections: Moderately Isolated (02/10/2023)   Social Connection and Isolation Panel [NHANES]    Frequency of Communication with Friends and Family: More than three times a week    Frequency of Social Gatherings with Friends and Family: Three times a week    Attends Religious Services: Never    Active Member of Clubs or Organizations: No  Attends Banker Meetings: Not on file    Marital Status: Married  Intimate Partner Violence: Not At Risk (03/25/2023)   Humiliation, Afraid, Rape, and Kick questionnaire    Fear of Current or Ex-Partner: No    Emotionally Abused: No    Physically Abused: No    Sexually Abused: No     PHYSICAL EXAM There were no vitals filed for this visit.   There is no height or weight on file to calculate BMI.     General: The patient is well-developed and well-nourished and in no acute distress, he has a VP shunt bulb on the left   Neck: He has mild tenderness at the occiput.  Good range of motion..  No weakness.   Skin: Extremities are without significant edema.  Psoriasis is well controlled.  Neurologic Exam  Mental status: The patient is alert and oriented x 3 at the time of the examination.  No ptosis.  The patient has apparent normal recent and remote memory, with an apparently normal attention span and concentration ability.   Speech is normal.  Cranial nerves: Extraocular movements are full.  Facial strength and  sensation is normal.  Neck strength is normal.  Hearing is normal.  Motor: 5/5 strength  Coordination: Cerebellar testing shows good finger-nose-finger  Gait and station: Station is normal.  The gait is arthritic.  Tandem gait is wide.  Romberg is negative.     DIAGNOSTIC DATA (LABS, IMAGING, TESTING) - I reviewed patient records, labs, notes, testing and imaging myself where available.  Lab Results  Component Value Date   WBC 6.7 02/12/2023   HGB 15.0 02/12/2023   HCT 45.5 02/12/2023   MCV 86.3 02/12/2023   PLT 196 02/12/2023      Component Value Date/Time   NA 138 02/12/2023 1356   NA 143 07/06/2022 0000   K 4.3 02/12/2023 1356   CL 104 02/12/2023 1356   CO2 23 02/12/2023 1356   GLUCOSE 136 (H) 02/12/2023 1356   BUN 25 (H) 02/12/2023 1356   BUN 21 07/06/2022 0000   CREATININE 1.79 (H) 02/12/2023 1356   CALCIUM 9.5 02/12/2023 1356   PROT 8.4 (H) 02/12/2023 1356   ALBUMIN 4.6 02/12/2023 1356   AST 32 02/12/2023 1356   ALT 29 02/12/2023 1356   ALKPHOS 76 02/12/2023 1356   BILITOT 1.6 (H) 02/12/2023 1356   GFRNONAA 42 (L) 02/12/2023 1356   GFRAA 66 08/08/2020 1442   Lab Results  Component Value Date   CHOL 132 01/20/2023   HDL 41.00 01/20/2023   LDLCALC 68 01/30/2022   LDLDIRECT 59.0 01/20/2023   TRIG 240.0 (H) 01/20/2023   CHOLHDL 3 01/20/2023   Lab Results  Component Value Date   HGBA1C 7.3 (A) 02/11/2023   Lab Results  Component Value Date   VITAMINB12 490 06/10/2018   Lab Results  Component Value Date   TSH 1.22 06/26/2021       ASSESSMENT AND PLAN  Chronic migraine w/o aura, not intractable, w/o stat migr - Plan: botulinum toxin Type A (BOTOX) injection 155 Units  1.  Botox 155 units as follows: Frontalis muscle (5 units x 4), corrugators and procerus (5 units x 3), temporalis (5 units x 8), occipitalis (5 units x 4), splenius capitis (12.5 units x 2), splenius cervicis (10 units x 2), C6-C7 paraspinal (5 units x 2), trapezius (12.5 units x  2).  45 units wasted.   2.    Continue clonazepam nightly for REM behavior disorder and  insomnia. 3.  He was unable to tolerate CPAP and does not wish to retry.    We have discussed the inspire device but he is not interested at this time. 4.   rtc 6 months, sooner if new or worsening issues.  Hera Celaya A. Epimenio Foot, MD, [facility] 03/29/2023, 1:08 PM Certified in Neurology, Clinical Neurophysiology, Sleep Medicine, Pain Medicine and Neuroimaging  [facility] [address], [address] [place], [place] [address] [phone]

## 2023-03-31 DIAGNOSIS — K7469 Other cirrhosis of liver: Secondary | ICD-10-CM | POA: Diagnosis not present

## 2023-03-31 DIAGNOSIS — Z79899 Other long term (current) drug therapy: Secondary | ICD-10-CM | POA: Diagnosis not present

## 2023-03-31 DIAGNOSIS — L409 Psoriasis, unspecified: Secondary | ICD-10-CM | POA: Diagnosis not present

## 2023-03-31 DIAGNOSIS — N1832 Chronic kidney disease, stage 3b: Secondary | ICD-10-CM | POA: Diagnosis not present

## 2023-03-31 NOTE — Telephone Encounter (Signed)
Looks like the was sent on 03/29/2023 to Med [facility]

## 2023-04-01 ENCOUNTER — Other Ambulatory Visit: Payer: Self-pay

## 2023-04-01 ENCOUNTER — Other Ambulatory Visit: Payer: Self-pay | Admitting: Pharmacist

## 2023-04-01 MED ORDER — HUMIRA (2 PEN) 40 MG/0.8ML ~~LOC~~ AJKT
AUTO-INJECTOR | SUBCUTANEOUS | 5 refills | Status: DC
  Filled 2023-04-02: qty 1.6, fill #0
  Filled 2023-04-13: qty 1.6, 28d supply, fill #0
  Filled 2023-05-13: qty 1.6, 28d supply, fill #1
  Filled 2023-06-15: qty 1.6, 28d supply, fill #2
  Filled 2023-07-26: qty 1.6, 28d supply, fill #3
  Filled 2023-08-24: qty 1.6, 28d supply, fill #4

## 2023-04-01 MED ORDER — HUMIRA (2 PEN) 40 MG/0.8ML ~~LOC~~ AJKT
AUTO-INJECTOR | SUBCUTANEOUS | 5 refills | Status: DC
Start: 2023-03-31 — End: 2023-04-01

## 2023-04-02 ENCOUNTER — Other Ambulatory Visit: Payer: Self-pay

## 2023-04-06 ENCOUNTER — Other Ambulatory Visit (HOSPITAL_BASED_OUTPATIENT_CLINIC_OR_DEPARTMENT_OTHER): Payer: Self-pay

## 2023-04-06 MED ORDER — INFLUENZA VAC A&B SURF ANT ADJ 0.5 ML IM SUSY
0.5000 mL | PREFILLED_SYRINGE | Freq: Once | INTRAMUSCULAR | 0 refills | Status: AC
  Filled 2023-04-06: qty 0.5, 1d supply, fill #0

## 2023-04-09 ENCOUNTER — Other Ambulatory Visit: Payer: Self-pay

## 2023-04-09 ENCOUNTER — Other Ambulatory Visit (HOSPITAL_BASED_OUTPATIENT_CLINIC_OR_DEPARTMENT_OTHER): Payer: Self-pay

## 2023-04-09 ENCOUNTER — Other Ambulatory Visit: Payer: Self-pay | Admitting: Internal Medicine

## 2023-04-09 MED ORDER — GABAPENTIN 600 MG PO TABS
600.0000 mg | ORAL_TABLET | Freq: Two times a day (BID) | ORAL | 1 refills | Status: DC
  Filled 2023-04-09: qty 180, 90d supply, fill #0
  Filled 2023-07-13: qty 180, 90d supply, fill #1

## 2023-04-11 ENCOUNTER — Other Ambulatory Visit: Payer: Self-pay | Admitting: Endocrinology

## 2023-04-11 DIAGNOSIS — E1165 Type 2 diabetes mellitus with hyperglycemia: Secondary | ICD-10-CM

## 2023-04-12 ENCOUNTER — Other Ambulatory Visit (HOSPITAL_BASED_OUTPATIENT_CLINIC_OR_DEPARTMENT_OTHER): Payer: Self-pay

## 2023-04-12 MED ORDER — METFORMIN HCL 500 MG PO TABS
1000.0000 mg | ORAL_TABLET | Freq: Two times a day (BID) | ORAL | 0 refills | Status: DC
Start: 2023-04-12 — End: 2023-04-16
  Filled 2023-04-12: qty 120, 30d supply, fill #0

## 2023-04-13 ENCOUNTER — Other Ambulatory Visit: Payer: Self-pay

## 2023-04-13 ENCOUNTER — Other Ambulatory Visit (HOSPITAL_COMMUNITY): Payer: Self-pay

## 2023-04-13 NOTE — Progress Notes (Signed)
Specialty Pharmacy Refill Coordination Note  James [name] is a 67 y.o. male contacted today regarding refills of specialty medication(s) Adalimumab   Patient requested Delivery   Delivery date: 04/27/23   Verified address: [address]   [place] [id number]   Medication will be filled on 04/26/23.

## 2023-04-15 ENCOUNTER — Ambulatory Visit (INDEPENDENT_AMBULATORY_CARE_PROVIDER_SITE_OTHER): Payer: 59 | Admitting: Endocrinology

## 2023-04-15 ENCOUNTER — Encounter: Payer: Self-pay | Admitting: Endocrinology

## 2023-04-15 VITALS — BP 130/70 | HR 83 | Resp 20 | Ht 71.0 in | Wt 236.6 lb

## 2023-04-15 DIAGNOSIS — Z794 Long term (current) use of insulin: Secondary | ICD-10-CM

## 2023-04-15 DIAGNOSIS — E1165 Type 2 diabetes mellitus with hyperglycemia: Secondary | ICD-10-CM | POA: Diagnosis not present

## 2023-04-15 LAB — BASIC METABOLIC PANEL
BUN: 30 mg/dL — ABNORMAL HIGH (ref 6–23)
CO2: 23 meq/L (ref 19–32)
Calcium: 9.6 mg/dL (ref 8.4–10.5)
Chloride: 103 meq/L (ref 96–112)
Creatinine, Ser: 1.76 mg/dL — ABNORMAL HIGH (ref 0.40–1.50)
GFR: 40.19 mL/min — ABNORMAL LOW (ref >60.00)
Glucose, Bld: 174 mg/dL — ABNORMAL HIGH (ref 70–99)
Potassium: 4.3 meq/L (ref 3.5–5.1)
Sodium: 140 meq/L (ref 135–145)

## 2023-04-15 NOTE — Progress Notes (Signed)
Outpatient Endocrinology Note [place] Patrycja Mumpower, MD  04/15/23  Patient's Name: James [name]    DOB: 1955/10/19    MRN: [id number]                                                    REASON OF VISIT: Follow up for type 2 diabetes mellitus  PCP: Wanda Plump, MD  HISTORY OF PRESENT ILLNESS:   James [name] is a 67 y.o. old male with with past medical history listed below, is here for follow up of type 2 diabetes mellitus.   Pertinent Diabetes History: He was diagnosed with type 2 diabetes mellitus around 2014.  He was initially treated with metformin and glimepiride.  With the worsening of blood sugar control multidose insulin regimen was started around 2017.  He has been on OmniPod insulin pump from November 2021.  Chronic Diabetes Complications : Retinopathy: no. Last ophthalmology exam was done on annually, reportedly. Nephropathy: CKD, no microalbuminuria. Peripheral neuropathy: yes, on gabapentin Coronary artery disease: no Stroke: no  Relevant comorbidities and cardiovascular risk factors: Obesity: yes Body mass index is 33 kg/m.  Hypertension: yes Hyperlipidemia. Yes, on a statin. He has been following with gastroenterology for liver cirrhosis secondary to NASH.  Current / Home Diabetic regimen includes: Metformin 1000 mg 2 times a day. Jardiance 10 mg daily.  Insulin Pump setting:  OMNIPOD 5 insulin pump settings as follows with Dexcom G6. Basal MN- 0.1u/hour 6AM- 0.3  11AM- 0.5 7PM- 0.8  Bolus CHO Ratio (1unit:CHO) MN- 1:1  Correction/Sensitivity: MN- 1:50  Target:   Active insulin time:  4 hours  Prior diabetic medications: Amaryl was stopped June 2024. Januvia  CONTINUOUS GLUCOSE MONITORING SYSTEM (CGMS) / INSULIN PUMP INTERPRETATION:                         OmniPod 5 Pump & Sensor Download (Reviewed and summarized below.) Pump: Dexcom G6 and OmniPod 5 Dates: October 3 to April 14, 2023  Glucose Management Indicator: % Sensor Average: 170  SD 46 CGM/Sensor usage:100% Time in range :75%  Glycemic Trends:  <54: 0% 54-70: 0% 71-180: 75% 181-250: 25% 251-400: 0%   Average daily carbs entered: 33.3 Average total daily insulin:  24.7 units, Basal: 21%, Bolus: 79%.   Automated mode/automated limited to 100%.  Manual mode 0%.  Automated activity 0%.   Glycemic data:    CONTINUOUS GLUCOSE MONITORING SYSTEM (CGMS) INTERPRETATION: At today's visit, we reviewed CGM downloads. The full report is scanned in the media. Reviewing the CGM trends, blood glucose are as follows:  Dexcom G6 CGM-  Sensor Download (Sensor download was reviewed and summarized below.) Dates: September 27 to October 10 , 2024 for 14 days  Glucose Management Indicator: 7.9% Sensor Average: 191 SD 26 CGM/Sensor usage: 100%  Glycemic Trends:  <54: 0% 54-70: 0% 71-180: 47% 181-250: 41% 251-400: 12%  Interpretation: Mostly postprandial hyperglycemia mainly with lunch and supper with blood sugar up to 300 range, related with missing boluses from the pump and not enough boluses at times. No hypoglycemia.   -Mostly acceptable blood sugar occasional hyperglycemia postprandially up to 250 range related with meals especially with supper and some time with lunch and breakfast.  Acceptable overnight blood sugar.  No hypoglycemia.  Hypoglycemia: Patient has no hypoglycemic episodes. Patient  has hypoglycemia awareness.     Factors modifying glucose control: 1.  Diabetic diet assessment: Not eating much due to abdominal pain/diarrhea/acute gastroenteritis.  2.  Staying active or exercising: No formal exercise.  3.  Medication compliance: compliant most of the time.  Interval history 04/15/23 Patient has been using insulin pump at automated limited mode.  He also resumed Jardiance and metformin.  Pump and CGM data as reviewed above.  He has no complaints today.  REVIEW OF SYSTEMS As per history of present illness.   PAST MEDICAL HISTORY: Past Medical  History:  Diagnosis Date   Abnormal cardiac CT angiography    Acid reflux    Annual physical exam 04/08/2015   Arthritis    Atypical chest pain 06/13/2020   Blood transfusion without reported diagnosis    Body mass index (BMI) 35.0-35.9, adult 04/05/2019   Chronic fatigue 01/28/2015   Chronic headaches    on cymbalta   Chronic migraine w/o aura, not intractable, w/o stat migr 10/24/2018   Cirrhosis (HCC)    Colon polyps    Complication of anesthesia    problems waking up from anesthesia   Coronary artery disease 01/23/2019   Depression    on cymbalta   Diabetes mellitus with neuropathy (HCC)    Diabetes with neuropathy 04/25/2013   Diverticulitis 03/2013   Dyslipidemia 05/29/2019   Eczema    Elevated LFTs    Epidural lipomatosis 10/05/2018   Essential (primary) hypertension 04/05/2019   Essential hypertension 10/10/2019   Fatty liver    GERD (gastroesophageal reflux disease) 04/28/2011   H/O craniotomy 05/07/2015   Headache 04/28/2011   Hepatitis 10/2017   NASH cirrhosis   History of kidney stones    Hyperlipidemia    Hypersomnia with sleep apnea 01/28/2015   Hypertension    IDA (iron deficiency anemia) 01/24/2019   Idiopathic intracranial hypertension 01/14/2017   Insomnia 04/26/2013   Kidney stone    Liver cirrhosis secondary to NASH (nonalcoholic steatohepatitis) (HCC) 01/02/2016   Low back pain 04/05/2019   Lower back injury 08/14/2019   Morbid obesity (HCC)    Neuromuscular disorder (HCC)    neuropathy   Neuropathy    Nonalcoholic steatohepatitis 10/05/2018   Obstructive hydrocephalus (HCC) 01/28/2015   OSA -- dx ~ 2012, cpap intolerant 09/04/2014    dx ~ 2012, cpap intolerant    PCP NOTES >>> 04/08/2015   Post-op pain 03/19/2019   Post-traumatic hydrocephalus (HCC)    s/p shunts x 2 (first got infected )   Presence of cerebrospinal fluid drainage device 07/28/2011   Psoriasis    sees Dr Lenis Noon   Psoriatic arthritis (HCC)    REM behavioral disorder 01/14/2017   Scapholunate  advanced collapse of left wrist 04/2015   see's Dr.Ortmann   Severe obesity (BMI >= 40) (HCC) 01/28/2015   SI (sacroiliac) joint dysfunction 08/14/2019   Sigmoid diverticulitis 04/25/2013   Sleep apnea    no CPAP      Spondylolisthesis, lumbar region 03/16/2019   Stomach ulcer    Testosterone deficiency 04/28/2011   VP (ventriculoperitoneal) shunt status 07/31/2020    PAST SURGICAL HISTORY: Past Surgical History:  Procedure Laterality Date   AMPUTATION Left 12/27/2021   Procedure: AMPUTATION GREAT TOE;  Surgeon: Vivi Barrack, DPM;  Location: [facility] OR;  Service: Podiatry;  Laterality: Left;   BACK SURGERY  1980   BRAIN SURGERY     VP shunts placed in 2007   CHOLECYSTECTOMY N/A 08/25/2017   Procedure: LAPAROSCOPIC CHOLECYSTECTOMY WITH INTRAOPERATIVE CHOLANGIOGRAM;  Surgeon: Griselda Miner, MD;  Location: [facility] OR;  Service: General;  Laterality: N/A;   COLONOSCOPY     CORONARY STENT INTERVENTION N/A 09/27/2020   Procedure: CORONARY STENT INTERVENTION;  Surgeon: Corky Crafts, MD;  Location: [facility] INVASIVE CV LAB;  Service: Cardiovascular;  Laterality: N/A;   CORONARY ULTRASOUND/IVUS N/A 09/27/2020   Procedure: Intravascular Ultrasound/IVUS;  Surgeon: Corky Crafts, MD;  Location: [facility] INVASIVE CV LAB;  Service: Cardiovascular;  Laterality: N/A;   JOINT REPLACEMENT     total hip   LEFT HEART CATH N/A 09/27/2020   Procedure: Left Heart Cath;  Surgeon: Corky Crafts, MD;  Location: [facility] INVASIVE CV LAB;  Service: Cardiovascular;  Laterality: N/A;   LEFT HEART CATH AND CORONARY ANGIOGRAPHY N/A 09/24/2020   Procedure: LEFT HEART CATH AND CORONARY ANGIOGRAPHY;  Surgeon: Corky Crafts, MD;  Location[facility] INVASIVE CV LAB;  Service: Cardiovascular;  Laterality: N/A;   LEFT HEART CATH AND CORONARY ANGIOGRAPHY N/A 08/18/2021   Procedure: LEFT HEART CATH AND CORONARY ANGIOGRAPHY;  Surgeon: Lennette Bihari, MD;  Location: [facility] INVASIVE CV LAB;  Service: Cardiovascular;  Laterality: N/A;    LUMBAR FUSION  03/16/2019   SHOULDER SURGERY Left 2010   TEE WITHOUT CARDIOVERSION N/A 01/01/2022   Procedure: TRANSESOPHAGEAL ECHOCARDIOGRAM (TEE);  Surgeon: Thomasene Ripple, DO;  Location: [facility] ENDOSCOPY;  Service: Cardiovascular;  Laterality: N/A;   TOE SURGERY Left 2018   TONSILLECTOMY     as a child   TOTAL HIP ARTHROPLASTY Left 2011   UPPER GASTROINTESTINAL ENDOSCOPY  01/04/2020   VENTRICULOPERITONEAL SHUNT  2007   x2    ALLERGIES: Allergies  Allergen Reactions   Bee Venom Anaphylaxis   Hydrocodone Bit-Homatrop Mbr Other (See Comments)    Hallucinations, confusion, delirium Depressed feeling   Toradol [Ketorolac Tromethamine] Other (See Comments)    Hallucinations, confusion, delirium   Prednisone     Patient reports it causes cirrhosis to flare up   Sulfadiazine     NDC KGMW:[id number] NDC IHKV:[id number] NDC EPPI:[id number]   Morphine And Codeine Other (See Comments)    Hallucinations, back in the 80s. States has taken vicodin before w/o problems    Sulfa Drugs Cross Reactors Rash    FAMILY HISTORY:  Family History  Problem Relation Age of Onset   Other Mother    Lung cancer Father        alive, former smoker    Heart disease Brother        MI age 101   Other Brother        Murdered   Down syndrome Son    Diabetes Neg Hx    Prostate cancer Neg Hx    Colon cancer Neg Hx    Stomach cancer Neg Hx    Pancreatic cancer Neg Hx    Liver disease Neg Hx     SOCIAL HISTORY: Social History   Socioeconomic History   Marital status: Married    Spouse name: Burna Mortimer   Number of children: 2   Years of education: Not on file   Highest education level: 12th grade  Occupational History   Occupation: disabled   Tobacco Use   Smoking status: Never   Smokeless tobacco: Never  Vaping Use   Vaping status: Never Used  Substance and Sexual Activity   Alcohol use: No   Drug use: No   Sexual activity: Yes    Partners: Female  Other Topics Concern   Not on file  Social  History Narrative  Household-- pt , wife, one adult son with Down's syndrome   younger son lives in [place]   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   Social Determinants of Health   Financial Resource Strain: Low Risk  (02/10/2023)   Overall Financial Resource Strain (CARDIA)    Difficulty of Paying Living Expenses: Not hard at all  Food Insecurity: No Food Insecurity (02/10/2023)   Hunger Vital Sign    Worried About Running Out of Food in the Last Year: Never true    Ran Out of Food in the Last Year: Never true  Transportation Needs: No Transportation Needs (02/10/2023)   PRAPARE - Administrator, Civil Service (Medical): No    Lack of Transportation (Non-Medical): No  Physical Activity: Unknown (02/10/2023)   Exercise Vital Sign    Days of Exercise per Week: 0 days    Minutes of Exercise per Session: Not on file  Stress: No Stress Concern Present (02/10/2023)   [employer] of Occupational Health - Occupational Stress Questionnaire    Feeling of Stress : Only a little  Social Connections: Moderately Isolated (02/10/2023)   Social Connection and Isolation Panel [NHANES]    Frequency of Communication with Friends and Family: More than three times a week    Frequency of Social Gatherings with Friends and Family: Three times a week    Attends Religious Services: Never    Active Member of Clubs or Organizations: No    Attends Engineer, structural: Not on file    Marital Status: Married    MEDICATIONS:  Current Outpatient Medications  Medication Sig Dispense Refill   acetaminophen (TYLENOL) 325 MG tablet Take 2 tablets (650 mg total) by mouth every 4 (four) hours as needed for headache or mild pain.     adalimumab (HUMIRA, 2 PEN,) 40 MG/0.8ML PNKT pen Inject 40mg  (0.42ml) under the skin every other week. 1.6 mL 5   amLODipine (NORVASC) 5 MG tablet Take 1 tablet (5 mg total) by mouth daily. 90 tablet 3   aspirin 81 MG  chewable tablet Chew 1 tablet (81 mg total) by mouth daily.     atorvastatin (LIPITOR) 80 MG tablet Take 1 tablet (80 mg total) by mouth at bedtime. 90 tablet 1   botulinum toxin Type A (BOTOX) 200 units injection Provider to inject 155 units into the muscles of the head and neck every 3 months. Discard remainder. 1 each 2   carvedilol (COREG) 12.5 MG tablet Take 1 tablet (12.5 mg total) by mouth 2 (two) times daily with a meal. 180 tablet 1   clobetasol (TEMOVATE) 0.05 % external solution Apply 1 application externally twice a day 14 days. 50 mL 1   clonazePAM (KLONOPIN) 0.5 MG tablet Take 1 tablet (0.5 mg total) by mouth at bedtime. 30 tablet 5   clopidogrel (PLAVIX) 75 MG tablet Take 1 tablet (75 mg total) by mouth daily. 90 tablet 3   Continuous Glucose Sensor (DEXCOM G6 SENSOR) MISC Use to monitor blood sugar, change after 10 days (Patient taking differently: 1 each by Other route See admin instructions. Use to monitor blood sugar, change after 10 days) 3 each 3   Continuous Glucose Transmitter (DEXCOM G6 TRANSMITTER) MISC 1 Device by Does not apply route continuous. 1 each 3   dicyclomine (BENTYL) 20 MG tablet Take 1 tablet (20 mg total) by mouth 2 (two) times daily as needed. 10 tablet 0   DULoxetine (CYMBALTA) 60 MG capsule Take  2 capsules (120 mg total) by mouth daily. 180 capsule 1   empagliflozin (JARDIANCE) 10 MG TABS tablet Take 1 tablet (10 mg total) by mouth daily with breakfast. 30 tablet 3   EPINEPHrine (EPIPEN 2-PAK) 0.3 mg/0.3 mL IJ SOAJ injection Inject 0.3 mLs (0.3 mg total) into the muscle once as needed for up to 1 dose for anaphylaxis. 2 each 2   fenofibrate micronized (LOFIBRA) 134 MG capsule Take 1 capsule (134 mg total) by mouth 3 (three) times a week. 15 capsule 3   gabapentin (NEURONTIN) 600 MG tablet Take 1 tablet (600 mg total) by mouth 2 (two) times daily. 180 tablet 1   glimepiride (AMARYL) 4 MG tablet Take 1 tablet (4 mg total) by mouth daily before breakfast. 90  tablet 3   Insulin Disposable Pump (OMNIPOD 5 G6 PODS, GEN 5,) MISC Use as directed every 3 (three) days. 10 each 3   insulin lispro (HUMALOG) 100 UNIT/ML injection Use up to 60 Units daily in pump (Patient taking differently: Inject 60 Units into the skin See admin instructions. Use up to 60 Units daily in pump) 20 mL 2   Insulin Syringe-Needle U-100 (INSULIN SYRINGE 1CC/31GX5/16") 31G X 5/16" 1 ML MISC Use to administer Humalog 3 times a day (Patient taking differently: 1 tablet by Other route See admin instructions. Use to administer Humalog 3 times a day) 100 each 0   isosorbide mononitrate (IMDUR) 120 MG 24 hr tablet Take 1 tablet (120 mg total) by mouth daily. 90 tablet 3   liraglutide (VICTOZA) 18 MG/3ML SOPN Start with 0.6mg  subcutaneously once a day for 7 days, then increase to 1.2mg  once a day 6 mL 2   metFORMIN (GLUCOPHAGE) 500 MG tablet Take 2 tablets (1,000 mg total) by mouth 2 (two) times daily. 120 tablet 0   nitroGLYCERIN (NITROSTAT) 0.4 MG SL tablet Place 1 tablet (0.4 mg total) under the tongue every 5 (five) minutes for 3 doses as needed for chest pain. 25 tablet 6   ondansetron (ZOFRAN) 4 MG tablet Take 1 tablet (4 mg total) by mouth every 6 (six) hours as needed for nausea or vomiting. 20 tablet 0   ondansetron (ZOFRAN) 4 MG tablet Take 1 tablet (4 mg total) by mouth every 8 (eight) hours as needed. 12 tablet 0   pantoprazole (PROTONIX) 40 MG tablet Take 1 tablet (40 mg total) by mouth daily. 90 tablet 1   traMADol (ULTRAM) 50 MG tablet Take 1 tablet (50 mg total) by mouth every 6 (six) hours as needed for up to 10 doses for severe pain. 10 tablet 0   dicyclomine (BENTYL) 20 MG tablet Take 1 tablet (20 mg total) by mouth 2 (two) times daily as needed for up to 5 days for spasms. 10 tablet 0   Current Facility-Administered Medications  Medication Dose Route Frequency Provider Last Rate Last Admin   botulinum toxin Type A (BOTOX) injection 155 Units  155 Units Intramuscular Once  Sater, Richard A, MD       botulinum toxin Type A (BOTOX) injection 155 Units  155 Units Intramuscular Once Sater, Pearletha Furl, MD        PHYSICAL EXAM: Vitals:   04/15/23 0914  BP: 130/70  Pulse: 83  Resp: 20  SpO2: 96%  Weight: 236 lb 9.6 oz (107.3 kg)  Height: 5\' 11"  (1.803 m)   Body mass index is 33 kg/m.  Wt Readings from Last 3 Encounters:  04/15/23 236 lb 9.6 oz (107.3 kg)  03/25/23 237  lb (107.5 kg)  03/25/23 237 lb 6.4 oz (107.7 kg)    General: Well developed, well nourished male in no apparent distress.  HEENT: AT/[place], no external lesions.  Eyes: Conjunctiva clear and no icterus. Neck: Neck supple  Lungs: Respirations not labored Neurologic: Alert, oriented, normal speech Extremities / Skin: Dry.   Psychiatric: Does not appear depressed or anxious  Diabetic Foot Exam - Simple   No data filed    LABS Reviewed Lab Results  Component Value Date   HGBA1C 7.3 (A) 02/11/2023   HGBA1C 7.7 (H) 09/01/2022   HGBA1C 7.1 (H) 05/11/2022   Lab Results  Component Value Date   FRUCTOSAMINE 305 (H) 05/16/2021   FRUCTOSAMINE 342 (H) 12/31/2020   FRUCTOSAMINE 317 (H) 06/18/2020   Lab Results  Component Value Date   CHOL 132 01/20/2023   HDL 41.00 01/20/2023   LDLCALC 68 01/30/2022   LDLDIRECT 59.0 01/20/2023   TRIG 240.0 (H) 01/20/2023   CHOLHDL 3 01/20/2023   Lab Results  Component Value Date   MICRALBCREAT 0.6 01/20/2023   MICRALBCREAT 1.5 01/30/2022   Lab Results  Component Value Date   CREATININE 1.79 (H) 02/12/2023   Lab Results  Component Value Date   GFR 29.34 (L) 02/11/2023    ASSESSMENT / PLAN  1. Type 2 diabetes mellitus with hyperglycemia, with long-term current use of insulin (HCC)      Diabetes Mellitus type 2, complicated by peripheral neuropathy and CKD. - Diabetic status / severity: Uncontrolled.  Lab Results  Component Value Date   HGBA1C 7.3 (A) 02/11/2023    - Hemoglobin A1c goal <7%   - Medications: Mostly mealtime  hyperglycemia.  With no enough and missing meal boluses.  Plan: -Continue OmniPod 5 with Dexcom G6 on current setting.  No change in setting today. -Advised patient to bolus for meals 10-15 carb based on the meal size and carbohydrate content.  Currently he has been using carb count of 12 or 13 and missing the meal bolus as well. -Advised to use the automated mode of the insulin pump. -Continue Jardiance 10 mg daily. -He is currently taking metformin 1000 mg 2 times a day.  Will check BMP for the renal function and will adjust the dose of metformin. -He reports he is not taking glimepiride and Victoza.  - Home glucose testing: continue CGM and check blood glucose as needed.  - Discussed/ Gave Hypoglycemia treatment plan.  # Consult : not required at this time.   # Annual urine for microalbuminuria/ creatinine ratio, no microalbuminuria currently. Last  Lab Results  Component Value Date   MICRALBCREAT 0.6 01/20/2023    # Foot check nightly / neuropathy, continue gabapentin.  # Annual dilated diabetic eye exams.   - Diet: Make healthy diabetic food choices - Life style / activity / exercise: Discussed.  2. Blood pressure  -  BP Readings from Last 1 Encounters:  04/15/23 130/70    - Control is in target.  - No change in current plans.  3. Lipid status / Hyperlipidemia - Last  Lab Results  Component Value Date   LDLCALC 68 01/30/2022   - Continue atorvastatin 80 mg daily.  Managed by primary care provider. He is also on fenofibrate 3 times a week.  Diagnoses and all orders for this visit:  Type 2 diabetes mellitus with hyperglycemia, with long-term current use of insulin (HCC) -     Basic metabolic panel; Future -     Basic metabolic panel   DISPOSITION  Follow up in clinic in 3 months suggested.   All questions answered and patient verbalized understanding of the plan.  [place] Marlissa Emerick, MD [facility] [address] [place], [address] [place], [place] [address] Phone # [phone]  At least part of this note was generated using voice recognition software. Inadvertent word errors may have occurred, which were not recognized during the proofreading process.

## 2023-04-15 NOTE — Patient Instructions (Signed)
Continue pump.  Continue jardiance 10 mg daily.  Will check lab for kidney function, and will plan for dose of metformin.

## 2023-04-16 ENCOUNTER — Other Ambulatory Visit (HOSPITAL_BASED_OUTPATIENT_CLINIC_OR_DEPARTMENT_OTHER): Payer: Self-pay

## 2023-04-16 ENCOUNTER — Telehealth: Payer: Self-pay

## 2023-04-16 MED ORDER — METFORMIN HCL 500 MG PO TABS
500.0000 mg | ORAL_TABLET | Freq: Two times a day (BID) | ORAL | 3 refills | Status: DC
Start: 2023-04-16 — End: 2024-02-11
  Filled 2023-04-16 – 2023-05-31 (×3): qty 180, 90d supply, fill #0
  Filled 2023-08-29: qty 180, 90d supply, fill #1
  Filled 2023-09-18 – 2023-11-23 (×3): qty 180, 90d supply, fill #2

## 2023-04-16 NOTE — Telephone Encounter (Signed)
-----   Message from [place] Thapa sent at 04/16/2023  8:50 AM EDT ----- Please notify patient of labs reviewed.  GFR is 40.  Decrease metformin from 1000 mg 2 times a day to 500 mg 2 times a day.

## 2023-04-16 NOTE — Telephone Encounter (Signed)
Lab results and medication dosage change requests given to patient, no further questions at this time.

## 2023-04-16 NOTE — Addendum Note (Signed)
Addended by: Eliyanah Elgersma, [place] on: 04/16/2023 08:49 AM   Modules accepted: Orders

## 2023-04-16 NOTE — Telephone Encounter (Signed)
Patient attempted, VM left requesting call back to discuss results as advised by MD

## 2023-04-23 ENCOUNTER — Other Ambulatory Visit: Payer: Self-pay | Admitting: Internal Medicine

## 2023-04-23 ENCOUNTER — Other Ambulatory Visit: Payer: Self-pay

## 2023-04-23 ENCOUNTER — Other Ambulatory Visit (HOSPITAL_BASED_OUTPATIENT_CLINIC_OR_DEPARTMENT_OTHER): Payer: Self-pay

## 2023-04-23 MED ORDER — CARVEDILOL 12.5 MG PO TABS
12.5000 mg | ORAL_TABLET | Freq: Two times a day (BID) | ORAL | 1 refills | Status: DC
  Filled 2023-04-23: qty 180, 90d supply, fill #0
  Filled 2023-07-24: qty 180, 90d supply, fill #1

## 2023-04-27 ENCOUNTER — Other Ambulatory Visit (HOSPITAL_BASED_OUTPATIENT_CLINIC_OR_DEPARTMENT_OTHER): Payer: Self-pay

## 2023-04-27 ENCOUNTER — Other Ambulatory Visit: Payer: Self-pay

## 2023-04-27 ENCOUNTER — Ambulatory Visit: Payer: 59 | Admitting: Endocrinology

## 2023-04-29 ENCOUNTER — Ambulatory Visit (INDEPENDENT_AMBULATORY_CARE_PROVIDER_SITE_OTHER): Payer: 59 | Admitting: Otolaryngology

## 2023-04-29 ENCOUNTER — Encounter (INDEPENDENT_AMBULATORY_CARE_PROVIDER_SITE_OTHER): Payer: Self-pay

## 2023-04-29 VITALS — Ht 71.0 in | Wt 236.0 lb

## 2023-04-29 DIAGNOSIS — R490 Dysphonia: Secondary | ICD-10-CM | POA: Diagnosis not present

## 2023-04-29 DIAGNOSIS — J385 Laryngeal spasm: Secondary | ICD-10-CM

## 2023-04-29 DIAGNOSIS — J383 Other diseases of vocal cords: Secondary | ICD-10-CM | POA: Diagnosis not present

## 2023-04-29 NOTE — Progress Notes (Signed)
Dear Dr. Reola Calkins, Here is my assessment for our mutual patient, James [name]. Thank you for allowing me the opportunity to care for your patient. Please do not hesitate to contact me should you have any other questions. Sincerely, Dr. [name]  Otolaryngology Clinic Note Referring provider: Dr. Reola Calkins HPI:  James [name] is a 67 y.o. male kindly referred by Dr. Reola Calkins for evaluation of hoarseness For about a month, he was having some hoarseness - did not ever go out completely. Felt like his throat was dry. Would get worse through the day generally. He reports that his voice was "low." No antecedent event, no sickness, hemoptysis, no sore throat or coughing. Denies significant GERD symptoms but he's on a PPI for that. No problems swallowing, SOB, odynophagia, no neck masses, or ear pain. Improving now - about 70% back Drinks mostly cokes and juices. Does not like Water He did not try anything for it. He did not increase his PPI twice per day.  Does see GI for cirrhosis (did endoscopy in July 2024) - no strictures, some varices.   No nasal symptoms  Never smoked, vaped, no alcohol use  PMHx: Diabetes, CKD, Neuropathy on Gabapentin, Cirrhosis, Migraine, IIH s/p VP shunt, On plavix for CAD H&N Surgery: no Personal or FHx of bleeding dz or anesthesia difficulty: no  Independent Review of Additional Tests or Records:  PCP Notes reviewed   PMH/Meds/All/SocHx/FamHx/ROS:   Past Medical History:  Diagnosis Date   Abnormal cardiac CT angiography    Acid reflux    Annual physical exam 04/08/2015   Arthritis    Atypical chest pain 06/13/2020   Blood transfusion without reported diagnosis    Body mass index (BMI) 35.0-35.9, adult 04/05/2019   Chronic fatigue 01/28/2015   Chronic headaches    on cymbalta   Chronic migraine w/o aura, not intractable, w/o stat migr 10/24/2018   Cirrhosis (HCC)    Colon polyps    Complication of anesthesia    problems waking up from anesthesia   Coronary  artery disease 01/23/2019   Depression    on cymbalta   Diabetes mellitus with neuropathy (HCC)    Diabetes with neuropathy 04/25/2013   Diverticulitis 03/2013   Dyslipidemia 05/29/2019   Eczema    Elevated LFTs    Epidural lipomatosis 10/05/2018   Essential (primary) hypertension 04/05/2019   Essential hypertension 10/10/2019   Fatty liver    GERD (gastroesophageal reflux disease) 04/28/2011   H/O craniotomy 05/07/2015   Headache 04/28/2011   Hepatitis 10/2017   NASH cirrhosis   History of kidney stones    Hyperlipidemia    Hypersomnia with sleep apnea 01/28/2015   Hypertension    IDA (iron deficiency anemia) 01/24/2019   Idiopathic intracranial hypertension 01/14/2017   Insomnia 04/26/2013   Kidney stone    Liver cirrhosis secondary to NASH (nonalcoholic steatohepatitis) (HCC) 01/02/2016   Low back pain 04/05/2019   Lower back injury 08/14/2019   Morbid obesity (HCC)    Neuromuscular disorder (HCC)    neuropathy   Neuropathy    Nonalcoholic steatohepatitis 10/05/2018   Obstructive hydrocephalus (HCC) 01/28/2015   OSA -- dx ~ 2012, cpap intolerant 09/04/2014    dx ~ 2012, cpap intolerant    PCP NOTES >>> 04/08/2015   Post-op pain 03/19/2019   Post-traumatic hydrocephalus (HCC)    s/p shunts x 2 (first got infected )   Presence of cerebrospinal fluid drainage device 07/28/2011   Psoriasis    sees Dr Lenis Noon   Psoriatic arthritis (HCC)  REM behavioral disorder 01/14/2017   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   see's Dr.Ortmann   Severe obesity (BMI >= 40) (HCC) 01/28/2015   SI (sacroiliac) joint dysfunction 08/14/2019   Sigmoid diverticulitis 04/25/2013   Sleep apnea    no CPAP      Spondylolisthesis, lumbar region 03/16/2019   Stomach ulcer    Testosterone deficiency 04/28/2011   VP (ventriculoperitoneal) shunt status 07/31/2020     Past Surgical History:  Procedure Laterality Date   AMPUTATION Left 12/27/2021   Procedure: AMPUTATION GREAT TOE;  Surgeon: Vivi Barrack, DPM;  Location: [facility] OR;  Service: Podiatry;  Laterality: Left;   BACK SURGERY  1980   BRAIN SURGERY     VP shunts placed in 2007   CHOLECYSTECTOMY N/A 08/25/2017   Procedure: LAPAROSCOPIC CHOLECYSTECTOMY WITH INTRAOPERATIVE CHOLANGIOGRAM;  Surgeon: Griselda Miner, MD;  Location: [facility] OR;  Service: General;  Laterality: N/A;   COLONOSCOPY     CORONARY STENT INTERVENTION N/A 09/27/2020   Procedure: CORONARY STENT INTERVENTION;  Surgeon: Corky Crafts, MD;  Location: [facility] INVASIVE CV LAB;  Service: Cardiovascular;  Laterality: N/A;   CORONARY ULTRASOUND/IVUS N/A 09/27/2020   Procedure: Intravascular Ultrasound/IVUS;  Surgeon: Corky Crafts, MD;  Location: [facility] INVASIVE CV LAB;  Service: Cardiovascular;  Laterality: N/A;   JOINT REPLACEMENT     total hip   LEFT HEART CATH N/A 09/27/2020   Procedure: Left Heart Cath;  Surgeon: Corky Crafts, MD;  Location: [facility] INVASIVE CV LAB;  Service: Cardiovascular;  Laterality: N/A;   LEFT HEART CATH AND CORONARY ANGIOGRAPHY N/A 09/24/2020   Procedure: LEFT HEART CATH AND CORONARY ANGIOGRAPHY;  Surgeon: Corky Crafts, MD;  Location: [facility] INVASIVE CV LAB;  Service: Cardiovascular;  Laterality: N/A;   LEFT HEART CATH AND CORONARY ANGIOGRAPHY N/A 08/18/2021   Procedure: LEFT HEART CATH AND CORONARY ANGIOGRAPHY;  Surgeon: Lennette Bihari, MD;  Location: [facility] INVASIVE CV LAB;  Service: Cardiovascular;  Laterality: N/A;   LUMBAR FUSION  03/16/2019   SHOULDER SURGERY Left 2010   TEE WITHOUT CARDIOVERSION N/A 01/01/2022   Procedure: TRANSESOPHAGEAL ECHOCARDIOGRAM (TEE);  Surgeon: Thomasene Ripple, DO;  Location: [facility] ENDOSCOPY;  Service: Cardiovascular;  Laterality: N/A;   TOE SURGERY Left 2018   TONSILLECTOMY     as a child   TOTAL HIP ARTHROPLASTY Left 2011   UPPER GASTROINTESTINAL ENDOSCOPY  01/04/2020   VENTRICULOPERITONEAL SHUNT  2007   x2    Family History  Problem Relation Age of Onset   Other Mother    Lung cancer Father        alive,  former smoker    Heart disease Brother        MI age 83   Other Brother        Murdered   Down syndrome Son    Diabetes Neg Hx    Prostate cancer Neg Hx    Colon cancer Neg Hx    Stomach cancer Neg Hx    Pancreatic cancer Neg Hx    Liver disease Neg Hx      Social Connections: Moderately Isolated (02/10/2023)   Social Connection and Isolation Panel [NHANES]    Frequency of Communication with Friends and Family: More than three times a week    Frequency of Social Gatherings with Friends and Family: Three times a week    Attends Religious Services: Never    Active Member of Clubs or Organizations: No    Attends Banker Meetings: Not  on file    Marital Status: Married      Current Outpatient Medications:    acetaminophen (TYLENOL) 325 MG tablet, Take 2 tablets (650 mg total) by mouth every 4 (four) hours as needed for headache or mild pain., Disp: , Rfl:    adalimumab (HUMIRA, 2 PEN,) 40 MG/0.8ML PNKT pen, Inject 40mg  (0.55ml) under the skin every other week., Disp: 1.6 mL, Rfl: 5   amLODipine (NORVASC) 5 MG tablet, Take 1 tablet (5 mg total) by mouth daily., Disp: 90 tablet, Rfl: 3   aspirin 81 MG chewable tablet, Chew 1 tablet (81 mg total) by mouth daily., Disp: , Rfl:    atorvastatin (LIPITOR) 80 MG tablet, Take 1 tablet (80 mg total) by mouth at bedtime., Disp: 90 tablet, Rfl: 1   botulinum toxin Type A (BOTOX) 200 units injection, Provider to inject 155 units into the muscles of the head and neck every 3 months. Discard remainder., Disp: 1 each, Rfl: 2   carvedilol (COREG) 12.5 MG tablet, Take 1 tablet (12.5 mg total) by mouth 2 (two) times daily with a meal., Disp: 180 tablet, Rfl: 1   clobetasol (TEMOVATE) 0.05 % external solution, Apply 1 application externally twice a day 14 days., Disp: 50 mL, Rfl: 1   clonazePAM (KLONOPIN) 0.5 MG tablet, Take 1 tablet (0.5 mg total) by mouth at bedtime., Disp: 30 tablet, Rfl: 5   clopidogrel (PLAVIX) 75 MG tablet, Take 1 tablet  (75 mg total) by mouth daily., Disp: 90 tablet, Rfl: 3   Continuous Glucose Sensor (DEXCOM G6 SENSOR) MISC, Use to monitor blood sugar, change after 10 days (Patient taking differently: 1 each by Other route See admin instructions. Use to monitor blood sugar, change after 10 days), Disp: 3 each, Rfl: 3   Continuous Glucose Transmitter (DEXCOM G6 TRANSMITTER) MISC, 1 Device by Does not apply route continuous., Disp: 1 each, Rfl: 3   dicyclomine (BENTYL) 20 MG tablet, Take 1 tablet (20 mg total) by mouth 2 (two) times daily as needed., Disp: 10 tablet, Rfl: 0   DULoxetine (CYMBALTA) 60 MG capsule, Take 2 capsules (120 mg total) by mouth daily., Disp: 180 capsule, Rfl: 1   empagliflozin (JARDIANCE) 10 MG TABS tablet, Take 1 tablet (10 mg total) by mouth daily with breakfast., Disp: 30 tablet, Rfl: 3   EPINEPHrine (EPIPEN 2-PAK) 0.3 mg/0.3 mL IJ SOAJ injection, Inject 0.3 mLs (0.3 mg total) into the muscle once as needed for up to 1 dose for anaphylaxis., Disp: 2 each, Rfl: 2   fenofibrate micronized (LOFIBRA) 134 MG capsule, Take 1 capsule (134 mg total) by mouth 3 (three) times a week., Disp: 15 capsule, Rfl: 3   gabapentin (NEURONTIN) 600 MG tablet, Take 1 tablet (600 mg total) by mouth 2 (two) times daily., Disp: 180 tablet, Rfl: 1   Insulin Disposable Pump (OMNIPOD 5 G6 PODS, GEN 5,) MISC, Use as directed every 3 (three) days., Disp: 10 each, Rfl: 3   insulin lispro (HUMALOG) 100 UNIT/ML injection, Use up to 60 Units daily in pump (Patient taking differently: Inject 60 Units into the skin See admin instructions. Use up to 60 Units daily in pump), Disp: 20 mL, Rfl: 2   Insulin Syringe-Needle U-100 (INSULIN SYRINGE 1CC/31GX5/16") 31G X 5/16" 1 ML MISC, Use to administer Humalog 3 times a day (Patient taking differently: 1 tablet by Other route See admin instructions. Use to administer Humalog 3 times a day), Disp: 100 each, Rfl: 0   isosorbide mononitrate (IMDUR) 120 MG 24  hr tablet, Take 1 tablet (120 mg  total) by mouth daily., Disp: 90 tablet, Rfl: 3   metFORMIN (GLUCOPHAGE) 500 MG tablet, Take 1 tablet (500 mg total) by mouth 2 (two) times daily., Disp: 180 tablet, Rfl: 3   nitroGLYCERIN (NITROSTAT) 0.4 MG SL tablet, Place 1 tablet (0.4 mg total) under the tongue every 5 (five) minutes for 3 doses as needed for chest pain., Disp: 25 tablet, Rfl: 6   ondansetron (ZOFRAN) 4 MG tablet, Take 1 tablet (4 mg total) by mouth every 6 (six) hours as needed for nausea or vomiting., Disp: 20 tablet, Rfl: 0   ondansetron (ZOFRAN) 4 MG tablet, Take 1 tablet (4 mg total) by mouth every 8 (eight) hours as needed., Disp: 12 tablet, Rfl: 0   pantoprazole (PROTONIX) 40 MG tablet, Take 1 tablet (40 mg total) by mouth daily., Disp: 90 tablet, Rfl: 1   traMADol (ULTRAM) 50 MG tablet, Take 1 tablet (50 mg total) by mouth every 6 (six) hours as needed for up to 10 doses for severe pain., Disp: 10 tablet, Rfl: 0   dicyclomine (BENTYL) 20 MG tablet, Take 1 tablet (20 mg total) by mouth 2 (two) times daily as needed for up to 5 days for spasms., Disp: 10 tablet, Rfl: 0  Current Facility-Administered Medications:    botulinum toxin Type A (BOTOX) injection 155 Units, 155 Units, Intramuscular, Once, Sater, Richard A, MD   botulinum toxin Type A (BOTOX) injection 155 Units, 155 Units, Intramuscular, Once, Sater, Pearletha Furl, MD   Physical Exam:   Ht 5\' 11"  (1.803 m)   Wt 236 lb (107 kg)   BMI 32.92 kg/m    Salient findings:  CN II-XII intact  Bilateral EAC clear and TM intact with well pneumatized middle ear spaces Anterior rhinoscopy: Septum mild deviation right; bilateral inferior turbinates without significant hypertrophy No lesions of oral cavity/oropharynx; edentulous No obviously palpable neck masses/lymphadenopathy/thyromegaly No respiratory distress or stridor; voice quality class 1.5 - no throat clearing.   Procedures:  Procedure Note Pre-procedure diagnosis:  Dysphonia  Post-procedure diagnosis:  Same Procedure: Transnasal Fiberoptic Laryngoscopy, CPT 40981 - Mod 25 Indication: dysphonia Complications: None apparent EBL: 0 mL Date: 04/29/23   The procedure was undertaken to further evaluate the patient's complaint of dysphonia, with mirror exam inadequate for appropriate examination due to gag reflex and poor patient tolerance  Procedure:  Patient was identified as correct patient. Verbal consent was obtained. The nose was sprayed with oxymetazoline and 4% lidocaine. The The flexible laryngoscope was passed through the nose to view the nasal cavity, pharynx (oropharynx, hypopharynx) and larynx.  The larynx was examined at rest and during multiple phonatory tasks. Documentation was obtained and reviewed with patient. The scope was removed. The patient tolerated the procedure well.  Findings: The nasal cavity and nasopharynx did not reveal any masses or lesions, mucosa appeared to be without obvious lesions. The tongue base, pharyngeal walls, piriform sinuses, vallecula, epiglottis and postcricoid region are normal in appearance. Mildly omega shaped epiglottis. The visualized portion of the subglottis and proximal trachea is widely patent. The vocal folds are mobile bilaterally. There are no lesions on the free edge of the vocal folds nor elsewhere in the larynx worrisome for malignancy.  AP compression of supraglottis consistent with MTD. Bilateral vocal fold atrophy - appears age appropriately    Electronically signed by: Read Drivers, MD 04/29/2023 10:50 AM   Impression & Plans:  James [name] is a 67 y.o. male with: Dysphonia - improving Laryngeal  spasm Muscle tension dysphonia Vocal fold atrophy - Most likely related to laryngeal dryness and muscle tension dysphonia. Unclear precipitant and etiology (laryngitis perhaps?) but now with MTD. TFL reassuring and he is improving. No GERD sx or endoscopic signs of LPR but he is already on a PPI Given this, I think a conservative  management strategy is best. We will improve his laryngeal hydration. Also discussed follow up and he would like to follow up as needed if things get worse, which seems reasonable  I have personally spent 34 minutes involved in face-to-face and non-face-to-face activities for this patient on the day of the visit.  Professional time spent includes the following activities, in addition to those noted in the documentation: preparing to see the patient (review of outside documentation and results), performing a medically appropriate examination and/or evaluation, counseling and educating the patient/family/caregiver, ordering medications, performing procedures (TFL), referring and communicating with other healthcare professionals, documenting clinical information in the electronic or other health record, independently interpreting results and communicating results with the patient/family/caregiver.     Thank you for allowing me the opportunity to care for your patient. Please do not hesitate to contact me should you have any other questions.  Sincerely, [name], MD Otolarynoglogist (ENT), [facility] Phone: [phone] Fax: [phone]  04/29/2023, 10:31 AM

## 2023-05-05 ENCOUNTER — Other Ambulatory Visit: Payer: Self-pay

## 2023-05-11 ENCOUNTER — Other Ambulatory Visit (HOSPITAL_BASED_OUTPATIENT_CLINIC_OR_DEPARTMENT_OTHER): Payer: Self-pay

## 2023-05-11 MED ORDER — COMIRNATY 30 MCG/0.3ML IM SUSY
0.3000 mL | PREFILLED_SYRINGE | Freq: Once | INTRAMUSCULAR | 0 refills | Status: AC
  Filled 2023-05-11: qty 0.3, 1d supply, fill #0

## 2023-05-13 ENCOUNTER — Other Ambulatory Visit: Payer: Self-pay

## 2023-05-13 ENCOUNTER — Other Ambulatory Visit (HOSPITAL_COMMUNITY): Payer: Self-pay

## 2023-05-13 NOTE — Progress Notes (Signed)
Specialty Pharmacy Refill Coordination Note  James [name] is a 67 y.o. male contacted today regarding refills of specialty medication(s) Adalimumab   Patient requested Delivery   Delivery date: 05/20/23   Verified address: [address]  [place] [address]   Medication will be filled on 05/19/23.

## 2023-05-19 ENCOUNTER — Other Ambulatory Visit (HOSPITAL_COMMUNITY): Payer: Self-pay

## 2023-05-31 ENCOUNTER — Other Ambulatory Visit (HOSPITAL_BASED_OUTPATIENT_CLINIC_OR_DEPARTMENT_OTHER): Payer: Self-pay

## 2023-05-31 ENCOUNTER — Other Ambulatory Visit: Payer: Self-pay

## 2023-06-01 ENCOUNTER — Other Ambulatory Visit (HOSPITAL_BASED_OUTPATIENT_CLINIC_OR_DEPARTMENT_OTHER): Payer: Self-pay

## 2023-06-04 ENCOUNTER — Other Ambulatory Visit: Payer: Self-pay

## 2023-06-04 ENCOUNTER — Other Ambulatory Visit (HOSPITAL_COMMUNITY): Payer: Self-pay

## 2023-06-07 ENCOUNTER — Other Ambulatory Visit: Payer: Self-pay | Admitting: Endocrinology

## 2023-06-07 ENCOUNTER — Other Ambulatory Visit (HOSPITAL_BASED_OUTPATIENT_CLINIC_OR_DEPARTMENT_OTHER): Payer: Self-pay

## 2023-06-07 MED ORDER — DEXCOM G6 SENSOR MISC
3 refills | Status: DC
  Filled 2023-06-07 – 2023-06-22 (×2): qty 3, 30d supply, fill #0

## 2023-06-09 ENCOUNTER — Other Ambulatory Visit (HOSPITAL_BASED_OUTPATIENT_CLINIC_OR_DEPARTMENT_OTHER): Payer: Self-pay

## 2023-06-11 ENCOUNTER — Telehealth: Payer: Self-pay | Admitting: Cardiology

## 2023-06-11 NOTE — Telephone Encounter (Signed)
Pt c/o medication issue:  1. Name of Medication: Plavix  2. How are you currently taking this medication (dosage and times per day)?   3. Are you having a reaction (difficulty breathing--STAT)?   4. What is your medication issue? Patient said he stopped taking his Plavix for about 2 weeks, because of bleeding bruising

## 2023-06-15 ENCOUNTER — Other Ambulatory Visit: Payer: Self-pay

## 2023-06-15 ENCOUNTER — Ambulatory Visit: Payer: 59 | Attending: Internal Medicine | Admitting: Pharmacist

## 2023-06-15 DIAGNOSIS — Z79899 Other long term (current) drug therapy: Secondary | ICD-10-CM

## 2023-06-15 NOTE — Progress Notes (Signed)
S: Patient has a follow up today for his specialty medication, Humira.   Patient is currently taking Humira for psoriatic arthritis. Patient is managed by Dr. Sharyn Lull for this.   Adherence: denies any missed doses   Efficacy: reports that it is still working well for him.  Dosing: SubQ: 40 mg every other week  Drug-drug interactions:none  Screening: TB test: negative per patient  Hepatitis: completed prior to drug initiation   Monitoring: S/sx of infection: none currently CBC: last CBC (see below) S/sx of hypersensitivity: none S/sx of malignancy: none S/sx of heart failure: no diagnosis of HF    O:     Lab Results  Component Value Date   WBC 6.7 02/12/2023   HGB 15.0 02/12/2023   HCT 45.5 02/12/2023   MCV 86.3 02/12/2023   PLT 196 02/12/2023      Chemistry      Component Value Date/Time   NA 140 04/15/2023 0954   NA 143 07/06/2022 0000   K 4.3 04/15/2023 0954   CL 103 04/15/2023 0954   CO2 23 04/15/2023 0954   BUN 30 (H) 04/15/2023 0954   BUN 21 07/06/2022 0000   CREATININE 1.76 (H) 04/15/2023 0954   GLU 146 07/06/2022 0000      Component Value Date/Time   CALCIUM 9.6 04/15/2023 0954   ALKPHOS 76 02/12/2023 1356   AST 32 02/12/2023 1356   ALT 29 02/12/2023 1356   BILITOT 1.6 (H) 02/12/2023 1356       A/P: 1. Medication review: Patient on Humira for psoriatic arthritis and is tolerating it well with no adverse effects and reported control of psoriatic arthritis. Patient has been stable on medication for a while now. Patient denies any questions/concerns. No recommendations for any changes.   Butch Penny, PharmD, P[place]y [place], CPP Clinical Pharmacist [facility] [phone]

## 2023-06-15 NOTE — Progress Notes (Signed)
Specialty Pharmacy Refill Coordination Note  James [name] is a 67 y.o. male contacted today regarding refills of specialty medication(s) Adalimumab (Humira (2 Pen)); OnabotulinumtoxinA (BOTOX)   Patient requested Delivery   Delivery date: 06/29/23   Verified address: [address] [place] [address]   Medication will be filled on 12.30.24.   Botox will be filled 12.27.24 and couriered to [facility] 12.30.24.

## 2023-06-17 NOTE — Telephone Encounter (Signed)
States she is calling back to speak to the nurse. Please advise

## 2023-06-17 NOTE — Telephone Encounter (Signed)
No vm

## 2023-06-18 NOTE — Telephone Encounter (Signed)
LVM to call regarding message.

## 2023-06-21 ENCOUNTER — Other Ambulatory Visit (HOSPITAL_BASED_OUTPATIENT_CLINIC_OR_DEPARTMENT_OTHER): Payer: Self-pay

## 2023-06-21 NOTE — Telephone Encounter (Signed)
LVM for pt to call regarding message and appt.

## 2023-06-22 ENCOUNTER — Other Ambulatory Visit (HOSPITAL_BASED_OUTPATIENT_CLINIC_OR_DEPARTMENT_OTHER): Payer: Self-pay

## 2023-06-24 ENCOUNTER — Encounter: Payer: Self-pay | Admitting: Cardiology

## 2023-06-25 ENCOUNTER — Other Ambulatory Visit (HOSPITAL_BASED_OUTPATIENT_CLINIC_OR_DEPARTMENT_OTHER): Payer: Self-pay

## 2023-06-25 ENCOUNTER — Other Ambulatory Visit: Payer: Self-pay

## 2023-06-25 ENCOUNTER — Telehealth: Payer: Self-pay | Admitting: Cardiology

## 2023-06-25 NOTE — Telephone Encounter (Signed)
LVM for patient to call and schedule follow up/kbl 06/25/23

## 2023-06-28 ENCOUNTER — Other Ambulatory Visit (HOSPITAL_BASED_OUTPATIENT_CLINIC_OR_DEPARTMENT_OTHER): Payer: Self-pay

## 2023-06-28 ENCOUNTER — Other Ambulatory Visit: Payer: Self-pay

## 2023-06-29 ENCOUNTER — Other Ambulatory Visit (HOSPITAL_BASED_OUTPATIENT_CLINIC_OR_DEPARTMENT_OTHER): Payer: Self-pay

## 2023-07-06 ENCOUNTER — Encounter: Payer: Self-pay | Admitting: Neurology

## 2023-07-06 ENCOUNTER — Ambulatory Visit (INDEPENDENT_AMBULATORY_CARE_PROVIDER_SITE_OTHER): Payer: 59 | Admitting: Neurology

## 2023-07-06 DIAGNOSIS — G911 Obstructive hydrocephalus: Secondary | ICD-10-CM

## 2023-07-06 DIAGNOSIS — G43709 Chronic migraine without aura, not intractable, without status migrainosus: Secondary | ICD-10-CM | POA: Diagnosis not present

## 2023-07-06 DIAGNOSIS — M542 Cervicalgia: Secondary | ICD-10-CM | POA: Diagnosis not present

## 2023-07-06 DIAGNOSIS — G4733 Obstructive sleep apnea (adult) (pediatric): Secondary | ICD-10-CM | POA: Diagnosis not present

## 2023-07-06 DIAGNOSIS — G4752 REM sleep behavior disorder: Secondary | ICD-10-CM | POA: Diagnosis not present

## 2023-07-06 MED ORDER — ONABOTULINUMTOXINA 200 UNITS IJ SOLR
155.0000 [IU] | Freq: Once | INTRAMUSCULAR | Status: AC
Start: 2023-07-06 — End: 2023-07-06
  Administered 2023-07-06: 155 [IU] via INTRAMUSCULAR

## 2023-07-06 NOTE — Progress Notes (Signed)
 [facility]  PATIENT: James [name] DOB: 1956/06/20  REFERRING DOCTOR OR PCP:  Aloysius Mech  SOURCE: patient, notes from Dr. Mech, imaging results and MRI and CT scans on PACS personally reviewed  _________________________________   HISTORICAL  CHIEF COMPLAINT:  Chief Complaint  Patient presents with   Follow-up    Pt in room 10 alone. Here for botox  for migraines.     HISTORY OF PRESENT ILLNESS:  James [name] is a 68 y.o. man with idiopathic intracranial hypertension and recent increase in the frequency and severity of headache  Update 07/05/2022:  He did very well the first 10 weeks after the last Botox  but HA daily the last 2 weeks.  Before Botox  he was having daily headaches that were more severe.  Neck pain is also doing better.    Neck pain has done well the whole 3 months.  When a a migraine occurs it is bilateral most of the time and occipital and frontal located.   When the headaches more severe he has photophobia and phonophobia. Sometimes, headaches improve when he is laying down but other times they do not change between sitting and lying down. Moving usually worsens the headaches.  He did not get a benefit from multiple prophylactic medications including Keppra , gabapentin , Emgality , Cymbalta .    Besides the chronic migraine, he has obstructive sleep apnea but was unable to tolerate CPAP.  We had discussed the inspire device but he is not interested at this time..  We have discussed weight loss might be of benefit since unable to tolerate CPAP.   HE has lost about 8-9 pounds over the past 1/2 year.   He reports the REM behavior disorder is doing well on nighttime clonazepam . He has had no active dreams since starting it .   No other signs of a synucleinopathy.   It also helps his insomnia  He has a VP shunt for idiopathic intracranial hypertension, since 2017.  Other medical issues:   In early 2022, he had 3 cardiac stents after being diagnosed with CAD  after presenting with chest pain.   He had a small MI.      He is also on Humira  for psoriatis.  He has MASH related cirrhosis.   Chronic Migraine History: He was having daily HA with migrainous features.   He did not get a benefit from multiple prophylactic medications including Keppra , gabapentin , Emgality , Cymbalta .   Botox  therapy started July 2021.  Spine: L-spine MRI 08/15/18 showed progressive epidural lipomatosis in the lower lumbar spine with progressive compression of the thecal sac and spinal stenosis at L3-4, L4-5, and L5-S1 compared to the prior study. Progressive facet degeneration and anterolisthesis at L4-5 since 2011.    MRI of the cervical spine performed 04/27/2017 showed  thoracic fusion hardware from C7 and below. There is mild spondylosis and disc bulging at C3-C4 through C6-C7. There does not appear to be any significant foraminal narrowing and there is no spinal stenosis.   Idiopthic intracranial Hypertension: He was diagnosed with idiopathic intracranial hypertension many years ago and had a VP shunt placed in 2007. A revision was performed July 2007 due to infection. He has a programmable Medtronic valve. Due to the increased headaches, it was reprogrammed from 1.5-1.0. Tapping of the shunt showed a pressure 160 (was drained to 140 mm). There was no infection. He also had an MRI of the brain and MR venogram. CT had shown the left-sided ventricular catheter extends into the right caudate head,  similar to the previous study the MR venogram (11/12/2016) was reportedly normal. The left transverse and sigmoid sinuses are not well evaluated due to the shunt reservoir (but also likely he is right dominant).   CT head 3/13 2020 personally reviewed and no evidence of shunt failure        REVIEW OF SYSTEMS: Constitutional: No fevers, chills, sweats, or change in appetite Eyes: No visual changes, double vision, eye pain Ear, nose and throat: No hearing loss, ear pain, nasal  congestion, sore throat Cardiovascular: No chest pain, palpitations Respiratory:  No shortness of breath at rest or with exertion.   No wheezes GastrointestinaI: No nausea, vomiting, diarrhea, abdominal pain, fecal incontinence Genitourinary:  No dysuria, urinary retention or frequency.  No nocturia. Musculoskeletal:  as above Integumentary: psoriasis on Humira  Neurological: as above Psychiatric: No depression at this time.  No anxiety Endocrine: No palpitations, diaphoresis, change in appetite, change in weigh or increased thirst Hematologic/Lymphatic:  No anemia, purpura, petechiae. Allergic/Immunologic: No itchy/runny eyes, nasal congestion, recent allergic reactions, rashes  ALLERGIES: Allergies  Allergen Reactions   Bee Venom Anaphylaxis   Hydrocodone  Bit-Homatrop Mbr Other (See Comments)    Hallucinations, confusion, delirium Depressed feeling   Toradol  [Ketorolac  Tromethamine ] Other (See Comments)    Hallucinations, confusion, delirium   Prednisone     Patient reports it causes cirrhosis to flare up   Sulfadiazine     NDC Rniz:[id number] NDC Rniz:[id number] NDC Rniz:[id number]   Morphine And Codeine Other (See Comments)    Hallucinations, back in the 80s. States has taken vicodin before w/o problems    Sulfa Drugs Cross Reactors Rash    HOME MEDICATIONS:  Current Outpatient Medications:    acetaminophen  (TYLENOL ) 325 MG tablet, Take 2 tablets (650 mg total) by mouth every 4 (four) hours as needed for headache or mild pain., Disp: , Rfl:    adalimumab  (HUMIRA , 2 PEN,) 40 MG/0.8ML AJKT pen, Inject 40mg  (0.69ml) under the skin every other week., Disp: 1.6 mL, Rfl: 5   amLODipine  (NORVASC ) 5 MG tablet, Take 1 tablet (5 mg total) by mouth daily., Disp: 90 tablet, Rfl: 3   aspirin  81 MG chewable tablet, Chew 1 tablet (81 mg total) by mouth daily., Disp: , Rfl:    atorvastatin  (LIPITOR ) 80 MG tablet, Take 1 tablet (80 mg total) by mouth at bedtime., Disp: 90 tablet, Rfl: 1    botulinum toxin Type A  (BOTOX ) 200 units injection, Provider to inject 155 units into the muscles of the head and neck every 3 months. Discard remainder., Disp: 1 each, Rfl: 2   carvedilol  (COREG ) 12.5 MG tablet, Take 1 tablet (12.5 mg total) by mouth 2 (two) times daily with a meal., Disp: 180 tablet, Rfl: 1   clobetasol  (TEMOVATE ) 0.05 % external solution, Apply 1 application externally twice a day 14 days., Disp: 50 mL, Rfl: 1   clonazePAM  (KLONOPIN ) 0.5 MG tablet, Take 1 tablet (0.5 mg total) by mouth at bedtime., Disp: 30 tablet, Rfl: 5   clopidogrel  (PLAVIX ) 75 MG tablet, Take 1 tablet (75 mg total) by mouth daily., Disp: 90 tablet, Rfl: 3   Continuous Glucose Sensor (DEXCOM G6 SENSOR) MISC, Use to monitor blood sugar, change after 10 days, Disp: 3 each, Rfl: 3   Continuous Glucose Transmitter (DEXCOM G6 TRANSMITTER) MISC, 1 Device by Does not apply route continuous., Disp: 1 each, Rfl: 3   dicyclomine  (BENTYL ) 20 MG tablet, Take 1 tablet (20 mg total) by mouth 2 (two) times daily as needed., Disp: 10 tablet,  Rfl: 0   DULoxetine  (CYMBALTA ) 60 MG capsule, Take 2 capsules (120 mg total) by mouth daily., Disp: 180 capsule, Rfl: 1   empagliflozin  (JARDIANCE ) 10 MG TABS tablet, Take 1 tablet (10 mg total) by mouth daily with breakfast., Disp: 30 tablet, Rfl: 3   EPINEPHrine  (EPIPEN  2-PAK) 0.3 mg/0.3 mL IJ SOAJ injection, Inject 0.3 mLs (0.3 mg total) into the muscle once as needed for up to 1 dose for anaphylaxis., Disp: 2 each, Rfl: 2   fenofibrate  micronized (LOFIBRA) 134 MG capsule, Take 1 capsule (134 mg total) by mouth 3 (three) times a week., Disp: 15 capsule, Rfl: 3   gabapentin  (NEURONTIN ) 600 MG tablet, Take 1 tablet (600 mg total) by mouth 2 (two) times daily., Disp: 180 tablet, Rfl: 1   Insulin  Disposable Pump (OMNIPOD 5 G6 PODS, GEN 5,) MISC, Use as directed every 3 (three) days., Disp: 10 each, Rfl: 3   insulin  lispro (HUMALOG ) 100 UNIT/ML injection, Use up to 60 Units daily in pump  (Patient taking differently: Inject 60 Units into the skin See admin instructions. Use up to 60 Units daily in pump), Disp: 20 mL, Rfl: 2   Insulin  Syringe-Needle U-100 (INSULIN  SYRINGE 1CC/31GX5/16) 31G X 5/16 1 ML MISC, Use to administer Humalog  3 times a day (Patient taking differently: 1 tablet by Other route See admin instructions. Use to administer Humalog  3 times a day), Disp: 100 each, Rfl: 0   isosorbide  mononitrate (IMDUR ) 120 MG 24 hr tablet, Take 1 tablet (120 mg total) by mouth daily., Disp: 90 tablet, Rfl: 3   metFORMIN  (GLUCOPHAGE ) 500 MG tablet, Take 1 tablet (500 mg total) by mouth 2 (two) times daily., Disp: 180 tablet, Rfl: 3   nitroGLYCERIN  (NITROSTAT ) 0.4 MG SL tablet, Place 1 tablet (0.4 mg total) under the tongue every 5 (five) minutes for 3 doses as needed for chest pain., Disp: 25 tablet, Rfl: 6   ondansetron  (ZOFRAN ) 4 MG tablet, Take 1 tablet (4 mg total) by mouth every 6 (six) hours as needed for nausea or vomiting., Disp: 20 tablet, Rfl: 0   ondansetron  (ZOFRAN ) 4 MG tablet, Take 1 tablet (4 mg total) by mouth every 8 (eight) hours as needed., Disp: 12 tablet, Rfl: 0   pantoprazole  (PROTONIX ) 40 MG tablet, Take 1 tablet (40 mg total) by mouth daily., Disp: 90 tablet, Rfl: 1   traMADol  (ULTRAM ) 50 MG tablet, Take 1 tablet (50 mg total) by mouth every 6 (six) hours as needed for up to 10 doses for severe pain., Disp: 10 tablet, Rfl: 0   dicyclomine  (BENTYL ) 20 MG tablet, Take 1 tablet (20 mg total) by mouth 2 (two) times daily as needed for up to 5 days for spasms., Disp: 10 tablet, Rfl: 0  Current Facility-Administered Medications:    botulinum toxin Type A  (BOTOX ) injection 155 Units, 155 Units, Intramuscular, Once, Skylarr Liz, Charlie LABOR, MD   botulinum toxin Type A  (BOTOX ) injection 155 Units, 155 Units, Intramuscular, Once, Cortlynn Hollinsworth, Charlie LABOR, MD   botulinum toxin Type A  (BOTOX ) injection 155 Units, 155 Units, Intramuscular, Once, Kammy Klett, Charlie LABOR, MD  PAST MEDICAL  HISTORY: Past Medical History:  Diagnosis Date   Abnormal cardiac CT angiography    Acid reflux    Annual physical exam 04/08/2015   Arthritis    Atypical chest pain 06/13/2020   Blood transfusion without reported diagnosis    Body mass index (BMI) 35.0-35.9, adult 04/05/2019   Chronic fatigue 01/28/2015   Chronic headaches    on cymbalta   Chronic migraine w/o aura, not intractable, w/o stat migr 10/24/2018   Cirrhosis (HCC)    Colon polyps    Complication of anesthesia    problems waking up from anesthesia   Coronary artery disease 01/23/2019   Depression    on cymbalta    Diabetes mellitus with neuropathy (HCC)    Diabetes with neuropathy 04/25/2013   Diverticulitis 03/2013   Dyslipidemia 05/29/2019   Eczema    Elevated LFTs    Epidural lipomatosis 10/05/2018   Essential (primary) hypertension 04/05/2019   Essential hypertension 10/10/2019   Fatty liver    GERD (gastroesophageal reflux disease) 04/28/2011   H/O craniotomy 05/07/2015   Headache 04/28/2011   Hepatitis 10/2017   NASH cirrhosis   History of kidney stones    Hyperlipidemia    Hypersomnia with sleep apnea 01/28/2015   Hypertension    IDA (iron  deficiency anemia) 01/24/2019   Idiopathic intracranial hypertension 01/14/2017   Insomnia 04/26/2013   Kidney stone    Liver cirrhosis secondary to NASH (nonalcoholic steatohepatitis) (HCC) 01/02/2016   Low back pain 04/05/2019   Lower back injury 08/14/2019   Morbid obesity (HCC)    Neuromuscular disorder (HCC)    neuropathy   Neuropathy    Nonalcoholic steatohepatitis 10/05/2018   Obstructive hydrocephalus (HCC) 01/28/2015   OSA -- dx ~ 2012, cpap intolerant 09/04/2014    dx ~ 2012, cpap intolerant    PCP NOTES >>> 04/08/2015   Post-op pain 03/19/2019   Post-traumatic hydrocephalus (HCC)    s/p shunts x 2 (first got infected )   Presence of cerebrospinal fluid drainage device 07/28/2011   Psoriasis    sees Dr Leanord   Psoriatic arthritis (HCC)    REM behavioral disorder  01/14/2017   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   see's Dr.Ortmann   Severe obesity (BMI >= 40) (HCC) 01/28/2015   SI (sacroiliac) joint dysfunction 08/14/2019   Sigmoid diverticulitis 04/25/2013   Sleep apnea    no CPAP      Spondylolisthesis, lumbar region 03/16/2019   Stomach ulcer    Testosterone  deficiency 04/28/2011   VP (ventriculoperitoneal) shunt status 07/31/2020    PAST SURGICAL HISTORY: Past Surgical History:  Procedure Laterality Date   AMPUTATION Left 12/27/2021   Procedure: AMPUTATION GREAT TOE;  Surgeon: Gershon Donnice SAUNDERS, DPM;  Location: [facility] OR;  Service: Podiatry;  Laterality: Left;   BACK SURGERY  1980   BRAIN SURGERY     VP shunts placed in 2007   CHOLECYSTECTOMY N/A 08/25/2017   Procedure: LAPAROSCOPIC CHOLECYSTECTOMY WITH INTRAOPERATIVE CHOLANGIOGRAM;  Surgeon: Curvin Deward MOULD, MD;  Location: [facility] OR;  Service: General;  Laterality: N/A;   COLONOSCOPY     CORONARY STENT INTERVENTION N/A 09/27/2020   Procedure: CORONARY STENT INTERVENTION;  Surgeon: Dann Candyce RAMAN, MD;  Location: [facility] INVASIVE CV LAB;  Service: Cardiovascular;  Laterality: N/A;   CORONARY ULTRASOUND/IVUS N/A 09/27/2020   Procedure: Intravascular Ultrasound/IVUS;  Surgeon: Dann Candyce RAMAN, MD;  Location: [facility] INVASIVE CV LAB;  Service: Cardiovascular;  Laterality: N/A;   JOINT REPLACEMENT     total hip   LEFT HEART CATH N/A 09/27/2020   Procedure: Left Heart Cath;  Surgeon: Dann Candyce RAMAN, MD;  Location: [facility] INVASIVE CV LAB;  Service: Cardiovascular;  Laterality: N/A;   LEFT HEART CATH AND CORONARY ANGIOGRAPHY N/A 09/24/2020   Procedure: LEFT HEART CATH AND CORONARY ANGIOGRAPHY;  Surgeon: Dann Candyce RAMAN, MD;  Location: [facility] INVASIVE CV LAB;  Service: Cardiovascular;  Laterality: N/A;   LEFT  HEART CATH AND CORONARY ANGIOGRAPHY N/A 08/18/2021   Procedure: LEFT HEART CATH AND CORONARY ANGIOGRAPHY;  Surgeon: Burnard Debby LABOR, MD;  Location: [facility] INVASIVE CV LAB;  Service: Cardiovascular;   Laterality: N/A;   LUMBAR FUSION  03/16/2019   SHOULDER SURGERY Left 2010   TEE WITHOUT CARDIOVERSION N/A 01/01/2022   Procedure: TRANSESOPHAGEAL ECHOCARDIOGRAM (TEE);  Surgeon: Sheena Pugh, DO;  Location: [facility] ENDOSCOPY;  Service: Cardiovascular;  Laterality: N/A;   TOE SURGERY Left 2018   TONSILLECTOMY     as a child   TOTAL HIP ARTHROPLASTY Left 2011   UPPER GASTROINTESTINAL ENDOSCOPY  01/04/2020   VENTRICULOPERITONEAL SHUNT  2007   x2    FAMILY HISTORY: Family History  Problem Relation Age of Onset   Other Mother    Lung cancer Father        alive, former smoker    Heart disease Brother        MI age 22   Other Brother        Murdered   Down syndrome Son    Diabetes Neg Hx    Prostate cancer Neg Hx    Colon cancer Neg Hx    Stomach cancer Neg Hx    Pancreatic cancer Neg Hx    Liver disease Neg Hx     SOCIAL HISTORY:  Social History   Socioeconomic History   Marital status: Married    Spouse name: [name]   Number of children: 2   Years of education: Not on file   Highest education level: 12th grade  Occupational History   Occupation: disabled   Tobacco Use   Smoking status: Never   Smokeless tobacco: Never  Vaping Use   Vaping status: Never Used  Substance and Sexual Activity   Alcohol use: No   Drug use: No   Sexual activity: Yes    Partners: Female  Other Topics Concern   Not on file  Social History Narrative   Household-- pt , wife, one adult son with Down's syndrome   younger son lives in [place]   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   Social Drivers of Corporate Investment Banker Strain: Low Risk  (02/10/2023)   Overall Financial Resource Strain (CARDIA)    Difficulty of Paying Living Expenses: Not hard at all  Food Insecurity: No Food Insecurity (02/10/2023)   Hunger Vital Sign    Worried About Running Out of Food in the Last Year: Never true    Ran Out of Food in the Last Year: Never true   Transportation Needs: No Transportation Needs (02/10/2023)   PRAPARE - Administrator, Civil Service (Medical): No    Lack of Transportation (Non-Medical): No  Physical Activity: Unknown (02/10/2023)   Exercise Vital Sign    Days of Exercise per Week: 0 days    Minutes of Exercise per Session: Not on file  Stress: No Stress Concern Present (02/10/2023)   [employer] of Occupational Health - Occupational Stress Questionnaire    Feeling of Stress : Only a little  Social Connections: Moderately Isolated (02/10/2023)   Social Connection and Isolation Panel [NHANES]    Frequency of Communication with Friends and Family: More than three times a week    Frequency of Social Gatherings with Friends and Family: Three times a week    Attends Religious Services: Never    Active Member of Clubs or Organizations: No    Attends Banker Meetings: Not  on file    Marital Status: Married  Catering Manager Violence: Not At Risk (03/25/2023)   Humiliation, Afraid, Rape, and Kick questionnaire    Fear of Current or Ex-Partner: No    Emotionally Abused: No    Physically Abused: No    Sexually Abused: No     PHYSICAL EXAM There were no vitals filed for this visit.   There is no height or weight on file to calculate BMI.     General: The patient is well-developed and well-nourished and in no acute distress, he has a VP shunt bulb on the left.      Neck: He has mild tenderness at the occiput, left > right  Good range of motion..  No weakness.   Skin: Extremities are without significant edema.  Psoriasis is well controlled.  Neurologic Exam  Mental status: The patient is alert and oriented x 3 at the time of the examination.  No ptosis.  The patient has apparent normal recent and remote memory, with an apparently normal attention span and concentration ability.   Speech is normal.  Cranial nerves: Extraocular movements are full.  Facial strength and sensation is normal.   Neck strength is normal.  Hearing is normal.  Motor: 5/5 strength  Coordination: Cerebellar testing shows good finger-nose-finger  Gait and station: Station is normal.  The gait is arthritic.  Tandem gait is wide.  Romberg is negative.     DIAGNOSTIC DATA (LABS, IMAGING, TESTING) - I reviewed patient records, labs, notes, testing and imaging myself where available.  Lab Results  Component Value Date   WBC 6.7 02/12/2023   HGB 15.0 02/12/2023   HCT 45.5 02/12/2023   MCV 86.3 02/12/2023   PLT 196 02/12/2023      Component Value Date/Time   NA 140 04/15/2023 0954   NA 143 07/06/2022 0000   K 4.3 04/15/2023 0954   CL 103 04/15/2023 0954   CO2 23 04/15/2023 0954   GLUCOSE 174 (H) 04/15/2023 0954   BUN 30 (H) 04/15/2023 0954   BUN 21 07/06/2022 0000   CREATININE 1.76 (H) 04/15/2023 0954   CALCIUM  9.6 04/15/2023 0954   PROT 8.4 (H) 02/12/2023 1356   ALBUMIN  4.6 02/12/2023 1356   AST 32 02/12/2023 1356   ALT 29 02/12/2023 1356   ALKPHOS 76 02/12/2023 1356   BILITOT 1.6 (H) 02/12/2023 1356   GFRNONAA 42 (L) 02/12/2023 1356   GFRAA 66 08/08/2020 1442   Lab Results  Component Value Date   CHOL 132 01/20/2023   HDL 41.00 01/20/2023   LDLCALC 68 01/30/2022   LDLDIRECT 59.0 01/20/2023   TRIG 240.0 (H) 01/20/2023   CHOLHDL 3 01/20/2023   Lab Results  Component Value Date   HGBA1C 7.3 (A) 02/11/2023   Lab Results  Component Value Date   VITAMINB12 490 06/10/2018   Lab Results  Component Value Date   TSH 1.22 06/26/2021       ASSESSMENT AND PLAN  Chronic migraine w/o aura, not intractable, w/o stat migr - Plan: botulinum toxin Type A  (BOTOX ) injection 155 Units  Neck pain  REM behavioral disorder  Obstructive hydrocephalus (HCC)  OSA -- dx ~ 2012, cpap intolerant  1.  Botox  155 units as follows: Frontalis muscle (5 units x 4), corrugators and procerus (5 units x 3), temporalis (5 units x 8), occipitalis (5 units x 4), splenius capitis (12.5 units x 2),  splenius cervicis (10 units x 2), C6-C7 paraspinal (5 units x 2), trapezius (12.5 units x  2).  45 units wasted.   2.    Continue clonazepam  nightly for REM behavior disorder and insomnia. 3.  He was unable to tolerate CPAP and does not wish to retry.   4.   rtc 6 months, sooner if new or worsening issues.  Kanen Mottola A. Vear, MD, [facility] 07/06/2023, 1:37 PM Certified in Neurology, Clinical Neurophysiology, Sleep Medicine, Pain Medicine and Neuroimaging  [facility] [address], [address] [place], [place] [address] [phone]

## 2023-07-06 NOTE — Progress Notes (Signed)
 Botox- 200 units x 1 vial Lot: [id number] Expiration:08/2025 NDC: 0023-3921-02  Bacteriostatic 0.9% Sodium Chloride- * mL  ZOX:[id number] Expiration: 04/29/2024 NDC:0409-1966-02  Dx:G43.709 S/P Witnessed by: Dr.Sater administered medication

## 2023-07-13 DIAGNOSIS — M5416 Radiculopathy, lumbar region: Secondary | ICD-10-CM | POA: Diagnosis not present

## 2023-07-13 NOTE — Telephone Encounter (Signed)
 LVM for patient to call and schedule follow up/kbl 07/13/23

## 2023-07-14 ENCOUNTER — Other Ambulatory Visit (HOSPITAL_BASED_OUTPATIENT_CLINIC_OR_DEPARTMENT_OTHER): Payer: Self-pay

## 2023-07-14 ENCOUNTER — Telehealth: Payer: Self-pay

## 2023-07-14 ENCOUNTER — Encounter: Payer: Self-pay | Admitting: Cardiology

## 2023-07-14 ENCOUNTER — Ambulatory Visit: Payer: Medicare Other | Admitting: Neurology

## 2023-07-14 DIAGNOSIS — Z0279 Encounter for issue of other medical certificate: Secondary | ICD-10-CM

## 2023-07-14 DIAGNOSIS — N1832 Chronic kidney disease, stage 3b: Secondary | ICD-10-CM | POA: Insufficient documentation

## 2023-07-14 HISTORY — DX: Chronic kidney disease, stage 3b: N18.32

## 2023-07-14 NOTE — Telephone Encounter (Signed)
 Form completed and given back to Pt during son's office visit. Copy sent for scanning

## 2023-07-16 ENCOUNTER — Ambulatory Visit: Payer: 59 | Attending: Cardiology | Admitting: Cardiology

## 2023-07-16 ENCOUNTER — Other Ambulatory Visit (HOSPITAL_BASED_OUTPATIENT_CLINIC_OR_DEPARTMENT_OTHER): Payer: Self-pay

## 2023-07-16 ENCOUNTER — Encounter: Payer: Self-pay | Admitting: Endocrinology

## 2023-07-16 ENCOUNTER — Encounter: Payer: Self-pay | Admitting: Cardiology

## 2023-07-16 ENCOUNTER — Ambulatory Visit (INDEPENDENT_AMBULATORY_CARE_PROVIDER_SITE_OTHER): Payer: 59 | Admitting: Endocrinology

## 2023-07-16 VITALS — BP 140/88 | HR 96 | Ht 71.0 in | Wt 234.0 lb

## 2023-07-16 VITALS — BP 122/70 | HR 70 | Resp 20 | Ht 71.0 in | Wt 232.2 lb

## 2023-07-16 DIAGNOSIS — E1165 Type 2 diabetes mellitus with hyperglycemia: Secondary | ICD-10-CM | POA: Diagnosis not present

## 2023-07-16 DIAGNOSIS — N1832 Chronic kidney disease, stage 3b: Secondary | ICD-10-CM | POA: Diagnosis not present

## 2023-07-16 DIAGNOSIS — I25118 Atherosclerotic heart disease of native coronary artery with other forms of angina pectoris: Secondary | ICD-10-CM

## 2023-07-16 DIAGNOSIS — I10 Essential (primary) hypertension: Secondary | ICD-10-CM

## 2023-07-16 DIAGNOSIS — Z794 Long term (current) use of insulin: Secondary | ICD-10-CM

## 2023-07-16 DIAGNOSIS — K7460 Unspecified cirrhosis of liver: Secondary | ICD-10-CM

## 2023-07-16 LAB — POCT GLYCOSYLATED HEMOGLOBIN (HGB A1C): Hemoglobin A1C: 7.9 % — AB (ref 4.0–5.6)

## 2023-07-16 MED ORDER — DEXCOM G6 TRANSMITTER MISC
1.0000 | 3 refills | Status: DC
Start: 2023-07-16 — End: 2024-02-08
  Filled 2023-07-16 – 2023-10-09 (×4): qty 1, 90d supply, fill #0

## 2023-07-16 MED ORDER — DEXCOM G6 SENSOR MISC
3 refills | Status: DC
  Filled 2023-07-16: qty 9, 90d supply, fill #0
  Filled 2023-11-16: qty 9, 90d supply, fill #1

## 2023-07-16 NOTE — Patient Instructions (Signed)
 Medication Instructions:  Your physician recommends that you continue on your current medications as directed. Please refer to the Current Medication list given to you today.  *If you need a refill on your cardiac medications before your next appointment, please call your pharmacy*   Lab Work: None Ordered If you have labs (blood work) drawn today and your tests are completely normal, you will receive your results only by: MyChart Message (if you have MyChart) OR A paper copy in the mail If you have any lab test that is abnormal or we need to change your treatment, we will call you to review the results.   Testing/Procedures: None Ordered   Follow-Up: At [facility], you and your health needs are our priority.  As part of our continuing mission to provide you with exceptional heart care, we have created designated Provider Care Teams.  These Care Teams include your primary Cardiologist (physician) and Advanced Practice Providers (APPs -  Physician Assistants and Nurse Practitioners) who all work together to provide you with the care you need, when you need it.  We recommend signing up for the patient portal called "MyChart".  Sign up information is provided on this After Visit Summary.  MyChart is used to connect with patients for Virtual Visits (Telemedicine).  Patients are able to view lab/test results, encounter notes, upcoming appointments, etc.  Non-urgent messages can be sent to your provider as well.   To learn more about what you can do with MyChart, go to [email or web address].    Your next appointment:   6 month follow up

## 2023-07-16 NOTE — Progress Notes (Signed)
Cardiology Office Note:    Date:  07/16/2023   ID:  James [name], DOB 02-28-1956, MRN [id number]  PCP:  Wanda Plump, MD  Cardiologist:  Gypsy Balsam, MD    Referring MD: Wanda Plump, MD   Chief Complaint  Patient presents with   Medication Management    He's d/c Plavix    History of Present Illness:    James [name] is a 68 y.o. male   with past medical history significant for coronary artery disease status post PTCA of the LAD in 2022, diabetes, essential hypertension, dyslipidemia, nonalcoholic cirrhosis of the liver.  He was admitted recently to the hospital on 15 August 2021 because of intermittent chest pain.  Cardiac catheterization has been performed cardiac catheterization showed unchanged lesion compared to prior cardiac catheterization he does have about 75% lesion on the circumflex artery.  He also got multiple residual 30 to 50% lesions.  Today to my office for follow-up, overall doing very well from cardiac standpoint reviewed.  There is no chest pain tightness squeezing pressure burning chest no palpitation dizziness swelling of lower extremities.  Past Medical History:  Diagnosis Date   Abnormal cardiac CT angiography    Acid reflux    Annual physical exam 04/08/2015   Arthritis    Atypical chest pain 06/13/2020   Blood transfusion without reported diagnosis    Body mass index (BMI) 35.0-35.9, adult 04/05/2019   Chronic fatigue 01/28/2015   Chronic headaches    on cymbalta   Chronic kidney disease, stage 3b (HCC) 07/14/2023   Chronic migraine w/o aura, not intractable, w/o stat migr 10/24/2018   Cirrhosis (HCC)    Colon polyps    Complication of anesthesia    problems waking up from anesthesia   Coronary artery disease 01/23/2019   Depression    on cymbalta   Diabetes mellitus with neuropathy (HCC)    Diabetes with neuropathy 04/25/2013   Diverticulitis 03/2013   Dyslipidemia 05/29/2019   Eczema    Elevated LFTs    Epidural lipomatosis  10/05/2018   Essential (primary) hypertension 04/05/2019   Essential hypertension 10/10/2019   Fatty liver    GERD (gastroesophageal reflux disease) 04/28/2011   H/O craniotomy 05/07/2015   Headache 04/28/2011   Hepatitis 10/2017   NASH cirrhosis   History of kidney stones    Hyperlipidemia    Hypersomnia with sleep apnea 01/28/2015   Hypertension    IDA (iron deficiency anemia) 01/24/2019   Idiopathic intracranial hypertension 01/14/2017   Insomnia 04/26/2013   Kidney stone    Liver cirrhosis secondary to NASH (nonalcoholic steatohepatitis) (HCC) 01/02/2016   Low back pain 04/05/2019   Lower back injury 08/14/2019   Morbid obesity (HCC)    Neuromuscular disorder (HCC)    neuropathy   Neuropathy    Nonalcoholic steatohepatitis 10/05/2018   Obstructive hydrocephalus (HCC) 01/28/2015   OSA -- dx ~ 2012, cpap intolerant 09/04/2014    dx ~ 2012, cpap intolerant    PCP NOTES >>> 04/08/2015   Post-op pain 03/19/2019   Post-traumatic hydrocephalus (HCC)    s/p shunts x 2 (first got infected )   Presence of cerebrospinal fluid drainage device 07/28/2011   Psoriasis    sees Dr Lenis Noon   Psoriatic arthritis (HCC)    REM behavioral disorder 01/14/2017   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   see's Dr.Ortmann   Severe obesity (BMI >= 40) (HCC) 01/28/2015   SI (sacroiliac) joint dysfunction 08/14/2019   Sigmoid  diverticulitis 04/25/2013   Sleep apnea    no CPAP      Spondylolisthesis, lumbar region 03/16/2019   Stomach ulcer    Testosterone deficiency 04/28/2011   VP (ventriculoperitoneal) shunt status 07/31/2020    Past Surgical History:  Procedure Laterality Date   AMPUTATION Left 12/27/2021   Procedure: AMPUTATION GREAT TOE;  Surgeon: Vivi Barrack, DPM;  Location: [facility] OR;  Service: Podiatry;  Laterality: Left;   BACK SURGERY  1980   BRAIN SURGERY     VP shunts placed in 2007   CHOLECYSTECTOMY N/A 08/25/2017   Procedure: LAPAROSCOPIC CHOLECYSTECTOMY  WITH INTRAOPERATIVE CHOLANGIOGRAM;  Surgeon: Griselda Miner, MD;  Location: [facility] OR;  Service: General;  Laterality: N/A;   COLONOSCOPY     CORONARY STENT INTERVENTION N/A 09/27/2020   Procedure: CORONARY STENT INTERVENTION;  Surgeon: Corky Crafts, MD;  Location: [facility] INVASIVE CV LAB;  Service: Cardiovascular;  Laterality: N/A;   CORONARY ULTRASOUND/IVUS N/A 09/27/2020   Procedure: Intravascular Ultrasound/IVUS;  Surgeon: Corky Crafts, MD;  Location: [facility] INVASIVE CV LAB;  Service: Cardiovascular;  Laterality: N/A;   JOINT REPLACEMENT     total hip   LEFT HEART CATH N/A 09/27/2020   Procedure: Left Heart Cath;  Surgeon: Corky Crafts, MD;  Location: [facility] INVASIVE CV LAB;  Service: Cardiovascular;  Laterality: N/A;   LEFT HEART CATH AND CORONARY ANGIOGRAPHY N/A 09/24/2020   Procedure: LEFT HEART CATH AND CORONARY ANGIOGRAPHY;  Surgeon: Corky Crafts, MD;  Location: [facility] INVASIVE CV LAB;  Service: Cardiovascular;  Laterality: N/A;   LEFT HEART CATH AND CORONARY ANGIOGRAPHY N/A 08/18/2021   Procedure: LEFT HEART CATH AND CORONARY ANGIOGRAPHY;  Surgeon: Lennette Bihari, MD;  Location: [facility] INVASIVE CV LAB;  Service: Cardiovascular;  Laterality: N/A;   LUMBAR FUSION  03/16/2019   SHOULDER SURGERY Left 2010   TEE WITHOUT CARDIOVERSION N/A 01/01/2022   Procedure: TRANSESOPHAGEAL ECHOCARDIOGRAM (TEE);  Surgeon: Thomasene Ripple, DO;  Location: [facility] ENDOSCOPY;  Service: Cardiovascular;  Laterality: N/A;   TOE SURGERY Left 2018   TONSILLECTOMY     as a child   TOTAL HIP ARTHROPLASTY Left 2011   UPPER GASTROINTESTINAL ENDOSCOPY  01/04/2020   VENTRICULOPERITONEAL SHUNT  2007   x2    Current Medications: Current Meds  Medication Sig   acetaminophen (TYLENOL) 325 MG tablet Take 2 tablets (650 mg total) by mouth every 4 (four) hours as needed for headache or mild pain.   adalimumab (HUMIRA, 2 PEN,) 40 MG/0.8ML AJKT pen Inject 40mg  (0.33ml) under the skin every other week. (Patient taking  differently: Inject 40 mg into the skin every 14 (fourteen) days.)   amLODipine (NORVASC) 5 MG tablet Take 1 tablet (5 mg total) by mouth daily.   aspirin 81 MG chewable tablet Chew 1 tablet (81 mg total) by mouth daily.   atorvastatin (LIPITOR) 80 MG tablet Take 1 tablet (80 mg total) by mouth at bedtime.   botulinum toxin Type A (BOTOX) 200 units injection Provider to inject 155 units into the muscles of the head and neck every 3 months. Discard remainder. (Patient taking differently: Inject 155 Units into the muscle every 3 (three) months. Provider to inject 155 units into the muscles of the head and neck every 3 months. Discard remainder.)   carvedilol (COREG) 12.5 MG tablet Take 1 tablet (12.5 mg total) by mouth 2 (two) times daily with a meal.   clobetasol (TEMOVATE) 0.05 % external solution Apply 1 application externally twice a day 14 days. (Patient taking  differently: Apply 1 Application topically 2 (two) times daily.)   clonazePAM (KLONOPIN) 0.5 MG tablet Take 1 tablet (0.5 mg total) by mouth at bedtime.   clopidogrel (PLAVIX) 75 MG tablet Take 1 tablet (75 mg total) by mouth daily.   dicyclomine (BENTYL) 20 MG tablet Take 1 tablet (20 mg total) by mouth 2 (two) times daily as needed for up to 5 days for spasms.   dicyclomine (BENTYL) 20 MG tablet Take 1 tablet (20 mg total) by mouth 2 (two) times daily as needed. (Patient taking differently: Take 20 mg by mouth 2 (two) times daily as needed for spasms.)   DULoxetine (CYMBALTA) 60 MG capsule Take 2 capsules (120 mg total) by mouth daily.   empagliflozin (JARDIANCE) 10 MG TABS tablet Take 1 tablet (10 mg total) by mouth daily with breakfast.   EPINEPHrine (EPIPEN 2-PAK) 0.3 mg/0.3 mL IJ SOAJ injection Inject 0.3 mLs (0.3 mg total) into the muscle once as needed for up to 1 dose for anaphylaxis.   fenofibrate micronized (LOFIBRA) 134 MG capsule Take 1 capsule (134 mg total) by mouth 3 (three) times a week.   gabapentin (NEURONTIN) 600 MG  tablet Take 1 tablet (600 mg total) by mouth 2 (two) times daily.   Insulin Disposable Pump (OMNIPOD 5 G6 PODS, GEN 5,) MISC Use as directed every 3 (three) days.   insulin lispro (HUMALOG) 100 UNIT/ML injection Use up to 60 Units daily in pump (Patient taking differently: Inject 60 Units into the skin See admin instructions. Use up to 60 Units daily in pump)   Insulin Syringe-Needle U-100 (INSULIN SYRINGE 1CC/31GX5/16") 31G X 5/16" 1 ML MISC Use to administer Humalog 3 times a day (Patient taking differently: 1 tablet by Other route See admin instructions. Use to administer Humalog 3 times a day)   isosorbide mononitrate (IMDUR) 120 MG 24 hr tablet Take 1 tablet (120 mg total) by mouth daily.   metFORMIN (GLUCOPHAGE) 500 MG tablet Take 1 tablet (500 mg total) by mouth 2 (two) times daily.   nitroGLYCERIN (NITROSTAT) 0.4 MG SL tablet Place 1 tablet (0.4 mg total) under the tongue every 5 (five) minutes for 3 doses as needed for chest pain.   ondansetron (ZOFRAN) 4 MG tablet Take 1 tablet (4 mg total) by mouth every 6 (six) hours as needed for nausea or vomiting.   ondansetron (ZOFRAN) 4 MG tablet Take 1 tablet (4 mg total) by mouth every 8 (eight) hours as needed. (Patient taking differently: Take 4 mg by mouth every 8 (eight) hours as needed for nausea or vomiting.)   pantoprazole (PROTONIX) 40 MG tablet Take 1 tablet (40 mg total) by mouth daily.   traMADol (ULTRAM) 50 MG tablet Take 1 tablet (50 mg total) by mouth every 6 (six) hours as needed for up to 10 doses for severe pain.   [DISCONTINUED] Continuous Glucose Sensor (DEXCOM G6 SENSOR) MISC Use to monitor blood sugar, change after 10 days (Patient taking differently: 1 each by Other route See admin instructions. Use to monitor blood sugar, change after 10 days)   [DISCONTINUED] Continuous Glucose Transmitter (DEXCOM G6 TRANSMITTER) MISC 1 Device by Does not apply route continuous.   Current Facility-Administered Medications for the 07/16/23  encounter (Office Visit) with Georgeanna Lea, MD  Medication   botulinum toxin Type A (BOTOX) injection 155 Units   botulinum toxin Type A (BOTOX) injection 155 Units     Allergies:   Bee venom, Hydrocodone bit-homatrop mbr, Toradol [ketorolac tromethamine], Prednisone, Sulfadiazine, Morphine and  codeine, and Sulfa drugs cross reactors   Social History   Socioeconomic History   Marital status: Married    Spouse name: Burna Mortimer   Number of children: 2   Years of education: Not on file   Highest education level: 12th grade  Occupational History   Occupation: disabled   Tobacco Use   Smoking status: Never   Smokeless tobacco: Never  Vaping Use   Vaping status: Never Used  Substance and Sexual Activity   Alcohol use: No   Drug use: No   Sexual activity: Yes    Partners: Female  Other Topics Concern   Not on file  Social History Narrative   Household-- pt , wife, one adult son with Down's syndrome   younger son lives in [place]   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   Social Drivers of Corporate investment banker Strain: Low Risk  (02/10/2023)   Overall Financial Resource Strain (CARDIA)    Difficulty of Paying Living Expenses: Not hard at all  Food Insecurity: No Food Insecurity (02/10/2023)   Hunger Vital Sign    Worried About Running Out of Food in the Last Year: Never true    Ran Out of Food in the Last Year: Never true  Transportation Needs: No Transportation Needs (02/10/2023)   PRAPARE - Administrator, Civil Service (Medical): No    Lack of Transportation (Non-Medical): No  Physical Activity: Unknown (02/10/2023)   Exercise Vital Sign    Days of Exercise per Week: 0 days    Minutes of Exercise per Session: Not on file  Stress: No Stress Concern Present (02/10/2023)   [employer] of Occupational Health - Occupational Stress Questionnaire    Feeling of Stress : Only a little  Social Connections: Moderately  Isolated (02/10/2023)   Social Connection and Isolation Panel [NHANES]    Frequency of Communication with Friends and Family: More than three times a week    Frequency of Social Gatherings with Friends and Family: Three times a week    Attends Religious Services: Never    Active Member of Clubs or Organizations: No    Attends Engineer, structural: Not on file    Marital Status: Married     Family History: The patient's family history includes Down syndrome in his son; Heart disease in his brother; Lung cancer in his father; Other in his brother and mother. There is no history of Diabetes, Prostate cancer, Colon cancer, Stomach cancer, Pancreatic cancer, or Liver disease. ROS:   Please see the history of present illness.    All 14 point review of systems negative except as described per history of present illness  EKGs/Labs/Other Studies Reviewed:         Recent Labs: 02/12/2023: ALT 29; Hemoglobin 15.0; Magnesium 1.6; Platelets 196 04/15/2023: BUN 30; Creatinine, Ser 1.76; Potassium 4.3; Sodium 140  Recent Lipid Panel    Component Value Date/Time   CHOL 132 01/20/2023 1031   CHOL 152 10/30/2020 0920   TRIG 240.0 (H) 01/20/2023 1031   HDL 41.00 01/20/2023 1031   HDL 39 (L) 10/30/2020 0920   CHOLHDL 3 01/20/2023 1031   VLDL 48.0 (H) 01/20/2023 1031   LDLCALC 68 01/30/2022 1007   LDLCALC 68 10/30/2020 0920   LDLDIRECT 59.0 01/20/2023 1031    Physical Exam:    VS:  BP (!) 140/88 (BP Location: Right Arm, Patient Position: Sitting)   Pulse 96   Ht 5\' 11"  (  1.803 m)   Wt 234 lb (106.1 kg)   SpO2 98%   BMI 32.64 kg/m     Wt Readings from Last 3 Encounters:  07/16/23 234 lb (106.1 kg)  07/16/23 232 lb 3.2 oz (105.3 kg)  04/29/23 236 lb (107 kg)     GEN:  Well nourished, well developed in no acute distress HEENT: Normal NECK: No JVD; No carotid bruits LYMPHATICS: No lymphadenopathy CARDIAC: RRR, no murmurs, no rubs, no gallops RESPIRATORY:  Clear to  auscultation without rales, wheezing or rhonchi  ABDOMEN: Soft, non-tender, non-distended MUSCULOSKELETAL:  No edema; No deformity  SKIN: Warm and dry LOWER EXTREMITIES: no swelling NEUROLOGIC:  Alert and oriented x 3 PSYCHIATRIC:  Normal affect   ASSESSMENT:    1. Coronary artery disease involving native coronary artery of native heart with other form of angina pectoris (HCC)   2. Essential (primary) hypertension   3. Hepatic cirrhosis, unspecified hepatic cirrhosis type, unspecified whether ascites present (HCC)   4. Chronic kidney disease, stage 3b (HCC)    PLAN:    In order of problems listed above:  Coronary disease stable he did stop Plavix because of multiple bruises and he does not want to take it anymore Essential hypertension blood pressure slightly elevated we will recheck before he get out of here.  I encouraged him to BL be more active which should help with the blood pressure as well as overall wellbeing. Dyslipidemia I did review K PN which show me total cholesterol 132 HDL 41 LDL 68 continue present management. Chronic kidney failure last creatinine 1.76 we will continue watching.   Medication Adjustments/Labs and Tests Ordered: Current medicines are reviewed at length with the patient today.  Concerns regarding medicines are outlined above.  No orders of the defined types were placed in this encounter.  Medication changes: No orders of the defined types were placed in this encounter.   Signed, Georgeanna Lea, MD, [facility] 07/16/2023 2:34 PM    [facility]

## 2023-07-16 NOTE — Progress Notes (Signed)
Outpatient Endocrinology Note [place] Lilo Wallington, MD  07/16/23  Patient's Name: James [name]    DOB: 1956-01-19    MRN: [id number]                                                    REASON OF VISIT: Follow up for type 2 diabetes mellitus  PCP: Wanda Plump, MD  HISTORY OF PRESENT ILLNESS:   James [name] is a 68 y.o. old male with with past medical history listed below, is here for follow up of type 2 diabetes mellitus.   Pertinent Diabetes History: He was diagnosed with type 2 diabetes mellitus around 2014.  He was initially treated with metformin and glimepiride.  With the worsening of blood sugar control multidose insulin regimen was started around 2017.  He has been on OmniPod insulin pump from November 2021.  Chronic Diabetes Complications : Retinopathy: no. Last ophthalmology exam was done on annually, reportedly. Nephropathy: CKD, no microalbuminuria. Peripheral neuropathy: yes, on gabapentin Coronary artery disease: no Stroke: no  Relevant comorbidities and cardiovascular risk factors: Obesity: yes Body mass index is 32.39 kg/m.  Hypertension: yes Hyperlipidemia. Yes, on a statin. He has been following with gastroenterology for liver cirrhosis secondary to NASH.  Current / Home Diabetic regimen includes: Metformin 500 mg 2 times a day. Jardiance 10 mg daily.  Insulin Pump setting:  OMNIPOD 5 insulin pump settings as follows with Dexcom G6. Basal MN- 0.1u/hour 6AM- 0.3  11AM- 0.5 7PM- 0.8  Bolus CHO Ratio (1unit:CHO) MN- 1:1  Correction/Sensitivity: MN- 1:50  Target:   Active insulin time:  4 hours  Prior diabetic medications: Amaryl was stopped June 2024. Januvia  CONTINUOUS GLUCOSE MONITORING SYSTEM (CGMS) / INSULIN PUMP INTERPRETATION:                         OmniPod 5 Pump & Sensor Download (Reviewed and summarized below.) Pump: Dexcom G6 and OmniPod 5 Dates: December to 2 June 13, 2023, 14 days   Average daily carbs entered:  28 Average total daily insulin:  27.2 units, Basal:16%, Bolus: 84%.   Automated mode/automated limited to 100%.  Manual mode 0%.  Automated activity 0%.  He has been bolusing carb 12, 0 - 3 times a day.  Some of the day he does not bolus at all, some of the day he bolus up to 3 times a day.   Glycemic data:   Currently not using Dexcom G6, he had failed transmitter and has not used for more than a month.  Hypoglycemia: Patient has no hypoglycemic episodes. Patient has hypoglycemia awareness.     Factors modifying glucose control: 1.  Diabetic diet assessment: Not eating much due to abdominal pain/diarrhea/acute gastroenteritis.  2.  Staying active or exercising: No formal exercise.  3.  Medication compliance: compliant most of the time.  Interval history  Pump data as reviewed above.  Patient reports he had transmitter not working and has not been using Dexcom for more than a month.  Hemoglobin A1c today 7.9% worsening.  He complains of numbness and tingling of the feet and taking gabapentin.  He reports compliance with Jardiance and metformin.  No other complaints today.  He has not been monitoring blood sugar.  No glucose data to review.   REVIEW OF  SYSTEMS As per history of present illness.   PAST MEDICAL HISTORY: Past Medical History:  Diagnosis Date   Abnormal cardiac CT angiography    Acid reflux    Annual physical exam 04/08/2015   Arthritis    Atypical chest pain 06/13/2020   Blood transfusion without reported diagnosis    Body mass index (BMI) 35.0-35.9, adult 04/05/2019   Chronic fatigue 01/28/2015   Chronic headaches    on cymbalta   Chronic kidney disease, stage 3b (HCC) 07/14/2023   Chronic migraine w/o aura, not intractable, w/o stat migr 10/24/2018   Cirrhosis (HCC)    Colon polyps    Complication of anesthesia    problems waking up from anesthesia   Coronary artery disease 01/23/2019   Depression    on cymbalta   Diabetes mellitus with neuropathy  (HCC)    Diabetes with neuropathy 04/25/2013   Diverticulitis 03/2013   Dyslipidemia 05/29/2019   Eczema    Elevated LFTs    Epidural lipomatosis 10/05/2018   Essential (primary) hypertension 04/05/2019   Essential hypertension 10/10/2019   Fatty liver    GERD (gastroesophageal reflux disease) 04/28/2011   H/O craniotomy 05/07/2015   Headache 04/28/2011   Hepatitis 10/2017   NASH cirrhosis   History of kidney stones    Hyperlipidemia    Hypersomnia with sleep apnea 01/28/2015   Hypertension    IDA (iron deficiency anemia) 01/24/2019   Idiopathic intracranial hypertension 01/14/2017   Insomnia 04/26/2013   Kidney stone    Liver cirrhosis secondary to NASH (nonalcoholic steatohepatitis) (HCC) 01/02/2016   Low back pain 04/05/2019   Lower back injury 08/14/2019   Morbid obesity (HCC)    Neuromuscular disorder (HCC)    neuropathy   Neuropathy    Nonalcoholic steatohepatitis 10/05/2018   Obstructive hydrocephalus (HCC) 01/28/2015   OSA -- dx ~ 2012, cpap intolerant 09/04/2014    dx ~ 2012, cpap intolerant    PCP NOTES >>> 04/08/2015   Post-op pain 03/19/2019   Post-traumatic hydrocephalus (HCC)    s/p shunts x 2 (first got infected )   Presence of cerebrospinal fluid drainage device 07/28/2011   Psoriasis    sees Dr Lenis Noon   Psoriatic arthritis (HCC)    REM behavioral disorder 01/14/2017   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   see's Dr.Ortmann   Severe obesity (BMI >= 40) (HCC) 01/28/2015   SI (sacroiliac) joint dysfunction 08/14/2019   Sigmoid diverticulitis 04/25/2013   Sleep apnea    no CPAP      Spondylolisthesis, lumbar region 03/16/2019   Stomach ulcer    Testosterone deficiency 04/28/2011   VP (ventriculoperitoneal) shunt status 07/31/2020    PAST SURGICAL HISTORY: Past Surgical History:  Procedure Laterality Date   AMPUTATION Left 12/27/2021   Procedure: AMPUTATION GREAT TOE;  Surgeon: Vivi Barrack, DPM;  Location: [facility] OR;  Service:  Podiatry;  Laterality: Left;   BACK SURGERY  1980   BRAIN SURGERY     VP shunts placed in 2007   CHOLECYSTECTOMY N/A 08/25/2017   Procedure: LAPAROSCOPIC CHOLECYSTECTOMY WITH INTRAOPERATIVE CHOLANGIOGRAM;  Surgeon: Griselda Miner, MD;  Location: [facility] OR;  Service: General;  Laterality: N/A;   COLONOSCOPY     CORONARY STENT INTERVENTION N/A 09/27/2020   Procedure: CORONARY STENT INTERVENTION;  Surgeon: Corky Crafts, MD;  Location: [facility] INVASIVE CV LAB;  Service: Cardiovascular;  Laterality: N/A;   CORONARY ULTRASOUND/IVUS N/A 09/27/2020   Procedure: Intravascular Ultrasound/IVUS;  Surgeon: Corky Crafts, MD;  Location: [facility] INVASIVE  CV LAB;  Service: Cardiovascular;  Laterality: N/A;   JOINT REPLACEMENT     total hip   LEFT HEART CATH N/A 09/27/2020   Procedure: Left Heart Cath;  Surgeon: Corky Crafts, MD;  Location: [facility] INVASIVE CV LAB;  Service: Cardiovascular;  Laterality: N/A;   LEFT HEART CATH AND CORONARY ANGIOGRAPHY N/A 09/24/2020   Procedure: LEFT HEART CATH AND CORONARY ANGIOGRAPHY;  Surgeon: Corky Crafts, MD;  Location: [facility] INVASIVE CV LAB;  Service: Cardiovascular;  Laterality: N/A;   LEFT HEART CATH AND CORONARY ANGIOGRAPHY N/A 08/18/2021   Procedure: LEFT HEART CATH AND CORONARY ANGIOGRAPHY;  Surgeon: Lennette Bihari, MD;  Location: [facility] INVASIVE CV LAB;  Service: Cardiovascular;  Laterality: N/A;   LUMBAR FUSION  03/16/2019   SHOULDER SURGERY Left 2010   TEE WITHOUT CARDIOVERSION N/A 01/01/2022   Procedure: TRANSESOPHAGEAL ECHOCARDIOGRAM (TEE);  Surgeon: Thomasene Ripple, DO;  Location: [facility] ENDOSCOPY;  Service: Cardiovascular;  Laterality: N/A;   TOE SURGERY Left 2018   TONSILLECTOMY     as a child   TOTAL HIP ARTHROPLASTY Left 2011   UPPER GASTROINTESTINAL ENDOSCOPY  01/04/2020   VENTRICULOPERITONEAL SHUNT  2007   x2    ALLERGIES: Allergies  Allergen Reactions   Bee Venom Anaphylaxis   Hydrocodone Bit-Homatrop Mbr Other (See Comments)    Hallucinations,  confusion, delirium Depressed feeling   Toradol [Ketorolac Tromethamine] Other (See Comments)    Hallucinations, confusion, delirium   Prednisone     Patient reports it causes cirrhosis to flare up   Sulfadiazine     NDC AYTK:[id number] NDC DDUK:[id number] NDC SEGB:[id number]   Morphine And Codeine Other (See Comments)    Hallucinations, back in the 80s. States has taken vicodin before w/o problems    Sulfa Drugs Cross Reactors Rash    FAMILY HISTORY:  Family History  Problem Relation Age of Onset   Other Mother    Lung cancer Father        alive, former smoker    Heart disease Brother        MI age 15   Other Brother        Murdered   Down syndrome Son    Diabetes Neg Hx    Prostate cancer Neg Hx    Colon cancer Neg Hx    Stomach cancer Neg Hx    Pancreatic cancer Neg Hx    Liver disease Neg Hx     SOCIAL HISTORY: Social History   Socioeconomic History   Marital status: Married    Spouse name: Burna Mortimer   Number of children: 2   Years of education: Not on file   Highest education level: 12th grade  Occupational History   Occupation: disabled   Tobacco Use   Smoking status: Never   Smokeless tobacco: Never  Vaping Use   Vaping status: Never Used  Substance and Sexual Activity   Alcohol use: No   Drug use: No   Sexual activity: Yes    Partners: Female  Other Topics Concern   Not on file  Social History Narrative   Household-- pt , wife, one adult son with Down's syndrome   younger son lives in [place]   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   Social Drivers of Health   Financial Resource Strain: Low Risk  (02/10/2023)   Overall Financial Resource Strain (CARDIA)    Difficulty of Paying Living Expenses: Not hard at all  Food Insecurity: No Food Insecurity (02/10/2023)  Hunger Vital Sign    Worried About Running Out of Food in the Last Year: Never true    Ran Out of Food in the Last Year: Never true   Transportation Needs: No Transportation Needs (02/10/2023)   PRAPARE - Administrator, Civil Service (Medical): No    Lack of Transportation (Non-Medical): No  Physical Activity: Unknown (02/10/2023)   Exercise Vital Sign    Days of Exercise per Week: 0 days    Minutes of Exercise per Session: Not on file  Stress: No Stress Concern Present (02/10/2023)   [employer] of Occupational Health - Occupational Stress Questionnaire    Feeling of Stress : Only a little  Social Connections: Moderately Isolated (02/10/2023)   Social Connection and Isolation Panel [NHANES]    Frequency of Communication with Friends and Family: More than three times a week    Frequency of Social Gatherings with Friends and Family: Three times a week    Attends Religious Services: Never    Active Member of Clubs or Organizations: No    Attends Engineer, structural: Not on file    Marital Status: Married    MEDICATIONS:  Current Outpatient Medications  Medication Sig Dispense Refill   acetaminophen (TYLENOL) 325 MG tablet Take 2 tablets (650 mg total) by mouth every 4 (four) hours as needed for headache or mild pain.     adalimumab (HUMIRA, 2 PEN,) 40 MG/0.8ML AJKT pen Inject 40mg  (0.40ml) under the skin every other week. (Patient taking differently: Inject 40 mg into the skin every 14 (fourteen) days.) 1.6 mL 5   amLODipine (NORVASC) 5 MG tablet Take 1 tablet (5 mg total) by mouth daily. 90 tablet 3   aspirin 81 MG chewable tablet Chew 1 tablet (81 mg total) by mouth daily.     atorvastatin (LIPITOR) 80 MG tablet Take 1 tablet (80 mg total) by mouth at bedtime. 90 tablet 1   botulinum toxin Type A (BOTOX) 200 units injection Provider to inject 155 units into the muscles of the head and neck every 3 months. Discard remainder. (Patient taking differently: Inject 155 Units into the muscle every 3 (three) months. Provider to inject 155 units into the muscles of the head and neck every 3 months.  Discard remainder.) 1 each 2   carvedilol (COREG) 12.5 MG tablet Take 1 tablet (12.5 mg total) by mouth 2 (two) times daily with a meal. 180 tablet 1   clobetasol (TEMOVATE) 0.05 % external solution Apply 1 application externally twice a day 14 days. (Patient taking differently: Apply 1 Application topically 2 (two) times daily.) 50 mL 1   clonazePAM (KLONOPIN) 0.5 MG tablet Take 1 tablet (0.5 mg total) by mouth at bedtime. 30 tablet 5   clopidogrel (PLAVIX) 75 MG tablet Take 1 tablet (75 mg total) by mouth daily. 90 tablet 3   dicyclomine (BENTYL) 20 MG tablet Take 1 tablet (20 mg total) by mouth 2 (two) times daily as needed. (Patient taking differently: Take 20 mg by mouth 2 (two) times daily as needed for spasms.) 10 tablet 0   DULoxetine (CYMBALTA) 60 MG capsule Take 2 capsules (120 mg total) by mouth daily. 180 capsule 1   empagliflozin (JARDIANCE) 10 MG TABS tablet Take 1 tablet (10 mg total) by mouth daily with breakfast. 30 tablet 3   EPINEPHrine (EPIPEN 2-PAK) 0.3 mg/0.3 mL IJ SOAJ injection Inject 0.3 mLs (0.3 mg total) into the muscle once as needed for up to 1 dose for anaphylaxis.  2 each 2   fenofibrate micronized (LOFIBRA) 134 MG capsule Take 1 capsule (134 mg total) by mouth 3 (three) times a week. 15 capsule 3   gabapentin (NEURONTIN) 600 MG tablet Take 1 tablet (600 mg total) by mouth 2 (two) times daily. 180 tablet 1   Insulin Disposable Pump (OMNIPOD 5 G6 PODS, GEN 5,) MISC Use as directed every 3 (three) days. 10 each 3   insulin lispro (HUMALOG) 100 UNIT/ML injection Use up to 60 Units daily in pump (Patient taking differently: Inject 60 Units into the skin See admin instructions. Use up to 60 Units daily in pump) 20 mL 2   Insulin Syringe-Needle U-100 (INSULIN SYRINGE 1CC/31GX5/16") 31G X 5/16" 1 ML MISC Use to administer Humalog 3 times a day (Patient taking differently: 1 tablet by Other route See admin instructions. Use to administer Humalog 3 times a day) 100 each 0    isosorbide mononitrate (IMDUR) 120 MG 24 hr tablet Take 1 tablet (120 mg total) by mouth daily. 90 tablet 3   metFORMIN (GLUCOPHAGE) 500 MG tablet Take 1 tablet (500 mg total) by mouth 2 (two) times daily. 180 tablet 3   nitroGLYCERIN (NITROSTAT) 0.4 MG SL tablet Place 1 tablet (0.4 mg total) under the tongue every 5 (five) minutes for 3 doses as needed for chest pain. 25 tablet 6   ondansetron (ZOFRAN) 4 MG tablet Take 1 tablet (4 mg total) by mouth every 6 (six) hours as needed for nausea or vomiting. 20 tablet 0   ondansetron (ZOFRAN) 4 MG tablet Take 1 tablet (4 mg total) by mouth every 8 (eight) hours as needed. (Patient taking differently: Take 4 mg by mouth every 8 (eight) hours as needed for nausea or vomiting.) 12 tablet 0   pantoprazole (PROTONIX) 40 MG tablet Take 1 tablet (40 mg total) by mouth daily. 90 tablet 1   traMADol (ULTRAM) 50 MG tablet Take 1 tablet (50 mg total) by mouth every 6 (six) hours as needed for up to 10 doses for severe pain. 10 tablet 0   Continuous Glucose Sensor (DEXCOM G6 SENSOR) MISC Use to monitor blood sugar, change after 10 days 9 each 3   Continuous Glucose Transmitter (DEXCOM G6 TRANSMITTER) MISC 1 Device by Does not apply route continuous. 1 each 3   dicyclomine (BENTYL) 20 MG tablet Take 1 tablet (20 mg total) by mouth 2 (two) times daily as needed for up to 5 days for spasms. 10 tablet 0   Current Facility-Administered Medications  Medication Dose Route Frequency Provider Last Rate Last Admin   botulinum toxin Type A (BOTOX) injection 155 Units  155 Units Intramuscular Once Sater, Richard A, MD       botulinum toxin Type A (BOTOX) injection 155 Units  155 Units Intramuscular Once Sater, Pearletha Furl, MD        PHYSICAL EXAM: Vitals:   07/16/23 1054  BP: 122/70  Pulse: 70  Resp: 20  SpO2: 98%  Weight: 232 lb 3.2 oz (105.3 kg)  Height: 5\' 11"  (1.803 m)   Body mass index is 32.39 kg/m.  Wt Readings from Last 3 Encounters:  07/16/23 232 lb 3.2 oz  (105.3 kg)  04/29/23 236 lb (107 kg)  04/15/23 236 lb 9.6 oz (107.3 kg)    General: Well developed, well nourished male in no apparent distress.  HEENT: AT/Deport, no external lesions.  Eyes: Conjunctiva clear and no icterus. Neck: Neck supple  Lungs: Respirations not labored Neurologic: Alert, oriented, normal speech Extremities /  Skin: Dry.   Psychiatric: Does not appear depressed or anxious  Diabetic Foot Exam - Simple   Simple Foot Form Diabetic Foot exam was performed with the following findings: Yes 07/16/2023 11:21 AM  Visual Inspection See comments: Yes Sensation Testing See comments: Yes Pulse Check See comments: Yes Comments Monofilament exam significantly diminished bilaterally. Left great toe partial amputation. Dry and cracked skin.     LABS Reviewed Lab Results  Component Value Date   HGBA1C 7.9 (A) 07/16/2023   HGBA1C 7.3 (A) 02/11/2023   HGBA1C 7.7 (H) 09/01/2022   Lab Results  Component Value Date   FRUCTOSAMINE 305 (H) 05/16/2021   FRUCTOSAMINE 342 (H) 12/31/2020   FRUCTOSAMINE 317 (H) 06/18/2020   Lab Results  Component Value Date   CHOL 132 01/20/2023   HDL 41.00 01/20/2023   LDLCALC 68 01/30/2022   LDLDIRECT 59.0 01/20/2023   TRIG 240.0 (H) 01/20/2023   CHOLHDL 3 01/20/2023   Lab Results  Component Value Date   MICRALBCREAT 0.6 01/20/2023   MICRALBCREAT 1.5 01/30/2022   Lab Results  Component Value Date   CREATININE 1.76 (H) 04/15/2023   Lab Results  Component Value Date   GFR 40.19 (L) 04/15/2023    ASSESSMENT / PLAN  1. Type 2 diabetes mellitus with hyperglycemia, with long-term current use of insulin (HCC)     Diabetes Mellitus type 2, complicated by peripheral neuropathy and CKD. - Diabetic status / severity: Uncontrolled.  Lab Results  Component Value Date   HGBA1C 7.9 (A) 07/16/2023    - Hemoglobin A1c goal <7%   He has not been monitoring blood sugar lately.  He had failed Dexcom transmitter he has not used for  more than a month.  No glucose data to review.  Advised to bolus for meals at least 3 times a day before eating.  - Medications: Low.  Plan: -Continue OmniPod 5 with Dexcom G6 on current setting.  No change in setting today.  Sent prescription for [facility] transmitter and sensor. -Advised patient to bolus for meals 10-15 carb based on the meal size and carbohydrate content.  Currently he has been using carb count of 12 or 13 and missing the meal bolus as well. -Advised to use the automated mode of the insulin pump. -Continue Jardiance 10 mg daily. -Continue metformin 500 mg 2 times a day.   - Home glucose testing: continue CGM and check blood glucose as needed.  - Discussed/ Gave Hypoglycemia treatment plan.  # Consult : not required at this time.   # Annual urine for microalbuminuria/ creatinine ratio, no microalbuminuria currently. Last  Lab Results  Component Value Date   MICRALBCREAT 0.6 01/20/2023    # Foot check nightly / neuropathy, continue gabapentin.  # Annual dilated diabetic eye exams.   - Diet: Make healthy diabetic food choices - Life style / activity / exercise: Discussed.  2. Blood pressure  -  BP Readings from Last 1 Encounters:  07/16/23 122/70    - Control is in target.  - No change in current plans.  3. Lipid status / Hyperlipidemia - Last  Lab Results  Component Value Date   LDLCALC 68 01/30/2022   - Continue atorvastatin 80 mg daily.  Managed by primary care provider. He is also on ? fenofibrate 3 times a week.  Diagnoses and all orders for this visit:  Type 2 diabetes mellitus with hyperglycemia, with long-term current use of insulin (HCC) -     POCT glycosylated hemoglobin (Hb A1C) -  Continuous Glucose Transmitter (DEXCOM G6 TRANSMITTER) MISC; 1 Device by Does not apply route continuous.  Other orders -     Continuous Glucose Sensor (DEXCOM G6 SENSOR) MISC; Use to monitor blood sugar, change after 10 days   DISPOSITION Follow up in  clinic in 3 months suggested.   All questions answered and patient verbalized understanding of the plan.  [place]ra[place] Shela Esses, MD [facility] Endocrinology [facility] [address] [place], [address] [place], [place] [address] Phone # [phone]  At least part of this note was generated using voice recognition software. Inadvertent word errors may have occurred, which were not recognized during the proofreading process.

## 2023-07-21 ENCOUNTER — Other Ambulatory Visit (HOSPITAL_COMMUNITY): Payer: Self-pay

## 2023-07-23 ENCOUNTER — Encounter (HOSPITAL_COMMUNITY): Payer: Self-pay

## 2023-07-23 ENCOUNTER — Other Ambulatory Visit (HOSPITAL_COMMUNITY): Payer: Self-pay

## 2023-07-24 ENCOUNTER — Other Ambulatory Visit (HOSPITAL_COMMUNITY): Payer: Self-pay

## 2023-07-26 ENCOUNTER — Other Ambulatory Visit: Payer: Self-pay

## 2023-07-26 NOTE — Progress Notes (Signed)
Specialty Pharmacy Refill Coordination Note  James [name] is a 68 y.o. male contacted today regarding refills of specialty medication(s) Adalimumab (Humira (2 Pen))   Patient requested Delivery   Delivery date: 07/27/23  Verified address: [address] [place] [address]   Medication will be filled on 07/26/23.   Patient requested delivery on Monday 1/27. Medication will ship 1/27 for delivery 1/28.

## 2023-07-30 ENCOUNTER — Other Ambulatory Visit: Payer: Self-pay

## 2023-08-02 ENCOUNTER — Other Ambulatory Visit: Payer: Self-pay

## 2023-08-02 ENCOUNTER — Ambulatory Visit (INDEPENDENT_AMBULATORY_CARE_PROVIDER_SITE_OTHER): Payer: PPO | Admitting: Internal Medicine

## 2023-08-02 ENCOUNTER — Other Ambulatory Visit: Payer: Self-pay | Admitting: Endocrinology

## 2023-08-02 ENCOUNTER — Encounter: Payer: Self-pay | Admitting: Internal Medicine

## 2023-08-02 ENCOUNTER — Other Ambulatory Visit (HOSPITAL_BASED_OUTPATIENT_CLINIC_OR_DEPARTMENT_OTHER): Payer: Self-pay

## 2023-08-02 VITALS — BP 116/72 | HR 75 | Temp 98.2°F | Resp 18 | Ht 71.0 in | Wt 230.2 lb

## 2023-08-02 DIAGNOSIS — E1140 Type 2 diabetes mellitus with diabetic neuropathy, unspecified: Secondary | ICD-10-CM

## 2023-08-02 DIAGNOSIS — I10 Essential (primary) hypertension: Secondary | ICD-10-CM | POA: Diagnosis not present

## 2023-08-02 DIAGNOSIS — E785 Hyperlipidemia, unspecified: Secondary | ICD-10-CM | POA: Diagnosis not present

## 2023-08-02 DIAGNOSIS — I25118 Atherosclerotic heart disease of native coronary artery with other forms of angina pectoris: Secondary | ICD-10-CM | POA: Diagnosis not present

## 2023-08-02 DIAGNOSIS — E782 Mixed hyperlipidemia: Secondary | ICD-10-CM | POA: Diagnosis not present

## 2023-08-02 DIAGNOSIS — Z0000 Encounter for general adult medical examination without abnormal findings: Secondary | ICD-10-CM | POA: Diagnosis not present

## 2023-08-02 DIAGNOSIS — N1832 Chronic kidney disease, stage 3b: Secondary | ICD-10-CM

## 2023-08-02 LAB — CBC WITH DIFFERENTIAL/PLATELET
Basophils Absolute: 0 10*3/uL (ref 0.0–0.1)
Basophils Relative: 0.3 % (ref 0.0–3.0)
Eosinophils Absolute: 0.1 10*3/uL (ref 0.0–0.7)
Eosinophils Relative: 1.6 % (ref 0.0–5.0)
HCT: 44.1 % (ref 39.0–52.0)
Hemoglobin: 14.2 g/dL (ref 13.0–17.0)
Lymphocytes Relative: 19.5 % (ref 12.0–46.0)
Lymphs Abs: 1.6 10*3/uL (ref 0.7–4.0)
MCHC: 32.1 g/dL (ref 30.0–36.0)
MCV: 87.8 fL (ref 78.0–100.0)
Monocytes Absolute: 0.7 10*3/uL (ref 0.1–1.0)
Monocytes Relative: 8.2 % (ref 3.0–12.0)
Neutro Abs: 5.6 10*3/uL (ref 1.4–7.7)
Neutrophils Relative %: 70.4 % (ref 43.0–77.0)
Platelets: 146 10*3/uL — ABNORMAL LOW (ref 150.0–400.0)
RBC: 5.03 Mil/uL (ref 4.22–5.81)
RDW: 17.5 % — ABNORMAL HIGH (ref 11.5–15.5)
WBC: 8 10*3/uL (ref 4.0–10.5)

## 2023-08-02 LAB — BASIC METABOLIC PANEL
BUN: 23 mg/dL (ref 6–23)
CO2: 23 meq/L (ref 19–32)
Calcium: 9 mg/dL (ref 8.4–10.5)
Chloride: 105 meq/L (ref 96–112)
Creatinine, Ser: 1.43 mg/dL (ref 0.40–1.50)
GFR: 51.45 mL/min — ABNORMAL LOW (ref >60.00)
Glucose, Bld: 186 mg/dL — ABNORMAL HIGH (ref 70–99)
Potassium: 4.5 meq/L (ref 3.5–5.1)
Sodium: 141 meq/L (ref 135–145)

## 2023-08-02 LAB — PSA: PSA: 0.65 ng/mL (ref 0.10–4.00)

## 2023-08-02 MED ORDER — EMPAGLIFLOZIN 10 MG PO TABS
10.0000 mg | ORAL_TABLET | Freq: Every day | ORAL | 3 refills | Status: DC
  Filled 2023-08-02: qty 90, 90d supply, fill #0

## 2023-08-02 NOTE — Assessment & Plan Note (Signed)
Here for CPX - Td 2023 - PNM 23: 2019;  PNM 20 : 11/2020 - Had a flu shot -- Rec Shingrix , RSV, covid vax (if not done since 02/2023)  --Colonoscopy 07-2013,  had a polyp.  C-scope 12-2019, Cscope 12/2022, next per Gi --Prostate cancer screening: No symptoms, no FH, check PSA. -Diet and exercise: Discussed with patient --Labs reviewed, will get a BMP CBC PSA - [facility]  POA: See AVS

## 2023-08-02 NOTE — Progress Notes (Signed)
Subjective:    Patient ID: James [name], male    DOB: Jul 13, 1955, 68 y.o.   MRN: [id number]  DOS:  08/02/2023 Type of visit - description: cpx  Here for CPX. Sees multiple other doctors, chart reviewed. Denies chest pain or difficulty breathing, Denies nausea vomiting.  No blood in the stools. No LUTS  Review of Systems  Other than above, a 14 point review of systems is negative     Past Medical History:  Diagnosis Date   Abnormal cardiac CT angiography    Acid reflux    Annual physical exam 04/08/2015   Arthritis    Atypical chest pain 06/13/2020   Blood transfusion without reported diagnosis    Body mass index (BMI) 35.0-35.9, adult 04/05/2019   Chronic fatigue 01/28/2015   Chronic headaches    on cymbalta   Chronic kidney disease, stage 3b (HCC) 07/14/2023   Chronic migraine w/o aura, not intractable, w/o stat migr 10/24/2018   Cirrhosis (HCC)    Colon polyps    Complication of anesthesia    problems waking up from anesthesia   Coronary artery disease 01/23/2019   Depression    on cymbalta   Diabetes mellitus with neuropathy (HCC)    Diabetes with neuropathy 04/25/2013   Diverticulitis 03/2013   Dyslipidemia 05/29/2019   Eczema    Elevated LFTs    Epidural lipomatosis 10/05/2018   Essential (primary) hypertension 04/05/2019   Essential hypertension 10/10/2019   Fatty liver    GERD (gastroesophageal reflux disease) 04/28/2011   H/O craniotomy 05/07/2015   Headache 04/28/2011   Hepatitis 10/2017   NASH cirrhosis   History of kidney stones    Hyperlipidemia    Hypersomnia with sleep apnea 01/28/2015   Hypertension    IDA (iron deficiency anemia) 01/24/2019   Idiopathic intracranial hypertension 01/14/2017   Insomnia 04/26/2013   Kidney stone    Liver cirrhosis secondary to NASH (nonalcoholic steatohepatitis) (HCC) 01/02/2016   Low back pain 04/05/2019   Lower back injury 08/14/2019   Morbid obesity (HCC)    Neuromuscular disorder (HCC)     neuropathy   Neuropathy    Nonalcoholic steatohepatitis 10/05/2018   Obstructive hydrocephalus (HCC) 01/28/2015   OSA -- dx ~ 2012, cpap intolerant 09/04/2014    dx ~ 2012, cpap intolerant    PCP NOTES >>> 04/08/2015   Post-op pain 03/19/2019   Post-traumatic hydrocephalus (HCC)    s/p shunts x 2 (first got infected )   Presence of cerebrospinal fluid drainage device 07/28/2011   Psoriasis    sees Dr Lenis Noon   Psoriatic arthritis (HCC)    REM behavioral disorder 01/14/2017   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   see's Dr.Ortmann   Severe obesity (BMI >= 40) (HCC) 01/28/2015   SI (sacroiliac) joint dysfunction 08/14/2019   Sigmoid diverticulitis 04/25/2013   Sleep apnea    no CPAP      Spondylolisthesis, lumbar region 03/16/2019   Stomach ulcer    Testosterone deficiency 04/28/2011   VP (ventriculoperitoneal) shunt status 07/31/2020    Past Surgical History:  Procedure Laterality Date   AMPUTATION Left 12/27/2021   Procedure: AMPUTATION GREAT TOE;  Surgeon: Vivi Barrack, DPM;  Location: [facility] OR;  Service: Podiatry;  Laterality: Left;   BACK SURGERY  1980   BRAIN SURGERY     VP shunts placed in 2007   CHOLECYSTECTOMY N/A 08/25/2017   Procedure: LAPAROSCOPIC CHOLECYSTECTOMY WITH INTRAOPERATIVE CHOLANGIOGRAM;  Surgeon: Griselda Miner, MD;  Location: [facility]  OR;  Service: General;  Laterality: N/A;   COLONOSCOPY     CORONARY STENT INTERVENTION N/A 09/27/2020   Procedure: CORONARY STENT INTERVENTION;  Surgeon: Corky Crafts, MD;  Location: [facility] INVASIVE CV LAB;  Service: Cardiovascular;  Laterality: N/A;   CORONARY ULTRASOUND/IVUS N/A 09/27/2020   Procedure: Intravascular Ultrasound/IVUS;  Surgeon: Corky Crafts, MD;  Location: [facility] INVASIVE CV LAB;  Service: Cardiovascular;  Laterality: N/A;   JOINT REPLACEMENT     total hip   LEFT HEART CATH N/A 09/27/2020   Procedure: Left Heart Cath;  Surgeon: Corky Crafts, MD;  Location: [facility] INVASIVE CV LAB;   Service: Cardiovascular;  Laterality: N/A;   LEFT HEART CATH AND CORONARY ANGIOGRAPHY N/A 09/24/2020   Procedure: LEFT HEART CATH AND CORONARY ANGIOGRAPHY;  Surgeon: Corky Crafts, MD;  Location: [facility] INVASIVE CV LAB;  Service: Cardiovascular;  Laterality: N/A;   LEFT HEART CATH AND CORONARY ANGIOGRAPHY N/A 08/18/2021   Procedure: LEFT HEART CATH AND CORONARY ANGIOGRAPHY;  Surgeon: Lennette Bihari, MD;  Location: [facility] INVASIVE CV LAB;  Service: Cardiovascular;  Laterality: N/A;   LUMBAR FUSION  03/16/2019   SHOULDER SURGERY Left 2010   TEE WITHOUT CARDIOVERSION N/A 01/01/2022   Procedure: TRANSESOPHAGEAL ECHOCARDIOGRAM (TEE);  Surgeon: Thomasene Ripple, DO;  Location: [facility] ENDOSCOPY;  Service: Cardiovascular;  Laterality: N/A;   TOE SURGERY Left 2018   TONSILLECTOMY     as a child   TOTAL HIP ARTHROPLASTY Left 2011   UPPER GASTROINTESTINAL ENDOSCOPY  01/04/2020   VENTRICULOPERITONEAL SHUNT  2007   x2   Social History   Socioeconomic History   Marital status: Married    Spouse name: Burna Mortimer   Number of children: 2   Years of education: Not on file   Highest education level: 12th grade  Occupational History   Occupation: disabled   Tobacco Use   Smoking status: Never   Smokeless tobacco: Never  Vaping Use   Vaping status: Never Used  Substance and Sexual Activity   Alcohol use: No   Drug use: No   Sexual activity: Yes    Partners: Female  Other Topics Concern   Not on file  Social History Narrative   Household-- pt , wife, one adult son with Down's syndrome   younger son lives in [place]   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   Social Drivers of Corporate investment banker Strain: Low Risk  (02/10/2023)   Overall Financial Resource Strain (CARDIA)    Difficulty of Paying Living Expenses: Not hard at all  Food Insecurity: No Food Insecurity (02/10/2023)   Hunger Vital Sign    Worried About Running Out of Food in the Last Year: Never true     Ran Out of Food in the Last Year: Never true  Transportation Needs: No Transportation Needs (02/10/2023)   PRAPARE - Administrator, Civil Service (Medical): No    Lack of Transportation (Non-Medical): No  Physical Activity: Unknown (02/10/2023)   Exercise Vital Sign    Days of Exercise per Week: 0 days    Minutes of Exercise per Session: Not on file  Stress: No Stress Concern Present (02/10/2023)   [employer] of Occupational Health - Occupational Stress Questionnaire    Feeling of Stress : Only a little  Social Connections: Moderately Isolated (02/10/2023)   Social Connection and Isolation Panel [NHANES]    Frequency of Communication with Friends and Family: More than three times a week  Frequency of Social Gatherings with Friends and Family: Three times a week    Attends Religious Services: Never    Active Member of Clubs or Organizations: No    Attends Banker Meetings: Not on file    Marital Status: Married  Intimate Partner Violence: Not At Risk (03/25/2023)   Humiliation, Afraid, Rape, and Kick questionnaire    Fear of Current or Ex-Partner: No    Emotionally Abused: No    Physically Abused: No    Sexually Abused: No     Current Outpatient Medications  Medication Instructions   acetaminophen (TYLENOL) 650 mg, Oral, Every 4 hours PRN   adalimumab (HUMIRA, 2 PEN,) 40 MG/0.8ML AJKT pen Inject 40mg  (0.88ml) under the skin every other week.   amLODipine (NORVASC) 5 mg, Oral, Daily   aspirin 81 mg, Oral, Daily   atorvastatin (LIPITOR) 80 mg, Oral, Daily at bedtime   botulinum toxin Type A (BOTOX) 200 units injection Provider to inject 155 units into the muscles of the head and neck every 3 months. Discard remainder.   carvedilol (COREG) 12.5 mg, Oral, 2 times daily with meals   clobetasol (TEMOVATE) 0.05 % external solution Apply 1 application externally twice a day 14 days.   clonazePAM (KLONOPIN) 0.5 mg, Oral, Daily at bedtime   Continuous Glucose  Sensor (DEXCOM G6 SENSOR) MISC Use to monitor blood sugar, change after 10 days   Continuous Glucose Transmitter (DEXCOM G6 TRANSMITTER) MISC 1 Device, Does not apply, Continuous   dicyclomine (BENTYL) 20 mg, Oral, 2 times daily PRN   DULoxetine (CYMBALTA) 120 mg, Oral, Daily   EPINEPHrine (EPIPEN 2-PAK) 0.3 mg, Intramuscular, Once PRN   fenofibrate micronized (LOFIBRA) 134 mg, Oral, 3 times weekly   gabapentin (NEURONTIN) 600 mg, Oral, 2 times daily   Insulin Disposable Pump (OMNIPOD 5 G6 PODS, GEN 5,) MISC Use as directed every 3 (three) days.   insulin lispro (HUMALOG) 100 UNIT/ML injection Use up to 60 Units daily in pump   Insulin Syringe-Needle U-100 (INSULIN SYRINGE 1CC/31GX5/16") 31G X 5/16" 1 ML MISC Use to administer Humalog 3 times a day   isosorbide mononitrate (IMDUR) 120 mg, Oral, Daily   Jardiance 10 mg, Oral, Daily with breakfast   metFORMIN (GLUCOPHAGE) 500 mg, Oral, 2 times daily   nitroGLYCERIN (NITROSTAT) 0.4 MG SL tablet Place 1 tablet (0.4 mg total) under the tongue every 5 (five) minutes for 3 doses as needed for chest pain.   ondansetron (ZOFRAN) 4 mg, Oral, Every 8 hours PRN   pantoprazole (PROTONIX) 40 mg, Oral, Daily   traMADol (ULTRAM) 50 mg, Oral, Every 6 hours PRN       Objective:   Physical Exam BP 116/72   Pulse 75   Temp 98.2 F (36.8 C) (Oral)   Resp 18   Ht 5\' 11"  (1.803 m)   Wt 230 lb 4 oz (104.4 kg)   SpO2 94%   BMI 32.11 kg/m  General: Well developed, NAD, BMI noted Neck: No  thyromegaly  HEENT:  Normocephalic . Face symmetric, atraumatic Lungs:  CTA B Normal respiratory effort, no intercostal retractions, no accessory muscle use. Heart: RRR,  no murmur.  Abdomen:  Not distended, soft, non-tender. No rebound or rigidity.   Lower extremities: no pretibial edema bilaterally  Skin: Exposed areas without rash. Not pale. Not jaundice Neurologic:  alert & oriented X3.  Speech normal, gait appropriate for age and unassisted Strength  symmetric and appropriate for age.  Psych: Cognition and judgment appear intact.  Cooperative with normal attention span and concentration.  Behavior appropriate. No anxious or depressed appearing.     Assessment    Problem list: DM -Per Endo Neuropathy (x years, rx gaba 05-2014, w/u 11-2014  RPR neg, vit D-B12-Folic Acid wnl ); Saw Dr Allena Katz, NCS [phone] (see results) HTN: History of AKI with ARBs  CKD, used to see  nephrology, etiology- Hyperglycemia, intermittent NSAIDs, contrast exposures Hyperlipidemia (TG in the 500s 2016) OSA , dx 2012,  again 02-2015 Dr Dohmeier--> severe OSA, intolerant to CPAP, not interested on treatment (see OV 12-2022) Depression, insomnia: on Cymbalta NEURO: --Chronic headaches :on Cymbalta  --Posttraumatic hydrocephalus s/p 2 shunts (first got infected) MSK: on disability d/t back pain- HAs GI:  --GERD, diverticulitis 2014, h/o PUD -- NASH with cirrhosis per GI note 10/2017, s/p Hep A/B shots --Anemia: - felt to be d/t   GAVE (gastric antral vascular ectasia) and a inflammatory polyp, s/p  EGD 10/2018    -Work-up repeated 10/2019: EGD: GAVE  versus portal hypertensive gastropathy. Colonoscopy polyps.  Tubular adenoma. Gastric BX negative H. pylori, reactive changes. Psoriasis, psoriatic arthritis: used  HUMIRA  CAD: CP, PTCA of the LAD 09-2020; cath 07-2021 H/o urolithiasis Hypogonadism  Dx 2012, normal T 11-2014 (on no RX)  PLAN Here for CPX - Td 2023 - PNM 23: 2019;  PNM 20 : 11/2020 - Had a flu shot -- Rec Shingrix , RSV, covid vax (if not done since 02/2023)  --Colonoscopy 07-2013,  had a polyp.  C-scope 12-2019, Cscope 12/2022, next per Gi --Prostate cancer screening: No symptoms, no FH, check PSA. -Diet and exercise: Discussed with patient --Labs reviewed, will get a BMP CBC PSA - Healthcare  POA: See AVS Other  issues discussed: DM: Per Endo, HTN: BP is very good, recommend to check at home , Continue amlodipine, carvedilol, Imdur. CAD: Saw  cardiology 07/16/2023, patient stopped Plavix due to multiple bruises.  On aspirin.  Denies chest pain.  Check CBC High cholesterol, last LDL 68, no change. CKD: Checking a BMP, encouraged good hydration, avoid NSAIDs - other than aspirin Dysphonia: Saw ENT 04/29/2023, had a laryngoscopy, Dx with laryngospasm, vocal fold atrophy, muscle tension dysphonia. Migraines: Per  neuro, had Botox injections September 24 RTC 6 months

## 2023-08-02 NOTE — Patient Instructions (Addendum)
Please consider the following vaccines: Shingrix RSV COVID-vaccine (if not done after September 2024.   Check the  blood pressure regularly Blood pressure goal:  between 110/65 and  135/85. If it is consistently higher or lower, let me know     GO TO THE LAB : Get the blood work     Next visit with me in 6 months     Please schedule it at the front desk      Per our records you are due for your diabetic eye exam. Please contact your eye doctor to schedule an appointment. Please have them send copies of your office visit notes to [place]. Our fax number is [phone]. If you need a referral to an eye doctor please let us know.     "Health Care Power of attorney" ,  "Living will" (Advance care planning documents)  If you already have a living will or healthcare power of attorney, is recommended you bring the copy to be scanned in your chart.   The document will be available to all the doctors you see in the system.  Advance care planning is a process that supports adults in  understanding and sharing their preferences regarding future medical care.  The patient's preferences are recorded in documents called Advance Directives and the can be modified at any time while the patient is in full mental capacity.   If you don't have one, please consider create one.      More information at: [email or web address]

## 2023-08-02 NOTE — Assessment & Plan Note (Signed)
Here for CPX Other  issues discussed: DM: Per Endo, HTN: BP is very good, recommend to check at home , Continue amlodipine, carvedilol, Imdur. CAD: Saw cardiology 07/16/2023, patient stopped Plavix due to multiple bruises.  On aspirin.  Denies chest pain.  Check CBC High cholesterol, last LDL 68, no change. CKD: Checking a BMP, encouraged good hydration, avoid NSAIDs - other than aspirin Dysphonia: Saw ENT 04/29/2023, had a laryngoscopy, Dx with laryngospasm, vocal fold atrophy, muscle tension dysphonia. Migraines: Per  neuro, had Botox injections September 24 RTC 6 months

## 2023-08-03 ENCOUNTER — Encounter: Payer: Self-pay | Admitting: Internal Medicine

## 2023-08-04 ENCOUNTER — Other Ambulatory Visit (HOSPITAL_BASED_OUTPATIENT_CLINIC_OR_DEPARTMENT_OTHER): Payer: Self-pay

## 2023-08-05 ENCOUNTER — Encounter (INDEPENDENT_AMBULATORY_CARE_PROVIDER_SITE_OTHER): Payer: Self-pay

## 2023-08-11 ENCOUNTER — Encounter: Payer: Self-pay | Admitting: Neurology

## 2023-08-11 ENCOUNTER — Ambulatory Visit: Payer: PPO | Admitting: Neurology

## 2023-08-11 VITALS — BP 128/86 | HR 78 | Ht 71.0 in | Wt 228.0 lb

## 2023-08-11 DIAGNOSIS — G4752 REM sleep behavior disorder: Secondary | ICD-10-CM | POA: Diagnosis not present

## 2023-08-11 DIAGNOSIS — G911 Obstructive hydrocephalus: Secondary | ICD-10-CM

## 2023-08-11 DIAGNOSIS — G43709 Chronic migraine without aura, not intractable, without status migrainosus: Secondary | ICD-10-CM

## 2023-08-11 DIAGNOSIS — G4733 Obstructive sleep apnea (adult) (pediatric): Secondary | ICD-10-CM | POA: Diagnosis not present

## 2023-08-11 NOTE — Progress Notes (Signed)
[facility]  PATIENT: James [name] DOB: [birth year]  REFERRING DOCTOR OR PCP:  Willow Ora  SOURCE: patient, notes from Dr. Drue Novel, imaging results and MRI and CT scans on PACS personally reviewed  _________________________________   HISTORICAL  CHIEF COMPLAINT:  Chief Complaint  Patient presents with   Migraine    Rm 10 alone Pt is well and stable, doing well with botox. Reports no concerns     HISTORY OF PRESENT ILLNESS:  James [name] is a 68 y.o. man with idiopathic intracranial hypertension and recent increase in the frequency and severity of headache  Update 08/11/2023:  He has OSA but could not tolerate CPAP.    He is not interested in re-trying or using an oral appliance or the Inspire device.  He is losing some weight -- once being 270 and nw 225 pounds.     He reports the REM behavior disorder is doing well on nighttime clonazepam  . He has had no active dreams since starting it .   No other signs of a synucleinopathy.   It also helps his insomnia  He has a VP shunt for idiopathic intracranial hypertension, since 2017.  He reports that his headaches are doing well.  No current pain.  He generally gets 10 to 12 weeks of benefit after each series of Botox.   When a a migraine occurs it is bilateral most of the time and occipital and frontal located.   When the headaches more severe he has photophobia and phonophobia. Sometimes, headaches improve when he is laying down but other times they do not change between sitting and lying down. Moving usually worsens the headaches.   He did not get a benefit from multiple prophylactic medications including Keppra, gabapentin, Emgality, Cymbalta.    Other medical issues:   In early 2022, he had 3 cardiac stents after being diagnosed with CAD after presenting with chest pain.   He had a small MI.    He is also on Humira for psoriatis.  He has MASH related cirrhosis.   Chronic Migraine History: He was having daily HA  with migrainous features.   He did not get a benefit from multiple prophylactic medications including Keppra, gabapentin, Emgality, Cymbalta.   Botox therapy started July 2021.  Spine: L-spine MRI 08/15/18 showed progressive epidural lipomatosis in the lower lumbar spine with progressive compression of the thecal sac and spinal stenosis at L3-4, L4-5, and L5-S1 compared to the prior study. Progressive facet degeneration and anterolisthesis at L4-5 since 2011.    MRI of the cervical spine performed 04/27/2017 showed  thoracic fusion hardware from C7 and below. There is mild spondylosis and disc bulging at C3-C4 through C6-C7. There does not appear to be any significant foraminal narrowing and there is no spinal stenosis.   Idiopthic intracranial Hypertension: He was diagnosed with idiopathic intracranial hypertension many years ago and had a VP shunt placed in 2007. A revision was performed July 2007 due to infection. He has a programmable Medtronic valve. Due to the increased headaches, it was reprogrammed from 1.5-1.0. Tapping of the shunt showed a pressure 160 (was drained to 140 mm). There was no infection. He also had an MRI of the brain and MR venogram. CT had shown the left-sided ventricular catheter extends into the right caudate head, similar to the previous study the MR venogram (11/12/2016) was reportedly normal. The left transverse and sigmoid sinuses are not well evaluated due to the shunt reservoir (but also likely he  is right dominant).   CT head 3/13 2020 personally reviewed and no evidence of shunt failure        REVIEW OF SYSTEMS: Constitutional: No fevers, chills, sweats, or change in appetite Eyes: No visual changes, double vision, eye pain Ear, nose and throat: No hearing loss, ear pain, nasal congestion, sore throat Cardiovascular: No chest pain, palpitations Respiratory:  No shortness of breath at rest or with exertion.   No wheezes GastrointestinaI: No nausea, vomiting,  diarrhea, abdominal pain, fecal incontinence Genitourinary:  No dysuria, urinary retention or frequency.  No nocturia. Musculoskeletal:  as above Integumentary: psoriasis on Humira Neurological: as above Psychiatric: No depression at this time.  No anxiety Endocrine: No palpitations, diaphoresis, change in appetite, change in weigh or increased thirst Hematologic/Lymphatic:  No anemia, purpura, petechiae. Allergic/Immunologic: No itchy/runny eyes, nasal congestion, recent allergic reactions, rashes  ALLERGIES: Allergies  Allergen Reactions   Bee Venom Anaphylaxis   Hydrocodone Bit-Homatrop Mbr Other (See Comments)    Hallucinations, confusion, delirium Depressed feeling   Toradol [Ketorolac Tromethamine] Other (See Comments)    Hallucinations, confusion, delirium   Prednisone     Patient reports it causes cirrhosis to flare up   Sulfadiazine     NDC VHQI:[id number] NDC KGMW:[id number] NDC IHKV:[id number]   Morphine And Codeine Other (See Comments)    Hallucinations, back in the 80s. States has taken vicodin before w/o problems    Sulfa Drugs Cross Reactors Rash    HOME MEDICATIONS:  Current Outpatient Medications:    acetaminophen (TYLENOL) 325 MG tablet, Take 2 tablets (650 mg total) by mouth every 4 (four) hours as needed for headache or mild pain., Disp: , Rfl:    adalimumab (HUMIRA, 2 PEN,) 40 MG/0.8ML AJKT pen, Inject 40mg  (0.66ml) under the skin every other week. (Patient taking differently: Inject 40 mg into the skin every 14 (fourteen) days.), Disp: 1.6 mL, Rfl: 5   amLODipine (NORVASC) 5 MG tablet, Take 1 tablet (5 mg total) by mouth daily., Disp: 90 tablet, Rfl: 3   aspirin 81 MG chewable tablet, Chew 1 tablet (81 mg total) by mouth daily., Disp: , Rfl:    atorvastatin (LIPITOR) 80 MG tablet, Take 1 tablet (80 mg total) by mouth at bedtime., Disp: 90 tablet, Rfl: 1   botulinum toxin Type A (BOTOX) 200 units injection, Provider to inject 155 units into the muscles of  the head and neck every 3 months. Discard remainder. (Patient taking differently: Inject 155 Units into the muscle every 3 (three) months. Provider to inject 155 units into the muscles of the head and neck every 3 months. Discard remainder.), Disp: 1 each, Rfl: 2   carvedilol (COREG) 12.5 MG tablet, Take 1 tablet (12.5 mg total) by mouth 2 (two) times daily with a meal., Disp: 180 tablet, Rfl: 1   clobetasol (TEMOVATE) 0.05 % external solution, Apply 1 application externally twice a day 14 days. (Patient taking differently: Apply 1 Application topically 2 (two) times daily.), Disp: 50 mL, Rfl: 1   clonazePAM (KLONOPIN) 0.5 MG tablet, Take 1 tablet (0.5 mg total) by mouth at bedtime., Disp: 30 tablet, Rfl: 5   Continuous Glucose Sensor (DEXCOM G6 SENSOR) MISC, Use to monitor blood sugar, change after 10 days, Disp: 9 each, Rfl: 3   Continuous Glucose Transmitter (DEXCOM G6 TRANSMITTER) MISC, 1 Device by Does not apply route continuous., Disp: 1 each, Rfl: 3   dicyclomine (BENTYL) 20 MG tablet, Take 1 tablet (20 mg total) by mouth 2 (two) times daily as needed. (  Patient taking differently: Take 20 mg by mouth 2 (two) times daily as needed for spasms.), Disp: 10 tablet, Rfl: 0   DULoxetine (CYMBALTA) 60 MG capsule, Take 2 capsules (120 mg total) by mouth daily., Disp: 180 capsule, Rfl: 1   empagliflozin (JARDIANCE) 10 MG TABS tablet, Take 1 tablet (10 mg total) by mouth daily with breakfast., Disp: 90 tablet, Rfl: 3   EPINEPHrine (EPIPEN 2-PAK) 0.3 mg/0.3 mL IJ SOAJ injection, Inject 0.3 mLs (0.3 mg total) into the muscle once as needed for up to 1 dose for anaphylaxis., Disp: 2 each, Rfl: 2   fenofibrate micronized (LOFIBRA) 134 MG capsule, Take 1 capsule (134 mg total) by mouth 3 (three) times a week., Disp: 15 capsule, Rfl: 3   gabapentin (NEURONTIN) 600 MG tablet, Take 1 tablet (600 mg total) by mouth 2 (two) times daily., Disp: 180 tablet, Rfl: 1   Insulin Disposable Pump (OMNIPOD 5 G6 PODS, GEN 5,)  MISC, Use as directed every 3 (three) days., Disp: 10 each, Rfl: 3   insulin lispro (HUMALOG) 100 UNIT/ML injection, Use up to 60 Units daily in pump (Patient taking differently: Inject 60 Units into the skin See admin instructions. Use up to 60 Units daily in pump), Disp: 20 mL, Rfl: 2   Insulin Syringe-Needle U-100 (INSULIN SYRINGE 1CC/31GX5/16") 31G X 5/16" 1 ML MISC, Use to administer Humalog 3 times a day (Patient taking differently: 1 tablet by Other route See admin instructions. Use to administer Humalog 3 times a day), Disp: 100 each, Rfl: 0   isosorbide mononitrate (IMDUR) 120 MG 24 hr tablet, Take 1 tablet (120 mg total) by mouth daily., Disp: 90 tablet, Rfl: 3   metFORMIN (GLUCOPHAGE) 500 MG tablet, Take 1 tablet (500 mg total) by mouth 2 (two) times daily., Disp: 180 tablet, Rfl: 3   nitroGLYCERIN (NITROSTAT) 0.4 MG SL tablet, Place 1 tablet (0.4 mg total) under the tongue every 5 (five) minutes for 3 doses as needed for chest pain., Disp: 25 tablet, Rfl: 6   ondansetron (ZOFRAN) 4 MG tablet, Take 1 tablet (4 mg total) by mouth every 8 (eight) hours as needed. (Patient taking differently: Take 4 mg by mouth every 8 (eight) hours as needed for nausea or vomiting.), Disp: 12 tablet, Rfl: 0   pantoprazole (PROTONIX) 40 MG tablet, Take 1 tablet (40 mg total) by mouth daily., Disp: 90 tablet, Rfl: 1   traMADol (ULTRAM) 50 MG tablet, Take 1 tablet (50 mg total) by mouth every 6 (six) hours as needed for up to 10 doses for severe pain., Disp: 10 tablet, Rfl: 0  Current Facility-Administered Medications:    botulinum toxin Type A (BOTOX) injection 155 Units, 155 Units, Intramuscular, Once, Kongmeng Santoro A, MD   botulinum toxin Type A (BOTOX) injection 155 Units, 155 Units, Intramuscular, Once, Rachna Schonberger, Pearletha Furl, MD  PAST MEDICAL HISTORY: Past Medical History:  Diagnosis Date   Abnormal cardiac CT angiography    Acid reflux    Annual physical exam 04/08/2015   Arthritis    Atypical chest pain  06/13/2020   Blood transfusion without reported diagnosis    Body mass index (BMI) 35.0-35.9, adult 04/05/2019   Chronic fatigue 01/28/2015   Chronic headaches    on cymbalta   Chronic kidney disease, stage 3b (HCC) 07/14/2023   Chronic migraine w/o aura, not intractable, w/o stat migr 10/24/2018   Cirrhosis (HCC)    Colon polyps    Complication of anesthesia    problems waking up from anesthesia  Coronary artery disease 01/23/2019   Depression    on cymbalta   Diabetes mellitus with neuropathy (HCC)    Diabetes with neuropathy 04/25/2013   Diverticulitis 03/2013   Dyslipidemia 05/29/2019   Eczema    Elevated LFTs    Epidural lipomatosis 10/05/2018   Essential (primary) hypertension 04/05/2019   Essential hypertension 10/10/2019   Fatty liver    GERD (gastroesophageal reflux disease) 04/28/2011   H/O craniotomy 05/07/2015   Headache 04/28/2011   Hepatitis 10/2017   NASH cirrhosis   History of kidney stones    Hyperlipidemia    Hypersomnia with sleep apnea 01/28/2015   Hypertension    IDA (iron deficiency anemia) 01/24/2019   Idiopathic intracranial hypertension 01/14/2017   Insomnia 04/26/2013   Kidney stone    Liver cirrhosis secondary to NASH (nonalcoholic steatohepatitis) (HCC) 01/02/2016   Low back pain 04/05/2019   Lower back injury 08/14/2019   Morbid obesity (HCC)    Neuromuscular disorder (HCC)    neuropathy   Neuropathy    Nonalcoholic steatohepatitis 10/05/2018   Obstructive hydrocephalus (HCC) 01/28/2015   OSA -- dx ~ 2012, cpap intolerant 09/04/2014    dx ~ 2012, cpap intolerant    PCP NOTES >>> 04/08/2015   Post-op pain 03/19/2019   Post-traumatic hydrocephalus (HCC)    s/p shunts x 2 (first got infected )   Presence of cerebrospinal fluid drainage device 07/28/2011   Psoriasis    sees Dr Lenis Noon   Psoriatic arthritis (HCC)    REM behavioral disorder 01/14/2017   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   see's Dr.Ortmann   Severe  obesity (BMI >= 40) (HCC) 01/28/2015   SI (sacroiliac) joint dysfunction 08/14/2019   Sigmoid diverticulitis 04/25/2013   Sleep apnea    no CPAP      Spondylolisthesis, lumbar region 03/16/2019   Stomach ulcer    Testosterone deficiency 04/28/2011   VP (ventriculoperitoneal) shunt status 07/31/2020    PAST SURGICAL HISTORY: Past Surgical History:  Procedure Laterality Date   AMPUTATION Left 12/27/2021   Procedure: AMPUTATION GREAT TOE;  Surgeon: Vivi Barrack, DPM;  Location: [facility] OR;  Service: Podiatry;  Laterality: Left;   BACK SURGERY  1980   BRAIN SURGERY     VP shunts placed in 2007   CHOLECYSTECTOMY N/A 08/25/2017   Procedure: LAPAROSCOPIC CHOLECYSTECTOMY WITH INTRAOPERATIVE CHOLANGIOGRAM;  Surgeon: Griselda Miner, MD;  Location[facility] OR;  Service: General;  Laterality: N/A;   COLONOSCOPY     CORONARY STENT INTERVENTION N/A 09/27/2020   Procedure: CORONARY STENT INTERVENTION;  Surgeon: Corky Crafts, MD;  Location: [facility] INVASIVE CV LAB;  Service: Cardiovascular;  Laterality: N/A;   CORONARY ULTRASOUND/IVUS N/A 09/27/2020   Procedure: Intravascular Ultrasound/IVUS;  Surgeon: Corky Crafts, MD;  Location: [facility] INVASIVE CV LAB;  Service: Cardiovascular;  Laterality: N/A;   JOINT REPLACEMENT     total hip   LEFT HEART CATH N/A 09/27/2020   Procedure: Left Heart Cath;  Surgeon: Corky Crafts, MD;  Location: [facility] INVASIVE CV LAB;  Service: Cardiovascular;  Laterality: N/A;   LEFT HEART CATH AND CORONARY ANGIOGRAPHY N/A 09/24/2020   Procedure: LEFT HEART CATH AND CORONARY ANGIOGRAPHY;  Surgeon: Corky Crafts, MD;  Location: [facility] INVASIVE CV LAB;  Service: Cardiovascular;  Laterality: N/A;   LEFT HEART CATH AND CORONARY ANGIOGRAPHY N/A 08/18/2021   Procedure: LEFT HEART CATH AND CORONARY ANGIOGRAPHY;  Surgeon: Lennette Bihari, MD;  Location: [facility] INVASIVE CV LAB;  Service: Cardiovascular;  Laterality: N/A;  LUMBAR FUSION  03/16/2019   SHOULDER SURGERY Left 2010   TEE  WITHOUT CARDIOVERSION N/A 01/01/2022   Procedure: TRANSESOPHAGEAL ECHOCARDIOGRAM (TEE);  Surgeon: Thomasene Ripple, DO;  Location: [facility] ENDOSCOPY;  Service: Cardiovascular;  Laterality: N/A;   TOE SURGERY Left 2018   TONSILLECTOMY     as a child   TOTAL HIP ARTHROPLASTY Left 2011   UPPER GASTROINTESTINAL ENDOSCOPY  01/04/2020   VENTRICULOPERITONEAL SHUNT  2007   x2    FAMILY HISTORY: Family History  Problem Relation Age of Onset   Other Mother    Lung cancer Father        alive, former smoker    Heart disease Brother        MI age 37   Other Brother        Murdered   Down syndrome Son    Diabetes Neg Hx    Prostate cancer Neg Hx    Colon cancer Neg Hx    Stomach cancer Neg Hx    Pancreatic cancer Neg Hx    Liver disease Neg Hx     SOCIAL HISTORY:  Social History   Socioeconomic History   Marital status: Married    Spouse name: Burna Mortimer   Number of children: 2   Years of education: Not on file   Highest education level: 12th grade  Occupational History   Occupation: disabled   Tobacco Use   Smoking status: Never   Smokeless tobacco: Never  Vaping Use   Vaping status: Never Used  Substance and Sexual Activity   Alcohol use: No   Drug use: No   Sexual activity: Yes    Partners: Female  Other Topics Concern   Not on file  Social History Narrative   Household-- pt , wife, one adult son with Down's syndrome   younger son lives in [place]   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   Social Drivers of Corporate investment banker Strain: Low Risk  (02/10/2023)   Overall Financial Resource Strain (CARDIA)    Difficulty of Paying Living Expenses: Not hard at all  Food Insecurity: No Food Insecurity (02/10/2023)   Hunger Vital Sign    Worried About Running Out of Food in the Last Year: Never true    Ran Out of Food in the Last Year: Never true  Transportation Needs: No Transportation Needs (02/10/2023)   PRAPARE - Doctor, general practice (Medical): No    Lack of Transportation (Non-Medical): No  Physical Activity: Unknown (02/10/2023)   Exercise Vital Sign    Days of Exercise per Week: 0 days    Minutes of Exercise per Session: Not on file  Stress: No Stress Concern Present (02/10/2023)   [employer] of Occupational Health - Occupational Stress Questionnaire    Feeling of Stress : Only a little  Social Connections: Moderately Isolated (02/10/2023)   Social Connection and Isolation Panel [NHANES]    Frequency of Communication with Friends and Family: More than three times a week    Frequency of Social Gatherings with Friends and Family: Three times a week    Attends Religious Services: Never    Active Member of Clubs or Organizations: No    Attends Banker Meetings: Not on file    Marital Status: Married  Intimate Partner Violence: Not At Risk (03/25/2023)   Humiliation, Afraid, Rape, and Kick questionnaire    Fear of Current or Ex-Partner: No  Emotionally Abused: No    Physically Abused: No    Sexually Abused: No     PHYSICAL EXAM Today's Vitals   08/11/23 1405  BP: 128/86  Pulse: 78  Weight: 228 lb (103.4 kg)  Height: 5\' 11"  (1.803 m)     Body mass index is 31.8 kg/m.     General: The patient is well-developed and well-nourished and in no acute distress, he has a VP shunt bulb on the left.      Neck: Range of motion is normal in the neck.  She does not have any tenderness at this time.  Skin: Extremities are without significant edema.  Psoriasis is well controlled.  Neurologic Exam  Mental status: The patient is alert and oriented x 3 at the time of the examination.  No ptosis.  The patient has apparent normal recent and remote memory, with an apparently normal attention span and concentration ability.   Speech is normal.  Cranial nerves: Extraocular movements are full.  Facial strength and sensation is normal.  Neck strength is normal.  Hearing is  normal.  Motor: 5/5 strength  Coordination: Cerebellar testing shows good finger-nose-finger  Gait and station: Station is normal.  The gait is arthritic and slightly wide.  Tandem is poor..  Minimal retropulsion noted.    Romberg is negative.     DIAGNOSTIC DATA (LABS, IMAGING, TESTING) - I reviewed patient records, labs, notes, testing and imaging myself where available.  Lab Results  Component Value Date   WBC 8.0 08/02/2023   HGB 14.2 08/02/2023   HCT 44.1 08/02/2023   MCV 87.8 08/02/2023   PLT 146.0 (L) 08/02/2023      Component Value Date/Time   NA 141 08/02/2023 1029   NA 143 07/06/2022 0000   K 4.5 08/02/2023 1029   CL 105 08/02/2023 1029   CO2 23 08/02/2023 1029   GLUCOSE 186 (H) 08/02/2023 1029   BUN 23 08/02/2023 1029   BUN 21 07/06/2022 0000   CREATININE 1.43 08/02/2023 1029   CALCIUM 9.0 08/02/2023 1029   PROT 8.4 (H) 02/12/2023 1356   ALBUMIN 4.6 02/12/2023 1356   AST 32 02/12/2023 1356   ALT 29 02/12/2023 1356   ALKPHOS 76 02/12/2023 1356   BILITOT 1.6 (H) 02/12/2023 1356   GFRNONAA 42 (L) 02/12/2023 1356   GFRAA 66 08/08/2020 1442   Lab Results  Component Value Date   CHOL 132 01/20/2023   HDL 41.00 01/20/2023   LDLCALC 68 01/30/2022   LDLDIRECT 59.0 01/20/2023   TRIG 240.0 (H) 01/20/2023   CHOLHDL 3 01/20/2023   Lab Results  Component Value Date   HGBA1C 7.9 (A) 07/16/2023   Lab Results  Component Value Date   VITAMINB12 490 06/10/2018   Lab Results  Component Value Date   TSH 1.22 06/26/2021       ASSESSMENT AND PLAN  REM behavioral disorder  Chronic migraine w/o aura, not intractable, w/o stat migr  Obstructive hydrocephalus (HCC)  OSA -- dx ~ 2012, cpap intolerant  1. Continue Botox for the chronic migraine 2.   Continue clonazepam nightly for REM behavior disorder and insomnia.   We discussed that there is a relationship between REM behavior disorder and various neurodegenerative problems.  He has no other indication  of a synucleinopathy.   3.  He was unable to tolerate CPAP and does not wish to retry it or consider an oral appliance or Inspire.  4.   rtc for Botox as scheduled, sooner if new or  worsening issues.  This visit is part of a comprehensive longitudinal care medical relationship regarding the patients primary diagnosis of chronic headache and related concerns.   Mykiah Schmuck A. Epimenio Foot, MD, [facility] 08/11/2023, 2:33 PM Certified in Neurology, Clinical Neurophysiology, Sleep Medicine, Pain Medicine and Neuroimaging  [facility] [address], [address] [place], [place] [address] [phone]

## 2023-08-16 ENCOUNTER — Encounter: Payer: Self-pay | Admitting: Urology

## 2023-08-16 ENCOUNTER — Ambulatory Visit: Payer: PPO | Admitting: Urology

## 2023-08-16 VITALS — BP 132/88 | HR 78 | Ht 71.0 in | Wt 225.0 lb

## 2023-08-16 DIAGNOSIS — N529 Male erectile dysfunction, unspecified: Secondary | ICD-10-CM | POA: Insufficient documentation

## 2023-08-16 NOTE — Progress Notes (Signed)
Assessment: 1. Organic impotence     Plan: I personally reviewed the patient's chart including provider notes, and lab results. Today I had a discussion with the patient spending a total of 10 minutes discussing ED.  I discussed the pathophysiology, etiology, and natural history of ED as well as management options using a goal-oriented approach.  We discussed the following options: Medical therapy, vacuum erection device, penile injections, intraurethral suppository therapy (MUSE), and penile prosthesis. He has multiple risk factors for erectile dysfunction and his symptoms have been longstanding. Unfortunately, he is not a candidate for medical therapy due to his use of nitrates. He has previously tried injection therapy and it is unclear if he ever titrated the dose for efficacy. He is interested in repeating a trial of injection therapy. Prescription for Trimix 10/30/[facility]. Instructions on injection method provided to the patient. Return to office in 2-3 weeks for teaching on injection method.  Chief Complaint:  Chief Complaint  Patient presents with   Erectile Dysfunction    History of Present Illness:  James [name] is a 68 y.o. male who is seen for evaluation of erectile dysfunction. He has a long history of erectile dysfunction.  He is currently unable to achieve a spontaneous erection.  He does not have any nocturnal or early morning erections.  No decrease in his libido.  He has previously tried penile injections in 2022.  He reports that these did not work well for him.  It is unclear if he ever titrated up from the starting dose.  He has not used any treatment recently. Risk factors for erectile dysfunction include hypertension, hypercholesterolemia, diabetes, coronary artery disease, beta-blocker use. He is currently on isosorbide mono nitrate for his coronary artery disease.  He reports some mild lower urinary tract symptoms with frequency  and nocturia.  No dysuria or gross hematuria. IPSS = 7. PSA 2/25: 0.65.  He has a history of low testosterone in 2016.  Testosterone level at that time was 217.  Repeat testosterone was 342.   Past Medical History:  Past Medical History:  Diagnosis Date   Abnormal cardiac CT angiography    Acid reflux    Annual physical exam 04/08/2015   Arthritis    Atypical chest pain 06/13/2020   Blood transfusion without reported diagnosis    Body mass index (BMI) 35.0-35.9, adult 04/05/2019   Chronic fatigue 01/28/2015   Chronic headaches    on cymbalta   Chronic kidney disease, stage 3b (HCC) 07/14/2023   Chronic migraine w/o aura, not intractable, w/o stat migr 10/24/2018   Cirrhosis (HCC)    Colon polyps    Complication of anesthesia    problems waking up from anesthesia   Coronary artery disease 01/23/2019   Depression    on cymbalta   Diabetes mellitus with neuropathy (HCC)    Diabetes with neuropathy 04/25/2013   Diverticulitis 03/2013   Dyslipidemia 05/29/2019   Eczema    Elevated LFTs    Epidural lipomatosis 10/05/2018   Essential (primary) hypertension 04/05/2019   Essential hypertension 10/10/2019   Fatty liver    GERD (gastroesophageal reflux disease) 04/28/2011   H/O craniotomy 05/07/2015   Headache 04/28/2011   Hepatitis 10/2017   NASH cirrhosis   History of kidney stones    Hyperlipidemia    Hypersomnia with sleep apnea 01/28/2015   Hypertension    IDA (iron deficiency anemia) 01/24/2019   Idiopathic intracranial hypertension 01/14/2017   Insomnia 04/26/2013   Kidney  stone    Liver cirrhosis secondary to NASH (nonalcoholic steatohepatitis) (HCC) 01/02/2016   Low back pain 04/05/2019   Lower back injury 08/14/2019   Morbid obesity (HCC)    Neuromuscular disorder (HCC)    neuropathy   Neuropathy    Nonalcoholic steatohepatitis 10/05/2018   Obstructive hydrocephalus (HCC) 01/28/2015   OSA -- dx ~ 2012, cpap intolerant 09/04/2014    dx ~ 2012, cpap  intolerant    PCP NOTES >>> 04/08/2015   Post-op pain 03/19/2019   Post-traumatic hydrocephalus (HCC)    s/p shunts x 2 (first got infected )   Presence of cerebrospinal fluid drainage device 07/28/2011   Psoriasis    sees Dr Lenis Noon   Psoriatic arthritis (HCC)    REM behavioral disorder 01/14/2017   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   see's Dr.Ortmann   Severe obesity (BMI >= 40) (HCC) 01/28/2015   SI (sacroiliac) joint dysfunction 08/14/2019   Sigmoid diverticulitis 04/25/2013   Sleep apnea    no CPAP      Spondylolisthesis, lumbar region 03/16/2019   Stomach ulcer    Testosterone deficiency 04/28/2011   VP (ventriculoperitoneal) shunt status 07/31/2020    Past Surgical History:  Past Surgical History:  Procedure Laterality Date   AMPUTATION Left 12/27/2021   Procedure: AMPUTATION GREAT TOE;  Surgeon: Vivi Barrack, DPM;  Location: [facility] OR;  Service: Podiatry;  Laterality: Left;   BACK SURGERY  1980   BRAIN SURGERY     VP shunts placed in 2007   CHOLECYSTECTOMY N/A 08/25/2017   Procedure: LAPAROSCOPIC CHOLECYSTECTOMY WITH INTRAOPERATIVE CHOLANGIOGRAM;  Surgeon: Griselda Miner, MD;  Location: [facility] OR;  Service: General;  Laterality: N/A;   COLONOSCOPY     CORONARY STENT INTERVENTION N/A 09/27/2020   Procedure: CORONARY STENT INTERVENTION;  Surgeon: Corky Crafts, MD;  Location: [facility] INVASIVE CV LAB;  Service: Cardiovascular;  Laterality: N/A;   CORONARY ULTRASOUND/IVUS N/A 09/27/2020   Procedure: Intravascular Ultrasound/IVUS;  Surgeon: Corky Crafts, MD;  Location: [facility] INVASIVE CV LAB;  Service: Cardiovascular;  Laterality: N/A;   JOINT REPLACEMENT     total hip   LEFT HEART CATH N/A 09/27/2020   Procedure: Left Heart Cath;  Surgeon: Corky Crafts, MD;  Location[facility] INVASIVE CV LAB;  Service: Cardiovascular;  Laterality: N/A;   LEFT HEART CATH AND CORONARY ANGIOGRAPHY N/A 09/24/2020   Procedure: LEFT HEART CATH AND CORONARY ANGIOGRAPHY;   Surgeon: Corky Crafts, MD;  Location: [facility] INVASIVE CV LAB;  Service: Cardiovascular;  Laterality: N/A;   LEFT HEART CATH AND CORONARY ANGIOGRAPHY N/A 08/18/2021   Procedure: LEFT HEART CATH AND CORONARY ANGIOGRAPHY;  Surgeon: Lennette Bihari, MD;  Location: [facility] INVASIVE CV LAB;  Service: Cardiovascular;  Laterality: N/A;   LUMBAR FUSION  03/16/2019   SHOULDER SURGERY Left 2010   TEE WITHOUT CARDIOVERSION N/A 01/01/2022   Procedure: TRANSESOPHAGEAL ECHOCARDIOGRAM (TEE);  Surgeon: Thomasene Ripple, DO;  Location: [facility] ENDOSCOPY;  Service: Cardiovascular;  Laterality: N/A;   TOE SURGERY Left 2018   TONSILLECTOMY     as a child   TOTAL HIP ARTHROPLASTY Left 2011   UPPER GASTROINTESTINAL ENDOSCOPY  01/04/2020   VENTRICULOPERITONEAL SHUNT  2007   x2    Allergies:  Allergies  Allergen Reactions   Bee Venom Anaphylaxis   Hydrocodone Bit-Homatrop Mbr Other (See Comments)    Hallucinations, confusion, delirium Depressed feeling   Toradol [Ketorolac Tromethamine] Other (See Comments)    Hallucinations, confusion, delirium   Prednisone  Patient reports it causes cirrhosis to flare up   Sulfadiazine     NDC [id number] NDC [id number] NDC VHQI:[id number]   Morphine And Codeine Other (See Comments)    Hallucinations, back in the 80s. States has taken vicodin before w/o problems    Sulfa Drugs Cross Reactors Rash    Family History:  Family History  Problem Relation Age of Onset   Other Mother    Lung cancer Father        alive, former smoker    Heart disease Brother        MI age 58   Other Brother        Murdered   Down syndrome Son    Diabetes Neg Hx    Prostate cancer Neg Hx    Colon cancer Neg Hx    Stomach cancer Neg Hx    Pancreatic cancer Neg Hx    Liver disease Neg Hx     Social History:  Social History   Tobacco Use   Smoking status: Never   Smokeless tobacco: Never  Vaping Use   Vaping status: Never Used  Substance Use Topics   Alcohol use: No    Drug use: No    Review of symptoms:  Constitutional:  Negative for unexplained weight loss, night sweats, fever, chills ENT:  Negative for nose bleeds, sinus pain, painful swallowing CV:  Negative for chest pain, shortness of breath, exercise intolerance, palpitations, loss of consciousness Resp:  Negative for cough, wheezing, shortness of breath GI:  Negative for nausea, vomiting, diarrhea, bloody stools GU:  Positives noted in HPI; otherwise negative for gross hematuria, dysuria, urinary incontinence Neuro:  Negative for seizures, poor balance, limb weakness, slurred speech Psych:  Negative for lack of energy, depression, anxiety Endocrine:  Negative for polydipsia, polyuria, symptoms of hypoglycemia (dizziness, hunger, sweating) Hematologic:  Negative for anemia, purpura, petechia, prolonged or excessive bleeding, use of anticoagulants  Allergic:  Negative for difficulty breathing or choking as a result of exposure to anything; no shellfish allergy; no allergic response (rash/itch) to materials, foods  Physical exam: BP 132/88   Pulse 78   Ht 5\' 11"  (1.803 m)   Wt 225 lb (102.1 kg)   BMI 31.38 kg/m  GENERAL APPEARANCE:  Well appearing, well developed, well nourished, NAD HEENT: Atraumatic, Normocephalic, oropharynx clear. NECK: Supple without lymphadenopathy or thyromegaly. LUNGS: Clear to auscultation bilaterally. HEART: Regular Rate and Rhythm without murmurs, gallops, or rubs. ABDOMEN: Soft, non-tender, No Masses. EXTREMITIES: Moves all extremities well.  Without clubbing, cyanosis, or edema. NEUROLOGIC:  Alert and oriented x 3, normal gait, CN [name]-XII grossly intact.  MENTAL STATUS:  Appropriate. BACK:  Non-tender to palpation.  No CVAT SKIN:  Warm, dry and intact.   GU: Penis:  circumcised Meatus: Normal Scrotum: normal, no masses Testis: normal without masses left   Results: None

## 2023-08-19 ENCOUNTER — Inpatient Hospital Stay (HOSPITAL_BASED_OUTPATIENT_CLINIC_OR_DEPARTMENT_OTHER)
Admission: EM | Admit: 2023-08-19 | Discharge: 2023-08-21 | DRG: 440 | Disposition: A | Discharge status: Home / Self Care | Payer: PPO | Attending: Internal Medicine | Admitting: Internal Medicine

## 2023-08-19 ENCOUNTER — Encounter (HOSPITAL_BASED_OUTPATIENT_CLINIC_OR_DEPARTMENT_OTHER): Payer: Self-pay

## 2023-08-19 ENCOUNTER — Emergency Department (HOSPITAL_BASED_OUTPATIENT_CLINIC_OR_DEPARTMENT_OTHER): Payer: PPO

## 2023-08-19 ENCOUNTER — Other Ambulatory Visit: Payer: Self-pay

## 2023-08-19 DIAGNOSIS — E66811 Obesity, class 1: Secondary | ICD-10-CM | POA: Diagnosis present

## 2023-08-19 DIAGNOSIS — Z982 Presence of cerebrospinal fluid drainage device: Secondary | ICD-10-CM

## 2023-08-19 DIAGNOSIS — R1013 Epigastric pain: Secondary | ICD-10-CM | POA: Diagnosis present

## 2023-08-19 DIAGNOSIS — K5730 Diverticulosis of large intestine without perforation or abscess without bleeding: Secondary | ICD-10-CM | POA: Diagnosis present

## 2023-08-19 DIAGNOSIS — R071 Chest pain on breathing: Secondary | ICD-10-CM | POA: Diagnosis not present

## 2023-08-19 DIAGNOSIS — K7469 Other cirrhosis of liver: Secondary | ICD-10-CM | POA: Diagnosis present

## 2023-08-19 DIAGNOSIS — E669 Obesity, unspecified: Secondary | ICD-10-CM | POA: Diagnosis not present

## 2023-08-19 DIAGNOSIS — R161 Splenomegaly, not elsewhere classified: Secondary | ICD-10-CM | POA: Diagnosis not present

## 2023-08-19 DIAGNOSIS — R748 Abnormal levels of other serum enzymes: Secondary | ICD-10-CM | POA: Diagnosis not present

## 2023-08-19 DIAGNOSIS — I2510 Atherosclerotic heart disease of native coronary artery without angina pectoris: Secondary | ICD-10-CM | POA: Diagnosis not present

## 2023-08-19 DIAGNOSIS — K859 Acute pancreatitis without necrosis or infection, unspecified: Secondary | ICD-10-CM | POA: Diagnosis not present

## 2023-08-19 DIAGNOSIS — E785 Hyperlipidemia, unspecified: Secondary | ICD-10-CM | POA: Diagnosis present

## 2023-08-19 DIAGNOSIS — R634 Abnormal weight loss: Secondary | ICD-10-CM | POA: Diagnosis not present

## 2023-08-19 DIAGNOSIS — N1832 Chronic kidney disease, stage 3b: Secondary | ICD-10-CM | POA: Diagnosis present

## 2023-08-19 DIAGNOSIS — L4050 Arthropathic psoriasis, unspecified: Secondary | ICD-10-CM | POA: Diagnosis present

## 2023-08-19 DIAGNOSIS — Z882 Allergy status to sulfonamides status: Secondary | ICD-10-CM

## 2023-08-19 DIAGNOSIS — Z794 Long term (current) use of insulin: Secondary | ICD-10-CM | POA: Diagnosis not present

## 2023-08-19 DIAGNOSIS — E1122 Type 2 diabetes mellitus with diabetic chronic kidney disease: Secondary | ICD-10-CM | POA: Diagnosis not present

## 2023-08-19 DIAGNOSIS — K7460 Unspecified cirrhosis of liver: Secondary | ICD-10-CM | POA: Diagnosis not present

## 2023-08-19 DIAGNOSIS — N281 Cyst of kidney, acquired: Secondary | ICD-10-CM | POA: Diagnosis not present

## 2023-08-19 DIAGNOSIS — Z9049 Acquired absence of other specified parts of digestive tract: Secondary | ICD-10-CM

## 2023-08-19 DIAGNOSIS — E1165 Type 2 diabetes mellitus with hyperglycemia: Secondary | ICD-10-CM | POA: Diagnosis present

## 2023-08-19 DIAGNOSIS — R1033 Periumbilical pain: Principal | ICD-10-CM

## 2023-08-19 DIAGNOSIS — Z981 Arthrodesis status: Secondary | ICD-10-CM | POA: Diagnosis not present

## 2023-08-19 DIAGNOSIS — K219 Gastro-esophageal reflux disease without esophagitis: Secondary | ICD-10-CM | POA: Diagnosis not present

## 2023-08-19 DIAGNOSIS — Z7982 Long term (current) use of aspirin: Secondary | ICD-10-CM

## 2023-08-19 DIAGNOSIS — Z6831 Body mass index (BMI) 31.0-31.9, adult: Secondary | ICD-10-CM

## 2023-08-19 DIAGNOSIS — Z96642 Presence of left artificial hip joint: Secondary | ICD-10-CM | POA: Diagnosis not present

## 2023-08-19 DIAGNOSIS — E1140 Type 2 diabetes mellitus with diabetic neuropathy, unspecified: Secondary | ICD-10-CM | POA: Diagnosis not present

## 2023-08-19 DIAGNOSIS — Z8249 Family history of ischemic heart disease and other diseases of the circulatory system: Secondary | ICD-10-CM

## 2023-08-19 DIAGNOSIS — I10 Essential (primary) hypertension: Secondary | ICD-10-CM | POA: Diagnosis present

## 2023-08-19 DIAGNOSIS — Z955 Presence of coronary angioplasty implant and graft: Secondary | ICD-10-CM

## 2023-08-19 DIAGNOSIS — Z87442 Personal history of urinary calculi: Secondary | ICD-10-CM

## 2023-08-19 DIAGNOSIS — E782 Mixed hyperlipidemia: Secondary | ICD-10-CM

## 2023-08-19 DIAGNOSIS — G911 Obstructive hydrocephalus: Secondary | ICD-10-CM | POA: Diagnosis present

## 2023-08-19 DIAGNOSIS — Z888 Allergy status to other drugs, medicaments and biological substances status: Secondary | ICD-10-CM

## 2023-08-19 DIAGNOSIS — I129 Hypertensive chronic kidney disease with stage 1 through stage 4 chronic kidney disease, or unspecified chronic kidney disease: Secondary | ICD-10-CM | POA: Diagnosis not present

## 2023-08-19 DIAGNOSIS — Z7984 Long term (current) use of oral hypoglycemic drugs: Secondary | ICD-10-CM | POA: Diagnosis not present

## 2023-08-19 DIAGNOSIS — Z8279 Family history of other congenital malformations, deformations and chromosomal abnormalities: Secondary | ICD-10-CM

## 2023-08-19 DIAGNOSIS — F32A Depression, unspecified: Secondary | ICD-10-CM | POA: Diagnosis not present

## 2023-08-19 DIAGNOSIS — Z79899 Other long term (current) drug therapy: Secondary | ICD-10-CM | POA: Diagnosis not present

## 2023-08-19 DIAGNOSIS — Z79620 Long term (current) use of immunosuppressive biologic: Secondary | ICD-10-CM

## 2023-08-19 DIAGNOSIS — Z89412 Acquired absence of left great toe: Secondary | ICD-10-CM

## 2023-08-19 DIAGNOSIS — R0789 Other chest pain: Secondary | ICD-10-CM

## 2023-08-19 DIAGNOSIS — K7581 Nonalcoholic steatohepatitis (NASH): Secondary | ICD-10-CM | POA: Diagnosis present

## 2023-08-19 DIAGNOSIS — Z885 Allergy status to narcotic agent status: Secondary | ICD-10-CM

## 2023-08-19 DIAGNOSIS — R072 Precordial pain: Secondary | ICD-10-CM | POA: Diagnosis not present

## 2023-08-19 DIAGNOSIS — R079 Chest pain, unspecified: Secondary | ICD-10-CM | POA: Diagnosis not present

## 2023-08-19 DIAGNOSIS — I25119 Atherosclerotic heart disease of native coronary artery with unspecified angina pectoris: Secondary | ICD-10-CM | POA: Diagnosis not present

## 2023-08-19 DIAGNOSIS — Z860100 Personal history of colon polyps, unspecified: Secondary | ICD-10-CM

## 2023-08-19 DIAGNOSIS — E11649 Type 2 diabetes mellitus with hypoglycemia without coma: Secondary | ICD-10-CM | POA: Diagnosis not present

## 2023-08-19 DIAGNOSIS — G4733 Obstructive sleep apnea (adult) (pediatric): Secondary | ICD-10-CM | POA: Diagnosis present

## 2023-08-19 DIAGNOSIS — Z801 Family history of malignant neoplasm of trachea, bronchus and lung: Secondary | ICD-10-CM

## 2023-08-19 DIAGNOSIS — K8500 Idiopathic acute pancreatitis without necrosis or infection: Secondary | ICD-10-CM | POA: Diagnosis not present

## 2023-08-19 LAB — CBC WITH DIFFERENTIAL/PLATELET
Abs Immature Granulocytes: 0.01 10*3/uL (ref 0.00–0.07)
Basophils Absolute: 0 10*3/uL (ref 0.0–0.1)
Basophils Relative: 0 %
Eosinophils Absolute: 0.1 10*3/uL (ref 0.0–0.5)
Eosinophils Relative: 2 %
HCT: 42 % (ref 39.0–52.0)
Hemoglobin: 13.6 g/dL (ref 13.0–17.0)
Immature Granulocytes: 0 %
Lymphocytes Relative: 20 %
Lymphs Abs: 1.4 10*3/uL (ref 0.7–4.0)
MCH: 27.6 pg (ref 26.0–34.0)
MCHC: 32.4 g/dL (ref 30.0–36.0)
MCV: 85.4 fL (ref 80.0–100.0)
Monocytes Absolute: 0.6 10*3/uL (ref 0.1–1.0)
Monocytes Relative: 8 %
Neutro Abs: 4.8 10*3/uL (ref 1.7–7.7)
Neutrophils Relative %: 70 %
Platelets: 190 10*3/uL (ref 150–400)
RBC: 4.92 MIL/uL (ref 4.22–5.81)
RDW: 16.6 % — ABNORMAL HIGH (ref 11.5–15.5)
WBC: 6.9 10*3/uL (ref 4.0–10.5)
nRBC: 0 % (ref 0.0–0.2)

## 2023-08-19 LAB — TSH: TSH: 1.693 u[IU]/mL (ref 0.350–4.500)

## 2023-08-19 LAB — COMPREHENSIVE METABOLIC PANEL
ALT: 27 U/L (ref 0–44)
AST: 20 U/L (ref 15–41)
Albumin: 3.3 g/dL — ABNORMAL LOW (ref 3.5–5.0)
Alkaline Phosphatase: 116 U/L (ref 38–126)
Anion gap: 11 (ref 5–15)
BUN: 19 mg/dL (ref 8–23)
CO2: 23 mmol/L (ref 22–32)
Calcium: 8.9 mg/dL (ref 8.9–10.3)
Chloride: 102 mmol/L (ref 98–111)
Creatinine, Ser: 1.4 mg/dL — ABNORMAL HIGH (ref 0.61–1.24)
GFR, Estimated: 56 mL/min — ABNORMAL LOW (ref >60)
Glucose, Bld: 242 mg/dL — ABNORMAL HIGH (ref 70–99)
Potassium: 4.2 mmol/L (ref 3.5–5.1)
Sodium: 136 mmol/L (ref 135–145)
Total Bilirubin: 1 mg/dL (ref 0.0–1.2)
Total Protein: 8 g/dL (ref 6.5–8.1)

## 2023-08-19 LAB — GLUCOSE, CAPILLARY
Glucose-Capillary: 151 mg/dL — ABNORMAL HIGH (ref 70–99)
Glucose-Capillary: 182 mg/dL — ABNORMAL HIGH (ref 70–99)

## 2023-08-19 LAB — LIPASE, BLOOD: Lipase: 2066 U/L — ABNORMAL HIGH (ref 11–51)

## 2023-08-19 LAB — HIV ANTIBODY (ROUTINE TESTING W REFLEX): HIV Screen 4th Generation wRfx: NONREACTIVE

## 2023-08-19 LAB — TROPONIN I (HIGH SENSITIVITY)
Troponin I (High Sensitivity): 5 ng/L (ref <18)
Troponin I (High Sensitivity): 4 ng/L (ref <18)

## 2023-08-19 MED ORDER — SODIUM CHLORIDE 0.9% FLUSH
3.0000 mL | Freq: Two times a day (BID) | INTRAVENOUS | Status: DC
  Administered 2023-08-19 – 2023-08-21 (×3): 3 mL via INTRAVENOUS

## 2023-08-19 MED ORDER — FENTANYL CITRATE PF 50 MCG/ML IJ SOSY: 25.0000 ug | PREFILLED_SYRINGE | INTRAMUSCULAR | Status: DC | PRN

## 2023-08-19 MED ORDER — DICYCLOMINE HCL 20 MG PO TABS: 20.0000 mg | ORAL_TABLET | Freq: Two times a day (BID) | ORAL | Status: DC | PRN

## 2023-08-19 MED ORDER — ASPIRIN 81 MG PO CHEW
81.0000 mg | CHEWABLE_TABLET | Freq: Every day | ORAL | Status: DC
  Administered 2023-08-19 – 2023-08-21 (×3): 81 mg via ORAL
  Filled 2023-08-19 (×3): qty 1

## 2023-08-19 MED ORDER — IOHEXOL 350 MG/ML SOLN
100.0000 mL | Freq: Once | INTRAVENOUS | Status: AC | PRN
  Administered 2023-08-19: 100 mL via INTRAVENOUS

## 2023-08-19 MED ORDER — ALBUTEROL SULFATE (2.5 MG/3ML) 0.083% IN NEBU: 2.5000 mg | INHALATION_SOLUTION | Freq: Four times a day (QID) | RESPIRATORY_TRACT | Status: DC | PRN

## 2023-08-19 MED ORDER — INSULIN ASPART 100 UNIT/ML IJ SOLN
0.0000 [IU] | Freq: Every day | INTRAMUSCULAR | Status: DC
  Administered 2023-08-20: 3 [IU] via SUBCUTANEOUS

## 2023-08-19 MED ORDER — GABAPENTIN 300 MG PO CAPS
600.0000 mg | ORAL_CAPSULE | Freq: Two times a day (BID) | ORAL | Status: DC
  Administered 2023-08-19 – 2023-08-21 (×4): 600 mg via ORAL
  Filled 2023-08-19 (×4): qty 2

## 2023-08-19 MED ORDER — CARVEDILOL 12.5 MG PO TABS
12.5000 mg | ORAL_TABLET | Freq: Two times a day (BID) | ORAL | Status: DC
  Administered 2023-08-19 – 2023-08-21 (×4): 12.5 mg via ORAL
  Filled 2023-08-19 (×4): qty 1

## 2023-08-19 MED ORDER — DULOXETINE HCL 60 MG PO CPEP
120.0000 mg | ORAL_CAPSULE | Freq: Every day | ORAL | Status: DC
  Administered 2023-08-19 – 2023-08-21 (×3): 120 mg via ORAL
  Filled 2023-08-19 (×3): qty 2

## 2023-08-19 MED ORDER — EMPAGLIFLOZIN 10 MG PO TABS
10.0000 mg | ORAL_TABLET | Freq: Every day | ORAL | Status: DC
  Administered 2023-08-20 – 2023-08-21 (×2): 10 mg via ORAL
  Filled 2023-08-19 (×2): qty 1

## 2023-08-19 MED ORDER — ATORVASTATIN CALCIUM 80 MG PO TABS
80.0000 mg | ORAL_TABLET | Freq: Every day | ORAL | Status: DC
Start: 2023-08-19 — End: 2023-08-21
  Administered 2023-08-19 – 2023-08-20 (×2): 80 mg via ORAL
  Filled 2023-08-19 (×2): qty 1

## 2023-08-19 MED ORDER — ACETAMINOPHEN 325 MG PO TABS
650.0000 mg | ORAL_TABLET | Freq: Four times a day (QID) | ORAL | Status: DC | PRN
  Administered 2023-08-20: 650 mg via ORAL
  Filled 2023-08-19: qty 2

## 2023-08-19 MED ORDER — AMLODIPINE BESYLATE 5 MG PO TABS
5.0000 mg | ORAL_TABLET | Freq: Every day | ORAL | Status: DC
  Administered 2023-08-20 – 2023-08-21 (×2): 5 mg via ORAL
  Filled 2023-08-19 (×2): qty 1

## 2023-08-19 MED ORDER — TRAMADOL HCL 50 MG PO TABS
50.0000 mg | ORAL_TABLET | Freq: Four times a day (QID) | ORAL | Status: DC | PRN
  Administered 2023-08-19: 50 mg via ORAL
  Filled 2023-08-19: qty 1

## 2023-08-19 MED ORDER — INSULIN ASPART 100 UNIT/ML IJ SOLN
0.0000 [IU] | Freq: Three times a day (TID) | INTRAMUSCULAR | Status: DC
  Administered 2023-08-19 – 2023-08-20 (×3): 3 [IU] via SUBCUTANEOUS
  Administered 2023-08-20 – 2023-08-21 (×2): 2 [IU] via SUBCUTANEOUS

## 2023-08-19 MED ORDER — CLONAZEPAM 0.5 MG PO TABS
0.5000 mg | ORAL_TABLET | Freq: Every day | ORAL | Status: DC
  Administered 2023-08-19 – 2023-08-20 (×2): 0.5 mg via ORAL
  Filled 2023-08-19 (×2): qty 1

## 2023-08-19 MED ORDER — ACETAMINOPHEN 650 MG RE SUPP: 650.0000 mg | Freq: Four times a day (QID) | RECTAL | Status: DC | PRN

## 2023-08-19 MED ORDER — ISOSORBIDE MONONITRATE ER 60 MG PO TB24
120.0000 mg | ORAL_TABLET | Freq: Every day | ORAL | Status: DC
  Administered 2023-08-19 – 2023-08-21 (×3): 120 mg via ORAL
  Filled 2023-08-19 (×3): qty 2

## 2023-08-19 MED ORDER — ONDANSETRON HCL 4 MG PO TABS: 4.0000 mg | ORAL_TABLET | Freq: Four times a day (QID) | ORAL | Status: DC | PRN

## 2023-08-19 MED ORDER — CLOBETASOL PROPIONATE 0.05 % EX CREA: 1.0000 | TOPICAL_CREAM | Freq: Two times a day (BID) | CUTANEOUS | Status: DC | PRN

## 2023-08-19 MED ORDER — ONDANSETRON HCL 4 MG/2ML IJ SOLN: 4.0000 mg | Freq: Four times a day (QID) | INTRAMUSCULAR | Status: DC | PRN

## 2023-08-19 MED ORDER — SODIUM CHLORIDE 0.9 % IV SOLN: INTRAVENOUS | Status: DC

## 2023-08-19 MED ORDER — ENOXAPARIN SODIUM 40 MG/0.4ML IJ SOSY
40.0000 mg | PREFILLED_SYRINGE | INTRAMUSCULAR | Status: DC
  Administered 2023-08-19 – 2023-08-20 (×2): 40 mg via SUBCUTANEOUS
  Filled 2023-08-19 (×2): qty 0.4

## 2023-08-19 MED ORDER — PANTOPRAZOLE SODIUM 40 MG PO TBEC
40.0000 mg | DELAYED_RELEASE_TABLET | Freq: Every day | ORAL | Status: DC
  Administered 2023-08-19 – 2023-08-21 (×3): 40 mg via ORAL
  Filled 2023-08-19 (×3): qty 1

## 2023-08-19 NOTE — Consult Note (Addendum)
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 Stomach is within normal limits. Appendix appears normal. No evidence of bowel wall thickening, distention, or inflammatory changes. Sigmoid diverticulosis. Vascular/Lymphatic: Aortic atherosclerosis. No enlarged abdominal or pelvic lymph nodes. Reproductive: No mass or other significant abnormality. Other: No abdominal wall hernia or abnormality. No ascites. Shunt catheter tubing, tip positioned in the left lower quadrant (series 306, image 69). Musculoskeletal: No acute or significant osseous findings. Status post left hip total arthroplasty. IMPRESSION: 1. Negative examination for pulmonary embolism. 2. No acute CT findings of the  chest, abdomen, or pelvis to explain right lower quadrant pain. 3. Cirrhosis. 4. Splenomegaly. 5. Small nonobstructive calculus of the midportion of the right kidney. No left-sided calculi, ureteral calculi, or hydronephrosis. 6. Sigmoid diverticulosis without evidence of acute diverticulitis. 7. Coronary artery disease. Aortic Atherosclerosis (ICD10-I70.0). Electronically  Signed   By: Jearld Lesch M.D.   On: 08/19/2023 10:46   DG Chest Port 1 View Result Date: 08/19/2023 CLINICAL DATA:  Mid chest, epigastric, and lower right abdominal pain for 4 days. Pain with inspiration. EXAM: PORTABLE CHEST 1 VIEW COMPARISON:  11/12/2022 FINDINGS: Single frontal view of the chest demonstrates an unremarkable cardiac silhouette. No acute airspace disease, effusion, or pneumothorax. Stable postsurgical changes from upper thoracic posterior fusion. No acute fractures. Stable ventriculostomy catheter tubing overlying left chest. IMPRESSION: 1. No acute intrathoracic process. Electronically Signed   By: Sharlet Salina M.D.   On: 08/19/2023 08:58    IMPRESSION/PLAN:  68 year old male admitted with Epigastric, RUQ/RLQ abdominal pain.  Lipase level 2,006.  Normal LFTs.  CTAP without evidence of pancreatitis.  No alcohol use.  No new medications within the past 3 to 4 months.  Normal calcium level.  Triglyceride level pending.  HIV pending.  No prior history of pancreatitis. -NPO -IV fluids and pain management per the hospitalist -Ondansetron 4 mg p.o. or IV every 6 hours as needed -Hepatic panel, lipase and Ig4 level in a.m. -Await further recommendations per Dr. Leonides Schanz  GERD, stable -Pantoprazole 40 mg daily  History of colon polyps  NASH cirrhosis. CTAP consistent with cirrhosis without hepatoma, splenomegaly.  EGD 12/2022 showed trace esophageal varix and mild portal hypertensive gastropathy. -CBC, CMP and PT/INR in a.m. to update MELD score. AFP in am.  History of CAD s/p placement 2022.  No angina.  DM type [name]   Arnaldo Natal  08/19/2023, 4:47PM

## 2023-08-19 NOTE — ED Provider Notes (Addendum)
[facility] Provider Note   CSN: [id number] Arrival date & time: 08/19/23  4098     History  Chief Complaint  Patient presents with   Chest Pain    James [name] is a 68 y.o. male.  Patient with a complaint of mid substernal chest pain constant for 4 days.  Associated with abdominal pain in the periumbilical area.  No nausea vomiting or diarrhea.  Does hurt more to take a deep breath.  Patient has a history of coronary artery disease.  Followed by cardiology.  Last saw them January 17.  Patient status post PTCA of the LAD in 2022.  Has diabetes hypertension hyperlipidemia nonalcoholic cirrhosis of the liver.  Patient did have cardiac catheterization performed performed in 2023 that did show about a 75% lesion of the circumflex artery also had multiple residual 30 to 50% lesions.  Patient states this seems to be different than his angina pain.       Home Medications Prior to Admission medications   Medication Sig Start Date End Date Taking? Authorizing Provider  acetaminophen (TYLENOL) 325 MG tablet Take 2 tablets (650 mg total) by mouth every 4 (four) hours as needed for headache or mild pain. 08/18/21   Leone Brand, NP  adalimumab (HUMIRA, 2 PEN,) 40 MG/0.8ML AJKT pen Inject 40mg  (0.69ml) under the skin every other week. Patient taking differently: Inject 40 mg into the skin every 14 (fourteen) days. 04/01/23   Quentin Angst, MD  amLODipine (NORVASC) 5 MG tablet Take 1 tablet (5 mg total) by mouth daily. 12/29/22   Georgeanna Lea, MD  aspirin 81 MG chewable tablet Chew 1 tablet (81 mg total) by mouth daily. 08/19/21   Leone Brand, NP  atorvastatin (LIPITOR) 80 MG tablet Take 1 tablet (80 mg total) by mouth at bedtime. 03/24/23   Wanda Plump, MD  botulinum toxin Type A (BOTOX) 200 units injection Provider to inject 155 units into the muscles of the head and neck every 3 months. Discard remainder. Patient taking  differently: Inject 155 Units into the muscle every 3 (three) months. Provider to inject 155 units into the muscles of the head and neck every 3 months. Discard remainder. 10/12/22   Sater, Pearletha Furl, MD  carvedilol (COREG) 12.5 MG tablet Take 1 tablet (12.5 mg total) by mouth 2 (two) times daily with a meal. 04/23/23   Paz, Nolon Rod, MD  clobetasol (TEMOVATE) 0.05 % external solution Apply 1 application externally twice a day 14 days. Patient taking differently: Apply 1 Application topically 2 (two) times daily. 10/01/22     clonazePAM (KLONOPIN) 0.5 MG tablet Take 1 tablet (0.5 mg total) by mouth at bedtime. 03/29/23 09/25/23  Sater, Pearletha Furl, MD  Continuous Glucose Sensor (DEXCOM G6 SENSOR) MISC Use to monitor blood sugar, change after 10 days 07/16/23   Thapa, [place], MD  Continuous Glucose Transmitter (DEXCOM G6 TRANSMITTER) MISC 1 Device by Does not apply route continuous. 07/16/23   Thapa, [place], MD  dicyclomine (BENTYL) 20 MG tablet Take 1 tablet (20 mg total) by mouth 2 (two) times daily as needed. Patient taking differently: Take 20 mg by mouth 2 (two) times daily as needed for spasms. 02/12/23   Gowens, Mariah L, PA-C  DULoxetine (CYMBALTA) 60 MG capsule Take 2 capsules (120 mg total) by mouth daily. 03/24/23   Wanda Plump, MD  empagliflozin (JARDIANCE) 10 MG TABS tablet Take 1 tablet (10 mg total) by mouth  daily with breakfast. 08/02/23   Thapa, [place], MD  EPINEPHrine (EPIPEN 2-PAK) 0.3 mg/0.3 mL IJ SOAJ injection Inject 0.3 mLs (0.3 mg total) into the muscle once as needed for up to 1 dose for anaphylaxis. 01/10/20   Wanda Plump, MD  fenofibrate micronized (LOFIBRA) 134 MG capsule Take 1 capsule (134 mg total) by mouth 3 (three) times a week. 12/02/22 12/02/23  Reather Littler, MD  gabapentin (NEURONTIN) 600 MG tablet Take 1 tablet (600 mg total) by mouth 2 (two) times daily. 04/09/23   Wanda Plump, MD  Insulin Disposable Pump (OMNIPOD 5 G6 PODS, GEN 5,) MISC Use as directed every 3 (three) days. 09/09/22    Reather Littler, MD  insulin lispro (HUMALOG) 100 UNIT/ML injection Use up to 60 Units daily in pump Patient taking differently: Inject 60 Units into the skin See admin instructions. Use up to 60 Units daily in pump 10/07/22   Reather Littler, MD  Insulin Syringe-Needle U-100 (INSULIN SYRINGE 1CC/31GX5/16") 31G X 5/16" 1 ML MISC Use to administer Humalog 3 times a day Patient taking differently: 1 tablet by Other route See admin instructions. Use to administer Humalog 3 times a day 10/07/22   Reather Littler, MD  isosorbide mononitrate (IMDUR) 120 MG 24 hr tablet Take 1 tablet (120 mg total) by mouth daily. 07/14/22   Georgeanna Lea, MD  metFORMIN (GLUCOPHAGE) 500 MG tablet Take 1 tablet (500 mg total) by mouth 2 (two) times daily. 04/16/23   Thapa, [place], MD  nitroGLYCERIN (NITROSTAT) 0.4 MG SL tablet Place 1 tablet (0.4 mg total) under the tongue every 5 (five) minutes for 3 doses as needed for chest pain. 06/26/21   Wanda Plump, MD  ondansetron (ZOFRAN) 4 MG tablet Take 1 tablet (4 mg total) by mouth every 8 (eight) hours as needed. Patient taking differently: Take 4 mg by mouth every 8 (eight) hours as needed for nausea or vomiting. 02/12/23   Gowens, Mariah L, PA-C  pantoprazole (PROTONIX) 40 MG tablet Take 1 tablet (40 mg total) by mouth daily. 03/16/23   Wanda Plump, MD  traMADol (ULTRAM) 50 MG tablet Take 1 tablet (50 mg total) by mouth every 6 (six) hours as needed for up to 10 doses for severe pain. 10/18/22   Mardene Sayer, MD      Allergies    Bee venom, Hydrocodone bit-homatrop mbr, Toradol [ketorolac tromethamine], Prednisone, Sulfadiazine, Morphine and codeine, and Sulfa drugs cross reactors    Review of Systems   Review of Systems  Constitutional:  Negative for chills and fever.  HENT:  Negative for rhinorrhea and sore throat.   Eyes:  Negative for visual disturbance.  Respiratory:  Negative for cough and shortness of breath.   Cardiovascular:  Positive for chest pain. Negative for  leg swelling.  Gastrointestinal:  Positive for abdominal pain. Negative for diarrhea, nausea and vomiting.  Genitourinary:  Negative for dysuria.  Musculoskeletal:  Negative for back pain and neck pain.  Skin:  Negative for rash.  Neurological:  Negative for dizziness, light-headedness and headaches.  Hematological:  Does not bruise/bleed easily.  Psychiatric/Behavioral:  Negative for confusion.     Physical Exam Updated Vital Signs BP 127/87   Pulse 80   Temp 97.6 F (36.4 C)   Resp 19   Ht 1.803 m (5\' 11" )   Wt 102.1 kg   SpO2 95%   BMI 31.38 kg/m  Physical Exam Vitals and nursing note reviewed.  Constitutional:      General:  He is not in acute distress.    Appearance: Normal appearance. He is well-developed.  HENT:     Head: Normocephalic and atraumatic.  Eyes:     Extraocular Movements: Extraocular movements intact.     Conjunctiva/sclera: Conjunctivae normal.     Pupils: Pupils are equal, round, and reactive to light.  Cardiovascular:     Rate and Rhythm: Normal rate and regular rhythm.     Heart sounds: No murmur heard. Pulmonary:     Effort: Pulmonary effort is normal. No respiratory distress.     Breath sounds: Normal breath sounds.  Abdominal:     General: There is distension.     Palpations: Abdomen is soft.     Tenderness: There is no abdominal tenderness. There is no guarding.  Musculoskeletal:        General: No swelling.     Cervical back: Neck supple.  Skin:    General: Skin is warm and dry.     Capillary Refill: Capillary refill takes less than 2 seconds.  Neurological:     General: No focal deficit present.     Mental Status: He is alert and oriented to person, place, and time.  Psychiatric:        Mood and Affect: Mood normal.     ED Results / Procedures / Treatments   Labs (all labs ordered are listed, but only abnormal results are displayed) Labs Reviewed  CBC WITH DIFFERENTIAL/PLATELET - Abnormal; Notable for the following components:       Result Value   RDW 16.6 (*)    All other components within normal limits  COMPREHENSIVE METABOLIC PANEL - Abnormal; Notable for the following components:   Glucose, Bld 242 (*)    Creatinine, Ser 1.40 (*)    Albumin 3.3 (*)    GFR, Estimated 56 (*)    All other components within normal limits  LIPASE, BLOOD - Abnormal; Notable for the following components:   Lipase 2,066 (*)    All other components within normal limits  TROPONIN I (HIGH SENSITIVITY)  TROPONIN I (HIGH SENSITIVITY)    EKG EKG Interpretation Date/Time:  Thursday August 19 2023 08:30:28 EST Ventricular Rate:  82 PR Interval:  138 QRS Duration:  94 QT Interval:  386 QTC Calculation: 451 R Axis:   -10  Text Interpretation: Sinus rhythm Abnormal R-wave progression, early transition Inferior infarct, old No significant change since last tracing Confirmed by Vanetta Mulders [phone]) on 08/19/2023 8:38:23 AM  Radiology CT Angio Chest PE W/Cm &/Or Wo Cm Result Date: 08/19/2023 CLINICAL DATA:  Chest and abdominal pain, right lower abdominal pain for 4 days EXAM: CT ANGIOGRAPHY CHEST CT ABDOMEN AND PELVIS WITH CONTRAST TECHNIQUE: Multidetector CT imaging of the chest was performed using the standard protocol during bolus administration of intravenous contrast. Multiplanar CT image reconstructions and MIPs were obtained to evaluate the vascular anatomy. Multidetector CT imaging of the abdomen and pelvis was performed using the standard protocol during bolus administration of intravenous contrast. RADIATION DOSE REDUCTION: This exam was performed according to the departmental dose-optimization program which includes automated exposure control, adjustment of the mA and/or kV according to patient size and/or use of iterative reconstruction technique. CONTRAST:  OMNIPAQUE IOHEXOL 350 MG/ML SOLN COMPARISON:  None Available. FINDINGS: CT CHEST ANGIOGRAM FINDINGS Cardiovascular: Satisfactory opacification of the pulmonary  arteries to the segmental level. No evidence of pulmonary embolism. Normal heart size. Left and right coronary artery calcifications. No pericardial effusion. Scattered aortic atherosclerosis. Mediastinum/Nodes: No enlarged mediastinal, hilar,  or axillary lymph nodes. Thyroid gland, trachea, and esophagus demonstrate no significant findings. Lungs/Pleura: Lungs are clear. No pleural effusion or pneumothorax. Musculoskeletal: No chest wall abnormality. No acute osseous findings. Review of the MIP images confirms the above findings. CT ABDOMEN PELVIS FINDINGS Hepatobiliary: No solid liver abnormality is seen. Coarse, nodular cirrhotic morphology of the liver. Status post status post cholecystectomy. No biliary ductal dilatation. Pancreas: Unremarkable. No pancreatic ductal dilatation or surrounding inflammatory changes. Spleen: Splenomegaly, maximum span 16.1 cm. Adrenals/Urinary Tract: Adrenal glands are unremarkable. Small nonobstructive calculus of the midportion of the right kidney. No left-sided calculi, ureteral calculi, or hydronephrosis. 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 Bladder is unremarkable. Stomach/Bowel: Stomach is within normal limits. Appendix appears normal. No evidence of bowel wall thickening, distention, or inflammatory changes. Sigmoid diverticulosis. Vascular/Lymphatic: Aortic atherosclerosis. No enlarged abdominal or pelvic lymph nodes. Reproductive: No mass or other significant abnormality. Other: No abdominal wall hernia or abnormality. No ascites. Shunt catheter tubing, tip positioned in the left lower quadrant (series 306, image 69). Musculoskeletal: No acute or significant osseous findings. Status post left hip total arthroplasty. IMPRESSION: 1. Negative examination for pulmonary embolism. 2. No acute CT findings of the chest, abdomen, or  pelvis to explain right lower quadrant pain. 3. Cirrhosis. 4. Splenomegaly. 5. Small nonobstructive calculus of the midportion of the right kidney. No left-sided calculi, ureteral calculi, or hydronephrosis. 6. Sigmoid diverticulosis without evidence of acute diverticulitis. 7. Coronary artery disease. Aortic Atherosclerosis (ICD10-I70.0). Electronically Signed   By: Jearld Lesch M.D.   On: 08/19/2023 10:46   CT ABDOMEN PELVIS W CONTRAST Result Date: 08/19/2023 CLINICAL DATA:  Chest and abdominal pain, right lower abdominal pain for 4 days EXAM: CT ANGIOGRAPHY CHEST CT ABDOMEN AND PELVIS WITH CONTRAST TECHNIQUE: Multidetector CT imaging of the chest was performed using the standard protocol during bolus administration of intravenous contrast. Multiplanar CT image reconstructions and MIPs were obtained to evaluate the vascular anatomy. Multidetector CT imaging of the abdomen and pelvis was performed using the standard protocol during bolus administration of intravenous contrast. RADIATION DOSE REDUCTION: This exam was performed according to the departmental dose-optimization program which includes automated exposure control, adjustment of the mA and/or kV according to patient size and/or use of iterative reconstruction technique. CONTRAST:  OMNIPAQUE IOHEXOL 350 MG/ML SOLN COMPARISON:  None Available. FINDINGS: CT CHEST ANGIOGRAM FINDINGS Cardiovascular: Satisfactory opacification of the pulmonary arteries to the segmental level. No evidence of pulmonary embolism. Normal heart size. Left and right coronary artery calcifications. No pericardial effusion. Scattered aortic atherosclerosis. Mediastinum/Nodes: No enlarged mediastinal, hilar, or axillary lymph nodes. Thyroid gland, trachea, and esophagus demonstrate no significant findings. Lungs/Pleura: Lungs are clear. No pleural effusion or pneumothorax. Musculoskeletal: No chest wall abnormality. No acute osseous findings. Review of the MIP images confirms  the above findings. CT ABDOMEN PELVIS FINDINGS Hepatobiliary: No solid liver abnormality is seen. Coarse, nodular cirrhotic morphology of the liver. Status post status post cholecystectomy. No biliary ductal dilatation. Pancreas: Unremarkable. No pancreatic ductal dilatation or surrounding inflammatory changes. Spleen: Splenomegaly, maximum span 16.1 cm. Adrenals/Urinary Tract: Adrenal glands are unremarkable. Small nonobstructive calculus of the midportion of the right kidney. No left-sided calculi, ureteral calculi, or hydronephrosis. 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 Bladder is unremarkable. Stomach/Bowel: Stomach is within  normal limits. Appendix appears normal. No evidence of bowel wall thickening, distention, or inflammatory changes. Sigmoid diverticulosis. Vascular/Lymphatic: Aortic atherosclerosis. No enlarged abdominal or pelvic lymph nodes. Reproductive: No mass or other significant abnormality. Other: No abdominal wall hernia or abnormality. No ascites. Shunt catheter tubing, tip positioned in the left lower quadrant (series 306, image 69). Musculoskeletal: No acute or significant osseous findings. Status post left hip total arthroplasty. IMPRESSION: 1. Negative examination for pulmonary embolism. 2. No acute CT findings of the chest, abdomen, or pelvis to explain right lower quadrant pain. 3. Cirrhosis. 4. Splenomegaly. 5. Small nonobstructive calculus of the midportion of the right kidney. No left-sided calculi, ureteral calculi, or hydronephrosis. 6. Sigmoid diverticulosis without evidence of acute diverticulitis. 7. Coronary artery disease. Aortic Atherosclerosis (ICD10-I70.0). Electronically Signed   By: Jearld Lesch M.D.   On: 08/19/2023 10:46   DG Chest Port 1 View Result Date: 08/19/2023 CLINICAL DATA:  Mid chest, epigastric, and lower right  abdominal pain for 4 days. Pain with inspiration. EXAM: PORTABLE CHEST 1 VIEW COMPARISON:  11/12/2022 FINDINGS: Single frontal view of the chest demonstrates an unremarkable cardiac silhouette. No acute airspace disease, effusion, or pneumothorax. Stable postsurgical changes from upper thoracic posterior fusion. No acute fractures. Stable ventriculostomy catheter tubing overlying left chest. IMPRESSION: 1. No acute intrathoracic process. Electronically Signed   By: Sharlet Salina M.D.   On: 08/19/2023 08:58    Procedures Procedures    Medications Ordered in ED Medications  0.9 %  sodium chloride infusion (has no administration in time range)  iohexol (OMNIPAQUE) 350 MG/ML injection 100 mL (100 mLs Intravenous Contrast Given 08/19/23 0955)    ED Course/ Medical Decision Making/ A&P                                 Medical Decision Making Amount and/or Complexity of Data Reviewed Labs: ordered. Radiology: ordered.  Risk Prescription drug management. Decision regarding hospitalization.   The 2 complaints seem to be separate.  But they did start around the same time.  Workup for the chest pain will will include EKG which has been done without any significant changes.  Chest x-ray troponins x 2.  Workup for the abdominal pain include complete metabolic panel lipase.  Based on the complaints I think he needs CT scan.  So based on that since he does have some pain worse with taking a deep breath we will do CT angio chest and CT abdomen pelvis with contrast.  No oral contrast required.  CBC white count 6.9 hemoglobin 13.6.  Platelets 190.  Complete metabolic panel liver function test normal GFR down a little bit at 56 creatinine up some at 1.4 glucose 242 CO2 23 electrolytes normal.  Initial troponin very reassuring at 5 basically with this chest pain ongoing for several days.  And chest x-ray had no acute process.  Will see what we get on the CT angio chest and CT abdomen.  Patient's  troponins stable initial troponin was 5 repeat was 4.  Lipase markedly elevated 2000 CBC no leukocytosis hemoglobin 12.6.  Liver function test as mentioned above without any significant abnormalities.  The CT angio chest was negative for any pulmonary embolism.  CT abdomen no acute findings of the chest abdomen or pelvis to explain the quadrant pain.  There is some evidence of some cirrhosis.  Splenomegaly nonobstructive calculus midportion right kidney no left-sided calculi ureteral calculi or hydronephrosis.  Sigmoid diverticulosis without  acute diverticulitis.  On particular the hepatobiliary no solid liver abnormalities seen coarse nodular cirrhotic morphology of the liver status post gallbladder removal no biliary to talk dilatation the pancreas is unremarkable no pancreatic ductal dilatation or surrounding inflammation.  Patient cleared from a cardiac standpoint  Discussed with [facility] gastroenterology physician assistant covering for Dr. Chales Abrahams.  They are recommending medicine admission and if they need to do an additional evaluation in the light of the lipase being so high him being symptomatic and there being no specific findings on CT scan.  Will contact hospitalist for admission  Final Clinical Impression(s) / ED Diagnoses Final diagnoses:  Periumbilical abdominal pain  Atypical chest pain  Epigastric abdominal pain  Cirrhosis, nonalcoholic [facility])    Rx / DC Orders ED Discharge Orders     None         Vanetta Mulders, MD 08/19/23 [id number]    Vanetta Mulders, MD 08/19/23 0930    Vanetta Mulders, MD 08/19/23 1155    Vanetta Mulders, MD 08/19/23 1229    Vanetta Mulders, MD 08/19/23 1233

## 2023-08-19 NOTE — H&P (Addendum)
History and Physical    Patient: James [name] GNF:[id number] DOB: [birth year] DOA: 08/19/2023 DOS: the patient was seen and examined on 08/19/2023 PCP: Wanda Plump, MD  Patient coming from: Transfer from [facility]  Chief Complaint:  Chief Complaint  Patient presents with   Abdominal pain    HPI: James [name] is a 68 y.o. male with medical history significant of hypertension, hyperlipidemia, CAD s/p PTCA of the LAD in 2022, diabetes mellitus type 2, CKD stage IIIa, NASH cirrhosis, psoriatic arthritis, obesity, and OSA intolerant of CPAP who presented with complaints of abdominal pain for the past four days, localized to the right upper quadrant epigastric region. The pain is constant with episodes of increased intensity that 'hurt for a little bit and then go away'. It has been severe enough to wake them from sleep and has persisted over the past few nights.  No associated symptoms such as fever, cough, shortness of breath, nausea, vomiting, chest pain, abdominal swelling, leg swelling, or urinary discomfort. They have regular daily bowel movements without any issues.  Denies any recent changes in medication and does not take any NSAID.  He has not had any prior history of pancreatitis before.  Patient did incidentally note that he has lost about 10 pounds in the last 2 to 3 weeks without trying.  They deny any history of alcohol consumption or smoking.  He is followed by Dr. Marina Goodell for Elita Boone cirrhosis which is compensated.  At [facility] patient was noted to be afebrile with stable vital signs.  Labs significant for lipase 2066, high-sensitivity troponins negative x 2, creatinine 1.4, and glucose 242.  EKG did not note any significant ischemic changes.  CT scan of the chest abdomen and pelvis have been obtained, but did not note any acute findings to explain the patient's symptoms.  [facility] had been consulted.  Patient was started on normal saline IV fluids at 100  mL/h.  Review of Systems: As mentioned in the history of present illness. All other systems reviewed and are negative. Past Medical History:  Diagnosis Date   Abnormal cardiac CT angiography    Acid reflux    Annual physical exam 04/08/2015   Arthritis    Atypical chest pain 06/13/2020   Blood transfusion without reported diagnosis    Body mass index (BMI) 35.0-35.9, adult 04/05/2019   Chronic fatigue 01/28/2015   Chronic headaches    on cymbalta   Chronic kidney disease, stage 3b (HCC) 07/14/2023   Chronic migraine w/o aura, not intractable, w/o stat migr 10/24/2018   Cirrhosis (HCC)    Colon polyps    Complication of anesthesia    problems waking up from anesthesia   Coronary artery disease 01/23/2019   Depression    on cymbalta   Diabetes mellitus with neuropathy (HCC)    Diabetes with neuropathy 04/25/2013   Diverticulitis 03/2013   Dyslipidemia 05/29/2019   Eczema    Elevated LFTs    Epidural lipomatosis 10/05/2018   Essential (primary) hypertension 04/05/2019   Essential hypertension 10/10/2019   Fatty liver    GERD (gastroesophageal reflux disease) 04/28/2011   H/O craniotomy 05/07/2015   Headache 04/28/2011   Hepatitis 10/2017   NASH cirrhosis   History of kidney stones    Hyperlipidemia    Hypersomnia with sleep apnea 01/28/2015   Hypertension    IDA (iron deficiency anemia) 01/24/2019   Idiopathic intracranial hypertension 01/14/2017   Insomnia 04/26/2013   Kidney stone  Liver cirrhosis secondary to NASH (nonalcoholic steatohepatitis) (HCC) 01/02/2016   Low back pain 04/05/2019   Lower back injury 08/14/2019   Morbid obesity (HCC)    Neuromuscular disorder (HCC)    neuropathy   Neuropathy    Nonalcoholic steatohepatitis 10/05/2018   Obstructive hydrocephalus (HCC) 01/28/2015   OSA -- dx ~ 2012, cpap intolerant 09/04/2014    dx ~ 2012, cpap intolerant    PCP NOTES >>> 04/08/2015   Post-op pain 03/19/2019   Post-traumatic hydrocephalus (HCC)     s/p shunts x 2 (first got infected )   Presence of cerebrospinal fluid drainage device 07/28/2011   Psoriasis    sees Dr Lenis Noon   Psoriatic arthritis (HCC)    REM behavioral disorder 01/14/2017   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   see's Dr.Ortmann   Severe obesity (BMI >= 40) (HCC) 01/28/2015   SI (sacroiliac) joint dysfunction 08/14/2019   Sigmoid diverticulitis 04/25/2013   Sleep apnea    no CPAP      Spondylolisthesis, lumbar region 03/16/2019   Stomach ulcer    Testosterone deficiency 04/28/2011   VP (ventriculoperitoneal) shunt status 07/31/2020   Past Surgical History:  Procedure Laterality Date   AMPUTATION Left 12/27/2021   Procedure: AMPUTATION GREAT TOE;  Surgeon: Vivi Barrack, DPM;  Location: [facility] OR;  Service: Podiatry;  Laterality: Left;   BACK SURGERY  1980   BRAIN SURGERY     VP shunts placed in 2007   CHOLECYSTECTOMY N/A 08/25/2017   Procedure: LAPAROSCOPIC CHOLECYSTECTOMY WITH INTRAOPERATIVE CHOLANGIOGRAM;  Surgeon: Griselda Miner, MD;  Location: [facility] [place] OR;  Service: General;  Laterality: N/A;   COLONOSCOPY     CORONARY STENT INTERVENTION N/A 09/27/2020   Procedure: CORONARY STENT INTERVENTION;  Surgeon: Corky Crafts, MD;  Location: [facility] INVASIVE CV LAB;  Service: Cardiovascular;  Laterality: N/A;   CORONARY ULTRASOUND/IVUS N/A 09/27/2020   Procedure: Intravascular Ultrasound/IVUS;  Surgeon: Corky Crafts, MD;  Location: [facility] INVASIVE CV LAB;  Service: Cardiovascular;  Laterality: N/A;   JOINT REPLACEMENT     total hip   LEFT HEART CATH N/A 09/27/2020   Procedure: Left Heart Cath;  Surgeon: Corky Crafts, MD;  Location: [facility] INVASIVE CV LAB;  Service: Cardiovascular;  Laterality: N/A;   LEFT HEART CATH AND CORONARY ANGIOGRAPHY N/A 09/24/2020   Procedure: LEFT HEART CATH AND CORONARY ANGIOGRAPHY;  Surgeon: Corky Crafts, MD;  Location: [facility] INVASIVE CV LAB;  Service: Cardiovascular;  Laterality: N/A;   LEFT HEART CATH AND CORONARY  ANGIOGRAPHY N/A 08/18/2021   Procedure: LEFT HEART CATH AND CORONARY ANGIOGRAPHY;  Surgeon: Lennette Bihari, MD;  Location: [facility] INVASIVE CV LAB;  Service: Cardiovascular;  Laterality: N/A;   LUMBAR FUSION  03/16/2019   SHOULDER SURGERY Left 2010   TEE WITHOUT CARDIOVERSION N/A 01/01/2022   Procedure: TRANSESOPHAGEAL ECHOCARDIOGRAM (TEE);  Surgeon: Thomasene Ripple, DO;  Location: [facility] ENDOSCOPY;  Service: Cardiovascular;  Laterality: N/A;   TOE SURGERY Left 2018   TONSILLECTOMY     as a child   TOTAL HIP ARTHROPLASTY Left 2011   UPPER GASTROINTESTINAL ENDOSCOPY  01/04/2020   VENTRICULOPERITONEAL SHUNT  2007   x2   Social History:  reports that he has never smoked. He has never used smokeless tobacco. He reports that he does not drink alcohol and does not use drugs.  Allergies  Allergen Reactions   Bee Venom Anaphylaxis   Hydrocodone Bit-Homatrop Mbr Other (See Comments)    Hallucinations, confusion, delirium Depressed feeling   Toradol [  Ketorolac Tromethamine] Other (See Comments)    Hallucinations, confusion, delirium   Prednisone     Patient reports it causes cirrhosis to flare up   Sulfadiazine     NDC [id number] NDC [id number] NDC [id number]:[id number]   Morphine And Codeine Other (See Comments)    Hallucinations, back in the 80s. States has taken vicodin before w/o problems    Sulfa Drugs Cross Reactors Rash    Family History  Problem Relation Age of Onset   Other Mother    Lung cancer Father        alive, former smoker    Heart disease Brother        MI age 72   Other Brother        Murdered   Down syndrome Son    Diabetes Neg Hx    Prostate cancer Neg Hx    Colon cancer Neg Hx    Stomach cancer Neg Hx    Pancreatic cancer Neg Hx    Liver disease Neg Hx     Prior to Admission medications   Medication Sig Start Date End Date Taking? Authorizing Provider  acetaminophen (TYLENOL) 325 MG tablet Take 2 tablets (650 mg total) by mouth every 4 (four) hours as needed  for headache or mild pain. 08/18/21   Leone Brand, NP  adalimumab (HUMIRA, 2 PEN,) 40 MG/0.8ML AJKT pen Inject 40mg  (0.80ml) under the skin every other week. Patient taking differently: Inject 40 mg into the skin every 14 (fourteen) days. 04/01/23   Quentin Angst, MD  amLODipine (NORVASC) 5 MG tablet Take 1 tablet (5 mg total) by mouth daily. 12/29/22   Georgeanna Lea, MD  aspirin 81 MG chewable tablet Chew 1 tablet (81 mg total) by mouth daily. 08/19/21   Leone Brand, NP  atorvastatin (LIPITOR) 80 MG tablet Take 1 tablet (80 mg total) by mouth at bedtime. 03/24/23   Wanda Plump, MD  botulinum toxin Type A (BOTOX) 200 units injection Provider to inject 155 units into the muscles of the head and neck every 3 months. Discard remainder. Patient taking differently: Inject 155 Units into the muscle every 3 (three) months. Provider to inject 155 units into the muscles of the head and neck every 3 months. Discard remainder. 10/12/22   Sater, Pearletha Furl, MD  carvedilol (COREG) 12.5 MG tablet Take 1 tablet (12.5 mg total) by mouth 2 (two) times daily with a meal. 04/23/23   Paz, Nolon Rod, MD  clobetasol (TEMOVATE) 0.05 % external solution Apply 1 application externally twice a day 14 days. Patient taking differently: Apply 1 Application topically 2 (two) times daily. 10/01/22     clonazePAM (KLONOPIN) 0.5 MG tablet Take 1 tablet (0.5 mg total) by mouth at bedtime. 03/29/23 09/25/23  Sater, Pearletha Furl, MD  Continuous Glucose Sensor (DEXCOM G6 SENSOR) MISC Use to monitor blood sugar, change after 10 days 07/16/23   Thapa, [place], MD  Continuous Glucose Transmitter (DEXCOM G6 TRANSMITTER) MISC 1 Device by Does not apply route continuous. 07/16/23   Thapa, [place], MD  dicyclomine (BENTYL) 20 MG tablet Take 1 tablet (20 mg total) by mouth 2 (two) times daily as needed. Patient taking differently: Take 20 mg by mouth 2 (two) times daily as needed for spasms. 02/12/23   Gowens, Mariah L, PA-C  DULoxetine (CYMBALTA)  60 MG capsule Take 2 capsules (120 mg total) by mouth daily. 03/24/23   Wanda Plump, MD  empagliflozin (JARDIANCE) 10 MG TABS tablet Take 1 tablet (  10 mg total) by mouth daily with breakfast. 08/02/23   Thapa, [place], MD  EPINEPHrine (EPIPEN 2-PAK) 0.3 mg/0.3 mL IJ SOAJ injection Inject 0.3 mLs (0.3 mg total) into the muscle once as needed for up to 1 dose for anaphylaxis. 01/10/20   Wanda Plump, MD  fenofibrate micronized (LOFIBRA) 134 MG capsule Take 1 capsule (134 mg total) by mouth 3 (three) times a week. 12/02/22 12/02/23  Reather Littler, MD  gabapentin (NEURONTIN) 600 MG tablet Take 1 tablet (600 mg total) by mouth 2 (two) times daily. 04/09/23   Wanda Plump, MD  Insulin Disposable Pump (OMNIPOD 5 G6 PODS, GEN 5,) MISC Use as directed every 3 (three) days. 09/09/22   Reather Littler, MD  insulin lispro (HUMALOG) 100 UNIT/ML injection Use up to 60 Units daily in pump Patient taking differently: Inject 60 Units into the skin See admin instructions. Use up to 60 Units daily in pump 10/07/22   Reather Littler, MD  Insulin Syringe-Needle U-100 (INSULIN SYRINGE 1CC/31GX5/16") 31G X 5/16" 1 ML MISC Use to administer Humalog 3 times a day Patient taking differently: 1 tablet by Other route See admin instructions. Use to administer Humalog 3 times a day 10/07/22   Reather Littler, MD  isosorbide mononitrate (IMDUR) 120 MG 24 hr tablet Take 1 tablet (120 mg total) by mouth daily. 07/14/22   Georgeanna Lea, MD  metFORMIN (GLUCOPHAGE) 500 MG tablet Take 1 tablet (500 mg total) by mouth 2 (two) times daily. 04/16/23   Thapa, [place], MD  nitroGLYCERIN (NITROSTAT) 0.4 MG SL tablet Place 1 tablet (0.4 mg total) under the tongue every 5 (five) minutes for 3 doses as needed for chest pain. 06/26/21   Wanda Plump, MD  ondansetron (ZOFRAN) 4 MG tablet Take 1 tablet (4 mg total) by mouth every 8 (eight) hours as needed. Patient taking differently: Take 4 mg by mouth every 8 (eight) hours as needed for nausea or vomiting. 02/12/23   Gowens,  Mariah L, PA-C  pantoprazole (PROTONIX) 40 MG tablet Take 1 tablet (40 mg total) by mouth daily. 03/16/23   Wanda Plump, MD  traMADol (ULTRAM) 50 MG tablet Take 1 tablet (50 mg total) by mouth every 6 (six) hours as needed for up to 10 doses for severe pain. 10/18/22   Mardene Sayer, MD    Physical Exam: Vitals:   08/19/23 1255 08/19/23 1415 08/19/23 1430 08/19/23 1537  BP: 117/83 (!) 137/90 (!) 130/90 130/85  Pulse: 80 72 76 73  Resp: 20 19 (!) 21 18  Temp: 97.6 F (36.4 C)   97.9 F (36.6 C)  TempSrc: Oral   Oral  SpO2: 92% 92% 94% 94%  Weight:      Height:        Constitutional: Older adult male currently NAD, calm, comfortable Eyes: PERRL, lids and conjunctivae normal ENMT: Mucous membranes are moist.  Normal dentition.  Neck: normal, supple  Respiratory: clear to auscultation bilaterally, no wheezing, no crackles. Normal respiratory effort. No accessory muscle use.  Cardiovascular: Regular rate and rhythm, no murmurs / rubs / gallops. No extremity edema. 2+ pedal pulses.    Abdomen: Mild tenderness palpation epigastrically and to the right upper quadrant.  No guarding.  No fluid wave appreciated.  Bowel sounds present all 4 quadrants Musculoskeletal: no clubbing / cyanosis. No joint deformity upper and lower extremities. Good ROM, no contractures. Normal muscle tone.  Skin: no rashes, lesions, ulcers. No induration Neurologic: CN 2-12 grossly intact. . Strength 5/5  in all 4.  Psychiatric: Normal judgment and insight. Alert and oriented x 3. Normal mood.   Data Reviewed:  EKG reveals sinus rhythm at 82 bpm without significant ischemic changes.  Reviewed labs, imaging, and pertinent records as documented.  Assessment and Plan:  Right upper quadrant/epigastric abdominal pain Elevated lipase Patient presents with complaints of a 4-day history of right upper quadrant and epigastric abdominal pain.  Denies having any nausea or vomiting symptoms.  Denies any significant  NSAID use.  He is status post cholecystectomy and LFTs were otherwise noted to be within normal limits.  Lipase was elevated at 2066.  CT scan of the abdomen pelvis did not note any acute abnormality as a cause for patient's symptoms.  Records note he did have an EGD and colonoscopy back in 12/2022.  EGD noted trace esophageal varix with mild portal hypertensive gastropathy, but was otherwise unremarkable.  Question if pancreatitis is the cause for patient's symptoms with negative CT imaging. -Admit to a medical telemetry bed -Diet as tolerated -Check triglyceride level -Check lipase, AFP marker, IgG4 level, and CMP in a.m. -Tramadol/Fentanyl IV as needed for mild to moderate pain -Appreciate GI consultative services we will follow-up for any further recommendations  Uncontrolled diabetes mellitus type 2 with hyperglycemia On admission glucose elevated at 242.  Last hemoglobin A1c was 7.9 when checked on 07/16/2003.  Patient reports that he had not been on his insulin pump in the last week or so. -Hypoglycemic protocols -Hold metformin -Continue Jardiance and gabapentin -CBGs before every meal with moderate SSI -Adjust insulin regimen as needed  Essential hypertension Blood pressures currently maintained. -Continue home blood pressure regimen as tolerated  Weight loss Patient reports losing approximately 10 pounds in the last couple weeks without trying.  Possibly secondary to diabetes being uncontrolled as he has not been on his insulin pump. -Check TSH  Coronary artery disease Patient with prior history of PTCA of the LAD in 09/2020. -Continue aspirin and statin  NASH cirrhosis Patient denies any history of significant alcohol use and appears to be compensated.  Is followed by Dr. Marina Goodell in the outpatient setting.  Hyperlipidemia -Continue atorvastatin  GERD -Continue Protonix  Obesity BMI 31.38 kg/m   DVT prophylaxis: Lovenox Advance Care Planning:   Code Status: Prior     Consults: [facility]  Family Communication: None  Severity of Illness: The appropriate patient status for this patient is OBSERVATION. Observation status is judged to be reasonable and necessary in order to provide the required intensity of service to ensure the patient's safety. The patient's presenting symptoms, physical exam findings, and initial radiographic and laboratory data in the context of their medical condition is felt to place them at decreased risk for further clinical deterioration. Furthermore, it is anticipated that the patient will be medically stable for discharge from the hospital within 2 midnights of admission.   Author: Clydie Braun, MD 08/19/2023 3:44 PM  For on call review [email or web address].

## 2023-08-19 NOTE — Plan of Care (Signed)
Transfer from [facility]  Mr. Alwin is a 68 year old male with pmh of hypertension, hyperlipidemia, CAD, DM type II, CKD stage III, cirrhosis, obesity, and OSA presents with complaints of abdominal pain.  Labs significant for lipase 2066 and high-sensitivity troponins negative x 2.  EKG without significant ischemic changes.  [facility] was consulted.  Patient had been started on normal saline IV fluids at 100 mL/h accepted to a medical telemetry bed

## 2023-08-19 NOTE — Plan of Care (Signed)

## 2023-08-19 NOTE — ED Triage Notes (Signed)
Reports mid chest, epigastric and lower R abd pain for 4 days. States pain is worse when taking a deep breath. C/o headache  Denies NVD Hx of MI

## 2023-08-20 ENCOUNTER — Other Ambulatory Visit: Payer: Self-pay

## 2023-08-20 DIAGNOSIS — R1033 Periumbilical pain: Secondary | ICD-10-CM | POA: Diagnosis not present

## 2023-08-20 DIAGNOSIS — N281 Cyst of kidney, acquired: Secondary | ICD-10-CM | POA: Diagnosis not present

## 2023-08-20 DIAGNOSIS — Z7984 Long term (current) use of oral hypoglycemic drugs: Secondary | ICD-10-CM | POA: Diagnosis not present

## 2023-08-20 DIAGNOSIS — N1832 Chronic kidney disease, stage 3b: Secondary | ICD-10-CM | POA: Diagnosis not present

## 2023-08-20 DIAGNOSIS — L4050 Arthropathic psoriasis, unspecified: Secondary | ICD-10-CM | POA: Diagnosis not present

## 2023-08-20 DIAGNOSIS — R1013 Epigastric pain: Secondary | ICD-10-CM | POA: Diagnosis not present

## 2023-08-20 DIAGNOSIS — E1165 Type 2 diabetes mellitus with hyperglycemia: Secondary | ICD-10-CM | POA: Diagnosis not present

## 2023-08-20 DIAGNOSIS — E785 Hyperlipidemia, unspecified: Secondary | ICD-10-CM | POA: Diagnosis not present

## 2023-08-20 DIAGNOSIS — E1122 Type 2 diabetes mellitus with diabetic chronic kidney disease: Secondary | ICD-10-CM | POA: Diagnosis not present

## 2023-08-20 DIAGNOSIS — I25119 Atherosclerotic heart disease of native coronary artery with unspecified angina pectoris: Secondary | ICD-10-CM | POA: Diagnosis not present

## 2023-08-20 DIAGNOSIS — R161 Splenomegaly, not elsewhere classified: Secondary | ICD-10-CM | POA: Diagnosis not present

## 2023-08-20 DIAGNOSIS — Z982 Presence of cerebrospinal fluid drainage device: Secondary | ICD-10-CM | POA: Diagnosis not present

## 2023-08-20 DIAGNOSIS — K5730 Diverticulosis of large intestine without perforation or abscess without bleeding: Secondary | ICD-10-CM | POA: Diagnosis not present

## 2023-08-20 DIAGNOSIS — R072 Precordial pain: Secondary | ICD-10-CM | POA: Diagnosis not present

## 2023-08-20 DIAGNOSIS — R748 Abnormal levels of other serum enzymes: Secondary | ICD-10-CM | POA: Diagnosis not present

## 2023-08-20 DIAGNOSIS — R071 Chest pain on breathing: Secondary | ICD-10-CM | POA: Diagnosis not present

## 2023-08-20 DIAGNOSIS — I2510 Atherosclerotic heart disease of native coronary artery without angina pectoris: Secondary | ICD-10-CM | POA: Diagnosis not present

## 2023-08-20 DIAGNOSIS — F32A Depression, unspecified: Secondary | ICD-10-CM | POA: Diagnosis not present

## 2023-08-20 DIAGNOSIS — K7581 Nonalcoholic steatohepatitis (NASH): Secondary | ICD-10-CM | POA: Diagnosis not present

## 2023-08-20 DIAGNOSIS — R634 Abnormal weight loss: Secondary | ICD-10-CM | POA: Diagnosis not present

## 2023-08-20 DIAGNOSIS — K8500 Idiopathic acute pancreatitis without necrosis or infection: Secondary | ICD-10-CM | POA: Diagnosis not present

## 2023-08-20 DIAGNOSIS — Z8249 Family history of ischemic heart disease and other diseases of the circulatory system: Secondary | ICD-10-CM | POA: Diagnosis not present

## 2023-08-20 DIAGNOSIS — E1140 Type 2 diabetes mellitus with diabetic neuropathy, unspecified: Secondary | ICD-10-CM | POA: Diagnosis not present

## 2023-08-20 DIAGNOSIS — Z79899 Other long term (current) drug therapy: Secondary | ICD-10-CM | POA: Diagnosis not present

## 2023-08-20 DIAGNOSIS — Z955 Presence of coronary angioplasty implant and graft: Secondary | ICD-10-CM | POA: Diagnosis not present

## 2023-08-20 DIAGNOSIS — K7460 Unspecified cirrhosis of liver: Secondary | ICD-10-CM | POA: Diagnosis not present

## 2023-08-20 DIAGNOSIS — Z981 Arthrodesis status: Secondary | ICD-10-CM | POA: Diagnosis not present

## 2023-08-20 DIAGNOSIS — Z96642 Presence of left artificial hip joint: Secondary | ICD-10-CM | POA: Diagnosis not present

## 2023-08-20 DIAGNOSIS — R079 Chest pain, unspecified: Secondary | ICD-10-CM | POA: Diagnosis not present

## 2023-08-20 DIAGNOSIS — K7469 Other cirrhosis of liver: Secondary | ICD-10-CM | POA: Diagnosis not present

## 2023-08-20 DIAGNOSIS — Z794 Long term (current) use of insulin: Secondary | ICD-10-CM | POA: Diagnosis not present

## 2023-08-20 DIAGNOSIS — K859 Acute pancreatitis without necrosis or infection, unspecified: Secondary | ICD-10-CM | POA: Diagnosis not present

## 2023-08-20 DIAGNOSIS — I129 Hypertensive chronic kidney disease with stage 1 through stage 4 chronic kidney disease, or unspecified chronic kidney disease: Secondary | ICD-10-CM | POA: Diagnosis not present

## 2023-08-20 DIAGNOSIS — Z6831 Body mass index (BMI) 31.0-31.9, adult: Secondary | ICD-10-CM | POA: Diagnosis not present

## 2023-08-20 DIAGNOSIS — K219 Gastro-esophageal reflux disease without esophagitis: Secondary | ICD-10-CM | POA: Diagnosis not present

## 2023-08-20 LAB — URINALYSIS, ROUTINE W REFLEX MICROSCOPIC
Bilirubin Urine: NEGATIVE
Glucose, UA: >=500 mg/dL — AB
Hgb urine dipstick: NEGATIVE
Ketones, ur: NEGATIVE mg/dL
Leukocytes,Ua: NEGATIVE
Nitrite: NEGATIVE
Protein, ur: NEGATIVE mg/dL
Specific Gravity, Urine: 1.028 (ref 1.005–1.030)
pH: 5 (ref 5.0–8.0)

## 2023-08-20 LAB — COMPREHENSIVE METABOLIC PANEL
ALT: 22 U/L (ref 0–44)
AST: 14 U/L — ABNORMAL LOW (ref 15–41)
Albumin: 2.6 g/dL — ABNORMAL LOW (ref 3.5–5.0)
Alkaline Phosphatase: 97 U/L (ref 38–126)
Anion gap: 10 (ref 5–15)
BUN: 14 mg/dL (ref 8–23)
CO2: 21 mmol/L — ABNORMAL LOW (ref 22–32)
Calcium: 8.5 mg/dL — ABNORMAL LOW (ref 8.9–10.3)
Chloride: 106 mmol/L (ref 98–111)
Creatinine, Ser: 1.08 mg/dL (ref 0.61–1.24)
GFR, Estimated: >60 mL/min (ref >60)
Glucose, Bld: 144 mg/dL — ABNORMAL HIGH (ref 70–99)
Potassium: 4.1 mmol/L (ref 3.5–5.1)
Sodium: 137 mmol/L (ref 135–145)
Total Bilirubin: 1 mg/dL (ref 0.0–1.2)
Total Protein: 6.4 g/dL — ABNORMAL LOW (ref 6.5–8.1)

## 2023-08-20 LAB — CBC
HCT: 37.9 % — ABNORMAL LOW (ref 39.0–52.0)
Hemoglobin: 12.5 g/dL — ABNORMAL LOW (ref 13.0–17.0)
MCH: 27.8 pg (ref 26.0–34.0)
MCHC: 33 g/dL (ref 30.0–36.0)
MCV: 84.2 fL (ref 80.0–100.0)
Platelets: 177 10*3/uL (ref 150–400)
RBC: 4.5 MIL/uL (ref 4.22–5.81)
RDW: 16.9 % — ABNORMAL HIGH (ref 11.5–15.5)
WBC: 5.7 10*3/uL (ref 4.0–10.5)
nRBC: 0 % (ref 0.0–0.2)

## 2023-08-20 LAB — GLUCOSE, CAPILLARY
Glucose-Capillary: 136 mg/dL — ABNORMAL HIGH (ref 70–99)
Glucose-Capillary: 165 mg/dL — ABNORMAL HIGH (ref 70–99)
Glucose-Capillary: 172 mg/dL — ABNORMAL HIGH (ref 70–99)
Glucose-Capillary: 262 mg/dL — ABNORMAL HIGH (ref 70–99)

## 2023-08-20 LAB — PROTIME-INR
INR: 1.2 (ref 0.8–1.2)
Prothrombin Time: 14.9 s (ref 11.4–15.2)

## 2023-08-20 LAB — TRIGLYCERIDES: Triglycerides: 145 mg/dL (ref <150)

## 2023-08-20 LAB — LIPASE, BLOOD: Lipase: 886 U/L — ABNORMAL HIGH (ref 11–51)

## 2023-08-20 NOTE — Plan of Care (Signed)
  Problem: Clinical Measurements: Goal: Will remain free from infection Outcome: Progressing   Problem: Coping: Goal: Level of anxiety will decrease Outcome: Progressing   Problem: Skin Integrity: Goal: Risk for impaired skin integrity will decrease Outcome: Progressing   Problem: Safety: Goal: Ability to remain free from injury will improve Outcome: Progressing   Problem: Coping: Goal: Ability to adjust to condition or change in health will improve Outcome: Progressing
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
## 2023-08-20 NOTE — Progress Notes (Addendum)
PROGRESS NOTE  James [name] YQM:[id number] DOB: August 09, 1955 DOA: 08/19/2023 PCP: Wanda Plump, MD   LOS: 0 days   Brief narrative:   James [name] is a 68 y.o. male with past medical history significant of hypertension, hyperlipidemia, CAD s/p PTCA of the LAD in 2022, diabetes mellitus type 2, CKD stage IIIa, NASH cirrhosis, psoriatic arthritis, obesity, and OSA intolerant of CPAP presented to hospital with abdominal pain for 4 days localized to the right upper quadrant without any fever nausea vomiting.  Initially presented to med [facility] and patient was noted to be afebrile.  Labs showed elevated lipase, elevated creatinine at 1.4.  EKG unremarkable.   CT scan of the chest abdomen and pelvis did not show acute findings.  GI was consulted and patient was admitted hospital for further evaluation and treatment..   Assessment/Plan: Principal Problem:   Epigastric abdominal pain Active Problems:   Elevated lipase   Uncontrolled type 2 diabetes mellitus with hypoglycemia, with long-term current use of insulin (HCC)   Essential hypertension   Weight loss   Coronary artery disease   Liver cirrhosis secondary to NASH (nonalcoholic steatohepatitis) (HCC)   Hyperlipidemia   GERD (gastroesophageal reflux disease)   Obesity (BMI 30-39.9)  Right upper quadrant/epigastric abdominal pain likely acute pancreatitis NASH cirrhosis.  status post cholecystectomy and LFTs were normal.  Lipase was elevated at 2066.  CT scan of the abdomen pelvis did not note any acute abnormality except for cirrhosis of liver.  Follows up with GI Dr. Marina Goodell as outpatient.  History of EGD and colonoscopy back in 12/2022.  EGD noted trace esophageal varix with mild portal hypertensive gastropathy.  Lipase trending down today.  Triglyceride level was 145.   TSH of 1.6, HIV nonreactive.  Continue conservative treatment for pancreatitis. AFP IgG4 has been sent.  Will follow GI recommendation.  Patient states that he  still continues to have the similar pain when he came in.  He was able to tolerate oral diet however.  Will continue to monitor today.  Follow GI recommendation.  Unable to tolerate morphine and Norco.  Currently on tramadol.  Continue IV hydration.   Uncontrolled diabetes mellitus type 2 with hyperglycemia   Last hemoglobin A1c was 7.9 on 07/16/2003.  Was on insulin pump which he has not been using it.  On metformin Jardiance at home.  Metformin is on hold.  Continue insulin regimen.    Essential hypertension On amlodipine Coreg Imdur at home.  Has been resumed.  Weight loss TSH within normal limits   Coronary artery disease Patient with prior history of PTCA of the LAD in 09/2020. -Continue aspirin and statin   Hyperlipidemia -Continue atorvastatin   GERD -Continue Protonix   Class I obesity BMI 31.38 kg/m.  Would benefit from weight loss outpatient  History of depression. Continue Cymbalta.   DVT prophylaxis: enoxaparin (LOVENOX) injection 40 mg Start: 08/19/23 1700   Disposition: Home likely in 1 to 2 days  Status is: Observation The patient will require care spanning > 2 midnights and should be moved to inpatient because: Pending clinical improvement, persistent pain    Code Status:     Code Status: Full Code  Family Communication: None  Consultants: GI  Procedures: None  Anti-infectives:  None  Anti-infectives (From admission, onward)    None       Subjective: Today, patient was seen and examined at bedside.  Still complains of similar pain when he came to the hospital to  was able to tolerate oral no nausea vomiting and had a bowel movement.  Denies any fever chills or shortness of breath.  Objective: Vitals:   08/20/23 0757 08/20/23 0956  BP: 117/85 110/77  Pulse: 83   Resp: 18   Temp: 98.1 F (36.7 C)   SpO2: 90%    No intake or output data in the 24 hours ending 08/20/23 1004 Filed Weights   08/19/23 0826  Weight: 102.1 kg   Body  mass index is 31.38 kg/m.   Physical Exam: GENERAL: Patient is alert awake and oriented. Not in obvious distress. HENT: No scleral pallor or icterus. Pupils equally reactive to light. Oral mucosa is moist NECK: is supple, no gross swelling noted. CHEST: Clear to auscultation. No crackles or wheezes.  Diminished breath sounds bilaterally. CVS: S1 and S2 heard, no murmur. Regular rate and rhythm.  ABDOMEN: Soft, non-tender, bowel sounds are present. EXTREMITIES: No edema. CNS: Cranial nerves are intact. No focal motor deficits. SKIN: warm and dry without rashes.  Data Review: I have personally reviewed the following laboratory data and studies,  CBC: Recent Labs  Lab 08/19/23 0829 08/20/23 0529  WBC 6.9 5.7  NEUTROABS 4.8  --   HGB 13.6 12.5*  HCT 42.0 37.9*  MCV 85.4 84.2  PLT 190 177   Basic Metabolic Panel: Recent Labs  Lab 08/19/23 0829 08/20/23 0529  NA 136 137  K 4.2 4.1  CL 102 106  CO2 23 21*  GLUCOSE 242* 144*  BUN 19 14  CREATININE 1.40* 1.08  CALCIUM 8.9 8.5*   Liver Function Tests: Recent Labs  Lab 08/19/23 0829 08/20/23 0529  AST 20 14*  ALT 27 22  ALKPHOS 116 97  BILITOT 1.0 1.0  PROT 8.0 6.4*  ALBUMIN 3.3* 2.6*   Recent Labs  Lab 08/19/23 0842 08/20/23 0529  LIPASE 2,066* 886*   No results for input(s): "AMMONIA" in the last 168 hours. Cardiac Enzymes: No results for input(s): "CKTOTAL", "CKMB", "CKMBINDEX", "TROPONINI" in the last 168 hours. BNP (last 3 results) No results for input(s): "BNP" in the last 8760 hours.  ProBNP (last 3 results) No results for input(s): "PROBNP" in the last 8760 hours.  CBG: Recent Labs  Lab 08/19/23 1548 08/19/23 2109 08/20/23 0758  GLUCAP 151* 182* 136*   No results found for this or any previous visit (from the past 240 hours).   Studies: CT Angio Chest PE W/Cm &/Or Wo Cm Result Date: 08/19/2023 CLINICAL DATA:  Chest and abdominal pain, right lower abdominal pain for 4 days EXAM: CT  ANGIOGRAPHY CHEST CT ABDOMEN AND PELVIS WITH CONTRAST TECHNIQUE: Multidetector CT imaging of the chest was performed using the standard protocol during bolus administration of intravenous contrast. Multiplanar CT image reconstructions and MIPs were obtained to evaluate the vascular anatomy. Multidetector CT imaging of the abdomen and pelvis was performed using the standard protocol during bolus administration of intravenous contrast. RADIATION DOSE REDUCTION: This exam was performed according to the departmental dose-optimization program which includes automated exposure control, adjustment of the mA and/or kV according to patient size and/or use of iterative reconstruction technique. CONTRAST:  OMNIPAQUE IOHEXOL 350 MG/ML SOLN COMPARISON:  None Available. FINDINGS: CT CHEST ANGIOGRAM FINDINGS Cardiovascular: Satisfactory opacification of the pulmonary arteries to the segmental level. No evidence of pulmonary embolism. Normal heart size. Left and right coronary artery calcifications. No pericardial effusion. Scattered aortic atherosclerosis. Mediastinum/Nodes: No enlarged mediastinal, hilar, or axillary lymph nodes. Thyroid gland, trachea, and esophagus demonstrate no significant findings. Lungs/Pleura:  Lungs are clear. No pleural effusion or pneumothorax. Musculoskeletal: No chest wall abnormality. No acute osseous findings. Review of the MIP images confirms the above findings. CT ABDOMEN PELVIS FINDINGS Hepatobiliary: No solid liver abnormality is seen. Coarse, nodular cirrhotic morphology of the liver. Status post status post cholecystectomy. No biliary ductal dilatation. Pancreas: Unremarkable. No pancreatic ductal dilatation or surrounding inflammatory changes. Spleen: Splenomegaly, maximum span 16.1 cm. Adrenals/Urinary Tract: Adrenal glands are unremarkable. Small nonobstructive calculus of the midportion of the right kidney. No left-sided calculi, ureteral calculi, or hydronephrosis. Probable small  angiomyolipoma of the posterosuperior pole of the left kidney as well as simple, benign tiny renal cortical cysts, all requiring no further follow-up or characterization. Bladder is unremarkable. Stomach/Bowel: Stomach is within normal limits. Appendix appears normal. No evidence of bowel wall thickening, distention, or inflammatory changes. Sigmoid diverticulosis. Vascular/Lymphatic: Aortic atherosclerosis. No enlarged abdominal or pelvic lymph nodes. Reproductive: No mass or other significant abnormality. Other: No abdominal wall hernia or abnormality. No ascites. Shunt catheter tubing, tip positioned in the left lower quadrant (series 306, image 69). Musculoskeletal: No acute or significant osseous findings. Status post left hip total arthroplasty. IMPRESSION: 1. Negative examination for pulmonary embolism. 2. No acute CT findings of the chest, abdomen, or pelvis to explain right lower quadrant pain. 3. Cirrhosis. 4. Splenomegaly. 5. Small nonobstructive calculus of the midportion of the right kidney. No left-sided calculi, ureteral calculi, or hydronephrosis. 6. Sigmoid diverticulosis without evidence of acute diverticulitis. 7. Coronary artery disease. Aortic Atherosclerosis (ICD10-I70.0). Electronically Signed   By: Jearld Lesch M.D.   On: 08/19/2023 10:46   CT ABDOMEN PELVIS W CONTRAST Result Date: 08/19/2023 CLINICAL DATA:  Chest and abdominal pain, right lower abdominal pain for 4 days EXAM: CT ANGIOGRAPHY CHEST CT ABDOMEN AND PELVIS WITH CONTRAST TECHNIQUE: Multidetector CT imaging of the chest was performed using the standard protocol during bolus administration of intravenous contrast. Multiplanar CT image reconstructions and MIPs were obtained to evaluate the vascular anatomy. Multidetector CT imaging of the abdomen and pelvis was performed using the standard protocol during bolus administration of intravenous contrast. RADIATION DOSE REDUCTION: This exam was performed according to the departmental  dose-optimization program which includes automated exposure control, adjustment of the mA and/or kV according to patient size and/or use of iterative reconstruction technique. CONTRAST:  OMNIPAQUE IOHEXOL 350 MG/ML SOLN COMPARISON:  None Available. FINDINGS: CT CHEST ANGIOGRAM FINDINGS Cardiovascular: Satisfactory opacification of the pulmonary arteries to the segmental level. No evidence of pulmonary embolism. Normal heart size. Left and right coronary artery calcifications. No pericardial effusion. Scattered aortic atherosclerosis. Mediastinum/Nodes: No enlarged mediastinal, hilar, or axillary lymph nodes. Thyroid gland, trachea, and esophagus demonstrate no significant findings. Lungs/Pleura: Lungs are clear. No pleural effusion or pneumothorax. Musculoskeletal: No chest wall abnormality. No acute osseous findings. Review of the MIP images confirms the above findings. CT ABDOMEN PELVIS FINDINGS Hepatobiliary: No solid liver abnormality is seen. Coarse, nodular cirrhotic morphology of the liver. Status post status post cholecystectomy. No biliary ductal dilatation. Pancreas: Unremarkable. No pancreatic ductal dilatation or surrounding inflammatory changes. Spleen: Splenomegaly, maximum span 16.1 cm. Adrenals/Urinary Tract: Adrenal glands are unremarkable. Small nonobstructive calculus of the midportion of the right kidney. No left-sided calculi, ureteral calculi, or hydronephrosis. 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 Bladder is unremarkable. Stomach/Bowel: Stomach is within normal limits. Appendix appears normal. No evidence of bowel wall thickening, distention, or inflammatory  changes. Sigmoid diverticulosis. Vascular/Lymphatic: Aortic atherosclerosis. No enlarged abdominal or pelvic lymph nodes. Reproductive: No mass or other significant abnormality. Other:  No abdominal wall hernia or abnormality. No ascites. Shunt catheter tubing, tip positioned in the left lower quadrant (series 306, image 69). Musculoskeletal: No acute or significant osseous findings. Status post left hip total arthroplasty. IMPRESSION: 1. Negative examination for pulmonary embolism. 2. No acute CT findings of the chest, abdomen, or pelvis to explain right lower quadrant pain. 3. Cirrhosis. 4. Splenomegaly. 5. Small nonobstructive calculus of the midportion of the right kidney. No left-sided calculi, ureteral calculi, or hydronephrosis. 6. Sigmoid diverticulosis without evidence of acute diverticulitis. 7. Coronary artery disease. Aortic Atherosclerosis (ICD10-I70.0). Electronically Signed   By: Jearld Lesch M.D.   On: 08/19/2023 10:46   DG Chest Port 1 View Result Date: 08/19/2023 CLINICAL DATA:  Mid chest, epigastric, and lower right abdominal pain for 4 days. Pain with inspiration. EXAM: PORTABLE CHEST 1 VIEW COMPARISON:  11/12/2022 FINDINGS: Single frontal view of the chest demonstrates an unremarkable cardiac silhouette. No acute airspace disease, effusion, or pneumothorax. Stable postsurgical changes from upper thoracic posterior fusion. No acute fractures. Stable ventriculostomy catheter tubing overlying left chest. IMPRESSION: 1. No acute intrathoracic process. Electronically Signed   By: Sharlet Salina M.D.   On: 08/19/2023 08:58      Joycelyn Das, MD  [facility] 08/20/2023  If 7PM-7AM, please contact night-coverage

## 2023-08-20 NOTE — Hospital Course (Signed)
James [name] is a 68 y.o. male with past medical history significant of hypertension, hyperlipidemia, CAD s/p PTCA of the LAD in 2022, diabetes mellitus type 2, CKD stage IIIa, NASH cirrhosis, psoriatic arthritis, obesity, and OSA intolerant of CPAP presented to hospital with abdominal pain for 4 days localized to the right upper quadrant without any fever nausea vomiting.  Initially presented to med [facility] and patient was noted to be afebrile.  Labs showed elevated lipase, elevated creatinine at 1.4.  EKG unremarkable.   CT scan of the chest abdomen and pelvis did not show acute findings.  GI was consulted and patient was admitted hospital for further evaluation and treatment..   Right upper quadrant/epigastric abdominal pain likely acute pancreatitis NASH cirrhosis.  status post cholecystectomy and LFTs were normal.  Lipase was elevated at 2066.  CT scan of the abdomen pelvis did not note any acute abnormality except for cirrhosis of liver.  Follows up with Dr. Marina Goodell as outpatient.  History of EGD and colonoscopy back in 12/2022.  EGD noted trace esophageal varix with mild portal hypertensive gastropathy.  Lipase trending down today.  Triglyceride level was 145.   TSH of 1.6, HIV nonreactive.  Continue conservative treatment for pancreatitis. AFP IgG4 has been sent.    Uncontrolled diabetes mellitus type 2 with hyperglycemia   Last hemoglobin A1c was 7.9 on 07/16/2003.  Was on insulin pump which he has not been using it.  On metformin Jardiance at home.  Metformin is on hold.  Continue insulin regimen.    Essential hypertension On amlodipine Coreg Imdur at home.  Has been resumed.  Weight loss TSH within normal limits   Coronary artery disease Patient with prior history of PTCA of the LAD in 09/2020. -Continue aspirin and statin   Hyperlipidemia -Continue atorvastatin   GERD -Continue Protonix   Class I obesity BMI 31.38 kg/m.  Would benefit from weight loss  outpatient  History of depression. Continue Cymbalta.

## 2023-08-21 DIAGNOSIS — R1013 Epigastric pain: Secondary | ICD-10-CM | POA: Diagnosis not present

## 2023-08-21 LAB — BASIC METABOLIC PANEL
Anion gap: 9 (ref 5–15)
BUN: 17 mg/dL (ref 8–23)
CO2: 21 mmol/L — ABNORMAL LOW (ref 22–32)
Calcium: 8.6 mg/dL — ABNORMAL LOW (ref 8.9–10.3)
Chloride: 107 mmol/L (ref 98–111)
Creatinine, Ser: 1.14 mg/dL (ref 0.61–1.24)
GFR, Estimated: >60 mL/min (ref >60)
Glucose, Bld: 150 mg/dL — ABNORMAL HIGH (ref 70–99)
Potassium: 3.9 mmol/L (ref 3.5–5.1)
Sodium: 137 mmol/L (ref 135–145)

## 2023-08-21 LAB — CBC
HCT: 38.6 % — ABNORMAL LOW (ref 39.0–52.0)
Hemoglobin: 12.8 g/dL — ABNORMAL LOW (ref 13.0–17.0)
MCH: 28.1 pg (ref 26.0–34.0)
MCHC: 33.2 g/dL (ref 30.0–36.0)
MCV: 84.6 fL (ref 80.0–100.0)
Platelets: 175 10*3/uL (ref 150–400)
RBC: 4.56 MIL/uL (ref 4.22–5.81)
RDW: 16.7 % — ABNORMAL HIGH (ref 11.5–15.5)
WBC: 4.3 10*3/uL (ref 4.0–10.5)
nRBC: 0 % (ref 0.0–0.2)

## 2023-08-21 LAB — MAGNESIUM: Magnesium: 1.9 mg/dL (ref 1.7–2.4)

## 2023-08-21 LAB — AFP TUMOR MARKER: AFP, Serum, Tumor Marker: <1.8 ng/mL (ref 0.0–8.4)

## 2023-08-21 LAB — GLUCOSE, CAPILLARY: Glucose-Capillary: 125 mg/dL — ABNORMAL HIGH (ref 70–99)

## 2023-08-21 LAB — LIPASE, BLOOD: Lipase: 739 U/L — ABNORMAL HIGH (ref 11–51)

## 2023-08-21 NOTE — Progress Notes (Signed)
 Patient unhooked his IV & rolled the pump out into the hallway. Made NP aware.

## 2023-08-21 NOTE — Plan of Care (Signed)

## 2023-08-21 NOTE — Plan of Care (Signed)
  Problem: Health Behavior/Discharge Planning: Goal: Ability to manage health-related needs will improve Outcome: Progressing   Problem: Nutrition: Goal: Adequate nutrition will be maintained Outcome: Progressing   Problem: Education: Goal: Knowledge of General Education information will improve Description: Including pain rating scale, medication(s)/side effects and non-pharmacologic comfort measures Outcome: Not Progressing   Problem: Coping: Goal: Level of anxiety will decrease Outcome: Not Progressing

## 2023-08-21 NOTE — Progress Notes (Signed)
 Dcd piv site unremarkable. Ccmd notified of dc order. Dc instructions reviewed pt verbalized understanding.

## 2023-08-21 NOTE — Discharge Summary (Signed)
 Physician Discharge Summary  James [name] ZOX:[id number] DOB: [birth year] DOA: 08/19/2023  PCP: Wanda Plump, MD  Admit date: 08/19/2023 Discharge date: 08/21/2023  Admitted From: Home  Discharge disposition: Home  Recommendations for Outpatient Follow-Up:   Follow up with your primary care provider in one week.  Check CBC, BMP, magnesium in the next visit Advised to avoid fatty fried cheesy greasy food on discharge. IgG4 has been sent and is pending.  Please follow-up as outpatient.   Discharge Diagnosis:   Principal Problem:   Epigastric abdominal pain Active Problems:   Elevated lipase   Uncontrolled type 2 diabetes mellitus with hypoglycemia, with long-term current use of insulin (HCC)   Essential hypertension   Weight loss   Coronary artery disease   Liver cirrhosis secondary to NASH (nonalcoholic steatohepatitis) (HCC)   Hyperlipidemia   GERD (gastroesophageal reflux disease)   Obesity (BMI 30-39.9)   Acute pancreatitis   Discharge Condition: Improved.  Diet recommendation:   Carbohydrate-modified.  Avoid fatty fried foods.  Wound care: None.  Code status: Full.   History of Present Illness:    James [name] is a 68 y.o. male with past medical history significant of hypertension, hyperlipidemia, CAD s/p PTCA of the LAD in 2022, diabetes mellitus type 2, CKD stage IIIa, NASH cirrhosis, psoriatic arthritis, obesity, and OSA intolerant of CPAP presented to hospital with abdominal pain for 4 days localized to the right upper quadrant without any fever nausea vomiting.  Initially presented to med [facility] and patient was noted to be afebrile.  Labs showed elevated lipase, elevated creatinine at 1.4.  EKG unremarkable.   CT scan of the chest abdomen and pelvis did not show acute findings.  GI was consulted and patient was admitted hospital for further evaluation and treatment.Marland Kitchen   Hospital Course:   Following conditions were addressed during  hospitalization as listed below,  Right upper quadrant/epigastric abdominal pain likely acute pancreatitis NASH cirrhosis.  status post cholecystectomy in the past and LFTs were normal.  Lipase was elevated at 2066.  CT scan of the abdomen pelvis did not note any acute abnormality except for cirrhosis of liver.  Follows up with GI Dr. Marina Goodell as outpatient.  History of EGD and colonoscopy back in 12/2022.  EGD in the past noted trace esophageal varix with mild portal hypertensive gastropathy.  Lipase trending down today to 739..  Triglyceride level was 145.   TSH of 1.6, HIV nonreactive. AFP was less than 1.8.  IgG4 has been sent and is pending.. Patient has tolerated oral diet and feels much better today and wishes to go home.  Communicated with GI prior to discharge and okay for discharge home today.  Uncontrolled diabetes mellitus type 2 with hyperglycemia   Last hemoglobin A1c was 7.9 on 07/16/2003.  Was on insulin pump which he has not been using it.  On metformin Jardiance at home.    Essential hypertension On amlodipine Coreg Imdur at home.  Has been resumed.   Weight loss TSH within normal limits   Coronary artery disease Patient with prior history of PTCA of the LAD in 09/2020. Continue aspirin and statin   Hyperlipidemia -Continue atorvastatin   GERD -Continue Protonix   Class I obesity BMI 31.38 kg/m.  Would benefit from weight loss outpatient   History of depression. Continue Cymbalta.   Disposition.  At this time, patient is stable for disposition home with outpatient PCP follow-up  Medical Consultants:   GI  Procedures:  None Subjective:   Today, patient was seen and examined at bedside.  Has tolerated oral diet feels better.  Mild discomfort in the abdomen but overall improved than yesterday.  Has had bowel movements.  Discharge Exam:   Vitals:   08/21/23 0602 08/21/23 0752  BP: 120/85 (!) 135/92  Pulse: 73 68  Resp: 17 16  Temp: 98.6 F (37 C) 98.3 F  (36.8 C)  SpO2: 95% 96%   Vitals:   08/20/23 1609 08/20/23 2044 08/21/23 0602 08/21/23 0752  BP: 131/89 115/79 120/85 (!) 135/92  Pulse: 74 77 73 68  Resp: 17 18 17 16   Temp: 98.4 F (36.9 C) 98.3 F (36.8 C) 98.6 F (37 C) 98.3 F (36.8 C)  TempSrc: Oral Oral Oral Oral  SpO2: 95% 92% 95% 96%  Weight:      Height:       Body mass index is 31.38 kg/m.  General: Alert awake, not in obvious distress mildly obese HENT: pupils equally reacting to light,  No scleral pallor or icterus noted. Oral mucosa is moist.  Chest:  Clear breath sounds.   No crackles or wheezes.  CVS: S1 &S2 heard. No murmur.  Regular rate and rhythm. Abdomen: Soft, nonspecific tenderness on palpation.  Nondistended.  Bowel sounds are heard.   Extremities: No cyanosis, clubbing or edema.  Peripheral pulses are palpable. Psych: Alert, awake and oriented, normal mood CNS:  No cranial nerve deficits.  Power equal in all extremities.   Skin: Warm and dry.  No rashes noted.  The results of significant diagnostics from this hospitalization (including imaging, microbiology, ancillary and laboratory) are listed below for reference.     Diagnostic Studies:   CT Angio Chest PE W/Cm &/Or Wo Cm Result Date: 08/19/2023 CLINICAL DATA:  Chest and abdominal pain, right lower abdominal pain for 4 days EXAM: CT ANGIOGRAPHY CHEST CT ABDOMEN AND PELVIS WITH CONTRAST TECHNIQUE: Multidetector CT imaging of the chest was performed using the standard protocol during bolus administration of intravenous contrast. Multiplanar CT image reconstructions and MIPs were obtained to evaluate the vascular anatomy. Multidetector CT imaging of the abdomen and pelvis was performed using the standard protocol during bolus administration of intravenous contrast. RADIATION DOSE REDUCTION: This exam was performed according to the departmental dose-optimization program which includes automated exposure control, adjustment of the mA and/or kV according to  patient size and/or use of iterative reconstruction technique. CONTRAST:  OMNIPAQUE IOHEXOL 350 MG/ML SOLN COMPARISON:  None Available. FINDINGS: CT CHEST ANGIOGRAM FINDINGS Cardiovascular: Satisfactory opacification of the pulmonary arteries to the segmental level. No evidence of pulmonary embolism. Normal heart size. Left and right coronary artery calcifications. No pericardial effusion. Scattered aortic atherosclerosis. Mediastinum/Nodes: No enlarged mediastinal, hilar, or axillary lymph nodes. Thyroid gland, trachea, and esophagus demonstrate no significant findings. Lungs/Pleura: Lungs are clear. No pleural effusion or pneumothorax. Musculoskeletal: No chest wall abnormality. No acute osseous findings. Review of the MIP images confirms the above findings. CT ABDOMEN PELVIS FINDINGS Hepatobiliary: No solid liver abnormality is seen. Coarse, nodular cirrhotic morphology of the liver. Status post status post cholecystectomy. No biliary ductal dilatation. Pancreas: Unremarkable. No pancreatic ductal dilatation or surrounding inflammatory changes. Spleen: Splenomegaly, maximum span 16.1 cm. Adrenals/Urinary Tract: Adrenal glands are unremarkable. Small nonobstructive calculus of the midportion of the right kidney. No left-sided calculi, ureteral calculi, or hydronephrosis. 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 Bladder  is unremarkable. Stomach/Bowel: Stomach is within normal limits. Appendix appears normal. No evidence of bowel wall thickening, distention, or inflammatory changes. Sigmoid diverticulosis. Vascular/Lymphatic: Aortic atherosclerosis. No enlarged abdominal or pelvic lymph nodes. Reproductive: No mass or other significant abnormality. Other: No abdominal wall hernia or abnormality. No ascites. Shunt catheter tubing, tip positioned in the left lower  quadrant (series 306, image 69). Musculoskeletal: No acute or significant osseous findings. Status post left hip total arthroplasty. IMPRESSION: 1. Negative examination for pulmonary embolism. 2. No acute CT findings of the chest, abdomen, or pelvis to explain right lower quadrant pain. 3. Cirrhosis. 4. Splenomegaly. 5. Small nonobstructive calculus of the midportion of the right kidney. No left-sided calculi, ureteral calculi, or hydronephrosis. 6. Sigmoid diverticulosis without evidence of acute diverticulitis. 7. Coronary artery disease. Aortic Atherosclerosis (ICD10-I70.0). Electronically Signed   By: Jearld Lesch M.D.   On: 08/19/2023 10:46   CT ABDOMEN PELVIS W CONTRAST Result Date: 08/19/2023 CLINICAL DATA:  Chest and abdominal pain, right lower abdominal pain for 4 days EXAM: CT ANGIOGRAPHY CHEST CT ABDOMEN AND PELVIS WITH CONTRAST TECHNIQUE: Multidetector CT imaging of the chest was performed using the standard protocol during bolus administration of intravenous contrast. Multiplanar CT image reconstructions and MIPs were obtained to evaluate the vascular anatomy. Multidetector CT imaging of the abdomen and pelvis was performed using the standard protocol during bolus administration of intravenous contrast. RADIATION DOSE REDUCTION: This exam was performed according to the departmental dose-optimization program which includes automated exposure control, adjustment of the mA and/or kV according to patient size and/or use of iterative reconstruction technique. CONTRAST:  OMNIPAQUE IOHEXOL 350 MG/ML SOLN COMPARISON:  None Available. FINDINGS: CT CHEST ANGIOGRAM FINDINGS Cardiovascular: Satisfactory opacification of the pulmonary arteries to the segmental level. No evidence of pulmonary embolism. Normal heart size. Left and right coronary artery calcifications. No pericardial effusion. Scattered aortic atherosclerosis. Mediastinum/Nodes: No enlarged mediastinal, hilar, or axillary lymph nodes. Thyroid  gland, trachea, and esophagus demonstrate no significant findings. Lungs/Pleura: Lungs are clear. No pleural effusion or pneumothorax. Musculoskeletal: No chest wall abnormality. No acute osseous findings. Review of the MIP images confirms the above findings. CT ABDOMEN PELVIS FINDINGS Hepatobiliary: No solid liver abnormality is seen. Coarse, nodular cirrhotic morphology of the liver. Status post status post cholecystectomy. No biliary ductal dilatation. Pancreas: Unremarkable. No pancreatic ductal dilatation or surrounding inflammatory changes. Spleen: Splenomegaly, maximum span 16.1 cm. Adrenals/Urinary Tract: Adrenal glands are unremarkable. Small nonobstructive calculus of the midportion of the right kidney. No left-sided calculi, ureteral calculi, or hydronephrosis. 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 Bladder is unremarkable. Stomach/Bowel: Stomach is within normal limits. Appendix appears normal. No evidence of bowel wall thickening, distention, or inflammatory changes. Sigmoid diverticulosis. Vascular/Lymphatic: Aortic atherosclerosis. No enlarged abdominal or pelvic lymph nodes. Reproductive: No mass or other significant abnormality. Other: No abdominal wall hernia or abnormality. No ascites. Shunt catheter tubing, tip positioned in the left lower quadrant (series 306, image 69). Musculoskeletal: No acute or significant osseous findings. Status post left hip total arthroplasty. IMPRESSION: 1. Negative examination for pulmonary embolism. 2. No acute CT findings of the chest, abdomen, or pelvis to explain right lower quadrant pain. 3. Cirrhosis. 4. Splenomegaly. 5. Small nonobstructive calculus of the midportion of the right kidney. No left-sided calculi, ureteral calculi, or hydronephrosis. 6. Sigmoid diverticulosis without evidence of acute diverticulitis. 7.  Coronary artery disease. Aortic Atherosclerosis (ICD10-I70.0). Electronically Signed  By: Jearld Lesch M.D.   On: 08/19/2023 10:46   DG Chest Port 1 View Result Date: 08/19/2023 CLINICAL DATA:  Mid chest, epigastric, and lower right abdominal pain for 4 days. Pain with inspiration. EXAM: PORTABLE CHEST 1 VIEW COMPARISON:  11/12/2022 FINDINGS: Single frontal view of the chest demonstrates an unremarkable cardiac silhouette. No acute airspace disease, effusion, or pneumothorax. Stable postsurgical changes from upper thoracic posterior fusion. No acute fractures. Stable ventriculostomy catheter tubing overlying left chest. IMPRESSION: 1. No acute intrathoracic process. Electronically Signed   By: Sharlet Salina M.D.   On: 08/19/2023 08:58     Labs:   Basic Metabolic Panel: Recent Labs  Lab 08/19/23 0829 08/20/23 0529 08/21/23 0541  NA 136 137 137  K 4.2 4.1 3.9  CL 102 106 107  CO2 23 21* 21*  GLUCOSE 242* 144* 150*  BUN 19 14 17   CREATININE 1.40* 1.08 1.14  CALCIUM 8.9 8.5* 8.6*  MG  --   --  1.9   GFR Estimated Creatinine Clearance: 78.6 mL/min (by C-G formula based on SCr of 1.14 mg/dL). Liver Function Tests: Recent Labs  Lab 08/19/23 0829 08/20/23 0529  AST 20 14*  ALT 27 22  ALKPHOS 116 97  BILITOT 1.0 1.0  PROT 8.0 6.4*  ALBUMIN 3.3* 2.6*   Recent Labs  Lab 08/19/23 0842 08/20/23 0529 08/21/23 0541  LIPASE 2,066* 886* 739*   No results for input(s): "AMMONIA" in the last 168 hours. Coagulation profile Recent Labs  Lab 08/20/23 0529  INR 1.2    CBC: Recent Labs  Lab 08/19/23 0829 08/20/23 0529 08/21/23 0541  WBC 6.9 5.7 4.3  NEUTROABS 4.8  --   --   HGB 13.6 12.5* 12.8*  HCT 42.0 37.9* 38.6*  MCV 85.4 84.2 84.6  PLT 190 177 175   Cardiac Enzymes: No results for input(s): "CKTOTAL", "CKMB", "CKMBINDEX", "TROPONINI" in the last 168 hours. BNP: Invalid input(s): "POCBNP" CBG: Recent Labs  Lab 08/20/23 0758 08/20/23 1219 08/20/23 1703  08/20/23 2045 08/21/23 0755  GLUCAP 136* 165* 172* 262* 125*   D-Dimer No results for input(s): "DDIMER" in the last 72 hours. Hgb A1c No results for input(s): "HGBA1C" in the last 72 hours. Lipid Profile Recent Labs    08/20/23 0529  TRIG 145   Thyroid function studies Recent Labs    08/19/23 1617  TSH 1.693   Anemia work up No results for input(s): "VITAMINB12", "FOLATE", "FERRITIN", "TIBC", "IRON", "RETICCTPCT" in the last 72 hours. Microbiology No results found for this or any previous visit (from the past 240 hours).   Discharge Instructions:   Discharge Instructions     Call MD for:  persistant nausea and vomiting   Complete by: As directed    Call MD for:  severe uncontrolled pain   Complete by: As directed    Call MD for:  temperature >100.4   Complete by: As directed    Diet Carb Modified   Complete by: As directed    Discharge instructions   Complete by: As directed    Avoid fatty fried greasy cheesy foods.  Follow-up with your primary care provider in 1 week.  Seek medical attention for worsening symptoms.   Increase activity slowly   Complete by: As directed       Allergies as of 08/21/2023       Reactions   Bee Venom Anaphylaxis   Hydrocodone Bit-homatrop Mbr Other (See Comments)   Hallucinations, confusion, delirium Depressed feeling   Toradol [employer]  Tromethamine] Other (See Comments)   Hallucinations, confusion, delirium   Prednisone    Patient reports it causes cirrhosis to flare up   Sulfadiazine    NDC NFAO:[id number] NDC GEXB:[id number] NDC UVOZ:[id number]   Morphine And Codeine Other (See Comments)   Hallucinations, back in the 80s. States has taken vicodin before w/o problems    Sulfa Drugs Cross Reactors Rash        Medication List     TAKE these medications    acetaminophen 325 MG tablet Commonly known as: TYLENOL Take 2 tablets (650 mg total) by mouth every 4 (four) hours as needed for headache or mild pain.    amLODipine 5 MG tablet Commonly known as: NORVASC Take 1 tablet (5 mg total) by mouth daily.   aspirin 81 MG chewable tablet Chew 1 tablet (81 mg total) by mouth daily.   atorvastatin 80 MG tablet Commonly known as: LIPITOR Take 1 tablet (80 mg total) by mouth at bedtime.   Botox 200 units injection Generic drug: botulinum toxin Type A Provider to inject 155 units into the muscles of the head and neck every 3 months. Discard remainder.   carvedilol 12.5 MG tablet Commonly known as: COREG Take 1 tablet (12.5 mg total) by mouth 2 (two) times daily with a meal.   clobetasol 0.05 % external solution Commonly known as: TEMOVATE Apply 1 application externally twice a day 14 days. What changed:  how much to take when to take this reasons to take this   clonazePAM 0.5 MG tablet Commonly known as: KLONOPIN Take 1 tablet (0.5 mg total) by mouth at bedtime.   Dexcom G6 Sensor Misc Use to monitor blood sugar, change after 10 days   Dexcom G6 Transmitter Misc 1 Device by Does not apply route continuous.   dicyclomine 20 MG tablet Commonly known as: BENTYL Take 1 tablet (20 mg total) by mouth 2 (two) times daily as needed. What changed: reasons to take this   DULoxetine 60 MG capsule Commonly known as: CYMBALTA Take 2 capsules (120 mg total) by mouth daily.   EPINEPHrine 0.3 mg/0.3 mL Soaj injection Commonly known as: EpiPen 2-Pak Inject 0.3 mLs (0.3 mg total) into the muscle once as needed for up to 1 dose for anaphylaxis.   gabapentin 600 MG tablet Commonly known as: NEURONTIN Take 1 tablet (600 mg total) by mouth 2 (two) times daily.   HumaLOG 100 UNIT/ML injection Generic drug: insulin lispro Use up to 60 Units daily in pump What changed:  how much to take how to take this when to take this   Humira (2 Pen) 40 MG/0.8ML Ajkt pen Generic drug: adalimumab Inject 40mg  (0.67ml) under the skin every other week. What changed:  how much to take how to take this when  to take this   isosorbide mononitrate 120 MG 24 hr tablet Commonly known as: IMDUR Take 1 tablet (120 mg total) by mouth daily.   Jardiance 10 MG Tabs tablet Generic drug: empagliflozin Take 1 tablet (10 mg total) by mouth daily with breakfast.   metFORMIN 500 MG tablet Commonly known as: GLUCOPHAGE Take 1 tablet (500 mg total) by mouth 2 (two) times daily.   nitroGLYCERIN 0.4 MG SL tablet Commonly known as: NITROSTAT Place 1 tablet (0.4 mg total) under the tongue every 5 (five) minutes for 3 doses as needed for chest pain.   Omnipod 5 DexG7G6 Pods Gen 5 Misc Use as directed every 3 (three) days.   ondansetron 4 MG tablet Commonly known  as: ZOFRAN Take 1 tablet (4 mg total) by mouth every 8 (eight) hours as needed. What changed: reasons to take this   pantoprazole 40 MG tablet Commonly known as: PROTONIX Take 1 tablet (40 mg total) by mouth daily.   traMADol 50 MG tablet Commonly known as: ULTRAM Take 1 tablet (50 mg total) by mouth every 6 (six) hours as needed for up to 10 doses for severe pain.   UltiCare Insulin Syringe 31G X 5/16" 1 ML Misc Generic drug: Insulin Syringe-Needle U-100 Use to administer Humalog 3 times a day          Time coordinating discharge: 39 minutes  Signed:  Lafe Clerk  [facility] 08/21/2023, 10:04 AM

## 2023-08-23 ENCOUNTER — Other Ambulatory Visit: Payer: Self-pay

## 2023-08-23 ENCOUNTER — Other Ambulatory Visit: Payer: Self-pay | Admitting: Cardiology

## 2023-08-23 LAB — IGG 4: IgG, Subclass 4: 8 mg/dL (ref 2–96)

## 2023-08-24 ENCOUNTER — Encounter (HOSPITAL_COMMUNITY): Payer: Self-pay

## 2023-08-24 ENCOUNTER — Other Ambulatory Visit: Payer: Self-pay

## 2023-08-24 ENCOUNTER — Other Ambulatory Visit: Payer: Self-pay | Admitting: Neurology

## 2023-08-24 ENCOUNTER — Other Ambulatory Visit (HOSPITAL_COMMUNITY): Payer: Self-pay

## 2023-08-24 DIAGNOSIS — G43709 Chronic migraine without aura, not intractable, without status migrainosus: Secondary | ICD-10-CM

## 2023-08-24 MED ORDER — BOTOX 200 UNITS IJ SOLR
INTRAMUSCULAR | 2 refills | Status: AC
  Filled 2023-08-24: qty 1, 84d supply, fill #0

## 2023-08-24 MED ORDER — ISOSORBIDE MONONITRATE ER 120 MG PO TB24
120.0000 mg | ORAL_TABLET | Freq: Every day | ORAL | 2 refills | Status: DC
  Filled 2023-08-24: qty 90, 90d supply, fill #0
  Filled 2023-11-23: qty 90, 90d supply, fill #1

## 2023-08-24 NOTE — Progress Notes (Signed)
 Specialty Pharmacy Ongoing Clinical Assessment Note  James [name] is a 68 y.o. male who is being followed by the specialty pharmacy service for Psoriatic Arthritis  Patient's specialty medication(s) reviewed today: Adalimumab (Humira (2 Pen))   Missed doses in the last 4 weeks: 0   Patient/Caregiver did not have any additional questions or concerns.   Therapeutic benefit summary: Patient is achieving benefit   Adverse events/side effects summary: No adverse events/side effects   Patient's therapy is appropriate to: Continue    Goals Addressed             This Visit's Progress    Minimize recurrence of flares       Patient is on track. Patient will maintain adherence         Follow up:  6 months  Otto Herb Specialty Pharmacist

## 2023-08-24 NOTE — Progress Notes (Signed)
 Specialty Pharmacy Refill Coordination Note  James [name] is a 68 y.o. male contacted today regarding refills of specialty medication(s) OnabotulinumtoxinA (BOTOX)   Patient requested Courier to Provider Office   Delivery date: 09/23/23   Verified address: [facility] [address]   Medication will be filled on 09/22/23.

## 2023-08-24 NOTE — Progress Notes (Signed)
 Specialty Pharmacy Refill Coordination Note  James [name] is a 68 y.o. male contacted today regarding refills of specialty medication(s) Adalimumab (Humira (2 Pen))   Patient requested Delivery   Delivery date: 08/27/23   Verified address: [address]  [place] [address]   Medication will be filled on 08/26/23.

## 2023-08-26 ENCOUNTER — Other Ambulatory Visit: Payer: Self-pay

## 2023-08-27 ENCOUNTER — Other Ambulatory Visit: Payer: Self-pay

## 2023-08-30 ENCOUNTER — Ambulatory Visit (INDEPENDENT_AMBULATORY_CARE_PROVIDER_SITE_OTHER): Payer: PPO | Admitting: Family Medicine

## 2023-08-30 ENCOUNTER — Other Ambulatory Visit: Payer: Self-pay

## 2023-08-30 ENCOUNTER — Other Ambulatory Visit (HOSPITAL_COMMUNITY): Payer: Self-pay

## 2023-08-30 ENCOUNTER — Encounter: Payer: Self-pay | Admitting: Family Medicine

## 2023-08-30 VITALS — BP 122/88 | HR 80 | Temp 98.7°F | Resp 20 | Ht 71.0 in | Wt 227.2 lb

## 2023-08-30 DIAGNOSIS — K859 Acute pancreatitis without necrosis or infection, unspecified: Secondary | ICD-10-CM | POA: Diagnosis not present

## 2023-08-30 LAB — COMPREHENSIVE METABOLIC PANEL
ALT: 28 U/L (ref 0–53)
AST: 17 U/L (ref 0–37)
Albumin: 4 g/dL (ref 3.5–5.2)
Alkaline Phosphatase: 149 U/L — ABNORMAL HIGH (ref 39–117)
BUN: 18 mg/dL (ref 6–23)
CO2: 26 meq/L (ref 19–32)
Calcium: 9.6 mg/dL (ref 8.4–10.5)
Chloride: 101 meq/L (ref 96–112)
Creatinine, Ser: 1.35 mg/dL (ref 0.40–1.50)
GFR: 55.1 mL/min — ABNORMAL LOW (ref >60.00)
Glucose, Bld: 291 mg/dL — ABNORMAL HIGH (ref 70–99)
Potassium: 4.6 meq/L (ref 3.5–5.1)
Sodium: 138 meq/L (ref 135–145)
Total Bilirubin: 1 mg/dL (ref 0.2–1.2)
Total Protein: 7.6 g/dL (ref 6.0–8.3)

## 2023-08-30 LAB — CBC
HCT: 43.5 % (ref 39.0–52.0)
Hemoglobin: 14.2 g/dL (ref 13.0–17.0)
MCHC: 32.6 g/dL (ref 30.0–36.0)
MCV: 87.5 fl (ref 78.0–100.0)
Platelets: 192 10*3/uL (ref 150.0–400.0)
RBC: 4.97 Mil/uL (ref 4.22–5.81)
RDW: 18.4 % — ABNORMAL HIGH (ref 11.5–15.5)
WBC: 6.9 10*3/uL (ref 4.0–10.5)

## 2023-08-30 LAB — MAGNESIUM: Magnesium: 1.9 mg/dL (ref 1.5–2.5)

## 2023-08-30 NOTE — Patient Instructions (Signed)
 Give [place] 2-3 business days to get the results of your labs back.   Continue to avoid fatty/greasy foods.   Let us know if you need anything.

## 2023-08-30 NOTE — Progress Notes (Signed)
 Chief Complaint  Patient presents with   Hospitalization Follow-up    Patient presents today for a hospitalization follow-up. He was admitted into [facility] hospital from 08/19/23-08/21/23 for epigastric abdominal pain.    Subjective: Patient is a 68 y.o. male here for hosp f/u.  Patient was admitted on 08/19/2023 and discharged on 08/21/2023 for idiopathic pancreatitis.  Pain is steadily improving though still there.  Worse in the upper abdominal region.  He is eating and drinking normally.  This does not seem to affect his pain.  He has an appointment with the gastroenterology team next month.  Bowel movements are normal.  Past Medical History:  Diagnosis Date   Abnormal cardiac CT angiography    Acid reflux    Annual physical exam 04/08/2015   Arthritis    Atypical chest pain 06/13/2020   Blood transfusion without reported diagnosis    Body mass index (BMI) 35.0-35.9, adult 04/05/2019   Chronic fatigue 01/28/2015   Chronic headaches    on cymbalta   Chronic kidney disease, stage 3b (HCC) 07/14/2023   Chronic migraine w/o aura, not intractable, w/o stat migr 10/24/2018   Cirrhosis (HCC)    Colon polyps    Complication of anesthesia    problems waking up from anesthesia   Coronary artery disease 01/23/2019   Depression    on cymbalta   Diabetes mellitus with neuropathy (HCC)    Diabetes with neuropathy 04/25/2013   Diverticulitis 03/2013   Dyslipidemia 05/29/2019   Eczema    Elevated LFTs    Epidural lipomatosis 10/05/2018   Essential (primary) hypertension 04/05/2019   Essential hypertension 10/10/2019   Fatty liver    GERD (gastroesophageal reflux disease) 04/28/2011   H/O craniotomy 05/07/2015   Headache 04/28/2011   Hepatitis 10/2017   NASH cirrhosis   History of kidney stones    Hyperlipidemia    Hypersomnia with sleep apnea 01/28/2015   Hypertension    IDA (iron deficiency anemia) 01/24/2019   Idiopathic intracranial hypertension 01/14/2017   Insomnia  04/26/2013   Kidney stone    Liver cirrhosis secondary to NASH (nonalcoholic steatohepatitis) (HCC) 01/02/2016   Low back pain 04/05/2019   Lower back injury 08/14/2019   Morbid obesity (HCC)    Neuromuscular disorder (HCC)    neuropathy   Neuropathy    Nonalcoholic steatohepatitis 10/05/2018   Obstructive hydrocephalus (HCC) 01/28/2015   OSA -- dx ~ 2012, cpap intolerant 09/04/2014    dx ~ 2012, cpap intolerant    PCP NOTES >>> 04/08/2015   Post-op pain 03/19/2019   Post-traumatic hydrocephalus (HCC)    s/p shunts x 2 (first got infected )   Presence of cerebrospinal fluid drainage device 07/28/2011   Psoriasis    sees Dr Lenis Noon   Psoriatic arthritis (HCC)    REM behavioral disorder 01/14/2017   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   see's Dr.Ortmann   Severe obesity (BMI >= 40) (HCC) 01/28/2015   SI (sacroiliac) joint dysfunction 08/14/2019   Sigmoid diverticulitis 04/25/2013   Sleep apnea    no CPAP      Spondylolisthesis, lumbar region 03/16/2019   Stomach ulcer    Testosterone deficiency 04/28/2011   VP (ventriculoperitoneal) shunt status 07/31/2020    Objective: BP 122/88   Pulse 80   Temp 98.7 F (37.1 C)   Resp 20   Ht 5\' 11"  (1.803 m)   Wt 227 lb 3.2 oz (103.1 kg)   SpO2 99%   BMI 31.69 kg/m  General: Awake, appears  stated age Mouth: MMM Abdomen: Bowel sounds present, soft, nondistended, mild TTP in epigastric region, negative Murphy's, McBurney's, Rovsing's, Carnett's Heart: RRR, no LE edema Lungs: CTAB, no rales, wheezes or rhonchi. No accessory muscle use Psych: Age appropriate judgment and insight, normal affect and mood  Assessment and Plan: Acute pancreatitis, unspecified complication status, unspecified pancreatitis type - Plan: CBC, Comprehensive metabolic panel, Magnesium  Seems to steadily be improving.  He has a follow-up with GI in the next month.  If he fails to continue moving in the right direction, he will let us know  and I will reach out to their team to see if they can see him sooner.  No nausea or vomiting.  No signs of inflammation on CT scan.  Follow-up on labs as above. The patient voiced understanding and agreement to the plan.  I spent 22 minutes with the patient discussing the above plan in addition to reviewing his chart on the same day of the visit.  Jilda Roche [place], DO 08/30/23  11:47 AM

## 2023-08-31 ENCOUNTER — Telehealth: Payer: Self-pay

## 2023-08-31 ENCOUNTER — Telehealth: Payer: Self-pay | Admitting: Neurology

## 2023-08-31 ENCOUNTER — Encounter: Payer: Self-pay | Admitting: Family Medicine

## 2023-08-31 NOTE — Telephone Encounter (Signed)
 Copied from CRM [id number]. Topic: Clinical - Medical Advice >> Aug 31, 2023  4:21 PM [place] J wrote: Reason for CRM: Nurse Darien Ramus who works with [employer] calling in to report that patient was hospitalized a few weeks ago. Patient has not been using insulin pump and dexcom since his release from the hospital. Darien Ramus did encourage patient to go back to original routine to check his glucose but patient stated that he simply did not feel like it.    Ana was relaying this over to see if Dr. Drue Novel would like to follow up with patient about using his insulin and dexcom. that? Patient has not monitored since discharge from hospital.

## 2023-08-31 NOTE — Telephone Encounter (Addendum)
 Pt has new [employer]. Submitted auth request to pharmacy benefit via CMM, status is pending. Key: Wanita Chamberlain

## 2023-08-31 NOTE — Telephone Encounter (Signed)
 Will forward to endo.

## 2023-09-01 ENCOUNTER — Other Ambulatory Visit: Payer: Self-pay

## 2023-09-01 ENCOUNTER — Other Ambulatory Visit (HOSPITAL_COMMUNITY): Payer: Self-pay

## 2023-09-01 MED ORDER — HUMIRA (2 PEN) 40 MG/0.8ML ~~LOC~~ AJKT
40.0000 mg | AUTO-INJECTOR | SUBCUTANEOUS | 5 refills | Status: DC
  Filled 2023-09-01 (×2): qty 2, 28d supply, fill #0

## 2023-09-01 NOTE — Telephone Encounter (Signed)
 Case ID #: [id number] (08/31/23-06/28/24); will continue to fill through [facility].

## 2023-09-01 NOTE — Telephone Encounter (Signed)
 Please encourage patient to use insulin pump and Dexcom blood sugar monitoring.  He was using OmniPod insulin pump.  I had sent prescription for Dexcom in the most recent visit in January.  If he needs sooner follow-up than in April we can set up that, please check with the patient.  Iraq Sheena Donegan, MD [facility] [address] [place], [address] [place], [place] [address] Phone # [phone]

## 2023-09-01 NOTE — Telephone Encounter (Signed)
 Patient called and advised as directed by MD. Patient states he has everything he needs just needed to apply it. Patient contracted with RN to place the Dexcom on. Patient states he does not feel like he needs to move his appointment up/see MD sooner.

## 2023-09-02 ENCOUNTER — Other Ambulatory Visit (HOSPITAL_BASED_OUTPATIENT_CLINIC_OR_DEPARTMENT_OTHER): Payer: Self-pay

## 2023-09-02 ENCOUNTER — Other Ambulatory Visit: Payer: Self-pay

## 2023-09-06 ENCOUNTER — Ambulatory Visit: Payer: PPO | Admitting: Urology

## 2023-09-06 ENCOUNTER — Other Ambulatory Visit: Payer: Self-pay

## 2023-09-06 ENCOUNTER — Other Ambulatory Visit (HOSPITAL_COMMUNITY): Payer: Self-pay

## 2023-09-06 ENCOUNTER — Encounter: Payer: Self-pay | Admitting: Urology

## 2023-09-06 VITALS — BP 127/84 | HR 84 | Ht 71.0 in | Wt 223.0 lb

## 2023-09-06 DIAGNOSIS — N529 Male erectile dysfunction, unspecified: Secondary | ICD-10-CM | POA: Diagnosis not present

## 2023-09-06 NOTE — Progress Notes (Signed)
 Assessment: 1. Organic impotence     Plan: Begin Trimix 10/30/1 0.3 ml/injection. Instructions on injection method provided to the patient. Patient advised to call if current dose not effective. Return to office in 2 months  Chief Complaint:  Chief Complaint  Patient presents with   Erectile Dysfunction    History of Present Illness:  James [name] is a 68 y.o. male who is seen for further evaluation of erectile dysfunction. He has a long history of erectile dysfunction.  He is currently unable to achieve a spontaneous erection.  He does not have any nocturnal or early morning erections.  No decrease in his libido.  He has previously tried penile injections in 2022.  He reports that these did not work well for him.  It is unclear if he ever titrated up from the starting dose.  He has not used any treatment recently. Risk factors for erectile dysfunction include hypertension, hypercholesterolemia, diabetes, coronary artery disease, beta-blocker use. He is currently on isosorbide mono nitrate for his coronary artery disease.  He reported some mild lower urinary tract symptoms with frequency and nocturia.  No dysuria or gross hematuria. IPSS = 7. PSA 2/25: 0.65.  He has a history of low testosterone in 2016.  Testosterone level at that time was 217.  Repeat testosterone was 342.  He presents today for penile injection therapy    Past Medical History:  Past Medical History:  Diagnosis Date   Abnormal cardiac CT angiography    Acid reflux    Annual physical exam 04/08/2015   Arthritis    Atypical chest pain 06/13/2020   Blood transfusion without reported diagnosis    Body mass index (BMI) 35.0-35.9, adult 04/05/2019   Chronic fatigue 01/28/2015   Chronic headaches    on cymbalta   Chronic kidney disease, stage 3b (HCC) 07/14/2023   Chronic migraine w/o aura, not intractable, w/o stat migr 10/24/2018   Cirrhosis (HCC)    Colon polyps    Complication of anesthesia     problems waking up from anesthesia   Coronary artery disease 01/23/2019   Depression    on cymbalta   Diabetes mellitus with neuropathy (HCC)    Diabetes with neuropathy 04/25/2013   Diverticulitis 03/2013   Dyslipidemia 05/29/2019   Eczema    Elevated LFTs    Epidural lipomatosis 10/05/2018   Essential (primary) hypertension 04/05/2019   Essential hypertension 10/10/2019   Fatty liver    GERD (gastroesophageal reflux disease) 04/28/2011   H/O craniotomy 05/07/2015   Headache 04/28/2011   Hepatitis 10/2017   NASH cirrhosis   History of kidney stones    Hyperlipidemia    Hypersomnia with sleep apnea 01/28/2015   Hypertension    IDA (iron deficiency anemia) 01/24/2019   Idiopathic intracranial hypertension 01/14/2017   Insomnia 04/26/2013   Kidney stone    Liver cirrhosis secondary to NASH (nonalcoholic steatohepatitis) (HCC) 01/02/2016   Low back pain 04/05/2019   Lower back injury 08/14/2019   Morbid obesity (HCC)    Neuromuscular disorder (HCC)    neuropathy   Neuropathy    Nonalcoholic steatohepatitis 10/05/2018   Obstructive hydrocephalus (HCC) 01/28/2015   OSA -- dx ~ 2012, cpap intolerant 09/04/2014    dx ~ 2012, cpap intolerant    PCP NOTES >>> 04/08/2015   Post-op pain 03/19/2019   Post-traumatic hydrocephalus (HCC)    s/p shunts x 2 (first got infected )   Presence of cerebrospinal fluid drainage device 07/28/2011   Psoriasis  sees Dr Lenis Noon   Psoriatic arthritis (HCC)    REM behavioral disorder 01/14/2017   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   see's Dr.Ortmann   Severe obesity (BMI >= 40) (HCC) 01/28/2015   SI (sacroiliac) joint dysfunction 08/14/2019   Sigmoid diverticulitis 04/25/2013   Sleep apnea    no CPAP      Spondylolisthesis, lumbar region 03/16/2019   Stomach ulcer    Testosterone deficiency 04/28/2011   VP (ventriculoperitoneal) shunt status 07/31/2020    Past Surgical History:  Past Surgical History:  Procedure  Laterality Date   AMPUTATION Left 12/27/2021   Procedure: AMPUTATION GREAT TOE;  Surgeon: Vivi Barrack, DPM;  Location: [facility] OR;  Service: Podiatry;  Laterality: Left;   BACK SURGERY  1980   BRAIN SURGERY     VP shunts placed in 2007   CHOLECYSTECTOMY N/A 08/25/2017   Procedure: LAPAROSCOPIC CHOLECYSTECTOMY WITH INTRAOPERATIVE CHOLANGIOGRAM;  Surgeon: Griselda Miner, MD;  Location: [facility] OR;  Service: General;  Laterality: N/A;   COLONOSCOPY     CORONARY STENT INTERVENTION N/A 09/27/2020   Procedure: CORONARY STENT INTERVENTION;  Surgeon: Corky Crafts, MD;  Location: [facility] INVASIVE CV LAB;  Service: Cardiovascular;  Laterality: N/A;   CORONARY ULTRASOUND/IVUS N/A 09/27/2020   Procedure: Intravascular Ultrasound/IVUS;  Surgeon: Corky Crafts, MD;  Location: [facility] INVASIVE CV LAB;  Service: Cardiovascular;  Laterality: N/A;   JOINT REPLACEMENT     total hip   LEFT HEART CATH N/A 09/27/2020   Procedure: Left Heart Cath;  Surgeon: Corky Crafts, MD;  Location: [facility] INVASIVE CV LAB;  Service: Cardiovascular;  Laterality: N/A;   LEFT HEART CATH AND CORONARY ANGIOGRAPHY N/A 09/24/2020   Procedure: LEFT HEART CATH AND CORONARY ANGIOGRAPHY;  Surgeon: Corky Crafts, MD;  Location: [facility] INVASIVE CV LAB;  Service: Cardiovascular;  Laterality: N/A;   LEFT HEART CATH AND CORONARY ANGIOGRAPHY N/A 08/18/2021   Procedure: LEFT HEART CATH AND CORONARY ANGIOGRAPHY;  Surgeon: Lennette Bihari, MD;  Location: [facility] INVASIVE CV LAB;  Service: Cardiovascular;  Laterality: N/A;   LUMBAR FUSION  03/16/2019   SHOULDER SURGERY Left 2010   TEE WITHOUT CARDIOVERSION N/A 01/01/2022   Procedure: TRANSESOPHAGEAL ECHOCARDIOGRAM (TEE);  Surgeon: Thomasene Ripple, DO;  Location: [facility] ENDOSCOPY;  Service: Cardiovascular;  Laterality: N/A;   TOE SURGERY Left 2018   TONSILLECTOMY     as a child   TOTAL HIP ARTHROPLASTY Left 2011   UPPER GASTROINTESTINAL ENDOSCOPY  01/04/2020   VENTRICULOPERITONEAL SHUNT  2007   x2     Allergies:  Allergies  Allergen Reactions   Bee Venom Anaphylaxis   Hydrocodone Bit-Homatrop Mbr Other (See Comments)    Hallucinations, confusion, delirium Depressed feeling   Toradol [Ketorolac Tromethamine] Other (See Comments)    Hallucinations, confusion, delirium   Prednisone     Patient reports it causes cirrhosis to flare up   Sulfadiazine     NDC [id number] NDC [id number] NDC XBMW:[id number]   Morphine And Codeine Other (See Comments)    Hallucinations, back in the 80s. States has taken vicodin before w/o problems    Sulfa Drugs Cross Reactors Rash    Family History:  Family History  Problem Relation Age of Onset   Other Mother    Lung cancer Father        alive, former smoker    Heart disease Brother        MI age 66   Other Brother        Murdered  Down syndrome Son    Diabetes Neg Hx    Prostate cancer Neg Hx    Colon cancer Neg Hx    Stomach cancer Neg Hx    Pancreatic cancer Neg Hx    Liver disease Neg Hx     Social History:  Social History   Tobacco Use   Smoking status: Never   Smokeless tobacco: Never  Vaping Use   Vaping status: Never Used  Substance Use Topics   Alcohol use: No   Drug use: No    ROS: Constitutional:  Negative for fever, chills, weight loss CV: Negative for chest pain, previous MI, hypertension Respiratory:  Negative for shortness of breath, wheezing, sleep apnea, frequent cough GI:  Negative for nausea, vomiting, bloody stool, GERD  Physical exam: BP 127/84   Pulse 84   Ht 5\' 11"  (1.803 m)   Wt 223 lb (101.2 kg)   BMI 31.10 kg/m  GENERAL APPEARANCE:  Well appearing, well developed, well nourished, NAD HEENT:  Atraumatic, normocephalic, oropharynx clear NECK:  Supple without lymphadenopathy or thyromegaly ABDOMEN:  Soft, non-tender, no masses EXTREMITIES:  Moves all extremities well, without clubbing, cyanosis, or edema NEUROLOGIC:  Alert and oriented x 3, normal gait, CN [name]-XII grossly  intact MENTAL STATUS:  appropriate BACK:  Non-tender to palpation, No CVAT SKIN:  Warm, dry, and intact   Results: None  Procedure:  Penile Injection  Under sterile conditions 0.2 milliliters of triple mix 10/30/1  is injected into the penile corporeal body.  The patient is then examined after 10 minutes.  The injection  induces a unsatisfactory erection for vaginal penetration.  The patient is instructed on proper injection technique under sterile conditions.  Most importantly he realizes if he ever has a painful prolonged erection also known as a priapism that he should seek care within three hours.  Failure to seek care could lead to permanent penile damage, penile fibrosis, penile gangrene, or possible loss of penis.  He reports good good understanding.  All questions are answered to his satisfaction.

## 2023-09-06 NOTE — Telephone Encounter (Signed)
 Copay with pharmacy benefit is $1,600. Pt is not eligible for Botox Savings due to having Medicare. He is requesting to be B/B under medical benefit instead.

## 2023-09-06 NOTE — Progress Notes (Signed)
 Patient not filling Botox at this time, due to copay. He has the option to enroll in [facility] payment plan, but does not want to do that. Office will look into medical benefits for him. Dis-enrolling

## 2023-09-07 ENCOUNTER — Encounter: Payer: Self-pay | Admitting: Neurology

## 2023-09-07 ENCOUNTER — Other Ambulatory Visit (HOSPITAL_COMMUNITY): Payer: Self-pay

## 2023-09-07 NOTE — Telephone Encounter (Addendum)
 Called [facility] and spoke with Hailey to see if codes required PA. Neither [id number] or [id number] require PA unless provider is out of network.   I was transferred to [facility] in the benefits to try and get a rough OOP estimate. He told me [phone] would have a 20% coinsurance, and [phone] would have an additional 20% coinsurance. I sent MyChart message to the pt asking if he'd like to move forward or discuss other options.

## 2023-09-08 ENCOUNTER — Emergency Department (HOSPITAL_BASED_OUTPATIENT_CLINIC_OR_DEPARTMENT_OTHER)

## 2023-09-08 ENCOUNTER — Encounter (HOSPITAL_BASED_OUTPATIENT_CLINIC_OR_DEPARTMENT_OTHER): Payer: Self-pay | Admitting: Emergency Medicine

## 2023-09-08 ENCOUNTER — Other Ambulatory Visit: Payer: Self-pay

## 2023-09-08 ENCOUNTER — Emergency Department (HOSPITAL_BASED_OUTPATIENT_CLINIC_OR_DEPARTMENT_OTHER)
Admission: EM | Admit: 2023-09-08 | Discharge: 2023-09-09 | Disposition: A | Discharge status: Home / Self Care | Attending: Emergency Medicine | Admitting: Emergency Medicine

## 2023-09-08 DIAGNOSIS — Z794 Long term (current) use of insulin: Secondary | ICD-10-CM | POA: Insufficient documentation

## 2023-09-08 DIAGNOSIS — Z79899 Other long term (current) drug therapy: Secondary | ICD-10-CM | POA: Diagnosis not present

## 2023-09-08 DIAGNOSIS — E1140 Type 2 diabetes mellitus with diabetic neuropathy, unspecified: Secondary | ICD-10-CM | POA: Diagnosis not present

## 2023-09-08 DIAGNOSIS — Z7984 Long term (current) use of oral hypoglycemic drugs: Secondary | ICD-10-CM | POA: Insufficient documentation

## 2023-09-08 DIAGNOSIS — Z7982 Long term (current) use of aspirin: Secondary | ICD-10-CM | POA: Diagnosis not present

## 2023-09-08 DIAGNOSIS — R0789 Other chest pain: Secondary | ICD-10-CM | POA: Diagnosis not present

## 2023-09-08 DIAGNOSIS — E1122 Type 2 diabetes mellitus with diabetic chronic kidney disease: Secondary | ICD-10-CM | POA: Insufficient documentation

## 2023-09-08 DIAGNOSIS — N1832 Chronic kidney disease, stage 3b: Secondary | ICD-10-CM | POA: Insufficient documentation

## 2023-09-08 DIAGNOSIS — I129 Hypertensive chronic kidney disease with stage 1 through stage 4 chronic kidney disease, or unspecified chronic kidney disease: Secondary | ICD-10-CM | POA: Diagnosis not present

## 2023-09-08 DIAGNOSIS — R42 Dizziness and giddiness: Secondary | ICD-10-CM | POA: Diagnosis not present

## 2023-09-08 DIAGNOSIS — Z982 Presence of cerebrospinal fluid drainage device: Secondary | ICD-10-CM | POA: Diagnosis not present

## 2023-09-08 DIAGNOSIS — G9389 Other specified disorders of brain: Secondary | ICD-10-CM | POA: Diagnosis not present

## 2023-09-08 DIAGNOSIS — R4182 Altered mental status, unspecified: Secondary | ICD-10-CM | POA: Insufficient documentation

## 2023-09-08 DIAGNOSIS — R079 Chest pain, unspecified: Secondary | ICD-10-CM | POA: Insufficient documentation

## 2023-09-08 DIAGNOSIS — I2510 Atherosclerotic heart disease of native coronary artery without angina pectoris: Secondary | ICD-10-CM | POA: Diagnosis not present

## 2023-09-08 DIAGNOSIS — Z630 Problems in relationship with spouse or partner: Secondary | ICD-10-CM | POA: Diagnosis not present

## 2023-09-08 LAB — URINALYSIS, ROUTINE W REFLEX MICROSCOPIC
Bilirubin Urine: NEGATIVE
Glucose, UA: >=500 mg/dL — AB
Hgb urine dipstick: NEGATIVE
Ketones, ur: NEGATIVE mg/dL
Leukocytes,Ua: NEGATIVE
Nitrite: NEGATIVE
Protein, ur: NEGATIVE mg/dL
Specific Gravity, Urine: 1.02 (ref 1.005–1.030)
pH: 5.5 (ref 5.0–8.0)

## 2023-09-08 LAB — CBC WITH DIFFERENTIAL/PLATELET
Abs Immature Granulocytes: 0.02 10*3/uL (ref 0.00–0.07)
Basophils Absolute: 0 10*3/uL (ref 0.0–0.1)
Basophils Relative: 1 %
Eosinophils Absolute: 0.2 10*3/uL (ref 0.0–0.5)
Eosinophils Relative: 3 %
HCT: 45.5 % (ref 39.0–52.0)
Hemoglobin: 15.3 g/dL (ref 13.0–17.0)
Immature Granulocytes: 0 %
Lymphocytes Relative: 22 %
Lymphs Abs: 1.4 10*3/uL (ref 0.7–4.0)
MCH: 27.8 pg (ref 26.0–34.0)
MCHC: 33.6 g/dL (ref 30.0–36.0)
MCV: 82.7 fL (ref 80.0–100.0)
Monocytes Absolute: 0.4 10*3/uL (ref 0.1–1.0)
Monocytes Relative: 6 %
Neutro Abs: 4.4 10*3/uL (ref 1.7–7.7)
Neutrophils Relative %: 68 %
Platelets: 167 10*3/uL (ref 150–400)
RBC: 5.5 MIL/uL (ref 4.22–5.81)
RDW: 16.7 % — ABNORMAL HIGH (ref 11.5–15.5)
WBC: 6.5 10*3/uL (ref 4.0–10.5)
nRBC: 0 % (ref 0.0–0.2)

## 2023-09-08 LAB — AMMONIA: Ammonia: 22 umol/L (ref 9–35)

## 2023-09-08 LAB — BASIC METABOLIC PANEL
Anion gap: 10 (ref 5–15)
BUN: 18 mg/dL (ref 8–23)
CO2: 21 mmol/L — ABNORMAL LOW (ref 22–32)
Calcium: 8.9 mg/dL (ref 8.9–10.3)
Chloride: 105 mmol/L (ref 98–111)
Creatinine, Ser: 1.02 mg/dL (ref 0.61–1.24)
GFR, Estimated: >60 mL/min (ref >60)
Glucose, Bld: 219 mg/dL — ABNORMAL HIGH (ref 70–99)
Potassium: 4.1 mmol/L (ref 3.5–5.1)
Sodium: 136 mmol/L (ref 135–145)

## 2023-09-08 LAB — RAPID URINE DRUG SCREEN, HOSP PERFORMED
Amphetamines: NOT DETECTED
Barbiturates: NOT DETECTED
Benzodiazepines: NOT DETECTED
Cocaine: NOT DETECTED
Opiates: NOT DETECTED
Tetrahydrocannabinol: NOT DETECTED

## 2023-09-08 LAB — HEPATIC FUNCTION PANEL
ALT: 30 U/L (ref 0–44)
AST: 21 U/L (ref 15–41)
Albumin: 4 g/dL (ref 3.5–5.0)
Alkaline Phosphatase: 126 U/L (ref 38–126)
Bilirubin, Direct: 0.3 mg/dL — ABNORMAL HIGH (ref 0.0–0.2)
Indirect Bilirubin: 1.5 mg/dL — ABNORMAL HIGH (ref 0.3–0.9)
Total Bilirubin: 1.8 mg/dL — ABNORMAL HIGH (ref 0.0–1.2)
Total Protein: 8.4 g/dL — ABNORMAL HIGH (ref 6.5–8.1)

## 2023-09-08 LAB — TROPONIN I (HIGH SENSITIVITY)
Troponin I (High Sensitivity): 4 ng/L (ref <18)
Troponin I (High Sensitivity): 3 ng/L (ref <18)

## 2023-09-08 LAB — URINALYSIS, MICROSCOPIC (REFLEX): RBC / HPF: NONE SEEN RBC/hpf (ref 0–5)

## 2023-09-08 NOTE — ED Notes (Signed)
 Patient transported to X-ray

## 2023-09-08 NOTE — Progress Notes (Signed)
 Copay over $1000, per Tiffany patient did not wish to enroll in [facility] Payment Plan. She will be speaking with patient and provider office about next steps, profiling Rx for now.

## 2023-09-08 NOTE — ED Provider Notes (Signed)
 [place] [facility] Provider Note   CSN: [id number] Arrival date & time: 09/08/23  1234     History {Add pertinent medical, surgical, social history, OB history to HPI:1} Chief Complaint  Patient presents with   Chest Pain   Altered Mental Status    James [name] is a 68 y.o. male with HTN, posttraumatic obstructive hydrocephalus status post VP shunt, HLD, cirrhosis secondary to NASH, CAD s/p PTCA of LAD 2022, CKD, h/o osteomyelitis, T2DM, psoriatic arthritis, GERD, obesity, OSA intolerant of CPAP who presents with CP last night and into this morning.  Has had this pain on and off for the last several months.  It has been worsening slightly and occurs while at rest.  It is not exertional in nature.  It is gone away now, no pain.  It is nonradiating.  He did not take any nitroglycerin but he does have some at home. Not a/w SOB, nausea/vomiting, diaphoresis.   Patient reports with his wife who also has another concern.  Wife reports that she has noticed a change in patient's behavior over the last several months.  In December she learned that he had been having a relationship with a woman in [place] over the Internet and gave her a total of $40,000 in [employer] cards.  Over the last several months he has seem to have a change in his behavior and is not caring as much which has been very concerning to her and inconsistent with a he normally would be.  Last night she found out that there had been other recent relationships with several other women over the Internet and potentially as much is $15,000 of debt incurred.  When she found out, she told him that their marriage was over and he needed to sleep out in the other building.  Then this morning he began to complain of chest pain.  Wife notes that he did not take his medications last night either.  Wife is very concerned that there may be something medically wrong to cause his change in behavior.  Interviewed  separately from the wife, patient states that he does not know what has caused his change in behavior stating, "it is just one of those things."  He states that he has not had any SI, HI, AH, VH.  No thoughts that are alarming to him.  He has never sought out counseling and is not interested in counseling. He states he feels safe at home and has a friend he can talk to about his situation. He states he is sleeping okay.  He states that he has chronic headaches that are normal for him.  He had a headache this morning but it is not unusual.  He had has had his VP shunt for many years and had an infection complication in 2007 that required a replacement shunt, but his symptoms at that time were very severe including nausea vomiting and very severe headaches, nothing like any symptoms he has had recently.  Per chart review was admitted from 08/19/2023 to 08/21/2023 for acute pancreatitis with plans for follow-up with GI. Today he has no abdominal pain, nausea/vomiting.  Past Medical History:  Diagnosis Date   Abnormal cardiac CT angiography    Acid reflux    Annual physical exam 04/08/2015   Arthritis    Atypical chest pain 06/13/2020   Blood transfusion without reported diagnosis    Body mass index (BMI) 35.0-35.9, adult 04/05/2019   Chronic fatigue 01/28/2015  Chronic headaches    on cymbalta   Chronic kidney disease, stage 3b (HCC) 07/14/2023   Chronic migraine w/o aura, not intractable, w/o stat migr 10/24/2018   Cirrhosis (HCC)    Colon polyps    Complication of anesthesia    problems waking up from anesthesia   Coronary artery disease 01/23/2019   Depression    on cymbalta   Diabetes mellitus with neuropathy (HCC)    Diabetes with neuropathy 04/25/2013   Diverticulitis 03/2013   Dyslipidemia 05/29/2019   Eczema    Elevated LFTs    Epidural lipomatosis 10/05/2018   Essential (primary) hypertension 04/05/2019   Essential hypertension 10/10/2019   Fatty liver    GERD  (gastroesophageal reflux disease) 04/28/2011   H/O craniotomy 05/07/2015   Headache 04/28/2011   Hepatitis 10/2017   NASH cirrhosis   History of kidney stones    Hyperlipidemia    Hypersomnia with sleep apnea 01/28/2015   Hypertension    IDA (iron deficiency anemia) 01/24/2019   Idiopathic intracranial hypertension 01/14/2017   Insomnia 04/26/2013   Kidney stone    Liver cirrhosis secondary to NASH (nonalcoholic steatohepatitis) (HCC) 01/02/2016   Low back pain 04/05/2019   Lower back injury 08/14/2019   Morbid obesity (HCC)    Neuromuscular disorder (HCC)    neuropathy   Neuropathy    Nonalcoholic steatohepatitis 10/05/2018   Obstructive hydrocephalus (HCC) 01/28/2015   OSA -- dx ~ 2012, cpap intolerant 09/04/2014    dx ~ 2012, cpap intolerant    PCP NOTES >>> 04/08/2015   Post-op pain 03/19/2019   Post-traumatic hydrocephalus (HCC)    s/p shunts x 2 (first got infected )   Presence of cerebrospinal fluid drainage device 07/28/2011   Psoriasis    sees Dr Lenis Noon   Psoriatic arthritis (HCC)    REM behavioral disorder 01/14/2017   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   see's Dr.Ortmann   Severe obesity (BMI >= 40) (HCC) 01/28/2015   SI (sacroiliac) joint dysfunction 08/14/2019   Sigmoid diverticulitis 04/25/2013   Sleep apnea    no CPAP      Spondylolisthesis, lumbar region 03/16/2019   Stomach ulcer    Testosterone deficiency 04/28/2011   VP (ventriculoperitoneal) shunt status 07/31/2020       Home Medications Prior to Admission medications   Medication Sig Start Date End Date Taking? Authorizing Provider  acetaminophen (TYLENOL) 325 MG tablet Take 2 tablets (650 mg total) by mouth every 4 (four) hours as needed for headache or mild pain. 08/18/21   Leone Brand, NP  adalimumab (HUMIRA, 2 PEN,) 40 MG/0.8ML AJKT pen Inject 40mg  (0.38ml) under the skin every other week. Patient taking differently: Inject 40 mg into the skin every 14 (fourteen) days.  04/01/23   Jegede, Phylliss Blakes, MD  adalimumab (HUMIRA, 2 PEN,) 40 MG/0.8ML AJKT pen Inject 0.8 mLs (40 mg total) into the skin every 14 (fourteen) days. 08/30/23     amLODipine (NORVASC) 5 MG tablet Take 1 tablet (5 mg total) by mouth daily. 12/29/22   Georgeanna Lea, MD  aspirin 81 MG chewable tablet Chew 1 tablet (81 mg total) by mouth daily. 08/19/21   Leone Brand, NP  atorvastatin (LIPITOR) 80 MG tablet Take 1 tablet (80 mg total) by mouth at bedtime. 03/24/23   Wanda Plump, MD  botulinum toxin Type A (BOTOX) 200 units injection Provider to inject 155 units into the muscles of the head and neck every 3 months. Discard remainder. 08/24/23  Sater, Pearletha Furl, MD  carvedilol (COREG) 12.5 MG tablet Take 1 tablet (12.5 mg total) by mouth 2 (two) times daily with a meal. 04/23/23   Paz, Nolon Rod, MD  clobetasol (TEMOVATE) 0.05 % external solution Apply 1 application externally twice a day 14 days. Patient taking differently: Apply 1 Application topically 2 (two) times daily as needed (rash). 10/01/22     clonazePAM (KLONOPIN) 0.5 MG tablet Take 1 tablet (0.5 mg total) by mouth at bedtime. 03/29/23 10/02/23  Sater, Pearletha Furl, MD  Continuous Glucose Sensor (DEXCOM G6 SENSOR) MISC Use to monitor blood sugar, change after 10 days 07/16/23   Thapa, [place], MD  Continuous Glucose Transmitter (DEXCOM G6 TRANSMITTER) MISC 1 Device by Does not apply route continuous. 07/16/23   Thapa, [place], MD  dicyclomine (BENTYL) 20 MG tablet Take 1 tablet (20 mg total) by mouth 2 (two) times daily as needed. Patient taking differently: Take 20 mg by mouth 2 (two) times daily as needed for spasms. 02/12/23   Gowens, Mariah L, PA-C  DULoxetine (CYMBALTA) 60 MG capsule Take 2 capsules (120 mg total) by mouth daily. 03/24/23   Wanda Plump, MD  empagliflozin (JARDIANCE) 10 MG TABS tablet Take 1 tablet (10 mg total) by mouth daily with breakfast. 08/02/23   Thapa, [place], MD  EPINEPHrine (EPIPEN 2-PAK) 0.3 mg/0.3 mL IJ SOAJ injection Inject  0.3 mLs (0.3 mg total) into the muscle once as needed for up to 1 dose for anaphylaxis. 01/10/20   Wanda Plump, MD  gabapentin (NEURONTIN) 600 MG tablet Take 1 tablet (600 mg total) by mouth 2 (two) times daily. 04/09/23   Wanda Plump, MD  Insulin Disposable Pump (OMNIPOD 5 G6 PODS, GEN 5,) MISC Use as directed every 3 (three) days. 09/09/22   Reather Littler, MD  insulin lispro (HUMALOG) 100 UNIT/ML injection Use up to 60 Units daily in pump Patient taking differently: Inject 60 Units into the skin See admin instructions. Use up to 60 Units daily in pump 10/07/22   Reather Littler, MD  Insulin Syringe-Needle U-100 (INSULIN SYRINGE 1CC/31GX5/16") 31G X 5/16" 1 ML MISC Use to administer Humalog 3 times a day Patient taking differently: 1 tablet by Other route See admin instructions. Use to administer Humalog 3 times a day 10/07/22   Reather Littler, MD  isosorbide mononitrate (IMDUR) 120 MG 24 hr tablet Take 1 tablet (120 mg total) by mouth daily. 08/24/23   Georgeanna Lea, MD  metFORMIN (GLUCOPHAGE) 500 MG tablet Take 1 tablet (500 mg total) by mouth 2 (two) times daily. 04/16/23   Thapa, [place], MD  nitroGLYCERIN (NITROSTAT) 0.4 MG SL tablet Place 1 tablet (0.4 mg total) under the tongue every 5 (five) minutes for 3 doses as needed for chest pain. 06/26/21   Wanda Plump, MD  pantoprazole (PROTONIX) 40 MG tablet Take 1 tablet (40 mg total) by mouth daily. 03/16/23   Wanda Plump, MD  traMADol (ULTRAM) 50 MG tablet Take 1 tablet (50 mg total) by mouth every 6 (six) hours as needed for up to 10 doses for severe pain. 10/18/22   Mardene Sayer, MD      Allergies    Bee venom, Hydrocodone bit-homatrop mbr, Toradol [ketorolac tromethamine], Prednisone, Sulfadiazine, Morphine and codeine, and Sulfa drugs cross reactors    Review of Systems   Review of Systems A 10 point review of systems was performed and is negative unless otherwise reported in HPI.  Physical Exam Updated Vital Signs BP (!) 145/99  Pulse 81    Temp 97.9 F (36.6 C)   Resp (!) 23   Ht 5\' 11"  (1.803 m)   Wt 101.2 kg   SpO2 93%   BMI 31.12 kg/m  Physical Exam General: Normal appearing {Desc; male/male:11659}, lying in bed.  HEENT: PERRLA, Sclera anicteric, MMM, trachea midline.  Cardiology: RRR, no murmurs/rubs/gallops. BL radial and DP pulses equal bilaterally.  Resp: Normal respiratory rate and effort. CTAB, no wheezes, rhonchi, crackles.  Abd: Soft, non-tender, non-distended. No rebound tenderness or guarding.  GU: Deferred. MSK: No peripheral edema or signs of trauma. Extremities without deformity or TTP. No cyanosis or clubbing. Skin: warm, dry. No rashes or lesions. Back: No CVA tenderness Neuro: A&Ox4, CNs [name]-XII grossly intact. MAEs. Sensation grossly intact.  Psych: Normal mood and affect.   ED Results / Procedures / Treatments   Labs (all labs ordered are listed, but only abnormal results are displayed) Labs Reviewed  BASIC METABOLIC PANEL - Abnormal; Notable for the following components:      Result Value   CO2 21 (*)    Glucose, Bld 219 (*)    All other components within normal limits  CBC WITH DIFFERENTIAL/PLATELET - Abnormal; Notable for the following components:   RDW 16.7 (*)    All other components within normal limits  URINALYSIS, ROUTINE W REFLEX MICROSCOPIC - Abnormal; Notable for the following components:   Glucose, UA >=500 (*)    All other components within normal limits  HEPATIC FUNCTION PANEL - Abnormal; Notable for the following components:   Total Protein 8.4 (*)    Total Bilirubin 1.8 (*)    Bilirubin, Direct 0.3 (*)    Indirect Bilirubin 1.5 (*)    All other components within normal limits  URINALYSIS, MICROSCOPIC (REFLEX) - Abnormal; Notable for the following components:   Bacteria, UA RARE (*)    All other components within normal limits  RAPID URINE DRUG SCREEN, HOSP PERFORMED  AMMONIA  TROPONIN I (HIGH SENSITIVITY)  TROPONIN I (HIGH SENSITIVITY)    EKG EKG  Interpretation Date/Time:  Wednesday September 08 2023 12:42:24 EDT Ventricular Rate:  88 PR Interval:  138 QRS Duration:  88 QT Interval:  347 QTC Calculation: 420 R Axis:   -13  Text Interpretation: Sinus rhythm Left ventricular hypertrophy Anterolateral infarct, old no stemi Confirmed by Tanda Rockers ([id number]) on 09/08/2023 1:29:53 PM  Radiology CT Head Wo Contrast Result Date: 09/08/2023 CLINICAL DATA:  Mental status change, unknown cause. EXAM: CT HEAD WITHOUT CONTRAST TECHNIQUE: Contiguous axial images were obtained from the base of the skull through the vertex without intravenous contrast. RADIATION DOSE REDUCTION: This exam was performed according to the departmental dose-optimization program which includes automated exposure control, adjustment of the mA and/or kV according to patient size and/or use of iterative reconstruction technique. COMPARISON:  Head MRI 07/26/2020 and CT 09/09/2018 FINDINGS: Brain: There is no evidence of an acute infarct, intracranial hemorrhage, mass, midline shift, or extra-axial fluid collection. A left frontal approach ventriculostomy catheter courses through the frontal horn of the right lateral ventricle and terminates near the genu of the right internal capsule, unchanged. The ventricles are nondilated and unchanged in size from the prior MRI. Mild gliosis is again noted in the bifrontal white matter. Vascular: Calcified atherosclerosis at the skull base. No hyperdense vessel. Skull: Bilateral frontal burr holes. Sinuses/Orbits: Small mucous retention cysts in the maxillary sinuses. Clear mastoid air cells. Unremarkable orbits. Other: None. IMPRESSION: 1. No evidence of acute intracranial abnormality. 2. Unchanged size of the  ventricles with shunt in place. Electronically Signed   By: Sebastian Ache M.D.   On: 09/08/2023 17:48   DG Chest 2 View Result Date: 09/08/2023 CLINICAL DATA:  Chest pain.  Altered mental status and dizziness. EXAM: CHEST - 2 VIEW COMPARISON:   Chest radiograph dated 08/19/2023. FINDINGS: No focal consolidation, pleural effusion, or pneumothorax. The cardiac silhouette is within limits. No acute osseous pathology. Upper thoracic spinal rod. Partially visualized VP shunt over the left chest. IMPRESSION: No active cardiopulmonary disease. Electronically Signed   By: Elgie Collard M.D.   On: 09/08/2023 16:02    Procedures Procedures  {Document cardiac monitor, telemetry assessment procedure when appropriate:1}  Medications Ordered in ED Medications - No data to display  ED Course/ Medical Decision Making/ A&P                          Medical Decision Making Amount and/or Complexity of Data Reviewed Labs: ordered. Decision-making details documented in ED Course. Radiology: ordered. Decision-making details documented in ED Course.    This patient presents to the ED for concern of ***, this involves an extensive number of treatment options, and is a complaint that carries with it a high risk of complications and morbidity.  I considered the following differential and admission for this acute, potentially life threatening condition.   MDM:    DDX for chest pain includes but is not limited to:  ACS/arrhythmia,  PE, aortic dissection, PNA, PTX, esophogeal rupture, biliary disease, cardiac tamponade, pericarditis, GERD/PUD/gastritis, or musculoskeletal pain. Very low suspicion for ACS vs aortic dissection given presenting sx. Patient cannot PERC out based on tachycardia, but will obtain D dimer and reassess, minimal risk factors for PE. No c/f dissection. No abdominal pain and no c/f biliary disease. Consider GERD, given known history.   Ddx of acute altered mental status or encephalopathy considered but not limited to: -Intracranial abnormalities such as ICH, hydrocephalus, head trauma -Infection such as UTI, PNA, or meningitis -Toxic ingestion such as opioid overdose, anticholinergic toxicity, -Electrolyte abnormalities or  hyper/hypoglycemia -Hypercarbia or hypoxia -Hepatic encephalopathy or uremia -ACS or arrhythmia -Endocrine abnormality such as thyroid storm or myxedema coma   Clinical Course as of 09/08/23 1852  Wed Sep 08, 2023  1844 Ammonia: 22 neg [HN]  1844 Troponin I (High Sensitivity): 3 Neg x2, no active chest pain [HN]  1844 Rapid urine drug screen (hospital performed) neg [HN]  1844 Urinalysis, Routine w reflex microscopic -Urine, Clean Catch(!) No UTI [HN]  1844 Basic metabolic panel(!) Unremarkable in the context of this patient's presentation  [HN]  1844 CBC with Differential(!) Unremarkable in the context of this patient's presentation  [HN]  1844 CT Head Wo Contrast 1. No evidence of acute intracranial abnormality. 2. Unchanged size of the ventricles with shunt in place.   [HN]  1844 DG Chest 2 View No active cardiopulmonary disease. [HN]    Clinical Course User Index [HN] Loetta Rough, MD    Labs: I Ordered, and personally interpreted labs.  The pertinent results include:  those lsited above  Imaging Studies ordered: CTH and CXR ordered from triage. I ordered imaging studies including remainder of XR shunt series. I independently visualized and interpreted imaging. I agree with the radiologist interpretation  Additional history obtained from chart review, wife at bedside  Cardiac Monitoring: The patient was maintained on a cardiac monitor.  I personally viewed and interpreted the cardiac monitored which showed an underlying rhythm of: NSR  Reevaluation: After the interventions noted above, I reevaluated the patient and found that they have :{resolved/improved/worsened:23923::"improved"}  Social Determinants of Health: Lives independently  Disposition:  ***  Co morbidities that complicate the patient evaluation  Past Medical History:  Diagnosis Date   Abnormal cardiac CT angiography    Acid reflux    Annual physical exam 04/08/2015   Arthritis     Atypical chest pain 06/13/2020   Blood transfusion without reported diagnosis    Body mass index (BMI) 35.0-35.9, adult 04/05/2019   Chronic fatigue 01/28/2015   Chronic headaches    on cymbalta   Chronic kidney disease, stage 3b (HCC) 07/14/2023   Chronic migraine w/o aura, not intractable, w/o stat migr 10/24/2018   Cirrhosis (HCC)    Colon polyps    Complication of anesthesia    problems waking up from anesthesia   Coronary artery disease 01/23/2019   Depression    on cymbalta   Diabetes mellitus with neuropathy (HCC)    Diabetes with neuropathy 04/25/2013   Diverticulitis 03/2013   Dyslipidemia 05/29/2019   Eczema    Elevated LFTs    Epidural lipomatosis 10/05/2018   Essential (primary) hypertension 04/05/2019   Essential hypertension 10/10/2019   Fatty liver    GERD (gastroesophageal reflux disease) 04/28/2011   H/O craniotomy 05/07/2015   Headache 04/28/2011   Hepatitis 10/2017   NASH cirrhosis   History of kidney stones    Hyperlipidemia    Hypersomnia with sleep apnea 01/28/2015   Hypertension    IDA (iron deficiency anemia) 01/24/2019   Idiopathic intracranial hypertension 01/14/2017   Insomnia 04/26/2013   Kidney stone    Liver cirrhosis secondary to NASH (nonalcoholic steatohepatitis) (HCC) 01/02/2016   Low back pain 04/05/2019   Lower back injury 08/14/2019   Morbid obesity (HCC)    Neuromuscular disorder (HCC)    neuropathy   Neuropathy    Nonalcoholic steatohepatitis 10/05/2018   Obstructive hydrocephalus (HCC) 01/28/2015   OSA -- dx ~ 2012, cpap intolerant 09/04/2014    dx ~ 2012, cpap intolerant    PCP NOTES >>> 04/08/2015   Post-op pain 03/19/2019   Post-traumatic hydrocephalus (HCC)    s/p shunts x 2 (first got infected )   Presence of cerebrospinal fluid drainage device 07/28/2011   Psoriasis    sees Dr Lenis Noon   Psoriatic arthritis (HCC)    REM behavioral disorder 01/14/2017   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   see's  Dr.Ortmann   Severe obesity (BMI >= 40) (HCC) 01/28/2015   SI (sacroiliac) joint dysfunction 08/14/2019   Sigmoid diverticulitis 04/25/2013   Sleep apnea    no CPAP      Spondylolisthesis, lumbar region 03/16/2019   Stomach ulcer    Testosterone deficiency 04/28/2011   VP (ventriculoperitoneal) shunt status 07/31/2020     Medicines No orders of the defined types were placed in this encounter.   I have reviewed the patients home medicines and have made adjustments as needed  Problem List / ED Course: Problem List Items Addressed This Visit   None Visit Diagnoses       Chest pain, unspecified type    -  Primary            {Document critical care time when appropriate:1} {Document review of labs and clinical decision tools ie heart score, Chads2Vasc2 etc:1}  {Document your independent review of radiology images, and any outside records:1} {Document your discussion with family members, caretakers, and with consultants:1} {Document social determinants of health  affecting pt's care:1} {Document your decision making why or why not admission, treatments were needed:1}  This note was created using dictation software, which may contain spelling or grammatical errors.

## 2023-09-08 NOTE — Discharge Instructions (Signed)
 Thank you for coming to [facility] Emergency Department. You were seen for chest pain. We did an exam, labs, and imaging, and these showed no acute findings. Please call your cardiologist and follow up within 1-2 weeks. Please follow up with your primary care provider within 1 week.   Attached are resources for counseling and stress reduction. Therapy can only help, it cannot hurt.  Do not hesitate to return to the ED or call 911 if you experience: -Worsening symptoms -Chest pain, shortness of breath -Lightheadedness, passing out -Fevers/chills -Anything else that concerns you

## 2023-09-08 NOTE — ED Triage Notes (Addendum)
 Pt reports CP last night and into this morning; wife reports she has noticed a change in pt's behavior since around December when she discovered pt had been scammed for $40,000.00 on the internet; he could not explain this to her; last night she found out "he did it again" and lost $120.00 so she asked him to leave; he ended up spending the night in their building and was behaving bizarrely this morning in a way that frightened their son with special needs; A & O x 4 in triage and denies SI/HI

## 2023-09-09 ENCOUNTER — Other Ambulatory Visit: Payer: Self-pay

## 2023-09-09 ENCOUNTER — Encounter: Payer: Self-pay | Admitting: Cardiology

## 2023-09-09 ENCOUNTER — Encounter: Payer: Self-pay | Admitting: Internal Medicine

## 2023-09-09 NOTE — Progress Notes (Signed)
 Patient may be eligible for [employer] assist for Humira. Copay with Medicare is currently $1,095. He does not want to enroll in [facility] payment plan. Sending him the PAP form. He will need to sign and have his provider sign. Form will then need to be faxed to [employer].   If patient is approved for assistance he will then be eligible to receive medication from the program for a calendar year term and he will not be allowed to purchase this medication under his [employer] plan while enrolled in the program.

## 2023-09-10 ENCOUNTER — Ambulatory Visit: Attending: Cardiology | Admitting: Cardiology

## 2023-09-10 ENCOUNTER — Other Ambulatory Visit (HOSPITAL_BASED_OUTPATIENT_CLINIC_OR_DEPARTMENT_OTHER): Payer: Self-pay

## 2023-09-10 ENCOUNTER — Encounter: Payer: Self-pay | Admitting: Cardiology

## 2023-09-10 VITALS — BP 110/74 | HR 93 | Ht 71.0 in | Wt 229.4 lb

## 2023-09-10 DIAGNOSIS — I25118 Atherosclerotic heart disease of native coronary artery with other forms of angina pectoris: Secondary | ICD-10-CM

## 2023-09-10 DIAGNOSIS — K7581 Nonalcoholic steatohepatitis (NASH): Secondary | ICD-10-CM

## 2023-09-10 DIAGNOSIS — K7460 Unspecified cirrhosis of liver: Secondary | ICD-10-CM

## 2023-09-10 DIAGNOSIS — R0789 Other chest pain: Secondary | ICD-10-CM

## 2023-09-10 DIAGNOSIS — I10 Essential (primary) hypertension: Secondary | ICD-10-CM | POA: Diagnosis not present

## 2023-09-10 MED ORDER — NITROGLYCERIN 0.4 MG SL SUBL
0.4000 mg | SUBLINGUAL_TABLET | SUBLINGUAL | 6 refills | Status: DC | PRN
  Filled 2023-09-10: qty 25, 1d supply, fill #0

## 2023-09-10 MED ORDER — NITROGLYCERIN 0.4 MG SL SUBL
0.4000 mg | SUBLINGUAL_TABLET | SUBLINGUAL | 6 refills | Status: AC | PRN
  Filled 2023-09-10: qty 25, 7d supply, fill #0

## 2023-09-10 NOTE — Progress Notes (Signed)
**Note James-Identified via Obfuscation**  Cardiology Office Note:    Date:  09/10/2023   ID:  James [name], DOB 1955/09/17, MRN [id number]  PCP:  Wanda Plump, MD  Cardiologist:  Gypsy Balsam, MD    Referring MD: Wanda Plump, MD   Chief Complaint  Patient presents with   Follow-up    History of Present Illness:    James [name] is a 68 y.o. male past medical history significant for coronary artery disease status post PTCA of LAD in 2022, additional problem include diabetes, essential hypertension, hypertension, dyslipidemia, nonalcoholic cirrhosis of the liver.  He was admitted to the hospital on 15 August 2021 because of the emergent chest.  Cardiac catheterization has been performed was find to have some noncritical blockages none of this was amendable for intervention.  He was discharged home.  Comes today to my office for follow-up recently there is some social issue there is some change in his behavior.  Apparently he met some woman on line and spent $40,000, his wife also noticed some change in his behavior.  Recently he end up going to the emergency room because of chest pain cardiac enzymes were negative.  What triggered this episode of chest pain was defied with his wife kicked him out of the bedroom.  He sleeps in different room right now.  He tells me that he does develop some chest pain typically at night.  Does not happen during the day when he walks.  Past Medical History:  Diagnosis Date   Abnormal cardiac CT angiography    Acid reflux    Annual physical exam 04/08/2015   Arthritis    Atypical chest pain 06/13/2020   Blood transfusion without reported diagnosis    Body mass index (BMI) 35.0-35.9, adult 04/05/2019   Chronic fatigue 01/28/2015   Chronic headaches    on cymbalta   Chronic kidney disease, stage 3b (HCC) 07/14/2023   Chronic migraine w/o aura, not intractable, w/o stat migr 10/24/2018   Cirrhosis (HCC)    Colon polyps    Complication of anesthesia    problems waking up from  anesthesia   Coronary artery disease 01/23/2019   Depression    on cymbalta   Diabetes mellitus with neuropathy (HCC)    Diabetes with neuropathy 04/25/2013   Diverticulitis 03/2013   Dyslipidemia 05/29/2019   Eczema    Elevated LFTs    Epidural lipomatosis 10/05/2018   Essential (primary) hypertension 04/05/2019   Essential hypertension 10/10/2019   Fatty liver    GERD (gastroesophageal reflux disease) 04/28/2011   H/O craniotomy 05/07/2015   Headache 04/28/2011   Hepatitis 10/2017   NASH cirrhosis   History of kidney stones    Hyperlipidemia    Hypersomnia with sleep apnea 01/28/2015   Hypertension    IDA (iron deficiency anemia) 01/24/2019   Idiopathic intracranial hypertension 01/14/2017   Insomnia 04/26/2013   Kidney stone    Liver cirrhosis secondary to NASH (nonalcoholic steatohepatitis) (HCC) 01/02/2016   Low back pain 04/05/2019   Lower back injury 08/14/2019   Morbid obesity (HCC)    Neuromuscular disorder (HCC)    neuropathy   Neuropathy    Nonalcoholic steatohepatitis 10/05/2018   Obstructive hydrocephalus (HCC) 01/28/2015   OSA -- dx ~ 2012, cpap intolerant 09/04/2014    dx ~ 2012, cpap intolerant    PCP NOTES >>> 04/08/2015   Post-op pain 03/19/2019   Post-traumatic hydrocephalus (HCC)    s/p shunts x 2 (first got infected )   Presence of  cerebrospinal fluid drainage device 07/28/2011   Psoriasis    sees Dr Lenis Noon   Psoriatic arthritis (HCC)    REM behavioral disorder 01/14/2017   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   see's Dr.Ortmann   Severe obesity (BMI >= 40) (HCC) 01/28/2015   SI (sacroiliac) joint dysfunction 08/14/2019   Sigmoid diverticulitis 04/25/2013   Sleep apnea    no CPAP      Spondylolisthesis, lumbar region 03/16/2019   Stomach ulcer    Testosterone deficiency 04/28/2011   VP (ventriculoperitoneal) shunt status 07/31/2020    Past Surgical History:  Procedure Laterality Date   AMPUTATION Left 12/27/2021    Procedure: AMPUTATION GREAT TOE;  Surgeon: Vivi Barrack, DPM;  Location: [facility] OR;  Service: Podiatry;  Laterality: Left;   BACK SURGERY  1980   BRAIN SURGERY     VP shunts placed in 2007   CHOLECYSTECTOMY N/A 08/25/2017   Procedure: LAPAROSCOPIC CHOLECYSTECTOMY WITH INTRAOPERATIVE CHOLANGIOGRAM;  Surgeon: Griselda Miner, MD;  Location: [facility] OR;  Service: General;  Laterality: N/A;   COLONOSCOPY     CORONARY STENT INTERVENTION N/A 09/27/2020   Procedure: CORONARY STENT INTERVENTION;  Surgeon: Corky Crafts, MD;  Location: [facility] INVASIVE CV LAB;  Service: Cardiovascular;  Laterality: N/A;   CORONARY ULTRASOUND/IVUS N/A 09/27/2020   Procedure: Intravascular Ultrasound/IVUS;  Surgeon: Corky Crafts, MD;  Location: [facility] INVASIVE CV LAB;  Service: Cardiovascular;  Laterality: N/A;   JOINT REPLACEMENT     total hip   LEFT HEART CATH N/A 09/27/2020   Procedure: Left Heart Cath;  Surgeon: Corky Crafts, MD;  [facility] INVASIVE CV LAB;  Service: Cardiovascular;  Laterality: N/A;   LEFT HEART CATH AND CORONARY ANGIOGRAPHY N/A 09/24/2020   Procedure: LEFT HEART CATH AND CORONARY ANGIOGRAPHY;  Surgeon: Corky Crafts, MD;  Location: [facility] INVASIVE CV LAB;  Service: Cardiovascular;  Laterality: N/A;   LEFT HEART CATH AND CORONARY ANGIOGRAPHY N/A 08/18/2021   Procedure: LEFT HEART CATH AND CORONARY ANGIOGRAPHY;  Surgeon: Lennette Bihari, MD;  Location: [facility] INVASIVE CV LAB;  Service: Cardiovascular;  Laterality: N/A;   LUMBAR FUSION  03/16/2019   SHOULDER SURGERY Left 2010   TEE WITHOUT CARDIOVERSION N/A 01/01/2022   Procedure: TRANSESOPHAGEAL ECHOCARDIOGRAM (TEE);  Surgeon: Thomasene Ripple, DO;  Location: [facility] ENDOSCOPY;  Service: Cardiovascular;  Laterality: N/A;   TOE SURGERY Left 2018   TONSILLECTOMY     as a child   TOTAL HIP ARTHROPLASTY Left 2011   UPPER GASTROINTESTINAL ENDOSCOPY  01/04/2020   VENTRICULOPERITONEAL SHUNT  2007   x2    Current Medications: Current Meds  Medication Sig    acetaminophen (TYLENOL) 325 MG tablet Take 2 tablets (650 mg total) by mouth every 4 (four) hours as needed for headache or mild pain.   adalimumab (HUMIRA, 2 PEN,) 40 MG/0.8ML AJKT pen Inject 40mg  (0.33ml) under the skin every other week. (Patient taking differently: Inject 40 mg into the skin every 14 (fourteen) days.)   adalimumab (HUMIRA, 2 PEN,) 40 MG/0.8ML AJKT pen Inject 0.8 mLs (40 mg total) into the skin every 14 (fourteen) days.   amLODipine (NORVASC) 5 MG tablet Take 1 tablet (5 mg total) by mouth daily.   aspirin 81 MG chewable tablet Chew 1 tablet (81 mg total) by mouth daily.   atorvastatin (LIPITOR) 80 MG tablet Take 1 tablet (80 mg total) by mouth at bedtime.   botulinum toxin Type A (BOTOX) 200 units injection Provider to inject 155 units into the muscles of  the head and neck every 3 months. Discard remainder. (Patient taking differently: Inject 200 Units into the muscle every 3 (three) months. Provider to inject 155 units into the muscles of the head and neck every 3 months. Discard remainder.)   carvedilol (COREG) 12.5 MG tablet Take 1 tablet (12.5 mg total) by mouth 2 (two) times daily with a meal.   clobetasol (TEMOVATE) 0.05 % external solution Apply 1 application externally twice a day 14 days. (Patient taking differently: Apply 1 Application topically 2 (two) times daily as needed (rash).)   clonazePAM (KLONOPIN) 0.5 MG tablet Take 1 tablet (0.5 mg total) by mouth at bedtime.   Continuous Glucose Sensor (DEXCOM G6 SENSOR) MISC Use to monitor blood sugar, change after 10 days (Patient taking differently: 1 each by Other route See admin instructions. Use to monitor blood sugar, change after 10 days)   Continuous Glucose Transmitter (DEXCOM G6 TRANSMITTER) MISC 1 Device by Does not apply route continuous.   dicyclomine (BENTYL) 20 MG tablet Take 1 tablet (20 mg total) by mouth 2 (two) times daily as needed. (Patient taking differently: Take 20 mg by mouth 2 (two) times daily as  needed for spasms.)   DULoxetine (CYMBALTA) 60 MG capsule Take 2 capsules (120 mg total) by mouth daily.   empagliflozin (JARDIANCE) 10 MG TABS tablet Take 1 tablet (10 mg total) by mouth daily with breakfast.   EPINEPHrine (EPIPEN 2-PAK) 0.3 mg/0.3 mL IJ SOAJ injection Inject 0.3 mLs (0.3 mg total) into the muscle once as needed for up to 1 dose for anaphylaxis.   gabapentin (NEURONTIN) 600 MG tablet Take 1 tablet (600 mg total) by mouth 2 (two) times daily.   Insulin Disposable Pump (OMNIPOD 5 G6 PODS, GEN 5,) MISC Use as directed every 3 (three) days.   insulin lispro (HUMALOG) 100 UNIT/ML injection Use up to 60 Units daily in pump (Patient taking differently: Inject 60 Units into the skin See admin instructions. Use up to 60 Units daily in pump)   Insulin Syringe-Needle U-100 (INSULIN SYRINGE 1CC/31GX5/16") 31G X 5/16" 1 ML MISC Use to administer Humalog 3 times a day (Patient taking differently: 1 tablet by Other route See admin instructions. Use to administer Humalog 3 times a day)   isosorbide mononitrate (IMDUR) 120 MG 24 hr tablet Take 1 tablet (120 mg total) by mouth daily.   metFORMIN (GLUCOPHAGE) 500 MG tablet Take 1 tablet (500 mg total) by mouth 2 (two) times daily.   nitroGLYCERIN (NITROSTAT) 0.4 MG SL tablet Place 1 tablet (0.4 mg total) under the tongue every 5 (five) minutes for 3 doses as needed for chest pain.   pantoprazole (PROTONIX) 40 MG tablet Take 1 tablet (40 mg total) by mouth daily.   traMADol (ULTRAM) 50 MG tablet Take 1 tablet (50 mg total) by mouth every 6 (six) hours as needed for up to 10 doses for severe pain.   Current Facility-Administered Medications for the 09/10/23 encounter (Office Visit) with Georgeanna Lea, MD  Medication   botulinum toxin Type A (BOTOX) injection 155 Units   botulinum toxin Type A (BOTOX) injection 155 Units     Allergies:   Bee venom, Hydrocodone bit-homatrop mbr, Toradol [ketorolac tromethamine], Prednisone, Sulfadiazine, Morphine  and codeine, and Sulfa drugs cross reactors   Social History   Socioeconomic History   Marital status: Married    Spouse name: Burna Mortimer   Number of children: 2   Years of education: Not on file   Highest education level: 12th grade  Occupational History   Occupation: disabled   Tobacco Use   Smoking status: Never   Smokeless tobacco: Never  Vaping Use   Vaping status: Never Used  Substance and Sexual Activity   Alcohol use: No   Drug use: No   Sexual activity: Yes    Partners: Female  Other Topics Concern   Not on file  Social History Narrative   Household-- pt , wife, one adult son with Down's syndrome   younger son lives in [place]   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   Social Drivers of Corporate investment banker Strain: Low Risk  (02/10/2023)   Overall Financial Resource Strain (CARDIA)    Difficulty of Paying Living Expenses: Not hard at all  Food Insecurity: No Food Insecurity (08/19/2023)   Hunger Vital Sign    Worried About Running Out of Food in the Last Year: Never true    Ran Out of Food in the Last Year: Never true  Transportation Needs: No Transportation Needs (08/19/2023)   PRAPARE - Administrator, Civil Service (Medical): No    Lack of Transportation (Non-Medical): No  Physical Activity: Unknown (02/10/2023)   Exercise Vital Sign    Days of Exercise per Week: 0 days    Minutes of Exercise per Session: Not on file  Stress: No Stress Concern Present (02/10/2023)   [employer] of Occupational Health - Occupational Stress Questionnaire    Feeling of Stress : Only a little  Social Connections: Moderately Isolated (08/20/2023)   Social Connection and Isolation Panel [NHANES]    Frequency of Communication with Friends and Family: Once a week    Frequency of Social Gatherings with Friends and Family: More than three times a week    Attends Religious Services: Never    Database administrator or  Organizations: No    Attends Engineer, structural: Never    Marital Status: Married     Family History: The patient's family history includes Down syndrome in his son; Heart disease in his brother; Lung cancer in his father; Other in his brother and mother. There is no history of Diabetes, Prostate cancer, Colon cancer, Stomach cancer, Pancreatic cancer, or Liver disease. ROS:   Please see the history of present illness.    All 14 point review of systems negative except as described per history of present illness  EKGs/Labs/Other Studies Reviewed:    EKG Interpretation Date/Time:  Friday September 10 2023 15:51:01 EDT Ventricular Rate:  93 PR Interval:  134 QRS Duration:  84 QT Interval:  346 QTC Calculation: 430 R Axis:   -18  Text Interpretation: Normal sinus rhythm Inferior infarct , age undetermined Abnormal ECG When compared with ECG of 08-Sep-2023 12:42, PREVIOUS ECG IS PRESENT Confirmed by Gypsy Balsam [id number]) on 09/10/2023 3:59:06 PM    Recent Labs: 08/19/2023: TSH 1.693 08/30/2023: Magnesium 1.9 09/08/2023: ALT 30; BUN 18; Creatinine, Ser 1.02; Hemoglobin 15.3; Platelets 167; Potassium 4.1; Sodium 136  Recent Lipid Panel    Component Value Date/Time   CHOL 132 01/20/2023 1031   CHOL 152 10/30/2020 0920   TRIG 145 08/20/2023 0529   HDL 41.00 01/20/2023 1031   HDL 39 (L) 10/30/2020 0920   CHOLHDL 3 01/20/2023 1031   VLDL 48.0 (H) 01/20/2023 1031   LDLCALC 68 01/30/2022 1007   LDLCALC 68 10/30/2020 0920   LDLDIRECT 59.0 01/20/2023 1031    Physical Exam:    VS:  BP 110/74 (  BP Location: Right Arm, Patient Position: Sitting)   Pulse 93   Ht 5\' 11"  (1.803 m)   Wt 229 lb 6.4 oz (104.1 kg)   SpO2 92%   BMI 31.99 kg/m     Wt Readings from Last 3 Encounters:  09/10/23 229 lb 6.4 oz (104.1 kg)  09/08/23 223 lb 1.7 oz (101.2 kg)  09/06/23 223 lb (101.2 kg)     GEN:  Well nourished, well developed in no acute distress HEENT: Normal NECK: No JVD; No  carotid bruits LYMPHATICS: No lymphadenopathy CARDIAC: RRR, no murmurs, no rubs, no gallops RESPIRATORY:  Clear to auscultation without rales, wheezing or rhonchi  ABDOMEN: Soft, non-tender, non-distended MUSCULOSKELETAL:  No edema; No deformity  SKIN: Warm and dry LOWER EXTREMITIES: no swelling NEUROLOGIC:  Alert and oriented x 3 PSYCHIATRIC:  Normal affect   ASSESSMENT:    1. Essential (primary) hypertension   2. Coronary artery disease involving native coronary artery of native heart with other form of angina pectoris (HCC)   3. Liver cirrhosis secondary to NASH (nonalcoholic steatohepatitis) (HCC)   4. Atypical chest pain    PLAN:    In order of problems listed above:  Chest pain somewhat atypical characteristic I will give him a new prescription for nitroglycerin.  Will schedule him to have Lexiscan to see if he get any inducible ischemia. Essential hypertension: Blood pressure well-controlled continue present management. Series of the liver.  Not had follow-up by internal medicine team. Dyslipidemia I did review K PN which show me LDL 68 HDL 41 this is from summer of last year continue present management   Medication Adjustments/Labs and Tests Ordered: Current medicines are reviewed at length with the patient today.  Concerns regarding medicines are outlined above.  Orders Placed This Encounter  Procedures   EKG 12-Lead   Medication changes: No orders of the defined types were placed in this encounter.   Signed, Georgeanna Lea, MD, [facility] 09/10/2023 4:10 PM    [facility]

## 2023-09-10 NOTE — Patient Instructions (Signed)
Medication Instructions:  Your physician recommends that you continue on your current medications as directed. Please refer to the Current Medication list given to you today.  *If you need a refill on your cardiac medications before your next appointment, please call your pharmacy*   Lab Work: None Ordered If you have labs (blood work) drawn today and your tests are completely normal, you will receive your results only by: MyChart Message (if you have MyChart) OR A paper copy in the mail If you have any lab test that is abnormal or we need to change your treatment, we will call you to review the results.   Testing/Procedures: Your physician has requested that you have a lexiscan myoview. For further information please visit [email or web address]. Please follow instruction sheet, as given.  The test will take approximately 3 to 4 hours to complete; you may bring reading material.  If someone comes with you to your appointment, they will need to remain in the main lobby due to limited space in the testing area.   How to prepare for your Myocardial Perfusion Test: Do not eat or drink 3 hours prior to your test, except you may have water. Do not consume products containing caffeine (regular or decaffeinated) 12 hours prior to your test. (ex: coffee, chocolate, sodas, tea). Do bring a list of your current medications with you.  If not listed below, you may take your medications as normal. Do wear comfortable clothes (no dresses or overalls) and walking shoes, tennis shoes preferred (No heels or open toe shoes are allowed). Do NOT wear cologne, perfume, aftershave, or lotions (deodorant is allowed). If these instructions are not followed, your test will have to be rescheduled.     Follow-Up: At [facility], you and your health needs are our priority.  As part of our continuing mission to provide you with exceptional heart care, we have created designated Provider Care Teams.  These Care Teams  include your primary Cardiologist (physician) and Advanced Practice Providers (APPs -  Physician Assistants and Nurse Practitioners) who all work together to provide you with the care you need, when you need it.  We recommend signing up for the patient portal called "MyChart".  Sign up information is provided on this After Visit Summary.  MyChart is used to connect with patients for Virtual Visits (Telemedicine).  Patients are able to view lab/test results, encounter notes, upcoming appointments, etc.  Non-urgent messages can be sent to your provider as well.   To learn more about what you can do with MyChart, go to [email or web address].    Your next appointment:   6 month(s)  The format for your next appointment:   In Person  Provider:   Robert Krasowski, MD    Other Instructions NA  

## 2023-09-13 ENCOUNTER — Encounter: Payer: Self-pay | Admitting: Internal Medicine

## 2023-09-13 ENCOUNTER — Ambulatory Visit (INDEPENDENT_AMBULATORY_CARE_PROVIDER_SITE_OTHER): Admitting: Internal Medicine

## 2023-09-13 VITALS — BP 120/72 | HR 78 | Temp 98.1°F | Resp 18 | Ht 71.0 in | Wt 232.4 lb

## 2023-09-13 DIAGNOSIS — Z5986 Financial insecurity: Secondary | ICD-10-CM | POA: Diagnosis not present

## 2023-09-13 DIAGNOSIS — F32A Depression, unspecified: Secondary | ICD-10-CM | POA: Diagnosis not present

## 2023-09-13 DIAGNOSIS — E1140 Type 2 diabetes mellitus with diabetic neuropathy, unspecified: Secondary | ICD-10-CM

## 2023-09-13 DIAGNOSIS — Z7984 Long term (current) use of oral hypoglycemic drugs: Secondary | ICD-10-CM

## 2023-09-13 DIAGNOSIS — R4689 Other symptoms and signs involving appearance and behavior: Secondary | ICD-10-CM

## 2023-09-13 NOTE — Progress Notes (Unsigned)
 Subjective:    Patient ID: James [name], male    DOB: 1956/01/12, 68 y.o.   MRN: [id number]  DOS:  09/13/2023 Type of visit - description: ER f/u  Evaluated at the ER 09/08/2023:  - Chest pain. - Wife also reported a change in behavior.  Apparently he has been online, speaking with different woman, went to [place] and came back "a completely different person". Gave away as much as $40,000 to females on-line (since ~ 04/2023)  Today, I saw the patient and his wife was on speaker phone. She confirmed that he has been meeting online with different movement and giving money away. The patient did not deny this. "I do not know exactly what happened". James [name], patient's wife, likes to be sure nothing is wrong medically. The patient has occasional anxiety and depression but nothing new. She denies major problems with headache, no nausea vomiting. Denies any low CBGs. The patient's wife reports he has not been confused or incoherent. No substance abuse including no tobacco-CBD-alcohol-other drugs. I ask about safety: wife has removed guns from the house, neither one of them has any violent or suicidal ideas.  W/u at the ER  Creatinine 1.0, potassium 4.1, AST ALT normal.  Bilirubin slightly elevated, ammonia normal.. CBC essentially normal. Troponin negative Chest x-ray negative CT head without: No acute.  Shunt in place.  Ventricle size is unchanged.  Urinalysis negative, Subsequently saw cardiology, they ordered a stress test.  Review of Systems See above   Past Medical History:  Diagnosis Date   Abnormal cardiac CT angiography    Acid reflux    Annual physical exam 04/08/2015   Arthritis    Atypical chest pain 06/13/2020   Blood transfusion without reported diagnosis    Body mass index (BMI) 35.0-35.9, adult 04/05/2019   Chronic fatigue 01/28/2015   Chronic headaches    on cymbalta   Chronic kidney disease, stage 3b (HCC) 07/14/2023   Chronic migraine w/o aura, not  intractable, w/o stat migr 10/24/2018   Cirrhosis (HCC)    Colon polyps    Complication of anesthesia    problems waking up from anesthesia   Coronary artery disease 01/23/2019   Depression    on cymbalta   Diabetes mellitus with neuropathy (HCC)    Diabetes with neuropathy 04/25/2013   Diverticulitis 03/2013   Dyslipidemia 05/29/2019   Eczema    Elevated LFTs    Epidural lipomatosis 10/05/2018   Essential (primary) hypertension 04/05/2019   Essential hypertension 10/10/2019   Fatty liver    GERD (gastroesophageal reflux disease) 04/28/2011   H/O craniotomy 05/07/2015   Headache 04/28/2011   Hepatitis 10/2017   NASH cirrhosis   History of kidney stones    Hyperlipidemia    Hypersomnia with sleep apnea 01/28/2015   Hypertension    IDA (iron deficiency anemia) 01/24/2019   Idiopathic intracranial hypertension 01/14/2017   Insomnia 04/26/2013   Kidney stone    Liver cirrhosis secondary to NASH (nonalcoholic steatohepatitis) (HCC) 01/02/2016   Low back pain 04/05/2019   Lower back injury 08/14/2019   Morbid obesity (HCC)    Neuromuscular disorder (HCC)    neuropathy   Neuropathy    Nonalcoholic steatohepatitis 10/05/2018   Obstructive hydrocephalus (HCC) 01/28/2015   OSA -- dx ~ 2012, cpap intolerant 09/04/2014    dx ~ 2012, cpap intolerant    PCP NOTES >>> 04/08/2015   Post-op pain 03/19/2019   Post-traumatic hydrocephalus (HCC)    s/p shunts x 2 (first got infected )  Presence of cerebrospinal fluid drainage device 07/28/2011   Psoriasis    sees Dr Lenis Noon   Psoriatic arthritis (HCC)    REM behavioral disorder 01/14/2017   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   see's Dr.Ortmann   Severe obesity (BMI >= 40) (HCC) 01/28/2015   SI (sacroiliac) joint dysfunction 08/14/2019   Sigmoid diverticulitis 04/25/2013   Sleep apnea    no CPAP      Spondylolisthesis, lumbar region 03/16/2019   Stomach ulcer    Testosterone deficiency 04/28/2011   VP  (ventriculoperitoneal) shunt status 07/31/2020    Past Surgical History:  Procedure Laterality Date   AMPUTATION Left 12/27/2021   Procedure: AMPUTATION GREAT TOE;  Surgeon: Vivi Barrack, DPM;  Location: [facility] OR;  Service: Podiatry;  Laterality: Left;   BACK SURGERY  1980   BRAIN SURGERY     VP shunts placed in 2007   CHOLECYSTECTOMY N/A 08/25/2017   Procedure: LAPAROSCOPIC CHOLECYSTECTOMY WITH INTRAOPERATIVE CHOLANGIOGRAM;  Surgeon: Griselda Miner, MD;  Location: [facility] OR;  Service: General;  Laterality: N/A;   COLONOSCOPY     CORONARY STENT INTERVENTION N/A 09/27/2020   Procedure: CORONARY STENT INTERVENTION;  Surgeon: Corky Crafts, MD;  Location: [facility] INVASIVE CV LAB;  Service: Cardiovascular;  Laterality: N/A;   CORONARY ULTRASOUND/IVUS N/A 09/27/2020   Procedure: Intravascular Ultrasound/IVUS;  Surgeon: Corky Crafts, MD;  Location: [facility] INVASIVE CV LAB;  Service: Cardiovascular;  Laterality: N/A;   JOINT REPLACEMENT     total hip   LEFT HEART CATH N/A 09/27/2020   Procedure: Left Heart Cath;  Surgeon: Corky Crafts, MD;  Location: [facility] INVASIVE CV LAB;  Service: Cardiovascular;  Laterality: N/A;   LEFT HEART CATH AND CORONARY ANGIOGRAPHY N/A 09/24/2020   Procedure: LEFT HEART CATH AND CORONARY ANGIOGRAPHY;  Surgeon: Corky Crafts, MD;  Location: [facility] INVASIVE CV LAB;  Service: Cardiovascular;  Laterality: N/A;   LEFT HEART CATH AND CORONARY ANGIOGRAPHY N/A 08/18/2021   Procedure: LEFT HEART CATH AND CORONARY ANGIOGRAPHY;  Surgeon: Lennette Bihari, MD;  Location: [facility] INVASIVE CV LAB;  Service: Cardiovascular;  Laterality: N/A;   LUMBAR FUSION  03/16/2019   SHOULDER SURGERY Left 2010   TEE WITHOUT CARDIOVERSION N/A 01/01/2022   Procedure: TRANSESOPHAGEAL ECHOCARDIOGRAM (TEE);  Surgeon: Thomasene Ripple, DO;  Location: [facility] ENDOSCOPY;  Service: Cardiovascular;  Laterality: N/A;   TOE SURGERY Left 2018   TONSILLECTOMY     as a child   TOTAL HIP ARTHROPLASTY Left 2011   UPPER  GASTROINTESTINAL ENDOSCOPY  01/04/2020   VENTRICULOPERITONEAL SHUNT  2007   x2    Current Outpatient Medications  Medication Instructions   acetaminophen (TYLENOL) 650 mg, Oral, Every 4 hours PRN   amLODipine (NORVASC) 5 mg, Oral, Daily   aspirin 81 mg, Oral, Daily   atorvastatin (LIPITOR) 80 mg, Oral, Daily at bedtime   botulinum toxin Type A (BOTOX) 200 units injection Provider to inject 155 units into the muscles of the head and neck every 3 months. Discard remainder.   carvedilol (COREG) 12.5 mg, Oral, 2 times daily with meals   clobetasol (TEMOVATE) 0.05 % external solution Apply 1 application externally twice a day 14 days.   clonazePAM (KLONOPIN) 0.5 mg, Oral, Daily at bedtime   Continuous Glucose Sensor (DEXCOM G6 SENSOR) MISC Use to monitor blood sugar, change after 10 days   Continuous Glucose Transmitter (DEXCOM G6 TRANSMITTER) MISC 1 Device, Does not apply, Continuous   dicyclomine (BENTYL) 20 mg, Oral, 2 times daily PRN  DULoxetine (CYMBALTA) 120 mg, Oral, Daily   EPINEPHrine (EPIPEN 2-PAK) 0.3 mg, Intramuscular, Once PRN   gabapentin (NEURONTIN) 600 mg, Oral, 2 times daily   Humira (2 Pen) 40 mg, Subcutaneous, Every 14 days   Insulin Disposable Pump (OMNIPOD 5 G6 PODS, GEN 5,) MISC Use as directed every 3 (three) days.   insulin lispro (HUMALOG) 100 UNIT/ML injection Use up to 60 Units daily in pump   Insulin Syringe-Needle U-100 (INSULIN SYRINGE 1CC/31GX5/16") 31G X 5/16" 1 ML MISC Use to administer Humalog 3 times a day   isosorbide mononitrate (IMDUR) 120 mg, Oral, Daily   Jardiance 10 mg, Oral, Daily with breakfast   metFORMIN (GLUCOPHAGE) 500 mg, Oral, 2 times daily   nitroGLYCERIN (NITROSTAT) 0.4 MG SL tablet Place 1 tablet (0.4 mg total) under the tongue every 5 (five) minutes for 3 doses as needed for chest pain.   pantoprazole (PROTONIX) 40 mg, Oral, Daily   traMADol (ULTRAM) 50 mg, Oral, Every 6 hours PRN       Objective:   Physical Exam BP 120/72   Pulse  78   Temp 98.1 F (36.7 C) (Oral)   Resp 18   Ht 5\' 11"  (1.803 m)   Wt 232 lb 6 oz (105.4 kg)   SpO2 94%   BMI 32.41 kg/m  General:   Well developed, NAD, BMI noted. HEENT:  Normocephalic . Face symmetric, atraumatic Lower extremities: no pretibial edema bilaterally  Skin: Not pale. Not jaundice Neurologic:  alert & oriented X3.  Speech normal, gait appropriate for age and unassisted Psych--  Cognition and judgment appear intact.  Cooperative with normal attention span and concentration.  Behavior appropriate. No anxious or depressed appearing.      Assessment     Problem list: DM -Per Endo Neuropathy (x years, rx gaba 05-2014, w/u 11-2014  RPR neg, vit D-B12-Folic Acid wnl ); Saw Dr Allena Katz, NCS [phone] (see results) HTN: History of AKI with ARBs  CKD, used to see  nephrology, etiology- Hyperglycemia, intermittent NSAIDs, contrast exposures Hyperlipidemia (TG in the 500s 2016) OSA , dx 2012,  again 02-2015 Dr Dohmeier--> severe OSA, intolerant to CPAP, not interested on treatment (see OV 12-2022) Depression, insomnia: on Cymbalta NEURO: --Chronic headaches :on Cymbalta  --Posttraumatic hydrocephalus s/p 2 shunts (first got infected) MSK: on disability d/t back pain- HAs GI:  --GERD, diverticulitis 2014, h/o PUD -- NASH with cirrhosis per GI note 10/2017, s/p Hep A/B shots --Anemia: - felt to be d/t   GAVE (gastric antral vascular ectasia) and a inflammatory polyp, s/p  EGD 10/2018    -Work-up repeated 10/2019: EGD: GAVE  versus portal hypertensive gastropathy. Colonoscopy polyps.  Tubular adenoma. Gastric BX negative H. pylori, reactive changes. Psoriasis, psoriatic arthritis: used  HUMIRA  CAD: CP, PTCA of the LAD 09-2020; cath 07-2021 H/o urolithiasis Hypogonadism  Dx 2012, normal T 11-2014 (on no RX)  PLAN Chest pain: Since the ER visit recently no further pain, saw cardiology, they ordered stress test Unusual behavior: See HPI for details, the patient has been  engaging with different females online and giving money away. The patient's wife like to be sure is nothing medically wrong with him, at this point I do not believe he has a condition that clouded his judgment. Fortunately, there is no safe issue with no guns in the house and no thoughts of violence or suicide. My recommendation is to see a counselor and figure their relationship out. James [name] also interested on a psychiatry evaluation so we will  refer him. DM: No evidence of low sugars that could account for some behavioral issues. Med compliance: Apparently not taking medication as prescribed, gets medications once daily instead of twice daily for instance. Encourage compliance with medications as prescribed, also routine meal schedule which he is not followed. RTC 2 months  Time spent 30 min reviewing records, listening carefully to the wife side of the story, explaining why I do not think there is a medical problem behind the unusual behavior.

## 2023-09-13 NOTE — Patient Instructions (Signed)
Per our records you are due for your diabetic eye exam. Please contact your eye doctor to schedule an appointment. Please have them send copies of your office visit notes to us. Our fax number is [phone]. If you need a referral to an eye doctor please let us know.  

## 2023-09-14 ENCOUNTER — Other Ambulatory Visit: Payer: Self-pay | Admitting: Internal Medicine

## 2023-09-14 ENCOUNTER — Other Ambulatory Visit (HOSPITAL_COMMUNITY): Payer: Self-pay

## 2023-09-14 ENCOUNTER — Ambulatory Visit: Attending: Cardiology

## 2023-09-14 ENCOUNTER — Other Ambulatory Visit: Payer: Self-pay

## 2023-09-14 DIAGNOSIS — R0789 Other chest pain: Secondary | ICD-10-CM

## 2023-09-14 MED ORDER — DULOXETINE HCL 60 MG PO CPEP
120.0000 mg | ORAL_CAPSULE | Freq: Every day | ORAL | 1 refills | Status: DC
  Filled 2023-09-14: qty 180, 90d supply, fill #0

## 2023-09-14 MED ORDER — REGADENOSON 0.4 MG/5ML IV SOLN
0.4000 mg | Freq: Once | INTRAVENOUS | Status: AC
  Administered 2023-09-14: 0.4 mg via INTRAVENOUS

## 2023-09-14 MED ORDER — TECHNETIUM TC 99M TETROFOSMIN IV KIT
7.9000 | PACK | Freq: Once | INTRAVENOUS | Status: AC | PRN
  Administered 2023-09-14: 7.9 via INTRAVENOUS

## 2023-09-14 MED ORDER — TECHNETIUM TC 99M TETROFOSMIN IV KIT
24.9000 | PACK | Freq: Once | INTRAVENOUS | Status: AC | PRN
  Administered 2023-09-14: 24.9 via INTRAVENOUS

## 2023-09-14 MED ORDER — PANTOPRAZOLE SODIUM 40 MG PO TBEC
40.0000 mg | DELAYED_RELEASE_TABLET | Freq: Every day | ORAL | 1 refills | Status: DC
  Filled 2023-09-14: qty 90, 90d supply, fill #0
  Filled 2023-09-20: qty 90, 90d supply, fill #1

## 2023-09-14 NOTE — Assessment & Plan Note (Signed)
 Chest pain: Since the ER visit recently no further pain, saw cardiology, they ordered stress test Unusual behavior: See HPI for details, the patient has been engaging with different females online and giving money away. The patient's wife like to be sure is nothing medically wrong with him, at this point I do not believe he has a condition that clouded his judgment. Fortunately, there is no safe issue with no guns in the house and no thoughts of violence or suicide. My recommendation is to see a counselor and figure their relationship out. Burna Mortimer also interested on a psychiatry evaluation so we will refer him. DM: No evidence of low sugars that could account for some behavioral issues. Med compliance: Apparently not taking medication as prescribed, gets medications once daily instead of twice daily for instance. Encourage compliance with medications as prescribed, also routine meal schedule which he is not followed. RTC 2 months

## 2023-09-17 ENCOUNTER — Telehealth: Payer: Self-pay

## 2023-09-17 LAB — MYOCARDIAL PERFUSION IMAGING
LV dias vol: 95 mL (ref 62–150)
LV sys vol: 46 mL
Nuc Stress EF: 51 %
Peak HR: 81 {beats}/min
Rest HR: 71 {beats}/min
Rest Nuclear Isotope Dose: 7.9 mCi
SDS: 1
SRS: 3
SSS: 4
ST Depression (mm): 0 mm
Stress Nuclear Isotope Dose: 24.9 mCi
TID: 1.11

## 2023-09-17 NOTE — Telephone Encounter (Signed)
 Left message on My Chart with normal stress test results per Dr. Vanetta Shawl note. Routed to PCP.

## 2023-09-17 NOTE — Telephone Encounter (Signed)
 Pt viewed Stress test results on My Chart per Dr. Vanetta Shawl note. Routed to PCP.

## 2023-09-18 ENCOUNTER — Other Ambulatory Visit (HOSPITAL_COMMUNITY): Payer: Self-pay

## 2023-09-19 ENCOUNTER — Other Ambulatory Visit: Payer: Self-pay | Admitting: Internal Medicine

## 2023-09-20 ENCOUNTER — Other Ambulatory Visit: Payer: Self-pay | Admitting: Endocrinology

## 2023-09-20 ENCOUNTER — Other Ambulatory Visit (HOSPITAL_COMMUNITY): Payer: Self-pay

## 2023-09-20 ENCOUNTER — Other Ambulatory Visit: Payer: Self-pay

## 2023-09-20 MED ORDER — ATORVASTATIN CALCIUM 80 MG PO TABS
80.0000 mg | ORAL_TABLET | Freq: Every day | ORAL | 1 refills | Status: DC
  Filled 2023-09-20: qty 90, 90d supply, fill #0

## 2023-09-21 ENCOUNTER — Other Ambulatory Visit: Payer: Self-pay

## 2023-09-22 ENCOUNTER — Other Ambulatory Visit: Payer: Self-pay

## 2023-09-22 ENCOUNTER — Other Ambulatory Visit (HOSPITAL_COMMUNITY): Payer: Self-pay

## 2023-09-22 ENCOUNTER — Other Ambulatory Visit: Payer: Self-pay | Admitting: Internal Medicine

## 2023-09-22 MED ORDER — GABAPENTIN 600 MG PO TABS
600.0000 mg | ORAL_TABLET | Freq: Two times a day (BID) | ORAL | 1 refills | Status: DC
  Filled 2023-09-22 – 2023-09-27 (×2): qty 180, 90d supply, fill #0

## 2023-09-23 ENCOUNTER — Other Ambulatory Visit (HOSPITAL_COMMUNITY): Payer: Self-pay

## 2023-09-23 ENCOUNTER — Other Ambulatory Visit: Payer: Self-pay

## 2023-09-27 ENCOUNTER — Emergency Department (HOSPITAL_BASED_OUTPATIENT_CLINIC_OR_DEPARTMENT_OTHER)

## 2023-09-27 ENCOUNTER — Emergency Department (HOSPITAL_BASED_OUTPATIENT_CLINIC_OR_DEPARTMENT_OTHER)
Admission: EM | Admit: 2023-09-27 | Discharge: 2023-09-27 | Disposition: A | Discharge status: Home / Self Care | Attending: Emergency Medicine | Admitting: Emergency Medicine

## 2023-09-27 ENCOUNTER — Other Ambulatory Visit: Payer: Self-pay

## 2023-09-27 DIAGNOSIS — I129 Hypertensive chronic kidney disease with stage 1 through stage 4 chronic kidney disease, or unspecified chronic kidney disease: Secondary | ICD-10-CM | POA: Insufficient documentation

## 2023-09-27 DIAGNOSIS — K5730 Diverticulosis of large intestine without perforation or abscess without bleeding: Secondary | ICD-10-CM | POA: Diagnosis not present

## 2023-09-27 DIAGNOSIS — Z7982 Long term (current) use of aspirin: Secondary | ICD-10-CM | POA: Insufficient documentation

## 2023-09-27 DIAGNOSIS — R7989 Other specified abnormal findings of blood chemistry: Secondary | ICD-10-CM | POA: Insufficient documentation

## 2023-09-27 DIAGNOSIS — K7460 Unspecified cirrhosis of liver: Secondary | ICD-10-CM | POA: Diagnosis not present

## 2023-09-27 DIAGNOSIS — R1013 Epigastric pain: Secondary | ICD-10-CM | POA: Diagnosis not present

## 2023-09-27 DIAGNOSIS — Z794 Long term (current) use of insulin: Secondary | ICD-10-CM | POA: Insufficient documentation

## 2023-09-27 DIAGNOSIS — R197 Diarrhea, unspecified: Secondary | ICD-10-CM | POA: Diagnosis not present

## 2023-09-27 DIAGNOSIS — N183 Chronic kidney disease, stage 3 unspecified: Secondary | ICD-10-CM | POA: Insufficient documentation

## 2023-09-27 DIAGNOSIS — K7581 Nonalcoholic steatohepatitis (NASH): Secondary | ICD-10-CM | POA: Insufficient documentation

## 2023-09-27 DIAGNOSIS — D84821 Immunodeficiency due to drugs: Secondary | ICD-10-CM | POA: Diagnosis not present

## 2023-09-27 DIAGNOSIS — A09 Infectious gastroenteritis and colitis, unspecified: Secondary | ICD-10-CM | POA: Insufficient documentation

## 2023-09-27 DIAGNOSIS — Z79899 Other long term (current) drug therapy: Secondary | ICD-10-CM | POA: Diagnosis not present

## 2023-09-27 DIAGNOSIS — R110 Nausea: Secondary | ICD-10-CM | POA: Diagnosis not present

## 2023-09-27 DIAGNOSIS — D72829 Elevated white blood cell count, unspecified: Secondary | ICD-10-CM | POA: Insufficient documentation

## 2023-09-27 DIAGNOSIS — N200 Calculus of kidney: Secondary | ICD-10-CM | POA: Diagnosis not present

## 2023-09-27 DIAGNOSIS — K766 Portal hypertension: Secondary | ICD-10-CM | POA: Diagnosis not present

## 2023-09-27 LAB — URINALYSIS, ROUTINE W REFLEX MICROSCOPIC
Bilirubin Urine: NEGATIVE
Glucose, UA: >=500 mg/dL — AB
Hgb urine dipstick: NEGATIVE
Ketones, ur: NEGATIVE mg/dL
Leukocytes,Ua: NEGATIVE
Nitrite: NEGATIVE
Protein, ur: NEGATIVE mg/dL
Specific Gravity, Urine: 1.015 (ref 1.005–1.030)
pH: 5.5 (ref 5.0–8.0)

## 2023-09-27 LAB — CBC
HCT: 49.6 % (ref 39.0–52.0)
Hemoglobin: 16.4 g/dL (ref 13.0–17.0)
MCH: 27.7 pg (ref 26.0–34.0)
MCHC: 33.1 g/dL (ref 30.0–36.0)
MCV: 83.8 fL (ref 80.0–100.0)
Platelets: 235 10*3/uL (ref 150–400)
RBC: 5.92 MIL/uL — ABNORMAL HIGH (ref 4.22–5.81)
RDW: 18 % — ABNORMAL HIGH (ref 11.5–15.5)
WBC: 9 10*3/uL (ref 4.0–10.5)
nRBC: 0 % (ref 0.0–0.2)

## 2023-09-27 LAB — COMPREHENSIVE METABOLIC PANEL WITH GFR
ALT: 29 U/L (ref 0–44)
AST: 25 U/L (ref 15–41)
Albumin: 4.4 g/dL (ref 3.5–5.0)
Alkaline Phosphatase: 111 U/L (ref 38–126)
Anion gap: 13 (ref 5–15)
BUN: 19 mg/dL (ref 8–23)
CO2: 24 mmol/L (ref 22–32)
Calcium: 9.7 mg/dL (ref 8.9–10.3)
Chloride: 101 mmol/L (ref 98–111)
Creatinine, Ser: 1.35 mg/dL — ABNORMAL HIGH (ref 0.61–1.24)
GFR, Estimated: 58 mL/min — ABNORMAL LOW (ref >60)
Glucose, Bld: 182 mg/dL — ABNORMAL HIGH (ref 70–99)
Potassium: 4.3 mmol/L (ref 3.5–5.1)
Sodium: 138 mmol/L (ref 135–145)
Total Bilirubin: 2.3 mg/dL — ABNORMAL HIGH (ref 0.0–1.2)
Total Protein: 8.9 g/dL — ABNORMAL HIGH (ref 6.5–8.1)

## 2023-09-27 LAB — C DIFFICILE QUICK SCREEN W PCR REFLEX
C Diff antigen: NEGATIVE
C Diff interpretation: NOT DETECTED
C Diff toxin: NEGATIVE

## 2023-09-27 LAB — LIPASE, BLOOD: Lipase: 40 U/L (ref 11–51)

## 2023-09-27 LAB — URINALYSIS, MICROSCOPIC (REFLEX)

## 2023-09-27 MED ORDER — FENTANYL CITRATE PF 50 MCG/ML IJ SOSY
50.0000 ug | PREFILLED_SYRINGE | Freq: Once | INTRAMUSCULAR | Status: AC
  Administered 2023-09-27: 50 ug via INTRAVENOUS
  Filled 2023-09-27: qty 1

## 2023-09-27 MED ORDER — ONDANSETRON HCL 4 MG PO TABS: 4.0000 mg | ORAL_TABLET | Freq: Four times a day (QID) | ORAL | 0 refills | Status: DC

## 2023-09-27 MED ORDER — VANCOMYCIN HCL 125 MG PO CAPS
125.0000 mg | ORAL_CAPSULE | Freq: Four times a day (QID) | ORAL | Status: DC
Start: 2023-09-27 — End: 2023-09-27

## 2023-09-27 MED ORDER — SODIUM CHLORIDE 0.9 % IV BOLUS
1000.0000 mL | Freq: Once | INTRAVENOUS | Status: AC
  Administered 2023-09-27: 1000 mL via INTRAVENOUS

## 2023-09-27 MED ORDER — IOHEXOL 300 MG/ML  SOLN
100.0000 mL | Freq: Once | INTRAMUSCULAR | Status: AC | PRN
  Administered 2023-09-27: 100 mL via INTRAVENOUS

## 2023-09-27 MED ORDER — VANCOMYCIN HCL 125 MG PO CAPS: 125.0000 mg | ORAL_CAPSULE | Freq: Four times a day (QID) | ORAL | 0 refills | Status: DC

## 2023-09-27 MED ORDER — ONDANSETRON HCL 4 MG/2ML IJ SOLN
4.0000 mg | Freq: Once | INTRAMUSCULAR | Status: AC
  Administered 2023-09-27: 4 mg via INTRAVENOUS
  Filled 2023-09-27: qty 2

## 2023-09-27 NOTE — ED Triage Notes (Signed)
 C/o increased diarrhea x 1 week. States "smells like cdiff" Hx of cdiff.  Denies fevers.

## 2023-09-27 NOTE — ED Provider Notes (Signed)
 [place] [facility] Provider Note   CSN: [id number] Arrival date & time: 09/27/23  1254     History  Chief Complaint  Patient presents with   Abdominal Pain    James [name] is a 68 y.o. male with medical history of CKD stage III, hypertension, iron deficiency anemia, chronic migraines, nonalcoholic steatohepatitis, liver cirrhosis secondary to [facility], diverticulitis, pancreatitis, C. difficile.  Patient presents to ED for evaluation of abdominal pain and diarrhea.  Reports that he has had abdominal pain, nausea, diarrhea for the last 8 days.  Reports history of C. difficile, states that this feels and smells very similar.  Denies any recent antibiotics.  Denies fevers at home, vomiting but is endorsing nausea, diarrhea and abdominal pain.  Denies dysuria or flank pain.  Denies alcohol use.  Reports that he recently had lipase which was elevated to 2000.    Abdominal Pain Associated symptoms: diarrhea and nausea   Associated symptoms: no dysuria, no fever and no vomiting        Home Medications Prior to Admission medications   Medication Sig Start Date End Date Taking? Authorizing Provider  acetaminophen (TYLENOL) 325 MG tablet Take 2 tablets (650 mg total) by mouth every 4 (four) hours as needed for headache or mild pain. 08/18/21   Leone Brand, NP  adalimumab (HUMIRA, 2 PEN,) 40 MG/0.8ML AJKT pen Inject 0.8 mLs (40 mg total) into the skin every 14 (fourteen) days. 08/30/23     amLODipine (NORVASC) 5 MG tablet Take 1 tablet (5 mg total) by mouth daily. 12/29/22   Georgeanna Lea, MD  aspirin 81 MG chewable tablet Chew 1 tablet (81 mg total) by mouth daily. 08/19/21   Leone Brand, NP  atorvastatin (LIPITOR) 80 MG tablet Take 1 tablet (80 mg total) by mouth at bedtime. 09/20/23   Wanda Plump, MD  botulinum toxin Type A (BOTOX) 200 units injection Provider to inject 155 units into the muscles of the head and neck every 3 months. Discard  remainder. Patient taking differently: Inject 200 Units into the muscle every 3 (three) months. Provider to inject 155 units into the muscles of the head and neck every 3 months. Discard remainder. 08/24/23   Sater, Pearletha Furl, MD  carvedilol (COREG) 12.5 MG tablet Take 1 tablet (12.5 mg total) by mouth 2 (two) times daily with a meal. 04/23/23   Paz, Nolon Rod, MD  clobetasol (TEMOVATE) 0.05 % external solution Apply 1 application externally twice a day 14 days. Patient taking differently: Apply 1 Application topically 2 (two) times daily as needed (rash). 10/01/22     clonazePAM (KLONOPIN) 0.5 MG tablet Take 1 tablet (0.5 mg total) by mouth at bedtime. 03/29/23 10/02/23  Sater, Pearletha Furl, MD  Continuous Glucose Sensor (DEXCOM G6 SENSOR) MISC Use to monitor blood sugar, change after 10 days Patient taking differently: 1 each by Other route See admin instructions. Use to monitor blood sugar, change after 10 days 07/16/23   Thapa, [place], MD  Continuous Glucose Transmitter (DEXCOM G6 TRANSMITTER) MISC Use as directed for 90 days 07/16/23   Thapa, [place], MD  dicyclomine (BENTYL) 20 MG tablet Take 1 tablet (20 mg total) by mouth 2 (two) times daily as needed. Patient taking differently: Take 20 mg by mouth 2 (two) times daily as needed for spasms. 02/12/23   Gowens, Mariah L, PA-C  DULoxetine (CYMBALTA) 60 MG capsule Take 2 capsules (120 mg total) by mouth daily. 09/14/23  Wanda Plump, MD  empagliflozin (JARDIANCE) 10 MG TABS tablet Take 1 tablet (10 mg total) by mouth daily with breakfast. 08/02/23   Thapa, [place], MD  EPINEPHrine (EPIPEN 2-PAK) 0.3 mg/0.3 mL IJ SOAJ injection Inject 0.3 mLs (0.3 mg total) into the muscle once as needed for up to 1 dose for anaphylaxis. Patient not taking: Reported on 09/13/2023 01/10/20   Wanda Plump, MD  gabapentin (NEURONTIN) 600 MG tablet Take 1 tablet (600 mg total) by mouth 2 (two) times daily. 09/22/23   Wanda Plump, MD  Insulin Disposable Pump (OMNIPOD 5 G6 PODS, GEN 5,) MISC Use  as directed every 3 (three) days. 09/09/22   Reather Littler, MD  insulin lispro (HUMALOG) 100 UNIT/ML injection Use up to 60 Units daily in pump Patient taking differently: Inject 60 Units into the skin See admin instructions. Use up to 60 Units daily in pump 10/07/22   Reather Littler, MD  Insulin Syringe-Needle U-100 (INSULIN SYRINGE 1CC/31GX5/16") 31G X 5/16" 1 ML MISC Use to administer Humalog 3 times a day Patient taking differently: 1 tablet by Other route See admin instructions. Use to administer Humalog 3 times a day 10/07/22   Reather Littler, MD  isosorbide mononitrate (IMDUR) 120 MG 24 hr tablet Take 1 tablet (120 mg total) by mouth daily. 08/24/23   Georgeanna Lea, MD  metFORMIN (GLUCOPHAGE) 500 MG tablet Take 1 tablet (500 mg total) by mouth 2 (two) times daily. 04/16/23   Thapa, [place], MD  nitroGLYCERIN (NITROSTAT) 0.4 MG SL tablet Place 1 tablet (0.4 mg total) under the tongue every 5 (five) minutes for 3 doses as needed for chest pain. 09/10/23   Georgeanna Lea, MD  pantoprazole (PROTONIX) 40 MG tablet Take 1 tablet (40 mg total) by mouth daily. 09/14/23   Wanda Plump, MD  traMADol (ULTRAM) 50 MG tablet Take 1 tablet (50 mg total) by mouth every 6 (six) hours as needed for up to 10 doses for severe pain. 10/18/22   Mardene Sayer, MD      Allergies    Bee venom, Hydrocodone bit-homatrop mbr, Toradol [ketorolac tromethamine], Prednisone, Sulfadiazine, Morphine and codeine, and Sulfa drugs cross reactors    Review of Systems   Review of Systems  Constitutional:  Negative for fever.  Gastrointestinal:  Positive for abdominal pain, diarrhea and nausea. Negative for vomiting.  Genitourinary:  Negative for dysuria and flank pain.    Physical Exam Updated Vital Signs BP 128/88   Pulse 82   Temp 97.7 F (36.5 C)   Resp 18   Ht 5\' 11"  (1.803 m)   Wt 99.8 kg   SpO2 93%   BMI 30.68 kg/m  Physical Exam Vitals and nursing note reviewed.  Constitutional:      General: He is not in  acute distress.    Appearance: He is well-developed.  HENT:     Head: Normocephalic and atraumatic.  Eyes:     Conjunctiva/sclera: Conjunctivae normal.  Cardiovascular:     Rate and Rhythm: Normal rate and regular rhythm.     Heart sounds: No murmur heard. Pulmonary:     Effort: Pulmonary effort is normal. No respiratory distress.     Breath sounds: Normal breath sounds.  Abdominal:     Palpations: Abdomen is soft.     Tenderness: There is abdominal tenderness.     Comments: Epigastric abdominal tenderness to palpation  Musculoskeletal:        General: No swelling.     Cervical  back: Neck supple.     Right lower leg: No edema.     Left lower leg: No edema.  Skin:    General: Skin is warm and dry.     Capillary Refill: Capillary refill takes less than 2 seconds.  Neurological:     Mental Status: He is alert and oriented to person, place, and time.  Psychiatric:        Mood and Affect: Mood normal.     ED Results / Procedures / Treatments   Labs (all labs ordered are listed, but only abnormal results are displayed) Labs Reviewed  COMPREHENSIVE METABOLIC PANEL WITH GFR - Abnormal; Notable for the following components:      Result Value   Glucose, Bld 182 (*)    Creatinine, Ser 1.35 (*)    Total Protein 8.9 (*)    Total Bilirubin 2.3 (*)    GFR, Estimated 58 (*)    All other components within normal limits  CBC - Abnormal; Notable for the following components:   RBC 5.92 (*)    RDW 18.0 (*)    All other components within normal limits  URINALYSIS, ROUTINE W REFLEX MICROSCOPIC - Abnormal; Notable for the following components:   Glucose, UA >=500 (*)    All other components within normal limits  URINALYSIS, MICROSCOPIC (REFLEX) - Abnormal; Notable for the following components:   Bacteria, UA RARE (*)    All other components within normal limits  C DIFFICILE QUICK SCREEN W PCR REFLEX    GASTROINTESTINAL PANEL BY PCR, STOOL (REPLACES STOOL CULTURE)  LIPASE, BLOOD     EKG None  Radiology CT ABDOMEN PELVIS W CONTRAST Result Date: 09/27/2023 CLINICAL DATA:  Acute abdominal pain. Pancreatitis. Cirrhosis. Nephrolithiasis. * Tracking Code: BO * EXAM: CT ABDOMEN AND PELVIS WITH CONTRAST TECHNIQUE: Multidetector CT imaging of the abdomen and pelvis was performed using the standard protocol following bolus administration of intravenous contrast. RADIATION DOSE REDUCTION: This exam was performed according to the departmental dose-optimization program which includes automated exposure control, adjustment of the mA and/or kV according to patient size and/or use of iterative reconstruction technique. CONTRAST:  OMNIPAQUE IOHEXOL 300 MG/ML  SOLN COMPARISON:  08/19/2023 FINDINGS: Lower Chest: No acute findings. Hepatobiliary: Cirrhosis again demonstrated. Recanalization of paraumbilical veins again seen, consistent with portal venous hypertension. No hepatic masses identified. Prior cholecystectomy. No evidence of biliary obstruction. Pancreas:  No mass or inflammatory changes. Spleen: Stable mild splenomegaly, consistent with portal venous hypertension. Adrenals/Urinary Tract: No suspicious masses identified. 2 mm right renal calculus again seen. No evidence of ureteral calculi or hydronephrosis. Stomach/Bowel: No evidence of obstruction, inflammatory process or abnormal fluid collections. Normal appendix visualized. Diverticulosis is seen mainly involving the sigmoid colon, however there is no evidence of diverticulitis. VP shunt tubing again seen with tip in left lower quadrant. No abnormal fluid collections seen. Vascular/Lymphatic: No pathologically enlarged lymph nodes. No acute vascular findings. Reproductive:  No mass or other significant abnormality. Other:  None. Musculoskeletal: No suspicious bone lesions identified. Lumbar spine fusion hardware and left hip prosthesis again seen. IMPRESSION: No acute findings. Cirrhosis with findings of portal venous hypertension.  No evidence of hepatic neoplasm. Sigmoid diverticulosis, without radiographic evidence of diverticulitis. Tiny right renal calculus. No evidence of ureteral calculi or hydronephrosis. Electronically Signed   By: Danae Orleans M.D.   On: 09/27/2023 16:51    Procedures Procedures    Medications Ordered in ED Medications  ondansetron (ZOFRAN) injection 4 mg (4 mg Intravenous Given 09/27/23 1550)  sodium chloride 0.9 % bolus 1,000 mL (0 mLs Intravenous Stopped 09/27/23 1735)  fentaNYL (SUBLIMAZE) injection 50 mcg (50 mcg Intravenous Given 09/27/23 1552)  iohexol (OMNIPAQUE) 300 MG/ML solution 100 mL (100 mLs Intravenous Contrast Given 09/27/23 1621)    ED Course/ Medical Decision Making/ A&P   { Medical Decision Making Amount and/or Complexity of Data Reviewed Labs: ordered. Radiology: ordered.  Risk Prescription drug management.   68 year old male presents for evaluation.  Please see HPI for further details.  On examination patient is afebrile and nontachycardic.  His lung sounds are clear bilaterally, he is not hypoxic.  He has tenderness in the epigastric region of his abdomen without rebound or guarding, no overlying skin change, no CVA tenderness bilaterally.  Neurological examinations at baseline.  Patient overall toxic in appearance with reassuring vital signs.  Differential diagnosis includes pancreatitis, C. difficile, viral GI bug.  Patient CBC with a leukocytosis, no anemia.  Metabolic panel with elevated creatinine 1.35 patient CBC without leukocytosis or anemia.  Metabolic panel reveals baseline creatinine 1.35, total bilirubin 2.3, no elevated LFTs, no electrolyte derangement.  Patient urinalysis shows glucose but no nitrites or leukocytes.  Lipase is at 40.  CT reveals no acute findings.  There is cirrhosis with findings of portal venous hypertension but the patient denies any hematemesis.  No evidence of hepatic neoplasm.  Patient workup is thus far unremarkable.  Patient  reports that his diarrhea has a very similar odor to it that reminds him of C. difficile so we will treat the patient with vancomycin 125 mg 4 times a day for 10 days.  Attempted to provide patient with initial dose of vancomycin here.  Pharmacy advises me that we do not have oral vancomycin here.  Will prescribe the patient oral vancomycin as an outpatient.  Patient reports he has GI follow-up appointment on 7 April.  Patient advised to continue with outpatient follow-up.  Patient will be sent home with Zofran and vancomycin.  Advised to return to the ED with any new or worsening symptoms.  Stable to discharge.  Final Clinical Impression(s) / ED Diagnoses Final diagnoses:  Diarrhea of presumed infectious origin  Epigastric pain    Rx / DC Orders ED Discharge Orders     None         Al Decant, PA-C 10/03/23 0551    Sloan Leiter, DO 10/05/23 [phone]

## 2023-09-27 NOTE — Discharge Instructions (Signed)
 It was a pleasure taking part in your care.  As we discussed, I am treating her for C. difficile.  Your test results have not resulted yet however it seems as if your clinical history coincides with past instances of C. difficile.  Please follow-up on the results of your testing done here today on MyChart.  If your C. difficile testing is negative, please discontinue use of vancomycin.  Please take Zofran every 6 hours as needed for nausea and vomiting.  Please follow-up with your GI specialist team on 7 April as previously planned.  Return to the ED with any new or worsening symptoms.

## 2023-09-28 ENCOUNTER — Ambulatory Visit: Payer: Medicare Other | Admitting: Neurology

## 2023-09-28 ENCOUNTER — Other Ambulatory Visit (HOSPITAL_COMMUNITY): Payer: Self-pay

## 2023-09-28 ENCOUNTER — Other Ambulatory Visit: Payer: Self-pay

## 2023-09-28 DIAGNOSIS — Z982 Presence of cerebrospinal fluid drainage device: Secondary | ICD-10-CM | POA: Diagnosis not present

## 2023-09-28 DIAGNOSIS — G4752 REM sleep behavior disorder: Secondary | ICD-10-CM | POA: Diagnosis not present

## 2023-09-28 DIAGNOSIS — G43709 Chronic migraine without aura, not intractable, without status migrainosus: Secondary | ICD-10-CM | POA: Diagnosis not present

## 2023-09-28 DIAGNOSIS — G4733 Obstructive sleep apnea (adult) (pediatric): Secondary | ICD-10-CM | POA: Diagnosis not present

## 2023-09-28 DIAGNOSIS — G911 Obstructive hydrocephalus: Secondary | ICD-10-CM | POA: Diagnosis not present

## 2023-09-28 LAB — GASTROINTESTINAL PANEL BY PCR, STOOL (REPLACES STOOL CULTURE)

## 2023-09-28 MED ORDER — CLONAZEPAM 0.5 MG PO TABS
0.5000 mg | ORAL_TABLET | Freq: Every day | ORAL | 5 refills | Status: DC
  Filled 2023-09-28 – 2023-10-04 (×2): qty 30, 30d supply, fill #0
  Filled 2023-10-09 – 2023-11-04 (×3): qty 30, 30d supply, fill #1

## 2023-09-28 MED ORDER — ONABOTULINUMTOXINA 200 UNITS IJ SOLR
155.0000 [IU] | Freq: Once | INTRAMUSCULAR | Status: AC
  Administered 2023-09-28: 155 [IU] via INTRAMUSCULAR

## 2023-09-28 NOTE — Progress Notes (Signed)
 [facility]  PATIENT: James [name] DOB: 08-Dec-1955  REFERRING DOCTOR OR PCP:  Willow Ora  SOURCE: patient, notes from Dr. Drue Novel, imaging results and MRI and CT scans on PACS personally reviewed  _________________________________   HISTORICAL  CHIEF COMPLAINT:  Chief Complaint  Patient presents with   Follow-up    Pt in room 10 alone. Here for botox injection for migraines.    HISTORY OF PRESENT ILLNESS:  James [name] is a 68 y.o. man with idiopathic intracranial hypertension and recent increase in the frequency and severity of headache  Update 09/28/2023:  He reports that headaches are doing very well after the last Botox injections until last week when they return and are now daily over the past few days.  Before Botox he was having daily headaches that were more severe.  Neck pain is also doing better.    Neck pain has done well the whole 3 months.  When a a migraine occurs it is bilateral most of the time and occipital and frontal located.   The headaches are usually bilaterally located, occiput and vertex.  When more severe he has nausea and photophobia.  Moving usually worsens the headaches.  Laying down sometimes improves them  He did not get a benefit from multiple prophylactic medications including Keppra, gabapentin, Emgality, Cymbalta.    He has obstructive sleep apnea but was unable to tolerate CPAP.  We had discussed the inspire device but he is not interested at this time..  We have discussed weight loss might be of benefit since unable to tolerate CPAP.     He reports the REM behavior disorder is doing well on nighttime clonazepam. He has had no active dreams since starting it .   No other signs of a synucleinopathy.   It also helps his insomnia  He has a VP shunt for idiopathic intracranial hypertension, since 2017.  Other medical issues:   In early 2022, he had 3 cardiac stents after being diagnosed with CAD after presenting with chest pain.    He had a small MI.      He is also on Humira for psoriatis.  He has MASH related cirrhosis.   Chronic Migraine History: He was having daily HA with migrainous features.   He did not get a benefit from multiple prophylactic medications including Keppra, gabapentin, Emgality, Cymbalta.   Botox therapy started July 2021.  Spine: L-spine MRI 08/15/18 showed progressive epidural lipomatosis in the lower lumbar spine with progressive compression of the thecal sac and spinal stenosis at L3-4, L4-5, and L5-S1 compared to the prior study. Progressive facet degeneration and anterolisthesis at L4-5 since 2011.    MRI of the cervical spine performed 04/27/2017 showed  thoracic fusion hardware from C7 and below. There is mild spondylosis and disc bulging at C3-C4 through C6-C7. There does not appear to be any significant foraminal narrowing and there is no spinal stenosis.   Idiopthic intracranial Hypertension: He was diagnosed with idiopathic intracranial hypertension many years ago and had a VP shunt placed in 2007. A revision was performed July 2007 due to infection. He has a programmable Medtronic valve. Due to the increased headaches, it was reprogrammed from 1.5-1.0. Tapping of the shunt showed a pressure 160 (was drained to 140 mm). There was no infection. He also had an MRI of the brain and MR venogram. CT had shown the left-sided ventricular catheter extends into the right caudate head, similar to the previous study the MR venogram (11/12/2016) was  reportedly normal. The left transverse and sigmoid sinuses are not well evaluated due to the shunt reservoir (but also likely he is right dominant).   CT head 3/13 2020 personally reviewed and no evidence of shunt failure        REVIEW OF SYSTEMS: Constitutional: No fevers, chills, sweats, or change in appetite Eyes: No visual changes, double vision, eye pain Ear, nose and throat: No hearing loss, ear pain, nasal congestion, sore throat Cardiovascular: No  chest pain, palpitations Respiratory:  No shortness of breath at rest or with exertion.   No wheezes GastrointestinaI: No nausea, vomiting, diarrhea, abdominal pain, fecal incontinence Genitourinary:  No dysuria, urinary retention or frequency.  No nocturia. Musculoskeletal:  as above Integumentary: psoriasis on Humira Neurological: as above Psychiatric: No depression at this time.  No anxiety Endocrine: No palpitations, diaphoresis, change in appetite, change in weigh or increased thirst Hematologic/Lymphatic:  No anemia, purpura, petechiae. Allergic/Immunologic: No itchy/runny eyes, nasal congestion, recent allergic reactions, rashes  ALLERGIES: Allergies  Allergen Reactions   Bee Venom Anaphylaxis   Hydrocodone Bit-Homatrop Mbr Other (See Comments)    Hallucinations, confusion, delirium Depressed feeling   Toradol [Ketorolac Tromethamine] Other (See Comments)    Hallucinations, confusion, delirium   Prednisone     Patient reports it causes cirrhosis to flare up   Sulfadiazine     NDC ZOXW:[id number] NDC BJYN:[id number] NDC VHQI:[id number]   Morphine And Codeine Other (See Comments)    Hallucinations, back in the 80s. States has taken vicodin before w/o problems    Sulfa Drugs Cross Reactors Rash    HOME MEDICATIONS:  Current Outpatient Medications:    acetaminophen (TYLENOL) 325 MG tablet, Take 2 tablets (650 mg total) by mouth every 4 (four) hours as needed for headache or mild pain., Disp: , Rfl:    adalimumab (HUMIRA, 2 PEN,) 40 MG/0.8ML AJKT pen, Inject 0.8 mLs (40 mg total) into the skin every 14 (fourteen) days., Disp: 2 each, Rfl: 5   amLODipine (NORVASC) 5 MG tablet, Take 1 tablet (5 mg total) by mouth daily., Disp: 90 tablet, Rfl: 3   aspirin 81 MG chewable tablet, Chew 1 tablet (81 mg total) by mouth daily., Disp: , Rfl:    atorvastatin (LIPITOR) 80 MG tablet, Take 1 tablet (80 mg total) by mouth at bedtime., Disp: 90 tablet, Rfl: 1   botulinum toxin Type A  (BOTOX) 200 units injection, Provider to inject 155 units into the muscles of the head and neck every 3 months. Discard remainder. (Patient taking differently: Inject 200 Units into the muscle every 3 (three) months. Provider to inject 155 units into the muscles of the head and neck every 3 months. Discard remainder.), Disp: 1 each, Rfl: 2   carvedilol (COREG) 12.5 MG tablet, Take 1 tablet (12.5 mg total) by mouth 2 (two) times daily with a meal., Disp: 180 tablet, Rfl: 1   clobetasol (TEMOVATE) 0.05 % external solution, Apply 1 application externally twice a day 14 days. (Patient taking differently: Apply 1 Application topically 2 (two) times daily as needed (rash).), Disp: 50 mL, Rfl: 1   Continuous Glucose Sensor (DEXCOM G6 SENSOR) MISC, Use to monitor blood sugar, change after 10 days (Patient taking differently: 1 each by Other route See admin instructions. Use to monitor blood sugar, change after 10 days), Disp: 9 each, Rfl: 3   Continuous Glucose Transmitter (DEXCOM G6 TRANSMITTER) MISC, Use as directed for 90 days, Disp: 1 each, Rfl: 3   dicyclomine (BENTYL) 20 MG tablet, Take 1 tablet (  20 mg total) by mouth 2 (two) times daily as needed. (Patient taking differently: Take 20 mg by mouth 2 (two) times daily as needed for spasms.), Disp: 10 tablet, Rfl: 0   DULoxetine (CYMBALTA) 60 MG capsule, Take 2 capsules (120 mg total) by mouth daily., Disp: 180 capsule, Rfl: 1   empagliflozin (JARDIANCE) 10 MG TABS tablet, Take 1 tablet (10 mg total) by mouth daily with breakfast., Disp: 90 tablet, Rfl: 3   EPINEPHrine (EPIPEN 2-PAK) 0.3 mg/0.3 mL IJ SOAJ injection, Inject 0.3 mLs (0.3 mg total) into the muscle once as needed for up to 1 dose for anaphylaxis., Disp: 2 each, Rfl: 2   gabapentin (NEURONTIN) 600 MG tablet, Take 1 tablet (600 mg total) by mouth 2 (two) times daily., Disp: 180 tablet, Rfl: 1   Insulin Disposable Pump (OMNIPOD 5 G6 PODS, GEN 5,) MISC, Use as directed every 3 (three) days., Disp: 10  each, Rfl: 3   insulin lispro (HUMALOG) 100 UNIT/ML injection, Use up to 60 Units daily in pump (Patient taking differently: Inject 60 Units into the skin See admin instructions. Use up to 60 Units daily in pump), Disp: 20 mL, Rfl: 2   Insulin Syringe-Needle U-100 (INSULIN SYRINGE 1CC/31GX5/16") 31G X 5/16" 1 ML MISC, Use to administer Humalog 3 times a day (Patient taking differently: 1 tablet by Other route See admin instructions. Use to administer Humalog 3 times a day), Disp: 100 each, Rfl: 0   isosorbide mononitrate (IMDUR) 120 MG 24 hr tablet, Take 1 tablet (120 mg total) by mouth daily., Disp: 90 tablet, Rfl: 2   metFORMIN (GLUCOPHAGE) 500 MG tablet, Take 1 tablet (500 mg total) by mouth 2 (two) times daily., Disp: 180 tablet, Rfl: 3   nitroGLYCERIN (NITROSTAT) 0.4 MG SL tablet, Place 1 tablet (0.4 mg total) under the tongue every 5 (five) minutes for 3 doses as needed for chest pain., Disp: 25 tablet, Rfl: 6   ondansetron (ZOFRAN) 4 MG tablet, Take 1 tablet (4 mg total) by mouth every 6 (six) hours., Disp: 12 tablet, Rfl: 0   pantoprazole (PROTONIX) 40 MG tablet, Take 1 tablet (40 mg total) by mouth daily., Disp: 90 tablet, Rfl: 1   traMADol (ULTRAM) 50 MG tablet, Take 1 tablet (50 mg total) by mouth every 6 (six) hours as needed for up to 10 doses for severe pain., Disp: 10 tablet, Rfl: 0   vancomycin (VANCOCIN) 125 MG capsule, Take 1 capsule (125 mg total) by mouth 4 (four) times daily., Disp: 40 capsule, Rfl: 0   clonazePAM (KLONOPIN) 0.5 MG tablet, Take 1 tablet (0.5 mg total) by mouth at bedtime., Disp: 30 tablet, Rfl: 5  Current Facility-Administered Medications:    botulinum toxin Type A (BOTOX) injection 155 Units, 155 Units, Intramuscular, Once, Andrea Ferrer, Pearletha Furl, MD  PAST MEDICAL HISTORY: Past Medical History:  Diagnosis Date   Abnormal cardiac CT angiography    Acid reflux    Annual physical exam 04/08/2015   Arthritis    Atypical chest pain 06/13/2020   Blood transfusion  without reported diagnosis    Body mass index (BMI) 35.0-35.9, adult 04/05/2019   Chronic fatigue 01/28/2015   Chronic headaches    on cymbalta   Chronic kidney disease, stage 3b (HCC) 07/14/2023   Chronic migraine w/o aura, not intractable, w/o stat migr 10/24/2018   Cirrhosis (HCC)    Colon polyps    Complication of anesthesia    problems waking up from anesthesia   Coronary artery disease 01/23/2019  Depression    on cymbalta   Diabetes mellitus with neuropathy (HCC)    Diabetes with neuropathy 04/25/2013   Diverticulitis 03/2013   Dyslipidemia 05/29/2019   Eczema    Elevated LFTs    Epidural lipomatosis 10/05/2018   Essential (primary) hypertension 04/05/2019   Essential hypertension 10/10/2019   Fatty liver    GERD (gastroesophageal reflux disease) 04/28/2011   H/O craniotomy 05/07/2015   Headache 04/28/2011   Hepatitis 10/2017   NASH cirrhosis   History of kidney stones    Hyperlipidemia    Hypersomnia with sleep apnea 01/28/2015   Hypertension    IDA (iron deficiency anemia) 01/24/2019   Idiopathic intracranial hypertension 01/14/2017   Insomnia 04/26/2013   Kidney stone    Liver cirrhosis secondary to NASH (nonalcoholic steatohepatitis) (HCC) 01/02/2016   Low back pain 04/05/2019   Lower back injury 08/14/2019   Morbid obesity (HCC)    Neuromuscular disorder (HCC)    neuropathy   Neuropathy    Nonalcoholic steatohepatitis 10/05/2018   Obstructive hydrocephalus (HCC) 01/28/2015   OSA -- dx ~ 2012, cpap intolerant 09/04/2014    dx ~ 2012, cpap intolerant    PCP NOTES >>> 04/08/2015   Post-op pain 03/19/2019   Post-traumatic hydrocephalus (HCC)    s/p shunts x 2 (first got infected )   Presence of cerebrospinal fluid drainage device 07/28/2011   Psoriasis    sees Dr Lenis Noon   Psoriatic arthritis (HCC)    REM behavioral disorder 01/14/2017   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   see's Dr.Ortmann   Severe obesity (BMI >= 40) (HCC)  01/28/2015   SI (sacroiliac) joint dysfunction 08/14/2019   Sigmoid diverticulitis 04/25/2013   Sleep apnea    no CPAP      Spondylolisthesis, lumbar region 03/16/2019   Stomach ulcer    Testosterone deficiency 04/28/2011   VP (ventriculoperitoneal) shunt status 07/31/2020    PAST SURGICAL HISTORY: Past Surgical History:  Procedure Laterality Date   AMPUTATION Left 12/27/2021   Procedure: AMPUTATION GREAT TOE;  Surgeon: Vivi Barrack, DPM;  Location: [facility] OR;  Service: Podiatry;  Laterality: Left;   BACK SURGERY  1980   BRAIN SURGERY     VP shunts placed in 2007   CHOLECYSTECTOMY N/A 08/25/2017   Procedure: LAPAROSCOPIC CHOLECYSTECTOMY WITH INTRAOPERATIVE CHOLANGIOGRAM;  Surgeon: Griselda Miner, MD;  Location[facility] OR;  Service: General;  Laterality: N/A;   COLONOSCOPY     CORONARY STENT INTERVENTION N/A 09/27/2020   Procedure: CORONARY STENT INTERVENTION;  Surgeon: Corky Crafts, MD;  Location: [facility] INVASIVE CV LAB;  Service: Cardiovascular;  Laterality: N/A;   CORONARY ULTRASOUND/IVUS N/A 09/27/2020   Procedure: Intravascular Ultrasound/IVUS;  Surgeon: Corky Crafts, MD;  Location: [facility] INVASIVE CV LAB;  Service: Cardiovascular;  Laterality: N/A;   JOINT REPLACEMENT     total hip   LEFT HEART CATH N/A 09/27/2020   Procedure: Left Heart Cath;  Surgeon: Corky Crafts, MD;  Location: [facility] INVASIVE CV LAB;  Service: Cardiovascular;  Laterality: N/A;   LEFT HEART CATH AND CORONARY ANGIOGRAPHY N/A 09/24/2020   Procedure: LEFT HEART CATH AND CORONARY ANGIOGRAPHY;  Surgeon: Corky Crafts, MD;  Location: [facility] INVASIVE CV LAB;  Service: Cardiovascular;  Laterality: N/A;   LEFT HEART CATH AND CORONARY ANGIOGRAPHY N/A 08/18/2021   Procedure: LEFT HEART CATH AND CORONARY ANGIOGRAPHY;  Surgeon: Lennette Bihari, MD;  Location: [facility] INVASIVE CV LAB;  Service: Cardiovascular;  Laterality: N/A;   LUMBAR FUSION  03/16/2019  SHOULDER SURGERY Left 2010   TEE WITHOUT CARDIOVERSION N/A  01/01/2022   Procedure: TRANSESOPHAGEAL ECHOCARDIOGRAM (TEE);  Surgeon: Thomasene Ripple, DO;  Location: [facility] ENDOSCOPY;  Service: Cardiovascular;  Laterality: N/A;   TOE SURGERY Left 2018   TONSILLECTOMY     as a child   TOTAL HIP ARTHROPLASTY Left 2011   UPPER GASTROINTESTINAL ENDOSCOPY  01/04/2020   VENTRICULOPERITONEAL SHUNT  2007   x2    FAMILY HISTORY: Family History  Problem Relation Age of Onset   Other Mother    Lung cancer Father        alive, former smoker    Heart disease Brother        MI age 65   Other Brother        Murdered   Down syndrome Son    Diabetes Neg Hx    Prostate cancer Neg Hx    Colon cancer Neg Hx    Stomach cancer Neg Hx    Pancreatic cancer Neg Hx    Liver disease Neg Hx     SOCIAL HISTORY:  Social History   Socioeconomic History   Marital status: Married    Spouse name: Burna Mortimer   Number of children: 2   Years of education: Not on file   Highest education level: 12th grade  Occupational History   Occupation: disabled   Tobacco Use   Smoking status: Never   Smokeless tobacco: Never  Vaping Use   Vaping status: Never Used  Substance and Sexual Activity   Alcohol use: No   Drug use: No   Sexual activity: Yes    Partners: Female  Other Topics Concern   Not on file  Social History Narrative   Household-- pt , wife, one adult son with Down's syndrome   younger son lives in [place]   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   Social Drivers of Corporate investment banker Strain: Low Risk  (02/10/2023)   Overall Financial Resource Strain (CARDIA)    Difficulty of Paying Living Expenses: Not hard at all  Food Insecurity: No Food Insecurity (08/19/2023)   Hunger Vital Sign    Worried About Running Out of Food in the Last Year: Never true    Ran Out of Food in the Last Year: Never true  Transportation Needs: No Transportation Needs (08/19/2023)   PRAPARE - Administrator, Civil Service  (Medical): No    Lack of Transportation (Non-Medical): No  Physical Activity: Unknown (02/10/2023)   Exercise Vital Sign    Days of Exercise per Week: 0 days    Minutes of Exercise per Session: Not on file  Stress: No Stress Concern Present (02/10/2023)   [employer] of Occupational Health - Occupational Stress Questionnaire    Feeling of Stress : Only a little  Social Connections: Moderately Isolated (08/20/2023)   Social Connection and Isolation Panel [NHANES]    Frequency of Communication with Friends and Family: Once a week    Frequency of Social Gatherings with Friends and Family: More than three times a week    Attends Religious Services: Never    Database administrator or Organizations: No    Attends Banker Meetings: Never    Marital Status: Married  Catering manager Violence: Not At Risk (08/19/2023)   Humiliation, Afraid, Rape, and Kick questionnaire    Fear of Current or Ex-Partner: No    Emotionally Abused: No    Physically Abused: No  Sexually Abused: No     PHYSICAL EXAM There were no vitals filed for this visit.   There is no height or weight on file to calculate BMI.     General: The patient is well-developed and well-nourished and in no acute distress, he has a VP shunt bulb on the left.      Neck: He has mild tenderness at the occiput, left > right  Good range of motion..  No weakness.   Skin: Extremities are without significant edema.  Psoriasis is well controlled.  Neurologic Exam  Mental status: The patient is alert and oriented x 3 at the time of the examination.  No ptosis.  The patient has apparent normal recent and remote memory, with an apparently normal attention span and concentration ability.   Speech is normal.  Cranial nerves: Extraocular movements are full.  Facial strength and sensation is normal.  Neck strength is normal.  Hearing is normal.  Motor: 5/5 strength  Coordination: Cerebellar testing shows good  finger-nose-finger  Gait and station: Station is normal.  The gait is arthritic.  Tandem gait is wide.  Romberg is negative.     DIAGNOSTIC DATA (LABS, IMAGING, TESTING) - I reviewed patient records, labs, notes, testing and imaging myself where available.  Lab Results  Component Value Date   WBC 9.0 09/27/2023   HGB 16.4 09/27/2023   HCT 49.6 09/27/2023   MCV 83.8 09/27/2023   PLT 235 09/27/2023      Component Value Date/Time   NA 138 09/27/2023 1302   NA 143 07/06/2022 0000   K 4.3 09/27/2023 1302   CL 101 09/27/2023 1302   CO2 24 09/27/2023 1302   GLUCOSE 182 (H) 09/27/2023 1302   BUN 19 09/27/2023 1302   BUN 21 07/06/2022 0000   CREATININE 1.35 (H) 09/27/2023 1302   CALCIUM 9.7 09/27/2023 1302   PROT 8.9 (H) 09/27/2023 1302   ALBUMIN 4.4 09/27/2023 1302   AST 25 09/27/2023 1302   ALT 29 09/27/2023 1302   ALKPHOS 111 09/27/2023 1302   BILITOT 2.3 (H) 09/27/2023 1302   GFRNONAA 58 (L) 09/27/2023 1302   GFRAA 66 08/08/2020 1442   Lab Results  Component Value Date   CHOL 132 01/20/2023   HDL 41.00 01/20/2023   LDLCALC 68 01/30/2022   LDLDIRECT 59.0 01/20/2023   TRIG 145 08/20/2023   CHOLHDL 3 01/20/2023   Lab Results  Component Value Date   HGBA1C 7.9 (A) 07/16/2023   Lab Results  Component Value Date   VITAMINB12 490 06/10/2018   Lab Results  Component Value Date   TSH 1.693 08/19/2023       ASSESSMENT AND PLAN  Chronic migraine w/o aura, not intractable, w/o stat migr - Plan: botulinum toxin Type A (BOTOX) injection 155 Units  REM behavioral disorder  Obstructive hydrocephalus (HCC)  Presence of cerebrospinal fluid drainage device  OSA -- dx ~ 2012, cpap intolerant  1.  Botox 155 units as follows: Frontalis muscle (5 units x 4), corrugators and procerus (5 units x 3), temporalis (5 units x 8), occipitalis (5 units x 4), splenius capitis (10 units x 2), splenius cervicis (10 units x 2), C6-C7 paraspinal (5 units x 2), trapezius (10 units x  2).  45 units wasted.   2.    Continue clonazepam nightly for REM behavior disorder and insomnia. 3.  He was unable to tolerate CPAP and does not wish to retry.   4.   rtc 6 months, sooner if new or  worsening issues.  Essam Lowdermilk A. Epimenio Foot, MD, [facility] 09/28/2023, 7:47 PM Certified in Neurology, Clinical Neurophysiology, Sleep Medicine, Pain Medicine and Neuroimaging  [facility] [address], [address] [place], [place] [address] [phone]

## 2023-09-28 NOTE — Progress Notes (Signed)
 Botox- 200 units x 1 vial Lot: [id number] Expiration: 09/2025 NDC: 1610-9604-54  Bacteriostatic 0.9% Sodium Chloride- * mL  UJW:[id number] Expiration: 04/29/2024 NDC: 8295-6213-08  Dx:G43.709  B/B Witnessed by: DrSater prepared botox and saline

## 2023-09-29 ENCOUNTER — Other Ambulatory Visit (HOSPITAL_BASED_OUTPATIENT_CLINIC_OR_DEPARTMENT_OTHER): Payer: Self-pay

## 2023-09-29 DIAGNOSIS — D485 Neoplasm of uncertain behavior of skin: Secondary | ICD-10-CM | POA: Diagnosis not present

## 2023-09-29 DIAGNOSIS — L570 Actinic keratosis: Secondary | ICD-10-CM | POA: Diagnosis not present

## 2023-09-29 DIAGNOSIS — N1832 Chronic kidney disease, stage 3b: Secondary | ICD-10-CM | POA: Diagnosis not present

## 2023-09-29 DIAGNOSIS — Z79899 Other long term (current) drug therapy: Secondary | ICD-10-CM | POA: Diagnosis not present

## 2023-09-29 DIAGNOSIS — L409 Psoriasis, unspecified: Secondary | ICD-10-CM | POA: Diagnosis not present

## 2023-09-29 DIAGNOSIS — K7469 Other cirrhosis of liver: Secondary | ICD-10-CM | POA: Diagnosis not present

## 2023-09-29 MED ORDER — CLOBETASOL PROPIONATE 0.05 % EX CREA
1.0000 | TOPICAL_CREAM | Freq: Two times a day (BID) | CUTANEOUS | 1 refills | Status: AC
  Filled 2023-09-29: qty 60, 14d supply, fill #0
  Filled 2023-10-09 – 2023-10-11 (×2): qty 60, 14d supply, fill #1

## 2023-09-30 ENCOUNTER — Other Ambulatory Visit (HOSPITAL_COMMUNITY): Payer: Self-pay

## 2023-10-01 ENCOUNTER — Other Ambulatory Visit (HOSPITAL_COMMUNITY): Payer: Self-pay

## 2023-10-03 NOTE — Progress Notes (Unsigned)
 10/03/2023 James [name] [id number] [birth year]   Chief Complaint:  History of Present Illness: James [name] is a 68 year old male with a past medical history of depression, hypertension, hyperlipidemia, coronary artery disease s/p stent placement to the LAD 2022 on Plavix, DM type [name], PAD s/p left great toe amputation, kidney stones, obesity, sleep apnea, GERD, GAVE, IDA, colon polyps, diverticulitis, MASH cirrhosis and hydrocephalous s/p VP shunt placement x 2. Past diverticulitis. He is known by Dr. Marina Goodell.   Atypical CP, seen by cardiologist Dr .Kirtland Bouchard     Latest Ref Rng & Units 09/27/2023    1:02 PM 09/08/2023   12:45 PM 08/30/2023   11:40 AM  CBC  WBC 4.0 - 10.5 K/uL 9.0  6.5  6.9   Hemoglobin 13.0 - 17.0 g/dL 78.2  95.6  21.3   Hematocrit 39.0 - 52.0 % 49.6  45.5  43.5   Platelets 150 - 400 K/uL 235  167  192.0        Latest Ref Rng & Units 09/27/2023    1:02 PM 09/08/2023   12:45 PM 08/30/2023   11:40 AM  CMP  Glucose 70 - 99 mg/dL 086  578  469   BUN 8 - 23 mg/dL 19  18  18    Creatinine 0.61 - 1.24 mg/dL 6.29  5.28  4.13   Sodium 135 - 145 mmol/L 138  136  138   Potassium 3.5 - 5.1 mmol/L 4.3  4.1  4.6   Chloride 98 - 111 mmol/L 101  105  101   CO2 22 - 32 mmol/L 24  21  26    Calcium 8.9 - 10.3 mg/dL 9.7  8.9  9.6   Total Protein 6.5 - 8.1 g/dL 8.9  8.4  7.6   Total Bilirubin 0.0 - 1.2 mg/dL 2.3  1.8  1.0   Alkaline Phos 38 - 126 U/L 111  126  149   AST 15 - 41 U/L 25  21  17    ALT 0 - 44 U/L 29  30  28      EGD 01/19/2023: 1. Trace esophageal varix 2. Mild portal hypertensive gastropathy 3. Otherwise unremarkable EGD.  Surveillance EGD in 2 years.  Colonoscopy 01/19/2023: - Two 3 to 7 mm polyps in the transverse colon and in the ascending colon, removed with a cold snare. Resected and retrieved.  - One 1 mm polyp in the transverse colon, removed with a jumbo cold forceps. Resected and retrieved.  - Diverticulosis in the entire examined colon.  - Internal  hemorrhoids. - 5 Year recall colonoscopy   - The examination was otherwise normal on direct and retroflexion views.  1. Surgical [P], colon, ascending, polyp (1) TUBULAR ADENOMA (1) WITHOUT HIGH GRADE DYSPLASIA. 2. Surgical [P], colon, transverse, polyp (1) TUBULAR ADENOMA (1) WITHOUT HIGH GRADE DYSPLASIA. 3. Surgical [P], colon, transverse, polyp (1) TUBULAR ADENOMA (MULTIPLE FRAGMENTS) WITHOUT HIGH GRADE DYSPLASIA. James Picket MD   Past Medical History:  Diagnosis Date   Abnormal cardiac CT angiography    Acid reflux    Annual physical exam 04/08/2015   Arthritis    Atypical chest pain 06/13/2020   Blood transfusion without reported diagnosis    Body mass index (BMI) 35.0-35.9, adult 04/05/2019   Chronic fatigue 01/28/2015   Chronic headaches    on cymbalta   Chronic kidney disease, stage 3b (HCC) 07/14/2023   Chronic migraine w/o aura, not intractable, w/o stat migr 10/24/2018   Cirrhosis (HCC)  Colon polyps    Complication of anesthesia    problems waking up from anesthesia   Coronary artery disease 01/23/2019   Depression    on cymbalta   Diabetes mellitus with neuropathy (HCC)    Diabetes with neuropathy 04/25/2013   Diverticulitis 03/2013   Dyslipidemia 05/29/2019   Eczema    Elevated LFTs    Epidural lipomatosis 10/05/2018   Essential (primary) hypertension 04/05/2019   Essential hypertension 10/10/2019   Fatty liver    GERD (gastroesophageal reflux disease) 04/28/2011   H/O craniotomy 05/07/2015   Headache 04/28/2011   Hepatitis 10/2017   NASH cirrhosis   History of kidney stones    Hyperlipidemia    Hypersomnia with sleep apnea 01/28/2015   Hypertension    IDA (iron deficiency anemia) 01/24/2019   Idiopathic intracranial hypertension 01/14/2017   Insomnia 04/26/2013   Kidney stone    Liver cirrhosis secondary to NASH (nonalcoholic steatohepatitis) (HCC) 01/02/2016   Low back pain 04/05/2019   Lower back injury 08/14/2019   Morbid obesity (HCC)     Neuromuscular disorder (HCC)    neuropathy   Neuropathy    Nonalcoholic steatohepatitis 10/05/2018   Obstructive hydrocephalus (HCC) 01/28/2015   OSA -- dx ~ 2012, cpap intolerant 09/04/2014    dx ~ 2012, cpap intolerant    PCP NOTES >>> 04/08/2015   Post-op pain 03/19/2019   Post-traumatic hydrocephalus (HCC)    s/p shunts x 2 (first got infected )   Presence of cerebrospinal fluid drainage device 07/28/2011   Psoriasis    sees Dr Lenis Noon   Psoriatic arthritis (HCC)    REM behavioral disorder 01/14/2017   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   see's Dr.Ortmann   Severe obesity (BMI >= 40) (HCC) 01/28/2015   SI (sacroiliac) joint dysfunction 08/14/2019   Sigmoid diverticulitis 04/25/2013   Sleep apnea    no CPAP      Spondylolisthesis, lumbar region 03/16/2019   Stomach ulcer    Testosterone deficiency 04/28/2011   VP (ventriculoperitoneal) shunt status 07/31/2020   Past Surgical History:  Procedure Laterality Date   AMPUTATION Left 12/27/2021   Procedure: AMPUTATION GREAT TOE;  Surgeon: Vivi Barrack, DPM;  Location: [facility] OR;  Service: Podiatry;  Laterality: Left;   BACK SURGERY  1980   BRAIN SURGERY     VP shunts placed in 2007   CHOLECYSTECTOMY N/A 08/25/2017   Procedure: LAPAROSCOPIC CHOLECYSTECTOMY WITH INTRAOPERATIVE CHOLANGIOGRAM;  Surgeon: Griselda Miner, MD;  Location: [facility] OR;  Service: General;  Laterality: N/A;   COLONOSCOPY     CORONARY STENT INTERVENTION N/A 09/27/2020   Procedure: CORONARY STENT INTERVENTION;  Surgeon: Corky Crafts, MD;  Location: [facility] INVASIVE CV LAB;  Service: Cardiovascular;  Laterality: N/A;   CORONARY ULTRASOUND/IVUS N/A 09/27/2020   Procedure: Intravascular Ultrasound/IVUS;  Surgeon: Corky Crafts, MD;  Location: [facility] INVASIVE CV LAB;  Service: Cardiovascular;  Laterality: N/A;   JOINT REPLACEMENT     total hip   LEFT HEART CATH N/A 09/27/2020   Procedure: Left Heart Cath;  Surgeon: Corky Crafts, MD;   Location: [facility] INVASIVE CV LAB;  Service: Cardiovascular;  Laterality: N/A;   LEFT HEART CATH AND CORONARY ANGIOGRAPHY N/A 09/24/2020   Procedure: LEFT HEART CATH AND CORONARY ANGIOGRAPHY;  Surgeon: Corky Crafts, MD;  Location: [facility] INVASIVE CV LAB;  Service: Cardiovascular;  Laterality: N/A;   LEFT HEART CATH AND CORONARY ANGIOGRAPHY N/A 08/18/2021   Procedure: LEFT HEART CATH AND CORONARY ANGIOGRAPHY;  Surgeon: Nicki Guadalajara  A, MD;  Location: [facility] INVASIVE CV LAB;  Service: Cardiovascular;  Laterality: N/A;   LUMBAR FUSION  03/16/2019   SHOULDER SURGERY Left 2010   TEE WITHOUT CARDIOVERSION N/A 01/01/2022   Procedure: TRANSESOPHAGEAL ECHOCARDIOGRAM (TEE);  Surgeon: Thomasene Ripple, DO;  Location: [facility] ENDOSCOPY;  Service: Cardiovascular;  Laterality: N/A;   TOE SURGERY Left 2018   TONSILLECTOMY     as a child   TOTAL HIP ARTHROPLASTY Left 2011   UPPER GASTROINTESTINAL ENDOSCOPY  01/04/2020   VENTRICULOPERITONEAL SHUNT  2007   x2       Current Medications, Allergies, Past Medical History, Past Surgical History, Family History and Social History were reviewed in [employer] electronic medical record.   Review of Systems:   Constitutional: Negative for fever, sweats, chills or weight loss.  Respiratory: Negative for shortness of breath.   Cardiovascular: Negative for chest pain, palpitations and leg swelling.  Gastrointestinal: See HPI.  Musculoskeletal: Negative for back pain or muscle aches.  Neurological: Negative for dizziness, headaches or paresthesias.    Physical Exam: There were no vitals taken for this visit. General: in no acute distress. Head: Normocephalic and atraumatic. Eyes: No scleral icterus. Conjunctiva pink . Ears: Normal auditory acuity. Mouth: Dentition intact. No ulcers or lesions.  Lungs: Clear throughout to auscultation. Heart: Regular rate and rhythm, no murmur. Abdomen: Soft, nontender and nondistended. No masses or hepatomegaly. Normal bowel sounds x 4  quadrants.  Rectal: Deferred. Musculoskeletal: Symmetrical with no gross deformities. Extremities: No edema. Neurological: Alert oriented x 4. No focal deficits.  Psychological: Alert and cooperative. Normal mood and affect  Assessment and Recommendations: ***

## 2023-10-04 ENCOUNTER — Telehealth: Payer: Self-pay | Admitting: Pharmacy Technician

## 2023-10-04 ENCOUNTER — Other Ambulatory Visit (HOSPITAL_COMMUNITY): Payer: Self-pay

## 2023-10-04 ENCOUNTER — Ambulatory Visit: Payer: PPO | Admitting: Nurse Practitioner

## 2023-10-04 ENCOUNTER — Encounter: Payer: Self-pay | Admitting: Nurse Practitioner

## 2023-10-04 ENCOUNTER — Other Ambulatory Visit (INDEPENDENT_AMBULATORY_CARE_PROVIDER_SITE_OTHER)

## 2023-10-04 VITALS — BP 104/70 | HR 76 | Ht 69.5 in | Wt 234.1 lb

## 2023-10-04 DIAGNOSIS — K7581 Nonalcoholic steatohepatitis (NASH): Secondary | ICD-10-CM

## 2023-10-04 DIAGNOSIS — K7460 Unspecified cirrhosis of liver: Secondary | ICD-10-CM

## 2023-10-04 DIAGNOSIS — K859 Acute pancreatitis without necrosis or infection, unspecified: Secondary | ICD-10-CM | POA: Diagnosis not present

## 2023-10-04 DIAGNOSIS — Z860101 Personal history of adenomatous and serrated colon polyps: Secondary | ICD-10-CM | POA: Diagnosis not present

## 2023-10-04 DIAGNOSIS — Z9049 Acquired absence of other specified parts of digestive tract: Secondary | ICD-10-CM | POA: Diagnosis not present

## 2023-10-04 DIAGNOSIS — K219 Gastro-esophageal reflux disease without esophagitis: Secondary | ICD-10-CM

## 2023-10-04 LAB — HEPATIC FUNCTION PANEL
ALT: 21 U/L (ref 0–53)
AST: 17 U/L (ref 0–37)
Albumin: 4.3 g/dL (ref 3.5–5.2)
Alkaline Phosphatase: 110 U/L (ref 39–117)
Bilirubin, Direct: 0.3 mg/dL (ref 0.0–0.3)
Total Bilirubin: 1.3 mg/dL — ABNORMAL HIGH (ref 0.2–1.2)
Total Protein: 7.7 g/dL (ref 6.0–8.3)

## 2023-10-04 LAB — LIPASE: Lipase: 129 U/L — ABNORMAL HIGH (ref 11.0–59.0)

## 2023-10-04 NOTE — Patient Instructions (Addendum)
 Your provider has requested that you go to the basement level for lab work before leaving today. Press "B" on the elevator. The lab is located at the first door on the left as you exit the elevator.  Contact our office if nausea, abdominal pain or diarrhea recurs.  Increase protein in diet.  2 gram low sodium diet.  Due to recent changes in healthcare laws, you may see the results of your imaging and laboratory studies on MyChart before your provider has had a chance to review them.  We understand that in some cases there may be results that are confusing or concerning to you. Not all laboratory results come back in the same time frame and the provider may be waiting for multiple results in order to interpret others.  Please give [place] 48 hours in order for your provider to thoroughly review all the results before contacting the office for clarification of your results.   Thank you for trusting me with your gastrointestinal care!   Alcide Evener, CRNP

## 2023-10-04 NOTE — Telephone Encounter (Signed)
 Pharmacy Patient Advocate Encounter   Received notification from CoverMyMeds that prior authorization for Dexcom G6 Transmitter is required/requested.   Insurance verification completed.   The patient is insured through [facility] ADVANTAGE/RX ADVANCE .   Per test claim: PA required; PA submitted to above mentioned insurance via CoverMyMeds Key/confirmation #/EOC [id number] Status is pending

## 2023-10-04 NOTE — Telephone Encounter (Signed)
 Pharmacy Patient Advocate Encounter  Received notification from [facility] [employer]/RX [employer] that Prior Authorization for Dexcom G6 Transmitter has been DENIED.  Full denial letter will be uploaded to the media tab. See denial reason below.   **Has to be billed under Part B.**   PA #/Case ID/Reference #: [id number]

## 2023-10-05 ENCOUNTER — Telehealth: Payer: Self-pay

## 2023-10-05 DIAGNOSIS — E1122 Type 2 diabetes mellitus with diabetic chronic kidney disease: Secondary | ICD-10-CM | POA: Diagnosis not present

## 2023-10-05 DIAGNOSIS — F1320 Sedative, hypnotic or anxiolytic dependence, uncomplicated: Secondary | ICD-10-CM | POA: Diagnosis not present

## 2023-10-05 DIAGNOSIS — D84821 Immunodeficiency due to drugs: Secondary | ICD-10-CM | POA: Diagnosis not present

## 2023-10-05 DIAGNOSIS — Z794 Long term (current) use of insulin: Secondary | ICD-10-CM | POA: Diagnosis not present

## 2023-10-05 DIAGNOSIS — E1169 Type 2 diabetes mellitus with other specified complication: Secondary | ICD-10-CM | POA: Diagnosis not present

## 2023-10-05 DIAGNOSIS — N1832 Chronic kidney disease, stage 3b: Secondary | ICD-10-CM | POA: Diagnosis not present

## 2023-10-05 DIAGNOSIS — E1140 Type 2 diabetes mellitus with diabetic neuropathy, unspecified: Secondary | ICD-10-CM | POA: Diagnosis not present

## 2023-10-05 DIAGNOSIS — I25119 Atherosclerotic heart disease of native coronary artery with unspecified angina pectoris: Secondary | ICD-10-CM | POA: Diagnosis not present

## 2023-10-05 DIAGNOSIS — F331 Major depressive disorder, recurrent, moderate: Secondary | ICD-10-CM | POA: Diagnosis not present

## 2023-10-05 DIAGNOSIS — E669 Obesity, unspecified: Secondary | ICD-10-CM | POA: Diagnosis not present

## 2023-10-05 DIAGNOSIS — L4050 Arthropathic psoriasis, unspecified: Secondary | ICD-10-CM | POA: Diagnosis not present

## 2023-10-05 DIAGNOSIS — Z89412 Acquired absence of left great toe: Secondary | ICD-10-CM | POA: Diagnosis not present

## 2023-10-05 LAB — HM AWV

## 2023-10-05 LAB — HM DIABETES FOOT EXAM: HM Diabetic Foot Exam: NEGATIVE

## 2023-10-05 LAB — HEMOGLOBIN A1C: Hemoglobin A1C: 8.9

## 2023-10-05 NOTE — Telephone Encounter (Signed)
 Patient called to inquire about patient's preference of supplier to order his Dexcom G6 transmitter. VM left awaiting call back.

## 2023-10-05 NOTE — Progress Notes (Signed)
 Noted.

## 2023-10-06 ENCOUNTER — Telehealth: Payer: Self-pay | Admitting: Nurse Practitioner

## 2023-10-06 ENCOUNTER — Other Ambulatory Visit: Payer: Self-pay

## 2023-10-06 DIAGNOSIS — K859 Acute pancreatitis without necrosis or infection, unspecified: Secondary | ICD-10-CM

## 2023-10-06 NOTE — Addendum Note (Signed)
 Addended by: Rise Paganini on: 10/06/2023 01:21 PM   Modules accepted: Orders

## 2023-10-06 NOTE — Addendum Note (Signed)
 Addended by: Rise Paganini on: 10/06/2023 01:34 PM   Modules accepted: Orders

## 2023-10-06 NOTE — Telephone Encounter (Signed)
 Patient called and stated he was returning a call back to DD. Patient is requesting a call back.Please advise.

## 2023-10-06 NOTE — Telephone Encounter (Signed)
 Patient returned call, please advise.

## 2023-10-06 NOTE — Telephone Encounter (Signed)
 Attempted to contact patient and left a voicemail to return call regarding MRI.

## 2023-10-06 NOTE — Telephone Encounter (Signed)
 Contacted patient and patient verbalized understanding that he would need an abdominal MRI MRCP and that [facility] Scheduling would contact him to schedule.

## 2023-10-07 ENCOUNTER — Other Ambulatory Visit: Payer: Self-pay

## 2023-10-09 ENCOUNTER — Other Ambulatory Visit (HOSPITAL_COMMUNITY): Payer: Self-pay

## 2023-10-09 ENCOUNTER — Other Ambulatory Visit (HOSPITAL_BASED_OUTPATIENT_CLINIC_OR_DEPARTMENT_OTHER): Payer: Self-pay

## 2023-10-10 ENCOUNTER — Ambulatory Visit (HOSPITAL_COMMUNITY)
Admission: RE | Admit: 2023-10-10 | Discharge: 2023-10-10 | Disposition: A | Discharge status: Home / Self Care | Source: Ambulatory Visit | Attending: Nurse Practitioner | Admitting: Nurse Practitioner

## 2023-10-10 ENCOUNTER — Other Ambulatory Visit: Payer: Self-pay | Admitting: Nurse Practitioner

## 2023-10-10 DIAGNOSIS — K859 Acute pancreatitis without necrosis or infection, unspecified: Secondary | ICD-10-CM | POA: Insufficient documentation

## 2023-10-10 DIAGNOSIS — R161 Splenomegaly, not elsewhere classified: Secondary | ICD-10-CM | POA: Diagnosis not present

## 2023-10-10 DIAGNOSIS — K7460 Unspecified cirrhosis of liver: Secondary | ICD-10-CM | POA: Diagnosis not present

## 2023-10-10 DIAGNOSIS — Z9049 Acquired absence of other specified parts of digestive tract: Secondary | ICD-10-CM | POA: Diagnosis not present

## 2023-10-10 MED ORDER — GADOBUTROL 1 MMOL/ML IV SOLN
10.0000 mL | Freq: Once | INTRAVENOUS | Status: AC | PRN
  Administered 2023-10-10: 10 mL via INTRAVENOUS

## 2023-10-11 ENCOUNTER — Other Ambulatory Visit: Payer: Self-pay

## 2023-10-12 ENCOUNTER — Other Ambulatory Visit: Payer: Self-pay

## 2023-10-12 NOTE — Progress Notes (Signed)
 Patient has not filled out form for [employer]. Sending him a new form today. Will follow up with him in a few weeks.

## 2023-10-13 ENCOUNTER — Ambulatory Visit: Payer: Self-pay | Admitting: Behavioral Health

## 2023-10-13 LAB — COMPREHENSIVE METABOLIC PANEL WITH GFR: eGFR: 90.001

## 2023-10-13 LAB — BASIC METABOLIC PANEL WITH GFR: Creatinine: 0.9 (ref 0.6–1.3)

## 2023-10-18 DIAGNOSIS — M5416 Radiculopathy, lumbar region: Secondary | ICD-10-CM | POA: Diagnosis not present

## 2023-10-19 ENCOUNTER — Other Ambulatory Visit: Payer: Self-pay

## 2023-10-19 ENCOUNTER — Encounter: Payer: Self-pay | Admitting: Endocrinology

## 2023-10-19 ENCOUNTER — Other Ambulatory Visit (HOSPITAL_COMMUNITY): Payer: Self-pay

## 2023-10-19 ENCOUNTER — Ambulatory Visit (INDEPENDENT_AMBULATORY_CARE_PROVIDER_SITE_OTHER): Payer: 59 | Admitting: Endocrinology

## 2023-10-19 VITALS — BP 138/86 | HR 86 | Resp 20 | Ht 69.5 in | Wt 235.6 lb

## 2023-10-19 DIAGNOSIS — E1165 Type 2 diabetes mellitus with hyperglycemia: Secondary | ICD-10-CM

## 2023-10-19 DIAGNOSIS — Z794 Long term (current) use of insulin: Secondary | ICD-10-CM

## 2023-10-19 LAB — POCT GLYCOSYLATED HEMOGLOBIN (HGB A1C): Hemoglobin A1C: 9.1 % — AB (ref 4.0–5.6)

## 2023-10-19 MED ORDER — INSULIN LISPRO 100 UNIT/ML IJ SOLN
INTRAMUSCULAR | 2 refills | Status: AC
  Filled 2023-10-19: qty 20, 33d supply, fill #0
  Filled 2023-11-16: qty 20, 33d supply, fill #1

## 2023-10-19 MED ORDER — EMPAGLIFLOZIN 25 MG PO TABS
25.0000 mg | ORAL_TABLET | Freq: Every day | ORAL | 3 refills | Status: DC
  Filled 2023-10-19: qty 90, 90d supply, fill #0

## 2023-10-19 MED ORDER — INSULIN LISPRO 100 UNIT/ML IJ SOLN
INTRAMUSCULAR | 2 refills | Status: DC
  Filled 2023-10-19: qty 20, fill #0

## 2023-10-19 NOTE — Progress Notes (Signed)
 Outpatient Endocrinology Note James Lynae Pederson, MD  10/19/23  Patient's Name: James [name]    DOB: [birth year]    MRN: [id number]                                                    REASON OF VISIT: Follow up for type 2 diabetes mellitus  PCP: Ezell Hollow, MD  HISTORY OF PRESENT ILLNESS:   James [name] is a 68 y.o. old male with with past medical history listed below, is here for follow up of type 2 diabetes mellitus.   Pertinent Diabetes History: He was diagnosed with type 2 diabetes mellitus around 2014.  He was initially treated with metformin  and glimepiride .  With the worsening of blood sugar control multidose insulin  regimen was started around 2017.  He has been on OmniPod insulin  pump from November 2021.  Chronic Diabetes Complications : Retinopathy: no. Last ophthalmology exam was done on annually, reportedly. Nephropathy: CKD, no microalbuminuria. Peripheral neuropathy: yes, on gabapentin  Coronary artery disease: no Stroke: no  Relevant comorbidities and cardiovascular risk factors: Obesity: yes Body mass index is 34.29 kg/m.  Hypertension: yes Hyperlipidemia. Yes, on a statin. He has been following with gastroenterology for liver cirrhosis secondary to NASH.  Current / Home Diabetic regimen includes: Metformin  500 mg 2 times a day. Jardiance  10 mg daily.  Insulin  Pump setting:  OMNIPOD 5 insulin  pump settings as follows with Dexcom G6. Basal total 10.1 units MN- 0.1u/hour 6AM- 0.3  11AM- 0.5 7PM- 0.8  Bolus CHO Ratio (1unit:CHO) MN- 1:1  Correction/Sensitivity: MN- 1:50  Target:  Active insulin  time:  4 hours  Prior diabetic medications: Amaryl  was stopped June 2024. Januvia   CONTINUOUS GLUCOSE MONITORING SYSTEM (CGMS) / INSULIN  PUMP INTERPRETATION:                         OmniPod 5 Pump & Sensor Download (Reviewed and summarized below.) Pump: Dexcom G6 and OmniPod 5 Dates: March 12 to September 21, 2023, 14 days   Average daily carbs  entered: 31 Average total daily insulin :  33.4 units, Basal:22%, Bolus: 78%.   Automated mode/automated limited 0%.  Manual mode 100%.  Automated activity 0%.  He has been bolusing carb 12, 0 - 3 times a day.  Some of the day he does not bolus at all, some of the day he bolus up to 3 times a day.  Currently has not been using insulin  pump for about a month, due to ran out of the insulin .   Glycemic data:   Currently using Dexcom G6 however not able to download in the clinic today.  He has not been  Hypoglycemia: Patient has no hypoglycemic episodes. Patient has hypoglycemia awareness.     Factors modifying glucose control: 1.  Diabetic diet assessment: Not eating much due to abdominal pain/diarrhea/acute gastroenteritis.  2.  Staying active or exercising: No formal exercise.  3.  Medication compliance: compliant most of the time.  Interval history Patient reports he ran out of insulin  and has not been using insulin  pump for about a month.  Discussed about calling our clinic if he not able to refill any of the medications including insulin  in between the visits.  He is having increased thirst and urination.  Dexcom blood sugars showing 300-400 range,  not able to download and review in the clinic today.  Pump downloaded and reviewed as above.  He reports he has been taking Jardiance  and metformin .  No other complaints today.  He was able to get Dexcom G6 transmitter and using Dexcom G6 at this time.  REVIEW OF SYSTEMS As per history of present illness.   PAST MEDICAL HISTORY: Past Medical History:  Diagnosis Date   Abnormal cardiac CT angiography    Acid reflux    Annual physical exam 04/08/2015   Arthritis    Atypical chest pain 06/13/2020   Blood transfusion without reported diagnosis    Body mass index (BMI) 35.0-35.9, adult 04/05/2019   Chronic fatigue 01/28/2015   Chronic headaches    on cymbalta    Chronic kidney disease, stage 3b (HCC) 07/14/2023   Chronic migraine w/o  aura, not intractable, w/o stat migr 10/24/2018   Cirrhosis (HCC)    Colon polyps    Complication of anesthesia    problems waking up from anesthesia   Coronary artery disease 01/23/2019   Depression    on cymbalta    Diabetes mellitus with neuropathy (HCC)    Diabetes with neuropathy 04/25/2013   Diverticulitis 03/2013   Dyslipidemia 05/29/2019   Eczema    Elevated LFTs    Epidural lipomatosis 10/05/2018   Essential (primary) hypertension 04/05/2019   Essential hypertension 10/10/2019   Fatty liver    GERD (gastroesophageal reflux disease) 04/28/2011   H/O craniotomy 05/07/2015   Headache 04/28/2011   Hepatitis 10/2017   NASH cirrhosis   History of kidney stones    Hyperlipidemia    Hypersomnia with sleep apnea 01/28/2015   Hypertension    IDA (iron  deficiency anemia) 01/24/2019   Idiopathic intracranial hypertension 01/14/2017   Insomnia 04/26/2013   Kidney stone    Liver cirrhosis secondary to NASH (nonalcoholic steatohepatitis) (HCC) 01/02/2016   Low back pain 04/05/2019   Lower back injury 08/14/2019   Morbid obesity (HCC)    Neuromuscular disorder (HCC)    neuropathy   Neuropathy    Nonalcoholic steatohepatitis 10/05/2018   Obstructive hydrocephalus (HCC) 01/28/2015   OSA -- dx ~ 2012, cpap intolerant 09/04/2014    dx ~ 2012, cpap intolerant    PCP NOTES >>> 04/08/2015   Post-op pain 03/19/2019   Post-traumatic hydrocephalus (HCC)    s/p shunts x 2 (first got infected )   Presence of cerebrospinal fluid drainage device 07/28/2011   Psoriasis    sees Dr Sanders Crooks   Psoriatic arthritis (HCC)    REM behavioral disorder 01/14/2017   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   see's Dr.Ortmann   Severe obesity (BMI >= 40) (HCC) 01/28/2015   SI (sacroiliac) joint dysfunction 08/14/2019   Sigmoid diverticulitis 04/25/2013   Sleep apnea    no CPAP      Spondylolisthesis, lumbar region 03/16/2019   Stomach ulcer    Testosterone  deficiency 04/28/2011    VP (ventriculoperitoneal) shunt status 07/31/2020    PAST SURGICAL HISTORY: Past Surgical History:  Procedure Laterality Date   AMPUTATION Left 12/27/2021   Procedure: AMPUTATION GREAT TOE;  Surgeon: Charity Conch, DPM;  Location: [facility] OR;  Service: Podiatry;  Laterality: Left;   BACK SURGERY  1980   BRAIN SURGERY     VP shunts placed in 2007   CHOLECYSTECTOMY N/A 08/25/2017   Procedure: LAPAROSCOPIC CHOLECYSTECTOMY WITH INTRAOPERATIVE CHOLANGIOGRAM;  Surgeon: Caralyn Chandler, MD;  Location: [facility] OR;  Service: General;  Laterality: N/A;   COLONOSCOPY  CORONARY STENT INTERVENTION N/A 09/27/2020   Procedure: CORONARY STENT INTERVENTION;  Surgeon: Lucendia Rusk, MD;  Location: [facility] INVASIVE CV LAB;  Service: Cardiovascular;  Laterality: N/A;   CORONARY ULTRASOUND/IVUS N/A 09/27/2020   Procedure: Intravascular Ultrasound/IVUS;  Surgeon: Lucendia Rusk, MD;  Location: [facility] INVASIVE CV LAB;  Service: Cardiovascular;  Laterality: N/A;   JOINT REPLACEMENT     total hip   LEFT HEART CATH N/A 09/27/2020   Procedure: Left Heart Cath;  Surgeon: Lucendia Rusk, MD;  Location: [facility] INVASIVE CV LAB;  Service: Cardiovascular;  Laterality: N/A;   LEFT HEART CATH AND CORONARY ANGIOGRAPHY N/A 09/24/2020   Procedure: LEFT HEART CATH AND CORONARY ANGIOGRAPHY;  Surgeon: Lucendia Rusk, MD;  Location: [facility] INVASIVE CV LAB;  Service: Cardiovascular;  Laterality: N/A;   LEFT HEART CATH AND CORONARY ANGIOGRAPHY N/A 08/18/2021   Procedure: LEFT HEART CATH AND CORONARY ANGIOGRAPHY;  Surgeon: Millicent Ally, MD;  Location: [facility] INVASIVE CV LAB;  Service: Cardiovascular;  Laterality: N/A;   LUMBAR FUSION  03/16/2019   SHOULDER SURGERY Left 2010   TEE WITHOUT CARDIOVERSION N/A 01/01/2022   Procedure: TRANSESOPHAGEAL ECHOCARDIOGRAM (TEE);  Surgeon: Tobb, Kardie, DO;  Location: [facility] ENDOSCOPY;  Service: Cardiovascular;  Laterality: N/A;   TOE SURGERY Left 2018   TONSILLECTOMY     as a child   TOTAL HIP  ARTHROPLASTY Left 2011   UPPER GASTROINTESTINAL ENDOSCOPY  01/04/2020   VENTRICULOPERITONEAL SHUNT  2007   x2    ALLERGIES: Allergies  Allergen Reactions   Bee Venom Anaphylaxis   Hydrocodone  Bit-Homatrop Mbr Other (See Comments)    Hallucinations, confusion, delirium Depressed feeling   Toradol  [Ketorolac  Tromethamine ] Other (See Comments)    Hallucinations, confusion, delirium   Prednisone     Patient reports it causes cirrhosis to flare up   Sulfadiazine     NDC ZOXW:[id number] NDC BJYN:[id number] NDC VHQI:[id number]   Morphine And Codeine Other (See Comments)    Hallucinations, back in the 80s. States has taken vicodin before w/o problems    Sulfa Drugs Cross Reactors Rash    FAMILY HISTORY:  Family History  Problem Relation Age of Onset   Healthy Mother    Lung cancer Father        former smoker   Heart disease Brother        MI age 63   Other Brother        Murdered   Down syndrome Son    Diabetes Neg Hx    Prostate cancer Neg Hx    Colon cancer Neg Hx    Stomach cancer Neg Hx    Pancreatic cancer Neg Hx    Liver disease Neg Hx     SOCIAL HISTORY: Social History   Socioeconomic History   Marital status: Married    Spouse name: James [name]   Number of children: 2   Years of education: Not on file   Highest education level: 12th grade  Occupational History   Occupation: disabled   Tobacco Use   Smoking status: Never   Smokeless tobacco: Never  Vaping Use   Vaping status: Never Used  Substance and Sexual Activity   Alcohol use: No   Drug use: No   Sexual activity: Yes    Partners: Female  Other Topics Concern   Not on file  Social History Narrative   Household-- pt , wife, one adult son with Down's syndrome   younger son lives in [place]   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   Social Drivers of Corporate investment banker Strain: Low Risk  (02/10/2023)   Overall Financial Resource Strain (CARDIA)     Difficulty of Paying Living Expenses: Not hard at all  Food Insecurity: No Food Insecurity (08/19/2023)   Hunger Vital Sign    Worried About Running Out of Food in the Last Year: Never true    Ran Out of Food in the Last Year: Never true  Transportation Needs: No Transportation Needs (08/19/2023)   PRAPARE - Administrator, Civil Service (Medical): No    Lack of Transportation (Non-Medical): No  Physical Activity: Unknown (02/10/2023)   Exercise Vital Sign    Days of Exercise per Week: 0 days    Minutes of Exercise per Session: Not on file  Stress: No Stress Concern Present (02/10/2023)   [employer] of Occupational Health - Occupational Stress Questionnaire    Feeling of Stress : Only a little  Social Connections: Moderately Isolated (08/20/2023)   Social Connection and Isolation Panel [NHANES]    Frequency of Communication with Friends and Family: Once a week    Frequency of Social Gatherings with Friends and Family: More than three times a week    Attends Religious Services: Never    Database administrator or Organizations: No    Attends Engineer, structural: Never    Marital Status: Married    MEDICATIONS:  Current Outpatient Medications  Medication Sig Dispense Refill   acetaminophen  (TYLENOL ) 325 MG tablet Take 2 tablets (650 mg total) by mouth every 4 (four) hours as needed for headache or mild pain.     adalimumab  (HUMIRA , 2 PEN,) 40 MG/0.8ML AJKT pen Inject 0.8 mLs (40 mg total) into the skin every 14 (fourteen) days. 2 each 5   amLODipine  (NORVASC ) 5 MG tablet Take 1 tablet (5 mg total) by mouth daily. 90 tablet 3   aspirin  81 MG chewable tablet Chew 1 tablet (81 mg total) by mouth daily.     atorvastatin  (LIPITOR ) 80 MG tablet Take 1 tablet (80 mg total) by mouth at bedtime. 90 tablet 1   botulinum toxin Type A  (BOTOX ) 200 units injection Provider to inject 155 units into the muscles of the head and neck every 3 months. Discard remainder. (Patient  taking differently: Inject 200 Units into the muscle every 3 (three) months. Provider to inject 155 units into the muscles of the head and neck every 3 months. Discard remainder.) 1 each 2   carvedilol  (COREG ) 12.5 MG tablet Take 1 tablet (12.5 mg total) by mouth 2 (two) times daily with a meal. 180 tablet 1   clobetasol  cream (TEMOVATE ) 0.05 % Apply 1 Application topically 2 (two) times daily for 14 days. 60 g 1   clonazePAM  (KLONOPIN ) 0.5 MG tablet Take 1 tablet (0.5 mg total) by mouth at bedtime. 30 tablet 5   Continuous Glucose Sensor (DEXCOM G6 SENSOR) MISC Use to monitor blood sugar, change after 10 days (Patient taking differently: 1 each by Other route See admin instructions. Use to monitor blood sugar, change after 10 days) 9 each 3   Continuous Glucose Transmitter (DEXCOM G6 TRANSMITTER) MISC Use as directed for 90 days 1 each 3   dicyclomine  (BENTYL ) 20 MG tablet Take 1 tablet (20 mg total) by mouth 2 (two) times daily as needed. (Patient taking differently: Take 20 mg by mouth 2 (two) times daily as needed for spasms.) 10 tablet 0   DULoxetine  (CYMBALTA ) 60  MG capsule Take 2 capsules (120 mg total) by mouth daily. 180 capsule 1   EPINEPHrine  (EPIPEN  2-PAK) 0.3 mg/0.3 mL IJ SOAJ injection Inject 0.3 mLs (0.3 mg total) into the muscle once as needed for up to 1 dose for anaphylaxis. 2 each 2   gabapentin  (NEURONTIN ) 600 MG tablet Take 1 tablet (600 mg total) by mouth 2 (two) times daily. 180 tablet 1   Insulin  Disposable Pump (OMNIPOD 5 G6 PODS, GEN 5,) MISC Use as directed every 3 (three) days. 10 each 3   Insulin  Syringe-Needle U-100 (INSULIN  SYRINGE 1CC/31GX5/16") 31G X 5/16" 1 ML MISC Use to administer Humalog  3 times a day (Patient taking differently: 1 tablet by Other route See admin instructions. Use to administer Humalog  3 times a day) 100 each 0   isosorbide  mononitrate (IMDUR ) 120 MG 24 hr tablet Take 1 tablet (120 mg total) by mouth daily. 90 tablet 2   metFORMIN  (GLUCOPHAGE ) 500 MG  tablet Take 1 tablet (500 mg total) by mouth 2 (two) times daily. 180 tablet 3   nitroGLYCERIN  (NITROSTAT ) 0.4 MG SL tablet Place 1 tablet (0.4 mg total) under the tongue every 5 (five) minutes for 3 doses as needed for chest pain. 25 tablet 6   ondansetron  (ZOFRAN ) 4 MG tablet Take 1 tablet (4 mg total) by mouth every 6 (six) hours. 12 tablet 0   pantoprazole  (PROTONIX ) 40 MG tablet Take 1 tablet (40 mg total) by mouth daily. 90 tablet 1   traMADol  (ULTRAM ) 50 MG tablet Take 1 tablet (50 mg total) by mouth every 6 (six) hours as needed for up to 10 doses for severe pain. 10 tablet 0   empagliflozin  (JARDIANCE ) 25 MG TABS tablet Take 1 tablet (25 mg total) by mouth daily with breakfast. 90 tablet 3   insulin  lispro (HUMALOG ) 100 UNIT/ML injection Use up to 60 Units daily in pump 20 mL 2   Current Facility-Administered Medications  Medication Dose Route Frequency Provider Last Rate Last Admin   botulinum toxin Type A  (BOTOX ) injection 155 Units  155 Units Intramuscular Once Sater, Richard A, MD        PHYSICAL EXAM: Vitals:   10/19/23 0950  BP: 138/86  Pulse: 86  Resp: 20  SpO2: 97%  Weight: 235 lb 9.6 oz (106.9 kg)  Height: 5' 9.5" (1.765 m)   Body mass index is 34.29 kg/m.  Wt Readings from Last 3 Encounters:  10/19/23 235 lb 9.6 oz (106.9 kg)  10/04/23 234 lb 2 oz (106.2 kg)  09/27/23 220 lb (99.8 kg)    General: Well developed, well nourished male in no apparent distress.  HEENT: AT/[place], no external lesions.  Eyes: Conjunctiva clear and no icterus. Neck: Neck supple  Lungs: Respirations not labored Neurologic: Alert, oriented, normal speech Extremities / Skin: Dry.   Psychiatric: Does not appear depressed or anxious  Diabetic Foot Exam - Simple   No data filed    LABS Reviewed Lab Results  Component Value Date   HGBA1C 9.1 (A) 10/19/2023   HGBA1C 7.9 (A) 07/16/2023   HGBA1C 7.3 (A) 02/11/2023   Lab Results  Component Value Date   FRUCTOSAMINE 305 (H) 05/16/2021    FRUCTOSAMINE 342 (H) 12/31/2020   FRUCTOSAMINE 317 (H) 06/18/2020   Lab Results  Component Value Date   CHOL 132 01/20/2023   HDL 41.00 01/20/2023   LDLCALC 68 01/30/2022   LDLDIRECT 59.0 01/20/2023   TRIG 145 08/20/2023   CHOLHDL 3 01/20/2023   Lab Results  Component  Value Date   MICRALBCREAT 0.6 01/20/2023   MICRALBCREAT 1.5 01/30/2022   Lab Results  Component Value Date   CREATININE 1.35 (H) 09/27/2023   Lab Results  Component Value Date   GFR 55.10 (L) 08/30/2023    ASSESSMENT / PLAN  1. Type 2 diabetes mellitus with hyperglycemia, with long-term current use of insulin  (HCC)   2. Uncontrolled type 2 diabetes mellitus with hyperglycemia, with long-term current use of insulin  (HCC)   3. Uncontrolled type 2 diabetes mellitus with hyperglycemia (HCC)     Diabetes Mellitus type 2, complicated by peripheral neuropathy and CKD. - Diabetic status / severity: Uncontrolled.  Lab Results  Component Value Date   HGBA1C 9.1 (A) 10/19/2023    - Hemoglobin A1c goal <6.5%   He has not been using insulin  pump for about a month due to ran out of the insulin .  Insulin  Humalog  refilled.  He has been using Dexcom G6, not able to download however has been running in the range of 300-400 because of not being on insulin  pump.  - Medications: Low.  Plan: -Continue OmniPod 5 with Dexcom G6 on current setting.  No change in setting today.  About using automated mode. -Advised patient to bolus for meals 10-15 carb based on the meal size and carbohydrate content.  Currently he has been using carb count of 12 or 13 and missing the meal bolus as well. -Advised to use the automated mode of the insulin  pump. - Increase Jardiance  10 mg daily to 5 mg. -Continue metformin  500 mg 2 times a day.   - Home glucose testing: continue CGM and check blood glucose as needed.  - Discussed/ Gave Hypoglycemia treatment plan.  # Consult : not required at this time.   # Annual urine for  microalbuminuria/ creatinine ratio, no microalbuminuria currently. Last  Lab Results  Component Value Date   MICRALBCREAT 0.6 01/20/2023    # Foot check nightly / neuropathy, continue gabapentin .  # Annual dilated diabetic eye exams.   - Diet: Make healthy diabetic food choices - Life style / activity / exercise: Discussed.  2. Blood pressure  -  BP Readings from Last 1 Encounters:  10/19/23 138/86    - Control is in target.  - No change in current plans.  3. Lipid status / Hyperlipidemia - Last  Lab Results  Component Value Date   LDLCALC 68 01/30/2022   - Continue atorvastatin  80 mg daily.  Managed by primary care provider. He is also on ? fenofibrate  3 times a week.  Diagnoses and all orders for this visit:  Type 2 diabetes mellitus with hyperglycemia, with long-term current use of insulin  (HCC) -     POCT glycosylated hemoglobin (Hb A1C) -     empagliflozin  (JARDIANCE ) 25 MG TABS tablet; Take 1 tablet (25 mg total) by mouth daily with breakfast.  Uncontrolled type 2 diabetes mellitus with hyperglycemia, with long-term current use of insulin  (HCC) -     insulin  lispro (HUMALOG ) 100 UNIT/ML injection; Use up to 60 Units daily in pump  Uncontrolled type 2 diabetes mellitus with hyperglycemia (HCC)   DISPOSITION Follow up in clinic in 3 months suggested.   All questions answered and patient verbalized understanding of the plan.  James Markala Sitts, MD [facility] Endocrinology [facility] [address] [place], [address] [place], [place] [address] Phone # [phone]  At least part of this note was generated using voice recognition software. Inadvertent word errors may have occurred, which were not recognized  during the proofreading process.

## 2023-10-25 ENCOUNTER — Telehealth: Payer: Self-pay

## 2023-10-25 NOTE — Telephone Encounter (Signed)
 Message sent in [employer] that insurance was not accepted. Sent to another supplier

## 2023-10-26 DIAGNOSIS — Z982 Presence of cerebrospinal fluid drainage device: Secondary | ICD-10-CM | POA: Diagnosis not present

## 2023-10-26 DIAGNOSIS — Z6832 Body mass index (BMI) 32.0-32.9, adult: Secondary | ICD-10-CM | POA: Diagnosis not present

## 2023-10-29 ENCOUNTER — Other Ambulatory Visit (HOSPITAL_COMMUNITY): Payer: Self-pay

## 2023-11-04 ENCOUNTER — Other Ambulatory Visit (HOSPITAL_COMMUNITY): Payer: Self-pay

## 2023-11-04 ENCOUNTER — Other Ambulatory Visit: Payer: Self-pay

## 2023-11-04 ENCOUNTER — Other Ambulatory Visit: Payer: Self-pay | Admitting: Internal Medicine

## 2023-11-04 MED ORDER — CARVEDILOL 12.5 MG PO TABS
12.5000 mg | ORAL_TABLET | Freq: Two times a day (BID) | ORAL | 1 refills | Status: DC
  Filled 2023-11-04 (×2): qty 180, 90d supply, fill #0

## 2023-11-07 NOTE — Progress Notes (Unsigned)
 Assessment: 1. Organic impotence     Plan: Continue Trimix 10/30/1 0.5 ml/injection. Return to office in 6 months  Chief Complaint:  Chief Complaint  Patient presents with   Erectile Dysfunction    History of Present Illness:  James [name] is a 68 y.o. male who is seen for further evaluation of erectile dysfunction. He has a long history of erectile dysfunction.  At his initial visit in February 2025, he reported that he was unable to achieve a spontaneous erection.  He was not having any nocturnal or early morning erections.  No decrease in his libido.  He had previously tried penile injections in 2022.  He reported that these did not work well for him.  It is unclear if he ever titrated up from the starting dose.  He had not used any treatment recently. Risk factors for erectile dysfunction include hypertension, hypercholesterolemia, diabetes, coronary artery disease, beta-blocker use. He is currently on isosorbide  mono nitrate for his coronary artery disease.  He reported some mild lower urinary tract symptoms with frequency and nocturia.  No dysuria or gross hematuria. IPSS = 7. PSA 2/25: 0.65.  He has a history of low testosterone  in 2016.  Testosterone  level at that time was 217.  Repeat testosterone  was 342.  He was instructed on penile injection technique in March 2025.  He was started on Trimix 10/30/1 at a dose of 0.3 mL/inj.  He returns today for follow-up.  He is currently using 0.5 mL of the Trimix with good results.  He had a satisfactory erection which lasted less than 1 hour.  No problems with the injection.  No side effects.  Portions of the above documentation were copied from a prior visit for review purposes only.  Past Medical History:  Past Medical History:  Diagnosis Date   Abnormal cardiac CT angiography    Acid reflux    Annual physical exam 04/08/2015   Arthritis    Atypical chest pain 06/13/2020   Blood transfusion without reported  diagnosis    Body mass index (BMI) 35.0-35.9, adult 04/05/2019   Chronic fatigue 01/28/2015   Chronic headaches    on cymbalta    Chronic kidney disease, stage 3b (HCC) 07/14/2023   Chronic migraine w/o aura, not intractable, w/o stat migr 10/24/2018   Cirrhosis (HCC)    Colon polyps    Complication of anesthesia    problems waking up from anesthesia   Coronary artery disease 01/23/2019   Depression    on cymbalta    Diabetes mellitus with neuropathy (HCC)    Diabetes with neuropathy 04/25/2013   Diverticulitis 03/2013   Dyslipidemia 05/29/2019   Eczema    Elevated LFTs    Epidural lipomatosis 10/05/2018   Essential (primary) hypertension 04/05/2019   Essential hypertension 10/10/2019   Fatty liver    GERD (gastroesophageal reflux disease) 04/28/2011   H/O craniotomy 05/07/2015   Headache 04/28/2011   Hepatitis 10/2017   NASH cirrhosis   History of kidney stones    Hyperlipidemia    Hypersomnia with sleep apnea 01/28/2015   Hypertension    IDA (iron  deficiency anemia) 01/24/2019   Idiopathic intracranial hypertension 01/14/2017   Insomnia 04/26/2013   Kidney stone    Liver cirrhosis secondary to NASH (nonalcoholic steatohepatitis) (HCC) 01/02/2016   Low back pain 04/05/2019   Lower back injury 08/14/2019   Morbid obesity (HCC)    Neuromuscular disorder (HCC)    neuropathy   Neuropathy    Nonalcoholic steatohepatitis 10/05/2018   Obstructive  hydrocephalus (HCC) 01/28/2015   OSA -- dx ~ 2012, cpap intolerant 09/04/2014    dx ~ 2012, cpap intolerant    PCP NOTES >>> 04/08/2015   Post-op pain 03/19/2019   Post-traumatic hydrocephalus (HCC)    s/p shunts x 2 (first got infected )   Presence of cerebrospinal fluid drainage device 07/28/2011   Psoriasis    sees Dr Sanders Crooks   Psoriatic arthritis (HCC)    REM behavioral disorder 01/14/2017   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   see's Dr.Ortmann   Severe obesity (BMI >= 40) (HCC) 01/28/2015   SI  (sacroiliac) joint dysfunction 08/14/2019   Sigmoid diverticulitis 04/25/2013   Sleep apnea    no CPAP      Spondylolisthesis, lumbar region 03/16/2019   Stomach ulcer    Testosterone  deficiency 04/28/2011   VP (ventriculoperitoneal) shunt status 07/31/2020    Past Surgical History:  Past Surgical History:  Procedure Laterality Date   AMPUTATION Left 12/27/2021   Procedure: AMPUTATION GREAT TOE;  Surgeon: Charity Conch, DPM;  Location: [facility] OR;  Service: Podiatry;  Laterality: Left;   BACK SURGERY  1980   BRAIN SURGERY     VP shunts placed in 2007   CHOLECYSTECTOMY N/A 08/25/2017   Procedure: LAPAROSCOPIC CHOLECYSTECTOMY WITH INTRAOPERATIVE CHOLANGIOGRAM;  Surgeon: Caralyn Chandler, MD;  Location: [facility] OR;  Service: General;  Laterality: N/A;   COLONOSCOPY     CORONARY STENT INTERVENTION N/A 09/27/2020   Procedure: CORONARY STENT INTERVENTION;  Surgeon: Lucendia Rusk, MD;  Location: [facility] INVASIVE CV LAB;  Service: Cardiovascular;  Laterality: N/A;   CORONARY ULTRASOUND/IVUS N/A 09/27/2020   Procedure: Intravascular Ultrasound/IVUS;  Surgeon: Lucendia Rusk, MD;  Location: [facility] INVASIVE CV LAB;  Service: Cardiovascular;  Laterality: N/A;   JOINT REPLACEMENT     total hip   LEFT HEART CATH N/A 09/27/2020   Procedure: Left Heart Cath;  Surgeon: Lucendia Rusk, MD;  Location: [facility] INVASIVE CV LAB;  Service: Cardiovascular;  Laterality: N/A;   LEFT HEART CATH AND CORONARY ANGIOGRAPHY N/A 09/24/2020   Procedure: LEFT HEART CATH AND CORONARY ANGIOGRAPHY;  Surgeon: Lucendia Rusk, MD;  Location: [facility] INVASIVE CV LAB;  Service: Cardiovascular;  Laterality: N/A;   LEFT HEART CATH AND CORONARY ANGIOGRAPHY N/A 08/18/2021   Procedure: LEFT HEART CATH AND CORONARY ANGIOGRAPHY;  Surgeon: Millicent Ally, MD;  Location: [facility] INVASIVE CV LAB;  Service: Cardiovascular;  Laterality: N/A;   LUMBAR FUSION  03/16/2019   SHOULDER SURGERY Left 2010   TEE WITHOUT CARDIOVERSION N/A 01/01/2022   Procedure:  TRANSESOPHAGEAL ECHOCARDIOGRAM (TEE);  Surgeon: Tobb, Kardie, DO;  Location: [facility] ENDOSCOPY;  Service: Cardiovascular;  Laterality: N/A;   TOE SURGERY Left 2018   TONSILLECTOMY     as a child   TOTAL HIP ARTHROPLASTY Left 2011   UPPER GASTROINTESTINAL ENDOSCOPY  01/04/2020   VENTRICULOPERITONEAL SHUNT  2007   x2    Allergies:  Allergies  Allergen Reactions   Bee Venom Anaphylaxis   Hydrocodone  Bit-Homatrop Mbr Other (See Comments)    Hallucinations, confusion, delirium Depressed feeling   Toradol  [Ketorolac  Tromethamine ] Other (See Comments)    Hallucinations, confusion, delirium   Prednisone     Patient reports it causes cirrhosis to flare up   Sulfadiazine     NDC ZOXW:[id number] NDC BJYN:[id number] NDC VHQI:[id number]   Morphine And Codeine Other (See Comments)    Hallucinations, back in the 80s. States has taken vicodin before w/o problems    Sulfa Drugs [employer]  Rash    Family History:  Family History  Problem Relation Age of Onset   Healthy Mother    Lung cancer Father        former smoker   Heart disease Brother        MI age 24   Other Brother        Murdered   Down syndrome Son    Diabetes Neg Hx    Prostate cancer Neg Hx    Colon cancer Neg Hx    Stomach cancer Neg Hx    Pancreatic cancer Neg Hx    Liver disease Neg Hx     Social History:  Social History   Tobacco Use   Smoking status: Never   Smokeless tobacco: Never  Vaping Use   Vaping status: Never Used  Substance Use Topics   Alcohol use: No   Drug use: No    ROS: Constitutional:  Negative for fever, chills, weight loss CV: Negative for chest pain, previous MI, hypertension Respiratory:  Negative for shortness of breath, wheezing, sleep apnea, frequent cough GI:  Negative for nausea, vomiting, bloody stool, GERD  Physical exam: BP 132/87   Pulse 85   Ht 5\' 9"  (1.753 m)   Wt 230 lb (104.3 kg)   BMI 33.97 kg/m  GENERAL APPEARANCE:  Well appearing, well developed, well  nourished, NAD HEENT:  Atraumatic, normocephalic, oropharynx clear NECK:  Supple without lymphadenopathy or thyromegaly ABDOMEN:  Soft, non-tender, no masses EXTREMITIES:  Moves all extremities well, without clubbing, cyanosis, or edema NEUROLOGIC:  Alert and oriented x 3, normal gait, CN [name]-XII grossly intact MENTAL STATUS:  appropriate BACK:  Non-tender to palpation, No CVAT SKIN:  Warm, dry, and intact   Results: None

## 2023-11-08 ENCOUNTER — Ambulatory Visit: Admitting: Family Medicine

## 2023-11-08 ENCOUNTER — Encounter: Payer: Self-pay | Admitting: Urology

## 2023-11-08 ENCOUNTER — Ambulatory Visit (INDEPENDENT_AMBULATORY_CARE_PROVIDER_SITE_OTHER): Admitting: Urology

## 2023-11-08 VITALS — BP 132/87 | HR 85 | Ht 69.0 in | Wt 230.0 lb

## 2023-11-08 DIAGNOSIS — N529 Male erectile dysfunction, unspecified: Secondary | ICD-10-CM

## 2023-11-09 ENCOUNTER — Other Ambulatory Visit (HOSPITAL_BASED_OUTPATIENT_CLINIC_OR_DEPARTMENT_OTHER): Payer: Self-pay

## 2023-11-09 ENCOUNTER — Other Ambulatory Visit: Payer: Self-pay

## 2023-11-09 ENCOUNTER — Encounter: Payer: Self-pay | Admitting: Internal Medicine

## 2023-11-09 ENCOUNTER — Ambulatory Visit: Admitting: Internal Medicine

## 2023-11-09 VITALS — BP 116/66 | HR 83 | Temp 98.3°F | Resp 16 | Ht 69.0 in | Wt 223.0 lb

## 2023-11-09 DIAGNOSIS — E1140 Type 2 diabetes mellitus with diabetic neuropathy, unspecified: Secondary | ICD-10-CM

## 2023-11-09 DIAGNOSIS — I10 Essential (primary) hypertension: Secondary | ICD-10-CM | POA: Diagnosis not present

## 2023-11-09 DIAGNOSIS — L97521 Non-pressure chronic ulcer of other part of left foot limited to breakdown of skin: Secondary | ICD-10-CM | POA: Diagnosis not present

## 2023-11-09 DIAGNOSIS — E785 Hyperlipidemia, unspecified: Secondary | ICD-10-CM

## 2023-11-09 DIAGNOSIS — F32A Depression, unspecified: Secondary | ICD-10-CM | POA: Diagnosis not present

## 2023-11-09 LAB — BASIC METABOLIC PANEL WITH GFR
BUN: 15 mg/dL (ref 6–23)
CO2: 23 meq/L (ref 19–32)
Calcium: 9.5 mg/dL (ref 8.4–10.5)
Chloride: 105 meq/L (ref 96–112)
Creatinine, Ser: 1.16 mg/dL (ref 0.40–1.50)
GFR: 66.01 mL/min (ref >60.00)
Glucose, Bld: 168 mg/dL — ABNORMAL HIGH (ref 70–99)
Potassium: 4.2 meq/L (ref 3.5–5.1)
Sodium: 139 meq/L (ref 135–145)

## 2023-11-09 MED ORDER — MUPIROCIN 2 % EX OINT
1.0000 | TOPICAL_OINTMENT | Freq: Two times a day (BID) | CUTANEOUS | 0 refills | Status: AC
  Filled 2023-11-09: qty 22, 11d supply, fill #0

## 2023-11-09 MED ORDER — MUPIROCIN 2 % EX OINT
1.0000 | TOPICAL_OINTMENT | Freq: Two times a day (BID) | CUTANEOUS | 0 refills | Status: DC
  Filled 2023-11-09: qty 22, 11d supply, fill #0

## 2023-11-09 NOTE — Progress Notes (Signed)
 Pausing patient enrollment - patient assistance still pending. Tiffany will follow up with patient regarding manufacture copay assistance.

## 2023-11-09 NOTE — Progress Notes (Unsigned)
 Subjective:    Patient ID: James [name], male    DOB: [birth year], 68 y.o.   MRN: [id number]  DOS:  11/09/2023 Type of visit - description: Follow-up from previous visit  Since the last visit, he separated from his wife. Denies feeling depressed or anxious. No suicidal or violent thoughts. Reports good medication compliance.  2 weeks ago developed a blister at the left great toe.  Is already getting better.  Still have some serous discharge noted on his sock at the end of the day.  Review of Systems See above   Past Medical History:  Diagnosis Date   Abnormal cardiac CT angiography    Acid reflux    Annual physical exam 04/08/2015   Arthritis    Atypical chest pain 06/13/2020   Blood transfusion without reported diagnosis    Body mass index (BMI) 35.0-35.9, adult 04/05/2019   Chronic fatigue 01/28/2015   Chronic headaches    on cymbalta    Chronic kidney disease, stage 3b (HCC) 07/14/2023   Chronic migraine w/o aura, not intractable, w/o stat migr 10/24/2018   Cirrhosis (HCC)    Colon polyps    Complication of anesthesia    problems waking up from anesthesia   Coronary artery disease 01/23/2019   Depression    on cymbalta    Diabetes mellitus with neuropathy (HCC)    Diabetes with neuropathy 04/25/2013   Diverticulitis 03/2013   Dyslipidemia 05/29/2019   Eczema    Elevated LFTs    Epidural lipomatosis 10/05/2018   Essential (primary) hypertension 04/05/2019   Essential hypertension 10/10/2019   Fatty liver    GERD (gastroesophageal reflux disease) 04/28/2011   H/O craniotomy 05/07/2015   Headache 04/28/2011   Hepatitis 10/2017   NASH cirrhosis   History of kidney stones    Hyperlipidemia    Hypersomnia with sleep apnea 01/28/2015   Hypertension    IDA (iron  deficiency anemia) 01/24/2019   Idiopathic intracranial hypertension 01/14/2017   Insomnia 04/26/2013   Kidney stone    Liver cirrhosis secondary to NASH (nonalcoholic steatohepatitis) (HCC)  01/02/2016   Low back pain 04/05/2019   Lower back injury 08/14/2019   Morbid obesity (HCC)    Neuromuscular disorder (HCC)    neuropathy   Neuropathy    Nonalcoholic steatohepatitis 10/05/2018   Obstructive hydrocephalus (HCC) 01/28/2015   OSA -- dx ~ 2012, cpap intolerant 09/04/2014    dx ~ 2012, cpap intolerant    PCP NOTES >>> 04/08/2015   Post-op pain 03/19/2019   Post-traumatic hydrocephalus (HCC)    s/p shunts x 2 (first got infected )   Presence of cerebrospinal fluid drainage device 07/28/2011   Psoriasis    sees Dr Sanders Crooks   Psoriatic arthritis (HCC)    REM behavioral disorder 01/14/2017   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   see's Dr.Ortmann   Severe obesity (BMI >= 40) (HCC) 01/28/2015   SI (sacroiliac) joint dysfunction 08/14/2019   Sigmoid diverticulitis 04/25/2013   Sleep apnea    no CPAP      Spondylolisthesis, lumbar region 03/16/2019   Stomach ulcer    Testosterone  deficiency 04/28/2011   VP (ventriculoperitoneal) shunt status 07/31/2020    Past Surgical History:  Procedure Laterality Date   AMPUTATION Left 12/27/2021   Procedure: AMPUTATION GREAT TOE;  Surgeon: Charity Conch, DPM;  Location: [facility] OR;  Service: Podiatry;  Laterality: Left;   BACK SURGERY  1980   BRAIN SURGERY     VP shunts placed in 2007  CHOLECYSTECTOMY N/A 08/25/2017   Procedure: LAPAROSCOPIC CHOLECYSTECTOMY WITH INTRAOPERATIVE CHOLANGIOGRAM;  Surgeon: Caralyn Chandler, MD;  Location: [facility] OR;  Service: General;  Laterality: N/A;   COLONOSCOPY     CORONARY STENT INTERVENTION N/A 09/27/2020   Procedure: CORONARY STENT INTERVENTION;  Surgeon: Lucendia Rusk, MD;  Location: [facility] INVASIVE CV LAB;  Service: Cardiovascular;  Laterality: N/A;   CORONARY ULTRASOUND/IVUS N/A 09/27/2020   Procedure: Intravascular Ultrasound/IVUS;  Surgeon: Lucendia Rusk, MD;  Location: [facility] INVASIVE CV LAB;  Service: Cardiovascular;  Laterality: N/A;   JOINT REPLACEMENT     total hip    LEFT HEART CATH N/A 09/27/2020   Procedure: Left Heart Cath;  Surgeon: Lucendia Rusk, MD;  Location: [facility] INVASIVE CV LAB;  Service: Cardiovascular;  Laterality: N/A;   LEFT HEART CATH AND CORONARY ANGIOGRAPHY N/A 09/24/2020   Procedure: LEFT HEART CATH AND CORONARY ANGIOGRAPHY;  Surgeon: Lucendia Rusk, MD;  Location: [facility] INVASIVE CV LAB;  Service: Cardiovascular;  Laterality: N/A;   LEFT HEART CATH AND CORONARY ANGIOGRAPHY N/A 08/18/2021   Procedure: LEFT HEART CATH AND CORONARY ANGIOGRAPHY;  Surgeon: Millicent Ally, MD;  Location: [facility] INVASIVE CV LAB;  Service: Cardiovascular;  Laterality: N/A;   LUMBAR FUSION  03/16/2019   SHOULDER SURGERY Left 2010   TEE WITHOUT CARDIOVERSION N/A 01/01/2022   Procedure: TRANSESOPHAGEAL ECHOCARDIOGRAM (TEE);  Surgeon: Tobb, Kardie, DO;  Location: [facility] ENDOSCOPY;  Service: Cardiovascular;  Laterality: N/A;   TOE SURGERY Left 2018   TONSILLECTOMY     as a child   TOTAL HIP ARTHROPLASTY Left 2011   UPPER GASTROINTESTINAL ENDOSCOPY  01/04/2020   VENTRICULOPERITONEAL SHUNT  2007   x2    Current Outpatient Medications  Medication Instructions   acetaminophen  (TYLENOL ) 650 mg, Oral, Every 4 hours PRN   amLODipine  (NORVASC ) 5 mg, Oral, Daily   aspirin  81 mg, Oral, Daily   atorvastatin  (LIPITOR ) 80 mg, Oral, Daily at bedtime   botulinum toxin Type A  (BOTOX ) 200 units injection Provider to inject 155 units into the muscles of the head and neck every 3 months. Discard remainder.   carvedilol  (COREG ) 12.5 mg, Oral, 2 times daily with meals   clonazePAM  (KLONOPIN ) 0.5 mg, Oral, Daily at bedtime   Continuous Glucose Sensor (DEXCOM G6 SENSOR) MISC Use to monitor blood sugar, change after 10 days   Continuous Glucose Transmitter (DEXCOM G6 TRANSMITTER) MISC Use as directed for 90 days   dicyclomine  (BENTYL ) 20 mg, Oral, 2 times daily PRN   DULoxetine  (CYMBALTA ) 120 mg, Oral, Daily   EPINEPHrine  (EPIPEN  2-PAK) 0.3 mg, Intramuscular, Once PRN   gabapentin   (NEURONTIN ) 600 mg, Oral, 2 times daily   Humira  (2 Pen) 40 mg, Subcutaneous, Every 14 days   Insulin  Disposable Pump (OMNIPOD 5 G6 PODS, GEN 5,) MISC Use as directed every 3 (three) days.   insulin  lispro (HUMALOG ) 100 UNIT/ML injection Use up to 60 Units daily in pump   Insulin  Syringe-Needle U-100 (INSULIN  SYRINGE 1CC/31GX5/16") 31G X 5/16" 1 ML MISC Use to administer Humalog  3 times a day   isosorbide  mononitrate (IMDUR ) 120 mg, Oral, Daily   Jardiance  25 mg, Oral, Daily with breakfast   metFORMIN  (GLUCOPHAGE ) 500 mg, Oral, 2 times daily   nitroGLYCERIN  (NITROSTAT ) 0.4 MG SL tablet Place 1 tablet (0.4 mg total) under the tongue every 5 (five) minutes for 3 doses as needed for chest pain.   ondansetron  (ZOFRAN ) 4 mg, Oral, Every 6 hours   pantoprazole  (PROTONIX ) 40 mg, Oral, Daily  traMADol  (ULTRAM ) 50 mg, Oral, Every 6 hours PRN       Objective:   Physical Exam BP 116/66   Pulse 83   Temp 98.3 F (36.8 C) (Oral)   Resp 16   Ht 5\' 9"  (1.753 m)   Wt 223 lb (101.2 kg)   SpO2 94%   BMI 32.93 kg/m   General:   Well developed, NAD, BMI noted. HEENT:  Normocephalic . Face symmetric, atraumatic   Skin: See picture of the left great toe (partially amputated in the past).  No redness, no pockets, no swelling. Neurologic:  alert & oriented X3.  Speech normal, gait appropriate for age and unassisted Psych--  Cognition and judgment appear intact.  Cooperative with normal attention span and concentration.  Behavior appropriate. No anxious or depressed appearing.        Assessment     Problem list: DM -Per Endo Neuropathy (x years, rx gaba 05-2014, w/u 11-2014  RPR neg, vit D-B12-Folic Acid  wnl ); Saw Dr Lydia Sams, [facility] [phone] (see results) HTN: History of AKI with ARBs  CKD, used to see  nephrology, etiology- Hyperglycemia, intermittent NSAIDs, contrast exposures Hyperlipidemia (TG in the 500s 2016) OSA , dx 2012,  again 02-2015 Dr Dohmeier--> severe OSA, intolerant to CPAP,  not interested on treatment (see OV 12-2022) Depression, insomnia: on Cymbalta  NEURO: --Chronic headaches :on Cymbalta   --Posttraumatic hydrocephalus s/p 2 shunts (first got infected) MSK: on disability d/t back pain- HAs GI:  --GERD, diverticulitis 2014, h/o PUD -- NASH with cirrhosis per GI note 10/2017, s/p Hep A/B shots --Anemia: - felt to be d/t   GAVE (gastric antral vascular ectasia) and a inflammatory polyp, s/p  EGD 10/2018    -Work-up repeated 10/2019: EGD: GAVE  versus portal hypertensive gastropathy. Colonoscopy polyps.  Tubular adenoma. Gastric BX negative H. pylori, reactive changes. Psoriasis, psoriatic arthritis: used  HUMIRA   CAD: CP, PTCA of the LAD 09-2020; cath 07-2021 H/o urolithiasis Hypogonadism  Dx 2012, normal T 11-2014 (on no RX) 09/14/2023: Normal stress test  PLAN  Usual behavior: See LOV, since then, he is separated from his wife, living in his own apartment here in [place], he is still in a "relationship" with a male online.  Does not know what he is going to do in the intermediate future. Denies depression, anxiety or violent thoughts.  DM: Last visit with Endo 10/19/2023.  A1c 9.1 Ulcer, left great toe: Started 2 weeks ago was a blister after doing some yard work he said.  Already getting better.  Plan: Mupirocin , if not back to normal in 2 to 3 weeks he will reach out. Diarrhea: Was seen at the ER 09/27/2023 with abdominal pain and diarrhea, prescribed vancomycin .  Saw GI 10/04/2023, recommend to continue vancomycin  and call them as needed.  Symptoms resolved NASH cirrhosis: Per GI ED: Saw urology yesterday,  continue Trimix injections  09/14/2023: Normal stress test RTC 6 months  ===== Chest pain: Since the ER visit recently no further pain, saw cardiology, they ordered stress test Unusual behavior: See HPI for details, the patient has been engaging with different females online and giving money away. The patient's wife like to be sure is nothing  medically wrong with him, at this point I do not believe he has a condition that clouded his judgment. Fortunately, there is no safe issue with no guns in the house and no thoughts of violence or suicide. My recommendation is to see a counselor and figure their relationship out. James [name] also interested on  a psychiatry evaluation so we will refer him. DM: No evidence of low sugars that could account for some behavioral issues. Med compliance: Apparently not taking medication as prescribed, gets medications once daily instead of twice daily for instance. Encourage compliance with medications as prescribed, also routine meal schedule which he is not followed. RTC 2 months  Time spent 30 min reviewing records, listening carefully to the wife side of the story, explaining why I do not think ther

## 2023-11-09 NOTE — Patient Instructions (Addendum)
 INSTRUCTIONS  FOR TODAY  Apply the ointment called mupirocin  to your great toe twice a day. If the area is not back to normal in the next 2 weeks please call the office for a referral.        GO TO THE LAB : Get the blood work     Next office visit for a checkup in 6 months Please make an appointment before you leave today

## 2023-11-10 ENCOUNTER — Ambulatory Visit: Payer: Self-pay | Admitting: Internal Medicine

## 2023-11-10 NOTE — Assessment & Plan Note (Addendum)
 Unusual behavior: See LOV, since then, he is separated from his wife, living in his own apartment here in [place], he is still in a "relationship" with a male online.  Does not know what he is going to do in the  future. Denies depression, anxiety or violent thoughts. Depression: See above, controlled, on Cymbalta  DM: Last visit with Endo 10/19/2023.  A1c 9.1 Ulcer, left great toe: Started 2 weeks ago was a blister after doing some yard work he said.  Already getting better.  Plan: Mupirocin , if not back to normal in 2 to 3 weeks he will reach out. Diarrhea: Was seen at the ER 09/27/2023 with abdominal pain and diarrhea, prescribed vancomycin .  Saw GI 10/04/2023, recommend to continue vancomycin  and call them as needed.  Symptoms resolved NASH cirrhosis: Per GI ED: Saw urology yesterday,  continue Trimix injections CAD: 09/14/2023: Normal stress test RTC 6 months

## 2023-11-12 ENCOUNTER — Other Ambulatory Visit: Payer: Self-pay

## 2023-11-16 ENCOUNTER — Other Ambulatory Visit: Payer: Self-pay

## 2023-11-16 ENCOUNTER — Other Ambulatory Visit (HOSPITAL_COMMUNITY): Payer: Self-pay

## 2023-11-19 ENCOUNTER — Telehealth: Payer: Self-pay

## 2023-11-19 NOTE — Telephone Encounter (Signed)
 Received message in parachute that they have been trying to reach patient requesting office call patient and give him call back number [phone]. VM left for patient.

## 2023-11-23 ENCOUNTER — Other Ambulatory Visit (HOSPITAL_COMMUNITY): Payer: Self-pay

## 2023-11-23 ENCOUNTER — Other Ambulatory Visit: Payer: Self-pay

## 2023-11-29 ENCOUNTER — Other Ambulatory Visit: Payer: Self-pay

## 2023-11-29 MED ORDER — ISOSORBIDE MONONITRATE ER 120 MG PO TB24: 120.0000 mg | ORAL_TABLET | Freq: Every day | ORAL | 2 refills | Status: DC

## 2023-11-29 MED ORDER — AMLODIPINE BESYLATE 5 MG PO TABS: 5.0000 mg | ORAL_TABLET | Freq: Every day | ORAL | 2 refills | Status: DC

## 2023-12-01 ENCOUNTER — Other Ambulatory Visit (HOSPITAL_BASED_OUTPATIENT_CLINIC_OR_DEPARTMENT_OTHER): Payer: Self-pay

## 2023-12-01 ENCOUNTER — Telehealth: Payer: Self-pay

## 2023-12-01 ENCOUNTER — Other Ambulatory Visit: Payer: Self-pay

## 2023-12-01 ENCOUNTER — Other Ambulatory Visit (HOSPITAL_COMMUNITY): Payer: Self-pay

## 2023-12-01 ENCOUNTER — Encounter: Payer: Self-pay | Admitting: Internal Medicine

## 2023-12-01 MED ORDER — PANTOPRAZOLE SODIUM 40 MG PO TBEC: 40.0000 mg | DELAYED_RELEASE_TABLET | Freq: Every day | ORAL | 1 refills | Status: DC

## 2023-12-01 MED ORDER — DULOXETINE HCL 60 MG PO CPEP
120.0000 mg | ORAL_CAPSULE | Freq: Every day | ORAL | 1 refills | Status: DC
Start: 2023-12-01 — End: 2024-01-25

## 2023-12-01 MED ORDER — GABAPENTIN 600 MG PO TABS: 600.0000 mg | ORAL_TABLET | Freq: Two times a day (BID) | ORAL | 1 refills | Status: DC

## 2023-12-01 MED ORDER — CARVEDILOL 12.5 MG PO TABS: 12.5000 mg | ORAL_TABLET | Freq: Two times a day (BID) | ORAL | 1 refills | Status: DC

## 2023-12-01 MED ORDER — ATORVASTATIN CALCIUM 80 MG PO TABS: 80.0000 mg | ORAL_TABLET | Freq: Every day | ORAL | 1 refills | Status: DC

## 2023-12-01 NOTE — Telephone Encounter (Signed)
 Copied from CRM [id number]. Topic: Clinical - Prescription Issue >> Dec 01, 2023  2:19 PM Chuck Crater wrote: Reason for CRM: Patient is new with [facility] and is needing 5 medications sent.  pantoprazole  (PROTONIX ) 40 MG tablet carvedilol  (COREG ) 12.5 MG tablet gabapentin  (NEURONTIN ) 600 MG tablet DULoxetine  (CYMBALTA ) 60 MG capsule atorvastatin  (LIPITOR ) 80 MG tablet

## 2023-12-01 NOTE — Telephone Encounter (Signed)
 Rxs sent

## 2023-12-08 ENCOUNTER — Telehealth: Payer: Self-pay | Admitting: Internal Medicine

## 2023-12-08 NOTE — Telephone Encounter (Signed)
 Copied from CRM [id number]. Topic: Medical Record Request - Other >> Dec 08, 2023 11:30 AM Alyse July wrote: Reason for CRM: [facility] would like a call back to confirm patient allergy list. CB# [phone].

## 2023-12-08 NOTE — Telephone Encounter (Signed)
 Called [facility]- fax number- [phone] given to fax allergy list to them.

## 2023-12-09 ENCOUNTER — Telehealth: Payer: Self-pay

## 2023-12-09 DIAGNOSIS — L409 Psoriasis, unspecified: Secondary | ICD-10-CM | POA: Diagnosis not present

## 2023-12-09 NOTE — Telephone Encounter (Signed)
 Pharmacy left message requesting clarification of medication.

## 2023-12-09 NOTE — Telephone Encounter (Signed)
 Humalog  is prescribed up to 60 units/day via insulin  pump.

## 2023-12-10 ENCOUNTER — Telehealth: Payer: Self-pay | Admitting: Cardiology

## 2023-12-10 NOTE — Telephone Encounter (Signed)
 Spoke with Office manager. She was wanting list of Drug allergies.

## 2023-12-10 NOTE — Telephone Encounter (Signed)
 New Message:   She needs a list of all of patient's allergies.

## 2023-12-28 ENCOUNTER — Other Ambulatory Visit: Payer: Self-pay

## 2023-12-28 ENCOUNTER — Ambulatory Visit: Admitting: Neurology

## 2023-12-28 ENCOUNTER — Encounter: Payer: Self-pay | Admitting: Internal Medicine

## 2023-12-28 ENCOUNTER — Telehealth: Payer: Self-pay | Admitting: Neurology

## 2023-12-28 ENCOUNTER — Encounter (HOSPITAL_BASED_OUTPATIENT_CLINIC_OR_DEPARTMENT_OTHER): Payer: Self-pay | Admitting: Emergency Medicine

## 2023-12-28 ENCOUNTER — Emergency Department (HOSPITAL_BASED_OUTPATIENT_CLINIC_OR_DEPARTMENT_OTHER)
Admission: EM | Admit: 2023-12-28 | Discharge: 2023-12-28 | Disposition: A | Discharge status: Home / Self Care | Source: Ambulatory Visit | Attending: Emergency Medicine | Admitting: Emergency Medicine

## 2023-12-28 ENCOUNTER — Ambulatory Visit (INDEPENDENT_AMBULATORY_CARE_PROVIDER_SITE_OTHER): Admitting: Internal Medicine

## 2023-12-28 VITALS — BP 120/82 | HR 82 | Temp 97.8°F | Resp 16 | Ht 69.0 in | Wt 219.2 lb

## 2023-12-28 DIAGNOSIS — Z79899 Other long term (current) drug therapy: Secondary | ICD-10-CM | POA: Diagnosis not present

## 2023-12-28 DIAGNOSIS — R197 Diarrhea, unspecified: Secondary | ICD-10-CM | POA: Diagnosis not present

## 2023-12-28 DIAGNOSIS — Z7982 Long term (current) use of aspirin: Secondary | ICD-10-CM | POA: Diagnosis not present

## 2023-12-28 DIAGNOSIS — Z7984 Long term (current) use of oral hypoglycemic drugs: Secondary | ICD-10-CM | POA: Insufficient documentation

## 2023-12-28 DIAGNOSIS — I951 Orthostatic hypotension: Secondary | ICD-10-CM

## 2023-12-28 DIAGNOSIS — E1122 Type 2 diabetes mellitus with diabetic chronic kidney disease: Secondary | ICD-10-CM | POA: Insufficient documentation

## 2023-12-28 DIAGNOSIS — I2510 Atherosclerotic heart disease of native coronary artery without angina pectoris: Secondary | ICD-10-CM | POA: Diagnosis not present

## 2023-12-28 DIAGNOSIS — Z794 Long term (current) use of insulin: Secondary | ICD-10-CM | POA: Diagnosis not present

## 2023-12-28 DIAGNOSIS — R6883 Chills (without fever): Secondary | ICD-10-CM | POA: Diagnosis not present

## 2023-12-28 DIAGNOSIS — I129 Hypertensive chronic kidney disease with stage 1 through stage 4 chronic kidney disease, or unspecified chronic kidney disease: Secondary | ICD-10-CM | POA: Insufficient documentation

## 2023-12-28 DIAGNOSIS — N1832 Chronic kidney disease, stage 3b: Secondary | ICD-10-CM | POA: Diagnosis not present

## 2023-12-28 LAB — I-STAT CHEM 8, ED
BUN: 20 mg/dL (ref 8–23)
Calcium, Ion: 1.22 mmol/L (ref 1.15–1.40)
Chloride: 103 mmol/L (ref 98–111)
Creatinine, Ser: 1.4 mg/dL — ABNORMAL HIGH (ref 0.61–1.24)
Glucose, Bld: 235 mg/dL — ABNORMAL HIGH (ref 70–99)
HCT: 47 % (ref 39.0–52.0)
Hemoglobin: 16 g/dL (ref 13.0–17.0)
Potassium: 4.5 mmol/L (ref 3.5–5.1)
Sodium: 138 mmol/L (ref 135–145)
TCO2: 22 mmol/L (ref 22–32)

## 2023-12-28 LAB — HEPATIC FUNCTION PANEL
ALT: 20 U/L (ref 0–44)
AST: 24 U/L (ref 15–41)
Albumin: 4.2 g/dL (ref 3.5–5.0)
Alkaline Phosphatase: 147 U/L — ABNORMAL HIGH (ref 38–126)
Bilirubin, Direct: 0.6 mg/dL — ABNORMAL HIGH (ref 0.0–0.2)
Indirect Bilirubin: 1 mg/dL — ABNORMAL HIGH (ref 0.3–0.9)
Total Bilirubin: 1.6 mg/dL — ABNORMAL HIGH (ref 0.0–1.2)
Total Protein: 8.2 g/dL — ABNORMAL HIGH (ref 6.5–8.1)

## 2023-12-28 LAB — CBC
HCT: 43.8 % (ref 39.0–52.0)
Hemoglobin: 14.4 g/dL (ref 13.0–17.0)
MCH: 28 pg (ref 26.0–34.0)
MCHC: 32.9 g/dL (ref 30.0–36.0)
MCV: 85.2 fL (ref 80.0–100.0)
Platelets: 210 10*3/uL (ref 150–400)
RBC: 5.14 MIL/uL (ref 4.22–5.81)
RDW: 15.5 % (ref 11.5–15.5)
WBC: 6.6 10*3/uL (ref 4.0–10.5)
nRBC: 0 % (ref 0.0–0.2)

## 2023-12-28 LAB — URINALYSIS, ROUTINE W REFLEX MICROSCOPIC
Bilirubin Urine: NEGATIVE
Glucose, UA: >=500 mg/dL — AB
Hgb urine dipstick: NEGATIVE
Ketones, ur: NEGATIVE mg/dL
Leukocytes,Ua: NEGATIVE
Nitrite: NEGATIVE
Protein, ur: NEGATIVE mg/dL
Specific Gravity, Urine: 1.01 (ref 1.005–1.030)
pH: 5.5 (ref 5.0–8.0)

## 2023-12-28 LAB — URINALYSIS, MICROSCOPIC (REFLEX): Squamous Epithelial / HPF: NONE SEEN /HPF (ref 0–5)

## 2023-12-28 LAB — LIPASE, BLOOD: Lipase: 22 U/L (ref 11–51)

## 2023-12-28 LAB — MAGNESIUM: Magnesium: 1.9 mg/dL (ref 1.7–2.4)

## 2023-12-28 MED ORDER — SODIUM CHLORIDE 0.9 % IV BOLUS
1000.0000 mL | Freq: Once | INTRAVENOUS | Status: AC
  Administered 2023-12-28: 1000 mL via INTRAVENOUS

## 2023-12-28 MED ORDER — ONDANSETRON HCL 4 MG/2ML IJ SOLN
4.0000 mg | Freq: Once | INTRAMUSCULAR | Status: AC
  Administered 2023-12-28: 4 mg via INTRAVENOUS
  Filled 2023-12-28: qty 2

## 2023-12-28 MED ORDER — ONDANSETRON 4 MG PO TBDP
4.0000 mg | ORAL_TABLET | Freq: Three times a day (TID) | ORAL | 0 refills | Status: DC | PRN
  Filled 2023-12-28: qty 20, 7d supply, fill #0

## 2023-12-28 NOTE — Discharge Instructions (Addendum)
 As discussed, recommend continued oral hydration at home with electrolyte rich fluids.  See information attached to your discharge papers regarding dietary recommendations for diarrhea.  He may use over-the-counter Pepto-Bismol/Imodium  for persistent/excessive diarrhea.  Recommend follow-up with your primary care/GI specialist for reassessment.

## 2023-12-28 NOTE — ED Triage Notes (Signed)
 Reports was at PCP same building , sent to Ed for orthostatic  hypotension.  Alert and oriented x 4 . Denies pain yet reports lethargy  , headache , diarrhea x 3 days . Abdominal pain .

## 2023-12-28 NOTE — Progress Notes (Deleted)
 Botox - 200 units x 1 vial Lot: [id number] Expiration: 03/2026 NDC: 9976-6078-97  Bacteriostatic 0.9% Sodium Chloride - 4 mL  Lot: [id number] Expiration: 04/28/2025 NDC: 9590-8033-97  Dx: H56.290  B/B Witnessed by Maurilio Molt, RN

## 2023-12-28 NOTE — Telephone Encounter (Signed)
 request to cancel appointment , pt reported he was being admitted into the hospital, will call back to r/s

## 2023-12-28 NOTE — ED Provider Notes (Signed)
 [facility] Provider Note   CSN: [id number] Arrival date & time: 12/28/23  1415     Patient presents with: Diarrhea   James [name] is a 68 y.o. male.    Diarrhea   68 year old male presents emergency department complaints of diarrhea, abdominal pain.  Symptoms present for the past 3 days.  Denies any known sick exposure.  Reports cramping diffuse abdominal pain.  Was seen at primary care earlier today found to have orthostatic hypotension prompting visit to the emergency department.  Patient reports generalized fatigue, decreased energy levels since symptoms began.  Denies any recent antibiotic use, hiking/camping, international travel.  Denies any fevers, chills, vomiting, chest pain, shortness of breath.  Does report some associated nausea.  Patient was seen for similar symptoms in March of this year and states his current symptoms feel about the same.  Past medical history significant for CKD 3B, VP shunt, hypertension, hyperlipidemia, CAD, liver cirrhosis secondary to NASH, hepatitis, obstructive hydrocephalus, diabetes mellitus, psoriatic arthritis,  Prior to Admission medications   Medication Sig Start Date End Date Taking? Authorizing Provider  acetaminophen  (TYLENOL ) 325 MG tablet Take 2 tablets (650 mg total) by mouth every 4 (four) hours as needed for headache or mild pain. 08/18/21   Marylu Leita SAUNDERS, NP  adalimumab  (HUMIRA , 2 PEN,) 40 MG/0.8ML AJKT pen Inject 0.8 mLs (40 mg total) into the skin every 14 (fourteen) days. Patient not taking: Reported on 12/28/2023 08/30/23     amLODipine  (NORVASC ) 5 MG tablet Take 1 tablet (5 mg total) by mouth daily. 11/29/23   Krasowski, Robert J, MD  aspirin  81 MG chewable tablet Chew 1 tablet (81 mg total) by mouth daily. 08/19/21   Marylu Leita SAUNDERS, NP  atorvastatin  (LIPITOR ) 80 MG tablet Take 1 tablet (80 mg total) by mouth at bedtime. 12/01/23   Amon Aloysius BRAVO, MD  botulinum toxin Type A  (BOTOX ) 200  units injection Provider to inject 155 units into the muscles of the head and neck every 3 months. Discard remainder. Patient taking differently: Inject 200 Units into the muscle every 3 (three) months. Provider to inject 155 units into the muscles of the head and neck every 3 months. Discard remainder. 08/24/23   Sater, Charlie DELENA, MD  carvedilol  (COREG ) 12.5 MG tablet Take 1 tablet (12.5 mg total) by mouth 2 (two) times daily with a meal. 12/01/23   Amon Aloysius BRAVO, MD  clonazePAM  (KLONOPIN ) 0.5 MG tablet Take 1 tablet (0.5 mg total) by mouth at bedtime. 09/28/23 03/26/24  Sater, Charlie DELENA, MD  Continuous Glucose Sensor (DEXCOM G6 SENSOR) MISC Use to monitor blood sugar, change after 10 days Patient taking differently: 1 each by Other route See admin instructions. Use to monitor blood sugar, change after 10 days 07/16/23   Thapa, [place], MD  Continuous Glucose Transmitter (DEXCOM G6 TRANSMITTER) MISC Use as directed for 90 days 07/16/23   Thapa, [place], MD  dicyclomine  (BENTYL ) 20 MG tablet Take 1 tablet (20 mg total) by mouth 2 (two) times daily as needed. Patient taking differently: Take 20 mg by mouth 2 (two) times daily as needed for spasms. 02/12/23   Gowens, Mariah L, PA-C  DULoxetine  (CYMBALTA ) 60 MG capsule Take 2 capsules (120 mg total) by mouth daily. 12/01/23   Amon Aloysius BRAVO, MD  empagliflozin  (JARDIANCE ) 25 MG TABS tablet Take 1 tablet (25 mg total) by mouth daily with breakfast. 10/19/23   Thapa, [place], MD  EPINEPHrine  (EPIPEN  2-PAK) 0.3 mg/0.3  mL IJ SOAJ injection Inject 0.3 mLs (0.3 mg total) into the muscle once as needed for up to 1 dose for anaphylaxis. Patient not taking: Reported on 12/28/2023 01/10/20   Amon Aloysius BRAVO, MD  gabapentin  (NEURONTIN ) 600 MG tablet Take 1 tablet (600 mg total) by mouth 2 (two) times daily. 12/01/23   Paz, Jose E, MD  Insulin  Disposable Pump (OMNIPOD 5 G6 PODS, GEN 5,) MISC Use as directed every 3 (three) days. 09/09/22   [place]on [place], MD  insulin  lispro (HUMALOG ) 100 UNIT/ML  injection Use up to 60 Units daily in pump 10/19/23   Thapa, [place], MD  Insulin  Syringe-Needle U-100 (INSULIN  SYRINGE 1CC/31GX5/16) 31G X 5/16 1 ML MISC Use to administer Humalog  3 times a day Patient taking differently: 1 tablet by Other route See admin instructions. Use to administer Humalog  3 times a day 10/07/22   [place]on [place], MD  isosorbide  mononitrate (IMDUR ) 120 MG 24 hr tablet Take 1 tablet (120 mg total) by mouth daily. 11/29/23   Krasowski, Robert J, MD  metFORMIN  (GLUCOPHAGE ) 500 MG tablet Take 1 tablet (500 mg total) by mouth 2 (two) times daily. 04/16/23   Thapa, [place], MD  mupirocin  ointment (BACTROBAN ) 2 % Apply 1 Application topically 2 (two) times daily. 11/09/23   Amon Aloysius BRAVO, MD  nitroGLYCERIN  (NITROSTAT ) 0.4 MG SL tablet Place 1 tablet (0.4 mg total) under the tongue every 5 (five) minutes for 3 doses as needed for chest pain. Patient not taking: Reported on 12/28/2023 09/10/23   Krasowski, Robert J, MD  pantoprazole  (PROTONIX ) 40 MG tablet Take 1 tablet (40 mg total) by mouth daily. 12/01/23   Amon Aloysius BRAVO, MD    Allergies: Bee venom, Hydrocodone  bit-homatrop mbr, Toradol  [ketorolac  tromethamine ], Prednisone, Sulfadiazine, Morphine and codeine, and Sulfa drugs cross reactors    Review of Systems  Gastrointestinal:  Positive for diarrhea.  All other systems reviewed and are negative.   Updated Vital Signs BP 116/76   Pulse 81   Temp 98 F (36.7 C) (Oral)   Resp 16   Wt 97.1 kg   SpO2 95%   BMI 31.60 kg/m   Physical Exam Vitals and nursing note reviewed.  Constitutional:      General: He is not in acute distress.    Appearance: He is well-developed.  HENT:     Head: Normocephalic and atraumatic.   Eyes:     Conjunctiva/sclera: Conjunctivae normal.    Cardiovascular:     Rate and Rhythm: Normal rate and regular rhythm.     Heart sounds: No murmur heard. Pulmonary:     Effort: Pulmonary effort is normal. No respiratory distress.     Breath sounds: Normal breath  sounds.  Abdominal:     General: There is no distension.     Palpations: Abdomen is soft.     Tenderness: There is no guarding.     Comments: Mild generalized abdominal tenderness.   Musculoskeletal:        General: No swelling.     Cervical back: Neck supple.   Skin:    General: Skin is warm and dry.     Capillary Refill: Capillary refill takes less than 2 seconds.   Neurological:     Mental Status: He is alert.   Psychiatric:        Mood and Affect: Mood normal.    (all labs ordered are listed, but only abnormal results are displayed) Labs Reviewed  I-STAT CHEM 8, ED - Abnormal; Notable for the following  components:      Result Value   Creatinine, Ser 1.40 (*)    Glucose, Bld 235 (*)    All other components within normal limits  C DIFFICILE QUICK SCREEN W PCR REFLEX    CBC  LIPASE, BLOOD  URINALYSIS, ROUTINE W REFLEX MICROSCOPIC  MAGNESIUM    EKG: None  Radiology: No results found.   Procedures   Medications Ordered in the ED  sodium chloride  0.9 % bolus 1,000 mL (1,000 mLs Intravenous New Bag/Given 12/28/23 1438)  ondansetron  (ZOFRAN ) injection 4 mg (4 mg Intravenous Given 12/28/23 1438)    Clinical Course as of 12/28/23 1753  Tue Dec 28, 2023  1632 Reevaluation the patient after IV fluids administered showed significant improvement of symptoms.  Repeat abdominal exam benign.  Awaiting send off labs, follow-up hepatic function, magnesium, lipase as are laboratory equipment is not working. [CR]    Clinical Course User Index [CR] Silver Wonda LABOR, PA                                 Medical Decision Making Amount and/or Complexity of Data Reviewed Labs: ordered.  Risk Prescription drug management.  Gonorrhea This patient presents to the ED for concern of diarrhea, this involves an extensive number of treatment options, and is a complaint that carries with it a high risk of complications and morbidity.  The differential diagnosis includes viral  gastroenteritis, C. difficile, IBS, IBD, malignancy, colitis, other   Co morbidities that complicate the patient evaluation  See HPI   Additional history obtained:  Additional history obtained from EMR External records from outside source obtained and reviewed including hospital records   Lab Tests:  I Ordered, and personally interpreted labs.  The pertinent results include: No leukocytosis.  No evidence of anemia.  Platelets within range.  No transaminitis.  Patient with evidence of AKI  on Chem-8 panel creatinine 1.4.  No transaminitis.  Patient with elevated total bilirubin of 1.6 up which seems to have been elevated for the past 6 months or so.  UA with greater than 500 glucose; patient is on Jardiance .   Imaging Studies ordered:  N/a   Cardiac Monitoring: / EKG:  The patient was maintained on a cardiac monitor.  I personally viewed and interpreted the cardiac monitored which showed an underlying rhythm of: sinus rhythm   Consultations Obtained:  N/a   Problem List / ED Course / Critical interventions / Medication management  Diarrhea I ordered medication including normal saline, Zofran    Reevaluation of the patient after these medicines showed that the patient improved I have reviewed the patients home medicines and have made adjustments as needed   Social Determinants of Health:  Denies tobacco, illicit drug use   Test / Admission - Considered:  Diarrhea Vitals signs within normal range and stable throughout visit. Laboratory/imaging studies significant for: See above 68 year old male presents emergency department complaints of diarrhea, abdominal pain.  Symptoms present for the past 3 days.  Denies any known sick exposure.  Reports cramping diffuse abdominal pain.  Was seen at primary care earlier today found to have orthostatic hypotension prompting visit to the emergency department.  Patient reports generalized fatigue, decreased energy levels since  symptoms began.  Denies any recent antibiotic use, hiking/camping, international travel.  Denies any fevers, chills, vomiting, chest pain, shortness of breath.  Does report some associated nausea.  Patient was seen for similar symptoms in March of  this year and states his current symptoms feel about the same. On exam, initially generalized abdominal discomfort with no real focal tenderness.  Labs concerning for AKI creatinine 1.4 on Chem-8 from baseline around 1-1.3.  Patient treated with IV fluids, antiemetic while lab tests were pending and patient noted resolution of symptoms.  Offered CT imaging with patient deferred.  Repeat abdominal exams benign.  Patient does have history of C. difficile so was desiring to obtain stool sample which was unable to be obtained in the ED.  Offered empiric treatment until able to obtain a stool sample but this was also deferred.  Will recommend maintaining oral hydration at home over-the-counter antidiarrheals as needed.  Will recommend close follow-up with primary care/GI in the outpatient setting as he seems to have already established care with with providers.  Treatment plan discussed with patient and he acknowledged understanding was agreeable to said plan.  Patient will well-appearing, afebrile in no acute distress tolerating p.o. without difficulty upon discharge. Worrisome signs and symptoms were discussed with the patient, and the patient acknowledged understanding to return to the ED if noticed. Patient was stable upon discharge.       Final diagnoses:  None    ED Discharge Orders     None          Silver Wonda LABOR, [place] 12/28/23 1901    Emil Share, DO 12/29/23 [phone]

## 2023-12-28 NOTE — Progress Notes (Unsigned)
 Subjective:    Patient ID: James [name], male    DOB: 1955/12/06, 68 y.o.   MRN: [id number]  DOS:  12/28/2023 Type of visit - description: Acute  Symptoms started 2 to 3 days ago Generalized abdominal discomfort associated with diarrhea and chills. + Subjective fever mostly at night. His stools are watery, yellow in color, no blood.  Has approximately 3 or 4 episodes of diarrhea a day, small amounts. Some nausea, no vomiting. No dysuria or gross hematuria. Occasionally gets dizzy when he stands up.  Review of Systems See above   Past Medical History:  Diagnosis Date   Abnormal cardiac CT angiography    Acid reflux    Annual physical exam 04/08/2015   Arthritis    Atypical chest pain 06/13/2020   Blood transfusion without reported diagnosis    Body mass index (BMI) 35.0-35.9, adult 04/05/2019   Chronic fatigue 01/28/2015   Chronic headaches    on cymbalta    Chronic kidney disease, stage 3b (HCC) 07/14/2023   Chronic migraine w/o aura, not intractable, w/o stat migr 10/24/2018   Cirrhosis (HCC)    Colon polyps    Complication of anesthesia    problems waking up from anesthesia   Coronary artery disease 01/23/2019   Depression    on cymbalta    Diabetes mellitus with neuropathy (HCC)    Diabetes with neuropathy 04/25/2013   Diverticulitis 03/2013   Dyslipidemia 05/29/2019   Eczema    Elevated LFTs    Epidural lipomatosis 10/05/2018   Essential (primary) hypertension 04/05/2019   Essential hypertension 10/10/2019   Fatty liver    GERD (gastroesophageal reflux disease) 04/28/2011   H/O craniotomy 05/07/2015   Headache 04/28/2011   Hepatitis 10/2017   NASH cirrhosis   History of kidney stones    Hyperlipidemia    Hypersomnia with sleep apnea 01/28/2015   Hypertension    IDA (iron  deficiency anemia) 01/24/2019   Idiopathic intracranial hypertension 01/14/2017   Insomnia 04/26/2013   Kidney stone    Liver cirrhosis secondary to NASH (nonalcoholic  steatohepatitis) (HCC) 01/02/2016   Low back pain 04/05/2019   Lower back injury 08/14/2019   Morbid obesity (HCC)    Neuromuscular disorder (HCC)    neuropathy   Neuropathy    Nonalcoholic steatohepatitis 10/05/2018   Obstructive hydrocephalus (HCC) 01/28/2015   OSA -- dx ~ 2012, cpap intolerant 09/04/2014    dx ~ 2012, cpap intolerant    PCP NOTES >>> 04/08/2015   Post-op pain 03/19/2019   Post-traumatic hydrocephalus (HCC)    s/p shunts x 2 (first got infected )   Presence of cerebrospinal fluid drainage device 07/28/2011   Psoriasis    sees Dr Leanord   Psoriatic arthritis (HCC)    REM behavioral disorder 01/14/2017   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   see's Dr.Ortmann   Severe obesity (BMI >= 40) (HCC) 01/28/2015   SI (sacroiliac) joint dysfunction 08/14/2019   Sigmoid diverticulitis 04/25/2013   Sleep apnea    no CPAP      Spondylolisthesis, lumbar region 03/16/2019   Stomach ulcer    Testosterone  deficiency 04/28/2011   VP (ventriculoperitoneal) shunt status 07/31/2020    Past Surgical History:  Procedure Laterality Date   AMPUTATION Left 12/27/2021   Procedure: AMPUTATION GREAT TOE;  Surgeon: Gershon Donnice SAUNDERS, DPM;  Location: [facility] OR;  Service: Podiatry;  Laterality: Left;   BACK SURGERY  1980   BRAIN SURGERY     VP shunts placed in 2007  CHOLECYSTECTOMY N/A 08/25/2017   Procedure: LAPAROSCOPIC CHOLECYSTECTOMY WITH INTRAOPERATIVE CHOLANGIOGRAM;  Surgeon: Curvin Deward MOULD, MD;  Location[facility] OR;  Service: General;  Laterality: N/A;   COLONOSCOPY     CORONARY STENT INTERVENTION N/A 09/27/2020   Procedure: CORONARY STENT INTERVENTION;  Surgeon: Dann Candyce RAMAN, MD;  Location: [facility] INVASIVE CV LAB;  Service: Cardiovascular;  Laterality: N/A;   CORONARY ULTRASOUND/IVUS N/A 09/27/2020   Procedure: Intravascular Ultrasound/IVUS;  Surgeon: Dann Candyce RAMAN, MD;  Location: [facility] INVASIVE CV LAB;  Service: Cardiovascular;  Laterality: N/A;   JOINT  REPLACEMENT     total hip   LEFT HEART CATH N/A 09/27/2020   Procedure: Left Heart Cath;  Surgeon: Dann Candyce RAMAN, MD;  Location: [facility] INVASIVE CV LAB;  Service: Cardiovascular;  Laterality: N/A;   LEFT HEART CATH AND CORONARY ANGIOGRAPHY N/A 09/24/2020   Procedure: LEFT HEART CATH AND CORONARY ANGIOGRAPHY;  Surgeon: Dann Candyce RAMAN, MD;  Location: [facility] INVASIVE CV LAB;  Service: Cardiovascular;  Laterality: N/A;   LEFT HEART CATH AND CORONARY ANGIOGRAPHY N/A 08/18/2021   Procedure: LEFT HEART CATH AND CORONARY ANGIOGRAPHY;  Surgeon: Burnard Debby LABOR, MD;  Location: [facility] INVASIVE CV LAB;  Service: Cardiovascular;  Laterality: N/A;   LUMBAR FUSION  03/16/2019   SHOULDER SURGERY Left 2010   TEE WITHOUT CARDIOVERSION N/A 01/01/2022   Procedure: TRANSESOPHAGEAL ECHOCARDIOGRAM (TEE);  Surgeon: Tobb, Kardie, DO;  Location: [facility] ENDOSCOPY;  Service: Cardiovascular;  Laterality: N/A;   TOE SURGERY Left 2018   TONSILLECTOMY     as a child   TOTAL HIP ARTHROPLASTY Left 2011   UPPER GASTROINTESTINAL ENDOSCOPY  01/04/2020   VENTRICULOPERITONEAL SHUNT  2007   x2    Current Outpatient Medications  Medication Instructions   acetaminophen  (TYLENOL ) 650 mg, Oral, Every 4 hours PRN   amLODipine  (NORVASC ) 5 mg, Oral, Daily   aspirin  81 mg, Oral, Daily   atorvastatin  (LIPITOR ) 80 mg, Oral, Daily at bedtime   botulinum toxin Type A  (BOTOX ) 200 units injection Provider to inject 155 units into the muscles of the head and neck every 3 months. Discard remainder.   carvedilol  (COREG ) 12.5 mg, Oral, 2 times daily with meals   clonazePAM  (KLONOPIN ) 0.5 mg, Oral, Daily at bedtime   Continuous Glucose Sensor (DEXCOM G6 SENSOR) MISC Use to monitor blood sugar, change after 10 days   Continuous Glucose Transmitter (DEXCOM G6 TRANSMITTER) MISC Use as directed for 90 days   dicyclomine  (BENTYL ) 20 mg, Oral, 2 times daily PRN   DULoxetine  (CYMBALTA ) 120 mg, Oral, Daily   empagliflozin  (JARDIANCE ) 25 mg, Oral, Daily with  breakfast   EPINEPHrine  (EPIPEN  2-PAK) 0.3 mg, Intramuscular, Once PRN   gabapentin  (NEURONTIN ) 600 mg, Oral, 2 times daily   Humira  (2 Pen) 40 mg, Subcutaneous, Every 14 days   Insulin  Disposable Pump (OMNIPOD 5 G6 PODS, GEN 5,) MISC Use as directed every 3 (three) days.   insulin  lispro (HUMALOG ) 100 UNIT/ML injection Use up to 60 Units daily in pump   Insulin  Syringe-Needle U-100 (INSULIN  SYRINGE 1CC/31GX5/16) 31G X 5/16 1 ML MISC Use to administer Humalog  3 times a day   isosorbide  mononitrate (IMDUR ) 120 mg, Oral, Daily   metFORMIN  (GLUCOPHAGE ) 500 mg, Oral, 2 times daily   mupirocin  ointment (BACTROBAN ) 2 % 1 Application, Topical, 2 times daily   nitroGLYCERIN  (NITROSTAT ) 0.4 MG SL tablet Place 1 tablet (0.4 mg total) under the tongue every 5 (five) minutes for 3 doses as needed for chest pain.   pantoprazole  (PROTONIX ) 40 mg,  Oral, Daily       Objective:   Physical Exam BP 120/82   Pulse 82   Temp 97.8 F (36.6 C) (Oral)   Resp 16   Ht 5' 9 (1.753 m)   Wt 219 lb 4 oz (99.5 kg)   SpO2 94%   BMI 32.38 kg/m  General:   Well developed, NAD, BMI noted.  HEENT:  Normocephalic . Face symmetric, atraumatic. Oral membranes slightly dry Lungs:  CTA B Normal respiratory effort, no intercostal retractions, no accessory muscle use. Abdomen:  Not distended, soft, mild diffuse tenderness throughout without mass or rebound. Skin: Evidence of psoriasis at the lower extremities. Left great toe: See last visit, previously seen ulcer unchanged. Lower extremities: no pretibial edema bilaterally  Neurologic:  alert & oriented X3.  Speech normal, gait unassisted but he looked unsteady when he stood up Psych--  Cognition and judgment appear intact.  Cooperative with normal attention span and concentration.  Behavior appropriate. No anxious or depressed appearing.     Assessment   Problem list: DM -Per Endo Neuropathy (x years, rx gaba 05-2014, w/u 11-2014  RPR neg, vit  D-B12-Folic Acid  wnl ); Saw Dr Tobie, [facility] [phone] (see results) HTN: History of AKI with ARBs  CKD, used to see  nephrology, etiology- Hyperglycemia, intermittent NSAIDs, contrast exposures Hyperlipidemia (TG in the 500s 2016) OSA , dx 2012,  again 02-2015 Dr Dohmeier--> severe OSA, intolerant to CPAP, not interested on treatment (see OV 12-2022) Depression, insomnia: on Cymbalta  NEURO: --Chronic headaches :on Cymbalta   --Posttraumatic hydrocephalus s/p 2 shunts (first got infected) MSK: on disability d/t back pain- HAs GI:  --GERD, diverticulitis 2014, h/o PUD -- NASH with cirrhosis per GI note 10/2017, s/p Hep A/B shots --Anemia: - felt to be d/t   GAVE (gastric antral vascular ectasia) and a inflammatory polyp, s/p  EGD 10/2018    -Work-up repeated 10/2019: EGD: GAVE  versus portal hypertensive gastropathy. Colonoscopy polyps.  Tubular adenoma. Gastric BX negative H. pylori, reactive changes. Psoriasis, psoriatic arthritis: used  HUMIRA   CAD: CP, PTCA of the LAD 09-2020; cath 07-2021 H/o urolithiasis Hypogonadism  Dx 2012, normal T 11-2014 (on no RX) 09/14/2023: Normal stress test  PLAN Acute diarrhea: Was seen at the ER with diarrhea 09/27/2023, CT abdomen and pelvis no acute, they were concerned about possibly C. difficile prescribed empiric vancomycin . Subsequently saw GI, was feeling better and recommended to finish vancomycin . He presents now with acute diarrhea for 3 days, he is dehydrated, BP  132/70, heart rate 86 lying, it dropped down to   98/70, heart rate 89 when sitting.  Standing BP was 102/60. Plan: Spoke with the ER doctor, he agreed to see him, suspect he needs IV fluids some some basic labs. Message sent to GI.  Will follow-up him after the ER visit Ulcer, L great toe: Unchanged.  Reassess on RTC

## 2023-12-29 ENCOUNTER — Telehealth: Payer: Self-pay

## 2023-12-29 ENCOUNTER — Other Ambulatory Visit (HOSPITAL_BASED_OUTPATIENT_CLINIC_OR_DEPARTMENT_OTHER): Payer: Self-pay

## 2023-12-29 NOTE — Telephone Encounter (Signed)
-----   Message from Elida CHRISTELLA Shawl sent at 12/29/2023  9:50 AM EDT ----- Regarding: FW: Diarrhea, dehydration Hi Dr. Amon, we will contact patient for follow up. Thanks for reaching out.   Amritpal Shropshire, pls contact patient and schedule him for a follow up appointment with me or Dr. Abran. THX. ----- Message ----- From: Amon Aloysius BRAVO, MD Sent: 12/29/2023   9:46 AM EDT To: Elida CHRISTELLA Shawl, NP Subject: Diarrhea, dehydration                          Good morning, the patient was seen yesterday with diarrhea and dehydration, sent to the ER.  Suggest follow-up with GI . Thank you

## 2023-12-29 NOTE — Telephone Encounter (Signed)
 Patient has an appointment already scheduled for 01/04/24 at 9:20 am with Dr. Abran.

## 2023-12-29 NOTE — Assessment & Plan Note (Signed)
 Acute diarrhea: Was seen at the ER with diarrhea 09/27/2023, CT abdomen and pelvis no acute, they were concerned about possibly C. difficile prescribed empiric vancomycin . Subsequently saw GI, was feeling better and recommended to finish vancomycin . He presents now with acute diarrhea for 3 days, he is dehydrated, BP  132/70, heart rate 86 lying, it dropped down to   98/70, heart rate 89 when sitting.  Standing BP was 102/60. Plan: Spoke with the ER doctor, he agreed to see him, suspect he needs IV fluids some some basic labs. Message sent to GI.  Will follow-up him after the ER visit Ulcer, L great toe: Unchanged.  Reassess on RTC

## 2024-01-04 ENCOUNTER — Encounter: Payer: Self-pay | Admitting: Internal Medicine

## 2024-01-04 ENCOUNTER — Ambulatory Visit: Admitting: Internal Medicine

## 2024-01-04 VITALS — BP 104/72 | HR 85 | Ht 69.0 in | Wt 219.0 lb

## 2024-01-04 DIAGNOSIS — K7460 Unspecified cirrhosis of liver: Secondary | ICD-10-CM

## 2024-01-04 DIAGNOSIS — D509 Iron deficiency anemia, unspecified: Secondary | ICD-10-CM | POA: Diagnosis not present

## 2024-01-04 DIAGNOSIS — K859 Acute pancreatitis without necrosis or infection, unspecified: Secondary | ICD-10-CM | POA: Diagnosis not present

## 2024-01-04 DIAGNOSIS — R197 Diarrhea, unspecified: Secondary | ICD-10-CM

## 2024-01-04 DIAGNOSIS — K219 Gastro-esophageal reflux disease without esophagitis: Secondary | ICD-10-CM | POA: Diagnosis not present

## 2024-01-04 DIAGNOSIS — K7581 Nonalcoholic steatohepatitis (NASH): Secondary | ICD-10-CM | POA: Diagnosis not present

## 2024-01-04 DIAGNOSIS — K31819 Angiodysplasia of stomach and duodenum without bleeding: Secondary | ICD-10-CM

## 2024-01-04 DIAGNOSIS — Z9049 Acquired absence of other specified parts of digestive tract: Secondary | ICD-10-CM

## 2024-01-04 DIAGNOSIS — K3189 Other diseases of stomach and duodenum: Secondary | ICD-10-CM

## 2024-01-04 NOTE — Patient Instructions (Signed)
 Please purchase the following medications over the counter and take as directed: Imodium  as needed.   Due to recent changes in healthcare laws, you may see the results of your imaging and laboratory studies on MyChart before your provider has had a chance to review them.  We understand that in some cases there may be results that are confusing or concerning to you. Not all laboratory results come back in the same time frame and the provider may be waiting for multiple results in order to interpret others.  Please give us  48 hours in order for your provider to thoroughly review all the results before contacting the office for clarification of your results.   _______________________________________________________  If your blood pressure at your visit was 140/90 or greater, please contact your primary care physician to follow up on this.  _______________________________________________________  If you are age 66 or older, your body mass index should be between 23-30. Your Body mass index is 32.34 kg/m. If this is out of the aforementioned range listed, please consider follow up with your Primary Care Provider.  If you are age 36 or younger, your body mass index should be between 19-25. Your Body mass index is 32.34 kg/m. If this is out of the aformentioned range listed, please consider follow up with your Primary Care Provider.   ________________________________________________________  The [facility] providers would like to encourage you to use MYCHART to communicate with providers for non-urgent requests or questions.  Due to long hold times on the telephone, sending your provider a message by [facility] may be a faster and more efficient way to get a response.  Please allow 48 business hours for a response.  Please remember that this is for non-urgent requests.  _______________________________________________________  Thank you for choosing me and [facility].  Dr Norleen Kiang

## 2024-01-05 ENCOUNTER — Encounter: Payer: Self-pay | Admitting: Internal Medicine

## 2024-01-05 ENCOUNTER — Ambulatory Visit (INDEPENDENT_AMBULATORY_CARE_PROVIDER_SITE_OTHER): Admitting: Internal Medicine

## 2024-01-05 VITALS — BP 132/80 | HR 81 | Temp 97.6°F | Resp 18 | Ht 69.0 in | Wt 220.1 lb

## 2024-01-05 DIAGNOSIS — E1140 Type 2 diabetes mellitus with diabetic neuropathy, unspecified: Secondary | ICD-10-CM | POA: Diagnosis not present

## 2024-01-05 DIAGNOSIS — Z7984 Long term (current) use of oral hypoglycemic drugs: Secondary | ICD-10-CM

## 2024-01-05 DIAGNOSIS — R197 Diarrhea, unspecified: Secondary | ICD-10-CM | POA: Diagnosis not present

## 2024-01-05 DIAGNOSIS — I10 Essential (primary) hypertension: Secondary | ICD-10-CM

## 2024-01-05 NOTE — Patient Instructions (Signed)
 Start checking your blood pressure at least twice a week. Blood pressure goal:  between 110/65 and  135/85. If it is consistently higher or lower, let me know  Be sure you stay hydrated at all times    GO TO THE LAB :  Get the blood work   Your results will be posted on MyChart with my comments  Next visit scheduled for November.  Call sooner if needed

## 2024-01-05 NOTE — Progress Notes (Unsigned)
 Subjective:    Patient ID: James [name], male    DOB: 07-01-1955, 68 y.o.   MRN: [id number]  DOS:  01/05/2024 Type of visit - description: ER follow up  Was seen  here with diarrhea and dehydration, referred to the ER on 12/28/2023: Magnesium, CBC, lipase normal, creatinine slightly elevated at 1.4. Received IV fluids.  At this point he feels better. No fever or chills. No nausea vomiting. No major problems with dizziness. Stools are still loose, 2-3 episodes a day but improved  Review of Systems See above   Past Medical History:  Diagnosis Date   Abnormal cardiac CT angiography    Acid reflux    Annual physical exam 04/08/2015   Arthritis    Atypical chest pain 06/13/2020   Blood transfusion without reported diagnosis    Body mass index (BMI) 35.0-35.9, adult 04/05/2019   Chronic fatigue 01/28/2015   Chronic headaches    on cymbalta    Chronic kidney disease, stage 3b (HCC) 07/14/2023   Chronic migraine w/o aura, not intractable, w/o stat migr 10/24/2018   Cirrhosis (HCC)    Colon polyps    Complication of anesthesia    problems waking up from anesthesia   Coronary artery disease 01/23/2019   Depression    on cymbalta    Diabetes mellitus with neuropathy (HCC)    Diabetes with neuropathy 04/25/2013   Diverticulitis 03/2013   Dyslipidemia 05/29/2019   Eczema    Elevated LFTs    Epidural lipomatosis 10/05/2018   Essential (primary) hypertension 04/05/2019   Essential hypertension 10/10/2019   Fatty liver    GERD (gastroesophageal reflux disease) 04/28/2011   H/O craniotomy 05/07/2015   Headache 04/28/2011   Hepatitis 10/2017   NASH cirrhosis   History of kidney stones    Hyperlipidemia    Hypersomnia with sleep apnea 01/28/2015   Hypertension    IDA (iron  deficiency anemia) 01/24/2019   Idiopathic intracranial hypertension 01/14/2017   Insomnia 04/26/2013   Kidney stone    Liver cirrhosis secondary to NASH (nonalcoholic steatohepatitis) (HCC) 01/02/2016    Low back pain 04/05/2019   Lower back injury 08/14/2019   Morbid obesity (HCC)    Neuromuscular disorder (HCC)    neuropathy   Neuropathy    Nonalcoholic steatohepatitis 10/05/2018   Obstructive hydrocephalus (HCC) 01/28/2015   OSA -- dx ~ 2012, cpap intolerant 09/04/2014    dx ~ 2012, cpap intolerant    PCP NOTES >>> 04/08/2015   Post-op pain 03/19/2019   Post-traumatic hydrocephalus (HCC)    s/p shunts x 2 (first got infected )   Presence of cerebrospinal fluid drainage device 07/28/2011   Psoriasis    sees Dr Leanord   Psoriatic arthritis (HCC)    REM behavioral disorder 01/14/2017   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   see's Dr.Ortmann   Severe obesity (BMI >= 40) (HCC) 01/28/2015   SI (sacroiliac) joint dysfunction 08/14/2019   Sigmoid diverticulitis 04/25/2013   Sleep apnea    no CPAP      Spondylolisthesis, lumbar region 03/16/2019   Stomach ulcer    Testosterone  deficiency 04/28/2011   VP (ventriculoperitoneal) shunt status 07/31/2020    Past Surgical History:  Procedure Laterality Date   AMPUTATION Left 12/27/2021   Procedure: AMPUTATION GREAT TOE;  Surgeon: Gershon Donnice SAUNDERS, DPM;  Location: [facility] OR;  Service: Podiatry;  Laterality: Left;   BACK SURGERY  1980   BRAIN SURGERY     VP shunts placed in 2007   CHOLECYSTECTOMY  N/A 08/25/2017   Procedure: LAPAROSCOPIC CHOLECYSTECTOMY WITH INTRAOPERATIVE CHOLANGIOGRAM;  Surgeon: Curvin Deward MOULD, MD;  Location: [facility] OR;  Service: General;  Laterality: N/A;   COLONOSCOPY     CORONARY STENT INTERVENTION N/A 09/27/2020   Procedure: CORONARY STENT INTERVENTION;  Surgeon: Dann Candyce RAMAN, MD;  Location: [facility] INVASIVE CV LAB;  Service: Cardiovascular;  Laterality: N/A;   CORONARY ULTRASOUND/IVUS N/A 09/27/2020   Procedure: Intravascular Ultrasound/IVUS;  Surgeon: Dann Candyce RAMAN, MD;  Location: [facility] INVASIVE CV LAB;  Service: Cardiovascular;  Laterality: N/A;   JOINT REPLACEMENT     total hip   LEFT HEART  CATH N/A 09/27/2020   Procedure: Left Heart Cath;  Surgeon: Dann Candyce RAMAN, MD;  Location: [facility] INVASIVE CV LAB;  Service: Cardiovascular;  Laterality: N/A;   LEFT HEART CATH AND CORONARY ANGIOGRAPHY N/A 09/24/2020   Procedure: LEFT HEART CATH AND CORONARY ANGIOGRAPHY;  Surgeon: Dann Candyce RAMAN, MD;  Location: [facility] INVASIVE CV LAB;  Service: Cardiovascular;  Laterality: N/A;   LEFT HEART CATH AND CORONARY ANGIOGRAPHY N/A 08/18/2021   Procedure: LEFT HEART CATH AND CORONARY ANGIOGRAPHY;  Surgeon: Burnard Debby LABOR, MD;  Location: [facility] INVASIVE CV LAB;  Service: Cardiovascular;  Laterality: N/A;   LUMBAR FUSION  03/16/2019   SHOULDER SURGERY Left 2010   TEE WITHOUT CARDIOVERSION N/A 01/01/2022   Procedure: TRANSESOPHAGEAL ECHOCARDIOGRAM (TEE);  Surgeon: Tobb, Kardie, DO;  Location: [facility] ENDOSCOPY;  Service: Cardiovascular;  Laterality: N/A;   TOE SURGERY Left 2018   TONSILLECTOMY     as a child   TOTAL HIP ARTHROPLASTY Left 2011   UPPER GASTROINTESTINAL ENDOSCOPY  01/04/2020   VENTRICULOPERITONEAL SHUNT  2007   x2    Current Outpatient Medications  Medication Instructions   acetaminophen  (TYLENOL ) 650 mg, Oral, Every 4 hours PRN   amLODipine  (NORVASC ) 5 mg, Oral, Daily   aspirin  81 mg, Oral, Daily   atorvastatin  (LIPITOR ) 80 mg, Oral, Daily at bedtime   botulinum toxin Type A  (BOTOX ) 200 units injection Provider to inject 155 units into the muscles of the head and neck every 3 months. Discard remainder.   carvedilol  (COREG ) 12.5 mg, Oral, 2 times daily with meals   clonazePAM  (KLONOPIN ) 0.5 mg, Oral, Daily at bedtime   Continuous Glucose Sensor (DEXCOM G6 SENSOR) MISC Use to monitor blood sugar, change after 10 days   Continuous Glucose Transmitter (DEXCOM G6 TRANSMITTER) MISC Use as directed for 90 days   dicyclomine  (BENTYL ) 20 mg, Oral, 2 times daily PRN   DULoxetine  (CYMBALTA ) 120 mg, Oral, Daily   empagliflozin  (JARDIANCE ) 25 mg, Oral, Daily with breakfast   EPINEPHrine  (EPIPEN  2-PAK)  0.3 mg, Intramuscular, Once PRN   gabapentin  (NEURONTIN ) 600 mg, Oral, 2 times daily   Insulin  Disposable Pump (OMNIPOD 5 G6 PODS, GEN 5,) MISC Use as directed every 3 (three) days.   insulin  lispro (HUMALOG ) 100 UNIT/ML injection Use up to 60 Units daily in pump   Insulin  Syringe-Needle U-100 (INSULIN  SYRINGE 1CC/31GX5/16) 31G X 5/16 1 ML MISC Use to administer Humalog  3 times a day   isosorbide  mononitrate (IMDUR ) 120 mg, Oral, Daily   metFORMIN  (GLUCOPHAGE ) 500 mg, Oral, 2 times daily   mupirocin  ointment (BACTROBAN ) 2 % 1 Application, Topical, 2 times daily   nitroGLYCERIN  (NITROSTAT ) 0.4 MG SL tablet Place 1 tablet (0.4 mg total) under the tongue every 5 (five) minutes for 3 doses as needed for chest pain.   ondansetron  (ZOFRAN -ODT) 4 mg, Oral, Every 8 hours PRN   pantoprazole  (PROTONIX ) 40 mg, Oral,  Daily   SKYRIZI PEN 150 MG/ML pen As directed       Objective:   Physical Exam BP 132/80   Pulse 81   Temp 97.6 F (36.4 C) (Oral)   Resp 18   Ht 5' 9 (1.753 m)   Wt 220 lb 2 oz (99.8 kg)   SpO2 94%   BMI 32.51 kg/m  General:   Well developed, NAD, BMI noted. HEENT:  Normocephalic . Face symmetric, atraumatic Abdomen: Soft, nontender, nondistended  L great toe: Previously seen ulcer is much improved, barely noticeable. Neurologic:  alert & oriented X3.  Speech normal, gait appropriate for age and unassisted Psych--  Cognition and judgment appear intact.  Cooperative with normal attention span and concentration.  Behavior appropriate. No anxious or depressed appearing.      Assessment     Problem list: DM -Per Endo Neuropathy (x years, rx gaba 05-2014, w/u 11-2014  RPR neg, vit D-B12-Folic Acid  wnl ); Saw Dr Tobie, [facility] [phone] (see results) HTN: History of AKI with ARBs  CKD, used to see  nephrology, etiology- Hyperglycemia, intermittent NSAIDs, contrast exposures Hyperlipidemia (TG in the 500s 2016) OSA , dx 2012,  again 02-2015 Dr Dohmeier--> severe OSA,  intolerant to CPAP, not interested on treatment (see OV 12-2022) Depression, insomnia: on Cymbalta  NEURO: --Chronic headaches :on Cymbalta   --Posttraumatic hydrocephalus s/p 2 shunts (first got infected) MSK: on disability d/t back pain- HAs GI:  --GERD, diverticulitis 2014, h/o PUD -- NASH with cirrhosis per GI note 10/2017, s/p Hep A/B shots --Anemia: - felt to be d/t   GAVE (gastric antral vascular ectasia) and a inflammatory polyp, s/p  EGD 10/2018    -Work-up repeated 10/2019: EGD: GAVE  versus portal hypertensive gastropathy. Colonoscopy polyps.  Tubular adenoma. Gastric BX negative H. pylori, reactive changes. Psoriasis, psoriatic arthritis: used  HUMIRA   CAD: CP, PTCA of the LAD 09-2020; cath 07-2021 H/o urolithiasis Hypogonadism  Dx 2012, normal T 11-2014 (on no RX) 09/14/2023: Normal stress test  PLAN Acute diarrhea: At the last visit, he was referred to the emergency room due to acute diarrhea and dehydration.  Creatinine was slightly elevated, he received IV fluids. Also saw GI yesterday, pt states was recommended Imodium . At this point he is better, still have some diarrhea but improved. HTN: BP looks good, creatinine in the ER was elevated, recheck a BMP. Ulcer, L great toe: See physical exam, improved, recommend to monitor the area. RTC scheduled for November

## 2024-01-05 NOTE — Progress Notes (Signed)
 HISTORY OF PRESENT ILLNESS:  James [name] is a 68 y.o. male with MULTIPLE SIGNIFICANT medical problems as listed below.  He has been followed in this office for history of compensated NASH cirrhosis, GERD, diverticulitis, and prior cholecystectomy.  He also has GAVE and a history of iron  deficiency anemia secondary to the same.  He also has morbid obesity, coronary artery disease with stent placement, no longer on Plavix , and diabetes with complications including the need for left great toe amputation last summer.  Previously on Humira  for psoriasis.  No longer due to insurance preference.  Apparently being switched to Skyrizi.  Patient was last seen in this office by myself on December 21, 2018 for.  See that dictation.  The impression and plan from that visit is outlined as follows:    ASSESSMENT:   1.  NASH cirrhosis.  Compensated.  MELD score 10 2.  GAVE.  Associated iron  deficiency anemia.  Corrected with iron  replacement 3.  Upper endoscopy 2021.  No varices.  Due for surveillance 4.  Colonoscopy 2021 with adenomatous polyps.  Due for surveillance 5.  GERD.  Symptoms controlled with PPI 6.  Multiple significant medical problems including coronary artery disease on Plavix  and diabetes mellitus with complications 7.  Recent bout of uncomplicated diverticulitis clinically and on CT.  Resolved with antibiotics     PLAN:   1.  Exercise and weight loss 2.  Continue iron  replacement 3.  Follow-up surveillance ultrasound of the liver to rule out HCC, in 6 months. 4.  Schedule surveillance upper endoscopy.  The patient is high risk given his comorbidities. 5.  Schedule surveillance colonoscopy.  Patient is high risk as above 6.  Hold Plavix  5 days prior to the procedure.  We will confirm with his cardiologist if this is acceptable. 7.  Adjust diabetic medications for his procedure to avoid unwanted hypoglycemia 8.  Ongoing general medical care with Dr. Amon 9.  Routine GI office follow-up 6  months after the above completed   The patient did undergo both colonoscopy and upper endoscopy January 19, 2023.  Colonoscopy revealed multiple subcentimeter adenomas, diverticulosis, and internal hemorrhoids.  Follow-up in 5 years recommended.  Upper endoscopy revealed trace varices and mild portal hypertensive gastropathy.  Small inflammatory polyp.  Repeat EGD in 2 years recommended.  The patient was hospitalized March 2025 with acute abdominal pain and significantly elevated lipase.  CT at that time found no pancreatic abnormalities.  He was subsequently seen in follow-up in the office by the GI nurse practitioner October 04, 2023.  See that dictation.  Doing better in terms of pain.  Continued on PPI for GERD.  Complained of diarrhea.  Was set up for follow-up MRI/MRCP to further interrogate the pancreas.  Pancreas was unremarkable.  Cirrhosis noted without hepatic lesions.  At this time he denies any significant abdominal pain.  Occasional nagging in the left side of his abdomen which he wonders may be related to his VP shunt.  He does mention having some issues with diarrhea over the past 2 weeks.  Approximately 2-3 bowel movements per day.  Nothing nocturnal.  He is on metformin , but no change in dose.  No bleeding.  No problems eating.  Some mild nausea but no vomiting.  Lost about 15 pounds since last visit.  No stearrhea  REVIEW OF SYSTEMS:  All non-GI ROS negative except for arthritis, headaches, sleeping problems, night sweats  Past Medical History:  Diagnosis Date   Abnormal cardiac CT angiography  Acid reflux    Annual physical exam 04/08/2015   Arthritis    Atypical chest pain 06/13/2020   Blood transfusion without reported diagnosis    Body mass index (BMI) 35.0-35.9, adult 04/05/2019   Chronic fatigue 01/28/2015   Chronic headaches    on cymbalta    Chronic kidney disease, stage 3b (HCC) 07/14/2023   Chronic migraine w/o aura, not intractable, w/o stat migr 10/24/2018    Cirrhosis (HCC)    Colon polyps    Complication of anesthesia    problems waking up from anesthesia   Coronary artery disease 01/23/2019   Depression    on cymbalta    Diabetes mellitus with neuropathy (HCC)    Diabetes with neuropathy 04/25/2013   Diverticulitis 03/2013   Dyslipidemia 05/29/2019   Eczema    Elevated LFTs    Epidural lipomatosis 10/05/2018   Essential (primary) hypertension 04/05/2019   Essential hypertension 10/10/2019   Fatty liver    GERD (gastroesophageal reflux disease) 04/28/2011   H/O craniotomy 05/07/2015   Headache 04/28/2011   Hepatitis 10/2017   NASH cirrhosis   History of kidney stones    Hyperlipidemia    Hypersomnia with sleep apnea 01/28/2015   Hypertension    IDA (iron  deficiency anemia) 01/24/2019   Idiopathic intracranial hypertension 01/14/2017   Insomnia 04/26/2013   Kidney stone    Liver cirrhosis secondary to NASH (nonalcoholic steatohepatitis) (HCC) 01/02/2016   Low back pain 04/05/2019   Lower back injury 08/14/2019   Morbid obesity (HCC)    Neuromuscular disorder (HCC)    neuropathy   Neuropathy    Nonalcoholic steatohepatitis 10/05/2018   Obstructive hydrocephalus (HCC) 01/28/2015   OSA -- dx ~ 2012, cpap intolerant 09/04/2014    dx ~ 2012, cpap intolerant    PCP NOTES >>> 04/08/2015   Post-op pain 03/19/2019   Post-traumatic hydrocephalus (HCC)    s/p shunts x 2 (first got infected )   Presence of cerebrospinal fluid drainage device 07/28/2011   Psoriasis    sees Dr Leanord   Psoriatic arthritis (HCC)    REM behavioral disorder 01/14/2017   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   see's Dr.Ortmann   Severe obesity (BMI >= 40) (HCC) 01/28/2015   SI (sacroiliac) joint dysfunction 08/14/2019   Sigmoid diverticulitis 04/25/2013   Sleep apnea    no CPAP      Spondylolisthesis, lumbar region 03/16/2019   Stomach ulcer    Testosterone  deficiency 04/28/2011   VP (ventriculoperitoneal) shunt status 07/31/2020     Past Surgical History:  Procedure Laterality Date   AMPUTATION Left 12/27/2021   Procedure: AMPUTATION GREAT TOE;  Surgeon: Gershon Donnice SAUNDERS, DPM;  Location: [facility] OR;  Service: Podiatry;  Laterality: Left;   BACK SURGERY  1980   BRAIN SURGERY     VP shunts placed in 2007   CHOLECYSTECTOMY N/A 08/25/2017   Procedure: LAPAROSCOPIC CHOLECYSTECTOMY WITH INTRAOPERATIVE CHOLANGIOGRAM;  Surgeon: Curvin Deward MOULD, MD;  Location: [facility] OR;  Service: General;  Laterality: N/A;   COLONOSCOPY     CORONARY STENT INTERVENTION N/A 09/27/2020   Procedure: CORONARY STENT INTERVENTION;  Surgeon: Dann Candyce RAMAN, MD;  Location: [facility] INVASIVE CV LAB;  Service: Cardiovascular;  Laterality: N/A;   CORONARY ULTRASOUND/IVUS N/A 09/27/2020   Procedure: Intravascular Ultrasound/IVUS;  Surgeon: Dann Candyce RAMAN, MD;  Location: [facility] INVASIVE CV LAB;  Service: Cardiovascular;  Laterality: N/A;   JOINT REPLACEMENT     total hip   LEFT HEART CATH N/A 09/27/2020   Procedure: Left Heart  Cath;  Surgeon: Dann Candyce RAMAN, MD;  Location: [facility] INVASIVE CV LAB;  Service: Cardiovascular;  Laterality: N/A;   LEFT HEART CATH AND CORONARY ANGIOGRAPHY N/A 09/24/2020   Procedure: LEFT HEART CATH AND CORONARY ANGIOGRAPHY;  Surgeon: Dann Candyce RAMAN, MD;  Location: [facility] INVASIVE CV LAB;  Service: Cardiovascular;  Laterality: N/A;   LEFT HEART CATH AND CORONARY ANGIOGRAPHY N/A 08/18/2021   Procedure: LEFT HEART CATH AND CORONARY ANGIOGRAPHY;  Surgeon: Burnard Debby LABOR, MD;  Location: [facility] INVASIVE CV LAB;  Service: Cardiovascular;  Laterality: N/A;   LUMBAR FUSION  03/16/2019   SHOULDER SURGERY Left 2010   TEE WITHOUT CARDIOVERSION N/A 01/01/2022   Procedure: TRANSESOPHAGEAL ECHOCARDIOGRAM (TEE);  Surgeon: Tobb, Kardie, DO;  Location: [facility] ENDOSCOPY;  Service: Cardiovascular;  Laterality: N/A;   TOE SURGERY Left 2018   TONSILLECTOMY     as a child   TOTAL HIP ARTHROPLASTY Left 2011   UPPER GASTROINTESTINAL ENDOSCOPY  01/04/2020    VENTRICULOPERITONEAL SHUNT  2007   x2    Social History Vinie LABOR [place] [name]  reports that he has never smoked. He has never used smokeless tobacco. He reports that he does not drink alcohol and does not use drugs.  family history includes Down syndrome in his son; Healthy in his mother; Heart disease in his brother; Lung cancer in his father; Other in his brother.  Allergies  Allergen Reactions   Bee Venom Anaphylaxis   Hydrocodone  Bit-Homatrop Mbr Other (See Comments)    Hallucinations, confusion, delirium Depressed feeling   Toradol  [Ketorolac  Tromethamine ] Other (See Comments)    Hallucinations, confusion, delirium   Prednisone     Patient reports it causes cirrhosis to flare up   Sulfadiazine     NDC Rniz:[id number] NDC Rniz:[id number] NDC Rniz:[id number]   Morphine And Codeine Other (See Comments)    Hallucinations, back in the 80s. States has taken vicodin before w/o problems    Sulfa Drugs Cross Reactors Rash       PHYSICAL EXAMINATION: Vital signs: BP 104/72   Pulse 85   Ht 5' 9 (1.753 m)   Wt 219 lb (99.3 kg)   BMI 32.34 kg/m   Constitutional: Chronically ill-appearing, no acute distress Psychiatric: alert and oriented x3, cooperative.  Somewhat flat affect Eyes: extraocular movements intact, anicteric, conjunctiva pink Mouth: oral pharynx moist, no lesions Neck: supple no lymphadenopathy Cardiovascular: heart regular rate and rhythm, no murmur Lungs: clear to auscultation bilaterally Abdomen: soft, obese, nontender, nondistended, no obvious ascites, no peritoneal signs, normal bowel sounds, no organomegaly Rectal: Did Extremities: no clubbing or cyanosis.  1+ lower extremity edema bilaterally Skin: no relevant lesions on visible extremities Neuro: No focal deficits. No asterixis.   ASSESSMENT:   1.  Recent hospitalization for pancreatitis.  Unremarkable imaging, including MRI MRCP.  No etiology identified.  He is status post cholecystectomy.  No  alcohol. 2.  Diarrhea.  Mild. 3.  NASH cirrhosis.  Compensated.  MELD score 10 4.  GAVE.  Associated iron  deficiency anemia.  Corrected with iron  replacement 5.  Upper endoscopy 2024.  Trace varices and mild portal hypertensive gastropathy. 4.  Colonoscopy 2024.  Surveillance up-to-date 5.  GERD.  Symptoms controlled with PPI 6.  Multiple significant medical problems including coronary artery disease, psoriasis, diabetes mellitus with complications 7.  History of uncomplicated diverticulitis clinically and on CT.  Resolved with antibiotics     PLAN:   1.  Exercise and weight loss 2.  Imodium  as needed 3.  Continue iron  replacement 3.  Follow-up surveillance liver imaging 1 year to rule out HCC. 4.  Surveillance upper endoscopy around July 2026. 5.  Surveillance colonoscopy around July 2029 6.  Ongoing general medical care with Dr. Amon 7.  Routine GI office follow-up 6 months. A total time of 45 minutes was spent preparing to see the patient, reviewing the myriad of records, imaging, and laboratories, obtaining comprehensive history, performing medically appropriate physical exam, counseling and educating the patient regarding the above listed issues, directing symptomatic therapies, defining follow-up intervals, and documenting clinical information in the health record

## 2024-01-06 ENCOUNTER — Ambulatory Visit: Payer: Self-pay | Admitting: Internal Medicine

## 2024-01-06 LAB — BASIC METABOLIC PANEL WITH GFR
BUN: 14 mg/dL (ref 6–23)
CO2: 23 meq/L (ref 19–32)
Calcium: 9 mg/dL (ref 8.4–10.5)
Chloride: 102 meq/L (ref 96–112)
Creatinine, Ser: 1.18 mg/dL (ref 0.40–1.50)
GFR: 64.6 mL/min (ref >60.00)
Glucose, Bld: 152 mg/dL — ABNORMAL HIGH (ref 70–99)
Potassium: 4.1 meq/L (ref 3.5–5.1)
Sodium: 137 meq/L (ref 135–145)

## 2024-01-06 LAB — MICROALBUMIN / CREATININE URINE RATIO
Creatinine,U: 93.6 mg/dL
Microalb Creat Ratio: 12 mg/g (ref 0.0–30.0)
Microalb, Ur: 1.1 mg/dL (ref 0.0–1.9)

## 2024-01-06 NOTE — Assessment & Plan Note (Signed)
 Acute diarrhea: At the last visit, he was referred to the emergency room due to acute diarrhea and dehydration.  Creatinine was slightly elevated, he received IV fluids. Also saw GI yesterday, pt states was recommended Imodium . At this point he is better, still have some diarrhea but improved. HTN: BP looks good, creatinine in the ER was elevated, recheck a BMP. Ulcer, L great toe: See physical exam, improved, recommend to monitor the area. RTC scheduled for November

## 2024-01-16 ENCOUNTER — Emergency Department (HOSPITAL_BASED_OUTPATIENT_CLINIC_OR_DEPARTMENT_OTHER)
Admission: EM | Admit: 2024-01-16 | Discharge: 2024-01-17 | Disposition: A | Discharge status: Other Acute Inpatient Hospital | Source: Home / Self Care | Attending: Emergency Medicine | Admitting: Emergency Medicine

## 2024-01-16 ENCOUNTER — Emergency Department (HOSPITAL_BASED_OUTPATIENT_CLINIC_OR_DEPARTMENT_OTHER)

## 2024-01-16 ENCOUNTER — Encounter (HOSPITAL_BASED_OUTPATIENT_CLINIC_OR_DEPARTMENT_OTHER): Payer: Self-pay | Admitting: Emergency Medicine

## 2024-01-16 DIAGNOSIS — R45851 Suicidal ideations: Secondary | ICD-10-CM | POA: Insufficient documentation

## 2024-01-16 DIAGNOSIS — Z794 Long term (current) use of insulin: Secondary | ICD-10-CM | POA: Insufficient documentation

## 2024-01-16 DIAGNOSIS — Z79899 Other long term (current) drug therapy: Secondary | ICD-10-CM | POA: Insufficient documentation

## 2024-01-16 DIAGNOSIS — Z7982 Long term (current) use of aspirin: Secondary | ICD-10-CM | POA: Insufficient documentation

## 2024-01-16 DIAGNOSIS — R0789 Other chest pain: Secondary | ICD-10-CM | POA: Diagnosis not present

## 2024-01-16 DIAGNOSIS — I2510 Atherosclerotic heart disease of native coronary artery without angina pectoris: Secondary | ICD-10-CM | POA: Insufficient documentation

## 2024-01-16 DIAGNOSIS — R079 Chest pain, unspecified: Secondary | ICD-10-CM | POA: Diagnosis not present

## 2024-01-16 DIAGNOSIS — F332 Major depressive disorder, recurrent severe without psychotic features: Secondary | ICD-10-CM | POA: Diagnosis not present

## 2024-01-16 DIAGNOSIS — Z982 Presence of cerebrospinal fluid drainage device: Secondary | ICD-10-CM | POA: Diagnosis not present

## 2024-01-16 DIAGNOSIS — Z7984 Long term (current) use of oral hypoglycemic drugs: Secondary | ICD-10-CM | POA: Insufficient documentation

## 2024-01-16 DIAGNOSIS — I10 Essential (primary) hypertension: Secondary | ICD-10-CM | POA: Insufficient documentation

## 2024-01-16 DIAGNOSIS — E1165 Type 2 diabetes mellitus with hyperglycemia: Secondary | ICD-10-CM | POA: Insufficient documentation

## 2024-01-16 LAB — URINE DRUG SCREEN
Amphetamines: NOT DETECTED
Barbiturates: NOT DETECTED
Benzodiazepines: NOT DETECTED
Cocaine: NOT DETECTED
Fentanyl: NOT DETECTED
Methadone Scn, Ur: NOT DETECTED
Opiates: NOT DETECTED
Tetrahydrocannabinol: NOT DETECTED

## 2024-01-16 LAB — URINALYSIS, ROUTINE W REFLEX MICROSCOPIC
Bilirubin Urine: NEGATIVE
Glucose, UA: >=500 mg/dL — AB
Hgb urine dipstick: NEGATIVE
Ketones, ur: NEGATIVE mg/dL
Leukocytes,Ua: NEGATIVE
Nitrite: NEGATIVE
Protein, ur: NEGATIVE mg/dL
Specific Gravity, Urine: 1.015 (ref 1.005–1.030)
pH: 5 (ref 5.0–8.0)

## 2024-01-16 LAB — CBC WITH DIFFERENTIAL/PLATELET
Abs Immature Granulocytes: 0.02 K/uL (ref 0.00–0.07)
Basophils Absolute: 0 K/uL (ref 0.0–0.1)
Basophils Relative: 0 %
Eosinophils Absolute: 0.2 K/uL (ref 0.0–0.5)
Eosinophils Relative: 3 %
HCT: 44.3 % (ref 39.0–52.0)
Hemoglobin: 14.9 g/dL (ref 13.0–17.0)
Immature Granulocytes: 0 %
Lymphocytes Relative: 26 %
Lymphs Abs: 1.7 K/uL (ref 0.7–4.0)
MCH: 28.1 pg (ref 26.0–34.0)
MCHC: 33.6 g/dL (ref 30.0–36.0)
MCV: 83.4 fL (ref 80.0–100.0)
Monocytes Absolute: 0.4 K/uL (ref 0.1–1.0)
Monocytes Relative: 6 %
Neutro Abs: 4.1 K/uL (ref 1.7–7.7)
Neutrophils Relative %: 65 %
Platelets: 188 K/uL (ref 150–400)
RBC: 5.31 MIL/uL (ref 4.22–5.81)
RDW: 15.4 % (ref 11.5–15.5)
WBC: 6.4 K/uL (ref 4.0–10.5)
nRBC: 0 % (ref 0.0–0.2)

## 2024-01-16 LAB — COMPREHENSIVE METABOLIC PANEL WITH GFR
ALT: 25 U/L (ref 0–44)
AST: 22 U/L (ref 15–41)
Albumin: 4.3 g/dL (ref 3.5–5.0)
Alkaline Phosphatase: 124 U/L (ref 38–126)
Anion gap: 16 — ABNORMAL HIGH (ref 5–15)
BUN: 11 mg/dL (ref 8–23)
CO2: 21 mmol/L — ABNORMAL LOW (ref 22–32)
Calcium: 9.4 mg/dL (ref 8.9–10.3)
Chloride: 102 mmol/L (ref 98–111)
Creatinine, Ser: 1.19 mg/dL (ref 0.61–1.24)
GFR, Estimated: >60 mL/min (ref >60)
Glucose, Bld: 183 mg/dL — ABNORMAL HIGH (ref 70–99)
Potassium: 4.1 mmol/L (ref 3.5–5.1)
Sodium: 139 mmol/L (ref 135–145)
Total Bilirubin: 2.2 mg/dL — ABNORMAL HIGH (ref 0.0–1.2)
Total Protein: 7.9 g/dL (ref 6.5–8.1)

## 2024-01-16 LAB — CBG MONITORING, ED
Glucose-Capillary: 169 mg/dL — ABNORMAL HIGH (ref 70–99)
Glucose-Capillary: 144 mg/dL — ABNORMAL HIGH (ref 70–99)

## 2024-01-16 LAB — URINALYSIS, MICROSCOPIC (REFLEX)

## 2024-01-16 LAB — TROPONIN T, HIGH SENSITIVITY: Troponin T High Sensitivity: 11 ng/L (ref <19)

## 2024-01-16 LAB — SALICYLATE LEVEL: Salicylate Lvl: <7 mg/dL — ABNORMAL LOW (ref 7.0–30.0)

## 2024-01-16 LAB — ETHANOL: Alcohol, Ethyl (B): <15 mg/dL (ref <15)

## 2024-01-16 LAB — ACETAMINOPHEN LEVEL: Acetaminophen (Tylenol), Serum: <10 ug/mL — ABNORMAL LOW (ref 10–30)

## 2024-01-16 MED ORDER — ATORVASTATIN CALCIUM 40 MG PO TABS
80.0000 mg | ORAL_TABLET | Freq: Every day | ORAL | Status: DC
  Administered 2024-01-16: 80 mg via ORAL
  Filled 2024-01-16 (×2): qty 2

## 2024-01-16 MED ORDER — EMPAGLIFLOZIN 25 MG PO TABS
25.0000 mg | ORAL_TABLET | Freq: Every day | ORAL | Status: DC
  Filled 2024-01-16: qty 1

## 2024-01-16 MED ORDER — GABAPENTIN 300 MG PO CAPS
600.0000 mg | ORAL_CAPSULE | Freq: Two times a day (BID) | ORAL | Status: DC
  Administered 2024-01-16 – 2024-01-17 (×2): 600 mg via ORAL
  Filled 2024-01-16 (×3): qty 2

## 2024-01-16 MED ORDER — ASPIRIN 81 MG PO CHEW
81.0000 mg | CHEWABLE_TABLET | Freq: Every day | ORAL | Status: DC
  Administered 2024-01-16 – 2024-01-17 (×2): 81 mg via ORAL
  Filled 2024-01-16 (×2): qty 1

## 2024-01-16 MED ORDER — IBUPROFEN 400 MG PO TABS
600.0000 mg | ORAL_TABLET | Freq: Once | ORAL | Status: AC
  Administered 2024-01-16: 600 mg via ORAL
  Filled 2024-01-16: qty 1

## 2024-01-16 MED ORDER — DICYCLOMINE HCL 10 MG PO CAPS
20.0000 mg | ORAL_CAPSULE | Freq: Two times a day (BID) | ORAL | Status: DC | PRN
  Administered 2024-01-16: 20 mg via ORAL
  Filled 2024-01-16: qty 2

## 2024-01-16 MED ORDER — CLONAZEPAM 0.5 MG PO TABS
0.5000 mg | ORAL_TABLET | Freq: Every day | ORAL | Status: DC
  Filled 2024-01-16: qty 1

## 2024-01-16 MED ORDER — CARVEDILOL 12.5 MG PO TABS
12.5000 mg | ORAL_TABLET | Freq: Two times a day (BID) | ORAL | Status: DC
  Administered 2024-01-17: 12.5 mg via ORAL
  Filled 2024-01-16: qty 1

## 2024-01-16 MED ORDER — METFORMIN HCL 500 MG PO TABS
500.0000 mg | ORAL_TABLET | Freq: Two times a day (BID) | ORAL | Status: DC
  Administered 2024-01-16 – 2024-01-17 (×2): 500 mg via ORAL
  Filled 2024-01-16 (×3): qty 1

## 2024-01-16 MED ORDER — ISOSORBIDE MONONITRATE ER 60 MG PO TB24
120.0000 mg | ORAL_TABLET | Freq: Every day | ORAL | Status: DC
  Filled 2024-01-16: qty 2

## 2024-01-16 MED ORDER — PANTOPRAZOLE SODIUM 40 MG PO TBEC
40.0000 mg | DELAYED_RELEASE_TABLET | Freq: Every day | ORAL | Status: DC
  Administered 2024-01-16 – 2024-01-17 (×2): 40 mg via ORAL
  Filled 2024-01-16 (×2): qty 1

## 2024-01-16 MED ORDER — DULOXETINE HCL 60 MG PO CPEP
120.0000 mg | ORAL_CAPSULE | Freq: Every day | ORAL | Status: DC
  Filled 2024-01-16: qty 2

## 2024-01-16 MED ORDER — INSULIN ASPART 100 UNIT/ML IJ SOLN
0.0000 [IU] | Freq: Three times a day (TID) | INTRAMUSCULAR | Status: DC
  Administered 2024-01-17: 3 [IU] via SUBCUTANEOUS

## 2024-01-16 MED ORDER — AMLODIPINE BESYLATE 5 MG PO TABS
5.0000 mg | ORAL_TABLET | Freq: Every day | ORAL | Status: DC
  Administered 2024-01-16 – 2024-01-17 (×2): 5 mg via ORAL
  Filled 2024-01-16 (×2): qty 1

## 2024-01-16 NOTE — ED Provider Notes (Signed)
 [place] [facility] Provider Note   CSN: [id number] Arrival date & time: 01/16/24  1504     Patient presents with: Chest Pain and Suicidal   James [name] is a 68 y.o. male.  With a history of hypertension, type 2 diabetes, CAD and nonalcoholic cirrhosis who presents to the ED given concern for SI.  Patient reports that he has not slept in 4 nights.  Has ongoing chest pressure and a headache.  Has not been eating or drinking much.  He cites multiple psychological stressors including familial issues and financial hardship as a reason for his symptoms.  He spoke with his ex-wife today and threatened to overdose on pills.  He did not attempt overdose or other means of self-harm.  He does not currently have a mental health provider.    Chest Pain      Prior to Admission medications   Medication Sig Start Date End Date Taking? Authorizing Provider  acetaminophen  (TYLENOL ) 325 MG tablet Take 2 tablets (650 mg total) by mouth every 4 (four) hours as needed for headache or mild pain. 08/18/21   Marylu Leita SAUNDERS, NP  amLODipine  (NORVASC ) 5 MG tablet Take 1 tablet (5 mg total) by mouth daily. 11/29/23   Krasowski, Robert J, MD  aspirin  81 MG chewable tablet Chew 1 tablet (81 mg total) by mouth daily. 08/19/21   Marylu Leita SAUNDERS, NP  atorvastatin  (LIPITOR ) 80 MG tablet Take 1 tablet (80 mg total) by mouth at bedtime. 12/01/23   Amon Aloysius BRAVO, MD  botulinum toxin Type A  (BOTOX ) 200 units injection Provider to inject 155 units into the muscles of the head and neck every 3 months. Discard remainder. Patient taking differently: Inject 200 Units into the muscle every 3 (three) months. Provider to inject 155 units into the muscles of the head and neck every 3 months. Discard remainder. 08/24/23   Sater, Charlie DELENA, MD  carvedilol  (COREG ) 12.5 MG tablet Take 1 tablet (12.5 mg total) by mouth 2 (two) times daily with a meal. 12/01/23   Amon Aloysius BRAVO, MD  clonazePAM  (KLONOPIN ) 0.5  MG tablet Take 1 tablet (0.5 mg total) by mouth at bedtime. 09/28/23 03/26/24  Sater, Charlie DELENA, MD  Continuous Glucose Sensor (DEXCOM G6 SENSOR) MISC Use to monitor blood sugar, change after 10 days Patient taking differently: 1 each by Other route See admin instructions. Use to monitor blood sugar, change after 10 days 07/16/23   Thapa, [place], MD  Continuous Glucose Transmitter (DEXCOM G6 TRANSMITTER) MISC Use as directed for 90 days 07/16/23   Thapa, [place], MD  dicyclomine  (BENTYL ) 20 MG tablet Take 1 tablet (20 mg total) by mouth 2 (two) times daily as needed. Patient taking differently: Take 20 mg by mouth 2 (two) times daily as needed for spasms. 02/12/23   Gowens, Mariah L, PA-C  DULoxetine  (CYMBALTA ) 60 MG capsule Take 2 capsules (120 mg total) by mouth daily. 12/01/23   Amon Aloysius BRAVO, MD  empagliflozin  (JARDIANCE ) 25 MG TABS tablet Take 1 tablet (25 mg total) by mouth daily with breakfast. 10/19/23   Thapa, [place], MD  EPINEPHrine  (EPIPEN  2-PAK) 0.3 mg/0.3 mL IJ SOAJ injection Inject 0.3 mLs (0.3 mg total) into the muscle once as needed for up to 1 dose for anaphylaxis. 01/10/20   Amon Aloysius BRAVO, MD  gabapentin  (NEURONTIN ) 600 MG tablet Take 1 tablet (600 mg total) by mouth 2 (two) times daily. 12/01/23   Amon Aloysius BRAVO, MD  Insulin   Disposable Pump (OMNIPOD 5 G6 PODS, GEN 5,) MISC Use as directed every 3 (three) days. 09/09/22   [place], MD  insulin  lispro (HUMALOG ) 100 UNIT/ML injection Use up to 60 Units daily in pump 10/19/23   Thapa, [place], MD  Insulin  Syringe-Needle U-100 (INSULIN  SYRINGE 1CC/31GX5/16) 31G X 5/16 1 ML MISC Use to administer Humalog  3 times a day Patient taking differently: 1 tablet by Other route See admin instructions. Use to administer Humalog  3 times a day 10/07/22   [place], MD  isosorbide  mononitrate (IMDUR ) 120 MG 24 hr tablet Take 1 tablet (120 mg total) by mouth daily. 11/29/23   Krasowski, Robert J, MD  metFORMIN  (GLUCOPHAGE ) 500 MG tablet Take 1 tablet (500 mg total) by mouth  2 (two) times daily. 04/16/23   Thapa, [place], MD  mupirocin  ointment (BACTROBAN ) 2 % Apply 1 Application topically 2 (two) times daily. 11/09/23   Amon Aloysius BRAVO, MD  nitroGLYCERIN  (NITROSTAT ) 0.4 MG SL tablet Place 1 tablet (0.4 mg total) under the tongue every 5 (five) minutes for 3 doses as needed for chest pain. 09/10/23   Krasowski, Robert J, MD  ondansetron  (ZOFRAN -ODT) 4 MG disintegrating tablet Take 1 tablet (4 mg total) by mouth every 8 (eight) hours as needed. 12/28/23   Silver Wonda LABOR, PA  pantoprazole  (PROTONIX ) 40 MG tablet Take 1 tablet (40 mg total) by mouth daily. 12/01/23   Paz, Jose E, MD  SKYRIZI PEN 150 MG/ML pen As directed 12/09/23 06/01/24  [provider]    Allergies: Bee venom, Hydrocodone  bit-homatrop mbr, Toradol  [ketorolac  tromethamine ], Prednisone, Sulfadiazine, Morphine and codeine, and Sulfa drugs cross reactors    Review of Systems  Cardiovascular:  Positive for chest pain.    Updated Vital Signs BP 134/86   Pulse 66   Temp 97.9 F (36.6 C)   Resp (!) 22   Ht 5' 9 (1.753 m)   Wt 99.8 kg   SpO2 93%   BMI 32.51 kg/m   Physical Exam Vitals and nursing note reviewed.  HENT:     Head: Normocephalic and atraumatic.  Eyes:     Pupils: Pupils are equal, round, and reactive to light.  Cardiovascular:     Rate and Rhythm: Normal rate and regular rhythm.  Pulmonary:     Effort: Pulmonary effort is normal.     Breath sounds: Normal breath sounds.  Abdominal:     Palpations: Abdomen is soft.     Tenderness: There is no abdominal tenderness.  Skin:    General: Skin is warm and dry.  Neurological:     Mental Status: He is alert.  Psychiatric:        Mood and Affect: Mood normal.     (all labs ordered are listed, but only abnormal results are displayed) Labs Reviewed  COMPREHENSIVE METABOLIC PANEL WITH GFR - Abnormal; Notable for the following components:      Result Value   CO2 21 (*)    Glucose, Bld 183 (*)    Total Bilirubin 2.2 (*)     Anion gap 16 (*)    All other components within normal limits  SALICYLATE LEVEL - Abnormal; Notable for the following components:   Salicylate Lvl <7.0 (*)    All other components within normal limits  ACETAMINOPHEN  LEVEL - Abnormal; Notable for the following components:   Acetaminophen  (Tylenol ), Serum <10 (*)    All other components within normal limits  URINALYSIS, ROUTINE W REFLEX MICROSCOPIC - Abnormal; Notable for the following components:  Glucose, UA >=500 (*)    All other components within normal limits  URINALYSIS, MICROSCOPIC (REFLEX) - Abnormal; Notable for the following components:   Bacteria, UA RARE (*)    All other components within normal limits  CBG MONITORING, ED - Abnormal; Notable for the following components:   Glucose-Capillary 169 (*)    All other components within normal limits  CBG MONITORING, ED - Abnormal; Notable for the following components:   Glucose-Capillary 144 (*)    All other components within normal limits  CBC WITH DIFFERENTIAL/PLATELET  ETHANOL  URINE DRUG SCREEN  TROPONIN T, HIGH SENSITIVITY    EKG: EKG Interpretation Date/Time:  Sunday January 16 2024 15:17:54 EDT Ventricular Rate:  84 PR Interval:  130 QRS Duration:  91 QT Interval:  359 QTC Calculation: 425 R Axis:   -5  Text Interpretation: Sinus rhythm Atrial premature complexes in couplets Inferoposterior infarct, age indeterminate Abnormal lateral Q waves Confirmed by Pamella Sharper [phone]) on 01/16/2024 5:15:21 PM  Radiology: DG Chest Portable 1 View Result Date: 01/16/2024 CLINICAL DATA:  Chest pain. EXAM: PORTABLE CHEST 1 VIEW COMPARISON:  09/08/2023, CT 08/19/2023 FINDINGS: The cardiomediastinal contours are normal. The lungs are clear. Pulmonary vasculature is normal. No consolidation, pleural effusion, or pneumothorax. Thoracic fusion hardware. Left-sided shunt catheter tubing. No acute osseous abnormalities are seen. IMPRESSION: No acute chest findings. Electronically Signed    By: Andrea Gasman M.D.   On: 01/16/2024 17:02     Procedures   Medications Ordered in the ED  amLODipine  (NORVASC ) tablet 5 mg (5 mg Oral Given 01/16/24 2007)  aspirin  chewable tablet 81 mg (81 mg Oral Given 01/16/24 2007)  atorvastatin  (LIPITOR ) tablet 80 mg (80 mg Oral Given 01/16/24 2119)  carvedilol  (COREG ) tablet 12.5 mg (has no administration in time range)  clonazePAM  (KLONOPIN ) tablet 0.5 mg (0.5 mg Oral Not Given 01/16/24 2121)  dicyclomine  (BENTYL ) capsule 20 mg (20 mg Oral Given 01/16/24 2003)  DULoxetine  (CYMBALTA ) DR capsule 120 mg (120 mg Oral Not Given 01/16/24 2102)  empagliflozin  (JARDIANCE ) tablet 25 mg (has no administration in time range)  gabapentin  (NEURONTIN ) capsule 600 mg (600 mg Oral Given 01/16/24 2119)  isosorbide  mononitrate (IMDUR ) 24 hr tablet 120 mg (120 mg Oral Not Given 01/16/24 2102)  metFORMIN  (GLUCOPHAGE ) tablet 500 mg (500 mg Oral Given 01/16/24 2119)  pantoprazole  (PROTONIX ) EC tablet 40 mg (40 mg Oral Given 01/16/24 2006)  insulin  aspart (novoLOG ) injection 0-15 Units (has no administration in time range)  ibuprofen  (ADVIL ) tablet 600 mg (600 mg Oral Given 01/16/24 1610)    Clinical Course as of 01/16/24 2301  Sun Jan 16, 2024  1715 Reviewed laboratory workup.  UDS negative.  No acetaminophen  salicylate alcohol in the system.  High-sensitivity troponin of 11 not consistent with ACS.  CBC unremarkable.  CMP shows hyperglycemia in the 100s consistent with known history of type 2 diabetes.  Medically cleared for TTS evaluation [MP]  1909 TTS evaluation complete..  Patient meets criteria for inpatient psychiatric hospitalization and has been referred for admission.  He will remain boarding in the ED awaiting psychiatric placement.  Home meds have been ordered [MP]    Clinical Course User Index [MP] Pamella Sharper LABOR, DO                                 Medical Decision Making 68 year old male with history as above presenting to the ED given concern for SI.  Multiple life stressors including financial hardship and family issues.  Threatened overdose but did not attempt overdose or other means of self-harm.  Is reporting chest discomfort and headache now.  Has not slept in 4 days and has not eaten much.  Will obtain ED workup including EKG chest x-ray and laboratory workup to evaluate for ACS dysrhythmia, hyperglycemia in the setting of type 2 diabetes, electrolyte imbalance and anemia.  Will also obtain laboratory workup for psychiatric clearance including ethanol level, acetaminophen  level, salicylate level and UDS.  Once medically cleared will obtain TTS evaluation given concern for active SI.  Amount and/or Complexity of Data Reviewed Labs: ordered. Radiology: ordered.  Risk OTC drugs. Prescription drug management.        Final diagnoses:  Suicidal ideation  Chest pain, unspecified type    ED Discharge Orders     None          Pamella Ozell LABOR, DO 01/16/24 2302

## 2024-01-16 NOTE — ED Triage Notes (Signed)
 Pt reports he has not slept in about 4 nights; c/o CP and HA; pt sts he feels like I don't want to be here anymore; pt lives alone (separated from wife who is here with him); he threatened to take pills

## 2024-01-16 NOTE — ED Notes (Signed)
 ED Provider at bedside.

## 2024-01-16 NOTE — ED Notes (Signed)
Pt on call with TTS provider 

## 2024-01-16 NOTE — ED Notes (Signed)
 Spoke with pt's wife [name] with pt's consent, provided update on pt status

## 2024-01-16 NOTE — ED Notes (Signed)
Checked CBG 144

## 2024-01-16 NOTE — ED Notes (Signed)
 Pt got some news from a girl that he has been talking to since February that he met on the internet that she would not be coming to stay with him.  He has never met her, but he has been sending her money. This is the second instance that something like this has happened. In December he sent another woman over 40K in [employer] cards. He is also involved in a scam with someone who is claiming to make him rich. He gave his rent to this last woman and now he may be evicted from him home because he can't pay rent. He had access to a gun but took it to his ex wife. He has been making comments since Tuesday about wanting to be done with life. He then got involved in another scam as of yesterday after taking his gun amongst other things to the pawn shop to get money so that he could send to this other individual. He was then informed that he was being scammed and his wife brought him here. She states he has not cried in there 40 years of marriage and he has been crying over the past few days. He endorses either wanting to take a lot of pills to die or do maybe something else. He endorses just being tired since he hasn't slept in over 4 days.

## 2024-01-16 NOTE — BH Assessment (Signed)
 Comprehensive Clinical Assessment (CCA) Note  01/16/2024 James [name] [id number]  Disposition Donia Snell  NP, Patient is recommended for inpatient treatment   The patient demonstrates the following risk factors for suicide: Chronic risk factors for suicide include: chronic pain. Acute risk factors for suicide include: social withdrawal/isolation and loss (financial, interpersonal, professional). Protective factors for this patient include: coping skills. Considering these factors, the overall suicide risk at this point appears to be low. Patient is appropriate for outpatient follow up.  Patient is a 68 year old male that presents this date voluntary to [facility] being brought in by his ex-wife after patient made statements to self-harm earlier this date. Patient denies any active plan or intent at the time of the assessment although stated earlier this date he had thoughts about overdosing on his medical medications or, do something else. Patient denies any HI or AVH. Patient denies any prior mental health inpatient hospital admissions and denies he is receiving any OP services currently in the form of counseling. Patient states he was diagnosed with depression in 2007 and has been taking Cymbalta  ever since. (See MAR) Patient denies any substance abuse history or prior attempts or gestures at self-harm. Patient reports his current medications are managed by his PCP Aloysius Mech MD through [facility].   Patient states over the last year he has been involved in multiple, scams, with women he has met online with the first individual misrepresenting herself that resulted in patient sending her money (patient would not elaborate on the amount) although states the second woman he has most recently been involved with since December of last year, had him send her over 40 thousand dollars in [employer] and recently terminated their relationship after reporting she was, relocating from [place]  to live  with him. Patient reports he has also been involved with other individuals who said they could, make him rich, which resulted in loss of money and other property also. Patient states he has been married for over 35 years and has just recently been separated from his wife in February of this year. Patient states he resides alone and has been getting disability since 2007 after receiving injuries while working with the [employer]. Patient states he has two adult sons that are his only support along with his ex-wife.  Patient reports he has not slept in the last four days reporting his depression has worsened during that period of time with symptoms to include feeling hopeless, worthless and exhausted. Patient states at times he feels that he, just does not want to be around anymore. Patient per chart review has a PMHx of hypertension, type 2 diabetes, CAD and nonalcoholic cirrhosis. Patient reports that he has not slept in 4 nights. Has ongoing chest pressure and a headache. Patient states he has, little to no appetite, and cites multiple psychological stressors including familial issues and financial hardship as a reason for his symptoms. He spoke with his ex-wife today and threatened to overdose on pills. He denies any current ingestion.  DU Patient is alert and oriented x 5. Patient speaks in a low soft voice that is difficult to understand at times. Patient's memory is intact with thoughts organized. Patient's mood is depressed with affect congruent. Patient does not appear to be responding to internal stimuli.     Chief Complaint:  Chief Complaint  Patient presents with   Chest Pain   Suicidal   Visit Diagnosis:  Major Depressive Disorder, recurrent, moderate, without psychotic features  Suicidal Ideations  CCA Screening, Triage and Referral (STR)  Patient Reported Information How did you hear about us ? Family/Friend  What Is the Reason for Your  Visit/Call Today? Per ED note     Pt got some news from a girl that he has been talking to since February that he met on the internet that she would not be coming to stay with him.  He has never met her, but he has been sending her money. This is the second instance that something like this has happened. In December he sent another woman over 40K in [employer] cards. He is also involved in a scam with someone who is claiming to make him rich. He gave his rent to this last woman and now he may be evicted from him home because he can't pay rent. He had access to a gun but took it to his ex wife. He has been making comments since Tuesday about wanting to be done with life. He then got involved in another scam as of yesterday after taking his gun amongst other things to the pawn shop to get money so that he could send to this other individual. He was then informed that he was being scammed and his wife brought him here. She states he has not cried in there 40 years of marriage and he has been crying over the past few days. He endorses either wanting to take a lot of pills to die or do maybe something else. He endorses just being tired since he hasn't slept in over 4 days.  How Long Has This Been Causing You Problems? <Week  What Do You Feel Would Help You the Most Today? Treatment for Depression or other mood problem   Have You Recently Had Any Thoughts About Hurting Yourself? Yes  Are You Planning to Commit Suicide/Harm Yourself At This time? No   Flowsheet Row ED from 01/16/2024 in [facility] at [facility] from 12/28/2023 in [facility] at [facility] ED from 09/08/2023 in [facility] Department at [facility]  C-SSRS RISK CATEGORY Low Risk No Risk No Risk    Have you Recently Had Thoughts About Hurting Someone Sher[place]? No  Are You Planning to Harm Someone at This Time? No  Explanation: N/A   Have You Used Any Alcohol or  Drugs in the Past 24 Hours? No  How Long Ago Did You Use Drugs or Alcohol? N/A What Did You Use and How Much?  N/A  Do You Currently Have a Therapist/Psychiatrist? No  Name of Therapist/Psychiatrist:  N/A  Have You Been Recently Discharged From Any Office Practice or Programs? No  Explanation of Discharge From Practice/Program: N/A    CCA Screening Triage Referral Assessment Type of Contact: Tele-Assessment  Telemedicine Service Delivery:   Is this Initial or Reassessment? Is this Initial or Reassessment?: Initial Assessment  Date Telepsych consult ordered in [facility]:  Date Telepsych consult ordered in [facility]: 01/16/24  Time Telepsych consult ordered in [facility]:    Location of Assessment: [facility]  Provider Location: [facility] Assessment Services   Collateral Involvement: None   Does Patient Have a Automotive engineer Guardian? No  Legal Guardian Contact Information: n/a pt is  an adult  Copy of Legal Guardianship Form: -- (n/a)  Legal Guardian Notified of Arrival: -- (n/a)  Legal Guardian Notified of Pending Discharge: -- (n/a)  If Minor and Not Living with Parent(s), Who has Custody? N/A  Is CPS involved or ever been involved? Never  Is APS involved or ever been involved? Never   Patient Determined To Be At Risk for Harm To Self or Others Based on Review of Patient Reported Information or Presenting Complaint? No  Method: No Plan  Availability of Means: No access or NA  Intent: Vague intent or NA  Notification Required: -- (n/a)  Additional Information for Danger to Others Potential:N/A Additional Comments for Danger to Others Potential: N/A  Are There Guns or Other Weapons in Your Home? No  Types of Guns/Weapons: Pt pawned his gun  Are These Weapons Safely Secured?                            -- (N/A)  Who Could Verify You Are Able To Have These Secured: ex-wife  Do You Have any Outstanding Charges, Pending Court Dates, Parole/Probation?  denies  Contacted To Inform of Risk of Harm To Self or Others: -- (N/A)    Does Patient Present under Involuntary Commitment? No    [place] of Residence: [place]   Patient Currently Receiving the Following Services: Medication Management   Determination of Need: Urgent (48 hours)   Options For Referral: Inpatient Hospitalization; Medication Management; Outpatient Therapy     CCA Biopsychosocial Patient Reported Schizophrenia/Schizoaffective Diagnosis in Past: No   Strengths: Willing to ge help for mental health   Mental Health Symptoms Depression:  Sleep (too much or little); Hopelessness; Change in energy/activity   Duration of Depressive symptoms: Duration of Depressive Symptoms: Less than two weeks   Mania:  Racing thoughts   Anxiety:   Restlessness; Sleep; Worrying; Tension; Difficulty concentrating   Psychosis:  None   Duration of Psychotic symptoms:    Trauma:  None   Obsessions:  None   Compulsions:  None   Inattention:  None   Hyperactivity/Impulsivity:  None   Oppositional/Defiant Behaviors:  None   Emotional Irregularity:  Chronic feelings of emptiness; Intense/unstable relationships; Unstable self-image   Other Mood/Personality Symptoms:  n/a    Mental Status Exam Appearance and self-care  Stature:  Average   Weight:  Average weight   Clothing:  -- (hospital scrubs)   Grooming:  Normal   Cosmetic use:  None   Posture/gait:  Other (Comment) (Lying in bed)   Motor activity:  -- (uta due to pt being in bed)   Sensorium  Attention:  Normal   Concentration:  Normal   Orientation:  Person; Place; Situation   Recall/memory:  Normal   Affect and Mood  Affect:  Flat   Mood:  Depressed   Relating  Eye contact:  Normal   Facial expression:  Sad   Attitude toward examiner:  Cooperative   Thought and Language  Speech flow: Clear and Coherent   Thought content:  Appropriate to Mood and Circumstances   Preoccupation:   None   Hallucinations:  None   Organization:  Coherent   [employer] of Knowledge:  Average   Intelligence:  Average  Abstraction:  Functional   Judgement:  Impaired   Reality Testing:  Adequate   Insight:  Lacking   Decision Making:  Impulsive   Social Functioning  Social Maturity:  Irresponsible   Social Judgement:  Naive   Stress  Stressors:  Housing; Surveyor, quantity; Relationship   Coping Ability:  Deficient supports   Skill Deficits:  Decision making   Supports:  Support needed     Religion: Religion/Spirituality Are  You A Religious Person?: No How Might This Affect Treatment?: N/A  Leisure/Recreation: Leisure / Recreation Do You Have Hobbies?: Yes Leisure and Hobbies: collecting coca cola memorabila  Exercise/Diet: Exercise/Diet Do You Exercise?: No Have You Gained or Lost A Significant Amount of Weight in the Past Six Months?: No Do You Follow a Special Diet?: No Do You Have Any Trouble Sleeping?: Yes Explanation of Sleeping Difficulties: pt states he has not slept in 4 days   CCA Employment/Education Employment/Work Situation: Employment / Work Situation Employment Situation: On disability Why is Patient on Disability: due to an injury on the job How Long has Patient Been on Disability: since 2007 Patient's Job has Been Impacted by Current Illness: No Has Patient ever Been in the [employer]?: No  Education: Education Is Patient Currently Attending School?: No Last Grade Completed: 12 Did You Attend College?: No Did You Have An Individualized Education Program (IIEP): No Did You Have Any Difficulty At School?: No Patient's Education Has Been Impacted by Current Illness: No   CCA Family/Childhood History Family and Relationship History: Family history Marital status: Separated Separated, when?: Feb 2025 What types of issues is patient dealing with in the relationship?: irreconcilable differnces Additional relationship  information: Pt was sending money to other women  Childhood History:  Childhood History By whom was/is the patient raised?: Both parents Did patient suffer any verbal/emotional/physical/sexual abuse as a child?: No Did patient suffer from severe childhood neglect?: No Has patient ever been sexually abused/assaulted/raped as an adolescent or adult?: No Was the patient ever a victim of a crime or a disaster?: No Witnessed domestic violence?: No Has patient been affected by domestic violence as an adult?: No       CCA Substance Use Alcohol/Drug Use:                           ASAM's:  Six Dimensions of Multidimensional Assessment  Dimension 1:  Acute Intoxication and/or Withdrawal Potential:      Dimension 2:  Biomedical Conditions and Complications:      Dimension 3:  Emotional, Behavioral, or Cognitive Conditions and Complications:     Dimension 4:  Readiness to Change:     Dimension 5:  Relapse, Continued use, or Continued Problem Potential:     Dimension 6:  Recovery/Living Environment:     ASAM Severity Score:    ASAM Recommended Level of Treatment:     Substance use Disorder (SUD)    Recommendations for Services/Supports/Treatments:    Disposition Recommendation per psychiatric provider: We recommend inpatient psychiatric hospitalization when medically cleared. Patient is under voluntary admission status at this time; please IVC if attempts to leave hospital.   DSM5 Diagnoses: Patient Active Problem List   Diagnosis Date Noted   Acute pancreatitis 08/20/2023   Elevated lipase 08/19/2023   Uncontrolled type 2 diabetes mellitus with hypoglycemia, with long-term current use of insulin  (HCC) 08/19/2023   Weight loss 08/19/2023   Organic impotence 08/16/2023   Chronic kidney disease, stage 3b (HCC) 07/14/2023   Bladder wall thickening 11/18/2022   Long term (current) use of antibiotics 01/09/2022   MSSA bacteremia 12/27/2021   Acute renal failure  superimposed on stage 3a chronic kidney disease (HCC) 12/26/2021   Osteomyelitis of great toe of left foot (HCC) 12/25/2021   Precordial chest pain 08/15/2021   Unstable angina (HCC) 08/15/2021   Kidney stone    Hypertension    Diabetes mellitus with neuropathy (HCC)    CAD (coronary  artery disease) 09/27/2020   Abnormal cardiac CT angiography    Psoriasis    Post-traumatic hydrocephalus (HCC)    Neuropathy    Neuromuscular disorder (HCC)    Morbid obesity (HCC)    Hyperlipidemia    History of kidney stones    Fatty liver    Elevated LFTs    Eczema    Complication of anesthesia    Colon polyps    Cirrhosis (HCC)    Arthritis    VP (ventriculoperitoneal) shunt status 07/31/2020   Essential hypertension 10/10/2019   Lower back injury 08/14/2019   SI (sacroiliac) joint dysfunction 08/14/2019   Dyslipidemia 05/29/2019   Obesity (BMI 30-39.9) 04/05/2019   Low back pain 04/05/2019   Essential (primary) hypertension 04/05/2019   Spondylolisthesis, lumbar region 03/16/2019   IDA (iron  deficiency anemia) 01/24/2019   Coronary artery disease 01/23/2019   Chronic migraine w/o aura, not intractable, w/o stat migr 10/24/2018   Epidural lipomatosis 10/05/2018   Nonalcoholic steatohepatitis 10/05/2018   History of MRI of lumbar spine 09/07/2018   Episodic confusion 09/07/2018   Hepatitis 10/2017   Idiopathic intracranial hypertension 01/14/2017   REM behavioral disorder 01/14/2017   Head trauma 11/19/2016   Liver cirrhosis secondary to NASH (nonalcoholic steatohepatitis) (HCC) 01/02/2016   H/O craniotomy 05/07/2015   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   Annual physical exam 04/08/2015   PCP NOTES >>> 04/08/2015   Hypersomnia with sleep apnea 01/28/2015   Severe obesity (BMI >= 40) (HCC) 01/28/2015   Obstructive hydrocephalus (HCC) 01/28/2015   Chronic fatigue 01/28/2015   Depression 09/04/2014   OSA -- dx ~ 2012, cpap intolerant 09/04/2014   Insomnia  04/26/2013   Sigmoid diverticulitis 04/25/2013   Presence of cerebrospinal fluid drainage device 07/28/2011   Psoriatic arthritis (HCC) 04/28/2011   Headache 04/28/2011   GERD (gastroesophageal reflux disease) 04/28/2011   Testosterone  deficiency 04/28/2011     Referrals to Alternative Service(s): Referred to Alternative Service(s):   Place:   Date:   Time:    Referred to Alternative Service(s):   Place:   Date:   Time:    Referred to Alternative Service(s):   Place:   Date:   Time:    Referred to Alternative Service(s):   Place:   Date:   Time:     Lianne JINNY Shuck, LCSW

## 2024-01-17 ENCOUNTER — Encounter: Payer: Self-pay | Admitting: Family Medicine

## 2024-01-17 ENCOUNTER — Inpatient Hospital Stay (HOSPITAL_COMMUNITY)
Admission: AD | Admit: 2024-01-17 | Discharge: 2024-01-25 | DRG: 885 | Disposition: A | Discharge status: Home / Self Care | Source: Intra-hospital | Attending: Psychiatry | Admitting: Psychiatry

## 2024-01-17 ENCOUNTER — Other Ambulatory Visit: Payer: Self-pay

## 2024-01-17 DIAGNOSIS — Z801 Family history of malignant neoplasm of trachea, bronchus and lung: Secondary | ICD-10-CM

## 2024-01-17 DIAGNOSIS — Z8279 Family history of other congenital malformations, deformations and chromosomal abnormalities: Secondary | ICD-10-CM

## 2024-01-17 DIAGNOSIS — Z794 Long term (current) use of insulin: Principal | ICD-10-CM

## 2024-01-17 DIAGNOSIS — Z5986 Financial insecurity: Secondary | ICD-10-CM

## 2024-01-17 DIAGNOSIS — Z7984 Long term (current) use of oral hypoglycemic drugs: Secondary | ICD-10-CM | POA: Diagnosis not present

## 2024-01-17 DIAGNOSIS — Z7982 Long term (current) use of aspirin: Secondary | ICD-10-CM

## 2024-01-17 DIAGNOSIS — K7581 Nonalcoholic steatohepatitis (NASH): Secondary | ICD-10-CM | POA: Diagnosis present

## 2024-01-17 DIAGNOSIS — Z955 Presence of coronary angioplasty implant and graft: Secondary | ICD-10-CM

## 2024-01-17 DIAGNOSIS — Z79899 Other long term (current) drug therapy: Secondary | ICD-10-CM

## 2024-01-17 DIAGNOSIS — E1165 Type 2 diabetes mellitus with hyperglycemia: Secondary | ICD-10-CM | POA: Diagnosis not present

## 2024-01-17 DIAGNOSIS — Z882 Allergy status to sulfonamides status: Secondary | ICD-10-CM

## 2024-01-17 DIAGNOSIS — F332 Major depressive disorder, recurrent severe without psychotic features: Principal | ICD-10-CM | POA: Diagnosis present

## 2024-01-17 DIAGNOSIS — Z981 Arthrodesis status: Secondary | ICD-10-CM

## 2024-01-17 DIAGNOSIS — I951 Orthostatic hypotension: Secondary | ICD-10-CM | POA: Diagnosis present

## 2024-01-17 DIAGNOSIS — E785 Hyperlipidemia, unspecified: Secondary | ICD-10-CM | POA: Diagnosis present

## 2024-01-17 DIAGNOSIS — Z885 Allergy status to narcotic agent status: Secondary | ICD-10-CM

## 2024-01-17 DIAGNOSIS — Z6832 Body mass index (BMI) 32.0-32.9, adult: Secondary | ICD-10-CM | POA: Diagnosis not present

## 2024-01-17 DIAGNOSIS — Z982 Presence of cerebrospinal fluid drainage device: Secondary | ICD-10-CM | POA: Diagnosis not present

## 2024-01-17 DIAGNOSIS — N1832 Chronic kidney disease, stage 3b: Secondary | ICD-10-CM | POA: Diagnosis present

## 2024-01-17 DIAGNOSIS — L4050 Arthropathic psoriasis, unspecified: Secondary | ICD-10-CM | POA: Diagnosis present

## 2024-01-17 DIAGNOSIS — R45851 Suicidal ideations: Secondary | ICD-10-CM | POA: Diagnosis present

## 2024-01-17 DIAGNOSIS — L409 Psoriasis, unspecified: Secondary | ICD-10-CM | POA: Diagnosis present

## 2024-01-17 DIAGNOSIS — Z9049 Acquired absence of other specified parts of digestive tract: Secondary | ICD-10-CM

## 2024-01-17 DIAGNOSIS — Z635 Disruption of family by separation and divorce: Secondary | ICD-10-CM

## 2024-01-17 DIAGNOSIS — Z8711 Personal history of peptic ulcer disease: Secondary | ICD-10-CM

## 2024-01-17 DIAGNOSIS — Z8249 Family history of ischemic heart disease and other diseases of the circulatory system: Secondary | ICD-10-CM

## 2024-01-17 DIAGNOSIS — K219 Gastro-esophageal reflux disease without esophagitis: Secondary | ICD-10-CM | POA: Diagnosis present

## 2024-01-17 DIAGNOSIS — E66811 Obesity, class 1: Secondary | ICD-10-CM | POA: Diagnosis present

## 2024-01-17 DIAGNOSIS — I129 Hypertensive chronic kidney disease with stage 1 through stage 4 chronic kidney disease, or unspecified chronic kidney disease: Secondary | ICD-10-CM | POA: Diagnosis present

## 2024-01-17 DIAGNOSIS — R4189 Other symptoms and signs involving cognitive functions and awareness: Secondary | ICD-10-CM | POA: Diagnosis not present

## 2024-01-17 DIAGNOSIS — Z888 Allergy status to other drugs, medicaments and biological substances status: Secondary | ICD-10-CM

## 2024-01-17 DIAGNOSIS — E11649 Type 2 diabetes mellitus with hypoglycemia without coma: Principal | ICD-10-CM

## 2024-01-17 DIAGNOSIS — E1140 Type 2 diabetes mellitus with diabetic neuropathy, unspecified: Secondary | ICD-10-CM | POA: Diagnosis present

## 2024-01-17 DIAGNOSIS — E1122 Type 2 diabetes mellitus with diabetic chronic kidney disease: Secondary | ICD-10-CM | POA: Diagnosis present

## 2024-01-17 DIAGNOSIS — M1990 Unspecified osteoarthritis, unspecified site: Secondary | ICD-10-CM | POA: Diagnosis present

## 2024-01-17 DIAGNOSIS — Z96642 Presence of left artificial hip joint: Secondary | ICD-10-CM | POA: Diagnosis present

## 2024-01-17 DIAGNOSIS — K7460 Unspecified cirrhosis of liver: Secondary | ICD-10-CM | POA: Diagnosis present

## 2024-01-17 DIAGNOSIS — Z638 Other specified problems related to primary support group: Secondary | ICD-10-CM

## 2024-01-17 DIAGNOSIS — Z91030 Bee allergy status: Secondary | ICD-10-CM

## 2024-01-17 DIAGNOSIS — I2510 Atherosclerotic heart disease of native coronary artery without angina pectoris: Secondary | ICD-10-CM | POA: Diagnosis present

## 2024-01-17 DIAGNOSIS — Z860100 Personal history of colon polyps, unspecified: Secondary | ICD-10-CM

## 2024-01-17 DIAGNOSIS — Z9641 Presence of insulin pump (external) (internal): Secondary | ICD-10-CM | POA: Diagnosis present

## 2024-01-17 DIAGNOSIS — G911 Obstructive hydrocephalus: Secondary | ICD-10-CM | POA: Diagnosis present

## 2024-01-17 DIAGNOSIS — Z87442 Personal history of urinary calculi: Secondary | ICD-10-CM

## 2024-01-17 DIAGNOSIS — G4700 Insomnia, unspecified: Secondary | ICD-10-CM | POA: Diagnosis present

## 2024-01-17 LAB — CBG MONITORING, ED
Glucose-Capillary: 112 mg/dL — ABNORMAL HIGH (ref 70–99)
Glucose-Capillary: 115 mg/dL — ABNORMAL HIGH (ref 70–99)
Glucose-Capillary: 160 mg/dL — ABNORMAL HIGH (ref 70–99)

## 2024-01-17 LAB — GLUCOSE, CAPILLARY
Glucose-Capillary: 171 mg/dL — ABNORMAL HIGH (ref 70–99)
Glucose-Capillary: 131 mg/dL — ABNORMAL HIGH (ref 70–99)

## 2024-01-17 LAB — RESP PANEL BY RT-PCR (RSV, FLU A&B, COVID)  RVPGX2
Influenza A by PCR: NEGATIVE
Influenza B by PCR: NEGATIVE
Resp Syncytial Virus by PCR: NEGATIVE
SARS Coronavirus 2 by RT PCR: NEGATIVE

## 2024-01-17 MED ORDER — TRAZODONE HCL 50 MG PO TABS
50.0000 mg | ORAL_TABLET | Freq: Every evening | ORAL | Status: DC | PRN
  Administered 2024-01-17 – 2024-01-24 (×4): 50 mg via ORAL
  Filled 2024-01-17 (×4): qty 1

## 2024-01-17 MED ORDER — METFORMIN HCL 500 MG PO TABS
500.0000 mg | ORAL_TABLET | Freq: Two times a day (BID) | ORAL | Status: DC
  Administered 2024-01-17 – 2024-01-25 (×16): 500 mg via ORAL
  Filled 2024-01-17 (×17): qty 1

## 2024-01-17 MED ORDER — MELATONIN 3 MG PO TABS
3.0000 mg | ORAL_TABLET | Freq: Every day | ORAL | Status: DC
  Administered 2024-01-17: 3 mg via ORAL
  Filled 2024-01-17: qty 1

## 2024-01-17 MED ORDER — LORAZEPAM 2 MG/ML IJ SOLN: 2.0000 mg | Freq: Three times a day (TID) | INTRAMUSCULAR | Status: DC | PRN

## 2024-01-17 MED ORDER — AMLODIPINE BESYLATE 5 MG PO TABS
5.0000 mg | ORAL_TABLET | Freq: Every day | ORAL | Status: DC
  Administered 2024-01-18 – 2024-01-19 (×2): 5 mg via ORAL
  Filled 2024-01-17 (×3): qty 1

## 2024-01-17 MED ORDER — ASPIRIN 81 MG PO CHEW
81.0000 mg | CHEWABLE_TABLET | Freq: Every day | ORAL | Status: DC
  Administered 2024-01-18 – 2024-01-25 (×8): 81 mg via ORAL
  Filled 2024-01-17 (×8): qty 1

## 2024-01-17 MED ORDER — DULOXETINE HCL 60 MG PO CPEP
120.0000 mg | ORAL_CAPSULE | Freq: Every day | ORAL | Status: DC
  Administered 2024-01-18 – 2024-01-25 (×8): 120 mg via ORAL
  Filled 2024-01-17 (×8): qty 2

## 2024-01-17 MED ORDER — CARVEDILOL 6.25 MG PO TABS
12.5000 mg | ORAL_TABLET | Freq: Two times a day (BID) | ORAL | Status: DC
  Administered 2024-01-17 – 2024-01-25 (×14): 12.5 mg via ORAL
  Filled 2024-01-17 (×16): qty 2

## 2024-01-17 MED ORDER — GABAPENTIN 300 MG PO CAPS
600.0000 mg | ORAL_CAPSULE | Freq: Two times a day (BID) | ORAL | Status: DC
  Administered 2024-01-17 – 2024-01-25 (×16): 600 mg via ORAL
  Filled 2024-01-17 (×16): qty 2

## 2024-01-17 MED ORDER — MAGNESIUM HYDROXIDE 400 MG/5ML PO SUSP: 30.0000 mL | Freq: Every day | ORAL | Status: DC | PRN

## 2024-01-17 MED ORDER — DIPHENHYDRAMINE HCL 50 MG/ML IJ SOLN: 50.0000 mg | Freq: Three times a day (TID) | INTRAMUSCULAR | Status: DC | PRN

## 2024-01-17 MED ORDER — ENSURE PLUS HIGH PROTEIN PO LIQD
237.0000 mL | Freq: Two times a day (BID) | ORAL | Status: DC
  Administered 2024-01-17 – 2024-01-23 (×4): 237 mL via ORAL

## 2024-01-17 MED ORDER — ISOSORBIDE MONONITRATE ER 60 MG PO TB24
120.0000 mg | ORAL_TABLET | Freq: Every day | ORAL | Status: DC
  Administered 2024-01-18 – 2024-01-25 (×8): 120 mg via ORAL
  Filled 2024-01-17 (×9): qty 2

## 2024-01-17 MED ORDER — EMPAGLIFLOZIN 25 MG PO TABS
25.0000 mg | ORAL_TABLET | Freq: Every day | ORAL | Status: DC
  Administered 2024-01-18 – 2024-01-25 (×8): 25 mg via ORAL
  Filled 2024-01-17 (×8): qty 1

## 2024-01-17 MED ORDER — INSULIN ASPART 100 UNIT/ML IJ SOLN
0.0000 [IU] | Freq: Three times a day (TID) | INTRAMUSCULAR | Status: DC
  Administered 2024-01-18: 3 [IU] via SUBCUTANEOUS
  Administered 2024-01-19: 2 [IU] via SUBCUTANEOUS
  Administered 2024-01-19: 11 [IU] via SUBCUTANEOUS
  Administered 2024-01-20: 3 [IU] via SUBCUTANEOUS
  Administered 2024-01-21: 2 [IU] via SUBCUTANEOUS
  Administered 2024-01-21 – 2024-01-22 (×3): 3 [IU] via SUBCUTANEOUS
  Administered 2024-01-23: 2 [IU] via SUBCUTANEOUS
  Administered 2024-01-23 – 2024-01-25 (×4): 3 [IU] via SUBCUTANEOUS
  Filled 2024-01-17 (×6): qty 1

## 2024-01-17 MED ORDER — HALOPERIDOL 5 MG PO TABS: 5.0000 mg | ORAL_TABLET | Freq: Three times a day (TID) | ORAL | Status: DC | PRN

## 2024-01-17 MED ORDER — ACETAMINOPHEN 325 MG PO TABS
650.0000 mg | ORAL_TABLET | Freq: Four times a day (QID) | ORAL | Status: DC | PRN
  Administered 2024-01-17 – 2024-01-25 (×9): 650 mg via ORAL
  Filled 2024-01-17 (×10): qty 2

## 2024-01-17 MED ORDER — ATORVASTATIN CALCIUM 80 MG PO TABS
80.0000 mg | ORAL_TABLET | Freq: Every day | ORAL | Status: DC
  Administered 2024-01-17 – 2024-01-24 (×8): 80 mg via ORAL
  Filled 2024-01-17 (×8): qty 1

## 2024-01-17 MED ORDER — DIPHENHYDRAMINE HCL 25 MG PO CAPS: 50.0000 mg | ORAL_CAPSULE | Freq: Three times a day (TID) | ORAL | Status: DC | PRN

## 2024-01-17 MED ORDER — HALOPERIDOL LACTATE 5 MG/ML IJ SOLN: 10.0000 mg | Freq: Three times a day (TID) | INTRAMUSCULAR | Status: DC | PRN

## 2024-01-17 MED ORDER — HALOPERIDOL LACTATE 5 MG/ML IJ SOLN: 5.0000 mg | Freq: Three times a day (TID) | INTRAMUSCULAR | Status: DC | PRN

## 2024-01-17 MED ORDER — MELATONIN 5 MG PO TABS
5.0000 mg | ORAL_TABLET | Freq: Every day | ORAL | Status: DC
  Administered 2024-01-17 – 2024-01-24 (×8): 5 mg via ORAL
  Filled 2024-01-17 (×8): qty 1

## 2024-01-17 MED ORDER — DICYCLOMINE HCL 10 MG PO CAPS
20.0000 mg | ORAL_CAPSULE | Freq: Two times a day (BID) | ORAL | Status: DC | PRN
  Filled 2024-01-17: qty 2

## 2024-01-17 MED ORDER — ACETAMINOPHEN 325 MG PO TABS
650.0000 mg | ORAL_TABLET | Freq: Once | ORAL | Status: AC
  Administered 2024-01-17: 650 mg via ORAL
  Filled 2024-01-17: qty 2

## 2024-01-17 MED ORDER — PANTOPRAZOLE SODIUM 40 MG PO TBEC
40.0000 mg | DELAYED_RELEASE_TABLET | Freq: Every day | ORAL | Status: DC
  Administered 2024-01-18 – 2024-01-25 (×8): 40 mg via ORAL
  Filled 2024-01-17 (×8): qty 1

## 2024-01-17 MED ORDER — CLONAZEPAM 0.5 MG PO TABS
0.5000 mg | ORAL_TABLET | Freq: Every day | ORAL | Status: DC
  Administered 2024-01-17 – 2024-01-19 (×3): 0.5 mg via ORAL
  Filled 2024-01-17 (×3): qty 1

## 2024-01-17 MED ORDER — ALUM & MAG HYDROXIDE-SIMETH 200-200-20 MG/5ML PO SUSP: 30.0000 mL | ORAL | Status: DC | PRN

## 2024-01-17 MED ORDER — HYDROXYZINE HCL 25 MG PO TABS
25.0000 mg | ORAL_TABLET | Freq: Three times a day (TID) | ORAL | Status: DC | PRN
  Administered 2024-01-18: 25 mg via ORAL
  Filled 2024-01-17: qty 1

## 2024-01-17 NOTE — Group Note (Signed)
 Date:  01/17/2024 Time:  8:59 PM  Group Topic/Focus:  Developing a Wellness Toolbox:   The focus of this group is to help patients develop a wellness toolbox with skills and strategies to promote recovery upon discharge. Dimensions of Wellness:   The focus of this group is to introduce the topic of wellness and discuss the role each dimension of wellness plays in total health. Wellness Toolbox:   The focus of this group is to discuss various aspects of wellness, balancing those aspects and exploring ways to increase the ability to experience wellness.  Patients will create a wellness toolbox for use upon discharge.    Participation Level:  Active  Participation Quality:  Appropriate, Sharing, and Supportive  Affect:  Appropriate  Cognitive:  Appropriate  Insight: Appropriate and Good  Engagement in Group:  Engaged and Supportive  Modes of Intervention:  Socialization and Support  Additional Comments:  Pt was attentive, appropriate and socialized with peers in group session. Will continue to encourage pt to attend group.  Brad GORMAN Ryder 01/17/2024, 8:59 PM

## 2024-01-17 NOTE — Plan of Care (Signed)
  Problem: Education: Goal: Emotional status will improve Outcome: Not Progressing Goal: Mental status will improve Outcome: Not Progressing   Problem: Coping: Goal: Ability to verbalize frustrations and anger appropriately will improve Outcome: Not Progressing Goal: Ability to demonstrate self-control will improve Outcome: Not Progressing   Problem: Health Behavior/Discharge Planning: Goal: Identification of resources available to assist in meeting health care needs will improve Outcome: Not Progressing Goal: Compliance with treatment plan for underlying cause of condition will improve Outcome: Not Progressing   Problem: Coping: Goal: Coping ability will improve Outcome: Not Progressing

## 2024-01-17 NOTE — Group Note (Signed)
 Recreation Therapy Group Note   Group Topic:Goal Setting  Group Date: 01/17/2024 Start Time: 1500 End Time: 1600 Facilitators: Celestia Jeoffrey BRAVO, LRT, CTRS Location: Dayroom  Group Description: Product/process development scientist. Patients were given many different magazines, a glue stick, markers, and a piece of cardstock paper. LRT and pts discussed the importance of having goals in life. LRT and pts discussed the difference between short-term and long-term goals, as well as what a SMART goal is. LRT encouraged pts to create a vision board, with images they picked and then cut out with safety scissors from the magazine, for themselves, that capture their short and long-term goals. LRT encouraged pts to show and explain their vision board to the group.   Goal Area(s) Addressed:  Patient will gain knowledge of short vs. long term goals.  Patient will identify goals for themselves. Patient will practice setting SMART goals.   Affect/Mood: N/A   Participation Level: Did not attend    Clinical Observations/Individualized Feedback: James [name] did not attend group due to being a new admission.   Plan: Continue to engage patient in RT group sessions 2-3x/week.   Jeoffrey BRAVO Celestia, LRT, CTRS 01/17/2024 5:36 PM

## 2024-01-17 NOTE — Progress Notes (Signed)
 Pt was accepted to [facility] 01/17/2024 Bed Assignment L 30   Address: [address] [place], [place], [place] [address]  [facility] Fax: [phone]  Pt meets inpatient criteria per: Donia Snell NP  Attending Physician will be Dr. Allyn Donnelly COME  Report can be called to: [phone]  Pt can arrive after discharges   Care Team notified: Cherylynn Ernst RN, Garen Daring RN,     [place] Custer Pimenta MSW, [facility] 01/17/2024 12:18 PM

## 2024-01-17 NOTE — Group Note (Signed)
 Date:  01/17/2024 Time:  6:33 PM  Group Topic/Focus:  Healthy Communication:   The focus of this group is to discuss communication, barriers to communication, as well as healthy ways to communicate with others.    Participation Level:  Did Not Attend     James [name] 01/17/2024, 6:33 PM

## 2024-01-17 NOTE — Tx Team (Signed)
 Initial Treatment Plan 01/17/2024 3:17 PM ORIS STAFFIERI II [id number]    PATIENT STRESSORS: Marital or family conflict     PATIENT STRENGTHS: Ability for insight  Active sense of humor  Communication skills    PATIENT IDENTIFIED PROBLEMS:   Suicidal thoughts    Poor sleep    Poor appetite           DISCHARGE CRITERIA:  Ability to meet basic life and health needs Adequate post-discharge living arrangements Improved stabilization in mood, thinking, and/or behavior Safe-care adequate arrangements made  PRELIMINARY DISCHARGE PLAN: Attend aftercare/continuing care group Return to previous living arrangement  PATIENT/FAMILY INVOLVEMENT: This treatment plan has been presented to and reviewed with the patient, Linwood Gullikson II. The patient has been given the opportunity to ask questions and make suggestions.  Garen CINDERELLA Daring, RN 01/17/2024, 3:17 PM

## 2024-01-17 NOTE — ED Notes (Addendum)
 Patient ambulated to the bathroom no assist Patient stated that they were not able to urinate/RN notified

## 2024-01-17 NOTE — ED Notes (Signed)
 Another lean cuisine chicken/mashed potatoes Baked potato chips

## 2024-01-17 NOTE — Progress Notes (Signed)
   01/17/24 1800  Psych Admission Type (Psych Patients Only)  Admission Status Voluntary  Psychosocial Assessment  Patient Complaints Anxiety;Loneliness;Depression  Eye Contact Fair  Facial Expression Animated  Affect Anxious  Speech Soft  Interaction Sarcastic  Motor Activity Unsteady  Appearance/Hygiene In scrubs  Behavior Characteristics Cooperative;Anxious;Hypersexual  Mood Anxious  Thought Process  Coherency WDL  Content WDL  Delusions None reported or observed  Perception WDL  Hallucination None reported or observed  Judgment WDL  Confusion None  Danger to Self  Current suicidal ideation? Denies

## 2024-01-17 NOTE — ED Notes (Addendum)
 Lean cuisine chicken/mashed potatoes Apple juice

## 2024-01-17 NOTE — ED Notes (Signed)
Patient was able to urinate.

## 2024-01-17 NOTE — Progress Notes (Signed)
 Patient arrived with NO belongings!

## 2024-01-17 NOTE — ED Notes (Signed)
 Placed on 2.5l [place] due to Prior Hx of OSA and desatting while sleeping here.

## 2024-01-17 NOTE — ED Notes (Addendum)
 Safe transport called for transport [phone] [place] assisted

## 2024-01-17 NOTE — ED Notes (Signed)
 Patient was asked if hungry.  Patient does not want to eat at this time

## 2024-01-17 NOTE — Progress Notes (Signed)
 PPatient is a 68 year old male that presents voluntary to [place] from [facility] with diagnosis of depression. Patient brought in by his ex-wife after making statements about harming himself. Patient presents to unit ambulatory A&Ox4.  He states there is a whole lot going on, but would not elaborate. Patient's affect is anxious and mood is congruent. Speech is clear/ soft and thoughts are organized.  Patient endorses depression and anxiety stating he has many stressors. Patient currently denies suicidal ideations, homicidal ideations, audio or visual hallucinations and verbally contracts for safety on unit.  Reports chronic back pain for which he takes Tylenol  at home. Patient reports poor appetite with unknown weight loss. He denies incontinence and reports last BM today. Patient denies smoking, drug abuse, and  ETOH use. Patient reports living alone with no support system other than his ex- wife.  Emotional support and reassurance provided throughout admission intake. Afterwards, oriented patient to unit, room and call light, reviewed POC with all questions answered and understanding verbalzied. Placed pt on high risk fall precautions per policy and provided unit's rolling walker for ambulation. Denies any needs at this time. Will continue to monitor with ongoing Q 15 minute safety checks per unit protocol.

## 2024-01-17 NOTE — ED Notes (Addendum)
 2 visitors Patients son/ ex-wife security wanded

## 2024-01-18 DIAGNOSIS — F332 Major depressive disorder, recurrent severe without psychotic features: Secondary | ICD-10-CM | POA: Diagnosis not present

## 2024-01-18 LAB — GLUCOSE, CAPILLARY
Glucose-Capillary: 190 mg/dL — ABNORMAL HIGH (ref 70–99)
Glucose-Capillary: 181 mg/dL — ABNORMAL HIGH (ref 70–99)
Glucose-Capillary: 224 mg/dL — ABNORMAL HIGH (ref 70–99)

## 2024-01-18 MED ORDER — MUPIROCIN 2 % EX OINT
1.0000 | TOPICAL_OINTMENT | Freq: Two times a day (BID) | CUTANEOUS | Status: DC
  Administered 2024-01-18 – 2024-01-24 (×12): 1 via TOPICAL
  Filled 2024-01-18: qty 22

## 2024-01-18 NOTE — Progress Notes (Signed)
   01/18/24 1100  Psych Admission Type (Psych Patients Only)  Admission Status Voluntary  Psychosocial Assessment  Patient Complaints Anxiety;Depression  Eye Contact Brief  Facial Expression Animated;Anxious  Affect Anxious  Speech Logical/coherent  Interaction Needy  Motor Activity Unsteady  Appearance/Hygiene In scrubs  Behavior Characteristics Cooperative  Mood Pleasant  Thought Process  Coherency WDL  Content WDL  Delusions None reported or observed  Perception WDL  Hallucination None reported or observed  Judgment WDL  Confusion None  Danger to Self  Current suicidal ideation? Denies  Danger to Others  Danger to Others None reported or observed

## 2024-01-18 NOTE — Plan of Care (Signed)
  Problem: Education: Goal: Knowledge of [facility] Education information/materials will improve Outcome: Progressing Goal: Emotional status will improve Outcome: Progressing Goal: Mental status will improve Outcome: Progressing Goal: Verbalization of understanding the information provided will improve Outcome: Progressing   Problem: Activity: Goal: Interest or engagement in activities will improve Outcome: Progressing Goal: Sleeping patterns will improve Outcome: Progressing   Problem: Coping: Goal: Ability to verbalize frustrations and anger appropriately will improve Outcome: Progressing Goal: Ability to demonstrate self-control will improve Outcome: Progressing   Problem: Health Behavior/Discharge Planning: Goal: Identification of resources available to assist in meeting health care needs will improve Outcome: Progressing Goal: Compliance with treatment plan for underlying cause of condition will improve Outcome: Progressing   Problem: Physical Regulation: Goal: Ability to maintain clinical measurements within normal limits will improve Outcome: Progressing   Problem: Safety: Goal: Periods of time without injury will increase Outcome: Progressing   Problem: Coping: Goal: Coping ability will improve Outcome: Progressing

## 2024-01-18 NOTE — Progress Notes (Signed)
   01/18/24 0200  Psych Admission Type (Psych Patients Only)  Admission Status Voluntary  Psychosocial Assessment  Patient Complaints Depression;Restlessness  Eye Contact Fair  Facial Expression Animated;Anxious  Affect Anxious  Speech Logical/coherent;Tangential  Interaction Sarcastic  Motor Activity Unsteady  Appearance/Hygiene In scrubs  Behavior Characteristics Cooperative;Appropriate to situation;Anxious  Mood Anxious  Aggressive Behavior  Effect No apparent injury  Thought Process  Coherency WDL  Content WDL  Delusions None reported or observed  Perception WDL  Hallucination None reported or observed  Judgment WDL  Confusion None  Danger to Self  Current suicidal ideation? Denies  Danger to Others  Danger to Others None reported or observed

## 2024-01-18 NOTE — H&P (Signed)
 Psychiatric Admission Assessment Adult  Patient Identification: James [name] MRN:  [id number] Date of Evaluation:  01/18/2024 Chief Complaint:  MDD (major depressive disorder), recurrent severe, without psychosis (HCC) [F33.2]   History of Present Illness: Patient is a 68 year old male that presents this date voluntary to [facility] being brought in by his ex-wife after patient made statements to self-harm earlier this date. Patient denies any active plan or intent at the time of the assessment although stated earlier this date he had thoughts about overdosing on his medical medications or, do something else. Patient is admitted to [facility] unit with Q15 min safety monitoring. Multidisciplinary team approach is offered. Medication management; group/milieu therapy is offered.   Today on interview patient is noted to be resting in bed.  He participated in the interview.  He reports going through multiple psychosocial stressors in the last 1 week.  He makes statements of rather wanting to die and does not want to be around any body anymore.  He then talks about the whole thing starting with everybody lying to him.  He talks about separating from his wife as last year he was cheating on the wife and talking to another girl.  He reports that he gave the goal $30,000 but the girl left him.  In February 2025 he reports approaching another goal who was supposed to move in with him last week.  He reports that he separated from his wife 3 months ago and is currently living in an apartment.  He reports having 2 grownup sons and reportedly he continues to visit his wife on daily basis.  He reports that on Sunday he got into verbal altercation with his family got upset with his wife and kids and made suicidal statements.  He denies having any intention or plan.  Police were called and patient was brought to the emergency room.  He does endorse chronic depression, anhedonia, poor appetite and sleep with low energy  and motivation, reports feeling guilty about his bad choices.  He did report that insomnia for 4 days made him have suicidal thoughts.  He denies homicidal ideation/intent/plan.  Denies feeling anxious and denies panic attacks.  He denies auditory/visual hallucinations.  He denies having any nightmares or flashbacks.  He denies any history of abuse.  He denies recent or previous episodes of mania.  Total Time spent with patient: 1 hour Sleep  Sleep:Sleep: Fair  Past Psychiatric History:  Psychiatric History:  Information collected from Patient/chart  Prev Dx/Sx: depression Current Psych Provider: PCP Home Meds (current): Cymbalta  Previous Med Trials: None reported Therapy: Denies  Prior Psych Hospitalization: Denies Prior Self Harm: Denies Prior Violence: Denies  Family Psych History: Denies Family Hx suicide: Denies  Social History:   Educational Hx: 12th grade Occupational Hx: On disability Legal Hx: Denies Living Situation: Separated from wife, has 2 grownup children, living separately in an apartment Spiritual Hx: Denies Access to weapons/lethal means: Denies  Substance History Alcohol: Denies  Tobacco: Denies Illicit drugs: Denies Prescription drug abuse: Denies Rehab hx: Denies Is the patient at risk to self? Yes.    Has the patient been a risk to self in the past 6 months? No.  Has the patient been a risk to self within the distant past? No.  Is the patient a risk to others? No.  Has the patient been a risk to others in the past 6 months? No.  Has the patient been a risk to others within the distant past? No.  [place] Scale:  Flowsheet Row Admission (Current) from 01/17/2024 in [facility] BEHAVIORAL MEDICINE ED from 01/16/2024 in [facility] Emergency Department at [facility] ED from 12/28/2023 in [facility] Emergency Department at [facility]  C-SSRS RISK CATEGORY Moderate Risk Low Risk No Risk     Past Medical History:  Past Medical  History:  Diagnosis Date   Abnormal cardiac CT angiography    Acid reflux    Annual physical exam 04/08/2015   Arthritis    Atypical chest pain 06/13/2020   Blood transfusion without reported diagnosis    Body mass index (BMI) 35.0-35.9, adult 04/05/2019   Chronic fatigue 01/28/2015   Chronic headaches    on cymbalta    Chronic kidney disease, stage 3b (HCC) 07/14/2023   Chronic migraine w/o aura, not intractable, w/o stat migr 10/24/2018   Cirrhosis (HCC)    Colon polyps    Complication of anesthesia    problems waking up from anesthesia   Coronary artery disease 01/23/2019   Depression    on cymbalta    Diabetes mellitus with neuropathy (HCC)    Diabetes with neuropathy 04/25/2013   Diverticulitis 03/2013   Dyslipidemia 05/29/2019   Eczema    Elevated LFTs    Epidural lipomatosis 10/05/2018   Essential (primary) hypertension 04/05/2019   Essential hypertension 10/10/2019   Fatty liver    GERD (gastroesophageal reflux disease) 04/28/2011   H/O craniotomy 05/07/2015   Headache 04/28/2011   Hepatitis 10/2017   NASH cirrhosis   History of kidney stones    Hyperlipidemia    Hypersomnia with sleep apnea 01/28/2015   Hypertension    IDA (iron  deficiency anemia) 01/24/2019   Idiopathic intracranial hypertension 01/14/2017   Insomnia 04/26/2013   Kidney stone    Liver cirrhosis secondary to NASH (nonalcoholic steatohepatitis) (HCC) 01/02/2016   Low back pain 04/05/2019   Lower back injury 08/14/2019   Morbid obesity (HCC)    Neuromuscular disorder (HCC)    neuropathy   Neuropathy    Nonalcoholic steatohepatitis 10/05/2018   Obstructive hydrocephalus (HCC) 01/28/2015   OSA -- dx ~ 2012, cpap intolerant 09/04/2014    dx ~ 2012, cpap intolerant    PCP NOTES >>> 04/08/2015   Post-op pain 03/19/2019   Post-traumatic hydrocephalus (HCC)    s/p shunts x 2 (first got infected )   Presence of cerebrospinal fluid drainage device 07/28/2011   Psoriasis    sees Dr Leanord    Psoriatic arthritis (HCC)    REM behavioral disorder 01/14/2017   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   see's Dr.Ortmann   Severe obesity (BMI >= 40) (HCC) 01/28/2015   SI (sacroiliac) joint dysfunction 08/14/2019   Sigmoid diverticulitis 04/25/2013   Sleep apnea    no CPAP      Spondylolisthesis, lumbar region 03/16/2019   Stomach ulcer    Testosterone  deficiency 04/28/2011   VP (ventriculoperitoneal) shunt status 07/31/2020    Past Surgical History:  Procedure Laterality Date   AMPUTATION Left 12/27/2021   Procedure: AMPUTATION GREAT TOE;  Surgeon: Gershon Donnice SAUNDERS, DPM;  Location: [facility] OR;  Service: Podiatry;  Laterality: Left;   BACK SURGERY  1980   BRAIN SURGERY     VP shunts placed in 2007   CHOLECYSTECTOMY N/A 08/25/2017   Procedure: LAPAROSCOPIC CHOLECYSTECTOMY WITH INTRAOPERATIVE CHOLANGIOGRAM;  Surgeon: Curvin Deward MOULD, MD;  Location: [facility] OR;  Service: General;  Laterality: N/A;   COLONOSCOPY     CORONARY STENT INTERVENTION N/A 09/27/2020   Procedure: CORONARY  STENT INTERVENTION;  Surgeon: Dann Candyce RAMAN, MD;  Location: [facility] INVASIVE CV LAB;  Service: Cardiovascular;  Laterality: N/A;   CORONARY ULTRASOUND/IVUS N/A 09/27/2020   Procedure: Intravascular Ultrasound/IVUS;  Surgeon: Dann Candyce RAMAN, MD;  Location: [facility] INVASIVE CV LAB;  Service: Cardiovascular;  Laterality: N/A;   JOINT REPLACEMENT     total hip   LEFT HEART CATH N/A 09/27/2020   Procedure: Left Heart Cath;  Surgeon: Dann Candyce RAMAN, MD;  Location: [facility] INVASIVE CV LAB;  Service: Cardiovascular;  Laterality: N/A;   LEFT HEART CATH AND CORONARY ANGIOGRAPHY N/A 09/24/2020   Procedure: LEFT HEART CATH AND CORONARY ANGIOGRAPHY;  Surgeon: Dann Candyce RAMAN, MD;  Location: [facility] INVASIVE CV LAB;  Service: Cardiovascular;  Laterality: N/A;   LEFT HEART CATH AND CORONARY ANGIOGRAPHY N/A 08/18/2021   Procedure: LEFT HEART CATH AND CORONARY ANGIOGRAPHY;  Surgeon: Burnard Debby LABOR, MD;  Location: [facility]  INVASIVE CV LAB;  Service: Cardiovascular;  Laterality: N/A;   LUMBAR FUSION  03/16/2019   SHOULDER SURGERY Left 2010   TEE WITHOUT CARDIOVERSION N/A 01/01/2022   Procedure: TRANSESOPHAGEAL ECHOCARDIOGRAM (TEE);  Surgeon: Sheena Pugh, DO;  Location: [facility] ENDOSCOPY;  Service: Cardiovascular;  Laterality: N/A;   TOE SURGERY Left 2018   TONSILLECTOMY     as a child   TOTAL HIP ARTHROPLASTY Left 2011   UPPER GASTROINTESTINAL ENDOSCOPY  01/04/2020   VENTRICULOPERITONEAL SHUNT  2007   x2   Family History:  Family History  Problem Relation Age of Onset   Healthy Mother    Lung cancer Father        former smoker   Heart disease Brother        MI age 70   Other Brother        Murdered   Down syndrome Son    Diabetes Neg Hx    Prostate cancer Neg Hx    Colon cancer Neg Hx    Stomach cancer Neg Hx    Pancreatic cancer Neg Hx    Liver disease Neg Hx     Social History:  Social History   Substance and Sexual Activity  Alcohol Use No     Social History   Substance and Sexual Activity  Drug Use No      Allergies:   Allergies  Allergen Reactions   Bee Venom Anaphylaxis   Hydrocodone  Bit-Homatrop Mbr Other (See Comments)    Hallucinations, confusion, delirium Depressed feeling   Toradol  [Ketorolac  Tromethamine ] Other (See Comments)    Hallucinations, confusion, delirium   Prednisone     Patient reports it causes cirrhosis to flare up   Sulfadiazine     NDC Rniz:[id number] NDC Rniz:[id number] NDC Rniz:[id number]   Morphine And Codeine Other (See Comments)    Hallucinations, back in the 80s. States has taken vicodin before w/o problems    Sulfa Drugs Cross Reactors Rash   Lab Results:  Results for orders placed or performed during the hospital encounter of 01/17/24 (from the past 48 hours)  Glucose, capillary     Status: Abnormal   Collection Time: 01/17/24  5:14 PM  Result Value Ref Range   Glucose-Capillary 171 (H) 70 - 99 mg/dL    Comment: Glucose reference range  applies only to samples taken after fasting for at least 8 hours.  Glucose, capillary     Status: Abnormal   Collection Time: 01/17/24  7:28 PM  Result Value Ref Range   Glucose-Capillary 131 (H) 70 - 99 mg/dL    Comment: Glucose reference  range applies only to samples taken after fasting for at least 8 hours.  Glucose, capillary     Status: Abnormal   Collection Time: 01/18/24 11:31 AM  Result Value Ref Range   Glucose-Capillary 190 (H) 70 - 99 mg/dL    Comment: Glucose reference range applies only to samples taken after fasting for at least 8 hours.   *Note: Due to a large number of results and/or encounters for the requested time period, some results have not been displayed. A complete set of results can be found in Results Review.    Blood Alcohol level:  Lab Results  Component Value Date   [facility] <15 01/16/2024    Metabolic Disorder Labs:  Lab Results  Component Value Date   HGBA1C 9.1 (A) 10/19/2023   MPG 139.85 08/15/2021   MPG 148.46 09/27/2020   Lab Results  Component Value Date   PROLACTIN 8.7 10/10/2014   Lab Results  Component Value Date   CHOL 132 01/20/2023   TRIG 145 08/20/2023   HDL 41.00 01/20/2023   CHOLHDL 3 01/20/2023   VLDL 48.0 (H) 01/20/2023   LDLCALC 68 01/30/2022   LDLCALC 55 08/16/2021    Current Medications: Current Facility-Administered Medications  Medication Dose Route Frequency Provider Last Rate Last Admin   acetaminophen  (TYLENOL ) tablet 650 mg  650 mg Oral Q6H PRN Arloa Suzen RAMAN, NP   650 mg at 01/18/24 1221   alum & mag hydroxide-simeth (MAALOX/MYLANTA) 200-200-20 MG/5ML suspension 30 mL  30 mL Oral Q4H PRN Arloa Suzen RAMAN, NP       amLODipine  (NORVASC ) tablet 5 mg  5 mg Oral Daily Arloa Suzen RAMAN, NP   5 mg at 01/18/24 9047   aspirin  chewable tablet 81 mg  81 mg Oral Daily Arloa Suzen RAMAN, NP   81 mg at 01/18/24 9046   atorvastatin  (LIPITOR ) tablet 80 mg  80 mg Oral QHS Arloa Suzen RAMAN, NP   80 mg at 01/17/24 2126    carvedilol  (COREG ) tablet 12.5 mg  12.5 mg Oral BID WC Arloa Suzen RAMAN, NP   12.5 mg at 01/18/24 9046   clonazePAM  (KLONOPIN ) tablet 0.5 mg  0.5 mg Oral QHS Arloa Suzen RAMAN, NP   0.5 mg at 01/17/24 2126   dicyclomine  (BENTYL ) capsule 20 mg  20 mg Oral BID PRN Arloa Suzen RAMAN, NP       haloperidol  (HALDOL ) tablet 5 mg  5 mg Oral TID PRN Arloa Suzen RAMAN, NP       And   diphenhydrAMINE  (BENADRYL ) capsule 50 mg  50 mg Oral TID PRN Arloa Suzen RAMAN, NP       haloperidol  lactate (HALDOL ) injection 5 mg  5 mg Intramuscular TID PRN Arloa Suzen RAMAN, NP       And   diphenhydrAMINE  (BENADRYL ) injection 50 mg  50 mg Intramuscular TID PRN Arloa Suzen RAMAN, NP       And   LORazepam  (ATIVAN ) injection 2 mg  2 mg Intramuscular TID PRN Arloa Suzen RAMAN, NP       haloperidol  lactate (HALDOL ) injection 10 mg  10 mg Intramuscular TID PRN Arloa Suzen RAMAN, NP       And   diphenhydrAMINE  (BENADRYL ) injection 50 mg  50 mg Intramuscular TID PRN Arloa Suzen RAMAN, NP       And   LORazepam  (ATIVAN ) injection 2 mg  2 mg Intramuscular TID PRN Arloa Suzen RAMAN, NP       DULoxetine  (CYMBALTA ) DR capsule 120 mg  120 mg Oral Daily Arloa Suzen RAMAN, NP   120 mg at 01/18/24 9047   empagliflozin  (JARDIANCE ) tablet 25 mg  25 mg Oral Q breakfast Arloa Suzen RAMAN, NP   25 mg at 01/18/24 9047   feeding supplement (ENSURE PLUS HIGH PROTEIN) liquid 237 mL  237 mL Oral BID BM Latesha Chesney, MD   237 mL at 01/18/24 1219   gabapentin  (NEURONTIN ) capsule 600 mg  600 mg Oral BID Arloa Suzen RAMAN, NP   600 mg at 01/18/24 9046   hydrOXYzine  (ATARAX ) tablet 25 mg  25 mg Oral TID PRN Arloa Suzen RAMAN, NP   25 mg at 01/18/24 1417   insulin  aspart (novoLOG ) injection 0-15 Units  0-15 Units Subcutaneous TID WC Arloa Suzen RAMAN, NP   3 Units at 01/18/24 1218   isosorbide  mononitrate (IMDUR ) 24 hr tablet 120 mg  120 mg Oral Daily Arloa Suzen RAMAN, NP   120 mg at 01/18/24 9047   magnesium  hydroxide (MILK OF  MAGNESIA) suspension 30 mL  30 mL Oral Daily PRN Arloa Suzen RAMAN, NP       melatonin tablet 5 mg  5 mg Oral QHS Arloa Suzen RAMAN, NP   5 mg at 01/17/24 2141   metFORMIN  (GLUCOPHAGE ) tablet 500 mg  500 mg Oral BID Arloa Suzen RAMAN, NP   500 mg at 01/18/24 9046   mupirocin  ointment (BACTROBAN ) 2 % 1 Application  1 Application Topical BID Cason Dabney, MD       pantoprazole  (PROTONIX ) EC tablet 40 mg  40 mg Oral Daily Arloa Suzen RAMAN, NP   40 mg at 01/18/24 9046   traZODone  (DESYREL ) tablet 50 mg  50 mg Oral QHS PRN Arloa Suzen RAMAN, NP   50 mg at 01/17/24 2126   PTA Medications: Facility-Administered Medications Prior to Admission  Medication Dose Route Frequency Provider Last Rate Last Admin   botulinum toxin Type A  (BOTOX ) injection 155 Units  155 Units Intramuscular Once Sater, Charlie LABOR, MD       Medications Prior to Admission  Medication Sig Dispense Refill Last Dose/Taking   acetaminophen  (TYLENOL ) 325 MG tablet Take 2 tablets (650 mg total) by mouth every 4 (four) hours as needed for headache or mild pain.      amLODipine  (NORVASC ) 5 MG tablet Take 1 tablet (5 mg total) by mouth daily. 90 tablet 2    aspirin  81 MG chewable tablet Chew 1 tablet (81 mg total) by mouth daily.      atorvastatin  (LIPITOR ) 80 MG tablet Take 1 tablet (80 mg total) by mouth at bedtime. 90 tablet 1    botulinum toxin Type A  (BOTOX ) 200 units injection Provider to inject 155 units into the muscles of the head and neck every 3 months. Discard remainder. (Patient taking differently: Inject 200 Units into the muscle every 3 (three) months. Provider to inject 155 units into the muscles of the head and neck every 3 months. Discard remainder.) 1 each 2    carvedilol  (COREG ) 12.5 MG tablet Take 1 tablet (12.5 mg total) by mouth 2 (two) times daily with a meal. 180 tablet 1    clonazePAM  (KLONOPIN ) 0.5 MG tablet Take 1 tablet (0.5 mg total) by mouth at bedtime. 30 tablet 5    Continuous Glucose Sensor (DEXCOM  G6 SENSOR) MISC Use to monitor blood sugar, change after 10 days (Patient taking differently: 1 each by Other route See admin instructions. Use to monitor blood sugar, change after 10 days) 9 each 3  Continuous Glucose Transmitter (DEXCOM G6 TRANSMITTER) MISC Use as directed for 90 days 1 each 3    dicyclomine  (BENTYL ) 20 MG tablet Take 1 tablet (20 mg total) by mouth 2 (two) times daily as needed. (Patient taking differently: Take 20 mg by mouth 2 (two) times daily as needed for spasms.) 10 tablet 0    DULoxetine  (CYMBALTA ) 60 MG capsule Take 2 capsules (120 mg total) by mouth daily. 180 capsule 1    empagliflozin  (JARDIANCE ) 25 MG TABS tablet Take 1 tablet (25 mg total) by mouth daily with breakfast. 90 tablet 3    EPINEPHrine  (EPIPEN  2-PAK) 0.3 mg/0.3 mL IJ SOAJ injection Inject 0.3 mLs (0.3 mg total) into the muscle once as needed for up to 1 dose for anaphylaxis. 2 each 2    gabapentin  (NEURONTIN ) 600 MG tablet Take 1 tablet (600 mg total) by mouth 2 (two) times daily. 180 tablet 1    Insulin  Disposable Pump (OMNIPOD 5 G6 PODS, GEN 5,) MISC Use as directed every 3 (three) days. 10 each 3    insulin  lispro (HUMALOG ) 100 UNIT/ML injection Use up to 60 Units daily in pump 20 mL 2    Insulin  Syringe-Needle U-100 (INSULIN  SYRINGE 1CC/31GX5/16) 31G X 5/16 1 ML MISC Use to administer Humalog  3 times a day (Patient taking differently: 1 tablet by Other route See admin instructions. Use to administer Humalog  3 times a day) 100 each 0    isosorbide  mononitrate (IMDUR ) 120 MG 24 hr tablet Take 1 tablet (120 mg total) by mouth daily. 90 tablet 2    metFORMIN  (GLUCOPHAGE ) 500 MG tablet Take 1 tablet (500 mg total) by mouth 2 (two) times daily. 180 tablet 3    mupirocin  ointment (BACTROBAN ) 2 % Apply 1 Application topically 2 (two) times daily. 22 g 0    nitroGLYCERIN  (NITROSTAT ) 0.4 MG SL tablet Place 1 tablet (0.4 mg total) under the tongue every 5 (five) minutes for 3 doses as needed for chest pain. 25  tablet 6    ondansetron  (ZOFRAN -ODT) 4 MG disintegrating tablet Take 1 tablet (4 mg total) by mouth every 8 (eight) hours as needed. 20 tablet 0    pantoprazole  (PROTONIX ) 40 MG tablet Take 1 tablet (40 mg total) by mouth daily. 90 tablet 1    SKYRIZI PEN 150 MG/ML pen As directed       Psychiatric Specialty Exam:  Presentation  General Appearance:  Appropriate for Environment; Casual  Eye Contact: Fair  Speech: Clear and Coherent  Speech Volume: Normal    Mood and Affect  Mood: Depressed; Dysphoric  Affect: Depressed; Flat   Thought Process  Thought Processes: Coherent  Descriptions of Associations:Intact  Orientation:Full (Time, Place and Person)  Thought Content:Logical  Hallucinations:Hallucinations: None  Ideas of Reference:None  Suicidal Thoughts:Suicidal Thoughts: No  Homicidal Thoughts:Homicidal Thoughts: No   Sensorium  Memory: Immediate Fair; Remote Fair; Recent Fair  Judgment: Impaired  Insight: None   Executive Functions  Concentration: Fair  Attention Span: Fair  Recall: [employer] of Knowledge: Fair  Language: Fair   Psychomotor Activity  Psychomotor Activity: Psychomotor Activity: Normal   Assets  Assets: Communication Skills; Desire for Improvement; Resilience; Social Support    Musculoskeletal: Strength & Muscle Tone: within normal limits Gait & Station: normal  Physical Exam: Physical Exam Vitals and nursing note reviewed.  HENT:     Head: Normocephalic.     Nose: Nose normal.  Cardiovascular:     Pulses: Normal pulses.  Neurological:  Mental Status: He is alert.    Review of Systems  Constitutional: Negative.   HENT: Negative.    Eyes: Negative.   Cardiovascular: Negative.   Skin: Negative.    Blood pressure 125/86, pulse 70, temperature 97.7 F (36.5 C), resp. rate 17, height 5' 11 (1.803 m), weight 104.3 kg, SpO2 95%. Body mass index is 32.08 kg/m.  Principal Diagnosis: MDD  (major depressive disorder), recurrent severe, without psychosis (HCC) Diagnosis:  Principal Problem:   MDD (major depressive disorder), recurrent severe, without psychosis (HCC)   Clinical Decision Making: Patient with multiple psychosocial stressors including multiple break-ups, separation from wife, trust issues with his girlfriend's, spending away the money, admitted inpatient after making suicidal ideation and statements.  Patient needs to be monitored closely for safety  Treatment Plan Summary:  Safety and Monitoring:             -- Voluntary admission to inpatient psychiatric unit for safety, stabilization and treatment             -- Daily contact with patient to assess and evaluate symptoms and progress in treatment             -- Patient's case to be discussed in multi-disciplinary team meeting             -- Observation Level: q15 minute checks             -- Vital signs:  q12 hours             -- Precautions: suicide, elopement, and assault   2. Psychiatric Diagnoses and Treatment:               Continue Cymbalta .  Will evaluate for any mood disorder and consider mood stabilizer   -- The risks/benefits/side-effects/alternatives to this medication were discussed in detail with the patient and time was given for questions. The patient consents to medication trial.                -- Metabolic profile and EKG monitoring obtained while on an atypical antipsychotic (BMI: Lipid Panel: HbgA1c: QTc:)              -- Encouraged patient to participate in unit milieu and in scheduled group therapies                            3. Medical Issues Being Addressed:      4. Discharge Planning:              -- Social work and case management to assist with discharge planning and identification of hospital follow-up needs prior to discharge             -- Estimated LOS: 5-7 days             -- Discharge Concerns: Need to establish a safety plan; Medication compliance and effectiveness              -- Discharge Goals: Return home with outpatient referrals follow ups  Physician Treatment Plan for Primary Diagnosis: MDD (major depressive disorder), recurrent severe, without psychosis (HCC) Long Term Goal(s): Improvement in symptoms so as ready for discharge  Short Term Goals: Ability to identify changes in lifestyle to reduce recurrence of condition will improve, Ability to verbalize feelings will improve, Ability to disclose and discuss suicidal ideas, Ability to demonstrate self-control will improve, and Ability to identify and develop effective coping behaviors will improve  Physician  Treatment Plan for Secondary Diagnosis: Principal Problem:   MDD (major depressive disorder), recurrent severe, without psychosis (HCC)  Long Term Goal(s): Improvement in symptoms so as ready for discharge  Short Term Goals: Ability to identify changes in lifestyle to reduce recurrence of condition will improve, Ability to verbalize feelings will improve, Ability to disclose and discuss suicidal ideas, Ability to demonstrate self-control will improve, and Ability to identify and develop effective coping behaviors will improve  I certify that inpatient services furnished can reasonably be expected to improve the patient's condition.    Andalyn Heckstall, MD 7/22/20253:46 PM

## 2024-01-18 NOTE — Group Note (Signed)
 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 Patients and LRT discuss what it means to be "at peace", what it feels like physically and mentally. Pts are given a canvas and watercolor paint to use and encouraged to draw their idea of a peaceful place. Pts and LRT discuss how they use this in their daily life post discharge. Pts are encouraged to take their canvas home with them as a reminder to find their peaceful place whenever they are feeling depressed, anxious, etc.    Goal Area(s) Addressed:  Patient will identify what it means to experience a "peaceful" emotion. Patient will identify a new coping skill.  Patient will express their emotions through art. Patients will increase communication by talking with LRT and peers while in group.   Affect/Mood: Appropriate   Participation Level: Active and Engaged   Participation Quality: Independent   Behavior: Appropriate   Speech/Thought Process: Coherent   Insight: Good   Judgement: Good   Modes of Intervention: Art   Patient Response to Interventions:  Attentive, Engaged, Interested , and Receptive   Education Outcome:  Acknowledges education   Clinical Observations/Individualized Feedback: James [name] was active in their participation of session activities and group discussion. Pt identified being by the pond with nature and flowers as a peaceful place. Pt interacted well with LRT and peers duration of session.    Plan: Continue to engage patient in RT group sessions 2-3x/week.   Jeoffrey BRAVO Celestia, LRT, CTRS 01/18/2024 5:09 PM

## 2024-01-18 NOTE — Group Note (Signed)
 Recreation Therapy Group Note   Group Topic:General Recreation  Group Date: 01/18/2024 Start Time: 1100 End Time: 1135 Facilitators: Celestia Jeoffrey BRAVO, LRT, CTRS Location: Courtyard  Group Description: Outdoor Recreation. Patients had the option to play corn hole, ring toss, bowling or listening to music while outside in the courtyard getting fresh air and sunlight. Patients helped water and prune the raised garden beds. LRT and patients discussed things that they enjoy doing in their free time outside of the hospital. LRT encouraged patients to drink water after being active and getting their heart rate up.   Goal Area(s) Addressed: Patient will identify leisure interests.  Patient will practice healthy decision making. Patient will engage in recreation activity.   Affect/Mood: N/A   Participation Level: Did not attend    Clinical Observations/Individualized Feedback: Patient did not attend group.   Plan: Continue to engage patient in RT group sessions 2-3x/week.   Jeoffrey BRAVO Celestia, LRT, CTRS 01/18/2024 2:00 PM

## 2024-01-18 NOTE — Group Note (Signed)
 Date:  01/18/2024 Time:  1:35 PM  Group Topic/Focus:  Coping With Mental Health Crisis:   The purpose of this group is to help patients identify strategies for coping with mental health crisis.  Group discusses possible causes of crisis and ways to manage them effectively. Healthy Communication:   The focus of this group is to discuss communication, barriers to communication, as well as healthy ways to communicate with others. Overcoming Stress:   The focus of this group is to define stress and help patients assess their triggers.    Participation Level:  Active  Participation Quality:  Appropriate  Affect:  Appropriate  Cognitive:  Appropriate  Insight: Appropriate  Engagement in Group:  Engaged  Modes of Intervention:  Activity and Discussion  Additional Comments:    Sargent Mankey L Cadince Hilscher 01/18/2024, 1:35 PM

## 2024-01-18 NOTE — Group Note (Signed)
 LCSW Group Therapy Note  Group Date: 01/18/2024 Start Time: 1315 End Time: 1400   Type of Therapy and Topic:  Group Therapy - Healthy vs Unhealthy Coping Skills  Participation Level:  Minimal   Description of Group The focus of this group was to determine what unhealthy coping techniques typically are used by group members and what healthy coping techniques would be helpful in coping with various problems. Patients were guided in becoming aware of the differences between healthy and unhealthy coping techniques. Patients were asked to identify 2-3 healthy coping skills they would like to learn to use more effectively.  Therapeutic Goals Patients learned that coping is what human beings do all day long to deal with various situations in their lives Patients defined and discussed healthy vs unhealthy coping techniques Patients identified their preferred coping techniques and identified whether these were healthy or unhealthy Patients determined 2-3 healthy coping skills they would like to become more familiar with and use more often. Patients provided support and ideas to each other   Summary of Patient Progress:  During group, Abby expressed feelings of depression in the past . Patient proved open to input from peers and feedback from CSW. Patient demonstrated fair insight into the subject matter, was respectful of peers, and participated throughout the entire session.   Therapeutic Modalities Cognitive Behavioral Therapy Motivational Interviewing  [name], [place] 01/18/2024  3:39 PM

## 2024-01-18 NOTE — Group Note (Signed)
 Date:  01/18/2024 Time:  9:23 PM  Group Topic/Focus:  Wrap-Up Group:   The focus of this group is to help patients review their daily goal of treatment and discuss progress on daily workbooks.    Participation Level:  Active  Participation Quality:  Appropriate  Affect:  Appropriate  Cognitive:  Alert  Insight: Appropriate  Engagement in Group:  Engaged  Modes of Intervention:  Discussion  Additional Comments:    James [name] Bunker 01/18/2024, 9:23 PM

## 2024-01-18 NOTE — Plan of Care (Signed)
   Problem: Education: Goal: Knowledge of [facility] Education information/materials will improve Outcome: Progressing Goal: Emotional status will improve Outcome: Progressing

## 2024-01-18 NOTE — [facility] Suicide Risk Assessment (Signed)
 [facility] Admission Suicide Risk Assessment   Nursing information obtained from:    Demographic factors:  Male, Age 66 or older, Divorced or widowed, Caucasian, Living alone Current Mental Status:  NA Loss Factors:  Loss of significant relationship Historical Factors:  NA Risk Reduction Factors:  NA  Total Time spent with patient: 30 minutes Principal Problem: MDD (major depressive disorder), recurrent severe, without psychosis (HCC) Diagnosis:  Principal Problem:   MDD (major depressive disorder), recurrent severe, without psychosis (HCC)  Subjective Data: Patient is a 68 year old male that presents this date voluntary to [facility] being brought in by his ex-wife after patient made statements to self-harm earlier this date. Patient denies any active plan or intent at the time of the assessment although stated earlier this date he had thoughts about overdosing on his medical medications or, do something else. Patient is admitted to [facility] unit with Q15 min safety monitoring. Multidisciplinary team approach is offered. Medication management; group/milieu therapy is offered.   Continued Clinical Symptoms:  Alcohol Use Disorder Identification Test Final Score (AUDIT): 0 The Alcohol Use Disorders Identification Test, Guidelines for Use in Primary Care, Second Edition.  World Science writer [facility]). Score between 0-7:  no or low risk or alcohol related problems. Score between 8-15:  moderate risk of alcohol related problems. Score between 16-19:  high risk of alcohol related problems. Score 20 or above:  warrants further diagnostic evaluation for alcohol dependence and treatment.   CLINICAL FACTORS:   Depression:   Impulsivity   Musculoskeletal: Strength & Muscle Tone: within normal limits Gait & Station: normal Patient leans: N/A  Psychiatric Specialty Exam:  Presentation  General Appearance:  Appropriate for Environment; Casual  Eye Contact: Fair  Speech: Clear and  Coherent  Speech Volume: Normal  Handedness: Right   Mood and Affect  Mood: Depressed; Dysphoric  Affect: Depressed; Flat   Thought Process  Thought Processes: Coherent  Descriptions of Associations:Intact  Orientation:Full (Time, Place and Person)  Thought Content:Logical  History of Schizophrenia/Schizoaffective disorder:No  Duration of Psychotic Symptoms:No data recorded Hallucinations:Hallucinations: None  Ideas of Reference:None  Suicidal Thoughts:Suicidal Thoughts: No  Homicidal Thoughts:Homicidal Thoughts: No   Sensorium  Memory: Immediate Fair; Remote Fair; Recent Fair  Judgment: Impaired  Insight: None   Executive Functions  Concentration: Fair  Attention Span: Fair  Recall: [employer] of Knowledge: Fair  Language: Fair   Psychomotor Activity  Psychomotor Activity: Psychomotor Activity: Normal   Assets  Assets: Communication Skills; Desire for Improvement; Resilience; Social Support   Sleep  Sleep: Sleep: Fair    Physical Exam: Physical Exam Vitals and nursing note reviewed.    ROS Blood pressure 125/86, pulse 70, temperature 97.7 F (36.5 C), resp. rate 17, height 5' 11 (1.803 m), weight 104.3 kg, SpO2 95%. Body mass index is 32.08 kg/m.   COGNITIVE FEATURES THAT CONTRIBUTE TO RISK:  None    SUICIDE RISK:   Mild:  Suicidal ideation of limited frequency, intensity, duration, and specificity.  There are no identifiable plans, no associated intent, mild dysphoria and related symptoms, good self-control (both objective and subjective assessment), few other risk factors, and identifiable protective factors, including available and accessible social support.  PLAN OF CARE: Patient is admitted to [facility] psych unit with Q15 min safety monitoring. Multidisciplinary team approach is offered. Medication management; group/milieu therapy is offered.   I certify that inpatient services furnished can reasonably be  expected to improve the patient's condition.   Allyn Foil, MD 01/18/2024, 3:44 PM

## 2024-01-18 NOTE — Progress Notes (Signed)
 Psychiatric Admission Assessment Adult  Patient Identification: James [name] MRN:  [id number] Date of Evaluation:  01/18/2024 Chief Complaint:  MDD (major depressive disorder), recurrent severe, without psychosis (HCC) [F33.2]   History of Present Illness: 68 y.o male that presents to [facility] ED with feelings of depression, anxiety and concern for SI. Reports that on 01/16/24 he was visiting his ex-wife and became upset which lead to him commenting about not wanting to live. He reports depression with feelings of sleep disturbance for the past 4 nights, decrease in appetite, lack of energy, and anhedonia. He has been dealing with these symptoms due to constraints from marital dispute, family issues, financial concerns and personal choices. He has been separated from his wife for three months due to a relationship months ago with a male he met online where he sent money to them and they stopped responding to him. He reports a new relationship with a different male he met online and he sent money too and that she was coming to live with him but then decided to stay with her sister instead. He feels that interaction could have added to his depression. He is currently living alone in an apartment but visits his wife and 2 sons. States an unstable relationship with one of his sons since he and his wife separated. Currently denies SI, but also expressed during some time in his depression he thought of different plans of suicide, mentions overdose with pills. Denies HI, visual or audible hallucinations or grandiose delusions. Denies history of physical or sexual abuse and denies recurrent flashbacks or nightmares. Denies any family history of mental illness or suicide. Denies alcohol, tobacco, or illegal drug use.   Past psychiatric history for depression and he was prescribed Cymbalta . Denies history of SI or attempts of suicide. He does not have a mental health provider.  Total Time spent with patient:  1 hour Sleep  Sleep: Decreased Past Psychiatric History: Depression Psychiatric History: Depression Information collected from Exam and chart review  Prev Dx/Sx: HTN, DMT2, CAD, NASH cirrhosis Current Psych Provider: None currently Home Meds (current): Cymbalta  60mg , Klonopin  0.5mg , Gabapentin  600mg  BID, Amlodipine  5mg , Lipitor  80mg , coreg  12.5mg  BID, Jardiance , 25mg , Humalog  60 units, metformin  500mg  BID Previous Med Trials: N/A Therapy: N/A  Prior Psych Hospitalization: N  Prior Self Harm: No Prior Violence: No  Family Psych History: None Family Hx suicide: None  Social History:  Developmental Hx: None Educational Hx: HS graduate Occupational Hx: disabled Legal Hx: None Living Situation: Alone, apartment Spiritual Hx: N/A Access to weapons/lethal means: No   Substance History Alcohol: No  Type of alcohol -- Last Drink -- Number of drinks per day -- History of alcohol withdrawal seizures -- History of DT's -- Tobacco: No Illicit drugs: No Prescription drug abuse: No Rehab hx: No Is the patient at risk to self? Yes. Has the patient been a risk to self in the past 6 months? Yes.    Has the patient been a risk to self within the distant past? No.  Is the patient a risk to others? No.  Has the patient been a risk to others in the past 6 months? No.  Has the patient been a risk to others within the distant past? No.   [place] Scale:  Flowsheet Row Admission (Current) from 01/17/2024 in [facility] BE[facility] ED from 01/16/2024 in [facility] Emergency Department at [facility] ED from 12/28/2023 in [facility] Emergency Department at [facility]  C-SSRS RISK CATEGORY  Moderate Risk Low Risk No Risk     Past Medical History:  Past Medical History:  Diagnosis Date   Abnormal cardiac CT angiography    Acid reflux    Annual physical exam 04/08/2015   Arthritis    Atypical chest pain 06/13/2020   Blood transfusion without reported diagnosis     Body mass index (BMI) 35.0-35.9, adult 04/05/2019   Chronic fatigue 01/28/2015   Chronic headaches    on cymbalta    Chronic kidney disease, stage 3b (HCC) 07/14/2023   Chronic migraine w/o aura, not intractable, w/o stat migr 10/24/2018   Cirrhosis (HCC)    Colon polyps    Complication of anesthesia    problems waking up from anesthesia   Coronary artery disease 01/23/2019   Depression    on cymbalta    Diabetes mellitus with neuropathy (HCC)    Diabetes with neuropathy 04/25/2013   Diverticulitis 03/2013   Dyslipidemia 05/29/2019   Eczema    Elevated LFTs    Epidural lipomatosis 10/05/2018   Essential (primary) hypertension 04/05/2019   Essential hypertension 10/10/2019   Fatty liver    GERD (gastroesophageal reflux disease) 04/28/2011   H/O craniotomy 05/07/2015   Headache 04/28/2011   Hepatitis 10/2017   NASH cirrhosis   History of kidney stones    Hyperlipidemia    Hypersomnia with sleep apnea 01/28/2015   Hypertension    IDA (iron  deficiency anemia) 01/24/2019   Idiopathic intracranial hypertension 01/14/2017   Insomnia 04/26/2013   Kidney stone    Liver cirrhosis secondary to NASH (nonalcoholic steatohepatitis) (HCC) 01/02/2016   Low back pain 04/05/2019   Lower back injury 08/14/2019   Morbid obesity (HCC)    Neuromuscular disorder (HCC)    neuropathy   Neuropathy    Nonalcoholic steatohepatitis 10/05/2018   Obstructive hydrocephalus (HCC) 01/28/2015   OSA -- dx ~ 2012, cpap intolerant 09/04/2014    dx ~ 2012, cpap intolerant    PCP NOTES >>> 04/08/2015   Post-op pain 03/19/2019   Post-traumatic hydrocephalus (HCC)    s/p shunts x 2 (first got infected )   Presence of cerebrospinal fluid drainage device 07/28/2011   Psoriasis    sees Dr Leanord   Psoriatic arthritis (HCC)    REM behavioral disorder 01/14/2017   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   see's Dr.Ortmann   Severe obesity (BMI >= 40) (HCC) 01/28/2015   SI (sacroiliac)  joint dysfunction 08/14/2019   Sigmoid diverticulitis 04/25/2013   Sleep apnea    no CPAP      Spondylolisthesis, lumbar region 03/16/2019   Stomach ulcer    Testosterone  deficiency 04/28/2011   VP (ventriculoperitoneal) shunt status 07/31/2020    Past Surgical History:  Procedure Laterality Date   AMPUTATION Left 12/27/2021   Procedure: AMPUTATION GREAT TOE;  Surgeon: Gershon Donnice SAUNDERS, DPM;  Location: [facility] OR;  Service: Podiatry;  Laterality: Left;   BACK SURGERY  1980   BRAIN SURGERY     VP shunts placed in 2007   CHOLECYSTECTOMY N/A 08/25/2017   Procedure: LAPAROSCOPIC CHOLECYSTECTOMY WITH INTRAOPERATIVE CHOLANGIOGRAM;  Surgeon: Curvin Deward MOULD, MD;  Location: [facility] OR;  Service: General;  Laterality: N/A;   COLONOSCOPY     CORONARY STENT INTERVENTION N/A 09/27/2020   Procedure: CORONARY STENT INTERVENTION;  Surgeon: Dann Candyce RAMAN, MD;  Location: [facility] INVASIVE CV LAB;  Service: Cardiovascular;  Laterality: N/A;   CORONARY ULTRASOUND/IVUS N/A 09/27/2020   Procedure: Intravascular Ultrasound/IVUS;  Surgeon: Dann Candyce RAMAN, MD;  Location: [facility] INVASIVE CV  LAB;  Service: Cardiovascular;  Laterality: N/A;   JOINT REPLACEMENT     total hip   LEFT HEART CATH N/A 09/27/2020   Procedure: Left Heart Cath;  Surgeon: Dann Candyce RAMAN, MD;  Location: [facility] INVASIVE CV LAB;  Service: Cardiovascular;  Laterality: N/A;   LEFT HEART CATH AND CORONARY ANGIOGRAPHY N/A 09/24/2020   Procedure: LEFT HEART CATH AND CORONARY ANGIOGRAPHY;  Surgeon: Dann Candyce RAMAN, MD;  Location: [facility] INVASIVE CV LAB;  Service: Cardiovascular;  Laterality: N/A;   LEFT HEART CATH AND CORONARY ANGIOGRAPHY N/A 08/18/2021   Procedure: LEFT HEART CATH AND CORONARY ANGIOGRAPHY;  Surgeon: Burnard Debby LABOR, MD;  Location: [facility] INVASIVE CV LAB;  Service: Cardiovascular;  Laterality: N/A;   LUMBAR FUSION  03/16/2019   SHOULDER SURGERY Left 2010   TEE WITHOUT CARDIOVERSION N/A 01/01/2022   Procedure: TRANSESOPHAGEAL ECHOCARDIOGRAM (TEE);   Surgeon: Sheena Pugh, DO;  Location: [facility] ENDOSCOPY;  Service: Cardiovascular;  Laterality: N/A;   TOE SURGERY Left 2018   TONSILLECTOMY     as a child   TOTAL HIP ARTHROPLASTY Left 2011   UPPER GASTROINTESTINAL ENDOSCOPY  01/04/2020   VENTRICULOPERITONEAL SHUNT  2007   x2   Family History:  Family History  Problem Relation Age of Onset   Healthy Mother    Lung cancer Father        former smoker   Heart disease Brother        MI age 52   Other Brother        Murdered   Down syndrome Son    Diabetes Neg Hx    Prostate cancer Neg Hx    Colon cancer Neg Hx    Stomach cancer Neg Hx    Pancreatic cancer Neg Hx    Liver disease Neg Hx     Social History:  Social History   Substance and Sexual Activity  Alcohol Use No     Social History   Substance and Sexual Activity  Drug Use No      Allergies:   Allergies  Allergen Reactions   Bee Venom Anaphylaxis   Hydrocodone  Bit-Homatrop Mbr Other (See Comments)    Hallucinations, confusion, delirium Depressed feeling   Toradol  [Ketorolac  Tromethamine ] Other (See Comments)    Hallucinations, confusion, delirium   Prednisone     Patient reports it causes cirrhosis to flare up   Sulfadiazine     NDC Rniz:[id number] NDC Rniz:[id number] NDC Rniz:[id number]   Morphine And Codeine Other (See Comments)    Hallucinations, back in the 80s. States has taken vicodin before w/o problems    Sulfa Drugs Cross Reactors Rash   Lab Results:  Results for orders placed or performed during the hospital encounter of 01/17/24 (from the past 48 hours)  Glucose, capillary     Status: Abnormal   Collection Time: 01/17/24  5:14 PM  Result Value Ref Range   Glucose-Capillary 171 (H) 70 - 99 mg/dL    Comment: Glucose reference range applies only to samples taken after fasting for at least 8 hours.  Glucose, capillary     Status: Abnormal   Collection Time: 01/17/24  7:28 PM  Result Value Ref Range   Glucose-Capillary 131 (H) 70 - 99 mg/dL     Comment: Glucose reference range applies only to samples taken after fasting for at least 8 hours.  Glucose, capillary     Status: Abnormal   Collection Time: 01/18/24 11:31 AM  Result Value Ref Range   Glucose-Capillary 190 (H) 70 - 99  mg/dL    Comment: Glucose reference range applies only to samples taken after fasting for at least 8 hours.   *Note: Due to a large number of results and/or encounters for the requested time period, some results have not been displayed. A complete set of results can be found in Results Review.    Blood Alcohol level:  Lab Results  Component Value Date   [facility] <15 01/16/2024    Metabolic Disorder Labs:  Lab Results  Component Value Date   HGBA1C 9.1 (A) 10/19/2023   MPG 139.85 08/15/2021   MPG 148.46 09/27/2020   Lab Results  Component Value Date   PROLACTIN 8.7 10/10/2014   Lab Results  Component Value Date   CHOL 132 01/20/2023   TRIG 145 08/20/2023   HDL 41.00 01/20/2023   CHOLHDL 3 01/20/2023   VLDL 48.0 (H) 01/20/2023   LDLCALC 68 01/30/2022   LDLCALC 55 08/16/2021    Current Medications: Current Facility-Administered Medications  Medication Dose Route Frequency Provider Last Rate Last Admin   acetaminophen  (TYLENOL ) tablet 650 mg  650 mg Oral Q6H PRN Arloa Suzen RAMAN, NP   650 mg at 01/18/24 1221   alum & mag hydroxide-simeth (MAALOX/MYLANTA) 200-200-20 MG/5ML suspension 30 mL  30 mL Oral Q4H PRN Arloa Suzen RAMAN, NP       amLODipine  (NORVASC ) tablet 5 mg  5 mg Oral Daily Arloa Suzen RAMAN, NP   5 mg at 01/18/24 9047   aspirin  chewable tablet 81 mg  81 mg Oral Daily Arloa Suzen RAMAN, NP   81 mg at 01/18/24 9046   atorvastatin  (LIPITOR ) tablet 80 mg  80 mg Oral QHS Arloa Suzen RAMAN, NP   80 mg at 01/17/24 2126   carvedilol  (COREG ) tablet 12.5 mg  12.5 mg Oral BID WC Arloa Suzen RAMAN, NP   12.5 mg at 01/18/24 9046   clonazePAM  (KLONOPIN ) tablet 0.5 mg  0.5 mg Oral QHS Arloa Suzen RAMAN, NP   0.5 mg at 01/17/24 2126    dicyclomine  (BENTYL ) capsule 20 mg  20 mg Oral BID PRN Arloa Suzen RAMAN, NP       haloperidol  (HALDOL ) tablet 5 mg  5 mg Oral TID PRN Arloa Suzen RAMAN, NP       And   diphenhydrAMINE  (BENADRYL ) capsule 50 mg  50 mg Oral TID PRN Arloa Suzen RAMAN, NP       haloperidol  lactate (HALDOL ) injection 5 mg  5 mg Intramuscular TID PRN Arloa Suzen RAMAN, NP       And   diphenhydrAMINE  (BENADRYL ) injection 50 mg  50 mg Intramuscular TID PRN Arloa Suzen RAMAN, NP       And   LORazepam  (ATIVAN ) injection 2 mg  2 mg Intramuscular TID PRN Arloa Suzen RAMAN, NP       haloperidol  lactate (HALDOL ) injection 10 mg  10 mg Intramuscular TID PRN Arloa Suzen RAMAN, NP       And   diphenhydrAMINE  (BENADRYL ) injection 50 mg  50 mg Intramuscular TID PRN Arloa Suzen RAMAN, NP       And   LORazepam  (ATIVAN ) injection 2 mg  2 mg Intramuscular TID PRN Arloa Suzen RAMAN, NP       DULoxetine  (CYMBALTA ) DR capsule 120 mg  120 mg Oral Daily Arloa Suzen RAMAN, NP   120 mg at 01/18/24 9047   empagliflozin  (JARDIANCE ) tablet 25 mg  25 mg Oral Q breakfast Arloa Suzen RAMAN, NP   25 mg at 01/18/24 0952   feeding  supplement (ENSURE PLUS HIGH PROTEIN) liquid 237 mL  237 mL Oral BID BM Jadapalle, Sree, MD   237 mL at 01/18/24 1219   gabapentin  (NEURONTIN ) capsule 600 mg  600 mg Oral BID Arloa Suzen RAMAN, NP   600 mg at 01/18/24 9046   hydrOXYzine  (ATARAX ) tablet 25 mg  25 mg Oral TID PRN Arloa Suzen RAMAN, NP   25 mg at 01/18/24 1417   insulin  aspart (novoLOG ) injection 0-15 Units  0-15 Units Subcutaneous TID WC Arloa Suzen RAMAN, NP   3 Units at 01/18/24 1218   isosorbide  mononitrate (IMDUR ) 24 hr tablet 120 mg  120 mg Oral Daily Arloa Suzen RAMAN, NP   120 mg at 01/18/24 9047   magnesium  hydroxide (MILK OF MAGNESIA) suspension 30 mL  30 mL Oral Daily PRN Arloa Suzen RAMAN, NP       melatonin tablet 5 mg  5 mg Oral QHS Arloa Suzen RAMAN, NP   5 mg at 01/17/24 2141   metFORMIN  (GLUCOPHAGE ) tablet 500 mg  500 mg  Oral BID Arloa Suzen RAMAN, NP   500 mg at 01/18/24 9046   pantoprazole  (PROTONIX ) EC tablet 40 mg  40 mg Oral Daily Arloa Suzen RAMAN, NP   40 mg at 01/18/24 9046   traZODone  (DESYREL ) tablet 50 mg  50 mg Oral QHS PRN Arloa Suzen RAMAN, NP   50 mg at 01/17/24 2126   PTA Medications: Facility-Administered Medications Prior to Admission  Medication Dose Route Frequency Provider Last Rate Last Admin   botulinum toxin Type A  (BOTOX ) injection 155 Units  155 Units Intramuscular Once Sater, Charlie LABOR, MD       Medications Prior to Admission  Medication Sig Dispense Refill Last Dose/Taking   acetaminophen  (TYLENOL ) 325 MG tablet Take 2 tablets (650 mg total) by mouth every 4 (four) hours as needed for headache or mild pain.      amLODipine  (NORVASC ) 5 MG tablet Take 1 tablet (5 mg total) by mouth daily. 90 tablet 2    aspirin  81 MG chewable tablet Chew 1 tablet (81 mg total) by mouth daily.      atorvastatin  (LIPITOR ) 80 MG tablet Take 1 tablet (80 mg total) by mouth at bedtime. 90 tablet 1    botulinum toxin Type A  (BOTOX ) 200 units injection Provider to inject 155 units into the muscles of the head and neck every 3 months. Discard remainder. (Patient taking differently: Inject 200 Units into the muscle every 3 (three) months. Provider to inject 155 units into the muscles of the head and neck every 3 months. Discard remainder.) 1 each 2    carvedilol  (COREG ) 12.5 MG tablet Take 1 tablet (12.5 mg total) by mouth 2 (two) times daily with a meal. 180 tablet 1    clonazePAM  (KLONOPIN ) 0.5 MG tablet Take 1 tablet (0.5 mg total) by mouth at bedtime. 30 tablet 5    Continuous Glucose Sensor (DEXCOM G6 SENSOR) MISC Use to monitor blood sugar, change after 10 days (Patient taking differently: 1 each by Other route See admin instructions. Use to monitor blood sugar, change after 10 days) 9 each 3    Continuous Glucose Transmitter (DEXCOM G6 TRANSMITTER) MISC Use as directed for 90 days 1 each 3     dicyclomine  (BENTYL ) 20 MG tablet Take 1 tablet (20 mg total) by mouth 2 (two) times daily as needed. (Patient taking differently: Take 20 mg by mouth 2 (two) times daily as needed for spasms.) 10 tablet 0    DULoxetine  (  CYMBALTA ) 60 MG capsule Take 2 capsules (120 mg total) by mouth daily. 180 capsule 1    empagliflozin  (JARDIANCE ) 25 MG TABS tablet Take 1 tablet (25 mg total) by mouth daily with breakfast. 90 tablet 3    EPINEPHrine  (EPIPEN  2-PAK) 0.3 mg/0.3 mL IJ SOAJ injection Inject 0.3 mLs (0.3 mg total) into the muscle once as needed for up to 1 dose for anaphylaxis. 2 each 2    gabapentin  (NEURONTIN ) 600 MG tablet Take 1 tablet (600 mg total) by mouth 2 (two) times daily. 180 tablet 1    Insulin  Disposable Pump (OMNIPOD 5 G6 PODS, GEN 5,) MISC Use as directed every 3 (three) days. 10 each 3    insulin  lispro (HUMALOG ) 100 UNIT/ML injection Use up to 60 Units daily in pump 20 mL 2    Insulin  Syringe-Needle U-100 (INSULIN  SYRINGE 1CC/31GX5/16) 31G X 5/16 1 ML MISC Use to administer Humalog  3 times a day (Patient taking differently: 1 tablet by Other route See admin instructions. Use to administer Humalog  3 times a day) 100 each 0    isosorbide  mononitrate (IMDUR ) 120 MG 24 hr tablet Take 1 tablet (120 mg total) by mouth daily. 90 tablet 2    metFORMIN  (GLUCOPHAGE ) 500 MG tablet Take 1 tablet (500 mg total) by mouth 2 (two) times daily. 180 tablet 3    mupirocin  ointment (BACTROBAN ) 2 % Apply 1 Application topically 2 (two) times daily. 22 g 0    nitroGLYCERIN  (NITROSTAT ) 0.4 MG SL tablet Place 1 tablet (0.4 mg total) under the tongue every 5 (five) minutes for 3 doses as needed for chest pain. 25 tablet 6    ondansetron  (ZOFRAN -ODT) 4 MG disintegrating tablet Take 1 tablet (4 mg total) by mouth every 8 (eight) hours as needed. 20 tablet 0    pantoprazole  (PROTONIX ) 40 MG tablet Take 1 tablet (40 mg total) by mouth daily. 90 tablet 1    SKYRIZI PEN 150 MG/ML pen As directed       Psychiatric  Specialty Exam:  Presentation  General Appearance: Appears age appropriate Eye Contact: Maintains appropriate eye contact, no fleeting Speech: Normal rate Speech Volume: Fair   Mood and Affect  Mood: Depressed, Anxious Affect: Blunted  Thought Process  Thought Processes:Linear Descriptions of Associations: Normal Orientation: x3 Thought Content: Guilt, worthlessness, suicidal ideation Hallucinations: Denies Ideas of Reference: Denied Suicidal Thoughts: Overdose with pills Homicidal Thoughts: Denies  Sensorium  Memory: Intact Judgment: Poor Insight: Partial  Executive Functions  Concentration: Fair Attention Span: Fair Recall: Intact Fund of Knowledge: Fair Language: Fair  Psychomotor Activity  Psychomotor Activity: Agitated  Assets  Assets:   Musculoskeletal: Strength & Muscle Tone: within normal limits Gait & Station: normal  Physical Exam: Physical Exam Vitals reviewed.  Constitutional:      General: He is not in acute distress.    Appearance: Normal appearance. He is not ill-appearing or diaphoretic.  HENT:     Head: Normocephalic.  Eyes:     Extraocular Movements: Extraocular movements intact.     Pupils: Pupils are equal, round, and reactive to light.  Pulmonary:     Effort: Pulmonary effort is normal. No respiratory distress.  Skin:    Coloration: Skin is not pale.  Neurological:     General: No focal deficit present.     Mental Status: He is alert and oriented to person, place, and time.  Psychiatric:        Mood and Affect: Mood is anxious and depressed.  Behavior: Behavior is actively hallucinating.        Thought Content: Thought content includes suicidal ideation.    Review of Systems  Constitutional:  Positive for malaise/fatigue.  Cardiovascular:  Negative for palpitations.  Psychiatric/Behavioral:  Positive for depression and suicidal ideas. Negative for hallucinations. The patient is nervous/anxious and has insomnia.   All  other systems reviewed and are negative.  Blood pressure 125/86, pulse 70, temperature 97.7 F (36.5 C), resp. rate 17, height 5' 11 (1.803 m), weight 104.3 kg, SpO2 95%. Body mass index is 32.08 kg/m.  Principal Diagnosis: MDD (major depressive disorder), recurrent severe, without psychosis (HCC) Diagnosis:  Principal Problem:   MDD (major depressive disorder), recurrent severe, without psychosis (HCC)   Clinical Decision Making:  Treatment Plan Summary:   Safety and Monitoring:             -- Voluntary admission to inpatient psychiatric unit for safety, stabilization and treatment             -- Daily contact with patient to assess and evaluate symptoms and progress in treatment             -- Patient's case to be discussed in multi-disciplinary team meeting             -- Observation Level: q15 minute checks             -- Vital signs:  q12 hours             -- Precautions: suicide, elopement, and assault   2. Psychiatric Diagnoses and Treatment:       MDD severe, suicidal ideation  - Increase Cymbalta  to 120mg  and continue Klonopin  0.5mg , Discussed options for sleep disturbance if needed.      -- The risks/benefits/side-effects/alternatives to this medication were discussed in detail with the patient and time was given for questions. The patient consents to medication trial.                -- Metabolic profile and EKG monitoring obtained while on an atypical antipsychotic (BMI: Lipid Panel: HbgA1c: QTc:)              -- Encouraged patient to participate in unit milieu and in scheduled group therapies                            3. Medical Issues Being Addressed:    MDD, suicidal ideation,   4. Discharge Planning:              -- Social work and case management to assist with discharge planning and identification of hospital follow-up needs prior to discharge             -- Estimated LOS: 5-7 days             -- Discharge Concerns: Need to establish a safety plan;  Medication compliance and effectiveness             -- Discharge Goals: Return home with outpatient referrals follow ups  Physician Treatment Plan for Primary Diagnosis: MDD (major depressive disorder), recurrent severe, without psychosis (HCC) Long Term Goal(s): Improvement in symptoms so as ready for discharge  Short Term Goals: Ability to identify changes in lifestyle to reduce recurrence of condition will improve, Ability to disclose and discuss suicidal ideas, Ability to demonstrate self-control will improve, Ability to identify and develop effective coping behaviors will improve, and Ability to identify triggers associated with  substance abuse/mental health issues will improve  Physician Treatment Plan for Secondary Diagnosis: Principal Problem:   MDD (major depressive disorder), recurrent severe, without psychosis (HCC)  Long Term Goal(s): Improvement in symptoms so as ready for discharge  Short Term Goals: Ability to disclose and discuss suicidal ideas, Ability to demonstrate self-control will improve, and Ability to identify and develop effective coping behaviors will improve  I certify that inpatient services furnished can reasonably be expected to improve the patient's condition.    HILLARY Hummer, Student-PA 7/22/20253:26 PM

## 2024-01-18 NOTE — Group Note (Signed)
 Date:  01/18/2024 Time:  9:29 PM  Group Topic/Focus:  Wrap-Up Group:   The focus of this group is to help patients review their daily goal of treatment and discuss progress on daily workbooks.    Participation Level:  Active  Participation Quality:  Appropriate  Affect:  Appropriate  Cognitive:  Alert  Insight: Appropriate  Engagement in Group:  Engaged  Modes of Intervention:  Discussion  Additional Comments:    James [name] 01/18/2024, 9:29 PM

## 2024-01-19 DIAGNOSIS — F332 Major depressive disorder, recurrent severe without psychotic features: Secondary | ICD-10-CM | POA: Diagnosis not present

## 2024-01-19 LAB — GLUCOSE, CAPILLARY
Glucose-Capillary: 141 mg/dL — ABNORMAL HIGH (ref 70–99)
Glucose-Capillary: 211 mg/dL — ABNORMAL HIGH (ref 70–99)
Glucose-Capillary: 117 mg/dL — ABNORMAL HIGH (ref 70–99)
Glucose-Capillary: 152 mg/dL — ABNORMAL HIGH (ref 70–99)

## 2024-01-19 MED ORDER — DIVALPROEX SODIUM ER 500 MG PO TB24
500.0000 mg | ORAL_TABLET | Freq: Every day | ORAL | Status: DC
  Administered 2024-01-19 – 2024-01-24 (×6): 500 mg via ORAL
  Filled 2024-01-19 (×6): qty 1

## 2024-01-19 NOTE — BH IP Treatment Plan (Signed)
 Interdisciplinary Treatment and Diagnostic Plan Update  01/19/2024 Time of Session: 10:21 AM  James [name] MRN: [id number]  Principal Diagnosis: MDD (major depressive disorder), recurrent severe, without psychosis (HCC)  Secondary Diagnoses: Principal Problem:   MDD (major depressive disorder), recurrent severe, without psychosis (HCC)   Current Medications:  Current Facility-Administered Medications  Medication Dose Route Frequency Provider Last Rate Last Admin   acetaminophen  (TYLENOL ) tablet 650 mg  650 mg Oral Q6H PRN Arloa Suzen RAMAN, NP   650 mg at 01/19/24 1124   alum & mag hydroxide-simeth (MAALOX/MYLANTA) 200-200-20 MG/5ML suspension 30 mL  30 mL Oral Q4H PRN Arloa Suzen RAMAN, NP       amLODipine  (NORVASC ) tablet 5 mg  5 mg Oral Daily Arloa Suzen RAMAN, NP   5 mg at 01/19/24 0935   aspirin  chewable tablet 81 mg  81 mg Oral Daily Arloa Suzen RAMAN, NP   81 mg at 01/19/24 9066   atorvastatin  (LIPITOR ) tablet 80 mg  80 mg Oral QHS Arloa Suzen RAMAN, NP   80 mg at 01/18/24 2138   carvedilol  (COREG ) tablet 12.5 mg  12.5 mg Oral BID WC Arloa Suzen RAMAN, NP   12.5 mg at 01/19/24 9066   clonazePAM  (KLONOPIN ) tablet 0.5 mg  0.5 mg Oral QHS Arloa Suzen RAMAN, NP   0.5 mg at 01/18/24 2138   dicyclomine  (BENTYL ) capsule 20 mg  20 mg Oral BID PRN Arloa Suzen RAMAN, NP       haloperidol  (HALDOL ) tablet 5 mg  5 mg Oral TID PRN Arloa Suzen RAMAN, NP       And   diphenhydrAMINE  (BENADRYL ) capsule 50 mg  50 mg Oral TID PRN Arloa Suzen RAMAN, NP       haloperidol  lactate (HALDOL ) injection 5 mg  5 mg Intramuscular TID PRN Arloa Suzen RAMAN, NP       And   diphenhydrAMINE  (BENADRYL ) injection 50 mg  50 mg Intramuscular TID PRN Arloa Suzen RAMAN, NP       And   LORazepam  (ATIVAN ) injection 2 mg  2 mg Intramuscular TID PRN Arloa Suzen RAMAN, NP       haloperidol  lactate (HALDOL ) injection 10 mg  10 mg Intramuscular TID PRN Arloa Suzen RAMAN, NP       And   diphenhydrAMINE   (BENADRYL ) injection 50 mg  50 mg Intramuscular TID PRN Arloa Suzen RAMAN, NP       And   LORazepam  (ATIVAN ) injection 2 mg  2 mg Intramuscular TID PRN Arloa Suzen RAMAN, NP       DULoxetine  (CYMBALTA ) DR capsule 120 mg  120 mg Oral Daily Arloa Suzen RAMAN, NP   120 mg at 01/19/24 9066   empagliflozin  (JARDIANCE ) tablet 25 mg  25 mg Oral Q breakfast Arloa Suzen RAMAN, NP   25 mg at 01/19/24 0935   feeding supplement (ENSURE PLUS HIGH PROTEIN) liquid 237 mL  237 mL Oral BID BM Jadapalle, Sree, MD   237 mL at 01/18/24 1219   gabapentin  (NEURONTIN ) capsule 600 mg  600 mg Oral BID Arloa Suzen RAMAN, NP   600 mg at 01/19/24 0935   hydrOXYzine  (ATARAX ) tablet 25 mg  25 mg Oral TID PRN Arloa Suzen RAMAN, NP   25 mg at 01/18/24 1417   insulin  aspart (novoLOG ) injection 0-15 Units  0-15 Units Subcutaneous TID WC Arloa Suzen RAMAN, NP   11 Units at 01/19/24 1126   isosorbide  mononitrate (IMDUR ) 24 hr tablet 120 mg  120 mg Oral  Daily Arloa Suzen RAMAN, NP   120 mg at 01/19/24 9065   magnesium  hydroxide (MILK OF MAGNESIA) suspension 30 mL  30 mL Oral Daily PRN Arloa Suzen RAMAN, NP       melatonin tablet 5 mg  5 mg Oral QHS Arloa Suzen RAMAN, NP   5 mg at 01/18/24 2138   metFORMIN  (GLUCOPHAGE ) tablet 500 mg  500 mg Oral BID Arloa Suzen RAMAN, NP   500 mg at 01/19/24 9065   mupirocin  ointment (BACTROBAN ) 2 % 1 Application  1 Application Topical BID Jadapalle, Sree, MD   1 Application at 01/19/24 9062   pantoprazole  (PROTONIX ) EC tablet 40 mg  40 mg Oral Daily Arloa Suzen RAMAN, NP   40 mg at 01/19/24 0935   traZODone  (DESYREL ) tablet 50 mg  50 mg Oral QHS PRN Arloa Suzen RAMAN, NP   50 mg at 01/18/24 2138   PTA Medications: Facility-Administered Medications Prior to Admission  Medication Dose Route Frequency Provider Last Rate Last Admin   botulinum toxin Type A  (BOTOX ) injection 155 Units  155 Units Intramuscular Once Sater, Charlie LABOR, MD       Medications Prior to Admission  Medication Sig  Dispense Refill Last Dose/Taking   acetaminophen  (TYLENOL ) 325 MG tablet Take 2 tablets (650 mg total) by mouth every 4 (four) hours as needed for headache or mild pain.      amLODipine  (NORVASC ) 5 MG tablet Take 1 tablet (5 mg total) by mouth daily. 90 tablet 2    aspirin  81 MG chewable tablet Chew 1 tablet (81 mg total) by mouth daily.      atorvastatin  (LIPITOR ) 80 MG tablet Take 1 tablet (80 mg total) by mouth at bedtime. 90 tablet 1    botulinum toxin Type A  (BOTOX ) 200 units injection Provider to inject 155 units into the muscles of the head and neck every 3 months. Discard remainder. (Patient taking differently: Inject 200 Units into the muscle every 3 (three) months. Provider to inject 155 units into the muscles of the head and neck every 3 months. Discard remainder.) 1 each 2    carvedilol  (COREG ) 12.5 MG tablet Take 1 tablet (12.5 mg total) by mouth 2 (two) times daily with a meal. 180 tablet 1    clonazePAM  (KLONOPIN ) 0.5 MG tablet Take 1 tablet (0.5 mg total) by mouth at bedtime. 30 tablet 5    Continuous Glucose Sensor (DEXCOM G6 SENSOR) MISC Use to monitor blood sugar, change after 10 days (Patient taking differently: 1 each by Other route See admin instructions. Use to monitor blood sugar, change after 10 days) 9 each 3    Continuous Glucose Transmitter (DEXCOM G6 TRANSMITTER) MISC Use as directed for 90 days 1 each 3    dicyclomine  (BENTYL ) 20 MG tablet Take 1 tablet (20 mg total) by mouth 2 (two) times daily as needed. (Patient taking differently: Take 20 mg by mouth 2 (two) times daily as needed for spasms.) 10 tablet 0    DULoxetine  (CYMBALTA ) 60 MG capsule Take 2 capsules (120 mg total) by mouth daily. 180 capsule 1    empagliflozin  (JARDIANCE ) 25 MG TABS tablet Take 1 tablet (25 mg total) by mouth daily with breakfast. 90 tablet 3    EPINEPHrine  (EPIPEN  2-PAK) 0.3 mg/0.3 mL IJ SOAJ injection Inject 0.3 mLs (0.3 mg total) into the muscle once as needed for up to 1 dose for  anaphylaxis. 2 each 2    gabapentin  (NEURONTIN ) 600 MG tablet Take 1 tablet (600 mg  total) by mouth 2 (two) times daily. 180 tablet 1    Insulin  Disposable Pump (OMNIPOD 5 G6 PODS, GEN 5,) MISC Use as directed every 3 (three) days. 10 each 3    insulin  lispro (HUMALOG ) 100 UNIT/ML injection Use up to 60 Units daily in pump 20 mL 2    Insulin  Syringe-Needle U-100 (INSULIN  SYRINGE 1CC/31GX5/16) 31G X 5/16 1 ML MISC Use to administer Humalog  3 times a day (Patient taking differently: 1 tablet by Other route See admin instructions. Use to administer Humalog  3 times a day) 100 each 0    isosorbide  mononitrate (IMDUR ) 120 MG 24 hr tablet Take 1 tablet (120 mg total) by mouth daily. 90 tablet 2    metFORMIN  (GLUCOPHAGE ) 500 MG tablet Take 1 tablet (500 mg total) by mouth 2 (two) times daily. 180 tablet 3    mupirocin  ointment (BACTROBAN ) 2 % Apply 1 Application topically 2 (two) times daily. 22 g 0    nitroGLYCERIN  (NITROSTAT ) 0.4 MG SL tablet Place 1 tablet (0.4 mg total) under the tongue every 5 (five) minutes for 3 doses as needed for chest pain. 25 tablet 6    ondansetron  (ZOFRAN -ODT) 4 MG disintegrating tablet Take 1 tablet (4 mg total) by mouth every 8 (eight) hours as needed. 20 tablet 0    pantoprazole  (PROTONIX ) 40 MG tablet Take 1 tablet (40 mg total) by mouth daily. 90 tablet 1    SKYRIZI PEN 150 MG/ML pen As directed       Patient Stressors: Marital or family conflict    Patient Strengths: Ability for insight  Active sense of humor  Communication skills   Treatment Modalities: Medication Management, Group therapy, Case management,  1 to 1 session with clinician, Psychoeducation, Recreational therapy.   Physician Treatment Plan for Primary Diagnosis: MDD (major depressive disorder), recurrent severe, without psychosis (HCC) Long Term Goal(s): Improvement in symptoms so as ready for discharge   Short Term Goals: Ability to identify changes in lifestyle to reduce recurrence of  condition will improve Ability to verbalize feelings will improve Ability to disclose and discuss suicidal ideas Ability to demonstrate self-control will improve Ability to identify and develop effective coping behaviors will improve  Medication Management: Evaluate patient's response, side effects, and tolerance of medication regimen.  Therapeutic Interventions: 1 to 1 sessions, Unit Group sessions and Medication administration.  Evaluation of Outcomes: Not Progressing  Physician Treatment Plan for Secondary Diagnosis: Principal Problem:   MDD (major depressive disorder), recurrent severe, without psychosis (HCC)  Long Term Goal(s): Improvement in symptoms so as ready for discharge   Short Term Goals: Ability to identify changes in lifestyle to reduce recurrence of condition will improve Ability to verbalize feelings will improve Ability to disclose and discuss suicidal ideas Ability to demonstrate self-control will improve Ability to identify and develop effective coping behaviors will improve     Medication Management: Evaluate patient's response, side effects, and tolerance of medication regimen.  Therapeutic Interventions: 1 to 1 sessions, Unit Group sessions and Medication administration.  Evaluation of Outcomes: Not Progressing   RN Treatment Plan for Primary Diagnosis: MDD (major depressive disorder), recurrent severe, without psychosis (HCC) Long Term Goal(s): Knowledge of disease and therapeutic regimen to maintain health will improve  Short Term Goals: Ability to remain free from injury will improve, Ability to verbalize frustration and anger appropriately will improve, Ability to demonstrate self-control, Ability to participate in decision making will improve, Ability to verbalize feelings will improve, Ability to disclose and discuss suicidal ideas, Ability  to identify and develop effective coping behaviors will improve, and Compliance with prescribed medications will  improve  Medication Management: RN will administer medications as ordered by provider, will assess and evaluate patient's response and provide education to patient for prescribed medication. RN will report any adverse and/or side effects to prescribing provider.  Therapeutic Interventions: 1 on 1 counseling sessions, Psychoeducation, Medication administration, Evaluate responses to treatment, Monitor vital signs and CBGs as ordered, Perform/monitor CIWA, COWS, AIMS and Fall Risk screenings as ordered, Perform wound care treatments as ordered.  Evaluation of Outcomes: Not Progressing   LCSW Treatment Plan for Primary Diagnosis: MDD (major depressive disorder), recurrent severe, without psychosis (HCC) Long Term Goal(s): Safe transition to appropriate next level of care at discharge, Engage patient in therapeutic group addressing interpersonal concerns.  Short Term Goals: Engage patient in aftercare planning with referrals and resources, Increase social support, Increase ability to appropriately verbalize feelings, Increase emotional regulation, Facilitate acceptance of mental health diagnosis and concerns, Facilitate patient progression through stages of change regarding substance use diagnoses and concerns, Identify triggers associated with mental health/substance abuse issues, and Increase skills for wellness and recovery  Therapeutic Interventions: Assess for all discharge needs, 1 to 1 time with Social worker, Explore available resources and support systems, Assess for adequacy in community support network, Educate family and significant other(s) on suicide prevention, Complete Psychosocial Assessment, Interpersonal group therapy.  Evaluation of Outcomes: Not Progressing   Progress in Treatment: Attending groups: Yes. and No. Participating in groups: Yes. and No. Taking medication as prescribed: Yes. Toleration medication: Yes. Family/Significant other contact made: No, will contact:  CSW  will contact if given permission  Patient understands diagnosis: Yes. Discussing patient identified problems/goals with staff: Yes. Medical problems stabilized or resolved: Yes. Denies suicidal/homicidal ideation: Yes. Issues/concerns per patient self-inventory: No. Other: None   New problem(s) identified: No, Describe:  None identified  New Short Term/Long Term Goal(s): elimination of symptoms of psychosis, medication management for mood stabilization; elimination of SI thoughts; development of comprehensive mental wellness plan.    Patient Goals:  Just trying to get better, this past week or two things came to a head, I couldn't take it anymore. Get up and get back to normal  Discharge Plan or Barriers: CSW will assist with appropriate discharge planning   Reason for Continuation of Hospitalization: Depression Medication stabilization  Estimated Length of Stay: 1 to 7 days  Last 3 [place] Suicide Severity Risk Score: Flowsheet Row Admission (Current) from 01/17/2024 in [facility] ED from 01/16/2024 in [facility] Department at [facility] ED from 12/28/2023 in [facility] Emergency Department at [facility]  C-SSRS RISK CATEGORY Moderate Risk Low Risk No Risk    Last PHQ 2/9 Scores:    11/09/2023   11:34 AM 09/13/2023   11:24 AM 08/02/2023    9:55 AM  Depression screen PHQ 2/9  Decreased Interest 1 0 0  Down, Depressed, Hopeless 0 3 0  PHQ - 2 Score 1 3 0  Altered sleeping 1 3 0  Tired, decreased energy 2 3 0  Change in appetite 1 2 0  Feeling bad or failure about yourself  0 2 0  Trouble concentrating 0 2 0  Moving slowly or fidgety/restless 0 0 0  Suicidal thoughts 0 0 0  PHQ-9 Score 5 15 0    Scribe for Treatment Team: [name], [place] 01/19/2024 1:31 PM

## 2024-01-19 NOTE — Group Note (Signed)
 Physical/Occupational Therapy Group Note  Group Topic: Functional, Dynamic Balance   Group Date: 01/19/2024 Start Time: 1300 End Time: 1341 Facilitators: Xavia Kniskern, Alm Hamilton, PT   Group Description: Group discussed impact of balance on safety and independence with functional tasks.  Identified and discussed any self-perceived balance deficits to personalize information.  Discussed and reviewed strategies to address/improve balance deficits: use of assist devices, activity pacing/energy conservation, environment/home safety modifications, focusing attention/minimizing distraction.  Reviewed and participated with standing LE therex designed to target dynamic balance reactions and LE strength/stability; provided handouts with HEP to be utilized outside of group time as appropriate.  Allowed time for questions and further discussion on any balance or mobility concerns/needs.  Therapeutic Goal(s):  Identify and discuss any individual balance deficits and functional implications. Identify and discuss any environmental/home safety modifications that can optimize balance and safety for mobility within the home. Demonstrate understanding and performance of standing therex designed to target dynamic balance deficits.  Individual Participation: Pt actively participated with the discussion and physical activity components of the session.  Pt was generally steady with standing therex/balance activities requiring only SBA for general safety.  Participation Level: Active and Engaged   Participation Quality: Moderate Cues   Behavior: Appropriate   Speech/Thought Process: Coherent and Focused   Affect/Mood: Appropriate   Insight: Moderate   Judgement: Moderate   Modes of Intervention: Activity, Discussion, and Education  Patient Response to Interventions:  Attentive, Engaged, and Interested    Plan: Continue to engage patient in PT/OT groups 1 - 2x/week.  CHARM Hamilton Bertin PT, DPT 01/19/24, 3:29  PM

## 2024-01-19 NOTE — Progress Notes (Signed)
   01/18/24 2100  Psych Admission Type (Psych Patients Only)  Admission Status Voluntary  Psychosocial Assessment  Patient Complaints Anxiety;Depression  Eye Contact Brief  Facial Expression Animated  Affect Anxious  Speech Logical/coherent  Interaction Needy  Motor Activity Unsteady  Appearance/Hygiene In scrubs  Behavior Characteristics Cooperative  Mood Pleasant  Thought Process  Coherency WDL  Content WDL  Delusions None reported or observed  Perception WDL  Hallucination None reported or observed  Judgment WDL  Confusion None  Danger to Self  Current suicidal ideation? Denies  Danger to Others  Danger to Others None reported or observed   D: Patient is alert and oriented, described mood as good. Denies experiencing anxiety or depression at the time of assessment, as well as any pain, suicidal or homicidal ideation, or auditory or visual hallucinations. Patient visited with sons this shift.  A: Scheduled medications administered per provider orders. Trazodone  50 mg po prn offered for c/o insomnia. Support and encouragement provided, with frequent verbal contact. Routine safety checks conducted every 15 minutes.  R: No adverse drug reactions were observed. Offered prn effective at reassessment. Patient is agreeable to notifying staff of any safety concerns and remains compliant with medications. Interacted appropriately with others on the unit, remains safe at this time. Plan of care is ongoing.

## 2024-01-19 NOTE — Group Note (Signed)
 Date:  01/19/2024 Time:  8:54 PM  Group Topic/Focus:  Early Warning Signs:   The focus of this group is to help patients identify signs or symptoms they exhibit before slipping into an unhealthy state or crisis.  MHT Francis reviewed 15 minute rounds, explaining they would occur every 15 minutes. MHT set expectations for night rounding and informed patients not to be alarmed if they see someone looking in on them. MHT informed of the reason 15 minute rounds were conducted. MHT addressed concerns about hearing the clicking sound of doors throughout the night.  MHT Francis discussed the importance of communication with providers. MHT informed patients they needed to communicate with providers what their triggers were. MHT explained this would allow outpatient providers to help develop a crisis plan to help avoid the triggers.  MHT Francis encouraged patients to inform outpatient providers of the warning signs when they were not doing well. MHT explained this would help the provider identify their time of need and start the process of an appropriate intervention. MHT encouraged open communication with providers.  MHT Francis discussed dealing with stress. MHT encouraged patients to do things they enjoy doing to keep stress at a minimum. MHT informed group that stress can lead to burn out. MHT explained how stress can affect a person mentally and physically. MHT informed patients if they find they are not doing the things they enjoy, that could be a sign of burn out. MHT explained that by actually doing the things they enjoy, they could help reduce their stress. MHT provided examples of things to do to help keep stress at a minimum. Group listed exercise, reading, walking, playing games, playing with grandchildren, and going shopping.   MHT Francis opened group up for questions and concerns.     Participation Level:  Active  Participation Quality:  Attentive  Affect:  Appropriate  Cognitive:   Appropriate  Insight: Appropriate  Engagement in Group:  Engaged  Modes of Intervention:  Discussion  Additional Comments:  Participated throughout, made comment he enjoyed the group.  Francis JONETTA Boos 01/19/2024, 8:54 PM

## 2024-01-19 NOTE — Group Note (Signed)
 Date:  01/19/2024 Time:  12:00 PM  Group Topic/Focus:  Outside Rec/Music Therapy The purpose of this group is for patients to go out and get fresh air while participating in outside activities and socializing with other peers.    Participation Level:  Active  Participation Quality:  Appropriate  Affect:  Appropriate  Cognitive:  Appropriate  Insight: Appropriate  Engagement in Group:  Engaged  Modes of Intervention:  Activity  Additional Comments:    Beatris ONEIDA Hasten 01/19/2024, 12:00 PM

## 2024-01-19 NOTE — Progress Notes (Signed)
   01/19/24 2300  Psych Admission Type (Psych Patients Only)  Admission Status Voluntary  Psychosocial Assessment  Patient Complaints Depression  Eye Contact Fair  Facial Expression Animated  Affect Anxious  Speech Logical/coherent  Interaction Assertive  Motor Activity Unsteady  Appearance/Hygiene In scrubs  Behavior Characteristics Cooperative  Mood Pleasant  Thought Process  Coherency WDL  Content WDL  Delusions None reported or observed  Perception WDL  Hallucination None reported or observed  Judgment WDL  Confusion None  Danger to Self  Current suicidal ideation? Denies  Agreement Not to Harm Self Yes  Description of Agreement verbal  Danger to Others  Danger to Others None reported or observed

## 2024-01-19 NOTE — Progress Notes (Signed)
   01/19/24 1300  Psych Admission Type (Psych Patients Only)  Admission Status Voluntary  Psychosocial Assessment  Patient Complaints Depression  Eye Contact Fair  Facial Expression Animated  Affect Anxious  Speech Logical/coherent  Interaction Assertive  Motor Activity Unsteady  Appearance/Hygiene In scrubs  Behavior Characteristics Cooperative  Mood Pleasant  Thought Process  Coherency WDL  Content WDL  Delusions None reported or observed  Perception WDL  Hallucination None reported or observed  Judgment WDL  Confusion None  Danger to Self  Current suicidal ideation? Denies  Agreement Not to Harm Self Yes  Description of Agreement verbal

## 2024-01-19 NOTE — Plan of Care (Signed)
  Problem: Education: Goal: Mental status will improve Outcome: Progressing   Problem: Activity: Goal: Sleeping patterns will improve Outcome: Progressing   Problem: Physical Regulation: Goal: Ability to maintain clinical measurements within normal limits will improve Outcome: Progressing   Problem: Safety: Goal: Periods of time without injury will increase Outcome: Progressing   Problem: Coping: Goal: Coping ability will improve Outcome: Progressing

## 2024-01-19 NOTE — Progress Notes (Signed)
 [facility] MD Progress Note  01/19/2024 4:10 PM James [name]  MRN:  [id number]  Patient is a 68 year old male that presents this date voluntary to [facility] being brought in by his ex-wife after patient made statements to self-harm earlier this date. Patient denies any active plan or intent at the time of the assessment although stated earlier this date he had thoughts about overdosing on his medical medications or, do something else. Patient is admitted to [facility] unit with Q15 min safety monitoring. Multidisciplinary team approach is offered. Medication management; group/milieu therapy is offered.  Subjective:  Chart reviewed, case discussed in multidisciplinary meeting, patient seen during rounds.  Patient met with the treatment team today.  He continues to endorse feeling guilty about his choices and reports feeling depressed about his situation of not able to work things out with his girlfriend's and being confronted by his family including wife and sons.  He denies SI/HI/plan.  He is noted to be very unstable on his feet and reports that he has this chronic dizziness and orthostatic symptoms whenever he gets up.  He denies auditory/visual hallucinations.  He has fair appetite and sleep.  Provider called his wife and son Worth who informed the provider that patient has displayed acute changes in personality since last December where he started 36 this male whom he actually gave $48,000 from his wife's inheritance.  After she dumped him he got all of another girl in February whom he was texting and sending money to.  Wife reports that he even went to meet the girl in [place]  just to find that she is from [facility] .  A week ago the girl who is 66 year old told the patient that he will not be moving in with him but patient continues to send money to her.  Last week wife and the son reports that patient got into multiple Internet scams with the first 1 he sent thousands of dollars as he was promised  $8.1 million back.  The second scam also involved sending thousand plus dollars as he was told that he will be paid back $300,000 in cash.  Family expressed their concern about patient's ability to manage his finances and displaying this high risk impulsive behaviors which is very new to them.  Wife also describes patient displaying emotional lability, crying and making suicidal statements which she is very unlike of his personality that she has known all this years.  Provider discussed extensively about neurocognitive assessment during his next visit with his neurologist to look for any early onset dementia or cognitive impairment.   Sleep: Fair  Appetite:  Fair  Past Psychiatric History: see h&P Family History:  Family History  Problem Relation Age of Onset   Healthy Mother    Lung cancer Father        former smoker   Heart disease Brother        MI age 51   Other Brother        Murdered   Down syndrome Son    Diabetes Neg Hx    Prostate cancer Neg Hx    Colon cancer Neg Hx    Stomach cancer Neg Hx    Pancreatic cancer Neg Hx    Liver disease Neg Hx    Social History:  Social History   Substance and Sexual Activity  Alcohol Use No     Social History   Substance and Sexual Activity  Drug Use No    Social History   Socioeconomic  History   Marital status: Married    Spouse name: [name]   Number of children: 2   Years of education: Not on file   Highest education level: 12th grade  Occupational History   Occupation: disabled   Tobacco Use   Smoking status: Never   Smokeless tobacco: Never  Vaping Use   Vaping status: Never Used  Substance and Sexual Activity   Alcohol use: No   Drug use: No   Sexual activity: Yes    Partners: Female  Other Topics Concern   Not on file  Social History Narrative   Household-- pt , wife, one adult son with Down's syndrome   younger son lives in [place]   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   Social Drivers of Corporate investment banker Strain: Low Risk  (02/10/2023)   Overall Financial Resource Strain (CARDIA)    Difficulty of Paying Living Expenses: Not hard at all  Food Insecurity: No Food Insecurity (01/17/2024)   Hunger Vital Sign    Worried About Running Out of Food in the Last Year: Never true    Ran Out of Food in the Last Year: Never true  Transportation Needs: No Transportation Needs (01/17/2024)   PRAPARE - Administrator, Civil Service (Medical): No    Lack of Transportation (Non-Medical): No  Physical Activity: Unknown (02/10/2023)   Exercise Vital Sign    Days of Exercise per Week: 0 days    Minutes of Exercise per Session: Not on file  Stress: No Stress Concern Present (02/10/2023)   [employer] of Occupational Health - Occupational Stress Questionnaire    Feeling of Stress : Only a little  Social Connections: Moderately Integrated (01/17/2024)   Social Connection and Isolation Panel    Frequency of Communication with Friends and Family: More than three times a week    Frequency of Social Gatherings with Friends and Family: Twice a week    Attends Religious Services: 1 to 4 times per year    Active Member of Clubs or Organizations: Yes    Attends Banker Meetings: 1 to 4 times per year    Marital Status: Divorced   Past Medical History:  Past Medical History:  Diagnosis Date   Abnormal cardiac CT angiography    Acid reflux    Annual physical exam 04/08/2015   Arthritis    Atypical chest pain 06/13/2020   Blood transfusion without reported diagnosis    Body mass index (BMI) 35.0-35.9, adult 04/05/2019   Chronic fatigue 01/28/2015   Chronic headaches    on cymbalta    Chronic kidney disease, stage 3b (HCC) 07/14/2023   Chronic migraine w/o aura, not intractable, w/o stat migr 10/24/2018   Cirrhosis (HCC)    Colon polyps    Complication of anesthesia    problems waking up from anesthesia   Coronary artery  disease 01/23/2019   Depression    on cymbalta    Diabetes mellitus with neuropathy (HCC)    Diabetes with neuropathy 04/25/2013   Diverticulitis 03/2013   Dyslipidemia 05/29/2019   Eczema    Elevated LFTs    Epidural lipomatosis 10/05/2018   Essential (primary) hypertension 04/05/2019   Essential hypertension 10/10/2019   Fatty liver    GERD (gastroesophageal reflux disease) 04/28/2011   H/O craniotomy 05/07/2015   Headache 04/28/2011   Hepatitis 10/2017   NASH cirrhosis   History of kidney stones    Hyperlipidemia  Hypersomnia with sleep apnea 01/28/2015   Hypertension    IDA (iron  deficiency anemia) 01/24/2019   Idiopathic intracranial hypertension 01/14/2017   Insomnia 04/26/2013   Kidney stone    Liver cirrhosis secondary to NASH (nonalcoholic steatohepatitis) (HCC) 01/02/2016   Low back pain 04/05/2019   Lower back injury 08/14/2019   Morbid obesity (HCC)    Neuromuscular disorder (HCC)    neuropathy   Neuropathy    Nonalcoholic steatohepatitis 10/05/2018   Obstructive hydrocephalus (HCC) 01/28/2015   OSA -- dx ~ 2012, cpap intolerant 09/04/2014    dx ~ 2012, cpap intolerant    PCP NOTES >>> 04/08/2015   Post-op pain 03/19/2019   Post-traumatic hydrocephalus (HCC)    s/p shunts x 2 (first got infected )   Presence of cerebrospinal fluid drainage device 07/28/2011   Psoriasis    sees Dr Leanord   Psoriatic arthritis (HCC)    REM behavioral disorder 01/14/2017   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   see's Dr.Ortmann   Severe obesity (BMI >= 40) (HCC) 01/28/2015   SI (sacroiliac) joint dysfunction 08/14/2019   Sigmoid diverticulitis 04/25/2013   Sleep apnea    no CPAP      Spondylolisthesis, lumbar region 03/16/2019   Stomach ulcer    Testosterone  deficiency 04/28/2011   VP (ventriculoperitoneal) shunt status 07/31/2020    Past Surgical History:  Procedure Laterality Date   AMPUTATION Left 12/27/2021   Procedure: AMPUTATION GREAT TOE;   Surgeon: Gershon Donnice SAUNDERS, DPM;  Location: [facility] OR;  Service: Podiatry;  Laterality: Left;   BACK SURGERY  1980   BRAIN SURGERY     VP shunts placed in 2007   CHOLECYSTECTOMY N/A 08/25/2017   Procedure: LAPAROSCOPIC CHOLECYSTECTOMY WITH INTRAOPERATIVE CHOLANGIOGRAM;  Surgeon: Curvin Deward MOULD, MD;  Location: [facility] OR;  Service: General;  Laterality: N/A;   COLONOSCOPY     CORONARY STENT INTERVENTION N/A 09/27/2020   Procedure: CORONARY STENT INTERVENTION;  Surgeon: Dann Candyce RAMAN, MD;  Location: [facility] INVASIVE CV LAB;  Service: Cardiovascular;  Laterality: N/A;   CORONARY ULTRASOUND/IVUS N/A 09/27/2020   Procedure: Intravascular Ultrasound/IVUS;  Surgeon: Dann Candyce RAMAN, MD;  Location: [facility] INVASIVE CV LAB;  Service: Cardiovascular;  Laterality: N/A;   JOINT REPLACEMENT     total hip   LEFT HEART CATH N/A 09/27/2020   Procedure: Left Heart Cath;  Surgeon: Dann Candyce RAMAN, MD;  Location[facility] INVASIVE CV LAB;  Service: Cardiovascular;  Laterality: N/A;   LEFT HEART CATH AND CORONARY ANGIOGRAPHY N/A 09/24/2020   Procedure: LEFT HEART CATH AND CORONARY ANGIOGRAPHY;  Surgeon: Dann Candyce RAMAN, MD;  Location: [facility] INVASIVE CV LAB;  Service: Cardiovascular;  Laterality: N/A;   LEFT HEART CATH AND CORONARY ANGIOGRAPHY N/A 08/18/2021   Procedure: LEFT HEART CATH AND CORONARY ANGIOGRAPHY;  Surgeon: Burnard Debby LABOR, MD;  Location: [facility] INVASIVE CV LAB;  Service: Cardiovascular;  Laterality: N/A;   LUMBAR FUSION  03/16/2019   SHOULDER SURGERY Left 2010   TEE WITHOUT CARDIOVERSION N/A 01/01/2022   Procedure: TRANSESOPHAGEAL ECHOCARDIOGRAM (TEE);  Surgeon: Tobb, Kardie, DO;  Location: [facility] ENDOSCOPY;  Service: Cardiovascular;  Laterality: N/A;   TOE SURGERY Left 2018   TONSILLECTOMY     as a child   TOTAL HIP ARTHROPLASTY Left 2011   UPPER GASTROINTESTINAL ENDOSCOPY  01/04/2020   VENTRICULOPERITONEAL SHUNT  2007   x2    Current Medications: Current Facility-Administered Medications  Medication Dose  Route Frequency Provider Last Rate Last Admin   acetaminophen  (TYLENOL ) tablet 650 mg  650 mg Oral Q6H PRN Arloa Suzen RAMAN, NP   650 mg at 01/19/24 1124   alum & mag hydroxide-simeth (MAALOX/MYLANTA) 200-200-20 MG/5ML suspension 30 mL  30 mL Oral Q4H PRN Arloa Suzen RAMAN, NP       amLODipine  (NORVASC ) tablet 5 mg  5 mg Oral Daily Arloa Suzen RAMAN, NP   5 mg at 01/19/24 0935   aspirin  chewable tablet 81 mg  81 mg Oral Daily Arloa Suzen RAMAN, NP   81 mg at 01/19/24 9066   atorvastatin  (LIPITOR ) tablet 80 mg  80 mg Oral QHS Arloa Suzen RAMAN, NP   80 mg at 01/18/24 2138   carvedilol  (COREG ) tablet 12.5 mg  12.5 mg Oral BID WC Arloa Suzen RAMAN, NP   12.5 mg at 01/19/24 9066   clonazePAM  (KLONOPIN ) tablet 0.5 mg  0.5 mg Oral QHS Arloa Suzen RAMAN, NP   0.5 mg at 01/18/24 2138   dicyclomine  (BENTYL ) capsule 20 mg  20 mg Oral BID PRN Arloa Suzen RAMAN, NP       haloperidol  (HALDOL ) tablet 5 mg  5 mg Oral TID PRN Arloa Suzen RAMAN, NP       And   diphenhydrAMINE  (BENADRYL ) capsule 50 mg  50 mg Oral TID PRN Arloa Suzen RAMAN, NP       haloperidol  lactate (HALDOL ) injection 5 mg  5 mg Intramuscular TID PRN Arloa Suzen RAMAN, NP       And   diphenhydrAMINE  (BENADRYL ) injection 50 mg  50 mg Intramuscular TID PRN Arloa Suzen RAMAN, NP       And   LORazepam  (ATIVAN ) injection 2 mg  2 mg Intramuscular TID PRN Arloa Suzen RAMAN, NP       haloperidol  lactate (HALDOL ) injection 10 mg  10 mg Intramuscular TID PRN Arloa Suzen RAMAN, NP       And   diphenhydrAMINE  (BENADRYL ) injection 50 mg  50 mg Intramuscular TID PRN Arloa Suzen RAMAN, NP       And   LORazepam  (ATIVAN ) injection 2 mg  2 mg Intramuscular TID PRN Arloa Suzen RAMAN, NP       DULoxetine  (CYMBALTA ) DR capsule 120 mg  120 mg Oral Daily Arloa Suzen RAMAN, NP   120 mg at 01/19/24 9066   empagliflozin  (JARDIANCE ) tablet 25 mg  25 mg Oral Q breakfast Arloa Suzen RAMAN, NP   25 mg at 01/19/24 0935   feeding supplement (ENSURE PLUS  HIGH PROTEIN) liquid 237 mL  237 mL Oral BID BM Rainee Sweatt, MD   237 mL at 01/18/24 1219   gabapentin  (NEURONTIN ) capsule 600 mg  600 mg Oral BID Arloa Suzen RAMAN, NP   600 mg at 01/19/24 0935   hydrOXYzine  (ATARAX ) tablet 25 mg  25 mg Oral TID PRN Arloa Suzen RAMAN, NP   25 mg at 01/18/24 1417   insulin  aspart (novoLOG ) injection 0-15 Units  0-15 Units Subcutaneous TID WC Arloa Suzen RAMAN, NP   11 Units at 01/19/24 1126   isosorbide  mononitrate (IMDUR ) 24 hr tablet 120 mg  120 mg Oral Daily Arloa Suzen RAMAN, NP   120 mg at 01/19/24 9065   magnesium  hydroxide (MILK OF MAGNESIA) suspension 30 mL  30 mL Oral Daily PRN Arloa Suzen RAMAN, NP       melatonin tablet 5 mg  5 mg Oral QHS Arloa Suzen RAMAN, NP   5 mg at 01/18/24 2138   metFORMIN  (GLUCOPHAGE ) tablet 500 mg  500 mg Oral BID Arloa Suzen RAMAN, NP  500 mg at 01/19/24 9065   mupirocin  ointment (BACTROBAN ) 2 % 1 Application  1 Application Topical BID Mikah Rottinghaus, MD   1 Application at 01/19/24 0937   pantoprazole  (PROTONIX ) EC tablet 40 mg  40 mg Oral Daily Arloa Suzen RAMAN, NP   40 mg at 01/19/24 0935   traZODone  (DESYREL ) tablet 50 mg  50 mg Oral QHS PRN Arloa Suzen RAMAN, NP   50 mg at 01/18/24 2138    Lab Results:  Results for orders placed or performed during the hospital encounter of 01/17/24 (from the past 48 hours)  Glucose, capillary     Status: Abnormal   Collection Time: 01/17/24  5:14 PM  Result Value Ref Range   Glucose-Capillary 171 (H) 70 - 99 mg/dL    Comment: Glucose reference range applies only to samples taken after fasting for at least 8 hours.  Glucose, capillary     Status: Abnormal   Collection Time: 01/17/24  7:28 PM  Result Value Ref Range   Glucose-Capillary 131 (H) 70 - 99 mg/dL    Comment: Glucose reference range applies only to samples taken after fasting for at least 8 hours.  Glucose, capillary     Status: Abnormal   Collection Time: 01/18/24 11:31 AM  Result Value Ref Range    Glucose-Capillary 190 (H) 70 - 99 mg/dL    Comment: Glucose reference range applies only to samples taken after fasting for at least 8 hours.  Glucose, capillary     Status: Abnormal   Collection Time: 01/18/24  4:16 PM  Result Value Ref Range   Glucose-Capillary 181 (H) 70 - 99 mg/dL    Comment: Glucose reference range applies only to samples taken after fasting for at least 8 hours.  Glucose, capillary     Status: Abnormal   Collection Time: 01/18/24  9:25 PM  Result Value Ref Range   Glucose-Capillary 224 (H) 70 - 99 mg/dL    Comment: Glucose reference range applies only to samples taken after fasting for at least 8 hours.  Glucose, capillary     Status: Abnormal   Collection Time: 01/19/24  7:32 AM  Result Value Ref Range   Glucose-Capillary 141 (H) 70 - 99 mg/dL    Comment: Glucose reference range applies only to samples taken after fasting for at least 8 hours.  Glucose, capillary     Status: Abnormal   Collection Time: 01/19/24 11:24 AM  Result Value Ref Range   Glucose-Capillary 211 (H) 70 - 99 mg/dL    Comment: Glucose reference range applies only to samples taken after fasting for at least 8 hours.   *Note: Due to a large number of results and/or encounters for the requested time period, some results have not been displayed. A complete set of results can be found in Results Review.    Blood Alcohol level:  Lab Results  Component Value Date   [facility] <15 01/16/2024    Metabolic Disorder Labs: Lab Results  Component Value Date   HGBA1C 9.1 (A) 10/19/2023   MPG 139.85 08/15/2021   MPG 148.46 09/27/2020   Lab Results  Component Value Date   PROLACTIN 8.7 10/10/2014   Lab Results  Component Value Date   CHOL 132 01/20/2023   TRIG 145 08/20/2023   HDL 41.00 01/20/2023   CHOLHDL 3 01/20/2023   VLDL 48.0 (H) 01/20/2023   LDLCALC 68 01/30/2022   LDLCALC 55 08/16/2021    Physical Findings: AIMS:  , ,  ,  ,  CIWA:    COWS:      Psychiatric Specialty  Exam:  Presentation  General Appearance:  Appropriate for Environment; Casual  Eye Contact: Fair  Speech: Clear and Coherent  Speech Volume: Normal    Mood and Affect  Mood: Depressed; Dysphoric  Affect: Depressed; Flat   Thought Process  Thought Processes: Coherent  Descriptions of Associations:Intact  Orientation:Full (Time, Place and Person)  Thought Content:Logical  Hallucinations:Hallucinations: None  Ideas of Reference:None  Suicidal Thoughts:Suicidal Thoughts: No  Homicidal Thoughts:Homicidal Thoughts: No   Sensorium  Memory: Immediate Fair; Remote Fair; Recent Fair  Judgment: Impaired  Insight: None   Executive Functions  Concentration: Fair  Attention Span: Fair  Recall: [employer] of Knowledge: Fair  Language: Fair   Psychomotor Activity  Psychomotor Activity: Psychomotor Activity: Normal  Musculoskeletal: Strength & Muscle Tone: within normal limits Gait & Station: normal Assets  Assets: Manufacturing systems engineer; Desire for Improvement; Resilience; Social Support    Physical Exam: Physical Exam Vitals and nursing note reviewed.    ROS Blood pressure 110/74, pulse 68, temperature 98.3 F (36.8 C), resp. rate 18, height 5' 11 (1.803 m), weight 104.3 kg, SpO2 91%. Body mass index is 32.08 kg/m.  Diagnosis: Principal Problem:   MDD (major depressive disorder), recurrent severe, without psychosis (HCC)   Clinical Decision Making: Patient with multiple psychosocial stressors including multiple break-ups, separation from wife, trust issues with his girlfriend's, spending away the money, admitted inpatient after making suicidal ideation and statements.  Patient needs to be monitored closely for safety   Treatment Plan Summary:   Safety and Monitoring:             -- Voluntary admission to inpatient psychiatric unit for safety, stabilization and treatment             -- Daily contact with patient to assess and  evaluate symptoms and progress in treatment             -- Patient's case to be discussed in multi-disciplinary team meeting             -- Observation Level: q15 minute checks             -- Vital signs:  q12 hours             -- Precautions: suicide, elopement, and assault   2. Psychiatric Diagnoses and Treatment:               Continue Cymbalta .  Will add Depakote  for mood stabilization   -- The risks/benefits/side-effects/alternatives to this medication were discussed in detail with the patient and time was given for questions. The patient consents to medication trial.                -- Metabolic profile and EKG monitoring obtained while on an atypical antipsychotic (BMI: Lipid Panel: HbgA1c: QTc:)              -- Encouraged patient to participate in unit milieu and in scheduled group therapies                4. Discharge Planning:   -- Social work and case management to assist with discharge planning and identification of hospital follow-up needs prior to discharge  -- Estimated LOS: 3-4 days  Allyn Foil, MD 01/19/2024, 4:10 PM

## 2024-01-20 LAB — GLUCOSE, CAPILLARY
Glucose-Capillary: 157 mg/dL — ABNORMAL HIGH (ref 70–99)
Glucose-Capillary: 123 mg/dL — ABNORMAL HIGH (ref 70–99)
Glucose-Capillary: 166 mg/dL — ABNORMAL HIGH (ref 70–99)
Glucose-Capillary: 178 mg/dL — ABNORMAL HIGH (ref 70–99)

## 2024-01-20 MED ORDER — CLONAZEPAM 0.25 MG PO TBDP
0.2500 mg | ORAL_TABLET | Freq: Every day | ORAL | Status: DC
  Administered 2024-01-20 – 2024-01-24 (×5): 0.25 mg via ORAL
  Filled 2024-01-20 (×5): qty 1

## 2024-01-20 NOTE — Plan of Care (Signed)
   Problem: Education: Goal: Knowledge of [facility] Education information/materials will improve Outcome: Progressing Goal: Emotional status will improve Outcome: Progressing Goal: Mental status will improve Outcome: Progressing Goal: Verbalization of understanding the information provided will improve Outcome: Progressing

## 2024-01-20 NOTE — Progress Notes (Signed)
 [facility] MD Progress Note  01/20/2024 1:08 PM James [name]  MRN:  [id number]  Patient is a 68 year old male that presents this date voluntary to [facility] being brought in by his ex-wife after patient made statements to self-harm earlier this date. Patient denies any active plan or intent at the time of the assessment although stated earlier this date he had thoughts about overdosing on his medical medications or, do something else. Patient is admitted to [facility] unit with Q15 min safety monitoring. Multidisciplinary team approach is offered. Medication management; group/milieu therapy is offered.  Subjective:  Chart reviewed, case discussed in multidisciplinary meeting, patient seen during rounds.  Today on interview patient is noted to be resting in bed.  He remains discharge focused and wanted to go home.  He denies feeling depressed or anxious.  Provider discussed about the concerns of his family about his finances and the way he has disclosed his personal information bank accounts to strangers.  Provider discussed the recommendation of taking neurocognitive assessment after discharge and follow-up with neurology.  He denies SI/HI/plan and denies hallucinations.  He is participating in groups intermittently.  Patient has chronic orthostatic hypotension and gets dizzy when he gets up suddenly.  He was reporting headache today and inform the provider that he missed the Botox  injections that he was supposed to get 2 weeks ago.    Collaterals:from 01/19/24 Provider called his wife and son Worth who informed the provider that patient has displayed acute changes in personality since last December where he started 25 this male whom he actually gave $48,000 from his wife's inheritance.  After she dumped him he got all of another girl in February whom he was texting and sending money to.  Wife reports that he even went to meet the girl in [place]  just to find that she is from [facility] [place] .  A week ago the  girl who is 68 year old told the patient that he will not be moving in with him but patient continues to send money to her.  Last week wife and the son reports that patient got into multiple Internet scams with the first 1 he sent thousands of dollars as he was promised $8.1 million back.  The second scam also involved sending thousand plus dollars as he was told that he will be paid back $300,000 in cash.  Family expressed their concern about patient's ability to manage his finances and displaying this high risk impulsive behaviors which is very new to them.  Wife also describes patient displaying emotional lability, crying and making suicidal statements which she is very unlike of his personality that she has known all this years.  Provider discussed extensively about neurocognitive assessment during his next visit with his neurologist to look for any early onset dementia or cognitive impairment.   Sleep: Fair  Appetite:  Fair  Past Psychiatric History: see h&P Family History:  Family History  Problem Relation Age of Onset   Healthy Mother    Lung cancer Father        former smoker   Heart disease Brother        MI age 36   Other Brother        Murdered   Down syndrome Son    Diabetes Neg Hx    Prostate cancer Neg Hx    Colon cancer Neg Hx    Stomach cancer Neg Hx    Pancreatic cancer Neg Hx    Liver disease Neg Hx  Social History:  Social History   Substance and Sexual Activity  Alcohol Use No     Social History   Substance and Sexual Activity  Drug Use No    Social History   Socioeconomic History   Marital status: Married    Spouse name: [name]   Number of children: 2   Years of education: Not on file   Highest education level: 12th grade  Occupational History   Occupation: disabled   Tobacco Use   Smoking status: Never   Smokeless tobacco: Never  Vaping Use   Vaping status: Never Used  Substance and Sexual Activity   Alcohol use: No   Drug use: No   Sexual  activity: Yes    Partners: Female  Other Topics Concern   Not on file  Social History Narrative   Household-- pt , wife, one adult son with Down's syndrome   younger son lives in [place]   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   Social Drivers of Corporate investment banker Strain: Low Risk  (02/10/2023)   Overall Financial Resource Strain (CARDIA)    Difficulty of Paying Living Expenses: Not hard at all  Food Insecurity: No Food Insecurity (01/17/2024)   Hunger Vital Sign    Worried About Running Out of Food in the Last Year: Never true    Ran Out of Food in the Last Year: Never true  Transportation Needs: No Transportation Needs (01/17/2024)   PRAPARE - Administrator, Civil Service (Medical): No    Lack of Transportation (Non-Medical): No  Physical Activity: Unknown (02/10/2023)   Exercise Vital Sign    Days of Exercise per Week: 0 days    Minutes of Exercise per Session: Not on file  Stress: No Stress Concern Present (02/10/2023)   [employer] of Occupational Health - Occupational Stress Questionnaire    Feeling of Stress : Only a little  Social Connections: Moderately Integrated (01/17/2024)   Social Connection and Isolation Panel    Frequency of Communication with Friends and Family: More than three times a week    Frequency of Social Gatherings with Friends and Family: Twice a week    Attends Religious Services: 1 to 4 times per year    Active Member of Clubs or Organizations: Yes    Attends Banker Meetings: 1 to 4 times per year    Marital Status: Divorced   Past Medical History:  Past Medical History:  Diagnosis Date   Abnormal cardiac CT angiography    Acid reflux    Annual physical exam 04/08/2015   Arthritis    Atypical chest pain 06/13/2020   Blood transfusion without reported diagnosis    Body mass index (BMI) 35.0-35.9, adult 04/05/2019   Chronic fatigue 01/28/2015   Chronic headaches    on  cymbalta    Chronic kidney disease, stage 3b (HCC) 07/14/2023   Chronic migraine w/o aura, not intractable, w/o stat migr 10/24/2018   Cirrhosis (HCC)    Colon polyps    Complication of anesthesia    problems waking up from anesthesia   Coronary artery disease 01/23/2019   Depression    on cymbalta    Diabetes mellitus with neuropathy (HCC)    Diabetes with neuropathy 04/25/2013   Diverticulitis 03/2013   Dyslipidemia 05/29/2019   Eczema    Elevated LFTs    Epidural lipomatosis 10/05/2018   Essential (primary) hypertension 04/05/2019   Essential hypertension 10/10/2019   Fatty  liver    GERD (gastroesophageal reflux disease) 04/28/2011   H/O craniotomy 05/07/2015   Headache 04/28/2011   Hepatitis 10/2017   NASH cirrhosis   History of kidney stones    Hyperlipidemia    Hypersomnia with sleep apnea 01/28/2015   Hypertension    IDA (iron  deficiency anemia) 01/24/2019   Idiopathic intracranial hypertension 01/14/2017   Insomnia 04/26/2013   Kidney stone    Liver cirrhosis secondary to NASH (nonalcoholic steatohepatitis) (HCC) 01/02/2016   Low back pain 04/05/2019   Lower back injury 08/14/2019   Morbid obesity (HCC)    Neuromuscular disorder (HCC)    neuropathy   Neuropathy    Nonalcoholic steatohepatitis 10/05/2018   Obstructive hydrocephalus (HCC) 01/28/2015   OSA -- dx ~ 2012, cpap intolerant 09/04/2014    dx ~ 2012, cpap intolerant    PCP NOTES >>> 04/08/2015   Post-op pain 03/19/2019   Post-traumatic hydrocephalus (HCC)    s/p shunts x 2 (first got infected )   Presence of cerebrospinal fluid drainage device 07/28/2011   Psoriasis    sees Dr Leanord   Psoriatic arthritis (HCC)    REM behavioral disorder 01/14/2017   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   see's Dr.Ortmann   Severe obesity (BMI >= 40) (HCC) 01/28/2015   SI (sacroiliac) joint dysfunction 08/14/2019   Sigmoid diverticulitis 04/25/2013   Sleep apnea    no CPAP       Spondylolisthesis, lumbar region 03/16/2019   Stomach ulcer    Testosterone  deficiency 04/28/2011   VP (ventriculoperitoneal) shunt status 07/31/2020    Past Surgical History:  Procedure Laterality Date   AMPUTATION Left 12/27/2021   Procedure: AMPUTATION GREAT TOE;  Surgeon: Gershon Donnice SAUNDERS, DPM;  Location: [facility] OR;  Service: Podiatry;  Laterality: Left;   BACK SURGERY  1980   BRAIN SURGERY     VP shunts placed in 2007   CHOLECYSTECTOMY N/A 08/25/2017   Procedure: LAPAROSCOPIC CHOLECYSTECTOMY WITH INTRAOPERATIVE CHOLANGIOGRAM;  Surgeon: Curvin Deward MOULD, MD;  Location: [facility] OR;  Service: General;  Laterality: N/A;   COLONOSCOPY     CORONARY STENT INTERVENTION N/A 09/27/2020   Procedure: CORONARY STENT INTERVENTION;  Surgeon: Dann Candyce RAMAN, MD;  Location: [facility] INVASIVE CV LAB;  Service: Cardiovascular;  Laterality: N/A;   CORONARY ULTRASOUND/IVUS N/A 09/27/2020   Procedure: Intravascular Ultrasound/IVUS;  Surgeon: Dann Candyce RAMAN, MD;  Location: [facility] INVASIVE CV LAB;  Service: Cardiovascular;  Laterality: N/A;   JOINT REPLACEMENT     total hip   LEFT HEART CATH N/A 09/27/2020   Procedure: Left Heart Cath;  Surgeon: Dann Candyce RAMAN, MD;  Location: [facility] INVASIVE CV LAB;  Service: Cardiovascular;  Laterality: N/A;   LEFT HEART CATH AND CORONARY ANGIOGRAPHY N/A 09/24/2020   Procedure: LEFT HEART CATH AND CORONARY ANGIOGRAPHY;  Surgeon: Dann Candyce RAMAN, MD;  Location: [facility] INVASIVE CV LAB;  Service: Cardiovascular;  Laterality: N/A;   LEFT HEART CATH AND CORONARY ANGIOGRAPHY N/A 08/18/2021   Procedure: LEFT HEART CATH AND CORONARY ANGIOGRAPHY;  Surgeon: Burnard Debby LABOR, MD;  Location: [facility] INVASIVE CV LAB;  Service: Cardiovascular;  Laterality: N/A;   LUMBAR FUSION  03/16/2019   SHOULDER SURGERY Left 2010   TEE WITHOUT CARDIOVERSION N/A 01/01/2022   Procedure: TRANSESOPHAGEAL ECHOCARDIOGRAM (TEE);  Surgeon: Tobb, Kardie, DO;  Location: [facility] ENDOSCOPY;  Service: Cardiovascular;  Laterality: N/A;    TOE SURGERY Left 2018   TONSILLECTOMY     as a child   TOTAL HIP ARTHROPLASTY Left 2011  UPPER GASTROINTESTINAL ENDOSCOPY  01/04/2020   VENTRICULOPERITONEAL SHUNT  2007   x2    Current Medications: Current Facility-Administered Medications  Medication Dose Route Frequency Provider Last Rate Last Admin   acetaminophen  (TYLENOL ) tablet 650 mg  650 mg Oral Q6H PRN Arloa Suzen RAMAN, NP   650 mg at 01/20/24 0931   alum & mag hydroxide-simeth (MAALOX/MYLANTA) 200-200-20 MG/5ML suspension 30 mL  30 mL Oral Q4H PRN Arloa Suzen RAMAN, NP       aspirin  chewable tablet 81 mg  81 mg Oral Daily Arloa Suzen RAMAN, NP   81 mg at 01/20/24 0932   atorvastatin  (LIPITOR ) tablet 80 mg  80 mg Oral QHS Arloa Suzen RAMAN, NP   80 mg at 01/19/24 2121   carvedilol  (COREG ) tablet 12.5 mg  12.5 mg Oral BID WC Arloa Suzen RAMAN, NP   12.5 mg at 01/19/24 1659   clonazePAM  (KLONOPIN ) disintegrating tablet 0.25 mg  0.25 mg Oral QHS Ayn Domangue, MD       dicyclomine  (BENTYL ) capsule 20 mg  20 mg Oral BID PRN Arloa Suzen RAMAN, NP       haloperidol  (HALDOL ) tablet 5 mg  5 mg Oral TID PRN Arloa Suzen RAMAN, NP       And   diphenhydrAMINE  (BENADRYL ) capsule 50 mg  50 mg Oral TID PRN Arloa Suzen RAMAN, NP       haloperidol  lactate (HALDOL ) injection 5 mg  5 mg Intramuscular TID PRN Arloa Suzen RAMAN, NP       And   diphenhydrAMINE  (BENADRYL ) injection 50 mg  50 mg Intramuscular TID PRN Arloa Suzen RAMAN, NP       And   LORazepam  (ATIVAN ) injection 2 mg  2 mg Intramuscular TID PRN Arloa Suzen RAMAN, NP       haloperidol  lactate (HALDOL ) injection 10 mg  10 mg Intramuscular TID PRN Arloa Suzen RAMAN, NP       And   diphenhydrAMINE  (BENADRYL ) injection 50 mg  50 mg Intramuscular TID PRN Arloa Suzen RAMAN, NP       And   LORazepam  (ATIVAN ) injection 2 mg  2 mg Intramuscular TID PRN Arloa Suzen RAMAN, NP       divalproex  (DEPAKOTE  ER) 24 hr tablet 500 mg  500 mg Oral QHS Cher Franzoni, MD   500 mg at  01/19/24 2118   DULoxetine  (CYMBALTA ) DR capsule 120 mg  120 mg Oral Daily Arloa Suzen RAMAN, NP   120 mg at 01/20/24 0932   empagliflozin  (JARDIANCE ) tablet 25 mg  25 mg Oral Q breakfast Arloa Suzen RAMAN, NP   25 mg at 01/20/24 0932   feeding supplement (ENSURE PLUS HIGH PROTEIN) liquid 237 mL  237 mL Oral BID BM Jordin Dambrosio, MD   237 mL at 01/18/24 1219   gabapentin  (NEURONTIN ) capsule 600 mg  600 mg Oral BID Arloa Suzen RAMAN, NP   600 mg at 01/20/24 9066   hydrOXYzine  (ATARAX ) tablet 25 mg  25 mg Oral TID PRN Arloa Suzen RAMAN, NP   25 mg at 01/18/24 1417   insulin  aspart (novoLOG ) injection 0-15 Units  0-15 Units Subcutaneous TID WC Arloa Suzen RAMAN, NP   3 Units at 01/20/24 9191   isosorbide  mononitrate (IMDUR ) 24 hr tablet 120 mg  120 mg Oral Daily Arloa Suzen RAMAN, NP   120 mg at 01/20/24 9065   magnesium  hydroxide (MILK OF MAGNESIA) suspension 30 mL  30 mL Oral Daily PRN Arloa Suzen RAMAN, NP  melatonin tablet 5 mg  5 mg Oral QHS Arloa Suzen RAMAN, NP   5 mg at 01/19/24 2118   metFORMIN  (GLUCOPHAGE ) tablet 500 mg  500 mg Oral BID Arloa Suzen RAMAN, NP   500 mg at 01/20/24 0933   mupirocin  ointment (BACTROBAN ) 2 % 1 Application  1 Application Topical BID Janyla Biscoe, MD   1 Application at 01/20/24 0934   pantoprazole  (PROTONIX ) EC tablet 40 mg  40 mg Oral Daily Arloa Suzen RAMAN, NP   40 mg at 01/20/24 0932   traZODone  (DESYREL ) tablet 50 mg  50 mg Oral QHS PRN Arloa Suzen RAMAN, NP   50 mg at 01/18/24 2138    Lab Results:  Results for orders placed or performed during the hospital encounter of 01/17/24 (from the past 48 hours)  Glucose, capillary     Status: Abnormal   Collection Time: 01/18/24  4:16 PM  Result Value Ref Range   Glucose-Capillary 181 (H) 70 - 99 mg/dL    Comment: Glucose reference range applies only to samples taken after fasting for at least 8 hours.  Glucose, capillary     Status: Abnormal   Collection Time: 01/18/24  9:25 PM  Result  Value Ref Range   Glucose-Capillary 224 (H) 70 - 99 mg/dL    Comment: Glucose reference range applies only to samples taken after fasting for at least 8 hours.  Glucose, capillary     Status: Abnormal   Collection Time: 01/19/24  7:32 AM  Result Value Ref Range   Glucose-Capillary 141 (H) 70 - 99 mg/dL    Comment: Glucose reference range applies only to samples taken after fasting for at least 8 hours.  Glucose, capillary     Status: Abnormal   Collection Time: 01/19/24 11:24 AM  Result Value Ref Range   Glucose-Capillary 211 (H) 70 - 99 mg/dL    Comment: Glucose reference range applies only to samples taken after fasting for at least 8 hours.  Glucose, capillary     Status: Abnormal   Collection Time: 01/19/24  4:35 PM  Result Value Ref Range   Glucose-Capillary 117 (H) 70 - 99 mg/dL    Comment: Glucose reference range applies only to samples taken after fasting for at least 8 hours.  Glucose, capillary     Status: Abnormal   Collection Time: 01/19/24  8:41 PM  Result Value Ref Range   Glucose-Capillary 152 (H) 70 - 99 mg/dL    Comment: Glucose reference range applies only to samples taken after fasting for at least 8 hours.  Glucose, capillary     Status: Abnormal   Collection Time: 01/20/24  7:35 AM  Result Value Ref Range   Glucose-Capillary 157 (H) 70 - 99 mg/dL    Comment: Glucose reference range applies only to samples taken after fasting for at least 8 hours.  Glucose, capillary     Status: Abnormal   Collection Time: 01/20/24 11:37 AM  Result Value Ref Range   Glucose-Capillary 123 (H) 70 - 99 mg/dL    Comment: Glucose reference range applies only to samples taken after fasting for at least 8 hours.   *Note: Due to a large number of results and/or encounters for the requested time period, some results have not been displayed. A complete set of results can be found in Results Review.    Blood Alcohol level:  Lab Results  Component Value Date   [facility] <15 01/16/2024     Metabolic Disorder Labs: Lab Results  Component  Value Date   HGBA1C 9.1 (A) 10/19/2023   MPG 139.85 08/15/2021   MPG 148.46 09/27/2020   Lab Results  Component Value Date   PROLACTIN 8.7 10/10/2014   Lab Results  Component Value Date   CHOL 132 01/20/2023   TRIG 145 08/20/2023   HDL 41.00 01/20/2023   CHOLHDL 3 01/20/2023   VLDL 48.0 (H) 01/20/2023   LDLCALC 68 01/30/2022   LDLCALC 55 08/16/2021    Physical Findings: AIMS:  , ,  ,  ,    CIWA:    COWS:      Psychiatric Specialty Exam:  Presentation  General Appearance:  Appropriate for Environment; Casual  Eye Contact: Fair  Speech: Clear and Coherent  Speech Volume: Normal    Mood and Affect  Mood: Depressed; Dysphoric  Affect: Depressed; Flat   Thought Process  Thought Processes: Coherent  Descriptions of Associations:Intact  Orientation:Full (Time, Place and Person)  Thought Content:Logical  Hallucinations:Hallucinations: None   Ideas of Reference:None  Suicidal Thoughts:Suicidal Thoughts: No   Homicidal Thoughts:Homicidal Thoughts: No    Sensorium  Memory: Immediate Fair; Remote Fair; Recent Fair  Judgment: Impaired  Insight: None   Executive Functions  Concentration: Fair  Attention Span: Fair  Recall: [employer] of Knowledge: Fair  Language: Fair   Psychomotor Activity  Psychomotor Activity: No data recorded  Musculoskeletal: Strength & Muscle Tone: within normal limits Gait & Station: normal Assets  Assets: Manufacturing systems engineer; Desire for Improvement; Resilience; Social Support    Physical Exam: Physical Exam Vitals and nursing note reviewed.    ROS Blood pressure 117/81, pulse 73, temperature (!) 97.2 F (36.2 C), resp. rate 16, height 5' 11 (1.803 m), weight 104.3 kg, SpO2 (!) 88%. Body mass index is 32.08 kg/m.  Diagnosis: Principal Problem:   MDD (major depressive disorder), recurrent severe, without psychosis  (HCC)   Clinical Decision Making: Patient with multiple psychosocial stressors including multiple break-ups, separation from wife, trust issues with his girlfriend's, spending away the money, admitted inpatient after making suicidal ideation and statements.  Patient needs to be monitored closely for safety   Treatment Plan Summary:   Safety and Monitoring:             -- Voluntary admission to inpatient psychiatric unit for safety, stabilization and treatment             -- Daily contact with patient to assess and evaluate symptoms and progress in treatment             -- Patient's case to be discussed in multi-disciplinary team meeting             -- Observation Level: q15 minute checks             -- Vital signs:  q12 hours             -- Precautions: suicide, elopement, and assault   2. Psychiatric Diagnoses and Treatment:               Continue Cymbalta .  Will add Depakote  for mood stabilization.Klonopin  reduced to 0.25mg  at bedtime due to low BP.   -- The risks/benefits/side-effects/alternatives to this medication were discussed in detail with the patient and time was given for questions. The patient consents to medication trial.                -- Metabolic profile and EKG monitoring obtained while on an atypical antipsychotic (BMI: Lipid Panel: HbgA1c: QTc:)              --  Encouraged patient to participate in unit milieu and in scheduled group therapies                4. Discharge Planning:   -- Social work and case management to assist with discharge planning and identification of hospital follow-up needs prior to discharge  -- Estimated LOS: 3-4 days  Allyn Foil, MD 01/20/2024, 1:08 PM

## 2024-01-20 NOTE — Plan of Care (Signed)
  Problem: Education: Goal: Knowledge of [facility] Education information/materials will improve Outcome: Progressing Goal: Emotional status will improve Outcome: Progressing Goal: Mental status will improve Outcome: Progressing Goal: Verbalization of understanding the information provided will improve Outcome: Progressing   Problem: Activity: Goal: Interest or engagement in activities will improve Outcome: Progressing Goal: Sleeping patterns will improve Outcome: Progressing   Problem: Coping: Goal: Ability to verbalize frustrations and anger appropriately will improve Outcome: Progressing Goal: Ability to demonstrate self-control will improve Outcome: Progressing   Problem: Health Behavior/Discharge Planning: Goal: Identification of resources available to assist in meeting health care needs will improve Outcome: Progressing Goal: Compliance with treatment plan for underlying cause of condition will improve Outcome: Progressing   Problem: Physical Regulation: Goal: Ability to maintain clinical measurements within normal limits will improve Outcome: Progressing   Problem: Safety: Goal: Periods of time without injury will increase Outcome: Progressing   Problem: Coping: Goal: Coping ability will improve Outcome: Progressing

## 2024-01-20 NOTE — Group Note (Signed)
 Recreation Therapy Group Note   Group Topic:Stress Management  Group Date: 01/20/2024 Start Time: 1500 End Time: 1600 Facilitators: Celestia Jeoffrey BRAVO, LRT, CTRS Location: Dayroom  Group Description: Stress Charades. Patients and LRT discuss the emotion "stress" and things that are associated with it. LRT encourages pts to make their own list of things that stress them out on a piece of paper provided. LRT encourages pts to identify their top 5 stressors from original list and has pts write them on smaller cut slips of paper. LRT folds and places slips into a container. Pt pulls a random slip out of the container and acts it out. LRT and pts try to guess what the peer is acting out. After all slips of paper are acted out, LRT and pts will discuss different coping skills that help them when they're feeling stressed.   Goal Area(s) Addressed: Patient will identify things that make them feel stressed.  Patient will visualize stressors by acting them out.  Patient will recognize that others experience stress. Patient will become more self-aware of stressors. Patient will discuss and learn alternative coping strategies for handling stress.   Affect/Mood: Appropriate   Participation Level: Active and Engaged   Participation Quality: Independent   Behavior: Appropriate, Calm, and Cooperative   Speech/Thought Process: Coherent   Insight: Good   Judgement: Good   Modes of Intervention: Cooperative Play, Exploration, Role-play, and Socialization   Patient Response to Interventions:  Attentive, Engaged, Interested , and Receptive   Education Outcome:  Acknowledges education   Clinical Observations/Individualized Feedback: Abby was active in their participation of session activities and group discussion. Pt completed prompts appropriately. Pt interacted well with LRT and peers duration of session.    Plan: Continue to engage patient in RT group sessions 2-3x/week.   [address]  LRT, CTRS 01/20/2024 4:11 PM
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
## 2024-01-20 NOTE — Group Note (Signed)
 Date:  01/20/2024 Time:  11:11 AM  Group Topic/Focus:  Goals Group/Karaoke:   The focus of this group is to help patients establish daily goals to achieve during treatment and discuss how the patient can incorporate goal setting into their daily lives to aide in recovery. Also singing along to some of their favorite songs while interacting with peers.    Participation Level:  Active  Participation Quality:  Appropriate  Affect:  Appropriate  Cognitive:  Appropriate  Insight: Appropriate  Engagement in Group:  Engaged  Modes of Intervention:  Activity and Discussion  Additional Comments:    James [name] 01/20/2024, 11:11 AM

## 2024-01-20 NOTE — Progress Notes (Signed)
   01/20/24 1700  Psych Admission Type (Psych Patients Only)  Admission Status Voluntary  Psychosocial Assessment  Patient Complaints Depression  Eye Contact Brief  Facial Expression Animated  Affect Anxious  Speech Logical/coherent  Interaction Assertive  Motor Activity Slow  Appearance/Hygiene Unremarkable  Behavior Characteristics Cooperative  Mood Pleasant  Thought Process  Coherency WDL  Content WDL  Delusions None reported or observed  Perception WDL  Hallucination None reported or observed  Judgment WDL  Confusion None  Danger to Self  Current suicidal ideation? Denies  Agreement Not to Harm Self Yes  Description of Agreement verbal  Danger to Others  Danger to Others None reported or observed

## 2024-01-20 NOTE — [facility] Suicide Risk Assessment (Signed)
 [facility] INPATIENT:  Family/Significant Other Suicide Prevention Education  Suicide Prevention Education:  Contact Attempts: James [name], wife , [phone] of family member/significant other) has been identified by the patient as the family member/significant other with whom the patient will be residing, and identified as the person(s) who will aid the patient in the event of a mental health crisis.  With written consent from the patient, two attempts were made to provide suicide prevention education, prior to and/or following the patient's discharge.  We were unsuccessful in providing suicide prevention education.  A suicide education pamphlet was given to the patient to share with family/significant other.  Date and time of first attempt:01/20/2024   [name] 01/20/2024, 3:51 PM

## 2024-01-20 NOTE — Group Note (Signed)
 Recreation Therapy Group Note   Group Topic:General Recreation  Group Date: 01/20/2024 Start Time: 1100 End Time: 1135 Facilitators: Celestia Jeoffrey BRAVO, LRT, CTRS Location: Courtyard  Group Description: Outdoor Recreation. Patients had the option to play corn hole, ring toss, bowling or listening to music while outside in the courtyard getting fresh air and sunlight. Patients helped water and prune the raised garden beds. LRT and patients discussed things that they enjoy doing in their free time outside of the hospital. LRT encouraged patients to drink water after being active and getting their heart rate up.   Goal Area(s) Addressed: Patient will identify leisure interests.  Patient will practice healthy decision making. Patient will engage in recreation activity.   Affect/Mood: Appropriate   Participation Level: Active and Engaged   Participation Quality: Independent   Behavior: Appropriate, Calm, and Cooperative   Speech/Thought Process: Coherent   Insight: Good   Judgement: Good   Modes of Intervention: Activity   Patient Response to Interventions:  Receptive   Education Outcome:  Acknowledges education   Clinical Observations/Individualized Feedback: Abby was active in their participation of session activities and group discussion. Pt interacted well with LRT and peers duration of session.    Plan: Continue to engage patient in RT group sessions 2-3x/week.   Jeoffrey BRAVO Celestia, LRT, CTRS 01/20/2024 1:53 PM

## 2024-01-20 NOTE — Progress Notes (Signed)
   01/20/24 2100  Psych Admission Type (Psych Patients Only)  Admission Status Voluntary  Psychosocial Assessment  Patient Complaints Depression  Eye Contact Brief  Facial Expression Animated  Affect Anxious  Speech Logical/coherent  Interaction Assertive  Motor Activity Slow  Appearance/Hygiene Unremarkable  Behavior Characteristics Cooperative  Mood Pleasant  Thought Process  Coherency WDL  Content WDL  Delusions None reported or observed  Perception WDL  Hallucination None reported or observed  Judgment WDL  Confusion None  Danger to Self  Current suicidal ideation? Denies  Agreement Not to Harm Self Yes  Description of Agreement verbal  Danger to Others  Danger to Others None reported or observed

## 2024-01-20 NOTE — Group Note (Signed)
 Date:  01/20/2024 Time:  10:07 PM  Group Topic/Focus:  Self Care:   The focus of this group is to help patients understand the importance of self-care in order to improve or restore emotional, physical, spiritual, interpersonal, and financial health.    Participation Level:  Active  Participation Quality:  Appropriate  Affect:  Appropriate  Cognitive:  Appropriate  Insight: Appropriate  Engagement in Group:  Engaged  Modes of Intervention:  Education  Additional Comments:    James [name] 01/20/2024, 10:07 PM
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
## 2024-01-21 DIAGNOSIS — R4189 Other symptoms and signs involving cognitive functions and awareness: Secondary | ICD-10-CM

## 2024-01-21 LAB — GLUCOSE, CAPILLARY
Glucose-Capillary: 89 mg/dL (ref 70–99)
Glucose-Capillary: 170 mg/dL — ABNORMAL HIGH (ref 70–99)
Glucose-Capillary: 128 mg/dL — ABNORMAL HIGH (ref 70–99)
Glucose-Capillary: 194 mg/dL — ABNORMAL HIGH (ref 70–99)

## 2024-01-21 NOTE — Plan of Care (Signed)
  Problem: Education: Goal: Knowledge of [facility] Education information/materials will improve Outcome: Progressing Goal: Emotional status will improve Outcome: Progressing Goal: Mental status will improve Outcome: Progressing Goal: Verbalization of understanding the information provided will improve Outcome: Progressing   Problem: Activity: Goal: Interest or engagement in activities will improve Outcome: Progressing Goal: Sleeping patterns will improve Outcome: Progressing   Problem: Coping: Goal: Ability to verbalize frustrations and anger appropriately will improve Outcome: Progressing Goal: Ability to demonstrate self-control will improve Outcome: Progressing   Problem: Health Behavior/Discharge Planning: Goal: Identification of resources available to assist in meeting health care needs will improve Outcome: Progressing Goal: Compliance with treatment plan for underlying cause of condition will improve Outcome: Progressing   Problem: Physical Regulation: Goal: Ability to maintain clinical measurements within normal limits will improve Outcome: Progressing   Problem: Safety: Goal: Periods of time without injury will increase Outcome: Progressing   Problem: Coping: Goal: Coping ability will improve Outcome: Progressing

## 2024-01-21 NOTE — Progress Notes (Signed)
   01/21/24 1100  Psych Admission Type (Psych Patients Only)  Admission Status Voluntary  Psychosocial Assessment  Patient Complaints Depression  Eye Contact Brief  Facial Expression Animated  Affect Anxious  Speech Logical/coherent  Interaction Assertive  Motor Activity Slow  Appearance/Hygiene Unremarkable  Behavior Characteristics Cooperative  Mood Pleasant  Thought Process  Coherency WDL  Content WDL  Delusions None reported or observed  Perception WDL  Hallucination None reported or observed  Judgment WDL  Confusion None  Danger to Self  Current suicidal ideation? Denies  Agreement Not to Harm Self Yes  Description of Agreement verbal  Danger to Others  Danger to Others None reported or observed

## 2024-01-21 NOTE — Group Note (Signed)
 Physical/Occupational Therapy Group Note  Group Topic: Estate manager/land agent, Adaptive Equipment for ADLs  Group Date: 01/21/2024 Start Time: 1300 End Time: 1330 Facilitators: Lavonda Therisa CROME, OT   Group Description: Group educated on safety considerations/modifications with bathing, dressing and ADL routines to maximize independence and minimize fall risk with tasks.  Reviewed and demonstrated use of shower chair and tub transfer bench as appropriate.  Reviewed and demonstrated use of adaptive equipment (sock aide, reacher, long-handled sponge) available for ADL tasks.  Reviewed and demonstrated role of activity pacing and energy conservation with functional activities.  Provided handout with visual reference of available equipment.  Therapeutic Goal(s):  Verbalize and demonstrate safe technique for tub/shower transfers with DME/adaptive equipment as needed. Verbalize and demonstrate appropriate use of adaptive equipment with bathing, dressing and ADL routine. Verbalize and demonstrate appropriate use of activity pacing and energy conservation with bathing, dressing and ADL routine.  Individual Participation: Pt participated in group and provided examples of AE use after a hip replacement   Participation Level: Engaged   Participation Quality: Independent   Behavior: Alert and Appropriate   Speech/Thought Process: Organized   Affect/Mood: Appropriate   Insight: Good   Judgement: Good   Modes of Intervention: Discussion, Education, Problem-solving, and Role-play  Patient Response to Interventions:  Attentive and Engaged   Plan: Continue to engage patient in PT/OT groups 1 - 2x/week.  01/21/2024  Therisa CROME Lavonda, OT Therisa Lavonda, OTD OTR/L  01/21/24, 2:31 PM

## 2024-01-21 NOTE — Group Note (Signed)
 Date:  01/21/2024 Time:  10:47 AM  Group Topic/Focus:  Fresh air Therapy outdoors with music and conversation.    Participation Level:  Active  Participation Quality:  Appropriate  Affect:  Appropriate  Cognitive:  Appropriate  Insight: Appropriate  Engagement in Group:  Engaged  Modes of Intervention:  Socialization and music  Additional Comments:  none  James [name] Bias 01/21/2024, 10:47 AM

## 2024-01-21 NOTE — Group Note (Signed)
 Date:  01/21/2024 Time:  8:30 PM  Group Topic/Focus:  Making Healthy Choices:   The focus of this group is to help patients identify negative/unhealthy choices they were using prior to admission and identify positive/healthier coping strategies to replace them upon discharge.    Participation Level:  Active  Participation Quality:  Appropriate  Affect:  Appropriate  Cognitive:  Appropriate  Insight: Appropriate and Good  Engagement in Group:  Engaged  Modes of Intervention:  Discussion  Additional Comments:    Romero Earnie Hope 01/21/2024, 8:30 PM
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
## 2024-01-21 NOTE — Plan of Care (Signed)
   Problem: Health Behavior/Discharge Planning: Goal: Identification of resources available to assist in meeting health care needs will improve Outcome: Progressing Goal: Compliance with treatment plan for underlying cause of condition will improve Outcome: Progressing

## 2024-01-22 DIAGNOSIS — R4189 Other symptoms and signs involving cognitive functions and awareness: Secondary | ICD-10-CM | POA: Diagnosis not present

## 2024-01-22 LAB — GLUCOSE, CAPILLARY
Glucose-Capillary: 107 mg/dL — ABNORMAL HIGH (ref 70–99)
Glucose-Capillary: 168 mg/dL — ABNORMAL HIGH (ref 70–99)
Glucose-Capillary: 180 mg/dL — ABNORMAL HIGH (ref 70–99)
Glucose-Capillary: 145 mg/dL — ABNORMAL HIGH (ref 70–99)

## 2024-01-22 NOTE — Progress Notes (Signed)
   01/22/24 0800  Psych Admission Type (Psych Patients Only)  Admission Status Voluntary  Psychosocial Assessment  Patient Complaints None  Eye Contact Brief  Facial Expression Animated  Affect Anxious  Speech Logical/coherent  Interaction Assertive  Motor Activity Slow  Appearance/Hygiene Unremarkable  Behavior Characteristics Cooperative  Mood Pleasant  Thought Process  Coherency WDL  Content WDL  Delusions None reported or observed  Perception WDL  Hallucination None reported or observed  Judgment WDL  Confusion None  Danger to Self  Current suicidal ideation? Denies  Agreement Not to Harm Self Yes  Description of Agreement verbal  Danger to Others  Danger to Others None reported or observed

## 2024-01-22 NOTE — Group Note (Signed)
 Date:  01/22/2024 Time:  2:47 PM  Group Topic/Focus:  Movement Therapy    Participation Level:  Did Not Attend   Norleen SHAUNNA Bias 01/22/2024, 2:47 PM

## 2024-01-22 NOTE — Group Note (Unsigned)
 Date:  01/22/2024 Time:  2:51 PM  Group Topic/Focus:  Movement Therapy     Participation Level:  {B[facility] PARTICIPATION OZCZO:77735}  Participation Quality:  {B[facility] PARTICIPATION QUALITY:22265}  Affect:  {[facility] AFFECT:22266}  Cognitive:  {[facility] COGNITIVE:22267}  Insight: {BHH Insight2:20797}  Engagement in Group:  {[facility] ENGAGEMENT IN HMNLE:77731}  Modes of Intervention:  {B[facility] MODES OF INTERVENTION:22269}  Additional Comments:  ***  Norleen SHAUNNA Bias 01/22/2024, 2:51 PM

## 2024-01-22 NOTE — Plan of Care (Signed)
  Problem: Coping: Goal: Ability to verbalize frustrations and anger appropriately will improve Outcome: Progressing   

## 2024-01-22 NOTE — Plan of Care (Signed)
   Problem: Safety: Goal: Periods of time without injury will increase Outcome: Progressing   Problem: Coping: Goal: Coping ability will improve Outcome: Progressing

## 2024-01-22 NOTE — Progress Notes (Signed)
 Progress Note Date: January 21, 2024  The patient states he is feeling better, with the only remaining complaint being a slight headache. He explicitly denies any suicidal or homicidal ideation. Nursing staff corroborate this assessment, reporting marked improvement and no significant concerns. On examination, the patient is alert and oriented. His condition is stable, and he has been discharged.  Objective:  Blood pressure 122/87, pulse 81, temperature (!) 97.3 F (36.3 C), resp. rate 18, height 5' 11 (1.803 m), weight 104.3 kg, SpO2 99%.   Psychiatric Specialty Exam:   patient is lying in the bed alert oriented x 3 mood is okay affect is full thought process logical and coherent denies any suicidal thoughts no perceptual disturbances reported Insight and judgment fair. Executive Functions: Teaching laboratory technician, [employer], [employer], [employer], [employer].  Psychomotor Activity: No data recorded.  Musculoskeletal: Strength & Muscle Tone within normal limits. Gait & Station normal.  Assets: Manufacturing systems engineer, Desire for Improvement, Resilience, Social Support.  Collateral (01/19/24): Wife and son Worth reported acute personality changes since December, including giving $48,000 to a male, followed by texting/sending money to another male whom he traveled to [place]  to meet (she was from [place]). Patient continues to send money despite her refusal to move in. Last week, patient involved in multiple internet scams, sending thousands of dollars with promises of large returns. Family expresses concern about financial management and new impulsive, high-risk behaviors. Wife describes emotional lability, crying, and suicidal statements, all uncharacteristic. Provider discussed neurocognitive assessment with neurologist to rule out early onset dementia or cognitive impairment.  Sleep: Fair.  Appetite: Fair.  Labs (last 48 hrs): Capillary glucose readings consistently  elevated (181-224 mg/dL), with fasting comments.  Recent Labs: HGBA1C 9.1 (10/19/2023). Cholesterol, Triglycerides, HDL, VLDL within range except for VLDL (H) 48.0 on 01/20/2023. Blood alcohol <15 on 01/16/2024.  Medications: Currently on Cymbalta , Depakote , Klonopin  (reduced to 0.25mg  QHS), among others.  Assessment: 68 year old male admitted voluntarily to [facility] psych unit with Q15 min safety monitoring following statements of self-harm and ongoing psychosocial stressors. Patient presents with impaired judgment and lack of insight into recent high-risk, impulsive behaviors (financial scams, giving money to strangers, rapid relationship changes), which are a significant change from his baseline personality according to family. Mood and affect are depressed and flat despite his denial of current depression. Chronic medical conditions include orthostatic hypotension, chronic headaches (missed Botox ), diabetes (poorly controlled as evidenced by elevated A1C and capillary glucose readings), and history of multiple neurological issues (hydrocephalus, shunts, neuromuscular disorder/neuropathy). The acute onset of behavioral and judgment changes, coupled with family's concerns, strongly suggests the need for a comprehensive neurocognitive evaluation to rule out underlying organic causes, such as early onset dementia or other cognitive impairment. Patient denies active suicidal ideation, but history of recent statements and current stressors warrant continued close monitoring.  Plan:  Safety and Monitoring:  Continue voluntary admission to inpatient psychiatric unit with q15 minute checks.  Daily contact and assessment of symptoms and progress.  Case to be discussed in multidisciplinary team meeting.  Vital signs q12 hours.  Continue suicide, elopement, and assault precautions.  Psychiatric Diagnoses and Treatment:  Continue Cymbalta  120 mg oral daily.  Continue Depakote  ER 500 mg oral QHS for  mood stabilization.  Klonopin  reduced to 0.25 mg oral QHS due to orthostatic hypotension.  Discussed risks/benefits/side-effects/alternatives of medications with patient; patient consents.  Monitor metabolic profile and EKG (BMI, Lipid Panel, HbgA1c, QTc) while on atypical antipsychotic.  Encourage participation in unit  milieu and scheduled group therapies.  Medical Management:  Continue current medical regimen.  Monitor blood glucose closely and adjust insulin  aspart as needed based on readings and provider orders.  Follow up on headache and missed Botox  injections; coordinate with neurology for future management.  Continue to monitor orthostatic hypotension and dizziness.  Discharge Planning:  Social work and case management to assist with discharge planning and identification of hospital follow-up needs.  Emphasize and facilitate neurocognitive assessment with neurology post-discharge to investigate acute behavioral changes.  Estimated length of stay: 3-4 days.

## 2024-01-22 NOTE — Group Note (Signed)
 Date:  01/22/2024 Time:  9:29 PM  Group Topic/Focus:  Wrap-Up Group:   The focus of this group is to help patients review their daily goal of treatment and discuss progress on daily workbooks.    Participation Level:  Active  Participation Quality:  Appropriate  Affect:  Appropriate  Cognitive:  Alert  Insight: Appropriate  Engagement in Group:  Engaged  Modes of Intervention:  Discussion  Additional Comments:    James [name] 01/22/2024, 9:29 PM

## 2024-01-23 DIAGNOSIS — R4189 Other symptoms and signs involving cognitive functions and awareness: Secondary | ICD-10-CM | POA: Diagnosis not present

## 2024-01-23 LAB — GLUCOSE, CAPILLARY
Glucose-Capillary: 129 mg/dL — ABNORMAL HIGH (ref 70–99)
Glucose-Capillary: 165 mg/dL — ABNORMAL HIGH (ref 70–99)
Glucose-Capillary: 111 mg/dL — ABNORMAL HIGH (ref 70–99)
Glucose-Capillary: 151 mg/dL — ABNORMAL HIGH (ref 70–99)

## 2024-01-23 NOTE — Group Note (Signed)
 Date:  01/23/2024 Time:  5:44 PM  Group Topic/Focus:  Healthy Communication:   The focus of this group is to discuss communication, barriers to communication, as well as healthy ways to communicate with others. Making Healthy Choices:   The focus of this group is to help patients identify negative/unhealthy choices they were using prior to admission and identify positive/healthier coping strategies to replace them upon discharge. This group focused on healthy eating. We discussed healthy recipes, sweet treats, snacks, drinks and tips to improve the patients overall daily lifestyle.     Participation Level:  Active  Participation Quality:  Appropriate  Affect:  Appropriate  Cognitive:  Appropriate  Insight: Appropriate  Engagement in Group:  Engaged  Modes of Intervention:  Activity, Discussion, and Education  Additional Comments:    Jaqua Ching L Teo Moede 01/23/2024, 5:44 PM

## 2024-01-23 NOTE — Progress Notes (Signed)
 Patient was cooperative with treatment,  medication compliant, he denies SI, HI & AVH, during the evening he was visible in the milieu. Patient seemed to sleep well through out the night.

## 2024-01-23 NOTE — Progress Notes (Signed)
   01/23/24 2200  Psych Admission Type (Psych Patients Only)  Admission Status Voluntary  Psychosocial Assessment  Patient Complaints None  Eye Contact Brief  Facial Expression Animated  Affect Anxious  Speech Logical/coherent  Interaction Assertive  Motor Activity Slow  Appearance/Hygiene Unremarkable  Behavior Characteristics Cooperative  Mood Pleasant  Thought Process  Coherency WDL  Content WDL  Delusions None reported or observed  Perception WDL  Hallucination None reported or observed  Judgment WDL  Confusion None  Danger to Self  Current suicidal ideation? Denies  Agreement Not to Harm Self Yes  Description of Agreement Verbal  Danger to Others  Danger to Others None reported or observed

## 2024-01-23 NOTE — Group Note (Signed)
 Date:  01/23/2024 Time:  9:31 PM  Group Topic/Focus:  Wrap-Up Group:   The focus of this group is to help patients review their daily goal of treatment and discuss progress on daily workbooks.    Participation Level:  Active  Participation Quality:  Appropriate  Affect:  Appropriate  Cognitive:  Appropriate  Insight: Appropriate  Engagement in Group:  Engaged  Modes of Intervention:  Discussion  Additional Comments:    James [name] 01/23/2024, 9:31 PM

## 2024-01-23 NOTE — Progress Notes (Signed)
 Progress Note   The patient states he is feeling better, with the only remaining complaint being a slight headache. He explicitly denies any suicidal or homicidal ideation. Nursing staff corroborate this assessment, reporting marked improvement and no significant concerns.  Objective:  Blood pressure 114/78, pulse 71, temperature 98 F (36.7 C), resp. rate 17, height 5' 11 (1.803 m), weight 104.3 kg, SpO2 94%.   Psychiatric Specialty Exam:   patient is lying in the bed alert oriented x 3 mood is okay affect is full thought process logical and coherent denies any suicidal thoughts no perceptual disturbances reported Insight and judgment fair. Executive Functions: Teaching laboratory technician, [employer], [employer], [employer], [employer].  Psychomotor Activity: No data recorded.  Musculoskeletal: Strength & Muscle Tone within normal limits. Gait & Station normal.  Assets: Manufacturing systems engineer, Desire for Improvement, Resilience, Social Support.  Collateral (01/19/24): Wife and son Worth reported acute personality changes since December, including giving $48,000 to a male, followed by texting/sending money to another male whom he traveled to [place]  to meet (she was from [place]). Patient continues to send money despite her refusal to move in. Last week, patient involved in multiple internet scams, sending thousands of dollars with promises of large returns. Family expresses concern about financial management and new impulsive, high-risk behaviors. Wife describes emotional lability, crying, and suicidal statements, all uncharacteristic. Provider discussed neurocognitive assessment with neurologist to rule out early onset dementia or cognitive impairment.  Sleep: Fair.  Appetite: Fair.  Labs (last 48 hrs): Capillary glucose readings consistently elevated (181-224 mg/dL), with fasting comments.  Recent Labs: HGBA1C 9.1 (10/19/2023). Cholesterol, Triglycerides, HDL, VLDL  within range except for VLDL (H) 48.0 on 01/20/2023. Blood alcohol <15 on 01/16/2024.  Medications: Currently on Cymbalta , Depakote , Klonopin  (reduced to 0.25mg  QHS), among others.  Assessment: 68 year old male admitted voluntarily to [facility] psych unit with Q15 min safety monitoring following statements of self-harm and ongoing psychosocial stressors. Patient presents with impaired judgment and lack of insight into recent high-risk, impulsive behaviors (financial scams, giving money to strangers, rapid relationship changes), which are a significant change from his baseline personality according to family. Mood and affect are depressed and flat despite his denial of current depression. Chronic medical conditions include orthostatic hypotension, chronic headaches (missed Botox ), diabetes (poorly controlled as evidenced by elevated A1C and capillary glucose readings), and history of multiple neurological issues (hydrocephalus, shunts, neuromuscular disorder/neuropathy). The acute onset of behavioral and judgment changes, coupled with family's concerns, strongly suggests the need for a comprehensive neurocognitive evaluation to rule out underlying organic causes, such as early onset dementia or other cognitive impairment. Patient denies active suicidal ideation, but history of recent statements and current stressors warrant continued close monitoring.  Plan:  Safety and Monitoring:  Continue voluntary admission to inpatient psychiatric unit with q15 minute checks.  Daily contact and assessment of symptoms and progress.  Case to be discussed in multidisciplinary team meeting.  Vital signs q12 hours.  Continue suicide, elopement, and assault precautions.  Psychiatric Diagnoses and Treatment:  Continue Cymbalta  120 mg oral daily.  Continue Depakote  ER 500 mg oral QHS for mood stabilization.  Klonopin  reduced to 0.25 mg oral QHS due to orthostatic hypotension.  Discussed  risks/benefits/side-effects/alternatives of medications with patient; patient consents.  Monitor metabolic profile and EKG (BMI, Lipid Panel, HbgA1c, QTc) while on atypical antipsychotic.  Encourage participation in unit milieu and scheduled group therapies.  Medical Management:  Continue current medical regimen.  Monitor blood glucose closely and adjust insulin   aspart as needed based on readings and provider orders.  Follow up on headache and missed Botox  injections; coordinate with neurology for future management.  Continue to monitor orthostatic hypotension and dizziness.  Discharge Planning:  Social work and case management to assist with discharge planning and identification of hospital follow-up needs.  Emphasize and facilitate neurocognitive assessment with neurology post-discharge to investigate acute behavioral changes.  Estimated length of stay: 3-4 days.

## 2024-01-23 NOTE — Group Note (Signed)
 LCSW Group Therapy Note  Group Date: 01/23/2024 Start Time: 1020 End Time: 1120   Type of Therapy and Topic:  Group Therapy - Healthy vs Unhealthy Coping Skills  Participation Level:  Active   Description of Group The focus of this group was to determine what unhealthy coping techniques typically are used by group members and what healthy coping techniques would be helpful in coping with various problems. Patients were guided in becoming aware of the differences between healthy and unhealthy coping techniques. Patients were asked to identify 2-3 healthy coping skills they would like to learn to use more effectively.  Therapeutic Goals Patients learned that coping is what human beings do all day long to deal with various situations in their lives Patients defined and discussed healthy vs unhealthy coping techniques Patients identified their preferred coping techniques and identified whether these were healthy or unhealthy Patients determined 2-3 healthy coping skills they would like to become more familiar with and use more often. Patients provided support and ideas to each other   Summary of Patient Progress:  During group, patient expressed how he misses his wife and is lonely at this time. Patient proved open to input from peers and feedback from CSW. Patient demonstrated great insight into the subject matter, was respectful of peers, and participated throughout the entire session.   Therapeutic Modalities Cognitive Behavioral Therapy Motivational Interviewing  Donnice LELON Favor, LCSWA 01/23/2024  11:25 AM

## 2024-01-23 NOTE — Plan of Care (Signed)
   Problem: Activity: Goal: Interest or engagement in activities will improve Outcome: Progressing Goal: Sleeping patterns will improve Outcome: Progressing

## 2024-01-23 NOTE — Progress Notes (Signed)
   01/23/24 0900  Psych Admission Type (Psych Patients Only)  Admission Status Voluntary  Psychosocial Assessment  Patient Complaints None  Eye Contact Brief  Facial Expression Animated  Affect Anxious  Speech Logical/coherent  Interaction Assertive  Motor Activity Slow  Appearance/Hygiene Unremarkable  Behavior Characteristics Cooperative  Mood Pleasant  Thought Process  Coherency WDL  Content WDL  Delusions None reported or observed  Perception WDL  Hallucination None reported or observed  Judgment WDL  Confusion None  Danger to Self  Current suicidal ideation? Denies  Agreement Not to Harm Self Yes  Description of Agreement verbal  Danger to Others  Danger to Others None reported or observed

## 2024-01-23 NOTE — Plan of Care (Signed)
  Problem: Coping: Goal: Ability to verbalize frustrations and anger appropriately will improve Outcome: Progressing   

## 2024-01-23 NOTE — BHH Suicide Risk Assessment (Signed)
 B[facility] INPATIENT:  Family/Significant Other Suicide Prevention Education  Suicide Prevention Education:  Education Completed; with the patients ex-wife wanda, has been identified by the patient as the family member/significant other with whom the patient will be residing, and identified as the person(s) who will aid the patient in the event of a mental health crisis (suicidal ideations/suicide attempt).  With written consent from the patient, the family member/significant other has been provided the following suicide prevention education, prior to the and/or following the discharge of the patient.  The suicide prevention education provided includes the following: Suicide risk factors Suicide prevention and interventions National Suicide Hotline telephone number [facility] assessment telephone number [facility] Emergency Assistance 911 [facility] and/or Residential Mobile Crisis Unit telephone number  Request made of family/significant other to: Remove weapons (e.g., guns, rifles, knives), all items previously/currently identified as safety concern.   Remove drugs/medications (over-the-counter, prescriptions, illicit drugs), all items previously/currently identified as a safety concern.   [name] the patients ex- wife reported  to this CSW that the patient had guns however she took them from his home. The wife reports that she currently has access to the guns. Wife also reports that the patient can no longer be in charge of his finances and their son in [place]  has agreed to take over. The wife stated that although they are divorced she will continue to support him on getting the help he needs as they were married 38 years with two children.   The family member/significant other verbalizes understanding of the suicide prevention education information provided.  The family member/significant other agrees to remove the items of safety concern listed above.  Donnice ORN  Quinlin Conant 01/23/2024, 1:38 PM

## 2024-01-24 ENCOUNTER — Other Ambulatory Visit (HOSPITAL_BASED_OUTPATIENT_CLINIC_OR_DEPARTMENT_OTHER): Payer: Self-pay

## 2024-01-24 ENCOUNTER — Encounter: Payer: Self-pay | Admitting: Neurology

## 2024-01-24 LAB — GLUCOSE, CAPILLARY
Glucose-Capillary: 96 mg/dL (ref 70–99)
Glucose-Capillary: 174 mg/dL — ABNORMAL HIGH (ref 70–99)
Glucose-Capillary: 154 mg/dL — ABNORMAL HIGH (ref 70–99)
Glucose-Capillary: 140 mg/dL — ABNORMAL HIGH (ref 70–99)

## 2024-01-24 MED ORDER — MELATONIN 5 MG PO TABS
5.0000 mg | ORAL_TABLET | Freq: Every day | ORAL | 0 refills | Status: AC
  Filled 2024-01-24: qty 30, 30d supply, fill #0
  Filled 2024-01-27: qty 90, 90d supply, fill #0

## 2024-01-24 MED ORDER — DULOXETINE HCL 60 MG PO CPEP
120.0000 mg | ORAL_CAPSULE | Freq: Every day | ORAL | 0 refills | Status: DC
  Filled 2024-01-24 – 2024-01-27 (×3): qty 60, 30d supply, fill #0

## 2024-01-24 MED ORDER — DIVALPROEX SODIUM ER 500 MG PO TB24
500.0000 mg | ORAL_TABLET | Freq: Every day | ORAL | 0 refills | Status: AC
  Filled 2024-01-24: qty 30, 30d supply, fill #0

## 2024-01-24 MED ORDER — CLONAZEPAM 0.25 MG PO TBDP
0.2500 mg | ORAL_TABLET | Freq: Every day | ORAL | 0 refills | Status: AC
  Filled 2024-01-24: qty 30, 30d supply, fill #0

## 2024-01-24 NOTE — Group Note (Signed)
 Date:  01/24/2024 Time:  9:46 AM  Group Topic/Focus:  Self Care:   The focus of this group is to help patients understand the importance of self-care in order to improve or restore emotional, physical, spiritual, interpersonal, and financial health.    Participation Level:  Active  Participation Quality:  Appropriate  Affect:  Appropriate  Cognitive:  Appropriate  Insight: Good  Engagement in Group:  Engaged  Modes of Intervention:  Discussion  Additional Comments:  N/A  Harlene LITTIE Gavel 01/24/2024, 9:46 AM

## 2024-01-24 NOTE — Discharge Instructions (Signed)
Plate Method for Diabetes  ° °Foods with carbohydrates make your blood glucose level go up. The plate method is a simple way to meal plan and control the amount of carbohydrate you eat. °    °    °Use the following guidance to build a healthy plate to control carbohydrates. Divide a 9-inch plate into 3 sections, and consider your beverage the 4th section of your meal: °Food Group Examples of Foods/Beverages for This Section of your Meal  °Section 1: Non-starchy vegetables °Fill ½ of your plate to include non-starchy vegetables Asparagus, broccoli, brussels sprouts, cabbage, carrots, cauliflower, celery, cucumber, green beans, mushrooms, peppers, salad greens, tomatoes, or zucchini.  °Section 2: Protein foods °Fill ¼ of your plate to include a lean protein Lean meat, poultry, fish, seafood, cheese, eggs, lean deli meat, tofu, beans, lentils, nuts or nut butters.  °Section 3: Carbohydrate foods °Fill ¼ of your plate to include carbohydrate foods Whole grains, whole wheat bread, brown rice, whole grain pasta, polenta, corn tortillas, fruit, or starchy vegetables (potatoes, green peas, corn, beans, acorn squash, and butternut squash). One cup of milk also counts as a food that contains carbohydrate.  °Section 4: Beverage °Choose water or a low-calorie drink for your beverage. Unsweetened tea, coffee, or flavored/sparkling water without added sugar.  °Image reprinted with permission from The American Diabetes Association.  Copyright 2022 by the American Diabetes Association. ° ° °Copyright 2022 © Academy of [employer] and Dietetics. All rights reserved   ° °Carbohydrate Counting For People With Diabetes ° °Foods with carbohydrates make your blood glucose level go up. Learning how to count carbohydrates can help you control your blood glucose levels. First, identify the foods you eat that contain carbohydrates. Then, using the Foods with Carbohydrates chart, determine about how much carbohydrates are in your meals and  snacks. Make sure you are eating foods with fiber, protein, and healthy fat along with your carbohydrate foods. °Foods with Carbohydrates °The following table shows carbohydrate foods that have about 15 grams of carbohydrate each. Using measuring cups, spoons, or a food scale when you first begin learning about carbohydrate counting can help you learn about the portion sizes you typically eat. °The following foods have 15 grams carbohydrate each:  °Grains °1 slice bread (1 ounce)  °1 small tortilla (6-inch size)  °¼ large bagel (1 ounce)  °1/3 cup pasta or rice (cooked)  °½ hamburger or hot dog bun (¾ ounce)  °½ cup cooked cereal  °½ to ¾ cup ready-to-eat cereal  °2 taco shells (5-inch size) Fruit °1 small fresh fruit (¾ to 1 cup)  °½ medium banana  °17 small grapes (3 ounces)  °1 cup melon or berries  °½ cup canned or frozen fruit  °2 tablespoons dried fruit (blueberries, cherries, cranberries, raisins)  °½ cup unsweetened fruit juice  °Starchy Vegetables °½ cup cooked beans, peas, corn, potatoes/sweet potatoes  °¼ large baked potato (3 ounces)  °1 cup acorn or butternut squash  Snack Foods °3 to 6 crackers  °8 potato chips or 13 tortilla chips (¾ ounce to 1 ounce)  °3 cups popped popcorn  °Dairy °3/4 cup (6 ounces) nonfat plain yogurt, or yogurt with sugar-free sweetener  °1 cup milk  °1 cup plain rice, soy, coconut or flavored almond milk Sweets and Desserts °½ cup ice cream or frozen yogurt  °1 tablespoon jam, jelly, pancake syrup, table sugar, or honey  °2 tablespoons light pancake syrup  °1 inch square of frosted cake or 2 inch square of   unfrosted cake  °2 small cookies (2/3 ounce each) or ¼ large cookie  °Sometimes you’ll have to estimate carbohydrate amounts if you don’t know the exact recipe. One cup of mixed foods like soups can have 1 to 2 carbohydrate servings, while some casseroles might have 2 or more servings of carbohydrate. °Foods that have less than 20 calories in each serving can be counted as  “free” foods. Count 1 cup raw vegetables, or ½ cup cooked non-starchy vegetables as “free” foods. If you eat 3 or more servings at one meal, then count them as 1 carbohydrate serving.  °Foods without Carbohydrates  °Not all foods contain carbohydrates. Meat, some dairy, fats, non-starchy vegetables, and many beverages don’t contain carbohydrate. So when you count carbohydrates, you can generally exclude chicken, pork, beef, fish, seafood, eggs, tofu, cheese, butter, sour cream, avocado, nuts, seeds, olives, mayonnaise, water, black coffee, unsweetened tea, and zero-calorie drinks. Vegetables with no or low carbohydrate include green beans, cauliflower, tomatoes, and onions. °How much carbohydrate should I eat at each meal?  °Carbohydrate counting can help you plan your meals and manage your weight. Following are some starting points for carbohydrate intake at each meal. Work with your registered dietitian nutritionist to find the best range that works for your blood glucose and weight.  ° To Lose Weight To Maintain Weight  °Women 2 - 3 carb servings 3 - 4 carb servings  °Men 3 - 4 carb servings 4 - 5 carb servings  °Checking your blood glucose after meals will help you know if you need to adjust the timing, type, or number of carbohydrate servings in your meal plan. Achieve and keep a healthy body weight by balancing your food intake and physical activity. ° °Tips °How should I plan my meals?  °Plan for half the food on your plate to include non-starchy vegetables, like salad greens, broccoli, or carrots. Try to eat 3 to 5 servings of non-starchy vegetables every day. Have a protein food at each meal. Protein foods include chicken, fish, meat, eggs, or beans (note that beans contain carbohydrate). These two food groups (non-starchy vegetables and proteins) are low in carbohydrate. If you fill up your plate with these foods, you will eat less carbohydrate but still fill up your stomach. Try to limit your carbohydrate  portion to ¼ of the plate.  °What fats are healthiest to eat?  °Diabetes increases risk for heart disease. To help protect your heart, eat more healthy fats, such as olive oil, nuts, and avocado. Eat less saturated fats like butter, cream, and high-fat meats, like bacon and sausage. Avoid trans fats, which are in all foods that list “partially hydrogenated oil” as an ingredient. °What should I drink?  °Choose drinks that are not sweetened with sugar. The healthiest choices are water, carbonated or seltzer waters, and tea and coffee without added sugars.  °Sweet drinks will make your blood glucose go up very quickly. One serving of soda or energy drink is ½ cup. It is best to drink these beverages only if your blood glucose is low.  °Artificially sweetened, or diet drinks, typically do not increase your blood glucose if they have zero calories in them. Read labels of beverages, as some diet drinks do have carbohydrate and will raise your blood glucose. °Label Reading Tips °Read Nutrition Facts labels to find out how many grams of carbohydrate are in a food you want to eat. Don’t forget: sometimes serving sizes on the label aren’t the same as how much food   you are going to eat, so you may need to calculate how much carbohydrate is in the food you are serving yourself.  ° °Carbohydrate Counting for People with Diabetes Sample 1-Day Menu  °Breakfast ¾ cup yogurt, low fat, low sugar (1 carbohydrate serving)  °½ cup cereal, ready-to-eat, unsweetened (1 carbohydrate serving)  °1 cup strawberries (1 carbohydrate serving)  °¼ cup almonds (½ carbohydrate serving)  °Lunch 1, 5 ounce can chunk light tuna  °2 ounces cheese, low fat cheddar  °6 whole wheat crackers (1 carbohydrate serving)  °1 small apple (1½ carbohydrate servings)  °½ cup carrots (½ carbohydrate serving)  °½ cup snap peas  °1 cup 1% milk (1 carbohydrate serving)   °Evening Meal Stir fry made with: 3 ounces chicken  °1 cup brown rice (3 carbohydrate servings)  °½  cup broccoli (½ carbohydrate serving)  °½ cup green beans  °¼ cup onions  °1 tablespoon olive oil  °2 tablespoons teriyaki sauce (½ carbohydrate serving)  °Evening Snack 1 extra small banana (1 carbohydrate serving)  °1 tablespoon peanut butter  ° °Carbohydrate Counting for People with Diabetes Vegan Sample 1-Day Menu  °Breakfast 1 cup cooked oatmeal (2 carbohydrate servings)  °½ cup blueberries (1 carbohydrate serving)  °2 tablespoons flaxseeds  °1 cup soymilk fortified with calcium and vitamin D  °1 cup coffee  °Lunch 2 slices whole wheat bread (2 carbohydrate servings)  °½ cup baked tofu  °¼ cup lettuce  °2 slices tomato  °2 slices avocado  °½ cup baby carrots (½ carbohydrate serving)  °1 orange (1 carbohydrate serving)  °1 cup soymilk fortified with calcium and vitamin D   °Evening Meal Burrito made with: 1 6-inch corn tortilla (1 carbohydrate serving)  °1 cup refried vegetarian beans (2 carbohydrate servings)  °¼ cup chopped tomatoes  °¼ cup lettuce  °¼ cup salsa  °1/3 cup brown rice (1 carbohydrate serving)  °1 tablespoon olive oil for rice  °½ cup zucchini   °Evening Snack 6 small whole grain crackers (1 carbohydrate serving)  °2 apricots (½ carbohydrate serving)  °¼ cup unsalted peanuts (½ carbohydrate serving)   ° °Carbohydrate Counting for People with Diabetes Vegetarian (Lacto-Ovo) Sample 1-Day Menu  °Breakfast 1 cup cooked oatmeal (2 carbohydrate servings)  °½ cup blueberries (1 carbohydrate serving)  °2 tablespoons flaxseeds  °1 egg  °1 cup 1% milk (1 carbohydrate serving)  °1 cup coffee  °Lunch 2 slices whole wheat bread (2 carbohydrate servings)  °2 ounces low-fat cheese  °¼ cup lettuce  °2 slices tomato  °2 slices avocado  °½ cup baby carrots (½ carbohydrate serving)  °1 orange (1 carbohydrate serving)  °1 cup unsweetened tea  °Evening Meal Burrito made with: 1 6-inch corn tortilla (1 carbohydrate serving)  °½ cup refried vegetarian beans (1 carbohydrate serving)  °¼ cup tomatoes  °¼ cup lettuce  °¼  cup salsa  °1/3 cup brown rice (1 carbohydrate serving)  °1 tablespoon olive oil for rice  °½ cup zucchini  °1 cup 1% milk (1 carbohydrate serving)  °Evening Snack 6 small whole grain crackers (1 carbohydrate serving)  °2 apricots (½ carbohydrate serving)  °¼ cup unsalted peanuts (½ carbohydrate serving)   ° °Copyright 2020 © Academy of [employer] and [employer]etics. All rights reserved. ° °Using Nutrition Labels: Carbohydrate ° °Serving Size  °Look at the serving size. All the information on the label is based on this portion. °Servings Per Container  °The number of servings contained in the package. °Guidelines for Carbohydrate  °Look at   the total grams of carbohydrate in the serving size.  °1 carbohydrate choice = 15 grams of carbohydrate. °Range of Carbohydrate Grams Per Choice  °Carbohydrate Grams/Choice Carbohydrate Choices  °6-10 ½  °11-20 1  °21-25 1½  °26-35 2  °36-40 2½  °41-50 3  °51-55 3½  °56-65 4  °66-70 4½  °71-80 5  ° ° °Copyright 2020 © Academy of [employer] and Dietetics. All rights reserved.  °

## 2024-01-24 NOTE — Progress Notes (Addendum)
 Nutrition Brief Note  Patient identified on the Malnutrition Screening Tool (MST) Report  Wt Readings from Last 15 Encounters:  01/17/24 104.3 kg  01/16/24 99.8 kg  01/05/24 99.8 kg  01/04/24 99.3 kg  12/28/23 97.1 kg  12/28/23 99.5 kg  11/09/23 101.2 kg  11/08/23 104.3 kg  10/19/23 106.9 kg  10/04/23 106.2 kg  09/27/23 99.8 kg  09/14/23 105.2 kg  09/13/23 105.4 kg  09/10/23 104.1 kg  09/08/23 101.2 kg   Pt voluntarily admitted due to thoughts of self harm.   Pt admitted with MDD.  Pt currently on a carb modified diet. No meal completion data available. Pt refusing Ensure supplements.   Medications reviewed and include klonopin , neurotin, melatonin, and protonix .   Reviewed wt hx; pt has experienced a 2.4% wt loss over the past 3 months, which is not significant for time frame.   Lab Results  Component Value Date   HGBA1C 9.1 (A) 10/19/2023   PTA DM medications are 60 units insulin  lispro daily and 500 mg metformin  BID.   Labs reviewed: CBGS: 96-180 (inpatient orders for glycemic control are 0-15 units insulin  aspart TID with meals and 500 mg metformin  BID).    RD provided Plate Method and Carbohydrate Counting for People with Diabetes handouts from [facility] Nutrition Care Manual; attached to AVS/ discharge summary  RD also referred pt to [facility]'s Nutrition and Diabetes Education Services for further support and reinforcement.   Body mass index is 32.08 kg/m. Patient meets criteria for obesity, class I based on current BMI.   Current diet order is carb modified, patient is consuming approximately n/a% of meals at this time. Labs and medications reviewed.   No nutrition interventions warranted at this time. If nutrition issues arise, please consult RD.   Margery ORN, RD, LDN, CDCES Registered Dietitian III Certified Diabetes Care and Education Specialist If unable to reach this RD, please use RD Inpatient group chat on secure chat between hours of 8am-4 pm  daily

## 2024-01-24 NOTE — Progress Notes (Signed)
 [facility] MD Progress Note  01/24/2024 9:47 PM James [name]  MRN:  [id number]  Patient is a 68 year old male that presents this date voluntary to [facility] being brought in by his ex-wife after patient made statements to self-harm earlier this date. Patient denies any active plan or intent at the time of the assessment although stated earlier this date he had thoughts about overdosing on his medical medications or, do something else. Patient is admitted to [facility] unit with Q15 min safety monitoring. Multidisciplinary team approach is offered. Medication management; group/milieu therapy is offered.  Subjective:  Chart reviewed, case discussed in multidisciplinary meeting, patient seen during rounds.  Today on interview patient is noted to be resting in bed.  He remains discharge focused and wants to now not for his discharge planning.  Provided discussed at length about the concern for his cognitive decline given his chronic intracranial pressure and shunts in his brain.  Patient expresses understanding and the need to follow-up with his outpatient neurologist to get neurocognitive assessment as outpatient he denies SI/HI/plan and he denies auditory/visual hallucinations.  He reports that he agreed for his son to be his payee.  Provider called patient's wife and answered all the concerns and questions.  She informed the provider that she visited patient on Saturday and felt he improved in his mood.  Provider discussed the post discharge planning with the family.     Sleep: Fair  Appetite:  Fair  Past Psychiatric History: see h&P Family History:  Family History  Problem Relation Age of Onset   Healthy Mother    Lung cancer Father        former smoker   Heart disease Brother        MI age 75   Other Brother        Murdered   Down syndrome Son    Diabetes Neg Hx    Prostate cancer Neg Hx    Colon cancer Neg Hx    Stomach cancer Neg Hx    Pancreatic cancer Neg Hx    Liver disease Neg Hx     Social History:  Social History   Substance and Sexual Activity  Alcohol Use No     Social History   Substance and Sexual Activity  Drug Use No    Social History   Socioeconomic History   Marital status: Married    Spouse name: [name]   Number of children: 2   Years of education: Not on file   Highest education level: 12th grade  Occupational History   Occupation: disabled   Tobacco Use   Smoking status: Never   Smokeless tobacco: Never  Vaping Use   Vaping status: Never Used  Substance and Sexual Activity   Alcohol use: No   Drug use: No   Sexual activity: Yes    Partners: Female  Other Topics Concern   Not on file  Social History Narrative   Household-- pt , wife, one adult son with Down's syndrome   younger son lives in [place]   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   Social Drivers of Corporate investment banker Strain: Low Risk  (02/10/2023)   Overall Financial Resource Strain (CARDIA)    Difficulty of Paying Living Expenses: Not hard at all  Food Insecurity: No Food Insecurity (01/17/2024)   Hunger Vital Sign    Worried About Running Out of Food in the Last Year: Never true  Ran Out of Food in the Last Year: Never true  Transportation Needs: No Transportation Needs (01/17/2024)   PRAPARE - Administrator, Civil Service (Medical): No    Lack of Transportation (Non-Medical): No  Physical Activity: Unknown (02/10/2023)   Exercise Vital Sign    Days of Exercise per Week: 0 days    Minutes of Exercise per Session: Not on file  Stress: No Stress Concern Present (02/10/2023)   [employer] of Occupational Health - Occupational Stress Questionnaire    Feeling of Stress : Only a little  Social Connections: Moderately Integrated (01/17/2024)   Social Connection and Isolation Panel    Frequency of Communication with Friends and Family: More than three times a week    Frequency of Social Gatherings with  Friends and Family: Twice a week    Attends Religious Services: 1 to 4 times per year    Active Member of Clubs or Organizations: Yes    Attends Banker Meetings: 1 to 4 times per year    Marital Status: Divorced   Past Medical History:  Past Medical History:  Diagnosis Date   Abnormal cardiac CT angiography    Acid reflux    Annual physical exam 04/08/2015   Arthritis    Atypical chest pain 06/13/2020   Blood transfusion without reported diagnosis    Body mass index (BMI) 35.0-35.9, adult 04/05/2019   Chronic fatigue 01/28/2015   Chronic headaches    on cymbalta    Chronic kidney disease, stage 3b (HCC) 07/14/2023   Chronic migraine w/o aura, not intractable, w/o stat migr 10/24/2018   Cirrhosis (HCC)    Colon polyps    Complication of anesthesia    problems waking up from anesthesia   Coronary artery disease 01/23/2019   Depression    on cymbalta    Diabetes mellitus with neuropathy (HCC)    Diabetes with neuropathy 04/25/2013   Diverticulitis 03/2013   Dyslipidemia 05/29/2019   Eczema    Elevated LFTs    Epidural lipomatosis 10/05/2018   Essential (primary) hypertension 04/05/2019   Essential hypertension 10/10/2019   Fatty liver    GERD (gastroesophageal reflux disease) 04/28/2011   H/O craniotomy 05/07/2015   Headache 04/28/2011   Hepatitis 10/2017   NASH cirrhosis   History of kidney stones    Hyperlipidemia    Hypersomnia with sleep apnea 01/28/2015   Hypertension    IDA (iron  deficiency anemia) 01/24/2019   Idiopathic intracranial hypertension 01/14/2017   Insomnia 04/26/2013   Kidney stone    Liver cirrhosis secondary to NASH (nonalcoholic steatohepatitis) (HCC) 01/02/2016   Low back pain 04/05/2019   Lower back injury 08/14/2019   Morbid obesity (HCC)    Neuromuscular disorder (HCC)    neuropathy   Neuropathy    Nonalcoholic steatohepatitis 10/05/2018   Obstructive hydrocephalus (HCC) 01/28/2015   OSA -- dx ~ 2012, cpap intolerant  09/04/2014    dx ~ 2012, cpap intolerant    PCP NOTES >>> 04/08/2015   Post-op pain 03/19/2019   Post-traumatic hydrocephalus (HCC)    s/p shunts x 2 (first got infected )   Presence of cerebrospinal fluid drainage device 07/28/2011   Psoriasis    sees Dr Leanord   Psoriatic arthritis (HCC)    REM behavioral disorder 01/14/2017   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   see's Dr.Ortmann   Severe obesity (BMI >= 40) (HCC) 01/28/2015   SI (sacroiliac) joint dysfunction 08/14/2019   Sigmoid diverticulitis 04/25/2013   Sleep apnea  no CPAP      Spondylolisthesis, lumbar region 03/16/2019   Stomach ulcer    Testosterone  deficiency 04/28/2011   VP (ventriculoperitoneal) shunt status 07/31/2020    Past Surgical History:  Procedure Laterality Date   AMPUTATION Left 12/27/2021   Procedure: AMPUTATION GREAT TOE;  Surgeon: Gershon Donnice SAUNDERS, DPM;  Location: [facility] OR;  Service: Podiatry;  Laterality: Left;   BACK SURGERY  1980   BRAIN SURGERY     VP shunts placed in 2007   CHOLECYSTECTOMY N/A 08/25/2017   Procedure: LAPAROSCOPIC CHOLECYSTECTOMY WITH INTRAOPERATIVE CHOLANGIOGRAM;  Surgeon: Curvin Deward MOULD, MD;  Location: [facility] OR;  Service: General;  Laterality: N/A;   COLONOSCOPY     CORONARY STENT INTERVENTION N/A 09/27/2020   Procedure: CORONARY STENT INTERVENTION;  Surgeon: Dann Candyce RAMAN, MD;  Location: [facility] INVASIVE CV LAB;  Service: Cardiovascular;  Laterality: N/A;   CORONARY ULTRASOUND/IVUS N/A 09/27/2020   Procedure: Intravascular Ultrasound/IVUS;  Surgeon: Dann Candyce RAMAN, MD;  Location: [facility] INVASIVE CV LAB;  Service: Cardiovascular;  Laterality: N/A;   JOINT REPLACEMENT     total hip   LEFT HEART CATH N/A 09/27/2020   Procedure: Left Heart Cath;  Surgeon: Dann Candyce RAMAN, MD;  Location: [facility] INVASIVE CV LAB;  Service: Cardiovascular;  Laterality: N/A;   LEFT HEART CATH AND CORONARY ANGIOGRAPHY N/A 09/24/2020   Procedure: LEFT HEART CATH AND CORONARY ANGIOGRAPHY;   Surgeon: Dann Candyce RAMAN, MD;  Location: [facility] INVASIVE CV LAB;  Service: Cardiovascular;  Laterality: N/A;   LEFT HEART CATH AND CORONARY ANGIOGRAPHY N/A 08/18/2021   Procedure: LEFT HEART CATH AND CORONARY ANGIOGRAPHY;  Surgeon: Burnard Debby LABOR, MD;  Location: [facility] INVASIVE CV LAB;  Service: Cardiovascular;  Laterality: N/A;   LUMBAR FUSION  03/16/2019   SHOULDER SURGERY Left 2010   TEE WITHOUT CARDIOVERSION N/A 01/01/2022   Procedure: TRANSESOPHAGEAL ECHOCARDIOGRAM (TEE);  Surgeon: Tobb, Kardie, DO;  Location: [facility] ENDOSCOPY;  Service: Cardiovascular;  Laterality: N/A;   TOE SURGERY Left 2018   TONSILLECTOMY     as a child   TOTAL HIP ARTHROPLASTY Left 2011   UPPER GASTROINTESTINAL ENDOSCOPY  01/04/2020   VENTRICULOPERITONEAL SHUNT  2007   x2    Current Medications: Current Facility-Administered Medications  Medication Dose Route Frequency Provider Last Rate Last Admin   acetaminophen  (TYLENOL ) tablet 650 mg  650 mg Oral Q6H PRN Arloa Suzen RAMAN, NP   650 mg at 01/24/24 0947   alum & mag hydroxide-simeth (MAALOX/MYLANTA) 200-200-20 MG/5ML suspension 30 mL  30 mL Oral Q4H PRN Arloa Suzen RAMAN, NP       aspirin  chewable tablet 81 mg  81 mg Oral Daily Arloa Suzen RAMAN, NP   81 mg at 01/24/24 0945   atorvastatin  (LIPITOR ) tablet 80 mg  80 mg Oral QHS Arloa Suzen RAMAN, NP   80 mg at 01/24/24 2122   carvedilol  (COREG ) tablet 12.5 mg  12.5 mg Oral BID WC Arloa Suzen RAMAN, NP   12.5 mg at 01/24/24 1733   clonazePAM  (KLONOPIN ) disintegrating tablet 0.25 mg  0.25 mg Oral QHS Gayanne Prescott, MD   0.25 mg at 01/24/24 2124   dicyclomine  (BENTYL ) capsule 20 mg  20 mg Oral BID PRN Arloa Suzen RAMAN, NP       haloperidol  (HALDOL ) tablet 5 mg  5 mg Oral TID PRN Arloa Suzen RAMAN, NP       And   diphenhydrAMINE  (BENADRYL ) capsule 50 mg  50 mg Oral TID PRN Arloa Suzen RAMAN, NP  haloperidol  lactate (HALDOL ) injection 5 mg  5 mg Intramuscular TID PRN Arloa Suzen RAMAN, NP       And    diphenhydrAMINE  (BENADRYL ) injection 50 mg  50 mg Intramuscular TID PRN Arloa Suzen RAMAN, NP       And   LORazepam  (ATIVAN ) injection 2 mg  2 mg Intramuscular TID PRN Arloa Suzen RAMAN, NP       haloperidol  lactate (HALDOL ) injection 10 mg  10 mg Intramuscular TID PRN Arloa Suzen RAMAN, NP       And   diphenhydrAMINE  (BENADRYL ) injection 50 mg  50 mg Intramuscular TID PRN Arloa Suzen RAMAN, NP       And   LORazepam  (ATIVAN ) injection 2 mg  2 mg Intramuscular TID PRN Arloa Suzen RAMAN, NP       divalproex  (DEPAKOTE  ER) 24 hr tablet 500 mg  500 mg Oral QHS Keona Bilyeu, MD   500 mg at 01/24/24 2122   DULoxetine  (CYMBALTA ) DR capsule 120 mg  120 mg Oral Daily Arloa Suzen RAMAN, NP   120 mg at 01/24/24 0945   empagliflozin  (JARDIANCE ) tablet 25 mg  25 mg Oral Q breakfast Arloa Suzen RAMAN, NP   25 mg at 01/24/24 0945   feeding supplement (ENSURE PLUS HIGH PROTEIN) liquid 237 mL  237 mL Oral BID BM Adriana Quinby, MD   237 mL at 01/23/24 9077   gabapentin  (NEURONTIN ) capsule 600 mg  600 mg Oral BID Arloa Suzen RAMAN, NP   600 mg at 01/24/24 2122   hydrOXYzine  (ATARAX ) tablet 25 mg  25 mg Oral TID PRN Arloa Suzen RAMAN, NP   25 mg at 01/18/24 1417   insulin  aspart (novoLOG ) injection 0-15 Units  0-15 Units Subcutaneous TID WC Arloa Suzen RAMAN, NP   3 Units at 01/24/24 1733   isosorbide  mononitrate (IMDUR ) 24 hr tablet 120 mg  120 mg Oral Daily Arloa Suzen RAMAN, NP   120 mg at 01/24/24 0945   magnesium  hydroxide (MILK OF MAGNESIA) suspension 30 mL  30 mL Oral Daily PRN Arloa Suzen RAMAN, NP       melatonin tablet 5 mg  5 mg Oral QHS Arloa Suzen RAMAN, NP   5 mg at 01/24/24 2122   metFORMIN  (GLUCOPHAGE ) tablet 500 mg  500 mg Oral BID Arloa Suzen RAMAN, NP   500 mg at 01/24/24 2122   mupirocin  ointment (BACTROBAN ) 2 % 1 Application  1 Application Topical BID Alania Overholt, MD   1 Application at 01/24/24 2126   pantoprazole  (PROTONIX ) EC tablet 40 mg  40 mg Oral Daily Arloa Suzen RAMAN, NP   40 mg at 01/24/24 0945   traZODone  (DESYREL ) tablet 50 mg  50 mg Oral QHS PRN Arloa Suzen RAMAN, NP   50 mg at 01/24/24 2122    Lab Results:  Results for orders placed or performed during the hospital encounter of 01/17/24 (from the past 48 hours)  Glucose, capillary     Status: Abnormal   Collection Time: 01/23/24  7:28 AM  Result Value Ref Range   Glucose-Capillary 129 (H) 70 - 99 mg/dL    Comment: Glucose reference range applies only to samples taken after fasting for at least 8 hours.  Glucose, capillary     Status: Abnormal   Collection Time: 01/23/24 11:55 AM  Result Value Ref Range   Glucose-Capillary 165 (H) 70 - 99 mg/dL    Comment: Glucose reference range applies only to samples taken after fasting for at least 8  hours.  Glucose, capillary     Status: Abnormal   Collection Time: 01/23/24  4:04 PM  Result Value Ref Range   Glucose-Capillary 111 (H) 70 - 99 mg/dL    Comment: Glucose reference range applies only to samples taken after fasting for at least 8 hours.  Glucose, capillary     Status: Abnormal   Collection Time: 01/23/24  8:10 PM  Result Value Ref Range   Glucose-Capillary 151 (H) 70 - 99 mg/dL    Comment: Glucose reference range applies only to samples taken after fasting for at least 8 hours.  Glucose, capillary     Status: None   Collection Time: 01/24/24  7:32 AM  Result Value Ref Range   Glucose-Capillary 96 70 - 99 mg/dL    Comment: Glucose reference range applies only to samples taken after fasting for at least 8 hours.  Glucose, capillary     Status: Abnormal   Collection Time: 01/24/24 11:35 AM  Result Value Ref Range   Glucose-Capillary 174 (H) 70 - 99 mg/dL    Comment: Glucose reference range applies only to samples taken after fasting for at least 8 hours.  Glucose, capillary     Status: Abnormal   Collection Time: 01/24/24  4:34 PM  Result Value Ref Range   Glucose-Capillary 154 (H) 70 - 99 mg/dL    Comment: Glucose reference  range applies only to samples taken after fasting for at least 8 hours.  Glucose, capillary     Status: Abnormal   Collection Time: 01/24/24  8:09 PM  Result Value Ref Range   Glucose-Capillary 140 (H) 70 - 99 mg/dL    Comment: Glucose reference range applies only to samples taken after fasting for at least 8 hours.   *Note: Due to a large number of results and/or encounters for the requested time period, some results have not been displayed. A complete set of results can be found in Results Review.    Blood Alcohol level:  Lab Results  Component Value Date   [facility] <15 01/16/2024    Metabolic Disorder Labs: Lab Results  Component Value Date   HGBA1C 9.1 (A) 10/19/2023   MPG 139.85 08/15/2021   MPG 148.46 09/27/2020   Lab Results  Component Value Date   PROLACTIN 8.7 10/10/2014   Lab Results  Component Value Date   CHOL 132 01/20/2023   TRIG 145 08/20/2023   HDL 41.00 01/20/2023   CHOLHDL 3 01/20/2023   VLDL 48.0 (H) 01/20/2023   LDLCALC 68 01/30/2022   LDLCALC 55 08/16/2021    Physical Findings: AIMS:  , ,  ,  ,    CIWA:    COWS:      Psychiatric Specialty Exam:  Presentation  General Appearance:  Appropriate for Environment; Casual  Eye Contact: Fair  Speech: Clear and Coherent  Speech Volume: Normal    Mood and Affect  Mood: Depressed; Dysphoric  Affect: Depressed; Flat   Thought Process  Thought Processes: Coherent  Descriptions of Associations:Intact  Orientation:Full (Time, Place and Person)  Thought Content:Logical  Hallucinations:No data recorded   Ideas of Reference:None  Suicidal Thoughts:No data recorded   Homicidal Thoughts:No data recorded    Sensorium  Memory: Immediate Fair; Remote Fair; Recent Fair  Judgment: Impaired  Insight: None   Executive Functions  Concentration: Fair  Attention Span: Fair  Recall: [employer] of Knowledge: Fair  Language: Fair   Psychomotor Activity  Psychomotor  Activity: No data recorded  Musculoskeletal: Strength & Muscle  Tone: within normal limits Gait & Station: normal Assets  Assets: Manufacturing systems engineer; Desire for Improvement; Resilience; Social Support    Physical Exam: Physical Exam Vitals and nursing note reviewed.    ROS Blood pressure 114/70, pulse 79, temperature (!) 97.3 F (36.3 C), resp. rate 17, height 5' 11 (1.803 m), weight 104.3 kg, SpO2 99%. Body mass index is 32.08 kg/m.  Diagnosis: Principal Problem:   MDD (major depressive disorder), recurrent severe, without psychosis (HCC)   Clinical Decision Making: Patient with multiple psychosocial stressors including multiple break-ups, separation from wife, trust issues with his girlfriend's, spending away the money, admitted inpatient after making suicidal ideation and statements.    Treatment Plan Summary:   Safety and Monitoring:             -- Voluntary admission to inpatient psychiatric unit for safety, stabilization and treatment             -- Daily contact with patient to assess and evaluate symptoms and progress in treatment             -- Patient's case to be discussed in multi-disciplinary team meeting             -- Observation Level: q15 minute checks             -- Vital signs:  q12 hours             -- Precautions: suicide, elopement, and assault   2. Psychiatric Diagnoses and Treatment:               Continue Cymbalta .   Depakote  for mood stabilization.Klonopin  reduced to 0.25mg  at bedtime due to low BP.   -- The risks/benefits/side-effects/alternatives to this medication were discussed in detail with the patient and time was given for questions. The patient consents to medication trial.                -- Metabolic profile and EKG monitoring obtained while on an atypical antipsychotic (BMI: Lipid Panel: HbgA1c: QTc:)              -- Encouraged patient to participate in unit milieu and in scheduled group therapies                4. Discharge  Planning:   -- Social work and case management to assist with discharge planning and identification of hospital follow-up needs prior to discharge  -- Estimated LOS: 3-4 days  Allyn Foil, MD 01/24/2024, 9:47 PM

## 2024-01-24 NOTE — Group Note (Signed)
 Date:  01/24/2024 Time:  9:05 PM  Group Topic/Focus:  Managing Feelings:   The focus of this group is to identify what feelings patients have difficulty handling and develop a plan to handle them in a healthier way upon discharge. Primary and Secondary Emotions:   The focus of this group is to discuss the difference between primary and secondary emotions.    Participation Level:  Active  Participation Quality:  Appropriate  Affect:  Appropriate  Cognitive:  Appropriate  Insight: Appropriate  Engagement in Group:  Engaged  Modes of Intervention:  Discussion  Additional Comments:    Muhamad Serano L 01/24/2024, 9:05 PM

## 2024-01-24 NOTE — Progress Notes (Signed)
   01/24/24 2200  Psych Admission Type (Psych Patients Only)  Admission Status Voluntary  Psychosocial Assessment  Patient Complaints None  Eye Contact Brief  Facial Expression Animated  Affect Flat  Speech Logical/coherent  Interaction Assertive  Motor Activity Slow  Appearance/Hygiene Unremarkable  Behavior Characteristics Cooperative;Appropriate to situation  Mood Pleasant  Thought Process  Coherency WDL  Content WDL  Delusions None reported or observed  Perception WDL  Hallucination None reported or observed  Judgment WDL  Confusion None  Danger to Self  Current suicidal ideation? Denies  Agreement Not to Harm Self Yes  Description of Agreement verbal  Danger to Others  Danger to Others None reported or observed

## 2024-01-24 NOTE — Group Note (Signed)
 Recreation Therapy Group Note   Group Topic:Coping Skills  Group Date: 01/24/2024 Start Time: 1405 End Time: 1500 Facilitators: Celestia Jeoffrey BRAVO, LRT, CTRS Location: Dayroom  Group Description: Coping A-Z. LRT and patients engage in a guided discussion on what coping skills are and gave specific examples. LRT passed out a handout labeled Coping A-Z with blank spaces beside each letter. LRT prompted patients to come up with a coping skill for each of the letters. LRT and patients went over the handout and gave ideas for each letter if anyone had any blanks left on their paper. Patients kept this handout with them that listed 26 different coping skills.   Goal Area(s) Addressed: Patients will be able to define "coping skills". Patient will identify new coping skills.  Patient will increase communication.   Affect/Mood: Euthymic   Participation Level: Moderate and Active   Participation Quality: Minimal Cues   Behavior: Cooperative   Speech/Thought Process: Coherent   Insight: Fair   Judgement: Fair    Modes of Intervention: Education, Exploration, Worksheet, and Writing   Patient Response to Interventions:  Disengaged   Education Outcome:  In group clarification offered    Clinical Observations/Individualized Feedback: Abby was mostly active in their participation of session activities and group discussion. Pt seemed to be in a good mood as pt was very giggly and making jokes throughout group.    Plan: Continue to engage patient in RT group sessions 2-3x/week.   Jeoffrey BRAVO Celestia, LRT, CTRS 01/24/2024 4:27 PM

## 2024-01-24 NOTE — Progress Notes (Signed)
   01/24/24 1000  Psych Admission Type (Psych Patients Only)  Admission Status Voluntary  Psychosocial Assessment  Patient Complaints None  Eye Contact Brief  Facial Expression Animated  Affect Anxious  Speech Logical/coherent  Interaction Assertive  Motor Activity Slow  Appearance/Hygiene Unremarkable  Behavior Characteristics Cooperative  Mood Pleasant  Thought Process  Coherency WDL  Content WDL  Delusions None reported or observed  Perception WDL  Hallucination None reported or observed  Judgment WDL  Confusion None  Danger to Self  Current suicidal ideation? Denies  Agreement Not to Harm Self Yes  Description of Agreement verbal  Danger to Others  Danger to Others None reported or observed

## 2024-01-24 NOTE — Plan of Care (Signed)
   Problem: Education: Goal: Emotional status will improve Outcome: Progressing Goal: Mental status will improve Outcome: Progressing

## 2024-01-24 NOTE — Plan of Care (Signed)
  Problem: Education: Goal: Knowledge of [facility] Education information/materials will improve Outcome: Progressing Goal: Emotional status will improve Outcome: Progressing Goal: Mental status will improve Outcome: Progressing Goal: Verbalization of understanding the information provided will improve Outcome: Progressing   Problem: Activity: Goal: Interest or engagement in activities will improve Outcome: Progressing Goal: Sleeping patterns will improve Outcome: Progressing   Problem: Coping: Goal: Ability to verbalize frustrations and anger appropriately will improve Outcome: Progressing Goal: Ability to demonstrate self-control will improve Outcome: Progressing   Problem: Health Behavior/Discharge Planning: Goal: Identification of resources available to assist in meeting health care needs will improve Outcome: Progressing Goal: Compliance with treatment plan for underlying cause of condition will improve Outcome: Progressing   Problem: Physical Regulation: Goal: Ability to maintain clinical measurements within normal limits will improve Outcome: Progressing   Problem: Safety: Goal: Periods of time without injury will increase Outcome: Progressing   Problem: Coping: Goal: Coping ability will improve Outcome: Progressing

## 2024-01-24 NOTE — Plan of Care (Signed)
   Problem: Education: Goal: Knowledge of [facility] Education information/materials will improve Outcome: Progressing Goal: Emotional status will improve Outcome: Progressing Goal: Mental status will improve Outcome: Progressing Goal: Verbalization of understanding the information provided will improve Outcome: Progressing

## 2024-01-24 NOTE — Progress Notes (Signed)
   01/23/24 2200  Psych Admission Type (Psych Patients Only)  Admission Status Voluntary  Psychosocial Assessment  Patient Complaints None  Eye Contact Brief  Facial Expression Animated  Affect Anxious  Speech Logical/coherent  Interaction Assertive  Motor Activity Slow  Appearance/Hygiene Unremarkable  Behavior Characteristics Cooperative  Mood Pleasant  Thought Process  Coherency WDL  Content WDL  Delusions None reported or observed  Perception WDL  Hallucination None reported or observed  Judgment WDL  Confusion None  Danger to Self  Current suicidal ideation? Denies  Agreement Not to Harm Self Yes  Description of Agreement Verbal  Danger to Others  Danger to Others None reported or observed

## 2024-01-24 NOTE — BH IP Treatment Plan (Unsigned)
 Interdisciplinary Treatment and Diagnostic Plan Update  01/24/2024 Time of Session: 3:00 PM  James [name] MRN: [id number]  Principal Diagnosis: MDD (major depressive disorder), recurrent severe, without psychosis (HCC)  Secondary Diagnoses: Principal Problem:   MDD (major depressive disorder), recurrent severe, without psychosis (HCC)   Current Medications:  Current Facility-Administered Medications  Medication Dose Route Frequency Provider Last Rate Last Admin   acetaminophen  (TYLENOL ) tablet 650 mg  650 mg Oral Q6H PRN Arloa Suzen RAMAN, NP   650 mg at 01/24/24 0947   alum & mag hydroxide-simeth (MAALOX/MYLANTA) 200-200-20 MG/5ML suspension 30 mL  30 mL Oral Q4H PRN Arloa Suzen RAMAN, NP       aspirin  chewable tablet 81 mg  81 mg Oral Daily Arloa Suzen RAMAN, NP   81 mg at 01/24/24 0945   atorvastatin  (LIPITOR ) tablet 80 mg  80 mg Oral QHS Arloa Suzen RAMAN, NP   80 mg at 01/23/24 2122   carvedilol  (COREG ) tablet 12.5 mg  12.5 mg Oral BID WC Arloa Suzen RAMAN, NP   12.5 mg at 01/24/24 0945   clonazePAM  (KLONOPIN ) disintegrating tablet 0.25 mg  0.25 mg Oral QHS Jadapalle, Sree, MD   0.25 mg at 01/23/24 2121   dicyclomine  (BENTYL ) capsule 20 mg  20 mg Oral BID PRN Arloa Suzen RAMAN, NP       haloperidol  (HALDOL ) tablet 5 mg  5 mg Oral TID PRN Arloa Suzen RAMAN, NP       And   diphenhydrAMINE  (BENADRYL ) capsule 50 mg  50 mg Oral TID PRN Arloa Suzen RAMAN, NP       haloperidol  lactate (HALDOL ) injection 5 mg  5 mg Intramuscular TID PRN Arloa Suzen RAMAN, NP       And   diphenhydrAMINE  (BENADRYL ) injection 50 mg  50 mg Intramuscular TID PRN Arloa Suzen RAMAN, NP       And   LORazepam  (ATIVAN ) injection 2 mg  2 mg Intramuscular TID PRN Arloa Suzen RAMAN, NP       haloperidol  lactate (HALDOL ) injection 10 mg  10 mg Intramuscular TID PRN Arloa Suzen RAMAN, NP       And   diphenhydrAMINE  (BENADRYL ) injection 50 mg  50 mg Intramuscular TID PRN Arloa Suzen RAMAN, NP        And   LORazepam  (ATIVAN ) injection 2 mg  2 mg Intramuscular TID PRN Arloa Suzen RAMAN, NP       divalproex  (DEPAKOTE  ER) 24 hr tablet 500 mg  500 mg Oral QHS Jadapalle, Sree, MD   500 mg at 01/23/24 2122   DULoxetine  (CYMBALTA ) DR capsule 120 mg  120 mg Oral Daily Arloa Suzen RAMAN, NP   120 mg at 01/24/24 0945   empagliflozin  (JARDIANCE ) tablet 25 mg  25 mg Oral Q breakfast Arloa Suzen RAMAN, NP   25 mg at 01/24/24 0945   feeding supplement (ENSURE PLUS HIGH PROTEIN) liquid 237 mL  237 mL Oral BID BM Jadapalle, Sree, MD   237 mL at 01/23/24 9077   gabapentin  (NEURONTIN ) capsule 600 mg  600 mg Oral BID Arloa Suzen RAMAN, NP   600 mg at 01/24/24 0945   hydrOXYzine  (ATARAX ) tablet 25 mg  25 mg Oral TID PRN Arloa Suzen RAMAN, NP   25 mg at 01/18/24 1417   insulin  aspart (novoLOG ) injection 0-15 Units  0-15 Units Subcutaneous TID WC Arloa Suzen RAMAN, NP   3 Units at 01/24/24 1153   isosorbide  mononitrate (IMDUR ) 24 hr tablet 120 mg  120  mg Oral Daily Arloa Suzen RAMAN, NP   120 mg at 01/24/24 0945   magnesium  hydroxide (MILK OF MAGNESIA) suspension 30 mL  30 mL Oral Daily PRN Arloa Suzen RAMAN, NP       melatonin tablet 5 mg  5 mg Oral QHS Arloa Suzen RAMAN, NP   5 mg at 01/23/24 2122   metFORMIN  (GLUCOPHAGE ) tablet 500 mg  500 mg Oral BID Arloa Suzen RAMAN, NP   500 mg at 01/24/24 9053   mupirocin  ointment (BACTROBAN ) 2 % 1 Application  1 Application Topical BID Jadapalle, Sree, MD   1 Application at 01/23/24 2124   pantoprazole  (PROTONIX ) EC tablet 40 mg  40 mg Oral Daily Arloa Suzen RAMAN, NP   40 mg at 01/24/24 0945   traZODone  (DESYREL ) tablet 50 mg  50 mg Oral QHS PRN Arloa Suzen RAMAN, NP   50 mg at 01/23/24 2153   PTA Medications: Facility-Administered Medications Prior to Admission  Medication Dose Route Frequency Provider Last Rate Last Admin   botulinum toxin Type A  (BOTOX ) injection 155 Units  155 Units Intramuscular Once Sater, Charlie LABOR, MD       Medications Prior to  Admission  Medication Sig Dispense Refill Last Dose/Taking   acetaminophen  (TYLENOL ) 325 MG tablet Take 2 tablets (650 mg total) by mouth every 4 (four) hours as needed for headache or mild pain.      amLODipine  (NORVASC ) 5 MG tablet Take 1 tablet (5 mg total) by mouth daily. 90 tablet 2    aspirin  81 MG chewable tablet Chew 1 tablet (81 mg total) by mouth daily.      atorvastatin  (LIPITOR ) 80 MG tablet Take 1 tablet (80 mg total) by mouth at bedtime. 90 tablet 1    botulinum toxin Type A  (BOTOX ) 200 units injection Provider to inject 155 units into the muscles of the head and neck every 3 months. Discard remainder. (Patient taking differently: Inject 200 Units into the muscle every 3 (three) months. Provider to inject 155 units into the muscles of the head and neck every 3 months. Discard remainder.) 1 each 2    carvedilol  (COREG ) 12.5 MG tablet Take 1 tablet (12.5 mg total) by mouth 2 (two) times daily with a meal. 180 tablet 1    clonazePAM  (KLONOPIN ) 0.5 MG tablet Take 1 tablet (0.5 mg total) by mouth at bedtime. 30 tablet 5    Continuous Glucose Sensor (DEXCOM G6 SENSOR) MISC Use to monitor blood sugar, change after 10 days (Patient taking differently: 1 each by Other route See admin instructions. Use to monitor blood sugar, change after 10 days) 9 each 3    Continuous Glucose Transmitter (DEXCOM G6 TRANSMITTER) MISC Use as directed for 90 days 1 each 3    dicyclomine  (BENTYL ) 20 MG tablet Take 1 tablet (20 mg total) by mouth 2 (two) times daily as needed. (Patient taking differently: Take 20 mg by mouth 2 (two) times daily as needed for spasms.) 10 tablet 0    DULoxetine  (CYMBALTA ) 60 MG capsule Take 2 capsules (120 mg total) by mouth daily. 180 capsule 1    empagliflozin  (JARDIANCE ) 25 MG TABS tablet Take 1 tablet (25 mg total) by mouth daily with breakfast. 90 tablet 3    EPINEPHrine  (EPIPEN  2-PAK) 0.3 mg/0.3 mL IJ SOAJ injection Inject 0.3 mLs (0.3 mg total) into the muscle once as needed for  up to 1 dose for anaphylaxis. 2 each 2    gabapentin  (NEURONTIN ) 600 MG tablet Take 1 tablet (  600 mg total) by mouth 2 (two) times daily. 180 tablet 1    Insulin  Disposable Pump (OMNIPOD 5 G6 PODS, GEN 5,) MISC Use as directed every 3 (three) days. 10 each 3    insulin  lispro (HUMALOG ) 100 UNIT/ML injection Use up to 60 Units daily in pump 20 mL 2    Insulin  Syringe-Needle U-100 (INSULIN  SYRINGE 1CC/31GX5/16) 31G X 5/16 1 ML MISC Use to administer Humalog  3 times a day (Patient taking differently: 1 tablet by Other route See admin instructions. Use to administer Humalog  3 times a day) 100 each 0    isosorbide  mononitrate (IMDUR ) 120 MG 24 hr tablet Take 1 tablet (120 mg total) by mouth daily. 90 tablet 2    metFORMIN  (GLUCOPHAGE ) 500 MG tablet Take 1 tablet (500 mg total) by mouth 2 (two) times daily. 180 tablet 3    mupirocin  ointment (BACTROBAN ) 2 % Apply 1 Application topically 2 (two) times daily. 22 g 0    nitroGLYCERIN  (NITROSTAT ) 0.4 MG SL tablet Place 1 tablet (0.4 mg total) under the tongue every 5 (five) minutes for 3 doses as needed for chest pain. 25 tablet 6    ondansetron  (ZOFRAN -ODT) 4 MG disintegrating tablet Take 1 tablet (4 mg total) by mouth every 8 (eight) hours as needed. 20 tablet 0    pantoprazole  (PROTONIX ) 40 MG tablet Take 1 tablet (40 mg total) by mouth daily. 90 tablet 1    SKYRIZI PEN 150 MG/ML pen As directed       Patient Stressors: Marital or family conflict    Patient Strengths: Ability for insight  Active sense of humor  Communication skills   Treatment Modalities: Medication Management, Group therapy, Case management,  1 to 1 session with clinician, Psychoeducation, Recreational therapy.   Physician Treatment Plan for Primary Diagnosis: MDD (major depressive disorder), recurrent severe, without psychosis (HCC) Long Term Goal(s): Improvement in symptoms so as ready for discharge   Short Term Goals: Ability to identify changes in lifestyle to reduce  recurrence of condition will improve Ability to verbalize feelings will improve Ability to disclose and discuss suicidal ideas Ability to demonstrate self-control will improve Ability to identify and develop effective coping behaviors will improve  Medication Management: Evaluate patient's response, side effects, and tolerance of medication regimen.  Therapeutic Interventions: 1 to 1 sessions, Unit Group sessions and Medication administration.  Evaluation of Outcomes: Adequate for Discharge  Physician Treatment Plan for Secondary Diagnosis: Principal Problem:   MDD (major depressive disorder), recurrent severe, without psychosis (HCC)  Long Term Goal(s): Improvement in symptoms so as ready for discharge   Short Term Goals: Ability to identify changes in lifestyle to reduce recurrence of condition will improve Ability to verbalize feelings will improve Ability to disclose and discuss suicidal ideas Ability to demonstrate self-control will improve Ability to identify and develop effective coping behaviors will improve     Medication Management: Evaluate patient's response, side effects, and tolerance of medication regimen.  Therapeutic Interventions: 1 to 1 sessions, Unit Group sessions and Medication administration.  Evaluation of Outcomes: Adequate for Discharge   RN Treatment Plan for Primary Diagnosis: MDD (major depressive disorder), recurrent severe, without psychosis (HCC) Long Term Goal(s): Knowledge of disease and therapeutic regimen to maintain health will improve  Short Term Goals: Ability to remain free from injury will improve, Ability to verbalize frustration and anger appropriately will improve, Ability to demonstrate self-control, Ability to participate in decision making will improve, Ability to verbalize feelings will improve, Ability to disclose and  discuss suicidal ideas, Ability to identify and develop effective coping behaviors will improve, and Compliance with  prescribed medications will improve  Medication Management: RN will administer medications as ordered by provider, will assess and evaluate patient's response and provide education to patient for prescribed medication. RN will report any adverse and/or side effects to prescribing provider.  Therapeutic Interventions: 1 on 1 counseling sessions, Psychoeducation, Medication administration, Evaluate responses to treatment, Monitor vital signs and CBGs as ordered, Perform/monitor CIWA, COWS, AIMS and Fall Risk screenings as ordered, Perform wound care treatments as ordered.  Evaluation of Outcomes: Adequate for Discharge   LCSW Treatment Plan for Primary Diagnosis: MDD (major depressive disorder), recurrent severe, without psychosis (HCC) Long Term Goal(s): Safe transition to appropriate next level of care at discharge, Engage patient in therapeutic group addressing interpersonal concerns.  Short Term Goals: Engage patient in aftercare planning with referrals and resources, Increase social support, Increase ability to appropriately verbalize feelings, Increase emotional regulation, Facilitate acceptance of mental health diagnosis and concerns, Facilitate patient progression through stages of change regarding substance use diagnoses and concerns, Identify triggers associated with mental health/substance abuse issues, and Increase skills for wellness and recovery  Therapeutic Interventions: Assess for all discharge needs, 1 to 1 time with Social worker, Explore available resources and support systems, Assess for adequacy in community support network, Educate family and significant other(s) on suicide prevention, Complete Psychosocial Assessment, Interpersonal group therapy.  Evaluation of Outcomes: Adequate for Discharge   Progress in Treatment: Attending groups: Yes. and No. Participating in groups: Yes. and No. Taking medication as prescribed: Yes. Toleration medication: Yes. Family/Significant  other contact made: No, will contact:  CSW will contact if given permission  Patient understands diagnosis: Yes. Discussing patient identified problems/goals with staff: Yes. Medical problems stabilized or resolved: Yes. Denies suicidal/homicidal ideation: Yes. Issues/concerns per patient self-inventory: No. Other: None    New problem(s) identified: No, Describe:  None identified   New Short Term/Long Term Goal(s): elimination of symptoms of psychosis, medication management for mood stabilization; elimination of SI thoughts; development of comprehensive mental wellness plan.    Patient Goals:  Just trying to get better, this past week or two things came to a head, I couldn't take it anymore. Get up and get back to normal   Discharge Plan or Barriers: CSW will assist with appropriate discharge planning    Reason for Continuation of Hospitalization: Depression Medication stabilization   Estimated Length of Stay: 1 to 7 days Last 3 [place] Suicide Severity Risk Score: Flowsheet Row Admission (Current) from 01/17/2024 in [facility] ED from 01/16/2024 in [facility] Emergency Department at [facility] ED from 12/28/2023 in [facility] Emergency Department at [facility]  C-SSRS RISK CATEGORY Moderate Risk Low Risk No Risk    Last PHQ 2/9 Scores:    11/09/2023   11:34 AM 09/13/2023   11:24 AM 08/02/2023    9:55 AM  Depression screen PHQ 2/9  Decreased Interest 1 0 0  Down, Depressed, Hopeless 0 3 0  PHQ - 2 Score 1 3 0  Altered sleeping 1 3 0  Tired, decreased energy 2 3 0  Change in appetite 1 2 0  Feeling bad or failure about yourself  0 2 0  Trouble concentrating 0 2 0  Moving slowly or fidgety/restless 0 0 0  Suicidal thoughts 0 0 0  PHQ-9 Score 5 15 0    Scribe for Treatment Team: [name], [place] 01/24/2024 4:26 PM

## 2024-01-25 ENCOUNTER — Other Ambulatory Visit (HOSPITAL_BASED_OUTPATIENT_CLINIC_OR_DEPARTMENT_OTHER): Payer: Self-pay

## 2024-01-25 LAB — GLUCOSE, CAPILLARY
Glucose-Capillary: 113 mg/dL — ABNORMAL HIGH (ref 70–99)
Glucose-Capillary: 162 mg/dL — ABNORMAL HIGH (ref 70–99)

## 2024-01-25 NOTE — [facility] Suicide Risk Assessment (Signed)
 [facility] Discharge Suicide Risk Assessment   Principal Problem: MDD (major depressive disorder), recurrent severe, without psychosis (HCC) Discharge Diagnoses: Principal Problem:   MDD (major depressive disorder), recurrent severe, without psychosis (HCC)   Total Time spent with patient: 30 minutes  Musculoskeletal: Strength & Muscle Tone: within normal limits Gait & Station: normal Patient leans: N/A  Psychiatric Specialty Exam  Presentation  General Appearance:  Appropriate for Environment; Casual  Eye Contact: Fair  Speech: Clear and Coherent  Speech Volume: Normal  Handedness: Right   Mood and Affect  Mood: Euthymic  Duration of Depression Symptoms: Less than two weeks  Affect: Appropriate   Thought Process  Thought Processes: Coherent  Descriptions of Associations:Intact  Orientation:Full (Time, Place and Person)  Thought Content:Logical  History of Schizophrenia/Schizoaffective disorder:No  Duration of Psychotic Symptoms:No data recorded Hallucinations:Hallucinations: None  Ideas of Reference:None  Suicidal Thoughts:Suicidal Thoughts: No  Homicidal Thoughts:Homicidal Thoughts: No   Sensorium  Memory: Immediate Fair; Recent Fair  Judgment: Fair  Insight: Fair   Art therapist  Concentration: Fair  Attention Span: Fair  Recall: [employer] of Knowledge: Fair  Language: Fair   Psychomotor Activity  Psychomotor Activity: Psychomotor Activity: Normal   Assets  Assets: Communication Skills; Desire for Improvement; Resilience; Social Support   Sleep  Sleep: Sleep: Fair  Estimated Sleeping Duration (Last 24 Hours): 8.75-10.25 hours  Physical Exam: Physical Exam ROS Blood pressure 119/77, pulse 72, temperature 97.9 F (36.6 C), resp. rate 17, height 5' 11 (1.803 m), weight 104.3 kg, SpO2 96%. Body mass index is 32.08 kg/m.  Mental Status Per Nursing Assessment::   On Admission:  NA  Demographic  Factors:  Low socioeconomic status  Loss Factors: Decrease in vocational status  Historical Factors: Impulsivity  Risk Reduction Factors:   Living with another person, especially a relative, Positive social support, Positive therapeutic relationship, and Positive coping skills or problem solving skills  Continued Clinical Symptoms:  Depression:   Impulsivity  Cognitive Features That Contribute To Risk:  None    Suicide Risk:  Minimal: No identifiable suicidal ideation.  Patients presenting with no risk factors but with morbid ruminations; may be classified as minimal risk based on the severity of the depressive symptoms    Plan Of Care/Follow-up recommendations:  Activity:  As tolerated  Allyn Foil, MD 01/25/2024, 9:07 AM

## 2024-01-25 NOTE — Discharge Summary (Signed)
 Physician Discharge Summary Note  Patient:  James [name] is an 68 y.o., male MRN:  [id number] DOB:  [birth year] Patient phone:  [phone] (home)  Patient address:   [address] [place] [address],   Time spent 40 min Date of Admission:  01/17/2024 Date of Discharge: 01/25/24  Reason for Admission:  Patient is a 68 year old male that presents this date voluntary to [facility] being brought in by his ex-wife after patient made statements to self-harm earlier this date. Patient denies any active plan or intent at the time of the assessment although stated earlier this date he had thoughts about overdosing on his medical medications or, do something else. Patient is admitted to [facility] unit with Q15 min safety monitoring. Multidisciplinary team approach is offered. Medication management; group/milieu therapy is offered.   Principal Problem: MDD (major depressive disorder), recurrent severe, without psychosis (HCC) Discharge Diagnoses: Principal Problem:   MDD (major depressive disorder), recurrent severe, without psychosis (HCC)   Past Psychiatric History: see h&p  Family Psychiatric  History: see h&p Social History:  Social History   Substance and Sexual Activity  Alcohol Use No     Social History   Substance and Sexual Activity  Drug Use No    Social History   Socioeconomic History   Marital status: Married    Spouse name: James [name]   Number of children: 2   Years of education: Not on file   Highest education level: 12th grade  Occupational History   Occupation: disabled   Tobacco Use   Smoking status: Never   Smokeless tobacco: Never  Vaping Use   Vaping status: Never Used  Substance and Sexual Activity   Alcohol use: No   Drug use: No   Sexual activity: Yes    Partners: Female  Other Topics Concern   Not on file  Social History Narrative   Household-- pt , wife, one adult son with Down's syndrome   younger son lives in [place]   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   Social Drivers of Corporate investment banker Strain: Low Risk  (02/10/2023)   Overall Financial Resource Strain (CARDIA)    Difficulty of Paying Living Expenses: Not hard at all  Food Insecurity: No Food Insecurity (01/17/2024)   Hunger Vital Sign    Worried About Running Out of Food in the Last Year: Never true    Ran Out of Food in the Last Year: Never true  Transportation Needs: No Transportation Needs (01/17/2024)   PRAPARE - Administrator, Civil Service (Medical): No    Lack of Transportation (Non-Medical): No  Physical Activity: Unknown (02/10/2023)   Exercise Vital Sign    Days of Exercise per Week: 0 days    Minutes of Exercise per Session: Not on file  Stress: No Stress Concern Present (02/10/2023)   [employer] of Occupational Health - Occupational Stress Questionnaire    Feeling of Stress : Only a little  Social Connections: Moderately Integrated (01/17/2024)   Social Connection and Isolation Panel    Frequency of Communication with Friends and Family: More than three times a week    Frequency of Social Gatherings with Friends and Family: Twice a week    Attends Religious Services: 1 to 4 times per year    Active Member of [employer] or Organizations: Yes    Attends Banker Meetings: 1 to 4 times per year    Marital Status:  Divorced   Past Medical History:  Past Medical History:  Diagnosis Date   Abnormal cardiac CT angiography    Acid reflux    Annual physical exam 04/08/2015   Arthritis    Atypical chest pain 06/13/2020   Blood transfusion without reported diagnosis    Body mass index (BMI) 35.0-35.9, adult 04/05/2019   Chronic fatigue 01/28/2015   Chronic headaches    on cymbalta    Chronic kidney disease, stage 3b (HCC) 07/14/2023   Chronic migraine w/o aura, not intractable, w/o stat migr 10/24/2018   Cirrhosis (HCC)    Colon polyps    Complication of anesthesia     problems waking up from anesthesia   Coronary artery disease 01/23/2019   Depression    on cymbalta    Diabetes mellitus with neuropathy (HCC)    Diabetes with neuropathy 04/25/2013   Diverticulitis 03/2013   Dyslipidemia 05/29/2019   Eczema    Elevated LFTs    Epidural lipomatosis 10/05/2018   Essential (primary) hypertension 04/05/2019   Essential hypertension 10/10/2019   Fatty liver    GERD (gastroesophageal reflux disease) 04/28/2011   H/O craniotomy 05/07/2015   Headache 04/28/2011   Hepatitis 10/2017   NASH cirrhosis   History of kidney stones    Hyperlipidemia    Hypersomnia with sleep apnea 01/28/2015   Hypertension    IDA (iron  deficiency anemia) 01/24/2019   Idiopathic intracranial hypertension 01/14/2017   Insomnia 04/26/2013   Kidney stone    Liver cirrhosis secondary to NASH (nonalcoholic steatohepatitis) (HCC) 01/02/2016   Low back pain 04/05/2019   Lower back injury 08/14/2019   Morbid obesity (HCC)    Neuromuscular disorder (HCC)    neuropathy   Neuropathy    Nonalcoholic steatohepatitis 10/05/2018   Obstructive hydrocephalus (HCC) 01/28/2015   OSA -- dx ~ 2012, cpap intolerant 09/04/2014    dx ~ 2012, cpap intolerant    PCP NOTES >>> 04/08/2015   Post-op pain 03/19/2019   Post-traumatic hydrocephalus (HCC)    s/p shunts x 2 (first got infected )   Presence of cerebrospinal fluid drainage device 07/28/2011   Psoriasis    sees Dr Leanord   Psoriatic arthritis (HCC)    REM behavioral disorder 01/14/2017   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   see's Dr.Ortmann   Severe obesity (BMI >= 40) (HCC) 01/28/2015   SI (sacroiliac) joint dysfunction 08/14/2019   Sigmoid diverticulitis 04/25/2013   Sleep apnea    no CPAP      Spondylolisthesis, lumbar region 03/16/2019   Stomach ulcer    Testosterone  deficiency 04/28/2011   VP (ventriculoperitoneal) shunt status 07/31/2020    Past Surgical History:  Procedure Laterality Date    AMPUTATION Left 12/27/2021   Procedure: AMPUTATION GREAT TOE;  Surgeon: Gershon Donnice SAUNDERS, DPM;  Location: [facility] OR;  Service: Podiatry;  Laterality: Left;   BACK SURGERY  1980   BRAIN SURGERY     VP shunts placed in 2007   CHOLECYSTECTOMY N/A 08/25/2017   Procedure: LAPAROSCOPIC CHOLECYSTECTOMY WITH INTRAOPERATIVE CHOLANGIOGRAM;  Surgeon: Curvin Deward MOULD, MD;  Location: [facility] OR;  Service: General;  Laterality: N/A;   COLONOSCOPY     CORONARY STENT INTERVENTION N/A 09/27/2020   Procedure: CORONARY STENT INTERVENTION;  Surgeon: Dann Candyce RAMAN, MD;  Location: [facility] INVASIVE CV LAB;  Service: Cardiovascular;  Laterality: N/A;   CORONARY ULTRASOUND/IVUS N/A 09/27/2020   Procedure: Intravascular Ultrasound/IVUS;  Surgeon: Dann Candyce RAMAN, MD;  Location: [facility] INVASIVE CV LAB;  Service: Cardiovascular;  Laterality: N/A;  JOINT REPLACEMENT     total hip   LEFT HEART CATH N/A 09/27/2020   Procedure: Left Heart Cath;  Surgeon: Dann Candyce RAMAN, MD;  [facility] INVASIVE CV LAB;  Service: Cardiovascular;  Laterality: N/A;   LEFT HEART CATH AND CORONARY ANGIOGRAPHY N/A 09/24/2020   Procedure: LEFT HEART CATH AND CORONARY ANGIOGRAPHY;  Surgeon: Dann Candyce RAMAN, MD;  Location: [facility] INVASIVE CV LAB;  Service: Cardiovascular;  Laterality: N/A;   LEFT HEART CATH AND CORONARY ANGIOGRAPHY N/A 08/18/2021   Procedure: LEFT HEART CATH AND CORONARY ANGIOGRAPHY;  Surgeon: Burnard Debby LABOR, MD;  Location: [facility] INVASIVE CV LAB;  Service: Cardiovascular;  Laterality: N/A;   LUMBAR FUSION  03/16/2019   SHOULDER SURGERY Left 2010   TEE WITHOUT CARDIOVERSION N/A 01/01/2022   Procedure: TRANSESOPHAGEAL ECHOCARDIOGRAM (TEE);  Surgeon: Sheena Pugh, DO;  Location: [facility] ENDOSCOPY;  Service: Cardiovascular;  Laterality: N/A;   TOE SURGERY Left 2018   TONSILLECTOMY     as a child   TOTAL HIP ARTHROPLASTY Left 2011   UPPER GASTROINTESTINAL ENDOSCOPY  01/04/2020   VENTRICULOPERITONEAL SHUNT  2007   x2   Family History:  Family  History  Problem Relation Age of Onset   Healthy Mother    Lung cancer Father        former smoker   Heart disease Brother        MI age 57   Other Brother        Murdered   Down syndrome Son    Diabetes Neg Hx    Prostate cancer Neg Hx    Colon cancer Neg Hx    Stomach cancer Neg Hx    Pancreatic cancer Neg Hx    Liver disease Neg Hx     Hospital Course:  Patient is a 68 year old male that presents this date voluntary to [facility] being brought in by his ex-wife after patient made statements to self-harm earlier this date. Patient denies any active plan or intent at the time of the assessment although stated earlier this date he had thoughts about overdosing on his medical medications or, do something else. Patient is admitted to [facility] unit with Q15 min safety monitoring. Multidisciplinary team approach is offered. Medication management; group/milieu therapy is offered.  Detailed risk assessment is complete based on clinical exam and individual risk factors and acute suicide risk is low and acute violence risk is low.    On admission patient home medications were restarted Cymbalta  120 mg daily.  Patient was noted to be going through some impulsive behaviors, personality changes where he is involved in high risk behaviors of spending away the money, giving his account numbers to strangers, putting himself in a situation of identity theft where he even separated from his wife and children.  Given the hypomanic symptoms patient was started on Depakote  ER 500 mg.  Patient has tolerated the medications with no reported side effects.  His Klonopin  was reduced to 0.25 mg due to his low blood pressure and cognitive impairment.  Patient and family was educated and recommended to follow-up with neurology outpatient for neurocognitive assessment.  Family was also encouraged to have a PEG appointed for patient to manage his finances.  Patient maintain safe behaviors on the unit and possibly during  groups.  On the day of discharge patient denied consistently SI/HI/plan and denied hallucinations.  He remains pleasant.  He is willing to participate in outpatient mental health services.   Currently, all modifiable risk of harm to self/harm to  others have been addressed and patient is no longer appropriate for the acute inpatient setting and is able to continue treatment for mental health needs in the community with the supports as indicated below.  Patient is educated and verbalized understanding of discharge plan of care including medications, follow-up appointments, mental health resources and further crisis services in the community.  He is instructed to call 911 or present to the nearest emergency room should he experience any decompensation in mood, disturbance of bowel or return of suicidal/homicidal ideations.  Patient verbalizes understanding of this education and agrees to this plan of care  Physical Findings: AIMS:  , ,  ,  ,    CIWA:    COWS:        Psychiatric Specialty Exam:  Presentation  General Appearance:  Appropriate for Environment; Casual  Eye Contact: Fair  Speech: Clear and Coherent  Speech Volume: Normal    Mood and Affect  Mood: Euthymic  Affect: Appropriate   Thought Process  Thought Processes: Coherent  Descriptions of Associations:Intact  Orientation:Full (Time, Place and Person)  Thought Content:Logical  Hallucinations:Hallucinations: None  Ideas of Reference:None  Suicidal Thoughts:Suicidal Thoughts: No  Homicidal Thoughts:Homicidal Thoughts: No   Sensorium  Memory: Immediate Fair; Recent Fair  Judgment: Fair  Insight: Fair   Art therapist  Concentration: Fair  Attention Span: Fair  Recall: [employer] of Knowledge: Fair  Language: Fair   Psychomotor Activity  Psychomotor Activity: Psychomotor Activity: Normal  Musculoskeletal: Strength & Muscle Tone: within normal limits Gait & Station:  normal Assets  Assets: Manufacturing systems engineer; Desire for Improvement; Resilience; Social Support   Sleep  Sleep: Sleep: Fair    Physical Exam: Physical Exam Vitals and nursing note reviewed.    ROS Blood pressure 119/77, pulse 72, temperature 97.9 F (36.6 C), resp. rate 17, height 5' 11 (1.803 m), weight 104.3 kg, SpO2 96%. Body mass index is 32.08 kg/m.   Social History   Tobacco Use  Smoking Status Never  Smokeless Tobacco Never   Tobacco Cessation:  N/A, patient does not currently use tobacco products   Blood Alcohol level:  Lab Results  Component Value Date   [facility] <15 01/16/2024    Metabolic Disorder Labs:  Lab Results  Component Value Date   HGBA1C 9.1 (A) 10/19/2023   MPG 139.85 08/15/2021   MPG 148.46 09/27/2020   Lab Results  Component Value Date   PROLACTIN 8.7 10/10/2014   Lab Results  Component Value Date   CHOL 132 01/20/2023   TRIG 145 08/20/2023   HDL 41.00 01/20/2023   CHOLHDL 3 01/20/2023   VLDL 48.0 (H) 01/20/2023   LDLCALC 68 01/30/2022   LDLCALC 55 08/16/2021    See Psychiatric Specialty Exam and Suicide Risk Assessment completed by Attending Physician prior to discharge.  Discharge destination:  Home  Is patient on multiple antipsychotic therapies at discharge:  No   Has Patient had three or more failed trials of antipsychotic monotherapy by history:  No  Recommended Plan for Multiple Antipsychotic Therapies: NA  Discharge Instructions     Amb Referral to Nutrition and Diabetic Education   Complete by: As directed    Amb ref to Medical Nutrition Therapy-MNT   Complete by: As directed    Diet - low sodium heart healthy   Complete by: As directed    Increase activity slowly   Complete by: As directed       Allergies as of 01/25/2024       Reactions  Bee Venom Anaphylaxis   Hydrocodone  Bit-homatrop Mbr Other (See Comments)   Hallucinations, confusion, delirium Depressed feeling   Toradol  [ketorolac   Tromethamine ] Other (See Comments)   Hallucinations, confusion, delirium   Prednisone    Patient reports it causes cirrhosis to flare up   Sulfadiazine    NDC Rniz:[id number] NDC Rniz:[id number] NDC Rniz:[id number]   Morphine And Codeine Other (See Comments)   Hallucinations, back in the 80s. States has taken vicodin before w/o problems    Sulfa Drugs Cross Reactors Rash        Medication List     STOP taking these medications    clonazePAM  0.5 MG tablet Commonly known as: KLONOPIN  Replaced by: clonazePAM  0.25 MG disintegrating tablet   ondansetron  4 MG disintegrating tablet Commonly known as: ZOFRAN -ODT       TAKE these medications      Indication  acetaminophen  325 MG tablet Commonly known as: TYLENOL  Take 2 tablets (650 mg total) by mouth every 4 (four) hours as needed for headache or mild pain.    amLODipine  5 MG tablet Commonly known as: NORVASC  Take 1 tablet (5 mg total) by mouth daily.    aspirin  81 MG chewable tablet Chew 1 tablet (81 mg total) by mouth daily.    atorvastatin  80 MG tablet Commonly known as: LIPITOR  Take 1 tablet (80 mg total) by mouth at bedtime.    Botox  200 units injection Generic drug: botulinum toxin Type A  Provider to inject 155 units into the muscles of the head and neck every 3 months. Discard remainder. What changed:  how much to take how to take this when to take this    carvedilol  12.5 MG tablet Commonly known as: COREG  Take 1 tablet (12.5 mg total) by mouth 2 (two) times daily with a meal.    clonazePAM  0.25 MG disintegrating tablet Commonly known as: KLONOPIN  Take 1 tablet (0.25 mg total) by mouth at bedtime. Replaces: clonazePAM  0.5 MG tablet    Dexcom G6 Sensor Misc Use to monitor blood sugar, change after 10 days What changed:  how much to take how to take this when to take this    Dexcom G6 Transmitter Misc Use as directed for 90 days    dicyclomine  20 MG tablet Commonly known as: BENTYL  Take 1  tablet (20 mg total) by mouth 2 (two) times daily as needed. What changed: reasons to take this    divalproex  500 MG 24 hr tablet Commonly known as: DEPAKOTE  ER Take 1 tablet (500 mg total) by mouth at bedtime.    DULoxetine  60 MG capsule Commonly known as: CYMBALTA  Take 2 capsules (120 mg total) by mouth daily.    empagliflozin  25 MG Tabs tablet Commonly known as: Jardiance  Take 1 tablet (25 mg total) by mouth daily with breakfast.  Indication: Type 2 Diabetes   EPINEPHrine  0.3 mg/0.3 mL Soaj injection Commonly known as: EpiPen  2-Pak Inject 0.3 mLs (0.3 mg total) into the muscle once as needed for up to 1 dose for anaphylaxis.    gabapentin  600 MG tablet Commonly known as: NEURONTIN  Take 1 tablet (600 mg total) by mouth 2 (two) times daily.    insulin  lispro 100 UNIT/ML injection Commonly known as: HumaLOG  Use up to 60 Units daily in pump    isosorbide  mononitrate 120 MG 24 hr tablet Commonly known as: IMDUR  Take 1 tablet (120 mg total) by mouth daily.    melatonin 5 MG Tabs Take 1 tablet (5 mg total) by mouth at bedtime.  metFORMIN  500 MG tablet Commonly known as: GLUCOPHAGE  Take 1 tablet (500 mg total) by mouth 2 (two) times daily.    mupirocin  ointment 2 % Commonly known as: BACTROBAN  Apply 1 Application topically 2 (two) times daily.    nitroGLYCERIN  0.4 MG SL tablet Commonly known as: NITROSTAT  Place 1 tablet (0.4 mg total) under the tongue every 5 (five) minutes for 3 doses as needed for chest pain.    Omnipod 5 DexG7G6 Pods Gen 5 Misc Use as directed every 3 (three) days.    pantoprazole  40 MG tablet Commonly known as: PROTONIX  Take 1 tablet (40 mg total) by mouth daily.    Skyrizi Pen 150 MG/ML pen Generic drug: risankizumab-rzaa As directed    UltiCare Insulin  Syringe 31G X 5/16 1 ML Misc Generic drug: Insulin  Syringe-Needle U-100 Use to administer Humalog  3 times a day What changed:  how much to take how to take this when to take this           Follow-up recommendations:  Activity:  As tolerated    Signed: Noma Quijas, MD 01/25/2024, 9:08 AM

## 2024-01-25 NOTE — Progress Notes (Signed)
 Discharge Note:  Patient denies SI/HI/AVH at this time. Discharge instructions, AVS, prescriptions, and transition record reviewed with patient. Patient agrees to comply with medication management, follow-up visit, and outpatient therapy.  Only belongings patient had were in his room.  Patient questions and concerns addressed and answered. Patient ambulatory off unit. Patient discharged home with his wife.   Patient completed Suicide Safety Plan. Copy of plan placed in discharge folder.

## 2024-01-25 NOTE — Plan of Care (Signed)
   Problem: Education: Goal: Emotional status will improve Outcome: Progressing Goal: Mental status will improve Outcome: Progressing Goal: Verbalization of understanding the information provided will improve Outcome: Progressing   Problem: Activity: Goal: Interest or engagement in activities will improve Outcome: Progressing

## 2024-01-25 NOTE — Progress Notes (Signed)
 Patient is pleasant and cooperative.  Denies SI/HI and AVH.  Denies anxiety and depression.  Pain rated 4/10 (headache).     Compliant with scheduled medications. PRN medication given for pain.  Patient is present in the milieu.  Appropriate interaction with staff and peers.

## 2024-01-25 NOTE — Care Management Important Message (Signed)
 Important Message  Patient Details  Name: James [name] MRN: [id number] Date of Birth: 14-Jul-1955   Important Message Given:  Yes - Medicare IM     [name], LCSWA 01/25/2024, 11:13 AM

## 2024-01-25 NOTE — Progress Notes (Signed)
  [facility] Adult Case Management Discharge Plan :  Will you be returning to the same living situation after discharge:  Yes,  pt will return home  At discharge, do you have transportation home?: Yes,  pt's wife will pick him up  Do you have the ability to pay for your medications: Yes,  [employer] MEDICARE / [facility] MEDICARE  Release of information consent forms completed and in the chart;  Patient's signature needed at discharge.  Patient to Follow up at:  Follow-up Information     [facility] Follow up on 02/01/2024.   Why: Your appointment is scheduled for 8/5 at 12:30 via the phone and will receive a call at [phone]. Contact information: [address]  [address] [place] [address] [phone]                 Next level of care provider has access to [facility] Link:no  Safety Planning and Suicide Prevention discussed: Yes,   Jernard Reiber, wife , [phone]     Has patient been referred to the Quitline?: Patient does not use tobacco/nicotine products  Patient has been referred for addiction treatment: No known substance use disorder.  [address] LCSWA 01/25/2024, 11:11 AM

## 2024-01-26 ENCOUNTER — Other Ambulatory Visit (HOSPITAL_BASED_OUTPATIENT_CLINIC_OR_DEPARTMENT_OTHER): Payer: Self-pay

## 2024-01-27 ENCOUNTER — Other Ambulatory Visit: Payer: Self-pay

## 2024-01-27 ENCOUNTER — Other Ambulatory Visit (HOSPITAL_BASED_OUTPATIENT_CLINIC_OR_DEPARTMENT_OTHER): Payer: Self-pay

## 2024-01-31 NOTE — Telephone Encounter (Signed)
 LMOM asking for call back, able to be seen today at 1:20 for hosp f/u. Asked that he call back to let us  know.

## 2024-02-01 ENCOUNTER — Encounter: Payer: Self-pay | Admitting: Neurology

## 2024-02-01 ENCOUNTER — Other Ambulatory Visit (HOSPITAL_BASED_OUTPATIENT_CLINIC_OR_DEPARTMENT_OTHER): Payer: Self-pay

## 2024-02-01 ENCOUNTER — Ambulatory Visit (INDEPENDENT_AMBULATORY_CARE_PROVIDER_SITE_OTHER): Admitting: Neurology

## 2024-02-01 ENCOUNTER — Ambulatory Visit: Payer: PPO | Admitting: Internal Medicine

## 2024-02-01 VITALS — BP 107/72 | HR 74 | Ht 71.0 in | Wt 215.0 lb

## 2024-02-01 DIAGNOSIS — G43709 Chronic migraine without aura, not intractable, without status migrainosus: Secondary | ICD-10-CM

## 2024-02-01 DIAGNOSIS — G4752 REM sleep behavior disorder: Secondary | ICD-10-CM | POA: Diagnosis not present

## 2024-02-01 DIAGNOSIS — Z982 Presence of cerebrospinal fluid drainage device: Secondary | ICD-10-CM | POA: Diagnosis not present

## 2024-02-01 DIAGNOSIS — G4733 Obstructive sleep apnea (adult) (pediatric): Secondary | ICD-10-CM

## 2024-02-01 DIAGNOSIS — R45851 Suicidal ideations: Secondary | ICD-10-CM

## 2024-02-01 DIAGNOSIS — G911 Obstructive hydrocephalus: Secondary | ICD-10-CM

## 2024-02-01 DIAGNOSIS — F3341 Major depressive disorder, recurrent, in partial remission: Secondary | ICD-10-CM

## 2024-02-01 MED ORDER — NURTEC 75 MG PO TBDP
75.0000 mg | ORAL_TABLET | ORAL | 11 refills | Status: AC
  Filled 2024-02-01: qty 15, 30d supply, fill #0
  Filled 2024-02-26: qty 15, 30d supply, fill #1

## 2024-02-01 NOTE — Progress Notes (Signed)
 [facility]  PATIENT: James [name] DOB: 10-14-1955  REFERRING DOCTOR OR PCP:  Aloysius Mech  SOURCE: patient, notes from Dr. Mech, imaging results and MRI and CT scans on PACS personally reviewed  _________________________________   HISTORICAL  CHIEF COMPLAINT:  Chief Complaint  Patient presents with   RM10/MIGRAINES    Pt is here Alone. Pt states that he is having headaches again. Pt states that he was in behavioral health for a week and wasn't able to take his medication. Pt is here to discuss treatment options. Pt states that he gets a headache everyday. Pt states that he has a headache today.     HISTORY OF PRESENT ILLNESS:  James [name] is a 68 y.o. man with idiopathic intracranial hypertension and recent increase in the frequency and severity of headache  Update 02/01/2024:  He reports having 4-5 days with very little sleep and began to have suicidal thoughts.  He had no plan.   He let his wife knw and he was admitted to [facility].   Of note he had been off the clonazepam  for a week while this was going on.    He is back on clonazepam  at night.  I had him on this for active dreams (RBD) as well as the insomnia.    He is sleeping better than when he was off the medication but still not sleeping solidly - wakes up multiple times.  HA were doing better on Botox  but due to insurance/copay issues (has a Medicare Advantage plan) he has not been able to reschedule He reports that headaches are doing very well after the last Botox  injections until last week when they return and are now daily over the past few days.  He is back to having a headache every day with many of them being more severe in intensity..  Neck pain is also doing better.    Neck pain has done well the whole 3 months.  When a a migraine occurs it is bilateral most of the time and occipital and frontal located.   The headaches are usually bilaterally located, occiput and vertex.  When  more severe he has nausea and photophobia.  Moving usually worsens the headaches.  Laying down sometimes improves them  He did not get a benefit from multiple prophylactic medications including Keppra , gabapentin , Emgality , Cymbalta .  He took an [place] once with benefit.  He has obstructive sleep apnea but was unable to tolerate CPAP.  We had discussed the inspire device but he is not interested at this time..  We have discussed weight loss might be of benefit since unable to tolerate CPAP.     The REM behavior disorder is doing well on nighttime clonazepam . He has had no active dreams since starting it .   No other signs of a synucleinopathy.   It also helps his insomnia  He has a VP shunt for idiopathic intracranial hypertension, since 2017.  Other medical issues:   In early 2022, he had 3 cardiac stents after being diagnosed with CAD after presenting with chest pain.   He had a small MI.    He is also on Humira  for psoriatis.  He has MASH related cirrhosis.   Chronic Migraine History: He was having daily HA with migrainous features.   He did not get a benefit from multiple prophylactic medications including Keppra , gabapentin , Emgality , Cymbalta .   Botox  therapy started July 2021.  Spine: L-spine MRI 08/15/18 showed progressive epidural lipomatosis in  the lower lumbar spine with progressive compression of the thecal sac and spinal stenosis at L3-4, L4-5, and L5-S1 compared to the prior study. Progressive facet degeneration and anterolisthesis at L4-5 since 2011.    MRI of the cervical spine performed 04/27/2017 showed  thoracic fusion hardware from C7 and below. There is mild spondylosis and disc bulging at C3-C4 through C6-C7. There does not appear to be any significant foraminal narrowing and there is no spinal stenosis.   Idiopthic intracranial Hypertension: He was diagnosed with idiopathic intracranial hypertension many years ago and had a VP shunt placed in 2007. A revision was  performed July 2007 due to infection. He has a programmable Medtronic valve. Due to the increased headaches, it was reprogrammed from 1.5-1.0. Tapping of the shunt showed a pressure 160 (was drained to 140 mm). There was no infection. He also had an MRI of the brain and MR venogram. CT had shown the left-sided ventricular catheter extends into the right caudate head, similar to the previous study the MR venogram (11/12/2016) was reportedly normal. The left transverse and sigmoid sinuses are not well evaluated due to the shunt reservoir (but also likely he is right dominant).   CT head 3/13 2020 personally reviewed and no evidence of shunt failure        REVIEW OF SYSTEMS: Constitutional: No fevers, chills, sweats, or change in appetite Eyes: No visual changes, double vision, eye pain Ear, nose and throat: No hearing loss, ear pain, nasal congestion, sore throat Cardiovascular: No chest pain, palpitations Respiratory:  No shortness of breath at rest or with exertion.   No wheezes GastrointestinaI: No nausea, vomiting, diarrhea, abdominal pain, fecal incontinence Genitourinary:  No dysuria, urinary retention or frequency.  No nocturia. Musculoskeletal:  as above Integumentary: psoriasis on Humira  Neurological: as above Psychiatric: No depression at this time.  No anxiety Endocrine: No palpitations, diaphoresis, change in appetite, change in weigh or increased thirst Hematologic/Lymphatic:  No anemia, purpura, petechiae. Allergic/Immunologic: No itchy/runny eyes, nasal congestion, recent allergic reactions, rashes  ALLERGIES: Allergies  Allergen Reactions   Bee Venom Anaphylaxis   Hydrocodone  Bit-Homatrop Mbr Other (See Comments)    Hallucinations, confusion, delirium Depressed feeling   Toradol  [Ketorolac  Tromethamine ] Other (See Comments)    Hallucinations, confusion, delirium   Prednisone     Patient reports it causes cirrhosis to flare up   Sulfadiazine     NDC Rniz:[id number] NDC  Rniz:[id number] NDC Rniz:[id number]   Morphine And Codeine Other (See Comments)    Hallucinations, back in the 80s. States has taken vicodin before w/o problems    Sulfa Drugs Cross Reactors Rash    HOME MEDICATIONS:  Current Outpatient Medications:    acetaminophen  (TYLENOL ) 325 MG tablet, Take 2 tablets (650 mg total) by mouth every 4 (four) hours as needed for headache or mild pain., Disp: , Rfl:    amLODipine  (NORVASC ) 5 MG tablet, Take 1 tablet (5 mg total) by mouth daily., Disp: 90 tablet, Rfl: 2   aspirin  81 MG chewable tablet, Chew 1 tablet (81 mg total) by mouth daily., Disp: , Rfl:    atorvastatin  (LIPITOR ) 80 MG tablet, Take 1 tablet (80 mg total) by mouth at bedtime., Disp: 90 tablet, Rfl: 1   carvedilol  (COREG ) 12.5 MG tablet, Take 1 tablet (12.5 mg total) by mouth 2 (two) times daily with a meal., Disp: 180 tablet, Rfl: 1   clonazePAM  (KLONOPIN ) 0.25 MG disintegrating tablet, Take 1 tablet (0.25 mg total) by mouth at bedtime., Disp: 60 tablet, Rfl: 0  Continuous Glucose Sensor (DEXCOM G6 SENSOR) MISC, Use to monitor blood sugar, change after 10 days (Patient taking differently: 1 each by Other route See admin instructions. Use to monitor blood sugar, change after 10 days), Disp: 9 each, Rfl: 3   Continuous Glucose Transmitter (DEXCOM G6 TRANSMITTER) MISC, Use as directed for 90 days, Disp: 1 each, Rfl: 3   dicyclomine  (BENTYL ) 20 MG tablet, Take 1 tablet (20 mg total) by mouth 2 (two) times daily as needed. (Patient taking differently: Take 20 mg by mouth 2 (two) times daily as needed for spasms.), Disp: 10 tablet, Rfl: 0   divalproex  (DEPAKOTE  ER) 500 MG 24 hr tablet, Take 1 tablet (500 mg total) by mouth at bedtime., Disp: 30 tablet, Rfl: 0   DULoxetine  (CYMBALTA ) 60 MG capsule, Take 2 capsules (120 mg total) by mouth daily., Disp: 60 capsule, Rfl: 0   empagliflozin  (JARDIANCE ) 25 MG TABS tablet, Take 1 tablet (25 mg total) by mouth daily with breakfast., Disp: 90 tablet, Rfl:  3   EPINEPHrine  (EPIPEN  2-PAK) 0.3 mg/0.3 mL IJ SOAJ injection, Inject 0.3 mLs (0.3 mg total) into the muscle once as needed for up to 1 dose for anaphylaxis., Disp: 2 each, Rfl: 2   gabapentin  (NEURONTIN ) 600 MG tablet, Take 1 tablet (600 mg total) by mouth 2 (two) times daily., Disp: 180 tablet, Rfl: 1   Insulin  Disposable Pump (OMNIPOD 5 G6 PODS, GEN 5,) MISC, Use as directed every 3 (three) days., Disp: 10 each, Rfl: 3   insulin  lispro (HUMALOG ) 100 UNIT/ML injection, Use up to 60 Units daily in pump, Disp: 20 mL, Rfl: 2   Insulin  Syringe-Needle U-100 (INSULIN  SYRINGE 1CC/31GX5/16) 31G X 5/16 1 ML MISC, Use to administer Humalog  3 times a day (Patient taking differently: 1 tablet by Other route See admin instructions. Use to administer Humalog  3 times a day), Disp: 100 each, Rfl: 0   isosorbide  mononitrate (IMDUR ) 120 MG 24 hr tablet, Take 1 tablet (120 mg total) by mouth daily., Disp: 90 tablet, Rfl: 2   melatonin 5 MG TABS, Take 1 tablet (5 mg total) by mouth at bedtime., Disp: 90 tablet, Rfl: 0   metFORMIN  (GLUCOPHAGE ) 500 MG tablet, Take 1 tablet (500 mg total) by mouth 2 (two) times daily., Disp: 180 tablet, Rfl: 3   mupirocin  ointment (BACTROBAN ) 2 %, Apply 1 Application topically 2 (two) times daily., Disp: 22 g, Rfl: 0   nitroGLYCERIN  (NITROSTAT ) 0.4 MG SL tablet, Place 1 tablet (0.4 mg total) under the tongue every 5 (five) minutes for 3 doses as needed for chest pain., Disp: 25 tablet, Rfl: 6   pantoprazole  (PROTONIX ) 40 MG tablet, Take 1 tablet (40 mg total) by mouth daily., Disp: 90 tablet, Rfl: 1   SKYRIZI PEN 150 MG/ML pen, As directed, Disp: , Rfl:    botulinum toxin Type A  (BOTOX ) 200 units injection, Provider to inject 155 units into the muscles of the head and neck every 3 months. Discard remainder. (Patient not taking: Reported on 02/01/2024), Disp: 1 each, Rfl: 2  Current Facility-Administered Medications:    botulinum toxin Type A  (BOTOX ) injection 155 Units, 155 Units,  Intramuscular, Once, Ashlen Kiger, Charlie LABOR, MD  PAST MEDICAL HISTORY: Past Medical History:  Diagnosis Date   Abnormal cardiac CT angiography    Acid reflux    Annual physical exam 04/08/2015   Arthritis    Atypical chest pain 06/13/2020   Blood transfusion without reported diagnosis    Body mass index (BMI) 35.0-35.9, adult 04/05/2019  Chronic fatigue 01/28/2015   Chronic headaches    on cymbalta    Chronic kidney disease, stage 3b (HCC) 07/14/2023   Chronic migraine w/o aura, not intractable, w/o stat migr 10/24/2018   Cirrhosis (HCC)    Colon polyps    Complication of anesthesia    problems waking up from anesthesia   Coronary artery disease 01/23/2019   Depression    on cymbalta    Diabetes mellitus with neuropathy (HCC)    Diabetes with neuropathy 04/25/2013   Diverticulitis 03/2013   Dyslipidemia 05/29/2019   Eczema    Elevated LFTs    Epidural lipomatosis 10/05/2018   Essential (primary) hypertension 04/05/2019   Essential hypertension 10/10/2019   Fatty liver    GERD (gastroesophageal reflux disease) 04/28/2011   H/O craniotomy 05/07/2015   Headache 04/28/2011   Hepatitis 10/2017   NASH cirrhosis   History of kidney stones    Hyperlipidemia    Hypersomnia with sleep apnea 01/28/2015   Hypertension    IDA (iron  deficiency anemia) 01/24/2019   Idiopathic intracranial hypertension 01/14/2017   Insomnia 04/26/2013   Kidney stone    Liver cirrhosis secondary to NASH (nonalcoholic steatohepatitis) (HCC) 01/02/2016   Low back pain 04/05/2019   Lower back injury 08/14/2019   Morbid obesity (HCC)    Neuromuscular disorder (HCC)    neuropathy   Neuropathy    Nonalcoholic steatohepatitis 10/05/2018   Obstructive hydrocephalus (HCC) 01/28/2015   OSA -- dx ~ 2012, cpap intolerant 09/04/2014    dx ~ 2012, cpap intolerant    PCP NOTES >>> 04/08/2015   Post-op pain 03/19/2019   Post-traumatic hydrocephalus (HCC)    s/p shunts x 2 (first got infected )   Presence of  cerebrospinal fluid drainage device 07/28/2011   Psoriasis    sees Dr Leanord   Psoriatic arthritis (HCC)    REM behavioral disorder 01/14/2017   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   see's Dr.Ortmann   Severe obesity (BMI >= 40) (HCC) 01/28/2015   SI (sacroiliac) joint dysfunction 08/14/2019   Sigmoid diverticulitis 04/25/2013   Sleep apnea    no CPAP      Spondylolisthesis, lumbar region 03/16/2019   Stomach ulcer    Testosterone  deficiency 04/28/2011   VP (ventriculoperitoneal) shunt status 07/31/2020    PAST SURGICAL HISTORY: Past Surgical History:  Procedure Laterality Date   AMPUTATION Left 12/27/2021   Procedure: AMPUTATION GREAT TOE;  Surgeon: Gershon Donnice SAUNDERS, DPM;  Location: [facility] OR;  Service: Podiatry;  Laterality: Left;   BACK SURGERY  1980   BRAIN SURGERY     VP shunts placed in 2007   CHOLECYSTECTOMY N/A 08/25/2017   Procedure: LAPAROSCOPIC CHOLECYSTECTOMY WITH INTRAOPERATIVE CHOLANGIOGRAM;  Surgeon: Curvin Deward MOULD, MD;  Location[facility] OR;  Service: General;  Laterality: N/A;   COLONOSCOPY     CORONARY STENT INTERVENTION N/A 09/27/2020   Procedure: CORONARY STENT INTERVENTION;  Surgeon: Dann Candyce RAMAN, MD;  Location: [facility] INVASIVE CV LAB;  Service: Cardiovascular;  Laterality: N/A;   CORONARY ULTRASOUND/IVUS N/A 09/27/2020   Procedure: Intravascular Ultrasound/IVUS;  Surgeon: Dann Candyce RAMAN, MD;  Location: [facility] INVASIVE CV LAB;  Service: Cardiovascular;  Laterality: N/A;   JOINT REPLACEMENT     total hip   LEFT HEART CATH N/A 09/27/2020   Procedure: Left Heart Cath;  Surgeon: Dann Candyce RAMAN, MD;  Location: [facility] INVASIVE CV LAB;  Service: Cardiovascular;  Laterality: N/A;   LEFT HEART CATH AND CORONARY ANGIOGRAPHY N/A 09/24/2020   Procedure: LEFT HEART CATH AND  CORONARY ANGIOGRAPHY;  Surgeon: Dann Candyce RAMAN, MD;  Location: [facility] INVASIVE CV LAB;  Service: Cardiovascular;  Laterality: N/A;   LEFT HEART CATH AND CORONARY ANGIOGRAPHY N/A 08/18/2021    Procedure: LEFT HEART CATH AND CORONARY ANGIOGRAPHY;  Surgeon: Burnard Debby LABOR, MD;  Location: [facility] INVASIVE CV LAB;  Service: Cardiovascular;  Laterality: N/A;   LUMBAR FUSION  03/16/2019   SHOULDER SURGERY Left 2010   TEE WITHOUT CARDIOVERSION N/A 01/01/2022   Procedure: TRANSESOPHAGEAL ECHOCARDIOGRAM (TEE);  Surgeon: Sheena Pugh, DO;  Location: [facility] ENDOSCOPY;  Service: Cardiovascular;  Laterality: N/A;   TOE SURGERY Left 2018   TONSILLECTOMY     as a child   TOTAL HIP ARTHROPLASTY Left 2011   UPPER GASTROINTESTINAL ENDOSCOPY  01/04/2020   VENTRICULOPERITONEAL SHUNT  2007   x2    FAMILY HISTORY: Family History  Problem Relation Age of Onset   Healthy Mother    Lung cancer Father        former smoker   Heart disease Brother        MI age 65   Other Brother        Murdered   Down syndrome Son    Diabetes Neg Hx    Prostate cancer Neg Hx    Colon cancer Neg Hx    Stomach cancer Neg Hx    Pancreatic cancer Neg Hx    Liver disease Neg Hx     SOCIAL HISTORY:  Social History   Socioeconomic History   Marital status: Married    Spouse name: [name]   Number of children: 2   Years of education: Not on file   Highest education level: 12th grade  Occupational History   Occupation: disabled   Tobacco Use   Smoking status: Never   Smokeless tobacco: Never  Vaping Use   Vaping status: Never Used  Substance and Sexual Activity   Alcohol use: No   Drug use: No   Sexual activity: Yes    Partners: Female  Other Topics Concern   Not on file  Social History Narrative   Household-- pt , wife, one adult son with Down's syndrome   younger son lives in [place]   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   Social Drivers of Corporate investment banker Strain: Low Risk  (02/10/2023)   Overall Financial Resource Strain (CARDIA)    Difficulty of Paying Living Expenses: Not hard at all  Food Insecurity: No Food Insecurity (01/17/2024)   Hunger  Vital Sign    Worried About Running Out of Food in the Last Year: Never true    Ran Out of Food in the Last Year: Never true  Transportation Needs: No Transportation Needs (01/17/2024)   PRAPARE - Administrator, Civil Service (Medical): No    Lack of Transportation (Non-Medical): No  Physical Activity: Unknown (02/10/2023)   Exercise Vital Sign    Days of Exercise per Week: 0 days    Minutes of Exercise per Session: Not on file  Stress: No Stress Concern Present (02/10/2023)   [employer] of Occupational Health - Occupational Stress Questionnaire    Feeling of Stress : Only a little  Social Connections: Moderately Integrated (01/17/2024)   Social Connection and Isolation Panel    Frequency of Communication with Friends and Family: More than three times a week    Frequency of Social Gatherings with Friends and Family: Twice a week    Attends Religious Services: 1  to 4 times per year    Active Member of Clubs or Organizations: Yes    Attends Banker Meetings: 1 to 4 times per year    Marital Status: Divorced  Intimate Partner Violence: Not At Risk (01/17/2024)   Humiliation, Afraid, Rape, and Kick questionnaire    Fear of Current or Ex-Partner: No    Emotionally Abused: No    Physically Abused: No    Sexually Abused: No     PHYSICAL EXAM Today's Vitals   02/01/24 0931  BP: 107/72  Pulse: 74  SpO2: 98%  Weight: 215 lb (97.5 kg)  Height: 5' 11 (1.803 m)     Body mass index is 29.99 kg/m.     General: The patient is well-developed and well-nourished and in no acute distress, he has a VP shunt bulb on the left.      Neck: He has mild bilateral occipital tenderness.  Good range of motion..  No weakness.   Skin: Extremities are without significant edema.  Psoriasis is well controlled.  Neurologic Exam  Mental status: Affect was appropriate.  The patient is alert and oriented x 3 at the time of the examination.  No ptosis.  The patient has  apparent normal recent and remote memory, with an apparently normal attention span and concentration ability.   Speech is normal.  Cranial nerves: Extraocular movements are full.  Facial strength and sensation is normal.  Neck strength is normal.  Hearing is normal.  Motor: 5/5 strength  Coordination: Cerebellar testing shows good finger-nose-finger  Gait and station: Station is normal.  The gait is arthritic.  Tandem gait is wide.  Romberg is negative.     DIAGNOSTIC DATA (LABS, IMAGING, TESTING) - I reviewed patient records, labs, notes, testing and imaging myself where available.  Lab Results  Component Value Date   WBC 6.4 01/16/2024   HGB 14.9 01/16/2024   HCT 44.3 01/16/2024   MCV 83.4 01/16/2024   PLT 188 01/16/2024      Component Value Date/Time   NA 139 01/16/2024 1554   NA 143 07/06/2022 0000   K 4.1 01/16/2024 1554   CL 102 01/16/2024 1554   CO2 21 (L) 01/16/2024 1554   GLUCOSE 183 (H) 01/16/2024 1554   BUN 11 01/16/2024 1554   BUN 21 07/06/2022 0000   CREATININE 1.19 01/16/2024 1554   CALCIUM  9.4 01/16/2024 1554   PROT 7.9 01/16/2024 1554   ALBUMIN  4.3 01/16/2024 1554   AST 22 01/16/2024 1554   ALT 25 01/16/2024 1554   ALKPHOS 124 01/16/2024 1554   BILITOT 2.2 (H) 01/16/2024 1554   GFRNONAA >60 01/16/2024 1554   GFRAA 66 08/08/2020 1442   Lab Results  Component Value Date   CHOL 132 01/20/2023   HDL 41.00 01/20/2023   LDLCALC 68 01/30/2022   LDLDIRECT 59.0 01/20/2023   TRIG 145 08/20/2023   CHOLHDL 3 01/20/2023   Lab Results  Component Value Date   HGBA1C 9.1 (A) 10/19/2023   Lab Results  Component Value Date   VITAMINB12 490 06/10/2018   Lab Results  Component Value Date   TSH 1.693 08/19/2023       ASSESSMENT AND PLAN  Chronic migraine w/o aura, not intractable, w/o stat migr  REM behavioral disorder  OSA -- dx ~ 2012, cpap intolerant  Obstructive hydrocephalus (HCC)  Presence of cerebrospinal fluid drainage  device  Suicidal ideation  1.   We will hold on the Botox .  Trial of anti-CGRP agent.  Nurtec ODT  samples one po every other day ([id number]    04/2027) --- 8 pills total.   If no better consider one of the anti-CGRP injections or restart Botox  2.    Continue low dose clonazepam  nightly for REM behavior disorder and insomnia. 3.  He was unable to tolerate CPAP and does not wish to retry.   4.   He will f/u with psychiatry.   Continue duloxetine  for mood. 5.   rtc 6 months, sooner if new or worsening issues.  This visit is part of a comprehensive longitudinal care medical relationship regarding the patients primary diagnosis of chronic migraine and related concerns.    Zosia Lucchese A. Vear, MD, [facility] 02/01/2024, 10:07 AM Certified in Neurology, Clinical Neurophysiology, Sleep Medicine, Pain Medicine and Neuroimaging  [facility] [address] [place], [place] [address] [phone]

## 2024-02-08 ENCOUNTER — Ambulatory Visit: Admitting: Endocrinology

## 2024-02-08 ENCOUNTER — Ambulatory Visit: Payer: Self-pay | Admitting: Endocrinology

## 2024-02-08 ENCOUNTER — Encounter: Payer: Self-pay | Admitting: Endocrinology

## 2024-02-08 VITALS — BP 92/60 | HR 72 | Resp 20 | Ht 71.0 in | Wt 221.2 lb

## 2024-02-08 DIAGNOSIS — E1165 Type 2 diabetes mellitus with hyperglycemia: Secondary | ICD-10-CM | POA: Diagnosis not present

## 2024-02-08 DIAGNOSIS — Z794 Long term (current) use of insulin: Secondary | ICD-10-CM

## 2024-02-08 LAB — POCT GLYCOSYLATED HEMOGLOBIN (HGB A1C): Hemoglobin A1C: 7.2 % — AB (ref 4.0–5.6)

## 2024-02-08 MED ORDER — OMNIPOD 5 DEXG7G6 PODS GEN 5 MISC: 1.0000 | 3 refills | Status: AC

## 2024-02-08 MED ORDER — DEXCOM G6 TRANSMITTER MISC: 1.0000 | 3 refills | Status: DC

## 2024-02-08 MED ORDER — DEXCOM G6 SENSOR MISC: 3 refills | Status: DC

## 2024-02-08 NOTE — Progress Notes (Signed)
 Outpatient Endocrinology Note James Myrikal Messmer, MD  02/08/24  Patient's Name: James [name]    DOB: Nov 14, 1955    MRN: [id number]                                                    REASON OF VISIT: Follow up for type 2 diabetes mellitus  PCP: Amon Aloysius BRAVO, MD  HISTORY OF PRESENT ILLNESS:   James [name] is a 69 y.o. old male with with past medical history listed below, is here for follow up of type 2 diabetes mellitus.   Pertinent Diabetes History: He was diagnosed with type 2 diabetes mellitus around 2014.  He was initially treated with metformin  and glimepiride .  With the worsening of blood sugar control multidose insulin  regimen was started around 2017.  He has been on OmniPod insulin  pump from November 2021.  Chronic Diabetes Complications : Retinopathy: no. Last ophthalmology exam was done on annually, reportedly. Nephropathy: CKD, no microalbuminuria. Peripheral neuropathy: yes, on gabapentin  Coronary artery disease: no Stroke: no  Relevant comorbidities and cardiovascular risk factors: Obesity: yes Body mass index is 30.85 kg/m.  Hypertension: yes Hyperlipidemia. Yes, on a statin. He has been following with gastroenterology for liver cirrhosis secondary to NASH.  Current / Home Diabetic regimen includes: Metformin  500 mg 2 times a day. Jardiance  25 mg daily.  Insulin  Pump setting:  OMNIPOD 5 insulin  pump settings as follows with Dexcom G6. Basal total 10.1 units MN- 0.1u/hour 6AM- 0.3  11AM- 0.5 7PM- 0.8  Bolus CHO Ratio (1unit:CHO) MN- 1:1  Correction/Sensitivity: MN- 1:50  Target:  Active insulin  time:  4 hours  Prior diabetic medications: Amaryl  was stopped June 2024. Januvia   CONTINUOUS GLUCOSE MONITORING SYSTEM (CGMS) / INSULIN  PUMP INTERPRETATION:                         OmniPod 5 Pump & Sensor Download (Reviewed and summarized below.) Pump: Dexcom G6 and OmniPod 5 Dates:    Hypoglycemia: Patient has ? hypoglycemic episodes. Patient  has hypoglycemia awareness.     Factors modifying glucose control: 1.  Diabetic diet assessment: Not eating much due to abdominal pain/diarrhea/acute gastroenteritis.  2.  Staying active or exercising: No formal exercise.  3.  Medication compliance: compliant most of the time.  Interval history Patient reports has not been using OmniPod for about a month due to not having Dexcom transmitter.  He however reports compliance with metformin  and Jardiance .  Patient reports he had recent hospitalization with risk of suicidal ideation.  Patient reports he does not have any plan or no thoughts of harming himself at this time.  Unable to review notes on the chart.  Hemoglobin A1c improved to 7.2%.  No pump or CGM data to download and review today.  REVIEW OF SYSTEMS As per history of present illness.   PAST MEDICAL HISTORY: Past Medical History:  Diagnosis Date   Abnormal cardiac CT angiography    Acid reflux    Annual physical exam 04/08/2015   Arthritis    Atypical chest pain 06/13/2020   Blood transfusion without reported diagnosis    Body mass index (BMI) 35.0-35.9, adult 04/05/2019   Chronic fatigue 01/28/2015   Chronic headaches    on cymbalta    Chronic kidney disease, stage 3b (HCC) 07/14/2023   Chronic  migraine w/o aura, not intractable, w/o stat migr 10/24/2018   Cirrhosis (HCC)    Colon polyps    Complication of anesthesia    problems waking up from anesthesia   Coronary artery disease 01/23/2019   Depression    on cymbalta    Diabetes mellitus with neuropathy (HCC)    Diabetes with neuropathy 04/25/2013   Diverticulitis 03/2013   Dyslipidemia 05/29/2019   Eczema    Elevated LFTs    Epidural lipomatosis 10/05/2018   Essential (primary) hypertension 04/05/2019   Essential hypertension 10/10/2019   Fatty liver    GERD (gastroesophageal reflux disease) 04/28/2011   H/O craniotomy 05/07/2015   Headache 04/28/2011   Hepatitis 10/2017   NASH cirrhosis   History of  kidney stones    Hyperlipidemia    Hypersomnia with sleep apnea 01/28/2015   Hypertension    IDA (iron  deficiency anemia) 01/24/2019   Idiopathic intracranial hypertension 01/14/2017   Insomnia 04/26/2013   Kidney stone    Liver cirrhosis secondary to NASH (nonalcoholic steatohepatitis) (HCC) 01/02/2016   Low back pain 04/05/2019   Lower back injury 08/14/2019   Morbid obesity (HCC)    Neuromuscular disorder (HCC)    neuropathy   Neuropathy    Nonalcoholic steatohepatitis 10/05/2018   Obstructive hydrocephalus (HCC) 01/28/2015   OSA -- dx ~ 2012, cpap intolerant 09/04/2014    dx ~ 2012, cpap intolerant    PCP NOTES >>> 04/08/2015   Post-op pain 03/19/2019   Post-traumatic hydrocephalus (HCC)    s/p shunts x 2 (first got infected )   Presence of cerebrospinal fluid drainage device 07/28/2011   Psoriasis    sees Dr Leanord   Psoriatic arthritis (HCC)    REM behavioral disorder 01/14/2017   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   see's Dr.Ortmann   Severe obesity (BMI >= 40) (HCC) 01/28/2015   SI (sacroiliac) joint dysfunction 08/14/2019   Sigmoid diverticulitis 04/25/2013   Sleep apnea    no CPAP      Spondylolisthesis, lumbar region 03/16/2019   Stomach ulcer    Testosterone  deficiency 04/28/2011   VP (ventriculoperitoneal) shunt status 07/31/2020    PAST SURGICAL HISTORY: Past Surgical History:  Procedure Laterality Date   AMPUTATION Left 12/27/2021   Procedure: AMPUTATION GREAT TOE;  Surgeon: Gershon Donnice SAUNDERS, DPM;  Location: [facility] OR;  Service: Podiatry;  Laterality: Left;   BACK SURGERY  1980   BRAIN SURGERY     VP shunts placed in 2007   CHOLECYSTECTOMY N/A 08/25/2017   Procedure: LAPAROSCOPIC CHOLECYSTECTOMY WITH INTRAOPERATIVE CHOLANGIOGRAM;  Surgeon: Curvin Deward MOULD, MD;  Location[facility] OR;  Service: General;  Laterality: N/A;   COLONOSCOPY     CORONARY STENT INTERVENTION N/A 09/27/2020   Procedure: CORONARY STENT INTERVENTION;  Surgeon: Dann Candyce RAMAN, MD;  Location: [facility] INVASIVE CV LAB;  Service: Cardiovascular;  Laterality: N/A;   CORONARY ULTRASOUND/IVUS N/A 09/27/2020   Procedure: Intravascular Ultrasound/IVUS;  Surgeon: Dann Candyce RAMAN, MD;  Location: [facility] INVASIVE CV LAB;  Service: Cardiovascular;  Laterality: N/A;   JOINT REPLACEMENT     total hip   LEFT HEART CATH N/A 09/27/2020   Procedure: Left Heart Cath;  Surgeon: Dann Candyce RAMAN, MD;  Location: [facility] INVASIVE CV LAB;  Service: Cardiovascular;  Laterality: N/A;   LEFT HEART CATH AND CORONARY ANGIOGRAPHY N/A 09/24/2020   Procedure: LEFT HEART CATH AND CORONARY ANGIOGRAPHY;  Surgeon: Dann Candyce RAMAN, MD;  [facility] INVAS[facility] CV LAB;  Service: Cardiovascular;  Laterality: N/A;   LEFT  HEART CATH AND CORONARY ANGIOGRAPHY N/A 08/18/2021   Procedure: LEFT HEART CATH AND CORONARY ANGIOGRAPHY;  Surgeon: Burnard Debby LABOR, MD;  Location: [facility] INVASIVE CV LAB;  Service: Cardiovascular;  Laterality: N/A;   LUMBAR FUSION  03/16/2019   SHOULDER SURGERY Left 2010   TEE WITHOUT CARDIOVERSION N/A 01/01/2022   Procedure: TRANSESOPHAGEAL ECHOCARDIOGRAM (TEE);  Surgeon: Tobb, Kardie, DO;  Location: [facility] ENDOSCOPY;  Service: Cardiovascular;  Laterality: N/A;   TOE SURGERY Left 2018   TONSILLECTOMY     as a child   TOTAL HIP ARTHROPLASTY Left 2011   UPPER GASTROINTESTINAL ENDOSCOPY  01/04/2020   VENTRICULOPERITONEAL SHUNT  2007   x2    ALLERGIES: Allergies  Allergen Reactions   Bee Venom Anaphylaxis   Hydrocodone  Bit-Homatrop Mbr Other (See Comments)    Hallucinations, confusion, delirium Depressed feeling   Toradol  [Ketorolac  Tromethamine ] Other (See Comments)    Hallucinations, confusion, delirium   Prednisone     Patient reports it causes cirrhosis to flare up   Sulfadiazine     NDC Rniz:[id number] NDC Rniz:[id number] NDC Rniz:[id number]   Morphine And Codeine Other (See Comments)    Hallucinations, back in the 80s. States has taken vicodin before w/o problems    Sulfa  Drugs Cross Reactors Rash    FAMILY HISTORY:  Family History  Problem Relation Age of Onset   Healthy Mother    Lung cancer Father        former smoker   Heart disease Brother        MI age 33   Other Brother        Murdered   Down syndrome Son    Diabetes Neg Hx    Prostate cancer Neg Hx    Colon cancer Neg Hx    Stomach cancer Neg Hx    Pancreatic cancer Neg Hx    Liver disease Neg Hx     SOCIAL HISTORY: Social History   Socioeconomic History   Marital status: Married    Spouse name: [name]   Number of children: 2   Years of education: Not on file   Highest education level: 12th grade  Occupational History   Occupation: disabled   Tobacco Use   Smoking status: Never   Smokeless tobacco: Never  Vaping Use   Vaping status: Never Used  Substance and Sexual Activity   Alcohol use: No   Drug use: No   Sexual activity: Yes    Partners: Female  Other Topics Concern   Not on file  Social History Narrative   Household-- pt , wife, one adult son with Down's syndrome   younger son lives in [place]   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   Social Drivers of Corporate investment banker Strain: Low Risk  (02/10/2023)   Overall Financial Resource Strain (CARDIA)    Difficulty of Paying Living Expenses: Not hard at all  Food Insecurity: No Food Insecurity (01/17/2024)   Hunger Vital Sign    Worried About Running Out of Food in the Last Year: Never true    Ran Out of Food in the Last Year: Never true  Transportation Needs: No Transportation Needs (01/17/2024)   PRAPARE - Administrator, Civil Service (Medical): No    Lack of Transportation (Non-Medical): No  Physical Activity: Unknown (02/10/2023)   Exercise Vital Sign    Days of Exercise per Week: 0 days    Minutes of Exercise per Session: Not on file  Stress: No Stress Concern Present (02/10/2023)   [employer] of Occupational Health - Occupational Stress  Questionnaire    Feeling of Stress : Only a little  Social Connections: Moderately Integrated (01/17/2024)   Social Connection and Isolation Panel    Frequency of Communication with Friends and Family: More than three times a week    Frequency of Social Gatherings with Friends and Family: Twice a week    Attends Religious Services: 1 to 4 times per year    Active Member of [employer] or Organizations: Yes    Attends Banker Meetings: 1 to 4 times per year    Marital Status: Divorced    MEDICATIONS:  Current Outpatient Medications  Medication Sig Dispense Refill   acetaminophen  (TYLENOL ) 325 MG tablet Take 2 tablets (650 mg total) by mouth every 4 (four) hours as needed for headache or mild pain.     amLODipine  (NORVASC ) 5 MG tablet Take 1 tablet (5 mg total) by mouth daily. 90 tablet 2   aspirin  81 MG chewable tablet Chew 1 tablet (81 mg total) by mouth daily.     atorvastatin  (LIPITOR ) 80 MG tablet Take 1 tablet (80 mg total) by mouth at bedtime. 90 tablet 1   carvedilol  (COREG ) 12.5 MG tablet Take 1 tablet (12.5 mg total) by mouth 2 (two) times daily with a meal. 180 tablet 1   clonazePAM  (KLONOPIN ) 0.25 MG disintegrating tablet Take 1 tablet (0.25 mg total) by mouth at bedtime. 60 tablet 0   dicyclomine  (BENTYL ) 20 MG tablet Take 1 tablet (20 mg total) by mouth 2 (two) times daily as needed. (Patient taking differently: Take 20 mg by mouth 2 (two) times daily as needed for spasms.) 10 tablet 0   divalproex  (DEPAKOTE  ER) 500 MG 24 hr tablet Take 1 tablet (500 mg total) by mouth at bedtime. 30 tablet 0   DULoxetine  (CYMBALTA ) 60 MG capsule Take 2 capsules (120 mg total) by mouth daily. 60 capsule 0   empagliflozin  (JARDIANCE ) 25 MG TABS tablet Take 1 tablet (25 mg total) by mouth daily with breakfast. 90 tablet 3   EPINEPHrine  (EPIPEN  2-PAK) 0.3 mg/0.3 mL IJ SOAJ injection Inject 0.3 mLs (0.3 mg total) into the muscle once as needed for up to 1 dose for anaphylaxis. 2 each 2    gabapentin  (NEURONTIN ) 600 MG tablet Take 1 tablet (600 mg total) by mouth 2 (two) times daily. 180 tablet 1   isosorbide  mononitrate (IMDUR ) 120 MG 24 hr tablet Take 1 tablet (120 mg total) by mouth daily. 90 tablet 2   melatonin 5 MG TABS Take 1 tablet (5 mg total) by mouth at bedtime. 90 tablet 0   metFORMIN  (GLUCOPHAGE ) 500 MG tablet Take 1 tablet (500 mg total) by mouth 2 (two) times daily. 180 tablet 3   mupirocin  ointment (BACTROBAN ) 2 % Apply 1 Application topically 2 (two) times daily. 22 g 0   nitroGLYCERIN  (NITROSTAT ) 0.4 MG SL tablet Place 1 tablet (0.4 mg total) under the tongue every 5 (five) minutes for 3 doses as needed for chest pain. 25 tablet 6   pantoprazole  (PROTONIX ) 40 MG tablet Take 1 tablet (40 mg total) by mouth daily. 90 tablet 1   Rimegepant Sulfate  (NURTEC) 75 MG TBDP Take 1 tablet (75 mg total) by mouth every other day. 15 tablet 11   SKYRIZI PEN 150 MG/ML pen As directed     botulinum toxin Type A  (BOTOX ) 200 units injection Provider to inject 155 units into the muscles  of the head and neck every 3 months. Discard remainder. (Patient not taking: Reported on 02/08/2024) 1 each 2   Continuous Glucose Sensor (DEXCOM G6 SENSOR) MISC Use to monitor blood sugar, change after 10 days 9 each 3   Continuous Glucose Transmitter (DEXCOM G6 TRANSMITTER) MISC Use as directed for 90 days 1 each 3   Insulin  Disposable Pump (OMNIPOD 5 DEXG7G6 PODS GEN 5) MISC Use as directed every 3 (three) days. 10 each 3   insulin  lispro (HUMALOG ) 100 UNIT/ML injection Use up to 60 Units daily in pump (Patient not taking: Reported on 02/08/2024) 20 mL 2   Insulin  Syringe-Needle U-100 (INSULIN  SYRINGE 1CC/31GX5/16) 31G X 5/16 1 ML MISC Use to administer Humalog  3 times a day (Patient not taking: Reported on 02/08/2024) 100 each 0   Current Facility-Administered Medications  Medication Dose Route Frequency Provider Last Rate Last Admin   botulinum toxin Type A  (BOTOX ) injection 155 Units  155 Units  Intramuscular Once Sater, Richard A, MD        PHYSICAL EXAM: Vitals:   02/08/24 1020  BP: 92/60  Pulse: 72  Resp: 20  SpO2: 97%  Weight: 221 lb 3.2 oz (100.3 kg)  Height: 5' 11 (1.803 m)   Body mass index is 30.85 kg/m.  Wt Readings from Last 3 Encounters:  02/08/24 221 lb 3.2 oz (100.3 kg)  02/01/24 215 lb (97.5 kg)  01/16/24 220 lb 2 oz (99.8 kg)    General: Well developed, well nourished male in no apparent distress.  HEENT: AT/[place], no external lesions.  Eyes: Conjunctiva clear and no icterus. Neck: Neck supple  Lungs: Respirations not labored Neurologic: Alert, oriented, normal speech Extremities / Skin: Dry.   Psychiatric: Does not appear depressed or anxious  Diabetic Foot Exam - Simple   No data filed    LABS Reviewed Lab Results  Component Value Date   HGBA1C 7.2 (A) 02/08/2024   HGBA1C 9.1 (A) 10/19/2023   HGBA1C 8.9 10/05/2023   Lab Results  Component Value Date   FRUCTOSAMINE 305 (H) 05/16/2021   FRUCTOSAMINE 342 (H) 12/31/2020   FRUCTOSAMINE 317 (H) 06/18/2020   Lab Results  Component Value Date   CHOL 132 01/20/2023   HDL 41.00 01/20/2023   LDLCALC 68 01/30/2022   LDLDIRECT 59.0 01/20/2023   TRIG 145 08/20/2023   CHOLHDL 3 01/20/2023   Lab Results  Component Value Date   MICRALBCREAT 12.0 01/05/2024   Lab Results  Component Value Date   CREATININE 1.19 01/16/2024   Lab Results  Component Value Date   GFR 64.60 01/05/2024    ASSESSMENT / PLAN  1. Type 2 diabetes mellitus with hyperglycemia, with long-term current use of insulin  (HCC)     Diabetes Mellitus type 2, complicated by peripheral neuropathy and CKD. - Diabetic status / severity: Uncontrolled.  Lab Results  Component Value Date   HGBA1C 7.2 (A) 02/08/2024    - Hemoglobin A1c goal <6.5%   Currently not using insulin  pump for about a month.  Sent prescription for OmniPod 5 with Dexcom G6 sensor and transmitter.  Patient is asked to call our clinic if he is not  able to get pump supplies and Dexcom supplies within 2 weeks.  In the future we will change to OmniPod with Dexcom G7.   - Medications: Low.  Plan: -Continue OmniPod 5 with Dexcom G6, in the future we will plan with OmniPod 5 with Dexcom G7.  He did not bring pump into clinic today.  Resume pump  when he has supplies. -Continue Jardiance  25 mg daily. -Continue metformin  500 mg 2 times a day.   - Home glucose testing: continue CGM and check blood glucose as needed.  - Discussed/ Gave Hypoglycemia treatment plan.  # Consult : not required at this time.   # Annual urine for microalbuminuria/ creatinine ratio, no microalbuminuria currently. Last  Lab Results  Component Value Date   MICRALBCREAT 12.0 01/05/2024    # Foot check nightly / neuropathy, continue gabapentin .  # Annual dilated diabetic eye exams.   - Diet: Make healthy diabetic food choices - Life style / activity / exercise: Discussed.  2. Blood pressure  -  BP Readings from Last 1 Encounters:  02/08/24 92/60    - Control is in target.  - No change in current plans.  3. Lipid status / Hyperlipidemia - Last  Lab Results  Component Value Date   LDLCALC 68 01/30/2022   - Continue atorvastatin  80 mg daily.  Managed by primary care provider. He is also on ? fenofibrate  3 times a week.  Diagnoses and all orders for this visit:  Type 2 diabetes mellitus with hyperglycemia, with long-term current use of insulin  (HCC) -     POCT glycosylated hemoglobin (Hb A1C) -     Continuous Glucose Transmitter (DEXCOM G6 TRANSMITTER) MISC; Use as directed for 90 days -     Continuous Glucose Sensor (DEXCOM G6 SENSOR) MISC; Use to monitor blood sugar, change after 10 days -     Insulin  Disposable Pump (OMNIPOD 5 DEXG7G6 PODS GEN 5) MISC; Use as directed every 3 (three) days.   DISPOSITION Follow up in clinic in 3 months suggested.   All questions answered and patient verbalized understanding of the plan.  James Greenley Martone,  MD [facility] Endocrinology [facility] [address] [place], [address] [place], [place] [address] Phone # [phone]  At least part of this note was generated using voice recognition software. Inadvertent word errors may have occurred, which were not recognized during the proofreading process.

## 2024-02-11 ENCOUNTER — Other Ambulatory Visit: Payer: Self-pay | Admitting: Endocrinology

## 2024-02-11 DIAGNOSIS — E1165 Type 2 diabetes mellitus with hyperglycemia: Secondary | ICD-10-CM

## 2024-02-16 ENCOUNTER — Encounter: Payer: Self-pay | Admitting: Internal Medicine

## 2024-02-16 ENCOUNTER — Telehealth: Payer: Self-pay

## 2024-02-16 ENCOUNTER — Ambulatory Visit (INDEPENDENT_AMBULATORY_CARE_PROVIDER_SITE_OTHER): Admitting: Internal Medicine

## 2024-02-16 VITALS — BP 116/68 | HR 75 | Temp 97.9°F | Resp 18 | Ht 71.0 in | Wt 226.2 lb

## 2024-02-16 DIAGNOSIS — F32A Depression, unspecified: Secondary | ICD-10-CM | POA: Diagnosis not present

## 2024-02-16 DIAGNOSIS — G4700 Insomnia, unspecified: Secondary | ICD-10-CM

## 2024-02-16 DIAGNOSIS — S41152A Open bite of left upper arm, initial encounter: Secondary | ICD-10-CM | POA: Diagnosis not present

## 2024-02-16 DIAGNOSIS — F339 Major depressive disorder, recurrent, unspecified: Secondary | ICD-10-CM

## 2024-02-16 DIAGNOSIS — F323 Major depressive disorder, single episode, severe with psychotic features: Secondary | ICD-10-CM

## 2024-02-16 DIAGNOSIS — E1140 Type 2 diabetes mellitus with diabetic neuropathy, unspecified: Secondary | ICD-10-CM

## 2024-02-16 DIAGNOSIS — W540XXA Bitten by dog, initial encounter: Secondary | ICD-10-CM

## 2024-02-16 NOTE — Progress Notes (Unsigned)
 Subjective:    Patient ID: James [name], male    DOB: 01/18/1956, 68 y.o.   MRN: [id number]  DOS:  02/16/2024 Type of visit - description: Hospital follow-up  Admitted to the hospital and discharged 01/25/2024. Admitted voluntarily due to self-harm ideas. Was discharged in improved condition.  DM: Last visit with endocrinology few days ago, A1c improved to 7.2.  Most recent labs: Creatinine 1.19, LFTs normal,  Review of Systems See above   Past Medical History:  Diagnosis Date   Abnormal cardiac CT angiography    Acid reflux    Annual physical exam 04/08/2015   Arthritis    Atypical chest pain 06/13/2020   Blood transfusion without reported diagnosis    Body mass index (BMI) 35.0-35.9, adult 04/05/2019   Chronic fatigue 01/28/2015   Chronic headaches    on cymbalta    Chronic kidney disease, stage 3b (HCC) 07/14/2023   Chronic migraine w/o aura, not intractable, w/o stat migr 10/24/2018   Cirrhosis (HCC)    Colon polyps    Complication of anesthesia    problems waking up from anesthesia   Coronary artery disease 01/23/2019   Depression    on cymbalta    Diabetes mellitus with neuropathy (HCC)    Diabetes with neuropathy 04/25/2013   Diverticulitis 03/2013   Dyslipidemia 05/29/2019   Eczema    Elevated LFTs    Epidural lipomatosis 10/05/2018   Essential (primary) hypertension 04/05/2019   Essential hypertension 10/10/2019   Fatty liver    GERD (gastroesophageal reflux disease) 04/28/2011   H/O craniotomy 05/07/2015   Headache 04/28/2011   Hepatitis 10/2017   NASH cirrhosis   History of kidney stones    Hyperlipidemia    Hypersomnia with sleep apnea 01/28/2015   Hypertension    IDA (iron  deficiency anemia) 01/24/2019   Idiopathic intracranial hypertension 01/14/2017   Insomnia 04/26/2013   Kidney stone    Liver cirrhosis secondary to NASH (nonalcoholic steatohepatitis) (HCC) 01/02/2016   Low back pain 04/05/2019   Lower back injury 08/14/2019   Morbid  obesity (HCC)    Neuromuscular disorder (HCC)    neuropathy   Neuropathy    Nonalcoholic steatohepatitis 10/05/2018   Obstructive hydrocephalus (HCC) 01/28/2015   OSA -- dx ~ 2012, cpap intolerant 09/04/2014    dx ~ 2012, cpap intolerant    PCP NOTES >>> 04/08/2015   Post-op pain 03/19/2019   Post-traumatic hydrocephalus (HCC)    s/p shunts x 2 (first got infected )   Presence of cerebrospinal fluid drainage device 07/28/2011   Psoriasis    sees Dr Leanord   Psoriatic arthritis (HCC)    REM behavioral disorder 01/14/2017   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   see's Dr.Ortmann   Severe obesity (BMI >= 40) (HCC) 01/28/2015   SI (sacroiliac) joint dysfunction 08/14/2019   Sigmoid diverticulitis 04/25/2013   Sleep apnea    no CPAP      Spondylolisthesis, lumbar region 03/16/2019   Stomach ulcer    Testosterone  deficiency 04/28/2011   VP (ventriculoperitoneal) shunt status 07/31/2020    Past Surgical History:  Procedure Laterality Date   AMPUTATION Left 12/27/2021   Procedure: AMPUTATION GREAT TOE;  Surgeon: Gershon Donnice SAUNDERS, DPM;  Location: [facility] OR;  Service: Podiatry;  Laterality: Left;   BACK SURGERY  1980   BRAIN SURGERY     VP shunts placed in 2007   CHOLECYSTECTOMY N/A 08/25/2017   Procedure: LAPAROSCOPIC CHOLECYSTECTOMY WITH INTRAOPERATIVE CHOLANGIOGRAM;  Surgeon: Curvin Deward MOULD, MD;  Location: [facility] OR;  Service: General;  Laterality: N/A;   COLONOSCOPY     CORONARY STENT INTERVENTION N/A 09/27/2020   Procedure: CORONARY STENT INTERVENTION;  Surgeon: Dann Candyce RAMAN, MD;  Location: [facility] INVASIVE CV LAB;  Service: Cardiovascular;  Laterality: N/A;   CORONARY ULTRASOUND/IVUS N/A 09/27/2020   Procedure: Intravascular Ultrasound/IVUS;  Surgeon: Dann Candyce RAMAN, MD;  Location: [facility] INVASIVE CV LAB;  Service: Cardiovascular;  Laterality: N/A;   JOINT REPLACEMENT     total hip   LEFT HEART CATH N/A 09/27/2020   Procedure: Left Heart Cath;  Surgeon: Dann Candyce RAMAN, MD;  Location: [facility] INVASIVE CV LAB;  Service: Cardiovascular;  Laterality: N/A;   LEFT HEART CATH AND CORONARY ANGIOGRAPHY N/A 09/24/2020   Procedure: LEFT HEART CATH AND CORONARY ANGIOGRAPHY;  Surgeon: Dann Candyce RAMAN, MD;  Location: [facility] INVASIVE CV LAB;  Service: Cardiovascular;  Laterality: N/A;   LEFT HEART CATH AND CORONARY ANGIOGRAPHY N/A 08/18/2021   Procedure: LEFT HEART CATH AND CORONARY ANGIOGRAPHY;  Surgeon: Burnard Debby LABOR, MD;  Location: [facility] INVASIVE CV LAB;  Service: Cardiovascular;  Laterality: N/A;   LUMBAR FUSION  03/16/2019   SHOULDER SURGERY Left 2010   TEE WITHOUT CARDIOVERSION N/A 01/01/2022   Procedure: TRANSESOPHAGEAL ECHOCARDIOGRAM (TEE);  Surgeon: Tobb, Kardie, DO;  Location: [facility] ENDOSCOPY;  Service: Cardiovascular;  Laterality: N/A;   TOE SURGERY Left 2018   TONSILLECTOMY     as a child   TOTAL HIP ARTHROPLASTY Left 2011   UPPER GASTROINTESTINAL ENDOSCOPY  01/04/2020   VENTRICULOPERITONEAL SHUNT  2007   x2    Current Outpatient Medications  Medication Instructions   acetaminophen  (TYLENOL ) 650 mg, Oral, Every 4 hours PRN   amLODipine  (NORVASC ) 5 mg, Oral, Daily   aspirin  81 mg, Oral, Daily   atorvastatin  (LIPITOR ) 80 mg, Oral, Daily at bedtime   botulinum toxin Type A  (BOTOX ) 200 units injection Provider to inject 155 units into the muscles of the head and neck every 3 months. Discard remainder.   carvedilol  (COREG ) 12.5 mg, Oral, 2 times daily with meals   clonazePAM  (KLONOPIN ) 0.25 mg, Oral, Daily at bedtime   Continuous Glucose Sensor (DEXCOM G6 SENSOR) MISC Use to monitor blood sugar, change after 10 days   Continuous Glucose Transmitter (DEXCOM G6 TRANSMITTER) MISC Use as directed for 90 days   dicyclomine  (BENTYL ) 20 mg, Oral, 2 times daily PRN   divalproex  (DEPAKOTE  ER) 500 mg, Oral, Daily at bedtime   DULoxetine  (CYMBALTA ) 120 mg, Oral, Daily   empagliflozin  (JARDIANCE ) 25 mg, Oral, Daily with breakfast   EPINEPHrine  (EPIPEN  2-PAK) 0.3 mg,  Intramuscular, Once PRN   gabapentin  (NEURONTIN ) 600 mg, Oral, 2 times daily   Insulin  Disposable Pump (OMNIPOD 5 DEXG7G6 PODS GEN 5) MISC Use as directed every 3 (three) days.   insulin  lispro (HUMALOG ) 100 UNIT/ML injection Use up to 60 Units daily in pump   Insulin  Syringe-Needle U-100 (INSULIN  SYRINGE 1CC/31GX5/16) 31G X 5/16 1 ML MISC Use to administer Humalog  3 times a day   isosorbide  mononitrate (IMDUR ) 120 mg, Oral, Daily   melatonin 5 mg, Oral, Daily at bedtime   metFORMIN  (GLUCOPHAGE ) 500 MG tablet TAKE ONE TABLET (500 MG TOTAL) BY MOUTH TWICE DAILY AT 9AM & 5PM   mupirocin  ointment (BACTROBAN ) 2 % 1 Application, Topical, 2 times daily   nitroGLYCERIN  (NITROSTAT ) 0.4 MG SL tablet Place 1 tablet (0.4 mg total) under the tongue every 5 (five) minutes for 3 doses as needed for chest pain.   Nurtec 75  mg, Oral, Every other day   pantoprazole  (PROTONIX ) 40 mg, Oral, Daily   SKYRIZI PEN 150 MG/ML pen As directed       Objective:   Physical Exam BP 116/68   Pulse 75   Temp 97.9 F (36.6 C) (Oral)   Resp 18   Ht 5' 11 (1.803 m)   Wt 226 lb 4 oz (102.6 kg)   SpO2 97%   BMI 31.56 kg/m  General:   Well developed, NAD, BMI noted. HEENT:  Normocephalic . Face symmetric, atraumatic Lower extremities: no pretibial edema bilaterally  Skin: At the left triceps area has a couple of excoriations from the dog bite.  No evidence of cellulitis, abscess or openings on exam. Neurologic:  alert & oriented X3.  Speech normal, gait appropriate for age and unassisted Psych--  Cognition and judgment appear intact.  Cooperative with normal attention span and concentration.  Behavior appropriate. No anxious or depressed appearing.      Assessment     Problem list: DM -Per Endo Neuropathy (x years, rx gaba 05-2014, w/u 11-2014  RPR neg, vit D-B12-Folic Acid  wnl ); Saw Dr Tobie, [facility] [phone] (see results) HTN: History of AKI with ARBs  CKD, used to see  nephrology, etiology-  Hyperglycemia, intermittent NSAIDs, contrast exposures Hyperlipidemia (TG in the 500s 2016) OSA , dx 2012,  again 02-2015 Dr Dohmeier--> severe OSA, intolerant to CPAP, not interested on treatment (see OV 12-2022) Depression, insomnia: on Cymbalta  NEURO: --Chronic headaches :on Cymbalta   --Posttraumatic hydrocephalus s/p 2 shunts (first got infected) MSK: on disability d/t back pain- HAs GI:  --GERD, diverticulitis 2014, h/o PUD -- NASH with cirrhosis per GI note 10/2017, s/p Hep A/B shots --Anemia: - felt to be d/t   GAVE (gastric antral vascular ectasia) and a inflammatory polyp, s/p  EGD 10/2018    -Work-up repeated 10/2019: EGD: GAVE  versus portal hypertensive gastropathy. Colonoscopy polyps.  Tubular adenoma. Gastric BX negative H. pylori, reactive changes. Psoriasis, psoriatic arthritis: used  HUMIRA   CAD: CP, PTCA of the LAD 09-2020; cath 07-2021 H/o urolithiasis Hypogonadism  Dx 2012, normal T 11-2014 (on no RX) 09/14/2023: Normal stress test  PLAN Depression and insomnia: Recently admitted to the hospital with suicidal idea in the context of running out of clonazepam  unable to sleep for several days. At the hospital was evaluated by psychiatry, in addition to clonazepam , Cymbalta , he was prescribed Depakote . Plan: Check Depakote  level, strongly encouraged to call the number he was provided to the hospital and get a follow-up with psychiatry. Today's scores: PHQ-9:  4.  GAD 9: 3 Dogbite: Lesion looks good, no evidence of infection or abscess.  Recommend to get rabies vaccine if he is not certain the dog has been vaccinated.  See AVS.  Pt us  up-to-date on Tdap. Polypharmacy: Patient expressed some difficulties keeping up with medications, referred to our clinical pharmacist RTC as scheduled 04-2024.

## 2024-02-16 NOTE — Patient Instructions (Addendum)
 Please call the number that was provided to you and get follow-up with  the psychiatrist. Do not run out of your medications.  You got a dog bite. Talk with the owner of the dog , if you are not completely certain that the dog is vaccinated against rabies, go to the health department and get rabies shot ASAP Watch the dog for 10 days, if the dog is unwell-sick, take yourself to the emergency room to get evaluated.  I am referring you to clinical pharmacists to help you with taking  the medications correctly. If you are not reach out in the next few days let me know.  I will see you in November as scheduled

## 2024-02-16 NOTE — Progress Notes (Signed)
 Pharmacy Quality Measure Review  This patient is appearing on a report for being at risk of failing the adherence measure for cholesterol (statin) medications this calendar year.   Medication: atorvastatin  80 mg once daily Last fill date: 02/03/24 for 30 day supply  Insurance report was not up to date. No action needed at this time.   Jenkins Graces, PharmD PGY1 Pharmacy Resident [phone]

## 2024-02-16 NOTE — Telephone Encounter (Signed)
 LMOM for [facility] [employer]- needing to report a dog bite that Pt experienced on 02/12/24, asking for call back to our office.

## 2024-02-17 ENCOUNTER — Ambulatory Visit: Payer: Self-pay | Admitting: Internal Medicine

## 2024-02-17 LAB — VALPROIC ACID LEVEL: Valproic Acid Lvl: <12.5 mg/L — ABNORMAL LOW (ref 50.0–100.0)

## 2024-02-17 NOTE — Assessment & Plan Note (Signed)
 Depression and insomnia: Recently admitted to the hospital with suicidal idea in the context of running out of clonazepam  unable to sleep for several days. At the hospital was evaluated by psychiatry, in addition to clonazepam , Cymbalta , he was prescribed Depakote . Plan: Check Depakote  level, strongly encouraged to call the number he was provided to the hospital and get a follow-up with psychiatry. Today's scores: PHQ-9:  4.  GAD 9: 3 Dogbite: Lesion looks good, no evidence of infection or abscess.  Recommend to get rabies vaccine if he is not certain the dog has been vaccinated.  See AVS.  Pt us  up-to-date on Tdap. Polypharmacy: Patient expressed some difficulties keeping up with medications, referred to our clinical pharmacist RTC as scheduled 04-2024.

## 2024-02-18 ENCOUNTER — Ambulatory Visit: Admitting: Internal Medicine

## 2024-02-18 NOTE — Telephone Encounter (Signed)
 Received call from [facility]- they are emailing me a blank form from us  to complete.

## 2024-02-21 NOTE — Telephone Encounter (Signed)
 Animal bite report completed and emailed to [email or web address]. Form sent for scanning.

## 2024-02-21 NOTE — Addendum Note (Signed)
 Addended by: AMON ALOYSIUS BRAVO on: 02/21/2024 08:59 PM   Modules accepted: Level of Service

## 2024-02-25 ENCOUNTER — Other Ambulatory Visit (HOSPITAL_COMMUNITY): Payer: Self-pay

## 2024-02-26 ENCOUNTER — Other Ambulatory Visit (HOSPITAL_COMMUNITY): Payer: Self-pay

## 2024-02-29 ENCOUNTER — Other Ambulatory Visit (HOSPITAL_COMMUNITY): Payer: Self-pay

## 2024-02-29 ENCOUNTER — Other Ambulatory Visit: Payer: Self-pay

## 2024-03-01 ENCOUNTER — Other Ambulatory Visit (HOSPITAL_COMMUNITY): Payer: Self-pay

## 2024-03-02 ENCOUNTER — Telehealth: Payer: Self-pay | Admitting: Pharmacist

## 2024-03-02 ENCOUNTER — Other Ambulatory Visit (HOSPITAL_COMMUNITY): Payer: Self-pay

## 2024-03-02 NOTE — Telephone Encounter (Signed)
 Pharmacy Patient Advocate Encounter   Received notification from Patient Pharmacy that prior authorization for Nurtec 75MG  dispersible tablets is required/requested.   Insurance verification completed.   The patient is insured through [employer] .   Per test claim: PA required; PA submitted to above mentioned insurance via Latent Key/confirmation #/EOC [facility] Status is pending

## 2024-03-03 NOTE — Telephone Encounter (Signed)
 Pharmacy Patient Advocate Encounter  Received notification from [facility] that Prior Authorization for Nurtec 75MG  dispersible tabletshas been APPROVED from 03/03/2024 to 06/28/2024   PA #/Case ID/Reference #: [id number]

## 2024-03-06 ENCOUNTER — Telehealth: Payer: Self-pay

## 2024-03-06 NOTE — Progress Notes (Signed)
 Complex Care Management Note Care Guide Note  03/06/2024 Name: James [name] MRN: [id number] DOB: 11/19/1955   Complex Care Management Outreach Attempts: An unsuccessful telephone outreach was attempted today to offer the patient information about available complex care management services.  Follow Up Plan:  Additional outreach attempts will be made to offer the patient complex care management information and services.   Encounter Outcome:  No Answer  Dreama [facility]  [facility], [facility] VBCI Assistant Direct Dial : [phone]  Fax: [phone]

## 2024-03-09 NOTE — Progress Notes (Signed)
 Complex Care Management Note Care Guide Note  03/09/2024 Name: James [name] MRN: [id number] DOB: [birth year]   Complex Care Management Outreach Attempts: A second unsuccessful outreach was attempted today to offer the patient with information about available complex care management services.  Follow Up Plan:  Additional outreach attempts will be made to offer the patient complex care management information and services.   Encounter Outcome:  No Answer  Dreama Lynwood [facility]  [facility], [facility] VBCI Assistant Direct Dial : [phone]  Fax: [phone]

## 2024-03-10 ENCOUNTER — Other Ambulatory Visit: Payer: Self-pay | Admitting: Neurology

## 2024-03-13 NOTE — Telephone Encounter (Signed)
 Last seen 02/01/24 Follow up scheduled 09/06/24   Dispensed Days Supply Quantity Provider Pharmacy  CLONAZEPAM   0.5 MG TABS 03/03/2024 30 30 tablet Sater, Charlie LABOR, MD [facility] [place], ..     Rx pending to be signed

## 2024-03-13 NOTE — Progress Notes (Signed)
 Complex Care Management Note Care Guide Note  03/13/2024 Name: James [name] MRN: [id number] DOB: 1956-05-04   Complex Care Management Outreach Attempts: A third unsuccessful outreach was attempted today to offer the patient with information about available complex care management services.  Follow Up Plan:  No further outreach attempts will be made at this time. We have been unable to contact the patient to offer or enroll patient in complex care management services.  Encounter Outcome:  No Answer  Dreama [facility]wood [facility]  [facility], [facility] VBCI Assistant Direct Dial : [phone]  Fax: [phone]

## 2024-03-28 ENCOUNTER — Telehealth: Payer: Self-pay | Admitting: *Deleted

## 2024-03-28 ENCOUNTER — Encounter

## 2024-03-28 NOTE — Telephone Encounter (Signed)
 Pt was scheduled for AWV today at 1.  No answer x 2. Left message to call and r/s. Pt may be placed on wellness visit 1 schedule at [facility] point.

## 2024-03-30 DIAGNOSIS — Z79899 Other long term (current) drug therapy: Secondary | ICD-10-CM | POA: Diagnosis not present

## 2024-03-30 DIAGNOSIS — N1832 Chronic kidney disease, stage 3b: Secondary | ICD-10-CM | POA: Diagnosis not present

## 2024-03-30 DIAGNOSIS — L409 Psoriasis, unspecified: Secondary | ICD-10-CM | POA: Diagnosis not present

## 2024-03-30 DIAGNOSIS — K7469 Other cirrhosis of liver: Secondary | ICD-10-CM | POA: Diagnosis not present

## 2024-04-07 ENCOUNTER — Other Ambulatory Visit (HOSPITAL_BASED_OUTPATIENT_CLINIC_OR_DEPARTMENT_OTHER): Payer: Self-pay

## 2024-04-07 MED ORDER — CALCIPOTRIENE 0.005 % EX CREA
1.0000 | TOPICAL_CREAM | Freq: Two times a day (BID) | CUTANEOUS | 1 refills | Status: AC
  Filled 2024-04-07 – 2024-04-27 (×2): qty 60, 30d supply, fill #0

## 2024-04-10 ENCOUNTER — Other Ambulatory Visit (HOSPITAL_BASED_OUTPATIENT_CLINIC_OR_DEPARTMENT_OTHER): Payer: Self-pay

## 2024-04-11 ENCOUNTER — Other Ambulatory Visit (HOSPITAL_BASED_OUTPATIENT_CLINIC_OR_DEPARTMENT_OTHER): Payer: Self-pay

## 2024-04-24 ENCOUNTER — Other Ambulatory Visit (HOSPITAL_BASED_OUTPATIENT_CLINIC_OR_DEPARTMENT_OTHER): Payer: Self-pay

## 2024-04-27 ENCOUNTER — Other Ambulatory Visit (HOSPITAL_BASED_OUTPATIENT_CLINIC_OR_DEPARTMENT_OTHER): Payer: Self-pay

## 2024-05-03 ENCOUNTER — Ambulatory Visit: Admitting: Internal Medicine

## 2024-05-10 ENCOUNTER — Ambulatory Visit: Admitting: Urology

## 2024-05-11 ENCOUNTER — Other Ambulatory Visit: Payer: Self-pay

## 2024-05-11 ENCOUNTER — Encounter: Payer: Self-pay | Admitting: Endocrinology

## 2024-05-11 ENCOUNTER — Ambulatory Visit: Payer: Self-pay | Admitting: Endocrinology

## 2024-05-11 ENCOUNTER — Other Ambulatory Visit (HOSPITAL_COMMUNITY): Payer: Self-pay

## 2024-05-11 ENCOUNTER — Ambulatory Visit (INDEPENDENT_AMBULATORY_CARE_PROVIDER_SITE_OTHER): Admitting: Endocrinology

## 2024-05-11 ENCOUNTER — Other Ambulatory Visit (HOSPITAL_BASED_OUTPATIENT_CLINIC_OR_DEPARTMENT_OTHER): Payer: Self-pay

## 2024-05-11 ENCOUNTER — Telehealth: Payer: Self-pay | Admitting: Pharmacy Technician

## 2024-05-11 VITALS — BP 90/60 | HR 81 | Resp 20 | Ht 71.0 in | Wt 229.2 lb

## 2024-05-11 DIAGNOSIS — Z794 Long term (current) use of insulin: Secondary | ICD-10-CM

## 2024-05-11 DIAGNOSIS — E1165 Type 2 diabetes mellitus with hyperglycemia: Secondary | ICD-10-CM

## 2024-05-11 LAB — POCT GLYCOSYLATED HEMOGLOBIN (HGB A1C): Hemoglobin A1C: 10.1 % — AB (ref 4.0–5.6)

## 2024-05-11 MED ORDER — DEXCOM G6 TRANSMITTER MISC
1.0000 | 3 refills | Status: AC
  Filled 2024-05-11: qty 1, 90d supply, fill #0

## 2024-05-11 MED ORDER — DEXCOM G6 SENSOR MISC
3 refills | Status: AC
  Filled 2024-05-11: qty 9, 90d supply, fill #0

## 2024-05-11 NOTE — Addendum Note (Signed)
 Addended by: Clemente Dewey on: 05/11/2024 12:58 PM   Modules accepted: Level of Service

## 2024-05-11 NOTE — Telephone Encounter (Signed)
 Pharmacy Patient Advocate Encounter   Received notification from CoverMyMeds that prior authorization for Dexcom G6 Sensor  is required/requested.   Insurance verification completed.   The patient is insured through [facility].   Per test claim: PA required; PA submitted to above mentioned insurance via Latent Key/confirmation #/EOC [id number] Status is pending

## 2024-05-11 NOTE — Progress Notes (Signed)
 Outpatient Endocrinology Note James Bells, MD  05/11/24  Patient's Name: SALEM [name] [name]    DOB: 1955-08-17    MRN: [id number]                                                    REASON OF VISIT: Follow up for type 2 diabetes mellitus  PCP: Amon Aloysius BRAVO, MD  HISTORY OF PRESENT ILLNESS:   James [name] is a 68 y.o. old male with with past medical history listed below, is here for follow up of type 2 diabetes mellitus.   Pertinent Diabetes History: He was diagnosed with type 2 diabetes mellitus around 2014.  He was initially treated with metformin  and glimepiride .  With the worsening of blood sugar control multidose insulin  regimen was started around 2017.  He has been on OmniPod insulin  pump from November 2021.  Chronic Diabetes Complications : Retinopathy: no. Last ophthalmology exam was done on annually, reportedly. Nephropathy: CKD, no microalbuminuria. Peripheral neuropathy: yes, on gabapentin  Coronary artery disease: no Stroke: no  Relevant comorbidities and cardiovascular risk factors: Obesity: yes Body mass index is 31.97 kg/m.  Hypertension: yes Hyperlipidemia. Yes, on a statin. He has been following with gastroenterology for liver cirrhosis secondary to NASH.  Current / Home Diabetic regimen includes: Metformin  500 mg 2 times a day. Jardiance  25 mg daily.  OMNIPOD 5 insulin  pump settings as follows with Dexcom G6.  Currently has not been using OmniPod. Basal total 10.1 units MN- 0.1u/hour 6AM- 0.3  11AM- 0.5 7PM- 0.8  Bolus CHO Ratio (1unit:CHO) MN- 1:1  Correction/Sensitivity: MN- 1:50  Target:  Active insulin  time:  4 hours  Prior diabetic medications: Amaryl  was stopped June 2024. Januvia   CONTINUOUS GLUCOSE MONITORING SYSTEM (CGMS) / INSULIN  PUMP INTERPRETATION:                         OmniPod 5 Pump & Sensor Download (Reviewed and summarized below.) Pump: Dexcom G6 and OmniPod 5 Dates:    Hypoglycemia: Patient has ? hypoglycemic  episodes. Patient has hypoglycemia awareness.     Factors modifying glucose control: 1.  Diabetic diet assessment: Not eating much due to abdominal pain/diarrhea/acute gastroenteritis.  2.  Staying active or exercising: No formal exercise.  3.  Medication compliance: compliant most of the time.  Interval history Patient has not started OmniPod 5, he reports he does not have Dexcom G6 transmitter not able to refill from the pharmacy.  He however has been taking metformin  and Jardiance .  Hemoglobin A1c today worsened to 10.1%.  No pump or CGM data to download and review today.  No other complaints today.  REVIEW OF SYSTEMS As per history of present illness.   PAST MEDICAL HISTORY: Past Medical History:  Diagnosis Date   Abnormal cardiac CT angiography    Acid reflux    Annual physical exam 04/08/2015   Arthritis    Atypical chest pain 06/13/2020   Blood transfusion without reported diagnosis    Body mass index (BMI) 35.0-35.9, adult 04/05/2019   Chronic fatigue 01/28/2015   Chronic headaches    on cymbalta    Chronic kidney disease, stage 3b (HCC) 07/14/2023   Chronic migraine w/o aura, not intractable, w/o stat migr 10/24/2018   Cirrhosis (HCC)    Colon polyps    Complication of anesthesia  problems waking up from anesthesia   Coronary artery disease 01/23/2019   Depression    on cymbalta    Diabetes mellitus with neuropathy (HCC)    Diabetes with neuropathy 04/25/2013   Diverticulitis 03/2013   Dyslipidemia 05/29/2019   Eczema    Elevated LFTs    Epidural lipomatosis 10/05/2018   Essential (primary) hypertension 04/05/2019   Essential hypertension 10/10/2019   Fatty liver    GERD (gastroesophageal reflux disease) 04/28/2011   H/O craniotomy 05/07/2015   Headache 04/28/2011   Hepatitis 10/2017   NASH cirrhosis   History of kidney stones    Hyperlipidemia    Hypersomnia with sleep apnea 01/28/2015   Hypertension    IDA (iron  deficiency anemia) 01/24/2019    Idiopathic intracranial hypertension 01/14/2017   Insomnia 04/26/2013   Kidney stone    Liver cirrhosis secondary to NASH (nonalcoholic steatohepatitis) (HCC) 01/02/2016   Low back pain 04/05/2019   Lower back injury 08/14/2019   Morbid obesity (HCC)    Neuromuscular disorder (HCC)    neuropathy   Neuropathy    Nonalcoholic steatohepatitis 10/05/2018   Obstructive hydrocephalus (HCC) 01/28/2015   OSA -- dx ~ 2012, cpap intolerant 09/04/2014    dx ~ 2012, cpap intolerant    PCP NOTES >>> 04/08/2015   Post-op pain 03/19/2019   Post-traumatic hydrocephalus (HCC)    s/p shunts x 2 (first got infected )   Presence of cerebrospinal fluid drainage device 07/28/2011   Psoriasis    sees Dr Leanord   Psoriatic arthritis (HCC)    REM behavioral disorder 01/14/2017   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   see's Dr.Ortmann   Severe obesity (BMI >= 40) (HCC) 01/28/2015   SI (sacroiliac) joint dysfunction 08/14/2019   Sigmoid diverticulitis 04/25/2013   Sleep apnea    no CPAP      Spondylolisthesis, lumbar region 03/16/2019   Stomach ulcer    Testosterone  deficiency 04/28/2011   VP (ventriculoperitoneal) shunt status 07/31/2020    PAST SURGICAL HISTORY: Past Surgical History:  Procedure Laterality Date   AMPUTATION Left 12/27/2021   Procedure: AMPUTATION GREAT TOE;  Surgeon: Gershon Donnice SAUNDERS, DPM;  Location: [facility] OR;  Service: Podiatry;  Laterality: Left;   BACK SURGERY  1980   BRAIN SURGERY     VP shunts placed in 2007   CHOLECYSTECTOMY N/A 08/25/2017   Procedure: LAPAROSCOPIC CHOLECYSTECTOMY WITH INTRAOPERATIVE CHOLANGIOGRAM;  Surgeon: Curvin Deward MOULD, MD;  Location: [facility] OR;  Service: General;  Laterality: N/A;   COLONOSCOPY     CORONARY STENT INTERVENTION N/A 09/27/2020   Procedure: CORONARY STENT INTERVENTION;  Surgeon: Dann Candyce RAMAN, MD;  Location: [facility] INVASIVE CV LAB;  Service: Cardiovascular;  Laterality: N/A;   CORONARY ULTRASOUND/IVUS N/A 09/27/2020    Procedure: Intravascular Ultrasound/IVUS;  Surgeon: Dann Candyce RAMAN, MD;  Location[facility] INVASIVE CV LAB;  Service: Cardiovascular;  Laterality: N/A;   JOINT REPLACEMENT     total hip   LEFT HEART CATH N/A 09/27/2020   Procedure: Left Heart Cath;  Surgeon: Dann Candyce RAMAN, MD;  Location: [facility] INVASIVE CV LAB;  Service: Cardiovascular;  Laterality: N/A;   LEFT HEART CATH AND CORONARY ANGIOGRAPHY N/A 09/24/2020   Procedure: LEFT HEART CATH AND CORONARY ANGIOGRAPHY;  Surgeon: Dann Candyce RAMAN, MD;  Location: [facility] INVASIVE CV LAB;  Service: Cardiovascular;  Laterality: N/A;   LEFT HEART CATH AND CORONARY ANGIOGRAPHY N/A 08/18/2021   Procedure: LEFT HEART CATH AND CORONARY ANGIOGRAPHY;  Surgeon: Burnard Debby LABOR, MD;  Location: [facility] INVASIVE CV  LAB;  Service: Cardiovascular;  Laterality: N/A;   LUMBAR FUSION  03/16/2019   SHOULDER SURGERY Left 2010   TEE WITHOUT CARDIOVERSION N/A 01/01/2022   Procedure: TRANSESOPHAGEAL ECHOCARDIOGRAM (TEE);  Surgeon: Tobb, Kardie, DO;  Location: [facility] ENDOSCOPY;  Service: Cardiovascular;  Laterality: N/A;   TOE SURGERY Left 2018   TONSILLECTOMY     as a child   TOTAL HIP ARTHROPLASTY Left 2011   UPPER GASTROINTESTINAL ENDOSCOPY  01/04/2020   VENTRICULOPERITONEAL SHUNT  2007   x2    ALLERGIES: Allergies  Allergen Reactions   Bee Venom Anaphylaxis   Hydrocodone  Bit-Homatrop Mbr Other (See Comments)    Hallucinations, confusion, delirium Depressed feeling   Toradol  [Ketorolac  Tromethamine ] Other (See Comments)    Hallucinations, confusion, delirium   Prednisone     Patient reports it causes cirrhosis to flare up   Sulfadiazine     NDC Rniz:[id number] NDC Rniz:[id number] NDC Rniz:[id number]   Morphine And Codeine Other (See Comments)    Hallucinations, back in the 80s. States has taken vicodin before w/o problems    Sulfa Drugs Cross Reactors Rash    FAMILY HISTORY:  Family History  Problem Relation Age of Onset   Healthy Mother    Lung cancer  Father        former smoker   Heart disease Brother        MI age 37   Other Brother        Murdered   Down syndrome Son    Diabetes Neg Hx    Prostate cancer Neg Hx    Colon cancer Neg Hx    Stomach cancer Neg Hx    Pancreatic cancer Neg Hx    Liver disease Neg Hx     SOCIAL HISTORY: Social History   Socioeconomic History   Marital status: Married    Spouse name: [name]   Number of children: 2   Years of education: Not on file   Highest education level: 12th grade  Occupational History   Occupation: disabled   Tobacco Use   Smoking status: Never   Smokeless tobacco: Never  Vaping Use   Vaping status: Never Used  Substance and Sexual Activity   Alcohol use: No   Drug use: No   Sexual activity: Yes    Partners: Female  Other Topics Concern   Not on file  Social History Narrative   Household-- pt , wife, one adult son with Down's syndrome   younger son lives in [place]   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   Social Drivers of Corporate Investment Banker Strain: Low Risk  (02/10/2023)   Overall Financial Resource Strain (CARDIA)    Difficulty of Paying Living Expenses: Not hard at all  Food Insecurity: No Food Insecurity (01/17/2024)   Hunger Vital Sign    Worried About Running Out of Food in the Last Year: Never true    Ran Out of Food in the Last Year: Never true  Transportation Needs: No Transportation Needs (01/17/2024)   PRAPARE - Administrator, Civil Service (Medical): No    Lack of Transportation (Non-Medical): No  Physical Activity: Unknown (02/10/2023)   Exercise Vital Sign    Days of Exercise per Week: 0 days    Minutes of Exercise per Session: Not on file  Stress: No Stress Concern Present (02/10/2023)   [employer] of Occupational Health - Occupational Stress Questionnaire    Feeling of Stress : Only a  little  Social Connections: Moderately Integrated (01/17/2024)   Social Connection and  Isolation Panel    Frequency of Communication with Friends and Family: More than three times a week    Frequency of Social Gatherings with Friends and Family: Twice a week    Attends Religious Services: 1 to 4 times per year    Active Member of [employer] or Organizations: Yes    Attends Banker Meetings: 1 to 4 times per year    Marital Status: Divorced    MEDICATIONS:  Current Outpatient Medications  Medication Sig Dispense Refill   acetaminophen  (TYLENOL ) 325 MG tablet Take 2 tablets (650 mg total) by mouth every 4 (four) hours as needed for headache or mild pain.     amLODipine  (NORVASC ) 5 MG tablet Take 1 tablet (5 mg total) by mouth daily. 90 tablet 2   aspirin  81 MG chewable tablet Chew 1 tablet (81 mg total) by mouth daily.     atorvastatin  (LIPITOR ) 80 MG tablet Take 1 tablet (80 mg total) by mouth at bedtime. 90 tablet 1   botulinum toxin Type A  (BOTOX ) 200 units injection Provider to inject 155 units into the muscles of the head and neck every 3 months. Discard remainder. 1 each 2   calcipotriene (DOVONOX) 0.005 % cream Apply 1 Application topically 2 (two) times daily. 60 g 1   carvedilol  (COREG ) 12.5 MG tablet Take 1 tablet (12.5 mg total) by mouth 2 (two) times daily with a meal. 180 tablet 1   clonazePAM  (KLONOPIN ) 0.25 MG disintegrating tablet Take 1 tablet (0.25 mg total) by mouth at bedtime. 60 tablet 0   clonazePAM  (KLONOPIN ) 0.5 MG tablet TAKE ONE TABLET (0.5 MG) BY MOUTH AT BEDTIME 30 tablet 5   dicyclomine  (BENTYL ) 20 MG tablet Take 1 tablet (20 mg total) by mouth 2 (two) times daily as needed. (Patient taking differently: Take 20 mg by mouth 2 (two) times daily as needed for spasms.) 10 tablet 0   divalproex  (DEPAKOTE  ER) 500 MG 24 hr tablet Take 1 tablet (500 mg total) by mouth at bedtime. 30 tablet 0   DULoxetine  (CYMBALTA ) 60 MG capsule Take 2 capsules (120 mg total) by mouth daily. 60 capsule 0   empagliflozin  (JARDIANCE ) 25 MG TABS tablet Take 1 tablet (25  mg total) by mouth daily with breakfast. 90 tablet 3   EPINEPHrine  (EPIPEN  2-PAK) 0.3 mg/0.3 mL IJ SOAJ injection Inject 0.3 mLs (0.3 mg total) into the muscle once as needed for up to 1 dose for anaphylaxis. 2 each 2   gabapentin  (NEURONTIN ) 600 MG tablet Take 1 tablet (600 mg total) by mouth 2 (two) times daily. 180 tablet 1   insulin  lispro (HUMALOG ) 100 UNIT/ML injection Use up to 60 Units daily in pump 20 mL 2   Insulin  Syringe-Needle U-100 (INSULIN  SYRINGE 1CC/31GX5/16) 31G X 5/16 1 ML MISC Use to administer Humalog  3 times a day 100 each 0   isosorbide  mononitrate (IMDUR ) 120 MG 24 hr tablet Take 1 tablet (120 mg total) by mouth daily. 90 tablet 2   melatonin 5 MG TABS Take 1 tablet (5 mg total) by mouth at bedtime. 90 tablet 0   metFORMIN  (GLUCOPHAGE ) 500 MG tablet TAKE ONE TABLET (500 MG TOTAL) BY MOUTH TWICE DAILY AT 9AM & 5PM 180 tablet 3   mupirocin  ointment (BACTROBAN ) 2 % Apply 1 Application topically 2 (two) times daily. 22 g 0   nitroGLYCERIN  (NITROSTAT ) 0.4 MG SL tablet Place 1 tablet (0.4 mg total)  under the tongue every 5 (five) minutes for 3 doses as needed for chest pain. 25 tablet 6   pantoprazole  (PROTONIX ) 40 MG tablet Take 1 tablet (40 mg total) by mouth daily. 90 tablet 1   Rimegepant Sulfate  (NURTEC) 75 MG TBDP Take 1 tablet (75 mg total) by mouth every other day. 15 tablet 11   SKYRIZI PEN 150 MG/ML pen As directed     Continuous Glucose Sensor (DEXCOM G6 SENSOR) MISC Use to monitor blood sugar, change after 10 days 9 each 3   Continuous Glucose Transmitter (DEXCOM G6 TRANSMITTER) MISC Use as directed for 90 days 1 each 3   Insulin  Disposable Pump (OMNIPOD 5 DEXG7G6 PODS GEN 5) MISC Use as directed every 3 (three) days. (Patient not taking: Reported on 05/11/2024) 10 each 3   Current Facility-Administered Medications  Medication Dose Route Frequency Provider Last Rate Last Admin   botulinum toxin Type A  (BOTOX ) injection 155 Units  155 Units Intramuscular Once Sater,  Richard A, MD        PHYSICAL EXAM: Vitals:   05/11/24 1012  BP: 90/60  Pulse: 81  Resp: 20  SpO2: 96%  Weight: 229 lb 3.2 oz (104 kg)  Height: 5' 11 (1.803 m)   Body mass index is 31.97 kg/m.  Wt Readings from Last 3 Encounters:  05/11/24 229 lb 3.2 oz (104 kg)  02/16/24 226 lb 4 oz (102.6 kg)  02/08/24 221 lb 3.2 oz (100.3 kg)    General: Well developed, well nourished male in no apparent distress.  HEENT: AT/[place], no external lesions.  Eyes: Conjunctiva clear and no icterus. Neck: Neck supple  Lungs: Respirations not labored Neurologic: Alert, oriented, normal speech Extremities / Skin: Dry.   Psychiatric: Does not appear depressed or anxious  Diabetic Foot Exam - Simple   No data filed    LABS Reviewed Lab Results  Component Value Date   HGBA1C 10.1 (A) 05/11/2024   HGBA1C 7.2 (A) 02/08/2024   HGBA1C 9.1 (A) 10/19/2023   Lab Results  Component Value Date   FRUCTOSAMINE 305 (H) 05/16/2021   FRUCTOSAMINE 342 (H) 12/31/2020   FRUCTOSAMINE 317 (H) 06/18/2020   Lab Results  Component Value Date   CHOL 132 01/20/2023   HDL 41.00 01/20/2023   LDLCALC 68 01/30/2022   LDLDIRECT 59.0 01/20/2023   TRIG 145 08/20/2023   CHOLHDL 3 01/20/2023   Lab Results  Component Value Date   MICRALBCREAT 12.0 01/05/2024   Lab Results  Component Value Date   CREATININE 1.19 01/16/2024   Lab Results  Component Value Date   GFR 64.60 01/05/2024    ASSESSMENT / PLAN  1. Uncontrolled type 2 diabetes mellitus with hyperglycemia, with long-term current use of insulin  (HCC)   2. Type 2 diabetes mellitus with hyperglycemia, with long-term current use of insulin  (HCC)     Diabetes Mellitus type 2, complicated by peripheral neuropathy and CKD. - Diabetic status / severity: Uncontrolled.  Worsening.  Lab Results  Component Value Date   HGBA1C 10.1 (A) 05/11/2024    - Hemoglobin A1c goal <6.5%   Diabetes control is worsening due to not being on insulin   pump.  Currently not using insulin  pump.  Advised patient to restart OmniPod 5 as soon as possible.  Sent prescription for Dexcom G6 and transmitter.  Asked patient to get from pharmacy, if not able to refill call our clinic.  Can also consider Dexcom G7.  - Medications:  Plan: -Continue OmniPod 5 with Dexcom G6, in the  future we will plan with OmniPod 5 with Dexcom G7.  He did not bring pump into clinic today.  Resume pump when he has supplies.  Sent prescription. -Continue Jardiance  25 mg daily. -Continue metformin  500 mg 2 times a day.   Patient expressed he can restart pump by himself.  Asked patient to call our clinic if he needs any help we can set up diabetic educator visit.  - Home glucose testing: continue CGM and check blood glucose as needed.  - Discussed/ Gave Hypoglycemia treatment plan.  # Consult : not required at this time.   # Annual urine for microalbuminuria/ creatinine ratio, no microalbuminuria currently. Last  Lab Results  Component Value Date   MICRALBCREAT 12.0 01/05/2024    # Foot check nightly / neuropathy, continue gabapentin .  # Annual dilated diabetic eye exams.   - Diet: Make healthy diabetic food choices - Life style / activity / exercise: Discussed.  2. Blood pressure  -  BP Readings from Last 1 Encounters:  05/11/24 90/60    - Control is in target.  - No change in current plans.  3. Lipid status / Hyperlipidemia - Last  Lab Results  Component Value Date   LDLCALC 68 01/30/2022   - Continue atorvastatin  80 mg daily.  Managed by primary care provider. He is also on ? fenofibrate  3 times a week.  Diagnoses and all orders for this visit:  Uncontrolled type 2 diabetes mellitus with hyperglycemia, with long-term current use of insulin  (HCC) -     POCT glycosylated hemoglobin (Hb A1C)  Type 2 diabetes mellitus with hyperglycemia, with long-term current use of insulin  (HCC) -     Continuous Glucose Transmitter (DEXCOM G6 TRANSMITTER) MISC;  Use as directed for 90 days -     Continuous Glucose Sensor (DEXCOM G6 SENSOR) MISC; Use to monitor blood sugar, change after 10 days   DISPOSITION Follow up in clinic in 3 months suggested.   All questions answered and patient verbalized understanding of the plan.  Gabriela Giannelli, MD [facility] Endocrinology [facility] [address] [place], [address] [place], [place] [address] Phone # [phone]  At least part of this note was generated using voice recognition software. Inadvertent word errors may have occurred, which were not recognized during the proofreading process.

## 2024-05-12 ENCOUNTER — Other Ambulatory Visit (HOSPITAL_BASED_OUTPATIENT_CLINIC_OR_DEPARTMENT_OTHER): Payer: Self-pay

## 2024-05-12 NOTE — Telephone Encounter (Signed)
 Thank you for clarifying! Appeal cancelled.

## 2024-05-12 NOTE — Telephone Encounter (Signed)
 Pharmacy Patient Advocate Encounter  Received notification from [facility] that Prior Authorization for Dexcom G6 Sensor  has been DENIED.  Full denial letter will be uploaded to the media tab. See denial reason below.     PA #/Case ID/Reference #: [id number]  **Routed to the Appeal pool since he does take insulin .**

## 2024-05-15 ENCOUNTER — Other Ambulatory Visit: Payer: Self-pay | Admitting: Internal Medicine

## 2024-05-16 ENCOUNTER — Ambulatory Visit (INDEPENDENT_AMBULATORY_CARE_PROVIDER_SITE_OTHER)

## 2024-05-16 VITALS — Ht 71.0 in | Wt 229.0 lb

## 2024-05-16 DIAGNOSIS — Z0000 Encounter for general adult medical examination without abnormal findings: Secondary | ICD-10-CM

## 2024-05-16 NOTE — Patient Instructions (Addendum)
 [name],  Thank you for taking the time for your Medicare Wellness Visit. I appreciate your continued commitment to your health goals. Please review the care plan we discussed, and feel free to reach out if I can assist you further.  Please note that Annual Wellness Visits do not include a physical exam. Some assessments may be limited, especially if the visit was conducted virtually. If needed, we may recommend an in-person follow-up with your provider.  Ongoing Care Seeing your primary care provider every 3 to 6 months helps us  monitor your health and provide consistent, personalized care.   Referrals If a referral was made during today's visit and you haven't received any updates within two weeks, please contact the referred provider directly to check on the status.  Recommended Screenings:  Health Maintenance  Topic Date Due   Zoster (Shingles) Vaccine (1 of 2) Never done   Eye exam for diabetics  01/28/2024   COVID-19 Vaccine (8 - 2025-26 season) 02/28/2024   Flu Shot  09/26/2024*   Complete foot exam   10/04/2024   Hemoglobin A1C  11/08/2024   Yearly kidney health urinalysis for diabetes  01/04/2025   Yearly kidney function blood test for diabetes  01/15/2025   Medicare Annual Wellness Visit  05/16/2025   Colon Cancer Screening  01/19/2028   DTaP/Tdap/Td vaccine (3 - Td or Tdap) 10/01/2031   Pneumococcal Vaccine for age over 88  Completed   Hepatitis C Screening  Completed   Meningitis B Vaccine  Aged Out   Hepatitis B Vaccine  Discontinued  *Topic was postponed. The date shown is not the original due date.       05/16/2024   12:43 PM  Advanced Directives  Does Patient Have a Medical Advance Directive? Yes  Type of Estate Agent of [place];Living will  Does patient want to make changes to medical advance directive? No - Patient declined  Copy of Healthcare Power of Attorney in Chart? No - copy requested    Vision: Annual vision screenings are  recommended for early detection of glaucoma, cataracts, and diabetic retinopathy. These exams can also reveal signs of chronic conditions such as diabetes and high blood pressure.  Dental: Annual dental screenings help detect early signs of oral cancer, gum disease, and other conditions linked to overall health, including heart disease and diabetes.  Please see the attached documents for additional preventive care recommendations.

## 2024-05-16 NOTE — Progress Notes (Signed)
 Chief Complaint  Patient presents with   Medicare Wellness     Subjective:   James [name] is a 68 y.o. male who presents for a Medicare Annual Wellness Visit.  Allergies (verified) Bee venom, Hydrocodone  bit-homatrop mbr, Toradol  [ketorolac  tromethamine ], Prednisone, Sulfadiazine, Morphine and codeine, and Sulfa drugs cross reactors   History: Past Medical History:  Diagnosis Date   Abnormal cardiac CT angiography    Acid reflux    Annual physical exam 04/08/2015   Arthritis    Atypical chest pain 06/13/2020   Blood transfusion without reported diagnosis    Body mass index (BMI) 35.0-35.9, adult 04/05/2019   Chronic fatigue 01/28/2015   Chronic headaches    on cymbalta    Chronic kidney disease, stage 3b (HCC) 07/14/2023   Chronic migraine w/o aura, not intractable, w/o stat migr 10/24/2018   Cirrhosis (HCC)    Colon polyps    Complication of anesthesia    problems waking up from anesthesia   Coronary artery disease 01/23/2019   Depression    on cymbalta    Diabetes mellitus with neuropathy (HCC)    Diabetes with neuropathy 04/25/2013   Diverticulitis 03/2013   Dyslipidemia 05/29/2019   Eczema    Elevated LFTs    Epidural lipomatosis 10/05/2018   Essential (primary) hypertension 04/05/2019   Essential hypertension 10/10/2019   Fatty liver    GERD (gastroesophageal reflux disease) 04/28/2011   H/O craniotomy 05/07/2015   Headache 04/28/2011   Hepatitis 10/2017   NASH cirrhosis   History of kidney stones    Hyperlipidemia    Hypersomnia with sleep apnea 01/28/2015   Hypertension    IDA (iron  deficiency anemia) 01/24/2019   Idiopathic intracranial hypertension 01/14/2017   Insomnia 04/26/2013   Kidney stone    Liver cirrhosis secondary to NASH (nonalcoholic steatohepatitis) (HCC) 01/02/2016   Low back pain 04/05/2019   Lower back injury 08/14/2019   Morbid obesity (HCC)    Neuromuscular disorder (HCC)    neuropathy   Neuropathy    Nonalcoholic  steatohepatitis 10/05/2018   Obstructive hydrocephalus (HCC) 01/28/2015   OSA -- dx ~ 2012, cpap intolerant 09/04/2014    dx ~ 2012, cpap intolerant    PCP NOTES >>> 04/08/2015   Post-op pain 03/19/2019   Post-traumatic hydrocephalus (HCC)    s/p shunts x 2 (first got infected )   Presence of cerebrospinal fluid drainage device 07/28/2011   Psoriasis    sees Dr Leanord   Psoriatic arthritis (HCC)    REM behavioral disorder 01/14/2017   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   see's Dr.Ortmann   Severe obesity (BMI >= 40) (HCC) 01/28/2015   SI (sacroiliac) joint dysfunction 08/14/2019   Sigmoid diverticulitis 04/25/2013   Sleep apnea    no CPAP      Spondylolisthesis, lumbar region 03/16/2019   Stomach ulcer    Testosterone  deficiency 04/28/2011   VP (ventriculoperitoneal) shunt status 07/31/2020   Past Surgical History:  Procedure Laterality Date   AMPUTATION Left 12/27/2021   Procedure: AMPUTATION GREAT TOE;  Surgeon: Gershon Donnice SAUNDERS, DPM;  Location: [facility] OR;  Service: Podiatry;  Laterality: Left;   BACK SURGERY  1980   BRAIN SURGERY     VP shunts placed in 2007   CHOLECYSTECTOMY N/A 08/25/2017   Procedure: LAPAROSCOPIC CHOLECYSTECTOMY WITH INTRAOPERATIVE CHOLANGIOGRAM;  Surgeon: Curvin Deward MOULD, MD;  Location[facility] OR;  Service: General;  Laterality: N/A;   COLONOSCOPY     CORONARY STENT INTERVENTION N/A 09/27/2020   Procedure:  CORONARY STENT INTERVENTION;  Surgeon: Dann Candyce RAMAN, MD;  Location: [facility] INVASIVE CV LAB;  Service: Cardiovascular;  Laterality: N/A;   CORONARY ULTRASOUND/IVUS N/A 09/27/2020   Procedure: Intravascular Ultrasound/IVUS;  Surgeon: Dann Candyce RAMAN, MD;  Location: [facility] INVASIVE CV LAB;  Service: Cardiovascular;  Laterality: N/A;   JOINT REPLACEMENT     total hip   LEFT HEART CATH N/A 09/27/2020   Procedure: Left Heart Cath;  Surgeon: Dann Candyce RAMAN, MD;  Location: [facility] INVASIVE CV LAB;  Service: Cardiovascular;  Laterality: N/A;   LEFT  HEART CATH AND CORONARY ANGIOGRAPHY N/A 09/24/2020   Procedure: LEFT HEART CATH AND CORONARY ANGIOGRAPHY;  Surgeon: Dann Candyce RAMAN, MD;  Location: [facility] INVASIVE CV LAB;  Service: Cardiovascular;  Laterality: N/A;   LEFT HEART CATH AND CORONARY ANGIOGRAPHY N/A 08/18/2021   Procedure: LEFT HEART CATH AND CORONARY ANGIOGRAPHY;  Surgeon: Burnard Debby LABOR, MD;  Location: [facility] INVASIVE CV LAB;  Service: Cardiovascular;  Laterality: N/A;   LUMBAR FUSION  03/16/2019   SHOULDER SURGERY Left 2010   TEE WITHOUT CARDIOVERSION N/A 01/01/2022   Procedure: TRANSESOPHAGEAL ECHOCARDIOGRAM (TEE);  Surgeon: Sheena Pugh, DO;  Location: [facility] ENDOSCOPY;  Service: Cardiovascular;  Laterality: N/A;   TOE SURGERY Left 2018   TONSILLECTOMY     as a child   TOTAL HIP ARTHROPLASTY Left 2011   UPPER GASTROINTESTINAL ENDOSCOPY  01/04/2020   VENTRICULOPERITONEAL SHUNT  2007   x2   Family History  Problem Relation Age of Onset   Healthy Mother    Lung cancer Father        former smoker   Heart disease Brother        MI age 53   Other Brother        Murdered   Down syndrome Son    Diabetes Neg Hx    Prostate cancer Neg Hx    Colon cancer Neg Hx    Stomach cancer Neg Hx    Pancreatic cancer Neg Hx    Liver disease Neg Hx    Social History   Occupational History   Occupation: disabled   Tobacco Use   Smoking status: Never   Smokeless tobacco: Never  Vaping Use   Vaping status: Never Used  Substance and Sexual Activity   Alcohol use: No   Drug use: No   Sexual activity: Yes    Partners: Female   Tobacco Counseling Counseling given: No  SDOH Screenings   Food Insecurity: No Food Insecurity (05/16/2024)  Housing: Unknown (05/16/2024)  Transportation Needs: No Transportation Needs (05/16/2024)  Utilities: Not At Risk (05/16/2024)  Alcohol Screen: Low Risk  (01/17/2024)  Depression (PHQ2-9): Low Risk  (05/16/2024)  Financial Resource Strain: Low Risk  (02/10/2023)  Physical Activity: Inactive  (05/16/2024)  Social Connections: Socially Isolated (05/16/2024)  Stress: No Stress Concern Present (05/16/2024)  Tobacco Use: Low Risk  (05/16/2024)  Health Literacy: Adequate Health Literacy (05/16/2024)   See flowsheets for full screening details  Depression Screen PHQ 2 & 9 Depression Scale- Over the past 2 weeks, how often have you been bothered by any of the following problems? Little interest or pleasure in doing things: 0 Feeling down, depressed, or hopeless (PHQ Adolescent also includes...irritable): 0 PHQ-2 Total Score: 0 Trouble falling or staying asleep, or sleeping too much: 2 Feeling tired or having little energy: 2 Poor appetite or overeating (PHQ Adolescent also includes...weight loss): 0 Feeling bad about yourself - or that you are a failure or have let yourself or your family down:  0 Trouble concentrating on things, such as reading the newspaper or watching television [facility] Adolescent also includes...like school work): 0 Moving or speaking so slowly that other people could have noticed. Or the opposite - being so fidgety or restless that you have been moving around a lot more than usual: 0 Thoughts that you would be better off dead, or of hurting yourself in some way: 0 PHQ-9 Total Score: 4     Goals Addressed               This Visit's Progress     Increase physical activity (pt-stated)        Remain active       Visit info / Clinical Intake: Medicare Wellness Visit Type:: Subsequent Annual Wellness Visit Persons participating in visit:: patient Medicare Wellness Visit Mode:: Telephone If telephone:: video declined Because this visit was a virtual/telehealth visit:: pt reported vitals If Telephone or Video please confirm:: I connected with the patient using audio enabled telemedicine application and verified that I am speaking with the correct person using two identifiers Patient Location:: Home Provider Location:: Office Information given by::  patient Interpreter Needed?: No Pre-visit prep was completed: no AWV questionnaire completed by patient prior to visit?: no Living arrangements:: lives with spouse/significant other Patient's Overall Health Status Rating: good Typical amount of pain: none Does pain affect daily life?: no Are you currently prescribed opioids?: no  Dietary Habits and Nutritional Risks How many meals a day?: 3 Eats fruit and vegetables daily?: yes Most meals are obtained by: preparing own meals In the last 2 weeks, have you had any of the following?: none Diabetic:: (!) yes Any non-healing wounds?: no How often do you check your BS?: as needed Would you like to be referred to a Nutritionist or for Diabetic Management? : no  Functional Status Activities of Daily Living (to include ambulation/medication): Independent Ambulation: Independent with device- listed below Home Assistive Devices/Equipment: Dentures (specify type); Eyeglasses Medication Administration: Independent Home Management: Independent Manage your own finances?: yes Primary transportation is: driving Concerns about vision?: no *vision screening is required for WTM* Concerns about hearing?: no  Fall Screening Falls in the past year?: 0 Number of falls in past year: 0 Was there an injury with Fall?: 0 Fall Risk Category Calculator: 0 Patient Fall Risk Level: Low Fall Risk  Fall Risk Patient at Risk for Falls Due to: No Fall Risks Fall risk Follow up: Falls evaluation completed; Education provided  Home and Transportation Safety: All rugs have non-skid backing?: yes All stairs or steps have railings?: yes Grab bars in the bathtub or shower?: yes Have non-skid surface in bathtub or shower?: yes Good home lighting?: yes Regular seat belt use?: yes Hospital stays in the last year:: no  Cognitive Assessment Difficulty concentrating, remembering, or making decisions? : no Will 6CIT or Mini Cog be Completed: no 6CIT or Mini Cog  Declined: patient alert, oriented, able to answer questions appropriately and recall recent events  Advance Directives (For Healthcare) Does Patient Have a Medical Advance Directive?: Yes Does patient want to make changes to medical advance directive?: No - Patient declined Type of Advance Directive: Healthcare Power of [place]; Living will Copy of Healthcare Power of Attorney in Chart?: No - copy requested Copy of Living Will in Chart?: No - copy requested Would patient like information on creating a medical advance directive?: No - Patient declined  Reviewed/Updated  Reviewed/Updated: Reviewed All (Medical, Surgical, Family, Medications, Allergies, Care Teams, Patient Goals)  Objective:    Today's Vitals   05/16/24 1243  Weight: 229 lb (103.9 kg)  Height: 5' 11 (1.803 m)   Body mass index is 31.94 kg/m.  Current Medications (verified) Outpatient Encounter Medications as of 05/16/2024  Medication Sig   acetaminophen  (TYLENOL ) 325 MG tablet Take 2 tablets (650 mg total) by mouth every 4 (four) hours as needed for headache or mild pain.   amLODipine  (NORVASC ) 5 MG tablet Take 1 tablet (5 mg total) by mouth daily.   aspirin  81 MG chewable tablet Chew 1 tablet (81 mg total) by mouth daily.   atorvastatin  (LIPITOR ) 80 MG tablet Take 1 tablet (80 mg total) by mouth at bedtime.   botulinum toxin Type A  (BOTOX ) 200 units injection Provider to inject 155 units into the muscles of the head and neck every 3 months. Discard remainder.   calcipotriene (DOVONOX) 0.005 % cream Apply 1 Application topically 2 (two) times daily.   carvedilol  (COREG ) 12.5 MG tablet Take 1 tablet (12.5 mg total) by mouth 2 (two) times daily with a meal.   clonazePAM  (KLONOPIN ) 0.25 MG disintegrating tablet Take 1 tablet (0.25 mg total) by mouth at bedtime.   clonazePAM  (KLONOPIN ) 0.5 MG tablet TAKE ONE TABLET (0.5 MG) BY MOUTH AT BEDTIME   Continuous Glucose Sensor (DEXCOM G6 SENSOR) MISC Use to monitor  blood sugar, change after 10 days   Continuous Glucose Transmitter (DEXCOM G6 TRANSMITTER) MISC Use as directed for 90 days   dicyclomine  (BENTYL ) 20 MG tablet Take 1 tablet (20 mg total) by mouth 2 (two) times daily as needed. (Patient taking differently: Take 20 mg by mouth 2 (two) times daily as needed for spasms.)   divalproex  (DEPAKOTE  ER) 500 MG 24 hr tablet Take 1 tablet (500 mg total) by mouth at bedtime.   DULoxetine  (CYMBALTA ) 60 MG capsule Take 2 capsules (120 mg total) by mouth daily.   empagliflozin  (JARDIANCE ) 25 MG TABS tablet Take 1 tablet (25 mg total) by mouth daily with breakfast.   EPINEPHrine  (EPIPEN  2-PAK) 0.3 mg/0.3 mL IJ SOAJ injection Inject 0.3 mLs (0.3 mg total) into the muscle once as needed for up to 1 dose for anaphylaxis.   gabapentin  (NEURONTIN ) 600 MG tablet Take 1 tablet (600 mg total) by mouth 2 (two) times daily.   Insulin  Disposable Pump (OMNIPOD 5 DEXG7G6 PODS GEN 5) MISC Use as directed every 3 (three) days. (Patient not taking: Reported on 05/11/2024)   insulin  lispro (HUMALOG ) 100 UNIT/ML injection Use up to 60 Units daily in pump   Insulin  Syringe-Needle U-100 (INSULIN  SYRINGE 1CC/31GX5/16) 31G X 5/16 1 ML MISC Use to administer Humalog  3 times a day   isosorbide  mononitrate (IMDUR ) 120 MG 24 hr tablet Take 1 tablet (120 mg total) by mouth daily.   melatonin 5 MG TABS Take 1 tablet (5 mg total) by mouth at bedtime.   metFORMIN  (GLUCOPHAGE ) 500 MG tablet TAKE ONE TABLET (500 MG TOTAL) BY MOUTH TWICE DAILY AT 9AM & 5PM   mupirocin  ointment (BACTROBAN ) 2 % Apply 1 Application topically 2 (two) times daily.   nitroGLYCERIN  (NITROSTAT ) 0.4 MG SL tablet Place 1 tablet (0.4 mg total) under the tongue every 5 (five) minutes for 3 doses as needed for chest pain.   pantoprazole  (PROTONIX ) 40 MG tablet Take 1 tablet (40 mg total) by mouth at bedtime.   Rimegepant Sulfate  (NURTEC) 75 MG TBDP Take 1 tablet (75 mg total) by mouth every other day.   SKYRIZI PEN 150  MG/ML pen As  directed   Facility-Administered Encounter Medications as of 05/16/2024  Medication   botulinum toxin Type A  (BOTOX ) injection 155 Units   Hearing/Vision screen Hearing Screening - Comments:: Denies hearing difficulties   Vision Screening - Comments:: Wears rx glasses - up to date with routine eye exams with  [facility] Immunizations and Health Maintenance Health Maintenance  Topic Date Due   Zoster Vaccines- Shingrix (1 of 2) Never done   OPHTHALMOLOGY EXAM  01/28/2024   COVID-19 Vaccine (8 - 2025-26 season) 02/28/2024   Influenza Vaccine  09/26/2024 (Originally 01/28/2024)   FOOT EXAM  10/04/2024   HEMOGLOBIN A1C  11/08/2024   Diabetic kidney evaluation - Urine ACR  01/04/2025   Diabetic kidney evaluation - eGFR measurement  01/15/2025   Medicare Annual Wellness (AWV)  05/16/2025   Colonoscopy  01/19/2028   DTaP/Tdap/Td (3 - Td or Tdap) 10/01/2031   Pneumococcal Vaccine: 50+ Years  Completed   Hepatitis C Screening  Completed   Meningococcal B Vaccine  Aged Out   Hepatitis B Vaccines 19-59 Average Risk  Discontinued        Assessment/Plan:  This is a routine wellness examination for Westen.  Patient Care Team: Amon Aloysius BRAVO, MD as PCP - General (Internal Medicine) Bernie Lamar PARAS, MD as PCP - Cardiology (Cardiology) Shari Easter, MD as Consulting Physician (Orthopedic Surgery) Marlee Bernardino NOVAK, MD as Attending Physician (Nephrology) Unice Pac, MD as Consulting Physician (Neurosurgery) Lbcardiology, Rande, MD as Rounding Team (Cardiology) Ra[facility])  I have personally reviewed and noted the following in the patient's chart:   Medical and social history Use of alcohol, tobacco or illicit drugs  Current medications and supplements including opioid prescriptions. Functional ability and status Nutritional status Physical activity Advanced directives List of other physicians Hospitalizations, surgeries, and ER visits in previous 12  months Vitals Screenings to include cognitive, depression, and falls Referrals and appointments  No orders of the defined types were placed in this encounter.  In addition, I have reviewed and discussed with patient certain preventive protocols, quality metrics, and best practice recommendations. A written personalized care plan for preventive services as well as general preventive health recommendations were provided to patient.   Rojelio LELON Blush, LPN   [id number]/81/[id number]   Return in 1 year on 05/22/25  After Visit Summary: (MyChart) Due to this being a telephonic visit, the after visit summary with patients personalized plan was offered to patient via MyChart   Nurse Notes: None

## 2024-05-17 ENCOUNTER — Other Ambulatory Visit (HOSPITAL_BASED_OUTPATIENT_CLINIC_OR_DEPARTMENT_OTHER): Payer: Self-pay

## 2024-05-18 ENCOUNTER — Other Ambulatory Visit (HOSPITAL_BASED_OUTPATIENT_CLINIC_OR_DEPARTMENT_OTHER): Payer: Self-pay

## 2024-05-18 MED ORDER — FLUZONE HIGH-DOSE 0.5 ML IM SUSY
0.5000 mL | PREFILLED_SYRINGE | Freq: Once | INTRAMUSCULAR | 0 refills | Status: AC
  Filled 2024-05-18: qty 0.5, 1d supply, fill #0

## 2024-05-18 MED ORDER — COMIRNATY 30 MCG/0.3ML IM SUSY
0.3000 mL | PREFILLED_SYRINGE | Freq: Once | INTRAMUSCULAR | 0 refills | Status: AC
  Filled 2024-05-18: qty 0.3, 1d supply, fill #0

## 2024-05-19 ENCOUNTER — Other Ambulatory Visit (HOSPITAL_BASED_OUTPATIENT_CLINIC_OR_DEPARTMENT_OTHER): Payer: Self-pay

## 2024-05-22 ENCOUNTER — Other Ambulatory Visit (HOSPITAL_BASED_OUTPATIENT_CLINIC_OR_DEPARTMENT_OTHER): Payer: Self-pay

## 2024-05-23 ENCOUNTER — Other Ambulatory Visit (HOSPITAL_BASED_OUTPATIENT_CLINIC_OR_DEPARTMENT_OTHER): Payer: Self-pay

## 2024-05-24 ENCOUNTER — Other Ambulatory Visit (HOSPITAL_BASED_OUTPATIENT_CLINIC_OR_DEPARTMENT_OTHER): Payer: Self-pay

## 2024-05-24 ENCOUNTER — Other Ambulatory Visit: Payer: Self-pay | Admitting: Internal Medicine

## 2024-05-26 ENCOUNTER — Other Ambulatory Visit (HOSPITAL_BASED_OUTPATIENT_CLINIC_OR_DEPARTMENT_OTHER): Payer: Self-pay

## 2024-05-27 ENCOUNTER — Other Ambulatory Visit (HOSPITAL_BASED_OUTPATIENT_CLINIC_OR_DEPARTMENT_OTHER): Payer: Self-pay

## 2024-05-29 ENCOUNTER — Other Ambulatory Visit (HOSPITAL_BASED_OUTPATIENT_CLINIC_OR_DEPARTMENT_OTHER): Payer: Self-pay

## 2024-05-30 ENCOUNTER — Telehealth: Payer: Self-pay | Admitting: *Deleted

## 2024-05-30 ENCOUNTER — Other Ambulatory Visit (HOSPITAL_BASED_OUTPATIENT_CLINIC_OR_DEPARTMENT_OTHER): Payer: Self-pay

## 2024-05-31 ENCOUNTER — Other Ambulatory Visit (HOSPITAL_BASED_OUTPATIENT_CLINIC_OR_DEPARTMENT_OTHER): Payer: Self-pay

## 2024-06-01 ENCOUNTER — Other Ambulatory Visit (HOSPITAL_BASED_OUTPATIENT_CLINIC_OR_DEPARTMENT_OTHER): Payer: Self-pay

## 2024-06-02 ENCOUNTER — Other Ambulatory Visit (HOSPITAL_BASED_OUTPATIENT_CLINIC_OR_DEPARTMENT_OTHER): Payer: Self-pay

## 2024-06-05 ENCOUNTER — Other Ambulatory Visit (HOSPITAL_BASED_OUTPATIENT_CLINIC_OR_DEPARTMENT_OTHER): Payer: Self-pay

## 2024-06-06 ENCOUNTER — Other Ambulatory Visit (HOSPITAL_BASED_OUTPATIENT_CLINIC_OR_DEPARTMENT_OTHER): Payer: Self-pay

## 2024-06-07 ENCOUNTER — Other Ambulatory Visit (HOSPITAL_BASED_OUTPATIENT_CLINIC_OR_DEPARTMENT_OTHER): Payer: Self-pay

## 2024-06-08 ENCOUNTER — Other Ambulatory Visit (HOSPITAL_BASED_OUTPATIENT_CLINIC_OR_DEPARTMENT_OTHER): Payer: Self-pay

## 2024-06-09 ENCOUNTER — Other Ambulatory Visit (HOSPITAL_BASED_OUTPATIENT_CLINIC_OR_DEPARTMENT_OTHER): Payer: Self-pay

## 2024-06-14 ENCOUNTER — Other Ambulatory Visit (HOSPITAL_BASED_OUTPATIENT_CLINIC_OR_DEPARTMENT_OTHER): Payer: Self-pay

## 2024-06-16 ENCOUNTER — Other Ambulatory Visit (HOSPITAL_BASED_OUTPATIENT_CLINIC_OR_DEPARTMENT_OTHER): Payer: Self-pay

## 2024-06-19 ENCOUNTER — Telehealth: Payer: Self-pay

## 2024-06-19 ENCOUNTER — Other Ambulatory Visit (HOSPITAL_COMMUNITY): Payer: Self-pay

## 2024-06-19 NOTE — Telephone Encounter (Signed)
 Pharmacy Patient Advocate Encounter   Received notification from CoverMyMeds that prior authorization for Nurtec is required/requested.   Insurance verification completed.   The patient is insured through [facility].   Per test claim: PA required; PA started via CoverMyMeds. KEY [id number] . Waiting for clinical questions to populate.

## 2024-06-20 NOTE — Telephone Encounter (Signed)
 Hello Prescriber!  We are in the process of submitting a prior authorization for your patient for Nurtec. We are reaching out for clinical guidance for the following information to complete the request: Plan requiring documentation of clinical benefit since starting therapy.   Thank you! Pharmacy Team

## 2024-06-21 ENCOUNTER — Other Ambulatory Visit (HOSPITAL_BASED_OUTPATIENT_CLINIC_OR_DEPARTMENT_OTHER): Payer: Self-pay

## 2024-06-23 ENCOUNTER — Other Ambulatory Visit (HOSPITAL_BASED_OUTPATIENT_CLINIC_OR_DEPARTMENT_OTHER): Payer: Self-pay

## 2024-06-27 ENCOUNTER — Other Ambulatory Visit (HOSPITAL_BASED_OUTPATIENT_CLINIC_OR_DEPARTMENT_OTHER): Payer: Self-pay

## 2024-06-28 NOTE — Telephone Encounter (Signed)
 No answer unable to lvm 1st attempt by hf 06/28/24

## 2024-06-28 NOTE — Telephone Encounter (Signed)
 Clinical questions have expired-pt has not responded to clinic-will archive PA CMM KEY.

## 2024-07-03 NOTE — Telephone Encounter (Signed)
 Called [phone] and got msg that the number has been changed or no longer inservice

## 2024-07-03 NOTE — Telephone Encounter (Signed)
 Also called [phone] and that isn't in service either

## 2024-07-03 NOTE — Telephone Encounter (Signed)
 Called and lvm on wife phone number [phone]. 3rd and final attempt

## 2024-07-22 ENCOUNTER — Other Ambulatory Visit: Payer: Self-pay | Admitting: Endocrinology

## 2024-07-22 DIAGNOSIS — E1165 Type 2 diabetes mellitus with hyperglycemia: Secondary | ICD-10-CM

## 2024-07-24 ENCOUNTER — Other Ambulatory Visit: Payer: Self-pay

## 2024-08-02 ENCOUNTER — Other Ambulatory Visit: Payer: Self-pay | Admitting: Cardiology

## 2024-08-11 ENCOUNTER — Ambulatory Visit: Admitting: Endocrinology

## 2024-09-06 ENCOUNTER — Ambulatory Visit: Admitting: Neurology

## 2025-05-22 ENCOUNTER — Ambulatory Visit
